# Patient Record
Sex: Male | Born: 1955 | Race: Black or African American | Hispanic: No | Marital: Single | Smoking: Former smoker
Health system: Southern US, Community
[De-identification: ages and names within clinical notes are randomized; demographics above are authoritative.]

## PROBLEM LIST (undated history)

## (undated) DIAGNOSIS — I5020 Unspecified systolic (congestive) heart failure: Secondary | ICD-10-CM

## (undated) DIAGNOSIS — J45909 Unspecified asthma, uncomplicated: Secondary | ICD-10-CM

## (undated) DIAGNOSIS — F1010 Alcohol abuse, uncomplicated: Secondary | ICD-10-CM

## (undated) DIAGNOSIS — D649 Anemia, unspecified: Secondary | ICD-10-CM

## (undated) DIAGNOSIS — M109 Gout, unspecified: Secondary | ICD-10-CM

## (undated) DIAGNOSIS — E785 Hyperlipidemia, unspecified: Secondary | ICD-10-CM

## (undated) DIAGNOSIS — Z720 Tobacco use: Secondary | ICD-10-CM

## (undated) DIAGNOSIS — K922 Gastrointestinal hemorrhage, unspecified: Secondary | ICD-10-CM

## (undated) DIAGNOSIS — I426 Alcoholic cardiomyopathy: Secondary | ICD-10-CM

## (undated) DIAGNOSIS — I428 Other cardiomyopathies: Secondary | ICD-10-CM

## (undated) DIAGNOSIS — M1990 Unspecified osteoarthritis, unspecified site: Secondary | ICD-10-CM

## (undated) DIAGNOSIS — I2510 Atherosclerotic heart disease of native coronary artery without angina pectoris: Secondary | ICD-10-CM

## (undated) DIAGNOSIS — D689 Coagulation defect, unspecified: Secondary | ICD-10-CM

## (undated) DIAGNOSIS — T7840XA Allergy, unspecified, initial encounter: Secondary | ICD-10-CM

## (undated) DIAGNOSIS — H547 Unspecified visual loss: Secondary | ICD-10-CM

## (undated) DIAGNOSIS — I10 Essential (primary) hypertension: Secondary | ICD-10-CM

## (undated) DIAGNOSIS — G459 Transient cerebral ischemic attack, unspecified: Secondary | ICD-10-CM

## (undated) DIAGNOSIS — I5022 Chronic systolic (congestive) heart failure: Secondary | ICD-10-CM

## (undated) DIAGNOSIS — H3412 Central retinal artery occlusion, left eye: Secondary | ICD-10-CM

## (undated) DIAGNOSIS — E119 Type 2 diabetes mellitus without complications: Secondary | ICD-10-CM

## (undated) DIAGNOSIS — J189 Pneumonia, unspecified organism: Secondary | ICD-10-CM

## (undated) HISTORY — PX: COLONOSCOPY: SHX174

## (undated) HISTORY — DX: Atherosclerotic heart disease of native coronary artery without angina pectoris: I25.10

## (undated) HISTORY — DX: Allergy, unspecified, initial encounter: T78.40XA

## (undated) HISTORY — DX: Coagulation defect, unspecified: D68.9

## (undated) HISTORY — DX: Transient cerebral ischemic attack, unspecified: G45.9

## (undated) HISTORY — DX: Unspecified systolic (congestive) heart failure: I50.20

## (undated) HISTORY — DX: Chronic systolic (congestive) heart failure: I50.22

## (undated) HISTORY — PX: CARDIAC CATHETERIZATION: SHX172

## (undated) HISTORY — DX: Essential (primary) hypertension: I10

---

## 1997-09-16 ENCOUNTER — Emergency Department (HOSPITAL_COMMUNITY): Admission: EM | Admit: 1997-09-16 | Discharge: 1997-09-16 | Payer: Self-pay | Admitting: Emergency Medicine

## 1997-11-01 ENCOUNTER — Emergency Department (HOSPITAL_COMMUNITY): Admission: EM | Admit: 1997-11-01 | Discharge: 1997-11-01 | Payer: Self-pay | Admitting: Emergency Medicine

## 1997-11-29 ENCOUNTER — Emergency Department (HOSPITAL_COMMUNITY): Admission: EM | Admit: 1997-11-29 | Discharge: 1997-11-29 | Payer: Self-pay | Admitting: Emergency Medicine

## 1998-01-20 ENCOUNTER — Emergency Department (HOSPITAL_COMMUNITY): Admission: EM | Admit: 1998-01-20 | Discharge: 1998-01-20 | Payer: Self-pay | Admitting: Emergency Medicine

## 1999-03-29 ENCOUNTER — Emergency Department (HOSPITAL_COMMUNITY): Admission: EM | Admit: 1999-03-29 | Discharge: 1999-03-29 | Payer: Self-pay | Admitting: Emergency Medicine

## 1999-04-20 ENCOUNTER — Emergency Department (HOSPITAL_COMMUNITY): Admission: EM | Admit: 1999-04-20 | Discharge: 1999-04-20 | Payer: Self-pay | Admitting: Emergency Medicine

## 1999-04-20 ENCOUNTER — Encounter: Payer: Self-pay | Admitting: Emergency Medicine

## 2001-09-10 ENCOUNTER — Emergency Department (HOSPITAL_COMMUNITY): Admission: EM | Admit: 2001-09-10 | Discharge: 2001-09-10 | Payer: Self-pay | Admitting: Emergency Medicine

## 2001-09-10 ENCOUNTER — Encounter: Payer: Self-pay | Admitting: Emergency Medicine

## 2001-09-18 ENCOUNTER — Emergency Department (HOSPITAL_COMMUNITY): Admission: EM | Admit: 2001-09-18 | Discharge: 2001-09-18 | Payer: Self-pay | Admitting: Emergency Medicine

## 2001-10-03 ENCOUNTER — Encounter: Admission: RE | Admit: 2001-10-03 | Discharge: 2001-11-14 | Payer: Self-pay | Admitting: Orthopedic Surgery

## 2002-02-04 ENCOUNTER — Emergency Department (HOSPITAL_COMMUNITY): Admission: EM | Admit: 2002-02-04 | Discharge: 2002-02-04 | Payer: Self-pay | Admitting: Emergency Medicine

## 2004-06-19 ENCOUNTER — Emergency Department (HOSPITAL_COMMUNITY): Admission: EM | Admit: 2004-06-19 | Discharge: 2004-06-19 | Payer: Self-pay | Admitting: Emergency Medicine

## 2004-07-12 ENCOUNTER — Emergency Department (HOSPITAL_COMMUNITY): Admission: EM | Admit: 2004-07-12 | Discharge: 2004-07-12 | Payer: Self-pay | Admitting: Emergency Medicine

## 2004-10-04 ENCOUNTER — Emergency Department (HOSPITAL_COMMUNITY): Admission: EM | Admit: 2004-10-04 | Discharge: 2004-10-04 | Payer: Self-pay | Admitting: Emergency Medicine

## 2005-03-28 ENCOUNTER — Emergency Department (HOSPITAL_COMMUNITY): Admission: EM | Admit: 2005-03-28 | Discharge: 2005-03-28 | Payer: Self-pay | Admitting: Emergency Medicine

## 2005-11-30 ENCOUNTER — Emergency Department (HOSPITAL_COMMUNITY): Admission: EM | Admit: 2005-11-30 | Discharge: 2005-11-30 | Payer: Self-pay | Admitting: Emergency Medicine

## 2007-01-30 ENCOUNTER — Emergency Department (HOSPITAL_COMMUNITY): Admission: EM | Admit: 2007-01-30 | Discharge: 2007-01-30 | Payer: Self-pay | Admitting: Emergency Medicine

## 2007-05-21 ENCOUNTER — Emergency Department (HOSPITAL_COMMUNITY): Admission: EM | Admit: 2007-05-21 | Discharge: 2007-05-21 | Payer: Self-pay | Admitting: Emergency Medicine

## 2007-08-26 ENCOUNTER — Emergency Department (HOSPITAL_COMMUNITY): Admission: EM | Admit: 2007-08-26 | Discharge: 2007-08-26 | Payer: Self-pay | Admitting: Emergency Medicine

## 2008-10-07 ENCOUNTER — Inpatient Hospital Stay (HOSPITAL_COMMUNITY): Admission: EM | Admit: 2008-10-07 | Discharge: 2008-10-10 | Payer: Self-pay | Admitting: Emergency Medicine

## 2008-10-07 ENCOUNTER — Ambulatory Visit: Payer: Self-pay | Admitting: Internal Medicine

## 2008-10-08 ENCOUNTER — Encounter: Payer: Self-pay | Admitting: Cardiology

## 2008-10-22 ENCOUNTER — Encounter (INDEPENDENT_AMBULATORY_CARE_PROVIDER_SITE_OTHER): Payer: Self-pay | Admitting: *Deleted

## 2008-11-30 DIAGNOSIS — N259 Disorder resulting from impaired renal tubular function, unspecified: Secondary | ICD-10-CM | POA: Insufficient documentation

## 2008-11-30 DIAGNOSIS — D509 Iron deficiency anemia, unspecified: Secondary | ICD-10-CM | POA: Insufficient documentation

## 2008-11-30 DIAGNOSIS — M109 Gout, unspecified: Secondary | ICD-10-CM | POA: Insufficient documentation

## 2008-11-30 DIAGNOSIS — R079 Chest pain, unspecified: Secondary | ICD-10-CM | POA: Insufficient documentation

## 2008-11-30 DIAGNOSIS — R7309 Other abnormal glucose: Secondary | ICD-10-CM | POA: Insufficient documentation

## 2008-11-30 DIAGNOSIS — E119 Type 2 diabetes mellitus without complications: Secondary | ICD-10-CM | POA: Insufficient documentation

## 2008-11-30 DIAGNOSIS — E785 Hyperlipidemia, unspecified: Secondary | ICD-10-CM | POA: Insufficient documentation

## 2008-12-02 ENCOUNTER — Telehealth (INDEPENDENT_AMBULATORY_CARE_PROVIDER_SITE_OTHER): Payer: Self-pay | Admitting: *Deleted

## 2008-12-31 ENCOUNTER — Encounter (INDEPENDENT_AMBULATORY_CARE_PROVIDER_SITE_OTHER): Payer: Self-pay | Admitting: *Deleted

## 2009-03-02 ENCOUNTER — Emergency Department (HOSPITAL_COMMUNITY): Admission: EM | Admit: 2009-03-02 | Discharge: 2009-03-02 | Payer: Self-pay | Admitting: Emergency Medicine

## 2010-06-27 LAB — DIFFERENTIAL
Basophils Relative: 1 % (ref 0–1)
Eosinophils Absolute: 0.2 10*3/uL (ref 0.0–0.7)
Eosinophils Relative: 3 % (ref 0–5)
Lymphocytes Relative: 16 % (ref 12–46)
Lymphs Abs: 1.3 10*3/uL (ref 0.7–4.0)
Monocytes Absolute: 0.5 10*3/uL (ref 0.1–1.0)
Monocytes Relative: 6 % (ref 3–12)
Neutro Abs: 6 10*3/uL (ref 1.7–7.7)
Neutrophils Relative %: 74 % (ref 43–77)

## 2010-06-27 LAB — COMPREHENSIVE METABOLIC PANEL
AST: 44 U/L — ABNORMAL HIGH (ref 0–37)
Alkaline Phosphatase: 96 U/L (ref 39–117)
Chloride: 100 mEq/L (ref 96–112)
GFR calc Af Amer: >60 mL/min (ref >60)
Potassium: 4.7 mEq/L (ref 3.5–5.1)
Sodium: 136 mEq/L (ref 135–145)

## 2010-06-27 LAB — CBC
HCT: 37.7 % — ABNORMAL LOW (ref 39.0–52.0)
MCHC: 34.2 g/dL (ref 30.0–36.0)
MCV: 99 fL (ref 78.0–100.0)
RBC: 3.81 MIL/uL — ABNORMAL LOW (ref 4.22–5.81)
RDW: 17 % — ABNORMAL HIGH (ref 11.5–15.5)
WBC: 8.1 10*3/uL (ref 4.0–10.5)

## 2010-06-27 LAB — URINALYSIS, ROUTINE W REFLEX MICROSCOPIC
Bilirubin Urine: NEGATIVE
Glucose, UA: NEGATIVE mg/dL
Protein, ur: NEGATIVE mg/dL
pH: 7.5 (ref 5.0–8.0)

## 2010-06-27 LAB — GLUCOSE, CAPILLARY: Glucose-Capillary: 138 mg/dL — ABNORMAL HIGH (ref 70–99)

## 2010-06-27 LAB — URINE CULTURE: Culture: NO GROWTH

## 2010-06-27 LAB — PROTIME-INR: Prothrombin Time: 12.9 seconds (ref 11.6–15.2)

## 2010-07-02 LAB — CBC
HCT: 33.6 % — ABNORMAL LOW (ref 39.0–52.0)
MCV: 102.2 fL — ABNORMAL HIGH (ref 78.0–100.0)
Platelets: 213 10*3/uL (ref 150–400)
RDW: 17 % — ABNORMAL HIGH (ref 11.5–15.5)

## 2010-07-02 LAB — COMPREHENSIVE METABOLIC PANEL
Albumin: 3.3 g/dL — ABNORMAL LOW (ref 3.5–5.2)
BUN: 11 mg/dL (ref 6–23)
Creatinine, Ser: 0.86 mg/dL (ref 0.4–1.5)
Potassium: 4.2 mEq/L (ref 3.5–5.1)
Total Protein: 6.3 g/dL (ref 6.0–8.3)

## 2010-07-02 LAB — GLUCOSE, CAPILLARY
Glucose-Capillary: 117 mg/dL — ABNORMAL HIGH (ref 70–99)
Glucose-Capillary: 122 mg/dL — ABNORMAL HIGH (ref 70–99)
Glucose-Capillary: 134 mg/dL — ABNORMAL HIGH (ref 70–99)
Glucose-Capillary: 145 mg/dL — ABNORMAL HIGH (ref 70–99)
Glucose-Capillary: 145 mg/dL — ABNORMAL HIGH (ref 70–99)
Glucose-Capillary: 166 mg/dL — ABNORMAL HIGH (ref 70–99)

## 2010-07-02 LAB — CARDIAC PANEL(CRET KIN+CKTOT+MB+TROPI)
CK, MB: 0.8 ng/mL (ref 0.3–4.0)
Relative Index: INVALID (ref 0.0–2.5)
Troponin I: 0.02 ng/mL (ref 0.00–0.06)
Troponin I: 0.02 ng/mL (ref 0.00–0.06)

## 2010-07-02 LAB — LIPID PANEL
HDL: 63 mg/dL (ref >39)
Triglycerides: 451 mg/dL — ABNORMAL HIGH (ref <150)

## 2010-07-03 LAB — DIFFERENTIAL
Basophils Absolute: 0 10*3/uL (ref 0.0–0.1)
Lymphocytes Relative: 20 % (ref 12–46)
Monocytes Absolute: 0.4 10*3/uL (ref 0.1–1.0)
Monocytes Relative: 6 % (ref 3–12)
Neutro Abs: 5.2 10*3/uL (ref 1.7–7.7)

## 2010-07-03 LAB — COMPREHENSIVE METABOLIC PANEL
Albumin: 4.5 g/dL (ref 3.5–5.2)
BUN: 20 mg/dL (ref 6–23)
Creatinine, Ser: 1.52 mg/dL — ABNORMAL HIGH (ref 0.4–1.5)
Total Bilirubin: 0.9 mg/dL (ref 0.3–1.2)
Total Protein: 8.1 g/dL (ref 6.0–8.3)

## 2010-07-03 LAB — CK TOTAL AND CKMB (NOT AT ARMC)
CK, MB: 1.1 ng/mL (ref 0.3–4.0)
Relative Index: 1 (ref 0.0–2.5)
Total CK: 107 U/L (ref 7–232)

## 2010-07-03 LAB — POCT CARDIAC MARKERS
CKMB, poc: 1.1 ng/mL (ref 1.0–8.0)
Myoglobin, poc: 120 ng/mL (ref 12–200)
Myoglobin, poc: 72.7 ng/mL (ref 12–200)

## 2010-07-03 LAB — IRON AND TIBC
Iron: 31 ug/dL — ABNORMAL LOW (ref 42–135)
TIBC: 282 ug/dL (ref 215–435)

## 2010-07-03 LAB — CBC
HCT: 39.1 % (ref 39.0–52.0)
MCV: 103 fL — ABNORMAL HIGH (ref 78.0–100.0)
Platelets: 224 10*3/uL (ref 150–400)
RDW: 17.2 % — ABNORMAL HIGH (ref 11.5–15.5)

## 2010-07-03 LAB — TROPONIN I: Troponin I: 0.02 ng/mL (ref 0.00–0.06)

## 2010-07-03 LAB — FERRITIN: Ferritin: 1332 ng/mL — ABNORMAL HIGH (ref 22–322)

## 2010-07-03 LAB — RETICULOCYTES
RBC.: 3.65 MIL/uL — ABNORMAL LOW (ref 4.22–5.81)
Retic Count, Absolute: 18.3 10*3/uL — ABNORMAL LOW (ref 19.0–186.0)
Retic Ct Pct: 0.5 % (ref 0.4–3.1)

## 2010-08-08 NOTE — Consult Note (Signed)
NAMELEIGHLAND, MISFELDT NO.:  [id number]   MEDICAL RECORD NO.:  [id number]          PATIENT TYPE:  INP   LOCATION:  [id number]                         FACILITY:  [facility]   PHYSICIAN:  Thompson Grayer, MD       DATE OF BIRTH:  21-Aug-1955   DATE OF CONSULTATION:  DATE OF DISCHARGE:                                 CONSULTATION   CONSULTING PHYSICIAN:  Hospitalist Service.   REASON FOR CONSULTATION:  Chest pain.   HISTORY OF PRESENT ILLNESS:  Mr. Viegas is a pleasant 55 year old  gentleman without significant prior medical history who presents with  chest and epigastric discomforts.  The patient has a long-standing  history of tobacco and significant alcohol consumption, but denies prior  hypertension, hyperlipidemia, and diabetes.  He denies any prior cardiac  history.  He notes that last evening that he developed a epigastric  chest discomfort after eating ravioli.  He took Tums and notes that his  pain resolved.  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  He reports associated blurred vision and headache.  His  symptoms persisted for approximately an hour before gradually subsiding.  He presented to [facility] for further evaluation.  Presently, he is resting comfortably and without complaint.  He denies  chest pain presently, recent shortness of breath, palpitations,  presyncope, syncope, or other symptoms.   PAST MEDICAL HISTORY:  1. Longstanding tobacco use.  2. Heavy alcohol consumption with intermittent symptoms of withdrawal.   PAST SURGICAL HISTORY:  None.   MEDICATIONS:  None.   ALLERGIES:  None.   SOCIAL HISTORY:  The patient lives in [place] with his nephew.  He is  a Horticulturist, commercial.  He smokes a pack per day and has done so for many years.  He drinks several beers per day and drinks wine and whiskey on the  weekends.  He denies drug use.   FAMILY HISTORY:  Notable for diabetes.  He is unaware of any family  history of early coronary artery disease.   REVIEW OF SYSTEMS:  All systems are reviewed and negative except as  outlined in the HPI above.   PHYSICAL EXAMINATION:  VITAL SIGNS:  Blood pressure 124/93, heart rate  100, respirations 18, sats 100% on room air, and afebrile.  Telemetry  reveals sinus rhythm.  GENERAL:  The patient is anxious-appearing male in no acute distress.  He is alert and oriented x3.  HEENT:  Normocephalic and atraumatic.  Sclerae clear.  Conjunctivae  pink.  Oropharynx is clear.  NECK:  Supple.  No thyromegaly, JVD, or bruits.  LUNGS:  Clear to auscultation bilaterally with a mildly prolonged  expiratory phase.  HEART:  Regular rate and rhythm.  Bilaterally displaced PMI.  No  murmurs, rubs, or gallops.  GI:  Soft, nontender, and nondistended.  Positive bowel sounds.  EXTREMITIES:  No clubbing, cyanosis, or edema.  NEUROLOGIC:  Strength and sensation are intact.  SKIN:  No ecchymosis or laceration.  MUSCULOSKELETAL:  No deformity  or atrophy.  PSYCH:  Anxious-appearing mood.  Full affect.   EKG today reveals sinus tachycardia at 110 beats per minute with T-wave  inversions along the inferior leads as well as V5 and V6.  These changes  are very similar to an EKG from November 30, 2005.   Chest x-ray, I have reviewed the patient's chest x-ray, which appears to  be normal at this time.   LABORATORY DATA:  Troponin 0.02.  CK-MB 1.1, CK 107, lipase 40, and  creatinine 1.5.  Hematocrit 39, MCV 103, white blood cell count 7, and  platelets 224.   IMPRESSION:  Mr. Fadely is a very pleasant 55 year old gentleman who  presents with epigastric and substernal chest discomfort earlier today,  which has now resolved.  He reports mild nausea with these episodes and  notes that his episode last evening terminated with Tums.  He is  presently resting comfortably.  The etiology of his chest discomfort and   epigastric pain is unknown.  Given his heavy history of alcohol  consumption, I am concerned that this may be gastrointestinal in  etiology.  I will therefore recommend placing the patient on a proton  pump inhibitor.  The patient has been admitted to the hospitalist  service and will be observed overnight on telemetry with serial cardiac  markers.  If he has no evidence of subendocardial ischemia, then I would  recommend a GXT Myoview.  If the patient rules in for myocardial  infarction, then I will consider left heart catheterization after his  renal function has been optimized.  The importance of cessation of  tobacco and alcohol was discussed in detail with the patient today.  We  will obtain a fasting lipid profile and hemoglobin A1c for further risk  stratification.      Thompson Grayer, MD  Electronically Signed     JA/MEDQ  D:  10/07/2008  T:  10/08/2008  Job:  [id number]

## 2010-08-08 NOTE — Discharge Summary (Signed)
Travis [name], Travis [name] NO.:  [id number]   MEDICAL RECORD NO.:  [id number]          PATIENT TYPE:  INP   LOCATION:  3709                         FACILITY:  [facility]   PHYSICIAN:  Rexene Alberts, M.D.    DATE OF BIRTH:  [birth year]   DATE OF ADMISSION:  10/07/2008  DATE OF DISCHARGE:  10/10/2008                               DISCHARGE SUMMARY   DISCHARGE DIAGNOSES:  1. Chest pain with EKG changes.  The patient ruled out for myocardial      infarction.  Lateral T-wave changes seen on the EKG noted.  2. Abnormal Myoview stress test performed on October 10, 2008.  The      results were significant for a fixed defect along the inferior wall      suggestive of an infarct.  There was hypokinesia along the inferior      wall and the apex.  There was no evidence for a reversible defect      or ischemia.  Ejection fraction measured 40%.  3. Per the 2-D echocardiogram performed on October 08, 2008, there was      diastolic dysfunction, mild to moderate LVH, normal wall motion of      the left ventricle, and an ejection fraction of 65%.  4. Hyperlipidemia.  5. Newly diagnosed type 2 diabetes mellitus.  6. Macrocytosis secondary to alcohol abuse.  Vitamin B12 and folate      levels were within normal limits.  7. Elevated SGOT, likely secondary to alcohol abuse.  8. Acute renal insufficiency, completely resolved.  The patient's      creatinine was 1.52 on admission and prior to hospital discharge,      it improved to 0.86 with IV fluids.  9. Tobacco and alcohol abuse.   DISCHARGE MEDICATIONS:  1. Glipizide 5 mg half a tablet daily.  2. Pravastatin 20 mg daily.  3. Sublingual nitroglycerin 1 tablet under the tongue as needed every      5 minutes x3 for chest pain.  4. Aspirin 81 mg daily.   DISCHARGE DISPOSITION:  The patient is being discharged to home in  improved and stable condition.  Cardiologist, Dr. Percival Spanish stated that  the [facility] will call him for a  followup appointment.  The patient was given information on the [facility] and he was  advised to call to make an appointment to be followed up in 2-4 weeks.   CONSULTATIONS:  [facility] cardiologists, Dr. Rayann Heman and Dr. Percival Spanish.   PROCEDURES PERFORMED:  1. Myoview stress test on October 10, 2008.  The results are indicated      above.  In addition, during the stress test, there was no evidence      of ST or T-wave changes indicative of ischemia.  2. A 2-D echocardiogram performed on October 08, 2008.  The results are      indicated above.  3. Chest x-ray on October 07, 2008.  The results revealed no evidence of      cardiopulmonary disease acutely.   HISTORY OF PRESENT ILLNESS:  The patient is a 55 year old man  with a  past medical history significant for tobacco and alcohol use, remote  history of an upper GI bleed, and degenerative joint disease, who  presented to the emergency department on October 07, 2008 with a chief  complaint of chest pain while at work.  The patient is employed as a  Horticulturist, commercial.  When he was evaluated in the emergency department, he was  noted to be afebrile and hemodynamically stable.  His initial cardiac  markers were negative.  His blood lipase was within normal limits at 40.  His EKG revealed sinus tachycardia with a heart rate of 107 beats per  minute, LVH, and lateral T-wave abnormalities.  His chest x-ray revealed  no acute cardiopulmonary disease.  He was admitted for further  evaluation and management.   For additional details, please see the dictated history and physical.   HOSPITAL COURSE:  1. CHEST PAIN, ABNORMAL EKG, ABNORMAL STRESS TEST, AND HYPERLIPIDEMIA.      The patient was started empirically on nitroglycerin paste,      aspirin, and prophylactic Protonix.  In addition, his pain was      treated with as needed Percocet.  The patient had no prior history      of hypertension.  His blood pressures remained controlled      throughout the  hospitalization.  For further evaluation, a number      of studies were ordered including cardiac enzymes, fasting lipid      panel, TSH, and 2-D echocardiogram.  The cardiac enzymes were      completely normal.  His TSH was within normal limits at 0.686.  His      fasting lipid profile revealed a total cholesterol of 205,      triglycerides of 451, and HDL cholesterol of 63.  The LDL      cholesterol was not calculated.  He was started on Zocor daily.      His 2-D echocardiogram revealed preserved LV function with an      ejection fraction of 65%.;  mild to moderate LVH; however, the left      ventricular wall motion was normal; there was no evidence of      regional wall motion abnormalities.  Subsequently, cardiologist,      Dr. Rayann Heman was consulted.  He provided his assessment shortly after      hospital admission.  Per his impression, the patient's discomfort      may have been gastrointestinal in origin.  He recommended obtaining      a Myoview stress test if there was no evidence of subendocardial      ischemia with negative cardiac enzymes.  However, if the patient      ruled in for a myocardial infarction, a left heart catheterization      would be considered.  He noted that the patient's renal function      was abnormal and once it improved, a catheterization would      potentially be pursued.  As above, the patient ruled out for a      myocardial infarction.  Therefore, a Myoview stress test was      ordered and it was performed today.  The results are indicated      above.  Given these findings today, I discussed the patient's      disposition with cardiologist, Dr. Percival Spanish.  He reviewed the      stress test and stated that there was no evidence of reversible  ischemia and therefore, the patient could be safely discharged to      home.  Dr. Percival Spanish also stated that the office would call the      patient for a followup assessment with Dr. Rayann Heman in the next 1-2       weeks.  The patient was given the information.  2. MACROCYTIC ANEMIA, MILD TRANSAMINITIS, TOBACCO ABUSE, AND ALCOHOL      ABUSE.  At the time of the initial hospital assessment, the      patient's SGOT was elevated at 74.  His hemoglobin was 13.6 and his      MCV was 103.  His hemoglobin fell slightly to 11.7.  His SGOT      improved slightly to 68.  The patient admitted drinking one six-      pack or more of beer daily and smoking at least one pack of      cigarettes daily.  He was strongly advised to stop both.  He was      offered tobacco cessation counseling and services to assist with      alcohol cessation.  The patient vowed to cut down on his tobacco      and alcohol use; however, he did say that it will be difficult for      him to stop both completely at the same time.  He was treated      empirically with an Ativan taper as well as vitamin therapy.  A      nicotine patch was placed as well.  There were no overt signs of      alcohol withdrawal during the hospital course.  3. NEWLY DIAGNOSED TYPE 2 DIABETES MELLITUS.  The patient's venous      glucose was consistently elevated during the hospitalization.  A      hemoglobin A1c was ordered and it was 5.7.  His capillary blood      glucose was monitored throughout the hospitalization.  Given the      persistent elevation, glipizide was started at      2.5 mg daily.  The registered nurse and registered dietician were      consulted for diabetes management.  He was advised to check and      monitor his blood glucose at least twice daily at home.  Further      management will be deferred to his new primary care physician at      the [facility].      Rexene Alberts, M.D.  Electronically Signed     DF/MEDQ  D:  10/10/2008  T:  10/11/2008  Job:  [id number]   cc:   Myriam Jacobson  Thompson Grayer, MD

## 2010-08-08 NOTE — H&P (Signed)
Travis [name], Travis [name] NO.:  [id number]   MEDICAL RECORD NO.:  [id number]          PATIENT TYPE:  EMS   LOCATION:  MAJO                         FACILITY:  [facility]   PHYSICIAN:  Rise Patience, MDDATE OF BIRTH:  Dec 04, 1955   DATE OF ADMISSION:  10/07/2008  DATE OF DISCHARGE:                              HISTORY & PHYSICAL   PRIMARY CARE PHYSICIAN:  Unassigned.   CHIEF COMPLAINT:  Chest pain.   HISTORY OF PRESENT ILLNESS:  A 55 year old male with history of ongoing  tobacco abuse presented to the ER complaining of chest pain.  The  patient started experiencing chest pain after work yesterday at around 6  p.m.  The patient is a Horticulturist, commercial.  The pain is inferior to sternum  sometimes radiating to left arm.  Initially had some sweats along with  the chest pain.  Denies any shortness of breath.  Denies any nausea or  vomiting, diarrhea, fever or chills, headache or loss of consciousness.  The patient's chest pain resolved itself yesterday after about half an  hour.  This morning when he went to work again he started experiencing  chest pain.  He went back home and took a bath to come to the ER.  The  pain was slowly easing up.  was given nitroglycerin.  Chest pain has  presently resolved.  He has been admitted for further evaluation.   PAST MEDICAL HISTORY:  Nothing significant.   PAST SURGICAL HISTORY:  Had an EGD 15 years ago for GI bleed.  He says  an artery was busted.   ALLERGIES:  No known drug allergies.   SOCIAL HISTORY:  The patient lives at home.  Smokes cigarettes.  Has  been advised to quit smoking.  Drinks alcohol alternate days.  He was  advised to quit drinking alcohol also.  Denies any drug abuse.   FAMILY HISTORY:  Significant for diabetes.   REVIEW OF SYSTEMS:  As per history of present illness.  Nothing else  significant.   PHYSICAL EXAMINATION:  GENERAL:  The patient was examined at bedside.  Not in acute distress.  VITAL SIGNS:   Blood pressure is 120/80, pulse 100 per minute,  temperature 98.3, respirations 18 per minute, O2 saturation 98%.  HEENT:  Anicteric.  No pallor.  CHEST:  Bilateral air entry present.  No rhonchi.  No crepitation.  HEART:  S1, S2, heard.  ABDOMEN:  Soft, nontender.  Bowel sounds heard.  CNS:  Alert, awake, oriented to time, place and person.  Moves upper and  lower limbs 5/5.  EXTREMITIES:  Peripheral pulses felt.  No edema.   LABS:  CBC - WBC 7.3, hemoglobin 13.6, hematocrit 39.1, MCV 103,  platelets 224.  Basic metabolic panel - sodium Q000111Q, potassium 3.5,  chloride 99, carbon dioxide 23, glucose 140, BUN 20, creatinine 1.5,  bilirubin 0.9, alkaline phosphatase 99, AST 34, ALT 32, total protein  8.1, albumin 4.5, calcium 10.1, lipase 40, troponin I less than 0.05.  EKG shows normal sinus rhythm with inferior lateral T wave changes.  Chest x-ray nothing acute.  ASSESSMENT:  1. Chest pain, rule out acute coronary syndrome.  2. Hyperglycemia.  3. Ongoing tobacco abuse.  4. Chronic alcoholism.   PLAN:  1. Will place him on telemetry.  2. Will cycle cardiac markers.  3. Will get a urine drug screen.  4. Advised the patient to stop smoking and drinking alcohol.  5. I have already consulted [facility] and will follow their      recommendations.      Rise Patience, MD  Electronically Signed     ANK/MEDQ  D:  10/07/2008  T:  10/07/2008  Job:  [id number]

## 2010-08-14 ENCOUNTER — Emergency Department: Payer: Self-pay | Admitting: Emergency Medicine

## 2010-11-10 ENCOUNTER — Emergency Department: Payer: Self-pay | Admitting: Emergency Medicine

## 2010-12-01 ENCOUNTER — Emergency Department: Payer: Self-pay | Admitting: Emergency Medicine

## 2010-12-04 ENCOUNTER — Emergency Department (HOSPITAL_COMMUNITY)
Admission: EM | Admit: 2010-12-04 | Discharge: 2010-12-04 | Disposition: A | Discharge status: Home / Self Care | Payer: Self-pay | Attending: Emergency Medicine | Admitting: Emergency Medicine

## 2010-12-04 DIAGNOSIS — M1990 Unspecified osteoarthritis, unspecified site: Secondary | ICD-10-CM | POA: Insufficient documentation

## 2010-12-04 DIAGNOSIS — M25469 Effusion, unspecified knee: Secondary | ICD-10-CM | POA: Insufficient documentation

## 2010-12-04 DIAGNOSIS — I2510 Atherosclerotic heart disease of native coronary artery without angina pectoris: Secondary | ICD-10-CM | POA: Insufficient documentation

## 2010-12-04 LAB — PROTEIN, BODY FLUID

## 2010-12-04 LAB — SYNOVIAL CELL COUNT + DIFF, W/ CRYSTALS
Lymphocytes-Synovial Fld: 12 % (ref 0–20)
Neutrophil, Synovial: 88 % — ABNORMAL HIGH (ref 0–25)
Other Cells-SYN: 0
WBC, Synovial: 6389 /mm3 — ABNORMAL HIGH (ref 0–200)

## 2010-12-04 LAB — GRAM STAIN

## 2010-12-08 LAB — BODY FLUID CULTURE: Culture: NO GROWTH

## 2010-12-18 LAB — CBC
Hemoglobin: 11.9 — ABNORMAL LOW
MCHC: 33.6
RBC: 3.42 — ABNORMAL LOW
WBC: 8

## 2010-12-18 LAB — DIFFERENTIAL
Basophils Absolute: 0
Basophils Relative: 0
Eosinophils Absolute: 0
Eosinophils Relative: 1
Lymphocytes Relative: 15
Lymphs Abs: 1.2
Monocytes Absolute: 0.3
Monocytes Relative: 4
Neutro Abs: 6.4
Neutrophils Relative %: 80 — ABNORMAL HIGH

## 2010-12-18 LAB — URIC ACID: Uric Acid, Serum: 8.8 — ABNORMAL HIGH

## 2010-12-18 LAB — SEDIMENTATION RATE: Sed Rate: 67 — ABNORMAL HIGH

## 2010-12-21 ENCOUNTER — Emergency Department (HOSPITAL_COMMUNITY)
Admission: EM | Admit: 2010-12-21 | Discharge: 2010-12-21 | Disposition: A | Discharge status: Home / Self Care | Payer: Medicaid Other | Attending: Emergency Medicine | Admitting: Emergency Medicine

## 2010-12-21 ENCOUNTER — Emergency Department (HOSPITAL_COMMUNITY): Payer: Medicaid Other

## 2010-12-21 DIAGNOSIS — E119 Type 2 diabetes mellitus without complications: Secondary | ICD-10-CM | POA: Insufficient documentation

## 2010-12-21 DIAGNOSIS — R262 Difficulty in walking, not elsewhere classified: Secondary | ICD-10-CM | POA: Insufficient documentation

## 2010-12-21 DIAGNOSIS — M546 Pain in thoracic spine: Secondary | ICD-10-CM | POA: Insufficient documentation

## 2010-12-21 DIAGNOSIS — M25569 Pain in unspecified knee: Secondary | ICD-10-CM | POA: Insufficient documentation

## 2010-12-21 DIAGNOSIS — M545 Low back pain, unspecified: Secondary | ICD-10-CM | POA: Insufficient documentation

## 2010-12-21 DIAGNOSIS — M1710 Unilateral primary osteoarthritis, unspecified knee: Secondary | ICD-10-CM | POA: Insufficient documentation

## 2010-12-21 DIAGNOSIS — E785 Hyperlipidemia, unspecified: Secondary | ICD-10-CM | POA: Insufficient documentation

## 2010-12-21 DIAGNOSIS — I2510 Atherosclerotic heart disease of native coronary artery without angina pectoris: Secondary | ICD-10-CM | POA: Insufficient documentation

## 2010-12-21 DIAGNOSIS — R609 Edema, unspecified: Secondary | ICD-10-CM | POA: Insufficient documentation

## 2010-12-21 DIAGNOSIS — M7989 Other specified soft tissue disorders: Secondary | ICD-10-CM | POA: Insufficient documentation

## 2010-12-21 DIAGNOSIS — M25469 Effusion, unspecified knee: Secondary | ICD-10-CM | POA: Insufficient documentation

## 2010-12-21 LAB — POCT I-STAT, CHEM 8
Creatinine, Ser: 0.8 mg/dL (ref 0.50–1.35)
Glucose, Bld: 89 mg/dL (ref 70–99)
HCT: 34 % — ABNORMAL LOW (ref 39.0–52.0)
Hemoglobin: 11.6 g/dL — ABNORMAL LOW (ref 13.0–17.0)
Potassium: 3.8 mEq/L (ref 3.5–5.1)
Sodium: 136 mEq/L (ref 135–145)
TCO2: 24 mmol/L (ref 0–100)

## 2010-12-21 LAB — COMPREHENSIVE METABOLIC PANEL
Alkaline Phosphatase: 143 — ABNORMAL HIGH
BUN: 10
CO2: 27
Chloride: 96
Creatinine, Ser: 0.95
GFR calc non Af Amer: >60
Glucose, Bld: 150 — ABNORMAL HIGH
Potassium: 3.6
Total Bilirubin: 2.1 — ABNORMAL HIGH

## 2010-12-21 LAB — DIFFERENTIAL
Basophils Absolute: 0.1
Eosinophils Absolute: 0.1
Lymphocytes Relative: 26
Monocytes Relative: 2 — ABNORMAL LOW
Neutro Abs: 3.6

## 2010-12-21 LAB — CBC
HCT: 37.5 — ABNORMAL LOW
Hemoglobin: 13.2
MCV: 97.6
RBC: 3.84 — ABNORMAL LOW
WBC: 5.3

## 2010-12-27 ENCOUNTER — Emergency Department (HOSPITAL_COMMUNITY): Payer: Medicaid Other

## 2010-12-27 ENCOUNTER — Inpatient Hospital Stay (HOSPITAL_COMMUNITY)
Admission: EM | Admit: 2010-12-27 | Discharge: 2010-12-31 | DRG: 287 | Disposition: A | Discharge status: Home / Self Care | Payer: Medicaid Other | Attending: Internal Medicine | Admitting: Internal Medicine

## 2010-12-27 DIAGNOSIS — I2510 Atherosclerotic heart disease of native coronary artery without angina pectoris: Secondary | ICD-10-CM | POA: Diagnosis present

## 2010-12-27 DIAGNOSIS — R0609 Other forms of dyspnea: Secondary | ICD-10-CM | POA: Diagnosis present

## 2010-12-27 DIAGNOSIS — D696 Thrombocytopenia, unspecified: Secondary | ICD-10-CM | POA: Diagnosis present

## 2010-12-27 DIAGNOSIS — F17200 Nicotine dependence, unspecified, uncomplicated: Secondary | ICD-10-CM | POA: Diagnosis present

## 2010-12-27 DIAGNOSIS — E785 Hyperlipidemia, unspecified: Secondary | ICD-10-CM | POA: Diagnosis present

## 2010-12-27 DIAGNOSIS — D649 Anemia, unspecified: Secondary | ICD-10-CM | POA: Diagnosis present

## 2010-12-27 DIAGNOSIS — E876 Hypokalemia: Secondary | ICD-10-CM | POA: Diagnosis not present

## 2010-12-27 DIAGNOSIS — F1010 Alcohol abuse, uncomplicated: Secondary | ICD-10-CM | POA: Diagnosis present

## 2010-12-27 DIAGNOSIS — Z91199 Patient's noncompliance with other medical treatment and regimen due to unspecified reason: Secondary | ICD-10-CM

## 2010-12-27 DIAGNOSIS — I509 Heart failure, unspecified: Secondary | ICD-10-CM | POA: Diagnosis present

## 2010-12-27 DIAGNOSIS — I5021 Acute systolic (congestive) heart failure: Principal | ICD-10-CM | POA: Diagnosis present

## 2010-12-27 DIAGNOSIS — R0989 Other specified symptoms and signs involving the circulatory and respiratory systems: Secondary | ICD-10-CM | POA: Diagnosis present

## 2010-12-27 DIAGNOSIS — E119 Type 2 diabetes mellitus without complications: Secondary | ICD-10-CM | POA: Diagnosis present

## 2010-12-27 DIAGNOSIS — I428 Other cardiomyopathies: Secondary | ICD-10-CM | POA: Diagnosis present

## 2010-12-27 DIAGNOSIS — F1210 Cannabis abuse, uncomplicated: Secondary | ICD-10-CM | POA: Diagnosis present

## 2010-12-27 DIAGNOSIS — M1990 Unspecified osteoarthritis, unspecified site: Secondary | ICD-10-CM | POA: Diagnosis present

## 2010-12-27 DIAGNOSIS — M25569 Pain in unspecified knee: Secondary | ICD-10-CM | POA: Diagnosis present

## 2010-12-27 DIAGNOSIS — Z9119 Patient's noncompliance with other medical treatment and regimen: Secondary | ICD-10-CM

## 2010-12-27 LAB — PROTIME-INR
INR: 1.11 (ref 0.00–1.49)
INR: 1.04 (ref 0.00–1.49)
Prothrombin Time: 14.5 seconds (ref 11.6–15.2)

## 2010-12-27 LAB — DIFFERENTIAL
Basophils Absolute: 0 10*3/uL (ref 0.0–0.1)
Basophils Relative: 0 % (ref 0–1)
Basophils Relative: 0 % (ref 0–1)
Eosinophils Absolute: 0.3 10*3/uL (ref 0.0–0.7)
Eosinophils Absolute: 0.3 10*3/uL (ref 0.0–0.7)
Lymphs Abs: 2.4 10*3/uL (ref 0.7–4.0)
Monocytes Absolute: 0.2 10*3/uL (ref 0.1–1.0)
Monocytes Absolute: 0.3 10*3/uL (ref 0.1–1.0)
Monocytes Relative: 3 % (ref 3–12)
Monocytes Relative: 4 % (ref 3–12)
Neutro Abs: 4.2 10*3/uL (ref 1.7–7.7)
Neutrophils Relative %: 64 % (ref 43–77)
Neutrophils Relative %: 63 % (ref 43–77)

## 2010-12-27 LAB — CBC
HCT: 27.5 % — ABNORMAL LOW (ref 39.0–52.0)
MCH: 28.9 pg (ref 26.0–34.0)
MCH: 30.2 pg (ref 26.0–34.0)
MCHC: 31.4 g/dL (ref 30.0–36.0)
MCHC: 32.6 g/dL (ref 30.0–36.0)
MCHC: 32.7 g/dL (ref 30.0–36.0)
MCV: 92.4 fL (ref 78.0–100.0)
MCV: 92 fL (ref 78.0–100.0)
Platelets: 490 10*3/uL — ABNORMAL HIGH (ref 150–400)
Platelets: 565 10*3/uL — ABNORMAL HIGH (ref 150–400)
Platelets: 475 10*3/uL — ABNORMAL HIGH (ref 150–400)
RBC: 3.15 MIL/uL — ABNORMAL LOW (ref 4.22–5.81)
RDW: 18.7 % — ABNORMAL HIGH (ref 11.5–15.5)
RDW: 18.6 % — ABNORMAL HIGH (ref 11.5–15.5)
WBC: 7 10*3/uL (ref 4.0–10.5)

## 2010-12-27 LAB — OCCULT BLOOD, POC DEVICE: Fecal Occult Bld: NEGATIVE

## 2010-12-27 LAB — BASIC METABOLIC PANEL
BUN: 11 mg/dL (ref 6–23)
Calcium: 9.6 mg/dL (ref 8.4–10.5)
Creatinine, Ser: 0.57 mg/dL (ref 0.50–1.35)
GFR calc Af Amer: >90 mL/min (ref >90)
GFR calc non Af Amer: >90 mL/min (ref >90)

## 2010-12-27 LAB — POCT I-STAT TROPONIN I: Troponin i, poc: 0.01 ng/mL (ref 0.00–0.08)

## 2010-12-27 LAB — TYPE AND SCREEN
ABO/RH(D): O POS
Antibody Screen: NEGATIVE

## 2010-12-27 LAB — PRO B NATRIURETIC PEPTIDE: Pro B Natriuretic peptide (BNP): 2736 pg/mL — ABNORMAL HIGH (ref 0–125)

## 2010-12-28 DIAGNOSIS — R0602 Shortness of breath: Secondary | ICD-10-CM

## 2010-12-28 DIAGNOSIS — I5021 Acute systolic (congestive) heart failure: Secondary | ICD-10-CM

## 2010-12-28 LAB — DIFFERENTIAL
Basophils Absolute: 0 10*3/uL (ref 0.0–0.1)
Basophils Absolute: 0 10*3/uL (ref 0.0–0.1)
Basophils Absolute: 0 10*3/uL (ref 0.0–0.1)
Basophils Relative: 0 % (ref 0–1)
Basophils Relative: 0 % (ref 0–1)
Basophils Relative: 0 % (ref 0–1)
Eosinophils Absolute: 0.4 10*3/uL (ref 0.0–0.7)
Eosinophils Absolute: 0.3 10*3/uL (ref 0.0–0.7)
Eosinophils Absolute: 0.2 10*3/uL (ref 0.0–0.7)
Eosinophils Relative: 5 % (ref 0–5)
Eosinophils Relative: 4 % (ref 0–5)
Eosinophils Relative: 4 % (ref 0–5)
Lymphocytes Relative: 35 % (ref 12–46)
Monocytes Absolute: 0.4 10*3/uL (ref 0.1–1.0)
Monocytes Absolute: 0.4 10*3/uL (ref 0.1–1.0)
Monocytes Relative: 6 % (ref 3–12)
Monocytes Relative: 6 % (ref 3–12)
Monocytes Relative: 8 % (ref 3–12)
Neutro Abs: 3.9 10*3/uL (ref 1.7–7.7)
Neutro Abs: 2.4 10*3/uL (ref 1.7–7.7)
Neutrophils Relative %: 44 % (ref 43–77)

## 2010-12-28 LAB — CBC
HCT: 28.6 % — ABNORMAL LOW (ref 39.0–52.0)
Hemoglobin: 9.1 g/dL — ABNORMAL LOW (ref 13.0–17.0)
Hemoglobin: 9.7 g/dL — ABNORMAL LOW (ref 13.0–17.0)
MCH: 30.2 pg (ref 26.0–34.0)
MCH: 29.4 pg (ref 26.0–34.0)
MCHC: 32.4 g/dL (ref 30.0–36.0)
MCHC: 32.2 g/dL (ref 30.0–36.0)
Platelets: 525 10*3/uL — ABNORMAL HIGH (ref 150–400)
Platelets: 575 10*3/uL — ABNORMAL HIGH (ref 150–400)
RBC: 3.23 MIL/uL — ABNORMAL LOW (ref 4.22–5.81)
RDW: 18.6 % — ABNORMAL HIGH (ref 11.5–15.5)
RDW: 18.6 % — ABNORMAL HIGH (ref 11.5–15.5)
WBC: 6 10*3/uL (ref 4.0–10.5)
WBC: 5.4 10*3/uL (ref 4.0–10.5)

## 2010-12-28 LAB — BASIC METABOLIC PANEL
BUN: 10 mg/dL (ref 6–23)
CO2: 31 mEq/L (ref 19–32)
Calcium: 9.8 mg/dL (ref 8.4–10.5)
GFR calc non Af Amer: >90 mL/min (ref >90)
Glucose, Bld: 99 mg/dL (ref 70–99)

## 2010-12-28 LAB — GLUCOSE, CAPILLARY
Glucose-Capillary: 160 mg/dL — ABNORMAL HIGH (ref 70–99)
Glucose-Capillary: 116 mg/dL — ABNORMAL HIGH (ref 70–99)
Glucose-Capillary: 125 mg/dL — ABNORMAL HIGH (ref 70–99)

## 2010-12-28 LAB — HEMOGLOBIN A1C
Hgb A1c MFr Bld: 5.8 % — ABNORMAL HIGH (ref <5.7)
Mean Plasma Glucose: 120 mg/dL — ABNORMAL HIGH (ref <117)

## 2010-12-28 LAB — URINALYSIS, ROUTINE W REFLEX MICROSCOPIC
Bilirubin Urine: NEGATIVE
Glucose, UA: NEGATIVE mg/dL
Ketones, ur: NEGATIVE mg/dL
Protein, ur: NEGATIVE mg/dL
pH: 5.5 (ref 5.0–8.0)

## 2010-12-28 LAB — COMPREHENSIVE METABOLIC PANEL
BUN: 11 mg/dL (ref 6–23)
CO2: 32 mEq/L (ref 19–32)
Chloride: 100 mEq/L (ref 96–112)
Creatinine, Ser: 0.59 mg/dL (ref 0.50–1.35)
GFR calc Af Amer: >90 mL/min (ref >90)
GFR calc non Af Amer: >90 mL/min (ref >90)
Glucose, Bld: 100 mg/dL — ABNORMAL HIGH (ref 70–99)
Total Bilirubin: 0.2 mg/dL — ABNORMAL LOW (ref 0.3–1.2)

## 2010-12-28 LAB — CARDIAC PANEL(CRET KIN+CKTOT+MB+TROPI)
CK, MB: 1.3 ng/mL (ref 0.3–4.0)
Relative Index: INVALID (ref 0.0–2.5)
Relative Index: INVALID (ref 0.0–2.5)
Total CK: 28 U/L (ref 7–232)
Troponin I: <0.3 ng/mL (ref <0.30)
Troponin I: <0.3 ng/mL (ref <0.30)

## 2010-12-28 LAB — FOLATE: Folate: 4.5 ng/mL

## 2010-12-28 LAB — MAGNESIUM: Magnesium: 1.7 mg/dL (ref 1.5–2.5)

## 2010-12-28 LAB — IRON AND TIBC
Saturation Ratios: 9 % — ABNORMAL LOW (ref 20–55)
UIBC: 188 ug/dL (ref 125–400)

## 2010-12-28 LAB — RETICULOCYTES: Retic Ct Pct: 1.2 % (ref 0.4–3.1)

## 2010-12-28 LAB — FERRITIN: Ferritin: 565 ng/mL — ABNORMAL HIGH (ref 22–322)

## 2010-12-28 LAB — LACTATE DEHYDROGENASE: LDH: 165 U/L (ref 94–250)

## 2010-12-29 LAB — DIFFERENTIAL
Basophils Absolute: 0 10*3/uL (ref 0.0–0.1)
Basophils Relative: 0 % (ref 0–1)
Eosinophils Absolute: 0.3 10*3/uL (ref 0.0–0.7)
Eosinophils Absolute: 0.3 10*3/uL (ref 0.0–0.7)
Eosinophils Absolute: 0.3 10*3/uL (ref 0.0–0.7)
Eosinophils Relative: 4 % (ref 0–5)
Eosinophils Relative: 5 % (ref 0–5)
Lymphocytes Relative: 32 % (ref 12–46)
Lymphs Abs: 1.9 10*3/uL (ref 0.7–4.0)
Lymphs Abs: 2 10*3/uL (ref 0.7–4.0)
Lymphs Abs: 1.7 10*3/uL (ref 0.7–4.0)
Monocytes Absolute: 0.4 10*3/uL (ref 0.1–1.0)
Monocytes Absolute: 0.4 10*3/uL (ref 0.1–1.0)
Monocytes Relative: 7 % (ref 3–12)
Monocytes Relative: 7 % (ref 3–12)
Neutrophils Relative %: 55 % (ref 43–77)
Neutrophils Relative %: 62 % (ref 43–77)

## 2010-12-29 LAB — CBC
HCT: 31.6 % — ABNORMAL LOW (ref 39.0–52.0)
MCH: 29.1 pg (ref 26.0–34.0)
MCH: 29.6 pg (ref 26.0–34.0)
MCHC: 31.7 g/dL (ref 30.0–36.0)
MCHC: 32.3 g/dL (ref 30.0–36.0)
MCV: 91.7 fL (ref 78.0–100.0)
MCV: 91.1 fL (ref 78.0–100.0)
MCV: 91.6 fL (ref 78.0–100.0)
Platelets: 554 10*3/uL — ABNORMAL HIGH (ref 150–400)
Platelets: 563 10*3/uL — ABNORMAL HIGH (ref 150–400)
Platelets: 573 10*3/uL — ABNORMAL HIGH (ref 150–400)
RBC: 3.27 MIL/uL — ABNORMAL LOW (ref 4.22–5.81)
RBC: 3.37 MIL/uL — ABNORMAL LOW (ref 4.22–5.81)
RDW: 18.5 % — ABNORMAL HIGH (ref 11.5–15.5)
RDW: 18.7 % — ABNORMAL HIGH (ref 11.5–15.5)
WBC: 7.1 10*3/uL (ref 4.0–10.5)
WBC: 5.3 10*3/uL (ref 4.0–10.5)

## 2010-12-29 LAB — PHOSPHORUS: Phosphorus: 4.3 mg/dL (ref 2.3–4.6)

## 2010-12-29 LAB — GLUCOSE, CAPILLARY
Glucose-Capillary: 111 mg/dL — ABNORMAL HIGH (ref 70–99)
Glucose-Capillary: 101 mg/dL — ABNORMAL HIGH (ref 70–99)
Glucose-Capillary: 97 mg/dL (ref 70–99)
Glucose-Capillary: 87 mg/dL (ref 70–99)

## 2010-12-29 LAB — BASIC METABOLIC PANEL
BUN: 13 mg/dL (ref 6–23)
Chloride: 101 mEq/L (ref 96–112)
Creatinine, Ser: 0.71 mg/dL (ref 0.50–1.35)
GFR calc Af Amer: >90 mL/min (ref >90)
GFR calc non Af Amer: >90 mL/min (ref >90)
Glucose, Bld: 121 mg/dL — ABNORMAL HIGH (ref 70–99)
Potassium: 4.2 mEq/L (ref 3.5–5.1)

## 2010-12-29 LAB — LIPID PANEL
HDL: 34 mg/dL — ABNORMAL LOW (ref >39)
LDL Cholesterol: 138 mg/dL — ABNORMAL HIGH (ref 0–99)
Total CHOL/HDL Ratio: 6.2 RATIO

## 2010-12-29 LAB — PROTIME-INR: Prothrombin Time: 14.9 seconds (ref 11.6–15.2)

## 2010-12-29 LAB — MAGNESIUM: Magnesium: 2.1 mg/dL (ref 1.5–2.5)

## 2010-12-30 ENCOUNTER — Inpatient Hospital Stay (HOSPITAL_COMMUNITY): Payer: Medicaid Other

## 2010-12-30 LAB — CBC
HCT: 31.2 % — ABNORMAL LOW (ref 39.0–52.0)
Hemoglobin: 10.5 g/dL — ABNORMAL LOW (ref 13.0–17.0)
MCH: 29.6 pg (ref 26.0–34.0)
MCH: 29.4 pg (ref 26.0–34.0)
MCHC: 32.4 g/dL (ref 30.0–36.0)
MCHC: 32.3 g/dL (ref 30.0–36.0)
MCV: 91.6 fL (ref 78.0–100.0)
Platelets: 554 10*3/uL — ABNORMAL HIGH (ref 150–400)
Platelets: 582 10*3/uL — ABNORMAL HIGH (ref 150–400)
Platelets: 581 10*3/uL — ABNORMAL HIGH (ref 150–400)
Platelets: 532 10*3/uL — ABNORMAL HIGH (ref 150–400)
RBC: 3.5 MIL/uL — ABNORMAL LOW (ref 4.22–5.81)
RBC: 3.45 MIL/uL — ABNORMAL LOW (ref 4.22–5.81)
RDW: 18.8 % — ABNORMAL HIGH (ref 11.5–15.5)
RDW: 18.7 % — ABNORMAL HIGH (ref 11.5–15.5)
WBC: 5.2 10*3/uL (ref 4.0–10.5)
WBC: 6.8 10*3/uL (ref 4.0–10.5)
WBC: 6 10*3/uL (ref 4.0–10.5)

## 2010-12-30 LAB — DIFFERENTIAL
Basophils Absolute: 0 10*3/uL (ref 0.0–0.1)
Basophils Absolute: 0 10*3/uL (ref 0.0–0.1)
Basophils Absolute: 0 10*3/uL (ref 0.0–0.1)
Basophils Relative: 0 % (ref 0–1)
Basophils Relative: 0 % (ref 0–1)
Eosinophils Absolute: 0.3 10*3/uL (ref 0.0–0.7)
Eosinophils Absolute: 0.2 10*3/uL (ref 0.0–0.7)
Eosinophils Absolute: 0.3 10*3/uL (ref 0.0–0.7)
Eosinophils Absolute: 0.4 10*3/uL (ref 0.0–0.7)
Eosinophils Relative: 6 % — ABNORMAL HIGH (ref 0–5)
Lymphocytes Relative: 33 % (ref 12–46)
Lymphocytes Relative: 37 % (ref 12–46)
Lymphs Abs: 2.2 10*3/uL (ref 0.7–4.0)
Monocytes Absolute: 0.5 10*3/uL (ref 0.1–1.0)
Monocytes Absolute: 0.5 10*3/uL (ref 0.1–1.0)
Monocytes Relative: 7 % (ref 3–12)
Neutro Abs: 4.1 10*3/uL (ref 1.7–7.7)
Neutro Abs: 3 10*3/uL (ref 1.7–7.7)
Neutrophils Relative %: 61 % (ref 43–77)
Neutrophils Relative %: 50 % (ref 43–77)

## 2010-12-30 LAB — BASIC METABOLIC PANEL
BUN: 18 mg/dL (ref 6–23)
Chloride: 100 mEq/L (ref 96–112)
Creatinine, Ser: 0.77 mg/dL (ref 0.50–1.35)
GFR calc Af Amer: >90 mL/min (ref >90)
GFR calc non Af Amer: >90 mL/min (ref >90)
Potassium: 4.5 mEq/L (ref 3.5–5.1)

## 2010-12-30 LAB — MAGNESIUM: Magnesium: 2 mg/dL (ref 1.5–2.5)

## 2010-12-31 LAB — DIFFERENTIAL
Eosinophils Relative: 7 % — ABNORMAL HIGH (ref 0–5)
Lymphocytes Relative: 37 % (ref 12–46)
Lymphs Abs: 2.1 10*3/uL (ref 0.7–4.0)

## 2010-12-31 LAB — BASIC METABOLIC PANEL
BUN: 23 mg/dL (ref 6–23)
CO2: 27 mEq/L (ref 19–32)
Chloride: 97 mEq/L (ref 96–112)
Creatinine, Ser: 0.83 mg/dL (ref 0.50–1.35)

## 2010-12-31 LAB — CBC
HCT: 32.3 % — ABNORMAL LOW (ref 39.0–52.0)
MCV: 92 fL (ref 78.0–100.0)
RBC: 3.51 MIL/uL — ABNORMAL LOW (ref 4.22–5.81)
RDW: 18.7 % — ABNORMAL HIGH (ref 11.5–15.5)
WBC: 5.8 10*3/uL (ref 4.0–10.5)

## 2011-01-01 LAB — GLUCOSE, CAPILLARY: Glucose-Capillary: 122 mg/dL — ABNORMAL HIGH (ref 70–99)

## 2011-01-04 ENCOUNTER — Telehealth (HOSPITAL_COMMUNITY): Payer: Self-pay | Admitting: *Deleted

## 2011-01-04 NOTE — Telephone Encounter (Signed)
Pt called today, was seen in the ed a few days ago, was told he would have an appt for Friday, there is not one scheduled for him in this clinic.

## 2011-01-04 NOTE — Telephone Encounter (Signed)
Will bring in at 8:15 10/12

## 2011-01-05 ENCOUNTER — Encounter (HOSPITAL_COMMUNITY): Payer: Self-pay

## 2011-01-05 ENCOUNTER — Ambulatory Visit (HOSPITAL_COMMUNITY)
Admission: RE | Admit: 2011-01-05 | Discharge: 2011-01-05 | Disposition: A | Discharge status: Home / Self Care | Payer: Medicaid Other | Source: Ambulatory Visit | Attending: Internal Medicine | Admitting: Internal Medicine

## 2011-01-05 VITALS — BP 120/64 | HR 97 | Wt 157.8 lb

## 2011-01-05 DIAGNOSIS — E785 Hyperlipidemia, unspecified: Secondary | ICD-10-CM | POA: Insufficient documentation

## 2011-01-05 DIAGNOSIS — D649 Anemia, unspecified: Secondary | ICD-10-CM | POA: Insufficient documentation

## 2011-01-05 DIAGNOSIS — Z87891 Personal history of nicotine dependence: Secondary | ICD-10-CM | POA: Insufficient documentation

## 2011-01-05 DIAGNOSIS — F1021 Alcohol dependence, in remission: Secondary | ICD-10-CM | POA: Insufficient documentation

## 2011-01-05 DIAGNOSIS — E119 Type 2 diabetes mellitus without complications: Secondary | ICD-10-CM | POA: Insufficient documentation

## 2011-01-05 DIAGNOSIS — Z720 Tobacco use: Secondary | ICD-10-CM | POA: Insufficient documentation

## 2011-01-05 DIAGNOSIS — I5022 Chronic systolic (congestive) heart failure: Secondary | ICD-10-CM

## 2011-01-05 DIAGNOSIS — I428 Other cardiomyopathies: Secondary | ICD-10-CM | POA: Insufficient documentation

## 2011-01-05 DIAGNOSIS — F17200 Nicotine dependence, unspecified, uncomplicated: Secondary | ICD-10-CM

## 2011-01-05 DIAGNOSIS — Z79899 Other long term (current) drug therapy: Secondary | ICD-10-CM | POA: Insufficient documentation

## 2011-01-05 HISTORY — DX: Other cardiomyopathies: I42.8

## 2011-01-05 HISTORY — DX: Tobacco use: Z72.0

## 2011-01-05 HISTORY — DX: Alcohol abuse, uncomplicated: F10.10

## 2011-01-05 HISTORY — DX: Hyperlipidemia, unspecified: E78.5

## 2011-01-05 HISTORY — DX: Gastrointestinal hemorrhage, unspecified: K92.2

## 2011-01-05 HISTORY — DX: Type 2 diabetes mellitus without complications: E11.9

## 2011-01-05 HISTORY — DX: Anemia, unspecified: D64.9

## 2011-01-05 HISTORY — DX: Unspecified osteoarthritis, unspecified site: M19.90

## 2011-01-05 LAB — BASIC METABOLIC PANEL
BUN: 28 mg/dL — ABNORMAL HIGH (ref 6–23)
Creatinine, Ser: 1.1 mg/dL (ref 0.50–1.35)
GFR calc Af Amer: 86 mL/min — ABNORMAL LOW (ref >90)
GFR calc non Af Amer: 74 mL/min — ABNORMAL LOW (ref >90)

## 2011-01-05 LAB — PRO B NATRIURETIC PEPTIDE: Pro B Natriuretic peptide (BNP): 156.7 pg/mL — ABNORMAL HIGH (ref 0–125)

## 2011-01-05 NOTE — Assessment & Plan Note (Addendum)
Doing well NYHA II. Volume status looks good. (weight up 1-2 pounds from d/c) Will continue current medical regimen for now. We provided him a scale and reviewed use of sliding scale lasix. Will have SW see to help with obtaining meds. Reinforced need to f/u with [facility] clinic for other medical issues and help with meds. Congratulated him on ETOH cessation. Will see back in 2 weeks to begin med titration. Check labs today.

## 2011-01-05 NOTE — Assessment & Plan Note (Signed)
Encouraged smoking cessation 

## 2011-01-05 NOTE — Patient Instructions (Signed)
Labs today  Your physician recommends that you weigh, daily, at the same time every day, and in the same amount of clothing. Please record your daily weights on the handout provided and bring it to your next appointment.  If your weight is up 3 lbs in 24 hours or 5 lbs in a week please give [place] a call.  Your physician recommends that you schedule a follow-up appointment in: 2 weeks

## 2011-01-05 NOTE — Progress Notes (Signed)
HPI:  [name] is a 55 y/o male with a h/o ETOH and tobacco abuse as well as severe osteoarthritis. He was admitted to [facility] from 10/3-10/6 with acute systolic HF.   Echo showed EF 20-25% with global HK. Cath showed minimal non-obstructive CAD. He was diuresed and started on appropriate medical therapy. Weight on admission was 77kg and he was discharged at [facility]. Returns for hospital f/u.   Doing well. Ambulating without a problem. Denies SOB, edema, orthopnea or PND. Says he could only get 15 days of his medications due to financial constraints. Not weighing as he doesn't have a scale. + arthritis pain. Occasional lightheadedness. No palpitations.   Stopped drinking ETOH completely. Still smoking 3-4 cigs/day.     ROS: All systems negative except as listed in HPI, PMH and Problem List.  Past Medical History  Diagnosis Date  . Systolic heart failure     EF 20-25% by echo 12/28/10  . Nonischemic cardiomyopathy     minimal coronary disease by cath 12/29/10  . Hemoptysis     secondary to pulmonary edema  . Hyperlipidemia   . Chronic anemia   . GI bleed     15 years ago  . Tobacco abuse   . Alcohol abuse   . Osteoarthritis   . Diabetes mellitus, type 2     Current Outpatient Prescriptions  Medication Sig Dispense Refill  . carvedilol (COREG) 6.25 MG tablet Take 6.25 mg by mouth 2 (two) times daily with a meal.        . digoxin (LANOXIN) 0.25 MG tablet Take 250 mcg by mouth daily.        . furosemide (LASIX) 40 MG tablet Take 40 mg by mouth 2 (two) times daily.        [name] lisinopril (PRINIVIL,ZESTRIL) 10 MG tablet Take 10 mg by mouth 2 (two) times daily.        [name] oxyCODONE-acetaminophen (PERCOCET) 5-325 MG per tablet Take 1 tablet by mouth every 4 (four) hours as needed.        . potassium chloride SA (K-DUR,KLOR-CON) 20 MEQ tablet Take 20 mEq by mouth daily.        . Prenat-FeCbn-FeBisg-FA-Omega (MULTIVITAMIN/MINERALS PO) Take 1 capsule by mouth daily.        [name] spironolactone  (ALDACTONE) 25 MG tablet Take 25 mg by mouth daily.           PHYSICAL EXAM: Filed Vitals:   01/05/11 0832  BP: 120/64  Pulse: 97   General:  Well appearing. No resp difficulty HEENT: normal except for poor dentition. Neck: supple. JVP flat. Carotids 2+ bilaterally; no bruits. No lymphadenopathy or thryomegaly appreciated. Cor: PMI normal. Regular rate & rhythm. No rubs, gallops or murmurs. Lungs: clear Abdomen: soft, nontender, nondistended. No hepatosplenomegaly. No bruits or masses. Good bowel sounds. Extremities: no cyanosis, clubbing, rash, edema Neuro: alert & orientedx3, cranial nerves grossly intact. Moves all 4 extremities w/o difficulty. Affect pleasant.     ASSESSMENT & PLAN:

## 2011-01-08 NOTE — Discharge Summary (Signed)
NAMEMELBURN, LEIBFRIED NO.:  [id number]  MEDICAL RECORD NO.:  [id number]  LOCATION:  [id number]                         FACILITY:  [facility]  PHYSICIAN:  Verlee Monte, MD       DATE OF BIRTH:  [birth year]  DATE OF ADMISSION:  12/27/2010 DATE OF DISCHARGE:  12/30/2010                              DISCHARGE SUMMARY   PRIMARY CARE PHYSICIAN:  [facility].  PRIMARY CARDIOLOGIST:  Shaune Pascal. Bensimhon, MD  REASON FOR ADMISSION:  Hemoptysis and shortness of breath.  DISCHARGE DIAGNOSES: 1. Acute systolic congestive heart failure. 2. Pulmonary edema. 3. Nonischemic cardiomyopathy. 4. Hemoptysis secondary to pulmonary edema. 5. Hyperlipidemia. 6. Chronic anemia. 7. History of gastrointestinal bleed about 15 years ago. 8. Tobacco abuse. 9. History of alcohol abuse. 10.Osteoarthritis and right knee pain.  DISCHARGE MEDICATIONS: 1. Coreg 6.25 mg p.o. b.i.d. 2. Digoxin 0.25 mg p.o. daily. 3. Furosemide 40 mg p.o. b.i.d. 4. K-Dur 0 mEq p.o. daily. 5. Lisinopril 10 mg p.o. b.i.d. 6. Multivitamin with minerals p.o. daily. 7. Spironolactone 25 mg p.o. daily. 8. Percocet 5/325 mg p.o. q.6 h. p.r.n. for pain.  BRIEF HISTORY AND EXAMINATION:  Mr. Beyler is a 55 year old African American male with past medical history of alcohol abuse, abnormal stress test in July 2010.  The patient also was labeled before to have type 2 diabetes mellitus.  The patient came into the hospital complaining about hemoptysis.  The patient reports that over couple of days prior to admission having worsening dyspnea as well as feeling he had some heaviness in his lungs, progressively getting worse, and then he noticed increased swelling in his legs and knees for the past 5-6 months.  The patient came in with hemoptysis.  He mentioned small streaks of blood and frothy whitish sputum.  RADIOLOGY: 1. Chest x-ray December 30, 2010, showed resolved pulmonary interstitial     edema, no acute  cardiopulmonary. 2. Chest x-ray December 27, 2010, showed acute pulmonary edema. 3. Knee x-ray December 27, 2010, showed tricompartmental degenerative     changes and moderate to large joint effusion, no acute fractures. 4. Cardiac catheterization December 29, 2010, done by Dr. Loralie Champagne     showed minimal coronary artery disease, cardiomyopathy appeared to     be nonischemic.  He continues to have elevated filling pressure     with LVN and diastolic pressure of 31 mmHg. 5. Echocardiogram showed an ejection fraction of 20% to 25%, diffuse     hypokinesis.  There is mild LVH as well.  BRIEF HOSPITAL COURSE: 1. Acute systolic congestive heart failure.  After the patient was     admitted to the hospital, the patient started aggressive diuresis     with Lasix.  Cardiology was consulted to determine the cause of     cardiomyopathy.  Cardiac catheterization was done, which showed     basically patent coronaries and likely the cause of his nonischemic     cardiomyopathy.  The patient was seen by Dr. Haroldine Laws who     recommended all of his discharge medications as above, all of his     heart failure discharge medications as above. 2. Nonischemic cardiomyopathy.  As  mentioned above, cardiac     catheterization was done showed no coronary artery disease. 3. Pulmonary edema. 4. Hemoptysis.  Hemoptysis was secondary to acute pulmonary edema from     the acute congestive heart failure.  Hemoptysis resolved at the     time the pulmonary edema resolved. 5. Anemia.  Seems like a chronic anemia.  The patient was put on     multivitamins.  The patient to follow up with his primary care     physician. 6. Osteoarthritis.  The patient has right knee pain and the right knee     x-ray showed tricompartmental osteoarthritis.  The patient was     discharged on Percocet as needed.  The patient to follow up with     his primary care physician, probably needs orthopedic followup.     Verlee Monte,  MD     ME/MEDQ  D:  12/31/2010  T:  12/31/2010  Job:  [id number]  cc:   Shaune Pascal. Bensimhon, [place], MD [facility]  Electronically Signed by Verlee Monte  on 01/08/2011 01:58:23 PM

## 2011-01-11 NOTE — H&P (Signed)
Travis [name], BOSKET NO.:  [id number]  MEDICAL RECORD NO.:  [id number]  LOCATION:  [facility]                         FACILITY:  [facility]  PHYSICIAN:  Ria Bush, MD         DATE OF BIRTH:  1956-03-02  DATE OF ADMISSION:  12/27/2010 DATE OF DISCHARGE:                             HISTORY & PHYSICAL   PRIMARY CARE PHYSICIAN:  Does not have one.  CHIEF COMPLAINT:  Hemoptysis, shortness of breath and wheeziness.  HISTORY OF PRESENT ILLNESS:  This is a 55 year old male with a history of abnormal stress test in July 2010 suggesting a prior inferior infarct, type 2 diabetes, tobacco and alcohol abuse who presented to the ED with hemoptysis and found to be in pulmonary edema with bilateral lower extremity edema.  The patient reports that over the past couple of days, he has been having worsening dyspnea, such that he feels he cannot catch his breath and "queasiness" in his lungs, it has been progressively getting worse over the last couple of days.  He has also noticed increased swelling in his legs and knees.  He has had worsening right lower extremity swelling for the past 5 or 6 months, but it has been getting worse over the past several weeks bilaterally.  He also notes that he has had 9 episodes of small volume hemoptysis of "streaks" of blood, but no large-volume hemoptysis.  He has had no chest pain or angina during all of this symptomatology though.  He came to the emergency room for evaluation of this and found to be hypertensive 151/93, pulse 109, respirations 18 and temperature 99.4. His workup in the emergency room was significant for chest x-ray showing acute pulmonary edema.  A right knee plain film showing persistent osteoarthritic changes and moderate to large joint effusion (these were also noted during an ED evaluation on September 27).  He was also significantly found to have a hematocrit drop from 34 about a week ago to 28 currently.  We do not have  coags though, otherwise the rest of his lab work is fairly unremarkable including a troponin x1 and EKG.  His BMP is elevated at 2736.  His Hemoccult was negative in the emergency room.  He is being admitted to triad for further evaluation of hemoptysis and apparent decompensated CHF.  In discussion with the patient, he endorses the above and also reports that his right knee has been getting more painful due to osteoarthritis over numerous months.  He has had an arthrocentesis at [facility] a while back and they removed 4 to 5 tubes of fluid and he was also evaluated about a week ago and told to follow up with Orthopedics and discharge with Percocet and Naprosyn.  He currently states that the knee is not more swollen than it has been during the last evaluation when his hematocrit was at baseline.  It has not been red or swollen, but it is painful.  He otherwise denies any angina or chest pain presently or in his usual state of health.  He does endorse "hot sweats," but has not measured his temperature and states that he will sometimes soak his shirt  with sweat, but he denies any lymphadenopathy, sore throat or any rashes.  He does endorse a weight loss of 10 pounds over the past month. He has had some left elbow swelling over his olecranon for the past 7 or 8 weeks, but he has full range of motion and it is not painful.  He denies any GI blood loss.  No melena.  No bright red blood per rectum. He has had no other issues with his bladder or bowels and reports no other obvious source of blood loss.  He is having no other GI issues and no abdominal pain.  Right knee osteoarthritis with effusion.  This has been a chronic problem for which he was supposed to see an orthopedist as an outpatient.  I do not think this is among his most pressing problems, but will treat pain with Tylenol and avoid NSAIDs given his heart failure and would recommend and set up an Ortho appointment as  an outpatient.  PAST MEDICAL HISTORY:  Reviewed in [place]. 1. Suspected coronary artery disease with an abnormal stress test in     July 2010, suggesting a prior inferior infarct that was not     reversible with EF of 40%.  An echo in July [id number] showed diastolic     dysfunction, mild to moderate LVH and EF of 65% and normal LV wall     function. 2. Type 2 diabetes. 3. Hyperlipidemia. 4. Macrocytosis thought due to alcohol abuse. 5. History of GI bleed, status post EGD 15 years ago. 6. Tobacco abuse. 7. Alcohol abuse with intermittent withdrawal symptoms, but no reports     of alcoholic hallucinosis or delirium tremens.  MEDICATIONS:  He denies taking any daily medications, but only Tylenol for pain as needed.  ALLERGIES:  There are no known drug allergies.  SOCIAL HISTORY:  He lives with his brother at home and is a previous Horticulturist, commercial.  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  He does smoke over a pack per day for the past 15 years.  He has also a "weekend drinker" and would drink up to a case of beer over the weekend and reports that he has previously had the shakes as part of alcohol withdrawal, but has not had any DTs or alcohol hallucinosis or ICU admissions for withdrawal.  He does endorse several of the CAGE questions including being concerned about his drinking and having others comment on his drinking and that he will very rarely drink in the morning, but does not feel guilty about drinking. He does not do any other drugs.  FAMILY HISTORY:  Significant for diabetes and heart problems.  PHYSICAL EXAMINATION:  VITAL SIGNS:  Blood pressure 150/87, pulse 103, respirations 15, temperature 98.4, 95% on room air. GENERAL:  He is a thin, not ill-appearing man, in no distress.  He is pleasant, able to relate his history well.  He is in no cardiopulmonary distress and able to speak full sentences without getting short  of breath. HEENT:  His pupils are equal, round, and reactive to light.  His extraocular muscles are intact.  His sclerae are clear.  His mouth is fairly normal appearing with no suspicious lesions. LUNGS:  Actually quite clear to auscultation bilaterally with no wheezes, crackles, rales or rhonchi appreciated. HEART:  Regular rate and rhythm, but there is a very clear S3 between the left lower sternal border and the apex.  There are no appreciable  murmurs though. ABDOMEN:  Bit protuberant, but it is soft, nontender, nondistended and completely benign. EXTREMITIES:  Warm, well perfused with no cyanosis or clubbing.  His left posterior elbow has a pocket of swelling and fluid, but it is nontender, non erythematosus and not warm.  He has full range of motion of his left elbow as well too.  His right elbow is completely normal. His bilateral lower extremities exhibit soft pitting edema up to just below the shin with chronic hyperpigmentation venous stasis changes as well.  His right knee has gross effusion compared to the left and it is most prominent on the superior aspect of the patella, it is not particularly tender, warm or red though. NEUROLOGIC:  He is intact with no gross focal neurological deficits that are noted.  He is alert and conversant and moving his extremities and eating dinner in bed.  LABORATORY DATA:  White blood cell count is 6.6, his hematocrit is 28.0 and it was previously 34.0 about a week ago, platelets 490.  His chemistry panel is normal including renal function of 11 and 0.57, calcium 9.6.  Troponin negative at 0.01.  BNP is elevated at 2736. Fecal occult blood is negative.  DIAGNOSTIC DATA:  Chest x-ray shows acute pulmonary edema.  Right knee plain film shows tricompartmental degenerative changes and moderate to large joint effusion, but no acute fracture.  This is the same as it was about a week ago.  EKG is normal sinus rhythm with a normal axis, some  possible LAE in V1, normal QRS duration, but with inverted T-waves in his inferolateral leads.  This is unchanged compared to prior from 2 years ago though.  IMPRESSION:  This is a 54 year old male with history of suspected coronary artery disease with possible inferior infarct and a prior stress test in July 2010; however, not apparent on echo, suspected type 2 diabetes who presents with dyspnea and wheezing with small volume hemoptysis and bilateral lower extremity edema and found to be in decompensated congestive heart failure. 1. Congestive heart failure, possible etiologies of this includes     recently being discharged with course of Naprosyn.  He does not     appear ischemic at this time.  He has had little contact with the     medical community, therefore medication and dietary noncompliance     are also highly likely.  Overnight, we will treat him with Lasix 40 mg IV q.6 h. for 24 hours, then continue to titrate his diuresis accordingly.  Would hold his Lasix if his creatinine bumps by greater than 0.3.  I will hold off on starting an ACE inhibitor at this time given we are concurrently diuresing him with Lasix, but we will start carvedilol for hypertension though.  We will repeat an echo.  We will get a TSH x1.  Although, I do not think that he is actively infected.  We will get a UA to complete the infectious workup.  1. Acute anemia.  His hematocrit has dropped within the last week.     Although, he is having hemoptysis this does not sound significant     enough to cause a 6 point hematocrit drop in a week.  Therefore, I     suspect either a spurious lab value which we will evaluate with a     repeat CBC right now (and get coags as well).  Hemoccult is     negative, despite his history of GI bleed making this less likely.  There are no other apparent sources of blood loss per review of     systems.  We will follow up the repeat CBC and if it is persistently low, I  will proceed with chest CT to make sure he is not having pulmonary hemorrhage that is being misinterpreted as pulmonary edema on chest x-ray.  He also has an anemia panel pending to rule out hemolysis.  We should keep an active type and screen and would also place a second peripheral IV when we are able.  We will hold off on transfusion just yet though.  1. Coronary artery disease.  This is an unclear history to me as he     had a prior stress test showing a fixed inferior defect, but this     was not really apparent by echo and there was quite a difference in     the EF as well.  I would consider Cardiology consult while     inpatient versus close outpatient followup.  For now, we will start     aspirin 81 and carvedilol as above.  He does not appear to be     acutely ischemic at present though.  1. Diabetes, this was diagnosed during previous admission and he is     previously on an oral agent, but has not been taking it.  His     glucose on his chemistry is actually fairly decent, so at this     point we will monitor his CBG q.4 and start him on low-dose sliding     scale insulin p.r.n.  However, he may be able to be discharged on     an oral agent.  1. Fluid, electrolytes and nutrition, diabetic, heart healthy diet.     No IV fluids.  1. Prophylaxis.  Subcutaneous heparin, Zofran, aspirin 81 and Tylenol.  1. IV access.  He has one small bore peripheral his left arm.  1. Code status.  He is a full code as I discussed with him.  The patient will be admitted to telemetry to [facility] Team 5.          ______________________________ Ria Bush, MD     EB/MEDQ  D:  12/27/2010  T:  12/27/2010  Job:  [id number]  Electronically Signed by Ria Bush MD on 01/11/2011 07:46:16 PM

## 2011-01-14 NOTE — Consult Note (Signed)
Travis [name], Travis [name] NO.:  [id number]  MEDICAL RECORD NO.:  [id number]  LOCATION:  [id number]                         FACILITY:  [facility]  PHYSICIAN:  Marijo Conception. Wayland Baik, MD, FACCDATE OF BIRTH:  1955-04-18  DATE OF CONSULTATION:  12/28/2010 DATE OF DISCHARGE:                                CONSULTATION   PRIMARY CARDIOLOGIST:  New to [facility], being seen by Dr. Verl Blalock.  PRIMARY CARE PROVIDER:  He does not have a primary care provider.  PATIENT PROFILE:  This is a 55 year old male with prior history of diastolic CHF who presents with acute systolic CHF.  PROBLEM LIST: 1. Acute systolic congestive heart failure.     a.     Echocardiogram performed today suggesting ejection fraction      of 20-25%, full report to follow. 2. Presumed coronary artery disease based on Myoview in 2010 showing a     fixed defect along the inferior Terrill Alperin without ischemia. 3. Hyperlipidemia. 4. Type 2 diabetes mellitus. 5. History of gastrointestinal bleed. 6. Ongoing tobacco abuse. 7. Ethyl alcohol abuse drinking approximately 3 bottles of wine on the     weekends. 8. Medical noncompliance. 9. Marijuana abuse.  ALLERGIES:  No known drug allergies.  HISTORY OF PRESENT ILLNESS:  This is a 55 year old male without prior history of diastolic heart failure, normal LV function in 2010.  At that time, he also had a nonischemic Myoview, although there was suggestion of inferior infarct.  The patient tries to stay active at home and walks regularly and has not been having any significant limitations, however, about a month ago he started to experience lower extremity swelling, right greater than left.  This began to limit his activity.  Two days ago, the patient noted dyspnea on exertion, which progressed throughout the day and became associated with cough with low-volume hemoptysis and streaking of secretions.  This worsened acutely yesterday morning, and presented to the [facility] ED where  he was found to be in pulmonary edema by chest x-ray.  The patient has been admitted and had one set of negative cardiac enzymes without additional sets being drawn.  EKG shows sinus tachycardia and he has inferolateral ST depression and T-wave inversion, however, this has not changed since 2010.  The patient has diuresed well with reduction of weight by approximately 2.2 kg and has had some symptomatic improvement, though continues to have lower extremity edema as well as elevated neck veins and crackles on exam.  We have been asked to evaluate.  He denies any history of chest pain.  During our interview, the patient was undergoing echocardiogram which preliminarily shows an EF of 20-25%.  The patient denies orthopnea, early satiety, presyncope, or syncope.  He did have an episode of PND yesterday morning.  HOME MEDICATIONS:  None.  CURRENT MEDICATIONS: 1. Aspirin 81 mg daily. 2. Carvedilol 6.25 mg b.i.d. 3. Lasix 40 mg IV q.6 h. 4. Heparin 5000 units q.8 h. 5. K-Dur 40 mEq x1.  FAMILY HISTORY:  Mother died in her 75s from MI, had history of diabetes and hypertension.  Father died in his late 75s of an MI, had hypertension and diabetes.  He has  a brother with dementia and a sister with diabetes and another sister is healthy.  SOCIAL HISTORY:  The patient lives in [place] with his brother.  He was previously a Horticulturist, commercial.  He has a 15-pack-year history of tobacco abuse, continues to smoke.  He drinks 3 bottles of wine one the weekends.  Smokes marijuana, but says he has not smoked any in 8 months.  REVIEW OF SYSTEMS:  Positive for headache.  He has had low-volume hemoptysis with blood-streaked sputum.  He did have an episode of PND yesterday and has had lower extremity edema for at least 1 month if not more.  Occasionally notes expiratory wheezing.  He has had dyspnea on exertion.  He denies chest pain.  He is a full code.  Otherwise, all systems reviewed are  negative.  PHYSICAL EXAMINATION:  VITAL SIGNS:  Temperature 97.9, heart rate 100, respirations 20, blood pressure 144/85, and pulse ox 98% on room air. Weight is 76.6 kg, down from 76.8 kg. GENERAL:  A pleasant African American male in no acute distress, awake, alert, and oriented x3. HEENT:  Normal. NEURO:  Grossly intact and nonfocal. SKIN:  Warm and dry without lesions.  He does have chronic venous stasis changes noted on both anterior and lower extremities. NECK:  Supple without bruits.  He has JVP of approximately 12-13 cm. LUNGS:  Respirations are regular and unlabored with bilateral crackles in the third the way up. CARDIAC:  Regular, S1 and S2.  Positive S3.  No murmurs. ABDOMEN:  Round, soft, nontender, and nondistended.  Bowel sounds present x4. EXTREMITIES:  Warm and dry.  No clubbing or cyanosis.  Chronic venous stasis changes and left greater than right lower extremity edema with 2+ and left 1+ on the right.  Distal pulses are 1+ and equal bilaterally.  Chest x-ray yesterday showed acute pulmonary edema.  EKG shows sinus tachycardia, rate of 106, normal axis.  He has ST depression and T-wave inversion in II, III, aVF, V5, and V6 which is unchanged from 2010 EKG. Hemoglobin 9.2, hematocrit 28.5, WBC 6.0, and platelets 525.  Sodium 140, potassium 2.8, chloride 100, CO2 of 32, BUN 11, creatinine 0.9, glucose 100, total bilirubin 0.2, alkaline phosphatase 92, AST 17, ALT 13, total protein 6.6, albumin 2.7.  BNP 2547, troponin I of 0.01. Fecal occult blood was negative.  Calcium 9.1, magnesium 1.7, phosphorus 3.9.  ASSESSMENT: 1. Acute systolic and diastolic congestive heart failure/pulmonary     edema.  The patient underwent echocardiogram this morning showing     significantly reduced left ventricular function.  He previously     normal ejection fraction in 2010.  He is diuresing well and his     renal function is stable.  He could tolerate his Lasix 80 mg IV     b.i.d.  and continue beta-blocker and add low-dose ACE inhibitor     therapy.  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  He continues to tolerate beta-blocker and ACE     inhibitor with low threshold, add spirolactone and possibly     digoxin, pending on cardiac output of right heart cath. 2. Hemoptysis.  The patient has had low-volume hemoptysis with blood     streaks in his sputum.  This is most likely related to his     pulmonary edema,  though he does have a long smoking history and     reports 10-pound weight loss over the past month.  This will need     close attention as therapy progresses. 3. Ongoing tobacco abuse, cessation advised. 4. Ongoing ethyl alcohol abuse, cessation advised. 5. Marijuana abuse, cessation advised. 6. Hypokalemia.  The patient will require additional supplementation     and we will go ahead and write for 40 mEq b.i.d. in the setting of     ongoing IV diuresis.     Murray Hodgkins, ANP   ______________________________ Marijo Conception. Verl Blalock, MD, [facility]    CB/MEDQ  D:  12/28/2010  T:  12/28/2010  Job:  [id number]  Electronically Signed by Murray Hodgkins ANP on 01/03/2011 07:14:43 PM Electronically Signed by Jenell Milliner MD [facility] on 01/14/2011 01:51:24 PM

## 2011-01-16 ENCOUNTER — Emergency Department (HOSPITAL_COMMUNITY)
Admission: EM | Admit: 2011-01-16 | Discharge: 2011-01-16 | Disposition: A | Discharge status: Home / Self Care | Payer: Self-pay | Attending: Emergency Medicine | Admitting: Emergency Medicine

## 2011-01-16 DIAGNOSIS — M25469 Effusion, unspecified knee: Secondary | ICD-10-CM | POA: Insufficient documentation

## 2011-01-16 DIAGNOSIS — I2510 Atherosclerotic heart disease of native coronary artery without angina pectoris: Secondary | ICD-10-CM | POA: Insufficient documentation

## 2011-01-16 DIAGNOSIS — E119 Type 2 diabetes mellitus without complications: Secondary | ICD-10-CM | POA: Insufficient documentation

## 2011-01-16 DIAGNOSIS — F17200 Nicotine dependence, unspecified, uncomplicated: Secondary | ICD-10-CM | POA: Insufficient documentation

## 2011-01-16 DIAGNOSIS — M25569 Pain in unspecified knee: Secondary | ICD-10-CM | POA: Insufficient documentation

## 2011-01-17 NOTE — Cardiovascular Report (Signed)
  Travis [name], Travis [name].:  [id number]  MEDICAL RECORD [name].:  [id number]  LOCATION:  [id number]                         FACILITY:  [facility]  PHYSICIAN:  Loralie Champagne, MD      DATE OF BIRTH:  [birth year]  DATE OF PROCEDURE:  12/29/2010 DATE OF DISCHARGE:                           CARDIAC CATHETERIZATION   PROCEDURES: 1. Left heart catheterization. 2. Coronary angiography.  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  However, given his demographics it was thought we needed to rule out coronary artery disease.  PROCEDURE NOTE:  After informed consent was obtained, the patient underwent Allen testing of his right wrist.  The right ulnar artery gave good collateral circulation of the radial side of the hand constituting positive Allen's test.  The right radial area was sterilely prepped and draped.  Lidocaine 1% was used to locally anesthetize right radial area. The right radial artery was entered using modified Seldinger technique and a 5-French arterial sheath was placed.  The patient received 3 mg intraarterial verapamil 4000 units IV heparin.  The right coronary artery was engaged using the JR-4 catheter.  The left coronary artery was engaged using the JL-3.5 catheter and the left ventricle was entered using angled pigtail catheter.  Left ventriculography was not done as the patient had a recent high-quality echo.  FINDINGS: 1. Hemodynamics, LV 132/31, aorta 126/83. 2. Left ventriculography was not done as the patient had a recent high-     quality echocardiogram. 3. Right coronary artery.  The right coronary was a large dominant     vessel with [name] angiographic coronary disease. 4. Left main.  Left main had [name] angiographic coronary disease. 5. Left circumflex system.  There is a small ramus and a large first     obtuse marginal.  There was [name] angiographic  coronary artery disease     in the circumflex system. 6. LAD system.  There was a 20% proximal LAD stenosis at the takeoff     of the moderate-sized first diagonal.  The remainder of the LAD had     [name] significant disease.  IMPRESSION:  The patient has minimal coronary artery disease.  His cardiomyopathy appears to be nonischemic.  He continues to have elevated filling pressures with LV end-diastolic pressure of 31 mmHg.  We will continue his IV diuresis with Lasix 80 mg IV b.i.d.      Loralie Champagne, MD     DM/MEDQ  D:  12/29/2010  T:  12/30/2010  Job:  [id number]  cc:   Thomas C. Verl Blalock, MD, [facility]  Electronically Signed by Loralie Champagne MD on 01/17/2011 08:06:44 PM

## 2011-01-18 ENCOUNTER — Ambulatory Visit (HOSPITAL_COMMUNITY)
Admission: RE | Admit: 2011-01-18 | Discharge: 2011-01-18 | Disposition: A | Discharge status: Home / Self Care | Payer: Self-pay | Source: Ambulatory Visit | Attending: Internal Medicine | Admitting: Internal Medicine

## 2011-01-18 DIAGNOSIS — F17200 Nicotine dependence, unspecified, uncomplicated: Secondary | ICD-10-CM | POA: Insufficient documentation

## 2011-01-18 DIAGNOSIS — I5022 Chronic systolic (congestive) heart failure: Secondary | ICD-10-CM | POA: Insufficient documentation

## 2011-01-18 LAB — BASIC METABOLIC PANEL
BUN: 30 mg/dL — ABNORMAL HIGH (ref 6–23)
Calcium: 10.4 mg/dL (ref 8.4–10.5)
Creatinine, Ser: 1.23 mg/dL (ref 0.50–1.35)
GFR calc Af Amer: 75 mL/min — ABNORMAL LOW (ref >90)
GFR calc non Af Amer: 64 mL/min — ABNORMAL LOW (ref >90)

## 2011-01-18 MED ORDER — CARVEDILOL 6.25 MG PO TABS: ORAL_TABLET | ORAL | Status: DC

## 2011-01-18 NOTE — Progress Notes (Signed)
  HPI:  [name] is a 55 y/o male with a h/o ETOH and tobacco abuse as well as severe osteoarthritis. He was admitted to [facility] from 10/3-10/6 with acute systolic HF.   Echo showed EF 20-25% with global HK. Cath showed minimal non-obstructive CAD. He was diuresed and started on appropriate medical therapy. Weight on admission was 77kg and he was discharged at [facility]. Returns for hospital f/u.   He is using crutches to ambulate due to RLE.  He complains of R knee pain and takes percocet. Denies SOB, edema, orthopnea or PND. He is taking all heart medications.  He is not weighing. Last weight was 158 pounds.  + arthritis pain. [name] No palpitations. Unemployed and lives with his brother. Follow low salt diet. Continues to smoke 5 cigarettes day.       ROS: All systems negative except as listed in HPI, PMH and Problem List.  Past Medical History  Diagnosis Date  . Systolic heart failure     EF 20-25% by echo 12/28/10  . Nonischemic cardiomyopathy     minimal coronary disease by cath 12/29/10  . Hemoptysis     secondary to pulmonary edema  . Hyperlipidemia   . Chronic anemia   . GI bleed     15 years ago  . Tobacco abuse   . Alcohol abuse   . Osteoarthritis   . Diabetes mellitus, type 2     Current Outpatient Prescriptions  Medication Sig Dispense Refill  . carvedilol (COREG) 6.25 MG tablet Take 6.25 mg by mouth 2 (two) times daily with a meal.        . digoxin (LANOXIN) 0.25 MG tablet Take 250 mcg by mouth daily.        . furosemide (LASIX) 40 MG tablet Take 40 mg by mouth 2 (two) times daily.        [name] lisinopril (PRINIVIL,ZESTRIL) 10 MG tablet Take 10 mg by mouth 2 (two) times daily.        [name] spironolactone (ALDACTONE) 25 MG tablet Take 25 mg by mouth daily.        [name] oxyCODONE-acetaminophen (PERCOCET) 5-325 MG per tablet Take 1 tablet by mouth every 4 (four) hours as needed.           PHYSICAL EXAM: Filed Vitals:   01/18/11 1542  BP: 96/58  Pulse: 90   General:  Well  appearing. No resp difficulty HEENT: normal except for poor dentition. Neck: supple. JVP flat. Carotids 2+ bilaterally; no bruits. No lymphadenopathy or thryomegaly appreciated. Cor: PMI normal. Regular rate & rhythm. No rubs, gallops or murmurs. Lungs: coarse through out.  Abdomen: soft, nontender, nondistended. No hepatosplenomegaly. No bruits or masses. Good bowel sounds. Extremities: no cyanosis, clubbing, rash, edema Neuro: alert & orientedx3, cranial nerves grossly intact. Moves all 4 extremities w/o difficulty. Affect pleasant.     ASSESSMENT & PLAN:

## 2011-01-18 NOTE — Assessment & Plan Note (Addendum)
NYHA II. Volume status stable. Tolerating medications. Will increase Carvedilol 9.375  mg bid. Will recheck BMET today. Follow up in 2 weeks for additional medication titration.    Patient seen and examined with Darrick Grinder, NP. We discussed all aspects of the encounter. I agree with the assessment and plan as stated above.

## 2011-01-18 NOTE — Patient Instructions (Signed)
Take Coreg 1 1/2 tabs in the morning and at night.  Please follow up in 2 weeks  Please weigh daily

## 2011-01-18 NOTE — Assessment & Plan Note (Signed)
Discussed smoking cessation however he declines. At this time.

## 2011-01-22 NOTE — Progress Notes (Signed)
Patient seen and examined with Amy Clegg, NP. We discussed all aspects of the encounter. I agree with the assessment and plan as stated above.   

## 2011-01-24 ENCOUNTER — Telehealth (HOSPITAL_COMMUNITY): Payer: Self-pay | Admitting: *Deleted

## 2011-01-24 MED ORDER — FUROSEMIDE 40 MG PO TABS: 40.0000 mg | ORAL_TABLET | Freq: Every day | ORAL | Status: DC

## 2011-01-24 NOTE — Telephone Encounter (Signed)
Pt aware.

## 2011-01-24 NOTE — Telephone Encounter (Signed)
Message copied by Scarlette Calico on Wed Jan 24, 2011 12:09 PM ------      Message from: Jolaine Artist      Created: Sun Jan 21, 2011  6:14 PM       He is starting to get a little dry. Cut lasix back to 40 daily. If weight increasing can take extra lasix. Also cut potassium in half if he is taking.

## 2011-01-29 ENCOUNTER — Emergency Department (HOSPITAL_COMMUNITY)
Admission: EM | Admit: 2011-01-29 | Discharge: 2011-01-29 | Disposition: A | Discharge status: Home / Self Care | Payer: Medicaid Other | Attending: Emergency Medicine | Admitting: Emergency Medicine

## 2011-01-29 ENCOUNTER — Emergency Department (HOSPITAL_COMMUNITY): Payer: Medicaid Other

## 2011-01-29 ENCOUNTER — Encounter (HOSPITAL_COMMUNITY): Payer: Self-pay | Admitting: *Deleted

## 2011-01-29 DIAGNOSIS — E119 Type 2 diabetes mellitus without complications: Secondary | ICD-10-CM | POA: Insufficient documentation

## 2011-01-29 DIAGNOSIS — M25461 Effusion, right knee: Secondary | ICD-10-CM

## 2011-01-29 DIAGNOSIS — Z79899 Other long term (current) drug therapy: Secondary | ICD-10-CM | POA: Insufficient documentation

## 2011-01-29 DIAGNOSIS — M1990 Unspecified osteoarthritis, unspecified site: Secondary | ICD-10-CM | POA: Insufficient documentation

## 2011-01-29 DIAGNOSIS — F17200 Nicotine dependence, unspecified, uncomplicated: Secondary | ICD-10-CM | POA: Insufficient documentation

## 2011-01-29 DIAGNOSIS — M25569 Pain in unspecified knee: Secondary | ICD-10-CM | POA: Insufficient documentation

## 2011-01-29 DIAGNOSIS — E785 Hyperlipidemia, unspecified: Secondary | ICD-10-CM | POA: Insufficient documentation

## 2011-01-29 MED ORDER — OXYCODONE-ACETAMINOPHEN 5-325 MG PO TABS
2.0000 | ORAL_TABLET | Freq: Once | ORAL | Status: AC
  Administered 2011-01-29: 2 via ORAL
  Filled 2011-01-29: qty 2

## 2011-01-29 MED ORDER — LIDOCAINE-EPINEPHRINE (PF) 1 %-1:200000 IJ SOLN
INTRAMUSCULAR | Status: AC
  Filled 2011-01-29: qty 10

## 2011-01-29 MED ORDER — OXYCODONE-ACETAMINOPHEN 5-325 MG PO TABS: 1.0000 | ORAL_TABLET | ORAL | Status: AC | PRN

## 2011-01-29 MED ORDER — LIDOCAINE-EPINEPHRINE (PF) 2 %-1:200000 IJ SOLN
10.0000 mL | Freq: Once | INTRAMUSCULAR | Status: AC
  Administered 2011-01-29: 10 mL
  Filled 2011-01-29: qty 10

## 2011-01-29 NOTE — ED Provider Notes (Signed)
History     CSN: [id number] Arrival date & time: 01/29/2011  2:44 PM   First MD Initiated Contact with Patient 01/29/11 1615      Chief Complaint  Patient presents with  . Knee Pain    pt c/o right knee pain, reports swelling that began yesterday. pt states "I'm here to make sure I don't have an infection in there."     Patient is a 54 y.o. male presenting with knee pain.  Knee Pain This is a chronic problem. The problem occurs constantly. The problem has been gradually worsening. Associated symptoms include joint swelling. Pertinent negatives include no numbness or weakness. The symptoms are aggravated by bending, standing and walking (Palpation). He has tried NSAIDs, ice and relaxation for the symptoms. The treatment provided no relief.   patient reports similar symptoms for 14 years. States he was here last month and had an arthrocentesis done. Reports no indication of an infected knee at that time. Reports he has been unable to followup with orthopedic physician due to lack of money. Denies reinjury of knee, erythema, fever.   Past Medical History  Diagnosis Date  . Systolic heart failure     EF 20-25% by echo 12/28/10  . Nonischemic cardiomyopathy     minimal coronary disease by cath 12/29/10  . Hemoptysis     secondary to pulmonary edema  . Hyperlipidemia   . Chronic anemia   . GI bleed     15 years ago  . Tobacco abuse   . Alcohol abuse   . Osteoarthritis   . Diabetes mellitus, type 2     History reviewed. No pertinent past surgical history.  History reviewed. No pertinent family history.  History  Substance Use Topics  . Smoking status: Current Everyday Smoker  . Smokeless tobacco: Not on file  . Alcohol Use: No      Review of Systems  Musculoskeletal: Positive for joint swelling. Negative for back pain.       Knee pain  Neurological: Negative for weakness and numbness.  All other systems reviewed and are negative.    Allergies  Review of patient's  allergies indicates no known allergies.  Home Medications   Current Outpatient Rx  Name Route Sig Dispense Refill  . CARVEDILOL 6.25 MG PO TABS  Take 1 1/2 tabs in the am and pm 90 tablet 6  . DIGOXIN 0.25 MG PO TABS Oral Take 250 mcg by mouth daily.      . FUROSEMIDE 40 MG PO TABS Oral Take 1 tablet (40 mg total) by mouth daily.    [name] LISINOPRIL 10 MG PO TABS Oral Take 10 mg by mouth 2 (two) times daily.      . OXYCODONE-ACETAMINOPHEN 5-325 MG PO TABS Oral Take 1 tablet by mouth every 4 (four) hours as needed.      [name] SPIRONOLACTONE 25 MG PO TABS Oral Take 25 mg by mouth daily.        BP 117/62  Pulse 81  Temp(Src) 98.3 F (36.8 C) (Oral)  Resp 18  Wt 149 lb (67.586 kg)  SpO2 100%  Physical Exam  Constitutional: He is oriented to person, place, and time. He appears well-developed and well-nourished.  HENT:  Head: Normocephalic and atraumatic.  Eyes: Pupils are equal, round, and reactive to light.  Musculoskeletal:       Right knee: He exhibits decreased range of motion, swelling and effusion. He exhibits no deformity, no laceration, no erythema, normal alignment, no LCL laxity and  normal patellar mobility.  Neurological: He is alert and oriented to person, place, and time.  Skin: Skin is warm and dry. No rash noted. No erythema. No pallor.  Psychiatric: He has a normal mood and affect. His behavior is normal.    ED Course  ARTHOCENTESIS Date/Time: 01/29/2011 6:30 PM Performed by: Sheliah Mends Authorized by: Sheliah Mends Consent: Verbal consent obtained. Risks and benefits: risks, benefits and alternatives were discussed Consent given by: patient Patient understanding: patient states understanding of the procedure being performed Patient consent: the patient's understanding of the procedure matches consent given Site marked: the operative site was marked Imaging studies: imaging studies available Patient identity confirmed: verbally with patient Time out:  Immediately prior to procedure a "time out" was called to verify the correct patient, procedure, equipment, support staff and site/side marked as required. Indications: joint swelling and pain  Body area: knee Joint: right knee Local anesthesia used: yes Local anesthetic: lidocaine 2% with epinephrine Anesthetic total: 5 ml Patient sedated: no Preparation: Patient was prepped and draped in the usual sterile fashion. Needle gauge: 18 G Approach: medial Aspirate: yellow Aspirate amount: 100 ml Lidocaine 2% amount: 5 ml Patient tolerance: Patient tolerated the procedure well with no immediate complications. Comments: Patient reports having several knee immobilizers at home. Tolerated procedure well. Patient states pain is already improved.   (including critical care time)  Labs Reviewed - No data to display Dg Knee Complete 4 Views Right  01/29/2011  *RADIOLOGY REPORT*  Clinical Data: Swelling around the knee, pain, no acute injury  RIGHT KNEE - COMPLETE 4+ VIEW  Comparison: Right knee films of 12/27/2010  Findings: There is tricompartmental degenerative joint disease with the most significant loss of joint space laterally.  There is sclerosis and spur formation present.  No fracture is seen.  There does appear to be a moderate sized right knee joint effusion present.  IMPRESSION: Tricompartmental degenerative joint disease with moderate sized right knee joint effusion.  Original Report Authenticated By: Joretta Bachelor, M.D.        MDM          Sheliah Mends, [place] 01/29/11 2000

## 2011-01-30 NOTE — ED Provider Notes (Signed)
Medical screening examination/treatment/procedure(s) were performed by non-physician practitioner and as supervising physician I was immediately available for consultation/collaboration.   Lezlie Octave, MD 01/30/11 [phone]

## 2011-02-01 ENCOUNTER — Ambulatory Visit (HOSPITAL_COMMUNITY)
Admission: RE | Admit: 2011-02-01 | Discharge: 2011-02-01 | Disposition: A | Discharge status: Home / Self Care | Payer: Medicaid Other | Source: Ambulatory Visit | Attending: Internal Medicine | Admitting: Internal Medicine

## 2011-02-01 VITALS — BP 88/44 | HR 88 | Wt 159.0 lb

## 2011-02-01 DIAGNOSIS — R0600 Dyspnea, unspecified: Secondary | ICD-10-CM

## 2011-02-01 DIAGNOSIS — R0609 Other forms of dyspnea: Secondary | ICD-10-CM | POA: Insufficient documentation

## 2011-02-01 DIAGNOSIS — R0989 Other specified symptoms and signs involving the circulatory and respiratory systems: Secondary | ICD-10-CM | POA: Insufficient documentation

## 2011-02-01 DIAGNOSIS — I5022 Chronic systolic (congestive) heart failure: Secondary | ICD-10-CM

## 2011-02-01 LAB — BASIC METABOLIC PANEL
Calcium: 9.8 mg/dL (ref 8.4–10.5)
Creatinine, Ser: 1.65 mg/dL — ABNORMAL HIGH (ref 0.50–1.35)

## 2011-02-01 MED ORDER — LISINOPRIL 10 MG PO TABS: 10.0000 mg | ORAL_TABLET | Freq: Every day | ORAL | Status: DC

## 2011-02-01 MED ORDER — FUROSEMIDE 40 MG PO TABS: 40.0000 mg | ORAL_TABLET | Freq: Two times a day (BID) | ORAL | Status: DC

## 2011-02-01 NOTE — Progress Notes (Signed)
  HPI:  Travis [name] is a 55 y/o male with a h/o ETOH and tobacco abuse as well as severe osteoarthritis. He was admitted to [facility] from 10/3-10/6 with acute systolic HF.   Echo showed EF 20-25% with global HK. Cath showed minimal non-obstructive CAD. He was diuresed and started on appropriate medical therapy. Weight on admission was 77kg and he was discharged at [place].  He returns for follow up today.  He feels ok but is complaining of R knee pain.  He had it drained by the ER 3 days ago and is taking percocet with SE of constipation.  Since last visit he was suppose to decrease his lasix to 40 mg daily but was confused and decreased lisinopril to 10 mg daily and continued lasix 40 mg BID.  To complicate the issue he had mixed his coreg pills with his lisinopril tablets.  He was taking his coreg correctly.  He has recent complaints of dizziness.  He is not weighing himself because he forgets to pick up batteries.  He denies SOB, edema, orthopnea or PND.  Continues to smoke several cigs a day.  He has noted increased fluid intake.     ROS: All systems negative except as listed in HPI, PMH and Problem List.  Past Medical History  Diagnosis Date  . Systolic heart failure     EF 20-25% by echo 12/28/10  . Nonischemic cardiomyopathy     minimal coronary disease by cath 12/29/10  . Hemoptysis     secondary to pulmonary edema  . Hyperlipidemia   . Chronic anemia   . GI bleed     15 years ago  . Tobacco abuse   . Alcohol abuse   . Osteoarthritis   . Diabetes mellitus, type 2     Current Outpatient Prescriptions  Medication Sig Dispense Refill  . Aspirin-Caffeine (BAYER BACK & BODY PAIN EX ST) 500-32.5 MG TABS Take 1 tablet by mouth daily as needed. For leg pain (when no percocet)      . carvedilol (COREG) 6.25 MG tablet Take 1 1/2 tabs in the am and pm  90 tablet  6  . digoxin (LANOXIN) 0.25 MG tablet Take 250 mcg by mouth daily.        . furosemide (LASIX) 40 MG tablet Take 1 tablet (40 mg  total) by mouth daily.      [name] lisinopril (PRINIVIL,ZESTRIL) 10 MG tablet Take 10 mg by mouth 2 (two) times daily.        [name] oxyCODONE-acetaminophen (PERCOCET) 5-325 MG per tablet Take 1 tablet by mouth every 4 (four) hours as needed for pain.  30 tablet  0  . spironolactone (ALDACTONE) 25 MG tablet Take 25 mg by mouth daily.           PHYSICAL EXAM: Filed Vitals:   02/01/11 1447  BP: 88/44  Pulse: 88  Wt: 159  General:  Well appearing. No resp difficulty HEENT: normal except for poor dentition. Neck: supple. JVP 8-9. Carotids 2+ bilaterally; no bruits. No lymphadenopathy or thryomegaly appreciated. Cor: PMI normal. Regular rate & rhythm. No rubs, gallops or murmurs. Lungs: coarse through out.  Abdomen: soft, nontender, nondistended. No hepatosplenomegaly. No bruits or masses. Good bowel sounds. Extremities: no cyanosis, clubbing, rash, trace edema, Rt knee enlarged and tender to touch Neuro: alert & orientedx3, cranial nerves grossly intact. Moves all 4 extremities w/o difficulty. Affect pleasant.     ASSESSMENT & PLAN:

## 2011-02-01 NOTE — Assessment & Plan Note (Addendum)
NYHA III-IIIb but functional status difficult to assess secondary to knee pain.  Fluid status increased now in the setting of hypotension may represent worsening heart failure.  Will continue lasix 40 mg BID, keep lisinopril at 10 mg daily with hypotension.  Watch fluid intake closely and monitor for dizziness or syncope.  Check labs today.

## 2011-02-01 NOTE — Patient Instructions (Signed)
Decrease lisinopril to 10 mg daily.  Continue other medications.  Try and weigh yourself daily and record daily weights.    Return for follow up with Dr. Haroldine Laws in 2 weeks.    Watch fluid intake.    Call if weakness or dizziness occur.    Labs today.

## 2011-02-12 ENCOUNTER — Ambulatory Visit (HOSPITAL_COMMUNITY)
Admission: RE | Admit: 2011-02-12 | Discharge: 2011-02-12 | Disposition: A | Discharge status: Home / Self Care | Payer: Medicaid Other | Source: Ambulatory Visit | Attending: Internal Medicine | Admitting: Internal Medicine

## 2011-02-12 VITALS — BP 86/58 | HR 80 | Wt 158.8 lb

## 2011-02-12 DIAGNOSIS — I5022 Chronic systolic (congestive) heart failure: Secondary | ICD-10-CM | POA: Insufficient documentation

## 2011-02-12 LAB — BASIC METABOLIC PANEL
BUN: 16 mg/dL (ref 6–23)
Chloride: 95 mEq/L — ABNORMAL LOW (ref 96–112)
GFR calc Af Amer: 74 mL/min — ABNORMAL LOW (ref >90)
Glucose, Bld: 110 mg/dL — ABNORMAL HIGH (ref 70–99)
Potassium: 3.6 mEq/L (ref 3.5–5.1)
Sodium: 140 mEq/L (ref 135–145)

## 2011-02-12 NOTE — Patient Instructions (Signed)
Follow up in one month.  Please weigh daily

## 2011-02-12 NOTE — Progress Notes (Signed)
   HPI:  Mr. Kadri is a 55 y/o male with a h/o ETOH and tobacco abuse as well as severe osteoarthritis. He was admitted to [facility] from 10/3-10/6 with acute systolic HF.   Echo showed EF 20-25% with global HK. Cath showed minimal non-obstructive CAD. He was diuresed and started on appropriate medical therapy. Weight on admission was 77kg and he was discharged at [place].  He returns for follow up today. R knee pain improved. Able to ambulate without crutches. Decreased edema.  He has had friend help with medications.  Occasional dizziness.  He has been weighing at home because he is out of batteries.   He denies SOB, edema, orthopnea or PND.  Continues to smoke 4 cigs a day.  He is not limiting fluid intake. Lives with his brother.    ROS: All systems negative except as listed in HPI, PMH and Problem List.  Past Medical History  Diagnosis Date  . Systolic heart failure     EF 20-25% by echo 12/28/10  . Nonischemic cardiomyopathy     minimal coronary disease by cath 12/29/10  . Hemoptysis     secondary to pulmonary edema  . Hyperlipidemia   . Chronic anemia   . GI bleed     15 years ago  . Tobacco abuse   . Alcohol abuse   . Osteoarthritis   . Diabetes mellitus, type 2     Current Outpatient Prescriptions  Medication Sig Dispense Refill  . Aspirin-Caffeine (BAYER BACK & BODY PAIN EX ST) 500-32.5 MG TABS Take 1 tablet by mouth daily as needed. For leg pain (when no percocet)      . carvedilol (COREG) 6.25 MG tablet Take 1 1/2 tabs in the am and pm  90 tablet  6  . digoxin (LANOXIN) 0.25 MG tablet Take 250 mcg by mouth daily.        . furosemide (LASIX) 40 MG tablet Take 1 tablet (40 mg total) by mouth 2 (two) times daily.      [name] lisinopril (PRINIVIL,ZESTRIL) 10 MG tablet Take 1 tablet (10 mg total) by mouth daily.      [name] spironolactone (ALDACTONE) 25 MG tablet Take 25 mg by mouth daily.           PHYSICAL EXAM: Filed Vitals:   02/12/11 1320  BP: 86/58  Pulse: 80  Wt: 158   (159)  General:  Well appearing. No resp difficulty HEENT: normal except for poor dentition. Neck: supple. JVP 10-11 Carotids 2+ bilaterally; no bruits. No lymphadenopathy or thryomegaly appreciated. Cor: PMI normal. Regular rate & rhythm. No rubs, gallops or murmurs. Lungs: CTA  Abdomen: soft, nontender, nondistended. No hepatosplenomegaly. No bruits or masses. Good bowel sounds. Extremities: no cyanosis, clubbing, rash, trace edema, Rt knee enlarged and tender to touch Neuro: alert & orientedx3, cranial nerves grossly intact. Moves all 4 extremities w/o difficulty. Affect pleasant.     ASSESSMENT & PLAN:

## 2011-02-12 NOTE — Assessment & Plan Note (Addendum)
NYHA III. Volume status remains elevated.Will not adjust medications due to soft BP.  Continue current medical regimen. Check BMET. [facility] to provide medications. He will follow up with [facility] for additional medications after he is approved. Follow up in 3-4 weeks.   Patient seen and examined with Darrick Grinder, NP. We discussed all aspects of the encounter. I agree with the assessment and plan as stated above.

## 2011-02-13 ENCOUNTER — Encounter (HOSPITAL_COMMUNITY): Payer: Self-pay

## 2011-02-15 NOTE — Progress Notes (Signed)
Patient seen and examined with Leone Haven PA-C. We discussed all aspects of the encounter. I agree with the assessment and plan as stated below.

## 2011-02-15 NOTE — Progress Notes (Signed)
Patient seen and examined with Darrick Grinder, NP. We discussed all aspects of the encounter. I agree with the assessment and plan as stated below.

## 2011-03-12 ENCOUNTER — Ambulatory Visit (HOSPITAL_COMMUNITY)
Admission: RE | Admit: 2011-03-12 | Discharge: 2011-03-12 | Disposition: A | Discharge status: Home / Self Care | Payer: Medicaid Other | Source: Ambulatory Visit | Attending: Internal Medicine | Admitting: Internal Medicine

## 2011-03-12 ENCOUNTER — Encounter (HOSPITAL_COMMUNITY): Payer: Self-pay

## 2011-03-12 VITALS — BP 104/48 | HR 92 | Wt 158.2 lb

## 2011-03-12 DIAGNOSIS — I5022 Chronic systolic (congestive) heart failure: Secondary | ICD-10-CM

## 2011-03-12 DIAGNOSIS — J209 Acute bronchitis, unspecified: Secondary | ICD-10-CM

## 2011-03-12 MED ORDER — DOXYCYCLINE HYCLATE 50 MG PO CAPS: 100.0000 mg | ORAL_CAPSULE | Freq: Two times a day (BID) | ORAL | Status: AC

## 2011-03-12 NOTE — Progress Notes (Signed)
Encounter addended by: Jolaine Artist, MD on: 03/12/2011  3:57 PM<BR>     Documentation filed: Notes Section

## 2011-03-12 NOTE — Assessment & Plan Note (Signed)
Still with NYHA III symptoms. Volume status minimally elevated. I am anxious to titrate meds but given active URI and borderline BP will hold off until next visit. We called pharmacy to try and get him another supply of free medications.

## 2011-03-12 NOTE — Assessment & Plan Note (Signed)
Suspect COPD flare. Start doxy 100 bid x 1 week. Encouraged to stop smoking.

## 2011-03-12 NOTE — Progress Notes (Addendum)
   HPI:  Mr. Brouillet is a 55 y/o male with a h/o ETOH and tobacco abuse as well as severe osteoarthritis. He was admitted to [facility] from 10/3-10/6 with acute systolic HF.   Echo showed EF 20-25% with global HK. Cath showed minimal non-obstructive CAD. He was diuresed and started on appropriate medical therapy. Weight on admission was 77kg and he was discharged at [place].  We saw him 3 or 4 weeks ago. Was feeling dizzy BP in the 80s so medicines not titrated.  Returns for f/u. Not feeling well due to URI.No f/c. +yellow sputum, Still feels swimmy headed.Says prior to getting his cold he was starting to feel much better.  Weight stable. No edema, orthopnea or PND. Since he has had the cold has felt swimmy headed but no syncope. Taking meds but only has 2 days left. Continues to smoke 3 cigs a day. Lives with his brother.    ROS: All systems negative except as listed in HPI, PMH and Problem List.  Past Medical History  Diagnosis Date  . Systolic heart failure     EF 20-25% by echo 12/28/10  . Nonischemic cardiomyopathy     minimal coronary disease by cath 12/29/10  . Hemoptysis     secondary to pulmonary edema  . Hyperlipidemia   . Chronic anemia   . GI bleed     15 years ago  . Tobacco abuse   . Alcohol abuse   . Osteoarthritis   . Diabetes mellitus, type 2     Current Outpatient Prescriptions  Medication Sig Dispense Refill  . Aspirin-Caffeine (BAYER BACK & BODY PAIN EX ST) 500-32.5 MG TABS Take 1 tablet by mouth daily as needed. For leg pain (when no percocet)      . carvedilol (COREG) 6.25 MG tablet Take 1 1/2 tabs in the am and pm  90 tablet  6  . digoxin (LANOXIN) 0.25 MG tablet Take 250 mcg by mouth daily.        . furosemide (LASIX) 40 MG tablet Take 1 tablet (40 mg total) by mouth 2 (two) times daily.      [name] lisinopril (PRINIVIL,ZESTRIL) 10 MG tablet Take 1 tablet (10 mg total) by mouth daily.      [name] spironolactone (ALDACTONE) 25 MG tablet Take 25 mg by mouth daily.             PHYSICAL EXAM: Filed Vitals:   03/12/11 1454  BP: 104/48  Pulse: 92  Wt: 158  (159)  General:  Congested. fatigued appearing. HEENT: normal except for poor dentition. Neck: supple. JVP 10-11 Carotids 2+ bilaterally; no bruits. No lymphadenopathy or thryomegaly appreciated. Cor: PMI normal. Regular rate & rhythm. No rubs, gallops or murmurs. Lungs: LLL rhonchi/crackles Abdomen: soft, nontender, nondistended. No hepatosplenomegaly. No bruits or masses. Good bowel sounds. Extremities: no cyanosis, clubbing, rash, trace edema, Rt knee enlarged and tender to touch Neuro: alert & orientedx3, cranial nerves grossly intact. Moves all 4 extremities w/o difficulty. Affect pleasant.     ASSESSMENT & PLAN:

## 2011-03-12 NOTE — Patient Instructions (Signed)
We have given you Doxycycline 100 mg Twice daily for 7 days  Your physician recommends that you schedule a follow-up appointment in: 3 weeks

## 2011-03-30 ENCOUNTER — Emergency Department (HOSPITAL_COMMUNITY): Payer: Medicaid Other

## 2011-03-30 ENCOUNTER — Encounter (HOSPITAL_COMMUNITY): Payer: Self-pay | Admitting: *Deleted

## 2011-03-30 ENCOUNTER — Emergency Department (HOSPITAL_COMMUNITY)
Admission: EM | Admit: 2011-03-30 | Discharge: 2011-03-30 | Disposition: A | Discharge status: Home / Self Care | Payer: Medicaid Other | Attending: Emergency Medicine | Admitting: Emergency Medicine

## 2011-03-30 DIAGNOSIS — M7030 Other bursitis of elbow, unspecified elbow: Secondary | ICD-10-CM

## 2011-03-30 DIAGNOSIS — M25529 Pain in unspecified elbow: Secondary | ICD-10-CM | POA: Insufficient documentation

## 2011-03-30 DIAGNOSIS — M7020 Olecranon bursitis, unspecified elbow: Secondary | ICD-10-CM | POA: Insufficient documentation

## 2011-03-30 DIAGNOSIS — E119 Type 2 diabetes mellitus without complications: Secondary | ICD-10-CM | POA: Insufficient documentation

## 2011-03-30 MED ORDER — OXYCODONE-ACETAMINOPHEN 5-325 MG PO TABS: ORAL_TABLET | ORAL | Status: AC

## 2011-03-30 MED ORDER — IBUPROFEN 800 MG PO TABS: 800.0000 mg | ORAL_TABLET | Freq: Three times a day (TID) | ORAL | Status: AC

## 2011-03-30 MED ORDER — OXYCODONE-ACETAMINOPHEN 5-325 MG PO TABS
2.0000 | ORAL_TABLET | Freq: Once | ORAL | Status: AC
  Administered 2011-03-30: 2 via ORAL
  Filled 2011-03-30: qty 2

## 2011-03-30 MED ORDER — IBUPROFEN 800 MG PO TABS
800.0000 mg | ORAL_TABLET | Freq: Once | ORAL | Status: AC
  Administered 2011-03-30: 800 mg via ORAL
  Filled 2011-03-30: qty 1

## 2011-03-30 NOTE — ED Provider Notes (Signed)
History     CSN: [id number]  Arrival date & time 03/30/11  1103   First MD Initiated Contact with Patient 03/30/11 1119      Chief Complaint  Patient presents with  . Arm Pain    pt c/o arm pain to left arm for 2-3 days. states "I think I have fluid on it or infection." pts left elbow swollen and painful to touch.     (Consider location/radiation/quality/duration/timing/severity/associated sxs/prior treatment) HPI  Patient presents to emergency department complaining of a few day history of gradual onset left elbow swelling with increasing swelling and pain in the last few days. Patient denies history of similar. Patient states that the posterior aspect of his elbow has begun to swell and has continued to increase over last few days with decreased range of motion due to pain. Patient states he felt feverish yesterday but no known temperature. Patient states he's been followed by the outpatient clinics at [facility], as well as [facility] the past as primary care. Patient is taking Tylenol at home without any relief of the pain. Pain is aggravated by movement and touch. Denies alleviating factors. Patient does have history of knee arthritis, pain and intermittent swelling. She denies seeing an orthopedic specialist in the past. Patient denies known injury to elbow.  Past Medical History  Diagnosis Date  . Systolic heart failure     EF 20-25% by echo 12/28/10  . Nonischemic cardiomyopathy     minimal coronary disease by cath 12/29/10  . Hemoptysis     secondary to pulmonary edema  . Hyperlipidemia   . Chronic anemia   . GI bleed     15 years ago  . Tobacco abuse   . Alcohol abuse   . Osteoarthritis   . Diabetes mellitus, type 2     History reviewed. No pertinent past surgical history.  History reviewed. No pertinent family history.  History  Substance Use Topics  . Smoking status: Current Everyday Smoker    Types: Cigarettes  . Smokeless tobacco: Not on file  . Alcohol  Use: No      Review of Systems  All other systems reviewed and are negative.    Allergies  Review of patient's allergies indicates no known allergies.  Home Medications   Current Outpatient Rx  Name Route Sig Dispense Refill  . ASPIRIN-CAFFEINE 500-32.5 MG PO TABS Oral Take 1 tablet by mouth daily as needed. For leg pain (when no percocet)    . CARVEDILOL 6.25 MG PO TABS  Take 1 1/2 tabs in the am and pm 90 tablet 6  . DIGOXIN 0.25 MG PO TABS Oral Take 250 mcg by mouth daily.      . FUROSEMIDE 40 MG PO TABS Oral Take 1 tablet (40 mg total) by mouth 2 (two) times daily.    [name] LISINOPRIL 10 MG PO TABS Oral Take 1 tablet (10 mg total) by mouth daily.    [name] SPIRONOLACTONE 25 MG PO TABS Oral Take 25 mg by mouth daily.        BP 108/82  Pulse 103  Temp(Src) 98.9 F (37.2 C) (Oral)  Resp 18  SpO2 100%  Physical Exam  Nursing note and vitals reviewed. Constitutional: He is oriented to person, place, and time. He appears well-developed.  HENT:  Head: Normocephalic and atraumatic.  Eyes: Conjunctivae are normal.  Neck: Normal range of motion. Neck supple.  Cardiovascular: Normal rate and regular rhythm.  Exam reveals no gallop and no friction rub.  No murmur heard. Pulmonary/Chest: Effort normal and breath sounds normal. No respiratory distress. He has no wheezes. He has no rales. He exhibits no tenderness.  Musculoskeletal: Normal range of motion. He exhibits edema and tenderness.       Moderate swelling around the olecranon process of left elbow with tenderness to palpation. No erythema or heat of joints. The forearm and upper arm are both nontender. Good radial pulse and grip strength. Normal sensation of entire left upper trimming.  Neurological: He is alert and oriented to person, place, and time.  Skin: Skin is warm and dry. No rash noted. No erythema. No pallor.    ED Course  Procedures (including critical care time)  PO percocet and ibuprofen  Labs Reviewed - No data  to display Dg Elbow Complete Left  03/30/2011  *RADIOLOGY REPORT*  Clinical Data: Swelling and pain about the posterior aspect of the left elbow.  LEFT ELBOW - COMPLETE 3+ VIEW  Comparison: None.  Findings: The patient was unable to extend his elbow somewhat limiting the study.  There is marked soft tissue swelling about the olecranon consistent with olecranon bursitis.  The patient also has a moderate elbow joint effusion.  No fracture or dislocation is identified.  IMPRESSION:  1.  Moderate elbow joint effusion.  No fracture is identified although the study is limited due to the patient's inability to extend his arm. 2.  Marked olecranon bursitis.  Original Report Authenticated By: Arvid Right. D'ALESSIO, M.D.     1. Bursitis of elbow       MDM  Patient is afebrile and in no acute distress. Patient has clinical bursitis with bursitis seen on x-ray. No signs or symptoms of septic joint. Patient is agreeable to following up with orthopedic specialist for further evaluation of ongoing pain        Eben Burow, PA 03/30/11 1223

## 2011-03-30 NOTE — ED Notes (Signed)
Ice pack given with instructions

## 2011-03-31 NOTE — ED Provider Notes (Signed)
Medical screening examination/treatment/procedure(s) were performed by non-physician practitioner and as supervising physician I was immediately available for consultation/collaboration.   Maudry Diego, MD 03/31/11 [phone]

## 2011-04-02 ENCOUNTER — Ambulatory Visit (HOSPITAL_COMMUNITY)
Admission: RE | Admit: 2011-04-02 | Discharge: 2011-04-02 | Disposition: A | Discharge status: Home / Self Care | Payer: Medicaid Other | Source: Ambulatory Visit | Attending: Internal Medicine | Admitting: Internal Medicine

## 2011-04-02 VITALS — BP 82/58 | HR 75 | Wt 161.0 lb

## 2011-04-02 DIAGNOSIS — I5022 Chronic systolic (congestive) heart failure: Secondary | ICD-10-CM | POA: Insufficient documentation

## 2011-04-02 DIAGNOSIS — M109 Gout, unspecified: Secondary | ICD-10-CM | POA: Insufficient documentation

## 2011-04-02 NOTE — Patient Instructions (Signed)
Your physician recommends that you schedule a follow-up appointment in: 3-4 weeks  Do the following things EVERYDAY: 1) Weigh yourself in the morning before breakfast. Write it down and keep it in a log. 2) Take your medicines as prescribed 3) Eat low salt foods-Limit salt (sodium) to 2000mg  per day.  4) Stay as active as you can everyday

## 2011-04-02 NOTE — Assessment & Plan Note (Addendum)
NYHA III. Volume status mildly elevated. Will not titrate medications due to soft BP. Will check BMET , Dig level, BNP tomorrow. He will need ECHO in the next couple of months and CPX. Will follow up in 3 weeks.   Patient seen and examined with Darrick Grinder, NP. We discussed all aspects of the encounter. I agree with the assessment and plan as stated above. I remained worried about [name], Functional status remains compromised, BP is soft and has persistent s3. Unable to tolerate further med titration at this point due to BP. Will check labs and see him back soon. If not progressing will need CPX +/- RHC. We discussed my concern.Given smoking and social situation probably not a good candidate for advanced therapies. Knows to call immediately if feeling worse.

## 2011-04-02 NOTE — Assessment & Plan Note (Signed)
Encouraged to stop smoking however he declines.

## 2011-04-02 NOTE — Progress Notes (Signed)
Patient ID: Travis [name], male   DOB: 09/20/1955, 56 y.o.   MRN: [id number]   HPI:  Travis [name] is a 56 y/o male with a h/o ETOH and tobacco abuse as well as severe osteoarthritis. He was admitted to [facility] from 10/3-10/6 with acute systolic HF.   [id number] Echo showed EF 20-25% with global HK. Cath showed minimal non-obstructive CAD. He was diuresed and started on appropriate medical therapy. Weight on admission was 77kg and he was discharged at [facility].  We saw him 3 or 4 weeks ago. Was feeling dizzy BP in the 80s so medicines not titrated.  Returns for f/u. Feels fair. Complains of fatigue.Denies SOB/PND/Orthopnea. Denies dizziness.  Productive cough in am. Complains of R arm pain due to bursitis and he was treated with antibiotics in ED 03/30/11. Weight stable. Weight at home 158-161.  No edema, orthopnea or PND. Continues to smoke 4-5 cigs a day. Lives with his brother. Applied for disability   ROS: All systems negative except as listed in HPI, PMH and Problem List.  Past Medical History  Diagnosis Date  . Systolic heart failure     EF 20-25% by echo 12/28/10  . Nonischemic cardiomyopathy     minimal coronary disease by cath 12/29/10  . Hemoptysis     secondary to pulmonary edema  . Hyperlipidemia   . Chronic anemia   . GI bleed     15 years ago  . Tobacco abuse   . Alcohol abuse   . Osteoarthritis   . Diabetes mellitus, type 2     Current Outpatient Prescriptions  Medication Sig Dispense Refill  . Aspirin-Caffeine (BAYER BACK & BODY PAIN EX ST) 500-32.5 MG TABS Take 1 tablet by mouth daily as needed. For leg pain (when no percocet)      . carvedilol (COREG) 6.25 MG tablet Take 1 1/2 tabs in the am and pm  90 tablet  6  . digoxin (LANOXIN) 0.25 MG tablet Take 250 mcg by mouth daily.        . furosemide (LASIX) 40 MG tablet Take 1 tablet (40 mg total) by mouth 2 (two) times daily.      [name] ibuprofen (ADVIL,MOTRIN) 800 MG tablet Take 1 tablet (800 mg total) by mouth 3 (three) times  daily.  21 tablet  0  . lisinopril (PRINIVIL,ZESTRIL) 10 MG tablet Take 1 tablet (10 mg total) by mouth daily.      [name] oxyCODONE-acetaminophen (PERCOCET) 5-325 MG per tablet Take 1-2 tabs every 4 hours as needed for pain.  15 tablet  0  . spironolactone (ALDACTONE) 25 MG tablet Take 25 mg by mouth daily.           PHYSICAL EXAM: Filed Vitals:   04/02/11 1631  Pulse: 75  Wt: 158  (159)  General:  No acute distress HEENT: normal except for poor dentition. Neck: supple. JVP 8-9 Carotids 2+ bilaterally; no bruits. No lymphadenopathy or thryomegaly appreciated. Cor: PMI laterally displaced. Regular rate & rhythm. No rubs, murmurs. +s3 Lungs:minimal  LLL rhonchi/crackles Abdomen: soft, nontender, nondistended. No hepatosplenomegaly. No bruits or masses. Good bowel sounds. Extremities: no cyanosis, clubbing, rash, trace edema, left elbow painful with external rotation. No erythema or calor. Neuro: alert & orientedx3, cranial nerves grossly intact. Moves all 4 extremities w/o difficulty. Affect pleasant.     ASSESSMENT & PLAN:

## 2011-04-03 ENCOUNTER — Other Ambulatory Visit: Payer: Self-pay

## 2011-04-03 ENCOUNTER — Ambulatory Visit (INDEPENDENT_AMBULATORY_CARE_PROVIDER_SITE_OTHER): Payer: Self-pay | Admitting: *Deleted

## 2011-04-03 DIAGNOSIS — Z79899 Other long term (current) drug therapy: Secondary | ICD-10-CM

## 2011-04-03 DIAGNOSIS — R0602 Shortness of breath: Secondary | ICD-10-CM

## 2011-04-03 DIAGNOSIS — I5022 Chronic systolic (congestive) heart failure: Secondary | ICD-10-CM

## 2011-04-03 DIAGNOSIS — N289 Disorder of kidney and ureter, unspecified: Secondary | ICD-10-CM

## 2011-04-04 LAB — BASIC METABOLIC PANEL
BUN: 34 mg/dL — ABNORMAL HIGH (ref 6–24)
Calcium: 9.3 mg/dL (ref 8.7–10.2)
Chloride: 98 mmol/L (ref 97–108)
GFR calc Af Amer: 62 mL/min/{1.73_m2} (ref >59)
Glucose: 105 mg/dL — ABNORMAL HIGH (ref 65–99)
Potassium: 4.6 mmol/L (ref 3.5–5.2)

## 2011-04-04 LAB — BRAIN NATRIURETIC PEPTIDE: BNP: 15.5 pg/mL (ref 0.0–100.0)

## 2011-04-04 LAB — URIC ACID: Uric Acid: 12.4 mg/dL — ABNORMAL HIGH (ref 3.7–8.6)

## 2011-04-06 ENCOUNTER — Telehealth (HOSPITAL_COMMUNITY): Payer: Self-pay | Admitting: *Deleted

## 2011-04-06 NOTE — Telephone Encounter (Signed)
Travis [name] called this afternoon, he wants you to know that he got rid of the meds that he is not suppose to be taking and is now taking the correct medication.

## 2011-04-12 ENCOUNTER — Telehealth (HOSPITAL_COMMUNITY): Payer: Self-pay | Admitting: *Deleted

## 2011-04-12 NOTE — Telephone Encounter (Signed)
[name] called today, he has 4 days worth of medication left and needs refills.

## 2011-04-12 NOTE — Telephone Encounter (Signed)
Left mess to call back and let me know what pharmacy to send meds to

## 2011-04-17 MED ORDER — CARVEDILOL 6.25 MG PO TABS: 9.3750 mg | ORAL_TABLET | Freq: Two times a day (BID) | ORAL | Status: DC

## 2011-04-17 MED ORDER — LISINOPRIL 10 MG PO TABS: 10.0000 mg | ORAL_TABLET | Freq: Every day | ORAL | Status: DC

## 2011-04-17 MED ORDER — SPIRONOLACTONE 25 MG PO TABS: 25.0000 mg | ORAL_TABLET | Freq: Every day | ORAL | Status: DC

## 2011-04-17 MED ORDER — FUROSEMIDE 40 MG PO TABS: 40.0000 mg | ORAL_TABLET | Freq: Two times a day (BID) | ORAL | Status: DC

## 2011-04-17 NOTE — Telephone Encounter (Signed)
Pt wants meds sent 2 [facility] in [place]

## 2011-04-23 ENCOUNTER — Ambulatory Visit (HOSPITAL_COMMUNITY)
Admission: RE | Admit: 2011-04-23 | Discharge: 2011-04-23 | Disposition: A | Discharge status: Home / Self Care | Payer: Medicaid Other | Source: Ambulatory Visit | Attending: Internal Medicine | Admitting: Internal Medicine

## 2011-04-23 VITALS — BP 104/64 | HR 99 | Wt 162.5 lb

## 2011-04-23 DIAGNOSIS — I5022 Chronic systolic (congestive) heart failure: Secondary | ICD-10-CM

## 2011-04-23 MED ORDER — CARVEDILOL 6.25 MG PO TABS: 12.5000 mg | ORAL_TABLET | Freq: Two times a day (BID) | ORAL | Status: DC

## 2011-04-23 NOTE — Assessment & Plan Note (Addendum)
NYHA III. Volume status mildly elevated. Will titrate carvedilol as BP allows.  No digoxin secondary to increase dig level last visit.  Will repeat echo in 4 weeks at follow up.  Will hold off on CPX at the moment as his symptoms are stable although not improving.  With continued smoking and financial issues he is not a candidate for advanced therapies.    Patient seen and examined with Leone Haven PA-C. We discussed all aspects of the encounter. I agree with the assessment and plan as stated above.  He remains quite tenuous with low BP. Has mild volume overload. Reinforced need for daily weights and reviewed use of sliding scale diuretics. Will need repeat echo soon to assess need for ICD. Unfortunately, I agree that he is not ideal candidate for advanced therapies, if needed. Counseled on need for smoking cessation.

## 2011-04-23 NOTE — Progress Notes (Signed)
HPI:  Mr. Slagter is a 56 y/o male with a h/o ETOH and tobacco abuse as well as severe osteoarthritis. He was admitted to [facility] from 10/3-10/6 with acute systolic HF.   [id number] Echo showed EF 20-25% with global HK. Cath showed minimal non-obstructive CAD.   He returns for routine follow up today.  His BP was too low to titrate medications last visit. Digoxin stopped 04/05/11 due to dig level of 2.4.   He feels ok today.  Fatigue comes and goes.  He denies SOB/PND or orthopnea.  No dizziness or syncope.  He is only weighing once a week.  No edema.  Smoking 4-5 cigs/day. Lives with his brother. Applied for disability   ROS: All systems negative except as listed in HPI, PMH and Problem List.  Past Medical History  Diagnosis Date  . Systolic heart failure     EF 20-25% by echo 12/28/10  . Nonischemic cardiomyopathy     minimal coronary disease by cath 12/29/10  . Hemoptysis     secondary to pulmonary edema  . Hyperlipidemia   . Chronic anemia   . GI bleed     15 years ago  . Tobacco abuse   . Alcohol abuse   . Osteoarthritis   . Diabetes mellitus, type 2     Current Outpatient Prescriptions  Medication Sig Dispense Refill  . Aspirin-Caffeine (BAYER BACK & BODY PAIN EX ST) 500-32.5 MG TABS Take 1 tablet by mouth daily as needed. For leg pain (when no percocet)      . carvedilol (COREG) 6.25 MG tablet Take 1.5 tablets (9.375 mg total) by mouth 2 (two) times daily with a meal.  90 tablet  6  . furosemide (LASIX) 40 MG tablet Take 1 tablet (40 mg total) by mouth 2 (two) times daily.  60 tablet  6  . lisinopril (PRINIVIL,ZESTRIL) 10 MG tablet Take 1 tablet (10 mg total) by mouth daily.  30 tablet  6  . spironolactone (ALDACTONE) 25 MG tablet Take 1 tablet (25 mg total) by mouth daily.  30 tablet  6     PHYSICAL EXAM: Filed Vitals:   04/23/11 1613  BP: 104/64  Pulse: 99  Weight: 162 lb 8 oz (73.71 kg)  SpO2: 99%   General:  No acute distress HEENT: normal except for poor  dentition. Neck: supple. JVP 8-9 Carotids 2+ bilaterally; no bruits. No lymphadenopathy or thryomegaly appreciated. Cor: PMI laterally displaced. Regular rate & rhythm. No rubs, murmurs. +s3 Lungs:minimal  LLL rhonchi/crackles Abdomen: soft, nontender, nondistended. No hepatosplenomegaly. No bruits or masses. Good bowel sounds. Extremities: no cyanosis, clubbing, rash, trace edema, left elbow painful with external rotation. No erythema or calor. Neuro: alert & orientedx3, cranial nerves grossly intact. Moves all 4 extremities w/o difficulty. Affect pleasant.     ASSESSMENT & PLAN:

## 2011-04-23 NOTE — Patient Instructions (Signed)
Increase carvedilol 2 tabs (12.5 mg) twice daily.    Watch fluid intake.  Follow up 3-4 weeks with echo.  Your physician has requested that you have an echocardiogram. Echocardiography is a painless test that uses sound waves to create images of your heart. It provides your doctor with information about the size and shape of your heart and how well your heart's chambers and valves are working. This procedure takes approximately one hour. There are no restrictions for this procedure.  Do the following things EVERYDAY: 1) Weigh yourself in the morning before breakfast. Write it down and keep it in a log. 2) Take your medicines as prescribed 3) Eat low salt foods-Limit salt (sodium) to 2000mg  per day.  4) Stay as active as you can everyday

## 2011-05-11 ENCOUNTER — Other Ambulatory Visit: Payer: Self-pay | Admitting: *Deleted

## 2011-05-14 ENCOUNTER — Telehealth (HOSPITAL_COMMUNITY): Payer: Self-pay | Admitting: *Deleted

## 2011-05-14 NOTE — Telephone Encounter (Signed)
Refills for all meds were sent in in Jan, not sure what he needs, Left message to call back

## 2011-05-14 NOTE — Telephone Encounter (Signed)
[name] called he needs refills for his medication.  He would like you to call him. Thanks.

## 2011-05-14 NOTE — Telephone Encounter (Signed)
Patient is on last day of meds and needs Heather to call in prescription at [facility] in [place].  Patient will give Nira Conn a call back later today.

## 2011-05-15 ENCOUNTER — Other Ambulatory Visit (INDEPENDENT_AMBULATORY_CARE_PROVIDER_SITE_OTHER): Payer: Self-pay

## 2011-05-15 ENCOUNTER — Other Ambulatory Visit: Payer: Self-pay

## 2011-05-15 DIAGNOSIS — I059 Rheumatic mitral valve disease, unspecified: Secondary | ICD-10-CM

## 2011-05-15 NOTE — Telephone Encounter (Signed)
Travis Haven, PA spoke w/pt yesterday he is aware that he has refills on meds to contact pharmacy

## 2011-05-21 ENCOUNTER — Ambulatory Visit (HOSPITAL_COMMUNITY)
Admission: RE | Admit: 2011-05-21 | Discharge: 2011-05-21 | Disposition: A | Discharge status: Home / Self Care | Payer: Medicaid Other | Source: Ambulatory Visit | Attending: Internal Medicine | Admitting: Internal Medicine

## 2011-05-21 ENCOUNTER — Encounter (HOSPITAL_COMMUNITY): Payer: Self-pay

## 2011-05-21 VITALS — BP 108/66 | HR 96 | Wt 169.0 lb

## 2011-05-21 DIAGNOSIS — I5022 Chronic systolic (congestive) heart failure: Secondary | ICD-10-CM

## 2011-05-21 MED ORDER — FUROSEMIDE 40 MG PO TABS: 40.0000 mg | ORAL_TABLET | Freq: Every day | ORAL | Status: DC

## 2011-05-21 NOTE — Patient Instructions (Signed)
Cut lasix back to 40 mg daily.  Return in 6-8 weeks.

## 2011-05-21 NOTE — Progress Notes (Signed)
HPI:  [name] is a 56 y/o male with a h/o ETOH and tobacco abuse as well as severe osteoarthritis. He was admitted to [facility] from 10/3-10/6 with acute systolic HF.  Digoxin stopped 04/05/11 for dig level 2.4.  01/2011 Echo showed EF 20-25% with global HK. Cath showed minimal non-obstructive CAD.  05/15/11 echo:  EF 50-55%, basal inferior, posterior and septal wall hypokinesis  He returns for routine follow up today.  Reviewed echo results.  Feels good today.  Knees feel good today, arthritis comes and goes.  Walking some when knees feel good.  Fatigue comes and goes.  He denies SOB/PND or orthopnea.  No dizziness or syncope.  Weight is a steady 165 at home.  No edema.  Smoking 4-5 cigs/day. Lives with his brother. Applied for disability   ROS: All systems negative except as listed in HPI, PMH and Problem List.  Past Medical History  Diagnosis Date  . Systolic heart failure     EF 20-25% by echo 12/28/10  . Nonischemic cardiomyopathy     minimal coronary disease by cath 12/29/10  . Hemoptysis     secondary to pulmonary edema  . Hyperlipidemia   . Chronic anemia   . GI bleed     15 years ago  . Tobacco abuse   . Alcohol abuse   . Osteoarthritis   . Diabetes mellitus, type 2     Current Outpatient Prescriptions  Medication Sig Dispense Refill  . Aspirin-Caffeine (BAYER BACK & BODY PAIN EX ST) 500-32.5 MG TABS Take 1 tablet by mouth daily as needed. For leg pain (when no percocet)      . carvedilol (COREG) 6.25 MG tablet Take 9.375 mg by mouth 2 (two) times daily with a meal.      . furosemide (LASIX) 40 MG tablet Take 1 tablet (40 mg total) by mouth 2 (two) times daily.  60 tablet  6  . lisinopril (PRINIVIL,ZESTRIL) 10 MG tablet Take 1 tablet (10 mg total) by mouth daily.  30 tablet  6  . spironolactone (ALDACTONE) 25 MG tablet Take 1 tablet (25 mg total) by mouth daily.  30 tablet  6     PHYSICAL EXAM: Filed Vitals:   05/21/11 1551  BP: 108/66  Pulse: 96  Weight: 169 lb  (76.658 kg)  SpO2: 93%   General:  No acute distress HEENT: normal except for poor dentition. Neck: supple. JVP 8-9 Carotids 2+ bilaterally; no bruits. No lymphadenopathy or thryomegaly appreciated. Cor: PMI laterally displaced. Regular rate & rhythm. No rubs, murmurs.  Lungs:minimal  LLL rhonchi Abdomen: soft, nontender, nondistended. No hepatosplenomegaly. No bruits or masses. Good bowel sounds. Extremities: no cyanosis, clubbing, rash, trace edema, left elbow painful with external rotation. No erythema or calor. Neuro: alert & orientedx3, cranial nerves grossly intact. Moves all 4 extremities w/o difficulty. Affect pleasant.     ASSESSMENT & PLAN:

## 2011-05-21 NOTE — Assessment & Plan Note (Addendum)
Echo has been reviewed and his ejection fraction has recovered!  Will continue current regimen but decrease lasix to daily.  Discussed sliding scale lasix and he voices understanding.  Will follow up in 6-8 weeks.    Patient seen and examined with Leone Haven PA-C. We discussed all aspects of the encounter. I agree with the assessment and plan as stated above. Echo reviewed with him in clinic. EF now normal. Volume status looks good on exam. Agree with med changes as above.

## 2011-07-16 ENCOUNTER — Ambulatory Visit (HOSPITAL_COMMUNITY)
Admission: RE | Admit: 2011-07-16 | Discharge: 2011-07-16 | Disposition: A | Discharge status: Home / Self Care | Payer: Medicaid Other | Source: Ambulatory Visit | Attending: Internal Medicine | Admitting: Internal Medicine

## 2011-07-16 ENCOUNTER — Encounter (HOSPITAL_COMMUNITY): Payer: Self-pay

## 2011-07-16 VITALS — BP 90/60 | HR 98 | Wt 168.8 lb

## 2011-07-16 DIAGNOSIS — I5022 Chronic systolic (congestive) heart failure: Secondary | ICD-10-CM

## 2011-07-16 NOTE — Patient Instructions (Signed)
Follow up in 3 months  Do the following things EVERYDAY: 1) Weigh yourself in the morning before breakfast. Write it down and keep it in a log. 2) Take your medicines as prescribed 3) Eat low salt foods--Limit salt (sodium) to 2000mg  per day.  4) Stay as active as you can everyday

## 2011-07-16 NOTE — Assessment & Plan Note (Addendum)
NYHA II. Volume status remains stable.EF recovered.  Re-inforced daily weights and medication compliance. Follow up in 3 months.

## 2011-07-16 NOTE — Progress Notes (Signed)
Patient ID: Masin Mucha, male   DOB: Jan 24, 1956, 56 y.o.   MRN: [id number] HPI:  Mr. Lorig is a 56 y/o male with a h/o ETOH and tobacco abuse as well as severe osteoarthritis. He was admitted to [facility] from 10/3-10/6 with acute systolic HF.  Digoxin stopped 04/05/11 for dig level 2.4.  01/2011 Echo showed EF 20-25% with global HK. Cath showed minimal non-obstructive CAD.  05/15/11 echo:  EF 50-55%, basal inferior, posterior and septal wall hypokinesis  He returns for routine follow up today. Feels great.  Denies SOB/PND/Orthopnea/CP. For the most part he is taking all medications but he has 2 days. Complaint with medications.  Weigh 164-168. Disability approved. Continues to smoke 4-5 cigarettes per day.   ROS: All systems negative except as listed in HPI, PMH and Problem List.  Past Medical History  Diagnosis Date  . Systolic heart failure     EF 20-25% by echo 12/28/10  . Nonischemic cardiomyopathy     minimal coronary disease by cath 12/29/10  . Hemoptysis     secondary to pulmonary edema  . Hyperlipidemia   . Chronic anemia   . GI bleed     15 years ago  . Tobacco abuse   . Alcohol abuse   . Osteoarthritis   . Diabetes mellitus, type 2     Current Outpatient Prescriptions  Medication Sig Dispense Refill  . Aspirin-Caffeine (BAYER BACK & BODY PAIN EX ST) 500-32.5 MG TABS Take 1 tablet by mouth daily as needed. For leg pain (when no percocet)      . carvedilol (COREG) 6.25 MG tablet Take 9.375 mg by mouth 2 (two) times daily with a meal.      . furosemide (LASIX) 40 MG tablet Take 1 tablet (40 mg total) by mouth daily.  60 tablet  6  . lisinopril (PRINIVIL,ZESTRIL) 10 MG tablet Take 1 tablet (10 mg total) by mouth daily.  30 tablet  6  . spironolactone (ALDACTONE) 25 MG tablet Take 1 tablet (25 mg total) by mouth daily.  30 tablet  6     PHYSICAL EXAM: Filed Vitals:   07/16/11 1547  BP: 90/60  Pulse: 98  SpO2: 98%  168 (169) General:  No acute distress HEENT: normal  except for poor dentition. Neck: supple. JVP 6-7 Carotids 2+ bilaterally; no bruits. No lymphadenopathy or thryomegaly appreciated. Cor: PMI laterally displaced. Regular rate & rhythm. No rubs, murmurs.  Lungs:  CTA Abdomen: soft, nontender, nondistended. No hepatosplenomegaly. No bruits or masses. Good bowel sounds. Extremities: no cyanosis, clubbing, rash, trace edema, left elbow painful with external rotation. No erythema or calor. Neuro: alert & orientedx3, cranial nerves grossly intact. Moves all 4 extremities w/o difficulty. Affect pleasant.     ASSESSMENT & PLAN:

## 2011-07-19 ENCOUNTER — Telehealth (HOSPITAL_COMMUNITY): Payer: Self-pay | Admitting: *Deleted

## 2011-07-19 NOTE — Telephone Encounter (Signed)
[name] called. He wants you to call Arbie Cookey back.  He wants to know if it is ok for him to start going to the dentist. Thanks.

## 2011-07-20 NOTE — Telephone Encounter (Signed)
Left mess on Travis [name]'s VM ok for pt to go to dentist

## 2011-08-09 ENCOUNTER — Encounter (HOSPITAL_COMMUNITY): Payer: Self-pay | Admitting: Emergency Medicine

## 2011-08-09 ENCOUNTER — Emergency Department (HOSPITAL_COMMUNITY)
Admission: EM | Admit: 2011-08-09 | Discharge: 2011-08-09 | Disposition: A | Discharge status: Home / Self Care | Payer: Medicaid Other | Attending: Emergency Medicine | Admitting: Emergency Medicine

## 2011-08-09 DIAGNOSIS — F17200 Nicotine dependence, unspecified, uncomplicated: Secondary | ICD-10-CM | POA: Insufficient documentation

## 2011-08-09 DIAGNOSIS — I428 Other cardiomyopathies: Secondary | ICD-10-CM | POA: Insufficient documentation

## 2011-08-09 DIAGNOSIS — IMO0002 Reserved for concepts with insufficient information to code with codable children: Secondary | ICD-10-CM | POA: Insufficient documentation

## 2011-08-09 DIAGNOSIS — G8929 Other chronic pain: Secondary | ICD-10-CM

## 2011-08-09 DIAGNOSIS — M25469 Effusion, unspecified knee: Secondary | ICD-10-CM | POA: Insufficient documentation

## 2011-08-09 DIAGNOSIS — M1710 Unilateral primary osteoarthritis, unspecified knee: Secondary | ICD-10-CM | POA: Insufficient documentation

## 2011-08-09 DIAGNOSIS — E119 Type 2 diabetes mellitus without complications: Secondary | ICD-10-CM | POA: Insufficient documentation

## 2011-08-09 DIAGNOSIS — M25569 Pain in unspecified knee: Secondary | ICD-10-CM | POA: Insufficient documentation

## 2011-08-09 DIAGNOSIS — D649 Anemia, unspecified: Secondary | ICD-10-CM | POA: Insufficient documentation

## 2011-08-09 DIAGNOSIS — E785 Hyperlipidemia, unspecified: Secondary | ICD-10-CM | POA: Insufficient documentation

## 2011-08-09 MED ORDER — OXYCODONE-ACETAMINOPHEN 5-325 MG PO TABS
2.0000 | ORAL_TABLET | Freq: Once | ORAL | Status: AC
  Administered 2011-08-09: 2 via ORAL
  Filled 2011-08-09: qty 2

## 2011-08-09 MED ORDER — OXYCODONE-ACETAMINOPHEN 5-325 MG PO TABS: 1.0000 | ORAL_TABLET | Freq: Four times a day (QID) | ORAL | Status: AC | PRN

## 2011-08-09 NOTE — ED Provider Notes (Signed)
History     CSN: [id number]  Arrival date & time 08/09/11  40   First MD Initiated Contact with Patient 08/09/11 1612      No chief complaint on file.   (Consider location/radiation/quality/duration/timing/severity/associated sxs/prior treatment) HPI Comments: Patient with a history of OA in the right knee comes in today with a chief complaint of right knee pain.  He reports that he has had this pain for years, but pain worse over the past 3 days.  No acute injury or trauma.  He reports that the pain is worse in the morning and also at night.  Pain associated with some stiffness.  Pain located over the medial and the lateral joint line.  He also has some swelling present.  No erythema or warmth to the touch.  Denies fever or chills.  He reports that Percocet helps with the pain.  He sees [facility], but was not able to get an appointment scheduled for 4-5 weeks.  Patient is requesting pain medication and referral to Orthopedics.  The history is provided by the patient.    Past Medical History  Diagnosis Date  . Systolic heart failure     EF 20-25% by echo 12/28/10  . Nonischemic cardiomyopathy     minimal coronary disease by cath 12/29/10  . Hemoptysis     secondary to pulmonary edema  . Hyperlipidemia   . Chronic anemia   . GI bleed     15 years ago  . Tobacco abuse   . Alcohol abuse   . Osteoarthritis   . Diabetes mellitus, type 2     History reviewed. No pertinent past surgical history.  No family history on file.  History  Substance Use Topics  . Smoking status: Current Everyday Smoker    Types: Cigarettes  . Smokeless tobacco: Not on file  . Alcohol Use: No      Review of Systems  Constitutional: Negative for fever and chills.  Gastrointestinal: Negative for nausea and vomiting.  Musculoskeletal: Positive for joint swelling. Negative for back pain and gait problem.       Right knee pain  Skin: Negative for color change.    Allergies  Review of  patient's allergies indicates no known allergies.  Home Medications   Current Outpatient Rx  Name Route Sig Dispense Refill  . ASPIRIN-CAFFEINE 500-32.5 MG PO TABS Oral Take 1 tablet by mouth daily as needed. For leg pain (when no percocet)    . CARVEDILOL 6.25 MG PO TABS Oral Take 9.375 mg by mouth 2 (two) times daily with a meal.    . FUROSEMIDE 40 MG PO TABS Oral Take 1 tablet (40 mg total) by mouth daily. 60 tablet 6  . LISINOPRIL 10 MG PO TABS Oral Take 1 tablet (10 mg total) by mouth daily. 30 tablet 6  . SPIRONOLACTONE 25 MG PO TABS Oral Take 1 tablet (25 mg total) by mouth daily. 30 tablet 6    BP 90/68  Pulse 97  Temp(Src) 98.5 F (36.9 C) (Oral)  Resp 18  SpO2 97%  Physical Exam  Nursing note and vitals reviewed. Constitutional: He appears well-developed and well-nourished. No distress.  HENT:  Head: Normocephalic and atraumatic.  Neck: Normal range of motion. Neck supple.  Cardiovascular: Normal rate, regular rhythm, normal heart sounds and intact distal pulses.   Pulmonary/Chest: Effort normal and breath sounds normal.  Musculoskeletal:       Right knee: He exhibits swelling. He exhibits normal range of motion, no deformity and  no erythema. no tenderness found.  Neurological: He is alert. He has normal strength. No sensory deficit. Gait normal.  Skin: Skin is warm and dry. He is not diaphoretic. No erythema.  Psychiatric: He has a normal mood and affect.    ED Course  Procedures (including critical care time)  Labs Reviewed - No data to display No results found.   No diagnosis found.   Looked up patient in the Narcotic Database.  No recent narcotic prescriptions filled.   MDM  Patient with history of OA of right knee comes in today with right knee.  No acute injury or trauma.  No erythema or warmth.  Afebrile.  Full ROM of knee.  Therefore, septic joint is unlikely.  Feel that pain is most likely caused by his OA.  He reports that he was not able to get an  appt with [facility] for another 4-5 weeks.  Patient given short course of pain medication and referral to Orthopedics.          Sherlyn Lees Manly, PA-C 08/10/11 [place], [place] 09/17/11 [phone]

## 2011-08-09 NOTE — ED Notes (Signed)
Pt reports chronic knee pain (9/10) and swelling x 1 year and being seen by [facility] who has given pain meds and drained his knee. Pt reports he is out of pain meds and was late for his [facility] appointment today and had to reschedule. Today he is asking for something for pain and a referral to an orthopedic Dr.

## 2011-08-09 NOTE — Discharge Instructions (Signed)
Be sure to read and understand instructions below prior to leaving the hospital. If your symptoms persist without any improvement in 1 week it is reccommended that you follow up with orthopedics listed above. Use your pain medication as prescribed and do not operate heavy machinery while on pain medication. Note that your pain medication contains acetaminophen (Tylenol) & its is not reccommended that you use additional acetaminophen (Tylenol) while taking this medication.  Knee Effusion  The medical term for having fluid in your knee is effusion.This means something is wrong inside the knee. Some of the causes of fluid in the knee may be torn cartilage, a torn ligament, or bleeding into the joint from an injury. Small tears may heal on their own with conservative treatment. Conservative means rest, limited weight bearing activity and muscle strengthening exercises. Your recovery may take up to 6 weeks. Larger tears may require surgery.   TREATMENT  Rest, ice, elevation, and compression are the basic modes of treatment.   Apply ice to the sore area for 15 to 20 minutes, 3 to 4 times per day. Do this while you are awake for the first 2 days, or as directed. This can be stopped when the swelling goes away. Put the ice in a plastic bag and place a towel between the bag of ice and your skin.  Keep your leg elevated when possible to lessen swelling.  If your caregiver recommends crutches, use them as instructed for 1 week. Then, you may walk as tolerated.  Do not drive a vehicle on pain medication. ACTIVITY:            - Weight bearing as tolerated            - Exercises should be limited to pain free range of motion              SEEK MEDICAL CARE IF:  You have an increase in bruising, swelling, or pain.  Your toes feel cold.  Pain relief is not achieved with medications.  EMERGENCY:: Your toes are numb or blue or you have severe pain.  You notice redness, swelling, warmth or increasing pain in your  knee.  An unexplained oral temperature above 102 F (38.9 C) develops.  COLD THERAPY DIRECTIONS:  Ice or gel packs can be used to reduce both pain and swelling. Ice is the most helpful within the first 24 to 48 hours after an injury or flareup from overusing a muscle or joint.  Ice is effective, has very few side effects, and is safe for most people to use.   If you expose your skin to cold temperatures for too long or without the proper protection, you can damage your skin or nerves. Watch for signs of skin damage due to cold.   HOME CARE INSTRUCTIONS  Follow these tips to use ice and cold packs safely.  Place a dry or damp towel between the ice and skin. A damp towel will cool the skin more quickly, so you may need to shorten the time that the ice is used.  For a more rapid response, add gentle compression to the ice.  Ice for no more than 10 to 20 minutes at a time. The bonier the area you are icing, the less time it will take to get the benefits of ice.  Check your skin after 5 minutes to make sure there are no signs of a poor response to cold or skin damage.  Rest 20 minutes or more in between  uses.  Once your skin is numb, you can end your treatment. You can test numbness by very lightly touching your skin. The touch should be so light that you do not see the skin dimple from the pressure of your fingertip. When using ice, most people will feel these normal sensations in this order: cold, burning, aching, and numbness.  Do not use ice on someone who cannot communicate their responses to pain, such as small children or people with dementia.   HOW TO MAKE AN ICE PACK  To make an ice pack, do one of the following:  Place crushed ice or a bag of frozen vegetables in a sealable plastic bag. Squeeze out the excess air. Place this bag inside another plastic bag. Slide the bag into a pillowcase or place a damp towel between your skin and the bag.  Mix 3 parts water with 1 part rubbing alcohol.  Freeze the mixture in a sealable plastic bag. When you remove the mixture from the freezer, it will be slushy. Squeeze out the excess air. Place this bag inside another plastic bag. Slide the bag into a pillowcase or place a damp towel between your s

## 2011-09-18 ENCOUNTER — Emergency Department (HOSPITAL_COMMUNITY)
Admission: EM | Admit: 2011-09-18 | Discharge: 2011-09-18 | Disposition: A | Discharge status: Home / Self Care | Payer: Medicaid Other | Attending: Emergency Medicine | Admitting: Emergency Medicine

## 2011-09-18 ENCOUNTER — Encounter (HOSPITAL_COMMUNITY): Payer: Self-pay

## 2011-09-18 DIAGNOSIS — M109 Gout, unspecified: Secondary | ICD-10-CM | POA: Insufficient documentation

## 2011-09-18 DIAGNOSIS — M19049 Primary osteoarthritis, unspecified hand: Secondary | ICD-10-CM | POA: Insufficient documentation

## 2011-09-18 DIAGNOSIS — F17200 Nicotine dependence, unspecified, uncomplicated: Secondary | ICD-10-CM | POA: Insufficient documentation

## 2011-09-18 DIAGNOSIS — I428 Other cardiomyopathies: Secondary | ICD-10-CM | POA: Insufficient documentation

## 2011-09-18 DIAGNOSIS — E119 Type 2 diabetes mellitus without complications: Secondary | ICD-10-CM | POA: Insufficient documentation

## 2011-09-18 DIAGNOSIS — I5022 Chronic systolic (congestive) heart failure: Secondary | ICD-10-CM | POA: Insufficient documentation

## 2011-09-18 DIAGNOSIS — E785 Hyperlipidemia, unspecified: Secondary | ICD-10-CM | POA: Insufficient documentation

## 2011-09-18 HISTORY — DX: Gout, unspecified: M10.9

## 2011-09-18 MED ORDER — OXYCODONE-ACETAMINOPHEN 5-325 MG PO TABS
1.0000 | ORAL_TABLET | Freq: Once | ORAL | Status: AC
  Administered 2011-09-18: 1 via ORAL
  Filled 2011-09-18: qty 1

## 2011-09-18 MED ORDER — OXYCODONE-ACETAMINOPHEN 5-325 MG PO TABS: 1.0000 | ORAL_TABLET | ORAL | Status: AC | PRN

## 2011-09-18 MED ORDER — PREDNISONE 20 MG PO TABS
60.0000 mg | ORAL_TABLET | Freq: Once | ORAL | Status: AC
  Administered 2011-09-18: 60 mg via ORAL
  Filled 2011-09-18: qty 3

## 2011-09-18 MED ORDER — PREDNISONE 20 MG PO TABS: 40.0000 mg | ORAL_TABLET | Freq: Every day | ORAL | Status: DC

## 2011-09-18 NOTE — Discharge Instructions (Signed)
Keep your finger elevated. Ibuprofen for pain. Percocet for severe pain as prescribed. Take prednisone to help with swelling and inflammation starting tomorrow. Follow up with Dr. Lorin Mercy as referred. Return if fever, worsening swelling, or any new concerning symptoms.   Arthritis, Nonspecific Arthritis is inflammation of a joint. This usually means pain, redness, warmth or swelling are present. One or more joints may be involved. There are a number of types of arthritis. Your caregiver may not be able to tell what type of arthritis you have right away. CAUSES  The most common cause of arthritis is the wear and tear on the joint (osteoarthritis). This causes damage to the cartilage, which can break down over time. The knees, hips, back and neck are most often affected by this type of arthritis. Other types of arthritis and common causes of joint pain include:  Sprains and other injuries near the joint. Sometimes minor sprains and injuries cause pain and swelling that develop hours later.   Rheumatoid arthritis. This affects hands, feet and knees. It usually affects both sides of your body at the same time. It is often associated with chronic ailments, fever, weight loss and general weakness.   Crystal arthritis. Gout and pseudo gout can cause occasional acute severe pain, redness and swelling in the foot, ankle, or knee.   Infectious arthritis. Bacteria can get into a joint through a break in overlying skin. This can cause infection of the joint. Bacteria and viruses can also spread through the blood and affect your joints.   Drug, infectious and allergy reactions. Sometimes joints can become mildly painful and slightly swollen with these types of illnesses.  SYMPTOMS   Pain is the main symptom.   Your joint or joints can also be red, swollen and warm or hot to the touch.   You may have a fever with certain types of arthritis, or even feel overall ill.   The joint with arthritis will hurt with  movement. Stiffness is present with some types of arthritis.  DIAGNOSIS  Your caregiver will suspect arthritis based on your description of your symptoms and on your exam. Testing may be needed to find the type of arthritis:  Blood and sometimes urine tests.   X-ray tests and sometimes CT or MRI scans.   Removal of fluid from the joint (arthrocentesis) is done to check for bacteria, crystals or other causes. Your caregiver (or a specialist) will numb the area over the joint with a local anesthetic, and use a needle to remove joint fluid for examination. This procedure is only minimally uncomfortable.   Even with these tests, your caregiver may not be able to tell what kind of arthritis you have. Consultation with a specialist (rheumatologist) may be helpful.  TREATMENT  Your caregiver will discuss with you treatment specific to your type of arthritis. If the specific type cannot be determined, then the following general recommendations may apply. Treatment of severe joint pain includes:  Rest.   Elevation.   Anti-inflammatory medication (for example, ibuprofen) may be prescribed. Avoiding activities that cause increased pain.   Only take over-the-counter or prescription medicines for pain and discomfort as recommended by your caregiver.   Cold packs over an inflamed joint may be used for 10 to 15 minutes every hour. Hot packs sometimes feel better, but do not use overnight. Do not use hot packs if you are diabetic without your caregiver's permission.   A cortisone shot into arthritic joints may help reduce pain and swelling.  Any acute arthritis that gets worse over the next 1 to 2 days needs to be looked at to be sure there is no joint infection.  Long-term arthritis treatment involves modifying activities and lifestyle to reduce joint stress jarring. This can include weight loss. Also, exercise is needed to nourish the joint cartilage and remove waste. This helps keep the muscles  around the joint strong. HOME CARE INSTRUCTIONS   Do not take aspirin to relieve pain if gout is suspected. This elevates uric acid levels.   Only take over-the-counter or prescription medicines for pain, discomfort or fever as directed by your caregiver.   Rest the joint as much as possible.   If your joint is swollen, keep it elevated.   Use crutches if the painful joint is in your leg.   Drinking plenty of fluids may help for certain types of arthritis.   Follow your caregiver's dietary instructions.   Try low-impact exercise such as:   Swimming.   Water aerobics.   Biking.   Walking.   Morning stiffness is often relieved by a warm shower.   Put your joints through regular range-of-motion.  SEEK MEDICAL CARE IF:   You do not feel better in 24 hours or are getting worse.   You have side effects to medications, or are not getting better with treatment.  SEEK IMMEDIATE MEDICAL CARE IF:   You have a fever.   You develop severe joint pain, swelling or redness.   Many joints are involved and become painful and swollen.   There is severe back pain and/or leg weakness.   You have loss of bowel or bladder control.  Document Released: 04/19/2004 Document Revised: 03/01/2011 Document Reviewed: 05/05/2008 [facility] Patient Information 2012 [place].

## 2011-09-18 NOTE — ED Notes (Signed)
Pt presents with no acute distress.  Deneis injury- swelling to rt hand pointer finger joint.  HX of Gout- increased pain with movement- tender to touch

## 2011-09-18 NOTE — ED Provider Notes (Signed)
History     CSN: [id number]  Arrival date & time 09/18/11  [id number]   First MD Initiated Contact with Patient 09/18/11 1033      Chief Complaint  Patient presents with  . Hand Pain  . Joint Swelling    (Consider location/radiation/quality/duration/timing/severity/associated sxs/prior treatment) Patient is a 56 y.o. male presenting with hand pain. The history is provided by the patient.  Hand Pain This is a new problem. The current episode started in the past 7 days. The problem occurs constantly. The problem has been gradually worsening. Associated symptoms include arthralgias and joint swelling. Pertinent negatives include no anorexia, chills, fever, numbness or weakness. The symptoms are aggravated by bending. He has tried nothing for the symptoms.  Pt states he has had swelling and pain in his right 2nd finger at the MCP joint for several days. State  No injury. Hx of gout and arthritis. Pain with movement and palpation. No fever, chills, systemic symptoms, malaise.   Past Medical History  Diagnosis Date  . Systolic heart failure     EF 20-25% by echo 12/28/10  . Nonischemic cardiomyopathy     minimal coronary disease by cath 12/29/10  . Hemoptysis     secondary to pulmonary edema  . Hyperlipidemia   . Chronic anemia   . GI bleed     15 years ago  . Tobacco abuse   . Alcohol abuse   . Osteoarthritis   . Diabetes mellitus, type 2   . Gout     History reviewed. No pertinent past surgical history.  No family history on file.  History  Substance Use Topics  . Smoking status: Current Everyday Smoker    Types: Cigarettes  . Smokeless tobacco: Not on file  . Alcohol Use: No      Review of Systems  Constitutional: Negative for fever and chills.  Respiratory: Negative.   Cardiovascular: Negative.   Gastrointestinal: Negative for anorexia.  Musculoskeletal: Positive for joint swelling and arthralgias.  Skin: Positive for color change.  Neurological: Negative for  weakness and numbness.    Allergies  Review of patient's allergies indicates no known allergies.  Home Medications   Current Outpatient Rx  Name Route Sig Dispense Refill  . ASPIRIN-CAFFEINE 500-32.5 MG PO TABS Oral Take 1 tablet by mouth daily as needed. For leg pain (when no percocet)    . CARVEDILOL 6.25 MG PO TABS Oral Take 9.375 mg by mouth 2 (two) times daily with a meal.    . FUROSEMIDE 40 MG PO TABS Oral Take 1 tablet (40 mg total) by mouth daily. 60 tablet 6  . LISINOPRIL 10 MG PO TABS Oral Take 1 tablet (10 mg total) by mouth daily. 30 tablet 6  . OXYCODONE-ACETAMINOPHEN 5-325 MG PO TABS Oral Take 1-2 tablets by mouth every 6 (six) hours as needed. For pain    . SPIRONOLACTONE 25 MG PO TABS Oral Take 1 tablet (25 mg total) by mouth daily. 30 tablet 6    BP 147/92  Pulse 103  Temp 98.5 F (36.9 C) (Oral)  Resp 16  Ht 6\' 4"  (1.93 m)  Wt 169 lb (76.658 kg)  BMI 20.57 kg/m2  SpO2 99%  Physical Exam  Nursing note and vitals reviewed. Constitutional: He is oriented to person, place, and time. He appears well-developed and well-nourished. No distress.  HENT:  Head: Normocephalic.  Eyes: Conjunctivae are normal.  Cardiovascular: Normal rate, regular rhythm and normal heart sounds.   Pulmonary/Chest: Effort normal and breath sounds  normal. No respiratory distress. He has no wheezes. He has no rales.  Musculoskeletal:       Swelling to the right 2nd MCP joint. Tender to palpation. Joint is warm to the touch. No cellulitic changes over skin. Pain with ROM of the finger at this joint. Pt with multiple joint swelling deformity in hands, that appears arthritic, chronic, but not inflamed at present  Neurological: He is alert and oriented to person, place, and time.  Skin: Skin is warm and dry.  Psychiatric: He has a normal mood and affect.    ED Course  Procedures (including critical care time)  Labs Reviewed  GLUCOSE, CAPILLARY - Abnormal; Notable for the following:     Glucose-Capillary 104 (*)     All other components within normal limits   Right 2nd MCP joint swelling, arthritis vs gout. Doubt infection, no skin wounds, pt afebrile, no cellulitic changes. Will start on prednisone and pain medication and follow up with hand specialist.   1. Degenerative arthritis of finger       MDM         Renold Genta, PA 09/18/11 1611

## 2011-09-19 NOTE — ED Provider Notes (Signed)
History/physical exam/procedure(s) were performed by non-physician practitioner and as supervising physician I was immediately available for consultation/collaboration. I have reviewed all notes and am in agreement with care and plan.   Shaune Pollack, MD 09/19/11 1032

## 2011-09-22 NOTE — ED Provider Notes (Signed)
Medical screening examination/treatment/procedure(s) were performed by non-physician practitioner and as supervising physician I was immediately available for consultation/collaboration.   Mirna Mires, MD 09/22/11 1626

## 2011-10-18 ENCOUNTER — Encounter (HOSPITAL_COMMUNITY): Payer: Self-pay

## 2011-10-18 ENCOUNTER — Ambulatory Visit (HOSPITAL_COMMUNITY)
Admission: RE | Admit: 2011-10-18 | Discharge: 2011-10-18 | Disposition: A | Discharge status: Home / Self Care | Payer: Medicaid Other | Source: Ambulatory Visit | Attending: Internal Medicine | Admitting: Internal Medicine

## 2011-10-18 VITALS — BP 115/70 | HR 91 | Wt 159.0 lb

## 2011-10-18 DIAGNOSIS — F17200 Nicotine dependence, unspecified, uncomplicated: Secondary | ICD-10-CM | POA: Insufficient documentation

## 2011-10-18 DIAGNOSIS — I5022 Chronic systolic (congestive) heart failure: Secondary | ICD-10-CM

## 2011-10-18 MED ORDER — SPIRONOLACTONE 25 MG PO TABS: 25.0000 mg | ORAL_TABLET | Freq: Every day | ORAL | Status: DC

## 2011-10-18 MED ORDER — FUROSEMIDE 40 MG PO TABS: 40.0000 mg | ORAL_TABLET | Freq: Every day | ORAL | Status: DC

## 2011-10-18 MED ORDER — CARVEDILOL 6.25 MG PO TABS: 6.2500 mg | ORAL_TABLET | Freq: Two times a day (BID) | ORAL | Status: DC

## 2011-10-18 MED ORDER — LISINOPRIL 10 MG PO TABS: 10.0000 mg | ORAL_TABLET | Freq: Every day | ORAL | Status: DC

## 2011-10-18 NOTE — Assessment & Plan Note (Signed)
He declines smoking cessation.

## 2011-10-18 NOTE — Progress Notes (Signed)
Patient ID: Travis [name], male   DOB: [birth year], 56 y.o.   MRN: [id number] HPI:  Travis [name] is a 56 y/o male with a h/o ETOH and tobacco abuse as well as severe osteoarthritis. He was admitted to [facility] from 10/3-10/6 with acute systolic HF.  Digoxin stopped 04/05/11 for dig level 2.4.  01/2011 Echo showed EF 20-25% with global HK. Cath showed minimal non-obstructive CAD.  05/15/11 echo:  EF 50-55%, basal inferior, posterior and septal wall hypokinesis  He returns for routine follow up today. Feels great.  Denies SOB/PND/Orthopnea/CP. He skipped medications for 3 days. Otherwise he is compliant with medications.  Weighs every now then. Disability approved. Continues to smoke 5-6 cigarettes per day. Drinks 2 beers at least 2 times per day.   ROS: All systems negative except as listed in HPI, PMH and Problem List.  Past Medical History  Diagnosis Date  . Systolic heart failure     EF 20-25% by echo 12/28/10  . Nonischemic cardiomyopathy     minimal coronary disease by cath 12/29/10  . Hemoptysis     secondary to pulmonary edema  . Hyperlipidemia   . Chronic anemia   . GI bleed     15 years ago  . Tobacco abuse   . Alcohol abuse   . Osteoarthritis   . Diabetes mellitus, type 2   . Gout     Current Outpatient Prescriptions  Medication Sig Dispense Refill  . Aspirin-Caffeine (BAYER BACK & BODY PAIN EX ST) 500-32.5 MG TABS Take 1 tablet by mouth daily as needed. For leg pain (when no percocet)      . carvedilol (COREG) 6.25 MG tablet Take 9.375 mg by mouth 2 (two) times daily with a meal.      . furosemide (LASIX) 40 MG tablet Take 1 tablet (40 mg total) by mouth daily.  60 tablet  6  . lisinopril (PRINIVIL,ZESTRIL) 10 MG tablet Take 1 tablet (10 mg total) by mouth daily.  30 tablet  6  . oxyCODONE-acetaminophen (PERCOCET) 5-325 MG per tablet Take 1-2 tablets by mouth every 6 (six) hours as needed. For pain      . predniSONE (DELTASONE) 20 MG tablet Take 2 tablets (40 mg total) by mouth  daily.  8 tablet  0  . spironolactone (ALDACTONE) 25 MG tablet Take 1 tablet (25 mg total) by mouth daily.  30 tablet  6     PHYSICAL EXAM: Filed Vitals:   10/18/11 1520  BP: 115/70  Pulse: 91  Weight: 159 lb (72.122 kg)  SpO2: 99%   159 (168) General:  No acute distress HEENT: normal except for poor dentition. Neck: supple. JVP 6-7 Carotids 2+ bilaterally; no bruits. No lymphadenopathy or thryomegaly appreciated. Cor: PMI laterally displaced. Regular rate & rhythm. No rubs, murmurs.  Lungs:  CTA Abdomen: soft, nontender, nondistended. No hepatosplenomegaly. No bruits or masses. Good bowel sounds. Extremities: no cyanosis, clubbing, rash, trace edema, left elbow painful with external rotation. No erythema or calor. Neuro: alert & orientedx3, cranial nerves grossly intact. Moves all 4 extremities w/o difficulty. Affect pleasant.     ASSESSMENT & PLAN:

## 2011-10-18 NOTE — Assessment & Plan Note (Addendum)
Volume status stable. Continue with current regimen. Reinforced alcohol cessation, daily weights, and medication compliance. Follow up in 4 months   Patient seen and examined with Darrick Grinder, NP. We discussed all aspects of the encounter. I agree with the assessment and plan as stated above.  Doing much better. EF has recovered. Volume status looks good. Will continue current meds. Long discussion about need to refrain from ETOH consumption (He had a few drinks recently). I reiterated the high risk of relapse and recurrent HF.

## 2011-10-18 NOTE — Patient Instructions (Addendum)
   Follow up in 4 months  Do the following things EVERYDAY: 1) Weigh yourself in the morning before breakfast. Write it down and keep it in a log. 2) Take your medicines as prescribed 3) Eat low salt foods-Limit salt (sodium) to 2000 mg per day.  4) Stay as active as you can everyday 5) Limit all fluids for the day to less than 2 liters 

## 2011-11-27 ENCOUNTER — Encounter (HOSPITAL_COMMUNITY): Payer: Self-pay

## 2011-11-27 ENCOUNTER — Emergency Department (HOSPITAL_COMMUNITY)
Admission: EM | Admit: 2011-11-27 | Discharge: 2011-11-27 | Disposition: A | Discharge status: Home / Self Care | Payer: Medicaid Other | Attending: Emergency Medicine | Admitting: Emergency Medicine

## 2011-11-27 DIAGNOSIS — F17200 Nicotine dependence, unspecified, uncomplicated: Secondary | ICD-10-CM | POA: Insufficient documentation

## 2011-11-27 DIAGNOSIS — M1990 Unspecified osteoarthritis, unspecified site: Secondary | ICD-10-CM | POA: Insufficient documentation

## 2011-11-27 DIAGNOSIS — E119 Type 2 diabetes mellitus without complications: Secondary | ICD-10-CM | POA: Insufficient documentation

## 2011-11-27 DIAGNOSIS — M109 Gout, unspecified: Secondary | ICD-10-CM | POA: Insufficient documentation

## 2011-11-27 DIAGNOSIS — M19049 Primary osteoarthritis, unspecified hand: Secondary | ICD-10-CM | POA: Insufficient documentation

## 2011-11-27 DIAGNOSIS — E785 Hyperlipidemia, unspecified: Secondary | ICD-10-CM | POA: Insufficient documentation

## 2011-11-27 MED ORDER — PREDNISONE 20 MG PO TABS: 40.0000 mg | ORAL_TABLET | Freq: Every day | ORAL | Status: AC

## 2011-11-27 MED ORDER — OXYCODONE-ACETAMINOPHEN 5-325 MG PO TABS: 2.0000 | ORAL_TABLET | ORAL | Status: AC | PRN

## 2011-11-27 MED ORDER — OXYCODONE-ACETAMINOPHEN 5-325 MG PO TABS
2.0000 | ORAL_TABLET | Freq: Once | ORAL | Status: AC
  Administered 2011-11-27: 2 via ORAL
  Filled 2011-11-27: qty 2

## 2011-11-27 NOTE — ED Notes (Signed)
Pt complains of pain and swelling in his left hand, no injury noted, pt says that he often has pain in his knuckles on both hands

## 2011-11-27 NOTE — ED Notes (Signed)
Pt presents with no acute distress  C/o of swelling and pain to left hand. Denies injury.  Good CMS- very painful to move. HX of gout

## 2011-11-28 NOTE — ED Provider Notes (Signed)
History     CSN: [id number]  Arrival date & time 11/27/11  0856   None     Chief Complaint  Patient presents with  . Hand Pain  . Groin Swelling    (Consider location/radiation/quality/duration/timing/severity/associated sxs/prior treatment) HPI Comments: Patient presents with stiffness and swelling in his left hand. The pain is localized. He denies any injury to his left hand. He reports a gradual onset of stiffness and swelling over the past 2 days. The pain is described as dull and achy. He reports difficulty with flexion and extension of his left wrist and fingers. He has taken aspirin for the pain which does not provide relief. He denies numbness/tingling, warmth of joint.   Patient is a 56 y.o. male presenting with hand pain.  Hand Pain Associated symptoms include arthralgias and joint swelling. Pertinent negatives include no fatigue, fever, headaches, neck pain, numbness, rash or weakness.    Past Medical History  Diagnosis Date  . Systolic heart failure     EF 20-25% by echo 12/28/10  . Nonischemic cardiomyopathy     minimal coronary disease by cath 12/29/10  . Hemoptysis     secondary to pulmonary edema  . Hyperlipidemia   . Chronic anemia   . GI bleed     15 years ago  . Tobacco abuse   . Alcohol abuse   . Osteoarthritis   . Diabetes mellitus, type 2   . Gout     History reviewed. No pertinent past surgical history.  No family history on file.  History  Substance Use Topics  . Smoking status: Current Everyday Smoker    Types: Cigarettes  . Smokeless tobacco: Not on file  . Alcohol Use: No      Review of Systems  Constitutional: Negative for fever and fatigue.  HENT: Negative for neck pain and neck stiffness.   Musculoskeletal: Positive for joint swelling and arthralgias. Negative for back pain.  Skin: Negative for rash and wound.  Neurological: Negative for weakness, numbness and headaches.    Allergies  Review of patient's allergies indicates  no known allergies.  Home Medications   Current Outpatient Rx  Name Route Sig Dispense Refill  . ASPIRIN-CAFFEINE 500-32.5 MG PO TABS Oral Take 1 tablet by mouth daily as needed. For leg pain (when no percocet)    . CARVEDILOL 6.25 MG PO TABS Oral Take 1 tablet (6.25 mg total) by mouth 2 (two) times daily with a meal. 60 tablet 6  . FUROSEMIDE 40 MG PO TABS Oral Take 1 tablet (40 mg total) by mouth daily. 60 tablet 6  . LISINOPRIL 10 MG PO TABS Oral Take 10 mg by mouth at bedtime.    . SPIRONOLACTONE 25 MG PO TABS Oral Take 25 mg by mouth every morning.    . OXYCODONE-ACETAMINOPHEN 5-325 MG PO TABS Oral Take 1-2 tablets by mouth every 6 (six) hours as needed. For pain    . OXYCODONE-ACETAMINOPHEN 5-325 MG PO TABS Oral Take 2 tablets by mouth every 4 (four) hours as needed for pain. 15 tablet 0  . PREDNISONE 20 MG PO TABS Oral Take 2 tablets (40 mg total) by mouth daily. 10 tablet 0    BP 135/92  Pulse 100  Temp 97.7 F (36.5 C) (Oral)  Resp 18  SpO2 96%  Physical Exam  Nursing note and vitals reviewed. Constitutional: He is oriented to person, place, and time. He appears well-developed and well-nourished. No distress.  HENT:  Head: Normocephalic and atraumatic.  Eyes:  Conjunctivae are normal. No scleral icterus.  Neck: Normal range of motion. Neck supple.  Cardiovascular: Normal rate and regular rhythm.  Exam reveals no gallop and no friction rub.   No murmur heard. Pulmonary/Chest: Effort normal and breath sounds normal. No respiratory distress. He has no wheezes. He has no rales. He exhibits no tenderness.  Musculoskeletal:       Left wrist and MCP joints limited ROM with flexion and extension due to stiffness and pain. Generalized edema of left hand joints. Right wrist and MCP full ROM and without edema. No warmth of joint upon palpation.   Neurological: He is alert and oriented to person, place, and time.  Skin: Skin is warm and dry. He is not diaphoretic.  Psychiatric: He  has a normal mood and affect. His behavior is normal.    ED Course  Procedures (including critical care time)  Labs Reviewed - No data to display No results found.   1. Arthritis       MDM  6:16 AM Patient complains of left hand pain, stiffness and swelling. He has a history of arthritis and endorses the same pain as his arthritis. The joint was not warm to the touch or erythematous so my suspicion for gout is low. He denies injury so no imaging is necessary. I will treat him for arthritis flare. No further evaluation needed at this time.         Alvina Chou, [place] 11/28/11 [phone]

## 2011-11-30 NOTE — ED Provider Notes (Signed)
Medical screening examination/treatment/procedure(s) were performed by non-physician practitioner and as supervising physician I was immediately available for consultation/collaboration.  Travis [name]. Alvino Chapel, MD 11/30/11 [phone]

## 2012-01-24 ENCOUNTER — Emergency Department (HOSPITAL_COMMUNITY)
Admission: EM | Admit: 2012-01-24 | Discharge: 2012-01-24 | Disposition: A | Discharge status: Home / Self Care | Payer: Self-pay | Attending: Emergency Medicine | Admitting: Emergency Medicine

## 2012-01-24 ENCOUNTER — Encounter (HOSPITAL_COMMUNITY): Payer: Self-pay | Admitting: *Deleted

## 2012-01-24 ENCOUNTER — Emergency Department (HOSPITAL_COMMUNITY): Payer: Self-pay

## 2012-01-24 DIAGNOSIS — E119 Type 2 diabetes mellitus without complications: Secondary | ICD-10-CM | POA: Insufficient documentation

## 2012-01-24 DIAGNOSIS — M25529 Pain in unspecified elbow: Secondary | ICD-10-CM | POA: Insufficient documentation

## 2012-01-24 DIAGNOSIS — F17200 Nicotine dependence, unspecified, uncomplicated: Secondary | ICD-10-CM | POA: Insufficient documentation

## 2012-01-24 DIAGNOSIS — Z79899 Other long term (current) drug therapy: Secondary | ICD-10-CM | POA: Insufficient documentation

## 2012-01-24 DIAGNOSIS — M1990 Unspecified osteoarthritis, unspecified site: Secondary | ICD-10-CM | POA: Insufficient documentation

## 2012-01-24 DIAGNOSIS — I5020 Unspecified systolic (congestive) heart failure: Secondary | ICD-10-CM | POA: Insufficient documentation

## 2012-01-24 DIAGNOSIS — Z862 Personal history of diseases of the blood and blood-forming organs and certain disorders involving the immune mechanism: Secondary | ICD-10-CM | POA: Insufficient documentation

## 2012-01-24 DIAGNOSIS — Z7982 Long term (current) use of aspirin: Secondary | ICD-10-CM | POA: Insufficient documentation

## 2012-01-24 DIAGNOSIS — E785 Hyperlipidemia, unspecified: Secondary | ICD-10-CM | POA: Insufficient documentation

## 2012-01-24 DIAGNOSIS — Z8719 Personal history of other diseases of the digestive system: Secondary | ICD-10-CM | POA: Insufficient documentation

## 2012-01-24 DIAGNOSIS — I428 Other cardiomyopathies: Secondary | ICD-10-CM | POA: Insufficient documentation

## 2012-01-24 MED ORDER — OXYCODONE-ACETAMINOPHEN 5-325 MG PO TABS
2.0000 | ORAL_TABLET | Freq: Once | ORAL | Status: AC
  Administered 2012-01-24: 2 via ORAL
  Filled 2012-01-24: qty 2

## 2012-01-24 MED ORDER — OXYCODONE-ACETAMINOPHEN 5-325 MG PO TABS: 1.0000 | ORAL_TABLET | Freq: Four times a day (QID) | ORAL | Status: DC | PRN

## 2012-01-24 NOTE — ED Notes (Addendum)
Pt reports woke up this morning with right elbow pain 9/10 "constant throb". bil strong radial pulses. Able to wiggle fingers. Denies injury or trauma. Reports right knuckles are sore as well.  Hx of arthritis, pt thinks this is what is causing pain

## 2012-01-24 NOTE — ED Notes (Signed)
Pt alert and oriented x4. Respirations even and unlabored, bilateral symmetrical rise and fall of chest. Skin warm and dry. In no acute distress. Denies needs.   

## 2012-01-24 NOTE — ED Notes (Signed)
Patient transported to X-ray 

## 2012-01-24 NOTE — ED Provider Notes (Signed)
History     CSN: [id number]  Arrival date & time 01/24/12  1201   First MD Initiated Contact with Patient 01/24/12 1243      Chief Complaint  Patient presents with  . Elbow Pain    (Consider location/radiation/quality/duration/timing/severity/associated sxs/prior treatment) HPI Comments: Patient comes in today with a chief complaint of right elbow pain.  He reports that he began having the pain this morning.  PMH significant for Arthritis.  He reports that he gets similar pain around this time every year.  He states that he usually takes Percocet when the arthritis pain becomes worse.  He does not have any Percocet at this time.  He has full ROM of his elbow, but has pain with ROM.  He denies swelling or erythema.  Denies fever or chills.  No injury or trauma to the elbow.  No numbness or tingling.  The history is provided by the patient.    Past Medical History  Diagnosis Date  . Systolic heart failure     EF 20-25% by echo 12/28/10  . Nonischemic cardiomyopathy     minimal coronary disease by cath 12/29/10  . Hemoptysis     secondary to pulmonary edema  . Hyperlipidemia   . Chronic anemia   . GI bleed     15 years ago  . Tobacco abuse   . Alcohol abuse   . Osteoarthritis   . Diabetes mellitus, type 2   . Gout     History reviewed. No pertinent past surgical history.  History reviewed. No pertinent family history.  History  Substance Use Topics  . Smoking status: Current Every Day Smoker    Types: Cigarettes  . Smokeless tobacco: Not on file  . Alcohol Use: No      Review of Systems  Constitutional: Negative for fever and chills.  Musculoskeletal: Negative for joint swelling.       Right elbow pain  Skin: Negative for color change.  Neurological: Negative for numbness.    Allergies  Review of patient's allergies indicates no known allergies.  Home Medications   Current Outpatient Rx  Name Route Sig Dispense Refill  . ASPIRIN-CAFFEINE 500-32.5 MG PO  TABS Oral Take 1 tablet by mouth daily as needed. For leg pain (when no percocet)    . CARVEDILOL 6.25 MG PO TABS Oral Take 1 tablet (6.25 mg total) by mouth 2 (two) times daily with a meal. 60 tablet 6  . FUROSEMIDE 40 MG PO TABS Oral Take 1 tablet (40 mg total) by mouth daily. 60 tablet 6  . LISINOPRIL 10 MG PO TABS Oral Take 10 mg by mouth at bedtime.    . SPIRONOLACTONE 25 MG PO TABS Oral Take 25 mg by mouth every morning.    . OXYCODONE-ACETAMINOPHEN 5-325 MG PO TABS Oral Take 1-2 tablets by mouth every 6 (six) hours as needed. For pain      BP 145/88  Pulse 74  Temp 99.1 F (37.3 C) (Oral)  Resp 16  SpO2 95%  Physical Exam  Nursing note and vitals reviewed. Constitutional: He appears well-developed and well-nourished. No distress.  HENT:  Head: Normocephalic and atraumatic.  Cardiovascular: Normal rate, regular rhythm and normal heart sounds.   Pulses:      Radial pulses are 2+ on the right side, and 2+ on the left side.  Pulmonary/Chest: Effort normal and breath sounds normal.  Musculoskeletal: Normal range of motion.       Right elbow: He exhibits normal range of  motion, no effusion and no deformity. tenderness found.  Neurological: He is alert. No sensory deficit.  Skin: Skin is warm and dry. He is not diaphoretic.       No erythema or warmth of the right elbow.  Psychiatric: He has a normal mood and affect.    ED Course  Procedures (including critical care time)  Labs Reviewed - No data to display Dg Elbow Complete Right  01/24/2012  *RADIOLOGY REPORT*  Clinical Data: History of pain.  No history of injury.  Decreased range of motion.  RIGHT ELBOW - COMPLETE 3+ VIEW  Comparison: None.  Findings: The anterior fat pad is abnormal suggesting element of joint effusion.  Alignment is normal.  No fracture or dislocation or bony destruction is evident.  There is minimal beginning spurring of the coronoid process of the ulna.  IMPRESSION: Question small joint effusion.   Minimal beginning spurring of coronoid process of the ulna.   Original Report Authenticated By: Shanon Brow Call      No diagnosis found.    MDM  Patient presenting with right elbow pain.  Patient with full ROM of right elbow.  No erythema, warmth, or obvious effusion.  Xray showing beginning spurring of Coronoid Process of the Ulna.  Feel that patient can be discharged home with short course of pain medication and PCP follow up.        Sherlyn Lees Diamond, PA-C 01/24/12 1816

## 2012-01-25 NOTE — ED Provider Notes (Signed)
Medical screening examination/treatment/procedure(s) were performed by non-physician practitioner and as supervising physician I was immediately available for consultation/collaboration.  Richarda Blade, MD 01/25/12 [phone]

## 2012-02-18 ENCOUNTER — Ambulatory Visit (HOSPITAL_COMMUNITY)
Admission: RE | Admit: 2012-02-18 | Discharge: 2012-02-18 | Disposition: A | Discharge status: Home / Self Care | Payer: Medicaid Other | Source: Ambulatory Visit | Attending: Internal Medicine | Admitting: Internal Medicine

## 2012-02-18 ENCOUNTER — Encounter (HOSPITAL_COMMUNITY): Payer: Self-pay

## 2012-02-18 VITALS — BP 140/90 | HR 99 | Wt 159.0 lb

## 2012-02-18 DIAGNOSIS — I5022 Chronic systolic (congestive) heart failure: Secondary | ICD-10-CM

## 2012-02-18 DIAGNOSIS — F17200 Nicotine dependence, unspecified, uncomplicated: Secondary | ICD-10-CM | POA: Insufficient documentation

## 2012-02-18 MED ORDER — LISINOPRIL 2.5 MG PO TABS: 2.5000 mg | ORAL_TABLET | Freq: Every day | ORAL | Status: DC

## 2012-02-18 MED ORDER — LISINOPRIL 10 MG PO TABS: 10.0000 mg | ORAL_TABLET | Freq: Every day | ORAL | Status: DC

## 2012-02-18 NOTE — Patient Instructions (Addendum)
Take Lisinopril 10 mg at night.   Please get BMET next Monday 02/25/12  Follow up in 3 weeks   Do the following things EVERYDAY: 1) Weigh yourself in the morning before breakfast. Write it down and keep it in a log. 2) Take your medicines as prescribed 3) Eat low salt foods-Limit salt (sodium) to 2000 mg per day.  4) Stay as active as you can everyday 5) Limit all fluids for the day to less than 2 liters

## 2012-02-18 NOTE — Assessment & Plan Note (Addendum)
NYHA I. Volume status stable. He stopped taking spironolactone and lisinopril. Restarted Lisinopril 10  mg at night as he continues to drink alcohol and his blood pressure is elevated.   Will not restart spironolactone at this time. Check BMET next week. Follow up in 3 months.

## 2012-02-18 NOTE — Assessment & Plan Note (Signed)
He declines smoking cessation.

## 2012-02-18 NOTE — Progress Notes (Signed)
Patient ID: Travis [name], male   DOB: 1955-05-27, 56 y.o.   MRN: [id number] HPI:  Travis [name] is a 56 y/o male with a h/o ETOH and tobacco abuse as well as severe osteoarthritis. He was admitted to [facility] from 10/3-10/6 with acute systolic HF.  Digoxin stopped 04/05/11 for dig level 2.4.  01/2011 Echo showed EF 20-25% with global HK. Cath showed minimal non-obstructive CAD.  05/15/11 echo:  EF 50-55%, basal inferior, posterior and septal wall hypokinesis  He returns for routine follow up today. Denies SOB/PND/Orthopnea/CP. He is only taking lasix 40 mg twice a day and carvedilol 6.25 mg twice a day. Drinks 2-3 beers over the last few months. He is not weighing.   ROS: All systems negative except as listed in HPI, PMH and Problem List.  Past Medical History  Diagnosis Date  . Systolic heart failure     EF 20-25% by echo 12/28/10  . Nonischemic cardiomyopathy     minimal coronary disease by cath 12/29/10  . Hemoptysis     secondary to pulmonary edema  . Hyperlipidemia   . Chronic anemia   . GI bleed     15 years ago  . Tobacco abuse   . Alcohol abuse   . Osteoarthritis   . Diabetes mellitus, type 2   . Gout     Current Outpatient Prescriptions  Medication Sig Dispense Refill  . Aspirin-Caffeine (BAYER BACK & BODY PAIN EX ST) 500-32.5 MG TABS Take 1 tablet by mouth daily as needed. For leg pain (when no percocet)      . carvedilol (COREG) 6.25 MG tablet Take 1 tablet (6.25 mg total) by mouth 2 (two) times daily with a meal.  60 tablet  6  . furosemide (LASIX) 40 MG tablet Take 1 tablet (40 mg total) by mouth daily.  60 tablet  6  . lisinopril (PRINIVIL,ZESTRIL) 10 MG tablet Take 10 mg by mouth at bedtime.      [name] oxyCODONE-acetaminophen (PERCOCET) 5-325 MG per tablet Take 1-2 tablets by mouth every 6 (six) hours as needed. For pain      . oxyCODONE-acetaminophen (PERCOCET/ROXICET) 5-325 MG per tablet Take 1-2 tablets by mouth every 6 (six) hours as needed for pain.  20 tablet  0  .  spironolactone (ALDACTONE) 25 MG tablet Take 25 mg by mouth every morning.         PHYSICAL EXAM: Filed Vitals:   02/18/12 1318  BP: 140/90  Pulse: 99  Weight: 159 lb (72.122 kg)  SpO2: 100%   159 (159) General:  No acute distress HEENT: normal except for poor dentition. Neck: supple. JVP 6-7 Carotids 2+ bilaterally; no bruits. No lymphadenopathy or thryomegaly appreciated. Cor: PMI laterally displaced. Regular rate & rhythm. No rubs, murmurs.  Lungs:  CTA Abdomen: soft, nontender, nondistended. No hepatosplenomegaly. No bruits or masses. Good bowel sounds. Extremities: no cyanosis, clubbing, rash, trace edema, left elbow painful with external rotation. No erythema or calor. Neuro: alert & orientedx3, cranial nerves grossly intact. Moves all 4 extremities w/o difficulty. Affect pleasant.     ASSESSMENT & PLAN:

## 2012-02-25 ENCOUNTER — Emergency Department (HOSPITAL_COMMUNITY)
Admission: EM | Admit: 2012-02-25 | Discharge: 2012-02-25 | Disposition: A | Discharge status: Home / Self Care | Payer: Self-pay | Attending: Emergency Medicine | Admitting: Emergency Medicine

## 2012-02-25 ENCOUNTER — Ambulatory Visit (INDEPENDENT_AMBULATORY_CARE_PROVIDER_SITE_OTHER): Payer: Self-pay

## 2012-02-25 ENCOUNTER — Encounter (HOSPITAL_COMMUNITY): Payer: Self-pay | Admitting: Emergency Medicine

## 2012-02-25 DIAGNOSIS — F17200 Nicotine dependence, unspecified, uncomplicated: Secondary | ICD-10-CM | POA: Insufficient documentation

## 2012-02-25 DIAGNOSIS — R062 Wheezing: Secondary | ICD-10-CM | POA: Insufficient documentation

## 2012-02-25 DIAGNOSIS — H00012 Hordeolum externum right lower eyelid: Secondary | ICD-10-CM

## 2012-02-25 DIAGNOSIS — Z8719 Personal history of other diseases of the digestive system: Secondary | ICD-10-CM | POA: Insufficient documentation

## 2012-02-25 DIAGNOSIS — Z79899 Other long term (current) drug therapy: Secondary | ICD-10-CM | POA: Insufficient documentation

## 2012-02-25 DIAGNOSIS — IMO0002 Reserved for concepts with insufficient information to code with codable children: Secondary | ICD-10-CM | POA: Insufficient documentation

## 2012-02-25 DIAGNOSIS — I5020 Unspecified systolic (congestive) heart failure: Secondary | ICD-10-CM | POA: Insufficient documentation

## 2012-02-25 DIAGNOSIS — I5022 Chronic systolic (congestive) heart failure: Secondary | ICD-10-CM

## 2012-02-25 DIAGNOSIS — Z8679 Personal history of other diseases of the circulatory system: Secondary | ICD-10-CM | POA: Insufficient documentation

## 2012-02-25 DIAGNOSIS — Z8739 Personal history of other diseases of the musculoskeletal system and connective tissue: Secondary | ICD-10-CM | POA: Insufficient documentation

## 2012-02-25 DIAGNOSIS — H00019 Hordeolum externum unspecified eye, unspecified eyelid: Secondary | ICD-10-CM | POA: Insufficient documentation

## 2012-02-25 DIAGNOSIS — E785 Hyperlipidemia, unspecified: Secondary | ICD-10-CM | POA: Insufficient documentation

## 2012-02-25 DIAGNOSIS — Z8709 Personal history of other diseases of the respiratory system: Secondary | ICD-10-CM | POA: Insufficient documentation

## 2012-02-25 DIAGNOSIS — D649 Anemia, unspecified: Secondary | ICD-10-CM | POA: Insufficient documentation

## 2012-02-25 DIAGNOSIS — M1710 Unilateral primary osteoarthritis, unspecified knee: Secondary | ICD-10-CM

## 2012-02-25 DIAGNOSIS — E119 Type 2 diabetes mellitus without complications: Secondary | ICD-10-CM | POA: Insufficient documentation

## 2012-02-25 MED ORDER — OXYCODONE-ACETAMINOPHEN 5-325 MG PO TABS: 1.0000 | ORAL_TABLET | ORAL | Status: DC | PRN

## 2012-02-25 MED ORDER — MELOXICAM 15 MG PO TABS: 15.0000 mg | ORAL_TABLET | Freq: Every day | ORAL | Status: DC

## 2012-02-25 MED ORDER — ALBUTEROL SULFATE HFA 108 (90 BASE) MCG/ACT IN AERS
2.0000 | INHALATION_SPRAY | Freq: Once | RESPIRATORY_TRACT | Status: AC
  Administered 2012-02-25: 2 via RESPIRATORY_TRACT
  Filled 2012-02-25: qty 6.7

## 2012-02-25 NOTE — ED Notes (Signed)
Discharge instructions given, verbalized understanding of follow up care.

## 2012-02-25 NOTE — ED Notes (Signed)
Pt presenting to ed with c/o possible sty to right eye x 2 days with mild amount of drainage.

## 2012-02-25 NOTE — ED Provider Notes (Signed)
History     CSN: [id number]  Arrival date & time 02/25/12  0818   First MD Initiated Contact with Patient 02/25/12 [phone]      Chief Complaint  Patient presents with  . right eye pain    (Consider location/radiation/quality/duration/timing/severity/associated sxs/prior treatment) HPI Comments: Travis [name] 56 y.o. male   The chief complaint is: Patient presents with:   right eye pain    57 year old male with a complicated past medical history presents today with chief complaint of arthritis pain and right eye pain.  Patient has an apparent corneal lumbar for right eye.  He states this began approximately 2 days ago and has increased in pain and tenderness.  He denies ever having one before and was worried about the pain.  Patient also has chronic pain and hx of OA. States that he is out of pain meds and has no PCP. He has had frequent visits for pain.  He denies any leg swelling or chest pain.   Denies fevers, chills, myalgias, arthralgias. Denies DOE, SOB, chest tightness or pressure, radiation to left arm, jaw or back, or diaphoresis. Denies dysuria, flank pain, suprapubic pain, frequency, urgency, or hematuria. Denies headaches, light headedness, weakness, visual disturbances. Denies abdominal pain, nausea, vomiting, diarrhea or constipation.     The history is provided by the patient and medical records. No language interpreter was used.    Past Medical History  Diagnosis Date  . Systolic heart failure     EF 20-25% by echo 12/28/10  . Nonischemic cardiomyopathy     minimal coronary disease by cath 12/29/10  . Hemoptysis     secondary to pulmonary edema  . Hyperlipidemia   . Chronic anemia   . GI bleed     15 years ago  . Tobacco abuse   . Alcohol abuse   . Osteoarthritis   . Diabetes mellitus, type 2   . Gout     History reviewed. No pertinent past surgical history.  No family history on file.  History  Substance Use Topics  . Smoking status: Current Every  Day Smoker -- 4.0 packs/day    Types: Cigarettes  . Smokeless tobacco: Not on file  . Alcohol Use: No     Comment: every other weekend      Review of Systems  Constitutional: Negative.   HENT: Negative.   Eyes: Positive for pain. Negative for photophobia.  Cardiovascular: Negative.   Gastrointestinal: Negative.   Genitourinary: Negative.   Musculoskeletal: Positive for arthralgias.  Neurological: Negative.   Psychiatric/Behavioral: Negative.   All other systems reviewed and are negative.    Allergies  Review of patient's allergies indicates no known allergies.  Home Medications   Current Outpatient Rx  Name  Route  Sig  Dispense  Refill  . ASPIRIN 81 MG PO CHEW   Oral   Chew 81 mg by mouth daily.         [name] CARVEDILOL 6.25 MG PO TABS   Oral   Take 1 tablet (6.25 mg total) by mouth 2 (two) times daily with a meal.   60 tablet   6   . FUROSEMIDE 40 MG PO TABS   Oral   Take 1 tablet (40 mg total) by mouth daily.   60 tablet   6   . LISINOPRIL 10 MG PO TABS   Oral   Take 1 tablet (10 mg total) by mouth at bedtime.   30 tablet   3   . OXYCODONE-ACETAMINOPHEN 5-325 MG  PO TABS   Oral   Take 1 tablet by mouth daily as needed. For arthritis pain in leg         . SPIRONOLACTONE 25 MG PO TABS   Oral   Take 25 mg by mouth every morning.           BP 142/92  Pulse 94  Temp 98.3 F (36.8 C) (Oral)  Resp 18  SpO2 98%  Physical Exam  Nursing note and vitals reviewed. Constitutional: He appears well-developed and well-nourished. No distress.       Chronically ill appearing.  HENT:  Head: Normocephalic and atraumatic.  Eyes: Conjunctivae normal and EOM are normal. Pupils are equal, round, and reactive to light. Right eye exhibits hordeolum. Right eye exhibits no chemosis, no discharge and no exudate. No foreign body present in the right eye. Left eye exhibits no chemosis, no discharge, no exudate and no hordeolum. No foreign body present in the left eye.  No scleral icterus.    Neck: Normal range of motion. Neck supple.  Cardiovascular: Normal rate, regular rhythm and normal heart sounds.   Pulmonary/Chest: Effort normal and breath sounds normal. No respiratory distress.       Mild diffuse expiratory wheezes  Abdominal: Soft. There is no tenderness.  Musculoskeletal: He exhibits no edema.       TTP BL knees along joint line.  No swelling or effusion. No MOI.  Neurological: He is alert.  Skin: Skin is warm and dry. He is not diaphoretic.  Psychiatric: His behavior is normal.    ED Course  Procedures (including critical care time)  Labs Reviewed - No data to display No results found.   1. Hordeolum externum of right lower eyelid   2. OA (osteoarthritis) of knee   3. Wheezing on auscultation       MDM    A/P 1) Hordeolum- apply warm compresses 2) OA- d/c with percocet/ mobic 3) wheezing- albuterol 4) patient needs to find PCP.        Margarita Mail, PA-C 02/26/12 1703

## 2012-02-26 LAB — BASIC METABOLIC PANEL
Chloride: 104 mmol/L (ref 97–108)
GFR calc Af Amer: 96 mL/min/{1.73_m2} (ref >59)
GFR calc non Af Amer: 83 mL/min/{1.73_m2} (ref >59)
Glucose: 87 mg/dL (ref 65–99)
Potassium: 4.2 mmol/L (ref 3.5–5.2)
Sodium: 145 mmol/L — ABNORMAL HIGH (ref 134–144)

## 2012-02-29 NOTE — ED Provider Notes (Signed)
Pt of Dr Kathrynn Humble and PA Hebert Soho, MD 02/29/12 [phone]

## 2012-04-23 ENCOUNTER — Other Ambulatory Visit (HOSPITAL_COMMUNITY): Payer: Self-pay | Admitting: *Deleted

## 2012-04-23 MED ORDER — FUROSEMIDE 40 MG PO TABS: 40.0000 mg | ORAL_TABLET | Freq: Every day | ORAL | Status: DC

## 2012-04-23 MED ORDER — CARVEDILOL 6.25 MG PO TABS: 6.2500 mg | ORAL_TABLET | Freq: Two times a day (BID) | ORAL | Status: DC

## 2012-04-23 MED ORDER — LISINOPRIL 10 MG PO TABS: 10.0000 mg | ORAL_TABLET | Freq: Every day | ORAL | Status: DC

## 2012-04-23 MED ORDER — SPIRONOLACTONE 25 MG PO TABS: 25.0000 mg | ORAL_TABLET | Freq: Every morning | ORAL | Status: DC

## 2012-05-12 ENCOUNTER — Ambulatory Visit (HOSPITAL_COMMUNITY): Payer: Self-pay | Attending: Internal Medicine

## 2012-05-29 ENCOUNTER — Emergency Department (HOSPITAL_COMMUNITY): Payer: Self-pay

## 2012-05-29 ENCOUNTER — Encounter (HOSPITAL_COMMUNITY): Payer: Self-pay | Admitting: *Deleted

## 2012-05-29 ENCOUNTER — Emergency Department (HOSPITAL_COMMUNITY)
Admission: EM | Admit: 2012-05-29 | Discharge: 2012-05-30 | Disposition: A | Discharge status: Home / Self Care | Payer: Self-pay | Attending: Emergency Medicine | Admitting: Emergency Medicine

## 2012-05-29 DIAGNOSIS — F17200 Nicotine dependence, unspecified, uncomplicated: Secondary | ICD-10-CM | POA: Insufficient documentation

## 2012-05-29 DIAGNOSIS — F1010 Alcohol abuse, uncomplicated: Secondary | ICD-10-CM | POA: Insufficient documentation

## 2012-05-29 DIAGNOSIS — Y929 Unspecified place or not applicable: Secondary | ICD-10-CM | POA: Insufficient documentation

## 2012-05-29 DIAGNOSIS — Z8639 Personal history of other endocrine, nutritional and metabolic disease: Secondary | ICD-10-CM | POA: Insufficient documentation

## 2012-05-29 DIAGNOSIS — IMO0002 Reserved for concepts with insufficient information to code with codable children: Secondary | ICD-10-CM | POA: Insufficient documentation

## 2012-05-29 DIAGNOSIS — E119 Type 2 diabetes mellitus without complications: Secondary | ICD-10-CM | POA: Insufficient documentation

## 2012-05-29 DIAGNOSIS — R509 Fever, unspecified: Secondary | ICD-10-CM | POA: Insufficient documentation

## 2012-05-29 DIAGNOSIS — Z8719 Personal history of other diseases of the digestive system: Secondary | ICD-10-CM | POA: Insufficient documentation

## 2012-05-29 DIAGNOSIS — Z862 Personal history of diseases of the blood and blood-forming organs and certain disorders involving the immune mechanism: Secondary | ICD-10-CM | POA: Insufficient documentation

## 2012-05-29 DIAGNOSIS — Z7982 Long term (current) use of aspirin: Secondary | ICD-10-CM | POA: Insufficient documentation

## 2012-05-29 DIAGNOSIS — J069 Acute upper respiratory infection, unspecified: Secondary | ICD-10-CM | POA: Insufficient documentation

## 2012-05-29 DIAGNOSIS — Y9329 Activity, other involving ice and snow: Secondary | ICD-10-CM | POA: Insufficient documentation

## 2012-05-29 DIAGNOSIS — M1710 Unilateral primary osteoarthritis, unspecified knee: Secondary | ICD-10-CM

## 2012-05-29 DIAGNOSIS — D649 Anemia, unspecified: Secondary | ICD-10-CM | POA: Insufficient documentation

## 2012-05-29 DIAGNOSIS — E785 Hyperlipidemia, unspecified: Secondary | ICD-10-CM | POA: Insufficient documentation

## 2012-05-29 DIAGNOSIS — Z79899 Other long term (current) drug therapy: Secondary | ICD-10-CM | POA: Insufficient documentation

## 2012-05-29 DIAGNOSIS — Z8679 Personal history of other diseases of the circulatory system: Secondary | ICD-10-CM | POA: Insufficient documentation

## 2012-05-29 DIAGNOSIS — W010XXA Fall on same level from slipping, tripping and stumbling without subsequent striking against object, initial encounter: Secondary | ICD-10-CM | POA: Insufficient documentation

## 2012-05-29 DIAGNOSIS — R000 Tachycardia, unspecified: Secondary | ICD-10-CM | POA: Insufficient documentation

## 2012-05-29 LAB — CBC
MCH: 35.6 pg — ABNORMAL HIGH (ref 26.0–34.0)
MCHC: 34.2 g/dL (ref 30.0–36.0)
MCV: 104.3 fL — ABNORMAL HIGH (ref 78.0–100.0)
Platelets: 143 10*3/uL — ABNORMAL LOW (ref 150–400)
RDW: 15.6 % — ABNORMAL HIGH (ref 11.5–15.5)

## 2012-05-29 LAB — POCT I-STAT TROPONIN I: Troponin i, poc: 0 ng/mL (ref 0.00–0.08)

## 2012-05-29 LAB — RAPID URINE DRUG SCREEN, HOSP PERFORMED
Barbiturates: NOT DETECTED
Benzodiazepines: NOT DETECTED

## 2012-05-29 LAB — URINALYSIS, ROUTINE W REFLEX MICROSCOPIC
Nitrite: NEGATIVE
Protein, ur: NEGATIVE mg/dL
Urobilinogen, UA: 1 mg/dL (ref 0.0–1.0)
pH: 5 (ref 5.0–8.0)

## 2012-05-29 LAB — BASIC METABOLIC PANEL
BUN: 9 mg/dL (ref 6–23)
CO2: 25 mEq/L (ref 19–32)
Calcium: 9.2 mg/dL (ref 8.4–10.5)
Creatinine, Ser: 0.85 mg/dL (ref 0.50–1.35)
GFR calc non Af Amer: >90 mL/min (ref >90)
Glucose, Bld: 131 mg/dL — ABNORMAL HIGH (ref 70–99)

## 2012-05-29 LAB — URINE MICROSCOPIC-ADD ON

## 2012-05-29 MED ORDER — PSEUDOEPHEDRINE HCL ER 120 MG PO TB12: 120.0000 mg | ORAL_TABLET | Freq: Two times a day (BID) | ORAL | Status: DC

## 2012-05-29 MED ORDER — GUAIFENESIN ER 1200 MG PO TB12: 1.0000 | ORAL_TABLET | Freq: Two times a day (BID) | ORAL | Status: DC

## 2012-05-29 MED ORDER — OXYCODONE-ACETAMINOPHEN 5-325 MG PO TABS
1.0000 | ORAL_TABLET | Freq: Once | ORAL | Status: AC
  Administered 2012-05-29: 1 via ORAL
  Filled 2012-05-29: qty 1

## 2012-05-29 MED ORDER — HYDROCODONE-ACETAMINOPHEN 5-325 MG PO TABS: 1.0000 | ORAL_TABLET | Freq: Four times a day (QID) | ORAL | Status: DC | PRN

## 2012-05-29 MED ORDER — OXYMETAZOLINE HCL 0.05 % NA SOLN
1.0000 | Freq: Once | NASAL | Status: AC
  Administered 2012-05-29: 1 via NASAL
  Filled 2012-05-29: qty 15

## 2012-05-29 NOTE — ED Notes (Signed)
Pt reports falling approx 3 days ago on ice. C/o R knee pain with swelling. Also c/o sinus pain and pressure x3 days.

## 2012-05-29 NOTE — Progress Notes (Signed)
Pt reports seeing Dr Haroldine Laws CV as pcp

## 2012-05-29 NOTE — ED Provider Notes (Signed)
History    This chart was scribed for non-physician practitioner working with [name], MD by Hampton Abbot, ED Scribe. This patient was seen in room WTR5/WTR5 and the patient's care was started at 3:28 PM.    CSN: [id number]  Arrival date & time 05/29/12  1355   First MD Initiated Contact with Patient 05/29/12 1443      Chief Complaint  Patient presents with  . Knee Pain  . Nasal Congestion     The history is provided by the patient. No language interpreter was used.  Travis [name] is a 57 y.o. male with h/o systolic heart failure, DM-II and osteoarthritis who presents to the Emergency Department complaining of constant right knee pain that is chronic but was worsened 2 days ago when he slipped on ice and fell while ambulating landing directly on the right knee.  No head trauma or LOC.  Pt reports swelling immediately after the fall that has since reduced but reports that the pain is gradually worsening since the fall.  Pain is worsened by ROM, which he states is limited.  Pt has been elevating the knee without improvements.  No ice or compression used.   Pt also reports fevers, night sweats, and chills that he believes are associated to sinus tenderness that he has been having over the past 3 days.     Past Medical History  Diagnosis Date  . Systolic heart failure     EF 20-25% by echo 12/28/10  . Nonischemic cardiomyopathy     minimal coronary disease by cath 12/29/10  . Hemoptysis     secondary to pulmonary edema  . Hyperlipidemia   . Chronic anemia   . GI bleed     15 years ago  . Tobacco abuse   . Alcohol abuse   . Osteoarthritis   . Diabetes mellitus, type 2   . Gout     Past Surgical History  Procedure Laterality Date  . Cardiac catheterization      No family history on file.  History  Substance Use Topics  . Smoking status: Current Every Day Smoker -- 4.00 packs/day    Types: Cigarettes  . Smokeless tobacco: Not on file  . Alcohol Use: No     Comment:  occasionally      Review of Systems  Constitutional: Positive for fever and chills.  HENT: Positive for sinus pressure.   Respiratory: Negative for shortness of breath.   Cardiovascular: Negative for chest pain.  Musculoskeletal: Positive for arthralgias (Right knee).  All other systems reviewed and are negative.    Allergies  Review of patient's allergies indicates no known allergies.  Home Medications   Current Outpatient Rx  Name  Route  Sig  Dispense  Refill  . albuterol (PROVENTIL HFA;VENTOLIN HFA) 108 (90 BASE) MCG/ACT inhaler   Inhalation   Inhale 2 puffs into the lungs every 6 (six) hours as needed for wheezing.         [name] aspirin 81 MG chewable tablet   Oral   Chew 81 mg by mouth daily.         . Aspirin-Caffeine (BAYER BACK & BODY PAIN EX ST) 500-32.5 MG TABS   Oral   Take 1 tablet by mouth daily as needed (for pain.).         [name] carvedilol (COREG) 6.25 MG tablet   Oral   Take 1 tablet (6.25 mg total) by mouth 2 (two) times daily with a meal.   60 tablet  6   . furosemide (LASIX) 40 MG tablet   Oral   Take 1 tablet (40 mg total) by mouth daily.   60 tablet   6   . lisinopril (PRINIVIL,ZESTRIL) 10 MG tablet   Oral   Take 1 tablet (10 mg total) by mouth at bedtime.   30 tablet   3   . oxyCODONE-acetaminophen (PERCOCET/ROXICET) 5-325 MG per tablet   Oral   Take 1 tablet by mouth every 4 (four) hours as needed. For arthritis pain in leg   12 tablet   0   . spironolactone (ALDACTONE) 25 MG tablet   Oral   Take 1 tablet (25 mg total) by mouth every morning.   30 tablet   3     BP 133/87  Pulse 123  Temp(Src) 98.3 F (36.8 C) (Oral)  Resp 16  SpO2 97%  Physical Exam  Nursing note and vitals reviewed. Constitutional: He is oriented to person, place, and time. He appears well-developed and well-nourished. No distress.  Non-febrile at 98.3  HENT:  Head: Normocephalic and atraumatic.  No tenderness over frontal or maxillary sinuses.   Eyes: Conjunctivae and EOM are normal.  Neck: Normal range of motion. Neck supple.  Cardiovascular: Regular rhythm and normal heart sounds.   Intact distal pulses, capillary refill < 3 seconds, tachycardic at 123  Pulmonary/Chest: Effort normal and breath sounds normal. No respiratory distress. He has no wheezes.  Lungs clear to auscultation bilaterally.  Musculoskeletal:  All other extremities with normal ROM.  Tenderness to palpation over lateral and medial aspects of right knee, mild superior effusion, no instability palpable, decreased flexion, full extension.  Neurological: He is alert and oriented to person, place, and time.  No sensory deficit  Skin: He is not diaphoretic.  Skin intact, no tenting  Psychiatric: He has a normal mood and affect.    ED Course  Procedures (including critical care time) DIAGNOSTIC STUDIES: Oxygen Saturation is 97% on room air, adequate by my interpretation.    COORDINATION OF CARE: 3:35 PM- Discussed Afrin use with pt and made it clear that he must only use it for 2 days due to addictive qualities.  Advised further treatment with OCM decongestants after that.  Also discussed follow-up with orthopedic doctor for knee pain and short course pain prescription. 3:39 PM- Chart reviewed, pt has no h/o tachycardia in prior visits. 3:48 PM- Pt to be further worked up due to tachycardia without etiology.  Labs and imaging ordered.  Discussed with attending who agrees.  Informed pt, he understands and agrees with plan.   Dg Knee Complete 4 Views Right  05/29/2012  *RADIOLOGY REPORT*  Clinical Data: [place] 2 days ago landing on right knee on concrete, swelling, pain  RIGHT KNEE - COMPLETE 4+ VIEW  Comparison: 01/29/2011  Findings: Osseous demineralization. Tricompartmental joint space narrowing and marginal spur formation. No acute fracture, dislocation, or bone destruction. Bulky spur formation at cranial margin of patella. Large knee joint effusion. Remaining soft  tissues unremarkable.  IMPRESSION: Osseous demineralization. Tricompartmental osteoarthritic changes with large knee joint effusion. No acute fracture or dislocation.   Original Report Authenticated By: Lavonia Dana, M.D.      No diagnosis found.    MDM  Tachycardia, knee pain, nasal congestion  Pt care to be resumed by attending in main ED as HR with out hx of tachycardia requires more in depth work up.  I personally performed the services described in this documentation, which was scribed in my presence. The  recorded information has been reviewed and is accurate.           Verl Dicker, [place] 05/29/12 [phone]

## 2012-05-29 NOTE — ED Provider Notes (Signed)
EKG Sinus tachycardia rate 108 Left atrial abnormality Borderline left axis deviation Borderline T-wave abnormalities inferior leads Compared to EKG dated 12/30/2010, no significant changes and a rate on that EKG was 95  Medical screening examination/treatment/procedure(s) were conducted as a shared visit with non-physician practitioner(s) and myself.  I personally evaluated the patient during the encounter  Patient had tachycardia earlier. He has history of systolic heart failure however the patient did take some decongestant medications.  This may have caused some issues with his tachycardia however he was asymptomatic. There doesn't appear to be evidence of acute pulmonary edema.  Doubt pulmonary wasn't, he denies shortness of breath, chest pain or increased swelling.  His x-ray does show significant osteoarthritis with knee effusion. I recommend followup with orthopedic Dr. I will prescribe medications to help him with his pain.  [name], MD 05/29/12 [phone]

## 2012-06-06 ENCOUNTER — Emergency Department: Payer: Self-pay | Admitting: Emergency Medicine

## 2012-06-06 ENCOUNTER — Ambulatory Visit (HOSPITAL_COMMUNITY): Payer: Self-pay | Attending: Internal Medicine

## 2012-06-13 ENCOUNTER — Other Ambulatory Visit: Payer: Self-pay

## 2012-06-13 LAB — BODY FLUID CELL COUNT WITH DIFFERENTIAL
Basophil: 0 %
Neutrophils: 77 %
Nucleated Cell Count: 3790 /mm3

## 2012-06-16 LAB — SYNOVIAL FLUID, CRYSTAL

## 2012-07-08 ENCOUNTER — Ambulatory Visit (HOSPITAL_COMMUNITY): Payer: Self-pay | Attending: Internal Medicine

## 2012-08-31 ENCOUNTER — Emergency Department: Payer: Self-pay | Admitting: Emergency Medicine

## 2012-09-05 ENCOUNTER — Emergency Department (HOSPITAL_COMMUNITY): Payer: Medicaid Other

## 2012-09-05 ENCOUNTER — Emergency Department (HOSPITAL_COMMUNITY)
Admission: EM | Admit: 2012-09-05 | Discharge: 2012-09-05 | Disposition: A | Discharge status: Home / Self Care | Payer: Medicaid Other | Attending: Emergency Medicine | Admitting: Emergency Medicine

## 2012-09-05 ENCOUNTER — Encounter (HOSPITAL_COMMUNITY): Payer: Self-pay | Admitting: Emergency Medicine

## 2012-09-05 DIAGNOSIS — Z79899 Other long term (current) drug therapy: Secondary | ICD-10-CM | POA: Insufficient documentation

## 2012-09-05 DIAGNOSIS — E119 Type 2 diabetes mellitus without complications: Secondary | ICD-10-CM | POA: Insufficient documentation

## 2012-09-05 DIAGNOSIS — M79609 Pain in unspecified limb: Secondary | ICD-10-CM | POA: Insufficient documentation

## 2012-09-05 DIAGNOSIS — Z8679 Personal history of other diseases of the circulatory system: Secondary | ICD-10-CM | POA: Insufficient documentation

## 2012-09-05 DIAGNOSIS — M109 Gout, unspecified: Secondary | ICD-10-CM | POA: Insufficient documentation

## 2012-09-05 DIAGNOSIS — Z7982 Long term (current) use of aspirin: Secondary | ICD-10-CM | POA: Insufficient documentation

## 2012-09-05 DIAGNOSIS — M7989 Other specified soft tissue disorders: Secondary | ICD-10-CM | POA: Insufficient documentation

## 2012-09-05 DIAGNOSIS — F17200 Nicotine dependence, unspecified, uncomplicated: Secondary | ICD-10-CM | POA: Insufficient documentation

## 2012-09-05 MED ORDER — OXYCODONE-ACETAMINOPHEN 5-325 MG PO TABS
1.0000 | ORAL_TABLET | Freq: Once | ORAL | Status: AC
  Administered 2012-09-05: 1 via ORAL
  Filled 2012-09-05: qty 1

## 2012-09-05 MED ORDER — OXYCODONE-ACETAMINOPHEN 5-325 MG PO TABS: 1.0000 | ORAL_TABLET | ORAL | Status: DC | PRN

## 2012-09-05 MED ORDER — PROBENECID 500 MG PO TABS: 500.0000 mg | ORAL_TABLET | Freq: Two times a day (BID) | ORAL | Status: DC

## 2012-09-05 NOTE — ED Notes (Signed)
Pt c/o r/hand, shoulder and elbowswelling and pain. Hand swollen Pt stated that he was seen 2 weeks ago at [facility] for same concern. Pt stated that he was treated for gout

## 2012-09-05 NOTE — ED Provider Notes (Signed)
  Medical screening examination/treatment/procedure(s) were performed by non-physician practitioner and as supervising physician I was immediately available for consultation/collaboration.   Carmin Muskrat, MD 09/05/12 [phone]

## 2012-09-05 NOTE — ED Provider Notes (Signed)
History     CSN: [id number]  Arrival date & time 09/05/12  1256   First MD Initiated Contact with Patient 09/05/12 1302      Chief Complaint  Patient presents with  . Shoulder Pain    r/shoulder pain x 2 weeks  . Hand Pain  . Arm Swelling    R/hand and arm swelling x 2 weeks    (Consider location/radiation/quality/duration/timing/severity/associated sxs/prior treatment) HPI Comments: Pt is c/o right hand pain:pt is also c/o elbow and shoulder pain:pt states that he was given 3 pills of colchicine and oxycodone at [place] 2 weeks ago:pt when he was continuing to have the symptoms and he ran out of his medication so he needed to be seen again:pt states that his hand is swollen but nothing else is:pt states that he has labs drawn at [facility] that said that he had gout:pt denies previous history:no fever  The history is provided by the patient.    Past Medical History  Diagnosis Date  . Systolic heart failure     EF 20-25% by echo 12/28/10  . Nonischemic cardiomyopathy     minimal coronary disease by cath 12/29/10  . Hemoptysis     secondary to pulmonary edema  . Hyperlipidemia   . Chronic anemia   . GI bleed     15 years ago  . Tobacco abuse   . Alcohol abuse   . Osteoarthritis   . Diabetes mellitus, type 2   . Gout     Past Surgical History  Procedure Laterality Date  . Cardiac catheterization      Family History  Problem Relation Age of Onset  . Diabetes Mother   . Hypertension Mother   . Diabetes Father   . Hypertension Father     History  Substance Use Topics  . Smoking status: Current Every Day Smoker -- 4.00 packs/day    Types: Cigarettes  . Smokeless tobacco: Not on file  . Alcohol Use: Yes     Comment: occasionally      Review of Systems  Constitutional: Negative.   Respiratory: Negative.   Cardiovascular: Negative.     Allergies  Review of patient's allergies indicates no known allergies.  Home Medications   Current Outpatient Rx   Name  Route  Sig  Dispense  Refill  . acetaminophen (TYLENOL) 500 MG tablet   Oral   Take 1,000 mg by mouth every 6 (six) hours as needed for pain.         [name] albuterol (PROVENTIL HFA;VENTOLIN HFA) 108 (90 BASE) MCG/ACT inhaler   Inhalation   Inhale 2 puffs into the lungs every 6 (six) hours as needed for wheezing.         [name] aspirin EC 81 MG tablet   Oral   Take 81 mg by mouth every morning.         [name] oxyCODONE-acetaminophen (PERCOCET/ROXICET) 5-325 MG per tablet   Oral   Take 1 tablet by mouth every 4 (four) hours as needed. For arthritis pain in leg   12 tablet   0   . oxyCODONE-acetaminophen (PERCOCET/ROXICET) 5-325 MG per tablet   Oral   Take 1 tablet by mouth every 4 (four) hours as needed for pain.   10 tablet   0   . probenecid (BENEMID) 500 MG tablet   Oral   Take 1 tablet (500 mg total) by mouth 2 (two) times daily.   20 tablet   0     BP 130/90  Pulse 102  Temp(Src) 99.2 F (37.3 C) (Oral)  Resp 20  Wt 160 lb 9 oz (72.831 kg)  BMI 19.55 kg/m2  SpO2 100%  Physical Exam  Nursing note and vitals reviewed. Constitutional: He is oriented to person, place, and time. He appears well-developed and well-nourished.  Cardiovascular: Normal rate and regular rhythm.   Pulmonary/Chest: Effort normal and breath sounds normal.  Musculoskeletal: Normal range of motion.  Neurological: He is alert and oriented to person, place, and time.  Skin:  Swelling noted to the right hand:with warmth noted:pulses intact:pt has full rom    ED Course  Procedures (including critical care time)  Labs Reviewed - No data to display Dg Hand Complete Right  09/05/2012   *RADIOLOGY REPORT*  Clinical Data: Right-sided shoulder pain and hand pain.  RIGHT HAND - COMPLETE 3+ VIEW  Comparison: No priors.  Findings: There is a small dense foreign body projecting over the soft tissues in the dorsal aspect of the third finger immediately dorsal to the distal aspect of the proximal  phalanx.  Overlying soft tissues appear markedly swollen.  No acute displaced fracture, subluxation or dislocation is noted.  Old fracture of the ulnar styloid is incidentally noted.  IMPRESSION: 1.  Negative for acute displaced fracture. 2.  Radiopaque foreign body in the soft tissues adjacent to the dorsal aspect of the distal third proximal phalanx.   Original Report Authenticated By: Vinnie Langton, M.D.     1. Gout       MDM  Pt treated for gout:fb related to old injury:pt treated with probenicid and oxycodone        Glendell Docker, NP 09/05/12 [phone]

## 2012-09-05 NOTE — Progress Notes (Signed)
[facility] has seen patient. Patient stated that he was pending medicaid. Gave him primary care resources for him to utilize while he is waiting on his medicaid.

## 2012-09-13 ENCOUNTER — Emergency Department (HOSPITAL_COMMUNITY)
Admission: EM | Admit: 2012-09-13 | Discharge: 2012-09-13 | Disposition: A | Discharge status: Home / Self Care | Payer: Medicaid Other | Attending: Emergency Medicine | Admitting: Emergency Medicine

## 2012-09-13 ENCOUNTER — Encounter (HOSPITAL_COMMUNITY): Payer: Self-pay | Admitting: Emergency Medicine

## 2012-09-13 DIAGNOSIS — Z8709 Personal history of other diseases of the respiratory system: Secondary | ICD-10-CM | POA: Insufficient documentation

## 2012-09-13 DIAGNOSIS — E119 Type 2 diabetes mellitus without complications: Secondary | ICD-10-CM | POA: Insufficient documentation

## 2012-09-13 DIAGNOSIS — Z862 Personal history of diseases of the blood and blood-forming organs and certain disorders involving the immune mechanism: Secondary | ICD-10-CM | POA: Insufficient documentation

## 2012-09-13 DIAGNOSIS — I5020 Unspecified systolic (congestive) heart failure: Secondary | ICD-10-CM | POA: Insufficient documentation

## 2012-09-13 DIAGNOSIS — Z9861 Coronary angioplasty status: Secondary | ICD-10-CM | POA: Insufficient documentation

## 2012-09-13 DIAGNOSIS — M25539 Pain in unspecified wrist: Secondary | ICD-10-CM | POA: Insufficient documentation

## 2012-09-13 DIAGNOSIS — F17200 Nicotine dependence, unspecified, uncomplicated: Secondary | ICD-10-CM | POA: Insufficient documentation

## 2012-09-13 DIAGNOSIS — M1990 Unspecified osteoarthritis, unspecified site: Secondary | ICD-10-CM | POA: Insufficient documentation

## 2012-09-13 DIAGNOSIS — Z7982 Long term (current) use of aspirin: Secondary | ICD-10-CM | POA: Insufficient documentation

## 2012-09-13 DIAGNOSIS — Z79899 Other long term (current) drug therapy: Secondary | ICD-10-CM | POA: Insufficient documentation

## 2012-09-13 DIAGNOSIS — Z8719 Personal history of other diseases of the digestive system: Secondary | ICD-10-CM | POA: Insufficient documentation

## 2012-09-13 DIAGNOSIS — Z8679 Personal history of other diseases of the circulatory system: Secondary | ICD-10-CM | POA: Insufficient documentation

## 2012-09-13 DIAGNOSIS — Z8639 Personal history of other endocrine, nutritional and metabolic disease: Secondary | ICD-10-CM | POA: Insufficient documentation

## 2012-09-13 DIAGNOSIS — M109 Gout, unspecified: Secondary | ICD-10-CM

## 2012-09-13 MED ORDER — COLCHICINE 0.6 MG PO TABS
0.6000 mg | ORAL_TABLET | Freq: Once | ORAL | Status: AC
  Administered 2012-09-13: 0.6 mg via ORAL
  Filled 2012-09-13: qty 1

## 2012-09-13 MED ORDER — PREDNISONE 50 MG PO TABS: 50.0000 mg | ORAL_TABLET | Freq: Every day | ORAL | Status: DC

## 2012-09-13 MED ORDER — DEXAMETHASONE 1 MG/ML PO CONC
10.0000 mg | Freq: Once | ORAL | Status: AC
  Administered 2012-09-13: 10 mg via ORAL
  Filled 2012-09-13: qty 10

## 2012-09-13 MED ORDER — COLCHICINE 0.6 MG PO TABS
1.2000 mg | ORAL_TABLET | Freq: Once | ORAL | Status: AC
  Administered 2012-09-13: 1.2 mg via ORAL
  Filled 2012-09-13: qty 2

## 2012-09-13 MED ORDER — IBUPROFEN 400 MG PO TABS: 400.0000 mg | ORAL_TABLET | Freq: Four times a day (QID) | ORAL | Status: DC | PRN

## 2012-09-13 MED ORDER — HYDROCODONE-ACETAMINOPHEN 5-325 MG PO TABS: 1.0000 | ORAL_TABLET | Freq: Four times a day (QID) | ORAL | Status: DC | PRN

## 2012-09-13 MED ORDER — HYDROMORPHONE HCL PF 2 MG/ML IJ SOLN
2.0000 mg | Freq: Once | INTRAMUSCULAR | Status: AC
  Administered 2012-09-13: 2 mg via INTRAMUSCULAR

## 2012-09-13 MED ORDER — HYDROMORPHONE HCL PF 2 MG/ML IJ SOLN
2.0000 mg | Freq: Once | INTRAMUSCULAR | Status: DC
  Filled 2012-09-13: qty 1

## 2012-09-13 NOTE — ED Provider Notes (Signed)
History     CSN: [id number]  Arrival date & time 09/13/12  0911   First MD Initiated Contact with Patient 09/13/12 [phone]      Chief Complaint  Patient presents with  . Hand Pain  . Wrist Pain    (Consider location/radiation/quality/duration/timing/severity/associated sxs/prior treatment) HPI Comments: PT comes in with cc of hand pain and wrist pain. Pt has hx of gout and was seen 2 weeks ago for this. States that he couldn't fill probenecid, pharmacy didn't have it. He took the norco, got a little better, but then his gout flared again. Denies alcohol abuse, or heavy red meat use. No n/v/f/c. The pain is in the right wrist, and is the same area that has been affected in the past.   Patient is a 57 y.o. male presenting with hand pain and wrist pain. The history is provided by the patient and medical records.  Hand Pain This is a recurrent problem. The current episode started more than 2 days ago. The problem occurs constantly. The problem has been gradually worsening. Pertinent negatives include no chest pain, no abdominal pain and no shortness of breath. The symptoms are relieved by narcotics. He has tried a warm compress for the symptoms. The treatment provided no relief.  Wrist Pain This is a recurrent problem. The current episode started more than 2 days ago. Pertinent negatives include no chest pain, no abdominal pain and no shortness of breath. The symptoms are relieved by narcotics. He has tried a warm compress for the symptoms. The treatment provided no relief.    Past Medical History  Diagnosis Date  . Systolic heart failure     EF 20-25% by echo 12/28/10  . Nonischemic cardiomyopathy     minimal coronary disease by cath 12/29/10  . Hemoptysis     secondary to pulmonary edema  . Hyperlipidemia   . Chronic anemia   . GI bleed     15 years ago  . Tobacco abuse   . Alcohol abuse   . Osteoarthritis   . Diabetes mellitus, type 2   . Gout     Past Surgical History   Procedure Laterality Date  . Cardiac catheterization      Family History  Problem Relation Age of Onset  . Diabetes Mother   . Hypertension Mother   . Diabetes Father   . Hypertension Father     History  Substance Use Topics  . Smoking status: Current Every Day Smoker -- 4.00 packs/day    Types: Cigarettes  . Smokeless tobacco: Not on file  . Alcohol Use: Yes     Comment: occasionally      Review of Systems  Constitutional: Negative for fever, chills, activity change and appetite change.  Respiratory: Negative for cough and shortness of breath.   Cardiovascular: Negative for chest pain.  Gastrointestinal: Negative for abdominal pain.  Genitourinary: Negative for dysuria.  Musculoskeletal: Positive for arthralgias.  Neurological: Negative for numbness.    Allergies  Review of patient's allergies indicates no known allergies.  Home Medications   Current Outpatient Rx  Name  Route  Sig  Dispense  Refill  . albuterol (PROVENTIL HFA;VENTOLIN HFA) 108 (90 BASE) MCG/ACT inhaler   Inhalation   Inhale 2 puffs into the lungs every 6 (six) hours as needed for wheezing.         [name] aspirin 325 MG tablet   Oral   Take 325 mg by mouth daily.         [name]  Aspirin-Caffeine (BAYER BACK & BODY PAIN EX ST) 500-32.5 MG TABS   Oral   Take 1 tablet by mouth every 6 (six) hours as needed (pain).           BP 149/89  Pulse 92  Temp(Src) 97.8 F (36.6 C) (Oral)  Resp 20  SpO2 100%  Physical Exam  Nursing note and vitals reviewed. Constitutional: He is oriented to person, place, and time. He appears well-developed.  HENT:  Head: Normocephalic and atraumatic.  Eyes: Conjunctivae and EOM are normal. Pupils are equal, round, and reactive to light.  Neck: Normal range of motion. Neck supple.  Cardiovascular: Normal rate and regular rhythm.   Pulmonary/Chest: Effort normal and breath sounds normal.  Abdominal: Soft. Bowel sounds are normal. He exhibits no distension. There  is no tenderness. There is no rebound and no guarding.  Musculoskeletal:       Right wrist: He exhibits decreased range of motion, tenderness, swelling and effusion. He exhibits no crepitus.  Neurological: He is alert and oriented to person, place, and time.  Skin: Skin is warm.    ED Course  Procedures (including critical care time)  Labs Reviewed - No data to display No results found.   1. Gout attack       MDM  Pt comes in w/ cc of gouty pain. Pt's exam is consistent with gout -has swelling, with tenderness to palpation and warmth to touch. Will give colchicine and steroids this time, along with norco for break through pain. Neurovascularly intact.   Varney Biles, MD 09/13/12 1054

## 2012-09-13 NOTE — ED Notes (Signed)
Pt from home reports that he has had R hand, wrist pain and swelling x4 weeks. Pt reports a hx of gout in leg but not arms. Pt A&O and in NAD

## 2012-10-16 ENCOUNTER — Emergency Department: Payer: Self-pay | Admitting: Emergency Medicine

## 2012-10-27 ENCOUNTER — Emergency Department: Payer: Self-pay | Admitting: Emergency Medicine

## 2012-11-19 ENCOUNTER — Emergency Department: Payer: Self-pay | Admitting: Internal Medicine

## 2012-11-19 LAB — URINALYSIS, COMPLETE
Bilirubin,UR: NEGATIVE
Glucose,UR: NEGATIVE mg/dL (ref 0–75)
Ketone: NEGATIVE
Leukocyte Esterase: NEGATIVE
Nitrite: NEGATIVE
Ph: 5 (ref 4.5–8.0)
Specific Gravity: 1.024 (ref 1.003–1.030)
Squamous Epithelial: NONE SEEN

## 2012-11-19 LAB — BASIC METABOLIC PANEL
Calcium, Total: 9.2 mg/dL (ref 8.5–10.1)
Creatinine: 0.8 mg/dL (ref 0.60–1.30)
EGFR (African American): >60
EGFR (Non-African Amer.): >60
Osmolality: 273 (ref 275–301)
Sodium: 137 mmol/L (ref 136–145)

## 2012-11-19 LAB — CBC
HCT: 38.2 % — ABNORMAL LOW (ref 40.0–52.0)
MCHC: 33.3 g/dL (ref 32.0–36.0)
Platelet: 247 10*3/uL (ref 150–440)
RDW: 16.4 % — ABNORMAL HIGH (ref 11.5–14.5)
WBC: 8.1 10*3/uL (ref 3.8–10.6)

## 2012-12-05 ENCOUNTER — Emergency Department (HOSPITAL_COMMUNITY)
Admission: EM | Admit: 2012-12-05 | Discharge: 2012-12-05 | Disposition: A | Discharge status: Home / Self Care | Payer: Medicaid Other | Attending: Emergency Medicine | Admitting: Emergency Medicine

## 2012-12-05 ENCOUNTER — Encounter (HOSPITAL_COMMUNITY): Payer: Self-pay | Admitting: Emergency Medicine

## 2012-12-05 DIAGNOSIS — Z79899 Other long term (current) drug therapy: Secondary | ICD-10-CM | POA: Insufficient documentation

## 2012-12-05 DIAGNOSIS — M25569 Pain in unspecified knee: Secondary | ICD-10-CM | POA: Insufficient documentation

## 2012-12-05 DIAGNOSIS — E785 Hyperlipidemia, unspecified: Secondary | ICD-10-CM | POA: Insufficient documentation

## 2012-12-05 DIAGNOSIS — R609 Edema, unspecified: Secondary | ICD-10-CM | POA: Insufficient documentation

## 2012-12-05 DIAGNOSIS — F17200 Nicotine dependence, unspecified, uncomplicated: Secondary | ICD-10-CM | POA: Insufficient documentation

## 2012-12-05 DIAGNOSIS — E119 Type 2 diabetes mellitus without complications: Secondary | ICD-10-CM | POA: Insufficient documentation

## 2012-12-05 DIAGNOSIS — J14 Pneumonia due to Hemophilus influenzae: Secondary | ICD-10-CM | POA: Insufficient documentation

## 2012-12-05 DIAGNOSIS — M109 Gout, unspecified: Secondary | ICD-10-CM | POA: Insufficient documentation

## 2012-12-05 DIAGNOSIS — M1990 Unspecified osteoarthritis, unspecified site: Secondary | ICD-10-CM | POA: Insufficient documentation

## 2012-12-05 DIAGNOSIS — Z7982 Long term (current) use of aspirin: Secondary | ICD-10-CM | POA: Insufficient documentation

## 2012-12-05 MED ORDER — COLCHICINE 0.6 MG PO TABS
1.2000 mg | ORAL_TABLET | Freq: Once | ORAL | Status: AC
  Administered 2012-12-05: 1.2 mg via ORAL
  Filled 2012-12-05 (×2): qty 2

## 2012-12-05 MED ORDER — COLCHICINE 0.6 MG PO TABS
0.6000 mg | ORAL_TABLET | Freq: Once | ORAL | Status: AC
  Administered 2012-12-05: 0.6 mg via ORAL
  Filled 2012-12-05: qty 1

## 2012-12-05 MED ORDER — OXYCODONE-ACETAMINOPHEN 5-325 MG PO TABS: ORAL_TABLET | ORAL | Status: DC

## 2012-12-05 MED ORDER — COLCHICINE 0.6 MG PO TABS: 0.6000 mg | ORAL_TABLET | Freq: Once | ORAL | Status: DC

## 2012-12-05 MED ORDER — DEXAMETHASONE SODIUM PHOSPHATE 10 MG/ML IJ SOLN
10.0000 mg | Freq: Once | INTRAMUSCULAR | Status: AC
  Administered 2012-12-05: 10 mg via INTRAVENOUS
  Filled 2012-12-05: qty 1

## 2012-12-05 MED ORDER — OXYCODONE-ACETAMINOPHEN 5-325 MG PO TABS
2.0000 | ORAL_TABLET | Freq: Once | ORAL | Status: AC
  Administered 2012-12-05: 2 via ORAL
  Filled 2012-12-05: qty 2

## 2012-12-05 MED ORDER — PREDNISONE 20 MG PO TABS: 40.0000 mg | ORAL_TABLET | Freq: Every day | ORAL | Status: DC

## 2012-12-05 MED ORDER — PROBENECID 500 MG PO TABS: 500.0000 mg | ORAL_TABLET | Freq: Two times a day (BID) | ORAL | Status: DC

## 2012-12-05 NOTE — ED Notes (Signed)
Pt states that last night that he started having pian in left elbow that radiates to left hand and slo pain in right knee.  Pt does have PMH gout.

## 2012-12-05 NOTE — ED Provider Notes (Signed)
CSN: [id number]     Arrival date & time 12/05/12  1000 History   First MD Initiated Contact with Patient 12/05/12 1010     Chief Complaint  Patient presents with  . left arm pain   . Knee Pain    right   (Consider location/radiation/quality/duration/timing/severity/associated sxs/prior Treatment) HPI Pt is a 57yo male with hx of gout c/o gouty attack in his left elbow, hand and right knee.  Pt states flare started about 3 days ago and he ran out of his gout medication 2 weeks ago.  Pain is constant, 10/10 sharp pain in left elbow.  Painful to palpation and movement.  Pain also in MCP and PIP of left hand with swelling.  Pain in right knee, 8/10, sharp, worse with movement and palpation.  Denies trauma to areas.  States he has f/u appointment with specialist Oct. 12th.  Normally takes percocet with colchicine for pain.  Denies fever, n/v/d.  Past Medical History  Diagnosis Date  . Systolic heart failure     EF 20-25% by echo 12/28/10  . Nonischemic cardiomyopathy     minimal coronary disease by cath 12/29/10  . Hemoptysis     secondary to pulmonary edema  . Hyperlipidemia   . Chronic anemia   . GI bleed     15 years ago  . Tobacco abuse   . Alcohol abuse   . Osteoarthritis   . Gout   . Diabetes mellitus, type 2     pt reports his DM is gone   Past Surgical History  Procedure Laterality Date  . Cardiac catheterization     Family History  Problem Relation Age of Onset  . Diabetes Mother   . Hypertension Mother   . Diabetes Father   . Hypertension Father    History  Substance Use Topics  . Smoking status: Current Every Day Smoker -- 4.00 packs/day    Types: Cigarettes  . Smokeless tobacco: Not on file  . Alcohol Use: Yes     Comment: occasionally    Review of Systems  Constitutional: Negative for fever and chills.  Gastrointestinal: Negative for nausea and vomiting.  Musculoskeletal: Positive for joint swelling and arthralgias.  Skin: Negative for rash and wound.    All other systems reviewed and are negative.    Allergies  Review of patient's allergies indicates no known allergies.  Home Medications   Current Outpatient Rx  Name  Route  Sig  Dispense  Refill  . albuterol (PROVENTIL HFA;VENTOLIN HFA) 108 (90 BASE) MCG/ACT inhaler   Inhalation   Inhale 2 puffs into the lungs every 6 (six) hours as needed for wheezing.         [name] aspirin 325 MG tablet   Oral   Take 325 mg by mouth daily.         . Aspirin-Caffeine (BAYER BACK & BODY PAIN EX ST) 500-32.5 MG TABS   Oral   Take 1 tablet by mouth every 6 (six) hours as needed (pain).         [name] ibuprofen (ADVIL,MOTRIN) 400 MG tablet   Oral   Take 1 tablet (400 mg total) by mouth every 6 (six) hours as needed for pain.   30 tablet   0   . oxyCODONE-acetaminophen (PERCOCET/ROXICET) 5-325 MG per tablet      Take 1-2 pills every 4-6 hours as needed for pain.   10 tablet   0   . predniSONE (DELTASONE) 20 MG tablet   Oral  Take 2 tablets (40 mg total) by mouth daily.   10 tablet   0   . probenecid (BENEMID) 500 MG tablet   Oral   Take 1 tablet (500 mg total) by mouth 2 (two) times daily.   20 tablet   0    BP 129/93  Pulse 101  Temp(Src) 97.8 F (36.6 C) (Oral)  Resp 22  SpO2 98% Physical Exam  Nursing note and vitals reviewed. Constitutional: He appears well-developed and well-nourished.  HENT:  Head: Normocephalic and atraumatic.  Eyes: Conjunctivae are normal. No scleral icterus.  Neck: Normal range of motion.  Cardiovascular: Normal rate, regular rhythm and normal heart sounds.   Pulmonary/Chest: Effort normal and breath sounds normal. No respiratory distress. He has no wheezes. He has no rales. He exhibits no tenderness.  Abdominal: Soft. Bowel sounds are normal. He exhibits no distension and no mass. There is no tenderness. There is no rebound and no guarding.  Musculoskeletal: Normal range of motion. He exhibits edema and tenderness.       Left elbow: He  exhibits swelling. Tenderness found.       Right knee: He exhibits swelling. Tenderness found.       Arms:      Legs: TTP left elbow and MCP and PIP joints of left hand, mild to moderate swelling.  Decreased ROM in left elbow. Warm to touch.  Radial pulse 2+, Cap refill <3 sec.   Right knee-moderate edema, warm to touch, TTP.  Pedial pulses 2+.  Skin in tact.  Neurological: He is alert.  Skin: Skin is warm and dry.    ED Course  Procedures (including critical care time) Labs Review Labs Reviewed - No data to display Imaging Review No results found.  MDM   1. Gout attack    Hx and physical consistent with gout, warm swollen tender joints.  No recent trauma. No fever, n/v/d. Do not believe imaging would be of benefit at this time. Pt does have f/u with specialist Oct 12 however does not recall name of provider or the office.  Will tx with colchicine, percocet and decadron in ED.  Rx: probenicid, prednisone, and percocet for breakthrough pain.  Return precautions provided. Pt verbalized understanding and agreement with tx plan. Vitals: unremarkable. Discharged in stable condition.    Discussed pt with attending during ED encounter.       Noland Fordyce, PA-C 12/05/12 1215

## 2012-12-05 NOTE — ED Notes (Signed)
Pt from home reporting L elbow pain with swelling and R knee, great toe pain with swelling. Pt has hx of gout. All pain locations are warm to touch. Pt adds that he is out of his gout medication and cannot see his PCP Oct 12th. Pt denies SOB, CP or other c/o. Pt is A&O and in NAD

## 2012-12-05 NOTE — ED Notes (Signed)
Pt escorted to discharge window. Pt verbalized understanding discharge instructions. In no acute distress.  

## 2012-12-05 NOTE — ED Provider Notes (Signed)
Medical screening examination/treatment/procedure(s) were performed by non-physician practitioner and as supervising physician I was immediately available for consultation/collaboration.   Neta Ehlers, MD 12/05/12 1620

## 2013-02-06 ENCOUNTER — Emergency Department: Payer: Self-pay | Admitting: Emergency Medicine

## 2013-02-09 ENCOUNTER — Emergency Department (HOSPITAL_COMMUNITY)
Admission: EM | Admit: 2013-02-09 | Discharge: 2013-02-09 | Disposition: A | Discharge status: Home / Self Care | Payer: Medicaid Other | Attending: Emergency Medicine | Admitting: Emergency Medicine

## 2013-02-09 ENCOUNTER — Encounter (HOSPITAL_COMMUNITY): Payer: Self-pay | Admitting: Emergency Medicine

## 2013-02-09 DIAGNOSIS — F17200 Nicotine dependence, unspecified, uncomplicated: Secondary | ICD-10-CM | POA: Insufficient documentation

## 2013-02-09 DIAGNOSIS — M109 Gout, unspecified: Secondary | ICD-10-CM

## 2013-02-09 DIAGNOSIS — Z95818 Presence of other cardiac implants and grafts: Secondary | ICD-10-CM | POA: Insufficient documentation

## 2013-02-09 DIAGNOSIS — I5020 Unspecified systolic (congestive) heart failure: Secondary | ICD-10-CM | POA: Insufficient documentation

## 2013-02-09 DIAGNOSIS — M1990 Unspecified osteoarthritis, unspecified site: Secondary | ICD-10-CM | POA: Insufficient documentation

## 2013-02-09 DIAGNOSIS — Z7982 Long term (current) use of aspirin: Secondary | ICD-10-CM | POA: Insufficient documentation

## 2013-02-09 DIAGNOSIS — Z862 Personal history of diseases of the blood and blood-forming organs and certain disorders involving the immune mechanism: Secondary | ICD-10-CM | POA: Insufficient documentation

## 2013-02-09 DIAGNOSIS — E119 Type 2 diabetes mellitus without complications: Secondary | ICD-10-CM | POA: Insufficient documentation

## 2013-02-09 MED ORDER — OXYCODONE-ACETAMINOPHEN 5-325 MG PO TABS: 1.0000 | ORAL_TABLET | Freq: Four times a day (QID) | ORAL | Status: DC | PRN

## 2013-02-09 MED ORDER — COLCHICINE 0.6 MG PO TABS: 0.6000 mg | ORAL_TABLET | Freq: Every day | ORAL | Status: DC

## 2013-02-09 MED ORDER — INDOMETHACIN 25 MG PO CAPS
50.0000 mg | ORAL_CAPSULE | Freq: Three times a day (TID) | ORAL | Status: DC | PRN
Start: 2013-02-09 — End: 2013-03-14

## 2013-02-09 NOTE — ED Provider Notes (Signed)
CSN: [id number]     Arrival date & time 02/09/13  1407 History   First MD Initiated Contact with Patient 02/09/13 1414     This chart was scribed for Hyman Bible, by Lovena Le Day, ED scribe. This patient was seen in room WTR5/WTR5 and the patient's care was started at 1414.  Chief Complaint  Patient presents with  . Foot Pain  . Toe Pain    pain in l/first toe x 2 days   Patient is a 57 y.o. male presenting with toe pain. The history is provided by the patient. No language interpreter was used.  Toe Pain Pertinent negatives include no chest pain, no abdominal pain and no shortness of breath.   HPI Comments: Travis [name] is a 57 y.o. male who presents to the Emergency Department complaining of a constant, painful/swollen left great toe, no direct trauma or injury but states it feels similar to previous episodes of gout. He states using ibuprofen w/out any relief. He states has used percocet for gout previously which seemed to help his symptoms. He denies recent alcohol intake and no red meat. He denies any fever or chills.   Past Medical History  Diagnosis Date  . Systolic heart failure     EF 20-25% by echo 12/28/10  . Nonischemic cardiomyopathy     minimal coronary disease by cath 12/29/10  . Hemoptysis     secondary to pulmonary edema  . Hyperlipidemia   . Chronic anemia   . GI bleed     15 years ago  . Tobacco abuse   . Alcohol abuse   . Osteoarthritis   . Gout   . Diabetes mellitus, type 2     pt reports his DM is gone   Past Surgical History  Procedure Laterality Date  . Cardiac catheterization     Family History  Problem Relation Age of Onset  . Diabetes Mother   . Hypertension Mother   . Diabetes Father   . Hypertension Father    History  Substance Use Topics  . Smoking status: Current Every Day Smoker -- 4.00 packs/day    Types: Cigarettes  . Smokeless tobacco: Not on file  . Alcohol Use: Yes     Comment: occasionally    Review of Systems   Constitutional: Negative for fever and chills.  HENT: Negative for congestion and rhinorrhea.   Respiratory: Negative for cough and shortness of breath.   Cardiovascular: Negative for chest pain.  Gastrointestinal: Negative for nausea, vomiting, abdominal pain and diarrhea.  Musculoskeletal: Negative for back pain.  Skin: Negative for color change and rash.  Neurological: Negative for syncope.  All other systems reviewed and are negative.   A complete 10 system review of systems was obtained and all systems are negative except as noted in the HPI and PMH.   Allergies  Review of patient's allergies indicates no known allergies.  Home Medications   Current Outpatient Rx  Name  Route  Sig  Dispense  Refill  . aspirin 325 MG tablet   Oral   Take 325 mg by mouth daily.         . Aspirin-Caffeine (BAYER BACK & BODY PAIN EX ST) 500-32.5 MG TABS   Oral   Take 1 tablet by mouth every 6 (six) hours as needed (pain).         [name] ibuprofen (ADVIL,MOTRIN) 400 MG tablet   Oral   Take 1 tablet (400 mg total) by mouth every 6 (six) hours as needed  for pain.   30 tablet   0    Triage Vitals: BP 156/98  Pulse 101  Temp(Src) 98.7 F (37.1 C) (Oral)  Resp 18  SpO2 99% Physical Exam  Nursing note and vitals reviewed. Constitutional: He is oriented to person, place, and time. He appears well-developed and well-nourished. No distress.  HENT:  Head: Normocephalic and atraumatic.  Eyes: Conjunctivae are normal. Right eye exhibits no discharge. Left eye exhibits no discharge.  Neck: Normal range of motion. No tracheal deviation present.  Cardiovascular: Normal rate, regular rhythm and normal heart sounds.   No murmur heard. DP pulse 2+ of left foot.  Pulmonary/Chest: Effort normal and breath sounds normal. No respiratory distress. He has no wheezes. He has no rales.  Musculoskeletal: Normal range of motion. He exhibits edema and tenderness.  Mild swelling and erythema over MTP of the  left great toe.  ROM limited secondary to pain.   Neurological: He is alert and oriented to person, place, and time.  Distal sensation of left great toe intact.  Skin: Skin is warm and dry.  Psychiatric: He has a normal mood and affect. Thought content normal.    ED Course  Procedures (including critical care time) DIAGNOSTIC STUDIES: Oxygen Saturation is 99% on room air, normal by my interpretation.    COORDINATION OF CARE: At 229 PM Discussed treatment plan with patient which includes pain medicine. Patient agrees.   Labs Review Labs Reviewed - No data to display Imaging Review No results found.  EKG Interpretation   None       MDM  No diagnosis found. Patient with a history of Gout presents today with erythema, edema, and pain of the MTP of the great toe.  Signs and symptoms consistent with Gout.  Patient discharged home with Indomethicin, Colchicine, and pain medications.  Patient stable for discharge.    Hyman Bible, PA-C 02/10/13 2337

## 2013-02-09 NOTE — ED Notes (Signed)
Pt reports pain and swelling  in l/foot at l/first toe x 2 days

## 2013-02-18 NOTE — ED Provider Notes (Signed)
Medical screening examination/treatment/procedure(s) were performed by non-physician practitioner and as supervising physician I was immediately available for consultation/collaboration.  EKG Interpretation   None       Rolland Porter, MD, Abram Sander   Janice Norrie, MD 02/18/13 1505

## 2013-03-14 ENCOUNTER — Inpatient Hospital Stay (HOSPITAL_COMMUNITY)
Admission: EM | Admit: 2013-03-14 | Discharge: 2013-03-25 | DRG: 470 | Disposition: A | Discharge status: Home Health | Payer: Medicaid Other | Attending: Internal Medicine | Admitting: Internal Medicine

## 2013-03-14 ENCOUNTER — Encounter (HOSPITAL_COMMUNITY): Payer: Self-pay | Admitting: Emergency Medicine

## 2013-03-14 ENCOUNTER — Emergency Department (HOSPITAL_COMMUNITY): Payer: Medicaid Other

## 2013-03-14 DIAGNOSIS — I426 Alcoholic cardiomyopathy: Secondary | ICD-10-CM | POA: Diagnosis present

## 2013-03-14 DIAGNOSIS — Z87891 Personal history of nicotine dependence: Secondary | ICD-10-CM | POA: Diagnosis present

## 2013-03-14 DIAGNOSIS — S72009A Fracture of unspecified part of neck of unspecified femur, initial encounter for closed fracture: Secondary | ICD-10-CM | POA: Insufficient documentation

## 2013-03-14 DIAGNOSIS — S72033A Displaced midcervical fracture of unspecified femur, initial encounter for closed fracture: Principal | ICD-10-CM | POA: Diagnosis present

## 2013-03-14 DIAGNOSIS — F1010 Alcohol abuse, uncomplicated: Secondary | ICD-10-CM | POA: Diagnosis present

## 2013-03-14 DIAGNOSIS — F10929 Alcohol use, unspecified with intoxication, unspecified: Secondary | ICD-10-CM

## 2013-03-14 DIAGNOSIS — I428 Other cardiomyopathies: Secondary | ICD-10-CM | POA: Diagnosis present

## 2013-03-14 DIAGNOSIS — M109 Gout, unspecified: Secondary | ICD-10-CM | POA: Diagnosis present

## 2013-03-14 DIAGNOSIS — E876 Hypokalemia: Secondary | ICD-10-CM | POA: Diagnosis present

## 2013-03-14 DIAGNOSIS — I5022 Chronic systolic (congestive) heart failure: Secondary | ICD-10-CM | POA: Diagnosis not present

## 2013-03-14 DIAGNOSIS — K219 Gastro-esophageal reflux disease without esophagitis: Secondary | ICD-10-CM | POA: Diagnosis present

## 2013-03-14 DIAGNOSIS — D509 Iron deficiency anemia, unspecified: Secondary | ICD-10-CM | POA: Diagnosis not present

## 2013-03-14 DIAGNOSIS — R9431 Abnormal electrocardiogram [ECG] [EKG]: Secondary | ICD-10-CM | POA: Diagnosis not present

## 2013-03-14 DIAGNOSIS — R7309 Other abnormal glucose: Secondary | ICD-10-CM

## 2013-03-14 DIAGNOSIS — E119 Type 2 diabetes mellitus without complications: Secondary | ICD-10-CM | POA: Diagnosis present

## 2013-03-14 DIAGNOSIS — F17200 Nicotine dependence, unspecified, uncomplicated: Secondary | ICD-10-CM | POA: Diagnosis present

## 2013-03-14 DIAGNOSIS — Z9119 Patient's noncompliance with other medical treatment and regimen: Secondary | ICD-10-CM | POA: Diagnosis not present

## 2013-03-14 DIAGNOSIS — Z91199 Patient's noncompliance with other medical treatment and regimen due to unspecified reason: Secondary | ICD-10-CM

## 2013-03-14 DIAGNOSIS — D62 Acute posthemorrhagic anemia: Secondary | ICD-10-CM | POA: Diagnosis not present

## 2013-03-14 DIAGNOSIS — Z01810 Encounter for preprocedural cardiovascular examination: Secondary | ICD-10-CM | POA: Diagnosis not present

## 2013-03-14 DIAGNOSIS — N259 Disorder resulting from impaired renal tubular function, unspecified: Secondary | ICD-10-CM

## 2013-03-14 DIAGNOSIS — R079 Chest pain, unspecified: Secondary | ICD-10-CM

## 2013-03-14 DIAGNOSIS — S72001A Fracture of unspecified part of neck of right femur, initial encounter for closed fracture: Secondary | ICD-10-CM

## 2013-03-14 DIAGNOSIS — E785 Hyperlipidemia, unspecified: Secondary | ICD-10-CM | POA: Diagnosis present

## 2013-03-14 DIAGNOSIS — M1990 Unspecified osteoarthritis, unspecified site: Secondary | ICD-10-CM | POA: Diagnosis present

## 2013-03-14 DIAGNOSIS — W19XXXA Unspecified fall, initial encounter: Secondary | ICD-10-CM | POA: Diagnosis present

## 2013-03-14 DIAGNOSIS — J209 Acute bronchitis, unspecified: Secondary | ICD-10-CM

## 2013-03-14 DIAGNOSIS — Z7982 Long term (current) use of aspirin: Secondary | ICD-10-CM | POA: Diagnosis not present

## 2013-03-14 DIAGNOSIS — Z833 Family history of diabetes mellitus: Secondary | ICD-10-CM | POA: Diagnosis not present

## 2013-03-14 DIAGNOSIS — Z720 Tobacco use: Secondary | ICD-10-CM | POA: Diagnosis present

## 2013-03-14 DIAGNOSIS — F1021 Alcohol dependence, in remission: Secondary | ICD-10-CM | POA: Diagnosis present

## 2013-03-14 HISTORY — DX: Alcoholic cardiomyopathy: I42.6

## 2013-03-14 LAB — CBC WITH DIFFERENTIAL/PLATELET
HCT: 44.6 % (ref 39.0–52.0)
Hemoglobin: 14.8 g/dL (ref 13.0–17.0)
Lymphocytes Relative: 22 % (ref 12–46)
Lymphs Abs: 1.5 10*3/uL (ref 0.7–4.0)
Monocytes Absolute: 0.2 10*3/uL (ref 0.1–1.0)
Monocytes Relative: 3 % (ref 3–12)
Neutro Abs: 5.3 10*3/uL (ref 1.7–7.7)
Neutrophils Relative %: 75 % (ref 43–77)
RBC: 4.97 MIL/uL (ref 4.22–5.81)
WBC: 7.1 10*3/uL (ref 4.0–10.5)

## 2013-03-14 LAB — COMPREHENSIVE METABOLIC PANEL
ALT: 12 U/L (ref 0–53)
BUN: 15 mg/dL (ref 6–23)
Calcium: 8.9 mg/dL (ref 8.4–10.5)
Creatinine, Ser: 0.77 mg/dL (ref 0.50–1.35)
GFR calc Af Amer: >90 mL/min (ref >90)
GFR calc non Af Amer: >90 mL/min (ref >90)
Glucose, Bld: 114 mg/dL — ABNORMAL HIGH (ref 70–99)
Sodium: 141 mEq/L (ref 135–145)
Total Protein: 7.7 g/dL (ref 6.0–8.3)

## 2013-03-14 MED ORDER — HYDROMORPHONE HCL PF 1 MG/ML IJ SOLN
1.0000 mg | Freq: Once | INTRAMUSCULAR | Status: AC
  Administered 2013-03-14: 1 mg via INTRAVENOUS
  Filled 2013-03-14: qty 1

## 2013-03-14 MED ORDER — HYDROMORPHONE HCL PF 1 MG/ML IJ SOLN
0.5000 mg | INTRAMUSCULAR | Status: DC | PRN
  Administered 2013-03-15 – 2013-03-16 (×4): 0.5 mg via INTRAVENOUS
  Filled 2013-03-14 (×4): qty 1

## 2013-03-14 MED ORDER — LORAZEPAM 1 MG PO TABS
0.0000 mg | ORAL_TABLET | Freq: Four times a day (QID) | ORAL | Status: AC
  Administered 2013-03-15 (×2): 1 mg via ORAL
  Filled 2013-03-14 (×3): qty 1

## 2013-03-14 MED ORDER — ONDANSETRON HCL 4 MG/2ML IJ SOLN
4.0000 mg | Freq: Once | INTRAMUSCULAR | Status: AC
  Administered 2013-03-14: 4 mg via INTRAVENOUS
  Filled 2013-03-14: qty 2

## 2013-03-14 MED ORDER — FOLIC ACID 1 MG PO TABS
1.0000 mg | ORAL_TABLET | Freq: Every day | ORAL | Status: DC
  Administered 2013-03-15 – 2013-03-25 (×11): 1 mg via ORAL
  Filled 2013-03-14 (×12): qty 1

## 2013-03-14 MED ORDER — ADULT MULTIVITAMIN W/MINERALS CH
1.0000 | ORAL_TABLET | Freq: Every day | ORAL | Status: DC
  Administered 2013-03-15 – 2013-03-25 (×11): 1 via ORAL
  Filled 2013-03-14 (×12): qty 1

## 2013-03-14 MED ORDER — LORAZEPAM 2 MG/ML IJ SOLN
1.0000 mg | Freq: Four times a day (QID) | INTRAMUSCULAR | Status: AC | PRN
  Administered 2013-03-17: 1 mg via INTRAVENOUS
  Filled 2013-03-14: qty 1

## 2013-03-14 MED ORDER — PANTOPRAZOLE SODIUM 40 MG IV SOLR
40.0000 mg | INTRAVENOUS | Status: DC
  Administered 2013-03-14 – 2013-03-15 (×2): 40 mg via INTRAVENOUS
  Filled 2013-03-14 (×3): qty 40

## 2013-03-14 MED ORDER — ONDANSETRON HCL 4 MG/2ML IJ SOLN: 4.0000 mg | Freq: Three times a day (TID) | INTRAMUSCULAR | Status: DC | PRN

## 2013-03-14 MED ORDER — HYDROMORPHONE HCL PF 1 MG/ML IJ SOLN
1.0000 mg | INTRAMUSCULAR | Status: DC | PRN
  Administered 2013-03-14: 1 mg via INTRAVENOUS
  Filled 2013-03-14: qty 1

## 2013-03-14 MED ORDER — LORAZEPAM 1 MG PO TABS
1.0000 mg | ORAL_TABLET | Freq: Four times a day (QID) | ORAL | Status: AC | PRN
  Administered 2013-03-15 – 2013-03-17 (×2): 1 mg via ORAL
  Filled 2013-03-14: qty 1

## 2013-03-14 MED ORDER — SENNA 8.6 MG PO TABS
1.0000 | ORAL_TABLET | Freq: Two times a day (BID) | ORAL | Status: DC
  Administered 2013-03-15 – 2013-03-25 (×18): 8.6 mg via ORAL
  Filled 2013-03-14 (×23): qty 1

## 2013-03-14 MED ORDER — LORAZEPAM 1 MG PO TABS
0.0000 mg | ORAL_TABLET | Freq: Two times a day (BID) | ORAL | Status: AC
  Administered 2013-03-17: 4 mg via ORAL
  Administered 2013-03-17: 2 mg via ORAL
  Filled 2013-03-14 (×2): qty 4

## 2013-03-14 MED ORDER — LORAZEPAM 2 MG/ML IJ SOLN
1.0000 mg | Freq: Once | INTRAMUSCULAR | Status: AC
  Administered 2013-03-14: 1 mg via INTRAVENOUS
  Filled 2013-03-14: qty 1

## 2013-03-14 MED ORDER — ENOXAPARIN SODIUM 40 MG/0.4ML ~~LOC~~ SOLN
40.0000 mg | Freq: Every day | SUBCUTANEOUS | Status: DC
  Administered 2013-03-14 – 2013-03-24 (×11): 40 mg via SUBCUTANEOUS
  Filled 2013-03-14 (×13): qty 0.4

## 2013-03-14 MED ORDER — THIAMINE HCL 100 MG/ML IJ SOLN
100.0000 mg | Freq: Every day | INTRAMUSCULAR | Status: DC
  Administered 2013-03-14: 100 mg via INTRAVENOUS
  Administered 2013-03-15: 1 mg via INTRAVENOUS
  Filled 2013-03-14 (×3): qty 1

## 2013-03-14 MED ORDER — SODIUM CHLORIDE 0.9 % IV SOLN: INTRAVENOUS | Status: DC

## 2013-03-14 MED ORDER — SODIUM CHLORIDE 0.9 % IV SOLN
INTRAVENOUS | Status: DC
  Administered 2013-03-14: 23:00:00 via INTRAVENOUS
  Administered 2013-03-14: 1000 mL via INTRAVENOUS
  Administered 2013-03-15 (×2): via INTRAVENOUS

## 2013-03-14 MED ORDER — SODIUM CHLORIDE 0.9 % IV BOLUS (SEPSIS)
1000.0000 mL | Freq: Once | INTRAVENOUS | Status: AC
  Administered 2013-03-14: 1000 mL via INTRAVENOUS

## 2013-03-14 MED ORDER — OXYCODONE-ACETAMINOPHEN 5-325 MG PO TABS
1.0000 | ORAL_TABLET | ORAL | Status: DC | PRN
  Administered 2013-03-15 – 2013-03-21 (×30): 2 via ORAL
  Filled 2013-03-14 (×30): qty 2

## 2013-03-14 NOTE — ED Provider Notes (Addendum)
And a CSN: [id number]     Arrival date & time 03/14/13  1607 History   First MD Initiated Contact with Patient 03/14/13 1631     Chief Complaint  Patient presents with  . Fall   (Consider location/radiation/quality/duration/timing/severity/associated sxs/prior Treatment) Patient is a 57 y.o. male presenting with fall. The history is provided by the patient.  Fall Associated symptoms include headaches. Pertinent negatives include no chest pain, no abdominal pain and no shortness of breath.   patient was drinking this morning fell backwards patient hitting his back on the ground. Complaining of buttocks and low back pain. Also complaining of right knee pain that is chronic in nature but is worse now. Patient's friends took him initially to [facility] was unable to get out of the car he was brought here by EMS. Patient doesn't drinking alcohol today. No loss of consciousness.  Past Medical History  Diagnosis Date  . Systolic heart failure     EF 20-25% by echo 12/28/10  . Nonischemic cardiomyopathy     minimal coronary disease by cath 12/29/10  . Hemoptysis     secondary to pulmonary edema  . Hyperlipidemia   . Chronic anemia   . GI bleed     15 years ago  . Tobacco abuse   . Alcohol abuse   . Osteoarthritis   . Gout   . Diabetes mellitus, type 2     pt reports his DM is gone   Past Surgical History  Procedure Laterality Date  . Cardiac catheterization     Family History  Problem Relation Age of Onset  . Diabetes Mother   . Hypertension Mother   . Diabetes Father   . Hypertension Father    History  Substance Use Topics  . Smoking status: Current Every Day Smoker -- 4.00 packs/day    Types: Cigarettes  . Smokeless tobacco: Not on file  . Alcohol Use: Yes     Comment: occasionally    Review of Systems  Constitutional: Negative for fever.  HENT: Negative for congestion.   Eyes: Negative for redness.  Respiratory: Negative for shortness of breath.   Cardiovascular:  Negative for chest pain.  Gastrointestinal: Negative for abdominal pain.  Genitourinary: Negative for dysuria and hematuria.  Musculoskeletal: Positive for back pain. Negative for neck pain.  Skin: Negative for rash.  Neurological: Positive for headaches. Negative for weakness and numbness.  Hematological: Does not bruise/bleed easily.  Psychiatric/Behavioral: Negative for confusion.    Allergies  Review of patient's allergies indicates no known allergies.  Home Medications   Current Outpatient Rx  Name  Route  Sig  Dispense  Refill  . aspirin 325 MG tablet   Oral   Take 325 mg by mouth daily.         . Aspirin-Caffeine (BAYER BACK & BODY PAIN EX ST) 500-32.5 MG TABS   Oral   Take 1 tablet by mouth every 6 (six) hours as needed (pain).         . colchicine 0.6 MG tablet   Oral   Take 1 tablet (0.6 mg total) by mouth daily.   7 tablet   0   . ibuprofen (ADVIL,MOTRIN) 400 MG tablet   Oral   Take 1 tablet (400 mg total) by mouth every 6 (six) hours as needed for pain.   30 tablet   0   . indomethacin (INDOCIN) 25 MG capsule   Oral   Take 2 capsules (50 mg total) by mouth 3 (three)  times daily as needed.   15 capsule   0   . oxyCODONE-acetaminophen (PERCOCET/ROXICET) 5-325 MG per tablet   Oral   Take 1-2 tablets by mouth every 6 (six) hours as needed for severe pain.   15 tablet   0    BP 161/95  Pulse 122  Temp(Src) 97.9 F (36.6 C) (Oral)  Resp 16  SpO2 96% Physical Exam  Nursing note and vitals reviewed. Constitutional: He is oriented to person, place, and time. He appears well-developed and well-nourished. No distress.  HENT:  Head: Normocephalic and atraumatic.  Mouth/Throat: Oropharynx is clear and moist.  Eyes: Conjunctivae and EOM are normal. Pupils are equal, round, and reactive to light.  Neck: Normal range of motion.  Cardiovascular: Normal rate, regular rhythm and intact distal pulses.   No murmur heard. Pulmonary/Chest: Effort normal and  breath sounds normal. No respiratory distress.  Abdominal: Soft. Bowel sounds are normal. He exhibits no distension.  Musculoskeletal: Normal range of motion. He exhibits tenderness.  Patient with tenderness to the lumbar area tenderness to the right hip buttocks area. Also is a swelling. Chronic to the right knee no erythema no increased warmth. Dorsalis pedis pulse to the right foot is 1+. Good cap refill.  Neurological: He is alert and oriented to person, place, and time. No cranial nerve deficit. He exhibits normal muscle tone. Coordination normal.  Skin: Skin is warm. No rash noted.    ED Course  Procedures (including critical care time) Labs Review Labs Reviewed  GLUCOSE, CAPILLARY - Abnormal; Notable for the following:    Glucose-Capillary 121 (*)    All other components within normal limits  COMPREHENSIVE METABOLIC PANEL - Abnormal; Notable for the following:    Glucose, Bld 114 (*)    Total Bilirubin 1.4 (*)    All other components within normal limits  CBC WITH DIFFERENTIAL - Abnormal; Notable for the following:    RDW 19.6 (*)    Platelets 126 (*)    All other components within normal limits  ETHANOL - Abnormal; Notable for the following:    Alcohol, Ethyl (B) 332 (*)    All other components within normal limits   Results for orders placed during the hospital encounter of 03/14/13  GLUCOSE, CAPILLARY      Result Value Range   Glucose-Capillary 121 (*) 70 - 99 mg/dL  COMPREHENSIVE METABOLIC PANEL      Result Value Range   Sodium 141  135 - 145 mEq/L   Potassium 3.7  3.5 - 5.1 mEq/L   Chloride 101  96 - 112 mEq/L   CO2 25  19 - 32 mEq/L   Glucose, Bld 114 (*) 70 - 99 mg/dL   BUN 15  6 - 23 mg/dL   Creatinine, Ser 0.77  0.50 - 1.35 mg/dL   Calcium 8.9  8.4 - 10.5 mg/dL   Total Protein 7.7  6.0 - 8.3 g/dL   Albumin 4.1  3.5 - 5.2 g/dL   AST 24  0 - 37 U/L   ALT 12  0 - 53 U/L   Alkaline Phosphatase 113  39 - 117 U/L   Total Bilirubin 1.4 (*) 0.3 - 1.2 mg/dL   GFR  calc non Af Amer >90  >90 mL/min   GFR calc Af Amer >90  >90 mL/min  CBC WITH DIFFERENTIAL      Result Value Range   WBC 7.1  4.0 - 10.5 K/uL   RBC 4.97  4.22 - 5.81 MIL/uL  Hemoglobin 14.8  13.0 - 17.0 g/dL   HCT 44.6  39.0 - 52.0 %   MCV 89.7  78.0 - 100.0 fL   MCH 29.8  26.0 - 34.0 pg   MCHC 33.2  30.0 - 36.0 g/dL   RDW 19.6 (*) 11.5 - 15.5 %   Platelets 126 (*) 150 - 400 K/uL   Neutrophils Relative % 75  43 - 77 %   Neutro Abs 5.3  1.7 - 7.7 K/uL   Lymphocytes Relative 22  12 - 46 %   Lymphs Abs 1.5  0.7 - 4.0 K/uL   Monocytes Relative 3  3 - 12 %   Monocytes Absolute 0.2  0.1 - 1.0 K/uL   Eosinophils Relative 1  0 - 5 %   Eosinophils Absolute 0.1  0.0 - 0.7 K/uL   Basophils Relative 0  0 - 1 %   Basophils Absolute 0.0  0.0 - 0.1 K/uL  ETHANOL      Result Value Range   Alcohol, Ethyl (B) 332 (*) 0 - 11 mg/dL    Imaging Review Dg Chest 1 View  03/14/2013   CLINICAL DATA:  Fall  EXAM: CHEST - 1 VIEW  COMPARISON:  05/29/2012  FINDINGS: Lungs are essentially clear. No focal consolidation. No pleural effusion or pneumothorax.  The heart is normal in size.  Degenerative changes of the right shoulder.  IMPRESSION: No evidence of acute cardiopulmonary disease.   Electronically Signed   By: Julian Hy M.D.   On: 03/14/2013 18:29   Dg Lumbar Spine 2-3 Views  03/14/2013   CLINICAL DATA:  Fall, low back pain  EXAM: LUMBAR SPINE - 2-3 VIEW  COMPARISON:  None.  FINDINGS: Five lumbar type vertebral bodies.  Straightening of the lumbar spine.  No evidence of fracture dislocation. Vertebral body heights are maintained.  Moderate degenerative changes of the lower lumbar spine.  Visualized bony pelvis appears intact.  IMPRESSION: No fracture or dislocation is seen.   Electronically Signed   By: Julian Hy M.D.   On: 03/14/2013 18:27   Dg Hip Bilateral W/pelvis  03/14/2013   CLINICAL DATA:  Fall, right hip pain  EXAM: BILATERAL HIP WITH PELVIS - 4+ VIEW  COMPARISON:  None.   FINDINGS: Subcapital right hip fracture with foreshortening and varus angulation.  Left hip is intact.  Visualized bony pelvis is unremarkable.  Mild degenerative changes of the lower lumbar spine.  IMPRESSION: Subcapital right hip fracture, as described above.   Electronically Signed   By: Julian Hy M.D.   On: 03/14/2013 18:27   Ct Head Wo Contrast  03/14/2013   CLINICAL DATA:  56 year old male with fall, headache and neck pain.  EXAM: CT HEAD WITHOUT CONTRAST  CT CERVICAL SPINE WITHOUT CONTRAST  TECHNIQUE: Multidetector CT imaging of the head and cervical spine was performed following the standard protocol without intravenous contrast. Multiplanar CT image reconstructions of the cervical spine were also generated.  COMPARISON:  None.  FINDINGS: CT HEAD FINDINGS  Mild generalized cerebral volume loss noted.  No acute intracranial abnormalities are identified, including mass lesion or mass effect, hydrocephalus, extra-axial fluid collection, midline shift, hemorrhage, or acute infarction. The visualized bony calvarium is unremarkable.  CT CERVICAL SPINE FINDINGS  Normal alignment is noted.  There is no evidence of acute fracture, subluxation or prevertebral soft tissue swelling.  Moderate degenerative disc disease/spondylosis is identified from C4-C7. With multilevel facet arthropathy, mild to moderate central spinal and biforaminal narrowing is identified from C4-C7.  No focal bony lesions are identified.  IMPRESSION: No evidence of acute intracranial abnormality or static evidence of acute injury to the cervical spine.  Moderate degenerative changes from C4-C7 with mild-to-moderate central spinal and foraminal narrowing.   Electronically Signed   By: Hassan Rowan M.D.   On: 03/14/2013 18:52   Ct Cervical Spine Wo Contrast  03/14/2013   CLINICAL DATA:  57 year old male with fall, headache and neck pain.  EXAM: CT HEAD WITHOUT CONTRAST  CT CERVICAL SPINE WITHOUT CONTRAST  TECHNIQUE: Multidetector CT  imaging of the head and cervical spine was performed following the standard protocol without intravenous contrast. Multiplanar CT image reconstructions of the cervical spine were also generated.  COMPARISON:  None.  FINDINGS: CT HEAD FINDINGS  Mild generalized cerebral volume loss noted.  No acute intracranial abnormalities are identified, including mass lesion or mass effect, hydrocephalus, extra-axial fluid collection, midline shift, hemorrhage, or acute infarction. The visualized bony calvarium is unremarkable.  CT CERVICAL SPINE FINDINGS  Normal alignment is noted.  There is no evidence of acute fracture, subluxation or prevertebral soft tissue swelling.  Moderate degenerative disc disease/spondylosis is identified from C4-C7. With multilevel facet arthropathy, mild to moderate central spinal and biforaminal narrowing is identified from C4-C7.  No focal bony lesions are identified.  IMPRESSION: No evidence of acute intracranial abnormality or static evidence of acute injury to the cervical spine.  Moderate degenerative changes from C4-C7 with mild-to-moderate central spinal and foraminal narrowing.   Electronically Signed   By: Hassan Rowan M.D.   On: 03/14/2013 18:52   Dg Knee Complete 4 Views Right  03/14/2013   CLINICAL DATA:  Fall, right knee pain  EXAM: RIGHT KNEE - COMPLETE 4+ VIEW  COMPARISON:  05/29/2012  FINDINGS: No fracture or dislocation is seen.  Moderate tricompartmental degenerative changes.  Small suprapatellar knee joint effusion.  IMPRESSION: No fracture or dislocation is seen.  Moderate degenerative changes. Small suprapatellar knee joint effusion.   Electronically Signed   By: Julian Hy M.D.   On: 03/14/2013 18:28    EKG Interpretation    Date/Time:  Saturday March 14 2013 20:35:55 EST Ventricular Rate:  116 PR Interval:  162 QRS Duration: 107 QT Interval:  348 QTC Calculation: 483 R Axis:   -23 Text Interpretation:  Sinus tachycardia Probable left atrial enlargement  Left ventricular hypertrophy Anterior infarct, old Nonspecific T abnormalities, inferior leads Baseline wander in lead(s) V5 No significant change since No significant change since last tracing Confirmed by Macee Venables  MD, Larue Lightner ([id number]) on 03/14/2013 8:39:52 PM            MDM   1. Hip fracture requiring operative repair, right, closed, initial encounter   2. Alcohol intoxication    Patient with a history of alcohol abuse. Was drinking today he fell over. Significant injury includes just his right hip that has is fracture. Discuss with orthopedics. Patient also intoxicated from alcohol. Rest of workup including head CT x-rays of the lumbar back and his right knee were negative he was complaining of pain in those areas. Also x-ray of cervical spine was negative. Liver function tests without significant abnormalities. Orthopedics will plan on hip repair of tomorrow will not occur tonight medicine to admit.  Patient's chest x-ray without any evidence of cardiopulmonary disease.    Mervin Kung, MD 03/14/13 2019 in  Mervin Kung, MD 03/18/13 [phone]

## 2013-03-14 NOTE — H&P (Signed)
[facility] History and Physical  Patient: Travis [name]  [id number]  DOB: Jun 04, 1955  DOS: the patient was seen and examined on 03/14/2013 PCP: Glori Bickers, MD  Chief Complaint: Fall  HPI: Travis [name] is a 57 y.o. male with Past medical history of chronic systolic heart failure due to nonischemic cardiomyopathy with EF of 20-25%, dyslipidemia, GI bleed, history of diabetes mellitus, alcohol abuse. The patient is coming from home The patient appears to be poor historian due to influence of alcohol and the family members were present does not know the history therefore the history has been obtained from the ED physician as well as EMS documentation. Abdomen the patient has been drinking since this morning unknown amount of beer, which patient mentions he was drinking "2x 40"and then he was out with his friends walking while he fell on the back, it is unclear whether it was a mechanical fall, patient mentions he slipped down. Pt denies any fever, chills, headache, cough, chest pain, palpitation, shortness of breath, orthopnea, PND, nausea, vomiting, abdominal pain, diarrhea, constipation, active bleeding, burning urination, dizziness, pedal edema,  focal neurological deficit.   Review of Systems: as mentioned in the history of present illness.  A Comprehensive review of the other systems is negative.  Past Medical History  Diagnosis Date  . Systolic heart failure     EF 20-25% by echo 12/28/10  . Nonischemic cardiomyopathy     minimal coronary disease by cath 12/29/10  . Hemoptysis     secondary to pulmonary edema  . Hyperlipidemia   . Chronic anemia   . GI bleed     15 years ago  . Tobacco abuse   . Alcohol abuse   . Osteoarthritis   . Gout   . Diabetes mellitus, type 2     pt reports his DM is gone   Past Surgical History  Procedure Laterality Date  . Cardiac catheterization     Social History:  reports that he has been smoking Cigarettes.  He has been smoking  about 4.00 packs per day. He does not have any smokeless tobacco history on file. He reports that he drinks alcohol. He reports that he does not use illicit drugs. Independent for most of his  ADL.  No Known Allergies  Family History  Problem Relation Age of Onset  . Diabetes Mother   . Hypertension Mother   . Diabetes Father   . Hypertension Father     Prior to Admission medications   Medication Sig Start Date End Date Taking? Authorizing Provider  aspirin 325 MG tablet Take 325 mg by mouth daily.    Historical Provider, MD  Aspirin-Caffeine (BAYER BACK & BODY PAIN EX ST) 500-32.5 MG TABS Take 1 tablet by mouth every 6 (six) hours as needed (pain).    Historical Provider, MD  colchicine 0.6 MG tablet Take 1 tablet (0.6 mg total) by mouth daily. 02/09/13   Heather Laisure, PA-C  ibuprofen (ADVIL,MOTRIN) 400 MG tablet Take 1 tablet (400 mg total) by mouth every 6 (six) hours as needed for pain. 09/13/12   Varney Biles, MD  indomethacin (INDOCIN) 25 MG capsule Take 2 capsules (50 mg total) by mouth 3 (three) times daily as needed. 02/09/13   Hyman Bible, PA-C  oxyCODONE-acetaminophen (PERCOCET/ROXICET) 5-325 MG per tablet Take 1-2 tablets by mouth every 6 (six) hours as needed for severe pain. 02/09/13   Hyman Bible, PA-C    Physical Exam: Filed Vitals:   03/14/13 1837 03/14/13 1915 03/14/13 1945 03/14/13  2015  BP: 161/97 153/93 168/99 161/95  Pulse:  111 118 122  Temp:      TempSrc:      Resp: 16     SpO2: 100% 96% 96% 96%    General: Lethargy, Awake and Oriented to Time, Place and Person. Appear in moderate distress Eyes: PERRL ENT: Oral Mucosa clear dry. Neck: No JVD Cardiovascular: S1 and S2 Present, no Murmur, Peripheral Pulses Present Respiratory: Bilateral Air entry equal and Decreased, Clear to Auscultation,  No Crackles, no wheezes Abdomen: Bowel Sound Present, Soft and Non tender Skin: No Rash Extremities: No Pedal edema, no calf tenderness, right leg  rotated externally Neurologic: Grossly Unremarkable.  Labs on Admission:  CBC:  Recent Labs Lab 03/14/13 1729  WBC 7.1  NEUTROABS 5.3  HGB 14.8  HCT 44.6  MCV 89.7  PLT 126*    CMP     Component Value Date/Time   NA 141 03/14/2013 1729   NA 145* 02/25/2012 1409   K 3.7 03/14/2013 1729   CL 101 03/14/2013 1729   CO2 25 03/14/2013 1729   GLUCOSE 114* 03/14/2013 1729   GLUCOSE 87 02/25/2012 1409   BUN 15 03/14/2013 1729   BUN 11 02/25/2012 1409   CREATININE 0.77 03/14/2013 1729   CALCIUM 8.9 03/14/2013 1729   PROT 7.7 03/14/2013 1729   ALBUMIN 4.1 03/14/2013 1729   AST 24 03/14/2013 1729   ALT 12 03/14/2013 1729   ALKPHOS 113 03/14/2013 1729   BILITOT 1.4* 03/14/2013 1729   GFRNONAA >90 03/14/2013 1729   GFRAA >90 03/14/2013 1729    No results found for this basename: LIPASE, AMYLASE,  in the last 168 hours No results found for this basename: AMMONIA,  in the last 168 hours  No results found for this basename: CKTOTAL, CKMB, CKMBINDEX, TROPONINI,  in the last 168 hours BNP (last 3 results) No results found for this basename: PROBNP,  in the last 8760 hours  Radiological Exams on Admission: Dg Chest 1 View  03/14/2013   CLINICAL DATA:  Fall  EXAM: CHEST - 1 VIEW  COMPARISON:  05/29/2012  FINDINGS: Lungs are essentially clear. No focal consolidation. No pleural effusion or pneumothorax.  The heart is normal in size.  Degenerative changes of the right shoulder.  IMPRESSION: No evidence of acute cardiopulmonary disease.   Electronically Signed   By: Julian Hy M.D.   On: 03/14/2013 18:29   Dg Lumbar Spine 2-3 Views  03/14/2013   CLINICAL DATA:  Fall, low back pain  EXAM: LUMBAR SPINE - 2-3 VIEW  COMPARISON:  None.  FINDINGS: Five lumbar type vertebral bodies.  Straightening of the lumbar spine.  No evidence of fracture dislocation. Vertebral body heights are maintained.  Moderate degenerative changes of the lower lumbar spine.  Visualized bony pelvis appears  intact.  IMPRESSION: No fracture or dislocation is seen.   Electronically Signed   By: Julian Hy M.D.   On: 03/14/2013 18:27   Dg Hip Bilateral W/pelvis  03/14/2013   CLINICAL DATA:  Fall, right hip pain  EXAM: BILATERAL HIP WITH PELVIS - 4+ VIEW  COMPARISON:  None.  FINDINGS: Subcapital right hip fracture with foreshortening and varus angulation.  Left hip is intact.  Visualized bony pelvis is unremarkable.  Mild degenerative changes of the lower lumbar spine.  IMPRESSION: Subcapital right hip fracture, as described above.   Electronically Signed   By: Julian Hy M.D.   On: 03/14/2013 18:27   Ct Head Wo Contrast  03/14/2013  CLINICAL DATA:  57 year old male with fall, headache and neck pain.  EXAM: CT HEAD WITHOUT CONTRAST  CT CERVICAL SPINE WITHOUT CONTRAST  TECHNIQUE: Multidetector CT imaging of the head and cervical spine was performed following the standard protocol without intravenous contrast. Multiplanar CT image reconstructions of the cervical spine were also generated.  COMPARISON:  None.  FINDINGS: CT HEAD FINDINGS  Mild generalized cerebral volume loss noted.  No acute intracranial abnormalities are identified, including mass lesion or mass effect, hydrocephalus, extra-axial fluid collection, midline shift, hemorrhage, or acute infarction. The visualized bony calvarium is unremarkable.  CT CERVICAL SPINE FINDINGS  Normal alignment is noted.  There is no evidence of acute fracture, subluxation or prevertebral soft tissue swelling.  Moderate degenerative disc disease/spondylosis is identified from C4-C7. With multilevel facet arthropathy, mild to moderate central spinal and biforaminal narrowing is identified from C4-C7.  No focal bony lesions are identified.  IMPRESSION: No evidence of acute intracranial abnormality or static evidence of acute injury to the cervical spine.  Moderate degenerative changes from C4-C7 with mild-to-moderate central spinal and foraminal narrowing.    Electronically Signed   By: Hassan Rowan M.D.   On: 03/14/2013 18:52   Ct Cervical Spine Wo Contrast  03/14/2013   CLINICAL DATA:  57 year old male with fall, headache and neck pain.  EXAM: CT HEAD WITHOUT CONTRAST  CT CERVICAL SPINE WITHOUT CONTRAST  TECHNIQUE: Multidetector CT imaging of the head and cervical spine was performed following the standard protocol without intravenous contrast. Multiplanar CT image reconstructions of the cervical spine were also generated.  COMPARISON:  None.  FINDINGS: CT HEAD FINDINGS  Mild generalized cerebral volume loss noted.  No acute intracranial abnormalities are identified, including mass lesion or mass effect, hydrocephalus, extra-axial fluid collection, midline shift, hemorrhage, or acute infarction. The visualized bony calvarium is unremarkable.  CT CERVICAL SPINE FINDINGS  Normal alignment is noted.  There is no evidence of acute fracture, subluxation or prevertebral soft tissue swelling.  Moderate degenerative disc disease/spondylosis is identified from C4-C7. With multilevel facet arthropathy, mild to moderate central spinal and biforaminal narrowing is identified from C4-C7.  No focal bony lesions are identified.  IMPRESSION: No evidence of acute intracranial abnormality or static evidence of acute injury to the cervical spine.  Moderate degenerative changes from C4-C7 with mild-to-moderate central spinal and foraminal narrowing.   Electronically Signed   By: Hassan Rowan M.D.   On: 03/14/2013 18:52   Dg Knee Complete 4 Views Right  03/14/2013   CLINICAL DATA:  Fall, right knee pain  EXAM: RIGHT KNEE - COMPLETE 4+ VIEW  COMPARISON:  05/29/2012  FINDINGS: No fracture or dislocation is seen.  Moderate tricompartmental degenerative changes.  Small suprapatellar knee joint effusion.  IMPRESSION: No fracture or dislocation is seen.  Moderate degenerative changes. Small suprapatellar knee joint effusion.   Electronically Signed   By: Julian Hy M.D.   On: 03/14/2013  18:28    EKG: Independently reviewed. nonspecific ST and T waves changes, sinus tachycardia.  Assessment/Plan Principal Problem:   Closed right hip fracture Active Problems:   Chronic systolic heart failure   Smoker   Alcohol abuse   1. Closed right hip fracture The patient is presenting with complaints of fall which he mentions as a mechanical fall. This happened after significant alcohol drinking. His current alcohol level is 300+. With this his x-ray is suggestive of closed right hip fracture. At present he would be admitted to telemetry. Orthopedics has been consulted who will perform surgery  pending medical clearance tomorrow. Pain controlled with IV narcotics, but doses will be monitored in view of his alcohol intake His rest of the imaging including CT chest does not show any acute abnormality  2. Chronic systolic heart failure Patient is low ejection fraction but at present does not appear volume overloaded I will avoid IV fluid infusion unless needed Continue to monitor  3. Alcohol abuse Patient's current EtOH level is 300+. He will be placed on protocol CIWA IV thiamine and multivitamins  4. GERD   Protonix  Consults: Orthopedics   DVT Prophylaxis: subcutaneous Heparin Nutrition: N.p.o.   Code Status: Full   Family Communication: Family was present at bedside, opportunity was given to ask question and all questions were answered satisfactorily at the time of interview. Disposition: Admitted to inpatient in telemetry unit.  Author: Berle Mull, MD [facility] Pager: [phone] 03/14/2013, 8:55 PM    If 7PM-7AM, please contact night-coverage [email or web address] Password [email or web address]

## 2013-03-14 NOTE — ED Notes (Signed)
Admitting Md at bedside

## 2013-03-14 NOTE — Progress Notes (Signed)
Patient ID: Travis [name], male   DOB: 06-19-1955, 57 y.o.   MRN: [id number] I have reviewed the patient's x-ray and spoken with the ED attending.  A full note will follow later tonight or in the am.  He will need fixation of his hip with a hemiarthroplasty.  Given the high alcohol level in his blood combined with his cardiac history, surgery will need to be delayed pending medical and possibly cardiac clearance.  I will plan on surgery tomorrow 12/21 unless otherwise noted.  From my stanpoint he can eat tonight and should be NPO after midnight.

## 2013-03-14 NOTE — ED Notes (Signed)
Per EMS, pt was out in [place] with his friends and fell backwards. He fell on his back and is complaining of his "butt bone hurting" and right knee pain. However, right knee always hurts due to fluid on his knee. Pt's friends took him home to [place], but was unable to get out of the car bc of the pain. EMS arrived and brought pt to ED. BP 150/100, HR 100. Pt states he drank "a couple 40's of beer"

## 2013-03-15 ENCOUNTER — Encounter (HOSPITAL_COMMUNITY)
Admission: EM | Disposition: A | Discharge status: Home Health | Payer: Self-pay | Source: Home / Self Care | Attending: Internal Medicine

## 2013-03-15 ENCOUNTER — Inpatient Hospital Stay (HOSPITAL_COMMUNITY): Payer: Medicaid Other

## 2013-03-15 ENCOUNTER — Encounter (HOSPITAL_COMMUNITY): Payer: Self-pay | Admitting: Critical Care Medicine

## 2013-03-15 ENCOUNTER — Inpatient Hospital Stay (HOSPITAL_COMMUNITY): Payer: Medicaid Other | Admitting: Critical Care Medicine

## 2013-03-15 ENCOUNTER — Encounter (HOSPITAL_COMMUNITY): Payer: Medicaid Other | Admitting: Critical Care Medicine

## 2013-03-15 DIAGNOSIS — I426 Alcoholic cardiomyopathy: Secondary | ICD-10-CM

## 2013-03-15 DIAGNOSIS — I428 Other cardiomyopathies: Secondary | ICD-10-CM | POA: Diagnosis present

## 2013-03-15 DIAGNOSIS — Z01810 Encounter for preprocedural cardiovascular examination: Secondary | ICD-10-CM

## 2013-03-15 HISTORY — DX: Alcoholic cardiomyopathy: I42.6

## 2013-03-15 HISTORY — PX: HIP ARTHROPLASTY: SHX981

## 2013-03-15 LAB — CBC WITH DIFFERENTIAL/PLATELET
Basophils Absolute: 0 10*3/uL (ref 0.0–0.1)
Basophils Relative: 0 % (ref 0–1)
Eosinophils Absolute: 0.2 10*3/uL (ref 0.0–0.7)
HCT: 39 % (ref 39.0–52.0)
Lymphocytes Relative: 34 % (ref 12–46)
MCH: 31.3 pg (ref 26.0–34.0)
MCHC: 33.3 g/dL (ref 30.0–36.0)
Monocytes Absolute: 0.3 10*3/uL (ref 0.1–1.0)
Monocytes Relative: 4 % (ref 3–12)
Neutro Abs: 4.6 10*3/uL (ref 1.7–7.7)
Neutrophils Relative %: 60 % (ref 43–77)
Platelets: 117 10*3/uL — ABNORMAL LOW (ref 150–400)
RBC: 4.16 MIL/uL — ABNORMAL LOW (ref 4.22–5.81)
RDW: 19.9 % — ABNORMAL HIGH (ref 11.5–15.5)
WBC: 7.7 10*3/uL (ref 4.0–10.5)

## 2013-03-15 LAB — ETHANOL: Alcohol, Ethyl (B): 142 mg/dL — ABNORMAL HIGH (ref 0–11)

## 2013-03-15 LAB — URINALYSIS, ROUTINE W REFLEX MICROSCOPIC
Bilirubin Urine: NEGATIVE
Glucose, UA: NEGATIVE mg/dL
Hgb urine dipstick: NEGATIVE
Ketones, ur: NEGATIVE mg/dL
Leukocytes, UA: NEGATIVE
Nitrite: NEGATIVE
Protein, ur: NEGATIVE mg/dL
Urobilinogen, UA: 0.2 mg/dL (ref 0.0–1.0)
pH: 5 (ref 5.0–8.0)

## 2013-03-15 LAB — COMPREHENSIVE METABOLIC PANEL
ALT: 10 U/L (ref 0–53)
AST: 20 U/L (ref 0–37)
CO2: 26 mEq/L (ref 19–32)
Chloride: 104 mEq/L (ref 96–112)
Creatinine, Ser: 0.84 mg/dL (ref 0.50–1.35)
GFR calc Af Amer: >90 mL/min (ref >90)
GFR calc non Af Amer: >90 mL/min (ref >90)
Glucose, Bld: 87 mg/dL (ref 70–99)
Sodium: 143 mEq/L (ref 135–145)
Total Bilirubin: 1.7 mg/dL — ABNORMAL HIGH (ref 0.3–1.2)

## 2013-03-15 LAB — GLUCOSE, CAPILLARY
Glucose-Capillary: 102 mg/dL — ABNORMAL HIGH (ref 70–99)
Glucose-Capillary: 114 mg/dL — ABNORMAL HIGH (ref 70–99)
Glucose-Capillary: 128 mg/dL — ABNORMAL HIGH (ref 70–99)

## 2013-03-15 LAB — TYPE AND SCREEN

## 2013-03-15 LAB — MRSA PCR SCREENING: MRSA by PCR: NEGATIVE

## 2013-03-15 LAB — PROTIME-INR: INR: 1.2 (ref 0.00–1.49)

## 2013-03-15 SURGERY — HEMIARTHROPLASTY, HIP, DIRECT ANTERIOR APPROACH, FOR FRACTURE
Anesthesia: General | Site: Hip | Laterality: Right

## 2013-03-15 MED ORDER — CEFAZOLIN SODIUM-DEXTROSE 2-3 GM-% IV SOLR
INTRAVENOUS | Status: AC
  Administered 2013-03-15: 2 g via INTRAVENOUS
  Filled 2013-03-15: qty 50

## 2013-03-15 MED ORDER — PROPOFOL 10 MG/ML IV BOLUS
INTRAVENOUS | Status: DC | PRN
  Administered 2013-03-15: 20 mg via INTRAVENOUS

## 2013-03-15 MED ORDER — METHOCARBAMOL 500 MG PO TABS
500.0000 mg | ORAL_TABLET | Freq: Four times a day (QID) | ORAL | Status: DC | PRN
  Administered 2013-03-15 – 2013-03-25 (×17): 500 mg via ORAL
  Filled 2013-03-15 (×17): qty 1

## 2013-03-15 MED ORDER — INSULIN ASPART 100 UNIT/ML ~~LOC~~ SOLN
0.0000 [IU] | Freq: Three times a day (TID) | SUBCUTANEOUS | Status: DC
  Administered 2013-03-16 – 2013-03-25 (×6): 1 [IU] via SUBCUTANEOUS

## 2013-03-15 MED ORDER — LISINOPRIL 5 MG PO TABS
5.0000 mg | ORAL_TABLET | Freq: Every day | ORAL | Status: DC
  Administered 2013-03-15 – 2013-03-25 (×11): 5 mg via ORAL
  Filled 2013-03-15 (×11): qty 1

## 2013-03-15 MED ORDER — MIDAZOLAM HCL 5 MG/5ML IJ SOLN
INTRAMUSCULAR | Status: DC | PRN
  Administered 2013-03-15: 1 mg via INTRAVENOUS
  Administered 2013-03-15: 2 mg via INTRAVENOUS

## 2013-03-15 MED ORDER — MEPERIDINE HCL 25 MG/ML IJ SOLN: 6.2500 mg | INTRAMUSCULAR | Status: DC | PRN

## 2013-03-15 MED ORDER — MENTHOL 3 MG MT LOZG: 1.0000 | LOZENGE | OROMUCOSAL | Status: DC | PRN

## 2013-03-15 MED ORDER — METOCLOPRAMIDE HCL 5 MG/ML IJ SOLN
5.0000 mg | Freq: Three times a day (TID) | INTRAMUSCULAR | Status: DC | PRN
Start: 2013-03-15 — End: 2013-03-25

## 2013-03-15 MED ORDER — INSULIN ASPART 100 UNIT/ML ~~LOC~~ SOLN: 0.0000 [IU] | Freq: Three times a day (TID) | SUBCUTANEOUS | Status: DC

## 2013-03-15 MED ORDER — SODIUM CHLORIDE 0.9 % IV SOLN: INTRAVENOUS | Status: DC

## 2013-03-15 MED ORDER — LIDOCAINE HCL (CARDIAC) 20 MG/ML IV SOLN
INTRAVENOUS | Status: DC | PRN
  Administered 2013-03-15: 20 mg via INTRAVENOUS

## 2013-03-15 MED ORDER — INSULIN ASPART 100 UNIT/ML ~~LOC~~ SOLN: 0.0000 [IU] | SUBCUTANEOUS | Status: DC

## 2013-03-15 MED ORDER — PROMETHAZINE HCL 25 MG/ML IJ SOLN: 6.2500 mg | INTRAMUSCULAR | Status: DC | PRN

## 2013-03-15 MED ORDER — CARVEDILOL 6.25 MG PO TABS
6.2500 mg | ORAL_TABLET | Freq: Two times a day (BID) | ORAL | Status: DC
  Filled 2013-03-15 (×2): qty 1

## 2013-03-15 MED ORDER — CEFAZOLIN SODIUM-DEXTROSE 2-3 GM-% IV SOLR
2.0000 g | Freq: Four times a day (QID) | INTRAVENOUS | Status: AC
Start: 2013-03-15 — End: 2013-03-16
  Administered 2013-03-15 – 2013-03-16 (×2): 2 g via INTRAVENOUS
  Filled 2013-03-15 (×2): qty 50

## 2013-03-15 MED ORDER — ONDANSETRON HCL 4 MG/2ML IJ SOLN: 4.0000 mg | Freq: Four times a day (QID) | INTRAMUSCULAR | Status: DC | PRN

## 2013-03-15 MED ORDER — PHENYLEPHRINE HCL 10 MG/ML IJ SOLN
10.0000 mg | INTRAVENOUS | Status: DC | PRN
  Administered 2013-03-15: 40 ug/min via INTRAVENOUS

## 2013-03-15 MED ORDER — ASPIRIN EC 325 MG PO TBEC
325.0000 mg | DELAYED_RELEASE_TABLET | Freq: Every day | ORAL | Status: DC
  Administered 2013-03-16 – 2013-03-25 (×10): 325 mg via ORAL
  Filled 2013-03-15 (×11): qty 1

## 2013-03-15 MED ORDER — LACTATED RINGERS IV SOLN
INTRAVENOUS | Status: DC | PRN
  Administered 2013-03-15 (×2): via INTRAVENOUS

## 2013-03-15 MED ORDER — METHOCARBAMOL 100 MG/ML IJ SOLN
500.0000 mg | Freq: Four times a day (QID) | INTRAVENOUS | Status: DC | PRN
  Filled 2013-03-15: qty 5

## 2013-03-15 MED ORDER — PHENOL 1.4 % MT LIQD: 1.0000 | OROMUCOSAL | Status: DC | PRN

## 2013-03-15 MED ORDER — DOCUSATE SODIUM 100 MG PO CAPS
100.0000 mg | ORAL_CAPSULE | Freq: Two times a day (BID) | ORAL | Status: DC
  Administered 2013-03-15 – 2013-03-25 (×19): 100 mg via ORAL
  Filled 2013-03-15 (×22): qty 1

## 2013-03-15 MED ORDER — ACETAMINOPHEN 325 MG PO TABS: 650.0000 mg | ORAL_TABLET | Freq: Four times a day (QID) | ORAL | Status: DC | PRN

## 2013-03-15 MED ORDER — FUROSEMIDE 40 MG PO TABS
40.0000 mg | ORAL_TABLET | Freq: Every day | ORAL | Status: DC
  Administered 2013-03-15 – 2013-03-16 (×2): 40 mg via ORAL
  Filled 2013-03-15 (×3): qty 1

## 2013-03-15 MED ORDER — POLYETHYLENE GLYCOL 3350 17 G PO PACK: 17.0000 g | PACK | Freq: Every day | ORAL | Status: DC | PRN

## 2013-03-15 MED ORDER — ONDANSETRON HCL 4 MG PO TABS: 4.0000 mg | ORAL_TABLET | Freq: Four times a day (QID) | ORAL | Status: DC | PRN

## 2013-03-15 MED ORDER — SPIRONOLACTONE 25 MG PO TABS
25.0000 mg | ORAL_TABLET | Freq: Every day | ORAL | Status: DC
  Administered 2013-03-15 – 2013-03-25 (×11): 25 mg via ORAL
  Filled 2013-03-15 (×12): qty 1

## 2013-03-15 MED ORDER — MIDAZOLAM HCL 2 MG/2ML IJ SOLN: 0.5000 mg | Freq: Once | INTRAMUSCULAR | Status: DC | PRN

## 2013-03-15 MED ORDER — HYDROCODONE-ACETAMINOPHEN 5-325 MG PO TABS
1.0000 | ORAL_TABLET | Freq: Four times a day (QID) | ORAL | Status: DC | PRN
  Administered 2013-03-16 – 2013-03-18 (×2): 2 via ORAL
  Filled 2013-03-15 (×2): qty 2

## 2013-03-15 MED ORDER — CARVEDILOL 6.25 MG PO TABS
6.2500 mg | ORAL_TABLET | Freq: Two times a day (BID) | ORAL | Status: DC
  Administered 2013-03-15 – 2013-03-25 (×20): 6.25 mg via ORAL
  Filled 2013-03-15 (×23): qty 1

## 2013-03-15 MED ORDER — PNEUMOCOCCAL VAC POLYVALENT 25 MCG/0.5ML IJ INJ
0.5000 mL | INJECTION | INTRAMUSCULAR | Status: AC
  Administered 2013-03-17: 0.5 mL via INTRAMUSCULAR
  Filled 2013-03-15: qty 0.5

## 2013-03-15 MED ORDER — OXYCODONE HCL 5 MG PO TABS: 5.0000 mg | ORAL_TABLET | Freq: Once | ORAL | Status: DC | PRN

## 2013-03-15 MED ORDER — INFLUENZA VAC SPLIT QUAD 0.5 ML IM SUSP
0.5000 mL | INTRAMUSCULAR | Status: AC
  Administered 2013-03-17: 0.5 mL via INTRAMUSCULAR
  Filled 2013-03-15: qty 0.5

## 2013-03-15 MED ORDER — METOCLOPRAMIDE HCL 10 MG PO TABS: 5.0000 mg | ORAL_TABLET | Freq: Three times a day (TID) | ORAL | Status: DC | PRN

## 2013-03-15 MED ORDER — MORPHINE SULFATE 2 MG/ML IJ SOLN: 0.5000 mg | INTRAMUSCULAR | Status: DC | PRN

## 2013-03-15 MED ORDER — DOCUSATE SODIUM 100 MG PO CAPS: 100.0000 mg | ORAL_CAPSULE | Freq: Two times a day (BID) | ORAL | Status: DC

## 2013-03-15 MED ORDER — SUCCINYLCHOLINE CHLORIDE 20 MG/ML IJ SOLN
INTRAMUSCULAR | Status: DC | PRN
  Administered 2013-03-15: 100 mg via INTRAVENOUS

## 2013-03-15 MED ORDER — HYDROMORPHONE HCL PF 1 MG/ML IJ SOLN
0.2500 mg | INTRAMUSCULAR | Status: DC | PRN
  Administered 2013-03-15 (×3): 0.5 mg via INTRAVENOUS

## 2013-03-15 MED ORDER — HYDROMORPHONE HCL PF 1 MG/ML IJ SOLN
INTRAMUSCULAR | Status: AC
  Administered 2013-03-15: 0.5 mg via INTRAVENOUS
  Filled 2013-03-15: qty 1

## 2013-03-15 MED ORDER — OXYCODONE HCL 5 MG/5ML PO SOLN: 5.0000 mg | Freq: Once | ORAL | Status: DC | PRN

## 2013-03-15 MED ORDER — ETOMIDATE 2 MG/ML IV SOLN
INTRAVENOUS | Status: DC | PRN
  Administered 2013-03-15: 16 mg via INTRAVENOUS

## 2013-03-15 MED ORDER — HYDROMORPHONE HCL PF 1 MG/ML IJ SOLN
INTRAMUSCULAR | Status: AC
  Filled 2013-03-15: qty 1

## 2013-03-15 MED ORDER — ACETAMINOPHEN 650 MG RE SUPP: 650.0000 mg | Freq: Four times a day (QID) | RECTAL | Status: DC | PRN

## 2013-03-15 MED ORDER — FENTANYL CITRATE 0.05 MG/ML IJ SOLN
INTRAMUSCULAR | Status: DC | PRN
  Administered 2013-03-15: 100 ug via INTRAVENOUS
  Administered 2013-03-15 (×2): 75 ug via INTRAVENOUS
  Administered 2013-03-15: 25 ug via INTRAVENOUS
  Administered 2013-03-15 (×2): 50 ug via INTRAVENOUS

## 2013-03-15 MED ORDER — SODIUM CHLORIDE 0.9 % IR SOLN
Status: DC | PRN
  Administered 2013-03-15: 1000 mL

## 2013-03-15 SURGICAL SUPPLY — 40 items
BLADE SAGITTAL (BLADE) ×4
BLADE SAW THK.89X75X18XSGTL (BLADE) ×1 IMPLANT
CAPT HIP FX BIPOLAR/UNIPOLAR ×1 IMPLANT
CLOTH BEACON ORANGE TIMEOUT ST (SAFETY) ×1 IMPLANT
COVER SURGICAL LIGHT HANDLE (MISCELLANEOUS) ×2 IMPLANT
DRAPE HIP W/POCKET STRL (DRAPE) ×2 IMPLANT
DRAPE INCISE IOBAN 85X60 (DRAPES) ×4 IMPLANT
DRAPE U-SHAPE 47X51 STRL (DRAPES) ×2 IMPLANT
DRSG MEPILEX BORDER 4X12 (GAUZE/BANDAGES/DRESSINGS) ×1 IMPLANT
DRSG MEPILEX BORDER 4X8 (GAUZE/BANDAGES/DRESSINGS) ×1 IMPLANT
DURAPREP 26ML APPLICATOR (WOUND CARE) ×2 IMPLANT
ELECT BLADE 6.5 EXT (BLADE) IMPLANT
ELECT CAUTERY BLADE 6.4 (BLADE) ×2 IMPLANT
ELECT REM PT RETURN 9FT ADLT (ELECTROSURGICAL) ×2
ELECTRODE REM PT RTRN 9FT ADLT (ELECTROSURGICAL) ×1 IMPLANT
EVACUATOR 1/8 PVC DRAIN (DRAIN) IMPLANT
GAUZE XEROFORM 1X8 LF (GAUZE/BANDAGES/DRESSINGS) ×1 IMPLANT
GLOVE BIOGEL PI IND STRL 7.5 (GLOVE) ×1 IMPLANT
GLOVE BIOGEL PI IND STRL 8 (GLOVE) ×1 IMPLANT
GLOVE BIOGEL PI INDICATOR 7.5 (GLOVE) ×2
GLOVE BIOGEL PI INDICATOR 8 (GLOVE) ×1
GLOVE ORTHO TXT STRL SZ7.5 (GLOVE) ×4 IMPLANT
GOWN PREVENTION PLUS LG XLONG (DISPOSABLE) IMPLANT
GOWN PREVENTION PLUS XLARGE (GOWN DISPOSABLE) ×4 IMPLANT
GOWN STRL NON-REIN LRG LVL3 (GOWN DISPOSABLE) ×4 IMPLANT
KIT BASIN OR (CUSTOM PROCEDURE TRAY) ×2 IMPLANT
KIT ROOM TURNOVER OR (KITS) ×2 IMPLANT
MANIFOLD NEPTUNE II (INSTRUMENTS) ×2 IMPLANT
NS IRRIG 1000ML POUR BTL (IV SOLUTION) ×2 IMPLANT
PACK TOTAL JOINT (CUSTOM PROCEDURE TRAY) ×2 IMPLANT
PAD ARMBOARD 7.5X6 YLW CONV (MISCELLANEOUS) ×5 IMPLANT
STAPLER VISISTAT 35W (STAPLE) ×2 IMPLANT
SUT ETHIBOND NAB CT1 #1 30IN (SUTURE) ×4 IMPLANT
SUT VIC AB 1 CTB1 27 (SUTURE) ×4 IMPLANT
SUT VIC AB 2-0 CT1 27 (SUTURE) ×4
SUT VIC AB 2-0 CT1 TAPERPNT 27 (SUTURE) ×2 IMPLANT
TOWEL OR 17X24 6PK STRL BLUE (TOWEL DISPOSABLE) ×2 IMPLANT
TOWEL OR 17X26 10 PK STRL BLUE (TOWEL DISPOSABLE) ×2 IMPLANT
TRAY FOLEY CATH 16FRSI W/METER (SET/KITS/TRAYS/PACK) ×1 IMPLANT
WATER STERILE IRR 1000ML POUR (IV SOLUTION) ×4 IMPLANT

## 2013-03-15 NOTE — Brief Op Note (Signed)
03/14/2013 - 03/15/2013  6:05 PM  PATIENT:  Travis [name]  57 y.o. male  PRE-OPERATIVE DIAGNOSIS:  right hip fracture  POST-OPERATIVE DIAGNOSIS:  right hip fracture  PROCEDURE:  Procedure(s): ARTHROPLASTY BIPOLAR HIP (Right)  SURGEON:  Surgeon(s) and Role:    * [name], MD - Primary  PHYSICIAN ASSISTANT:   ASSISTANTS: none   ANESTHESIA:   general  EBL:  Total I/O In: 1100 [I.V.:1100] Out: [id number] [Urine:1475; Blood:200]  BLOOD ADMINISTERED:none  DRAINS: none   LOCAL MEDICATIONS USED:  NONE  SPECIMEN:  No Specimen  DISPOSITION OF SPECIMEN:  N/A  COUNTS:  YES  TOURNIQUET:  * No tourniquets in log *  DICTATION: .Other Dictation: Dictation Number [id number]  PLAN OF CARE: Admit to inpatient   PATIENT DISPOSITION:  PACU - hemodynamically stable.   Delay start of Pharmacological VTE agent (>24hrs) due to surgical blood loss or risk of bleeding: no

## 2013-03-15 NOTE — Consult Note (Signed)
CARDIOLOGY CONSULT NOTE   Patient ID: Travis [name] MRN: [id number] DOB/AGE: Feb 15, 1956 57 y.o.  Admit Date: 03/14/2013 Referring Physician: Dr. Grandville Silos Primary Physician: Glori Bickers, MD Consulting Cardiologist: Lovena Le Primary Cardiologist - [place] Reason for Consultation:  Pre-op evaluation   Clinical Summary Travis [name] is a 57 y.o.male with a h/o a non-ischemic, ETOH Cardiomyopathy. He fell yesterday and fractured his hip. He was admitted for additional evaluation. The patient denies syncope. He is pending hip surgery. He denies chest pain. He has class 2 CHF symptoms. He drinks whiskey daily. He is non-compliant with his meds, taking lasix and potassium but not taking his other CHF meds. He has occaisional PND and orthopnea. He notes that he can climb a flight of stairs or walk on flat ground without difficulty.   No Known Allergies  Medications Scheduled Medications: . carvedilol  6.25 mg Oral BID WC  . docusate sodium  100 mg Oral BID  . enoxaparin (LOVENOX) injection  40 mg Subcutaneous QHS  . folic acid  1 mg Oral Daily  . furosemide  40 mg Oral Daily  . [START ON 03/16/2013] influenza vac split quadrivalent PF  0.5 mL Intramuscular Tomorrow-1000  . insulin aspart  0-9 Units Subcutaneous Q4H  . lisinopril  5 mg Oral Daily  . LORazepam  0-4 mg Oral Q6H   Followed by  . [START ON 03/16/2013] LORazepam  0-4 mg Oral Q12H  . multivitamin with minerals  1 tablet Oral Daily  . pantoprazole (PROTONIX) IV  40 mg Intravenous Q24H  . [START ON 03/16/2013] pneumococcal 23 valent vaccine  0.5 mL Intramuscular Tomorrow-1000  . senna  1 tablet Oral BID  . spironolactone  25 mg Oral Daily  . thiamine  100 mg Intravenous Daily     Infusions: . sodium chloride 50 mL/hr at 03/15/13 0826     PRN Medications:  HYDROmorphone (DILAUDID) injection, LORazepam, LORazepam, ondansetron (ZOFRAN) IV, oxyCODONE-acetaminophen   Past Medical History  Diagnosis Date  .  Systolic heart failure     EF 20-25% by echo 12/28/10  . Nonischemic cardiomyopathy     minimal coronary disease by cath 12/29/10  . Hemoptysis     secondary to pulmonary edema  . Hyperlipidemia   . Chronic anemia   . GI bleed     15 years ago  . Tobacco abuse   . Alcohol abuse   . Osteoarthritis   . Gout   . Diabetes mellitus, type 2     pt reports his DM is gone  . Alcoholic cardiomyopathy [id number]    Past Surgical History  Procedure Laterality Date  . Cardiac catheterization      Family History  Problem Relation Age of Onset  . Diabetes Mother   . Hypertension Mother   . Diabetes Father   . Hypertension Father     Social History Travis [name] reports that he has been smoking Cigarettes.  He has been smoking about 4.00 packs per day. He does not have any smokeless tobacco history on file. Travis [name] reports that he drinks alcohol.  Review of Systems Otherwise reviewed and negative except as outlined.  Physical Examination Blood pressure 142/92, pulse 101, temperature 98.9 F (37.2 C), temperature source Oral, resp. rate 20, height 6\' 4"  (1.93 m), weight 170 lb 3.1 oz (77.2 kg), SpO2 97.00%.  Intake/Output Summary (Last 24 hours) at 03/15/13 1332 Last data filed at 03/15/13 1200  Gross per 24 hour  Intake    340 ml  Output  1000 ml  Net   -660 ml    Telemetry:  HEENT: Conjunctiva and lids normal, oropharynx clear with dry mucosa. Neck: Supple, no elevated JVP or carotid bruits, no thyromegaly. Lungs: Clear to auscultation, nonlabored breathing at rest. Cardiac: Regular tachy rhythm, soft summation gallop; no systolic murmur, no pericardial rub. Abdomen: Soft, nontender, no hepatomegaly, bowel sounds present, no guarding or rebound. Extremities: No pitting edema, distal pulses 2+. Skin: Warm and dry. Musculoskeletal: No kyphosis. Neuropsychiatric: Alert and oriented x3, affect grossly appropriate.  Prior Cardiac Testing/Procedures  Lab Results  Basic  Metabolic Panel:  Recent Labs Lab 03/14/13 1729 03/15/13 0430  NA 141 143  K 3.7 4.6  CL 101 104  CO2 25 26  GLUCOSE 114* 87  BUN 15 13  CREATININE 0.77 0.84  CALCIUM 8.9 8.2*  MG  --  1.8    Liver Function Tests:  Recent Labs Lab 03/14/13 1729 03/15/13 0430  AST 24 20  ALT 12 10  ALKPHOS 113 97  BILITOT 1.4* 1.7*  PROT 7.7 6.4  ALBUMIN 4.1 3.3*    CBC:  Recent Labs Lab 03/14/13 1729 03/15/13 0430  WBC 7.1 7.7  NEUTROABS 5.3 4.6  HGB 14.8 13.0  HCT 44.6 39.0  MCV 89.7 93.8  PLT 126* 117*    Cardiac Enzymes: No results found for this basename: CKTOTAL, CKMB, CKMBINDEX, TROPONINI,  in the last 168 hours  BNP: No components found with this basename: POCBNP,    Radiology: Dg Chest 1 View  03/14/2013   CLINICAL DATA:  Fall  EXAM: CHEST - 1 VIEW  COMPARISON:  05/29/2012  FINDINGS: Lungs are essentially clear. No focal consolidation. No pleural effusion or pneumothorax.  The heart is normal in size.  Degenerative changes of the right shoulder.  IMPRESSION: No evidence of acute cardiopulmonary disease.   Electronically Signed   By: Julian Hy M.D.   On: 03/14/2013 18:29   Dg Lumbar Spine 2-3 Views  03/14/2013   CLINICAL DATA:  Fall, low back pain  EXAM: LUMBAR SPINE - 2-3 VIEW  COMPARISON:  None.  FINDINGS: Five lumbar type vertebral bodies.  Straightening of the lumbar spine.  No evidence of fracture dislocation. Vertebral body heights are maintained.  Moderate degenerative changes of the lower lumbar spine.  Visualized bony pelvis appears intact.  IMPRESSION: No fracture or dislocation is seen.   Electronically Signed   By: Julian Hy M.D.   On: 03/14/2013 18:27   Dg Hip Bilateral W/pelvis  03/14/2013   CLINICAL DATA:  Fall, right hip pain  EXAM: BILATERAL HIP WITH PELVIS - 4+ VIEW  COMPARISON:  None.  FINDINGS: Subcapital right hip fracture with foreshortening and varus angulation.  Left hip is intact.  Visualized bony pelvis is unremarkable.   Mild degenerative changes of the lower lumbar spine.  IMPRESSION: Subcapital right hip fracture, as described above.   Electronically Signed   By: Julian Hy M.D.   On: 03/14/2013 18:27   Ct Head Wo Contrast  03/14/2013   CLINICAL DATA:  57 year old male with fall, headache and neck pain.  EXAM: CT HEAD WITHOUT CONTRAST  CT CERVICAL SPINE WITHOUT CONTRAST  TECHNIQUE: Multidetector CT imaging of the head and cervical spine was performed following the standard protocol without intravenous contrast. Multiplanar CT image reconstructions of the cervical spine were also generated.  COMPARISON:  None.  FINDINGS: CT HEAD FINDINGS  Mild generalized cerebral volume loss noted.  No acute intracranial abnormalities are identified, including mass lesion or mass effect,  hydrocephalus, extra-axial fluid collection, midline shift, hemorrhage, or acute infarction. The visualized bony calvarium is unremarkable.  CT CERVICAL SPINE FINDINGS  Normal alignment is noted.  There is no evidence of acute fracture, subluxation or prevertebral soft tissue swelling.  Moderate degenerative disc disease/spondylosis is identified from C4-C7. With multilevel facet arthropathy, mild to moderate central spinal and biforaminal narrowing is identified from C4-C7.  No focal bony lesions are identified.  IMPRESSION: No evidence of acute intracranial abnormality or static evidence of acute injury to the cervical spine.  Moderate degenerative changes from C4-C7 with mild-to-moderate central spinal and foraminal narrowing.   Electronically Signed   By: Hassan Rowan M.D.   On: 03/14/2013 18:52   Ct Cervical Spine Wo Contrast  03/14/2013   CLINICAL DATA:  57 year old male with fall, headache and neck pain.  EXAM: CT HEAD WITHOUT CONTRAST  CT CERVICAL SPINE WITHOUT CONTRAST  TECHNIQUE: Multidetector CT imaging of the head and cervical spine was performed following the standard protocol without intravenous contrast. Multiplanar CT image  reconstructions of the cervical spine were also generated.  COMPARISON:  None.  FINDINGS: CT HEAD FINDINGS  Mild generalized cerebral volume loss noted.  No acute intracranial abnormalities are identified, including mass lesion or mass effect, hydrocephalus, extra-axial fluid collection, midline shift, hemorrhage, or acute infarction. The visualized bony calvarium is unremarkable.  CT CERVICAL SPINE FINDINGS  Normal alignment is noted.  There is no evidence of acute fracture, subluxation or prevertebral soft tissue swelling.  Moderate degenerative disc disease/spondylosis is identified from C4-C7. With multilevel facet arthropathy, mild to moderate central spinal and biforaminal narrowing is identified from C4-C7.  No focal bony lesions are identified.  IMPRESSION: No evidence of acute intracranial abnormality or static evidence of acute injury to the cervical spine.  Moderate degenerative changes from C4-C7 with mild-to-moderate central spinal and foraminal narrowing.   Electronically Signed   By: Hassan Rowan M.D.   On: 03/14/2013 18:52   Dg Knee Complete 4 Views Right  03/14/2013   CLINICAL DATA:  Fall, right knee pain  EXAM: RIGHT KNEE - COMPLETE 4+ VIEW  COMPARISON:  05/29/2012  FINDINGS: No fracture or dislocation is seen.  Moderate tricompartmental degenerative changes.  Small suprapatellar knee joint effusion.  IMPRESSION: No fracture or dislocation is seen.  Moderate degenerative changes. Small suprapatellar knee joint effusion.   Electronically Signed   By: Julian Hy M.D.   On: 03/14/2013 18:28     ECG: sinus tachycardia with qrs widening   Impression and Recommendations 1. Hip fracture, pending repair 2. Non-ischemic CM, thought due to ETOH, EF 20%  3. Class 2 CHF (systolic) 4. Ongoing ETOH abuse Rec: ok to proceed with surgery. He is moderate risk. Would avoid fluid shifts and minimize surgical duration. Post op, he will be at risk for DT's as well as worsening CHF. Once taking po's  will reinitiate CHF meds. Will follow with you. Low sodium intake.    Signed: Mikle Bosworth.D.

## 2013-03-15 NOTE — Preoperative (Signed)
Beta Blockers   Reason not to administer Beta Blockers:Not Applicable, given at [id number] 03/15/2013

## 2013-03-15 NOTE — Anesthesia Procedure Notes (Signed)
Procedure Name: Intubation Date/Time: 03/15/2013 4:41 PM Performed by: Marinda Elk A Pre-anesthesia Checklist: Patient identified, Timeout performed, Emergency Drugs available, Suction available and Patient being monitored Patient Re-evaluated:Patient Re-evaluated prior to inductionOxygen Delivery Method: Circle system utilized Preoxygenation: Pre-oxygenation with 100% oxygen Intubation Type: IV induction, Rapid sequence and Cricoid Pressure applied Grade View: Grade I Tube size: 7.5 mm Number of attempts: 1 Airway Equipment and Method: Stylet Placement Confirmation: ETT inserted through vocal cords under direct vision,  breath sounds checked- equal and bilateral and positive ETCO2 Secured at: 21 cm Tube secured with: Tape Dental Injury: Teeth and Oropharynx as per pre-operative assessment

## 2013-03-15 NOTE — Progress Notes (Signed)
[facility] PROGRESS NOTE  Travis [name] [id number] DOB: May 08, 1955 DOA: 03/14/2013 PCP: Glori Bickers, MD  Assessment/Plan: #1 right hip femoral neck fracture Secondary to mechanical fall. Patient also noted to have an elevated alcohol level on admission. Patient denied any chest pain. No shortness of breath. No syncope. Patient likely needs a right hip hemiarthroplasty per orthopedic recommendations later on today. Due to patient's poor EF of 20-25% and cardiomyopathy will consult with cardiology for cardiac clearance. EKG with a sinus tachycardia. LVH. No significant change from prior EKG. Monitor volume status closely. Orthopedics is following.  #2 nonischemic cardiomyopathy Patient with EF of 20-25%. Patient denies any acute chest pain or shortness of breath. Monitor fluid status closely. EKG with a sinus tachycardia. LVH. No significant change from prior EKG. Per med rec patient only on aspirin at home. Per notes from epic from November of 2013 patient is post to be on lisinopril 10 mg daily, Aldactone 25 mg daily, Lasix 40 mg daily, Coreg 6.25 mg twice a day. Will resume these cardiac medications with lisinopril 5 mg daily. Cardiology consultation pending.  #3 chronic systolic heart failure Patient with the EF of 20-25% per echo of 12/28/2010. Per med rec patient only on aspirin at home. Per notes from cardiology outpatient revisit of November 2013 patient is post to be on Lasix 40 mg daily, Coreg 6.25 mg twice a day, Aldactone 25 mg daily and lisinopril 10 mg daily. Will resume these with 5 mg of lisinopril. Cardiology consultation pending. Monitor volume status closely.  #4 alcohol abuse On admission patient have an elevated alcohol level of 332. Alcohol level this morning is 142. Patient on Ativan withdrawal protocol. Continue thiamine. Continue folic acid. Continue multivitamin. Alcohol cessation expressed to patient.  #5 hyperlipidemia Will check a fasting lipid panel. If  LDL is greater than 100 we'll start a statin.  #6 tobacco abuse Tobacco cessation.  #7 history of type 2 diabetes Will check a hemoglobin A1c. Check CBGs. Place on a sliding scale insulin.  #8 osteoarthritis Pain management.  #9 prophylaxis PPI for GI prophylaxis. SCDs for DVT prophylaxis preoperatively and per orthopedics postoperatively.  Code Status: Full Family Communication: Updated patient and niece at bedside. Disposition Plan: Pending postoperative progress likely will require SNF   Consultants:  Orthopedics: Dr. Ninfa Linden 03/15/2013  Cardiology pending  Procedures:  CT head CT C-spine 03/14/2013  Chest x-ray 03/14/2013  X-ray of the hips and pelvis 03/14/2013  X-ray of the right knee 03/14/2013  X-ray of the L-spine 03/14/2013  Antibiotics:  None  HPI/Subjective: Patient with no complaints. Patient denies any chest pain. No shortness of breath.  Objective: Filed Vitals:   03/15/13 0500  BP: 133/86  Pulse: 113  Temp: 98.9 F (37.2 C)  Resp: 20    Intake/Output Summary (Last 24 hours) at 03/15/13 0951 Last data filed at 03/15/13 0500  Gross per 24 hour  Intake    340 ml  Output    600 ml  Net   -260 ml   Filed Weights   03/14/13 2200  Weight: 77.2 kg (170 lb 3.1 oz)    Exam:   General:  NAD  Cardiovascular: RRR  Respiratory: CTAB anterior lung fields.  Abdomen: Soft, nontender, nondistended, positive bowel sounds.  Musculoskeletal: No clubbing cyanosis or edema. Right lower extremity externally rotated.  Data Reviewed: Basic Metabolic Panel:  Recent Labs Lab 03/14/13 1729 03/15/13 0430  NA 141 143  K 3.7 4.6  CL 101 104  CO2 25 26  GLUCOSE  114* 87  BUN 15 13  CREATININE 0.77 0.84  CALCIUM 8.9 8.2*   Liver Function Tests:  Recent Labs Lab 03/14/13 1729 03/15/13 0430  AST 24 20  ALT 12 10  ALKPHOS 113 97  BILITOT 1.4* 1.7*  PROT 7.7 6.4  ALBUMIN 4.1 3.3*   No results found for this basename: LIPASE,  AMYLASE,  in the last 168 hours No results found for this basename: AMMONIA,  in the last 168 hours CBC:  Recent Labs Lab 03/14/13 1729 03/15/13 0430  WBC 7.1 7.7  NEUTROABS 5.3 PENDING  HGB 14.8 13.0  HCT 44.6 39.0  MCV 89.7 93.8  PLT 126* 117*   Cardiac Enzymes: No results found for this basename: CKTOTAL, CKMB, CKMBINDEX, TROPONINI,  in the last 168 hours BNP (last 3 results) No results found for this basename: PROBNP,  in the last 8760 hours CBG:  Recent Labs Lab 03/14/13 1624  GLUCAP 121*    Recent Results (from the past 240 hour(s))  MRSA PCR SCREENING     Status: None   Collection Time    03/14/13 11:09 PM      Result Value Range Status   MRSA by PCR NEGATIVE  NEGATIVE Final   Comment:            The GeneXpert MRSA Assay (FDA     approved for NASAL specimens     only), is one component of a     comprehensive MRSA colonization     surveillance program. It is not     intended to diagnose MRSA     infection nor to guide or     monitor treatment for     MRSA infections.     Studies: Dg Chest 1 View  03/14/2013   CLINICAL DATA:  Fall  EXAM: CHEST - 1 VIEW  COMPARISON:  05/29/2012  FINDINGS: Lungs are essentially clear. No focal consolidation. No pleural effusion or pneumothorax.  The heart is normal in size.  Degenerative changes of the right shoulder.  IMPRESSION: No evidence of acute cardiopulmonary disease.   Electronically Signed   By: Julian Hy M.D.   On: 03/14/2013 18:29   Dg Lumbar Spine 2-3 Views  03/14/2013   CLINICAL DATA:  Fall, low back pain  EXAM: LUMBAR SPINE - 2-3 VIEW  COMPARISON:  None.  FINDINGS: Five lumbar type vertebral bodies.  Straightening of the lumbar spine.  No evidence of fracture dislocation. Vertebral body heights are maintained.  Moderate degenerative changes of the lower lumbar spine.  Visualized bony pelvis appears intact.  IMPRESSION: No fracture or dislocation is seen.   Electronically Signed   By: Julian Hy M.D.    On: 03/14/2013 18:27   Dg Hip Bilateral W/pelvis  03/14/2013   CLINICAL DATA:  Fall, right hip pain  EXAM: BILATERAL HIP WITH PELVIS - 4+ VIEW  COMPARISON:  None.  FINDINGS: Subcapital right hip fracture with foreshortening and varus angulation.  Left hip is intact.  Visualized bony pelvis is unremarkable.  Mild degenerative changes of the lower lumbar spine.  IMPRESSION: Subcapital right hip fracture, as described above.   Electronically Signed   By: Julian Hy M.D.   On: 03/14/2013 18:27   Ct Head Wo Contrast  03/14/2013   CLINICAL DATA:  58 year old male with fall, headache and neck pain.  EXAM: CT HEAD WITHOUT CONTRAST  CT CERVICAL SPINE WITHOUT CONTRAST  TECHNIQUE: Multidetector CT imaging of the head and cervical spine was performed following the standard protocol  without intravenous contrast. Multiplanar CT image reconstructions of the cervical spine were also generated.  COMPARISON:  None.  FINDINGS: CT HEAD FINDINGS  Mild generalized cerebral volume loss noted.  No acute intracranial abnormalities are identified, including mass lesion or mass effect, hydrocephalus, extra-axial fluid collection, midline shift, hemorrhage, or acute infarction. The visualized bony calvarium is unremarkable.  CT CERVICAL SPINE FINDINGS  Normal alignment is noted.  There is no evidence of acute fracture, subluxation or prevertebral soft tissue swelling.  Moderate degenerative disc disease/spondylosis is identified from C4-C7. With multilevel facet arthropathy, mild to moderate central spinal and biforaminal narrowing is identified from C4-C7.  No focal bony lesions are identified.  IMPRESSION: No evidence of acute intracranial abnormality or static evidence of acute injury to the cervical spine.  Moderate degenerative changes from C4-C7 with mild-to-moderate central spinal and foraminal narrowing.   Electronically Signed   By: Hassan Rowan M.D.   On: 03/14/2013 18:52   Ct Cervical Spine Wo Contrast  03/14/2013    CLINICAL DATA:  57 year old male with fall, headache and neck pain.  EXAM: CT HEAD WITHOUT CONTRAST  CT CERVICAL SPINE WITHOUT CONTRAST  TECHNIQUE: Multidetector CT imaging of the head and cervical spine was performed following the standard protocol without intravenous contrast. Multiplanar CT image reconstructions of the cervical spine were also generated.  COMPARISON:  None.  FINDINGS: CT HEAD FINDINGS  Mild generalized cerebral volume loss noted.  No acute intracranial abnormalities are identified, including mass lesion or mass effect, hydrocephalus, extra-axial fluid collection, midline shift, hemorrhage, or acute infarction. The visualized bony calvarium is unremarkable.  CT CERVICAL SPINE FINDINGS  Normal alignment is noted.  There is no evidence of acute fracture, subluxation or prevertebral soft tissue swelling.  Moderate degenerative disc disease/spondylosis is identified from C4-C7. With multilevel facet arthropathy, mild to moderate central spinal and biforaminal narrowing is identified from C4-C7.  No focal bony lesions are identified.  IMPRESSION: No evidence of acute intracranial abnormality or static evidence of acute injury to the cervical spine.  Moderate degenerative changes from C4-C7 with mild-to-moderate central spinal and foraminal narrowing.   Electronically Signed   By: Hassan Rowan M.D.   On: 03/14/2013 18:52   Dg Knee Complete 4 Views Right  03/14/2013   CLINICAL DATA:  Fall, right knee pain  EXAM: RIGHT KNEE - COMPLETE 4+ VIEW  COMPARISON:  05/29/2012  FINDINGS: No fracture or dislocation is seen.  Moderate tricompartmental degenerative changes.  Small suprapatellar knee joint effusion.  IMPRESSION: No fracture or dislocation is seen.  Moderate degenerative changes. Small suprapatellar knee joint effusion.   Electronically Signed   By: Julian Hy M.D.   On: 03/14/2013 18:28    Scheduled Meds: . enoxaparin (LOVENOX) injection  40 mg Subcutaneous QHS  . folic acid  1 mg Oral  Daily  . [START ON 03/16/2013] influenza vac split quadrivalent PF  0.5 mL Intramuscular Tomorrow-1000  . LORazepam  0-4 mg Oral Q6H   Followed by  . [START ON 03/16/2013] LORazepam  0-4 mg Oral Q12H  . multivitamin with minerals  1 tablet Oral Daily  . pantoprazole (PROTONIX) IV  40 mg Intravenous Q24H  . [START ON 03/16/2013] pneumococcal 23 valent vaccine  0.5 mL Intramuscular Tomorrow-1000  . senna  1 tablet Oral BID  . thiamine  100 mg Intravenous Daily   Continuous Infusions: . sodium chloride 50 mL/hr at 03/15/13 [id number]    Principal Problem:   Closed right hip fracture Active Problems:   DM  HYPERLIPIDEMIA   ANEMIA   Chronic systolic heart failure   Tobacco use disorder   Smoker   Alcohol abuse   Cardiomyopathy, nonischemic    Time spent: 5 mins    [facility] MD [facility] Pager [phone]. If 7PM-7AM, please contact night-coverage at [email or web address], password [facility] 03/15/2013, 9:51 AM  LOS: 1 day

## 2013-03-15 NOTE — Transfer of Care (Signed)
Immediate Anesthesia Transfer of Care Note  Patient: Travis [name]  Procedure(s) Performed: Procedure(s): ARTHROPLASTY BIPOLAR HIP (Right)  Patient Location: PACU  Anesthesia Type:General  Level of Consciousness: sedated  Airway & Oxygen Therapy: Patient Spontanous Breathing and Patient connected to face mask oxygen  Post-op Assessment: Report given to PACU RN and Post -op Vital signs reviewed and stable  Post vital signs: Reviewed and stable  Complications: No apparent anesthesia complications

## 2013-03-15 NOTE — Progress Notes (Signed)
Orthopedic Tech Progress Note Patient Details:  Travis [name] [birth year] [id number]  Patient ID: Travis [name], male   DOB: 1956-01-27, 57 y.o.   MRN: [id number] Trapeze bar patient helper  Hildred Priest 03/15/2013, 2:44 PM

## 2013-03-15 NOTE — Anesthesia Preprocedure Evaluation (Addendum)
Anesthesia Evaluation  Patient identified by MRN, date of birth, ID band Patient awake    Reviewed: Allergy & Precautions, H&P , NPO status , Patient's Chart, lab work & pertinent test results  History of Anesthesia Complications Negative for: history of anesthetic complications  Airway Mallampati: II TM Distance: >3 FB Neck ROM: Full    Dental  (+) Edentulous Upper and Edentulous Lower   Pulmonary COPD COPD inhaler, Current Smoker (4 ppd),  breath sounds clear to auscultation  Pulmonary exam normal       Cardiovascular hypertension (no longer takes his heart and BP meds), Pt. on medications and Pt. on home beta blockers + CAD (minimal non-obstructive disease by cath 2012) and +CHF Rhythm:Regular Rate:Normal  05/15/11 echo:  EF 50-55%, basal inferior, posterior and septal wall hypokinesis '12 ECHO: EF 20-25%   Neuro/Psych negative neurological ROS     GI/Hepatic (+)     substance abuse  alcohol use, H/o GI bleed   Endo/Other  diabetes (no longer on insulin, glu 102)  Renal/GU negative Renal ROS     Musculoskeletal   Abdominal   Peds  Hematology  (+) Blood dyscrasia (plt 112k), ,   Anesthesia Other Findings   Reproductive/Obstetrics                       Anesthesia Physical Anesthesia Plan  ASA: III  Anesthesia Plan: General   Post-op Pain Management:    Induction: Intravenous  Airway Management Planned: Oral ETT  Additional Equipment:   Intra-op Plan:   Post-operative Plan: Extubation in OR  Informed Consent: I have reviewed the patients History and Physical, chart, labs and discussed the procedure including the risks, benefits and alternatives for the proposed anesthesia with the patient or authorized representative who has indicated his/her understanding and acceptance.   Dental advisory given  Plan Discussed with: Surgeon and CRNA  Anesthesia Plan Comments: (Plan routine  monitors, GETA)       Anesthesia Quick Evaluation

## 2013-03-15 NOTE — Consult Note (Signed)
Reason for Consult:  Right hip fracture Referring Physician: ED MD  Travis [name] is an 57 y.o. male.  HPI:   57 yo male with multiple medical problems who was drinking alcohol yesterday and sustained a mechanical fall.  Was brought to the [facility] ED and found to have a right hip fracture.  Past Medical History  Diagnosis Date  . Systolic heart failure     EF 20-25% by echo 12/28/10  . Nonischemic cardiomyopathy     minimal coronary disease by cath 12/29/10  . Hemoptysis     secondary to pulmonary edema  . Hyperlipidemia   . Chronic anemia   . GI bleed     15 years ago  . Tobacco abuse   . Alcohol abuse   . Osteoarthritis   . Gout   . Diabetes mellitus, type 2     pt reports his DM is gone    Past Surgical History  Procedure Laterality Date  . Cardiac catheterization      Family History  Problem Relation Age of Onset  . Diabetes Mother   . Hypertension Mother   . Diabetes Father   . Hypertension Father     Social History:  reports that he has been smoking Cigarettes.  He has been smoking about 4.00 packs per day. He does not have any smokeless tobacco history on file. He reports that he drinks alcohol. He reports that he does not use illicit drugs.  Allergies: No Known Allergies  Medications: I have reviewed the patient's current medications.  Results for orders placed during the hospital encounter of 03/14/13 (from the past 48 hour(s))  GLUCOSE, CAPILLARY     Status: Abnormal   Collection Time    03/14/13  4:24 PM      Result Value Range   Glucose-Capillary 121 (*) 70 - 99 mg/dL  COMPREHENSIVE METABOLIC PANEL     Status: Abnormal   Collection Time    03/14/13  5:29 PM      Result Value Range   Sodium 141  135 - 145 mEq/L   Potassium 3.7  3.5 - 5.1 mEq/L   Chloride 101  96 - 112 mEq/L   CO2 25  19 - 32 mEq/L   Glucose, Bld 114 (*) 70 - 99 mg/dL   BUN 15  6 - 23 mg/dL   Creatinine, Ser 0.77  0.50 - 1.35 mg/dL   Calcium 8.9  8.4 - 10.5 mg/dL   Total Protein  7.7  6.0 - 8.3 g/dL   Albumin 4.1  3.5 - 5.2 g/dL   AST 24  0 - 37 U/L   ALT 12  0 - 53 U/L   Alkaline Phosphatase 113  39 - 117 U/L   Total Bilirubin 1.4 (*) 0.3 - 1.2 mg/dL   GFR calc non Af Amer >90  >90 mL/min   GFR calc Af Amer >90  >90 mL/min   Comment: (NOTE)     The eGFR has been calculated using the CKD EPI equation.     This calculation has not been validated in all clinical situations.     eGFR's persistently <90 mL/min signify possible Chronic Kidney     Disease.  CBC WITH DIFFERENTIAL     Status: Abnormal   Collection Time    03/14/13  5:29 PM      Result Value Range   WBC 7.1  4.0 - 10.5 K/uL   RBC 4.97  4.22 - 5.81 MIL/uL   Hemoglobin 14.8  13.0 - 17.0 g/dL   HCT 44.6  39.0 - 52.0 %   MCV 89.7  78.0 - 100.0 fL   MCH 29.8  26.0 - 34.0 pg   MCHC 33.2  30.0 - 36.0 g/dL   RDW 19.6 (*) 11.5 - 15.5 %   Platelets 126 (*) 150 - 400 K/uL   Neutrophils Relative % 75  43 - 77 %   Neutro Abs 5.3  1.7 - 7.7 K/uL   Lymphocytes Relative 22  12 - 46 %   Lymphs Abs 1.5  0.7 - 4.0 K/uL   Monocytes Relative 3  3 - 12 %   Monocytes Absolute 0.2  0.1 - 1.0 K/uL   Eosinophils Relative 1  0 - 5 %   Eosinophils Absolute 0.1  0.0 - 0.7 K/uL   Basophils Relative 0  0 - 1 %   Basophils Absolute 0.0  0.0 - 0.1 K/uL  ETHANOL     Status: Abnormal   Collection Time    03/14/13  5:29 PM      Result Value Range   Alcohol, Ethyl (B) 332 (*) 0 - 11 mg/dL   Comment:            LOWEST DETECTABLE LIMIT FOR     SERUM ALCOHOL IS 11 mg/dL     FOR MEDICAL PURPOSES ONLY  MRSA PCR SCREENING     Status: None   Collection Time    03/14/13 11:09 PM      Result Value Range   MRSA by PCR NEGATIVE  NEGATIVE   Comment:            The GeneXpert MRSA Assay (FDA     approved for NASAL specimens     only), is one component of a     comprehensive MRSA colonization     surveillance program. It is not     intended to diagnose MRSA     infection nor to guide or     monitor treatment for     MRSA  infections.  URINALYSIS, ROUTINE W REFLEX MICROSCOPIC     Status: None   Collection Time    03/15/13  1:18 AM      Result Value Range   Color, Urine YELLOW  YELLOW   APPearance CLEAR  CLEAR   Specific Gravity, Urine 1.016  1.005 - 1.030   pH 5.0  5.0 - 8.0   Glucose, UA NEGATIVE  NEGATIVE mg/dL   Hgb urine dipstick NEGATIVE  NEGATIVE   Bilirubin Urine NEGATIVE  NEGATIVE   Ketones, ur NEGATIVE  NEGATIVE mg/dL   Protein, ur NEGATIVE  NEGATIVE mg/dL   Urobilinogen, UA 0.2  0.0 - 1.0 mg/dL   Nitrite NEGATIVE  NEGATIVE   Leukocytes, UA NEGATIVE  NEGATIVE   Comment: MICROSCOPIC NOT DONE ON URINES WITH NEGATIVE PROTEIN, BLOOD, LEUKOCYTES, NITRITE, OR GLUCOSE <1000 mg/dL.  PROTIME-INR     Status: None   Collection Time    03/15/13  4:30 AM      Result Value Range   Prothrombin Time 14.9  11.6 - 15.2 seconds   INR 1.20  0.00 - 1.49  TYPE AND SCREEN     Status: None   Collection Time    03/15/13  4:30 AM      Result Value Range   ABO/RH(D) O POS     Antibody Screen NEG     Sample Expiration 03/18/2013    COMPREHENSIVE METABOLIC PANEL     Status: Abnormal  Collection Time    03/15/13  4:30 AM      Result Value Range   Sodium 143  135 - 145 mEq/L   Potassium 4.6  3.5 - 5.1 mEq/L   Comment: DELTA CHECK NOTED     NO VISIBLE HEMOLYSIS   Chloride 104  96 - 112 mEq/L   CO2 26  19 - 32 mEq/L   Glucose, Bld 87  70 - 99 mg/dL   BUN 13  6 - 23 mg/dL   Creatinine, Ser 0.84  0.50 - 1.35 mg/dL   Calcium 8.2 (*) 8.4 - 10.5 mg/dL   Total Protein 6.4  6.0 - 8.3 g/dL   Albumin 3.3 (*) 3.5 - 5.2 g/dL   AST 20  0 - 37 U/L   ALT 10  0 - 53 U/L   Alkaline Phosphatase 97  39 - 117 U/L   Total Bilirubin 1.7 (*) 0.3 - 1.2 mg/dL   GFR calc non Af Amer >90  >90 mL/min   GFR calc Af Amer >90  >90 mL/min   Comment: (NOTE)     The eGFR has been calculated using the CKD EPI equation.     This calculation has not been validated in all clinical situations.     eGFR's persistently <90 mL/min signify  possible Chronic Kidney     Disease.  CBC WITH DIFFERENTIAL     Status: Abnormal (Preliminary result)   Collection Time    03/15/13  4:30 AM      Result Value Range   WBC 7.7  4.0 - 10.5 K/uL   RBC 4.16 (*) 4.22 - 5.81 MIL/uL   Hemoglobin 13.0  13.0 - 17.0 g/dL   HCT 39.0  39.0 - 52.0 %   MCV 93.8  78.0 - 100.0 fL   MCH 31.3  26.0 - 34.0 pg   MCHC 33.3  30.0 - 36.0 g/dL   RDW 19.9 (*) 11.5 - 15.5 %   Platelets 117 (*) 150 - 400 K/uL   Comment: REPEATED TO VERIFY     PLATELET COUNT CONFIRMED BY SMEAR   Neutrophils Relative % PENDING  43 - 77 %   Neutro Abs PENDING  1.7 - 7.7 K/uL   Band Neutrophils PENDING  0 - 10 %   Lymphocytes Relative PENDING  12 - 46 %   Lymphs Abs PENDING  0.7 - 4.0 K/uL   Monocytes Relative PENDING  3 - 12 %   Monocytes Absolute PENDING  0.1 - 1.0 K/uL   Eosinophils Relative PENDING  0 - 5 %   Eosinophils Absolute PENDING  0.0 - 0.7 K/uL   Basophils Relative PENDING  0 - 1 %   Basophils Absolute PENDING  0.0 - 0.1 K/uL   WBC Morphology PENDING     RBC Morphology PENDING     Smear Review PENDING     nRBC PENDING  0 /100 WBC   Metamyelocytes Relative PENDING     Myelocytes PENDING     Promyelocytes Absolute PENDING     Blasts PENDING    ETHANOL     Status: Abnormal   Collection Time    03/15/13  4:30 AM      Result Value Range   Alcohol, Ethyl (B) 142 (*) 0 - 11 mg/dL   Comment:            LOWEST DETECTABLE LIMIT FOR     SERUM ALCOHOL IS 11 mg/dL     FOR MEDICAL PURPOSES ONLY  Dg Chest 1 View  03/14/2013   CLINICAL DATA:  Fall  EXAM: CHEST - 1 VIEW  COMPARISON:  05/29/2012  FINDINGS: Lungs are essentially clear. No focal consolidation. No pleural effusion or pneumothorax.  The heart is normal in size.  Degenerative changes of the right shoulder.  IMPRESSION: No evidence of acute cardiopulmonary disease.   Electronically Signed   By: Julian Hy M.D.   On: 03/14/2013 18:29   Dg Lumbar Spine 2-3 Views  03/14/2013   CLINICAL DATA:  Fall,  low back pain  EXAM: LUMBAR SPINE - 2-3 VIEW  COMPARISON:  None.  FINDINGS: Five lumbar type vertebral bodies.  Straightening of the lumbar spine.  No evidence of fracture dislocation. Vertebral body heights are maintained.  Moderate degenerative changes of the lower lumbar spine.  Visualized bony pelvis appears intact.  IMPRESSION: No fracture or dislocation is seen.   Electronically Signed   By: Julian Hy M.D.   On: 03/14/2013 18:27   Dg Hip Bilateral W/pelvis  03/14/2013   CLINICAL DATA:  Fall, right hip pain  EXAM: BILATERAL HIP WITH PELVIS - 4+ VIEW  COMPARISON:  None.  FINDINGS: Subcapital right hip fracture with foreshortening and varus angulation.  Left hip is intact.  Visualized bony pelvis is unremarkable.  Mild degenerative changes of the lower lumbar spine.  IMPRESSION: Subcapital right hip fracture, as described above.   Electronically Signed   By: Julian Hy M.D.   On: 03/14/2013 18:27   Ct Head Wo Contrast  03/14/2013   CLINICAL DATA:  57 year old male with fall, headache and neck pain.  EXAM: CT HEAD WITHOUT CONTRAST  CT CERVICAL SPINE WITHOUT CONTRAST  TECHNIQUE: Multidetector CT imaging of the head and cervical spine was performed following the standard protocol without intravenous contrast. Multiplanar CT image reconstructions of the cervical spine were also generated.  COMPARISON:  None.  FINDINGS: CT HEAD FINDINGS  Mild generalized cerebral volume loss noted.  No acute intracranial abnormalities are identified, including mass lesion or mass effect, hydrocephalus, extra-axial fluid collection, midline shift, hemorrhage, or acute infarction. The visualized bony calvarium is unremarkable.  CT CERVICAL SPINE FINDINGS  Normal alignment is noted.  There is no evidence of acute fracture, subluxation or prevertebral soft tissue swelling.  Moderate degenerative disc disease/spondylosis is identified from C4-C7. With multilevel facet arthropathy, mild to moderate central spinal and  biforaminal narrowing is identified from C4-C7.  No focal bony lesions are identified.  IMPRESSION: No evidence of acute intracranial abnormality or static evidence of acute injury to the cervical spine.  Moderate degenerative changes from C4-C7 with mild-to-moderate central spinal and foraminal narrowing.   Electronically Signed   By: Hassan Rowan M.D.   On: 03/14/2013 18:52   Ct Cervical Spine Wo Contrast  03/14/2013   CLINICAL DATA:  57 year old male with fall, headache and neck pain.  EXAM: CT HEAD WITHOUT CONTRAST  CT CERVICAL SPINE WITHOUT CONTRAST  TECHNIQUE: Multidetector CT imaging of the head and cervical spine was performed following the standard protocol without intravenous contrast. Multiplanar CT image reconstructions of the cervical spine were also generated.  COMPARISON:  None.  FINDINGS: CT HEAD FINDINGS  Mild generalized cerebral volume loss noted.  No acute intracranial abnormalities are identified, including mass lesion or mass effect, hydrocephalus, extra-axial fluid collection, midline shift, hemorrhage, or acute infarction. The visualized bony calvarium is unremarkable.  CT CERVICAL SPINE FINDINGS  Normal alignment is noted.  There is no evidence of acute fracture, subluxation or prevertebral soft tissue swelling.  Moderate degenerative disc disease/spondylosis is identified from C4-C7. With multilevel facet arthropathy, mild to moderate central spinal and biforaminal narrowing is identified from C4-C7.  No focal bony lesions are identified.  IMPRESSION: No evidence of acute intracranial abnormality or static evidence of acute injury to the cervical spine.  Moderate degenerative changes from C4-C7 with mild-to-moderate central spinal and foraminal narrowing.   Electronically Signed   By: Hassan Rowan M.D.   On: 03/14/2013 18:52   Dg Knee Complete 4 Views Right  03/14/2013   CLINICAL DATA:  Fall, right knee pain  EXAM: RIGHT KNEE - COMPLETE 4+ VIEW  COMPARISON:  05/29/2012  FINDINGS: No  fracture or dislocation is seen.  Moderate tricompartmental degenerative changes.  Small suprapatellar knee joint effusion.  IMPRESSION: No fracture or dislocation is seen.  Moderate degenerative changes. Small suprapatellar knee joint effusion.   Electronically Signed   By: Julian Hy M.D.   On: 03/14/2013 18:28    ROS Blood pressure 133/86, pulse 113, temperature 98.9 F (37.2 C), temperature source Oral, resp. rate 20, height 6\' 4"  (1.93 m), weight 77.2 kg (170 lb 3.1 oz), SpO2 97.00%. Physical Exam  Musculoskeletal:       Right hip: He exhibits decreased range of motion, bony tenderness and deformity.    Assessment/Plan: Right hip femoral neck fracture in a patient with chronic, but stable medical problems 1)  I spoke to him and his niece at the bedside and they understand fully the injury he has and the recommendation for surgery.  He is NPO.  We will proceed with surgery today for a right hip hemiarthroplasty.  Given his alcohol abuse, will need to closely monitor for withdrawal symptoms and given his cardio myopathy, will try not to volume overload.  Travis [name] Y 03/15/2013, 8:14 AM

## 2013-03-16 DIAGNOSIS — S72009A Fracture of unspecified part of neck of unspecified femur, initial encounter for closed fracture: Secondary | ICD-10-CM

## 2013-03-16 LAB — CBC
Hemoglobin: 11.1 g/dL — ABNORMAL LOW (ref 13.0–17.0)
MCH: 30.8 pg (ref 26.0–34.0)
MCHC: 33 g/dL (ref 30.0–36.0)
Platelets: 99 10*3/uL — ABNORMAL LOW (ref 150–400)
RDW: 19.6 % — ABNORMAL HIGH (ref 11.5–15.5)
WBC: 6.5 10*3/uL (ref 4.0–10.5)

## 2013-03-16 LAB — GLUCOSE, CAPILLARY
Glucose-Capillary: 120 mg/dL — ABNORMAL HIGH (ref 70–99)
Glucose-Capillary: 119 mg/dL — ABNORMAL HIGH (ref 70–99)

## 2013-03-16 LAB — BASIC METABOLIC PANEL
BUN: 16 mg/dL (ref 6–23)
Calcium: 7.9 mg/dL — ABNORMAL LOW (ref 8.4–10.5)
Creatinine, Ser: 1.08 mg/dL (ref 0.50–1.35)
GFR calc Af Amer: 86 mL/min — ABNORMAL LOW (ref >90)

## 2013-03-16 LAB — HEMOGLOBIN A1C: Hgb A1c MFr Bld: 6 % — ABNORMAL HIGH (ref <5.7)

## 2013-03-16 MED ORDER — VITAMIN B-1 100 MG PO TABS
100.0000 mg | ORAL_TABLET | Freq: Every day | ORAL | Status: DC
  Administered 2013-03-16 – 2013-03-25 (×10): 100 mg via ORAL
  Filled 2013-03-16 (×10): qty 1

## 2013-03-16 MED ORDER — ASPIRIN EC 325 MG PO TBEC: 325.0000 mg | DELAYED_RELEASE_TABLET | Freq: Two times a day (BID) | ORAL | Status: DC

## 2013-03-16 MED ORDER — OXYCODONE HCL 5 MG PO TABS: 5.0000 mg | ORAL_TABLET | ORAL | Status: DC | PRN

## 2013-03-16 MED ORDER — POTASSIUM CHLORIDE CRYS ER 20 MEQ PO TBCR
40.0000 meq | EXTENDED_RELEASE_TABLET | ORAL | Status: AC
  Administered 2013-03-16 (×2): 40 meq via ORAL
  Filled 2013-03-16 (×2): qty 2

## 2013-03-16 MED ORDER — PANTOPRAZOLE SODIUM 40 MG PO TBEC
40.0000 mg | DELAYED_RELEASE_TABLET | Freq: Every day | ORAL | Status: DC
  Administered 2013-03-16 – 2013-03-24 (×9): 40 mg via ORAL
  Filled 2013-03-16 (×9): qty 1

## 2013-03-16 NOTE — Progress Notes (Signed)
Utilization review completed.  

## 2013-03-16 NOTE — Progress Notes (Signed)
[facility] PROGRESS NOTE  Travis [name] [id number] DOB: January 06, 1956 DOA: 03/14/2013 PCP: Glori Bickers, MD  Assessment/Plan: #1 right hip femoral neck fracture Secondary to mechanical fall. Patient also noted to have an elevated alcohol level on admission. Patient denied any chest pain. No shortness of breath. No syncope. Patient s/p right hip hemiarthroplasty yesterday. Orthopedics is following.  #2 nonischemic cardiomyopathy Patient with EF of 20-25%. Patient denies any acute chest pain or shortness of breath. Monitor fluid status closely. EKG with a sinus tachycardia. LVH. No significant change from prior EKG. Per med rec patient only on aspirin at home. Per notes from epic from November of 2013 patient is post to be on lisinopril 10 mg daily, Aldactone 25 mg daily, Lasix 40 mg daily, Coreg 6.25 mg twice a day. Will resume these cardiac medications with lisinopril 5 mg daily. Cardiology consultation pending.  #3 chronic systolic heart failure Patient with the EF of 20-25% per echo of 12/28/2010. Per med rec patient only on aspirin at home. Per notes from cardiology outpatient revisit of November 2013 patient is post to be on Lasix 40 mg daily, Coreg 6.25 mg twice a day, Aldactone 25 mg daily and lisinopril 10 mg daily. Continue current cardiac meds. Cardiology ff. Monitor volume status closely.  #4 alcohol abuse On admission patient have an elevated alcohol level of 332. Alcohol level yesterday morning is 142. Patient on Ativan withdrawal protocol. Continue thiamine. Continue folic acid. Continue multivitamin. Alcohol cessation expressed to patient.  #5 hyperlipidemia Will check a fasting lipid panel. If LDL is greater than 100 will start a statin.  #6 tobacco abuse Tobacco cessation.  #7 history of type 2 diabetes Hemoglobin A1c = 6. CBGs 120 - 139. Continue sliding scale insulin.  #8 osteoarthritis Pain management.  #9 Hypokalemia Replete.  #10 prophylaxis PPI for  GI prophylaxis. SCDs for DVT prophylaxis preoperatively and per orthopedics postoperatively.  Code Status: Full Family Communication: Updated patient at bedside. Disposition Plan: Pending postoperative progress likely will require SNF   Consultants:  Orthopedics: Dr. Ninfa Linden 03/15/2013  Cardiology Dr Lovena Le 03/15/13    Procedures:  CT head CT C-spine 03/14/2013  Chest x-ray 03/14/2013  X-ray of the hips and pelvis 03/14/2013  X-ray of the right knee 03/14/2013  X-ray of the L-spine 03/14/2013  R hip hemiarthroplasty 03/15/13   Antibiotics:  None  HPI/Subjective: Patient with no complaints. Patient denies any chest pain. No shortness of breath. Patient c/o of pain in leg.  Objective: Filed Vitals:   03/16/13 0500  BP: 124/84  Pulse: 128  Temp: 99.6 F (37.6 C)  Resp: 20    Intake/Output Summary (Last 24 hours) at 03/16/13 1105 Last data filed at 03/16/13 0900  Gross per 24 hour  Intake   1740 ml  Output   2050 ml  Net   -310 ml   Filed Weights   03/14/13 2200  Weight: 77.2 kg (170 lb 3.1 oz)    Exam:   General:  NAD  Cardiovascular: RRR  Respiratory: CTAB anterior lung fields.  Abdomen: Soft, nontender, nondistended, positive bowel sounds.  Musculoskeletal: No clubbing cyanosis or edema. Right lower extremity externally rotated.  Data Reviewed: Basic Metabolic Panel:  Recent Labs Lab 03/14/13 1729 03/15/13 0430 03/16/13 0637  NA 141 143 133*  K 3.7 4.6 3.3*  CL 101 104 94*  CO2 25 26 28   GLUCOSE 114* 87 112*  BUN 15 13 16   CREATININE 0.77 0.84 1.08  CALCIUM 8.9 8.2* 7.9*  MG  --  1.8  --    Liver Function Tests:  Recent Labs Lab 03/14/13 1729 03/15/13 0430  AST 24 20  ALT 12 10  ALKPHOS 113 97  BILITOT 1.4* 1.7*  PROT 7.7 6.4  ALBUMIN 4.1 3.3*   No results found for this basename: LIPASE, AMYLASE,  in the last 168 hours No results found for this basename: AMMONIA,  in the last 168 hours CBC:  Recent Labs Lab  03/14/13 1729 03/15/13 0430 03/16/13 0637  WBC 7.1 7.7 6.5  NEUTROABS 5.3 4.6  --   HGB 14.8 13.0 11.1*  HCT 44.6 39.0 33.6*  MCV 89.7 93.8 93.3  PLT 126* 117* 99*   Cardiac Enzymes: No results found for this basename: CKTOTAL, CKMB, CKMBINDEX, TROPONINI,  in the last 168 hours BNP (last 3 results) No results found for this basename: PROBNP,  in the last 8760 hours CBG:  Recent Labs Lab 03/15/13 1156 03/15/13 1426 03/15/13 1918 03/15/13 2130 03/16/13 0649  GLUCAP 102* 114* 128* 139* 120*    Recent Results (from the past 240 hour(s))  MRSA PCR SCREENING     Status: None   Collection Time    03/14/13 11:09 PM      Result Value Range Status   MRSA by PCR NEGATIVE  NEGATIVE Final   Comment:            The GeneXpert MRSA Assay (FDA     approved for NASAL specimens     only), is one component of a     comprehensive MRSA colonization     surveillance program. It is not     intended to diagnose MRSA     infection nor to guide or     monitor treatment for     MRSA infections.     Studies: Dg Chest 1 View  03/14/2013   CLINICAL DATA:  Fall  EXAM: CHEST - 1 VIEW  COMPARISON:  05/29/2012  FINDINGS: Lungs are essentially clear. No focal consolidation. No pleural effusion or pneumothorax.  The heart is normal in size.  Degenerative changes of the right shoulder.  IMPRESSION: No evidence of acute cardiopulmonary disease.   Electronically Signed   By: Julian Hy M.D.   On: 03/14/2013 18:29   Dg Lumbar Spine 2-3 Views  03/14/2013   CLINICAL DATA:  Fall, low back pain  EXAM: LUMBAR SPINE - 2-3 VIEW  COMPARISON:  None.  FINDINGS: Five lumbar type vertebral bodies.  Straightening of the lumbar spine.  No evidence of fracture dislocation. Vertebral body heights are maintained.  Moderate degenerative changes of the lower lumbar spine.  Visualized bony pelvis appears intact.  IMPRESSION: No fracture or dislocation is seen.   Electronically Signed   By: Julian Hy M.D.   On:  03/14/2013 18:27   Dg Hip Bilateral W/pelvis  03/14/2013   CLINICAL DATA:  Fall, right hip pain  EXAM: BILATERAL HIP WITH PELVIS - 4+ VIEW  COMPARISON:  None.  FINDINGS: Subcapital right hip fracture with foreshortening and varus angulation.  Left hip is intact.  Visualized bony pelvis is unremarkable.  Mild degenerative changes of the lower lumbar spine.  IMPRESSION: Subcapital right hip fracture, as described above.   Electronically Signed   By: Julian Hy M.D.   On: 03/14/2013 18:27   Ct Head Wo Contrast  03/14/2013   CLINICAL DATA:  57 year old male with fall, headache and neck pain.  EXAM: CT HEAD WITHOUT CONTRAST  CT CERVICAL SPINE WITHOUT CONTRAST  TECHNIQUE: Multidetector CT imaging  of the head and cervical spine was performed following the standard protocol without intravenous contrast. Multiplanar CT image reconstructions of the cervical spine were also generated.  COMPARISON:  None.  FINDINGS: CT HEAD FINDINGS  Mild generalized cerebral volume loss noted.  No acute intracranial abnormalities are identified, including mass lesion or mass effect, hydrocephalus, extra-axial fluid collection, midline shift, hemorrhage, or acute infarction. The visualized bony calvarium is unremarkable.  CT CERVICAL SPINE FINDINGS  Normal alignment is noted.  There is no evidence of acute fracture, subluxation or prevertebral soft tissue swelling.  Moderate degenerative disc disease/spondylosis is identified from C4-C7. With multilevel facet arthropathy, mild to moderate central spinal and biforaminal narrowing is identified from C4-C7.  No focal bony lesions are identified.  IMPRESSION: No evidence of acute intracranial abnormality or static evidence of acute injury to the cervical spine.  Moderate degenerative changes from C4-C7 with mild-to-moderate central spinal and foraminal narrowing.   Electronically Signed   By: Hassan Rowan M.D.   On: 03/14/2013 18:52   Ct Cervical Spine Wo Contrast  03/14/2013    CLINICAL DATA:  57 year old male with fall, headache and neck pain.  EXAM: CT HEAD WITHOUT CONTRAST  CT CERVICAL SPINE WITHOUT CONTRAST  TECHNIQUE: Multidetector CT imaging of the head and cervical spine was performed following the standard protocol without intravenous contrast. Multiplanar CT image reconstructions of the cervical spine were also generated.  COMPARISON:  None.  FINDINGS: CT HEAD FINDINGS  Mild generalized cerebral volume loss noted.  No acute intracranial abnormalities are identified, including mass lesion or mass effect, hydrocephalus, extra-axial fluid collection, midline shift, hemorrhage, or acute infarction. The visualized bony calvarium is unremarkable.  CT CERVICAL SPINE FINDINGS  Normal alignment is noted.  There is no evidence of acute fracture, subluxation or prevertebral soft tissue swelling.  Moderate degenerative disc disease/spondylosis is identified from C4-C7. With multilevel facet arthropathy, mild to moderate central spinal and biforaminal narrowing is identified from C4-C7.  No focal bony lesions are identified.  IMPRESSION: No evidence of acute intracranial abnormality or static evidence of acute injury to the cervical spine.  Moderate degenerative changes from C4-C7 with mild-to-moderate central spinal and foraminal narrowing.   Electronically Signed   By: Hassan Rowan M.D.   On: 03/14/2013 18:52   Dg Pelvis Portable  03/15/2013   CLINICAL DATA:  Postop right total hip  EXAM: PORTABLE PELVIS 1-2 VIEWS  COMPARISON:  03/14/2013  FINDINGS: Right total hip arthroplasty in satisfactory position.  Associated subcutaneous gas.  Overlying skin staples.  No fracture or dislocation is seen.  Visualized bony pelvis appears intact.  IMPRESSION: Satisfactory appearance status post right total hip arthroplasty.   Electronically Signed   By: Julian Hy M.D.   On: 03/15/2013 18:56   Dg Knee Complete 4 Views Right  03/14/2013   CLINICAL DATA:  Fall, right knee pain  EXAM: RIGHT KNEE -  COMPLETE 4+ VIEW  COMPARISON:  05/29/2012  FINDINGS: No fracture or dislocation is seen.  Moderate tricompartmental degenerative changes.  Small suprapatellar knee joint effusion.  IMPRESSION: No fracture or dislocation is seen.  Moderate degenerative changes. Small suprapatellar knee joint effusion.   Electronically Signed   By: Julian Hy M.D.   On: 03/14/2013 18:28    Scheduled Meds: . aspirin EC  325 mg Oral Q breakfast  . carvedilol  6.25 mg Oral BID WC  . docusate sodium  100 mg Oral BID  . enoxaparin (LOVENOX) injection  40 mg Subcutaneous QHS  . folic acid  1 mg Oral Daily  . furosemide  40 mg Oral Daily  . influenza vac split quadrivalent PF  0.5 mL Intramuscular Tomorrow-1000  . insulin aspart  0-9 Units Subcutaneous TID WC  . lisinopril  5 mg Oral Daily  . LORazepam  0-4 mg Oral Q6H   Followed by  . LORazepam  0-4 mg Oral Q12H  . multivitamin with minerals  1 tablet Oral Daily  . pantoprazole  40 mg Oral QHS  . pneumococcal 23 valent vaccine  0.5 mL Intramuscular Tomorrow-1000  . potassium chloride  40 mEq Oral Q4H  . senna  1 tablet Oral BID  . spironolactone  25 mg Oral Daily  . thiamine  100 mg Oral Daily   Continuous Infusions: . sodium chloride 50 mL/hr at 03/16/13 0700  . sodium chloride      Principal Problem:   Closed right hip fracture Active Problems:   DM   HYPERLIPIDEMIA   ANEMIA   Chronic systolic heart failure   Tobacco use disorder   Smoker   Alcohol abuse   Cardiomyopathy, nonischemic    Time spent: 36 mins    [facility] MD [facility] Pager [phone]. If 7PM-7AM, please contact night-coverage at [email or web address], password [email or web address] 03/16/2013, 11:05 AM  LOS: 2 days

## 2013-03-16 NOTE — Evaluation (Signed)
Occupational Therapy Evaluation Patient Details Name: Travis [name] MRN: [id number] DOB: September 06, 1955 Today's Date: 03/16/2013 Time: [id number] OT Time Calculation (min): 17 min  OT Assessment / Plan / Recommendation History of present illness 57 y.o. s/p anterior ARTHROPLASTY BIPOLAR HIP (Right)   Clinical Impression   Pt presents with below problem list. Pt independent with ADLs, PTA. Pt will benefit from acute OT to increase independence prior to d/c.     OT Assessment  Patient needs continued OT Services    Follow Up Recommendations  Home health OT;Supervision/Assistance - 24 hour    Barriers to Discharge      Equipment Recommendations  3 in 1 bedside comode;Other (comment) (tub equipment tbd)    Recommendations for Other Services    Frequency  Min 2X/week    Precautions / Restrictions Precautions Precautions: Anterior Hip;Fall Precaution Booklet Issued: No Precaution Comments: Reviewed precautions with pt Restrictions Weight Bearing Restrictions: Yes RLE Weight Bearing: Weight bearing as tolerated   Pertinent Vitals/Pain Pain 8/10. Nurse brought pain meds.     ADL  Grooming: Set up Where Assessed - Grooming: Supported sitting Upper Body Dressing: Set up Where Assessed - Upper Body Dressing: Supported sitting Lower Body Dressing: Min guard Where Assessed - Lower Body Dressing: Supported sit to Lobbyist: Moderate assistance Toilet Transfer Method: Sit to Loss adjuster, chartered: Other (comment) (from bed) Tub/Shower Transfer Method: Not assessed Equipment Used: Gait belt;Rolling walker Transfers/Ambulation Related to ADLs: Mod A/Min guard for sit <> stand transfers and Min A for ambulation-OT educated on technique for ambulation. ADL Comments: Educated on safety with ADLs. Educated on technique for gait to avoid breaking precautions. Educated on tub transfer technique of backing to 3 in 1.     OT Diagnosis: Acute pain  OT Problem List:  Decreased strength;Decreased activity tolerance;Impaired balance (sitting and/or standing);Decreased knowledge of use of DME or AE;Decreased knowledge of precautions;Pain OT Treatment Interventions: Self-care/ADL training;DME and/or AE instruction;Therapeutic activities;Patient/family education;Balance training   OT Goals(Current goals can be found in the care plan section) Acute Rehab OT Goals Patient Stated Goal: not stated OT Goal Formulation: With patient Time For Goal Achievement: 03/23/13 Potential to Achieve Goals: Good  Visit Information  Last OT Received On: 03/16/13 Assistance Needed: +1 History of Present Illness: 57 y.o. s/p anterior ARTHROPLASTY BIPOLAR HIP (Right)       Prior [place] expects to be discharged to:: Private residence Living Arrangements: Other relatives Available Help at Discharge: Family;Available 24 hours/day Type of Home: House Home Access: Stairs to enter [employer] of Steps: 4 Entrance Stairs-Rails: Right Home Layout: One level Home Equipment:  (has walker-unsure what kind) Prior Function Level of Independence: Independent with assistive device(s) Comments: used cane/walker sometimes Communication Communication: No difficulties Dominant Hand: Right         Vision/Perception     Cognition  Cognition Arousal/Alertness: Awake/alert Behavior During Therapy: WFL for tasks assessed/performed Overall Cognitive Status: Within Functional Limits for tasks assessed    Extremity/Trunk Assessment Upper Extremity Assessment Upper Extremity Assessment: Overall WFL for tasks assessed Lower Extremity Assessment Lower Extremity Assessment: Defer to PT evaluation RLE Deficits / Details: Decreased strength and AROM consistent with R hip hemiarthroplasty Cervical / Trunk Assessment Cervical / Trunk Assessment: Normal     Mobility Bed Mobility Bed Mobility: Supine to Sit;Sitting - Scoot to Edge of  Bed Supine to Sit: 3: Mod assist;With rails Sitting - Scoot to Edge of Bed: 4: Min assist Details for Bed Mobility  Assistance: Assisted with LE's when going from supine to sit. Used pad to help scoot. Cues for technique and precautions.  Transfers Transfers: Sit to Stand;Stand to Sit Sit to Stand: 3: Mod assist;With upper extremity assist;From bed Stand to Sit: 4: Min guard;With upper extremity assist;To chair/3-in-1 Details for Transfer Assistance: Cues for technique.           End of Session OT - End of Session Equipment Utilized During Treatment: Gait belt;Rolling walker Activity Tolerance: Patient tolerated treatment well Patient left: in chair;with call bell/phone within reach;with family/visitor present Nurse Communication: Mobility status;Other (comment) (O2)  GO     Benito Mccreedy OTR/L [id number] 03/16/2013, 3:59 PM

## 2013-03-16 NOTE — Progress Notes (Signed)
Bladder scan performed, no urine found in bladder at this time. Will monitor 2 more hours for spontaneous void

## 2013-03-16 NOTE — Evaluation (Signed)
Physical Therapy Evaluation Patient Details Name: Travis [name] MRN: [id number] DOB: Jun 02, 1955 Today's Date: 03/16/2013 Time: 1330-1406 PT Time Calculation (min): 36 min  PT Assessment / Plan / Recommendation History of Present Illness  Pt is a 57 y/o male admitted s/p right hip hemiarthroplasty anterior approach  Clinical Impression  This patient presents with acute pain and decreased functional independence following the above mentioned procedure. At the time of PT eval, pt required mod assist for transfers and min A with frequent VC's for technique during ambulation. Pt unsure of family house he would go to if home was anticipated d/c disposition. PT recommending SNF at this time for STR prior to returning home.      PT Assessment  Patient needs continued PT services    Follow Up Recommendations  SNF    Does the patient have the potential to tolerate intense rehabilitation      Barriers to Discharge        Equipment Recommendations  Rolling walker with 5" wheels;3in1 (PT)    Recommendations for Other Services     Frequency Min 5X/week    Precautions / Restrictions Precautions Precautions: Anterior Hip;Fall Precaution Booklet Issued: No Precaution Comments: Reviewed precautions with pt Restrictions Weight Bearing Restrictions: Yes RLE Weight Bearing: Weight bearing as tolerated   Pertinent Vitals/Pain 8/10 at rest - received pain medication from RN. 9/10 at end of session - pt repositioned for comfort.      Mobility  Bed Mobility Bed Mobility: Sit to Supine;Scooting to HOB Supine to Sit: 3: Mod assist;With rails Sitting - Scoot to Edge of Bed: 4: Min assist Sit to Supine: 3: Mod assist;With rail;HOB flat Scooting to HOB: 4: Min assist;With trapeze Details for Bed Mobility Assistance: Assisted LE's up onto bed. VC's for sequencing and technique, especially when straightening out and moving up in bed.  Transfers Transfers: Sit to Stand;Stand to Sit Sit to Stand:  3: Mod assist;With upper extremity assist;From bed Stand to Sit: 4: Min guard;With upper extremity assist;To bed Details for Transfer Assistance: VC's for hand placement on seated surface prior to initiating transfers Ambulation/Gait Ambulation/Gait Assistance: 4: Min assist Ambulation Distance (Feet): 25 Feet Assistive device: Rolling walker Ambulation/Gait Assistance Details: VC's for sequencing and safety awareness with the RW. Pt had difficulty placing heel on floor, and was cued to increase WB through the RLE and heel strike. Gait Pattern: Step-to pattern;Decreased stride length;Right flexed knee in stance Gait velocity: Decreased Stairs: No Wheelchair Mobility Wheelchair Mobility: No    Exercises Total Joint Exercises Ankle Circles/Pumps: 10 reps Quad Sets: 10 reps Heel Slides: 10 reps   PT Diagnosis: Difficulty walking;Acute pain  PT Problem List: Decreased strength;Decreased range of motion;Decreased activity tolerance;Decreased balance;Decreased mobility;Decreased knowledge of use of DME;Decreased safety awareness;Decreased knowledge of precautions;Pain PT Treatment Interventions: DME instruction;Gait training;Stair training;Functional mobility training;Therapeutic activities;Therapeutic exercise;Neuromuscular re-education;Patient/family education     PT Goals(Current goals can be found in the care plan section) Acute Rehab PT Goals Patient Stated Goal: not stated PT Goal Formulation: With patient/family Time For Goal Achievement: 03/23/13 Potential to Achieve Goals: Good  Visit Information  Last PT Received On: 03/16/13 Assistance Needed: +1 History of Present Illness: Pt is a 57 y/o male admitted s/p right hip hemiarthroplasty anterior approach       Prior [place] expects to be discharged to:: Private residence Living Arrangements: Other relatives Available Help at Discharge: Family;Available 24 hours/day Type of Home: House Home  Access: Stairs to enter [employer] of Steps: 4-5  Entrance Stairs-Rails: Left (when ascending ) Home Layout: One level Home Equipment: None Prior Function Level of Independence: Independent Comments: Pt reports that he has a walker but pt's family reports he does not Communication Communication: No difficulties Dominant Hand: Right    Cognition  Cognition Arousal/Alertness: Awake/alert Behavior During Therapy: WFL for tasks assessed/performed Overall Cognitive Status: Within Functional Limits for tasks assessed    Extremity/Trunk Assessment Upper Extremity Assessment Upper Extremity Assessment: Defer to OT evaluation Lower Extremity Assessment Lower Extremity Assessment: RLE deficits/detail RLE Deficits / Details: Decreased strength and AROM consistent with R hip hemiarthroplasty Cervical / Trunk Assessment Cervical / Trunk Assessment: Normal   Balance Balance Balance Assessed: Yes Static Sitting Balance Static Sitting - Balance Support: Feet supported;Bilateral upper extremity supported Static Sitting - Level of Assistance: 5: Stand by assistance Dynamic Sitting Balance Dynamic Sitting - Balance Support: Bilateral upper extremity supported Dynamic Sitting - Level of Assistance: 5: Stand by assistance (min guard) Static Standing Balance Static Standing - Balance Support: Bilateral upper extremity supported Static Standing - Level of Assistance: 4: Min assist  End of Session    GP     Jolyn Lent 03/16/2013, 2:20 PM  Jolyn Lent, PT, DPT [phone]

## 2013-03-16 NOTE — Progress Notes (Signed)
Physical Therapy Treatment Patient Details Name: Travis [name] MRN: [id number] DOB: 1956-01-19 Today's Date: 03/16/2013 Time: [id number] PT Time Calculation (min): 23 min  PT Assessment / Plan / Recommendation  History of Present Illness Pt is a 56 y/o male admitted s/p right hip hemiarthroplasty anterior approach   PT Comments   Pt very sore from first session of PT, so second session focusing on therapeutic exercise. Heavy assist to achieve ROM, and pt was very limited by pain. Frequent cueing required for breathing technique during exercise, as pt strains and holds his breath.   Follow Up Recommendations  SNF     Does the patient have the potential to tolerate intense rehabilitation     Barriers to Discharge        Equipment Recommendations  Rolling walker with 5" wheels;3in1 (PT)    Recommendations for Other Services    Frequency Min 5X/week   Progress towards PT Goals Progress towards PT goals: Progressing toward goals  Plan Current plan remains appropriate    Precautions / Restrictions Precautions Precautions: Anterior Hip;Fall Precaution Booklet Issued: No Precaution Comments: Reviewed precautions with pt Restrictions Weight Bearing Restrictions: Yes RLE Weight Bearing: Weight bearing as tolerated   Pertinent Vitals/Pain 7/10 at rest    Mobility  Bed Mobility Bed Mobility: Scooting to HOB Scooting to HOB: 4: Min assist;With trapeze Details for Bed Mobility Assistance: VC's for sequencing and technique. Assisted with bend of LLE and held LE in place as he bridged and pushed himself up in the bed using the trapeze.  Transfers Transfers: Not assessed Ambulation/Gait Ambulation/Gait Assistance: Not tested (comment) Stairs: No Wheelchair Mobility Wheelchair Mobility: No    Exercises Total Joint Exercises Ankle Circles/Pumps: 10 reps Quad Sets: 10 reps Gluteal Sets: 10 reps Towel Squeeze: 10 reps Short Arc Quad: 10 reps Heel Slides: 10 reps Hip  ABduction/ADduction: 10 reps   PT Diagnosis: Difficulty walking;Acute pain  PT Problem List: Decreased strength;Decreased range of motion;Decreased activity tolerance;Decreased balance;Decreased mobility;Decreased knowledge of use of DME;Decreased safety awareness;Decreased knowledge of precautions;Pain PT Treatment Interventions: DME instruction;Gait training;Stair training;Functional mobility training;Therapeutic activities;Therapeutic exercise;Neuromuscular re-education;Patient/family education   PT Goals (current goals can now be found in the care plan section) Acute Rehab PT Goals Patient Stated Goal: not stated PT Goal Formulation: With patient/family Time For Goal Achievement: 03/23/13 Potential to Achieve Goals: Good  Visit Information  Last PT Received On: 03/16/13 Assistance Needed: +1 History of Present Illness: Pt is a 57 y/o male admitted s/p right hip hemiarthroplasty anterior approach    Subjective Data  Subjective: I am really hurting but I can't have pain medicine again for awhile.  Patient Stated Goal: not stated   Cognition  Cognition Arousal/Alertness: Awake/alert Behavior During Therapy: WFL for tasks assessed/performed Overall Cognitive Status: Within Functional Limits for tasks assessed    Balance  Balance Balance Assessed: No  End of Session PT - End of Session Activity Tolerance: Patient limited by pain Patient left: in bed;with call bell/phone within reach;with family/visitor present Nurse Communication: Mobility status   GP     Jolyn Lent 03/16/2013, 4:53 PM  Jolyn Lent, [place], DPT [phone]

## 2013-03-16 NOTE — Op Note (Signed)
NAMEJEROMY, Travis [name] NO.:  [id number]  MEDICAL RECORD NO.:  [id number]  LOCATION:  5N08C                        FACILITY:  [facility]  PHYSICIAN:  Lind Guest. Ninfa Linden, M.D.DATE OF BIRTH:  [birth year]  DATE OF PROCEDURE:  03/15/2013 DATE OF DISCHARGE:                              OPERATIVE REPORT   PREOPERATIVE DIAGNOSIS:  Displaced right hip femoral neck fracture.  POSTOPERATIVE DIAGNOSIS:  Displaced right hip femoral neck fracture.  PROCEDURE:  Right hip hemiarthroplasty.  IMPLANTS:  DePuy Summit Basic press-fit stem size 6, size 52 unipolar head with a -3 taper spacer.  SURGEON:  Lind Guest. Ninfa Linden, M.D.  ANESTHESIA:  General.  ANTIBIOTICS:  2 g IV Ancef.  BLOOD LOSS:  200 mL.  COMPLICATIONS:  None.  INDICATIONS:  Mr. Swarr is a 57 year old gentleman in poor health, due to chronic alcohol abuse.  He has alcohol-induced cardiomyopathy, he is a diabetic, and he has not been noncompliant with the medications.  He was drinking heavily yesterday, and sustained a mechanical fall, fracturing his right hip.  He was admitted last night with a high alcohol level.  So, I felt we needed a cardiac clearance and medical admission, prior to proceeding with surgery.  He was seen and cleared by the Cardiology Service today, and I talked to him in length about this surgery, and he understands why we are proceeding.  PROCEDURE DESCRIPTION:  After informed consent was obtained, appropriate right hip was marked.  He was brought to the operating room.  General anesthesia was obtained while he was on a stretcher.  He was then placed supine on the operating table and then turned into the lateral decubitus position with the right operative hip up, and an axillary roll in place, appropriate position in the head, neck, and padding in the arms, and down the leg.  His right operative hip was then prepped and draped with DuraPrep and sterile drapes.  A time-out was called  and he was then identified as correct patient, correct right hip.  I then made incision directly over the greater trochanter and carried this proximally and distally.  We found a large hematoma, just after we dissected through the iliotibial band.  I then placed __________ retractor and proceeded with an anterolateral approach to the hip.  I took down a sleeve of the gluteus, medius, and minimus tendon off the greater trochanter, and I reflected these anterior.  I opened up the hip capsule and found more of a hematoma and found a femoral neck fracture.  I made my femoral neck cut with an oscillating saw proximal to the lesser trochanter.  I then placed a corkscrew guide in the femoral head and the femoral head removed this in its entirety, and it was felt that a size 52 unipolar head with a -3 taper spacer.  We felt the 52 size head was appropriate, so attention was turned to the femur with a Mueller retractor behind the greater trochanter in the leg flexed and extended off side of the table into a leg bag.  We used a canal or a initiating reamer to open up the femoral canal and the canal finder followed by a lateralizing  reamer.  I then began broaching from a size 2 broaches in the Summit Basic press- fit broaching system up to a 6, now with the size 6, I felt like we were proximal enough to lesser trochanter.  We trailed a 52 head with a -3 taper space and reduced this in the pelvis and it was stable throughout its arc of rotation.  I felt his leg lengths were near equal.  We then dislocated the hip and removed the trial components and placed the real Summit Basic press-fit stem size 6 and the real 52 unipolar head with a -3 spacer and we reduced this back in the acetabulum stable.  We copiously irrigated the soft tissue with normal saline solution, then closed the joint capsule with interrupted #1 Ethibond suture, reapproximated the gluteus, medius, and minimus tendons back to  the greater trochanter, and oversewed this with #1 Ethibond suture, followed by a running #1 Vicryl in the iliotibial band, 0 Vicryl in the deep tissue, 2-0 Vicryl in subcutaneous tissue, and staples on the skin.  He was then turned back into supine position, awakened, extubated, and taken to recovery room in stable condition.  All final counts were correct and there were no complications noted.  Postoperatively, we will have him weightbearing as tolerated with physical therapy with anterior hip precautions.     Lind Guest. Ninfa Linden, M.D.     CYB/MEDQ  D:  03/15/2013  T:  03/16/2013  Job:  [id number]

## 2013-03-16 NOTE — Progress Notes (Signed)
Patient complaining of bladder feeling full after voiding only 100cc since removal of foley catheter 8 hours ago. Will bladder scan and implement post foley removal protocol if needed.

## 2013-03-16 NOTE — Progress Notes (Signed)
Orthopedic Tech Progress Note Patient Details:  Travis [name] 1956/03/01 [id number] Trapeze bar     Cammer, Theodoro Parma 03/16/2013, 4:20 PM

## 2013-03-16 NOTE — Progress Notes (Signed)
Subjective: 1 Day Post-Op Procedure(s) (LRB): ARTHROPLASTY BIPOLAR HIP (Right) Patient reports pain as moderate.  Labs not back yet.  Objective: Vital signs in last 24 hours: Temp:  [98.4 F (36.9 C)-99.6 F (37.6 C)] 99.6 F (37.6 C) (12/22 0500) Pulse Rate:  [98-128] 128 (12/22 0500) Resp:  [13-20] 20 (12/22 0500) BP: (105-159)/(74-94) 124/84 mmHg (12/22 0500) SpO2:  [92 %-100 %] 95 % (12/22 0500)  Intake/Output from previous day: 12/21 0701 - 12/22 0700 In: 1500 [I.V.:1500] Out: 2250 [Urine:2050; Blood:200] Intake/Output this shift:     Recent Labs  03/14/13 1729 03/15/13 0430  HGB 14.8 13.0    Recent Labs  03/14/13 1729 03/15/13 0430  WBC 7.1 7.7  RBC 4.97 4.16*  HCT 44.6 39.0  PLT 126* 117*    Recent Labs  03/14/13 1729 03/15/13 0430  NA 141 143  K 3.7 4.6  CL 101 104  CO2 25 26  BUN 15 13  CREATININE 0.77 0.84  GLUCOSE 114* 87  CALCIUM 8.9 8.2*    Recent Labs  03/15/13 0430  INR 1.20    Sensation intact distally Intact pulses distally Dorsiflexion/Plantar flexion intact Incision: scant drainage  Assessment/Plan: 1 Day Post-Op Procedure(s) (LRB): ARTHROPLASTY BIPOLAR HIP (Right) Up with therapy, WBAT right hip, anterior hip precautions, ADL's Aspirin for DVT coverage given alcohol abuse and fall risk  Travis [name] Y 03/16/2013, 7:25 AM

## 2013-03-17 DIAGNOSIS — R9431 Abnormal electrocardiogram [ECG] [EKG]: Secondary | ICD-10-CM | POA: Diagnosis not present

## 2013-03-17 DIAGNOSIS — I5022 Chronic systolic (congestive) heart failure: Secondary | ICD-10-CM

## 2013-03-17 DIAGNOSIS — D62 Acute posthemorrhagic anemia: Secondary | ICD-10-CM | POA: Diagnosis not present

## 2013-03-17 LAB — GLUCOSE, CAPILLARY
Glucose-Capillary: 108 mg/dL — ABNORMAL HIGH (ref 70–99)
Glucose-Capillary: 110 mg/dL — ABNORMAL HIGH (ref 70–99)

## 2013-03-17 LAB — BASIC METABOLIC PANEL
BUN: 20 mg/dL (ref 6–23)
Calcium: 9 mg/dL (ref 8.4–10.5)
Creatinine, Ser: 1.22 mg/dL (ref 0.50–1.35)
GFR calc Af Amer: 74 mL/min — ABNORMAL LOW (ref >90)
GFR calc non Af Amer: 64 mL/min — ABNORMAL LOW (ref >90)
Glucose, Bld: 111 mg/dL — ABNORMAL HIGH (ref 70–99)
Potassium: 4.3 mEq/L (ref 3.5–5.1)
Sodium: 137 mEq/L (ref 135–145)

## 2013-03-17 LAB — CBC
MCHC: 33.3 g/dL (ref 30.0–36.0)
Platelets: 110 10*3/uL — ABNORMAL LOW (ref 150–400)
RDW: 19.4 % — ABNORMAL HIGH (ref 11.5–15.5)

## 2013-03-17 MED ORDER — HALOPERIDOL LACTATE 5 MG/ML IJ SOLN
2.0000 mg | Freq: Once | INTRAMUSCULAR | Status: AC
  Administered 2013-03-17: 2 mg via INTRAVENOUS
  Filled 2013-03-17: qty 1

## 2013-03-17 NOTE — Progress Notes (Signed)
Occupational Therapy Treatment Patient Details Name: Travis [name] MRN: [id number] DOB: 1956/02/17 Today's Date: 03/17/2013 Time: [id number] OT Time Calculation (min): 20 min  OT Assessment / Plan / Recommendation  History of present illness Pt is a 57 y/o male admitted s/p right hip hemiarthroplasty anterior approach   OT comments  This patient is making progress; however has been somewhat confused today, trying to get up by himself (questioning [place] withdrawal). Will continue to benefit from OT.  Follow Up Recommendations  SNF       Equipment Recommendations  3 in 1 bedside comode       Frequency Min 2X/week   Progress towards OT Goals Progress towards OT goals: Progressing toward goals  Plan Discharge plan needs to be updated    Precautions / Restrictions Precautions Precautions: Anterior Hip;Fall Restrictions RLE Weight Bearing: Weight bearing as tolerated   Pertinent Vitals/Pain 3/10 RLE; repositioned    ADL  Lower Body Dressing: Min guard Where Assessed - Lower Body Dressing: Unsupported sit to stand Toilet Transfer: Min Psychiatric nurse Method: Sit to Loss adjuster, chartered:  (Bed>out and down hallway>back to room to recliner) Equipment Used: Gait belt;Rolling walker Transfers/Ambulation Related to ADLs: min guard A for all with RW ADL Comments: Can don/doff socks by bending forward while seated in recliner      OT Goals(current goals can now be found in the care plan section)    Visit Information  Last OT Received On: 03/17/13 Assistance Needed: +1 History of Present Illness: Pt is a 57 y/o male admitted s/p right hip hemiarthroplasty anterior approach          Cognition  Cognition Arousal/Alertness: Awake/alert Behavior During Therapy: Flat affect Overall Cognitive Status: Impaired/Different from baseline Memory: Decreased short-term memory (trying to get up by himself)    Mobility  Bed Mobility Bed Mobility: Sit to Supine Sit to  Supine: 4: Min assist;HOB flat (RLE) Transfers Transfers: Sit to Stand;Stand to Sit Sit to Stand: 4: Min guard;With upper extremity assist;From bed Stand to Sit: 4: Min guard;With upper extremity assist;With armrests;To chair/3-in-1 Details for Transfer Assistance: VCs for safe hand placement          End of Session OT - End of Session Equipment Utilized During Treatment: Gait belt;Rolling walker Activity Tolerance: Patient tolerated treatment well Patient left: in bed;with call bell/phone within reach (with PT starting exercises)    Almon Register [id number] 03/17/2013, 2:05 PM

## 2013-03-17 NOTE — Progress Notes (Signed)
[facility] PROGRESS NOTE  Travis [name] [id number] DOB: 02-04-1956 DOA: 03/14/2013 PCP: Glori Bickers, MD  Assessment/Plan: #1 right hip femoral neck fracture Secondary to mechanical fall. Patient also noted to have an elevated alcohol level on admission. Patient denied any chest pain. No shortness of breath. No syncope. Patient s/p right hip hemiarthroplasty yesterday. Orthopedics is following.  #2 nonischemic cardiomyopathy Patient with EF of 20-25%. Patient denies any acute chest pain or shortness of breath. Monitor fluid status closely. EKG with a sinus tachycardia. LVH. No significant change from prior EKG. Per med rec patient only on aspirin at home. Per notes from epic from November of 2013 patient is post to be on lisinopril 10 mg daily, Aldactone 25 mg daily, Lasix 40 mg daily, Coreg 6.25 mg twice a day. Lasix on hold per cardiology. Continue other current cardiac medications.  #3 chronic systolic heart failure Patient with the EF of 20-25% per echo of 12/28/2010. Per med rec patient only on aspirin at home. Per notes from cardiology outpatient revisit of November 2013 patient is post to be on Lasix 40 mg daily, Coreg 6.25 mg twice a day, Aldactone 25 mg daily and lisinopril 10 mg daily. Continue current cardiac meds. Lasix held per cardiology. Cardiology following and appreciate input and recommendations. Monitor volume status closely.  #4 alcohol abuse On admission patient have an elevated alcohol level of 332. Alcohol level yesterday morning is 142. Patient on Ativan withdrawal protocol. Continue thiamine. Continue folic acid. Continue multivitamin. Alcohol cessation expressed to patient.  #5 hyperlipidemia Will check a fasting lipid panel. If LDL is greater than 100 will start a statin.  #6 tobacco abuse Tobacco cessation.  #7 history of type 2 diabetes Hemoglobin A1c = 6. CBGs 120 - 139. Continue sliding scale insulin.  #8 osteoarthritis Pain management.  #9  Hypokalemia Repleted.   #10 abnormal EKG Patient noted to have some T-wave inversion changes on EKG. Patient denies any active chest pain. Patient will recent cardiac catheterization which was negative. Per cardiology.  #11 prophylaxis PPI for GI prophylaxis. SCDs for DVT prophylaxis preoperatively and per orthopedics postoperatively.  Code Status: Full Family Communication: Updated patient at bedside. Disposition Plan: Likely will require SNF   Consultants:  Orthopedics: Dr. Ninfa Linden 03/15/2013  Cardiology Dr Lovena Le 03/15/13    Procedures:  CT head CT C-spine 03/14/2013  Chest x-ray 03/14/2013  X-ray of the hips and pelvis 03/14/2013  X-ray of the right knee 03/14/2013  X-ray of the L-spine 03/14/2013  R hip hemiarthroplasty 03/15/13   Antibiotics:  None  HPI/Subjective: Patient with no complaints. Patient denies any chest pain. No shortness of breath.   Objective: Filed Vitals:   03/17/13 1552  BP: 105/68  Pulse: 103  Temp: 98.6 F (37 C)  Resp: 20    Intake/Output Summary (Last 24 hours) at 03/17/13 1638 Last data filed at 03/17/13 1500  Gross per 24 hour  Intake    720 ml  Output    300 ml  Net    420 ml   Filed Weights   03/14/13 2200  Weight: 77.2 kg (170 lb 3.1 oz)    Exam:   General:  NAD  Cardiovascular: RRR  Respiratory: CTAB anterior lung fields.  Abdomen: Soft, nontender, nondistended, positive bowel sounds.  Musculoskeletal: No clubbing cyanosis or edema. Right lower extremity externally rotated.  Data Reviewed: Basic Metabolic Panel:  Recent Labs Lab 03/14/13 1729 03/15/13 0430 03/16/13 0637 03/17/13 0624  NA 141 143 133* 137  K 3.7 4.6 3.3*  4.3  CL 101 104 94* 97  CO2 25 26 28 28   GLUCOSE 114* 87 112* 111*  BUN 15 13 16 20   CREATININE 0.77 0.84 1.08 1.22  CALCIUM 8.9 8.2* 7.9* 9.0  MG  --  1.8  --   --    Liver Function Tests:  Recent Labs Lab 03/14/13 1729 03/15/13 0430  AST 24 20  ALT 12 10   ALKPHOS 113 97  BILITOT 1.4* 1.7*  PROT 7.7 6.4  ALBUMIN 4.1 3.3*   No results found for this basename: LIPASE, AMYLASE,  in the last 168 hours No results found for this basename: AMMONIA,  in the last 168 hours CBC:  Recent Labs Lab 03/14/13 1729 03/15/13 0430 03/16/13 0637 03/17/13 0624  WBC 7.1 7.7 6.5 7.5  NEUTROABS 5.3 4.6  --   --   HGB 14.8 13.0 11.1* 10.2*  HCT 44.6 39.0 33.6* 30.6*  MCV 89.7 93.8 93.3 93.6  PLT 126* 117* 99* 110*   Cardiac Enzymes: No results found for this basename: CKTOTAL, CKMB, CKMBINDEX, TROPONINI,  in the last 168 hours BNP (last 3 results) No results found for this basename: PROBNP,  in the last 8760 hours CBG:  Recent Labs Lab 03/16/13 1121 03/16/13 1623 03/16/13 2149 03/17/13 0632 03/17/13 1058  GLUCAP 123* 119* 116* 108* 110*    Recent Results (from the past 240 hour(s))  MRSA PCR SCREENING     Status: None   Collection Time    03/14/13 11:09 PM      Result Value Range Status   MRSA by PCR NEGATIVE  NEGATIVE Final   Comment:            The GeneXpert MRSA Assay (FDA     approved for NASAL specimens     only), is one component of a     comprehensive MRSA colonization     surveillance program. It is not     intended to diagnose MRSA     infection nor to guide or     monitor treatment for     MRSA infections.     Studies: Dg Pelvis Portable  03/15/2013   CLINICAL DATA:  Postop right total hip  EXAM: PORTABLE PELVIS 1-2 VIEWS  COMPARISON:  03/14/2013  FINDINGS: Right total hip arthroplasty in satisfactory position.  Associated subcutaneous gas.  Overlying skin staples.  No fracture or dislocation is seen.  Visualized bony pelvis appears intact.  IMPRESSION: Satisfactory appearance status post right total hip arthroplasty.   Electronically Signed   By: Julian Hy M.D.   On: 03/15/2013 18:56    Scheduled Meds: . aspirin EC  325 mg Oral Q breakfast  . carvedilol  6.25 mg Oral BID WC  . docusate sodium  100 mg Oral  BID  . enoxaparin (LOVENOX) injection  40 mg Subcutaneous QHS  . folic acid  1 mg Oral Daily  . insulin aspart  0-9 Units Subcutaneous TID WC  . lisinopril  5 mg Oral Daily  . LORazepam  0-4 mg Oral Q12H  . multivitamin with minerals  1 tablet Oral Daily  . pantoprazole  40 mg Oral QHS  . senna  1 tablet Oral BID  . spironolactone  25 mg Oral Daily  . thiamine  100 mg Oral Daily   Continuous Infusions:    Principal Problem:   Closed right hip fracture Active Problems:   DM   HYPERLIPIDEMIA   ANEMIA   Chronic systolic heart failure   Tobacco use  disorder   Smoker   Alcohol abuse   Cardiomyopathy, nonischemic   Acute blood loss anemia   Abnormal EKG    Time spent: 35 mins    [facility] MD [facility] Pager [phone]. If 7PM-7AM, please contact night-coverage at [email or web address], password [facility] 03/17/2013, 4:38 PM  LOS: 3 days

## 2013-03-17 NOTE — Progress Notes (Signed)
Patient with increased confusion and agitation. He is un aware of date/time/place/situation. Withdrawal protocol being followed. As an increased fall risk, he was moved closer to the nurses's station and into a camera room for close monitoring; bed alarm remains on. Dr. Grandville Silos was paged and a one time dose of Haldol received. Will continue to monitor his withdrawal symptoms and fall risk.

## 2013-03-17 NOTE — Progress Notes (Signed)
Physical Therapy Treatment Patient Details Name: Travis [name] MRN: [id number] DOB: [birth year] Today's Date: 03/17/2013 Time: [id number] PT Time Calculation (min): 13 min  PT Assessment / Plan / Recommendation  History of Present Illness Pt is a 57 y/o male admitted s/p right hip hemiarthroplasty anterior approach   PT Comments   Pt had just returned to bed after ambulating with OT.  Per RN, pt is confused today & has been attempting to get up by himself.  Questioning ETOH withdrawal.  RN administered ativan.  Due to pt just returning back to bed, only performed LE there-ex.     Follow Up Recommendations  SNF     Does the patient have the potential to tolerate intense rehabilitation     Barriers to Discharge        Equipment Recommendations  Rolling walker with 5" wheels;3in1 (PT)    Recommendations for Other Services    Frequency Min 5X/week   Progress towards PT Goals Progress towards PT goals: Progressing toward goals  Plan Current plan remains appropriate    Precautions / Restrictions Precautions Precautions: Anterior Hip;Fall Precaution Comments: Reviewed precautions with pt Restrictions RLE Weight Bearing: Weight bearing as tolerated   Pertinent Vitals/Pain C/o Rt hip pain but never rated.         Exercises Total Joint Exercises Ankle Circles/Pumps: AROM;Both;15 reps Quad Sets: AROM;Strengthening;Both;15 reps Heel Slides: AAROM;Strengthening;Right;15 reps Hip ABduction/ADduction: AAROM;Strengthening;Right;15 reps Straight Leg Raises: AAROM;Strengthening;Right;15 reps Bridges: AROM;Strengthening;10 reps    PT Goals (current goals can now be found in the care plan section) Acute Rehab PT Goals PT Goal Formulation: With patient/family Time For Goal Achievement: 03/23/13 Potential to Achieve Goals: Good  Visit Information  Last PT Received On: 03/17/13 Assistance Needed: +1 History of Present Illness: Pt is a 57 y/o male admitted s/p right hip hemiarthroplasty  anterior approach    Subjective Data      Cognition  Cognition Arousal/Alertness: Awake/alert Behavior During Therapy: Flat affect Overall Cognitive Status: Impaired/Different from baseline (RN states pt is going through ETOH withdrawals) Memory: Decreased short-term memory (trying to get up by himself)    Balance     End of Session PT - End of Session Patient left: in bed;with call bell/phone within reach;with bed alarm set Nurse Communication: Mobility status   GP     [name] 03/17/2013, 2:10 PM  [name], [place] 03/17/2013

## 2013-03-17 NOTE — Progress Notes (Signed)
Advanced Heart Failure Rounding Note   Subjective:     POD #1 R hip fracture repair after mechanical fall.      Objective:   Weight Range:  Vital Signs:   Temp:  [98.2 F (36.8 C)-99.8 F (37.7 C)] 98.2 F (36.8 C) (12/23 0616) Pulse Rate:  [103-107] 103 (12/23 0616) Resp:  [19-20] 19 (12/23 0616) BP: (91-120)/(58-68) 113/67 mmHg (12/23 0616) SpO2:  [94 %-99 %] 99 % (12/23 0616) Last BM Date: 03/15/13  Weight change: Filed Weights   03/14/13 2200  Weight: 77.2 kg (170 lb 3.1 oz)    Intake/Output:   Intake/Output Summary (Last 24 hours) at 03/17/13 0917 Last data filed at 03/17/13 0700  Gross per 24 hour  Intake 1105.83 ml  Output    550 ml  Net 555.83 ml     Physical Exam: HEENT: Normal  Neck: Supple, no elevated JVP or carotid bruits, no thyromegaly.  Lungs: Clear to auscultation, nonlabored breathing at rest.  Cardiac: Regular tachy rhythm, ; no systolic murmur, no pericardial rub.  Abdomen: Soft, nontender, no hepatomegaly, bowel sounds present, no guarding or rebound.  Extremities: No pitting edema, distal pulses 2+. R hip bandage Skin: Warm and dry.  Musculoskeletal: No kyphosis.  Neuropsychiatric: Alert and oriented x3, affect grossly appropriate.   Telemetry: probable sinus tach (can't exclude AFL) 110-120  Labs: Basic Metabolic Panel:  Recent Labs Lab 03/14/13 1729 03/15/13 0430 03/16/13 0637 03/17/13 0624  NA 141 143 133* 137  K 3.7 4.6 3.3* 4.3  CL 101 104 94* 97  CO2 25 26 28 28   GLUCOSE 114* 87 112* 111*  BUN 15 13 16 20   CREATININE 0.77 0.84 1.08 1.22  CALCIUM 8.9 8.2* 7.9* 9.0  MG  --  1.8  --   --     Liver Function Tests:  Recent Labs Lab 03/14/13 1729 03/15/13 0430  AST 24 20  ALT 12 10  ALKPHOS 113 97  BILITOT 1.4* 1.7*  PROT 7.7 6.4  ALBUMIN 4.1 3.3*   No results found for this basename: LIPASE, AMYLASE,  in the last 168 hours No results found for this basename: AMMONIA,  in the last 168  hours  CBC:  Recent Labs Lab 03/14/13 1729 03/15/13 0430 03/16/13 0637 03/17/13 0624  WBC 7.1 7.7 6.5 7.5  NEUTROABS 5.3 4.6  --   --   HGB 14.8 13.0 11.1* 10.2*  HCT 44.6 39.0 33.6* 30.6*  MCV 89.7 93.8 93.3 93.6  PLT 126* 117* 99* 110*    Cardiac Enzymes: No results found for this basename: CKTOTAL, CKMB, CKMBINDEX, TROPONINI,  in the last 168 hours  BNP: BNP (last 3 results) No results found for this basename: PROBNP,  in the last 8760 hours   Other results:  EKG: Sinus tach 110-120  Imaging: Dg Pelvis Portable  03/15/2013   CLINICAL DATA:  Postop right total hip  EXAM: PORTABLE PELVIS 1-2 VIEWS  COMPARISON:  03/14/2013  FINDINGS: Right total hip arthroplasty in satisfactory position.  Associated subcutaneous gas.  Overlying skin staples.  No fracture or dislocation is seen.  Visualized bony pelvis appears intact.  IMPRESSION: Satisfactory appearance status post right total hip arthroplasty.   Electronically Signed   By: Julian Hy M.D.   On: 03/15/2013 18:56      Medications:     Scheduled Medications: . aspirin EC  325 mg Oral Q breakfast  . carvedilol  6.25 mg Oral BID WC  . docusate sodium  100 mg Oral BID  .  enoxaparin (LOVENOX) injection  40 mg Subcutaneous QHS  . folic acid  1 mg Oral Daily  . furosemide  40 mg Oral Daily  . influenza vac split quadrivalent PF  0.5 mL Intramuscular Tomorrow-1000  . insulin aspart  0-9 Units Subcutaneous TID WC  . lisinopril  5 mg Oral Daily  . LORazepam  0-4 mg Oral Q12H  . multivitamin with minerals  1 tablet Oral Daily  . pantoprazole  40 mg Oral QHS  . pneumococcal 23 valent vaccine  0.5 mL Intramuscular Tomorrow-1000  . senna  1 tablet Oral BID  . spironolactone  25 mg Oral Daily  . thiamine  100 mg Oral Daily     Infusions:     PRN Medications:  acetaminophen, acetaminophen, HYDROcodone-acetaminophen, HYDROmorphone (DILAUDID) injection, LORazepam, LORazepam, menthol-cetylpyridinium,  methocarbamol (ROBAXIN) IV, methocarbamol, metoCLOPramide (REGLAN) injection, metoCLOPramide, morphine injection, ondansetron (ZOFRAN) IV, ondansetron, oxyCODONE-acetaminophen, phenol, polyethylene glycol   Assessment:   1. Fall with hip fracture, POD #1 repair 2. Chronic systolic HG, due to non-ischemic CM, thought due to ETOH, EF 20%  3. Ongoing ETOH abuse 4. Acute blood loss anemia.  5. Sinus tachycardia  Plan/Discussion:    Doing well from cardiac perspective. Suspect rhythm is sinus tachycardia due to anemia, pain and volume depletion. Will hold lasix for now (uses sparingly at home). Agree with dosing of other HF meds. Renal function slightly worse so have encouraged him to drink extra today.   Will get ECG to confirm rhythm is sinus tach and not slow flutter   Length of Stay: [place] 03/17/2013, 9:17 AM  Advanced Heart Failure Team Pager [phone] (M-F; 7a - 4p)  Please contact [facility] for night-coverage after hours (4p -7a ) and weekends on [email or web address]

## 2013-03-17 NOTE — Progress Notes (Signed)
Subjective: 2 Days Post-Op Procedure(s) (LRB): ARTHROPLASTY BIPOLAR HIP (Right) Patient reports pain as moderate.  He been up with therapy who are recommending SNF placement.  Does have asymptomatic acute blood loss anemia.  Objective: Vital signs in last 24 hours: Temp:  [98.2 F (36.8 C)-99.8 F (37.7 C)] 98.2 F (36.8 C) (12/23 0616) Pulse Rate:  [103-107] 103 (12/23 0616) Resp:  [19-20] 19 (12/23 0616) BP: (91-120)/(58-68) 113/67 mmHg (12/23 0616) SpO2:  [94 %-99 %] 99 % (12/23 0616)  Intake/Output from previous day: 12/22 0701 - 12/23 0700 In: 1105.8 [P.O.:720; I.V.:385.8] Out: 550 [Urine:550] Intake/Output this shift:     Recent Labs  03/14/13 1729 03/15/13 0430 03/16/13 0637 03/17/13 0624  HGB 14.8 13.0 11.1* 10.2*    Recent Labs  03/16/13 0637 03/17/13 0624  WBC 6.5 7.5  RBC 3.60* 3.27*  HCT 33.6* 30.6*  PLT 99* 110*    Recent Labs  03/15/13 0430 03/16/13 0637  NA 143 133*  K 4.6 3.3*  CL 104 94*  CO2 26 28  BUN 13 16  CREATININE 0.84 1.08  GLUCOSE 87 112*  CALCIUM 8.2* 7.9*    Recent Labs  03/15/13 0430  INR 1.20    Sensation intact distally Intact pulses distally Dorsiflexion/Plantar flexion intact Incision: scant drainage  Assessment/Plan: 2 Days Post-Op Procedure(s) (LRB): ARTHROPLASTY BIPOLAR HIP (Right) Up with therapy SNF placement when avaiable  Travis [name] 03/17/2013, 7:21 AM

## 2013-03-17 NOTE — Anesthesia Postprocedure Evaluation (Signed)
  Anesthesia Post-op Note  Patient: Travis [name]  Procedure(s) Performed: Procedure(s): ARTHROPLASTY BIPOLAR HIP (Right)  Patient Location: Nursing Unit  Anesthesia Type:General  Level of Consciousness: awake, alert  and oriented  Airway and Oxygen Therapy: Patient Spontanous Breathing  Post-op Pain: mild  Post-op Assessment: Post-op Vital signs reviewed  Post-op Vital Signs: Reviewed and stable  Complications: No apparent anesthesia complications

## 2013-03-17 NOTE — Care Management Note (Signed)
CARE MANAGEMENT NOTE 03/17/2013  Patient:  Travis [name],Travis [name]   Account Number:  [id number]  Date Initiated:  03/16/2013  Documentation initiated by:  Ricki Miller  Subjective/Objective Assessment:   56 yr old male s/p right hip hemiarthroplasty.     Action/Plan:   Per PT patient will require SNF for shortterm rehab. Social worker is aware.   Anticipated DC Date:  03/17/2013   Anticipated DC Plan:  SKILLED NURSING FACILITY  In-house referral  Clinical Social Worker      DC Planning Services  CM consult      Choice offered to / List presented to:             Status of service:  Completed, signed off Discharge Disposition:  [place]

## 2013-03-18 DIAGNOSIS — F17200 Nicotine dependence, unspecified, uncomplicated: Secondary | ICD-10-CM

## 2013-03-18 DIAGNOSIS — D509 Iron deficiency anemia, unspecified: Secondary | ICD-10-CM

## 2013-03-18 DIAGNOSIS — R9431 Abnormal electrocardiogram [ECG] [EKG]: Secondary | ICD-10-CM

## 2013-03-18 DIAGNOSIS — F1010 Alcohol abuse, uncomplicated: Secondary | ICD-10-CM

## 2013-03-18 DIAGNOSIS — D62 Acute posthemorrhagic anemia: Secondary | ICD-10-CM

## 2013-03-18 DIAGNOSIS — E119 Type 2 diabetes mellitus without complications: Secondary | ICD-10-CM

## 2013-03-18 LAB — GLUCOSE, CAPILLARY
Glucose-Capillary: 111 mg/dL — ABNORMAL HIGH (ref 70–99)
Glucose-Capillary: 117 mg/dL — ABNORMAL HIGH (ref 70–99)
Glucose-Capillary: 136 mg/dL — ABNORMAL HIGH (ref 70–99)

## 2013-03-18 LAB — BASIC METABOLIC PANEL
BUN: 19 mg/dL (ref 6–23)
CO2: 26 mEq/L (ref 19–32)
Calcium: 8.6 mg/dL (ref 8.4–10.5)
Glucose, Bld: 139 mg/dL — ABNORMAL HIGH (ref 70–99)
Sodium: 133 mEq/L — ABNORMAL LOW (ref 135–145)

## 2013-03-18 LAB — CBC
Hemoglobin: 8.6 g/dL — ABNORMAL LOW (ref 13.0–17.0)
MCH: 30.8 pg (ref 26.0–34.0)
RBC: 2.79 MIL/uL — ABNORMAL LOW (ref 4.22–5.81)
WBC: 6.4 10*3/uL (ref 4.0–10.5)

## 2013-03-18 LAB — FERRITIN: Ferritin: 1401 ng/mL — ABNORMAL HIGH (ref 22–322)

## 2013-03-18 MED ORDER — POTASSIUM CHLORIDE CRYS ER 20 MEQ PO TBCR
40.0000 meq | EXTENDED_RELEASE_TABLET | Freq: Once | ORAL | Status: AC
  Administered 2013-03-18: 40 meq via ORAL
  Filled 2013-03-18: qty 2

## 2013-03-18 NOTE — Progress Notes (Signed)
  Stable from a cardiac perspective.   ECG reviewed - sinus tach with strain. Cath in 2012 with normal coronaries.  Continue routine post-op care. Continue carvedilol and lisinopril.   We will sign off. Please call with questions.   Benay Spice 3:58 PM

## 2013-03-18 NOTE — Progress Notes (Signed)
Physical Therapy Treatment Patient Details Name: Travis [name] MRN: [id number] DOB: 12/06/1955 Today's Date: 03/18/2013 Time: [id number] PT Time Calculation (min): 20 min  PT Assessment / Plan / Recommendation  History of Present Illness Pt is a 57 y/o male admitted s/p right hip hemiarthroplasty anterior approach   PT Comments   Pt progressing with mobility/PT goals.    Follow Up Recommendations  SNF     Does the patient have the potential to tolerate intense rehabilitation     Barriers to Discharge        Equipment Recommendations  Rolling walker with 5" wheels;3in1 (PT)    Recommendations for Other Services    Frequency Min 5X/week   Progress towards PT Goals Progress towards PT goals: Progressing toward goals  Plan Current plan remains appropriate    Precautions / Restrictions Precautions Precautions: Anterior Hip;Fall Restrictions RLE Weight Bearing: Weight bearing as tolerated   Pertinent Vitals/Pain Reports 9/10 Rt hip with ambulation but did not appear in distress.  Repositioned for comfort.  RN notified for pain medication per pt's request.      Mobility  Bed Mobility Bed Mobility: Not assessed Transfers Transfers: Sit to Stand;Stand to Sit Sit to Stand: 4: Min guard;With upper extremity assist;With armrests;From chair/3-in-1 Stand to Sit: 4: Min guard;With upper extremity assist;With armrests;To chair/3-in-1 Details for Transfer Assistance: cues to reinforce safest hand placement & technique.   Ambulation/Gait Ambulation/Gait Assistance: 4: Min guard Ambulation Distance (Feet): 300 Feet Assistive device: Rolling walker Ambulation/Gait Assistance Details: cues for tall posture, stay closer to RW, & relax UE's.   Gait Pattern: Step-through pattern;Decreased stride length;Trunk flexed Gait velocity: Decreased Stairs: No Wheelchair Mobility Wheelchair Mobility: No      PT Goals (current goals can now be found in the care plan section) Acute Rehab PT  Goals PT Goal Formulation: With patient/family Time For Goal Achievement: 03/23/13 Potential to Achieve Goals: Good  Visit Information  Last PT Received On: 03/18/13 Assistance Needed: +1 History of Present Illness: Pt is a 57 y/o male admitted s/p right hip hemiarthroplasty anterior approach    Subjective Data      Cognition  Cognition Arousal/Alertness: Awake/alert Behavior During Therapy: WFL for tasks assessed/performed Overall Cognitive Status: Within Functional Limits for tasks assessed    Balance     End of Session PT - End of Session Equipment Utilized During Treatment: Gait belt Activity Tolerance: Patient tolerated treatment well Patient left: in chair;with call bell/phone within reach Nurse Communication: Mobility status   GP     [name] 03/18/2013, 10:29 AM   [name], PTA [phone] 03/18/2013

## 2013-03-18 NOTE — Progress Notes (Signed)
Subjective: 3 Days Post-Op Procedure(s) (LRB): ARTHROPLASTY BIPOLAR HIP (Right) Patient reports pain as moderate.  Acute, but asymptomatic acute blood loss anemia.  Awaiting SNF placement.  Objective: Vital signs in last 24 hours: Temp:  [98.2 F (36.8 C)-98.6 F (37 C)] 98.2 F (36.8 C) (12/24 0621) Pulse Rate:  [102-104] 102 (12/24 0757) Resp:  [16-20] 18 (12/24 1147) BP: (104-106)/(64-75) 104/64 mmHg (12/24 0757) SpO2:  [98 %-99 %] 99 % (12/24 1147)  Intake/Output from previous day: 12/23 0701 - 12/24 0700 In: 720 [P.O.:720] Out: 600 [Urine:600] Intake/Output this shift:     Recent Labs  03/16/13 0637 03/17/13 0624 03/18/13 0507  HGB 11.1* 10.2* 8.6*    Recent Labs  03/17/13 0624 03/18/13 0507  WBC 7.5 6.4  RBC 3.27* 2.79*  HCT 30.6* 25.8*  PLT 110* 111*    Recent Labs  03/17/13 0624 03/18/13 0507  NA 137 133*  K 4.3 3.5  CL 97 95*  CO2 28 26  BUN 20 19  CREATININE 1.22 0.99  GLUCOSE 111* 139*  CALCIUM 9.0 8.6   No results found for this basename: LABPT, INR,  in the last 72 hours  Sensation intact distally Intact pulses distally Dorsiflexion/Plantar flexion intact Incision: moderate drainage  Assessment/Plan: 3 Days Post-Op Procedure(s) (LRB): ARTHROPLASTY BIPOLAR HIP (Right) Up with therapy D/C to SNF  Jacinta Penalver Y 03/18/2013, 12:22 PM

## 2013-03-18 NOTE — Progress Notes (Signed)
[facility] PROGRESS NOTE  Travis [name] [id number] DOB: 10/14/1955 DOA: 03/14/2013 PCP: Glori Bickers, MD  Assessment/Plan: #1 right hip femoral neck fracture Secondary to mechanical fall. Patient also noted to have an elevated alcohol level on admission. Patient denied any chest pain. No shortness of breath. No syncope. Patient s/p right hip hemiarthroplasty yesterday. Orthopedics is following.  #2 nonischemic cardiomyopathy Patient with EF of 20-25%. Patient denies any acute chest pain or shortness of breath. Monitor fluid status closely. EKG with a sinus tachycardia. LVH. No significant change from prior EKG. Per med rec patient only on aspirin at home. Per notes from epic from November of 2013 patient is post to be on lisinopril 10 mg daily, Aldactone 25 mg daily, Lasix 40 mg daily, Coreg 6.25 mg twice a day. Lasix on hold per cardiology. Continue other current cardiac medications.  #3 chronic systolic heart failure Patient with the EF of 20-25% per echo of 12/28/2010. Per med rec patient only on aspirin at home. Per notes from cardiology outpatient revisit of November 2013 patient is supposed to be on Lasix 40 mg daily, Coreg 6.25 mg twice a day, Aldactone 25 mg daily and lisinopril 10 mg daily. Continue current cardiac meds. Lasix held per cardiology. Cardiology following and appreciate input and recommendations. Monitor volume status closely.  #4 alcohol abuse On admission patient have an elevated alcohol level of 332. Patient on Ativan withdrawal protocol. Continue thiamine. Continue folic acid. Continue multivitamin. Alcohol cessation expressed to patient.  #5 hyperlipidemia Will check a fasting lipid panel. If LDL is greater than 100 will start a statin.  #6 tobacco abuse Tobacco cessation.  #7 history of type 2 diabetes Hemoglobin A1c = 6. CBGs 120 - 139. Continue sliding scale insulin.  #8 osteoarthritis Pain management.  #9 Hypokalemia Repleted.   #10  abnormal EKG Patient noted to have some T-wave inversion changes on EKG. Patient denies any active chest pain. Patient will recent cardiac catheterization which was negative in 2012. Per cardiology.  #11 prophylaxis PPI for GI prophylaxis. SCDs for DVT prophylaxis preoperatively and per orthopedics postoperatively.  Code Status: Full Family Communication: Updated patient at bedside. Disposition Plan:  SNF   Consultants:  Orthopedics: Dr. Ninfa Linden 03/15/2013  Cardiology Dr Lovena Le 03/15/13    Procedures:  CT head CT C-spine 03/14/2013  Chest x-ray 03/14/2013  X-ray of the hips and pelvis 03/14/2013  X-ray of the right knee 03/14/2013  X-ray of the L-spine 03/14/2013  R hip hemiarthroplasty 03/15/13   Antibiotics:  None  HPI/Subjective: Patient with no complaints. Patient denies any chest pain. No shortness of breath.   Objective: Filed Vitals:   03/18/13 0821  BP:   Pulse:   Temp:   Resp: 16    Intake/Output Summary (Last 24 hours) at 03/18/13 0959 Last data filed at 03/18/13 0700  Gross per 24 hour  Intake    720 ml  Output    600 ml  Net    120 ml   Filed Weights   03/14/13 2200  Weight: 77.2 kg (170 lb 3.1 oz)    Exam:   General:  NAD  Cardiovascular: RRR  Respiratory: CTAB anterior lung fields.  Abdomen: Soft, nontender, nondistended, positive bowel sounds.  Musculoskeletal: No clubbing cyanosis or edema.   Data Reviewed: Basic Metabolic Panel:  Recent Labs Lab 03/14/13 1729 03/15/13 0430 03/16/13 0637 03/17/13 0624 03/18/13 0507  NA 141 143 133* 137 133*  K 3.7 4.6 3.3* 4.3 3.5  CL 101 104 94* 97 95*  CO2 25 26 28 28 26   GLUCOSE 114* 87 112* 111* 139*  BUN 15 13 16 20 19   CREATININE 0.77 0.84 1.08 1.22 0.99  CALCIUM 8.9 8.2* 7.9* 9.0 8.6  MG  --  1.8  --   --   --    Liver Function Tests:  Recent Labs Lab 03/14/13 1729 03/15/13 0430  AST 24 20  ALT 12 10  ALKPHOS 113 97  BILITOT 1.4* 1.7*  PROT 7.7 6.4   ALBUMIN 4.1 3.3*   No results found for this basename: LIPASE, AMYLASE,  in the last 168 hours No results found for this basename: AMMONIA,  in the last 168 hours CBC:  Recent Labs Lab 03/14/13 1729 03/15/13 0430 03/16/13 0637 03/17/13 0624 03/18/13 0507  WBC 7.1 7.7 6.5 7.5 6.4  NEUTROABS 5.3 4.6  --   --   --   HGB 14.8 13.0 11.1* 10.2* 8.6*  HCT 44.6 39.0 33.6* 30.6* 25.8*  MCV 89.7 93.8 93.3 93.6 92.5  PLT 126* 117* 99* 110* 111*   Cardiac Enzymes: No results found for this basename: CKTOTAL, CKMB, CKMBINDEX, TROPONINI,  in the last 168 hours BNP (last 3 results) No results found for this basename: PROBNP,  in the last 8760 hours CBG:  Recent Labs Lab 03/17/13 0632 03/17/13 1058 03/17/13 1634 03/17/13 2132 03/18/13 0552  GLUCAP 108* 110* 104* 142* 111*    Recent Results (from the past 240 hour(s))  MRSA PCR SCREENING     Status: None   Collection Time    03/14/13 11:09 PM      Result Value Range Status   MRSA by PCR NEGATIVE  NEGATIVE Final   Comment:            The GeneXpert MRSA Assay (FDA     approved for NASAL specimens     only), is one component of a     comprehensive MRSA colonization     surveillance program. It is not     intended to diagnose MRSA     infection nor to guide or     monitor treatment for     MRSA infections.     Studies: No results found.  Scheduled Meds: . aspirin EC  325 mg Oral Q breakfast  . carvedilol  6.25 mg Oral BID WC  . docusate sodium  100 mg Oral BID  . enoxaparin (LOVENOX) injection  40 mg Subcutaneous QHS  . folic acid  1 mg Oral Daily  . insulin aspart  0-9 Units Subcutaneous TID WC  . lisinopril  5 mg Oral Daily  . LORazepam  0-4 mg Oral Q12H  . multivitamin with minerals  1 tablet Oral Daily  . pantoprazole  40 mg Oral QHS  . senna  1 tablet Oral BID  . spironolactone  25 mg Oral Daily  . thiamine  100 mg Oral Daily   Continuous Infusions:    Principal Problem:   Closed right hip  fracture Active Problems:   DM   HYPERLIPIDEMIA   ANEMIA   Chronic systolic heart failure   Tobacco use disorder   Smoker   Alcohol abuse   Cardiomyopathy, nonischemic   Acute blood loss anemia   Abnormal EKG    Time spent: 35 mins    [facility] MD [facility] Pager [phone]. If 7PM-7AM, please contact night-coverage at [email or web address], password [facility] 03/18/2013, 9:59 AM  LOS: 4 days

## 2013-03-19 LAB — GLUCOSE, CAPILLARY
Glucose-Capillary: 114 mg/dL — ABNORMAL HIGH (ref 70–99)
Glucose-Capillary: 105 mg/dL — ABNORMAL HIGH (ref 70–99)
Glucose-Capillary: 117 mg/dL — ABNORMAL HIGH (ref 70–99)

## 2013-03-19 LAB — BASIC METABOLIC PANEL
BUN: 14 mg/dL (ref 6–23)
CO2: 26 mEq/L (ref 19–32)
Calcium: 8.9 mg/dL (ref 8.4–10.5)
Chloride: 96 mEq/L (ref 96–112)
Creatinine, Ser: 0.89 mg/dL (ref 0.50–1.35)
GFR calc non Af Amer: >90 mL/min (ref >90)
Glucose, Bld: 105 mg/dL — ABNORMAL HIGH (ref 70–99)
Sodium: 133 mEq/L — ABNORMAL LOW (ref 135–145)

## 2013-03-19 LAB — IRON AND TIBC
Iron: <10 ug/dL — ABNORMAL LOW (ref 42–135)
UIBC: 138 ug/dL (ref 125–400)

## 2013-03-19 LAB — CBC
MCH: 31 pg (ref 26.0–34.0)
MCHC: 33.8 g/dL (ref 30.0–36.0)
MCV: 92 fL (ref 78.0–100.0)
Platelets: 128 10*3/uL — ABNORMAL LOW (ref 150–400)
RBC: 2.61 MIL/uL — ABNORMAL LOW (ref 4.22–5.81)
RDW: 19.1 % — ABNORMAL HIGH (ref 11.5–15.5)

## 2013-03-19 MED ORDER — SODIUM CHLORIDE 0.9 % IV SOLN
250.0000 mg | INTRAVENOUS | Status: DC
  Administered 2013-03-19 – 2013-03-21 (×2): 250 mg via INTRAVENOUS
  Filled 2013-03-19 (×4): qty 20

## 2013-03-19 MED ORDER — COLCHICINE 0.6 MG PO TABS
0.6000 mg | ORAL_TABLET | Freq: Two times a day (BID) | ORAL | Status: AC
  Administered 2013-03-19 – 2013-03-20 (×4): 0.6 mg via ORAL
  Filled 2013-03-19 (×4): qty 1

## 2013-03-19 MED ORDER — COLCHICINE 0.6 MG PO TABS
0.6000 mg | ORAL_TABLET | Freq: Two times a day (BID) | ORAL | Status: DC | PRN
  Filled 2013-03-19: qty 1

## 2013-03-19 NOTE — Progress Notes (Signed)
Subjective: 4 Days Post-Op Procedure(s) (LRB): ARTHROPLASTY BIPOLAR HIP (Right) Patient reports pain as moderate.    Objective: Vital signs in last 24 hours: Temp:  [98.4 F (36.9 C)-98.9 F (37.2 C)] 98.4 F (36.9 C) (12/25 0518) Pulse Rate:  [90-106] 90 (12/25 0518) Resp:  [15-18] 18 (12/25 0518) BP: (104-116)/(64-72) 107/70 mmHg (12/25 0518) SpO2:  [97 %-100 %] 97 % (12/25 0518)  Intake/Output from previous day: 12/24 0701 - 12/25 0700 In: 620 [P.O.:620] Out: 1000 [Urine:1000] Intake/Output this shift:     Recent Labs  03/17/13 0624 03/18/13 0507 03/19/13 0525  HGB 10.2* 8.6* 8.1*    Recent Labs  03/18/13 0507 03/19/13 0525  WBC 6.4 5.5  RBC 2.79* 2.61*  HCT 25.8* 24.0*  PLT 111* 128*    Recent Labs  03/18/13 0507 03/19/13 0525  NA 133* 133*  K 3.5 3.8  CL 95* 96  CO2 26 26  BUN 19 14  CREATININE 0.99 0.89  GLUCOSE 139* 105*  CALCIUM 8.6 8.9   No results found for this basename: LABPT, INR,  in the last 72 hours  Neurologically intact  Assessment/Plan: 4 Days Post-Op Procedure(s) (LRB): ARTHROPLASTY BIPOLAR HIP (Right) Up with therapy Discharge to SNF  colcrys ordered for acute  gout Sx right knee  Ronelle Michie C 03/19/2013, 7:24 AM

## 2013-03-19 NOTE — Progress Notes (Signed)
[facility] PROGRESS NOTE  Travis [name] [id number] DOB: September 21, 1955 DOA: 03/14/2013 PCP: Glori Bickers, MD  Assessment/Plan: #1 right hip femoral neck fracture Secondary to mechanical fall. Patient also noted to have an elevated alcohol level on admission. Patient denied any chest pain. No shortness of breath. No syncope. Patient s/p right hip hemiarthroplasty yesterday. Orthopedics is following.  #2 nonischemic cardiomyopathy Patient with EF of 20-25%. Patient denies any acute chest pain or shortness of breath. Monitor fluid status closely. EKG with a sinus tachycardia. LVH. No significant change from prior EKG. Per med rec patient only on aspirin at home. Per notes from epic from November of 2013 patient is post to be on lisinopril 10 mg daily, Aldactone 25 mg daily, Lasix 40 mg daily, Coreg 6.25 mg twice a day. Lasix on hold per cardiology. Continue other current cardiac medications.  #3 chronic systolic heart failure Patient with the EF of 20-25% per echo of 12/28/2010. Per med rec patient only on aspirin at home. Per notes from cardiology outpatient revisit of November 2013 patient is supposed to be on Lasix 40 mg daily, Coreg 6.25 mg twice a day, Aldactone 25 mg daily and lisinopril 10 mg daily. Continue current cardiac meds. Lasix held per cardiology. Cardiology following and appreciate input and recommendations. Monitor volume status closely.  #4 alcohol abuse On admission patient have an elevated alcohol level of 332. Patient on Ativan withdrawal protocol. Continue thiamine. Continue folic acid. Continue multivitamin. Alcohol cessation expressed to patient.  #5 hyperlipidemia Will check a fasting lipid panel. If LDL is greater than 100 will start a statin.  #6 tobacco abuse Tobacco cessation.  #7 history of type 2 diabetes Hemoglobin A1c = 6. CBGs 120 - 139. Continue sliding scale insulin.  #8 osteoarthritis Pain management.  #9 Hypokalemia Repleted.   #10  abnormal EKG Patient noted to have some T-wave inversion changes on EKG. Patient denies any active chest pain. Patient had cardiac catheterization which was negative in 2012. Per cardiology.  #11 Severe Iron deficiency anemia/acute blood loss anemia Patient currently asymptomatic. Iron levels was less than 10. Will place on IV iron. Will likely need oral iron supplementations on discharge. Patient will need further workup as outpatient for his iron deficiency anemia. If patient has not had a colonoscopy done will likely benefit from one.   #11 prophylaxis PPI for GI prophylaxis. SCDs for DVT prophylaxis preoperatively and per orthopedics postoperatively.  Code Status: Full Family Communication: Updated patient at bedside. Disposition Plan:  SNF   Consultants:  Orthopedics: Dr. Ninfa Linden 03/15/2013  Cardiology Dr Lovena Le 03/15/13    Procedures:  CT head CT C-spine 03/14/2013  Chest x-ray 03/14/2013  X-ray of the hips and pelvis 03/14/2013  X-ray of the right knee 03/14/2013  X-ray of the L-spine 03/14/2013  R hip hemiarthroplasty 03/15/13   Antibiotics:  None  HPI/Subjective: Patient with no complaints. Patient denies any chest pain. No shortness of breath.   Objective: Filed Vitals:   03/19/13 0518  BP: 107/70  Pulse: 90  Temp: 98.4 F (36.9 C)  Resp: 18    Intake/Output Summary (Last 24 hours) at 03/19/13 1150 Last data filed at 03/19/13 0745  Gross per 24 hour  Intake    980 ml  Output   1000 ml  Net    -20 ml   Filed Weights   03/14/13 2200  Weight: 77.2 kg (170 lb 3.1 oz)    Exam:   General:  NAD  Cardiovascular: RRR  Respiratory: CTAB anterior lung fields.  Abdomen: Soft, nontender, nondistended, positive bowel sounds.  Musculoskeletal: No clubbing cyanosis or edema.   Data Reviewed: Basic Metabolic Panel:  Recent Labs Lab 03/15/13 0430 03/16/13 0637 03/17/13 0624 03/18/13 0507 03/19/13 0525  NA 143 133* 137 133* 133*  K 4.6  3.3* 4.3 3.5 3.8  CL 104 94* 97 95* 96  CO2 26 28 28 26 26   GLUCOSE 87 112* 111* 139* 105*  BUN 13 16 20 19 14   CREATININE 0.84 1.08 1.22 0.99 0.89  CALCIUM 8.2* 7.9* 9.0 8.6 8.9  MG 1.8  --   --   --   --    Liver Function Tests:  Recent Labs Lab 03/14/13 1729 03/15/13 0430  AST 24 20  ALT 12 10  ALKPHOS 113 97  BILITOT 1.4* 1.7*  PROT 7.7 6.4  ALBUMIN 4.1 3.3*   No results found for this basename: LIPASE, AMYLASE,  in the last 168 hours No results found for this basename: AMMONIA,  in the last 168 hours CBC:  Recent Labs Lab 03/14/13 1729 03/15/13 0430 03/16/13 0637 03/17/13 0624 03/18/13 0507 03/19/13 0525  WBC 7.1 7.7 6.5 7.5 6.4 5.5  NEUTROABS 5.3 4.6  --   --   --   --   HGB 14.8 13.0 11.1* 10.2* 8.6* 8.1*  HCT 44.6 39.0 33.6* 30.6* 25.8* 24.0*  MCV 89.7 93.8 93.3 93.6 92.5 92.0  PLT 126* 117* 99* 110* 111* 128*   Cardiac Enzymes: No results found for this basename: CKTOTAL, CKMB, CKMBINDEX, TROPONINI,  in the last 168 hours BNP (last 3 results) No results found for this basename: PROBNP,  in the last 8760 hours CBG:  Recent Labs Lab 03/18/13 1131 03/18/13 1601 03/18/13 2130 03/19/13 0631 03/19/13 1108  GLUCAP 130* 117* 136* 109* 114*    Recent Results (from the past 240 hour(s))  MRSA PCR SCREENING     Status: None   Collection Time    03/14/13 11:09 PM      Result Value Range Status   MRSA by PCR NEGATIVE  NEGATIVE Final   Comment:            The GeneXpert MRSA Assay (FDA     approved for NASAL specimens     only), is one component of a     comprehensive MRSA colonization     surveillance program. It is not     intended to diagnose MRSA     infection nor to guide or     monitor treatment for     MRSA infections.     Studies: No results found.  Scheduled Meds: . aspirin EC  325 mg Oral Q breakfast  . carvedilol  6.25 mg Oral BID WC  . colchicine  0.6 mg Oral BID  . docusate sodium  100 mg Oral BID  . enoxaparin (LOVENOX)  injection  40 mg Subcutaneous QHS  . folic acid  1 mg Oral Daily  . insulin aspart  0-9 Units Subcutaneous TID WC  . lisinopril  5 mg Oral Daily  . multivitamin with minerals  1 tablet Oral Daily  . pantoprazole  40 mg Oral QHS  . senna  1 tablet Oral BID  . spironolactone  25 mg Oral Daily  . thiamine  100 mg Oral Daily   Continuous Infusions:    Principal Problem:   Closed right hip fracture Active Problems:   DM   HYPERLIPIDEMIA   Iron deficiency anemia, unspecified   Chronic systolic heart failure   Tobacco  use disorder   Smoker   Alcohol abuse   Cardiomyopathy, nonischemic   Acute blood loss anemia   Abnormal EKG    Time spent: 35 mins    [facility] MD [facility] Pager [phone]. If 7PM-7AM, please contact night-coverage at [email or web address], password [facility] 03/19/2013, 11:50 AM  LOS: 5 days

## 2013-03-20 LAB — BASIC METABOLIC PANEL
BUN: 14 mg/dL (ref 6–23)
Calcium: 8.9 mg/dL (ref 8.4–10.5)
Chloride: 96 mEq/L (ref 96–112)
GFR calc Af Amer: >90 mL/min (ref >90)
Glucose, Bld: 115 mg/dL — ABNORMAL HIGH (ref 70–99)
Potassium: 4.1 mEq/L (ref 3.5–5.1)
Sodium: 133 mEq/L — ABNORMAL LOW (ref 135–145)

## 2013-03-20 LAB — CBC
HCT: 23.7 % — ABNORMAL LOW (ref 39.0–52.0)
Hemoglobin: 7.9 g/dL — ABNORMAL LOW (ref 13.0–17.0)
MCHC: 33.3 g/dL (ref 30.0–36.0)
Platelets: 197 10*3/uL (ref 150–400)
RDW: 19.1 % — ABNORMAL HIGH (ref 11.5–15.5)
WBC: 5 10*3/uL (ref 4.0–10.5)

## 2013-03-20 LAB — LIPID PANEL
Cholesterol: 150 mg/dL (ref 0–200)
HDL: 38 mg/dL — ABNORMAL LOW (ref >39)
Total CHOL/HDL Ratio: 3.9 RATIO
Triglycerides: 216 mg/dL — ABNORMAL HIGH (ref <150)

## 2013-03-20 LAB — GLUCOSE, CAPILLARY
Glucose-Capillary: 99 mg/dL (ref 70–99)
Glucose-Capillary: 114 mg/dL — ABNORMAL HIGH (ref 70–99)

## 2013-03-20 NOTE — Progress Notes (Signed)
Advanced Heart Failure Rounding Note   Subjective:     Doing well. Hasn't ambulated much. Denies CP or SOB. Weight stable  Tachycardia resolved.  On tele NSR 80-90s   Objective:   Weight Range:  Vital Signs:   Temp:  [98.6 F (37 C)-99 F (37.2 C)] 98.6 F (37 C) (12/26 0502) Pulse Rate:  [84-90] 84 (12/26 0502) Resp:  [16-18] 18 (12/26 0800) BP: (112-128)/(62-74) 128/74 mmHg (12/26 0502) SpO2:  [94 %-100 %] 100 % (12/26 0502) Weight:  [77.701 kg (171 lb 4.8 oz)-77.973 kg (171 lb 14.4 oz)] 77.973 kg (171 lb 14.4 oz) (12/26 0725) Last BM Date: 03/18/13  Weight change: Filed Weights   03/14/13 2200 03/19/13 1730 03/20/13 0725  Weight: 77.2 kg (170 lb 3.1 oz) 77.701 kg (171 lb 4.8 oz) 77.973 kg (171 lb 14.4 oz)    Intake/Output:   Intake/Output Summary (Last 24 hours) at 03/20/13 1252 Last data filed at 03/20/13 0900  Gross per 24 hour  Intake   1650 ml  Output      0 ml  Net   1650 ml     Physical Exam: HEENT: Normal  Neck: Supple, JVP looks flat or carotid bruits, no thyromegaly.  Lungs: Clear to auscultation, nonlabored breathing at rest.  Cardiac: Regular tachy rhythm, ; no systolic murmur, no pericardial rub.  Abdomen: Soft, nontender, no hepatomegaly, bowel sounds present, no guarding or rebound.  Extremities: No pitting edema, distal pulses 2+. R hip bandage Skin: Warm and dry.  Musculoskeletal: No kyphosis.  Neuropsychiatric: Alert and oriented x3, affect grossly appropriate.   Telemetry: probable sinus tach (can't exclude AFL) 110-120  Labs: Basic Metabolic Panel:  Recent Labs Lab 03/15/13 0430 03/16/13 0637 03/17/13 0624 03/18/13 0507 03/19/13 0525 03/20/13 0825  NA 143 133* 137 133* 133* 133*  K 4.6 3.3* 4.3 3.5 3.8 4.1  CL 104 94* 97 95* 96 96  CO2 26 28 28 26 26 27   GLUCOSE 87 112* 111* 139* 105* 115*  BUN 13 16 20 19 14 14   CREATININE 0.84 1.08 1.22 0.99 0.89 0.89  CALCIUM 8.2* 7.9* 9.0 8.6 8.9 8.9  MG 1.8  --   --   --   --   --      Liver Function Tests:  Recent Labs Lab 03/14/13 1729 03/15/13 0430  AST 24 20  ALT 12 10  ALKPHOS 113 97  BILITOT 1.4* 1.7*  PROT 7.7 6.4  ALBUMIN 4.1 3.3*   No results found for this basename: LIPASE, AMYLASE,  in the last 168 hours No results found for this basename: AMMONIA,  in the last 168 hours  CBC:  Recent Labs Lab 03/14/13 1729 03/15/13 0430 03/16/13 0637 03/17/13 0624 03/18/13 0507 03/19/13 0525 03/20/13 0825  WBC 7.1 7.7 6.5 7.5 6.4 5.5 5.0  NEUTROABS 5.3 4.6  --   --   --   --   --   HGB 14.8 13.0 11.1* 10.2* 8.6* 8.1* 7.9*  HCT 44.6 39.0 33.6* 30.6* 25.8* 24.0* 23.7*  MCV 89.7 93.8 93.3 93.6 92.5 92.0 92.6  PLT 126* 117* 99* 110* 111* 128* 197    Cardiac Enzymes: No results found for this basename: CKTOTAL, CKMB, CKMBINDEX, TROPONINI,  in the last 168 hours  BNP: BNP (last 3 results) No results found for this basename: PROBNP,  in the last 8760 hours   Other results:    Imaging: No results found.   Medications:     Scheduled Medications: . aspirin EC  325 mg Oral Q breakfast  . carvedilol  6.25 mg Oral BID WC  . colchicine  0.6 mg Oral BID  . docusate sodium  100 mg Oral BID  . enoxaparin (LOVENOX) injection  40 mg Subcutaneous QHS  . ferric gluconate (FERRLECIT/NULECIT) IV  250 mg Intravenous QODAY  . folic acid  1 mg Oral Daily  . insulin aspart  0-9 Units Subcutaneous TID WC  . lisinopril  5 mg Oral Daily  . multivitamin with minerals  1 tablet Oral Daily  . pantoprazole  40 mg Oral QHS  . senna  1 tablet Oral BID  . spironolactone  25 mg Oral Daily  . thiamine  100 mg Oral Daily    Infusions:    PRN Medications: acetaminophen, acetaminophen, [START ON 03/21/2013] colchicine, HYDROmorphone (DILAUDID) injection, menthol-cetylpyridinium, methocarbamol (ROBAXIN) IV, methocarbamol, metoCLOPramide (REGLAN) injection, metoCLOPramide, morphine injection, ondansetron (ZOFRAN) IV, ondansetron, oxyCODONE-acetaminophen, phenol,  polyethylene glycol   Assessment:   1. Fall with hip fracture, POD #1 repair 2. Chronic systolic HG, due to non-ischemic CM, thought due to ETOH, EF 20%  3. Ongoing ETOH abuse 4. Acute blood loss anemia.  5. Sinus tachycardia -resolved  Plan/Discussion:    Doing well from cardiac perspective. Volume status looks good. Will increase lisinopril to 10 daily.   Ready for d/c from HF perspective on:  Carvedilol 6.25 bid Lisinopril 10 daily Spironolactone 25 daily  Lasix 20 mg daily PRN weight gain 3 or more pounds  Will need f/u in HF clinic [phone]  We will sign off. Please call with questions.   Length of Stay: 6 Glori Bickers MD 03/20/2013, 12:52 PM  Advanced Heart Failure Team Pager [phone] (M-F; 7a - 4p)  Please contact [facility] for night-coverage after hours (4p -7a ) and weekends on [email or web address]

## 2013-03-20 NOTE — Progress Notes (Addendum)
Patient ID: Travis [name], male   DOB: Dec 26, 1955, 57 y.o.   MRN: [id number] Subjective: 5 Days Post-Op Procedure(s) (LRB): ARTHROPLASTY BIPOLAR HIP (Right) Awake, alert and oriented x 4. I've had heart troubles in the past.  Patient reports pain as moderate.    Objective:   VITALS:  Temp:  [98.6 F (37 C)-99 F (37.2 C)] 98.6 F (37 C) (12/26 0502) Pulse Rate:  [84-90] 84 (12/26 0502) Resp:  [16-18] 16 (12/26 0502) BP: (112-128)/(62-74) 128/74 mmHg (12/26 0502) SpO2:  [94 %-100 %] 100 % (12/26 0502) Weight:  [77.701 kg (171 lb 4.8 oz)-77.973 kg (171 lb 14.4 oz)] 77.973 kg (171 lb 14.4 oz) (12/26 0725)  Neurologically intact ABD soft Neurovascular intact Sensation intact distally Intact pulses distally Dorsiflexion/Plantar flexion intact Incision: moderate drainage and no erythrema or induration, dressing changed.   LABS  Recent Labs  03/18/13 0507 03/19/13 0525  HGB 8.6* 8.1*  WBC 6.4 5.5  PLT 111* 128*    Recent Labs  03/18/13 0507 03/19/13 0525  NA 133* 133*  K 3.5 3.8  CL 95* 96  CO2 26 26  BUN 19 14  CREATININE 0.99 0.89  GLUCOSE 139* 105*   No results found for this basename: LABPT, INR,  in the last 72 hours   Assessment/Plan: 5 Days Post-Op Procedure(s) (LRB): ARTHROPLASTY BIPOLAR HIP (Right)  Advance diet Up with therapy Discharge to skilled nursing home when medically stable. Orthopaedically stable.  Scottie Stanish E 03/20/2013, 7:39 AM

## 2013-03-20 NOTE — Progress Notes (Signed)
Physical Therapy Treatment Patient Details Name: Travis [name] MRN: [id number] DOB: June 20, 1955 Today's Date: 03/20/2013 Time: [id number] PT Time Calculation (min): 20 min  PT Assessment / Plan / Recommendation  History of Present Illness Pt is a 57 y/o male admitted s/p right hip hemiarthroplasty anterior approach   PT Comments   Continuing to make gains in mobiltiy and activity tolerance  Follow Up Recommendations  SNF     Does the patient have the potential to tolerate intense rehabilitation     Barriers to Discharge        Equipment Recommendations  Rolling walker with 5" wheels;3in1 (PT)    Recommendations for Other Services    Frequency Min 5X/week   Progress towards PT Goals    Plan Current plan remains appropriate    Precautions / Restrictions Precautions Precautions: Anterior Hip;Fall Restrictions RLE Weight Bearing: Weight bearing as tolerated   Pertinent Vitals/Pain R hip pain, though minimal  patient repositioned for comfort back in bed He agrees to get OOB for breakfast and lunch daily     Mobility  Bed Mobility Bed Mobility: Supine to Sit;Sit to Supine Supine to Sit: 4: Min guard Sitting - Scoot to Edge of Bed: 4: Min guard Sit to Supine: 4: Min guard Details for Bed Mobility Assistance: Overall smooth transitions Transfers Transfers: Sit to Stand;Stand to Sit Sit to Stand: 4: Min guard;With upper extremity assist;With armrests;From bed Stand to Sit: 4: Min guard;With upper extremity assist;With armrests;To bed Details for Transfer Assistance: cues to reinforce safest hand placement & technique.   Ambulation/Gait Ambulation/Gait Assistance: 4: Min guard Ambulation Distance (Feet): 300 Feet (greater than) Assistive device: Rolling walker Ambulation/Gait Assistance Details: Cues to fully extend hips for upright posture; adjusted RW up for optimal fit Gait Pattern: Step-through pattern;Decreased stride length;Trunk flexed Gait velocity:  Decreased Stairs: No Wheelchair Mobility Wheelchair Mobility: No    Exercises     PT Diagnosis:    PT Problem List:   PT Treatment Interventions:     PT Goals (current goals can now be found in the care plan section) Acute Rehab PT Goals PT Goal Formulation: With patient/family Time For Goal Achievement: 03/23/13 Potential to Achieve Goals: Good  Visit Information  Last PT Received On: 03/20/13 Assistance Needed: +1 History of Present Illness: Pt is a 57 y/o male admitted s/p right hip hemiarthroplasty anterior approach    Subjective Data  Subjective: Agreeable to getting up   Cognition  Cognition Arousal/Alertness: Awake/alert Behavior During Therapy: WFL for tasks assessed/performed Overall Cognitive Status: Within Functional Limits for tasks assessed    Balance     End of Session PT - End of Session Equipment Utilized During Treatment: Gait belt Activity Tolerance: Patient tolerated treatment well Patient left: in chair;with call bell/phone within reach Nurse Communication: Mobility status   GP     Travis [name] [place], [place]  03/20/2013, 4:11 PM

## 2013-03-20 NOTE — Progress Notes (Signed)
Thank you for consult on Travis [name]. He's min guard assist for mobility and is ambulating 300 feet.  He's too high level for CIR. SNF recommended for follow up therapies and concur with current recommendations.

## 2013-03-20 NOTE — Progress Notes (Signed)
[facility] PROGRESS NOTE  Travis [name] [id number] DOB: [birth year] DOA: 03/14/2013 PCP: Glori Bickers, MD  Assessment/Plan: #1 right hip femoral neck fracture Secondary to mechanical fall. Patient also noted to have an elevated alcohol level on admission. Patient denied any chest pain. No shortness of breath. No syncope. Patient s/p right hip hemiarthroplasty yesterday. Orthopedics is following. Needs SNF.  #2 nonischemic cardiomyopathy Patient with EF of 20-25%. Patient denies any acute chest pain or shortness of breath. Monitor fluid status closely. EKG with a sinus tachycardia. LVH. No significant change from prior EKG. Per med rec patient only on aspirin at home. Per notes from epic from November of 2013 patient is post to be on lisinopril 10 mg daily, Aldactone 25 mg daily, Lasix 40 mg daily, Coreg 6.25 mg twice a day. Lasix on hold per cardiology. Continue other current cardiac medications.  #3 chronic systolic heart failure Patient with the EF of 20-25% per echo of 12/28/2010. Per med rec patient only on aspirin at home. Per notes from cardiology outpatient revisit of November 2013 patient is supposed to be on Lasix 40 mg daily, Coreg 6.25 mg twice a day, Aldactone 25 mg daily and lisinopril 10 mg daily. Continue current cardiac meds. Lasix held per cardiology. Cardiology following and appreciate input and recommendations. Monitor volume status closely.  #4 alcohol abuse On admission patient have an elevated alcohol level of 332. Patient on Ativan withdrawal protocol. Continue thiamine. Continue folic acid. Continue multivitamin. Alcohol cessation expressed to patient.  #5 hyperlipidemia LDL is 69. Outpatient f/u.   #6 tobacco abuse Tobacco cessation.  #7 history of type 2 diabetes Hemoglobin A1c = 6. CBGs 99-117.Continue sliding scale insulin.  #8 osteoarthritis Pain management.  #9 Hypokalemia Repleted.   #10 abnormal EKG Patient noted to have some T-wave  inversion changes on EKG. Patient denies any active chest pain. Patient had cardiac catheterization which was negative in 2012. Per cardiology.  #11 Severe Iron deficiency anemia/acute blood loss anemia Patient currently asymptomatic. Iron levels was less than 10. Patient started on IV iron. Will likely need oral iron supplementations on discharge. Patient will need further workup as outpatient for his iron deficiency anemia. If patient has not had a colonoscopy done will likely benefit from one.   #11 prophylaxis PPI for GI prophylaxis. SCDs for DVT prophylaxis preoperatively and per orthopedics postoperatively.  Code Status: Full Family Communication: Updated patient at bedside. Disposition Plan:  SNF   Consultants:  Orthopedics: Dr. Ninfa Linden 03/15/2013  Cardiology Dr Lovena Le 03/15/13    Procedures:  CT head CT C-spine 03/14/2013  Chest x-ray 03/14/2013  X-ray of the hips and pelvis 03/14/2013  X-ray of the right knee 03/14/2013  X-ray of the L-spine 03/14/2013  R hip hemiarthroplasty 03/15/13   Antibiotics:  None  HPI/Subjective: Patient with no complaints. Patient denies any chest pain. No shortness of breath.   Objective: Filed Vitals:   03/20/13 0800  BP:   Pulse:   Temp:   Resp: 18    Intake/Output Summary (Last 24 hours) at 03/20/13 1130 Last data filed at 03/20/13 0900  Gross per 24 hour  Intake   1650 ml  Output      0 ml  Net   1650 ml   Filed Weights   03/14/13 2200 03/19/13 1730 03/20/13 0725  Weight: 77.2 kg (170 lb 3.1 oz) 77.701 kg (171 lb 4.8 oz) 77.973 kg (171 lb 14.4 oz)    Exam:   General:  NAD  Cardiovascular: RRR  Respiratory: CTAB  anterior lung fields.  Abdomen: Soft, nontender, nondistended, positive bowel sounds.  Musculoskeletal: No clubbing cyanosis or edema.   Data Reviewed: Basic Metabolic Panel:  Recent Labs Lab 03/15/13 0430 03/16/13 0637 03/17/13 0624 03/18/13 0507 03/19/13 0525 03/20/13 0825  NA  143 133* 137 133* 133* 133*  K 4.6 3.3* 4.3 3.5 3.8 4.1  CL 104 94* 97 95* 96 96  CO2 26 28 28 26 26 27   GLUCOSE 87 112* 111* 139* 105* 115*  BUN 13 16 20 19 14 14   CREATININE 0.84 1.08 1.22 0.99 0.89 0.89  CALCIUM 8.2* 7.9* 9.0 8.6 8.9 8.9  MG 1.8  --   --   --   --   --    Liver Function Tests:  Recent Labs Lab 03/14/13 1729 03/15/13 0430  AST 24 20  ALT 12 10  ALKPHOS 113 97  BILITOT 1.4* 1.7*  PROT 7.7 6.4  ALBUMIN 4.1 3.3*   No results found for this basename: LIPASE, AMYLASE,  in the last 168 hours No results found for this basename: AMMONIA,  in the last 168 hours CBC:  Recent Labs Lab 03/14/13 1729 03/15/13 0430 03/16/13 0637 03/17/13 0624 03/18/13 0507 03/19/13 0525 03/20/13 0825  WBC 7.1 7.7 6.5 7.5 6.4 5.5 5.0  NEUTROABS 5.3 4.6  --   --   --   --   --   HGB 14.8 13.0 11.1* 10.2* 8.6* 8.1* 7.9*  HCT 44.6 39.0 33.6* 30.6* 25.8* 24.0* 23.7*  MCV 89.7 93.8 93.3 93.6 92.5 92.0 92.6  PLT 126* 117* 99* 110* 111* 128* 197   Cardiac Enzymes: No results found for this basename: CKTOTAL, CKMB, CKMBINDEX, TROPONINI,  in the last 168 hours BNP (last 3 results) No results found for this basename: PROBNP,  in the last 8760 hours CBG:  Recent Labs Lab 03/19/13 1108 03/19/13 1549 03/19/13 2049 03/20/13 0653 03/20/13 1052  GLUCAP 114* 105* 117* 99 114*    Recent Results (from the past 240 hour(s))  MRSA PCR SCREENING     Status: None   Collection Time    03/14/13 11:09 PM      Result Value Range Status   MRSA by PCR NEGATIVE  NEGATIVE Final   Comment:            The GeneXpert MRSA Assay (FDA     approved for NASAL specimens     only), is one component of a     comprehensive MRSA colonization     surveillance program. It is not     intended to diagnose MRSA     infection nor to guide or     monitor treatment for     MRSA infections.     Studies: No results found.  Scheduled Meds: . aspirin EC  325 mg Oral Q breakfast  . carvedilol  6.25 mg  Oral BID WC  . colchicine  0.6 mg Oral BID  . docusate sodium  100 mg Oral BID  . enoxaparin (LOVENOX) injection  40 mg Subcutaneous QHS  . ferric gluconate (FERRLECIT/NULECIT) IV  250 mg Intravenous QODAY  . folic acid  1 mg Oral Daily  . insulin aspart  0-9 Units Subcutaneous TID WC  . lisinopril  5 mg Oral Daily  . multivitamin with minerals  1 tablet Oral Daily  . pantoprazole  40 mg Oral QHS  . senna  1 tablet Oral BID  . spironolactone  25 mg Oral Daily  . thiamine  100 mg Oral Daily  Continuous Infusions:    Principal Problem:   Closed right hip fracture Active Problems:   DM   HYPERLIPIDEMIA   Iron deficiency anemia, unspecified   Chronic systolic heart failure   Tobacco use disorder   Smoker   Alcohol abuse   Cardiomyopathy, nonischemic   Acute blood loss anemia   Abnormal EKG    Time spent: 35 mins    [facility] MD [facility] Pager [phone]. If 7PM-7AM, please contact night-coverage at [email or web address], password [facility] 03/20/2013, 11:30 AM  LOS: 6 days

## 2013-03-21 LAB — GLUCOSE, CAPILLARY
Glucose-Capillary: 113 mg/dL — ABNORMAL HIGH (ref 70–99)
Glucose-Capillary: 121 mg/dL — ABNORMAL HIGH (ref 70–99)
Glucose-Capillary: 125 mg/dL — ABNORMAL HIGH (ref 70–99)

## 2013-03-21 LAB — BASIC METABOLIC PANEL
BUN: 13 mg/dL (ref 6–23)
Chloride: 96 mEq/L (ref 96–112)
Creatinine, Ser: 0.77 mg/dL (ref 0.50–1.35)
GFR calc Af Amer: >90 mL/min (ref >90)
GFR calc non Af Amer: >90 mL/min (ref >90)

## 2013-03-21 LAB — CBC
HCT: 23.7 % — ABNORMAL LOW (ref 39.0–52.0)
Hemoglobin: 8 g/dL — ABNORMAL LOW (ref 13.0–17.0)
MCH: 31.3 pg (ref 26.0–34.0)
MCHC: 33.8 g/dL (ref 30.0–36.0)
RDW: 19.1 % — ABNORMAL HIGH (ref 11.5–15.5)
WBC: 5.5 10*3/uL (ref 4.0–10.5)

## 2013-03-21 MED ORDER — OXYCODONE HCL 5 MG PO TABS
10.0000 mg | ORAL_TABLET | Freq: Once | ORAL | Status: AC
  Administered 2013-03-21: 10 mg via ORAL
  Filled 2013-03-21: qty 2

## 2013-03-21 MED ORDER — OXYCODONE HCL 5 MG PO TABS
5.0000 mg | ORAL_TABLET | ORAL | Status: DC | PRN
  Administered 2013-03-21 – 2013-03-25 (×22): 10 mg via ORAL
  Filled 2013-03-21 (×22): qty 2

## 2013-03-21 NOTE — Progress Notes (Signed)
[facility] PROGRESS NOTE  Winner Courtwright [id number] DOB: 1955-10-12 DOA: 03/14/2013 PCP: Glori Bickers, MD  Assessment/Plan: #1 right hip femoral neck fracture Secondary to mechanical fall. Patient also noted to have an elevated alcohol level on admission. Patient denied any chest pain. No shortness of breath. No syncope. Patient s/p right hip hemiarthroplasty yesterday. Orthopedics is following. Needs SNF.  #2 nonischemic cardiomyopathy Patient with EF of 20-25%. Patient denies any acute chest pain or shortness of breath. Monitor fluid status closely. EKG with a sinus tachycardia. LVH. No significant change from prior EKG. Per med rec patient only on aspirin at home. Per notes from epic from November of 2013 patient is post to be on lisinopril 10 mg daily, Aldactone 25 mg daily, Lasix 40 mg daily, Coreg 6.25 mg twice a day. Lasix on hold per cardiology. Continue current cardiac medications.  #3 chronic systolic heart failure Patient with the EF of 20-25% per echo of 12/28/2010. Per med rec patient only on aspirin at home. Per notes from cardiology outpatient revisit of November 2013 patient is supposed to be on Lasix 40 mg daily, Coreg 6.25 mg twice a day, Aldactone 25 mg daily and lisinopril 10 mg daily. Continue current cardiac meds. Lasix held per cardiology. Cardiology following and appreciate input and recommendations. Monitor volume status closely. Cardiac medication recommendations in cardiology note.   #4 alcohol abuse On admission patient have an elevated alcohol level of 332. Patient on Ativan withdrawal protocol. Continue thiamine. Continue folic acid. Continue multivitamin. Alcohol cessation expressed to patient.  #5 hyperlipidemia LDL is 69. Outpatient f/u.   #6 tobacco abuse Tobacco cessation.  #7 history of type 2 diabetes Hemoglobin A1c = 6. CBGs 113 - 127. Continue sliding scale insulin.  #8 osteoarthritis Pain management.  #9  Hypokalemia Repleted.   #10 abnormal EKG Patient noted to have some T-wave inversion changes on EKG. Patient denies any active chest pain. Patient had cardiac catheterization which was negative in 2012. Per cardiology.  #11 Severe Iron deficiency anemia/acute blood loss anemia Patient currently asymptomatic. Iron levels was less than 10. Continue IV iron. Will likely need oral iron supplementations on discharge. Patient will need further workup as outpatient for his iron deficiency anemia. If patient has not had a colonoscopy done will likely benefit from one.   #11 prophylaxis PPI for GI prophylaxis. SCDs for DVT prophylaxis preoperatively and per orthopedics postoperatively.  Code Status: Full Family Communication: Updated patient at bedside. Disposition Plan:  Awaiting SNF placement.    Consultants:  Orthopedics: Dr. Ninfa Linden 03/15/2013  Cardiology Dr Lovena Le 03/15/13    Procedures:  CT head CT C-spine 03/14/2013  Chest x-ray 03/14/2013  X-ray of the hips and pelvis 03/14/2013  X-ray of the right knee 03/14/2013  X-ray of the L-spine 03/14/2013  R hip hemiarthroplasty 03/15/13   Antibiotics:  None  HPI/Subjective: Patient c/o hip pain. Patient denies any chest pain. No shortness of breath.   Objective: Filed Vitals:   03/21/13 0507  BP: 117/65  Pulse: 79  Temp: 98.1 F (36.7 C)  Resp: 19    Intake/Output Summary (Last 24 hours) at 03/21/13 1029 Last data filed at 03/21/13 0730  Gross per 24 hour  Intake   1560 ml  Output    750 ml  Net    810 ml   Filed Weights   03/14/13 2200 03/19/13 1730 03/20/13 0725  Weight: 77.2 kg (170 lb 3.1 oz) 77.701 kg (171 lb 4.8 oz) 77.973 kg (171 lb 14.4 oz)  Exam:   General:  NAD  Cardiovascular: RRR  Respiratory: CTAB anterior lung fields.  Abdomen: Soft, nontender, nondistended, positive bowel sounds.  Musculoskeletal: No clubbing cyanosis or edema.   Data Reviewed: Basic Metabolic  Panel:  Recent Labs Lab 03/15/13 0430  03/17/13 0624 03/18/13 0507 03/19/13 0525 03/20/13 0825 03/21/13 0500  NA 143  < > 137 133* 133* 133* 133*  K 4.6  < > 4.3 3.5 3.8 4.1 4.1  CL 104  < > 97 95* 96 96 96  CO2 26  < > 28 26 26 27 26   GLUCOSE 87  < > 111* 139* 105* 115* 111*  BUN 13  < > 20 19 14 14 13   CREATININE 0.84  < > 1.22 0.99 0.89 0.89 0.77  CALCIUM 8.2*  < > 9.0 8.6 8.9 8.9 9.0  MG 1.8  --   --   --   --   --   --   < > = values in this interval not displayed. Liver Function Tests:  Recent Labs Lab 03/14/13 1729 03/15/13 0430  AST 24 20  ALT 12 10  ALKPHOS 113 97  BILITOT 1.4* 1.7*  PROT 7.7 6.4  ALBUMIN 4.1 3.3*   No results found for this basename: LIPASE, AMYLASE,  in the last 168 hours No results found for this basename: AMMONIA,  in the last 168 hours CBC:  Recent Labs Lab 03/14/13 1729 03/15/13 0430  03/17/13 0624 03/18/13 0507 03/19/13 0525 03/20/13 0825 03/21/13 0500  WBC 7.1 7.7  < > 7.5 6.4 5.5 5.0 5.5  NEUTROABS 5.3 4.6  --   --   --   --   --   --   HGB 14.8 13.0  < > 10.2* 8.6* 8.1* 7.9* 8.0*  HCT 44.6 39.0  < > 30.6* 25.8* 24.0* 23.7* 23.7*  MCV 89.7 93.8  < > 93.6 92.5 92.0 92.6 92.6  PLT 126* 117*  < > 110* 111* 128* 197 297  < > = values in this interval not displayed. Cardiac Enzymes: No results found for this basename: CKTOTAL, CKMB, CKMBINDEX, TROPONINI,  in the last 168 hours BNP (last 3 results) No results found for this basename: PROBNP,  in the last 8760 hours CBG:  Recent Labs Lab 03/20/13 0653 03/20/13 1052 03/20/13 1616 03/20/13 2142 03/21/13 0637  GLUCAP 99 114* 122* 127* 113*    Recent Results (from the past 240 hour(s))  MRSA PCR SCREENING     Status: None   Collection Time    03/14/13 11:09 PM      Result Value Range Status   MRSA by PCR NEGATIVE  NEGATIVE Final   Comment:            The GeneXpert MRSA Assay (FDA     approved for NASAL specimens     only), is one component of a     comprehensive  MRSA colonization     surveillance program. It is not     intended to diagnose MRSA     infection nor to guide or     monitor treatment for     MRSA infections.     Studies: No results found.  Scheduled Meds: . aspirin EC  325 mg Oral Q breakfast  . carvedilol  6.25 mg Oral BID WC  . docusate sodium  100 mg Oral BID  . enoxaparin (LOVENOX) injection  40 mg Subcutaneous QHS  . ferric gluconate (FERRLECIT/NULECIT) IV  250 mg Intravenous QODAY  .  folic acid  1 mg Oral Daily  . insulin aspart  0-9 Units Subcutaneous TID WC  . lisinopril  5 mg Oral Daily  . multivitamin with minerals  1 tablet Oral Daily  . pantoprazole  40 mg Oral QHS  . senna  1 tablet Oral BID  . spironolactone  25 mg Oral Daily  . thiamine  100 mg Oral Daily   Continuous Infusions:    Principal Problem:   Closed right hip fracture Active Problems:   DM   HYPERLIPIDEMIA   Iron deficiency anemia, unspecified   Chronic systolic heart failure   Tobacco use disorder   Smoker   Alcohol abuse   Cardiomyopathy, nonischemic   Acute blood loss anemia   Abnormal EKG    Time spent: 35 mins    [facility] MD [facility] Pager [phone]. If 7PM-7AM, please contact night-coverage at [email or web address], password [facility] 03/21/2013, 10:29 AM  LOS: 7 days

## 2013-03-21 NOTE — Progress Notes (Signed)
Clinical Social Work Department CLINICAL SOCIAL WORK PLACEMENT NOTE 03/21/2013  Patient:  Travis [name], Travis [name]  Account Number:  [id number] Admit date:  03/14/2013  Clinical Social Worker:  Blima Rich, [place]  Date/time:  03/21/2013 07:03 PM  Clinical Social Work is seeking post-discharge placement for this patient at the following level of care:   SKILLED NURSING   (*CSW will update this form in Epic as items are completed)   03/21/2013  Patient/family provided with [place] Department of Clinical Social Work's list of facilities offering this level of care within the geographic area requested by the patient (or if unable, by the patient's family).  03/21/2013  Patient/family informed of their freedom to choose among providers that offer the needed level of care, that participate in Medicare, Medicaid or managed care program needed by the patient, have an available bed and are willing to accept the patient.  03/21/2013  Patient/family informed of [facility]' ownership interest in [facility], as well as of the fact that they are under no obligation to receive care at this facility.  PASARR submitted to EDS on 03/21/2013 PASARR number received from [employer] on 03/21/2013  FL2 transmitted to all facilities in geographic area requested by pt/family on  03/21/2013 FL2 transmitted to all facilities within larger geographic area on   Patient informed that his/her managed care company has contracts with or will negotiate with  certain facilities, including the following:     Patient/family informed of bed offers received:   Patient chooses bed at  Physician recommends and patient chooses bed at    Patient to be transferred to  on   Patient to be transferred to facility by   The following physician request were entered in Epic:   Additional Comments:

## 2013-03-21 NOTE — Progress Notes (Signed)
Clinical Social Work Department BRIEF PSYCHOSOCIAL ASSESSMENT 03/21/2013  Patient:  BALEY, MCQUAIN     Account Number:  [id number]     Admit date:  03/14/2013  Clinical Social Worker:  Rolinda Roan  Date/Time:  03/21/2013 06:56 PM  Referred by:  Physician  Date Referred:  03/20/2013 Referred for  SNF Placement   Other Referral:   Interview type:  Patient Other interview type:    PSYCHOSOCIAL DATA Living Status:  FAMILY Admitted from facility:   Level of care:   Primary support name:  [name] Peeler [phone] Primary support relationship to patient:  FAMILY Degree of support available:   Renne Crigler is patient's niece. Per patient Danelle Earthly is very supportive and has all his insurance documents.    CURRENT CONCERNS  Other Concerns:    SOCIAL WORK ASSESSMENT / PLAN Clinical Social Worker (CSW) met with patient to discuss SNF placement. Per chart patient does not have insurance. Patient reported that he recently changed from Medicaid to Medicare and his niece Danelle Earthly has all his insurance information. CSW left message for [name] on Saturday 03/21/13. Patient reported that he would get CSW insurance information by Monday. Patient is agreeable to fax out to [place] and [place]. CSW reported that he lives in [place] with his brother Anmol Rhoads and his wife.   Assessment/plan status:  Psychosocial Support/Ongoing Assessment of Needs Other assessment/ plan:   Information/referral to community resources:   CSW gave patient SNF list.    PATIENT'S/FAMILY'S RESPONSE TO PLAN OF CARE: Patient very appreciate of CSW visit.

## 2013-03-22 LAB — GLUCOSE, CAPILLARY
Glucose-Capillary: 104 mg/dL — ABNORMAL HIGH (ref 70–99)
Glucose-Capillary: 117 mg/dL — ABNORMAL HIGH (ref 70–99)
Glucose-Capillary: 113 mg/dL — ABNORMAL HIGH (ref 70–99)
Glucose-Capillary: 118 mg/dL — ABNORMAL HIGH (ref 70–99)

## 2013-03-22 NOTE — Progress Notes (Signed)
[facility] PROGRESS NOTE  Travis [name] [id number] DOB: November 16, 1955 DOA: 03/14/2013 PCP: Glori Bickers, MD  Assessment/Plan: #1 right hip femoral neck fracture Secondary to mechanical fall. Patient also noted to have an elevated alcohol level on admission. Patient denied any chest pain. No shortness of breath. No syncope. Patient s/p right hip hemiarthroplasty y12/26.  -per Orthopedics  -awaiting SNF.  #2 nonischemic cardiomyopathy Patient with EF of 20-25%. Patient denies any acute chest pain or shortness of breath. Monitor fluid status closely. EKG with a sinus tachycardia. LVH. No significant change from prior EKG. Per med rec patient only on aspirin at home. Per notes from epic from November of 2013 patient is post to be on lisinopril 10 mg daily, Aldactone 25 mg daily, Coreg 6.25 mg twice a day. Lasix on hold per cardiology, and Lasix 20 mg daily when necessary weight gain of 3 pounds more recommended. Continue current cardiac medications.  #3 chronic systolic heart failure Patient with the EF of 20-25% per echo of 12/28/2010. Per med rec patient only on aspirin at home. Per notes from cardiology outpatient revisit of November 2013 patient is supposed to be on Lasix 40 mg daily, Coreg 6.25 mg twice a day, Aldactone 25 mg daily and lisinopril 10 mg daily. Continue current cardiac meds. Lasix held per cardiology. Cardiology following and appreciate input and recommendations.  -Monitor volume status closely- no weight documented so far today, daily weights again on her follow and treat with Lasix 20 when necessary weight gain of 3 pounds more as recommended per cardiology. Continue Cardiac medication recommendations in cardiology note.   #4 alcohol abuse On admission patient have an elevated alcohol level of 332. Patient on Ativan withdrawal protocol. Continue thiamine. Continue folic acid. Continue multivitamin.  -Patient was consulted on Alcohol cessation.  #5  hyperlipidemia LDL is 69. Outpatient f/u.   #6 tobacco abuse Tobacco cessation.  #7 history of type 2 diabetes Hemoglobin A1c = 6. CBGs 113 - 127. Continue sliding scale insulin.  #8 osteoarthritis Pain management.  #9 Hypokalemia Repleted.   #10 abnormal EKG Patient noted to have some T-wave inversion changes on EKG. Patient denies any active chest pain. Patient had cardiac catheterization which was negative in 2012. Per cardiology. -Patient remains chest pain-free #11 Severe Iron deficiency anemia/acute blood loss anemia Patient currently asymptomatic. Iron levels was less than 10. Continue IV iron. Will likely need oral iron supplementations on discharge. Patient will need further workup as outpatient for his iron deficiency anemia. If patient has not had a colonoscopy done will likely benefit from one. -Follow up outpatient, hemoglobin stable today.  #11 prophylaxis PPI for GI prophylaxis. SCDs for DVT prophylaxis preoperatively and per orthopedics postoperatively.  Code Status: Full Family Communication: Updated patient at bedside. Disposition Plan:  Awaiting SNF placement.    Consultants:  Orthopedics: Dr. Ninfa Linden 03/15/2013  Cardiology Dr Lovena Le 03/15/13    Procedures:  CT head CT C-spine 03/14/2013  Chest x-ray 03/14/2013  X-ray of the hips and pelvis 03/14/2013  X-ray of the right knee 03/14/2013  X-ray of the L-spine 03/14/2013  R hip hemiarthroplasty 03/15/13   Antibiotics:  None  HPI/Subjective: Patient c/o R. hip pain. denies shortness of breath, and no chest pain..   Objective: Filed Vitals:   03/22/13 0447  BP: 118/66  Pulse: 82  Temp: 98.7 F (37.1 C)  Resp: 18    Intake/Output Summary (Last 24 hours) at 03/22/13 1205 Last data filed at 03/22/13 0800  Gross per 24 hour  Intake  1440 ml  Output    600 ml  Net    840 ml   Filed Weights   03/19/13 1730 03/20/13 0725 03/21/13 2300  Weight: 77.701 kg (171 lb 4.8 oz) 77.973 kg  (171 lb 14.4 oz) 78.8 kg (173 lb 11.6 oz)    Exam:   General:  NAD  Cardiovascular: RRR  Respiratory: CTAB anterior lung fields.  Abdomen: Soft, nontender, nondistended, positive bowel sounds.  Musculoskeletal: No clubbing cyanosis or edema.   Data Reviewed: Basic Metabolic Panel:  Recent Labs Lab 03/17/13 0624 03/18/13 0507 03/19/13 0525 03/20/13 0825 03/21/13 0500  NA 137 133* 133* 133* 133*  K 4.3 3.5 3.8 4.1 4.1  CL 97 95* 96 96 96  CO2 28 26 26 27 26   GLUCOSE 111* 139* 105* 115* 111*  BUN 20 19 14 14 13   CREATININE 1.22 0.99 0.89 0.89 0.77  CALCIUM 9.0 8.6 8.9 8.9 9.0   Liver Function Tests: No results found for this basename: AST, ALT, ALKPHOS, BILITOT, PROT, ALBUMIN,  in the last 168 hours No results found for this basename: LIPASE, AMYLASE,  in the last 168 hours No results found for this basename: AMMONIA,  in the last 168 hours CBC:  Recent Labs Lab 03/17/13 0624 03/18/13 0507 03/19/13 0525 03/20/13 0825 03/21/13 0500  WBC 7.5 6.4 5.5 5.0 5.5  HGB 10.2* 8.6* 8.1* 7.9* 8.0*  HCT 30.6* 25.8* 24.0* 23.7* 23.7*  MCV 93.6 92.5 92.0 92.6 92.6  PLT 110* 111* 128* 197 297   Cardiac Enzymes: No results found for this basename: CKTOTAL, CKMB, CKMBINDEX, TROPONINI,  in the last 168 hours BNP (last 3 results) No results found for this basename: PROBNP,  in the last 8760 hours CBG:  Recent Labs Lab 03/21/13 1103 03/21/13 1608 03/21/13 2234 03/22/13 0714 03/22/13 1056  GLUCAP 121* 125* 106* 104* 117*    Recent Results (from the past 240 hour(s))  MRSA PCR SCREENING     Status: None   Collection Time    03/14/13 11:09 PM      Result Value Range Status   MRSA by PCR NEGATIVE  NEGATIVE Final   Comment:            The GeneXpert MRSA Assay (FDA     approved for NASAL specimens     only), is one component of a     comprehensive MRSA colonization     surveillance program. It is not     intended to diagnose MRSA     infection nor to guide or      monitor treatment for     MRSA infections.     Studies: No results found.  Scheduled Meds: . aspirin EC  325 mg Oral Q breakfast  . carvedilol  6.25 mg Oral BID WC  . docusate sodium  100 mg Oral BID  . enoxaparin (LOVENOX) injection  40 mg Subcutaneous QHS  . ferric gluconate (FERRLECIT/NULECIT) IV  250 mg Intravenous QODAY  . folic acid  1 mg Oral Daily  . insulin aspart  0-9 Units Subcutaneous TID WC  . lisinopril  5 mg Oral Daily  . multivitamin with minerals  1 tablet Oral Daily  . pantoprazole  40 mg Oral QHS  . senna  1 tablet Oral BID  . spironolactone  25 mg Oral Daily  . thiamine  100 mg Oral Daily   Continuous Infusions:    Principal Problem:   Closed right hip fracture Active Problems:   DM  HYPERLIPIDEMIA   Iron deficiency anemia, unspecified   Chronic systolic heart failure   Tobacco use disorder   Smoker   Alcohol abuse   Cardiomyopathy, nonischemic   Acute blood loss anemia   Abnormal EKG    Time spent: 25 mins    Sheila Oats MD [facility] Pager [phone]. If 7PM-7AM, please contact night-coverage at [email or web address], password [facility] 03/22/2013, 12:05 PM  LOS: 8 days

## 2013-03-23 ENCOUNTER — Encounter (HOSPITAL_COMMUNITY): Payer: Self-pay | Admitting: Orthopaedic Surgery

## 2013-03-23 LAB — GLUCOSE, CAPILLARY
Glucose-Capillary: 104 mg/dL — ABNORMAL HIGH (ref 70–99)
Glucose-Capillary: 131 mg/dL — ABNORMAL HIGH (ref 70–99)
Glucose-Capillary: 113 mg/dL — ABNORMAL HIGH (ref 70–99)

## 2013-03-23 NOTE — Progress Notes (Addendum)
[facility] PROGRESS NOTE  Travis [name] [id number] DOB: 23-Jul-1955 DOA: 03/14/2013 PCP: Glori Bickers, MD  Assessment/Plan: #1 right hip femoral neck fracture Secondary to mechanical fall. Patient also noted to have an elevated alcohol level on admission. Patient denied any chest pain. No shortness of breath. No syncope. Patient s/p right hip hemiarthroplasty y12/26.  -per Orthopedics  -awaiting SNF, SW assisting with placement and states possible bed availability in am.  #2 nonischemic cardiomyopathy Patient with EF of 20-25%. Patient denies any acute chest pain or shortness of breath. Monitor fluid status closely. EKG with a sinus tachycardia. LVH. No significant change from prior EKG. Per med rec patient only on aspirin at home. Per notes from epic from November of 2013 patient is post to be on lisinopril 10 mg daily, Aldactone 25 mg daily, Coreg 6.25 mg twice a day. Lasix on hold per cardiology, and Lasix 20 mg daily when necessary weight gain of 3 pounds more recommended. Continue current cardiac medications.  #3 chronic systolic heart failure Patient with the EF of 20-25% per echo of 12/28/2010. Per med rec patient only on aspirin at home. Per notes from cardiology outpatient revisit of November 2013 patient is supposed to be on Lasix 40 mg daily, Coreg 6.25 mg twice a day, Aldactone 25 mg daily and lisinopril 10 mg daily. Continue current cardiac meds. Lasix held per cardiology. Cardiology following and appreciate input and recommendations.  -Monitor volume status closely- no weight documented so far today, daily weights again on her follow and treat with Lasix 20 when necessary weight gain of 3 pounds more as recommended per cardiology. Continue Cardiac medication recommendations in cardiology note.   #4 alcohol abuse On admission patient have an elevated alcohol level of 332. Patient on Ativan withdrawal protocol. Continue thiamine. Continue folic acid. Continue  multivitamin.  -Patient was counseled on Alcohol cessation.  #5 hyperlipidemia LDL is 69. Outpatient f/u.   #6 tobacco abuse Tobacco cessation.  #7 history of type 2 diabetes Hemoglobin A1c = 6. CBGs 113 - 127. Continue sliding scale insulin.  #8 osteoarthritis Pain management.  #9 Hypokalemia Repleted.   #10 abnormal EKG Patient noted to have some T-wave inversion changes on EKG. Patient denies any active chest pain. Patient had cardiac catheterization which was negative in 2012. Per cardiology. -Patient remains chest pain-free  #11 Severe Iron deficiency anemia/acute blood loss anemia Patient currently asymptomatic. Iron levels was less than 10. Pt was given iv during this hospital stay. Will likely need oral iron supplementations on discharge. Patient will need further workup as outpatient for his iron deficiency anemia. If patient has not had a colonoscopy done will likely benefit from one outpt. -Follow up outpatient, hemoglobin stable today.  #11 prophylaxis PPI for GI prophylaxis. SCDs for DVT prophylaxis preoperatively and per orthopedics postoperatively.  Code Status: Full Family Communication: Updated patient at bedside. Disposition Plan:  Awaiting SNF placement.    Consultants:  Orthopedics: Dr. Ninfa Linden 03/15/2013  Cardiology Dr Lovena Le 03/15/13    Procedures:  CT head CT C-spine 03/14/2013  Chest x-ray 03/14/2013  X-ray of the hips and pelvis 03/14/2013  X-ray of the right knee 03/14/2013  X-ray of the L-spine 03/14/2013  R hip hemiarthroplasty 03/15/13   Antibiotics:  None  HPI/Subjective: Patient denies any new c/o, ambulated with PT today  Objective: Filed Vitals:   03/23/13 1317  BP: 104/65  Pulse: 90  Temp: 99.2 F (37.3 C)  Resp: 20    Intake/Output Summary (Last 24 hours) at 03/23/13 1700 Last  data filed at 03/23/13 1300  Gross per 24 hour  Intake    960 ml  Output    600 ml  Net    360 ml   Filed Weights   03/20/13  0725 03/21/13 2300 03/22/13 2300  Weight: 77.973 kg (171 lb 14.4 oz) 78.8 kg (173 lb 11.6 oz) 79.6 kg (175 lb 7.8 oz)    Exam:   General:  NAD  Cardiovascular: RRR  Respiratory: CTAB anterior lung fields.  Abdomen: Soft, nontender, nondistended, positive bowel sounds.  Musculoskeletal: No clubbing cyanosis or edema.   Data Reviewed: Basic Metabolic Panel:  Recent Labs Lab 03/17/13 0624 03/18/13 0507 03/19/13 0525 03/20/13 0825 03/21/13 0500  NA 137 133* 133* 133* 133*  K 4.3 3.5 3.8 4.1 4.1  CL 97 95* 96 96 96  CO2 28 26 26 27 26   GLUCOSE 111* 139* 105* 115* 111*  BUN 20 19 14 14 13   CREATININE 1.22 0.99 0.89 0.89 0.77  CALCIUM 9.0 8.6 8.9 8.9 9.0   Liver Function Tests: No results found for this basename: AST, ALT, ALKPHOS, BILITOT, PROT, ALBUMIN,  in the last 168 hours No results found for this basename: LIPASE, AMYLASE,  in the last 168 hours No results found for this basename: AMMONIA,  in the last 168 hours CBC:  Recent Labs Lab 03/17/13 0624 03/18/13 0507 03/19/13 0525 03/20/13 0825 03/21/13 0500  WBC 7.5 6.4 5.5 5.0 5.5  HGB 10.2* 8.6* 8.1* 7.9* 8.0*  HCT 30.6* 25.8* 24.0* 23.7* 23.7*  MCV 93.6 92.5 92.0 92.6 92.6  PLT 110* 111* 128* 197 297   Cardiac Enzymes: No results found for this basename: CKTOTAL, CKMB, CKMBINDEX, TROPONINI,  in the last 168 hours BNP (last 3 results) No results found for this basename: PROBNP,  in the last 8760 hours CBG:  Recent Labs Lab 03/22/13 1626 03/22/13 2158 03/23/13 0649 03/23/13 1106 03/23/13 1600  GLUCAP 113* 118* 104* 131* 113*    Recent Results (from the past 240 hour(s))  MRSA PCR SCREENING     Status: None   Collection Time    03/14/13 11:09 PM      Result Value Range Status   MRSA by PCR NEGATIVE  NEGATIVE Final   Comment:            The GeneXpert MRSA Assay (FDA     approved for NASAL specimens     only), is one component of a     comprehensive MRSA colonization     surveillance program.  It is not     intended to diagnose MRSA     infection nor to guide or     monitor treatment for     MRSA infections.     Studies: No results found.  Scheduled Meds: . aspirin EC  325 mg Oral Q breakfast  . carvedilol  6.25 mg Oral BID WC  . docusate sodium  100 mg Oral BID  . enoxaparin (LOVENOX) injection  40 mg Subcutaneous QHS  . ferric gluconate (FERRLECIT/NULECIT) IV  250 mg Intravenous QODAY  . folic acid  1 mg Oral Daily  . insulin aspart  0-9 Units Subcutaneous TID WC  . lisinopril  5 mg Oral Daily  . multivitamin with minerals  1 tablet Oral Daily  . pantoprazole  40 mg Oral QHS  . senna  1 tablet Oral BID  . spironolactone  25 mg Oral Daily  . thiamine  100 mg Oral Daily   Continuous Infusions:  Principal Problem:   Closed right hip fracture Active Problems:   DM   HYPERLIPIDEMIA   Iron deficiency anemia, unspecified   Chronic systolic heart failure   Tobacco use disorder   Smoker   Alcohol abuse   Cardiomyopathy, nonischemic   Acute blood loss anemia   Abnormal EKG    Time spent: 25 mins    Sheila Oats MD [facility] Pager [phone]. If 7PM-7AM, please contact night-coverage at [email or web address], password [facility] 03/23/2013, 5:00 PM  LOS: 9 days

## 2013-03-24 LAB — BASIC METABOLIC PANEL
BUN: 12 mg/dL (ref 6–23)
CO2: 30 mEq/L (ref 19–32)
Calcium: 9.3 mg/dL (ref 8.4–10.5)
Creatinine, Ser: 0.89 mg/dL (ref 0.50–1.35)
GFR calc Af Amer: >90 mL/min (ref >90)
GFR calc non Af Amer: >90 mL/min (ref >90)
Glucose, Bld: 114 mg/dL — ABNORMAL HIGH (ref 70–99)
Potassium: 4.3 mEq/L (ref 3.7–5.3)
Sodium: 135 mEq/L — ABNORMAL LOW (ref 137–147)

## 2013-03-24 LAB — GLUCOSE, CAPILLARY: Glucose-Capillary: 121 mg/dL — ABNORMAL HIGH (ref 70–99)

## 2013-03-24 NOTE — Progress Notes (Signed)
Physical Therapy Treatment Patient Details Name: Travis [name] MRN: [id number] DOB: 01/06/1956 Today's Date: 03/24/2013 Time: [id number] PT Time Calculation (min): 23 min  PT Assessment / Plan / Recommendation  History of Present Illness Pt is a 57 y/o male admitted s/p right hip hemiarthroplasty anterior approach   PT Comments   Pt cont's to make progress with mobility.    Follow Up Recommendations  SNF     Does the patient have the potential to tolerate intense rehabilitation     Barriers to Discharge        Equipment Recommendations  Rolling walker with 5" wheels;3in1 (PT)    Recommendations for Other Services    Frequency Min 5X/week   Progress towards PT Goals Progress towards PT goals: Progressing toward goals  Plan Current plan remains appropriate    Precautions / Restrictions Precautions Precautions: Anterior Hip;Fall Precaution Comments: Reviewed precautions with pt and provided handout Restrictions RLE Weight Bearing: Weight bearing as tolerated       Mobility  Bed Mobility Bed Mobility: Supine to Sit;Sitting - Scoot to Edge of Bed Supine to Sit: 5: Supervision;HOB flat;With rails Sitting - Scoot to Edge of Bed: 5: Supervision Transfers Transfers: Sit to Stand;Stand to Sit Sit to Stand: 5: Supervision;With upper extremity assist;From bed Stand to Sit: 5: Supervision;With upper extremity assist;With armrests;To chair/3-in-1 Ambulation/Gait Ambulation/Gait Assistance: 5: Supervision Ambulation Distance (Feet): 500 Feet Assistive device: Rolling walker Ambulation/Gait Assistance Details: Cues for tall posture & to look ahead.   Gait Pattern: Step-through pattern;Decreased stride length Stairs: No Wheelchair Mobility Wheelchair Mobility: No    Exercises Total Joint Exercises Ankle Circles/Pumps: AROM;Both;10 reps Heel Slides: AROM;Strengthening;Right;10 reps Hip ABduction/ADduction: AAROM;Strengthening;Right;10 reps Straight Leg Raises:  AAROM;Strengthening;Right;10 reps Long Arc Quad: AROM;Strengthening;Right;10 reps     PT Goals (current goals can now be found in the care plan section) Acute Rehab PT Goals Patient Stated Goal: go someplace for more rehab then go home PT Goal Formulation: With patient/family Time For Goal Achievement: 03/30/13 Potential to Achieve Goals: Good  Visit Information  Last PT Received On: 03/24/13 Assistance Needed: +1 History of Present Illness: Pt is a 58 y/o male admitted s/p right hip hemiarthroplasty anterior approach    Subjective Data  Patient Stated Goal: go someplace for more rehab then go home   Cognition  Cognition Arousal/Alertness: Awake/alert Behavior During Therapy: WFL for tasks assessed/performed Overall Cognitive Status: Within Functional Limits for tasks assessed    Balance     End of Session PT - End of Session Activity Tolerance: Patient tolerated treatment well Patient left: in chair;with call bell/phone within reach Nurse Communication: Mobility status   GP     [name] 03/24/2013, 2:43 PM  [name], PTA [phone] 03/24/2013

## 2013-03-24 NOTE — Progress Notes (Addendum)
[facility] PROGRESS NOTE  Travis [name] [id number] DOB: 1955-09-13 DOA: 03/14/2013 PCP: Glori Bickers, MD  Assessment/Plan: #1 right hip femoral neck fracture Secondary to mechanical fall. Patient also noted to have an elevated alcohol level on admission. Patient denied any chest pain. No shortness of breath. No syncope. Patient s/p right hip hemiarthroplasty y12/26.  -per Orthopedics  -Per CM today pt to be set up for DC home now instead of SNF for rehab but noted PT and OT still recommending SNF, will further discuss with d/c planning team in am #2 nonischemic cardiomyopathy Patient with EF of 20-25%. Patient denies any acute chest pain or shortness of breath. Monitor fluid status closely. EKG with a sinus tachycardia. LVH. No significant change from prior EKG. Per med rec patient only on aspirin at home. Per notes from epic from November of 2013 patient is post to be on lisinopril 10 mg daily, Aldactone 25 mg daily, Coreg 6.25 mg twice a day. Lasix on hold per cardiology, and Lasix 20 mg daily when necessary weight gain of 3 pounds more recommended. Continue current cardiac medications.  #3 chronic systolic heart failure Patient with the EF of 20-25% per echo of 12/28/2010. Per med rec patient only on aspirin at home. Per notes from cardiology outpatient revisit of November 2013 patient is supposed to be on Lasix 40 mg daily, Coreg 6.25 mg twice a day, Aldactone 25 mg daily and lisinopril 10 mg daily. Continue current cardiac meds. Lasix held per cardiology. Cardiology following and appreciate input and recommendations.  -Monitor volume status closely-Lasix 20 when necessary weight gain of 3 pounds more as recommended per cardiology.weight 78.6-down1 lb from 12/28, continue to follow.  Continue Cardiac medication recommendations in cardiology note.   #4 alcohol abuse On admission patient had an elevated alcohol level of 332. Patient on Ativan withdrawal protocol. Continue thiamine.  Continue folic acid. Continue multivitamin.  -Patient was counseled on Alcohol cessation.  #5 hyperlipidemia LDL is 69. Outpatient f/u.   #6 tobacco abuse Tobacco cessation.  #7 history of type 2 diabetes Hemoglobin A1c = 6. CBGs 113 - 127. Continue sliding scale insulin.  #8 osteoarthritis Pain management.  #9 Hypokalemia Repleted.   #10 abnormal EKG Patient noted to have some T-wave inversion changes on EKG. Patient denies any active chest pain. Patient had cardiac catheterization which was negative in 2012. Per cardiology. -Patient remains chest pain-free  #11 Severe Iron deficiency anemia/acute blood loss anemia Patient currently asymptomatic. Iron levels was less than 10. Pt was given iv during this hospital stay. Will likely need oral iron supplementations on discharge. Patient will need further workup as outpatient for his iron deficiency anemia. If patient has not had a colonoscopy done will likely benefit from one outpt. -Follow up outpatient, hemoglobin stable today.  #11 prophylaxis PPI for GI prophylaxis. SCDs for DVT prophylaxis preoperatively and per orthopedics postoperatively.  Code Status: Full Family Communication: Updated patient at bedside. Disposition Plan:  Awaiting SNF placement.    Consultants:  Orthopedics: Dr. Ninfa Linden 03/15/2013  Cardiology Dr Lovena Le 03/15/13    Procedures:  CT head CT C-spine 03/14/2013  Chest x-ray 03/14/2013  X-ray of the hips and pelvis 03/14/2013  X-ray of the right knee 03/14/2013  X-ray of the L-spine 03/14/2013  R hip hemiarthroplasty 03/15/13   Antibiotics:  None  HPI/Subjective: Patient denies CP and no SOB.  Objective: Filed Vitals:   03/24/13 1349  BP: 109/63  Pulse: 87  Temp: 99.2 F (37.3 C)  Resp: 16  Intake/Output Summary (Last 24 hours) at 03/24/13 1859 Last data filed at 03/24/13 1700  Gross per 24 hour  Intake    360 ml  Output   2000 ml  Net  -1640 ml   Filed Weights    03/21/13 2300 03/22/13 2300 03/24/13 0500  Weight: 78.8 kg (173 lb 11.6 oz) 79.6 kg (175 lb 7.8 oz) 78.6 kg (173 lb 4.5 oz)    Exam:   General:  NAD  Cardiovascular: RRR  Respiratory: CTAB anterior lung fields.  Abdomen: Soft, nontender, nondistended, positive bowel sounds.  Musculoskeletal: No clubbing cyanosis or edema.   Data Reviewed: Basic Metabolic Panel:  Recent Labs Lab 03/18/13 0507 03/19/13 0525 03/20/13 0825 03/21/13 0500 03/24/13 0735  NA 133* 133* 133* 133* 135*  K 3.5 3.8 4.1 4.1 4.3  CL 95* 96 96 96 94*  CO2 26 26 27 26 30   GLUCOSE 139* 105* 115* 111* 114*  BUN 19 14 14 13 12   CREATININE 0.99 0.89 0.89 0.77 0.89  CALCIUM 8.6 8.9 8.9 9.0 9.3   Liver Function Tests: No results found for this basename: AST, ALT, ALKPHOS, BILITOT, PROT, ALBUMIN,  in the last 168 hours No results found for this basename: LIPASE, AMYLASE,  in the last 168 hours No results found for this basename: AMMONIA,  in the last 168 hours CBC:  Recent Labs Lab 03/18/13 0507 03/19/13 0525 03/20/13 0825 03/21/13 0500  WBC 6.4 5.5 5.0 5.5  HGB 8.6* 8.1* 7.9* 8.0*  HCT 25.8* 24.0* 23.7* 23.7*  MCV 92.5 92.0 92.6 92.6  PLT 111* 128* 197 297   Cardiac Enzymes: No results found for this basename: CKTOTAL, CKMB, CKMBINDEX, TROPONINI,  in the last 168 hours BNP (last 3 results) No results found for this basename: PROBNP,  in the last 8760 hours CBG:  Recent Labs Lab 03/23/13 1600 03/23/13 2024 03/24/13 0653 03/24/13 1130 03/24/13 1658  GLUCAP 113* 158* 111* 128* 140*    Recent Results (from the past 240 hour(s))  MRSA PCR SCREENING     Status: None   Collection Time    03/14/13 11:09 PM      Result Value Range Status   MRSA by PCR NEGATIVE  NEGATIVE Final   Comment:            The GeneXpert MRSA Assay (FDA     approved for NASAL specimens     only), is one component of a     comprehensive MRSA colonization     surveillance program. It is not     intended to  diagnose MRSA     infection nor to guide or     monitor treatment for     MRSA infections.     Studies: No results found.  Scheduled Meds: . aspirin EC  325 mg Oral Q breakfast  . carvedilol  6.25 mg Oral BID WC  . docusate sodium  100 mg Oral BID  . enoxaparin (LOVENOX) injection  40 mg Subcutaneous QHS  . ferric gluconate (FERRLECIT/NULECIT) IV  250 mg Intravenous QODAY  . folic acid  1 mg Oral Daily  . insulin aspart  0-9 Units Subcutaneous TID WC  . lisinopril  5 mg Oral Daily  . multivitamin with minerals  1 tablet Oral Daily  . pantoprazole  40 mg Oral QHS  . senna  1 tablet Oral BID  . spironolactone  25 mg Oral Daily  . thiamine  100 mg Oral Daily   Continuous Infusions:    Principal  Problem:   Closed right hip fracture Active Problems:   DM   HYPERLIPIDEMIA   Iron deficiency anemia, unspecified   Chronic systolic heart failure   Tobacco use disorder   Smoker   Alcohol abuse   Cardiomyopathy, nonischemic   Acute blood loss anemia   Abnormal EKG    Time spent: 25 mins    Sheila Oats MD [facility] Pager [phone]. If 7PM-7AM, please contact night-coverage at [email or web address], password [facility] 03/24/2013, 6:59 PM  LOS: 10 days

## 2013-03-24 NOTE — Progress Notes (Signed)
Occupational Therapy Treatment Patient Details Name: Travis [name] MRN: [id number] DOB: 17-Jan-1956 Today's Date: 03/24/2013 Time: [id number] OT Time Calculation (min): 38 min  OT Assessment / Plan / Recommendation  History of present illness Pt is a 57 y/o male admitted s/p right hip hemiarthroplasty anterior approach   OT comments  Patient performed bath and dress at sink to include gather his clothes then clean up afterwards.  Focused on walker safety during BADL tasks and adhering to anterior hip precautions.  Precaution handout not found in room therefore another one was provided.    Follow Up Recommendations  SNF    Frequency Min 2X/week   Progress towards OT Goals Progress towards OT goals: Progressing toward goals  Plan Discharge plan remains appropriate    Precautions / Restrictions Precautions Precautions: Anterior Hip;Fall Precaution Comments: Reviewed precautions with pt and provided handout Restrictions RLE Weight Bearing: Weight bearing as tolerated   Pertinent Vitals/Pain 6/10, RN notified and medication provided, rest and repositioned    ADL  Grooming: Supervision/safety Where Assessed - Grooming: Unsupported standing Upper Body Bathing: Performed;Set up Where Assessed - Upper Body Bathing: Unsupported sitting Lower Body Bathing: Performed;Supervision/safety Where Assessed - Lower Body Bathing: Unsupported sitting;Supported standing;Unsupported standing;Supported sit to stand Upper Body Dressing: Performed;Set up Where Assessed - Upper Body Dressing: Unsupported sitting Lower Body Dressing: Supervision/safety Where Assessed - Lower Body Dressing: Unsupported sitting;Supported standing;Unsupported standing;Supported sit to stand Toilet Transfer: Simulated;Supervision/safety Transfers/Ambulation Related to ADLs: Supervision with RW for functional mobility and walker safety during BADL tasks ADL Comments: Can wash feet, don/doff socks, underwear and pants by  bending forward while seated in on 3 in 1 at sink without need for AE     OT Goals(current goals can now be found in the care plan section) Acute Rehab OT Goals Patient Stated Goal: go someplace for more rehab then go home OT Goal Formulation: With patient Time For Goal Achievement: 03/30/13 Potential to Achieve Goals: Good  Visit Information  Last OT Received On: 03/24/13 Assistance Needed: +1 History of Present Illness: Pt is a 57 y/o male admitted s/p right hip hemiarthroplasty anterior approach          Cognition  Cognition Arousal/Alertness: Awake/alert Behavior During Therapy: WFL for tasks assessed/performed Overall Cognitive Status: Within Functional Limits for tasks assessed    Mobility  Transfers Sit to Stand: With upper extremity assist;With armrests;5: Supervision;From chair/3-in-1 Stand to Sit: With upper extremity assist;With armrests;To chair/3-in-1;5: Supervision    End of Session OT - End of Session Equipment Utilized During Treatment: Rolling walker Activity Tolerance: Patient tolerated treatment well Patient left: in chair;with call bell/phone within reach  Travis [name], Travis [name] 03/24/2013, 12:10 PM

## 2013-03-24 NOTE — Care Management Note (Signed)
CARE MANAGEMENT NOTE 03/24/2013  Patient:  Travis [name],Travis [name]   Account Number:  [id number]  Date Initiated:  03/16/2013  Documentation initiated by:  Ricki Miller  Subjective/Objective Assessment:   57 yr old male s/p right hip hemiarthroplasty.     Action/Plan:   Per PT patient will require SNF for shortterm rehab. Social worker is aware.   Anticipated DC Date:  03/17/2013   Anticipated DC Plan:  [place]  In-house referral  Clinical Social Worker      DC Planning Services  CM consult      [facility] Choice  HOME HEALTH  DURABLE MEDICAL EQUIPMENT   Choice offered to / List presented to:     DME arranged  [place]  3-N-1      DME agency  [place] arranged  [place].   Status of service:  Completed, signed off Medicare Important Message given?   (If response is "NO", the following Medicare IM given date fields will be blank) Date Medicare IM given:   Date Additional Medicare IM given:    Discharge Disposition:  [place]  Per UR Regulation:

## 2013-03-25 LAB — BASIC METABOLIC PANEL
BUN: 11 mg/dL (ref 6–23)
CO2: 28 mEq/L (ref 19–32)
Chloride: 94 mEq/L — ABNORMAL LOW (ref 96–112)
GFR calc Af Amer: >90 mL/min (ref >90)
Potassium: 4.3 mEq/L (ref 3.7–5.3)
Sodium: 134 mEq/L — ABNORMAL LOW (ref 137–147)

## 2013-03-25 LAB — GLUCOSE, CAPILLARY: Glucose-Capillary: 114 mg/dL — ABNORMAL HIGH (ref 70–99)

## 2013-03-25 MED ORDER — IRON 325 (65 FE) MG PO TABS: 325.0000 mg | ORAL_TABLET | Freq: Two times a day (BID) | ORAL | Status: DC

## 2013-03-25 MED ORDER — FOLIC ACID 1 MG PO TABS: 1.0000 mg | ORAL_TABLET | Freq: Every day | ORAL | Status: DC

## 2013-03-25 MED ORDER — DSS 100 MG PO CAPS: 100.0000 mg | ORAL_CAPSULE | Freq: Two times a day (BID) | ORAL | Status: DC

## 2013-03-25 MED ORDER — SPIRONOLACTONE 25 MG PO TABS: 25.0000 mg | ORAL_TABLET | Freq: Every day | ORAL | Status: DC

## 2013-03-25 MED ORDER — THIAMINE HCL 100 MG PO TABS: 100.0000 mg | ORAL_TABLET | Freq: Every day | ORAL | Status: DC

## 2013-03-25 MED ORDER — ADULT MULTIVITAMIN W/MINERALS CH: 1.0000 | ORAL_TABLET | Freq: Every day | ORAL | Status: DC

## 2013-03-25 MED ORDER — SENNA 8.6 MG PO TABS: 30.0000 | ORAL_TABLET | Freq: Two times a day (BID) | ORAL | Status: DC | PRN

## 2013-03-25 MED ORDER — CARVEDILOL 6.25 MG PO TABS: 6.2500 mg | ORAL_TABLET | Freq: Two times a day (BID) | ORAL | Status: DC

## 2013-03-25 MED ORDER — COLCHICINE 0.6 MG PO TABS: 0.6000 mg | ORAL_TABLET | Freq: Two times a day (BID) | ORAL | Status: DC | PRN

## 2013-03-25 MED ORDER — LISINOPRIL 5 MG PO TABS: 5.0000 mg | ORAL_TABLET | Freq: Every day | ORAL | Status: DC

## 2013-03-25 NOTE — Progress Notes (Signed)
Reviewed amendment to [place] with change in dc plan to home with assist;  Agree with changes as outlined in [place], PTA's note;  Thanks,  [place], [place]

## 2013-03-25 NOTE — Care Management Note (Signed)
03/25/13 12:53pm  CM provided patient with [facility] program letter. Ricki Miller, RN BSN Case Manager

## 2013-03-25 NOTE — Progress Notes (Signed)
Physical Therapy Treatment Patient Details Name: Travis [name] MRN: [id number] DOB: Oct 28, 1955 Today's Date: 03/25/2013 Time: [id number] PT Time Calculation (min): 23 min  PT Assessment / Plan / Recommendation  History of Present Illness Pt is a 57 y/o male admitted s/p right hip hemiarthroplasty anterior approach   PT Comments   Overall pt moving well.  Does require ocassional cueing for walker safety with activity but presents at supervision level for mobility.  Due to progress this clinician feels pt would be appropriate to d/c home with HHPT & supervision for mobility.  Pt states he will have adequate assistance at home.  D/c plans updated.     Follow Up Recommendations  Home health PT;Supervision for mobility/OOB     Does the patient have the potential to tolerate intense rehabilitation     Barriers to Discharge        Equipment Recommendations  Rolling walker with 5" wheels;3in1 (PT)    Recommendations for Other Services    Frequency Min 5X/week   Progress towards PT Goals Progress towards PT goals: Progressing toward goals  Plan Discharge plan needs to be updated    Precautions / Restrictions Precautions Precautions: Anterior Hip;Fall Restrictions RLE Weight Bearing: Weight bearing as tolerated       Mobility  Bed Mobility Bed Mobility: Supine to Sit;Sitting - Scoot to Edge of Bed Supine to Sit: 6: Modified independent (Device/Increase time);HOB flat Sitting - Scoot to Edge of Bed: 6: Modified independent (Device/Increase time) Transfers Transfers: Sit to Stand;Stand to Sit Sit to Stand: 5: Supervision;With upper extremity assist;From bed Stand to Sit: 6: Modified independent (Device/Increase time);With upper extremity assist;With armrests;To chair/3-in-1 Details for Transfer Assistance: Cues for safety with RW Ambulation/Gait Ambulation/Gait Assistance: 5: Supervision Ambulation Distance (Feet): 500 Feet Assistive device: Rolling walker Ambulation/Gait  Assistance Details: cont's to require cueing to stay closer to RW & for tall posture.   Gait Pattern: Step-through pattern Stairs: Yes Stairs Assistance: 4: Min guard Stairs Assistance Details (indicate cue type and reason): cues for sequencing & technique.   Stair Management Technique: One rail Left;Step to pattern;Forwards Number of Stairs: 5      PT Goals (current goals can now be found in the care plan section) Acute Rehab PT Goals PT Goal Formulation: With patient/family Time For Goal Achievement: 03/30/13 Potential to Achieve Goals: Good  Visit Information  Last PT Received On: 03/25/13 Assistance Needed: +1 History of Present Illness: Pt is a 57 y/o male admitted s/p right hip hemiarthroplasty anterior approach    Subjective Data      Cognition  Cognition Arousal/Alertness: Awake/alert Behavior During Therapy: WFL for tasks assessed/performed Overall Cognitive Status: Within Functional Limits for tasks assessed    Balance     End of Session PT - End of Session Activity Tolerance: Patient tolerated treatment well Patient left: in chair;with call bell/phone within reach Nurse Communication: Mobility status   GP     [name] 03/25/2013, 9:30 AM   [name], PTA [phone] 03/25/2013

## 2013-03-25 NOTE — Progress Notes (Signed)
Occupational Therapy Treatment Patient Details Name: Travis [name] MRN: [id number] DOB: 07-07-1955 Today's Date: 03/25/2013 Time: [id number] OT Time Calculation (min): 10 min  OT Assessment / Plan / Recommendation  History of present illness Pt is a 57 y/o male admitted s/p right hip hemiarthroplasty anterior approach   OT comments  CM requested OT to supply AE (Medicaid) now that patient is going home with intermittent supervision instead of SNF. Instruction provided for use of AE. Reviewed recommendation for 24/7 supervision-unsure patient will have this.     Follow Up Recommendations  Home health OT    Barriers to Discharge  Recommend 24/7 supervision-unsure patient has this  Equipment Recommendations  3 in 1 bedside comode    Progress towards OT Goals Progress towards OT goals: Progressing toward goals  Plan Discharge plan needs to be updated    Precautions / Restrictions Precautions Precautions: Anterior Hip;Fall Restrictions RLE Weight Bearing: Weight bearing as tolerated   Pertinent Vitals/Pain Denies pain    OT Goals(current goals can now be found in the care plan section) Acute Rehab OT Goals Patient Stated Goal: want to go home, "I'll be safe, I've used a walker before"  Visit Information  Last OT Received On: 03/25/13 Assistance Needed: +1 History of Present Illness: Pt is a 57 y/o male admitted s/p right hip hemiarthroplasty anterior approach    Cognition  Cognition Arousal/Alertness: Awake/alert Behavior During Therapy: WFL for tasks assessed/performed Overall Cognitive Status: Within Functional Limits for tasks assessed    End of Session OT - End of Session Patient left: in chair;with call bell/phone within reach    [facility] [place], [place] 03/25/2013, 10:09 AM

## 2013-03-25 NOTE — Discharge Summary (Signed)
Physician Discharge Summary  Travis [name] [id number] DOB: November 10, 1955 DOA: 03/14/2013  PCP: Glori Bickers, MD  Admit date: 03/14/2013 Discharge date: 03/25/2013  Time spent:>71minutes  Recommendations for Outpatient Follow-up:  Follow-up Information   Follow up with [name], MD. Schedule an appointment as soon as possible for a visit in 2 weeks.   Specialty:  Orthopedic Surgery   Contact information:   [place] [address] [phone]       Follow up with Glori Bickers, MD. (In 1 week, call for appointment upon discharge .)    Specialty:  Cardiology   Contact information:   [place] [address] [phone]       Discharge Diagnoses:  Principal Problem:   Closed right hip fracture Active Problems:   DM   HYPERLIPIDEMIA   Iron deficiency anemia, unspecified   Chronic systolic heart failure   Tobacco use disorder   Smoker   Alcohol abuse   Cardiomyopathy, nonischemic   Acute blood loss anemia   Abnormal EKG   Discharge Condition: Improved/stable  Diet recommendation: Low sodium diet  Filed Weights   03/22/13 2300 03/24/13 0500 03/25/13 0706  Weight: 79.6 kg (175 lb 7.8 oz) 78.6 kg (173 lb 4.5 oz) 80.1 kg (176 lb 9.4 oz)    History of present illness:  Travis [name] is a 57 y.o. male with Past medical history of chronic systolic heart failure due to nonischemic cardiomyopathy with EF of 20-25%, dyslipidemia, GI bleed, history of diabetes mellitus, alcohol abuse.  The patient is coming from home  The patient appears to be poor historian due to influence of alcohol and the family members were present does not know the history therefore the history has been obtained from the ED physician as well as EMS documentation.  Abdomen the patient has been drinking since this morning unknown amount of beer, which patient mentions he was drinking "2x 40"and then he was out with his friends walking  while he fell on the back, it is unclear whether it was a mechanical fall, patient mentions he slipped down.  Pt denies any fever, chills, headache, cough, chest pain, palpitation, shortness of breath, orthopnea, PND, nausea, vomiting, abdominal pain, diarrhea, constipation, active bleeding, burning urination, dizziness, pedal edema, focal neurological deficit.   Hospital Course:  #1 right hip femoral neck fracture  Secondary to mechanical fall. Patient also noted to have an elevated alcohol level on admission. Patient denied any chest pain. No shortness of breath. No syncope. Upon admission orthopedics was consulted and patient had right hip hemiarthroplasty on12/26. PT/OT was consulted and followed patient and  recommended a skilled nursing, and continue to work with patient and during the hospital stay. Followup today patient states he will have the 24 7 assistance that he needs at home and he does not want to go to nursing facility he was to be discharged home. He has been set up for home health PT OT and will be discharged home and is to followup with orthopedics outpatient directed.  #2 nonischemic cardiomyopathy  Patient with EF of 20-25%. Patient denies any acute chest pain or shortness of breath. Monitor fluid status closely. EKG with a sinus tachycardia. LVH. No significant change from prior EKG.  -Please see as discussed #3 below, his to followup outpatient with cardiology  #3 chronic systolic heart failure  Patient with the EF of 20-25% per echo of 12/28/2010. Per med rec patient only on aspirin at home. Per notes from cardiology  outpatient revisit of November 2013 Lasix 40 mg daily , Coreg 6.25 mg twice a day, Aldactone 25 mg daily and lisinopril 10 mg daily.  Cardiology was consulted and followed patient in the hospital adjusted his medications a and recommended that he be discharged on carvedilol 6.25 twice a day, lisinopril 10 mg daily, spironolactone 25 mg daily and Lasix 20 when  necessary weight gain of 3 pounds more. He has remained compensated and will be discharged at this time to follow up outpatient with cardiology. #4 alcohol abuse  On admission patient had an elevated alcohol level of 332. Patient on Ativan withdrawal protocol. Continue thiamine. Continue folic acid. Continue multivitamin.  -Patient was counseled on Alcohol cessation.  #5 hyperlipidemia  LDL is 69. Outpatient f/u.  #6 tobacco abuse  Was counseled on Tobacco cessation.  #7 history of type 2 diabetes  Hemoglobin A1c = 6. CBGs 113 - 127. Continue sliding scale insulin.  #8 osteoarthritis  Pain management.  #9 Hypokalemia  Repleted.  #10 abnormal EKG  Patient noted to have some T-wave inversion changes on EKG. Patient denies any active chest pain. Patient had cardiac catheterization which was negative in 2012. Per cardiology.  -Patient remains chest pain-free. His to follow up outpatient with cardiology.  #11 Severe Iron deficiency anemia/acute blood loss anemia  Patient currently asymptomatic. Iron levels was less than 10. Pt was given iv during this hospital stay. His been discharged on oral iron supplementation. Patient will need further workup as outpatient for his iron deficiency anemia. She is to followup with his PCP for referral to GI for possible colonoscopy outpatient.    Consultants:  Orthopedics: Dr. Ninfa Linden 03/15/2013  Cardiology Dr Lovena Le 03/15/13  Procedures:  CT head CT C-spine 03/14/2013  Chest x-ray 03/14/2013  X-ray of the hips and pelvis 03/14/2013  X-ray of the right knee 03/14/2013  X-ray of the L-spine 03/14/2013  R hip hemiarthroplasty 03/15/13     Discharge Exam: Filed Vitals:   03/25/13 0550  BP: 109/69  Pulse: 74  Temp: 98.5 F (36.9 C)  Resp: 16    Exam:  General: NAD  Cardiovascular: RRR  Respiratory: CTAB anterior lung fields.  Abdomen: Soft, nontender, nondistended, positive bowel sounds.  Musculoskeletal: No clubbing cyanosis or edema.     Discharge Instructions  Discharge Orders   Future Orders Complete By Expires   Diet - low sodium heart healthy  As directed    Discharge instructions  As directed    Comments:     Daily weights, and take Lasix as directed for weight gain of 3 pounds or more.   Full weight bearing  As directed    Questions:     Laterality:     Extremity:     Increase activity slowly  As directed        Medication List    STOP taking these medications       BAYER BACK & BODY PAIN EX ST PO      TAKE these medications       aspirin EC 325 MG tablet  Take 1 tablet (325 mg total) by mouth 2 (two) times daily after a meal.     carvedilol 6.25 MG tablet  Commonly known as:  COREG  Take 1 tablet (6.25 mg total) by mouth 2 (two) times daily with a meal.     colchicine 0.6 MG tablet  Take 1 tablet (0.6 mg total) by mouth 2 (two) times daily as needed (gout symptoms).  DSS 100 MG Caps  Take 100 mg by mouth 2 (two) times daily.     folic acid 1 MG tablet  Commonly known as:  FOLVITE  Take 1 tablet (1 mg total) by mouth daily.     lisinopril 5 MG tablet  Commonly known as:  PRINIVIL,ZESTRIL  Take 1 tablet (5 mg total) by mouth daily.     multivitamin with minerals Tabs tablet  Take 1 tablet by mouth daily.     oxyCODONE 5 MG immediate release tablet  Commonly known as:  ROXICODONE  Take 1 tablet (5 mg total) by mouth every 4 (four) hours as needed for severe pain.     senna 8.6 MG Tabs tablet  Commonly known as:  SENOKOT  Take 30 tablets (258 mg total) by mouth 2 (two) times daily as needed for mild constipation.     spironolactone 25 MG tablet  Commonly known as:  ALDACTONE  Take 1 tablet (25 mg total) by mouth daily.     thiamine 100 MG tablet  Take 1 tablet (100 mg total) by mouth daily.       iron 325 mg one tablet by mouth twice a day   No Known Allergies     Follow-up Information   Follow up with [name], MD. Schedule an appointment as soon as  possible for a visit in 2 weeks.   Specialty:  Orthopedic Surgery   Contact information:   [place] [address] [phone]       Follow up with Glori Bickers, MD. (In 1 week, call for appointment upon discharge .)    Specialty:  Cardiology   Contact information:   [address] [place] [address] [phone]        The results of significant diagnostics from this hospitalization (including imaging, microbiology, ancillary and laboratory) are listed below for reference.    Significant Diagnostic Studies: Dg Chest 1 View  03/14/2013   CLINICAL DATA:  Fall  EXAM: CHEST - 1 VIEW  COMPARISON:  05/29/2012  FINDINGS: Lungs are essentially clear. No focal consolidation. No pleural effusion or pneumothorax.  The heart is normal in size.  Degenerative changes of the right shoulder.  IMPRESSION: No evidence of acute cardiopulmonary disease.   Electronically Signed   By: Julian Hy M.D.   On: 03/14/2013 18:29   Dg Lumbar Spine 2-3 Views  03/14/2013   CLINICAL DATA:  Fall, low back pain  EXAM: LUMBAR SPINE - 2-3 VIEW  COMPARISON:  None.  FINDINGS: Five lumbar type vertebral bodies.  Straightening of the lumbar spine.  No evidence of fracture dislocation. Vertebral body heights are maintained.  Moderate degenerative changes of the lower lumbar spine.  Visualized bony pelvis appears intact.  IMPRESSION: No fracture or dislocation is seen.   Electronically Signed   By: Julian Hy M.D.   On: 03/14/2013 18:27   Dg Hip Bilateral W/pelvis  03/14/2013   CLINICAL DATA:  Fall, right hip pain  EXAM: BILATERAL HIP WITH PELVIS - 4+ VIEW  COMPARISON:  None.  FINDINGS: Subcapital right hip fracture with foreshortening and varus angulation.  Left hip is intact.  Visualized bony pelvis is unremarkable.  Mild degenerative changes of the lower lumbar spine.  IMPRESSION: Subcapital right hip fracture, as described above.   Electronically Signed   By:  Julian Hy M.D.   On: 03/14/2013 18:27   Ct Head Wo Contrast  03/14/2013   CLINICAL DATA:  57 year old male  with fall, headache and neck pain.  EXAM: CT HEAD WITHOUT CONTRAST  CT CERVICAL SPINE WITHOUT CONTRAST  TECHNIQUE: Multidetector CT imaging of the head and cervical spine was performed following the standard protocol without intravenous contrast. Multiplanar CT image reconstructions of the cervical spine were also generated.  COMPARISON:  None.  FINDINGS: CT HEAD FINDINGS  Mild generalized cerebral volume loss noted.  No acute intracranial abnormalities are identified, including mass lesion or mass effect, hydrocephalus, extra-axial fluid collection, midline shift, hemorrhage, or acute infarction. The visualized bony calvarium is unremarkable.  CT CERVICAL SPINE FINDINGS  Normal alignment is noted.  There is no evidence of acute fracture, subluxation or prevertebral soft tissue swelling.  Moderate degenerative disc disease/spondylosis is identified from C4-C7. With multilevel facet arthropathy, mild to moderate central spinal and biforaminal narrowing is identified from C4-C7.  No focal bony lesions are identified.  IMPRESSION: No evidence of acute intracranial abnormality or static evidence of acute injury to the cervical spine.  Moderate degenerative changes from C4-C7 with mild-to-moderate central spinal and foraminal narrowing.   Electronically Signed   By: Hassan Rowan M.D.   On: 03/14/2013 18:52   Ct Cervical Spine Wo Contrast  03/14/2013   CLINICAL DATA:  57 year old male with fall, headache and neck pain.  EXAM: CT HEAD WITHOUT CONTRAST  CT CERVICAL SPINE WITHOUT CONTRAST  TECHNIQUE: Multidetector CT imaging of the head and cervical spine was performed following the standard protocol without intravenous contrast. Multiplanar CT image reconstructions of the cervical spine were also generated.  COMPARISON:  None.  FINDINGS: CT HEAD FINDINGS  Mild generalized cerebral volume loss noted.  No  acute intracranial abnormalities are identified, including mass lesion or mass effect, hydrocephalus, extra-axial fluid collection, midline shift, hemorrhage, or acute infarction. The visualized bony calvarium is unremarkable.  CT CERVICAL SPINE FINDINGS  Normal alignment is noted.  There is no evidence of acute fracture, subluxation or prevertebral soft tissue swelling.  Moderate degenerative disc disease/spondylosis is identified from C4-C7. With multilevel facet arthropathy, mild to moderate central spinal and biforaminal narrowing is identified from C4-C7.  No focal bony lesions are identified.  IMPRESSION: No evidence of acute intracranial abnormality or static evidence of acute injury to the cervical spine.  Moderate degenerative changes from C4-C7 with mild-to-moderate central spinal and foraminal narrowing.   Electronically Signed   By: Hassan Rowan M.D.   On: 03/14/2013 18:52   Dg Pelvis Portable  03/15/2013   CLINICAL DATA:  Postop right total hip  EXAM: PORTABLE PELVIS 1-2 VIEWS  COMPARISON:  03/14/2013  FINDINGS: Right total hip arthroplasty in satisfactory position.  Associated subcutaneous gas.  Overlying skin staples.  No fracture or dislocation is seen.  Visualized bony pelvis appears intact.  IMPRESSION: Satisfactory appearance status post right total hip arthroplasty.   Electronically Signed   By: Julian Hy M.D.   On: 03/15/2013 18:56   Dg Knee Complete 4 Views Right  03/14/2013   CLINICAL DATA:  Fall, right knee pain  EXAM: RIGHT KNEE - COMPLETE 4+ VIEW  COMPARISON:  05/29/2012  FINDINGS: No fracture or dislocation is seen.  Moderate tricompartmental degenerative changes.  Small suprapatellar knee joint effusion.  IMPRESSION: No fracture or dislocation is seen.  Moderate degenerative changes. Small suprapatellar knee joint effusion.   Electronically Signed   By: Julian Hy M.D.   On: 03/14/2013 18:28    Microbiology: No results found for this or any previous visit (from the  past 240 hour(s)).   Labs: Basic Metabolic  Panel:  Recent Labs Lab 03/19/13 0525 03/20/13 0825 03/21/13 0500 03/24/13 0735 03/25/13 0630  NA 133* 133* 133* 135* 134*  K 3.8 4.1 4.1 4.3 4.3  CL 96 96 96 94* 94*  CO2 26 27 26 30 28   GLUCOSE 105* 115* 111* 114* 121*  BUN 14 14 13 12 11   CREATININE 0.89 0.89 0.77 0.89 0.86  CALCIUM 8.9 8.9 9.0 9.3 9.0   Liver Function Tests: No results found for this basename: AST, ALT, ALKPHOS, BILITOT, PROT, ALBUMIN,  in the last 168 hours No results found for this basename: LIPASE, AMYLASE,  in the last 168 hours No results found for this basename: AMMONIA,  in the last 168 hours CBC:  Recent Labs Lab 03/19/13 0525 03/20/13 0825 03/21/13 0500  WBC 5.5 5.0 5.5  HGB 8.1* 7.9* 8.0*  HCT 24.0* 23.7* 23.7*  MCV 92.0 92.6 92.6  PLT 128* 197 297   Cardiac Enzymes: No results found for this basename: CKTOTAL, CKMB, CKMBINDEX, TROPONINI,  in the last 168 hours BNP: BNP (last 3 results) No results found for this basename: PROBNP,  in the last 8760 hours CBG:  Recent Labs Lab 03/24/13 1130 03/24/13 1658 03/24/13 2126 03/25/13 0706 03/25/13 1105  GLUCAP 128* 140* 121* 133* 114*       Signed:  Triana Coover C  [facility] 03/25/2013, 12:17 PM

## 2013-03-27 ENCOUNTER — Emergency Department: Payer: Self-pay | Admitting: Emergency Medicine

## 2013-03-27 DIAGNOSIS — M1710 Unilateral primary osteoarthritis, unspecified knee: Secondary | ICD-10-CM | POA: Diagnosis not present

## 2013-03-27 DIAGNOSIS — M109 Gout, unspecified: Secondary | ICD-10-CM | POA: Diagnosis not present

## 2013-03-27 DIAGNOSIS — IMO0002 Reserved for concepts with insufficient information to code with codable children: Secondary | ICD-10-CM | POA: Diagnosis not present

## 2013-03-30 DIAGNOSIS — S72033A Displaced midcervical fracture of unspecified femur, initial encounter for closed fracture: Secondary | ICD-10-CM | POA: Diagnosis not present

## 2013-04-19 ENCOUNTER — Emergency Department: Payer: Self-pay | Admitting: Internal Medicine

## 2013-04-19 DIAGNOSIS — M25569 Pain in unspecified knee: Secondary | ICD-10-CM | POA: Diagnosis not present

## 2013-04-19 DIAGNOSIS — G8929 Other chronic pain: Secondary | ICD-10-CM | POA: Diagnosis not present

## 2013-04-19 DIAGNOSIS — F17200 Nicotine dependence, unspecified, uncomplicated: Secondary | ICD-10-CM | POA: Diagnosis not present

## 2013-04-19 DIAGNOSIS — Z96649 Presence of unspecified artificial hip joint: Secondary | ICD-10-CM | POA: Diagnosis not present

## 2013-04-19 DIAGNOSIS — E119 Type 2 diabetes mellitus without complications: Secondary | ICD-10-CM | POA: Diagnosis not present

## 2013-04-19 LAB — CBC
HCT: 34.9 % — AB (ref 40.0–52.0)
HGB: 11.4 g/dL — AB (ref 13.0–18.0)
MCH: 29.7 pg (ref 26.0–34.0)
MCHC: 32.6 g/dL (ref 32.0–36.0)
MCV: 91 fL (ref 80–100)
Platelet: 375 10*3/uL (ref 150–440)
RBC: 3.83 10*6/uL — ABNORMAL LOW (ref 4.40–5.90)
RDW: 20.1 % — ABNORMAL HIGH (ref 11.5–14.5)
WBC: 6.8 10*3/uL (ref 3.8–10.6)

## 2013-04-27 DIAGNOSIS — M1710 Unilateral primary osteoarthritis, unspecified knee: Secondary | ICD-10-CM | POA: Diagnosis not present

## 2013-05-25 DIAGNOSIS — M7989 Other specified soft tissue disorders: Secondary | ICD-10-CM | POA: Diagnosis not present

## 2013-05-25 DIAGNOSIS — M109 Gout, unspecified: Secondary | ICD-10-CM | POA: Diagnosis not present

## 2013-05-25 DIAGNOSIS — M2550 Pain in unspecified joint: Secondary | ICD-10-CM | POA: Diagnosis not present

## 2013-05-25 DIAGNOSIS — M25569 Pain in unspecified knee: Secondary | ICD-10-CM | POA: Diagnosis not present

## 2013-05-25 DIAGNOSIS — M7020 Olecranon bursitis, unspecified elbow: Secondary | ICD-10-CM | POA: Diagnosis not present

## 2013-06-30 ENCOUNTER — Encounter (HOSPITAL_COMMUNITY): Payer: Self-pay | Admitting: Emergency Medicine

## 2013-06-30 ENCOUNTER — Emergency Department (HOSPITAL_COMMUNITY)
Admission: EM | Admit: 2013-06-30 | Discharge: 2013-06-30 | Disposition: A | Discharge status: Home / Self Care | Payer: Medicare Other | Attending: Emergency Medicine | Admitting: Emergency Medicine

## 2013-06-30 DIAGNOSIS — Z8719 Personal history of other diseases of the digestive system: Secondary | ICD-10-CM | POA: Diagnosis not present

## 2013-06-30 DIAGNOSIS — F17200 Nicotine dependence, unspecified, uncomplicated: Secondary | ICD-10-CM | POA: Diagnosis not present

## 2013-06-30 DIAGNOSIS — Z7982 Long term (current) use of aspirin: Secondary | ICD-10-CM | POA: Insufficient documentation

## 2013-06-30 DIAGNOSIS — M109 Gout, unspecified: Secondary | ICD-10-CM | POA: Insufficient documentation

## 2013-06-30 DIAGNOSIS — R000 Tachycardia, unspecified: Secondary | ICD-10-CM | POA: Diagnosis not present

## 2013-06-30 DIAGNOSIS — M1990 Unspecified osteoarthritis, unspecified site: Secondary | ICD-10-CM | POA: Diagnosis not present

## 2013-06-30 DIAGNOSIS — M545 Low back pain, unspecified: Secondary | ICD-10-CM | POA: Diagnosis not present

## 2013-06-30 DIAGNOSIS — G8929 Other chronic pain: Secondary | ICD-10-CM | POA: Insufficient documentation

## 2013-06-30 DIAGNOSIS — Z862 Personal history of diseases of the blood and blood-forming organs and certain disorders involving the immune mechanism: Secondary | ICD-10-CM | POA: Diagnosis not present

## 2013-06-30 DIAGNOSIS — I5020 Unspecified systolic (congestive) heart failure: Secondary | ICD-10-CM | POA: Diagnosis not present

## 2013-06-30 DIAGNOSIS — E119 Type 2 diabetes mellitus without complications: Secondary | ICD-10-CM | POA: Diagnosis not present

## 2013-06-30 DIAGNOSIS — Z9889 Other specified postprocedural states: Secondary | ICD-10-CM | POA: Insufficient documentation

## 2013-06-30 MED ORDER — OXYCODONE-ACETAMINOPHEN 5-325 MG PO TABS: 1.0000 | ORAL_TABLET | Freq: Four times a day (QID) | ORAL | Status: DC | PRN

## 2013-06-30 MED ORDER — METHOCARBAMOL 500 MG PO TABS: 1000.0000 mg | ORAL_TABLET | Freq: Four times a day (QID) | ORAL | Status: DC

## 2013-06-30 MED ORDER — OXYCODONE-ACETAMINOPHEN 5-325 MG PO TABS
1.0000 | ORAL_TABLET | Freq: Once | ORAL | Status: AC
Start: 2013-06-30 — End: 2013-06-30
  Administered 2013-06-30: 1 via ORAL
  Filled 2013-06-30: qty 1

## 2013-06-30 MED ORDER — METHOCARBAMOL 500 MG PO TABS
500.0000 mg | ORAL_TABLET | Freq: Once | ORAL | Status: AC
  Administered 2013-06-30: 500 mg via ORAL
  Filled 2013-06-30: qty 1

## 2013-06-30 NOTE — ED Notes (Signed)
Will reassess vitals after pain medication effects.

## 2013-06-30 NOTE — Discharge Instructions (Signed)
Please read and follow all provided instructions.  Your diagnoses today include:  1. Low back pain    Tests performed today include:  Vital signs - see below for your results today  Medications prescribed:   Percocet (oxycodone/acetaminophen) - narcotic pain medication  DO NOT drive or perform any activities that require you to be awake and alert because this medicine can make you drowsy. BE VERY CAREFUL not to take multiple medicines containing Tylenol (also called acetaminophen). Doing so can lead to an overdose which can damage your liver and cause liver failure and possibly death.   Robaxin (methocarbamol) - muscle relaxer medication  DO NOT drive or perform any activities that require you to be awake and alert because this medicine can make you drowsy.   Take any prescribed medications only as directed.  Home care instructions:   Follow any educational materials contained in this packet  Please rest, use ice or heat on your back for the next several days  Do not lift, push, pull anything more than 10 pounds for the next week  Follow-up instructions: Please follow-up with your primary care provider in the next 1 week for further evaluation of your symptoms. If you do not have a primary care doctor -- see below for referral information.   Return instructions:  SEEK IMMEDIATE MEDICAL ATTENTION IF YOU HAVE:  New numbness, tingling, weakness, or problem with the use of your arms or legs  Severe back pain not relieved with medications  Loss control of your bowels or bladder  Increasing pain in any areas of the body (such as chest or abdominal pain)  Shortness of breath, dizziness, or fainting.   Worsening nausea (feeling sick to your stomach), vomiting, fever, or sweats  Any other emergent concerns regarding your health   Additional Information:  Your vital signs today were: BP 147/93   Pulse 115   Temp(Src) 98.6 F (37 C) (Oral)   Resp 16   SpO2 97% If your  blood pressure (BP) was elevated above 135/85 this visit, please have this repeated by your doctor within one month. --------------

## 2013-06-30 NOTE — ED Provider Notes (Signed)
CSN: [id number]     Arrival date & time 06/30/13  1302 History  This chart was scribed for Travis Lemming, PA working with Travis Frames, MD by [name], ED Scribe. This patient was seen in room [facility] and the patient's care was started at 2:08 PM.   Chief Complaint  Patient presents with  . Back Pain    The history is provided by the patient. No language interpreter was used.    HPI Comments: Travis [name] is a 58 y.o. male who presents to the Emergency Department complaining of 2 days progressively-worsening exacerbation of his chronic lower back pain.  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  He denies any injuries or unusual activities that may have brought on his current episode.  Pain does not radiate into legs.  He is able to walk.  He denies loss of bowel or bladder control, numbness or tingling in groin, fever, or unexpected weight loss.  He has attempted to treat pain with Advil, with some relief.  Pt states he has not used alcohol since Christmas.   Past Medical History  Diagnosis Date  . Systolic heart failure     EF 20-25% by echo 12/28/10  . Nonischemic cardiomyopathy     minimal coronary disease by cath 12/29/10  . Hemoptysis     secondary to pulmonary edema  . Hyperlipidemia   . Chronic anemia   . GI bleed     15 years ago  . Tobacco abuse   . Alcohol abuse   . Osteoarthritis   . Gout   . Diabetes mellitus, type 2     pt reports his DM is gone  . Alcoholic cardiomyopathy [id number]    Past Surgical History  Procedure Laterality Date  . Cardiac catheterization    . Hip arthroplasty Right 03/15/2013    Procedure: ARTHROPLASTY BIPOLAR HIP;  Surgeon: [name], MD;  Location: [facility];  Service: Orthopedics;  Laterality: Right;    Family History  Problem Relation Age of Onset  . Diabetes Mother   . Hypertension Mother   . Diabetes Father   . Hypertension Father     History  Substance Use Topics  . Smoking status: Current  Every Day Smoker -- 4.00 packs/day    Types: Cigarettes  . Smokeless tobacco: Not on file  . Alcohol Use: Yes     Comment: occasionally     Review of Systems  Constitutional: Negative for fever and unexpected weight change.  Gastrointestinal: Negative for diarrhea and constipation.       Neg for fecal incontinence  Genitourinary: Negative for hematuria, flank pain, enuresis and difficulty urinating.       Negative for urinary incontinence or retention  Musculoskeletal: Positive for back pain.  Neurological: Negative for weakness and numbness.       Negative for saddle paresthesias     Allergies  Review of patient's allergies indicates no known allergies.  Home Medications   Current Outpatient Rx  Name  Route  Sig  Dispense  Refill  . aspirin 325 MG EC tablet   Oral   Take 325 mg by mouth daily.         . colchicine 0.6 MG tablet   Oral   Take 0.6 mg by mouth 2 (two) times daily as needed.         [name] lisinopril (PRINIVIL,ZESTRIL) 5 MG tablet   Oral   Take 5 mg by mouth daily as needed.         [name]  Multiple Vitamin (ONE-A-DAY MENS PO)   Oral   Take 1 tablet by mouth.         . oxyCODONE (OXY IR/ROXICODONE) 5 MG immediate release tablet   Oral   Take 5 mg by mouth every 4 (four) hours as needed for severe pain.           BP 147/93  Pulse 115  Temp(Src) 98.6 F (37 C) (Oral)  Resp 16  SpO2 97%  Physical Exam  Nursing note and vitals reviewed. Constitutional: He appears well-developed and well-nourished. He appears distressed.  Uncomfortable appearing  HENT:  Head: Normocephalic and atraumatic.  Eyes: Conjunctivae are normal.  Neck: Normal range of motion.  Cardiovascular:  Pulses:      Dorsalis pedis pulses are 2+ on the right side, and 2+ on the left side.       Posterior tibial pulses are 2+ on the right side, and 2+ on the left side.  Mild tachycardia.  Abdominal: Soft. He exhibits no pulsatile midline mass. There is no tenderness. There is no  CVA tenderness.  Musculoskeletal:       Cervical back: He exhibits normal range of motion, no tenderness and no bony tenderness.       Thoracic back: He exhibits normal range of motion, no tenderness and no bony tenderness.       Lumbar back: He exhibits tenderness. He exhibits normal range of motion and no bony tenderness.  No step-off noted with palpation of spine.   Neurological: He is alert. He has normal reflexes. No sensory deficit. He exhibits normal muscle tone.  5/5 strength in entire lower extremities bilaterally. No sensation deficit.   Skin: Skin is warm and dry.  Psychiatric: He has a normal mood and affect.    ED Course  Procedures (including critical care time)  DIAGNOSTIC STUDIES: Oxygen Saturation is 97% on room air, normal by my interpretation.    COORDINATION OF CARE: 2:12 PM-Discussed treatment plan which includes pain medication, anti-inflammatories, muscle relaxants, and orthopedic f/u with pt at bedside and pt agreed to plan.     Labs Review Labs Reviewed - No data to display  Imaging Review No results found.   EKG Interpretation None      2:40 PM Patient seen and examined. Work-up initiated. Medications ordered.   Vital signs reviewed and are as follows: Filed Vitals:   06/30/13 1436  BP: 161/101  Pulse: 110  Temp:   Resp:     No red flag s/s of low back pain. Patient was counseled on back pain precautions and told to do activity as tolerated but do not lift, push, or pull heavy objects more than 10 pounds for the next week.  Patient counseled to use ice or heat on back for no longer than 15 minutes every hour.   Patient prescribed muscle relaxer and counseled on proper use of muscle relaxant medication.    Patient prescribed narcotic pain medicine and counseled on proper use of narcotic pain medications. Counseled not to combine this medication with others containing tylenol.   Urged patient not to drink alcohol, drive, or perform any other  activities that requires focus while taking either of these medications.  Patient urged to follow-up with PCP if pain does not improve with treatment and rest or if pain becomes recurrent. Urged to return with worsening severe pain, loss of bowel or bladder control, trouble walking.   The patient verbalizes understanding and agrees with the plan.   MDM  Final diagnoses:  Low back pain   Patient with back pain, history of same. Mild tachycardia, suspect secondary to pain. Patient has no chest or abdominal pain today. No neurological deficits. Patient is ambulatory. No warning symptoms of back pain including: loss of bowel or bladder control, night sweats, waking from sleep with back pain, unexplained fevers or weight loss, h/o cancer, IVDU, recent trauma. No concern for cauda equina, epidural abscess, or other serious cause of back pain. Conservative measures such as rest, ice/heat and pain medicine indicated with PCP follow-up if no improvement with conservative management.   Mild tachycardia here. No bleeding today. Review of past records over past year shows typical HR approx 100. Do not suspect EtOH withdrawal.    I personally performed the services described in this documentation, which was scribed in my presence. The recorded information has been reviewed and is accurate.    Carlisle Cater, PA-C 06/30/13 1443

## 2013-06-30 NOTE — ED Notes (Signed)
Pt reports hx of "back going out" in past. Reports back pain x2 days. 9/10. Ambulatory.

## 2013-06-30 NOTE — ED Provider Notes (Signed)
Medical screening examination/treatment/procedure(s) were performed by non-physician practitioner and as supervising physician I was immediately available for consultation/collaboration.   [name], MD 06/30/13 [phone]

## 2013-07-02 DIAGNOSIS — M25569 Pain in unspecified knee: Secondary | ICD-10-CM | POA: Diagnosis not present

## 2013-07-02 DIAGNOSIS — M7020 Olecranon bursitis, unspecified elbow: Secondary | ICD-10-CM | POA: Diagnosis not present

## 2013-07-06 ENCOUNTER — Ambulatory Visit (INDEPENDENT_AMBULATORY_CARE_PROVIDER_SITE_OTHER): Payer: Medicare Other | Admitting: Internal Medicine

## 2013-07-06 ENCOUNTER — Telehealth: Payer: Self-pay | Admitting: Internal Medicine

## 2013-07-06 ENCOUNTER — Encounter: Payer: Self-pay | Admitting: Internal Medicine

## 2013-07-06 VITALS — BP 132/84 | HR 97 | Temp 98.2°F | Ht 73.5 in | Wt 178.0 lb

## 2013-07-06 DIAGNOSIS — E785 Hyperlipidemia, unspecified: Secondary | ICD-10-CM | POA: Diagnosis not present

## 2013-07-06 DIAGNOSIS — I509 Heart failure, unspecified: Secondary | ICD-10-CM | POA: Diagnosis not present

## 2013-07-06 DIAGNOSIS — E119 Type 2 diabetes mellitus without complications: Secondary | ICD-10-CM

## 2013-07-06 DIAGNOSIS — F17200 Nicotine dependence, unspecified, uncomplicated: Secondary | ICD-10-CM

## 2013-07-06 DIAGNOSIS — I428 Other cardiomyopathies: Secondary | ICD-10-CM

## 2013-07-06 DIAGNOSIS — M109 Gout, unspecified: Secondary | ICD-10-CM

## 2013-07-06 LAB — LIPID PANEL
CHOL/HDL RATIO: 3
Cholesterol: 180 mg/dL (ref 0–200)
HDL: 52.4 mg/dL (ref >39.00)
LDL Cholesterol: 65 mg/dL (ref 0–99)
Triglycerides: 315 mg/dL — ABNORMAL HIGH (ref 0.0–149.0)
VLDL: 63 mg/dL — AB (ref 0.0–40.0)

## 2013-07-06 LAB — CBC
HEMATOCRIT: 42.2 % (ref 39.0–52.0)
HEMOGLOBIN: 13.9 g/dL (ref 13.0–17.0)
MCHC: 32.9 g/dL (ref 30.0–36.0)
MCV: 88.9 fl (ref 78.0–100.0)
Platelets: 453 10*3/uL — ABNORMAL HIGH (ref 150.0–400.0)
RBC: 4.74 Mil/uL (ref 4.22–5.81)
RDW: 18 % — AB (ref 11.5–14.6)
WBC: 6.7 10*3/uL (ref 4.5–10.5)

## 2013-07-06 LAB — COMPREHENSIVE METABOLIC PANEL
ALT: 33 U/L (ref 0–53)
AST: 31 U/L (ref 0–37)
Albumin: 3.7 g/dL (ref 3.5–5.2)
Alkaline Phosphatase: 94 U/L (ref 39–117)
BILIRUBIN TOTAL: 0.2 mg/dL — AB (ref 0.3–1.2)
BUN: 15 mg/dL (ref 6–23)
CO2: 28 mEq/L (ref 19–32)
Calcium: 9.6 mg/dL (ref 8.4–10.5)
Chloride: 100 mEq/L (ref 96–112)
Creatinine, Ser: 1 mg/dL (ref 0.4–1.5)
GFR: 98.88 mL/min (ref >60.00)
Glucose, Bld: 86 mg/dL (ref 70–99)
Potassium: 4.9 mEq/L (ref 3.5–5.1)
SODIUM: 137 meq/L (ref 135–145)
TOTAL PROTEIN: 7.7 g/dL (ref 6.0–8.3)

## 2013-07-06 LAB — HEMOGLOBIN A1C: HEMOGLOBIN A1C: 6.5 % (ref 4.6–6.5)

## 2013-07-06 NOTE — Assessment & Plan Note (Signed)
Encouraged him to quit, he does not seem interested

## 2013-07-06 NOTE — Progress Notes (Signed)
Pre visit review using our clinic review tool, if applicable. No additional management support is needed unless otherwise documented below in the visit note. 

## 2013-07-06 NOTE — Assessment & Plan Note (Signed)
Will recheck lipid profile

## 2013-07-06 NOTE — Assessment & Plan Note (Signed)
No longer taking diuretic Will check CBC, CMET today

## 2013-07-06 NOTE — Patient Instructions (Addendum)

## 2013-07-06 NOTE — Assessment & Plan Note (Signed)
Colchicine refilled today. 

## 2013-07-06 NOTE — Assessment & Plan Note (Signed)
No longer taking his lisinopril BP fine today Will check labs

## 2013-07-06 NOTE — Telephone Encounter (Signed)
Relevant patient education assigned to patient using Emmi. ° °

## 2013-07-06 NOTE — Progress Notes (Signed)
HPI  Pt presents to the clinic today to establish care. He has not had a PCP in many years. He does have some concerns today about toenail fungus. He would like to know what he should do about it.  Flu: 02/2013 Pneumovax: 02/2013 PSA screen: never Colon screening: never Eye Doctor: never Dentist: as needed (no teeth)  Past Medical History  Diagnosis Date  . Systolic heart failure     EF 20-25% by echo 12/28/10  . Nonischemic cardiomyopathy     minimal coronary disease by cath 12/29/10  . Hemoptysis     secondary to pulmonary edema  . Hyperlipidemia   . Chronic anemia   . GI bleed     15 years ago  . Tobacco abuse   . Alcohol abuse   . Osteoarthritis   . Gout   . Diabetes mellitus, type 2     pt reports his DM is gone  . 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 aspirin 325 MG EC tablet Take 325 mg by mouth daily.      . colchicine 0.6 MG tablet Take 0.6 mg by mouth 2 (two) times daily as needed.      . methocarbamol (ROBAXIN) 500 MG tablet Take 2 tablets (1,000 mg total) by mouth 4 (four) times daily.  20 tablet  0  . Multiple Vitamin (ONE-A-DAY MENS PO) Take 1 tablet by mouth.      . oxyCODONE-acetaminophen (PERCOCET/ROXICET) 5-325 MG per tablet Take 1-2 tablets by mouth every 6 (six) hours as needed for severe pain.  10 tablet  0   No current facility-administered medications for this visit.    No Known Allergies  Family History  Problem Relation Age of Onset  . Diabetes Mother   . Hypertension Mother   . Diabetes Father   . Hypertension Father     History   Social History  . Marital Status: Single    Spouse Name: N/A    Number of Children: N/A  . Years of Education: N/A   Occupational History  . Not on file.   Social History Main Topics  . Smoking status: Current Every Day Smoker -- 0.75 packs/day    Types: Cigarettes  . Smokeless tobacco: Not on file  . Alcohol Use: Yes     Comment:  occasionally  . Drug Use: No  . Sexual Activity: No   Other Topics Concern  . Not on file   Social History Narrative  . No narrative on file    ROS:  Constitutional: Denies fever, malaise, fatigue, headache or abrupt weight changes.  HEENT: Denies eye pain, eye redness, ear pain, ringing in the ears, wax buildup, runny nose, nasal congestion, bloody nose, or sore throat. Respiratory: Denies difficulty breathing, shortness of breath, cough or sputum production.   Cardiovascular: Denies chest pain, chest tightness, palpitations or swelling in the hands or feet.  Gastrointestinal: Denies abdominal pain, bloating, constipation, diarrhea or blood in the stool.  GU: Denies frequency, urgency, pain with urination, blood in urine, odor or discharge. Musculoskeletal: Pt reports right hip pain. Denies decrease in range of motion, difficulty with gait, muscle pain.  Skin: Denies redness, rashes, lesions or ulcercations.  Neurological: Denies dizziness, difficulty with memory, difficulty with speech or problems with balance and coordination.   No other specific complaints in a complete review of systems (except as listed in HPI above).  PE:  BP 132/84  Pulse 97  Temp(Src) 98.2  F (36.8 C) (Oral)  Ht 6' 1.5" (1.867 m)  Wt 178 lb (80.74 kg)  BMI 23.16 kg/m2  SpO2 98% Wt Readings from Last 3 Encounters:  07/06/13 178 lb (80.74 kg)  03/25/13 176 lb 9.4 oz (80.1 kg)  03/25/13 176 lb 9.4 oz (80.1 kg)    General: Appears his stated age, well developed, well nourished in NAD. Skin: Toenail fungus noted bilaterally. HEENT: Head: normal shape and size; Eyes: sclera white, no icterus, conjunctiva pink, PERRLA and EOMs intact; Ears: Tm's gray and intact, normal light reflex; Nose: mucosa pink and moist, septum midline; Throat/Mouth: Teeth present, mucosa pink and moist, no lesions or ulcerations noted.  Neck: Normal range of motion. Neck supple, trachea midline. No massses, lumps or thyromegaly  present.  Cardiovascular: Normal rate and rhythm. S1,S2 noted. S3 noted. No murmur, rubs or gallops noted. No JVD or BLE edema. No carotid bruits noted. Pulmonary/Chest: Normal effort and fine crackles noted bilaterally. No respiratory distress. No wheezes,  or ronchi noted.  Abdomen: Soft and nontender. Normal bowel sounds, no bruits noted. No distention or masses noted. Liver, spleen and kidneys non palpable. Musculoskeletal: Normal range of motion. Bilateral olecranon bursitis noted. No difficulty with gait.  Neurological: Alert and oriented. Cranial nerves II-XII intact. Coordination normal. +DTRs bilaterally. Psychiatric: Mood and affect normal. Behavior is normal. Judgment and thought content normal.   EKG:  BMET    Component Value Date/Time   NA 134* 03/25/2013 0630   NA 145* 02/25/2012 1409   K 4.3 03/25/2013 0630   CL 94* 03/25/2013 0630   CO2 28 03/25/2013 0630   GLUCOSE 121* 03/25/2013 0630   GLUCOSE 87 02/25/2012 1409   BUN 11 03/25/2013 0630   BUN 11 02/25/2012 1409   CREATININE 0.86 03/25/2013 0630   CALCIUM 9.0 03/25/2013 0630   GFRNONAA >90 03/25/2013 0630   GFRAA >90 03/25/2013 0630    Lipid Panel     Component Value Date/Time   CHOL 150 03/20/2013 0825   TRIG 216* 03/20/2013 0825   HDL 38* 03/20/2013 0825   CHOLHDL 3.9 03/20/2013 0825   VLDL 43* 03/20/2013 0825   LDLCALC 69 03/20/2013 0825    CBC    Component Value Date/Time   WBC 5.5 03/21/2013 0500   RBC 2.56* 03/21/2013 0500   RBC 2.93* 12/28/2010 0345   HGB 8.0* 03/21/2013 0500   HCT 23.7* 03/21/2013 0500   PLT 297 03/21/2013 0500   MCV 92.6 03/21/2013 0500   MCH 31.3 03/21/2013 0500   MCHC 33.8 03/21/2013 0500   RDW 19.1* 03/21/2013 0500   LYMPHSABS 2.6 03/15/2013 0430   MONOABS 0.3 03/15/2013 0430   EOSABS 0.2 03/15/2013 0430   BASOSABS 0.0 03/15/2013 0430    Hgb A1C Lab Results  Component Value Date   HGBA1C 6.0* 03/15/2013     Assessment and Plan:  Will set him up with GI for  possible colonscopy

## 2013-07-06 NOTE — Assessment & Plan Note (Signed)
Diet controlled Will check A1C today

## 2013-07-07 ENCOUNTER — Other Ambulatory Visit: Payer: Self-pay

## 2013-07-07 MED ORDER — COLCHICINE 0.6 MG PO TABS: 0.6000 mg | ORAL_TABLET | Freq: Two times a day (BID) | ORAL | Status: DC | PRN

## 2013-07-07 NOTE — Telephone Encounter (Signed)
Pt is requesting you to refill his Percocet--please advise

## 2013-07-07 NOTE — Telephone Encounter (Signed)
No, he told me he had plenty of vicodin left and he would use that til it ran out.

## 2013-07-09 NOTE — Telephone Encounter (Signed)
Was unsuccessful reaching pt--there was not a VM for me to leave msg--will try again later

## 2013-07-15 NOTE — Telephone Encounter (Signed)
Was still unable to reach pt by phone number in chart

## 2013-07-20 ENCOUNTER — Telehealth: Payer: Self-pay

## 2013-07-20 NOTE — Telephone Encounter (Signed)
Relevant patient education mailed to patient.  

## 2013-07-28 ENCOUNTER — Other Ambulatory Visit: Payer: Self-pay

## 2013-07-28 MED ORDER — LISINOPRIL-HYDROCHLOROTHIAZIDE 10-12.5 MG PO TABS: 1.0000 | ORAL_TABLET | Freq: Every day | ORAL | Status: DC

## 2013-07-28 NOTE — Telephone Encounter (Signed)
Rx sent in via request from [name] NP-- pt was notified during phone call of results that Rx would be sent to pharmacy

## 2013-08-06 DIAGNOSIS — S72033A Displaced midcervical fracture of unspecified femur, initial encounter for closed fracture: Secondary | ICD-10-CM | POA: Diagnosis not present

## 2013-09-13 ENCOUNTER — Observation Stay (HOSPITAL_COMMUNITY)
Admission: EM | Admit: 2013-09-13 | Discharge: 2013-09-16 | Disposition: A | Discharge status: Home / Self Care | Payer: Medicare Other | Attending: Internal Medicine | Admitting: Internal Medicine

## 2013-09-13 ENCOUNTER — Encounter (HOSPITAL_COMMUNITY): Payer: Self-pay | Admitting: Emergency Medicine

## 2013-09-13 DIAGNOSIS — I10 Essential (primary) hypertension: Secondary | ICD-10-CM | POA: Insufficient documentation

## 2013-09-13 DIAGNOSIS — Z96649 Presence of unspecified artificial hip joint: Secondary | ICD-10-CM | POA: Insufficient documentation

## 2013-09-13 DIAGNOSIS — M1990 Unspecified osteoarthritis, unspecified site: Secondary | ICD-10-CM | POA: Insufficient documentation

## 2013-09-13 DIAGNOSIS — F17200 Nicotine dependence, unspecified, uncomplicated: Secondary | ICD-10-CM | POA: Diagnosis present

## 2013-09-13 DIAGNOSIS — M109 Gout, unspecified: Secondary | ICD-10-CM | POA: Insufficient documentation

## 2013-09-13 DIAGNOSIS — H3412 Central retinal artery occlusion, left eye: Secondary | ICD-10-CM

## 2013-09-13 DIAGNOSIS — N289 Disorder of kidney and ureter, unspecified: Secondary | ICD-10-CM | POA: Diagnosis present

## 2013-09-13 DIAGNOSIS — H5460 Unqualified visual loss, one eye, unspecified: Secondary | ICD-10-CM | POA: Diagnosis not present

## 2013-09-13 DIAGNOSIS — E785 Hyperlipidemia, unspecified: Secondary | ICD-10-CM | POA: Diagnosis present

## 2013-09-13 DIAGNOSIS — I426 Alcoholic cardiomyopathy: Secondary | ICD-10-CM | POA: Insufficient documentation

## 2013-09-13 DIAGNOSIS — I428 Other cardiomyopathies: Secondary | ICD-10-CM | POA: Diagnosis not present

## 2013-09-13 DIAGNOSIS — H3410 Central retinal artery occlusion, unspecified eye: Secondary | ICD-10-CM | POA: Diagnosis not present

## 2013-09-13 DIAGNOSIS — I5022 Chronic systolic (congestive) heart failure: Secondary | ICD-10-CM | POA: Diagnosis not present

## 2013-09-13 DIAGNOSIS — Z7982 Long term (current) use of aspirin: Secondary | ICD-10-CM | POA: Diagnosis not present

## 2013-09-13 DIAGNOSIS — I2510 Atherosclerotic heart disease of native coronary artery without angina pectoris: Secondary | ICD-10-CM | POA: Diagnosis not present

## 2013-09-13 DIAGNOSIS — I509 Heart failure, unspecified: Secondary | ICD-10-CM | POA: Diagnosis not present

## 2013-09-13 DIAGNOSIS — F1021 Alcohol dependence, in remission: Secondary | ICD-10-CM | POA: Diagnosis present

## 2013-09-13 DIAGNOSIS — D509 Iron deficiency anemia, unspecified: Secondary | ICD-10-CM

## 2013-09-13 DIAGNOSIS — H5462 Unqualified visual loss, left eye, normal vision right eye: Secondary | ICD-10-CM | POA: Diagnosis present

## 2013-09-13 DIAGNOSIS — F1010 Alcohol abuse, uncomplicated: Secondary | ICD-10-CM | POA: Diagnosis not present

## 2013-09-13 DIAGNOSIS — E119 Type 2 diabetes mellitus without complications: Secondary | ICD-10-CM | POA: Diagnosis present

## 2013-09-13 HISTORY — DX: Central retinal artery occlusion, left eye: H34.12

## 2013-09-13 LAB — COMPREHENSIVE METABOLIC PANEL
ALT: 25 U/L (ref 0–53)
AST: 29 U/L (ref 0–37)
Albumin: 3.5 g/dL (ref 3.5–5.2)
Alkaline Phosphatase: 91 U/L (ref 39–117)
BUN: 16 mg/dL (ref 6–23)
CALCIUM: 9.6 mg/dL (ref 8.4–10.5)
CO2: 26 meq/L (ref 19–32)
Chloride: 98 mEq/L (ref 96–112)
Creatinine, Ser: 1.4 mg/dL — ABNORMAL HIGH (ref 0.50–1.35)
GFR, EST AFRICAN AMERICAN: 63 mL/min — AB (ref >90)
GFR, EST NON AFRICAN AMERICAN: 54 mL/min — AB (ref >90)
GLUCOSE: 110 mg/dL — AB (ref 70–99)
Potassium: 3.9 mEq/L (ref 3.7–5.3)
Sodium: 138 mEq/L (ref 137–147)
Total Bilirubin: 0.5 mg/dL (ref 0.3–1.2)
Total Protein: 6.8 g/dL (ref 6.0–8.3)

## 2013-09-13 LAB — SEDIMENTATION RATE: Sed Rate: 28 mm/hr — ABNORMAL HIGH (ref 0–16)

## 2013-09-13 LAB — ANTITHROMBIN III: AntiThromb III Func: 107 % (ref 75–120)

## 2013-09-13 LAB — I-STAT CHEM 8, ED
BUN: 16 mg/dL (ref 6–23)
CALCIUM ION: 1.28 mmol/L — AB (ref 1.12–1.23)
Chloride: 100 mEq/L (ref 96–112)
Creatinine, Ser: 1.5 mg/dL — ABNORMAL HIGH (ref 0.50–1.35)
Glucose, Bld: 109 mg/dL — ABNORMAL HIGH (ref 70–99)
HEMATOCRIT: 37 % — AB (ref 39.0–52.0)
HEMOGLOBIN: 12.6 g/dL — AB (ref 13.0–17.0)
Potassium: 3.7 mEq/L (ref 3.7–5.3)
Sodium: 138 mEq/L (ref 137–147)
TCO2: 27 mmol/L (ref 0–100)

## 2013-09-13 LAB — CBC
HEMATOCRIT: 33.8 % — AB (ref 39.0–52.0)
Hemoglobin: 11.5 g/dL — ABNORMAL LOW (ref 13.0–17.0)
MCH: 31.7 pg (ref 26.0–34.0)
MCHC: 34 g/dL (ref 30.0–36.0)
MCV: 93.1 fL (ref 78.0–100.0)
Platelets: 227 10*3/uL (ref 150–400)
RBC: 3.63 MIL/uL — ABNORMAL LOW (ref 4.22–5.81)
RDW: 17.1 % — ABNORMAL HIGH (ref 11.5–15.5)
WBC: 6 10*3/uL (ref 4.0–10.5)

## 2013-09-13 LAB — DIFFERENTIAL
BASOS ABS: 0 10*3/uL (ref 0.0–0.1)
Basophils Relative: 0 % (ref 0–1)
EOS PCT: 5 % (ref 0–5)
Eosinophils Absolute: 0.3 10*3/uL (ref 0.0–0.7)
LYMPHS ABS: 1.9 10*3/uL (ref 0.7–4.0)
LYMPHS PCT: 31 % (ref 12–46)
Monocytes Absolute: 0.3 10*3/uL (ref 0.1–1.0)
Monocytes Relative: 5 % (ref 3–12)
Neutro Abs: 3.6 10*3/uL (ref 1.7–7.7)
Neutrophils Relative %: 59 % (ref 43–77)

## 2013-09-13 LAB — GLUCOSE, CAPILLARY: GLUCOSE-CAPILLARY: 182 mg/dL — AB (ref 70–99)

## 2013-09-13 LAB — PROTIME-INR
INR: 0.96 (ref 0.00–1.49)
Prothrombin Time: 12.6 seconds (ref 11.6–15.2)

## 2013-09-13 LAB — URINALYSIS, ROUTINE W REFLEX MICROSCOPIC
BILIRUBIN URINE: NEGATIVE
GLUCOSE, UA: NEGATIVE mg/dL
Hgb urine dipstick: NEGATIVE
KETONES UR: NEGATIVE mg/dL
Nitrite: NEGATIVE
PH: 5 (ref 5.0–8.0)
Protein, ur: NEGATIVE mg/dL
Specific Gravity, Urine: 1.024 (ref 1.005–1.030)
Urobilinogen, UA: 1 mg/dL (ref 0.0–1.0)

## 2013-09-13 LAB — RAPID URINE DRUG SCREEN, HOSP PERFORMED
Amphetamines: NOT DETECTED
BENZODIAZEPINES: NOT DETECTED
Barbiturates: NOT DETECTED
COCAINE: NOT DETECTED
Opiates: POSITIVE — AB
Tetrahydrocannabinol: POSITIVE — AB

## 2013-09-13 LAB — URINE MICROSCOPIC-ADD ON

## 2013-09-13 LAB — I-STAT TROPONIN, ED: TROPONIN I, POC: 0.01 ng/mL (ref 0.00–0.08)

## 2013-09-13 MED ORDER — ASPIRIN 325 MG PO TABS
325.0000 mg | ORAL_TABLET | Freq: Every day | ORAL | Status: DC
  Administered 2013-09-14 – 2013-09-16 (×3): 325 mg via ORAL
  Filled 2013-09-13 (×4): qty 1

## 2013-09-13 MED ORDER — LORAZEPAM 1 MG PO TABS: 1.0000 mg | ORAL_TABLET | Freq: Four times a day (QID) | ORAL | Status: DC | PRN

## 2013-09-13 MED ORDER — ENOXAPARIN SODIUM 30 MG/0.3ML ~~LOC~~ SOLN
30.0000 mg | SUBCUTANEOUS | Status: DC
  Administered 2013-09-13 – 2013-09-14 (×2): 30 mg via SUBCUTANEOUS
  Filled 2013-09-13 (×3): qty 0.3

## 2013-09-13 MED ORDER — NICOTINE 21 MG/24HR TD PT24
21.0000 mg | MEDICATED_PATCH | TRANSDERMAL | Status: DC
  Administered 2013-09-13 – 2013-09-15 (×3): 21 mg via TRANSDERMAL
  Filled 2013-09-13 (×4): qty 1

## 2013-09-13 MED ORDER — ASPIRIN EC 325 MG PO TBEC: 325.0000 mg | DELAYED_RELEASE_TABLET | Freq: Every day | ORAL | Status: DC

## 2013-09-13 MED ORDER — ONE-A-DAY MENS PO TABS: 1.0000 | ORAL_TABLET | Freq: Every day | ORAL | Status: DC

## 2013-09-13 MED ORDER — FOLIC ACID 1 MG PO TABS
1.0000 mg | ORAL_TABLET | Freq: Every day | ORAL | Status: DC
  Administered 2013-09-13 – 2013-09-16 (×4): 1 mg via ORAL
  Filled 2013-09-13 (×5): qty 1

## 2013-09-13 MED ORDER — ADULT MULTIVITAMIN W/MINERALS CH
1.0000 | ORAL_TABLET | Freq: Every day | ORAL | Status: DC
  Administered 2013-09-13 – 2013-09-16 (×4): 1 via ORAL
  Filled 2013-09-13 (×5): qty 1

## 2013-09-13 MED ORDER — SODIUM CHLORIDE 0.9 % IV SOLN
INTRAVENOUS | Status: DC
  Administered 2013-09-13: 21:00:00 via INTRAVENOUS

## 2013-09-13 MED ORDER — VITAMIN B-1 100 MG PO TABS
100.0000 mg | ORAL_TABLET | Freq: Every day | ORAL | Status: DC
  Administered 2013-09-13 – 2013-09-16 (×4): 100 mg via ORAL
  Filled 2013-09-13 (×5): qty 1

## 2013-09-13 MED ORDER — LORAZEPAM 2 MG/ML IJ SOLN: 1.0000 mg | Freq: Four times a day (QID) | INTRAMUSCULAR | Status: DC | PRN

## 2013-09-13 MED ORDER — THIAMINE HCL 100 MG/ML IJ SOLN
100.0000 mg | Freq: Every day | INTRAMUSCULAR | Status: DC
  Filled 2013-09-13 (×2): qty 1

## 2013-09-13 MED ORDER — ACETAMINOPHEN 325 MG PO TABS: 650.0000 mg | ORAL_TABLET | ORAL | Status: DC | PRN

## 2013-09-13 MED ORDER — TETRACAINE HCL 0.5 % OP SOLN
1.0000 [drp] | Freq: Once | OPHTHALMIC | Status: AC
  Administered 2013-09-13: 1 [drp] via OPHTHALMIC
  Filled 2013-09-13: qty 2

## 2013-09-13 NOTE — Consult Note (Signed)
Reason for Consult: Progressive VA loss OS. Referring Physician: ER  Kayo Zion is an 58 y.o. male.  HPI: 58 Y/O BM c acute VA loss os :    Past Medical History  Diagnosis Date  . Systolic heart failure     EF 20-25% by echo 12/28/10  . Nonischemic cardiomyopathy     minimal coronary disease by cath 12/29/10  . Hemoptysis     secondary to pulmonary edema  . Hyperlipidemia   . Chronic anemia   . GI bleed     15 years ago  . Tobacco abuse   . Alcohol abuse   . Osteoarthritis   . Gout   . Diabetes mellitus, type 2     pt reports his DM is gone  . Alcoholic cardiomyopathy 73/71/0626    Past Surgical History  Procedure Laterality Date  . Cardiac catheterization    . Hip arthroplasty Right 03/15/2013    Procedure: ARTHROPLASTY BIPOLAR HIP;  Surgeon: [name], MD;  Location: [facility];  Service: Orthopedics;  Laterality: Right;    Family History  Problem Relation Age of Onset  . Diabetes Mother   . Hypertension Mother   . Diabetes Father   . Hypertension Father   . Cancer Neg Hx   . Heart disease Neg Hx   . Stroke Neg Hx     Social History:  reports that he has been smoking Cigarettes.  He has a 22.5 pack-year smoking history. He does not have any smokeless tobacco history on file. He reports that he drinks about .6 ounces of alcohol per week. He reports that he does not use illicit drugs.  Allergies: No Known Allergies  Medications: I have reviewed the patient's current medications.  Results for orders placed during the hospital encounter of 09/13/13 (from the past 48 hour(s))  PROTIME-INR     Status: None   Collection Time    09/13/13  3:48 PM      Result Value Ref Range   Prothrombin Time 12.6  11.6 - 15.2 seconds   INR 0.96  0.00 - 1.49  CBC     Status: Abnormal   Collection Time    09/13/13  3:48 PM      Result Value Ref Range   WBC 6.0  4.0 - 10.5 K/uL   RBC 3.63 (*) 4.22 - 5.81 MIL/uL   Hemoglobin 11.5 (*) 13.0 - 17.0 g/dL   HCT 33.8 (*) 39.0  - 52.0 %   MCV 93.1  78.0 - 100.0 fL   MCH 31.7  26.0 - 34.0 pg   MCHC 34.0  30.0 - 36.0 g/dL   RDW 17.1 (*) 11.5 - 15.5 %   Platelets 227  150 - 400 K/uL  DIFFERENTIAL     Status: None   Collection Time    09/13/13  3:48 PM      Result Value Ref Range   Neutrophils Relative % 59  43 - 77 %   Neutro Abs 3.6  1.7 - 7.7 K/uL   Lymphocytes Relative 31  12 - 46 %   Lymphs Abs 1.9  0.7 - 4.0 K/uL   Monocytes Relative 5  3 - 12 %   Monocytes Absolute 0.3  0.1 - 1.0 K/uL   Eosinophils Relative 5  0 - 5 %   Eosinophils Absolute 0.3  0.0 - 0.7 K/uL   Basophils Relative 0  0 - 1 %   Basophils Absolute 0.0  0.0 - 0.1 K/uL  COMPREHENSIVE  METABOLIC PANEL     Status: Abnormal   Collection Time    09/13/13  3:48 PM      Result Value Ref Range   Sodium 138  137 - 147 mEq/L   Potassium 3.9  3.7 - 5.3 mEq/L   Chloride 98  96 - 112 mEq/L   CO2 26  19 - 32 mEq/L   Glucose, Bld 110 (*) 70 - 99 mg/dL   BUN 16  6 - 23 mg/dL   Creatinine, Ser 1.40 (*) 0.50 - 1.35 mg/dL   Calcium 9.6  8.4 - 10.5 mg/dL   Total Protein 6.8  6.0 - 8.3 g/dL   Albumin 3.5  3.5 - 5.2 g/dL   AST 29  0 - 37 U/L   ALT 25  0 - 53 U/L   Alkaline Phosphatase 91  39 - 117 U/L   Total Bilirubin 0.5  0.3 - 1.2 mg/dL   GFR calc non Af Amer 54 (*) >90 mL/min   GFR calc Af Amer 63 (*) >90 mL/min   Comment: (NOTE)     The eGFR has been calculated using the CKD EPI equation.     This calculation has not been validated in all clinical situations.     eGFR's persistently <90 mL/min signify possible Chronic Kidney     Disease.  Randolm Idol, ED     Status: None   Collection Time    09/13/13  4:05 PM      Result Value Ref Range   Troponin i, poc 0.01  0.00 - 0.08 ng/mL   Comment 3            Comment: Due to the release kinetics of cTnI,     a negative result within the first hours     of the onset of symptoms does not rule out     myocardial infarction with certainty.     If myocardial infarction is still suspected,      repeat the test at appropriate intervals.  I-STAT CHEM 8, ED     Status: Abnormal   Collection Time    09/13/13  4:07 PM      Result Value Ref Range   Sodium 138  137 - 147 mEq/L   Potassium 3.7  3.7 - 5.3 mEq/L   Chloride 100  96 - 112 mEq/L   BUN 16  6 - 23 mg/dL   Creatinine, Ser 1.50 (*) 0.50 - 1.35 mg/dL   Glucose, Bld 109 (*) 70 - 99 mg/dL   Calcium, Ion 1.28 (*) 1.12 - 1.23 mmol/L   TCO2 27  0 - 100 mmol/L   Hemoglobin 12.6 (*) 13.0 - 17.0 g/dL   HCT 37.0 (*) 39.0 - 52.0 %    No results found.  Review of Systems  Constitutional: Negative.   HENT: Negative.   Eyes: Positive for blurred vision.        VA s Rx : OD : 20/60 :  OS: 20/800  Cardiovascular: Negative.        Congestive systolic heart failure   Blood pressure 113/72, pulse 105, temperature 98.5 F (36.9 C), temperature source Oral, resp. rate 20, SpO2 98.00%. Physical Exam  Constitutional: He appears well-developed.  HENT:  Head: Normocephalic and atraumatic.  Eyes: Conjunctivae and EOM are normal. Left eye exhibits no discharge. No scleral icterus.    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 VA loss os: resisidual extreme temp only os Recc:  CV work/up : Carotid duplex: Echocardiogram: EKG:  Hematology consult: r/o coagulopathy :  Start 1 baby ASA PO Q/day/ F/U evaluation _0  [place].  SPENCER,MICHAEL A 09/13/2013, 5:07 PM

## 2013-09-13 NOTE — Consult Note (Signed)
NEURO HOSPITALIST CONSULT NOTE    Reason for Consult: left visual loss  HPI:                                                                                                                                          Hutton Pellicane is an 58 y.o. male with a past medical history significant for hyperlipidemia, DM type II, non ischemic cardiomyopathy, heavy smoker, alcohol abuse, comes with complains of acute onset left visual loss. He said that yesterday he had some blurriness in both eye, but woke up this morning and noticed declining vision in the left eye that progressed to complete visual loss in few hours. He denies eye pain or HA, jaw claudication, difficulty chewing or swallowing, vertigo, double vision, focal weakness or numbness, unsteadiness, slurred speech, language or vision impairment. No confusion or hearing impairment. Unenhanced CT brain showed no acute intracranial abnormality. Patient already seen by opthalmology.  Past Medical History  Diagnosis Date  . Systolic heart failure     EF 20-25% by echo 12/28/10  . Nonischemic cardiomyopathy     minimal coronary disease by cath 12/29/10  . Hemoptysis     secondary to pulmonary edema  . Hyperlipidemia   . Chronic anemia   . GI bleed     15 years ago  . Tobacco abuse   . Alcohol abuse   . Osteoarthritis   . Gout   . Diabetes mellitus, type 2     pt reports his DM is gone  . Alcoholic cardiomyopathy 50/27/7412    Past Surgical History  Procedure Laterality Date  . Cardiac catheterization    . Hip arthroplasty Right 03/15/2013    Procedure: ARTHROPLASTY BIPOLAR HIP;  Surgeon: [name], MD;  Location: [facility];  Service: Orthopedics;  Laterality: Right;    Family History  Problem Relation Age of Onset  . Diabetes Mother   . Hypertension Mother   . Diabetes Father   . Hypertension Father   . Cancer Neg Hx   . Heart disease Neg Hx   . Stroke Neg Hx     Social History:  reports that he  has been smoking Cigarettes.  He has a 22.5 pack-year smoking history. He does not have any smokeless tobacco history on file. He reports that he drinks about .6 ounces of alcohol per week. He reports that he does not use illicit drugs.  No Known Allergies  MEDICATIONS:  I have reviewed the patient's current medications.   ROS:                                                                                                                                       History obtained from the patient and chart review.  General ROS: negative for - chills, fatigue, fever, night sweats, weight gain or weight loss Psychological ROS: negative for - behavioral disorder, hallucinations, memory difficulties, mood swings or suicidal ideation Ophthalmic ROS: significant for  or loss of vision left eye but negative for blurry vision, double vision, eye pain ENT ROS: negative for - epistaxis, nasal discharge, oral lesions, sore throat, tinnitus or vertigo Allergy and Immunology ROS: negative for - hives or itchy/watery eyes Hematological and Lymphatic ROS: negative for - bleeding problems, bruising or swollen lymph nodes Endocrine ROS: negative for - galactorrhea, hair pattern changes, polydipsia/polyuria or temperature intolerance Respiratory ROS: negative for - cough, hemoptysis, shortness of breath or wheezing Cardiovascular ROS: negative for - chest pain, dyspnea on exertion, edema or irregular heartbeat Gastrointestinal ROS: negative for - abdominal pain, diarrhea, hematemesis, nausea/vomiting or stool incontinence Genito-Urinary ROS: negative for - dysuria, hematuria, incontinence or urinary frequency/urgency Musculoskeletal ROS: negative for - joint swelling or muscular weakness Neurological ROS: as noted in HPI Dermatological ROS: negative for rash and skin lesion changes  Physical exam:  pleasant male in no apparent distress. Blood pressure 124/74, pulse 95, temperature 98.5 F (36.9 C), temperature source Oral, resp. rate 19, SpO2 96.00%. Head: normocephalic. Neck: supple, no bruits, no JVD. Cardiac: no murmurs. Lungs: clear. Abdomen: soft, no tender, no mass. Extremities: no edema. Neurologic Examination:                                                                                                      Mental Status: Alert, oriented, thought content appropriate.  Speech fluent without evidence of aphasia.  Able to follow 3 step commands without difficulty. Cranial Nerves: II: Discs flat bilaterally; pupils have been previously dilated and are 5 mm bilaterally with left APD. Visual acuity:OD : 20/60 : OS: 20/800.   III,IV, VI: ptosis not present, extra-ocular motions intact bilaterally V,VII: smile symmetric, facial light touch sensation normal bilaterally VIII: hearing normal bilaterally IX,X: gag reflex present XI: bilateral shoulder shrug XII: midline tongue extension without atrophy or fasciculations Motor: Right : Upper extremity   5/5    Left:     Upper extremity   5/5  Lower extremity   5/5  Lower extremity   5/5 Tone and bulk:normal tone throughout; no atrophy noted Sensory: Pinprick and light touch intact throughout, bilaterally Deep Tendon Reflexes:  Right: Upper Extremity   Left: Upper extremity   biceps (C-5 to C-6) 2/4   biceps (C-5 to C-6) 2/4 tricep (C7) 2/4    triceps (C7) 2/4 Brachioradialis (C6) 2/4  Brachioradialis (C6) 2/4  Lower Extremity Lower Extremity  quadriceps (L-2 to L-4) 2/4   quadriceps (L-2 to L-4) 2/4 Achilles (S1) 2/4   Achilles (S1) 2/4  Plantars: Right: downgoing   Left: downgoing Cerebellar: normal finger-to-nose,  normal heel-to-shin test. Gait:  No tested CV: pulses palpable throughout    Lab Results  Component Value Date/Time   CHOL 180 07/06/2013  2:19 PM    Results for orders placed during the hospital  encounter of 09/13/13 (from the past 48 hour(s))  PROTIME-INR     Status: None   Collection Time    09/13/13  3:48 PM      Result Value Ref Range   Prothrombin Time 12.6  11.6 - 15.2 seconds   INR 0.96  0.00 - 1.49  CBC     Status: Abnormal   Collection Time    09/13/13  3:48 PM      Result Value Ref Range   WBC 6.0  4.0 - 10.5 K/uL   RBC 3.63 (*) 4.22 - 5.81 MIL/uL   Hemoglobin 11.5 (*) 13.0 - 17.0 g/dL   HCT 33.8 (*) 39.0 - 52.0 %   MCV 93.1  78.0 - 100.0 fL   MCH 31.7  26.0 - 34.0 pg   MCHC 34.0  30.0 - 36.0 g/dL   RDW 17.1 (*) 11.5 - 15.5 %   Platelets 227  150 - 400 K/uL  DIFFERENTIAL     Status: None   Collection Time    09/13/13  3:48 PM      Result Value Ref Range   Neutrophils Relative % 59  43 - 77 %   Neutro Abs 3.6  1.7 - 7.7 K/uL   Lymphocytes Relative 31  12 - 46 %   Lymphs Abs 1.9  0.7 - 4.0 K/uL   Monocytes Relative 5  3 - 12 %   Monocytes Absolute 0.3  0.1 - 1.0 K/uL   Eosinophils Relative 5  0 - 5 %   Eosinophils Absolute 0.3  0.0 - 0.7 K/uL   Basophils Relative 0  0 - 1 %   Basophils Absolute 0.0  0.0 - 0.1 K/uL  COMPREHENSIVE METABOLIC PANEL     Status: Abnormal   Collection Time    09/13/13  3:48 PM      Result Value Ref Range   Sodium 138  137 - 147 mEq/L   Potassium 3.9  3.7 - 5.3 mEq/L   Chloride 98  96 - 112 mEq/L   CO2 26  19 - 32 mEq/L   Glucose, Bld 110 (*) 70 - 99 mg/dL   BUN 16  6 - 23 mg/dL   Creatinine, Ser 1.40 (*) 0.50 - 1.35 mg/dL   Calcium 9.6  8.4 - 10.5 mg/dL   Total Protein 6.8  6.0 - 8.3 g/dL   Albumin 3.5  3.5 - 5.2 g/dL   AST 29  0 - 37 U/L   ALT 25  0 - 53 U/L   Alkaline Phosphatase 91  39 - 117 U/L   Total Bilirubin 0.5  0.3 - 1.2 mg/dL   GFR calc non Af Wyvonnia Lora  54 (*) >90 mL/min   GFR calc Af Amer 63 (*) >90 mL/min   Comment: (NOTE)     The eGFR has been calculated using the CKD EPI equation.     This calculation has not been validated in all clinical situations.     eGFR's persistently <90 mL/min signify possible  Chronic Kidney     Disease.  Randolm Idol, ED     Status: None   Collection Time    09/13/13  4:05 PM      Result Value Ref Range   Troponin i, poc 0.01  0.00 - 0.08 ng/mL   Comment 3            Comment: Due to the release kinetics of cTnI,     a negative result within the first hours     of the onset of symptoms does not rule out     myocardial infarction with certainty.     If myocardial infarction is still suspected,     repeat the test at appropriate intervals.  I-STAT CHEM 8, ED     Status: Abnormal   Collection Time    09/13/13  4:07 PM      Result Value Ref Range   Sodium 138  137 - 147 mEq/L   Potassium 3.7  3.7 - 5.3 mEq/L   Chloride 100  96 - 112 mEq/L   BUN 16  6 - 23 mg/dL   Creatinine, Ser 1.50 (*) 0.50 - 1.35 mg/dL   Glucose, Bld 109 (*) 70 - 99 mg/dL   Calcium, Ion 1.28 (*) 1.12 - 1.23 mmol/L   TCO2 27  0 - 100 mmol/L   Hemoglobin 12.6 (*) 13.0 - 17.0 g/dL   HCT 37.0 (*) 39.0 - 52.0 %  URINE RAPID DRUG SCREEN (HOSP PERFORMED)     Status: Abnormal   Collection Time    09/13/13  4:42 PM      Result Value Ref Range   Opiates POSITIVE (*) NONE DETECTED   Cocaine NONE DETECTED  NONE DETECTED   Benzodiazepines NONE DETECTED  NONE DETECTED   Amphetamines NONE DETECTED  NONE DETECTED   Tetrahydrocannabinol POSITIVE (*) NONE DETECTED   Barbiturates NONE DETECTED  NONE DETECTED   Comment:            DRUG SCREEN FOR MEDICAL PURPOSES     ONLY.  IF CONFIRMATION IS NEEDED     FOR ANY PURPOSE, NOTIFY LAB     WITHIN 5 DAYS.                LOWEST DETECTABLE LIMITS     FOR URINE DRUG SCREEN     Drug Class       Cutoff (ng/mL)     Amphetamine      1000     Barbiturate      200     Benzodiazepine   053     Tricyclics       976     Opiates          300     Cocaine          300     THC              50  URINALYSIS, ROUTINE W REFLEX MICROSCOPIC     Status: Abnormal   Collection Time    09/13/13  4:42 PM      Result Value Ref Range   Color, Urine YELLOW  YELLOW  APPearance CLOUDY (*) CLEAR   Specific Gravity, Urine 1.024  1.005 - 1.030   pH 5.0  5.0 - 8.0   Glucose, UA NEGATIVE  NEGATIVE mg/dL   Hgb urine dipstick NEGATIVE  NEGATIVE   Bilirubin Urine NEGATIVE  NEGATIVE   Ketones, ur NEGATIVE  NEGATIVE mg/dL   Protein, ur NEGATIVE  NEGATIVE mg/dL   Urobilinogen, UA 1.0  0.0 - 1.0 mg/dL   Nitrite NEGATIVE  NEGATIVE   Leukocytes, UA TRACE (*) NEGATIVE  URINE MICROSCOPIC-ADD ON     Status: Abnormal   Collection Time    09/13/13  4:42 PM      Result Value Ref Range   Squamous Epithelial / LPF FEW (*) RARE   WBC, UA 0-2  <3 WBC/hpf   Casts HYALINE CASTS (*) NEGATIVE   Urine-Other MUCOUS PRESENT      No results found.  Assessment/Plan: 58 y.o with several risk factors for stroke and left CRAO. Opthalmology input greatly appreciated and agree with recommendations (CUS, TTE, hypercoagulable profile, MRI brain). Aspirin 325 mg daily.   Dorian Pod, MD 09/13/2013, 6:33 PM [facility]

## 2013-09-13 NOTE — ED Provider Notes (Signed)
CSN: [id number]     Arrival date & time 09/13/13  1346 History   First MD Initiated Contact with Patient 09/13/13 1409     Chief Complaint  Patient presents with  . Eye Problem     (Consider location/radiation/quality/duration/timing/severity/associated sxs/prior Treatment) The history is provided by the patient and medical records. No language interpreter was used.   Travis [name] is a(n) 58 y.o. male who presents to the emergency department with chief complaint of vision loss in the left eye. He has a past medical history of alcoholic cardiomyopathy, alcohol abuse, tobacco abuse, diabetes, hypertension and coronary artery disease. Patient states that last night he began having some spots in the left eye. This morning he awoke with it he describes as "a thumb print" in his visual field. Patient states that he went to church and through out the day it became progressively worse. He used Visine without any relief of his symptoms. He states that he can no longer see anything out of the left eye. He denies any trauma or pain. He has no history of eye problems he does not wear corrective lenses.   Past Medical History  Diagnosis Date  . Systolic heart failure     EF 20-25% by echo 12/28/10  . Nonischemic cardiomyopathy     minimal coronary disease by cath 12/29/10  . Hemoptysis     secondary to pulmonary edema  . Hyperlipidemia   . Chronic anemia   . GI bleed     15 years ago  . Tobacco abuse   . Alcohol abuse   . Osteoarthritis   . Gout   . Diabetes mellitus, type 2     pt reports his DM is gone  . 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 Cardiac catheterization    . Hip arthroplasty Right 03/15/2013    Procedure: ARTHROPLASTY BIPOLAR HIP;  Surgeon: [name], MD;  Location: [facility];  Service: Orthopedics;  Laterality: Right;   Family History  Problem Relation Age of Onset  . Diabetes Mother   . Hypertension Mother   .  Diabetes Father   . Hypertension Father   . Cancer Neg Hx   . Heart disease Neg Hx   . Stroke Neg Hx    History  Substance Use Topics  . Smoking status: Current Every Day Smoker -- 0.75 packs/day for 30 years    Types: Cigarettes  . Smokeless tobacco: Not on file  . Alcohol Use: 0.6 oz/week    1 Glasses of wine per week     Comment: occasionally    Review of Systems  Ten systems reviewed and are negative for acute change, except as noted in the HPI.    Allergies  Review of patient's allergies indicates no known allergies.  Home Medications   Prior to Admission medications   Medication Sig Start Date End Date Taking? Authorizing Provider  aspirin 325 MG EC tablet Take 325 mg by mouth daily.    Historical Provider, MD  colchicine 0.6 MG tablet Take 1 tablet (0.6 mg total) by mouth 2 (two) times daily as needed. 07/07/13   [name], NP  lisinopril-hydrochlorothiazide (PRINZIDE,ZESTORETIC) 10-12.5 MG per tablet Take 1 tablet by mouth daily. 07/28/13   [name], NP  methocarbamol (ROBAXIN) 500 MG tablet Take 2 tablets (1,000 mg total) by mouth 4 (four) times daily. 06/30/13   Carlisle Cater, PA-C  Multiple Vitamin (ONE-A-DAY MENS PO) Take 1 tablet by mouth.  Historical Provider, MD  oxyCODONE-acetaminophen (PERCOCET/ROXICET) 5-325 MG per tablet Take 1-2 tablets by mouth every 6 (six) hours as needed for severe pain. 06/30/13   Carlisle Cater, PA-C   BP 133/79  Pulse 105  Temp(Src) 98.3 F (36.8 C) (Oral)  Resp 18  SpO2 100% Physical Exam  Nursing note and vitals reviewed. Constitutional: He is oriented to person, place, and time. He appears well-developed and well-nourished. No distress.  HENT:  Head: Normocephalic and atraumatic.  Eyes: Conjunctivae are normal.  Fundoscopic exam:      The right eye shows no arteriolar narrowing, no AV nicking, no exudate, no hemorrhage and no papilledema. The right eye shows red reflex.       The left eye shows no arteriolar narrowing,  no AV nicking, no exudate, no hemorrhage and no papilledema. The left eye shows red reflex.  Visual acuity  OD 20/25 OS NONE  Visual fields full to confrontation in R eye. No vision in L.  Pressures  OD 18 OS 11  Patient has no constriction of the left pupil to direct light, Pupil does constrict with consensual (indirect) light stimulation to right eye.    Neck: Normal range of motion. Neck supple. No JVD present.  No audible carotid bruits  Cardiovascular: Normal rate, regular rhythm and normal heart sounds.   Pulmonary/Chest: Effort normal and breath sounds normal. No respiratory distress.  Abdominal: Soft. There is no tenderness.  Musculoskeletal: He exhibits no edema.  Neurological: He is alert and oriented to person, place, and time. He displays normal reflexes. He exhibits normal muscle tone. Coordination normal.  Speech is clear and goal oriented, follows commands Major Cranial nerves without deficit (except as described in the eye exam) no facial droop Normal strength in upper and lower extremities bilaterally including dorsiflexion and plantar flexion, strong and equal grip strength Sensation normal to light and sharp touch Moves extremities without ataxia, coordination intact Normal finger to nose and rapid alternating movements Neg romberg, no pronator drift Normal gait Normal heel-shin and balance   Skin: Skin is warm and dry. He is not diaphoretic.  Psychiatric: His behavior is normal.    ED Course  Procedures (including critical care time) Labs Review Labs Reviewed - No data to display  Imaging Review No results found.   EKG Interpretation None      Date: 09/13/2013  Rate: 85  Rhythm: normal sinus rhythm  QRS Axis: normal  Intervals: normal  ST/T Wave abnormalities: nonspecific T wave changes  Conduction Disutrbances:left anterior fascicular block  Narrative Interpretation:   Old EKG Reviewed: unchanged    MDM   Final diagnoses:  None     Patient here with acute visual loss in the L eye. I have spoken with Dr. Frederico Hamman (ophtho) who will see the patient here in the ED.  A bedside US was performed by myself and Dr. Doy Mince. No signs of retinal detachment . I have concern for acute retinal arterial occlusion considering his risk factors, normal fundoscopic exa,  There are no hemorrhages present on the back of the eye to suggest retinal vein occlusion.  I have also spoken with Dr. Aram Beecham of neurology. If this is an occlusion patient will need stroke work up. He will not need  A head CT. Dr. Aram Beecham suggests a MRI Brain w/wo cm and a MRI orbits with fat sparing. I have given report to Dr. Pattricia Boss who will assume care of the patient. Plan to wait for ophthalmologic consult. If the patient  Has arterial occlusion he will need admissio for stroke work up. Otherwise disposition according to Dr. Frederico Hamman.    Margarita Mail, PA-C 09/13/13 [place], PA-C 09/13/13 2048

## 2013-09-13 NOTE — ED Notes (Signed)
Transporting patient to new room assignment. 

## 2013-09-13 NOTE — ED Notes (Signed)
Pt reports yesterday having "blotches" in bialteal eyes. Today he states he woke up and his vision has gotten progressively worse in left eye. States "it was like a blind going down." Pt now reports "I can only see light in my left eye but it is practically black." Pupils equal, but left eye noted to be sluggish. Pt neuro intact, grips equal, no facial droop. Pt denies any pain to eyes, drainage or redness. Denies injury.

## 2013-09-13 NOTE — H&P (Signed)
History and Physical       Hospital Admission Note Date: 09/13/2013  Patient name: Travis [name] Medical record number: [id number] Date of birth: 1955-09-29 Age: 58 y.o. Gender: male  PCP: [name], NP    Chief Complaint:  sudden visual loss in the left eye  HPI:   Patient is a 58 year old male with history of hypertension, hyperlipidemia, diabetes, (diet controlled), gout, tobacco abuse, alcohol abuse presented to the ER with vision loss in the left eye. History was obtained from the patient who reported that he had noticed some spots in his left eye yesterday evening. This morning when he woke up, he noticed that the spot  Was enlarging and described as "smudge getting bigger". He went to the church and throughout the day, it progressively became worse to the point that he could not see anything from his left eye. Patient denied any pain in the eyes, drainage or any redness or any injury. He otherwise denied any focal weakness, dysarthria, any numbness or tingling. He denies any prior history of stroke or TIA.   Review of Systems:  Constitutional: Denies fever, chills, diaphoresis, poor appetite and fatigue.  HEENT: Denies photophobia, eye pain, redness, hearing loss, ear pain, congestion, sore throat, rhinorrhea, sneezing, mouth sores, trouble swallowing, neck pain, neck stiffness and tinnitus.   please see history of present illness  Respiratory: Denies SOB, DOE, cough, chest tightness,  and wheezing.   Cardiovascular: Denies chest pain, palpitations and leg swelling.  Gastrointestinal: Denies nausea, vomiting, abdominal pain, diarrhea, constipation, blood in stool and abdominal distention.  Genitourinary: Denies dysuria, urgency, frequency, hematuria, flank pain and difficulty urinating.  Musculoskeletal: Denies myalgias, back pain, joint swelling, arthralgias and gait problem.  Skin: Denies pallor, rash and wound.   Neurological: Denies dizziness, seizures, syncope, weakness, light-headedness, numbness and headaches.  please see history of present illness  Hematological: Denies adenopathy. Easy bruising, personal or family bleeding history  Psychiatric/Behavioral: Denies suicidal ideation, mood changes, confusion, nervousness, sleep disturbance and agitation  Past Medical History: Past Medical History  Diagnosis Date  . Systolic heart failure     EF 20-25% by echo 12/28/10  . Nonischemic cardiomyopathy     minimal coronary disease by cath 12/29/10  . Hemoptysis     secondary to pulmonary edema  . Hyperlipidemia   . Chronic anemia   . GI bleed     15 years ago  . Tobacco abuse   . Alcohol abuse   . Osteoarthritis   . Gout   . Diabetes mellitus, type 2     pt reports his DM is gone  . Alcoholic cardiomyopathy 61/60/7371   Past Surgical History  Procedure Laterality Date  . Cardiac catheterization    . Hip arthroplasty Right 03/15/2013    Procedure: ARTHROPLASTY BIPOLAR HIP;  Surgeon: [name], MD;  Location: [facility];  Service: Orthopedics;  Laterality: Right;    Medications: Prior to Admission medications   Medication Sig Start Date End Date Taking? Authorizing Provider  aspirin 325 MG EC tablet Take 325 mg by mouth daily.   Yes Historical Provider, MD  colchicine 0.6 MG tablet Take 1 tablet (0.6 mg total) by mouth 2 (two) times daily as needed. 07/07/13  Yes [name], NP  diclofenac (VOLTAREN) 50 MG EC tablet Take 50 mg by mouth daily as needed for mild pain.  08/06/13  Yes Historical Provider, MD  lisinopril-hydrochlorothiazide (PRINZIDE,ZESTORETIC) 10-12.5 MG per tablet Take 1 tablet by mouth daily. 07/28/13  Yes [name], NP  Multiple Vitamin (ONE-A-DAY MENS PO) Take 1 tablet by mouth.   Yes Historical Provider, MD  tetrahydrozoline 0.05 % ophthalmic solution Place 1 drop into both eyes daily as needed (Eye irritation).   Yes Historical Provider, MD    Allergies:  No  Known Allergies  Social History:  reports that he has been smoking Cigarettes.  He has a 22.5 pack-year smoking history. He does not have any smokeless tobacco history on file. He reports that he drinks about .6 ounces of alcohol per week. He reports that he does not use illicit drugs.  Family History: Family History  Problem Relation Age of Onset  . Diabetes Mother   . Hypertension Mother   . Diabetes Father   . Hypertension Father   . Cancer Neg Hx   . Heart disease Neg Hx   . Stroke Neg Hx     Physical Exam: Blood pressure 113/72, pulse 105, temperature 98.5 F (36.9 C), temperature source Oral, resp. rate 20, SpO2 98.00%. General: Alert, awake, oriented x3, in no acute distress, no facial drooping or dysarthria . HEENT: normocephalic, atraumatic, anicteric sclera, pink conjunctiva, oropharynx clear, left pupil slightly sluggish but no discharge or drainage Neck: supple, no masses or lymphadenopathy, no goiter, no bruits  Heart: Regular rate and rhythm, without murmurs, rubs or gallops. Lungs: Clear to auscultation bilaterally, no wheezing, rales or rhonchi. Abdomen: Soft, nontender, nondistended, positive bowel sounds, no masses. Extremities: No clubbing, cyanosis or edema with positive pedal pulses. Neuro: Grossly intact, no focal neurological deficits, strength 5/5 upper and lower extremities bilaterally, no pronator drift  Psych: alert and oriented x 3, normal mood and affect Skin: no rashes or lesions, warm and dry   LABS on Admission:  Basic Metabolic Panel:  Recent Labs Lab 09/13/13 1548 09/13/13 1607  NA 138 138  K 3.9 3.7  CL 98 100  CO2 26  --   GLUCOSE 110* 109*  BUN 16 16  CREATININE 1.40* 1.50*  CALCIUM 9.6  --    Liver Function Tests:  Recent Labs Lab 09/13/13 1548  AST 29  ALT 25  ALKPHOS 91  BILITOT 0.5  PROT 6.8  ALBUMIN 3.5   No results found for this basename: LIPASE, AMYLASE,  in the last 168 hours No results found for this basename:  AMMONIA,  in the last 168 hours CBC:  Recent Labs Lab 09/13/13 1548 09/13/13 1607  WBC 6.0  --   NEUTROABS 3.6  --   HGB 11.5* 12.6*  HCT 33.8* 37.0*  MCV 93.1  --   PLT 227  --    Cardiac Enzymes: No results found for this basename: CKTOTAL, CKMB, CKMBINDEX, TROPONINI,  in the last 168 hours BNP: No components found with this basename: POCBNP,  CBG: No results found for this basename: GLUCAP,  in the last 168 hours   Radiological Exams on Admission: No results found.  Assessment/Plan Principal Problem:  Visual loss, left eye likely due to  CRA (central retinal artery occlusion) - Patient has been seen by ophthalmology, Dr. Frederico Hamman and recommended hypercoagulability workup, aspirin daily, carotid Dopplers, echocardiogram - Neurology also has been consulted, I discussed with Dr. Aram Beecham who also recommended MRI of the brain, no need of MRA brain, 2-D echo, carotid Dopplers - Aspirin 325 mg daily - Will check lipid panel, hemoglobin A1c, hypercoagulability panel, ESR, CRP - OT consult - Urine drug screen positive for opiates and tetrahydrocannabinol  Active Problems:   DM - Diet controlled, check hemoglobin A1c  HYPERLIPIDEMIA - Will check lipid panel    Chronic systolic heart failure: - Currently stable, will check 2-D echo, patient is not on any diuretics    Smoker - Placed on nicotine patch, strongly counseled for smoking cessation    Alcohol abuse - Patient reports drinking on the weekends, will place on CIWA scale, thiamine, folic acid    Acute renal insufficiency with hypercalcemia - Baseline creatinine 1.0, presenting with creatinine of 1.5, placed on IV fluid hydration and reassess - Hold lisinopril, HCTZ, voltaren  DVT prophylaxis:  Lovenox  CODE STATUS:  Full code  Family Communication: Admission, patients condition and plan of care including tests being ordered have been discussed with the patient and  niece who indicates understanding and agree  with the plan and Code Status   Further plan will depend as patient's clinical course evolves and further radiologic and laboratory data become available.   Time Spent on Admission:  1 hour  RAI,RIPUDEEP M.D. [facility] 09/13/2013, 6:24 PM Pager: [phone]  If 7PM-7AM, please contact night-coverage [email or web address] Password [email or web address]  **Disclaimer: This note was dictated with voice recognition software. Similar sounding words can inadvertently be transcribed and this note may contain transcription errors which may not have been corrected upon publication of note.**

## 2013-09-13 NOTE — ED Provider Notes (Signed)
58 year old male with onset of decreased vision in left eye last night. Patient seen and evaluated by Margarita Mail and PA and Dr. Doy Mince. Patient was signed out to me pending ophthalmology consultation. Dr. Frederico Hamman has seen and evaluated the patient and diagnosed central retinal artery occlusion. I discussed the patient's care with Dr. Aram Beecham on call for neuro hospitalist.  He agrees with not obtaining further imaging of the patient at this time. He will consult on the patient. Patient is to be admitted to hospitalist for stroke evaluation.  Shaune Pollack, MD 09/13/13 1754

## 2013-09-13 NOTE — ED Notes (Signed)
NIH score reported to [employer]

## 2013-09-14 ENCOUNTER — Observation Stay (HOSPITAL_COMMUNITY): Payer: Medicare Other

## 2013-09-14 DIAGNOSIS — I517 Cardiomegaly: Secondary | ICD-10-CM | POA: Diagnosis not present

## 2013-09-14 DIAGNOSIS — H3410 Central retinal artery occlusion, unspecified eye: Secondary | ICD-10-CM | POA: Diagnosis not present

## 2013-09-14 DIAGNOSIS — H5460 Unqualified visual loss, one eye, unspecified: Secondary | ICD-10-CM | POA: Diagnosis not present

## 2013-09-14 LAB — HOMOCYSTEINE: HOMOCYSTEINE-NORM: 20.3 umol/L — AB (ref 4.0–15.4)

## 2013-09-14 LAB — SODIUM, URINE, RANDOM: SODIUM UR: 183 meq/L

## 2013-09-14 LAB — LIPID PANEL
CHOLESTEROL: 218 mg/dL — AB (ref 0–200)
HDL: 56 mg/dL (ref >39)
LDL Cholesterol: UNDETERMINED mg/dL (ref 0–99)
Total CHOL/HDL Ratio: 3.9 RATIO
Triglycerides: 544 mg/dL — ABNORMAL HIGH (ref <150)
VLDL: UNDETERMINED mg/dL (ref 0–40)

## 2013-09-14 LAB — HEMOGLOBIN A1C
Hgb A1c MFr Bld: 6.1 % — ABNORMAL HIGH (ref <5.7)
MEAN PLASMA GLUCOSE: 128 mg/dL — AB (ref <117)

## 2013-09-14 LAB — LUPUS ANTICOAGULANT PANEL
DRVVT: 46.5 secs — ABNORMAL HIGH (ref <42.9)
Lupus Anticoagulant: NOT DETECTED
PTT Lupus Anticoagulant: 34.8 secs (ref 28.0–43.0)
dRVVT Incubated 1:1 Mix: 40.3 secs (ref <42.9)

## 2013-09-14 LAB — URINALYSIS, ROUTINE W REFLEX MICROSCOPIC
Bilirubin Urine: NEGATIVE
GLUCOSE, UA: NEGATIVE mg/dL
HGB URINE DIPSTICK: NEGATIVE
Ketones, ur: NEGATIVE mg/dL
LEUKOCYTES UA: NEGATIVE
Nitrite: NEGATIVE
Protein, ur: NEGATIVE mg/dL
SPECIFIC GRAVITY, URINE: 1.027 (ref 1.005–1.030)
Urobilinogen, UA: 0.2 mg/dL (ref 0.0–1.0)
pH: 5.5 (ref 5.0–8.0)

## 2013-09-14 LAB — RAPID URINE DRUG SCREEN, HOSP PERFORMED
AMPHETAMINES: NOT DETECTED
Barbiturates: NOT DETECTED
Benzodiazepines: NOT DETECTED
COCAINE: NOT DETECTED
OPIATES: POSITIVE — AB
TETRAHYDROCANNABINOL: POSITIVE — AB

## 2013-09-14 LAB — CARDIOLIPIN ANTIBODIES, IGG, IGM, IGA
ANTICARDIOLIPIN IGM: 0 [MPL'U]/mL — AB (ref <11)
Anticardiolipin IgA: 3 APL U/mL — ABNORMAL LOW (ref <22)
Anticardiolipin IgG: 0 GPL U/mL — ABNORMAL LOW (ref <23)

## 2013-09-14 LAB — CREATININE, URINE, RANDOM: Creatinine, Urine: 215.27 mg/dL

## 2013-09-14 LAB — GLUCOSE, CAPILLARY
GLUCOSE-CAPILLARY: 133 mg/dL — AB (ref 70–99)
Glucose-Capillary: 146 mg/dL — ABNORMAL HIGH (ref 70–99)
Glucose-Capillary: 72 mg/dL (ref 70–99)
Glucose-Capillary: 133 mg/dL — ABNORMAL HIGH (ref 70–99)

## 2013-09-14 LAB — C-REACTIVE PROTEIN: CRP: 2.2 mg/dL — ABNORMAL HIGH (ref <0.60)

## 2013-09-14 LAB — OSMOLALITY: OSMOLALITY: 287 mosm/kg (ref 275–300)

## 2013-09-14 LAB — PROTEIN S ACTIVITY: Protein S Activity: 166 % — ABNORMAL HIGH (ref 69–129)

## 2013-09-14 LAB — PROTEIN C, TOTAL: Protein C, Total: 135 % (ref 72–160)

## 2013-09-14 LAB — PROTEIN C ACTIVITY: Protein C Activity: 200 % — ABNORMAL HIGH (ref 75–133)

## 2013-09-14 LAB — BETA-2-GLYCOPROTEIN I ABS, IGG/M/A
BETA-2-GLYCOPROTEIN I IGM: 2 M Units (ref <20)
Beta-2 Glyco I IgG: 0 G Units (ref <20)
Beta-2-Glycoprotein I IgA: 6 A Units (ref <20)

## 2013-09-14 LAB — OSMOLALITY, URINE: OSMOLALITY UR: 807 mosm/kg (ref 390–1090)

## 2013-09-14 LAB — PROTEIN S, TOTAL: PROTEIN S AG TOTAL: 133 % (ref 60–150)

## 2013-09-14 MED ORDER — FENOFIBRATE 54 MG PO TABS
54.0000 mg | ORAL_TABLET | Freq: Every day | ORAL | Status: DC
  Administered 2013-09-14 – 2013-09-16 (×3): 54 mg via ORAL
  Filled 2013-09-14 (×3): qty 1

## 2013-09-14 MED ORDER — ATORVASTATIN CALCIUM 10 MG PO TABS
10.0000 mg | ORAL_TABLET | Freq: Every day | ORAL | Status: DC
  Administered 2013-09-14 – 2013-09-15 (×2): 10 mg via ORAL
  Filled 2013-09-14 (×2): qty 1

## 2013-09-14 MED ORDER — FOLIC ACID 1 MG PO TABS: 1.0000 mg | ORAL_TABLET | Freq: Every day | ORAL | Status: DC

## 2013-09-14 MED ORDER — OXYCODONE-ACETAMINOPHEN 5-325 MG PO TABS
1.0000 | ORAL_TABLET | Freq: Three times a day (TID) | ORAL | Status: DC | PRN
  Administered 2013-09-14 – 2013-09-16 (×4): 1 via ORAL
  Filled 2013-09-14 (×4): qty 1

## 2013-09-14 MED ORDER — INSULIN ASPART 100 UNIT/ML ~~LOC~~ SOLN
0.0000 [IU] | Freq: Three times a day (TID) | SUBCUTANEOUS | Status: DC
  Administered 2013-09-14 – 2013-09-15 (×3): 1 [IU] via SUBCUTANEOUS
  Administered 2013-09-15: 2 [IU] via SUBCUTANEOUS

## 2013-09-14 NOTE — Progress Notes (Signed)
VASCULAR LAB PRELIMINARY  PRELIMINARY  PRELIMINARY  PRELIMINARY  Carotid duplex completed.    Preliminary report:  Bilateral:  1-39% ICA stenosis.  Vertebral artery flow is antegrade.     Travis [name], RVS 09/14/2013, 10:55 AM

## 2013-09-14 NOTE — Progress Notes (Addendum)
Occupational Therapy Treatment Patient Details Name: Travis [name] MRN: [id number] DOB: 02/01/1956 Today's Date: 09/14/2013    History of present illness 58 y.o. s/p central retinal artery occlusion.   OT comments  Recommended no driving and explained safety issue with this. Educated on safety in community such as being careful with curbs and in movie theatres due to decreased visual depth perception. Explained benefit of activities OT gave him to do to help him relearn depth perception and help him with quick reactions. Did educate on signs/symptoms of stroke and advised to stop smoking.  Follow Up Recommendations  Outpatient OT    Equipment Recommendations  None recommended by OT    Recommendations for Other Services      Precautions / Restrictions Restrictions Weight Bearing Restrictions: No       Mobility           Balance                                            Perception     Praxis      Cognition   Behavior During Therapy: WFL for tasks assessed/performed Overall Cognitive Status: Within Functional Limits for tasks assessed                         Exercises Other Exercises Other Exercises: Performed ball toss activity and educated why this is beneficial. Also, educated that he could toss balloon.    Shoulder Instructions       General Comments      Pertinent Vitals/ Pain       No pain reported.   Home Living Family/patient expects to be discharged to:: Private residence Living Arrangements: Other relatives Available Help at Discharge: Family Type of Home: House Home Access: Stairs to enter [employer] brewer of Steps:  (3 and 4) Entrance Stairs-Rails: Right Home Layout: One level     Bathroom Shower/Tub: Teacher, early years/pre: Standard     Home Equipment: [employer] - single point          Prior Functioning/Environment Level of Independence: Independent            Frequency Min 2X/week      Progress Toward Goals  OT Goals(current goals can now be found in the care plan section)  Progress towards OT goals: Progressing toward goals  Acute Rehab OT Goals Patient Stated Goal: not stated OT Goal Formulation: With patient Time For Goal Achievement: 09/21/13 Potential to Achieve Goals: Good ADL Goals Additional ADL Goal #1: Pt will be independent with vision HEP to assist in depth perception.   Plan Discharge plan remains appropriate    Co-evaluation                 End of Session     Activity Tolerance Patient tolerated treatment well   Patient Left in bed   Nurse Communication      Functional Assessment Tool Used: clinical judgment Functional Limitation: Self care Self Care Current Status [id number]): At least 1 percent but less than 20 percent impaired, limited or restricted Self Care Goal Status [id number]): 0 percent impaired, limited or restricted   Time: 1427-1440 OT Time Calculation (min): 13 min  Charges:  OT General Charges $OT Visit: 1 Procedure OT Treatments $Therapeutic Activity: 8-22 mins  Benito Mccreedy OTR/L [id number] 09/14/2013, 3:44 PM

## 2013-09-14 NOTE — ED Provider Notes (Signed)
Medical screening examination/treatment/procedure(s) were conducted as a shared visit with non-physician practitioner(s) and myself.  I personally evaluated the patient during the encounter.   Please see my separate note.     Houston Siren III, MD 09/14/13 [phone]

## 2013-09-14 NOTE — Evaluation (Addendum)
Occupational Therapy Evaluation Patient Details Name: Travis [name] MRN: [id number] DOB: May 19, 1955 Today's Date: 09/14/2013    History of Present Illness 58 y.o. s/p central retinal artery occlusion.   Clinical Impression   Pt s/p above. Education provided during session. Recommending Outpatient OT for followup.     Follow Up Recommendations  Outpatient OT    Equipment Recommendations  None recommended by OT    Recommendations for Other Services       Precautions / Restrictions Restrictions Weight Bearing Restrictions: No      Mobility Bed Mobility Overal bed mobility: Independent                Transfers Overall transfer level: Independent                    Balance  No apparent balance deficits                                           ADL Overall ADL's : Needs assistance/impaired             Lower Body Bathing: Modified independent (standing)       Lower Body Dressing: Modified independent;Sit to/from stand   Toilet Transfer: Modified Independent       Tub/ Shower Transfer: Supervision/safety   Functional mobility during ADLs: Modified independent;Supervision/safety General ADL Comments: Pt states he feels balance is a little off. Recommended pt sit for LB dressing. Pt performed ball toss activity a few times. Recommended no driving.  Pt also practiced steps a few times in gym.     Vision       Tracking/Visual Pursuits: Other (comment) (lost it in left inferior quadrant)      Visual Fields: WFL with both eyes open  Pt with loss of central vision in left eye       Perception     Praxis      Pertinent Vitals/Pain No pain reported.      Hand Dominance     Extremity/Trunk Assessment Upper Extremity Assessment Upper Extremity Assessment: Overall WFL for tasks assessed   Lower Extremity Assessment Lower Extremity Assessment: Overall WFL for tasks assessed       Communication  Communication Communication: No difficulties   Cognition Arousal/Alertness: Awake/alert Behavior During Therapy: WFL for tasks assessed/performed Overall Cognitive Status: Within Functional Limits for tasks assessed                     General Comments       Exercises       Shoulder Instructions      Home Living Family/patient expects to be discharged to:: Private residence Living Arrangements: Other relatives Available Help at Discharge: Family Type of Home: House Home Access: Stairs to enter [employer] of Steps:  (3 and 4) Entrance Stairs-Rails: Right Home Layout: One level     Bathroom Shower/Tub: Teacher, early years/pre: Standard     Home Equipment: Cane - single point          Prior Functioning/Environment Level of Independence: Independent             OT Diagnosis: Disturbance of vision   OT Problem List: Impaired vision/perception;Impaired balance (sitting and/or standing) (pt states he feels balance is off-no LOB in session)   OT Treatment/Interventions: Therapeutic activities;Balance training;Visual/perceptual remediation/compensation;Patient/family education;Therapeutic exercise    OT Goals(Current goals can  be found in the care plan section) Acute Rehab OT Goals Patient Stated Goal: not stated OT Goal Formulation: With patient Time For Goal Achievement: 09/21/13 Potential to Achieve Goals: Good ADL Goals Additional ADL Goal #1: Pt will be independent with vision HEP to assist in depth perception.   OT Frequency: Min 2X/week   Barriers to D/C:            Co-evaluation              End of Session    Activity Tolerance: Patient tolerated treatment well Patient left: up in room    Time: 1400-1415 OT Time Calculation (min): 15 min Charges:  OT General Charges $OT Visit: 1 Procedure OT Evaluation $Initial OT Evaluation Tier I: 1 Procedure G-Codes: OT G-codes **NOT FOR INPATIENT CLASS** Functional  Assessment Tool Used: clinical judgment Functional Limitation: Self care Self Care Current Status [id number]): At least 1 percent but less than 20 percent impaired, limited or restricted Self Care Goal Status [id number]): 0 percent impaired, limited or restricted  Benito Mccreedy OTR/L [id number] 09/14/2013, 3:39 PM

## 2013-09-14 NOTE — ED Provider Notes (Signed)
Medical screening examination/treatment/procedure(s) were conducted as a shared visit with non-physician practitioner(s) and myself.  I personally evaluated the patient during the encounter.   EKG Interpretation   Date/Time:  Sunday September 13 2013 15:50:02 EDT Ventricular Rate:  85 PR Interval:  179 QRS Duration: 119 QT Interval:  401 QTC Calculation: 477 R Axis:   -48 Text Interpretation:  Sinus rhythm Probable left atrial enlargement Left  anterior fascicular block Nonspecific T abnormalities, diffuse leads  Baseline wander in lead(s) V5 V6 compared to prior, LAFB pattern more  prominent Confirmed by WOFFORD  MD, TREY ([id number]) on 09/14/2013 9:35:22 AM      57  yo male presenting with decreased vision in left eye.  Saw floaters yesterday, then decreased vision today.  On exam, well appearing, nontoxic, not distressed, normal respiratory effort, normal perfusion, EOMI, left pupil poorly reactive to direct light, but consensual response intact, normal visual acuity on right with grossly decreased visual acuity on left.  Ophthalmology consulted and diagnosed CRAO.  Admitted for further workup.    Clinical Impression: 1. CRA (central retinal artery occlusion), left      Houston Siren III, MD 09/14/13 [phone]

## 2013-09-14 NOTE — Progress Notes (Signed)
Echo Lab  2D Echocardiogram completed.  Travis [name] L Ethell Blatchford, RDCS 09/14/2013 11:12 AM

## 2013-09-14 NOTE — Progress Notes (Addendum)
NEURO HOSPITALIST PROGRESS NOTE   SUBJECTIVE:                                                                                                                        Doing better today, stated that " some of my vision is coming back". MRI brain showed no acute intracranial abnormality. TTE, CUS pending. Serum homocysteine 20.3, AntiThromb III normal.Remaining hypercoagulable profile pending. UDS significant for marihuana and opiates.   On aspirin 325 mg daily.  OBJECTIVE:                                                                                                                           Vital signs in last 24 hours: Temp:  [97.8 F (36.6 C)-98.5 F (36.9 C)] 97.8 F (36.6 C) (06/22 0537) Pulse Rate:  [87-105] 92 (06/22 0537) Resp:  [10-24] 18 (06/22 0537) BP: (110-133)/(71-82) 128/82 mmHg (06/22 0537) SpO2:  [94 %-100 %] 96 % (06/22 0537) Weight:  [78.699 kg (173 lb 8 oz)] 78.699 kg (173 lb 8 oz) (06/21 1922)  Intake/Output from previous day:   Intake/Output this shift:   Nutritional status: Cardiac  Past Medical History  Diagnosis Date  . Systolic heart failure     EF 20-25% by echo 12/28/10  . Nonischemic cardiomyopathy     minimal coronary disease by cath 12/29/10  . Hemoptysis     secondary to pulmonary edema  . Hyperlipidemia   . Chronic anemia   . GI bleed     15 years ago  . Tobacco abuse   . Alcohol abuse   . Osteoarthritis   . Gout   . Diabetes mellitus, type 2     pt reports his DM is gone  . Alcoholic cardiomyopathy [id number]    Neurologic Exam:  Mental Status:  Alert, oriented, thought content appropriate. Speech fluent without evidence of aphasia. Able to follow 3 step commands without difficulty.  Cranial Nerves:  II: Discs flat bilaterally; pupils 2-3 mm bilaterally with left APD. Visual acuity:OD : 20/60 : OS: 20/800.  III,IV, VI: ptosis not present, extra-ocular motions intact bilaterally  V,VII: smile  symmetric, facial light touch sensation normal bilaterally  VIII: hearing normal bilaterally  IX,X: gag reflex present  XI:  bilateral shoulder shrug  XII: midline tongue extension without atrophy or fasciculations  Motor:  Right : Upper extremity 5/5 Left: Upper extremity 5/5  Lower extremity 5/5 Lower extremity 5/5  Tone and bulk:normal tone throughout; no atrophy noted  Sensory: Pinprick and light touch intact throughout, bilaterally  Deep Tendon Reflexes:  Right: Upper Extremity Left: Upper extremity  biceps (C-5 to C-6) 2/4 biceps (C-5 to C-6) 2/4  tricep (C7) 2/4 triceps (C7) 2/4  Brachioradialis (C6) 2/4 Brachioradialis (C6) 2/4  Lower Extremity Lower Extremity  quadriceps (L-2 to L-4) 2/4 quadriceps (L-2 to L-4) 2/4  Achilles (S1) 2/4 Achilles (S1) 2/4  Plantars:  Right: downgoing Left: downgoing  Cerebellar:  normal finger-to-nose, normal heel-to-shin test.  Gait:  No tested   Lab Results: Lab Results  Component Value Date/Time   CHOL 218* 09/14/2013  5:10 AM   Lipid Panel  Recent Labs  09/14/13 0510  CHOL 218*  TRIG 544*  HDL 56  CHOLHDL 3.9  VLDL UNABLE TO CALCULATE IF TRIGLYCERIDE OVER 400 mg/dL  LDLCALC UNABLE TO CALCULATE IF TRIGLYCERIDE OVER 400 mg/dL    Studies/Results: Mri Brain Without Contrast  09/14/2013   CLINICAL DATA:  Left-sided visual loss.  EXAM: MRI HEAD WITHOUT CONTRAST  TECHNIQUE: Multiplanar, multiecho pulse sequences of the brain and surrounding structures were obtained without intravenous contrast.  COMPARISON:  CT 03/14/2013  FINDINGS: Diffusion imaging does not show any acute or subacute infarction. The brain in general shows mild atrophy. There is no focal insult seen affecting the brainstem or cerebellum. The cerebral hemispheres show foci of T2 and FLAIR signal within the deep white matter consistent with chronic small vessel disease. This is most pronounced in the white matter adjacent to the atrium of the right lateral ventricle, but  this should be above the optic radiations. There is no sign of mass lesion, hemorrhage, hydrocephalus or extra-axial collection. No pituitary mass. As visualized on this standard brain MRI, the orbital contents appear normal. There is mild mucosal inflammation of the paranasal sinuses, possibly subclinical.  IMPRESSION: No acute finding. No specific lesion to explain left-sided visual loss. Ordinary chronic appearing small vessel change of the cerebral hemispheric white matter.   Electronically Signed   By: Nelson Chimes M.D.   On: 09/14/2013 07:38    MEDICATIONS                                                                                                                        Scheduled: . aspirin  325 mg Oral Daily  . enoxaparin (LOVENOX) injection  30 mg Subcutaneous Q24H  . folic acid  1 mg Oral Daily  . multivitamin with minerals  1 tablet Oral Daily  . nicotine  21 mg Transdermal Q24H  . thiamine  100 mg Oral Daily   Or  . thiamine  100 mg Intravenous Daily    ASSESSMENT/PLAN:  7 with multiple risk factors for stroke admitted with left CRAO. Doing better today. Elevated homocysteine: folic acid 1 mg daily. Stressed risk factors control. Awaiting for CUS, TTE, completion of hypercoagulable profile.   Dorian Pod, MD [facility] [phone]  09/14/2013, 8:58 AM

## 2013-09-14 NOTE — Progress Notes (Signed)
PT Cancellation Note  Patient Details Name: Travis [name] MRN: [id number] DOB: 1956-03-02   Cancelled Treatment:    Reason Eval/Treat Not Completed: PT screened, no needs identified, will sign off. Per OT pt with no acute PT needs at this time. Please re-consult if needed in future.   Kingsley Callander 09/14/2013, 2:25 PM  Kittie Plater, PT, DPT Pager #: [phone] Office #: [phone]

## 2013-09-14 NOTE — Progress Notes (Signed)
Utilization review completed.  

## 2013-09-14 NOTE — Progress Notes (Signed)
Patient Demographics  Travis [name], is a 58 y.o. male, DOB - 1955-08-01, [id number]  Admit date - 09/13/2013   Admitting Physician Ripudeep Krystal Eaton, MD  Outpatient Primary MD for the patient is [name], NP  LOS - 1   Chief Complaint  Patient presents with  . Eye Problem           Subjective:   Travis [name] today has, No headache, No chest pain, No abdominal pain - No Nausea, No new weakness tingling or numbness, No Cough - SOB. Continues to have left-sided vision loss    Assessment & Plan    Visual loss, left eye likely due to CRA (central retinal artery occlusion)   - Seen by ophthalmology and neurology. Full stroke workup underway which will include an MRI MRA brain, carotid duplex and echo gram. He was on aspirin full dose prior to admission which is being continued. We'll defer antiplatelet medication to neurology. Lipid panel noted and placed on TriCor along with statin, we'll be seen by PT-OT and speech. Further recommendations per neurology.     ? Type 2DM   - Likely prediabetic, Diet controlled, noted A1c, placed on sliding scale, monitor  Lab Results  Component Value Date   HGBA1C 6.1* 09/13/2013    CBG (last 3)   Recent Labs  09/13/13 2135  GLUCAP 182*      HYPERLIPIDEMIA noted, will add try core along with low dose statin  Lab Results  Component Value Date   CHOL 218* 09/14/2013   HDL 56 09/14/2013   LDLCALC UNABLE TO CALCULATE IF TRIGLYCERIDE OVER 400 mg/dL 09/14/2013   TRIG 544* 09/14/2013   CHOLHDL 3.9 09/14/2013        Chronic systolic heart failure:  - Currently stable, will check 2-D echo, currently compensated     Smoker  - Placed on nicotine patch, strongly counseled for smoking cessation     Alcohol abuse  - Patient reports  drinking on the weekends, will place on CIWA scale, thiamine, folic acid , counseled against heavy drinking    Acute renal insufficiency with hypercalcemia  - Baseline creatinine 1.0, presenting with creatinine of 1.5, placed on IV fluid hydration continue to hold HCTZ, lisinopril and voltaren, check bladder scan      Code Status: Full  Family Communication: None present  Disposition Plan: Home   Procedures MRI MRA brain, carotid duplex, echogram   Consults  ophthalmology, neurology   Medications  Scheduled Meds: . aspirin  325 mg Oral Daily  . enoxaparin (LOVENOX) injection  30 mg Subcutaneous Q24H  . folic acid  1 mg Oral Daily  . multivitamin with minerals  1 tablet Oral Daily  . nicotine  21 mg Transdermal Q24H  . thiamine  100 mg Oral Daily   Or  . thiamine  100 mg Intravenous Daily   Continuous Infusions: . sodium chloride 75 mL/hr at 09/13/13 2053   PRN Meds:.acetaminophen, LORazepam, LORazepam, oxyCODONE-acetaminophen  DVT Prophylaxis  Lovenox    Lab Results  Component Value Date   PLT 227 09/13/2013    Antibiotics     Anti-infectives   None          Objective:   Filed Vitals:   09/13/13 1922 09/14/13 0135  09/14/13 0537 09/14/13 0942  BP: 125/75 127/77 128/82 130/85  Pulse: 87 88 92 91  Temp: 98 F (36.7 C) 98 F (36.7 C) 97.8 F (36.6 C) 98.1 F (36.7 C)  TempSrc: Oral Oral Oral Oral  Resp: 18 18 18 18   Height: 6\' 4"  (1.93 m)     Weight: 78.699 kg (173 lb 8 oz)     SpO2: 98% 96% 96% 98%    Wt Readings from Last 3 Encounters:  09/13/13 78.699 kg (173 lb 8 oz)  07/06/13 80.74 kg (178 lb)  03/25/13 80.1 kg (176 lb 9.4 oz)    No intake or output data in the 24 hours ending 09/14/13 0957   Physical Exam  Awake Alert, Oriented X 3, No new F.N deficits, Normal affect, and is to have left-sided visual loss [place].AT,PERRAL Supple Neck,No JVD, No cervical lymphadenopathy appriciated.  Symmetrical Chest wall movement, Good air  movement bilaterally, CTAB RRR,No Gallops,Rubs or new Murmurs, No Parasternal Heave +ve B.Sounds, Abd Soft, No tenderness, No organomegaly appriciated, No rebound - guarding or rigidity. No Cyanosis, Clubbing or edema, No new Rash or bruise      Data Review   Micro Results No results found for this or any previous visit (from the past 240 hour(s)).  Radiology Reports Mri Brain Without Contrast  09/14/2013   CLINICAL DATA:  Left-sided visual loss.  EXAM: MRI HEAD WITHOUT CONTRAST  TECHNIQUE: Multiplanar, multiecho pulse sequences of the brain and surrounding structures were obtained without intravenous contrast.  COMPARISON:  CT 03/14/2013  FINDINGS: Diffusion imaging does not show any acute or subacute infarction. The brain in general shows mild atrophy. There is no focal insult seen affecting the brainstem or cerebellum. The cerebral hemispheres show foci of T2 and FLAIR signal within the deep white matter consistent with chronic small vessel disease. This is most pronounced in the white matter adjacent to the atrium of the right lateral ventricle, but this should be above the optic radiations. There is no sign of mass lesion, hemorrhage, hydrocephalus or extra-axial collection. No pituitary mass. As visualized on this standard brain MRI, the orbital contents appear normal. There is mild mucosal inflammation of the paranasal sinuses, possibly subclinical.  IMPRESSION: No acute finding. No specific lesion to explain left-sided visual loss. Ordinary chronic appearing small vessel change of the cerebral hemispheric white matter.   Electronically Signed   By: Nelson Chimes M.D.   On: 09/14/2013 07:38    CBC  Recent Labs Lab 09/13/13 1548 09/13/13 1607  WBC 6.0  --   HGB 11.5* 12.6*  HCT 33.8* 37.0*  PLT 227  --   MCV 93.1  --   MCH 31.7  --   MCHC 34.0  --   RDW 17.1*  --   LYMPHSABS 1.9  --   MONOABS 0.3  --   EOSABS 0.3  --   BASOSABS 0.0  --     Chemistries   Recent Labs Lab  09/13/13 1548 09/13/13 1607  NA 138 138  K 3.9 3.7  CL 98 100  CO2 26  --   GLUCOSE 110* 109*  BUN 16 16  CREATININE 1.40* 1.50*  CALCIUM 9.6  --   AST 29  --   ALT 25  --   ALKPHOS 91  --   BILITOT 0.5  --    ------------------------------------------------------------------------------------------------------------------ estimated creatinine clearance is 60.5 ml/min (by C-G formula based on Cr of 1.5). ------------------------------------------------------------------------------------------------------------------  Recent Labs  09/13/13 2038  HGBA1C 6.1*   ------------------------------------------------------------------------------------------------------------------  Recent Labs  09/14/13 0510  CHOL 218*  HDL 56  LDLCALC UNABLE TO CALCULATE IF TRIGLYCERIDE OVER 400 mg/dL  TRIG 544*  CHOLHDL 3.9   ------------------------------------------------------------------------------------------------------------------ No results found for this basename: TSH, T4TOTAL, FREET3, T3FREE, THYROIDAB,  in the last 72 hours ------------------------------------------------------------------------------------------------------------------ No results found for this basename: VITAMINB12, FOLATE, FERRITIN, TIBC, IRON, RETICCTPCT,  in the last 72 hours  Coagulation profile  Recent Labs Lab 09/13/13 1548  INR 0.96    No results found for this basename: DDIMER,  in the last 72 hours  Cardiac Enzymes No results found for this basename: CK, CKMB, TROPONINI, MYOGLOBIN,  in the last 168 hours ------------------------------------------------------------------------------------------------------------------ No components found with this basename: POCBNP,      Time Spent in minutes  35   SINGH,PRASHANT K M.D on 09/14/2013 at 9:57 AM  Between 7am to 7pm - Pager - [phone]  After 7pm go to [email or web address] - password [email or web address]  And look for the night coverage person covering for me  after hours  [facility] Office  [phone]   **Disclaimer: This note may have been dictated with voice recognition software. Similar sounding words can inadvertently be transcribed and this note may contain transcription errors which may not have been corrected upon publication of note.**

## 2013-09-14 NOTE — Progress Notes (Signed)
Medicine and rehabilitation consult requested. Medical workup currently underway. Physical and occupational therapy evaluations are pending. Will followup with formal rehabilitation consult and await recommendations

## 2013-09-15 DIAGNOSIS — I509 Heart failure, unspecified: Secondary | ICD-10-CM | POA: Diagnosis not present

## 2013-09-15 DIAGNOSIS — H3410 Central retinal artery occlusion, unspecified eye: Secondary | ICD-10-CM | POA: Diagnosis not present

## 2013-09-15 LAB — URINE CULTURE
Colony Count: NO GROWTH
Culture: NO GROWTH

## 2013-09-15 LAB — BASIC METABOLIC PANEL
BUN: 15 mg/dL (ref 6–23)
CALCIUM: 9.1 mg/dL (ref 8.4–10.5)
CO2: 26 mEq/L (ref 19–32)
CREATININE: 0.92 mg/dL (ref 0.50–1.35)
Chloride: 98 mEq/L (ref 96–112)
GFR calc Af Amer: >90 mL/min (ref >90)
Glucose, Bld: 116 mg/dL — ABNORMAL HIGH (ref 70–99)
Potassium: 3.7 mEq/L (ref 3.7–5.3)
Sodium: 138 mEq/L (ref 137–147)

## 2013-09-15 LAB — VITAMIN B12: VITAMIN B 12: 558 pg/mL (ref 211–911)

## 2013-09-15 LAB — GLUCOSE, CAPILLARY
GLUCOSE-CAPILLARY: 155 mg/dL — AB (ref 70–99)
GLUCOSE-CAPILLARY: 128 mg/dL — AB (ref 70–99)
GLUCOSE-CAPILLARY: 106 mg/dL — AB (ref 70–99)
Glucose-Capillary: 119 mg/dL — ABNORMAL HIGH (ref 70–99)

## 2013-09-15 MED ORDER — ENOXAPARIN SODIUM 40 MG/0.4ML ~~LOC~~ SOLN
40.0000 mg | SUBCUTANEOUS | Status: DC
  Administered 2013-09-15: 40 mg via SUBCUTANEOUS

## 2013-09-15 MED ORDER — PERFLUTREN LIPID MICROSPHERE
1.0000 mL | INTRAVENOUS | Status: AC | PRN
  Administered 2013-09-15: 6 mL via INTRAVENOUS
  Filled 2013-09-15: qty 10

## 2013-09-15 NOTE — Progress Notes (Signed)
Utilization review completed.  

## 2013-09-15 NOTE — Progress Notes (Signed)
Patient Demographics  Travis [name], is a 58 y.o. male, DOB - September 27, 1955, [id number]  Admit date - 09/13/2013   Admitting Physician Ripudeep Krystal Eaton, MD  Outpatient Primary MD for the patient is [name], NP  LOS - 2   Chief Complaint  Patient presents with  . Eye Problem        Subjective:    Travis [name] today has, No headache, No chest pain, No abdominal pain - No Nausea, No new weakness tingling or numbness, No Cough - SOB. Continues to have left-sided vision loss    Assessment & Plan    Visual loss, left eye likely due to CRA (central retinal artery occlusion)   - Seen by ophthalmology and neurology. Full stroke workup underway which will include an MRI MRA brain, carotid duplex and echo gram. He was on aspirin full dose prior to admission which is being continued. We'll defer antiplatelet medication to neurology. Lipid panel noted and placed on TriCor along with statin, been seen by PT-OT and speech. Further recommendations per neurology.    Note hypercoagulable panel full testing is pending. D/W Dr Benay Spice with hematology as well including lupus anticoagulant which was negative besides DRVVT slightly high, per Dr. Benay Spice follow the pending Factor 5 and Prothrombin gene pending. Slightly high at homocystine unremarkable per hematologist. Burnis Medin check B12 levels.    Echo  Repeat pending    Carotids  Preliminary report: Bilateral: 1-39% ICA stenosis. Vertebral artery flow is antegrade.      ? Type 2DM   - Likely prediabetic, Diet controlled, noted A1c, placed on sliding scale, monitor  Lab Results  Component Value Date   HGBA1C 6.1* 09/13/2013    CBG (last 3)   Recent Labs  09/14/13 1938 09/14/13 2140 09/15/13 0650  GLUCAP 72 133* 119*       HYPERLIPIDEMIA noted, will add try core along with low dose statin  Lab Results  Component Value Date   CHOL 218* 09/14/2013   HDL 56 09/14/2013   LDLCALC UNABLE TO CALCULATE IF TRIGLYCERIDE OVER 400 mg/dL 09/14/2013   TRIG 544* 09/14/2013   CHOLHDL 3.9 09/14/2013        Chronic systolic heart failure:  - Currently stable, will check 2-D echo, currently compensated     Smoker  - Placed on nicotine patch, strongly counseled for smoking cessation     Alcohol abuse  - Patient reports drinking on the weekends, will place on CIWA scale, thiamine, folic acid , counseled against heavy drinking    Acute renal insufficiency with hypercalcemia  - Baseline creatinine 1.0, presenting with creatinine of 1.5, placed on IV fluid hydration continue to hold HCTZ, lisinopril and voltaren, check bladder scan    Elevated homocystine level.  Check B12      Code Status: Full  Family Communication: None present  Disposition Plan: Home   Procedures MRI MRA brain, carotid duplex, echogram   Consults  ophthalmology, neurology   Medications  Scheduled Meds: . aspirin  325 mg Oral Daily  . atorvastatin  10 mg Oral q1800  . enoxaparin (LOVENOX) injection  30 mg Subcutaneous Q24H  . fenofibrate  54 mg Oral Daily  . folic acid  1 mg Oral Daily  . insulin aspart  0-9 Units Subcutaneous TID WC  . multivitamin with minerals  1 tablet Oral Daily  . nicotine  21 mg Transdermal Q24H  . thiamine  100 mg Oral Daily   Or  . thiamine  100 mg Intravenous Daily   Continuous Infusions: . sodium chloride 75 mL/hr at 09/13/13 2053   PRN Meds:.acetaminophen, LORazepam, LORazepam, oxyCODONE-acetaminophen, perflutren lipid microspheres (DEFINITY) IV suspension  DVT Prophylaxis  Lovenox    Lab Results  Component Value Date   PLT 227 09/13/2013    Antibiotics     Anti-infectives   None          Objective:   Filed Vitals:   09/14/13 1748 09/14/13 2119 09/15/13 0140  09/15/13 0515  BP: 133/86 148/88 150/82 136/85  Pulse: 84 80 91 83  Temp: 98 F (36.7 C) 98.6 F (37 C) 98.5 F (36.9 C) 98.6 F (37 C)  TempSrc: Oral Oral Oral Oral  Resp: 18 18 18 18   Height:      Weight:      SpO2: 97% 98% 99% 94%    Wt Readings from Last 3 Encounters:  09/13/13 78.699 kg (173 lb 8 oz)  07/06/13 80.74 kg (178 lb)  03/25/13 80.1 kg (176 lb 9.4 oz)     Intake/Output Summary (Last 24 hours) at 09/15/13 1037 Last data filed at 09/14/13 1820  Gross per 24 hour  Intake    440 ml  Output      0 ml  Net    440 ml     Physical Exam  Awake Alert, Oriented X 3, No new F.N deficits, Normal affect, and is to have left-sided visual loss [place].AT,PERRAL Supple Neck,No JVD, No cervical lymphadenopathy appriciated.  Symmetrical Chest wall movement, Good air movement bilaterally, CTAB RRR,No Gallops,Rubs or new Murmurs, No Parasternal Heave +ve B.Sounds, Abd Soft, No tenderness, No organomegaly appriciated, No rebound - guarding or rigidity. No Cyanosis, Clubbing or edema, No new Rash or bruise      Data Review   Micro Results Recent Results (from the past 240 hour(s))  URINE CULTURE     Status: None   Collection Time    09/14/13 12:25 PM      Result Value Ref Range Status   Specimen Description URINE, CLEAN CATCH   Final   Special Requests NONE   Final   Culture  Setup Time     Final   Value: 09/14/2013 13:15     Performed at [facility]     Final   Value: NO GROWTH     Performed at [facility]   Culture     Final   Value: NO GROWTH     Performed at [facility]   Report Status 09/15/2013 FINAL   Final    Radiology Reports Mri Brain Without Contrast  09/14/2013   CLINICAL DATA:  Left-sided visual loss.  EXAM: MRI HEAD WITHOUT CONTRAST  TECHNIQUE: Multiplanar, multiecho pulse sequences of the brain and surrounding structures were obtained without intravenous contrast.  COMPARISON:  CT 03/14/2013  FINDINGS:  Diffusion imaging does not show any acute or subacute infarction. The brain in general shows mild atrophy. There is no focal insult seen affecting the brainstem or cerebellum. The cerebral hemispheres show foci of T2 and FLAIR signal within the deep white matter consistent with chronic small vessel disease. This is most pronounced in the white matter adjacent to the atrium of the right lateral ventricle, but this should be  above the optic radiations. There is no sign of mass lesion, hemorrhage, hydrocephalus or extra-axial collection. No pituitary mass. As visualized on this standard brain MRI, the orbital contents appear normal. There is mild mucosal inflammation of the paranasal sinuses, possibly subclinical.  IMPRESSION: No acute finding. No specific lesion to explain left-sided visual loss. Ordinary chronic appearing small vessel change of the cerebral hemispheric white matter.   Electronically Signed   By: Nelson Chimes M.D.   On: 09/14/2013 07:38    CBC  Recent Labs Lab 09/13/13 1548 09/13/13 1607  WBC 6.0  --   HGB 11.5* 12.6*  HCT 33.8* 37.0*  PLT 227  --   MCV 93.1  --   MCH 31.7  --   MCHC 34.0  --   RDW 17.1*  --   LYMPHSABS 1.9  --   MONOABS 0.3  --   EOSABS 0.3  --   BASOSABS 0.0  --     Chemistries   Recent Labs Lab 09/13/13 1548 09/13/13 1607 09/15/13 0421  NA 138 138 138  K 3.9 3.7 3.7  CL 98 100 98  CO2 26  --  26  GLUCOSE 110* 109* 116*  BUN 16 16 15   CREATININE 1.40* 1.50* 0.92  CALCIUM 9.6  --  9.1  AST 29  --   --   ALT 25  --   --   ALKPHOS 91  --   --   BILITOT 0.5  --   --    ------------------------------------------------------------------------------------------------------------------ estimated creatinine clearance is 98.6 ml/min (by C-G formula based on Cr of 0.92). ------------------------------------------------------------------------------------------------------------------  Recent Labs  09/13/13 2038  HGBA1C 6.1*    ------------------------------------------------------------------------------------------------------------------  Recent Labs  09/14/13 0510  CHOL 218*  HDL 56  LDLCALC UNABLE TO CALCULATE IF TRIGLYCERIDE OVER 400 mg/dL  TRIG 544*  CHOLHDL 3.9   ------------------------------------------------------------------------------------------------------------------ No results found for this basename: TSH, T4TOTAL, FREET3, T3FREE, THYROIDAB,  in the last 72 hours ------------------------------------------------------------------------------------------------------------------ No results found for this basename: VITAMINB12, FOLATE, FERRITIN, TIBC, IRON, RETICCTPCT,  in the last 72 hours  Coagulation profile  Recent Labs Lab 09/13/13 1548  INR 0.96    No results found for this basename: DDIMER,  in the last 72 hours  Cardiac Enzymes No results found for this basename: CK, CKMB, TROPONINI, MYOGLOBIN,  in the last 168 hours ------------------------------------------------------------------------------------------------------------------ No components found with this basename: POCBNP,      Time Spent in minutes  35   SINGH,PRASHANT K M.D on 09/15/2013 at 10:37 AM  Between 7am to 7pm - Pager - [phone]  After 7pm go to [email or web address] - password [email or web address]  And look for the night coverage person covering for me after hours  [facility] Office  [phone]   **Disclaimer: This note may have been dictated with voice recognition software. Similar sounding words can inadvertently be transcribed and this note may contain transcription errors which may not have been corrected upon publication of note.**

## 2013-09-15 NOTE — Progress Notes (Signed)
  Echocardiogram 2D Echocardiogram has been performed.  Travis [name] 09/15/2013, 10:30 AM

## 2013-09-15 NOTE — Progress Notes (Signed)
Physical medicine and rehabilitation consult requested with chart reviewed. Occupational therapy evaluation physical therapy evaluation completed with no needs identified at this time and recommendations are for discharge to home with outpatient therapies. Patient will not need inpatient rehabilitation services at this time. Hold on formal rehabilitation consult

## 2013-09-15 NOTE — Progress Notes (Signed)
Talked to patient about Outpatient rehab choices, pt requested [facility]; Orders and clinical information faxed to rehab; The rehab center will contact the patient for start up date and time; Aneta Mins [phone]

## 2013-09-15 NOTE — Progress Notes (Signed)
Subjective: Patient has no complaints.  Still having difficulty with the medial vision when looking with left eye only.    Objective: Current vital signs: BP 157/54  Pulse 92  Temp(Src) 98.6 F (37 C) (Oral)  Resp 18  Ht 6\' 4"  (1.93 m)  Wt 78.699 kg (173 lb 8 oz)  BMI 21.13 kg/m2  SpO2 96% Vital signs in last 24 hours: Temp:  [98 F (36.7 C)-98.7 F (37.1 C)] 98.6 F (37 C) (06/23 1407) Pulse Rate:  [80-98] 92 (06/23 1407) Resp:  [18] 18 (06/23 1407) BP: (127-157)/(54-88) 157/54 mmHg (06/23 1407) SpO2:  [94 %-99 %] 96 % (06/23 1407)  Intake/Output from previous day: 06/22 0701 - 06/23 0700 In: 440 [P.O.:440] Out: -  Intake/Output this shift: Total I/O In: 480 [P.O.:480] Out: -  Nutritional status: Carb Control  Neurologic Exam: Mental Status:  Alert, oriented, thought content appropriate. Speech fluent without evidence of aphasia. Able to follow 3 step commands without difficulty.  Cranial Nerves:  II:  pupils 2-3 mm bilaterally with left APD. Right eye has full vision. Left eye has no medial vision but preserved lateral vision.   III,IV, VI: ptosis not present, extra-ocular motions intact bilaterally  V,VII: smile symmetric, facial light touch sensation normal bilaterally  VIII: hearing normal bilaterally  IX,X: gag reflex present  XI: bilateral shoulder shrug  XII: midline tongue extension without atrophy or fasciculations  Motor:  Right : Upper extremity 5/5  Left:  Upper extremity 5/5   Lower extremity 5/5   Lower extremity 5/5  Tone and bulk:normal tone throughout; no atrophy noted  Sensory: Pinprick and light touch intact throughout, bilaterally  Deep Tendon Reflexes:  2+ throughout Plantars:  Right: downgoing Left: downgoing  Cerebellar:  normal finger-to-nose, normal heel-to-shin test.  Gait:  No tested      Lab Results: Basic Metabolic Panel:  Recent Labs Lab 09/13/13 1548 09/13/13 1607 09/15/13 0421  NA 138 138 138  K 3.9 3.7 3.7  CL  98 100 98  CO2 26  --  26  GLUCOSE 110* 109* 116*  BUN 16 16 15   CREATININE 1.40* 1.50* 0.92  CALCIUM 9.6  --  9.1    Liver Function Tests:  Recent Labs Lab 09/13/13 1548  AST 29  ALT 25  ALKPHOS 91  BILITOT 0.5  PROT 6.8  ALBUMIN 3.5   No results found for this basename: LIPASE, AMYLASE,  in the last 168 hours No results found for this basename: AMMONIA,  in the last 168 hours  CBC:  Recent Labs Lab 09/13/13 1548 09/13/13 1607  WBC 6.0  --   NEUTROABS 3.6  --   HGB 11.5* 12.6*  HCT 33.8* 37.0*  MCV 93.1  --   PLT 227  --     Cardiac Enzymes: No results found for this basename: CKTOTAL, CKMB, CKMBINDEX, TROPONINI,  in the last 168 hours  Lipid Panel:  Recent Labs Lab 09/14/13 0510  CHOL 218*  TRIG 544*  HDL 56  CHOLHDL 3.9  VLDL UNABLE TO CALCULATE IF TRIGLYCERIDE OVER 400 mg/dL  LDLCALC UNABLE TO CALCULATE IF TRIGLYCERIDE OVER 400 mg/dL    CBG:  Recent Labs Lab 09/14/13 1938 09/14/13 2140 09/15/13 0650 09/15/13 1155 09/15/13 1633  GLUCAP 72 133* 119* 155* 128*    Microbiology: Results for orders placed during the hospital encounter of 09/13/13  URINE CULTURE     Status: None   Collection Time    09/14/13 12:25 PM      Result  Value Ref Range Status   Specimen Description URINE, CLEAN CATCH   Final   Special Requests NONE   Final   Culture  Setup Time     Final   Value: 09/14/2013 13:15     Performed at [place]     Final   Value: NO GROWTH     Performed at [facility]   Culture     Final   Value: NO GROWTH     Performed at [facility]   Report Status 09/15/2013 FINAL   Final    Coagulation Studies:  Recent Labs  09/13/13 1548  LABPROT 12.6  INR 0.96    Imaging: Mri Brain Without Contrast  09/14/2013   CLINICAL DATA:  Left-sided visual loss.  EXAM: MRI HEAD WITHOUT CONTRAST  TECHNIQUE: Multiplanar, multiecho pulse sequences of the brain and surrounding structures were obtained  without intravenous contrast.  COMPARISON:  CT 03/14/2013  FINDINGS: Diffusion imaging does not show any acute or subacute infarction. The brain in general shows mild atrophy. There is no focal insult seen affecting the brainstem or cerebellum. The cerebral hemispheres show foci of T2 and FLAIR signal within the deep white matter consistent with chronic small vessel disease. This is most pronounced in the white matter adjacent to the atrium of the right lateral ventricle, but this should be above the optic radiations. There is no sign of mass lesion, hemorrhage, hydrocephalus or extra-axial collection. No pituitary mass. As visualized on this standard brain MRI, the orbital contents appear normal. There is mild mucosal inflammation of the paranasal sinuses, possibly subclinical.  IMPRESSION: No acute finding. No specific lesion to explain left-sided visual loss. Ordinary chronic appearing small vessel change of the cerebral hemispheric white matter.   Electronically Signed   By: Nelson Chimes M.D.   On: 09/14/2013 07:38    VASCULAR LAB  PRELIMINARY PRELIMINARY PRELIMINARY PRELIMINARY  Carotid duplex completed.  Preliminary report: Bilateral: 1-39% ICA stenosis. Vertebral artery flow is antegrade.  2 D echo Study Conclusions  - Impressions: Limited study No doppler or M mode Moderate LVE. No discrete RWMA;s Diffuse hypokinesis worse in septum and inferior walls EF 45-50%  Impressions:  - Limited study No doppler or M mode Moderate LVE. No discrete RWMA;s Diffuse hypokinesis worse in septum and inferior walls EF 45-50%    Medications:  Scheduled: . aspirin  325 mg Oral Daily  . atorvastatin  10 mg Oral q1800  . enoxaparin (LOVENOX) injection  40 mg Subcutaneous Q24H  . fenofibrate  54 mg Oral Daily  . folic acid  1 mg Oral Daily  . insulin aspart  0-9 Units Subcutaneous TID WC  . multivitamin with minerals  1 tablet Oral Daily  . nicotine  21 mg Transdermal Q24H  . thiamine  100 mg  Oral Daily    Assessment/Plan:  57 with multiple risk factors for stroke admitted with left CRAO. CUS normal, Echo shows diffuse hypokinesis with EF 45-50%. Awaiting few hypercoagulable panel results which can be followed out patient.   Recommend: 1) Follow up with neurology 3-4 weeks 2) Continue folic acid and ASA 3) Follow up with opthalmology as out patietn    Neurology S/O   Etta Quill PA-C [facility] [phone]  09/15/2013, 4:55 PM

## 2013-09-16 DIAGNOSIS — H3410 Central retinal artery occlusion, unspecified eye: Secondary | ICD-10-CM | POA: Diagnosis not present

## 2013-09-16 LAB — GLUCOSE, CAPILLARY: Glucose-Capillary: 119 mg/dL — ABNORMAL HIGH (ref 70–99)

## 2013-09-16 MED ORDER — THIAMINE HCL 100 MG PO TABS: 100.0000 mg | ORAL_TABLET | Freq: Every day | ORAL | Status: DC

## 2013-09-16 MED ORDER — ATORVASTATIN CALCIUM 10 MG PO TABS: 10.0000 mg | ORAL_TABLET | Freq: Every day | ORAL | Status: DC

## 2013-09-16 MED ORDER — FOLIC ACID 1 MG PO TABS: 1.0000 mg | ORAL_TABLET | Freq: Every day | ORAL | Status: DC

## 2013-09-16 MED ORDER — FENOFIBRATE 54 MG PO TABS: 54.0000 mg | ORAL_TABLET | Freq: Every day | ORAL | Status: DC

## 2013-09-16 MED ORDER — NICOTINE 21 MG/24HR TD PT24: 21.0000 mg | MEDICATED_PATCH | TRANSDERMAL | Status: DC

## 2013-09-16 MED ORDER — CARVEDILOL 3.125 MG PO TABS: 3.1250 mg | ORAL_TABLET | Freq: Two times a day (BID) | ORAL | Status: DC

## 2013-09-16 MED ORDER — LISINOPRIL 2.5 MG PO TABS: 2.5000 mg | ORAL_TABLET | Freq: Every day | ORAL | Status: DC

## 2013-09-16 NOTE — Progress Notes (Signed)
Patient is discharged from room 4N19 at this time. Alert and in stable condition. IV site as well as tele d/c'd. Instructions read to patient and understanding verbalized. Left unit via wheelchair with belongings at side.

## 2013-09-16 NOTE — Discharge Summary (Signed)
Travis [name], is a 58 y.o. male  DOB 07-07-1955  MRN [id number].  Admission date:  09/13/2013  Admitting Physician  Ripudeep Krystal Eaton, MD  Discharge Date:  09/16/2013   Primary MD  [name], NP  Recommendations for primary care physician for things to follow:    Follow BMP weight closely. Results of factor V Leiden and prothrombin gene mutation which are pending.   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 Systolic heart failure     EF 20-25% by echo 12/28/10  . Nonischemic cardiomyopathy     minimal coronary disease by cath 12/29/10  . Hemoptysis     secondary to pulmonary edema  . Hyperlipidemia   . Chronic anemia   . GI bleed     15 years ago  . Tobacco abuse   . Alcohol abuse   . Osteoarthritis   . Gout   . Diabetes mellitus, type 2     pt reports his DM is gone  . Alcoholic cardiomyopathy [id number]    Past Surgical History  Procedure Laterality Date  . Cardiac catheterization    . Hip arthroplasty Right 03/15/2013    Procedure: ARTHROPLASTY BIPOLAR HIP;  Surgeon: [name], MD;  Location: [facility];  Service: Orthopedics;  Laterality:  Right;     Discharge Condition: stable   Follow UP  Follow-up Information   Follow up with [facility] (tele # [phone]; they will contact you for start up date and time;)    Contact information:   [place] [address]       Follow up with [name], NP. Schedule an appointment as soon as possible for a visit in 1 week.   Specialty:  Internal Medicine   Contact information:   [address] [place] [address] [phone]       Follow up with [facility]. Schedule an appointment as soon as possible for a visit in 1 week.   Contact information:   [address] [place] [address] [phone]      Follow up with SPENCER,MICHAEL A, MD. Schedule an appointment as soon as possible for a visit in 1 week.   Specialty:  Ophthalmology   Contact information:   [place] [address] [phone]       Follow up with Dola Argyle, MD. Schedule an appointment  as soon as possible for a visit in 1 week.   Specialty:  Cardiology   Contact information:   [id number] [place] [address] [phone]         Discharge Instructions  and  Discharge Medications      Discharge Instructions   Diet - low sodium heart healthy    Complete by:  As directed      Discharge instructions    Complete by:  As directed   Follow with Primary MD [name], NP in 7 days, wear the patch given to you on her left eye.   Follow results of hypercoagulable panel which are pending for factor V Leiden and prothrombin gene mutation.  Get CBC, CMP  checked  by Primary MD next visit.    Activity: As tolerated with Full fall precautions use walker/cane & assistance as needed   Disposition Home     Diet: Heart Healthy - Check your Weight same time everyday, if you gain over 2 pounds, or you develop in leg swelling, experience more shortness of breath or chest pain, call your  Primary MD immediately. Follow Cardiac Low [name] Diet and 1.8 lit/day fluid restriction.   On your next visit with her primary care physician please Get Medicines reviewed and adjusted.  Please request your Prim.MD to go over all Hospital Tests and Procedure/Radiological results at the follow up, please get all Hospital records sent to your Prim MD by signing hospital release before you go home.   If you experience worsening of your admission symptoms, develop shortness of breath, life threatening emergency, suicidal or homicidal thoughts you must seek medical attention immediately by calling 911 or calling your MD immediately  if symptoms less severe.  You Must read complete instructions/literature along with all the possible adverse reactions/side effects for all the Medicines you take and that have been prescribed to you. Take any new Medicines after you have completely understood and accpet all the possible adverse reactions/side effects.   Do not drive and provide baby sitting services if your were admitted for syncope or siezures until you have seen by Primary MD or a Neurologist and advised to do so again.  Do not drive when taking Pain medications.    Do not take more than prescribed Pain, Sleep and Anxiety Medications  Special Instructions: If you have smoked or chewed Tobacco  in the last 2 yrs please stop smoking, stop any regular Alcohol  and or any Recreational drug use.  Wear Seat belts while driving.   Please note  You were cared for by a hospitalist during your hospital stay. If you have any questions about your discharge medications or the care you received while you were in the hospital after you are discharged, you can call the unit and asked to speak with the hospitalist on call if the hospitalist that took care of you is not available. Once you are discharged, your primary care physician will handle any further medical issues. Please note that NO REFILLS for any  discharge medications will be authorized once you are discharged, as it is imperative that you return to your primary care physician (or establish a relationship with a primary care physician if you do not have one) for your aftercare needs so that they can reassess your need for medications and monitor your lab values.  Follow with Primary MD [name], NP in 7 days, wear the patch given to you on her left eye.   Follow  results of hypercoagulable panel which are pending for factor V Leiden and prothrombin gene mutation.  Get CBC, CMP  checked  by Primary MD next visit.    Activity: As tolerated with Full fall precautions use walker/cane & assistance as needed   Disposition Home     Diet: Heart Healthy - Check your Weight same time everyday, if you gain over 2 pounds, or you develop in leg swelling, experience more shortness of breath or chest pain, call your Primary MD immediately. Follow Cardiac Low [name] Diet and 1.8 lit/day fluid restriction.   On your next visit with her primary care physician please Get Medicines reviewed and adjusted.  Please request your Prim.MD to go over all Hospital Tests and Procedure/Radiological results at the follow up, please get all Hospital records sent to your Prim MD by signing hospital release before you go home.   If you experience worsening of your admission symptoms, develop shortness of breath, life threatening emergency, suicidal or homicidal thoughts you must seek medical attention immediately by calling 911 or calling your MD immediately  if symptoms less severe.  You Must read complete instructions/literature along with all the possible adverse reactions/side effects for all the Medicines you take and that have been prescribed to you. Take any new Medicines after you have completely understood and accpet all the possible adverse reactions/side effects.   Do not drive and provide baby sitting services if your were admitted for syncope or  siezures until you have seen by Primary MD or a Neurologist and advised to do so again.  Do not drive when taking Pain medications.    Do not take more than prescribed Pain, Sleep and Anxiety Medications  Special Instructions: If you have smoked or chewed Tobacco  in the last 2 yrs please stop smoking, stop any regular Alcohol  and or any Recreational drug use.  Wear Seat belts while driving.   Please note  You were cared for by a hospitalist during your hospital stay. If you have any questions about your discharge medications or the care you received while you were in the hospital after you are discharged, you can call the unit and asked to speak with the hospitalist on call if the hospitalist that took care of you is not available. Once you are discharged, your primary care physician will handle any further medical issues. Please note that NO REFILLS for any discharge medications will be authorized once you are discharged, as it is imperative that you return to your primary care physician (or establish a relationship with a primary care physician if you do not have one) for your aftercare needs so that they can reassess your need for medications and monitor your lab values.     Increase activity slowly    Complete by:  As directed             Medication List    STOP taking these medications       lisinopril-hydrochlorothiazide 10-12.5 MG per tablet  Commonly known as:  PRINZIDE,ZESTORETIC      TAKE these medications       aspirin 325 MG EC tablet  Take 325 mg by mouth daily.     atorvastatin 10 MG tablet  Commonly known as:  LIPITOR  Take 1 tablet (10 mg total) by mouth daily at 6 PM.     carvedilol 3.125 MG tablet  Commonly known as:  COREG  Take 1 tablet (3.125 mg total) by mouth 2 (two) times daily with  a meal.     colchicine 0.6 MG tablet  Take 1 tablet (0.6 mg total) by mouth 2 (two) times daily as needed.     diclofenac 50 MG EC tablet  Commonly known as:   VOLTAREN  Take 50 mg by mouth daily as needed for mild pain.     fenofibrate 54 MG tablet  Take 1 tablet (54 mg total) by mouth daily.     folic acid 1 MG tablet  Commonly known as:  FOLVITE  Take 1 tablet (1 mg total) by mouth daily.     lisinopril 2.5 MG tablet  Commonly known as:  ZESTRIL  Take 1 tablet (2.5 mg total) by mouth daily.  Start taking on:  09/18/2013     nicotine 21 mg/24hr patch  Commonly known as:  NICODERM CQ - dosed in mg/24 hours  Place 1 patch (21 mg total) onto the skin daily.     ONE-A-DAY MENS PO  Take 1 tablet by mouth.     tetrahydrozoline 0.05 % ophthalmic solution  Place 1 drop into both eyes daily as needed (Eye irritation).     thiamine 100 MG tablet  Take 1 tablet (100 mg total) by mouth daily.          Diet and Activity recommendation: See Discharge Instructions above   Consults obtained - Neuro,Opthalmology   Major procedures and Radiology Reports - PLEASE review detailed and final reports for all details, in brief -      Mri Brain Without Contrast  09/14/2013   CLINICAL DATA:  Left-sided visual loss.  EXAM: MRI HEAD WITHOUT CONTRAST  TECHNIQUE: Multiplanar, multiecho pulse sequences of the brain and surrounding structures were obtained without intravenous contrast.  COMPARISON:  CT 03/14/2013  FINDINGS: Diffusion imaging does not show any acute or subacute infarction. The brain in general shows mild atrophy. There is no focal insult seen affecting the brainstem or cerebellum. The cerebral hemispheres show foci of T2 and FLAIR signal within the deep white matter consistent with chronic small vessel disease. This is most pronounced in the white matter adjacent to the atrium of the right lateral ventricle, but this should be above the optic radiations. There is no sign of mass lesion, hemorrhage, hydrocephalus or extra-axial collection. No pituitary mass. As visualized on this standard brain MRI, the orbital contents appear normal. There is  mild mucosal inflammation of the paranasal sinuses, possibly subclinical.  IMPRESSION: No acute finding. No specific lesion to explain left-sided visual loss. Ordinary chronic appearing small vessel change of the cerebral hemispheric white matter.   Electronically Signed   By: Nelson Chimes M.D.   On: 09/14/2013 07:38    Micro Results     Recent Results (from the past 240 hour(s))  URINE CULTURE     Status: None   Collection Time    09/14/13 12:25 PM      Result Value Ref Range Status   Specimen Description URINE, CLEAN CATCH   Final   Special Requests NONE   Final   Culture  Setup Time     Final   Value: 09/14/2013 13:15     Performed at [facility]     Final   Value: NO GROWTH     Performed at [facility]   Culture     Final   Value: NO GROWTH     Performed at [facility]   Report Status 09/15/2013 FINAL   Final  History of present illness and  Hospital Course:     Kindly see H&P for history of present illness and admission details, please review complete Labs, Consult reports and Test reports for all details in brief Travis [name], is a 58 y.o. male, patient with history of hypertension, hyperlipidemia, diabetes, (diet controlled), gout, tobacco abuse, alcohol abuse presented to the ER with vision loss in the left eye. History was obtained from the patient who reported that he had noticed some spots in his left eye yesterday evening. This morning when he woke up, he noticed that the spot Was enlarging and described as "smudge getting bigger". He went to the church and throughout the day, it progressively became worse to the point that he could not see anything from his left eye.  Patient denied any pain in the eyes, drainage or any redness or any injury. He otherwise denied any focal weakness, dysarthria, any numbness or tingling. He denies any prior history of stroke or TIA.       Visual loss, left eye likely due to CRA (central  retinal artery occlusion)   - Seen by ophthalmology and neurology. Full stroke workup donel include an MRI MRA brain, carotid duplex and echo gram. He was on aspirin full dose prior to admission which is being continued by Neuro. Lipid panel noted and placed on TriCor along with statin, we'll get outpatient OT for continued left vision loss, I patch given for left eye, okay for discharge per neurology on aspirin along with statin and TriCor, will follow with neuro and ophthalmology outpatient post discharge.    Note hypercoagulable panel full testing is pending. D/W Dr Benay Spice with hematology as well including lupus anticoagulant which was negative besides DRVVT slightly high 9 in significant per hematologist).   Slightly high at homocystine unremarkable per hematologist. Stable   B12 levels.      Please follow the pending Factor 5 and Prothrombin gene pending.       Echo    TTE  - Left ventricle: DIfficult apical windows. LVEF not well visualized in apical windows. LV systolic function appears to be moderately depressed.l Would recomm repeat evaluation with contrast to further define wall motion. The cavity size was mildly dilated. Wall thickness was normal.     Repeat TTE  - Limited study No doppler or M mode Moderate LVE. No discrete RWMA;s Diffuse hypokinesis worse in septum and inferior walls EF 45-50%     Carotids  Preliminary report: Bilateral: 1-39% ICA stenosis. Vertebral artery flow is antegrade.     ? Type 2DM   - Likely prediabetic, Diet controlled, to be monitored by PCP outpatient.   Lab Results   Component  Value  Date    HGBA1C  6.1*  09/13/2013       HYPERLIPIDEMIA noted, will added tricor along with low dose statin   Lab Results   Component  Value  Date    CHOL  218*  09/14/2013    HDL  56  09/14/2013    LDLCALC  UNABLE TO CALCULATE IF TRIGLYCERIDE OVER 400 mg/dL  09/14/2013    TRIG  544*  09/14/2013    CHOLHDL  3.9  09/14/2013         Chronic systolic heart failure:   - Currently stable, noted echogram, placed on Coreg, fluid [name] restriction and low-dose ACE inhibitor. Requested to follow with cardiology outpatient.      Smoker  - Placed on nicotine patch, strongly counseled for smoking cessation  Alcohol abuse  - Patient reports drinking on the weekends, no signs of DTs, thiamine, folic acid , counseled against heavy drinking     Acute renal insufficiency  - Baseline creatinine 1.0, presenting with creatinine of 1.5, resolved with IV fluids, discontinue HCTZ reduced ACE inhibitor dose      Elevated homocystine level.  stable B12     Today   Subjective:   Travis [name] today has no headache,no chest abdominal pain,no new weakness tingling or numbness, feels much better wants to go home today.    Objective:   Blood pressure 138/83, pulse 86, temperature 97.8 F (36.6 C), temperature source Oral, resp. rate 16, height 6\' 4"  (1.93 m), weight 78.699 kg (173 lb 8 oz), SpO2 96.00%.   Intake/Output Summary (Last 24 hours) at 09/16/13 0847 Last data filed at 09/15/13 1734  Gross per 24 hour  Intake    720 ml  Output      0 ml  Net    720 ml    Exam Awake Alert, Oriented x 3, No new F.N deficits, Normal affect, still has extremely poor vision in the left eye  [place].AT,PERRAL Supple Neck,No JVD, No cervical lymphadenopathy appriciated.  Symmetrical Chest wall movement, Good air movement bilaterally, CTAB RRR,No Gallops,Rubs or new Murmurs, No Parasternal Heave +ve B.Sounds, Abd Soft, Non tender, No organomegaly appriciated, No rebound -guarding or rigidity. No Cyanosis, Clubbing or edema, No new Rash or bruise  Data Review   CBC w Diff: Lab Results  Component Value Date   WBC 6.0 09/13/2013   HGB 12.6* 09/13/2013   HCT 37.0* 09/13/2013   PLT 227 09/13/2013   LYMPHOPCT 31 09/13/2013   MONOPCT 5 09/13/2013   EOSPCT 5 09/13/2013   BASOPCT 0 09/13/2013    CMP: Lab Results  Component  Value Date   NA 138 09/15/2013   NA 145* 02/25/2012   K 3.7 09/15/2013   CL 98 09/15/2013   CO2 26 09/15/2013   BUN 15 09/15/2013   BUN 11 02/25/2012   CREATININE 0.92 09/15/2013   PROT 6.8 09/13/2013   ALBUMIN 3.5 09/13/2013   BILITOT 0.5 09/13/2013   ALKPHOS 91 09/13/2013   AST 29 09/13/2013   ALT 25 09/13/2013  .   Total Time in preparing paper work, data evaluation and todays exam - 35 minutes  Thurnell Lose M.D on 09/16/2013 at 8:47 AM  [facility] Office  [phone]   **Disclaimer: This note may have been dictated with voice recognition software. Similar sounding words can inadvertently be transcribed and this note may contain transcription errors which may not have been corrected upon publication of note.**

## 2013-09-16 NOTE — Discharge Instructions (Signed)
Follow with Primary MD [name], NP in 7 days, wear the patch given to you on her left eye.   Follow results of hypercoagulable panel which are pending for factor V Leiden and prothrombin gene mutation.  Get CBC, CMP  checked  by Primary MD next visit.    Activity: As tolerated with Full fall precautions use walker/cane & assistance as needed   Disposition Home     Diet: Heart Healthy - Check your Weight same time everyday, if you gain over 2 pounds, or you develop in leg swelling, experience more shortness of breath or chest pain, call your Primary MD immediately. Follow Cardiac Low Salt Diet and 1.8 lit/day fluid restriction.   On your next visit with her primary care physician please Get Medicines reviewed and adjusted.  Please request your Prim.MD to go over all Hospital Tests and Procedure/Radiological results at the follow up, please get all Hospital records sent to your Prim MD by signing hospital release before you go home.   If you experience worsening of your admission symptoms, develop shortness of breath, life threatening emergency, suicidal or homicidal thoughts you must seek medical attention immediately by calling 911 or calling your MD immediately  if symptoms less severe.  You Must read complete instructions/literature along with all the possible adverse reactions/side effects for all the Medicines you take and that have been prescribed to you. Take any new Medicines after you have completely understood and accpet all the possible adverse reactions/side effects.   Do not drive and provide baby sitting services if your were admitted for syncope or siezures until you have seen by Primary MD or a Neurologist and advised to do so again.  Do not drive when taking Pain medications.    Do not take more than prescribed Pain, Sleep and Anxiety Medications  Special Instructions: If you have smoked or chewed Tobacco  in the last 2 yrs please stop smoking, stop any regular  Alcohol  and or any Recreational drug use.  Wear Seat belts while driving.   Please note  You were cared for by a hospitalist during your hospital stay. If you have any questions about your discharge medications or the care you received while you were in the hospital after you are discharged, you can call the unit and asked to speak with the hospitalist on call if the hospitalist that took care of you is not available. Once you are discharged, your primary care physician will handle any further medical issues. Please note that NO REFILLS for any discharge medications will be authorized once you are discharged, as it is imperative that you return to your primary care physician (or establish a relationship with a primary care physician if you do not have one) for your aftercare needs so that they can reassess your need for medications and monitor your lab values.

## 2013-09-17 LAB — PROTHROMBIN GENE MUTATION

## 2013-09-17 LAB — FACTOR 5 LEIDEN

## 2013-09-28 ENCOUNTER — Telehealth: Payer: Self-pay | Admitting: Internal Medicine

## 2013-09-28 ENCOUNTER — Encounter: Payer: Self-pay | Admitting: Internal Medicine

## 2013-09-28 ENCOUNTER — Ambulatory Visit (INDEPENDENT_AMBULATORY_CARE_PROVIDER_SITE_OTHER): Payer: Medicare Other | Admitting: Internal Medicine

## 2013-09-28 VITALS — BP 120/70 | HR 101 | Temp 97.7°F | Wt 180.0 lb

## 2013-09-28 DIAGNOSIS — N289 Disorder of kidney and ureter, unspecified: Secondary | ICD-10-CM | POA: Diagnosis not present

## 2013-09-28 DIAGNOSIS — L259 Unspecified contact dermatitis, unspecified cause: Secondary | ICD-10-CM

## 2013-09-28 DIAGNOSIS — I5022 Chronic systolic (congestive) heart failure: Secondary | ICD-10-CM | POA: Diagnosis not present

## 2013-09-28 DIAGNOSIS — H3410 Central retinal artery occlusion, unspecified eye: Secondary | ICD-10-CM

## 2013-09-28 DIAGNOSIS — H3412 Central retinal artery occlusion, left eye: Secondary | ICD-10-CM

## 2013-09-28 LAB — RENAL FUNCTION PANEL
ALBUMIN: 4.1 g/dL (ref 3.5–5.2)
BUN: 15 mg/dL (ref 6–23)
CHLORIDE: 100 meq/L (ref 96–112)
CO2: 30 mEq/L (ref 19–32)
Calcium: 10.4 mg/dL (ref 8.4–10.5)
Creatinine, Ser: 1.2 mg/dL (ref 0.4–1.5)
GFR: 78.54 mL/min (ref >60.00)
Glucose, Bld: 126 mg/dL — ABNORMAL HIGH (ref 70–99)
POTASSIUM: 4.4 meq/L (ref 3.5–5.1)
Phosphorus: 3.5 mg/dL (ref 2.3–4.6)
Sodium: 139 mEq/L (ref 135–145)

## 2013-09-28 LAB — CBC WITH DIFFERENTIAL/PLATELET
BASOS PCT: 0.8 % (ref 0.0–3.0)
Basophils Absolute: 0.1 10*3/uL (ref 0.0–0.1)
Eosinophils Absolute: 1 10*3/uL — ABNORMAL HIGH (ref 0.0–0.7)
Eosinophils Relative: 14.9 % — ABNORMAL HIGH (ref 0.0–5.0)
HCT: 38.2 % — ABNORMAL LOW (ref 39.0–52.0)
HEMOGLOBIN: 12.5 g/dL — AB (ref 13.0–17.0)
LYMPHS PCT: 41.1 % (ref 12.0–46.0)
Lymphs Abs: 2.7 10*3/uL (ref 0.7–4.0)
MCHC: 32.7 g/dL (ref 30.0–36.0)
MCV: 96.2 fl (ref 78.0–100.0)
Monocytes Absolute: 0.4 10*3/uL (ref 0.1–1.0)
Monocytes Relative: 6 % (ref 3.0–12.0)
NEUTROS ABS: 2.5 10*3/uL (ref 1.4–7.7)
Neutrophils Relative %: 37.2 % — ABNORMAL LOW (ref 43.0–77.0)
Platelets: 303 10*3/uL (ref 150.0–400.0)
RBC: 3.98 Mil/uL — AB (ref 4.22–5.81)
RDW: 18.6 % — ABNORMAL HIGH (ref 11.5–15.5)
WBC: 6.6 10*3/uL (ref 4.0–10.5)

## 2013-09-28 MED ORDER — TRIAMCINOLONE ACETONIDE 0.1 % EX CREA: 1.0000 "application " | TOPICAL_CREAM | Freq: Two times a day (BID) | CUTANEOUS | Status: DC | PRN

## 2013-09-28 MED ORDER — COLCHICINE 0.6 MG PO TABS: 0.6000 mg | ORAL_TABLET | Freq: Two times a day (BID) | ORAL | Status: DC | PRN

## 2013-09-28 NOTE — Telephone Encounter (Signed)
Patient Information:  Caller Name: Ibraheem  Phone: [phone]  Patient: Travis [name], Travis [name]  Gender: Male  DOB: [birth year]  Age: 58 Years  PCP: [name]  Office Follow Up:  Does the office need to follow up with this patient?: No  Instructions For The Office: N/A   Symptoms  Reason For Call & Symptoms: Started on several new medications recently. Now has rash to both arms that is itching.  Reviewed Health History In EMR: Yes  Reviewed Medications In EMR: Yes  Reviewed Allergies In EMR: Yes  Reviewed Surgeries / Procedures: Yes  Date of Onset of Symptoms: 09/24/2013  Treatments Tried: Benadryl  Treatments Tried Worked: Yes  Guideline(s) Used:  No Protocol Available - Sick Adult  Disposition Per Guideline:   See Today in Office  Reason For Disposition Reached:   Nursing judgment  Advice Given:  N/A  Patient Will Follow Care Advice:  YES  Appointment Scheduled:  09/28/2013 14:45:00 Appointment Scheduled Provider:  Viviana Simpler [facility])

## 2013-09-28 NOTE — Assessment & Plan Note (Signed)
Will recheck labs today .

## 2013-09-28 NOTE — Assessment & Plan Note (Signed)
Has ophtho follow up planned Now on asa, statin, etc

## 2013-09-28 NOTE — Assessment & Plan Note (Signed)
Arm rash does not look like a medication reaction Will treat with TAC Follow up next week

## 2013-09-28 NOTE — Assessment & Plan Note (Signed)
Compensated now May have effusions I asked him to monitor his weight daily

## 2013-09-28 NOTE — Progress Notes (Signed)
Pre visit review using our clinic review tool, if applicable. No additional management support is needed unless otherwise documented below in the visit note. 

## 2013-09-28 NOTE — Progress Notes (Signed)
Subjective:    Patient ID: Travis [name], male    DOB: 1955-08-14, 58 y.o.   MRN: [id number]  HPI Having fine rash on forearms Little bumps with itching Wonders if it is from pills in hospital Started 5 days ago-- was 1-2 weeks out from discharge Tried benedryl--no clear help  Still smoking--not ready to quit. Is slowing down Did stop the alcohol  No chest pain No SOB No leg swelling No dizziness or syncope  Current Outpatient Prescriptions on File Prior to Visit  Medication Sig Dispense Refill  . aspirin 325 MG EC tablet Take 325 mg by mouth daily.      [name] atorvastatin (LIPITOR) 10 MG tablet Take 1 tablet (10 mg total) by mouth daily at 6 PM.  30 tablet  0  . carvedilol (COREG) 3.125 MG tablet Take 1 tablet (3.125 mg total) by mouth 2 (two) times daily with a meal.  60 tablet  0  . colchicine 0.6 MG tablet Take 1 tablet (0.6 mg total) by mouth 2 (two) times daily as needed.  60 tablet  2  . diclofenac (VOLTAREN) 50 MG EC tablet Take 50 mg by mouth daily as needed for mild pain.       . fenofibrate 54 MG tablet Take 1 tablet (54 mg total) by mouth daily.  30 tablet  0  . folic acid (FOLVITE) 1 MG tablet Take 1 tablet (1 mg total) by mouth daily.  30 tablet  0  . lisinopril (ZESTRIL) 2.5 MG tablet Take 1 tablet (2.5 mg total) by mouth daily.  30 tablet  0  . Multiple Vitamin (ONE-A-DAY MENS PO) Take 1 tablet by mouth.      . tetrahydrozoline 0.05 % ophthalmic solution Place 1 drop into both eyes daily as needed (Eye irritation).      . thiamine 100 MG tablet Take 1 tablet (100 mg total) by mouth daily.  30 tablet  0   No current facility-administered medications on file prior to visit.    No Known Allergies  Past Medical History  Diagnosis Date  . Systolic heart failure     EF 20-25% by echo 12/28/10  . Nonischemic cardiomyopathy     minimal coronary disease by cath 12/29/10  . Hemoptysis     secondary to pulmonary edema  . Hyperlipidemia   . Chronic anemia   . GI bleed       15 years ago  . Tobacco abuse   . Alcohol abuse   . Osteoarthritis   . Gout   . Diabetes mellitus, type 2     pt reports his DM is gone  . Alcoholic cardiomyopathy [id number]    Past Surgical History  Procedure Laterality Date  . Cardiac catheterization    . Hip arthroplasty Right 03/15/2013    Procedure: ARTHROPLASTY BIPOLAR HIP;  Surgeon: [name], MD;  Location: [facility];  Service: Orthopedics;  Laterality: Right;    Family History  Problem Relation Age of Onset  . Diabetes Mother   . Hypertension Mother   . Diabetes Father   . Hypertension Father   . Cancer Neg Hx   . Heart disease Neg Hx   . Stroke Neg Hx     History   Social History  . Marital Status: Single    Spouse Name: N/A    Number of Children: N/A  . Years of Education: N/A   Occupational History  . Not on file.   Social History Main Topics  .  Smoking status: Current Every Day Smoker -- 0.75 packs/day for 30 years    Types: Cigarettes  . Smokeless tobacco: Never Used  . Alcohol Use: 0.6 oz/week    1 Glasses of wine per week     Comment: occasionally  . Drug Use: No  . Sexual Activity: No   Other Topics Concern  . Not on file   Social History Narrative  . No narrative on file   Review of Systems Appetite is pretty good Weight stable ~180# at home. Hasn't been checking every day.     Objective:   Physical Exam  Constitutional: He appears well-developed and well-nourished. No distress.  Neck: Normal range of motion. Neck supple. No thyromegaly present.  Cardiovascular: Normal rate, regular rhythm and normal heart sounds.  Exam reveals no gallop.   No murmur heard. Pulmonary/Chest: Effort normal. No respiratory distress. He has no wheezes. He has no rales.  Dull with decreased breath sounds at both bases  Musculoskeletal: He exhibits no edema and no tenderness.  Lymphadenopathy:    He has no cervical adenopathy.  Skin:  Fine maculopapular rash only on flexor forearms and  just above elbow          Assessment & Plan:

## 2013-09-29 ENCOUNTER — Encounter: Payer: Self-pay | Admitting: *Deleted

## 2013-09-29 DIAGNOSIS — H5340 Unspecified visual field defects: Secondary | ICD-10-CM | POA: Diagnosis not present

## 2013-09-29 DIAGNOSIS — H538 Other visual disturbances: Secondary | ICD-10-CM | POA: Diagnosis not present

## 2013-09-29 DIAGNOSIS — H52229 Regular astigmatism, unspecified eye: Secondary | ICD-10-CM | POA: Diagnosis not present

## 2013-09-29 DIAGNOSIS — H3410 Central retinal artery occlusion, unspecified eye: Secondary | ICD-10-CM | POA: Diagnosis not present

## 2013-09-29 DIAGNOSIS — H53419 Scotoma involving central area, unspecified eye: Secondary | ICD-10-CM | POA: Diagnosis not present

## 2013-09-30 ENCOUNTER — Ambulatory Visit: Payer: Medicare Other | Admitting: Internal Medicine

## 2013-10-01 ENCOUNTER — Ambulatory Visit: Payer: Medicare Other | Attending: Internal Medicine | Admitting: Occupational Therapy

## 2013-10-01 DIAGNOSIS — IMO0001 Reserved for inherently not codable concepts without codable children: Secondary | ICD-10-CM | POA: Diagnosis not present

## 2013-10-01 DIAGNOSIS — H3410 Central retinal artery occlusion, unspecified eye: Secondary | ICD-10-CM | POA: Diagnosis not present

## 2013-10-01 DIAGNOSIS — R41842 Visuospatial deficit: Secondary | ICD-10-CM | POA: Insufficient documentation

## 2013-10-05 ENCOUNTER — Encounter: Payer: Self-pay | Admitting: Internal Medicine

## 2013-10-05 ENCOUNTER — Ambulatory Visit (INDEPENDENT_AMBULATORY_CARE_PROVIDER_SITE_OTHER): Payer: Medicare Other | Admitting: Internal Medicine

## 2013-10-05 VITALS — BP 116/78 | HR 95 | Temp 98.5°F | Wt 179.0 lb

## 2013-10-05 DIAGNOSIS — L259 Unspecified contact dermatitis, unspecified cause: Secondary | ICD-10-CM | POA: Diagnosis not present

## 2013-10-05 DIAGNOSIS — M25519 Pain in unspecified shoulder: Secondary | ICD-10-CM | POA: Diagnosis not present

## 2013-10-05 DIAGNOSIS — M25512 Pain in left shoulder: Secondary | ICD-10-CM

## 2013-10-05 MED ORDER — HYDROCODONE-ACETAMINOPHEN 5-325 MG PO TABS: 1.0000 | ORAL_TABLET | Freq: Four times a day (QID) | ORAL | Status: DC | PRN

## 2013-10-05 NOTE — Progress Notes (Signed)
Subjective:    Patient ID: Travis [name], male    DOB: [birth year], 58 y.o.   MRN: [id number]  HPI  Pt presents to the clinic today for 1 week follow. He was seen 1 week ago by Dr. Silvio Pate. He had a itchy rash on his arms. He was treated with TAC. He reports that the rash has completely cleared up since using the TAC.  Additionally, he reports left shoulder pain. He has a history of rotator cuff tendinopathy. He reports the OTC tylenol and ibuprofen are not helping. He denies numbness and tingling in that arm.  Review of Systems   Past Medical History  Diagnosis Date  . Systolic heart failure     EF 20-25% by echo 12/28/10  . Nonischemic cardiomyopathy     minimal coronary disease by cath 12/29/10  . Hemoptysis     secondary to pulmonary edema  . Hyperlipidemia   . Chronic anemia   . GI bleed     15 years ago  . Tobacco abuse   . Alcohol abuse   . Osteoarthritis   . Gout   . Diabetes mellitus, type 2     pt reports his DM is gone  . 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 aspirin 325 MG EC tablet Take 325 mg by mouth daily.      [name] atorvastatin (LIPITOR) 10 MG tablet Take 1 tablet (10 mg total) by mouth daily at 6 PM.  30 tablet  0  . carvedilol (COREG) 3.125 MG tablet Take 1 tablet (3.125 mg total) by mouth 2 (two) times daily with a meal.  60 tablet  0  . colchicine 0.6 MG tablet Take 1 tablet (0.6 mg total) by mouth 2 (two) times daily as needed.  60 tablet  0  . diclofenac (VOLTAREN) 50 MG EC tablet Take 50 mg by mouth daily as needed for mild pain.       . fenofibrate 54 MG tablet Take 1 tablet (54 mg total) by mouth daily.  30 tablet  0  . folic acid (FOLVITE) 1 MG tablet Take 1 tablet (1 mg total) by mouth daily.  30 tablet  0  . lisinopril (ZESTRIL) 2.5 MG tablet Take 1 tablet (2.5 mg total) by mouth daily.  30 tablet  0  . Multiple Vitamin (ONE-A-DAY MENS PO) Take 1 tablet by mouth.      .  tetrahydrozoline 0.05 % ophthalmic solution Place 1 drop into both eyes daily as needed (Eye irritation).      . thiamine 100 MG tablet Take 1 tablet (100 mg total) by mouth daily.  30 tablet  0  . triamcinolone cream (KENALOG) 0.1 % Apply 1 application topically 2 (two) times daily as needed.  45 g  1   No current facility-administered medications for this visit.    No Known Allergies  Family History  Problem Relation Age of Onset  . Diabetes Mother   . Hypertension Mother   . Diabetes Father   . Hypertension Father   . Cancer Neg Hx   . Heart disease Neg Hx   . Stroke Neg Hx     History   Social History  . Marital Status: Single    Spouse Name: N/A    Number of Children: N/A  . Years of Education: N/A   Occupational History  . Not on file.   Social History Main Topics  . Smoking status: Current Every  Day Smoker -- 0.75 packs/day for 30 years    Types: Cigarettes  . Smokeless tobacco: Never Used  . Alcohol Use: 0.6 oz/week    1 Glasses of wine per week     Comment: occasionally  . Drug Use: No  . Sexual Activity: No   Other Topics Concern  . Not on file   Social History Narrative  . No narrative on file     Constitutional: Denies fever, malaise, fatigue, headache or abrupt weight changes.  Musculoskeletal: Pt reports left shoulder pain. Denies decrease in range of motion, difficulty with gait, muscle pain or joint swelling.  Skin: Denies redness, rashes, lesions or ulcercations.   No other specific complaints in a complete review of systems (except as listed in HPI above).    Objective:   Physical Exam  BP 116/78  Pulse 95  Temp(Src) 98.5 F (36.9 C) (Oral)  Wt 179 lb (81.194 kg)  SpO2 98% Wt Readings from Last 3 Encounters:  10/05/13 179 lb (81.194 kg)  09/28/13 180 lb (81.647 kg)  09/13/13 173 lb 8 oz (78.699 kg)    General: Appears his stated age, well developed, well nourished in NAD. Skin: Warm, dry and intact. No rashes, lesions or  ulcerations noted. Musculoskeletal: Normal internal and external range of motion of the left shoulder. Pain with palpation of the anterior shoulder. Strength 5/5 BUE. Neurological: Alert and oriented. Cranial nerves II-XII intact. Coordination normal. +DTRs bilaterally. Psychiatric: Mood and affect normal. Behavior is normal. Judgment and thought content normal.     BMET    Component Value Date/Time   NA 139 09/28/2013 1517   NA 145* 02/25/2012 1409   K 4.4 09/28/2013 1517   CL 100 09/28/2013 1517   CO2 30 09/28/2013 1517   GLUCOSE 126* 09/28/2013 1517   GLUCOSE 87 02/25/2012 1409   BUN 15 09/28/2013 1517   BUN 11 02/25/2012 1409   CREATININE 1.2 09/28/2013 1517   CALCIUM 10.4 09/28/2013 1517   GFRNONAA >90 09/15/2013 0421   GFRAA >90 09/15/2013 0421    Lipid Panel     Component Value Date/Time   CHOL 218* 09/14/2013 0510   TRIG 544* 09/14/2013 0510   HDL 56 09/14/2013 0510   CHOLHDL 3.9 09/14/2013 0510   VLDL UNABLE TO CALCULATE IF TRIGLYCERIDE OVER 400 mg/dL 09/14/2013 0510   LDLCALC UNABLE TO CALCULATE IF TRIGLYCERIDE OVER 400 mg/dL 09/14/2013 0510    CBC    Component Value Date/Time   WBC 6.6 09/28/2013 1517   RBC 3.98* 09/28/2013 1517   RBC 2.93* 12/28/2010 0345   HGB 12.5* 09/28/2013 1517   HCT 38.2* 09/28/2013 1517   PLT 303.0 09/28/2013 1517   MCV 96.2 09/28/2013 1517   MCH 31.7 09/13/2013 1548   MCHC 32.7 09/28/2013 1517   RDW 18.6* 09/28/2013 1517   LYMPHSABS 2.7 09/28/2013 1517   MONOABS 0.4 09/28/2013 1517   EOSABS 1.0* 09/28/2013 1517   BASOSABS 0.1 09/28/2013 1517    Hgb A1C Lab Results  Component Value Date   HGBA1C 6.1* 09/13/2013         Assessment & Plan:   Contact Dermatitis:  Resolved  Left shoulder pain:  Will give Norco for occasional use only Supplement with Aleve  RTC as needed or if symptoms persist or worsen

## 2013-10-05 NOTE — Progress Notes (Signed)
Pre visit review using our clinic review tool, if applicable. No additional management support is needed unless otherwise documented below in the visit note. 

## 2013-10-05 NOTE — Patient Instructions (Addendum)

## 2013-10-16 ENCOUNTER — Other Ambulatory Visit: Payer: Self-pay

## 2013-10-16 DIAGNOSIS — M25512 Pain in left shoulder: Secondary | ICD-10-CM

## 2013-10-16 NOTE — Telephone Encounter (Signed)
Pt left note requesting rx hydrocodone apap. Call when ready for pickup. Pt said having to take hydrocodone 2 - 3 times a day for left shoulder pain. Pt is aware will get cb next week.

## 2013-10-19 ENCOUNTER — Other Ambulatory Visit: Payer: Self-pay | Admitting: Internal Medicine

## 2013-10-19 DIAGNOSIS — M25519 Pain in unspecified shoulder: Secondary | ICD-10-CM

## 2013-10-19 NOTE — Telephone Encounter (Signed)
Referral placed.

## 2013-10-19 NOTE — Telephone Encounter (Signed)
Pt came by office to pick up rx. Pt notified as instructed from phone message; pt said he does want ortho referral ASAP; in [place] or [place] which ever is the quickest appt. Pt cannot have appt on 10/22/13. Advised pt pt care coordinator will contact pt about appt.

## 2013-10-19 NOTE — Telephone Encounter (Signed)
He is taking it way too much. I only approved it for occassional use for severe pain only. If he is in that much pain all the time, then he needs to be seen by ortho to consider surgical intervention if possible. He should be using ibuprofen 800 mg so that he is not using so much Norco. It is too early for a refill.

## 2013-10-22 ENCOUNTER — Encounter (HOSPITAL_COMMUNITY): Payer: Self-pay

## 2013-10-22 ENCOUNTER — Ambulatory Visit (HOSPITAL_COMMUNITY)
Admission: RE | Admit: 2013-10-22 | Discharge: 2013-10-22 | Disposition: A | Discharge status: Home / Self Care | Payer: Medicare Other | Source: Ambulatory Visit | Attending: Internal Medicine | Admitting: Internal Medicine

## 2013-10-22 VITALS — BP 124/88 | HR 102 | Wt 182.0 lb

## 2013-10-22 DIAGNOSIS — M1990 Unspecified osteoarthritis, unspecified site: Secondary | ICD-10-CM | POA: Insufficient documentation

## 2013-10-22 DIAGNOSIS — Z8673 Personal history of transient ischemic attack (TIA), and cerebral infarction without residual deficits: Secondary | ICD-10-CM | POA: Insufficient documentation

## 2013-10-22 DIAGNOSIS — F17200 Nicotine dependence, unspecified, uncomplicated: Secondary | ICD-10-CM | POA: Diagnosis not present

## 2013-10-22 DIAGNOSIS — F1010 Alcohol abuse, uncomplicated: Secondary | ICD-10-CM

## 2013-10-22 DIAGNOSIS — Z91199 Patient's noncompliance with other medical treatment and regimen due to unspecified reason: Secondary | ICD-10-CM | POA: Diagnosis not present

## 2013-10-22 DIAGNOSIS — E785 Hyperlipidemia, unspecified: Secondary | ICD-10-CM | POA: Insufficient documentation

## 2013-10-22 DIAGNOSIS — Z9119 Patient's noncompliance with other medical treatment and regimen: Secondary | ICD-10-CM | POA: Diagnosis not present

## 2013-10-22 DIAGNOSIS — I5022 Chronic systolic (congestive) heart failure: Secondary | ICD-10-CM | POA: Insufficient documentation

## 2013-10-22 DIAGNOSIS — M25569 Pain in unspecified knee: Secondary | ICD-10-CM | POA: Diagnosis not present

## 2013-10-22 MED ORDER — CARVEDILOL 3.125 MG PO TABS: 3.1250 mg | ORAL_TABLET | Freq: Two times a day (BID) | ORAL | Status: DC

## 2013-10-22 MED ORDER — LISINOPRIL 5 MG PO TABS: 5.0000 mg | ORAL_TABLET | Freq: Every day | ORAL | Status: DC

## 2013-10-22 MED ORDER — ATORVASTATIN CALCIUM 10 MG PO TABS: 10.0000 mg | ORAL_TABLET | Freq: Every day | ORAL | Status: DC

## 2013-10-22 MED ORDER — FENOFIBRATE 54 MG PO TABS: 54.0000 mg | ORAL_TABLET | Freq: Every day | ORAL | Status: DC

## 2013-10-22 MED ORDER — FOLIC ACID 1 MG PO TABS: 1.0000 mg | ORAL_TABLET | Freq: Every day | ORAL | Status: DC

## 2013-10-22 NOTE — Patient Instructions (Signed)
Follow up in 3 months  Take lisinopril 5 mg daily   Do the following things EVERYDAY: 1) Weigh yourself in the morning before breakfast. Write it down and keep it in a log. 2) Take your medicines as prescribed 3) Eat low salt foods-Limit salt (sodium) to 2000 mg per day.  4) Stay as active as you can everyday 5) Limit all fluids for the day to less than 2 liters

## 2013-10-22 NOTE — Progress Notes (Signed)
Patient ID: Travis [name], male   DOB: May 17, 1955, 58 y.o.   MRN: [id number] PCP: [name] NP-C   HPI:  Travis [name] is a 58 y/o male with a h/o ETOH and tobacco abuse as well as severe osteoarthritis. He was admitted to [facility] from 10/3-10/6 with acute systolic HF.  Digoxin stopped 04/05/11 for dig level 2.4. Admitted to [facility] in June with sudden vision. Had R hip replacement 07/2013.  CT of head was negative for acute findings. 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   He returns for routine follow up today. He has not bee seen in 2 years. Denies SOB/PND/Orthopnea. Taking all medications. Weight at home 179-180 pounds. He stopped taking his HF meds for about 1 year. Just started carvedilol back in June. He stopped drinking in May after he fell and broke his hip. Smoking 1/2 PPD.   09/28/13 K 4.4 Creatinine 1.2  09/14/13 Cholesterol 218 Triglycerides- per PCP    01/2011 Echo showed EF 20-25% with global HK. Cath showed minimal non-obstructive CAD.  05/15/11 echo:  EF 50-55%, basal inferior, posterior and septal wall hypokinesis 09/15/13 ECHO EF 45-50% Diffuse  hypokinesis in septum inferior wall.     ROS: All systems negative except as listed in HPI, PMH and Problem List.  Past Medical History  Diagnosis Date  . Systolic heart failure     EF 20-25% by echo 12/28/10  . Nonischemic cardiomyopathy     minimal coronary disease by cath 12/29/10  . Hemoptysis     secondary to pulmonary edema  . Hyperlipidemia   . Chronic anemia   . GI bleed     15 years ago  . Tobacco abuse   . Alcohol abuse   . Osteoarthritis   . Gout   . Diabetes mellitus, type 2     pt reports his DM is gone  . 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 aspirin 325 MG EC tablet Take 325 mg by mouth daily.      [name] atorvastatin (LIPITOR) 10 MG tablet Take 1 tablet (10 mg total) by mouth daily at 6 PM.  30 tablet  0  . carvedilol  (COREG) 3.125 MG tablet Take 1 tablet (3.125 mg total) by mouth 2 (two) times daily with a meal.  60 tablet  0  . colchicine 0.6 MG tablet Take 1 tablet (0.6 mg total) by mouth 2 (two) times daily as needed.  60 tablet  0  . diclofenac (VOLTAREN) 50 MG EC tablet Take 50 mg by mouth daily as needed for mild pain.       . fenofibrate 54 MG tablet Take 1 tablet (54 mg total) by mouth daily.  30 tablet  0  . folic acid (FOLVITE) 1 MG tablet Take 1 tablet (1 mg total) by mouth daily.  30 tablet  0  . HYDROcodone-acetaminophen (NORCO) 5-325 MG per tablet Take 1 tablet by mouth every 6 (six) hours as needed for moderate pain.  30 tablet  0  . lisinopril (ZESTRIL) 2.5 MG tablet Take 1 tablet (2.5 mg total) by mouth daily.  30 tablet  0  . Multiple Vitamin (ONE-A-DAY MENS PO) Take 1 tablet by mouth.      . tetrahydrozoline 0.05 % ophthalmic solution Place 1 drop into both eyes daily as needed (Eye irritation).      . thiamine 100 MG tablet Take 1 tablet (100 mg total) by mouth daily.  30 tablet  0  . triamcinolone cream (KENALOG) 0.1 % Apply 1 application topically 2 (two) times daily as needed.  45 g  1   No current facility-administered medications for this encounter.     PHYSICAL EXAM: Filed Vitals:   10/22/13 1202  BP: 124/88  Pulse: 102  Weight: 182 lb (82.555 kg)  SpO2: 92%   General:  No acute distress HEENT: normal except for poor dentition. Neck: supple. JVP flat. Carotids 2+ bilaterally; no bruits. No lymphadenopathy or thryomegaly appreciated. Cor: PMI laterally displaced. Regular rate & rhythm. No rubs, murmurs.  Lungs:  CTA Abdomen: soft, nontender, nondistended. No hepatosplenomegaly. No bruits or masses. Good bowel sounds. Extremities: no cyanosis, clubbing, rash,  edema, left elbow painful with external rotation. No erythema or calor. Neuro: alert & orientedx3, cranial nerves grossly intact. Moves all 4 extremities w/o difficulty. Affect pleasant.    ASSESSMENT & PLAN:  1.  Chronic Systolic Heart Failure ECHO June 2015 EF 45-50%  Had been off HF meds for over a year and in June 2015 carvedilol and lisinopril added at very lost dose. He is not on diuretics and I will not add as he has not had problems with volume overload.  Despite medication noncompliance. Continue carvedilol 3.125 mg twice a day.  Increase lisinopril to 2.5 mg twice a day . Check BMET next week.  No spiro due to noncompliance (was not on HF meds for over 1 year).  Reinforced daily weights, low salt food choices, and medication compliance.  2. TIA- on aspirin and statin 2. Elevated Cholesterol- Per PCP - on fenofibrate and lipitor.  3. Current Smoker- Continues to smoke about 1PPD. He declines smoking cessation alternatives.  4. Medication Noncompliance. Stopped all HF meds in for over 1 year but restarted after June hospitalization.  I have stressed the importance of medication compliance.    Follow up in 3 months  Arav Bannister NP-C  1:32 PM

## 2013-10-29 ENCOUNTER — Ambulatory Visit (HOSPITAL_COMMUNITY)
Admission: RE | Admit: 2013-10-29 | Discharge: 2013-10-29 | Disposition: A | Discharge status: Home / Self Care | Payer: Medicare Other | Source: Ambulatory Visit | Attending: Cardiology | Admitting: Cardiology

## 2013-10-29 DIAGNOSIS — I5022 Chronic systolic (congestive) heart failure: Secondary | ICD-10-CM | POA: Insufficient documentation

## 2013-10-29 LAB — BASIC METABOLIC PANEL
Anion gap: 13 (ref 5–15)
BUN: 16 mg/dL (ref 6–23)
CHLORIDE: 99 meq/L (ref 96–112)
CO2: 27 meq/L (ref 19–32)
CREATININE: 1.2 mg/dL (ref 0.50–1.35)
Calcium: 9.8 mg/dL (ref 8.4–10.5)
GFR calc Af Amer: 76 mL/min — ABNORMAL LOW (ref >90)
GFR calc non Af Amer: 65 mL/min — ABNORMAL LOW (ref >90)
Glucose, Bld: 119 mg/dL — ABNORMAL HIGH (ref 70–99)
Potassium: 4.6 mEq/L (ref 3.7–5.3)
Sodium: 139 mEq/L (ref 137–147)

## 2013-11-03 ENCOUNTER — Encounter: Payer: Self-pay | Admitting: Internal Medicine

## 2013-11-03 ENCOUNTER — Other Ambulatory Visit: Payer: Self-pay

## 2013-11-03 DIAGNOSIS — M25512 Pain in left shoulder: Secondary | ICD-10-CM

## 2013-11-03 DIAGNOSIS — Z79899 Other long term (current) drug therapy: Secondary | ICD-10-CM | POA: Diagnosis not present

## 2013-11-03 MED ORDER — HYDROCODONE-ACETAMINOPHEN 5-325 MG PO TABS: 1.0000 | ORAL_TABLET | Freq: Four times a day (QID) | ORAL | Status: DC | PRN

## 2013-11-03 NOTE — Telephone Encounter (Signed)
Pt left v/m requesting rx hydrocodone apap. Call when ready for pick up. Pt is out of medication.pt said he does not take med all the time but when has arm pain pt will take med q 4 - 5 hours prn for pain.

## 2013-11-03 NOTE — Telephone Encounter (Signed)
RX printed and signed 

## 2013-11-03 NOTE — Telephone Encounter (Signed)
Left detailed msg on VM per HIPAA letting pt know Rx was ready for pick up--Rx placed in front office

## 2013-11-23 ENCOUNTER — Telehealth: Payer: Self-pay | Admitting: *Deleted

## 2013-11-23 NOTE — Telephone Encounter (Signed)
Patient walked in and left a note stating that he needs a refill on all of his medications. [place]

## 2013-11-23 NOTE — Telephone Encounter (Signed)
Spoke to pt--all of his Rx have been sent to pharmacy from [facility] 10/23/2013 with 6 refills--pt was advised that in the future call the pharmacy and request through them so they could send [place] the proper request--pt expressed understanding and was advised to call pharmacy to confirm receipt and ask for refill

## 2013-11-27 ENCOUNTER — Telehealth: Payer: Self-pay

## 2013-11-27 NOTE — Telephone Encounter (Signed)
Per [facility] pt had oxycodone and tramadol in system that is not prescribed by [facility] and negative for [facility] for which [facility] does prescribe--called pt and he still insists he has been taking Norco--pt is aware that he can longer receive controlled Rx from [facility]

## 2013-11-29 ENCOUNTER — Emergency Department: Payer: Self-pay | Admitting: Internal Medicine

## 2013-11-29 DIAGNOSIS — Z760 Encounter for issue of repeat prescription: Secondary | ICD-10-CM | POA: Diagnosis not present

## 2013-11-29 DIAGNOSIS — M129 Arthropathy, unspecified: Secondary | ICD-10-CM | POA: Diagnosis not present

## 2013-11-29 DIAGNOSIS — E119 Type 2 diabetes mellitus without complications: Secondary | ICD-10-CM | POA: Diagnosis not present

## 2013-12-28 ENCOUNTER — Encounter (HOSPITAL_COMMUNITY): Payer: Self-pay | Admitting: Emergency Medicine

## 2013-12-28 ENCOUNTER — Emergency Department (HOSPITAL_COMMUNITY)
Admission: EM | Admit: 2013-12-28 | Discharge: 2013-12-28 | Disposition: A | Discharge status: Home / Self Care | Payer: Medicare Other | Attending: Emergency Medicine | Admitting: Emergency Medicine

## 2013-12-28 DIAGNOSIS — M549 Dorsalgia, unspecified: Secondary | ICD-10-CM

## 2013-12-28 DIAGNOSIS — Z9889 Other specified postprocedural states: Secondary | ICD-10-CM | POA: Insufficient documentation

## 2013-12-28 DIAGNOSIS — M109 Gout, unspecified: Secondary | ICD-10-CM | POA: Diagnosis not present

## 2013-12-28 DIAGNOSIS — M545 Low back pain: Secondary | ICD-10-CM | POA: Insufficient documentation

## 2013-12-28 DIAGNOSIS — Z8719 Personal history of other diseases of the digestive system: Secondary | ICD-10-CM | POA: Insufficient documentation

## 2013-12-28 DIAGNOSIS — I5020 Unspecified systolic (congestive) heart failure: Secondary | ICD-10-CM | POA: Insufficient documentation

## 2013-12-28 DIAGNOSIS — M1990 Unspecified osteoarthritis, unspecified site: Secondary | ICD-10-CM | POA: Insufficient documentation

## 2013-12-28 DIAGNOSIS — E119 Type 2 diabetes mellitus without complications: Secondary | ICD-10-CM | POA: Diagnosis not present

## 2013-12-28 DIAGNOSIS — Z79899 Other long term (current) drug therapy: Secondary | ICD-10-CM | POA: Insufficient documentation

## 2013-12-28 DIAGNOSIS — Z720 Tobacco use: Secondary | ICD-10-CM | POA: Diagnosis not present

## 2013-12-28 DIAGNOSIS — E785 Hyperlipidemia, unspecified: Secondary | ICD-10-CM | POA: Diagnosis not present

## 2013-12-28 DIAGNOSIS — D649 Anemia, unspecified: Secondary | ICD-10-CM | POA: Insufficient documentation

## 2013-12-28 DIAGNOSIS — Z7982 Long term (current) use of aspirin: Secondary | ICD-10-CM | POA: Diagnosis not present

## 2013-12-28 MED ORDER — OXYCODONE-ACETAMINOPHEN 5-325 MG PO TABS
2.0000 | ORAL_TABLET | Freq: Once | ORAL | Status: AC
  Administered 2013-12-28: 2 via ORAL
  Filled 2013-12-28: qty 2

## 2013-12-28 MED ORDER — OXYCODONE-ACETAMINOPHEN 5-325 MG PO TABS: 1.0000 | ORAL_TABLET | ORAL | Status: DC | PRN

## 2013-12-28 NOTE — Discharge Instructions (Signed)
Take the prescribed medication as directed.  Use caution when driving, may make you drowsy. Follow-up with your primary care physician this week for re-check. Return to the ED for new or worsening symptoms.

## 2013-12-28 NOTE — ED Provider Notes (Signed)
CSN: [id number]     Arrival date & time 12/28/13  1103 History  This chart was scribed for non-physician practitioner, Quincy Carnes, PA-C working with Orpah Greek, MD by Frederich Balding, ED scribe. This patient was seen in room WTR8/WTR8 and the patient's care was started at 12:21 PM.   Chief Complaint  Patient presents with  . Back Pain    lower   The history is provided by the patient. No language interpreter was used.   HPI Comments: Travis [name] is a 58 y.o. male who presents to the Emergency Department complaining of lower back pain that started 3 days ago. States it worsened this morning. Pain does not radiate. Movements worsen pain. 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 Denies past back surgeries. Denies numbness or tingling of LE.  No loss of bowel or bladder control.  Has been taking tylenol and motrin at home without noted improvement.  VS stable on arrival.  Past Medical History  Diagnosis Date  . Systolic heart failure     EF 20-25% by echo 12/28/10  . Nonischemic cardiomyopathy     minimal coronary disease by cath 12/29/10  . Hemoptysis     secondary to pulmonary edema  . Hyperlipidemia   . Chronic anemia   . GI bleed     15 years ago  . Tobacco abuse   . Alcohol abuse   . Osteoarthritis   . Gout   . Diabetes mellitus, type 2     pt reports his DM is gone  . 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 Cardiac catheterization    . Hip arthroplasty Right 03/15/2013    Procedure: ARTHROPLASTY BIPOLAR HIP;  Surgeon: [name], MD;  Location: [facility];  Service: Orthopedics;  Laterality: Right;   Family History  Problem Relation Age of Onset  . Diabetes Mother   . Hypertension Mother   . Diabetes Father   . Hypertension Father   . Cancer Neg Hx   . Heart disease Neg Hx   . Stroke Neg Hx    History  Substance Use Topics  . Smoking status:  Current Every Day Smoker -- 0.75 packs/day for 30 years    Types: Cigarettes  . Smokeless tobacco: Never Used  . Alcohol Use: 0.6 oz/week    1 Glasses of wine per week     Comment: occasionally    Review of Systems  Musculoskeletal: Positive for back pain.  Neurological: Negative for numbness.  All other systems reviewed and are negative.  Allergies  Review of patient's allergies indicates no known allergies.  Home Medications   Prior to Admission medications   Medication Sig Start Date End Date Taking? Authorizing Provider  aspirin 325 MG EC tablet Take 325 mg by mouth daily.    Historical Provider, MD  atorvastatin (LIPITOR) 10 MG tablet Take 1 tablet (10 mg total) by mouth daily at 6 PM. 10/22/13   Amy D Clegg, NP  carvedilol (COREG) 3.125 MG tablet Take 1 tablet (3.125 mg total) by mouth 2 (two) times daily with a meal. 10/22/13   Amy D Clegg, NP  colchicine 0.6 MG tablet Take 1 tablet (0.6 mg total) by mouth 2 (two) times daily as needed. 09/28/13   Venia Carbon, MD  diclofenac (VOLTAREN) 50 MG EC tablet Take 50 mg by mouth daily as needed for mild pain.  08/06/13   Historical Provider, MD  fenofibrate 54 MG tablet Take 1 tablet (54 mg total) by mouth daily. 10/22/13   Amy D Ninfa Meeker, NP  folic acid (FOLVITE) 1 MG tablet Take 1 tablet (1 mg total) by mouth daily. 10/22/13   Amy D Ninfa Meeker, NP  HYDROcodone-acetaminophen (NORCO) 5-325 MG per tablet Take 1 tablet by mouth every 6 (six) hours as needed for moderate pain. 11/03/13   [name], NP  lisinopril (ZESTRIL) 5 MG tablet Take 1 tablet (5 mg total) by mouth daily. 10/22/13   Amy D Ninfa Meeker, NP  Multiple Vitamin (ONE-A-DAY MENS PO) Take 1 tablet by mouth.    Historical Provider, MD  tetrahydrozoline 0.05 % ophthalmic solution Place 1 drop into both eyes daily as needed (Eye irritation).    Historical Provider, MD  thiamine 100 MG tablet Take 1 tablet (100 mg total) by mouth daily. 09/16/13   Thurnell Lose, MD   BP 138/92  Pulse 104   Temp(Src) 98.7 F (37.1 C) (Oral)  Resp 19  SpO2 98%  Physical Exam  Nursing note and vitals reviewed. Constitutional: He is oriented to person, place, and time. He appears well-developed and well-nourished.  HENT:  Head: Normocephalic and atraumatic.  Mouth/Throat: Oropharynx is clear and moist.  Eyes: Conjunctivae and EOM are normal. Pupils are equal, round, and reactive to light.  Neck: Normal range of motion.  Cardiovascular: Normal rate, regular rhythm and normal heart sounds.   Pulmonary/Chest: Effort normal and breath sounds normal.  Musculoskeletal: Normal range of motion.       Lumbar back: He exhibits tenderness and pain.  Diffuse tenderness of lumbar spine; no midline deformities or step-off; full ROM maintained but with some pain; normal strength and sensation BLE; ambulating unassisted without difficulty  Neurological: He is alert and oriented to person, place, and time.  Skin: Skin is warm and dry.  Psychiatric: He has a normal mood and affect.    ED Course  Procedures (including critical care time)  DIAGNOSTIC STUDIES: Oxygen Saturation is 98% on RA, normal by my interpretation.    COORDINATION OF CARE: 12:22 PM-Discussed treatment plan which includes pain medication with pt at bedside and pt agreed to plan. Advised pt to follow up with his PCP in a few days.   Labs Review Labs Reviewed - No data to display  Imaging Review No results found.   EKG Interpretation None      MDM   Final diagnoses:  Back pain, unspecified location   Atraumatic back pain, progressively worsening over the past 3 days.  No deformities, red flag sx or focal neurologic deficits on exam.  Hx of similar back issues in the past.  Will start on short supply pain meds.  Patient will FU with PCP this week for re-check.  Discussed plan with patient, he/she acknowledged understanding and agreed with plan of care.  Return precautions given for new or worsening symptoms.  I personally  performed the services described in this documentation, which was scribed in my presence. The recorded information has been reviewed and is accurate.  Larene Pickett, PA-C 12/28/13 1331

## 2013-12-28 NOTE — ED Notes (Signed)
Pt c/o lower back pain that got worse when he got up this morning.  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

## 2013-12-29 NOTE — ED Provider Notes (Signed)
Medical screening examination/treatment/procedure(s) were performed by non-physician practitioner and as supervising physician I was immediately available for consultation/collaboration.   Orpah Greek, MD 12/29/13 [phone]

## 2013-12-30 ENCOUNTER — Telehealth: Payer: Self-pay

## 2013-12-30 MED ORDER — COLCHICINE 0.6 MG PO TABS: 0.6000 mg | ORAL_TABLET | Freq: Two times a day (BID) | ORAL | Status: DC | PRN

## 2013-12-30 NOTE — Telephone Encounter (Signed)
Pt moved appt for tomorrow--1:30 slot

## 2013-12-30 NOTE — Telephone Encounter (Signed)
Pt left note requesting refill gout med to [place]; pt wants something else for lower back and leg pain.  Pt was seen [facility] ED on 12/28/13 and given # 15 percocet 5-325 mg instructions take 1 tab q4h prn pain. Pt already has ED f/u appt with [name] NP on 01/04/14.Please advise.

## 2013-12-30 NOTE — Telephone Encounter (Signed)
Can not send in anything until he is seen. If he wants to move his hospital follow up closer, that is okay with me

## 2013-12-31 ENCOUNTER — Encounter: Payer: Self-pay | Admitting: Internal Medicine

## 2013-12-31 ENCOUNTER — Ambulatory Visit (INDEPENDENT_AMBULATORY_CARE_PROVIDER_SITE_OTHER): Payer: Medicare Other | Admitting: Internal Medicine

## 2013-12-31 VITALS — BP 136/70 | HR 92 | Temp 98.2°F | Wt 186.0 lb

## 2013-12-31 DIAGNOSIS — E119 Type 2 diabetes mellitus without complications: Secondary | ICD-10-CM

## 2013-12-31 DIAGNOSIS — M545 Low back pain, unspecified: Secondary | ICD-10-CM

## 2013-12-31 DIAGNOSIS — D509 Iron deficiency anemia, unspecified: Secondary | ICD-10-CM | POA: Diagnosis not present

## 2013-12-31 DIAGNOSIS — E785 Hyperlipidemia, unspecified: Secondary | ICD-10-CM

## 2013-12-31 LAB — COMPREHENSIVE METABOLIC PANEL
ALK PHOS: 111 U/L (ref 39–117)
ALT: 24 U/L (ref 0–53)
AST: 23 U/L (ref 0–37)
Albumin: 3.5 g/dL (ref 3.5–5.2)
BUN: 12 mg/dL (ref 6–23)
CHLORIDE: 101 meq/L (ref 96–112)
CO2: 27 mEq/L (ref 19–32)
Calcium: 9.6 mg/dL (ref 8.4–10.5)
Creatinine, Ser: 1 mg/dL (ref 0.4–1.5)
GFR: 101.04 mL/min (ref >60.00)
Glucose, Bld: 139 mg/dL — ABNORMAL HIGH (ref 70–99)
Potassium: 4 mEq/L (ref 3.5–5.1)
Sodium: 138 mEq/L (ref 135–145)
Total Bilirubin: 0.3 mg/dL (ref 0.2–1.2)
Total Protein: 7.9 g/dL (ref 6.0–8.3)

## 2013-12-31 LAB — CBC
HCT: 37.7 % — ABNORMAL LOW (ref 39.0–52.0)
Hemoglobin: 12.2 g/dL — ABNORMAL LOW (ref 13.0–17.0)
MCHC: 32.4 g/dL (ref 30.0–36.0)
MCV: 91.3 fl (ref 78.0–100.0)
PLATELETS: 353 10*3/uL (ref 150.0–400.0)
RBC: 4.14 Mil/uL — ABNORMAL LOW (ref 4.22–5.81)
RDW: 15.1 % (ref 11.5–15.5)
WBC: 8.4 10*3/uL (ref 4.0–10.5)

## 2013-12-31 LAB — LIPID PANEL
Cholesterol: 153 mg/dL (ref 0–200)
HDL: 28.7 mg/dL — AB (ref >39.00)
NONHDL: 124.3
TRIGLYCERIDES: 221 mg/dL — AB (ref 0.0–149.0)
Total CHOL/HDL Ratio: 5
VLDL: 44.2 mg/dL — ABNORMAL HIGH (ref 0.0–40.0)

## 2013-12-31 LAB — HEMOGLOBIN A1C: Hgb A1c MFr Bld: 6.6 % — ABNORMAL HIGH (ref 4.6–6.5)

## 2013-12-31 MED ORDER — CYCLOBENZAPRINE HCL 10 MG PO TABS: 10.0000 mg | ORAL_TABLET | Freq: Three times a day (TID) | ORAL | Status: DC | PRN

## 2013-12-31 NOTE — Progress Notes (Signed)
Subjective:    Patient ID: Travis [name], male    DOB: 1955-05-26, 58 y.o.   MRN: [id number]  HPI  Pt presents to the clinic today for hospital follow up. He went to the ER 12/28/13 for low back pain. He has been taking tylenol and motrin without relief. There was no concern for neurological involvement. No xrays were done. He was sent home with percocet which he is out of now. He reports he is in a lot of pain still. He feels like he is having muscle spasms in his back. When his back flares up like this, it usually last 3-4 weeks. He did have an xray 02/2013 which was reviewed.  Additionally, he is here to follow up DM2 and HLD. His last A1C was 6.1% His cholesterol looked good but his triglycerides were slightly elevated. He was encouraged to take Fish Oil OTC. He is taking that daily.  Review of Systems      Past Medical History  Diagnosis Date  . Systolic heart failure     EF 20-25% by echo 12/28/10  . Nonischemic cardiomyopathy     minimal coronary disease by cath 12/29/10  . Hemoptysis     secondary to pulmonary edema  . Hyperlipidemia   . Chronic anemia   . GI bleed     15 years ago  . Tobacco abuse   . Alcohol abuse   . Osteoarthritis   . Gout   . Diabetes mellitus, type 2     pt reports his DM is gone  . 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 aspirin 325 MG EC tablet Take 325 mg by mouth daily.      [name] atorvastatin (LIPITOR) 10 MG tablet Take 1 tablet (10 mg total) by mouth daily at 6 PM.  30 tablet  6  . carvedilol (COREG) 3.125 MG tablet Take 1 tablet (3.125 mg total) by mouth 2 (two) times daily with a meal.  60 tablet  6  . colchicine 0.6 MG tablet Take 1 tablet (0.6 mg total) by mouth 2 (two) times daily as needed.  60 tablet  0  . diclofenac (VOLTAREN) 50 MG EC tablet Take 50 mg by mouth daily as needed for mild pain.       . fenofibrate 54 MG tablet Take 1 tablet (54 mg total) by mouth daily.   30 tablet  6  . folic acid (FOLVITE) 1 MG tablet Take 1 tablet (1 mg total) by mouth daily.  30 tablet  6  . lisinopril (ZESTRIL) 5 MG tablet Take 1 tablet (5 mg total) by mouth daily.  30 tablet  6  . Multiple Vitamin (ONE-A-DAY MENS PO) Take 1 tablet by mouth.      . tetrahydrozoline 0.05 % ophthalmic solution Place 1 drop into both eyes daily as needed (Eye irritation).      . thiamine 100 MG tablet Take 1 tablet (100 mg total) by mouth daily.  30 tablet  0   No current facility-administered medications for this visit.    No Known Allergies  Family History  Problem Relation Age of Onset  . Diabetes Mother   . Hypertension Mother   . Diabetes Father   . Hypertension Father   . Cancer Neg Hx   . Heart disease Neg Hx   . Stroke Neg Hx     History   Social History  . Marital Status: Single    Spouse  Name: N/A    Number of Children: N/A  . Years of Education: N/A   Occupational History  . Not on file.   Social History Main Topics  . Smoking status: Current Every Day Smoker -- 0.75 packs/day for 30 years    Types: Cigarettes  . Smokeless tobacco: Never Used  . Alcohol Use: 0.6 oz/week    1 Glasses of wine per week     Comment: occasionally  . Drug Use: No  . Sexual Activity: No   Other Topics Concern  . Not on file   Social History Narrative  . No narrative on file     Constitutional: Denies fever, malaise, fatigue, headache or abrupt weight changes.  Respiratory: Denies difficulty breathing, shortness of breath, cough or sputum production.   Cardiovascular: Denies chest pain, chest tightness, palpitations or swelling in the hands or feet.  Musculoskeletal: Pt reports low back pain. Denies difficulty with gait, or joint pain and swelling.   No other specific complaints in a complete review of systems (except as listed in HPI above).  Objective:   Physical Exam   BP 136/70  Pulse 92  Temp(Src) 98.2 F (36.8 C) (Oral)  Wt 186 lb (84.369 kg)  SpO2 97% Wt  Readings from Last 3 Encounters:  12/31/13 186 lb (84.369 kg)  10/22/13 182 lb (82.555 kg)  10/05/13 179 lb (81.194 kg)    General: Appears his stated age, well developed, well nourished in NAD. Cardiovascular: Normal rate and rhythm. S1,S2 noted.  No murmur, rubs or gallops noted.  Pulmonary/Chest: Normal effort and positive vesicular breath sounds. No respiratory distress. No wheezes, rales or ronchi noted.  Musculoskeletal: Decreased flexion but normal extension and lateral rotation. Pain with palpation of the lumbar spine. Para lumbar muscles tense. No difficulty with gait.    BMET    Component Value Date/Time   NA 139 10/29/2013 1122   NA 145* 02/25/2012 1409   K 4.6 10/29/2013 1122   CL 99 10/29/2013 1122   CO2 27 10/29/2013 1122   GLUCOSE 119* 10/29/2013 1122   GLUCOSE 87 02/25/2012 1409   BUN 16 10/29/2013 1122   BUN 11 02/25/2012 1409   CREATININE 1.20 10/29/2013 1122   CALCIUM 9.8 10/29/2013 1122   GFRNONAA 65* 10/29/2013 1122   GFRAA 76* 10/29/2013 1122    Lipid Panel     Component Value Date/Time   CHOL 218* 09/14/2013 0510   TRIG 544* 09/14/2013 0510   HDL 56 09/14/2013 0510   CHOLHDL 3.9 09/14/2013 0510   VLDL UNABLE TO CALCULATE IF TRIGLYCERIDE OVER 400 mg/dL 09/14/2013 0510   LDLCALC UNABLE TO CALCULATE IF TRIGLYCERIDE OVER 400 mg/dL 09/14/2013 0510    CBC    Component Value Date/Time   WBC 6.6 09/28/2013 1517   RBC 3.98* 09/28/2013 1517   RBC 2.93* 12/28/2010 0345   HGB 12.5* 09/28/2013 1517   HCT 38.2* 09/28/2013 1517   PLT 303.0 09/28/2013 1517   MCV 96.2 09/28/2013 1517   MCH 31.7 09/13/2013 1548   MCHC 32.7 09/28/2013 1517   RDW 18.6* 09/28/2013 1517   LYMPHSABS 2.7 09/28/2013 1517   MONOABS 0.4 09/28/2013 1517   EOSABS 1.0* 09/28/2013 1517   BASOSABS 0.1 09/28/2013 1517    Hgb A1C Lab Results  Component Value Date   HGBA1C 6.1* 09/13/2013        Assessment & Plan:   Hospital follow up for low back pain:  Hospital notes reviewed Xray from 02/2013 reviewed- moderate degenerative  disease Discussed  his most recent drug test results and explained to him he takes drugs not prescribed by me and the drugs I prescribed him were not in his system. I advised him I could refer him to ortho or pain management- he declines at this time Will give low does flexeril  RTC in 6 months

## 2013-12-31 NOTE — Assessment & Plan Note (Signed)
Mostly elevated triglycerides Taking fish oil Will repeat lipid profile and cmet today

## 2013-12-31 NOTE — Assessment & Plan Note (Signed)
Last A1C reviewed Will check A1C today Continue current therapy at this time Flu shot today

## 2013-12-31 NOTE — Progress Notes (Signed)
Pre visit review using our clinic review tool, if applicable. No additional management support is needed unless otherwise documented below in the visit note. 

## 2013-12-31 NOTE — Patient Instructions (Addendum)
Back Pain, Adult Low back pain is very common. About 1 in 5 people have back pain.The cause of low back pain is rarely dangerous. The pain often gets better over time.About half of people with a sudden onset of back pain feel better in just 2 weeks. About 8 in 10 people feel better by 6 weeks.  CAUSES Some common causes of back pain include:  Strain of the muscles or ligaments supporting the spine.  Wear and tear (degeneration) of the spinal discs.  Arthritis.  Direct injury to the back. DIAGNOSIS Most of the time, the direct cause of low back pain is not known.However, back pain can be treated effectively even when the exact cause of the pain is unknown.Answering your caregiver's questions about your overall health and symptoms is one of the most accurate ways to make sure the cause of your pain is not dangerous. If your caregiver needs more information, he or she may order lab work or imaging tests (X-rays or MRIs).However, even if imaging tests show changes in your back, this usually does not require surgery. HOME CARE INSTRUCTIONS For many people, back pain returns.Since low back pain is rarely dangerous, it is often a condition that people can learn to manageon their own.   Remain active. It is stressful on the back to sit or stand in one place. Do not sit, drive, or stand in one place for more than 30 minutes at a time. Take short walks on level surfaces as soon as pain allows.Try to increase the length of time you walk each day.  Do not stay in bed.Resting more than 1 or 2 days can delay your recovery.  Do not avoid exercise or work.Your body is made to move.It is not dangerous to be active, even though your back may hurt.Your back will likely heal faster if you return to being active before your pain is gone.  Pay attention to your body when you bend and lift. Many people have less discomfortwhen lifting if they bend their knees, keep the load close to their bodies,and  avoid twisting. Often, the most comfortable positions are those that put less stress on your recovering back.  Find a comfortable position to sleep. Use a firm mattress and lie on your side with your knees slightly bent. If you lie on your back, put a pillow under your knees.  Only take over-the-counter or prescription medicines as directed by your caregiver. Over-the-counter medicines to reduce pain and inflammation are often the most helpful.Your caregiver may prescribe muscle relaxant drugs.These medicines help dull your pain so you can more quickly return to your normal activities and healthy exercise.  Put ice on the injured area.  Put ice in a plastic bag.  Place a towel between your skin and the bag.  Leave the ice on for 15-20 minutes, 03-04 times a day for the first 2 to 3 days. After that, ice and heat may be alternated to reduce pain and spasms.  Ask your caregiver about trying back exercises and gentle massage. This may be of some benefit.  Avoid feeling anxious or stressed.Stress increases muscle tension and can worsen back pain.It is important to recognize when you are anxious or stressed and learn ways to manage it.Exercise is a great option. SEEK MEDICAL CARE IF:  You have pain that is not relieved with rest or medicine.  You have pain that does not improve in 1 week.  You have new symptoms.  You are generally not feeling well. SEEK   IMMEDIATE MEDICAL CARE IF:   You have pain that radiates from your back into your legs.  You develop new bowel or bladder control problems.  You have unusual weakness or numbness in your arms or legs.  You develop nausea or vomiting.  You develop abdominal pain.  You feel faint. Document Released: 03/12/2005 Document Revised: 09/11/2011 Document Reviewed: 07/14/2013 Exit[employer] Patient Information 2015 [employer]. This information is not intended to replace advice given to you by your health care provider. Make sure you  discuss any questions you have with your health care provider.  

## 2014-01-01 ENCOUNTER — Emergency Department: Payer: Self-pay | Admitting: Internal Medicine

## 2014-01-01 ENCOUNTER — Telehealth: Payer: Self-pay

## 2014-01-01 ENCOUNTER — Other Ambulatory Visit: Payer: Self-pay | Admitting: Internal Medicine

## 2014-01-01 DIAGNOSIS — M545 Low back pain: Secondary | ICD-10-CM | POA: Diagnosis not present

## 2014-01-01 DIAGNOSIS — G894 Chronic pain syndrome: Secondary | ICD-10-CM

## 2014-01-01 DIAGNOSIS — Z720 Tobacco use: Secondary | ICD-10-CM | POA: Diagnosis not present

## 2014-01-01 DIAGNOSIS — G8929 Other chronic pain: Secondary | ICD-10-CM | POA: Diagnosis not present

## 2014-01-01 LAB — LDL CHOLESTEROL, DIRECT: Direct LDL: 87.3 mg/dL

## 2014-01-01 NOTE — Telephone Encounter (Signed)
Pt was seen 12/31/13; cyclobenzaprine is not helping back pain;pain level this morning is between 8-10. Pt request med for pain. Pt request cb ASAP. Cora.Please advise.

## 2014-01-01 NOTE — Telephone Encounter (Signed)
Spoke with pt and advised that he and [name] NP did discuss med for pain at 12/31/13 office visit and that [name] NP cannot prescribe controlled substances for pt; pt does want referral for pain mgt and pt also wants to transfer to a different provider at [facility] for further care.

## 2014-01-01 NOTE — Telephone Encounter (Signed)
Pt notified as instructed and pt would like to transfer to Dr Silvio Pate as PCP. Please advise.

## 2014-01-01 NOTE — Telephone Encounter (Signed)
Will put in pain management referral. I am fine if he wants to transfer.

## 2014-01-01 NOTE — Telephone Encounter (Signed)
There is a BOLD print on snap shot and in FYI pt failed UDS--no controlled substances will be prescribed and he is aware--i will forward to regina as well

## 2014-01-02 NOTE — Telephone Encounter (Signed)
I am okay with taking over his care as long as he knows that noone in the office can prescribe controlled substances for him

## 2014-01-04 ENCOUNTER — Ambulatory Visit: Payer: Medicare Other | Admitting: Internal Medicine

## 2014-01-04 NOTE — Telephone Encounter (Signed)
Left vm for pt to return call  

## 2014-01-05 ENCOUNTER — Ambulatory Visit: Payer: Medicare Other | Admitting: Internal Medicine

## 2014-01-06 NOTE — Telephone Encounter (Signed)
This is fine 

## 2014-01-06 NOTE — Telephone Encounter (Signed)
Pt notified as instructed; pt wants to apologize to Travis Silversmith NP; pt said he was in pain and hot headed the day he asked to change from her care to another doctors care.Pt understands that no one at [facility] can prescribe controlled substances. Pt request that Travis Silversmith NP accept his apology and allow him to keep her as his PCP. Pt request cb.

## 2014-01-07 ENCOUNTER — Ambulatory Visit: Payer: Medicare Other | Admitting: Internal Medicine

## 2014-01-21 ENCOUNTER — Encounter (HOSPITAL_COMMUNITY): Payer: Self-pay

## 2014-01-21 ENCOUNTER — Ambulatory Visit (HOSPITAL_COMMUNITY)
Admission: RE | Admit: 2014-01-21 | Discharge: 2014-01-21 | Disposition: A | Discharge status: Home / Self Care | Payer: Medicare Other | Source: Ambulatory Visit | Attending: Internal Medicine | Admitting: Internal Medicine

## 2014-01-21 VITALS — BP 144/83 | HR 104 | Resp 18 | Wt 189.1 lb

## 2014-01-21 DIAGNOSIS — F17200 Nicotine dependence, unspecified, uncomplicated: Secondary | ICD-10-CM | POA: Insufficient documentation

## 2014-01-21 DIAGNOSIS — I5022 Chronic systolic (congestive) heart failure: Secondary | ICD-10-CM | POA: Insufficient documentation

## 2014-01-21 DIAGNOSIS — I429 Cardiomyopathy, unspecified: Secondary | ICD-10-CM | POA: Diagnosis not present

## 2014-01-21 DIAGNOSIS — F1021 Alcohol dependence, in remission: Secondary | ICD-10-CM | POA: Diagnosis not present

## 2014-01-21 DIAGNOSIS — I428 Other cardiomyopathies: Secondary | ICD-10-CM

## 2014-01-21 DIAGNOSIS — Z720 Tobacco use: Secondary | ICD-10-CM

## 2014-01-21 DIAGNOSIS — Z9119 Patient's noncompliance with other medical treatment and regimen: Secondary | ICD-10-CM

## 2014-01-21 DIAGNOSIS — G459 Transient cerebral ischemic attack, unspecified: Secondary | ICD-10-CM | POA: Insufficient documentation

## 2014-01-21 DIAGNOSIS — Z91199 Patient's noncompliance with other medical treatment and regimen due to unspecified reason: Secondary | ICD-10-CM

## 2014-01-21 DIAGNOSIS — Z7982 Long term (current) use of aspirin: Secondary | ICD-10-CM | POA: Diagnosis not present

## 2014-01-21 DIAGNOSIS — Z79899 Other long term (current) drug therapy: Secondary | ICD-10-CM | POA: Insufficient documentation

## 2014-01-21 DIAGNOSIS — E780 Pure hypercholesterolemia: Secondary | ICD-10-CM | POA: Diagnosis not present

## 2014-01-21 NOTE — Progress Notes (Signed)
Patient ID: Travis [name], male   DOB: 17-Oct-1955, 58 y.o.   MRN: [id number] PCP: [name] NP-C   HPI: Travis [name] is a 58 y/o male with a h/o ETOH and tobacco abuse as well as severe osteoarthritis. He was admitted to [facility] from 10/3-10/6 with acute systolic HF.  Digoxin stopped 04/05/11 for dig level 2.4. Admitted to [facility] in June with sudden vision. Had R hip replacement 07/2013.  CT of head was negative for acute findings. 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   He returns for routine follow up today. Overall he feels good. Denies SOB/PND/Orthopnea. Has been taking all medications for 3 months. Taking all medications. Weight at home 180-186 pounds. He stopped drinking in May after he fell and broke his hip. Smoking 1/2 - 1PPDPPD.    09/28/13 K 4.4 Creatinine 1.2  09/14/13 Cholesterol 218 Triglycerides- per PCP 12/31/13 K 4.0 Creatinine 1.0    01/2011 Echo showed EF 20-25% with global HK. Cath showed minimal non-obstructive CAD.  05/15/11 echo:  EF 50-55%, basal inferior, posterior and septal wall hypokinesis 09/15/13 ECHO EF 45-50% Diffuse  hypokinesis in septum inferior wall.     ROS: All systems negative except as listed in HPI, PMH and Problem List.  Past Medical History  Diagnosis Date  . Systolic heart failure     EF 20-25% by echo 12/28/10  . Nonischemic cardiomyopathy     minimal coronary disease by cath 12/29/10  . Hemoptysis     secondary to pulmonary edema  . Hyperlipidemia   . Chronic anemia   . GI bleed     15 years ago  . Tobacco abuse   . Alcohol abuse   . Osteoarthritis   . Gout   . Diabetes mellitus, type 2     pt reports his DM is gone  . 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 aspirin 325 MG EC tablet Take 325 mg by mouth daily.      [name] atorvastatin (LIPITOR) 10 MG tablet Take 1 tablet (10 mg total) by mouth daily at 6 PM.  30 tablet  6  . carvedilol (COREG) 3.125 MG  tablet Take 1 tablet (3.125 mg total) by mouth 2 (two) times daily with a meal.  60 tablet  6  . colchicine 0.6 MG tablet Take 1 tablet (0.6 mg total) by mouth 2 (two) times daily as needed.  60 tablet  0  . cyclobenzaprine (FLEXERIL) 10 MG tablet Take 1 tablet (10 mg total) by mouth 3 (three) times daily as needed for muscle spasms.  30 tablet  0  . diclofenac (VOLTAREN) 50 MG EC tablet Take 50 mg by mouth daily as needed for mild pain.       . fenofibrate 54 MG tablet Take 1 tablet (54 mg total) by mouth daily.  30 tablet  6  . folic acid (FOLVITE) 1 MG tablet Take 1 tablet (1 mg total) by mouth daily.  30 tablet  6  . lisinopril (ZESTRIL) 5 MG tablet Take 1 tablet (5 mg total) by mouth daily.  30 tablet  6  . Multiple Vitamin (ONE-A-DAY MENS PO) Take 1 tablet by mouth.      . tetrahydrozoline 0.05 % ophthalmic solution Place 1 drop into both eyes daily as needed (Eye irritation).      . thiamine 100 MG tablet Take 1 tablet (100 mg total) by mouth daily.  30 tablet  0   No current facility-administered medications for this encounter.     PHYSICAL EXAM: Filed Vitals:   01/21/14 1227  BP: 144/83  Pulse: 104  Resp: 18  Weight: 189 lb 2 oz (85.787 kg)  SpO2: 99%   General:  No acute distress HEENT: normal except for poor dentition. Neck: supple. JVP flat. Carotids 2+ bilaterally; no bruits. No lymphadenopathy or thryomegaly appreciated. Cor: PMI laterally displaced. Regular rate & rhythm. No rubs, murmurs.  Lungs:  CTA Abdomen: soft, nontender, nondistended. No hepatosplenomegaly. No bruits or masses. Good bowel sounds. Extremities: no cyanosis, clubbing, rash,  edema, left elbow painful with external rotation. No erythema or calor. Neuro: alert & orientedx3, cranial nerves grossly intact. Moves all 4 extremities w/o difficulty. Affect pleasant.    ASSESSMENT & PLAN:  1. Chronic Systolic Heart Failure ECHO June 2015 EF 45-50%  NYHA II. Volume status stable.  Continue carvedilol  3.125 mg twice a day.  Continue lisinopril 5 mg daily. Reviewed BMET from 12/31/13.  No spiro due to noncompliance (was not on HF meds for over 1 year).  Reinforced daily weights, low salt food choices, and medication compliance.  2. TIA- on aspirin and statin 3. Elevated Cholesterol- Per PCP - on fenofibrate and lipitor.  4. Current Smoker- Continues to smoke about 1PPD. He declines smoking cessation alternatives.  5. Medication Noncompliance- has been compliant for the last 3 months!  Follow up in 6 months  Anneka Studer NP-C  1:26 PM

## 2014-01-21 NOTE — Patient Instructions (Signed)
Follow up 6 months.  Do the following things EVERYDAY: 1) Weigh yourself in the morning before breakfast. Write it down and keep it in a log. 2) Take your medicines as prescribed 3) Eat low salt foods-Limit salt (sodium) to 2000 mg per day.  4) Stay as active as you can everyday 5) Limit all fluids for the day to less than 2 liters  

## 2014-01-28 ENCOUNTER — Encounter (HOSPITAL_COMMUNITY): Payer: Self-pay | Admitting: Emergency Medicine

## 2014-01-28 ENCOUNTER — Emergency Department (HOSPITAL_COMMUNITY)
Admission: EM | Admit: 2014-01-28 | Discharge: 2014-01-28 | Disposition: A | Discharge status: Home / Self Care | Payer: Medicare Other | Attending: Emergency Medicine | Admitting: Emergency Medicine

## 2014-01-28 DIAGNOSIS — I10 Essential (primary) hypertension: Secondary | ICD-10-CM | POA: Diagnosis not present

## 2014-01-28 DIAGNOSIS — E785 Hyperlipidemia, unspecified: Secondary | ICD-10-CM | POA: Insufficient documentation

## 2014-01-28 DIAGNOSIS — I5020 Unspecified systolic (congestive) heart failure: Secondary | ICD-10-CM | POA: Insufficient documentation

## 2014-01-28 DIAGNOSIS — E119 Type 2 diabetes mellitus without complications: Secondary | ICD-10-CM | POA: Insufficient documentation

## 2014-01-28 DIAGNOSIS — M1990 Unspecified osteoarthritis, unspecified site: Secondary | ICD-10-CM | POA: Diagnosis not present

## 2014-01-28 DIAGNOSIS — Z791 Long term (current) use of non-steroidal anti-inflammatories (NSAID): Secondary | ICD-10-CM | POA: Diagnosis not present

## 2014-01-28 DIAGNOSIS — Z7982 Long term (current) use of aspirin: Secondary | ICD-10-CM | POA: Insufficient documentation

## 2014-01-28 DIAGNOSIS — M79632 Pain in left forearm: Secondary | ICD-10-CM | POA: Diagnosis not present

## 2014-01-28 DIAGNOSIS — M109 Gout, unspecified: Secondary | ICD-10-CM | POA: Diagnosis not present

## 2014-01-28 DIAGNOSIS — Z9889 Other specified postprocedural states: Secondary | ICD-10-CM | POA: Insufficient documentation

## 2014-01-28 DIAGNOSIS — Z720 Tobacco use: Secondary | ICD-10-CM | POA: Insufficient documentation

## 2014-01-28 DIAGNOSIS — Z8719 Personal history of other diseases of the digestive system: Secondary | ICD-10-CM | POA: Insufficient documentation

## 2014-01-28 MED ORDER — OXYCODONE-ACETAMINOPHEN 5-325 MG PO TABS: 1.0000 | ORAL_TABLET | ORAL | Status: DC | PRN

## 2014-01-28 NOTE — Discharge Instructions (Signed)
Take the prescribed medication as directed. °Follow-up with your primary care physician on Tuesday as scheduled. °Return to the ED for new or worsening symptoms. °

## 2014-01-28 NOTE — ED Notes (Signed)
Per pt, states left forearm muscle pain-states he was doing work on car and that night be why it hurts

## 2014-01-28 NOTE — ED Provider Notes (Signed)
CSN: [id number]     Arrival date & time 01/28/14  1141 History   First MD Initiated Contact with Patient 01/28/14 1154     Chief Complaint  Patient presents with  . forearm pain      (Consider location/radiation/quality/duration/timing/severity/associated sxs/prior Treatment) The history is provided by the patient and medical records.   This is a 58 y.o. M with PMH significant for DM2, gout, osteoarthritis, anemia, CHF, presenting to the ED for left forearm pain. Patient states he was working on his car and thinks he may have strained his arm. He states his muscles are very sore, worse with making a fist and extending his fingers.  He states this feels different than his usual gout pain. He is currently still taking his colchicine.  He denies any arm swelling, redness, fever, or chills.  Patient has no prior history of DVT or PE.  Patient denies other complaints at this time.  Past Medical History  Diagnosis Date  . Systolic heart failure     EF 20-25% by echo 12/28/10  . Nonischemic cardiomyopathy     minimal coronary disease by cath 12/29/10  . Hemoptysis     secondary to pulmonary edema  . Hyperlipidemia   . Chronic anemia   . GI bleed     15 years ago  . Tobacco abuse   . Alcohol abuse   . Osteoarthritis   . Gout   . Diabetes mellitus, type 2     pt reports his DM is gone  . 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 Cardiac catheterization    . Hip arthroplasty Right 03/15/2013    Procedure: ARTHROPLASTY BIPOLAR HIP;  Surgeon: [name], MD;  Location: [facility];  Service: Orthopedics;  Laterality: Right;   Family History  Problem Relation Age of Onset  . Diabetes Mother   . Hypertension Mother   . Diabetes Father   . Hypertension Father   . Cancer Neg Hx   . Heart disease Neg Hx   . Stroke Neg Hx    History  Substance Use Topics  . Smoking status: Current Every Day Smoker -- 0.75 packs/day for 30 years   Types: Cigarettes  . Smokeless tobacco: Never Used  . Alcohol Use: 0.6 oz/week    1 Glasses of wine per week     Comment: occasionally    Review of Systems  Musculoskeletal: Positive for myalgias and arthralgias.  All other systems reviewed and are negative.     Allergies  Review of patient's allergies indicates no known allergies.  Home Medications   Prior to Admission medications   Medication Sig Start Date End Date Taking? Authorizing Provider  aspirin 325 MG EC tablet Take 325 mg by mouth daily.    Historical Provider, MD  atorvastatin (LIPITOR) 10 MG tablet Take 1 tablet (10 mg total) by mouth daily at 6 PM. 10/22/13   Amy D Clegg, NP  carvedilol (COREG) 3.125 MG tablet Take 1 tablet (3.125 mg total) by mouth 2 (two) times daily with a meal. 10/22/13   Amy D Clegg, NP  colchicine 0.6 MG tablet Take 1 tablet (0.6 mg total) by mouth 2 (two) times daily as needed. 12/30/13   [name], NP  cyclobenzaprine (FLEXERIL) 10 MG tablet Take 1 tablet (10 mg total) by mouth 3 (three) times daily as needed for muscle spasms. 12/31/13   [name], NP  diclofenac (VOLTAREN) 50 MG EC tablet Take  50 mg by mouth daily as needed for mild pain.  08/06/13   Historical Provider, MD  fenofibrate 54 MG tablet Take 1 tablet (54 mg total) by mouth daily. 10/22/13   Amy D Ninfa Meeker, NP  folic acid (FOLVITE) 1 MG tablet Take 1 tablet (1 mg total) by mouth daily. 10/22/13   Amy D Clegg, NP  lisinopril (ZESTRIL) 5 MG tablet Take 1 tablet (5 mg total) by mouth daily. 10/22/13   Amy D Ninfa Meeker, NP  Multiple Vitamin (ONE-A-DAY MENS PO) Take 1 tablet by mouth.    Historical Provider, MD  tetrahydrozoline 0.05 % ophthalmic solution Place 1 drop into both eyes daily as needed (Eye irritation).    Historical Provider, MD  thiamine 100 MG tablet Take 1 tablet (100 mg total) by mouth daily. 09/16/13   Thurnell Lose, MD   BP 151/92 mmHg  Pulse 112  Temp(Src) 97.7 F (36.5 C) (Oral)  Resp 18  SpO2 100%   Physical  Exam  Constitutional: He is oriented to person, place, and time. He appears well-developed and well-nourished. No distress.  HENT:  Head: Normocephalic and atraumatic.  Mouth/Throat: Oropharynx is clear and moist.  Eyes: Conjunctivae and EOM are normal. Pupils are equal, round, and reactive to light.  Neck: Normal range of motion. Neck supple.  Cardiovascular: Normal rate, regular rhythm and normal heart sounds.   Pulmonary/Chest: Effort normal and breath sounds normal. No respiratory distress. He has no wheezes.  Musculoskeletal: Normal range of motion.       Arms: Endorses soreness along extensor surface or left forearm; no focal tenderness; no bony deformities; no swelling, erythema, warmth to touch; no varicosities; normal grip strenth; strong radial pulse and cap refill; normal sensation throughout  Neurological: He is alert and oriented to person, place, and time.  Skin: Skin is warm and dry. He is not diaphoretic.  Psychiatric: He has a normal mood and affect.  Nursing note and vitals reviewed.   ED Course  Procedures (including critical care time) Labs Review Labs Reviewed - No data to display  Imaging Review No results found.   EKG Interpretation None      MDM   Final diagnoses:  Left forearm pain   58 year old male with left forearm pain after working on his car yesterday. No clinical signs of bony injury, infection, or DVT on exam. Pain was reproducible with extension of left fingers. Suspect extensor muscle strain.  Of note, patient mildly tachycardic-- likely due to pain.  Patient given a short supply of pain medicine until previously scheduled follow-up with his PCP on Tuesday.  Discussed plan with patient, he/she acknowledged understanding and agreed with plan of care.  Return precautions given for new or worsening symptoms.  Larene Pickett, PA-C 01/28/14 [place], MD 01/29/14 [phone]

## 2014-02-01 ENCOUNTER — Encounter: Payer: Self-pay | Admitting: Family Medicine

## 2014-02-01 ENCOUNTER — Ambulatory Visit (INDEPENDENT_AMBULATORY_CARE_PROVIDER_SITE_OTHER)
Admission: RE | Admit: 2014-02-01 | Discharge: 2014-02-01 | Disposition: A | Discharge status: Home / Self Care | Payer: Medicare Other | Source: Ambulatory Visit | Attending: Family Medicine | Admitting: Family Medicine

## 2014-02-01 ENCOUNTER — Ambulatory Visit (INDEPENDENT_AMBULATORY_CARE_PROVIDER_SITE_OTHER): Payer: Medicare Other | Admitting: Family Medicine

## 2014-02-01 VITALS — BP 118/80 | HR 103 | Temp 98.1°F | Ht 73.5 in | Wt 195.5 lb

## 2014-02-01 DIAGNOSIS — M25511 Pain in right shoulder: Secondary | ICD-10-CM

## 2014-02-01 DIAGNOSIS — M19011 Primary osteoarthritis, right shoulder: Secondary | ICD-10-CM

## 2014-02-01 MED ORDER — METHYLPREDNISOLONE ACETATE 40 MG/ML IJ SUSP
80.0000 mg | Freq: Once | INTRAMUSCULAR | Status: AC
  Administered 2014-02-01: 80 mg via INTRA_ARTICULAR

## 2014-02-01 NOTE — Progress Notes (Signed)
Pre visit review using our clinic review tool, if applicable. No additional management support is needed unless otherwise documented below in the visit note. 

## 2014-02-01 NOTE — Progress Notes (Signed)
Dr. Frederico Hamman T. Georgean Spainhower, MD, [facility] [place], [address] Phone: [phone] Fax: [phone]  02/01/2014  Patient: Travis [name], MRN: [id number], DOB: September 20, 1955, 58 y.o.  Primary Physician:  [name], NP  Chief Complaint: Shoulder Pain  Subjective:   Travis [name] is a 58 y.o. very pleasant male patient who presents with the following:  Shoulder pain.   R shoulder pain, did not really do anything to injure his right shoulder.  Started on Wednesday, not any better at all. Out of the blue, then sore and cannot raise arm at all.   The patient relates to me that this started last Wednesday.  He also notes to me that he went to the emergency room for his arm and shoulder, but on review of their notes, it seems as if they thought that he had a LEFT arm pain.  It is unclear if he had a different condition or if there is a documentation error.  The patient is not the best historian.   Nevertheless, the patient has significant shoulder pain is having difficulty moving it.  He is having remarkable pain and limitation in even basic flexion and abduction.  Pain with rotational movements as well.   Past Medical History, Surgical History, Social History, Family History, Problem List, Medications, and Allergies have been reviewed and updated if relevant.  GEN: No fevers, chills. Nontoxic. Primarily MSK c/o today. MSK: Detailed in the HPI GI: tolerating PO intake without difficulty Neuro: No numbness, parasthesias, or tingling associated. Otherwise the pertinent positives of the ROS are noted above.   Objective:   BP 118/80 mmHg  Pulse 103  Temp(Src) 98.1 F (36.7 C) (Oral)  Ht 6' 1.5" (1.867 m)  Wt 195 lb 8 oz (88.678 kg)  BMI 25.44 kg/m2   GEN: WDWN, NAD, Non-toxic, Alert & Oriented x 3 HEENT: Atraumatic, Normocephalic.  Ears and Nose: No external deformity. EXTR: No clubbing/cyanosis/edema NEURO: Normal gait.    PSYCH: Normally interactive. Conversant. Not depressed or anxious appearing.  Calm demeanor.    RIGHT shoulder: Nontender along the clavicle.  Mildly tender at the acromioclavicular joint.  Moderately tender in the bicipital groove.  Markedly limited in motion.  Abduction is limited to 85 actively.  Flexion to 85 actively.  Minimal rotational movement.  Strength testing is 5/5 in all directions.  After intra-articular injection of corticosteroid and anesthetic the patient had increased range of motion in abduction and flexion by approximately 25 or 30.  Radiology: Dg Shoulder Right  02/02/2014   CLINICAL DATA:  Right shoulder pain without trauma, initial encounter  EXAM: RIGHT SHOULDER - 2+ VIEW  COMPARISON:  None.  FINDINGS: Severe degenerative changes of the glenohumeral articulation are noted with subchondral sclerosis and cyst formation. Mild osteophytic changes are noted from the humeral head inferiorly. The underlying bony thorax is within normal limits.  IMPRESSION: Degenerative changes without acute abnormality.   Electronically Signed   By: Inez Catalina M.D.   On: 02/02/2014 07:43   The radiological images were independently reviewed by myself in the office and results were reviewed with the patient. My independent interpretation of images:  The patient appears to have end-stage glenohumeral arthritis.  Essentially no joint space remains.  There is no evidence for dislocation or occult fracture. Electronically Signed  By: Owens Loffler, MD On: 02/02/2014 11:14 AM   Assessment and Plan:   Glenohumeral arthritis, right  Right shoulder pain - Plan: DG Shoulder Right,  methylPREDNISolone acetate (DEPO-MEDROL) injection 80 mg  The patient has severe right-sided end-stage glenohumeral Arthritis.  I am doubtful that anything short of the total shoulder replacement on the RIGHT we will give this patient any long-term relief with his shoulder.  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  Challenging case.  If the patient gets to the point where he is ready to have or consider shoulder replacement, I would have the patient see Dr. Tamera Punt at [facility].   Medical comorbidities maybe his limiting factor.  Intrarticular Shoulder Injection, RIGHT Verbal consent was obtained from the patient. Risks including infection explained and contrasted with benefits and alternatives. Patient prepped with Chloraprep and Ethyl Chloride used for anesthesia. An intraarticular shoulder injection was performed using the posterior approach. The patient tolerated the procedure well and had decreased pain post injection. No complications. Injection: 8 cc of Lidocaine 1% and Depo-Medrol 80 mg. Needle: 22 gauge   Follow-up: No Follow-up on file.  New Prescriptions   No medications on file   Orders Placed This Encounter  Procedures  . DG Shoulder Right    Signed,  Frederico Hamman T. Britni Driscoll, MD   Patient's Medications  New Prescriptions   No medications on file  Previous Medications   ASPIRIN 325 MG EC TABLET    Take 325 mg by mouth daily.   ATORVASTATIN (LIPITOR) 10 MG TABLET    Take 1 tablet (10 mg total) by mouth daily at 6 PM.   CARVEDILOL (COREG) 3.125 MG TABLET    Take 1 tablet (3.125 mg total) by mouth 2 (two) times daily with a meal.   COLCHICINE 0.6 MG TABLET    Take 1 tablet (0.6 mg total) by mouth 2 (two) times daily as needed.   CYCLOBENZAPRINE (FLEXERIL) 10 MG TABLET    Take 1 tablet (10 mg total) by mouth 3 (three) times daily as needed for muscle spasms.   DICLOFENAC (VOLTAREN) 50 MG EC TABLET    Take 50 mg by mouth daily as needed for mild pain.    FENOFIBRATE 54 MG TABLET    Take 1 tablet (54 mg total) by mouth daily.   FOLIC ACID (FOLVITE) 1 MG TABLET    Take 1 tablet (1 mg total) by mouth daily.   LISINOPRIL (ZESTRIL) 5 MG TABLET    Take 1 tablet (5 mg  total) by mouth daily.   MULTIPLE VITAMIN (ONE-A-DAY MENS PO)    Take 1 tablet by mouth.   OXYCODONE-ACETAMINOPHEN (PERCOCET/ROXICET) 5-325 MG PER TABLET    Take 1 tablet by mouth every 4 (four) hours as needed.   TETRAHYDROZOLINE 0.05 % OPHTHALMIC SOLUTION    Place 1 drop into both eyes daily as needed (Eye irritation).   THIAMINE 100 MG TABLET    Take 1 tablet (100 mg total) by mouth daily.  Modified Medications   No medications on file  Discontinued Medications   No medications on file

## 2014-02-08 ENCOUNTER — Encounter: Payer: Self-pay | Admitting: Internal Medicine

## 2014-02-20 ENCOUNTER — Encounter (HOSPITAL_COMMUNITY): Payer: Self-pay | Admitting: Emergency Medicine

## 2014-02-20 ENCOUNTER — Emergency Department (HOSPITAL_COMMUNITY)
Admission: EM | Admit: 2014-02-20 | Discharge: 2014-02-20 | Disposition: A | Discharge status: Home / Self Care | Payer: Medicare Other | Attending: Emergency Medicine | Admitting: Emergency Medicine

## 2014-02-20 DIAGNOSIS — Z79899 Other long term (current) drug therapy: Secondary | ICD-10-CM | POA: Diagnosis not present

## 2014-02-20 DIAGNOSIS — Z720 Tobacco use: Secondary | ICD-10-CM | POA: Diagnosis not present

## 2014-02-20 DIAGNOSIS — D649 Anemia, unspecified: Secondary | ICD-10-CM | POA: Insufficient documentation

## 2014-02-20 DIAGNOSIS — R609 Edema, unspecified: Secondary | ICD-10-CM | POA: Insufficient documentation

## 2014-02-20 DIAGNOSIS — Z8719 Personal history of other diseases of the digestive system: Secondary | ICD-10-CM | POA: Insufficient documentation

## 2014-02-20 DIAGNOSIS — Z8673 Personal history of transient ischemic attack (TIA), and cerebral infarction without residual deficits: Secondary | ICD-10-CM | POA: Diagnosis not present

## 2014-02-20 DIAGNOSIS — M1990 Unspecified osteoarthritis, unspecified site: Secondary | ICD-10-CM | POA: Diagnosis not present

## 2014-02-20 DIAGNOSIS — I5020 Unspecified systolic (congestive) heart failure: Secondary | ICD-10-CM | POA: Diagnosis not present

## 2014-02-20 DIAGNOSIS — E785 Hyperlipidemia, unspecified: Secondary | ICD-10-CM | POA: Diagnosis not present

## 2014-02-20 DIAGNOSIS — Z9889 Other specified postprocedural states: Secondary | ICD-10-CM | POA: Insufficient documentation

## 2014-02-20 DIAGNOSIS — Z7982 Long term (current) use of aspirin: Secondary | ICD-10-CM | POA: Diagnosis not present

## 2014-02-20 DIAGNOSIS — M25561 Pain in right knee: Secondary | ICD-10-CM

## 2014-02-20 MED ORDER — OXYCODONE-ACETAMINOPHEN 5-325 MG PO TABS
1.0000 | ORAL_TABLET | Freq: Once | ORAL | Status: AC
  Administered 2014-02-20: 1 via ORAL
  Filled 2014-02-20: qty 1

## 2014-02-20 MED ORDER — COLCHICINE 0.6 MG PO TABS: 0.6000 mg | ORAL_TABLET | Freq: Two times a day (BID) | ORAL | Status: DC

## 2014-02-20 NOTE — ED Provider Notes (Signed)
CSN: [id number]     Arrival date & time 02/20/14  1518 History  This chart was scribed for non-physician practitioner, Monico Blitz, PA-C, working with [place], DO, by Delphia Grates, ED Scribe. This patient was seen in room WTR5/WTR5 and the patient's care was started at 4:55 PM.    Chief Complaint  Patient presents with  . Knee Pain  . Gout    The history is provided by the patient. No language interpreter was used.     HPI Comments: Travis [name] is a 58 y.o. male who presents to the Emergency Department complaining of constant, 9-10/10, right knee pain for that past 3 days. Patient states he had to travel to [place], [place] for a funeral and reports pain since. There is associated swelling the right knee Patient has history of gout that is typically in the knee and states this feels similar. He states he normally takes colchicine for treatment, but reports he has been out of this medication for the past 2 weeks. Patient denies any fall, injury, or trauma to the right knee. He also denies history of DM.   Past Medical History  Diagnosis Date  . Systolic heart failure     EF 20-25% by echo 12/28/10  . Nonischemic cardiomyopathy     minimal coronary disease by cath 12/29/10  . Hemoptysis     secondary to pulmonary edema  . Hyperlipidemia   . Chronic anemia   . GI bleed     15 years ago  . Tobacco abuse   . Alcohol abuse   . Osteoarthritis   . Gout   . Diabetes mellitus, type 2     pt reports his DM is gone  . 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 Cardiac catheterization    . Hip arthroplasty Right 03/15/2013    Procedure: ARTHROPLASTY BIPOLAR HIP;  Surgeon: [name], MD;  Location: [facility];  Service: Orthopedics;  Laterality: Right;   Family History  Problem Relation Age of Onset  . Diabetes Mother   . Hypertension Mother   . Diabetes Father   . Hypertension Father   . Cancer Neg Hx   . Heart  disease Neg Hx   . Stroke Neg Hx    History  Substance Use Topics  . Smoking status: Current Every Day Smoker -- 0.75 packs/day for 30 years    Types: Cigarettes  . Smokeless tobacco: Never Used  . Alcohol Use: 0.6 oz/week    1 Glasses of wine per week     Comment: occasionally    Review of Systems  A complete 10 system review of systems was obtained and all systems are negative except as noted in the HPI and PMH.    Allergies  Review of patient's allergies indicates no known allergies.  Home Medications   Prior to Admission medications   Medication Sig Start Date End Date Taking? Authorizing Provider  aspirin 325 MG EC tablet Take 325 mg by mouth daily.    Historical Provider, MD  atorvastatin (LIPITOR) 10 MG tablet Take 1 tablet (10 mg total) by mouth daily at 6 PM. 10/22/13   Amy D Clegg, NP  carvedilol (COREG) 3.125 MG tablet Take 1 tablet (3.125 mg total) by mouth 2 (two) times daily with a meal. 10/22/13   Amy D Clegg, NP  colchicine 0.6 MG tablet Take 1 tablet (0.6 mg total) by mouth 2 (two) times daily as needed. 12/30/13  [name], NP  colchicine 0.6 MG tablet Take 1 tablet (0.6 mg total) by mouth 2 (two) times daily. 02/20/14   Luvia Orzechowski, PA-C  cyclobenzaprine (FLEXERIL) 10 MG tablet Take 1 tablet (10 mg total) by mouth 3 (three) times daily as needed for muscle spasms. 12/31/13   [name], NP  diclofenac (VOLTAREN) 50 MG EC tablet Take 50 mg by mouth daily as needed for mild pain.  08/06/13   Historical Provider, MD  fenofibrate 54 MG tablet Take 1 tablet (54 mg total) by mouth daily. 10/22/13   Amy D Ninfa Meeker, NP  folic acid (FOLVITE) 1 MG tablet Take 1 tablet (1 mg total) by mouth daily. 10/22/13   Amy D Clegg, NP  lisinopril (ZESTRIL) 5 MG tablet Take 1 tablet (5 mg total) by mouth daily. 10/22/13   Amy D Ninfa Meeker, NP  Multiple Vitamin (ONE-A-DAY MENS PO) Take 1 tablet by mouth.    Historical Provider, MD  oxyCODONE-acetaminophen (PERCOCET/ROXICET) 5-325 MG per  tablet Take 1 tablet by mouth every 4 (four) hours as needed. 01/28/14   Larene Pickett, PA-C  tetrahydrozoline 0.05 % ophthalmic solution Place 1 drop into both eyes daily as needed (Eye irritation).    Historical Provider, MD  thiamine 100 MG tablet Take 1 tablet (100 mg total) by mouth daily. 09/16/13   Thurnell Lose, MD   Triage Vitals: BP 149/85 mmHg  Pulse 100  Temp(Src) 98.9 F (37.2 C) (Oral)  Resp 16  SpO2 100%  Physical Exam  Constitutional: He is oriented to person, place, and time. He appears well-developed and well-nourished. No distress.  HENT:  Head: Normocephalic and atraumatic.  Mouth/Throat: Oropharynx is clear and moist.  Eyes: Conjunctivae and EOM are normal.  Neck: Normal range of motion.  Cardiovascular: Normal rate, regular rhythm and intact distal pulses.   Pulmonary/Chest: Effort normal and breath sounds normal. No stridor. No respiratory distress. He has no wheezes. He has no rales. He exhibits no tenderness.  Abdominal: Soft. Bowel sounds are normal. He exhibits no distension and no mass. There is no tenderness. There is no rebound and no guarding.  Musculoskeletal: Normal range of motion. He exhibits edema and tenderness.  Right knee with moderate effusion, mildly reduced active range of motion, trace warmth. Distally neurovascularly intact. He is diffusely tender to palpation along the anterior side.  Neurological: He is alert and oriented to person, place, and time.  Psychiatric: He has a normal mood and affect.  Nursing note and vitals reviewed.   ED Course  Procedures (including critical care time)  DIAGNOSTIC STUDIES: Oxygen Saturation is 100% on room air, normal by my interpretation.    COORDINATION OF CARE: At 1700 Discussed treatment plan with patient which includes pain medication. Patient agrees.   Labs Review Labs Reviewed - No data to display  Imaging Review No results found.   EKG Interpretation None      MDM   Final  diagnoses:  Right knee pain    Filed Vitals:   02/20/14 1534  BP: 149/85  Pulse: 100  Temp: 98.9 F (37.2 C)  TempSrc: Oral  Resp: 16  SpO2: 100%    Medications  oxyCODONE-acetaminophen (PERCOCET/ROXICET) 5-325 MG per tablet 1 tablet (1 tablet Oral Given 02/20/14 1726)    Travis [name] is a 58 y.o. male presenting with atraumatic right knee pain and swelling consistent with prior episodes of gout. Patient has good active range of motion. I doubt septic joint. Will be given Percocet in  the ED and colchicine to go home with.  Evaluation does not show pathology that would require ongoing emergent intervention or inpatient treatment. Pt is hemodynamically stable and mentating appropriately. Discussed findings and plan with patient/guardian, who agrees with care plan. All questions answered. Return precautions discussed and outpatient follow up given.   New Prescriptions   COLCHICINE 0.6 MG TABLET    Take 1 tablet (0.6 mg total) by mouth 2 (two) times daily.     I personally performed the services described in this documentation, which was scribed in my presence. The recorded information has been reviewed and is accurate.    Monico Blitz, PA-C 02/20/14 [place], DO 02/20/14 2345

## 2014-02-20 NOTE — Discharge Instructions (Signed)
Please follow with your primary care doctor in the next 2 days for a check-up. They must obtain records for further management.  ° °Do not hesitate to return to the Emergency Department for any new, worsening or concerning symptoms.  ° °

## 2014-02-20 NOTE — ED Notes (Addendum)
Patient is out of colchicine and has been for 2 weeks. He was trying over the counter pain relievers with no relief.

## 2014-02-20 NOTE — ED Notes (Signed)
Pt c/o rt knee pain d/t gout flare x 2 days.

## 2014-02-23 ENCOUNTER — Ambulatory Visit (INDEPENDENT_AMBULATORY_CARE_PROVIDER_SITE_OTHER): Payer: Medicare Other | Admitting: Internal Medicine

## 2014-02-23 ENCOUNTER — Encounter: Payer: Self-pay | Admitting: Internal Medicine

## 2014-02-23 VITALS — BP 120/82 | HR 94 | Temp 97.6°F | Wt 189.0 lb

## 2014-02-23 DIAGNOSIS — M1711 Unilateral primary osteoarthritis, right knee: Secondary | ICD-10-CM | POA: Diagnosis not present

## 2014-02-23 MED ORDER — PREDNISONE 10 MG PO TABS: ORAL_TABLET | ORAL | Status: DC

## 2014-02-23 NOTE — Progress Notes (Signed)
Pre visit review using our clinic review tool, if applicable. No additional management support is needed unless otherwise documented below in the visit note. 

## 2014-02-23 NOTE — Progress Notes (Signed)
Subjective:    Patient ID: Travis [name], male    DOB: [birth year], 58 y.o.   MRN: [id number]  HPI  Pt presents to the clinic today with c/o swelling and pain of the right knee. He reports this started 5 days ago. He went to the ED 02/20/14 for the same. He was diagnosed with a gout flare (although no lab work or xray was done), given percocet and colcrys. He reports no relief with the treatment provided. He reports the pain is sharp at times. It is worse with movement. He denies numbness or tingling in the leg. He denies any recent injury to the area.  Review of Systems      Past Medical History  Diagnosis Date  . Systolic heart failure     EF 20-25% by echo 12/28/10  . Nonischemic cardiomyopathy     minimal coronary disease by cath 12/29/10  . Hemoptysis     secondary to pulmonary edema  . Hyperlipidemia   . Chronic anemia   . GI bleed     15 years ago  . Tobacco abuse   . Alcohol abuse   . Osteoarthritis   . Gout   . Diabetes mellitus, type 2     pt reports his DM is gone  . 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 aspirin 325 MG EC tablet Take 325 mg by mouth daily.    [name] atorvastatin (LIPITOR) 10 MG tablet Take 1 tablet (10 mg total) by mouth daily at 6 PM. 30 tablet 6  . carvedilol (COREG) 3.125 MG tablet Take 1 tablet (3.125 mg total) by mouth 2 (two) times daily with a meal. 60 tablet 6  . colchicine 0.6 MG tablet Take 1 tablet (0.6 mg total) by mouth 2 (two) times daily as needed. 60 tablet 0  . colchicine 0.6 MG tablet Take 1 tablet (0.6 mg total) by mouth 2 (two) times daily. 12 tablet 0  . cyclobenzaprine (FLEXERIL) 10 MG tablet Take 1 tablet (10 mg total) by mouth 3 (three) times daily as needed for muscle spasms. 30 tablet 0  . diclofenac (VOLTAREN) 50 MG EC tablet Take 50 mg by mouth daily as needed for mild pain.     . fenofibrate 54 MG tablet Take 1 tablet (54 mg total) by mouth daily. 30 tablet  6  . folic acid (FOLVITE) 1 MG tablet Take 1 tablet (1 mg total) by mouth daily. 30 tablet 6  . lisinopril (ZESTRIL) 5 MG tablet Take 1 tablet (5 mg total) by mouth daily. 30 tablet 6  . Multiple Vitamin (ONE-A-DAY MENS PO) Take 1 tablet by mouth.    . oxyCODONE-acetaminophen (PERCOCET/ROXICET) 5-325 MG per tablet Take 1 tablet by mouth every 4 (four) hours as needed. 10 tablet 0  . tetrahydrozoline 0.05 % ophthalmic solution Place 1 drop into both eyes daily as needed (Eye irritation).    . thiamine 100 MG tablet Take 1 tablet (100 mg total) by mouth daily. 30 tablet 0   No current facility-administered medications for this visit.    No Known Allergies  Family History  Problem Relation Age of Onset  . Diabetes Mother   . Hypertension Mother   . Diabetes Father   . Hypertension Father   . Cancer Neg Hx   . Heart disease Neg Hx   . Stroke Neg Hx     History   Social History  . Marital Status: Single  Spouse Name: N/A    Number of Children: N/A  . Years of Education: N/A   Occupational History  . Not on file.   Social History Main Topics  . Smoking status: Current Every Day Smoker -- 0.75 packs/day for 30 years    Types: Cigarettes  . Smokeless tobacco: Never Used  . Alcohol Use: 0.6 oz/week    1 Glasses of wine per week     Comment: occasionally  . Drug Use: No  . Sexual Activity: No   Other Topics Concern  . Not on file   Social History Narrative     Constitutional: Denies fever, malaise, fatigue, headache or abrupt weight changes.  Musculoskeletal: Pt reports right knee pain and swelling. Denies difficulty with gait, muscle pain.  Skin: Denies redness, rashes, lesions or ulcercations.    No other specific complaints in a complete review of systems (except as listed in HPI above).  Objective:   Physical Exam   BP 120/82 mmHg  Pulse 94  Temp(Src) 97.6 F (36.4 C) (Oral)  Wt 189 lb (85.73 kg)  SpO2 97% Wt Readings from Last 3 Encounters:  02/23/14  189 lb (85.73 kg)  02/01/14 195 lb 8 oz (88.678 kg)  01/21/14 189 lb 2 oz (85.787 kg)    General: Appears his stated age, in NAD. Skin: Right knee slightly warmer than left knee but no erythema noted. Cardiovascular: Normal rate and rhythm. S1,S2 noted. ? S3. No murmur, rubs or gallops noted. Pulmonary/Chest: Normal effort and positive vesicular breath sounds. No respiratory distress. No wheezes, rales or ronchi noted.  Musculoskeletal: Decreased flexion and extension of the knee due to pain. Crepitus noted with ROM. Medial swelling noted of the superior knee. Pain with palpation over the joint lines.  BMET    Component Value Date/Time   NA 138 12/31/2013 1403   NA 145* 02/25/2012 1409   K 4.0 12/31/2013 1403   CL 101 12/31/2013 1403   CO2 27 12/31/2013 1403   GLUCOSE 139* 12/31/2013 1403   GLUCOSE 87 02/25/2012 1409   BUN 12 12/31/2013 1403   BUN 11 02/25/2012 1409   CREATININE 1.0 12/31/2013 1403   CALCIUM 9.6 12/31/2013 1403   GFRNONAA 65* 10/29/2013 1122   GFRAA 76* 10/29/2013 1122    Lipid Panel     Component Value Date/Time   CHOL 153 12/31/2013 1403   TRIG 221.0* 12/31/2013 1403   HDL 28.70* 12/31/2013 1403   CHOLHDL 5 12/31/2013 1403   VLDL 44.2* 12/31/2013 1403   LDLCALC UNABLE TO CALCULATE IF TRIGLYCERIDE OVER 400 mg/dL 09/14/2013 0510    CBC    Component Value Date/Time   WBC 8.4 12/31/2013 1403   RBC 4.14* 12/31/2013 1403   RBC 2.93* 12/28/2010 0345   HGB 12.2* 12/31/2013 1403   HCT 37.7* 12/31/2013 1403   PLT 353.0 12/31/2013 1403   MCV 91.3 12/31/2013 1403   MCH 31.7 09/13/2013 1548   MCHC 32.4 12/31/2013 1403   RDW 15.1 12/31/2013 1403   LYMPHSABS 2.7 09/28/2013 1517   MONOABS 0.4 09/28/2013 1517   EOSABS 1.0* 09/28/2013 1517   BASOSABS 0.1 09/28/2013 1517    Hgb A1C Lab Results  Component Value Date   HGBA1C 6.6* 12/31/2013        Assessment & Plan:   OA of right knee:  Flare Xray of right knee 05/2012 reviewed- no need to repeat  at this time Stop Colcrys and start pred taper x 6 days No narcotics given due to previously failed  drug screen He has been referred to pain management He declines ortho referral  RTC as needed or if symptoms persist or worsen

## 2014-02-23 NOTE — Patient Instructions (Signed)
Arthritis, Nonspecific °Arthritis is inflammation of a joint. This usually means pain, redness, warmth or swelling are present. One or more joints may be involved. There are a number of types of arthritis. Your caregiver may not be able to tell what type of arthritis you have right away. °CAUSES  °The most common cause of arthritis is the wear and tear on the joint (osteoarthritis). This causes damage to the cartilage, which can break down over time. The knees, hips, back and neck are most often affected by this type of arthritis. °Other types of arthritis and common causes of joint pain include: °· Sprains and other injuries near the joint. Sometimes minor sprains and injuries cause pain and swelling that develop hours later. °· Rheumatoid arthritis. This affects hands, feet and knees. It usually affects both sides of your body at the same time. It is often associated with chronic ailments, fever, weight loss and general weakness. °· Crystal arthritis. Gout and pseudo gout can cause occasional acute severe pain, redness and swelling in the foot, ankle, or knee. °· Infectious arthritis. Bacteria can get into a joint through a break in overlying skin. This can cause infection of the joint. Bacteria and viruses can also spread through the blood and affect your joints. °· Drug, infectious and allergy reactions. Sometimes joints can become mildly painful and slightly swollen with these types of illnesses. °SYMPTOMS  °· Pain is the main symptom. °· Your joint or joints can also be red, swollen and warm or hot to the touch. °· You may have a fever with certain types of arthritis, or even feel overall ill. °· The joint with arthritis will hurt with movement. Stiffness is present with some types of arthritis. °DIAGNOSIS  °Your caregiver will suspect arthritis based on your description of your symptoms and on your exam. Testing may be needed to find the type of arthritis: °· Blood and sometimes urine tests. °· X-ray tests  and sometimes CT or MRI scans. °· Removal of fluid from the joint (arthrocentesis) is done to check for bacteria, crystals or other causes. Your caregiver (or a specialist) will numb the area over the joint with a local anesthetic, and use a needle to remove joint fluid for examination. This procedure is only minimally uncomfortable. °· Even with these tests, your caregiver may not be able to tell what kind of arthritis you have. Consultation with a specialist (rheumatologist) may be helpful. °TREATMENT  °Your caregiver will discuss with you treatment specific to your type of arthritis. If the specific type cannot be determined, then the following general recommendations may apply. °Treatment of severe joint pain includes: °· Rest. °· Elevation. °· Anti-inflammatory medication (for example, ibuprofen) may be prescribed. Avoiding activities that cause increased pain. °· Only take over-the-counter or prescription medicines for pain and discomfort as recommended by your caregiver. °· Cold packs over an inflamed joint may be used for 10 to 15 minutes every hour. Hot packs sometimes feel better, but do not use overnight. Do not use hot packs if you are diabetic without your caregiver's permission. °· A cortisone shot into arthritic joints may help reduce pain and swelling. °· Any acute arthritis that gets worse over the next 1 to 2 days needs to be looked at to be sure there is no joint infection. °Long-term arthritis treatment involves modifying activities and lifestyle to reduce joint stress jarring. This can include weight loss. Also, exercise is needed to nourish the joint cartilage and remove waste. This helps keep the muscles   around the joint strong. °HOME CARE INSTRUCTIONS  °· Do not take aspirin to relieve pain if gout is suspected. This elevates uric acid levels. °· Only take over-the-counter or prescription medicines for pain, discomfort or fever as directed by your caregiver. °· Rest the joint as much as  possible. °· If your joint is swollen, keep it elevated. °· Use crutches if the painful joint is in your leg. °· Drinking plenty of fluids may help for certain types of arthritis. °· Follow your caregiver's dietary instructions. °· Try low-impact exercise such as: °¨ Swimming. °¨ Water aerobics. °¨ Biking. °¨ Walking. °· Morning stiffness is often relieved by a warm shower. °· Put your joints through regular range-of-motion. °SEEK MEDICAL CARE IF:  °· You do not feel better in 24 hours or are getting worse. °· You have side effects to medications, or are not getting better with treatment. °SEEK IMMEDIATE MEDICAL CARE IF:  °· You have a fever. °· You develop severe joint pain, swelling or redness. °· Many joints are involved and become painful and swollen. °· There is severe back pain and/or leg weakness. °· You have loss of bowel or bladder control. °Document Released: 04/19/2004 Document Revised: 06/04/2011 Document Reviewed: 05/05/2008 °ExitCare® Patient Information ©2015 [employer]. This information is not intended to replace advice given to you by your health care provider. Make sure you discuss any questions you have with your health care provider. ° °

## 2014-03-12 ENCOUNTER — Emergency Department: Payer: Self-pay | Admitting: Emergency Medicine

## 2014-03-12 DIAGNOSIS — R000 Tachycardia, unspecified: Secondary | ICD-10-CM | POA: Diagnosis not present

## 2014-03-12 DIAGNOSIS — S46912A Strain of unspecified muscle, fascia and tendon at shoulder and upper arm level, left arm, initial encounter: Secondary | ICD-10-CM | POA: Diagnosis not present

## 2014-03-12 DIAGNOSIS — M25512 Pain in left shoulder: Secondary | ICD-10-CM | POA: Diagnosis not present

## 2014-03-12 DIAGNOSIS — E119 Type 2 diabetes mellitus without complications: Secondary | ICD-10-CM | POA: Diagnosis not present

## 2014-03-12 DIAGNOSIS — R0789 Other chest pain: Secondary | ICD-10-CM | POA: Diagnosis not present

## 2014-03-12 DIAGNOSIS — Z720 Tobacco use: Secondary | ICD-10-CM | POA: Diagnosis not present

## 2014-03-12 DIAGNOSIS — M19012 Primary osteoarthritis, left shoulder: Secondary | ICD-10-CM | POA: Diagnosis not present

## 2014-03-12 LAB — COMPREHENSIVE METABOLIC PANEL WITH GFR
Albumin: 3.4 g/dL (ref 3.4–5.0)
Alkaline Phosphatase: 118 U/L — ABNORMAL HIGH
Anion Gap: 7 (ref 7–16)
BUN: 10 mg/dL (ref 7–18)
Bilirubin,Total: 0.5 mg/dL (ref 0.2–1.0)
Calcium, Total: 9.1 mg/dL (ref 8.5–10.1)
Chloride: 103 mmol/L (ref 98–107)
Co2: 27 mmol/L (ref 21–32)
Creatinine: 1.16 mg/dL (ref 0.60–1.30)
EGFR (African American): >60
EGFR (Non-African Amer.): >60
Glucose: 122 mg/dL — ABNORMAL HIGH (ref 65–99)
Osmolality: 274 (ref 275–301)
Potassium: 4.1 mmol/L (ref 3.5–5.1)
SGOT(AST): 21 U/L (ref 15–37)
SGPT (ALT): 23 U/L
Sodium: 137 mmol/L (ref 136–145)
Total Protein: 7.3 g/dL (ref 6.4–8.2)

## 2014-03-12 LAB — CBC
HCT: 40.1 % (ref 40.0–52.0)
HGB: 13.2 g/dL (ref 13.0–18.0)
MCH: 29.4 pg (ref 26.0–34.0)
MCHC: 32.9 g/dL (ref 32.0–36.0)
MCV: 89 fL (ref 80–100)
PLATELETS: 370 10*3/uL (ref 150–440)
RBC: 4.49 10*6/uL (ref 4.40–5.90)
RDW: 15.9 % — ABNORMAL HIGH (ref 11.5–14.5)
WBC: 11.5 10*3/uL — ABNORMAL HIGH (ref 3.8–10.6)

## 2014-03-12 LAB — PROTIME-INR
INR: 1
Prothrombin Time: 13.5 secs (ref 11.5–14.7)

## 2014-03-12 LAB — APTT: Activated PTT: 32.6 secs (ref 23.6–35.9)

## 2014-03-12 LAB — CK TOTAL AND CKMB (NOT AT ARMC)
CK, Total: 70 U/L (ref 39–308)
CK-MB: 0.6 ng/mL (ref 0.5–3.6)

## 2014-03-12 LAB — TROPONIN I: Troponin-I: <0.02 ng/mL

## 2014-03-28 ENCOUNTER — Emergency Department: Payer: Self-pay | Admitting: Emergency Medicine

## 2014-03-28 DIAGNOSIS — M25512 Pain in left shoulder: Secondary | ICD-10-CM | POA: Diagnosis not present

## 2014-03-28 DIAGNOSIS — I10 Essential (primary) hypertension: Secondary | ICD-10-CM | POA: Diagnosis not present

## 2014-03-28 DIAGNOSIS — Z720 Tobacco use: Secondary | ICD-10-CM | POA: Diagnosis not present

## 2014-03-30 ENCOUNTER — Ambulatory Visit (INDEPENDENT_AMBULATORY_CARE_PROVIDER_SITE_OTHER): Payer: Medicare Other | Admitting: Internal Medicine

## 2014-03-30 ENCOUNTER — Encounter: Payer: Self-pay | Admitting: Internal Medicine

## 2014-03-30 VITALS — BP 126/82 | HR 83 | Temp 97.9°F | Wt 188.5 lb

## 2014-03-30 DIAGNOSIS — M129 Arthropathy, unspecified: Secondary | ICD-10-CM

## 2014-03-30 DIAGNOSIS — M25512 Pain in left shoulder: Secondary | ICD-10-CM | POA: Diagnosis not present

## 2014-03-30 DIAGNOSIS — M19012 Primary osteoarthritis, left shoulder: Secondary | ICD-10-CM

## 2014-03-30 NOTE — Progress Notes (Signed)
Subjective:    Patient ID: Travis [name], male    DOB: 07-Jan-1956, 59 y.o.   MRN: [id number]  HPI  Pt presents to the clinic today with c/o left shoulder pain. This has been a issue for him for at least the last 6 months. He was seen 09/2013 for the same. He does report he went to [facility] ED 2 weeks ago for the same. He was given prednisone, which seemed to provide relief but once he stopped the medication, the pain returned. He went back to the ED 4 days ago and was prescribed muscles relaxers and ibuprofen. He reports he had an xray done which showed arthritis in the left shoulder. He reports he has pain when he touches his shoulder or when he tries to lift his arm above his head. He denies weakness in the left arm. He denies numbness and tingling. He reports he has not been taking anything other that what he told me he was taking. Upon looking at the [facility] controlled substances registry, he has been getting oxycodone at least once per month from different prescribers.  Review of Systems      Past Medical History  Diagnosis Date  . Systolic heart failure     EF 20-25% by echo 12/28/10  . Nonischemic cardiomyopathy     minimal coronary disease by cath 12/29/10  . Hemoptysis     secondary to pulmonary edema  . Hyperlipidemia   . Chronic anemia   . GI bleed     15 years ago  . Tobacco abuse   . Alcohol abuse   . Osteoarthritis   . Gout   . Diabetes mellitus, type 2     pt reports his DM is gone  . 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 aspirin 325 MG EC tablet Take 325 mg by mouth daily.    [name] atorvastatin (LIPITOR) 10 MG tablet Take 1 tablet (10 mg total) by mouth daily at 6 PM. 30 tablet 6  . carvedilol (COREG) 3.125 MG tablet Take 1 tablet (3.125 mg total) by mouth 2 (two) times daily with a meal. 60 tablet 6  . cyclobenzaprine (FLEXERIL) 10 MG tablet Take 1 tablet (10 mg total) by mouth 3 (three) times daily as needed  for muscle spasms. 30 tablet 0  . diclofenac (VOLTAREN) 50 MG EC tablet Take 50 mg by mouth daily as needed for mild pain.     . fenofibrate 54 MG tablet Take 1 tablet (54 mg total) by mouth daily. 30 tablet 6  . folic acid (FOLVITE) 1 MG tablet Take 1 tablet (1 mg total) by mouth daily. 30 tablet 6  . lisinopril (ZESTRIL) 5 MG tablet Take 1 tablet (5 mg total) by mouth daily. 30 tablet 6  . Multiple Vitamin (ONE-A-DAY MENS PO) Take 1 tablet by mouth.    . thiamine 100 MG tablet Take 1 tablet (100 mg total) by mouth daily. 30 tablet 0  . colchicine 0.6 MG tablet Take 1 tablet (0.6 mg total) by mouth 2 (two) times daily. (Patient not taking: Reported on 03/30/2014) 12 tablet 0   No current facility-administered medications for this visit.    No Known Allergies  Family History  Problem Relation Age of Onset  . Diabetes Mother   . Hypertension Mother   . Diabetes Father   . Hypertension Father   . Cancer Neg Hx   . Heart disease Neg Hx   .  Stroke Neg Hx     History   Social History  . Marital Status: Single    Spouse Name: N/A    Number of Children: N/A  . Years of Education: N/A   Occupational History  . Not on file.   Social History Main Topics  . Smoking status: Current Every Day Smoker -- 0.75 packs/day for 30 years    Types: Cigarettes  . Smokeless tobacco: Never Used  . Alcohol Use: 0.6 oz/week    1 Glasses of wine per week     Comment: occasionally  . Drug Use: No  . Sexual Activity: No   Other Topics Concern  . Not on file   Social History Narrative     Constitutional: Denies fever, malaise, fatigue, headache or abrupt weight changes.  Musculoskeletal: Pt reports left shoulder pain. Denies difficulty with gait, muscle pain or jointswelling.  Skin: Denies redness, rashes, lesions or ulcercations.    No other specific complaints in a complete review of systems (except as listed in HPI above).  Objective:   Physical Exam  BP 126/82 mmHg  Pulse 83   Temp(Src) 97.9 F (36.6 C) (Oral)  Wt 188 lb 8 oz (85.503 kg)  SpO2 97% Wt Readings from Last 3 Encounters:  03/30/14 188 lb 8 oz (85.503 kg)  02/23/14 189 lb (85.73 kg)  02/01/14 195 lb 8 oz (88.678 kg)    General: Appears his stated age, chronically ill appearing in NAD. Skin: Warm, dry and intact.  Cardiovascular: Normal rate and rhythm. S1,S2 noted.  No murmur, rubs or gallops noted.  Pulmonary/Chest: Normal effort and positive vesicular breath sounds. No respiratory distress. No wheezes, rales or ronchi noted.  Musculoskeletal: Decrease internal and external rotation of bilateral shoulders. Severe pain with palpation of the left AC joint. No pain with palpation of the subacromial bursa. No pain with palpation of the biceps tendon. Strength 4/5 LUE, 5/5 RUE. Negative drop can test. Neurological: Alert and oriented. Sensation intact to BUE.  BMET    Component Value Date/Time   NA 138 12/31/2013 1403   NA 145* 02/25/2012 1409   K 4.0 12/31/2013 1403   CL 101 12/31/2013 1403   CO2 27 12/31/2013 1403   GLUCOSE 139* 12/31/2013 1403   GLUCOSE 87 02/25/2012 1409   BUN 12 12/31/2013 1403   BUN 11 02/25/2012 1409   CREATININE 1.0 12/31/2013 1403   CALCIUM 9.6 12/31/2013 1403   GFRNONAA 65* 10/29/2013 1122   GFRAA 76* 10/29/2013 1122    Lipid Panel     Component Value Date/Time   CHOL 153 12/31/2013 1403   TRIG 221.0* 12/31/2013 1403   HDL 28.70* 12/31/2013 1403   CHOLHDL 5 12/31/2013 1403   VLDL 44.2* 12/31/2013 1403   LDLCALC UNABLE TO CALCULATE IF TRIGLYCERIDE OVER 400 mg/dL 09/14/2013 0510    CBC    Component Value Date/Time   WBC 8.4 12/31/2013 1403   RBC 4.14* 12/31/2013 1403   RBC 2.93* 12/28/2010 0345   HGB 12.2* 12/31/2013 1403   HCT 37.7* 12/31/2013 1403   PLT 353.0 12/31/2013 1403   MCV 91.3 12/31/2013 1403   MCH 31.7 09/13/2013 1548   MCHC 32.4 12/31/2013 1403   RDW 15.1 12/31/2013 1403   LYMPHSABS 2.7 09/28/2013 1517   MONOABS 0.4 09/28/2013 1517    EOSABS 1.0* 09/28/2013 1517   BASOSABS 0.1 09/28/2013 1517    Hgb A1C Lab Results  Component Value Date   HGBA1C 6.6* 12/31/2013  Assessment & Plan:   Left shoulder pain:  Hospital notes/images were not available for review- will fax record request to [facility] He is already taking Diclofenac daily I will not give him Norco for pain- I gave it to him before and it was not in his system on his UDS I suggest referral to ortho- he declines and prefers to come back and see Dr. Lorelei Pont I made an referral for pain management but he was not seen due to having medicaid  Make an appt this week with Dr. Lorelei Pont, if no improvement, will refer to ortho

## 2014-03-30 NOTE — Patient Instructions (Signed)
Shoulder Pain The shoulder is the joint that connects your arms to your body. The bones that form the shoulder joint include the upper arm bone (humerus), the shoulder blade (scapula), and the collarbone (clavicle). The top of the humerus is shaped like a ball and fits into a rather flat socket on the scapula (glenoid cavity). A combination of muscles and strong, fibrous tissues that connect muscles to bones (tendons) support your shoulder joint and hold the ball in the socket. Small, fluid-filled sacs (bursae) are located in different areas of the joint. They act as cushions between the bones and the overlying soft tissues and help reduce friction between the gliding tendons and the bone as you move your arm. Your shoulder joint allows a wide range of motion in your arm. This range of motion allows you to do things like scratch your back or throw a ball. However, this range of motion also makes your shoulder more prone to pain from overuse and injury. Causes of shoulder pain can originate from both injury and overuse and usually can be grouped in the following four categories:  Redness, swelling, and pain (inflammation) of the tendon (tendinitis) or the bursae (bursitis).  Instability, such as a dislocation of the joint.  Inflammation of the joint (arthritis).  Broken bone (fracture). HOME CARE INSTRUCTIONS   Apply ice to the sore area.  Put ice in a plastic bag.  Place a towel between your skin and the bag.  Leave the ice on for 15-20 minutes, 3-4 times per day for the first 2 days, or as directed by your health care provider.  Stop using cold packs if they do not help with the pain.  If you have a shoulder sling or immobilizer, wear it as long as your caregiver instructs. Only remove it to shower or bathe. Move your arm as little as possible, but keep your hand moving to prevent swelling.  Squeeze a soft ball or foam pad as much as possible to help prevent swelling.  Only take  over-the-counter or prescription medicines for pain, discomfort, or fever as directed by your caregiver. SEEK MEDICAL CARE IF:   Your shoulder pain increases, or new pain develops in your arm, hand, or fingers.  Your hand or fingers become cold and numb.  Your pain is not relieved with medicines. SEEK IMMEDIATE MEDICAL CARE IF:   Your arm, hand, or fingers are numb or tingling.  Your arm, hand, or fingers are significantly swollen or turn white or blue. MAKE SURE YOU:   Understand these instructions.  Will watch your condition.  Will get help right away if you are not doing well or get worse. Document Released: 12/20/2004 Document Revised: 07/27/2013 Document Reviewed: 02/24/2011 ExitCare Patient Information 2015 [employer]. This information is not intended to replace advice given to you by your health care provider. Make sure you discuss any questions you have with your health care provider.  

## 2014-03-30 NOTE — Progress Notes (Signed)
Pre visit review using our clinic review tool, if applicable. No additional management support is needed unless otherwise documented below in the visit note. 

## 2014-03-31 ENCOUNTER — Ambulatory Visit (INDEPENDENT_AMBULATORY_CARE_PROVIDER_SITE_OTHER): Payer: Medicare Other | Admitting: Family Medicine

## 2014-03-31 ENCOUNTER — Encounter: Payer: Self-pay | Admitting: Family Medicine

## 2014-03-31 VITALS — BP 140/90 | HR 103 | Temp 98.5°F | Ht 73.5 in | Wt 191.0 lb

## 2014-03-31 DIAGNOSIS — M25512 Pain in left shoulder: Secondary | ICD-10-CM

## 2014-03-31 DIAGNOSIS — M7502 Adhesive capsulitis of left shoulder: Secondary | ICD-10-CM

## 2014-03-31 DIAGNOSIS — M542 Cervicalgia: Secondary | ICD-10-CM

## 2014-03-31 MED ORDER — METHYLPREDNISOLONE ACETATE 40 MG/ML IJ SUSP
80.0000 mg | Freq: Once | INTRAMUSCULAR | Status: AC
  Administered 2014-03-31: 80 mg via INTRA_ARTICULAR

## 2014-03-31 MED ORDER — ETODOLAC 500 MG PO TABS: 500.0000 mg | ORAL_TABLET | Freq: Two times a day (BID) | ORAL | Status: DC

## 2014-03-31 NOTE — Progress Notes (Signed)
Dr. Frederico Hamman T. Staley Budzinski, MD, [facility] [place], [address] Phone: [phone] Fax: [phone]  03/31/2014  Patient: Travis [name], MRN: [id number], DOB: Nov 10, 1955, 59 y.o.  Primary Physician:  [name], NP  Chief Complaint: Shoulder Pain  Subjective:   Travis [name] is a 59 y.o. very pleasant male patient who presents with the following: shoulder pain  The patient noted above presents with L shoulder pain that has been ongoing for about 2 months there is no history of trauma or accident. The patient Has neck pain, but denies radicular symptoms.  No numbness or tingling. Denies dislocation, subluxation, separation of the shoulder. The patient does complain of pain with flexion, abduction, and terminal motion.  Significant restriction of motion. he describes a deep ache around the shoulder, and sometimes it will wake the patient up at night. He also describes pain in the trapezius and paracervical musculature posteriorly  F/u L shoulder. Last seen 01/2014. RIGHT glenohumeral arthritis  Medications Tried: ibuprofen, narcotics Ice or Heat: minimal help Tried PT: No  Prior shoulder Injury: No Prior surgery: No Prior fracture: No  Also discussed with him that it appears like he has been getting narcotics from multiple providers.  There were 9 providers who have given him narcotic medication in the last 12 months.  He denied having a problem with narcotics, and offered help if he did.  Our office is no longer going to offer this patient controlled substances.  Past Medical History, Surgical History, Social History, Family History, Medications, and allergies reviewed and updated if relevant.   GEN: No fevers, chills. Nontoxic. Primarily MSK c/o today. MSK: Detailed in the HPI GI: tolerating PO intake without difficulty Neuro: No numbness, parasthesias, or tingling associated. Otherwise the pertinent positives of the  ROS are noted above.    Objective:   Blood pressure 140/90, pulse 103, temperature 98.5 F (36.9 C), temperature source Oral, height 6' 1.5" (1.867 m), weight 191 lb (86.637 kg).  GEN: Well-developed,well-nourished,in no acute distress; alert,appropriate and cooperative throughout examination HEENT: Normocephalic and atraumatic without obvious abnormalities. Ears, externally no deformities PULM: Breathing comfortably in no respiratory distress EXT: No clubbing, cyanosis, or edema PSYCH: Normally interactive. Cooperative during the interview. Pleasant. Friendly and conversant. Not anxious or depressed appearing. Normal, full affect.  Shoulder: L Inspection: No muscle wasting or winging Ecchymosis/edema: neg  AC joint, scapula, clavicle: NT Cervical spine: diffuse posterior muscular tightness and pain, 10-15% loss of motion Spurling's: neg Abduction: 5/5, 120 Flexion: 5/5, 125 IR, full, lift-off: 5/5, minimal ER at neutral: 5/5, 75 AC crossover and compression: unable to complete Additional special testing is equivocal given lack of motion C5-T1 intact Sensation intact Grip 5/5  Unable to independently review the patient's LEFT shoulder films.  Report notes no significant arthritis.  Assessment and Plan:   Adhesive capsulitis, left  Left shoulder pain - Plan: methylPREDNISolone acetate (DEPO-MEDROL) injection 80 mg  Cervicalgia  >25 minutes spent in face to face time with patient, >50% spent in counselling or coordination of care   Diabetes likely a factor.  Preceding neck pain could make this secondary.  Patient was given a systematic ROM protocol from [employer] to be done daily. Emphasized importance of adherence, help of PT, daily HEP.  The average length of total symptoms is 12 months going through 3 different phases in the freezing and thawing process. Reviewed all with patient.   Tylenol or NSAID of choice prn for pain relief -  change to lodine Intraarticular  shoulder injections discussed with patient, which have good evidence for accelerating the thawing phase.  Patient will be sent for formal PT for aggressive frozen shoulder ROM - patient unable to do this. Will need RTC str and scapular stabilization to fix underlying mechanics.  Intrarticular Shoulder Injection, LEFT Verbal consent was obtained from the patient. Risks including infection explained and contrasted with benefits and alternatives. Patient prepped with Chloraprep and Ethyl Chloride used for anesthesia. An intraarticular shoulder injection was performed using the posterior approach. The patient tolerated the procedure well and had decreased pain post injection. No complications. Injection: 8 cc of Lidocaine 1% and Depo-Medrol 80 mg. Needle: 22 gauge   Follow-up: Return in about 10 weeks (around 06/09/2014).  New Prescriptions   ETODOLAC (LODINE) 500 MG TABLET    Take 1 tablet (500 mg total) by mouth 2 (two) times daily.   No orders of the defined types were placed in this encounter.    Signed,  Maud Deed. Amaranta Mehl, MD   Patient's Medications  New Prescriptions   ETODOLAC (LODINE) 500 MG TABLET    Take 1 tablet (500 mg total) by mouth 2 (two) times daily.  Previous Medications   ASPIRIN 325 MG EC TABLET    Take 325 mg by mouth daily.   ATORVASTATIN (LIPITOR) 10 MG TABLET    Take 1 tablet (10 mg total) by mouth daily at 6 PM.   CARVEDILOL (COREG) 3.125 MG TABLET    Take 1 tablet (3.125 mg total) by mouth 2 (two) times daily with a meal.   COLCHICINE 0.6 MG TABLET    Take 1 tablet (0.6 mg total) by mouth 2 (two) times daily.   CYCLOBENZAPRINE (FLEXERIL) 10 MG TABLET    Take 1 tablet (10 mg total) by mouth 3 (three) times daily as needed for muscle spasms.   FENOFIBRATE 54 MG TABLET    Take 1 tablet (54 mg total) by mouth daily.   FOLIC ACID (FOLVITE) 1 MG TABLET    Take 1 tablet (1 mg total) by mouth daily.   LISINOPRIL (ZESTRIL) 5 MG TABLET    Take 1 tablet (5 mg total) by  mouth daily.   MULTIPLE VITAMIN (ONE-A-DAY MENS PO)    Take 1 tablet by mouth.   THIAMINE 100 MG TABLET    Take 1 tablet (100 mg total) by mouth daily.  Modified Medications   No medications on file  Discontinued Medications   DICLOFENAC (VOLTAREN) 50 MG EC TABLET    Take 50 mg by mouth daily as needed for mild pain.

## 2014-03-31 NOTE — Progress Notes (Signed)
Pre visit review using our clinic review tool, if applicable. No additional management support is needed unless otherwise documented below in the visit note. 

## 2014-04-06 ENCOUNTER — Ambulatory Visit (INDEPENDENT_AMBULATORY_CARE_PROVIDER_SITE_OTHER): Payer: Medicare Other | Admitting: *Deleted

## 2014-04-06 ENCOUNTER — Encounter: Payer: Self-pay | Admitting: Internal Medicine

## 2014-04-06 ENCOUNTER — Ambulatory Visit: Payer: Medicare Other

## 2014-04-06 DIAGNOSIS — Z23 Encounter for immunization: Secondary | ICD-10-CM

## 2014-04-20 ENCOUNTER — Emergency Department: Payer: Self-pay | Admitting: Emergency Medicine

## 2014-04-20 DIAGNOSIS — Z79899 Other long term (current) drug therapy: Secondary | ICD-10-CM | POA: Diagnosis not present

## 2014-04-20 DIAGNOSIS — R109 Unspecified abdominal pain: Secondary | ICD-10-CM | POA: Diagnosis not present

## 2014-04-20 DIAGNOSIS — I10 Essential (primary) hypertension: Secondary | ICD-10-CM | POA: Diagnosis not present

## 2014-04-20 DIAGNOSIS — R197 Diarrhea, unspecified: Secondary | ICD-10-CM | POA: Diagnosis not present

## 2014-04-20 DIAGNOSIS — Z720 Tobacco use: Secondary | ICD-10-CM | POA: Diagnosis not present

## 2014-04-20 LAB — COMPREHENSIVE METABOLIC PANEL
ALBUMIN: 3.4 g/dL (ref 3.4–5.0)
ANION GAP: 5 — AB (ref 7–16)
Alkaline Phosphatase: 97 U/L (ref 46–116)
BUN: 14 mg/dL (ref 7–18)
Bilirubin,Total: 0.6 mg/dL (ref 0.2–1.0)
CALCIUM: 9.3 mg/dL (ref 8.5–10.1)
CHLORIDE: 106 mmol/L (ref 98–107)
CO2: 30 mmol/L (ref 21–32)
Creatinine: 1.18 mg/dL (ref 0.60–1.30)
Glucose: 102 mg/dL — ABNORMAL HIGH (ref 65–99)
OSMOLALITY: 282 (ref 275–301)
POTASSIUM: 3.7 mmol/L (ref 3.5–5.1)
SGOT(AST): 21 U/L (ref 15–37)
SGPT (ALT): 20 U/L (ref 14–63)
SODIUM: 141 mmol/L (ref 136–145)
Total Protein: 7.4 g/dL (ref 6.4–8.2)

## 2014-04-20 LAB — CBC WITH DIFFERENTIAL/PLATELET
BASOS PCT: 0.7 %
Basophil #: 0.1 10*3/uL (ref 0.0–0.1)
Eosinophil #: 0.3 10*3/uL (ref 0.0–0.7)
Eosinophil %: 4.1 %
HCT: 41.8 % (ref 40.0–52.0)
HGB: 13.5 g/dL (ref 13.0–18.0)
LYMPHS PCT: 27 %
Lymphocyte #: 2.1 10*3/uL (ref 1.0–3.6)
MCH: 29.1 pg (ref 26.0–34.0)
MCHC: 32.4 g/dL (ref 32.0–36.0)
MCV: 90 fL (ref 80–100)
MONO ABS: 0.5 x10 3/mm (ref 0.2–1.0)
Monocyte %: 6.8 %
Neutrophil #: 4.8 10*3/uL (ref 1.4–6.5)
Neutrophil %: 61.4 %
Platelet: 348 10*3/uL (ref 150–440)
RBC: 4.65 10*6/uL (ref 4.40–5.90)
RDW: 16.2 % — ABNORMAL HIGH (ref 11.5–14.5)
WBC: 7.9 10*3/uL (ref 3.8–10.6)

## 2014-04-20 LAB — LIPASE, BLOOD: LIPASE: 112 U/L (ref 73–393)

## 2014-04-20 LAB — TROPONIN I

## 2014-04-20 LAB — CLOSTRIDIUM DIFFICILE(ARMC)

## 2014-04-26 ENCOUNTER — Encounter (HOSPITAL_COMMUNITY): Payer: Self-pay | Admitting: *Deleted

## 2014-04-26 ENCOUNTER — Emergency Department (HOSPITAL_COMMUNITY)
Admission: EM | Admit: 2014-04-26 | Discharge: 2014-04-26 | Disposition: A | Discharge status: Home / Self Care | Payer: Medicare Other | Attending: Emergency Medicine | Admitting: Emergency Medicine

## 2014-04-26 ENCOUNTER — Emergency Department (HOSPITAL_COMMUNITY): Payer: Medicare Other

## 2014-04-26 DIAGNOSIS — K529 Noninfective gastroenteritis and colitis, unspecified: Secondary | ICD-10-CM | POA: Diagnosis not present

## 2014-04-26 DIAGNOSIS — Z720 Tobacco use: Secondary | ICD-10-CM | POA: Insufficient documentation

## 2014-04-26 DIAGNOSIS — E119 Type 2 diabetes mellitus without complications: Secondary | ICD-10-CM | POA: Diagnosis not present

## 2014-04-26 DIAGNOSIS — Z791 Long term (current) use of non-steroidal anti-inflammatories (NSAID): Secondary | ICD-10-CM | POA: Diagnosis not present

## 2014-04-26 DIAGNOSIS — N200 Calculus of kidney: Secondary | ICD-10-CM | POA: Diagnosis not present

## 2014-04-26 DIAGNOSIS — Z862 Personal history of diseases of the blood and blood-forming organs and certain disorders involving the immune mechanism: Secondary | ICD-10-CM | POA: Insufficient documentation

## 2014-04-26 DIAGNOSIS — R197 Diarrhea, unspecified: Secondary | ICD-10-CM | POA: Diagnosis present

## 2014-04-26 DIAGNOSIS — I5020 Unspecified systolic (congestive) heart failure: Secondary | ICD-10-CM | POA: Insufficient documentation

## 2014-04-26 DIAGNOSIS — Z7982 Long term (current) use of aspirin: Secondary | ICD-10-CM | POA: Diagnosis not present

## 2014-04-26 DIAGNOSIS — M109 Gout, unspecified: Secondary | ICD-10-CM | POA: Insufficient documentation

## 2014-04-26 DIAGNOSIS — M1990 Unspecified osteoarthritis, unspecified site: Secondary | ICD-10-CM | POA: Diagnosis not present

## 2014-04-26 DIAGNOSIS — Z79899 Other long term (current) drug therapy: Secondary | ICD-10-CM | POA: Insufficient documentation

## 2014-04-26 DIAGNOSIS — N202 Calculus of kidney with calculus of ureter: Secondary | ICD-10-CM | POA: Diagnosis not present

## 2014-04-26 DIAGNOSIS — Z8673 Personal history of transient ischemic attack (TIA), and cerebral infarction without residual deficits: Secondary | ICD-10-CM | POA: Diagnosis not present

## 2014-04-26 DIAGNOSIS — E785 Hyperlipidemia, unspecified: Secondary | ICD-10-CM | POA: Insufficient documentation

## 2014-04-26 DIAGNOSIS — R52 Pain, unspecified: Secondary | ICD-10-CM

## 2014-04-26 LAB — CBC WITH DIFFERENTIAL/PLATELET
BASOS PCT: 1 % (ref 0–1)
Basophils Absolute: 0 10*3/uL (ref 0.0–0.1)
EOS ABS: 0.8 10*3/uL — AB (ref 0.0–0.7)
Eosinophils Relative: 14 % — ABNORMAL HIGH (ref 0–5)
HCT: 36.4 % — ABNORMAL LOW (ref 39.0–52.0)
Hemoglobin: 12.2 g/dL — ABNORMAL LOW (ref 13.0–17.0)
LYMPHS ABS: 2.2 10*3/uL (ref 0.7–4.0)
Lymphocytes Relative: 38 % (ref 12–46)
MCH: 30 pg (ref 26.0–34.0)
MCHC: 33.5 g/dL (ref 30.0–36.0)
MCV: 89.7 fL (ref 78.0–100.0)
Monocytes Absolute: 0.5 10*3/uL (ref 0.1–1.0)
Monocytes Relative: 9 % (ref 3–12)
Neutro Abs: 2.3 10*3/uL (ref 1.7–7.7)
Neutrophils Relative %: 38 % — ABNORMAL LOW (ref 43–77)
PLATELETS: 333 10*3/uL (ref 150–400)
RBC: 4.06 MIL/uL — ABNORMAL LOW (ref 4.22–5.81)
RDW: 15 % (ref 11.5–15.5)
WBC: 5.8 10*3/uL (ref 4.0–10.5)

## 2014-04-26 LAB — COMPREHENSIVE METABOLIC PANEL
ALBUMIN: 3.1 g/dL — AB (ref 3.5–5.2)
ALT: 15 U/L (ref 0–53)
AST: 21 U/L (ref 0–37)
Alkaline Phosphatase: 72 U/L (ref 39–117)
Anion gap: 7 (ref 5–15)
BUN: 15 mg/dL (ref 6–23)
CO2: 29 mmol/L (ref 19–32)
Calcium: 9.2 mg/dL (ref 8.4–10.5)
Chloride: 105 mmol/L (ref 96–112)
Creatinine, Ser: 2.14 mg/dL — ABNORMAL HIGH (ref 0.50–1.35)
GFR, EST AFRICAN AMERICAN: 37 mL/min — AB (ref >90)
GFR, EST NON AFRICAN AMERICAN: 32 mL/min — AB (ref >90)
GLUCOSE: 89 mg/dL (ref 70–99)
POTASSIUM: 3.6 mmol/L (ref 3.5–5.1)
Sodium: 141 mmol/L (ref 135–145)
TOTAL PROTEIN: 6.2 g/dL (ref 6.0–8.3)
Total Bilirubin: 0.4 mg/dL (ref 0.3–1.2)

## 2014-04-26 LAB — LIPASE, BLOOD: LIPASE: 24 U/L (ref 11–59)

## 2014-04-26 MED ORDER — IOHEXOL 300 MG/ML  SOLN: 25.0000 mL | Freq: Once | INTRAMUSCULAR | Status: DC | PRN

## 2014-04-26 MED ORDER — HYDROMORPHONE HCL 1 MG/ML IJ SOLN
1.0000 mg | Freq: Once | INTRAMUSCULAR | Status: AC
  Administered 2014-04-26: 1 mg via INTRAVENOUS
  Filled 2014-04-26: qty 1

## 2014-04-26 MED ORDER — LOPERAMIDE HCL 2 MG PO CAPS: 2.0000 mg | ORAL_CAPSULE | Freq: Four times a day (QID) | ORAL | Status: DC | PRN

## 2014-04-26 MED ORDER — ONDANSETRON 4 MG PO TBDP: 4.0000 mg | ORAL_TABLET | Freq: Three times a day (TID) | ORAL | Status: DC | PRN

## 2014-04-26 MED ORDER — OXYCODONE-ACETAMINOPHEN 5-325 MG PO TABS: 1.0000 | ORAL_TABLET | ORAL | Status: DC | PRN

## 2014-04-26 MED ORDER — SODIUM CHLORIDE 0.9 % IV BOLUS (SEPSIS)
1000.0000 mL | Freq: Once | INTRAVENOUS | Status: AC
  Administered 2014-04-26: 1000 mL via INTRAVENOUS

## 2014-04-26 NOTE — ED Notes (Signed)
NAD at this time. Pt is stable and going home.  

## 2014-04-26 NOTE — ED Notes (Signed)
Pt is in CT

## 2014-04-26 NOTE — Discharge Instructions (Signed)
Please follow up with your primary care physician in 1-2 days. If you do not have one please call the [place] number listed above. Please follow up with Dr. Jeffie Pollock to schedule a follow up appointment.  Please take pain medication and/or muscle relaxants as prescribed and as needed for pain. Please do not drive on narcotic pain medication or on muscle relaxants. Please read all discharge instructions and return precautions.   Kidney Stones Kidney stones (urolithiasis) are deposits that form inside your kidneys. The intense pain is caused by the stone moving through the urinary tract. When the stone moves, the ureter goes into spasm around the stone. The stone is usually passed in the urine.  CAUSES   A disorder that makes certain neck glands produce too much parathyroid hormone (primary hyperparathyroidism).  A buildup of uric acid crystals, similar to gout in your joints.  Narrowing (stricture) of the ureter.  A kidney obstruction present at birth (congenital obstruction).  Previous surgery on the kidney or ureters.  Numerous kidney infections. SYMPTOMS   Feeling sick to your stomach (nauseous).  Throwing up (vomiting).  Blood in the urine (hematuria).  Pain that usually spreads (radiates) to the groin.  Frequency or urgency of urination. DIAGNOSIS   Taking a history and physical exam.  Blood or urine tests.  CT scan.  Occasionally, an examination of the inside of the urinary bladder (cystoscopy) is performed. TREATMENT   Observation.  Increasing your fluid intake.  Extracorporeal shock wave lithotripsy--This is a noninvasive procedure that uses shock waves to break up kidney stones.  Surgery may be needed if you have severe pain or persistent obstruction. There are various surgical procedures. Most of the procedures are performed with the use of small instruments. Only small incisions are needed to accommodate these instruments, so recovery time  is minimized. The size, location, and chemical composition are all important variables that will determine the proper choice of action for you. Talk to your health care provider to better understand your situation so that you will minimize the risk of injury to yourself and your kidney.  HOME CARE INSTRUCTIONS   Drink enough water and fluids to keep your urine clear or pale yellow. This will help you to pass the stone or stone fragments.  Strain all urine through the provided strainer. Keep all particulate matter and stones for your health care provider to see. The stone causing the pain may be as small as a grain of salt. It is very important to use the strainer each and every time you pass your urine. The collection of your stone will allow your health care provider to analyze it and verify that a stone has actually passed. The stone analysis will often identify what you can do to reduce the incidence of recurrences.  Only take over-the-counter or prescription medicines for pain, discomfort, or fever as directed by your health care provider.  Make a follow-up appointment with your health care provider as directed.  Get follow-up X-rays if required. The absence of pain does not always mean that the stone has passed. It may have only stopped moving. If the urine remains completely obstructed, it can cause loss of kidney function or even complete destruction of the kidney. It is your responsibility to make sure X-rays and follow-ups are completed. Ultrasounds of the kidney can show blockages and the status of the kidney. Ultrasounds are not associated with any radiation and can be performed easily in a matter of minutes. SEEK  MEDICAL CARE IF:  You experience pain that is progressive and unresponsive to any pain medicine you have been prescribed. SEEK IMMEDIATE MEDICAL CARE IF:   Pain cannot be controlled with the prescribed medicine.  You have a fever or shaking chills.  The severity or  intensity of pain increases over 18 hours and is not relieved by pain medicine.  You develop a new onset of abdominal pain.  You feel faint or pass out.  You are unable to urinate. MAKE SURE YOU:   Understand these instructions.  Will watch your condition.  Will get help right away if you are not doing well or get worse. Document Released: 03/12/2005 Document Revised: 11/12/2012 Document Reviewed: 08/13/2012 [facility] Patient Information 2015 [place], [place]. This information is not intended to replace advice given to you by your health care provider. Make sure you discuss any questions you have with your health care provider. Colitis Colitis is inflammation of the colon. Colitis can be a short-term or long-standing (chronic) illness. Crohn's disease and ulcerative colitis are 2 types of colitis which are chronic. They usually require lifelong treatment. CAUSES  There are many different causes of colitis, including:  Viruses.  Germs (bacteria).  Medicine reactions. SYMPTOMS   Diarrhea.  Intestinal bleeding.  Pain.  Fever.  Throwing up (vomiting).  Tiredness (fatigue).  Weight loss.  Bowel blockage. DIAGNOSIS  The diagnosis of colitis is based on examination and stool or blood tests. X-rays, CT scan, and colonoscopy may also be needed. TREATMENT  Treatment may include:  Fluids given through the vein (intravenously).  Bowel rest (nothing to eat or drink for a period of time).  Medicine for pain and diarrhea.  Medicines (antibiotics) that kill germs.  Cortisone medicines.  Surgery. HOME CARE INSTRUCTIONS   Get plenty of rest.  Drink enough water and fluids to keep your urine clear or pale yellow.  Eat a well-balanced diet.  Call your caregiver for follow-up as recommended. SEEK IMMEDIATE MEDICAL CARE IF:   You develop chills.  You have an oral temperature above 102 F (38.9 C), not controlled by medicine.  You have extreme weakness, fainting, or  dehydration.  You have repeated vomiting.  You develop severe belly (abdominal) pain or are passing bloody or tarry stools. MAKE SURE YOU:   Understand these instructions.  Will watch your condition.  Will get help right away if you are not doing well or get worse. Document Released: 04/19/2004 Document Revised: 06/04/2011 Document Reviewed: 07/15/2009 [facility] Patient Information 2015 [place], [place]. This information is not intended to replace advice given to you by your health care provider. Make sure you discuss any questions you have with your health care provider.

## 2014-04-26 NOTE — ED Notes (Signed)
Pt.is getting undress at this time .

## 2014-04-26 NOTE — ED Notes (Signed)
Pt has been expierencing diarrhea for 2 weeks. Came to the ED to get it under control.

## 2014-04-26 NOTE — ED Provider Notes (Signed)
CSN: [id number]     Arrival date & time 04/26/14  1049 History   First MD Initiated Contact with Patient 04/26/14 1106     Chief Complaint  Patient presents with  . Diarrhea     (Consider location/radiation/quality/duration/timing/severity/associated sxs/prior Treatment) HPI Comments: Patient is a 59 year old male past medical history significant for CHF, cardiomyopathy, hyperlipidemia, anemia, tobacco and alcohol abuse, DM presenting to the emergency department for 2 weeks of nonbloody diarrhea with associated generalized abdominal cramping. He states last evening he developed some right upper quadrant/right sided abdominal pain different from previous belly pain. States he was seen at [facility] emergency department and told to drink lots of fluids, has not noticed any improvement. Denies any abdominal surgical history. No recent travel, antibiotic use or hospitalization.   Past Medical History  Diagnosis Date  . Systolic heart failure     EF 20-25% by echo 12/28/10  . Nonischemic cardiomyopathy     minimal coronary disease by cath 12/29/10  . Hemoptysis     secondary to pulmonary edema  . Hyperlipidemia   . Chronic anemia   . GI bleed     15 years ago  . Tobacco abuse   . Alcohol abuse   . Osteoarthritis   . Gout   . Diabetes mellitus, type 2     pt reports his DM is gone  . 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 Cardiac catheterization    . Hip arthroplasty Right 03/15/2013    Procedure: ARTHROPLASTY BIPOLAR HIP;  Surgeon: [name], MD;  Location: [place];  Service: Orthopedics;  Laterality: Right;   Family History  Problem Relation Age of Onset  . Diabetes Mother   . Hypertension Mother   . Diabetes Father   . Hypertension Father   . Cancer Neg Hx   . Heart disease Neg Hx   . Stroke Neg Hx    History  Substance Use Topics  . Smoking status: Current Every Day Smoker -- 0.75 packs/day for 30 years   Types: Cigarettes  . Smokeless tobacco: Never Used  . Alcohol Use: 0.6 oz/week    1 Glasses of wine per week     Comment: occasionally    Review of Systems  Gastrointestinal: Positive for nausea, abdominal pain and diarrhea. Negative for vomiting.  All other systems reviewed and are negative.     Allergies  Review of patient's allergies indicates no known allergies.  Home Medications   Prior to Admission medications   Medication Sig Start Date End Date Taking? Authorizing Provider  aspirin 325 MG EC tablet Take 325 mg by mouth daily.   Yes Historical Provider, MD  atorvastatin (LIPITOR) 10 MG tablet Take 1 tablet (10 mg total) by mouth daily at 6 PM. 10/22/13  Yes Amy D Clegg, NP  carvedilol (COREG) 3.125 MG tablet Take 1 tablet (3.125 mg total) by mouth 2 (two) times daily with a meal. 10/22/13  Yes Amy D Clegg, NP  colchicine 0.6 MG tablet Take 1 tablet (0.6 mg total) by mouth 2 (two) times daily. 02/20/14  Yes Nicole Pisciotta, PA-C  etodolac (LODINE) 500 MG tablet Take 1 tablet (500 mg total) by mouth 2 (two) times daily. 03/31/14  Yes Owens Loffler, MD  fenofibrate 54 MG tablet Take 1 tablet (54 mg total) by mouth daily. 10/22/13  Yes Amy D Clegg, NP  folic acid (FOLVITE) 1 MG tablet Take 1 tablet (1 mg total) by mouth daily. 10/22/13  Yes Amy D Clegg, NP  lisinopril (ZESTRIL) 5 MG tablet Take 1 tablet (5 mg total) by mouth daily. 10/22/13  Yes Amy D Clegg, NP  loperamide (IMODIUM) 2 MG capsule Take 2 mg by mouth as needed for diarrhea or loose stools.   Yes Historical Provider, MD  Multiple Vitamin (ONE-A-DAY MENS PO) Take 1 tablet by mouth.   Yes Historical Provider, MD  Omega-3 Fatty Acids (FISH OIL PO) Take 1 capsule by mouth daily.   Yes Historical Provider, MD  thiamine 100 MG tablet Take 1 tablet (100 mg total) by mouth daily. 09/16/13  Yes Thurnell Lose, MD  cyclobenzaprine (FLEXERIL) 10 MG tablet Take 1 tablet (10 mg total) by mouth 3 (three) times daily as needed for  muscle spasms. Patient not taking: Reported on 04/26/2014 12/31/13   [name], NP  loperamide (IMODIUM) 2 MG capsule Take 1 capsule (2 mg total) by mouth 4 (four) times daily as needed for diarrhea or loose stools. 04/26/14   Shalane Florendo L Lucie Friedlander, PA-C  ondansetron (ZOFRAN ODT) 4 MG disintegrating tablet Take 1 tablet (4 mg total) by mouth every 8 (eight) hours as needed for nausea or vomiting. 04/26/14   Tatiyanna Lashley L Faith Patricelli, PA-C  oxyCODONE-acetaminophen (PERCOCET/ROXICET) 5-325 MG per tablet Take 1 tablet by mouth every 4 (four) hours as needed for severe pain. May take 2 tablets PO q 6 hours for severe pain - Do not take with Tylenol as this tablet already contains tylenol 04/26/14   Kesi Perrow L Prynce Jacober, PA-C   BP 141/80 mmHg  Pulse 83  Temp(Src) 98.6 F (37 C) (Oral)  Resp 16  Ht 6\' 4"  (1.93 m)  Wt 190 lb (86.183 kg)  BMI 23.14 kg/m2  SpO2 98% Physical Exam  Constitutional: He is oriented to person, place, and time. He appears well-developed and well-nourished. No distress.  HENT:  Head: Normocephalic and atraumatic.  Right Ear: External ear normal.  Left Ear: External ear normal.  Nose: Nose normal.  Mouth/Throat: No oropharyngeal exudate.  Eyes: Conjunctivae are normal.  Neck: Neck supple.  Cardiovascular: Normal rate, regular rhythm and normal heart sounds.   Pulmonary/Chest: Effort normal and breath sounds normal. No respiratory distress.  Abdominal: Soft. Bowel sounds are normal. He exhibits no distension. There is tenderness. There is no rigidity, no rebound and no guarding.    Musculoskeletal: Normal range of motion. He exhibits no edema.  Neurological: He is alert and oriented to person, place, and time.  Skin: Skin is warm and dry. He is not diaphoretic.  Nursing note and vitals reviewed.   ED Course  Procedures (including critical care time) Medications  iohexol (OMNIPAQUE) 300 MG/ML solution 25 mL (not administered)  HYDROmorphone (DILAUDID) injection 1 mg  (1 mg Intravenous Given 04/26/14 1229)  sodium chloride 0.9 % bolus 1,000 mL (0 mLs Intravenous Stopped 04/26/14 1646)  HYDROmorphone (DILAUDID) injection 1 mg (1 mg Intravenous Given 04/26/14 1607)  HYDROmorphone (DILAUDID) injection 1 mg (1 mg Intravenous Given 04/26/14 1608)    Labs Review Labs Reviewed  COMPREHENSIVE METABOLIC PANEL - Abnormal; Notable for the following:    Creatinine, Ser 2.14 (*)    Albumin 3.1 (*)    GFR calc non Af Amer 32 (*)    GFR calc Af Amer 37 (*)    All other components within normal limits  CBC WITH DIFFERENTIAL/PLATELET - Abnormal; Notable for the following:    RBC 4.06 (*)    Hemoglobin 12.2 (*)    HCT 36.4 (*)  Neutrophils Relative % 38 (*)    Eosinophils Relative 14 (*)    Eosinophils Absolute 0.8 (*)    All other components within normal limits  LIPASE, BLOOD    Imaging Review Ct Abdomen Pelvis Wo Contrast  04/26/2014   CLINICAL DATA:  Diarrhea for 2 weeks.  Pain.  EXAM: CT ABDOMEN AND PELVIS WITHOUT CONTRAST  TECHNIQUE: Multidetector CT imaging of the abdomen and pelvis was performed following the standard protocol without IV contrast.  COMPARISON:  None.  FINDINGS: BODY WALL: Unremarkable.  LOWER CHEST: RCA atherosclerotic calcification.  Poor esophageal clearance versus gastroesophageal reflux.  ABDOMEN/PELVIS:  Liver: No focal abnormality.  Biliary: No evidence of biliary obstruction or stone.  Pancreas: Unremarkable.  Spleen: Unremarkable.  Adrenals: Unremarkable.  Kidneys and ureters: Mild right hydronephrosis and proximal hydroureter, with renal expansion and asymmetric perirenal edema. These changes are secondary to a 5 x 3 mm stone in the mid ureter.  Amorphous calculi present within the lower pole right kidney. Small volume high-density material layering within a 4.3 cm cyst in the right kidney could reflect milk of calcium or layering hemorrhage. The left kidney is negative.  Bladder: Unremarkable.  Reproductive: Negative.  Bowel: There is  suggestion of circumferential distal colonic wall thickening with fluid levels. This correlates patient's history of diarrhea. No bowel obstruction. Negative appendix  Retroperitoneum: No mass or adenopathy.  Peritoneum: No ascites or pneumoperitoneum.  Vascular: No acute abnormality.  OSSEOUS: Advanced mid and lower lumbar disc degeneration with disc narrowing and spurring causing bilateral foraminal stenosis.  Right hip arthroplasty with extensive heterotopic ossification.  IMPRESSION: 1. Obstructing 5 x 3 mm stone in the mid right ureter. 2. Probable distal colitis. 3. Right nephrolithiasis. 4. Coronary atherosclerosis.   Electronically Signed   By: Jorje Guild M.D.   On: 04/26/2014 15:53     EKG Interpretation None      MDM   Final diagnoses:  Kidney stone  Colitis    Filed Vitals:   04/26/14 1646  BP: 141/80  Pulse: 83  Temp:   Resp: 16   Afebrile, NAD, non-toxic appearing, AAOx4.  I have reviewed nursing notes, vital signs, and all appropriate lab and imaging results for this patient.  1) Kidney Stone: Pt has been diagnosed with a Kidney Stone via CT. There is no evidence of significant hydronephrosis, serum creatine mildly increased advised recheck in one week, vitals sign stable and the pt does not have irratractable vomiting. Pt will be dc home with pain medications & has been advised to follow up with urology.   2) Colitis: Abdomen soft, tender in right upper quadrant. Side no peritoneal signs. No CVA tenderness. Labs reviewed. CT scan confirms colitis symptomatic measures discussed.  Return precautions discussed. Patient is stable at time of discharge Patient d/w with Dr. Venora Maples, agrees with plan.    Harlow Mares, PA-C 04/26/14 [place], MD 04/26/14 [phone]

## 2014-04-27 ENCOUNTER — Telehealth: Payer: Self-pay | Admitting: Internal Medicine

## 2014-04-27 NOTE — Telephone Encounter (Signed)
Spoke with patient re: referral.  He wanted PCP [name] to know that he went to the ER yesterday 04/26/2014 passing kidney stones.  He states his "issues" are resolved.  Best number to call pt if needed is [phone] / lt

## 2014-04-27 NOTE — Telephone Encounter (Signed)
noted 

## 2014-05-03 ENCOUNTER — Ambulatory Visit (INDEPENDENT_AMBULATORY_CARE_PROVIDER_SITE_OTHER): Payer: Medicare Other | Admitting: Internal Medicine

## 2014-05-03 ENCOUNTER — Encounter: Payer: Self-pay | Admitting: Internal Medicine

## 2014-05-03 VITALS — BP 142/82 | HR 78 | Temp 98.4°F | Wt 187.0 lb

## 2014-05-03 DIAGNOSIS — R197 Diarrhea, unspecified: Secondary | ICD-10-CM

## 2014-05-03 DIAGNOSIS — R110 Nausea: Secondary | ICD-10-CM

## 2014-05-03 DIAGNOSIS — R140 Abdominal distension (gaseous): Secondary | ICD-10-CM

## 2014-05-03 LAB — COMPREHENSIVE METABOLIC PANEL
ALT: 13 U/L (ref 0–53)
AST: 15 U/L (ref 0–37)
Albumin: 3.6 g/dL (ref 3.5–5.2)
Alkaline Phosphatase: 75 U/L (ref 39–117)
BUN: 16 mg/dL (ref 6–23)
CO2: 29 mEq/L (ref 19–32)
CREATININE: 1.29 mg/dL (ref 0.40–1.50)
Calcium: 9.3 mg/dL (ref 8.4–10.5)
Chloride: 107 mEq/L (ref 96–112)
GFR: 73.49 mL/min (ref >60.00)
GLUCOSE: 91 mg/dL (ref 70–99)
Potassium: 4.1 mEq/L (ref 3.5–5.1)
Sodium: 140 mEq/L (ref 135–145)
Total Bilirubin: 0.4 mg/dL (ref 0.2–1.2)
Total Protein: 6.3 g/dL (ref 6.0–8.3)

## 2014-05-03 MED ORDER — ONDANSETRON 4 MG PO TBDP: 4.0000 mg | ORAL_TABLET | Freq: Three times a day (TID) | ORAL | Status: DC | PRN

## 2014-05-03 MED ORDER — DIPHENOXYLATE-ATROPINE 2.5-0.025 MG PO TABS: 1.0000 | ORAL_TABLET | Freq: Four times a day (QID) | ORAL | Status: DC | PRN

## 2014-05-03 NOTE — Progress Notes (Signed)
Pre visit review using our clinic review tool, if applicable. No additional management support is needed unless otherwise documented below in the visit note. 

## 2014-05-03 NOTE — Patient Instructions (Addendum)
Diarrhea Diarrhea is frequent loose and watery bowel movements. It can cause you to feel weak and dehydrated. Dehydration can cause you to become tired and thirsty, have a dry mouth, and have decreased urination that often is dark yellow. Diarrhea is a sign of another problem, most often an infection that will not last long. In most cases, diarrhea typically lasts 2-3 days. However, it can last longer if it is a sign of something more serious. It is important to treat your diarrhea as directed by your caregiver to lessen or prevent future episodes of diarrhea. CAUSES  Some common causes include:  Gastrointestinal infections caused by viruses, bacteria, or parasites.  Food poisoning or food allergies.  Certain medicines, such as antibiotics, chemotherapy, and laxatives.  Artificial sweeteners and fructose.  Digestive disorders. HOME CARE INSTRUCTIONS  Ensure adequate fluid intake (hydration): Have 1 cup (8 oz) of fluid for each diarrhea episode. Avoid fluids that contain simple sugars or sports drinks, fruit juices, whole milk products, and sodas. Your urine should be clear or pale yellow if you are drinking enough fluids. Hydrate with an oral rehydration solution that you can purchase at pharmacies, retail stores, and online. You can prepare an oral rehydration solution at home by mixing the following ingredients together:   - tsp table salt.   tsp baking soda.   tsp salt substitute containing potassium chloride.  1  tablespoons sugar.  1 L (34 oz) of water.  Certain foods and beverages may increase the speed at which food moves through the gastrointestinal (GI) tract. These foods and beverages should be avoided and include:  Caffeinated and alcoholic beverages.  High-fiber foods, such as raw fruits and vegetables, nuts, seeds, and whole grain breads and cereals.  Foods and beverages sweetened with sugar alcohols, such as xylitol, sorbitol, and mannitol.  Some foods may be well  tolerated and may help thicken stool including:  Starchy foods, such as rice, toast, pasta, low-sugar cereal, oatmeal, grits, baked potatoes, crackers, and bagels.  Bananas.  Applesauce.  Add probiotic-rich foods to help increase healthy bacteria in the GI tract, such as yogurt and fermented milk products.  Wash your hands well after each diarrhea episode.  Only take over-the-counter or prescription medicines as directed by your caregiver.  Take a warm bath to relieve any burning or pain from frequent diarrhea episodes. SEEK IMMEDIATE MEDICAL CARE IF:   You are unable to keep fluids down.  You have persistent vomiting.  You have blood in your stool, or your stools are black and tarry.  You do not urinate in 6-8 hours, or there is only a small amount of very dark urine.  You have abdominal pain that increases or localizes.  You have weakness, dizziness, confusion, or light-headedness.  You have a severe headache.  Your diarrhea gets worse or does not get better.  You have a fever or persistent symptoms for more than 2-3 days.  You have a fever and your symptoms suddenly get worse. MAKE SURE YOU:   Understand these instructions.  Will watch your condition.  Will get help right away if you are not doing well or get worse. Document Released: 03/02/2002 Document Revised: 07/27/2013 Document Reviewed: 11/18/2011 [facility] Patient Information 2015 [place], [place]. This information is not intended to replace advice given to you by your health care provider. Make sure you discuss any questions you have with your health care provider. Diarrhea Diarrhea is watery poop (stool). It can make you feel weak, tired, thirsty, or give you  a dry mouth (signs of dehydration). Watery poop is a sign of another problem, most often an infection. It often lasts 2-3 days. It can last longer if it is a sign of something serious. Take care of yourself as told by your doctor. HOME CARE   Drink 1  cup (8 ounces) of fluid each time you have watery poop.  Do not drink the following fluids:  Those that contain simple sugars (fructose, glucose, galactose, lactose, sucrose, maltose).  Sports drinks.  Fruit juices.  Whole milk products.  Sodas.  Drinks with caffeine (coffee, tea, soda) or alcohol.  Oral rehydration solution may be used if the doctor says it is okay. You may make your own solution. Follow this recipe:   - teaspoon table salt.   teaspoon baking soda.   teaspoon salt substitute containing potassium chloride.  1 tablespoons sugar.  1 liter (34 ounces) of water.  Avoid the following foods:  High fiber foods, such as raw fruits and vegetables.  Nuts, seeds, and whole grain breads and cereals.   Those that are sweetened with sugar alcohols (xylitol, sorbitol, mannitol).  Try eating the following foods:  Starchy foods, such as rice, toast, pasta, low-sugar cereal, oatmeal, baked potatoes, crackers, and bagels.  Bananas.  Applesauce.  Eat probiotic-rich foods, such as yogurt and milk products that are fermented.  Wash your hands well after each time you have watery poop.  Only take medicine as told by your doctor.  Take a warm bath to help lessen burning or pain from having watery poop. GET HELP RIGHT AWAY IF:   You cannot drink fluids without throwing up (vomiting).  You keep throwing up.  You have blood in your poop, or your poop looks black and tarry.  You do not pee (urinate) in 6-8 hours, or there is only a small amount of very dark pee.  You have belly (abdominal) pain that gets worse or stays in the same spot (localizes).  You are weak, dizzy, confused, or light-headed.  You have a very bad headache.  Your watery poop gets worse or does not get better.  You have a fever or lasting symptoms for more than 2-3 days.  You have a fever and your symptoms suddenly get worse. MAKE SURE YOU:   Understand these instructions.  Will  watch your condition.  Will get help right away if you are not doing well or get worse. Document Released: 08/29/2007 Document Revised: 07/27/2013 Document Reviewed: 11/18/2011 [facility] Patient Information 2015 [employer], [place]. This information is not intended to replace advice given to you by your health care provider. Make sure you discuss any questions you have with your health care provider.

## 2014-05-03 NOTE — Progress Notes (Signed)
Subjective:    Patient ID: Travis [name], male    DOB: [birth year], 59 y.o.   MRN: [id number]  HPI  Pt presents to the clinic today with c/o diarrhea. He reports this started 3 weeks ago. He has had a some bloating and gas. He reports his stool is loose, and dark green in color. He has at least 4 stools per day. He has has some  nausea. He has not noticed any blood in his stool, abdominal pain or rectal pain. He did have some issues with hemorrhoids but they have improved. He has been drinking lots of gatorade and took Imodium OTC without any relief. He has not hand any change in his diet or medications. He report he was put on antibiotics at [facility] ER, but hospital note says nothing about antibiotics. He reports he no longer drinks alcohol.  Of note, he does tell me that he went to [facility] ER and [place] for the same. No [facility] ER notes to review, but her reports they treated him for gastroenteritis. [facility] [place], CT showed kidney stones, which he did pass, and colitis. He was given zofran and percocet and sent home.  Review of Systems      Past Medical History  Diagnosis Date  . Systolic heart failure     EF 20-25% by echo 12/28/10  . Nonischemic cardiomyopathy     minimal coronary disease by cath 12/29/10  . Hemoptysis     secondary to pulmonary edema  . Hyperlipidemia   . Chronic anemia   . GI bleed     15 years ago  . Tobacco abuse   . Alcohol abuse   . Osteoarthritis   . Gout   . Diabetes mellitus, type 2     pt reports his DM is gone  . 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 aspirin 325 MG EC tablet Take 325 mg by mouth daily.    [name] atorvastatin (LIPITOR) 10 MG tablet Take 1 tablet (10 mg total) by mouth daily at 6 PM. 30 tablet 6  . carvedilol (COREG) 3.125 MG tablet Take 1 tablet (3.125 mg total) by mouth 2 (two) times daily with a meal. 60 tablet 6  . colchicine 0.6 MG tablet Take 1 tablet (0.6 mg total) by  mouth 2 (two) times daily. 12 tablet 0  . cyclobenzaprine (FLEXERIL) 10 MG tablet Take 1 tablet (10 mg total) by mouth 3 (three) times daily as needed for muscle spasms. 30 tablet 0  . etodolac (LODINE) 500 MG tablet Take 1 tablet (500 mg total) by mouth 2 (two) times daily. 60 tablet 3  . fenofibrate 54 MG tablet Take 1 tablet (54 mg total) by mouth daily. 30 tablet 6  . folic acid (FOLVITE) 1 MG tablet Take 1 tablet (1 mg total) by mouth daily. 30 tablet 6  . lisinopril (ZESTRIL) 5 MG tablet Take 1 tablet (5 mg total) by mouth daily. 30 tablet 6  . loperamide (IMODIUM) 2 MG capsule Take 2 mg by mouth as needed for diarrhea or loose stools.    [name] loperamide (IMODIUM) 2 MG capsule Take 1 capsule (2 mg total) by mouth 4 (four) times daily as needed for diarrhea or loose stools. 12 capsule 0  . Multiple Vitamin (ONE-A-DAY MENS PO) Take 1 tablet by mouth.    . Omega-3 Fatty Acids (FISH OIL PO) Take 1 capsule by mouth daily.    . ondansetron [facility]  ODT) 4 MG disintegrating tablet Take 1 tablet (4 mg total) by mouth every 8 (eight) hours as needed for nausea or vomiting. 20 tablet 0  . oxyCODONE-acetaminophen (PERCOCET/ROXICET) 5-325 MG per tablet Take 1 tablet by mouth every 4 (four) hours as needed for severe pain. May take 2 tablets PO q 6 hours for severe pain - Do not take with Tylenol as this tablet already contains tylenol 15 tablet 0  . thiamine 100 MG tablet Take 1 tablet (100 mg total) by mouth daily. 30 tablet 0  . ibuprofen (ADVIL,MOTRIN) 800 MG tablet   0   No current facility-administered medications for this visit.    No Known Allergies  Family History  Problem Relation Age of Onset  . Diabetes Mother   . Hypertension Mother   . Diabetes Father   . Hypertension Father   . Cancer Neg Hx   . Heart disease Neg Hx   . Stroke Neg Hx     History   Social History  . Marital Status: Single    Spouse Name: N/A    Number of Children: N/A  . Years of Education: N/A    Occupational History  . Not on file.   Social History Main Topics  . Smoking status: Current Every Day Smoker -- 0.75 packs/day for 30 years    Types: Cigarettes  . Smokeless tobacco: Never Used  . Alcohol Use: 0.6 oz/week    1 Glasses of wine per week     Comment: occasionally  . Drug Use: No  . Sexual Activity: No   Other Topics Concern  . Not on file   Social History Narrative     Constitutional: Denies fever, malaise, fatigue, headache or abrupt weight changes.  HEENT: Denies eye pain, eye redness, ear pain, ringing in the ears, wax buildup, runny nose, nasal congestion, bloody nose, or sore throat. Respiratory: Denies difficulty breathing, shortness of breath, cough or sputum production.   Cardiovascular: Denies chest pain, chest tightness, palpitations or swelling in the hands or feet.  Gastrointestinal: Pt reports diarrhea and bloating. Denies abdominal pain, constipation, or blood in the stool.  GU: Denies urgency, frequency, pain with urination, burning sensation, blood in urine, odor or discharge. Skin: Denies redness, rashes, lesions or ulcercations.  Neurological: Denies dizziness, difficulty with memory, difficulty with speech or problems with balance and coordination.   No other specific complaints in a complete review of systems (except as listed in HPI above).  Objective:   Physical Exam  BP 142/82 mmHg  Pulse 78  Temp(Src) 98.4 F (36.9 C) (Oral)  Wt 187 lb (84.823 kg)  SpO2 99% Wt Readings from Last 3 Encounters:  05/03/14 187 lb (84.823 kg)  04/26/14 190 lb (86.183 kg)  03/31/14 191 lb (86.637 kg)    General: Appears his stated age, well developed, well nourished in NAD. Skin: Warm, dry and intact. No rashes, lesions or ulcerations noted. Cardiovascular: Normal rate and rhythm. S1,S2 noted.  No murmur, rubs or gallops noted.  Pulmonary/Chest: Normal effort and positive vesicular breath sounds. No respiratory distress. No wheezes, rales or ronchi  noted.  Abdomen: Soft and nontender. Normal bowel sounds, no bruits noted. No distention or masses noted. Liver, spleen and kidneys non palpable. Neurological: Alert and oriented.  Psychiatric: Mood and affect normal. Behavior is normal. Judgment and thought content normal.    BMET    Component Value Date/Time   NA 141 04/26/2014 1159   NA 145* 02/25/2012 1409   K 3.6 04/26/2014  1159   CL 105 04/26/2014 1159   CO2 29 04/26/2014 1159   GLUCOSE 89 04/26/2014 1159   GLUCOSE 87 02/25/2012 1409   BUN 15 04/26/2014 1159   BUN 11 02/25/2012 1409   CREATININE 2.14* 04/26/2014 1159   CALCIUM 9.2 04/26/2014 1159   GFRNONAA 32* 04/26/2014 1159   GFRAA 37* 04/26/2014 1159    Lipid Panel     Component Value Date/Time   CHOL 153 12/31/2013 1403   TRIG 221.0* 12/31/2013 1403   HDL 28.70* 12/31/2013 1403   CHOLHDL 5 12/31/2013 1403   VLDL 44.2* 12/31/2013 1403   LDLCALC UNABLE TO CALCULATE IF TRIGLYCERIDE OVER 400 mg/dL 09/14/2013 0510    CBC    Component Value Date/Time   WBC 5.8 04/26/2014 1159   RBC 4.06* 04/26/2014 1159   RBC 2.93* 12/28/2010 0345   HGB 12.2* 04/26/2014 1159   HCT 36.4* 04/26/2014 1159   PLT 333 04/26/2014 1159   MCV 89.7 04/26/2014 1159   MCH 30.0 04/26/2014 1159   MCHC 33.5 04/26/2014 1159   RDW 15.0 04/26/2014 1159   LYMPHSABS 2.2 04/26/2014 1159   MONOABS 0.5 04/26/2014 1159   EOSABS 0.8* 04/26/2014 1159   BASOSABS 0.0 04/26/2014 1159    Hgb A1C Lab Results  Component Value Date   HGBA1C 6.6* 12/31/2013         Assessment & Plan:   Diarrhea, nausea, bloating:  [place] notes, labs, imaging reviewed Improving per pt Will check CMET Will stop Imodium and start Lomotil- RX provided I also refilled his zofran for nausea If no improvement by Friday, will obtain stool for Cdiff and culture  RTC as needed or if symptoms persist or worsen

## 2014-05-27 ENCOUNTER — Other Ambulatory Visit (HOSPITAL_COMMUNITY): Payer: Self-pay | Admitting: *Deleted

## 2014-05-27 MED ORDER — FOLIC ACID 1 MG PO TABS: 1.0000 mg | ORAL_TABLET | Freq: Every day | ORAL | Status: DC

## 2014-05-27 MED ORDER — FENOFIBRATE 54 MG PO TABS: 54.0000 mg | ORAL_TABLET | Freq: Every day | ORAL | Status: DC

## 2014-05-27 MED ORDER — ATORVASTATIN CALCIUM 10 MG PO TABS: 10.0000 mg | ORAL_TABLET | Freq: Every day | ORAL | Status: DC

## 2014-05-27 MED ORDER — CARVEDILOL 3.125 MG PO TABS: 3.1250 mg | ORAL_TABLET | Freq: Two times a day (BID) | ORAL | Status: DC

## 2014-06-02 ENCOUNTER — Ambulatory Visit (INDEPENDENT_AMBULATORY_CARE_PROVIDER_SITE_OTHER): Payer: Medicare Other | Admitting: Family Medicine

## 2014-06-02 ENCOUNTER — Encounter: Payer: Self-pay | Admitting: Family Medicine

## 2014-06-02 VITALS — BP 124/80 | HR 95 | Temp 98.5°F | Ht 73.5 in | Wt 184.5 lb

## 2014-06-02 DIAGNOSIS — R197 Diarrhea, unspecified: Secondary | ICD-10-CM

## 2014-06-02 DIAGNOSIS — Z550 Illiteracy and low-level literacy: Secondary | ICD-10-CM

## 2014-06-02 NOTE — Patient Instructions (Signed)
GETTING TO GOOD BOWEL HEALTH. Irregular bowel habits such as constipation and diarrhea can lead to many problems over time.  Having one soft bowel movement a day is the most important way to prevent further problems.  The anorectal canal is designed to handle stretching and feces to safely manage our ability to get rid of solid waste (feces, poop, stool) out of our body.  BUT, hard constipated stools can act like ripping concrete bricks and diarrhea can be a burning fire to this very sensitive area of our body, causing inflamed hemorrhoids, anal fissures, increasing risk is perirectal abscesses, abdominal pain/bloating, an making irritable bowel worse.     The goal: ONE SOFT BOWEL MOVEMENT A DAY!  To have soft, regular bowel movements:  . Drink at least 8 tall glasses of water a day.   . Take plenty of fiber.  Fiber is the undigested part of plant food that passes into the colon, acting s "natures broom" to encourage bowel motility and movement.  Fiber can absorb and hold large amounts of water. This results in a larger, bulkier stool, which is soft and easier to pass. Work gradually over several weeks up to 6 servings a day of fiber (25g a day even more if needed) in the form of: o Vegetables -- Root (potatoes, carrots, turnips), leafy green (lettuce, salad greens, celery, spinach), or cooked high residue (cabbage, broccoli, etc) o Fruit -- Fresh (unpeeled skin & pulp), Dried (prunes, apricots, cherries, etc ),  or stewed ( applesauce)  o Whole grain breads, pasta, etc (whole wheat)  o Bran cereals  . Bulking Agents -- This type of water-retaining fiber generally is easily obtained each day by one of the following:  o Psyllium bran -- The psyllium plant is remarkable because its ground seeds can retain so much water. This product is available as Metamucil, Konsyl, Effersyllium, Per Diem Fiber, or the less expensive generic preparation in drug and health food stores. Although labeled a laxative, it really  is not a laxative.  o Methylcellulose -- This is another fiber derived from wood which also retains water. It is available as Citrucel. o Polyethylene Glycol - and "artificial" fiber commonly called Miralax or Glycolax.  It is helpful for people with gassy or bloated feelings with regular fiber o Flax Seed - a less gassy fiber than psyllium . No reading or other relaxing activity while on the toilet. If bowel movements take longer than 5 minutes, you are too constipated . AVOID CONSTIPATION.  High fiber and water intake usually takes care of this.  Sometimes a laxative is needed to stimulate more frequent bowel movements, but  . Laxatives are not a good long-term solution as it can wear the colon out. o Osmotics (Milk of Magnesia, Fleets phosphosoda, Magnesium citrate, MiraLax, GoLytely) are safer than  o Stimulants (Senokot, Castor Oil, Dulcolax, Ex Lax)    o Do not take laxatives for more than 7days in a row. .  IF SEVERELY CONSTIPATED, try a Bowel Retraining Program: o Do not use laxatives.  o Eat a diet high in roughage, such as bran cereals and leafy vegetables.  o Drink six (6) ounces of prune or apricot juice each morning.  o Eat two (2) large servings of stewed fruit each day.  o Take one (1) heaping tablespoon of a psyllium-based bulking agent twice a day. Use sugar-free sweetener when possible to avoid excessive calories.  o Eat a normal breakfast.  o Set aside 15 minutes after breakfast to  sit on the toilet, but do not strain to have a bowel movement.  o If you do not have a bowel movement by the third day, use an enema and repeat the above steps.  . Controlling diarrhea o Switch to liquids and simpler foods for a few days to avoid stressing your intestines further. o Avoid dairy products (especially milk & ice cream) for a short time.  The intestines often can lose the ability to digest lactose when stressed. o Avoid foods that cause gassiness or bloating.  Typical foods include  beans and other legumes, cabbage, broccoli, and dairy foods.  Every person has some sensitivity to other foods, so listen to our body and avoid those foods that trigger problems for you. o Adding fiber (Citrucel, Metamucil, psyllium, Miralax) gradually can help thicken stools by absorbing excess fluid and retrain the intestines to act more normally.  Slowly increase the dose over a few weeks.  Too much fiber too soon can backfire and cause cramping & bloating. o Probiotics (such as active yogurt, Align, etc) may help repopulate the intestines and colon with normal bacteria and calm down a sensitive digestive tract.  Most studies show it to be of mild help, though, and such products can be costly. o Medicines: - Bismuth subsalicylate (ex. Kayopectate, Pepto Bismol) every 30 minutes for up to 6 doses can help control diarrhea.  Avoid if pregnant. - Loperamide (Immodium) can slow down diarrhea.  Start with two tablets (4mg  total) first and then try one tablet every 6 hours.  Avoid if you are having fevers or severe pain.  If you are not better or start feeling worse, stop all medicines and call your doctor for advice -

## 2014-06-02 NOTE — Progress Notes (Signed)
Dr. Frederico Hamman T. Asser Lucena, MD, [facility] [place], [address] Phone: [phone] Fax: [phone]  06/02/2014  Patient: Travis [name], MRN: [id number], DOB: Aug 16, 1955, 59 y.o.  Primary Physician:  [name], NP  Chief Complaint: Diarrhea and Gas  Subjective:   Travis [name] is a 59 y.o. very pleasant male patient who presents with the following:  Very nice patient who I remember well who is now had some loose stools for approximately 6 weeks.  Initially he went to the ER, and was diagnosed with gastroenteritis.  At that time he was having far more frequent stools, but at this point he is having some stools about every 2 or 3 times a day and they're a lot more loose than normal.  He is also having a lot of gas and some rumbling in his stomach.  He is quit drinking alcohol, and he is been clean and sober now for several months.  Stomach, pills cleared it up. Cleared up after a few days.  No camping or travelling.   Past Medical History, Surgical History, Social History, Family History, Problem List, Medications, and Allergies have been reviewed and updated if relevant.   GEN: No acute illnesses, no fevers, chills. GI: as above Pulm: No SOB Interactive and getting along well at home.  Otherwise, ROS is as per the HPI.  Objective:   BP 124/80 mmHg  Pulse 95  Temp(Src) 98.5 F (36.9 C) (Oral)  Ht 6' 1.5" (1.867 m)  Wt 184 lb 8 oz (83.689 kg)  BMI 24.01 kg/m2  GEN: WDWN, NAD, Non-toxic, A & O x 3 HEENT: Atraumatic, Normocephalic. Neck supple. No masses, No LAD. Ears and Nose: No external deformity. CV: RRR, No M/G/R. No JVD. No thrill. No extra heart sounds. PULM: CTA B, no wheezes, crackles, rhonchi. No retractions. No resp. distress. No accessory muscle use. ABD: S, NT, ND, hyperactive BS, No rebound, No HSM  EXTR: No c/c/e NEURO Normal gait.  PSYCH: Normally interactive. Conversant. Not  depressed or anxious appearing.  Calm demeanor.   Laboratory and Imaging Data:  Assessment and Plan:   Frequent loose stools  Any infectious gastroenteritis at this point should be completely cleared.  I suspect his intestinal tract got disrupted from a likely infection.  C. Difficile is also a possibility, but he has improved.  Treat clinically, basically, increase fluid intake.  Relatively poor diet.  Add fiber   Patient Instructions  GETTING TO GOOD BOWEL HEALTH. Irregular bowel habits such as constipation and diarrhea can lead to many problems over time.  Having one soft bowel movement a day is the most important way to prevent further problems.  The anorectal canal is designed to handle stretching and feces to safely manage our ability to get rid of solid waste (feces, poop, stool) out of our body.  BUT, hard constipated stools can act like ripping concrete bricks and diarrhea can be a burning fire to this very sensitive area of our body, causing inflamed hemorrhoids, anal fissures, increasing risk is perirectal abscesses, abdominal pain/bloating, an making irritable bowel worse.     The goal: ONE SOFT BOWEL MOVEMENT A DAY!  To have soft, regular bowel movements:  . Drink at least 8 tall glasses of water a day.   . Take plenty of fiber.  Fiber is the undigested part of plant food that passes into the colon, acting s "natures broom" to encourage bowel motility and movement.  Fiber can absorb and hold large amounts of water. This results in a larger, bulkier stool, which is soft and easier to pass. Work gradually over several weeks up to 6 servings a day of fiber (25g a day even more if needed) in the form of: o Vegetables -- Root (potatoes, carrots, turnips), leafy green (lettuce, salad greens, celery, spinach), or cooked high residue (cabbage, broccoli, etc) o Fruit -- Fresh (unpeeled skin & pulp), Dried (prunes, apricots, cherries, etc ),  or stewed ( applesauce)  o Whole grain breads,  pasta, etc (whole wheat)  o Bran cereals  . Bulking Agents -- This type of water-retaining fiber generally is easily obtained each day by one of the following:  o Psyllium bran -- The psyllium plant is remarkable because its ground seeds can retain so much water. This product is available as Metamucil, Konsyl, Effersyllium, Per Diem Fiber, or the less expensive generic preparation in drug and health food stores. Although labeled a laxative, it really is not a laxative.  o Methylcellulose -- This is another fiber derived from wood which also retains water. It is available as Citrucel. o Polyethylene Glycol - and "artificial" fiber commonly called Miralax or Glycolax.  It is helpful for people with gassy or bloated feelings with regular fiber o Flax Seed - a less gassy fiber than psyllium . No reading or other relaxing activity while on the toilet. If bowel movements take longer than 5 minutes, you are too constipated . AVOID CONSTIPATION.  High fiber and water intake usually takes care of this.  Sometimes a laxative is needed to stimulate more frequent bowel movements, but  . Laxatives are not a good long-term solution as it can wear the colon out. o Osmotics (Milk of Magnesia, Fleets phosphosoda, Magnesium citrate, MiraLax, GoLytely) are safer than  o Stimulants (Senokot, Castor Oil, Dulcolax, Ex Lax)    o Do not take laxatives for more than 7days in a row. .  IF SEVERELY CONSTIPATED, try a Bowel Retraining Program: o Do not use laxatives.  o Eat a diet high in roughage, such as bran cereals and leafy vegetables.  o Drink six (6) ounces of prune or apricot juice each morning.  o Eat two (2) large servings of stewed fruit each day.  o Take one (1) heaping tablespoon of a psyllium-based bulking agent twice a day. Use sugar-free sweetener when possible to avoid excessive calories.  o Eat a normal breakfast.  o Set aside 15 minutes after breakfast to sit on the toilet, but do not strain to have a  bowel movement.  o If you do not have a bowel movement by the third day, use an enema and repeat the above steps.  . Controlling diarrhea o Switch to liquids and simpler foods for a few days to avoid stressing your intestines further. o Avoid dairy products (especially milk & ice cream) for a short time.  The intestines often can lose the ability to digest lactose when stressed. o Avoid foods that cause gassiness or bloating.  Typical foods include beans and other legumes, cabbage, broccoli, and dairy foods.  Every person has some sensitivity to other foods, so listen to our body and avoid those foods that trigger problems for you. o Adding fiber (Citrucel, Metamucil, psyllium, Miralax) gradually can help thicken stools by absorbing excess fluid and retrain the intestines to act more normally.  Slowly increase the dose over a few weeks.  Too much fiber too soon can backfire and cause cramping &  bloating. o Probiotics (such as active yogurt, Align, etc) may help repopulate the intestines and colon with normal bacteria and calm down a sensitive digestive tract.  Most studies show it to be of mild help, though, and such products can be costly. o Medicines: - Bismuth subsalicylate (ex. Kayopectate, Pepto Bismol) every 30 minutes for up to 6 doses can help control diarrhea.  Avoid if pregnant. - Loperamide (Immodium) can slow down diarrhea.  Start with two tablets (4mg  total) first and then try one tablet every 6 hours.  Avoid if you are having fevers or severe pain.  If you are not better or start feeling worse, stop all medicines and call your doctor for advice -       Signed,  Maud Deed. Sheily Lineman, MD   Patient's Medications  New Prescriptions   No medications on file  Previous Medications   ASPIRIN 325 MG EC TABLET    Take 325 mg by mouth daily.   ATORVASTATIN (LIPITOR) 10 MG TABLET    Take 1 tablet (10 mg total) by mouth daily at 6 PM.   CARVEDILOL (COREG) 3.125 MG TABLET    Take 1 tablet  (3.125 mg total) by mouth 2 (two) times daily with a meal.   COLCHICINE 0.6 MG TABLET    Take 1 tablet (0.6 mg total) by mouth 2 (two) times daily.   CYCLOBENZAPRINE (FLEXERIL) 10 MG TABLET    Take 1 tablet (10 mg total) by mouth 3 (three) times daily as needed for muscle spasms.   DICYCLOMINE (BENTYL) 20 MG TABLET       DIPHENOXYLATE-ATROPINE (LOMOTIL) 2.5-0.025 MG PER TABLET    Take 1 tablet by mouth 4 (four) times daily as needed for diarrhea or loose stools.   ETODOLAC (LODINE) 500 MG TABLET    Take 1 tablet (500 mg total) by mouth 2 (two) times daily.   FENOFIBRATE 54 MG TABLET    Take 1 tablet (54 mg total) by mouth daily.   FOLIC ACID (FOLVITE) 1 MG TABLET    Take 1 tablet (1 mg total) by mouth daily.   IBUPROFEN (ADVIL,MOTRIN) 800 MG TABLET       LISINOPRIL (PRINIVIL,ZESTRIL) 5 MG TABLET       LOPERAMIDE (IMODIUM) 2 MG CAPSULE    Take 2 mg by mouth as needed for diarrhea or loose stools.   MULTIPLE VITAMIN (ONE-A-DAY MENS PO)    Take 1 tablet by mouth.   OMEGA-3 FATTY ACIDS (FISH OIL PO)    Take 1 capsule by mouth daily.   ONDANSETRON (ZOFRAN ODT) 4 MG DISINTEGRATING TABLET    Take 1 tablet (4 mg total) by mouth every 8 (eight) hours as needed for nausea or vomiting.   ONDANSETRON (ZOFRAN) 4 MG TABLET       OXYCODONE-ACETAMINOPHEN (PERCOCET/ROXICET) 5-325 MG PER TABLET    Take 1 tablet by mouth every 4 (four) hours as needed for severe pain. May take 2 tablets PO q 6 hours for severe pain - Do not take with Tylenol as this tablet already contains tylenol   THIAMINE 100 MG TABLET    Take 1 tablet (100 mg total) by mouth daily.  Modified Medications   No medications on file  Discontinued Medications   No medications on file

## 2014-06-02 NOTE — Progress Notes (Signed)
Pre visit review using our clinic review tool, if applicable. No additional management support is needed unless otherwise documented below in the visit note. 

## 2014-06-04 DIAGNOSIS — Z550 Illiteracy and low-level literacy: Secondary | ICD-10-CM | POA: Insufficient documentation

## 2014-06-12 ENCOUNTER — Emergency Department (HOSPITAL_COMMUNITY)
Admission: EM | Admit: 2014-06-12 | Discharge: 2014-06-12 | Disposition: A | Discharge status: Home / Self Care | Payer: Medicare Other | Attending: Emergency Medicine | Admitting: Emergency Medicine

## 2014-06-12 ENCOUNTER — Encounter (HOSPITAL_COMMUNITY): Payer: Self-pay | Admitting: *Deleted

## 2014-06-12 DIAGNOSIS — Z9889 Other specified postprocedural states: Secondary | ICD-10-CM | POA: Insufficient documentation

## 2014-06-12 DIAGNOSIS — M19042 Primary osteoarthritis, left hand: Secondary | ICD-10-CM | POA: Insufficient documentation

## 2014-06-12 DIAGNOSIS — M1990 Unspecified osteoarthritis, unspecified site: Secondary | ICD-10-CM

## 2014-06-12 DIAGNOSIS — M7989 Other specified soft tissue disorders: Secondary | ICD-10-CM | POA: Diagnosis present

## 2014-06-12 DIAGNOSIS — Z720 Tobacco use: Secondary | ICD-10-CM | POA: Diagnosis not present

## 2014-06-12 DIAGNOSIS — M109 Gout, unspecified: Secondary | ICD-10-CM

## 2014-06-12 DIAGNOSIS — Z791 Long term (current) use of non-steroidal anti-inflammatories (NSAID): Secondary | ICD-10-CM | POA: Insufficient documentation

## 2014-06-12 DIAGNOSIS — M10042 Idiopathic gout, left hand: Secondary | ICD-10-CM | POA: Diagnosis not present

## 2014-06-12 DIAGNOSIS — Z79899 Other long term (current) drug therapy: Secondary | ICD-10-CM | POA: Insufficient documentation

## 2014-06-12 DIAGNOSIS — E785 Hyperlipidemia, unspecified: Secondary | ICD-10-CM | POA: Diagnosis not present

## 2014-06-12 DIAGNOSIS — I5020 Unspecified systolic (congestive) heart failure: Secondary | ICD-10-CM | POA: Insufficient documentation

## 2014-06-12 DIAGNOSIS — D649 Anemia, unspecified: Secondary | ICD-10-CM | POA: Insufficient documentation

## 2014-06-12 DIAGNOSIS — Z7982 Long term (current) use of aspirin: Secondary | ICD-10-CM | POA: Insufficient documentation

## 2014-06-12 DIAGNOSIS — R197 Diarrhea, unspecified: Secondary | ICD-10-CM | POA: Insufficient documentation

## 2014-06-12 DIAGNOSIS — E119 Type 2 diabetes mellitus without complications: Secondary | ICD-10-CM | POA: Diagnosis not present

## 2014-06-12 MED ORDER — OXYCODONE-ACETAMINOPHEN 5-325 MG PO TABS: 1.0000 | ORAL_TABLET | ORAL | Status: DC | PRN

## 2014-06-12 MED ORDER — OXYCODONE-ACETAMINOPHEN 5-325 MG PO TABS
1.0000 | ORAL_TABLET | Freq: Once | ORAL | Status: AC
  Administered 2014-06-12: 1 via ORAL
  Filled 2014-06-12: qty 1

## 2014-06-12 MED ORDER — PREDNISONE (PAK) 10 MG PO TABS
ORAL_TABLET | Freq: Every day | ORAL | Status: DC
Start: 2014-06-12 — End: 2014-08-09

## 2014-06-12 NOTE — ED Notes (Signed)
Declined W/C at D/C and was escorted to lobby by RN. 

## 2014-06-12 NOTE — ED Provider Notes (Signed)
CSN: [id number]     Arrival date & time 06/12/14  1008 History  This chart was scribed for non-physician practitioner, Clayton Bibles, PA-C working with Blanchie Dessert, MD by Judithann Sauger, ED Scribe. The patient was seen in room TR10C/TR10C and the patient's care was started at 10:49 AM    Chief Complaint  Patient presents with  . Hand Problem   The history is provided by the patient. No language interpreter was used.   HPI Comments: Travis [name] is a 59 y.o. male with a hx of gout, osteoarthritis, and DM who presents to the Emergency Department complaining of constant sharp left hand pain and hand swelling onset 2 days ago. Indicates that his pain is in his index finger, in 2nd MCP and PIP joints.  Has a hx of gout and rheumatoid arthritis and this feels similar to his previous flare ups.  He reports that the pain was radiating towards his left elbow last night. He reports associated diarrhea. He denies any injuries to the hand. He denies any fever, chills, CP, SOB, abdominal pain, and N/V. He reports that he took Ibuprofen with no relief. He reports that he usually gets his gout in his ankle.   Past Medical History  Diagnosis Date  . Systolic heart failure     EF 20-25% by echo 12/28/10  . Nonischemic cardiomyopathy     minimal coronary disease by cath 12/29/10  . Hemoptysis     secondary to pulmonary edema  . Hyperlipidemia   . Chronic anemia   . GI bleed     15 years ago  . Tobacco abuse   . Alcohol abuse   . Osteoarthritis   . Gout   . Diabetes mellitus, type 2     pt reports his DM is gone  . 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 Cardiac catheterization    . Hip arthroplasty Right 03/15/2013    Procedure: ARTHROPLASTY BIPOLAR HIP;  Surgeon: [name], MD;  Location: [facility];  Service: Orthopedics;  Laterality: Right;   Family History  Problem Relation Age of Onset  . Diabetes Mother   . Hypertension  Mother   . Diabetes Father   . Hypertension Father   . Cancer Neg Hx   . Heart disease Neg Hx   . Stroke Neg Hx    History  Substance Use Topics  . Smoking status: Current Every Day Smoker -- 0.75 packs/day for 30 years    Types: Cigarettes  . Smokeless tobacco: Never Used  . Alcohol Use: 0.6 oz/week    1 Glasses of wine per week     Comment: occasionally    Review of Systems  Constitutional: Negative for fever and chills.  HENT: Negative for rhinorrhea.   Respiratory: Negative for shortness of breath.   Cardiovascular: Negative for chest pain.  Gastrointestinal: Positive for diarrhea. Negative for nausea, vomiting and abdominal pain.  Musculoskeletal: Positive for joint swelling and arthralgias (left hand pain).  Neurological: Negative for headaches.      Allergies  Review of patient's allergies indicates no known allergies.  Home Medications   Prior to Admission medications   Medication Sig Start Date End Date Taking? Authorizing Provider  aspirin 325 MG EC tablet Take 325 mg by mouth daily.    Historical Provider, MD  atorvastatin (LIPITOR) 10 MG tablet Take 1 tablet (10 mg total) by mouth daily at 6 PM. 05/27/14   Amy Estrella Deeds, NP  carvedilol (COREG) 3.125 MG tablet Take 1 tablet (3.125 mg total) by mouth 2 (two) times daily with a meal. 05/27/14   Amy D Clegg, NP  colchicine 0.6 MG tablet Take 1 tablet (0.6 mg total) by mouth 2 (two) times daily. Patient not taking: Reported on 06/02/2014 02/20/14   Elmyra Ricks Pisciotta, PA-C  cyclobenzaprine (FLEXERIL) 10 MG tablet Take 1 tablet (10 mg total) by mouth 3 (three) times daily as needed for muscle spasms. 12/31/13   [name], NP  dicyclomine (BENTYL) 20 MG tablet  04/21/14   Historical Provider, MD  diphenoxylate-atropine (LOMOTIL) 2.5-0.025 MG per tablet Take 1 tablet by mouth 4 (four) times daily as needed for diarrhea or loose stools. 05/03/14   [name], NP  etodolac (LODINE) 500 MG tablet Take 1 tablet (500 mg total)  by mouth 2 (two) times daily. 03/31/14   Owens Loffler, MD  fenofibrate 54 MG tablet Take 1 tablet (54 mg total) by mouth daily. 05/27/14   Amy D Ninfa Meeker, NP  folic acid (FOLVITE) 1 MG tablet Take 1 tablet (1 mg total) by mouth daily. 05/27/14   Amy D Ninfa Meeker, NP  ibuprofen (ADVIL,MOTRIN) 800 MG tablet  03/28/14   Historical Provider, MD  lisinopril (PRINIVIL,ZESTRIL) 5 MG tablet  04/28/14   Historical Provider, MD  loperamide (IMODIUM) 2 MG capsule Take 2 mg by mouth as needed for diarrhea or loose stools.    Historical Provider, MD  Multiple Vitamin (ONE-A-DAY MENS PO) Take 1 tablet by mouth.    Historical Provider, MD  Omega-3 Fatty Acids (FISH OIL PO) Take 1 capsule by mouth daily.    Historical Provider, MD  ondansetron (ZOFRAN ODT) 4 MG disintegrating tablet Take 1 tablet (4 mg total) by mouth every 8 (eight) hours as needed for nausea or vomiting. 05/03/14   [name], NP  ondansetron [facility]) 4 MG tablet  04/26/14   Historical Provider, MD  oxyCODONE-acetaminophen (PERCOCET/ROXICET) 5-325 MG per tablet Take 1 tablet by mouth every 4 (four) hours as needed for severe pain. May take 2 tablets PO q 6 hours for severe pain - Do not take with Tylenol as this tablet already contains tylenol 04/26/14   Baron Sane, PA-C  thiamine 100 MG tablet Take 1 tablet (100 mg total) by mouth daily. 09/16/13   Thurnell Lose, MD   BP 148/92 mmHg  Pulse 95  Temp(Src) 97.8 F (36.6 C) (Oral)  Resp 18  Ht 6\' 4"  (1.93 m)  Wt 186 lb (84.369 kg)  BMI 22.65 kg/m2  SpO2 98% Physical Exam  Constitutional: He appears well-developed and well-nourished. No distress.  HENT:  Head: Normocephalic and atraumatic.  Neck: Neck supple.  Pulmonary/Chest: Effort normal.  Musculoskeletal:  Left hand with edema, warmth, tenderness with light tough to 2nd MCP and PIP joints, decresed ROM secondary to localized swelling of joint.  Left 2nd DIP without edema, full AROM, nontender.  No other bony tenderness, edema, erythema.   Sensation intact, capillary refill < seconds.  Left wrist and forearm without bony tenderness.  Slight tenderness over musculature of left forearm, radial aspect.  Elbow nontender.    Neurological: He is alert.  Skin: He is not diaphoretic.  Nursing note and vitals reviewed.   ED Course  Procedures (including critical care time) DIAGNOSTIC STUDIES: Oxygen Saturation is 98% on RA, normal by my interpretation.    COORDINATION OF CARE: 10:54 AM- Pt advised of plan for treatment and pt agrees.    Labs Review Labs Reviewed -  No data to display  Imaging Review No results found.   EKG Interpretation None      MDM   Final diagnoses:  Acute gout of left hand, unspecified cause  Arthritis    Afebrile, nontoxic patient with left 2nd MTP and PIP joint swelling, pain c/w prior gout flares.  DIP has full ROM.  No diffuse edema.  No injury.  No fevers.  Doubt septic joint.  Recent labs reviewed for this patient showing decreased GFR and normal blood glucose.   D/C home with prednisone, percocet, close PCP follow up ([place], per patient).   Discussed result, findings, treatment, and follow up  with patient.  Pt given return precautions.  Pt verbalizes understanding and agrees with plan.         I personally performed the services described in this documentation, which was scribed in my presence. The recorded information has been reviewed and is accurate.   Clayton Bibles, PA-C 06/12/14 1233  Blanchie Dessert, MD 06/12/14 1419

## 2014-06-12 NOTE — ED Notes (Signed)
Pt reports swelling and pain to left thumg and hand since yesterday morning. Denies injury.

## 2014-06-12 NOTE — Discharge Instructions (Signed)
Read the information below.  Use the prescribed medication as directed.  Please discuss all new medications with your pharmacist.  Do not take additional tylenol while taking the prescribed pain medication to avoid overdose.  You may return to the Emergency Department at any time for worsening condition or any new symptoms that concern you.  If you develop uncontrolled pain, weakness or numbness of the extremity, severe discoloration of the skin, or you are unable to use your hand, return to the ER for a recheck.      Gout Gout is when your joints become red, sore, and swell (inflamed). This is caused by the buildup of uric acid crystals in the joints. Uric acid is a chemical that is normally in the blood. If the level of uric acid gets too high in the blood, these crystals form in your joints and tissues. Over time, these crystals can form into masses near the joints and tissues. These masses can destroy bone and cause the bone to look misshapen (deformed). HOME CARE   Do not take aspirin for pain.  Only take medicine as told by your doctor.  Rest the joint as much as you can. When in bed, keep sheets and blankets off painful areas.  Keep the sore joints raised (elevated).  Put warm or cold packs on painful joints. Use of warm or cold packs depends on which works best for you.  Use crutches if the painful joint is in your leg.  Drink enough fluids to keep your pee (urine) clear or pale yellow. Limit alcohol, sugary drinks, and drinks with fructose in them.  Follow your diet instructions. Pay careful attention to how much protein you eat. Include fruits, vegetables, whole grains, and fat-free or low-fat milk products in your daily diet. Talk to your doctor or dietitian about the use of coffee, vitamin C, and cherries. These may help lower uric acid levels.  Keep a healthy body weight. GET HELP RIGHT AWAY IF:   You have watery poop (diarrhea), throw up (vomit), or have any side effects from  medicines.  You do not feel better in 24 hours, or you are getting worse.  Your joint becomes suddenly more tender, and you have chills or a fever. MAKE SURE YOU:   Understand these instructions.  Will watch your condition.  Will get help right away if you are not doing well or get worse. Document Released: 12/20/2007 Document Revised: 07/27/2013 Document Reviewed: 10/24/2011 [facility] Patient Information 2015 [place], [place]. This information is not intended to replace advice given to you by your health care provider. Make sure you discuss any questions you have with your health care provider.

## 2014-06-14 ENCOUNTER — Telehealth: Payer: Self-pay | Admitting: Internal Medicine

## 2014-06-14 NOTE — Telephone Encounter (Signed)
Pt called to let you know he went to  3/19 for gout He schedule follow up  For 4/4

## 2014-06-21 ENCOUNTER — Other Ambulatory Visit (HOSPITAL_COMMUNITY): Payer: Self-pay | Admitting: *Deleted

## 2014-06-21 MED ORDER — LISINOPRIL 5 MG PO TABS: 5.0000 mg | ORAL_TABLET | Freq: Every day | ORAL | Status: DC

## 2014-06-28 ENCOUNTER — Ambulatory Visit: Payer: Medicare Other | Admitting: Internal Medicine

## 2014-07-27 ENCOUNTER — Telehealth: Payer: Self-pay | Admitting: Family Medicine

## 2014-07-27 NOTE — Telephone Encounter (Signed)
Lodine is an NSAID like ibuprofen or alleve. This certainly has nothing to do with a narcotic contract violation. The equivalent would be ibuprofen 800 mg po TID. (multiple nsaids available OTC)  I will refill in this instance, but typically for long-term prescriptions, I will get the PCP involved.

## 2014-07-27 NOTE — Telephone Encounter (Signed)
Will defer to Dr. Lorelei Pont. I no longer prescribe any pain meds for this pt because of failing previous drug screens.

## 2014-07-27 NOTE — Telephone Encounter (Signed)
Last office visit 06/02/2014 with Dr. Lorelei Pont.  Last refilled 03/31/2014 for #60 with 3 refills.  Ok to refill?

## 2014-08-06 ENCOUNTER — Encounter (HOSPITAL_COMMUNITY): Payer: Self-pay | Admitting: *Deleted

## 2014-08-06 ENCOUNTER — Emergency Department (HOSPITAL_COMMUNITY)
Admission: EM | Admit: 2014-08-06 | Discharge: 2014-08-06 | Disposition: A | Discharge status: Home / Self Care | Payer: Medicare Other | Attending: Emergency Medicine | Admitting: Emergency Medicine

## 2014-08-06 DIAGNOSIS — E119 Type 2 diabetes mellitus without complications: Secondary | ICD-10-CM | POA: Insufficient documentation

## 2014-08-06 DIAGNOSIS — I5020 Unspecified systolic (congestive) heart failure: Secondary | ICD-10-CM | POA: Diagnosis not present

## 2014-08-06 DIAGNOSIS — Z7952 Long term (current) use of systemic steroids: Secondary | ICD-10-CM | POA: Insufficient documentation

## 2014-08-06 DIAGNOSIS — M1990 Unspecified osteoarthritis, unspecified site: Secondary | ICD-10-CM | POA: Insufficient documentation

## 2014-08-06 DIAGNOSIS — M109 Gout, unspecified: Secondary | ICD-10-CM | POA: Diagnosis not present

## 2014-08-06 DIAGNOSIS — Z720 Tobacco use: Secondary | ICD-10-CM | POA: Insufficient documentation

## 2014-08-06 DIAGNOSIS — Z7982 Long term (current) use of aspirin: Secondary | ICD-10-CM | POA: Insufficient documentation

## 2014-08-06 DIAGNOSIS — E785 Hyperlipidemia, unspecified: Secondary | ICD-10-CM | POA: Diagnosis not present

## 2014-08-06 DIAGNOSIS — Z791 Long term (current) use of non-steroidal anti-inflammatories (NSAID): Secondary | ICD-10-CM | POA: Diagnosis not present

## 2014-08-06 DIAGNOSIS — M5412 Radiculopathy, cervical region: Secondary | ICD-10-CM | POA: Diagnosis not present

## 2014-08-06 DIAGNOSIS — Z8719 Personal history of other diseases of the digestive system: Secondary | ICD-10-CM | POA: Diagnosis not present

## 2014-08-06 DIAGNOSIS — D649 Anemia, unspecified: Secondary | ICD-10-CM | POA: Diagnosis not present

## 2014-08-06 DIAGNOSIS — Z9889 Other specified postprocedural states: Secondary | ICD-10-CM | POA: Diagnosis not present

## 2014-08-06 DIAGNOSIS — M25512 Pain in left shoulder: Secondary | ICD-10-CM | POA: Diagnosis present

## 2014-08-06 MED ORDER — OXYCODONE-ACETAMINOPHEN 5-325 MG PO TABS: 1.0000 | ORAL_TABLET | ORAL | Status: DC | PRN

## 2014-08-06 MED ORDER — KETOROLAC TROMETHAMINE 60 MG/2ML IM SOLN
60.0000 mg | Freq: Once | INTRAMUSCULAR | Status: AC
  Administered 2014-08-06: 60 mg via INTRAMUSCULAR
  Filled 2014-08-06: qty 2

## 2014-08-06 NOTE — ED Provider Notes (Signed)
CSN: [id number]     Arrival date & time 08/06/14  1012 History   First MD Initiated Contact with Patient 08/06/14 1016     Chief Complaint  Patient presents with  . Shoulder Pain     (Consider location/radiation/quality/duration/timing/severity/associated sxs/prior Treatment) HPI Comments: Patient presents to the ER for evaluation of left shoulder pain. Patient reports that symptoms began yesterday. It worsened overnight. Patient denies injury. He reports a history of arthritis and gout. Patient states that he is having severe and constant, sharp and stabbing pain in the left shoulder that goes up into the left side of the neck and down the arm into the hand. Symptoms worsen with movement of the shoulder.  Patient is a 59 y.o. male presenting with shoulder pain.  Shoulder Pain   Past Medical History  Diagnosis Date  . Systolic heart failure     EF 20-25% by echo 12/28/10  . Nonischemic cardiomyopathy     minimal coronary disease by cath 12/29/10  . Hemoptysis     secondary to pulmonary edema  . Hyperlipidemia   . Chronic anemia   . GI bleed     15 years ago  . Tobacco abuse   . Alcohol abuse   . Osteoarthritis   . Gout   . Diabetes mellitus, type 2     pt reports his DM is gone  . 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 Cardiac catheterization    . Hip arthroplasty Right 03/15/2013    Procedure: ARTHROPLASTY BIPOLAR HIP;  Surgeon: [name], MD;  Location: [facility];  Service: Orthopedics;  Laterality: Right;   Family History  Problem Relation Age of Onset  . Diabetes Mother   . Hypertension Mother   . Diabetes Father   . Hypertension Father   . Cancer Neg Hx   . Heart disease Neg Hx   . Stroke Neg Hx    History  Substance Use Topics  . Smoking status: Current Every Day Smoker -- 0.75 packs/day for 30 years    Types: Cigarettes  . Smokeless tobacco: Never Used  . Alcohol Use: 0.6 oz/week    1 Glasses of  wine per week     Comment: occasionally    Review of Systems  Musculoskeletal: Positive for arthralgias.  All other systems reviewed and are negative.     Allergies  Review of patient's allergies indicates no known allergies.  Home Medications   Prior to Admission medications   Medication Sig Start Date End Date Taking? Authorizing Provider  aspirin 325 MG EC tablet Take 325 mg by mouth daily.    Historical Provider, MD  atorvastatin (LIPITOR) 10 MG tablet Take 1 tablet (10 mg total) by mouth daily at 6 PM. 05/27/14   Amy D Clegg, NP  carvedilol (COREG) 3.125 MG tablet Take 1 tablet (3.125 mg total) by mouth 2 (two) times daily with a meal. 05/27/14   Amy D Clegg, NP  colchicine 0.6 MG tablet Take 1 tablet (0.6 mg total) by mouth 2 (two) times daily. Patient not taking: Reported on 06/02/2014 02/20/14   Elmyra Ricks Pisciotta, PA-C  cyclobenzaprine (FLEXERIL) 10 MG tablet Take 1 tablet (10 mg total) by mouth 3 (three) times daily as needed for muscle spasms. 12/31/13   [name], NP  dicyclomine (BENTYL) 20 MG tablet  04/21/14   Historical Provider, MD  diphenoxylate-atropine (LOMOTIL) 2.5-0.025 MG per tablet Take 1 tablet by mouth 4 (four) times daily as  needed for diarrhea or loose stools. 05/03/14   [name], NP  etodolac (LODINE) 500 MG tablet TAKE 1 TABLET BY MOUTH TWICE A DAY 07/27/14   Owens Loffler, MD  fenofibrate 54 MG tablet Take 1 tablet (54 mg total) by mouth daily. 05/27/14   Amy D Ninfa Meeker, NP  folic acid (FOLVITE) 1 MG tablet Take 1 tablet (1 mg total) by mouth daily. 05/27/14   Amy D Ninfa Meeker, NP  ibuprofen (ADVIL,MOTRIN) 800 MG tablet  03/28/14   Historical Provider, MD  lisinopril (PRINIVIL,ZESTRIL) 5 MG tablet Take 1 tablet (5 mg total) by mouth daily. 06/21/14   Amy D Ninfa Meeker, NP  loperamide (IMODIUM) 2 MG capsule Take 2 mg by mouth as needed for diarrhea or loose stools.    Historical Provider, MD  Multiple Vitamin (ONE-A-DAY MENS PO) Take 1 tablet by mouth.    Historical Provider,  MD  Omega-3 Fatty Acids (FISH OIL PO) Take 1 capsule by mouth daily.    Historical Provider, MD  ondansetron (ZOFRAN ODT) 4 MG disintegrating tablet Take 1 tablet (4 mg total) by mouth every 8 (eight) hours as needed for nausea or vomiting. 05/03/14   [name], NP  ondansetron [facility]) 4 MG tablet  04/26/14   Historical Provider, MD  oxyCODONE-acetaminophen (PERCOCET/ROXICET) 5-325 MG per tablet Take 1-2 tablets by mouth every 4 (four) hours as needed for moderate pain or severe pain. 06/12/14   Clayton Bibles, PA-C  predniSONE (STERAPRED UNI-PAK) 10 MG tablet Take by mouth daily. Day 1: take 6 tabs.  Day 2: 5 tabs  Day 3: 4 tabs  Day 4: 3 tabs  Day 5: 2 tabs  Day 6: 1 tab 06/12/14   Clayton Bibles, PA-C  thiamine 100 MG tablet Take 1 tablet (100 mg total) by mouth daily. 09/16/13   Thurnell Lose, MD   BP 153/87 mmHg  Pulse 95  Temp(Src) 98.5 F (36.9 C) (Oral)  Resp 18  Ht 6\' 4"  (1.93 m)  Wt 185 lb (83.915 kg)  BMI 22.53 kg/m2  SpO2 99% Physical Exam  Constitutional: He is oriented to person, place, and time. He appears well-developed and well-nourished. No distress.  HENT:  Head: Normocephalic and atraumatic.  Right Ear: Hearing normal.  Left Ear: Hearing normal.  Nose: Nose normal.  Mouth/Throat: Oropharynx is clear and moist and mucous membranes are normal.  Eyes: Conjunctivae and EOM are normal. Pupils are equal, round, and reactive to light.  Neck: Normal range of motion. Neck supple.  Cardiovascular: Regular rhythm, S1 normal and S2 normal.  Exam reveals no gallop and no friction rub.   No murmur heard. Pulmonary/Chest: Effort normal and breath sounds normal. No respiratory distress. He exhibits no tenderness.  Abdominal: Soft. Normal appearance and bowel sounds are normal. There is no hepatosplenomegaly. There is no tenderness. There is no rebound, no guarding, no tenderness at McBurney's point and negative Murphy's sign. No hernia.  Musculoskeletal:       Left shoulder: He exhibits  decreased range of motion and tenderness. He exhibits no swelling and no deformity.  Neurological: He is alert and oriented to person, place, and time. He has normal strength. No cranial nerve deficit or sensory deficit. Coordination normal. GCS eye subscore is 4. GCS verbal subscore is 5. GCS motor subscore is 6.  Skin: Skin is warm, dry and intact. No rash noted. No cyanosis.  Psychiatric: He has a normal mood and affect. His speech is normal and behavior is normal. Thought content normal.  Nursing note and vitals reviewed.   ED Course  Procedures (including critical care time) Labs Review Labs Reviewed - No data to display  Imaging Review No results found.   EKG Interpretation None      MDM   Final diagnoses:  None  cervical radiculopathy Shoulder pain   Patient presents to the ER for evaluation of left shoulder pain. There does appear to be radicular component, pain worsens when he turns his head from side to side. Pain goes down his left arm and worsens with movement. He has mild tenderness in the anterior portion of the shoulder, but the maximal tenderness is superior and posterior. Patient has limited range of motion secondary to pain. Examination is very consistent with musculoskeletal pain. There is nothing to indicate cardiac or pulmonary causes for this pain. Patient will be treated with analgesia.    Orpah Greek, MD 08/06/14 1026

## 2014-08-06 NOTE — ED Notes (Signed)
Pt reports joint pain to left shoulder, arm, hands and fingers x 2 days. Denies injury. Pain increases when moving his arm.

## 2014-08-06 NOTE — Discharge Instructions (Signed)
Cervical Radiculopathy Cervical radiculopathy happens when a nerve in the neck is pinched or bruised by a slipped (herniated) disk or by arthritic changes in the bones of the cervical spine. This can occur due to an injury or as part of the normal aging process. Pressure on the cervical nerves can cause pain or numbness that runs from your neck all the way down into your arm and fingers. CAUSES  There are many possible causes, including:  Injury.  Muscle tightness in the neck from overuse.  Swollen, painful joints (arthritis).  Breakdown or degeneration in the bones and joints of the spine (spondylosis) due to aging.  Bone spurs that may develop near the cervical nerves. SYMPTOMS  Symptoms include pain, weakness, or numbness in the affected arm and hand. Pain can be severe or irritating. Symptoms may be worse when extending or turning the neck. DIAGNOSIS  Your caregiver will ask about your symptoms and do a physical exam. He or she may test your strength and reflexes. X-rays, CT scans, and MRI scans may be needed in cases of injury or if the symptoms do not go away after a period of time. Electromyography (EMG) or nerve conduction testing may be done to study how your nerves and muscles are working. TREATMENT  Your caregiver may recommend certain exercises to help relieve your symptoms. Cervical radiculopathy can, and often does, get better with time and treatment. If your problems continue, treatment options may include:  Wearing a soft collar for short periods of time.  Physical therapy to strengthen the neck muscles.  Medicines, such as nonsteroidal anti-inflammatory drugs (NSAIDs), oral corticosteroids, or spinal injections.  Surgery. Different types of surgery may be done depending on the cause of your problems. HOME CARE INSTRUCTIONS   Put ice on the affected area.  Put ice in a plastic bag.  Place a towel between your skin and the bag.  Leave the ice on for 15-20 minutes,  03-04 times a day or as directed by your caregiver.  If ice does not help, you can try using heat. Take a warm shower or bath, or use a hot water bottle as directed by your caregiver.  You may try a gentle neck and shoulder massage.  Use a flat pillow when you sleep.  Only take over-the-counter or prescription medicines for pain, discomfort, or fever as directed by your caregiver.  If physical therapy was prescribed, follow your caregiver's directions.  If a soft collar was prescribed, use it as directed. SEEK IMMEDIATE MEDICAL CARE IF:   Your pain gets much worse and cannot be controlled with medicines.  You have weakness or numbness in your hand, arm, face, or leg.  You have a high fever or a stiff, rigid neck.  You lose bowel or bladder control (incontinence).  You have trouble with walking, balance, or speaking. MAKE SURE YOU:   Understand these instructions.  Will watch your condition.  Will get help right away if you are not doing well or get worse. Document Released: 12/05/2000 Document Revised: 06/04/2011 Document Reviewed: 10/24/2010 [name] Patient Information 2015 [place], [place]. This information is not intended to replace advice given to you by your health care provider. Make sure you discuss any questions you have with your health care provider. Shoulder Pain The shoulder is the joint that connects your arms to your body. The bones that form the shoulder joint include the upper arm bone (humerus), the shoulder blade (scapula), and the collarbone (clavicle). The top of the humerus is shaped  like a ball and fits into a rather flat socket on the scapula (glenoid cavity). A combination of muscles and strong, fibrous tissues that connect muscles to bones (tendons) support your shoulder joint and hold the ball in the socket. Small, fluid-filled sacs (bursae) are located in different areas of the joint. They act as cushions between the bones and the overlying soft tissues  and help reduce friction between the gliding tendons and the bone as you move your arm. Your shoulder joint allows a wide range of motion in your arm. This range of motion allows you to do things like scratch your back or throw a ball. However, this range of motion also makes your shoulder more prone to pain from overuse and injury. Causes of shoulder pain can originate from both injury and overuse and usually can be grouped in the following four categories:  Redness, swelling, and pain (inflammation) of the tendon (tendinitis) or the bursae (bursitis).  Instability, such as a dislocation of the joint.  Inflammation of the joint (arthritis).  Broken bone (fracture). HOME CARE INSTRUCTIONS   Apply ice to the sore area.  Put ice in a plastic bag.  Place a towel between your skin and the bag.  Leave the ice on for 15-20 minutes, 3-4 times per day for the first 2 days, or as directed by your health care provider.  Stop using cold packs if they do not help with the pain.  If you have a shoulder sling or immobilizer, wear it as long as your caregiver instructs. Only remove it to shower or bathe. Move your arm as little as possible, but keep your hand moving to prevent swelling.  Squeeze a soft ball or foam pad as much as possible to help prevent swelling.  Only take over-the-counter or prescription medicines for pain, discomfort, or fever as directed by your caregiver. SEEK MEDICAL CARE IF:   Your shoulder pain increases, or new pain develops in your arm, hand, or fingers.  Your hand or fingers become cold and numb.  Your pain is not relieved with medicines. SEEK IMMEDIATE MEDICAL CARE IF:   Your arm, hand, or fingers are numb or tingling.  Your arm, hand, or fingers are significantly swollen or turn white or blue. MAKE SURE YOU:   Understand these instructions.  Will watch your condition.  Will get help right away if you are not doing well or get worse. Document Released:  12/20/2004 Document Revised: 07/27/2013 Document Reviewed: 02/24/2011 [facility] Patient Information 2015 [place], [place]. This information is not intended to replace advice given to you by your health care provider. Make sure you discuss any questions you have with your health care provider.

## 2014-08-08 ENCOUNTER — Emergency Department
Admission: EM | Admit: 2014-08-08 | Discharge: 2014-08-08 | Disposition: A | Discharge status: Home / Self Care | Payer: Medicare Other | Attending: Emergency Medicine | Admitting: Emergency Medicine

## 2014-08-08 ENCOUNTER — Encounter: Payer: Self-pay | Admitting: Emergency Medicine

## 2014-08-08 DIAGNOSIS — Z7982 Long term (current) use of aspirin: Secondary | ICD-10-CM | POA: Diagnosis not present

## 2014-08-08 DIAGNOSIS — M1A0420 Idiopathic chronic gout, left hand, without tophus (tophi): Secondary | ICD-10-CM | POA: Insufficient documentation

## 2014-08-08 DIAGNOSIS — Z79899 Other long term (current) drug therapy: Secondary | ICD-10-CM | POA: Insufficient documentation

## 2014-08-08 DIAGNOSIS — M79642 Pain in left hand: Secondary | ICD-10-CM | POA: Diagnosis present

## 2014-08-08 DIAGNOSIS — M19049 Primary osteoarthritis, unspecified hand: Secondary | ICD-10-CM

## 2014-08-08 DIAGNOSIS — M19042 Primary osteoarthritis, left hand: Secondary | ICD-10-CM | POA: Diagnosis not present

## 2014-08-08 DIAGNOSIS — E119 Type 2 diabetes mellitus without complications: Secondary | ICD-10-CM | POA: Insufficient documentation

## 2014-08-08 DIAGNOSIS — M1990 Unspecified osteoarthritis, unspecified site: Secondary | ICD-10-CM | POA: Diagnosis not present

## 2014-08-08 MED ORDER — OXYCODONE-ACETAMINOPHEN 5-325 MG PO TABS
ORAL_TABLET | ORAL | Status: AC
  Administered 2014-08-08: 1 via ORAL
  Filled 2014-08-08: qty 1

## 2014-08-08 MED ORDER — OXYCODONE-ACETAMINOPHEN 5-325 MG PO TABS
1.0000 | ORAL_TABLET | Freq: Once | ORAL | Status: AC
  Administered 2014-08-08: 1 via ORAL

## 2014-08-08 NOTE — ED Notes (Signed)
Pt reports being seen at [facility] on Friday with pulled muscle to left shoulder. Pt states for past 2 days he has had swelling swelling in hand and fingers. Painful joints per pt. Lt states history of arthritis and gout.

## 2014-08-08 NOTE — ED Notes (Signed)
NAD noted at time of D/C. Pt denies questions or concerns. Pt ambulatory to the lobby at this time.  

## 2014-08-08 NOTE — ED Provider Notes (Signed)
[facility] Emergency Department Provider Note ?____________________________________________ ? Time seen: 1130 ? I have reviewed the triage vital signs and the nursing notes. ________ HISTORY ? Chief Complaint Hand Pain  HPI  Travis [name] is a 59 y.o. male who reports to the ED for evaluation of shoulder pain and hand pain on the left. He was seen at [facility] on Friday and treated for nerve impingement in the neck, with an injection he reports. He is here for evaluation and treatment of his continued shoulder pain, and right hand pain with swelling without interim injury. He has a history of gout and arthritis, and reports that he takes ibuprofen daily for his gout and arthritis pain. He does admit to being prescribed Percocet on Friday, #15, and claims he only has a couple pills left today.  Past Medical History  Diagnosis Date  . Systolic heart failure     EF 20-25% by echo 12/28/10  . Nonischemic cardiomyopathy     minimal coronary disease by cath 12/29/10  . Hemoptysis     secondary to pulmonary edema  . Hyperlipidemia   . Chronic anemia   . GI bleed     15 years ago  . Tobacco abuse   . Alcohol abuse   . Osteoarthritis   . Gout   . Diabetes mellitus, type 2     pt reports his DM is gone  . Alcoholic cardiomyopathy [id number]   Patient Active Problem List   Diagnosis Date Noted  . Literacy level of illiterate 06/04/2014  . Noncompliance 10/22/2013  . 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 Cardiomyopathy, nonischemic 03/15/2013  . Smoker 03/14/2013  . Alcoholism in remission 03/14/2013  . Chronic systolic heart failure [id number]  . DM2 (diabetes mellitus, type 2) 11/30/2008  . HLD (hyperlipidemia) 11/30/2008  . GOUT 11/30/2008  . Iron deficiency anemia, unspecified 11/30/2008  ? Past Surgical History  Procedure Laterality Date  . Cardiac catheterization    . Hip arthroplasty Right 03/15/2013    Procedure: ARTHROPLASTY  BIPOLAR HIP;  Surgeon: [name], MD;  Location: [facility];  Service: Orthopedics;  Laterality: Right;  ? Current Outpatient Rx  Name  Route  Sig  Dispense  Refill  . aspirin 325 MG EC tablet   Oral   Take 325 mg by mouth daily.         [name] atorvastatin (LIPITOR) 10 MG tablet   Oral   Take 1 tablet (10 mg total) by mouth daily at 6 PM.   30 tablet   6   . carvedilol (COREG) 3.125 MG tablet   Oral   Take 1 tablet (3.125 mg total) by mouth 2 (two) times daily with a meal.   60 tablet   6   . colchicine 0.6 MG tablet   Oral   Take 1 tablet (0.6 mg total) by mouth 2 (two) times daily. Patient not taking: Reported on 06/02/2014   12 tablet   0   . cyclobenzaprine (FLEXERIL) 10 MG tablet   Oral   Take 1 tablet (10 mg total) by mouth 3 (three) times daily as needed for muscle spasms.   30 tablet   0   . dicyclomine (BENTYL) 20 MG tablet               . diphenoxylate-atropine (LOMOTIL) 2.5-0.025 MG per tablet   Oral   Take 1 tablet by mouth 4 (four) times daily as needed for diarrhea or loose stools.  30 tablet   0   . etodolac (LODINE) 500 MG tablet      TAKE 1 TABLET BY MOUTH TWICE A DAY   60 tablet   2   . fenofibrate 54 MG tablet   Oral   Take 1 tablet (54 mg total) by mouth daily.   30 tablet   6   . folic acid (FOLVITE) 1 MG tablet   Oral   Take 1 tablet (1 mg total) by mouth daily.   30 tablet   6   . ibuprofen (ADVIL,MOTRIN) 800 MG tablet            0   . lisinopril (PRINIVIL,ZESTRIL) 5 MG tablet   Oral   Take 1 tablet (5 mg total) by mouth daily.   30 tablet   3   . loperamide (IMODIUM) 2 MG capsule   Oral   Take 2 mg by mouth as needed for diarrhea or loose stools.         . Multiple Vitamin (ONE-A-DAY MENS PO)   Oral   Take 1 tablet by mouth.         . Omega-3 Fatty Acids (FISH OIL PO)   Oral   Take 1 capsule by mouth daily.         . ondansetron (ZOFRAN ODT) 4 MG disintegrating tablet   Oral   Take 1 tablet (4  mg total) by mouth every 8 (eight) hours as needed for nausea or vomiting.   20 tablet   0   . ondansetron (ZOFRAN) 4 MG tablet               . oxyCODONE-acetaminophen (PERCOCET) 5-325 MG per tablet   Oral   Take 1-2 tablets by mouth every 4 (four) hours as needed.   15 tablet   0   . predniSONE (STERAPRED UNI-PAK) 10 MG tablet   Oral   Take by mouth daily. Day 1: take 6 tabs.  Day 2: 5 tabs  Day 3: 4 tabs  Day 4: 3 tabs  Day 5: 2 tabs  Day 6: 1 tab   21 tablet   0   . thiamine 100 MG tablet   Oral   Take 1 tablet (100 mg total) by mouth daily.   30 tablet   0    Allergies Review of patient's allergies indicates no known allergies. ? Family History  Problem Relation Age of Onset  . Diabetes Mother   . Hypertension Mother   . Diabetes Father   . Hypertension Father   . Cancer Neg Hx   . Heart disease Neg Hx   . Stroke Neg Hx   ? Social History History  Substance Use Topics  . Smoking status: Current Every Day Smoker -- 1.00 packs/day for 30 years    Types: Cigarettes  . Smokeless tobacco: Never Used  . Alcohol Use: 0.6 oz/week    1 Glasses of wine per week     Comment: occasionally   Review of Systems  Constitutional: Negative for fever. HEENT: Negative for head trauma, visual changes, sore throat. Cardiovascular: Negative for chest pain. Respiratory: Negative for shortness of breath. Musculoskeletal: Negative for back pain. Positive for left shoulder and hand pain. Skin: Negative for rash. Neurological: Negative for headaches, focal weakness or numbness.  10-point ROS otherwise negative. ____________________________________________  PHYSICAL EXAM:  VITAL SIGNS: ED Triage Vitals  Enc Vitals Group     BP 08/08/14 1027 174/100 mmHg     Pulse Rate  08/08/14 1027 99     Resp 08/08/14 1027 18     Temp 08/08/14 1027 98.1 F (36.7 C)     Temp Source 08/08/14 1027 Oral     SpO2 08/08/14 1027 98 %     Weight 08/08/14 1027 186 lb (84.369 kg)      Height 08/08/14 1027 6\' 4"  (1.93 m)     Head Cir --      Peak Flow --      Pain Score 08/08/14 1036 10     Pain Loc --      Pain Edu? --      Excl. in [place]? --     Constitutional: Alert and oriented. Well appearing and in no distress. HEENT:Normocephalic and atraumatic.  PERRL. Normal extraocular movements.  No congestion/rhinnorhea. Mucous membranes are moist. Neck: Supple. No cervical lymphadenopathy. Cardiovascular: Normal rate, regular rhythm. No murmurs, rubs, or gallops. Normal and symmetric distal pulses are present in all extremities.  Respiratory: Normal respiratory effort without tachypnea. Breath sounds are clear and equal bilaterally. No wheezes/rales/rhonchi. Gastrointestinal: Soft and nontender. No distention. No abdominal bruits. There is no CVA tenderness. Musculoskeletal: Patient with dorsal right hand swelling from the wrist to the fingers, he has normal composite fist demonstration. Skin is without erythema, ecchymosis, laceration or abrasion. Patient with normal pronation supination of the wrist and elbow joints. Chronic changes to the hand consistent with his history of gout and osteoarthritis. Normal full range of motion of the left shoulder as demonstrated on exam. No joint effusions.   Neurologic:  Normal speech and language. CN II-XII grossly intact. No gait instability. Skin:  Skin is warm, dry and intact. No rash noted. Psychiatric: Mood and affect are normal. Patient exhibits appropriate insight and judgment. _____________ PROCEDURES ? Procedure(s) performed: Percocet 5/325 mg PO x 1  Critical Care performed: none ______________________________________________________ INITIAL IMPRESSION / ASSESSMENT AND PLAN / ED COURSE ? Probable gout flare of left hand.  Chronic OA of the hand.  Musculoskeletal shoulder pain. Suggest ice, anti-inflammatories, and pain medicines as previously prescribed.  Follow-up with [facility] for continue management. No prescription meds given  after review of his current and acute meds from Friday (2 days PTA).  Pertinent labs & imaging results that were available during my care of the patient were reviewed by me and considered in my medical decision making (see chart for details). ____________________________________________ FINAL CLINICAL IMPRESSION(S) / ED DIAGNOSES?  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

## 2014-08-08 NOTE — Discharge Instructions (Signed)
Arthritis, Nonspecific Arthritis is pain, redness, warmth, or puffiness (inflammation) of a joint. The joint may be stiff or hurt when you move it. One or more joints may be affected. There are many types of arthritis. Your doctor may not know what type you have right away. The most common cause of arthritis is wear and tear on the joint (osteoarthritis). HOME CARE   Only take medicine as told by your doctor.  Rest the joint as much as possible.  Raise (elevate) your joint if it is puffy.  Use crutches if the painful joint is in your leg.  Drink enough fluids to keep your pee (urine) clear or pale yellow.  Follow your doctor's diet instructions.  Use cold packs for very bad joint pain for 10 to 15 minutes every hour. Ask your doctor if it is okay for you to use hot packs.  Exercise as told by your doctor.  Take a warm shower if you have stiffness in the morning.  Move your sore joints throughout the day. GET HELP RIGHT AWAY IF:   You have a fever.  You have very bad joint pain, puffiness, or redness.  You have many joints that are painful and puffy.  You are not getting better with treatment.  You have very bad back pain or leg weakness.  You cannot control when you poop (bowel movement) or pee (urinate).  You do not feel better in 24 hours or are getting worse.  You are having side effects from your medicine. MAKE SURE YOU:   Understand these instructions.  Will watch your condition.  Will get help right away if you are not doing well or get worse. Document Released: 06/06/2009 Document Revised: 09/11/2011 Document Reviewed: 06/06/2009 [facility] Patient Information 2015 [place], [place]. This information is not intended to replace advice given to you by your health care provider. Make sure you discuss any questions you have with your health care provider.   Gout Gout is when your joints become red, sore, and swell (inflamed). This is caused by the buildup of uric  acid crystals in the joints. Uric acid is a chemical that is normally in the blood. If the level of uric acid gets too high in the blood, these crystals form in your joints and tissues. Over time, these crystals can form into masses near the joints and tissues. These masses can destroy bone and cause the bone to look misshapen (deformed). HOME CARE   Do not take aspirin for pain.  Only take medicine as told by your doctor.  Rest the joint as much as you can. When in bed, keep sheets and blankets off painful areas.  Keep the sore joints raised (elevated).  Put warm or cold packs on painful joints. Use of warm or cold packs depends on which works best for you.  Use crutches if the painful joint is in your leg.  Drink enough fluids to keep your pee (urine) clear or pale yellow. Limit alcohol, sugary drinks, and drinks with fructose in them.  Follow your diet instructions. Pay careful attention to how much protein you eat. Include fruits, vegetables, whole grains, and fat-free or low-fat milk products in your daily diet. Talk to your doctor or dietitian about the use of coffee, vitamin C, and cherries. These may help lower uric acid levels.  Keep a healthy body weight. GET HELP RIGHT AWAY IF:   You have watery poop (diarrhea), throw up (vomit), or have any side effects from medicines.  You do  not feel better in 24 hours, or you are getting worse.  Your joint becomes suddenly more tender, and you have chills or a fever. MAKE SURE YOU:   Understand these instructions.  Will watch your condition.  Will get help right away if you are not doing well or get worse. Document Released: 12/20/2007 Document Revised: 07/27/2013 Document Reviewed: 10/24/2011 [facility] Patient Information 2015 [place], [place]. This information is not intended to replace advice given to you by your health care provider. Make sure you discuss any questions you have with your health care provider.  Take the  prescription meds as directed.  Follow-up with your provider for ongoing treatment. You appear to be having a flare of your gout and osteoarthritis pain. Continue your Percocet and daily Lodine for pain as previously prescribed.

## 2014-08-09 ENCOUNTER — Ambulatory Visit (INDEPENDENT_AMBULATORY_CARE_PROVIDER_SITE_OTHER)
Admission: RE | Admit: 2014-08-09 | Discharge: 2014-08-09 | Disposition: A | Discharge status: Home / Self Care | Payer: Medicare Other | Source: Ambulatory Visit | Attending: Internal Medicine | Admitting: Internal Medicine

## 2014-08-09 ENCOUNTER — Ambulatory Visit (INDEPENDENT_AMBULATORY_CARE_PROVIDER_SITE_OTHER): Payer: Medicare Other | Admitting: Internal Medicine

## 2014-08-09 ENCOUNTER — Encounter: Payer: Self-pay | Admitting: Internal Medicine

## 2014-08-09 VITALS — BP 144/86 | HR 104 | Temp 98.8°F | Wt 182.0 lb

## 2014-08-09 DIAGNOSIS — M79642 Pain in left hand: Secondary | ICD-10-CM

## 2014-08-09 DIAGNOSIS — M79609 Pain in unspecified limb: Secondary | ICD-10-CM | POA: Diagnosis not present

## 2014-08-09 DIAGNOSIS — M7989 Other specified soft tissue disorders: Secondary | ICD-10-CM

## 2014-08-09 DIAGNOSIS — M19042 Primary osteoarthritis, left hand: Secondary | ICD-10-CM | POA: Diagnosis not present

## 2014-08-09 DIAGNOSIS — M1812 Unilateral primary osteoarthritis of first carpometacarpal joint, left hand: Secondary | ICD-10-CM | POA: Diagnosis not present

## 2014-08-09 LAB — URIC ACID: URIC ACID, SERUM: 5.4 mg/dL (ref 4.0–7.8)

## 2014-08-09 LAB — SEDIMENTATION RATE: Sed Rate: 82 mm/hr — ABNORMAL HIGH (ref 0–22)

## 2014-08-09 LAB — RHEUMATOID FACTOR

## 2014-08-09 MED ORDER — METHYLPREDNISOLONE ACETATE 80 MG/ML IJ SUSP
80.0000 mg | Freq: Once | INTRAMUSCULAR | Status: AC
  Administered 2014-08-09: 80 mg via INTRAMUSCULAR

## 2014-08-09 MED ORDER — OXYCODONE-ACETAMINOPHEN 5-325 MG PO TABS
1.0000 | ORAL_TABLET | Freq: Four times a day (QID) | ORAL | Status: DC | PRN
Start: 2014-08-09 — End: 2014-08-16

## 2014-08-09 MED ORDER — PREDNISONE 20 MG PO TABS: ORAL_TABLET | ORAL | Status: DC

## 2014-08-09 NOTE — Progress Notes (Addendum)
Subjective:    Patient ID: Travis [name], male    DOB: [birth year], 59 y.o.   MRN: [id number]  HPI  Pt presents to the clinic today to follow up [facility] and [place]. He went to [facility] ER 08/06/14. He was diagnosed with a muscle strain of the left shoulder. He was given Percocet. He then went to [facility] ER 08/08/14 with c/o continue left shoulder pain and swelling to the left arm and left hand. He reports they advised him to continue with his pain medication given to him 2 days prioro and follow up with his PCP. He reports the pain is excruciating. The pain medication is not helping. He denies any injury to the area. He does have a history of gout but can not tell me if this feels the same. He denies tingling in the arm but reports it does feel numb. He has not tried anything OTC for this. Xray of left shoulder from 01/2014 reviewed- normal.  Review of Systems      Past Medical History  Diagnosis Date  . Systolic heart failure     EF 20-25% by echo 12/28/10  . Nonischemic cardiomyopathy     minimal coronary disease by cath 12/29/10  . Hemoptysis     secondary to pulmonary edema  . Hyperlipidemia   . Chronic anemia   . GI bleed     15 years ago  . Tobacco abuse   . Alcohol abuse   . Osteoarthritis   . Gout   . Diabetes mellitus, type 2     pt reports his DM is gone  . Alcoholic cardiomyopathy 51/88/4166    Current Outpatient Prescriptions  Medication Sig Dispense Refill  . aspirin 325 MG EC tablet Take 325 mg by mouth daily.    [name] atorvastatin (LIPITOR) 10 MG tablet Take 1 tablet (10 mg total) by mouth daily at 6 PM. 30 tablet 6  . carvedilol (COREG) 3.125 MG tablet Take 1 tablet (3.125 mg total) by mouth 2 (two) times daily with a meal. 60 tablet 6  . colchicine 0.6 MG tablet Take 1 tablet (0.6 mg total) by mouth 2 (two) times daily. 12 tablet 0  . cyclobenzaprine (FLEXERIL) 10 MG tablet Take 1 tablet (10 mg total) by mouth 3 (three) times daily as needed for muscle spasms. 30 tablet  0  . dicyclomine (BENTYL) 20 MG tablet     . diphenoxylate-atropine (LOMOTIL) 2.5-0.025 MG per tablet Take 1 tablet by mouth 4 (four) times daily as needed for diarrhea or loose stools. 30 tablet 0  . etodolac (LODINE) 500 MG tablet TAKE 1 TABLET BY MOUTH TWICE A DAY 60 tablet 2  . fenofibrate 54 MG tablet Take 1 tablet (54 mg total) by mouth daily. 30 tablet 6  . folic acid (FOLVITE) 1 MG tablet Take 1 tablet (1 mg total) by mouth daily. 30 tablet 6  . ibuprofen (ADVIL,MOTRIN) 800 MG tablet   0  . lisinopril (PRINIVIL,ZESTRIL) 5 MG tablet Take 1 tablet (5 mg total) by mouth daily. 30 tablet 3  . loperamide (IMODIUM) 2 MG capsule Take 2 mg by mouth as needed for diarrhea or loose stools.    . Multiple Vitamin (ONE-A-DAY MENS PO) Take 1 tablet by mouth.    . Omega-3 Fatty Acids (FISH OIL PO) Take 1 capsule by mouth daily.    . ondansetron (ZOFRAN ODT) 4 MG disintegrating tablet Take 1 tablet (4 mg total) by mouth every 8 (eight) hours as needed for  nausea or vomiting. 20 tablet 0  . ondansetron (ZOFRAN) 4 MG tablet     . oxyCODONE-acetaminophen (PERCOCET) 5-325 MG per tablet Take 1-2 tablets by mouth every 4 (four) hours as needed. 15 tablet 0  . thiamine 100 MG tablet Take 1 tablet (100 mg total) by mouth daily. 30 tablet 0   No current facility-administered medications for this visit.    No Known Allergies  Family History  Problem Relation Age of Onset  . Diabetes Mother   . Hypertension Mother   . Diabetes Father   . Hypertension Father   . Cancer Neg Hx   . Heart disease Neg Hx   . Stroke Neg Hx     History   Social History  . Marital Status: Single    Spouse Name: N/A  . Number of Children: N/A  . Years of Education: N/A   Occupational History  . Not on file.   Social History Main Topics  . Smoking status: Current Every Day Smoker -- 1.00 packs/day for 30 years    Types: Cigarettes  . Smokeless tobacco: Never Used  . Alcohol Use: 0.6 oz/week    1 Glasses of wine  per week     Comment: occasionally  . Drug Use: Yes    Special: Marijuana  . Sexual Activity: Yes   Other Topics Concern  . Not on file   Social History Narrative     Constitutional: Denies fever, malaise, fatigue, headache or abrupt weight changes.  Respiratory: Denies difficulty breathing, shortness of breath, cough or sputum production.   Cardiovascular: Denies chest pain, chest tightness, palpitations or swelling in the hands or feet.  Musculoskeletal: Pt reports left hand pain and swelling. Denies decrease in range of motion, difficulty with gait, muscle pain.  Skin: Denies redness, rashes, lesions or ulcercations.  Neurological: Denies dizziness, difficulty with memory, difficulty with speech or problems with balance and coordination.   No other specific complaints in a complete review of systems (except as listed in HPI above).  Objective:   Physical Exam   BP 144/86 mmHg  Pulse 104  Temp(Src) 98.8 F (37.1 C) (Oral)  Wt 182 lb (82.555 kg)  SpO2 98% Wt Readings from Last 3 Encounters:  08/09/14 182 lb (82.555 kg)  08/08/14 186 lb (84.369 kg)  08/06/14 185 lb (83.915 kg)    General: Appears his stated age, well developed, well nourished in NAD. Skin: Warm, dry and intact. No redness or warmth noted of the left hand. Cardiovascular: Tachycardic with normal rhythm. S1,S2 noted.  No murmur, rubs or gallops noted. Radial pulse 2+ bilaterally. Pulmonary/Chest: Normal effort and positive vesicular breath sounds. No respiratory distress. No wheezes, rales or ronchi noted.  Musculoskeletal: Decreased flexion, extension and rotation of the left wrist and fingers. 2 + swelling noted of the left hand. Grip strength unequal, R>L. Neurological: Alert and oriented. Sensation intact to BLE.  BMET    Component Value Date/Time   NA 140 05/03/2014 1354   NA 141 04/20/2014 1019   NA 145* 02/25/2012 1409   K 4.1 05/03/2014 1354   K 3.7 04/20/2014 1019   CL 107 05/03/2014 1354     CL 106 04/20/2014 1019   CO2 29 05/03/2014 1354   CO2 30 04/20/2014 1019   GLUCOSE 91 05/03/2014 1354   GLUCOSE 102* 04/20/2014 1019   GLUCOSE 87 02/25/2012 1409   BUN 16 05/03/2014 1354   BUN 14 04/20/2014 1019   BUN 11 02/25/2012 1409   CREATININE 1.29  05/03/2014 1354   CREATININE 1.18 04/20/2014 1019   CALCIUM 9.3 05/03/2014 1354   CALCIUM 9.3 04/20/2014 1019   GFRNONAA 32* 04/26/2014 1159   GFRNONAA >60 11/19/2012 1107   GFRAA 37* 04/26/2014 1159   GFRAA >60 11/19/2012 1107    Lipid Panel     Component Value Date/Time   CHOL 153 12/31/2013 1403   TRIG 221.0* 12/31/2013 1403   HDL 28.70* 12/31/2013 1403   CHOLHDL 5 12/31/2013 1403   VLDL 44.2* 12/31/2013 1403   LDLCALC UNABLE TO CALCULATE IF TRIGLYCERIDE OVER 400 mg/dL 09/14/2013 0510    CBC    Component Value Date/Time   WBC 5.8 04/26/2014 1159   WBC 7.9 04/20/2014 1019   RBC 4.06* 04/26/2014 1159   RBC 4.65 04/20/2014 1019   RBC 2.93* 12/28/2010 0345   HGB 12.2* 04/26/2014 1159   HGB 13.5 04/20/2014 1019   HCT 36.4* 04/26/2014 1159   HCT 41.8 04/20/2014 1019   PLT 333 04/26/2014 1159   PLT 348 04/20/2014 1019   MCV 89.7 04/26/2014 1159   MCV 90 04/20/2014 1019   MCH 30.0 04/26/2014 1159   MCH 29.1 04/20/2014 1019   MCHC 33.5 04/26/2014 1159   MCHC 32.4 04/20/2014 1019   RDW 15.0 04/26/2014 1159   RDW 16.2* 04/20/2014 1019   LYMPHSABS 2.2 04/26/2014 1159   LYMPHSABS 2.1 04/20/2014 1019   MONOABS 0.5 04/26/2014 1159   MONOABS 0.5 04/20/2014 1019   EOSABS 0.8* 04/26/2014 1159   EOSABS 0.3 04/20/2014 1019   BASOSABS 0.0 04/26/2014 1159   BASOSABS 0.1 04/20/2014 1019   BASOSABS 0 06/13/2012 1515    Hgb A1C Lab Results  Component Value Date   HGBA1C 6.6* 12/31/2013        Assessment & Plan:   Pain and swelling of left hand:  [facility] and [place] records reviewed Shoulder xray from 01/2014 reviewed 80 mg Depo IM today eRx for pred taper x 6 days Refilled Percocet this time only Will obtain  xray of left hand Will check uric acid, ESR, ANA, and RF today  Will follow up after labs are back, RTC as needed

## 2014-08-09 NOTE — Patient Instructions (Signed)
Metacarpal Fractures Fractures of metacarpals are breaks in the bones of the hand. They extend from the knuckles to the wrist. These bones can break in many ways. There are different ways of treating these fractures. HOME CARE  Only exercise as told by your doctor.  Return to activities as told by your doctor.  Go to physical therapy as told by your doctor.  Follow your doctor's advice about driving.  Keep the injured hand raised (elevated) above the level of your heart.  If a plaster, fiberglass, or pre-formed splint was applied:  Wear your splint as told and until you are examined again.  Apply ice on the injury for 15-20 minutes at a time, 03-04 times a day. Put the ice in a plastic bag. Place a towel between your skin and the bag.  Do not get your splint or cast wet. Protect it during bathing with a plastic bag.  Loosen the elastic bandage around the splint if your fingers start to get numb, tingle, get cold, or turn blue.  If the splint is plaster, do not lean it on hard surfaces or put pressure on it for 24 hours after it is put on.  Do not  try to scratch the skin under the cast.  Check the skin around the cast every day. You may put lotion on red or sore areas.  Move the fingers of your casted hand several times a day.  Only take medicine as told by your doctor.  Follow up as told by your doctor. This is very important in order to avoid permanent injury, disability, or lasting (chronic) pain. GET HELP RIGHT AWAY IF:   You develop a rash.  You have problems breathing.  You have any allergy problems.  You have more than a small spot of blood from beneath your cast or splint.  You have redness, puffiness (swelling), or more pain from beneath your cast or splint.  Yellowish-white fluid (pus) comes from beneath your cast or splint.  You develop a temperature by mouth above 102 F (38.9 C), not controlled by medicine.  You have a bad smell coming from under your  cast or splint.  You have problems moving any of your fingers. If you do not have a window in your cast for looking at the wound, a fluid or a little bleeding may show up as a stain on the outside of your cast. Tell your doctor about any stains you see. MAKE SURE YOU:   Understand these instructions.  Will watch your condition.  Will get help right away if you are not doing well or get worse. Document Released: 08/29/2007 Document Revised: 07/27/2013 Document Reviewed: 02/15/2009 [facility] Patient Information 2015 [place], [place]. This information is not intended to replace advice given to you by your health care provider. Make sure you discuss any questions you have with your health care provider.

## 2014-08-09 NOTE — Addendum Note (Signed)
Addended by: Lurlean Nanny on: 08/09/2014 01:52 PM   Modules accepted: Orders

## 2014-08-09 NOTE — Progress Notes (Signed)
Pre visit review using our clinic review tool, if applicable. No additional management support is needed unless otherwise documented below in the visit note. 

## 2014-08-10 LAB — ANA: Anti Nuclear Antibody(ANA): NEGATIVE

## 2014-08-16 ENCOUNTER — Ambulatory Visit (INDEPENDENT_AMBULATORY_CARE_PROVIDER_SITE_OTHER): Payer: Medicare Other | Admitting: Internal Medicine

## 2014-08-16 ENCOUNTER — Encounter: Payer: Self-pay | Admitting: Internal Medicine

## 2014-08-16 VITALS — BP 152/100 | HR 87 | Temp 98.0°F | Wt 187.0 lb

## 2014-08-16 DIAGNOSIS — M10042 Idiopathic gout, left hand: Secondary | ICD-10-CM

## 2014-08-16 DIAGNOSIS — M109 Gout, unspecified: Secondary | ICD-10-CM

## 2014-08-16 MED ORDER — KETOROLAC TROMETHAMINE 60 MG/2ML IM SOLN
60.0000 mg | Freq: Once | INTRAMUSCULAR | Status: AC
  Administered 2014-08-16: 60 mg via INTRAMUSCULAR

## 2014-08-16 MED ORDER — ALLOPURINOL 100 MG PO TABS: 100.0000 mg | ORAL_TABLET | Freq: Every day | ORAL | Status: DC

## 2014-08-16 MED ORDER — PREDNISONE 20 MG PO TABS: ORAL_TABLET | ORAL | Status: DC

## 2014-08-16 NOTE — Addendum Note (Signed)
Addended by: Lurlean Nanny on: 08/16/2014 10:34 AM   Modules accepted: Orders

## 2014-08-16 NOTE — Progress Notes (Signed)
Subjective:    Patient ID: Travis [name], male    DOB: 1955-10-08, 59 y.o.   MRN: [id number]  HPI  Pt presents to the clinic today to follow up left hand pain and swelling. He was seen 08/09/14 for the same. He was given 80 mg Depo IM, put on a Prednisone taper and given Percocet for pain. Uric acid level was WNL. RF, ANA were both normal. ESR was elevated. Xray of the left hand showed:  FINDINGS: Advanced degenerative change base of thumb at the first carpometacarpal joint. Mild degenerative change in the first metacarpophalangeal joint and interphalangeal joint. Mild degenerative change in the second through fourth PIP joints.  He reports he did notice some reduction in the amount of swelling and pain while he was on the Prednisone. But as soon as he finished the RX, his hand started swelling again and the pain returned.  Review of Systems      Past Medical History  Diagnosis Date  . Systolic heart failure     EF 20-25% by echo 12/28/10  . Nonischemic cardiomyopathy     minimal coronary disease by cath 12/29/10  . Hemoptysis     secondary to pulmonary edema  . Hyperlipidemia   . Chronic anemia   . GI bleed     15 years ago  . Tobacco abuse   . Alcohol abuse   . Osteoarthritis   . Gout   . Diabetes mellitus, type 2     pt reports his DM is gone  . Alcoholic cardiomyopathy 14/12/3011    Current Outpatient Prescriptions  Medication Sig Dispense Refill  . aspirin 325 MG EC tablet Take 325 mg by mouth daily.    [name] atorvastatin (LIPITOR) 10 MG tablet Take 1 tablet (10 mg total) by mouth daily at 6 PM. 30 tablet 6  . carvedilol (COREG) 3.125 MG tablet Take 1 tablet (3.125 mg total) by mouth 2 (two) times daily with a meal. 60 tablet 6  . colchicine 0.6 MG tablet Take 1 tablet (0.6 mg total) by mouth 2 (two) times daily. 12 tablet 0  . cyclobenzaprine (FLEXERIL) 10 MG tablet Take 1 tablet (10 mg total) by mouth 3 (three) times daily as needed for muscle spasms. 30 tablet  0  . dicyclomine (BENTYL) 20 MG tablet     . diphenoxylate-atropine (LOMOTIL) 2.5-0.025 MG per tablet Take 1 tablet by mouth 4 (four) times daily as needed for diarrhea or loose stools. 30 tablet 0  . etodolac (LODINE) 500 MG tablet TAKE 1 TABLET BY MOUTH TWICE A DAY 60 tablet 2  . fenofibrate 54 MG tablet Take 1 tablet (54 mg total) by mouth daily. 30 tablet 6  . folic acid (FOLVITE) 1 MG tablet Take 1 tablet (1 mg total) by mouth daily. 30 tablet 6  . ibuprofen (ADVIL,MOTRIN) 800 MG tablet   0  . lisinopril (PRINIVIL,ZESTRIL) 5 MG tablet Take 1 tablet (5 mg total) by mouth daily. 30 tablet 3  . loperamide (IMODIUM) 2 MG capsule Take 2 mg by mouth as needed for diarrhea or loose stools.    . Multiple Vitamin (ONE-A-DAY MENS PO) Take 1 tablet by mouth.    . Omega-3 Fatty Acids (FISH OIL PO) Take 1 capsule by mouth daily.    . ondansetron (ZOFRAN ODT) 4 MG disintegrating tablet Take 1 tablet (4 mg total) by mouth every 8 (eight) hours as needed for nausea or vomiting. 20 tablet 0  . ondansetron (ZOFRAN) 4 MG tablet     .  thiamine 100 MG tablet Take 1 tablet (100 mg total) by mouth daily. 30 tablet 0   No current facility-administered medications for this visit.    No Known Allergies  Family History  Problem Relation Age of Onset  . Diabetes Mother   . Hypertension Mother   . Diabetes Father   . Hypertension Father   . Cancer Neg Hx   . Heart disease Neg Hx   . Stroke Neg Hx     History   Social History  . Marital Status: Single    Spouse Name: N/A  . Number of Children: N/A  . Years of Education: N/A   Occupational History  . Not on file.   Social History Main Topics  . Smoking status: Current Every Day Smoker -- 1.00 packs/day for 30 years    Types: Cigarettes  . Smokeless tobacco: Never Used  . Alcohol Use: 0.6 oz/week    1 Glasses of wine per week     Comment: occasionally  . Drug Use: Yes    Special: Marijuana  . Sexual Activity: Yes   Other Topics Concern  .  Not on file   Social History Narrative     Constitutional: Denies fever, malaise, fatigue, headache or abrupt weight changes.  Respiratory: Denies difficulty breathing, shortness of breath, cough or sputum production.   Cardiovascular: Denies chest pain, chest tightness, palpitations or swelling in the hands or feet.  Musculoskeletal: Pt reports left hand pain and swelling. Denies difficulty with gait, muscle pain.  Skin: Denies redness, rashes, lesions or ulcercations.   No other specific complaints in a complete review of systems (except as listed in HPI above).  Objective:   Physical Exam   BP 152/100 mmHg  Pulse 87  Temp(Src) 98 F (36.7 C) (Oral)  Wt 187 lb (84.823 kg)  SpO2 98% Wt Readings from Last 3 Encounters:  08/16/14 187 lb (84.823 kg)  08/09/14 182 lb (82.555 kg)  08/08/14 186 lb (84.369 kg)    General: Appears his stated age, well developed, well nourished in NAD. Skin: Warm, dry and intact. No rednessnoted of the left hand. Warmth noted of left hand. Cardiovascular: Normal rate and rhythm. S1,S2 noted.  No murmur, rubs or gallops noted. Radial pulse 2+ bilaterally. Pulmonary/Chest: Normal effort and positive vesicular breath sounds. No respiratory distress. No wheezes, rales or ronchi noted.  Musculoskeletal: Decreased flexion, extension and rotation of the left wrist and fingers. 2 + swelling noted of the left hand. Grip strength unequal, R>L. Neurological: Alert and oriented. Sensation intact to BLE.   BMET    Component Value Date/Time   NA 140 05/03/2014 1354   NA 141 04/20/2014 1019   NA 145* 02/25/2012 1409   K 4.1 05/03/2014 1354   K 3.7 04/20/2014 1019   CL 107 05/03/2014 1354   CL 106 04/20/2014 1019   CO2 29 05/03/2014 1354   CO2 30 04/20/2014 1019   GLUCOSE 91 05/03/2014 1354   GLUCOSE 102* 04/20/2014 1019   GLUCOSE 87 02/25/2012 1409   BUN 16 05/03/2014 1354   BUN 14 04/20/2014 1019   BUN 11 02/25/2012 1409   CREATININE 1.29 05/03/2014  1354   CREATININE 1.18 04/20/2014 1019   CALCIUM 9.3 05/03/2014 1354   CALCIUM 9.3 04/20/2014 1019   GFRNONAA 32* 04/26/2014 1159   GFRNONAA >60 11/19/2012 1107   GFRAA 37* 04/26/2014 1159   GFRAA >60 11/19/2012 1107    Lipid Panel     Component Value Date/Time   CHOL  153 12/31/2013 1403   TRIG 221.0* 12/31/2013 1403   HDL 28.70* 12/31/2013 1403   CHOLHDL 5 12/31/2013 1403   VLDL 44.2* 12/31/2013 1403   LDLCALC UNABLE TO CALCULATE IF TRIGLYCERIDE OVER 400 mg/dL 09/14/2013 0510    CBC    Component Value Date/Time   WBC 5.8 04/26/2014 1159   WBC 7.9 04/20/2014 1019   RBC 4.06* 04/26/2014 1159   RBC 4.65 04/20/2014 1019   RBC 2.93* 12/28/2010 0345   HGB 12.2* 04/26/2014 1159   HGB 13.5 04/20/2014 1019   HCT 36.4* 04/26/2014 1159   HCT 41.8 04/20/2014 1019   PLT 333 04/26/2014 1159   PLT 348 04/20/2014 1019   MCV 89.7 04/26/2014 1159   MCV 90 04/20/2014 1019   MCH 30.0 04/26/2014 1159   MCH 29.1 04/20/2014 1019   MCHC 33.5 04/26/2014 1159   MCHC 32.4 04/20/2014 1019   RDW 15.0 04/26/2014 1159   RDW 16.2* 04/20/2014 1019   LYMPHSABS 2.2 04/26/2014 1159   LYMPHSABS 2.1 04/20/2014 1019   MONOABS 0.5 04/26/2014 1159   MONOABS 0.5 04/20/2014 1019   EOSABS 0.8* 04/26/2014 1159   EOSABS 0.3 04/20/2014 1019   BASOSABS 0.0 04/26/2014 1159   BASOSABS 0.1 04/20/2014 1019   BASOSABS 0 06/13/2012 1515    Hgb A1C Lab Results  Component Value Date   HGBA1C 6.6* 12/31/2013        Assessment & Plan:   Gout of left hand, recurrent:  Will repeat pred taper, for a longer course Will restart Allopurinol 100 mg daily, RX given today 60 mg Toradol IM today STAT referral to rheumatology given elevated ESR, and uncontrolled gout  Will follow up as needed 

## 2014-08-16 NOTE — Patient Instructions (Signed)

## 2014-08-16 NOTE — Progress Notes (Signed)
Pre visit review using our clinic review tool, if applicable. No additional management support is needed unless otherwise documented below in the visit note. 

## 2014-08-18 ENCOUNTER — Telehealth: Payer: Self-pay

## 2014-08-18 ENCOUNTER — Telehealth: Payer: Self-pay | Admitting: Internal Medicine

## 2014-08-18 ENCOUNTER — Ambulatory Visit: Payer: Medicare Other | Admitting: Internal Medicine

## 2014-08-18 MED ORDER — INDOMETHACIN 50 MG PO CAPS
50.0000 mg | ORAL_CAPSULE | Freq: Three times a day (TID) | ORAL | Status: DC
Start: 2014-08-18 — End: 2014-11-26

## 2014-08-18 MED ORDER — COLCHICINE 0.6 MG PO TABS: 0.6000 mg | ORAL_TABLET | Freq: Two times a day (BID) | ORAL | Status: DC

## 2014-08-18 NOTE — Telephone Encounter (Signed)
Pt left v/m requesting cb with what OTC med pt can get to help gout pain; pt seen 08/16/14.Please advise.

## 2014-08-18 NOTE — Telephone Encounter (Signed)
Pt is aware as instructed below to stop allopurinol and prednisone and start colchicine and Indomethacin---pt has appt with Dr Lorelei Pont tomorrow

## 2014-08-18 NOTE — Telephone Encounter (Signed)
Mel already has a note to call pt about this

## 2014-08-18 NOTE — Telephone Encounter (Signed)
Call pt:  Spoke with Dr. Lorelei Pont. Stop allopurinol and prednisone. Start colchicine BID and Indomethacin TUD, meds have been called into pharmacy, see if he can be put on Dr. Lorelei Pont schedule Thursday or Monday

## 2014-08-18 NOTE — Telephone Encounter (Signed)
FYI: Pt is schedule for today at 1:15pm for med refill and gout in hand. We are still working on urgent referral to Rheumatology. Pt does not want [facility] due to outstanding balance. Dr. Estanislado Pandy does not offer urgent referrals and next appt is July 8th. Pt does not want to wait that long. I am trying to get appt with Dr. Charlestine Night.

## 2014-08-18 NOTE — Telephone Encounter (Signed)
Dr. Charlestine Night is closed from 08/16/14-08/23/14. Faxed urgent referral to [facility]. They will not schedule with [place] over phone. They have to review records and will call pt directly to schedule.

## 2014-08-18 NOTE — Telephone Encounter (Signed)
This has already been taken care of--please refer to previous phone note

## 2014-08-19 ENCOUNTER — Encounter: Payer: Self-pay | Admitting: Family Medicine

## 2014-08-19 ENCOUNTER — Ambulatory Visit (INDEPENDENT_AMBULATORY_CARE_PROVIDER_SITE_OTHER): Payer: Medicare Other | Admitting: Family Medicine

## 2014-08-19 VITALS — BP 150/90 | HR 87 | Temp 97.7°F | Ht 76.0 in | Wt 184.5 lb

## 2014-08-19 DIAGNOSIS — M1000 Idiopathic gout, unspecified site: Secondary | ICD-10-CM

## 2014-08-19 DIAGNOSIS — Z550 Illiteracy and low-level literacy: Secondary | ICD-10-CM

## 2014-08-19 DIAGNOSIS — M25532 Pain in left wrist: Secondary | ICD-10-CM

## 2014-08-19 MED ORDER — METHYLPREDNISOLONE ACETATE 40 MG/ML IJ SUSP
40.0000 mg | Freq: Once | INTRAMUSCULAR | Status: AC
  Administered 2014-08-19: 40 mg via INTRA_ARTICULAR

## 2014-08-19 NOTE — Patient Instructions (Addendum)
For right now, stop the Allopurinol (Stop the new white pill)  For now, stop the Etodolac (the purple pill)   BE SURE TO TAKE NOW until the hand and wrist pain goes away: Colchicine 0.6 mg, 1 tablet a day for twice a day  Indomethacin 50 mg, 1 tablet three times a day

## 2014-08-19 NOTE — Progress Notes (Signed)
Dr. Frederico Hamman T. Cynda Soule, MD, [facility] [place], [address] Phone: [phone] Fax: [phone]  08/19/2014  Patient: Travis [name], MRN: [id number], DOB: 07-22-1955, 59 y.o.  Primary Physician:  [name], NP  Chief Complaint: Swollen Hand  Subjective:   Travis [name] is a 59 y.o. very pleasant male patient who presents with the following:  L wrist pain. I was asked to see him urgently. Very nice gentleman who I recall well.  He is actually been to the emergency room twice and also been to our office in the last 2 weeks.  For persistent left-sided wrist pain, swelling, and pain in the left wrist and in the joints of his left hand as well.  He has been on relatively high doses of oral prednisone, which is helped his symptoms, but only transiently.  He also has tried taking some Lodine intermittently.  He has not tried taking any colchicine, and he has not tried taking any indomethacin until last night. On Monday, he was started on allopurinol acutely. ESR is 80. We stopped the allopurinol last night and had him begin Indocin 50 mg TID and colchicine BID.   Elevated uric acid in the past.   Past Medical History, Surgical History, Social History, Family History, Problem List, Medications, and Allergies have been reviewed and updated if relevant.  GEN: No fevers, chills. Nontoxic. Primarily MSK c/o today. MSK: Detailed in the HPI GI: tolerating PO intake without difficulty Neuro: No numbness, parasthesias, or tingling associated. Otherwise the pertinent positives of the ROS are noted above.   Objective:   BP 150/90 mmHg  Pulse 87  Temp(Src) 97.7 F (36.5 C) (Tympanic)  Ht '6\' 4"'  (1.93 m)  Wt 184 lb 8 oz (83.689 kg)  BMI 22.47 kg/m2   GEN: WDWN, NAD, Non-toxic, Alert & Oriented x 3 HEENT: Atraumatic, Normocephalic.  Ears and Nose: No external deformity. EXTR: No clubbing/cyanosis/edema NEURO:  Normal gait.  PSYCH: Normally interactive. Conversant. Not depressed or anxious appearing.  Calm demeanor.    The left wrist and distally in the hand there is some redness.  There is some diffuse synovitis and virtually all MCPs, PIP joints.  There is also some synovitis and swelling in the true wrist joint itself.  Full range of motion is normal and appreciable in the elbow.  Radiology: Dg Hand Complete Left  08/09/2014   CLINICAL DATA:  Left hand pain and swelling  EXAM: LEFT HAND - COMPLETE 3+ VIEW  COMPARISON:  None.  FINDINGS: Advanced degenerative change base of thumb at the first carpometacarpal joint. Mild degenerative change in the first metacarpophalangeal joint and interphalangeal joint.  Mild degenerative change in the second through fourth PIP joints.  Negative for fracture.  IMPRESSION: Negative for fracture.   Electronically Signed   By: Franchot Gallo M.D.   On: 08/09/2014 13:20   Recent Results (from the past 2160 hour(s))  ANA     Status: None   Collection Time: 08/09/14 12:14 PM  Result Value Ref Range   Anit Nuclear Antibody(ANA) NEG NEGATIVE  Rheumatoid factor     Status: None   Collection Time: 08/09/14 12:14 PM  Result Value Ref Range   Rhuematoid fact SerPl-aCnc <10 <=14 IU/mL    Comment:                            Interpretive Table  Low Positive: 15 - 41 IU/mL                     High Positive:  >= 42 IU/mL    In addition to the RF result, and clinical symptoms including joint  involvement, the 2010 ACR Classification Criteria for  scoring/diagnosing Rheumatoid Arthritis include the results of the  following tests:  CRP (76546), ESR (15010), and CCP (APCA) (50354).  www.rheumatology.org/practice/clinical/classification/ra/ra_2010.asp   Sedimentation rate     Status: Abnormal   Collection Time: 08/09/14 12:14 PM  Result Value Ref Range   Sed Rate 82 (H) 0 - 22 mm/hr  Uric acid     Status: None   Collection Time: 08/09/14 12:14 PM  Result  Value Ref Range   Uric Acid, Serum 5.4 4.0 - 7.8 mg/dL    Assessment and Plan:   Acute idiopathic gout, unspecified site  Left wrist pain - Plan: methylPREDNISolone acetate (DEPO-MEDROL) injection 40 mg  Literacy level of illiterate  Severe acute gout flare.  Recommend indomethacin 50 mg 3 times a day and colchicine 0.6 mg by mouth twice a day until the patient is feeling better.  Given treatment failure, multiple medications, we will do an intra-articular wrist injection as well.  Once the patient is completely asymptomatic, I would recommend initiating treatment with either allopurinol or Uloric for maintenance prevention of his gout.  This is classic gout. Mrs. Travis [name] has consulted Rheumatology. If the patient does not keep this appointment, then it would be reasonable to titrate up his allopurinol in the classical manner in a primary care setting.  Injection, Intraarticular Wrist, LEFT Patient verbally consented for procedure; risks (including infection), benefits, and alternatives explained. Patient prepped with Chloraprep. Ethyl chloride used for anesthesia. Using sterile technique, just distal to Lister's tubercle, using 3 cc syringe with 22 gauge needle, needle inserted and aspirated, no blood. Then 1/2 cc Lidocaine 1% and Depo-medrol 20 mg injected without difficulty into wrist.  Pressue applied, minimal blood. Tolerated well, decreased pain, no complications.   Follow-up: depending on response.   Signed,  Maud Deed. Koltyn Kelsay, MD  Patient Instructions  For right now, stop the Allopurinol (Stop the new white pill)  For now, stop the Etodolac (the purple pill)   BE SURE TO TAKE NOW until the hand and wrist pain goes away: Colchicine 0.6 mg, 1 tablet a day for twice a day  Indomethacin 50 mg, 1 tablet three times a day    Patient's Medications  New Prescriptions   No medications on file  Previous Medications   ASPIRIN 325 MG EC TABLET    Take 325 mg by mouth daily.    ATORVASTATIN (LIPITOR) 10 MG TABLET    Take 1 tablet (10 mg total) by mouth daily at 6 PM.   CARVEDILOL (COREG) 3.125 MG TABLET    Take 1 tablet (3.125 mg total) by mouth 2 (two) times daily with a meal.   COLCHICINE 0.6 MG TABLET    Take 1 tablet (0.6 mg total) by mouth 2 (two) times daily.   CYCLOBENZAPRINE (FLEXERIL) 10 MG TABLET    Take 1 tablet (10 mg total) by mouth 3 (three) times daily as needed for muscle spasms.   DICYCLOMINE (BENTYL) 20 MG TABLET       DIPHENOXYLATE-ATROPINE (LOMOTIL) 2.5-0.025 MG PER TABLET    Take 1 tablet by mouth 4 (four) times daily as needed for diarrhea or loose stools.   ETODOLAC (LODINE) 500 MG TABLET    TAKE  1 TABLET BY MOUTH TWICE A DAY   FENOFIBRATE 54 MG TABLET    Take 1 tablet (54 mg total) by mouth daily.   FOLIC ACID (FOLVITE) 1 MG TABLET    Take 1 tablet (1 mg total) by mouth daily.   IBUPROFEN (ADVIL,MOTRIN) 800 MG TABLET       INDOMETHACIN (INDOCIN) 50 MG CAPSULE    Take 1 capsule (50 mg total) by mouth 3 (three) times daily with meals.   LISINOPRIL (PRINIVIL,ZESTRIL) 5 MG TABLET    Take 1 tablet (5 mg total) by mouth daily.   LOPERAMIDE (IMODIUM) 2 MG CAPSULE    Take 2 mg by mouth as needed for diarrhea or loose stools.   MULTIPLE VITAMIN (ONE-A-DAY MENS PO)    Take 1 tablet by mouth.   OMEGA-3 FATTY ACIDS (FISH OIL PO)    Take 1 capsule by mouth daily.   ONDANSETRON (ZOFRAN ODT) 4 MG DISINTEGRATING TABLET    Take 1 tablet (4 mg total) by mouth every 8 (eight) hours as needed for nausea or vomiting.   ONDANSETRON (ZOFRAN) 4 MG TABLET       OXYCODONE-ACETAMINOPHEN (PERCOCET/ROXICET) 5-325 MG PER TABLET       PREDNISONE (DELTASONE) 20 MG TABLET    Take 60 mg on days 1-5,  Take 40 mg on days 6-10, take 20 mg on days 11-15.   THIAMINE 100 MG TABLET    Take 1 tablet (100 mg total) by mouth daily.  Modified Medications   No medications on file  Discontinued Medications   ALLOPURINOL (ZYLOPRIM) 100 MG TABLET    Take 1 tablet (100 mg total) by mouth  daily.

## 2014-08-19 NOTE — Progress Notes (Signed)
Pre visit review using our clinic review tool, if applicable. No additional management support is needed unless otherwise documented below in the visit note. 

## 2014-10-18 ENCOUNTER — Ambulatory Visit (INDEPENDENT_AMBULATORY_CARE_PROVIDER_SITE_OTHER): Payer: Medicare Other | Admitting: Nurse Practitioner

## 2014-10-18 VITALS — BP 120/82 | HR 91 | Temp 98.7°F | Resp 18 | Ht 76.0 in | Wt 179.6 lb

## 2014-10-18 DIAGNOSIS — J0110 Acute frontal sinusitis, unspecified: Secondary | ICD-10-CM

## 2014-10-18 NOTE — Progress Notes (Signed)
   Subjective:    Patient ID: Travis [name], male    DOB: [birth year], 59 y.o.   MRN: [id number]  HPI  Travis [name] is a 59 yo male with a CC sinusitis x 4 days.   1) Right eye- reports of pruritus, swelling, headache behind the eye. Denies sick contacts.   Claritin- 1 tablet this morning  Eye drops- Clear eyes   Review of Systems  Constitutional: Negative for fever, chills, diaphoresis and fatigue.  HENT: Positive for facial swelling and sneezing. Negative for ear discharge and ear pain.   Eyes: Positive for pain, discharge and itching. Negative for photophobia, redness and visual disturbance.       Watery   Respiratory: Negative for cough, chest tightness, shortness of breath and wheezing.   Cardiovascular: Negative for chest pain, palpitations and leg swelling.  Neurological: Positive for headaches. Negative for dizziness, weakness and numbness.  Psychiatric/Behavioral: The patient is not nervous/anxious.       Objective:   Physical Exam  Constitutional: He is oriented to person, place, and time. He appears well-developed and well-nourished. No distress.  BP 120/82 mmHg  Pulse 91  Temp(Src) 98.7 F (37.1 C)  Resp 18  Ht 6\' 4"  (1.93 m)  Wt 179 lb 9.6 oz (81.466 kg)  BMI 21.87 kg/m2  SpO2 97%   HENT:  Head: Normocephalic and atraumatic.  Right Ear: External ear normal.  Left Ear: External ear normal.  Mouth/Throat: No oropharyngeal exudate.  Eyes: Conjunctivae and EOM are normal. Pupils are equal, round, and reactive to light. Right eye exhibits no discharge. Left eye exhibits no discharge. No scleral icterus.  Slight scleral icterus or hyperpigmentation; hx of alcoholism  Neck: Normal range of motion. Neck supple. No thyromegaly present.  Cardiovascular: Normal rate, regular rhythm, normal heart sounds and intact distal pulses.  Exam reveals no gallop and no friction rub.   No murmur heard. Pulmonary/Chest: Effort normal and breath sounds normal. No respiratory  distress. He has no wheezes. He has no rales. He exhibits no tenderness.  Lymphadenopathy:    He has no cervical adenopathy.  Neurological: He is alert and oriented to person, place, and time.  Skin: Skin is warm and dry. No rash noted. He is not diaphoretic.  Psychiatric: He has a normal mood and affect. His behavior is normal. Judgment and thought content normal.      Assessment & Plan:

## 2014-10-18 NOTE — Patient Instructions (Addendum)
Try benadryl OTC to help with symptoms. Use cold wet cloth on eye to help decrease puffiness.

## 2014-10-18 NOTE — Progress Notes (Signed)
Pre visit review using our clinic review tool, if applicable. No additional management support is needed unless otherwise documented below in the visit note. 

## 2014-10-27 ENCOUNTER — Other Ambulatory Visit: Payer: Self-pay | Admitting: Internal Medicine

## 2014-10-27 ENCOUNTER — Encounter: Payer: Self-pay | Admitting: Nurse Practitioner

## 2014-10-27 DIAGNOSIS — J329 Chronic sinusitis, unspecified: Secondary | ICD-10-CM | POA: Insufficient documentation

## 2014-10-27 MED ORDER — ETODOLAC 500 MG PO TABS: 500.0000 mg | ORAL_TABLET | Freq: Two times a day (BID) | ORAL | Status: DC

## 2014-10-27 NOTE — Assessment & Plan Note (Addendum)
Pt is a very nice man concerned about his right eye. Worsening x 4 days. No findings on exam. Will ask pt to try benadryl and a cool wet cloth to the eye several times a day to reduce puffiness. Asked him to call us back if not helpful.

## 2014-10-27 NOTE — Telephone Encounter (Signed)
Give 30 day supply, he is due for followup

## 2014-11-26 ENCOUNTER — Other Ambulatory Visit: Payer: Self-pay | Admitting: Internal Medicine

## 2014-12-28 ENCOUNTER — Other Ambulatory Visit (HOSPITAL_COMMUNITY): Payer: Self-pay | Admitting: *Deleted

## 2014-12-28 MED ORDER — FOLIC ACID 1 MG PO TABS: 1.0000 mg | ORAL_TABLET | Freq: Every day | ORAL | Status: DC

## 2014-12-28 MED ORDER — FENOFIBRATE 54 MG PO TABS: 54.0000 mg | ORAL_TABLET | Freq: Every day | ORAL | Status: DC

## 2014-12-29 ENCOUNTER — Other Ambulatory Visit (HOSPITAL_COMMUNITY): Payer: Self-pay | Admitting: *Deleted

## 2014-12-29 MED ORDER — CARVEDILOL 3.125 MG PO TABS: 3.1250 mg | ORAL_TABLET | Freq: Two times a day (BID) | ORAL | Status: DC

## 2015-01-24 ENCOUNTER — Encounter: Payer: Self-pay | Admitting: Emergency Medicine

## 2015-01-24 ENCOUNTER — Telehealth: Payer: Self-pay | Admitting: Cardiovascular Disease

## 2015-01-24 ENCOUNTER — Emergency Department
Admission: EM | Admit: 2015-01-24 | Discharge: 2015-01-24 | Disposition: A | Discharge status: Home / Self Care | Payer: Medicare Other | Source: Home / Self Care | Attending: Emergency Medicine | Admitting: Emergency Medicine

## 2015-01-24 ENCOUNTER — Emergency Department: Payer: Medicare Other

## 2015-01-24 DIAGNOSIS — I110 Hypertensive heart disease with heart failure: Secondary | ICD-10-CM | POA: Diagnosis not present

## 2015-01-24 DIAGNOSIS — I509 Heart failure, unspecified: Secondary | ICD-10-CM | POA: Diagnosis not present

## 2015-01-24 DIAGNOSIS — F17210 Nicotine dependence, cigarettes, uncomplicated: Secondary | ICD-10-CM | POA: Diagnosis not present

## 2015-01-24 DIAGNOSIS — R0602 Shortness of breath: Secondary | ICD-10-CM | POA: Diagnosis not present

## 2015-01-24 DIAGNOSIS — R0789 Other chest pain: Secondary | ICD-10-CM | POA: Diagnosis not present

## 2015-01-24 DIAGNOSIS — R079 Chest pain, unspecified: Secondary | ICD-10-CM

## 2015-01-24 LAB — BASIC METABOLIC PANEL
Anion gap: 6 (ref 5–15)
BUN: 16 mg/dL (ref 6–20)
CO2: 26 mmol/L (ref 22–32)
CREATININE: 1.31 mg/dL — AB (ref 0.61–1.24)
Calcium: 9 mg/dL (ref 8.9–10.3)
Chloride: 110 mmol/L (ref 101–111)
GFR calc Af Amer: >60 mL/min (ref >60)
GFR, EST NON AFRICAN AMERICAN: 58 mL/min — AB (ref >60)
Glucose, Bld: 123 mg/dL — ABNORMAL HIGH (ref 65–99)
Potassium: 3.7 mmol/L (ref 3.5–5.1)
SODIUM: 142 mmol/L (ref 135–145)

## 2015-01-24 LAB — TROPONIN I

## 2015-01-24 LAB — CBC
HCT: 40.6 % (ref 40.0–52.0)
Hemoglobin: 13.4 g/dL (ref 13.0–18.0)
MCH: 31.4 pg (ref 26.0–34.0)
MCHC: 33 g/dL (ref 32.0–36.0)
MCV: 95.2 fL (ref 80.0–100.0)
Platelets: 204 10*3/uL (ref 150–440)
RBC: 4.27 MIL/uL — ABNORMAL LOW (ref 4.40–5.90)
RDW: 15.2 % — AB (ref 11.5–14.5)
WBC: 7.2 10*3/uL (ref 3.8–10.6)

## 2015-01-24 MED ORDER — OXYCODONE-ACETAMINOPHEN 5-325 MG PO TABS
1.0000 | ORAL_TABLET | Freq: Once | ORAL | Status: AC
  Administered 2015-01-24: 1 via ORAL
  Filled 2015-01-24: qty 1

## 2015-01-24 MED ORDER — IPRATROPIUM-ALBUTEROL 0.5-2.5 (3) MG/3ML IN SOLN
3.0000 mL | Freq: Once | RESPIRATORY_TRACT | Status: AC
  Administered 2015-01-24: 3 mL via RESPIRATORY_TRACT
  Filled 2015-01-24: qty 3

## 2015-01-24 MED ORDER — ALBUTEROL SULFATE HFA 108 (90 BASE) MCG/ACT IN AERS: 2.0000 | INHALATION_SPRAY | Freq: Four times a day (QID) | RESPIRATORY_TRACT | Status: DC | PRN

## 2015-01-24 NOTE — Discharge Instructions (Signed)
Your cardiac enzyme looks normal. Her EKG is unchanged from before. We discussed the case with Dr. Rockey Situ, cardiology. He will be glad to see her in his office in his office will likely call you to arrange a follow-up appointment. Return the emergency department if you have worsening pain or shortness of breath or if you have other urgent concerns.  Nonspecific Chest Pain  Chest pain can be caused by many different conditions. There is always a chance that your pain could be related to something serious, such as a heart attack or a blood clot in your lungs. Chest pain can also be caused by conditions that are not life-threatening. If you have chest pain, it is very important to follow up with your health care provider. CAUSES  Chest pain can be caused by:  Heartburn.  Pneumonia or bronchitis.  Anxiety or stress.  Inflammation around your heart (pericarditis) or lung (pleuritis or pleurisy).  A blood clot in your lung.  A collapsed lung (pneumothorax). It can develop suddenly on its own (spontaneous pneumothorax) or from trauma to the chest.  Shingles infection (varicella-zoster virus).  Heart attack.  Damage to the bones, muscles, and cartilage that make up your chest wall. This can include:  Bruised bones due to injury.  Strained muscles or cartilage due to frequent or repeated coughing or overwork.  Fracture to one or more ribs.  Sore cartilage due to inflammation (costochondritis). RISK FACTORS  Risk factors for chest pain may include:  Activities that increase your risk for trauma or injury to your chest.  Respiratory infections or conditions that cause frequent coughing.  Medical conditions or overeating that can cause heartburn.  Heart disease or family history of heart disease.  Conditions or health behaviors that increase your risk of developing a blood clot.  Having had chicken pox (varicella zoster). SIGNS AND SYMPTOMS Chest pain can feel like:  Burning or  tingling on the surface of your chest or deep in your chest.  Crushing, pressure, aching, or squeezing pain.  Dull or sharp pain that is worse when you move, cough, or take a deep breath.  Pain that is also felt in your back, neck, shoulder, or arm, or pain that spreads to any of these areas. Your chest pain may come and go, or it may stay constant. DIAGNOSIS Lab tests or other studies may be needed to find the cause of your pain. Your health care provider may have you take a test called an ambulatory ECG (electrocardiogram). An ECG records your heartbeat patterns at the time the test is performed. You may also have other tests, such as:  Transthoracic echocardiogram (TTE). During echocardiography, sound waves are used to create a picture of all of the heart structures and to look at how blood flows through your heart.  Transesophageal echocardiogram (TEE).This is a more advanced imaging test that obtains images from inside your body. It allows your health care provider to see your heart in finer detail.  Cardiac monitoring. This allows your health care provider to monitor your heart rate and rhythm in real time.  Holter monitor. This is a portable device that records your heartbeat and can help to diagnose abnormal heartbeats. It allows your health care provider to track your heart activity for several days, if needed.  Stress tests. These can be done through exercise or by taking medicine that makes your heart beat more quickly.  Blood tests.  Imaging tests. TREATMENT  Your treatment depends on what is causing your chest  pain. Treatment may include:  Medicines. These may include:  Acid blockers for heartburn.  Anti-inflammatory medicine.  Pain medicine for inflammatory conditions.  Antibiotic medicine, if an infection is present.  Medicines to dissolve blood clots.  Medicines to treat coronary artery disease.  Supportive care for conditions that do not require medicines.  This may include:  Resting.  Applying heat or cold packs to injured areas.  Limiting activities until pain decreases. HOME CARE INSTRUCTIONS  If you were prescribed an antibiotic medicine, finish it all even if you start to feel better.  Avoid any activities that bring on chest pain.  Do not use any tobacco products, including cigarettes, chewing tobacco, or electronic cigarettes. If you need help quitting, ask your health care provider.  Do not drink alcohol.  Take medicines only as directed by your health care provider.  Keep all follow-up visits as directed by your health care provider. This is important. This includes any further testing if your chest pain does not go away.  If heartburn is the cause for your chest pain, you may be told to keep your head raised (elevated) while sleeping. This reduces the chance that acid will go from your stomach into your esophagus.  Make lifestyle changes as directed by your health care provider. These may include:  Getting regular exercise. Ask your health care provider to suggest some activities that are safe for you.  Eating a heart-healthy diet. A registered dietitian can help you to learn healthy eating options.  Maintaining a healthy weight.  Managing diabetes, if necessary.  Reducing stress. SEEK MEDICAL CARE IF:  Your chest pain does not go away after treatment.  You have a rash with blisters on your chest.  You have a fever. SEEK IMMEDIATE MEDICAL CARE IF:   Your chest pain is worse.  You have an increasing cough, or you cough up blood.  You have severe abdominal pain.  You have severe weakness.  You faint.  You have chills.  You have sudden, unexplained chest discomfort.  You have sudden, unexplained discomfort in your arms, back, neck, or jaw.  You have shortness of breath at any time.  You suddenly start to sweat, or your skin gets clammy.  You feel nauseous or you vomit.  You suddenly feel  light-headed or dizzy.  Your heart begins to beat quickly, or it feels like it is skipping beats. These symptoms may represent a serious problem that is an emergency. Do not wait to see if the symptoms will go away. Get medical help right away. Call your local emergency services (911 in the [place] Do not drive yourself to the hospital.   This information is not intended to replace advice given to you by your health care provider. Make sure you discuss any questions you have with your health care provider.   Document Released: 12/20/2004 Document Revised: 04/02/2014 Document Reviewed: 10/16/2013 Elsevier Interactive Patient Education [employer].

## 2015-01-24 NOTE — Telephone Encounter (Signed)
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

## 2015-01-24 NOTE — ED Provider Notes (Addendum)
[facility] Emergency Department Provider Note  ____________________________________________  Time seen: 1020  I have reviewed the triage vital signs and the nursing notes.   HISTORY  Chief Complaint Shortness of Breath and Rash     HPI Travis [name] is a 59 y.o. male presents to the emergency department complaining of some chest discomfort and shortness of breath which began last night at approximately 12:30 AM. The patient does have a history of hypertension. He denies a history of cardiac problems, but he says he had a "light stroke, once (records show history of heart to your with low EF and cardiomyopathy). He has seen a cardiologist in [place] at [facility].  Patient also reports she has a flare of his "arthritis". He has a history of similar. He is having discomfort in the left leg and right knee. This is similar to before. He denies any swelling or edema.     Past Medical History  Diagnosis Date  . Systolic heart failure     EF 20-25% by echo 12/28/10  . Nonischemic cardiomyopathy ([place])     minimal coronary disease by cath 12/29/10  . Hemoptysis     secondary to pulmonary edema  . Hyperlipidemia   . Chronic anemia   . GI bleed     15 years ago  . Tobacco abuse   . Alcohol abuse   . Osteoarthritis   . Gout   . Diabetes mellitus, type 2 ([place])     pt reports his DM is gone  . 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 Sinusitis 10/27/2014  . Literacy level of illiterate 06/04/2014  . Noncompliance 10/22/2013  . 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 Cardiomyopathy, nonischemic ([place]) 03/15/2013  . Smoker 03/14/2013  . Alcoholism in remission ([place]) 03/14/2013  . Chronic systolic heart failure ([place]) 01/05/2011  . DM2 (diabetes mellitus, type 2) ([place]) 11/30/2008  . HLD (hyperlipidemia) 11/30/2008  . Gout 11/30/2008  . Iron deficiency anemia,  unspecified 11/30/2008    Past Surgical History  Procedure Laterality Date  . Cardiac catheterization    . Hip arthroplasty Right 03/15/2013    Procedure: ARTHROPLASTY BIPOLAR HIP;  Surgeon: [name], MD;  Location: [place];  Service: Orthopedics;  Laterality: Right;    Current Outpatient Rx  Name  Route  Sig  Dispense  Refill  . aspirin 325 MG EC tablet   Oral   Take 325 mg by mouth daily.         [name] atorvastatin (LIPITOR) 10 MG tablet   Oral   Take 1 tablet (10 mg total) by mouth daily at 6 PM. Patient taking differently: Take 10 mg by mouth at bedtime.    30 tablet   6   . carvedilol (COREG) 3.125 MG tablet   Oral   Take 1 tablet (3.125 mg total) by mouth 2 (two) times daily with a meal.   60 tablet   6   . colchicine 0.6 MG tablet      TAKE 1 TABLET BY MOUTH TWICE A DAY   60 tablet   1   . cyclobenzaprine (FLEXERIL) 10 MG tablet   Oral   Take 1 tablet (10 mg total) by mouth 3 (three) times daily as needed for muscle spasms.   30 tablet   0   . diphenoxylate-atropine (LOMOTIL) 2.5-0.025 MG per tablet   Oral   Take 1 tablet  by mouth 4 (four) times daily as needed for diarrhea or loose stools.   30 tablet   0   . etodolac (LODINE) 500 MG tablet   Oral   Take 1 tablet (500 mg total) by mouth 2 (two) times daily. SCHEDULE FOLLOW UP OFFICE VISIT FOR MORE REFILLS [phone]   60 tablet   0   . fenofibrate 54 MG tablet   Oral   Take 1 tablet (54 mg total) by mouth daily.   30 tablet   6   . folic acid (FOLVITE) 1 MG tablet   Oral   Take 1 tablet (1 mg total) by mouth daily.   30 tablet   6   . ibuprofen (ADVIL,MOTRIN) 800 MG tablet   Oral   Take 800 mg by mouth every 8 (eight) hours as needed for fever, headache, mild pain, moderate pain or cramping.       0   . indomethacin (INDOCIN) 50 MG capsule      TAKE 1 CAPSULE BY MOUTH 3 TIMES A DAY WITH FOOD Patient taking differently: TAKE 1 CAPSULE BY MOUTH DAILY   90 capsule   1   .  lisinopril (PRINIVIL,ZESTRIL) 5 MG tablet   Oral   Take 1 tablet (5 mg total) by mouth daily. SCHEDULE FOLLOW UP OFFICE VISIT FOR MORE REFILLS [phone]   30 tablet   0   . Multiple Vitamin (ONE-A-DAY MENS PO)   Oral   Take 1 tablet by mouth daily.          . Omega-3 Fatty Acids (FISH OIL PO)   Oral   Take 1 capsule by mouth daily.         . ondansetron (ZOFRAN ODT) 4 MG disintegrating tablet   Oral   Take 1 tablet (4 mg total) by mouth every 8 (eight) hours as needed for nausea or vomiting.   20 tablet   0   . thiamine 100 MG tablet   Oral   Take 1 tablet (100 mg total) by mouth daily.   30 tablet   0   . albuterol (PROVENTIL HFA;VENTOLIN HFA) 108 (90 BASE) MCG/ACT inhaler   Inhalation   Inhale 2 puffs into the lungs every 6 (six) hours as needed for wheezing or shortness of breath. Take 2 puffs every 5-10 minutes up to 6 puffs total over 15 minutes when needed.  Use with a spacer.   1 Inhaler   2   . predniSONE (DELTASONE) 20 MG tablet      Take 60 mg on days 1-5,  Take 40 mg on days 6-10, take 20 mg on days 11-15. Patient not taking: Reported on 01/24/2015   30 tablet   0     Allergies Review of patient's allergies indicates no known allergies.  Family History  Problem Relation Age of Onset  . Diabetes Mother   . Hypertension Mother   . Diabetes Father   . Hypertension Father   . Cancer Neg Hx   . Heart disease Neg Hx   . Stroke Neg Hx     Social History Social History  Substance Use Topics  . Smoking status: Current Every Day Smoker -- 1.00 packs/day for 30 years    Types: Cigarettes  . Smokeless tobacco: Never Used  . Alcohol Use: 0.6 oz/week    1 Glasses of wine per week     Comment: occasionally    Review of Systems  Constitutional: Negative for fever. ENT: Negative for sore throat.  Cardiovascular: Positive for chest pain. Respiratory: Mild shortness of breath and cough.  Gastrointestinal: Negative for abdominal pain, vomiting and  diarrhea. Genitourinary: Negative for dysuria. Musculoskeletal: No myalgias or injuries. Skin: Negative for rash. Neurological: Negative for paresthesia or weakness   10-point ROS otherwise negative.  ____________________________________________   PHYSICAL EXAM:  VITAL SIGNS: ED Triage Vitals  Enc Vitals Group     BP 01/24/15 0934 129/87 mmHg     Pulse Rate 01/24/15 0934 105     Resp 01/24/15 0934 18     Temp 01/24/15 0934 98.3 F (36.8 C)     Temp Source 01/24/15 0934 Oral     SpO2 01/24/15 0934 98 %     Weight 01/24/15 0929 180 lb (81.647 kg)     Height 01/24/15 0929 6\' 4"  (1.93 m)     Head Cir --      Peak Flow --      Pain Score 01/24/15 0929 7     Pain Loc --      Pain Edu? --      Excl. in [place]? --     Constitutional: Alert and oriented. Appears slightly uncomfortable but otherwise in no acute distress. ENT   Head: Normocephalic and atraumatic.   Nose: No congestion/rhinnorhea.       Mouth: Moist mucous membranes, no erythema.   Cardiovascular: Normal rate, regular rhythm, no murmur noted Respiratory:  Normal respiratory effort, no tachypnea.    Breath sounds are clear and equal bilaterally.  Gastrointestinal: Soft and nontender. No distention.  Back: No muscle spasm, no tenderness, no CVA tenderness. Musculoskeletal: No deformity noted. Nontender with normal range of motion in all extremities.  No noted edema. Neurologic:  Normal speech and language. No gross focal neurologic deficits are appreciated.  Skin:  Skin is warm, dry. No rash noted. Psychiatric: Mood and affect are normal. Speech and behavior are normal.  ____________________________________________    LABS (pertinent positives/negatives)  Labs Reviewed  BASIC METABOLIC PANEL - Abnormal; Notable for the following:    Glucose, Bld 123 (*)    Creatinine, Ser 1.31 (*)    GFR calc non Af Amer 58 (*)    All other components within normal limits  CBC - Abnormal; Notable for the following:     RBC 4.27 (*)    RDW 15.2 (*)    All other components within normal limits  TROPONIN I     ____________________________________________   EKG  ED ECG REPORT I, Nychelle Cassata W, the attending physician, personally viewed and interpreted this ECG.   Date: 01/24/2015  EKG Time: 9:34 AM  Rate: 101  Rhythm: Sinus tachycardia  Axis: Left axis deviation  Intervals: QTC of 510  ST&T Change: Downward T-wave in lead V4-V6,  with flat T-wave in lead 3 and aVF.   ____________________________________________    RADIOLOGY  Chest x-ray:  FINDINGS: The lungs are hyperinflated likely secondary to COPD. There is no focal parenchymal opacity. There is no pleural effusion or pneumothorax. The heart and mediastinal contours are unremarkable.  The osseous structures are unremarkable.  IMPRESSION: No active cardiopulmonary disease  ____________________________________________  INITIAL IMPRESSION / ASSESSMENT AND PLAN / ED COURSE  Pertinent labs & imaging results that were available during my care of the patient were reviewed by me and considered in my medical decision making (see chart for details).  Pleasant 59 year old male who looks a little bit uncomfortable with an EKG that is similar to prior EKGs.  ----------------------------------------- 11:40 AM on 01/24/2015 -----------------------------------------  I discussed the case with Dr. Esmond Plants. He was able to pull the patient's prior records and EKG. He sees the patient has nonischemic disease and his EKG is unchanged. He reports that the patient is likely stable for outpatient follow-up and he will be glad to see him in his office. Patient agrees with this plan but feels a little bit short of breath currently. He would like a breathing treatment and I think that would be helpful. He also complains of ongoing pain. We'll give him one Percocet now.  ----------------------------------------- 12:46 PM on  01/24/2015 -----------------------------------------  Patient reports he feels better after the nebulizer treatment. Had like a prescription for albuterol as his MDI is a little bit old. ____________________________________________   FINAL CLINICAL IMPRESSION(S) / ED DIAGNOSES  Final diagnoses:  Chest pain, unspecified chest pain type  Shortness of breath      Ahmed Prima, MD 01/24/15 1143  Ahmed Prima, MD 01/24/15 1246

## 2015-01-24 NOTE — ED Notes (Signed)
Pt to ed with c/o sob since about 1 am last night, diaphoresis and rash to abd area.  Pt able to speak in full complete sentences at triage and appears in no acute respiratory distress.  Pt states last night felt like his heart was beating fast when he felt sob.

## 2015-01-26 ENCOUNTER — Inpatient Hospital Stay
Admission: EM | Admit: 2015-01-26 | Discharge: 2015-01-28 | DRG: 286 | Disposition: A | Discharge status: Home / Self Care | Payer: Medicare Other | Attending: Internal Medicine | Admitting: Internal Medicine

## 2015-01-26 ENCOUNTER — Inpatient Hospital Stay (HOSPITAL_COMMUNITY)
Admit: 2015-01-26 | Discharge: 2015-01-26 | Disposition: A | Discharge status: Home / Self Care | Payer: Medicare Other | Attending: Physician Assistant | Admitting: Physician Assistant

## 2015-01-26 ENCOUNTER — Encounter: Payer: Self-pay | Admitting: *Deleted

## 2015-01-26 ENCOUNTER — Emergency Department: Payer: Medicare Other

## 2015-01-26 DIAGNOSIS — D649 Anemia, unspecified: Secondary | ICD-10-CM | POA: Diagnosis present

## 2015-01-26 DIAGNOSIS — R0602 Shortness of breath: Secondary | ICD-10-CM | POA: Diagnosis not present

## 2015-01-26 DIAGNOSIS — I25111 Atherosclerotic heart disease of native coronary artery with angina pectoris with documented spasm: Secondary | ICD-10-CM | POA: Diagnosis not present

## 2015-01-26 DIAGNOSIS — I5023 Acute on chronic systolic (congestive) heart failure: Secondary | ICD-10-CM | POA: Diagnosis present

## 2015-01-26 DIAGNOSIS — Z720 Tobacco use: Secondary | ICD-10-CM | POA: Diagnosis not present

## 2015-01-26 DIAGNOSIS — Z79899 Other long term (current) drug therapy: Secondary | ICD-10-CM | POA: Diagnosis not present

## 2015-01-26 DIAGNOSIS — F1010 Alcohol abuse, uncomplicated: Secondary | ICD-10-CM | POA: Diagnosis present

## 2015-01-26 DIAGNOSIS — E119 Type 2 diabetes mellitus without complications: Secondary | ICD-10-CM | POA: Diagnosis present

## 2015-01-26 DIAGNOSIS — R000 Tachycardia, unspecified: Secondary | ICD-10-CM | POA: Diagnosis not present

## 2015-01-26 DIAGNOSIS — Z7952 Long term (current) use of systemic steroids: Secondary | ICD-10-CM

## 2015-01-26 DIAGNOSIS — R079 Chest pain, unspecified: Secondary | ICD-10-CM | POA: Diagnosis not present

## 2015-01-26 DIAGNOSIS — I509 Heart failure, unspecified: Secondary | ICD-10-CM | POA: Diagnosis not present

## 2015-01-26 DIAGNOSIS — I209 Angina pectoris, unspecified: Secondary | ICD-10-CM

## 2015-01-26 DIAGNOSIS — M109 Gout, unspecified: Secondary | ICD-10-CM | POA: Diagnosis present

## 2015-01-26 DIAGNOSIS — F1210 Cannabis abuse, uncomplicated: Secondary | ICD-10-CM | POA: Diagnosis present

## 2015-01-26 DIAGNOSIS — E785 Hyperlipidemia, unspecified: Secondary | ICD-10-CM | POA: Diagnosis present

## 2015-01-26 DIAGNOSIS — Z23 Encounter for immunization: Secondary | ICD-10-CM

## 2015-01-26 DIAGNOSIS — Z7982 Long term (current) use of aspirin: Secondary | ICD-10-CM

## 2015-01-26 DIAGNOSIS — J441 Chronic obstructive pulmonary disease with (acute) exacerbation: Secondary | ICD-10-CM

## 2015-01-26 DIAGNOSIS — J449 Chronic obstructive pulmonary disease, unspecified: Secondary | ICD-10-CM | POA: Diagnosis present

## 2015-01-26 DIAGNOSIS — I5043 Acute on chronic combined systolic (congestive) and diastolic (congestive) heart failure: Secondary | ICD-10-CM | POA: Diagnosis not present

## 2015-01-26 DIAGNOSIS — I428 Other cardiomyopathies: Secondary | ICD-10-CM

## 2015-01-26 DIAGNOSIS — I110 Hypertensive heart disease with heart failure: Principal | ICD-10-CM | POA: Diagnosis present

## 2015-01-26 DIAGNOSIS — F17210 Nicotine dependence, cigarettes, uncomplicated: Secondary | ICD-10-CM | POA: Diagnosis present

## 2015-01-26 DIAGNOSIS — M1990 Unspecified osteoarthritis, unspecified site: Secondary | ICD-10-CM | POA: Diagnosis present

## 2015-01-26 DIAGNOSIS — I10 Essential (primary) hypertension: Secondary | ICD-10-CM | POA: Diagnosis not present

## 2015-01-26 DIAGNOSIS — I2510 Atherosclerotic heart disease of native coronary artery without angina pectoris: Secondary | ICD-10-CM | POA: Diagnosis not present

## 2015-01-26 DIAGNOSIS — I426 Alcoholic cardiomyopathy: Secondary | ICD-10-CM | POA: Diagnosis present

## 2015-01-26 DIAGNOSIS — Z8673 Personal history of transient ischemic attack (TIA), and cerebral infarction without residual deficits: Secondary | ICD-10-CM | POA: Diagnosis not present

## 2015-01-26 DIAGNOSIS — Z96641 Presence of right artificial hip joint: Secondary | ICD-10-CM | POA: Diagnosis present

## 2015-01-26 DIAGNOSIS — J9601 Acute respiratory failure with hypoxia: Secondary | ICD-10-CM | POA: Diagnosis not present

## 2015-01-26 DIAGNOSIS — F17200 Nicotine dependence, unspecified, uncomplicated: Secondary | ICD-10-CM | POA: Diagnosis not present

## 2015-01-26 HISTORY — DX: Chronic systolic (congestive) heart failure: I50.22

## 2015-01-26 LAB — CBC WITH DIFFERENTIAL/PLATELET
BASOS PCT: 1 %
Basophils Absolute: 0 10*3/uL (ref 0–0.1)
EOS ABS: 0.9 10*3/uL — AB (ref 0–0.7)
Eosinophils Relative: 16 %
HEMATOCRIT: 38.8 % — AB (ref 40.0–52.0)
HEMOGLOBIN: 12.7 g/dL — AB (ref 13.0–18.0)
LYMPHS ABS: 1.5 10*3/uL (ref 1.0–3.6)
Lymphocytes Relative: 25 %
MCH: 31.1 pg (ref 26.0–34.0)
MCHC: 32.7 g/dL (ref 32.0–36.0)
MCV: 95 fL (ref 80.0–100.0)
MONO ABS: 0.3 10*3/uL (ref 0.2–1.0)
MONOS PCT: 5 %
NEUTROS ABS: 3.1 10*3/uL (ref 1.4–6.5)
NEUTROS PCT: 53 %
Platelets: 207 10*3/uL (ref 150–440)
RBC: 4.08 MIL/uL — ABNORMAL LOW (ref 4.40–5.90)
RDW: 15.3 % — AB (ref 11.5–14.5)
WBC: 5.9 10*3/uL (ref 3.8–10.6)

## 2015-01-26 LAB — CREATININE, SERUM: CREATININE: 1.22 mg/dL (ref 0.61–1.24)

## 2015-01-26 LAB — COMPREHENSIVE METABOLIC PANEL
ALBUMIN: 3.7 g/dL (ref 3.5–5.0)
ALK PHOS: 56 U/L (ref 38–126)
ALT: 17 U/L (ref 17–63)
AST: 24 U/L (ref 15–41)
Anion gap: 8 (ref 5–15)
BILIRUBIN TOTAL: 0.6 mg/dL (ref 0.3–1.2)
BUN: 16 mg/dL (ref 6–20)
CO2: 26 mmol/L (ref 22–32)
CREATININE: 1.25 mg/dL — AB (ref 0.61–1.24)
Calcium: 9.3 mg/dL (ref 8.9–10.3)
Chloride: 110 mmol/L (ref 101–111)
GFR calc Af Amer: >60 mL/min (ref >60)
GLUCOSE: 110 mg/dL — AB (ref 65–99)
Potassium: 4 mmol/L (ref 3.5–5.1)
Sodium: 144 mmol/L (ref 135–145)
TOTAL PROTEIN: 6.6 g/dL (ref 6.5–8.1)

## 2015-01-26 LAB — ETHANOL: Alcohol, Ethyl (B): <5 mg/dL (ref <5)

## 2015-01-26 LAB — CBC
HEMATOCRIT: 41.5 % (ref 40.0–52.0)
HEMOGLOBIN: 14.2 g/dL (ref 13.0–18.0)
MCH: 31.9 pg (ref 26.0–34.0)
MCHC: 34.2 g/dL (ref 32.0–36.0)
MCV: 93.5 fL (ref 80.0–100.0)
Platelets: 233 10*3/uL (ref 150–440)
RBC: 4.44 MIL/uL (ref 4.40–5.90)
RDW: 15.1 % — ABNORMAL HIGH (ref 11.5–14.5)
WBC: 6.3 10*3/uL (ref 3.8–10.6)

## 2015-01-26 LAB — TROPONIN I: Troponin I: <0.03 ng/mL (ref <0.031)

## 2015-01-26 LAB — BRAIN NATRIURETIC PEPTIDE: B NATRIURETIC PEPTIDE 5: 1453 pg/mL — AB (ref 0.0–100.0)

## 2015-01-26 LAB — TSH: TSH: 0.224 u[IU]/mL — AB (ref 0.350–4.500)

## 2015-01-26 LAB — GAMMA GT: GGT: 75 U/L — ABNORMAL HIGH (ref 7–50)

## 2015-01-26 LAB — MAGNESIUM: MAGNESIUM: 1.8 mg/dL (ref 1.7–2.4)

## 2015-01-26 MED ORDER — ASPIRIN EC 81 MG PO TBEC: 81.0000 mg | DELAYED_RELEASE_TABLET | Freq: Once | ORAL | Status: DC

## 2015-01-26 MED ORDER — OMEGA-3-ACID ETHYL ESTERS 1 G PO CAPS
1.0000 g | ORAL_CAPSULE | Freq: Every day | ORAL | Status: DC
  Administered 2015-01-27 – 2015-01-28 (×2): 1 g via ORAL
  Filled 2015-01-26 (×2): qty 1

## 2015-01-26 MED ORDER — COLCHICINE 0.6 MG PO TABS
0.6000 mg | ORAL_TABLET | Freq: Two times a day (BID) | ORAL | Status: DC
  Administered 2015-01-26 – 2015-01-27 (×3): 0.6 mg via ORAL
  Filled 2015-01-26 (×3): qty 1

## 2015-01-26 MED ORDER — ACETAMINOPHEN 650 MG RE SUPP: 650.0000 mg | Freq: Four times a day (QID) | RECTAL | Status: DC | PRN

## 2015-01-26 MED ORDER — ATORVASTATIN CALCIUM 10 MG PO TABS
10.0000 mg | ORAL_TABLET | Freq: Every day | ORAL | Status: DC
  Administered 2015-01-26 – 2015-01-27 (×2): 10 mg via ORAL
  Filled 2015-01-26 (×2): qty 1

## 2015-01-26 MED ORDER — ENOXAPARIN SODIUM 40 MG/0.4ML ~~LOC~~ SOLN
40.0000 mg | SUBCUTANEOUS | Status: DC
  Administered 2015-01-27 – 2015-01-28 (×2): 40 mg via SUBCUTANEOUS
  Filled 2015-01-26 (×2): qty 0.4

## 2015-01-26 MED ORDER — ONDANSETRON HCL 4 MG PO TABS: 4.0000 mg | ORAL_TABLET | Freq: Four times a day (QID) | ORAL | Status: DC | PRN

## 2015-01-26 MED ORDER — BISACODYL 5 MG PO TBEC: 5.0000 mg | DELAYED_RELEASE_TABLET | Freq: Every day | ORAL | Status: DC | PRN

## 2015-01-26 MED ORDER — ALBUTEROL SULFATE HFA 108 (90 BASE) MCG/ACT IN AERS: 2.0000 | INHALATION_SPRAY | Freq: Four times a day (QID) | RESPIRATORY_TRACT | Status: DC | PRN

## 2015-01-26 MED ORDER — VITAMIN B-1 100 MG PO TABS
100.0000 mg | ORAL_TABLET | Freq: Every day | ORAL | Status: DC
  Administered 2015-01-27 – 2015-01-28 (×2): 100 mg via ORAL
  Filled 2015-01-26 (×2): qty 1

## 2015-01-26 MED ORDER — ACETAMINOPHEN 325 MG PO TABS
650.0000 mg | ORAL_TABLET | Freq: Four times a day (QID) | ORAL | Status: DC | PRN
  Administered 2015-01-27: 650 mg via ORAL
  Filled 2015-01-26: qty 2

## 2015-01-26 MED ORDER — FENOFIBRATE 54 MG PO TABS
54.0000 mg | ORAL_TABLET | Freq: Every day | ORAL | Status: DC
  Administered 2015-01-27: 54 mg via ORAL
  Filled 2015-01-26: qty 1

## 2015-01-26 MED ORDER — IBUPROFEN 600 MG PO TABS
600.0000 mg | ORAL_TABLET | Freq: Once | ORAL | Status: AC
  Administered 2015-01-26: 600 mg via ORAL
  Filled 2015-01-26: qty 1

## 2015-01-26 MED ORDER — ASPIRIN EC 325 MG PO TBEC
325.0000 mg | DELAYED_RELEASE_TABLET | Freq: Every day | ORAL | Status: DC
  Administered 2015-01-27: 325 mg via ORAL
  Filled 2015-01-26 (×2): qty 1

## 2015-01-26 MED ORDER — ALBUTEROL SULFATE (2.5 MG/3ML) 0.083% IN NEBU: 2.5000 mg | INHALATION_SOLUTION | Freq: Four times a day (QID) | RESPIRATORY_TRACT | Status: DC | PRN

## 2015-01-26 MED ORDER — ONE-A-DAY MENS PO TABS
1.0000 | ORAL_TABLET | Freq: Every day | ORAL | Status: DC
  Administered 2015-01-27: 1 via ORAL
  Filled 2015-01-26 (×3): qty 1

## 2015-01-26 MED ORDER — FUROSEMIDE 10 MG/ML IJ SOLN
20.0000 mg | Freq: Three times a day (TID) | INTRAMUSCULAR | Status: DC
  Administered 2015-01-26 – 2015-01-27 (×3): 20 mg via INTRAVENOUS
  Filled 2015-01-26 (×3): qty 2

## 2015-01-26 MED ORDER — SODIUM CHLORIDE 0.9 % IV SOLN: 250.0000 mL | INTRAVENOUS | Status: DC | PRN

## 2015-01-26 MED ORDER — CARVEDILOL 3.125 MG PO TABS
3.1250 mg | ORAL_TABLET | Freq: Two times a day (BID) | ORAL | Status: DC
  Administered 2015-01-26 – 2015-01-28 (×4): 3.125 mg via ORAL
  Filled 2015-01-26 (×4): qty 1

## 2015-01-26 MED ORDER — METHYLPREDNISOLONE SODIUM SUCC 125 MG IJ SOLR
125.0000 mg | Freq: Once | INTRAMUSCULAR | Status: AC
  Administered 2015-01-26: 125 mg via INTRAVENOUS
  Filled 2015-01-26: qty 2

## 2015-01-26 MED ORDER — FUROSEMIDE 10 MG/ML IJ SOLN
60.0000 mg | Freq: Once | INTRAMUSCULAR | Status: AC
  Administered 2015-01-26: 60 mg via INTRAVENOUS
  Filled 2015-01-26: qty 8

## 2015-01-26 MED ORDER — SODIUM CHLORIDE 0.9 % IJ SOLN: 3.0000 mL | INTRAMUSCULAR | Status: DC | PRN

## 2015-01-26 MED ORDER — LISINOPRIL 5 MG PO TABS
5.0000 mg | ORAL_TABLET | Freq: Every day | ORAL | Status: DC
  Administered 2015-01-27: 5 mg via ORAL
  Filled 2015-01-26: qty 1

## 2015-01-26 MED ORDER — ISOSORBIDE MONONITRATE ER 30 MG PO TB24
30.0000 mg | ORAL_TABLET | Freq: Every day | ORAL | Status: DC
  Administered 2015-01-26 – 2015-01-27 (×2): 30 mg via ORAL
  Filled 2015-01-26 (×2): qty 1

## 2015-01-26 MED ORDER — CYCLOBENZAPRINE HCL 10 MG PO TABS: 10.0000 mg | ORAL_TABLET | Freq: Three times a day (TID) | ORAL | Status: DC | PRN

## 2015-01-26 MED ORDER — ALBUTEROL SULFATE (2.5 MG/3ML) 0.083% IN NEBU
5.0000 mg | INHALATION_SOLUTION | Freq: Once | RESPIRATORY_TRACT | Status: AC
  Administered 2015-01-26: 5 mg via RESPIRATORY_TRACT
  Filled 2015-01-26: qty 6

## 2015-01-26 MED ORDER — MORPHINE SULFATE (PF) 4 MG/ML IV SOLN
4.0000 mg | Freq: Once | INTRAVENOUS | Status: AC
  Administered 2015-01-26: 4 mg via INTRAVENOUS
  Filled 2015-01-26: qty 1

## 2015-01-26 MED ORDER — ONDANSETRON HCL 4 MG/2ML IJ SOLN: 4.0000 mg | Freq: Four times a day (QID) | INTRAMUSCULAR | Status: DC | PRN

## 2015-01-26 MED ORDER — SODIUM CHLORIDE 0.9 % IJ SOLN: 3.0000 mL | Freq: Two times a day (BID) | INTRAMUSCULAR | Status: DC

## 2015-01-26 MED ORDER — HYDROCODONE-ACETAMINOPHEN 5-325 MG PO TABS
1.0000 | ORAL_TABLET | ORAL | Status: DC | PRN
  Administered 2015-01-26 – 2015-01-28 (×7): 2 via ORAL
  Filled 2015-01-26 (×8): qty 2

## 2015-01-26 MED ORDER — POTASSIUM CHLORIDE CRYS ER 10 MEQ PO TBCR
10.0000 meq | EXTENDED_RELEASE_TABLET | Freq: Three times a day (TID) | ORAL | Status: DC
  Administered 2015-01-26 – 2015-01-28 (×7): 10 meq via ORAL
  Filled 2015-01-26 (×7): qty 1

## 2015-01-26 MED ORDER — FOLIC ACID 1 MG PO TABS
1.0000 mg | ORAL_TABLET | Freq: Every day | ORAL | Status: DC
  Administered 2015-01-27 – 2015-01-28 (×2): 1 mg via ORAL
  Filled 2015-01-26 (×2): qty 1

## 2015-01-26 MED ORDER — ASPIRIN EC 81 MG PO TBEC
81.0000 mg | DELAYED_RELEASE_TABLET | Freq: Once | ORAL | Status: DC
  Filled 2015-01-26: qty 1

## 2015-01-26 NOTE — Consult Note (Signed)
Cardiology Consultation Note  Patient ID: Travis [name], MRN: [id number], DOB/AGE: 06-02-1955 59 y.o. Admit date: 01/26/2015   Date of Consult: 01/26/2015 Primary Physician: [name], NP Primary Cardiologist: Dr. Rockey Situ, MD  Chief Complaint: SOB Reason for Consult: Acute on chronic systolic CHF  HPI: 59 y.o. male with h/o nonischemic, ETOH cardiomyopathy by cardiac cath in 0000000, chronic systolic CHF with EF of [id number] as of June 2015 by echo, history of TIA, history of TIA, medication noncompliance, tobacco abuse, ETOH abuse, HLD, and history of prior hip fracture in 2014 who presented to [facility] ED on 10/31 with increased SOB and chest pain and was discharged home. He returned to [facility] ED on 11/2 with cough that woke him up around 3 AM. Work up showed new development of interstitial pulmonary edema. Cardiology is consulted for further evaluation.    Patient just presented to the ED on 10/31 with chest discomfort, SOB and rash. He had negative troponin x 1, negative CXR, ECG with sinus tachycardia, 101 bpm, biatrial enlargement, left axis deviation, inferolateral st changes. This was unchanged from his prior. He was discharged from the ED with outpatient follow up.   He returned to the ED on 11/2 with worsening SOB and chest pain upon waking up. Symptoms have been nonexertional in nature. No associated nausea, vomiting, diaphoresis, presyncope, or syncope. He denies any orthopnea, PND, or early satiety. He eats a large amount of salt daily along with a fair amount of fluids. He denies eating out at restaurants. He does not take any po Lasix at home.   Upon the patient's arrival to [facility] they were found to have negative troponin x 1, BNP 1453, SCr 1.25, K+ 4.0 hgb 12.7. ECG showed sinus tachycardia, 101 bpm, biatrial enlargement, LVH, left axis deviation, inferolateral st changes , CXR showed development of extensive interstitial pulmonary edema since the study on 01/24/2015. He was noted to be  hypoxic in the 80's on room air. He received IV Lasix in the ED. He was started on IV Lasix 20 mg q 8 hours. BP has been in the 123456 to [id number] systolic.    Past Medical History  Diagnosis Date  . Systolic heart failure     EF 20-25% by echo 12/28/10  . Nonischemic cardiomyopathy ([place])     minimal coronary disease by cath 12/29/10  . Hemoptysis     secondary to pulmonary edema  . Hyperlipidemia   . Chronic anemia   . GI bleed     15 years ago  . Tobacco abuse   . Alcohol abuse   . Osteoarthritis   . Gout   . Diabetes mellitus, type 2 ([place])     pt reports his DM is gone  . 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 No discrete RWMA;s Diffuse hypokinesis worse in septum and inferior walls EF 45-50%  Impressions:  - Limited study No doppler or M mode Moderate LVE. No discrete RWMA;s Diffuse hypokinesis worse in septum and inferior walls EF 45-50%  Echo 04/2011: Study Conclusions  - Left ventricle: The cavity size was normal. There was mild focal basal andmild concentric hypertrophy. Systolic function was normal. The estimated ejection fraction was in the range of 50% to 55%. Basal inferior, posterior and septal wall hypokinesis. Most of the myocardium has good wall motion. - Mitral valve: Anterior leaflet redundancy. Trivial regurgitation. Mild prolapse  of anterior leaflet. - Right ventricle: Systolic function was normal. - Pulmonary arteries: Systolic pressure was within the normal range.  Cardiac cath 12/30/2010: FINDINGS: 1. Hemodynamics, LV 132/31, aorta 126/83. 2. Left ventriculography was not done as the patient had a recent high-  quality echocardiogram. 3. Right coronary artery. The right coronary was a large dominant  vessel with no angiographic coronary disease. 4. Left main. Left main had  no angiographic coronary disease. 5. Left circumflex system. There is a small ramus and a large first  obtuse marginal. There was no angiographic coronary artery disease  in the circumflex system. 6. LAD system. There was a 20% proximal LAD stenosis at the takeoff  of the moderate-sized first diagonal. The remainder of the LAD had  no significant disease.  IMPRESSION: The patient has minimal coronary artery disease. His cardiomyopathy appears to be nonischemic. He continues to have elevated filling pressures with LV end-diastolic pressure of 31 mmHg. We will continue his IV diuresis with Lasix 80 mg IV b.i.d.    Surgical History:  Past Surgical History  Procedure Laterality Date  . Cardiac catheterization    . Hip arthroplasty Right 03/15/2013    Procedure: ARTHROPLASTY BIPOLAR HIP;  Surgeon: [name], MD;  Location: [place];  Service: Orthopedics;  Laterality: Right;     Home Meds: Prior to Admission medications   Medication Sig Start Date End Date Taking? Authorizing Provider  albuterol (PROVENTIL HFA;VENTOLIN HFA) 108 (90 BASE) MCG/ACT inhaler Inhale 2 puffs into the lungs every 6 (six) hours as needed for wheezing or shortness of breath. Take 2 puffs every 5-10 minutes up to 6 puffs total over 15 minutes when needed.  Use with a spacer. 01/24/15  Yes Ahmed Prima, MD  aspirin 325 MG EC tablet Take 325 mg by mouth daily.   Yes Historical Provider, MD  atorvastatin (LIPITOR) 10 MG tablet Take 1 tablet (10 mg total) by mouth daily at 6 PM. Patient taking differently: Take 10 mg by mouth at bedtime.  05/27/14  Yes Amy D Clegg, NP  carvedilol (COREG) 3.125 MG tablet Take 1 tablet (3.125 mg total) by mouth 2 (two) times daily with a meal. 12/29/14  Yes Amy D Clegg, NP  colchicine 0.6 MG tablet TAKE 1 TABLET BY MOUTH TWICE A DAY 11/26/14  Yes [name], NP  cyclobenzaprine (FLEXERIL) 10 MG tablet Take 1 tablet (10 mg total) by mouth 3 (three) times  daily as needed for muscle spasms. 12/31/13  Yes [name], NP  diphenoxylate-atropine (LOMOTIL) 2.5-0.025 MG per tablet Take 1 tablet by mouth 4 (four) times daily as needed for diarrhea or loose stools. 05/03/14  Yes [name], NP  etodolac (LODINE) 500 MG tablet Take 1 tablet (500 mg total) by mouth 2 (two) times daily. SCHEDULE FOLLOW UP OFFICE VISIT FOR MORE REFILLS [phone] Patient taking differently: Take 500 mg by mouth daily. SCHEDULE FOLLOW UP OFFICE VISIT FOR MORE REFILLS [phone] 10/27/14  Yes [name], NP  fenofibrate 54 MG tablet Take 1 tablet (54 mg total) by mouth daily. 12/28/14  Yes Amy D Clegg, NP  folic acid (FOLVITE) 1 MG tablet Take 1 tablet (1 mg total) by mouth daily. 12/28/14  Yes Amy D Clegg, NP  ibuprofen (ADVIL,MOTRIN) 800 MG tablet Take 800 mg by mouth every 8 (eight) hours as needed for fever, headache, mild pain, moderate pain or cramping.    Yes Historical Provider, MD  indomethacin (INDOCIN) 50 MG capsule TAKE 1 CAPSULE  BY MOUTH 3 TIMES A DAY WITH FOOD Patient taking differently: TAKE 1 CAPSULE BY MOUTH DAILY 11/26/14  Yes [name], NP  lisinopril (PRINIVIL,ZESTRIL) 5 MG tablet Take 1 tablet (5 mg total) by mouth daily. SCHEDULE FOLLOW UP OFFICE VISIT FOR MORE REFILLS [phone] 10/27/14  Yes [name], NP  Multiple Vitamin (ONE-A-DAY MENS PO) Take 1 tablet by mouth daily.    Yes Historical Provider, MD  Omega-3 Fatty Acids (FISH OIL PO) Take 1 capsule by mouth daily.   Yes Historical Provider, MD  ondansetron (ZOFRAN ODT) 4 MG disintegrating tablet Take 1 tablet (4 mg total) by mouth every 8 (eight) hours as needed for nausea or vomiting. 05/03/14  Yes [name], NP  thiamine 100 MG tablet Take 1 tablet (100 mg total) by mouth daily. 09/16/13  Yes Thurnell Lose, MD    Inpatient Medications:  . [START ON 01/27/2015] aspirin  325 mg Oral Daily  . aspirin EC  81 mg Oral Once  . atorvastatin  10 mg Oral QHS  . carvedilol  3.125 mg Oral  BID WC  . colchicine  0.6 mg Oral BID  . enoxaparin (LOVENOX) injection  40 mg Subcutaneous Q24H  . [START ON 01/27/2015] fenofibrate  54 mg Oral Daily  . [START ON 0000000 folic acid  1 mg Oral Daily  . furosemide  20 mg Intravenous Q8H  . [START ON 01/27/2015] lisinopril  5 mg Oral Daily  . [START ON 01/27/2015] multivitamin  1 tablet Oral Daily  . [START ON 01/27/2015] omega-3 acid ethyl esters  1 g Oral Daily  . sodium chloride  3 mL Intravenous Q12H  . [START ON 01/27/2015] thiamine  100 mg Oral Daily      Allergies: No Known Allergies  Social History   Social History  . Marital Status: Single    Spouse Name: N/A  . Number of Children: N/A  . Years of Education: N/A   Occupational History  . Not on file.   Social History Main Topics  . Smoking status: Current Every Day Smoker -- 1.00 packs/day for 30 years    Types: Cigarettes  . Smokeless tobacco: Never Used  . Alcohol Use: 0.6 oz/week    1 Glasses of wine per week     Comment: occasionally  . Drug Use: No  . Sexual Activity: Yes   Other Topics Concern  . Not on file   Social History Narrative     Family History  Problem Relation Age of Onset  . Diabetes Mother   . Hypertension Mother   . Diabetes Father   . Hypertension Father   . Cancer Neg Hx   . Heart disease Neg Hx   . Stroke Neg Hx      Review of Systems: Review of Systems  Constitutional: Positive for malaise/fatigue. Negative for fever, chills, weight loss and diaphoresis.  HENT: Negative for congestion.   Eyes: Negative for discharge and redness.  Respiratory: Positive for cough and shortness of breath. Negative for hemoptysis, sputum production and wheezing.   Cardiovascular: Positive for chest pain and palpitations. Negative for orthopnea, claudication, leg swelling and PND.  Gastrointestinal: Negative for heartburn, nausea, vomiting and abdominal pain.  Musculoskeletal: Positive for myalgias. Negative for falls.  Skin: Negative for rash.    Neurological: Positive for weakness. Negative for dizziness, tingling, tremors, sensory change, speech change, focal weakness and loss of consciousness.  Endo/Heme/Allergies: Does not bruise/bleed easily.  Psychiatric/Behavioral: Positive for substance abuse. The patient is  not nervous/anxious.   All other systems reviewed and are negative.  All other systems were reviewed and negative.   Labs:  Recent Labs  01/24/15 0934 01/26/15 1020  TROPONINI <0.03 <0.03   Lab Results  Component Value Date   WBC 5.9 01/26/2015   HGB 12.7* 01/26/2015   HCT 38.8* 01/26/2015   MCV 95.0 01/26/2015   PLT 207 01/26/2015    Recent Labs Lab 01/26/15 1020  NA 144  K 4.0  CL 110  CO2 26  BUN 16  CREATININE 1.25*  CALCIUM 9.3  PROT 6.6  BILITOT 0.6  ALKPHOS 56  ALT 17  AST 24  GLUCOSE 110*   Lab Results  Component Value Date   CHOL 153 12/31/2013   HDL 28.70* 12/31/2013   LDLCALC UNABLE TO CALCULATE IF TRIGLYCERIDE OVER 400 mg/dL 09/14/2013   TRIG 221.0* 12/31/2013   No results found for: DDIMER  Radiology/Studies:  Dg Chest 2 View  01/26/2015  CLINICAL DATA:  Shortness of breath.  Chest and back pain. EXAM: CHEST  2 VIEW COMPARISON:  01/24/2015 FINDINGS: Since the prior study the patient has developed diffuse interstitial pulmonary edema with Kerley B-lines at the bases. The heart size and pulmonary vascularity are normal. No effusions. IMPRESSION: The patient has developed extensive interstitial pulmonary edema since the study performed on 01/24/2015. Electronically Signed   By: Lorriane Shire M.D.   On: 01/26/2015 10:55   Dg Chest 2 View  01/24/2015  CLINICAL DATA:  Shortness of breath since 1 o'clock this morning. EXAM: CHEST  2 VIEW COMPARISON:  03/12/2014 FINDINGS: The lungs are hyperinflated likely secondary to COPD. There is no focal parenchymal opacity. There is no pleural effusion or pneumothorax. The heart and mediastinal contours are unremarkable. The osseous structures  are unremarkable. IMPRESSION: No active cardiopulmonary disease. Electronically Signed   By: Kathreen Devoid   On: 01/24/2015 10:09    EKG: sinus tachycardia, 101 bpm, biatrial enlargement, left axis deviation, inferolateral st changes  Weights: Filed Weights   01/26/15 0923 01/26/15 1400  Weight: 170 lb (77.111 kg) 174 lb 4.8 oz (79.062 kg)     Physical Exam: Blood pressure 146/103, pulse 108, temperature 98.2 F (36.8 C), temperature source Oral, resp. rate 20, height 6\' 4"  (1.93 m), weight 174 lb 4.8 oz (79.062 kg), SpO2 97 %. Body mass index is 21.23 kg/(m^2). General: Well developed, well nourished, in no acute distress. Head: Normocephalic, atraumatic, sclera non-icteric, no xanthomas, nares are without discharge.  Neck: Negative for carotid bruits. JVD not elevated. Lungs: Bilateral crackles. Breathing is unlabored. Heart: RRR with S1 S2. No murmurs, rubs, or gallops appreciated. Abdomen: Soft, non-tender, non-distended with normoactive bowel sounds. No hepatomegaly. No rebound/guarding. No obvious abdominal masses. Msk:  Strength and tone appear normal for age. Extremities: No clubbing or cyanosis. No edema.  Distal pedal pulses are 2+ and equal bilaterally. Neuro: Alert and oriented X 3. No facial asymmetry. No focal deficit. Moves all extremities spontaneously. Psych:  Responds to questions appropriately with a normal affect.    Assessment and Plan:   1. Acute respiratory distress with hypoxia: -In the setting of acute on chronic systolic CHF -On O2 via nasal cannula via 3L -Wean as tolerated -Nebs per IM  2. Acute on chronic systolic CHF/ETOH cardiomyopathy: -Chest xray with bilateral pulmonary edema, BNP 1400 -Agree with IV Lasix 20 mg q 8 hours currently -Monitor SCr with diuresis -Replete K+ to goal of 4.0 with diuresis  -Add Imdur 30 mg daily -Check echo  to evaluate LV function, wall motion, and right-sided pressure -Continue Coreg 3.125 mg bid, look to titrate  up as he improves. Heart rate should improve as he diureses  -Continue lisinopril  -Monitor I' and O's  3. Chest pain: -Troponin negative thus far -Schedule for Lexiscan Myoview to evaluate for high risk ischemia -Continue to cycle troponin -Imdur added as above  4. HTN: -Imdur added as above -Titrate Coreg as his condition improves -Continue lisinopril   5. History of medication noncompliance:  6. ETOH abuse: -Check ETOH level and GGT  7. Tobacco abuse: -Cessation advised  8. COPD: -Nebs per IM    Signed, Christell Faith, PA-C Pager: [phone] 01/26/2015, 3:18 PM

## 2015-01-26 NOTE — ED Provider Notes (Addendum)
[facility] Emergency Department Provider Note  ____________________________________________   I have reviewed the triage vital signs and the nursing notes.   HISTORY  Chief Complaint Shortness of Breath    HPI Travis [name] is a 59 y.o. male with a history of cardiomyopathy in the past, as well as ongoing tobacco abuse 20 years, recurrent bronchitis who is required nebulizer treatments in the past presents today complaining of a cough that woke him up this morning at 3:00. Nonproductive. He states this is similar to prior events. He was seen here a few days ago with similar. He states that the breathing treatments helped but he "has used them up". He has had no leg swelling. He denies lower extremity edema. No history of blood clot or PE and self or family. No recent travel no unilateral leg swelling. No recent surgery. The patient states that he did take a nebulizer prior to coming in. He has had he states only after a particularly violent cough some discomfort in the right chest wall. Nonradiating nonexertional worsens when he moves or changes position or touches that area. He does state with an inhaler after his last visit here he was back to baseline.  Past Medical History  Diagnosis Date  . Systolic heart failure     EF 20-25% by echo 12/28/10  . Nonischemic cardiomyopathy ([place])     minimal coronary disease by cath 12/29/10  . Hemoptysis     secondary to pulmonary edema  . Hyperlipidemia   . Chronic anemia   . GI bleed     15 years ago  . Tobacco abuse   . Alcohol abuse   . Osteoarthritis   . Gout   . Diabetes mellitus, type 2 ([place])     pt reports his DM is gone  . 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 Sinusitis 10/27/2014  . Literacy level of illiterate 06/04/2014  . Noncompliance 10/22/2013  . 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 Cardiomyopathy, nonischemic ([place])  03/15/2013  . Smoker 03/14/2013  . Alcoholism in remission ([place]) 03/14/2013  . Chronic systolic heart failure ([place]) 01/05/2011  . DM2 (diabetes mellitus, type 2) ([place]) 11/30/2008  . HLD (hyperlipidemia) 11/30/2008  . Gout 11/30/2008  . Iron deficiency anemia, unspecified 11/30/2008    Past Surgical History  Procedure Laterality Date  . Cardiac catheterization    . Hip arthroplasty Right 03/15/2013    Procedure: ARTHROPLASTY BIPOLAR HIP;  Surgeon: [name], MD;  Location: [facility];  Service: Orthopedics;  Laterality: Right;    Current Outpatient Rx  Name  Route  Sig  Dispense  Refill  . albuterol (PROVENTIL HFA;VENTOLIN HFA) 108 (90 BASE) MCG/ACT inhaler   Inhalation   Inhale 2 puffs into the lungs every 6 (six) hours as needed for wheezing or shortness of breath. Take 2 puffs every 5-10 minutes up to 6 puffs total over 15 minutes when needed.  Use with a spacer.   1 Inhaler   2   . aspirin 325 MG EC tablet   Oral   Take 325 mg by mouth daily.         [name] atorvastatin (LIPITOR) 10 MG tablet   Oral   Take 1 tablet (10 mg total) by mouth daily at 6 PM. Patient taking differently: Take 10 mg by mouth at bedtime.    30 tablet   6   . carvedilol (COREG) 3.125 MG  tablet   Oral   Take 1 tablet (3.125 mg total) by mouth 2 (two) times daily with a meal.   60 tablet   6   . colchicine 0.6 MG tablet      TAKE 1 TABLET BY MOUTH TWICE A DAY   60 tablet   1   . cyclobenzaprine (FLEXERIL) 10 MG tablet   Oral   Take 1 tablet (10 mg total) by mouth 3 (three) times daily as needed for muscle spasms.   30 tablet   0   . diphenoxylate-atropine (LOMOTIL) 2.5-0.025 MG per tablet   Oral   Take 1 tablet by mouth 4 (four) times daily as needed for diarrhea or loose stools.   30 tablet   0   . etodolac (LODINE) 500 MG tablet   Oral   Take 1 tablet (500 mg total) by mouth 2 (two) times daily. SCHEDULE FOLLOW UP OFFICE VISIT FOR MORE REFILLS [phone]   60 tablet    0   . fenofibrate 54 MG tablet   Oral   Take 1 tablet (54 mg total) by mouth daily.   30 tablet   6   . folic acid (FOLVITE) 1 MG tablet   Oral   Take 1 tablet (1 mg total) by mouth daily.   30 tablet   6   . ibuprofen (ADVIL,MOTRIN) 800 MG tablet   Oral   Take 800 mg by mouth every 8 (eight) hours as needed for fever, headache, mild pain, moderate pain or cramping.       0   . indomethacin (INDOCIN) 50 MG capsule      TAKE 1 CAPSULE BY MOUTH 3 TIMES A DAY WITH FOOD Patient taking differently: TAKE 1 CAPSULE BY MOUTH DAILY   90 capsule   1   . lisinopril (PRINIVIL,ZESTRIL) 5 MG tablet   Oral   Take 1 tablet (5 mg total) by mouth daily. SCHEDULE FOLLOW UP OFFICE VISIT FOR MORE REFILLS [phone]   30 tablet   0   . Multiple Vitamin (ONE-A-DAY MENS PO)   Oral   Take 1 tablet by mouth daily.          . Omega-3 Fatty Acids (FISH OIL PO)   Oral   Take 1 capsule by mouth daily.         . ondansetron (ZOFRAN ODT) 4 MG disintegrating tablet   Oral   Take 1 tablet (4 mg total) by mouth every 8 (eight) hours as needed for nausea or vomiting.   20 tablet   0   . predniSONE (DELTASONE) 20 MG tablet      Take 60 mg on days 1-5,  Take 40 mg on days 6-10, take 20 mg on days 11-15. Patient not taking: Reported on 01/24/2015   30 tablet   0   . thiamine 100 MG tablet   Oral   Take 1 tablet (100 mg total) by mouth daily.   30 tablet   0     Allergies Review of patient's allergies indicates no known allergies.  Family History  Problem Relation Age of Onset  . Diabetes Mother   . Hypertension Mother   . Diabetes Father   . Hypertension Father   . Cancer Neg Hx   . Heart disease Neg Hx   . Stroke Neg Hx     Social History Social History  Substance Use Topics  . Smoking status: Current Every Day Smoker -- 1.00 packs/day for 30 years  Types: Cigarettes  . Smokeless tobacco: Never Used  . Alcohol Use: 0.6 oz/week    1 Glasses of wine per week      Comment: occasionally    Review of Systems Constitutional: No fever/chills Eyes: No visual changes. ENT: No sore throat. No stiff neck no neck pain Cardiovascular: See history of present illness Respiratory: See history of present illness Gastrointestinal:   no vomiting.  No diarrhea.  No constipation. Genitourinary: Negative for dysuria. Musculoskeletal: Negative lower extremity swelling Skin: Negative for rash. Neurological: Negative for headaches, focal weakness or numbness. 10-point ROS otherwise negative.  ____________________________________________   PHYSICAL EXAM:  VITAL SIGNS: ED Triage Vitals  Enc Vitals Group     BP 01/26/15 0923 144/112 mmHg     Pulse Rate 01/26/15 0923 104     Resp 01/26/15 0923 20     Temp 01/26/15 0923 98 F (36.7 C)     Temp Source 01/26/15 0923 Oral     SpO2 01/26/15 0923 92 %     Weight 01/26/15 0923 170 lb (77.111 kg)     Height 01/26/15 0923 6\' 1"  (1.854 m)     Head Cir --      Peak Flow --      Pain Score --      Pain Loc --      Pain Edu? --      Excl. in [place]? --     Constitutional: Alert and oriented. Well appearing and in no acute distress. Eyes: Conjunctivae are normal. PERRL. EOMI. Head: Atraumatic. Nose: No congestion/rhinnorhea. Mouth/Throat: Mucous membranes are moist.  Oropharynx non-erythematous. Neck: No stridor.   Nontender with no meningismus Cardiovascular: Normal rate, regular rhythm. Grossly normal heart sounds.  Good peripheral circulation. Chest: Tender to palpation the chest wall on the right as well as around the scapula on the right, no shingles lesions noted, no bruising, no crepitus no flail chest no fracture palpated. When I touch this area patient states "ouch that's the pain right there" and pulls back. Also can elicit this pain but changing his position in the bed when lifting his arm Respiratory: Normal respiratory effort.  No retractions. Somewhat coarse in the bases slight wheeze  auscultated Abdominal: Soft and nontender. No distention. No guarding no rebound Back:  There is no focal tenderness or step off there is no midline tenderness there are no lesions noted. there is no CVA tenderness Musculoskeletal: No lower extremity tenderness. No joint effusions, no DVT signs strong distal pulses no edema Neurologic:  Normal speech and language. No gross focal neurologic deficits are appreciated.  Skin:  Skin is warm, dry and intact. No rash noted. Psychiatric: Mood and affect are normal. Speech and behavior are normal.  ____________________________________________   LABS (all labs ordered are listed, but only abnormal results are displayed)  Labs Reviewed  COMPREHENSIVE METABOLIC PANEL  BRAIN NATRIURETIC PEPTIDE  TROPONIN I  CBC WITH DIFFERENTIAL/PLATELET   ____________________________________________  EKG I personally interpreted EKG. Sinus tachycardia rate 101 there are downsloping T waves in 45 and 6 consistent with possible strain pattern which is appeared on prior EKG. EKG shows no acute ST elevation or depression, ____________________________________________  RADIOLOGY  Personally reviewed x-rays ____________________________________________   PROCEDURES  Procedure(s) performed: None  Critical Care performed: None  ____________________________________________   INITIAL IMPRESSION / ASSESSMENT AND PLAN / ED COURSE  Pertinent labs & imaging results that were available during my care of the patient were reviewed by me and considered in my medical decision making (  see chart for details).  Patient presents with cough, wheeze, and reversal chest wall pain. Has a history of recurrent chronic bronchitis and likely assaulting COPD given his ongoing smoking history. Low suspicion for PE or dissection. Patient had slight tachycardia however, he did use a neb prior to arrival. We will give her albuterol, as he still feels somewhat coarse although he states  his breathing is nearly back to baseline, I will give him Ultram for his painI will give him Solu-Medrol for recurrent COPD symptoms we will obtain a chest x-ray and as a precaution cardiac workup although now he has had this pain for over 6 hours, I am not certain that he will require repeat cardiac enzymes. Pain is very consistent with muscle skeletal pain after a cough and that is the exact mechanism that he describes. ____________________________________________  ----------------------------------------- 11:35 AM on 01/26/2015 -----------------------------------------  Chest x-ray shows fluid on the lungs, patient does have a history of cardiomyopathy, we will admit after IV Lasix.  Sats are 90% on room air we will give him 2 L of oxygen pending diuresis, patient is also complaining of "arthritis" from being on the stretcher. He is not having chest pain we will give him pain medication and reassess. He has already had 325 aspirin today.   FINAL CLINICAL IMPRESSION(S) / ED DIAGNOSES  Final diagnoses:  None     Schuyler Amor, MD 01/26/15 [place], MD 01/26/15 [place], MD 01/26/15 [id number]

## 2015-01-26 NOTE — ED Notes (Signed)
Pt seen Monday for shortness of breath, pt reports shortness of breath started today

## 2015-01-26 NOTE — H&P (Signed)
[facility] [place] NAME: Travis [name]    MR#:  [id number]  DATE OF BIRTH:  08-14-1955  DATE OF ADMISSION:  01/26/2015  PRIMARY CARE PHYSICIAN: [name], NP   REQUESTING/REFERRING PHYSICIAN: Dr. Burlene Arnt  CHIEF COMPLAINT:   Shortness of breath on and off for 3-4 days HISTORY OF PRESENT ILLNESS:  Travis [name]  is a 59 y.o. male with a known history of nonischemic cardiomyopathy with EF of 20-25%, history of alcohol abuse, ongoing tobacco abuse, hypertension, COPD, comes emergency room with increasing shortness of breath and chest tightness. Patient was seen in the emergency room 2 days ago was treated for possible reactive bronchitis and sent home. He received breathing treatment during his ER visit. Workup in the ER showed patient is in congestive heart failure with bilateral pulmonary edema seen on chest x-ray. He is hypoxic with sats down in the 80s. Patient received IV Lasix in the emergency room he is being admitted for acute hypoxic respiratory failure secondary to acute on chronic congestive heart failure systolic.   PAST MEDICAL HISTORY:   Past Medical History  Diagnosis Date  . Systolic heart failure     EF 20-25% by echo 12/28/10  . Nonischemic cardiomyopathy ([place])     minimal coronary disease by cath 12/29/10  . Hemoptysis     secondary to pulmonary edema  . Hyperlipidemia   . Chronic anemia   . GI bleed     15 years ago  . Tobacco abuse   . Alcohol abuse   . Osteoarthritis   . Gout   . Diabetes mellitus, type 2 ([place])     pt reports his DM is gone  . 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 Cardiac catheterization    . Hip arthroplasty Right 03/15/2013    Procedure: ARTHROPLASTY BIPOLAR HIP;  Surgeon: [name], MD;  Location: [facility];  Service: Orthopedics;  Laterality: Right;    SOCIAL HISTORY:   Social  History  Substance Use Topics  . Smoking status: Current Every Day Smoker -- 1.00 packs/day for 30 years    Types: Cigarettes  . Smokeless tobacco: Never Used  . Alcohol Use: 0.6 oz/week    1 Glasses of wine per week     Comment: occasionally    FAMILY HISTORY:   Family History  Problem Relation Age of Onset  . Diabetes Mother   . Hypertension Mother   . Diabetes Father   . Hypertension Father   . Cancer Neg Hx   . Heart disease Neg Hx   . Stroke Neg Hx     DRUG ALLERGIES:  No Known Allergies  REVIEW OF SYSTEMS:  Review of Systems  Constitutional: Negative for fever, chills and weight loss.  HENT: Negative for ear discharge, ear pain and nosebleeds.   Eyes: Negative for blurred vision, pain and discharge.  Respiratory: Positive for shortness of breath. Negative for sputum production, wheezing and stridor.   Cardiovascular: Positive for chest pain, orthopnea and PND. Negative for palpitations.  Gastrointestinal: Negative for nausea, vomiting, abdominal pain and diarrhea.  Genitourinary: Negative for urgency and frequency.  Musculoskeletal: Negative for back pain and joint pain.  Neurological: Positive for weakness. Negative for sensory change, speech change and focal weakness.  Psychiatric/Behavioral: Negative for depression and hallucinations. The patient is not nervous/anxious.   All other systems reviewed and are negative.  MEDICATIONS AT HOME:   Prior to Admission medications   Medication Sig Start Date End Date Taking? Authorizing Provider  albuterol (PROVENTIL HFA;VENTOLIN HFA) 108 (90 BASE) MCG/ACT inhaler Inhale 2 puffs into the lungs every 6 (six) hours as needed for wheezing or shortness of breath. Take 2 puffs every 5-10 minutes up to 6 puffs total over 15 minutes when needed.  Use with a spacer. 01/24/15  Yes Ahmed Prima, MD  aspirin 325 MG EC tablet Take 325 mg by mouth daily.   Yes Historical Provider, MD  atorvastatin (LIPITOR) 10 MG tablet Take 1  tablet (10 mg total) by mouth daily at 6 PM. Patient taking differently: Take 10 mg by mouth at bedtime.  05/27/14  Yes Amy D Clegg, NP  carvedilol (COREG) 3.125 MG tablet Take 1 tablet (3.125 mg total) by mouth 2 (two) times daily with a meal. 12/29/14  Yes Amy D Clegg, NP  colchicine 0.6 MG tablet TAKE 1 TABLET BY MOUTH TWICE A DAY 11/26/14  Yes [name], NP  cyclobenzaprine (FLEXERIL) 10 MG tablet Take 1 tablet (10 mg total) by mouth 3 (three) times daily as needed for muscle spasms. 12/31/13  Yes [name], NP  diphenoxylate-atropine (LOMOTIL) 2.5-0.025 MG per tablet Take 1 tablet by mouth 4 (four) times daily as needed for diarrhea or loose stools. 05/03/14  Yes [name], NP  etodolac (LODINE) 500 MG tablet Take 1 tablet (500 mg total) by mouth 2 (two) times daily. SCHEDULE FOLLOW UP OFFICE VISIT FOR MORE REFILLS [phone] Patient taking differently: Take 500 mg by mouth daily. SCHEDULE FOLLOW UP OFFICE VISIT FOR MORE REFILLS [phone] 10/27/14  Yes [name], NP  fenofibrate 54 MG tablet Take 1 tablet (54 mg total) by mouth daily. 12/28/14  Yes Amy D Clegg, NP  folic acid (FOLVITE) 1 MG tablet Take 1 tablet (1 mg total) by mouth daily. 12/28/14  Yes Amy D Clegg, NP  ibuprofen (ADVIL,MOTRIN) 800 MG tablet Take 800 mg by mouth every 8 (eight) hours as needed for fever, headache, mild pain, moderate pain or cramping.    Yes Historical Provider, MD  indomethacin (INDOCIN) 50 MG capsule TAKE 1 CAPSULE BY MOUTH 3 TIMES A DAY WITH FOOD Patient taking differently: TAKE 1 CAPSULE BY MOUTH DAILY 11/26/14  Yes [name], NP  lisinopril (PRINIVIL,ZESTRIL) 5 MG tablet Take 1 tablet (5 mg total) by mouth daily. SCHEDULE FOLLOW UP OFFICE VISIT FOR MORE REFILLS [phone] 10/27/14  Yes [name], NP  Multiple Vitamin (ONE-A-DAY MENS PO) Take 1 tablet by mouth daily.    Yes Historical Provider, MD  Omega-3 Fatty Acids (FISH OIL PO) Take 1 capsule by mouth daily.   Yes Historical Provider,  MD  ondansetron (ZOFRAN ODT) 4 MG disintegrating tablet Take 1 tablet (4 mg total) by mouth every 8 (eight) hours as needed for nausea or vomiting. 05/03/14  Yes [name], NP  thiamine 100 MG tablet Take 1 tablet (100 mg total) by mouth daily. 09/16/13  Yes Thurnell Lose, MD      VITAL SIGNS:  Blood pressure 155/110, pulse 115, temperature 98 F (36.7 C), temperature source Oral, resp. rate 25, height 6\' 1"  (1.854 m), weight 77.111 kg (170 lb), SpO2 95 %.  PHYSICAL EXAMINATION:  GENERAL:  59 y.o.-year-old patient lying in the bed with no acute distress.  EYES: Pupils equal, round, reactive to light and accommodation. No scleral icterus. Extraocular muscles intact.  HEENT: Head atraumatic, normocephalic.  Oropharynx and nasopharynx clear.  NECK:  Supple, no jugular venous distention. No thyroid enlargement, no tenderness.JVD plus  LUNGS:Distant sounds bilaterally, no wheezing, rales,rhonchi or crepitation. No use of accessory muscles of respiration.  CARDIOVASCULAR: S1, S2 normal. No murmurs, rubs, or gallops.  ABDOMEN: Soft, nontender, nondistended. Bowel sounds present. No organomegaly or mass.  EXTREMITIES: No pedal edema, cyanosis, or clubbing.  NEUROLOGIC: Cranial nerves II through XII are intact. Muscle strength 5/5 in all extremities. Sensation intact. Gait not checked.  PSYCHIATRIC: The patient is alert and oriented x 3.  SKIN: No obvious rash, lesion, or ulcer.   LABORATORY PANEL:   CBC  Recent Labs Lab 01/26/15 1020  WBC 5.9  HGB 12.7*  HCT 38.8*  PLT 207   ------------------------------------------------------------------------------------------------------------------  Chemistries   Recent Labs Lab 01/26/15 1020  NA 144  K 4.0  CL 110  CO2 26  GLUCOSE 110*  BUN 16  CREATININE 1.25*  CALCIUM 9.3  AST 24  ALT 17  ALKPHOS 56  BILITOT 0.6    ------------------------------------------------------------------------------------------------------------------  Cardiac Enzymes  Recent Labs Lab 01/26/15 1020  TROPONINI <0.03   ------------------------------------------------------------------------------------------------------------------  RADIOLOGY:  Dg Chest 2 View  01/26/2015  CLINICAL DATA:  Shortness of breath.  Chest and back pain. EXAM: CHEST  2 VIEW COMPARISON:  01/24/2015 FINDINGS: Since the prior study the patient has developed diffuse interstitial pulmonary edema with Kerley B-lines at the bases. The heart size and pulmonary vascularity are normal. No effusions. IMPRESSION: The patient has developed extensive interstitial pulmonary edema since the study performed on 01/24/2015. Electronically Signed   By: Lorriane Shire M.D.   On: 01/26/2015 10:55    EKG:  Sinus tachycardia, LVH,  biatrial enlargement  IMPRESSION AND PLAN:  59 year old Mr. Guinan with past medical history of nonischemic cardiac myopathy with EF of 20-25% in 2012, E echo in 2015 showed EF of 45-50% with history of ongoing tobacco abuse and occasional alcohol use hypertension comes to the emergency room with increasing chest tightness and shortness of breath. He was found to be in  1. Acute hypoxic respiratory failure secondary to acute on chronic congestive heart failure systolic. Chest x-ray showed bilateral pulmonary edema/pulmonary vascular congestion Sats dropped into 85-89. On room air. Admit to telemetry floor. Low salt diet. Cycle cardiac enzymes 3. Cardiology consultation with Dr. Rockey Situ. IV Lasix 20 mg 3 times a day monitor I's and O's Wean oxygen as tolerated. Change to by mouth Lasix when appropriate. I will not repeat echo at this time. His last echo was done in 2015. We'll continue Coreg and lisinopril  2. Ongoing tobacco abuse. Patient counseled on smoking cessation about 4 minutes spent. Patient appears motivated.   3. History of  COPD with ongoing tobacco abuse continue when necessary nebulizer and inhaler inhalers.  4. Hyperlipidemia on fenofibrate  5. High blood pressure continue Coreg and lisinopril   All the records are reviewed and case discussed with ED provider. Management plans discussed with the patient, family and they are in agreement.  CODE STATUS: Full  TOTAL TIME TAKING CARE OF THIS PATIENT 50  minutes.    Sudiksha Victor M.D on 01/26/2015 at 1:29 PM  Between 7am to 6pm - Pager - [phone]  After 6pm go to [email or web address] - password [email or web address] [facility]  [facility]  Office  [phone]  CC: Primary care physician; [name], NP

## 2015-01-27 ENCOUNTER — Inpatient Hospital Stay (HOSPITAL_COMMUNITY): Payer: Medicare Other

## 2015-01-27 DIAGNOSIS — R079 Chest pain, unspecified: Secondary | ICD-10-CM

## 2015-01-27 LAB — NM MYOCAR MULTI W/SPECT W/WALL MOTION / EF
CHL CUP NUCLEAR SDS: 0
CHL CUP NUCLEAR SRS: 21
LV sys vol: 127 mL
LVDIAVOL: 199 mL
SSS: 21
TID: 0.95

## 2015-01-27 LAB — BASIC METABOLIC PANEL
ANION GAP: 9 (ref 5–15)
BUN: 25 mg/dL — ABNORMAL HIGH (ref 6–20)
CALCIUM: 9.2 mg/dL (ref 8.9–10.3)
CO2: 26 mmol/L (ref 22–32)
Chloride: 104 mmol/L (ref 101–111)
Creatinine, Ser: 1.35 mg/dL — ABNORMAL HIGH (ref 0.61–1.24)
GFR, EST NON AFRICAN AMERICAN: 56 mL/min — AB (ref >60)
Glucose, Bld: 170 mg/dL — ABNORMAL HIGH (ref 65–99)
POTASSIUM: 3.8 mmol/L (ref 3.5–5.1)
Sodium: 139 mmol/L (ref 135–145)

## 2015-01-27 LAB — T4, FREE: Free T4: 0.97 ng/dL (ref 0.61–1.12)

## 2015-01-27 LAB — TROPONIN I: Troponin I: <0.03 ng/mL (ref <0.031)

## 2015-01-27 MED ORDER — SODIUM CHLORIDE 0.9 % IJ SOLN
3.0000 mL | INTRAMUSCULAR | Status: DC | PRN
Start: 2015-01-27 — End: 2015-01-28

## 2015-01-27 MED ORDER — SODIUM CHLORIDE 0.9 % IV SOLN
INTRAVENOUS | Status: DC
  Administered 2015-01-28: 06:00:00 via INTRAVENOUS

## 2015-01-27 MED ORDER — TECHNETIUM TC 99M SESTAMIBI - CARDIOLITE
30.2300 | Freq: Once | INTRAVENOUS | Status: AC | PRN
  Administered 2015-01-27: 30.23 via INTRAVENOUS

## 2015-01-27 MED ORDER — TECHNETIUM TC 99M SESTAMIBI - CARDIOLITE
12.9300 | Freq: Once | INTRAVENOUS | Status: AC | PRN
  Administered 2015-01-27: 12.93 via INTRAVENOUS

## 2015-01-27 MED ORDER — SODIUM CHLORIDE 0.9 % IV SOLN: 250.0000 mL | INTRAVENOUS | Status: DC | PRN

## 2015-01-27 MED ORDER — FUROSEMIDE 40 MG PO TABS
40.0000 mg | ORAL_TABLET | Freq: Two times a day (BID) | ORAL | Status: DC
  Administered 2015-01-27: 40 mg via ORAL
  Filled 2015-01-27: qty 1

## 2015-01-27 MED ORDER — SODIUM CHLORIDE 0.9 % IJ SOLN
3.0000 mL | Freq: Two times a day (BID) | INTRAMUSCULAR | Status: DC
  Administered 2015-01-27: 3 mL via INTRAVENOUS

## 2015-01-27 MED ORDER — INFLUENZA VAC SPLIT QUAD 0.5 ML IM SUSY
0.5000 mL | PREFILLED_SYRINGE | INTRAMUSCULAR | Status: AC
  Administered 2015-01-28: 0.5 mL via INTRAMUSCULAR
  Filled 2015-01-27: qty 0.5

## 2015-01-27 MED ORDER — REGADENOSON 0.4 MG/5ML IV SOLN
0.4000 mg | Freq: Once | INTRAVENOUS | Status: AC
  Administered 2015-01-27: 0.4 mg via INTRAVENOUS

## 2015-01-27 NOTE — Progress Notes (Signed)
[facility] [place] NAME: Travis [name]    MR#:  [id number]  DATE OF BIRTH:  09-Nov-1955  SUBJECTIVE:  CHIEF COMPLAINT:   Chief Complaint  Patient presents with  . Shortness of Breath   feels okay, family at bedside  REVIEW OF SYSTEMS:  Review of Systems  Constitutional: Negative for fever, weight loss, malaise/fatigue and diaphoresis.  HENT: Negative for ear discharge, ear pain, hearing loss, nosebleeds, sore throat and tinnitus.   Eyes: Negative for blurred vision and pain.  Respiratory: Positive for shortness of breath. Negative for cough, hemoptysis and wheezing.   Cardiovascular: Negative for chest pain, palpitations, orthopnea and leg swelling.  Gastrointestinal: Negative for heartburn, nausea, vomiting, abdominal pain, diarrhea, constipation and blood in stool.  Genitourinary: Negative for dysuria, urgency and frequency.  Musculoskeletal: Negative for myalgias and back pain.  Skin: Negative for itching and rash.  Neurological: Negative for dizziness, tingling, tremors, focal weakness, seizures, weakness and headaches.  Psychiatric/Behavioral: Negative for depression. The patient is not nervous/anxious.     DRUG ALLERGIES:  No Known Allergies VITALS:  Blood pressure 130/82, pulse 96, temperature 98.9 F (37.2 C), temperature source Oral, resp. rate 18, height 6\' 4"  (1.93 m), weight 79.062 kg (174 lb 4.8 oz), SpO2 98 %. PHYSICAL EXAMINATION:  Physical Exam  Constitutional: He is oriented to person, place, and time and well-developed, well-nourished, and in no distress.  HENT:  Head: Normocephalic and atraumatic.  Eyes: Conjunctivae and EOM are normal. Pupils are equal, round, and reactive to light.  Neck: Normal range of motion. Neck supple. No tracheal deviation present. No thyromegaly present.  Cardiovascular: Normal rate, regular rhythm and normal heart sounds.   Pulmonary/Chest: Effort normal and breath sounds normal.  No respiratory distress. He has no wheezes. He exhibits no tenderness.  Abdominal: Soft. Bowel sounds are normal. He exhibits no distension. There is no tenderness.  Musculoskeletal: Normal range of motion.  Neurological: He is alert and oriented to person, place, and time. No cranial nerve deficit.  Skin: Skin is warm and dry. No rash noted.  Psychiatric: Mood and affect normal.   LABORATORY PANEL:   CBC  Recent Labs Lab 01/26/15 1452  WBC 6.3  HGB 14.2  HCT 41.5  PLT 233   ------------------------------------------------------------------------------------------------------------------ Chemistries   Recent Labs Lab 01/26/15 1020 01/26/15 1452 01/27/15 0417  NA 144  --  139  K 4.0  --  3.8  CL 110  --  104  CO2 26  --  26  GLUCOSE 110*  --  170*  BUN 16  --  25*  CREATININE 1.25* 1.22 1.35*  CALCIUM 9.3  --  9.2  MG  --  1.8  --   AST 24  --   --   ALT 17  --   --   ALKPHOS 56  --   --   BILITOT 0.6  --   --    RADIOLOGY:  Nm Myocar Multi W/spect W/wall Motion / Ef  01/27/2015   Defect 1: There is a medium defect of moderate severity present in the mid anterior, mid anterolateral, apical anterior and apex location. The defect is partially reversible. This could be due to an artifact. However, previous infarct with moderate peri-infarct ischemia cannot be excluded.  ST segment depression was noted during stress. This was nondiagnostic due to abnormal baseline.  This is a high risk study mainly due to severely reduced ejection fraction  Nuclear stress EF: 28%.  this study is overall very suboptimal due to intense GI activity interfering with the inferior wall.  correlate clinically.    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   1. Acute hypoxic respiratory failure secondary to acute on chronic congestive heart failure systolic. Repeat echo showing EF of 25%, which is a big drop from his previous echo, getting Myoview today and if abnormal will likely need further evaluation including cardiac catheter as per cardiology. Continue IV Lasix  2.  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 History of COPD with ongoing tobacco abuse continue when necessary nebulizer and inhaler inhalers.  4. Hyperlipidemia on fenofibrate  5. High blood pressure continue Coreg and lisinopril    All the records are reviewed and case discussed with Care Management/Social Worker. Management plans discussed with the patient, family and they are in agreement.  CODE STATUS: Full Code  TOTAL TIME TAKING CARE OF THIS PATIENT: 35 minutes.   More than 50% of the time was spent in counseling/coordination of care: YES  POSSIBLE D/C IN 1-2 DAYS, DEPENDING ON CLINICAL CONDITION and Cardiology evaluation   [facility], Terance Pomplun M.D on 01/27/2015 at 5:05 PM  Between 7am to 6pm - Pager - [phone]  After 6pm go to [email or web address] - password [email or web address] [facility]  [facility]  Office  [phone]  CC:  Primary care physician; [name], NP

## 2015-01-27 NOTE — Progress Notes (Signed)
Initial appointment was made at the [facility] on February 16, 2015 at 11:30am. Thank you.

## 2015-01-27 NOTE — Progress Notes (Signed)
Awaiting cardiology input about myoview and further recs.  Patient alert and oriented, VSS.  Some pain after myoview, namely a headache.

## 2015-01-27 NOTE — Care Management (Signed)
Patient present with shortness of breath and hypoxia.  He has cardiomyopathy with EF of approx 20%. Admitted with exacerbation of chf.  he does not have chronic home 02.  Discussed during progression to check/assess for the need of home 02.  It is verbally reported to CM that Myoview was positive and anticipate cardiac cath

## 2015-01-27 NOTE — Progress Notes (Signed)
SUBJECTIVE:  The patient had chest pain after the stress test which has resolved since then. She reports improvement in dyspnea. He denies excessive alcohol use.   Filed Vitals:   01/26/15 2112 01/27/15 0416 01/27/15 1105 01/27/15 1118  BP: 145/93 124/79 134/95 130/82  Pulse: 102 99 94 96  Temp: 98 F (36.7 C) 98 F (36.7 C) 98.9 F (37.2 C)   TempSrc: Oral Oral Oral   Resp: 18 18    Height:      Weight:      SpO2: 94% 98% 98%     Intake/Output Summary (Last 24 hours) at 01/27/15 1726 Last data filed at 01/27/15 1720  Gross per 24 hour  Intake    600 ml  Output   1950 ml  Net  -1350 ml    LABS: Basic Metabolic Panel:  Recent Labs  01/26/15 1020 01/26/15 1452 01/27/15 0417  NA 144  --  139  K 4.0  --  3.8  CL 110  --  104  CO2 26  --  26  GLUCOSE 110*  --  170*  BUN 16  --  25*  CREATININE 1.25* 1.22 1.35*  CALCIUM 9.3  --  9.2  MG  --  1.8  --    Liver Function Tests:  Recent Labs  01/26/15 1020  AST 24  ALT 17  ALKPHOS 56  BILITOT 0.6  PROT 6.6  ALBUMIN 3.7   No results for input(s): LIPASE, AMYLASE in the last 72 hours. CBC:  Recent Labs  01/26/15 1020 01/26/15 1452  WBC 5.9 6.3  NEUTROABS 3.1  --   HGB 12.7* 14.2  HCT 38.8* 41.5  MCV 95.0 93.5  PLT 207 233   Cardiac Enzymes:  Recent Labs  01/26/15 1452 01/26/15 2027 01/27/15 0207  TROPONINI <0.03 <0.03 <0.03   BNP: Invalid input(s): POCBNP D-Dimer: No results for input(s): DDIMER in the last 72 hours. Hemoglobin A1C: No results for input(s): HGBA1C in the last 72 hours. Fasting Lipid Panel: No results for input(s): CHOL, HDL, LDLCALC, TRIG, CHOLHDL, LDLDIRECT in the last 72 hours. Thyroid Function Tests:  Recent Labs  01/26/15 2027  TSH 0.224*   Anemia Panel: No results for input(s): VITAMINB12, FOLATE, FERRITIN, TIBC, IRON, RETICCTPCT in the last 72 hours.   PHYSICAL EXAM General: Well developed, well nourished, in no acute distress HEENT:  Normocephalic and  atramatic Neck:  No JVD.  Lungs: Clear bilaterally to auscultation and percussion. Heart: HRRR . Normal S1 and S2 without gallops or murmurs.  Abdomen: Bowel sounds are positive, abdomen soft and non-tender  Msk:  Back normal, normal gait. Normal strength and tone for age. Extremities: No clubbing, cyanosis or edema.   Neuro: Alert and oriented X 3. Psych:  Good affect, responds appropriately  TELEMETRY: Reviewed telemetry pt in normal sinus rhythm  ASSESSMENT AND PLAN:  1. Acute on chronic systolic CHF/ETOH cardiomyopathy: -Chest xray with bilateral pulmonary edema, BNP 1400 -Monitor SCr with diuresis -Replete K+ to goal of 4.0 with diuresis  -Add Imdur 30 mg daily -Continue Coreg 3.125 mg bid, look to titrate up as he improves. Heart rate should improve as he diureses  -Continue lisinopril  Echocardiogram showed a drop in LV systolic function with an ejection fraction of 25-30%. Pharmacologic nuclear stress test showed an EF of 28% and possible LAD infarct with peri-infarct ischemia. However, the study was very suboptimal. Given the stress test findings and then drop in ejection fraction, I think we have to exclude  progression of underlying coronary artery disease. Previous cardiac catheterization in 2012 showed mild LAD disease. I recommend proceeding with cardiac catheterization tomorrow. I discussed risks and benefits.    2. . Chest pain: -Troponin negative thus far cardiac cath as outlined above.  4. HTN: -Imdur added as above -Titrate Coreg as his condition improves -Continue lisinopril   5. History of medication noncompliance:  6. ETOH abuse: -he denies excessive use recently.  7. Tobacco abuse: -Cessation advised    Kathlyn Sacramento, MD, [facility] 01/27/2015 5:26 PM

## 2015-01-28 ENCOUNTER — Other Ambulatory Visit: Payer: Self-pay | Admitting: Internal Medicine

## 2015-01-28 ENCOUNTER — Encounter
Admission: EM | Disposition: A | Discharge status: Home / Self Care | Payer: Self-pay | Source: Home / Self Care | Attending: Internal Medicine

## 2015-01-28 ENCOUNTER — Encounter: Payer: Self-pay | Admitting: Cardiovascular Disease

## 2015-01-28 DIAGNOSIS — I2510 Atherosclerotic heart disease of native coronary artery without angina pectoris: Secondary | ICD-10-CM

## 2015-01-28 DIAGNOSIS — I110 Hypertensive heart disease with heart failure: Secondary | ICD-10-CM | POA: Diagnosis not present

## 2015-01-28 DIAGNOSIS — R079 Chest pain, unspecified: Secondary | ICD-10-CM | POA: Insufficient documentation

## 2015-01-28 DIAGNOSIS — I5023 Acute on chronic systolic (congestive) heart failure: Secondary | ICD-10-CM

## 2015-01-28 HISTORY — PX: CARDIAC CATHETERIZATION: SHX172

## 2015-01-28 LAB — CBC
HCT: 40.3 % (ref 40.0–52.0)
HEMOGLOBIN: 13 g/dL (ref 13.0–18.0)
MCH: 30.8 pg (ref 26.0–34.0)
MCHC: 32.4 g/dL (ref 32.0–36.0)
MCV: 95.2 fL (ref 80.0–100.0)
Platelets: 221 10*3/uL (ref 150–440)
RBC: 4.23 MIL/uL — AB (ref 4.40–5.90)
RDW: 15.1 % — ABNORMAL HIGH (ref 11.5–14.5)
WBC: 9.1 10*3/uL (ref 3.8–10.6)

## 2015-01-28 LAB — BASIC METABOLIC PANEL
Anion gap: 7 (ref 5–15)
BUN: 29 mg/dL — ABNORMAL HIGH (ref 6–20)
CHLORIDE: 102 mmol/L (ref 101–111)
CO2: 31 mmol/L (ref 22–32)
CREATININE: 1.36 mg/dL — AB (ref 0.61–1.24)
Calcium: 9.3 mg/dL (ref 8.9–10.3)
GFR, EST NON AFRICAN AMERICAN: 55 mL/min — AB (ref >60)
Glucose, Bld: 115 mg/dL — ABNORMAL HIGH (ref 65–99)
Potassium: 3.7 mmol/L (ref 3.5–5.1)
SODIUM: 140 mmol/L (ref 135–145)

## 2015-01-28 LAB — T3, FREE: T3 FREE: 2.6 pg/mL (ref 2.0–4.4)

## 2015-01-28 SURGERY — LEFT HEART CATH AND CORONARY ANGIOGRAPHY
Anesthesia: Moderate Sedation

## 2015-01-28 MED ORDER — LISINOPRIL 10 MG PO TABS
10.0000 mg | ORAL_TABLET | Freq: Every day | ORAL | Status: DC
  Administered 2015-01-28: 10 mg via ORAL
  Filled 2015-01-28: qty 1

## 2015-01-28 MED ORDER — FENTANYL CITRATE (PF) 100 MCG/2ML IJ SOLN
INTRAMUSCULAR | Status: DC | PRN
  Administered 2015-01-28: 50 ug via INTRAVENOUS
  Administered 2015-01-28: 25 ug via INTRAVENOUS

## 2015-01-28 MED ORDER — FENTANYL CITRATE (PF) 100 MCG/2ML IJ SOLN
INTRAMUSCULAR | Status: AC
  Filled 2015-01-28: qty 2

## 2015-01-28 MED ORDER — SODIUM CHLORIDE 0.9 % IJ SOLN: 3.0000 mL | Freq: Two times a day (BID) | INTRAMUSCULAR | Status: DC

## 2015-01-28 MED ORDER — HEPARIN SODIUM (PORCINE) 1000 UNIT/ML IJ SOLN
INTRAMUSCULAR | Status: DC | PRN
  Administered 2015-01-28: 4000 [IU] via INTRAVENOUS

## 2015-01-28 MED ORDER — SODIUM CHLORIDE 0.9 % IV SOLN: 250.0000 mL | INTRAVENOUS | Status: DC | PRN

## 2015-01-28 MED ORDER — HEPARIN (PORCINE) IN NACL 2-0.9 UNIT/ML-% IJ SOLN
INTRAMUSCULAR | Status: AC
  Filled 2015-01-28: qty 1000

## 2015-01-28 MED ORDER — VERAPAMIL HCL 2.5 MG/ML IV SOLN
INTRAVENOUS | Status: AC
  Filled 2015-01-28: qty 2

## 2015-01-28 MED ORDER — SODIUM CHLORIDE 0.9 % IJ SOLN
3.0000 mL | INTRAMUSCULAR | Status: DC | PRN
Start: 2015-01-28 — End: 2015-01-28

## 2015-01-28 MED ORDER — FUROSEMIDE 40 MG PO TABS
40.0000 mg | ORAL_TABLET | Freq: Every day | ORAL | Status: DC
  Administered 2015-01-28: 40 mg via ORAL
  Filled 2015-01-28: qty 1

## 2015-01-28 MED ORDER — VERAPAMIL HCL 2.5 MG/ML IV SOLN
INTRAVENOUS | Status: DC | PRN
  Administered 2015-01-28: 2.5 mg via INTRA_ARTERIAL

## 2015-01-28 MED ORDER — MIDAZOLAM HCL 2 MG/2ML IJ SOLN
INTRAMUSCULAR | Status: AC
  Filled 2015-01-28: qty 2

## 2015-01-28 MED ORDER — MIDAZOLAM HCL 2 MG/2ML IJ SOLN
INTRAMUSCULAR | Status: DC | PRN
  Administered 2015-01-28: 1 mg via INTRAVENOUS

## 2015-01-28 MED ORDER — HEPARIN SODIUM (PORCINE) 1000 UNIT/ML IJ SOLN
INTRAMUSCULAR | Status: AC
  Filled 2015-01-28: qty 1

## 2015-01-28 MED ORDER — ASPIRIN EC 81 MG PO TBEC: 81.0000 mg | DELAYED_RELEASE_TABLET | Freq: Every day | ORAL | Status: DC

## 2015-01-28 MED ORDER — IOHEXOL 300 MG/ML  SOLN
INTRAMUSCULAR | Status: DC | PRN
  Administered 2015-01-28: 40 mL via INTRAVENOUS

## 2015-01-28 MED ORDER — FUROSEMIDE 40 MG PO TABS: 40.0000 mg | ORAL_TABLET | Freq: Every day | ORAL | Status: DC

## 2015-01-28 MED ORDER — LISINOPRIL 10 MG PO TABS: 10.0000 mg | ORAL_TABLET | Freq: Every day | ORAL | Status: DC

## 2015-01-28 MED ORDER — FENTANYL CITRATE (PF) 100 MCG/2ML IJ SOLN
INTRAMUSCULAR | Status: DC
Start: 2015-01-28 — End: 2015-01-28
  Filled 2015-01-28: qty 2

## 2015-01-28 SURGICAL SUPPLY — 6 items
CATH OPTITORQUE JACKY 4.0 5F (CATHETERS) ×2 IMPLANT
DEVICE RAD TR BAND REGULAR (VASCULAR PRODUCTS) ×1 IMPLANT
GLIDESHEATH SLEND SS 6F .021 (SHEATH) ×2 IMPLANT
KIT MANI 3VAL PERCEP (MISCELLANEOUS) ×2 IMPLANT
PACK CARDIAC CATH (CUSTOM PROCEDURE TRAY) ×2 IMPLANT
WIRE SAFE-T 1.5MM-J .035X260CM (WIRE) ×2 IMPLANT

## 2015-01-28 NOTE — Progress Notes (Signed)
SUBJECTIVE:  The patient feels better. Cardiac cath showed no obstructive CAD.   Filed Vitals:   01/27/15 1118 01/27/15 1938 01/28/15 0350 01/28/15 0739  BP: 130/82 113/75 117/80 121/90  Pulse: 96 95 85 89  Temp:  98.6 F (37 C) 97.6 F (36.4 C) 98.2 F (36.8 C)  TempSrc:  Oral Oral   Resp:  18 17 18   Height:      Weight:      SpO2:  92% 97% 98%    Intake/Output Summary (Last 24 hours) at 01/28/15 0833 Last data filed at 01/28/15 [id number]  Gross per 24 hour  Intake    243 ml  Output   2100 ml  Net  -1857 ml    LABS: Basic Metabolic Panel:  Recent Labs  01/26/15 1452 01/27/15 0417 01/28/15 0527  NA  --  139 140  K  --  3.8 3.7  CL  --  104 102  CO2  --  26 31  GLUCOSE  --  170* 115*  BUN  --  25* 29*  CREATININE 1.22 1.35* 1.36*  CALCIUM  --  9.2 9.3  MG 1.8  --   --    Liver Function Tests:  Recent Labs  01/26/15 1020  AST 24  ALT 17  ALKPHOS 56  BILITOT 0.6  PROT 6.6  ALBUMIN 3.7   No results for input(s): LIPASE, AMYLASE in the last 72 hours. CBC:  Recent Labs  01/26/15 1020 01/26/15 1452 01/28/15 0527  WBC 5.9 6.3 9.1  NEUTROABS 3.1  --   --   HGB 12.7* 14.2 13.0  HCT 38.8* 41.5 40.3  MCV 95.0 93.5 95.2  PLT 207 233 221   Cardiac Enzymes:  Recent Labs  01/26/15 1452 01/26/15 2027 01/27/15 0207  TROPONINI <0.03 <0.03 <0.03   BNP: Invalid input(s): POCBNP D-Dimer: No results for input(s): DDIMER in the last 72 hours. Hemoglobin A1C: No results for input(s): HGBA1C in the last 72 hours. Fasting Lipid Panel: No results for input(s): CHOL, HDL, LDLCALC, TRIG, CHOLHDL, LDLDIRECT in the last 72 hours. Thyroid Function Tests:  Recent Labs  01/26/15 2027 01/27/15 1523  TSH 0.224*  --   T3FREE  --  2.6   Anemia Panel: No results for input(s): VITAMINB12, FOLATE, FERRITIN, TIBC, IRON, RETICCTPCT in the last 72 hours.   PHYSICAL EXAM General: Well developed, well nourished, in no acute distress HEENT:  Normocephalic and  atramatic Neck:  No JVD.  Lungs: Clear bilaterally to auscultation and percussion. Heart: HRRR . Normal S1 and S2 without gallops or murmurs.  Abdomen: Bowel sounds are positive, abdomen soft and non-tender  Msk:  Back normal, normal gait. Normal strength and tone for age. Extremities: No clubbing, cyanosis or edema.   Neuro: Alert and oriented X 3. Psych:  Good affect, responds appropriately  TELEMETRY: Reviewed telemetry pt in normal sinus rhythm  ASSESSMENT AND PLAN:  1. Acute on chronic systolic CHF/ETOH cardiomyopathy: -Chest xray with bilateral pulmonary edema, BNP 1400 - Echocardiogram showed a drop in LV systolic function with an ejection fraction of 25-30%. Pharmacologic nuclear stress test showed an EF of 28% and possible LAD infarct with peri-infarct ischemia. However, the study was very suboptimal. - cardiac cath showed mild 1 vessel CAD which does not seem to be different from most recent cath.  - Continue Coreg 3.125 mg bid, I increased Lisinopril to 10 mg daily and changed Lasix 40 mg po daily.  - Strongly advised him to quit ETOH completely.  -  Can discharge home from a cardiac standpoint.  - Follow up with Dr. Rockey Situ in few weeks.   2. . Chest pain: -Troponin negative thus far cardiac cath showed no obstructive disease.   4. HTN: - continue Coreg and Lisinopril.   5. History of medication noncompliance:  6. ETOH abuse: -he denies excessive use recently.  7. Tobacco abuse: -Cessation advised    Kathlyn Sacramento, MD, [facility] 01/28/2015 8:33 AM

## 2015-01-28 NOTE — Progress Notes (Signed)
Patient received discharge instructions, pt verbalized understanding. IV was removed with no signs of infection. Dressing clean, dry intact. No skin tears or wounds present. Prescription was sent to pharmacy of choice. Patient was escorted out with staff member via wheelchair via private auto. No further needs from care management team.  

## 2015-01-28 NOTE — Interval H&P Note (Signed)
History and Physical Interval Note:  01/28/2015 7:52 AM  Travis [name]  has presented today for surgery, with the diagnosis of CHF  The various methods of treatment have been discussed with the patient and family. After consideration of risks, benefits and other options for treatment, the patient has consented to  Procedure(s): Left Heart Cath and Coronary Angiography (N/A) as a surgical intervention .  The patient's history has been reviewed, patient examined, no change in status, stable for surgery.  I have reviewed the patient's chart and labs.  Questions were answered to the patient's satisfaction.     Kathlyn Sacramento

## 2015-01-28 NOTE — Progress Notes (Signed)
Patient's O2 sats remained above 94% on room air while ambulating in the hallway. Patient had no complaints of shortness of breath.

## 2015-01-28 NOTE — Discharge Instructions (Signed)
Heart Failure Clinic appointment on February 16, 2015 at 11:30am with Darylene Price, [place]. Please call [phone] to reschedule.   Heart Failure Heart failure means your heart has trouble pumping blood. This makes it hard for your body to work well. Heart failure is usually a long-term (chronic) condition. You must take good care of yourself and follow your doctor's treatment plan. HOME CARE  Take your heart medicine as told by your doctor.  Do not stop taking medicine unless your doctor tells you to.  Do not skip any dose of medicine.  Refill your medicines before they run out.  Take other medicines only as told by your doctor or pharmacist.  Stay active if told by your doctor. The elderly and people with severe heart failure should talk with a doctor about physical activity.  Eat heart-healthy foods. Choose foods that are without trans fat and are low in saturated fat, cholesterol, and salt (sodium). This includes fresh or frozen fruits and vegetables, fish, lean meats, fat-free or low-fat dairy foods, whole grains, and high-fiber foods. Lentils and dried peas and beans (legumes) are also good choices.  Limit salt if told by your doctor.  Cook in a healthy way. Roast, grill, broil, bake, poach, steam, or stir-fry foods.  Limit fluids as told by your doctor.  Weigh yourself every morning. Do this after you pee (urinate) and before you eat breakfast. Write down your weight to give to your doctor.  Take your blood pressure and write it down if your doctor tells you to.  Ask your doctor how to check your pulse. Check your pulse as told.  Lose weight if told by your doctor.  Stop smoking or chewing tobacco. Do not use gum or patches that help you quit without your doctor's approval.  Schedule and go to doctor visits as told.  Nonpregnant women should have no more than 1 drink a day. Men should have no more than 2 drinks a day. Talk to your doctor about drinking alcohol.  Stop  illegal drug use.  Stay current with shots (immunizations).  Manage your health conditions as told by your doctor.  Learn to manage your stress.  Rest when you are tired.  If it is really hot outside:  Avoid intense activities.  Use air conditioning or fans, or get in a cooler place.  Avoid caffeine and alcohol.  Wear loose-fitting, lightweight, and light-colored clothing.  If it is really cold outside:  Avoid intense activities.  Layer your clothing.  Wear mittens or gloves, a hat, and a scarf when going outside.  Avoid alcohol.  Learn about heart failure and get support as needed.  Get help to maintain or improve your quality of life and your ability to care for yourself as needed. GET HELP IF:   You gain weight quickly.  You are more short of breath than usual.  You cannot do your normal activities.  You tire easily.  You cough more than normal, especially with activity.  You have any or more puffiness (swelling) in areas such as your hands, feet, ankles, or belly (abdomen).  You cannot sleep because it is hard to breathe.  You feel like your heart is beating fast (palpitations).  You get dizzy or light-headed when you stand up. GET HELP RIGHT AWAY IF:   You have trouble breathing.  There is a change in mental status, such as becoming less alert or not being able to focus.  You have chest pain or discomfort.  You faint.  MAKE SURE YOU:   Understand these instructions.  Will watch your condition.  Will get help right away if you are not doing well or get worse.   This information is not intended to replace advice given to you by your health care provider. Make sure you discuss any questions you have with your health care provider.   Document Released: 12/20/2007 Document Revised: 04/02/2014 Document Reviewed: 04/28/2012 Elsevier Interactive Patient Education [employer].

## 2015-01-28 NOTE — Care Management Important Message (Signed)
Important Message  Patient Details  Name: Travis [name] MRN: [id number] Date of Birth: [birth year]   Medicare Important Message Given:  Yes-second notification given    Katrina Stack, RN 01/28/2015, 9:22 AM

## 2015-01-28 NOTE — Care Management (Signed)
Attending indicates there are no discharge needs.

## 2015-01-28 NOTE — H&P (View-Only) (Signed)
SUBJECTIVE:  The patient had chest pain after the stress test which has resolved since then. She reports improvement in dyspnea. He denies excessive alcohol use.   Filed Vitals:   01/26/15 2112 01/27/15 0416 01/27/15 1105 01/27/15 1118  BP: 145/93 124/79 134/95 130/82  Pulse: 102 99 94 96  Temp: 98 F (36.7 C) 98 F (36.7 C) 98.9 F (37.2 C)   TempSrc: Oral Oral Oral   Resp: 18 18    Height:      Weight:      SpO2: 94% 98% 98%     Intake/Output Summary (Last 24 hours) at 01/27/15 1726 Last data filed at 01/27/15 1720  Gross per 24 hour  Intake    600 ml  Output   1950 ml  Net  -1350 ml    LABS: Basic Metabolic Panel:  Recent Labs  01/26/15 1020 01/26/15 1452 01/27/15 0417  NA 144  --  139  K 4.0  --  3.8  CL 110  --  104  CO2 26  --  26  GLUCOSE 110*  --  170*  BUN 16  --  25*  CREATININE 1.25* 1.22 1.35*  CALCIUM 9.3  --  9.2  MG  --  1.8  --    Liver Function Tests:  Recent Labs  01/26/15 1020  AST 24  ALT 17  ALKPHOS 56  BILITOT 0.6  PROT 6.6  ALBUMIN 3.7   No results for input(s): LIPASE, AMYLASE in the last 72 hours. CBC:  Recent Labs  01/26/15 1020 01/26/15 1452  WBC 5.9 6.3  NEUTROABS 3.1  --   HGB 12.7* 14.2  HCT 38.8* 41.5  MCV 95.0 93.5  PLT 207 233   Cardiac Enzymes:  Recent Labs  01/26/15 1452 01/26/15 2027 01/27/15 0207  TROPONINI <0.03 <0.03 <0.03   BNP: Invalid input(s): POCBNP D-Dimer: No results for input(s): DDIMER in the last 72 hours. Hemoglobin A1C: No results for input(s): HGBA1C in the last 72 hours. Fasting Lipid Panel: No results for input(s): CHOL, HDL, LDLCALC, TRIG, CHOLHDL, LDLDIRECT in the last 72 hours. Thyroid Function Tests:  Recent Labs  01/26/15 2027  TSH 0.224*   Anemia Panel: No results for input(s): VITAMINB12, FOLATE, FERRITIN, TIBC, IRON, RETICCTPCT in the last 72 hours.   PHYSICAL EXAM General: Well developed, well nourished, in no acute distress HEENT:  Normocephalic and  atramatic Neck:  No JVD.  Lungs: Clear bilaterally to auscultation and percussion. Heart: HRRR . Normal S1 and S2 without gallops or murmurs.  Abdomen: Bowel sounds are positive, abdomen soft and non-tender  Msk:  Back normal, normal gait. Normal strength and tone for age. Extremities: No clubbing, cyanosis or edema.   Neuro: Alert and oriented X 3. Psych:  Good affect, responds appropriately  TELEMETRY: Reviewed telemetry pt in normal sinus rhythm  ASSESSMENT AND PLAN:  1. Acute on chronic systolic CHF/ETOH cardiomyopathy: -Chest xray with bilateral pulmonary edema, BNP 1400 -Monitor SCr with diuresis -Replete K+ to goal of 4.0 with diuresis  -Add Imdur 30 mg daily -Continue Coreg 3.125 mg bid, look to titrate up as he improves. Heart rate should improve as he diureses  -Continue lisinopril  Echocardiogram showed a drop in LV systolic function with an ejection fraction of 25-30%. Pharmacologic nuclear stress test showed an EF of 28% and possible LAD infarct with peri-infarct ischemia. However, the study was very suboptimal. Given the stress test findings and then drop in ejection fraction, I think we have to exclude  progression of underlying coronary artery disease. Previous cardiac catheterization in 2012 showed mild LAD disease. I recommend proceeding with cardiac catheterization tomorrow. I discussed risks and benefits.    2. . Chest pain: -Troponin negative thus far cardiac cath as outlined above.  4. HTN: -Imdur added as above -Titrate Coreg as his condition improves -Continue lisinopril   5. History of medication noncompliance:  6. ETOH abuse: -he denies excessive use recently.  7. Tobacco abuse: -Cessation advised    Kathlyn Sacramento, MD, [facility] 01/27/2015 5:26 PM

## 2015-01-31 NOTE — Telephone Encounter (Signed)
Should pt be taking both medications at the same time--please advise

## 2015-01-31 NOTE — Telephone Encounter (Signed)
He should be taking Colchicine BID and Indomethacin as needed for flares

## 2015-02-01 ENCOUNTER — Ambulatory Visit (INDEPENDENT_AMBULATORY_CARE_PROVIDER_SITE_OTHER): Payer: Medicare Other | Admitting: Internal Medicine

## 2015-02-01 ENCOUNTER — Encounter: Payer: Self-pay | Admitting: Internal Medicine

## 2015-02-01 VITALS — BP 126/88 | HR 96 | Temp 98.0°F | Wt 179.0 lb

## 2015-02-01 DIAGNOSIS — I509 Heart failure, unspecified: Secondary | ICD-10-CM | POA: Diagnosis not present

## 2015-02-01 DIAGNOSIS — M25511 Pain in right shoulder: Secondary | ICD-10-CM

## 2015-02-01 MED ORDER — TRAMADOL HCL 50 MG PO TABS: 50.0000 mg | ORAL_TABLET | Freq: Three times a day (TID) | ORAL | Status: DC | PRN

## 2015-02-01 NOTE — Telephone Encounter (Signed)
Pt is aware.  

## 2015-02-01 NOTE — Patient Instructions (Signed)

## 2015-02-01 NOTE — Progress Notes (Signed)
Subjective:    Patient ID: Travis [name], male    DOB: [birth year], 59 y.o.   MRN: [id number]  HPI  Pt presents to the clinic today for hospital followup for acute on chronic HF. He went to the hospital with chest pain and shortness of breath. Chest xray showed signs of pulmonary edema. His BNP was 1453.0. Troponins were negative. He was admitted. Cardiology was consulted. His Coreg, Lisinopril and Lasix were all increased. He had a heart cath that was unchanged from prior. He was advised to follow up with cardiology and PCP as outpatient. His cardiology appt is next week. He reports he has not had any chest pain or shortness of breath since his discharge. He is taking all medications as directed. He reports worsening right shoulder pain since his heart cath (he reports they inserted the catheter in his right radial). He has been taking Tylenol for pain without any relief. He is not able to take Ibuprofen (all it does is makes him sweat). He reports he was getting Vicodin in the hospital and it provided him with good relief. He has not seen an orthopedic specialist about his right shoulder pain. He had a xray 01/2014 that showed degenerative changes. He has never had a MRI of the right shoulder that he is aware of.  Review of Systems      Past Medical History  Diagnosis Date  . Chronic systolic CHF (congestive heart failure) ([place])     a. EF 20-25% by echo 12/28/10; b improved to 45-50% on echo in June 2015  . Nonischemic cardiomyopathy ([place])     minimal coronary disease by cath 12/29/10  . Hemoptysis     secondary to pulmonary edema  . Hyperlipidemia   . Chronic anemia   . GI bleed     15 years ago  . Tobacco abuse   . Alcohol abuse   . Osteoarthritis   . Gout   . Diabetes mellitus, type 2 ([place])     pt reports his DM is gone  . 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 albuterol (PROVENTIL HFA;VENTOLIN HFA)  108 (90 BASE) MCG/ACT inhaler Inhale 2 puffs into the lungs every 6 (six) hours as needed for wheezing or shortness of breath. Take 2 puffs every 5-10 minutes up to 6 puffs total over 15 minutes when needed.  Use with a spacer. 1 Inhaler 2  . aspirin 325 MG EC tablet Take 325 mg by mouth daily.    [name] atorvastatin (LIPITOR) 10 MG tablet Take 1 tablet (10 mg total) by mouth daily at 6 PM. (Patient taking differently: Take 10 mg by mouth at bedtime. ) 30 tablet 6  . carvedilol (COREG) 3.125 MG tablet Take 1 tablet (3.125 mg total) by mouth 2 (two) times daily with a meal. 60 tablet 6  . colchicine 0.6 MG tablet TAKE 1 TABLET BY MOUTH TWICE A DAY 60 tablet 2  . cyclobenzaprine (FLEXERIL) 10 MG tablet Take 1 tablet (10 mg total) by mouth 3 (three) times daily as needed for muscle spasms. 30 tablet 0  . diphenoxylate-atropine (LOMOTIL) 2.5-0.025 MG per tablet Take 1 tablet by mouth 4 (four) times daily as needed for diarrhea or loose stools. 30 tablet 0  . etodolac (LODINE) 500 MG tablet Take 1 tablet (500 mg total) by mouth 2 (two) times daily. SCHEDULE FOLLOW UP OFFICE VISIT FOR MORE REFILLS [phone] (Patient taking differently: Take 500 mg by  mouth daily. SCHEDULE FOLLOW UP OFFICE VISIT FOR MORE REFILLS [phone]) 60 tablet 0  . fenofibrate 54 MG tablet Take 1 tablet (54 mg total) by mouth daily. 30 tablet 6  . folic acid (FOLVITE) 1 MG tablet Take 1 tablet (1 mg total) by mouth daily. 30 tablet 6  . furosemide (LASIX) 40 MG tablet Take 1 tablet (40 mg total) by mouth daily. 30 tablet 0  . ibuprofen (ADVIL,MOTRIN) 800 MG tablet Take 800 mg by mouth every 8 (eight) hours as needed for fever, headache, mild pain, moderate pain or cramping.   0  . indomethacin (INDOCIN) 50 MG capsule Take 1 capsule (50 mg total) by mouth 3 (three) times daily as needed. 90 capsule 0  . lisinopril (PRINIVIL,ZESTRIL) 10 MG tablet Take 1 tablet (10 mg total) by mouth daily. 30 tablet 0  . Multiple Vitamin (ONE-A-DAY MENS  PO) Take 1 tablet by mouth daily.     . Omega-3 Fatty Acids (FISH OIL PO) Take 1 capsule by mouth daily.    . ondansetron (ZOFRAN ODT) 4 MG disintegrating tablet Take 1 tablet (4 mg total) by mouth every 8 (eight) hours as needed for nausea or vomiting. 20 tablet 0  . thiamine 100 MG tablet Take 1 tablet (100 mg total) by mouth daily. 30 tablet 0   No current facility-administered medications for this visit.    No Known Allergies  Family History  Problem Relation Age of Onset  . Diabetes Mother   . Hypertension Mother   . Diabetes Father   . Hypertension Father   . Cancer Neg Hx   . Heart disease Neg Hx   . Stroke Neg Hx     Social History   Social History  . Marital Status: Single    Spouse Name: N/A  . Number of Children: N/A  . Years of Education: N/A   Occupational History  . Not on file.   Social History Main Topics  . Smoking status: Current Every Day Smoker -- 1.00 packs/day for 30 years    Types: Cigarettes  . Smokeless tobacco: Never Used  . Alcohol Use: 0.6 oz/week    1 Glasses of wine per week     Comment: occasionally  . Drug Use: Not on file  . Sexual Activity: Yes   Other Topics Concern  . Not on file   Social History Narrative     Constitutional: Denies fever, malaise, fatigue, headache or abrupt weight changes.  Respiratory: Denies difficulty breathing, shortness of breath, cough or sputum production.   Cardiovascular: Denies chest pain, chest tightness, palpitations or swelling in the hands or feet.  Musculoskeletal: Pt reports right shoulder pain. Denies muscle pain or joint  swelling.   No other specific complaints in a complete review of systems (except as listed in HPI above).  Objective:   Physical Exam  BP 126/88 mmHg  Pulse 96  Temp(Src) 98 F (36.7 C) (Oral)  Wt 179 lb (81.194 kg)  SpO2 98% Wt Readings from Last 3 Encounters:  02/01/15 179 lb (81.194 kg)  01/26/15 174 lb 4.8 oz (79.062 kg)  01/24/15 180 lb (81.647 kg)     General: Appears his stated age,  in NAD. Cardiovascular: Normal rate and rhythm. S1,S2 noted.  Radial pulses 2+. Pulmonary/Chest: Normal effort and positive vesicular breath sounds. No respiratory distress. No wheezes, rales or ronchi noted.  Musculoskeletal: Decreased internal and external rotation of the right shoulder. Pain with palpation of the right AC joint. Positive drop can test  on the right. Strength 4/5  RUE. Hand grips equal. Neurological: Alert and oriented. Sensation intact to BUE.   BMET    Component Value Date/Time   NA 140 01/28/2015 0527   NA 141 04/20/2014 1019   NA 145* 02/25/2012 1409   K 3.7 01/28/2015 0527   K 3.7 04/20/2014 1019   CL 102 01/28/2015 0527   CL 106 04/20/2014 1019   CO2 31 01/28/2015 0527   CO2 30 04/20/2014 1019   GLUCOSE 115* 01/28/2015 0527   GLUCOSE 102* 04/20/2014 1019   GLUCOSE 87 02/25/2012 1409   BUN 29* 01/28/2015 0527   BUN 14 04/20/2014 1019   BUN 11 02/25/2012 1409   CREATININE 1.36* 01/28/2015 0527   CREATININE 1.18 04/20/2014 1019   CALCIUM 9.3 01/28/2015 0527   CALCIUM 9.3 04/20/2014 1019   GFRNONAA 55* 01/28/2015 0527   GFRNONAA >60 04/20/2014 1019   GFRNONAA >60 11/19/2012 1107   GFRAA >60 01/28/2015 0527   GFRAA >60 04/20/2014 1019   GFRAA >60 11/19/2012 1107    Lipid Panel     Component Value Date/Time   CHOL 153 12/31/2013 1403   TRIG 221.0* 12/31/2013 1403   HDL 28.70* 12/31/2013 1403   CHOLHDL 5 12/31/2013 1403   VLDL 44.2* 12/31/2013 1403   LDLCALC UNABLE TO CALCULATE IF TRIGLYCERIDE OVER 400 mg/dL 09/14/2013 0510    CBC    Component Value Date/Time   WBC 9.1 01/28/2015 0527   WBC 7.9 04/20/2014 1019   RBC 4.23* 01/28/2015 0527   RBC 4.65 04/20/2014 1019   RBC 2.93* 12/28/2010 0345   HGB 13.0 01/28/2015 0527   HGB 13.5 04/20/2014 1019   HCT 40.3 01/28/2015 0527   HCT 41.8 04/20/2014 1019   PLT 221 01/28/2015 0527   PLT 348 04/20/2014 1019   MCV 95.2 01/28/2015 0527   MCV 90 04/20/2014  1019   MCH 30.8 01/28/2015 0527   MCH 29.1 04/20/2014 1019   MCHC 32.4 01/28/2015 0527   MCHC 32.4 04/20/2014 1019   RDW 15.1* 01/28/2015 0527   RDW 16.2* 04/20/2014 1019   LYMPHSABS 1.5 01/26/2015 1020   LYMPHSABS 2.1 04/20/2014 1019   MONOABS 0.3 01/26/2015 1020   MONOABS 0.5 04/20/2014 1019   EOSABS 0.9* 01/26/2015 1020   EOSABS 0.3 04/20/2014 1019   BASOSABS 0.0 01/26/2015 1020   BASOSABS 0.1 04/20/2014 1019   BASOSABS 0 06/13/2012 1515    Hgb A1C Lab Results  Component Value Date   HGBA1C 6.6* 12/31/2013         Assessment & Plan:   Hospital follow up for acute on chronic CHF:  Hospital notes, labs and imaging reviewed He will continue Coreg, Lisinopril and Lasix He is euvolemic on exam today He will follow up with cardiology outpatient   Right shoulder pain:  Xray reviewed He needs MRI but wants to hold off due to financial reasons He wants to proceed with orthopedic referral Will give RX for Tramadol 50 mg TID, # 90 refills  RTC as needed or if symptoms persist or worsen

## 2015-02-01 NOTE — Progress Notes (Signed)
Pre visit review using our clinic review tool, if applicable. No additional management support is needed unless otherwise documented below in the visit note. 

## 2015-02-04 ENCOUNTER — Ambulatory Visit (INDEPENDENT_AMBULATORY_CARE_PROVIDER_SITE_OTHER): Payer: Medicare Other | Admitting: Cardiovascular Disease

## 2015-02-04 ENCOUNTER — Encounter: Payer: Self-pay | Admitting: Cardiovascular Disease

## 2015-02-04 VITALS — BP 106/80 | HR 95 | Ht 76.0 in | Wt 178.2 lb

## 2015-02-04 DIAGNOSIS — I428 Other cardiomyopathies: Secondary | ICD-10-CM

## 2015-02-04 DIAGNOSIS — I5022 Chronic systolic (congestive) heart failure: Secondary | ICD-10-CM | POA: Diagnosis not present

## 2015-02-04 DIAGNOSIS — I429 Cardiomyopathy, unspecified: Secondary | ICD-10-CM

## 2015-02-04 DIAGNOSIS — Z720 Tobacco use: Secondary | ICD-10-CM

## 2015-02-04 DIAGNOSIS — F17200 Nicotine dependence, unspecified, uncomplicated: Secondary | ICD-10-CM

## 2015-02-04 MED ORDER — ASPIRIN EC 81 MG PO TBEC: 81.0000 mg | DELAYED_RELEASE_TABLET | Freq: Every day | ORAL | Status: DC

## 2015-02-04 MED ORDER — CARVEDILOL 6.25 MG PO TABS: 6.2500 mg | ORAL_TABLET | Freq: Two times a day (BID) | ORAL | Status: DC

## 2015-02-04 NOTE — Discharge Summary (Signed)
[facility] NAME: Travis [name]    MR#:  [id number]  DATE OF BIRTH:  02/28/1956  DATE OF ADMISSION:  01/26/2015 ADMITTING PHYSICIAN: Fritzi Mandes, MD  DATE OF DISCHARGE: 01/28/2015  3:58 PM  PRIMARY CARE PHYSICIAN: [name], NP    ADMISSION DIAGNOSIS:  Congestive heart failure, unspecified congestive heart failure chronicity, unspecified congestive heart failure type ([place]) [I50.9] DISCHARGE DIAGNOSIS:  Active Problems:   CHF (congestive heart failure), NYHA class I (HCC)   Chest pain with moderate risk for cardiac etiology  SECONDARY DIAGNOSIS:   Past Medical History  Diagnosis Date  . Chronic systolic CHF (congestive heart failure) ([place])     a. EF 20-25% by echo 12/28/10; b improved to 45-50% on echo in June 2015  . Nonischemic cardiomyopathy ([place])     minimal coronary disease by cath 12/29/10  . Hemoptysis     secondary to pulmonary edema  . Hyperlipidemia   . Chronic anemia   . GI bleed     15 years ago  . Tobacco abuse   . Alcohol abuse   . Osteoarthritis   . Gout   . Diabetes mellitus, type 2 ([place])     pt reports his DM is gone  . 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 Fuson with past medical history of nonischemic cardiac myopathy with EF of 20-25% in 2012, E echo in 2015 showed EF of 45-50% with history of ongoing tobacco abuse and occasional alcohol use hypertension admitted for increasing chest tightness and shortness of breath with Acute hypoxic respiratory failure secondary to acute on chronic congestive heart failure systolic. Please see Dr Gus Height Patel's dictated H & P for further details. He is agressively diuresed with Lasix.  Cardio c/s was obtained with Dr Fletcher Anon who recommended echo which was done and showed a drop in LV systolic function with an ejection fraction of 25-30%. Pharmacologic nuclear stress test was done which showed an EF of 28% and possible LAD  infarct with peri-infarct ischemia. However, the study was very suboptimal.  Given the stress test findings and then drop in ejection fraction, cardio recommended to proceeding with cardiac catheterization on 11/4 which showed    Prox RCA to Mid RCA lesion, 10% stenosed.  Prox LAD to Mid LAD lesion, 30% stenosed.  1. Mild one-vessel coronary artery disease which does not seem to be different from most recent coronary angiography. 2. Moderate to severely reduced LV systolic function by noninvasive angiography. 3. Mildly elevated left ventricular end-diastolic pressure.  Recommendations: The patient has nonischemic cardiomyopathy likely alcohol induced with poor adherence to medical therapy. Continue medical therapy.  Patient was feeling much better by 4th of Nov and was D/C home in stable condition. He was agreeable with D/C plans. DISCHARGE CONDITIONS:   stable CONSULTS OBTAINED:   Cardio - Dr Fletcher Anon  DRUG ALLERGIES:  No Known Allergies DISCHARGE MEDICATIONS:   Discharge Medication List as of 01/28/2015  3:35 PM    START taking these medications   Details  furosemide (LASIX) 40 MG tablet Take 1 tablet (40 mg total) by mouth daily., Starting 01/28/2015, Until Discontinued, Normal      CONTINUE these medications which have CHANGED   Details  lisinopril (PRINIVIL,ZESTRIL) 10 MG tablet Take 1 tablet (10 mg total) by mouth daily., Starting 01/28/2015, Until Discontinued, Normal      CONTINUE these medications which have NOT CHANGED   Details  albuterol (PROVENTIL HFA;VENTOLIN HFA)  108 (90 BASE) MCG/ACT inhaler Inhale 2 puffs into the lungs every 6 (six) hours as needed for wheezing or shortness of breath. Take 2 puffs every 5-10 minutes up to 6 puffs total over 15 minutes when needed.  Use with a spacer., Starting 01/24/2015, Until Discontinued, Print    aspirin 325 MG EC tablet Take 325 mg by mouth daily., Until Discontinued, Historical Med    atorvastatin (LIPITOR) 10 MG  tablet Take 1 tablet (10 mg total) by mouth daily at 6 PM., Starting 05/27/2014, Until Discontinued, Normal    carvedilol (COREG) 3.125 MG tablet Take 1 tablet (3.125 mg total) by mouth 2 (two) times daily with a meal., Starting 12/29/2014, Until Discontinued, Normal    cyclobenzaprine (FLEXERIL) 10 MG tablet Take 1 tablet (10 mg total) by mouth 3 (three) times daily as needed for muscle spasms., Starting 12/31/2013, Until Discontinued, Normal    diphenoxylate-atropine (LOMOTIL) 2.5-0.025 MG per tablet Take 1 tablet by mouth 4 (four) times daily as needed for diarrhea or loose stools., Starting 05/03/2014, Until Discontinued, Print    etodolac (LODINE) 500 MG tablet Take 1 tablet (500 mg total) by mouth 2 (two) times daily. SCHEDULE FOLLOW UP OFFICE VISIT FOR MORE REFILLS [phone], Starting 10/27/2014, Until Discontinued, Normal    fenofibrate 54 MG tablet Take 1 tablet (54 mg total) by mouth daily., Starting 12/28/2014, Until Discontinued, Normal    folic acid (FOLVITE) 1 MG tablet Take 1 tablet (1 mg total) by mouth daily., Starting 12/28/2014, Until Discontinued, Normal    ibuprofen (ADVIL,MOTRIN) 800 MG tablet Take 800 mg by mouth every 8 (eight) hours as needed for fever, headache, mild pain, moderate pain or cramping. , Until Discontinued, Historical Med    Multiple Vitamin (ONE-A-DAY MENS PO) Take 1 tablet by mouth daily. , Until Discontinued, Historical Med    Omega-3 Fatty Acids (FISH OIL PO) Take 1 capsule by mouth daily., Until Discontinued, Historical Med    ondansetron (ZOFRAN ODT) 4 MG disintegrating tablet Take 1 tablet (4 mg total) by mouth every 8 (eight) hours as needed for nausea or vomiting., Starting 05/03/2014, Until Discontinued, Print    thiamine 100 MG tablet Take 1 tablet (100 mg total) by mouth daily., Starting 09/16/2013, Until Discontinued, Normal    colchicine 0.6 MG tablet TAKE 1 TABLET BY MOUTH TWICE A DAY, Normal    indomethacin (INDOCIN) 50 MG capsule TAKE 1 CAPSULE  BY MOUTH 3 TIMES A DAY WITH FOOD, Normal         DISCHARGE INSTRUCTIONS:   DIET:  Heart Healthy diet  DISCHARGE CONDITION:  Good  ACTIVITY:  Activity as tolerated  OXYGEN:  Home Oxygen: No.   Oxygen Delivery: room air  DISCHARGE LOCATION:  home   If you experience worsening of your admission symptoms, develop shortness of breath, life threatening emergency, suicidal or homicidal thoughts you must seek medical attention immediately by calling 911 or calling your MD immediately  if symptoms less severe.  You Must read complete instructions/literature along with all the possible adverse reactions/side effects for all the Medicines you take and that have been prescribed to you. Take any new Medicines after you have completely understood and accpet all the possible adverse reactions/side effects.   Please note  You were cared for by a hospitalist during your hospital stay. If you have any questions about your discharge medications or the care you received while you were in the hospital after you are discharged, you can call the unit and asked to speak with  the hospitalist on call if the hospitalist that took care of you is not available. Once you are discharged, your primary care physician will handle any further medical issues. Please note that NO REFILLS for any discharge medications will be authorized once you are discharged, as it is imperative that you return to your primary care physician (or establish a relationship with a primary care physician if you do not have one) for your aftercare needs so that they can reassess your need for medications and monitor your lab values.    On the day of Discharge: VITAL SIGNS:  Blood pressure 112/77, pulse 90, temperature 98.3 F (36.8 C), temperature source Oral, resp. rate 14, height 6\' 4"  (1.93 m), weight 79.062 kg (174 lb 4.8 oz), SpO2 98 %. PHYSICAL EXAMINATION:  GENERAL:  59 y.o.-year-old patient lying in the bed with no acute  distress.  EYES: Pupils equal, round, reactive to light and accommodation. No scleral icterus. Extraocular muscles intact.  HEENT: Head atraumatic, normocephalic. Oropharynx and nasopharynx clear.  NECK:  Supple, no jugular venous distention. No thyroid enlargement, no tenderness.  LUNGS: Normal breath sounds bilaterally, no wheezing, rales,rhonchi or crepitation. No use of accessory muscles of respiration.  CARDIOVASCULAR: S1, S2 normal. No murmurs, rubs, or gallops.  ABDOMEN: Soft, non-tender, non-distended. Bowel sounds present. No organomegaly or mass.  EXTREMITIES: No pedal edema, cyanosis, or clubbing.  NEUROLOGIC: Cranial nerves II through XII are intact. Muscle strength 5/5 in all extremities. Sensation intact. Gait not checked.  PSYCHIATRIC: The patient is alert and oriented x 3.  SKIN: No obvious rash, lesion, or ulcer.    Management plans discussed with the patient, family and they are in agreement.  CODE STATUS: Full Code  TOTAL TIME TAKING CARE OF THIS PATIENT: 55 minutes.    [facility], Mirah Nevins M.D on 02/04/2015 at 7:26 AM  Between 7am to 6pm - Pager - [phone]  After 6pm go to [email or web address] - password [email or web address] [facility]  Office  [phone]  CC: Primary care physician; [name], NP [place], MD

## 2015-02-04 NOTE — Progress Notes (Signed)
HPI  This is a 59 year old male who is here today for a follow-up visit after recent hospitalization at [facility] for acute on chronic systolic heart failure. 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 His previous ejection fraction in June 2015 was 45-50%. He had poor follow-up after that. He was hospitalized this month at [facility] with worsening dyspnea and chest pain. He was mildly tachycardic. Cardiac enzymes were negative. Echocardiogram showed worsening LV systolic dysfunction with an ejection fraction of 25-30%. Due to that, he underwent cardiac catheterization which showed mild nonobstructive one-vessel coronary artery disease. LVEDP was mildly elevated. He improved with diuresis. He quit alcohol completely since his hospital discharge. He continues to smoke but is trying to decrease. He denies any chest pain. He reports significant improvement in dyspnea. No dizziness or syncope.  No Known Allergies   Current Outpatient Prescriptions on File Prior to Visit  Medication Sig Dispense Refill  . albuterol (PROVENTIL HFA;VENTOLIN HFA) 108 (90 BASE) MCG/ACT inhaler Inhale 2 puffs into the lungs every 6 (six) hours as needed for wheezing or shortness of breath. Take 2 puffs every 5-10 minutes up to 6 puffs total over 15 minutes when needed.  Use with a spacer. 1 Inhaler 2  . atorvastatin (LIPITOR) 10 MG tablet Take 1 tablet (10 mg total) by mouth daily at 6 PM. (Patient taking differently: Take 10 mg by mouth at bedtime. ) 30 tablet 6  . colchicine 0.6 MG tablet TAKE 1 TABLET BY MOUTH TWICE A DAY 60 tablet 2  . cyclobenzaprine (FLEXERIL) 10 MG tablet Take 1 tablet (10 mg total) by mouth 3 (three) times daily as needed for muscle spasms. 30 tablet 0  . diphenoxylate-atropine (LOMOTIL) 2.5-0.025 MG per tablet Take 1 tablet by  mouth 4 (four) times daily as needed for diarrhea or loose stools. 30 tablet 0  . etodolac (LODINE) 500 MG tablet Take 1 tablet (500 mg total) by mouth 2 (two) times daily. SCHEDULE FOLLOW UP OFFICE VISIT FOR MORE REFILLS [phone] (Patient taking differently: Take 500 mg by mouth daily. SCHEDULE FOLLOW UP OFFICE VISIT FOR MORE REFILLS [phone]) 60 tablet 0  . fenofibrate 54 MG tablet Take 1 tablet (54 mg total) by mouth daily. 30 tablet 6  . folic acid (FOLVITE) 1 MG tablet Take 1 tablet (1 mg total) by mouth daily. 30 tablet 6  . furosemide (LASIX) 40 MG tablet Take 1 tablet (40 mg total) by mouth daily. 30 tablet 0  . indomethacin (INDOCIN) 50 MG capsule Take 1 capsule (50 mg total) by mouth 3 (three) times daily as needed. 90 capsule 0  . lisinopril (PRINIVIL,ZESTRIL) 10 MG tablet Take 1 tablet (10 mg total) by mouth daily. 30 tablet 0  . Multiple Vitamin (ONE-A-DAY MENS PO) Take 1 tablet by mouth daily.     . Omega-3 Fatty Acids (FISH OIL PO) Take 1 capsule by mouth daily.    . ondansetron (ZOFRAN ODT) 4 MG disintegrating tablet Take 1 tablet (4 mg total) by mouth every 8 (eight) hours as needed for nausea or vomiting. 20 tablet 0  . thiamine 100 MG tablet Take 1 tablet (100 mg total) by mouth daily. 30 tablet 0  . traMADol (ULTRAM) 50 MG tablet Take 1 tablet (50 mg total) by mouth every 8 (eight) hours as needed. 90 tablet 0   No current facility-administered medications on file prior to  visit.     Past Medical History  Diagnosis Date  . Chronic systolic CHF (congestive heart failure) ([place])     a. EF 20-25% by echo 12/28/10; b improved to 45-50% on echo in June 2015  . Nonischemic cardiomyopathy ([place])     minimal coronary disease by cath 12/29/10  . Hemoptysis     secondary to pulmonary edema  . Hyperlipidemia   . Chronic anemia   . GI bleed     15 years ago  . Tobacco abuse   . Alcohol abuse   . Osteoarthritis   . Gout   . Diabetes mellitus, type 2 ([place])     pt reports  his DM is gone  . 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 Coronary artery disease     Cardiac catheterization in November 2016 showed mild 30% proximal LAD stenosis with no evidence of obstructive disease.  . 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 Cardiac catheterization    . Hip arthroplasty Right 03/15/2013    Procedure: ARTHROPLASTY BIPOLAR HIP;  Surgeon: [name], MD;  Location: [place];  Service: Orthopedics;  Laterality: Right;  . Cardiac catheterization N/A 01/28/2015    Procedure: Left Heart Cath and Coronary Angiography;  Surgeon: Wellington Hampshire, MD;  Location: [place] [facility];  Service: Cardiovascular;  Laterality: N/A;     Family History  Problem Relation Age of Onset  . Diabetes Mother   . Hypertension Mother   . Diabetes Father   . Hypertension Father   . Cancer Neg Hx   . Heart disease Neg Hx   . Stroke Neg Hx      Social History   Social History  . Marital Status: Single    Spouse Name: N/A  . Number of Children: N/A  . Years of Education: N/A   Occupational History  . Not on file.   Social History Main Topics  . Smoking status: Current Every Day Smoker -- 0.25 packs/day for 30 years    Types: Cigarettes  . Smokeless tobacco: Never Used  . Alcohol Use: 0.6 oz/week    1 Glasses of wine per week     Comment: occasionally  . Drug Use: Not on file  . Sexual Activity: Yes   Other Topics Concern  . Not on file   Social History Narrative      PHYSICAL EXAM   BP 106/80 mmHg  Pulse 95  Ht 6\' 4"  (1.93 m)  Wt 178 lb 4 oz (80.854 kg)  BMI 21.71 kg/m2 Constitutional: He is oriented to person, place, and time. He appears well-developed and well-nourished. No distress.  HENT: No nasal discharge.  Head: Normocephalic and atraumatic.  Eyes: Pupils are equal and round.  No discharge. Neck: Normal range of motion. Neck supple. No  JVD present. No thyromegaly present.  Cardiovascular: Normal rate, regular rhythm, normal heart sounds. Exam reveals no gallop and no friction rub. No murmur heard.  Pulmonary/Chest: Effort normal and breath sounds normal. No stridor. No respiratory distress. He has no wheezes. He has no rales. He exhibits no tenderness.  Abdominal: Soft. Bowel sounds are normal. He exhibits no distension. There is no tenderness. There is no rebound and no guarding.  Musculoskeletal: Normal range of motion. He exhibits no edema and no tenderness.  Neurological: He is alert and oriented to person, place, and time. Coordination normal.  Skin: Skin is  warm and dry. No rash noted. He is not diaphoretic. No erythema. No pallor.  Psychiatric: He has a normal mood and affect. His behavior is normal. Judgment and thought content normal.  Right radial pulse is normal with no hematoma.     EKG: Normal sinus rhythm with left ventricular hypertrophy with QRS widening and repolarization abnormalities   ASSESSMENT AND PLAN

## 2015-02-04 NOTE — Assessment & Plan Note (Signed)
Due to nonischemic cardiomyopathy likely alcohol induced. He appears to be euvolemic today. I requested basic metabolic profile. I elected to increase the dose of carvedilol to 6.25 mg twice daily especially that his heart rate continues to be on the high side. We might not be able to advance the dose any further due to relatively low blood pressure. I might consider Corlanor.  I think his blood pressure is too low to consider Entresto. I strongly advised him not to drink any alcohol. He is following low-sodium diet. Small dose spironolactone can be considered upon follow-up if renal function is stable.

## 2015-02-04 NOTE — Patient Instructions (Signed)
Medication Instructions:  Your physician has recommended you make the following change in your medication:  DECREASE aspirin to 81mg  once per day INCREASE coreg to 6.25mg  twice per day   Labwork: BMET  Testing/Procedures: none  Follow-Up: Your physician recommends that you schedule a follow-up appointment in: one month with Dr. Fletcher Anon   Any Other Special Instructions Will Be Listed Below (If Applicable).     If you need a refill on your cardiac medications before your next appointment, please call your pharmacy.

## 2015-02-04 NOTE — Assessment & Plan Note (Signed)
I discussed with him the importance of smoking cessation. 

## 2015-02-05 LAB — BASIC METABOLIC PANEL
BUN/Creatinine Ratio: 16 (ref 9–20)
BUN: 20 mg/dL (ref 6–24)
CALCIUM: 10 mg/dL (ref 8.7–10.2)
CO2: 24 mmol/L (ref 18–29)
CREATININE: 1.26 mg/dL (ref 0.76–1.27)
Chloride: 96 mmol/L — ABNORMAL LOW (ref 97–106)
GFR, EST AFRICAN AMERICAN: 72 mL/min/{1.73_m2} (ref >59)
GFR, EST NON AFRICAN AMERICAN: 62 mL/min/{1.73_m2} (ref >59)
Glucose: 132 mg/dL — ABNORMAL HIGH (ref 65–99)
Potassium: 5.1 mmol/L (ref 3.5–5.2)
Sodium: 140 mmol/L (ref 136–144)

## 2015-02-10 DIAGNOSIS — M19011 Primary osteoarthritis, right shoulder: Secondary | ICD-10-CM | POA: Diagnosis not present

## 2015-02-16 ENCOUNTER — Encounter: Payer: Self-pay | Admitting: Family

## 2015-02-16 ENCOUNTER — Ambulatory Visit: Payer: Medicare Other | Attending: Family | Admitting: Family

## 2015-02-16 VITALS — BP 116/81 | HR 94 | Resp 18 | Ht 76.0 in | Wt 179.0 lb

## 2015-02-16 DIAGNOSIS — I5022 Chronic systolic (congestive) heart failure: Secondary | ICD-10-CM | POA: Diagnosis not present

## 2015-02-16 DIAGNOSIS — I426 Alcoholic cardiomyopathy: Secondary | ICD-10-CM | POA: Insufficient documentation

## 2015-02-16 DIAGNOSIS — D649 Anemia, unspecified: Secondary | ICD-10-CM | POA: Diagnosis not present

## 2015-02-16 DIAGNOSIS — F1010 Alcohol abuse, uncomplicated: Secondary | ICD-10-CM | POA: Diagnosis not present

## 2015-02-16 DIAGNOSIS — I950 Idiopathic hypotension: Secondary | ICD-10-CM

## 2015-02-16 DIAGNOSIS — I2510 Atherosclerotic heart disease of native coronary artery without angina pectoris: Secondary | ICD-10-CM | POA: Insufficient documentation

## 2015-02-16 DIAGNOSIS — I429 Cardiomyopathy, unspecified: Secondary | ICD-10-CM | POA: Diagnosis not present

## 2015-02-16 DIAGNOSIS — Z7982 Long term (current) use of aspirin: Secondary | ICD-10-CM | POA: Diagnosis not present

## 2015-02-16 DIAGNOSIS — E785 Hyperlipidemia, unspecified: Secondary | ICD-10-CM | POA: Insufficient documentation

## 2015-02-16 DIAGNOSIS — F17200 Nicotine dependence, unspecified, uncomplicated: Secondary | ICD-10-CM

## 2015-02-16 DIAGNOSIS — Z79899 Other long term (current) drug therapy: Secondary | ICD-10-CM | POA: Diagnosis not present

## 2015-02-16 DIAGNOSIS — E119 Type 2 diabetes mellitus without complications: Secondary | ICD-10-CM | POA: Insufficient documentation

## 2015-02-16 DIAGNOSIS — M109 Gout, unspecified: Secondary | ICD-10-CM | POA: Insufficient documentation

## 2015-02-16 DIAGNOSIS — F17210 Nicotine dependence, cigarettes, uncomplicated: Secondary | ICD-10-CM | POA: Diagnosis not present

## 2015-02-16 DIAGNOSIS — I959 Hypotension, unspecified: Secondary | ICD-10-CM | POA: Insufficient documentation

## 2015-02-16 NOTE — Progress Notes (Signed)
Subjective:    Patient ID: Travis [name], male    DOB: 1956/03/17, 59 y.o.   MRN: [id number]  Congestive Heart Failure Presents for initial visit. The disease course has been stable. Associated symptoms include fatigue. Pertinent negatives include no abdominal pain, chest pain, chest pressure, edema, palpitations, paroxysmal nocturnal dyspnea or shortness of breath. The symptoms have been stable. Past treatments include ACE inhibitors, beta blockers and salt and fluid restriction. The treatment provided moderate relief. Compliance with prior treatments has been good. His past medical history is significant for anemia and CAD. Compliance with total regimen is 76-100%.   Past Medical History  Diagnosis Date  . Chronic systolic CHF (congestive heart failure) ([place])     a. EF 20-25% by echo 12/28/10; b improved to 45-50% on echo in June 2015  . Nonischemic cardiomyopathy ([place])     minimal coronary disease by cath 12/29/10  . Hemoptysis     secondary to pulmonary edema  . Hyperlipidemia   . Chronic anemia   . GI bleed     15 years ago  . Tobacco abuse   . Alcohol abuse   . Osteoarthritis   . Gout   . Diabetes mellitus, type 2 ([place])     pt reports his DM is gone  . 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 Coronary artery disease     Cardiac catheterization in November 2016 showed mild 30% proximal LAD stenosis with no evidence of obstructive disease.  . 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 Cardiac catheterization    . Hip arthroplasty Right 03/15/2013    Procedure: ARTHROPLASTY BIPOLAR HIP;  Surgeon: [name], MD;  Location: [place];  Service: Orthopedics;  Laterality: Right;  . Cardiac catheterization N/A 01/28/2015    Procedure: Left Heart Cath and Coronary Angiography;  Surgeon: Wellington Hampshire, MD;  Location: [place] [facility];  Service: Cardiovascular;   Laterality: N/A;    Family History  Problem Relation Age of Onset  . Diabetes Mother   . Hypertension Mother   . Diabetes Father   . Hypertension Father   . Cancer Neg Hx   . Heart disease Neg Hx   . Stroke Neg Hx     Social History  Substance Use Topics  . Smoking status: Current Every Day Smoker -- 0.25 packs/day for 30 years    Types: Cigarettes  . Smokeless tobacco: Never Used  . Alcohol Use: 0.6 oz/week    1 Glasses of wine per week     Comment: occasionally    No Known Allergies  Prior to Admission medications   Medication Sig Start Date End Date Taking? Authorizing Provider  albuterol (PROVENTIL HFA;VENTOLIN HFA) 108 (90 BASE) MCG/ACT inhaler Inhale 2 puffs into the lungs every 6 (six) hours as needed for wheezing or shortness of breath. Take 2 puffs every 5-10 minutes up to 6 puffs total over 15 minutes when needed.  Use with a spacer. 01/24/15  Yes Ahmed Prima, MD  aspirin EC 81 MG tablet Take 1 tablet (81 mg total) by mouth daily. 02/04/15  Yes Wellington Hampshire, MD  atorvastatin (LIPITOR) 10 MG tablet Take 1 tablet (10 mg total) by mouth daily at 6 PM. Patient taking differently: Take 10 mg by mouth at bedtime.  05/27/14  Yes Amy D Clegg, NP  carvedilol (COREG) 6.25 MG tablet Take 1 tablet (6.25 mg total)  by mouth 2 (two) times daily with a meal. 02/04/15  Yes Wellington Hampshire, MD  colchicine 0.6 MG tablet TAKE 1 TABLET BY MOUTH TWICE A DAY 01/31/15  Yes [name], NP  cyclobenzaprine (FLEXERIL) 10 MG tablet Take 1 tablet (10 mg total) by mouth 3 (three) times daily as needed for muscle spasms. 12/31/13  Yes [name], NP  diphenoxylate-atropine (LOMOTIL) 2.5-0.025 MG per tablet Take 1 tablet by mouth 4 (four) times daily as needed for diarrhea or loose stools. 05/03/14  Yes [name], NP  etodolac (LODINE) 500 MG tablet Take 1 tablet (500 mg total) by mouth 2 (two) times daily. SCHEDULE FOLLOW UP OFFICE VISIT FOR MORE REFILLS [phone] Patient taking  differently: Take 500 mg by mouth daily. SCHEDULE FOLLOW UP OFFICE VISIT FOR MORE REFILLS [phone] 10/27/14  Yes [name], NP  fenofibrate 54 MG tablet Take 1 tablet (54 mg total) by mouth daily. 12/28/14  Yes Amy D Clegg, NP  folic acid (FOLVITE) 1 MG tablet Take 1 tablet (1 mg total) by mouth daily. 12/28/14  Yes Amy D Clegg, NP  furosemide (LASIX) 40 MG tablet Take 1 tablet (40 mg total) by mouth daily. 01/28/15  Yes Max Sane, MD  indomethacin (INDOCIN) 50 MG capsule Take 1 capsule (50 mg total) by mouth 3 (three) times daily as needed. Patient taking differently: Take 50 mg by mouth 2 (two) times daily with a meal.  01/31/15  Yes [name], NP  lisinopril (PRINIVIL,ZESTRIL) 10 MG tablet Take 1 tablet (10 mg total) by mouth daily. 01/28/15  Yes Max Sane, MD  Multiple Vitamin (ONE-A-DAY MENS PO) Take 1 tablet by mouth daily.    Yes Historical Provider, MD  Omega-3 Fatty Acids (FISH OIL PO) Take 1 capsule by mouth daily.   Yes Historical Provider, MD  thiamine 100 MG tablet Take 1 tablet (100 mg total) by mouth daily. 09/16/13  Yes Thurnell Lose, MD  traMADol (ULTRAM) 50 MG tablet Take 1 tablet (50 mg total) by mouth every 8 (eight) hours as needed. 02/01/15  Yes [name], NP      Review of Systems  Constitutional: Positive for fatigue. Negative for appetite change.  HENT: Positive for congestion. Negative for postnasal drip and sore throat.   Eyes: Positive for visual disturbance (blind spot center of left eye). Negative for pain.  Respiratory: Positive for cough (on occasion). Negative for chest tightness, shortness of breath and wheezing.   Cardiovascular: Negative for chest pain, palpitations and leg swelling.  Gastrointestinal: Negative for abdominal pain and abdominal distention.  Endocrine: Negative.   Genitourinary: Negative.   Musculoskeletal: Positive for back pain (mild center of back) and arthralgias (right shoulder).  Skin: Negative.   Allergic/Immunologic:  Negative.   Neurological: Negative for dizziness, light-headedness and headaches.  Hematological: Negative for adenopathy. Does not bruise/bleed easily.  Psychiatric/Behavioral: Negative for sleep disturbance (sleeping on 2-3 pillows) and dysphoric mood. The patient is not nervous/anxious.        Objective:   Physical Exam  Constitutional: He is oriented to person, place, and time. He appears well-developed and well-nourished.  HENT:  Head: Normocephalic and atraumatic.  Eyes: Conjunctivae are normal. Right eye exhibits no discharge. Left eye exhibits no discharge.  Neck: Normal range of motion. Neck supple.  Cardiovascular: Regular rhythm.  Tachycardia present.   Pulmonary/Chest: Effort normal. He has no wheezes. He has no rales.  Abdominal: Soft. He exhibits no distension. There is no tenderness.  Musculoskeletal: He exhibits no edema or tenderness.  Neurological: He is alert and oriented to person, place, and time.  Skin: Skin is warm and dry.  Psychiatric: He has a normal mood and affect. His behavior is normal.  Nursing note and vitals reviewed.   BP 116/81 mmHg  Pulse 94  Resp 18  Ht 6\' 4"  (1.93 m)  Wt 179 lb (81.194 kg)  BMI 21.80 kg/m2  SpO2 100%       Assessment & Plan:  1: Chronic heart failure with reduced ejection fraction- Patient presents with some fatigue upon exertion which he describes as being mild in nature. When he does get tired, he will stop what he's doing to rest until his energy level returns. He does say that he walks on a daily basis. He is already weighing himself daily and says that his home weight is 170 pounds. Instructed to call for an overnight weight gain of >2 pounds or a weekly weight gain of >5 pounds. He does add salt to his food but is trying to decrease his usage and he admits that he doesn't review food labels. Discussed the importance of following a 2000mg  sodium diet and to not add any additional salt to his food. Written information  regarding the diet given to him as well. Did receive his flu vaccine already. Has recently had his carvedilol increased to 6.25mg  twice daily.  2: Hypotension- Blood pressure on the low side although he doesn't have any dizziness. Discussed possibly decreasing his diuretic at future visits especially if it would allow more blood pressure to titrate up medications.  3: Tobacco use- Patient says that he smokes about 1 ppd of cigarettes but says that he's switched to the "light" cigarettes.  Discussed complete cessation for 3 minutes with him.   He sees his cardiologist in 1 month so will have patient return back here in 2 months.

## 2015-02-16 NOTE — Patient Instructions (Addendum)
Continue weighing daily and call for an overnight weight gain of > 2 pounds or a weekly weight gain of >5 pounds.  Do not add salt to the food.    Smoking Cessation, Tips for Success If you are ready to quit smoking, congratulations! You have chosen to help yourself be healthier. Cigarettes bring nicotine, tar, carbon monoxide, and other irritants into your body. Your lungs, heart, and blood vessels will be able to work better without these poisons. There are many different ways to quit smoking. Nicotine gum, nicotine patches, a nicotine inhaler, or nicotine nasal spray can help with physical craving. Hypnosis, support groups, and medicines help break the habit of smoking. WHAT THINGS CAN I DO TO MAKE QUITTING EASIER?  Here are some tips to help you quit for good:  Pick a date when you will quit smoking completely. Tell all of your friends and family about your plan to quit on that date.  Do not try to slowly cut down on the number of cigarettes you are smoking. Pick a quit date and quit smoking completely starting on that day.  Throw away all cigarettes.   Clean and remove all ashtrays from your home, work, and car.  On a card, write down your reasons for quitting. Carry the card with you and read it when you get the urge to smoke.  Cleanse your body of nicotine. Drink enough water and fluids to keep your urine clear or pale yellow. Do this after quitting to flush the nicotine from your body.  Learn to predict your moods. Do not let a bad situation be your excuse to have a cigarette. Some situations in your life might tempt you into wanting a cigarette.  Never have "just one" cigarette. It leads to wanting another and another. Remind yourself of your decision to quit.  Change habits associated with smoking. If you smoked while driving or when feeling stressed, try other activities to replace smoking. Stand up when drinking your coffee. Brush your teeth after eating. Sit in a different  chair when you read the paper. Avoid alcohol while trying to quit, and try to drink fewer caffeinated beverages. Alcohol and caffeine may urge you to smoke.  Avoid foods and drinks that can trigger a desire to smoke, such as sugary or spicy foods and alcohol.  Ask people who smoke not to smoke around you.  Have something planned to do right after eating or having a cup of coffee. For example, plan to take a walk or exercise.  Try a relaxation exercise to calm you down and decrease your stress. Remember, you may be tense and nervous for the first 2 weeks after you quit, but this will pass.  Find new activities to keep your hands busy. Play with a pen, coin, or rubber band. Doodle or draw things on paper.  Brush your teeth right after eating. This will help cut down on the craving for the taste of tobacco after meals. You can also try mouthwash.   Use oral substitutes in place of cigarettes. Try using lemon drops, carrots, cinnamon sticks, or chewing gum. Keep them handy so they are available when you have the urge to smoke.  When you have the urge to smoke, try deep breathing.  Designate your home as a nonsmoking area.  If you are a heavy smoker, ask your health care provider about a prescription for nicotine chewing gum. It can ease your withdrawal from nicotine.  Reward yourself. Set aside the cigarette money you  save and buy yourself something nice.  Look for support from others. Join a support group or smoking cessation program. Ask someone at home or at work to help you with your plan to quit smoking.  Always ask yourself, "Do I need this cigarette or is this just a reflex?" Tell yourself, "Today, I choose not to smoke," or "I do not want to smoke." You are reminding yourself of your decision to quit.  Do not replace cigarette smoking with electronic cigarettes (commonly called e-cigarettes). The safety of e-cigarettes is unknown, and some may contain harmful chemicals.  If you  relapse, do not give up! Plan ahead and think about what you will do the next time you get the urge to smoke. HOW WILL I FEEL WHEN I QUIT SMOKING? You may have symptoms of withdrawal because your body is used to nicotine (the addictive substance in cigarettes). You may crave cigarettes, be irritable, feel very hungry, cough often, get headaches, or have difficulty concentrating. The withdrawal symptoms are only temporary. They are strongest when you first quit but will go away within 10-14 days. When withdrawal symptoms occur, stay in control. Think about your reasons for quitting. Remind yourself that these are signs that your body is healing and getting used to being without cigarettes. Remember that withdrawal symptoms are easier to treat than the major diseases that smoking can cause.  Even after the withdrawal is over, expect periodic urges to smoke. However, these cravings are generally short lived and will go away whether you smoke or not. Do not smoke! WHAT RESOURCES ARE AVAILABLE TO HELP ME QUIT SMOKING? Your health care provider can direct you to community resources or hospitals for support, which may include:  Group support.  Education.  Hypnosis.  Therapy.   This information is not intended to replace advice given to you by your health care provider. Make sure you discuss any questions you have with your health care provider.   Document Released: 12/09/2003 Document Revised: 04/02/2014 Document Reviewed: 08/28/2012 Elsevier Interactive Patient Education [employer].

## 2015-03-18 ENCOUNTER — Encounter: Payer: Self-pay | Admitting: Cardiovascular Disease

## 2015-03-18 ENCOUNTER — Ambulatory Visit (INDEPENDENT_AMBULATORY_CARE_PROVIDER_SITE_OTHER): Payer: Medicare Other | Admitting: Cardiovascular Disease

## 2015-03-18 VITALS — BP 108/78 | HR 90 | Ht 76.0 in | Wt 180.2 lb

## 2015-03-18 DIAGNOSIS — R079 Chest pain, unspecified: Secondary | ICD-10-CM | POA: Diagnosis not present

## 2015-03-18 DIAGNOSIS — K30 Functional dyspepsia: Secondary | ICD-10-CM

## 2015-03-18 DIAGNOSIS — I5022 Chronic systolic (congestive) heart failure: Secondary | ICD-10-CM | POA: Diagnosis not present

## 2015-03-18 DIAGNOSIS — R1013 Epigastric pain: Secondary | ICD-10-CM

## 2015-03-18 DIAGNOSIS — I159 Secondary hypertension, unspecified: Secondary | ICD-10-CM

## 2015-03-18 MED ORDER — SPIRONOLACTONE 25 MG PO TABS: 12.5000 mg | ORAL_TABLET | Freq: Every day | ORAL | Status: DC

## 2015-03-18 NOTE — Progress Notes (Signed)
HPI  This is a 59 year old male who is here today for a follow-up visit after regarding  chronic systolic heart failure. 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 His previous ejection fraction in June 2015 was 45-50%.   most recent echocardiogram in November 2016 showed worsening LV systolic dysfunction with an ejection fraction of 25-30%. Due to that, he underwent cardiac catheterization which showed mild nonobstructive one-vessel coronary artery disease. LVEDP was mildly elevated. He improved with diuresis. He quit alcohol completely since his hospital discharge.   he has been doing reasonably well overall with no reported chest pain. Shortness of breath is gradually improving .  He denies any orthopnea, PND or significant leg edema.  he is trying to cut down on smoking and currently he is smoking  light cigarettes.   No Known Allergies   Current Outpatient Prescriptions on File Prior to Visit  Medication Sig Dispense Refill  . albuterol (PROVENTIL HFA;VENTOLIN HFA) 108 (90 BASE) MCG/ACT inhaler Inhale 2 puffs into the lungs every 6 (six) hours as needed for wheezing or shortness of breath. Take 2 puffs every 5-10 minutes up to 6 puffs total over 15 minutes when needed.  Use with a spacer. 1 Inhaler 2  . aspirin EC 81 MG tablet Take 1 tablet (81 mg total) by mouth daily. 90 tablet 0  . atorvastatin (LIPITOR) 10 MG tablet Take 1 tablet (10 mg total) by mouth daily at 6 PM. (Patient taking differently: Take 10 mg by mouth at bedtime. ) 30 tablet 6  . carvedilol (COREG) 6.25 MG tablet Take 1 tablet (6.25 mg total) by mouth 2 (two) times daily with a meal. 60 tablet 5  . colchicine 0.6 MG tablet TAKE 1 TABLET BY MOUTH TWICE A DAY 60 tablet 2  . cyclobenzaprine (FLEXERIL) 10 MG tablet Take 1 tablet (10 mg total) by  mouth 3 (three) times daily as needed for muscle spasms. 30 tablet 0  . diphenoxylate-atropine (LOMOTIL) 2.5-0.025 MG per tablet Take 1 tablet by mouth 4 (four) times daily as needed for diarrhea or loose stools. 30 tablet 0  . etodolac (LODINE) 500 MG tablet Take 1 tablet (500 mg total) by mouth 2 (two) times daily. SCHEDULE FOLLOW UP OFFICE VISIT FOR MORE REFILLS [phone] (Patient taking differently: Take 500 mg by mouth daily. SCHEDULE FOLLOW UP OFFICE VISIT FOR MORE REFILLS [phone]) 60 tablet 0  . fenofibrate 54 MG tablet Take 1 tablet (54 mg total) by mouth daily. 30 tablet 6  . folic acid (FOLVITE) 1 MG tablet Take 1 tablet (1 mg total) by mouth daily. 30 tablet 6  . furosemide (LASIX) 40 MG tablet Take 1 tablet (40 mg total) by mouth daily. 30 tablet 0  . lisinopril (PRINIVIL,ZESTRIL) 10 MG tablet Take 1 tablet (10 mg total) by mouth daily. 30 tablet 0  . Multiple Vitamin (ONE-A-DAY MENS PO) Take 1 tablet by mouth daily.     . Omega-3 Fatty Acids (FISH OIL PO) Take 1 capsule by mouth daily.    [name] thiamine 100 MG tablet Take 1 tablet (100 mg total) by mouth daily. 30 tablet 0  . traMADol (ULTRAM) 50 MG tablet Take 1 tablet (50 mg total) by mouth every 8 (eight) hours as needed. 90 tablet 0   No current facility-administered medications on file prior to visit.     Past Medical History  Diagnosis  Date  . Chronic systolic CHF (congestive heart failure) ([place])     a. EF 20-25% by echo 12/28/10; b improved to 45-50% on echo in June 2015  . Nonischemic cardiomyopathy ([place])     minimal coronary disease by cath 12/29/10  . Hemoptysis     secondary to pulmonary edema  . Hyperlipidemia   . Chronic anemia   . GI bleed     15 years ago  . Tobacco abuse   . Alcohol abuse   . Osteoarthritis   . Gout   . Diabetes mellitus, type 2 ([place])     pt reports his DM is gone  . 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 Coronary artery disease     Cardiac catheterization in November 2016  showed mild 30% proximal LAD stenosis with no evidence of obstructive disease.  . 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 Cardiac catheterization    . Hip arthroplasty Right 03/15/2013    Procedure: ARTHROPLASTY BIPOLAR HIP;  Surgeon: [name], MD;  Location: [facility];  Service: Orthopedics;  Laterality: Right;  . Cardiac catheterization N/A 01/28/2015    Procedure: Left Heart Cath and Coronary Angiography;  Surgeon: Wellington Hampshire, MD;  Location: [facility];  Service: Cardiovascular;  Laterality: N/A;     Family History  Problem Relation Age of Onset  . Diabetes Mother   . Hypertension Mother   . Diabetes Father   . Hypertension Father   . Cancer Neg Hx   . Heart disease Neg Hx   . Stroke Neg Hx      Social History   Social History  . Marital Status: Single    Spouse Name: N/A  . Number of Children: N/A  . Years of Education: N/A   Occupational History  . Not on file.   Social History Main Topics  . Smoking status: Current Every Day Smoker -- 0.25 packs/day for 30 years    Types: Cigarettes  . Smokeless tobacco: Never Used  . Alcohol Use: 0.6 oz/week    1 Glasses of wine per week     Comment: occasionally  . Drug Use: Yes    Special: Marijuana     Comment: 1 blunt 1-2 times a month  . Sexual Activity: Yes   Other Topics Concern  . Not on file   Social History Narrative      PHYSICAL EXAM   BP 108/78 mmHg  Pulse 90  Ht 6\' 4"  (1.93 m)  Wt 180 lb 4 oz (81.761 kg)  BMI 21.95 kg/m2 Constitutional: He is oriented to person, place, and time. He appears well-developed and well-nourished. No distress.  HENT: No nasal discharge.  Head: Normocephalic and atraumatic.  Eyes: Pupils are equal and round.  No discharge. Neck: Normal range of motion. Neck supple. No JVD present. No thyromegaly present.  Cardiovascular: Normal rate, regular  rhythm, normal heart sounds. Exam reveals no gallop and no friction rub. No murmur heard.  Pulmonary/Chest: Effort normal and breath sounds normal. No stridor. No respiratory distress. He has no wheezes. He has no rales. He exhibits no tenderness.  Abdominal: Soft. Bowel sounds are normal. He exhibits no distension. There is no tenderness. There is no rebound and no guarding.  Musculoskeletal: Normal range of motion. He exhibits no edema and no tenderness.  Neurological: He is alert and oriented to person, place, and time. Coordination normal.  Skin: Skin is warm and dry. No rash noted. He is not diaphoretic. No erythema. No pallor.  Psychiatric: He has a normal mood and affect. His behavior is normal. Judgment and thought content normal.      EKG: Normal sinus rhythm with left ventricular hypertrophy with QRS widening and repolarization abnormalities   ASSESSMENT AND PLAN

## 2015-03-18 NOTE — Patient Instructions (Signed)
Medication Instructions:  Your physician has recommended you make the following change in your medication:  STOP taking indomethacin START taking spironolactone 12.5mg  daily   Labwork: BMET in one week  Testing/Procedures: none  Follow-Up: Your physician recommends that you schedule a follow-up appointment in: two months with Dr. Fletcher Anon.    Any Other Special Instructions Will Be Listed Below (If Applicable).     If you need a refill on your cardiac medications before your next appointment, please call your pharmacy.

## 2015-03-21 NOTE — Assessment & Plan Note (Signed)
The patient is improving clinically with medical therapy and currently he is in [place] class II. He stopped drinking alcohol completely. Continue treatment with lisinopril and carvedilol.  I asked him to avoid using all NSAIDs.  I added small dose spironolactone 12.5 mg once daily. Check basic metabolic profile in one week.  Repeat echocardiogram in few months to reevaluate LV systolic function  To see  If we should consider ICD placement.

## 2015-03-25 ENCOUNTER — Other Ambulatory Visit: Payer: Medicare Other

## 2015-03-29 ENCOUNTER — Other Ambulatory Visit: Payer: Self-pay | Admitting: Internal Medicine

## 2015-04-01 ENCOUNTER — Other Ambulatory Visit (INDEPENDENT_AMBULATORY_CARE_PROVIDER_SITE_OTHER): Payer: Medicare Other | Admitting: *Deleted

## 2015-04-01 DIAGNOSIS — I159 Secondary hypertension, unspecified: Secondary | ICD-10-CM | POA: Diagnosis not present

## 2015-04-02 LAB — BASIC METABOLIC PANEL
BUN/Creatinine Ratio: 12 (ref 9–20)
BUN: 15 mg/dL (ref 6–24)
CALCIUM: 9.3 mg/dL (ref 8.7–10.2)
CO2: 24 mmol/L (ref 18–29)
CREATININE: 1.23 mg/dL (ref 0.76–1.27)
Chloride: 100 mmol/L (ref 96–106)
GFR calc Af Amer: 74 mL/min/{1.73_m2} (ref >59)
GFR, EST NON AFRICAN AMERICAN: 64 mL/min/{1.73_m2} (ref >59)
GLUCOSE: 104 mg/dL — AB (ref 65–99)
Potassium: 4.3 mmol/L (ref 3.5–5.2)
SODIUM: 140 mmol/L (ref 134–144)

## 2015-04-17 ENCOUNTER — Emergency Department
Admission: EM | Admit: 2015-04-17 | Discharge: 2015-04-17 | Disposition: A | Discharge status: Home / Self Care | Payer: Medicare Other | Attending: Emergency Medicine | Admitting: Emergency Medicine

## 2015-04-17 ENCOUNTER — Emergency Department: Payer: Medicare Other

## 2015-04-17 ENCOUNTER — Encounter: Payer: Self-pay | Admitting: Emergency Medicine

## 2015-04-17 DIAGNOSIS — M47816 Spondylosis without myelopathy or radiculopathy, lumbar region: Secondary | ICD-10-CM

## 2015-04-17 DIAGNOSIS — E119 Type 2 diabetes mellitus without complications: Secondary | ICD-10-CM | POA: Insufficient documentation

## 2015-04-17 DIAGNOSIS — M545 Low back pain, unspecified: Secondary | ICD-10-CM

## 2015-04-17 DIAGNOSIS — M47817 Spondylosis without myelopathy or radiculopathy, lumbosacral region: Secondary | ICD-10-CM | POA: Diagnosis not present

## 2015-04-17 DIAGNOSIS — Z79899 Other long term (current) drug therapy: Secondary | ICD-10-CM | POA: Diagnosis not present

## 2015-04-17 DIAGNOSIS — M546 Pain in thoracic spine: Secondary | ICD-10-CM | POA: Diagnosis not present

## 2015-04-17 DIAGNOSIS — G8929 Other chronic pain: Secondary | ICD-10-CM | POA: Insufficient documentation

## 2015-04-17 DIAGNOSIS — Z7982 Long term (current) use of aspirin: Secondary | ICD-10-CM | POA: Insufficient documentation

## 2015-04-17 DIAGNOSIS — M47894 Other spondylosis, thoracic region: Secondary | ICD-10-CM | POA: Diagnosis not present

## 2015-04-17 DIAGNOSIS — F17210 Nicotine dependence, cigarettes, uncomplicated: Secondary | ICD-10-CM | POA: Diagnosis not present

## 2015-04-17 DIAGNOSIS — M47896 Other spondylosis, lumbar region: Secondary | ICD-10-CM | POA: Diagnosis not present

## 2015-04-17 DIAGNOSIS — M47814 Spondylosis without myelopathy or radiculopathy, thoracic region: Secondary | ICD-10-CM

## 2015-04-17 MED ORDER — METHOCARBAMOL 500 MG PO TABS
750.0000 mg | ORAL_TABLET | Freq: Once | ORAL | Status: AC
  Administered 2015-04-17: 750 mg via ORAL
  Filled 2015-04-17: qty 2

## 2015-04-17 MED ORDER — HYDROCODONE-ACETAMINOPHEN 5-325 MG PO TABS: 1.0000 | ORAL_TABLET | ORAL | Status: DC | PRN

## 2015-04-17 MED ORDER — HYDROCODONE-ACETAMINOPHEN 5-325 MG PO TABS
1.0000 | ORAL_TABLET | Freq: Once | ORAL | Status: AC
  Administered 2015-04-17: 1 via ORAL
  Filled 2015-04-17: qty 1

## 2015-04-17 MED ORDER — MELOXICAM 7.5 MG PO TABS
7.5000 mg | ORAL_TABLET | Freq: Every day | ORAL | Status: DC
Start: 2015-04-17 — End: 2015-05-04

## 2015-04-17 MED ORDER — METHOCARBAMOL 500 MG PO TABS: 500.0000 mg | ORAL_TABLET | Freq: Four times a day (QID) | ORAL | Status: DC

## 2015-04-17 NOTE — ED Notes (Signed)
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  C/o back pain starting yesterday but has gotten worse over night.  Worse when bending over and with movement.

## 2015-04-17 NOTE — ED Provider Notes (Signed)
[facility] Emergency Department Provider Note ____________________________________________  Time seen: Approximately 10:11 AM  I have reviewed the triage vital signs and the nursing notes.   HISTORY  Chief Complaint Back Pain   HPI Travis [name] is a 60 y.o. male is here with complaint of back pain starting yesterday but got worse during the night. Patient states that he awoke approximately 3 AM with pain worsening has not taken any over-the-counter medication for this. He states that he has had problems with his back off and on for years as he is a Horticulturist, commercial. He states he always comes to the emergency room for his medication and then goes to his doctor for Percocet. Currently complains of muscle spasms. He denies any urinary symptoms and there is been no history of a direct injury to his back.Movement increases his pain and currently he rates it as a 9 out of 10.   Past Medical History  Diagnosis Date  . Chronic systolic CHF (congestive heart failure) ([place])     a. EF 20-25% by echo 12/28/10; b improved to 45-50% on echo in June 2015  . Nonischemic cardiomyopathy ([place])     minimal coronary disease by cath 12/29/10  . Hemoptysis     secondary to pulmonary edema  . Hyperlipidemia   . Chronic anemia   . GI bleed     15 years ago  . Tobacco abuse   . Alcohol abuse   . Osteoarthritis   . Gout   . Diabetes mellitus, type 2 ([place])     pt reports his DM is gone  . 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 Coronary artery disease     Cardiac catheterization in November 2016 showed mild 30% proximal LAD stenosis with no evidence of obstructive disease.  . 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 Hypotension 02/16/2015  . Chest pain with moderate risk for cardiac etiology   . Sinusitis 10/27/2014  . Literacy level of illiterate 06/04/2014  .  Noncompliance 10/22/2013  . 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 Cardiomyopathy, nonischemic ([place]) 03/15/2013  . Smoker 03/14/2013  . Alcoholism in remission ([place]) 03/14/2013  . Chronic systolic heart failure ([place]) 01/05/2011  . DM2 (diabetes mellitus, type 2) ([place]) 11/30/2008  . HLD (hyperlipidemia) 11/30/2008  . Gout 11/30/2008  . Iron deficiency anemia, unspecified 11/30/2008    Past Surgical History  Procedure Laterality Date  . Cardiac catheterization    . Hip arthroplasty Right 03/15/2013    Procedure: ARTHROPLASTY BIPOLAR HIP;  Surgeon: [name], MD;  Location: [facility];  Service: Orthopedics;  Laterality: Right;  . Cardiac catheterization N/A 01/28/2015    Procedure: Left Heart Cath and Coronary Angiography;  Surgeon: Wellington Hampshire, MD;  Location: [facility];  Service: Cardiovascular;  Laterality: N/A;    Current Outpatient Rx  Name  Route  Sig  Dispense  Refill  . albuterol (PROVENTIL HFA;VENTOLIN HFA) 108 (90 BASE) MCG/ACT inhaler   Inhalation   Inhale 2 puffs into the lungs every 6 (six) hours as needed for wheezing or shortness of breath. Take 2 puffs every 5-10 minutes up to 6 puffs total over 15 minutes when needed.  Use with a spacer.   1 Inhaler   2   . allopurinol (ZYLOPRIM) 100 MG tablet      TAKE 1 TABLET BY MOUTH ONCE A DAY  30 tablet   4   . aspirin EC 81 MG tablet   Oral   Take 1 tablet (81 mg total) by mouth daily.   90 tablet   0   . atorvastatin (LIPITOR) 10 MG tablet   Oral   Take 1 tablet (10 mg total) by mouth daily at 6 PM. Patient taking differently: Take 10 mg by mouth at bedtime.    30 tablet   6   . carvedilol (COREG) 6.25 MG tablet   Oral   Take 1 tablet (6.25 mg total) by mouth 2 (two) times daily with a meal.   60 tablet   5   . colchicine 0.6 MG tablet      TAKE 1 TABLET BY MOUTH TWICE A DAY   60 tablet   2   . cyclobenzaprine (FLEXERIL) 10 MG tablet   Oral   Take 1 tablet (10  mg total) by mouth 3 (three) times daily as needed for muscle spasms.   30 tablet   0   . diphenoxylate-atropine (LOMOTIL) 2.5-0.025 MG per tablet   Oral   Take 1 tablet by mouth 4 (four) times daily as needed for diarrhea or loose stools.   30 tablet   0   . fenofibrate 54 MG tablet   Oral   Take 1 tablet (54 mg total) by mouth daily.   30 tablet   6   . folic acid (FOLVITE) 1 MG tablet   Oral   Take 1 tablet (1 mg total) by mouth daily.   30 tablet   6   . furosemide (LASIX) 40 MG tablet   Oral   Take 1 tablet (40 mg total) by mouth daily.   30 tablet   0   . HYDROcodone-acetaminophen (NORCO/VICODIN) 5-325 MG tablet   Oral   Take 1 tablet by mouth every 4 (four) hours as needed for moderate pain.   15 tablet   0   . lisinopril (PRINIVIL,ZESTRIL) 10 MG tablet   Oral   Take 1 tablet (10 mg total) by mouth daily.   30 tablet   0   . meloxicam (MOBIC) 7.5 MG tablet   Oral   Take 1 tablet (7.5 mg total) by mouth daily.   30 tablet   2   . methocarbamol (ROBAXIN) 500 MG tablet   Oral   Take 1 tablet (500 mg total) by mouth 4 (four) times daily.   20 tablet   0   . Multiple Vitamin (ONE-A-DAY MENS PO)   Oral   Take 1 tablet by mouth daily.          . Omega-3 Fatty Acids (FISH OIL PO)   Oral   Take 1 capsule by mouth daily.         [name] spironolactone (ALDACTONE) 25 MG tablet   Oral   Take 0.5 tablets (12.5 mg total) by mouth daily.   30 tablet   3   . thiamine 100 MG tablet   Oral   Take 1 tablet (100 mg total) by mouth daily.   30 tablet   0     Allergies Review of patient's allergies indicates no known allergies.  Family History  Problem Relation Age of Onset  . Diabetes Mother   . Hypertension Mother   . Diabetes Father   . Hypertension Father   . Cancer Neg Hx   . Heart disease Neg Hx   . Stroke Neg Hx     Social History  Social History  Substance Use Topics  . Smoking status: Current Every Day Smoker -- 0.25 packs/day for 30  years    Types: Cigarettes  . Smokeless tobacco: Never Used  . Alcohol Use: 0.6 oz/week    1 Glasses of wine per week     Comment: occasionally    Review of Systems Constitutional: No fever/chills Cardiovascular: Denies chest pain. Respiratory: Denies shortness of breath. Gastrointestinal: No abdominal pain.  No nausea, no vomiting.  No diarrhea.  No constipation. Genitourinary: Negative for dysuria. Musculoskeletal: Positive for chronic back pain Skin: Negative for rash. Neurological: Negative for headaches, focal weakness or numbness.  10-point ROS otherwise negative.  ____________________________________________   PHYSICAL EXAM:  VITAL SIGNS: ED Triage Vitals  Enc Vitals Group     BP 04/17/15 0933 141/86 mmHg     Pulse Rate 04/17/15 0933 92     Resp 04/17/15 0933 18     Temp 04/17/15 0933 98.4 F (36.9 C)     Temp src --      SpO2 04/17/15 0933 99 %     Weight 04/17/15 0933 180 lb (81.647 kg)     Height 04/17/15 0933 6\' 4"  (1.93 m)     Head Cir --      Peak Flow --      Pain Score 04/17/15 0937 9     Pain Loc --      Pain Edu? --      Excl. in [place]? --     Constitutional: Alert and oriented. Well appearing and in no acute distress. Eyes: Conjunctivae are normal. PERRL. EOMI. Head: Atraumatic. Nose: No congestion/rhinnorhea. Neck: No stridor.  Supple Cardiovascular: Normal rate, regular rhythm. Grossly normal heart sounds.  Good peripheral circulation. Respiratory: Normal respiratory effort.  No retractions. Lungs CTAB. Gastrointestinal: Soft and nontender. No distention.  Musculoskeletal: No gross deformity was noted of the thoracic or lumbar spine. There is however restriction with range of motion secondary muscle spasms. There is moderate tenderness on palpation of both thoracic and lumbar spine and paravertebral muscles. Normal gait was noted. Neurologic:  Normal speech and language. No gross focal neurologic deficits are appreciated. No gait instability.  Reflexes 2+ bilaterally. Skin:  Skin is warm, dry and intact. No rash noted. Psychiatric: Mood and affect are normal. Speech and behavior are normal.  ____________________________________________   LABS (all labs ordered are listed, but only abnormal results are displayed)  Labs Reviewed - No data to display   RADIOLOGY  Thoracic spine per radiologist shows mild multilevel osteoarthritis changes. Lumbar spine per radiologist shows stable moderate lumbar spondylosis at L3-L4, L4-L5, and L5-S1 area ____________________________________________   PROCEDURES  Procedure(s) performed: None  Critical Care performed: No  ____________________________________________   INITIAL IMPRESSION / ASSESSMENT AND PLAN / ED COURSE  Pertinent labs & imaging results that were available during my care of the patient were reviewed by me and considered in my medical decision making (see chart for details).  Patient was placed in medics 7.5 daily with food. Associated prescription for Norco as needed for severe pain and Robaxin for muscle spasms. He is follow-up with his primary care doctor at [facility]. ____________________________________________   FINAL CLINICAL IMPRESSION(S) / ED DIAGNOSES  Final diagnoses:  Acute lumbar back pain  Acute thoracic back pain  Degenerative arthritis of thoracic spine  Spondylosis of lumbar region without myelopathy or radiculopathy      Johnn Hai, PA-C 04/17/15 1244  Lavonia Drafts, MD 04/17/15 [phone]

## 2015-04-17 NOTE — Discharge Instructions (Signed)

## 2015-04-18 ENCOUNTER — Telehealth: Payer: Self-pay | Admitting: Internal Medicine

## 2015-04-18 ENCOUNTER — Encounter: Payer: Self-pay | Admitting: Family

## 2015-04-18 ENCOUNTER — Ambulatory Visit: Payer: Medicare Other | Attending: Family | Admitting: Family

## 2015-04-18 VITALS — BP 146/85 | HR 88 | Resp 20 | Ht 76.0 in | Wt 180.0 lb

## 2015-04-18 DIAGNOSIS — D649 Anemia, unspecified: Secondary | ICD-10-CM | POA: Insufficient documentation

## 2015-04-18 DIAGNOSIS — I429 Cardiomyopathy, unspecified: Secondary | ICD-10-CM | POA: Insufficient documentation

## 2015-04-18 DIAGNOSIS — Z79899 Other long term (current) drug therapy: Secondary | ICD-10-CM | POA: Diagnosis not present

## 2015-04-18 DIAGNOSIS — E119 Type 2 diabetes mellitus without complications: Secondary | ICD-10-CM | POA: Insufficient documentation

## 2015-04-18 DIAGNOSIS — I5022 Chronic systolic (congestive) heart failure: Secondary | ICD-10-CM | POA: Diagnosis not present

## 2015-04-18 DIAGNOSIS — I2510 Atherosclerotic heart disease of native coronary artery without angina pectoris: Secondary | ICD-10-CM | POA: Diagnosis not present

## 2015-04-18 DIAGNOSIS — Z7982 Long term (current) use of aspirin: Secondary | ICD-10-CM | POA: Insufficient documentation

## 2015-04-18 DIAGNOSIS — E785 Hyperlipidemia, unspecified: Secondary | ICD-10-CM | POA: Diagnosis not present

## 2015-04-18 DIAGNOSIS — I10 Essential (primary) hypertension: Secondary | ICD-10-CM | POA: Insufficient documentation

## 2015-04-18 DIAGNOSIS — I120 Hypertensive chronic kidney disease with stage 5 chronic kidney disease or end stage renal disease: Secondary | ICD-10-CM | POA: Insufficient documentation

## 2015-04-18 DIAGNOSIS — F17200 Nicotine dependence, unspecified, uncomplicated: Secondary | ICD-10-CM

## 2015-04-18 DIAGNOSIS — I426 Alcoholic cardiomyopathy: Secondary | ICD-10-CM | POA: Insufficient documentation

## 2015-04-18 DIAGNOSIS — M47814 Spondylosis without myelopathy or radiculopathy, thoracic region: Secondary | ICD-10-CM | POA: Insufficient documentation

## 2015-04-18 DIAGNOSIS — F17210 Nicotine dependence, cigarettes, uncomplicated: Secondary | ICD-10-CM | POA: Diagnosis not present

## 2015-04-18 DIAGNOSIS — M109 Gout, unspecified: Secondary | ICD-10-CM | POA: Diagnosis not present

## 2015-04-18 DIAGNOSIS — M4694 Unspecified inflammatory spondylopathy, thoracic region: Secondary | ICD-10-CM | POA: Insufficient documentation

## 2015-04-18 MED ORDER — SACUBITRIL-VALSARTAN 24-26 MG PO TABS: 1.0000 | ORAL_TABLET | Freq: Two times a day (BID) | ORAL | Status: DC

## 2015-04-18 NOTE — Patient Instructions (Signed)
Continue weighing daily and call for an overnight weight gain of > 2 pounds or a weekly weight gain of >5 pounds. 

## 2015-04-18 NOTE — Telephone Encounter (Signed)
Pt called. Was seen in ER 04/17/15. Wants to know if he needs to follow up with you?  Please advise

## 2015-04-18 NOTE — Progress Notes (Signed)
Subjective:    Patient ID: Travis [name], male    DOB: 04-29-1955, 60 y.o.   MRN: [id number]  Congestive Heart Failure Presents for follow-up visit. The disease course has been stable. Associated symptoms include fatigue (mild ) and palpitations (with pepperoni). Pertinent negatives include no abdominal pain, chest pain, edema, orthopnea or shortness of breath. The symptoms have been stable. Past treatments include beta blockers and salt and fluid restriction. The treatment provided moderate relief. Compliance with prior treatments has been good. His past medical history is significant for anemia, CAD and DM. Compliance with total regimen is 76-100%.  Hypertension This is a recurrent problem. The current episode started more than 1 year ago. The problem has been waxing and waning since onset. Associated symptoms include headaches (yesterday) and palpitations (with pepperoni). Pertinent negatives include no chest pain, neck pain, peripheral edema or shortness of breath. There are no associated agents to hypertension. Risk factors for coronary artery disease include diabetes mellitus, dyslipidemia, family history, male gender and smoking/tobacco exposure. Past treatments include beta blockers, ACE inhibitors, diuretics and lifestyle changes. The current treatment provides moderate improvement. Compliance problems include psychosocial issues.  Hypertensive end-organ damage includes heart failure.   Past Medical History  Diagnosis Date  . Chronic systolic CHF (congestive heart failure) ([place])     a. EF 20-25% by echo 12/28/10; b improved to 45-50% on echo in June 2015  . Nonischemic cardiomyopathy ([place])     minimal coronary disease by cath 12/29/10  . Hemoptysis     secondary to pulmonary edema  . Hyperlipidemia   . Chronic anemia   . GI bleed     15 years ago  . Tobacco abuse   . Alcohol abuse   . Osteoarthritis   . Gout   . Diabetes mellitus, type 2 ([place])     pt reports his DM is gone   . 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 Coronary artery disease     Cardiac catheterization in November 2016 showed mild 30% proximal LAD stenosis with no evidence of obstructive disease.  . 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 Cardiac catheterization    . Hip arthroplasty Right 03/15/2013    Procedure: ARTHROPLASTY BIPOLAR HIP;  Surgeon: [name], MD;  Location: [facility];  Service: Orthopedics;  Laterality: Right;  . Cardiac catheterization N/A 01/28/2015    Procedure: Left Heart Cath and Coronary Angiography;  Surgeon: Wellington Hampshire, MD;  Location: [facility];  Service: Cardiovascular;  Laterality: N/A;    Family History  Problem Relation Age of Onset  . Diabetes Mother   . Hypertension Mother   . Diabetes Father   . Hypertension Father   . Cancer Neg Hx   . Heart disease Neg Hx   . Stroke Neg Hx     Social History  Substance Use Topics  . Smoking status: Current Every Day Smoker -- 0.25 packs/day for 30 years    Types: Cigarettes  . Smokeless tobacco: Never Used  . Alcohol Use: 0.6 oz/week    1 Glasses of wine per week     Comment: occasionally    No Known Allergies  Prior to Admission medications   Medication Sig Start Date End Date Taking? Authorizing Provider  albuterol (PROVENTIL HFA;VENTOLIN HFA) 108 (90 BASE) MCG/ACT inhaler Inhale 2 puffs into the lungs every 6 (six) hours as needed for wheezing or  shortness of breath. Take 2 puffs every 5-10 minutes up to 6 puffs total over 15 minutes when needed.  Use with a spacer. 01/24/15  Yes Ahmed Prima, MD  aspirin EC 81 MG tablet Take 1 tablet (81 mg total) by mouth daily. 02/04/15  Yes Wellington Hampshire, MD  carvedilol (COREG) 6.25 MG tablet Take 1 tablet (6.25 mg total) by mouth 2 (two) times daily with a meal. 02/04/15  Yes Wellington Hampshire, MD  fenofibrate 54 MG tablet Take 1 tablet  (54 mg total) by mouth daily. 12/28/14  Yes Amy D Clegg, NP  folic acid (FOLVITE) 1 MG tablet Take 1 tablet (1 mg total) by mouth daily. 12/28/14  Yes Amy D Clegg, NP  HYDROcodone-acetaminophen (NORCO/VICODIN) 5-325 MG tablet Take 1 tablet by mouth every 4 (four) hours as needed for moderate pain. 04/17/15  Yes Johnn Hai, PA-C  meloxicam (MOBIC) 7.5 MG tablet Take 1 tablet (7.5 mg total) by mouth daily. 04/17/15 04/16/16 Yes Johnn Hai, PA-C  methocarbamol (ROBAXIN) 500 MG tablet Take 1 tablet (500 mg total) by mouth 4 (four) times daily. 04/17/15  Yes Johnn Hai, PA-C  Multiple Vitamin (ONE-A-DAY MENS PO) Take 1 tablet by mouth daily.    Yes Historical Provider, MD  Omega-3 Fatty Acids (FISH OIL PO) Take 1 capsule by mouth daily.   Yes Historical Provider, MD  spironolactone (ALDACTONE) 25 MG tablet Take 0.5 tablets (12.5 mg total) by mouth daily. 03/18/15  Yes Wellington Hampshire, MD  thiamine 100 MG tablet Take 1 tablet (100 mg total) by mouth daily. 09/16/13  Yes Thurnell Lose, MD  sacubitril-valsartan (ENTRESTO) 24-26 MG Take 1 tablet by mouth 2 (two) times daily. 04/18/15   Alisa Graff, FNP      Review of Systems  Constitutional: Positive for fatigue (mild ). Negative for appetite change.  HENT: Positive for congestion and postnasal drip. Negative for sore throat.   Eyes: Negative.   Respiratory: Positive for cough (little bit). Negative for chest tightness, shortness of breath and wheezing.   Cardiovascular: Positive for palpitations (with pepperoni). Negative for chest pain and leg swelling.  Gastrointestinal: Negative for abdominal pain and abdominal distention.  Endocrine: Negative for heat intolerance.       Night sweats on occasion   Genitourinary: Negative.   Musculoskeletal: Positive for back pain. Negative for neck pain.  Skin: Negative.   Allergic/Immunologic: Negative.   Neurological: Positive for headaches (yesterday). Negative for dizziness and  light-headedness.  Hematological: Negative for adenopathy. Does not bruise/bleed easily.  Psychiatric/Behavioral: Negative for sleep disturbance (sleeping on 2 pillows; having night sweats on occasion) and dysphoric mood. The patient is not nervous/anxious.        Objective:   Physical Exam  Constitutional: He is oriented to person, place, and time. He appears well-developed and well-nourished.  HENT:  Head: Normocephalic and atraumatic.  Eyes: Conjunctivae are normal. Pupils are equal, round, and reactive to light.  Neck: Normal range of motion. Neck supple.  Cardiovascular: Normal rate and regular rhythm.   Pulmonary/Chest: Effort normal. He has no wheezes. He has no rales.  Abdominal: Soft. He exhibits no distension. There is no tenderness.  Musculoskeletal: He exhibits no edema or tenderness.  Neurological: He is alert and oriented to person, place, and time.  Skin: Skin is warm and dry.  Psychiatric: He has a normal mood and affect. His behavior is normal. Thought content normal.  Nursing note and vitals reviewed.   BP 146/85  mmHg  Pulse 88  Resp 20  Ht 6\' 4"  (1.93 m)  Wt 180 lb (81.647 kg)  BMI 21.92 kg/m2  SpO2 100%       Assessment & Plan:  1: Chronic heart failure with reduced ejection fraction- Patient presents with a mild amount of fatigue (Class II) with moderate exertion. He says that he didn't have any symptoms walking into the office. When he does get tired, he will stop what he's doing to rest until his energy level improves which he says occurs fairly quickly. Denies any shortness of breath or edema. He continues to weigh himself and says that his home weight has been stable. By our scale, his weight hasn't changed since he was here last time. Reminded to call for an overnight weight gain of >2 pounds or a weekly weight gain of >5 pounds. He is not adding salt to his food and is using Mrs. Dash seasoning on his food and is trying to follow a low sodium diet. He did  not bring his medications nor a list with him so his pharmacy was called to get a list faxed to [place]. Patient hasn't filled lisinopril since Nov 2016 and he denies going to any other pharmacy. Will begin entresto 24/26mg  twice daily and patient was given a 30 day free voucher to pick up the medication. Will plan on doing labs at his next visit. Discussed titrating up his carvedilol, entrestro and/or adding bidil at his next office visit. Sees cardiology Feb 2017. 2: HTN- Blood pressure looks good today. Continue medications at this time. 3: Degenerative arthritis of thoracic spine- He went to the ER on 04/17/15 due to worsening back pain. He was given narcotic, antiinflammatory and muscle relaxant. Discussed possible drowsiness with medications. He says that he's going to call his PCP about a referral to pain management. 4: Tobacco use- He says that he's still smoking but is smoking "light" cigarettes and smokes 1/2 ppd. Is working on decreasing his usage. Complete cessation was discussed for 3 minutes with him. Denies alcohol use.  Return here in 1 month or sooner for any questions/problems before then.

## 2015-04-18 NOTE — Telephone Encounter (Signed)
Not unless he seems worse.

## 2015-04-19 NOTE — Telephone Encounter (Signed)
Pt advised and verbalized understanding.

## 2015-05-04 ENCOUNTER — Emergency Department
Admission: EM | Admit: 2015-05-04 | Discharge: 2015-05-04 | Disposition: A | Discharge status: Home / Self Care | Payer: Medicare Other | Attending: Emergency Medicine | Admitting: Emergency Medicine

## 2015-05-04 ENCOUNTER — Emergency Department: Payer: Medicare Other

## 2015-05-04 ENCOUNTER — Encounter: Payer: Self-pay | Admitting: Emergency Medicine

## 2015-05-04 DIAGNOSIS — Z791 Long term (current) use of non-steroidal anti-inflammatories (NSAID): Secondary | ICD-10-CM | POA: Insufficient documentation

## 2015-05-04 DIAGNOSIS — F17210 Nicotine dependence, cigarettes, uncomplicated: Secondary | ICD-10-CM | POA: Insufficient documentation

## 2015-05-04 DIAGNOSIS — M7989 Other specified soft tissue disorders: Secondary | ICD-10-CM | POA: Diagnosis not present

## 2015-05-04 DIAGNOSIS — E119 Type 2 diabetes mellitus without complications: Secondary | ICD-10-CM | POA: Insufficient documentation

## 2015-05-04 DIAGNOSIS — I10 Essential (primary) hypertension: Secondary | ICD-10-CM | POA: Diagnosis not present

## 2015-05-04 DIAGNOSIS — M79641 Pain in right hand: Secondary | ICD-10-CM | POA: Insufficient documentation

## 2015-05-04 DIAGNOSIS — Z7982 Long term (current) use of aspirin: Secondary | ICD-10-CM | POA: Insufficient documentation

## 2015-05-04 DIAGNOSIS — M109 Gout, unspecified: Secondary | ICD-10-CM | POA: Insufficient documentation

## 2015-05-04 DIAGNOSIS — G8929 Other chronic pain: Secondary | ICD-10-CM | POA: Insufficient documentation

## 2015-05-04 DIAGNOSIS — Z79899 Other long term (current) drug therapy: Secondary | ICD-10-CM | POA: Diagnosis not present

## 2015-05-04 MED ORDER — TRAMADOL HCL 50 MG PO TABS: 50.0000 mg | ORAL_TABLET | Freq: Four times a day (QID) | ORAL | Status: DC | PRN

## 2015-05-04 MED ORDER — NAPROXEN 500 MG PO TABS: 500.0000 mg | ORAL_TABLET | Freq: Two times a day (BID) | ORAL | Status: DC

## 2015-05-04 MED ORDER — HYDROCODONE-ACETAMINOPHEN 5-325 MG PO TABS
2.0000 | ORAL_TABLET | Freq: Once | ORAL | Status: AC
  Administered 2015-05-04: 2 via ORAL
  Filled 2015-05-04: qty 2

## 2015-05-04 NOTE — Discharge Instructions (Signed)
Follow-up with your regular medical doctor for your hand pain or keep your appointment with Dr. Mack Guise. Tramadol as needed for pain. Naproxen twice a day with food.

## 2015-05-04 NOTE — ED Notes (Signed)
Pt with right hand swelling, history of gout.

## 2015-05-04 NOTE — ED Notes (Signed)
States he developed some pain with swelling to right hand about 3-4 days ago.pain in the  became worse last pm  Denies any injury  But has a hx of gout

## 2015-05-04 NOTE — ED Provider Notes (Signed)
[facility] Emergency Department Provider Note  ____________________________________________  Time seen: Approximately 9:59 AM  I have reviewed the triage vital signs and the nursing notes.   HISTORY  Chief Complaint Hand Pain   HPI Travis [name] is a 60 y.o. male is here complaining of right hand hurting for approximately 3-4 days. Patient states became worse last evening. Patient denies any head injury. He states he has a history of gout but has never had any in his right hand. He states he has been taking Tylenol without any relief of his pain. He denies any fever or chills, no nausea or vomiting. Patient's primary care doctor is at [facility]. Currently he rates his pain as a 4 out of 10.   Past Medical History  Diagnosis Date  . Chronic systolic CHF (congestive heart failure) ([place])     a. EF 20-25% by echo 12/28/10; b improved to 45-50% on echo in June 2015  . Nonischemic cardiomyopathy ([place])     minimal coronary disease by cath 12/29/10  . Hemoptysis     secondary to pulmonary edema  . Hyperlipidemia   . Chronic anemia   . GI bleed     15 years ago  . Tobacco abuse   . Alcohol abuse   . Osteoarthritis   . Gout   . Diabetes mellitus, type 2 ([place])     pt reports his DM is gone  . 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 Coronary artery disease     Cardiac catheterization in November 2016 showed mild 30% proximal LAD stenosis with no evidence of obstructive disease.  . 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 Essential hypertension 04/18/2015  . 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 Hypotension 02/16/2015  . Chest pain with moderate risk for cardiac etiology   . Sinusitis 10/27/2014  . Literacy level of illiterate 06/04/2014  . Noncompliance 10/22/2013  . 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  Cardiomyopathy, nonischemic ([place]) 03/15/2013  . Smoker 03/14/2013  . Alcoholism in remission ([place]) 03/14/2013  . Chronic systolic heart failure ([place]) 01/05/2011  . DM2 (diabetes mellitus, type 2) ([place]) 11/30/2008  . HLD (hyperlipidemia) 11/30/2008  . Gout 11/30/2008  . Iron deficiency anemia, unspecified 11/30/2008    Past Surgical History  Procedure Laterality Date  . Cardiac catheterization    . Hip arthroplasty Right 03/15/2013    Procedure: ARTHROPLASTY BIPOLAR HIP;  Surgeon: [name], MD;  Location: [facility];  Service: Orthopedics;  Laterality: Right;  . Cardiac catheterization N/A 01/28/2015    Procedure: Left Heart Cath and Coronary Angiography;  Surgeon: Wellington Hampshire, MD;  Location: [facility];  Service: Cardiovascular;  Laterality: N/A;    Current Outpatient Rx  Name  Route  Sig  Dispense  Refill  . albuterol (PROVENTIL HFA;VENTOLIN HFA) 108 (90 BASE) MCG/ACT inhaler   Inhalation   Inhale 2 puffs into the lungs every 6 (six) hours as needed for wheezing or shortness of breath. Take 2 puffs every 5-10 minutes up to 6 puffs total over 15 minutes when needed.  Use with a spacer.   1 Inhaler   2   . aspirin EC 81 MG tablet   Oral   Take 1 tablet (81 mg total) by mouth daily.   90 tablet   0   . carvedilol (COREG) 6.25 MG tablet  Oral   Take 1 tablet (6.25 mg total) by mouth 2 (two) times daily with a meal.   60 tablet   5   . fenofibrate 54 MG tablet   Oral   Take 1 tablet (54 mg total) by mouth daily.   30 tablet   6   . folic acid (FOLVITE) 1 MG tablet   Oral   Take 1 tablet (1 mg total) by mouth daily.   30 tablet   6   . Multiple Vitamin (ONE-A-DAY MENS PO)   Oral   Take 1 tablet by mouth daily.          . naproxen (NAPROSYN) 500 MG tablet   Oral   Take 1 tablet (500 mg total) by mouth 2 (two) times daily with a meal.   30 tablet   0   . Omega-3 Fatty Acids (FISH OIL PO)   Oral   Take 1 capsule by mouth daily.          . sacubitril-valsartan (ENTRESTO) 24-26 MG   Oral   Take 1 tablet by mouth 2 (two) times daily.   60 tablet   5   . spironolactone (ALDACTONE) 25 MG tablet   Oral   Take 0.5 tablets (12.5 mg total) by mouth daily.   30 tablet   3   . traMADol (ULTRAM) 50 MG tablet   Oral   Take 1 tablet (50 mg total) by mouth every 6 (six) hours as needed.   20 tablet   0     Allergies Review of patient's allergies indicates no known allergies.  Family History  Problem Relation Age of Onset  . Diabetes Mother   . Hypertension Mother   . Diabetes Father   . Hypertension Father   . Cancer Neg Hx   . Heart disease Neg Hx   . Stroke Neg Hx     Social History Social History  Substance Use Topics  . Smoking status: Current Every Day Smoker -- 0.25 packs/day for 30 years    Types: Cigarettes  . Smokeless tobacco: Never Used  . Alcohol Use: 0.6 oz/week    1 Glasses of wine per week     Comment: occasionally    Review of Systems Constitutional: No fever/chills Cardiovascular: Denies chest pain. Respiratory: Denies shortness of breath. Gastrointestinal:   No nausea, no vomiting.  Musculoskeletal: Negative for back pain. Skin: Negative for rash. Neurological: Negative for headaches, focal weakness or numbness.  10-point ROS otherwise negative.  ____________________________________________   PHYSICAL EXAM:  VITAL SIGNS: ED Triage Vitals  Enc Vitals Group     BP 05/04/15 0849 131/84 mmHg     Pulse Rate 05/04/15 0849 97     Resp 05/04/15 0849 18     Temp 05/04/15 0849 98 F (36.7 C)     Temp Source 05/04/15 0849 Oral     SpO2 05/04/15 0849 96 %     Weight 05/04/15 0849 180 lb (81.647 kg)     Height 05/04/15 0849 6\' 4"  (1.93 m)     Head Cir --      Peak Flow --      Pain Score 05/04/15 0848 4     Pain Loc --      Pain Edu? --      Excl. in [place]? --     Constitutional: Alert and oriented. Well appearing and in no acute distress. Eyes: Conjunctivae are normal.  PERRL. EOMI. Head: Atraumatic. Nose: No congestion/rhinnorhea. Neck: No stridor.  Cardiovascular: Normal rate, regular rhythm. Grossly normal heart sounds.  Good peripheral circulation. Respiratory: Normal respiratory effort.  No retractions. Lungs CTAB. Gastrointestinal: Soft and nontender. No distention. Musculoskeletal: No lower extremity tenderness nor edema.  No joint effusions. Neurologic:  Normal speech and language. No gross focal neurologic deficits are appreciated. No gait instability. Skin:  Skin is warm, dry and intact. No rash noted. Psychiatric: Mood and affect are normal. Speech and behavior are normal.  ____________________________________________   LABS (all labs ordered are listed, but only abnormal results are displayed)  Labs Reviewed - No data to display RADIOLOGY  X-ray of the right hand per radiologist shows multifocal arthropathy  ____________________________________________   PROCEDURES  Procedure(s) performed: None  Critical Care performed: No  ____________________________________________   INITIAL IMPRESSION / ASSESSMENT AND PLAN / ED COURSE  Pertinent labs & imaging results that were available during my care of the patient were reviewed by me and considered in my medical decision making (see chart for details).  Patient was given 2 Norco while in the emergency room. There is a note on his notes from his primary care doctor that he will not be given any continued narcotics. Patient has been seeing [facility] and has an appointment next month with him. Patient was given tramadol as needed for pain and naproxen 5 mg twice a day with food. This provider is also seen this patient on a few occasions and right hand is chronically inflamed at the MP joint. ____________________________________________   FINAL CLINICAL IMPRESSION(S) / ED DIAGNOSES  Final diagnoses:  Right hand pain  Acute on chronic hand pain    Johnn Hai, PA-C 05/04/15 1103  Johnn Hai, PA-C 05/04/15 [place], MD 05/04/15 1116

## 2015-05-12 ENCOUNTER — Emergency Department: Payer: Medicare Other

## 2015-05-12 ENCOUNTER — Emergency Department
Admission: EM | Admit: 2015-05-12 | Discharge: 2015-05-12 | Disposition: A | Discharge status: Home / Self Care | Payer: Medicare Other | Attending: Emergency Medicine | Admitting: Emergency Medicine

## 2015-05-12 ENCOUNTER — Encounter: Payer: Self-pay | Admitting: Emergency Medicine

## 2015-05-12 DIAGNOSIS — Z79899 Other long term (current) drug therapy: Secondary | ICD-10-CM | POA: Insufficient documentation

## 2015-05-12 DIAGNOSIS — F17210 Nicotine dependence, cigarettes, uncomplicated: Secondary | ICD-10-CM | POA: Diagnosis not present

## 2015-05-12 DIAGNOSIS — Z791 Long term (current) use of non-steroidal anti-inflammatories (NSAID): Secondary | ICD-10-CM | POA: Diagnosis not present

## 2015-05-12 DIAGNOSIS — J069 Acute upper respiratory infection, unspecified: Secondary | ICD-10-CM

## 2015-05-12 DIAGNOSIS — I10 Essential (primary) hypertension: Secondary | ICD-10-CM | POA: Diagnosis not present

## 2015-05-12 DIAGNOSIS — R509 Fever, unspecified: Secondary | ICD-10-CM | POA: Diagnosis not present

## 2015-05-12 DIAGNOSIS — Z7982 Long term (current) use of aspirin: Secondary | ICD-10-CM | POA: Diagnosis not present

## 2015-05-12 DIAGNOSIS — E119 Type 2 diabetes mellitus without complications: Secondary | ICD-10-CM | POA: Insufficient documentation

## 2015-05-12 DIAGNOSIS — R05 Cough: Secondary | ICD-10-CM | POA: Diagnosis present

## 2015-05-12 LAB — RAPID INFLUENZA A&B ANTIGENS (ARMC ONLY): INFLUENZA B (ARMC): NEGATIVE

## 2015-05-12 LAB — RAPID INFLUENZA A&B ANTIGENS: Influenza A (ARMC): NEGATIVE

## 2015-05-12 MED ORDER — BENZONATATE 100 MG PO CAPS: 200.0000 mg | ORAL_CAPSULE | Freq: Three times a day (TID) | ORAL | Status: DC | PRN

## 2015-05-12 NOTE — ED Provider Notes (Signed)
[facility] Emergency Department Provider Note  ____________________________________________  Time seen: Approximately 8:21 AM  I have reviewed the triage vital signs and the nursing notes.   HISTORY  Chief Complaint Cough  HPI Travis [name] is a 60 y.o. male is here with complaint of sudden onset fever and chills yesterday. Patient developed a nonproductive cough. Patient currently is not taking any over-the-counter medication for this. He states he did get a flu shot this year. He denies any nausea, vomiting, or diarrhea. Patient continues to eat and drink his normal meals. He also is continuing to smoke but states he has cut down to half pack.Patient rates his pain is an 8 out of 10.   Past Medical History  Diagnosis Date  . Chronic systolic CHF (congestive heart failure) ([place])     a. EF 20-25% by echo 12/28/10; b improved to 45-50% on echo in June 2015  . Nonischemic cardiomyopathy ([place])     minimal coronary disease by cath 12/29/10  . Hemoptysis     secondary to pulmonary edema  . Hyperlipidemia   . Chronic anemia   . GI bleed     15 years ago  . Tobacco abuse   . Alcohol abuse   . Osteoarthritis   . Gout   . Diabetes mellitus, type 2 ([place])     pt reports his DM is gone  . 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 Coronary artery disease     Cardiac catheterization in November 2016 showed mild 30% proximal LAD stenosis with no evidence of obstructive disease.  . 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 Essential hypertension 04/18/2015  . 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 Hypotension 02/16/2015  . Chest pain with moderate risk for cardiac etiology   . Sinusitis 10/27/2014  . Literacy level of illiterate 06/04/2014  . Noncompliance 10/22/2013  . 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 Cardiomyopathy,  nonischemic ([place]) 03/15/2013  . Smoker 03/14/2013  . Alcoholism in remission ([place]) 03/14/2013  . Chronic systolic heart failure ([place]) 01/05/2011  . DM2 (diabetes mellitus, type 2) ([place]) 11/30/2008  . HLD (hyperlipidemia) 11/30/2008  . Gout 11/30/2008  . Iron deficiency anemia, unspecified 11/30/2008    Past Surgical History  Procedure Laterality Date  . Cardiac catheterization    . Hip arthroplasty Right 03/15/2013    Procedure: ARTHROPLASTY BIPOLAR HIP;  Surgeon: [name], MD;  Location: [facility];  Service: Orthopedics;  Laterality: Right;  . Cardiac catheterization N/A 01/28/2015    Procedure: Left Heart Cath and Coronary Angiography;  Surgeon: Wellington Hampshire, MD;  Location: [place] [facility];  Service: Cardiovascular;  Laterality: N/A;    Current Outpatient Rx  Name  Route  Sig  Dispense  Refill  . albuterol (PROVENTIL HFA;VENTOLIN HFA) 108 (90 BASE) MCG/ACT inhaler   Inhalation   Inhale 2 puffs into the lungs every 6 (six) hours as needed for wheezing or shortness of breath. Take 2 puffs every 5-10 minutes up to 6 puffs total over 15 minutes when needed.  Use with a spacer.   1 Inhaler   2   . aspirin EC 81 MG tablet   Oral   Take 1 tablet (81 mg total) by mouth daily.   90 tablet   0   . benzonatate (TESSALON PERLES) 100 MG capsule   Oral   Take 2  capsules (200 mg total) by mouth 3 (three) times daily as needed for cough.   40 capsule   0   . carvedilol (COREG) 6.25 MG tablet   Oral   Take 1 tablet (6.25 mg total) by mouth 2 (two) times daily with a meal.   60 tablet   5   . fenofibrate 54 MG tablet   Oral   Take 1 tablet (54 mg total) by mouth daily.   30 tablet   6   . folic acid (FOLVITE) 1 MG tablet   Oral   Take 1 tablet (1 mg total) by mouth daily.   30 tablet   6   . Multiple Vitamin (ONE-A-DAY MENS PO)   Oral   Take 1 tablet by mouth daily.          . naproxen (NAPROSYN) 500 MG tablet   Oral   Take 1 tablet (500 mg total)  by mouth 2 (two) times daily with a meal.   30 tablet   0   . Omega-3 Fatty Acids (FISH OIL PO)   Oral   Take 1 capsule by mouth daily.         . sacubitril-valsartan (ENTRESTO) 24-26 MG   Oral   Take 1 tablet by mouth 2 (two) times daily.   60 tablet   5   . spironolactone (ALDACTONE) 25 MG tablet   Oral   Take 0.5 tablets (12.5 mg total) by mouth daily.   30 tablet   3   . traMADol (ULTRAM) 50 MG tablet   Oral   Take 1 tablet (50 mg total) by mouth every 6 (six) hours as needed.   20 tablet   0     Allergies Review of patient's allergies indicates no known allergies.  Family History  Problem Relation Age of Onset  . Diabetes Mother   . Hypertension Mother   . Diabetes Father   . Hypertension Father   . Cancer Neg Hx   . Heart disease Neg Hx   . Stroke Neg Hx     Social History Social History  Substance Use Topics  . Smoking status: Current Every Day Smoker -- 0.25 packs/day for 30 years    Types: Cigarettes  . Smokeless tobacco: Never Used  . Alcohol Use: 0.6 oz/week    1 Glasses of wine per week     Comment: occasionally    Review of Systems Constitutional: Positive fever/chills Eyes: No visual changes. ENT: No sore throat. Cardiovascular: Denies chest pain. Respiratory: Denies shortness of breath. Positive cough Gastrointestinal: No abdominal pain.  No nausea, no vomiting.  No diarrhea.  No constipation. Genitourinary: Negative for dysuria. Musculoskeletal: Negative for back pain. Skin: Negative for rash. Neurological: Negative for headaches, focal weakness or numbness.  10-point ROS otherwise negative.  ____________________________________________   PHYSICAL EXAM:  VITAL SIGNS: ED Triage Vitals  Enc Vitals Group     BP 05/12/15 0810 152/92 mmHg     Pulse Rate 05/12/15 0812 117     Resp 05/12/15 0810 18     Temp 05/12/15 0810 98.1 F (36.7 C)     Temp Source 05/12/15 0810 Oral     SpO2 05/12/15 0810 97 %     Weight 05/12/15 0810  180 lb (81.647 kg)     Height 05/12/15 0810 6\' 4"  (1.93 m)     Head Cir --      Peak Flow --      Pain Score 05/12/15 0811 8  Pain Loc --      Pain Edu? --      Excl. in [place]? --     Constitutional: Alert and oriented. Well appearing and in no acute distress. Eyes: Conjunctivae are normal. PERRL. EOMI. Head: Atraumatic. Nose: Moderate congestion/rhinnorhea. Mouth/Throat: Mucous membranes are moist.  Oropharynx non-erythematous. Posterior drainage present. Neck: No stridor.   Hematological/Lymphatic/Immunilogical: No cervical lymphadenopathy. Cardiovascular: Normal rate, regular rhythm. Grossly normal heart sounds.  Good peripheral circulation. Respiratory: Normal respiratory effort.  No retractions. Lungs coarse congested cough is heard throughout however patient does not have any wheezing at this time and does not appear to be any respiratory distress. Patient is able to talk in complete sentences without difficulty. Gastrointestinal: Soft and nontender. No distention.  Musculoskeletal: No lower extremity tenderness nor edema.  No joint effusions. Neurologic:  Normal speech and language. No gross focal neurologic deficits are appreciated. No gait instability. Skin:  Skin is warm, dry and intact. No rash noted. Psychiatric: Mood and affect are normal. Speech and behavior are normal.  ____________________________________________   LABS (all labs ordered are listed, but only abnormal results are displayed)  Labs Reviewed  RAPID INFLUENZA A&B ANTIGENS ([place])    RADIOLOGY  Chest x-ray per radiologist shows some mild chronic bronchitic changes. There is no evidence of pneumonia or CHF this time. ____________________________________________   PROCEDURES  Procedure(s) performed: None  Critical Care performed: No  ____________________________________________   INITIAL IMPRESSION / ASSESSMENT AND PLAN / ED COURSE  Pertinent labs & imaging results that were available  during my care of the patient were reviewed by me and considered in my medical decision making (see chart for details).  He was reassured this is not influenza. Patient was given prescription for [facility] as needed for cough. He is to continue Tylenol if needed and follow up with his primary care doctor if any continued problems. ____________________________________________   FINAL CLINICAL IMPRESSION(S) / ED DIAGNOSES  Final diagnoses:  Acute upper respiratory infection      Johnn Hai, PA-C 05/12/15 1010  Earleen Newport, MD 05/12/15 1023

## 2015-05-12 NOTE — ED Notes (Signed)
Cough and cold symptoms since yesterday am.

## 2015-05-12 NOTE — ED Notes (Signed)
States he developed cough  Fever and chills since yesterday.Marland KitchenMarland Kitchen

## 2015-05-12 NOTE — Discharge Instructions (Signed)
Upper Respiratory Infection, Adult Most upper respiratory infections (URIs) are caused by a virus. A URI affects the nose, throat, and upper air passages. The most common type of URI is often called "the common cold." HOME CARE   Take medicines only as told by your doctor.  Gargle warm saltwater or take cough drops to comfort your throat as told by your doctor.  Use a warm mist humidifier or inhale steam from a shower to increase air moisture. This may make it easier to breathe.  Drink enough fluid to keep your pee (urine) clear or pale yellow.  Eat soups and other clear broths.  Have a healthy diet.  Rest as needed.  Go back to work when your fever is gone or your doctor says it is okay.  You may need to stay home longer to avoid giving your URI to others.  You can also wear a face mask and wash your hands often to prevent spread of the virus.  Use your inhaler more if you have asthma.  Do not use any tobacco products, including cigarettes, chewing tobacco, or electronic cigarettes. If you need help quitting, ask your doctor. GET HELP IF:  You are getting worse, not better.  Your symptoms are not helped by medicine.  You have chills.  You are getting more short of breath.  You have brown or red mucus.  You have yellow or brown discharge from your nose.  You have pain in your face, especially when you bend forward.  You have a fever.  You have puffy (swollen) neck glands.  You have pain while swallowing.  You have white areas in the back of your throat. GET HELP RIGHT AWAY IF:   You have very bad or constant:  Headache.  Ear pain.  Pain in your forehead, behind your eyes, and over your cheekbones (sinus pain).  Chest pain.  You have long-lasting (chronic) lung disease and any of the following:  Wheezing.  Long-lasting cough.  Coughing up blood.  A change in your usual mucus.  You have a stiff neck.  You have changes in  your:  Vision.  Hearing.  Thinking.  Mood. MAKE SURE YOU:   Understand these instructions.  Will watch your condition.  Will get help right away if you are not doing well or get worse.   This information is not intended to replace advice given to you by your health care provider. Make sure you discuss any questions you have with your health care provider.   Document Released: 08/29/2007 Document Revised: 07/27/2014 Document Reviewed: 06/17/2013 Elsevier Interactive Patient Education 2016 [employer].   Tylenol as needed for fever. Drink fluids. Tessalon perles if needed for cough. Follow-up with your primary care doctor if any continued problems.

## 2015-05-16 ENCOUNTER — Ambulatory Visit (INDEPENDENT_AMBULATORY_CARE_PROVIDER_SITE_OTHER): Payer: Medicare Other | Admitting: Internal Medicine

## 2015-05-16 ENCOUNTER — Encounter: Payer: Self-pay | Admitting: Internal Medicine

## 2015-05-16 VITALS — BP 140/80 | HR 104 | Temp 98.6°F | Wt 178.0 lb

## 2015-05-16 DIAGNOSIS — M1990 Unspecified osteoarthritis, unspecified site: Secondary | ICD-10-CM | POA: Diagnosis not present

## 2015-05-16 DIAGNOSIS — M19049 Primary osteoarthritis, unspecified hand: Secondary | ICD-10-CM

## 2015-05-16 DIAGNOSIS — M10041 Idiopathic gout, right hand: Secondary | ICD-10-CM

## 2015-05-16 MED ORDER — TRAMADOL HCL 50 MG PO TABS: 50.0000 mg | ORAL_TABLET | Freq: Two times a day (BID) | ORAL | Status: DC

## 2015-05-16 MED ORDER — ALLOPURINOL 100 MG PO TABS: 100300.0000 mg | ORAL_TABLET | Freq: Every day | ORAL | Status: DC

## 2015-05-16 MED ORDER — PREDNISONE 10 MG PO TABS: ORAL_TABLET | ORAL | Status: DC

## 2015-05-16 NOTE — Patient Instructions (Signed)

## 2015-05-16 NOTE — Progress Notes (Signed)
Pre visit review using our clinic review tool, if applicable. No additional management support is needed unless otherwise documented below in the visit note. 

## 2015-05-16 NOTE — Progress Notes (Signed)
Subjective:    Patient ID: Travis [name], male    DOB: December 15, 1955, 60 y.o.   MRN: [id number]  HPI  Pt presents to the clinic today with c/o right hand pain. He was seen in the ER 2/8 for the same. Xray of the hand showed multiple arthritic changes. He was diagnosed with gout/arthritis and given Naproxen and Tramadol. He reports ongoing pain in his right hand, mainly in the index finger. The joint is swollen, warm and tender to touch. He was advised by his cardiologist to not take the Naproxen long term. He reports the Tramadol helps, but he is out. He ran out of [place] and never called back to have it filled. Uric Acid level from 07/2014 was normal. 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  Review of Systems  Past Medical History  Diagnosis Date  . Chronic systolic CHF (congestive heart failure) ([place])     a. EF 20-25% by echo 12/28/10; b improved to 45-50% on echo in June 2015  . Nonischemic cardiomyopathy ([place])     minimal coronary disease by cath 12/29/10  . Hemoptysis     secondary to pulmonary edema  . Hyperlipidemia   . Chronic anemia   . GI bleed     15 years ago  . Tobacco abuse   . Alcohol abuse   . Osteoarthritis   . Gout   . Diabetes mellitus, type 2 ([place])     pt reports his DM is gone  . 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 Coronary artery disease     Cardiac catheterization in November 2016 showed mild 30% proximal LAD stenosis with no evidence of obstructive disease.  . 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 albuterol (PROVENTIL HFA;VENTOLIN HFA) 108 (90 BASE) MCG/ACT inhaler Inhale 2 puffs into the lungs every 6 (six) hours as needed for wheezing or shortness of breath. Take 2 puffs every 5-10 minutes up to 6 puffs total over 15 minutes when needed.  Use with a spacer. 1  Inhaler 2  . aspirin EC 81 MG tablet Take 1 tablet (81 mg total) by mouth daily. 90 tablet 0  . benzonatate (TESSALON PERLES) 100 MG capsule Take 2 capsules (200 mg total) by mouth 3 (three) times daily as needed for cough. 40 capsule 0  . carvedilol (COREG) 6.25 MG tablet Take 1 tablet (6.25 mg total) by mouth 2 (two) times daily with a meal. 60 tablet 5  . fenofibrate 54 MG tablet Take 1 tablet (54 mg total) by mouth daily. 30 tablet 6  . folic acid (FOLVITE) 1 MG tablet Take 1 tablet (1 mg total) by mouth daily. 30 tablet 6  . Multiple Vitamin (ONE-A-DAY MENS PO) Take 1 tablet by mouth daily.     . Omega-3 Fatty Acids (FISH OIL PO) Take 1 capsule by mouth daily.    . sacubitril-valsartan (ENTRESTO) 24-26 MG Take 1 tablet by mouth 2 (two) times daily. 60 tablet 5  . spironolactone (ALDACTONE) 25 MG tablet Take 0.5 tablets (12.5 mg total) by mouth daily. 30 tablet 3  . traMADol (ULTRAM) 50 MG tablet Take 1 tablet (50 mg total) by mouth 2 (two) times daily. 60 tablet 0  . allopurinol (ZYLOPRIM) 100 MG tablet Take 1,003 tablets (100,300 mg total) by mouth daily. 90 tablet 5  .  naproxen (NAPROSYN) 500 MG tablet Take 1 tablet (500 mg total) by mouth 2 (two) times daily with a meal. (Patient not taking: Reported on 05/16/2015) 30 tablet 0  . predniSONE (DELTASONE) 10 MG tablet Take 6 tabs day 1, 5 tabs day 2, 4 tabs day 3, 3 tabs day 4, 2 tabs day 5, 1 tab day 6 21 tablet 0   No current facility-administered medications for this visit.    No Known Allergies  Family History  Problem Relation Age of Onset  . Diabetes Mother   . Hypertension Mother   . Diabetes Father   . Hypertension Father   . Cancer Neg Hx   . Heart disease Neg Hx   . Stroke Neg Hx     Social History   Social History  . Marital Status: Single    Spouse Name: N/A  . Number of Children: N/A  . Years of Education: N/A   Occupational History  . Not on file.   Social History Main Topics  . Smoking status: Current  Every Day Smoker -- 0.25 packs/day for 30 years    Types: Cigarettes  . Smokeless tobacco: Never Used  . Alcohol Use: 0.6 oz/week    1 Glasses of wine per week     Comment: occasionally  . Drug Use: Yes    Special: Marijuana     Comment: 1 blunt 1-2 times a month  . Sexual Activity: Yes   Other Topics Concern  . Not on file   Social History Narrative     Constitutional: Denies fever, malaise, fatigue, headache or abrupt weight changes.  Musculoskeletal: Pt reports joint pain and swelling. Denies  difficulty with gait, muscle pain.  Skin: Denies redness, rashes, lesions or ulcercations.   No other specific complaints in a complete review of systems (except as listed in HPI above).     Objective:   Physical Exam  BP 140/80 mmHg  Pulse 104  Temp(Src) 98.6 F (37 C) (Oral)  Wt 178 lb (80.74 kg)  SpO2 98% Wt Readings from Last 3 Encounters:  05/16/15 178 lb (80.74 kg)  05/12/15 180 lb (81.647 kg)  05/04/15 180 lb (81.647 kg)    General: Appears his stated age,  in NAD. Musculoskeletal: Decreased flexion and extension of pointer finger right hand. Joint enlargement noted at the MIP and DIP, pointer finger. Tender to palpation.  Neurological: Alert and oriented. Sensation intact to BUE.  BMET    Component Value Date/Time   NA 140 04/01/2015 0911   NA 140 01/28/2015 0527   NA 141 04/20/2014 1019   K 4.3 04/01/2015 0911   K 3.7 04/20/2014 1019   CL 100 04/01/2015 0911   CL 106 04/20/2014 1019   CO2 24 04/01/2015 0911   CO2 30 04/20/2014 1019   GLUCOSE 104* 04/01/2015 0911   GLUCOSE 115* 01/28/2015 0527   GLUCOSE 102* 04/20/2014 1019   BUN 15 04/01/2015 0911   BUN 29* 01/28/2015 0527   BUN 14 04/20/2014 1019   CREATININE 1.23 04/01/2015 0911   CREATININE 1.18 04/20/2014 1019   CALCIUM 9.3 04/01/2015 0911   CALCIUM 9.3 04/20/2014 1019   GFRNONAA 64 04/01/2015 0911   GFRNONAA >60 04/20/2014 1019   GFRNONAA >60 11/19/2012 1107   GFRAA 74 04/01/2015 0911    GFRAA >60 04/20/2014 1019   GFRAA >60 11/19/2012 1107    Lipid Panel     Component Value Date/Time   CHOL 153 12/31/2013 1403   TRIG 221.0* 12/31/2013 1403  HDL 28.70* 12/31/2013 1403   CHOLHDL 5 12/31/2013 1403   VLDL 44.2* 12/31/2013 1403   LDLCALC UNABLE TO CALCULATE IF TRIGLYCERIDE OVER 400 mg/dL 09/14/2013 0510    CBC    Component Value Date/Time   WBC 9.1 01/28/2015 0527   WBC 7.9 04/20/2014 1019   RBC 4.23* 01/28/2015 0527   RBC 4.65 04/20/2014 1019   RBC 2.93* 12/28/2010 0345   HGB 13.0 01/28/2015 0527   HGB 13.5 04/20/2014 1019   HCT 40.3 01/28/2015 0527   HCT 41.8 04/20/2014 1019   PLT 221 01/28/2015 0527   PLT 348 04/20/2014 1019   MCV 95.2 01/28/2015 0527   MCV 90 04/20/2014 1019   MCH 30.8 01/28/2015 0527   MCH 29.1 04/20/2014 1019   MCHC 32.4 01/28/2015 0527   MCHC 32.4 04/20/2014 1019   RDW 15.1* 01/28/2015 0527   RDW 16.2* 04/20/2014 1019   LYMPHSABS 1.5 01/26/2015 1020   LYMPHSABS 2.1 04/20/2014 1019   MONOABS 0.3 01/26/2015 1020   MONOABS 0.5 04/20/2014 1019   EOSABS 0.9* 01/26/2015 1020   EOSABS 0.3 04/20/2014 1019   BASOSABS 0.0 01/26/2015 1020   BASOSABS 0.1 04/20/2014 1019   BASOSABS 0 06/13/2012 1515    Hgb A1C Lab Results  Component Value Date   HGBA1C 6.6* 12/31/2013         Assessment & Plan:   Hospital follow up for gout/arthritis, right hand:  Hospital notes, labs and imaging reviewed eRx for Pred Taper x 6 days Start Allopurinol 100 mg daily after you finish the pred taper RX for Tramadol 50 mg BID, no early refills  RTC as needed or if symptoms persist or worsen

## 2015-05-23 ENCOUNTER — Ambulatory Visit (INDEPENDENT_AMBULATORY_CARE_PROVIDER_SITE_OTHER): Payer: Medicare Other | Admitting: Cardiovascular Disease

## 2015-05-23 ENCOUNTER — Ambulatory Visit: Payer: Medicare Other | Attending: Family | Admitting: Family

## 2015-05-23 ENCOUNTER — Encounter: Payer: Self-pay | Admitting: Cardiovascular Disease

## 2015-05-23 ENCOUNTER — Encounter: Payer: Self-pay | Admitting: Family

## 2015-05-23 VITALS — BP 118/70 | HR 109 | Ht 76.0 in | Wt 186.5 lb

## 2015-05-23 VITALS — BP 155/89 | HR 101 | Resp 18 | Ht 76.0 in | Wt 186.0 lb

## 2015-05-23 DIAGNOSIS — M109 Gout, unspecified: Secondary | ICD-10-CM | POA: Insufficient documentation

## 2015-05-23 DIAGNOSIS — E785 Hyperlipidemia, unspecified: Secondary | ICD-10-CM | POA: Diagnosis not present

## 2015-05-23 DIAGNOSIS — I428 Other cardiomyopathies: Secondary | ICD-10-CM

## 2015-05-23 DIAGNOSIS — Z9889 Other specified postprocedural states: Secondary | ICD-10-CM | POA: Diagnosis not present

## 2015-05-23 DIAGNOSIS — M1990 Unspecified osteoarthritis, unspecified site: Secondary | ICD-10-CM | POA: Insufficient documentation

## 2015-05-23 DIAGNOSIS — I5022 Chronic systolic (congestive) heart failure: Secondary | ICD-10-CM

## 2015-05-23 DIAGNOSIS — F17200 Nicotine dependence, unspecified, uncomplicated: Secondary | ICD-10-CM

## 2015-05-23 DIAGNOSIS — I2510 Atherosclerotic heart disease of native coronary artery without angina pectoris: Secondary | ICD-10-CM | POA: Insufficient documentation

## 2015-05-23 DIAGNOSIS — E119 Type 2 diabetes mellitus without complications: Secondary | ICD-10-CM | POA: Diagnosis not present

## 2015-05-23 DIAGNOSIS — D649 Anemia, unspecified: Secondary | ICD-10-CM | POA: Diagnosis not present

## 2015-05-23 DIAGNOSIS — I10 Essential (primary) hypertension: Secondary | ICD-10-CM | POA: Diagnosis not present

## 2015-05-23 DIAGNOSIS — I429 Cardiomyopathy, unspecified: Secondary | ICD-10-CM

## 2015-05-23 DIAGNOSIS — R000 Tachycardia, unspecified: Secondary | ICD-10-CM

## 2015-05-23 DIAGNOSIS — I110 Hypertensive heart disease with heart failure: Secondary | ICD-10-CM | POA: Diagnosis not present

## 2015-05-23 DIAGNOSIS — F17210 Nicotine dependence, cigarettes, uncomplicated: Secondary | ICD-10-CM | POA: Diagnosis not present

## 2015-05-23 DIAGNOSIS — I426 Alcoholic cardiomyopathy: Secondary | ICD-10-CM | POA: Diagnosis not present

## 2015-05-23 DIAGNOSIS — Z7982 Long term (current) use of aspirin: Secondary | ICD-10-CM | POA: Diagnosis not present

## 2015-05-23 DIAGNOSIS — F1010 Alcohol abuse, uncomplicated: Secondary | ICD-10-CM | POA: Diagnosis not present

## 2015-05-23 DIAGNOSIS — Z79899 Other long term (current) drug therapy: Secondary | ICD-10-CM | POA: Insufficient documentation

## 2015-05-23 MED ORDER — CARVEDILOL 6.25 MG PO TABS: 6.2500 mg | ORAL_TABLET | Freq: Two times a day (BID) | ORAL | Status: DC

## 2015-05-23 MED ORDER — SPIRONOLACTONE 25 MG PO TABS: 12.5000 mg | ORAL_TABLET | Freq: Every day | ORAL | Status: DC

## 2015-05-23 NOTE — Progress Notes (Signed)
Subjective:    Patient ID: Travis [name], male    DOB: 1955-04-12, 60 y.o.   MRN: [id number]  Congestive Heart Failure Presents for follow-up visit. The disease course has been stable. Associated symptoms include fatigue and shortness of breath. Pertinent negatives include no abdominal pain, chest pain, chest pressure, edema, orthopnea or palpitations. The symptoms have been stable. Past treatments include angiotensin receptor blockers, aldosterone receptor blockers, salt and fluid restriction and beta blockers. The treatment provided moderate relief. Compliance with prior treatments has been variable. Prior compliance problems include difficulty understanding directions. His past medical history is significant for anemia, CAD, DM and HTN. There is no history of DVT. Compliance with total regimen is 76-100%.  Hypertension This is a chronic problem. The current episode started more than 1 month ago. The problem has been waxing and waning since onset. Associated symptoms include shortness of breath. Pertinent negatives include no chest pain, headaches, neck pain, palpitations or peripheral edema. There are no associated agents to hypertension. Risk factors for coronary artery disease include diabetes mellitus, dyslipidemia, family history, male gender and smoking/tobacco exposure. Past treatments include angiotensin blockers, ACE inhibitors, beta blockers, diuretics and lifestyle changes. The current treatment provides moderate improvement. Hypertensive end-organ damage includes heart failure. There is no history of CVA.  Other This is a recurrent (tachycardia) problem. The current episode started 1 to 4 weeks ago. The problem occurs daily. Associated symptoms include congestion, coughing (improving) and fatigue. Pertinent negatives include no abdominal pain, chest pain, headaches, myalgias, neck pain, sore throat or weakness. Nothing aggravates the symptoms. He has tried nothing for the symptoms.     Past Medical History  Diagnosis Date  . Chronic systolic CHF (congestive heart failure) ([place])     a. EF 20-25% by echo 12/28/10; b improved to 45-50% on echo in June 2015  . Nonischemic cardiomyopathy ([place])     minimal coronary disease by cath 12/29/10  . Hemoptysis     secondary to pulmonary edema  . Hyperlipidemia   . Chronic anemia   . GI bleed     15 years ago  . Tobacco abuse   . Alcohol abuse   . Osteoarthritis   . Gout   . Diabetes mellitus, type 2 ([place])     pt reports his DM is gone  . 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 Coronary artery disease     Cardiac catheterization in November 2016 showed mild 30% proximal LAD stenosis with no evidence of obstructive disease.  . 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 Cardiac catheterization    . Hip arthroplasty Right 03/15/2013    Procedure: ARTHROPLASTY BIPOLAR HIP;  Surgeon: [name], MD;  Location: [facility];  Service: Orthopedics;  Laterality: Right;  . Cardiac catheterization N/A 01/28/2015    Procedure: Left Heart Cath and Coronary Angiography;  Surgeon: Wellington Hampshire, MD;  Location: [facility];  Service: Cardiovascular;  Laterality: N/A;    Family History  Problem Relation Age of Onset  . Diabetes Mother   . Hypertension Mother   . Diabetes Father   . Hypertension Father   . Cancer Neg Hx   . Heart disease Neg Hx   . Stroke Neg Hx     Social History  Substance Use Topics  . Smoking status: Current Every Day Smoker -- 0.25 packs/day for 30 years    Types: Cigarettes  .  Smokeless tobacco: Never Used  . Alcohol Use: 0.6 oz/week    1 Glasses of wine per week     Comment: occasionally    No Known Allergies  Prior to Admission medications   Medication Sig Start Date End Date Taking? Authorizing Provider  albuterol (PROVENTIL HFA;VENTOLIN HFA) 108 (90 BASE) MCG/ACT inhaler  Inhale 2 puffs into the lungs every 6 (six) hours as needed for wheezing or shortness of breath. Take 2 puffs every 5-10 minutes up to 6 puffs total over 15 minutes when needed.  Use with a spacer. 01/24/15  Yes Ahmed Prima, MD  allopurinol (ZYLOPRIM) 100 MG tablet Take 100 mg by mouth daily.   Yes Historical Provider, MD  aspirin EC 81 MG tablet Take 1 tablet (81 mg total) by mouth daily. 02/04/15  Yes Wellington Hampshire, MD  benzonatate (TESSALON PERLES) 100 MG capsule Take 2 capsules (200 mg total) by mouth 3 (three) times daily as needed for cough. 05/12/15 05/11/16 Yes Johnn Hai, PA-C  carvedilol (COREG) 6.25 MG tablet Take 1 tablet (6.25 mg total) by mouth 2 (two) times daily with a meal. 05/23/15  Yes Wellington Hampshire, MD  fenofibrate 54 MG tablet Take 1 tablet (54 mg total) by mouth daily. 12/28/14  Yes Amy D Clegg, NP  folic acid (FOLVITE) 1 MG tablet Take 1 tablet (1 mg total) by mouth daily. 12/28/14  Yes Amy D Clegg, NP  Multiple Vitamin (ONE-A-DAY MENS PO) Take 1 tablet by mouth daily.    Yes Historical Provider, MD  Omega-3 Fatty Acids (FISH OIL PO) Take 1 capsule by mouth daily.   Yes Historical Provider, MD  sacubitril-valsartan (ENTRESTO) 24-26 MG Take 1 tablet by mouth 2 (two) times daily. 04/18/15  Yes Alisa Graff, FNP  spironolactone (ALDACTONE) 25 MG tablet Take 0.5 tablets (12.5 mg total) by mouth daily. 05/23/15  Yes Wellington Hampshire, MD  traMADol (ULTRAM) 50 MG tablet Take 1 tablet (50 mg total) by mouth 2 (two) times daily. 05/16/15  Yes [name], NP     Review of Systems  Constitutional: Positive for fatigue. Negative for appetite change.  HENT: Positive for congestion and rhinorrhea. Negative for sore throat.   Eyes: Negative.   Respiratory: Positive for cough (improving) and shortness of breath. Negative for chest tightness and wheezing.   Cardiovascular: Negative for chest pain, palpitations and leg swelling.  Gastrointestinal: Negative for abdominal pain  and abdominal distention.  Endocrine: Negative.   Genitourinary: Negative.   Musculoskeletal: Positive for back pain (chronic). Negative for myalgias and neck pain.  Skin: Negative.   Allergic/Immunologic: Negative.   Neurological: Negative for dizziness, weakness, light-headedness and headaches.  Hematological: Negative for adenopathy. Does not bruise/bleed easily.  Psychiatric/Behavioral: Negative for sleep disturbance (sleeping on 1-2 pillows) and dysphoric mood. The patient is not nervous/anxious.        Objective:   Physical Exam  Constitutional: He is oriented to person, place, and time. He appears well-developed and well-nourished.  HENT:  Head: Normocephalic and atraumatic.  Eyes: Conjunctivae are normal. Pupils are equal, round, and reactive to light.  Neck: Normal range of motion. Neck supple.  Cardiovascular: Regular rhythm.  Tachycardia present.   Pulmonary/Chest: Effort normal. He has no wheezes. He has no rales.  Abdominal: Soft. He exhibits no distension. There is no tenderness.  Musculoskeletal: He exhibits no edema or tenderness.  Neurological: He is alert and oriented to person, place, and time.  Skin: Skin is warm and dry.  Psychiatric: He has a normal mood and affect. His behavior is normal. Thought content normal.  Nursing note and vitals reviewed.   BP 155/89 mmHg  Pulse 101  Resp 18  Ht 6\' 4"  (1.93 m)  Wt 186 lb (84.369 kg)  BMI 22.65 kg/m2  SpO2 99%       Assessment & Plan:  1: Chronic heart failure with reduced ejection fraction- Patient presents with fatigue and shortness of breath upon exertion. He says that he was a little short of breath upon walking into the office but once he sat down to rest for a few minutes, his breathing improved. He continues to weigh himself daily and says that his weight has gone up a little bit. By our scale, he's gained 6 pounds since he was last here on 04/18/15. Reminded to call for an overnight weight gain of >2  pounds or a weekly weight gain of >5 pounds. He is not adding any salt to his food and is trying to follow a low sodium diet. He saw his cardiologist earlier today and patient says that he stopped taking his carvedilol when he started taking the entresto. His cardiologist has sent in a new prescription for him to resume his carvedilol and patient does verbalize understanding of how to take his medications. Has tolerated the entresto without known side effects and he says that that he had lab work drawn today at the cardiologist's office. Discussed possibly increasing his entresto, increasing his carvedilol or adding bidil at his next office visit. 2: HTN- Blood pressure slightly elevated from his last visit but, again, he hadn't been taking his carvedilol.  3: Tachycardia- Heart rate elevated which should begin to come down after he resumes his carvedilol. Will continue to monitor. 4: Tobacco use- Patient continues to smoke on a daily basis and really doesn't want to quit at this time. Complete cessation discussed for 3 minutes with the patient.  Return here in 1 month or sooner for any questions/problems before the next office visit.

## 2015-05-23 NOTE — Patient Instructions (Addendum)
Medication Instructions:  Your physician has recommended you make the following change in your medication:  1. Restart Carvedilol 6.25 mg Twice Daily 2. Refills sent in for Carvedilol and Spirnolactone    Labwork: Your physician recommends that you return for lab work BMET today   Testing/Procedures: None ordered  Follow-Up: Your physician recommends that you schedule a follow-up appointment in: 3 months with Dr. Fletcher Anon  Date & Time: _________________________________________________   Any Other Special Instructions Will Be Listed Below (If Applicable).     If you need a refill on your cardiac medications before your next appointment, please call your pharmacy.

## 2015-05-23 NOTE — Progress Notes (Signed)
Cardiology Office Note   Date:  05/23/2015   ID:  Travis [name], DOB Sep 21, 1955, MRN [id number]  PCP:  [name], NP  Cardiologist:   Kathlyn Sacramento, MD   Chief Complaint  Patient presents with  . other    2 month follow up. Meds reviewed by the patient verbally. "doing well."       History of Present Illness: Travis [name] is a 60 y.o. male who presents for a follow up visit.   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 His previous ejection fraction in June 2015 was 45-50%.  Most recent echocardiogram in November 2016 showed worsening LV systolic dysfunction with an ejection fraction of 25-30%. Due to that, he underwent cardiac catheterization which showed mild nonobstructive one-vessel coronary artery disease. LVEDP was mildly elevated. He improved with diuresis. He quit alcohol completely since his hospital discharge.   he has been doing reasonably well overall with no reported chest pain. Shortness of breath is gradually improving .  He denies any orthopnea, PND or significant leg edema. He was seen in the heart failure clinic and was switched from lisinopril to [facility]. He got confused about the changes and thought that he was supposed to stop carvedilol. He has not been taking carvedilol since then. He is noted to be mildly tachycardic today.     Past Medical History  Diagnosis Date  . Chronic systolic CHF (congestive heart failure) ([place])     a. EF 20-25% by echo 12/28/10; b improved to 45-50% on echo in June 2015  . Nonischemic cardiomyopathy ([place])     minimal coronary disease by cath 12/29/10  . Hemoptysis     secondary to pulmonary edema  . Hyperlipidemia   . Chronic anemia   . GI bleed     15 years ago  . Tobacco abuse   . Alcohol abuse   . Osteoarthritis   . Gout   .  Diabetes mellitus, type 2 ([place])     pt reports his DM is gone  . 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 Coronary artery disease     Cardiac catheterization in November 2016 showed mild 30% proximal LAD stenosis with no evidence of obstructive disease.  . 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 Cardiac catheterization    . Hip arthroplasty Right 03/15/2013    Procedure: ARTHROPLASTY BIPOLAR HIP;  Surgeon: [name], MD;  Location: [facility];  Service: Orthopedics;  Laterality: Right;  . Cardiac catheterization N/A 01/28/2015    Procedure: Left Heart Cath and Coronary Angiography;  Surgeon: Wellington Hampshire, MD;  Location: [facility];  Service: Cardiovascular;  Laterality: N/A;     Current Outpatient Prescriptions  Medication Sig Dispense Refill  . albuterol (PROVENTIL HFA;VENTOLIN HFA) 108 (90 BASE) MCG/ACT inhaler Inhale 2 puffs into the lungs every 6 (six) hours as needed for wheezing or shortness of breath. Take 2 puffs every 5-10 minutes up to 6 puffs total over 15 minutes when needed.  Use with a spacer. 1 Inhaler 2  . allopurinol (ZYLOPRIM) 100 MG tablet Take 100 mg by mouth daily.    [name] aspirin EC 81 MG tablet Take 1 tablet (81 mg total) by mouth daily. 90 tablet 0  . benzonatate (TESSALON PERLES)  100 MG capsule Take 2 capsules (200 mg total) by mouth 3 (three) times daily as needed for cough. 40 capsule 0  . carvedilol (COREG) 6.25 MG tablet Take 1 tablet (6.25 mg total) by mouth 2 (two) times daily with a meal. 60 tablet 5  . fenofibrate 54 MG tablet Take 1 tablet (54 mg total) by mouth daily. 30 tablet 6  . folic acid (FOLVITE) 1 MG tablet Take 1 tablet (1 mg total) by mouth daily. 30 tablet 6  . Multiple Vitamin (ONE-A-DAY MENS PO) Take 1 tablet by mouth daily.     . Omega-3 Fatty Acids (FISH OIL PO) Take 1 capsule by mouth daily.    . sacubitril-valsartan  (ENTRESTO) 24-26 MG Take 1 tablet by mouth 2 (two) times daily. 60 tablet 5  . spironolactone (ALDACTONE) 25 MG tablet Take 0.5 tablets (12.5 mg total) by mouth daily. 30 tablet 3  . traMADol (ULTRAM) 50 MG tablet Take 1 tablet (50 mg total) by mouth 2 (two) times daily. 60 tablet 0   No current facility-administered medications for this visit.    Allergies:   Review of patient's allergies indicates no known allergies.    Social History:  The patient  reports that he has been smoking Cigarettes.  He has a 7.5 pack-year smoking history. He has never used smokeless tobacco. He reports that he drinks about 0.6 oz of alcohol per week. He reports that he uses illicit drugs (Marijuana).   Family History:  The patient's family history includes Diabetes in his father and mother; Hypertension in his father and mother. There is no history of Cancer, Heart disease, or Stroke.    ROS:  Please see the history of present illness.   Otherwise, review of systems are positive for none.   All other systems are reviewed and negative.    PHYSICAL EXAM: VS:  BP 118/70 mmHg  Pulse 109  Ht 6\' 4"  (1.93 m)  Wt 186 lb 8 oz (84.596 kg)  BMI 22.71 kg/m2  SpO2 98% , BMI Body mass index is 22.71 kg/(m^2). GEN: Well nourished, well developed, in no acute distress HEENT: normal Neck: no JVD, carotid bruits, or masses Cardiac: RRR; no murmurs, rubs, or gallops,no edema  Respiratory:  clear to auscultation bilaterally, normal work of breathing GI: soft, nontender, nondistended, + BS MS: no deformity or atrophy Skin: warm and dry, no rash Neuro:  Strength and sensation are intact Psych: euthymic mood, full affect   EKG:  EKG is ordered today. The ekg ordered today demonstrates  sinus tachycardia with left anterior fascicular block and left ventricular hypertrophy.   Recent Labs: 01/26/2015: ALT 17; B Natriuretic Peptide 1453.0*; Magnesium 1.8; TSH 0.224* 01/28/2015: Hemoglobin 13.0; Platelets 221 04/01/2015:  BUN 15; Creatinine, Ser 1.23; Potassium 4.3; Sodium 140    Lipid Panel    Component Value Date/Time   CHOL 153 12/31/2013 1403   TRIG 221.0* 12/31/2013 1403   HDL 28.70* 12/31/2013 1403   CHOLHDL 5 12/31/2013 1403   VLDL 44.2* 12/31/2013 1403   LDLCALC UNABLE TO CALCULATE IF TRIGLYCERIDE OVER 400 mg/dL 09/14/2013 0510   LDLDIRECT 87.3 12/31/2013 1403      Wt Readings from Last 3 Encounters:  05/23/15 186 lb 8 oz (84.596 kg)  05/23/15 186 lb (84.369 kg)  05/16/15 178 lb (80.74 kg)       ASSESSMENT AND PLAN: MN  1.   Chronic systolic heart failure: Due to suspected alcohol-induced cardiomyopathy. He appears to be euvolemic. He is tachycardic today  likely due to mistakenly stopping carvedilol. I resumed carvedilol today at the same dose of 6.25 mg twice daily. He is otherwise on optimal medical therapy. Check basic metabolic profile today.  2. Essential hypertension: Blood pressure is well controlled on current medications. Resume carvedilol as outlined above.       Disposition:   FU with me in 3 months  Signed, Kathlyn Sacramento, MD  05/23/2015 1:28 PM    [place]

## 2015-05-23 NOTE — Patient Instructions (Signed)
Continue weighing daily and call for an overnight weight gain of > 2 pounds or a weekly weight gain of >5 pounds. 

## 2015-05-24 LAB — BASIC METABOLIC PANEL
BUN/Creatinine Ratio: 20 (ref 9–20)
BUN: 18 mg/dL (ref 6–24)
CALCIUM: 9.1 mg/dL (ref 8.7–10.2)
CHLORIDE: 100 mmol/L (ref 96–106)
CO2: 22 mmol/L (ref 18–29)
Creatinine, Ser: 0.88 mg/dL (ref 0.76–1.27)
GFR calc Af Amer: 109 mL/min/{1.73_m2} (ref >59)
GFR calc non Af Amer: 94 mL/min/{1.73_m2} (ref >59)
GLUCOSE: 130 mg/dL — AB (ref 65–99)
Potassium: 4.5 mmol/L (ref 3.5–5.2)
Sodium: 140 mmol/L (ref 134–144)

## 2015-06-21 ENCOUNTER — Ambulatory Visit: Payer: Medicare Other | Admitting: Family

## 2015-06-22 ENCOUNTER — Telehealth: Payer: Self-pay | Admitting: Family

## 2015-06-22 NOTE — Telephone Encounter (Signed)
Patient did not show for his Heart Failure Clinic appointment on 06/21/15. Will attempt to reschedule.

## 2015-06-27 ENCOUNTER — Ambulatory Visit: Payer: Medicare Other | Attending: Family | Admitting: Family

## 2015-06-27 ENCOUNTER — Encounter: Payer: Self-pay | Admitting: Family

## 2015-06-27 VITALS — BP 132/88 | HR 83 | Resp 18 | Ht 76.0 in | Wt 190.0 lb

## 2015-06-27 DIAGNOSIS — E119 Type 2 diabetes mellitus without complications: Secondary | ICD-10-CM | POA: Insufficient documentation

## 2015-06-27 DIAGNOSIS — Z79899 Other long term (current) drug therapy: Secondary | ICD-10-CM | POA: Insufficient documentation

## 2015-06-27 DIAGNOSIS — I10 Essential (primary) hypertension: Secondary | ICD-10-CM | POA: Insufficient documentation

## 2015-06-27 DIAGNOSIS — Z7982 Long term (current) use of aspirin: Secondary | ICD-10-CM | POA: Insufficient documentation

## 2015-06-27 DIAGNOSIS — F17210 Nicotine dependence, cigarettes, uncomplicated: Secondary | ICD-10-CM | POA: Insufficient documentation

## 2015-06-27 DIAGNOSIS — I2510 Atherosclerotic heart disease of native coronary artery without angina pectoris: Secondary | ICD-10-CM | POA: Diagnosis not present

## 2015-06-27 DIAGNOSIS — D649 Anemia, unspecified: Secondary | ICD-10-CM | POA: Diagnosis not present

## 2015-06-27 DIAGNOSIS — I426 Alcoholic cardiomyopathy: Secondary | ICD-10-CM | POA: Diagnosis not present

## 2015-06-27 DIAGNOSIS — I5022 Chronic systolic (congestive) heart failure: Secondary | ICD-10-CM | POA: Diagnosis not present

## 2015-06-27 DIAGNOSIS — R000 Tachycardia, unspecified: Secondary | ICD-10-CM

## 2015-06-27 DIAGNOSIS — I429 Cardiomyopathy, unspecified: Secondary | ICD-10-CM | POA: Diagnosis not present

## 2015-06-27 DIAGNOSIS — M109 Gout, unspecified: Secondary | ICD-10-CM | POA: Diagnosis not present

## 2015-06-27 DIAGNOSIS — E785 Hyperlipidemia, unspecified: Secondary | ICD-10-CM | POA: Insufficient documentation

## 2015-06-27 DIAGNOSIS — Z9889 Other specified postprocedural states: Secondary | ICD-10-CM | POA: Diagnosis not present

## 2015-06-27 DIAGNOSIS — I509 Heart failure, unspecified: Secondary | ICD-10-CM | POA: Insufficient documentation

## 2015-06-27 DIAGNOSIS — M25511 Pain in right shoulder: Secondary | ICD-10-CM | POA: Insufficient documentation

## 2015-06-27 DIAGNOSIS — F17200 Nicotine dependence, unspecified, uncomplicated: Secondary | ICD-10-CM

## 2015-06-27 NOTE — Progress Notes (Signed)
Subjective:    Patient ID: Travis [name], male    DOB: Dec 26, 1955, 60 y.o.   MRN: [id number]  Congestive Heart Failure Presents for follow-up visit. The disease course has been stable. Associated symptoms include fatigue. Pertinent negatives include no abdominal pain, chest pain, chest pressure, edema, orthopnea, palpitations or shortness of breath. The symptoms have been stable. Past treatments include angiotensin receptor blockers, beta blockers, salt and fluid restriction and aldosterone receptor blockers. The treatment provided significant relief. Compliance with prior treatments has been good. His past medical history is significant for anemia, CAD and HTN. There is no history of CVA. He has multiple 1st degree relatives with heart disease.  Hypertension This is a chronic problem. The current episode started more than 1 year ago. The problem has been gradually improving since onset. The problem is controlled. Pertinent negatives include no chest pain, headaches, neck pain, palpitations, peripheral edema or shortness of breath. There are no associated agents to hypertension. Risk factors for coronary artery disease include family history, male gender and smoking/tobacco exposure. Past treatments include angiotensin blockers, ACE inhibitors, beta blockers, diuretics and lifestyle changes. The current treatment provides significant improvement. There are no compliance problems.  Hypertensive end-organ damage includes CAD/MI and heart failure.    Past Medical History  Diagnosis Date  . Chronic systolic CHF (congestive heart failure) ([place])     a. EF 20-25% by echo 12/28/10; b improved to 45-50% on echo in June 2015  . Nonischemic cardiomyopathy ([place])     minimal coronary disease by cath 12/29/10  . Hemoptysis     secondary to pulmonary edema  . Hyperlipidemia   . Chronic anemia   . GI bleed     15 years ago  . Tobacco abuse   . Alcohol abuse   . Osteoarthritis   . Gout   . Diabetes  mellitus, type 2 ([place])     pt reports his DM is gone  . 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 Coronary artery disease     Cardiac catheterization in November 2016 showed mild 30% proximal LAD stenosis with no evidence of obstructive disease.  . 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 Cardiac catheterization    . Hip arthroplasty Right 03/15/2013    Procedure: ARTHROPLASTY BIPOLAR HIP;  Surgeon: [name], MD;  Location: [facility];  Service: Orthopedics;  Laterality: Right;  . Cardiac catheterization N/A 01/28/2015    Procedure: Left Heart Cath and Coronary Angiography;  Surgeon: Wellington Hampshire, MD;  Location: [facility];  Service: Cardiovascular;  Laterality: N/A;    Family History  Problem Relation Age of Onset  . Diabetes Mother   . Hypertension Mother   . Diabetes Father   . Hypertension Father   . Cancer Neg Hx   . Heart disease Neg Hx   . Stroke Neg Hx     Social History  Substance Use Topics  . Smoking status: Current Every Day Smoker -- 0.25 packs/day for 30 years    Types: Cigarettes  . Smokeless tobacco: Never Used  . Alcohol Use: 0.6 oz/week    1 Glasses of wine per week     Comment: occasionally    No Known Allergies  Prior to Admission medications   Medication Sig Start Date End Date Taking? Authorizing Provider  albuterol (PROVENTIL HFA;VENTOLIN HFA) 108 (90 BASE) MCG/ACT inhaler Inhale 2 puffs into the  lungs every 6 (six) hours as needed for wheezing or shortness of breath. Take 2 puffs every 5-10 minutes up to 6 puffs total over 15 minutes when needed.  Use with a spacer. 01/24/15  Yes Ahmed Prima, MD  allopurinol (ZYLOPRIM) 100 MG tablet Take 100 mg by mouth daily.   Yes Historical Provider, MD  aspirin 81 MG tablet Take 81 mg by mouth daily.   Yes Historical Provider, MD  carvedilol (COREG) 6.25 MG tablet Take 1 tablet (6.25  mg total) by mouth 2 (two) times daily with a meal. 05/23/15  Yes Wellington Hampshire, MD  fenofibrate 54 MG tablet Take 1 tablet (54 mg total) by mouth daily. 12/28/14  Yes Amy D Clegg, NP  folic acid (FOLVITE) 1 MG tablet Take 1 tablet (1 mg total) by mouth daily. 12/28/14  Yes Amy D Clegg, NP  Multiple Vitamin (ONE-A-DAY MENS PO) Take 1 tablet by mouth daily.    Yes Historical Provider, MD  Omega-3 Fatty Acids (FISH OIL PO) Take 1 capsule by mouth daily.   Yes Historical Provider, MD  sacubitril-valsartan (ENTRESTO) 24-26 MG Take 1 tablet by mouth 2 (two) times daily. 04/18/15  Yes Alisa Graff, FNP  spironolactone (ALDACTONE) 25 MG tablet Take 0.5 tablets (12.5 mg total) by mouth daily. 05/23/15  Yes Wellington Hampshire, MD  traMADol (ULTRAM) 50 MG tablet Take 1 tablet (50 mg total) by mouth 2 (two) times daily. 05/16/15  Yes [name], NP     Review of Systems  Constitutional: Positive for appetite change (due to pain in shoulder) and fatigue.  HENT: Negative for congestion, postnasal drip and sore throat.   Eyes: Negative.   Respiratory: Positive for cough (loose cough). Negative for chest tightness, shortness of breath and wheezing.   Cardiovascular: Negative for chest pain, palpitations and leg swelling.  Gastrointestinal: Negative for abdominal pain and abdominal distention.  Endocrine: Negative.   Genitourinary: Negative.   Musculoskeletal: Positive for back pain and arthralgias (right shoulder). Negative for neck pain.  Skin: Negative.   Allergic/Immunologic: Negative.   Neurological: Negative for dizziness, light-headedness and headaches.  Hematological: Negative for adenopathy. Does not bruise/bleed easily.  Psychiatric/Behavioral: Negative for sleep disturbance (sleeping on 1 pillow) and dysphoric mood. The patient is not nervous/anxious.        Objective:   Physical Exam  Constitutional: He is oriented to person, place, and time. He appears well-developed and well-nourished.   HENT:  Head: Normocephalic and atraumatic.  Eyes: Conjunctivae are normal. Pupils are equal, round, and reactive to light.  Neck: Normal range of motion. Neck supple.  Cardiovascular: Normal rate and regular rhythm.   Pulmonary/Chest: Effort normal. He has no wheezes. He has no rales.  Abdominal: Soft. He exhibits no distension. There is no tenderness.  Musculoskeletal: He exhibits no edema.       Right shoulder: He exhibits pain.  Neurological: He is alert and oriented to person, place, and time.  Skin: Skin is warm and dry.  Psychiatric: He has a normal mood and affect. His behavior is normal. Thought content normal.  Nursing note and vitals reviewed.   BP 132/88 mmHg  Pulse 83  Resp 18  Ht 6\' 4"  (1.93 m)  Wt 190 lb (86.183 kg)  BMI 23.14 kg/m2  SpO2 100%       Assessment & Plan:  1: Chronic heart failure with reduced ejection fraction- Patient presents with fatigue upon exertion which does get better upon rest. Denies any shortness of  breath or swelling in his lower legs. Continues to weigh himself daily and says that his weight has been stable. By our scale, he has gained 4 pounds since he was last here. Reminded him to call for an overnight weight gain of >2 pounds or a weekly weight gain of >5 pounds. He is not adding any salt to his food and tries to follow a low sodium diet. He is tolerating his medications and is now back to taking everything. Discussed increasing his carvedilol at his next office visit. [facility] went in and reviewed medications with the patient.  2: Tachycardia- Heart rate much better since resuming his carvedilol.  3: HTN- Blood pressure looks good today. 4: Right shoulder pain- He says that he is planning on calling his PCP to have his right shoulder evaluated. He thinks it's his rotator cuff and is having constant pain in his shoulder. Has taken tramadol in the past but says that he's currently out of it.  5: Tobacco use- Patient says that he  continues to smoke about 1/2 ppd of cigarettes daily. He says that he's trying to decrease his smoking and says that he tends to smoke after he eats and when in the car. Discussed the importance of trying to change his habits to do something else at those times instead of smoke. Discussed cessation for 3 minutes with him.   Return in 1 month or sooner for any questions/problems before then.

## 2015-06-27 NOTE — Patient Instructions (Signed)
Continue weighing daily and call for an overnight weight gain of > 2 pounds or a weekly weight gain of >5 pounds. 

## 2015-06-28 ENCOUNTER — Encounter: Payer: Self-pay | Admitting: Internal Medicine

## 2015-06-28 ENCOUNTER — Ambulatory Visit (INDEPENDENT_AMBULATORY_CARE_PROVIDER_SITE_OTHER): Payer: Medicare Other | Admitting: Internal Medicine

## 2015-06-28 VITALS — BP 136/82 | HR 88 | Temp 98.8°F | Wt 188.0 lb

## 2015-06-28 DIAGNOSIS — M159 Polyosteoarthritis, unspecified: Secondary | ICD-10-CM

## 2015-06-28 DIAGNOSIS — M25511 Pain in right shoulder: Secondary | ICD-10-CM

## 2015-06-28 DIAGNOSIS — M150 Primary generalized (osteo)arthritis: Secondary | ICD-10-CM | POA: Diagnosis not present

## 2015-06-28 DIAGNOSIS — M8949 Other hypertrophic osteoarthropathy, multiple sites: Secondary | ICD-10-CM

## 2015-06-28 MED ORDER — HYDROCODONE-ACETAMINOPHEN 5-325 MG PO TABS: 1.0000 | ORAL_TABLET | Freq: Three times a day (TID) | ORAL | Status: DC

## 2015-06-28 NOTE — Patient Instructions (Signed)
Shoulder Range of Motion Exercises Shoulder range of motion (ROM) exercises are designed to keep the shoulder moving freely. They are often recommended for people who have shoulder pain. MOVEMENT EXERCISE When you are able, do this exercise 5-6 days per week, or as told by your health care provider. Work toward doing 2 sets of 10 swings. Pendulum Exercise How To Do This Exercise Lying Down 1. Lie face-down on a bed with your abdomen close to the side of the bed. 2. Let your arm hang over the side of the bed. 3. Relax your shoulder, arm, and hand. 4. Slowly and gently swing your arm forward and back. Do not use your neck muscles to swing your arm. They should be relaxed. If you are struggling to swing your arm, have someone gently swing it for you. When you do this exercise for the first time, swing your arm at a 15 degree angle for 15 seconds, or swing your arm 10 times. As pain lessens over time, increase the angle of the swing to 30-45 degrees. 5. Repeat steps 1-4 with the other arm. How To Do This Exercise While Standing 1. Stand next to a sturdy chair or table and hold on to it with your hand.  Bend forward at the waist.  Bend your knees slightly.  Relax your other arm and let it hang limp.  Relax the shoulder blade of the arm that is hanging and let it drop.  While keeping your shoulder relaxed, use body motion to swing your arm in small circles. The first time you do this exercise, swing your arm for about 30 seconds or 10 times. When you do it next time, swing your arm for a little longer.  Stand up tall and relax.  Repeat steps 1-7, this time changing the direction of the circles. 2. Repeat steps 1-8 with the other arm. STRETCHING EXERCISES Do these exercises 3-4 times per day on 5-6 days per week or as told by your health care provider. Work toward holding the stretch for 20 seconds. Stretching Exercise 1 1. Lift your arm straight out in front of you. 2. Bend your arm 90  degrees at the elbow (right angle) so your forearm goes across your body and looks like the letter "L." 3. Use your other arm to gently pull the elbow forward and across your body. 4. Repeat steps 1-3 with the other arm. Stretching Exercise 2 You will need a towel or rope for this exercise. 1. Bend one arm behind your back with the palm facing outward. 2. Hold a towel with your other hand. 3. Reach the arm that holds the towel above your head, and bend that arm at the elbow. Your wrist should be behind your neck. 4. Use your free hand to grab the free end of the towel. 5. With the higher hand, gently pull the towel up behind you. 6. With the lower hand, pull the towel down behind you. 7. Repeat steps 1-6 with the other arm. STRENGTHENING EXERCISES Do each of these exercises at four different times of day (sessions) every day or as told by your health care provider. To begin with, repeat each exercise 5 times (repetitions). Work toward doing 3 sets of 12 repetitions or as told by your health care provider. Strengthening Exercise 1 You will need a light weight for this activity. As you grow stronger, you may use a heavier weight. 1. Standing with a weight in your hand, lift your arm straight out to the side   until it is at the same height as your shoulder. 2. Bend your arm at 90 degrees so that your fingers are pointing to the ceiling. 3. Slowly raise your hand until your arm is straight up in the air. 4. Repeat steps 1-3 with the other arm. Strengthening Exercise 2 You will need a light weight for this activity. As you grow stronger, you may use a heavier weight. 1. Standing with a weight in your hand, gradually move your straight arm in an arc, starting at your side, then out in front of you, then straight up over your head. 2. Gradually move your other arm in an arc, starting at your side, then out in front of you, then straight up over your head. 3. Repeat steps 1-2 with the other  arm. Strengthening Exercise 3 You will need an elastic band for this activity. As you grow stronger, gradually increase the size of the bands or increase the number of bands that you use at one time. 1. While standing, hold an elastic band in one hand and raise that arm up in the air. 2. With your other hand, pull down the band until that hand is by your side. 3. Repeat steps 1-2 with the other arm.   This information is not intended to replace advice given to you by your health care provider. Make sure you discuss any questions you have with your health care provider.   Document Released: 12/09/2002 Document Revised: 07/27/2014 Document Reviewed: 03/08/2014 Elsevier Interactive Patient Education 2016 [employer]  

## 2015-06-28 NOTE — Progress Notes (Signed)
Subjective:    Patient ID: Travis [name], male    DOB: [birth year], 60 y.o.   MRN: [id number]  HPI  Pt presents to the clinic today with c/o right shoulder pain. This has been a chronic issue for at least 2 years, but worse in the last 5 days. He describes the pain as a  " constant ache" but it can be "stabbing" with any movement of his right arm. He does have associated weakness and numbness in his right arm.  He denies precipitating event, trauma, falls, or injury. He has a h/o arthritis in multiple joints and a h/o rotator cuff injury of his right shoulder. Last right shoulder xray was 01/2014 and has been reviewed. He has been taking Tramadol without much relief.    Review of Systems  Past Medical History  Diagnosis Date  . Chronic systolic CHF (congestive heart failure) ([place])     a. EF 20-25% by echo 12/28/10; b improved to 45-50% on echo in June 2015  . Nonischemic cardiomyopathy ([place])     minimal coronary disease by cath 12/29/10  . Hemoptysis     secondary to pulmonary edema  . Hyperlipidemia   . Chronic anemia   . GI bleed     15 years ago  . Tobacco abuse   . Alcohol abuse   . Osteoarthritis   . Gout   . Diabetes mellitus, type 2 ([place])     pt reports his DM is gone  . 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 Coronary artery disease     Cardiac catheterization in November 2016 showed mild 30% proximal LAD stenosis with no evidence of obstructive disease.  . 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 albuterol (PROVENTIL HFA;VENTOLIN HFA) 108 (90 BASE) MCG/ACT inhaler Inhale 2 puffs into the lungs every 6 (six) hours as needed for wheezing or shortness of breath. Take 2 puffs every 5-10 minutes up to 6 puffs total over 15 minutes when needed.  Use with a spacer. 1 Inhaler 2  . allopurinol (ZYLOPRIM) 100 MG tablet Take 100 mg by mouth daily.    [name]  aspirin 81 MG tablet Take 81 mg by mouth daily.    . carvedilol (COREG) 6.25 MG tablet Take 1 tablet (6.25 mg total) by mouth 2 (two) times daily with a meal. 60 tablet 5  . fenofibrate 54 MG tablet Take 1 tablet (54 mg total) by mouth daily. 30 tablet 6  . folic acid (FOLVITE) 1 MG tablet Take 1 tablet (1 mg total) by mouth daily. 30 tablet 6  . Multiple Vitamin (ONE-A-DAY MENS PO) Take 1 tablet by mouth daily.     . Omega-3 Fatty Acids (FISH OIL PO) Take 1 capsule by mouth daily.    . sacubitril-valsartan (ENTRESTO) 24-26 MG Take 1 tablet by mouth 2 (two) times daily. 60 tablet 5  . spironolactone (ALDACTONE) 25 MG tablet Take 0.5 tablets (12.5 mg total) by mouth daily. 30 tablet 3  . traMADol (ULTRAM) 50 MG tablet Take 1 tablet (50 mg total) by mouth 2 (two) times daily. 60 tablet 0  . HYDROcodone-acetaminophen (NORCO/VICODIN) 5-325 MG tablet Take 1 tablet by mouth every 8 (eight) hours. 30 tablet 0   No current facility-administered medications for this visit.    No Known Allergies  Family History  Problem Relation Age of Onset  . Diabetes Mother   . Hypertension Mother   .  Diabetes Father   . Hypertension Father   . Cancer Neg Hx   . Heart disease Neg Hx   . Stroke Neg Hx     Social History   Social History  . Marital Status: Single    Spouse Name: N/A  . Number of Children: N/A  . Years of Education: N/A   Occupational History  . Not on file.   Social History Main Topics  . Smoking status: Current Every Day Smoker -- 0.25 packs/day for 30 years    Types: Cigarettes  . Smokeless tobacco: Never Used  . Alcohol Use: 0.6 oz/week    1 Glasses of wine per week     Comment: occasionally  . Drug Use: Yes    Special: Marijuana     Comment: 1 blunt 1-2 times a month  . Sexual Activity: Yes   Other Topics Concern  . Not on file   Social History Narrative     Constitutional: Denies abrupt weight changes. Respiratory: Denies difficulty breathing, shortness of breath   Cardiovascular: Denies chest pain, chest tightness, palpitations or swelling in the hands or feet.  Musculoskeletal: Pt reports joint and muscle pain. Denies joint swelling. Neurological: Pt reports numbness and tingling in his right arm. Denies difficulty with balance, coordination.  No other specific complaints in a complete review of systems (except as listed in HPI above).     Objective:   Physical Exam BP 136/82 mmHg  Pulse 88  Temp(Src) 98.8 F (37.1 C) (Oral)  Wt 188 lb (85.276 kg)  SpO2 97% Wt Readings from Last 3 Encounters:  06/28/15 188 lb (85.276 kg)  06/27/15 190 lb (86.183 kg)  05/23/15 186 lb 8 oz (84.596 kg)    General: Appears his stated age, chronically ill appearing, in NAD. Skin: Warm, dry and intact.  Cardiovascular: Normal rate and rhythm. Radial pulses 2+ bilaterally Pulmonary/Chest: Normal effort and positive vesicular breath sounds. No respiratory distress. No wheezes, rales or ronchi noted.  Musculoskeletal: Decreased ROM of right shoulder with internal and external rotation and abduction. Full ROM of L shoulder. 4/5 grip strength on R, 5/5 on L. TTP of joint space and surround musculature of right shoulder. Positive drop can test on the right. Neuro: Sensation intact to BUE.   BMET    Component Value Date/Time   NA 140 05/23/2015 1038   NA 140 01/28/2015 0527   NA 141 04/20/2014 1019   K 4.5 05/23/2015 1038   K 3.7 04/20/2014 1019   CL 100 05/23/2015 1038   CL 106 04/20/2014 1019   CO2 22 05/23/2015 1038   CO2 30 04/20/2014 1019   GLUCOSE 130* 05/23/2015 1038   GLUCOSE 115* 01/28/2015 0527   GLUCOSE 102* 04/20/2014 1019   BUN 18 05/23/2015 1038   BUN 29* 01/28/2015 0527   BUN 14 04/20/2014 1019   CREATININE 0.88 05/23/2015 1038   CREATININE 1.18 04/20/2014 1019   CALCIUM 9.1 05/23/2015 1038   CALCIUM 9.3 04/20/2014 1019   GFRNONAA 94 05/23/2015 1038   GFRNONAA >60 04/20/2014 1019   GFRNONAA >60 11/19/2012 1107   GFRAA 109 05/23/2015  1038   GFRAA >60 04/20/2014 1019   GFRAA >60 11/19/2012 1107    Lipid Panel     Component Value Date/Time   CHOL 153 12/31/2013 1403   TRIG 221.0* 12/31/2013 1403   HDL 28.70* 12/31/2013 1403   CHOLHDL 5 12/31/2013 1403   VLDL 44.2* 12/31/2013 1403   LDLCALC UNABLE TO CALCULATE IF TRIGLYCERIDE OVER 400 mg/dL  09/14/2013 0510    CBC    Component Value Date/Time   WBC 9.1 01/28/2015 0527   WBC 7.9 04/20/2014 1019   RBC 4.23* 01/28/2015 0527   RBC 4.65 04/20/2014 1019   RBC 2.93* 12/28/2010 0345   HGB 13.0 01/28/2015 0527   HGB 13.5 04/20/2014 1019   HCT 40.3 01/28/2015 0527   HCT 41.8 04/20/2014 1019   PLT 221 01/28/2015 0527   PLT 348 04/20/2014 1019   MCV 95.2 01/28/2015 0527   MCV 90 04/20/2014 1019   MCH 30.8 01/28/2015 0527   MCH 29.1 04/20/2014 1019   MCHC 32.4 01/28/2015 0527   MCHC 32.4 04/20/2014 1019   RDW 15.1* 01/28/2015 0527   RDW 16.2* 04/20/2014 1019   LYMPHSABS 1.5 01/26/2015 1020   LYMPHSABS 2.1 04/20/2014 1019   MONOABS 0.3 01/26/2015 1020   MONOABS 0.5 04/20/2014 1019   EOSABS 0.9* 01/26/2015 1020   EOSABS 0.3 04/20/2014 1019   BASOSABS 0.0 01/26/2015 1020   BASOSABS 0.1 04/20/2014 1019   BASOSABS 0 06/13/2012 1515    Hgb A1C Lab Results  Component Value Date   HGBA1C 6.6* 12/31/2013         Assessment & Plan:   Right shoulder pain  Continue using heat for pain control Norco 5-325 mg tabsq8 hrs for pain Shoulder exercises as described in handout MRI of right shoulder Referral to ortho for further evaluation and treatment  Will follow up after MRI, RTC as needed or if symptoms persist or worsen  Leavy Heatherly, NP

## 2015-06-28 NOTE — Progress Notes (Signed)
Pre visit review using our clinic review tool, if applicable. No additional management support is needed unless otherwise documented below in the visit note. 

## 2015-06-29 DIAGNOSIS — H2511 Age-related nuclear cataract, right eye: Secondary | ICD-10-CM | POA: Diagnosis not present

## 2015-06-29 DIAGNOSIS — H538 Other visual disturbances: Secondary | ICD-10-CM | POA: Diagnosis not present

## 2015-06-29 DIAGNOSIS — H3410 Central retinal artery occlusion, unspecified eye: Secondary | ICD-10-CM | POA: Diagnosis not present

## 2015-06-29 LAB — HM DIABETES EYE EXAM

## 2015-06-30 ENCOUNTER — Ambulatory Visit
Admission: RE | Admit: 2015-06-30 | Discharge: 2015-06-30 | Disposition: A | Discharge status: Home / Self Care | Payer: Medicare Other | Source: Ambulatory Visit | Attending: Internal Medicine | Admitting: Internal Medicine

## 2015-06-30 DIAGNOSIS — M19011 Primary osteoarthritis, right shoulder: Secondary | ICD-10-CM | POA: Insufficient documentation

## 2015-06-30 DIAGNOSIS — M7581 Other shoulder lesions, right shoulder: Secondary | ICD-10-CM | POA: Insufficient documentation

## 2015-06-30 DIAGNOSIS — M25511 Pain in right shoulder: Secondary | ICD-10-CM | POA: Diagnosis not present

## 2015-07-04 DIAGNOSIS — G8929 Other chronic pain: Secondary | ICD-10-CM | POA: Diagnosis not present

## 2015-07-04 DIAGNOSIS — M25562 Pain in left knee: Secondary | ICD-10-CM | POA: Diagnosis not present

## 2015-07-04 DIAGNOSIS — M25561 Pain in right knee: Secondary | ICD-10-CM | POA: Diagnosis not present

## 2015-07-04 DIAGNOSIS — M19011 Primary osteoarthritis, right shoulder: Secondary | ICD-10-CM | POA: Diagnosis not present

## 2015-07-27 ENCOUNTER — Ambulatory Visit: Payer: Medicare Other | Attending: Family | Admitting: Family

## 2015-07-27 ENCOUNTER — Encounter: Payer: Self-pay | Admitting: Family

## 2015-07-27 VITALS — BP 129/77 | HR 84 | Resp 20 | Ht 76.0 in | Wt 191.0 lb

## 2015-07-27 DIAGNOSIS — I5022 Chronic systolic (congestive) heart failure: Secondary | ICD-10-CM | POA: Diagnosis not present

## 2015-07-27 DIAGNOSIS — M109 Gout, unspecified: Secondary | ICD-10-CM | POA: Diagnosis not present

## 2015-07-27 DIAGNOSIS — Z9889 Other specified postprocedural states: Secondary | ICD-10-CM | POA: Insufficient documentation

## 2015-07-27 DIAGNOSIS — I426 Alcoholic cardiomyopathy: Secondary | ICD-10-CM | POA: Insufficient documentation

## 2015-07-27 DIAGNOSIS — Z7982 Long term (current) use of aspirin: Secondary | ICD-10-CM | POA: Diagnosis not present

## 2015-07-27 DIAGNOSIS — R0602 Shortness of breath: Secondary | ICD-10-CM | POA: Insufficient documentation

## 2015-07-27 DIAGNOSIS — I110 Hypertensive heart disease with heart failure: Secondary | ICD-10-CM | POA: Diagnosis not present

## 2015-07-27 DIAGNOSIS — E119 Type 2 diabetes mellitus without complications: Secondary | ICD-10-CM | POA: Insufficient documentation

## 2015-07-27 DIAGNOSIS — F17210 Nicotine dependence, cigarettes, uncomplicated: Secondary | ICD-10-CM | POA: Insufficient documentation

## 2015-07-27 DIAGNOSIS — M1990 Unspecified osteoarthritis, unspecified site: Secondary | ICD-10-CM | POA: Insufficient documentation

## 2015-07-27 DIAGNOSIS — I2510 Atherosclerotic heart disease of native coronary artery without angina pectoris: Secondary | ICD-10-CM | POA: Diagnosis not present

## 2015-07-27 DIAGNOSIS — F17200 Nicotine dependence, unspecified, uncomplicated: Secondary | ICD-10-CM

## 2015-07-27 DIAGNOSIS — I10 Essential (primary) hypertension: Secondary | ICD-10-CM

## 2015-07-27 DIAGNOSIS — E785 Hyperlipidemia, unspecified: Secondary | ICD-10-CM | POA: Diagnosis not present

## 2015-07-27 DIAGNOSIS — I429 Cardiomyopathy, unspecified: Secondary | ICD-10-CM | POA: Insufficient documentation

## 2015-07-27 DIAGNOSIS — Z79899 Other long term (current) drug therapy: Secondary | ICD-10-CM | POA: Diagnosis not present

## 2015-07-27 MED ORDER — CARVEDILOL 6.25 MG PO TABS: 12.5000 mg | ORAL_TABLET | Freq: Two times a day (BID) | ORAL | Status: DC

## 2015-07-27 NOTE — Progress Notes (Signed)
Subjective:    Patient ID: Travis [name], male    DOB: [birth year], 60 y.o.   MRN: [id number]  Congestive Heart Failure Presents for follow-up visit. The disease course has been stable. Associated symptoms include fatigue and shortness of breath. Pertinent negatives include no abdominal pain, chest pain, chest pressure, edema, orthopnea or palpitations. The symptoms have been stable. Past treatments include angiotensin receptor blockers, aldosterone receptor blockers, beta blockers and salt and fluid restriction. The treatment provided moderate relief. Compliance with prior treatments has been good. His past medical history is significant for DM and HTN. There is no history of chronic lung disease.  Hypertension This is a chronic problem. The current episode started more than 1 year ago. The problem is unchanged. The problem is controlled. Associated symptoms include shortness of breath. Pertinent negatives include no chest pain, neck pain, palpitations or peripheral edema. There are no associated agents to hypertension. Risk factors for coronary artery disease include diabetes mellitus, dyslipidemia, male gender and smoking/tobacco exposure. Past treatments include angiotensin blockers, beta blockers, diuretics and lifestyle changes. The current treatment provides significant improvement. There are no compliance problems.  Hypertensive end-organ damage includes heart failure. There is no history of kidney disease or CAD/MI.   Past Medical History  Diagnosis Date  . Chronic systolic CHF (congestive heart failure) ([place])     a. EF 20-25% by echo 12/28/10; b improved to 45-50% on echo in June 2015  . Nonischemic cardiomyopathy ([place])     minimal coronary disease by cath 12/29/10  . Hemoptysis     secondary to pulmonary edema  . Hyperlipidemia   . Chronic anemia   . GI bleed     15 years ago  . Tobacco abuse   . Alcohol abuse   . Osteoarthritis   . Gout   . Diabetes mellitus, type 2 ([place])      pt reports his DM is gone  . 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 Coronary artery disease     Cardiac catheterization in November 2016 showed mild 30% proximal LAD stenosis with no evidence of obstructive disease.  . 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 Cardiac catheterization    . Hip arthroplasty Right 03/15/2013    Procedure: ARTHROPLASTY BIPOLAR HIP;  Surgeon: [name], MD;  Location: [facility];  Service: Orthopedics;  Laterality: Right;  . Cardiac catheterization N/A 01/28/2015    Procedure: Left Heart Cath and Coronary Angiography;  Surgeon: Wellington Hampshire, MD;  Location: [facility];  Service: Cardiovascular;  Laterality: N/A;    Family History  Problem Relation Age of Onset  . Diabetes Mother   . Hypertension Mother   . Diabetes Father   . Hypertension Father   . Cancer Neg Hx   . Heart disease Neg Hx   . Stroke Neg Hx     Social History  Substance Use Topics  . Smoking status: Current Every Day Smoker -- 0.25 packs/day for 30 years    Types: Cigarettes  . Smokeless tobacco: Never Used  . Alcohol Use: 0.6 oz/week    1 Glasses of wine per week     Comment: occasionally    No Known Allergies  Prior to Admission medications   Medication Sig Start Date End Date Taking? Authorizing Provider  albuterol (PROVENTIL HFA;VENTOLIN HFA) 108 (90 BASE) MCG/ACT inhaler Inhale 2 puffs into the lungs every 6 (six)  hours as needed for wheezing or shortness of breath. Take 2 puffs every 5-10 minutes up to 6 puffs total over 15 minutes when needed.  Use with a spacer. 01/24/15  Yes Ahmed Prima, MD  allopurinol (ZYLOPRIM) 100 MG tablet Take 100 mg by mouth daily.   Yes Historical Provider, MD  aspirin 81 MG tablet Take 81 mg by mouth daily.   Yes Historical Provider, MD  carvedilol (COREG) 6.25 MG tablet Take 2 tablets (12.5 mg total) by mouth 2  (two) times daily with a meal. 07/27/15  Yes Alisa Graff, FNP  fenofibrate 54 MG tablet Take 1 tablet (54 mg total) by mouth daily. 12/28/14  Yes Amy D Clegg, NP  folic acid (FOLVITE) 1 MG tablet Take 1 tablet (1 mg total) by mouth daily. 12/28/14  Yes Amy D Clegg, NP  HYDROcodone-acetaminophen (NORCO/VICODIN) 5-325 MG tablet Take 1 tablet by mouth every 8 (eight) hours. 06/28/15  Yes [name], NP  Multiple Vitamin (ONE-A-DAY MENS PO) Take 1 tablet by mouth daily.    Yes Historical Provider, MD  Omega-3 Fatty Acids (FISH OIL PO) Take 1 capsule by mouth daily.   Yes Historical Provider, MD  sacubitril-valsartan (ENTRESTO) 24-26 MG Take 1 tablet by mouth 2 (two) times daily. 04/18/15  Yes Alisa Graff, FNP  spironolactone (ALDACTONE) 25 MG tablet Take 0.5 tablets (12.5 mg total) by mouth daily. 05/23/15  Yes Wellington Hampshire, MD  traMADol (ULTRAM) 50 MG tablet Take 1 tablet (50 mg total) by mouth 2 (two) times daily. 05/16/15  Yes [name], NP      Review of Systems  Constitutional: Positive for fatigue. Negative for appetite change.  HENT: Positive for congestion. Negative for postnasal drip and sore throat.   Eyes: Negative.   Respiratory: Positive for cough and shortness of breath. Negative for chest tightness.   Cardiovascular: Negative for chest pain, palpitations and leg swelling.  Gastrointestinal: Negative for abdominal pain and abdominal distention.  Endocrine: Negative.   Genitourinary: Negative.   Musculoskeletal: Positive for arthralgias (right shoulder and both knees). Negative for neck pain.  Skin: Negative.   Allergic/Immunologic: Negative.   Neurological: Negative for dizziness, light-headedness and numbness.  Hematological: Negative for adenopathy. Does not bruise/bleed easily.  Psychiatric/Behavioral: Negative for sleep disturbance (sleeping on 2 pillows) and dysphoric mood. The patient is not nervous/anxious.        Objective:   Physical Exam  Constitutional:  He is oriented to person, place, and time. He appears well-developed and well-nourished.  HENT:  Head: Normocephalic and atraumatic.  Eyes: Conjunctivae are normal. Pupils are equal, round, and reactive to light.  Neck: Normal range of motion. Neck supple.  Cardiovascular: Normal rate and regular rhythm.   Pulmonary/Chest: Effort normal. He has no wheezes. He has no rales.  Abdominal: Soft. He exhibits no distension. There is no tenderness.  Musculoskeletal: He exhibits no edema or tenderness.  Neurological: He is alert and oriented to person, place, and time.  Skin: Skin is warm and dry.  Psychiatric: He has a normal mood and affect. His behavior is normal. Thought content normal.  Nursing note and vitals reviewed.   BP 129/77 mmHg  Pulse 84  Resp 20  Ht 6\' 4"  (1.93 m)  Wt 191 lb (86.637 kg)  BMI 23.26 kg/m2  SpO2 99%       Assessment & Plan:  1: Chronic heart failure with reduced ejection fraction- Patient presents with fatigue and shortness of breath upon exertion (Class  II) which improves with rest. He denied having symptoms upon walking into the office today. He denies any swelling in his legs or abdomen. He says that he hasn't been weighing himself daily and he was encouraged to resume weighing daily and to call for an overnight weight gain of >2 pounds or a weekly weight gain of >5 pounds. By our scale, he has gained only 1 pound since he was last here. He's not adding any additional salt to his food and tries to eat low sodium foods. Will increase his carvedilol to 12.5mg  twice daily. He was instructed that he could use up his 6.25mg  tablets by taking 2 of them twice daily until they are gone. He also mentions that he has the 3.125mg  dose at home and explained that he could also use them up by taking 4 of them twice daily. Would like him to use up what he has so that he doesn't get confused. Written instructions were also provided regarding this and he was able to verbalize  understanding. [facility] PhamD went in and reviewed medications with the patient.  2: HTN- Blood pressure looks good today. 3: Tobacco use- Patient continues to smoke about 1/2 ppd of cigarettes and says that he's working on stopping. He says that he tends to smoke first thing in the morning as well as after meals. Complete cessation was discussed for 3 minutes with him.  Patient did not bring his medications nor a list. Each medication was verbally reviewed with the patient and he was encouraged to bring the bottles to every visit to confirm accuracy of list.  Return in 1 month or sooner for any questions/problems before then.

## 2015-07-27 NOTE — Patient Instructions (Addendum)
Begin weighing daily and call for an overnight weight gain of > 2 pounds or a weekly weight gain of >5 pounds.  Increase carvedilol (coreg) to 12.5mg  twice daily. You can finish your current supply by taking 2 of the 6.25mg  tablets twice daily OR 4 of the 3.125mg  tablets twice daily until they are all gone. Before you run out, call the office so that a 12.5mg  tablet can be called in.    Smoking Cessation Quitting smoking is important to your health and has many advantages. However, it is not always easy to quit since nicotine is a very addictive drug. Oftentimes, people try 3 times or more before being able to quit. This document explains the best ways for you to prepare to quit smoking. Quitting takes hard work and a lot of effort, but you can do it. ADVANTAGES OF QUITTING SMOKING  You will live longer, feel better, and live better.  Your body will feel the impact of quitting smoking almost immediately.  Within 20 minutes, blood pressure decreases. Your pulse returns to its normal level.  After 8 hours, carbon monoxide levels in the blood return to normal. Your oxygen level increases.  After 24 hours, the chance of having a heart attack starts to decrease. Your breath, hair, and body stop smelling like smoke.  After 48 hours, damaged nerve endings begin to recover. Your sense of taste and smell improve.  After 72 hours, the body is virtually free of nicotine. Your bronchial tubes relax and breathing becomes easier.  After 2 to 12 weeks, lungs can hold more air. Exercise becomes easier and circulation improves.  The risk of having a heart attack, stroke, cancer, or lung disease is greatly reduced.  After 1 year, the risk of coronary heart disease is cut in half.  After 5 years, the risk of stroke falls to the same as a nonsmoker.  After 10 years, the risk of lung cancer is cut in half and the risk of other cancers decreases significantly.  After 15 years, the risk of coronary heart  disease drops, usually to the level of a nonsmoker.  If you are pregnant, quitting smoking will improve your chances of having a healthy baby.  The people you live with, especially any children, will be healthier.  You will have extra money to spend on things other than cigarettes. QUESTIONS TO THINK ABOUT BEFORE ATTEMPTING TO QUIT You may want to talk about your answers with your health care provider.  Why do you want to quit?  If you tried to quit in the past, what helped and what did not?  What will be the most difficult situations for you after you quit? How will you plan to handle them?  Who can help you through the tough times? Your family? Friends? A health care provider?  What pleasures do you get from smoking? What ways can you still get pleasure if you quit? Here are some questions to ask your health care provider:  How can you help me to be successful at quitting?  What medicine do you think would be best for me and how should I take it?  What should I do if I need more help?  What is smoking withdrawal like? How can I get information on withdrawal? GET READY  Set a quit date.  Change your environment by getting rid of all cigarettes, ashtrays, matches, and lighters in your home, car, or work. Do not let people smoke in your home.  Review your past  attempts to quit. Think about what worked and what did not. GET SUPPORT AND ENCOURAGEMENT You have a better chance of being successful if you have help. You can get support in many ways.  Tell your family, friends, and coworkers that you are going to quit and need their support. Ask them not to smoke around you.  Get individual, group, or telephone counseling and support. Programs are available at [employer] and health centers. Call your local health department for information about programs in your area.  Spiritual beliefs and practices may help some smokers quit.  Download a "quit meter" on your computer to  keep track of quit statistics, such as how long you have gone without smoking, cigarettes not smoked, and money saved.  Get a self-help book about quitting smoking and staying off tobacco. [place] yourself from urges to smoke. Talk to someone, go for a walk, or occupy your time with a task.  Change your normal routine. Take a different route to work. Drink tea instead of coffee. Eat breakfast in a different place.  Reduce your stress. Take a hot bath, exercise, or read a book.  Plan something enjoyable to do every day. Reward yourself for not smoking.  Explore interactive web-based programs that specialize in helping you quit. GET MEDICINE AND USE IT CORRECTLY Medicines can help you stop smoking and decrease the urge to smoke. Combining medicine with the above behavioral methods and support can greatly increase your chances of successfully quitting smoking.  Nicotine replacement therapy helps deliver nicotine to your body without the negative effects and risks of smoking. Nicotine replacement therapy includes nicotine gum, lozenges, inhalers, nasal sprays, and skin patches. Some may be available over-the-counter and others require a prescription.  Antidepressant medicine helps people abstain from smoking, but how this works is unknown. This medicine is available by prescription.  Nicotinic receptor partial agonist medicine simulates the effect of nicotine in your brain. This medicine is available by prescription. Ask your health care provider for advice about which medicines to use and how to use them based on your health history. Your health care provider will tell you what side effects to look out for if you choose to be on a medicine or therapy. Carefully read the information on the package. Do not use any other product containing nicotine while using a nicotine replacement product.  RELAPSE OR DIFFICULT SITUATIONS Most relapses occur within the first 3  months after quitting. Do not be discouraged if you start smoking again. Remember, most people try several times before finally quitting. You may have symptoms of withdrawal because your body is used to nicotine. You may crave cigarettes, be irritable, feel very hungry, cough often, get headaches, or have difficulty concentrating. The withdrawal symptoms are only temporary. They are strongest when you first quit, but they will go away within 10-14 days. To reduce the chances of relapse, try to:  Avoid drinking alcohol. Drinking lowers your chances of successfully quitting.  Reduce the amount of caffeine you consume. Once you quit smoking, the amount of caffeine in your body increases and can give you symptoms, such as a rapid heartbeat, sweating, and anxiety.  Avoid smokers because they can make you want to smoke.  Do not let weight gain distract you. Many smokers will gain weight when they quit, usually less than 10 pounds. Eat a healthy diet and stay active. You can always lose the weight gained after you quit.  Find ways to improve  your mood other than smoking. FOR MORE INFORMATION  www.smokefree.gov  Document Released: 03/06/2001 Document Revised: 07/27/2013 Document Reviewed: 06/21/2011 [facility] Patient Information 2015 [place], [place]. This information is not intended to replace advice given to you by your health care provider. Make sure you discuss any questions you have with your health care provider.

## 2015-08-02 ENCOUNTER — Other Ambulatory Visit (HOSPITAL_COMMUNITY): Payer: Self-pay | Admitting: *Deleted

## 2015-08-02 MED ORDER — FENOFIBRATE 54 MG PO TABS: 54.0000 mg | ORAL_TABLET | Freq: Every day | ORAL | Status: DC

## 2015-08-02 MED ORDER — FOLIC ACID 1 MG PO TABS: 1.0000 mg | ORAL_TABLET | Freq: Every day | ORAL | Status: DC

## 2015-08-17 DIAGNOSIS — M25561 Pain in right knee: Secondary | ICD-10-CM | POA: Diagnosis not present

## 2015-08-17 DIAGNOSIS — M25562 Pain in left knee: Secondary | ICD-10-CM | POA: Diagnosis not present

## 2015-08-17 DIAGNOSIS — M1711 Unilateral primary osteoarthritis, right knee: Secondary | ICD-10-CM | POA: Diagnosis not present

## 2015-08-18 ENCOUNTER — Ambulatory Visit: Payer: Medicare Other | Admitting: Cardiovascular Disease

## 2015-08-18 DIAGNOSIS — M19011 Primary osteoarthritis, right shoulder: Secondary | ICD-10-CM | POA: Diagnosis not present

## 2015-08-24 ENCOUNTER — Ambulatory Visit: Payer: Medicare Other | Attending: Family | Admitting: Family

## 2015-08-24 ENCOUNTER — Encounter: Payer: Self-pay | Admitting: Family

## 2015-08-24 VITALS — BP 132/78 | HR 78 | Resp 18 | Ht 76.0 in | Wt 189.0 lb

## 2015-08-24 DIAGNOSIS — M1990 Unspecified osteoarthritis, unspecified site: Secondary | ICD-10-CM | POA: Insufficient documentation

## 2015-08-24 DIAGNOSIS — F17210 Nicotine dependence, cigarettes, uncomplicated: Secondary | ICD-10-CM | POA: Insufficient documentation

## 2015-08-24 DIAGNOSIS — I110 Hypertensive heart disease with heart failure: Secondary | ICD-10-CM | POA: Insufficient documentation

## 2015-08-24 DIAGNOSIS — E785 Hyperlipidemia, unspecified: Secondary | ICD-10-CM | POA: Diagnosis not present

## 2015-08-24 DIAGNOSIS — Z7982 Long term (current) use of aspirin: Secondary | ICD-10-CM | POA: Insufficient documentation

## 2015-08-24 DIAGNOSIS — I10 Essential (primary) hypertension: Secondary | ICD-10-CM

## 2015-08-24 DIAGNOSIS — M75101 Unspecified rotator cuff tear or rupture of right shoulder, not specified as traumatic: Secondary | ICD-10-CM | POA: Insufficient documentation

## 2015-08-24 DIAGNOSIS — I429 Cardiomyopathy, unspecified: Secondary | ICD-10-CM | POA: Insufficient documentation

## 2015-08-24 DIAGNOSIS — I426 Alcoholic cardiomyopathy: Secondary | ICD-10-CM | POA: Diagnosis not present

## 2015-08-24 DIAGNOSIS — Z823 Family history of stroke: Secondary | ICD-10-CM | POA: Diagnosis not present

## 2015-08-24 DIAGNOSIS — D649 Anemia, unspecified: Secondary | ICD-10-CM | POA: Diagnosis not present

## 2015-08-24 DIAGNOSIS — M109 Gout, unspecified: Secondary | ICD-10-CM | POA: Diagnosis not present

## 2015-08-24 DIAGNOSIS — Z833 Family history of diabetes mellitus: Secondary | ICD-10-CM | POA: Insufficient documentation

## 2015-08-24 DIAGNOSIS — F1010 Alcohol abuse, uncomplicated: Secondary | ICD-10-CM | POA: Diagnosis not present

## 2015-08-24 DIAGNOSIS — Z8249 Family history of ischemic heart disease and other diseases of the circulatory system: Secondary | ICD-10-CM | POA: Insufficient documentation

## 2015-08-24 DIAGNOSIS — I5022 Chronic systolic (congestive) heart failure: Secondary | ICD-10-CM | POA: Insufficient documentation

## 2015-08-24 DIAGNOSIS — Z955 Presence of coronary angioplasty implant and graft: Secondary | ICD-10-CM | POA: Insufficient documentation

## 2015-08-24 DIAGNOSIS — Z96641 Presence of right artificial hip joint: Secondary | ICD-10-CM | POA: Diagnosis not present

## 2015-08-24 DIAGNOSIS — I2510 Atherosclerotic heart disease of native coronary artery without angina pectoris: Secondary | ICD-10-CM | POA: Insufficient documentation

## 2015-08-24 DIAGNOSIS — R5383 Other fatigue: Secondary | ICD-10-CM | POA: Diagnosis not present

## 2015-08-24 DIAGNOSIS — Z79899 Other long term (current) drug therapy: Secondary | ICD-10-CM | POA: Insufficient documentation

## 2015-08-24 DIAGNOSIS — E119 Type 2 diabetes mellitus without complications: Secondary | ICD-10-CM | POA: Diagnosis not present

## 2015-08-24 DIAGNOSIS — F17200 Nicotine dependence, unspecified, uncomplicated: Secondary | ICD-10-CM

## 2015-08-24 NOTE — Progress Notes (Signed)
Subjective:    Patient ID: Travis [name], male    DOB: [birth year], 60 y.o.   MRN: [id number]  Congestive Heart Failure Presents for follow-up visit. The disease course has been stable. Associated symptoms include fatigue. Pertinent negatives include no abdominal pain, chest pain, chest pressure, edema, orthopnea, palpitations or shortness of breath. The symptoms have been stable. Past treatments include angiotensin receptor blockers, aldosterone receptor blockers, beta blockers and salt and fluid restriction. The treatment provided significant relief. Compliance with prior treatments has been good. His past medical history is significant for anemia, CAD, DM and HTN.  Hypertension This is a chronic problem. The current episode started more than 1 year ago. The problem is unchanged. The problem is controlled. Pertinent negatives include no chest pain, headaches, neck pain, palpitations, peripheral edema or shortness of breath. Agents associated with hypertension include NSAIDs. Risk factors for coronary artery disease include dyslipidemia, smoking/tobacco exposure and male gender. Past treatments include angiotensin blockers, beta blockers, diuretics and lifestyle changes. The current treatment provides significant improvement. There are no compliance problems.  Hypertensive end-organ damage includes heart failure.   Past Medical History  Diagnosis Date  . Chronic systolic CHF (congestive heart failure) ([place])     a. EF 20-25% by echo 12/28/10; b improved to 45-50% on echo in June 2015  . Nonischemic cardiomyopathy ([place])     minimal coronary disease by cath 12/29/10  . Hemoptysis     secondary to pulmonary edema  . Hyperlipidemia   . Chronic anemia   . GI bleed     15 years ago  . Tobacco abuse   . Alcohol abuse   . Osteoarthritis   . Gout   . Diabetes mellitus, type 2 ([place])     pt reports his DM is gone  . 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 Coronary artery disease     Cardiac  catheterization in November 2016 showed mild 30% proximal LAD stenosis with no evidence of obstructive disease.  . 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 Cardiac catheterization    . Hip arthroplasty Right 03/15/2013    Procedure: ARTHROPLASTY BIPOLAR HIP;  Surgeon: [name], MD;  Location: [facility];  Service: Orthopedics;  Laterality: Right;  . Cardiac catheterization N/A 01/28/2015    Procedure: Left Heart Cath and Coronary Angiography;  Surgeon: Wellington Hampshire, MD;  Location: [facility];  Service: Cardiovascular;  Laterality: N/A;    Family History  Problem Relation Age of Onset  . Diabetes Mother   . Hypertension Mother   . Diabetes Father   . Hypertension Father   . Cancer Neg Hx   . Heart disease Neg Hx   . Stroke Neg Hx     Social History  Substance Use Topics  . Smoking status: Current Every Day Smoker -- 0.25 packs/day for 30 years    Types: Cigarettes  . Smokeless tobacco: Never Used  . Alcohol Use: 0.6 oz/week    1 Glasses of wine per week     Comment: occasionally    No Known Allergies  Prior to Admission medications   Medication Sig Start Date End Date Taking? Authorizing Provider  allopurinol (ZYLOPRIM) 100 MG tablet Take 100 mg by mouth daily.   Yes Historical Provider, MD  aspirin 325 MG tablet Take 162.5 mg by mouth daily.   Yes Historical Provider, MD  carvedilol (COREG) 6.25 MG  tablet Take 2 tablets (12.5 mg total) by mouth 2 (two) times daily with a meal. 07/27/15  Yes Alisa Graff, FNP  fenofibrate 54 MG tablet Take 1 tablet (54 mg total) by mouth daily. 08/02/15  Yes Amy D Clegg, NP  Multiple Vitamin (ONE-A-DAY MENS PO) Take 1 tablet by mouth daily.    Yes Historical Provider, MD  naproxen (NAPROSYN) 500 MG tablet Take 500 mg by mouth 2 (two) times daily as needed.   Yes Historical Provider, MD  Omega-3 Fatty Acids (FISH OIL PO) Take  1 capsule by mouth daily.   Yes Historical Provider, MD  sacubitril-valsartan (ENTRESTO) 24-26 MG Take 1 tablet by mouth 2 (two) times daily. 04/18/15  Yes Alisa Graff, FNP  spironolactone (ALDACTONE) 25 MG tablet Take 0.5 tablets (12.5 mg total) by mouth daily. 05/23/15  Yes Wellington Hampshire, MD  traMADol (ULTRAM) 50 MG tablet Take 1 tablet (50 mg total) by mouth 2 (two) times daily. 05/16/15  Yes [name], NP  albuterol (PROVENTIL HFA;VENTOLIN HFA) 108 (90 BASE) MCG/ACT inhaler Inhale 2 puffs into the lungs every 6 (six) hours as needed for wheezing or shortness of breath. Take 2 puffs every 5-10 minutes up to 6 puffs total over 15 minutes when needed.  Use with a spacer. Patient not taking: Reported on 08/24/2015 01/24/15   Ahmed Prima, MD      Review of Systems  Constitutional: Positive for fatigue. Negative for appetite change.  HENT: Negative for congestion, rhinorrhea and sore throat.   Eyes: Negative.   Respiratory: Negative for cough, chest tightness and shortness of breath.   Cardiovascular: Negative for chest pain, palpitations and leg swelling.  Gastrointestinal: Negative for abdominal pain and abdominal distention.  Endocrine: Negative.   Genitourinary: Negative.   Musculoskeletal: Positive for back pain and arthralgias (right shoulder). Negative for neck pain.  Skin: Negative.   Allergic/Immunologic: Negative.   Neurological: Negative for dizziness, light-headedness and headaches.  Hematological: Negative for adenopathy. Does not bruise/bleed easily.  Psychiatric/Behavioral: Positive for sleep disturbance (due to shoulder pain). Negative for dysphoric mood. The patient is not nervous/anxious.        Objective:   Physical Exam  Constitutional: He is oriented to person, place, and time. He appears well-developed and well-nourished.  HENT:  Head: Normocephalic and atraumatic.  Eyes: Conjunctivae are normal. Pupils are equal, round, and reactive to light.  Neck:  Normal range of motion. Neck supple.  Cardiovascular: Normal rate and regular rhythm.   Pulmonary/Chest: Effort normal. He has no wheezes. He has no rales.  Abdominal: Soft. He exhibits no distension. There is no tenderness.  Musculoskeletal: He exhibits no edema.       Right shoulder: He exhibits decreased range of motion.  Neurological: He is alert and oriented to person, place, and time.  Skin: Skin is warm and dry.  Psychiatric: He has a normal mood and affect. His behavior is normal. Thought content normal.  Nursing note and vitals reviewed.   BP 132/78 mmHg  Pulse 78  Resp 18  Ht 6\' 4"  (1.93 m)  Wt 189 lb (85.73 kg)  BMI 23.02 kg/m2  SpO2 100%       Assessment & Plan:  1: Chronic heart failure with reduced ejection fraction- Patient presents with a mild amount of fatigue upon exertion (Class II). He denies any shortness of breath or swelling in his legs/abdomen. He continues to weigh himself and reports a stable weight. By our scale, he's lost 2 pounds  since he was last here on 07/27/15. Reminded to call for an overnight weight gain of >2 pounds or a weekly weight gain of >5 pounds. He is not adding any salt to his food and is trying to eat low sodium foods. He has tolerated the increase of his carvedilol to 12.5mg  twice daily. Reminded him to call when he gets close to running out of the lower doses so the 12.5mg  dosage can be called in. Could consider titrating up more but he will be having surgery next week so will hold off. [facility] PharmD went in and reviewed medications with the patient.  2: HTN- Blood pressure looks good today. Continue medications. 3: Tobacco use- Patient continues to smoke but says that he's trying to decrease his usage. He says that he's smoking about 1/2 ppd. Complete cessation was discussed for 3 minutes with him. 4: Right rotator cuff- He says that he's currently scheduled for surgery on 09/01/15.  Reviewed medication bottles with the patient.  Return here  in 3 months or sooner for any questions/problems before then.

## 2015-08-24 NOTE — Patient Instructions (Signed)
Continue weighing daily and call for an overnight weight gain of > 2 pounds or a weekly weight gain of >5 pounds.    Smoking Cessation Quitting smoking is important to your health and has many advantages. However, it is not always easy to quit since nicotine is a very addictive drug. Oftentimes, people try 3 times or more before being able to quit. This document explains the best ways for you to prepare to quit smoking. Quitting takes hard work and a lot of effort, but you can do it. ADVANTAGES OF QUITTING SMOKING  You will live longer, feel better, and live better.  Your body will feel the impact of quitting smoking almost immediately.  Within 20 minutes, blood pressure decreases. Your pulse returns to its normal level.  After 8 hours, carbon monoxide levels in the blood return to normal. Your oxygen level increases.  After 24 hours, the chance of having a heart attack starts to decrease. Your breath, hair, and body stop smelling like smoke.  After 48 hours, damaged nerve endings begin to recover. Your sense of taste and smell improve.  After 72 hours, the body is virtually free of nicotine. Your bronchial tubes relax and breathing becomes easier.  After 2 to 12 weeks, lungs can hold more air. Exercise becomes easier and circulation improves.  The risk of having a heart attack, stroke, cancer, or lung disease is greatly reduced.  After 1 year, the risk of coronary heart disease is cut in half.  After 5 years, the risk of stroke falls to the same as a nonsmoker.  After 10 years, the risk of lung cancer is cut in half and the risk of other cancers decreases significantly.  After 15 years, the risk of coronary heart disease drops, usually to the level of a nonsmoker.  If you are pregnant, quitting smoking will improve your chances of having a healthy baby.  The people you live with, especially any children, will be healthier.  You will have extra money to spend on things other  than cigarettes. QUESTIONS TO THINK ABOUT BEFORE ATTEMPTING TO QUIT You may want to talk about your answers with your health care provider.  Why do you want to quit?  If you tried to quit in the past, what helped and what did not?  What will be the most difficult situations for you after you quit? How will you plan to handle them?  Who can help you through the tough times? Your family? Friends? A health care provider?  What pleasures do you get from smoking? What ways can you still get pleasure if you quit? Here are some questions to ask your health care provider:  How can you help me to be successful at quitting?  What medicine do you think would be best for me and how should I take it?  What should I do if I need more help?  What is smoking withdrawal like? How can I get information on withdrawal? GET READY  Set a quit date.  Change your environment by getting rid of all cigarettes, ashtrays, matches, and lighters in your home, car, or work. Do not let people smoke in your home.  Review your past attempts to quit. Think about what worked and what did not. GET SUPPORT AND ENCOURAGEMENT You have a better chance of being successful if you have help. You can get support in many ways.  Tell your family, friends, and coworkers that you are going to quit and need their support. Ask   them not to smoke around you.  Get individual, group, or telephone counseling and support. Programs are available at local hospitals and health centers. Call your local health department for information about programs in your area.  Spiritual beliefs and practices may help some smokers quit.  Download a "quit meter" on your computer to keep track of quit statistics, such as how long you have gone without smoking, cigarettes not smoked, and money saved.  Get a self-help book about quitting smoking and staying off tobacco. LEARN NEW SKILLS AND BEHAVIORS  Distract yourself from urges to smoke. Talk to  someone, go for a walk, or occupy your time with a task.  Change your normal routine. Take a different route to work. Drink tea instead of coffee. Eat breakfast in a different place.  Reduce your stress. Take a hot bath, exercise, or read a book.  Plan something enjoyable to do every day. Reward yourself for not smoking.  Explore interactive web-based programs that specialize in helping you quit. GET MEDICINE AND USE IT CORRECTLY Medicines can help you stop smoking and decrease the urge to smoke. Combining medicine with the above behavioral methods and support can greatly increase your chances of successfully quitting smoking.  Nicotine replacement therapy helps deliver nicotine to your body without the negative effects and risks of smoking. Nicotine replacement therapy includes nicotine gum, lozenges, inhalers, nasal sprays, and skin patches. Some may be available over-the-counter and others require a prescription.  Antidepressant medicine helps people abstain from smoking, but how this works is unknown. This medicine is available by prescription.  Nicotinic receptor partial agonist medicine simulates the effect of nicotine in your brain. This medicine is available by prescription. Ask your health care provider for advice about which medicines to use and how to use them based on your health history. Your health care provider will tell you what side effects to look out for if you choose to be on a medicine or therapy. Carefully read the information on the package. Do not use any other product containing nicotine while using a nicotine replacement product.  RELAPSE OR DIFFICULT SITUATIONS Most relapses occur within the first 3 months after quitting. Do not be discouraged if you start smoking again. Remember, most people try several times before finally quitting. You may have symptoms of withdrawal because your body is used to nicotine. You may crave cigarettes, be irritable, feel very hungry, cough  often, get headaches, or have difficulty concentrating. The withdrawal symptoms are only temporary. They are strongest when you first quit, but they will go away within 10-14 days. To reduce the chances of relapse, try to:  Avoid drinking alcohol. Drinking lowers your chances of successfully quitting.  Reduce the amount of caffeine you consume. Once you quit smoking, the amount of caffeine in your body increases and can give you symptoms, such as a rapid heartbeat, sweating, and anxiety.  Avoid smokers because they can make you want to smoke.  Do not let weight gain distract you. Many smokers will gain weight when they quit, usually less than 10 pounds. Eat a healthy diet and stay active. You can always lose the weight gained after you quit.  Find ways to improve your mood other than smoking. FOR MORE INFORMATION  www.smokefree.gov  Document Released: 03/06/2001 Document Revised: 07/27/2013 Document Reviewed: 06/21/2011 ExitCare Patient Information 2015 [employer]. This information is not intended to replace advice given to you by your health care provider. Make sure you discuss any questions you have with your   health care provider.  

## 2015-08-29 ENCOUNTER — Encounter (HOSPITAL_COMMUNITY): Payer: Self-pay

## 2015-08-29 ENCOUNTER — Ambulatory Visit: Payer: Medicare Other | Admitting: Cardiovascular Disease

## 2015-08-29 ENCOUNTER — Encounter (HOSPITAL_COMMUNITY)
Admission: RE | Admit: 2015-08-29 | Discharge: 2015-08-29 | Disposition: A | Discharge status: Home / Self Care | Payer: Medicare Other | Source: Ambulatory Visit | Attending: Orthopedic Surgery | Admitting: Orthopedic Surgery

## 2015-08-29 HISTORY — DX: Pneumonia, unspecified organism: J18.9

## 2015-08-29 HISTORY — DX: Unspecified asthma, uncomplicated: J45.909

## 2015-08-29 HISTORY — DX: Unspecified visual loss: H54.7

## 2015-08-29 HISTORY — DX: Central retinal artery occlusion, left eye: H34.12

## 2015-08-29 LAB — COMPREHENSIVE METABOLIC PANEL
ALBUMIN: 4.2 g/dL (ref 3.5–5.0)
ALT: 21 U/L (ref 17–63)
ANION GAP: 9 (ref 5–15)
AST: 23 U/L (ref 15–41)
Alkaline Phosphatase: 70 U/L (ref 38–126)
BILIRUBIN TOTAL: 1 mg/dL (ref 0.3–1.2)
BUN: 16 mg/dL (ref 6–20)
CHLORIDE: 102 mmol/L (ref 101–111)
CO2: 26 mmol/L (ref 22–32)
Calcium: 10.1 mg/dL (ref 8.9–10.3)
Creatinine, Ser: 1.3 mg/dL — ABNORMAL HIGH (ref 0.61–1.24)
GFR calc Af Amer: >60 mL/min (ref >60)
GFR calc non Af Amer: 59 mL/min — ABNORMAL LOW (ref >60)
GLUCOSE: 117 mg/dL — AB (ref 65–99)
POTASSIUM: 5.2 mmol/L — AB (ref 3.5–5.1)
SODIUM: 137 mmol/L (ref 135–145)
Total Protein: 7.3 g/dL (ref 6.5–8.1)

## 2015-08-29 LAB — CBC
HEMATOCRIT: 43.2 % (ref 39.0–52.0)
HEMOGLOBIN: 13.9 g/dL (ref 13.0–17.0)
MCH: 29.2 pg (ref 26.0–34.0)
MCHC: 32.2 g/dL (ref 30.0–36.0)
MCV: 90.8 fL (ref 78.0–100.0)
PLATELETS: 218 10*3/uL (ref 150–400)
RBC: 4.76 MIL/uL (ref 4.22–5.81)
RDW: 14.4 % (ref 11.5–15.5)
WBC: 6.5 10*3/uL (ref 4.0–10.5)

## 2015-08-29 LAB — GLUCOSE, CAPILLARY: Glucose-Capillary: 113 mg/dL — ABNORMAL HIGH (ref 65–99)

## 2015-08-29 LAB — SURGICAL PCR SCREEN
MRSA, PCR: NEGATIVE
Staphylococcus aureus: NEGATIVE

## 2015-08-29 NOTE — Progress Notes (Signed)
[name] denies chest pain or shortness of breath at rest.  Cardiologist is Dr Fletcher Anon, PCP is at [facility].  H/P from PCP list that patient has diabetes, patient says he doesnt, patient is not on any medication for diabetes.

## 2015-08-29 NOTE — Pre-Procedure Instructions (Signed)
    Travis [name]  08/29/2015   Your procedure is scheduled on Thursday , June 8.  Report to [facility] Admitting at 10:30 A.M.                 Your surgery or procedure is scheduled for 12:30 PM   Call this number if you have problems the morning of surgery: [phone]                   For any other questions, please call [phone], Monday - Friday 8 AM - 4 PM.   Remember:  Do not eat food or drink liquids after midnight Wednesday, June 7.  Take these medicines the morning of surgery with A SIP OF WATER :allopurinol (ZYLOPRIM),  carvedilol (COREG).                   May use eye drops and Albuterol if needed.              STOP taking Vitamins, and Fish Oil.               Stop Aspirin as directed by Dr Onnie Graham.    Do not wear jewelry, make-up or nail polish.  Do not wear lotions, powders, or perfumes.   Men may shave face and neck.  Do not bring valuables to the hospital.  [facility] is not responsible for any belongings or valuables.  Contacts, dentures or bridgework may not be worn into surgery.  Leave your suitcase in the car.  After surgery it may be brought to your room.  For patients admitted to the hospital, discharge time will be determined by your treatment team.  Patients discharged the day of surgery will not be allowed to drive home.   Name and phone number of your driver-  Special instructions: Review  [place] - Preparing For Surgery.  Please read over the following fact sheets that you were given: Review   - Preparing For Surgery.

## 2015-08-30 LAB — HEMOGLOBIN A1C
Hgb A1c MFr Bld: 6.7 % — ABNORMAL HIGH (ref 4.8–5.6)
MEAN PLASMA GLUCOSE: 146 mg/dL

## 2015-08-30 NOTE — Progress Notes (Addendum)
Anesthesia Chart Review:  Pt is a 60 year old male scheduled for R total shoulder arthroplasty on 09/01/2015 with Dr. Onnie Graham.   Cardiologist is Dr. Kathlyn [place], last office visit with Darylene Price, NP who is aware of upcoming surgery.   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  Current smoker. BMI 23. S/p R hip arthroplasty 03/15/13.   Medications include: albuterol, ASA, carvedilol, fenofibrate, sacubitril-valsartan, spironolactone  Preoperative labs reviewed.  HgbA1c 6.7, glucose 117  Chest x-ray 05/12/15 reviewed. There is no alveolar pneumonia nor evidence of CHF. There are mild chronic bronchitic changes.  EKG 03/18/15: NSR. LAFB. LVH with QRS widening and repolarization abnormality  Cardiac cath 01/28/15:   Prox RCA to Mid RCA lesion, 10% stenosed.  Prox LAD to Mid LAD lesion, 30% stenosed. 1. Mild one-vessel coronary artery disease which does not seem to be different from most recent coronary angiography. 2. Moderate to severely reduced LV systolic function by noninvasive angiography. 3. Mildly elevated left ventricular end-diastolic pressure. - Recommendations: The patient has nonischemic cardiomyopathy likely alcohol induced with poor adherence to medical therapy. Continue medical therapy.  Echo 01/26/15:  - Left ventricle: The cavity size was mildly dilated. Systolic function was severely reduced. The estimated ejection fraction was in the range of 25% to 30%. Diffuse hypokinesis. Regional wall motion abnormalities cannot be excluded. Doppler parameters are consistent with abnormal left ventricular relaxation (grade 1 diastolic dysfunction). - Left atrium: The atrium was normal in size. - Right ventricle: Systolic function was normal. - Pulmonary arteries: Systolic pressure could not be estimated  Carotid duplex 09/14/13:  - Mild  difficulty bilaterally due to tortuosity. - Bilateral - 1% to 39% ICA stenosis. Elevation of velocities in the distal left ICA probably due only to tortuosity. Plaqueformation does not support hte increase. Vertebral artery flow is antegrade.  Dr. Tyrell Antonio note from 03/18/15 states pt will need a repeat echo in a few months to recheck EF but this has not happened. Reviewed case with Dr. Tobias Alexander. Pt will need cardiac clearance for surgery from Dr. Fletcher Anon. Notified Velvet in Dr. Susie Cassette office.   Willeen Cass, FNP-BC [facility]/Anesthesiology Phone: [phone] 08/30/2015 3:36 PM  Addendum: This morning a clearance note from Dr. Fletcher Anon was signed indicating patient was "moderate risk" for surgery.  [facility] Stay [facility]/Anesthesiology Phone [phone] 09/01/2015 9:22 AM

## 2015-08-31 ENCOUNTER — Telehealth: Payer: Self-pay | Admitting: Cardiovascular Disease

## 2015-08-31 NOTE — Telephone Encounter (Signed)
Received clearance from [facility] for 6/8 right shoulder surgery. As surgery is tomorrow, I have placed in MD basket and will make him aware.

## 2015-09-01 ENCOUNTER — Encounter (HOSPITAL_COMMUNITY): Payer: Self-pay | Admitting: *Deleted

## 2015-09-01 ENCOUNTER — Inpatient Hospital Stay (HOSPITAL_COMMUNITY): Payer: Medicare Other | Admitting: Certified Registered Nurse Anesthetist

## 2015-09-01 ENCOUNTER — Inpatient Hospital Stay (HOSPITAL_COMMUNITY)
Admission: RE | Admit: 2015-09-01 | Discharge: 2015-09-02 | DRG: 483 | Disposition: A | Discharge status: Home / Self Care | Payer: Medicare Other | Source: Ambulatory Visit | Attending: Orthopedic Surgery | Admitting: Orthopedic Surgery

## 2015-09-01 ENCOUNTER — Encounter (HOSPITAL_COMMUNITY)
Admission: RE | Disposition: A | Discharge status: Home / Self Care | Payer: Self-pay | Source: Ambulatory Visit | Attending: Orthopedic Surgery

## 2015-09-01 ENCOUNTER — Inpatient Hospital Stay (HOSPITAL_COMMUNITY): Payer: Medicare Other | Admitting: Emergency Medicine

## 2015-09-01 DIAGNOSIS — M25711 Osteophyte, right shoulder: Secondary | ICD-10-CM | POA: Diagnosis present

## 2015-09-01 DIAGNOSIS — E119 Type 2 diabetes mellitus without complications: Secondary | ICD-10-CM | POA: Diagnosis present

## 2015-09-01 DIAGNOSIS — I5022 Chronic systolic (congestive) heart failure: Secondary | ICD-10-CM | POA: Diagnosis present

## 2015-09-01 DIAGNOSIS — Z96611 Presence of right artificial shoulder joint: Secondary | ICD-10-CM

## 2015-09-01 DIAGNOSIS — F1010 Alcohol abuse, uncomplicated: Secondary | ICD-10-CM | POA: Diagnosis present

## 2015-09-01 DIAGNOSIS — M19011 Primary osteoarthritis, right shoulder: Secondary | ICD-10-CM | POA: Diagnosis not present

## 2015-09-01 DIAGNOSIS — I2510 Atherosclerotic heart disease of native coronary artery without angina pectoris: Secondary | ICD-10-CM | POA: Diagnosis present

## 2015-09-01 DIAGNOSIS — H547 Unspecified visual loss: Secondary | ICD-10-CM | POA: Diagnosis present

## 2015-09-01 DIAGNOSIS — Z7982 Long term (current) use of aspirin: Secondary | ICD-10-CM

## 2015-09-01 DIAGNOSIS — J45909 Unspecified asthma, uncomplicated: Secondary | ICD-10-CM | POA: Diagnosis present

## 2015-09-01 DIAGNOSIS — I426 Alcoholic cardiomyopathy: Secondary | ICD-10-CM | POA: Diagnosis present

## 2015-09-01 DIAGNOSIS — I110 Hypertensive heart disease with heart failure: Secondary | ICD-10-CM | POA: Diagnosis present

## 2015-09-01 DIAGNOSIS — G8918 Other acute postprocedural pain: Secondary | ICD-10-CM | POA: Diagnosis not present

## 2015-09-01 DIAGNOSIS — F17210 Nicotine dependence, cigarettes, uncomplicated: Secondary | ICD-10-CM | POA: Diagnosis present

## 2015-09-01 DIAGNOSIS — Z96619 Presence of unspecified artificial shoulder joint: Secondary | ICD-10-CM

## 2015-09-01 DIAGNOSIS — M109 Gout, unspecified: Secondary | ICD-10-CM | POA: Diagnosis present

## 2015-09-01 HISTORY — PX: TOTAL SHOULDER ARTHROPLASTY: SHX126

## 2015-09-01 LAB — GLUCOSE, CAPILLARY
GLUCOSE-CAPILLARY: 105 mg/dL — AB (ref 65–99)
GLUCOSE-CAPILLARY: 127 mg/dL — AB (ref 65–99)

## 2015-09-01 SURGERY — ARTHROPLASTY, SHOULDER, TOTAL
Anesthesia: Regional | Site: Shoulder | Laterality: Right

## 2015-09-01 MED ORDER — DIAZEPAM 5 MG PO TABS
5.0000 mg | ORAL_TABLET | Freq: Four times a day (QID) | ORAL | Status: DC | PRN
  Administered 2015-09-02: 5 mg via ORAL
  Filled 2015-09-01: qty 1

## 2015-09-01 MED ORDER — FENTANYL CITRATE (PF) 250 MCG/5ML IJ SOLN
INTRAMUSCULAR | Status: AC
  Filled 2015-09-01: qty 5

## 2015-09-01 MED ORDER — ALLOPURINOL 100 MG PO TABS
100.0000 mg | ORAL_TABLET | Freq: Every day | ORAL | Status: DC
  Administered 2015-09-01 – 2015-09-02 (×2): 100 mg via ORAL
  Filled 2015-09-01 (×2): qty 1

## 2015-09-01 MED ORDER — PROPOFOL 10 MG/ML IV BOLUS
INTRAVENOUS | Status: DC | PRN
  Administered 2015-09-01: 200 mg via INTRAVENOUS

## 2015-09-01 MED ORDER — SUGAMMADEX SODIUM 200 MG/2ML IV SOLN
INTRAVENOUS | Status: AC
  Filled 2015-09-01: qty 2

## 2015-09-01 MED ORDER — CEFAZOLIN SODIUM-DEXTROSE 2-4 GM/100ML-% IV SOLN
2.0000 g | Freq: Four times a day (QID) | INTRAVENOUS | Status: AC
  Administered 2015-09-01 – 2015-09-02 (×3): 2 g via INTRAVENOUS
  Filled 2015-09-01 (×3): qty 100

## 2015-09-01 MED ORDER — MIDAZOLAM HCL 5 MG/5ML IJ SOLN
INTRAMUSCULAR | Status: DC | PRN
  Administered 2015-09-01: 2 mg via INTRAVENOUS

## 2015-09-01 MED ORDER — FENTANYL CITRATE (PF) 250 MCG/5ML IJ SOLN
INTRAMUSCULAR | Status: DC | PRN
  Administered 2015-09-01: 100 ug via INTRAVENOUS

## 2015-09-01 MED ORDER — ALBUTEROL SULFATE (2.5 MG/3ML) 0.083% IN NEBU: 2.5000 mg | INHALATION_SOLUTION | Freq: Four times a day (QID) | RESPIRATORY_TRACT | Status: DC | PRN

## 2015-09-01 MED ORDER — PROMETHAZINE HCL 25 MG/ML IJ SOLN: 6.2500 mg | INTRAMUSCULAR | Status: DC | PRN

## 2015-09-01 MED ORDER — HYDROMORPHONE HCL 1 MG/ML IJ SOLN: 0.2500 mg | INTRAMUSCULAR | Status: DC | PRN

## 2015-09-01 MED ORDER — ASPIRIN EC 81 MG PO TBEC: 162.5000 mg | DELAYED_RELEASE_TABLET | Freq: Every day | ORAL | Status: DC

## 2015-09-01 MED ORDER — HYDROMORPHONE HCL 1 MG/ML IJ SOLN
1.0000 mg | INTRAMUSCULAR | Status: DC | PRN
  Administered 2015-09-02 (×2): 1 mg via INTRAVENOUS
  Filled 2015-09-01 (×2): qty 1

## 2015-09-01 MED ORDER — KETOROLAC TROMETHAMINE 15 MG/ML IJ SOLN
7.5000 mg | Freq: Four times a day (QID) | INTRAMUSCULAR | Status: DC
  Administered 2015-09-01 – 2015-09-02 (×3): 7.5 mg via INTRAVENOUS
  Filled 2015-09-01 (×3): qty 1

## 2015-09-01 MED ORDER — ALUM & MAG HYDROXIDE-SIMETH 200-200-20 MG/5ML PO SUSP: 30.0000 mL | ORAL | Status: DC | PRN

## 2015-09-01 MED ORDER — DOCUSATE SODIUM 100 MG PO CAPS
100.0000 mg | ORAL_CAPSULE | Freq: Two times a day (BID) | ORAL | Status: DC
  Administered 2015-09-01 – 2015-09-02 (×2): 100 mg via ORAL
  Filled 2015-09-01 (×2): qty 1

## 2015-09-01 MED ORDER — 0.9 % SODIUM CHLORIDE (POUR BTL) OPTIME
TOPICAL | Status: DC | PRN
  Administered 2015-09-01: 1000 mL

## 2015-09-01 MED ORDER — TRANEXAMIC ACID 1000 MG/10ML IV SOLN
1000.0000 mg | INTRAVENOUS | Status: AC
  Administered 2015-09-01: 1000 mg via INTRAVENOUS
  Filled 2015-09-01: qty 10

## 2015-09-01 MED ORDER — PHENOL 1.4 % MT LIQD
1.0000 | OROMUCOSAL | Status: DC | PRN
Start: 2015-09-01 — End: 2015-09-02

## 2015-09-01 MED ORDER — ROCURONIUM BROMIDE 50 MG/5ML IV SOLN
INTRAVENOUS | Status: AC
  Filled 2015-09-01: qty 1

## 2015-09-01 MED ORDER — ACETAMINOPHEN 325 MG PO TABS: 650.0000 mg | ORAL_TABLET | Freq: Four times a day (QID) | ORAL | Status: DC | PRN

## 2015-09-01 MED ORDER — SPIRONOLACTONE 25 MG PO TABS: 12.5000 mg | ORAL_TABLET | Freq: Every day | ORAL | Status: DC

## 2015-09-01 MED ORDER — ACETAMINOPHEN 650 MG RE SUPP: 650.0000 mg | Freq: Four times a day (QID) | RECTAL | Status: DC | PRN

## 2015-09-01 MED ORDER — PHENYLEPHRINE HCL 10 MG/ML IJ SOLN
10.0000 mg | INTRAVENOUS | Status: DC | PRN
  Administered 2015-09-01: 50 ug/min via INTRAVENOUS

## 2015-09-01 MED ORDER — CARVEDILOL 12.5 MG PO TABS
12.5000 mg | ORAL_TABLET | Freq: Two times a day (BID) | ORAL | Status: DC
  Administered 2015-09-01 – 2015-09-02 (×2): 12.5 mg via ORAL
  Filled 2015-09-01 (×2): qty 1

## 2015-09-01 MED ORDER — POLYETHYLENE GLYCOL 3350 17 G PO PACK: 17.0000 g | PACK | Freq: Every day | ORAL | Status: DC | PRN

## 2015-09-01 MED ORDER — LACTATED RINGERS IV SOLN
INTRAVENOUS | Status: DC
  Administered 2015-09-01: 18:00:00 via INTRAVENOUS

## 2015-09-01 MED ORDER — ONDANSETRON HCL 4 MG PO TABS: 4.0000 mg | ORAL_TABLET | Freq: Four times a day (QID) | ORAL | Status: DC | PRN

## 2015-09-01 MED ORDER — ROCURONIUM BROMIDE 100 MG/10ML IV SOLN
INTRAVENOUS | Status: DC | PRN
  Administered 2015-09-01: 40 mg via INTRAVENOUS

## 2015-09-01 MED ORDER — FENTANYL CITRATE (PF) 100 MCG/2ML IJ SOLN
INTRAMUSCULAR | Status: AC
  Administered 2015-09-01: 50 ug
  Filled 2015-09-01: qty 2

## 2015-09-01 MED ORDER — BUPIVACAINE-EPINEPHRINE (PF) 0.5% -1:200000 IJ SOLN
INTRAMUSCULAR | Status: DC | PRN
  Administered 2015-09-01: 25 mL via PERINEURAL

## 2015-09-01 MED ORDER — LIDOCAINE 2% (20 MG/ML) 5 ML SYRINGE
INTRAMUSCULAR | Status: AC
  Filled 2015-09-01: qty 5

## 2015-09-01 MED ORDER — LIDOCAINE 2% (20 MG/ML) 5 ML SYRINGE
INTRAMUSCULAR | Status: DC | PRN
  Administered 2015-09-01: 40 mg via INTRAVENOUS

## 2015-09-01 MED ORDER — CHLORHEXIDINE GLUCONATE 4 % EX LIQD: 60.0000 mL | Freq: Once | CUTANEOUS | Status: DC

## 2015-09-01 MED ORDER — MENTHOL 3 MG MT LOZG: 1.0000 | LOZENGE | OROMUCOSAL | Status: DC | PRN

## 2015-09-01 MED ORDER — ONDANSETRON HCL 4 MG/2ML IJ SOLN: 4.0000 mg | Freq: Four times a day (QID) | INTRAMUSCULAR | Status: DC | PRN

## 2015-09-01 MED ORDER — SACUBITRIL-VALSARTAN 24-26 MG PO TABS
1.0000 | ORAL_TABLET | Freq: Two times a day (BID) | ORAL | Status: DC
  Administered 2015-09-01 – 2015-09-02 (×2): 1 via ORAL
  Filled 2015-09-01 (×2): qty 1

## 2015-09-01 MED ORDER — OXYCODONE HCL 5 MG PO TABS
5.0000 mg | ORAL_TABLET | ORAL | Status: DC | PRN
  Administered 2015-09-01 – 2015-09-02 (×4): 10 mg via ORAL
  Filled 2015-09-01 (×4): qty 2

## 2015-09-01 MED ORDER — PROPOFOL 10 MG/ML IV BOLUS
INTRAVENOUS | Status: AC
  Filled 2015-09-01: qty 20

## 2015-09-01 MED ORDER — SPIRONOLACTONE 25 MG PO TABS
12.5000 mg | ORAL_TABLET | Freq: Every day | ORAL | Status: DC
  Administered 2015-09-02: 12.5 mg via ORAL
  Filled 2015-09-01: qty 1

## 2015-09-01 MED ORDER — METOCLOPRAMIDE HCL 5 MG PO TABS: 5.0000 mg | ORAL_TABLET | Freq: Three times a day (TID) | ORAL | Status: DC | PRN

## 2015-09-01 MED ORDER — CEFAZOLIN SODIUM-DEXTROSE 2-4 GM/100ML-% IV SOLN
2.0000 g | INTRAVENOUS | Status: DC
Start: 2015-09-01 — End: 2015-09-01

## 2015-09-01 MED ORDER — LACTATED RINGERS IV SOLN
INTRAVENOUS | Status: DC
  Administered 2015-09-01 (×2): via INTRAVENOUS

## 2015-09-01 MED ORDER — DIPHENHYDRAMINE HCL 12.5 MG/5ML PO ELIX: 12.5000 mg | ORAL_SOLUTION | ORAL | Status: DC | PRN

## 2015-09-01 MED ORDER — FLEET ENEMA 7-19 GM/118ML RE ENEM: 1.0000 | ENEMA | Freq: Once | RECTAL | Status: DC | PRN

## 2015-09-01 MED ORDER — ONDANSETRON HCL 4 MG/2ML IJ SOLN
INTRAMUSCULAR | Status: AC
  Filled 2015-09-01: qty 6

## 2015-09-01 MED ORDER — METOCLOPRAMIDE HCL 5 MG/ML IJ SOLN: 5.0000 mg | Freq: Three times a day (TID) | INTRAMUSCULAR | Status: DC | PRN

## 2015-09-01 MED ORDER — ONDANSETRON HCL 4 MG/2ML IJ SOLN
INTRAMUSCULAR | Status: DC | PRN
  Administered 2015-09-01: 4 mg via INTRAVENOUS

## 2015-09-01 MED ORDER — CEFAZOLIN SODIUM-DEXTROSE 2-4 GM/100ML-% IV SOLN
INTRAVENOUS | Status: AC
  Administered 2015-09-01: 2 g via INTRAVENOUS
  Filled 2015-09-01: qty 100

## 2015-09-01 MED ORDER — BISACODYL 5 MG PO TBEC: 5.0000 mg | DELAYED_RELEASE_TABLET | Freq: Every day | ORAL | Status: DC | PRN

## 2015-09-01 MED ORDER — MIDAZOLAM HCL 2 MG/2ML IJ SOLN
INTRAMUSCULAR | Status: AC
  Administered 2015-09-01: 1 mg
  Filled 2015-09-01: qty 2

## 2015-09-01 SURGICAL SUPPLY — 65 items
ADH SKN CLS LQ APL DERMABOND (GAUZE/BANDAGES/DRESSINGS) ×1
AID PSTN UNV HD RSTRNT DISP (MISCELLANEOUS) ×1
BLADE SAW SGTL 83.5X18.5 (BLADE) ×2 IMPLANT
CEMENT BONE DEPUY (Cement) ×2 IMPLANT
COVER SURGICAL LIGHT HANDLE (MISCELLANEOUS) ×2 IMPLANT
DERMABOND ADHESIVE PROPEN (GAUZE/BANDAGES/DRESSINGS) ×1
DERMABOND ADVANCED .7 DNX6 (GAUZE/BANDAGES/DRESSINGS) ×1 IMPLANT
DRAPE ORTHO SPLIT 77X108 STRL (DRAPES) ×4
DRAPE SURG 17X11 SM STRL (DRAPES) ×2 IMPLANT
DRAPE SURG ORHT 6 SPLT 77X108 (DRAPES) ×2 IMPLANT
DRAPE U-SHAPE 47X51 STRL (DRAPES) ×2 IMPLANT
DRILL BIT 7/64X5 (BIT) IMPLANT
DRSG AQUACEL AG ADV 3.5X10 (GAUZE/BANDAGES/DRESSINGS) ×2 IMPLANT
DURAPREP 26ML APPLICATOR (WOUND CARE) ×2 IMPLANT
ELECT BLADE 4.0 EZ CLEAN MEGAD (MISCELLANEOUS) ×2
ELECT CAUTERY BLADE 6.4 (BLADE) ×2 IMPLANT
ELECT REM PT RETURN 9FT ADLT (ELECTROSURGICAL) ×2
ELECTRODE BLDE 4.0 EZ CLN MEGD (MISCELLANEOUS) ×1 IMPLANT
ELECTRODE REM PT RTRN 9FT ADLT (ELECTROSURGICAL) ×1 IMPLANT
FACESHIELD WRAPAROUND (MASK) ×6 IMPLANT
FACESHIELD WRAPAROUND OR TEAM (MASK) ×3 IMPLANT
GLENOID UNI VAULTLOCK LRG (Shoulder) ×1 IMPLANT
GLOVE BIO SURGEON STRL SZ7.5 (GLOVE) ×2 IMPLANT
GLOVE BIO SURGEON STRL SZ8 (GLOVE) ×2 IMPLANT
GLOVE EUDERMIC 7 POWDERFREE (GLOVE) ×2 IMPLANT
GLOVE SS BIOGEL STRL SZ 7.5 (GLOVE) ×1 IMPLANT
GLOVE SUPERSENSE BIOGEL SZ 7.5 (GLOVE) ×1
GOWN STRL REUS W/ TWL LRG LVL3 (GOWN DISPOSABLE) ×1 IMPLANT
GOWN STRL REUS W/ TWL XL LVL3 (GOWN DISPOSABLE) ×2 IMPLANT
GOWN STRL REUS W/TWL LRG LVL3 (GOWN DISPOSABLE) ×2
GOWN STRL REUS W/TWL XL LVL3 (GOWN DISPOSABLE) ×4
KIT BASIN OR (CUSTOM PROCEDURE TRAY) ×2 IMPLANT
KIT ROOM TURNOVER OR (KITS) ×2 IMPLANT
MANIFOLD NEPTUNE II (INSTRUMENTS) ×2 IMPLANT
NDL HYPO 25GX1X1/2 BEV (NEEDLE) IMPLANT
NDL SUT .5 MAYO 1.404X.05X (NEEDLE) ×1 IMPLANT
NEEDLE HYPO 25GX1X1/2 BEV (NEEDLE) IMPLANT
NEEDLE MAYO TAPER (NEEDLE) ×2
NS IRRIG 1000ML POUR BTL (IV SOLUTION) ×2 IMPLANT
PACK SHOULDER (CUSTOM PROCEDURE TRAY) ×2 IMPLANT
PAD ARMBOARD 7.5X6 YLW CONV (MISCELLANEOUS) ×4 IMPLANT
PASSER SUT SWANSON 36MM LOOP (INSTRUMENTS) IMPLANT
RESTRAINT HEAD UNIVERSAL NS (MISCELLANEOUS) ×2 IMPLANT
SLING ARM FOAM STRAP LRG (SOFTGOODS) IMPLANT
SLING ARM XL FOAM STRAP (SOFTGOODS) ×2 IMPLANT
SMARTMIX MINI TOWER (MISCELLANEOUS) ×2
SPONGE LAP 18X18 X RAY DECT (DISPOSABLE) IMPLANT
SPONGE LAP 4X18 X RAY DECT (DISPOSABLE) ×2 IMPLANT
STEM HUMERAL UNIV APEX 13MM (Stem) ×1 IMPLANT
STEM HUMERAL UNIV II 56X24 (Stem) ×1 IMPLANT
SUCTION FRAZIER HANDLE 10FR (MISCELLANEOUS) ×1
SUCTION TUBE FRAZIER 10FR DISP (MISCELLANEOUS) ×1 IMPLANT
SUT BONE WAX W31G (SUTURE) IMPLANT
SUT FIBERWIRE #2 38 T-5 BLUE (SUTURE) ×4
SUT MNCRL AB 3-0 PS2 18 (SUTURE) ×2 IMPLANT
SUT VIC AB 1 CT1 27 (SUTURE) ×4
SUT VIC AB 1 CT1 27XBRD ANBCTR (SUTURE) ×2 IMPLANT
SUT VIC AB 2-0 CT1 27 (SUTURE) ×2
SUT VIC AB 2-0 CT1 TAPERPNT 27 (SUTURE) ×1 IMPLANT
SUTURE FIBERWR #2 38 T-5 BLUE (SUTURE) ×2 IMPLANT
SYR CONTROL 10ML LL (SYRINGE) IMPLANT
TOWEL OR 17X24 6PK STRL BLUE (TOWEL DISPOSABLE) ×2 IMPLANT
TOWEL OR 17X26 10 PK STRL BLUE (TOWEL DISPOSABLE) ×2 IMPLANT
TOWER SMARTMIX MINI (MISCELLANEOUS) ×1 IMPLANT
WATER STERILE IRR 1000ML POUR (IV SOLUTION) ×1 IMPLANT

## 2015-09-01 NOTE — Telephone Encounter (Signed)
Faxed clearance to [facility] June 7, 5:25pm to [id number] Left message June 8, 7:40am for Velvet to call if clearance not received.

## 2015-09-01 NOTE — Anesthesia Preprocedure Evaluation (Signed)
Anesthesia Evaluation  Patient identified by MRN, date of birth, ID band Patient awake    Reviewed: Allergy & Precautions, NPO status , Patient's Chart, lab work & pertinent test results, reviewed documented beta blocker date and time   Airway Mallampati: II  TM Distance: >3 FB Neck ROM: Full    Dental  (+) Dental Advisory Given   Pulmonary asthma , Current Smoker,    breath sounds clear to auscultation       Cardiovascular hypertension, Pt. on medications and Pt. on home beta blockers + CAD, + Peripheral Vascular Disease and +CHF   Rhythm:Regular Rate:Normal  EF 25-30%   Neuro/Psych negative neurological ROS     GI/Hepatic negative GI ROS, Neg liver ROS,   Endo/Other  diabetes, Type 2  Renal/GU Renal disease     Musculoskeletal  (+) Arthritis ,   Abdominal   Peds  Hematology  (+) anemia ,   Anesthesia Other Findings   Reproductive/Obstetrics                             Lab Results  Component Value Date   WBC 6.5 08/29/2015   HGB 13.9 08/29/2015   HCT 43.2 08/29/2015   MCV 90.8 08/29/2015   PLT 218 08/29/2015   Lab Results  Component Value Date   CREATININE 1.30* 08/29/2015   BUN 16 08/29/2015   NA 137 08/29/2015   K 5.2* 08/29/2015   CL 102 08/29/2015   CO2 26 08/29/2015    Anesthesia Physical Anesthesia Plan  ASA: IV  Anesthesia Plan: General and Regional   Post-op Pain Management: GA combined w/ Regional for post-op pain   Induction: Intravenous  Airway Management Planned: Oral ETT  Additional Equipment:   Intra-op Plan:   Post-operative Plan: Extubation in OR  Informed Consent: I have reviewed the patients History and Physical, chart, labs and discussed the procedure including the risks, benefits and alternatives for the proposed anesthesia with the patient or authorized representative who has indicated his/her understanding and acceptance.   Dental  advisory given  Plan Discussed with: CRNA  Anesthesia Plan Comments:         Anesthesia Quick Evaluation

## 2015-09-01 NOTE — Op Note (Signed)
09/01/2015  4:04 PM  PATIENT:   Travis [name]  60 y.o. male  PRE-OPERATIVE DIAGNOSIS:  RIGHT SHOULDER OA   POST-OPERATIVE DIAGNOSIS:  same  PROCEDURE:  R TSA, #13 stem, 56x24 head, large glenoid  SURGEON:  Citlally Captain, Metta Clines M.D.  ASSISTANTS: Sh[place]ord pac   ANESTHESIA:   GET + ISB  EBL: 200  SPECIMEN:  none  Drains: none   PATIENT DISPOSITION:  PACU - hemodynamically stable.    PLAN OF CARE: Admit for overnight observation  Dictation# [id number]   Contact # [phone]

## 2015-09-01 NOTE — Transfer of Care (Signed)
Immediate Anesthesia Transfer of Care Note  Patient: Travis [name]  Procedure(s) Performed: Procedure(s): RIGHT TOTAL SHOULDER ARTHROPLASTY (Right)  Patient Location: PACU  Anesthesia Type:GA combined with regional for post-op pain  Level of Consciousness: awake, oriented and patient cooperative  Airway & Oxygen Therapy: Patient Spontanous Breathing and Patient connected to nasal cannula oxygen  Post-op Assessment: Report given to RN, Post -op Vital signs reviewed and stable and Patient moving all extremities  Post vital signs: Reviewed and stable  Last Vitals:  Filed Vitals:   09/01/15 1135 09/01/15 1613  BP: 131/81 112/75  Pulse: 77 71  Temp:    Resp: 19 22    Last Pain:  Filed Vitals:   09/01/15 1613  PainSc: 6          Complications: No apparent anesthesia complications

## 2015-09-01 NOTE — H&P (Signed)
[name]    Chief Complaint: RIGHT SHOULDER OA  HPI: The patient is a 60 y.o. male with end stage right shoulder OA  Past Medical History  Diagnosis Date  . Chronic systolic CHF (congestive heart failure) ([place])     a. EF 20-25% by echo 12/28/10; b improved to 45-50% on echo in June 2015  . Nonischemic cardiomyopathy ([place])     minimal coronary disease by cath 12/29/10  . Hemoptysis     secondary to pulmonary edema  . Hyperlipidemia   . Chronic anemia   . GI bleed     15 years ago  . Tobacco abuse   . Alcohol abuse   . Osteoarthritis   . Gout   . Diabetes mellitus, type 2 ([place])     pt reports his DM is gone  . 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 Coronary artery disease     Cardiac catheterization in November 2016 showed mild 30% proximal LAD stenosis with no evidence of obstructive disease.  . 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 Pneumonia   . Asthma   . Vision loss     peripherial vision only left eye.[facility]  artery occusion   . 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 Cardiac catheterization    . Hip arthroplasty Right 03/15/2013    Procedure: ARTHROPLASTY BIPOLAR HIP;  Surgeon: [name], MD;  Location: [place];  Service: Orthopedics;  Laterality: Right;  . Cardiac catheterization N/A 01/28/2015    Procedure: Left Heart Cath and Coronary Angiography;  Surgeon: Wellington Hampshire, MD;  Location: [place] [facility];  Service: Cardiovascular;  Laterality: N/A;  . Colonoscopy      Family History  Problem Relation Age of Onset  . Diabetes Mother   . Hypertension Mother   . Diabetes Father   . Hypertension Father   . Cancer Neg Hx   . Heart disease Neg Hx   . Stroke Neg Hx     Social History:  reports that he has been smoking Cigarettes.  He has a 30 pack-year smoking history. He has never used smokeless tobacco. He reports  that he drinks about 0.6 oz of alcohol per week. He reports that he uses illicit drugs (Marijuana).   Medications Prior to Admission  Medication Sig Dispense Refill  . albuterol (PROVENTIL HFA;VENTOLIN HFA) 108 (90 BASE) MCG/ACT inhaler Inhale 2 puffs into the lungs every 6 (six) hours as needed for wheezing or shortness of breath. Take 2 puffs every 5-10 minutes up to 6 puffs total over 15 minutes when needed.  Use with a spacer. (Patient taking differently: Inhale 2 puffs into the lungs every 6 (six) hours as needed for wheezing or shortness of breath. Take 2 puffs up to 6 puffs total over 15 minutes when needed.  Use with a spacer.) 1 Inhaler 2  . allopurinol (ZYLOPRIM) 100 MG tablet Take 100 mg by mouth daily.    . carvedilol (COREG) 6.25 MG tablet Take 2 tablets (12.5 mg total) by mouth 2 (two) times daily with a meal. 60 tablet 5  . fenofibrate 54 MG tablet Take 1 tablet (54 mg total) by mouth daily. 30 tablet 6  . Multiple Vitamin (MULTIVITAMIN WITH MINERALS) TABS tablet Take 1 tablet by mouth daily. Men's One a Day    . naproxen (NAPROSYN) 500 MG tablet Take 500 mg by mouth  2 (two) times daily as needed for moderate pain (pain).     . Omega-3 Fatty Acids (FISH OIL PO) Take 1 capsule by mouth daily.    . sacubitril-valsartan (ENTRESTO) 24-26 MG Take 1 tablet by mouth 2 (two) times daily. 60 tablet 5  . spironolactone (ALDACTONE) 25 MG tablet Take 0.5 tablets (12.5 mg total) by mouth daily. 30 tablet 3  . tetrahydrozoline-zinc (VISINE-AC) 0.05-0.25 % ophthalmic solution Place 2 drops into both eyes daily as needed (for allergy eyes).    [name] aspirin EC 325 MG tablet Take 162.5 mg by mouth daily.    . traMADol (ULTRAM) 50 MG tablet Take 1 tablet (50 mg total) by mouth 2 (two) times daily. (Patient not taking: Reported on 08/29/2015) 60 tablet 0     Physical Exam: right shoulder with painful and restricted motion as noted at recent office visits  Vitals  Temp:  [97.8 F (36.6 C)] 97.8 F  (36.6 C) (06/08 1041) Pulse Rate:  [74-81] 77 (06/08 1135) Resp:  [18-23] 19 (06/08 1135) BP: (123-142)/(75-84) 131/81 mmHg (06/08 1135) SpO2:  [100 %] 100 % (06/08 1135) Weight:  [84.369 kg (186 lb)] 84.369 kg (186 lb) (06/08 1041)  Assessment/Plan  Impression: RIGHT SHOULDER OA   Plan of Action: Procedure(s): RIGHT TOTAL SHOULDER ARTHROPLASTY  Cayce Quezada M Harith Mccadden 09/01/2015, 1:15 PM Contact # [phone]

## 2015-09-01 NOTE — Anesthesia Procedure Notes (Addendum)
Anesthesia Regional Block:  Interscalene brachial plexus block  Pre-Anesthetic Checklist: ,, timeout performed, Correct Patient, Correct Site, Correct Laterality, Correct Procedure, Correct Position, site marked, Risks and benefits discussed,  Surgical consent,  Pre-op evaluation,  At surgeon's request and post-op pain management  Laterality: Right  Prep: chloraprep       Needles:  Injection technique: Single-shot  Needle Type: Echogenic Stimulator Needle     Needle Length: 9cm 9 cm Needle Gauge: 21 and 21 G    Additional Needles:  Procedures: ultrasound guided (picture in chart) and nerve stimulator Interscalene brachial plexus block  Nerve Stimulator or Paresthesia:  Response: deltoid, 0.5 mA,   Additional Responses:   Narrative:  Start time: 09/01/2015 11:21 AM End time: 09/01/2015 11:28 AM Injection made incrementally with aspirations every 5 mL.  Performed by: Personally  Anesthesiologist: Suzette Battiest   Procedure Name: Intubation Date/Time: 09/01/2015 2:09 PM Performed by: Oletta Lamas Pre-anesthesia Checklist: Patient identified, Emergency Drugs available, Suction available and Patient being monitored Patient Re-evaluated:Patient Re-evaluated prior to inductionOxygen Delivery Method: Circle System Utilized Preoxygenation: Pre-oxygenation with 100% oxygen Intubation Type: IV induction Ventilation: Mask ventilation without difficulty Laryngoscope Size: Mac and 3 Grade View: Grade I Tube type: Oral Number of attempts: 1 Airway Equipment and Method: Stylet Placement Confirmation: ETT inserted through vocal cords under direct vision,  positive ETCO2 and breath sounds checked- equal and bilateral Secured at: 24 cm Tube secured with: Tape Dental Injury: Teeth and Oropharynx as per pre-operative assessment

## 2015-09-01 NOTE — Anesthesia Preprocedure Evaluation (Deleted)
Anesthesia Evaluation  Patient identified by MRN, date of birth, ID band Patient awake    Reviewed: Allergy & Precautions, NPO status , Patient's Chart, lab work & pertinent test results, reviewed documented beta blocker date and time   Airway        Dental   Pulmonary asthma , Current Smoker,           Cardiovascular hypertension, Pt. on medications and Pt. on home beta blockers + CAD, + Peripheral Vascular Disease and +CHF    11/16: Left ventricle: The cavity size was mildly dilated. Systolic  function was severely reduced. The estimated ejection fraction  was in the range of 25% to 30%. Diffuse hypokinesis. Regional  wall motion abnormalities cannot be excluded. Doppler parameters  are consistent with abnormal left ventricular relaxation (grade 1  diastolic dysfunction). - Left atrium: The atrium was normal in size. - Right ventricle: Systolic function was normal. - Pulmonary arteries: Systolic pressure could not be estimated   Neuro/Psych negative neurological ROS     GI/Hepatic negative GI ROS, Neg liver ROS,   Endo/Other  diabetes, Type 2  Renal/GU CRFRenal disease     Musculoskeletal  (+) Arthritis ,   Abdominal   Peds  Hematology negative hematology ROS (+)   Anesthesia Other Findings   Reproductive/Obstetrics                            Lab Results  Component Value Date   WBC 6.5 08/29/2015   HGB 13.9 08/29/2015   HCT 43.2 08/29/2015   MCV 90.8 08/29/2015   PLT 218 08/29/2015   Lab Results  Component Value Date   CREATININE 1.30* 08/29/2015   BUN 16 08/29/2015   NA 137 08/29/2015   K 5.2* 08/29/2015   CL 102 08/29/2015   CO2 26 08/29/2015    Anesthesia Physical Anesthesia Plan  ASA: IV  Anesthesia Plan: General and Regional   Post-op Pain Management: GA combined w/ Regional for post-op pain   Induction: Intravenous  Airway Management Planned: Oral  ETT  Additional Equipment: Arterial line  Intra-op Plan:   Post-operative Plan: Extubation in OR  Informed Consent: I have reviewed the patients History and Physical, chart, labs and discussed the procedure including the risks, benefits and alternatives for the proposed anesthesia with the patient or authorized representative who has indicated his/her understanding and acceptance.   Dental advisory given  Plan Discussed with: CRNA  Anesthesia Plan Comments:         Anesthesia Quick Evaluation

## 2015-09-02 ENCOUNTER — Encounter (HOSPITAL_COMMUNITY): Payer: Self-pay | Admitting: General Practice

## 2015-09-02 LAB — BASIC METABOLIC PANEL
ANION GAP: 9 (ref 5–15)
BUN: 19 mg/dL (ref 6–20)
CHLORIDE: 104 mmol/L (ref 101–111)
CO2: 23 mmol/L (ref 22–32)
CREATININE: 1.3 mg/dL — AB (ref 0.61–1.24)
Calcium: 8.3 mg/dL — ABNORMAL LOW (ref 8.9–10.3)
GFR calc non Af Amer: 59 mL/min — ABNORMAL LOW (ref >60)
Glucose, Bld: 115 mg/dL — ABNORMAL HIGH (ref 65–99)
POTASSIUM: 4.1 mmol/L (ref 3.5–5.1)
SODIUM: 136 mmol/L (ref 135–145)

## 2015-09-02 MED ORDER — OXYCODONE-ACETAMINOPHEN 5-325 MG PO TABS: 1.0000 | ORAL_TABLET | ORAL | Status: DC | PRN

## 2015-09-02 MED ORDER — DIAZEPAM 5 MG PO TABS: 2.5000 mg | ORAL_TABLET | Freq: Four times a day (QID) | ORAL | Status: DC | PRN

## 2015-09-02 MED ORDER — ONDANSETRON HCL 4 MG PO TABS: 4.0000 mg | ORAL_TABLET | Freq: Three times a day (TID) | ORAL | Status: DC | PRN

## 2015-09-02 MED ORDER — ASPIRIN EC 81 MG PO TBEC
162.0000 mg | DELAYED_RELEASE_TABLET | Freq: Every day | ORAL | Status: DC
  Administered 2015-09-02: 162 mg via ORAL
  Filled 2015-09-02: qty 2

## 2015-09-02 MED FILL — diazePAM 5 MG TABS: 5 | 13 days supply | Qty: 40 | Fill #0

## 2015-09-02 MED FILL — OXYCODONE/APAP 5-325: 5-325 | 5 days supply | Qty: 60 | Fill #0

## 2015-09-02 MED FILL — ONDANSETRON HCL 4 MG TABLET: 4 | 7 days supply | Qty: 20 | Fill #0

## 2015-09-02 NOTE — Progress Notes (Signed)
Discharge instructions given. Pt verbalized understanding and all questions were answered.  

## 2015-09-02 NOTE — Anesthesia Postprocedure Evaluation (Signed)
Anesthesia Post Note  Patient: Travis [name]  Procedure(s) Performed: Procedure(s) (LRB): RIGHT TOTAL SHOULDER ARTHROPLASTY (Right)  Patient location during evaluation: PACU Anesthesia Type: General and Regional Level of consciousness: awake and alert Pain management: pain level controlled Vital Signs Assessment: post-procedure vital signs reviewed and stable Respiratory status: spontaneous breathing, nonlabored ventilation, respiratory function stable and patient connected to nasal cannula oxygen Cardiovascular status: blood pressure returned to baseline and stable Postop Assessment: no signs of nausea or vomiting Anesthetic complications: no    Last Vitals:  Filed Vitals:   09/02/15 0011 09/02/15 0430  BP: 122/72 114/73  Pulse: 81 87  Temp: 36.6 C 37.1 C  Resp: 14 14    Last Pain:  Filed Vitals:   09/02/15 0851  PainSc: 8                  Tiajuana Amass

## 2015-09-02 NOTE — Discharge Instructions (Signed)

## 2015-09-02 NOTE — Op Note (Signed)
NAMEARIYAN, SOTELLO NO.:  [id number]  MEDICAL RECORD NO.:  [id number]  LOCATION:  5N29C                        FACILITY:  [facility]  PHYSICIAN:  Metta Clines. Kort Stettler, M.D.  DATE OF BIRTH:  February 09, 1956  DATE OF PROCEDURE: DATE OF DISCHARGE:                              OPERATIVE REPORT   PREOP DIAGNOSIS:  End-stage right shoulder osteoarthritis.  POSTOP DIAGNOSIS:  End-stage right shoulder osteoarthritis.  PROCEDURES:  A right total shoulder arthroplasty utilizing a press-fit size 13 Arthrex stem with a 56 x 24 head and a cemented pegged large glenoid.  SURGEON:  Metta Clines. Mayzie Caughlin, M.D.  ASSISTANT:  Reather Laurence Shuford, PA-C.  ANESTHESIA:  General endotracheal as well as an interscalene block.  ESTIMATED BLOOD LOSS:  200 mL.  DRAINS:  None.  HISTORY:  Mr. Parman is a 60 year old gentleman who has had chronic and progressive increasing right shoulder pain with limited motion and functional limitations related to end-stage glenohumeral arthritis.  He is brought to the operating room at this time for planned right total shoulder arthroplasty.  Preoperatively, I counseled Mr Szalkowski regarding treatment options and potential risks versus benefits thereof.  Possible surgical complications were all reviewed including bleeding, infection, neurovascular injury, persistent pain, loss of motion, anesthetic complication, failure of the implant, and possible need for additional surgery.  He understands and accepts and agrees with our planned procedure.  PROCEDURE IN DETAIL:  After undergoing routine preop evaluation, the patient received prophylactic antibiotics and an interscalene block was established in the holding area by the Anesthesia Department.  Placed supine on the operating table.  Underwent smooth induction of a general endotracheal anesthesia.  Placed in a beach chair position and appropriately padded and protected.  The right shoulder girdle region was then  sterilely prepped and draped in standard fashion.  Time-out was called.  An anterior deltopectoral approach of the right shoulder was made through an 8 cm incision.  Skin flaps were elevated. Electrocautery was used for hemostasis.  Dissection carried deeply with the cephalic vein identified.  The deltopectoral interval was then developed bluntly with the vein taken laterally.  The upper centimeter of the pec major was tenotomized to enhance exposure.  The conjoined tendon was identified, mobilized, retracted medially.  We divided adhesions beneath the deltoid and placed self-retaining retractors.  At this point, the long head biceps tendon was then unroofed and tenotomized for later tenodesis.  The subscapularis was then divided away from the lesser tuberosity leaving a 1.5 cm cuff tissue for later repair and it was then carefully mobilized.  The free margin tagged with a series of grasping #2 FiberWire sutures to be used for later repair. We then divided the capsule tissues from anteroinferior and inferior aspects of the humeral head which allowed [place] to deliver the head completely through the wound beginning circumferential exposure.  The rotator cuff was carefully protected superiorly and posteriorly.  We used the extramedullary guide to then outline our proposed humeral head resection.  This was then performed using oscillating saw, reproducing the native retroversion of approximately 30 degrees.  We then used a rongeur to remove the large osteophytes on the anterior and inferior aspects of  the humeral metaphysis.  At this point, we then prepared the humerus for hand reaming up to size 7 and we broached all the way up to size 13 which obtained excellent fit and fixation.  A size 12 trial with a metal cap was then placed into the humerus to protect the metaphyseal cut.  At this point, we then used a combination of Fukuda, pitchfork, and snake-tongue retractors to expose the glenoid.  I  performed a circumferential labral resection also removing the proximal stump of the biceps and gaining complete visualization of the periphery of the glenoid.  I placed a guide pin into the center of the glenoid and there was some mild degree of retroversion and this was corrected perhaps 10 degrees of correction.  This measured a size large and the large reamer was then introduced and we reamed the glenoid down to a smooth subchondral bony bed and used a rongeur to remove some extra bone or osteophyte on the inferior margin of the glenoid.  Once we were pleased with the overall contour and preparation, we then placed our central drill hole followed by the superior and inferior drill holes and slots respectively.  Trial reduction with the glenoid showed excellent alignment and position.  The glenoid was then copiously irrigated, cleaned and dried.  Cement was mixed and introduced into the superior and inferior holes and the slots respectively.  We then impacted our size large glenoid which obtained excellent fit and fixation.  At this point, we then returned our attention to the humeral metaphysis where we cleaned the canal.  We impacted the size 13 stem with excellent bony fit and fixation and good stability.  The proximal screws were then tightened maintaining excellent overall position alignment.  We then performed a series of trial reductions and ultimately felt that the 56 x 24 head gave [place] the best coverage and excellent soft tissue balance with 50% translation of the humeral head on the glenoid posteriorly.  The final 56 x 24 head was then impacted, final reduction was performed after we irrigated the joint.  At this point, we repaired the subscapularis back to the lesser tuberosity through the cuff of tissue using #3 FiberWire, repaired the rotator interval with a pair of figure- of-eight #2 FiberWire sutures and also performed a biceps tenodesis at the upper border of the  pectoralis major.  The wound was again copiously irrigated.  Hemostasis was obtained.  The deltopectoral interval was then closed with a series of interrupted figure-of-eight #1 Vicryl sutures, 2-0 Vicryl used for the subcu layer, intracuticular 3-0 Monocryl for the skin followed by Dermabond and Aquacel dressing.  Right arm was then placed into a sling.  The patient was awakened, extubated, and taken to recovery room in stable condition.  Reather Laurence Shuford, PA-C was used as an Environmental consultant throughout this case essential for help with positioning the patient, positioning the extremity, tissue retraction, manipulation of soft tissues, implantation of prosthesis, wound closure, and intraoperative decision making.     Metta Clines. Dayra Rapley, M.D.     KMS/MEDQ  D:  09/01/2015  T:  09/02/2015  Job:  [id number]

## 2015-09-02 NOTE — Evaluation (Signed)
Occupational Therapy Evaluation and Discharge Patient Details Name: Travis [name] MRN: [id number] DOB: 1956/03/11 Today's Date: 09/02/2015    History of Present Illness s/p R TSA   Clinical Impression   Pt educated in passive protocol, sling use, positioning R UE in bed and chair and compensatory strategies for ADL. Pt moving well. Requested OT run through exercises x 2 to make sure he understood them.  No further OT needs.    Follow Up Recommendations  No OT follow up    Equipment Recommendations  None recommended by OT    Recommendations for Other Services       Precautions / Restrictions Precautions Precautions: Shoulder Type of Shoulder Precautions: passive protocol Shoulder Interventions: Shoulder sling/immobilizer;At all times (may come out when in controlled setting) Precaution Booklet Issued: Yes (comment) Required Braces or Orthoses: Sling Restrictions Weight Bearing Restrictions: Yes RUE Weight Bearing: Non weight bearing      Mobility Bed Mobility Overal bed mobility: Modified Independent                Transfers Overall transfer level: Independent                    Balance                                            ADL Overall ADL's : Modified independent                                       General ADL Comments: instructed pt in compensatory strategies for bathing,dressing. Educated in donning and doffing sling. Instructed in positioning R UE in bed and in chair. Pt aware that he is not to weight bear on R UE.     Vision     Perception     Praxis      Pertinent Vitals/Pain Pain Assessment: 0-10 Pain Score: 6  Pain Location: R shoulder Pain Descriptors / Indicators: Operative site guarding;Guarding;Grimacing;Sore Pain Intervention(s): Monitored during session;Premedicated before session;Repositioned;Ice applied     Hand Dominance Right   Extremity/Trunk Assessment Upper Extremity  Assessment Upper Extremity Assessment: RUE deficits/detail RUE Deficits / Details: performed passive FF to 60, abd to 45 and ER to neutral (pt's tolerance), pendulums and AROM elbow to hand each 10 x 2 RUE Coordination: decreased gross motor   Lower Extremity Assessment Lower Extremity Assessment: Overall WFL for tasks assessed   Cervical / Trunk Assessment Cervical / Trunk Assessment: Normal   Communication Communication Communication: No difficulties   Cognition Arousal/Alertness: Awake/alert Behavior During Therapy: WFL for tasks assessed/performed Overall Cognitive Status: Within Functional Limits for tasks assessed                     General Comments       Exercises       Shoulder Instructions      Home Living Family/patient expects to be discharged to:: Private residence Living Arrangements: Other relatives (niece and nephew) Available Help at Discharge: Family;Personal care attendant                                    Prior Functioning/Environment Level of Independence: Independent  OT Diagnosis: Generalized weakness;Acute pain   OT Problem List:     OT Treatment/Interventions:      OT Goals(Current goals can be found in the care plan section) Acute Rehab OT Goals Patient Stated Goal: to return home  OT Frequency:     Barriers to D/C:            Co-evaluation              End of Session    Activity Tolerance: Patient tolerated treatment well Patient left: in chair;with call bell/phone within reach   Time: 0927-1014 OT Time Calculation (min): 47 min Charges:  OT General Charges $OT Visit: 1 Procedure OT Evaluation $OT Eval Low Complexity: 1 Procedure OT Treatments $Self Care/Home Management : 8-22 mins $Therapeutic Exercise: 8-22 mins G-Codes:    Malka So 09/02/2015, 10:22 AM  [phone]

## 2015-09-02 NOTE — Discharge Summary (Signed)
PATIENT ID:      Travis [name]  MRN:     [id number] DOB/AGE:    February 05, 1956 / 60 y.o.     DISCHARGE SUMMARY  ADMISSION DATE:    09/01/2015 DISCHARGE DATE:    ADMISSION DIAGNOSIS: RIGHT SHOULDER OA  Past Medical History  Diagnosis Date  . Chronic systolic CHF (congestive heart failure) ([place])     a. EF 20-25% by echo 12/28/10; b improved to 45-50% on echo in June 2015  . Nonischemic cardiomyopathy ([place])     minimal coronary disease by cath 12/29/10  . Hemoptysis     secondary to pulmonary edema  . Hyperlipidemia   . Chronic anemia   . GI bleed     15 years ago  . Tobacco abuse   . Alcohol abuse   . Osteoarthritis   . Gout   . Diabetes mellitus, type 2 ([place])     pt reports his DM is gone  . 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 Coronary artery disease     Cardiac catheterization in November 2016 showed mild 30% proximal LAD stenosis with no evidence of obstructive disease.  . 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 Pneumonia   . Asthma   . Vision loss     peripherial vision only left eye.[facility]  artery occusion   . Central retinal artery occlusion of left eye 09/13/13    DISCHARGE DIAGNOSIS:   Active Problems:   S/P shoulder replacement   PROCEDURE: Procedure(s): RIGHT TOTAL SHOULDER ARTHROPLASTY on 09/01/2015  CONSULTS:     HISTORY:  See H&P in chart.  HOSPITAL COURSE:  Travis [name] is a 60 y.o. admitted on 09/01/2015 with a diagnosis of RIGHT SHOULDER OA .  They were brought to the operating room on 09/01/2015 and underwent Procedure(s): RIGHT TOTAL SHOULDER ARTHROPLASTY.    They were given perioperative antibiotics: Anti-infectives    Start     Dose/Rate Route Frequency Ordered Stop   09/01/15 1815  ceFAZolin (ANCEF) IVPB 2g/100 mL premix     2 g 200 mL/hr over 30 Minutes Intravenous Every 6 hours 09/01/15 1743 09/02/15 0616   09/01/15 1200  ceFAZolin (ANCEF) IVPB 2g/100 mL premix  Status:  Discontinued     2 g 200 mL/hr over 30 Minutes Intravenous On call to O.R. 09/01/15 1033 09/01/15 1729   09/01/15 1041  ceFAZolin (ANCEF) 2-4 GM/100ML-% IVPB    Comments:  Sammuel Cooper   : cabinet override      09/01/15 1041 09/01/15 1445    .  Patient underwent the above named procedure and tolerated it well. The following day they were hemodynamically stable and pain was controlled on oral analgesics. They were neurovascularly intact to the operative extremity. OT was ordered and worked with patient per protocol. They were medically and orthopaedically stable for discharge on day 1.     DIAGNOSTIC STUDIES:  RECENT RADIOGRAPHIC STUDIES :  No results found.  RECENT VITAL SIGNS:  Patient Vitals for the past 24 hrs:  BP Temp Temp src Pulse Resp SpO2 Weight  09/02/15 0430 114/73 mmHg 98.7 F (37.1 C) Oral 87 14 99 % -  09/02/15 0011 122/72 mmHg 97.9 F (36.6 C) - 81 14 99 % -  09/01/15 1758 129/79 mmHg 97.9 F (36.6 C) Oral 77 14 99 % -  09/01/15 1734 - 97.6 F (36.4 C) - - - - -  09/01/15 1730 107/79 mmHg - - 76 12 100 % -  09/01/15 1715 134/84 mmHg - - 71 11 100 % -  09/01/15 1700 138/87 mmHg - - 70 18 100 % -  09/01/15 1644 (!) 148/86 mmHg - - 71 20 100 % -  09/01/15 1628 133/74 mmHg - - 67 (!) 22 100 % -  09/01/15 1613 112/75 mmHg 97.7 F (36.5 C) - 71 (!) 22 100 % -  09/01/15 1135 131/81 mmHg - - 77 19 100 % -  09/01/15 1130 123/76 mmHg - - 81 18 100 % -  09/01/15 1125 127/75 mmHg - - 76 (!) 23 100 % -  09/01/15 1120 (!) 142/75 mmHg - - 74 20 100 % -  09/01/15 1041 (!) 141/84 mmHg 97.8 F (36.6 C) Oral 75 18 100 % 84.369 kg (186 lb)  .  RECENT EKG RESULTS:    Orders placed or performed in visit on 05/23/15  . EKG 12-Lead    DISCHARGE INSTRUCTIONS:  Discharge Instructions    Discontinue IV    Complete by:  As directed            DISCHARGE MEDICATIONS:     Medication List    TAKE these medications        albuterol 108 (90 Base) MCG/ACT inhaler  Commonly known as:   PROVENTIL HFA;VENTOLIN HFA  Inhale 2 puffs into the lungs every 6 (six) hours as needed for wheezing or shortness of breath. Take 2 puffs every 5-10 minutes up to 6 puffs total over 15 minutes when needed.  Use with a spacer.     allopurinol 100 MG tablet  Commonly known as:  ZYLOPRIM  Take 100 mg by mouth daily.     aspirin EC 325 MG tablet  Take 162.5 mg by mouth daily.     carvedilol 6.25 MG tablet  Commonly known as:  COREG  Take 2 tablets (12.5 mg total) by mouth 2 (two) times daily with a meal.     diazepam 5 MG tablet  Commonly known as:  VALIUM  Take 0.5-1 tablets (2.5-5 mg total) by mouth every 6 (six) hours as needed for muscle spasms or sedation.     fenofibrate 54 MG tablet  Take 1 tablet (54 mg total) by mouth daily.     FISH OIL PO  Take 1 capsule by mouth daily.     multivitamin with minerals Tabs tablet  Take 1 tablet by mouth daily. Men's One a Day     naproxen 500 MG tablet  Commonly known as:  NAPROSYN  Take 500 mg by mouth 2 (two) times daily as needed for moderate pain (pain).     ondansetron 4 MG tablet  Commonly known as:  ZOFRAN  Take 1 tablet (4 mg total) by mouth every 8 (eight) hours as needed for nausea or vomiting.     oxyCODONE-acetaminophen 5-325 MG tablet  Commonly known as:  PERCOCET  Take 1-2 tablets by mouth every 4 (four) hours as needed.     sacubitril-valsartan 24-26 MG  Commonly known as:  ENTRESTO  Take 1 tablet by mouth 2 (two) times daily.     spironolactone 25 MG tablet  Commonly known as:  ALDACTONE  Take 0.5 tablets (12.5 mg total) by mouth daily.     tetrahydrozoline-zinc 0.05-0.25 % ophthalmic solution  Commonly known as:  VISINE-AC  Place 2 drops into both eyes daily as needed (for allergy eyes).     traMADol 50 MG tablet  Commonly known as:  ULTRAM  Take 1 tablet (50 mg  total) by mouth 2 (two) times daily.        FOLLOW UP VISIT:       Follow-up Information    Follow up with Metta Clines SUPPLE, MD.    Specialty:  Orthopedic Surgery   Why:  call to be seen in 10-14 days   Contact information:   [address] [place] [address] [id number]       DISCHARGE TO: Home   DISPOSITION: Good  DISCHARGE CONDITION:  Festus Barren for Dr. [name] 09/02/2015, 8:03 AM

## 2015-09-06 ENCOUNTER — Encounter: Payer: Self-pay | Admitting: Emergency Medicine

## 2015-09-06 ENCOUNTER — Emergency Department: Payer: Medicare Other

## 2015-09-06 ENCOUNTER — Emergency Department
Admission: EM | Admit: 2015-09-06 | Discharge: 2015-09-06 | Disposition: A | Discharge status: Home / Self Care | Payer: Medicare Other | Attending: Emergency Medicine | Admitting: Emergency Medicine

## 2015-09-06 DIAGNOSIS — R109 Unspecified abdominal pain: Secondary | ICD-10-CM | POA: Diagnosis not present

## 2015-09-06 DIAGNOSIS — Z7982 Long term (current) use of aspirin: Secondary | ICD-10-CM | POA: Diagnosis not present

## 2015-09-06 DIAGNOSIS — Z79899 Other long term (current) drug therapy: Secondary | ICD-10-CM | POA: Insufficient documentation

## 2015-09-06 DIAGNOSIS — I5022 Chronic systolic (congestive) heart failure: Secondary | ICD-10-CM | POA: Diagnosis not present

## 2015-09-06 DIAGNOSIS — I2510 Atherosclerotic heart disease of native coronary artery without angina pectoris: Secondary | ICD-10-CM | POA: Insufficient documentation

## 2015-09-06 DIAGNOSIS — J45909 Unspecified asthma, uncomplicated: Secondary | ICD-10-CM | POA: Diagnosis not present

## 2015-09-06 DIAGNOSIS — E785 Hyperlipidemia, unspecified: Secondary | ICD-10-CM | POA: Diagnosis not present

## 2015-09-06 DIAGNOSIS — I429 Cardiomyopathy, unspecified: Secondary | ICD-10-CM | POA: Insufficient documentation

## 2015-09-06 DIAGNOSIS — F17210 Nicotine dependence, cigarettes, uncomplicated: Secondary | ICD-10-CM | POA: Diagnosis not present

## 2015-09-06 DIAGNOSIS — E119 Type 2 diabetes mellitus without complications: Secondary | ICD-10-CM | POA: Insufficient documentation

## 2015-09-06 DIAGNOSIS — M1990 Unspecified osteoarthritis, unspecified site: Secondary | ICD-10-CM | POA: Diagnosis not present

## 2015-09-06 DIAGNOSIS — I110 Hypertensive heart disease with heart failure: Secondary | ICD-10-CM | POA: Diagnosis not present

## 2015-09-06 DIAGNOSIS — K5900 Constipation, unspecified: Secondary | ICD-10-CM | POA: Diagnosis not present

## 2015-09-06 DIAGNOSIS — F1290 Cannabis use, unspecified, uncomplicated: Secondary | ICD-10-CM | POA: Insufficient documentation

## 2015-09-06 LAB — CBC WITH DIFFERENTIAL/PLATELET
BASOS PCT: 0 %
Basophils Absolute: 0 10*3/uL (ref 0–0.1)
EOS ABS: 1.3 10*3/uL — AB (ref 0–0.7)
EOS PCT: 15 %
HCT: 32.3 % — ABNORMAL LOW (ref 40.0–52.0)
Hemoglobin: 11 g/dL — ABNORMAL LOW (ref 13.0–18.0)
LYMPHS ABS: 1.9 10*3/uL (ref 1.0–3.6)
Lymphocytes Relative: 22 %
MCH: 30.6 pg (ref 26.0–34.0)
MCHC: 34.1 g/dL (ref 32.0–36.0)
MCV: 89.9 fL (ref 80.0–100.0)
MONOS PCT: 5 %
Monocytes Absolute: 0.4 10*3/uL (ref 0.2–1.0)
Neutro Abs: 5.2 10*3/uL (ref 1.4–6.5)
Neutrophils Relative %: 58 %
PLATELETS: 277 10*3/uL (ref 150–440)
RBC: 3.6 MIL/uL — AB (ref 4.40–5.90)
RDW: 14.5 % (ref 11.5–14.5)
WBC: 8.8 10*3/uL (ref 3.8–10.6)

## 2015-09-06 LAB — COMPREHENSIVE METABOLIC PANEL
ALBUMIN: 3.7 g/dL (ref 3.5–5.0)
ALT: 28 U/L (ref 17–63)
ANION GAP: 9 (ref 5–15)
AST: 34 U/L (ref 15–41)
Alkaline Phosphatase: 91 U/L (ref 38–126)
BUN: 21 mg/dL — ABNORMAL HIGH (ref 6–20)
CHLORIDE: 105 mmol/L (ref 101–111)
CO2: 24 mmol/L (ref 22–32)
Calcium: 9.5 mg/dL (ref 8.9–10.3)
Creatinine, Ser: 1.21 mg/dL (ref 0.61–1.24)
GFR calc non Af Amer: >60 mL/min (ref >60)
Glucose, Bld: 112 mg/dL — ABNORMAL HIGH (ref 65–99)
POTASSIUM: 4.3 mmol/L (ref 3.5–5.1)
SODIUM: 138 mmol/L (ref 135–145)
Total Bilirubin: 0.6 mg/dL (ref 0.3–1.2)
Total Protein: 7 g/dL (ref 6.5–8.1)

## 2015-09-06 MED ORDER — LACTULOSE 10 GM/15ML PO SOLN
10.0000 g | Freq: Once | ORAL | Status: AC
  Administered 2015-09-06: 10 g via ORAL
  Filled 2015-09-06: qty 15

## 2015-09-06 MED ORDER — DOCUSATE SODIUM 50 MG/5ML PO LIQD
100.0000 mg | Freq: Every day | ORAL | Status: DC
  Administered 2015-09-06: 100 mg via ORAL
  Filled 2015-09-06: qty 10

## 2015-09-06 MED ORDER — MAGNESIUM CITRATE PO SOLN
0.5000 | Freq: Once | ORAL | Status: AC
  Administered 2015-09-06: 0.5 via ORAL
  Filled 2015-09-06 (×2): qty 296

## 2015-09-06 MED ORDER — DOCUSATE SODIUM 100 MG PO CAPS: 100.0000 mg | ORAL_CAPSULE | Freq: Two times a day (BID) | ORAL | Status: DC | PRN

## 2015-09-06 MED ORDER — DOCUSATE SODIUM 100 MG PO CAPS
100.0000 mg | ORAL_CAPSULE | Freq: Once | ORAL | Status: DC
  Filled 2015-09-06: qty 1

## 2015-09-06 MED ORDER — LACTULOSE 10 GM/15ML PO SOLN
ORAL | Status: AC
  Administered 2015-09-06: 10 g via ORAL
  Filled 2015-09-06: qty 30

## 2015-09-06 NOTE — ED Provider Notes (Signed)
[facility] Emergency Department Provider Note   ____________________________________________  Time seen: Approximately 5 PM  I have reviewed the triage vital signs and the nursing notes.   HISTORY  Chief Complaint Constipation   HPI Travis [name] is a 60 y.o. male who had a right shoulder replacement secondary to arthritis this past Thursday who has not had a bowel movement over the past 4 days. He says he usually has 1-2 bowel movements per day. Denies any blood in the stool. Says that he tried a stool softener home without any relief. Denies any bleeding per rectum. Says that he has abdominal fullness but no pain.   Past Medical History  Diagnosis Date  . Chronic systolic CHF (congestive heart failure) ([place])     a. EF 20-25% by echo 12/28/10; b improved to 45-50% on echo in June 2015  . Nonischemic cardiomyopathy ([place])     minimal coronary disease by cath 12/29/10  . Hemoptysis     secondary to pulmonary edema  . Hyperlipidemia   . Chronic anemia   . GI bleed     15 years ago  . Tobacco abuse   . Alcohol abuse   . Osteoarthritis   . Gout   . Diabetes mellitus, type 2 ([place])     pt reports his DM is gone  . 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 Coronary artery disease     Cardiac catheterization in November 2016 showed mild 30% proximal LAD stenosis with no evidence of obstructive disease.  . 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 Pneumonia   . Asthma   . Vision loss     peripherial vision only left eye.Centra Retinla  artery occusion   . Central retinal artery occlusion of left eye 09/13/13    Patient Active Problem List   Diagnosis Date Noted  . S/P shoulder replacement 09/01/2015  . Rotator cuff syndrome of right shoulder 08/24/2015  . Essential hypertension 04/18/2015  . 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 Literacy level of illiterate 06/04/2014  . CRA (central  retinal artery occlusion) 09/13/2013  . Cardiomyopathy, nonischemic ([place]) 03/15/2013  . Smoker 03/14/2013  . Alcoholism in remission ([place]) 03/14/2013  . Chronic systolic heart failure ([place]) 01/05/2011  . DM2 (diabetes mellitus, type 2) ([place]) 11/30/2008  . HLD (hyperlipidemia) 11/30/2008  . Gout 11/30/2008  . Iron deficiency anemia, unspecified 11/30/2008    Past Surgical History  Procedure Laterality Date  . Cardiac catheterization    . Hip arthroplasty Right 03/15/2013    Procedure: ARTHROPLASTY BIPOLAR HIP;  Surgeon: [name], MD;  Location: [place];  Service: Orthopedics;  Laterality: Right;  . Cardiac catheterization N/A 01/28/2015    Procedure: Left Heart Cath and Coronary Angiography;  Surgeon: Wellington Hampshire, MD;  Location: [place] [facility];  Service: Cardiovascular;  Laterality: N/A;  . Colonoscopy    . Total shoulder arthroplasty Right 09/01/2015  . Total shoulder arthroplasty Right 09/01/2015    Procedure: RIGHT TOTAL SHOULDER ARTHROPLASTY;  Surgeon: [name], MD;  Location: [place];  Service: Orthopedics;  Laterality: Right;    Current Outpatient Rx  Name  Route  Sig  Dispense  Refill  . albuterol (PROVENTIL HFA;VENTOLIN HFA) 108 (90 BASE) MCG/ACT inhaler   Inhalation   Inhale 2 puffs into the lungs every 6 (six) hours as needed for wheezing or shortness of breath. Take 2 puffs every 5-10 minutes up to 6 puffs total  over 15 minutes when needed.  Use with a spacer. Patient taking differently: Inhale 2 puffs into the lungs every 6 (six) hours as needed for wheezing or shortness of breath. Take 2 puffs up to 6 puffs total over 15 minutes when needed.  Use with a spacer.   1 Inhaler   2   . allopurinol (ZYLOPRIM) 100 MG tablet   Oral   Take 100 mg by mouth daily.         [name] aspirin EC 325 MG tablet   Oral   Take 162.5 mg by mouth daily.         . carvedilol (COREG) 6.25 MG tablet   Oral   Take 2 tablets (12.5 mg total) by mouth 2 (two) times  daily with a meal.   60 tablet   5   . diazepam (VALIUM) 5 MG tablet   Oral   Take 0.5-1 tablets (2.5-5 mg total) by mouth every 6 (six) hours as needed for muscle spasms or sedation.   40 tablet   1   . fenofibrate 54 MG tablet   Oral   Take 1 tablet (54 mg total) by mouth daily.   30 tablet   6   . Multiple Vitamin (MULTIVITAMIN WITH MINERALS) TABS tablet   Oral   Take 1 tablet by mouth daily. Men's One a Day         . naproxen (NAPROSYN) 500 MG tablet   Oral   Take 500 mg by mouth 2 (two) times daily as needed for moderate pain (pain).          . Omega-3 Fatty Acids (FISH OIL PO)   Oral   Take 1 capsule by mouth daily.         . ondansetron (ZOFRAN) 4 MG tablet   Oral   Take 1 tablet (4 mg total) by mouth every 8 (eight) hours as needed for nausea or vomiting.   20 tablet   0   . oxyCODONE-acetaminophen (PERCOCET) 5-325 MG tablet   Oral   Take 1-2 tablets by mouth every 4 (four) hours as needed.   60 tablet   0   . sacubitril-valsartan (ENTRESTO) 24-26 MG   Oral   Take 1 tablet by mouth 2 (two) times daily.   60 tablet   5   . spironolactone (ALDACTONE) 25 MG tablet   Oral   Take 0.5 tablets (12.5 mg total) by mouth daily.   30 tablet   3   . tetrahydrozoline-zinc (VISINE-AC) 0.05-0.25 % ophthalmic solution   Both Eyes   Place 2 drops into both eyes daily as needed (for allergy eyes).         . traMADol (ULTRAM) 50 MG tablet   Oral   Take 1 tablet (50 mg total) by mouth 2 (two) times daily. Patient not taking: Reported on 08/29/2015   60 tablet   0     Allergies Review of patient's allergies indicates no known allergies.  Family History  Problem Relation Age of Onset  . Diabetes Mother   . Hypertension Mother   . Diabetes Father   . Hypertension Father   . Cancer Neg Hx   . Heart disease Neg Hx   . Stroke Neg Hx     Social History Social History  Substance Use Topics  . Smoking status: Current Every Day Smoker -- 1.00  packs/day for 30 years    Types: Cigarettes  . Smokeless tobacco: Never Used  . Alcohol Use:  0.6 oz/week    1 Glasses of wine per week     Comment: occasionally    Review of Systems Constitutional: No fever/chills Eyes: No visual changes. ENT: No sore throat. Cardiovascular: Denies chest pain. Respiratory: Denies shortness of breath. Gastrointestinal:   No nausea, no vomiting.  No diarrhea.   Genitourinary: Negative for dysuria. Musculoskeletal: Negative for back pain. Skin: Negative for rash. Neurological: Negative for headaches, focal weakness or numbness.  10-point ROS otherwise negative.  ____________________________________________   PHYSICAL EXAM:  VITAL SIGNS: ED Triage Vitals  Enc Vitals Group     BP 09/06/15 1618 141/77 mmHg     Pulse Rate 09/06/15 1618 99     Resp 09/06/15 1618 15     Temp 09/06/15 1618 98.2 F (36.8 C)     Temp Source 09/06/15 1618 Oral     SpO2 09/06/15 1618 97 %     Weight 09/06/15 1618 185 lb (83.915 kg)     Height 09/06/15 1618 6\' 4"  (1.93 m)     Head Cir --      Peak Flow --      Pain Score 09/06/15 1620 8     Pain Loc --      Pain Edu? --      Excl. in [place]? --     Constitutional: Alert and oriented. Well appearing and in no acute distress. Eyes: Conjunctivae are normal. PERRL. EOMI. Head: Atraumatic. Nose: No congestion/rhinnorhea. Mouth/Throat: Mucous membranes are moist.   Neck: No stridor.   Cardiovascular: Normal rate, regular rhythm. Grossly normal heart sounds.  Respiratory: Normal respiratory effort.  No retractions. Lungs CTAB. Gastrointestinal: Soft and nontender. No distention.Rectal exam with moderate amount of hard stool high in the vault. No low fecal impaction that I'm able to disimpact. Stool on the glove is grossly brown. Musculoskeletal: No lower extremity tenderness nor edema.  No joint effusions. Wearing right-sided shoulder sling. Neurologic:  Normal speech and language. No gross focal neurologic deficits  are appreciated.  Skin:  Skin is warm, dry and intact. No rash noted. Psychiatric: Mood and affect are normal. Speech and behavior are normal.  ____________________________________________   LABS (all labs ordered are listed, but only abnormal results are displayed)  Labs Reviewed  CBC WITH DIFFERENTIAL/PLATELET - Abnormal; Notable for the following:    RBC 3.60 (*)    Hemoglobin 11.0 (*)    HCT 32.3 (*)    Eosinophils Absolute 1.3 (*)    All other components within normal limits  COMPREHENSIVE METABOLIC PANEL - Abnormal; Notable for the following:    Glucose, Bld 112 (*)    BUN 21 (*)    All other components within normal limits   ____________________________________________  EKG   ____________________________________________  RADIOLOGY   DG Abd Acute W/Chest (Final result) Result time: 09/06/15 17:02:53   Final result by Rad Results In Interface (09/06/15 17:02:53)   Narrative:   CLINICAL DATA: Pt just had shoulder surgery last Thursday and has been taking pain medication; States he has not been able to have a BM in 2.5 days, states he took metamucil with no relief CHF, smoker, diabetes, CAD, asthma  EXAM: DG ABDOMEN ACUTE W/ 1V CHEST  COMPARISON: Chest radiograph, 05/12/2015.  FINDINGS: There is no bowel dilation to suggest obstruction or significant generalized adynamic ileus. Mild colonic and rectal stool burden. There are scattered air-fluid levels on the erect view, nonspecific. There is no free air.  No evidence of renal or ureteral stones. Partly imaged right hip hemiarthroplasty  appears well aligned.  Cardiac silhouette is normal in size and configuration. Normal mediastinal hilar contours.  No lung consolidation edema. Mild lung base scarring or subsegmental atelectasis. Right shoulder hemiarthroplasty is new the prior exam.  IMPRESSION: 1. No acute findings in the abdomen pelvis. No evidence of bowel obstruction or significant adynamic  ileus. 2. Mild colonic and rectal stool burden. 3. No acute cardiopulmonary disease.   Electronically Signed By: Lajean Manes M.D. On: 09/06/2015 17:02       ____________________________________________   PROCEDURES  ____________________________________________   INITIAL IMPRESSION / ASSESSMENT AND PLAN / ED COURSE  Pertinent labs & imaging results that were available during my care of the patient were reviewed by me and considered in my medical decision making (see chart for details).  ----------------------------------------- 6:05 PM on 09/06/2015 -----------------------------------------  After the enema the patient had a large bowel movement and is feeling much improved. He'll be discharged to home. He says that he has been using a Metamucil and I'll switch him to Colace. We also discussed weaning down off of the Percocet and he understands this plan and is willing to comply. ____________________________________________   FINAL CLINICAL IMPRESSION(S) / ED DIAGNOSES  Constipation.    NEW MEDICATIONS STARTED DURING THIS VISIT:  New Prescriptions   No medications on file     Note:  This document was prepared using Dragon voice recognition software and may include unintentional dictation errors.    Orbie Pyo, MD 09/06/15 [phone]

## 2015-09-06 NOTE — ED Notes (Signed)
Pt informed to return if any life threatening symptoms occur.  

## 2015-09-06 NOTE — ED Notes (Signed)
Pt just had shoulder surgery last Thursday and has been taking pain medication.. States he has not been able to have a BM in 2.5 days, states he took metamucil with no relief.

## 2015-09-06 NOTE — ED Notes (Signed)
Large stool resulted from enema. Pt reports he feels better.

## 2015-09-06 NOTE — ED Notes (Signed)
Pharmacy called for liquid colace

## 2015-09-06 NOTE — ED Notes (Signed)
Unable to move bowels.  Last BM 6/10.  Recently had right shoulder surgery and taking percocet for pain and a sleeping pill.

## 2015-09-09 ENCOUNTER — Encounter (HOSPITAL_COMMUNITY): Payer: Self-pay | Admitting: Orthopedic Surgery

## 2015-09-15 DIAGNOSIS — Z96611 Presence of right artificial shoulder joint: Secondary | ICD-10-CM | POA: Diagnosis not present

## 2015-09-15 DIAGNOSIS — Z471 Aftercare following joint replacement surgery: Secondary | ICD-10-CM | POA: Diagnosis not present

## 2015-09-21 DIAGNOSIS — M25511 Pain in right shoulder: Secondary | ICD-10-CM | POA: Diagnosis not present

## 2015-09-23 DIAGNOSIS — M25511 Pain in right shoulder: Secondary | ICD-10-CM | POA: Diagnosis not present

## 2015-09-28 DIAGNOSIS — M25511 Pain in right shoulder: Secondary | ICD-10-CM | POA: Diagnosis not present

## 2015-09-29 ENCOUNTER — Ambulatory Visit (INDEPENDENT_AMBULATORY_CARE_PROVIDER_SITE_OTHER): Payer: Medicare Other | Admitting: Cardiovascular Disease

## 2015-09-29 ENCOUNTER — Encounter: Payer: Self-pay | Admitting: Cardiovascular Disease

## 2015-09-29 VITALS — BP 110/70 | HR 93 | Ht 76.0 in | Wt 187.0 lb

## 2015-09-29 DIAGNOSIS — I10 Essential (primary) hypertension: Secondary | ICD-10-CM | POA: Diagnosis not present

## 2015-09-29 DIAGNOSIS — I5022 Chronic systolic (congestive) heart failure: Secondary | ICD-10-CM

## 2015-09-29 NOTE — Patient Instructions (Signed)
Medication Instructions: Continue same medications.   Labwork: None.   Procedures/Testing: None.   Follow-Up: 6 months with Dr. Murray Durrell.   Any Additional Special Instructions Will Be Listed Below (If Applicable).     If you need a refill on your cardiac medications before your next appointment, please call your pharmacy.   

## 2015-09-29 NOTE — Progress Notes (Signed)
Cardiology Office Note   Date:  09/29/2015   ID:  Travis [name], DOB 07/28/1955, MRN [id number]  PCP:  [name], NP  Cardiologist:   Kathlyn Sacramento, MD   Chief Complaint  Patient presents with  . other    3 month f/u. Meds reviewed verbally with pt.      History of Present Illness: Travis [name] is a 60 y.o. male who presents for a follow up visit.   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 His previous ejection fraction in June 2015 was 45-50%.  Most recent echocardiogram in November 2016 showed worsening LV systolic dysfunction with an ejection fraction of 25-30%. Due to that, he underwent cardiac catheterization which showed mild nonobstructive one-vessel coronary artery disease. LVEDP was mildly elevated. He improved with diuresis. He quit alcohol completely since his hospital discharge.  During last visit, I resumed carvedilol which she stopped taking by mistake. He has been doing well with no chest pain or shortness of breath. He underwent shoulder surgery last month without complications. Unfortunately, he continues to smoke.     Past Medical History  Diagnosis Date  . Chronic systolic CHF (congestive heart failure) ([place])     a. EF 20-25% by echo 12/28/10; b improved to 45-50% on echo in June 2015  . Nonischemic cardiomyopathy ([place])     minimal coronary disease by cath 12/29/10  . Hemoptysis     secondary to pulmonary edema  . Hyperlipidemia   . Chronic anemia   . GI bleed     15 years ago  . Tobacco abuse   . Alcohol abuse   . Osteoarthritis   . Gout   . Diabetes mellitus, type 2 ([place])     pt reports his DM is gone  . 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 Coronary artery disease     Cardiac catheterization in November 2016 showed mild 30% proximal LAD stenosis with no  evidence of obstructive disease.  . 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 Pneumonia   . Asthma   . Vision loss     peripherial vision only left eye.Centra Retinla  artery occusion   . 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 Cardiac catheterization    . Hip arthroplasty Right 03/15/2013    Procedure: ARTHROPLASTY BIPOLAR HIP;  Surgeon: [name], MD;  Location: [place];  Service: Orthopedics;  Laterality: Right;  . Cardiac catheterization N/A 01/28/2015    Procedure: Left Heart Cath and Coronary Angiography;  Surgeon: Wellington Hampshire, MD;  Location: [place] [facility];  Service: Cardiovascular;  Laterality: N/A;  . Colonoscopy    . Total shoulder arthroplasty Right 09/01/2015  . Total shoulder arthroplasty Right 09/01/2015    Procedure: RIGHT TOTAL SHOULDER ARTHROPLASTY;  Surgeon: [name], MD;  Location: [place] [facility];  Service: Orthopedics;  Laterality: Right;     Current Outpatient Prescriptions  Medication Sig Dispense Refill  . albuterol (PROVENTIL HFA;VENTOLIN HFA) 108 (90 BASE) MCG/ACT inhaler Inhale 2 puffs into the lungs every 6 (six) hours as needed for wheezing or shortness of breath. Take 2 puffs every 5-10 minutes up to 6 puffs total over 15 minutes when needed.  Use with a spacer. (Patient taking differently: Inhale 2 puffs into  the lungs every 6 (six) hours as needed for wheezing or shortness of breath. Take 2 puffs up to 6 puffs total over 15 minutes when needed.  Use with a spacer.) 1 Inhaler 2  . allopurinol (ZYLOPRIM) 100 MG tablet Take 100 mg by mouth daily.    [name] aspirin EC 325 MG tablet Take 162.5 mg by mouth daily.    . carvedilol (COREG) 6.25 MG tablet Take 2 tablets (12.5 mg total) by mouth 2 (two) times daily with a meal. 60 tablet 5  . diazepam (VALIUM) 5 MG tablet Take 0.5-1 tablets (2.5-5 mg total) by mouth every 6 (six) hours as needed  for muscle spasms or sedation. 40 tablet 1  . docusate sodium (COLACE) 100 MG capsule Take 1 capsule (100 mg total) by mouth 2 (two) times daily as needed for mild constipation or moderate constipation. 20 capsule 0  . fenofibrate 54 MG tablet Take 1 tablet (54 mg total) by mouth daily. 30 tablet 6  . Multiple Vitamin (MULTIVITAMIN WITH MINERALS) TABS tablet Take 1 tablet by mouth daily. Men's One a Day    . naproxen (NAPROSYN) 500 MG tablet Take 500 mg by mouth 2 (two) times daily as needed for moderate pain (pain).     . Omega-3 Fatty Acids (FISH OIL PO) Take 1 capsule by mouth daily.    . ondansetron (ZOFRAN) 4 MG tablet Take 1 tablet (4 mg total) by mouth every 8 (eight) hours as needed for nausea or vomiting. 20 tablet 0  . oxyCODONE-acetaminophen (PERCOCET) 5-325 MG tablet Take 1-2 tablets by mouth every 4 (four) hours as needed. 60 tablet 0  . sacubitril-valsartan (ENTRESTO) 24-26 MG Take 1 tablet by mouth 2 (two) times daily. 60 tablet 5  . spironolactone (ALDACTONE) 25 MG tablet Take 0.5 tablets (12.5 mg total) by mouth daily. 30 tablet 3  . tetrahydrozoline-zinc (VISINE-AC) 0.05-0.25 % ophthalmic solution Place 2 drops into both eyes daily as needed (for allergy eyes).    . traMADol (ULTRAM) 50 MG tablet Take 1 tablet (50 mg total) by mouth 2 (two) times daily. 60 tablet 0   No current facility-administered medications for this visit.    Allergies:   Review of patient's allergies indicates no known allergies.    Social History:  The patient  reports that he has been smoking Cigarettes.  He has a 30 pack-year smoking history. He has never used smokeless tobacco. He reports that he drinks about 0.6 oz of alcohol per week. He reports that he uses illicit drugs (Marijuana).   Family History:  The patient's family history includes Diabetes in his father and mother; Hypertension in his father and mother. There is no history of Cancer, Heart disease, or Stroke.    ROS:  Please see the  history of present illness.   Otherwise, review of systems are positive for none.   All other systems are reviewed and negative.    PHYSICAL EXAM: VS:  BP 110/70 mmHg  Pulse 93  Ht 6\' 4"  (1.93 m)  Wt 187 lb (84.823 kg)  BMI 22.77 kg/m2 , BMI Body mass index is 22.77 kg/(m^2). GEN: Well nourished, well developed, in no acute distress HEENT: normal Neck: no JVD, carotid bruits, or masses Cardiac: RRR; no murmurs, rubs, or gallops,no edema  Respiratory:  clear to auscultation bilaterally, normal work of breathing GI: soft, nontender, nondistended, + BS MS: no deformity or atrophy Skin: warm and dry, no rash Neuro:  Strength and sensation are intact Psych: euthymic mood, full  affect   EKG:  EKG is not ordered today.    Recent Labs: 01/26/2015: B Natriuretic Peptide 1453.0*; Magnesium 1.8; TSH 0.224* 09/06/2015: ALT 28; BUN 21*; Creatinine, Ser 1.21; Hemoglobin 11.0*; Platelets 277; Potassium 4.3; Sodium 138    Lipid Panel    Component Value Date/Time   CHOL 153 12/31/2013 1403   TRIG 221.0* 12/31/2013 1403   HDL 28.70* 12/31/2013 1403   CHOLHDL 5 12/31/2013 1403   VLDL 44.2* 12/31/2013 1403   LDLCALC UNABLE TO CALCULATE IF TRIGLYCERIDE OVER 400 mg/dL 09/14/2013 0510   LDLDIRECT 87.3 12/31/2013 1403      Wt Readings from Last 3 Encounters:  09/29/15 187 lb (84.823 kg)  09/06/15 185 lb (83.915 kg)  09/01/15 186 lb (84.369 kg)       ASSESSMENT AND PLAN: Travis [name]  1.   Chronic systolic heart failure: Due to suspected alcohol-induced cardiomyopathy. He appears to be euvolemic without loop diuretics.  He is currently on optimal medical therapy including carvedilol, Entresto  and spironolactone.   2. Essential hypertension: Blood pressure is well controlled on current medications.   3. Tobacco use: He continues to smoke and has not been able to quit smoking.     Disposition:   FU with me in 6 months  Signed,  Kathlyn Sacramento, MD  09/29/2015 2:12 PM     [facility]

## 2015-09-30 DIAGNOSIS — M1711 Unilateral primary osteoarthritis, right knee: Secondary | ICD-10-CM | POA: Diagnosis not present

## 2015-10-03 DIAGNOSIS — M25511 Pain in right shoulder: Secondary | ICD-10-CM | POA: Diagnosis not present

## 2015-10-05 DIAGNOSIS — M25511 Pain in right shoulder: Secondary | ICD-10-CM | POA: Diagnosis not present

## 2015-10-10 DIAGNOSIS — M25511 Pain in right shoulder: Secondary | ICD-10-CM | POA: Diagnosis not present

## 2015-10-12 DIAGNOSIS — M25511 Pain in right shoulder: Secondary | ICD-10-CM | POA: Diagnosis not present

## 2015-10-17 DIAGNOSIS — Z471 Aftercare following joint replacement surgery: Secondary | ICD-10-CM | POA: Diagnosis not present

## 2015-10-17 DIAGNOSIS — Z96611 Presence of right artificial shoulder joint: Secondary | ICD-10-CM | POA: Diagnosis not present

## 2015-10-17 DIAGNOSIS — M25511 Pain in right shoulder: Secondary | ICD-10-CM | POA: Diagnosis not present

## 2015-10-19 DIAGNOSIS — M25511 Pain in right shoulder: Secondary | ICD-10-CM | POA: Diagnosis not present

## 2015-10-20 ENCOUNTER — Other Ambulatory Visit: Payer: Self-pay | Admitting: Family

## 2015-10-20 ENCOUNTER — Telehealth: Payer: Self-pay

## 2015-10-20 MED ORDER — CARVEDILOL 12.5 MG PO TABS: 12.5000 mg | ORAL_TABLET | Freq: Two times a day (BID) | ORAL | 5 refills | Status: DC

## 2015-10-20 NOTE — Telephone Encounter (Signed)
[place] for letter stating pt should have apartment on ground level due to chronic orthopedic condition.

## 2015-10-20 NOTE — Telephone Encounter (Signed)
Pt trying to get an apartment on the ground floor due to leg problems and pt request letter that pt needs apartment on ground floor due to leg issues. Pt request cb when letter is ready.

## 2015-10-20 NOTE — Telephone Encounter (Signed)
Pt left vm requesting letter from Avie Echevaria NP to get pt appt about pts health. Left v/m for pt to cb to clarify what is needed.

## 2015-10-24 DIAGNOSIS — M25511 Pain in right shoulder: Secondary | ICD-10-CM | POA: Diagnosis not present

## 2015-10-26 ENCOUNTER — Encounter: Payer: Self-pay | Admitting: Family

## 2015-10-26 DIAGNOSIS — M25511 Pain in right shoulder: Secondary | ICD-10-CM | POA: Diagnosis not present

## 2015-10-27 NOTE — Telephone Encounter (Signed)
Tried to call pt---VM never came on and it seemed as someone picked up phone and hung up---letter is ready for pick up and placed in the front office

## 2015-10-28 NOTE — Telephone Encounter (Signed)
Pt is aware.  

## 2015-10-31 DIAGNOSIS — M25511 Pain in right shoulder: Secondary | ICD-10-CM | POA: Diagnosis not present

## 2015-11-01 DIAGNOSIS — M1711 Unilateral primary osteoarthritis, right knee: Secondary | ICD-10-CM | POA: Diagnosis not present

## 2015-11-02 DIAGNOSIS — M25511 Pain in right shoulder: Secondary | ICD-10-CM | POA: Diagnosis not present

## 2015-11-07 DIAGNOSIS — M25511 Pain in right shoulder: Secondary | ICD-10-CM | POA: Diagnosis not present

## 2015-11-08 DIAGNOSIS — M1711 Unilateral primary osteoarthritis, right knee: Secondary | ICD-10-CM | POA: Diagnosis not present

## 2015-11-09 DIAGNOSIS — M25511 Pain in right shoulder: Secondary | ICD-10-CM | POA: Diagnosis not present

## 2015-11-14 DIAGNOSIS — M25511 Pain in right shoulder: Secondary | ICD-10-CM | POA: Diagnosis not present

## 2015-11-15 DIAGNOSIS — M1711 Unilateral primary osteoarthritis, right knee: Secondary | ICD-10-CM | POA: Diagnosis not present

## 2015-11-16 DIAGNOSIS — M25511 Pain in right shoulder: Secondary | ICD-10-CM | POA: Diagnosis not present

## 2015-11-21 DIAGNOSIS — M25511 Pain in right shoulder: Secondary | ICD-10-CM | POA: Diagnosis not present

## 2015-11-23 DIAGNOSIS — M25511 Pain in right shoulder: Secondary | ICD-10-CM | POA: Diagnosis not present

## 2015-11-29 DIAGNOSIS — M25511 Pain in right shoulder: Secondary | ICD-10-CM | POA: Diagnosis not present

## 2015-11-30 DIAGNOSIS — Z96611 Presence of right artificial shoulder joint: Secondary | ICD-10-CM | POA: Diagnosis not present

## 2015-11-30 DIAGNOSIS — Z471 Aftercare following joint replacement surgery: Secondary | ICD-10-CM | POA: Diagnosis not present

## 2015-12-01 ENCOUNTER — Ambulatory Visit: Payer: Medicare Other | Attending: Family | Admitting: Family

## 2015-12-01 ENCOUNTER — Encounter: Payer: Self-pay | Admitting: Family

## 2015-12-01 VITALS — BP 141/88 | HR 82 | Resp 18 | Ht 76.0 in | Wt 194.0 lb

## 2015-12-01 DIAGNOSIS — Z8701 Personal history of pneumonia (recurrent): Secondary | ICD-10-CM | POA: Insufficient documentation

## 2015-12-01 DIAGNOSIS — I10 Essential (primary) hypertension: Secondary | ICD-10-CM

## 2015-12-01 DIAGNOSIS — F17210 Nicotine dependence, cigarettes, uncomplicated: Secondary | ICD-10-CM | POA: Diagnosis not present

## 2015-12-01 DIAGNOSIS — Z8249 Family history of ischemic heart disease and other diseases of the circulatory system: Secondary | ICD-10-CM | POA: Diagnosis not present

## 2015-12-01 DIAGNOSIS — Z96611 Presence of right artificial shoulder joint: Secondary | ICD-10-CM | POA: Insufficient documentation

## 2015-12-01 DIAGNOSIS — I110 Hypertensive heart disease with heart failure: Secondary | ICD-10-CM | POA: Diagnosis not present

## 2015-12-01 DIAGNOSIS — Z96641 Presence of right artificial hip joint: Secondary | ICD-10-CM | POA: Diagnosis not present

## 2015-12-01 DIAGNOSIS — Z833 Family history of diabetes mellitus: Secondary | ICD-10-CM | POA: Insufficient documentation

## 2015-12-01 DIAGNOSIS — D6489 Other specified anemias: Secondary | ICD-10-CM | POA: Diagnosis not present

## 2015-12-01 DIAGNOSIS — E119 Type 2 diabetes mellitus without complications: Secondary | ICD-10-CM | POA: Insufficient documentation

## 2015-12-01 DIAGNOSIS — M75101 Unspecified rotator cuff tear or rupture of right shoulder, not specified as traumatic: Secondary | ICD-10-CM | POA: Insufficient documentation

## 2015-12-01 DIAGNOSIS — Z888 Allergy status to other drugs, medicaments and biological substances status: Secondary | ICD-10-CM | POA: Diagnosis not present

## 2015-12-01 DIAGNOSIS — Z823 Family history of stroke: Secondary | ICD-10-CM | POA: Diagnosis not present

## 2015-12-01 DIAGNOSIS — E785 Hyperlipidemia, unspecified: Secondary | ICD-10-CM | POA: Diagnosis not present

## 2015-12-01 DIAGNOSIS — M1990 Unspecified osteoarthritis, unspecified site: Secondary | ICD-10-CM | POA: Diagnosis not present

## 2015-12-01 DIAGNOSIS — M109 Gout, unspecified: Secondary | ICD-10-CM | POA: Diagnosis not present

## 2015-12-01 DIAGNOSIS — I5022 Chronic systolic (congestive) heart failure: Secondary | ICD-10-CM | POA: Insufficient documentation

## 2015-12-01 DIAGNOSIS — H5462 Unqualified visual loss, left eye, normal vision right eye: Secondary | ICD-10-CM | POA: Diagnosis not present

## 2015-12-01 DIAGNOSIS — I2510 Atherosclerotic heart disease of native coronary artery without angina pectoris: Secondary | ICD-10-CM | POA: Diagnosis not present

## 2015-12-01 DIAGNOSIS — F17200 Nicotine dependence, unspecified, uncomplicated: Secondary | ICD-10-CM

## 2015-12-01 MED ORDER — SACUBITRIL-VALSARTAN 24-26 MG PO TABS
1.0000 | ORAL_TABLET | Freq: Two times a day (BID) | ORAL | 5 refills | Status: DC
Start: 2015-12-01 — End: 2016-02-27

## 2015-12-01 MED ORDER — SPIRONOLACTONE 25 MG PO TABS: 12.5000 mg | ORAL_TABLET | Freq: Every day | ORAL | 3 refills | Status: DC

## 2015-12-01 NOTE — Progress Notes (Signed)
Subjective:    Patient ID: Travis [name], male    DOB: [birth year], 60 y.o.   MRN: [id number]  Congestive Heart Failure  Presents for follow-up visit. The disease course has been improving. Associated symptoms include fatigue. Pertinent negatives include no abdominal pain, chest pain, edema, orthopnea, palpitations or shortness of breath. The symptoms have been improving. Past treatments include beta blockers, angiotensin receptor blockers, aldosterone receptor blockers and salt and fluid restriction. The treatment provided significant relief. Compliance with prior treatments has been good. His past medical history is significant for anemia, CAD and HTN. He has multiple 1st degree relatives with heart disease.  Hypertension  This is a chronic problem. The current episode started more than 1 year ago. The problem is unchanged. The problem is controlled. Pertinent negatives include no chest pain, palpitations, peripheral edema or shortness of breath. Agents associated with hypertension include NSAIDs. Risk factors for coronary artery disease include male gender, smoking/tobacco exposure, family history and dyslipidemia. Past treatments include angiotensin blockers, beta blockers, diuretics and lifestyle changes. The current treatment provides moderate improvement. There are no compliance problems.  Hypertensive end-organ damage includes heart failure.   Past Medical History:  Diagnosis Date  . Alcohol abuse   . 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 Asthma   . 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 Chronic anemia   . Chronic systolic CHF (congestive heart failure) ([place])    a. EF 20-25% by echo 12/28/10; b improved to 45-50% on echo in June 2015  . 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 Coronary artery disease    Cardiac catheterization in November 2016 showed mild 30% proximal LAD stenosis with no evidence of obstructive disease.   . Diabetes mellitus, type 2 ([place])    pt reports his DM is gone  . GI bleed    15 years ago  . Gout   . Hemoptysis    secondary to pulmonary edema  . Hyperlipidemia   . Nonischemic cardiomyopathy ([place])    minimal coronary disease by cath 12/29/10  . Osteoarthritis   . Pneumonia   . Tobacco abuse   . Vision loss    peripherial vision only left eye.[facility] Ret[facility]  artery occusion     Past Surgical History:  Procedure Laterality Date  . CARDIAC CATHETERIZATION    . CARDIAC CATHETERIZATION N/A 01/28/2015   Procedure: Left Heart Cath and Coronary Angiography;  Surgeon: Wellington Hampshire, MD;  Location: [facility];  Service: Cardiovascular;  Laterality: N/A;  . COLONOSCOPY    . HIP ARTHROPLASTY Right 03/15/2013   Procedure: ARTHROPLASTY BIPOLAR HIP;  Surgeon: [name], MD;  Location: [facility];  Service: Orthopedics;  Laterality: Right;  . TOTAL SHOULDER ARTHROPLASTY Right 09/01/2015  . TOTAL SHOULDER ARTHROPLASTY Right 09/01/2015   Procedure: RIGHT TOTAL SHOULDER ARTHROPLASTY;  Surgeon: [name], MD;  Location: [facility];  Service: Orthopedics;  Laterality: Right;    Family History  Problem Relation Age of Onset  . Diabetes Mother   . Hypertension Mother   . Diabetes Father   . Hypertension Father   . Cancer Neg Hx   . Heart disease Neg Hx   . Stroke Neg Hx     Social History  Substance Use Topics  . Smoking status: Current Every Day Smoker    Packs/day: 1.00    Years: 30.00    Types: Cigarettes  . Smokeless tobacco: Never Used  . Alcohol use 0.6  oz/week    1 Glasses of wine per week     Comment: occasionally    Allergies  Allergen Reactions  . Tramadol Itching and Rash    Prior to Admission medications   Medication Sig Start Date End Date Taking? Authorizing Provider  albuterol (PROVENTIL HFA;VENTOLIN HFA) 108 (90 BASE) MCG/ACT inhaler Inhale 2 puffs into the lungs every 6 (six) hours as needed for wheezing or shortness of breath. Take 2 puffs  every 5-10 minutes up to 6 puffs total over 15 minutes when needed.  Use with a spacer. Patient taking differently: Inhale 2 puffs into the lungs every 6 (six) hours as needed for wheezing or shortness of breath. Take 2 puffs up to 6 puffs total over 15 minutes when needed.  Use with a spacer. 01/24/15  Yes Ahmed Prima, MD  allopurinol (ZYLOPRIM) 100 MG tablet Take 100 mg by mouth daily.   Yes Historical Provider, MD  aspirin EC 325 MG tablet Take 162.5 mg by mouth daily.   Yes Historical Provider, MD  carvedilol (COREG) 12.5 MG tablet Take 1 tablet (12.5 mg total) by mouth 2 (two) times daily with a meal. 10/20/15  Yes Alisa Graff, FNP  fenofibrate 54 MG tablet Take 1 tablet (54 mg total) by mouth daily. 08/02/15  Yes Amy D Clegg, NP  folic acid (FOLVITE) 1 MG tablet Take 1 mg by mouth daily.   Yes Historical Provider, MD  Multiple Vitamin (MULTIVITAMIN WITH MINERALS) TABS tablet Take 1 tablet by mouth daily. Men's One a Day   Yes Historical Provider, MD  naproxen (NAPROSYN) 500 MG tablet Take 500 mg by mouth 2 (two) times daily as needed for moderate pain (pain).    Yes Historical Provider, MD  Omega-3 Fatty Acids (FISH OIL PO) Take 1 capsule by mouth daily.   Yes Historical Provider, MD  sacubitril-valsartan (ENTRESTO) 24-26 MG Take 1 tablet by mouth 2 (two) times daily. 12/01/15  Yes Alisa Graff, FNP  spironolactone (ALDACTONE) 25 MG tablet Take 0.5 tablets (12.5 mg total) by mouth daily. 12/01/15  Yes Alisa Graff, FNP  tetrahydrozoline-zinc (VISINE-AC) 0.05-0.25 % ophthalmic solution Place 2 drops into both eyes daily as needed (for allergy eyes).   Yes Historical Provider, MD      Review of Systems  Constitutional: Positive for fatigue. Negative for appetite change.  HENT: Positive for congestion and hearing loss. Negative for sore throat.   Eyes: Negative.   Respiratory: Positive for cough (in the morning at times). Negative for chest tightness and shortness of breath.    Cardiovascular: Negative for chest pain, palpitations and leg swelling.  Gastrointestinal: Negative for abdominal distention and abdominal pain.  Endocrine: Negative.   Genitourinary: Negative.   Musculoskeletal: Positive for arthralgias (right shoulder). Negative for back pain.  Allergic/Immunologic: Negative.   Neurological: Negative for dizziness and light-headedness.  Hematological: Negative for adenopathy. Does not bruise/bleed easily.  Psychiatric/Behavioral: Negative for dysphoric mood and sleep disturbance (sleepingon 1 pillow). The patient is not nervous/anxious.        Objective:   Physical Exam  Constitutional: He is oriented to person, place, and time. He appears well-developed and well-nourished.  HENT:  Head: Normocephalic and atraumatic.  Eyes: Conjunctivae are normal. Pupils are equal, round, and reactive to light.  Neck: Normal range of motion. Neck supple.  Cardiovascular: Normal rate and regular rhythm.   Pulmonary/Chest: Effort normal. He has no wheezes. He has no rales.  Abdominal: Soft. He exhibits no distension. There is no  tenderness.  Musculoskeletal: He exhibits no edema or tenderness.  Neurological: He is alert and oriented to person, place, and time.  Skin: Skin is warm and dry.  Psychiatric: He has a normal mood and affect. His behavior is normal. Thought content normal.  Nursing note and vitals reviewed.   BP (!) 141/88   Pulse 82   Resp 18   Ht 6\' 4"  (1.93 m)   Wt 194 lb (88 kg)   SpO2 100%   BMI 23.61 kg/m        Assessment & Plan:  1: Chronic heart failure with reduced ejection fraction- Patient presents with fatigue with moderate exertion (Class II) that quickly improves with rest. Denies any shortness of breath or swelling in his legs. Continues to weigh himself and says that his weight has been stable. By our scale, he's gained 5 pounds since he was last here on 08/24/15. Reminded to call for an overnight weight gain of >2 pounds or a  weekly weight gain of >5 pounds. He is not adding any salt to his food and is trying to eat low sodium foods. 28 samples of Entresto 24/26mg  given to patient to take 2 tablets twice daily. Could consider increasing dosage at his next office visit. [facility] PharmD went in and reviewed medications with him. 2: HTN- Blood pressure looks good today. Continue medications at this time. 3: Right rotator cuff- He's continuing with physical therapy and says that it feels better. Did resume tramadol last night which he has taken before but this time, he developed a rash and itching so he's now stopped it. Advised him to call his PCP and let her know about this. 4: Tobacco use- Patient continues to smoke daily and doesn't have a desire to quit. Complete cessation was discussed for 3 minutes with him.  Medication bottles were reviewed.  Return in 3 months or sooner for any questions/problems before then.

## 2015-12-01 NOTE — Patient Instructions (Signed)
Continue weighing daily and call for an overnight weight gain of > 2 pounds or a weekly weight gain of >5 pounds.    Smoking Cessation Quitting smoking is important to your health and has many advantages. However, it is not always easy to quit since nicotine is a very addictive drug. Oftentimes, people try 3 times or more before being able to quit. This document explains the best ways for you to prepare to quit smoking. Quitting takes hard work and a lot of effort, but you can do it. ADVANTAGES OF QUITTING SMOKING  You will live longer, feel better, and live better.  Your body will feel the impact of quitting smoking almost immediately.  Within 20 minutes, blood pressure decreases. Your pulse returns to its normal level.  After 8 hours, carbon monoxide levels in the blood return to normal. Your oxygen level increases.  After 24 hours, the chance of having a heart attack starts to decrease. Your breath, hair, and body stop smelling like smoke.  After 48 hours, damaged nerve endings begin to recover. Your sense of taste and smell improve.  After 72 hours, the body is virtually free of nicotine. Your bronchial tubes relax and breathing becomes easier.  After 2 to 12 weeks, lungs can hold more air. Exercise becomes easier and circulation improves.  The risk of having a heart attack, stroke, cancer, or lung disease is greatly reduced.  After 1 year, the risk of coronary heart disease is cut in half.  After 5 years, the risk of stroke falls to the same as a nonsmoker.  After 10 years, the risk of lung cancer is cut in half and the risk of other cancers decreases significantly.  After 15 years, the risk of coronary heart disease drops, usually to the level of a nonsmoker.  If you are pregnant, quitting smoking will improve your chances of having a healthy baby.  The people you live with, especially any children, will be healthier.  You will have extra money to spend on things other  than cigarettes. QUESTIONS TO THINK ABOUT BEFORE ATTEMPTING TO QUIT You may want to talk about your answers with your health care provider.  Why do you want to quit?  If you tried to quit in the past, what helped and what did not?  What will be the most difficult situations for you after you quit? How will you plan to handle them?  Who can help you through the tough times? Your family? Friends? A health care provider?  What pleasures do you get from smoking? What ways can you still get pleasure if you quit? Here are some questions to ask your health care provider:  How can you help me to be successful at quitting?  What medicine do you think would be best for me and how should I take it?  What should I do if I need more help?  What is smoking withdrawal like? How can I get information on withdrawal? GET READY  Set a quit date.  Change your environment by getting rid of all cigarettes, ashtrays, matches, and lighters in your home, car, or work. Do not let people smoke in your home.  Review your past attempts to quit. Think about what worked and what did not. GET SUPPORT AND ENCOURAGEMENT You have a better chance of being successful if you have help. You can get support in many ways.  Tell your family, friends, and coworkers that you are going to quit and need their support. Ask   them not to smoke around you.  Get individual, group, or telephone counseling and support. Programs are available at local hospitals and health centers. Call your local health department for information about programs in your area.  Spiritual beliefs and practices may help some smokers quit.  Download a "quit meter" on your computer to keep track of quit statistics, such as how long you have gone without smoking, cigarettes not smoked, and money saved.  Get a self-help book about quitting smoking and staying off tobacco. LEARN NEW SKILLS AND BEHAVIORS  Distract yourself from urges to smoke. Talk to  someone, go for a walk, or occupy your time with a task.  Change your normal routine. Take a different route to work. Drink tea instead of coffee. Eat breakfast in a different place.  Reduce your stress. Take a hot bath, exercise, or read a book.  Plan something enjoyable to do every day. Reward yourself for not smoking.  Explore interactive web-based programs that specialize in helping you quit. GET MEDICINE AND USE IT CORRECTLY Medicines can help you stop smoking and decrease the urge to smoke. Combining medicine with the above behavioral methods and support can greatly increase your chances of successfully quitting smoking.  Nicotine replacement therapy helps deliver nicotine to your body without the negative effects and risks of smoking. Nicotine replacement therapy includes nicotine gum, lozenges, inhalers, nasal sprays, and skin patches. Some may be available over-the-counter and others require a prescription.  Antidepressant medicine helps people abstain from smoking, but how this works is unknown. This medicine is available by prescription.  Nicotinic receptor partial agonist medicine simulates the effect of nicotine in your brain. This medicine is available by prescription. Ask your health care provider for advice about which medicines to use and how to use them based on your health history. Your health care provider will tell you what side effects to look out for if you choose to be on a medicine or therapy. Carefully read the information on the package. Do not use any other product containing nicotine while using a nicotine replacement product.  RELAPSE OR DIFFICULT SITUATIONS Most relapses occur within the first 3 months after quitting. Do not be discouraged if you start smoking again. Remember, most people try several times before finally quitting. You may have symptoms of withdrawal because your body is used to nicotine. You may crave cigarettes, be irritable, feel very hungry, cough  often, get headaches, or have difficulty concentrating. The withdrawal symptoms are only temporary. They are strongest when you first quit, but they will go away within 10-14 days. To reduce the chances of relapse, try to:  Avoid drinking alcohol. Drinking lowers your chances of successfully quitting.  Reduce the amount of caffeine you consume. Once you quit smoking, the amount of caffeine in your body increases and can give you symptoms, such as a rapid heartbeat, sweating, and anxiety.  Avoid smokers because they can make you want to smoke.  Do not let weight gain distract you. Many smokers will gain weight when they quit, usually less than 10 pounds. Eat a healthy diet and stay active. You can always lose the weight gained after you quit.  Find ways to improve your mood other than smoking. FOR MORE INFORMATION  www.smokefree.gov  Document Released: 03/06/2001 Document Revised: 07/27/2013 Document Reviewed: 06/21/2011 ExitCare Patient Information 2015 [employer]. This information is not intended to replace advice given to you by your health care provider. Make sure you discuss any questions you have with your   health care provider.  

## 2015-12-07 DIAGNOSIS — M25511 Pain in right shoulder: Secondary | ICD-10-CM | POA: Diagnosis not present

## 2015-12-12 DIAGNOSIS — M25511 Pain in right shoulder: Secondary | ICD-10-CM | POA: Diagnosis not present

## 2015-12-14 DIAGNOSIS — M25511 Pain in right shoulder: Secondary | ICD-10-CM | POA: Diagnosis not present

## 2015-12-19 DIAGNOSIS — M25511 Pain in right shoulder: Secondary | ICD-10-CM | POA: Diagnosis not present

## 2015-12-21 DIAGNOSIS — M1711 Unilateral primary osteoarthritis, right knee: Secondary | ICD-10-CM | POA: Diagnosis not present

## 2015-12-22 DIAGNOSIS — M25511 Pain in right shoulder: Secondary | ICD-10-CM | POA: Diagnosis not present

## 2016-01-17 ENCOUNTER — Ambulatory Visit (INDEPENDENT_AMBULATORY_CARE_PROVIDER_SITE_OTHER): Payer: Medicare Other

## 2016-01-17 DIAGNOSIS — Z23 Encounter for immunization: Secondary | ICD-10-CM | POA: Diagnosis not present

## 2016-02-14 ENCOUNTER — Telehealth: Payer: Self-pay | Admitting: Internal Medicine

## 2016-02-14 NOTE — Telephone Encounter (Signed)
LVM for pt to call back and schedule AWV/OV 30 with PCP.

## 2016-02-27 ENCOUNTER — Ambulatory Visit: Payer: Medicare Other | Attending: Family | Admitting: Family

## 2016-02-27 ENCOUNTER — Encounter: Payer: Self-pay | Admitting: Family

## 2016-02-27 VITALS — BP 142/96 | HR 82 | Resp 18 | Ht 76.0 in | Wt 186.0 lb

## 2016-02-27 DIAGNOSIS — Z7982 Long term (current) use of aspirin: Secondary | ICD-10-CM | POA: Insufficient documentation

## 2016-02-27 DIAGNOSIS — I509 Heart failure, unspecified: Secondary | ICD-10-CM | POA: Diagnosis present

## 2016-02-27 DIAGNOSIS — M1990 Unspecified osteoarthritis, unspecified site: Secondary | ICD-10-CM | POA: Insufficient documentation

## 2016-02-27 DIAGNOSIS — Z79899 Other long term (current) drug therapy: Secondary | ICD-10-CM | POA: Diagnosis not present

## 2016-02-27 DIAGNOSIS — F17200 Nicotine dependence, unspecified, uncomplicated: Secondary | ICD-10-CM

## 2016-02-27 DIAGNOSIS — Z833 Family history of diabetes mellitus: Secondary | ICD-10-CM | POA: Insufficient documentation

## 2016-02-27 DIAGNOSIS — J45909 Unspecified asthma, uncomplicated: Secondary | ICD-10-CM | POA: Diagnosis not present

## 2016-02-27 DIAGNOSIS — E119 Type 2 diabetes mellitus without complications: Secondary | ICD-10-CM | POA: Insufficient documentation

## 2016-02-27 DIAGNOSIS — Z8249 Family history of ischemic heart disease and other diseases of the circulatory system: Secondary | ICD-10-CM | POA: Diagnosis not present

## 2016-02-27 DIAGNOSIS — F17210 Nicotine dependence, cigarettes, uncomplicated: Secondary | ICD-10-CM | POA: Insufficient documentation

## 2016-02-27 DIAGNOSIS — E785 Hyperlipidemia, unspecified: Secondary | ICD-10-CM | POA: Diagnosis not present

## 2016-02-27 DIAGNOSIS — I110 Hypertensive heart disease with heart failure: Secondary | ICD-10-CM | POA: Diagnosis not present

## 2016-02-27 DIAGNOSIS — Z888 Allergy status to other drugs, medicaments and biological substances status: Secondary | ICD-10-CM | POA: Diagnosis not present

## 2016-02-27 DIAGNOSIS — I428 Other cardiomyopathies: Secondary | ICD-10-CM | POA: Insufficient documentation

## 2016-02-27 DIAGNOSIS — I2510 Atherosclerotic heart disease of native coronary artery without angina pectoris: Secondary | ICD-10-CM | POA: Insufficient documentation

## 2016-02-27 DIAGNOSIS — I10 Essential (primary) hypertension: Secondary | ICD-10-CM

## 2016-02-27 DIAGNOSIS — I5022 Chronic systolic (congestive) heart failure: Secondary | ICD-10-CM

## 2016-02-27 MED ORDER — SACUBITRIL-VALSARTAN 49-51 MG PO TABS: 1.0000 | ORAL_TABLET | Freq: Two times a day (BID) | ORAL | 5 refills | Status: DC

## 2016-02-27 NOTE — Progress Notes (Signed)
Patient ID: Travis [name], male    DOB: May 31, 1955, 60 y.o.   MRN: [id number]  HPI  Travis [name] is a 60 y/o male with a history of osteoarthritis, hyperlipidemia, gout, HTN, GI bleed, DM, CAD, anemia, asthma, previous alcohol use, current tobacco use and chronic heart failure.  Last echo was done 01/26/15 and showed an EF of 25-30% along with trivial Travis/ TR. EF has dropped from 2015 when it was 45-50%. Had a cardiac catheterization done 01/28/15 which showed 10% stenosis in Prox RCA to Mid RCA and 30% stenosis in Prox LAD to Mid LAD which is not much different from previous angiography. Cardiomyopathy felt to be due to previous alcohol use.   Was last in the ED on 09/06/15 for constipation after surgery. Was given an enema with good results and he was discharged home. Was last admitted on 09/01/15 for right shoulder shoulder and was discharged the following day. Last HF admission was November 2016.  He presents today for a follow-up visit with a mild amount of fatigue and shortness of breath upon exertion. Denies any swelling in his legs/abdomen although does report feeling full easily. Has not been weighing himself daily. Continues to smoke.   Past Medical History:  Diagnosis Date  . Alcohol abuse   . 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 Asthma   . 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 Chronic anemia   . Chronic systolic CHF (congestive heart failure) ([place])    a. EF 20-25% by echo 12/28/10; b improved to 45-50% on echo in June 2015  . 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 Coronary artery disease    Cardiac catheterization in November 2016 showed mild 30% proximal LAD stenosis with no evidence of obstructive disease.  . Diabetes mellitus, type 2 ([place])    pt reports his DM is gone  . GI bleed    15 years ago  . Gout   . Hemoptysis    secondary to pulmonary edema  . Hyperlipidemia   . Nonischemic cardiomyopathy ([place])    minimal coronary disease by cath 12/29/10  . Osteoarthritis   . Pneumonia   . Tobacco abuse   . Vision loss    peripherial vision only left eye.[facility] Retin[facility]  artery occusion     Past Surgical History:  Procedure Laterality Date  . CARDIAC CATHETERIZATION    . CARDIAC CATHETERIZATION N/A 01/28/2015   Procedure: Left Heart Cath and Coronary Angiography;  Surgeon: Wellington Hampshire, MD;  Location: [facility];  Service: Cardiovascular;  Laterality: N/A;  . COLONOSCOPY    . HIP ARTHROPLASTY Right 03/15/2013   Procedure: ARTHROPLASTY BIPOLAR HIP;  Surgeon: [name], MD;  Location: [facility];  Service: Orthopedics;  Laterality: Right;  . TOTAL SHOULDER ARTHROPLASTY Right 09/01/2015  . TOTAL SHOULDER ARTHROPLASTY Right 09/01/2015   Procedure: RIGHT TOTAL SHOULDER ARTHROPLASTY;  Surgeon: [name], MD;  Location: [facility];  Service: Orthopedics;  Laterality: Right;    Family History  Problem Relation Age of Onset  . Diabetes Mother   . Hypertension Mother   . Diabetes Father   . Hypertension Father   . Cancer Neg Hx   . Heart disease Neg Hx   . Stroke Neg Hx     Social History  Substance Use Topics  . Smoking status: Current Every Day Smoker    Packs/day: 1.00    Years: 30.00    Types: Cigarettes  .  Smokeless tobacco: Never Used  . Alcohol use 0.6 oz/week    1 Glasses of wine per week     Comment: occasionally   Allergies  Allergen Reactions  . Tramadol Itching and Rash    Prior to Admission medications   Medication Sig Start Date End Date Taking? Authorizing Provider  albuterol (PROVENTIL HFA;VENTOLIN HFA) 108 (90 BASE) MCG/ACT inhaler Inhale 2 puffs into the lungs every 6 (six) hours as needed for wheezing or shortness of breath. Take 2 puffs every 5-10 minutes up to 6 puffs total over 15 minutes when needed.  Use with a spacer.  01/24/15  Yes Ahmed Prima, MD  allopurinol (ZYLOPRIM) 100 MG tablet Take 100 mg by mouth daily.   Yes Historical  Provider, MD  aspirin EC 325 MG tablet Take 162.5 mg by mouth daily.   Yes Historical Provider, MD  carvedilol (COREG) 12.5 MG tablet Take 1 tablet (12.5 mg total) by mouth 2 (two) times daily with a meal. 10/20/15  Yes Alisa Graff, FNP  fenofibrate 54 MG tablet Take 1 tablet (54 mg total) by mouth daily. 08/02/15  Yes Amy D Clegg, NP  folic acid (FOLVITE) 1 MG tablet Take 1 mg by mouth daily.   Yes Historical Provider, MD  Multiple Vitamin (MULTIVITAMIN WITH MINERALS) TABS tablet Take 1 tablet by mouth daily. Men's One a Day   Yes Historical Provider, MD  naproxen (NAPROSYN) 500 MG tablet Take 500 mg by mouth 2 (two) times daily as needed for moderate pain (pain).    Yes Historical Provider, MD  Omega-3 Fatty Acids (FISH OIL PO) Take 1 capsule by mouth daily.   Yes Historical Provider, MD  sacubitril-valsartan (ENTRESTO) 24-26 MG Take 1 tablet by mouth 2 (two) times daily. 12/01/15  Yes Alisa Graff, FNP  spironolactone (ALDACTONE) 25 MG tablet Take 0.5 tablets (12.5 mg total) by mouth daily. 12/01/15  Yes Alisa Graff, FNP    Review of Systems  Constitutional: Positive for appetite change (get full easy) and fatigue.  HENT: Positive for rhinorrhea. Negative for congestion and sore throat.   Eyes: Negative.   Respiratory: Positive for shortness of breath (minimal). Negative for cough and chest tightness.   Cardiovascular: Negative for chest pain, palpitations and leg swelling.  Gastrointestinal: Negative for abdominal distention and abdominal pain.  Endocrine: Negative.   Genitourinary: Negative.   Musculoskeletal: Positive for arthralgias (right shoulder pain at times) and back pain.  Skin: Negative.   Allergic/Immunologic: Negative.   Neurological: Negative for dizziness and light-headedness.  Hematological: Negative for adenopathy. Does not bruise/bleed easily.  Psychiatric/Behavioral: Negative for dysphoric mood and sleep disturbance (sleeping on 2 pillows). The patient is not  nervous/anxious.    Vitals:   02/27/16 1007 02/27/16 1028  BP: (!) 173/102 (!) 142/96  Pulse: 82   Resp: 18   SpO2: 99%   Weight: 186 lb (84.4 kg)   Height: 6\' 4"  (1.93 m)    Wt Readings from Last 3 Encounters:  02/27/16 186 lb (84.4 kg)  12/01/15 194 lb (88 kg)  09/29/15 187 lb (84.8 kg)   Lab Results  Component Value Date   CREATININE 1.21 09/06/2015   CREATININE 1.30 (H) 09/02/2015   CREATININE 1.30 (H) 08/29/2015    Physical Exam  Constitutional: He is oriented to person, place, and time. He appears well-developed and well-nourished.  HENT:  Head: Normocephalic and atraumatic.  Eyes: Conjunctivae are normal. Pupils are equal, round, and reactive to light.  Neck: Normal range of  motion. Neck supple. No JVD present.  Cardiovascular: Normal rate and regular rhythm.   Pulmonary/Chest: Effort normal. He has no wheezes. He has no rales.  Abdominal: Soft. He exhibits no distension. There is no tenderness.  Musculoskeletal: He exhibits no edema or tenderness.  Neurological: He is alert and oriented to person, place, and time.  Skin: Skin is warm and dry.  Psychiatric: He has a normal mood and affect. His behavior is normal. Thought content normal.  Nursing note and vitals reviewed.   Assessment & Plan:  1: Chronic heart failure with reduced ejection fraction- - NYHA class II - euvolemic - resume weighing daily and call for an overnight weight gain of >2 pounds/weekly weight gain of >5 pounds - adding "a little" salt to his food and he was encouraged to not add any salt - will increase his entresto to 49/51mg  twice daily. He's to finish out his current 24/26mg  dose (about 2 weeks left) and then begin the 49/51mg  dose. 28 samples of entresto 49/51 given, lot # [id number], exp date 10/18 - PharmD went in and reviewed medicationa - saw cardiologist Fletcher Anon) July 2017 and returns to him in 6 months. Needs to get echo repeated as last one was done November 2016. - received his flu  vaccine for the season  2: HTN- - BP mildly high even on recheck - increasing entresto per above - he says that he hasn't missed any of his medications  3: Tobacco use- - continues to smoke about 1 ppd of cigarettes and said he smoked right before coming to the office - reports never quitting before and doesn't desire to quit at this time - complete cessation discussed for 3 minutes with him  Return here in 6 weeks or sooner for any questions/problems before then.

## 2016-02-27 NOTE — Patient Instructions (Addendum)
Finish out [employer] 24/26mg  twice daily and then begin the 49/51mg  dose as twice daily.   New prescription for the 49/51mg  dosage sent to pharmacy     Resume weighing daily and call for an overnight weight gain of > 2 pounds or a weekly weight gain of >5 pounds.   Smoking Cessation Quitting smoking is important to your health and has many advantages. However, it is not always easy to quit since nicotine is a very addictive drug. Oftentimes, people try 3 times or more before being able to quit. This document explains the best ways for you to prepare to quit smoking. Quitting takes hard work and a lot of effort, but you can do it. ADVANTAGES OF QUITTING SMOKING  You will live longer, feel better, and live better.  Your body will feel the impact of quitting smoking almost immediately.  Within 20 minutes, blood pressure decreases. Your pulse returns to its normal level.  After 8 hours, carbon monoxide levels in the blood return to normal. Your oxygen level increases.  After 24 hours, the chance of having a heart attack starts to decrease. Your breath, hair, and body stop smelling like smoke.  After 48 hours, damaged nerve endings begin to recover. Your sense of taste and smell improve.  After 72 hours, the body is virtually free of nicotine. Your bronchial tubes relax and breathing becomes easier.  After 2 to 12 weeks, lungs can hold more air. Exercise becomes easier and circulation improves.  The risk of having a heart attack, stroke, cancer, or lung disease is greatly reduced.  After 1 year, the risk of coronary heart disease is cut in half.  After 5 years, the risk of stroke falls to the same as a nonsmoker.  After 10 years, the risk of lung cancer is cut in half and the risk of other cancers decreases significantly.  After 15 years, the risk of coronary heart disease drops, usually to the level of a nonsmoker.  If you are pregnant, quitting smoking will improve your chances of  having a healthy baby.  The people you live with, especially any children, will be healthier.  You will have extra money to spend on things other than cigarettes. QUESTIONS TO THINK ABOUT BEFORE ATTEMPTING TO QUIT You may want to talk about your answers with your health care provider.  Why do you want to quit?  If you tried to quit in the past, what helped and what did not?  What will be the most difficult situations for you after you quit? How will you plan to handle them?  Who can help you through the tough times? Your family? Friends? A health care provider?  What pleasures do you get from smoking? What ways can you still get pleasure if you quit? Here are some questions to ask your health care provider:  How can you help me to be successful at quitting?  What medicine do you think would be best for me and how should I take it?  What should I do if I need more help?  What is smoking withdrawal like? How can I get information on withdrawal? GET READY  Set a quit date.  Change your environment by getting rid of all cigarettes, ashtrays, matches, and lighters in your home, car, or work. Do not let people smoke in your home.  Review your past attempts to quit. Think about what worked and what did not. GET SUPPORT AND ENCOURAGEMENT You have a better chance of being successful  if you have help. You can get support in many ways.  Tell your family, friends, and coworkers that you are going to quit and need their support. Ask them not to smoke around you.  Get individual, group, or telephone counseling and support. Programs are available at [employer] and health centers. Call your local health department for information about programs in your area.  Spiritual beliefs and practices may help some smokers quit.  Download a "quit meter" on your computer to keep track of quit statistics, such as how long you have gone without smoking, cigarettes not smoked, and money saved.  Get  a self-help book about quitting smoking and staying off tobacco. Adrian yourself from urges to smoke. Talk to someone, go for a walk, or occupy your time with a task.  Change your normal routine. Take a different route to work. Drink tea instead of coffee. Eat breakfast in a different place.  Reduce your stress. Take a hot bath, exercise, or read a book.  Plan something enjoyable to do every day. Reward yourself for not smoking.  Explore interactive web-based programs that specialize in helping you quit. GET MEDICINE AND USE IT CORRECTLY Medicines can help you stop smoking and decrease the urge to smoke. Combining medicine with the above behavioral methods and support can greatly increase your chances of successfully quitting smoking.  Nicotine replacement therapy helps deliver nicotine to your body without the negative effects and risks of smoking. Nicotine replacement therapy includes nicotine gum, lozenges, inhalers, nasal sprays, and skin patches. Some may be available over-the-counter and others require a prescription.  Antidepressant medicine helps people abstain from smoking, but how this works is unknown. This medicine is available by prescription.  Nicotinic receptor partial agonist medicine simulates the effect of nicotine in your brain. This medicine is available by prescription. Ask your health care provider for advice about which medicines to use and how to use them based on your health history. Your health care provider will tell you what side effects to look out for if you choose to be on a medicine or therapy. Carefully read the information on the package. Do not use any other product containing nicotine while using a nicotine replacement product.  RELAPSE OR DIFFICULT SITUATIONS Most relapses occur within the first 3 months after quitting. Do not be discouraged if you start smoking again. Remember, most people try several times before finally  quitting. You may have symptoms of withdrawal because your body is used to nicotine. You may crave cigarettes, be irritable, feel very hungry, cough often, get headaches, or have difficulty concentrating. The withdrawal symptoms are only temporary. They are strongest when you first quit, but they will go away within 10-14 days. To reduce the chances of relapse, try to:  Avoid drinking alcohol. Drinking lowers your chances of successfully quitting.  Reduce the amount of caffeine you consume. Once you quit smoking, the amount of caffeine in your body increases and can give you symptoms, such as a rapid heartbeat, sweating, and anxiety.  Avoid smokers because they can make you want to smoke.  Do not let weight gain distract you. Many smokers will gain weight when they quit, usually less than 10 pounds. Eat a healthy diet and stay active. You can always lose the weight gained after you quit.  Find ways to improve your mood other than smoking. FOR MORE INFORMATION  www.smokefree.gov  Document Released: 03/06/2001 Document Revised: 07/27/2013 Document Reviewed: 06/21/2011 [employer] Patient Information  2015 [employer]. This information is not intended to replace advice given to you by your health care provider. Make sure you discuss any questions you have with your health care provider.  

## 2016-03-29 ENCOUNTER — Encounter: Payer: Self-pay | Admitting: Emergency Medicine

## 2016-03-29 ENCOUNTER — Emergency Department
Admission: EM | Admit: 2016-03-29 | Discharge: 2016-03-29 | Disposition: A | Discharge status: Home / Self Care | Payer: Medicare Other | Attending: Emergency Medicine | Admitting: Emergency Medicine

## 2016-03-29 DIAGNOSIS — E119 Type 2 diabetes mellitus without complications: Secondary | ICD-10-CM | POA: Insufficient documentation

## 2016-03-29 DIAGNOSIS — F17210 Nicotine dependence, cigarettes, uncomplicated: Secondary | ICD-10-CM | POA: Diagnosis not present

## 2016-03-29 DIAGNOSIS — J0110 Acute frontal sinusitis, unspecified: Secondary | ICD-10-CM | POA: Diagnosis not present

## 2016-03-29 DIAGNOSIS — J45909 Unspecified asthma, uncomplicated: Secondary | ICD-10-CM | POA: Insufficient documentation

## 2016-03-29 DIAGNOSIS — I110 Hypertensive heart disease with heart failure: Secondary | ICD-10-CM | POA: Diagnosis not present

## 2016-03-29 DIAGNOSIS — R0981 Nasal congestion: Secondary | ICD-10-CM | POA: Diagnosis present

## 2016-03-29 DIAGNOSIS — I5032 Chronic diastolic (congestive) heart failure: Secondary | ICD-10-CM | POA: Diagnosis not present

## 2016-03-29 DIAGNOSIS — I2510 Atherosclerotic heart disease of native coronary artery without angina pectoris: Secondary | ICD-10-CM | POA: Diagnosis not present

## 2016-03-29 DIAGNOSIS — Z7982 Long term (current) use of aspirin: Secondary | ICD-10-CM | POA: Insufficient documentation

## 2016-03-29 DIAGNOSIS — Z79899 Other long term (current) drug therapy: Secondary | ICD-10-CM | POA: Insufficient documentation

## 2016-03-29 MED ORDER — AMOXICILLIN-POT CLAVULANATE 875-125 MG PO TABS: 1.0000 | ORAL_TABLET | Freq: Two times a day (BID) | ORAL | 0 refills | Status: DC

## 2016-03-29 MED ORDER — PROMETHAZINE HCL 25 MG/ML IJ SOLN
25.0000 mg | Freq: Once | INTRAMUSCULAR | Status: AC
  Administered 2016-03-29: 25 mg via INTRAMUSCULAR
  Filled 2016-03-29: qty 1

## 2016-03-29 MED ORDER — ALBUTEROL SULFATE (2.5 MG/3ML) 0.083% IN NEBU: 2.5000 mg | INHALATION_SOLUTION | Freq: Once | RESPIRATORY_TRACT | Status: DC

## 2016-03-29 MED ORDER — FLUTICASONE PROPIONATE 50 MCG/ACT NA SUSP: 1.0000 | Freq: Two times a day (BID) | NASAL | 0 refills | Status: DC

## 2016-03-29 MED ORDER — KETOROLAC TROMETHAMINE 30 MG/ML IJ SOLN
30.0000 mg | Freq: Once | INTRAMUSCULAR | Status: AC
  Administered 2016-03-29: 30 mg via INTRAMUSCULAR
  Filled 2016-03-29: qty 1

## 2016-03-29 MED ORDER — LIDOCAINE HCL (PF) 1 % IJ SOLN: 10.0000 mL | Freq: Once | INTRAMUSCULAR | Status: DC

## 2016-03-29 MED ORDER — CETIRIZINE HCL 10 MG PO TABS: 10.0000 mg | ORAL_TABLET | Freq: Every day | ORAL | 0 refills | Status: DC

## 2016-03-29 MED ORDER — DIPHENHYDRAMINE HCL 50 MG/ML IJ SOLN
50.0000 mg | Freq: Once | INTRAMUSCULAR | Status: AC
  Administered 2016-03-29: 50 mg via INTRAMUSCULAR
  Filled 2016-03-29: qty 1

## 2016-03-29 NOTE — ED Triage Notes (Signed)
Pt. States congestion, throbbing posterior headache, fever and chills x3 days. Denies exposure to same. No home tx PTA for sx.

## 2016-03-29 NOTE — ED Provider Notes (Signed)
[facility] Emergency Department Provider Note  ____________________________________________  Time seen: Approximately 9:34 PM  I have reviewed the triage vital signs and the nursing notes.   HISTORY  Chief Complaint Headache; Cough; and Nasal Congestion    HPI Travis [name] is a 61 y.o. male who presents to emergency department complaining of nasal congestion, sinus pressure, frontal and posterior right-sided headache, fever and chills 3 days. Patient reports that sinus congestion has been ongoing for one in the past 3 days but intensified in the last 3 days. Patient reports that he feels "plugged up" in the right sinus cavities. Patient reports that headache began gradually and has increased in nature but is not unbearable. Patient denies any visual changes, numbness or tingling. Patient has tried to decongestants today. He reports after taking both of the same time he had some palpitations. Patient reports that he has a significant cardiac history but states that he is not having chest pain, shortness of breath or any other symptoms consistent with his heart conditions. Patient states that after approximately an hour, palpitations have completely resolved and not returned. Patient denies any cough, shortness of breath, abdominal pain, nausea or vomiting.   Past Medical History:  Diagnosis Date  . Alcohol abuse   . 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 Asthma   . 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 Chronic anemia   . Chronic systolic CHF (congestive heart failure) ([place])    a. EF 20-25% by echo 12/28/10; b improved to 45-50% on echo in June 2015  . 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 Coronary artery disease    Cardiac catheterization in November 2016 showed mild 30% proximal LAD stenosis with no evidence of obstructive disease.  . Diabetes mellitus, type 2 ([place])    pt reports his DM is  gone  . GI bleed    15 years ago  . Gout   . Hemoptysis    secondary to pulmonary edema  . Hyperlipidemia   . Hypertension   . Nonischemic cardiomyopathy ([place])    minimal coronary disease by cath 12/29/10  . Osteoarthritis   . Pneumonia   . Tobacco abuse   . Vision loss    peripherial vision only left eye.[place]  artery occusion     Patient Active Problem List   Diagnosis Date Noted  . S/P shoulder replacement 09/01/2015  . Rotator cuff syndrome of right shoulder 08/24/2015  . Essential hypertension 04/18/2015  . 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 Literacy level of illiterate 06/04/2014  . 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 Cardiomyopathy, nonischemic ([place]) 03/15/2013  . Smoker 03/14/2013  . Alcoholism in remission ([place]) 03/14/2013  . Chronic systolic heart failure ([place]) 01/05/2011  . DM2 (diabetes mellitus, type 2) ([place]) 11/30/2008  . HLD (hyperlipidemia) 11/30/2008  . Gout 11/30/2008  . Iron deficiency anemia, unspecified 11/30/2008    Past Surgical History:  Procedure Laterality Date  . CARDIAC CATHETERIZATION    . CARDIAC CATHETERIZATION N/A 01/28/2015   Procedure: Left Heart Cath and Coronary Angiography;  Surgeon: Wellington Hampshire, MD;  Location: [place] [facility];  Service: Cardiovascular;  Laterality: N/A;  . COLONOSCOPY    . HIP ARTHROPLASTY Right 03/15/2013   Procedure: ARTHROPLASTY BIPOLAR HIP;  Surgeon: [name], MD;  Location: [place];  Service: Orthopedics;  Laterality: Right;  . TOTAL SHOULDER ARTHROPLASTY Right 09/01/2015  . TOTAL SHOULDER ARTHROPLASTY Right  09/01/2015   Procedure: RIGHT TOTAL SHOULDER ARTHROPLASTY;  Surgeon: [name], MD;  Location: [place];  Service: Orthopedics;  Laterality: Right;    Prior to Admission medications   Medication Sig Start Date End Date Taking? Authorizing Provider  albuterol (PROVENTIL HFA;VENTOLIN HFA) 108 (90 BASE) MCG/ACT inhaler Inhale 2 puffs into the  lungs every 6 (six) hours as needed for wheezing or shortness of breath. Take 2 puffs every 5-10 minutes up to 6 puffs total over 15 minutes when needed.  Use with a spacer. Patient taking differently: Inhale 2 puffs into the lungs every 6 (six) hours as needed for wheezing or shortness of breath. Take 2 puffs up to 6 puffs total over 15 minutes when needed.  Use with a spacer. 01/24/15   Ahmed Prima, MD  allopurinol (ZYLOPRIM) 100 MG tablet Take 100 mg by mouth daily.    Historical Provider, MD  amoxicillin-clavulanate (AUGMENTIN) 875-125 MG tablet Take 1 tablet by mouth 2 (two) times daily. 03/29/16   Charline Bills Charita Lindenberger, PA-C  aspirin EC 325 MG tablet Take 162.5 mg by mouth daily.    Historical Provider, MD  carvedilol (COREG) 12.5 MG tablet Take 1 tablet (12.5 mg total) by mouth 2 (two) times daily with a meal. 10/20/15   Alisa Graff, FNP  cetirizine (ZYRTEC) 10 MG tablet Take 1 tablet (10 mg total) by mouth daily. 03/29/16   Charline Bills Kenni Newton, PA-C  fenofibrate 54 MG tablet Take 1 tablet (54 mg total) by mouth daily. 08/02/15   Amy D Clegg, NP  fluticasone (FLONASE) 50 MCG/ACT nasal spray Place 1 spray into both nostrils 2 (two) times daily. 03/29/16   Charline Bills Parthenia Tellefsen, PA-C  folic acid (FOLVITE) 1 MG tablet Take 1 mg by mouth daily.    Historical Provider, MD  Multiple Vitamin (MULTIVITAMIN WITH MINERALS) TABS tablet Take 1 tablet by mouth daily. Men's One a Day    Historical Provider, MD  naproxen (NAPROSYN) 500 MG tablet Take 500 mg by mouth 2 (two) times daily as needed for moderate pain (pain).     Historical Provider, MD  Omega-3 Fatty Acids (FISH OIL PO) Take 1 capsule by mouth daily.    Historical Provider, MD  sacubitril-valsartan (ENTRESTO) 49-51 MG Take 1 tablet by mouth 2 (two) times daily. 02/27/16   Alisa Graff, FNP  spironolactone (ALDACTONE) 25 MG tablet Take 0.5 tablets (12.5 mg total) by mouth daily. 12/01/15   Alisa Graff, FNP    Allergies Tramadol  Family  History  Problem Relation Age of Onset  . Diabetes Mother   . Hypertension Mother   . Diabetes Father   . Hypertension Father   . Cancer Neg Hx   . Heart disease Neg Hx   . Stroke Neg Hx     Social History Social History  Substance Use Topics  . Smoking status: Current Every Day Smoker    Packs/day: 1.00    Years: 30.00    Types: Cigarettes  . Smokeless tobacco: Never Used  . Alcohol use 0.6 oz/week    1 Glasses of wine per week     Comment: occasionally     Review of Systems  Constitutional: No fever/chills Eyes: No visual changes. No discharge ENT: Positive for nasal congestion and significant right-sided sinus congestion. Cardiovascular: no chest pain. Positive for approximately an hour palpitations after using 2 decongestants containing pseudoephedrine Respiratory: no cough. No SOB. Gastrointestinal: No abdominal pain.  No nausea, no vomiting.  No diarrhea.  No  constipation. Musculoskeletal: Negative for musculoskeletal pain. Skin: Negative for rash, abrasions, lacerations, ecchymosis. Neurological: Positive for right-sided headache denies focal weakness or numbness. 10-point ROS otherwise negative.  ____________________________________________   PHYSICAL EXAM:  VITAL SIGNS: ED Triage Vitals  Enc Vitals Group     BP 03/29/16 1941 (!) 154/103     Pulse Rate 03/29/16 1941 87     Resp 03/29/16 1941 18     Temp 03/29/16 1941 98.4 F (36.9 C)     Temp Source 03/29/16 1941 Oral     SpO2 03/29/16 1941 98 %     Weight 03/29/16 1934 185 lb (83.9 kg)     Height 03/29/16 1934 6\' 4"  (1.93 m)     Head Circumference --      Peak Flow --      Pain Score 03/29/16 1935 9     Pain Loc --      Pain Edu? --      Excl. in [place]? --      Constitutional: Alert and oriented. Well appearing and in no acute distress. Eyes: Conjunctivae are normal. PERRL. EOMI. Head: Atraumatic. ENT:      Ears: EACs and TMs unremarkable bilaterally.      Nose: Moderate to significant  congestion/rhinnorhea. Turbinates very boggy, edematous. Patient is very tender percussion over the right-sided frontal sinuses and mildly tender to percussion over left sided frontal sinuses.      Mouth/Throat: Mucous membranes are moist.  Neck: No stridor. Neck is supple for range of motion Hematological/Lymphatic/Immunilogical: No cervical lymphadenopathy. Cardiovascular: Normal rate, regular rhythm. Normal S1 and S2.  Good peripheral circulation. Respiratory: Normal respiratory effort without tachypnea or retractions. Lungs CTAB. Good air entry to the bases with no decreased or absent breath sounds. Musculoskeletal: Full range of motion to all extremities. No gross deformities appreciated. Neurologic:  Normal speech and language. No gross focal neurologic deficits are appreciated. Cranial nerves II through XII grossly intact Skin:  Skin is warm, dry and intact. No rash noted. Psychiatric: Mood and affect are normal. Speech and behavior are normal. Patient exhibits appropriate insight and judgement.   ____________________________________________   LABS (all labs ordered are listed, but only abnormal results are displayed)  Labs Reviewed - No data to display ____________________________________________  EKG  Normal sinus rhythm at a rate of 83 bpm. No significant ST elevations or depressions noted. Patient has inverted P waves in V1 consistent with left internal enlargement. Findings in V1 and V6 are consistent with LVH. Patient does have scattered T wave changes and inversion in lead 2, 3, aVF. PR and QRS intervals within normal limits. Slightly elongated QT segment. Abnormal EKG with no signs of STEMI at this time. ____________________________________________  RADIOLOGY   No results found.  ____________________________________________    PROCEDURES  Procedure(s) performed:    Procedures    Medications  ketorolac (TORADOL) 30 MG/ML injection 30 mg (30 mg Intramuscular  Given 03/29/16 2216)  diphenhydrAMINE (BENADRYL) injection 50 mg (50 mg Intramuscular Given 03/29/16 2216)  promethazine (PHENERGAN) injection 25 mg (25 mg Intramuscular Given 03/29/16 2216)     ____________________________________________   INITIAL IMPRESSION / ASSESSMENT AND PLAN / ED COURSE  Pertinent labs & imaging results that were available during my care of the patient were reviewed by me and considered in my medical decision making (see chart for details).  Review of the [place] CSRS was performed in accordance of the [place] prior to dispensing any controlled drugs.  Clinical Course     Patient's diagnosis  is consistent with Frontal sinusitis. Patient has had URI symptoms for 7-10 days. He is developed symptoms consistent with bacterial sinusitis over the last 3 days. Patient has a sinus headache, sinus pressure, nasal congestion 3 days. Patient did take 2 different medications containing pseudoephedrine and had palpitations. EKG was obtained even though patient was currently not experiencing any symptoms. Patient does have diffuse EKG changes but no concerning findings at this time for STEMI. Patient is currently symptom-free and no further workup at this time. Patient's symptoms were most likely contributed to use of 2 products containing some pseudoephedrine with known cardiac history. Patient was given migraine cocktail which did improve headache symptoms. On discharge, patient's blood pressure was elevated though close when compared with initial vital signs. Patient reports that he does have hypertension and does take blood pressure medication daily which he took this morning. Patient states that he really does not like needles and blood pressure was taken after his injections. Elevated blood pressure is likely secondary to infection and stress from injections as well as underlying history of hypertension. Patient will be discharged home with prescriptions for antibiotics, Flonase, Zyrtec.  Patient is to follow up with primary care as needed or otherwise directed. Patient is given ED precautions to return to the ED for any worsening or new symptoms.     ____________________________________________  FINAL CLINICAL IMPRESSION(S) / ED DIAGNOSES  Final diagnoses:  Acute non-recurrent frontal sinusitis      NEW MEDICATIONS STARTED DURING THIS VISIT:  Discharge Medication List as of 03/29/2016 10:04 PM    START taking these medications   Details  amoxicillin-clavulanate (AUGMENTIN) 875-125 MG tablet Take 1 tablet by mouth 2 (two) times daily., Starting Thu 03/29/2016, Print    cetirizine (ZYRTEC) 10 MG tablet Take 1 tablet (10 mg total) by mouth daily., Starting Thu 03/29/2016, Print    fluticasone (FLONASE) 50 MCG/ACT nasal spray Place 1 spray into both nostrils 2 (two) times daily., Starting Thu 03/29/2016, Print            This chart was dictated using voice recognition software/Dragon. Despite best efforts to proofread, errors can occur which can change the meaning. Any change was purely unintentional.    Darletta Moll, PA-C 03/30/16 1102    Lavonia Drafts, MD 03/31/16 [phone]

## 2016-04-05 ENCOUNTER — Other Ambulatory Visit (HOSPITAL_COMMUNITY): Payer: Self-pay | Admitting: Adult Health

## 2016-04-09 ENCOUNTER — Encounter: Payer: Self-pay | Admitting: Family

## 2016-04-09 ENCOUNTER — Ambulatory Visit: Payer: Medicare Other | Attending: Family | Admitting: Family

## 2016-04-09 VITALS — BP 158/99 | HR 87 | Resp 18 | Ht 76.0 in | Wt 187.0 lb

## 2016-04-09 DIAGNOSIS — I10 Essential (primary) hypertension: Secondary | ICD-10-CM

## 2016-04-09 DIAGNOSIS — E785 Hyperlipidemia, unspecified: Secondary | ICD-10-CM | POA: Insufficient documentation

## 2016-04-09 DIAGNOSIS — M1990 Unspecified osteoarthritis, unspecified site: Secondary | ICD-10-CM | POA: Diagnosis not present

## 2016-04-09 DIAGNOSIS — I2510 Atherosclerotic heart disease of native coronary artery without angina pectoris: Secondary | ICD-10-CM | POA: Insufficient documentation

## 2016-04-09 DIAGNOSIS — F17210 Nicotine dependence, cigarettes, uncomplicated: Secondary | ICD-10-CM | POA: Diagnosis not present

## 2016-04-09 DIAGNOSIS — M109 Gout, unspecified: Secondary | ICD-10-CM | POA: Diagnosis not present

## 2016-04-09 DIAGNOSIS — I110 Hypertensive heart disease with heart failure: Secondary | ICD-10-CM | POA: Insufficient documentation

## 2016-04-09 DIAGNOSIS — Z8249 Family history of ischemic heart disease and other diseases of the circulatory system: Secondary | ICD-10-CM | POA: Diagnosis not present

## 2016-04-09 DIAGNOSIS — Z888 Allergy status to other drugs, medicaments and biological substances status: Secondary | ICD-10-CM | POA: Insufficient documentation

## 2016-04-09 DIAGNOSIS — Z79899 Other long term (current) drug therapy: Secondary | ICD-10-CM | POA: Diagnosis not present

## 2016-04-09 DIAGNOSIS — Z7982 Long term (current) use of aspirin: Secondary | ICD-10-CM | POA: Insufficient documentation

## 2016-04-09 DIAGNOSIS — Z7951 Long term (current) use of inhaled steroids: Secondary | ICD-10-CM | POA: Insufficient documentation

## 2016-04-09 DIAGNOSIS — E119 Type 2 diabetes mellitus without complications: Secondary | ICD-10-CM | POA: Diagnosis not present

## 2016-04-09 DIAGNOSIS — J45909 Unspecified asthma, uncomplicated: Secondary | ICD-10-CM | POA: Insufficient documentation

## 2016-04-09 DIAGNOSIS — Z720 Tobacco use: Secondary | ICD-10-CM

## 2016-04-09 DIAGNOSIS — I5022 Chronic systolic (congestive) heart failure: Secondary | ICD-10-CM | POA: Diagnosis not present

## 2016-04-09 DIAGNOSIS — Z833 Family history of diabetes mellitus: Secondary | ICD-10-CM | POA: Insufficient documentation

## 2016-04-09 LAB — BASIC METABOLIC PANEL
Anion gap: 7 (ref 5–15)
BUN: 32 mg/dL — ABNORMAL HIGH (ref 6–20)
CALCIUM: 9.3 mg/dL (ref 8.9–10.3)
CO2: 27 mmol/L (ref 22–32)
CREATININE: 1.42 mg/dL — AB (ref 0.61–1.24)
Chloride: 105 mmol/L (ref 101–111)
GFR, EST NON AFRICAN AMERICAN: 52 mL/min — AB (ref >60)
Glucose, Bld: 102 mg/dL — ABNORMAL HIGH (ref 65–99)
Potassium: 4.9 mmol/L (ref 3.5–5.1)
Sodium: 139 mmol/L (ref 135–145)

## 2016-04-09 NOTE — Progress Notes (Signed)
Patient ID: Travis [name], male    DOB: January 13, 1956, 61 y.o.   MRN: [id number]  HPI Mr Preslar is a 61 y/o male with a history of osteoarthritis, hyperlipidemia, gout, HTN, GI bleed, DM, CAD, anemia, asthma, previous alcohol use, current tobacco use and chronic heart failure.  Last echo was done 01/26/15 and showed an EF of 25-30% along with trivial MR/ TR. EF has dropped from 2015 when it was 45-50%. Had a cardiac catheterization done 01/28/15 which showed 10% stenosis in Prox RCA to Mid RCA and 30% stenosis in Prox LAD to Mid LAD which is not much different from previous angiography. Cardiomyopathy felt to be due to previous alcohol use.   Was in the ED on 03/29/16 due to acute frontal sinusitis. Was treated and released. Previously was in the ED on 09/06/15 for constipation after surgery. Was given an enema with good results and he was discharged home. Was last admitted on 09/01/15 for right shoulder shoulder and was discharged the following day. Last HF admission was November 2016.  He presents today for a follow-up visit with a mild amount of fatigue and shortness of breath upon exertion. Denies any swelling in his legs/abdomen. Has not been weighing himself daily as he says that his scales are broke. Tolerated increase in [place] without known side effects. Continues to smoke.   Past Medical History:  Diagnosis Date  . Alcohol abuse   . 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 Asthma   . 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 Chronic anemia   . Chronic systolic CHF (congestive heart failure) ([place])    a. EF 20-25% by echo 12/28/10; b improved to 45-50% on echo in June 2015  . 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 Coronary artery disease    Cardiac catheterization in November 2016 showed mild 30% proximal LAD stenosis with no evidence of obstructive disease.  . Diabetes mellitus, type 2 ([place])    pt reports his DM is gone  . GI  bleed    15 years ago  . Gout   . Hemoptysis    secondary to pulmonary edema  . Hyperlipidemia   . Hypertension   . Nonischemic cardiomyopathy ([place])    minimal coronary disease by cath 12/29/10  . Osteoarthritis   . Pneumonia   . Tobacco abuse   . Vision loss    peripherial vision only left eye.[place]  artery occusion    Past Surgical History:  Procedure Laterality Date  . CARDIAC CATHETERIZATION    . CARDIAC CATHETERIZATION N/A 01/28/2015   Procedure: Left Heart Cath and Coronary Angiography;  Surgeon: Wellington Hampshire, MD;  Location: [place] [facility];  Service: Cardiovascular;  Laterality: N/A;  . COLONOSCOPY    . HIP ARTHROPLASTY Right 03/15/2013   Procedure: ARTHROPLASTY BIPOLAR HIP;  Surgeon: [name], MD;  Location: [place];  Service: Orthopedics;  Laterality: Right;  . TOTAL SHOULDER ARTHROPLASTY Right 09/01/2015  . TOTAL SHOULDER ARTHROPLASTY Right 09/01/2015   Procedure: RIGHT TOTAL SHOULDER ARTHROPLASTY;  Surgeon: [name], MD;  Location: [place];  Service: Orthopedics;  Laterality: Right;   Family History  Problem Relation Age of Onset  . Diabetes Mother   . Hypertension Mother   . Diabetes Father   . Hypertension Father   . Cancer Neg Hx   . Heart disease Neg Hx   . Stroke Neg Hx    Social History  Substance Use  Topics  . Smoking status: Current Every Day Smoker    Packs/day: 1.00    Years: 30.00    Types: Cigarettes  . Smokeless tobacco: Never Used  . Alcohol use 0.6 oz/week    1 Glasses of wine per week     Comment: occasionally   Allergies  Allergen Reactions  . Tramadol Itching and Rash   Prior to Admission medications   Medication Sig Start Date End Date Taking? Authorizing Provider  albuterol (PROVENTIL HFA;VENTOLIN HFA) 108 (90 BASE) MCG/ACT inhaler Inhale 2 puffs into the lungs every 6 (six) hours as needed for wheezing or shortness of breath. Take 2 puffs every 5-10 minutes up to 6 puffs total over 15 minutes when  needed.  Use with a spacer. Patient taking differently: Inhale 2 puffs into the lungs every 6 (six) hours as needed for wheezing or shortness of breath. Take 2 puffs up to 6 puffs total over 15 minutes when needed.  Use with a spacer. 01/24/15  Yes Ahmed Prima, MD  allopurinol (ZYLOPRIM) 100 MG tablet Take 100 mg by mouth daily.   Yes Historical Provider, MD  aspirin EC 325 MG tablet Take 162.5 mg by mouth daily.   Yes Historical Provider, MD  carvedilol (COREG) 12.5 MG tablet Take 1 tablet (12.5 mg total) by mouth 2 (two) times daily with a meal. 10/20/15  Yes Alisa Graff, FNP  cetirizine (ZYRTEC) 10 MG tablet Take 1 tablet (10 mg total) by mouth daily. 03/29/16  Yes Jonathan D Cuthriell, PA-C  fenofibrate 54 MG tablet TAKE 1 TABLET BY MOUTH ONCE A DAY 04/06/16  Yes Amy D Clegg, NP  fluticasone (FLONASE) 50 MCG/ACT nasal spray Place 1 spray into both nostrils 2 (two) times daily. 03/29/16  Yes Roderic Palau D Cuthriell, PA-C  folic acid (FOLVITE) 1 MG tablet Take 1 mg by mouth daily.   Yes Historical Provider, MD  Multiple Vitamin (MULTIVITAMIN WITH MINERALS) TABS tablet Take 1 tablet by mouth daily. Men's One a Day   Yes Historical Provider, MD  naproxen (NAPROSYN) 500 MG tablet Take 500 mg by mouth 2 (two) times daily as needed for moderate pain (pain).    Yes Historical Provider, MD  Omega-3 Fatty Acids (FISH OIL PO) Take 1 capsule by mouth daily.   Yes Historical Provider, MD  sacubitril-valsartan (ENTRESTO) 49-51 MG Take 1 tablet by mouth 2 (two) times daily. 02/27/16  Yes Alisa Graff, FNP  spironolactone (ALDACTONE) 25 MG tablet Take 0.5 tablets (12.5 mg total) by mouth daily. 12/01/15  Yes Alisa Graff, FNP      Review of Systems  Constitutional: Positive for fatigue. Negative for appetite change and fever.  HENT: Positive for rhinorrhea. Negative for congestion and sore throat.   Eyes: Negative.   Respiratory: Positive for shortness of breath. Negative for cough and chest tightness.    Cardiovascular: Negative for chest pain, palpitations and leg swelling.  Gastrointestinal: Negative for abdominal distention and abdominal pain.  Endocrine: Negative.   Genitourinary: Negative.   Musculoskeletal: Positive for back pain. Negative for neck pain.  Skin: Negative.   Allergic/Immunologic: Negative.   Neurological: Negative for dizziness and light-headedness.  Hematological: Negative for adenopathy. Does not bruise/bleed easily.  Psychiatric/Behavioral: Negative for dysphoric mood and sleep disturbance (sleeping on 1 pillow). The patient is not nervous/anxious.    Vitals:   04/09/16 1024  BP: (!) 158/99  Pulse: 87  Resp: 18  SpO2: 100%  Weight: 187 lb (84.8 kg)  Height: 6\' 4"  (1.93  m)   Wt Readings from Last 3 Encounters:  04/09/16 187 lb (84.8 kg)  03/29/16 185 lb (83.9 kg)  02/27/16 186 lb (84.4 kg)   Lab Results  Component Value Date   CREATININE 1.42 (H) 04/09/2016   CREATININE 1.21 09/06/2015   CREATININE 1.30 (H) 09/02/2015   Physical Exam  Constitutional: He is oriented to person, place, and time. He appears well-developed and well-nourished.  HENT:  Head: Normocephalic and atraumatic.  Eyes: Conjunctivae are normal. Pupils are equal, round, and reactive to light.  Neck: Normal range of motion. Neck supple. No JVD present.  Cardiovascular: Normal rate and regular rhythm.   Pulmonary/Chest: Effort normal. He has no wheezes. He has no rales.  Abdominal: Soft. He exhibits no distension. There is no tenderness.  Musculoskeletal: He exhibits no edema or tenderness.  Neurological: He is alert and oriented to person, place, and time.  Skin: Skin is warm and dry.  Psychiatric: He has a normal mood and affect. His behavior is normal. Thought content normal.  Nursing note and vitals reviewed.  Assessment & Plan:  1: Chronic heart failure with reduced ejection fraction- - NYHA class II - euvolemic - resume weighing daily and call for an overnight weight  gain of >2 pounds/weekly weight gain of >5 pounds. He says that he is able to afford to buy new scales. - adding "a little" salt to his food and he was encouraged to not add any salt - tolerated increase in entresto at last visit without known side effects. May increase it again next time - BMP drawn today and results received prior to the note being finished. K+ 4.9 and Cr 1.42 - saw cardiologist Fletcher Anon) July 2017 and returns to him in 6 months. Needs to get echo repeated as last one was done November 2016. - received his flu vaccine for the season  2: HTN- - BP mildly high even on recheck - discussed increasing entresto again at next visit - he says that he hasn't missed any of his medications - needs to call and make an appointment with his PCP. He says that he will call today.  3: Tobacco use- - continues to smoke about 1 ppd of cigarettes and said he smoked right before coming to the office - reports never quitting before and doesn't desire to quit at this time - complete cessation discussed for 3 minutes with him  Patient did not bring his medications nor a list. Each medication was verbally reviewed with the patient and he was encouraged to bring the bottles to every visit to confirm accuracy of list.  Return in 2 months or sooner for any questions/problems before then.

## 2016-04-10 NOTE — Patient Instructions (Signed)
Continue weighing daily and call for an overnight weight gain of > 2 pounds or a weekly weight gain of >5 pounds. 

## 2016-04-28 ENCOUNTER — Other Ambulatory Visit (HOSPITAL_COMMUNITY): Payer: Self-pay | Admitting: Adult Health

## 2016-05-09 ENCOUNTER — Encounter: Payer: Self-pay | Admitting: Internal Medicine

## 2016-05-09 ENCOUNTER — Ambulatory Visit (INDEPENDENT_AMBULATORY_CARE_PROVIDER_SITE_OTHER): Payer: Medicare Other | Admitting: Internal Medicine

## 2016-05-09 VITALS — BP 140/88 | HR 95 | Temp 98.0°F | Ht 73.5 in | Wt 191.0 lb

## 2016-05-09 DIAGNOSIS — Z125 Encounter for screening for malignant neoplasm of prostate: Secondary | ICD-10-CM

## 2016-05-09 DIAGNOSIS — E7800 Pure hypercholesterolemia, unspecified: Secondary | ICD-10-CM

## 2016-05-09 DIAGNOSIS — J4520 Mild intermittent asthma, uncomplicated: Secondary | ICD-10-CM

## 2016-05-09 DIAGNOSIS — Z1159 Encounter for screening for other viral diseases: Secondary | ICD-10-CM | POA: Diagnosis not present

## 2016-05-09 DIAGNOSIS — E119 Type 2 diabetes mellitus without complications: Secondary | ICD-10-CM

## 2016-05-09 DIAGNOSIS — I10 Essential (primary) hypertension: Secondary | ICD-10-CM

## 2016-05-09 DIAGNOSIS — I5022 Chronic systolic (congestive) heart failure: Secondary | ICD-10-CM

## 2016-05-09 DIAGNOSIS — J449 Chronic obstructive pulmonary disease, unspecified: Secondary | ICD-10-CM | POA: Insufficient documentation

## 2016-05-09 DIAGNOSIS — Z114 Encounter for screening for human immunodeficiency virus [HIV]: Secondary | ICD-10-CM | POA: Diagnosis not present

## 2016-05-09 DIAGNOSIS — I428 Other cardiomyopathies: Secondary | ICD-10-CM

## 2016-05-09 DIAGNOSIS — J45909 Unspecified asthma, uncomplicated: Secondary | ICD-10-CM

## 2016-05-09 DIAGNOSIS — Z0000 Encounter for general adult medical examination without abnormal findings: Secondary | ICD-10-CM | POA: Diagnosis not present

## 2016-05-09 DIAGNOSIS — M150 Primary generalized (osteo)arthritis: Secondary | ICD-10-CM

## 2016-05-09 DIAGNOSIS — M1990 Unspecified osteoarthritis, unspecified site: Secondary | ICD-10-CM | POA: Insufficient documentation

## 2016-05-09 DIAGNOSIS — M1000 Idiopathic gout, unspecified site: Secondary | ICD-10-CM

## 2016-05-09 DIAGNOSIS — Z1211 Encounter for screening for malignant neoplasm of colon: Secondary | ICD-10-CM

## 2016-05-09 DIAGNOSIS — M159 Polyosteoarthritis, unspecified: Secondary | ICD-10-CM

## 2016-05-09 DIAGNOSIS — M8949 Other hypertrophic osteoarthropathy, multiple sites: Secondary | ICD-10-CM

## 2016-05-09 LAB — COMPREHENSIVE METABOLIC PANEL
ALT: 19 U/L (ref 0–53)
AST: 26 U/L (ref 0–37)
Albumin: 4.3 g/dL (ref 3.5–5.2)
Alkaline Phosphatase: 64 U/L (ref 39–117)
BILIRUBIN TOTAL: 0.5 mg/dL (ref 0.2–1.2)
BUN: 19 mg/dL (ref 6–23)
CO2: 31 meq/L (ref 19–32)
CREATININE: 1.25 mg/dL (ref 0.40–1.50)
Calcium: 9.9 mg/dL (ref 8.4–10.5)
Chloride: 104 mEq/L (ref 96–112)
GFR: 75.69 mL/min (ref >60.00)
GLUCOSE: 118 mg/dL — AB (ref 70–99)
Potassium: 4.5 mEq/L (ref 3.5–5.1)
Sodium: 139 mEq/L (ref 135–145)
Total Protein: 6.7 g/dL (ref 6.0–8.3)

## 2016-05-09 LAB — CBC
HCT: 39.9 % (ref 39.0–52.0)
Hemoglobin: 13.2 g/dL (ref 13.0–17.0)
MCHC: 33.2 g/dL (ref 30.0–36.0)
MCV: 95.9 fl (ref 78.0–100.0)
Platelets: 225 10*3/uL (ref 150.0–400.0)
RBC: 4.16 Mil/uL — ABNORMAL LOW (ref 4.22–5.81)
RDW: 15 % (ref 11.5–15.5)
WBC: 4.7 10*3/uL (ref 4.0–10.5)

## 2016-05-09 LAB — LDL CHOLESTEROL, DIRECT: LDL DIRECT: 63 mg/dL

## 2016-05-09 LAB — LIPID PANEL
Cholesterol: 205 mg/dL — ABNORMAL HIGH (ref 0–200)
HDL: 51.5 mg/dL (ref >39.00)
Total CHOL/HDL Ratio: 4
Triglycerides: 649 mg/dL — ABNORMAL HIGH (ref 0.0–149.0)

## 2016-05-09 LAB — HEMOGLOBIN A1C: Hgb A1c MFr Bld: 6.3 % (ref 4.6–6.5)

## 2016-05-09 LAB — PSA, MEDICARE: PSA: 0.68 ng/ml (ref 0.10–4.00)

## 2016-05-09 MED ORDER — CETIRIZINE HCL 10 MG PO TABS: 10.0000 mg | ORAL_TABLET | Freq: Every day | ORAL | 11 refills | Status: DC

## 2016-05-09 MED ORDER — TRIAMCINOLONE ACETONIDE 0.1 % EX CREA: 1.0000 "application " | TOPICAL_CREAM | Freq: Two times a day (BID) | CUTANEOUS | 0 refills | Status: DC

## 2016-05-09 NOTE — Assessment & Plan Note (Signed)
Start daily zyrtec, there may be an allergy component Continue Albuterol for now

## 2016-05-09 NOTE — Assessment & Plan Note (Signed)
Elevated but given his age an comorbidites, I would not adjust his meds at this time Will monitor

## 2016-05-09 NOTE — Assessment & Plan Note (Signed)
Encouraged him to consume a low carb diet A1C today No microalbumin secondary to ARB therapy Eye exam UTD Foot exam today Flu and pneumovax UTD

## 2016-05-09 NOTE — Patient Instructions (Signed)

## 2016-05-09 NOTE — Assessment & Plan Note (Signed)
Continue Tylenol as needed for now

## 2016-05-09 NOTE — Assessment & Plan Note (Signed)
Compensated Continue current meds Continue to follow with cardiology

## 2016-05-09 NOTE — Progress Notes (Signed)
HPI:  Pt presents to the clinic today for his Medicare Wellness Exam. He is also due to follow up chronic conditions.  Cardiomyopathy: He follows with Dr. Fletcher Anon. He is taking Entresto, Carvedilol, ASA and Spironolactone as prescribed. ECG from 03/2016 and echo from 01/2015 reviewed.  Asthma, Mild Intermittent: He uses his Albuterol inhaler very rarely.  CHF: He denies lower extremity edema, and has mild shortness of breath (chronic). He is taking Entresto, Carvedilol, ASA and Spironolactone as prescribed. He follows with Dr. Fletcher Anon. ECG from 03/2016 and echo from 01/2015 reviewed.  DM 2: His last A1C was 6.7%. He is not taking any diabetic medication at this time. He does not check his sugars. He does not check his feet. His last eye exam was 06/2015. Flu 12/2015. Pneumovax 02/2013.   Gout: His uric acid from 07/2014 was normal. He is taking Allopurinol as prescribed. His last gout flare was > 6 months ago.  HTN: His BP today is 140/88. He is taking Entresto, Carvadilol and Spironolactone as prescribed.   HLD with CAD: His last LDL was 87.3, in 2015. He is taking Fish Oil but no prescribed cholesterol lowering medications. He does not eat a low fat diet.  OA: Mainly in his shoulders, back and knees. He recently had a right shoulder replacement. He takes Tylenol with good relief.  Past Medical History:  Diagnosis Date  . Alcohol abuse   . 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 Asthma   . 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 Chronic anemia   . Chronic systolic CHF (congestive heart failure) ([place])    a. EF 20-25% by echo 12/28/10; b improved to 45-50% on echo in June 2015  . 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 Coronary artery disease    Cardiac catheterization in November 2016 showed mild 30% proximal LAD stenosis with no evidence of obstructive disease.  . Diabetes mellitus, type 2 ([place])    pt reports his DM is gone   . GI bleed    15 years ago  . Gout   . Hemoptysis    secondary to pulmonary edema  . Hyperlipidemia   . Hypertension   . Nonischemic cardiomyopathy ([place])    minimal coronary disease by cath 12/29/10  . Osteoarthritis   . Pneumonia   . Tobacco abuse   . Vision loss    peripherial vision only left eye.[place]  artery occusion     Current Outpatient Prescriptions  Medication Sig Dispense Refill  . albuterol (PROVENTIL HFA;VENTOLIN HFA) 108 (90 BASE) MCG/ACT inhaler Inhale 2 puffs into the lungs every 6 (six) hours as needed for wheezing or shortness of breath. Take 2 puffs every 5-10 minutes up to 6 puffs total over 15 minutes when needed.  Use with a spacer. (Patient taking differently: Inhale 2 puffs into the lungs every 6 (six) hours as needed for wheezing or shortness of breath. Take 2 puffs up to 6 puffs total over 15 minutes when needed.  Use with a spacer.) 1 Inhaler 2  . allopurinol (ZYLOPRIM) 100 MG tablet Take 100 mg by mouth daily.    [name] aspirin EC 325 MG tablet Take 162.5 mg by mouth daily.    . carvedilol (COREG) 12.5 MG tablet Take 1 tablet (12.5 mg total) by mouth 2 (two) times daily with a meal. 60 tablet 5  . cetirizine (ZYRTEC) 10 MG tablet Take 1 tablet (10 mg total) by mouth daily. 30 tablet  0  . fenofibrate 54 MG tablet TAKE 1 TABLET BY MOUTH ONCE A DAY 30 tablet 3  . fluticasone (FLONASE) 50 MCG/ACT nasal spray Place 1 spray into both nostrils 2 (two) times daily. 16 g 0  . folic acid (FOLVITE) 1 MG tablet Take 1 mg by mouth daily.    . folic acid (FOLVITE) 1 MG tablet TAKE 1 TABLET BY MOUTH ONCE A DAY 30 tablet 3  . Multiple Vitamin (MULTIVITAMIN WITH MINERALS) TABS tablet Take 1 tablet by mouth daily. Men's One a Day    . naproxen (NAPROSYN) 500 MG tablet Take 500 mg by mouth 2 (two) times daily as needed for moderate pain (pain).     . Omega-3 Fatty Acids (FISH OIL PO) Take 1 capsule by mouth daily.    . sacubitril-valsartan (ENTRESTO) 49-51 MG Take 1  tablet by mouth 2 (two) times daily. 60 tablet 5  . spironolactone (ALDACTONE) 25 MG tablet Take 0.5 tablets (12.5 mg total) by mouth daily. 30 tablet 3   No current facility-administered medications for this visit.     Allergies  Allergen Reactions  . Tramadol Itching and Rash    Family History  Problem Relation Age of Onset  . Diabetes Mother   . Hypertension Mother   . Diabetes Father   . Hypertension Father   . Cancer Neg Hx   . Heart disease Neg Hx   . Stroke Neg Hx     Social History   Social History  . Marital status: Single    Spouse name: N/A  . Number of children: N/A  . Years of education: N/A   Occupational History  . Not on file.   Social History Main Topics  . Smoking status: Current Every Day Smoker    Packs/day: 1.00    Years: 30.00    Types: Cigarettes  . Smokeless tobacco: Never Used  . Alcohol use 0.6 oz/week    1 Glasses of wine per week     Comment: occasionally  . Drug use: Yes    Types: Marijuana     Comment: 1 blunt 1-2 times a month- 08/29/15/17- "last week"  . Sexual activity: Yes   Other Topics Concern  . Not on file   Social History Narrative  . No narrative on file    Hospitiliaztions: None  Health Maintenance:    Flu: 12/2015  Tetanus: unsure  Pneumovax: 02/2013  Zostavax: never  PSA: unsure  Colon Screening: ? 2008  Eye Doctor: 06/29/2015  Dental Exam: as needed   Providers:   PCP: [name], NP-C  Orthopedist: Dr. Onnie Graham  Cardiologist: Dr. Fletcher Anon    I have personally reviewed and have noted:  1. The patient's medical and social history 2. Their use of alcohol, tobacco or illicit drugs 3. Their current medications and supplements 4. The patient's functional ability including ADL's, fall risks, home safety risks and hearing or visual impairment. 5. Diet and physical activities 6. Evidence for depression or mood disorder  Subjective:   Review of Systems:   Constitutional: Denies fever, malaise, fatigue,  headache or abrupt weight changes.  HEENT: Pt reports blurred vision. Denies eye pain, eye redness, ear pain, ringing in the ears, wax buildup, runny nose, nasal congestion, bloody nose, or sore throat. Respiratory: Denies difficulty breathing, shortness of breath, cough or sputum production.   Cardiovascular: Denies chest pain, chest tightness, palpitations or swelling in the hands or feet.  Gastrointestinal: Denies abdominal pain, bloating, constipation, diarrhea or blood in the stool.  GU: Denies urgency, frequency, pain with urination, burning sensation, blood in urine, odor or discharge. Musculoskeletal: Pt reports intermittent joint pain. Denies decrease in range of motion, difficulty with gait, muscle pain or joint swelling.  Skin: Denies redness, rashes, lesions or ulcercations.  Neurological: Denies dizziness, difficulty with memory, difficulty with speech or problems with balance and coordination.  Psych: Denies anxiety, depression, SI/HI.  No other specific complaints in a complete review of systems (except as listed in HPI above).  Objective:  PE:   BP 140/88   Pulse 95   Temp 98 F (36.7 C) (Oral)   Ht 6' 1.5" (1.867 m)   Wt 191 lb (86.6 kg)   SpO2 98%   BMI 24.86 kg/m   Wt Readings from Last 3 Encounters:  04/09/16 187 lb (84.8 kg)  03/29/16 185 lb (83.9 kg)  02/27/16 186 lb (84.4 kg)    General: Appears his stated age, well developed, well nourished in NAD. Skin: Warm, dry and intact. Cardiovascular: Normal rate and rhythm. S1,S2 noted.  No murmur, rubs or gallops noted. No JVD or BLE edema. No carotid bruits noted. Pulmonary/Chest: Normal effort and positive vesicular breath sounds. No respiratory distress. No wheezes, rales or ronchi noted.  Musculoskeletal: Strength 5/5 BUE/BLE. No difficulty with gait. Neurological: Alert and oriented.  Psychiatric: Mood and affect normal. Behavior is normal. Judgment and thought content normal.     BMET    Component  Value Date/Time   NA 139 04/09/2016 1107   NA 140 05/23/2015 1038   NA 141 04/20/2014 1019   K 4.9 04/09/2016 1107   K 3.7 04/20/2014 1019   CL 105 04/09/2016 1107   CL 106 04/20/2014 1019   CO2 27 04/09/2016 1107   CO2 30 04/20/2014 1019   GLUCOSE 102 (H) 04/09/2016 1107   GLUCOSE 102 (H) 04/20/2014 1019   BUN 32 (H) 04/09/2016 1107   BUN 18 05/23/2015 1038   BUN 14 04/20/2014 1019   CREATININE 1.42 (H) 04/09/2016 1107   CREATININE 1.18 04/20/2014 1019   CALCIUM 9.3 04/09/2016 1107   CALCIUM 9.3 04/20/2014 1019   GFRNONAA 52 (L) 04/09/2016 1107   GFRNONAA >60 04/20/2014 1019   GFRNONAA >60 11/19/2012 1107   GFRAA >60 04/09/2016 1107   GFRAA >60 04/20/2014 1019   GFRAA >60 11/19/2012 1107    Lipid Panel     Component Value Date/Time   CHOL 153 12/31/2013 1403   TRIG 221.0 (H) 12/31/2013 1403   HDL 28.70 (L) 12/31/2013 1403   CHOLHDL 5 12/31/2013 1403   VLDL 44.2 (H) 12/31/2013 1403   LDLCALC UNABLE TO CALCULATE IF TRIGLYCERIDE OVER 400 mg/dL 09/14/2013 0510    CBC    Component Value Date/Time   WBC 8.8 09/06/2015 1632   RBC 3.60 (L) 09/06/2015 1632   HGB 11.0 (L) 09/06/2015 1632   HGB 13.5 04/20/2014 1019   HCT 32.3 (L) 09/06/2015 1632   HCT 41.8 04/20/2014 1019   PLT 277 09/06/2015 1632   PLT 348 04/20/2014 1019   MCV 89.9 09/06/2015 1632   MCV 90 04/20/2014 1019   MCH 30.6 09/06/2015 1632   MCHC 34.1 09/06/2015 1632   RDW 14.5 09/06/2015 1632   RDW 16.2 (H) 04/20/2014 1019   LYMPHSABS 1.9 09/06/2015 1632   LYMPHSABS 2.1 04/20/2014 1019   MONOABS 0.4 09/06/2015 1632   MONOABS 0.5 04/20/2014 1019   EOSABS 1.3 (H) 09/06/2015 1632   EOSABS 0.3 04/20/2014 1019   BASOSABS 0.0 09/06/2015 1632   BASOSABS  0.1 04/20/2014 1019   BASOSABS 0 06/13/2012 1515    Hgb A1C Lab Results  Component Value Date   HGBA1C 6.7 (H) 08/29/2015      Assessment and Plan:   Medicare Annual Wellness Visit:  Diet: Her does eat meat. He consumes fruits and veggies daily.  He does eat fried foods. He drinks juice, water and soda. Physical activity: None Depression/mood screen: Negative Hearing: Intact to whispered voice Visual acuity: Grossly normal, performs annual eye exam  ADLs: Capable Fall risk: None Home safety: Good Cognitive evaluation: Intact to orientation, naming, recall and repetition EOL planning: No adv directives, full code/ I agree  Preventative Medicine: Flu and pneumovax UTD. He declines tetanus for financial reasons. He declines zostovax. PSA will be done with labs. Referral to GI for colonoscopy. Encouraged him to consume a balanced diet and exercise regimen. Advised him to see an eye doctor and dentist annually. Will check CBC, CMET, Lipid, A1C , HIV and Hep C today.   Next appointment: 1 year    [name], NP

## 2016-05-09 NOTE — Assessment & Plan Note (Signed)
Uric acid level today Continue Allopurinol for now

## 2016-05-09 NOTE — Assessment & Plan Note (Signed)
Encouraged him to consume a low fat diet CMET and Lipid Profile today

## 2016-05-09 NOTE — Assessment & Plan Note (Signed)
Asymptomatic Continue current meds Continue to follow with cardiology

## 2016-05-10 LAB — HEPATITIS C ANTIBODY: HCV Ab: NEGATIVE

## 2016-05-10 LAB — HIV ANTIBODY (ROUTINE TESTING W REFLEX): HIV 1&2 Ab, 4th Generation: NONREACTIVE

## 2016-05-15 NOTE — Addendum Note (Signed)
Addended by: Lurlean Nanny on: 05/15/2016 01:57 PM   Modules accepted: Orders

## 2016-05-16 ENCOUNTER — Encounter: Payer: Self-pay | Admitting: Gastroenterology

## 2016-05-16 MED ORDER — FENOFIBRATE 145 MG PO TABS: 145.0000 mg | ORAL_TABLET | Freq: Every day | ORAL | 2 refills | Status: DC

## 2016-05-16 NOTE — Addendum Note (Signed)
Addended by: Lurlean Nanny on: 05/16/2016 01:37 PM   Modules accepted: Orders

## 2016-05-29 ENCOUNTER — Other Ambulatory Visit: Payer: Medicare Other

## 2016-05-29 ENCOUNTER — Other Ambulatory Visit: Payer: Self-pay | Admitting: Internal Medicine

## 2016-05-29 DIAGNOSIS — E119 Type 2 diabetes mellitus without complications: Secondary | ICD-10-CM

## 2016-05-29 LAB — MICROALBUMIN / CREATININE URINE RATIO
Creatinine,U: 106.8 mg/dL
MICROALB UR: 1.3 mg/dL (ref 0.0–1.9)
MICROALB/CREAT RATIO: 1.2 mg/g (ref 0.0–30.0)

## 2016-06-07 ENCOUNTER — Encounter: Payer: Self-pay | Admitting: Cardiovascular Disease

## 2016-06-07 ENCOUNTER — Ambulatory Visit: Payer: Medicare Other | Attending: Family | Admitting: Family

## 2016-06-07 ENCOUNTER — Encounter: Payer: Self-pay | Admitting: Family

## 2016-06-07 ENCOUNTER — Ambulatory Visit (INDEPENDENT_AMBULATORY_CARE_PROVIDER_SITE_OTHER): Payer: Medicare Other | Admitting: Cardiovascular Disease

## 2016-06-07 VITALS — BP 140/82 | HR 80 | Ht 76.0 in | Wt 191.0 lb

## 2016-06-07 VITALS — BP 147/92 | HR 86 | Resp 18 | Ht 76.0 in | Wt 190.1 lb

## 2016-06-07 DIAGNOSIS — I10 Essential (primary) hypertension: Secondary | ICD-10-CM

## 2016-06-07 DIAGNOSIS — Z7982 Long term (current) use of aspirin: Secondary | ICD-10-CM | POA: Diagnosis not present

## 2016-06-07 DIAGNOSIS — I5022 Chronic systolic (congestive) heart failure: Secondary | ICD-10-CM

## 2016-06-07 DIAGNOSIS — I509 Heart failure, unspecified: Secondary | ICD-10-CM | POA: Diagnosis present

## 2016-06-07 DIAGNOSIS — Z79899 Other long term (current) drug therapy: Secondary | ICD-10-CM | POA: Diagnosis not present

## 2016-06-07 DIAGNOSIS — J45909 Unspecified asthma, uncomplicated: Secondary | ICD-10-CM | POA: Diagnosis not present

## 2016-06-07 DIAGNOSIS — Z8249 Family history of ischemic heart disease and other diseases of the circulatory system: Secondary | ICD-10-CM | POA: Insufficient documentation

## 2016-06-07 DIAGNOSIS — Z720 Tobacco use: Secondary | ICD-10-CM

## 2016-06-07 DIAGNOSIS — M109 Gout, unspecified: Secondary | ICD-10-CM | POA: Insufficient documentation

## 2016-06-07 DIAGNOSIS — M1990 Unspecified osteoarthritis, unspecified site: Secondary | ICD-10-CM | POA: Diagnosis not present

## 2016-06-07 DIAGNOSIS — F17210 Nicotine dependence, cigarettes, uncomplicated: Secondary | ICD-10-CM | POA: Insufficient documentation

## 2016-06-07 DIAGNOSIS — E785 Hyperlipidemia, unspecified: Secondary | ICD-10-CM | POA: Diagnosis not present

## 2016-06-07 DIAGNOSIS — Z7951 Long term (current) use of inhaled steroids: Secondary | ICD-10-CM | POA: Diagnosis not present

## 2016-06-07 DIAGNOSIS — I110 Hypertensive heart disease with heart failure: Secondary | ICD-10-CM | POA: Insufficient documentation

## 2016-06-07 DIAGNOSIS — Z96641 Presence of right artificial hip joint: Secondary | ICD-10-CM | POA: Diagnosis not present

## 2016-06-07 DIAGNOSIS — Z96611 Presence of right artificial shoulder joint: Secondary | ICD-10-CM | POA: Diagnosis not present

## 2016-06-07 DIAGNOSIS — Z888 Allergy status to other drugs, medicaments and biological substances status: Secondary | ICD-10-CM | POA: Diagnosis not present

## 2016-06-07 DIAGNOSIS — Z833 Family history of diabetes mellitus: Secondary | ICD-10-CM | POA: Insufficient documentation

## 2016-06-07 DIAGNOSIS — D649 Anemia, unspecified: Secondary | ICD-10-CM | POA: Insufficient documentation

## 2016-06-07 DIAGNOSIS — I2510 Atherosclerotic heart disease of native coronary artery without angina pectoris: Secondary | ICD-10-CM | POA: Diagnosis not present

## 2016-06-07 MED ORDER — CETIRIZINE HCL 10 MG PO TABS: 10.0000 mg | ORAL_TABLET | Freq: Every day | ORAL | 11 refills | Status: DC

## 2016-06-07 NOTE — Patient Instructions (Signed)
Continue weighing daily and call for an overnight weight gain of > 2 pounds or a weekly weight gain of >5 pounds. 

## 2016-06-07 NOTE — Patient Instructions (Addendum)
Medication Instructions:  Your physician recommends that you continue on your current medications as directed. Please refer to the Current Medication list given to you today.   Labwork: none  Testing/Procedures: Your physician has requested that you have an echocardiogram. Echocardiography is a painless test that uses sound waves to create images of your heart. It provides your doctor with information about the size and shape of your heart and how well your heart's chambers and valves are working. This procedure takes approximately one hour. There are no restrictions for this procedure.    Follow-Up: Your physician wants you to follow-up in: six months with Dr. Fletcher Anon.  You will receive a reminder letter in the mail two months in advance. If you don't receive a letter, please call our office to schedule the follow-up appointment.   Any Other Special Instructions Will Be Listed Below (If Applicable).     If you need a refill on your cardiac medications before your next appointment, please call your pharmacy.  Echocardiogram An echocardiogram, or echocardiography, uses sound waves (ultrasound) to produce an image of your heart. The echocardiogram is simple, painless, obtained within a short period of time, and offers valuable information to your health care provider. The images from an echocardiogram can provide information such as:  Evidence of coronary artery disease (CAD).  Heart size.  Heart muscle function.  Heart valve function.  Aneurysm detection.  Evidence of a past heart attack.  Fluid buildup around the heart.  Heart muscle thickening.  Assess heart valve function. Tell a health care provider about:  Any allergies you have.  All medicines you are taking, including vitamins, herbs, eye drops, creams, and over-the-counter medicines.  Any problems you or family members have had with anesthetic medicines.  Any blood disorders you have.  Any surgeries you have  had.  Any medical conditions you have.  Whether you are pregnant or may be pregnant. What happens before the procedure? No special preparation is needed. Eat and drink normally. What happens during the procedure?  In order to produce an image of your heart, gel will be applied to your chest and a wand-like tool (transducer) will be moved over your chest. The gel will help transmit the sound waves from the transducer. The sound waves will harmlessly bounce off your heart to allow the heart images to be captured in real-time motion. These images will then be recorded.  You may need an IV to receive a medicine that improves the quality of the pictures. What happens after the procedure? You may return to your normal schedule including diet, activities, and medicines, unless your health care provider tells you otherwise. This information is not intended to replace advice given to you by your health care provider. Make sure you discuss any questions you have with your health care provider. Document Released: 03/09/2000 Document Revised: 10/29/2015 Document Reviewed: 11/17/2012 Elsevier Interactive Patient Education  2017 [employer].

## 2016-06-07 NOTE — Progress Notes (Signed)
Patient ID: Travis [name], male    DOB: 1955/12/01, 61 y.o.   MRN: [id number]  HPI  Travis [name] is a 61 y/o male with a history of osteoarthritis, hyperlipidemia, gout, HTN, GI bleed, DM, CAD, anemia, asthma, previous alcohol use, current tobacco use and chronic heart failure.  Last echo was done 01/26/15 and showed an EF of 25-30% along with trivial Travis/ TR. EF has dropped from 2015 when it was 45-50%. Had a cardiac catheterization done 01/28/15 which showed 10% stenosis in Prox RCA to Mid RCA and 30% stenosis in Prox LAD to Mid LAD which is not much different from previous angiography. Cardiomyopathy felt to be due to previous alcohol use.   Was in the ED on 03/29/16 due to acute frontal sinusitis. Was treated and released. Previously was in the ED on 09/06/15 for constipation after surgery. Was given an enema with good results and he was discharged home. Was last admitted on 09/01/15 for right shoulder shoulder and was discharged the following day. Last HF admission was November 2016.  He presents today for a follow-up visit with a mild amount of fatigue and shortness of breath upon exertion. Denies any swelling in his legs/abdomen. Has been weighing himself daily and says that his weight has been stable. Is having a colonoscopy done 2 weeks from now. Continues to smoke daily.  Past Medical History:  Diagnosis Date  . Alcohol abuse   . 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 Asthma   . 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 Chronic anemia   . Chronic systolic CHF (congestive heart failure) ([place])    a. EF 20-25% by echo 12/28/10; b improved to 45-50% on echo in June 2015  . 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 Coronary artery disease    Cardiac catheterization in November 2016 showed mild 30% proximal LAD stenosis with no evidence of obstructive disease.  . Diabetes mellitus, type 2 ([place])    pt reports his DM is gone  . GI bleed     15 years ago  . Gout   . Hemoptysis    secondary to pulmonary edema  . Hyperlipidemia   . Hypertension   . Nonischemic cardiomyopathy ([place])    minimal coronary disease by cath 12/29/10  . Osteoarthritis   . Pneumonia   . Tobacco abuse   . Vision loss    peripherial vision only left eye.[place]  artery occusion    Past Surgical History:  Procedure Laterality Date  . CARDIAC CATHETERIZATION    . CARDIAC CATHETERIZATION N/A 01/28/2015   Procedure: Left Heart Cath and Coronary Angiography;  Surgeon: Wellington Hampshire, MD;  Location: [facility];  Service: Cardiovascular;  Laterality: N/A;  . COLONOSCOPY    . HIP ARTHROPLASTY Right 03/15/2013   Procedure: ARTHROPLASTY BIPOLAR HIP;  Surgeon: [name], MD;  Location: [facility];  Service: Orthopedics;  Laterality: Right;  . TOTAL SHOULDER ARTHROPLASTY Right 09/01/2015  . TOTAL SHOULDER ARTHROPLASTY Right 09/01/2015   Procedure: RIGHT TOTAL SHOULDER ARTHROPLASTY;  Surgeon: [name], MD;  Location: [facility];  Service: Orthopedics;  Laterality: Right;   Family History  Problem Relation Age of Onset  . Diabetes Mother   . Hypertension Mother   . Diabetes Father   . Hypertension Father   . Cancer Neg Hx   . Heart disease Neg Hx   . Stroke Neg Hx    Social History  Substance  Use Topics  . Smoking status: Current Every Day Smoker    Packs/day: 0.50    Years: 30.00    Types: Cigarettes  . Smokeless tobacco: Never Used  . Alcohol use 0.6 oz/week    1 Glasses of wine per week     Comment: occasionally   Allergies  Allergen Reactions  . Tramadol Itching and Rash   Prior to Admission medications   Medication Sig Start Date End Date Taking? Authorizing Provider  albuterol (PROVENTIL HFA;VENTOLIN HFA) 108 (90 BASE) MCG/ACT inhaler Inhale 2 puffs into the lungs every 6 (six) hours as needed for wheezing or shortness of breath. Take 2 puffs every 5-10 minutes up to 6 puffs total over 15 minutes when needed.  Use  with a spacer. Patient taking differently: Inhale 2 puffs into the lungs every 6 (six) hours as needed for wheezing or shortness of breath. Take 2 puffs up to 6 puffs total over 15 minutes when needed.  Use with a spacer. 01/24/15  Yes Ahmed Prima, MD  allopurinol (ZYLOPRIM) 100 MG tablet Take 100 mg by mouth daily.   Yes Historical Provider, MD  aspirin EC 325 MG tablet Take 162.5 mg by mouth daily.   Yes Historical Provider, MD  carvedilol (COREG) 12.5 MG tablet Take 1 tablet (12.5 mg total) by mouth 2 (two) times daily with a meal. 10/20/15  Yes Alisa Graff, FNP  cetirizine (ZYRTEC) 10 MG tablet Take 1 tablet (10 mg total) by mouth daily. 06/07/16  Yes Alisa Graff, FNP  fenofibrate (TRICOR) 145 MG tablet Take 1 tablet (145 mg total) by mouth daily. 05/16/16  Yes [name], NP  fluticasone (FLONASE) 50 MCG/ACT nasal spray Place 1 spray into both nostrils 2 (two) times daily. 03/29/16  Yes Roderic Palau D Cuthriell, PA-C  folic acid (FOLVITE) 1 MG tablet TAKE 1 TABLET BY MOUTH ONCE A DAY 04/30/16  Yes Amy D Clegg, NP  Multiple Vitamin (MULTIVITAMIN WITH MINERALS) TABS tablet Take 1 tablet by mouth daily. Men's One a Day   Yes Historical Provider, MD  naproxen (NAPROSYN) 500 MG tablet Take 500 mg by mouth 2 (two) times daily as needed for moderate pain (pain).    Yes Historical Provider, MD  Omega-3 Fatty Acids (FISH OIL PO) Take 1 capsule by mouth daily.   Yes Historical Provider, MD  sacubitril-valsartan (ENTRESTO) 49-51 MG Take 1 tablet by mouth 2 (two) times daily. 02/27/16  Yes Alisa Graff, FNP  spironolactone (ALDACTONE) 25 MG tablet Take 0.5 tablets (12.5 mg total) by mouth daily. 12/01/15  Yes Alisa Graff, FNP  triamcinolone cream (KENALOG) 0.1 % Apply 1 application topically 2 (two) times daily. 05/09/16  Yes [name], NP    Review of Systems  Constitutional: Positive for fatigue. Negative for appetite change.  HENT: Positive for postnasal drip. Negative for congestion and  sore throat.   Eyes: Negative.   Respiratory: Positive for shortness of breath. Negative for cough and chest tightness.   Cardiovascular: Negative for chest pain, palpitations and leg swelling.  Gastrointestinal: Negative for abdominal distention and abdominal pain.  Endocrine: Negative.   Genitourinary: Negative.   Musculoskeletal: Negative for back pain and neck pain.  Skin: Negative.   Allergic/Immunologic: Negative.   Neurological: Positive for headaches. Negative for dizziness and light-headedness.  Hematological: Negative for adenopathy. Does not bruise/bleed easily.  Psychiatric/Behavioral: Negative for dysphoric mood, sleep disturbance (sleepingon 1 pillow) and suicidal ideas. The patient is not nervous/anxious.    Vitals:  06/07/16 1010  BP: (!) 147/92  Pulse: 86  Resp: 18  SpO2: 100%  Weight: 190 lb 2 oz (86.2 kg)  Height: 6\' 4"  (1.93 m)   Wt Readings from Last 3 Encounters:  06/07/16 190 lb 2 oz (86.2 kg)  05/09/16 191 lb (86.6 kg)  04/09/16 187 lb (84.8 kg)   Lab Results  Component Value Date   CREATININE 1.25 05/09/2016   CREATININE 1.42 (H) 04/09/2016   CREATININE 1.21 09/06/2015   Physical Exam  Constitutional: He is oriented to person, place, and time. He appears well-developed and well-nourished.  HENT:  Head: Normocephalic and atraumatic.  Eyes: Conjunctivae are normal. Pupils are equal, round, and reactive to light.  Neck: Normal range of motion. Neck supple. No JVD present.  Cardiovascular: Normal rate and regular rhythm.   Pulmonary/Chest: Effort normal. He has no wheezes. He has no rales.  Abdominal: Soft. He exhibits no distension. There is no tenderness.  Musculoskeletal: He exhibits no edema or tenderness.  Neurological: He is alert and oriented to person, place, and time.  Skin: Skin is warm and dry.  Psychiatric: He has a normal mood and affect. His behavior is normal. Thought content normal.  Nursing note and vitals reviewed.     Assessment & Plan:  1: Chronic heart failure with reduced ejection fraction- - NYHA class II - euvolemic - continue weighing daily and call for an overnight weight gain of >2 pounds/weekly weight gain of >5 pounds.  - not adding any salt to his food - discussed increasing his entresto or carvedilol at his next visit. Will see what his EF is upon repeat before adjusting medications.  - sees cardiologist Fletcher Anon) later today. Needs to get echo repeated as last one was done November 2016. - received his flu vaccine for the season  2: HTN- - BP ok today - saw his PCP a couple of weeks ago - he says that he hasn't missed any of his medications  3: Tobacco use- - continues to smoke about 1 ppd of cigarettes and said he smoked right before coming to the office - reports never quitting before and doesn't desire to quit at this time - complete cessation discussed for 3 minutes with him  Medication bottles were reviewed.  Return in 2 months or sooner for any questions/problems before then.

## 2016-06-07 NOTE — Progress Notes (Signed)
Cardiology Office Note   Date:  06/07/2016   ID:  Travis [name], DOB [birth year], MRN [id number]  PCP:  [name], NP  Cardiologist:   Kathlyn Sacramento, MD   Chief Complaint  Patient presents with  . other    6 month follow up. Patient states he is doing well. Meds reviewed verbally with patient.       History of Present Illness: Travis [name] is a 61 y.o. 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 He has known history of mild nonobstructive one-vessel coronary artery disease, history of TIA, tobacco use, previous excessive alcohol use and hyperlipidemia. Most recent echocardiogram in November 2016 showed worsening LV systolic dysfunction with an ejection fraction of 25-30%. Due to that, he underwent cardiac catheterization which showed mild nonobstructive one-vessel coronary artery disease. LVEDP was mildly elevated.  He decreased alcohol intake significantly and has been taking his medications regularly. He is doing well with no chest pain or shortness of breath. No palpitations or syncope. He is following regularly at the heart failure clinic.    Past Medical History:  Diagnosis Date  . Alcohol abuse   . 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 Asthma   . 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 Chronic anemia   . Chronic systolic CHF (congestive heart failure) ([place])    a. EF 20-25% by echo 12/28/10; b improved to 45-50% on echo in June 2015  . 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 Coronary artery disease    Cardiac catheterization in November 2016 showed mild 30% proximal LAD stenosis with no evidence of obstructive disease.  . Diabetes mellitus, type 2 ([place])    pt reports his DM is gone  . GI bleed    15 years ago  . Gout   . Hemoptysis    secondary to pulmonary edema  . Hyperlipidemia   . Hypertension     . Nonischemic cardiomyopathy ([place])    minimal coronary disease by cath 12/29/10  . Osteoarthritis   . Pneumonia   . Tobacco abuse   . Vision loss    peripherial vision only left eye.[facility]ra Retinla  artery occusion     Past Surgical History:  Procedure Laterality Date  . CARDIAC CATHETERIZATION    . CARDIAC CATHETERIZATION N/A 01/28/2015   Procedure: Left Heart Cath and Coronary Angiography;  Surgeon: Travis Hampshire, MD;  Location: [facility];  Service: Cardiovascular;  Laterality: N/A;  . COLONOSCOPY    . HIP ARTHROPLASTY Right 03/15/2013   Procedure: ARTHROPLASTY BIPOLAR HIP;  Surgeon: Travis Rossetti, MD;  Location: [facility];  Service: Orthopedics;  Laterality: Right;  . TOTAL SHOULDER ARTHROPLASTY Right 09/01/2015  . TOTAL SHOULDER ARTHROPLASTY Right 09/01/2015   Procedure: RIGHT TOTAL SHOULDER ARTHROPLASTY;  Surgeon: Travis Britain, MD;  Location: [facility];  Service: Orthopedics;  Laterality: Right;     Current Outpatient Prescriptions  Medication Sig Dispense Refill  . albuterol (PROVENTIL HFA;VENTOLIN HFA) 108 (90 BASE) MCG/ACT inhaler Inhale 2 puffs into the lungs every 6 (six) hours as needed for wheezing or shortness of breath. Take 2 puffs every 5-10 minutes up to 6 puffs total over 15 minutes when needed.  Use with a spacer. (Patient taking differently: Inhale 2 puffs into the lungs every 6 (six) hours as needed for wheezing or shortness of breath. Take 2 puffs up to  6 puffs total over 15 minutes when needed.  Use with a spacer.) 1 Inhaler 2  . allopurinol (ZYLOPRIM) 100 MG tablet Take 100 mg by mouth daily.    [name] aspirin EC 325 MG tablet Take 162.5 mg by mouth daily.    . carvedilol (COREG) 12.5 MG tablet Take 1 tablet (12.5 mg total) by mouth 2 (two) times daily with a meal. 60 tablet 5  . cetirizine (ZYRTEC) 10 MG tablet Take 1 tablet (10 mg total) by mouth daily. 30 tablet 11  . fenofibrate (TRICOR) 145 MG tablet Take 1 tablet (145 mg total) by mouth daily. 30  tablet 2  . fluticasone (FLONASE) 50 MCG/ACT nasal spray Place 1 spray into both nostrils 2 (two) times daily. 16 g 0  . folic acid (FOLVITE) 1 MG tablet TAKE 1 TABLET BY MOUTH ONCE A DAY 30 tablet 3  . Multiple Vitamin (MULTIVITAMIN WITH MINERALS) TABS tablet Take 1 tablet by mouth daily. Men's One a Day    . naproxen (NAPROSYN) 500 MG tablet Take 500 mg by mouth 2 (two) times daily as needed for moderate pain (pain).     . Omega-3 Fatty Acids (FISH OIL PO) Take 1 capsule by mouth daily.    . sacubitril-valsartan (ENTRESTO) 49-51 MG Take 1 tablet by mouth 2 (two) times daily. 60 tablet 5  . spironolactone (ALDACTONE) 25 MG tablet Take 0.5 tablets (12.5 mg total) by mouth daily. 30 tablet 3  . triamcinolone cream (KENALOG) 0.1 % Apply 1 application topically 2 (two) times daily. 30 g 0   No current facility-administered medications for this visit.     Allergies:   Tramadol    Social History:  The patient  reports that he has been smoking Cigarettes.  He has a 15.00 pack-year smoking history. He has never used smokeless tobacco. He reports that he drinks about 0.6 oz of alcohol per week . He reports that he uses drugs, including Marijuana.   Family History:  The patient's family history includes Diabetes in his father and mother; Hypertension in his father and mother.    ROS:  Please see the history of present illness.   Otherwise, review of systems are positive for none.   All other systems are reviewed and negative.    PHYSICAL EXAM: VS:  BP 140/82 (BP Location: Left Arm, Patient Position: Sitting, Cuff Size: Normal)   Pulse 80   Ht 6\' 4"  (1.93 m)   Wt 191 lb (86.6 kg)   BMI 23.25 kg/m  , BMI Body mass index is 23.25 kg/m. GEN: Well nourished, well developed, in no acute distress  HEENT: normal  Neck: no JVD, carotid bruits, or masses Cardiac: RRR; no murmurs, rubs, or gallops,no edema  Respiratory:  clear to auscultation bilaterally, normal work of breathing GI: soft,  nontender, nondistended, + BS MS: no deformity or atrophy  Skin: warm and dry, no rash Neuro:  Strength and sensation are intact Psych: euthymic mood, full affect   EKG:  EKG is  ordered today. EKG showed normal sinus rhythm with left axis deviation and inferolateral ST and T wave changes suggestive of ischemia.    Recent Labs: 05/09/2016: ALT 19; BUN 19; Creatinine, Ser 1.25; Hemoglobin 13.2; Platelets 225.0; Potassium 4.5; Sodium 139    Lipid Panel    Component Value Date/Time   CHOL 205 (H) 05/09/2016 1453   TRIG (H) 05/09/2016 1453    649.0 Triglyceride is over 400; calculations on Lipids are invalid.   HDL 51.50 05/09/2016  1453   CHOLHDL 4 05/09/2016 1453   VLDL 44.2 (H) 12/31/2013 1403   LDLCALC UNABLE TO CALCULATE IF TRIGLYCERIDE OVER 400 mg/dL 09/14/2013 0510   LDLDIRECT 63.0 05/09/2016 1453      Wt Readings from Last 3 Encounters:  06/07/16 191 lb (86.6 kg)  06/07/16 190 lb 2 oz (86.2 kg)  05/09/16 191 lb (86.6 kg)       ASSESSMENT AND PLAN: MN  1.   Chronic systolic heart failure: Due to suspected alcohol-induced cardiomyopathy.  He is on optimal medical therapy and appears to be euvolemic. Currently New York Heart Association class II. I requested an echocardiogram to reevaluate his ejection fraction. If EF is less than 35%, recommend referral to EP to consider ICD placement.  2. Essential hypertension: Blood pressure has been slightly elevated on recent visits. Consider increasing the dose of carvedilol or Entresto.   3. Tobacco use: He continues to smoke and has not been able to quit smoking.     Disposition:   FU with me in 6 months  Signed,  Kathlyn Sacramento, MD  06/07/2016 3:21 PM    [place]

## 2016-07-03 ENCOUNTER — Ambulatory Visit (AMBULATORY_SURGERY_CENTER): Payer: Self-pay

## 2016-07-03 VITALS — Ht 76.0 in | Wt 187.8 lb

## 2016-07-03 DIAGNOSIS — Z1211 Encounter for screening for malignant neoplasm of colon: Secondary | ICD-10-CM

## 2016-07-03 MED ORDER — NA SULFATE-K SULFATE-MG SULF 17.5-3.13-1.6 GM/177ML PO SOLN: 1.0000 | Freq: Once | ORAL | 0 refills | Status: AC

## 2016-07-03 NOTE — Progress Notes (Signed)
Denies allergies to eggs or soy products. Denies complication of anesthesia or sedation. Denies use of weight loss medication. Denies use of O2.   Emmi instructions not given. Patient does not have email.

## 2016-07-10 ENCOUNTER — Other Ambulatory Visit: Payer: Medicare Other

## 2016-07-11 ENCOUNTER — Telehealth: Payer: Self-pay

## 2016-07-11 NOTE — Telephone Encounter (Signed)
Patient was contacted and rescheduled on 08/22/16 @ 2:00 Pm to see Dr. Havery Moros in the clinic prior to having a colonoscopy.

## 2016-07-11 NOTE — Telephone Encounter (Signed)
  Travis [name], Travis [name] Male, 61 y.o., 23-Oct-1955 Weight:  187 lb 12.8 oz (85.2 kg) Phone:  [phone] PCP:  [name], NP [place] MRN:  [id number] MyChart:  Code Exp Next Appt:  08/07/2016   RE: ASA IV pt  Received: Today  Message Contents  Manus Gunning, MD  Osvaldo Angst, CRNA  Cc: Riki Sheer, LPN        Thanks Jenny Reichmann for letting me know.   Izora Gala can you please cancel this patient's case and book him a clinic visit with me? It looks like he has a repeat echo scheduled in May, we will need to see what that shows. Thanks   Previous Messages    ----- Message -----  From: Osvaldo Angst, CRNA  Sent: 07/10/2016  6:21 PM  To: Riki Sheer, LPN, *  Subject: ASA IV pt                     Doc,   This gentleman is scheduled with you for a screening colonoscopy on April 24th. Unfortunately he has cardiomyopathy and his last documented EF is <35%. Consequently, he does not qualify for care at [facility].   Thanks,   Osvaldo Angst

## 2016-07-11 NOTE — Telephone Encounter (Signed)
-----   Message from Manus Gunning, MD sent at 07/11/2016  7:22 AM EDT ----- Regarding: RE: ASA IV pt Thanks John for letting me know.  Izora Gala can you please cancel this patient's case and book him a clinic visit with me? It looks like he has a repeat echo scheduled in May, we will need to see what that shows. Thanks   ----- Message ----- From: Osvaldo Angst, CRNA Sent: 07/10/2016   6:21 PM To: Riki Sheer, LPN, # Subject: ASA IV pt                                      Doc,  This gentleman is scheduled with you for a screening colonoscopy on April 24th.  Unfortunately he has cardiomyopathy and his last documented EF is <35%.  Consequently, he does not qualify for care at [facility].  Thanks,  Osvaldo Angst

## 2016-07-13 ENCOUNTER — Other Ambulatory Visit: Payer: Self-pay | Admitting: Internal Medicine

## 2016-07-13 DIAGNOSIS — M10041 Idiopathic gout, right hand: Secondary | ICD-10-CM

## 2016-07-16 ENCOUNTER — Other Ambulatory Visit: Payer: Self-pay | Admitting: Family

## 2016-07-16 MED ORDER — CARVEDILOL 12.5 MG PO TABS: 12.5000 mg | ORAL_TABLET | Freq: Two times a day (BID) | ORAL | 5 refills | Status: DC

## 2016-07-17 ENCOUNTER — Encounter: Payer: Medicare Other | Admitting: Gastroenterology

## 2016-08-07 ENCOUNTER — Ambulatory Visit: Payer: Medicare Other | Attending: Family | Admitting: Family

## 2016-08-07 ENCOUNTER — Encounter: Payer: Self-pay | Admitting: Family

## 2016-08-07 VITALS — BP 113/76 | HR 82 | Resp 20 | Ht 76.0 in | Wt 178.0 lb

## 2016-08-07 DIAGNOSIS — M1990 Unspecified osteoarthritis, unspecified site: Secondary | ICD-10-CM | POA: Insufficient documentation

## 2016-08-07 DIAGNOSIS — I2510 Atherosclerotic heart disease of native coronary artery without angina pectoris: Secondary | ICD-10-CM | POA: Diagnosis not present

## 2016-08-07 DIAGNOSIS — I426 Alcoholic cardiomyopathy: Secondary | ICD-10-CM | POA: Insufficient documentation

## 2016-08-07 DIAGNOSIS — R42 Dizziness and giddiness: Secondary | ICD-10-CM | POA: Insufficient documentation

## 2016-08-07 DIAGNOSIS — J45909 Unspecified asthma, uncomplicated: Secondary | ICD-10-CM | POA: Insufficient documentation

## 2016-08-07 DIAGNOSIS — Z7982 Long term (current) use of aspirin: Secondary | ICD-10-CM | POA: Insufficient documentation

## 2016-08-07 DIAGNOSIS — R042 Hemoptysis: Secondary | ICD-10-CM | POA: Diagnosis not present

## 2016-08-07 DIAGNOSIS — D649 Anemia, unspecified: Secondary | ICD-10-CM | POA: Diagnosis not present

## 2016-08-07 DIAGNOSIS — E785 Hyperlipidemia, unspecified: Secondary | ICD-10-CM | POA: Diagnosis not present

## 2016-08-07 DIAGNOSIS — E119 Type 2 diabetes mellitus without complications: Secondary | ICD-10-CM | POA: Insufficient documentation

## 2016-08-07 DIAGNOSIS — Z7951 Long term (current) use of inhaled steroids: Secondary | ICD-10-CM | POA: Diagnosis not present

## 2016-08-07 DIAGNOSIS — I110 Hypertensive heart disease with heart failure: Secondary | ICD-10-CM | POA: Insufficient documentation

## 2016-08-07 DIAGNOSIS — Z720 Tobacco use: Secondary | ICD-10-CM

## 2016-08-07 DIAGNOSIS — F17210 Nicotine dependence, cigarettes, uncomplicated: Secondary | ICD-10-CM | POA: Diagnosis not present

## 2016-08-07 DIAGNOSIS — I5022 Chronic systolic (congestive) heart failure: Secondary | ICD-10-CM | POA: Diagnosis not present

## 2016-08-07 DIAGNOSIS — I10 Essential (primary) hypertension: Secondary | ICD-10-CM

## 2016-08-07 DIAGNOSIS — M109 Gout, unspecified: Secondary | ICD-10-CM | POA: Insufficient documentation

## 2016-08-07 NOTE — Patient Instructions (Signed)
Continue weighing daily and call for an overnight weight gain of > 2 pounds or a weekly weight gain of >5 pounds.    Smoking Cessation Quitting smoking is important to your health and has many advantages. However, it is not always easy to quit since nicotine is a very addictive drug. Oftentimes, people try 3 times or more before being able to quit. This document explains the best ways for you to prepare to quit smoking. Quitting takes hard work and a lot of effort, but you can do it. ADVANTAGES OF QUITTING SMOKING  You will live longer, feel better, and live better.  Your body will feel the impact of quitting smoking almost immediately.  Within 20 minutes, blood pressure decreases. Your pulse returns to its normal level.  After 8 hours, carbon monoxide levels in the blood return to normal. Your oxygen level increases.  After 24 hours, the chance of having a heart attack starts to decrease. Your breath, hair, and body stop smelling like smoke.  After 48 hours, damaged nerve endings begin to recover. Your sense of taste and smell improve.  After 72 hours, the body is virtually free of nicotine. Your bronchial tubes relax and breathing becomes easier.  After 2 to 12 weeks, lungs can hold more air. Exercise becomes easier and circulation improves.  The risk of having a heart attack, stroke, cancer, or lung disease is greatly reduced.  After 1 year, the risk of coronary heart disease is cut in half.  After 5 years, the risk of stroke falls to the same as a nonsmoker.  After 10 years, the risk of lung cancer is cut in half and the risk of other cancers decreases significantly.  After 15 years, the risk of coronary heart disease drops, usually to the level of a nonsmoker.  If you are pregnant, quitting smoking will improve your chances of having a healthy baby.  The people you live with, especially any children, will be healthier.  You will have extra money to spend on things other  than cigarettes. QUESTIONS TO THINK ABOUT BEFORE ATTEMPTING TO QUIT You may want to talk about your answers with your health care provider.  Why do you want to quit?  If you tried to quit in the past, what helped and what did not?  What will be the most difficult situations for you after you quit? How will you plan to handle them?  Who can help you through the tough times? Your family? Friends? A health care provider?  What pleasures do you get from smoking? What ways can you still get pleasure if you quit? Here are some questions to ask your health care provider:  How can you help me to be successful at quitting?  What medicine do you think would be best for me and how should I take it?  What should I do if I need more help?  What is smoking withdrawal like? How can I get information on withdrawal? GET READY  Set a quit date.  Change your environment by getting rid of all cigarettes, ashtrays, matches, and lighters in your home, car, or work. Do not let people smoke in your home.  Review your past attempts to quit. Think about what worked and what did not. GET SUPPORT AND ENCOURAGEMENT You have a better chance of being successful if you have help. You can get support in many ways.  Tell your family, friends, and coworkers that you are going to quit and need their support. Ask   them not to smoke around you.  Get individual, group, or telephone counseling and support. Programs are available at local hospitals and health centers. Call your local health department for information about programs in your area.  Spiritual beliefs and practices may help some smokers quit.  Download a "quit meter" on your computer to keep track of quit statistics, such as how long you have gone without smoking, cigarettes not smoked, and money saved.  Get a self-help book about quitting smoking and staying off tobacco. LEARN NEW SKILLS AND BEHAVIORS  Distract yourself from urges to smoke. Talk to  someone, go for a walk, or occupy your time with a task.  Change your normal routine. Take a different route to work. Drink tea instead of coffee. Eat breakfast in a different place.  Reduce your stress. Take a hot bath, exercise, or read a book.  Plan something enjoyable to do every day. Reward yourself for not smoking.  Explore interactive web-based programs that specialize in helping you quit. GET MEDICINE AND USE IT CORRECTLY Medicines can help you stop smoking and decrease the urge to smoke. Combining medicine with the above behavioral methods and support can greatly increase your chances of successfully quitting smoking.  Nicotine replacement therapy helps deliver nicotine to your body without the negative effects and risks of smoking. Nicotine replacement therapy includes nicotine gum, lozenges, inhalers, nasal sprays, and skin patches. Some may be available over-the-counter and others require a prescription.  Antidepressant medicine helps people abstain from smoking, but how this works is unknown. This medicine is available by prescription.  Nicotinic receptor partial agonist medicine simulates the effect of nicotine in your brain. This medicine is available by prescription. Ask your health care provider for advice about which medicines to use and how to use them based on your health history. Your health care provider will tell you what side effects to look out for if you choose to be on a medicine or therapy. Carefully read the information on the package. Do not use any other product containing nicotine while using a nicotine replacement product.  RELAPSE OR DIFFICULT SITUATIONS Most relapses occur within the first 3 months after quitting. Do not be discouraged if you start smoking again. Remember, most people try several times before finally quitting. You may have symptoms of withdrawal because your body is used to nicotine. You may crave cigarettes, be irritable, feel very hungry, cough  often, get headaches, or have difficulty concentrating. The withdrawal symptoms are only temporary. They are strongest when you first quit, but they will go away within 10-14 days. To reduce the chances of relapse, try to:  Avoid drinking alcohol. Drinking lowers your chances of successfully quitting.  Reduce the amount of caffeine you consume. Once you quit smoking, the amount of caffeine in your body increases and can give you symptoms, such as a rapid heartbeat, sweating, and anxiety.  Avoid smokers because they can make you want to smoke.  Do not let weight gain distract you. Many smokers will gain weight when they quit, usually less than 10 pounds. Eat a healthy diet and stay active. You can always lose the weight gained after you quit.  Find ways to improve your mood other than smoking. FOR MORE INFORMATION  www.smokefree.gov  Document Released: 03/06/2001 Document Revised: 07/27/2013 Document Reviewed: 06/21/2011 ExitCare Patient Information 2015 [employer]. This information is not intended to replace advice given to you by your health care provider. Make sure you discuss any questions you have with your   health care provider.  

## 2016-08-07 NOTE — Progress Notes (Signed)
Patient ID: Travis [name], male    DOB: 18-Sep-1955, 61 y.o.   MRN: [id number]  HPI  [name] is a 61 y/o male with a history of osteoarthritis, hyperlipidemia, gout, HTN, GI bleed, DM, CAD, anemia, asthma, previous alcohol use, current tobacco use and chronic heart failure.  Last echo was done 01/26/15 and showed an EF of 25-30% along with trivial MR/ TR. EF has dropped from 2015 when it was 45-50%. Had a cardiac catheterization done 01/28/15 which showed 10% stenosis in Prox RCA to Mid RCA and 30% stenosis in Prox LAD to Mid LAD which is not much different from previous angiography. Cardiomyopathy felt to be due to previous alcohol use.   Was in the ED on 03/29/16 due to acute frontal sinusitis. Was treated and released. Previously was in the ED on 09/06/15 for constipation after surgery. Was given an enema with good results and he was discharged home. Was last admitted on 09/01/15 for right shoulder shoulder and was discharged the following day. Last HF admission was November 2016.  He presents today for a follow-up visit with a chief complaint of light-headedness. He describes this as occurring intermittently with position changes and especially in the hot weather. He says that this has been occurring for a few days. Denies any associated symptoms such as blurry vision, chest pain or headaches.   Past Medical History:  Diagnosis Date  . Alcohol abuse   . 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 Allergy   . Asthma   . 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 Chronic anemia   . Chronic systolic CHF (congestive heart failure) ([place])    a. EF 20-25% by echo 12/28/10; b improved to 45-50% on echo in June 2015  . 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 Clotting disorder ([place])   . Coronary artery disease    Cardiac catheterization in November 2016 showed mild 30% proximal LAD stenosis with no evidence of obstructive disease.  . Diabetes  mellitus, type 2 ([place])    pt reports his DM is gone  . GI bleed    15 years ago  . Gout   . Hemoptysis    secondary to pulmonary edema  . Hyperlipidemia   . Hypertension   . Nonischemic cardiomyopathy ([place])    minimal coronary disease by cath 12/29/10  . Osteoarthritis   . Pneumonia   . Tobacco abuse   . Vision loss    peripherial vision only left eye.Centra Retinla  artery occusion    Past Surgical History:  Procedure Laterality Date  . CARDIAC CATHETERIZATION    . CARDIAC CATHETERIZATION N/A 01/28/2015   Procedure: Left Heart Cath and Coronary Angiography;  Surgeon: Wellington Hampshire, MD;  Location: [facility];  Service: Cardiovascular;  Laterality: N/A;  . COLONOSCOPY    . HIP ARTHROPLASTY Right 03/15/2013   Procedure: ARTHROPLASTY BIPOLAR HIP;  Surgeon: [name], MD;  Location: [facility];  Service: Orthopedics;  Laterality: Right;  . TOTAL SHOULDER ARTHROPLASTY Right 09/01/2015  . TOTAL SHOULDER ARTHROPLASTY Right 09/01/2015   Procedure: RIGHT TOTAL SHOULDER ARTHROPLASTY;  Surgeon: [name], MD;  Location: [facility];  Service: Orthopedics;  Laterality: Right;   Family History  Problem Relation Age of Onset  . Diabetes Mother   . Hypertension Mother   . Diabetes Father   . Hypertension Father   . Cancer Neg Hx   . Heart disease Neg Hx   .  Stroke Neg Hx   . Colon cancer Neg Hx   . Esophageal cancer Neg Hx   . Rectal cancer Neg Hx   . Stomach cancer Neg Hx    Social History  Substance Use Topics  . Smoking status: Current Every Day Smoker    Packs/day: 0.50    Years: 30.00    Types: Cigarettes  . Smokeless tobacco: Never Used  . Alcohol use 0.6 oz/week    1 Glasses of wine per week     Comment: occasionally   Allergies  Allergen Reactions  . Tramadol Itching and Rash   Prior to Admission medications   Medication Sig Start Date End Date Taking? Authorizing Provider  albuterol (PROVENTIL HFA;VENTOLIN HFA) 108 (90 BASE) MCG/ACT inhaler Inhale 2  puffs into the lungs every 6 (six) hours as needed for wheezing or shortness of breath. Take 2 puffs every 5-10 minutes up to 6 puffs total over 15 minutes when needed.  Use with a spacer. Patient taking differently: Inhale 2 puffs into the lungs every 6 (six) hours as needed for wheezing or shortness of breath. Take 2 puffs up to 6 puffs total over 15 minutes when needed.  Use with a spacer. 01/24/15  Yes Ahmed Prima, MD  allopurinol (ZYLOPRIM) 100 MG tablet Take 100 mg by mouth daily.   Yes Historical Provider, MD  aspirin EC 325 MG tablet Take 162.5 mg by mouth daily.   Yes Historical Provider, MD  carvedilol (COREG) 12.5 MG tablet Take 1 tablet (12.5 mg total) by mouth 2 (two) times daily with a meal. 10/20/15  Yes Alisa Graff, FNP  cetirizine (ZYRTEC) 10 MG tablet Take 1 tablet (10 mg total) by mouth daily. 06/07/16  Yes Alisa Graff, FNP  fenofibrate (TRICOR) 145 MG tablet Take 1 tablet (145 mg total) by mouth daily. 05/16/16  Yes [name], NP  fluticasone (FLONASE) 50 MCG/ACT nasal spray Place 1 spray into both nostrils 2 (two) times daily. 03/29/16  Yes Roderic Palau D Cuthriell, PA-C  folic acid (FOLVITE) 1 MG tablet TAKE 1 TABLET BY MOUTH ONCE A DAY 04/30/16  Yes Amy D Clegg, NP  Multiple Vitamin (MULTIVITAMIN WITH MINERALS) TABS tablet Take 1 tablet by mouth daily. Men's One a Day   Yes Historical Provider, MD  naproxen (NAPROSYN) 500 MG tablet Take 500 mg by mouth 2 (two) times daily as needed for moderate pain (pain).    Yes Historical Provider, MD  Omega-3 Fatty Acids (FISH OIL PO) Take 1 capsule by mouth daily.   Yes Historical Provider, MD  sacubitril-valsartan (ENTRESTO) 49-51 MG Take 1 tablet by mouth 2 (two) times daily. 02/27/16  Yes Alisa Graff, FNP  spironolactone (ALDACTONE) 25 MG tablet Take 0.5 tablets (12.5 mg total) by mouth daily. 12/01/15  Yes Alisa Graff, FNP  triamcinolone cream (KENALOG) 0.1 % Apply 1 application topically 2 (two) times daily. 05/09/16  Yes  [name], NP    Review of Systems  Constitutional: Negative for appetite change and fatigue.  HENT: Negative for congestion, postnasal drip and sore throat.   Eyes: Negative.   Respiratory: Negative for cough and chest tightness.   Cardiovascular: Negative for chest pain, palpitations and leg swelling.  Gastrointestinal: Negative for abdominal distention and abdominal pain.  Endocrine: Negative.   Genitourinary: Negative.   Musculoskeletal: Negative for back pain and neck pain.  Skin: Negative.   Allergic/Immunologic: Negative.   Neurological: Positive for light-headedness (with position changes in the hot weather). Negative  for dizziness and headaches.  Hematological: Negative for adenopathy. Does not bruise/bleed easily.  Psychiatric/Behavioral: Negative for dysphoric mood, sleep disturbance (sleepingon 1 pillow) and suicidal ideas. The patient is not nervous/anxious.    Vitals:   08/07/16 0906  BP: 113/76  Pulse: 82  Resp: 20  SpO2: 100%  Weight: 178 lb (80.7 kg)  Height: 6\' 4"  (1.93 m)   Wt Readings from Last 3 Encounters:  08/07/16 178 lb (80.7 kg)  07/03/16 187 lb 12.8 oz (85.2 kg)  06/07/16 191 lb (86.6 kg)   Lab Results  Component Value Date   CREATININE 1.25 05/09/2016   CREATININE 1.42 (H) 04/09/2016   CREATININE 1.21 09/06/2015   Physical Exam  Constitutional: He is oriented to person, place, and time. He appears well-developed and well-nourished.  HENT:  Head: Normocephalic and atraumatic.  Neck: Normal range of motion. Neck supple. No JVD present.  Cardiovascular: Normal rate and regular rhythm.   Pulmonary/Chest: Effort normal. He has no wheezes. He has no rales.  Abdominal: Soft. He exhibits no distension. There is no tenderness.  Musculoskeletal: He exhibits no edema or tenderness.  Neurological: He is alert and oriented to person, place, and time.  Skin: Skin is warm and dry.  Psychiatric: He has a normal mood and affect. His behavior is  normal. Thought content normal.  Nursing note and vitals reviewed.    Assessment & Plan:  1: Chronic heart failure with reduced ejection fraction- - NYHA class I - euvolemic - continue weighing daily and call for an overnight weight gain of >2 pounds/weekly weight gain of >5 pounds. Weight down 12 pounds since he was last here 06/07/16. Reports eating healthier and being more active. - not adding any salt to his food - had discussed increasing his entresto or carvedilol at this visit but he hasn't had his echocardiogram done yet.  - saw cardiologist Fletcher Anon) 06/07/16  - echocardiogram scheduled for 08/08/16  2: HTN- - BP looks good today - saw his PCP Garnette Gunner) 05/09/16  3: Tobacco use- - continues to smoke about 1 ppd of cigarettes  - reports never quitting before and doesn't desire to quit at this time - complete cessation discussed for 3 minutes with him  Patient did not bring his medications nor a list. Each medication was verbally reviewed with the patient and he was encouraged to bring the bottles to every visit to confirm accuracy of list.  Return in 1 month or sooner for any questions/problems before then.

## 2016-08-08 ENCOUNTER — Ambulatory Visit (INDEPENDENT_AMBULATORY_CARE_PROVIDER_SITE_OTHER): Payer: Medicare Other

## 2016-08-08 ENCOUNTER — Other Ambulatory Visit: Payer: Self-pay

## 2016-08-08 DIAGNOSIS — I5022 Chronic systolic (congestive) heart failure: Secondary | ICD-10-CM

## 2016-08-21 ENCOUNTER — Other Ambulatory Visit (INDEPENDENT_AMBULATORY_CARE_PROVIDER_SITE_OTHER): Payer: Medicare Other

## 2016-08-21 DIAGNOSIS — E7800 Pure hypercholesterolemia, unspecified: Secondary | ICD-10-CM | POA: Diagnosis not present

## 2016-08-21 LAB — LIPID PANEL
Cholesterol: 197 mg/dL (ref 0–200)
HDL: 67.4 mg/dL (ref >39.00)
NONHDL: 130.04
Total CHOL/HDL Ratio: 3
Triglycerides: 231 mg/dL — ABNORMAL HIGH (ref 0.0–149.0)
VLDL: 46.2 mg/dL — ABNORMAL HIGH (ref 0.0–40.0)

## 2016-08-21 LAB — LDL CHOLESTEROL, DIRECT: LDL DIRECT: 78 mg/dL

## 2016-08-22 ENCOUNTER — Encounter: Payer: Self-pay | Admitting: Gastroenterology

## 2016-08-22 ENCOUNTER — Ambulatory Visit (INDEPENDENT_AMBULATORY_CARE_PROVIDER_SITE_OTHER): Payer: Medicare Other | Admitting: Gastroenterology

## 2016-08-22 VITALS — BP 124/78 | HR 88 | Ht 72.54 in | Wt 181.0 lb

## 2016-08-22 DIAGNOSIS — Z1211 Encounter for screening for malignant neoplasm of colon: Secondary | ICD-10-CM | POA: Diagnosis not present

## 2016-08-22 DIAGNOSIS — Z8679 Personal history of other diseases of the circulatory system: Secondary | ICD-10-CM | POA: Diagnosis not present

## 2016-08-22 MED ORDER — NA SULFATE-K SULFATE-MG SULF 17.5-3.13-1.6 GM/177ML PO SOLN: 1.0000 | Freq: Once | ORAL | 0 refills | Status: DC

## 2016-08-22 NOTE — Patient Instructions (Addendum)
You have been scheduled for a colonoscopy. Please follow written instructions given to you at your visit today.  Please pick up your prep supplies at the pharmacy within the next 1-3 days. [facility].  If you use inhalers (even only as needed), please bring them with you on the day of your procedure. Your physician has requested that you go to www.startemmi.com and enter the access code given to you at your visit today. This web site gives a general overview about your procedure. However, you should still follow specific instructions given to you by our office regarding your preparation for the procedure. 

## 2016-08-22 NOTE — Progress Notes (Signed)
HPI :  61 y/o male with a history of cardiomyopathy, CAD, tobacco use, here to discuss CRC screening. New patient visit. He was previously booked for a colonoscopy through direct screening program but it got cancelled when it was found that his last EF was < 35%.  He thinks he a very remote colonoscopy, many years ago. He does not know the result or where it was done. No known FH of colon cancer. No blood in the stools. No trouble with bowel habits. No weight loss, stable. Eating well otherwise.   He takes a regular aspirin. He smokes tobacco, about 1/4 PPD.   He had a recent echo - 08/08/2016 EF now 35--40%.    Past Medical History:  Diagnosis Date  . Alcohol abuse   . 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 Allergy   . Asthma   . 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 Chronic anemia   . Chronic systolic CHF (congestive heart failure) ([place])    a. EF 20-25% by echo 12/28/10; b improved to 45-50% on echo in June 2015  . 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 Clotting disorder ([place])   . Coronary artery disease    Cardiac catheterization in November 2016 showed mild 30% proximal LAD stenosis with no evidence of obstructive disease.  . Diabetes mellitus, type 2 ([place])    pt reports his DM is gone  . GI bleed    15 years ago  . Gout   . Hemoptysis    secondary to pulmonary edema  . Hyperlipidemia   . Hypertension   . Nonischemic cardiomyopathy ([place])    minimal coronary disease by cath 12/29/10  . Osteoarthritis   . Pneumonia   . Tobacco abuse   . Vision loss    peripherial vision only left eye.[facility]  artery occusion      Past Surgical History:  Procedure Laterality Date  . CARDIAC CATHETERIZATION    . CARDIAC CATHETERIZATION N/A 01/28/2015   Procedure: Left Heart Cath and Coronary Angiography;  Surgeon: Wellington Hampshire, MD;  Location: [facility];  Service: Cardiovascular;  Laterality:  N/A;  . COLONOSCOPY    . HIP ARTHROPLASTY Right 03/15/2013   Procedure: ARTHROPLASTY BIPOLAR HIP;  Surgeon: [name], MD;  Location: [facility];  Service: Orthopedics;  Laterality: Right;  . TOTAL SHOULDER ARTHROPLASTY Right 09/01/2015   Procedure: RIGHT TOTAL SHOULDER ARTHROPLASTY;  Surgeon: [name], MD;  Location: [facility];  Service: Orthopedics;  Laterality: Right;   Family History  Problem Relation Age of Onset  . Diabetes Mother   . Hypertension Mother   . Diabetes Father   . Hypertension Father   . Diabetes Sister   . Dementia Brother   . Cancer Neg Hx   . Heart disease Neg Hx   . Stroke Neg Hx   . Colon cancer Neg Hx   . Esophageal cancer Neg Hx   . Rectal cancer Neg Hx   . Stomach cancer Neg Hx    Social History  Substance Use Topics  . Smoking status: Current Every Day Smoker    Packs/day: 0.50    Years: 30.00    Types: Cigarettes  . Smokeless tobacco: Never Used  . Alcohol use 0.6 oz/week    1 Glasses of wine per week     Comment: occasionally   Current Outpatient Prescriptions  Medication Sig Dispense Refill  . albuterol (PROVENTIL HFA;VENTOLIN HFA) 108 (  90 BASE) MCG/ACT inhaler Inhale 2 puffs into the lungs every 6 (six) hours as needed for wheezing or shortness of breath. Take 2 puffs every 5-10 minutes up to 6 puffs total over 15 minutes when needed.  Use with a spacer. (Patient taking differently: Inhale 2 puffs into the lungs every 6 (six) hours as needed for wheezing or shortness of breath. Take 2 puffs up to 6 puffs total over 15 minutes when needed.  Use with a spacer.) 1 Inhaler 2  . allopurinol (ZYLOPRIM) 100 MG tablet TAKE 1 TABLET BY MOUTH ONCE A DAY 90 tablet 3  . aspirin EC 325 MG tablet Take 162.5 mg by mouth daily.    . carvedilol (COREG) 12.5 MG tablet Take 1 tablet (12.5 mg total) by mouth 2 (two) times daily with a meal. 60 tablet 5  . cetirizine (ZYRTEC) 10 MG tablet Take 1 tablet (10 mg total) by mouth daily. 30 tablet 11  .  fenofibrate (TRICOR) 145 MG tablet Take 1 tablet (145 mg total) by mouth daily. 30 tablet 2  . fluticasone (FLONASE) 50 MCG/ACT nasal spray Place 1 spray into both nostrils 2 (two) times daily. 16 g 0  . folic acid (FOLVITE) 1 MG tablet TAKE 1 TABLET BY MOUTH ONCE A DAY 30 tablet 3  . Multiple Vitamin (MULTIVITAMIN WITH MINERALS) TABS tablet Take 1 tablet by mouth daily. Men's One a Day    . naproxen (NAPROSYN) 500 MG tablet Take 500 mg by mouth 2 (two) times daily as needed for moderate pain (pain).     . Omega-3 Fatty Acids (FISH OIL PO) Take 1 capsule by mouth daily.    . sacubitril-valsartan (ENTRESTO) 49-51 MG Take 1 tablet by mouth 2 (two) times daily. 60 tablet 5  . spironolactone (ALDACTONE) 25 MG tablet Take 0.5 tablets (12.5 mg total) by mouth daily. 30 tablet 3  . triamcinolone cream (KENALOG) 0.1 % Apply 1 application topically 2 (two) times daily. 30 g 0   No current facility-administered medications for this visit.    Allergies  Allergen Reactions  . Tramadol Itching and Rash     Review of Systems: All systems reviewed and negative except where noted in HPI.   Lab Results  Component Value Date   WBC 4.7 05/09/2016   HGB 13.2 05/09/2016   HCT 39.9 05/09/2016   MCV 95.9 05/09/2016   PLT 225.0 05/09/2016    Lab Results  Component Value Date   CREATININE 1.25 05/09/2016   BUN 19 05/09/2016   NA 139 05/09/2016   K 4.5 05/09/2016   CL 104 05/09/2016   CO2 31 05/09/2016    Lab Results  Component Value Date   ALT 19 05/09/2016   AST 26 05/09/2016   ALKPHOS 64 05/09/2016   BILITOT 0.5 05/09/2016     Physical Exam: BP 124/78 (BP Location: Left Arm, Patient Position: Sitting, Cuff Size: Normal)   Pulse 88   Ht 6' 0.54" (1.842 m) Comment: height measured without shoes  Wt 181 lb (82.1 kg)   BMI 24.18 kg/m  Constitutional: Pleasant,well-developed, male in no acute distress. HEENT: Normocephalic and atraumatic. Conjunctivae are normal. No scleral  icterus. Neck supple.  Cardiovascular: Normal rate, regular rhythm.  Pulmonary/chest: Effort normal and breath sounds normal. No wheezing, rales or rhonchi. Abdominal: Soft, nondistended, nontender. There are no masses palpable. No hepatomegaly. Extremities: no edema Lymphadenopathy: No cervical adenopathy noted. Neurological: Alert and oriented to person place and time. Skin: Skin is warm and dry. No rashes  noted. Psychiatric: Normal mood and affect. Behavior is normal.   ASSESSMENT AND PLAN: 61 year old male with a history of CHF / CAD, here to discuss colon cancer screening, for which he is overdue. I discussed options for screening with him to include optical colonoscopy versus stool based testing, in light of his comorbidities, in case he wished to avoid a colonoscopy, which would be reasonable in his case. Following discussion of the risks and benefits of anesthesia and colonoscopy, his preference was to proceed with this rather than to perform a stool based test. His recent echo shows that his EF is now 35-40%, he qualifies for anesthesia at the [facility], and can proceed as his preference as he denies any cardiopulmonary symptoms. Further recommendations pending the results.  Maalaea Cellar, MD [facility] Pager [phone]

## 2016-08-27 ENCOUNTER — Ambulatory Visit (AMBULATORY_SURGERY_CENTER): Payer: Medicare Other | Admitting: Gastroenterology

## 2016-08-27 ENCOUNTER — Encounter: Payer: Self-pay | Admitting: Gastroenterology

## 2016-08-27 VITALS — BP 132/78 | HR 79 | Temp 97.3°F | Resp 16 | Ht 72.0 in | Wt 181.0 lb

## 2016-08-27 DIAGNOSIS — Z1211 Encounter for screening for malignant neoplasm of colon: Secondary | ICD-10-CM

## 2016-08-27 DIAGNOSIS — Z1212 Encounter for screening for malignant neoplasm of rectum: Secondary | ICD-10-CM

## 2016-08-27 MED ORDER — SODIUM CHLORIDE 0.9 % IV SOLN: 500.0000 mL | INTRAVENOUS | Status: DC

## 2016-08-27 NOTE — Patient Instructions (Signed)
Diverticulosis, hemorrhoids seen today. (handouts given) Repeat colonoscopy in 10 years. Resume current medications. Call us with any questions or concerns. Thank you!   YOU HAD AN ENDOSCOPIC PROCEDURE TODAY AT [facility]:   Refer to the procedure report that was given to you for any specific questions about what was found during the examination.  If the procedure report does not answer your questions, please call your gastroenterologist to clarify.  If you requested that your care partner not be given the details of your procedure findings, then the procedure report has been included in a sealed envelope for you to review at your convenience later.  YOU SHOULD EXPECT: Some feelings of bloating in the abdomen. Passage of more gas than usual.  Walking can help get rid of the air that was put into your GI tract during the procedure and reduce the bloating. If you had a lower endoscopy (such as a colonoscopy or flexible sigmoidoscopy) you may notice spotting of blood in your stool or on the toilet paper. If you underwent a bowel prep for your procedure, you may not have a normal bowel movement for a few days.  Please Note:  You might notice some irritation and congestion in your nose or some drainage.  This is from the oxygen used during your procedure.  There is no need for concern and it should clear up in a day or so.  SYMPTOMS TO REPORT IMMEDIATELY:   Following lower endoscopy (colonoscopy or flexible sigmoidoscopy):  Excessive amounts of blood in the stool  Significant tenderness or worsening of abdominal pains  Swelling of the abdomen that is new, acute  Fever of 100F or higher   For urgent or emergent issues, a gastroenterologist can be reached at any hour by calling [phone].   DIET:  We do recommend a small meal at first, but then you may proceed to your regular diet.  Drink plenty of fluids but you should avoid alcoholic beverages for 24 hours.  ACTIVITY:   You should plan to take it easy for the rest of today and you should NOT DRIVE or use heavy machinery until tomorrow (because of the sedation medicines used during the test).    FOLLOW UP: Our staff will call the number listed on your records the next business day following your procedure to check on you and address any questions or concerns that you may have regarding the information given to you following your procedure. If we do not reach you, we will leave a message.  However, if you are feeling well and you are not experiencing any problems, there is no need to return our call.  We will assume that you have returned to your regular daily activities without incident.  If any biopsies were taken you will be contacted by phone or by letter within the next 1-3 weeks.  Please call us at [phone] if you have not heard about the biopsies in 3 weeks.    SIGNATURES/CONFIDENTIALITY: You and/or your care partner have signed paperwork which will be entered into your electronic medical record.  These signatures attest to the fact that that the information above on your After Visit Summary has been reviewed and is understood.  Full responsibility of the confidentiality of this discharge information lies with you and/or your care-partner.

## 2016-08-27 NOTE — Op Note (Signed)
[facility] Patient Name: Alucard Fearnow Procedure Date: 08/27/2016 3:21 PM MRN: [id number] Endoscopist: Remo Lipps P. Armbruster MD, MD Age: 61 Referring MD:  Date of Birth: 18-May-1955 Gender: Male Account #: [id number] Procedure:                Colonoscopy Indications:              Screening for colorectal malignant neoplasm Medicines:                Monitored Anesthesia Care Procedure:                Pre-Anesthesia Assessment:                           - Prior to the procedure, a History and Physical                            was performed, and patient medications and                            allergies were reviewed. The patient's tolerance of                            previous anesthesia was also reviewed. The risks                            and benefits of the procedure and the sedation                            options and risks were discussed with the patient.                            All questions were answered, and informed consent                            was obtained. Prior Anticoagulants: The patient has                            taken aspirin, last dose was 1 day prior to                            procedure. ASA Grade Assessment: III - A patient                            with severe systemic disease. After reviewing the                            risks and benefits, the patient was deemed in                            satisfactory condition to undergo the procedure.                           After obtaining informed consent, the colonoscope  was passed under direct vision. Throughout the                            procedure, the patient's blood pressure, pulse, and                            oxygen saturations were monitored continuously. The                            Colonoscope was introduced through the anus and                            advanced to the the cecum, identified by                            appendiceal orifice  and ileocecal valve. The                            colonoscopy was performed without difficulty. The                            patient tolerated the procedure well. The quality                            of the bowel preparation was good. The ileocecal                            valve, appendiceal orifice, and rectum were                            photographed. Scope In: 3:31:04 PM Scope Out: 3:43:56 PM Scope Withdrawal Time: 0 hours 10 minutes 20 seconds  Total Procedure Duration: 0 hours 12 minutes 52 seconds  Findings:                 The perianal and digital rectal examinations were                            normal.                           Multiple medium-mouthed diverticula were found in                            the left colon and right colon.                           Internal hemorrhoids were found during                            retroflexion. The hemorrhoids were small.                           The exam was otherwise without abnormality. No  polyps. Complications:            No immediate complications. Estimated blood loss:                            None. Estimated Blood Loss:     Estimated blood loss: none. Estimated blood loss:                            none. Impression:               - Diverticulosis in the left colon and in the right                            colon.                           - Internal hemorrhoids.                           - The examination was otherwise normal.                           - No polyps. Recommendation:           - Patient has a contact number available for                            emergencies. The signs and symptoms of potential                            delayed complications were discussed with the                            patient. Return to normal activities tomorrow.                            Written discharge instructions were provided to the                            patient.                            - Resume previous diet.                           - Continue present medications.                           - Repeat colonoscopy in 10 years for screening                            purposes. Remo Lipps P. Armbruster MD, MD 08/27/2016 3:47:36 PM This report has been signed electronically.

## 2016-08-28 ENCOUNTER — Other Ambulatory Visit (HOSPITAL_COMMUNITY): Payer: Self-pay | Admitting: Adult Health

## 2016-08-28 ENCOUNTER — Other Ambulatory Visit: Payer: Self-pay | Admitting: Internal Medicine

## 2016-08-28 ENCOUNTER — Telehealth: Payer: Self-pay

## 2016-08-28 NOTE — Telephone Encounter (Signed)
   Follow up Call-  Call back number 08/27/2016  Post procedure Call Back phone  # [phone]  Permission to leave phone message Yes  Some recent data might be hidden     Patient questions:  Do you have a fever, pain , or abdominal swelling? No. Pain Score  0 *  Have you tolerated food without any problems? Yes.    Have you been able to return to your normal activities? Yes.    Do you have any questions about your discharge instructions: Diet   No. Medications  No. Follow up visit  No.  Do you have questions or concerns about your Care? No.  Actions: * If pain score is 4 or above: No action needed, pain <4.

## 2016-09-20 ENCOUNTER — Encounter: Payer: Self-pay | Admitting: Family

## 2016-09-20 ENCOUNTER — Ambulatory Visit: Payer: Medicare Other | Attending: Family | Admitting: Family

## 2016-09-20 VITALS — BP 95/74 | HR 97 | Resp 20 | Ht 76.0 in | Wt 178.5 lb

## 2016-09-20 DIAGNOSIS — Z7982 Long term (current) use of aspirin: Secondary | ICD-10-CM | POA: Insufficient documentation

## 2016-09-20 DIAGNOSIS — I2510 Atherosclerotic heart disease of native coronary artery without angina pectoris: Secondary | ICD-10-CM | POA: Diagnosis not present

## 2016-09-20 DIAGNOSIS — M1990 Unspecified osteoarthritis, unspecified site: Secondary | ICD-10-CM | POA: Insufficient documentation

## 2016-09-20 DIAGNOSIS — J45909 Unspecified asthma, uncomplicated: Secondary | ICD-10-CM | POA: Diagnosis not present

## 2016-09-20 DIAGNOSIS — Z8249 Family history of ischemic heart disease and other diseases of the circulatory system: Secondary | ICD-10-CM | POA: Insufficient documentation

## 2016-09-20 DIAGNOSIS — E785 Hyperlipidemia, unspecified: Secondary | ICD-10-CM | POA: Insufficient documentation

## 2016-09-20 DIAGNOSIS — Z79899 Other long term (current) drug therapy: Secondary | ICD-10-CM | POA: Insufficient documentation

## 2016-09-20 DIAGNOSIS — Z833 Family history of diabetes mellitus: Secondary | ICD-10-CM | POA: Diagnosis not present

## 2016-09-20 DIAGNOSIS — Z888 Allergy status to other drugs, medicaments and biological substances status: Secondary | ICD-10-CM | POA: Diagnosis not present

## 2016-09-20 DIAGNOSIS — R42 Dizziness and giddiness: Secondary | ICD-10-CM | POA: Insufficient documentation

## 2016-09-20 DIAGNOSIS — E119 Type 2 diabetes mellitus without complications: Secondary | ICD-10-CM | POA: Insufficient documentation

## 2016-09-20 DIAGNOSIS — I5022 Chronic systolic (congestive) heart failure: Secondary | ICD-10-CM | POA: Diagnosis not present

## 2016-09-20 DIAGNOSIS — M109 Gout, unspecified: Secondary | ICD-10-CM | POA: Insufficient documentation

## 2016-09-20 DIAGNOSIS — I10 Essential (primary) hypertension: Secondary | ICD-10-CM

## 2016-09-20 DIAGNOSIS — I426 Alcoholic cardiomyopathy: Secondary | ICD-10-CM | POA: Diagnosis not present

## 2016-09-20 DIAGNOSIS — I110 Hypertensive heart disease with heart failure: Secondary | ICD-10-CM | POA: Diagnosis not present

## 2016-09-20 DIAGNOSIS — F17210 Nicotine dependence, cigarettes, uncomplicated: Secondary | ICD-10-CM | POA: Diagnosis not present

## 2016-09-20 DIAGNOSIS — Z9889 Other specified postprocedural states: Secondary | ICD-10-CM | POA: Diagnosis not present

## 2016-09-20 DIAGNOSIS — Z720 Tobacco use: Secondary | ICD-10-CM

## 2016-09-20 MED ORDER — FOLIC ACID 1 MG PO TABS: 1.0000 mg | ORAL_TABLET | Freq: Every day | ORAL | 3 refills | Status: DC

## 2016-09-20 NOTE — Progress Notes (Signed)
Patient ID: Travis [name], male    DOB: [birth year], 61 y.o.   MRN: [id number]  HPI  Travis [name] is a 61 y/o male with a history of osteoarthritis, hyperlipidemia, gout, HTN, GI bleed, DM, CAD, anemia, asthma, previous alcohol use, current tobacco use and chronic heart failure.  Reviewed last echo done 08/08/16 which showed an EF of 35-40%. This is an improvement from his last echo which was done 01/26/15 and showed an EF of 25-30% along with trivial Travis/ TR. EF has dropped from 2015 when it was 45-50%. Had a cardiac catheterization done 01/28/15 which showed 10% stenosis in Prox RCA to Mid RCA and 30% stenosis in Prox LAD to Mid LAD which is not much different from previous angiography. Cardiomyopathy felt to be due to previous alcohol use.   Was in the ED on 03/29/16 due to acute frontal sinusitis. Was treated and released. Previously was in the ED on 09/06/15 for constipation after surgery. Was given an enema with good results and he was discharged home. Was last admitted on 09/01/15 for right shoulder shoulder and was discharged the following day. Last HF admission was November 2016.  He presents today for a follow-up visit with a chief complaint of light-headedness. He describes this as occurring intermittently with position changes and especially in the hot weather. He says that this has been occurring for the last few weeks. Denies any associated symptoms such as blurry vision, chest pain or headaches.   Past Medical History:  Diagnosis Date  . Alcohol abuse   . 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 Allergy   . Asthma   . 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 Chronic anemia   . Chronic systolic CHF (congestive heart failure) ([place])    a. EF 20-25% by echo 12/28/10; b improved to 45-50% on echo in June 2015  . 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 Clotting disorder ([place])   . Coronary artery disease    Cardiac catheterization in  November 2016 showed mild 30% proximal LAD stenosis with no evidence of obstructive disease.  . Diabetes mellitus, type 2 ([place])    pt reports his DM is gone  . GI bleed    15 years ago  . Gout   . Hemoptysis    secondary to pulmonary edema  . Hyperlipidemia   . Hypertension   . Nonischemic cardiomyopathy ([place])    minimal coronary disease by cath 12/29/10  . Osteoarthritis   . Pneumonia   . Tobacco abuse   . Vision loss    peripherial vision only left eye.[facility] Retinla  artery occusion    Past Surgical History:  Procedure Laterality Date  . CARDIAC CATHETERIZATION    . CARDIAC CATHETERIZATION N/A 01/28/2015   Procedure: Left Heart Cath and Coronary Angiography;  Surgeon: Wellington Hampshire, MD;  Location: [facility];  Service: Cardiovascular;  Laterality: N/A;  . COLONOSCOPY    . HIP ARTHROPLASTY Right 03/15/2013   Procedure: ARTHROPLASTY BIPOLAR HIP;  Surgeon: [name], MD;  Location: [facility];  Service: Orthopedics;  Laterality: Right;  . TOTAL SHOULDER ARTHROPLASTY Right 09/01/2015   Procedure: RIGHT TOTAL SHOULDER ARTHROPLASTY;  Surgeon: [name], MD;  Location: [facility];  Service: Orthopedics;  Laterality: Right;   Family History  Problem Relation Age of Onset  . Diabetes Mother   . Hypertension Mother   . Diabetes Father   . Hypertension Father   .  Diabetes Sister   . Dementia Brother   . Cancer Neg Hx   . Heart disease Neg Hx   . Stroke Neg Hx   . Colon cancer Neg Hx   . Esophageal cancer Neg Hx   . Rectal cancer Neg Hx   . Stomach cancer Neg Hx    Social History  Substance Use Topics  . Smoking status: Current Every Day Smoker    Packs/day: 0.50    Years: 30.00    Types: Cigarettes  . Smokeless tobacco: Never Used  . Alcohol use 0.6 oz/week    1 Glasses of wine per week     Comment: occasionally   Allergies  Allergen Reactions  . Tramadol Itching and Rash   Prior to Admission medications   Medication Sig Start Date End Date Taking?  Authorizing Provider  albuterol (PROVENTIL HFA;VENTOLIN HFA) 108 (90 BASE) MCG/ACT inhaler Inhale 2 puffs into the lungs every 6 (six) hours as needed for wheezing or shortness of breath. Take 2 puffs every 5-10 minutes up to 6 puffs total over 15 minutes when needed.  Use with a spacer. Patient taking differently: Inhale 2 puffs into the lungs every 6 (six) hours as needed for wheezing or shortness of breath. Take 2 puffs up to 6 puffs total over 15 minutes when needed.  Use with a spacer. 01/24/15  Yes Ahmed Prima, MD  allopurinol (ZYLOPRIM) 100 MG tablet Take 100 mg by mouth daily.   Yes Historical Provider, MD  aspirin EC 325 MG tablet Take 162.5 mg by mouth daily.   Yes Historical Provider, MD  carvedilol (COREG) 12.5 MG tablet Take 1 tablet (12.5 mg total) by mouth 2 (two) times daily with a meal. 10/20/15  Yes Alisa Graff, FNP  cetirizine (ZYRTEC) 10 MG tablet Take 1 tablet (10 mg total) by mouth daily. 06/07/16  Yes Alisa Graff, FNP  fenofibrate (TRICOR) 145 MG tablet Take 1 tablet (145 mg total) by mouth daily. 05/16/16  Yes [name], NP  fluticasone (FLONASE) 50 MCG/ACT nasal spray Place 1 spray into both nostrils 2 (two) times daily. 03/29/16  Yes Roderic Palau D Cuthriell, PA-C  folic acid (FOLVITE) 1 MG tablet TAKE 1 TABLET BY MOUTH ONCE A DAY 04/30/16  Yes Amy D Clegg, NP  Multiple Vitamin (MULTIVITAMIN WITH MINERALS) TABS tablet Take 1 tablet by mouth daily. Men's One a Day   Yes Historical Provider, MD  naproxen (NAPROSYN) 500 MG tablet Take 500 mg by mouth 2 (two) times daily as needed for moderate pain (pain).    Yes Historical Provider, MD  Omega-3 Fatty Acids (FISH OIL PO) Take 1 capsule by mouth daily.   Yes Historical Provider, MD  sacubitril-valsartan (ENTRESTO) 49-51 MG Take 1 tablet by mouth 2 (two) times daily. 02/27/16  Yes Alisa Graff, FNP  spironolactone (ALDACTONE) 25 MG tablet Take 0.5 tablets (12.5 mg total) by mouth daily. 12/01/15  Yes Alisa Graff, FNP   triamcinolone cream (KENALOG) 0.1 % Apply 1 application topically 2 (two) times daily. 05/09/16  Yes [name], NP    Review of Systems  Constitutional: Negative for appetite change and fatigue.  HENT: Positive for postnasal drip. Negative for congestion and sore throat.   Eyes: Negative.   Respiratory: Negative for cough and chest tightness.   Cardiovascular: Negative for chest pain, palpitations and leg swelling.  Gastrointestinal: Negative for abdominal distention and abdominal pain.  Endocrine: Negative.   Genitourinary: Negative.   Musculoskeletal: Negative for back  pain and neck pain.  Skin: Negative.   Allergic/Immunologic: Negative.   Neurological: Positive for light-headedness (with position changes in the hot weather). Negative for dizziness and headaches.  Hematological: Negative for adenopathy. Does not bruise/bleed easily.  Psychiatric/Behavioral: Negative for dysphoric mood, sleep disturbance (sleepingon 1 pillow) and suicidal ideas. The patient is not nervous/anxious.    Vitals:   09/20/16 1004  BP: 95/74  Pulse: 97  Resp: 20  SpO2: 100%  Weight: 178 lb 8 oz (81 kg)  Height: 6\' 4"  (1.93 m)   Wt Readings from Last 3 Encounters:  09/20/16 178 lb 8 oz (81 kg)  08/27/16 181 lb (82.1 kg)  08/22/16 181 lb (82.1 kg)   Lab Results  Component Value Date   CREATININE 1.25 05/09/2016   CREATININE 1.42 (H) 04/09/2016   CREATININE 1.21 09/06/2015   Physical Exam  Constitutional: He is oriented to person, place, and time. He appears well-developed and well-nourished.  HENT:  Head: Normocephalic and atraumatic.  Neck: Normal range of motion. Neck supple. No JVD present.  Cardiovascular: Normal rate and regular rhythm.   Pulmonary/Chest: Effort normal. He has no wheezes. He has no rales.  Abdominal: Soft. He exhibits no distension. There is no tenderness.  Musculoskeletal: He exhibits no edema or tenderness.  Neurological: He is alert and oriented to person,  place, and time.  Skin: Skin is warm and dry.  Psychiatric: He has a normal mood and affect. His behavior is normal. Thought content normal.  Nursing note and vitals reviewed.    Assessment & Plan:  1: Chronic heart failure with reduced ejection fraction- - NYHA class I - euvolemic - continue weighing daily and call for an overnight weight gain of >2 pounds/weekly weight gain of >5 pounds. Weight stable since he was last here. Reports eating healthier and being more active. - not adding any salt to his food - echo done 08/08/16 and shows improvement in his EF - saw cardiologist Fletcher Anon) 06/07/16  - Pharm D reviewed medications with the patient  2: HTN- - BP on the low side today so will not increase entresto at this time - saw his PCP Garnette Gunner) 05/09/16  3: Tobacco use- - continues to smoke about 1 ppd of cigarettes  - reports never quitting before and doesn't desire to quit at this time - complete cessation discussed for 3 minutes with him  Medication bottles were reviewed.   Return in 4 months or sooner for any questions/problems before then.

## 2016-09-20 NOTE — Patient Instructions (Signed)
Continue weighing daily and call for an overnight weight gain of > 2 pounds or a weekly weight gain of >5 pounds.    Smoking Cessation Quitting smoking is important to your health and has many advantages. However, it is not always easy to quit since nicotine is a very addictive drug. Oftentimes, people try 3 times or more before being able to quit. This document explains the best ways for you to prepare to quit smoking. Quitting takes hard work and a lot of effort, but you can do it. ADVANTAGES OF QUITTING SMOKING  You will live longer, feel better, and live better.  Your body will feel the impact of quitting smoking almost immediately.  Within 20 minutes, blood pressure decreases. Your pulse returns to its normal level.  After 8 hours, carbon monoxide levels in the blood return to normal. Your oxygen level increases.  After 24 hours, the chance of having a heart attack starts to decrease. Your breath, hair, and body stop smelling like smoke.  After 48 hours, damaged nerve endings begin to recover. Your sense of taste and smell improve.  After 72 hours, the body is virtually free of nicotine. Your bronchial tubes relax and breathing becomes easier.  After 2 to 12 weeks, lungs can hold more air. Exercise becomes easier and circulation improves.  The risk of having a heart attack, stroke, cancer, or lung disease is greatly reduced.  After 1 year, the risk of coronary heart disease is cut in half.  After 5 years, the risk of stroke falls to the same as a nonsmoker.  After 10 years, the risk of lung cancer is cut in half and the risk of other cancers decreases significantly.  After 15 years, the risk of coronary heart disease drops, usually to the level of a nonsmoker.  If you are pregnant, quitting smoking will improve your chances of having a healthy baby.  The people you live with, especially any children, will be healthier.  You will have extra money to spend on things other  than cigarettes. QUESTIONS TO THINK ABOUT BEFORE ATTEMPTING TO QUIT You may want to talk about your answers with your health care provider.  Why do you want to quit?  If you tried to quit in the past, what helped and what did not?  What will be the most difficult situations for you after you quit? How will you plan to handle them?  Who can help you through the tough times? Your family? Friends? A health care provider?  What pleasures do you get from smoking? What ways can you still get pleasure if you quit? Here are some questions to ask your health care provider:  How can you help me to be successful at quitting?  What medicine do you think would be best for me and how should I take it?  What should I do if I need more help?  What is smoking withdrawal like? How can I get information on withdrawal? GET READY  Set a quit date.  Change your environment by getting rid of all cigarettes, ashtrays, matches, and lighters in your home, car, or work. Do not let people smoke in your home.  Review your past attempts to quit. Think about what worked and what did not. GET SUPPORT AND ENCOURAGEMENT You have a better chance of being successful if you have help. You can get support in many ways.  Tell your family, friends, and coworkers that you are going to quit and need their support. Ask   them not to smoke around you.  Get individual, group, or telephone counseling and support. Programs are available at local hospitals and health centers. Call your local health department for information about programs in your area.  Spiritual beliefs and practices may help some smokers quit.  Download a "quit meter" on your computer to keep track of quit statistics, such as how long you have gone without smoking, cigarettes not smoked, and money saved.  Get a self-help book about quitting smoking and staying off tobacco. LEARN NEW SKILLS AND BEHAVIORS  Distract yourself from urges to smoke. Talk to  someone, go for a walk, or occupy your time with a task.  Change your normal routine. Take a different route to work. Drink tea instead of coffee. Eat breakfast in a different place.  Reduce your stress. Take a hot bath, exercise, or read a book.  Plan something enjoyable to do every day. Reward yourself for not smoking.  Explore interactive web-based programs that specialize in helping you quit. GET MEDICINE AND USE IT CORRECTLY Medicines can help you stop smoking and decrease the urge to smoke. Combining medicine with the above behavioral methods and support can greatly increase your chances of successfully quitting smoking.  Nicotine replacement therapy helps deliver nicotine to your body without the negative effects and risks of smoking. Nicotine replacement therapy includes nicotine gum, lozenges, inhalers, nasal sprays, and skin patches. Some may be available over-the-counter and others require a prescription.  Antidepressant medicine helps people abstain from smoking, but how this works is unknown. This medicine is available by prescription.  Nicotinic receptor partial agonist medicine simulates the effect of nicotine in your brain. This medicine is available by prescription. Ask your health care provider for advice about which medicines to use and how to use them based on your health history. Your health care provider will tell you what side effects to look out for if you choose to be on a medicine or therapy. Carefully read the information on the package. Do not use any other product containing nicotine while using a nicotine replacement product.  RELAPSE OR DIFFICULT SITUATIONS Most relapses occur within the first 3 months after quitting. Do not be discouraged if you start smoking again. Remember, most people try several times before finally quitting. You may have symptoms of withdrawal because your body is used to nicotine. You may crave cigarettes, be irritable, feel very hungry, cough  often, get headaches, or have difficulty concentrating. The withdrawal symptoms are only temporary. They are strongest when you first quit, but they will go away within 10-14 days. To reduce the chances of relapse, try to:  Avoid drinking alcohol. Drinking lowers your chances of successfully quitting.  Reduce the amount of caffeine you consume. Once you quit smoking, the amount of caffeine in your body increases and can give you symptoms, such as a rapid heartbeat, sweating, and anxiety.  Avoid smokers because they can make you want to smoke.  Do not let weight gain distract you. Many smokers will gain weight when they quit, usually less than 10 pounds. Eat a healthy diet and stay active. You can always lose the weight gained after you quit.  Find ways to improve your mood other than smoking. FOR MORE INFORMATION  www.smokefree.gov  Document Released: 03/06/2001 Document Revised: 07/27/2013 Document Reviewed: 06/21/2011 ExitCare Patient Information 2015 [employer]. This information is not intended to replace advice given to you by your health care provider. Make sure you discuss any questions you have with your   health care provider.  

## 2016-10-02 ENCOUNTER — Telehealth: Payer: Self-pay | Admitting: Internal Medicine

## 2016-10-02 NOTE — Telephone Encounter (Signed)
Pt stopped by to request refill for blood pressure medicine. States pharmacy faxed papers but received no response. Says he has been out of rx since Thursday, 7/5.

## 2016-10-03 ENCOUNTER — Other Ambulatory Visit: Payer: Self-pay | Admitting: Family

## 2016-10-03 ENCOUNTER — Telehealth: Payer: Self-pay | Admitting: Family

## 2016-10-03 MED ORDER — SACUBITRIL-VALSARTAN 49-51 MG PO TABS: 1.0000 | ORAL_TABLET | Freq: Two times a day (BID) | ORAL | 5 refills | Status: DC

## 2016-10-03 MED ORDER — SPIRONOLACTONE 25 MG PO TABS: 12.5000 mg | ORAL_TABLET | Freq: Every day | ORAL | 5 refills | Status: DC

## 2016-10-03 NOTE — Telephone Encounter (Signed)
Refilled entresto and spironolactone prescriptions.

## 2016-11-11 ENCOUNTER — Encounter (HOSPITAL_COMMUNITY): Payer: Self-pay | Admitting: Emergency Medicine

## 2016-11-11 ENCOUNTER — Emergency Department (HOSPITAL_COMMUNITY): Payer: Medicare Other

## 2016-11-11 ENCOUNTER — Emergency Department (HOSPITAL_COMMUNITY)
Admission: EM | Admit: 2016-11-11 | Discharge: 2016-11-11 | Disposition: A | Discharge status: Home / Self Care | Payer: Medicare Other | Attending: Emergency Medicine | Admitting: Emergency Medicine

## 2016-11-11 DIAGNOSIS — I110 Hypertensive heart disease with heart failure: Secondary | ICD-10-CM | POA: Insufficient documentation

## 2016-11-11 DIAGNOSIS — S161XXA Strain of muscle, fascia and tendon at neck level, initial encounter: Secondary | ICD-10-CM

## 2016-11-11 DIAGNOSIS — Z96611 Presence of right artificial shoulder joint: Secondary | ICD-10-CM | POA: Insufficient documentation

## 2016-11-11 DIAGNOSIS — E119 Type 2 diabetes mellitus without complications: Secondary | ICD-10-CM | POA: Insufficient documentation

## 2016-11-11 DIAGNOSIS — S199XXA Unspecified injury of neck, initial encounter: Secondary | ICD-10-CM | POA: Diagnosis present

## 2016-11-11 DIAGNOSIS — I5022 Chronic systolic (congestive) heart failure: Secondary | ICD-10-CM | POA: Diagnosis not present

## 2016-11-11 DIAGNOSIS — F17210 Nicotine dependence, cigarettes, uncomplicated: Secondary | ICD-10-CM | POA: Insufficient documentation

## 2016-11-11 DIAGNOSIS — Y999 Unspecified external cause status: Secondary | ICD-10-CM | POA: Insufficient documentation

## 2016-11-11 DIAGNOSIS — Z96641 Presence of right artificial hip joint: Secondary | ICD-10-CM | POA: Diagnosis not present

## 2016-11-11 DIAGNOSIS — Z23 Encounter for immunization: Secondary | ICD-10-CM | POA: Insufficient documentation

## 2016-11-11 DIAGNOSIS — I2510 Atherosclerotic heart disease of native coronary artery without angina pectoris: Secondary | ICD-10-CM | POA: Insufficient documentation

## 2016-11-11 DIAGNOSIS — Z79899 Other long term (current) drug therapy: Secondary | ICD-10-CM | POA: Diagnosis not present

## 2016-11-11 DIAGNOSIS — Y92009 Unspecified place in unspecified non-institutional (private) residence as the place of occurrence of the external cause: Secondary | ICD-10-CM | POA: Insufficient documentation

## 2016-11-11 DIAGNOSIS — M542 Cervicalgia: Secondary | ICD-10-CM | POA: Diagnosis not present

## 2016-11-11 DIAGNOSIS — S299XXA Unspecified injury of thorax, initial encounter: Secondary | ICD-10-CM | POA: Diagnosis not present

## 2016-11-11 DIAGNOSIS — W19XXXA Unspecified fall, initial encounter: Secondary | ICD-10-CM | POA: Insufficient documentation

## 2016-11-11 DIAGNOSIS — M546 Pain in thoracic spine: Secondary | ICD-10-CM | POA: Diagnosis not present

## 2016-11-11 DIAGNOSIS — J45909 Unspecified asthma, uncomplicated: Secondary | ICD-10-CM | POA: Insufficient documentation

## 2016-11-11 DIAGNOSIS — Y9389 Activity, other specified: Secondary | ICD-10-CM | POA: Insufficient documentation

## 2016-11-11 DIAGNOSIS — M5414 Radiculopathy, thoracic region: Secondary | ICD-10-CM | POA: Diagnosis not present

## 2016-11-11 LAB — COMPREHENSIVE METABOLIC PANEL
ALBUMIN: 3.9 g/dL (ref 3.5–5.0)
ALK PHOS: 41 U/L (ref 38–126)
ALT: 16 U/L — ABNORMAL LOW (ref 17–63)
AST: 28 U/L (ref 15–41)
Anion gap: 9 (ref 5–15)
BUN: 29 mg/dL — AB (ref 6–20)
CHLORIDE: 105 mmol/L (ref 101–111)
CO2: 26 mmol/L (ref 22–32)
CREATININE: 1.47 mg/dL — AB (ref 0.61–1.24)
Calcium: 9.3 mg/dL (ref 8.9–10.3)
GFR calc Af Amer: 58 mL/min — ABNORMAL LOW (ref >60)
GFR calc non Af Amer: 50 mL/min — ABNORMAL LOW (ref >60)
GLUCOSE: 103 mg/dL — AB (ref 65–99)
Potassium: 4.1 mmol/L (ref 3.5–5.1)
Sodium: 140 mmol/L (ref 135–145)
Total Bilirubin: 0.7 mg/dL (ref 0.3–1.2)
Total Protein: 6.7 g/dL (ref 6.5–8.1)

## 2016-11-11 LAB — CBC
HCT: 35.9 % — ABNORMAL LOW (ref 39.0–52.0)
Hemoglobin: 11.9 g/dL — ABNORMAL LOW (ref 13.0–17.0)
MCH: 31.1 pg (ref 26.0–34.0)
MCHC: 33.1 g/dL (ref 30.0–36.0)
MCV: 93.7 fL (ref 78.0–100.0)
PLATELETS: 230 10*3/uL (ref 150–400)
RBC: 3.83 MIL/uL — ABNORMAL LOW (ref 4.22–5.81)
RDW: 14.5 % (ref 11.5–15.5)
WBC: 3.6 10*3/uL — ABNORMAL LOW (ref 4.0–10.5)

## 2016-11-11 LAB — TYPE AND SCREEN
ABO/RH(D): O POS
Antibody Screen: NEGATIVE

## 2016-11-11 MED ORDER — TETANUS-DIPHTH-ACELL PERTUSSIS 5-2.5-18.5 LF-MCG/0.5 IM SUSP
0.5000 mL | Freq: Once | INTRAMUSCULAR | Status: AC
  Administered 2016-11-11: 0.5 mL via INTRAMUSCULAR
  Filled 2016-11-11: qty 0.5

## 2016-11-11 MED ORDER — HYDROCODONE-ACETAMINOPHEN 5-325 MG PO TABS
1.0000 | ORAL_TABLET | Freq: Once | ORAL | Status: AC
  Administered 2016-11-11: 1 via ORAL
  Filled 2016-11-11: qty 1

## 2016-11-11 NOTE — ED Notes (Signed)
ED Provider at bedside. 

## 2016-11-11 NOTE — Discharge Instructions (Signed)
Please read attached information. If you experience any new or worsening signs or symptoms please return to the emergency room for evaluation. Please follow-up with your primary care provider or specialist as discussed.  Please use Tylenol as needed for discomfort.

## 2016-11-11 NOTE — ED Triage Notes (Signed)
The patient said he fell this past Thursday and he is complaining of both his shouder blades.  He said he fainted and hit his head on the table.  He has not been seen for the fall.  This  morning, he said he had a BM and there was blood in the stool.  He denies any blood thinners other than half an aspirin.   He rates his pain 9/10 in his shoulder blades.  No pain in his bottom.

## 2016-11-11 NOTE — ED Notes (Signed)
Pt returned from radiology.

## 2016-11-11 NOTE — ED Notes (Signed)
Patient transported to CT 

## 2016-11-11 NOTE — ED Notes (Signed)
Patient verbalized understanding of discharge instructions and denies any further needs or questions at this time. VS stable. Patient ambulatory with steady gait.  

## 2016-11-11 NOTE — ED Provider Notes (Signed)
[facility] DEPT Provider Note   CSN: [id number] Arrival date & time: 11/11/16  1109     History   Chief Complaint Chief Complaint  Patient presents with  . Melena  . Fall    HPI Travis [name] is a 61 y.o. male.  HPI   61 year old male presents status post fall.  Patient reports that 3 days ago he was outside for prolonged period of time.  He notes he was also drinking alcohol.  He came into the house and after standing up and felt slightly dizzy and lightheaded ended up passing out and striking his head on a table.  Patient notes this was brief episode of loss of consciousness.  Patient notes that after the fall he had a superficial abrasion to his left eye and nose.  He notes no significant neck or back pain at that time.  Patient reports over the last day he has developed neck and posterior shoulder pain.  He notes the symptoms are worse with palpation and movement of the shoulders and neck.  He denies any dizziness, lightheadedness, chest pain or shortness of breath today, denies any significant etiology other than dizziness prior to his syncopal episode.  Patient did not take any medication prior to arrival today.  He denies any neurological deficits.   Patient incidentally notes that he had a small amount of bright red blood per rectum with his bowel movement today.  He notes he was slightly constipated.  He notes that this happens from time to time as he has been diagnosed with internal hemorrhoids.   Past Medical History:  Diagnosis Date  . Alcohol abuse   . 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 Allergy   . Asthma   . 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 Chronic anemia   . Chronic systolic CHF (congestive heart failure) ([facility])    a. EF 20-25% by echo 12/28/10; b improved to 45-50% on echo in June 2015  . 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 Clotting disorder ([place])   . Coronary artery disease    Cardiac catheterization in November 2016 showed mild 30% proximal LAD stenosis with no evidence of obstructive disease.  . Diabetes mellitus, type 2 ([facility])    pt reports his DM is gone  . GI bleed    15 years ago  . Gout   . Hemoptysis    secondary to pulmonary edema  . Hyperlipidemia   . Hypertension   . Nonischemic cardiomyopathy ([place])    minimal coronary disease by cath 12/29/10  . Osteoarthritis   . Pneumonia   . Tobacco abuse   . 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 Osteoarthritis 05/09/2016  . Asthma 05/09/2016  . Essential hypertension 04/18/2015  . 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 Literacy level of illiterate 06/04/2014  . 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 Cardiomyopathy, nonischemic ([facility]) 03/15/2013  . Alcoholism in remission ([facility]) 03/14/2013  . Chronic systolic heart failure ([facility]) 01/05/2011  . Tobacco use 01/05/2011  . DM2 (diabetes mellitus, type 2) ([facility]) 11/30/2008  . HLD (hyperlipidemia) 11/30/2008  . Gout 11/30/2008    Past Surgical History:  Procedure Laterality Date  . CARDIAC CATHETERIZATION    . CARDIAC CATHETERIZATION N/A 01/28/2015   Procedure: Left Heart Cath and Coronary Angiography;  Surgeon: Rogue Jury  Ferne Reus, MD;  Location: [facility];  Service: Cardiovascular;  Laterality: N/A;  . COLONOSCOPY    . HIP ARTHROPLASTY Right 03/15/2013   Procedure: ARTHROPLASTY BIPOLAR HIP;  Surgeon: [name], MD;  Location: [facility];  Service: Orthopedics;  Laterality: Right;  . TOTAL SHOULDER ARTHROPLASTY Right 09/01/2015   Procedure: RIGHT TOTAL SHOULDER ARTHROPLASTY;  Surgeon: [name], MD;  Location: [facility];  Service: Orthopedics;  Laterality: Right;       Home Medications    Prior to Admission medications   Medication Sig Start Date End Date Taking? Authorizing Provider  allopurinol (ZYLOPRIM)  100 MG tablet TAKE 1 TABLET BY MOUTH ONCE A DAY Patient taking differently: TAKE 100mg  BY MOUTH ONCE A DAY 07/13/16  Yes [name], NP  aspirin EC 325 MG tablet Take 162.5 mg by mouth daily.   Yes [provider]  carvedilol (COREG) 12.5 MG tablet Take 1 tablet (12.5 mg total) by mouth 2 (two) times daily with a meal. 07/16/16  Yes Darylene Price A, FNP  cetirizine (ZYRTEC) 10 MG tablet Take 1 tablet (10 mg total) by mouth daily. 06/07/16  Yes Darylene Price A, FNP  fenofibrate (TRICOR) 145 MG tablet TAKE 1 TABLET BY MOUTH DAILY Patient taking differently: TAKE 145mg  BY MOUTH DAILY 08/28/16  Yes Baity, Coralie Keens, NP  folic acid (FOLVITE) 1 MG tablet Take 1 tablet (1 mg total) by mouth daily. 09/20/16  Yes Alisa Graff, FNP  Multiple Vitamin (MULTIVITAMIN WITH MINERALS) TABS tablet Take 1 tablet by mouth daily. Men's One a Day   Yes [provider]  naproxen (NAPROSYN) 500 MG tablet Take 500 mg by mouth 2 (two) times daily as needed for moderate pain (pain).    Yes [provider]  neomycin-bacitracin-polymyxin (NEOSPORIN) ointment Apply 1 application topically every 12 (twelve) hours. apply to eye   Yes [provider]  Omega-3 Fatty Acids (FISH OIL PO) Take 1 capsule by mouth daily.   Yes [provider]  sacubitril-valsartan (ENTRESTO) 49-51 MG Take 1 tablet by mouth 2 (two) times daily. 10/03/16  Yes Darylene Price A, FNP  spironolactone (ALDACTONE) 25 MG tablet Take 0.5 tablets (12.5 mg total) by mouth daily. 10/03/16  Yes Hackney, Otila Kluver A, FNP  albuterol (PROVENTIL HFA;VENTOLIN HFA) 108 (90 BASE) MCG/ACT inhaler Inhale 2 puffs into the lungs every 6 (six) hours as needed for wheezing or shortness of breath. Take 2 puffs every 5-10 minutes up to 6 puffs total over 15 minutes when needed.  Use with a spacer. Patient taking differently: Inhale 2 puffs into the lungs every 6 (six) hours as needed for wheezing or shortness of breath. Take 2 puffs up to 6 puffs  total over 15 minutes when needed.  Use with a spacer. 01/24/15   Ahmed Prima, MD  fluticasone (FLONASE) 50 MCG/ACT nasal spray Place 1 spray into both nostrils 2 (two) times daily. Patient taking differently: Place 1 spray into both nostrils 2 (two) times daily as needed for allergies.  03/29/16   Cuthriell, Charline Bills, PA-C    Family History Family History  Problem Relation Age of Onset  . Diabetes Mother   . Hypertension Mother   . Diabetes Father   . Hypertension Father   . Diabetes Sister   . Dementia Brother   . Cancer Neg Hx   . Heart disease Neg Hx   . Stroke Neg Hx   . Colon cancer Neg Hx   . Esophageal cancer Neg  Hx   . Rectal cancer Neg Hx   . Stomach cancer Neg Hx     Social History Social History  Substance Use Topics  . Smoking status: Current Every Day Smoker    Packs/day: 0.50    Years: 30.00    Types: Cigarettes  . Smokeless tobacco: Never Used  . Alcohol use 0.6 oz/week    1 Glasses of wine per week     Comment: occasionally     Allergies   Tramadol   Review of Systems Review of Systems  All other systems reviewed and are negative.    Physical Exam Updated Vital Signs BP (!) 159/99   Pulse 71   Temp 98.8 F (37.1 C) (Oral)   Resp 13   Ht 6\' 4"  (1.93 m)   Wt 79.4 kg (175 lb)   SpO2 100%   BMI 21.30 kg/m   Physical Exam  Constitutional: He is oriented to person, place, and time. He appears well-developed and well-nourished.  HENT:  Head: Normocephalic.  Superficial abrasion surrounding the left eye and nose, no signs of surrounding infection, no bony abnormalities or deformities, nontender to palpation  Eyes: Pupils are equal, round, and reactive to light. Conjunctivae are normal. Right eye exhibits no discharge. Left eye exhibits no discharge. No scleral icterus.  Neck: Normal range of motion. No JVD present. No tracheal deviation present.  Cardiovascular: Normal rate, regular rhythm, normal heart sounds and intact distal  pulses.  Exam reveals no gallop and no friction rub.   No murmur heard. Pulmonary/Chest: Effort normal and breath sounds normal. No stridor. No respiratory distress. He has no wheezes. He has no rales. He exhibits no tenderness.  Musculoskeletal:  Tenderness to palpation of the cervical and upper thoracic spine and soft tissue diffusely, pain with palpation of the upper trapezius, no scapular tenderness to palpation, full active range of motion of the upper extremities.  Upper and lower extremity sensation strength and motor function 5 out of 5 and intact.  No clavicular tenderness  Neurological: He is alert and oriented to person, place, and time. He has normal strength. No cranial nerve deficit or sensory deficit. Coordination normal. GCS eye subscore is 4. GCS verbal subscore is 5. GCS motor subscore is 6.  Psychiatric: He has a normal mood and affect. His behavior is normal. Judgment and thought content normal.  Nursing note and vitals reviewed.    ED Treatments / Results  Labs (all labs ordered are listed, but only abnormal results are displayed) Labs Reviewed  COMPREHENSIVE METABOLIC PANEL - Abnormal; Notable for the following:       Result Value   Glucose, Bld 103 (*)    BUN 29 (*)    Creatinine, Ser 1.47 (*)    ALT 16 (*)    GFR calc non Af Amer 50 (*)    GFR calc Af Amer 58 (*)    All other components within normal limits  CBC - Abnormal; Notable for the following:    WBC 3.6 (*)    RBC 3.83 (*)    Hemoglobin 11.9 (*)    HCT 35.9 (*)    All other components within normal limits  POC OCCULT BLOOD, ED  TYPE AND SCREEN    EKG  EKG Interpretation None       Radiology Dg Thoracic Spine 2 View  Result Date: 11/11/2016 CLINICAL DATA:  Fall this morning.  Pain in back. EXAM: THORACIC SPINE 2 VIEWS COMPARISON:  None. FINDINGS: Minimal anterior wedging of  a lower thoracic vertebral body is unchanged since February 2017. No evidence of acute fracture. Degenerative changes in  the cervical spine. No traumatic malalignment. IMPRESSION: No acute abnormality. Electronically Signed   By: Dorise Bullion III M.D   On: 11/11/2016 13:45   Ct Cervical Spine Wo Contrast  Result Date: 11/11/2016 CLINICAL DATA:  Pain following fall EXAM: CT CERVICAL SPINE WITHOUT CONTRAST TECHNIQUE: Multidetector CT imaging of the cervical spine was performed without intravenous contrast. Multiplanar CT image reconstructions were also generated. COMPARISON:  March 14, 2013 FINDINGS: Alignment: There is cervicothoracic levoscoliosis. There is 2 mm of retrolisthesis of C6 on C7 and 1 mm of anterolisthesis of C7 on T1. Skull base and vertebrae: Craniocervical junction and skull base regions appear normal. No fracture is appreciable. There are no blastic or lytic bone lesions. Soft tissues and spinal canal: Prevertebral soft tissues and predental space regions are normal. There are no paraspinous lesions. There is no cord or canal hematoma. Disc levels: There is marked disc space narrowing at C4-5 and C6-7. There is moderately severe disc space narrowing at C5-6 and C7-T1. There is also moderately severe disc space narrowing at C2-3. There has been significant increase in disc space narrowing at C2-3 compared to prior study. There is erosive change along the inferior aspect of the C2 vertebral body, a change from prior study. There is also erosive change along the inferior aspect of the C7 vertebral body, present previously. There is erosive change along the superior endplate of C3, increased from prior study. There is multilevel facet hypertrophy. There is exit foraminal narrowing due to bony hypertrophy on the left at C3-4, on the left at C4-5, at C5-6 bilaterally, and on the right at C6-7. There is impression on the exiting nerve root on the right at C6-7 due to bony hypertrophy. There is also similar impression on the exiting nerve root on the left at C4-5 due to bony hypertrophy. There is no disc extrusion  or high-grade stenosis. Upper chest: Visualized upper lung zones are clear. Other: There is calcification in each carotid artery. IMPRESSION: 1. No fracture. Areas of slight spondylolisthesis are felt to be due to underlying spondylosis. 2. Multilevel arthropathy, progressed from previous study. Progression of arthropathy is most marked at C2-3 compared to the prior study. There are areas of erosive change in several endplate levels, most notably at C2 and C3. There are multiple areas of facet hypertrophy with exit foraminal narrowing most marked at C4-5 on the left at C6-7 on the right. There is impression on exiting nerve roots at these levels due to bony hypertrophy. 3.  No frank disc extrusion or high-grade stenosis appreciable. 4.  Foci of carotid artery calcification bilaterally. Electronically Signed   By: Lowella Grip III M.D.   On: 11/11/2016 14:04    Procedures Procedures (including critical care time)  Medications Ordered in ED Medications  HYDROcodone-acetaminophen (NORCO/VICODIN) 5-325 MG per tablet 1 tablet (1 tablet Oral Given 11/11/16 1316)  Tdap (BOOSTRIX) injection 0.5 mL (0.5 mLs Intramuscular Given 11/11/16 1454)     Initial Impression / Assessment and Plan / ED Course  I have reviewed the triage vital signs and the nursing notes.  Pertinent labs & imaging results that were available during my care of the patient were reviewed by me and considered in my medical decision making (see chart for details).       Final Clinical Impressions(s) / ED Diagnoses   Final diagnoses:  Strain of neck muscle, initial encounter  Acute bilateral thoracic back pain    61 year old male presents with neck and back pain status post fall.  Patient has no acute fractures noted on imaging.  Patient was drinking, also outside prior to his episode of syncope.  This is likely orthostatic in nature.  He has had no dizziness or lightheadedness since.  He is asymptomatic at this time.  He has no  neurological deficits.  His pain was improved with medication here.  He will be discharged home with instructions to follow-up with primary care if symptoms recur, return to the emergency room immediately for any concerning signs or symptoms.  He will use Tylenol as needed for discomfort.  Patient also had blood per rectum.  This is typical of his internal hemorrhoids, he reports he is no longer having any this was one episode with a hard bowel movement this morning.  His hemoglobin appears within normal range for him, no signs of significant blood loss for concerns.  Patient had colonoscopy in June of this year with internal hemorrhoids and no other acute findings.  Strict return precautions given, patient verbalized understanding and agreement to today's plan had no further questions or concerns  New Prescriptions New Prescriptions   No medications on file     Okey Regal, Hershal Coria 11/11/16 [place], MD 11/20/16 1527

## 2016-12-05 ENCOUNTER — Other Ambulatory Visit: Payer: Self-pay | Admitting: Internal Medicine

## 2017-01-07 ENCOUNTER — Other Ambulatory Visit: Payer: Self-pay | Admitting: Internal Medicine

## 2017-01-10 ENCOUNTER — Ambulatory Visit (INDEPENDENT_AMBULATORY_CARE_PROVIDER_SITE_OTHER): Payer: Medicare Other

## 2017-01-10 DIAGNOSIS — Z23 Encounter for immunization: Secondary | ICD-10-CM

## 2017-01-15 ENCOUNTER — Encounter: Payer: Self-pay | Admitting: Family

## 2017-01-15 ENCOUNTER — Ambulatory Visit: Payer: Medicare Other | Attending: Family | Admitting: Family

## 2017-01-15 VITALS — BP 159/88 | HR 90 | Resp 20 | Ht 76.0 in | Wt 173.2 lb

## 2017-01-15 DIAGNOSIS — Z888 Allergy status to other drugs, medicaments and biological substances status: Secondary | ICD-10-CM | POA: Insufficient documentation

## 2017-01-15 DIAGNOSIS — J45909 Unspecified asthma, uncomplicated: Secondary | ICD-10-CM | POA: Diagnosis not present

## 2017-01-15 DIAGNOSIS — Z7982 Long term (current) use of aspirin: Secondary | ICD-10-CM | POA: Insufficient documentation

## 2017-01-15 DIAGNOSIS — I2510 Atherosclerotic heart disease of native coronary artery without angina pectoris: Secondary | ICD-10-CM | POA: Diagnosis not present

## 2017-01-15 DIAGNOSIS — E119 Type 2 diabetes mellitus without complications: Secondary | ICD-10-CM

## 2017-01-15 DIAGNOSIS — Z96611 Presence of right artificial shoulder joint: Secondary | ICD-10-CM | POA: Diagnosis not present

## 2017-01-15 DIAGNOSIS — H547 Unspecified visual loss: Secondary | ICD-10-CM | POA: Diagnosis not present

## 2017-01-15 DIAGNOSIS — E785 Hyperlipidemia, unspecified: Secondary | ICD-10-CM | POA: Insufficient documentation

## 2017-01-15 DIAGNOSIS — Z8249 Family history of ischemic heart disease and other diseases of the circulatory system: Secondary | ICD-10-CM | POA: Insufficient documentation

## 2017-01-15 DIAGNOSIS — I110 Hypertensive heart disease with heart failure: Secondary | ICD-10-CM | POA: Insufficient documentation

## 2017-01-15 DIAGNOSIS — Z96641 Presence of right artificial hip joint: Secondary | ICD-10-CM | POA: Insufficient documentation

## 2017-01-15 DIAGNOSIS — F17210 Nicotine dependence, cigarettes, uncomplicated: Secondary | ICD-10-CM | POA: Diagnosis not present

## 2017-01-15 DIAGNOSIS — Z79899 Other long term (current) drug therapy: Secondary | ICD-10-CM | POA: Insufficient documentation

## 2017-01-15 DIAGNOSIS — Z7951 Long term (current) use of inhaled steroids: Secondary | ICD-10-CM | POA: Insufficient documentation

## 2017-01-15 DIAGNOSIS — I5022 Chronic systolic (congestive) heart failure: Secondary | ICD-10-CM | POA: Diagnosis not present

## 2017-01-15 DIAGNOSIS — I10 Essential (primary) hypertension: Secondary | ICD-10-CM

## 2017-01-15 DIAGNOSIS — M1990 Unspecified osteoarthritis, unspecified site: Secondary | ICD-10-CM | POA: Diagnosis not present

## 2017-01-15 DIAGNOSIS — Z720 Tobacco use: Secondary | ICD-10-CM

## 2017-01-15 DIAGNOSIS — M109 Gout, unspecified: Secondary | ICD-10-CM | POA: Diagnosis not present

## 2017-01-15 DIAGNOSIS — Z833 Family history of diabetes mellitus: Secondary | ICD-10-CM | POA: Insufficient documentation

## 2017-01-15 LAB — GLUCOSE, CAPILLARY: Glucose-Capillary: 98 mg/dL (ref 65–99)

## 2017-01-15 MED ORDER — SACUBITRIL-VALSARTAN 49-51 MG PO TABS: 1.0000 | ORAL_TABLET | Freq: Two times a day (BID) | ORAL | 5 refills | Status: DC

## 2017-01-15 NOTE — Progress Notes (Signed)
Patient ID: Travis [name], male    DOB: 05-11-1955, 61 y.o.   MRN: [id number]  HPI  Mr Chura is a 61 y/o male with a history of osteoarthritis, hyperlipidemia, gout, HTN, GI bleed, DM, CAD, anemia, asthma, previous alcohol use, current tobacco use and chronic heart failure.  Reviewed last echo done 08/08/16 which showed an EF of 35-40%. This is an improvement from his last echo which was done 01/26/15 and showed an EF of 25-30% along with trivial MR/ TR. EF has dropped from 2015 when it was 45-50%. Had a cardiac catheterization done 01/28/15 which showed 10% stenosis in Prox RCA to Mid RCA and 30% stenosis in Prox LAD to Mid LAD which is not much different from previous angiography. Cardiomyopathy felt to be due to previous alcohol use.   Was in the ED 11/11/16 due to a mechanical fall after feeling dizzy. He had been drinking alcohol prior to standing and feeling dizzy. He was evaluated and released home.  He presents today for a follow-up visit with a chief complaint of mild fatigue upon moderate exertion. He describes this as chronic in nature having been present for several years. He has associated postnasal drip. He denies any chest pain, edema, palpitations, dizziness or weight gain.   Past Medical History:  Diagnosis Date  . Alcohol abuse   . Alcoholic cardiomyopathy ([place]) 03/15/2013  . Allergy   . Asthma   . 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 Chronic anemia   . Chronic systolic CHF (congestive heart failure) ([place])    a. EF 20-25% by echo 12/28/10; b improved to 45-50% on echo in June 2015  . 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 Clotting disorder ([place])   . Coronary artery disease    Cardiac catheterization in November 2016 showed mild 30% proximal LAD stenosis with no evidence of obstructive disease.  . Diabetes mellitus, type 2 ([place])    pt reports his DM is gone  . GI bleed    15 years ago  . Gout   . Hemoptysis     secondary to pulmonary edema  . Hyperlipidemia   . Hypertension   . Nonischemic cardiomyopathy ([place])    minimal coronary disease by cath 12/29/10  . Osteoarthritis   . Pneumonia   . Tobacco abuse   . Vision loss    peripherial vision only left eye.[facility]  artery occusion    Past Surgical History:  Procedure Laterality Date  . CARDIAC CATHETERIZATION    . CARDIAC CATHETERIZATION N/A 01/28/2015   Procedure: Left Heart Cath and Coronary Angiography;  Surgeon: Wellington Hampshire, MD;  Location: [facility];  Service: Cardiovascular;  Laterality: N/A;  . COLONOSCOPY    . HIP ARTHROPLASTY Right 03/15/2013   Procedure: ARTHROPLASTY BIPOLAR HIP;  Surgeon: [name], MD;  Location: [facility];  Service: Orthopedics;  Laterality: Right;  . TOTAL SHOULDER ARTHROPLASTY Right 09/01/2015   Procedure: RIGHT TOTAL SHOULDER ARTHROPLASTY;  Surgeon: [name], MD;  Location: [facility];  Service: Orthopedics;  Laterality: Right;   Family History  Problem Relation Age of Onset  . Diabetes Mother   . Hypertension Mother   . Diabetes Father   . Hypertension Father   . Diabetes Sister   . Dementia Brother   . Cancer Neg Hx   . Heart disease Neg Hx   . Stroke Neg Hx   . Colon cancer Neg Hx   .  Esophageal cancer Neg Hx   . Rectal cancer Neg Hx   . Stomach cancer Neg Hx    Social History  Substance Use Topics  . Smoking status: Current Every Day Smoker    Packs/day: 0.50    Years: 30.00    Types: Cigarettes  . Smokeless tobacco: Never Used  . Alcohol use 0.6 oz/week    1 Glasses of wine per week     Comment: occasionally   Allergies  Allergen Reactions  . Tramadol Itching and Rash   Prior to Admission medications   Medication Sig Start Date End Date Taking? Authorizing Provider  albuterol (PROVENTIL HFA;VENTOLIN HFA) 108 (90 BASE) MCG/ACT inhaler Inhale 2 puffs into the lungs every 6 (six) hours as needed for wheezing or shortness of breath. Take 2 puffs every 5-10  minutes up to 6 puffs total over 15 minutes when needed.  Use with a spacer. Patient taking differently: Inhale 2 puffs into the lungs every 6 (six) hours as needed for wheezing or shortness of breath. Take 2 puffs up to 6 puffs total over 15 minutes when needed.  Use with a spacer. 01/24/15  Yes Ahmed Prima, MD  allopurinol (ZYLOPRIM) 100 MG tablet TAKE 1 TABLET BY MOUTH ONCE A DAY Patient taking differently: TAKE 100mg  BY MOUTH ONCE A DAY 07/13/16  Yes [name], NP  aspirin EC 325 MG tablet Take 162.5 mg by mouth daily.   Yes [provider]  carvedilol (COREG) 12.5 MG tablet Take 1 tablet (12.5 mg total) by mouth 2 (two) times daily with a meal. 07/16/16  Yes Darylene Price A, FNP  cetirizine (ZYRTEC) 10 MG tablet Take 1 tablet (10 mg total) by mouth daily. 06/07/16  Yes Darylene Price A, FNP  fenofibrate (TRICOR) 145 MG tablet TAKE 1 TABLET BY MOUTH ONCE A DAY 01/08/17  Yes Baity, Coralie Keens, NP  fluticasone (FLONASE) 50 MCG/ACT nasal spray Place 1 spray into both nostrils 2 (two) times daily. Patient taking differently: Place 1 spray into both nostrils 2 (two) times daily as needed for allergies.  03/29/16  Yes Cuthriell, Charline Bills, PA-C  folic acid (FOLVITE) 1 MG tablet Take 1 tablet (1 mg total) by mouth daily. 09/20/16  Yes Alisa Graff, FNP  Multiple Vitamin (MULTIVITAMIN WITH MINERALS) TABS tablet Take 1 tablet by mouth daily. Men's One a Day   Yes [provider]  naproxen (NAPROSYN) 500 MG tablet Take 500 mg by mouth 2 (two) times daily as needed for moderate pain (pain).    Yes [provider]  Omega-3 Fatty Acids (FISH OIL PO) Take 1 capsule by mouth daily.   Yes [provider]  sacubitril-valsartan (ENTRESTO) 49-51 MG Take 1 tablet by mouth 2 (two) times daily. 01/15/17  Yes Darylene Price A, FNP  spironolactone (ALDACTONE) 25 MG tablet Take 0.5 tablets (12.5 mg total) by mouth daily. 10/03/16  Yes Alisa Graff, FNP   Review of Systems   Constitutional: Positive for fatigue. Negative for appetite change.  HENT: Positive for postnasal drip. Negative for congestion and sore throat.   Eyes: Negative.   Respiratory: Negative for cough, chest tightness and shortness of breath.   Cardiovascular: Negative for chest pain, palpitations and leg swelling.  Gastrointestinal: Negative for abdominal distention and abdominal pain.  Endocrine: Negative.   Genitourinary: Negative.   Musculoskeletal: Negative for back pain and neck pain.  Skin: Negative.   Allergic/Immunologic: Negative.   Neurological: Negative for dizziness, light-headedness and headaches.  Hematological: Negative  for adenopathy. Does not bruise/bleed easily.  Psychiatric/Behavioral: Negative for dysphoric mood, sleep disturbance (sleepingon 1 pillow) and suicidal ideas. The patient is not nervous/anxious.    Vitals:   01/15/17 1003  BP: (!) 159/88  Pulse: 90  Resp: 20  SpO2: 100%  Weight: 173 lb 4 oz (78.6 kg)  Height: 6\' 4"  (1.93 m)   Wt Readings from Last 3 Encounters:  01/15/17 173 lb 4 oz (78.6 kg)  11/11/16 175 lb (79.4 kg)  09/20/16 178 lb 8 oz (81 kg)    Lab Results  Component Value Date   CREATININE 1.47 (H) 11/11/2016   CREATININE 1.25 05/09/2016   CREATININE 1.42 (H) 04/09/2016   Physical Exam  Constitutional: He is oriented to person, place, and time. He appears well-developed and well-nourished.  HENT:  Head: Normocephalic and atraumatic.  Neck: Normal range of motion. Neck supple. No JVD present.  Cardiovascular: Normal rate and regular rhythm.   Pulmonary/Chest: Effort normal. He has no wheezes. He has no rales.  Abdominal: Soft. He exhibits no distension. There is no tenderness.  Musculoskeletal: He exhibits no edema or tenderness.  Neurological: He is alert and oriented to person, place, and time.  Skin: Skin is warm and dry.  Psychiatric: He has a normal mood and affect. His behavior is normal. Thought content normal.  Nursing note  and vitals reviewed.    Assessment & Plan:  1: Chronic heart failure with reduced ejection fraction- - NYHA class II - euvolemic today - continue weighing daily and call for an overnight weight gain of >2 pounds/weekly weight gain of >5 pounds.  - weight down 5 pounds since he was last here - not adding any salt to his food - saw cardiologist Fletcher Anon) 06/07/16  - patient reports receiving his flu vaccine for this season already  2: HTN- - BP ok today - BMP from 11/11/16 reviewed and shows sodium 140, potassium 4.1 and GFR 58 - saw his PCP Garnette Gunner) 05/09/16  3: Diabetes- - diet controlled at this time - glucose in the clinic was 98 - A1c from 05/08/16 was 6.3%  4: Tobacco use- - continues to smoke about 1 ppd of cigarettes  - reports never quitting before and doesn't desire to quit at this time - complete cessation discussed for 3 minutes with him  Medication bottles were reviewed.   Return in 3 months or sooner for any questions/problems before then.

## 2017-01-15 NOTE — Patient Instructions (Addendum)
Resume weighing daily and call for an overnight weight gain of > 2 pounds or a weekly weight gain of >5 pounds.  Smoking Cessation Quitting smoking is important to your health and has many advantages. However, it is not always easy to quit since nicotine is a very addictive drug. Oftentimes, people try 3 times or more before being able to quit. This document explains the best ways for you to prepare to quit smoking. Quitting takes hard work and a lot of effort, but you can do it. ADVANTAGES OF QUITTING SMOKING  You will live longer, feel better, and live better.  Your body will feel the impact of quitting smoking almost immediately.  Within 20 minutes, blood pressure decreases. Your pulse returns to its normal level.  After 8 hours, carbon monoxide levels in the blood return to normal. Your oxygen level increases.  After 24 hours, the chance of having a heart attack starts to decrease. Your breath, hair, and body stop smelling like smoke.  After 48 hours, damaged nerve endings begin to recover. Your sense of taste and smell improve.  After 72 hours, the body is virtually free of nicotine. Your bronchial tubes relax and breathing becomes easier.  After 2 to 12 weeks, lungs can hold more air. Exercise becomes easier and circulation improves.  The risk of having a heart attack, stroke, cancer, or lung disease is greatly reduced.  After 1 year, the risk of coronary heart disease is cut in half.  After 5 years, the risk of stroke falls to the same as a nonsmoker.  After 10 years, the risk of lung cancer is cut in half and the risk of other cancers decreases significantly.  After 15 years, the risk of coronary heart disease drops, usually to the level of a nonsmoker.  If you are pregnant, quitting smoking will improve your chances of having a healthy baby.  The people you live with, especially any children, will be healthier.  You will have extra money to spend on things other than  cigarettes. QUESTIONS TO THINK ABOUT BEFORE ATTEMPTING TO QUIT You may want to talk about your answers with your health care provider.  Why do you want to quit?  If you tried to quit in the past, what helped and what did not?  What will be the most difficult situations for you after you quit? How will you plan to handle them?  Who can help you through the tough times? Your family? Friends? A health care provider?  What pleasures do you get from smoking? What ways can you still get pleasure if you quit? Here are some questions to ask your health care provider:  How can you help me to be successful at quitting?  What medicine do you think would be best for me and how should I take it?  What should I do if I need more help?  What is smoking withdrawal like? How can I get information on withdrawal? GET READY  Set a quit date.  Change your environment by getting rid of all cigarettes, ashtrays, matches, and lighters in your home, car, or work. Do not let people smoke in your home.  Review your past attempts to quit. Think about what worked and what did not. GET SUPPORT AND ENCOURAGEMENT You have a better chance of being successful if you have help. You can get support in many ways.  Tell your family, friends, and coworkers that you are going to quit and need their support. Ask them not  them not to smoke around you.  Get individual, group, or telephone counseling and support. Programs are available at local hospitals and health centers. Call your local health department for information about programs in your area.  Spiritual beliefs and practices may help some smokers quit.  Download a "quit meter" on your computer to keep track of quit statistics, such as how long you have gone without smoking, cigarettes not smoked, and money saved.  Get a self-help book about quitting smoking and staying off tobacco. LEARN NEW SKILLS AND BEHAVIORS  Distract yourself from urges to smoke. Talk to  someone, go for a walk, or occupy your time with a task.  Change your normal routine. Take a different route to work. Drink tea instead of coffee. Eat breakfast in a different place.  Reduce your stress. Take a hot bath, exercise, or read a book.  Plan something enjoyable to do every day. Reward yourself for not smoking.  Explore interactive web-based programs that specialize in helping you quit. GET MEDICINE AND USE IT CORRECTLY Medicines can help you stop smoking and decrease the urge to smoke. Combining medicine with the above behavioral methods and support can greatly increase your chances of successfully quitting smoking.  Nicotine replacement therapy helps deliver nicotine to your body without the negative effects and risks of smoking. Nicotine replacement therapy includes nicotine gum, lozenges, inhalers, nasal sprays, and skin patches. Some may be available over-the-counter and others require a prescription.  Antidepressant medicine helps people abstain from smoking, but how this works is unknown. This medicine is available by prescription.  Nicotinic receptor partial agonist medicine simulates the effect of nicotine in your brain. This medicine is available by prescription. Ask your health care provider for advice about which medicines to use and how to use them based on your health history. Your health care provider will tell you what side effects to look out for if you choose to be on a medicine or therapy. Carefully read the information on the package. Do not use any other product containing nicotine while using a nicotine replacement product.  RELAPSE OR DIFFICULT SITUATIONS Most relapses occur within the first 3 months after quitting. Do not be discouraged if you start smoking again. Remember, most people try several times before finally quitting. You may have symptoms of withdrawal because your body is used to nicotine. You may crave cigarettes, be irritable, feel very hungry, cough  often, get headaches, or have difficulty concentrating. The withdrawal symptoms are only temporary. They are strongest when you first quit, but they will go away within 10-14 days. To reduce the chances of relapse, try to:  Avoid drinking alcohol. Drinking lowers your chances of successfully quitting.  Reduce the amount of caffeine you consume. Once you quit smoking, the amount of caffeine in your body increases and can give you symptoms, such as a rapid heartbeat, sweating, and anxiety.  Avoid smokers because they can make you want to smoke.  Do not let weight gain distract you. Many smokers will gain weight when they quit, usually less than 10 pounds. Eat a healthy diet and stay active. You can always lose the weight gained after you quit.  Find ways to improve your mood other than smoking. FOR MORE INFORMATION  www.smokefree.gov  Document Released: 03/06/2001 Document Revised: 07/27/2013 Document Reviewed: 06/21/2011 Exit[employer] Patient Information 2015 [employer]. This information is not intended to replace advice given to you by your health care provider. Make sure you discuss any questions you have with your   provider.  

## 2017-02-13 ENCOUNTER — Encounter: Payer: Self-pay | Admitting: Internal Medicine

## 2017-02-13 ENCOUNTER — Ambulatory Visit (INDEPENDENT_AMBULATORY_CARE_PROVIDER_SITE_OTHER): Payer: Medicare Other | Admitting: Internal Medicine

## 2017-02-13 VITALS — BP 138/90 | HR 93 | Temp 99.0°F | Wt 177.0 lb

## 2017-02-13 DIAGNOSIS — R21 Rash and other nonspecific skin eruption: Secondary | ICD-10-CM

## 2017-02-13 MED ORDER — PREDNISONE 10 MG PO TABS: ORAL_TABLET | ORAL | 0 refills | Status: DC

## 2017-02-13 NOTE — Progress Notes (Signed)
Subjective:    Patient ID: Travis [name], male    DOB: 08/13/1955, 61 y.o.   MRN: [id number]  HPI  Pt presents to the clinic today with c/o a rash. He reports this started 2-3 weeks ago. The rash started on his feet. He reports is started like a red bump, then progressed into a blister, then scabs over and looks like dry skin. The rash itches. It has now spread to his arms and back. He has tried lotion and vaseline with minimal relief. No one in his family has a similar rash. He denies changes in soaps, lotions or detergents.   Review of Systems      Past Medical History:  Diagnosis Date  . Alcohol abuse   . 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 Allergy   . Asthma   . 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 Chronic anemia   . Chronic systolic CHF (congestive heart failure) ([place])    a. EF 20-25% by echo 12/28/10; b improved to 45-50% on echo in June 2015  . 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 Clotting disorder ([place])   . Coronary artery disease    Cardiac catheterization in November 2016 showed mild 30% proximal LAD stenosis with no evidence of obstructive disease.  . Diabetes mellitus, type 2 ([place])    pt reports his DM is gone  . GI bleed    15 years ago  . Gout   . Hemoptysis    secondary to pulmonary edema  . Hyperlipidemia   . Hypertension   . Nonischemic cardiomyopathy ([place])    minimal coronary disease by cath 12/29/10  . Osteoarthritis   . Pneumonia   . Tobacco abuse   . Vision loss    peripherial vision only left eye.[place]  artery occusion     Current Outpatient Medications  Medication Sig Dispense Refill  . albuterol (PROVENTIL HFA;VENTOLIN HFA) 108 (90 BASE) MCG/ACT inhaler Inhale 2 puffs into the lungs every 6 (six) hours as needed for wheezing or shortness of breath. Take 2 puffs every 5-10 minutes up to 6 puffs total over 15 minutes when needed.  Use with a spacer. (Patient  taking differently: Inhale 2 puffs into the lungs every 6 (six) hours as needed for wheezing or shortness of breath. Take 2 puffs up to 6 puffs total over 15 minutes when needed.  Use with a spacer.) 1 Inhaler 2  . allopurinol (ZYLOPRIM) 100 MG tablet TAKE 1 TABLET BY MOUTH ONCE A DAY (Patient taking differently: TAKE 100mg  BY MOUTH ONCE A DAY) 90 tablet 3  . aspirin EC 325 MG tablet Take 162.5 mg by mouth daily.    . carvedilol (COREG) 12.5 MG tablet Take 1 tablet (12.5 mg total) by mouth 2 (two) times daily with a meal. 60 tablet 5  . cetirizine (ZYRTEC) 10 MG tablet Take 1 tablet (10 mg total) by mouth daily. 30 tablet 11  . fenofibrate (TRICOR) 145 MG tablet TAKE 1 TABLET BY MOUTH ONCE A DAY 30 tablet 2  . fluticasone (FLONASE) 50 MCG/ACT nasal spray Place 1 spray into both nostrils 2 (two) times daily. (Patient taking differently: Place 1 spray into both nostrils 2 (two) times daily as needed for allergies. ) 16 g 0  . folic acid (FOLVITE) 1 MG tablet Take 1 tablet (1 mg total) by mouth daily. 30 tablet 3  . Multiple Vitamin (MULTIVITAMIN WITH MINERALS) TABS tablet Take 1 tablet  by mouth daily. Men's One a Day    . naproxen (NAPROSYN) 500 MG tablet Take 500 mg by mouth 2 (two) times daily as needed for moderate pain (pain).     . Omega-3 Fatty Acids (FISH OIL PO) Take 1 capsule by mouth daily.    . sacubitril-valsartan (ENTRESTO) 49-51 MG Take 1 tablet by mouth 2 (two) times daily. 60 tablet 5  . spironolactone (ALDACTONE) 25 MG tablet Take 0.5 tablets (12.5 mg total) by mouth daily. 15 tablet 5  . predniSONE (DELTASONE) 10 MG tablet Take 3 tabs on days 1-3, take 2 tabs on days 4-6, take 1 tab on days 7-9 18 tablet 0   No current facility-administered medications for this visit.     Allergies  Allergen Reactions  . Tramadol Itching and Rash    Family History  Problem Relation Age of Onset  . Diabetes Mother   . Hypertension Mother   . Diabetes Father   . Hypertension Father   .  Diabetes Sister   . Dementia Brother   . Cancer Neg Hx   . Heart disease Neg Hx   . Stroke Neg Hx   . Colon cancer Neg Hx   . Esophageal cancer Neg Hx   . Rectal cancer Neg Hx   . Stomach cancer Neg Hx     Social History   Socioeconomic History  . Marital status: Single    Spouse name: Not on file  . Number of children: 0  . Years of education: Not on file  . Highest education level: Not on file  Social Needs  . Financial resource strain: Not on file  . Food insecurity - worry: Not on file  . Food insecurity - inability: Not on file  . Transportation needs - medical: Not on file  . Transportation needs - non-medical: Not on file  Occupational History  . Occupation: retired  Tobacco Use  . Smoking status: Current Every Day Smoker    Packs/day: 0.50    Years: 30.00    Pack years: 15.00    Types: Cigarettes  . Smokeless tobacco: Never Used  Substance and Sexual Activity  . Alcohol use: Yes    Alcohol/week: 0.6 oz    Types: 1 Glasses of wine per week    Comment: occasionally  . Drug use: Yes    Types: Marijuana    Comment: 1 blunt 1-2 times a month- 08/29/15/17- "last week" 06/08/16-2 weeks ago  . Sexual activity: Yes  Other Topics Concern  . Not on file  Social History Narrative  . Not on file     Constitutional: Denies fever, malaise, fatigue, headache or abrupt weight changes.  Skin: Pt reports rash. Denies ulcercations.    No other specific complaints in a complete review of systems (except as listed in HPI above).  Objective:   Physical Exam   BP 138/90   Pulse 93   Temp 99 F (37.2 C) (Oral)   Wt 177 lb (80.3 kg)   SpO2 99%   BMI 21.55 kg/m  Wt Readings from Last 3 Encounters:  02/13/17 177 lb (80.3 kg)  01/15/17 173 lb 4 oz (78.6 kg)  11/11/16 175 lb (79.4 kg)    General: Appears his stated age, well developed, well nourished in NAD. Skin: Scab noted on dorsal surface of left foot, surrounded by dry skin. He has 2 lesions on his arms and 2  lesions on his back that are similar to welps with central ulceration.  BMET  Component Value Date/Time   NA 140 11/11/2016 1202   NA 140 05/23/2015 1038   NA 141 04/20/2014 1019   K 4.1 11/11/2016 1202   K 3.7 04/20/2014 1019   CL 105 11/11/2016 1202   CL 106 04/20/2014 1019   CO2 26 11/11/2016 1202   CO2 30 04/20/2014 1019   GLUCOSE 103 (H) 11/11/2016 1202   GLUCOSE 102 (H) 04/20/2014 1019   BUN 29 (H) 11/11/2016 1202   BUN 18 05/23/2015 1038   BUN 14 04/20/2014 1019   CREATININE 1.47 (H) 11/11/2016 1202   CREATININE 1.18 04/20/2014 1019   CALCIUM 9.3 11/11/2016 1202   CALCIUM 9.3 04/20/2014 1019   GFRNONAA 50 (L) 11/11/2016 1202   GFRNONAA >60 04/20/2014 1019   GFRNONAA >60 11/19/2012 1107   GFRAA 58 (L) 11/11/2016 1202   GFRAA >60 04/20/2014 1019   GFRAA >60 11/19/2012 1107    Lipid Panel     Component Value Date/Time   CHOL 197 08/21/2016 1301   TRIG 231.0 (H) 08/21/2016 1301   HDL 67.40 08/21/2016 1301   CHOLHDL 3 08/21/2016 1301   VLDL 46.2 (H) 08/21/2016 1301   LDLCALC UNABLE TO CALCULATE IF TRIGLYCERIDE OVER 400 mg/dL 09/14/2013 0510    CBC    Component Value Date/Time   WBC 3.6 (L) 11/11/2016 1202   RBC 3.83 (L) 11/11/2016 1202   HGB 11.9 (L) 11/11/2016 1202   HGB 13.5 04/20/2014 1019   HCT 35.9 (L) 11/11/2016 1202   HCT 41.8 04/20/2014 1019   PLT 230 11/11/2016 1202   PLT 348 04/20/2014 1019   MCV 93.7 11/11/2016 1202   MCV 90 04/20/2014 1019   MCH 31.1 11/11/2016 1202   MCHC 33.1 11/11/2016 1202   RDW 14.5 11/11/2016 1202   RDW 16.2 (H) 04/20/2014 1019   LYMPHSABS 1.9 09/06/2015 1632   LYMPHSABS 2.1 04/20/2014 1019   MONOABS 0.4 09/06/2015 1632   MONOABS 0.5 04/20/2014 1019   EOSABS 1.3 (H) 09/06/2015 1632   EOSABS 0.3 04/20/2014 1019   BASOSABS 0.0 09/06/2015 1632   BASOSABS 0.1 04/20/2014 1019   BASOSABS 0 06/13/2012 1515    Hgb A1C Lab Results  Component Value Date   HGBA1C 6.3 05/09/2016           Assessment & Plan:    Rash:  eRx for Pred Taper x 9 days Continue Vaseline or Eucerin Will have you follow up with derm if no improvement  RTC as needed or if symptoms persist or worsen BAITY, REGINA, NP

## 2017-02-13 NOTE — Patient Instructions (Signed)
Rash A rash is a change in the color of the skin. A rash can also change the way your skin feels. There are many different conditions and factors that can cause a rash. Follow these instructions at home: Pay attention to any changes in your symptoms. Follow these instructions to help with your condition: Medicine Take or apply over-the-counter and prescription medicines only as told by your doctor. These may include:  Corticosteroid cream.  Anti-itch lotions.  Oral antihistamines.  Skin Care  Put cool compresses on the affected areas.  Try taking a bath with: ? Epsom salts. Follow the instructions on the packaging. You can get these at your local pharmacy or grocery store. ? Baking soda. Pour a small amount into the bath as told by your doctor. ? Colloidal oatmeal. Follow the instructions on the packaging. You can get this at your local pharmacy or grocery store.  Try putting baking soda paste onto your skin. Stir water into baking soda until it gets like a paste.  Do not scratch or rub your skin.  Avoid covering the rash. Make sure the rash is exposed to air as much as possible. General instructions  Avoid hot showers or baths, which can make itching worse. A cold shower may help.  Avoid scented soaps, detergents, and perfumes. Use gentle soaps, detergents, perfumes, and other cosmetic products.  Avoid anything that causes your rash. Keep a journal to help track what causes your rash. Write down: ? What you eat. ? What cosmetic products you use. ? What you drink. ? What you wear. This includes jewelry.  Keep all follow-up visits as told by your doctor. This is important. Contact a doctor if:  You sweat at night.  You lose weight.  You pee (urinate) more than normal.  You feel weak.  You throw up (vomit).  Your skin or the whites of your eyes look yellow (jaundice).  Your skin: ? Tingles. ? Is numb.  Your rash: ? Does not go away after a few days. ? Gets  worse.  You are: ? More thirsty than normal. ? More tired than normal.  You have: ? New symptoms. ? Pain in your belly (abdomen). ? A fever. ? Watery poop (diarrhea). Get help right away if:  Your rash covers all or most of your body. The rash may or may not be painful.  You have blisters that: ? Are on top of the rash. ? Grow larger. ? Grow together. ? Are painful. ? Are inside your nose or mouth.  You have a rash that: ? Looks like purple pinprick-sized spots all over your body. ? Has a "bull's eye" or looks like a target. ? Is red and painful, causes your skin to peel, and is not from being in the sun too long. This information is not intended to replace advice given to you by your health care provider. Make sure you discuss any questions you have with your health care provider. Document Released: 08/29/2007 Document Revised: 08/18/2015 Document Reviewed: 07/28/2014 Elsevier Interactive Patient Education  2018 [employer] Inc[employer] 

## 2017-04-16 NOTE — Progress Notes (Signed)
Patient ID: Travis [name], male    DOB: 1955-06-05, 62 y.o.   MRN: [id number]  HPI  Travis [name] is a 62 y/o male with a history of osteoarthritis, hyperlipidemia, gout, HTN, GI bleed, DM, CAD, anemia, asthma, previous alcohol use, current tobacco use and chronic heart failure.  Reviewed last echo done 08/08/16 which showed an EF of 35-40%. This is an improvement from his last echo which was done 01/26/15 and showed an EF of 25-30% along with trivial Travis/ TR. EF has dropped from 2015 when it was 45-50%. Had a cardiac catheterization done 01/28/15 which showed 10% stenosis in Prox RCA to Mid RCA and 30% stenosis in Prox LAD to Mid LAD which is not much different from previous angiography. Cardiomyopathy felt to be due to previous alcohol use.   Was in the ED 11/11/16 due to a mechanical fall after feeling dizzy. He had been drinking alcohol prior to standing and feeling dizzy. He was evaluated and released home.  He presents today for a follow-up visit with a chief complaint of minimal fatigue upon moderate exertion. He describes this as being present for several years. He has associated decreased appetite and occasional light-headedness along with this. He denies any chest pain, cough, shortness of breath, edema, palpitations, headaches, abdominal distention, difficulty sleeping or weight gain.   Past Medical History:  Diagnosis Date  . Alcohol abuse   . 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 Allergy   . Asthma   . 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 Chronic anemia   . Chronic systolic CHF (congestive heart failure) ([place])    a. EF 20-25% by echo 12/28/10; b improved to 45-50% on echo in June 2015  . 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 Clotting disorder ([place])   . Coronary artery disease    Cardiac catheterization in November 2016 showed mild 30% proximal LAD stenosis with no evidence of obstructive disease.  . Diabetes  mellitus, type 2 ([place])    pt reports his DM is gone  . GI bleed    15 years ago  . Gout   . Hemoptysis    secondary to pulmonary edema  . Hyperlipidemia   . Hypertension   . Nonischemic cardiomyopathy ([place])    minimal coronary disease by cath 12/29/10  . Osteoarthritis   . Pneumonia   . Tobacco abuse   . Vision loss    peripherial vision only left eye.[facility] Retin[facility]  artery occusion    Past Surgical History:  Procedure Laterality Date  . CARDIAC CATHETERIZATION    . CARDIAC CATHETERIZATION N/A 01/28/2015   Procedure: Left Heart Cath and Coronary Angiography;  Surgeon: Wellington Hampshire, MD;  Location: [facility];  Service: Cardiovascular;  Laterality: N/A;  . COLONOSCOPY    . HIP ARTHROPLASTY Right 03/15/2013   Procedure: ARTHROPLASTY BIPOLAR HIP;  Surgeon: [name], MD;  Location: [facility];  Service: Orthopedics;  Laterality: Right;  . TOTAL SHOULDER ARTHROPLASTY Right 09/01/2015   Procedure: RIGHT TOTAL SHOULDER ARTHROPLASTY;  Surgeon: [name], MD;  Location: [facility];  Service: Orthopedics;  Laterality: Right;   Family History  Problem Relation Age of Onset  . Diabetes Mother   . Hypertension Mother   . Diabetes Father   . Hypertension Father   . Diabetes Sister   . Dementia Brother   . Cancer Neg Hx   . Heart disease Neg Hx   . Stroke Neg  Hx   . Colon cancer Neg Hx   . Esophageal cancer Neg Hx   . Rectal cancer Neg Hx   . Stomach cancer Neg Hx    Social History   Tobacco Use  . Smoking status: Current Every Day Smoker    Packs/day: 0.50    Years: 30.00    Pack years: 15.00    Types: Cigarettes  . Smokeless tobacco: Never Used  Substance Use Topics  . Alcohol use: Yes    Alcohol/week: 0.6 oz    Types: 1 Glasses of wine per week    Comment: occasionally   Allergies  Allergen Reactions  . Tramadol Itching and Rash   Past Medical History:  Diagnosis Date  . Alcohol abuse   . 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 Allergy   .  Asthma   . 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 Chronic anemia   . Chronic systolic CHF (congestive heart failure) ([place])    a. EF 20-25% by echo 12/28/10; b improved to 45-50% on echo in June 2015  . 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 Clotting disorder ([place])   . Coronary artery disease    Cardiac catheterization in November 2016 showed mild 30% proximal LAD stenosis with no evidence of obstructive disease.  . Diabetes mellitus, type 2 ([place])    pt reports his DM is gone  . GI bleed    15 years ago  . Gout   . Hemoptysis    secondary to pulmonary edema  . Hyperlipidemia   . Hypertension   . Nonischemic cardiomyopathy ([place])    minimal coronary disease by cath 12/29/10  . Osteoarthritis   . Pneumonia   . Tobacco abuse   . Vision loss    peripherial vision only left eye.[facility]ra Retinla  artery occusion    Past Surgical History:  Procedure Laterality Date  . CARDIAC CATHETERIZATION    . CARDIAC CATHETERIZATION N/A 01/28/2015   Procedure: Left Heart Cath and Coronary Angiography;  Surgeon: Wellington Hampshire, MD;  Location: [facility];  Service: Cardiovascular;  Laterality: N/A;  . COLONOSCOPY    . HIP ARTHROPLASTY Right 03/15/2013   Procedure: ARTHROPLASTY BIPOLAR HIP;  Surgeon: [name], MD;  Location: [facility];  Service: Orthopedics;  Laterality: Right;  . TOTAL SHOULDER ARTHROPLASTY Right 09/01/2015   Procedure: RIGHT TOTAL SHOULDER ARTHROPLASTY;  Surgeon: [name], MD;  Location: [facility];  Service: Orthopedics;  Laterality: Right;   Family History  Problem Relation Age of Onset  . Diabetes Mother   . Hypertension Mother   . Diabetes Father   . Hypertension Father   . Diabetes Sister   . Dementia Brother   . Cancer Neg Hx   . Heart disease Neg Hx   . Stroke Neg Hx   . Colon cancer Neg Hx   . Esophageal cancer Neg Hx   . Rectal cancer Neg Hx   . Stomach cancer Neg Hx    Social History    Tobacco Use  . Smoking status: Current Every Day Smoker    Packs/day: 0.50    Years: 30.00    Pack years: 15.00    Types: Cigarettes  . Smokeless tobacco: Never Used  Substance Use Topics  . Alcohol use: Yes    Alcohol/week: 0.6 oz    Types: 1 Glasses of wine per week    Comment: occasionally   Allergies  Allergen Reactions  .  Tramadol Itching and Rash   Prior to Admission medications   Medication Sig Start Date End Date Taking? Authorizing Provider  albuterol (PROVENTIL HFA;VENTOLIN HFA) 108 (90 BASE) MCG/ACT inhaler Inhale 2 puffs into the lungs every 6 (six) hours as needed for wheezing or shortness of breath. Take 2 puffs every 5-10 minutes up to 6 puffs total over 15 minutes when needed.  Use with a spacer. Patient taking differently: Inhale 2 puffs into the lungs every 6 (six) hours as needed for wheezing or shortness of breath. Take 2 puffs up to 6 puffs total over 15 minutes when needed.  Use with a spacer. 01/24/15  Yes Ahmed Prima, MD  allopurinol (ZYLOPRIM) 100 MG tablet TAKE 1 TABLET BY MOUTH ONCE A DAY Patient taking differently: TAKE 100mg  BY MOUTH ONCE A DAY 07/13/16  Yes [name], NP  aspirin EC 325 MG tablet Take 162.5 mg by mouth daily.   Yes [provider]  carvedilol (COREG) 12.5 MG tablet Take 1 tablet (12.5 mg total) by mouth 2 (two) times daily with a meal. 07/16/16  Yes Darylene Price A, FNP  cetirizine (ZYRTEC) 10 MG tablet Take 1 tablet (10 mg total) by mouth daily. 06/07/16  Yes Darylene Price A, FNP  fenofibrate (TRICOR) 145 MG tablet TAKE 1 TABLET BY MOUTH ONCE A DAY 01/08/17  Yes Baity, Coralie Keens, NP  fluticasone (FLONASE) 50 MCG/ACT nasal spray Place 1 spray into both nostrils 2 (two) times daily. Patient taking differently: Place 1 spray into both nostrils 2 (two) times daily as needed for allergies.  03/29/16  Yes Cuthriell, Charline Bills, PA-C  Multiple Vitamin (MULTIVITAMIN WITH MINERALS) TABS tablet Take 1 tablet by mouth daily. Men's  One a Day   Yes [provider]  naproxen (NAPROSYN) 500 MG tablet Take 500 mg by mouth 2 (two) times daily as needed for moderate pain (pain).    Yes [provider]  Omega-3 Fatty Acids (FISH OIL PO) Take 1 capsule by mouth daily.   Yes [provider]  sacubitril-valsartan (ENTRESTO) 49-51 MG Take 1 tablet by mouth 2 (two) times daily. 01/15/17  Yes Darylene Price A, FNP  spironolactone (ALDACTONE) 25 MG tablet Take 0.5 tablets (12.5 mg total) by mouth daily. 10/03/16  Yes Jama Mcmiller, Otila Kluver A, FNP  folic acid (FOLVITE) 1 MG tablet Take 1 tablet (1 mg total) by mouth daily. Patient not taking: Reported on 04/17/2017 09/20/16   Alisa Graff, FNP  predniSONE (DELTASONE) 10 MG tablet Take 3 tabs on days 1-3, take 2 tabs on days 4-6, take 1 tab on days 7-9 Patient not taking: Reported on 04/17/2017 02/13/17   [name], NP    Review of Systems  Constitutional: Positive for appetite change (decreased) and fatigue.  HENT: Negative for congestion, postnasal drip and sore throat.   Eyes: Negative.   Respiratory: Negative for cough, chest tightness and shortness of breath.   Cardiovascular: Negative for chest pain, palpitations and leg swelling.  Gastrointestinal: Negative for abdominal distention and abdominal pain.  Endocrine: Negative.   Genitourinary: Negative.   Musculoskeletal: Negative for back pain and neck pain.  Skin: Negative.   Allergic/Immunologic: Negative.   Neurological: Positive for light-headedness (when getting up too quickly). Negative for dizziness and headaches.  Hematological: Negative for adenopathy. Does not bruise/bleed easily.  Psychiatric/Behavioral: Negative for dysphoric mood, sleep disturbance (sleeping on 1 pillow) and suicidal ideas. The patient is not nervous/anxious.    Vitals:   04/17/17 1016  BP: 108/74  Pulse: (!) 110  Resp: 18  SpO2: 100%  Weight: 170 lb 4 oz (77.2 kg)  Height: 6\' 4"  (1.93 m)   Wt Readings from Last 3  Encounters:  04/17/17 170 lb 4 oz (77.2 kg)  02/13/17 177 lb (80.3 kg)  01/15/17 173 lb 4 oz (78.6 kg)   Lab Results  Component Value Date   CREATININE 1.47 (H) 11/11/2016   CREATININE 1.25 05/09/2016   CREATININE 1.42 (H) 04/09/2016    Physical Exam  Constitutional: He is oriented to person, place, and time. He appears well-developed and well-nourished.  HENT:  Head: Normocephalic and atraumatic.  Neck: Normal range of motion. Neck supple. No JVD present.  Cardiovascular: Regular rhythm. Tachycardia present.  Pulmonary/Chest: Effort normal. He has no wheezes. He has no rales.  Abdominal: Soft. He exhibits no distension. There is no tenderness.  Musculoskeletal: He exhibits no edema or tenderness.  Neurological: He is alert and oriented to person, place, and time.  Skin: Skin is warm and dry.  Psychiatric: He has a normal mood and affect. His behavior is normal. Thought content normal.  Nursing note and vitals reviewed.    Assessment & Plan:  1: Chronic heart failure with reduced ejection fraction- - NYHA class II - euvolemic today - continue weighing daily and call for an overnight weight gain of >2 pounds/weekly weight gain of >5 pounds.  - weight down 7 pounds since he was last here - not adding any salt to his food - saw cardiologist Fletcher Anon) 06/07/16 and was supposed to have been seen in 6 months. Appointment scheduled for 06/18/17 - doubt entresto can be titrated up due to BP - consider titrating up carvedilol if he remains tachycardic - patient reports receiving his flu vaccine for this season already - Pharm D reconciled medications with the patient  2: HTN- - BP ok today although on the low side - BMP from 11/11/16 reviewed and shows sodium 140, potassium 4.1 and GFR 58 - saw his PCP Garnette Gunner) 02/13/17  3: Tobacco use- - continues to smoke about 1 ppd of cigarettes & says that he smoked about 20 minutes prior to coming to the clinic - reports never quitting before  and doesn't desire to quit at this time - complete cessation discussed for 3 minutes with him  4: Hyperlipidemia- - has been taking fenofibrate for years  - he will take what he has left and then begin atorvastatin 40mg  daily  Medication bottles were reviewed.   Return in 6 months or sooner for any questions/problems before then.

## 2017-04-17 ENCOUNTER — Ambulatory Visit: Payer: Medicare Other | Attending: Family | Admitting: Family

## 2017-04-17 ENCOUNTER — Encounter: Payer: Self-pay | Admitting: Family

## 2017-04-17 VITALS — BP 108/74 | HR 110 | Resp 18 | Ht 76.0 in | Wt 170.2 lb

## 2017-04-17 DIAGNOSIS — E782 Mixed hyperlipidemia: Secondary | ICD-10-CM

## 2017-04-17 DIAGNOSIS — Z79899 Other long term (current) drug therapy: Secondary | ICD-10-CM | POA: Diagnosis not present

## 2017-04-17 DIAGNOSIS — F17210 Nicotine dependence, cigarettes, uncomplicated: Secondary | ICD-10-CM | POA: Diagnosis not present

## 2017-04-17 DIAGNOSIS — I110 Hypertensive heart disease with heart failure: Secondary | ICD-10-CM | POA: Insufficient documentation

## 2017-04-17 DIAGNOSIS — E785 Hyperlipidemia, unspecified: Secondary | ICD-10-CM | POA: Diagnosis not present

## 2017-04-17 DIAGNOSIS — Z833 Family history of diabetes mellitus: Secondary | ICD-10-CM | POA: Diagnosis not present

## 2017-04-17 DIAGNOSIS — Z7982 Long term (current) use of aspirin: Secondary | ICD-10-CM | POA: Diagnosis not present

## 2017-04-17 DIAGNOSIS — Z9889 Other specified postprocedural states: Secondary | ICD-10-CM | POA: Insufficient documentation

## 2017-04-17 DIAGNOSIS — I509 Heart failure, unspecified: Secondary | ICD-10-CM | POA: Insufficient documentation

## 2017-04-17 DIAGNOSIS — Z8249 Family history of ischemic heart disease and other diseases of the circulatory system: Secondary | ICD-10-CM | POA: Insufficient documentation

## 2017-04-17 DIAGNOSIS — Z720 Tobacco use: Secondary | ICD-10-CM

## 2017-04-17 DIAGNOSIS — I5022 Chronic systolic (congestive) heart failure: Secondary | ICD-10-CM

## 2017-04-17 DIAGNOSIS — E119 Type 2 diabetes mellitus without complications: Secondary | ICD-10-CM | POA: Diagnosis not present

## 2017-04-17 DIAGNOSIS — I10 Essential (primary) hypertension: Secondary | ICD-10-CM

## 2017-04-17 MED ORDER — ATORVASTATIN CALCIUM 40 MG PO TABS: 40.0000 mg | ORAL_TABLET | Freq: Every day | ORAL | 3 refills | Status: DC

## 2017-04-17 MED ORDER — SPIRONOLACTONE 25 MG PO TABS: 12.5000 mg | ORAL_TABLET | Freq: Every day | ORAL | 6 refills | Status: DC

## 2017-04-17 NOTE — Patient Instructions (Addendum)
Continue weighing daily and call for an overnight weight gain of > 2 pounds or a weekly weight gain of >5 pounds.  Finish taking your fenofibrate and then begin taking atorvastatin 40mg  once daily

## 2017-06-13 ENCOUNTER — Encounter: Payer: Self-pay | Admitting: Internal Medicine

## 2017-06-13 ENCOUNTER — Ambulatory Visit (INDEPENDENT_AMBULATORY_CARE_PROVIDER_SITE_OTHER): Payer: Medicare Other | Admitting: Internal Medicine

## 2017-06-13 VITALS — BP 112/74 | HR 85 | Temp 98.2°F | Wt 166.0 lb

## 2017-06-13 DIAGNOSIS — J301 Allergic rhinitis due to pollen: Secondary | ICD-10-CM | POA: Diagnosis not present

## 2017-06-13 DIAGNOSIS — R21 Rash and other nonspecific skin eruption: Secondary | ICD-10-CM | POA: Diagnosis not present

## 2017-06-13 MED ORDER — PREDNISONE 10 MG PO TABS: ORAL_TABLET | ORAL | 0 refills | Status: DC

## 2017-06-15 ENCOUNTER — Encounter: Payer: Self-pay | Admitting: Internal Medicine

## 2017-06-15 NOTE — Patient Instructions (Signed)
Allergic Rhinitis, Adult  Allergic rhinitis is an allergic reaction that affects the mucous membrane inside the nose. It causes sneezing, a runny or stuffy nose, and the feeling of mucus going down the back of the throat (postnasal drip). Allergic rhinitis can be mild to severe.  There are two types of allergic rhinitis:   Seasonal. This type is also called hay fever. It happens only during certain seasons.   Perennial. This type can happen at any time of the year.    What are the causes?  This condition happens when the body's defense system (immune system) responds to certain harmless substances called allergens as though they were germs.   Seasonal allergic rhinitis is triggered by pollen, which can come from grasses, trees, and weeds. Perennial allergic rhinitis may be caused by:   House dust mites.   Pet dander.   Mold spores.    What are the signs or symptoms?  Symptoms of this condition include:   Sneezing.   Runny or stuffy nose (nasal congestion).   Postnasal drip.   Itchy nose.   Tearing of the eyes.   Trouble sleeping.   Daytime sleepiness.    How is this diagnosed?  This condition may be diagnosed based on:   Your medical history.   A physical exam.   Tests to check for related conditions, such as:  ? Asthma.  ? Pink eye.  ? Ear infection.  ? Upper respiratory infection.   Tests to find out which allergens trigger your symptoms. These may include skin or blood tests.    How is this treated?  There is no cure for this condition, but treatment can help control symptoms. Treatment may include:   Taking medicines that block allergy symptoms, such as antihistamines. Medicine may be given as a shot, nasal spray, or pill.   Avoiding the allergen.   Desensitization. This treatment involves getting ongoing shots until your body becomes less sensitive to the allergen. This treatment may be done if other treatments do not help.   If taking medicine and avoiding the allergen does not work, new,  stronger medicines may be prescribed.    Follow these instructions at home:   Find out what you are allergic to. Common allergens include smoke, dust, and pollen.   Avoid the things you are allergic to. These are some things you can do to help avoid allergens:  ? Replace carpet with wood, tile, or vinyl flooring. Carpet can trap dander and dust.  ? Do not smoke. Do not allow smoking in your home.  ? Change your heating and air conditioning filter at least once a month.  ? During allergy season:   Keep windows closed as much as possible.   Plan outdoor activities when pollen counts are lowest. This is usually during the evening hours.   When coming indoors, change clothing and shower before sitting on furniture or bedding.   Take over-the-counter and prescription medicines only as told by your health care provider.   Keep all follow-up visits as told by your health care provider. This is important.  Contact a health care provider if:   You have a fever.   You develop a persistent cough.   You make whistling sounds when you breathe (you wheeze).   Your symptoms interfere with your normal daily activities.  Get help right away if:   You have shortness of breath.  Summary   This condition can be managed by taking medicines as directed and avoiding   allergens.   Contact your health care provider if you develop a persistent cough or fever.   During allergy season, keep windows closed as much as possible.  This information is not intended to replace advice given to you by your health care provider. Make sure you discuss any questions you have with your health care provider.  Document Released: 12/05/2000 Document Revised: 04/19/2016 Document Reviewed: 04/19/2016  Elsevier Interactive Patient Education  2018 [employer]

## 2017-06-15 NOTE — Progress Notes (Signed)
Subjective:    Patient ID: Travis [name], male    DOB: 1956-01-30, 62 y.o.   MRN: [id number]  HPI  Pt presents to the clinic today with c/o runny nose and cough. This started 1 week ago. He is blowing clear mucous out of his nose. The cough is productive of clear mucous. The cough is worse at night. He denies fever, chills or body aches. She has tried Mucinex and Robitussin with some relief. He has not had sick contacts that he is aware of.   He would also like a referral to dermatology. He has had a rash for many months. The rash is all over his arms and legs. The rash is itchy. He has tried lotions and oatmeal soap without any relief.  Review of Systems      Past Medical History:  Diagnosis Date  . Alcohol abuse   . 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 Allergy   . Asthma   . 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 Chronic anemia   . Chronic systolic CHF (congestive heart failure) ([place])    a. EF 20-25% by echo 12/28/10; b improved to 45-50% on echo in June 2015  . 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 Clotting disorder ([place])   . Coronary artery disease    Cardiac catheterization in November 2016 showed mild 30% proximal LAD stenosis with no evidence of obstructive disease.  . Diabetes mellitus, type 2 ([place])    pt reports his DM is gone  . GI bleed    15 years ago  . Gout   . Hemoptysis    secondary to pulmonary edema  . Hyperlipidemia   . Hypertension   . Nonischemic cardiomyopathy ([place])    minimal coronary disease by cath 12/29/10  . Osteoarthritis   . Pneumonia   . Tobacco abuse   . Vision loss    peripherial vision only left eye.[place]  artery occusion     Current Outpatient Medications  Medication Sig Dispense Refill  . albuterol (PROVENTIL HFA;VENTOLIN HFA) 108 (90 BASE) MCG/ACT inhaler Inhale 2 puffs into the lungs every 6 (six) hours as needed for wheezing or shortness of breath.  Take 2 puffs every 5-10 minutes up to 6 puffs total over 15 minutes when needed.  Use with a spacer. (Patient taking differently: Inhale 2 puffs into the lungs every 6 (six) hours as needed for wheezing or shortness of breath. Take 2 puffs up to 6 puffs total over 15 minutes when needed.  Use with a spacer.) 1 Inhaler 2  . allopurinol (ZYLOPRIM) 100 MG tablet TAKE 1 TABLET BY MOUTH ONCE A DAY (Patient taking differently: TAKE 100mg  BY MOUTH ONCE A DAY) 90 tablet 3  . aspirin EC 325 MG tablet Take 162.5 mg by mouth daily.    [name] atorvastatin (LIPITOR) 40 MG tablet Take 1 tablet (40 mg total) by mouth daily. 90 tablet 3  . carvedilol (COREG) 12.5 MG tablet Take 1 tablet (12.5 mg total) by mouth 2 (two) times daily with a meal. 60 tablet 5  . cetirizine (ZYRTEC) 10 MG tablet Take 1 tablet (10 mg total) by mouth daily. 30 tablet 11  . fluticasone (FLONASE) 50 MCG/ACT nasal spray Place 1 spray into both nostrils 2 (two) times daily. (Patient taking differently: Place 1 spray into both nostrils 2 (two) times daily as needed for allergies. ) 16 g 0  . folic acid (FOLVITE) 1 MG  tablet Take 1 tablet (1 mg total) by mouth daily. 30 tablet 3  . Multiple Vitamin (MULTIVITAMIN WITH MINERALS) TABS tablet Take 1 tablet by mouth daily. Men's One a Day    . naproxen (NAPROSYN) 500 MG tablet Take 500 mg by mouth 2 (two) times daily as needed for moderate pain (pain).     . Omega-3 Fatty Acids (FISH OIL PO) Take 1 capsule by mouth daily.    . sacubitril-valsartan (ENTRESTO) 49-51 MG Take 1 tablet by mouth 2 (two) times daily. 60 tablet 5  . spironolactone (ALDACTONE) 25 MG tablet Take 0.5 tablets (12.5 mg total) by mouth daily. 15 tablet 6  . predniSONE (DELTASONE) 10 MG tablet Take 3 tabs on days 1-3, take 2 tabs on days 4-6, take 1 tab on days 7-9 18 tablet 0   No current facility-administered medications for this visit.     Allergies  Allergen Reactions  . Tramadol Itching and Rash    Family History  Problem  Relation Age of Onset  . Diabetes Mother   . Hypertension Mother   . Diabetes Father   . Hypertension Father   . Diabetes Sister   . Dementia Brother   . Cancer Neg Hx   . Heart disease Neg Hx   . Stroke Neg Hx   . Colon cancer Neg Hx   . Esophageal cancer Neg Hx   . Rectal cancer Neg Hx   . Stomach cancer Neg Hx     Social History   Socioeconomic History  . Marital status: Single    Spouse name: Not on file  . Number of children: 0  . Years of education: Not on file  . Highest education level: High school graduate  Occupational History  . Occupation: retired  Scientific laboratory technician  . Financial resource strain: Not hard at all  . Food insecurity:    Worry: Sometimes true    Inability: Never true  . Transportation needs:    Medical: No    Non-medical: No  Tobacco Use  . Smoking status: Current Every Day Smoker    Packs/day: 0.50    Years: 30.00    Pack years: 15.00    Types: Cigarettes  . Smokeless tobacco: Never Used  Substance and Sexual Activity  . Alcohol use: Yes    Alcohol/week: 0.6 oz    Types: 1 Glasses of wine per week    Comment: occasionally  . Drug use: Yes    Types: Marijuana    Comment: 1 blunt 1-2 times a month- 08/29/15/17- "last week" 04/17/17-2 weeks ago  . Sexual activity: Yes    Birth control/protection: Condom  Lifestyle  . Physical activity:    Days per week: 0 days    Minutes per session: 0 min  . Stress: Only a little  Relationships  . Social connections:    Talks on phone: Three times a week    Gets together: Once a week    Attends religious service: More than 4 times per year    Active member of club or organization: Yes    Attends meetings of clubs or organizations: More than 4 times per year    Relationship status: Never married  . Intimate partner violence:    Fear of current or ex partner: Patient refused    Emotionally abused: Patient refused    Physically abused: Patient refused    Forced sexual activity: Patient refused  Other  Topics Concern  . Not on file  Social History Narrative  .  Not on file     Constitutional: Denies fever, malaise, fatigue, headache or abrupt weight changes.  HEENT: Pt reports runny nose. Denies eye pain, eye redness, ear pain, ringing in the ears, wax buildup,  nasal congestion, bloody nose, or sore throat. Respiratory: Pt reports cough. Denies difficulty breathing, shortness of breath, or sputum production.   Cardiovascular: Denies chest pain, chest tightness, palpitations or swelling in the hands or feet.  Skin: Pt reports rash. Denies redness lesions or ulcercations.    No other specific complaints in a complete review of systems (except as listed in HPI above).  Objective:   Physical Exam  BP 112/74   Pulse 85   Temp 98.2 F (36.8 C) (Oral)   Wt 166 lb (75.3 kg)   SpO2 99%   BMI 20.21 kg/m  Wt Readings from Last 3 Encounters:  06/13/17 166 lb (75.3 kg)  04/17/17 170 lb 4 oz (77.2 kg)  02/13/17 177 lb (80.3 kg)    General: Appears older than his stated age, chronically ill appearing in NAD. Skin: Excoriation noted on bilateral upper arms. HEENT: Head: normal shape and size, no sinus tenderness noted; Ears: Tm's gray and intact, normal light reflex; Nose: mucosa pink and moist, septum midline; Throat/Mouth: Teeth present, mucosa erythematous and moist, + PND, no exudate, lesions or ulcerations noted.  Neck:  No adenopathy noted. Pulmonary/Chest: Normal effort and positive vesicular breath sounds. No respiratory distress. No wheezes, rales or ronchi noted.   BMET    Component Value Date/Time   NA 140 11/11/2016 1202   NA 140 05/23/2015 1038   NA 141 04/20/2014 1019   K 4.1 11/11/2016 1202   K 3.7 04/20/2014 1019   CL 105 11/11/2016 1202   CL 106 04/20/2014 1019   CO2 26 11/11/2016 1202   CO2 30 04/20/2014 1019   GLUCOSE 103 (H) 11/11/2016 1202   GLUCOSE 102 (H) 04/20/2014 1019   BUN 29 (H) 11/11/2016 1202   BUN 18 05/23/2015 1038   BUN 14 04/20/2014 1019    CREATININE 1.47 (H) 11/11/2016 1202   CREATININE 1.18 04/20/2014 1019   CALCIUM 9.3 11/11/2016 1202   CALCIUM 9.3 04/20/2014 1019   GFRNONAA 50 (L) 11/11/2016 1202   GFRNONAA >60 04/20/2014 1019   GFRNONAA >60 11/19/2012 1107   GFRAA 58 (L) 11/11/2016 1202   GFRAA >60 04/20/2014 1019   GFRAA >60 11/19/2012 1107    Lipid Panel     Component Value Date/Time   CHOL 197 08/21/2016 1301   TRIG 231.0 (H) 08/21/2016 1301   HDL 67.40 08/21/2016 1301   CHOLHDL 3 08/21/2016 1301   VLDL 46.2 (H) 08/21/2016 1301   LDLCALC UNABLE TO CALCULATE IF TRIGLYCERIDE OVER 400 mg/dL 09/14/2013 0510    CBC    Component Value Date/Time   WBC 3.6 (L) 11/11/2016 1202   RBC 3.83 (L) 11/11/2016 1202   HGB 11.9 (L) 11/11/2016 1202   HGB 13.5 04/20/2014 1019   HCT 35.9 (L) 11/11/2016 1202   HCT 41.8 04/20/2014 1019   PLT 230 11/11/2016 1202   PLT 348 04/20/2014 1019   MCV 93.7 11/11/2016 1202   MCV 90 04/20/2014 1019   MCH 31.1 11/11/2016 1202   MCHC 33.1 11/11/2016 1202   RDW 14.5 11/11/2016 1202   RDW 16.2 (H) 04/20/2014 1019   LYMPHSABS 1.9 09/06/2015 1632   LYMPHSABS 2.1 04/20/2014 1019   MONOABS 0.4 09/06/2015 1632   MONOABS 0.5 04/20/2014 1019   EOSABS 1.3 (H) 09/06/2015 1632  EOSABS 0.3 04/20/2014 1019   BASOSABS 0.0 09/06/2015 1632   BASOSABS 0.1 04/20/2014 1019   BASOSABS 0 06/13/2012 1515    Hgb A1C Lab Results  Component Value Date   HGBA1C 6.3 05/09/2016            Assessment & Plan:   Allergic Rhinitis:  Start Claritin and Flonase OTC eRx for Pred Taper x 9 days  Rash:  eRx for Pred Taper x 9 days Referral placed to dermatology per patient request  Return precautions discussed [name], NP

## 2017-06-18 ENCOUNTER — Encounter: Payer: Self-pay | Admitting: Cardiovascular Disease

## 2017-06-18 ENCOUNTER — Ambulatory Visit (INDEPENDENT_AMBULATORY_CARE_PROVIDER_SITE_OTHER): Payer: Medicare Other | Admitting: Cardiovascular Disease

## 2017-06-18 ENCOUNTER — Telehealth: Payer: Self-pay

## 2017-06-18 VITALS — BP 110/70 | HR 79 | Ht 76.0 in | Wt 174.8 lb

## 2017-06-18 DIAGNOSIS — Z720 Tobacco use: Secondary | ICD-10-CM | POA: Diagnosis not present

## 2017-06-18 DIAGNOSIS — E7800 Pure hypercholesterolemia, unspecified: Secondary | ICD-10-CM

## 2017-06-18 DIAGNOSIS — I5022 Chronic systolic (congestive) heart failure: Secondary | ICD-10-CM

## 2017-06-18 DIAGNOSIS — I10 Essential (primary) hypertension: Secondary | ICD-10-CM

## 2017-06-18 NOTE — Progress Notes (Signed)
Cardiology Office Note   Date:  06/18/2017   ID:  Travis [name], DOB [birth year], MRN [id number]  PCP:  [name], NP  Cardiologist:   Kathlyn Sacramento, MD   Chief Complaint  Patient presents with  . OTHER    OD 6 month f/u no complaints today. Meds reviewed verbally with pt.      History of Present Illness: Travis [name] is a 62 y.o. 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 He has known history of mild nonobstructive one-vessel coronary artery disease, history of TIA, tobacco use, previous excessive alcohol use and hyperlipidemia. Echocardiogram in November 2016 showed worsening LV systolic dysfunction with an ejection fraction of 25-30%. Due to that, he underwent cardiac catheterization which showed mild nonobstructive one-vessel coronary artery disease. LVEDP was mildly elevated.  Most recent echocardiogram in May 2018 showed an EF of 35-40% with grade 1 diastolic dysfunction.  He has been doing very well with no chest pain, shortness of breath or palpitations.  He takes his medications regularly.  He reports that he drinks alcohol only socially now and not on a daily basis.  He continues to smoke half a pack per day and has not been able to quit.    Past Medical History:  Diagnosis Date  . Alcohol abuse   . 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 Allergy   . Asthma   . 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 Chronic anemia   . Chronic systolic CHF (congestive heart failure) ([place])    a. EF 20-25% by echo 12/28/10; b improved to 45-50% on echo in June 2015  . 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 Clotting disorder ([place])   . Coronary artery disease    Cardiac catheterization in November 2016 showed mild 30% proximal LAD stenosis with no evidence of obstructive disease.  . Diabetes mellitus, type 2 ([place])      pt reports his DM is gone  . GI bleed    15 years ago  . Gout   . Hemoptysis    secondary to pulmonary edema  . Hyperlipidemia   . Hypertension   . Nonischemic cardiomyopathy ([place])    minimal coronary disease by cath 12/29/10  . Osteoarthritis   . Pneumonia   . Tobacco abuse   . Vision loss    peripherial vision only left eye.[place]  artery occusion     Past Surgical History:  Procedure Laterality Date  . CARDIAC CATHETERIZATION    . CARDIAC CATHETERIZATION N/A 01/28/2015   Procedure: Left Heart Cath and Coronary Angiography;  Surgeon: Wellington Hampshire, MD;  Location: [facility];  Service: Cardiovascular;  Laterality: N/A;  . COLONOSCOPY    . HIP ARTHROPLASTY Right 03/15/2013   Procedure: ARTHROPLASTY BIPOLAR HIP;  Surgeon: [name], MD;  Location: [facility];  Service: Orthopedics;  Laterality: Right;  . TOTAL SHOULDER ARTHROPLASTY Right 09/01/2015   Procedure: RIGHT TOTAL SHOULDER ARTHROPLASTY;  Surgeon: [name], MD;  Location: [facility];  Service: Orthopedics;  Laterality: Right;     Current Outpatient Medications  Medication Sig Dispense Refill  . albuterol (PROVENTIL HFA;VENTOLIN HFA) 108 (90 BASE) MCG/ACT inhaler Inhale 2 puffs into the lungs every 6 (six) hours as needed for wheezing or shortness of breath. Take 2 puffs every 5-10 minutes up to 6 puffs total over 15 minutes when  needed.  Use with a spacer. (Patient taking differently: Inhale 2 puffs into the lungs every 6 (six) hours as needed for wheezing or shortness of breath. Take 2 puffs up to 6 puffs total over 15 minutes when needed.  Use with a spacer.) 1 Inhaler 2  . allopurinol (ZYLOPRIM) 100 MG tablet TAKE 1 TABLET BY MOUTH ONCE A DAY (Patient taking differently: TAKE 100mg  BY MOUTH ONCE A DAY) 90 tablet 3  . aspirin EC 325 MG tablet Take 162.5 mg by mouth daily.    [name] atorvastatin (LIPITOR) 40 MG tablet Take 1 tablet (40 mg total) by mouth daily. 90 tablet 3  . carvedilol (COREG) 12.5  MG tablet Take 1 tablet (12.5 mg total) by mouth 2 (two) times daily with a meal. 60 tablet 5  . cetirizine (ZYRTEC) 10 MG tablet Take 1 tablet (10 mg total) by mouth daily. 30 tablet 11  . fluticasone (FLONASE) 50 MCG/ACT nasal spray Place 1 spray into both nostrils 2 (two) times daily. (Patient taking differently: Place 1 spray into both nostrils 2 (two) times daily as needed for allergies. ) 16 g 0  . folic acid (FOLVITE) 1 MG tablet Take 1 tablet (1 mg total) by mouth daily. 30 tablet 3  . Multiple Vitamin (MULTIVITAMIN WITH MINERALS) TABS tablet Take 1 tablet by mouth daily. Men's One a Day    . naproxen (NAPROSYN) 500 MG tablet Take 500 mg by mouth 2 (two) times daily as needed for moderate pain (pain).     . Omega-3 Fatty Acids (FISH OIL PO) Take 1 capsule by mouth daily.    . predniSONE (DELTASONE) 10 MG tablet Take 3 tabs on days 1-3, take 2 tabs on days 4-6, take 1 tab on days 7-9 18 tablet 0  . sacubitril-valsartan (ENTRESTO) 49-51 MG Take 1 tablet by mouth 2 (two) times daily. 60 tablet 5  . spironolactone (ALDACTONE) 25 MG tablet Take 0.5 tablets (12.5 mg total) by mouth daily. 15 tablet 6   No current facility-administered medications for this visit.     Allergies:   Tramadol    Social History:  The patient  reports that he has been smoking cigarettes.  He has a 15.00 pack-year smoking history. He has never used smokeless tobacco. He reports that he drinks about 0.6 oz of alcohol per week. He reports that he has current or past drug history. Drug: Marijuana.   Family History:  The patient's family history includes Dementia in his brother; Diabetes in his father, mother, and sister; Hypertension in his father and mother.    ROS:  Please see the history of present illness.   Otherwise, review of systems are positive for none.   All other systems are reviewed and negative.    PHYSICAL EXAM: VS:  BP 110/70 (BP Location: Left Arm, Patient Position: Sitting, Cuff Size: Normal)    Pulse 79   Ht 6\' 4"  (1.93 m)   Wt 174 lb 12 oz (79.3 kg)   BMI 21.27 kg/m  , BMI Body mass index is 21.27 kg/m. GEN: Well nourished, well developed, in no acute distress  HEENT: normal  Neck: no JVD, carotid bruits, or masses Cardiac: RRR; no murmurs, rubs, or gallops,no edema  Respiratory:  clear to auscultation bilaterally, normal work of breathing GI: soft, nontender, nondistended, + BS MS: no deformity or atrophy  Skin: warm and dry, no rash Neuro:  Strength and sensation are intact Psych: euthymic mood, full affect   EKG:  EKG is  ordered today. EKG showed normal sinus rhythm with sinus arrhythmia, left ventricular hypertrophy with repolarization abnormalities.    Recent Labs: 11/11/2016: ALT 16; BUN 29; Creatinine, Ser 1.47; Hemoglobin 11.9; Platelets 230; Potassium 4.1; Sodium 140    Lipid Panel    Component Value Date/Time   CHOL 197 08/21/2016 1301   TRIG 231.0 (H) 08/21/2016 1301   HDL 67.40 08/21/2016 1301   CHOLHDL 3 08/21/2016 1301   VLDL 46.2 (H) 08/21/2016 1301   LDLCALC UNABLE TO CALCULATE IF TRIGLYCERIDE OVER 400 mg/dL 09/14/2013 0510   LDLDIRECT 78.0 08/21/2016 1301      Wt Readings from Last 3 Encounters:  06/18/17 174 lb 12 oz (79.3 kg)  06/13/17 166 lb (75.3 kg)  04/17/17 170 lb 4 oz (77.2 kg)       ASSESSMENT AND PLAN: MN  1.   Chronic systolic heart failure: Due to suspected alcohol-induced cardiomyopathy.  He is on optimal medical therapy and appears to be euvolemic without a loop diuretic. Currently New York Heart Association class II.  Most recent ejection fraction was 35-40%. I requested basic metabolic profile given that he is on spironolactone and Entresto.  2. Essential hypertension: Blood pressure is controlled on current medications.  3. Tobacco use: He continues to smoke and has not been able to quit smoking.   4.  Hyperlipidemia: Currently on atorvastatin 40 mg daily.  I requested lipid and liver profile.    Disposition:    FU with me in 6 months  Signed,  Kathlyn Sacramento, MD  06/18/2017 2:31 PM    [place]

## 2017-06-18 NOTE — Telephone Encounter (Signed)
Left message for patient to call Aftyn Nott back in regards to a referral-Emersen Mascari V Emelda Kohlbeck, RMA   

## 2017-06-18 NOTE — Patient Instructions (Signed)
Medication Instructions:  Your physician recommends that you continue on your current medications as directed. Please refer to the Current Medication list given to you today.   Labwork: BMET, lipid, liver profile  Testing/Procedures: none  Follow-Up: Your physician wants you to follow-up in: 6 months with Dr. Fletcher Anon.  You will receive a reminder letter in the mail two months in advance. If you don't receive a letter, please call our office to schedule the follow-up appointment.   Any Other Special Instructions Will Be Listed Below (If Applicable).     If you need a refill on your cardiac medications before your next appointment, please call your pharmacy.

## 2017-06-19 ENCOUNTER — Other Ambulatory Visit: Payer: Self-pay

## 2017-06-19 LAB — BASIC METABOLIC PANEL
BUN / CREAT RATIO: 16 (ref 10–24)
BUN: 16 mg/dL (ref 8–27)
CO2: 25 mmol/L (ref 20–29)
CREATININE: 0.99 mg/dL (ref 0.76–1.27)
Calcium: 10 mg/dL (ref 8.6–10.2)
Chloride: 100 mmol/L (ref 96–106)
GFR calc non Af Amer: 82 mL/min/{1.73_m2} (ref >59)
GFR, EST AFRICAN AMERICAN: 95 mL/min/{1.73_m2} (ref >59)
Glucose: 157 mg/dL — ABNORMAL HIGH (ref 65–99)
Potassium: 4.4 mmol/L (ref 3.5–5.2)
Sodium: 141 mmol/L (ref 134–144)

## 2017-06-19 LAB — HEPATIC FUNCTION PANEL
ALT: 79 IU/L — ABNORMAL HIGH (ref 0–44)
AST: 56 IU/L — ABNORMAL HIGH (ref 0–40)
Albumin: 4 g/dL (ref 3.6–4.8)
Alkaline Phosphatase: 139 IU/L — ABNORMAL HIGH (ref 39–117)
Bilirubin Total: 0.4 mg/dL (ref 0.0–1.2)
Bilirubin, Direct: 0.21 mg/dL (ref 0.00–0.40)
TOTAL PROTEIN: 6.5 g/dL (ref 6.0–8.5)

## 2017-06-19 LAB — LIPID PANEL
CHOL/HDL RATIO: 2.1 ratio (ref 0.0–5.0)
Cholesterol, Total: 138 mg/dL (ref 100–199)
HDL: 67 mg/dL (ref >39)
LDL CALC: 51 mg/dL (ref 0–99)
Triglycerides: 101 mg/dL (ref 0–149)
VLDL Cholesterol Cal: 20 mg/dL (ref 5–40)

## 2017-06-19 MED ORDER — ATORVASTATIN CALCIUM 20 MG PO TABS: 20.0000 mg | ORAL_TABLET | Freq: Every day | ORAL | 3 refills | Status: DC

## 2017-06-20 DIAGNOSIS — L299 Pruritus, unspecified: Secondary | ICD-10-CM | POA: Diagnosis not present

## 2017-06-20 DIAGNOSIS — L209 Atopic dermatitis, unspecified: Secondary | ICD-10-CM | POA: Diagnosis not present

## 2017-07-26 ENCOUNTER — Ambulatory Visit: Payer: Medicare Other | Admitting: Internal Medicine

## 2017-07-30 ENCOUNTER — Ambulatory Visit (INDEPENDENT_AMBULATORY_CARE_PROVIDER_SITE_OTHER): Payer: Medicare Other | Admitting: Internal Medicine

## 2017-07-30 ENCOUNTER — Encounter: Payer: Self-pay | Admitting: Internal Medicine

## 2017-07-30 VITALS — BP 118/76 | HR 79 | Temp 98.4°F | Ht 73.5 in | Wt 163.0 lb

## 2017-07-30 DIAGNOSIS — E119 Type 2 diabetes mellitus without complications: Secondary | ICD-10-CM

## 2017-07-30 DIAGNOSIS — M8949 Other hypertrophic osteoarthropathy, multiple sites: Secondary | ICD-10-CM

## 2017-07-30 DIAGNOSIS — Z125 Encounter for screening for malignant neoplasm of prostate: Secondary | ICD-10-CM

## 2017-07-30 DIAGNOSIS — J4520 Mild intermittent asthma, uncomplicated: Secondary | ICD-10-CM | POA: Diagnosis not present

## 2017-07-30 DIAGNOSIS — M150 Primary generalized (osteo)arthritis: Secondary | ICD-10-CM | POA: Diagnosis not present

## 2017-07-30 DIAGNOSIS — E782 Mixed hyperlipidemia: Secondary | ICD-10-CM

## 2017-07-30 DIAGNOSIS — I5022 Chronic systolic (congestive) heart failure: Secondary | ICD-10-CM

## 2017-07-30 DIAGNOSIS — I10 Essential (primary) hypertension: Secondary | ICD-10-CM | POA: Diagnosis not present

## 2017-07-30 DIAGNOSIS — M1A00X0 Idiopathic chronic gout, unspecified site, without tophus (tophi): Secondary | ICD-10-CM

## 2017-07-30 DIAGNOSIS — I428 Other cardiomyopathies: Secondary | ICD-10-CM | POA: Diagnosis not present

## 2017-07-30 DIAGNOSIS — Z23 Encounter for immunization: Secondary | ICD-10-CM

## 2017-07-30 DIAGNOSIS — Z0000 Encounter for general adult medical examination without abnormal findings: Secondary | ICD-10-CM | POA: Diagnosis not present

## 2017-07-30 DIAGNOSIS — M159 Polyosteoarthritis, unspecified: Secondary | ICD-10-CM

## 2017-07-30 LAB — LIPID PANEL
Cholesterol: 184 mg/dL (ref 0–200)
HDL: 63.2 mg/dL (ref >39.00)
Total CHOL/HDL Ratio: 3
Triglycerides: 702 mg/dL — ABNORMAL HIGH (ref 0.0–149.0)

## 2017-07-30 LAB — CBC
HCT: 36.3 % — ABNORMAL LOW (ref 39.0–52.0)
HEMOGLOBIN: 12.2 g/dL — AB (ref 13.0–17.0)
MCHC: 33.7 g/dL (ref 30.0–36.0)
MCV: 100.9 fl — ABNORMAL HIGH (ref 78.0–100.0)
PLATELETS: 191 10*3/uL (ref 150.0–400.0)
RBC: 3.6 Mil/uL — AB (ref 4.22–5.81)
RDW: 15.6 % — AB (ref 11.5–15.5)
WBC: 3.3 10*3/uL — ABNORMAL LOW (ref 4.0–10.5)

## 2017-07-30 LAB — COMPREHENSIVE METABOLIC PANEL
ALT: 63 U/L — ABNORMAL HIGH (ref 0–53)
AST: 109 U/L — ABNORMAL HIGH (ref 0–37)
Albumin: 4.3 g/dL (ref 3.5–5.2)
Alkaline Phosphatase: 87 U/L (ref 39–117)
BUN: 28 mg/dL — AB (ref 6–23)
CHLORIDE: 100 meq/L (ref 96–112)
CO2: 27 meq/L (ref 19–32)
Calcium: 9.8 mg/dL (ref 8.4–10.5)
Creatinine, Ser: 1.39 mg/dL (ref 0.40–1.50)
GFR: 66.69 mL/min (ref >60.00)
GLUCOSE: 98 mg/dL (ref 70–99)
Potassium: 5.5 mEq/L — ABNORMAL HIGH (ref 3.5–5.1)
SODIUM: 137 meq/L (ref 135–145)
TOTAL PROTEIN: 7 g/dL (ref 6.0–8.3)
Total Bilirubin: 0.5 mg/dL (ref 0.2–1.2)

## 2017-07-30 LAB — PSA, MEDICARE: PSA: 0.91 ng/ml (ref 0.10–4.00)

## 2017-07-30 LAB — LDL CHOLESTEROL, DIRECT: LDL DIRECT: 29 mg/dL

## 2017-07-30 LAB — HEMOGLOBIN A1C: HEMOGLOBIN A1C: 5.9 % (ref 4.6–6.5)

## 2017-07-30 LAB — URIC ACID: URIC ACID, SERUM: 9.3 mg/dL — AB (ref 4.0–7.8)

## 2017-07-30 NOTE — Assessment & Plan Note (Addendum)
Compensated CBC and CMET Continue Entresto, Carvedilol, Aspirin and Spironolactone He will continue to follow with Dr. Fletcher Anon

## 2017-07-30 NOTE — Assessment & Plan Note (Signed)
Controlled on Entresto, Carvedilol, Aspirin and Spironolactone CBC and CMET today

## 2017-07-30 NOTE — Assessment & Plan Note (Signed)
Uric acid level today Continue Allopurinol, will adjust dose if necessary based on labs

## 2017-07-30 NOTE — Patient Instructions (Signed)

## 2017-07-30 NOTE — Assessment & Plan Note (Signed)
CMET and lipid profile today Encouraged him to consume a low fat diet 

## 2017-07-30 NOTE — Progress Notes (Signed)
HPI:  Patient presents to the clinic today for his Medicare Wellness Exam.  He is also due for follow-up of chronic conditions.  Cardiomyopathy: He follows with Dr. Fletcher Anon.  He is taking Entresto, Carvedilol, Aspirin and Spironolactone as prescribed.  EKG from 05/2017 reviewed an echo from 07/2016 reviewed.  Asthma, Mild Intermittent: He denies chronic cough or shortness of breath.  He uses has Albuterol inhaler rarely.  CHF: He denies lower extremity edema.  He is taking Entresto, Carvedilol, Aspirin and Spironolactone as prescribed.  He follows with Dr. Fletcher Anon.  EKG from 05/2017 reviewed an echo from 07/2016 reviewed.  DM 2: His last A1c was 6.3%, 04/2016.  He is not taking any diabetic medication at this time.  He does not check his sugars.  He does not check his feet.  His last eye exam was 06/2015.  Gout: His last flare was about 1 year ago.  He is taking Allopurinol as prescribed.  He is due to have his uric acid level repeated today.  HTN: His BP today is 118/76.  He is taking Entresto, Carvedilol and Spironolactone as prescribed.  HLD with CAD: His last LDL was 63, 04/2016.  He is taking fish oil but no cholesterol-lowering medications at this time.  He does not consume a low-fat diet.  OA: Mainly in his shoulder, back and knees.  He takes Tylenol as needed with good relief.  Past Medical History:  Diagnosis Date  . Alcohol abuse   . 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 Allergy   . Asthma   . 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 Chronic anemia   . Chronic systolic CHF (congestive heart failure) ([place])    a. EF 20-25% by echo 12/28/10; b improved to 45-50% on echo in June 2015  . 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 Clotting disorder ([place])   . Coronary artery disease    Cardiac catheterization in November 2016 showed mild 30% proximal LAD stenosis with no evidence of obstructive disease.  . Diabetes mellitus,  type 2 ([place])    pt reports his DM is gone  . GI bleed    15 years ago  . Gout   . Hemoptysis    secondary to pulmonary edema  . Hyperlipidemia   . Hypertension   . Nonischemic cardiomyopathy ([place])    minimal coronary disease by cath 12/29/10  . Osteoarthritis   . Pneumonia   . Tobacco abuse   . Vision loss    peripherial vision only left eye.[place]  artery occusion     Current Outpatient Medications  Medication Sig Dispense Refill  . albuterol (PROVENTIL HFA;VENTOLIN HFA) 108 (90 BASE) MCG/ACT inhaler Inhale 2 puffs into the lungs every 6 (six) hours as needed for wheezing or shortness of breath. Take 2 puffs every 5-10 minutes up to 6 puffs total over 15 minutes when needed.  Use with a spacer. (Patient taking differently: Inhale 2 puffs into the lungs every 6 (six) hours as needed for wheezing or shortness of breath. Take 2 puffs up to 6 puffs total over 15 minutes when needed.  Use with a spacer.) 1 Inhaler 2  . allopurinol (ZYLOPRIM) 100 MG tablet TAKE 1 TABLET BY MOUTH ONCE A DAY (Patient taking differently: TAKE 100mg  BY MOUTH ONCE A DAY) 90 tablet 3  . aspirin EC 325 MG tablet Take 162.5 mg by mouth daily.    [name] atorvastatin (LIPITOR) 20 MG tablet Take 1  tablet (20 mg total) by mouth daily. 90 tablet 3  . carvedilol (COREG) 12.5 MG tablet Take 1 tablet (12.5 mg total) by mouth 2 (two) times daily with a meal. 60 tablet 5  . cetirizine (ZYRTEC) 10 MG tablet Take 1 tablet (10 mg total) by mouth daily. 30 tablet 11  . fluticasone (FLONASE) 50 MCG/ACT nasal spray Place 1 spray into both nostrils 2 (two) times daily. (Patient taking differently: Place 1 spray into both nostrils 2 (two) times daily as needed for allergies. ) 16 g 0  . folic acid (FOLVITE) 1 MG tablet Take 1 tablet (1 mg total) by mouth daily. 30 tablet 3  . Multiple Vitamin (MULTIVITAMIN WITH MINERALS) TABS tablet Take 1 tablet by mouth daily. Men's One a Day    . naproxen (NAPROSYN) 500 MG tablet Take 500 mg by  mouth 2 (two) times daily as needed for moderate pain (pain).     . Omega-3 Fatty Acids (FISH OIL PO) Take 1 capsule by mouth daily.    . predniSONE (DELTASONE) 10 MG tablet Take 3 tabs on days 1-3, take 2 tabs on days 4-6, take 1 tab on days 7-9 18 tablet 0  . sacubitril-valsartan (ENTRESTO) 49-51 MG Take 1 tablet by mouth 2 (two) times daily. 60 tablet 5  . spironolactone (ALDACTONE) 25 MG tablet Take 0.5 tablets (12.5 mg total) by mouth daily. 15 tablet 6   No current facility-administered medications for this visit.     Allergies  Allergen Reactions  . Tramadol Itching and Rash    Family History  Problem Relation Age of Onset  . Diabetes Mother   . Hypertension Mother   . Diabetes Father   . Hypertension Father   . Diabetes Sister   . Dementia Brother   . Cancer Neg Hx   . Heart disease Neg Hx   . Stroke Neg Hx   . Colon cancer Neg Hx   . Esophageal cancer Neg Hx   . Rectal cancer Neg Hx   . Stomach cancer Neg Hx     Social History   Socioeconomic History  . Marital status: Single    Spouse name: Not on file  . Number of children: 0  . Years of education: Not on file  . Highest education level: High school graduate  Occupational History  . Occupation: retired  Scientific laboratory technician  . Financial resource strain: Not hard at all  . Food insecurity:    Worry: Sometimes true    Inability: Never true  . Transportation needs:    Medical: No    Non-medical: No  Tobacco Use  . Smoking status: Current Every Day Smoker    Packs/day: 0.50    Years: 30.00    Pack years: 15.00    Types: Cigarettes  . Smokeless tobacco: Never Used  Substance and Sexual Activity  . Alcohol use: Yes    Alcohol/week: 0.6 oz    Types: 1 Glasses of wine per week    Comment: occasionally  . Drug use: Yes    Types: Marijuana    Comment: 1 blunt 1-2 times a month- 08/29/15/17- "last week" 04/17/17-2 weeks ago  . Sexual activity: Yes    Birth control/protection: Condom  Lifestyle  . Physical  activity:    Days per week: 0 days    Minutes per session: 0 min  . Stress: Only a little  Relationships  . Social connections:    Talks on phone: Three times a week    Gets  together: Once a week    Attends religious service: More than 4 times per year    Active member of club or organization: Yes    Attends meetings of clubs or organizations: More than 4 times per year    Relationship status: Never married  . Intimate partner violence:    Fear of current or ex partner: Patient refused    Emotionally abused: Patient refused    Physically abused: Patient refused    Forced sexual activity: Patient refused  Other Topics Concern  . Not on file  Social History Narrative  . Not on file    Hospitiliaztions: None  Health Maintenance:    Flu: 12/2016  Tetanus: 10/2016  Pneumovax: 02/2013  Zostavax: never  PSA: 04/2016  Colon Screening: 08/2016  Eye Doctor: 06/2015  Dental Exam: as needed   Providers:   PCP: [name], NP-C  Cardiologist: Dr. Fletcher Anon    I have personally reviewed and have noted:  1. The patient's medical and social history 2. Their use of alcohol, tobacco or illicit drugs 3. Their current medications and supplements 4. The patient's functional ability including ADL's, fall risks, home safety risks and hearing or visual impairment. 5. Diet and physical activities 6. Evidence for depression or mood disorder  Subjective:   Review of Systems:   Constitutional: Denies fever, malaise, fatigue, headache or abrupt weight changes.  HEENT: Denies eye pain, eye redness, ear pain, ringing in the ears, wax buildup, runny nose, nasal congestion, bloody nose, or sore throat. Respiratory: Denies difficulty breathing, shortness of breath, cough or sputum production.   Cardiovascular: Denies chest pain, chest tightness, palpitations or swelling in the hands or feet.  Gastrointestinal: Denies abdominal pain, bloating, constipation, diarrhea or blood in the stool.  GU:  Denies urgency, frequency, pain with urination, burning sensation, blood in urine, odor or discharge. Musculoskeletal: Pt reports intermittent joint pain. Denies decrease in range of motion, difficulty with gait, muscle pain or joint swelling.  Skin: Denies redness, rashes, lesions or ulcercations.  Neurological: Denies dizziness, difficulty with memory, difficulty with speech or problems with balance and coordination.  Psych: Denies anxiety, depression, SI/HI.  No other specific complaints in a complete review of systems (except as listed in HPI above).  Objective:  PE:   BP 118/76   Pulse 79   Temp 98.4 F (36.9 C) (Oral)   Ht 6' 1.5" (1.867 m)   Wt 163 lb (73.9 kg)   SpO2 98%   BMI 21.21 kg/m   Wt Readings from Last 3 Encounters:  06/18/17 174 lb 12 oz (79.3 kg)  06/13/17 166 lb (75.3 kg)  04/17/17 170 lb 4 oz (77.2 kg)    General: Appears his stated age, well developed, well nourished in NAD. Skin: Warm, dry and intact. No ulcerations noted. HEENT: Head: normal shape and size; Eyes: sclera white, no icterus, conjunctiva pink, PERRLA and EOMs intact; Ears: Tm's gray and intact, normal light reflex; Throat/Mouth: Mucosa pink and moist, no exudate, lesions or ulcerations noted.  Neck: Neck supple, trachea midline. No masses, lumps or thyromegaly present.  Cardiovascular: Normal rate and rhythm. S1,S2 noted.  No murmur, rubs or gallops noted. No JVD or BLE edema. No carotid bruits noted. Pulmonary/Chest: Normal effort and positive vesicular breath sounds. No respiratory distress. No wheezes, rales or ronchi noted.  Abdomen: Soft and nontender. Normal bowel sounds. No distention or masses noted. Liver, spleen and kidneys non palpable. Musculoskeletal: Joint enlargement noted of hands, shoulders and knees. Strength 5/5 BUE/BLE. No  difficulty with gait. Neurological: Alert and oriented. Cranial nerves II-XII grossly intact. Coordination normal.  Psychiatric: Mood and affect normal.  Behavior is normal. Judgment and thought content normal.     BMET    Component Value Date/Time   NA 141 06/18/2017 1425   NA 141 04/20/2014 1019   K 4.4 06/18/2017 1425   K 3.7 04/20/2014 1019   CL 100 06/18/2017 1425   CL 106 04/20/2014 1019   CO2 25 06/18/2017 1425   CO2 30 04/20/2014 1019   GLUCOSE 157 (H) 06/18/2017 1425   GLUCOSE 103 (H) 11/11/2016 1202   GLUCOSE 102 (H) 04/20/2014 1019   BUN 16 06/18/2017 1425   BUN 14 04/20/2014 1019   CREATININE 0.99 06/18/2017 1425   CREATININE 1.18 04/20/2014 1019   CALCIUM 10.0 06/18/2017 1425   CALCIUM 9.3 04/20/2014 1019   GFRNONAA 82 06/18/2017 1425   GFRNONAA >60 04/20/2014 1019   GFRNONAA >60 11/19/2012 1107   GFRAA 95 06/18/2017 1425   GFRAA >60 04/20/2014 1019   GFRAA >60 11/19/2012 1107    Lipid Panel     Component Value Date/Time   CHOL 138 06/18/2017 1425   TRIG 101 06/18/2017 1425   HDL 67 06/18/2017 1425   CHOLHDL 2.1 06/18/2017 1425   CHOLHDL 3 08/21/2016 1301   VLDL 46.2 (H) 08/21/2016 1301   LDLCALC 51 06/18/2017 1425    CBC    Component Value Date/Time   WBC 3.6 (L) 11/11/2016 1202   RBC 3.83 (L) 11/11/2016 1202   HGB 11.9 (L) 11/11/2016 1202   HGB 13.5 04/20/2014 1019   HCT 35.9 (L) 11/11/2016 1202   HCT 41.8 04/20/2014 1019   PLT 230 11/11/2016 1202   PLT 348 04/20/2014 1019   MCV 93.7 11/11/2016 1202   MCV 90 04/20/2014 1019   MCH 31.1 11/11/2016 1202   MCHC 33.1 11/11/2016 1202   RDW 14.5 11/11/2016 1202   RDW 16.2 (H) 04/20/2014 1019   LYMPHSABS 1.9 09/06/2015 1632   LYMPHSABS 2.1 04/20/2014 1019   MONOABS 0.4 09/06/2015 1632   MONOABS 0.5 04/20/2014 1019   EOSABS 1.3 (H) 09/06/2015 1632   EOSABS 0.3 04/20/2014 1019   BASOSABS 0.0 09/06/2015 1632   BASOSABS 0.1 04/20/2014 1019   BASOSABS 0 06/13/2012 1515    Hgb A1C Lab Results  Component Value Date   HGBA1C 6.3 05/09/2016      Assessment and Plan:   Medicare Annual Wellness Visit:  Diet: He does eat meat. He consumes  fruits and veggies daily. He does eat some fried food. He drinks mostly water, juice or soda. Physical activity: None Depression/mood screen: Negative Hearing: Intact to whispered voice Visual acuity: Grossly normal ADLs: Capable Fall risk: None Home safety: Good Cognitive evaluation: Intact to orientation, naming, recall and repetition EOL planning: No adv directives, full code/ I agree  Preventative Medicine: Encouraged him to get a flu shot in the fall.  Tetanus up-to-date.  Pneumovax given today.  PSA will be done with labs today.  Colon screening up-to-date.  Encouraged him to consume a balanced diet and exercise regimen.  Advised him to see an eye doctor and dentist annually.  Will check CBC, Cmet, lipid, A1C, uric acid level today.   Next appointment: 1 year, Medicare Wellness Exam   [name], NP

## 2017-07-30 NOTE — Assessment & Plan Note (Signed)
Encouraged him to stay active Continue Tylenol as needed

## 2017-07-30 NOTE — Assessment & Plan Note (Signed)
Continue Albuterol use as needed

## 2017-07-30 NOTE — Assessment & Plan Note (Addendum)
Controlled with Entresto, Carvedilol, Aspirin and Spironolactone He will continue to follow with Dr. Fletcher Anon

## 2017-07-30 NOTE — Assessment & Plan Note (Signed)
A1c today No microalbumin due to ACEI therapy Encouraged him to consume a low carb diet Foot exam today Encouraged him to see an eye doctor yearly Pneumovax today

## 2017-07-31 NOTE — Addendum Note (Signed)
Addended by: Lurlean Nanny on: 07/31/2017 09:37 AM   Modules accepted: Orders

## 2017-08-07 NOTE — Addendum Note (Signed)
Addended by: Lurlean Nanny on: 08/07/2017 05:20 PM   Modules accepted: Orders

## 2017-08-13 ENCOUNTER — Telehealth: Payer: Self-pay | Admitting: Internal Medicine

## 2017-08-13 NOTE — Telephone Encounter (Signed)
Copied from [place] [id number]. Topic: Quick Communication - Rx Refill/Question >> Aug 13, 2017  3:15 PM Aurelio Brash B wrote: Medication: carvedilol (COREG) 12.5 MG tablet  Has the patient contacted their pharmacy? yes  (Agent: If no, request that the patient contact the pharmacy for the refill.) (Agent: If yes, when and what did the pharmacy advise?)  Preferred Pharmacy (with phone number or street name):[facility] [place], [place] - [place] [phone] (Phone) [phone] (Fax)      Agent: Please be advised that RX refills may take up to 3 business days. We ask that you follow-up with your pharmacy.

## 2017-08-13 NOTE — Telephone Encounter (Signed)
Copied from [place] [id number]. Topic: Quick Communication - Rx Refill/Question >> Aug 13, 2017  3:15 PM Aurelio Brash B wrote: Medication: carvedilol (COREG) 12.5 MG tablet  Has the patient contacted their pharmacy? yes  (Agent: If no, request that the patient contact the pharmacy for the refill.) (Agent: If yes, when and what did the pharmacy advise?)  Preferred Pharmacy (with phone number or street name):[facility] [place], [place] - [place] [phone] (Phone) [phone] (Fax)      Agent: Please be advised that RX refills may take up to 3 business days. We ask that you follow-up with your pharmacy. >> Aug 13, 2017  5:07 PM Cleaster Corin, NT wrote: Pt. Calling back to check on refill on med. Did state that it can take up to 3 days for refill

## 2017-08-14 MED ORDER — CARVEDILOL 12.5 MG PO TABS: 12.5000 mg | ORAL_TABLET | Freq: Two times a day (BID) | ORAL | 5 refills | Status: DC

## 2017-08-16 MED ORDER — FENOFIBRATE 54 MG PO TABS: 54.0000 mg | ORAL_TABLET | Freq: Every day | ORAL | 2 refills | Status: DC

## 2017-08-16 MED ORDER — ALLOPURINOL 100 MG PO TABS: 200.0000 mg | ORAL_TABLET | Freq: Every day | ORAL | 0 refills | Status: DC

## 2017-08-16 NOTE — Addendum Note (Signed)
Addended by: Lurlean Nanny on: 08/16/2017 05:40 PM   Modules accepted: Orders

## 2017-08-20 ENCOUNTER — Other Ambulatory Visit (INDEPENDENT_AMBULATORY_CARE_PROVIDER_SITE_OTHER): Payer: Medicare Other

## 2017-08-20 DIAGNOSIS — M1A00X0 Idiopathic chronic gout, unspecified site, without tophus (tophi): Secondary | ICD-10-CM

## 2017-08-20 DIAGNOSIS — I10 Essential (primary) hypertension: Secondary | ICD-10-CM

## 2017-08-20 LAB — URIC ACID: Uric Acid, Serum: 5.3 mg/dL (ref 4.0–7.8)

## 2017-08-20 LAB — POTASSIUM: POTASSIUM: 4.8 meq/L (ref 3.5–5.1)

## 2017-08-20 NOTE — Progress Notes (Signed)
lab

## 2017-09-19 DIAGNOSIS — L209 Atopic dermatitis, unspecified: Secondary | ICD-10-CM | POA: Diagnosis not present

## 2017-09-19 DIAGNOSIS — L819 Disorder of pigmentation, unspecified: Secondary | ICD-10-CM | POA: Diagnosis not present

## 2017-10-15 ENCOUNTER — Encounter: Payer: Self-pay | Admitting: Family

## 2017-10-15 ENCOUNTER — Ambulatory Visit: Payer: Medicare Other | Attending: Family | Admitting: Family

## 2017-10-15 VITALS — BP 124/86 | HR 85 | Resp 18 | Ht 76.0 in | Wt 173.2 lb

## 2017-10-15 DIAGNOSIS — Z888 Allergy status to other drugs, medicaments and biological substances status: Secondary | ICD-10-CM | POA: Diagnosis not present

## 2017-10-15 DIAGNOSIS — E119 Type 2 diabetes mellitus without complications: Secondary | ICD-10-CM | POA: Insufficient documentation

## 2017-10-15 DIAGNOSIS — I428 Other cardiomyopathies: Secondary | ICD-10-CM | POA: Insufficient documentation

## 2017-10-15 DIAGNOSIS — I10 Essential (primary) hypertension: Secondary | ICD-10-CM

## 2017-10-15 DIAGNOSIS — M109 Gout, unspecified: Secondary | ICD-10-CM | POA: Insufficient documentation

## 2017-10-15 DIAGNOSIS — Z7982 Long term (current) use of aspirin: Secondary | ICD-10-CM | POA: Diagnosis not present

## 2017-10-15 DIAGNOSIS — Z96611 Presence of right artificial shoulder joint: Secondary | ICD-10-CM | POA: Diagnosis not present

## 2017-10-15 DIAGNOSIS — M1990 Unspecified osteoarthritis, unspecified site: Secondary | ICD-10-CM | POA: Diagnosis not present

## 2017-10-15 DIAGNOSIS — Z96641 Presence of right artificial hip joint: Secondary | ICD-10-CM | POA: Insufficient documentation

## 2017-10-15 DIAGNOSIS — E785 Hyperlipidemia, unspecified: Secondary | ICD-10-CM | POA: Insufficient documentation

## 2017-10-15 DIAGNOSIS — Z833 Family history of diabetes mellitus: Secondary | ICD-10-CM | POA: Insufficient documentation

## 2017-10-15 DIAGNOSIS — Z8249 Family history of ischemic heart disease and other diseases of the circulatory system: Secondary | ICD-10-CM | POA: Diagnosis not present

## 2017-10-15 DIAGNOSIS — R5383 Other fatigue: Secondary | ICD-10-CM | POA: Diagnosis present

## 2017-10-15 DIAGNOSIS — I110 Hypertensive heart disease with heart failure: Secondary | ICD-10-CM | POA: Insufficient documentation

## 2017-10-15 DIAGNOSIS — I5022 Chronic systolic (congestive) heart failure: Secondary | ICD-10-CM | POA: Insufficient documentation

## 2017-10-15 DIAGNOSIS — Z720 Tobacco use: Secondary | ICD-10-CM

## 2017-10-15 DIAGNOSIS — Z79899 Other long term (current) drug therapy: Secondary | ICD-10-CM | POA: Diagnosis not present

## 2017-10-15 DIAGNOSIS — I2510 Atherosclerotic heart disease of native coronary artery without angina pectoris: Secondary | ICD-10-CM | POA: Insufficient documentation

## 2017-10-15 DIAGNOSIS — F17210 Nicotine dependence, cigarettes, uncomplicated: Secondary | ICD-10-CM | POA: Diagnosis not present

## 2017-10-15 DIAGNOSIS — J45909 Unspecified asthma, uncomplicated: Secondary | ICD-10-CM | POA: Insufficient documentation

## 2017-10-15 NOTE — Progress Notes (Signed)
Patient ID: Travis [name], male    DOB: [birth year], 62 y.o.   MRN: [id number]  HPI  Travis [name] is a 62 y/o male with a history of osteoarthritis, hyperlipidemia, gout, HTN, GI bleed, DM, CAD, anemia, asthma, previous alcohol use, current tobacco use and chronic heart failure.  Reviewed last echo done 08/08/16 which showed an EF of 35-40%. This is an improvement from his last echo which was done 01/26/15 and showed an EF of 25-30% along with trivial Travis/ TR. EF has dropped from 2015 when it was 45-50%. Had a cardiac catheterization done 01/28/15 which showed 10% stenosis in Prox RCA to Mid RCA and 30% stenosis in Prox LAD to Mid LAD which is not much different from previous angiography. Cardiomyopathy felt to be due to previous alcohol use.   Has not been admitted or been in the ED in the last 6 months.  He presents today for a follow-up visit with a chief complaint of minimal fatigue upon moderate exertion. He describes this as chronic in nature having been present for several years. He has associated light-headedness and slight weight gain along with this. He denies any shortness of breath, cough, chest pain, pedal edema, palpitations or difficulty sleeping.   Past Medical History:  Diagnosis Date  . Alcohol abuse   . 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 Allergy   . Asthma   . 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 Chronic anemia   . Chronic systolic CHF (congestive heart failure) ([place])    a. EF 20-25% by echo 12/28/10; b improved to 45-50% on echo in June 2015  . 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 Clotting disorder ([place])   . Coronary artery disease    Cardiac catheterization in November 2016 showed mild 30% proximal LAD stenosis with no evidence of obstructive disease.  . Diabetes mellitus, type 2 ([place])    pt reports his DM is gone  . GI bleed    15 years ago  . Gout   . Hemoptysis    secondary to pulmonary edema   . Hyperlipidemia   . Hypertension   . Nonischemic cardiomyopathy ([place])    minimal coronary disease by cath 12/29/10  . Osteoarthritis   . Pneumonia   . Tobacco abuse   . Vision loss    peripherial vision only left eye.[facility]  artery occusion    Past Surgical History:  Procedure Laterality Date  . CARDIAC CATHETERIZATION    . CARDIAC CATHETERIZATION N/A 01/28/2015   Procedure: Left Heart Cath and Coronary Angiography;  Surgeon: Wellington Hampshire, MD;  Location: [facility];  Service: Cardiovascular;  Laterality: N/A;  . COLONOSCOPY    . HIP ARTHROPLASTY Right 03/15/2013   Procedure: ARTHROPLASTY BIPOLAR HIP;  Surgeon: [name], MD;  Location: [facility];  Service: Orthopedics;  Laterality: Right;  . TOTAL SHOULDER ARTHROPLASTY Right 09/01/2015   Procedure: RIGHT TOTAL SHOULDER ARTHROPLASTY;  Surgeon: [name], MD;  Location: [facility];  Service: Orthopedics;  Laterality: Right;   Family History  Problem Relation Age of Onset  . Diabetes Mother   . Hypertension Mother   . Diabetes Father   . Hypertension Father   . Diabetes Sister   . Dementia Brother   . Cancer Neg Hx   . Heart disease Neg Hx   . Stroke Neg Hx   . Colon cancer Neg Hx   . Esophageal cancer Neg Hx   .  Rectal cancer Neg Hx   . Stomach cancer Neg Hx    Social History   Tobacco Use  . Smoking status: Current Every Day Smoker    Packs/day: 0.50    Years: 30.00    Pack years: 15.00    Types: Cigarettes  . Smokeless tobacco: Never Used  Substance Use Topics  . Alcohol use: Yes    Alcohol/week: 0.6 oz    Types: 1 Glasses of wine per week    Comment: occasionally   Allergies  Allergen Reactions  . Tramadol Itching and Rash   Past Medical History:  Diagnosis Date  . Alcohol abuse   . 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 Allergy   . Asthma   . 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 Chronic anemia   . Chronic systolic CHF (congestive heart failure)  ([place])    a. EF 20-25% by echo 12/28/10; b improved to 45-50% on echo in June 2015  . 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 Clotting disorder ([place])   . Coronary artery disease    Cardiac catheterization in November 2016 showed mild 30% proximal LAD stenosis with no evidence of obstructive disease.  . Diabetes mellitus, type 2 ([place])    pt reports his DM is gone  . GI bleed    15 years ago  . Gout   . Hemoptysis    secondary to pulmonary edema  . Hyperlipidemia   . Hypertension   . Nonischemic cardiomyopathy ([place])    minimal coronary disease by cath 12/29/10  . Osteoarthritis   . Pneumonia   . Tobacco abuse   . Vision loss    peripherial vision only left eye.[facility] Retinla  artery occusion    Past Surgical History:  Procedure Laterality Date  . CARDIAC CATHETERIZATION    . CARDIAC CATHETERIZATION N/A 01/28/2015   Procedure: Left Heart Cath and Coronary Angiography;  Surgeon: Wellington Hampshire, MD;  Location: [facility];  Service: Cardiovascular;  Laterality: N/A;  . COLONOSCOPY    . HIP ARTHROPLASTY Right 03/15/2013   Procedure: ARTHROPLASTY BIPOLAR HIP;  Surgeon: [name], MD;  Location: [facility];  Service: Orthopedics;  Laterality: Right;  . TOTAL SHOULDER ARTHROPLASTY Right 09/01/2015   Procedure: RIGHT TOTAL SHOULDER ARTHROPLASTY;  Surgeon: [name], MD;  Location: [facility];  Service: Orthopedics;  Laterality: Right;   Family History  Problem Relation Age of Onset  . Diabetes Mother   . Hypertension Mother   . Diabetes Father   . Hypertension Father   . Diabetes Sister   . Dementia Brother   . Cancer Neg Hx   . Heart disease Neg Hx   . Stroke Neg Hx   . Colon cancer Neg Hx   . Esophageal cancer Neg Hx   . Rectal cancer Neg Hx   . Stomach cancer Neg Hx    Social History   Tobacco Use  . Smoking status: Current Every Day Smoker    Packs/day: 0.50    Years: 30.00    Pack years: 15.00    Types:  Cigarettes  . Smokeless tobacco: Never Used  Substance Use Topics  . Alcohol use: Yes    Alcohol/week: 0.6 oz    Types: 1 Glasses of wine per week    Comment: occasionally   Allergies  Allergen Reactions  . Tramadol Itching and Rash   Prior to Admission medications   Medication Sig Start Date End Date  Taking? Authorizing Provider  albuterol (PROVENTIL HFA;VENTOLIN HFA) 108 (90 BASE) MCG/ACT inhaler Inhale 2 puffs into the lungs every 6 (six) hours as needed for wheezing or shortness of breath. Take 2 puffs every 5-10 minutes up to 6 puffs total over 15 minutes when needed.  Use with a spacer. Patient taking differently: Inhale 2 puffs into the lungs every 6 (six) hours as needed for wheezing or shortness of breath. Take 2 puffs up to 6 puffs total over 15 minutes when needed.  Use with a spacer. 01/24/15  Yes Ahmed Prima, MD  allopurinol (ZYLOPRIM) 100 MG tablet Take 2 tablets (200 mg total) by mouth daily. 08/16/17  Yes [name], NP  aspirin EC 325 MG tablet Take 162.5 mg by mouth daily.   Yes [provider]  atorvastatin (LIPITOR) 20 MG tablet Take 1 tablet (20 mg total) by mouth daily. 06/19/17 10/16/18 Yes Wellington Hampshire, MD  carvedilol (COREG) 12.5 MG tablet Take 1 tablet (12.5 mg total) by mouth 2 (two) times daily with a meal. 08/14/17  Yes Baity, Coralie Keens, NP  cetirizine (ZYRTEC) 10 MG tablet Take 1 tablet (10 mg total) by mouth daily. 06/07/16  Yes Darylene Price A, FNP  fenofibrate 54 MG tablet Take 1 tablet (54 mg total) by mouth daily. 08/16/17  Yes Baity, Coralie Keens, NP  fluticasone (FLONASE) 50 MCG/ACT nasal spray Place 1 spray into both nostrils 2 (two) times daily. Patient taking differently: Place 1 spray into both nostrils 2 (two) times daily as needed for allergies.  03/29/16  Yes Cuthriell, Charline Bills, PA-C  folic acid (FOLVITE) 1 MG tablet Take 1 tablet (1 mg total) by mouth daily. 09/20/16  Yes Alisa Graff, FNP  Multiple Vitamin (MULTIVITAMIN WITH  MINERALS) TABS tablet Take 1 tablet by mouth daily. Men's One a Day   Yes [provider]  naproxen (NAPROSYN) 500 MG tablet Take 500 mg by mouth 2 (two) times daily as needed for moderate pain (pain).    Yes [provider]  Omega-3 Fatty Acids (FISH OIL PO) Take 1 capsule by mouth daily.   Yes [provider]  sacubitril-valsartan (ENTRESTO) 49-51 MG Take 1 tablet by mouth 2 (two) times daily. 01/15/17  Yes Darylene Price A, FNP  spironolactone (ALDACTONE) 25 MG tablet Take 0.5 tablets (12.5 mg total) by mouth daily. 04/17/17  Yes Alisa Graff, FNP    Review of Systems  Constitutional: Positive for fatigue. Negative for appetite change.  HENT: Negative for congestion, postnasal drip and sore throat.   Eyes: Negative.   Respiratory: Negative for cough, chest tightness and shortness of breath.   Cardiovascular: Negative for chest pain, palpitations and leg swelling.  Gastrointestinal: Negative for abdominal distention and abdominal pain.  Endocrine: Negative.   Genitourinary: Negative.   Musculoskeletal: Negative for back pain and neck pain.  Skin: Negative.   Allergic/Immunologic: Negative.   Neurological: Positive for light-headedness (when getting up too quickly). Negative for dizziness and headaches.  Hematological: Negative for adenopathy. Does not bruise/bleed easily.  Psychiatric/Behavioral: Negative for dysphoric mood, sleep disturbance (sleeping on 1 pillow) and suicidal ideas. The patient is not nervous/anxious.    Vitals:   10/15/17 1002  BP: 124/86  Pulse: 85  Resp: 18  SpO2: 100%  Weight: 173 lb 4 oz (78.6 kg)  Height: 6\' 4"  (1.93 m)   Wt Readings from Last 3 Encounters:  10/15/17 173 lb 4 oz (78.6 kg)  07/30/17 163 lb (73.9 kg)  06/18/17 174  lb 12 oz (79.3 kg)   Lab Results  Component Value Date   CREATININE 1.39 07/30/2017   CREATININE 0.99 06/18/2017   CREATININE 1.47 (H) 11/11/2016    Physical Exam  Constitutional: He is  oriented to person, place, and time. He appears well-developed and well-nourished.  HENT:  Head: Normocephalic and atraumatic.  Neck: Normal range of motion. Neck supple. No JVD present.  Cardiovascular: Normal rate and regular rhythm.  Pulmonary/Chest: Effort normal. He has no wheezes. He has no rales.  Abdominal: Soft. He exhibits no distension. There is no tenderness.  Musculoskeletal: He exhibits no edema or tenderness.  Neurological: He is alert and oriented to person, place, and time.  Skin: Skin is warm and dry.  Psychiatric: He has a normal mood and affect. His behavior is normal. Thought content normal.  Nursing note and vitals reviewed.    Assessment & Plan:  1: Chronic heart failure with reduced ejection fraction- - NYHA class II - euvolemic today - continue weighing daily and call for an overnight weight gain of >2 pounds/weekly weight gain of >5 pounds.  - weight up 3 pounds since he was last here 6 months ago - not adding any salt to his food - saw cardiologist Fletcher Anon) 06/18/17 - consider titrating up entresto if the future if BP allows; BP has been low in the past - consider titrating up carvedilol if he remains tachycardic - Pharm D reconciled medications with the patient  2: HTN- - BP looks good today; has been on the low side in the past - BMP from 07/30/17 reviewed and shows sodium 137, potassium 5.5 and GFR 66.69; repeat potassium level on 08/20/17 was 4.8 - saw his PCP Garnette Gunner) 02/13/17  3: Tobacco use- - continues to smoke about 1 ppd of cigarettes & says that he smoked about 20 minutes prior to coming to the clinic - reports never quitting before and doesn't desire to quit at this time - complete cessation discussed for 3 minutes with him   Continue weighing daily and call for an overnight weight gain of > 2 pounds or a weekly weight gain of >5 pounds.  Return in 6 months or sooner for any questions/problems before then.

## 2017-10-15 NOTE — Patient Instructions (Signed)
Continue weighing daily and call for an overnight weight gain of > 2 pounds or a weekly weight gain of >5 pounds. 

## 2017-10-31 DIAGNOSIS — L2089 Other atopic dermatitis: Secondary | ICD-10-CM | POA: Diagnosis not present

## 2017-10-31 DIAGNOSIS — L308 Other specified dermatitis: Secondary | ICD-10-CM | POA: Diagnosis not present

## 2017-10-31 DIAGNOSIS — R234 Changes in skin texture: Secondary | ICD-10-CM | POA: Diagnosis not present

## 2017-10-31 DIAGNOSIS — L819 Disorder of pigmentation, unspecified: Secondary | ICD-10-CM | POA: Diagnosis not present

## 2017-11-22 ENCOUNTER — Other Ambulatory Visit: Payer: Self-pay | Admitting: Internal Medicine

## 2017-11-22 DIAGNOSIS — I5022 Chronic systolic (congestive) heart failure: Secondary | ICD-10-CM

## 2017-11-22 NOTE — Telephone Encounter (Signed)
Previously filled by [facility] CHF clinic. Darylene Price NP.

## 2017-11-22 NOTE — Telephone Encounter (Signed)
Entresto 49-51 mg refill Last Refill:01/15/17 # 60 Last OV: 06/13/17 PCP: [name] Pharmacy:[facility]

## 2017-11-22 NOTE — Telephone Encounter (Signed)
Copied from [place] [id number]. Topic: Quick Communication - Rx Refill/Question >> Nov 22, 2017  1:58 PM Wynetta Emery, [place] C wrote: Medication: sacubitril-valsartan (ENTRESTO) 49-51 MG   Has the patient contacted their pharmacy? No  Preferred Pharmacy (with phone number or street name): [facility] - [place], [place]: Please be advised that RX refills may take up to 3 business days. We ask that you follow-up with your pharmacy.

## 2017-11-22 NOTE — Telephone Encounter (Signed)
Should continued to be filled by cardiology

## 2017-11-26 MED ORDER — SACUBITRIL-VALSARTAN 49-51 MG PO TABS: 1.0000 | ORAL_TABLET | Freq: Two times a day (BID) | ORAL | 5 refills | Status: DC

## 2017-12-04 ENCOUNTER — Other Ambulatory Visit: Payer: Self-pay | Admitting: Internal Medicine

## 2018-01-08 ENCOUNTER — Other Ambulatory Visit: Payer: Self-pay

## 2018-01-08 ENCOUNTER — Telehealth: Payer: Self-pay | Admitting: Internal Medicine

## 2018-01-08 DIAGNOSIS — E782 Mixed hyperlipidemia: Secondary | ICD-10-CM

## 2018-01-08 MED ORDER — SPIRONOLACTONE 25 MG PO TABS: 12.5000 mg | ORAL_TABLET | Freq: Every day | ORAL | 6 refills | Status: DC

## 2018-01-08 NOTE — Telephone Encounter (Signed)
He needs lipid rechecked due to elevated triglycerides.

## 2018-01-08 NOTE — Telephone Encounter (Signed)
I do not see what needs to rechecked as his last uric acid and potassium was normal. Is there labs you want to recheck?

## 2018-01-08 NOTE — Telephone Encounter (Signed)
Is pt due for any labs at this time?  Copied from [place] [phone]. Topic: General - Other >> Jan 08, 2018 12:06 PM Sheran Luz wrote: Reason for CRM: Pt called requesting blood sugar test per emmi call. Pt thinks that he also needs a "kidney test" but could not remember exactly which test. Please advise.

## 2018-01-15 NOTE — Addendum Note (Signed)
Addended by: Lurlean Nanny on: 01/15/2018 01:08 PM   Modules accepted: Orders

## 2018-01-17 ENCOUNTER — Other Ambulatory Visit (INDEPENDENT_AMBULATORY_CARE_PROVIDER_SITE_OTHER): Payer: Medicare Other

## 2018-01-17 DIAGNOSIS — E782 Mixed hyperlipidemia: Secondary | ICD-10-CM | POA: Diagnosis not present

## 2018-01-17 LAB — LIPID PANEL
CHOLESTEROL: 201 mg/dL — AB (ref 0–200)
HDL: 46.6 mg/dL (ref >39.00)
Total CHOL/HDL Ratio: 4
Triglycerides: 950 mg/dL — ABNORMAL HIGH (ref 0.0–149.0)

## 2018-01-17 LAB — LDL CHOLESTEROL, DIRECT: Direct LDL: 38 mg/dL

## 2018-01-22 ENCOUNTER — Other Ambulatory Visit: Payer: Self-pay | Admitting: Internal Medicine

## 2018-01-22 MED ORDER — FENOFIBRATE 145 MG PO TABS: 145.0000 mg | ORAL_TABLET | Freq: Every day | ORAL | 2 refills | Status: DC

## 2018-01-27 ENCOUNTER — Other Ambulatory Visit: Payer: Self-pay | Admitting: Internal Medicine

## 2018-02-10 DIAGNOSIS — B351 Tinea unguium: Secondary | ICD-10-CM | POA: Diagnosis not present

## 2018-02-10 DIAGNOSIS — B359 Dermatophytosis, unspecified: Secondary | ICD-10-CM | POA: Diagnosis not present

## 2018-02-10 DIAGNOSIS — L2089 Other atopic dermatitis: Secondary | ICD-10-CM | POA: Diagnosis not present

## 2018-02-11 ENCOUNTER — Other Ambulatory Visit: Payer: Self-pay

## 2018-02-11 NOTE — Telephone Encounter (Signed)
Pt says [facility] was supposed to send a request for spironolactone. I did not see it as a refill request. After further review the rx was sent last month. I advised him to contact the pharmacy.

## 2018-02-27 ENCOUNTER — Ambulatory Visit (INDEPENDENT_AMBULATORY_CARE_PROVIDER_SITE_OTHER): Payer: Medicare Other

## 2018-02-27 DIAGNOSIS — Z23 Encounter for immunization: Secondary | ICD-10-CM

## 2018-03-03 ENCOUNTER — Telehealth: Payer: Self-pay | Admitting: Internal Medicine

## 2018-03-03 DIAGNOSIS — Z79899 Other long term (current) drug therapy: Secondary | ICD-10-CM

## 2018-03-03 DIAGNOSIS — R748 Abnormal levels of other serum enzymes: Secondary | ICD-10-CM

## 2018-03-03 DIAGNOSIS — F1010 Alcohol abuse, uncomplicated: Secondary | ICD-10-CM

## 2018-03-03 DIAGNOSIS — I10 Essential (primary) hypertension: Secondary | ICD-10-CM

## 2018-03-03 NOTE — Telephone Encounter (Signed)
Needs repeat  CMET, acute hep panel and right upper quadrant ultrasound.

## 2018-03-03 NOTE — Telephone Encounter (Signed)
Spoke to [facility] requesting they refax PPW to [phone]

## 2018-03-03 NOTE — Telephone Encounter (Signed)
[facility] called to see if the fax they sent was received. Fax was regarding information on medication they are wanting to prescribe. [facility] # [phone], ext. [place]

## 2018-03-05 ENCOUNTER — Other Ambulatory Visit (INDEPENDENT_AMBULATORY_CARE_PROVIDER_SITE_OTHER): Payer: Medicare Other

## 2018-03-05 DIAGNOSIS — R748 Abnormal levels of other serum enzymes: Secondary | ICD-10-CM | POA: Diagnosis not present

## 2018-03-05 LAB — COMPREHENSIVE METABOLIC PANEL
ALT: 14 U/L (ref 0–53)
AST: 22 U/L (ref 0–37)
Albumin: 4.4 g/dL (ref 3.5–5.2)
Alkaline Phosphatase: 43 U/L (ref 39–117)
BUN: 20 mg/dL (ref 6–23)
CO2: 32 mEq/L (ref 19–32)
Calcium: 10.3 mg/dL (ref 8.4–10.5)
Chloride: 102 mEq/L (ref 96–112)
Creatinine, Ser: 1.24 mg/dL (ref 0.40–1.50)
GFR: 75.93 mL/min (ref >60.00)
Glucose, Bld: 111 mg/dL — ABNORMAL HIGH (ref 70–99)
POTASSIUM: 4.4 meq/L (ref 3.5–5.1)
Sodium: 139 mEq/L (ref 135–145)
Total Bilirubin: 0.4 mg/dL (ref 0.2–1.2)
Total Protein: 7.3 g/dL (ref 6.0–8.3)

## 2018-03-05 NOTE — Addendum Note (Signed)
Addended by: Lurlean Nanny on: 03/05/2018 08:52 AM   Modules accepted: Orders

## 2018-03-05 NOTE — Telephone Encounter (Signed)
Ultrasound ordered, will follow up him and dermatology once labs and ultrasound results are back.

## 2018-03-05 NOTE — Addendum Note (Signed)
Addended by: Lurlean Nanny on: 03/05/2018 09:28 AM   Modules accepted: Orders

## 2018-03-05 NOTE — Addendum Note (Signed)
Addended by: [name] on: 03/05/2018 12:13 PM   Modules accepted: Orders

## 2018-03-05 NOTE — Telephone Encounter (Signed)
Pt has lab appt scheduled for today and is okay with getting [place]

## 2018-03-07 NOTE — Telephone Encounter (Signed)
Pt called back and said he could not remember the name of the medication that Rollene Fare told him to stop to be able to take the medication the dermatologist was going to prescribe. Call pt at [phone]

## 2018-03-07 NOTE — Telephone Encounter (Signed)
Travis [name] spoke to [place] with [facility] and they are aware of the normal liver enzyme results.

## 2018-03-10 NOTE — Telephone Encounter (Signed)
Pt is aware per [facility] that he should

## 2018-03-21 DIAGNOSIS — H538 Other visual disturbances: Secondary | ICD-10-CM | POA: Diagnosis not present

## 2018-03-21 DIAGNOSIS — H53419 Scotoma involving central area, unspecified eye: Secondary | ICD-10-CM | POA: Diagnosis not present

## 2018-03-21 DIAGNOSIS — H3410 Central retinal artery occlusion, unspecified eye: Secondary | ICD-10-CM | POA: Diagnosis not present

## 2018-04-03 ENCOUNTER — Other Ambulatory Visit: Payer: Self-pay | Admitting: Internal Medicine

## 2018-04-08 ENCOUNTER — Ambulatory Visit: Payer: Medicare Other | Admitting: Internal Medicine

## 2018-04-11 ENCOUNTER — Other Ambulatory Visit: Payer: Self-pay | Admitting: Internal Medicine

## 2018-04-14 DIAGNOSIS — B351 Tinea unguium: Secondary | ICD-10-CM | POA: Diagnosis not present

## 2018-04-14 DIAGNOSIS — B359 Dermatophytosis, unspecified: Secondary | ICD-10-CM | POA: Diagnosis not present

## 2018-04-14 DIAGNOSIS — L309 Dermatitis, unspecified: Secondary | ICD-10-CM | POA: Diagnosis not present

## 2018-04-16 ENCOUNTER — Encounter: Payer: Self-pay | Admitting: Family

## 2018-04-16 ENCOUNTER — Ambulatory Visit: Payer: Medicare Other | Attending: Family | Admitting: Family

## 2018-04-16 VITALS — BP 147/97 | HR 80 | Resp 18 | Ht 76.0 in | Wt 189.4 lb

## 2018-04-16 DIAGNOSIS — M109 Gout, unspecified: Secondary | ICD-10-CM | POA: Diagnosis not present

## 2018-04-16 DIAGNOSIS — J45909 Unspecified asthma, uncomplicated: Secondary | ICD-10-CM | POA: Insufficient documentation

## 2018-04-16 DIAGNOSIS — I10 Essential (primary) hypertension: Secondary | ICD-10-CM

## 2018-04-16 DIAGNOSIS — Z833 Family history of diabetes mellitus: Secondary | ICD-10-CM | POA: Insufficient documentation

## 2018-04-16 DIAGNOSIS — Z79899 Other long term (current) drug therapy: Secondary | ICD-10-CM | POA: Insufficient documentation

## 2018-04-16 DIAGNOSIS — Z96611 Presence of right artificial shoulder joint: Secondary | ICD-10-CM | POA: Diagnosis not present

## 2018-04-16 DIAGNOSIS — E785 Hyperlipidemia, unspecified: Secondary | ICD-10-CM | POA: Insufficient documentation

## 2018-04-16 DIAGNOSIS — M1990 Unspecified osteoarthritis, unspecified site: Secondary | ICD-10-CM | POA: Diagnosis not present

## 2018-04-16 DIAGNOSIS — I2510 Atherosclerotic heart disease of native coronary artery without angina pectoris: Secondary | ICD-10-CM | POA: Insufficient documentation

## 2018-04-16 DIAGNOSIS — Z8249 Family history of ischemic heart disease and other diseases of the circulatory system: Secondary | ICD-10-CM | POA: Diagnosis not present

## 2018-04-16 DIAGNOSIS — E119 Type 2 diabetes mellitus without complications: Secondary | ICD-10-CM | POA: Diagnosis not present

## 2018-04-16 DIAGNOSIS — I5022 Chronic systolic (congestive) heart failure: Secondary | ICD-10-CM | POA: Diagnosis not present

## 2018-04-16 DIAGNOSIS — D649 Anemia, unspecified: Secondary | ICD-10-CM | POA: Diagnosis not present

## 2018-04-16 DIAGNOSIS — I110 Hypertensive heart disease with heart failure: Secondary | ICD-10-CM | POA: Insufficient documentation

## 2018-04-16 DIAGNOSIS — F17210 Nicotine dependence, cigarettes, uncomplicated: Secondary | ICD-10-CM | POA: Insufficient documentation

## 2018-04-16 DIAGNOSIS — Z7982 Long term (current) use of aspirin: Secondary | ICD-10-CM | POA: Diagnosis not present

## 2018-04-16 DIAGNOSIS — I429 Cardiomyopathy, unspecified: Secondary | ICD-10-CM | POA: Diagnosis not present

## 2018-04-16 DIAGNOSIS — Z720 Tobacco use: Secondary | ICD-10-CM

## 2018-04-16 DIAGNOSIS — Z888 Allergy status to other drugs, medicaments and biological substances status: Secondary | ICD-10-CM | POA: Diagnosis not present

## 2018-04-16 MED ORDER — SACUBITRIL-VALSARTAN 97-103 MG PO TABS: 1.0000 | ORAL_TABLET | Freq: Two times a day (BID) | ORAL | 3 refills | Status: DC

## 2018-04-16 NOTE — Progress Notes (Signed)
Patient ID: Travis [name], male    DOB: May 01, 1955, 63 y.o.   MRN: [id number]  HPI  Travis [name] is a 63 y/o male with a history of osteoarthritis, hyperlipidemia, gout, HTN, GI bleed, DM, CAD, anemia, asthma, previous alcohol use, current tobacco use and chronic heart failure.  Reviewed last echo done 08/08/16 which showed an EF of 35-40%. This is an improvement from his last echo which was done 01/26/15 and showed an EF of 25-30% along with trivial Travis/ TR. EF has dropped from 2015 when it was 45-50%.   Had a cardiac catheterization done 01/28/15 which showed 10% stenosis in Prox RCA to Mid RCA and 30% stenosis in Prox LAD to Mid LAD which is not much different from previous angiography. Cardiomyopathy felt to be due to previous alcohol use.   Has not been admitted or been in the ED in the last 6 months.  He presents today for a follow-up visit with a chief complaint of minimal fatigue upon moderate exertion. He describes this as chronic in nature having been present for several years. He has associated light-headedness and weight gain along with this. He denies any difficulty sleeping, abdominal distention, palpitations, pedal edema, chest pain, shortness of breath or cough.   Past Medical History:  Diagnosis Date  . Alcohol abuse   . 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 Allergy   . Asthma   . 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 Chronic anemia   . Chronic systolic CHF (congestive heart failure) ([place])    a. EF 20-25% by echo 12/28/10; b improved to 45-50% on echo in June 2015  . 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 Clotting disorder ([place])   . Coronary artery disease    Cardiac catheterization in November 2016 showed mild 30% proximal LAD stenosis with no evidence of obstructive disease.  . Diabetes mellitus, type 2 ([place])    pt reports his DM is gone  . GI bleed    15 years ago  . Gout   . Hemoptysis    secondary  to pulmonary edema  . Hyperlipidemia   . Hypertension   . Nonischemic cardiomyopathy ([place])    minimal coronary disease by cath 12/29/10  . Osteoarthritis   . Pneumonia   . Tobacco abuse   . Vision loss    peripherial vision only left eye.[facility]  artery occusion    Past Surgical History:  Procedure Laterality Date  . CARDIAC CATHETERIZATION    . CARDIAC CATHETERIZATION N/A 01/28/2015   Procedure: Left Heart Cath and Coronary Angiography;  Surgeon: Wellington Hampshire, MD;  Location: [facility];  Service: Cardiovascular;  Laterality: N/A;  . COLONOSCOPY    . HIP ARTHROPLASTY Right 03/15/2013   Procedure: ARTHROPLASTY BIPOLAR HIP;  Surgeon: [name], MD;  Location: [facility];  Service: Orthopedics;  Laterality: Right;  . TOTAL SHOULDER ARTHROPLASTY Right 09/01/2015   Procedure: RIGHT TOTAL SHOULDER ARTHROPLASTY;  Surgeon: [name], MD;  Location: [facility];  Service: Orthopedics;  Laterality: Right;   Family History  Problem Relation Age of Onset  . Diabetes Mother   . Hypertension Mother   . Diabetes Father   . Hypertension Father   . Diabetes Sister   . Dementia Brother   . Cancer Neg Hx   . Heart disease Neg Hx   . Stroke Neg Hx   . Colon cancer Neg Hx   . Esophageal cancer  Neg Hx   . Rectal cancer Neg Hx   . Stomach cancer Neg Hx    Social History   Tobacco Use  . Smoking status: Current Every Day Smoker    Packs/day: 0.50    Years: 30.00    Pack years: 15.00    Types: Cigarettes  . Smokeless tobacco: Never Used  Substance Use Topics  . Alcohol use: Yes    Alcohol/week: 1.0 standard drinks    Types: 1 Glasses of wine per week    Comment: occasionally   Allergies  Allergen Reactions  . Tramadol Itching and Rash   Past Medical History:  Diagnosis Date  . Alcohol abuse   . 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 Allergy   . Asthma   . 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 Chronic anemia   . Chronic systolic CHF  (congestive heart failure) ([place])    a. EF 20-25% by echo 12/28/10; b improved to 45-50% on echo in June 2015  . 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 Clotting disorder ([place])   . Coronary artery disease    Cardiac catheterization in November 2016 showed mild 30% proximal LAD stenosis with no evidence of obstructive disease.  . Diabetes mellitus, type 2 ([place])    pt reports his DM is gone  . GI bleed    15 years ago  . Gout   . Hemoptysis    secondary to pulmonary edema  . Hyperlipidemia   . Hypertension   . Nonischemic cardiomyopathy ([place])    minimal coronary disease by cath 12/29/10  . Osteoarthritis   . Pneumonia   . Tobacco abuse   . Vision loss    peripherial vision only left eye.[facility]  artery occusion    Past Surgical History:  Procedure Laterality Date  . CARDIAC CATHETERIZATION    . CARDIAC CATHETERIZATION N/A 01/28/2015   Procedure: Left Heart Cath and Coronary Angiography;  Surgeon: Wellington Hampshire, MD;  Location: [facility];  Service: Cardiovascular;  Laterality: N/A;  . COLONOSCOPY    . HIP ARTHROPLASTY Right 03/15/2013   Procedure: ARTHROPLASTY BIPOLAR HIP;  Surgeon: [name], MD;  Location: [facility];  Service: Orthopedics;  Laterality: Right;  . TOTAL SHOULDER ARTHROPLASTY Right 09/01/2015   Procedure: RIGHT TOTAL SHOULDER ARTHROPLASTY;  Surgeon: [name], MD;  Location: [facility];  Service: Orthopedics;  Laterality: Right;   Family History  Problem Relation Age of Onset  . Diabetes Mother   . Hypertension Mother   . Diabetes Father   . Hypertension Father   . Diabetes Sister   . Dementia Brother   . Cancer Neg Hx   . Heart disease Neg Hx   . Stroke Neg Hx   . Colon cancer Neg Hx   . Esophageal cancer Neg Hx   . Rectal cancer Neg Hx   . Stomach cancer Neg Hx    Social History   Tobacco Use  . Smoking status: Current Every Day Smoker    Packs/day: 0.50    Years: 30.00    Pack  years: 15.00    Types: Cigarettes  . Smokeless tobacco: Never Used  Substance Use Topics  . Alcohol use: Yes    Alcohol/week: 1.0 standard drinks    Types: 1 Glasses of wine per week    Comment: occasionally   Allergies  Allergen Reactions  . Tramadol Itching and Rash   Prior to Admission medications  Medication Sig Start Date End Date Taking? Authorizing Provider  albuterol (PROVENTIL HFA;VENTOLIN HFA) 108 (90 BASE) MCG/ACT inhaler Inhale 2 puffs into the lungs every 6 (six) hours as needed for wheezing or shortness of breath. Take 2 puffs every 5-10 minutes up to 6 puffs total over 15 minutes when needed.  Use with a spacer. Patient taking differently: Inhale 2 puffs into the lungs every 6 (six) hours as needed for wheezing or shortness of breath. Take 2 puffs up to 6 puffs total over 15 minutes when needed.  Use with a spacer. 01/24/15  Yes Ahmed Prima, MD  allopurinol (ZYLOPRIM) 100 MG tablet TAKE 2 TABLETS BY MOUTH ONCE A DAY 01/27/18  Yes [name], NP  aspirin EC 325 MG tablet Take 162.5 mg by mouth daily.   Yes [provider]  atorvastatin (LIPITOR) 20 MG tablet Take 1 tablet (20 mg total) by mouth daily. 06/19/17 10/16/18 Yes Wellington Hampshire, MD  carvedilol (COREG) 12.5 MG tablet TAKE 1 TABLET BY MOUTH TWICE A DAY WITH A MEAL 04/14/18  Yes Baity, Coralie Keens, NP  cetirizine (ZYRTEC) 10 MG tablet Take 1 tablet (10 mg total) by mouth daily. 06/07/16  Yes Hackney, Tina A, FNP  fenofibrate (TRICOR) 145 MG tablet Take 1 tablet (145 mg total) by mouth daily. 01/22/18  Yes [name], NP  fluconazole (DIFLUCAN) 100 MG tablet Take 1 tablet by mouth 2 (two) times a week. 04/14/18  Yes [provider]  fluticasone (FLONASE) 50 MCG/ACT nasal spray Place 1 spray into both nostrils 2 (two) times daily. Patient taking differently: Place 1 spray into both nostrils 2 (two) times daily as needed for allergies.  03/29/16  Yes Cuthriell, Charline Bills, PA-C  folic acid  (FOLVITE) 1 MG tablet Take 1 tablet (1 mg total) by mouth daily. 09/20/16  Yes Alisa Graff, FNP  Multiple Vitamin (MULTIVITAMIN WITH MINERALS) TABS tablet Take 1 tablet by mouth daily. Men's One a Day   Yes [provider]  naproxen (NAPROSYN) 500 MG tablet Take 500 mg by mouth 2 (two) times daily as needed for moderate pain (pain).    Yes [provider]  Omega-3 Fatty Acids (FISH OIL PO) Take 1 capsule by mouth daily.   Yes [provider]  sacubitril-valsartan (ENTRESTO) 49-51 MG Take 1 tablet by mouth 2 (two) times daily. 11/26/17  Yes Darylene Price A, FNP  spironolactone (ALDACTONE) 25 MG tablet Take 0.5 tablets (12.5 mg total) by mouth daily. 01/08/18  Yes Darylene Price A, FNP  triamcinolone ointment (KENALOG) 0.1 % Apply 1 application topically daily. 04/03/18  Yes [provider]     Review of Systems  Constitutional: Positive for fatigue. Negative for appetite change.  HENT: Negative for congestion, postnasal drip and sore throat.   Eyes: Negative.   Respiratory: Negative for cough, chest tightness and shortness of breath.   Cardiovascular: Negative for chest pain, palpitations and leg swelling.  Gastrointestinal: Negative for abdominal distention and abdominal pain.  Endocrine: Negative.   Genitourinary: Negative.   Musculoskeletal: Negative for back pain and neck pain.  Skin: Negative.   Allergic/Immunologic: Negative.   Neurological: Positive for light-headedness (when getting up too quickly). Negative for dizziness and headaches.  Hematological: Negative for adenopathy. Does not bruise/bleed easily.  Psychiatric/Behavioral: Negative for dysphoric mood, sleep disturbance (sleeping on 1 pillow) and suicidal ideas. The patient is not nervous/anxious.     Vitals:   04/16/18 1000  BP: (!) 147/97  Pulse: 80  Resp: 18  SpO2: 100%  Weight: 189 lb 6 oz (85.9 kg)  Height: 6\' 4"  (1.93 m)   Wt Readings from Last 3 Encounters:  04/16/18 189 lb 6  oz (85.9 kg)  10/15/17 173 lb 4 oz (78.6 kg)  07/30/17 163 lb (73.9 kg)   Lab Results  Component Value Date   CREATININE 1.24 03/05/2018   CREATININE 1.39 07/30/2017   CREATININE 0.99 06/18/2017    Physical Exam  Constitutional: He is oriented to person, place, and time. He appears well-developed and well-nourished.  HENT:  Head: Normocephalic and atraumatic.  Neck: Normal range of motion. Neck supple. No JVD present.  Cardiovascular: Normal rate and regular rhythm.  Pulmonary/Chest: Effort normal. He has no wheezes. He has no rales.  Abdominal: Soft. He exhibits no distension. There is no abdominal tenderness.  Musculoskeletal:        General: No tenderness or edema.  Neurological: He is alert and oriented to person, place, and time.  Skin: Skin is warm and dry.  Psychiatric: He has a normal mood and affect. His behavior is normal. Thought content normal.  Nursing note and vitals reviewed.    Assessment & Plan:  1: Chronic heart failure with reduced ejection fraction- - NYHA class II - euvolemic today - continue weighing daily and call for an overnight weight gain of >2 pounds/weekly weight gain of >5 pounds.  - weight up 16 pounds from last visit 6 months ago - not adding any salt to his food - saw cardiologist Fletcher Anon) 06/18/17 - will increase his entresto to 97/103mg  BID. He's to finish out his current bottle of 49/51mg  as he says that he has about 1 week's worth left - will get BMP at his next visit - consider titrating up carvedilol at future visits - patient says that he's received his flu vaccine for this season  2: HTN- - BP mildly elevated today; increasing entresto per above - BMP from 03/05/18 reviewed and shows sodium 139, potassium 4.4,creatinine 1.24 and GFR 75.93 - saw his PCP Garnette Gunner) 02/13/17  3: Tobacco use- - continues to smoke about 1 ppd of cigarettes  - reports never quitting before and doesn't desire to quit at this time - complete cessation  discussed for 3 minutes with him  Patient did not bring his medications nor a list. Each medication was verbally reviewed with the patient and he was encouraged to bring the bottles to every visit to confirm accuracy of list.  Return in 6 weeks or sooner for any questions/problems before then.

## 2018-04-16 NOTE — Patient Instructions (Addendum)
Continue weighing daily and call for an overnight weight gain of > 2 pounds or a weekly weight gain of >5 pounds.  Finish current entresto dose and then pick up the new prescription which will be the 97/103mg  dose. You will continue to take it as 1 tablet twice daily.

## 2018-04-22 ENCOUNTER — Other Ambulatory Visit: Payer: Self-pay | Admitting: Internal Medicine

## 2018-04-22 DIAGNOSIS — E782 Mixed hyperlipidemia: Secondary | ICD-10-CM

## 2018-04-28 ENCOUNTER — Other Ambulatory Visit (INDEPENDENT_AMBULATORY_CARE_PROVIDER_SITE_OTHER): Payer: Medicare Other

## 2018-04-28 DIAGNOSIS — E782 Mixed hyperlipidemia: Secondary | ICD-10-CM | POA: Diagnosis not present

## 2018-04-28 LAB — LDL CHOLESTEROL, DIRECT: Direct LDL: 123 mg/dL

## 2018-04-28 LAB — LIPID PANEL
Cholesterol: 223 mg/dL — ABNORMAL HIGH (ref 0–200)
HDL: 27.2 mg/dL — ABNORMAL LOW (ref >39.00)
NonHDL: 195.32
Total CHOL/HDL Ratio: 8
Triglycerides: 273 mg/dL — ABNORMAL HIGH (ref 0.0–149.0)
VLDL: 54.6 mg/dL — ABNORMAL HIGH (ref 0.0–40.0)

## 2018-04-28 LAB — COMPREHENSIVE METABOLIC PANEL
ALT: 17 U/L (ref 0–53)
AST: 18 U/L (ref 0–37)
Albumin: 4 g/dL (ref 3.5–5.2)
Alkaline Phosphatase: 60 U/L (ref 39–117)
BUN: 16 mg/dL (ref 6–23)
CHLORIDE: 106 meq/L (ref 96–112)
CO2: 27 mEq/L (ref 19–32)
Calcium: 9.5 mg/dL (ref 8.4–10.5)
Creatinine, Ser: 1.1 mg/dL (ref 0.40–1.50)
GFR: 81.99 mL/min (ref >60.00)
Glucose, Bld: 144 mg/dL — ABNORMAL HIGH (ref 70–99)
Potassium: 4.1 mEq/L (ref 3.5–5.1)
Sodium: 141 mEq/L (ref 135–145)
Total Bilirubin: 0.3 mg/dL (ref 0.2–1.2)
Total Protein: 6.8 g/dL (ref 6.0–8.3)

## 2018-05-05 ENCOUNTER — Other Ambulatory Visit: Payer: Self-pay | Admitting: Internal Medicine

## 2018-05-27 NOTE — Progress Notes (Signed)
Patient ID: WILLAM MUNFORD, male    DOB: [birth year], 63 y.o.   MRN: [id number]  HPI  Mr Brix is a 63 y/o male with a history of osteoarthritis, hyperlipidemia, gout, HTN, GI bleed, DM, CAD, anemia, asthma, previous alcohol use, current tobacco use and chronic heart failure.  Reviewed last echo done 08/08/16 which showed an EF of 35-40%. This is an improvement from his last echo which was done 01/26/15 and showed an EF of 25-30% along with trivial MR/ TR. EF had dropped from 2015 when it was 45-50%.   Had a cardiac catheterization done 01/28/15 which showed 10% stenosis in Prox RCA to Mid RCA and 30% stenosis in Prox LAD to Mid LAD which is not much different from previous angiography. Cardiomyopathy felt to be due to previous alcohol use.   Has not been admitted or been in the ED in the last 6 months.  He presents today for a follow-up visit with a chief complaint of minimal fatigue upon moderate exertion. He describes this as chronic in nature having been present for several years. He has no associated symptoms and currently denies any difficulty sleeping, dizziness, abdominal distention, palpitations, pedal edema, chest pain, shortness of breath, cough or weight gain. Hasn't been weighing himself daily. Did not take his medications yet this morning as he says that he woke up late and hasn't eaten yet.   Past Medical History:  Diagnosis Date  . Alcohol abuse   . 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 Allergy   . Asthma   . 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 Chronic anemia   . Chronic systolic CHF (congestive heart failure) ([place])    a. EF 20-25% by echo 12/28/10; b improved to 45-50% on echo in June 2015  . 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 Clotting disorder ([place])   . Coronary artery disease    Cardiac catheterization in November 2016 showed mild 30% proximal LAD stenosis with no evidence of obstructive disease.   . Diabetes mellitus, type 2 ([place])    pt reports his DM is gone  . GI bleed    15 years ago  . Gout   . Hemoptysis    secondary to pulmonary edema  . Hyperlipidemia   . Hypertension   . Nonischemic cardiomyopathy ([place])    minimal coronary disease by cath 12/29/10  . Osteoarthritis   . Pneumonia   . Tobacco abuse   . Vision loss    peripherial vision only left eye.[facility] Retinla  artery occusion    Past Surgical History:  Procedure Laterality Date  . CARDIAC CATHETERIZATION    . CARDIAC CATHETERIZATION N/A 01/28/2015   Procedure: Left Heart Cath and Coronary Angiography;  Surgeon: Wellington Hampshire, MD;  Location: [facility];  Service: Cardiovascular;  Laterality: N/A;  . COLONOSCOPY    . HIP ARTHROPLASTY Right 03/15/2013   Procedure: ARTHROPLASTY BIPOLAR HIP;  Surgeon: [name], MD;  Location: [facility];  Service: Orthopedics;  Laterality: Right;  . TOTAL SHOULDER ARTHROPLASTY Right 09/01/2015   Procedure: RIGHT TOTAL SHOULDER ARTHROPLASTY;  Surgeon: [name], MD;  Location: [facility];  Service: Orthopedics;  Laterality: Right;   Family History  Problem Relation Age of Onset  . Diabetes Mother   . Hypertension Mother   . Diabetes Father   . Hypertension Father   . Diabetes Sister   . Dementia Brother   . Cancer Neg Hx   .  Heart disease Neg Hx   . Stroke Neg Hx   . Colon cancer Neg Hx   . Esophageal cancer Neg Hx   . Rectal cancer Neg Hx   . Stomach cancer Neg Hx    Social History   Tobacco Use  . Smoking status: Current Every Day Smoker    Packs/day: 0.50    Years: 30.00    Pack years: 15.00    Types: Cigarettes  . Smokeless tobacco: Never Used  Substance Use Topics  . Alcohol use: Yes    Alcohol/week: 1.0 standard drinks    Types: 1 Glasses of wine per week    Comment: occasionally   Allergies  Allergen Reactions  . Tramadol Itching and Rash   Past Medical History:  Diagnosis Date  . Alcohol abuse   . Alcoholic cardiomyopathy ([place])  03/15/2013  . Allergy   . Asthma   . 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 Chronic anemia   . Chronic systolic CHF (congestive heart failure) ([place])    a. EF 20-25% by echo 12/28/10; b improved to 45-50% on echo in June 2015  . 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 Clotting disorder ([place])   . Coronary artery disease    Cardiac catheterization in November 2016 showed mild 30% proximal LAD stenosis with no evidence of obstructive disease.  . Diabetes mellitus, type 2 ([place])    pt reports his DM is gone  . GI bleed    15 years ago  . Gout   . Hemoptysis    secondary to pulmonary edema  . Hyperlipidemia   . Hypertension   . Nonischemic cardiomyopathy ([place])    minimal coronary disease by cath 12/29/10  . Osteoarthritis   . Pneumonia   . Tobacco abuse   . Vision loss    peripherial vision only left eye.Centra Retinla  artery occusion    Past Surgical History:  Procedure Laterality Date  . CARDIAC CATHETERIZATION    . CARDIAC CATHETERIZATION N/A 01/28/2015   Procedure: Left Heart Cath and Coronary Angiography;  Surgeon: Wellington Hampshire, MD;  Location: [facility];  Service: Cardiovascular;  Laterality: N/A;  . COLONOSCOPY    . HIP ARTHROPLASTY Right 03/15/2013   Procedure: ARTHROPLASTY BIPOLAR HIP;  Surgeon: [name], MD;  Location: [facility];  Service: Orthopedics;  Laterality: Right;  . TOTAL SHOULDER ARTHROPLASTY Right 09/01/2015   Procedure: RIGHT TOTAL SHOULDER ARTHROPLASTY;  Surgeon: [name], MD;  Location: [facility];  Service: Orthopedics;  Laterality: Right;   Family History  Problem Relation Age of Onset  . Diabetes Mother   . Hypertension Mother   . Diabetes Father   . Hypertension Father   . Diabetes Sister   . Dementia Brother   . Cancer Neg Hx   . Heart disease Neg Hx   . Stroke Neg Hx   . Colon cancer Neg Hx   . Esophageal cancer Neg Hx   . Rectal cancer Neg Hx   . Stomach cancer  Neg Hx    Social History   Tobacco Use  . Smoking status: Current Every Day Smoker    Packs/day: 0.50    Years: 30.00    Pack years: 15.00    Types: Cigarettes  . Smokeless tobacco: Never Used  Substance Use Topics  . Alcohol use: Yes    Alcohol/week: 1.0 standard drinks    Types: 1 Glasses of wine per week  Comment: occasionally   Allergies  Allergen Reactions  . Tramadol Itching and Rash   Prior to Admission medications   Medication Sig Start Date End Date Taking? Authorizing Provider  albuterol (PROVENTIL HFA;VENTOLIN HFA) 108 (90 BASE) MCG/ACT inhaler Inhale 2 puffs into the lungs every 6 (six) hours as needed for wheezing or shortness of breath. Take 2 puffs every 5-10 minutes up to 6 puffs total over 15 minutes when needed.  Use with a spacer. Patient taking differently: Inhale 2 puffs into the lungs every 6 (six) hours as needed for wheezing or shortness of breath. Take 2 puffs up to 6 puffs total over 15 minutes when needed.  Use with a spacer. 01/24/15  Yes Ahmed Prima, MD  allopurinol (ZYLOPRIM) 100 MG tablet TAKE 2 TABLETS BY MOUTH ONCE A DAY 05/06/18  Yes [name], NP  aspirin EC 325 MG tablet Take 162.5 mg by mouth daily.   Yes [provider]  atorvastatin (LIPITOR) 20 MG tablet Take 1 tablet (20 mg total) by mouth daily. 06/19/17 10/16/18 Yes Wellington Hampshire, MD  carvedilol (COREG) 12.5 MG tablet TAKE 1 TABLET BY MOUTH TWICE A DAY WITH A MEAL 04/14/18  Yes Baity, Coralie Keens, NP  cetirizine (ZYRTEC) 10 MG tablet Take 1 tablet (10 mg total) by mouth daily. 06/07/16  Yes Jessieca Rhem A, FNP  fenofibrate (TRICOR) 145 MG tablet Take 1 tablet (145 mg total) by mouth daily. 01/22/18  Yes Baity, Coralie Keens, NP  fluticasone (FLONASE) 50 MCG/ACT nasal spray Place 1 spray into both nostrils 2 (two) times daily. Patient taking differently: Place 1 spray into both nostrils 2 (two) times daily as needed for allergies.  03/29/16  Yes Cuthriell, Charline Bills, PA-C   folic acid (FOLVITE) 1 MG tablet Take 1 tablet (1 mg total) by mouth daily. 09/20/16  Yes Alisa Graff, FNP  Multiple Vitamin (MULTIVITAMIN WITH MINERALS) TABS tablet Take 1 tablet by mouth daily. Men's One a Day   Yes [provider]  naproxen (NAPROSYN) 500 MG tablet Take 500 mg by mouth 2 (two) times daily as needed for moderate pain (pain).    Yes [provider]  Omega-3 Fatty Acids (FISH OIL PO) Take 1 capsule by mouth daily.   Yes [provider]  sacubitril-valsartan (ENTRESTO) 97-103 MG Take 1 tablet by mouth 2 (two) times daily. 04/16/18  Yes Darylene Price A, FNP  spironolactone (ALDACTONE) 25 MG tablet Take 0.5 tablets (12.5 mg total) by mouth daily. 01/08/18  Yes Darylene Price A, FNP  triamcinolone ointment (KENALOG) 0.1 % Apply 1 application topically daily. 04/03/18  Yes [provider]  fluconazole (DIFLUCAN) 100 MG tablet Take 1 tablet by mouth 2 (two) times a week. 04/14/18   [provider]    Review of Systems  Constitutional: Positive for fatigue. Negative for appetite change.  HENT: Negative for congestion, postnasal drip and sore throat.   Eyes: Negative.   Respiratory: Negative for cough, chest tightness and shortness of breath.   Cardiovascular: Negative for chest pain, palpitations and leg swelling.  Gastrointestinal: Negative for abdominal distention and abdominal pain.  Endocrine: Negative.   Genitourinary: Negative.   Musculoskeletal: Negative for back pain and neck pain.  Skin: Negative.   Allergic/Immunologic: Negative.   Neurological: Negative for dizziness, light-headedness and headaches.  Hematological: Negative for adenopathy. Does not bruise/bleed easily.  Psychiatric/Behavioral: Negative for dysphoric mood, sleep disturbance (sleeping on 1 pillow) and suicidal ideas. The patient is not nervous/anxious.  Vitals:   05/28/18 1000  BP: 124/85  Pulse: 96  Resp: 18  SpO2: 100%  Weight: 187 lb 4 oz (84.9 kg)   Height: 6\' 4"  (1.93 m)   Wt Readings from Last 3 Encounters:  05/28/18 187 lb 4 oz (84.9 kg)  04/16/18 189 lb 6 oz (85.9 kg)  10/15/17 173 lb 4 oz (78.6 kg)   Lab Results  Component Value Date   CREATININE 1.10 04/28/2018   CREATININE 1.24 03/05/2018   CREATININE 1.39 07/30/2017   Physical Exam  Constitutional: He is oriented to person, place, and time. He appears well-developed and well-nourished.  HENT:  Head: Normocephalic and atraumatic.  Neck: Normal range of motion. Neck supple. No JVD present.  Cardiovascular: Normal rate and regular rhythm.  Pulmonary/Chest: Effort normal. He has no wheezes. He has no rales.  Abdominal: Soft. He exhibits no distension. There is no abdominal tenderness.  Musculoskeletal:        General: No tenderness or edema.  Neurological: He is alert and oriented to person, place, and time.  Skin: Skin is warm and dry.  Psychiatric: He has a normal mood and affect. His behavior is normal. Thought content normal.  Nursing note and vitals reviewed.    Assessment & Plan:  1: Chronic heart failure with reduced ejection fraction- - NYHA class II - euvolemic today - resume weighing daily and call for an overnight weight gain of >2 pounds/weekly weight gain of >5 pounds.  - weight down 2 pounds from last visit here 6 weeks ago - not adding any salt to his food - saw cardiologist Fletcher Anon) 06/18/17 - entresto increased to 97/103mg  at his last visit - BMP recently done at PCP's office so will not repeat today - consider titrating up carvedilol at future visits; did not titrate up today even though tachycardic as he hadn't taken any of his medications yet - patient says that he's received his flu vaccine for this season - PharmD reconciled medications with the patient  2: HTN- - BP looks good today - BMP from 04/28/2018 reviewed and shows sodium 141, potassium 4.1,creatinine 1.10 and GFR 81.99 - saw his PCP Garnette Gunner) 02/13/17  3: Tobacco use- - continues  to smoke about 1 ppd of cigarettes  - reports never quitting before and doesn't desire to quit at this time - complete cessation discussed for 3 minutes with him  Patient did not bring his medications nor a list. Each medication was verbally reviewed with the patient and he was encouraged to bring the bottles to every visit to confirm accuracy of list.  Return in 4 months or sooner for any questions/problems before then.

## 2018-05-28 ENCOUNTER — Encounter: Payer: Self-pay | Admitting: Pharmacist

## 2018-05-28 ENCOUNTER — Encounter: Payer: Self-pay | Admitting: Family

## 2018-05-28 ENCOUNTER — Ambulatory Visit: Payer: Medicare Other | Attending: Family | Admitting: Family

## 2018-05-28 VITALS — BP 124/85 | HR 96 | Resp 18 | Ht 76.0 in | Wt 187.2 lb

## 2018-05-28 DIAGNOSIS — R5383 Other fatigue: Secondary | ICD-10-CM | POA: Diagnosis present

## 2018-05-28 DIAGNOSIS — M109 Gout, unspecified: Secondary | ICD-10-CM | POA: Diagnosis not present

## 2018-05-28 DIAGNOSIS — I2510 Atherosclerotic heart disease of native coronary artery without angina pectoris: Secondary | ICD-10-CM | POA: Insufficient documentation

## 2018-05-28 DIAGNOSIS — I429 Cardiomyopathy, unspecified: Secondary | ICD-10-CM | POA: Insufficient documentation

## 2018-05-28 DIAGNOSIS — Z8249 Family history of ischemic heart disease and other diseases of the circulatory system: Secondary | ICD-10-CM | POA: Insufficient documentation

## 2018-05-28 DIAGNOSIS — E119 Type 2 diabetes mellitus without complications: Secondary | ICD-10-CM | POA: Insufficient documentation

## 2018-05-28 DIAGNOSIS — F17210 Nicotine dependence, cigarettes, uncomplicated: Secondary | ICD-10-CM | POA: Diagnosis not present

## 2018-05-28 DIAGNOSIS — I110 Hypertensive heart disease with heart failure: Secondary | ICD-10-CM | POA: Insufficient documentation

## 2018-05-28 DIAGNOSIS — Z79899 Other long term (current) drug therapy: Secondary | ICD-10-CM | POA: Diagnosis not present

## 2018-05-28 DIAGNOSIS — Z720 Tobacco use: Secondary | ICD-10-CM

## 2018-05-28 DIAGNOSIS — E785 Hyperlipidemia, unspecified: Secondary | ICD-10-CM | POA: Insufficient documentation

## 2018-05-28 DIAGNOSIS — Z7982 Long term (current) use of aspirin: Secondary | ICD-10-CM | POA: Diagnosis not present

## 2018-05-28 DIAGNOSIS — I10 Essential (primary) hypertension: Secondary | ICD-10-CM

## 2018-05-28 DIAGNOSIS — D649 Anemia, unspecified: Secondary | ICD-10-CM | POA: Insufficient documentation

## 2018-05-28 DIAGNOSIS — J45909 Unspecified asthma, uncomplicated: Secondary | ICD-10-CM | POA: Diagnosis not present

## 2018-05-28 DIAGNOSIS — M1990 Unspecified osteoarthritis, unspecified site: Secondary | ICD-10-CM | POA: Diagnosis not present

## 2018-05-28 DIAGNOSIS — I5022 Chronic systolic (congestive) heart failure: Secondary | ICD-10-CM | POA: Diagnosis not present

## 2018-05-28 NOTE — Progress Notes (Signed)
[facility] - PHARMACIST COUNSELING NOTE  ADHERENCE ASSESSMENT  Adherence strategy: keeps all medications together in bag   Do you ever forget to take your medication? [x] Yes (1) [] No (0)  Do you ever skip doses due to side effects? [] Yes (1) [x] No (0)  Do you have trouble affording your medicines? [] Yes (1) [x] No (0)  Are you ever unable to pick up your medication due to transportation difficulties? [] Yes (1) [x] No (0)  Do you ever stop taking your medications because you don't believe they are helping? [] Yes (1) [x] No (0)  Total score _1______    Recommendations given to patient about increasing adherence: Patient reports occasionally missing a dose. He would like to have 90 day prescriptions so he doesn't have to go to pharmacy every month.  Guideline-Directed Medical Therapy/Evidence Based Medicine    ACE/ARB/ARNI: [name] 97/103 mg twice daily   Beta Blocker: carvedilol 12.5 mg twice daily   Aldosterone Antagonist: spironolactone 12.5 mg daily Diuretic: none    SUBJECTIVE   HPI: Here for follow up appointment. Entresto increased last visit; patient reports that his HR felt like it was higher after initially increasing dose but has since "leveled out."  Past Medical History:  Diagnosis Date  . Alcohol abuse   . 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 Allergy   . Asthma   . 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 Chronic anemia   . Chronic systolic CHF (congestive heart failure) ([place])    a. EF 20-25% by echo 12/28/10; b improved to 45-50% on echo in June 2015  . 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 Clotting disorder ([place])   . Coronary artery disease    Cardiac catheterization in November 2016 showed mild 30% proximal LAD stenosis with no evidence of obstructive disease.  . Diabetes mellitus, type 2 ([place])    pt reports his DM is gone  . GI bleed    15  years ago  . Gout   . Hemoptysis    secondary to pulmonary edema  . Hyperlipidemia   . Hypertension   . Nonischemic cardiomyopathy ([place])    minimal coronary disease by cath 12/29/10  . Osteoarthritis   . Pneumonia   . Tobacco abuse   . Vision loss    peripherial vision only left eye.Centra Retinla  artery occusion         OBJECTIVE    Vital signs: HR 96, BP 124/85, weight (pounds) 187.4  ECHO: Date 08/08/16, EF 35-40%   BMP Latest Ref Rng & Units 04/28/2018 03/05/2018 08/20/2017  Glucose 70 - 99 mg/dL 144(H) 111(H) -  BUN 6 - 23 mg/dL 16 20 -  Creatinine 0.40 - 1.50 mg/dL 1.10 1.24 -  BUN/Creat Ratio 10 - 24 - - -  Sodium 135 - 145 mEq/L 141 139 -  Potassium 3.5 - 5.1 mEq/L 4.1 4.4 4.8  Chloride 96 - 112 mEq/L 106 102 -  CO2 19 - 32 mEq/L 27 32 -  Calcium 8.4 - 10.5 mg/dL 9.5 10.3 -    ASSESSMENT 63 year old male with HFrEF. He felt like HR went up when Entresto increased but has since "leveled out." He reports good compliance with his medications but is tired of taking them all. He would like to be able to pick up 90 days of medications at a time instead of going to the pharmacy every month. He does not weigh daily, but does  weigh about once a week. Has not noticed weight gain. BP 124/85, HR 96 but patient did not take his medications this morning.   PLAN Defer medication adjustments this visit as patient reports not taking his medications this morning. Would consider increasing carvedilol at future visit if BP and HR allow. Prescriptions will be sent to pharmacy for 90 days. We discussed the importance of continuing to take his medications as they help with HF and to keep him out of the hospital.    Time spent: 10 minutes  [place], Pharm.D. 05/28/2018 10:14 AM    Current Outpatient Medications:  .  albuterol (PROVENTIL HFA;VENTOLIN HFA) 108 (90 BASE) MCG/ACT inhaler, Inhale 2 puffs into the lungs every 6 (six) hours as needed for wheezing or shortness of  breath. Take 2 puffs every 5-10 minutes up to 6 puffs total over 15 minutes when needed.  Use with a spacer. (Patient taking differently: Inhale 2 puffs into the lungs every 6 (six) hours as needed for wheezing or shortness of breath. Take 2 puffs up to 6 puffs total over 15 minutes when needed.  Use with a spacer.), Disp: 1 Inhaler, Rfl: 2 .  allopurinol (ZYLOPRIM) 100 MG tablet, TAKE 2 TABLETS BY MOUTH ONCE A DAY, Disp: 180 tablet, Rfl: 0 .  aspirin EC 325 MG tablet, Take 162.5 mg by mouth daily., Disp: , Rfl:  .  atorvastatin (LIPITOR) 20 MG tablet, Take 1 tablet (20 mg total) by mouth daily., Disp: 90 tablet, Rfl: 3 .  carvedilol (COREG) 12.5 MG tablet, TAKE 1 TABLET BY MOUTH TWICE A DAY WITH A MEAL, Disp: 60 tablet, Rfl: 1 .  cetirizine (ZYRTEC) 10 MG tablet, Take 1 tablet (10 mg total) by mouth daily., Disp: 30 tablet, Rfl: 11 .  fenofibrate (TRICOR) 145 MG tablet, Take 1 tablet (145 mg total) by mouth daily., Disp: 30 tablet, Rfl: 2 .  fluconazole (DIFLUCAN) 100 MG tablet, Take 1 tablet by mouth 2 (two) times a week., Disp: , Rfl:  .  fluticasone (FLONASE) 50 MCG/ACT nasal spray, Place 1 spray into both nostrils 2 (two) times daily. (Patient taking differently: Place 1 spray into both nostrils 2 (two) times daily as needed for allergies. ), Disp: 16 g, Rfl: 0 .  folic acid (FOLVITE) 1 MG tablet, Take 1 tablet (1 mg total) by mouth daily., Disp: 30 tablet, Rfl: 3 .  Multiple Vitamin (MULTIVITAMIN WITH MINERALS) TABS tablet, Take 1 tablet by mouth daily. Men's One a Day, Disp: , Rfl:  .  naproxen (NAPROSYN) 500 MG tablet, Take 500 mg by mouth 2 (two) times daily as needed for moderate pain (pain). , Disp: , Rfl:  .  Omega-3 Fatty Acids (FISH OIL PO), Take 1 capsule by mouth daily., Disp: , Rfl:  .  sacubitril-valsartan (ENTRESTO) 97-103 MG, Take 1 tablet by mouth 2 (two) times daily., Disp: 180 tablet, Rfl: 3 .  spironolactone (ALDACTONE) 25 MG tablet, Take 0.5 tablets (12.5 mg total) by mouth  daily., Disp: 15 tablet, Rfl: 6 .  triamcinolone ointment (KENALOG) 0.1 %, Apply 1 application topically daily., Disp: , Rfl:    COUNSELING POINTS/CLINICAL PEARLS  Carvedilol (Goal: weight less than 85 kg is 25 mg BID, weight greater than 85 kg is 50 mg BID)  Patient should avoid activities requiring coordination until drug effects are realized, as drug may cause dizziness.  This drug may cause diarrhea, nausea, vomiting, arthralgia, back pain, myalgia, headache, vision disorder, erectile dysfunction, reduced libido, or fatigue.  Instruct  patient to report signs/symptoms of adverse cardiovascular effects such as hypotension (especially in elderly patients), arrhythmias, syncope, palpitations, angina, or edema.  Drug may mask symptoms of hypoglycemia. Advise diabetic patients to carefully monitor blood sugar levels.  Patient should take drug with food.  Advise patient against sudden discontinuation of drug. Entresto (Goal: 97/103 mg twice daily)  Warn male patient to avoid pregnancy during therapy and to report a pregnancy to a physician.  Advise patient to report symptomatic hypotension.  Side effects may include hyperkalemia, cough, dizziness, or renal failure. Furosemide  Drug causes sun-sensitivity. Advise patient to use sunscreen and avoid tanning beds. Patient should avoid activities requiring coordination until drug effects are realized, as drug may cause dizziness, vertigo, or blurred vision. This drug may cause hyperglycemia, hyperuricemia, constipation, diarrhea, loss of appetite, nausea, vomiting, purpuric disorder, cramps, spasticity, asthenia, headache, paresthesia, or scaling eczema. Instruct patient to report unusual bleeding/bruising or signs/symptoms of hypotension, infection, pancreatitis, or ototoxicity (tinnitus, hearing impairment). Advise patient to report signs/symptoms of a severe skin reactions (flu-like symptoms, spreading red rash, or skin/mucous membrane blistering)  or erythema multiforme. Instruct patient to eat high-potassium foods during drug therapy, as directed by healthcare professional.  Patient should not drink alcohol while taking this drug. Spironolactone  Warn patient to report dehydration, hypotension, or symptoms of worsening renal function.  Counsel male patient to report gynecomastia.  Side effects may include diarrhea, nausea, vomiting, abdominal cramping, fever, leg cramps, lethargy, mental confusion, decreased libido, irregular menses, and rash. Suspension: Tell patient to take drug consistently with respect to food, either before or after a meal.  Advise patient to avoid potassium supplements and foods containing high levels of potassium, including salt substitutes.   DRUGS TO AVOID IN HEART FAILURE  Drug or Class Mechanism  Analgesics . NSAIDs . COX-2 inhibitors . Glucocorticoids  Sodium and water retention, increased systemic vascular resistance, decreased response to diuretics   Diabetes Medications . Metformin . Thiazolidinediones o Rosiglitazone (Avandia) o Pioglitazone (Actos) . DPP4 Inhibitors o Saxagliptin (Onglyza) o Sitagliptin (Januvia)   Lactic acidosis Possible calcium channel blockade   Unknown  Antiarrhythmics . Class I  o Flecainide o Disopyramide . Class III o Sotalol . Other o Dronedarone  Negative inotrope, proarrhythmic   Proarrhythmic, beta blockade  Negative inotrope  Antihypertensives . Alpha Blockers o Doxazosin . Calcium Channel Blockers o Diltiazem o Verapamil o Nifedipine . Central Alpha Adrenergics o Moxonidine . Peripheral Vasodilators o Minoxidil  Increases renin and aldosterone  Negative inotrope    Possible sympathetic withdrawal  Unknown  Anti-infective . Itraconazole . Amphotericin B  Negative inotrope Unknown  Hematologic . Anagrelide . Cilostazol   Possible inhibition of PD IV Inhibition of PD III causing arrhythmias   Neurologic/Psychiatric . Stimulants . Anti-Seizure Drugs o Carbamazepine o Pregabalin . Antidepressants o Tricyclics o Citalopram . Parkinsons o Bromocriptine o Pergolide o Pramipexole . Antipsychotics o Clozapine . Antimigraine o Ergotamine o Methysergide . Appetite suppressants . Bipolar o Lithium  Peripheral alpha and beta agonist activity  Negative inotrope and chronotrope Calcium channel blockade  Negative inotrope, proarrhythmic Dose-dependent QT prolongation  Excessive serotonin activity/valvular damage Excessive serotonin activity/valvular damage Unknown  IgE mediated hypersensitivy, calcium channel blockade  Excessive serotonin activity/valvular damage Excessive serotonin activity/valvular damage Valvular damage  Direct myofibrillar degeneration, adrenergic stimulation  Antimalarials . Chloroquine . Hydroxychloroquine Intracellular inhibition of lysosomal enzymes  Urologic Agents . Alpha Blockers o Doxazosin o Prazosin o Tamsulosin o Terazosin  Increased renin and aldosterone  Adapted from Page Carleene Overlie, et  al. "Drugs That May Cause or Exacerbate Heart Failure: A Scientific Statement from the [employer]." Circulation 2016; 672:S91-Z80. DOI: 10.1161/CIR.0000000000000426   MEDICATION ADHERENCES TIPS AND STRATEGIES 1. Taking medication as prescribed improves patient outcomes in heart failure (reduces hospitalizations, improves symptoms, increases survival) 2. Side effects of medications can be managed by decreasing doses, switching agents, stopping drugs, or adding additional therapy. Please let someone in the [facility] know if you have having bothersome side effects so we can modify your regimen. Do not alter your medication regimen without talking to [place].  3. Medication reminders can help patients remember to take drugs on time. If you are missing or forgetting doses you can try linking behaviors, using pill boxes, or an  electronic reminder like an alarm on your phone or an app. Some people can also get automated phone calls as medication reminders.

## 2018-05-28 NOTE — Patient Instructions (Addendum)
Resume weighing daily and call for an overnight weight gain of > 2 pounds or a weekly weight gain of >5 pounds. 

## 2018-07-07 ENCOUNTER — Other Ambulatory Visit: Payer: Self-pay | Admitting: Internal Medicine

## 2018-07-14 ENCOUNTER — Other Ambulatory Visit: Payer: Self-pay | Admitting: Family

## 2018-07-14 ENCOUNTER — Other Ambulatory Visit: Payer: Self-pay | Admitting: Internal Medicine

## 2018-07-14 MED ORDER — CARVEDILOL 12.5 MG PO TABS: 12.5000 mg | ORAL_TABLET | Freq: Two times a day (BID) | ORAL | 3 refills | Status: DC

## 2018-08-01 ENCOUNTER — Other Ambulatory Visit: Payer: Self-pay | Admitting: Internal Medicine

## 2018-08-22 ENCOUNTER — Telehealth: Payer: Self-pay

## 2018-08-22 NOTE — Telephone Encounter (Signed)
Virtual Visit Pre-Appointment Phone Call  "Login, I am calling you today to discuss your upcoming appointment. We are currently trying to limit exposure to the virus that causes COVID-19 by seeing patients at home rather than in the office."  1. "What is the BEST phone number to call the day of the visit?" - include this in appointment notes  2. Do you have or have access to (through a family member/friend) a smartphone with video capability that we can use for your visit?" a. If yes - list this number in appt notes as cell (if different from BEST phone #) and list the appointment type as a VIDEO visit in appointment notes b. If no - list the appointment type as a PHONE visit in appointment notes  3. Confirm consent - "In the setting of the current Covid19 crisis, you are scheduled for a phone visit with your provider on August 28, 2018 at 10:00AM.  Just as we do with many in-office visits, in order for you to participate in this visit, we must obtain consent.  If you'd like, I can send this to your mychart (if signed up) or email for you to review.  Otherwise, I can obtain your verbal consent now.  All virtual visits are billed to your insurance company just like a normal visit would be.  By agreeing to a virtual visit, we'd like you to understand that the technology does not allow for your provider to perform an examination, and thus may limit your provider's ability to fully assess your condition. If your provider identifies any concerns that need to be evaluated in person, we will make arrangements to do so.  Finally, though the technology is pretty good, we cannot assure that it will always work on either your or our end, and in the setting of a video visit, we may have to convert it to a phone-only visit.  In either situation, we cannot ensure that we have a secure connection.  Are you willing to proceed?" STAFF: Did the patient verbally acknowledge consent to telehealth visit? Document YES/NO  here: YES  4. Advise patient to be prepared - "Two hours prior to your appointment, go ahead and check your blood pressure, pulse, oxygen saturation, and your weight (if you have the equipment to check those) and write them all down. When your visit starts, your provider will ask you for this information. If you have an Apple Watch or Kardia device, please plan to have heart rate information ready on the day of your appointment. Please have a pen and paper handy nearby the day of the visit as well."  5. Give patient instructions for MyChart download to smartphone OR Doximity/[email or web address] as below if video visit (depending on what platform provider is using)  6. Inform patient they will receive a phone call 15 minutes prior to their appointment time (may be from unknown caller ID) so they should be prepared to answer    TELEPHONE CALL NOTE  Travis [name] has been deemed a candidate for a follow-up tele-health visit to limit community exposure during the Covid-19 pandemic. I spoke with the patient via phone to ensure availability of phone/video source, confirm preferred email & phone number, and discuss instructions and expectations.  I reminded Travis [name] to be prepared with any vital sign and/or heart rhythm information that could potentially be obtained via home monitoring, at the time of his visit. I reminded Travis [name] to expect a phone call prior to  his visit.  Travis [name] 08/22/2018 9:03 AM    FULL LENGTH CONSENT FOR TELE-HEALTH VISIT   I hereby voluntarily request, consent and authorize [facility] and its employed or contracted physicians, physician assistants, nurse practitioners or other licensed health care professionals (the Practitioner), to provide me with telemedicine health care services (the Services") as deemed necessary by the treating Practitioner. I acknowledge and consent to receive the Services by the Practitioner via telemedicine. I understand that the  telemedicine visit will involve communicating with the Practitioner through live audiovisual communication technology and the disclosure of certain medical information by electronic transmission. I acknowledge that I have been given the opportunity to request an in-person assessment or other available alternative prior to the telemedicine visit and am voluntarily participating in the telemedicine visit.  I understand that I have the right to withhold or withdraw my consent to the use of telemedicine in the course of my care at any time, without affecting my right to future care or treatment, and that the Practitioner or I may terminate the telemedicine visit at any time. I understand that I have the right to inspect all information obtained and/or recorded in the course of the telemedicine visit and may receive copies of available information for a reasonable fee.  I understand that some of the potential risks of receiving the Services via telemedicine include:   Delay or interruption in medical evaluation due to technological equipment failure or disruption;  Information transmitted may not be sufficient (e.g. poor resolution of images) to allow for appropriate medical decision making by the Practitioner; and/or   In rare instances, security protocols could fail, causing a breach of personal health information.  Furthermore, I acknowledge that it is my responsibility to provide information about my medical history, conditions and care that is complete and accurate to the best of my ability. I acknowledge that Practitioner's advice, recommendations, and/or decision may be based on factors not within their control, such as incomplete or inaccurate data provided by me or distortions of diagnostic images or specimens that may result from electronic transmissions. I understand that the practice of medicine is not an exact science and that Practitioner makes no warranties or guarantees regarding treatment  outcomes. I acknowledge that I will receive a copy of this consent concurrently upon execution via email to the email address I last provided but may also request a printed copy by calling the office of [facility].    I understand that my insurance will be billed for this visit.   I have read or had this consent read to me.  I understand the contents of this consent, which adequately explains the benefits and risks of the Services being provided via telemedicine.   I have been provided ample opportunity to ask questions regarding this consent and the Services and have had my questions answered to my satisfaction.  I give my informed consent for the services to be provided through the use of telemedicine in my medical care  By participating in this telemedicine visit I agree to the above.

## 2018-08-26 ENCOUNTER — Other Ambulatory Visit: Payer: Self-pay | Admitting: Internal Medicine

## 2018-08-28 ENCOUNTER — Other Ambulatory Visit: Payer: Self-pay

## 2018-08-28 ENCOUNTER — Telehealth (INDEPENDENT_AMBULATORY_CARE_PROVIDER_SITE_OTHER): Payer: Medicare Other | Admitting: Cardiovascular Disease

## 2018-08-28 ENCOUNTER — Encounter: Payer: Self-pay | Admitting: Cardiovascular Disease

## 2018-08-28 VITALS — Ht 76.0 in | Wt 180.0 lb

## 2018-08-28 DIAGNOSIS — I5022 Chronic systolic (congestive) heart failure: Secondary | ICD-10-CM

## 2018-08-28 NOTE — Patient Instructions (Signed)
Medication Instructions:  Continue same medications If you need a refill on your cardiac medications before your next appointment, please call your pharmacy.   Lab work: None If you have labs (blood work) drawn today and your tests are completely normal, you will receive your results only by: . MyChart Message (if you have MyChart) OR . A paper copy in the mail If you have any lab test that is abnormal or we need to change your treatment, we will call you to review the results.  Testing/Procedures: None  Follow-Up: At [facility], you and your health needs are our priority.  As part of our continuing mission to provide you with exceptional heart care, we have created designated Provider Care Teams.  These Care Teams include your primary Cardiologist (physician) and Advanced Practice Providers (APPs -  Physician Assistants and Nurse Practitioners) who all work together to provide you with the care you need, when you need it. You will need a follow up appointment in 6 months.  Please call our office 2 months in advance to schedule this appointment.  You may see Aubrynn Katona, MD or one of the following Advanced Practice Providers on your designated Care Team:   Christopher Berge, NP Ryan Dunn, PA-C . Jacquelyn Visser, PA-C   

## 2018-08-28 NOTE — Progress Notes (Signed)
Virtual Visit via Telephone Note   This visit type was conducted due to national recommendations for restrictions regarding the COVID-19 Pandemic (e.g. social distancing) in an effort to limit this patient's exposure and mitigate transmission in our community.  Due to his co-morbid illnesses, this patient is at least at moderate risk for complications without adequate follow up.  This format is felt to be most appropriate for this patient at this time.  The patient did not have access to video technology/had technical difficulties with video requiring transitioning to audio format only (telephone).  All issues noted in this document were discussed and addressed.  No physical exam could be performed with this format.  Please refer to the patient's chart for his  consent to telehealth for [facility].   Date:  08/28/2018   ID:  [name], DOB 06-Jun-1955, MRN [id number]  Patient Location: Home Provider Location: Office  PCP:  [name], NP  Cardiologist:  Kathlyn Sacramento, MD  Electrophysiologist:  None   Evaluation Performed:  Follow-Up Visit  Chief Complaint: No complaints today  History of Present Illness:    Travis [name] is a 63 y.o. 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 He has known history of mild nonobstructive one-vessel coronary artery disease, history of TIA, tobacco use, previous excessive alcohol use and hyperlipidemia. Echocardiogram in November 2016 showed worsening LV systolic dysfunction with an ejection fraction of 25-30%.  Cardiac catheterization showed mild nonobstructive one-vessel coronary artery disease. LVEDP was mildly elevated.  Most recent echocardiogram in May 2018 showed an EF of 35-40% with grade 1 diastolic dysfunction.  He cut down on drinking alcohol according to him and mainly he drinks on weekends.  He continues to smoke.  He has been  taking his medications regularly and had no hospitalizations over the last year.  No chest pain or significant dyspnea.  He follows with the heart failure clinic closely.    The patient does not have symptoms concerning for COVID-19 infection (fever, chills, cough, or new shortness of breath).    Past Medical History:  Diagnosis Date  . Alcohol abuse   . 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 Allergy   . Asthma   . 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 Chronic anemia   . Chronic systolic CHF (congestive heart failure) ([place])    a. EF 20-25% by echo 12/28/10; b improved to 45-50% on echo in June 2015  . 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 Clotting disorder ([place])   . Coronary artery disease    Cardiac catheterization in November 2016 showed mild 30% proximal LAD stenosis with no evidence of obstructive disease.  . Diabetes mellitus, type 2 ([place])    pt reports his DM is gone  . GI bleed    15 years ago  . Gout   . Hemoptysis    secondary to pulmonary edema  . Hyperlipidemia   . Hypertension   . Nonischemic cardiomyopathy ([place])    minimal coronary disease by cath 12/29/10  . Osteoarthritis   . Pneumonia   . Tobacco abuse   . Vision loss    peripherial vision only left eye.[facility]  artery occusion    Past Surgical History:  Procedure Laterality Date  . CARDIAC CATHETERIZATION    . CARDIAC CATHETERIZATION N/A 01/28/2015   Procedure: Left Heart Cath and Coronary  Angiography;  Surgeon: Wellington Hampshire, MD;  Location: [facility];  Service: Cardiovascular;  Laterality: N/A;  . COLONOSCOPY    . HIP ARTHROPLASTY Right 03/15/2013   Procedure: ARTHROPLASTY BIPOLAR HIP;  Surgeon: [name], MD;  Location: [facility];  Service: Orthopedics;  Laterality: Right;  . TOTAL SHOULDER ARTHROPLASTY Right 09/01/2015   Procedure: RIGHT TOTAL SHOULDER ARTHROPLASTY;  Surgeon: [name], MD;  Location:  [facility];  Service: Orthopedics;  Laterality: Right;     Current Meds  Medication Sig  . albuterol (PROVENTIL HFA;VENTOLIN HFA) 108 (90 BASE) MCG/ACT inhaler Inhale 2 puffs into the lungs every 6 (six) hours as needed for wheezing or shortness of breath. Take 2 puffs every 5-10 minutes up to 6 puffs total over 15 minutes when needed.  Use with a spacer. (Patient taking differently: Inhale 2 puffs into the lungs every 6 (six) hours as needed for wheezing or shortness of breath. Take 2 puffs up to 6 puffs total over 15 minutes when needed.  Use with a spacer.)  . allopurinol (ZYLOPRIM) 100 MG tablet TAKE 2 TABLETS BY MOUTH ONCE A DAY  . aspirin EC 325 MG tablet Take 162.5 mg by mouth daily.  [name] atorvastatin (LIPITOR) 20 MG tablet Take 1 tablet (20 mg total) by mouth daily.  . carvedilol (COREG) 12.5 MG tablet Take 1 tablet (12.5 mg total) by mouth 2 (two) times daily with a meal.  . cetirizine (ZYRTEC) 10 MG tablet Take 1 tablet (10 mg total) by mouth daily.  . fenofibrate (TRICOR) 145 MG tablet TAKE 1 TABLET BY MOUTH ONCE A DAY  . fluconazole (DIFLUCAN) 100 MG tablet Take 1 tablet by mouth 2 (two) times a week.  . fluticasone (FLONASE) 50 MCG/ACT nasal spray Place 1 spray into both nostrils 2 (two) times daily. (Patient taking differently: Place 1 spray into both nostrils 2 (two) times daily as needed for allergies. )  . folic acid (FOLVITE) 1 MG tablet Take 1 tablet (1 mg total) by mouth daily.  . Multiple Vitamin (MULTIVITAMIN WITH MINERALS) TABS tablet Take 1 tablet by mouth daily. Men's One a Day  . naproxen (NAPROSYN) 500 MG tablet Take 500 mg by mouth 2 (two) times daily as needed for moderate pain (pain).   . Omega-3 Fatty Acids (FISH OIL PO) Take 1 capsule by mouth daily.  . sacubitril-valsartan (ENTRESTO) 97-103 MG Take 1 tablet by mouth 2 (two) times daily.  [name] spironolactone (ALDACTONE) 25 MG tablet TAKE 1/2 TABLET BY MOUTH DAILY  . triamcinolone ointment (KENALOG) 0.1 % Apply 1  application topically daily.     Allergies:   Tramadol   Social History   Tobacco Use  . Smoking status: Current Every Day Smoker    Packs/day: 0.50    Years: 30.00    Pack years: 15.00    Types: Cigarettes  . Smokeless tobacco: Never Used  Substance Use Topics  . Alcohol use: Yes    Alcohol/week: 1.0 standard drinks    Types: 1 Glasses of wine per week    Comment: occasionally  . Drug use: Yes    Types: Marijuana    Comment: 1 blunt 1-2 times a month- 08/29/15/17- "last week" 04/17/17-2 weeks ago     Family Hx: The patient's family history includes Dementia in his brother; Diabetes in his father, mother, and sister; Hypertension in his father and mother. There is no history of Cancer, Heart disease, Stroke, Colon cancer, Esophageal cancer, Rectal cancer, or Stomach cancer.  ROS:  Please see the history of present illness.     All other systems reviewed and are negative.   Prior CV studies:   The following studies were reviewed today:    Labs/Other Tests and Data Reviewed:    EKG:  No ECG reviewed.  Recent Labs: 04/28/2018: ALT 17; BUN 16; Creatinine, Ser 1.10; Potassium 4.1; Sodium 141   Recent Lipid Panel Lab Results  Component Value Date/Time   CHOL 223 (H) 04/28/2018 10:09 AM   CHOL 138 06/18/2017 02:25 PM   TRIG 273.0 (H) 04/28/2018 10:09 AM   HDL 27.20 (L) 04/28/2018 10:09 AM   HDL 67 06/18/2017 02:25 PM   CHOLHDL 8 04/28/2018 10:09 AM   LDLCALC 51 06/18/2017 02:25 PM   LDLDIRECT 123.0 04/28/2018 10:09 AM    Wt Readings from Last 3 Encounters:  08/28/18 180 lb (81.6 kg)  05/28/18 187 lb 4 oz (84.9 kg)  04/16/18 189 lb 6 oz (85.9 kg)     Objective:    Vital Signs:  Ht 6\' 4"  (1.93 m)   Wt 180 lb (81.6 kg)   BMI 21.91 kg/m    VITAL SIGNS:  reviewed  ASSESSMENT & PLAN:    1.   Chronic systolic heart failure: Due to suspected alcohol-induced cardiomyopathy.  He is on optimal medical therapy and appears to be euvolemic without a loop diuretic.  Currently New York Heart Association class II.  Most recent ejection fraction was 35-40%. Continue treatment with carvedilol, Entresto and spironolactone.  I reviewed his labs done in February which were unremarkable  2. Essential hypertension: Blood pressure is controlled on current medications.  3. Tobacco use: He continues to smoke and has not been able to quit smoking.   4.  Hyperlipidemia: Currently on atorvastatin and fenofibrate.  COVID-19 Education: The signs and symptoms of COVID-19 were discussed with the patient and how to seek care for testing (follow up with PCP or arrange E-visit).  The importance of social distancing was discussed today.  Time:   Today, I have spent 10 minutes with the patient with telehealth technology discussing the above problems.     Medication Adjustments/Labs and Tests Ordered: Current medicines are reviewed at length with the patient today.  Concerns regarding medicines are outlined above.   Tests Ordered: No orders of the defined types were placed in this encounter.   Medication Changes: No orders of the defined types were placed in this encounter.   Disposition:  Follow up in 6 month(s)  Signed, Kathlyn Sacramento, MD  08/28/2018 10:34 AM    [facility]

## 2018-09-30 ENCOUNTER — Ambulatory Visit: Payer: Medicare Other | Admitting: Family

## 2018-10-09 NOTE — Progress Notes (Signed)
Patient ID: KENJI MAPEL, male    DOB: 09-27-1955, 63 y.o.   MRN: [id number]  HPI  Mr Cordrey is a 63 y/o male with a history of osteoarthritis, hyperlipidemia, gout, HTN, GI bleed, DM, CAD, anemia, asthma, previous alcohol use, current tobacco use and chronic heart failure.  Reviewed last echo done 08/08/16 which showed an EF of 35-40%. This is an improvement from his last echo which was done 01/26/15 and showed an EF of 25-30% along with trivial MR/ TR. EF had dropped from 2015 when it was 45-50%.   Had a cardiac catheterization done 01/28/15 which showed 10% stenosis in Prox RCA to Mid RCA and 30% stenosis in Prox LAD to Mid LAD which is not much different from previous angiography. Cardiomyopathy felt to be due to previous alcohol use.   Has not been admitted or been in the ED in the last 6 months.  He presents today for a follow-up visit with a chief complaint of minimal fatigue upon moderate exertion. He describes this as chronic in nature having been present for several years. He has no other complaints and specifically denies any difficulty sleeping, dizziness, abdominal distention, palpitations, pedal edema, chest pain, shortness of breath or weight gain. He has noticed a decreased appetite but figures it's due to the hot/humid weather. No N/V/D or melena.   Past Medical History:  Diagnosis Date  . Alcohol abuse   . 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 Allergy   . Asthma   . 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 Chronic anemia   . Chronic systolic CHF (congestive heart failure) ([place])    a. EF 20-25% by echo 12/28/10; b improved to 45-50% on echo in June 2015  . 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 Clotting disorder ([place])   . Coronary artery disease    Cardiac catheterization in November 2016 showed mild 30% proximal LAD stenosis with no evidence of obstructive disease.  . Diabetes mellitus, type 2 ([place])    pt  reports his DM is gone  . GI bleed    15 years ago  . Gout   . Hemoptysis    secondary to pulmonary edema  . Hyperlipidemia   . Hypertension   . Nonischemic cardiomyopathy ([place])    minimal coronary disease by cath 12/29/10  . Osteoarthritis   . Pneumonia   . Tobacco abuse   . Vision loss    peripherial vision only left eye.[facility]  artery occusion    Past Surgical History:  Procedure Laterality Date  . CARDIAC CATHETERIZATION    . CARDIAC CATHETERIZATION N/A 01/28/2015   Procedure: Left Heart Cath and Coronary Angiography;  Surgeon: Wellington Hampshire, MD;  Location: [facility];  Service: Cardiovascular;  Laterality: N/A;  . COLONOSCOPY    . HIP ARTHROPLASTY Right 03/15/2013   Procedure: ARTHROPLASTY BIPOLAR HIP;  Surgeon: [name], MD;  Location: [facility];  Service: Orthopedics;  Laterality: Right;  . TOTAL SHOULDER ARTHROPLASTY Right 09/01/2015   Procedure: RIGHT TOTAL SHOULDER ARTHROPLASTY;  Surgeon: [name], MD;  Location: [facility];  Service: Orthopedics;  Laterality: Right;   Family History  Problem Relation Age of Onset  . Diabetes Mother   . Hypertension Mother   . Diabetes Father   . Hypertension Father   . Diabetes Sister   . Dementia Brother   . Cancer Neg Hx   . Heart disease Neg Hx   .  Stroke Neg Hx   . Colon cancer Neg Hx   . Esophageal cancer Neg Hx   . Rectal cancer Neg Hx   . Stomach cancer Neg Hx    Social History   Tobacco Use  . Smoking status: Current Every Day Smoker    Packs/day: 0.50    Years: 30.00    Pack years: 15.00    Types: Cigarettes  . Smokeless tobacco: Never Used  Substance Use Topics  . Alcohol use: Yes    Alcohol/week: 1.0 standard drinks    Types: 1 Glasses of wine per week    Comment: occasionally   Allergies  Allergen Reactions  . Tramadol Itching and Rash   Past Medical History:  Diagnosis Date  . Alcohol abuse   . 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 Allergy   . Asthma   .  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 Chronic anemia   . Chronic systolic CHF (congestive heart failure) ([place])    a. EF 20-25% by echo 12/28/10; b improved to 45-50% on echo in June 2015  . 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 Clotting disorder ([place])   . Coronary artery disease    Cardiac catheterization in November 2016 showed mild 30% proximal LAD stenosis with no evidence of obstructive disease.  . Diabetes mellitus, type 2 ([place])    pt reports his DM is gone  . GI bleed    15 years ago  . Gout   . Hemoptysis    secondary to pulmonary edema  . Hyperlipidemia   . Hypertension   . Nonischemic cardiomyopathy ([place])    minimal coronary disease by cath 12/29/10  . Osteoarthritis   . Pneumonia   . Tobacco abuse   . Vision loss    peripherial vision only left eye.[facility]  artery occusion    Past Surgical History:  Procedure Laterality Date  . CARDIAC CATHETERIZATION    . CARDIAC CATHETERIZATION N/A 01/28/2015   Procedure: Left Heart Cath and Coronary Angiography;  Surgeon: Wellington Hampshire, MD;  Location: [facility];  Service: Cardiovascular;  Laterality: N/A;  . COLONOSCOPY    . HIP ARTHROPLASTY Right 03/15/2013   Procedure: ARTHROPLASTY BIPOLAR HIP;  Surgeon: [name], MD;  Location: [facility];  Service: Orthopedics;  Laterality: Right;  . TOTAL SHOULDER ARTHROPLASTY Right 09/01/2015   Procedure: RIGHT TOTAL SHOULDER ARTHROPLASTY;  Surgeon: [name], MD;  Location: [facility];  Service: Orthopedics;  Laterality: Right;   Family History  Problem Relation Age of Onset  . Diabetes Mother   . Hypertension Mother   . Diabetes Father   . Hypertension Father   . Diabetes Sister   . Dementia Brother   . Cancer Neg Hx   . Heart disease Neg Hx   . Stroke Neg Hx   . Colon cancer Neg Hx   . Esophageal cancer Neg Hx   . Rectal cancer Neg Hx   . Stomach cancer Neg Hx    Social History   Tobacco  Use  . Smoking status: Current Every Day Smoker    Packs/day: 0.50    Years: 30.00    Pack years: 15.00    Types: Cigarettes  . Smokeless tobacco: Never Used  Substance Use Topics  . Alcohol use: Yes    Alcohol/week: 1.0 standard drinks    Types: 1 Glasses of wine per week    Comment: occasionally   Allergies  Allergen Reactions  . Tramadol Itching and Rash   Prior to Admission medications   Medication Sig Start Date End Date Taking? Authorizing Provider  albuterol (PROVENTIL HFA;VENTOLIN HFA) 108 (90 BASE) MCG/ACT inhaler Inhale 2 puffs into the lungs every 6 (six) hours as needed for wheezing or shortness of breath. Take 2 puffs every 5-10 minutes up to 6 puffs total over 15 minutes when needed.  Use with a spacer. Patient taking differently: Inhale 2 puffs into the lungs every 6 (six) hours as needed for wheezing or shortness of breath. Take 2 puffs up to 6 puffs total over 15 minutes when needed.  Use with a spacer. 01/24/15  Yes Ahmed Prima, MD  allopurinol (ZYLOPRIM) 100 MG tablet TAKE 2 TABLETS BY MOUTH ONCE A DAY 08/01/18  Yes [name], NP  aspirin EC 325 MG tablet Take 162.5 mg by mouth daily.   Yes [provider]  atorvastatin (LIPITOR) 20 MG tablet Take 1 tablet (20 mg total) by mouth daily. 06/19/17 10/16/18 Yes Wellington Hampshire, MD  carvedilol (COREG) 12.5 MG tablet Take 1 tablet (12.5 mg total) by mouth 2 (two) times daily with a meal. 07/14/18  Yes Darylene Price A, FNP  cetirizine (ZYRTEC) 10 MG tablet Take 1 tablet (10 mg total) by mouth daily. 06/07/16  Yes Darylene Price A, FNP  fenofibrate (TRICOR) 145 MG tablet TAKE 1 TABLET BY MOUTH ONCE A DAY 08/26/18  Yes Baity, Coralie Keens, NP  fluconazole (DIFLUCAN) 100 MG tablet Take 1 tablet by mouth 2 (two) times a week. 04/14/18  Yes [provider]  fluticasone (FLONASE) 50 MCG/ACT nasal spray Place 1 spray into both nostrils 2 (two) times daily. Patient taking differently: Place 1 spray into both  nostrils 2 (two) times daily as needed for allergies.  03/29/16  Yes Cuthriell, Charline Bills, PA-C  folic acid (FOLVITE) 1 MG tablet Take 1 tablet (1 mg total) by mouth daily. 09/20/16  Yes Alisa Graff, FNP  Multiple Vitamin (MULTIVITAMIN WITH MINERALS) TABS tablet Take 1 tablet by mouth daily. Men's One a Day   Yes [provider]  naproxen (NAPROSYN) 500 MG tablet Take 500 mg by mouth 2 (two) times daily as needed for moderate pain (pain).    Yes [provider]  Omega-3 Fatty Acids (FISH OIL PO) Take 1 capsule by mouth daily.   Yes [provider]  sacubitril-valsartan (ENTRESTO) 97-103 MG Take 1 tablet by mouth 2 (two) times daily. 04/16/18  Yes Darylene Price A, FNP  spironolactone (ALDACTONE) 25 MG tablet TAKE 1/2 TABLET BY MOUTH DAILY 08/26/18  Yes Darylene Price A, FNP  triamcinolone ointment (KENALOG) 0.1 % Apply 1 application topically daily. 04/03/18  Yes [provider]    Review of Systems  Constitutional: Positive for appetite change (decreased) and fatigue.  HENT: Negative for congestion, postnasal drip and sore throat.   Eyes: Negative.   Respiratory: Negative for cough, chest tightness and shortness of breath.   Cardiovascular: Negative for chest pain, palpitations and leg swelling.  Gastrointestinal: Negative for abdominal distention and abdominal pain.  Endocrine: Negative.   Genitourinary: Negative.   Musculoskeletal: Negative for back pain and neck pain.  Skin: Negative.   Allergic/Immunologic: Negative.   Neurological: Negative for dizziness, light-headedness and headaches.  Hematological: Negative for adenopathy. Does not bruise/bleed easily.  Psychiatric/Behavioral: Negative for dysphoric mood, sleep disturbance (sleeping on 1 pillow) and suicidal ideas. The patient is not nervous/anxious.    Vitals:   10/10/18 1414  BP: 101/72  Pulse: 96  Resp: 18  SpO2: 100%  Weight: 170 lb 4 oz (77.2 kg)  Height: 6\' 4"  (1.93 m)   Wt Readings  from Last 3 Encounters:  10/10/18 170 lb 4 oz (77.2 kg)  08/28/18 180 lb (81.6 kg)  05/28/18 187 lb 4 oz (84.9 kg)   Lab Results  Component Value Date   CREATININE 1.10 04/28/2018   CREATININE 1.24 03/05/2018   CREATININE 1.39 07/30/2017    Physical Exam  Constitutional: He is oriented to person, place, and time. He appears well-developed and well-nourished.  HENT:  Head: Normocephalic and atraumatic.  Neck: Normal range of motion. Neck supple. No JVD present.  Cardiovascular: Normal rate and regular rhythm.  Pulmonary/Chest: Effort normal. He has no wheezes. He has no rales.  Abdominal: Soft. He exhibits no distension. There is no abdominal tenderness.  Musculoskeletal:        General: No tenderness or edema.  Neurological: He is alert and oriented to person, place, and time.  Skin: Skin is warm and dry.  Psychiatric: He has a normal mood and affect. His behavior is normal. Thought content normal.  Nursing note and vitals reviewed.    Assessment & Plan:  1: Chronic heart failure with reduced ejection fraction- - NYHA class II - euvolemic today - resume weighing daily and call for an overnight weight gain of >2 pounds/weekly weight gain of >5 pounds.  - weight down 17 pounds from last visit here 4 months ago - not adding any salt to his food - had telemedicine visit with cardiologist Fletcher Anon) 08/28/2018  2: HTN- - BP on the low side today - BMP from 04/28/2018 reviewed and shows sodium 141, potassium 4.1,creatinine 1.10 and GFR 81.99 - saw his PCP Garnette Gunner) 02/13/17  3: Tobacco use- - continues to smoke about 1 ppd of cigarettes  - reports never quitting before and doesn't desire to quit at this time - complete cessation discussed for 3 minutes with him  4: Weight loss- - patient has lost 17 pounds by our scale in the last 4 months - he says that his appetite is decreased but attributes that to the hot/humid weather - advised patient to weigh daily and should his weight  loss continue, to make an appointment with his PCP for further work-up  Patient did not bring his medications nor a list. Each medication was verbally reviewed with the patient and he was encouraged to bring the bottles to every visit to confirm accuracy of list.  Return in 6 months or sooner for any questions/problems before then.

## 2018-10-10 ENCOUNTER — Other Ambulatory Visit: Payer: Self-pay

## 2018-10-10 ENCOUNTER — Ambulatory Visit: Payer: Medicare Other | Attending: Family | Admitting: Family

## 2018-10-10 ENCOUNTER — Encounter: Payer: Self-pay | Admitting: Family

## 2018-10-10 VITALS — BP 101/72 | HR 96 | Resp 18 | Ht 76.0 in | Wt 170.2 lb

## 2018-10-10 DIAGNOSIS — Z7982 Long term (current) use of aspirin: Secondary | ICD-10-CM | POA: Insufficient documentation

## 2018-10-10 DIAGNOSIS — I2510 Atherosclerotic heart disease of native coronary artery without angina pectoris: Secondary | ICD-10-CM | POA: Diagnosis not present

## 2018-10-10 DIAGNOSIS — E119 Type 2 diabetes mellitus without complications: Secondary | ICD-10-CM | POA: Insufficient documentation

## 2018-10-10 DIAGNOSIS — Z96611 Presence of right artificial shoulder joint: Secondary | ICD-10-CM | POA: Insufficient documentation

## 2018-10-10 DIAGNOSIS — Z8249 Family history of ischemic heart disease and other diseases of the circulatory system: Secondary | ICD-10-CM | POA: Insufficient documentation

## 2018-10-10 DIAGNOSIS — I110 Hypertensive heart disease with heart failure: Secondary | ICD-10-CM | POA: Diagnosis not present

## 2018-10-10 DIAGNOSIS — M109 Gout, unspecified: Secondary | ICD-10-CM | POA: Insufficient documentation

## 2018-10-10 DIAGNOSIS — Z79899 Other long term (current) drug therapy: Secondary | ICD-10-CM | POA: Insufficient documentation

## 2018-10-10 DIAGNOSIS — I5022 Chronic systolic (congestive) heart failure: Secondary | ICD-10-CM | POA: Diagnosis not present

## 2018-10-10 DIAGNOSIS — F17210 Nicotine dependence, cigarettes, uncomplicated: Secondary | ICD-10-CM | POA: Insufficient documentation

## 2018-10-10 DIAGNOSIS — I10 Essential (primary) hypertension: Secondary | ICD-10-CM

## 2018-10-10 DIAGNOSIS — Z885 Allergy status to narcotic agent status: Secondary | ICD-10-CM | POA: Diagnosis not present

## 2018-10-10 DIAGNOSIS — R634 Abnormal weight loss: Secondary | ICD-10-CM

## 2018-10-10 DIAGNOSIS — Z791 Long term (current) use of non-steroidal anti-inflammatories (NSAID): Secondary | ICD-10-CM | POA: Diagnosis not present

## 2018-10-10 DIAGNOSIS — D649 Anemia, unspecified: Secondary | ICD-10-CM | POA: Diagnosis not present

## 2018-10-10 DIAGNOSIS — E785 Hyperlipidemia, unspecified: Secondary | ICD-10-CM | POA: Diagnosis not present

## 2018-10-10 DIAGNOSIS — J45909 Unspecified asthma, uncomplicated: Secondary | ICD-10-CM | POA: Diagnosis not present

## 2018-10-10 DIAGNOSIS — I429 Cardiomyopathy, unspecified: Secondary | ICD-10-CM | POA: Insufficient documentation

## 2018-10-10 DIAGNOSIS — M1990 Unspecified osteoarthritis, unspecified site: Secondary | ICD-10-CM | POA: Diagnosis not present

## 2018-10-10 DIAGNOSIS — Z720 Tobacco use: Secondary | ICD-10-CM

## 2018-10-10 NOTE — Patient Instructions (Signed)
Continue weighing daily and call for an overnight weight gain of > 2 pounds or a weekly weight gain of >5 pounds. 

## 2018-10-27 ENCOUNTER — Other Ambulatory Visit: Payer: Self-pay | Admitting: Internal Medicine

## 2018-10-30 ENCOUNTER — Telehealth: Payer: Self-pay | Admitting: Internal Medicine

## 2018-11-20 NOTE — Telephone Encounter (Signed)
Patient scheduled cpx on 12/23/18.  Patient said he has enough medication until his appointment.

## 2018-11-24 ENCOUNTER — Other Ambulatory Visit: Payer: Self-pay | Admitting: Internal Medicine

## 2018-12-23 ENCOUNTER — Ambulatory Visit: Payer: Medicare Other | Admitting: Internal Medicine

## 2018-12-30 ENCOUNTER — Other Ambulatory Visit: Payer: Self-pay

## 2018-12-30 ENCOUNTER — Encounter: Payer: Self-pay | Admitting: Internal Medicine

## 2018-12-30 ENCOUNTER — Ambulatory Visit (INDEPENDENT_AMBULATORY_CARE_PROVIDER_SITE_OTHER): Payer: Medicare Other | Admitting: Internal Medicine

## 2018-12-30 VITALS — BP 104/74 | HR 88 | Temp 98.4°F | Ht 73.5 in | Wt 175.0 lb

## 2018-12-30 DIAGNOSIS — I5022 Chronic systolic (congestive) heart failure: Secondary | ICD-10-CM

## 2018-12-30 DIAGNOSIS — Z125 Encounter for screening for malignant neoplasm of prostate: Secondary | ICD-10-CM

## 2018-12-30 DIAGNOSIS — J4520 Mild intermittent asthma, uncomplicated: Secondary | ICD-10-CM

## 2018-12-30 DIAGNOSIS — M8949 Other hypertrophic osteoarthropathy, multiple sites: Secondary | ICD-10-CM

## 2018-12-30 DIAGNOSIS — E119 Type 2 diabetes mellitus without complications: Secondary | ICD-10-CM | POA: Diagnosis not present

## 2018-12-30 DIAGNOSIS — I10 Essential (primary) hypertension: Secondary | ICD-10-CM

## 2018-12-30 DIAGNOSIS — R21 Rash and other nonspecific skin eruption: Secondary | ICD-10-CM

## 2018-12-30 DIAGNOSIS — E782 Mixed hyperlipidemia: Secondary | ICD-10-CM | POA: Diagnosis not present

## 2018-12-30 DIAGNOSIS — M159 Polyosteoarthritis, unspecified: Secondary | ICD-10-CM

## 2018-12-30 DIAGNOSIS — I428 Other cardiomyopathies: Secondary | ICD-10-CM | POA: Diagnosis not present

## 2018-12-30 DIAGNOSIS — J439 Emphysema, unspecified: Secondary | ICD-10-CM | POA: Diagnosis not present

## 2018-12-30 DIAGNOSIS — Z0000 Encounter for general adult medical examination without abnormal findings: Secondary | ICD-10-CM

## 2018-12-30 DIAGNOSIS — M1000 Idiopathic gout, unspecified site: Secondary | ICD-10-CM | POA: Diagnosis not present

## 2018-12-30 LAB — COMPREHENSIVE METABOLIC PANEL
ALT: 40 U/L (ref 0–53)
AST: 74 U/L — ABNORMAL HIGH (ref 0–37)
Albumin: 4.1 g/dL (ref 3.5–5.2)
Alkaline Phosphatase: 52 U/L (ref 39–117)
BUN: 14 mg/dL (ref 6–23)
CO2: 29 mEq/L (ref 19–32)
Calcium: 10 mg/dL (ref 8.4–10.5)
Chloride: 100 mEq/L (ref 96–112)
Creatinine, Ser: 1.72 mg/dL — ABNORMAL HIGH (ref 0.40–1.50)
GFR: 48.84 mL/min — ABNORMAL LOW (ref >60.00)
Glucose, Bld: 135 mg/dL — ABNORMAL HIGH (ref 70–99)
Potassium: 4 mEq/L (ref 3.5–5.1)
Sodium: 138 mEq/L (ref 135–145)
Total Bilirubin: 0.6 mg/dL (ref 0.2–1.2)
Total Protein: 6.6 g/dL (ref 6.0–8.3)

## 2018-12-30 LAB — CBC
HCT: 34.4 % — ABNORMAL LOW (ref 39.0–52.0)
Hemoglobin: 11.6 g/dL — ABNORMAL LOW (ref 13.0–17.0)
MCHC: 33.8 g/dL (ref 30.0–36.0)
MCV: 100.5 fl — ABNORMAL HIGH (ref 78.0–100.0)
Platelets: 107 10*3/uL — ABNORMAL LOW (ref 150.0–400.0)
RBC: 3.43 Mil/uL — ABNORMAL LOW (ref 4.22–5.81)
RDW: 15.3 % (ref 11.5–15.5)
WBC: 4.2 10*3/uL (ref 4.0–10.5)

## 2018-12-30 LAB — PSA, MEDICARE: PSA: 1.02 ng/ml (ref 0.10–4.00)

## 2018-12-30 LAB — URIC ACID: Uric Acid, Serum: 5.3 mg/dL (ref 4.0–7.8)

## 2018-12-30 LAB — LIPID PANEL
Cholesterol: 160 mg/dL (ref 0–200)
HDL: 45.3 mg/dL (ref >39.00)
NonHDL: 114.22
Total CHOL/HDL Ratio: 4
Triglycerides: 243 mg/dL — ABNORMAL HIGH (ref 0.0–149.0)
VLDL: 48.6 mg/dL — ABNORMAL HIGH (ref 0.0–40.0)

## 2018-12-30 LAB — HEMOGLOBIN A1C: Hgb A1c MFr Bld: 6.1 % (ref 4.6–6.5)

## 2018-12-30 LAB — LDL CHOLESTEROL, DIRECT: Direct LDL: 69 mg/dL

## 2018-12-30 NOTE — Assessment & Plan Note (Addendum)
BP low, intermittent lightheadedness Will reach out to Dr. Fletcher Anon to see if he agrees with medication adjustment Continue Entresto, Carvedilol, ASA and Spironolactone Reinforced DASH diet

## 2018-12-30 NOTE — Assessment & Plan Note (Signed)
Encouraged regular stretching and physical activity Continue Tylenol prn

## 2018-12-30 NOTE — Patient Instructions (Signed)

## 2018-12-30 NOTE — Assessment & Plan Note (Signed)
A1C today No microalbumin secondary to ARB therapy Encouraged him to consume a low carb diet No medications at this time Foot exam today Encouraged yearly eye exam  Flu and pneumovax UTD

## 2018-12-30 NOTE — Assessment & Plan Note (Signed)
CMET and lipid profile today Encouraged her to consume a low fat diet Continue Atorvastatin, Fenofibrate and Fish Oil

## 2018-12-30 NOTE — Assessment & Plan Note (Signed)
BP low, intermittent lightheadedness Will reach out to Dr. Fletcher Anon to see if he agrees with medication adjustment Continue Entresto, Carvedilol, ASA and Spironolactone

## 2018-12-30 NOTE — Progress Notes (Signed)
HPI:  Pt presents to the clinic today for his subsequent annual Medicare Wellness Exam. He is also duet to follow up chronic conditions.  HTN with Cardiomyopathy: Managed on Entresto, Carvedilol, ASA and Spironolactone. ECG from 05/2017 and echo from 07/2016 reviewed. He follows with Dr. Fletcher Anon.  COPD: Mild, intermittent. He denies chronic cough or shortness of breath. He uses Albuterol rarely. There are no PFT's on file.   CHF: He denies lower extremity edema or shortness of breath. Managed on Entresto, Carvedilol, ASA and Spironolactone. Echo from 07/2016 reviewed. He follows with Dr. Fletcher Anon.  DM 2: His last A1C was 5.9, 08/2017. He is not taking any oral diabetic medication at this time. He does not check his sugars. He does not routinely check his feet. His last eye exam was 3 months ago  Gout: He denies recent gout flare. He is taking Allopurinol as prescribed.  HLD with CAD: He denies chest pain. His last LDL was 38, 12/2017. He is taking Fish Oil, Atorvastatin and Fenofibrate as prescribed. He does not consume a low fat diet.  OA: Mainly in his shoulder, back and knees. He takes Tylenol as needed with some relief.   Past Medical History:  Diagnosis Date  . Alcohol abuse   . 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 Allergy   . Asthma   . 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 Chronic anemia   . Chronic systolic CHF (congestive heart failure) ([place])    a. EF 20-25% by echo 12/28/10; b improved to 45-50% on echo in June 2015  . 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 Clotting disorder ([place])   . Coronary artery disease    Cardiac catheterization in November 2016 showed mild 30% proximal LAD stenosis with no evidence of obstructive disease.  . Diabetes mellitus, type 2 ([place])    pt reports his DM is gone  . GI bleed    15 years ago  . Gout   . Hemoptysis    secondary to pulmonary edema  . Hyperlipidemia   .  Hypertension   . Nonischemic cardiomyopathy ([place])    minimal coronary disease by cath 12/29/10  . Osteoarthritis   . Pneumonia   . Tobacco abuse   . Vision loss    peripherial vision only left eye.[place]  artery occusion     Current Outpatient Medications  Medication Sig Dispense Refill  . albuterol (PROVENTIL HFA;VENTOLIN HFA) 108 (90 BASE) MCG/ACT inhaler Inhale 2 puffs into the lungs every 6 (six) hours as needed for wheezing or shortness of breath. Take 2 puffs every 5-10 minutes up to 6 puffs total over 15 minutes when needed.  Use with a spacer. (Patient taking differently: Inhale 2 puffs into the lungs every 6 (six) hours as needed for wheezing or shortness of breath. Take 2 puffs up to 6 puffs total over 15 minutes when needed.  Use with a spacer.) 1 Inhaler 2  . allopurinol (ZYLOPRIM) 100 MG tablet Take 2 tablets (200 mg total) by mouth daily. MUST SCHEDULE PHYSICAL 180 tablet 0  . aspirin EC 325 MG tablet Take 162.5 mg by mouth daily.    [name] atorvastatin (LIPITOR) 20 MG tablet Take 1 tablet (20 mg total) by mouth daily. 90 tablet 3  . carvedilol (COREG) 12.5 MG tablet Take 1 tablet (12.5 mg total) by mouth 2 (two) times daily with a meal. 180 tablet 3  . cetirizine (ZYRTEC) 10 MG tablet Take 1  tablet (10 mg total) by mouth daily. 30 tablet 11  . fenofibrate (TRICOR) 145 MG tablet TAKE 1 TABLET BY MOUTH ONCE DAILY 30 tablet 1  . fluconazole (DIFLUCAN) 100 MG tablet Take 1 tablet by mouth 2 (two) times a week.    . fluticasone (FLONASE) 50 MCG/ACT nasal spray Place 1 spray into both nostrils 2 (two) times daily. (Patient taking differently: Place 1 spray into both nostrils 2 (two) times daily as needed for allergies. ) 16 g 0  . folic acid (FOLVITE) 1 MG tablet Take 1 tablet (1 mg total) by mouth daily. 30 tablet 3  . Multiple Vitamin (MULTIVITAMIN WITH MINERALS) TABS tablet Take 1 tablet by mouth daily. Men's One a Day    . naproxen (NAPROSYN) 500 MG tablet Take 500 mg by mouth  2 (two) times daily as needed for moderate pain (pain).     . Omega-3 Fatty Acids (FISH OIL PO) Take 1 capsule by mouth daily.    . sacubitril-valsartan (ENTRESTO) 97-103 MG Take 1 tablet by mouth 2 (two) times daily. 180 tablet 3  . spironolactone (ALDACTONE) 25 MG tablet TAKE 1/2 TABLET BY MOUTH DAILY 15 tablet 6  . triamcinolone ointment (KENALOG) 0.1 % Apply 1 application topically daily.     No current facility-administered medications for this visit.     Allergies  Allergen Reactions  . Tramadol Itching and Rash    Family History  Problem Relation Age of Onset  . Diabetes Mother   . Hypertension Mother   . Diabetes Father   . Hypertension Father   . Diabetes Sister   . Dementia Brother   . Cancer Neg Hx   . Heart disease Neg Hx   . Stroke Neg Hx   . Colon cancer Neg Hx   . Esophageal cancer Neg Hx   . Rectal cancer Neg Hx   . Stomach cancer Neg Hx     Social History   Socioeconomic History  . Marital status: Single    Spouse name: Not on file  . Number of children: 0  . Years of education: Not on file  . Highest education level: High school graduate  Occupational History  . Occupation: retired  Scientific laboratory technician  . Financial resource strain: Not hard at all  . Food insecurity    Worry: Sometimes true    Inability: Never true  . Transportation needs    Medical: No    Non-medical: No  Tobacco Use  . Smoking status: Current Every Day Smoker    Packs/day: 0.50    Years: 30.00    Pack years: 15.00    Types: Cigarettes  . Smokeless tobacco: Never Used  Substance and Sexual Activity  . Alcohol use: Yes    Alcohol/week: 1.0 standard drinks    Types: 1 Glasses of wine per week    Comment: occasionally  . Drug use: Yes    Types: Marijuana    Comment: 1 blunt 1-2 times a month- 08/29/15/17- "last week" 04/17/17-2 weeks ago  . Sexual activity: Yes    Birth control/protection: Condom  Lifestyle  . Physical activity    Days per week: 0 days    Minutes per session:  0 min  . Stress: Only a little  Relationships  . Social Herbalist on phone: Three times a week    Gets together: Once a week    Attends religious service: More than 4 times per year    Active member of club or  organization: Yes    Attends meetings of clubs or organizations: More than 4 times per year    Relationship status: Never married  . Intimate partner violence    Fear of current or ex partner: Patient refused    Emotionally abused: Patient refused    Physically abused: Patient refused    Forced sexual activity: Patient refused  Other Topics Concern  . Not on file  Social History Narrative  . Not on file    Hospitiliaztions: None  Health Maintenance:    Flu: 02/2018  Tetanus: 10/2016  Pneumovax: 07/2017  Shingrix: never  PSA: 07/2017  Colon Screening: 08/2016  Eye Doctor: annually  Dental Exam: as needed   Providers:   PCP: [name], NP-C  Cardiologist: Dr. Fletcher Anon  Dermatology: Dr. Nehemiah Massed   I have personally reviewed and have noted:  1. The patient's medical and social history 2. Their use of alcohol, tobacco or illicit drugs 3. Their current medications and supplements 4. The patient's functional ability including ADL's, fall risks, home safety risks and hearing or visual impairment. 5. Diet and physical activities 6. Evidence for depression or mood disorder  Subjective:   Review of Systems:   Constitutional: Denies fever, malaise, fatigue, headache or abrupt weight changes.  HEENT: Denies eye pain, eye redness, ear pain, ringing in the ears, wax buildup, runny nose, nasal congestion, bloody nose, or sore throat. Respiratory: Denies difficulty breathing, shortness of breath, cough or sputum production.   Cardiovascular: Denies chest pain, chest tightness, palpitations or swelling in the hands or feet.  Gastrointestinal: Denies abdominal pain, bloating, constipation, diarrhea or blood in the stool.  GU: Denies urgency, frequency, pain with  urination, burning sensation, blood in urine, odor or discharge. Musculoskeletal: Pt reports chronic joint pani. Denies decrease in range of motion, difficulty with gait, muscle pain or joint swelling.  Skin: Pt reports rash all over body. Denies ulcercations.  Neurological: Denies dizziness, difficulty with memory, difficulty with speech or problems with balance and coordination.  Psych: Denies anxiety, depression, SI/HI.  No other specific complaints in a complete review of systems (except as listed in HPI above).  Objective:  PE:   BP 104/74   Pulse 88   Temp 98.4 F (36.9 C) (Temporal)   Ht 6' 1.5" (1.867 m)   Wt 175 lb (79.4 kg)   SpO2 97%   BMI 22.78 kg/m   Wt Readings from Last 3 Encounters:  10/10/18 170 lb 4 oz (77.2 kg)  08/28/18 180 lb (81.6 kg)  05/28/18 187 lb 4 oz (84.9 kg)    General: Appears his stated age, well developed, well nourished in NAD. Skin: Warm, dry and intact. Hyperpigmented grouped lesions on bilateral lower legs, bilateral upper legs and back. Excoriation noted.  HEENT: Head: normal shape and size; Eyes: sclera white, no icterus, conjunctiva pink, PERRLA and EOMs intact; Ears: Tm's gray and intact, normal light reflex;  Neck: Neck supple, trachea midline. No masses, lumps or thyromegaly present.  Cardiovascular: Normal rate and rhythm. S1,S2 noted.  No murmur, rubs or gallops noted. No JVD or BLE edema. No carotid bruits noted. Pulmonary/Chest: Normal effort and positive vesicular breath sounds. No respiratory distress. No wheezes, rales or ronchi noted.  Abdomen: Soft and nontender. Normal bowel sounds. No distention or masses noted. Liver, spleen and kidneys non palpable. Musculoskeletal: Strength 5/5 BUE/BLE. No signs of joint swelling.  Neurological: Alert and oriented. Cranial nerves II-XII grossly intact. Coordination normal.  Psychiatric: Mood and affect normal. Behavior is normal. Judgment  and thought content normal.      BMET     Component Value Date/Time   NA 141 04/28/2018 1009   NA 141 06/18/2017 1425   NA 141 04/20/2014 1019   K 4.1 04/28/2018 1009   K 3.7 04/20/2014 1019   CL 106 04/28/2018 1009   CL 106 04/20/2014 1019   CO2 27 04/28/2018 1009   CO2 30 04/20/2014 1019   GLUCOSE 144 (H) 04/28/2018 1009   GLUCOSE 102 (H) 04/20/2014 1019   BUN 16 04/28/2018 1009   BUN 16 06/18/2017 1425   BUN 14 04/20/2014 1019   CREATININE 1.10 04/28/2018 1009   CREATININE 1.18 04/20/2014 1019   CALCIUM 9.5 04/28/2018 1009   CALCIUM 9.3 04/20/2014 1019   GFRNONAA 82 06/18/2017 1425   GFRNONAA >60 04/20/2014 1019   GFRNONAA >60 11/19/2012 1107   GFRAA 95 06/18/2017 1425   GFRAA >60 04/20/2014 1019   GFRAA >60 11/19/2012 1107    Lipid Panel     Component Value Date/Time   CHOL 223 (H) 04/28/2018 1009   CHOL 138 06/18/2017 1425   TRIG 273.0 (H) 04/28/2018 1009   HDL 27.20 (L) 04/28/2018 1009   HDL 67 06/18/2017 1425   CHOLHDL 8 04/28/2018 1009   VLDL 54.6 (H) 04/28/2018 1009   LDLCALC 51 06/18/2017 1425    CBC    Component Value Date/Time   WBC 3.3 (L) 07/30/2017 1219   RBC 3.60 (L) 07/30/2017 1219   HGB 12.2 (L) 07/30/2017 1219   HGB 13.5 04/20/2014 1019   HCT 36.3 (L) 07/30/2017 1219   HCT 41.8 04/20/2014 1019   PLT 191.0 07/30/2017 1219   PLT 348 04/20/2014 1019   MCV 100.9 (H) 07/30/2017 1219   MCV 90 04/20/2014 1019   MCH 31.1 11/11/2016 1202   MCHC 33.7 07/30/2017 1219   RDW 15.6 (H) 07/30/2017 1219   RDW 16.2 (H) 04/20/2014 1019   LYMPHSABS 1.9 09/06/2015 1632   LYMPHSABS 2.1 04/20/2014 1019   MONOABS 0.4 09/06/2015 1632   MONOABS 0.5 04/20/2014 1019   EOSABS 1.3 (H) 09/06/2015 1632   EOSABS 0.3 04/20/2014 1019   BASOSABS 0.0 09/06/2015 1632   BASOSABS 0.1 04/20/2014 1019   BASOSABS 0 06/13/2012 1515    Hgb A1C Lab Results  Component Value Date   HGBA1C 5.9 07/30/2017      Assessment and Plan:   Medicare Annual Wellness Visit:  Diet: He does eat meat. He consumes fruits  and veggies daily. He does eat some fried foods. He drinks mostly water, juice and soda. Physical activity: Sedentary Depression/mood screen: Negative, PHQ 9 score of 3 Hearing: Intact to whispered voice Visual acuity: Grossly normal, performs annual eye exam  ADLs: Capable Fall risk: None Home safety: Good Cognitive evaluation: Intact to orientation, naming, recall and repetition EOL planning: No adv directives, full code/ I agree  Preventative Medicine: Flu shot today. Tetanus UTD. Pneumovax UTD. Colon screening UTD. Encouraged him to consume a balanced diet and exercise regimen. Advised her to see an eye doctor and dentist annually. Will check CBC, CMET, Lipid, A1C and PSA today. Due dates for screening exams given to patient as part of his AVS.  Rash:  Referral placed to derm for second opinion   Next appointment:  6 months, follow up chronic conditions.   [name], NP

## 2018-12-30 NOTE — Assessment & Plan Note (Signed)
Compensated Continue Entresto, Carvedilol, ASA and Spironolactone Continue to follow with cardiology.

## 2018-12-30 NOTE — Assessment & Plan Note (Signed)
Continue Albuterol prn He declines daily inhaler for management Encouraged smoking cessation

## 2018-12-30 NOTE — Addendum Note (Signed)
Addended by: [name] on: 12/30/2018 04:20 PM   Modules accepted: Orders

## 2018-12-30 NOTE — Assessment & Plan Note (Signed)
Uric acid level today Continue Allpurinol

## 2019-01-15 DIAGNOSIS — L308 Other specified dermatitis: Secondary | ICD-10-CM | POA: Diagnosis not present

## 2019-01-23 ENCOUNTER — Other Ambulatory Visit: Payer: Self-pay | Admitting: Internal Medicine

## 2019-02-21 ENCOUNTER — Other Ambulatory Visit: Payer: Self-pay | Admitting: Internal Medicine

## 2019-04-14 ENCOUNTER — Ambulatory Visit: Payer: Medicare Other | Admitting: Family

## 2019-04-15 NOTE — Progress Notes (Signed)
Patient ID: Travis [name], male    DOB: [birth year], 64 y.o.   MRN: [id number]  HPI  Travis [name] is [name] 64 y/o male with [name] history of osteoarthritis, hyperlipidemia, gout, HTN, GI bleed, DM, CAD, anemia, asthma, previous alcohol use, current tobacco use and chronic heart failure.  Reviewed last echo done 08/08/16 which showed an EF of 35-40%. This is an improvement from his last echo which was done 01/26/15 and showed an EF of 25-30% along with trivial Travis/ TR. EF had dropped from 2015 when it was 45-50%.   Had [name] cardiac catheterization done 01/28/15 which showed 10% stenosis in Prox RCA to Mid RCA and 30% stenosis in Prox LAD to Mid LAD which is not much different from previous angiography. Cardiomyopathy felt to be due to previous alcohol use.   Has not been admitted or been in the ED in the last 6 months.  He presents today for [name] follow-up visit with [name] chief complaint of minimal fatigue upon moderate exertion. He describes this as chronic in nature having been present for several years. He has associated intermittent dizziness and gradual weight gain along with this. He denies any difficulty sleeping, abdominal distention, palpitations, pedal edema, chest pain, shortness of breath or cough.   Renal function results from October 2020 showed [name] decline in GFR. He says that he hasn't taken his medications yet today because he was running late.   Past Medical History:  Diagnosis Date  . Alcohol abuse   . 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 Allergy   . Asthma   . 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 Chronic anemia   . Chronic systolic CHF (congestive heart failure) ([place])    [name]. EF 20-25% by echo 12/28/10; b improved to 45-50% on echo in June 2015  . 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 Clotting disorder ([place])   . Coronary artery disease    Cardiac catheterization in November 2016 showed mild 30% proximal LAD stenosis with no  evidence of obstructive disease.  . Diabetes mellitus, type 2 ([place])    pt reports his DM is gone  . GI bleed    15 years ago  . Gout   . Hemoptysis    secondary to pulmonary edema  . Hyperlipidemia   . Hypertension   . Nonischemic cardiomyopathy ([place])    minimal coronary disease by cath 12/29/10  . Osteoarthritis   . Pneumonia   . Tobacco abuse   . Vision loss    peripherial vision only left eye.[facility]  artery occusion    Past Surgical History:  Procedure Laterality Date  . CARDIAC CATHETERIZATION    . CARDIAC CATHETERIZATION N/[name] 01/28/2015   Procedure: Left Heart Cath and Coronary Angiography;  Surgeon: Travis Hampshire, MD;  Location: [facility];  Service: Cardiovascular;  Laterality: N/[name];  . COLONOSCOPY    . HIP ARTHROPLASTY Right 03/15/2013   Procedure: ARTHROPLASTY BIPOLAR HIP;  Surgeon: [name], MD;  Location: [facility];  Service: Orthopedics;  Laterality: Right;  . TOTAL SHOULDER ARTHROPLASTY Right 09/01/2015   Procedure: RIGHT TOTAL SHOULDER ARTHROPLASTY;  Surgeon: [name], MD;  Location: [facility];  Service: Orthopedics;  Laterality: Right;   Family History  Problem Relation Age of Onset  . Diabetes Mother   . Hypertension Mother   . Diabetes Father   . Hypertension Father   . Diabetes Sister   . Dementia Brother   .  Cancer Neg Hx   . Heart disease Neg Hx   . Stroke Neg Hx   . Colon cancer Neg Hx   . Esophageal cancer Neg Hx   . Rectal cancer Neg Hx   . Stomach cancer Neg Hx    Social History   Tobacco Use  . Smoking status: Current Every Day Smoker    Packs/day: 0.50    Years: 30.00    Pack years: 15.00    Types: Cigarettes  . Smokeless tobacco: Never Used  Substance Use Topics  . Alcohol use: Yes    Alcohol/week: 1.0 standard drinks    Types: 1 Glasses of wine per week    Comment: occasionally   Allergies  Allergen Reactions  . Tramadol Itching and Rash   Past Medical History:  Diagnosis Date  . Alcohol abuse   .  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 Allergy   . Asthma   . 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 Chronic anemia   . Chronic systolic CHF (congestive heart failure) ([place])    [name]. EF 20-25% by echo 12/28/10; b improved to 45-50% on echo in June 2015  . 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 Clotting disorder ([place])   . Coronary artery disease    Cardiac catheterization in November 2016 showed mild 30% proximal LAD stenosis with no evidence of obstructive disease.  . Diabetes mellitus, type 2 ([place])    pt reports his DM is gone  . GI bleed    15 years ago  . Gout   . Hemoptysis    secondary to pulmonary edema  . Hyperlipidemia   . Hypertension   . Nonischemic cardiomyopathy ([place])    minimal coronary disease by cath 12/29/10  . Osteoarthritis   . Pneumonia   . Tobacco abuse   . Vision loss    peripherial vision only left eye.[facility]  artery occusion    Past Surgical History:  Procedure Laterality Date  . CARDIAC CATHETERIZATION    . CARDIAC CATHETERIZATION N/[name] 01/28/2015   Procedure: Left Heart Cath and Coronary Angiography;  Surgeon: Travis Hampshire, MD;  Location: [facility];  Service: Cardiovascular;  Laterality: N/[name];  . COLONOSCOPY    . HIP ARTHROPLASTY Right 03/15/2013   Procedure: ARTHROPLASTY BIPOLAR HIP;  Surgeon: [name], MD;  Location: [facility];  Service: Orthopedics;  Laterality: Right;  . TOTAL SHOULDER ARTHROPLASTY Right 09/01/2015   Procedure: RIGHT TOTAL SHOULDER ARTHROPLASTY;  Surgeon: [name], MD;  Location: [facility];  Service: Orthopedics;  Laterality: Right;   Family History  Problem Relation Age of Onset  . Diabetes Mother   . Hypertension Mother   . Diabetes Father   . Hypertension Father   . Diabetes Sister   . Dementia Brother   . Cancer Neg Hx   . Heart disease Neg Hx   . Stroke Neg Hx   . Colon cancer Neg Hx   . Esophageal cancer Neg Hx   . Rectal  cancer Neg Hx   . Stomach cancer Neg Hx    Social History   Tobacco Use  . Smoking status: Current Every Day Smoker    Packs/day: 0.50    Years: 30.00    Pack years: 15.00    Types: Cigarettes  . Smokeless tobacco: Never Used  Substance Use Topics  . Alcohol use: Yes    Alcohol/week: 1.0 standard drinks    Types: 1 Glasses  of wine per week    Comment: occasionally   Allergies  Allergen Reactions  . Tramadol Itching and Rash   Prior to Admission medications   Medication Sig Start Date End Date Taking? Authorizing Provider  albuterol (PROVENTIL HFA;VENTOLIN HFA) 108 (90 BASE) MCG/ACT inhaler Inhale 2 puffs into the lungs every 6 (six) hours as needed for wheezing or shortness of breath. Take 2 puffs every 5-10 minutes up to 6 puffs total over 15 minutes when needed.  Use with [name] spacer. Patient taking differently: Inhale 2 puffs into the lungs every 6 (six) hours as needed for wheezing or shortness of breath. Take 2 puffs up to 6 puffs total over 15 minutes when needed.  Use with [name] spacer. 01/24/15  Yes Travis Prima, MD  allopurinol (ZYLOPRIM) 100 MG tablet Take 2 tablets (200 mg total) by mouth daily. 01/23/19  Yes Travis Fenton, Travis [name]  aspirin EC 325 MG tablet Take 162.5 mg by mouth daily.   Yes [provider]  atorvastatin (LIPITOR) 20 MG tablet Take 1 tablet (20 mg total) by mouth daily. 06/19/17 04/16/19 Yes Travis Hampshire, MD  carvedilol (COREG) 12.5 MG tablet Take 1 tablet (12.5 mg total) by mouth 2 (two) times daily with [name] meal. 07/14/18  Yes Travis Price [name], Travis [name]  cetirizine (ZYRTEC) 10 MG tablet Take 1 tablet (10 mg total) by mouth daily. 06/07/16  Yes Travis Price [name], Travis [name]  fenofibrate (TRICOR) 145 MG tablet TAKE 1 TABLET BY MOUTH ONCE [name] DAY 02/22/19  Yes Travis [name], Travis Keens, Travis [name]  fluticasone (FLONASE) 50 MCG/ACT nasal spray Place 1 spray into both nostrils 2 (two) times daily. Patient taking differently: Place 1 spray into both nostrils 2 (two) times daily as needed  for allergies.  03/29/16  Yes Travis [name], Travis Bills, Travis [name]  folic acid (FOLVITE) 1 MG tablet Take 1 tablet (1 mg total) by mouth daily. 09/20/16  Yes Travis Graff, Travis [name]  Multiple Vitamin (MULTIVITAMIN WITH MINERALS) TABS tablet Take 1 tablet by mouth daily. Men's One [name] Day   Yes [provider]  naproxen (NAPROSYN) 500 MG tablet Take 500 mg by mouth 2 (two) times daily as needed for moderate pain (pain).    Yes [provider]  Omega-3 Fatty Acids (FISH OIL PO) Take 1 capsule by mouth daily.   Yes [provider]  sacubitril-valsartan (ENTRESTO) 97-103 MG Take 1 tablet by mouth 2 (two) times daily. 04/16/18  Yes Travis Price [name], Travis [name]  spironolactone (ALDACTONE) 25 MG tablet TAKE 1/2 TABLET BY MOUTH DAILY 08/26/18  Yes Travis Price [name], Travis [name]  triamcinolone ointment (KENALOG) 0.1 % Apply 1 application topically daily. 04/03/18  Yes [provider]     Review of Systems  Constitutional: Positive for fatigue. Negative for appetite change.  HENT: Negative for congestion, postnasal drip and sore throat.   Eyes: Negative.   Respiratory: Negative for cough, chest tightness and shortness of breath.   Cardiovascular: Negative for chest pain, palpitations and leg swelling.  Gastrointestinal: Negative for abdominal distention and abdominal pain.  Endocrine: Negative.   Genitourinary: Negative.   Musculoskeletal: Negative for back pain and neck pain.  Skin: Negative.   Allergic/Immunologic: Negative.   Neurological: Positive for dizziness (when stand too quickly). Negative for light-headedness and headaches.  Hematological: Negative for adenopathy. Does not bruise/bleed easily.  Psychiatric/Behavioral: Negative for dysphoric mood, sleep disturbance (sleeping on 1 pillow) and suicidal ideas. The patient is not nervous/anxious.    Vitals:   04/16/19 1008  BP: [name])  146/93  Pulse: 94  Resp: 18  SpO2: 99%  Weight: 183 lb 6.4 oz (83.2 kg)  Height: 6\' 4"  (1.93 m)   Wt  Readings from Last 3 Encounters:  04/16/19 183 lb 6.4 oz (83.2 kg)  12/30/18 175 lb (79.4 kg)  10/10/18 170 lb 4 oz (77.2 kg)   Lab Results  Component Value Date   CREATININE 1.72 (H) 12/30/2018   CREATININE 1.10 04/28/2018   CREATININE 1.24 03/05/2018    Physical Exam  Constitutional: He is oriented to person, place, and time. He appears well-developed and well-nourished.  HENT:  Head: Normocephalic and atraumatic.  Neck: No JVD present.  Cardiovascular: Normal rate and regular rhythm.  Pulmonary/Chest: Effort normal. He has no wheezes. He has no rales.  Abdominal: Soft. He exhibits no distension. There is no abdominal tenderness.  Musculoskeletal:        General: No tenderness or edema.     Cervical back: Normal range of motion and neck supple.  Neurological: He is alert and oriented to person, place, and time.  Skin: Skin is warm and dry.  Psychiatric: He has [name] normal mood and affect. His behavior is normal. Thought content normal.  Nursing note and vitals reviewed.    Assessment & Plan:  1: Chronic heart failure with reduced ejection fraction- - NYHA class II - euvolemic today - weighing daily and call for an overnight weight gain of >2 pounds/weekly weight gain of >5 pounds.  - weight up 13 pounds from last visit here 6 months ago; he says that due to [place], he's been staying inside and just "eating more" and admits that he doesn't pick healthy foods to snack on - last echo was done May 2018 so this was scheduled for 04/28/19 - not adding any salt to his food - had telemedicine visit with cardiologist Travis [name]) 08/28/2018 & returns to him 04/30/19 - reports receiving his flu vaccine for this season  2: HTN- - BP mildly elevated but he says that he didn't take his medication this morning because he was running late - BMP from 12/30/2018 reviewed and shows sodium 138, potassium 4.0,creatinine 1.72 and GFR 48.84 - will recheck BMP today to see if his renal function has  improved - saw his PCP Travis [name]) 12/30/2018  3: Tobacco use- - continues to smoke about 1 ppd of cigarettes  - reports never quitting before and doesn't desire to quit at this time - complete cessation discussed for 3 minutes with him   Patient did not bring his medications nor [name] list. Each medication was verbally reviewed with the patient and he was encouraged to bring the bottles to every visit to confirm accuracy of list.  Return in 6 months or sooner for any questions/problems before then.

## 2019-04-16 ENCOUNTER — Encounter: Payer: Self-pay | Admitting: Family

## 2019-04-16 ENCOUNTER — Ambulatory Visit: Payer: Medicare Other | Attending: Family | Admitting: Family

## 2019-04-16 ENCOUNTER — Other Ambulatory Visit: Payer: Self-pay

## 2019-04-16 VITALS — BP 146/93 | HR 94 | Resp 18 | Ht 76.0 in | Wt 183.4 lb

## 2019-04-16 DIAGNOSIS — M1990 Unspecified osteoarthritis, unspecified site: Secondary | ICD-10-CM | POA: Diagnosis not present

## 2019-04-16 DIAGNOSIS — Z96641 Presence of right artificial hip joint: Secondary | ICD-10-CM | POA: Insufficient documentation

## 2019-04-16 DIAGNOSIS — D539 Nutritional anemia, unspecified: Secondary | ICD-10-CM | POA: Diagnosis not present

## 2019-04-16 DIAGNOSIS — I429 Cardiomyopathy, unspecified: Secondary | ICD-10-CM | POA: Diagnosis not present

## 2019-04-16 DIAGNOSIS — I5022 Chronic systolic (congestive) heart failure: Secondary | ICD-10-CM | POA: Insufficient documentation

## 2019-04-16 DIAGNOSIS — I10 Essential (primary) hypertension: Secondary | ICD-10-CM

## 2019-04-16 DIAGNOSIS — Z96611 Presence of right artificial shoulder joint: Secondary | ICD-10-CM | POA: Insufficient documentation

## 2019-04-16 DIAGNOSIS — I110 Hypertensive heart disease with heart failure: Secondary | ICD-10-CM | POA: Insufficient documentation

## 2019-04-16 DIAGNOSIS — Z79899 Other long term (current) drug therapy: Secondary | ICD-10-CM | POA: Insufficient documentation

## 2019-04-16 DIAGNOSIS — E785 Hyperlipidemia, unspecified: Secondary | ICD-10-CM | POA: Diagnosis not present

## 2019-04-16 DIAGNOSIS — E119 Type 2 diabetes mellitus without complications: Secondary | ICD-10-CM | POA: Insufficient documentation

## 2019-04-16 DIAGNOSIS — Z885 Allergy status to narcotic agent status: Secondary | ICD-10-CM | POA: Diagnosis not present

## 2019-04-16 DIAGNOSIS — Z7982 Long term (current) use of aspirin: Secondary | ICD-10-CM | POA: Diagnosis not present

## 2019-04-16 DIAGNOSIS — I2510 Atherosclerotic heart disease of native coronary artery without angina pectoris: Secondary | ICD-10-CM | POA: Insufficient documentation

## 2019-04-16 DIAGNOSIS — Z833 Family history of diabetes mellitus: Secondary | ICD-10-CM | POA: Diagnosis not present

## 2019-04-16 DIAGNOSIS — Z720 Tobacco use: Secondary | ICD-10-CM

## 2019-04-16 DIAGNOSIS — F17210 Nicotine dependence, cigarettes, uncomplicated: Secondary | ICD-10-CM | POA: Diagnosis not present

## 2019-04-16 DIAGNOSIS — F1021 Alcohol dependence, in remission: Secondary | ICD-10-CM | POA: Diagnosis not present

## 2019-04-16 DIAGNOSIS — Z8249 Family history of ischemic heart disease and other diseases of the circulatory system: Secondary | ICD-10-CM | POA: Insufficient documentation

## 2019-04-16 DIAGNOSIS — M109 Gout, unspecified: Secondary | ICD-10-CM | POA: Insufficient documentation

## 2019-04-16 DIAGNOSIS — J45909 Unspecified asthma, uncomplicated: Secondary | ICD-10-CM | POA: Insufficient documentation

## 2019-04-16 LAB — BASIC METABOLIC PANEL
Anion gap: 10 (ref 5–15)
BUN: 23 mg/dL (ref 8–23)
CO2: 28 mmol/L (ref 22–32)
Calcium: 9.1 mg/dL (ref 8.9–10.3)
Chloride: 106 mmol/L (ref 98–111)
Creatinine, Ser: 1.28 mg/dL — ABNORMAL HIGH (ref 0.61–1.24)
GFR calc Af Amer: >60 mL/min (ref >60)
GFR calc non Af Amer: 59 mL/min — ABNORMAL LOW (ref >60)
Glucose, Bld: 69 mg/dL — ABNORMAL LOW (ref 70–99)
Potassium: 4.2 mmol/L (ref 3.5–5.1)
Sodium: 144 mmol/L (ref 135–145)

## 2019-04-16 NOTE — Patient Instructions (Signed)
Continue weighing daily and call for an overnight weight gain of > 2 pounds or a weekly weight gain of >5 pounds. 

## 2019-04-28 ENCOUNTER — Ambulatory Visit: Admission: RE | Admit: 2019-04-28 | Payer: Medicare Other | Source: Ambulatory Visit

## 2019-04-30 ENCOUNTER — Ambulatory Visit: Payer: Medicare Other | Admitting: Cardiovascular Disease

## 2019-05-01 ENCOUNTER — Encounter: Payer: Self-pay | Admitting: Cardiovascular Disease

## 2019-05-01 ENCOUNTER — Telehealth: Payer: Self-pay | Admitting: Cardiovascular Disease

## 2019-05-01 NOTE — Telephone Encounter (Signed)
Error

## 2019-05-06 ENCOUNTER — Emergency Department (HOSPITAL_COMMUNITY)
Admission: EM | Admit: 2019-05-06 | Discharge: 2019-05-06 | Disposition: A | Discharge status: Home / Self Care | Payer: Medicare Other | Attending: Emergency Medicine | Admitting: Emergency Medicine

## 2019-05-06 ENCOUNTER — Encounter (HOSPITAL_COMMUNITY): Payer: Self-pay | Admitting: Emergency Medicine

## 2019-05-06 ENCOUNTER — Emergency Department (HOSPITAL_COMMUNITY): Payer: Medicare Other

## 2019-05-06 ENCOUNTER — Other Ambulatory Visit: Payer: Self-pay

## 2019-05-06 DIAGNOSIS — I5022 Chronic systolic (congestive) heart failure: Secondary | ICD-10-CM | POA: Diagnosis not present

## 2019-05-06 DIAGNOSIS — F17210 Nicotine dependence, cigarettes, uncomplicated: Secondary | ICD-10-CM | POA: Diagnosis not present

## 2019-05-06 DIAGNOSIS — Z96611 Presence of right artificial shoulder joint: Secondary | ICD-10-CM | POA: Insufficient documentation

## 2019-05-06 DIAGNOSIS — Z7982 Long term (current) use of aspirin: Secondary | ICD-10-CM | POA: Insufficient documentation

## 2019-05-06 DIAGNOSIS — I2510 Atherosclerotic heart disease of native coronary artery without angina pectoris: Secondary | ICD-10-CM | POA: Diagnosis not present

## 2019-05-06 DIAGNOSIS — R9431 Abnormal electrocardiogram [ECG] [EKG]: Secondary | ICD-10-CM | POA: Diagnosis not present

## 2019-05-06 DIAGNOSIS — E119 Type 2 diabetes mellitus without complications: Secondary | ICD-10-CM | POA: Insufficient documentation

## 2019-05-06 DIAGNOSIS — Z79899 Other long term (current) drug therapy: Secondary | ICD-10-CM | POA: Diagnosis not present

## 2019-05-06 DIAGNOSIS — R1011 Right upper quadrant pain: Secondary | ICD-10-CM | POA: Diagnosis not present

## 2019-05-06 DIAGNOSIS — J449 Chronic obstructive pulmonary disease, unspecified: Secondary | ICD-10-CM | POA: Diagnosis not present

## 2019-05-06 DIAGNOSIS — I110 Hypertensive heart disease with heart failure: Secondary | ICD-10-CM | POA: Insufficient documentation

## 2019-05-06 DIAGNOSIS — Z96641 Presence of right artificial hip joint: Secondary | ICD-10-CM | POA: Insufficient documentation

## 2019-05-06 DIAGNOSIS — K7689 Other specified diseases of liver: Secondary | ICD-10-CM | POA: Diagnosis not present

## 2019-05-06 DIAGNOSIS — K760 Fatty (change of) liver, not elsewhere classified: Secondary | ICD-10-CM | POA: Diagnosis not present

## 2019-05-06 DIAGNOSIS — R748 Abnormal levels of other serum enzymes: Secondary | ICD-10-CM | POA: Diagnosis not present

## 2019-05-06 DIAGNOSIS — R079 Chest pain, unspecified: Secondary | ICD-10-CM | POA: Diagnosis not present

## 2019-05-06 DIAGNOSIS — J45909 Unspecified asthma, uncomplicated: Secondary | ICD-10-CM | POA: Diagnosis not present

## 2019-05-06 DIAGNOSIS — R945 Abnormal results of liver function studies: Secondary | ICD-10-CM | POA: Diagnosis not present

## 2019-05-06 LAB — CBC
HCT: 35.4 % — ABNORMAL LOW (ref 39.0–52.0)
Hemoglobin: 11.5 g/dL — ABNORMAL LOW (ref 13.0–17.0)
MCH: 34 pg (ref 26.0–34.0)
MCHC: 32.5 g/dL (ref 30.0–36.0)
MCV: 104.7 fL — ABNORMAL HIGH (ref 80.0–100.0)
Platelets: 122 10*3/uL — ABNORMAL LOW (ref 150–400)
RBC: 3.38 MIL/uL — ABNORMAL LOW (ref 4.22–5.81)
RDW: 17.4 % — ABNORMAL HIGH (ref 11.5–15.5)
WBC: 2.6 10*3/uL — ABNORMAL LOW (ref 4.0–10.5)
nRBC: 0.8 % — ABNORMAL HIGH (ref 0.0–0.2)

## 2019-05-06 LAB — BASIC METABOLIC PANEL
Anion gap: 10 (ref 5–15)
BUN: 12 mg/dL (ref 8–23)
CO2: 28 mmol/L (ref 22–32)
Calcium: 9.9 mg/dL (ref 8.9–10.3)
Chloride: 104 mmol/L (ref 98–111)
Creatinine, Ser: 1.56 mg/dL — ABNORMAL HIGH (ref 0.61–1.24)
GFR calc Af Amer: 54 mL/min — ABNORMAL LOW (ref >60)
GFR calc non Af Amer: 47 mL/min — ABNORMAL LOW (ref >60)
Glucose, Bld: 141 mg/dL — ABNORMAL HIGH (ref 70–99)
Potassium: 4.1 mmol/L (ref 3.5–5.1)
Sodium: 142 mmol/L (ref 135–145)

## 2019-05-06 LAB — HEPATITIS PANEL, ACUTE
HCV Ab: NONREACTIVE
Hep A IgM: NONREACTIVE
Hep B C IgM: NONREACTIVE
Hepatitis B Surface Ag: NONREACTIVE

## 2019-05-06 LAB — HEPATIC FUNCTION PANEL
ALT: 237 U/L — ABNORMAL HIGH (ref 0–44)
AST: 499 U/L — ABNORMAL HIGH (ref 15–41)
Albumin: 3.5 g/dL (ref 3.5–5.0)
Alkaline Phosphatase: 56 U/L (ref 38–126)
Bilirubin, Direct: 0.2 mg/dL (ref 0.0–0.2)
Indirect Bilirubin: 0.7 mg/dL (ref 0.3–0.9)
Total Bilirubin: 0.9 mg/dL (ref 0.3–1.2)
Total Protein: 6.4 g/dL — ABNORMAL LOW (ref 6.5–8.1)

## 2019-05-06 LAB — LIPASE, BLOOD: Lipase: 46 U/L (ref 11–51)

## 2019-05-06 LAB — TROPONIN I (HIGH SENSITIVITY)
Troponin I (High Sensitivity): 7 ng/L (ref <18)
Troponin I (High Sensitivity): 7 ng/L (ref <18)

## 2019-05-06 MED ORDER — FAMOTIDINE IN NACL 20-0.9 MG/50ML-% IV SOLN
20.0000 mg | Freq: Once | INTRAVENOUS | Status: AC
  Administered 2019-05-06: 20 mg via INTRAVENOUS
  Filled 2019-05-06: qty 50

## 2019-05-06 MED ORDER — SODIUM CHLORIDE 0.9% FLUSH: 3.0000 mL | Freq: Once | INTRAVENOUS | Status: DC

## 2019-05-06 MED ORDER — MORPHINE SULFATE (PF) 4 MG/ML IV SOLN
4.0000 mg | Freq: Once | INTRAVENOUS | Status: AC
  Administered 2019-05-06: 20:00:00 4 mg via INTRAVENOUS
  Filled 2019-05-06: qty 1

## 2019-05-06 MED ORDER — ONDANSETRON HCL 4 MG/2ML IJ SOLN
4.0000 mg | Freq: Once | INTRAMUSCULAR | Status: AC
  Administered 2019-05-06: 17:00:00 4 mg via INTRAVENOUS
  Filled 2019-05-06: qty 2

## 2019-05-06 MED ORDER — SODIUM CHLORIDE 0.9 % IV BOLUS
1000.0000 mL | Freq: Once | INTRAVENOUS | Status: AC
  Administered 2019-05-06: 17:00:00 1000 mL via INTRAVENOUS

## 2019-05-06 NOTE — ED Notes (Signed)
Pt transported to [facility]

## 2019-05-06 NOTE — Discharge Instructions (Addendum)
You will need to call your gastroenterologist office tomorrow to schedule an appointment for follow-up.  Please return to the ER sooner if you have any new or worsening symptoms, or if you have any of the following symptoms:  Abdominal pain that does not go away.  You have a fever.  You keep throwing up (vomiting).  The pain is felt only in portions of the abdomen. Pain in the right side could possibly be appendicitis. In an adult, pain in the left lower portion of the abdomen could be colitis or diverticulitis.  You pass bloody or black tarry stools.  There is bright red blood in the stool.  The constipation stays for more than 4 days.  There is belly (abdominal) or rectal pain.  You do not seem to be getting better.  You have any questions or concerns.

## 2019-05-06 NOTE — ED Notes (Signed)
Pt verbalized discharge instructions. Follow up care reviewed. Pt had no further questions at this time.

## 2019-05-06 NOTE — ED Notes (Signed)
Pt given water for fluid challenge, denies nausea but reports 6/10 abd pain in the RUQ. Provider aware

## 2019-05-06 NOTE — ED Provider Notes (Signed)
[facility] EMERGENCY DEPARTMENT Provider Note   CSN: [id number] Arrival date & time: 05/06/19  1434     History Chief Complaint  Patient presents with  . Abdominal Pain    Travis [name] is a 64 y.o. male.  HPI   64 year old male with a history of alcohol abuse, alcoholic cardiomyopathy, CHF, clotting disorder, CAD, diabetes, GI bleed, hyperlipidemia, hypertension, nonischemic cardiomyopathy, osteoarthritis, who presents the emergency department today for evaluation of right upper quadrant pain.  He states that he has had intermittent right upper quadrant pain for the last several days.  Pain is intermittent and feels sharp in nature.  When he has the pain it is severe.  He does not have the pain currently.  He denies any specific exacerbating or alleviating factors.  It is not specifically exacerbated by eating.  He denies any associated nausea, vomiting, hematemesis, diarrhea, constipation, bloody stools, melena.  Denies any chest pain or shortness of breath.  He notes that he drank a lot of alcohol this weekend when watching the Super Bowl.  He said he had several shots of hard liquor.  Following this night was when the pain seemed to start.  Past Medical History:  Diagnosis Date  . Alcohol abuse   . Alcoholic cardiomyopathy ([place]) 03/15/2013  . Allergy   . Asthma   . 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 Chronic anemia   . Chronic systolic CHF (congestive heart failure) ([place])    a. EF 20-25% by echo 12/28/10; b improved to 45-50% on echo in June 2015  . 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 Clotting disorder ([place])   . Coronary artery disease    Cardiac catheterization in November 2016 showed mild 30% proximal LAD stenosis with no evidence of obstructive disease.  . Diabetes mellitus, type 2 ([place])    pt reports his DM is gone  . GI bleed    15 years ago  . Gout   . Hemoptysis    secondary to  pulmonary edema  . Hyperlipidemia   . Hypertension   . Nonischemic cardiomyopathy ([place])    minimal coronary disease by cath 12/29/10  . Osteoarthritis   . Pneumonia   . Tobacco abuse   . Vision loss    peripherial vision only left eye.[place]  artery occusion     Patient Active Problem List   Diagnosis Date Noted  . Osteoarthritis 05/09/2016  . COPD (chronic obstructive pulmonary disease) ([place]) 05/09/2016  . Essential hypertension 04/18/2015  . Literacy level of illiterate 06/04/2014  . 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 Cardiomyopathy, nonischemic ([place]) 03/15/2013  . Chronic systolic heart failure ([place]) 01/05/2011  . Tobacco use 01/05/2011  . Diabetes ([place]) 11/30/2008  . HLD (hyperlipidemia) 11/30/2008  . Gout 11/30/2008    Past Surgical History:  Procedure Laterality Date  . CARDIAC CATHETERIZATION    . CARDIAC CATHETERIZATION N/A 01/28/2015   Procedure: Left Heart Cath and Coronary Angiography;  Surgeon: Wellington Hampshire, MD;  Location: [facility];  Service: Cardiovascular;  Laterality: N/A;  . COLONOSCOPY    . HIP ARTHROPLASTY Right 03/15/2013   Procedure: ARTHROPLASTY BIPOLAR HIP;  Surgeon: [name], MD;  Location: [facility];  Service: Orthopedics;  Laterality: Right;  . TOTAL SHOULDER ARTHROPLASTY Right 09/01/2015   Procedure: RIGHT TOTAL SHOULDER ARTHROPLASTY;  Surgeon: [name], MD;  Location: [facility];  Service: Orthopedics;  Laterality: Right;  Family History  Problem Relation Age of Onset  . Diabetes Mother   . Hypertension Mother   . Diabetes Father   . Hypertension Father   . Diabetes Sister   . Dementia Brother   . Cancer Neg Hx   . Heart disease Neg Hx   . Stroke Neg Hx   . Colon cancer Neg Hx   . Esophageal cancer Neg Hx   . Rectal cancer Neg Hx   . Stomach cancer Neg Hx     Social History   Tobacco Use  . Smoking status: Current Every Day Smoker    Packs/day: 0.50    Years: 30.00    Pack  years: 15.00    Types: Cigarettes  . Smokeless tobacco: Never Used  Substance Use Topics  . Alcohol use: Yes    Alcohol/week: 1.0 standard drinks    Types: 1 Glasses of wine per week    Comment: occasionally  . Drug use: Yes    Types: Marijuana    Comment: 1 blunt 1-2 times a month- 08/29/15/17- "last week" 04/17/17-2 weeks ago    Home Medications Prior to Admission medications   Medication Sig Start Date End Date Taking? Authorizing Provider  albuterol (PROVENTIL HFA;VENTOLIN HFA) 108 (90 BASE) MCG/ACT inhaler Inhale 2 puffs into the lungs every 6 (six) hours as needed for wheezing or shortness of breath. Take 2 puffs every 5-10 minutes up to 6 puffs total over 15 minutes when needed.  Use with a spacer. Patient taking differently: Inhale 2 puffs into the lungs every 6 (six) hours as needed for wheezing or shortness of breath. Take 2 puffs up to 6 puffs total over 15 minutes when needed.  Use with a spacer. 01/24/15   Ahmed Prima, MD  allopurinol (ZYLOPRIM) 100 MG tablet Take 2 tablets (200 mg total) by mouth daily. 01/23/19   [name], NP  aspirin EC 325 MG tablet Take 162.5 mg by mouth daily.    [provider]  atorvastatin (LIPITOR) 20 MG tablet Take 1 tablet (20 mg total) by mouth daily. 06/19/17 04/16/19  Wellington Hampshire, MD  carvedilol (COREG) 12.5 MG tablet Take 1 tablet (12.5 mg total) by mouth 2 (two) times daily with a meal. 07/14/18   Alisa Graff, FNP  cetirizine (ZYRTEC) 10 MG tablet Take 1 tablet (10 mg total) by mouth daily. 06/07/16   Alisa Graff, FNP  fenofibrate (TRICOR) 145 MG tablet TAKE 1 TABLET BY MOUTH ONCE A DAY 02/22/19   [name], NP  fluticasone (FLONASE) 50 MCG/ACT nasal spray Place 1 spray into both nostrils 2 (two) times daily. Patient taking differently: Place 1 spray into both nostrils 2 (two) times daily as needed for allergies.  03/29/16   Cuthriell, Charline Bills, PA-C  folic acid (FOLVITE) 1 MG tablet Take 1 tablet (1 mg  total) by mouth daily. 09/20/16   Alisa Graff, FNP  Multiple Vitamin (MULTIVITAMIN WITH MINERALS) TABS tablet Take 1 tablet by mouth daily. Men's One a Day    [provider]  naproxen (NAPROSYN) 500 MG tablet Take 500 mg by mouth 2 (two) times daily as needed for moderate pain (pain).     [provider]  Omega-3 Fatty Acids (FISH OIL PO) Take 1 capsule by mouth daily.    [provider]  sacubitril-valsartan (ENTRESTO) 97-103 MG Take 1 tablet by mouth 2 (two) times daily. 04/16/18   Alisa Graff, FNP  spironolactone (ALDACTONE) 25 MG tablet TAKE  1/2 TABLET BY MOUTH DAILY 08/26/18   Darylene Price A, FNP  triamcinolone ointment (KENALOG) 0.1 % Apply 1 application topically daily. 04/03/18   [provider]    Allergies    Patient has no known allergies.  Review of Systems   Review of Systems  Constitutional: Negative for fever.  HENT: Negative for ear pain and sore throat.   Eyes: Negative for visual disturbance.  Respiratory: Negative for cough and shortness of breath.   Cardiovascular: Negative for chest pain.  Gastrointestinal: Positive for abdominal pain. Negative for blood in stool, constipation, diarrhea, nausea and vomiting.  Genitourinary: Negative for dysuria and hematuria.  Musculoskeletal: Negative for back pain.  Skin: Negative for color change and rash.  Neurological: Negative for headaches.  All other systems reviewed and are negative.   Physical Exam Updated Vital Signs BP 132/84   Pulse 78   Temp 97.8 F (36.6 C) (Oral)   Resp 10   SpO2 100%   Physical Exam Vitals and nursing note reviewed.  Constitutional:      Appearance: He is well-developed. He is not ill-appearing.  HENT:     Head: Normocephalic and atraumatic.  Eyes:     Conjunctiva/sclera: Conjunctivae normal.  Cardiovascular:     Rate and Rhythm: Normal rate and regular rhythm.     Pulses: Normal pulses.     Heart sounds: Normal heart sounds. No murmur.    Pulmonary:     Effort: Pulmonary effort is normal. No respiratory distress.     Breath sounds: Normal breath sounds. No wheezing, rhonchi or rales.  Abdominal:     General: Bowel sounds are normal.     Palpations: Abdomen is soft.     Tenderness: There is abdominal tenderness (mild RUQ TTP). There is no guarding or rebound.  Musculoskeletal:     Cervical back: Neck supple.  Skin:    General: Skin is warm and dry.  Neurological:     Mental Status: He is alert.     ED Results / Procedures / Treatments   Labs (all labs ordered are listed, but only abnormal results are displayed) Labs Reviewed  BASIC METABOLIC PANEL - Abnormal; Notable for the following components:      Result Value   Glucose, Bld 141 (*)    Creatinine, Ser 1.56 (*)    GFR calc non Af Amer 47 (*)    GFR calc Af Amer 54 (*)    All other components within normal limits  CBC - Abnormal; Notable for the following components:   WBC 2.6 (*)    RBC 3.38 (*)    Hemoglobin 11.5 (*)    HCT 35.4 (*)    MCV 104.7 (*)    RDW 17.4 (*)    Platelets 122 (*)    nRBC 0.8 (*)    All other components within normal limits  HEPATIC FUNCTION PANEL - Abnormal; Notable for the following components:   Total Protein 6.4 (*)    AST 499 (*)    ALT 237 (*)    All other components within normal limits  LIPASE, BLOOD  HEPATITIS PANEL, ACUTE  ANTINUCLEAR ANTIBODIES, IFA  TROPONIN I (HIGH SENSITIVITY)  TROPONIN I (HIGH SENSITIVITY)    EKG None  Radiology DG Chest 2 View  Result Date: 05/06/2019 CLINICAL DATA:  Intermittent chest pain since yesterday, lightheadedness EXAM: CHEST - 2 VIEW COMPARISON:  05/12/2015 FINDINGS: Frontal and lateral views of the chest demonstrate a stable cardiac silhouette. No airspace disease, effusion, or pneumothorax.  No acute bony abnormalities. IMPRESSION: No acute cardiopulmonary disease. Electronically Signed   By: Randa Ngo M.D.   On: 05/06/2019 15:10   US Abdomen Limited RUQ  Result Date:  05/06/2019 CLINICAL DATA:  Right upper quadrant pain since yesterday. EXAM: ULTRASOUND ABDOMEN LIMITED RIGHT UPPER QUADRANT COMPARISON:  CT abdomen pelvis 04/26/2014 FINDINGS: Gallbladder: The gallbladder is contracted which limits evaluation. The gallbladder wall is borderline thickened measuring 0.4 cm. There is a hyperechoic non mobile mural focus measuring 0.8 cm which does not shadow. No sonographic Murphy sign noted by sonographer. Common bile duct: Diameter: 0.6 cm, within normal limits. Liver: No focal lesion identified. The parenchymal echogenicity is diffusely increased. Portal vein is patent on color Doppler imaging with normal direction of blood flow towards the liver. Other: None. IMPRESSION: 1. The gallbladder is contracted which limits evaluation. Borderline wall thickening which may be secondary to contracted state. There is a non mobile mural focus measuring 0.8 cm, possibly a polyp. Yearly ultrasound follow-up is recommended. 2. Diffusely increased liver parenchymal echogenicity most commonly seen with hepatic steatosis. Electronically Signed   By: Audie Pinto M.D.   On: 05/06/2019 17:27    Procedures Procedures (including critical care time)  Medications Ordered in ED Medications  sodium chloride flush (NS) 0.9 % injection 3 mL (3 mLs Intravenous Not Given 05/06/19 1613)  famotidine (PEPCID) IVPB 20 mg premix (0 mg Intravenous Stopped 05/06/19 1755)  ondansetron (ZOFRAN) injection 4 mg (4 mg Intravenous Given 05/06/19 1651)  sodium chloride 0.9 % bolus 1,000 mL (0 mLs Intravenous Stopped 05/06/19 1917)  morphine 4 MG/ML injection 4 mg (4 mg Intravenous Given 05/06/19 1948)    ED Course  I have reviewed the triage vital signs and the nursing notes.  Pertinent labs & imaging results that were available during my care of the patient were reviewed by me and considered in my medical decision making (see chart for details).    MDM Rules/Calculators/A&P                       64 year old male presenting with right upper quadrant/epigastric abdominal pain that started after binge drinking a few days ago for the Super Bowl.  Vital signs reassuring.  Exam with some right upper quadrant tenderness without rebound, rigidity or guarding.  Reviewed labs CBC with leukocytosis which patient appears to have had in the past.  Also with thrombocytopenia that is improved from prior likely secondary to chronic alcohol use.  Anemia also present but stable on review of prior labs. BMP with mildly elevated creatinine at 1.56, this does appear to be not far off of his baseline.  Electrolytes are nonacute Troponin negative x2 Lipase negative Hepatic function panel with elevations in AST/ALT at 499 and 237.  Bilirubin is normal.  Right upper quadrant ultrasound which showed contracted gallbladder, borderline thickening which could be secondary to contracted state. There is a non mobile mural focus measuring 0.8 cm, possibly a polyp. Yearly ultrasound follow-up is recommended. Diffusely increased liver parenchymal echogenicity most commonly seen with hepatic steatosis  6:32 PM CONSULT with Dr. Ardis Hughs with gastroenterology who recommends adding an acute viral hepatitis panel, and ANA.  Advised that the patient stop drinking and call the office tomorrow to schedule an appointment for follow-up.  He states that patient is tolerating p.o. and is not in any significant pain that he is likely safe for discharge home with close follow-up.  Patient was given a dose of pain medicine, antacids, nausea medication.  On reassessment he has been able to tolerate p.o. and he no longer is complaining of any abdominal pain.  Discussed the results and plan for follow-up.  He voices understanding of plan and reasons to return.  All questions answered.  Patient stable for discharge.   Final Clinical Impression(s) / ED Diagnoses Final diagnoses:  RUQ pain  Elevated liver enzymes    Rx / DC Orders ED  Discharge Orders    None       Bishop Dublin 05/06/19 2034    Charlesetta Shanks, MD 05/08/19 1552

## 2019-05-06 NOTE — ED Triage Notes (Signed)
Pt here from home with c/p epigastric pain for 2 days off and on , no n/v or chest pain , pt is an every day drinker of etoh

## 2019-05-07 ENCOUNTER — Telehealth: Payer: Self-pay | Admitting: Gastroenterology

## 2019-05-07 DIAGNOSIS — R791 Abnormal coagulation profile: Secondary | ICD-10-CM

## 2019-05-07 DIAGNOSIS — R7989 Other specified abnormal findings of blood chemistry: Secondary | ICD-10-CM

## 2019-05-07 LAB — ANTINUCLEAR ANTIBODIES, IFA: ANA Ab, IFA: NEGATIVE

## 2019-05-07 NOTE — Telephone Encounter (Signed)
Linna Hoff, thanks. I agree with your plan, will await his labs and follow up. Thanks

## 2019-05-07 NOTE — Telephone Encounter (Signed)
Travis [name], I was called by ER last night. He presented with some intermittent RUQ pains.  AST 500, ALT 237, Tbili and alk phos normal.  [place] normal.    He drinks a lot and especially recently at a super bowl party, pain started the next day.  He was fine to d/c home but probably needs soon follow up.  They sent an acute viral panel and it was all negative, ANA pending.     Patty, Can you contact him today, arrange repeat labs (cmet, inr) for tomorrow, advise no etoh. Follow up with Dr. Havery Moros or APP in 2-3 weeks. Can triage sooner pending the repeat LFTs if needed.    Thanks  DJ

## 2019-05-07 NOTE — Telephone Encounter (Signed)
Patient returned your call and I advised him of previous message patient understood instructions to do labs prior to his appt on 06/04/19.

## 2019-05-07 NOTE — Telephone Encounter (Signed)
Left message on machine to call back Dr Havery Moros appt on 06/04/19 at 9 am.  Lab order in [place]

## 2019-05-07 NOTE — Telephone Encounter (Signed)
The pt has been advised and will come in for labs and follow up appt.

## 2019-05-26 ENCOUNTER — Other Ambulatory Visit: Payer: Self-pay | Admitting: Internal Medicine

## 2019-05-28 ENCOUNTER — Other Ambulatory Visit: Payer: Medicare Other

## 2019-06-04 ENCOUNTER — Ambulatory Visit (INDEPENDENT_AMBULATORY_CARE_PROVIDER_SITE_OTHER): Payer: Medicare Other

## 2019-06-04 ENCOUNTER — Ambulatory Visit: Payer: Medicare Other | Admitting: Gastroenterology

## 2019-06-04 ENCOUNTER — Other Ambulatory Visit: Payer: Self-pay

## 2019-06-04 DIAGNOSIS — I5022 Chronic systolic (congestive) heart failure: Secondary | ICD-10-CM | POA: Diagnosis not present

## 2019-06-10 ENCOUNTER — Encounter: Payer: Self-pay | Admitting: Nurse Practitioner

## 2019-06-10 ENCOUNTER — Ambulatory Visit (INDEPENDENT_AMBULATORY_CARE_PROVIDER_SITE_OTHER): Payer: Medicare Other | Admitting: Nurse Practitioner

## 2019-06-10 ENCOUNTER — Other Ambulatory Visit: Payer: Self-pay

## 2019-06-10 VITALS — BP 112/78 | HR 85 | Ht 76.0 in | Wt 185.4 lb

## 2019-06-10 DIAGNOSIS — I5022 Chronic systolic (congestive) heart failure: Secondary | ICD-10-CM | POA: Diagnosis not present

## 2019-06-10 DIAGNOSIS — Z7289 Other problems related to lifestyle: Secondary | ICD-10-CM

## 2019-06-10 DIAGNOSIS — Z789 Other specified health status: Secondary | ICD-10-CM

## 2019-06-10 DIAGNOSIS — Z720 Tobacco use: Secondary | ICD-10-CM

## 2019-06-10 DIAGNOSIS — I10 Essential (primary) hypertension: Secondary | ICD-10-CM | POA: Diagnosis not present

## 2019-06-10 DIAGNOSIS — F1090 Alcohol use, unspecified, uncomplicated: Secondary | ICD-10-CM

## 2019-06-10 DIAGNOSIS — E782 Mixed hyperlipidemia: Secondary | ICD-10-CM

## 2019-06-10 DIAGNOSIS — I428 Other cardiomyopathies: Secondary | ICD-10-CM

## 2019-06-10 NOTE — Progress Notes (Signed)
Office Visit    Patient Name: Travis [name] Date of Encounter: 06/10/2019  Primary Care Provider:  [name], NP Primary Cardiologist:  Kathlyn Sacramento, MD  Chief Complaint    64 year old male with a history of alcoholic/nonischemic cardiomyopathy, HFrEF (EF 35-40% March 2021), nonobstructive coronary artery disease, TIA, tobacco abuse, hyperlipidemia, and previous excessive alcohol abuse, who presents for follow-up related to heart failure.  Past Medical History    Past Medical History:  Diagnosis Date  . Alcohol abuse   . Alcoholic cardiomyopathy ([place]) 03/15/2013   a. 05/2019 Echo: EF 35-40%.  . Allergy   . Asthma   . 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 Chronic anemia   . Clotting disorder ([place])   . Diabetes mellitus, type 2 ([place])    pt reports his DM is gone  . GI bleed    15 years ago  . Gout   . Hemoptysis    secondary to pulmonary edema  . HFrEF (heart failure with reduced ejection fraction) ([place])    a. 12/2010 Echo: EF 20-25%; b 08/2013 Echo: EF 45-50%; c. 01/2015 Echo: EF 25-30%; d. 07/2016 Echo: EF 35-40%; e. 05/2019 Echo: EF 35-40%, glob HK. Mild LVH. Gr1 DD. Nl RV fxn.  . Hyperlipidemia   . Hypertension   . Nonischemic cardiomyopathy ([place])   . Nonobstructive Coronary artery disease    a. 01/2015 Cath: LM nl, LAD 30p/m, LCX nl, RCA 10p/m, RPDA min irregs.  . Osteoarthritis   . Pneumonia   . TIA (transient ischemic attack)   . Tobacco abuse   . Vision loss    peripherial vision only left eye.[facility]  artery occusion    Past Surgical History:  Procedure Laterality Date  . CARDIAC CATHETERIZATION    . CARDIAC CATHETERIZATION N/A 01/28/2015   Procedure: Left Heart Cath and Coronary Angiography;  Surgeon: Wellington Hampshire, MD;  Location: [facility];  Service: Cardiovascular;  Laterality: N/A;  . COLONOSCOPY    . HIP ARTHROPLASTY Right 03/15/2013   Procedure: ARTHROPLASTY BIPOLAR HIP;  Surgeon: [name],  MD;  Location: [facility];  Service: Orthopedics;  Laterality: Right;  . TOTAL SHOULDER ARTHROPLASTY Right 09/01/2015   Procedure: RIGHT TOTAL SHOULDER ARTHROPLASTY;  Surgeon: [name], MD;  Location: [facility];  Service: Orthopedics;  Laterality: Right;    Allergies  No Known Allergies  History of Present Illness    64 year old male with the above complex past medical history including alcoholic/nonischemic cardiomyopathy with EF previously as low as 20-25% in October 2012.  Other history includes HFrEF, nonobstructive LAD and RCA disease (cath 01/2015), TIA, tobacco abuse, hyperlipidemia, and previous excessive alcohol use.  Most recent echocardiogram was performed earlier this month showing an EF of 35-40% with global hypokinesis and grade 1 diastolic dysfunction.  He has been followed closely in the heart failure clinic and was most recently seen there on January 21, at which time his weight was up 13 pounds over 6 months though, he was euvolemic on examination.  Since his last visit, he has done well.  His weight is up 185 pounds, which he attributes to inactivity and a better appetite at home but he has not noticed any significant dyspnea, edema, PND, orthopnea, or early satiety.  Further, he denies chest pain or palpitations.  He remains compliant with his medications.  He continues to smoke just about a pack a day.  He says that he has limited drinking to just holidays  or bigger celebrations.  He does not think he has had a drink since the Super Bowl.  Home Medications    Prior to Admission medications   Medication Sig Start Date End Date Taking? Authorizing Provider  ENTRESTO 97-103 MG TAKE 1 TABLET BY MOUTH TWICE (2) DAILY 05/27/19   Darylene Price A, FNP  albuterol (PROVENTIL HFA;VENTOLIN HFA) 108 (90 BASE) MCG/ACT inhaler Inhale 2 puffs into the lungs every 6 (six) hours as needed for wheezing or shortness of breath. Take 2 puffs every 5-10 minutes up to 6 puffs total over 15 minutes when  needed.  Use with a spacer. Patient taking differently: Inhale 2 puffs into the lungs every 6 (six) hours as needed for wheezing or shortness of breath. Take 2 puffs up to 6 puffs total over 15 minutes when needed.  Use with a spacer. 01/24/15   Ahmed Prima, MD  allopurinol (ZYLOPRIM) 100 MG tablet Take 2 tablets (200 mg total) by mouth daily. 01/23/19   [name], NP  aspirin EC 325 MG tablet Take 162.5 mg by mouth daily.    [provider]  atorvastatin (LIPITOR) 20 MG tablet Take 1 tablet (20 mg total) by mouth daily. 06/19/17 04/16/19  Wellington Hampshire, MD  carvedilol (COREG) 12.5 MG tablet Take 1 tablet (12.5 mg total) by mouth 2 (two) times daily with a meal. 07/14/18   Alisa Graff, FNP  cetirizine (ZYRTEC) 10 MG tablet Take 1 tablet (10 mg total) by mouth daily. 06/07/16   Alisa Graff, FNP  fenofibrate (TRICOR) 145 MG tablet TAKE 1 TABLET BY MOUTH ONCE A DAY 02/22/19   [name], NP  fluticasone (FLONASE) 50 MCG/ACT nasal spray Place 1 spray into both nostrils 2 (two) times daily. Patient taking differently: Place 1 spray into both nostrils 2 (two) times daily as needed for allergies.  03/29/16   Cuthriell, Charline Bills, PA-C  folic acid (FOLVITE) 1 MG tablet Take 1 tablet (1 mg total) by mouth daily. 09/20/16   Alisa Graff, FNP  Multiple Vitamin (MULTIVITAMIN WITH MINERALS) TABS tablet Take 1 tablet by mouth daily. Men's One a Day    [provider]  naproxen (NAPROSYN) 500 MG tablet Take 500 mg by mouth 2 (two) times daily as needed for moderate pain (pain).     [provider]  Omega-3 Fatty Acids (FISH OIL PO) Take 1 capsule by mouth daily.    [provider]  spironolactone (ALDACTONE) 25 MG tablet TAKE 1/2 TABLET BY MOUTH DAILY 08/26/18   Darylene Price A, FNP  triamcinolone ointment (KENALOG) 0.1 % Apply 1 application topically daily. 04/03/18   [provider]    Review of Systems    He denies chest pain, palpitations,  dyspnea, pnd, orthopnea, n, v, dizziness, syncope, edema, weight gain, or early satiety.  All other systems reviewed and are otherwise negative except as noted above.  Physical Exam    VS:  BP 112/78 (BP Location: Left Arm, Patient Position: Sitting, Cuff Size: Normal)   Pulse 85   Ht 6\' 4"  (1.93 m)   Wt 185 lb 6 oz (84.1 kg)   SpO2 98%   BMI 22.56 kg/m  , BMI Body mass index is 22.56 kg/m. GEN: Well nourished, well developed, in no acute distress. HEENT: normal. Neck: Supple, no JVD, carotid bruits, or masses. Cardiac: RRR, no murmurs, rubs, or gallops. No clubbing, cyanosis, edema.  Radials/PT 2+ and equal bilaterally.  Respiratory:  Respirations regular and unlabored,  clear to auscultation bilaterally. GI: Soft, nontender, nondistended, BS + x 4. MS: no deformity or atrophy. Skin: warm and dry, no rash. Neuro:  Strength and sensation are intact. Psych: Normal affect.  Accessory Clinical Findings    ECG personally reviewed by me today -regular sinus rhythm, 85, left axis deviation, left anterior fascicular block - no acute changes.  Lab Results  Component Value Date   WBC 2.6 (L) 05/06/2019   HGB 11.5 (L) 05/06/2019   HCT 35.4 (L) 05/06/2019   MCV 104.7 (H) 05/06/2019   PLT 122 (L) 05/06/2019   Lab Results  Component Value Date   CREATININE 1.56 (H) 05/06/2019   BUN 12 05/06/2019   NA 142 05/06/2019   K 4.1 05/06/2019   CL 104 05/06/2019   CO2 28 05/06/2019   Lab Results  Component Value Date   ALT 237 (H) 05/06/2019   AST 499 (H) 05/06/2019   ALKPHOS 56 05/06/2019   BILITOT 0.9 05/06/2019   Lab Results  Component Value Date   CHOL 160 12/30/2018   HDL 45.30 12/30/2018   LDLCALC 51 06/18/2017   LDLDIRECT 69.0 12/30/2018   TRIG 243.0 (H) 12/30/2018   CHOLHDL 4 12/30/2018    Lab Results  Component Value Date   HGBA1C 6.1 12/30/2018    Assessment & Plan    1.  Chronic systolic congestive heart failure/alcoholic-nonischemic cardiomyopathy: Recent echo  showing persistent LV dysfunction with an EF of 35-40% and global hypokinesis.  Grade 1 diastolic dysfunction also noted.  Though his weight is up 15 pounds since July 2020, he is euvolemic on examination and well compensated.  He attributes weight gain to reduced activity and increase caloric intake.  He has been compliant with medications and remains on Entresto, carvedilol, and spironolactone therapy.  He now limits alcohol ingestion to holidays or bigger events and thinks that he has not had a drink since the Super Bowl.  I have encouraged him to quit drinking completely.  We discussed the importance of daily weights, sodium restriction, medication compliance, and symptom reporting and he verbalizes understanding.   2.  Essential hypertension: Stable.  3.  Tobacco abuse: He continues to smoke up to about 1 pack/day.  Cessation advised.  He is not currently interested in quitting.  4.  Alcohol use: As above, only drinking on holidays and bigger celebrations.  He says no drinks in about 2+ months.  Complete cessation advised.  5.  Hyperlipidemia/hypertriglyceridemia: LDL of 69 with triglycerides of 243 in October 2020.  He remains on statin and fibrate therapy.  6.  Elevated LFTs: recent ER visit in Feb w/ abd discomfort and elevated LFTs.  He is supposed to f/u w/ GI but missed recent appt.  7.  Disposition:  He has f/u w/ CHF clinic in ~ 4 mos.  F/u here in 6 mos or sooner if necessary.  Murray Hodgkins, NP 06/10/2019, 1:28 PM

## 2019-06-10 NOTE — Patient Instructions (Signed)
Medication Instructions:  - Your physician recommends that you continue on your current medications as directed. Please refer to the Current Medication list given to you today.  *If you need a refill on your cardiac medications before your next appointment, please call your pharmacy*   Lab Work: - none ordered  If you have labs (blood work) drawn today and your tests are completely normal, you will receive your results only by: [name] MyChart Message (if you have MyChart) OR . A paper copy in the mail If you have any lab test that is abnormal or we need to change your treatment, we will call you to review the results.   Testing/Procedures: - none ordered   Follow-Up: At [facility], you and your health needs are our priority.  As part of our continuing mission to provide you with exceptional heart care, we have created designated Provider Care Teams.  These Care Teams include your primary Cardiologist (physician) and Advanced Practice Providers (APPs -  Physician Assistants and Nurse Practitioners) who all work together to provide you with the care you need, when you need it.  We recommend signing up for the patient portal called "MyChart".  Sign up information is provided on this After Visit Summary.  MyChart is used to connect with patients for Virtual Visits (Telemedicine).  Patients are able to view lab/test results, encounter notes, upcoming appointments, etc.  Non-urgent messages can be sent to your provider as well.   To learn more about what you can do with MyChart, go to [email or web address].    Your next appointment:   6 month(s)  The format for your next appointment:   In Person  Provider:    You may see Kathlyn Sacramento, MD or one of the following Advanced Practice Providers on your designated Care Team:    Murray Hodgkins, NP  Christell Faith, PA-C  Marrianne Mood, PA-C    Other Instructions - n/a

## 2019-06-11 DIAGNOSIS — I498 Other specified cardiac arrhythmias: Secondary | ICD-10-CM | POA: Diagnosis not present

## 2019-06-11 DIAGNOSIS — E1340 Other specified diabetes mellitus with diabetic neuropathy, unspecified: Secondary | ICD-10-CM | POA: Diagnosis not present

## 2019-06-11 DIAGNOSIS — R0602 Shortness of breath: Secondary | ICD-10-CM | POA: Diagnosis not present

## 2019-06-11 DIAGNOSIS — Z723 Lack of physical exercise: Secondary | ICD-10-CM | POA: Diagnosis not present

## 2019-06-11 DIAGNOSIS — I429 Cardiomyopathy, unspecified: Secondary | ICD-10-CM | POA: Diagnosis not present

## 2019-06-17 ENCOUNTER — Other Ambulatory Visit: Payer: Self-pay | Admitting: Internal Medicine

## 2019-06-17 NOTE — Telephone Encounter (Signed)
This has never been filled by you... please advise

## 2019-06-18 NOTE — Telephone Encounter (Signed)
Kidney function abnormal. Refill denied. He should try Tylenol 650 3 x day instead. Avoid NSAID's OTC

## 2019-06-25 ENCOUNTER — Encounter: Payer: Self-pay | Admitting: Family Medicine

## 2019-06-25 ENCOUNTER — Ambulatory Visit (INDEPENDENT_AMBULATORY_CARE_PROVIDER_SITE_OTHER): Payer: Medicare Other | Admitting: Family Medicine

## 2019-06-25 ENCOUNTER — Other Ambulatory Visit: Payer: Self-pay

## 2019-06-25 VITALS — BP 106/60 | HR 92 | Temp 96.6°F | Ht 76.0 in | Wt 191.1 lb

## 2019-06-25 DIAGNOSIS — M545 Low back pain, unspecified: Secondary | ICD-10-CM

## 2019-06-25 MED ORDER — BACLOFEN 10 MG PO TABS: 10.0000 mg | ORAL_TABLET | Freq: Three times a day (TID) | ORAL | 0 refills | Status: DC | PRN

## 2019-06-25 MED ORDER — HYDROCODONE-ACETAMINOPHEN 5-325 MG PO TABS: 1.0000 | ORAL_TABLET | Freq: Four times a day (QID) | ORAL | 0 refills | Status: DC | PRN

## 2019-06-25 MED ORDER — PREDNISONE 10 MG PO TABS: ORAL_TABLET | ORAL | 0 refills | Status: DC

## 2019-06-25 NOTE — Patient Instructions (Signed)
Take prednisone with food.  Then use baclofen as needed for muscle spasms.  If needed, use hydrocodone.  Sedation caution on meds.   Update [place] as needed.  You should gradually get better.  Take care.  Glad to see you.

## 2019-06-25 NOTE — Progress Notes (Signed)
This visit occurred during the SARS-CoV-2 public health emergency.  Safety protocols were in place, including screening questions prior to the visit, additional usage of staff PPE, and extensive cleaning of exam room while observing appropriate contact time as indicated for disinfecting solutions.  Lower back pain.  More pain with standing.  B lower back pain.  Worse on the L side.  Started about 3 days ago, worse in the meantime.  No trigger but it did start when he was [place] the car wash.   He has known L spine DJD.  No radicular pain. No abd pain.  He has spasms in lower back. No dysuria.  No black stools.  H/o CHF.  He isn't SOB.  He has tolerated prednisone prev. Not diabetic.     Meds, vitals, and allergies reviewed.   ROS: Per HPI unless specifically indicated in ROS section   nad but uncomfortable Neck supple, no LA rrr ctab Lower back sore B w/o rash.  No bruising.  S/S wnl BLE SLR neg B. Able to bear weight.

## 2019-06-26 DIAGNOSIS — G8929 Other chronic pain: Secondary | ICD-10-CM | POA: Insufficient documentation

## 2019-06-26 DIAGNOSIS — M545 Low back pain, unspecified: Secondary | ICD-10-CM | POA: Insufficient documentation

## 2019-06-26 NOTE — Assessment & Plan Note (Signed)
Imaging unlikely to change plan at this point.  D/w pt about options.  Take prednisone with food.  Routine cautions d/w pt on all meds.  Then use baclofen as needed for muscle spasms.  If needed, use hydrocodone.  Sedation caution on meds.   Update [place] as needed.  He should gradually get better. He agrees with plan.

## 2019-07-03 ENCOUNTER — Other Ambulatory Visit: Payer: Self-pay | Admitting: Family

## 2019-07-03 MED ORDER — SPIRONOLACTONE 25 MG PO TABS: 12.5000 mg | ORAL_TABLET | Freq: Every day | ORAL | 3 refills | Status: DC

## 2019-07-22 ENCOUNTER — Other Ambulatory Visit: Payer: Self-pay | Admitting: Internal Medicine

## 2019-07-26 ENCOUNTER — Encounter (HOSPITAL_COMMUNITY): Payer: Self-pay | Admitting: Emergency Medicine

## 2019-07-26 ENCOUNTER — Emergency Department (HOSPITAL_COMMUNITY): Payer: Medicare Other

## 2019-07-26 ENCOUNTER — Emergency Department (HOSPITAL_COMMUNITY)
Admission: EM | Admit: 2019-07-26 | Discharge: 2019-07-26 | Disposition: A | Discharge status: Home / Self Care | Payer: Medicare Other | Attending: Emergency Medicine | Admitting: Emergency Medicine

## 2019-07-26 ENCOUNTER — Other Ambulatory Visit: Payer: Self-pay

## 2019-07-26 DIAGNOSIS — S0003XA Contusion of scalp, initial encounter: Secondary | ICD-10-CM | POA: Insufficient documentation

## 2019-07-26 DIAGNOSIS — Y92410 Unspecified street and highway as the place of occurrence of the external cause: Secondary | ICD-10-CM | POA: Diagnosis not present

## 2019-07-26 DIAGNOSIS — Y9389 Activity, other specified: Secondary | ICD-10-CM | POA: Insufficient documentation

## 2019-07-26 DIAGNOSIS — F17210 Nicotine dependence, cigarettes, uncomplicated: Secondary | ICD-10-CM | POA: Diagnosis not present

## 2019-07-26 DIAGNOSIS — Z8673 Personal history of transient ischemic attack (TIA), and cerebral infarction without residual deficits: Secondary | ICD-10-CM | POA: Diagnosis not present

## 2019-07-26 DIAGNOSIS — M549 Dorsalgia, unspecified: Secondary | ICD-10-CM | POA: Insufficient documentation

## 2019-07-26 DIAGNOSIS — Z96611 Presence of right artificial shoulder joint: Secondary | ICD-10-CM | POA: Diagnosis not present

## 2019-07-26 DIAGNOSIS — S199XXA Unspecified injury of neck, initial encounter: Secondary | ICD-10-CM | POA: Diagnosis present

## 2019-07-26 DIAGNOSIS — F10920 Alcohol use, unspecified with intoxication, uncomplicated: Secondary | ICD-10-CM | POA: Diagnosis not present

## 2019-07-26 DIAGNOSIS — E119 Type 2 diabetes mellitus without complications: Secondary | ICD-10-CM | POA: Diagnosis not present

## 2019-07-26 DIAGNOSIS — F1210 Cannabis abuse, uncomplicated: Secondary | ICD-10-CM | POA: Diagnosis not present

## 2019-07-26 DIAGNOSIS — Z96641 Presence of right artificial hip joint: Secondary | ICD-10-CM | POA: Insufficient documentation

## 2019-07-26 DIAGNOSIS — Y908 Blood alcohol level of 240 mg/100 ml or more: Secondary | ICD-10-CM | POA: Insufficient documentation

## 2019-07-26 DIAGNOSIS — R7989 Other specified abnormal findings of blood chemistry: Secondary | ICD-10-CM | POA: Diagnosis not present

## 2019-07-26 DIAGNOSIS — I10 Essential (primary) hypertension: Secondary | ICD-10-CM | POA: Diagnosis not present

## 2019-07-26 DIAGNOSIS — Z79899 Other long term (current) drug therapy: Secondary | ICD-10-CM | POA: Insufficient documentation

## 2019-07-26 DIAGNOSIS — Z7982 Long term (current) use of aspirin: Secondary | ICD-10-CM | POA: Insufficient documentation

## 2019-07-26 DIAGNOSIS — Y998 Other external cause status: Secondary | ICD-10-CM | POA: Insufficient documentation

## 2019-07-26 DIAGNOSIS — I5022 Chronic systolic (congestive) heart failure: Secondary | ICD-10-CM | POA: Diagnosis not present

## 2019-07-26 DIAGNOSIS — S139XXA Sprain of joints and ligaments of unspecified parts of neck, initial encounter: Secondary | ICD-10-CM | POA: Diagnosis not present

## 2019-07-26 DIAGNOSIS — N179 Acute kidney failure, unspecified: Secondary | ICD-10-CM | POA: Insufficient documentation

## 2019-07-26 DIAGNOSIS — S3991XA Unspecified injury of abdomen, initial encounter: Secondary | ICD-10-CM | POA: Insufficient documentation

## 2019-07-26 DIAGNOSIS — I110 Hypertensive heart disease with heart failure: Secondary | ICD-10-CM | POA: Insufficient documentation

## 2019-07-26 LAB — CBC
HCT: 39.9 % (ref 39.0–52.0)
Hemoglobin: 12.8 g/dL — ABNORMAL LOW (ref 13.0–17.0)
MCH: 32.9 pg (ref 26.0–34.0)
MCHC: 32.1 g/dL (ref 30.0–36.0)
MCV: 102.6 fL — ABNORMAL HIGH (ref 80.0–100.0)
Platelets: 303 10*3/uL (ref 150–400)
RBC: 3.89 MIL/uL — ABNORMAL LOW (ref 4.22–5.81)
RDW: 15.2 % (ref 11.5–15.5)
WBC: 4.6 10*3/uL (ref 4.0–10.5)
nRBC: 0 % (ref 0.0–0.2)

## 2019-07-26 LAB — COMPREHENSIVE METABOLIC PANEL
ALT: 16 U/L (ref 0–44)
AST: 30 U/L (ref 15–41)
Albumin: 4 g/dL (ref 3.5–5.0)
Alkaline Phosphatase: 41 U/L (ref 38–126)
Anion gap: 16 — ABNORMAL HIGH (ref 5–15)
BUN: 43 mg/dL — ABNORMAL HIGH (ref 8–23)
CO2: 18 mmol/L — ABNORMAL LOW (ref 22–32)
Calcium: 9.3 mg/dL (ref 8.9–10.3)
Chloride: 105 mmol/L (ref 98–111)
Creatinine, Ser: 2.19 mg/dL — ABNORMAL HIGH (ref 0.61–1.24)
GFR calc Af Amer: 36 mL/min — ABNORMAL LOW (ref >60)
GFR calc non Af Amer: 31 mL/min — ABNORMAL LOW (ref >60)
Glucose, Bld: 87 mg/dL (ref 70–99)
Potassium: 4.9 mmol/L (ref 3.5–5.1)
Sodium: 139 mmol/L (ref 135–145)
Total Bilirubin: 0.9 mg/dL (ref 0.3–1.2)
Total Protein: 7.2 g/dL (ref 6.5–8.1)

## 2019-07-26 LAB — I-STAT CHEM 8, ED
BUN: 45 mg/dL — ABNORMAL HIGH (ref 8–23)
Calcium, Ion: 1.1 mmol/L — ABNORMAL LOW (ref 1.15–1.40)
Chloride: 108 mmol/L (ref 98–111)
Creatinine, Ser: 2.4 mg/dL — ABNORMAL HIGH (ref 0.61–1.24)
Glucose, Bld: 83 mg/dL (ref 70–99)
HCT: 40 % (ref 39.0–52.0)
Hemoglobin: 13.6 g/dL (ref 13.0–17.0)
Potassium: 4.8 mmol/L (ref 3.5–5.1)
Sodium: 140 mmol/L (ref 135–145)
TCO2: 20 mmol/L — ABNORMAL LOW (ref 22–32)

## 2019-07-26 LAB — RAPID URINE DRUG SCREEN, HOSP PERFORMED
Amphetamines: NOT DETECTED
Barbiturates: NOT DETECTED
Benzodiazepines: NOT DETECTED
Cocaine: NOT DETECTED
Opiates: NOT DETECTED
Tetrahydrocannabinol: POSITIVE — AB

## 2019-07-26 LAB — URINALYSIS, ROUTINE W REFLEX MICROSCOPIC
Bilirubin Urine: NEGATIVE
Glucose, UA: NEGATIVE mg/dL
Hgb urine dipstick: NEGATIVE
Ketones, ur: NEGATIVE mg/dL
Leukocytes,Ua: NEGATIVE
Nitrite: NEGATIVE
Protein, ur: NEGATIVE mg/dL
Specific Gravity, Urine: 1.013 (ref 1.005–1.030)
pH: 5 (ref 5.0–8.0)

## 2019-07-26 LAB — ETHANOL: Alcohol, Ethyl (B): 285 mg/dL — ABNORMAL HIGH (ref <10)

## 2019-07-26 LAB — PROTIME-INR
INR: 1.1 (ref 0.8–1.2)
Prothrombin Time: 14.1 seconds (ref 11.4–15.2)

## 2019-07-26 LAB — LACTIC ACID, PLASMA: Lactic Acid, Venous: 3.1 mmol/L (ref 0.5–1.9)

## 2019-07-26 MED ORDER — MORPHINE SULFATE (PF) 4 MG/ML IV SOLN
4.0000 mg | Freq: Once | INTRAVENOUS | Status: AC
  Administered 2019-07-26: 4 mg via INTRAVENOUS
  Filled 2019-07-26: qty 1

## 2019-07-26 MED ORDER — ONDANSETRON HCL 4 MG/2ML IJ SOLN
4.0000 mg | Freq: Once | INTRAMUSCULAR | Status: AC
  Administered 2019-07-26: 4 mg via INTRAVENOUS
  Filled 2019-07-26: qty 2

## 2019-07-26 MED ORDER — LIDOCAINE 5 % EX PTCH: 1.0000 | MEDICATED_PATCH | CUTANEOUS | 0 refills | Status: DC

## 2019-07-26 MED ORDER — SODIUM CHLORIDE 0.9 % IV BOLUS
500.0000 mL | Freq: Once | INTRAVENOUS | Status: AC
  Administered 2019-07-26: 500 mL via INTRAVENOUS
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
## 2019-07-26 NOTE — ED Notes (Signed)
Pt reports a headache, 9/10.

## 2019-07-26 NOTE — Discharge Instructions (Signed)
DO not take Ibuprofen of Aleve as you have worsening kidney function. You need to follow up with your doctors for a recheck of this. You may take Tylenol at home for your pain. I have also written you for lidoderm patches.  I would recommend decreased alcohol use  Return for new or worsening symptoms.
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
## 2019-07-26 NOTE — ED Notes (Signed)
Pt to Xray.

## 2019-07-26 NOTE — ED Notes (Signed)
Informed provider of critical lactic

## 2019-07-27 LAB — SAMPLE TO BLOOD BANK

## 2019-07-30 ENCOUNTER — Encounter: Payer: Self-pay | Admitting: Internal Medicine

## 2019-07-30 ENCOUNTER — Ambulatory Visit (INDEPENDENT_AMBULATORY_CARE_PROVIDER_SITE_OTHER): Payer: Medicare Other | Admitting: Internal Medicine

## 2019-07-30 ENCOUNTER — Other Ambulatory Visit: Payer: Self-pay

## 2019-07-30 DIAGNOSIS — N1832 Chronic kidney disease, stage 3b: Secondary | ICD-10-CM | POA: Diagnosis not present

## 2019-07-30 DIAGNOSIS — M25512 Pain in left shoulder: Secondary | ICD-10-CM | POA: Diagnosis not present

## 2019-07-30 DIAGNOSIS — R0789 Other chest pain: Secondary | ICD-10-CM

## 2019-07-30 LAB — BASIC METABOLIC PANEL
BUN: 42 mg/dL — ABNORMAL HIGH (ref 6–23)
CO2: 29 mEq/L (ref 19–32)
Calcium: 10.3 mg/dL (ref 8.4–10.5)
Chloride: 104 mEq/L (ref 96–112)
Creatinine, Ser: 2 mg/dL — ABNORMAL HIGH (ref 0.40–1.50)
GFR: 40.96 mL/min — ABNORMAL LOW (ref >60.00)
Glucose, Bld: 112 mg/dL — ABNORMAL HIGH (ref 70–99)
Potassium: 4.6 mEq/L (ref 3.5–5.1)
Sodium: 137 mEq/L (ref 135–145)

## 2019-07-30 MED ORDER — LIDOCAINE 5 % EX PTCH: 1.0000 | MEDICATED_PATCH | CUTANEOUS | 0 refills | Status: DC

## 2019-07-30 MED ORDER — CYCLOBENZAPRINE HCL 10 MG PO TABS
10.0000 mg | ORAL_TABLET | Freq: Three times a day (TID) | ORAL | 0 refills | Status: DC | PRN
Start: 2019-07-30 — End: 2019-12-07

## 2019-07-30 NOTE — Patient Instructions (Signed)
Chronic Kidney Disease, Adult Chronic kidney disease (CKD) happens when the kidneys are damaged over a long period of time. The kidneys are two organs that help with:  Getting rid of waste and extra fluid from the blood.  Making hormones that maintain the amount of fluid in your tissues and blood vessels.  Making sure that the body has the right amount of fluids and chemicals. Most of the time, CKD does not go away, but it can usually be controlled. Steps must be taken to slow down the kidney damage or to stop it from getting worse. If this is not done, the kidneys may stop working. Follow these instructions at home: Medicines  Take over-the-counter and prescription medicines only as told by your doctor. You may need to change the amount of medicines you take.  Do not take any new medicines unless your doctor says it is okay. Many medicines can make your kidney damage worse.  Do not take any vitamin and supplements unless your doctor says it is okay. Many vitamins and supplements can make your kidney damage worse. General instructions  Follow a diet as told by your doctor. You may need to stay away from: ? Alcohol. ? Salty foods. ? Foods that are high in:  Potassium.  Calcium.  Protein.  Do not use any products that contain nicotine or tobacco, such as cigarettes and e-cigarettes. If you need help quitting, ask your doctor.  Keep track of your blood pressure at home. Tell your doctor about any changes.  If you have diabetes, keep track of your blood sugar as told by your doctor.  Try to stay at a healthy weight. If you need help, ask your doctor.  Exercise at least 30 minutes a day, 5 days a week.  Stay up-to-date with your shots (immunizations) as told by your doctor.  Keep all follow-up visits as told by your doctor. This is important. Contact a doctor if:  Your symptoms get worse.  You have new symptoms. Get help right away if:  You have symptoms of end-stage  kidney disease. These may include: ? Headaches. ? Numbness in your hands or feet. ? Easy bruising. ? Having hiccups often. ? Chest pain. ? Shortness of breath. ? Stopping of menstrual periods in women.  You have a fever.  You have very little pee (urine).  You have pain or bleeding when you pee. Summary  Chronic kidney disease (CKD) happens when the kidneys are damaged over a long period of time.  Most of the time, this condition does not go away, but it can usually be controlled. Steps must be taken to slow down the kidney damage or to stop it from getting worse.  Treatment may include a combination of medicines and lifestyle changes. This information is not intended to replace advice given to you by your health care provider. Make sure you discuss any questions you have with your health care provider. Document Revised: 02/22/2017 Document Reviewed: 04/16/2016 Elsevier Patient Education  2020 Elsevier Inc.  
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
## 2019-07-31 ENCOUNTER — Telehealth: Payer: Self-pay

## 2019-07-31 NOTE — Telephone Encounter (Signed)
PA has been submitted via [email or web address] for [employer]... awaiting response

## 2019-08-11 ENCOUNTER — Other Ambulatory Visit: Payer: Self-pay

## 2019-08-11 ENCOUNTER — Telehealth: Payer: Self-pay

## 2019-08-11 ENCOUNTER — Encounter (HOSPITAL_COMMUNITY): Payer: Self-pay | Admitting: Emergency Medicine

## 2019-08-11 ENCOUNTER — Emergency Department (HOSPITAL_COMMUNITY)
Admission: EM | Admit: 2019-08-11 | Discharge: 2019-08-12 | Disposition: A | Discharge status: Home / Self Care | Payer: Medicare Other | Attending: Emergency Medicine | Admitting: Emergency Medicine

## 2019-08-11 DIAGNOSIS — R55 Syncope and collapse: Secondary | ICD-10-CM | POA: Diagnosis not present

## 2019-08-11 DIAGNOSIS — Z5321 Procedure and treatment not carried out due to patient leaving prior to being seen by health care provider: Secondary | ICD-10-CM | POA: Diagnosis not present

## 2019-08-11 LAB — CBC
HCT: 38.9 % — ABNORMAL LOW (ref 39.0–52.0)
Hemoglobin: 12.9 g/dL — ABNORMAL LOW (ref 13.0–17.0)
MCH: 32.5 pg (ref 26.0–34.0)
MCHC: 33.2 g/dL (ref 30.0–36.0)
MCV: 98 fL (ref 80.0–100.0)
Platelets: DECREASED 10*3/uL (ref 150–400)
RBC: 3.97 MIL/uL — ABNORMAL LOW (ref 4.22–5.81)
RDW: 14.9 % (ref 11.5–15.5)
WBC: 4.3 10*3/uL (ref 4.0–10.5)
nRBC: 0.5 % — ABNORMAL HIGH (ref 0.0–0.2)

## 2019-08-11 LAB — BASIC METABOLIC PANEL
Anion gap: 12 (ref 5–15)
BUN: 36 mg/dL — ABNORMAL HIGH (ref 8–23)
CO2: 25 mmol/L (ref 22–32)
Calcium: 9.5 mg/dL (ref 8.9–10.3)
Chloride: 97 mmol/L — ABNORMAL LOW (ref 98–111)
Creatinine, Ser: 2.32 mg/dL — ABNORMAL HIGH (ref 0.61–1.24)
GFR calc Af Amer: 33 mL/min — ABNORMAL LOW (ref >60)
GFR calc non Af Amer: 29 mL/min — ABNORMAL LOW (ref >60)
Glucose, Bld: 146 mg/dL — ABNORMAL HIGH (ref 70–99)
Potassium: 4.4 mmol/L (ref 3.5–5.1)
Sodium: 134 mmol/L — ABNORMAL LOW (ref 135–145)

## 2019-08-11 NOTE — ED Triage Notes (Signed)
Pt reports a syncopal episode yesterday where he fell on his knees and one today. Endorses back pain and neck pain. States he feels lightheaded when standing, BP 90/61 in triage.
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
## 2019-08-12 NOTE — Telephone Encounter (Signed)
Unable to reach pt by phone. Per ED note pt left without being seen. Left v/m requesting pt to call [place].
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
## 2019-08-12 NOTE — Telephone Encounter (Signed)
Left message on voicemail.

## 2019-08-12 NOTE — Telephone Encounter (Signed)
I don't think he should wait. I would at least go to [facility] for some xrays. I can still see him tomorrow though if he refuses to go.

## 2019-08-13 NOTE — Telephone Encounter (Signed)
Left message on voicemail.

## 2019-08-28 ENCOUNTER — Other Ambulatory Visit: Payer: Self-pay | Admitting: Family

## 2019-08-28 MED ORDER — CARVEDILOL 12.5 MG PO TABS: 12.5000 mg | ORAL_TABLET | Freq: Two times a day (BID) | ORAL | 3 refills | Status: DC

## 2019-09-09 ENCOUNTER — Telehealth: Payer: Self-pay

## 2019-09-09 ENCOUNTER — Other Ambulatory Visit: Payer: Self-pay

## 2019-09-09 ENCOUNTER — Ambulatory Visit
Admission: RE | Admit: 2019-09-09 | Discharge: 2019-09-09 | Disposition: A | Discharge status: Home / Self Care | Payer: Medicare Other | Source: Ambulatory Visit | Attending: Internal Medicine | Admitting: Internal Medicine

## 2019-09-09 ENCOUNTER — Encounter: Payer: Self-pay | Admitting: Internal Medicine

## 2019-09-09 ENCOUNTER — Ambulatory Visit (INDEPENDENT_AMBULATORY_CARE_PROVIDER_SITE_OTHER): Payer: Medicare Other | Admitting: Internal Medicine

## 2019-09-09 ENCOUNTER — Ambulatory Visit (INDEPENDENT_AMBULATORY_CARE_PROVIDER_SITE_OTHER)
Admission: RE | Admit: 2019-09-09 | Discharge: 2019-09-09 | Disposition: A | Discharge status: Home / Self Care | Payer: Medicare Other | Source: Ambulatory Visit | Attending: Internal Medicine | Admitting: Internal Medicine

## 2019-09-09 VITALS — BP 108/76 | HR 74 | Temp 98.3°F | Wt 182.0 lb

## 2019-09-09 DIAGNOSIS — M25562 Pain in left knee: Secondary | ICD-10-CM

## 2019-09-09 DIAGNOSIS — R29898 Other symptoms and signs involving the musculoskeletal system: Secondary | ICD-10-CM | POA: Diagnosis not present

## 2019-09-09 DIAGNOSIS — M5442 Lumbago with sciatica, left side: Secondary | ICD-10-CM | POA: Diagnosis not present

## 2019-09-09 DIAGNOSIS — G8929 Other chronic pain: Secondary | ICD-10-CM | POA: Diagnosis not present

## 2019-09-09 DIAGNOSIS — M25561 Pain in right knee: Secondary | ICD-10-CM | POA: Diagnosis not present

## 2019-09-09 DIAGNOSIS — L2082 Flexural eczema: Secondary | ICD-10-CM

## 2019-09-09 DIAGNOSIS — M5441 Lumbago with sciatica, right side: Secondary | ICD-10-CM

## 2019-09-09 MED ORDER — TRAMADOL HCL 50 MG PO TABS: 50.0000 mg | ORAL_TABLET | Freq: Three times a day (TID) | ORAL | 0 refills | Status: AC | PRN

## 2019-09-09 MED ORDER — CELECOXIB 100 MG PO CAPS
100.0000 mg | ORAL_CAPSULE | Freq: Two times a day (BID) | ORAL | 2 refills | Status: DC
Start: 2019-09-09 — End: 2019-11-26
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
## 2019-09-09 NOTE — Patient Instructions (Signed)
Sciatica  Sciatica is pain, weakness, tingling, or loss of feeling (numbness) along the sciatic nerve. The sciatic nerve starts in the lower back and goes down the back of each leg. Sciatica usually goes away on its own or with treatment. Sometimes, sciatica may come back (recur). What are the causes? This condition happens when the sciatic nerve is pinched or has pressure put on it. This may be the result of:  A disk in between the bones of the spine bulging out too far (herniated disk).  Changes in the spinal disks that occur with aging.  A condition that affects a muscle in the butt.  Extra bone growth near the sciatic nerve.  A break (fracture) of the area between your hip bones (pelvis).  Pregnancy.  Tumor. This is rare. What increases the risk? You are more likely to develop this condition if you:  Play sports that put pressure or stress on the spine.  Have poor strength and ease of movement (flexibility).  Have had a back injury in the past.  Have had back surgery.  Sit for long periods of time.  Do activities that involve bending or lifting over and over again.  Are very overweight (obese). What are the signs or symptoms? Symptoms can vary from mild to very bad. They may include:  Any of these problems in the lower back, leg, hip, or butt: ? Mild tingling, loss of feeling, or dull aches. ? Burning sensations. ? Sharp pains.  Loss of feeling in the back of the calf or the sole of the foot.  Leg weakness.  Very bad back pain that makes it hard to move. These symptoms may get worse when you cough, sneeze, or laugh. They may also get worse when you sit or stand for long periods of time. How is this treated? This condition often gets better without any treatment. However, treatment may include:  Changing or cutting back on physical activity when you have pain.  Doing exercises and stretching.  Putting ice or heat on the affected area.  Medicines that  help: ? To relieve pain and swelling. ? To relax your muscles.  Shots (injections) of medicines that help to relieve pain, irritation, and swelling.  Surgery. Follow these instructions at home: Medicines  Take over-the-counter and prescription medicines only as told by your doctor.  Ask your doctor if the medicine prescribed to you: ? Requires you to avoid driving or using heavy machinery. ? Can cause trouble pooping (constipation). You may need to take these steps to prevent or treat trouble pooping:  Drink enough fluids to keep your pee (urine) pale yellow.  Take over-the-counter or prescription medicines.  Eat foods that are high in fiber. These include beans, whole grains, and fresh fruits and vegetables.  Limit foods that are high in fat and sugar. These include fried or sweet foods. Managing pain      If told, put ice on the affected area. ? Put ice in a plastic bag. ? Place a towel between your skin and the bag. ? Leave the ice on for 20 minutes, 2-3 times a day.  If told, put heat on the affected area. Use the heat source that your doctor tells you to use, such as a moist heat pack or a heating pad. ? Place a towel between your skin and the heat source. ? Leave the heat on for 20-30 minutes. ? Remove the heat if your skin turns bright red. This is very important if you are   unable to feel pain, heat, or cold. You may have a greater risk of getting burned. Activity   Return to your normal activities as told by your doctor. Ask your doctor what activities are safe for you.  Avoid activities that make your symptoms worse.  Take short rests during the day. ? When you rest for a long time, do some physical activity or stretching between periods of rest. ? Avoid sitting for a long time without moving. Get up and move around at least one time each hour.  Exercise and stretch regularly, as told by your doctor.  Do not lift anything that is heavier than 10 lb (4.5 kg)  while you have symptoms of sciatica. ? Avoid lifting heavy things even when you do not have symptoms. ? Avoid lifting heavy things over and over.  When you lift objects, always lift in a way that is safe for your body. To do this, you should: ? Bend your knees. ? Keep the object close to your body. ? Avoid twisting. General instructions  Stay at a healthy weight.  Wear comfortable shoes that support your feet. Avoid wearing high heels.  Avoid sleeping on a mattress that is too soft or too hard. You might have less pain if you sleep on a mattress that is firm enough to support your back.  Keep all follow-up visits as told by your doctor. This is important. Contact a doctor if:  You have pain that: ? Wakes you up when you are sleeping. ? Gets worse when you lie down. ? Is worse than the pain you have had in the past. ? Lasts longer than 4 weeks.  You lose weight without trying. Get help right away if:  You cannot control when you pee (urinate) or poop (have a bowel movement).  You have weakness in any of these areas and it gets worse: ? Lower back. ? The area between your hip bones. ? Butt. ? Legs.  You have redness or swelling of your back.  You have a burning feeling when you pee. Summary  Sciatica is pain, weakness, tingling, or loss of feeling (numbness) along the sciatic nerve.  This condition happens when the sciatic nerve is pinched or has pressure put on it.  Sciatica can cause pain, tingling, or loss of feeling (numbness) in the lower back, legs, hips, and butt.  Treatment often includes rest, exercise, medicines, and putting ice or heat on the affected area. This information is not intended to replace advice given to you by your health care provider. Make sure you discuss any questions you have with your health care provider. Document Revised: 03/31/2018 Document Reviewed: 03/31/2018 Elsevier Patient Education  2020 [employer]  

## 2019-09-09 NOTE — Telephone Encounter (Signed)
Called pt to schedule his MRI L Spine.  Pt states do not bill to Medicare.  His [email or web address]  (?Ena Dawley) is handling payment.  They are not scheduling his exam for him just the payment.  Lmom on adjuster's [phone].

## 2019-09-11 ENCOUNTER — Other Ambulatory Visit: Payer: Self-pay | Admitting: Internal Medicine

## 2019-09-16 ENCOUNTER — Ambulatory Visit (HOSPITAL_COMMUNITY)
Admission: RE | Admit: 2019-09-16 | Discharge: 2019-09-16 | Disposition: A | Discharge status: Home / Self Care | Payer: Medicare Other | Source: Ambulatory Visit | Attending: Internal Medicine | Admitting: Internal Medicine

## 2019-09-16 ENCOUNTER — Other Ambulatory Visit: Payer: Self-pay

## 2019-09-16 DIAGNOSIS — R29898 Other symptoms and signs involving the musculoskeletal system: Secondary | ICD-10-CM | POA: Diagnosis present

## 2019-09-16 DIAGNOSIS — G8929 Other chronic pain: Secondary | ICD-10-CM | POA: Diagnosis present

## 2019-09-16 DIAGNOSIS — M5442 Lumbago with sciatica, left side: Secondary | ICD-10-CM | POA: Insufficient documentation

## 2019-09-16 DIAGNOSIS — M5441 Lumbago with sciatica, right side: Secondary | ICD-10-CM | POA: Insufficient documentation

## 2019-09-16 DIAGNOSIS — M25562 Pain in left knee: Secondary | ICD-10-CM | POA: Insufficient documentation

## 2019-09-16 DIAGNOSIS — M25561 Pain in right knee: Secondary | ICD-10-CM | POA: Insufficient documentation

## 2019-09-17 NOTE — Addendum Note (Signed)
Addended by: [name] on: 09/17/2019 05:37 PM   Modules accepted: Orders

## 2019-09-22 ENCOUNTER — Other Ambulatory Visit: Payer: Self-pay | Admitting: Internal Medicine

## 2019-09-22 DIAGNOSIS — G8929 Other chronic pain: Secondary | ICD-10-CM

## 2019-09-22 DIAGNOSIS — M25562 Pain in left knee: Secondary | ICD-10-CM

## 2019-10-01 ENCOUNTER — Other Ambulatory Visit: Payer: Self-pay | Admitting: Internal Medicine

## 2019-10-13 DIAGNOSIS — M170 Bilateral primary osteoarthritis of knee: Secondary | ICD-10-CM | POA: Diagnosis not present

## 2019-10-13 DIAGNOSIS — M25562 Pain in left knee: Secondary | ICD-10-CM | POA: Diagnosis not present

## 2019-10-13 DIAGNOSIS — M25561 Pain in right knee: Secondary | ICD-10-CM | POA: Diagnosis not present

## 2019-10-20 ENCOUNTER — Other Ambulatory Visit: Payer: Self-pay | Admitting: Internal Medicine
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
## 2019-10-21 ENCOUNTER — Ambulatory Visit: Payer: Medicare Other | Admitting: Family

## 2019-11-08 ENCOUNTER — Emergency Department (HOSPITAL_COMMUNITY): Payer: Medicare Other

## 2019-11-08 ENCOUNTER — Observation Stay (HOSPITAL_COMMUNITY)
Admission: EM | Admit: 2019-11-08 | Discharge: 2019-11-09 | Disposition: A | Discharge status: Home / Self Care | Payer: Medicare Other | Attending: Internal Medicine | Admitting: Internal Medicine

## 2019-11-08 ENCOUNTER — Other Ambulatory Visit: Payer: Self-pay

## 2019-11-08 DIAGNOSIS — F17210 Nicotine dependence, cigarettes, uncomplicated: Secondary | ICD-10-CM | POA: Insufficient documentation

## 2019-11-08 DIAGNOSIS — R112 Nausea with vomiting, unspecified: Secondary | ICD-10-CM | POA: Diagnosis not present

## 2019-11-08 DIAGNOSIS — G459 Transient cerebral ischemic attack, unspecified: Principal | ICD-10-CM | POA: Diagnosis present

## 2019-11-08 DIAGNOSIS — I5020 Unspecified systolic (congestive) heart failure: Secondary | ICD-10-CM | POA: Insufficient documentation

## 2019-11-08 DIAGNOSIS — Z79899 Other long term (current) drug therapy: Secondary | ICD-10-CM | POA: Insufficient documentation

## 2019-11-08 DIAGNOSIS — G319 Degenerative disease of nervous system, unspecified: Secondary | ICD-10-CM | POA: Diagnosis not present

## 2019-11-08 DIAGNOSIS — R1110 Vomiting, unspecified: Secondary | ICD-10-CM | POA: Diagnosis not present

## 2019-11-08 DIAGNOSIS — E119 Type 2 diabetes mellitus without complications: Secondary | ICD-10-CM | POA: Diagnosis not present

## 2019-11-08 DIAGNOSIS — I110 Hypertensive heart disease with heart failure: Secondary | ICD-10-CM | POA: Diagnosis not present

## 2019-11-08 DIAGNOSIS — R42 Dizziness and giddiness: Secondary | ICD-10-CM | POA: Diagnosis not present

## 2019-11-08 DIAGNOSIS — Z7984 Long term (current) use of oral hypoglycemic drugs: Secondary | ICD-10-CM | POA: Insufficient documentation

## 2019-11-08 DIAGNOSIS — Z20822 Contact with and (suspected) exposure to covid-19: Secondary | ICD-10-CM | POA: Insufficient documentation

## 2019-11-08 DIAGNOSIS — I959 Hypotension, unspecified: Secondary | ICD-10-CM | POA: Diagnosis not present

## 2019-11-08 DIAGNOSIS — R110 Nausea: Secondary | ICD-10-CM | POA: Diagnosis not present

## 2019-11-08 DIAGNOSIS — M542 Cervicalgia: Secondary | ICD-10-CM | POA: Diagnosis not present

## 2019-11-08 DIAGNOSIS — N179 Acute kidney failure, unspecified: Secondary | ICD-10-CM | POA: Insufficient documentation

## 2019-11-08 DIAGNOSIS — J449 Chronic obstructive pulmonary disease, unspecified: Secondary | ICD-10-CM | POA: Insufficient documentation

## 2019-11-08 DIAGNOSIS — R52 Pain, unspecified: Secondary | ICD-10-CM | POA: Diagnosis not present

## 2019-11-08 DIAGNOSIS — H3412 Central retinal artery occlusion, left eye: Secondary | ICD-10-CM | POA: Insufficient documentation

## 2019-11-08 DIAGNOSIS — I6782 Cerebral ischemia: Secondary | ICD-10-CM | POA: Diagnosis not present

## 2019-11-08 LAB — DIFFERENTIAL
Abs Immature Granulocytes: 0.04 10*3/uL (ref 0.00–0.07)
Basophils Absolute: 0 10*3/uL (ref 0.0–0.1)
Basophils Relative: 0 %
Eosinophils Absolute: 0.2 10*3/uL (ref 0.0–0.5)
Eosinophils Relative: 7 %
Immature Granulocytes: 1 %
Lymphocytes Relative: 14 %
Lymphs Abs: 0.5 10*3/uL — ABNORMAL LOW (ref 0.7–4.0)
Monocytes Absolute: 0.3 10*3/uL (ref 0.1–1.0)
Monocytes Relative: 7 %
Neutro Abs: 2.4 10*3/uL (ref 1.7–7.7)
Neutrophils Relative %: 71 %

## 2019-11-08 LAB — URINALYSIS, ROUTINE W REFLEX MICROSCOPIC
Bilirubin Urine: NEGATIVE
Glucose, UA: NEGATIVE mg/dL
Hgb urine dipstick: NEGATIVE
Ketones, ur: NEGATIVE mg/dL
Leukocytes,Ua: NEGATIVE
Nitrite: NEGATIVE
Protein, ur: NEGATIVE mg/dL
Specific Gravity, Urine: 1.017 (ref 1.005–1.030)
pH: 5 (ref 5.0–8.0)

## 2019-11-08 LAB — COMPREHENSIVE METABOLIC PANEL
ALT: 16 U/L (ref 0–44)
AST: 25 U/L (ref 15–41)
Albumin: 3.2 g/dL — ABNORMAL LOW (ref 3.5–5.0)
Alkaline Phosphatase: 30 U/L — ABNORMAL LOW (ref 38–126)
Anion gap: 11 (ref 5–15)
BUN: 32 mg/dL — ABNORMAL HIGH (ref 8–23)
CO2: 22 mmol/L (ref 22–32)
Calcium: 9 mg/dL (ref 8.9–10.3)
Chloride: 106 mmol/L (ref 98–111)
Creatinine, Ser: 3.43 mg/dL — ABNORMAL HIGH (ref 0.61–1.24)
GFR calc Af Amer: 21 mL/min — ABNORMAL LOW (ref >60)
GFR calc non Af Amer: 18 mL/min — ABNORMAL LOW (ref >60)
Glucose, Bld: 111 mg/dL — ABNORMAL HIGH (ref 70–99)
Potassium: 4.4 mmol/L (ref 3.5–5.1)
Sodium: 139 mmol/L (ref 135–145)
Total Bilirubin: 0.5 mg/dL (ref 0.3–1.2)
Total Protein: 5.9 g/dL — ABNORMAL LOW (ref 6.5–8.1)

## 2019-11-08 LAB — APTT: aPTT: 23 seconds — ABNORMAL LOW (ref 24–36)

## 2019-11-08 LAB — RETICULOCYTES
Immature Retic Fract: 32 % — ABNORMAL HIGH (ref 2.3–15.9)
RBC.: 2.51 MIL/uL — ABNORMAL LOW (ref 4.22–5.81)
Retic Count, Absolute: 40.4 10*3/uL (ref 19.0–186.0)
Retic Ct Pct: 1.6 % (ref 0.4–3.1)

## 2019-11-08 LAB — SARS CORONAVIRUS 2 BY RT PCR (HOSPITAL ORDER, PERFORMED IN ~~LOC~~ HOSPITAL LAB): SARS Coronavirus 2: NEGATIVE

## 2019-11-08 LAB — PROTIME-INR
INR: 1 (ref 0.8–1.2)
Prothrombin Time: 13.1 seconds (ref 11.4–15.2)

## 2019-11-08 LAB — TROPONIN I (HIGH SENSITIVITY)
Troponin I (High Sensitivity): 8 ng/L (ref <18)
Troponin I (High Sensitivity): 8 ng/L (ref <18)

## 2019-11-08 LAB — CBC
HCT: 26.5 % — ABNORMAL LOW (ref 39.0–52.0)
Hemoglobin: 8.9 g/dL — ABNORMAL LOW (ref 13.0–17.0)
MCH: 35 pg — ABNORMAL HIGH (ref 26.0–34.0)
MCHC: 33.6 g/dL (ref 30.0–36.0)
MCV: 104.3 fL — ABNORMAL HIGH (ref 80.0–100.0)
Platelets: 134 10*3/uL — ABNORMAL LOW (ref 150–400)
RBC: 2.54 MIL/uL — ABNORMAL LOW (ref 4.22–5.81)
RDW: 15.3 % (ref 11.5–15.5)
WBC: 3.4 10*3/uL — ABNORMAL LOW (ref 4.0–10.5)
nRBC: 0.6 % — ABNORMAL HIGH (ref 0.0–0.2)

## 2019-11-08 LAB — CK: Total CK: 41 U/L — ABNORMAL LOW (ref 49–397)

## 2019-11-08 LAB — POC OCCULT BLOOD, ED: Fecal Occult Bld: NEGATIVE

## 2019-11-08 LAB — LIPASE, BLOOD: Lipase: 32 U/L (ref 11–51)

## 2019-11-08 MED ORDER — LACTATED RINGERS IV SOLN: INTRAVENOUS | Status: DC

## 2019-11-08 MED ORDER — SODIUM CHLORIDE 0.9 % IV SOLN: INTRAVENOUS | Status: DC

## 2019-11-08 MED ORDER — ONDANSETRON HCL 4 MG/2ML IJ SOLN
4.0000 mg | Freq: Once | INTRAMUSCULAR | Status: AC
  Administered 2019-11-08: 4 mg via INTRAVENOUS
  Filled 2019-11-08: qty 2

## 2019-11-08 MED ORDER — LACTATED RINGERS IV BOLUS
1000.0000 mL | Freq: Once | INTRAVENOUS | Status: AC
  Administered 2019-11-08: 1000 mL via INTRAVENOUS

## 2019-11-08 MED ORDER — MORPHINE SULFATE (PF) 4 MG/ML IV SOLN
4.0000 mg | Freq: Once | INTRAVENOUS | Status: AC
  Administered 2019-11-08: 4 mg via INTRAVENOUS
  Filled 2019-11-08: qty 1

## 2019-11-08 NOTE — Consult Note (Signed)
[facility] Tele[facility]rology Consult Services  Stat Consult  Date of Service:   11/08/2019 21:56:19  Impression:       G45.9 - Transient cerebral ischemic attack, unspecified  Comments/Sign-Out: Pt is a 17 YOM with multiple vascular risk factors and history of left central retinal artery occlusion who had a episode of binocular vision loss and went back to normal and left orbit pain in AM. NIHSS: 0. he was not a thrombolytic candidate currently. Deferred CTA. Admit for TIA/STROKE workup.  CT HEAD: Showed No Acute Hemorrhage or Acute Core Infarct.  Metrics: [facility] Notification Time: 11/08/2019 21:53:48 Stamp Time: 11/08/2019 11:17:35 Callback Response Time: 11/08/2019 21:57:44  Our recommendations are outlined below.  Recommendations:       Monitor neuro checks/VS q4h with telemetry.       Recommend fall precautions precautions.       Goal SBP b/w 100-140.       Start ASA + STATIN if no contraindications.       Get MRI BRAIN W/ and W/O, CAROTID US, and ECHO.       Get ESR/CRP, TROP, CK, TSH, B12, BNP, LACTIC ACID, LIPID PANEL, and A1C.       Get WORKUP for TOXIC/METABOLIC/INFECTIOUS causes.  Imaging Studies:       MRI Head with and Without Contrast       Carotid Dopplers       Echocardiogram - Transthoracic Echocardiogram  Therapies:       Physical Therapy, Occupational Therapy, Speech Therapy Assessment When Applicable  Other WorkUp:       Infectious/metabolic workup per primary team       Check an ammonia level       Check B12 level       Check TSH  Disposition: Neurology Follow Up Recommended  Sign Out:       Discussed with Emergency Department Provider  ----------------------------------------------------------------------------------------------------  Chief Complaint: NV, scapular pain, and had a episode of b/l vision loss.  History of Present Illness: Patient is a 64 year old Male.  Pt is a 45 YOM with PMH of  HTN, HLD, DM II, CARDIOMYOPATHY, ETOH ABUSE, TOBACCO USE, left EYE CRAO and central vision loss who presented with NV, scapular pain, and had a episode of b/l vision loss. He stated He had an episode where vision went out in both eyes for about 20-25 min and went back to normal afterwards. He had some pain in left orbit region and NV this morning. He takes ASA + STATIN currently. He smokes about 0.5pk/day. He drinks EOTH intermittently.   Past Medical History:      Hypertension      Diabetes Mellitus      Hyperlipidemia      Coronary Artery Disease      There is NO history of Atrial Fibrillation      There is NO history of Stroke  Anticoagulant use:  No  Antiplatelet use: ASA    Examination: BP(97/67), Pulse(93), Blood Glucose(111) 1A: Level of Consciousness - Alert; keenly responsive + 0 1B: Ask Month and Age - Both Questions Right + 0 1C: Blink Eyes & Squeeze Hands - Performs Both Tasks + 0 2: Test Horizontal Extraocular Movements - Normal + 0 3: Test Visual Fields - No Visual Loss + 0 4: Test Facial Palsy (Use Grimace if Obtunded) - Normal symmetry + 0 5A: Test Left Arm Motor Drift - No Drift for 10 Seconds + 0 5B: Test Right Arm Motor Drift - No Drift for  10 Seconds + 0 6A: Test Left Leg Motor Drift - No Drift for 5 Seconds + 0 6B: Test Right Leg Motor Drift - No Drift for 5 Seconds + 0 7: Test Limb Ataxia (FNF/Heel-Shin) - No Ataxia + 0 8: Test Sensation - Normal; No sensory loss + 0 9: Test Language/Aphasia - Normal; No aphasia + 0 10: Test Dysarthria - Normal + 0 11: Test Extinction/Inattention - No abnormality + 0  NIHSS Score: 0   Patient/Family was informed the Neurology Consult would occur via TeleHealth consult by way of interactive audio and video telecommunications and consented to receiving care in this manner.  Patient is being evaluated for possible acute neurologic impairment and high probability of imminent or life-threatening deterioration. I spent  total of 30 minutes providing care to this patient, including time for face to face visit via telemedicine, review of medical records, imaging studies and discussion of findings with providers, the patient and/or family.   Dr Currie Paris   [facility] [phone]  Case [id number]
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
## 2019-11-08 NOTE — H&P (Addendum)
TRH H&P    Patient Demographics:    Travis [name], is a 64 y.o. male  MRN: [id number]  DOB - 1955-11-06  Admit Date - 11/08/2019  Referring MD/NP/PA: Varney Biles  Outpatient Primary MD for the patient is [name], NP  Patient coming from: Home  Chief complaint-vision loss   HPI:    Travis [name]  is a 64 y.o. 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  Patient says that he had binocular vision loss for about 10 to 15 minutes where everything became white.  He was unable to see anything.  When he went back home his vision returned.  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  And he complained of left-sided eye pain since this morning.  He denies headache or neck pain.  Denies weakness of extremities.  Denies slurred speech.  No history of seizures.  He denies passing out during the episode.  He was alert throughout the episode.  He denies chest pain or shortness of breath.  Denies any palpitations. Patient did have 2-3 episodes of vomiting.  Denies any blood in the vomitus.  No history of melena.  At this time patient vision is back to his baseline.  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  CT head was unremarkable. Tele neurology was consulted by ED provider.    Review of systems:    In addition to the HPI above,    All other systems reviewed and are negative.    Past History of the following :    Past Medical History:  Diagnosis Date  . Alcohol abuse   . Alcoholic cardiomyopathy ([place]) 03/15/2013   a. 05/2019 Echo: EF 35-40%.  . Allergy   . Asthma   . Central  retinal artery occlusion of left eye 09/13/13  . Chronic anemia   . Clotting disorder ([place])   . Diabetes mellitus, type 2 ([place])    pt reports his DM is gone  . GI bleed    15 years ago  . Gout   . Hemoptysis    secondary to pulmonary edema  . HFrEF (heart failure with reduced ejection fraction) ([place])    a. 12/2010 Echo: EF 20-25%; b 08/2013 Echo: EF 45-50%; c. 01/2015 Echo: EF 25-30%; d. 07/2016 Echo: EF 35-40%; e. 05/2019 Echo: EF 35-40%, glob HK. Mild LVH. Gr1 DD. Nl RV fxn.  . Hyperlipidemia   . Hypertension   . Nonischemic cardiomyopathy ([place])   . Nonobstructive Coronary artery disease    a. 01/2015 Cath: LM nl, LAD 30p/m, LCX nl, RCA 10p/m, RPDA min irregs.  . Osteoarthritis   . Pneumonia   . TIA (transient ischemic attack)   . Tobacco abuse   . Vision loss    peripherial vision only  left eye.[facility] Retinla  artery occusion       Past Surgical History:  Procedure Laterality Date  . CARDIAC CATHETERIZATION    . CARDIAC CATHETERIZATION N/A 01/28/2015   Procedure: Left Heart Cath and Coronary Angiography;  Surgeon: Wellington Hampshire, MD;  Location: [facility];  Service: Cardiovascular;  Laterality: N/A;  . COLONOSCOPY    . HIP ARTHROPLASTY Right 03/15/2013   Procedure: ARTHROPLASTY BIPOLAR HIP;  Surgeon: [name], MD;  Location: [facility];  Service: Orthopedics;  Laterality: Right;  . TOTAL SHOULDER ARTHROPLASTY Right 09/01/2015   Procedure: RIGHT TOTAL SHOULDER ARTHROPLASTY;  Surgeon: [name], MD;  Location: [facility];  Service: Orthopedics;  Laterality: Right;      Social History:      Social History   Tobacco Use  . Smoking status: Current Every Day Smoker    Packs/day: 0.50    Years: 30.00    Pack years: 15.00    Types: Cigarettes  . Smokeless tobacco: Never Used  Substance Use Topics  . Alcohol use: Yes    Alcohol/week: 1.0 standard drink    Types: 1 Glasses of wine per week    Comment: occasionally       Family History :     Family  History  Problem Relation Age of Onset  . Diabetes Mother   . Hypertension Mother   . Diabetes Father   . Hypertension Father   . Diabetes Sister   . Dementia Brother   . Cancer Neg Hx   . Heart disease Neg Hx   . Stroke Neg Hx   . Colon cancer Neg Hx   . Esophageal cancer Neg Hx   . Rectal cancer Neg Hx   . Stomach cancer Neg Hx       Home Medications:   Prior to Admission medications   Medication Sig Start Date End Date Taking? Authorizing Provider  albuterol (PROVENTIL HFA;VENTOLIN HFA) 108 (90 BASE) MCG/ACT inhaler Inhale 2 puffs into the lungs every 6 (six) hours as needed for wheezing or shortness of breath. Take 2 puffs every 5-10 minutes up to 6 puffs total over 15 minutes when needed.  Use with a spacer. 01/24/15  Yes Ahmed Prima, MD  allopurinol (ZYLOPRIM) 100 MG tablet Take 2 tablets (200 mg total) by mouth daily. 01/23/19  Yes [name], NP  aspirin EC 325 MG tablet Take 162.5 mg by mouth daily.   Yes [provider]  atorvastatin (LIPITOR) 20 MG tablet Take 1 tablet (20 mg total) by mouth daily. 06/19/17 11/07/20 Yes Wellington Hampshire, MD  baclofen (LIORESAL) 10 MG tablet Take 1 tablet (10 mg total) by mouth 3 (three) times daily as needed for muscle spasms (sedation caution). Patient taking differently: Take 10 mg by mouth 3 (three) times daily as needed for muscle spasms.  06/25/19  Yes Tonia Ghent, MD  carvedilol (COREG) 12.5 MG tablet Take 1 tablet (12.5 mg total) by mouth 2 (two) times daily with a meal. 08/28/19  Yes Darylene Price A, FNP  celecoxib (CELEBREX) 100 MG capsule Take 1 capsule (100 mg total) by mouth 2 (two) times daily. 09/09/19  Yes Baity, Coralie Keens, NP  cetirizine (ZYRTEC) 10 MG tablet TAKE 1 TABLET BY MOUTH DAILY Patient taking differently: Take 10 mg by mouth daily.  07/22/19  Yes Baity, Coralie Keens, NP  cyclobenzaprine (FLEXERIL) 10 MG tablet Take 1 tablet (10 mg total) by mouth 3 (three) times daily as needed for muscle spasms.  07/30/19  Yes Baity, Coralie Keens, NP  ENTRESTO 97-103 MG TAKE 1 TABLET BY MOUTH TWICE (2) DAILY Patient taking differently: Take 1 tablet by mouth 2 (two) times daily.  05/27/19  Yes Hackney, Tina A, FNP  fenofibrate (TRICOR) 145 MG tablet TAKE 1 TABLET BY MOUTH ONCE A DAY Patient taking differently: Take 145 mg by mouth daily.  10/20/19  Yes Baity, Coralie Keens, NP  fluticasone (FLONASE) 50 MCG/ACT nasal spray Place 1 spray into both nostrils 2 (two) times daily. Patient taking differently: Place 1 spray into both nostrils daily as needed for allergies or rhinitis.  03/29/16  Yes Cuthriell, Charline Bills, PA-C  folic acid (FOLVITE) 1 MG tablet Take 1 tablet (1 mg total) by mouth daily. Patient taking differently: Take 0.5 mg by mouth daily.  09/20/16  Yes Hackney, Tina A, FNP  lidocaine (LIDODERM) 5 % Place 1 patch onto the skin daily. Remove & Discard patch within 12 hours or as directed by MD Patient taking differently: Place 1 patch onto the skin daily as needed (pain). Remove & Discard patch within 12 hours or as directed by MD 07/30/19  Yes [name], NP  Multiple Vitamin (MULTIVITAMIN WITH MINERALS) TABS tablet Take 1 tablet by mouth daily. Men's One a Day   Yes [provider]  Omega-3 Fatty Acids (FISH OIL PO) Take 1 capsule by mouth daily.   Yes [provider]  spironolactone (ALDACTONE) 25 MG tablet Take 0.5 tablets (12.5 mg total) by mouth daily. 07/03/19  Yes Hackney, Otila Kluver A, FNP  triamcinolone cream (KENALOG) 0.1 % APPLY TO AFFECTED AREAS TWICE A DAY AS NEEDED. AVOID FACE,GROIN, OR UNDERARMS Patient taking differently: Apply 1 application topically 2 (two) times daily as needed.  10/01/19  Yes [name], NP     Allergies:    No Known Allergies   Physical Exam:   Vitals  Blood pressure 98/68, pulse 95, temperature 98 F (36.7 C), temperature source Oral, resp. rate (!) 23, height '6\' 4"'  (1.93 m), weight 79.4 kg, SpO2 97 %.  1.  General: Appears in no acute  distress  2. Psychiatric: Alert, oriented x4, intact insight and judgment  3. Neurologic: Cranial nerves II through XII grossly intact, motor strength 5/5 in all extremities  4. HEENMT:  Atraumatic normocephalic, PERRLA, extraocular muscles are intact  5. Respiratory : Clear to auscultation bilaterally, no wheezing or crackles auscultated  6. Cardiovascular : S1-S2, regular, no murmur auscultated  7. Gastrointestinal:  Abdomen is soft, nontender, no organomegaly     Data Review:    CBC Recent Labs  Lab 11/08/19 1453  WBC 3.4*  HGB 8.9*  HCT 26.5*  PLT 134*  MCV 104.3*  MCH 35.0*  MCHC 33.6  RDW 15.3  LYMPHSABS 0.5*  MONOABS 0.3  EOSABS 0.2  BASOSABS 0.0   ------------------------------------------------------------------------------------------------------------------  Results for orders placed or performed during the hospital encounter of 11/08/19 (from the past 48 hour(s))  Protime-INR     Status: None   Collection Time: 11/08/19  2:42 PM  Result Value Ref Range   Prothrombin Time 13.1 11.4 - 15.2 seconds   INR 1.0 0.8 - 1.2    Comment: (NOTE) INR goal varies based on device and disease states. Performed at [facility], [place] [address] [place], [place] [address]   APTT     Status: Abnormal   Collection Time: 11/08/19  2:42 PM  Result Value Ref Range   aPTT 23 (L) 24 - 36 seconds  Comment: Performed at [facility], [place] [address] [place], [place] [address]  Reticulocytes     Status: Abnormal   Collection Time: 11/08/19  2:42 PM  Result Value Ref Range   Retic Ct Pct 1.6 0.4 - 3.1 %   RBC. 2.51 (L) 4.22 - 5.81 MIL/uL   Retic Count, Absolute 40.4 19.0 - 186.0 K/uL   Immature Retic Fract 32.0 (H) 2.3 - 15.9 %    Comment: Performed at [facility], [place] [address] [place], [place] [address]  Lipase, blood     Status: None   Collection Time: 11/08/19  2:53 PM  Result Value Ref Range    Lipase 32 11 - 51 U/L    Comment: Performed at [facility], [place] [address], [place], [place] [address]  Comprehensive metabolic panel     Status: Abnormal   Collection Time: 11/08/19  2:53 PM  Result Value Ref Range   Sodium 139 135 - 145 mmol/L   Potassium 4.4 3.5 - 5.1 mmol/L   Chloride 106 98 - 111 mmol/L   CO2 22 22 - 32 mmol/L   Glucose, Bld 111 (H) 70 - 99 mg/dL    Comment: Glucose reference range applies only to samples taken after fasting for at least 8 hours.   BUN 32 (H) 8 - 23 mg/dL   Creatinine, Ser 3.43 (H) 0.61 - 1.24 mg/dL   Calcium 9.0 8.9 - 10.3 mg/dL   Total Protein 5.9 (L) 6.5 - 8.1 g/dL   Albumin 3.2 (L) 3.5 - 5.0 g/dL   AST 25 15 - 41 U/L   ALT 16 0 - 44 U/L   Alkaline Phosphatase 30 (L) 38 - 126 U/L   Total Bilirubin 0.5 0.3 - 1.2 mg/dL   GFR calc non Af Amer 18 (L) >60 mL/min   GFR calc Af Amer 21 (L) >60 mL/min   Anion gap 11 5 - 15    Comment: Performed at [facility], [place] [address] [place], [place] [address]  CBC     Status: Abnormal   Collection Time: 11/08/19  2:53 PM  Result Value Ref Range   WBC 3.4 (L) 4.0 - 10.5 K/uL   RBC 2.54 (L) 4.22 - 5.81 MIL/uL   Hemoglobin 8.9 (L) 13.0 - 17.0 g/dL   HCT 26.5 (L) 39 - 52 %   MCV 104.3 (H) 80.0 - 100.0 fL   MCH 35.0 (H) 26.0 - 34.0 pg   MCHC 33.6 30.0 - 36.0 g/dL   RDW 15.3 11.5 - 15.5 %   Platelets 134 (L) 150 - 400 K/uL   nRBC 0.6 (H) 0.0 - 0.2 %    Comment: Performed at [facility], [place] [address] [place], [place] [address]  Differential     Status: Abnormal   Collection Time: 11/08/19  2:53 PM  Result Value Ref Range   Neutrophils Relative % 71 %   Neutro Abs 2.4 1.7 - 7.7 K/uL   Lymphocytes Relative 14 %   Lymphs Abs 0.5 (L) 0.7 - 4.0 K/uL   Monocytes Relative 7 %   Monocytes Absolute 0.3 0 - 1 K/uL   Eosinophils Relative 7 %   Eosinophils Absolute 0.2 0 - 0 K/uL   Basophils Relative 0 %   Basophils Absolute 0.0 0 - 0 K/uL    Immature Granulocytes 1 %   Abs Immature Granulocytes 0.04 0.00 - 0.07 K/uL    Comment: Performed at [facility], [place] [address], [place], [place] [address]  Troponin I (High Sensitivity)     Status: None   Collection Time: 11/08/19  3:27 PM  Result Value Ref Range   Troponin I (High Sensitivity) 8 <18 ng/L    Comment: (NOTE) Elevated high sensitivity troponin I (hsTnI) values and significant  changes across serial measurements may suggest ACS but many other  chronic and acute conditions are known to elevate hsTnI results.  Refer to the "Links" section for chest pain algorithms and additional  guidance. Performed at [facility], [place] [address], [place], [place] [address]   CK     Status: Abnormal   Collection Time: 11/08/19  3:27 PM  Result Value Ref Range   Total CK 41 (L) 49.0 - 397.0 U/L    Comment: Performed at [facility], [place] [address], [place], [place] [address]  SARS Coronavirus 2 by RT PCR (hospital order, performed in [facility] hospital lab) Nasopharyngeal Nasopharyngeal Swab     Status: None   Collection Time: 11/08/19  4:14 PM   Specimen: Nasopharyngeal Swab  Result Value Ref Range   SARS Coronavirus 2 NEGATIVE NEGATIVE    Comment: (NOTE) SARS-CoV-2 target nucleic acids are NOT DETECTED.  The SARS-CoV-2 RNA is generally detectable in upper and lower respiratory specimens during the acute phase of infection. The lowest concentration of SARS-CoV-2 viral copies this assay can detect is 250 copies / mL. A negative result does not preclude SARS-CoV-2 infection and should not be used as the sole basis for treatment or other patient management decisions.  A negative result may occur with improper specimen collection / handling, submission of specimen other than nasopharyngeal swab, presence of viral mutation(s) within the areas targeted by this assay, and inadequate number of viral copies (<250 copies / mL). A  negative result must be combined with clinical observations, patient history, and epidemiological information.  Fact Sheet for Patients:   StrictlyIdeas.no  Fact Sheet for Healthcare Providers: BankingDealers.co.za  This test is not yet approved or  cleared by the [place] FDA and has been authorized for detection and/or diagnosis of SARS-CoV-2 by FDA under an Emergency Use Authorization (EUA).  This EUA will remain in effect (meaning this test can be used) for the duration of the COVID-19 declaration under Section 564(b)(1) of the Act, 21 U.S.C. section 360bbb-3(b)(1), unless the authorization is terminated or revoked sooner.  Performed at [facility], [place] [address] [place], [place] [address]   POC occult blood, ED     Status: None   Collection Time: 11/08/19  4:58 PM  Result Value Ref Range   Fecal Occult Bld NEGATIVE NEGATIVE  Urinalysis, Routine w reflex microscopic Urine, Clean Catch     Status: Abnormal   Collection Time: 11/08/19  7:00 PM  Result Value Ref Range   Color, Urine AMBER (A) YELLOW    Comment: BIOCHEMICALS MAY BE AFFECTED BY COLOR   APPearance CLEAR CLEAR   Specific Gravity, Urine 1.017 1.005 - 1.030   pH 5.0 5.0 - 8.0   Glucose, UA NEGATIVE NEGATIVE mg/dL   Hgb urine dipstick NEGATIVE NEGATIVE   Bilirubin Urine NEGATIVE NEGATIVE   Ketones, ur NEGATIVE NEGATIVE mg/dL   Protein, ur NEGATIVE NEGATIVE mg/dL   Nitrite NEGATIVE NEGATIVE   Leukocytes,Ua NEGATIVE NEGATIVE    Comment: Performed at [place] [address] [place], [place] [address]  Troponin I (High Sensitivity)     Status: None  Collection Time: 11/08/19  7:00 PM  Result Value Ref Range   Troponin I (High Sensitivity) 8 <18 ng/L    Comment: (NOTE) Elevated high sensitivity troponin I (hsTnI) values and significant  changes across serial measurements may suggest ACS but many other  chronic and  acute conditions are known to elevate hsTnI results.  Refer to the "Links" section for chest pain algorithms and additional  guidance. Performed at [facility], [place] [address], [place], [place] [address]     Chemistries  Recent Labs  Lab 11/08/19 1453  NA 139  K 4.4  CL 106  CO2 22  GLUCOSE 111*  BUN 32*  CREATININE 3.43*  CALCIUM 9.0  AST 25  ALT 16  ALKPHOS 30*  BILITOT 0.5   ------------------------------------------------------------------------------------------------------------------  ------------------------------------------------------------------------------------------------------------------ GFR: Estimated Creatinine Clearance: 24.8 mL/min (A) (by C-G formula based on SCr of 3.43 mg/dL (H)). Liver Function Tests: Recent Labs  Lab 11/08/19 1453  AST 25  ALT 16  ALKPHOS 30*  BILITOT 0.5  PROT 5.9*  ALBUMIN 3.2*   Recent Labs  Lab 11/08/19 1453  LIPASE 32   No results for input(s): AMMONIA in the last 168 hours. Coagulation Profile: Recent Labs  Lab 11/08/19 1442  INR 1.0   Cardiac Enzymes: Recent Labs  Lab 11/08/19 1527  CKTOTAL 41*   Anemia Panel: Recent Labs    11/08/19 1442  RETICCTPCT 1.6    --------------------------------------------------------------------------------------------------------------- Urine analysis:    Component Value Date/Time   COLORURINE AMBER (A) 11/08/2019 1900   APPEARANCEUR CLEAR 11/08/2019 1900   APPEARANCEUR Cloudy 11/19/2012 1107   LABSPEC 1.017 11/08/2019 1900   LABSPEC 1.024 11/19/2012 1107   PHURINE 5.0 11/08/2019 1900   GLUCOSEU NEGATIVE 11/08/2019 1900   GLUCOSEU Negative 11/19/2012 1107   HGBUR NEGATIVE 11/08/2019 1900   BILIRUBINUR NEGATIVE 11/08/2019 1900   BILIRUBINUR Negative 11/19/2012 1107   KETONESUR NEGATIVE 11/08/2019 1900   PROTEINUR NEGATIVE 11/08/2019 1900   UROBILINOGEN 0.2 09/14/2013 1225   NITRITE NEGATIVE 11/08/2019 1900   LEUKOCYTESUR NEGATIVE  11/08/2019 1900   LEUKOCYTESUR Negative 11/19/2012 1107      Imaging Results:    CT Head Wo Contrast  Result Date: 11/08/2019 CLINICAL DATA:  Transient ischemic attack (TIA) Patient reports acute onset of nausea, vomiting and vision changes. EXAM: CT HEAD WITHOUT CONTRAST TECHNIQUE: Contiguous axial images were obtained from the base of the skull through the vertex without intravenous contrast. COMPARISON:  Head CT 07/26/2019 FINDINGS: Brain: Stable degree of atrophy and chronic small vessel ischemia. No intracranial hemorrhage, mass effect, or midline shift. No hydrocephalus. The basilar cisterns are patent. No evidence of territorial infarct or acute ischemia. No extra-axial or intracranial fluid collection. Vascular: No hyperdense vessel. Skull: No fracture or focal lesion. Sinuses/Orbits: Paranasal sinuses and mastoid air cells are clear. The visualized orbits are unremarkable. Other: None. IMPRESSION: 1. No acute intracranial abnormality. 2. Stable atrophy and chronic small vessel ischemia. Electronically Signed   By: Keith Rake M.D.   On: 11/08/2019 19:24   DG Chest Port 1 View  Result Date: 11/08/2019 CLINICAL DATA:  Vomiting. EXAM: PORTABLE CHEST 1 VIEW COMPARISON:  Jul 26, 2019 FINDINGS: There is no evidence of acute infiltrate, pleural effusion or pneumothorax. The heart size and mediastinal contours are within normal limits. An intact right shoulder replacement is seen. The visualized skeletal structures are otherwise unremarkable. IMPRESSION: No active disease. Electronically Signed   By: Virgina Norfolk M.D.   On: 11/08/2019 16:13    My personal review  of EKG: Rhythm NSR, no ST changes   Assessment & Plan:    Active Problems:   * No active hospital problems. *   1. Transient visual loss-unclear etiology, patient has history of left central retinal artery occlusion.  He is back to baseline.  Ophthalmology was consulted by ED provider, Dr. Posey Pronto recommends patient to be seen  as outpatient after discharge.  Tele neurology was consulted and recommended TIA work-up.  Will obtain MRI brain with and without contrast, carotid Doppler, check hemoglobin A1c, lipid profile.  Check ESR/CRP, TSH.  Neurochecks every 4 hours.  SBP goal between 100-1 40.  Fall precautions.  Will check carotid Dopplers, echocardiogram.  Aspirin 325 mg daily 2. Anemia-patient hemoglobin is 8.9 today.  Previous hemoglobin was 12.9 as of Aug 11, 2019.  Unclear etiology.  Will check anemia panel, stool for occult blood.  No history of blood per rectum or black stool. 3. Acute kidney injury on CKD stage III-patient baseline creatinine around 2-2.4.  Today presenting with creatinine of 3.28.  Patient takes Entresto, Aldactone for nonischemic cardiomyopathy.  Will start gentle IV hydration with normal saline at 50 mill per hour.  Hold Aldactone. 4. Nonischemic cardiomyopathy-patient has nonobstructive CAD, continue Entresto, Coreg, Aldactone.   5. Hyperlipidemia-continue statin, fibrate   DVT Prophylaxis-   Lovenox   AM Labs Ordered, also please review Full Orders  Family Communication: Admission, patients condition and plan of care including tests being ordered have been discussed with the patient  who indicate understanding and agree with the plan and Code Status.  Code Status: Full code  Admission status: Inpatient :The appropriate admission status for this patient is INPATIENT. Inpatient status is judged to be reasonable and necessary in order to provide the required intensity of service to ensure the patient's safety. The patient's presenting symptoms, physical exam findings, and initial radiographic and laboratory data in the context of their chronic comorbidities is felt to place them at high risk for further clinical deterioration. Furthermore, it is not anticipated that the patient will be medically stable for discharge from the hospital within 2 midnights of admission. The following factors support  the admission status of inpatient.     The patient's presenting symptoms include vision loss. The worrisome physical exam findings include none. The initial radiographic and laboratory data are worrisome because of TIA The chronic co-morbidities include central retinal artery occlusion left eye,.        I certify that at the point of admission it is my clinical judgment that the patient will require inpatient hospital care spanning beyond 2 midnights from the point of admission due to high intensity of service, high risk for further deterioration and high frequency of surveillance required.*  Time spent in minutes : 60 minutes   Annell Canty S Maguire Killmer M.D

## 2019-11-08 NOTE — ED Provider Notes (Addendum)
[facility] DEPT Provider Note   CSN: [id number] Arrival date & time: 11/08/19  1442     History No chief complaint on file.   ARNOLD KESTER is a 64 y.o. male.  HPI    64 year old male comes in a chief complaint of vision loss, back pain, dizziness and near fainting.  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  Patient states that he was doing well when suddenly he started having pain in his scapular region.  The pain is on both sides of the scapula and with that he started having binocular vision loss.  Patient had binocular vision loss for about 20 to 25 minutes.  Everything disappeared white at that time.  In general he has left-sided central vision loss, but even that eyesight got worse during that episode.  Currently his vision is back to normal.  Patient did indicate some left-sided eye pain at the time of the vision loss.  There is no eye pain at this time.  Patient denies any headaches, neck pain.  He does indicate that he got dizzy and felt like he might faint.  Patient had no slurred speech, unilateral/focal numbness, weakness, chest pain, shortness of breath, palpitations.  Per EMS patient was hypotensive when they first arrived. Patient reports feeling tremulous.  He denies any fevers, chills, cough, chest pain, uti like symptoms. He has received his COVID-19 vaccine.   Past Medical History:  Diagnosis Date  . Alcohol abuse   . 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 05/2019 Echo: EF 35-40%.  . Allergy   . Asthma   . 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 Chronic anemia   . Clotting disorder ([place])   . Diabetes mellitus, type 2 ([place])    pt reports his DM is gone  . GI bleed    15 years ago  . Gout   . Hemoptysis    secondary to pulmonary edema  . HFrEF (heart failure with reduced ejection fraction) ([place])    a.  12/2010 Echo: EF 20-25%; b 08/2013 Echo: EF 45-50%; c. 01/2015 Echo: EF 25-30%; d. 07/2016 Echo: EF 35-40%; e. 05/2019 Echo: EF 35-40%, glob HK. Mild LVH. Gr1 DD. Nl RV fxn.  . Hyperlipidemia   . Hypertension   . Nonischemic cardiomyopathy ([place])   . Nonobstructive Coronary artery disease    a. 01/2015 Cath: LM nl, LAD 30p/m, LCX nl, RCA 10p/m, RPDA min irregs.  . Osteoarthritis   . Pneumonia   . TIA (transient ischemic attack)   . Tobacco abuse   . Vision loss    peripherial vision only left eye.[place]  artery occusion     Patient Active Problem List   Diagnosis Date Noted  . Osteoarthritis 05/09/2016  . COPD (chronic obstructive pulmonary disease) ([place]) 05/09/2016  . Essential hypertension 04/18/2015  . Literacy level of illiterate 06/04/2014  . 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 Cardiomyopathy, nonischemic ([place]) 03/15/2013  . Chronic systolic heart failure ([place]) 01/05/2011  . Tobacco use 01/05/2011  . Diabetes ([place]) 11/30/2008  . HLD (hyperlipidemia) 11/30/2008  . Gout 11/30/2008    Past Surgical History:  Procedure Laterality Date  . CARDIAC CATHETERIZATION    . CARDIAC CATHETERIZATION N/A 01/28/2015   Procedure: Left Heart Cath and Coronary Angiography;  Surgeon: Wellington Hampshire, MD;  Location: [facility];  Service:  Cardiovascular;  Laterality: N/A;  . COLONOSCOPY    . HIP ARTHROPLASTY Right 03/15/2013   Procedure: ARTHROPLASTY BIPOLAR HIP;  Surgeon: [name], MD;  Location: [facility];  Service: Orthopedics;  Laterality: Right;  . TOTAL SHOULDER ARTHROPLASTY Right 09/01/2015   Procedure: RIGHT TOTAL SHOULDER ARTHROPLASTY;  Surgeon: [name], MD;  Location: [facility];  Service: Orthopedics;  Laterality: Right;       Family History  Problem Relation Age of Onset  . Diabetes Mother   . Hypertension Mother   . Diabetes Father   . Hypertension Father   . Diabetes Sister   . Dementia Brother   . Cancer Neg Hx   . Heart disease  Neg Hx   . Stroke Neg Hx   . Colon cancer Neg Hx   . Esophageal cancer Neg Hx   . Rectal cancer Neg Hx   . Stomach cancer Neg Hx     Social History   Tobacco Use  . Smoking status: Current Every Day Smoker    Packs/day: 0.50    Years: 30.00    Pack years: 15.00    Types: Cigarettes  . Smokeless tobacco: Never Used  Substance Use Topics  . Alcohol use: Yes    Alcohol/week: 1.0 standard drink    Types: 1 Glasses of wine per week    Comment: occasionally  . Drug use: Yes    Types: Marijuana    Comment: 1 blunt 1-2 times a month- 08/29/15/17- "last week" 04/17/17-2 weeks ago    Home Medications Prior to Admission medications   Medication Sig Start Date End Date Taking? Authorizing Provider  albuterol (PROVENTIL HFA;VENTOLIN HFA) 108 (90 BASE) MCG/ACT inhaler Inhale 2 puffs into the lungs every 6 (six) hours as needed for wheezing or shortness of breath. Take 2 puffs every 5-10 minutes up to 6 puffs total over 15 minutes when needed.  Use with a spacer. 01/24/15  Yes Ahmed Prima, MD  allopurinol (ZYLOPRIM) 100 MG tablet Take 2 tablets (200 mg total) by mouth daily. 01/23/19  Yes [name], NP  aspirin EC 325 MG tablet Take 162.5 mg by mouth daily.   Yes [provider]  atorvastatin (LIPITOR) 20 MG tablet Take 1 tablet (20 mg total) by mouth daily. 06/19/17 11/07/20 Yes Wellington Hampshire, MD  baclofen (LIORESAL) 10 MG tablet Take 1 tablet (10 mg total) by mouth 3 (three) times daily as needed for muscle spasms (sedation caution). Patient taking differently: Take 10 mg by mouth 3 (three) times daily as needed for muscle spasms.  06/25/19  Yes Tonia Ghent, MD  carvedilol (COREG) 12.5 MG tablet Take 1 tablet (12.5 mg total) by mouth 2 (two) times daily with a meal. 08/28/19  Yes Darylene Price A, FNP  celecoxib (CELEBREX) 100 MG capsule Take 1 capsule (100 mg total) by mouth 2 (two) times daily. 09/09/19  Yes Baity, Coralie Keens, NP  cetirizine (ZYRTEC) 10 MG tablet TAKE 1  TABLET BY MOUTH DAILY Patient taking differently: Take 10 mg by mouth daily.  07/22/19  Yes Baity, Coralie Keens, NP  cyclobenzaprine (FLEXERIL) 10 MG tablet Take 1 tablet (10 mg total) by mouth 3 (three) times daily as needed for muscle spasms. 07/30/19  Yes Baity, Coralie Keens, NP  ENTRESTO 97-103 MG TAKE 1 TABLET BY MOUTH TWICE (2) DAILY Patient taking differently: Take 1 tablet by mouth 2 (two) times daily.  05/27/19  Yes Hackney, Tina A, FNP  fenofibrate (TRICOR) 145 MG tablet TAKE 1  TABLET BY MOUTH ONCE A DAY Patient taking differently: Take 145 mg by mouth daily.  10/20/19  Yes Baity, Coralie Keens, NP  fluticasone (FLONASE) 50 MCG/ACT nasal spray Place 1 spray into both nostrils 2 (two) times daily. Patient taking differently: Place 1 spray into both nostrils daily as needed for allergies or rhinitis.  03/29/16  Yes Cuthriell, Charline Bills, PA-C  folic acid (FOLVITE) 1 MG tablet Take 1 tablet (1 mg total) by mouth daily. Patient taking differently: Take 0.5 mg by mouth daily.  09/20/16  Yes Hackney, Tina A, FNP  lidocaine (LIDODERM) 5 % Place 1 patch onto the skin daily. Remove & Discard patch within 12 hours or as directed by MD Patient taking differently: Place 1 patch onto the skin daily as needed (pain). Remove & Discard patch within 12 hours or as directed by MD 07/30/19  Yes [name], NP  Multiple Vitamin (MULTIVITAMIN WITH MINERALS) TABS tablet Take 1 tablet by mouth daily. Men's One a Day   Yes [provider]  Omega-3 Fatty Acids (FISH OIL PO) Take 1 capsule by mouth daily.   Yes [provider]  spironolactone (ALDACTONE) 25 MG tablet Take 0.5 tablets (12.5 mg total) by mouth daily. 07/03/19  Yes Hackney, Otila Kluver A, FNP  triamcinolone cream (KENALOG) 0.1 % APPLY TO AFFECTED AREAS TWICE A DAY AS NEEDED. AVOID FACE,GROIN, OR UNDERARMS Patient taking differently: Apply 1 application topically 2 (two) times daily as needed.  10/01/19  Yes [name], NP    Allergies    Patient has no  known allergies.  Review of Systems   Review of Systems  Constitutional: Positive for activity change.  Eyes: Positive for visual disturbance. Negative for photophobia.  Cardiovascular: Negative for chest pain.  Gastrointestinal: Negative for abdominal pain.  Musculoskeletal: Positive for arthralgias and back pain.  Neurological: Positive for dizziness.  All other systems reviewed and are negative.   Physical Exam Updated Vital Signs BP 92/74   Pulse 90   Temp 98 F (36.7 C) (Oral)   Resp 16   Ht 6\' 4"  (1.93 m)   Wt 79.4 kg   SpO2 100%   BMI 21.30 kg/m   Physical Exam Vitals and nursing note reviewed.  Constitutional:      Appearance: He is well-developed.  HENT:     Head: Atraumatic.  Eyes:     Extraocular Movements: Extraocular movements intact.     Pupils: Pupils are equal, round, and reactive to light.     Comments: No rapid afferent pupillary defect  Cardiovascular:     Rate and Rhythm: Normal rate.  Pulmonary:     Effort: Pulmonary effort is normal.  Musculoskeletal:     Cervical back: Neck supple.  Skin:    General: Skin is warm.  Neurological:     Mental Status: He is alert and oriented to person, place, and time.  Psychiatric:        Mood and Affect: Mood normal.        Behavior: Behavior normal.     ED Results / Procedures / Treatments   Labs (all labs ordered are listed, but only abnormal results are displayed) Labs Reviewed  COMPREHENSIVE METABOLIC PANEL - Abnormal; Notable for the following components:      Result Value   Glucose, Bld 111 (*)    BUN 32 (*)    Creatinine, Ser 3.43 (*)    Total Protein 5.9 (*)    Albumin 3.2 (*)    Alkaline Phosphatase  30 (*)    GFR calc non Af Amer 18 (*)    GFR calc Af Amer 21 (*)    All other components within normal limits  CBC - Abnormal; Notable for the following components:   WBC 3.4 (*)    RBC 2.54 (*)    Hemoglobin 8.9 (*)    HCT 26.5 (*)    MCV 104.3 (*)    MCH 35.0 (*)    Platelets 134  (*)    nRBC 0.6 (*)    All other components within normal limits  URINALYSIS, ROUTINE W REFLEX MICROSCOPIC - Abnormal; Notable for the following components:   Color, Urine AMBER (*)    All other components within normal limits  CK - Abnormal; Notable for the following components:   Total CK 41 (*)    All other components within normal limits  APTT - Abnormal; Notable for the following components:   aPTT 23 (*)    All other components within normal limits  RETICULOCYTES - Abnormal; Notable for the following components:   RBC. 2.51 (*)    Immature Retic Fract 32.0 (*)    All other components within normal limits  DIFFERENTIAL - Abnormal; Notable for the following components:   Lymphs Abs 0.5 (*)    All other components within normal limits  SARS CORONAVIRUS 2 BY RT PCR (HOSPITAL ORDER, [facility])  URINE CULTURE  LIPASE, BLOOD  PROTIME-INR  LACTIC ACID, PLASMA  LACTIC ACID, PLASMA  VITAMIN B12  FOLATE  IRON AND TIBC  FERRITIN  SEDIMENTATION RATE  POC OCCULT BLOOD, ED  TYPE AND SCREEN  TROPONIN I (HIGH SENSITIVITY)  TROPONIN I (HIGH SENSITIVITY)    EKG EKG Interpretation  Date/Time:  Sunday November 08 2019 15:21:45 EDT Ventricular Rate:  91 PR Interval:    QRS Duration: 107 QT Interval:  377 QTC Calculation: 464 R Axis:   -40 Text Interpretation: Sinus rhythm Left axis deviation Probable anteroseptal infarct, old No acute changes No significant change since last tracing Confirmed by Varney Biles [id number]) on 11/08/2019 3:28:23 PM   Radiology CT Head Wo Contrast  Result Date: 11/08/2019 CLINICAL DATA:  Transient ischemic attack (TIA) Patient reports acute onset of nausea, vomiting and vision changes. EXAM: CT HEAD WITHOUT CONTRAST TECHNIQUE: Contiguous axial images were obtained from the base of the skull through the vertex without intravenous contrast. COMPARISON:  Head CT 07/26/2019 FINDINGS: Brain: Stable degree of atrophy and chronic small  vessel ischemia. No intracranial hemorrhage, mass effect, or midline shift. No hydrocephalus. The basilar cisterns are patent. No evidence of territorial infarct or acute ischemia. No extra-axial or intracranial fluid collection. Vascular: No hyperdense vessel. Skull: No fracture or focal lesion. Sinuses/Orbits: Paranasal sinuses and mastoid air cells are clear. The visualized orbits are unremarkable. Other: None. IMPRESSION: 1. No acute intracranial abnormality. 2. Stable atrophy and chronic small vessel ischemia. Electronically Signed   By: Keith Rake M.D.   On: 11/08/2019 19:24   DG Chest Port 1 View  Result Date: 11/08/2019 CLINICAL DATA:  Vomiting. EXAM: PORTABLE CHEST 1 VIEW COMPARISON:  Jul 26, 2019 FINDINGS: There is no evidence of acute infiltrate, pleural effusion or pneumothorax. The heart size and mediastinal contours are within normal limits. An intact right shoulder replacement is seen. The visualized skeletal structures are otherwise unremarkable. IMPRESSION: No active disease. Electronically Signed   By: Virgina Norfolk M.D.   On: 11/08/2019 16:13    Procedures Procedures (including critical care time)  Medications Ordered  in ED Medications  lactated ringers infusion (has no administration in time range)  0.9 %  sodium chloride infusion (has no administration in time range)  ondansetron (ZOFRAN) injection 4 mg (4 mg Intravenous Given 11/08/19 1534)  morphine 4 MG/ML injection 4 mg (4 mg Intravenous Given 11/08/19 1534)  lactated ringers bolus 1,000 mL (0 mLs Intravenous Stopped 11/08/19 2238)    ED Course  I have reviewed the triage vital signs and the nursing notes.  Pertinent labs & imaging results that were available during my care of the patient were reviewed by me and considered in my medical decision making (see chart for details).    MDM Rules/Calculators/A&P                          64 year old comes in a chief complaint of dizziness, vision changes, scapular  pain.  He has history of nonischemic cardiomyopathy, diabetes, hypertension, CRAO, alcoholism.  His neurologic exam is normal.  Cardiovascular exam is benign.  Vision is back to baseline. Patient was borderline hypotensive at arrival.  He denies any insensible fluid loss, vomiting, diarrhea, until the event, at which point he did have 2 or 3 episodes of nonbloody emesis.  Based on the history and exam, differential diagnosis includes profound dehydration, electrolyte abnormality, TIA, ACS, questionable or evolving sepsis.  Appropriate labs initiated.  Fluid started.  Patient is noted to have AKI with drop in hemoglobin by almost 4 points.  Patient denies any melena.  He reports that he has cut back on his alcohol use.  On reassessment his pain has resolved.  EKG and tropes are reassuring.  I do not think patient needs a dissection work-up. Teleneurology has been consulted.  Patient will be admitted.  7:15 Dr. Darrick Meigs requested we consider consulting ophthalmology given his history of CRAO.  Spoke with Dr. Posey Pronto, ophthalmology.  He requested patient be given outpatient follow-up when he is discharged.  There is no urgent/emergent evaluation needed since patient's symptoms resolved.    11:15 PM  Spoke with neurology.  They recommend TIA work-up.  They also requested the sed rate.  Final Clinical Impression(s) / ED Diagnoses Final diagnoses:  AKI (acute kidney injury) ([place])  TIA (transient ischemic attack)    Rx / DC Orders ED Discharge Orders    None       Varney Biles, MD 11/08/19 2001    Varney Biles, MD 11/08/19 2316

## 2019-11-08 NOTE — ED Notes (Signed)
Pt resting in stretcher. Offers no complaints. No visual deficits, no neuro deficits at this time.

## 2019-11-09 ENCOUNTER — Encounter (HOSPITAL_COMMUNITY): Payer: Self-pay | Admitting: Family Medicine

## 2019-11-09 ENCOUNTER — Inpatient Hospital Stay (HOSPITAL_COMMUNITY): Payer: Medicare Other

## 2019-11-09 ENCOUNTER — Inpatient Hospital Stay (HOSPITAL_BASED_OUTPATIENT_CLINIC_OR_DEPARTMENT_OTHER): Payer: Medicare Other

## 2019-11-09 DIAGNOSIS — J3489 Other specified disorders of nose and nasal sinuses: Secondary | ICD-10-CM | POA: Diagnosis not present

## 2019-11-09 DIAGNOSIS — I361 Nonrheumatic tricuspid (valve) insufficiency: Secondary | ICD-10-CM

## 2019-11-09 DIAGNOSIS — H748X1 Other specified disorders of right middle ear and mastoid: Secondary | ICD-10-CM | POA: Diagnosis not present

## 2019-11-09 DIAGNOSIS — N179 Acute kidney failure, unspecified: Secondary | ICD-10-CM | POA: Diagnosis not present

## 2019-11-09 DIAGNOSIS — H547 Unspecified visual loss: Secondary | ICD-10-CM | POA: Diagnosis not present

## 2019-11-09 DIAGNOSIS — I6782 Cerebral ischemia: Secondary | ICD-10-CM | POA: Diagnosis not present

## 2019-11-09 DIAGNOSIS — G459 Transient cerebral ischemic attack, unspecified: Secondary | ICD-10-CM

## 2019-11-09 LAB — LIPID PANEL
Cholesterol: 150 mg/dL (ref 0–200)
HDL: 47 mg/dL (ref >40)
LDL Cholesterol: 88 mg/dL (ref 0–99)
Total CHOL/HDL Ratio: 3.2 RATIO
Triglycerides: 77 mg/dL (ref <150)
VLDL: 15 mg/dL (ref 0–40)

## 2019-11-09 LAB — BASIC METABOLIC PANEL
Anion gap: 11 (ref 5–15)
BUN: 29 mg/dL — ABNORMAL HIGH (ref 8–23)
CO2: 25 mmol/L (ref 22–32)
Calcium: 8.7 mg/dL — ABNORMAL LOW (ref 8.9–10.3)
Chloride: 105 mmol/L (ref 98–111)
Creatinine, Ser: 2.4 mg/dL — ABNORMAL HIGH (ref 0.61–1.24)
GFR calc Af Amer: 32 mL/min — ABNORMAL LOW (ref >60)
GFR calc non Af Amer: 28 mL/min — ABNORMAL LOW (ref >60)
Glucose, Bld: 99 mg/dL (ref 70–99)
Potassium: 4.7 mmol/L (ref 3.5–5.1)
Sodium: 141 mmol/L (ref 135–145)

## 2019-11-09 LAB — CREATININE, SERUM
Creatinine, Ser: 2.41 mg/dL — ABNORMAL HIGH (ref 0.61–1.24)
GFR calc Af Amer: 32 mL/min — ABNORMAL LOW (ref >60)
GFR calc non Af Amer: 28 mL/min — ABNORMAL LOW (ref >60)

## 2019-11-09 LAB — ECHOCARDIOGRAM COMPLETE
Area-P 1/2: 3.83 cm2
Height: 76 in
S' Lateral: 2.9 cm
Weight: 2800 oz

## 2019-11-09 LAB — HEMOGLOBIN A1C
Hgb A1c MFr Bld: 7.3 % — ABNORMAL HIGH (ref 4.8–5.6)
Mean Plasma Glucose: 162.81 mg/dL

## 2019-11-09 LAB — CBC
HCT: 27.1 % — ABNORMAL LOW (ref 39.0–52.0)
Hemoglobin: 9.1 g/dL — ABNORMAL LOW (ref 13.0–17.0)
MCH: 35.4 pg — ABNORMAL HIGH (ref 26.0–34.0)
MCHC: 33.6 g/dL (ref 30.0–36.0)
MCV: 105.4 fL — ABNORMAL HIGH (ref 80.0–100.0)
Platelets: 121 10*3/uL — ABNORMAL LOW (ref 150–400)
RBC: 2.57 MIL/uL — ABNORMAL LOW (ref 4.22–5.81)
RDW: 15.7 % — ABNORMAL HIGH (ref 11.5–15.5)
WBC: 2.5 10*3/uL — ABNORMAL LOW (ref 4.0–10.5)
nRBC: 0 % (ref 0.0–0.2)

## 2019-11-09 LAB — VITAMIN B12: Vitamin B-12: 193 pg/mL (ref 180–914)

## 2019-11-09 LAB — URINE CULTURE: Culture: <10000 — AB

## 2019-11-09 LAB — LACTIC ACID, PLASMA
Lactic Acid, Venous: 0.9 mmol/L (ref 0.5–1.9)
Lactic Acid, Venous: 1 mmol/L (ref 0.5–1.9)

## 2019-11-09 LAB — C-REACTIVE PROTEIN: CRP: 0.6 mg/dL (ref <1.0)

## 2019-11-09 LAB — TSH
TSH: 0.721 u[IU]/mL (ref 0.350–4.500)
TSH: 0.491 u[IU]/mL (ref 0.350–4.500)

## 2019-11-09 LAB — FERRITIN: Ferritin: 694 ng/mL — ABNORMAL HIGH (ref 24–336)

## 2019-11-09 LAB — TYPE AND SCREEN
ABO/RH(D): O POS
Antibody Screen: NEGATIVE

## 2019-11-09 LAB — IRON AND TIBC
Iron: 152 ug/dL (ref 45–182)
Saturation Ratios: 57 % — ABNORMAL HIGH (ref 17.9–39.5)
TIBC: 267 ug/dL (ref 250–450)
UIBC: 115 ug/dL

## 2019-11-09 LAB — AMMONIA: Ammonia: 20 umol/L (ref 9–35)

## 2019-11-09 LAB — HIV ANTIBODY (ROUTINE TESTING W REFLEX): HIV Screen 4th Generation wRfx: NONREACTIVE

## 2019-11-09 LAB — FOLATE: Folate: 14 ng/mL (ref >5.9)

## 2019-11-09 LAB — SEDIMENTATION RATE: Sed Rate: 12 mm/hr (ref 0–16)

## 2019-11-09 MED ORDER — ACETAMINOPHEN 160 MG/5ML PO SOLN: 650.0000 mg | ORAL | Status: DC | PRN

## 2019-11-09 MED ORDER — ATORVASTATIN CALCIUM 40 MG PO TABS
40.0000 mg | ORAL_TABLET | Freq: Every day | ORAL | Status: DC
  Administered 2019-11-09: 40 mg via ORAL
  Filled 2019-11-09: qty 1

## 2019-11-09 MED ORDER — ACETAMINOPHEN 325 MG PO TABS: 650.0000 mg | ORAL_TABLET | ORAL | Status: DC | PRN

## 2019-11-09 MED ORDER — STROKE: EARLY STAGES OF RECOVERY BOOK
Freq: Once | Status: DC
  Filled 2019-11-09: qty 1

## 2019-11-09 MED ORDER — ALBUTEROL SULFATE HFA 108 (90 BASE) MCG/ACT IN AERS: 2.0000 | INHALATION_SPRAY | Freq: Four times a day (QID) | RESPIRATORY_TRACT | Status: DC | PRN

## 2019-11-09 MED ORDER — ASPIRIN 325 MG PO TABS
325.0000 mg | ORAL_TABLET | Freq: Every day | ORAL | Status: DC
  Administered 2019-11-09: 325 mg via ORAL
  Filled 2019-11-09: qty 1

## 2019-11-09 MED ORDER — FENOFIBRATE 160 MG PO TABS
160.0000 mg | ORAL_TABLET | Freq: Every day | ORAL | Status: DC
  Administered 2019-11-09: 160 mg via ORAL
  Filled 2019-11-09: qty 1

## 2019-11-09 MED ORDER — CARVEDILOL 3.125 MG PO TABS: 3.1250 mg | ORAL_TABLET | Freq: Two times a day (BID) | ORAL | 1 refills | Status: DC

## 2019-11-09 MED ORDER — ENOXAPARIN SODIUM 30 MG/0.3ML ~~LOC~~ SOLN: 30.0000 mg | SUBCUTANEOUS | Status: DC

## 2019-11-09 MED ORDER — GADOBUTROL 1 MMOL/ML IV SOLN
8.0000 mL | Freq: Once | INTRAVENOUS | Status: AC | PRN
  Administered 2019-11-09: 8 mL via INTRAVENOUS

## 2019-11-09 MED ORDER — ATORVASTATIN CALCIUM 10 MG PO TABS: 20.0000 mg | ORAL_TABLET | Freq: Every day | ORAL | Status: DC

## 2019-11-09 MED ORDER — ASPIRIN 300 MG RE SUPP
300.0000 mg | Freq: Every day | RECTAL | Status: DC
  Filled 2019-11-09: qty 1

## 2019-11-09 MED ORDER — SACUBITRIL-VALSARTAN 97-103 MG PO TABS
1.0000 | ORAL_TABLET | Freq: Two times a day (BID) | ORAL | Status: DC
  Administered 2019-11-09: 1 via ORAL
  Filled 2019-11-09: qty 1

## 2019-11-09 MED ORDER — ENOXAPARIN SODIUM 40 MG/0.4ML ~~LOC~~ SOLN
40.0000 mg | SUBCUTANEOUS | Status: DC
  Administered 2019-11-09: 40 mg via SUBCUTANEOUS
  Filled 2019-11-09: qty 0.4

## 2019-11-09 MED ORDER — ENOXAPARIN SODIUM 40 MG/0.4ML ~~LOC~~ SOLN: 40.0000 mg | SUBCUTANEOUS | Status: DC

## 2019-11-09 MED ORDER — ACETAMINOPHEN 650 MG RE SUPP: 650.0000 mg | RECTAL | Status: DC | PRN

## 2019-11-09 NOTE — Progress Notes (Signed)
Carotid artery duplex has been completed. Preliminary results can be found in CV Proc through chart review.   11/09/19 2:04 PM Travis [name] RVT

## 2019-11-09 NOTE — Care Management Obs Status (Signed)
[facility] NOTIFICATION   Patient Details  Name: NICCOLAS LOEPER MRN: [id number] Date of Birth: 08-13-1955   Medicare Observation Status Notification Given:  Yes    Erenest Rasher, RN 11/09/2019, 3:37 PM

## 2019-11-09 NOTE — ED Notes (Signed)
ECHO at bedside.

## 2019-11-09 NOTE — Progress Notes (Signed)
OT Cancellation Note  Patient Details Name: Travis [name] MRN: [id number] DOB: 1956-02-01   Cancelled Treatment:    Reason Eval/Treat Not Completed: OT screened, no needs identified, will sign off. Chat text from PT that pt is independent and pt reports his vision is back to his normal--no OT needs were identified by PT.   [place], OTR/L Acute Rehab Services Pager [phone] Office [phone]     Almon Register 11/09/2019, 11:37 AM

## 2019-11-09 NOTE — Discharge Summary (Signed)
Physician Discharge Summary  Travis [name] MVH:[id number] DOB: 12/23/1955 DOA: 11/08/2019  PCP: [name], NP  Admit date: 11/08/2019 Discharge date: 11/09/2019  Admitted From: Home Disposition:  Home  Discharge Condition:Stable CODE STATUS:FULL Diet recommendation: Heart Healthy   Brief/Interim Summary:  Travis [name]  is a 64 y.o. 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  Patient says that he had binocular vision loss for about 10 to 15 minutes where everything became white.  He was unable to see anything.  When he went back home his vision returned.  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  And he complained of left-sided eye pain since this morning.  He denies headache or neck pain.  Denies weakness of extremities.  Denies slurred speech.  No history of seizures.  He denies passing out during the episode.  He was alert throughout the episode.  He denies chest pain or shortness of breath.  Denies any palpitations. Patient did have 2-3 episodes of vomiting.  Denies any blood in the vomitus.  No history of melena.  He was admitted for work-up of TIA/stroke.  Hospital course:  Patient's hospital course remained stable.  He did not have any focal neurplogical deficits after being admitted.  His vision completely returned to baseline.  CT head did not show any acute intracranial abnormalities. MRI did not show any stroke or any other alarming findings.  Carotid Doppler did not show any significant carotid stenosis.Echo did not show any intracardiac source of emboli.  He is hemodynamically stable for discharge to home today.  PT/OT did not order recommend any follow-up.  Following problems were addressed during his  hospitalization:   1. Transient visual loss-unclear etiology, patient has history of left central retinal artery occlusion.  He is back to baseline.  Ophthalmology was consulted by ED provider, Dr. Posey Pronto recommends patient to be seen as outpatient after discharge.  Tele neurology was consulted and recommended TIA work-up.    Stroke work-up as above and found to be largely negative.  2. Anemia-patient hemoglobin is in the range of  9 today.  Previous hemoglobin was 12.9 as of Aug 11, 2019.  Unclear etiology.  Negative anemia panel,negative  stool for occult blood.  No history of blood per rectum or black stool.  Monitor as an outpatient.  3. Acute kidney injury on CKD stage III-patient baseline creatinine around 2-2.4.   Presenting with creatinine of 3.28.  Patient takes Entresto, Aldactone for nonischemic cardiomyopathy.  Started on gentle IV hydration with improvement.  4. Nonischemic cardiomyopathy-patient has nonobstructive CAD, continue Entresto, Coreg, Aldactone.    We have reduced the dose of Coreg because his blood pressure is soft.  We recommend to follow-up with his cardiologist as an outpatient.    5.Hyperlipidemia-continue statin, fibrate   Discharge Diagnoses:  Active Problems:   TIA (transient ischemic attack)    Discharge Instructions  Discharge Instructions    Diet - low sodium heart healthy   Complete by: As directed    Discharge instructions   Complete by: As directed    1)Please follow-up with your PCP in a week.Do a BMP test to check your kidney function  and CBC during the follow-up. 2)Monitor your blood pressure is low.  Your blood pressure is on  the lower side.  We have decreased the dose of carvedilol. 3)Follow up with ophthalmology in a week.  Name and number the provider has been attached. Please call for appointment   Increase activity slowly   Complete by: As directed      Allergies as of 11/09/2019   No Known Allergies     Medication List    TAKE  these medications   albuterol 108 (90 Base) MCG/ACT inhaler Commonly known as: VENTOLIN HFA Inhale 2 puffs into the lungs every 6 (six) hours as needed for wheezing or shortness of breath. Take 2 puffs every 5-10 minutes up to 6 puffs total over 15 minutes when needed.  Use with a spacer.   allopurinol 100 MG tablet Commonly known as: ZYLOPRIM Take 2 tablets (200 mg total) by mouth daily.   aspirin EC 325 MG tablet Take 162.5 mg by mouth daily.   atorvastatin 20 MG tablet Commonly known as: LIPITOR Take 1 tablet (20 mg total) by mouth daily.   baclofen 10 MG tablet Commonly known as: LIORESAL Take 1 tablet (10 mg total) by mouth 3 (three) times daily as needed for muscle spasms (sedation caution). What changed: reasons to take this   carvedilol 3.125 MG tablet Commonly known as: COREG Take 1 tablet (3.125 mg total) by mouth 2 (two) times daily with a meal. What changed:   medication strength  how much to take   celecoxib 100 MG capsule Commonly known as: CeleBREX Take 1 capsule (100 mg total) by mouth 2 (two) times daily.   cetirizine 10 MG tablet Commonly known as: ZYRTEC TAKE 1 TABLET BY MOUTH DAILY   cyclobenzaprine 10 MG tablet Commonly known as: FLEXERIL Take 1 tablet (10 mg total) by mouth 3 (three) times daily as needed for muscle spasms.   Entresto 97-103 MG Generic drug: sacubitril-valsartan TAKE 1 TABLET BY MOUTH TWICE (2) DAILY What changed: See the new instructions.   fenofibrate 145 MG tablet Commonly known as: TRICOR TAKE 1 TABLET BY MOUTH ONCE A DAY   FISH OIL PO Take 1 capsule by mouth daily.   fluticasone 50 MCG/ACT nasal spray Commonly known as: Flonase Place 1 spray into both nostrils 2 (two) times daily. What changed:   when to take this  reasons to take this   folic acid 1 MG tablet Commonly known as: FOLVITE Take 1 tablet (1 mg total) by mouth daily. What changed: how much to take   lidocaine 5 % Commonly known as:  Lidoderm Place 1 patch onto the skin daily. Remove & Discard patch within 12 hours or as directed by MD What changed:   when to take this  reasons to take this   multivitamin with minerals Tabs tablet Take 1 tablet by mouth daily. Men's One a Day   spironolactone 25 MG tablet Commonly known as: ALDACTONE Take 0.5 tablets (12.5 mg total) by mouth daily.   triamcinolone cream 0.1 % Commonly known as: KENALOG APPLY TO AFFECTED AREAS TWICE A DAY AS NEEDED. AVOID FACE,GROIN, OR UNDERARMS What changed: See the new instructions.       Follow-up Information    [name], NP. Schedule an appointment as soon as possible for a visit in 1 week(s).   Specialties: Internal Medicine, Emergency Medicine Contact information: [place] [address] [phone]        Jalene Mullet, MD. Schedule an appointment as soon as possible for a visit in 1 week(s).   Specialty: Ophthalmology Contact  information: [address] [place] [address] [phone]              No Known Allergies  Consultations:  None   Procedures/Studies: CT Head Wo Contrast  Result Date: 11/08/2019 CLINICAL DATA:  Transient ischemic attack (TIA) Patient reports acute onset of nausea, vomiting and vision changes. EXAM: CT HEAD WITHOUT CONTRAST TECHNIQUE: Contiguous axial images were obtained from the base of the skull through the vertex without intravenous contrast. COMPARISON:  Head CT 07/26/2019 FINDINGS: Brain: Stable degree of atrophy and chronic small vessel ischemia. No intracranial hemorrhage, mass effect, or midline shift. No hydrocephalus. The basilar cisterns are patent. No evidence of territorial infarct or acute ischemia. No extra-axial or intracranial fluid collection. Vascular: No hyperdense vessel. Skull: No fracture or focal lesion. Sinuses/Orbits: Paranasal sinuses and mastoid air cells are clear. The visualized orbits are unremarkable. Other: None.  IMPRESSION: 1. No acute intracranial abnormality. 2. Stable atrophy and chronic small vessel ischemia. Electronically Signed   By: Keith Rake M.D.   On: 11/08/2019 19:24   MR BRAIN W WO CONTRAST  Result Date: 11/09/2019 CLINICAL DATA:  Episode of binocular vision loss EXAM: MRI HEAD WITHOUT AND WITH CONTRAST TECHNIQUE: Multiplanar, multiecho pulse sequences of the brain and surrounding structures were obtained without and with intravenous contrast. CONTRAST:  89mL GADAVIST GADOBUTROL 1 MMOL/ML IV SOLN COMPARISON:  2015 FINDINGS: Brain: There is no acute infarction or intracranial hemorrhage. There is no intracranial mass, mass effect, or edema. There is no hydrocephalus or extra-axial fluid collection. Prominence of the ventricles and sulci reflects mild generalized parenchymal volume loss, with some progression since 2015. Patchy T2 hyperintensity in the supratentorial white matter is nonspecific but probably reflects mild chronic microvascular ischemic changes similar to prior study. No abnormal enhancement. Vascular: Major vessel flow voids at the skull base are preserved. Skull and upper cervical spine: Normal marrow signal is preserved. Sinuses/Orbits: Minor mucosal thickening.  Orbits are unremarkable. Other: Sella is unremarkable. Trace right mastoid fluid opacification. IMPRESSION: No evidence of recent infarction, hemorrhage, mass, or abnormal enhancement. Stable chronic microvascular ischemic changes. Electronically Signed   By: Macy Mis M.D.   On: 11/09/2019 10:17   DG Chest Port 1 View  Result Date: 11/08/2019 CLINICAL DATA:  Vomiting. EXAM: PORTABLE CHEST 1 VIEW COMPARISON:  Jul 26, 2019 FINDINGS: There is no evidence of acute infiltrate, pleural effusion or pneumothorax. The heart size and mediastinal contours are within normal limits. An intact right shoulder replacement is seen. The visualized skeletal structures are otherwise unremarkable. IMPRESSION: No active disease.  Electronically Signed   By: Virgina Norfolk M.D.   On: 11/08/2019 16:13   ECHOCARDIOGRAM COMPLETE  Result Date: 11/09/2019    ECHOCARDIOGRAM REPORT   Patient Name:   Travis [name] Kirker Date of Exam: 11/09/2019 Medical Rec #:  [id number]     Height:       76.0 in Accession #:    [id number]    Weight:       175.0 lb Date of Birth:  11/03/1955    BSA:          2.093 m Patient Age:    18 years      BP:           115/75 mmHg Patient Gender: M             HR:           74 bpm. Exam Location:  Inpatient Procedure: 2D Echo Indications:  TIA 435.9  History:        Patient has prior history of Echocardiogram examinations, most                 recent 06/04/2019. Cardiomyopathy, CAD, TIA; Risk                 Factors:Diabetes, Hypertension, Dyslipidemia and Current Smoker.                 ETOH Abuse. Alcoholic Cardiomyopathy. tobacco abuse.  Sonographer:    Jannett Celestine RDCS (AE) Referring Phys: Elmo  1. Compared to echo done in March 2021 EF improved and thickening of MV and subchordal apparatus more prominent.  2. Left ventricular ejection fraction, by estimation, is 45 to 50%. The left ventricle has mildly decreased function. The left ventricle demonstrates regional wall motion abnormalities (see scoring diagram/findings for description). There is mild left ventricular hypertrophy. Left ventricular diastolic parameters are indeterminate.  3. Enlarged basal annulus . Right ventricular systolic function is mildly reduced. The right ventricular size is mildly enlarged.  4. Right atrial size was mildly dilated.  5. MV is thickened cannot r/o SBE but likely reducndant sub chordal apparatus consider TEE to further evaluate if suspicion of SBE high . The mitral valve is abnormal. Trivial mitral valve regurgitation. No evidence of mitral stenosis.  6. The aortic valve was not well visualized. Aortic valve regurgitation is not visualized. Mild to moderate aortic valve sclerosis/calcification is present,  without any evidence of aortic stenosis.  7. The inferior vena cava is normal in size with greater than 50% respiratory variability, suggesting right atrial pressure of 3 mmHg. FINDINGS  Left Ventricle: Left ventricular ejection fraction, by estimation, is 45 to 50%. The left ventricle has mildly decreased function. The left ventricle demonstrates regional wall motion abnormalities. The left ventricular internal cavity size was normal in size. There is mild left ventricular hypertrophy. Left ventricular diastolic parameters are indeterminate. Right Ventricle: Enlarged basal annulus. The right ventricular size is mildly enlarged. Right vetricular wall thickness was not assessed. Right ventricular systolic function is mildly reduced. Left Atrium: Left atrial size was normal in size. Right Atrium: Right atrial size was mildly dilated. Pericardium: There is no evidence of pericardial effusion. Mitral Valve: MV is thickened cannot r/o SBE but likely reducndant sub chordal apparatus consider TEE to further evaluate if suspicion of SBE high. The mitral valve is abnormal. There is moderate thickening of the mitral valve leaflet(s). There is mild calcification of the mitral valve leaflet(s). Normal mobility of the mitral valve leaflets. Trivial mitral valve regurgitation. No evidence of mitral valve stenosis. Tricuspid Valve: The tricuspid valve is normal in structure. Tricuspid valve regurgitation is mild . No evidence of tricuspid stenosis. Aortic Valve: The aortic valve was not well visualized. Aortic valve regurgitation is not visualized. Mild to moderate aortic valve sclerosis/calcification is present, without any evidence of aortic stenosis. Pulmonic Valve: The pulmonic valve was normal in structure. Pulmonic valve regurgitation is not visualized. No evidence of pulmonic stenosis. Aorta: The aortic root is normal in size and structure. Venous: The inferior vena cava is normal in size with greater than 50% respiratory  variability, suggesting right atrial pressure of 3 mmHg. IAS/Shunts: No atrial level shunt detected by color flow Doppler. Additional Comments: Compared to echo done in March 2021 EF improved and thickening of MV and subchordal apparatus more prominent.  LEFT VENTRICLE PLAX 2D LVIDd:         4.30 cm  Diastology LVIDs:         2.90 cm  LV e' lateral:   11.40 cm/s LV PW:         1.20 cm  LV E/e' lateral: 5.5 LV IVS:        1.20 cm  LV e' medial:    5.66 cm/s LVOT diam:     2.20 cm  LV E/e' medial:  11.1 LV SV:         57 LV SV Index:   27 LVOT Area:     3.80 cm  RIGHT VENTRICLE RV S prime:     12.50 cm/s TAPSE (M-mode): 1.9 cm LEFT ATRIUM           Index       RIGHT ATRIUM           Index LA diam:      2.70 cm 1.29 cm/m  RA Area:     17.10 cm LA Vol (A2C): 36.1 ml 17.25 ml/m RA Volume:   44.00 ml  21.02 ml/m  AORTIC VALVE LVOT Vmax:   81.60 cm/s LVOT Vmean:  63.500 cm/s LVOT VTI:    0.151 m  AORTA Ao Root diam: 3.00 cm MITRAL VALVE MV Area (PHT): 3.83 cm    SHUNTS MV Decel Time: 198 msec    Systemic VTI:  0.15 m MV E velocity: 63.00 cm/s  Systemic Diam: 2.20 cm MV A velocity: 67.70 cm/s MV E/A ratio:  0.93 Travis Rouge MD Electronically signed by Travis Rouge MD Signature Date/Time: 11/09/2019/12:18:29 PM    Final    VAS US CAROTID (at [facility] and [facility] only)  Result Date: 11/09/2019 Carotid Arterial Duplex Study Indications:       TIA. Comparison Study:  No prior studies. Performing Technologist: Oliver Hum RVT  Examination Guidelines: A complete evaluation includes B-mode imaging, spectral Doppler, color Doppler, and power Doppler as needed of all accessible portions of each vessel. Bilateral testing is considered an integral part of a complete examination. Limited examinations for reoccurring indications may be performed as noted.  Right Carotid Findings: +----------+--------+--------+--------+-----------------------+--------+           PSV cm/sEDV cm/sStenosisPlaque Description     Comments  +----------+--------+--------+--------+-----------------------+--------+ CCA Prox  78      18              smooth and heterogenoustortuous +----------+--------+--------+--------+-----------------------+--------+ CCA Distal46      18              smooth and heterogenous         +----------+--------+--------+--------+-----------------------+--------+ ICA Prox  44      10              smooth and heterogenous         +----------+--------+--------+--------+-----------------------+--------+ ICA Mid                                                  tortuous +----------+--------+--------+--------+-----------------------+--------+ ICA Distal92      40                                     tortuous +----------+--------+--------+--------+-----------------------+--------+ ECA       47      10                                              +----------+--------+--------+--------+-----------------------+--------+ +----------+--------+-------+--------+-------------------+  PSV cm/sEDV cmsDescribeArm Pressure (mmHG) +----------+--------+-------+--------+-------------------+ [id number]                                         +----------+--------+-------+--------+-------------------+ +---------+--------+--+--------+--+---------+ VertebralPSV cm/s26EDV cm/s10Antegrade +---------+--------+--+--------+--+---------+  Left Carotid Findings: +----------+--------+--------+--------+-----------------------+--------+           PSV cm/sEDV cm/sStenosisPlaque Description     Comments +----------+--------+--------+--------+-----------------------+--------+ CCA Prox  91      24              smooth and heterogenous         +----------+--------+--------+--------+-----------------------+--------+ CCA Distal64      14              smooth and heterogenous         +----------+--------+--------+--------+-----------------------+--------+ ICA Prox  61      17               smooth and heterogenoustortuous +----------+--------+--------+--------+-----------------------+--------+ ICA Distal73      31                                     tortuous +----------+--------+--------+--------+-----------------------+--------+ ECA       56      12                                              +----------+--------+--------+--------+-----------------------+--------+ +----------+--------+--------+--------+-------------------+           PSV cm/sEDV cm/sDescribeArm Pressure (mmHG) +----------+--------+--------+--------+-------------------+ [id number]                                          +----------+--------+--------+--------+-------------------+ +---------+--------+--+--------+-+---------+ VertebralPSV cm/s36EDV cm/s8Antegrade +---------+--------+--+--------+-+---------+   Summary: Right Carotid: Velocities in the right ICA are consistent with a 1-39% stenosis. Left Carotid: Velocities in the left ICA are consistent with a 1-39% stenosis. Vertebrals: Bilateral vertebral arteries demonstrate antegrade flow. *See table(s) above for measurements and observations.  Electronically signed by Antony Contras MD on 11/09/2019 at 3:09:41 PM.    Final       Subjective: Patient seen and examined the bedside this morning.  Hemodynamically stable for discharge today.  Discharge Exam: Vitals:   11/09/19 1500 11/09/19 1508  BP: 96/68 96/68  Pulse: 92 90  Resp: (!) 7 16  Temp:    SpO2: 99% 99%   Vitals:   11/09/19 1340 11/09/19 1424 11/09/19 1500 11/09/19 1508  BP: 106/67 105/78 96/68 96/68   Pulse: 90 90 92 90  Resp: 19 14 (!) 7 16  Temp:      TempSrc:      SpO2: 100% 100% 99% 99%  Weight:      Height:        General: Pt is alert, awake, not in acute distress Cardiovascular: RRR, S1/S2 +, no rubs, no gallops Respiratory: CTA bilaterally, no wheezing, no rhonchi Abdominal: Soft, NT, ND, bowel sounds + Extremities: no edema, no cyanosis    The  results of significant diagnostics from this hospitalization (including imaging, microbiology, ancillary and laboratory) are listed below for reference.     Microbiology: Recent Results (from the past 240 hour(s))  SARS Coronavirus 2 by RT PCR (hospital  order, performed in [facility] hospital lab) Nasopharyngeal Nasopharyngeal Swab     Status: None   Collection Time: 11/08/19  4:14 PM   Specimen: Nasopharyngeal Swab  Result Value Ref Range Status   SARS Coronavirus 2 NEGATIVE NEGATIVE Final    Comment: (NOTE) SARS-CoV-2 target nucleic acids are NOT DETECTED.  The SARS-CoV-2 RNA is generally detectable in upper and lower respiratory specimens during the acute phase of infection. The lowest concentration of SARS-CoV-2 viral copies this assay can detect is 250 copies / mL. A negative result does not preclude SARS-CoV-2 infection and should not be used as the sole basis for treatment or other patient management decisions.  A negative result may occur with improper specimen collection / handling, submission of specimen other than nasopharyngeal swab, presence of viral mutation(s) within the areas targeted by this assay, and inadequate number of viral copies (<250 copies / mL). A negative result must be combined with clinical observations, patient history, and epidemiological information.  Fact Sheet for Patients:   StrictlyIdeas.no  Fact Sheet for Healthcare Providers: BankingDealers.co.za  This test is not yet approved or  cleared by the [place] FDA and has been authorized for detection and/or diagnosis of SARS-CoV-2 by FDA under an Emergency Use Authorization (EUA).  This EUA will remain in effect (meaning this test can be used) for the duration of the COVID-19 declaration under Section 564(b)(1) of the Act, 21 U.S.C. section 360bbb-3(b)(1), unless the authorization is terminated or revoked sooner.  Performed at [facility], [place] [address], [place], [place] [address]      Labs: BNP (last 3 results) No results for input(s): BNP in the last 8760 hours. Basic Metabolic Panel: Recent Labs  Lab 11/08/19 1453 11/09/19 0910 11/09/19 0911  NA 139 141  --   K 4.4 4.7  --   CL 106 105  --   CO2 22 25  --   GLUCOSE 111* 99  --   BUN 32* 29*  --   CREATININE 3.43* 2.40* 2.41*  CALCIUM 9.0 8.7*  --    Liver Function Tests: Recent Labs  Lab 11/08/19 1453  AST 25  ALT 16  ALKPHOS 30*  BILITOT 0.5  PROT 5.9*  ALBUMIN 3.2*   Recent Labs  Lab 11/08/19 1453  LIPASE 32   Recent Labs  Lab 11/09/19 0956  AMMONIA 20   CBC: Recent Labs  Lab 11/08/19 1453 11/09/19 0911  WBC 3.4* 2.5*  NEUTROABS 2.4  --   HGB 8.9* 9.1*  HCT 26.5* 27.1*  MCV 104.3* 105.4*  PLT 134* 121*   Cardiac Enzymes: Recent Labs  Lab 11/08/19 1527  CKTOTAL 41*   BNP: Invalid input(s): POCBNP CBG: No results for input(s): GLUCAP in the last 168 hours. D-Dimer No results for input(s): DDIMER in the last 72 hours. Hgb A1c Recent Labs    11/09/19 0911  HGBA1C 7.3*   Lipid Profile Recent Labs    11/09/19 0911  CHOL 150  HDL 47  LDLCALC 88  TRIG 77  CHOLHDL 3.2   Thyroid function studies Recent Labs    11/09/19 0956  TSH 0.491   Anemia work up Recent Labs    11/08/19 1442 11/09/19 0035  VITAMINB12  --  193  FOLATE  --  14.0  FERRITIN  --  694*  TIBC  --  267  IRON  --  152  RETICCTPCT 1.6  --    Urinalysis    Component Value Date/Time   COLORURINE  AMBER (A) 11/08/2019 1900   APPEARANCEUR CLEAR 11/08/2019 1900   APPEARANCEUR Cloudy 11/19/2012 1107   LABSPEC 1.017 11/08/2019 1900   LABSPEC 1.024 11/19/2012 1107   PHURINE 5.0 11/08/2019 1900   GLUCOSEU NEGATIVE 11/08/2019 1900   GLUCOSEU Negative 11/19/2012 1107   HGBUR NEGATIVE 11/08/2019 1900   BILIRUBINUR NEGATIVE 11/08/2019 1900   BILIRUBINUR Negative 11/19/2012 1107   KETONESUR NEGATIVE 11/08/2019 1900    PROTEINUR NEGATIVE 11/08/2019 1900   UROBILINOGEN 0.2 09/14/2013 1225   NITRITE NEGATIVE 11/08/2019 1900   LEUKOCYTESUR NEGATIVE 11/08/2019 1900   LEUKOCYTESUR Negative 11/19/2012 1107   Sepsis Labs Invalid input(s): PROCALCITONIN,  WBC,  LACTICIDVEN Microbiology Recent Results (from the past 240 hour(s))  SARS Coronavirus 2 by RT PCR (hospital order, performed in [facility] hospital lab) Nasopharyngeal Nasopharyngeal Swab     Status: None   Collection Time: 11/08/19  4:14 PM   Specimen: Nasopharyngeal Swab  Result Value Ref Range Status   SARS Coronavirus 2 NEGATIVE NEGATIVE Final    Comment: (NOTE) SARS-CoV-2 target nucleic acids are NOT DETECTED.  The SARS-CoV-2 RNA is generally detectable in upper and lower respiratory specimens during the acute phase of infection. The lowest concentration of SARS-CoV-2 viral copies this assay can detect is 250 copies / mL. A negative result does not preclude SARS-CoV-2 infection and should not be used as the sole basis for treatment or other patient management decisions.  A negative result may occur with improper specimen collection / handling, submission of specimen other than nasopharyngeal swab, presence of viral mutation(s) within the areas targeted by this assay, and inadequate number of viral copies (<250 copies / mL). A negative result must be combined with clinical observations, patient history, and epidemiological information.  Fact Sheet for Patients:   StrictlyIdeas.no  Fact Sheet for Healthcare Providers: BankingDealers.co.za  This test is not yet approved or  cleared by the [place] FDA and has been authorized for detection and/or diagnosis of SARS-CoV-2 by FDA under an Emergency Use Authorization (EUA).  This EUA will remain in effect (meaning this test can be used) for the duration of the COVID-19 declaration under Section 564(b)(1) of the Act, 21 U.S.C. section  360bbb-3(b)(1), unless the authorization is terminated or revoked sooner.  Performed at [facility], [facility] [place] [address], [place], [place] [address]     Please note: You were cared for by a hospitalist during your hospital stay. Once you are discharged, your primary care physician will handle any further medical issues. Please note that NO REFILLS for any discharge medications will be authorized once you are discharged, as it is imperative that you return to your primary care physician (or establish a relationship with a primary care physician if you do not have one) for your post hospital discharge needs so that they can reassess your need for medications and monitor your lab values.    Time coordinating discharge: 40 minutes  SIGNED:   Shelly Coss, MD  [facility] 11/09/2019, 3:35 PM Pager [phone]  If 7PM-7AM, please contact night-coverage [email or web address] Password [email or web address]

## 2019-11-09 NOTE — ED Notes (Signed)
Discussed discharge instructions and medications with patient. IV removed. All questions addressed. Patient transported home via car by his friend.

## 2019-11-09 NOTE — Care Management CC44 (Signed)
Condition Code 44 Documentation Completed  Patient Details  Name: Travis [name] MRN: [id number] Date of Birth: 07-Apr-1955   Condition Code 44 given:  Yes Patient signature on Condition Code 44 notice:  Yes Documentation of 2 MD's agreement:  Yes Code 44 added to claim:  Yes    Erenest Rasher, RN 11/09/2019, 3:37 PM
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
## 2019-11-09 NOTE — Progress Notes (Signed)
  Echocardiogram 2D Echocardiogram has been performed.  Travis [name] 11/09/2019, 11:35 AM

## 2019-11-17 ENCOUNTER — Ambulatory Visit: Payer: Medicare Other | Attending: Family | Admitting: Family

## 2019-11-17 ENCOUNTER — Other Ambulatory Visit: Payer: Self-pay

## 2019-11-17 ENCOUNTER — Encounter: Payer: Self-pay | Admitting: Family

## 2019-11-17 VITALS — BP 85/52 | HR 90 | Resp 20 | Ht 76.0 in | Wt 172.1 lb

## 2019-11-17 DIAGNOSIS — Z789 Other specified health status: Secondary | ICD-10-CM

## 2019-11-17 DIAGNOSIS — Z96611 Presence of right artificial shoulder joint: Secondary | ICD-10-CM | POA: Diagnosis not present

## 2019-11-17 DIAGNOSIS — F17210 Nicotine dependence, cigarettes, uncomplicated: Secondary | ICD-10-CM | POA: Diagnosis not present

## 2019-11-17 DIAGNOSIS — E785 Hyperlipidemia, unspecified: Secondary | ICD-10-CM | POA: Diagnosis not present

## 2019-11-17 DIAGNOSIS — M109 Gout, unspecified: Secondary | ICD-10-CM | POA: Diagnosis not present

## 2019-11-17 DIAGNOSIS — Z833 Family history of diabetes mellitus: Secondary | ICD-10-CM | POA: Diagnosis not present

## 2019-11-17 DIAGNOSIS — Z8673 Personal history of transient ischemic attack (TIA), and cerebral infarction without residual deficits: Secondary | ICD-10-CM | POA: Insufficient documentation

## 2019-11-17 DIAGNOSIS — I110 Hypertensive heart disease with heart failure: Secondary | ICD-10-CM | POA: Insufficient documentation

## 2019-11-17 DIAGNOSIS — Z7982 Long term (current) use of aspirin: Secondary | ICD-10-CM | POA: Diagnosis not present

## 2019-11-17 DIAGNOSIS — Z79899 Other long term (current) drug therapy: Secondary | ICD-10-CM | POA: Diagnosis not present

## 2019-11-17 DIAGNOSIS — M1990 Unspecified osteoarthritis, unspecified site: Secondary | ICD-10-CM | POA: Diagnosis not present

## 2019-11-17 DIAGNOSIS — I429 Cardiomyopathy, unspecified: Secondary | ICD-10-CM | POA: Insufficient documentation

## 2019-11-17 DIAGNOSIS — I10 Essential (primary) hypertension: Secondary | ICD-10-CM

## 2019-11-17 DIAGNOSIS — F1090 Alcohol use, unspecified, uncomplicated: Secondary | ICD-10-CM

## 2019-11-17 DIAGNOSIS — Z7289 Other problems related to lifestyle: Secondary | ICD-10-CM

## 2019-11-17 DIAGNOSIS — Z955 Presence of coronary angioplasty implant and graft: Secondary | ICD-10-CM | POA: Diagnosis not present

## 2019-11-17 DIAGNOSIS — J45909 Unspecified asthma, uncomplicated: Secondary | ICD-10-CM | POA: Diagnosis not present

## 2019-11-17 DIAGNOSIS — E119 Type 2 diabetes mellitus without complications: Secondary | ICD-10-CM | POA: Diagnosis not present

## 2019-11-17 DIAGNOSIS — I2510 Atherosclerotic heart disease of native coronary artery without angina pectoris: Secondary | ICD-10-CM | POA: Diagnosis not present

## 2019-11-17 DIAGNOSIS — I5022 Chronic systolic (congestive) heart failure: Secondary | ICD-10-CM

## 2019-11-17 DIAGNOSIS — Z791 Long term (current) use of non-steroidal anti-inflammatories (NSAID): Secondary | ICD-10-CM | POA: Diagnosis not present

## 2019-11-17 DIAGNOSIS — Z720 Tobacco use: Secondary | ICD-10-CM

## 2019-11-17 DIAGNOSIS — Z8249 Family history of ischemic heart disease and other diseases of the circulatory system: Secondary | ICD-10-CM | POA: Insufficient documentation

## 2019-11-17 NOTE — Patient Instructions (Addendum)
Resume weighing daily and call for an overnight weight gain of > 2 pounds or a weekly weight gain of >5 pounds.   Stop taking spironolactone for now.   Call me if you gain weight, notice swelling in your legs or develop shortness of breath.
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
## 2019-11-18 ENCOUNTER — Encounter: Payer: Self-pay | Admitting: Family

## 2019-11-20 ENCOUNTER — Inpatient Hospital Stay (HOSPITAL_COMMUNITY)
Admission: EM | Admit: 2019-11-20 | Discharge: 2019-11-26 | DRG: 315 | Disposition: A | Discharge status: Home / Self Care | Payer: Medicare Other | Attending: Internal Medicine | Admitting: Internal Medicine

## 2019-11-20 ENCOUNTER — Encounter (HOSPITAL_COMMUNITY): Payer: Self-pay

## 2019-11-20 ENCOUNTER — Telehealth: Payer: Self-pay

## 2019-11-20 ENCOUNTER — Other Ambulatory Visit: Payer: Self-pay

## 2019-11-20 ENCOUNTER — Ambulatory Visit (HOSPITAL_COMMUNITY)
Admission: EM | Admit: 2019-11-20 | Discharge: 2019-11-20 | Disposition: A | Discharge status: Other Acute Inpatient Hospital | Payer: Medicare Other | Attending: Family Medicine | Admitting: Family Medicine

## 2019-11-20 ENCOUNTER — Emergency Department (HOSPITAL_COMMUNITY): Payer: Medicare Other

## 2019-11-20 DIAGNOSIS — Z8249 Family history of ischemic heart disease and other diseases of the circulatory system: Secondary | ICD-10-CM | POA: Diagnosis not present

## 2019-11-20 DIAGNOSIS — K269 Duodenal ulcer, unspecified as acute or chronic, without hemorrhage or perforation: Secondary | ICD-10-CM | POA: Diagnosis present

## 2019-11-20 DIAGNOSIS — Z96611 Presence of right artificial shoulder joint: Secondary | ICD-10-CM | POA: Diagnosis present

## 2019-11-20 DIAGNOSIS — D649 Anemia, unspecified: Secondary | ICD-10-CM | POA: Diagnosis not present

## 2019-11-20 DIAGNOSIS — I4891 Unspecified atrial fibrillation: Secondary | ICD-10-CM | POA: Diagnosis present

## 2019-11-20 DIAGNOSIS — Z7982 Long term (current) use of aspirin: Secondary | ICD-10-CM

## 2019-11-20 DIAGNOSIS — D7589 Other specified diseases of blood and blood-forming organs: Secondary | ICD-10-CM

## 2019-11-20 DIAGNOSIS — E861 Hypovolemia: Secondary | ICD-10-CM | POA: Diagnosis not present

## 2019-11-20 DIAGNOSIS — R509 Fever, unspecified: Secondary | ICD-10-CM

## 2019-11-20 DIAGNOSIS — K2100 Gastro-esophageal reflux disease with esophagitis, without bleeding: Secondary | ICD-10-CM | POA: Diagnosis not present

## 2019-11-20 DIAGNOSIS — E782 Mixed hyperlipidemia: Secondary | ICD-10-CM

## 2019-11-20 DIAGNOSIS — K429 Umbilical hernia without obstruction or gangrene: Secondary | ICD-10-CM | POA: Diagnosis not present

## 2019-11-20 DIAGNOSIS — Z87891 Personal history of nicotine dependence: Secondary | ICD-10-CM | POA: Diagnosis present

## 2019-11-20 DIAGNOSIS — M1990 Unspecified osteoarthritis, unspecified site: Secondary | ICD-10-CM | POA: Diagnosis present

## 2019-11-20 DIAGNOSIS — I426 Alcoholic cardiomyopathy: Secondary | ICD-10-CM | POA: Diagnosis present

## 2019-11-20 DIAGNOSIS — Z720 Tobacco use: Secondary | ICD-10-CM

## 2019-11-20 DIAGNOSIS — J449 Chronic obstructive pulmonary disease, unspecified: Secondary | ICD-10-CM | POA: Diagnosis present

## 2019-11-20 DIAGNOSIS — F1021 Alcohol dependence, in remission: Secondary | ICD-10-CM | POA: Diagnosis present

## 2019-11-20 DIAGNOSIS — E785 Hyperlipidemia, unspecified: Secondary | ICD-10-CM | POA: Diagnosis present

## 2019-11-20 DIAGNOSIS — N179 Acute kidney failure, unspecified: Secondary | ICD-10-CM | POA: Diagnosis present

## 2019-11-20 DIAGNOSIS — M109 Gout, unspecified: Secondary | ICD-10-CM | POA: Diagnosis present

## 2019-11-20 DIAGNOSIS — I959 Hypotension, unspecified: Secondary | ICD-10-CM

## 2019-11-20 DIAGNOSIS — Z833 Family history of diabetes mellitus: Secondary | ICD-10-CM | POA: Diagnosis not present

## 2019-11-20 DIAGNOSIS — K5730 Diverticulosis of large intestine without perforation or abscess without bleeding: Secondary | ICD-10-CM | POA: Diagnosis not present

## 2019-11-20 DIAGNOSIS — Z79899 Other long term (current) drug therapy: Secondary | ICD-10-CM

## 2019-11-20 DIAGNOSIS — N1832 Chronic kidney disease, stage 3b: Secondary | ICD-10-CM | POA: Diagnosis present

## 2019-11-20 DIAGNOSIS — Z20822 Contact with and (suspected) exposure to covid-19: Secondary | ICD-10-CM | POA: Diagnosis present

## 2019-11-20 DIAGNOSIS — Z7289 Other problems related to lifestyle: Secondary | ICD-10-CM | POA: Diagnosis not present

## 2019-11-20 DIAGNOSIS — F17210 Nicotine dependence, cigarettes, uncomplicated: Secondary | ICD-10-CM | POA: Diagnosis present

## 2019-11-20 DIAGNOSIS — D631 Anemia in chronic kidney disease: Secondary | ICD-10-CM | POA: Diagnosis present

## 2019-11-20 DIAGNOSIS — Z8673 Personal history of transient ischemic attack (TIA), and cerebral infarction without residual deficits: Secondary | ICD-10-CM

## 2019-11-20 DIAGNOSIS — H04123 Dry eye syndrome of bilateral lacrimal glands: Secondary | ICD-10-CM | POA: Diagnosis not present

## 2019-11-20 DIAGNOSIS — E440 Moderate protein-calorie malnutrition: Secondary | ICD-10-CM | POA: Diagnosis present

## 2019-11-20 DIAGNOSIS — D500 Iron deficiency anemia secondary to blood loss (chronic): Secondary | ICD-10-CM | POA: Diagnosis not present

## 2019-11-20 DIAGNOSIS — I2510 Atherosclerotic heart disease of native coronary artery without angina pectoris: Secondary | ICD-10-CM | POA: Diagnosis not present

## 2019-11-20 DIAGNOSIS — A419 Sepsis, unspecified organism: Secondary | ICD-10-CM | POA: Diagnosis not present

## 2019-11-20 DIAGNOSIS — I9589 Other hypotension: Secondary | ICD-10-CM | POA: Diagnosis not present

## 2019-11-20 DIAGNOSIS — K2970 Gastritis, unspecified, without bleeding: Secondary | ICD-10-CM | POA: Diagnosis not present

## 2019-11-20 DIAGNOSIS — I130 Hypertensive heart and chronic kidney disease with heart failure and stage 1 through stage 4 chronic kidney disease, or unspecified chronic kidney disease: Secondary | ICD-10-CM | POA: Diagnosis present

## 2019-11-20 DIAGNOSIS — I5022 Chronic systolic (congestive) heart failure: Secondary | ICD-10-CM | POA: Diagnosis not present

## 2019-11-20 DIAGNOSIS — E876 Hypokalemia: Secondary | ICD-10-CM | POA: Diagnosis present

## 2019-11-20 DIAGNOSIS — K319 Disease of stomach and duodenum, unspecified: Secondary | ICD-10-CM | POA: Diagnosis present

## 2019-11-20 DIAGNOSIS — K449 Diaphragmatic hernia without obstruction or gangrene: Secondary | ICD-10-CM | POA: Diagnosis present

## 2019-11-20 DIAGNOSIS — J9811 Atelectasis: Secondary | ICD-10-CM | POA: Diagnosis not present

## 2019-11-20 DIAGNOSIS — N183 Chronic kidney disease, stage 3 unspecified: Secondary | ICD-10-CM | POA: Diagnosis not present

## 2019-11-20 DIAGNOSIS — E1122 Type 2 diabetes mellitus with diabetic chronic kidney disease: Secondary | ICD-10-CM | POA: Diagnosis present

## 2019-11-20 DIAGNOSIS — R52 Pain, unspecified: Secondary | ICD-10-CM | POA: Diagnosis not present

## 2019-11-20 DIAGNOSIS — K259 Gastric ulcer, unspecified as acute or chronic, without hemorrhage or perforation: Secondary | ICD-10-CM | POA: Diagnosis not present

## 2019-11-20 DIAGNOSIS — Z6821 Body mass index (BMI) 21.0-21.9, adult: Secondary | ICD-10-CM

## 2019-11-20 DIAGNOSIS — F1090 Alcohol use, unspecified, uncomplicated: Secondary | ICD-10-CM

## 2019-11-20 DIAGNOSIS — I950 Idiopathic hypotension: Secondary | ICD-10-CM | POA: Diagnosis not present

## 2019-11-20 DIAGNOSIS — R630 Anorexia: Secondary | ICD-10-CM | POA: Diagnosis not present

## 2019-11-20 DIAGNOSIS — H409 Unspecified glaucoma: Secondary | ICD-10-CM | POA: Diagnosis present

## 2019-11-20 DIAGNOSIS — B349 Viral infection, unspecified: Secondary | ICD-10-CM | POA: Diagnosis present

## 2019-11-20 DIAGNOSIS — M5136 Other intervertebral disc degeneration, lumbar region: Secondary | ICD-10-CM | POA: Diagnosis present

## 2019-11-20 DIAGNOSIS — R739 Hyperglycemia, unspecified: Secondary | ICD-10-CM | POA: Diagnosis present

## 2019-11-20 DIAGNOSIS — E119 Type 2 diabetes mellitus without complications: Secondary | ICD-10-CM

## 2019-11-20 DIAGNOSIS — H3582 Retinal ischemia: Secondary | ICD-10-CM | POA: Diagnosis present

## 2019-11-20 DIAGNOSIS — R1013 Epigastric pain: Secondary | ICD-10-CM

## 2019-11-20 DIAGNOSIS — H401122 Primary open-angle glaucoma, left eye, moderate stage: Secondary | ICD-10-CM | POA: Diagnosis not present

## 2019-11-20 DIAGNOSIS — R519 Headache, unspecified: Secondary | ICD-10-CM

## 2019-11-20 DIAGNOSIS — K8020 Calculus of gallbladder without cholecystitis without obstruction: Secondary | ICD-10-CM | POA: Diagnosis not present

## 2019-11-20 DIAGNOSIS — Z789 Other specified health status: Secondary | ICD-10-CM

## 2019-11-20 DIAGNOSIS — D509 Iron deficiency anemia, unspecified: Secondary | ICD-10-CM | POA: Diagnosis present

## 2019-11-20 DIAGNOSIS — R42 Dizziness and giddiness: Secondary | ICD-10-CM

## 2019-11-20 DIAGNOSIS — I428 Other cardiomyopathies: Secondary | ICD-10-CM | POA: Diagnosis present

## 2019-11-20 DIAGNOSIS — H348122 Central retinal vein occlusion, left eye, stable: Secondary | ICD-10-CM | POA: Diagnosis not present

## 2019-11-20 DIAGNOSIS — I708 Atherosclerosis of other arteries: Secondary | ICD-10-CM | POA: Diagnosis not present

## 2019-11-20 DIAGNOSIS — I700 Atherosclerosis of aorta: Secondary | ICD-10-CM | POA: Diagnosis not present

## 2019-11-20 DIAGNOSIS — R634 Abnormal weight loss: Secondary | ICD-10-CM | POA: Diagnosis not present

## 2019-11-20 DIAGNOSIS — H5711 Ocular pain, right eye: Secondary | ICD-10-CM | POA: Diagnosis not present

## 2019-11-20 DIAGNOSIS — M549 Dorsalgia, unspecified: Secondary | ICD-10-CM

## 2019-11-20 LAB — COMPREHENSIVE METABOLIC PANEL
ALT: 18 U/L (ref 0–44)
AST: 22 U/L (ref 15–41)
Albumin: 2.4 g/dL — ABNORMAL LOW (ref 3.5–5.0)
Alkaline Phosphatase: 27 U/L — ABNORMAL LOW (ref 38–126)
Anion gap: 7 (ref 5–15)
BUN: 29 mg/dL — ABNORMAL HIGH (ref 8–23)
CO2: 20 mmol/L — ABNORMAL LOW (ref 22–32)
Calcium: 8.1 mg/dL — ABNORMAL LOW (ref 8.9–10.3)
Chloride: 105 mmol/L (ref 98–111)
Creatinine, Ser: 2.49 mg/dL — ABNORMAL HIGH (ref 0.61–1.24)
GFR calc Af Amer: 31 mL/min — ABNORMAL LOW (ref >60)
GFR calc non Af Amer: 26 mL/min — ABNORMAL LOW (ref >60)
Glucose, Bld: 96 mg/dL (ref 70–99)
Potassium: 4.8 mmol/L (ref 3.5–5.1)
Sodium: 132 mmol/L — ABNORMAL LOW (ref 135–145)
Total Bilirubin: 0.7 mg/dL (ref 0.3–1.2)
Total Protein: 5 g/dL — ABNORMAL LOW (ref 6.5–8.1)

## 2019-11-20 LAB — URINALYSIS, ROUTINE W REFLEX MICROSCOPIC
Bilirubin Urine: NEGATIVE
Glucose, UA: NEGATIVE mg/dL
Ketones, ur: NEGATIVE mg/dL
Nitrite: NEGATIVE
Protein, ur: NEGATIVE mg/dL
Specific Gravity, Urine: 1.036 — ABNORMAL HIGH (ref 1.005–1.030)
pH: 5 (ref 5.0–8.0)

## 2019-11-20 LAB — CBC WITH DIFFERENTIAL/PLATELET
Abs Immature Granulocytes: 0 10*3/uL (ref 0.00–0.07)
Basophils Absolute: 0 10*3/uL (ref 0.0–0.1)
Basophils Relative: 0 %
Eosinophils Absolute: 0.2 10*3/uL (ref 0.0–0.5)
Eosinophils Relative: 4 %
HCT: 21.4 % — ABNORMAL LOW (ref 39.0–52.0)
Hemoglobin: 6.6 g/dL — CL (ref 13.0–17.0)
Lymphocytes Relative: 12 %
Lymphs Abs: 0.7 10*3/uL (ref 0.7–4.0)
MCH: 33.7 pg (ref 26.0–34.0)
MCHC: 30.8 g/dL (ref 30.0–36.0)
MCV: 109.2 fL — ABNORMAL HIGH (ref 80.0–100.0)
Monocytes Absolute: 0.4 10*3/uL (ref 0.1–1.0)
Monocytes Relative: 7 %
Neutro Abs: 4.3 10*3/uL (ref 1.7–7.7)
Neutrophils Relative %: 77 %
Platelets: 186 10*3/uL (ref 150–400)
RBC: 1.96 MIL/uL — ABNORMAL LOW (ref 4.22–5.81)
RDW: 16 % — ABNORMAL HIGH (ref 11.5–15.5)
WBC: 5.6 10*3/uL (ref 4.0–10.5)
nRBC: 0 % (ref 0.0–0.2)
nRBC: 0 /100 WBC

## 2019-11-20 LAB — TYPE AND SCREEN: ABO/RH(D): O POS

## 2019-11-20 LAB — SARS CORONAVIRUS 2 BY RT PCR (HOSPITAL ORDER, PERFORMED IN ~~LOC~~ HOSPITAL LAB): SARS Coronavirus 2: NEGATIVE

## 2019-11-20 LAB — BRAIN NATRIURETIC PEPTIDE: B Natriuretic Peptide: 365 pg/mL — ABNORMAL HIGH (ref 0.0–100.0)

## 2019-11-20 LAB — I-STAT CHEM 8, ED
BUN: 34 mg/dL — ABNORMAL HIGH (ref 8–23)
Calcium, Ion: 1.17 mmol/L (ref 1.15–1.40)
Chloride: 105 mmol/L (ref 98–111)
Creatinine, Ser: 2.3 mg/dL — ABNORMAL HIGH (ref 0.61–1.24)
Glucose, Bld: 90 mg/dL (ref 70–99)
HCT: 22 % — ABNORMAL LOW (ref 39.0–52.0)
Hemoglobin: 7.5 g/dL — ABNORMAL LOW (ref 13.0–17.0)
Potassium: 4.8 mmol/L (ref 3.5–5.1)
Sodium: 134 mmol/L — ABNORMAL LOW (ref 135–145)
TCO2: 20 mmol/L — ABNORMAL LOW (ref 22–32)

## 2019-11-20 LAB — PREPARE RBC (CROSSMATCH)

## 2019-11-20 LAB — TROPONIN I (HIGH SENSITIVITY): Troponin I (High Sensitivity): 8 ng/L (ref <18)

## 2019-11-20 LAB — LACTIC ACID, PLASMA: Lactic Acid, Venous: 1.6 mmol/L (ref 0.5–1.9)

## 2019-11-20 LAB — SALICYLATE LEVEL: Salicylate Lvl: <7 mg/dL — ABNORMAL LOW (ref 7.0–30.0)

## 2019-11-20 LAB — CBG MONITORING, ED: Glucose-Capillary: 87 mg/dL (ref 70–99)

## 2019-11-20 MED ORDER — VANCOMYCIN HCL 500 MG/100ML IV SOLN
500.0000 mg | Freq: Once | INTRAVENOUS | Status: AC
  Administered 2019-11-20: 500 mg via INTRAVENOUS
  Filled 2019-11-20: qty 100

## 2019-11-20 MED ORDER — ALLOPURINOL 100 MG PO TABS
200.0000 mg | ORAL_TABLET | Freq: Every day | ORAL | Status: DC
  Administered 2019-11-21 – 2019-11-26 (×6): 200 mg via ORAL
  Filled 2019-11-20 (×6): qty 2

## 2019-11-20 MED ORDER — LACTATED RINGERS IV BOLUS
1000.0000 mL | Freq: Once | INTRAVENOUS | Status: AC
  Administered 2019-11-20: 1000 mL via INTRAVENOUS

## 2019-11-20 MED ORDER — VANCOMYCIN HCL IN DEXTROSE 1-5 GM/200ML-% IV SOLN
1000.0000 mg | Freq: Once | INTRAVENOUS | Status: AC
  Administered 2019-11-20: 1000 mg via INTRAVENOUS
  Filled 2019-11-20: qty 200

## 2019-11-20 MED ORDER — LACTATED RINGERS IV BOLUS
1500.0000 mL | Freq: Once | INTRAVENOUS | Status: AC
  Administered 2019-11-20: 1500 mL via INTRAVENOUS

## 2019-11-20 MED ORDER — MAGNESIUM SULFATE 2 GM/50ML IV SOLN
2.0000 g | Freq: Once | INTRAVENOUS | Status: AC
  Administered 2019-11-20: 2 g via INTRAVENOUS
  Filled 2019-11-20: qty 50

## 2019-11-20 MED ORDER — CELECOXIB 100 MG PO CAPS
100.0000 mg | ORAL_CAPSULE | Freq: Two times a day (BID) | ORAL | Status: DC
  Administered 2019-11-21 – 2019-11-24 (×7): 100 mg via ORAL
  Filled 2019-11-20 (×8): qty 1

## 2019-11-20 MED ORDER — MAGNESIUM SULFATE 2 GM/50ML IV SOLN
2.0000 g | Freq: Once | INTRAVENOUS | Status: DC
  Filled 2019-11-20: qty 50

## 2019-11-20 MED ORDER — LACTATED RINGERS IV SOLN: INTRAVENOUS | Status: AC

## 2019-11-20 MED ORDER — ATORVASTATIN CALCIUM 10 MG PO TABS
20.0000 mg | ORAL_TABLET | Freq: Every day | ORAL | Status: DC
  Administered 2019-11-21 – 2019-11-26 (×6): 20 mg via ORAL
  Filled 2019-11-20 (×6): qty 2

## 2019-11-20 MED ORDER — FOLIC ACID 1 MG PO TABS
0.5000 mg | ORAL_TABLET | Freq: Every day | ORAL | Status: DC
  Administered 2019-11-22 – 2019-11-26 (×5): 0.5 mg via ORAL
  Filled 2019-11-20 (×6): qty 1

## 2019-11-20 MED ORDER — SODIUM CHLORIDE 0.9% IV SOLUTION: Freq: Once | INTRAVENOUS | Status: DC

## 2019-11-20 MED ORDER — ACETAMINOPHEN 325 MG PO TABS
650.0000 mg | ORAL_TABLET | Freq: Four times a day (QID) | ORAL | Status: DC | PRN
  Administered 2019-11-20 – 2019-11-26 (×11): 650 mg via ORAL
  Filled 2019-11-20 (×12): qty 2

## 2019-11-20 MED ORDER — ALBUTEROL SULFATE (2.5 MG/3ML) 0.083% IN NEBU: 2.5000 mg | INHALATION_SOLUTION | Freq: Four times a day (QID) | RESPIRATORY_TRACT | Status: DC | PRN

## 2019-11-20 MED ORDER — DEXTROSE 10 % IV BOLUS: 5.0000 mL/kg | Freq: Once | INTRAVENOUS | Status: DC

## 2019-11-20 MED ORDER — SODIUM CHLORIDE 0.9 % IV SOLN
2.0000 g | Freq: Two times a day (BID) | INTRAVENOUS | Status: DC
  Administered 2019-11-21 – 2019-11-22 (×3): 2 g via INTRAVENOUS
  Filled 2019-11-20 (×3): qty 2

## 2019-11-20 MED ORDER — METRONIDAZOLE IN NACL 5-0.79 MG/ML-% IV SOLN: 500.0000 mg | Freq: Once | INTRAVENOUS | Status: DC

## 2019-11-20 MED ORDER — FENOFIBRATE 160 MG PO TABS
160.0000 mg | ORAL_TABLET | Freq: Every day | ORAL | Status: DC
  Administered 2019-11-21 – 2019-11-26 (×6): 160 mg via ORAL
  Filled 2019-11-20 (×6): qty 1

## 2019-11-20 MED ORDER — DEXTROSE 50 % IV SOLN
1.0000 | Freq: Once | INTRAVENOUS | Status: DC
  Filled 2019-11-20: qty 50

## 2019-11-20 MED ORDER — VANCOMYCIN HCL IN DEXTROSE 1-5 GM/200ML-% IV SOLN: 1000.0000 mg | INTRAVENOUS | Status: DC

## 2019-11-20 MED ORDER — SODIUM CHLORIDE 0.9 % IV SOLN
2.0000 g | Freq: Once | INTRAVENOUS | Status: AC
  Administered 2019-11-20: 2 g via INTRAVENOUS
  Filled 2019-11-20: qty 2

## 2019-11-20 MED ORDER — POTASSIUM CHLORIDE 10 MEQ/100ML IV SOLN
10.0000 meq | Freq: Once | INTRAVENOUS | Status: DC
  Filled 2019-11-20: qty 100

## 2019-11-20 MED ORDER — IOHEXOL 350 MG/ML SOLN
100.0000 mL | Freq: Once | INTRAVENOUS | Status: AC | PRN
  Administered 2019-11-20: 100 mL via INTRAVENOUS

## 2019-11-20 MED ORDER — ASPIRIN EC 81 MG PO TBEC
162.5000 mg | DELAYED_RELEASE_TABLET | Freq: Every day | ORAL | Status: DC
  Administered 2019-11-21: 162.5 mg via ORAL
  Filled 2019-11-20: qty 2

## 2019-11-20 NOTE — H&P (Addendum)
History and Physical    Travis [name] ELF:[id number] DOB: 1955-11-05 DOA: 11/20/2019  PCP: Travis Fenton, NP  Patient coming from: Home  I have personally briefly reviewed patient's old medical records in [place]  Chief Complaint: Bilateral eye soreness, dizziness and lower back pain  HPI: Travis [name] is a 64 y.o. male with medical history significant for nonischemic cardiomyopathy, history of left central retinal occlusion, chronic systolic heart failure, COPD, TIA, type 2 diabetes, hypertension hyperlipidemia who presents with concerns of eye soreness, dizziness and lower back pain.  For the past 2 days he has noticed some soreness to the back of his eyes bilaterally.  He denies any vision changes.  The soreness is also associated with some mild coughing and sneezing as well as frontal throbbing headache with some mild photosensitivity.  Denies any neck stiffness but feels like his left neck is sore.  Also has noted dizziness worse with sitting up.  He saw his PCP and was started on Zyrtec.  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  No new trauma.  He otherwise denies any shortness of breath or chest pain.  No nausea vomiting or diarrhea. No abdominal pain. No dark stools or bright red blood. No easy bleeding or bruising.  He reports 11 pound weight loss in the past month but has been having decreased appetite.  He endorsed smoking half a pack of tobacco a day.  He normally is a heavy drinker of about half a pint per week but has not had a drink since 2 weeks ago.  He denies any IV or illicit drug use. He is sexually active with male partner and denies any STDs or sick contact with her.   He was hospitalized overnight on 11/08/2019 to 11/09/2019 and at that time he presented with dizziness, transient bilateral vision loss and scapular pain.  Ophthalmology was consulted and recommended outpatient follow-up.  He was admitted for TIA  work-up and had negative CT and negative MRI.   He was also notably found to be anemic with hemoglobin down to 9 from a prior of 12.9 in May.  He had negative iron panel, B12, folate, and FOBT at that time. Also was found to be hypotensive initially and had his Coreg decreased.  He is also since followed up with cardiology outpatient and had his spironolactone discontinued.  Patient states that he has been following this new medication regimen.   ED Course: He was found to be febrile up to 103, tachycardic and tachypneic and hypotensive down to systolic of 58N over 27P.  This is improved to systolic above 824 with 2 L of IV LR fluid resuscitation.  He was found to have worsening anemia down to 6.6 and is being transfused 1 unit of PRBC.  CBC shows no leukocytosis however he was pancytopenic in his last admission.  His WBC is at 5.6 from a prior of 2.5.  Other electrolytes largely normal.  Creatinine stable around his baseline of 2-2.4.  LFTs are normal.  CTA chest/abdomen and pelvis was obtained to evaluate for dissection which was negative.  Degenerative changes seen in the lumbar spine.  Review of Systems:  Constitutional: No Weight Change, No Fever ENT/Mouth: No sore throat, No Rhinorrhea Eyes: +Eye Pain, No Vision Changes Cardiovascular: No Chest Pain, no SOB Respiratory: +Cough, No Sputum, No Wheezing, no Dyspnea  Gastrointestinal: No Nausea, No Vomiting, No Diarrhea, No Constipation, No Pain  Genitourinary: no Urinary Incontinence, No Urgency, No Flank Pain Musculoskeletal: No Arthralgias, No Myalgias Skin: No Skin Lesions, No Pruritus, Neuro: no Weakness, No Numbness,  No Loss of Consciousness, No Syncope Psych: No Anxiety/Panic, No Depression, no decrease appetite Heme/Lymph: No Bruising, No Bleeding  Past Medical History:  Diagnosis Date  . Alcohol abuse   . 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 05/2019 Echo: EF 35-40%.  . Allergy   . Asthma   . Central retinal artery  occlusion of left eye 09/13/13  . Chronic anemia   . Clotting disorder ([place])   . Diabetes mellitus, type 2 ([place])    pt reports his DM is gone  . GI bleed    15 years ago  . Gout   . Hemoptysis    secondary to pulmonary edema  . HFrEF (heart failure with reduced ejection fraction) ([place])    a. 12/2010 Echo: EF 20-25%; b 08/2013 Echo: EF 45-50%; c. 01/2015 Echo: EF 25-30%; d. 07/2016 Echo: EF 35-40%; e. 05/2019 Echo: EF 35-40%, glob HK. Mild LVH. Gr1 DD. Nl RV fxn.  . Hyperlipidemia   . Hypertension   . Nonischemic cardiomyopathy ([place])   . Nonobstructive Coronary artery disease    a. 01/2015 Cath: LM nl, LAD 30p/m, LCX nl, RCA 10p/m, RPDA min irregs.  . Osteoarthritis   . Pneumonia   . TIA (transient ischemic attack)   . Tobacco abuse   . Vision loss    peripherial vision only left eye.[facility] Retin[facility]  artery occusion     Past Surgical History:  Procedure Laterality Date  . CARDIAC CATHETERIZATION    . CARDIAC CATHETERIZATION N/A 01/28/2015   Procedure: Left Heart Cath and Coronary Angiography;  Surgeon: Wellington Hampshire, MD;  Location: [facility];  Service: Cardiovascular;  Laterality: N/A;  . COLONOSCOPY    . HIP ARTHROPLASTY Right 03/15/2013   Procedure: ARTHROPLASTY BIPOLAR HIP;  Surgeon: [name], MD;  Location: [facility];  Service: Orthopedics;  Laterality: Right;  . TOTAL SHOULDER ARTHROPLASTY Right 09/01/2015   Procedure: RIGHT TOTAL SHOULDER ARTHROPLASTY;  Surgeon: [name], MD;  Location: [facility];  Service: Orthopedics;  Laterality: Right;     reports that he has been smoking cigarettes. He has a 15.00 pack-year smoking history. He has never used smokeless tobacco. He reports current alcohol use of about 1.0 standard drink of alcohol per week. He reports current drug use. Drug: Marijuana. Social History  No Known Allergies  Family History  Problem Relation Age of Onset  . Diabetes Mother   . Hypertension Mother   . Diabetes Father   . Hypertension  Father   . Diabetes Sister   . Dementia Brother   . Cancer Neg Hx   . Heart disease Neg Hx   . Stroke Neg Hx   . Colon cancer Neg Hx   . Esophageal cancer Neg Hx   . Rectal cancer Neg Hx   . Stomach cancer Neg Hx    Denies family history of hematological disorder  Prior to Admission medications   Medication Sig Start Date End Date Taking? Authorizing Provider  albuterol (PROVENTIL HFA;VENTOLIN HFA) 108 (90 BASE) MCG/ACT inhaler Inhale 2 puffs into the lungs every 6 (six) hours as needed for wheezing or shortness of breath. Take 2 puffs every 5-10 minutes up to 6 puffs total over 15 minutes when needed.  Use with a spacer. 01/24/15  Yes Ahmed Prima, MD  allopurinol (ZYLOPRIM) 100 MG tablet Take 2 tablets (200  mg total) by mouth daily. 01/23/19  Yes Travis Fenton, NP  aspirin EC 325 MG tablet Take 162.5 mg by mouth daily.   Yes [provider]  atorvastatin (LIPITOR) 20 MG tablet Take 1 tablet (20 mg total) by mouth daily. 06/19/17 11/07/20 Yes Wellington Hampshire, MD  carvedilol (COREG) 3.125 MG tablet Take 1 tablet (3.125 mg total) by mouth 2 (two) times daily with a meal. 11/09/19  Yes Adhikari, Tamsen Meek, MD  celecoxib (CELEBREX) 100 MG capsule Take 1 capsule (100 mg total) by mouth 2 (two) times daily. 09/09/19  Yes Baity, Coralie Keens, NP  cetirizine (ZYRTEC) 10 MG tablet TAKE 1 TABLET BY MOUTH DAILY Patient taking differently: Take 10 mg by mouth daily.  07/22/19  Yes Baity, Coralie Keens, NP  cyclobenzaprine (FLEXERIL) 10 MG tablet Take 1 tablet (10 mg total) by mouth 3 (three) times daily as needed for muscle spasms. 07/30/19  Yes Baity, Coralie Keens, NP  ENTRESTO 97-103 MG TAKE 1 TABLET BY MOUTH TWICE (2) DAILY Patient taking differently: Take 1 tablet by mouth 2 (two) times daily.  05/27/19  Yes Hackney, Tina A, FNP  fenofibrate (TRICOR) 145 MG tablet TAKE 1 TABLET BY MOUTH ONCE A DAY Patient taking differently: Take 145 mg by mouth daily.  10/20/19  Yes Baity, Coralie Keens, NP  fluticasone  (FLONASE) 50 MCG/ACT nasal spray Place 1 spray into both nostrils 2 (two) times daily. Patient taking differently: Place 1 spray into both nostrils daily as needed for allergies or rhinitis.  03/29/16  Yes Cuthriell, Charline Bills, PA-C  folic acid (FOLVITE) 1 MG tablet Take 1 tablet (1 mg total) by mouth daily. Patient taking differently: Take 0.5 mg by mouth daily.  09/20/16  Yes Hackney, Tina A, FNP  lidocaine (LIDODERM) 5 % Place 1 patch onto the skin daily. Remove & Discard patch within 12 hours or as directed by MD Patient taking differently: Place 1 patch onto the skin daily as needed (pain). Remove & Discard patch within 12 hours or as directed by MD 07/30/19  Yes Travis Fenton, NP  Multiple Vitamin (MULTIVITAMIN WITH MINERALS) TABS tablet Take 1 tablet by mouth daily. Men's One a Day   Yes [provider]  Omega-3 Fatty Acids (FISH OIL PO) Take 1 capsule by mouth daily.   Yes [provider]  triamcinolone cream (KENALOG) 0.1 % APPLY TO AFFECTED AREAS TWICE A DAY AS NEEDED. AVOID FACE,GROIN, OR UNDERARMS Patient taking differently: Apply 1 application topically 2 (two) times daily as needed (eczema).  10/01/19  Yes Baity, Coralie Keens, NP  baclofen (LIORESAL) 10 MG tablet Take 1 tablet (10 mg total) by mouth 3 (three) times daily as needed for muscle spasms (sedation caution). Patient not taking: Reported on 11/20/2019 06/25/19   Tonia Ghent, MD    Physical Exam: Vitals:   11/20/19 1745 11/20/19 1830 11/20/19 1849 11/20/19 1906  BP: (!) 84/60 97/61 99/63 102/63  Pulse: (!) 106 (!) 107 (!) 106 (!) 106  Resp: 19 20 (!) 23 (!) 28  Temp:   (!) 101.4 F (38.6 C) (!) 103 F (39.4 C)  TempSrc:   Oral Oral  SpO2: 98% 100% 99% 100%    Constitutional: NAD, calm, comfortable, thin elderly male laying flat in bed Vitals:   11/20/19 1745 11/20/19 1830 11/20/19 1849 11/20/19 1906  BP: (!) 84/60 97/61 99/63 102/63  Pulse: (!) 106 (!) 107 (!) 106 (!) 106  Resp: 19 20 (!) 23 (!) 28    Temp:   [name])  101.4 F (38.6 C) (!) 103 F (39.4 C)  TempSrc:   Oral Oral  SpO2: 98% 100% 99% 100%   Eyes: PERRL, lids and conjunctivae normal ENMT: Mucous membranes are moist.  No oral lesions or petechiae.  Clear posterior pharynx without exudates  neck: normal, supple, no stiffness  respiratory: clear to auscultation bilaterally, no wheezing, no crackles. Normal respiratory effort. No accessory muscle use.  Cardiovascular: Regular rate and rhythm, no murmurs / rubs / gallops. No extremity edema. 2+ pedal pulses. No carotid bruits.  Abdomen: no tenderness, no masses palpated.  Bowel sounds positive.  Musculoskeletal: no clubbing / cyanosis. No joint deformity upper and lower extremities. Good ROM, no contractures. Normal muscle tone.  Skin: no rashes, lesions, ulcers.  No bruising or petechiae.  No induration Neurologic: CN 2-12 grossly intact.  No nystagmus.  Sensation intact. Strength 5/5 in all 4.  Notable weakness when attempting to sit up in bed. Psychiatric: Normal judgment and insight. Alert and oriented x 3. Normal mood.   Labs on Admission: I have personally reviewed following labs and imaging studies  CBC: Recent Labs  Lab 11/20/19 1547 11/20/19 1600  WBC  --  5.6  NEUTROABS  --  4.3  HGB 7.5* 6.6*  HCT 22.0* 21.4*  MCV  --  109.2*  PLT  --  149   Basic Metabolic Panel: Recent Labs  Lab 11/20/19 1547 11/20/19 1600  NA 134* 132*  K 4.8 4.8  CL 105 105  CO2  --  20*  GLUCOSE 90 96  BUN 34* 29*  CREATININE 2.30* 2.49*  CALCIUM  --  8.1*   GFR: Estimated Creatinine Clearance: 33.5 mL/min (A) (by C-G formula based on SCr of 2.49 mg/dL (H)). Liver Function Tests: Recent Labs  Lab 11/20/19 1600  AST 22  ALT 18  ALKPHOS 27*  BILITOT 0.7  PROT 5.0*  ALBUMIN 2.4*   No results for input(s): LIPASE, AMYLASE in the last 168 hours. No results for input(s): AMMONIA in the last 168 hours. Coagulation Profile: No results for input(s): INR, PROTIME in the last  168 hours. Cardiac Enzymes: No results for input(s): CKTOTAL, CKMB, CKMBINDEX, TROPONINI in the last 168 hours. BNP (last 3 results) No results for input(s): PROBNP in the last 8760 hours. HbA1C: No results for input(s): HGBA1C in the last 72 hours. CBG: Recent Labs  Lab 11/20/19 1533  GLUCAP 87   Lipid Profile: No results for input(s): CHOL, HDL, LDLCALC, TRIG, CHOLHDL, LDLDIRECT in the last 72 hours. Thyroid Function Tests: No results for input(s): TSH, T4TOTAL, FREET4, T3FREE, THYROIDAB in the last 72 hours. Anemia Panel: No results for input(s): VITAMINB12, FOLATE, FERRITIN, TIBC, IRON, RETICCTPCT in the last 72 hours. Urine analysis:    Component Value Date/Time   COLORURINE YELLOW 11/20/2019 1600   APPEARANCEUR HAZY (A) 11/20/2019 1600   APPEARANCEUR Cloudy 11/19/2012 1107   LABSPEC 1.036 (H) 11/20/2019 1600   LABSPEC 1.024 11/19/2012 1107   PHURINE 5.0 11/20/2019 1600   GLUCOSEU NEGATIVE 11/20/2019 1600   GLUCOSEU Negative 11/19/2012 1107   HGBUR SMALL (A) 11/20/2019 1600   BILIRUBINUR NEGATIVE 11/20/2019 1600   BILIRUBINUR Negative 11/19/2012 1107   KETONESUR NEGATIVE 11/20/2019 1600   PROTEINUR NEGATIVE 11/20/2019 1600   UROBILINOGEN 0.2 09/14/2013 1225   NITRITE NEGATIVE 11/20/2019 1600   LEUKOCYTESUR TRACE (A) 11/20/2019 1600   LEUKOCYTESUR Negative 11/19/2012 1107    Radiological Exams on Admission: DG Chest Port 1 View  Result Date: 11/20/2019 CLINICAL DATA:  Questionable sepsis. EXAM:  PORTABLE CHEST 1 VIEW COMPARISON:  11/08/2019 FINDINGS: Heart and mediastinal contours are within normal limits. No focal opacities or effusions. No acute bony abnormality. IMPRESSION: No active disease. Electronically Signed   By: Rolm Baptise M.D.   On: 11/20/2019 15:53   CT Angio Chest/Abd/Pel for Dissection W and/or Wo Contrast  Result Date: 11/20/2019 CLINICAL DATA:  Abdominal pain question aortic dissection EXAM: CT ANGIOGRAPHY CHEST, ABDOMEN AND PELVIS TECHNIQUE:  Non-contrast CT of the chest was initially obtained. Multidetector CT imaging through the chest, abdomen and pelvis was performed using the standard protocol during bolus administration of intravenous contrast. Multiplanar reconstructed images and MIPs were obtained and reviewed to evaluate the vascular anatomy. CONTRAST:  160m OMNIPAQUE IOHEXOL 350 MG/ML SOLN IV COMPARISON:  CT chest abdomen pelvis 07/26/2019 FINDINGS: CTA CHEST FINDINGS Cardiovascular: Scattered atherosclerotic calcifications aorta, proximal great vessels, and coronary arteries. Aorta normal caliber. No intramural hematoma on precontrast imaging. Normal aortic enhancement following contrast without evidence of dissection. Heart unremarkable. No pericardial effusion. Pulmonary arteries grossly patent on non targeted exam. Mediastinum/Nodes: Base of cervical region normal appearance. Esophagus unremarkable. Multiple normal and upper normal sized mediastinal lymph nodes. 11 mm RIGHT paratracheal node image 59, unchanged. 12 mm RIGHT paratracheal node image 52, previously 10 mm. No hilar or axillary adenopathy. BILATERAL gynecomastia. Lungs/Pleura: Linear subsegmental atelectasis BILATERAL lower lobes. Lungs otherwise clear. No infiltrate, pleural effusion or pneumothorax. Musculoskeletal: RIGHT shoulder prosthesis. Degenerative changes LEFT glenohumeral joint. Degenerative disc disease changes lower cervical spine. Review of the MIP images confirms the above findings. CTA ABDOMEN AND PELVIS FINDINGS VASCULAR Aorta: Scattered atherosclerotic calcifications abdominal aorta. Minimal eccentric noncalcified plaque. Normal caliber without aneurysm or dissection. Celiac: Widely patent SMA: Widely patent Renals: Patent BILATERAL renal arteries. Accessory renal artery to upper pole LEFT kidney. IMA: Patent Inflow: Scattered atherosclerotic calcifications in the iliac arteries and common femoral arteries without high-grade stenosis Veins: Predominately  unopacified at time of CTA imaging Review of the MIP images confirms the above findings. NON-VASCULAR Hepatobiliary: Contracted gallbladder with single tiny gallstone. Liver unremarkable. Pancreas: Normal appearance Spleen: Normal appearance Adrenals/Urinary Tract: Adrenal glands normal appearance. Cortical thinning in the kidneys. Tiny LEFT renal cyst. Kidneys and ureters otherwise unremarkable. Bladder decompressed. Stomach/Bowel: Scattered colonic diverticula greatest at ascending colon. No evidence of diverticulitis. Normal appendix. Stomach and bowel loops otherwise normal appearance. Lymphatic: No adenopathy Reproductive: Unremarkable prostate gland and seminal vesicles Other: No free air or free fluid. Small umbilical hernia containing fat. No inguinal hernias. No inflammatory process. Musculoskeletal: RIGHT hip prosthesis. Soft tissue calcifications adjacent to proximal RIGHT femur. Bones demineralized with degenerative disc disease changes of the lumbar spine. Review of the MIP images confirms the above findings. IMPRESSION: Atherosclerotic calcifications of aorta, proximal great vessels, and coronary arteries. No evidence of aortic aneurysm or dissection. Linear subsegmental atelectasis BILATERAL lower lobes. Scattered colonic diverticulosis without evidence of diverticulitis. Small umbilical hernia containing fat. Cholelithiasis. Nonspecific minimally enlarged mediastinal lymph nodes little changed from 07/26/2019. Aortic Atherosclerosis (ICD10-I70.0). Electronically Signed   By: MLavonia DanaM.D.   On: 11/20/2019 16:02      Assessment/Plan  Hypotension Likely due to medication and continually worsening anemia.  He has been having ongoing hypertension issues since his last admission 2 weeks ago where he had his Coreg decreased and spironolactone discontinued by cardiology. Hold all antihypertensives for now Blood pressure improving following 2 L of IV LR fluid and blood transfusion Obtain  morning cortisol level   Anemia Continues downward trending hemoglobin since 3 months ago. 12.9 (  5/21) --> 8.9 (11/08/19) --> 6.6 Transfuse 1 unit pRBC Iron panel, B12 and folate normal and FOBT negative two weeks ago Obtain reticulocyte count, LDH, haptoglobin, PT/PTT to assess for hemolytic anemia  Fever of unknown origin Unclear etiology.  Chest x-ray, UA, CTA negative.  Covid PCR negative. Patient did have pancytopenia during his last admission and now his WBC and platelets are improved but still at the low end of normal  likely reactive to this new infection.  Unclear if he has some hematological malignancy. Will also obtain EBV, respiratory viral panel, ESR/CRP Blood culture pending Continue broad-spectrum antibiotics with IV vancomycin and cefepime  Chronic systolic heart failure  Stable  CKD stage 3b stable creatinine with baseline around 2-2.4  Type 2 diabetes Last hemoglobin A1c of 7.3 on 8/16 Blood glucose controlled here  Tobacco use   Encourage cessation  Alcohol use  Reports last use about 2 weeks ago.  Normally does have a pint per week. Obtain ETOH level No signs of withdrawal.  DVT prophylaxis: SCD Code Status: Full Family Communication: Plan discussed with patient at bedside  disposition Plan: Home with at least 2 midnight stays  Consults called:  Admission status: inpatient  Status is: Inpatient  Remains inpatient appropriate because:Inpatient level of care appropriate due to severity of illness   Dispo: The patient is from: Home              Anticipated d/c is to: Home              Anticipated d/c date is: 3 days              Patient currently is not medically stable to d/c.         Orene Desanctis DO [facility]   If 7PM-7AM, please contact night-coverage [email or web address]   11/20/2019, 8:10 PM

## 2019-11-20 NOTE — ED Provider Notes (Deleted)
[place]    CSN: [id number] Arrival date & time: 11/20/19  1443      History   Chief Complaint Chief Complaint  Patient presents with  . Back Pain    HPI RYLIE KNIERIM is a 64 y.o. male.   Patient is a 64 year old male past medical history of alcohol abuse, alcoholic cardiomyopathy, allergy, asthma, chronic anemia, diabetes, GI bleed, gout, heart failure, pneumonia, TIA. He presents today with pain between shoulder blades, lower back pain, dizziness, weakness, headache. Symptoms been constant and worsening. He was seen at the heart failure clinic approximate 3 days ago and noted per chart blood pressures 85/52. Symptoms have been worsening over the past 3 days with some shortness of breath. Severe headache and pressure behind eyes. Loss of appetite and unable to eat. Was seen and admitted to the hospital on 11/08/2019 for acute kidney injury and TIA. Was also hypotensive at that time. Patient presents today with blood pressure readings different and right versus left arm. Left arm reading normalized around 98/56 and right arm reading 61/50. Patient is sitting in wheelchair, pale, in obvious discomfort. Otherwise alert and talking.      Past Medical History:  Diagnosis Date  . Alcohol abuse   . 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 05/2019 Echo: EF 35-40%.  . Allergy   . Asthma   . 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 Chronic anemia   . Clotting disorder ([place])   . Diabetes mellitus, type 2 ([place])    pt reports his DM is gone  . GI bleed    15 years ago  . Gout   . Hemoptysis    secondary to pulmonary edema  . HFrEF (heart failure with reduced ejection fraction) ([place])    a. 12/2010 Echo: EF 20-25%; b 08/2013 Echo: EF 45-50%; c. 01/2015 Echo: EF 25-30%; d. 07/2016 Echo: EF 35-40%; e. 05/2019 Echo: EF 35-40%, glob HK. Mild LVH. Gr1 DD. Nl RV fxn.  . Hyperlipidemia   . Hypertension   . Nonischemic cardiomyopathy ([place])   . Nonobstructive  Coronary artery disease    a. 01/2015 Cath: LM nl, LAD 30p/m, LCX nl, RCA 10p/m, RPDA min irregs.  . Osteoarthritis   . Pneumonia   . TIA (transient ischemic attack)   . Tobacco abuse   . Vision loss    peripherial vision only left eye.[place]  artery occusion     Patient Active Problem List   Diagnosis Date Noted  . TIA (transient ischemic attack) 11/08/2019  . Osteoarthritis 05/09/2016  . COPD (chronic obstructive pulmonary disease) ([place]) 05/09/2016  . Essential hypertension 04/18/2015  . Literacy level of illiterate 06/04/2014  . 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 Cardiomyopathy, nonischemic ([place]) 03/15/2013  . Chronic systolic heart failure ([place]) 01/05/2011  . Tobacco use 01/05/2011  . Diabetes ([place]) 11/30/2008  . HLD (hyperlipidemia) 11/30/2008  . Gout 11/30/2008    Past Surgical History:  Procedure Laterality Date  . CARDIAC CATHETERIZATION    . CARDIAC CATHETERIZATION N/A 01/28/2015   Procedure: Left Heart Cath and Coronary Angiography;  Surgeon: Wellington Hampshire, MD;  Location: [place];  Service: Cardiovascular;  Laterality: N/A;  . COLONOSCOPY    . HIP ARTHROPLASTY Right 03/15/2013   Procedure: ARTHROPLASTY BIPOLAR HIP;  Surgeon: [name], MD;  Location: [facility];  Service: Orthopedics;  Laterality: Right;  . TOTAL SHOULDER ARTHROPLASTY Right 09/01/2015   Procedure: RIGHT TOTAL SHOULDER ARTHROPLASTY;  Surgeon:  [name], MD;  Location: [place];  Service: Orthopedics;  Laterality: Right;       Home Medications    Prior to Admission medications   Medication Sig Start Date End Date Taking? Authorizing Provider  albuterol (PROVENTIL HFA;VENTOLIN HFA) 108 (90 BASE) MCG/ACT inhaler Inhale 2 puffs into the lungs every 6 (six) hours as needed for wheezing or shortness of breath. Take 2 puffs every 5-10 minutes up to 6 puffs total over 15 minutes when needed.  Use with a spacer. 01/24/15   Ahmed Prima, MD  allopurinol  (ZYLOPRIM) 100 MG tablet Take 2 tablets (200 mg total) by mouth daily. 01/23/19   [name], NP  aspirin EC 325 MG tablet Take 162.5 mg by mouth daily.    [provider]  atorvastatin (LIPITOR) 20 MG tablet Take 1 tablet (20 mg total) by mouth daily. 06/19/17 11/07/20  Wellington Hampshire, MD  baclofen (LIORESAL) 10 MG tablet Take 1 tablet (10 mg total) by mouth 3 (three) times daily as needed for muscle spasms (sedation caution). Patient taking differently: Take 10 mg by mouth 3 (three) times daily as needed for muscle spasms.  06/25/19   Tonia Ghent, MD  carvedilol (COREG) 3.125 MG tablet Take 1 tablet (3.125 mg total) by mouth 2 (two) times daily with a meal. 11/09/19   Shelly Coss, MD  celecoxib (CELEBREX) 100 MG capsule Take 1 capsule (100 mg total) by mouth 2 (two) times daily. 09/09/19   [name], NP  cetirizine (ZYRTEC) 10 MG tablet TAKE 1 TABLET BY MOUTH DAILY Patient taking differently: Take 10 mg by mouth daily.  07/22/19   [name], NP  cyclobenzaprine (FLEXERIL) 10 MG tablet Take 1 tablet (10 mg total) by mouth 3 (three) times daily as needed for muscle spasms. 07/30/19   Baity, Coralie Keens, NP  ENTRESTO 97-103 MG TAKE 1 TABLET BY MOUTH TWICE (2) DAILY Patient taking differently: Take 1 tablet by mouth 2 (two) times daily.  05/27/19   Alisa Graff, FNP  fenofibrate (TRICOR) 145 MG tablet TAKE 1 TABLET BY MOUTH ONCE A DAY Patient taking differently: Take 145 mg by mouth daily.  10/20/19   [name], NP  fluticasone (FLONASE) 50 MCG/ACT nasal spray Place 1 spray into both nostrils 2 (two) times daily. Patient taking differently: Place 1 spray into both nostrils daily as needed for allergies or rhinitis.  03/29/16   Cuthriell, Charline Bills, PA-C  folic acid (FOLVITE) 1 MG tablet Take 1 tablet (1 mg total) by mouth daily. Patient taking differently: Take 0.5 mg by mouth daily.  09/20/16   Alisa Graff, FNP  lidocaine (LIDODERM) 5 % Place 1 patch onto the skin  daily. Remove & Discard patch within 12 hours or as directed by MD Patient taking differently: Place 1 patch onto the skin daily as needed (pain). Remove & Discard patch within 12 hours or as directed by MD 07/30/19   [name], NP  Multiple Vitamin (MULTIVITAMIN WITH MINERALS) TABS tablet Take 1 tablet by mouth daily. Men's One a Day    [provider]  Omega-3 Fatty Acids (FISH OIL PO) Take 1 capsule by mouth daily.    [provider]  spironolactone (ALDACTONE) 25 MG tablet Take 0.5 tablets (12.5 mg total) by mouth daily. 07/03/19   Alisa Graff, FNP  triamcinolone cream (KENALOG) 0.1 % APPLY TO AFFECTED AREAS TWICE A DAY AS NEEDED. AVOID FACE,GROIN, OR UNDERARMS Patient taking differently:  Apply 1 application topically 2 (two) times daily as needed.  10/01/19   [name], NP    Family History Family History  Problem Relation Age of Onset  . Diabetes Mother   . Hypertension Mother   . Diabetes Father   . Hypertension Father   . Diabetes Sister   . Dementia Brother   . Cancer Neg Hx   . Heart disease Neg Hx   . Stroke Neg Hx   . Colon cancer Neg Hx   . Esophageal cancer Neg Hx   . Rectal cancer Neg Hx   . Stomach cancer Neg Hx     Social History Social History   Tobacco Use  . Smoking status: Current Every Day Smoker    Packs/day: 0.50    Years: 30.00    Pack years: 15.00    Types: Cigarettes  . Smokeless tobacco: Never Used  Substance Use Topics  . Alcohol use: Yes    Alcohol/week: 1.0 standard drink    Types: 1 Glasses of wine per week    Comment: occasionally  . Drug use: Yes    Types: Marijuana     Allergies   Patient has no known allergies.   Review of Systems Review of Systems   Physical Exam Triage Vital Signs ED Triage Vitals  Enc Vitals Group     BP      Pulse      Resp      Temp      Temp src      SpO2      Weight      Height      Head Circumference      Peak Flow      Pain Score      Pain Loc      Pain Edu?       Excl. in [place]?    No data found.  Updated Vital Signs There were no vitals taken for this visit.  Visual Acuity Right Eye Distance:   Left Eye Distance:   Bilateral Distance:    Right Eye Near:   Left Eye Near:    Bilateral Near:     Physical Exam Vitals and nursing note reviewed.  Constitutional:      General: He is in acute distress.     Appearance: Normal appearance. He is ill-appearing and toxic-appearing.  HENT:     Head: Normocephalic and atraumatic.     Nose: Nose normal.  Eyes:     Conjunctiva/sclera: Conjunctivae normal.  Cardiovascular:     Rate and Rhythm: Tachycardia present.  Pulmonary:     Effort: Pulmonary effort is normal.     Comments: Tachypnea  Musculoskeletal:        General: Normal range of motion.     Cervical back: Normal range of motion.  Skin:    General: Skin is warm and dry.  Neurological:     Mental Status: He is alert.  Psychiatric:        Mood and Affect: Mood normal.      UC Treatments / Results  Labs (all labs ordered are listed, but only abnormal results are displayed) Labs Reviewed  URINE CULTURE  CULTURE, BLOOD (ROUTINE X 2)  CULTURE, BLOOD (ROUTINE X 2)  SARS CORONAVIRUS 2 BY RT PCR (HOSPITAL ORDER, [facility])  LACTIC ACID, PLASMA  LACTIC ACID, PLASMA  COMPREHENSIVE METABOLIC PANEL  CBC WITH DIFFERENTIAL/PLATELET  PROTIME-INR  APTT  URINALYSIS, ROUTINE W REFLEX MICROSCOPIC  BRAIN NATRIURETIC PEPTIDE  MAGNESIUM  SALICYLATE LEVEL  I-STAT CHEM 8, ED  TYPE AND SCREEN  TROPONIN I (HIGH SENSITIVITY)    EKG   Radiology No results found.  Procedures Procedures (including critical care time)  Medications Ordered in UC Medications  dextrose 50 % solution 50 mL (has no administration in time range)  magnesium sulfate IVPB 2 g 50 mL (has no administration in time range)  potassium chloride 10 mEq in 100 mL IVPB (has no administration in time range)  dextrose (D10W) 10% bolus 391 mL  (has no administration in time range)  magnesium sulfate IVPB 2 g 50 mL (has no administration in time range)    Initial Impression / Assessment and Plan / UC Course  I have reviewed the triage vital signs and the nursing notes.  Pertinent labs & imaging results that were available during my care of the patient were reviewed by me and considered in my medical decision making (see chart for details).     Patient very ill-appearing today with low blood pressure readings worse on the right arm. Inconsistency in readings right vs less.  Patient appears pale and very uncomfortable sitting in wheelchair in pain. Concern for aortic dissection based on blood pressure readings, pain that patient is describing and patient appearance. Calling 911 and sending to the ER emergency traffic.  Final Clinical Impressions(s) / UC Diagnoses   Final diagnoses:  Hypotension, unspecified hypotension type  Acute midline thoracic back pain  Dizziness  Weakness     Discharge Instructions     ER     ED Prescriptions    None     PDMP not reviewed this encounter.   Loura Halt A, NP 11/20/19 1521

## 2019-11-20 NOTE — Progress Notes (Signed)
Pharmacy Antibiotic Note  Travis [name] is a 64 y.o. male admitted on 11/20/2019 with sepsis.  Pharmacy has been consulted for Cefepime and Vancomycin dosing.      Temp (24hrs), Avg:99.6 F (37.6 C), Min:99.6 F (37.6 C), Max:99.6 F (37.6 C)  Recent Labs  Lab 11/20/19 1544 11/20/19 1547 11/20/19 1600  CREATININE  --  2.30* 2.49*  LATICACIDVEN 1.6  --   --     Estimated Creatinine Clearance: 33.5 mL/min (A) (by C-G formula based on SCr of 2.49 mg/dL (H)).    No Known Allergies  Antimicrobials this admission: 8/27 Cefepime >>  8/27 Vancomycin >>   Dose adjustments this admission: N/a  Microbiology results: Pending   Plan:  - Cefepime 2g IV q12h - Vancomycin 1500mg  IV x 1 dose  - Followed by Vancomycin 1000mg  IV q24h (nomogram dosing)  - Goal trough ~ 15 - Monitor patients renal function and urine output  - De-escalate ABX when appropriate   Thank you for allowing pharmacy to be a part of this patient's care.  Duanne Limerick PharmD. BCPS 11/20/2019 4:47 PM

## 2019-11-20 NOTE — Telephone Encounter (Signed)
Patient called in stating he has tried to go to the urgent care but that he cannot be seen without an appointment. Notified him of [facility] urgent care, as per [facility] LPN, and also [facility] urgent care per patient's request. Patient stated he would like to speak with [facility] as he feels like he is just going to wait till Monday to have follow up with [facility]. Stressed to patient it is important to follow instructions from nurses. Family member with him expressed understanding. Patient still requesting to speak with Rena. Please advise.

## 2019-11-20 NOTE — ED Provider Notes (Signed)
[facility] EMERGENCY DEPARTMENT Provider Note   CSN: [id number] Arrival date & time: 11/20/19  1443     History Chief Complaint  Patient presents with  . Back Pain    Travis [name] is a 64 y.o. male with history of CAD, CKD, CHF last EF 35 to 40%, alcoholism reportedly sober x1 month presents to the ED today for evaluation of hypotension and back pain.  Patient presented to urgent care today complaining of severe 10/10 lower back pain, shaking chills and eye pain.  States that pain is gradually worsened since its interval and is reassuringly neuro intact.  There noted to be hypotensive to 60 systolic and was sent here emergently for further evaluation.  On arrival, access obtained and patient ministered fluids, examination significant for mild jaundice, lower back pain without any specific distribution generalized tremors and hypotension.  The history is provided by the patient.  Illness Quality:  Lower back pain Severity:  Severe Onset quality:  Gradual Duration:  3 days Timing:  Constant Progression:  Worsening Chronicity:  New Associated symptoms: fatigue and headaches   Associated symptoms: no abdominal pain, no chest pain, no cough, no fever, no rash, no shortness of breath and no vomiting        Past Medical History:  Diagnosis Date  . Alcohol abuse   . 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 05/2019 Echo: EF 35-40%.  . Allergy   . Asthma   . 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 Chronic anemia   . Clotting disorder ([place])   . Diabetes mellitus, type 2 ([place])    pt reports his DM is gone  . GI bleed    15 years ago  . Gout   . Hemoptysis    secondary to pulmonary edema  . HFrEF (heart failure with reduced ejection fraction) ([place])    a. 12/2010 Echo: EF 20-25%; b 08/2013 Echo: EF 45-50%; c. 01/2015 Echo: EF 25-30%; d. 07/2016 Echo: EF 35-40%; e. 05/2019 Echo: EF 35-40%, glob HK. Mild LVH. Gr1 DD. Nl RV fxn.  .  Hyperlipidemia   . Hypertension   . Nonischemic cardiomyopathy ([place])   . Nonobstructive Coronary artery disease    a. 01/2015 Cath: LM nl, LAD 30p/m, LCX nl, RCA 10p/m, RPDA min irregs.  . Osteoarthritis   . Pneumonia   . TIA (transient ischemic attack)   . Tobacco abuse   . Vision loss    peripherial vision only left eye.[place]  artery occusion     Patient Active Problem List   Diagnosis Date Noted  . Hypotension 11/20/2019  . Alcohol use 11/20/2019  . Fever of unknown origin 11/20/2019  . Anemia 11/20/2019  . CKD (chronic kidney disease), stage III 11/20/2019  . TIA (transient ischemic attack) 11/08/2019  . Osteoarthritis 05/09/2016  . COPD (chronic obstructive pulmonary disease) ([place]) 05/09/2016  . Essential hypertension 04/18/2015  . Literacy level of illiterate 06/04/2014  . 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 Cardiomyopathy, nonischemic ([place]) 03/15/2013  . Chronic systolic heart failure ([place]) 01/05/2011  . Tobacco use 01/05/2011  . Diabetes ([place]) 11/30/2008  . HLD (hyperlipidemia) 11/30/2008  . Gout 11/30/2008    Past Surgical History:  Procedure Laterality Date  . CARDIAC CATHETERIZATION    . CARDIAC CATHETERIZATION N/A 01/28/2015   Procedure: Left Heart Cath and Coronary Angiography;  Surgeon: Wellington Hampshire, MD;  Location: [facility];  Service: Cardiovascular;  Laterality: N/A;  . COLONOSCOPY    .  HIP ARTHROPLASTY Right 03/15/2013   Procedure: ARTHROPLASTY BIPOLAR HIP;  Surgeon: [name], MD;  Location: [facility];  Service: Orthopedics;  Laterality: Right;  . TOTAL SHOULDER ARTHROPLASTY Right 09/01/2015   Procedure: RIGHT TOTAL SHOULDER ARTHROPLASTY;  Surgeon: [name], MD;  Location: [facility];  Service: Orthopedics;  Laterality: Right;       Family History  Problem Relation Age of Onset  . Diabetes Mother   . Hypertension Mother   . Diabetes Father   . Hypertension Father   . Diabetes Sister   . Dementia  Brother   . Cancer Neg Hx   . Heart disease Neg Hx   . Stroke Neg Hx   . Colon cancer Neg Hx   . Esophageal cancer Neg Hx   . Rectal cancer Neg Hx   . Stomach cancer Neg Hx     Social History   Tobacco Use  . Smoking status: Current Every Day Smoker    Packs/day: 0.50    Years: 30.00    Pack years: 15.00    Types: Cigarettes  . Smokeless tobacco: Never Used  Substance Use Topics  . Alcohol use: Yes    Alcohol/week: 1.0 standard drink    Types: 1 Glasses of wine per week    Comment: occasionally  . Drug use: Yes    Types: Marijuana    Home Medications Prior to Admission medications   Medication Sig Start Date End Date Taking? Authorizing Provider  albuterol (PROVENTIL HFA;VENTOLIN HFA) 108 (90 BASE) MCG/ACT inhaler Inhale 2 puffs into the lungs every 6 (six) hours as needed for wheezing or shortness of breath. Take 2 puffs every 5-10 minutes up to 6 puffs total over 15 minutes when needed.  Use with a spacer. 01/24/15  Yes Ahmed Prima, MD  allopurinol (ZYLOPRIM) 100 MG tablet Take 2 tablets (200 mg total) by mouth daily. 01/23/19  Yes [name], NP  aspirin EC 325 MG tablet Take 162.5 mg by mouth daily.   Yes [provider]  atorvastatin (LIPITOR) 20 MG tablet Take 1 tablet (20 mg total) by mouth daily. 06/19/17 11/07/20 Yes Wellington Hampshire, MD  carvedilol (COREG) 3.125 MG tablet Take 1 tablet (3.125 mg total) by mouth 2 (two) times daily with a meal. 11/09/19  Yes Adhikari, Tamsen Meek, MD  celecoxib (CELEBREX) 100 MG capsule Take 1 capsule (100 mg total) by mouth 2 (two) times daily. 09/09/19  Yes Baity, Coralie Keens, NP  cetirizine (ZYRTEC) 10 MG tablet TAKE 1 TABLET BY MOUTH DAILY Patient taking differently: Take 10 mg by mouth daily.  07/22/19  Yes Baity, Coralie Keens, NP  cyclobenzaprine (FLEXERIL) 10 MG tablet Take 1 tablet (10 mg total) by mouth 3 (three) times daily as needed for muscle spasms. 07/30/19  Yes Baity, Coralie Keens, NP  ENTRESTO 97-103 MG TAKE 1 TABLET BY  MOUTH TWICE (2) DAILY Patient taking differently: Take 1 tablet by mouth 2 (two) times daily.  05/27/19  Yes Hackney, Tina A, FNP  fenofibrate (TRICOR) 145 MG tablet TAKE 1 TABLET BY MOUTH ONCE A DAY Patient taking differently: Take 145 mg by mouth daily.  10/20/19  Yes Baity, Coralie Keens, NP  fluticasone (FLONASE) 50 MCG/ACT nasal spray Place 1 spray into both nostrils 2 (two) times daily. Patient taking differently: Place 1 spray into both nostrils daily as needed for allergies or rhinitis.  03/29/16  Yes Cuthriell, Charline Bills, PA-C  folic acid (FOLVITE) 1 MG tablet Take 1 tablet (1 mg total) by  mouth daily. Patient taking differently: Take 0.5 mg by mouth daily.  09/20/16  Yes Hackney, Tina A, FNP  lidocaine (LIDODERM) 5 % Place 1 patch onto the skin daily. Remove & Discard patch within 12 hours or as directed by MD Patient taking differently: Place 1 patch onto the skin daily as needed (pain). Remove & Discard patch within 12 hours or as directed by MD 07/30/19  Yes [name], NP  Multiple Vitamin (MULTIVITAMIN WITH MINERALS) TABS tablet Take 1 tablet by mouth daily. Men's One a Day   Yes [provider]  Omega-3 Fatty Acids (FISH OIL PO) Take 1 capsule by mouth daily.   Yes [provider]  triamcinolone cream (KENALOG) 0.1 % APPLY TO AFFECTED AREAS TWICE A DAY AS NEEDED. AVOID FACE,GROIN, OR UNDERARMS Patient taking differently: Apply 1 application topically 2 (two) times daily as needed (eczema).  10/01/19  Yes Baity, Coralie Keens, NP  baclofen (LIORESAL) 10 MG tablet Take 1 tablet (10 mg total) by mouth 3 (three) times daily as needed for muscle spasms (sedation caution). Patient not taking: Reported on 11/20/2019 06/25/19   Tonia Ghent, MD    Allergies    Patient has no known allergies.  Review of Systems   Review of Systems  Constitutional: Positive for chills and fatigue. Negative for fever.  HENT: Negative for facial swelling and voice change.   Eyes: Positive for pain.  Negative for redness and visual disturbance.  Respiratory: Negative for cough and shortness of breath.   Cardiovascular: Negative for chest pain and palpitations.  Gastrointestinal: Negative for abdominal pain and vomiting.  Genitourinary: Negative for difficulty urinating and dysuria.  Musculoskeletal: Positive for back pain. Negative for gait problem and joint swelling.  Skin: Negative for rash and wound.  Neurological: Positive for tremors, weakness and headaches. Negative for dizziness.  Psychiatric/Behavioral: Negative for confusion and suicidal ideas.    Physical Exam Updated Vital Signs BP 102/63   Pulse (!) 106   Temp (!) 103 F (39.4 C) (Oral)   Resp (!) 28   SpO2 100%   Physical Exam Constitutional:      Appearance: He is ill-appearing and toxic-appearing.  HENT:     Head: Normocephalic and atraumatic.     Mouth/Throat:     Mouth: Mucous membranes are moist.     Pharynx: Oropharynx is clear.  Eyes:     General: Scleral icterus present.     Extraocular Movements: Extraocular movements intact.     Pupils: Pupils are equal, round, and reactive to light.  Cardiovascular:     Rate and Rhythm: Regular rhythm. Tachycardia present.     Comments: Peripheral pulses faint but equal. Pulmonary:     Effort: Pulmonary effort is normal. No respiratory distress.  Abdominal:     General: There is no distension.     Tenderness: There is no abdominal tenderness.  Musculoskeletal:        General: Tenderness present. No deformity.     Cervical back: Normal range of motion and neck supple.     Comments: General soreness throughout the lower back  Neurological:     General: No focal deficit present.     Mental Status: He is alert.     Sensory: No sensory deficit.     Motor: No weakness.  Psychiatric:        Mood and Affect: Mood normal.        Behavior: Behavior normal.     ED Results / Procedures / Treatments  Labs (all labs ordered are listed, but only abnormal results  are displayed) Labs Reviewed  URINALYSIS, ROUTINE W REFLEX MICROSCOPIC - Abnormal; Notable for the following components:      Result Value   APPearance HAZY (*)    Specific Gravity, Urine 1.036 (*)    Hgb urine dipstick SMALL (*)    Leukocytes,Ua TRACE (*)    Bacteria, UA RARE (*)    All other components within normal limits  BRAIN NATRIURETIC PEPTIDE - Abnormal; Notable for the following components:   B Natriuretic Peptide 365.0 (*)    All other components within normal limits  CBC WITH DIFFERENTIAL/PLATELET - Abnormal; Notable for the following components:   RBC 1.96 (*)    Hemoglobin 6.6 (*)    HCT 21.4 (*)    MCV 109.2 (*)    RDW 16.0 (*)    All other components within normal limits  COMPREHENSIVE METABOLIC PANEL - Abnormal; Notable for the following components:   Sodium 132 (*)    CO2 20 (*)    BUN 29 (*)    Creatinine, Ser 2.49 (*)    Calcium 8.1 (*)    Total Protein 5.0 (*)    Albumin 2.4 (*)    Alkaline Phosphatase 27 (*)    GFR calc non Af Amer 26 (*)    GFR calc Af Amer 31 (*)    All other components within normal limits  SALICYLATE LEVEL - Abnormal; Notable for the following components:   Salicylate Lvl <1.3 (*)    All other components within normal limits  I-STAT CHEM 8, ED - Abnormal; Notable for the following components:   Sodium 134 (*)    BUN 34 (*)    Creatinine, Ser 2.30 (*)    TCO2 20 (*)    Hemoglobin 7.5 (*)    HCT 22.0 (*)    All other components within normal limits  SARS CORONAVIRUS 2 BY RT PCR (HOSPITAL ORDER, [facility])  URINE CULTURE  CULTURE, BLOOD (ROUTINE X 2)  CULTURE, BLOOD (ROUTINE X 2)  RESPIRATORY PANEL BY PCR  LACTIC ACID, PLASMA  LACTIC ACID, PLASMA  CBC WITH DIFFERENTIAL/PLATELET  IRON AND TIBC  FERRITIN  COMPREHENSIVE METABOLIC PANEL  CBC  RETICULOCYTES  LACTATE DEHYDROGENASE  HAPTOGLOBIN  APTT  PROTIME-INR  SEDIMENTATION RATE  C-REACTIVE PROTEIN  EPSTEIN BARR VRS(EBV DNA BY PCR)    CORTISOL-AM, BLOOD  RAPID URINE DRUG SCREEN, HOSP PERFORMED  ETHANOL  I-STAT CHEM 8, ED  CBG MONITORING, ED  TYPE AND SCREEN  TYPE AND SCREEN  PREPARE RBC (CROSSMATCH)  TROPONIN I (HIGH SENSITIVITY)    EKG EKG Interpretation  Date/Time:  Friday November 20 2019 14:51:17 EDT Ventricular Rate:  96 PR Interval:    QRS Duration: 110 QT Interval:  329 QTC Calculation: 416 R Axis:   -35 Text Interpretation: Atrial fibrillation Left axis deviation Borderline T abnormalities, lateral leads since last tracing no significant change QT interval normal Confirmed by Noemi Chapel [phone]) on 11/20/2019 3:34:38 PM   Radiology DG Chest Port 1 View  Result Date: 11/20/2019 CLINICAL DATA:  Questionable sepsis. EXAM: PORTABLE CHEST 1 VIEW COMPARISON:  11/08/2019 FINDINGS: Heart and mediastinal contours are within normal limits. No focal opacities or effusions. No acute bony abnormality. IMPRESSION: No active disease. Electronically Signed   By: Rolm Baptise M.D.   On: 11/20/2019 15:53   CT Angio Chest/Abd/Pel for Dissection W and/or Wo Contrast  Result Date: 11/20/2019 CLINICAL DATA:  Abdominal pain question aortic  dissection EXAM: CT ANGIOGRAPHY CHEST, ABDOMEN AND PELVIS TECHNIQUE: Non-contrast CT of the chest was initially obtained. Multidetector CT imaging through the chest, abdomen and pelvis was performed using the standard protocol during bolus administration of intravenous contrast. Multiplanar reconstructed images and MIPs were obtained and reviewed to evaluate the vascular anatomy. CONTRAST:  155mL OMNIPAQUE IOHEXOL 350 MG/ML SOLN IV COMPARISON:  CT chest abdomen pelvis 07/26/2019 FINDINGS: CTA CHEST FINDINGS Cardiovascular: Scattered atherosclerotic calcifications aorta, proximal great vessels, and coronary arteries. Aorta normal caliber. No intramural hematoma on precontrast imaging. Normal aortic enhancement following contrast without evidence of dissection. Heart unremarkable. No pericardial  effusion. Pulmonary arteries grossly patent on non targeted exam. Mediastinum/Nodes: Base of cervical region normal appearance. Esophagus unremarkable. Multiple normal and upper normal sized mediastinal lymph nodes. 11 mm RIGHT paratracheal node image 59, unchanged. 12 mm RIGHT paratracheal node image 52, previously 10 mm. No hilar or axillary adenopathy. BILATERAL gynecomastia. Lungs/Pleura: Linear subsegmental atelectasis BILATERAL lower lobes. Lungs otherwise clear. No infiltrate, pleural effusion or pneumothorax. Musculoskeletal: RIGHT shoulder prosthesis. Degenerative changes LEFT glenohumeral joint. Degenerative disc disease changes lower cervical spine. Review of the MIP images confirms the above findings. CTA ABDOMEN AND PELVIS FINDINGS VASCULAR Aorta: Scattered atherosclerotic calcifications abdominal aorta. Minimal eccentric noncalcified plaque. Normal caliber without aneurysm or dissection. Celiac: Widely patent SMA: Widely patent Renals: Patent BILATERAL renal arteries. Accessory renal artery to upper pole LEFT kidney. IMA: Patent Inflow: Scattered atherosclerotic calcifications in the iliac arteries and common femoral arteries without high-grade stenosis Veins: Predominately unopacified at time of CTA imaging Review of the MIP images confirms the above findings. NON-VASCULAR Hepatobiliary: Contracted gallbladder with single tiny gallstone. Liver unremarkable. Pancreas: Normal appearance Spleen: Normal appearance Adrenals/Urinary Tract: Adrenal glands normal appearance. Cortical thinning in the kidneys. Tiny LEFT renal cyst. Kidneys and ureters otherwise unremarkable. Bladder decompressed. Stomach/Bowel: Scattered colonic diverticula greatest at ascending colon. No evidence of diverticulitis. Normal appendix. Stomach and bowel loops otherwise normal appearance. Lymphatic: No adenopathy Reproductive: Unremarkable prostate gland and seminal vesicles Other: No free air or free fluid. Small umbilical hernia  containing fat. No inguinal hernias. No inflammatory process. Musculoskeletal: RIGHT hip prosthesis. Soft tissue calcifications adjacent to proximal RIGHT femur. Bones demineralized with degenerative disc disease changes of the lumbar spine. Review of the MIP images confirms the above findings. IMPRESSION: Atherosclerotic calcifications of aorta, proximal great vessels, and coronary arteries. No evidence of aortic aneurysm or dissection. Linear subsegmental atelectasis BILATERAL lower lobes. Scattered colonic diverticulosis without evidence of diverticulitis. Small umbilical hernia containing fat. Cholelithiasis. Nonspecific minimally enlarged mediastinal lymph nodes little changed from 07/26/2019. Aortic Atherosclerosis (ICD10-I70.0). Electronically Signed   By: Lavonia Dana M.D.   On: 11/20/2019 16:02    Procedures Procedures (including critical care time)  Medications Ordered in ED Medications  dextrose 50 % solution 50 mL (0 mLs Intravenous Hold 11/20/19 1620)  magnesium sulfate IVPB 2 g 50 mL (2 g Intravenous Not Given 11/20/19 1620)  potassium chloride 10 mEq in 100 mL IVPB (0 mEq Intravenous Hold 11/20/19 1620)  dextrose (D10W) 10% bolus 391 mL (391 mLs Intravenous Not Given 11/20/19 1620)  lactated ringers infusion ( Intravenous New Bag/Given 11/20/19 1621)  vancomycin (VANCOREADY) IVPB 500 mg/100 mL (0 mg Intravenous Stopped 11/20/19 1834)    Followed by  vancomycin (VANCOCIN) IVPB 1000 mg/200 mL premix (has no administration in time range)  ceFEPIme (MAXIPIME) 2 g in sodium chloride 0.9 % 100 mL IVPB (has no administration in time range)  0.9 %  sodium chloride infusion (Manually program  via Guardrails IV Fluids) (has no administration in time range)  acetaminophen (TYLENOL) tablet 650 mg (650 mg Oral Given 11/20/19 1935)  allopurinol (ZYLOPRIM) tablet 200 mg (has no administration in time range)  aspirin EC tablet 162.5 mg (has no administration in time range)  atorvastatin (LIPITOR) tablet  20 mg (has no administration in time range)  fenofibrate tablet 160 mg (has no administration in time range)  folic acid (FOLVITE) tablet 0.5 mg (has no administration in time range)  albuterol (PROVENTIL) (2.5 MG/3ML) 0.083% nebulizer solution 2.5 mg (has no administration in time range)  celecoxib (CELEBREX) capsule 100 mg (has no administration in time range)  magnesium sulfate IVPB 2 g 50 mL (0 g Intravenous Stopped 11/20/19 1644)  iohexol (OMNIPAQUE) 350 MG/ML injection 100 mL (100 mLs Intravenous Contrast Given 11/20/19 1536)  ceFEPIme (MAXIPIME) 2 g in sodium chloride 0.9 % 100 mL IVPB (0 g Intravenous Stopped 11/20/19 1644)  vancomycin (VANCOCIN) IVPB 1000 mg/200 mL premix (0 mg Intravenous Stopped 11/20/19 1734)  lactated ringers bolus 1,000 mL (0 mLs Intravenous Stopped 11/20/19 1734)  lactated ringers bolus 1,500 mL (0 mLs Intravenous Stopped 11/20/19 1734)    ED Course  I have reviewed the triage vital signs and the nursing notes.  Pertinent labs & imaging results that were available during my care of the patient were reviewed by me and considered in my medical decision making (see chart for details).  Clinical Course as of Nov 19 2357  Fri Nov 20, 2019  1601 Change from prior, will follow up formal labs.  Hemoglobin(!): 7.5 [JR]  1602 Consistent with patient's baseline  Creatinine(!): 2.30 [JR]  1606 Reassuringly no evidence of dissection, no obvious sources of infection on imaging either.  CT Angio Chest/Abd/Pel for Dissection W and/or Wo Contrast [JR]  1608 Glucose-Capillary: 87 [JR]    Clinical Course User Index [JR] Renold Genta, MD   MDM Rules/Calculators/A&P                          EKG findings by my read: Compared to prior: 11/09/2019.  Rate: 96 rhythm: Atrial fibrillation Axis: appropriate  PR: N/A QRS: 110 QTc: 416.  Atrial fibrillation, otherwise no evidence of ischemia or arrhythmia, nor any other pathologic findings concerning considering patient  presentation. Findings discussed with attending who agrees.  Differential diagnosis considered: Sepsis, aortic dissection, AAA, dehydration, cardiogenic shock  Patient presenting with 3 days of severe back pain, shaking chills and malaise, originally presenting to urgent care was noted be hypotensive, presented 60 systolic and shocky appearing.  Broad shock work-up initiated including a CT dissection study, pancultures the patient missed her 2 L of fluid after access obtained.   CTA dissection study without any obvious evidence of dissection, sepsis work-up initiated and patient to be antibiosis with vancomycin and cefepime.  Initial Chem-8 showing hyperglycemia, hypokalemia consistent with dilution, will repeat.  Will hold glucose for now, minister magnesium given history of alcoholism.  Patient given vancomycin and cefepime due to concern for sepsis, given a total of 2.5 L of fluid for blood pressure is improved however shortly not normal.  CBC showing hemoglobin of 6.6 on review this has been gradually declining through time, patient denies any hematochezia or melena and had a recent negative FOBT, fine no need to repeat.  We will add on ferritin, iron profile, 1 unit PRBCs ordered for administration.  Covid test negative.  Hospital medicine consulted for admission, patient admitted to their service.  Final Clinical Impression(s) / ED Diagnoses Final diagnoses:  Sepsis without acute organ dysfunction, due to unspecified organism [facility])    Rx / DC Orders ED Discharge Orders    None     Labs, studies and imaging reviewed by myself and considered in medical decision making if ordered. Imaging interpreted by radiology. Pt was discussed with my attending, Dr. Sabra Heck  Electronically signed by:  Roderic Palau [place]8/27/202111:59 PM      Renold Genta, MD 11/20/19 2359    Noemi Chapel, MD 11/23/19 1131

## 2019-11-20 NOTE — ED Notes (Signed)
Patient is being discharged from the Urgent Care and sent to the Emergency Department via EMS . Per Tressia Miners, NP, patient is in need of higher level of care due to needing higher level of care. Patient is aware and verbalizes understanding of plan of care.  Vitals:   11/20/19 1415  BP: (!) 61/50  Pulse: (!) 103  Resp: (!) 21  Temp: 99.6 F (37.6 C)  SpO2: 100%

## 2019-11-20 NOTE — Telephone Encounter (Signed)
Went to call pt back and he is in hospital, FYI to PCP and Dr. Silvio Pate

## 2019-11-20 NOTE — ED Provider Notes (Signed)
Patient with severe back pain and hypotension. High concern for aortic dissection.  Hx of CKD, however, feel benefit outweighs risk for iodinated contrast.  Labs, studies and imaging reviewed by myself and considered in medical decision making if ordered. Imaging interpreted by radiology. Pt was discussed with my attending, Dr. Ron Parker  Electronically signed by:  Lerae Langham [place]8/27/20213:05 PM       Renold Genta, MD 11/20/19 [place], Eric C, MD 11/20/19 [phone]

## 2019-11-20 NOTE — Discharge Instructions (Addendum)
Peptic Ulcer  A peptic ulcer is a painful sore in the lining of your stomach or the first part of your small intestine. What are the causes? Common causes of this condition include:  An infection.  Using certain pain medicines too often or too much. What increases the risk? You are more likely to get this condition if you:  Smoke.  Have a family history of ulcer disease.  Drink alcohol.  Have been hospitalized in an intensive care unit (ICU). What are the signs or symptoms? Symptoms include:  Burning pain in the area between the chest and the belly button. The pain may: ? Not go away (be persistent). ? Be worse when your stomach is empty. ? Be worse at night.  Heartburn.  Feeling sick to your stomach (nauseous) and throwing up (vomiting).  Bloating. If the ulcer results in bleeding, it can cause you to:  Have poop (stool) that is black and looks like tar.  Throw up bright red blood.  Throw up material that looks like coffee grounds. How is this treated? Treatment for this condition may include:  Stopping things that can cause the ulcer, such as: ? Smoking. ? Using pain medicines.  Medicines to reduce stomach acid.  Antibiotic medicines if the ulcer is caused by an infection.  A procedure that is done using a small, flexible tube that has a camera at the end (upper endoscopy). This may be done if you have a bleeding ulcer.  Surgery. This may be needed if: ? You have a lot of bleeding. ? The ulcer caused a hole somewhere in the digestive system. Follow these instructions at home:  Do not drink alcohol if your doctor tells you not to drink.  Limit how much caffeine you take in.  Do not use any products that contain nicotine or tobacco, such as cigarettes, e-cigarettes, and chewing tobacco. If you need help quitting, ask your doctor.  Take over-the-counter and prescription medicines only as told by your doctor. ? Do not stop or change your medicines unless  you talk with your doctor about it first. ? Do not take aspirin, ibuprofen, or other NSAIDs unless your doctor told you to do so.  Keep all follow-up visits as told by your doctor. This is important. Contact a doctor if:  You do not get better in 7 days after you start treatment.  You keep having an upset stomach (indigestion) or heartburn. Get help right away if:  You have sudden, sharp pain in your belly (abdomen).  You have belly pain that does not go away.  You have bloody poop (stool) or black, tarry poop.  You throw up blood. It may look like coffee grounds.  You feel light-headed or feel like you may pass out (faint).  You get weak.  You get sweaty or feel sticky and cold to the touch (clammy). Summary  Symptoms of a peptic ulcer include burning pain in the area between the chest and the belly button.  Take medicines only as told by your doctor.  Limit how much alcohol and caffeine you have.  Keep all follow-up visits as told by your doctor. This information is not intended to replace advice given to you by your health care provider. Make sure you discuss any questions you have with your health care provider. Document Revised: 09/17/2017 Document Reviewed: 09/17/2017 Elsevier Patient Education  [place]. Gastritis, Adult  Gastritis is swelling (inflammation) of the stomach. Gastritis can develop quickly (acute). It can also develop  slowly over time (chronic). It is important to get help for this condition. If you do not get help, your stomach can bleed, and you can get sores (ulcers) in your stomach. What are the causes? This condition may be caused by:  Germs that get to your stomach.  Drinking too much alcohol.  Medicines you are taking.  Too much acid in the stomach.  A disease of the intestines or stomach.  Stress.  An allergic reaction.  Crohn's disease.  Some cancer treatments (radiation). Sometimes the cause of this condition is not  known. What are the signs or symptoms? Symptoms of this condition include:  Pain in your stomach.  A burning feeling in your stomach.  Feeling sick to your stomach (nauseous).  Throwing up (vomiting).  Feeling too full after you eat.  Weight loss.  Bad breath.  Throwing up blood.  Blood in your poop (stool). How is this diagnosed? This condition may be diagnosed with:  Your medical history and symptoms.  A physical exam.  Tests. These can include: ? Blood tests. ? Stool tests. ? A procedure to look inside your stomach (upper endoscopy). ? A test in which a sample of tissue is taken for testing (biopsy). How is this treated? Treatment for this condition depends on what caused it. You may be given:  Antibiotic medicine, if your condition was caused by germs.  H2 blockers and similar medicines, if your condition was caused by too much acid. Follow these instructions at home: Medicines  Take over-the-counter and prescription medicines only as told by your doctor.  If you were prescribed an antibiotic medicine, take it as told by your doctor. Do not stop taking it even if you start to feel better. Eating and drinking   Eat small meals often, instead of large meals.  Avoid foods and drinks that make your symptoms worse.  Drink enough fluid to keep your pee (urine) pale yellow. Alcohol use  Do not drink alcohol if: ? Your doctor tells you not to drink. ? You are pregnant, may be pregnant, or are planning to become pregnant.  If you drink alcohol: ? Limit your use to:  0-1 drink a day for women.  0-2 drinks a day for men. ? Be aware of how much alcohol is in your drink. In the [place] one drink equals one 12 oz bottle of beer (355 mL), one 5 oz glass of wine (148 mL), or one 1 oz glass of hard liquor (44 mL). General instructions  Talk with your doctor about ways to manage stress. You can exercise or do deep breathing, meditation, or yoga.  Do not smoke or  use products that have nicotine or tobacco. If you need help quitting, ask your doctor.  Keep all follow-up visits as told by your doctor. This is important. Contact a doctor if:  Your symptoms get worse.  Your symptoms go away and then come back. Get help right away if:  You throw up blood or something that looks like coffee grounds.  You have black or dark red poop.  You throw up any time you try to drink fluids.  Your stomach pain gets worse.  You have a fever.  You do not feel better after one week. Summary  Gastritis is swelling (inflammation) of the stomach.  You must get help for this condition. If you do not get help, your stomach can bleed, and you can get sores (ulcers).  This condition is diagnosed with medical history, physical exam,  or tests.  You can be treated with medicines for germs or medicines to block too much acid in your stomach. This information is not intended to replace advice given to you by your health care provider. Make sure you discuss any questions you have with your health care provider. Document Revised: 07/30/2017 Document Reviewed: 07/30/2017 Elsevier Patient Education  [place].

## 2019-11-20 NOTE — ED Notes (Signed)
Hospital bed ordered for pt.

## 2019-11-20 NOTE — ED Provider Notes (Signed)
[facility]    CSN: [id number] Arrival date & time: 11/20/19  1332      History   Chief Complaint Chief Complaint  Patient presents with  . Back Pain  . Eye Pain  . Dizziness    HPI Travis [name] is a 64 y.o. male.   Patient is a 64 year old male past medical history of alcohol abuse, alcoholic cardiomyopathy, allergy, asthma, chronic anemia, diabetes, GI bleed, gout, heart failure, pneumonia, TIA. He presents today with pain between shoulder blades, lower back pain, dizziness, weakness, headache. Symptoms been constant and worsening. He was seen at the heart failure clinic approximate 3 days ago and noted per chart blood pressures 85/52. Symptoms have been worsening over the past 3 days with some shortness of breath. Severe headache and pressure behind eyes. Loss of appetite and unable to eat. Was seen and admitted to the hospital on 11/08/2019 for acute kidney injury and TIA. Was also hypotensive at that time. Patient presents today with blood pressure readings different and right versus left arm. Left arm reading normalized around 98/56 and right arm reading 61/50. Patient is sitting in wheelchair, pale, in obvious discomfort. Otherwise alert and talking.       Past Medical History:  Diagnosis Date  . Alcohol abuse   . 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 05/2019 Echo: EF 35-40%.  . Allergy   . Asthma   . 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 Chronic anemia   . Clotting disorder ([place])   . Diabetes mellitus, type 2 ([place])    pt reports his DM is gone  . GI bleed    15 years ago  . Gout   . Hemoptysis    secondary to pulmonary edema  . HFrEF (heart failure with reduced ejection fraction) ([place])    a. 12/2010 Echo: EF 20-25%; b 08/2013 Echo: EF 45-50%; c. 01/2015 Echo: EF 25-30%; d. 07/2016 Echo: EF 35-40%; e. 05/2019 Echo: EF 35-40%, glob HK. Mild LVH. Gr1 DD. Nl RV fxn.  . Hyperlipidemia   . Hypertension   . Nonischemic cardiomyopathy  ([place])   . Nonobstructive Coronary artery disease    a. 01/2015 Cath: LM nl, LAD 30p/m, LCX nl, RCA 10p/m, RPDA min irregs.  . Osteoarthritis   . Pneumonia   . TIA (transient ischemic attack)   . Tobacco abuse   . Vision loss    peripherial vision only left eye.[place]  artery occusion     Patient Active Problem List   Diagnosis Date Noted  . TIA (transient ischemic attack) 11/08/2019  . Osteoarthritis 05/09/2016  . COPD (chronic obstructive pulmonary disease) ([place]) 05/09/2016  . Essential hypertension 04/18/2015  . Literacy level of illiterate 06/04/2014  . 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 Cardiomyopathy, nonischemic ([place]) 03/15/2013  . Chronic systolic heart failure ([place]) 01/05/2011  . Tobacco use 01/05/2011  . Diabetes ([place]) 11/30/2008  . HLD (hyperlipidemia) 11/30/2008  . Gout 11/30/2008    Past Surgical History:  Procedure Laterality Date  . CARDIAC CATHETERIZATION    . CARDIAC CATHETERIZATION N/A 01/28/2015   Procedure: Left Heart Cath and Coronary Angiography;  Surgeon: Wellington Hampshire, MD;  Location: [facility];  Service: Cardiovascular;  Laterality: N/A;  . COLONOSCOPY    . HIP ARTHROPLASTY Right 03/15/2013   Procedure: ARTHROPLASTY BIPOLAR HIP;  Surgeon: [name], MD;  Location: [facility];  Service: Orthopedics;  Laterality: Right;  . TOTAL SHOULDER ARTHROPLASTY Right 09/01/2015  Procedure: RIGHT TOTAL SHOULDER ARTHROPLASTY;  Surgeon: [name], MD;  Location: [facility];  Service: Orthopedics;  Laterality: Right;       Home Medications    Prior to Admission medications   Medication Sig Start Date End Date Taking? Authorizing Provider  albuterol (PROVENTIL HFA;VENTOLIN HFA) 108 (90 BASE) MCG/ACT inhaler Inhale 2 puffs into the lungs every 6 (six) hours as needed for wheezing or shortness of breath. Take 2 puffs every 5-10 minutes up to 6 puffs total over 15 minutes when needed.  Use with a spacer. 01/24/15   Ahmed Prima, MD  allopurinol (ZYLOPRIM) 100 MG tablet Take 2 tablets (200 mg total) by mouth daily. 01/23/19   [name], NP  aspirin EC 325 MG tablet Take 162.5 mg by mouth daily.    [provider]  atorvastatin (LIPITOR) 20 MG tablet Take 1 tablet (20 mg total) by mouth daily. 06/19/17 11/07/20  Wellington Hampshire, MD  baclofen (LIORESAL) 10 MG tablet Take 1 tablet (10 mg total) by mouth 3 (three) times daily as needed for muscle spasms (sedation caution). Patient taking differently: Take 10 mg by mouth 3 (three) times daily as needed for muscle spasms.  06/25/19   Tonia Ghent, MD  carvedilol (COREG) 3.125 MG tablet Take 1 tablet (3.125 mg total) by mouth 2 (two) times daily with a meal. 11/09/19   Shelly Coss, MD  celecoxib (CELEBREX) 100 MG capsule Take 1 capsule (100 mg total) by mouth 2 (two) times daily. 09/09/19   [name], NP  cetirizine (ZYRTEC) 10 MG tablet TAKE 1 TABLET BY MOUTH DAILY Patient taking differently: Take 10 mg by mouth daily.  07/22/19   [name], NP  cyclobenzaprine (FLEXERIL) 10 MG tablet Take 1 tablet (10 mg total) by mouth 3 (three) times daily as needed for muscle spasms. 07/30/19   Baity, Coralie Keens, NP  ENTRESTO 97-103 MG TAKE 1 TABLET BY MOUTH TWICE (2) DAILY Patient taking differently: Take 1 tablet by mouth 2 (two) times daily.  05/27/19   Alisa Graff, FNP  fenofibrate (TRICOR) 145 MG tablet TAKE 1 TABLET BY MOUTH ONCE A DAY Patient taking differently: Take 145 mg by mouth daily.  10/20/19   [name], NP  fluticasone (FLONASE) 50 MCG/ACT nasal spray Place 1 spray into both nostrils 2 (two) times daily. Patient taking differently: Place 1 spray into both nostrils daily as needed for allergies or rhinitis.  03/29/16   Cuthriell, Charline Bills, PA-C  folic acid (FOLVITE) 1 MG tablet Take 1 tablet (1 mg total) by mouth daily. Patient taking differently: Take 0.5 mg by mouth daily.  09/20/16   Alisa Graff, FNP  lidocaine (LIDODERM) 5 %  Place 1 patch onto the skin daily. Remove & Discard patch within 12 hours or as directed by MD Patient taking differently: Place 1 patch onto the skin daily as needed (pain). Remove & Discard patch within 12 hours or as directed by MD 07/30/19   [name], NP  Multiple Vitamin (MULTIVITAMIN WITH MINERALS) TABS tablet Take 1 tablet by mouth daily. Men's One a Day    [provider]  Omega-3 Fatty Acids (FISH OIL PO) Take 1 capsule by mouth daily.    [provider]  spironolactone (ALDACTONE) 25 MG tablet Take 0.5 tablets (12.5 mg total) by mouth daily. 07/03/19   Alisa Graff, FNP  triamcinolone cream (KENALOG) 0.1 % APPLY TO AFFECTED AREAS TWICE A DAY AS NEEDED.  AVOID FACE,GROIN, OR UNDERARMS Patient taking differently: Apply 1 application topically 2 (two) times daily as needed.  10/01/19   [name], NP    Family History Family History  Problem Relation Age of Onset  . Diabetes Mother   . Hypertension Mother   . Diabetes Father   . Hypertension Father   . Diabetes Sister   . Dementia Brother   . Cancer Neg Hx   . Heart disease Neg Hx   . Stroke Neg Hx   . Colon cancer Neg Hx   . Esophageal cancer Neg Hx   . Rectal cancer Neg Hx   . Stomach cancer Neg Hx     Social History Social History   Tobacco Use  . Smoking status: Current Every Day Smoker    Packs/day: 0.50    Years: 30.00    Pack years: 15.00    Types: Cigarettes  . Smokeless tobacco: Never Used  Substance Use Topics  . Alcohol use: Yes    Alcohol/week: 1.0 standard drink    Types: 1 Glasses of wine per week    Comment: occasionally  . Drug use: Yes    Types: Marijuana     Allergies   Patient has no known allergies.   Review of Systems Review of Systems   Physical Exam Triage Vital Signs ED Triage Vitals  Enc Vitals Group     BP 11/20/19 1415 (!) 61/50     Pulse Rate 11/20/19 1415 (!) 103     Resp 11/20/19 1415 (!) 21     Temp 11/20/19 1415 99.6 F (37.6 C)     Temp  Source 11/20/19 1415 Oral     SpO2 11/20/19 1415 100 %     Weight --      Height --      Head Circumference --      Peak Flow --      Pain Score 11/20/19 1413 9     Pain Loc --      Pain Edu? --      Excl. in [place]? --    No data found.  Updated Vital Signs BP (!) 61/50 (BP Location: Right Arm)   Pulse (!) 103   Temp 99.6 F (37.6 C) (Oral)   Resp (!) 21   SpO2 100%   Visual Acuity Right Eye Distance:   Left Eye Distance:   Bilateral Distance:    Right Eye Near:   Left Eye Near:    Bilateral Near:     Physical Exam Vitals and nursing note reviewed.  Constitutional:      General: He is in acute distress.     Appearance: Normal appearance. He is ill-appearing and toxic-appearing.     Comments: Pale   HENT:     Head: Normocephalic and atraumatic.     Nose: Nose normal.  Cardiovascular:     Rate and Rhythm: Tachycardia present.  Pulmonary:     Effort: Pulmonary effort is normal.     Comments: Tachypnea Abdominal:     Palpations: Abdomen is soft.     Tenderness: There is no abdominal tenderness.  Musculoskeletal:        General: Normal range of motion.     Cervical back: Normal range of motion.  Skin:    General: Skin is warm and dry.  Neurological:     Mental Status: He is alert.  Psychiatric:        Mood and Affect: Mood normal.  UC Treatments / Results  Labs (all labs ordered are listed, but only abnormal results are displayed) Labs Reviewed - No data to display  EKG   Radiology No results found.  Procedures Procedures (including critical care time)  Medications Ordered in UC Medications - No data to display  Initial Impression / Assessment and Plan / UC Course  I have reviewed the triage vital signs and the nursing notes.  Pertinent labs & imaging results that were available during my care of the patient were reviewed by me and considered in my medical decision making (see chart for details).     64 year old male with significant  past medical history that presents today with hypertension, tachycardia, dyspnea, dizziness, severe back pain and headache.  Inconsistent blood pressures with less on the right than left.  Blood pressure readings on the left arm running in 67E systolic versus 60 systolic on left.  Patient appears very uncomfortable and is very ill-appearing. Will send to the ER via EMS Final Clinical Impressions(s) / UC Diagnoses   Final diagnoses:  Dizziness  Hypotension, unspecified hypotension type  Severe back pain  Severe headache   Discharge Instructions   None    ED Prescriptions    None     PDMP not reviewed this encounter.   Orvan July, NP 11/20/19 [phone]

## 2019-11-20 NOTE — Telephone Encounter (Signed)
I guess just try again to reach him before the end of the day

## 2019-11-20 NOTE — ED Triage Notes (Signed)
Pt bib ems from UC with central back pain x2 days, progressively worsening. Now presents with 10/10 pain. Pt pale on arrival. 100 palp and HR 104.

## 2019-11-20 NOTE — ED Notes (Signed)
Admitting provider bedside 

## 2019-11-20 NOTE — ED Notes (Signed)
Called EMS

## 2019-11-20 NOTE — ED Triage Notes (Signed)
Pt is here with bilateral eye pain, dizziness and back pain started 11/08/2019, pt went to [facility] to be evaluated the doctor informed him that he has low BP. Pt has been having dizzy spells and a migraine constantly for 3 days now.

## 2019-11-20 NOTE — Telephone Encounter (Signed)
[facility] - Client TELEPHONE ADVICE RECORD [facility] Patient Name: Travis [name] Gender: Male DOB: 06-May-1955 Age: 64 Y 10 M 8 D Return Phone Number: [phone] (Primary) [place] [address] Client [employer] [facility] - Client Client Site [place] - Day Physician [name] - NP Contact Type Call Who Is Calling Patient / Member / Family / Caregiver Call Type Triage / Clinical Relationship To Patient Self Return Phone Number [phone] (Primary) Chief Complaint Headache Reason for Call Symptomatic / Request for [place] says that he has a headache on the back of his eyes when moves his head, and pain when he moves his eyes. He had an episode 2 weeks ago and could not see past 5 feet and he went to the hospital. He is also a bit nauseaous. Translation No Nurse Assessment Nurse: Loreen Freud, RN, [place] Date/Time (Eastern Time): 11/20/2019 10:43:41 AM Confirm and document reason for call. If symptomatic, describe symptoms. ---Caller says that he has a headache on the back of his eyes when moves his head, and pain when he moves his eyes. He had an episode 2 weeks ago and could not see past 5 feet and he went to the hospital, they diagnosed him with "low blood". He is also having nausea. Has the patient had close contact with a person known or suspected to have the novel coronavirus illness OR traveled / lives in area with major community spread (including international travel) in the last 14 days from the onset of symptoms? * If Asymptomatic, screen for exposure and travel within the last 14 days. ---No Does the patient have any new or worsening symptoms? ---Yes Will a triage be completed? ---Yes Related visit to physician within the last 2 weeks? ---Yes Does the PT have any chronic conditions? (i.e. diabetes, asthma, this includes High risk  factors for pregnancy, etc.) ---Yes List chronic conditions. ---Sinus problems, heart problems Is this a behavioral health or substance abuse call? ---No Guidelines Guideline Title Affirmed Question Affirmed Notes Nurse Date/Time Eilene Ghazi Time) Eye Pain [1] Blurred vision AND [2] new or worsening [place], RN, [place] 11/20/2019 10:47:31 AM PLEASE NOTE: All timestamps contained within this report are represented as [place] Standard Time. CONFIDENTIALTY NOTICE: This fax transmission is intended only for the addressee. It contains information that is legally privileged, confidential or otherwise protected from use or disclosure. If you are not the intended recipient, you are strictly prohibited from reviewing, disclosing, copying using or disseminating any of this information or taking any action in reliance on or regarding this information. If you have received this fax in error, please notify us immediately by telephone so that we can arrange for its return to [place]. Phone: [phone], Toll-Free: [phone], Fax: [phone] Page: 2 of 2 Call Id: [id number] [place]. Time Eilene Ghazi Time) Disposition Final User 11/20/2019 10:51:08 AM Go to ED Now (or PCP triage) Yes [place], RN, [place] Caller Disagree/Comply Comply Caller Understands Yes PreDisposition Did not know what to do Care Advice Given Per Guideline GO TO ED NOW (OR PCP TRIAGE): * IF NO PCP (PRIMARY CARE PROVIDER) SECOND-LEVEL TRIAGE: You need to be seen within the next hour. Go to the [facility] at _____________ [place] as soon as you can. ANOTHER ADULT SHOULD DRIVE: * It is better and safer if another adult drives instead of you. CALL BACK IF: * You become worse. CARE ADVICE given per Eye Pain (Adult) guideline. Referrals GO TO FACILITY UNDECIDED

## 2019-11-20 NOTE — Telephone Encounter (Signed)
Left v/m returning pts call and requesting pt to cb.

## 2019-11-20 NOTE — ED Provider Notes (Signed)
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  He is treated with multiple medications including carvedilol, spironolactone, he does endorse being a very heavy alcohol user though he states he has not had much to drink in the last month.  The patient presents with intense lower back pain, shaking chills, feeling like his eyes hurt and comes from urgent care with a hypotension.  On exam the patient has weak peripheral pulses, good central pulses, no edema, he has pale conjunctive a, he appears slightly jaundiced, his mucous membranes are normal, his heart rate is borderline tachycardic without murmurs and his lungs are clear.  His abdomen is soft without pulsating mass.  He is able to move both legs spontaneously.  The patient is severely hypotensive and appears to have rigors, I suspect he has sepsis but dissection is definitely on the differential and will need to be ruled out given the location of this patient's pain, additionally stabilizing measures with IV fluids, Covid evaluation, pressors as needed, will need to be instituted.  Initial laboratory work-up shows hypokalemia and hypoglycemia, he has no reason to have either, D50 and magnesium and potassium have been ordered.  This patient is critically ill  .Critical Care Performed by: Noemi Chapel, MD Authorized by: Noemi Chapel, MD   Critical care provider statement:    Critical care time (minutes):  35   Critical care time was exclusive of:  Separately billable procedures and treating other patients and teaching time   Critical care was time spent personally by me on the following activities:  Blood draw for specimens, development of treatment plan with patient or surrogate, discussions with consultants, evaluation of patient's response to treatment, examination of patient, obtaining history  from patient or surrogate, ordering and performing treatments and interventions, ordering and review of laboratory studies, ordering and review of radiographic studies, pulse oximetry, re-evaluation of patient's condition and review of old charts   Final diagnoses:  Sepsis without acute organ dysfunction, due to unspecified organism [facility])      Noemi Chapel, MD 11/23/19 1128

## 2019-11-21 LAB — TYPE AND SCREEN
ABO/RH(D): O POS
Antibody Screen: NEGATIVE
Unit division: 0

## 2019-11-21 LAB — FERRITIN: Ferritin: 652 ng/mL — ABNORMAL HIGH (ref 24–336)

## 2019-11-21 LAB — SEDIMENTATION RATE: Sed Rate: 62 mm/hr — ABNORMAL HIGH (ref 0–16)

## 2019-11-21 LAB — RAPID URINE DRUG SCREEN, HOSP PERFORMED
Amphetamines: NOT DETECTED
Barbiturates: NOT DETECTED
Benzodiazepines: NOT DETECTED
Cocaine: NOT DETECTED
Opiates: NOT DETECTED
Tetrahydrocannabinol: POSITIVE — AB

## 2019-11-21 LAB — URINE CULTURE

## 2019-11-21 LAB — BPAM RBC
Blood Product Expiration Date: 202109222359
ISSUE DATE / TIME: 202108271840
Unit Type and Rh: 5100

## 2019-11-21 LAB — COMPREHENSIVE METABOLIC PANEL
ALT: 16 U/L (ref 0–44)
AST: 20 U/L (ref 15–41)
Albumin: 2.2 g/dL — ABNORMAL LOW (ref 3.5–5.0)
Alkaline Phosphatase: 26 U/L — ABNORMAL LOW (ref 38–126)
Anion gap: 10 (ref 5–15)
BUN: 23 mg/dL (ref 8–23)
CO2: 18 mmol/L — ABNORMAL LOW (ref 22–32)
Calcium: 8.2 mg/dL — ABNORMAL LOW (ref 8.9–10.3)
Chloride: 107 mmol/L (ref 98–111)
Creatinine, Ser: 1.72 mg/dL — ABNORMAL HIGH (ref 0.61–1.24)
GFR calc Af Amer: 48 mL/min — ABNORMAL LOW (ref >60)
GFR calc non Af Amer: 41 mL/min — ABNORMAL LOW (ref >60)
Glucose, Bld: 101 mg/dL — ABNORMAL HIGH (ref 70–99)
Potassium: 4.6 mmol/L (ref 3.5–5.1)
Sodium: 135 mmol/L (ref 135–145)
Total Bilirubin: 0.9 mg/dL (ref 0.3–1.2)
Total Protein: 4.9 g/dL — ABNORMAL LOW (ref 6.5–8.1)

## 2019-11-21 LAB — IRON AND TIBC
Iron: 16 ug/dL — ABNORMAL LOW (ref 45–182)
Saturation Ratios: 7 % — ABNORMAL LOW (ref 17.9–39.5)
TIBC: 239 ug/dL — ABNORMAL LOW (ref 250–450)
UIBC: 223 ug/dL

## 2019-11-21 LAB — CBC
HCT: 24.6 % — ABNORMAL LOW (ref 39.0–52.0)
Hemoglobin: 7.9 g/dL — ABNORMAL LOW (ref 13.0–17.0)
MCH: 32.9 pg (ref 26.0–34.0)
MCHC: 32.1 g/dL (ref 30.0–36.0)
MCV: 102.5 fL — ABNORMAL HIGH (ref 80.0–100.0)
Platelets: 175 10*3/uL (ref 150–400)
RBC: 2.4 MIL/uL — ABNORMAL LOW (ref 4.22–5.81)
RDW: 18.2 % — ABNORMAL HIGH (ref 11.5–15.5)
WBC: 4.5 10*3/uL (ref 4.0–10.5)
nRBC: 0 % (ref 0.0–0.2)

## 2019-11-21 LAB — LACTATE DEHYDROGENASE: LDH: 155 U/L (ref 98–192)

## 2019-11-21 LAB — RESPIRATORY PANEL BY PCR

## 2019-11-21 LAB — PROTIME-INR
INR: 1.3 — ABNORMAL HIGH (ref 0.8–1.2)
Prothrombin Time: 15.8 seconds — ABNORMAL HIGH (ref 11.4–15.2)

## 2019-11-21 LAB — CORTISOL-AM, BLOOD: Cortisol - AM: 7.4 ug/dL (ref 6.7–22.6)

## 2019-11-21 LAB — RETICULOCYTES
Immature Retic Fract: 14.1 % (ref 2.3–15.9)
RBC.: 2.42 MIL/uL — ABNORMAL LOW (ref 4.22–5.81)
Retic Count, Absolute: 19.8 10*3/uL (ref 19.0–186.0)
Retic Ct Pct: 0.8 % (ref 0.4–3.1)

## 2019-11-21 LAB — C-REACTIVE PROTEIN: CRP: 10.3 mg/dL — ABNORMAL HIGH (ref <1.0)

## 2019-11-21 LAB — ETHANOL: Alcohol, Ethyl (B): <10 mg/dL (ref <10)

## 2019-11-21 LAB — APTT: aPTT: 41 seconds — ABNORMAL HIGH (ref 24–36)

## 2019-11-21 MED ORDER — SODIUM CHLORIDE 0.9 % IV SOLN: Freq: Once | INTRAVENOUS | Status: DC

## 2019-11-21 MED ORDER — SODIUM CHLORIDE 0.9 % IV SOLN: Freq: Once | INTRAVENOUS | Status: AC

## 2019-11-21 MED ORDER — SODIUM CHLORIDE 0.9 % IV SOLN: INTRAVENOUS | Status: DC

## 2019-11-21 MED ORDER — SODIUM CHLORIDE 0.9 % IV SOLN
510.0000 mg | Freq: Once | INTRAVENOUS | Status: AC
  Administered 2019-11-21: 510 mg via INTRAVENOUS
  Filled 2019-11-21: qty 17

## 2019-11-21 MED ORDER — VANCOMYCIN HCL 750 MG/150ML IV SOLN
750.0000 mg | Freq: Two times a day (BID) | INTRAVENOUS | Status: DC
  Administered 2019-11-21 – 2019-11-22 (×3): 750 mg via INTRAVENOUS
  Filled 2019-11-21 (×3): qty 150

## 2019-11-21 NOTE — Progress Notes (Signed)
   11/21/19 1526  Assess: MEWS Score  Temp 98.2 F (36.8 C)  BP 93/64  Pulse Rate 89  ECG Heart Rate 89  Resp (!) 24  SpO2 100 %  O2 Device Room Air  Assess: MEWS Score  MEWS Temp 0  MEWS Systolic 1  MEWS Pulse 0  MEWS RR 1  MEWS LOC 0  MEWS Score 2  MEWS Score Color Yellow  Assess: if the MEWS score is Yellow or Red  Were vital signs taken at a resting state? Yes  Focused Assessment No change from prior assessment  Early Detection of Sepsis Score *See Row Information* Medium  MEWS guidelines implemented *See Row Information* Yes  Take Vital Signs  Increase Vital Sign Frequency  Yellow: Q 2hr X 2 then Q 4hr X 2, if remains yellow, continue Q 4hrs  Escalate  MEWS: Escalate Yellow: discuss with charge nurse/RN and consider discussing with provider and RRT  Notify: Charge Nurse/RN  Name of Charge Nurse/RN Notified Diona Browner, RN  Date Charge Nurse/RN Notified 11/21/19  Time Charge Nurse/RN Notified 1526  Document  Patient Outcome Other (Comment) (no new orders, pt stable )  Progress note created (see row info) Yes

## 2019-11-21 NOTE — ED Notes (Signed)
Admitting provider updated on Pt's blood pressure

## 2019-11-21 NOTE — Progress Notes (Signed)
Pharmacy Antibiotic Note  Travis [name] is a 64 y.o. male admitted on 11/20/2019 with sepsis.  Pharmacy has been consulted for Cefepime and Vancomycin dosing.      Temp (24hrs), Avg:100.6 F (38.1 C), Min:99.2 F (37.3 C), Max:103 F (39.4 C)  Recent Labs  Lab 11/20/19 1544 11/20/19 1547 11/20/19 1600 11/21/19 0554  WBC  --   --  5.6 4.5  CREATININE  --  2.30* 2.49* 1.72*  LATICACIDVEN 1.6  --   --   --     Estimated Creatinine Clearance: 48.6 mL/min (A) (by C-G formula based on SCr of 1.72 mg/dL (H)).    No Known Allergies  Antimicrobials this admission: 8/27 Cefepime >>  8/27 Vancomycin >>   Dose adjustments this admission: N/a  Microbiology results: Pending   Plan:  Patient's had an improvement in Scr from 2.49 to 1.72. Likely from receiving blood/resuscitation  - Continue Cefepime 2g IV q12h (likely can change to q8h tomorrow)  - Will increase Vancomycin to 750mg  IV q12h  - Goal trough ~ 15 - Monitor patients renal function and urine output  - De-escalate ABX when appropriate   Thank you for allowing pharmacy to be a part of this patient's care.  Duanne Limerick PharmD. BCPS 11/21/2019 10:53 AM

## 2019-11-21 NOTE — Progress Notes (Deleted)
   11/21/19 1526  Assess: MEWS Score  MEWS Temp 0  MEWS Systolic 1  MEWS Pulse 0  MEWS RR 1  MEWS LOC 0  MEWS Score 2  MEWS Score Color Yellow

## 2019-11-21 NOTE — Progress Notes (Signed)
PROGRESS NOTE  YAACOV [name] QMG:[id number] DOB: 1956/03/25 DOA: 11/20/2019 PCP: [name], NP  HPI/Recap of past 24 hours: HPI: Travis [name] is a 64 y.o. male with medical history significant for nonischemic cardiomyopathy, history of left central retinal occlusion, chronic systolic heart failure, COPD, TIA, type 2 diabetes, hypertension hyperlipidemia who presents with concerns of eye soreness, dizziness and lower back pain.  For the past 2 days he has noticed some soreness to the back of his eyes bilaterally.  He denies any vision changes.  The soreness is also associated with some mild coughing and sneezing as well as frontal throbbing headache with some mild photosensitivity.  Denies any neck stiffness but feels like his left neck is sore.  Also has noted dizziness worse with sitting up.  He saw his PCP and was started on Zyrtec.  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  No new trauma.  He otherwise denies any shortness of breath or chest pain.  No nausea vomiting or diarrhea. No abdominal pain. No dark stools or bright red blood. No easy bleeding or bruising.  He reports 11 pound weight loss in the past month but has been having decreased appetite.  He endorsed smoking half a pack of tobacco a day.  He normally is a heavy drinker of about half a pint per week but has not had a drink since 2 weeks ago.  He denies any IV or illicit drug use. He is sexually active with male partner and denies any STDs or sick contact with her.   He was hospitalized overnight on 11/08/2019 to 11/09/2019 and at that time he presented with dizziness, transient bilateral vision loss and scapular pain.  Ophthalmology was consulted and recommended outpatient follow-up.  He was admitted for TIA work-up and had negative CT and negative MRI.   He was also notably found to be anemic with hemoglobin down to 9 from a prior of 12.9 in May.  He had negative iron panel,  B12, folate, and FOBT at that time. Also was found to be hypotensive initially and had his Coreg decreased.  He is also since followed up with cardiology outpatient and had his spironolactone discontinued.  Patient states that he has been following this new medication regimen.   ED Course: He was found to be febrile up to 103, tachycardic and tachypneic and hypotensive down to systolic of 89V over 69I.  This is improved to systolic above 503 with 2 L of IV LR fluid resuscitation.  He was found to have worsening anemia down to 6.6 and is being transfused 1 unit of PRBC.  CBC shows no leukocytosis however he was pancytopenic in his last admission.  His WBC is at 5.6 from a prior of 2.5.  Other electrolytes largely normal.  Creatinine stable around his baseline of 2-2.4.  LFTs are normal.  CTA chest/abdomen and pelvis was obtained to evaluate for dissection which was negative.  Degenerative changes seen in the lumbar spine.  11/21/19: Seen and examined mainly posterior soreness in his eyes bilaterally.  Discussed with ophthalmology Dr. Posey Pronto, will see in consultation.  Assessment/Plan: Principal Problem:   Hypotension Active Problems:   Diabetes (HCC)   HLD (hyperlipidemia)   Chronic systolic heart failure (HCC)   Tobacco use   Alcohol use   Fever of unknown origin   Anemia   CKD (chronic kidney disease), stage III  Hypotension, possibly iatrogenic Entresto and  Coreg on hold Maintain MAP greater than 65 Continue to closely monitor vital signs  AKI on CKD 3 a Baseline creatinine appears to be 1.7 with GFR of 48 Presented with creatinine of 2.4 Creatinine is down trending Repeat BMP  Severe iron deficiency anemia, unclear etiology Iron studies suggestive of iron deficiency We will give 1 dose of IV Feraheme 510 mg once Received 1 unit PRBC transfusion on 11/20/2019 Hemoglobin 7.9 on 11/21/2019 Continue to monitor H&H Transfuse if hemoglobin less than 7  Fever of unknown  origin Unclear etiology.  Chest x-ray, UA, CTA negative.  Covid PCR negative. Patient did have pancytopenia during his last admission and now his WBC and platelets are improved but still at the low end of normal  likely reactive to this new infection.  Unclear if he has some hematological malignancy. Will also obtain EBV, respiratory viral panel, ESR/CRP Blood culture negative to date, continue to follow cultures Continue broad-spectrum antibiotics with IV vancomycin and cefepime  Chronic systolic heart failure  Last 2D echo done on 11/09/19 showed LVEF 45-50%  Euvolemic on exam Closely monitor volume status while on IV fluid Continue strict I's and O's and daily weight  Type 2 diabetes, well controlled Last hemoglobin A1c of 7.3 on 11/09/19 CBGs and serum glucose within normal limits  Tobacco use disorder  Encourage cessation  Alcohol use disorder Reports last use about 2 weeks ago.  Normally does have a pint per week. No signs of withdrawal.  DVT prophylaxis: SCD Code Status: Full Family Communication: Plan discussed with patient at bedside   Consults called: Ophthalmology, Dr. Sammie Bench.  Antimicrobials:  Cefepime  IV vancomycin   Status is: Inpatient    Dispo: The patient is from: Home.               Anticipated d/c is to: Home.              Anticipated d/c date is: 11/23/2019.               Patient currently not stable for discharge due to ongoing work-up.       Objective: Vitals:   11/21/19 1345 11/21/19 1400 11/21/19 1415 11/21/19 1526  BP: _0 93/64  Pulse:  88 91 89  Resp: 17 (!) 26 (!) 27 (!) 24  Temp:    98.2 F (36.8 C)  TempSrc:    Oral  SpO2:  93% 94% 100%  Weight:    82.8 kg  Height:    _1  (1.93 m)    Intake/Output Summary (Last 24 hours) at 11/21/2019 1547 Last data filed at 11/21/2019 0039 Gross per 24 hour  Intake 315 ml  Output 300 ml  Net 15 ml   Filed Weights   11/21/19 1526  Weight: 82.8 kg     Exam:  . General: 64 y.o. year-old male well developed well nourished in no acute distress.  Alert and oriented x3. . Cardiovascular: Regular rate and rhythm with no rubs or gallops.  No thyromegaly or JVD noted.   [name] Respiratory: Clear to auscultation with no wheezes or rales. Good inspiratory effort. . Abdomen: Soft nontender nondistended with normal bowel sounds x4 quadrants. . Musculoskeletal: No lower extremity edema. 2/4 pulses in all 4 extremities. [name] Psychiatry: Mood is appropriate for condition and setting   Data Reviewed: CBC: Recent Labs  Lab 11/20/19 1547 11/20/19 1600 11/21/19 0554  WBC  --  5.6 4.5  NEUTROABS  --  4.3  --  HGB 7.5* 6.6* 7.9*  HCT 22.0* 21.4* 24.6*  MCV  --  109.2* 102.5*  PLT  --  186 194   Basic Metabolic Panel: Recent Labs  Lab 11/20/19 1547 11/20/19 1600 11/21/19 0554  NA 134* 132* 135  K 4.8 4.8 4.6  CL 105 105 107  CO2  --  20* 18*  GLUCOSE 90 96 101*  BUN 34* 29* 23  CREATININE 2.30* 2.49* 1.72*  CALCIUM  --  8.1* 8.2*   GFR: Estimated Creatinine Clearance: 51.5 mL/min (A) (by C-G formula based on SCr of 1.72 mg/dL (H)). Liver Function Tests: Recent Labs  Lab 11/20/19 1600 11/21/19 0554  AST 22 20  ALT 18 16  ALKPHOS 27* 26*  BILITOT 0.7 0.9  PROT 5.0* 4.9*  ALBUMIN 2.4* 2.2*   No results for input(s): LIPASE, AMYLASE in the last 168 hours. No results for input(s): AMMONIA in the last 168 hours. Coagulation Profile: Recent Labs  Lab 11/21/19 0054  INR 1.3*   Cardiac Enzymes: No results for input(s): CKTOTAL, CKMB, CKMBINDEX, TROPONINI in the last 168 hours. BNP (last 3 results) No results for input(s): PROBNP in the last 8760 hours. HbA1C: No results for input(s): HGBA1C in the last 72 hours. CBG: Recent Labs  Lab 11/20/19 1533  GLUCAP 87   Lipid Profile: No results for input(s): CHOL, HDL, LDLCALC, TRIG, CHOLHDL, LDLDIRECT in the last 72 hours. Thyroid Function Tests: No results for input(s): TSH,  T4TOTAL, FREET4, T3FREE, THYROIDAB in the last 72 hours. Anemia Panel: Recent Labs    11/21/19 0054 11/21/19 0055  FERRITIN  --  652*  TIBC  --  239*  IRON  --  16*  RETICCTPCT 0.8  --    Urine analysis:    Component Value Date/Time   COLORURINE YELLOW 11/20/2019 1600   APPEARANCEUR HAZY (A) 11/20/2019 1600   APPEARANCEUR Cloudy 11/19/2012 1107   LABSPEC 1.036 (H) 11/20/2019 1600   LABSPEC 1.024 11/19/2012 1107   PHURINE 5.0 11/20/2019 1600   GLUCOSEU NEGATIVE 11/20/2019 1600   GLUCOSEU Negative 11/19/2012 1107   HGBUR SMALL (A) 11/20/2019 1600   BILIRUBINUR NEGATIVE 11/20/2019 1600   BILIRUBINUR Negative 11/19/2012 1107   KETONESUR NEGATIVE 11/20/2019 1600   PROTEINUR NEGATIVE 11/20/2019 1600   UROBILINOGEN 0.2 09/14/2013 1225   NITRITE NEGATIVE 11/20/2019 1600   LEUKOCYTESUR TRACE (A) 11/20/2019 1600   LEUKOCYTESUR Negative 11/19/2012 1107   Sepsis Labs: _0 (procalcitonin:4,lacticidven:4)  ) Recent Results (from the past 240 hour(s))  Urine culture     Status: Abnormal   Collection Time: 11/20/19  2:58 PM   Specimen: In/Out Cath Urine  Result Value Ref Range Status   Specimen Description IN/OUT CATH URINE  Final   Special Requests   Final    NONE Performed at [facility], [place] [address], [place], [place] [address]    Culture MULTIPLE SPECIES PRESENT, SUGGEST RECOLLECTION (A)  Final   Report Status 11/21/2019 FINAL  Final  Blood Culture (routine x 2)     Status: None (Preliminary result)   Collection Time: 11/20/19  3:47 PM   Specimen: BLOOD  Result Value Ref Range Status   Specimen Description BLOOD LEFT ANTECUBITAL  Final   Special Requests   Final    BOTTLES DRAWN AEROBIC AND ANAEROBIC Blood Culture adequate volume   Culture   Final    NO GROWTH < 24 HOURS Performed at [facility], [place] [address] [place],  [address]    Report Status PENDING  Incomplete  SARS Coronavirus 2 by RT PCR (hospital order, performed in [facility]  hospital lab) Nasopharyngeal Nasopharyngeal Swab     Status: None   Collection Time: 11/20/19  4:12 PM   Specimen: Nasopharyngeal Swab  Result Value Ref Range Status   SARS Coronavirus 2 NEGATIVE NEGATIVE Final    Comment: (NOTE) SARS-CoV-2 target nucleic acids are NOT DETECTED.  The SARS-CoV-2 RNA is generally detectable in upper and lower respiratory specimens during the acute phase of infection. The lowest concentration of SARS-CoV-2 viral copies this assay can detect is 250 copies / mL. A negative result does not preclude SARS-CoV-2 infection and should not be used as the sole basis for treatment or other patient management decisions.  A negative result may occur with improper specimen collection / handling, submission of specimen other than nasopharyngeal swab, presence of viral mutation(s) within the areas targeted by this assay, and inadequate number of viral copies (<250 copies / mL). A negative result must be combined with clinical observations, patient history, and epidemiological information.  Fact Sheet for Patients:   StrictlyIdeas.no  Fact Sheet for Healthcare Providers: BankingDealers.co.za  This test is not yet approved or  cleared by the [place] FDA and has been authorized for detection and/or diagnosis of SARS-CoV-2 by FDA under an Emergency Use Authorization (EUA).  This EUA will remain in effect (meaning this test can be used) for the duration of the COVID-19 declaration under Section 564(b)(1) of the Act, 21 U.S.C. section 360bbb-3(b)(1), unless the authorization is terminated or revoked sooner.  Performed at [facility], [place] [address], [place], [place] [address]   Blood Culture (routine x 2)     Status: None (Preliminary result)   Collection Time: 11/20/19 10:35 PM   Specimen: BLOOD LEFT HAND  Result Value Ref Range Status   Specimen Description BLOOD LEFT HAND  Final   Special Requests   Final     BOTTLES DRAWN AEROBIC AND ANAEROBIC Blood Culture adequate volume   Culture   Final    NO GROWTH < 12 HOURS Performed at [facility], [place] [address], [place], [place] [address]    Report Status PENDING  Incomplete  Respiratory Panel by PCR     Status: None   Collection Time: 11/21/19  5:56 AM   Specimen: Nasopharyngeal Swab; Respiratory  Result Value Ref Range Status   Adenovirus NOT DETECTED NOT DETECTED Final   Coronavirus 229E NOT DETECTED NOT DETECTED Final    Comment: (NOTE) The Coronavirus on the Respiratory Panel, DOES NOT test for the novel  Coronavirus (2019 nCoV)    Coronavirus HKU1 NOT DETECTED NOT DETECTED Final   Coronavirus NL63 NOT DETECTED NOT DETECTED Final   Coronavirus OC43 NOT DETECTED NOT DETECTED Final   Metapneumovirus NOT DETECTED NOT DETECTED Final   Rhinovirus / Enterovirus NOT DETECTED NOT DETECTED Final   Influenza A NOT DETECTED NOT DETECTED Final   Influenza B NOT DETECTED NOT DETECTED Final   Parainfluenza Virus 1 NOT DETECTED NOT DETECTED Final   Parainfluenza Virus 2 NOT DETECTED NOT DETECTED Final   Parainfluenza Virus 3 NOT DETECTED NOT DETECTED Final   Parainfluenza Virus 4 NOT DETECTED NOT DETECTED Final   Respiratory Syncytial Virus NOT DETECTED NOT DETECTED Final   Bordetella pertussis NOT DETECTED NOT DETECTED Final   Chlamydophila pneumoniae NOT DETECTED NOT DETECTED Final   Mycoplasma pneumoniae NOT DETECTED NOT DETECTED Final    Comment: Performed at [facility], [place] [address],  [place], [place] [address]      Studies: DG Chest Port 1 View  Result Date: 11/20/2019 CLINICAL DATA:  Questionable sepsis. EXAM: PORTABLE CHEST 1 VIEW COMPARISON:  11/08/2019 FINDINGS: Heart and mediastinal contours are within normal limits. No focal opacities or effusions. No acute bony abnormality. IMPRESSION: No active disease. Electronically Signed   By: Rolm Baptise M.D.   On: 11/20/2019 15:53    Scheduled Meds: . sodium chloride    Intravenous Once  . allopurinol  200 mg Oral Daily  . aspirin EC  162.5 mg Oral Daily  . atorvastatin  20 mg Oral Daily  . celecoxib  100 mg Oral BID  . dextrose  1 ampule Intravenous Once  . fenofibrate  160 mg Oral Daily  . folic acid  0.5 mg Oral Daily    Continuous Infusions: . ceFEPime (MAXIPIME) IV Stopped (11/21/19 0452)  . dextrose    . magnesium sulfate bolus IVPB    . potassium chloride Stopped (11/20/19 1620)  . vancomycin Stopped (11/21/19 1317)     LOS: 1 day     Kayleen Memos, MD [facility] Pager [phone]  If 7PM-7AM, please contact night-coverage [email or web address] Password [facility] 11/21/2019, 3:47 PM

## 2019-11-22 LAB — COMPREHENSIVE METABOLIC PANEL
ALT: 15 U/L (ref 0–44)
AST: 22 U/L (ref 15–41)
Albumin: 2.1 g/dL — ABNORMAL LOW (ref 3.5–5.0)
Alkaline Phosphatase: 27 U/L — ABNORMAL LOW (ref 38–126)
Anion gap: 7 (ref 5–15)
BUN: 18 mg/dL (ref 8–23)
CO2: 17 mmol/L — ABNORMAL LOW (ref 22–32)
Calcium: 8 mg/dL — ABNORMAL LOW (ref 8.9–10.3)
Chloride: 108 mmol/L (ref 98–111)
Creatinine, Ser: 1.38 mg/dL — ABNORMAL HIGH (ref 0.61–1.24)
GFR calc Af Amer: >60 mL/min (ref >60)
GFR calc non Af Amer: 54 mL/min — ABNORMAL LOW (ref >60)
Glucose, Bld: 96 mg/dL (ref 70–99)
Potassium: 4.3 mmol/L (ref 3.5–5.1)
Sodium: 132 mmol/L — ABNORMAL LOW (ref 135–145)
Total Bilirubin: 0.8 mg/dL (ref 0.3–1.2)
Total Protein: 4.6 g/dL — ABNORMAL LOW (ref 6.5–8.1)

## 2019-11-22 LAB — CBC
HCT: 23.5 % — ABNORMAL LOW (ref 39.0–52.0)
Hemoglobin: 7.9 g/dL — ABNORMAL LOW (ref 13.0–17.0)
MCH: 33.8 pg (ref 26.0–34.0)
MCHC: 33.6 g/dL (ref 30.0–36.0)
MCV: 100.4 fL — ABNORMAL HIGH (ref 80.0–100.0)
Platelets: 156 10*3/uL (ref 150–400)
RBC: 2.34 MIL/uL — ABNORMAL LOW (ref 4.22–5.81)
RDW: 17 % — ABNORMAL HIGH (ref 11.5–15.5)
WBC: 4.5 10*3/uL (ref 4.0–10.5)
nRBC: 0 % (ref 0.0–0.2)

## 2019-11-22 LAB — HAPTOGLOBIN: Haptoglobin: 297 mg/dL (ref 32–363)

## 2019-11-22 MED ORDER — TROPICAMIDE 1 % OP SOLN
2.0000 [drp] | Freq: Once | OPHTHALMIC | Status: AC
  Administered 2019-11-22: 2 [drp] via OPHTHALMIC
  Filled 2019-11-22: qty 15

## 2019-11-22 MED ORDER — POLYVINYL ALCOHOL 1.4 % OP SOLN
1.0000 [drp] | Freq: Three times a day (TID) | OPHTHALMIC | Status: DC
  Administered 2019-11-22 – 2019-11-26 (×11): 1 [drp] via OPHTHALMIC
  Filled 2019-11-22: qty 15

## 2019-11-22 MED ORDER — PROPARACAINE HCL 0.5 % OP SOLN
1.0000 [drp] | Freq: Once | OPHTHALMIC | Status: AC
  Administered 2019-11-22: 1 [drp] via OPHTHALMIC
  Filled 2019-11-22: qty 15

## 2019-11-22 MED ORDER — SODIUM CHLORIDE 0.9 % IV SOLN
2.0000 g | Freq: Three times a day (TID) | INTRAVENOUS | Status: DC
  Administered 2019-11-22 – 2019-11-23 (×3): 2 g via INTRAVENOUS
  Filled 2019-11-22 (×3): qty 2

## 2019-11-22 MED ORDER — ASPIRIN EC 81 MG PO TBEC
162.0000 mg | DELAYED_RELEASE_TABLET | Freq: Every day | ORAL | Status: DC
  Administered 2019-11-23 – 2019-11-26 (×4): 162 mg via ORAL
  Filled 2019-11-22 (×5): qty 2

## 2019-11-22 MED ORDER — OXYCODONE HCL 5 MG PO TABS
5.0000 mg | ORAL_TABLET | Freq: Four times a day (QID) | ORAL | Status: DC | PRN
  Administered 2019-11-22 – 2019-11-26 (×11): 5 mg via ORAL
  Filled 2019-11-22 (×11): qty 1

## 2019-11-22 MED ORDER — SENNOSIDES-DOCUSATE SODIUM 8.6-50 MG PO TABS
2.0000 | ORAL_TABLET | Freq: Two times a day (BID) | ORAL | Status: DC
  Administered 2019-11-22 – 2019-11-26 (×5): 2 via ORAL
  Filled 2019-11-22 (×6): qty 2

## 2019-11-22 MED ORDER — HYPROMELLOSE (GONIOSCOPIC) 2.5 % OP SOLN: 1.0000 [drp] | Freq: Three times a day (TID) | OPHTHALMIC | Status: DC

## 2019-11-22 NOTE — Progress Notes (Addendum)
Pharmacy Antibiotic Note  Travis [name] is a 64 y.o. male admitted on 11/20/2019 with sepsis.  Pharmacy has been consulted for Cefepime and Vancomycin dosing.  Height: 6\' 4"  (193 cm) Weight: 82.8 kg (182 lb 8 oz) IBW/kg (Calculated) : 86.8  Temp (24hrs), Avg:99 F (37.2 C), Min:98.2 F (36.8 C), Max:100.3 F (37.9 C)  Recent Labs  Lab 11/20/19 1544 11/20/19 1547 11/20/19 1600 11/21/19 0554 11/22/19 0013 11/22/19 0741  WBC  --   --  5.6 4.5  --  4.5  CREATININE  --  2.30* 2.49* 1.72* 1.38*  --   LATICACIDVEN 1.6  --   --   --   --   --     Estimated Creatinine Clearance: 64.2 mL/min (A) (by C-G formula based on SCr of 1.38 mg/dL (H)).    No Known Allergies  Antimicrobials this admission: 8/27 Cefepime >>  8/27 Vancomycin >> 8/29  Dose adjustments this admission: - Cefepime 2g q12h changed to cefepime 2g q8h due to an improvement in the patient's renal function. - Vancomycin discontinued, all cultures with no growth to date. Although no PCR ordered this admission, all previous were negative. Per Dr. [place] Crane, can discontinue vancomycin therapy at this time.  Microbiology results: 8/27 UCx: multiple species, suggst recollection 8/28 Resp panel: neg 8/27 BCx: NGTD <24 hours   Plan:  - Increase cefepime frequency to 2g IV q8h - Discontinue vancomycin therapy - Monitor patients renal function, cultures, length of therapy and urine output  - De-escalate ABX when appropriate   Thank you for allowing pharmacy to be a part of this patient's care.  Shauna Hugh, PharmD, [place]  PGY-1 Pharmacy Resident 11/22/2019 2:52 PM  Please check [email or web address] for unit-specific pharmacy phone numbers.

## 2019-11-22 NOTE — Progress Notes (Signed)
PROGRESS NOTE  Travis [name] TKW:[id number] DOB: [birth year] DOA: 11/20/2019 PCP: [name], NP  HPI/Recap of past 24 hours: HPI: Travis [name] is a 64 y.o. male with medical history significant for nonischemic cardiomyopathy, history of left central retinal occlusion, chronic systolic heart failure, COPD, TIA, type 2 diabetes, hypertension, hyperlipidemia who presents with concerns of bilateral eye soreness, blurry vision, dizziness and lower back pain.  For the past 2 days he has noticed some soreness to the back of his eyes bilaterally. The soreness is also associated with some mild coughing and sneezing as well as frontal throbbing headache with some mild photosensitivity.  Denies any neck stiffness but feels like his left neck is sore.  Also has noted dizziness worse with sitting up.  He saw his PCP and was started on Zyrtec.  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  No new trauma. He reports 11 pound weight loss in the past month but has been having decreased appetite.  He endorsed smoking half a pack of tobacco a day.  He normally is a heavy drinker of about half a pint per week but has not had a drink since 2 weeks prior to his presentation.  He denies any IV or illicit drug use. He is sexually active with male partner and denies any STDs or sick contact with her.   He was hospitalized overnight on 11/08/2019 to 11/09/2019 and at that time he presented with dizziness, transient bilateral vision loss and scapular pain.  Ophthalmology was consulted and recommended outpatient follow-up.  He was admitted for TIA work-up and had negative CT and negative MRI.   He was also notably found to be anemic with hemoglobin down to 9 from a prior of 12.9 in May.  He had negative FOBT at that time. Also was found to be hypotensive initially and had his Coreg dose decreased.  He is followed by cardiology outpatient and had his spironolactone  discontinued.  Patient states that he has been following this new medication regimen.   ED Course: Tmax 103, tachycardic and tachypneic and hypotensive down to systolic of 92E over 26S.  He was found to have worsening anemia down to 6.6 and transfused 1 unit of PRBC.  CTA chest/abdomen and pelvis were obtained to evaluate for dissection which was negative.  Degenerative changes seen in the lumbar spine.  11/22/19: Seen and examined mainly complaints of posterior soreness in his eyes bilaterally.  Discussed with ophthalmology Dr. Bradly Bienenstock, and requested a consult.  Assessment/Plan: Principal Problem:   Hypotension Active Problems:   Diabetes (HCC)   HLD (hyperlipidemia)   Chronic systolic heart failure (HCC)   Tobacco use   Alcohol use   Fever of unknown origin   Anemia   CKD (chronic kidney disease), stage III  Hypotension, possibly iatrogenic Entresto and Coreg on hold Maintain MAP greater than 65 Continue to closely monitor vital signs  Bilateral eye soreness with blurry vision, unclear etiology Ophthalmology contacted for consult, Dr. Bradly Bienenstock Continue supportive care  Fever of unknown origin Unclear etiology.  Presented with fever with Tmax 103  Persistent, Tmax 100.3 early this AM Chest x-ray, UA, CTA negative.  Covid PCR negative. Respiratory panel negative. CRP and sed rate Hold off IV vancomycin Continue cefepime for now  AKI on CKD 2 Baseline creatinine appears to be 1.3 with GFR >60 Presented with creatinine of 2.4 Creatinine is down trending 1.3 Continue to  avoid nephrotoxins 1.2 L urine output recorded in the last 24 hours Repeat BMP  Severe iron deficiency anemia, unclear etiology Iron studies suggestive of iron deficiency Received 1 dose of IV Feraheme 510 mg once 11/21/19 Received 1 unit PRBC transfusion on 11/20/2019 Hemoglobin 7.9 on 11/22/2019 Continue to monitor H&H Transfuse if hemoglobin less than 7  Chronic systolic heart  failure  Last 2D echo done on 11/09/19 showed LVEF 45-50%  Euvolemic on exam Closely monitor volume status while on IV fluid Continue strict I's and O's and daily weight  Type 2 diabetes, well controlled Last hemoglobin A1c of 7.3 on 11/09/19 CBGs and serum glucose within normal limits  Tobacco use disorder  Encourage cessation  Alcohol use disorder Reports last use about 2 weeks prior to presentation.  Normally does have a pint per week. No evidence of alcohol withdrawal at the time of this visit.  DVT prophylaxis: SCD Code Status: Full Family Communication: Plan discussed with patient at bedside   Consults called: Ophthalmology, Dr. Sammie Bench.  Antimicrobials:  Cefepime  IV vancomycin, stopped on 11/22/2019.   Status is: Inpatient    Dispo: The patient is from: Home.               Anticipated d/c is to: Home.              Anticipated d/c date is: 11/24/2019.               Patient currently not stable for discharge due to ongoing work-up.       Objective: Vitals:   11/22/19 0828 11/22/19 1100 11/22/19 1248 11/22/19 1557  BP: 95/62 99/68  99/70  Pulse: 94     Resp: 16  18 17   Temp:  99.4 F (37.4 C) 98.5 F (36.9 C)   TempSrc:   Oral   SpO2: 100%     Weight:      Height:        Intake/Output Summary (Last 24 hours) at 11/22/2019 1630 Last data filed at 11/22/2019 1500 Gross per 24 hour  Intake 2954.61 ml  Output 1525 ml  Net 1429.61 ml   Filed Weights   11/21/19 1526  Weight: 82.8 kg    Exam:  . General: 64 y.o. year-old male pleasant well-developed well-nourished no acute distress.  Alert oriented x3.   . Cardiovascular: Regular rate and rhythm no rubs or gallops.   [name] Respiratory: Clear to auscultation no wheezes or rales.   . Abdomen: Soft nontender normal bowel sounds present.   . Musculoskeletal: No lower extremity edema bilaterally. [name] Psychiatry: Mood is appropriate for condition and setting.   Data Reviewed: CBC: Recent Labs  Lab  11/20/19 1547 11/20/19 1600 11/21/19 0554 11/22/19 0741  WBC  --  5.6 4.5 4.5  NEUTROABS  --  4.3  --   --   HGB 7.5* 6.6* 7.9* 7.9*  HCT 22.0* 21.4* 24.6* 23.5*  MCV  --  109.2* 102.5* 100.4*  PLT  --  186 175 314   Basic Metabolic Panel: Recent Labs  Lab 11/20/19 1547 11/20/19 1600 11/21/19 0554 11/22/19 0013  NA 134* 132* 135 132*  K 4.8 4.8 4.6 4.3  CL 105 105 107 108  CO2  --  20* 18* 17*  GLUCOSE 90 96 101* 96  BUN 34* 29* 23 18  CREATININE 2.30* 2.49* 1.72* 1.38*  CALCIUM  --  8.1* 8.2* 8.0*   GFR: Estimated Creatinine Clearance: 64.2 mL/min (A) (by C-G formula based on SCr  of 1.38 mg/dL (H)). Liver Function Tests: Recent Labs  Lab 11/20/19 1600 11/21/19 0554 11/22/19 0013  AST 22 20 22   ALT 18 16 15   ALKPHOS 27* 26* 27*  BILITOT 0.7 0.9 0.8  PROT 5.0* 4.9* 4.6*  ALBUMIN 2.4* 2.2* 2.1*   No results for input(s): LIPASE, AMYLASE in the last 168 hours. No results for input(s): AMMONIA in the last 168 hours. Coagulation Profile: Recent Labs  Lab 11/21/19 0054  INR 1.3*   Cardiac Enzymes: No results for input(s): CKTOTAL, CKMB, CKMBINDEX, TROPONINI in the last 168 hours. BNP (last 3 results) No results for input(s): PROBNP in the last 8760 hours. HbA1C: No results for input(s): HGBA1C in the last 72 hours. CBG: Recent Labs  Lab 11/20/19 1533  GLUCAP 87   Lipid Profile: No results for input(s): CHOL, HDL, LDLCALC, TRIG, CHOLHDL, LDLDIRECT in the last 72 hours. Thyroid Function Tests: No results for input(s): TSH, T4TOTAL, FREET4, T3FREE, THYROIDAB in the last 72 hours. Anemia Panel: Recent Labs    11/21/19 0054 11/21/19 0055  FERRITIN  --  652*  TIBC  --  239*  IRON  --  16*  RETICCTPCT 0.8  --    Urine analysis:    Component Value Date/Time   COLORURINE YELLOW 11/20/2019 1600   APPEARANCEUR HAZY (A) 11/20/2019 1600   APPEARANCEUR Cloudy 11/19/2012 1107   LABSPEC 1.036 (H) 11/20/2019 1600   LABSPEC 1.024 11/19/2012 1107   PHURINE  5.0 11/20/2019 1600   GLUCOSEU NEGATIVE 11/20/2019 1600   GLUCOSEU Negative 11/19/2012 1107   HGBUR SMALL (A) 11/20/2019 1600   BILIRUBINUR NEGATIVE 11/20/2019 1600   BILIRUBINUR Negative 11/19/2012 1107   KETONESUR NEGATIVE 11/20/2019 1600   PROTEINUR NEGATIVE 11/20/2019 1600   UROBILINOGEN 0.2 09/14/2013 1225   NITRITE NEGATIVE 11/20/2019 1600   LEUKOCYTESUR TRACE (A) 11/20/2019 1600   LEUKOCYTESUR Negative 11/19/2012 1107   Sepsis Labs: @LABRCNTIP (procalcitonin:4,lacticidven:4)  ) Recent Results (from the past 240 hour(s))  Urine culture     Status: Abnormal   Collection Time: 11/20/19  2:58 PM   Specimen: In/Out Cath Urine  Result Value Ref Range Status   Specimen Description IN/OUT CATH URINE  Final   Special Requests   Final    NONE Performed at [facility], [place] [address], [place], [place] [address]    Culture MULTIPLE SPECIES PRESENT, SUGGEST RECOLLECTION (A)  Final   Report Status 11/21/2019 FINAL  Final  Blood Culture (routine x 2)     Status: None (Preliminary result)   Collection Time: 11/20/19  3:47 PM   Specimen: BLOOD  Result Value Ref Range Status   Specimen Description BLOOD LEFT ANTECUBITAL  Final   Special Requests   Final    BOTTLES DRAWN AEROBIC AND ANAEROBIC Blood Culture adequate volume   Culture   Final    NO GROWTH < 24 HOURS Performed at [facility], [place] [address], [name], [place] [address]    Report Status PENDING  Incomplete  SARS Coronavirus 2 by RT PCR (hospital order, performed in [facility] hospital lab) Nasopharyngeal Nasopharyngeal Swab     Status: None   Collection Time: 11/20/19  4:12 PM   Specimen: Nasopharyngeal Swab  Result Value Ref Range Status   SARS Coronavirus 2 NEGATIVE NEGATIVE Final    Comment: (NOTE) SARS-CoV-2 target nucleic acids are NOT DETECTED.  The SARS-CoV-2 RNA is generally detectable in upper and lower respiratory specimens during the acute phase of infection. The lowest concentration of  SARS-CoV-2  viral copies this assay can detect is 250 copies / mL. A negative result does not preclude SARS-CoV-2 infection and should not be used as the sole basis for treatment or other patient management decisions.  A negative result may occur with improper specimen collection / handling, submission of specimen other than nasopharyngeal swab, presence of viral mutation(s) within the areas targeted by this assay, and inadequate number of viral copies (<250 copies / mL). A negative result must be combined with clinical observations, patient history, and epidemiological information.  Fact Sheet for Patients:   StrictlyIdeas.no  Fact Sheet for Healthcare Providers: BankingDealers.co.za  This test is not yet approved or  cleared by the [place] FDA and has been authorized for detection and/or diagnosis of SARS-CoV-2 by FDA under an Emergency Use Authorization (EUA).  This EUA will remain in effect (meaning this test can be used) for the duration of the COVID-19 declaration under Section 564(b)(1) of the Act, 21 U.S.C. section 360bbb-3(b)(1), unless the authorization is terminated or revoked sooner.  Performed at [facility], [place] [address], [place] [address]   Blood Culture (routine x 2)     Status: None (Preliminary result)   Collection Time: 11/20/19 10:35 PM   Specimen: BLOOD LEFT HAND  Result Value Ref Range Status   Specimen Description BLOOD LEFT HAND  Final   Special Requests   Final    BOTTLES DRAWN AEROBIC AND ANAEROBIC Blood Culture adequate volume   Culture   Final    NO GROWTH < 12 HOURS Performed at [facility], [place] [address], [place], [place] [address]    Report Status PENDING  Incomplete  Respiratory Panel by PCR     Status: None   Collection Time: 11/21/19  5:56 AM   Specimen: Nasopharyngeal Swab; Respiratory  Result Value Ref Range Status   Adenovirus NOT DETECTED NOT DETECTED Final    Coronavirus 229E NOT DETECTED NOT DETECTED Final    Comment: (NOTE) The Coronavirus on the Respiratory Panel, DOES NOT test for the novel  Coronavirus (2019 nCoV)    Coronavirus HKU1 NOT DETECTED NOT DETECTED Final   Coronavirus NL63 NOT DETECTED NOT DETECTED Final   Coronavirus OC43 NOT DETECTED NOT DETECTED Final   Metapneumovirus NOT DETECTED NOT DETECTED Final   Rhinovirus / Enterovirus NOT DETECTED NOT DETECTED Final   Influenza A NOT DETECTED NOT DETECTED Final   Influenza B NOT DETECTED NOT DETECTED Final   Parainfluenza Virus 1 NOT DETECTED NOT DETECTED Final   Parainfluenza Virus 2 NOT DETECTED NOT DETECTED Final   Parainfluenza Virus 3 NOT DETECTED NOT DETECTED Final   Parainfluenza Virus 4 NOT DETECTED NOT DETECTED Final   Respiratory Syncytial Virus NOT DETECTED NOT DETECTED Final   Bordetella pertussis NOT DETECTED NOT DETECTED Final   Chlamydophila pneumoniae NOT DETECTED NOT DETECTED Final   Mycoplasma pneumoniae NOT DETECTED NOT DETECTED Final    Comment: Performed at [facility], [place]. [address] [place], [place] [address]      Studies: No results found.  Scheduled Meds: . sodium chloride   Intravenous Once  . allopurinol  200 mg Oral Daily  . [START ON 11/23/2019] aspirin EC  162 mg Oral Daily  . atorvastatin  20 mg Oral Daily  . celecoxib  100 mg Oral BID  . dextrose  1 ampule Intravenous Once  . fenofibrate  160 mg Oral Daily  . folic acid  0.5 mg Oral Daily    Continuous Infusions: .  sodium chloride 75 mL/hr at 11/22/19 0018  . ceFEPime (MAXIPIME) IV    . dextrose    . magnesium sulfate bolus IVPB    . potassium chloride Stopped (11/20/19 1620)     LOS: 2 days     Kayleen Memos, MD [facility] Pager [phone]  If 7PM-7AM, please contact night-coverage [email or web address] Password [facility] 11/22/2019, 4:30 PM

## 2019-11-22 NOTE — Telephone Encounter (Signed)
Still in the hospital as of today

## 2019-11-22 NOTE — Progress Notes (Signed)
Eye drops administered per orders and MD notified.

## 2019-11-23 ENCOUNTER — Ambulatory Visit: Payer: Medicare Other | Admitting: Internal Medicine

## 2019-11-23 LAB — COMPREHENSIVE METABOLIC PANEL
ALT: 17 U/L (ref 0–44)
AST: 23 U/L (ref 15–41)
Albumin: 2.1 g/dL — ABNORMAL LOW (ref 3.5–5.0)
Alkaline Phosphatase: 31 U/L — ABNORMAL LOW (ref 38–126)
Anion gap: 7 (ref 5–15)
BUN: 14 mg/dL (ref 8–23)
CO2: 20 mmol/L — ABNORMAL LOW (ref 22–32)
Calcium: 8.2 mg/dL — ABNORMAL LOW (ref 8.9–10.3)
Chloride: 107 mmol/L (ref 98–111)
Creatinine, Ser: 1.29 mg/dL — ABNORMAL HIGH (ref 0.61–1.24)
GFR calc Af Amer: >60 mL/min (ref >60)
GFR calc non Af Amer: 59 mL/min — ABNORMAL LOW (ref >60)
Glucose, Bld: 113 mg/dL — ABNORMAL HIGH (ref 70–99)
Potassium: 4.4 mmol/L (ref 3.5–5.1)
Sodium: 134 mmol/L — ABNORMAL LOW (ref 135–145)
Total Bilirubin: 0.6 mg/dL (ref 0.3–1.2)
Total Protein: 4.9 g/dL — ABNORMAL LOW (ref 6.5–8.1)

## 2019-11-23 LAB — CBC
HCT: 24.4 % — ABNORMAL LOW (ref 39.0–52.0)
Hemoglobin: 7.9 g/dL — ABNORMAL LOW (ref 13.0–17.0)
MCH: 32.5 pg (ref 26.0–34.0)
MCHC: 32.4 g/dL (ref 30.0–36.0)
MCV: 100.4 fL — ABNORMAL HIGH (ref 80.0–100.0)
Platelets: 155 10*3/uL (ref 150–400)
RBC: 2.43 MIL/uL — ABNORMAL LOW (ref 4.22–5.81)
RDW: 16.7 % — ABNORMAL HIGH (ref 11.5–15.5)
WBC: 4.3 10*3/uL (ref 4.0–10.5)
nRBC: 0 % (ref 0.0–0.2)

## 2019-11-23 LAB — PROCALCITONIN: Procalcitonin: 0.17 ng/mL

## 2019-11-23 MED ORDER — FERROUS SULFATE 325 (65 FE) MG PO TABS
325.0000 mg | ORAL_TABLET | Freq: Every day | ORAL | Status: DC
  Administered 2019-11-23 – 2019-11-26 (×4): 325 mg via ORAL
  Filled 2019-11-23 (×4): qty 1

## 2019-11-23 NOTE — Evaluation (Signed)
Physical Therapy Evaluation Patient Details Name: Travis [name] MRN: [id number] DOB: 1955-10-01 Today's Date: 11/23/2019   History of Present Illness  Patient is a 64 y/o male who presents with back pain, dizziness, eye pain. Admitted with hypotension, anemia and AKI. Workup pending. Recent admission 11/08/19 for similar symptoms. PMH includes HTN, HLD, DM2, cardiomyopathy, tobacco use, ETOH abuse, central retinal artery occlusion, heart failure.  Clinical Impression  Patient presents with chronic back pain, headache, decreased activity tolerance and dyspnea on exertion s/p above. Pt lives alone and reports being independent for ADLs and ambulation PTA. Today, pt tolerated transfers and ambulation with supervision-Mod I for safety holding onto IV pole for support. VSS on RA; no dizziness and good BP response to exercise. Will need to perform higher level balance challenges and stair training next session to ensure safe d/c home. Will follow acutely to maximize independence and mobility prior to return home.    Follow Up Recommendations No PT follow up    Equipment Recommendations  None recommended by PT    Recommendations for Other Services       Precautions / Restrictions Precautions Precautions: None Restrictions Weight Bearing Restrictions: No      Mobility  Bed Mobility Overal bed mobility: Modified Independent                Transfers Overall transfer level: Modified independent Equipment used: None             General transfer comment: Stood from EOB without difficulty. Transferred to chair post ambulation.  Ambulation/Gait Ambulation/Gait assistance: Supervision;Modified independent (Device/Increase time) Gait Distance (Feet): 150 Feet Assistive device: IV Pole Gait Pattern/deviations: Step-through pattern;Decreased stride length Gait velocity: decreased Gait velocity interpretation: <1.8 ft/sec, indicate of risk for recurrent falls General Gait Details:  Slow, mostly steady gait holding onto IV pole for support; 2/4 DOE noted. VSS on RA. BP stable. No dizziness.  Stairs            Wheelchair Mobility    Modified Rankin (Stroke Patients Only)       Balance Overall balance assessment: No apparent balance deficits (not formally assessed)                                           Pertinent Vitals/Pain Pain Assessment: Faces Faces Pain Scale: Hurts little more Pain Location: back, chronic and migraine Pain Descriptors / Indicators: Aching;Headache;Sore Pain Intervention(s): Monitored during session;Repositioned;Premedicated before session    Home Living Family/patient expects to be discharged to:: Private residence Living Arrangements: Alone Available Help at Discharge: Family;Available PRN/intermittently Type of Home: Apartment Home Access: Stairs to enter Entrance Stairs-Rails: Right Entrance Stairs-Number of Steps: 2 Home Layout: One level Home Equipment: Cane - single point      Prior Function Level of Independence: Independent               Hand Dominance   Dominant Hand: Right    Extremity/Trunk Assessment   Upper Extremity Assessment Upper Extremity Assessment: Defer to OT evaluation    Lower Extremity Assessment Lower Extremity Assessment: Overall WFL for tasks assessed    Cervical / Trunk Assessment Cervical / Trunk Assessment: Normal  Communication   Communication: No difficulties  Cognition Arousal/Alertness: Awake/alert Behavior During Therapy: WFL for tasks assessed/performed Overall Cognitive Status: Within Functional Limits for tasks assessed  General Comments: for basic mobility tasks.      General Comments General comments (skin integrity, edema, etc.): Pt reaching into cabinet to organize clothes and putting things on shelf, no evidence of imbalance. Supine BP 100/59, sitting BP 99/84, Sitting BP post activity  120/60.    Exercises     Assessment/Plan    PT Assessment Patient needs continued PT services  PT Problem List Cardiopulmonary status limiting activity;Decreased mobility;Pain;Decreased activity tolerance       PT Treatment Interventions Therapeutic activities;Therapeutic exercise;Stair training;Functional mobility training;Patient/family education    PT Goals (Current goals can be found in the Care Plan section)  Acute Rehab PT Goals Patient Stated Goal: get washed up, go home PT Goal Formulation: With patient Time For Goal Achievement: 12/07/19 Potential to Achieve Goals: Good    Frequency Min 3X/week   Barriers to discharge Decreased caregiver support alone    Co-evaluation               AM-PAC PT "6 Clicks" Mobility  Outcome Measure Help needed turning from your back to your side while in a flat bed without using bedrails?: None Help needed moving from lying on your back to sitting on the side of a flat bed without using bedrails?: None Help needed moving to and from a bed to a chair (including a wheelchair)?: None Help needed standing up from a chair using your arms (e.g., wheelchair or bedside chair)?: None Help needed to walk in hospital room?: None Help needed climbing 3-5 steps with a railing? : A Little 6 Click Score: 23    End of Session   Activity Tolerance: Patient tolerated treatment well Patient left: in chair;with call bell/phone within reach Nurse Communication: Mobility status PT Visit Diagnosis: Difficulty in walking, not elsewhere classified (R26.2);Other (comment) (SOB)    Time: 1100-1115 PT Time Calculation (min) (ACUTE ONLY): 15 min   Charges:   PT Evaluation $PT Eval Low Complexity: 1 Low          Marisa Severin, PT, DPT Acute Rehabilitation Services Pager [phone] Office [place] 11/23/2019, 12:20 PM

## 2019-11-23 NOTE — Progress Notes (Signed)
PROGRESS NOTE  Travis [name] VOH:[id number] DOB: 03-Dec-1955 DOA: 11/20/2019 PCP: [name], NP  HPI/Recap of past 24 hours: HPI: Travis [name] is a 64 y.o. male with medical history significant for nonischemic cardiomyopathy, history of left central retinal occlusion, chronic systolic heart failure, COPD, TIA, type 2 diabetes, hypertension, hyperlipidemia who presents with concerns of bilateral eye soreness, blurry vision, dizziness and lower back pain.  He was hospitalized overnight on 11/08/2019 to 11/09/2019 and at that time he presented with dizziness, transient bilateral vision loss and scapular pain.  Ophthalmology was consulted and recommended outpatient follow-up.  He was admitted for TIA work-up and had negative CT and negative MRI.   He was also notably found to be anemic with hemoglobin down to 9 from a prior of 12.9 in May.  He had negative FOBT at that time. Also was found to be hypotensive initially and had his Coreg dose decreased.  He is followed by cardiology outpatient and had his spironolactone discontinued.  Patient states that he has been following this new medication regimen.   ED Course: Tmax 103, tachycardic and tachypneic and hypotensive down to systolic of 69S over 85I.  He was found to have worsening anemia down to 6.6 and transfused 1 unit of PRBC.  CTA chest/abdomen and pelvis negative.  Degenerative changes seen in the lumbar spine.  Mainly complaints of posterior soreness in his eyes bilaterally.  Seen by ophthalmology Dr. Posey Pronto, started on eyedrop for dry eyes.  11/23/19:  States has some mild improvement from his eyedrops and still has some pressures behind his eyes.  Persistent low grade fevers noted.  No clear etiology.  ID consulted to assist.  Assessment/Plan: Principal Problem:   Hypotension Active Problems:   Diabetes (HCC)   HLD (hyperlipidemia)   Chronic systolic heart failure (HCC)   Tobacco use   Alcohol use   Fever of unknown  origin   Anemia   CKD (chronic kidney disease), stage III  Hypotension, possibly iatrogenic Entresto and Coreg on hold Bps have been soft Maintain MAP greater than 65 Continue to closely monitor vital signs  Bilateral eye soreness with blurry vision/Dry eyes per ophthalmology Seen by ophthalmology, Dr. Posey Pronto Liquifilm tears TID  Fever of unknown origin Unclear etiology.  Presented with fever with Tmax 103  Persistent low grade fevers, Tmax 100.3 early this AM Chest x-ray, UA, CTA negative.  Covid PCR negative. Respiratory panel negative. CRP and sed rate elevated De-escalate IV antibiotics Hold off IV vancomycin Continue cefepime for now  AKI on CKD 2 Baseline creatinine appears to be 1.3 with GFR >60 Presented with creatinine of 2.4 His creatinine is back to his baseline 1.2 with GFR greater than 60 Continue to avoid nephrotoxins 1.3 L urine output recorded in the last 24 hours  Severe iron deficiency anemia, unclear etiology Iron studies suggestive of iron deficiency, FOBT negative on 11/08/2019. No overt bleeding Received 1 dose of IV Feraheme 510 mg once 11/21/19 Started ferrous sulfate 325 mg daily Received 1 unit PRBC transfusion on 11/20/2019 Hemoglobin 7.9 on 11/23/2019 Transfuse if hemoglobin less than 7  Chronic systolic heart failure  Last 2D echo done on 11/09/19 showed LVEF 45-50%  Euvolemic on exam Continue strict I's and O's and daily weight  Type 2 diabetes, well controlled Last hemoglobin A1c of 7.3 on 11/09/19 CBGs and serum glucose within normal limits  Tobacco use disorder  Encourage cessation  Alcohol use disorder Reports last use about 2 weeks prior to presentation.  Normally does have a pint per week. No evidence of alcohol withdrawal at the time of this visit.  DVT prophylaxis: SCD Code Status: Full Family Communication: Plan discussed with patient at bedside   Consults called: Ophthalmology, Dr. Sammie Bench.,  Infectious disease Dr.  Linus Salmons.  Antimicrobials:  Cefepime  IV vancomycin, stopped on 11/22/2019.   Status is: Inpatient    Dispo: The patient is from: Home.               Anticipated d/c is to: Home.              Anticipated d/c date is: 11/24/2019.               Patient currently not stable for discharge due to ongoing work-up.       Objective: Vitals:   11/22/19 2355 11/23/19 0438 11/23/19 0735 11/23/19 1152  BP: (!) 88/58 102/66 105/68 96/66  Pulse: 93 95 94 95  Resp: 20 19 20 20   Temp: 99.1 F (37.3 C) 99.8 F (37.7 C) 100.3 F (37.9 C) 99.1 F (37.3 C)  TempSrc: Oral Oral Oral Oral  SpO2: 96% 100% 98% 100%  Weight:  84 kg    Height:        Intake/Output Summary (Last 24 hours) at 11/23/2019 1324 Last data filed at 11/23/2019 0900 Gross per 24 hour  Intake 1447.55 ml  Output 1300 ml  Net 147.55 ml   Filed Weights   11/21/19 1526 11/23/19 0438  Weight: 82.8 kg 84 kg    Exam:  . General: 64 y.o. year-old male pleasant in no acute distress.  Alert and oriented x3.   . Cardiovascular: Regular rate and rhythm no rubs or gallops.   [name] Respiratory: Clear to auscultation no wheezes or rales. . Abdomen: Soft nontender normal bowel sounds present. . Musculoskeletal: No lower extremity edema bilaterally.   [name] Psychiatry: Mood is appropriate for condition and setting.   Data Reviewed: CBC: Recent Labs  Lab 11/20/19 1547 11/20/19 1600 11/21/19 0554 11/22/19 0741 11/23/19 0249  WBC  --  5.6 4.5 4.5 4.3  NEUTROABS  --  4.3  --   --   --   HGB 7.5* 6.6* 7.9* 7.9* 7.9*  HCT 22.0* 21.4* 24.6* 23.5* 24.4*  MCV  --  109.2* 102.5* 100.4* 100.4*  PLT  --  186 175 156 616   Basic Metabolic Panel: Recent Labs  Lab 11/20/19 1547 11/20/19 1600 11/21/19 0554 11/22/19 0013 11/23/19 0249  NA 134* 132* 135 132* 134*  K 4.8 4.8 4.6 4.3 4.4  CL 105 105 107 108 107  CO2  --  20* 18* 17* 20*  GLUCOSE 90 96 101* 96 113*  BUN 34* 29* 23 18 14   CREATININE 2.30* 2.49* 1.72* 1.38* 1.29*   CALCIUM  --  8.1* 8.2* 8.0* 8.2*   GFR: Estimated Creatinine Clearance: 69.6 mL/min (A) (by C-G formula based on SCr of 1.29 mg/dL (H)). Liver Function Tests: Recent Labs  Lab 11/20/19 1600 11/21/19 0554 11/22/19 0013 11/23/19 0249  AST 22 20 22 23   ALT 18 16 15 17   ALKPHOS 27* 26* 27* 31*  BILITOT 0.7 0.9 0.8 0.6  PROT 5.0* 4.9* 4.6* 4.9*  ALBUMIN 2.4* 2.2* 2.1* 2.1*   No results for input(s): LIPASE, AMYLASE in the last 168 hours. No results for input(s): AMMONIA in the last 168 hours. Coagulation Profile: Recent Labs  Lab 11/21/19 0054  INR 1.3*   Cardiac Enzymes: No results for input(s): CKTOTAL, CKMB,  CKMBINDEX, TROPONINI in the last 168 hours. BNP (last 3 results) No results for input(s): PROBNP in the last 8760 hours. HbA1C: No results for input(s): HGBA1C in the last 72 hours. CBG: Recent Labs  Lab 11/20/19 1533  GLUCAP 87   Lipid Profile: No results for input(s): CHOL, HDL, LDLCALC, TRIG, CHOLHDL, LDLDIRECT in the last 72 hours. Thyroid Function Tests: No results for input(s): TSH, T4TOTAL, FREET4, T3FREE, THYROIDAB in the last 72 hours. Anemia Panel: Recent Labs    11/21/19 0054 11/21/19 0055  FERRITIN  --  652*  TIBC  --  239*  IRON  --  16*  RETICCTPCT 0.8  --    Urine analysis:    Component Value Date/Time   COLORURINE YELLOW 11/20/2019 1600   APPEARANCEUR HAZY (A) 11/20/2019 1600   APPEARANCEUR Cloudy 11/19/2012 1107   LABSPEC 1.036 (H) 11/20/2019 1600   LABSPEC 1.024 11/19/2012 1107   PHURINE 5.0 11/20/2019 1600   GLUCOSEU NEGATIVE 11/20/2019 1600   GLUCOSEU Negative 11/19/2012 1107   HGBUR SMALL (A) 11/20/2019 1600   BILIRUBINUR NEGATIVE 11/20/2019 1600   BILIRUBINUR Negative 11/19/2012 1107   KETONESUR NEGATIVE 11/20/2019 1600   PROTEINUR NEGATIVE 11/20/2019 1600   UROBILINOGEN 0.2 09/14/2013 1225   NITRITE NEGATIVE 11/20/2019 1600   LEUKOCYTESUR TRACE (A) 11/20/2019 1600   LEUKOCYTESUR Negative 11/19/2012 1107   Sepsis  Labs: @LABRCNTIP (procalcitonin:4,lacticidven:4)  ) Recent Results (from the past 240 hour(s))  Urine culture     Status: Abnormal   Collection Time: 11/20/19  2:58 PM   Specimen: In/Out Cath Urine  Result Value Ref Range Status   Specimen Description IN/OUT CATH URINE  Final   Special Requests   Final    NONE Performed at [facility], [place] [address] [place], [place] [address]    Culture MULTIPLE SPECIES PRESENT, SUGGEST RECOLLECTION (A)  Final   Report Status 11/21/2019 FINAL  Final  Blood Culture (routine x 2)     Status: None (Preliminary result)   Collection Time: 11/20/19  3:47 PM   Specimen: BLOOD  Result Value Ref Range Status   Specimen Description BLOOD LEFT ANTECUBITAL  Final   Special Requests   Final    BOTTLES DRAWN AEROBIC AND ANAEROBIC Blood Culture adequate volume   Culture   Final    NO GROWTH 3 DAYS Performed at [facility], [place] [address] [place], [place] [address]    Report Status PENDING  Incomplete  SARS Coronavirus 2 by RT PCR (hospital order, performed in [facility] hospital lab) Nasopharyngeal Nasopharyngeal Swab     Status: None   Collection Time: 11/20/19  4:12 PM   Specimen: Nasopharyngeal Swab  Result Value Ref Range Status   SARS Coronavirus 2 NEGATIVE NEGATIVE Final    Comment: (NOTE) SARS-CoV-2 target nucleic acids are NOT DETECTED.  The SARS-CoV-2 RNA is generally detectable in upper and lower respiratory specimens during the acute phase of infection. The lowest concentration of SARS-CoV-2 viral copies this assay can detect is 250 copies / mL. A negative result does not preclude SARS-CoV-2 infection and should not be used as the sole basis for treatment or other patient management decisions.  A negative result may occur with improper specimen collection / handling, submission of specimen other than nasopharyngeal swab, presence of viral mutation(s) within the areas targeted by this assay, and inadequate number of viral  copies (<250 copies / mL). A negative result must be combined with clinical observations, patient history, and epidemiological information.  Fact Sheet  for Patients:   StrictlyIdeas.no  Fact Sheet for Healthcare Providers: BankingDealers.co.za  This test is not yet approved or  cleared by the [place] FDA and has been authorized for detection and/or diagnosis of SARS-CoV-2 by FDA under an Emergency Use Authorization (EUA).  This EUA will remain in effect (meaning this test can be used) for the duration of the COVID-19 declaration under Section 564(b)(1) of the Act, 21 U.S.C. section 360bbb-3(b)(1), unless the authorization is terminated or revoked sooner.  Performed at [facility], [place] [address], [place], [place] [address]   Blood Culture (routine x 2)     Status: None (Preliminary result)   Collection Time: 11/20/19 10:35 PM   Specimen: BLOOD LEFT HAND  Result Value Ref Range Status   Specimen Description BLOOD LEFT HAND  Final   Special Requests   Final    BOTTLES DRAWN AEROBIC AND ANAEROBIC Blood Culture adequate volume   Culture   Final    NO GROWTH 3 DAYS Performed at [facility], [place] [address] [place], [place] [address]    Report Status PENDING  Incomplete  Respiratory Panel by PCR     Status: None   Collection Time: 11/21/19  5:56 AM   Specimen: Nasopharyngeal Swab; Respiratory  Result Value Ref Range Status   Adenovirus NOT DETECTED NOT DETECTED Final   Coronavirus 229E NOT DETECTED NOT DETECTED Final    Comment: (NOTE) The Coronavirus on the Respiratory Panel, DOES NOT test for the novel  Coronavirus (2019 nCoV)    Coronavirus HKU1 NOT DETECTED NOT DETECTED Final   Coronavirus NL63 NOT DETECTED NOT DETECTED Final   Coronavirus OC43 NOT DETECTED NOT DETECTED Final   Metapneumovirus NOT DETECTED NOT DETECTED Final   Rhinovirus / Enterovirus NOT DETECTED NOT DETECTED Final   Influenza A NOT  DETECTED NOT DETECTED Final   Influenza B NOT DETECTED NOT DETECTED Final   Parainfluenza Virus 1 NOT DETECTED NOT DETECTED Final   Parainfluenza Virus 2 NOT DETECTED NOT DETECTED Final   Parainfluenza Virus 3 NOT DETECTED NOT DETECTED Final   Parainfluenza Virus 4 NOT DETECTED NOT DETECTED Final   Respiratory Syncytial Virus NOT DETECTED NOT DETECTED Final   Bordetella pertussis NOT DETECTED NOT DETECTED Final   Chlamydophila pneumoniae NOT DETECTED NOT DETECTED Final   Mycoplasma pneumoniae NOT DETECTED NOT DETECTED Final    Comment: Performed at [facility], [place]. [address], [place],  [address]      Studies: No results found.  Scheduled Meds: . sodium chloride   Intravenous Once  . allopurinol  200 mg Oral Daily  . aspirin EC  162 mg Oral Daily  . atorvastatin  20 mg Oral Daily  . celecoxib  100 mg Oral BID  . dextrose  1 ampule Intravenous Once  . fenofibrate  160 mg Oral Daily  . ferrous sulfate  325 mg Oral Q breakfast  . folic acid  0.5 mg Oral Daily  . polyvinyl alcohol  1 drop Both Eyes TID  . senna-docusate  2 tablet Oral BID    Continuous Infusions: . sodium chloride 50 mL/hr at 11/22/19 1737  . dextrose    . magnesium sulfate bolus IVPB    . potassium chloride Stopped (11/20/19 1620)     LOS: 3 days     Kayleen Memos, MD [facility] Pager [phone]  If 7PM-7AM, please contact night-coverage [email or web address] Password [email or web address] 11/23/2019, 1:24 PM

## 2019-11-23 NOTE — Consult Note (Signed)
[facility]ious [facility]       Reason for Consult: fever    Referring Physician: Dr. Nevada Crane  Principal Problem:   Hypotension Active Problems:   Diabetes ([place])   HLD (hyperlipidemia)   Chronic systolic heart failure (HCC)   Tobacco use   Alcohol use   Fever of unknown origin   Anemia   CKD (chronic kidney disease), stage III   . sodium chloride   Intravenous Once  . allopurinol  200 mg Oral Daily  . aspirin EC  162 mg Oral Daily  . atorvastatin  20 mg Oral Daily  . celecoxib  100 mg Oral BID  . dextrose  1 ampule Intravenous Once  . fenofibrate  160 mg Oral Daily  . ferrous sulfate  325 mg Oral Q breakfast  . folic acid  0.5 mg Oral Daily  . polyvinyl alcohol  1 drop Both Eyes TID  . senna-docusate  2 tablet Oral BID    Recommendations: Stop cefepime  continue with supportive care  Assessment: He has had about 3 weeks of fever, headache with associated diarrhea most c/w a viral illness with fever curve trending down.  No associated symptoms to consider bacterial infection including no sob, cough, sob, rash, dysuria, so will stop antibiotics.   He has anemia that appears new and most c/w iron deficiency.  He did receive a transfusion. Some intermittent leukopenia but is not new.  Likely alcohol related.    Antibiotics: Vancomycin and cefepime  HPI: Travis [name] is a 64 y.o. male with non ischemic cardiomyopathy due to alcohol with EF of 45-50% who came in with headache, visual changes and weakness.  His vision is back to normal.  He did note some diarrhea.  Headache resolved as well.  Had fever for 2-3 weeks with weakness.  No rash, no bloating, no dysuria, no myalgias or arthralgias.   No recent tick bites noted.    Review of Systems:  Constitutional: positive for anorexia and weight loss Gastrointestinal: positive for nausea and vomiting Integument/breast: negative for rash All other systems reviewed and are negative    Past Medical History:    Diagnosis Date  . Alcohol abuse   . 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 05/2019 Echo: EF 35-40%.  . Allergy   . Asthma   . 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 Chronic anemia   . Clotting disorder ([place])   . Diabetes mellitus, type 2 ([place])    pt reports his DM is gone  . GI bleed    15 years ago  . Gout   . Hemoptysis    secondary to pulmonary edema  . HFrEF (heart failure with reduced ejection fraction) ([place])    a. 12/2010 Echo: EF 20-25%; b 08/2013 Echo: EF 45-50%; c. 01/2015 Echo: EF 25-30%; d. 07/2016 Echo: EF 35-40%; e. 05/2019 Echo: EF 35-40%, glob HK. Mild LVH. Gr1 DD. Nl RV fxn.  . Hyperlipidemia   . Hypertension   . Nonischemic cardiomyopathy ([place])   . Nonobstructive Coronary artery disease    a. 01/2015 Cath: LM nl, LAD 30p/m, LCX nl, RCA 10p/m, RPDA min irregs.  . Osteoarthritis   . Pneumonia   . TIA (transient ischemic attack)   . Tobacco abuse   . Vision loss    peripherial vision only left eye.[place]  artery occusion     Social History   Tobacco Use  . Smoking status: Current Every Day Smoker  Packs/day: 0.50    Years: 30.00    Pack years: 15.00    Types: Cigarettes  . Smokeless tobacco: Never Used  Substance Use Topics  . Alcohol use: Yes    Alcohol/week: 1.0 standard drink    Types: 1 Glasses of wine per week    Comment: occasionally  . Drug use: Yes    Types: Marijuana    Family History  Problem Relation Age of Onset  . Diabetes Mother   . Hypertension Mother   . Diabetes Father   . Hypertension Father   . Diabetes Sister   . Dementia Brother   . Cancer Neg Hx   . Heart disease Neg Hx   . Stroke Neg Hx   . Colon cancer Neg Hx   . Esophageal cancer Neg Hx   . Rectal cancer Neg Hx   . Stomach cancer Neg Hx     No Known Allergies  Physical Exam: Constitutional: in no apparent distress  Vitals:   11/23/19 0735 11/23/19 1152  BP: 105/68 96/66  Pulse: 94 95  Resp: 20 20  Temp: 100.3 F (37.9 C)  99.1 F (37.3 C)  SpO2: 98% 100%   EYES: anicteric Cardiovascular: Cor RRR Respiratory: clear; GI: soft, nt, nd, normoactive bowel sounds Musculoskeletal: no pedal edema noted Skin: negatives: no rash Hematologic: no cervical lad  Lab Results  Component Value Date   WBC 4.3 11/23/2019   HGB 7.9 (L) 11/23/2019   HCT 24.4 (L) 11/23/2019   MCV 100.4 (H) 11/23/2019   PLT 155 11/23/2019    Lab Results  Component Value Date   CREATININE 1.29 (H) 11/23/2019   BUN 14 11/23/2019   NA 134 (L) 11/23/2019   K 4.4 11/23/2019   CL 107 11/23/2019   CO2 20 (L) 11/23/2019    Lab Results  Component Value Date   ALT 17 11/23/2019   AST 23 11/23/2019   ALKPHOS 31 (L) 11/23/2019     Microbiology: Recent Results (from the past 240 hour(s))  Urine culture     Status: Abnormal   Collection Time: 11/20/19  2:58 PM   Specimen: In/Out Cath Urine  Result Value Ref Range Status   Specimen Description IN/OUT CATH URINE  Final   Special Requests   Final    NONE Performed at [facility], [address], [place], [place] [address]    Culture MULTIPLE SPECIES PRESENT, SUGGEST RECOLLECTION (A)  Final   Report Status 11/21/2019 FINAL  Final  Blood Culture (routine x 2)     Status: None (Preliminary result)   Collection Time: 11/20/19  3:47 PM   Specimen: BLOOD  Result Value Ref Range Status   Specimen Description BLOOD LEFT ANTECUBITAL  Final   Special Requests   Final    BOTTLES DRAWN AEROBIC AND ANAEROBIC Blood Culture adequate volume   Culture   Final    NO GROWTH 3 DAYS Performed at [facility], [place] [address] [place], [place] [address]    Report Status PENDING  Incomplete  SARS Coronavirus 2 by RT PCR (hospital order, performed in [facility] hospital lab) Nasopharyngeal Nasopharyngeal Swab     Status: None   Collection Time: 11/20/19  4:12 PM   Specimen: Nasopharyngeal Swab  Result Value Ref Range Status   SARS Coronavirus 2 NEGATIVE NEGATIVE Final    Comment:  (NOTE) SARS-CoV-2 target nucleic acids are NOT DETECTED.  The SARS-CoV-2 RNA is generally detectable in upper and lower respiratory specimens during the  acute phase of infection. The lowest concentration of SARS-CoV-2 viral copies this assay can detect is 250 copies / mL. A negative result does not preclude SARS-CoV-2 infection and should not be used as the sole basis for treatment or other patient management decisions.  A negative result may occur with improper specimen collection / handling, submission of specimen other than nasopharyngeal swab, presence of viral mutation(s) within the areas targeted by this assay, and inadequate number of viral copies (<250 copies / mL). A negative result must be combined with clinical observations, patient history, and epidemiological information.  Fact Sheet for Patients:   StrictlyIdeas.no  Fact Sheet for Healthcare Providers: BankingDealers.co.za  This test is not yet approved or  cleared by the [place] FDA and has been authorized for detection and/or diagnosis of SARS-CoV-2 by FDA under an Emergency Use Authorization (EUA).  This EUA will remain in effect (meaning this test can be used) for the duration of the COVID-19 declaration under Section 564(b)(1) of the Act, 21 U.S.C. section 360bbb-3(b)(1), unless the authorization is terminated or revoked sooner.  Performed at [facility], [place] [address], [place], [place] [address]   Blood Culture (routine x 2)     Status: None (Preliminary result)   Collection Time: 11/20/19 10:35 PM   Specimen: BLOOD LEFT HAND  Result Value Ref Range Status   Specimen Description BLOOD LEFT HAND  Final   Special Requests   Final    BOTTLES DRAWN AEROBIC AND ANAEROBIC Blood Culture adequate volume   Culture   Final    NO GROWTH 3 DAYS Performed at [facility], [place] [address], [place], [place] [address]    Report Status PENDING  Incomplete    Respiratory Panel by PCR     Status: None   Collection Time: 11/21/19  5:56 AM   Specimen: Nasopharyngeal Swab; Respiratory  Result Value Ref Range Status   Adenovirus NOT DETECTED NOT DETECTED Final   Coronavirus 229E NOT DETECTED NOT DETECTED Final    Comment: (NOTE) The Coronavirus on the Respiratory Panel, DOES NOT test for the novel  Coronavirus (2019 nCoV)    Coronavirus HKU1 NOT DETECTED NOT DETECTED Final   Coronavirus NL63 NOT DETECTED NOT DETECTED Final   Coronavirus OC43 NOT DETECTED NOT DETECTED Final   Metapneumovirus NOT DETECTED NOT DETECTED Final   Rhinovirus / Enterovirus NOT DETECTED NOT DETECTED Final   Influenza A NOT DETECTED NOT DETECTED Final   Influenza B NOT DETECTED NOT DETECTED Final   Parainfluenza Virus 1 NOT DETECTED NOT DETECTED Final   Parainfluenza Virus 2 NOT DETECTED NOT DETECTED Final   Parainfluenza Virus 3 NOT DETECTED NOT DETECTED Final   Parainfluenza Virus 4 NOT DETECTED NOT DETECTED Final   Respiratory Syncytial Virus NOT DETECTED NOT DETECTED Final   Bordetella pertussis NOT DETECTED NOT DETECTED Final   Chlamydophila pneumoniae NOT DETECTED NOT DETECTED Final   Mycoplasma pneumoniae NOT DETECTED NOT DETECTED Final    Comment: Performed at [facility], [place]. [address], [place], [place] [address]    Vanissa Strength W Mattie Novosel, [facility] Infectious Disease [facility] [email or web address] 11/23/2019, 12:36 PM

## 2019-11-23 NOTE — Consult Note (Addendum)
Travis [name]                                                                               11/23/2019                                               Ophthalmology Consultation                                         Consult requested by: Dr. Nevada Crane  Reason for consultation:  Travis [name] vision both eyes HPI:  History of blurry vision in both eye with eye pain OD>OS.  Pain decrased after topical proparicaine.  Transient vision loss not associated with cebrovascular occlusion.  HIstory of HTN, heart failure, hyperlipidemia, eye soreness, dizziness, and lower back pain.  Pertinent Medical History:   Active Ambulatory Problems    Diagnosis Date Noted  . Diabetes ([place]) 11/30/2008  . HLD (hyperlipidemia) 11/30/2008  . Gout 11/30/2008  . Chronic systolic heart failure ([place]) 01/05/2011  . Tobacco use 01/05/2011  . Cardiomyopathy, nonischemic ([place]) 03/15/2013  . 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 Literacy level of illiterate 06/04/2014  . Essential hypertension 04/18/2015  . Osteoarthritis 05/09/2016  . COPD (chronic obstructive pulmonary disease) ([place]) 05/09/2016  . TIA (transient ischemic attack) 11/08/2019   Resolved Ambulatory Problems    Diagnosis Date Noted  . Iron deficiency anemia, unspecified 11/30/2008  . RENAL INSUFFICIENCY 11/30/2008  . CHEST PAIN 11/30/2008  . HYPERGLYCEMIA 11/30/2008  . Bronchitis, acute 03/12/2011  . Hip fracture requiring operative repair ([place]) 03/14/2013  . Hip fracture ([place]) 03/14/2013  . Smoker 03/14/2013  . Alcoholism in remission ([place]) 03/14/2013  . Closed right hip fracture ([place]) 03/14/2013  . 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 Acute blood loss anemia 03/17/2013  . Abnormal EKG 03/17/2013  . Acute renal insufficiency 09/13/2013  . Visual loss, left eye 09/13/2013  . Contact dermatitis 09/28/2013  . Noncompliance 10/22/2013  . Sinusitis 10/27/2014  . CHF (congestive heart failure), NYHA class I ([place]) 01/26/2015  . Chest  pain with moderate risk for cardiac etiology   . Hypotension 02/16/2015  . 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 Tachycardia 05/23/2015  . Right shoulder pain 06/27/2015  . Rotator cuff syndrome of right shoulder 08/24/2015  . S/P shoulder replacement 09/01/2015  . Lower back pain 06/26/2019   Past Medical History:  Diagnosis Date  . Alcohol abuse   . Allergy   . Asthma   . 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 Chronic anemia   . Clotting disorder ([place])   . Diabetes mellitus, type 2 ([place])   . GI bleed   . Hemoptysis   . HFrEF (heart failure with reduced ejection fraction) ([place])   . Hyperlipidemia   . Hypertension   . Nonischemic cardiomyopathy ([place])   . Nonobstructive Coronary artery disease   . Pneumonia   . Tobacco abuse   . Vision loss  Pertinent Ophthalmic History: Central retinal vascular occlusion left eye  Current Eye Medications: none Systemic medications on admission:   Medications Prior to Admission  Medication Sig Dispense Refill  . albuterol (PROVENTIL HFA;VENTOLIN HFA) 108 (90 BASE) MCG/ACT inhaler Inhale 2 puffs into the lungs every 6 (six) hours as needed for wheezing or shortness of breath. Take 2 puffs every 5-10 minutes up to 6 puffs total over 15 minutes when needed.  Use with a spacer. 1 Inhaler 2  . allopurinol (ZYLOPRIM) 100 MG tablet Take 2 tablets (200 mg total) by mouth daily. 180 tablet 2  . aspirin EC 325 MG tablet Take 162.5 mg by mouth daily.    [name] atorvastatin (LIPITOR) 20 MG tablet Take 1 tablet (20 mg total) by mouth daily. 90 tablet 3  . carvedilol (COREG) 3.125 MG tablet Take 1 tablet (3.125 mg total) by mouth 2 (two) times daily with a meal. 60 tablet 1  . celecoxib (CELEBREX) 100 MG capsule Take 1 capsule (100 mg total) by mouth 2 (two) times daily. 60 capsule 2  . cetirizine (ZYRTEC) 10 MG tablet TAKE 1 TABLET BY MOUTH DAILY (Patient taking differently: Take 10 mg by mouth daily. ) 30 tablet 11  .  cyclobenzaprine (FLEXERIL) 10 MG tablet Take 1 tablet (10 mg total) by mouth 3 (three) times daily as needed for muscle spasms. 20 tablet 0  . ENTRESTO 97-103 MG TAKE 1 TABLET BY MOUTH TWICE (2) DAILY (Patient taking differently: Take 1 tablet by mouth 2 (two) times daily. ) 180 tablet 3  . fenofibrate (TRICOR) 145 MG tablet TAKE 1 TABLET BY MOUTH ONCE A DAY (Patient taking differently: Take 145 mg by mouth daily. ) 90 tablet 0  . fluticasone (FLONASE) 50 MCG/ACT nasal spray Place 1 spray into both nostrils 2 (two) times daily. (Patient taking differently: Place 1 spray into both nostrils daily as needed for allergies or rhinitis. ) 16 g 0  . folic acid (FOLVITE) 1 MG tablet Take 1 tablet (1 mg total) by mouth daily. (Patient taking differently: Take 0.5 mg by mouth daily. ) 30 tablet 3  . lidocaine (LIDODERM) 5 % Place 1 patch onto the skin daily. Remove & Discard patch within 12 hours or as directed by MD (Patient taking differently: Place 1 patch onto the skin daily as needed (pain). Remove & Discard patch within 12 hours or as directed by MD) 30 patch 0  . Multiple Vitamin (MULTIVITAMIN WITH MINERALS) TABS tablet Take 1 tablet by mouth daily. Men's One a Day    . Omega-3 Fatty Acids (FISH OIL PO) Take 1 capsule by mouth daily.    [name] triamcinolone cream (KENALOG) 0.1 % APPLY TO AFFECTED AREAS TWICE A DAY AS NEEDED. AVOID FACE,GROIN, OR UNDERARMS (Patient taking differently: Apply 1 application topically 2 (two) times daily as needed (eczema). ) 454 g 0  . baclofen (LIORESAL) 10 MG tablet Take 1 tablet (10 mg total) by mouth 3 (three) times daily as needed for muscle spasms (sedation caution). (Patient not taking: Reported on 11/20/2019) 30 each 0       ROS:  Review of Systems  Constitutional: Positive for fever.  HENT: Negative.   Eyes: Positive for blurred vision.  Respiratory: Negative.   Cardiovascular: Negative.   Gastrointestinal: Negative.   Genitourinary: Negative.   Musculoskeletal:  Negative.   Skin: Negative.   Neurological: Negative.   Endo/Heme/Allergies: Negative.   Psychiatric/Behavioral: Negative.      Visual Fields: FTC OD   Pupils:  Pharmacologically  dilated at my direction before exam     Near acuity:   20/40 OD, 20/800 OS  TA:       OD: 14 mmhg    OS: 15 mmhg  by [place]  Dilation:  both eyes        Medication used  [  ] NS 2.5% [ x ]Tropicamide  [  ] Cyclogyl [  ] Cyclomydril     External:   OD:  Normal      OS:  Normal     Anterior segment exam:  By penlight     Conjunctiva:  OD:  Quiet      OS:  Quiet     Cornea:    OD: Clear, no fluorescein stain, epithelial irregularity    OS: Clear, no fluorescein stain      Anterior Chamber:   OD:  Deep/quiet      OS:  Deep/quiet     Iris:    OD:  Normal       OS:  Normal      Lens:    OD:  1+ NS         OS:  1+ NS        Optic disc:  OD:  Flat, sharp, pink, healthy   0.3  OS:  Temporal pallor, increased c/d ratio 0.8    Central retina--examined with indirect ophthalmoscope:  OD:  Macula and vessels normal; media clear      OS:  Attenuated vessels       Impression:     1) History of likely retinal vein occlusion with associated retinal ischemia 2) Glaucoma left eye  3) Dry Eye OD>OS 4) DMII without retinopathy Recommendations/Plan:  1) follow up with [facility] for longstanding vision loss left eye associated with retinal ischemia 2) IOP stable for increased c/d- likely association from vascular occlusion OS 2) artificial tears OU  I've discussed these findings with the nurse. Please contact our office with any questions or concerns at [place], MD

## 2019-11-24 DIAGNOSIS — D649 Anemia, unspecified: Secondary | ICD-10-CM

## 2019-11-24 DIAGNOSIS — D500 Iron deficiency anemia secondary to blood loss (chronic): Secondary | ICD-10-CM

## 2019-11-24 DIAGNOSIS — N183 Chronic kidney disease, stage 3 unspecified: Secondary | ICD-10-CM

## 2019-11-24 DIAGNOSIS — R634 Abnormal weight loss: Secondary | ICD-10-CM

## 2019-11-24 DIAGNOSIS — D7589 Other specified diseases of blood and blood-forming organs: Secondary | ICD-10-CM

## 2019-11-24 LAB — COMPREHENSIVE METABOLIC PANEL
ALT: 15 U/L (ref 0–44)
AST: 23 U/L (ref 15–41)
Albumin: 2.1 g/dL — ABNORMAL LOW (ref 3.5–5.0)
Alkaline Phosphatase: 35 U/L — ABNORMAL LOW (ref 38–126)
Anion gap: 8 (ref 5–15)
BUN: 12 mg/dL (ref 8–23)
CO2: 21 mmol/L — ABNORMAL LOW (ref 22–32)
Calcium: 8.6 mg/dL — ABNORMAL LOW (ref 8.9–10.3)
Chloride: 104 mmol/L (ref 98–111)
Creatinine, Ser: 1.08 mg/dL (ref 0.61–1.24)
GFR calc Af Amer: >60 mL/min (ref >60)
GFR calc non Af Amer: >60 mL/min (ref >60)
Glucose, Bld: 94 mg/dL (ref 70–99)
Potassium: 4.2 mmol/L (ref 3.5–5.1)
Sodium: 133 mmol/L — ABNORMAL LOW (ref 135–145)
Total Bilirubin: 0.5 mg/dL (ref 0.3–1.2)
Total Protein: 5 g/dL — ABNORMAL LOW (ref 6.5–8.1)

## 2019-11-24 LAB — CBC
HCT: 23 % — ABNORMAL LOW (ref 39.0–52.0)
Hemoglobin: 7.6 g/dL — ABNORMAL LOW (ref 13.0–17.0)
MCH: 32.6 pg (ref 26.0–34.0)
MCHC: 33 g/dL (ref 30.0–36.0)
MCV: 98.7 fL (ref 80.0–100.0)
Platelets: 180 10*3/uL (ref 150–400)
RBC: 2.33 MIL/uL — ABNORMAL LOW (ref 4.22–5.81)
RDW: 16.7 % — ABNORMAL HIGH (ref 11.5–15.5)
WBC: 4.2 10*3/uL (ref 4.0–10.5)
nRBC: 0 % (ref 0.0–0.2)

## 2019-11-24 LAB — EPSTEIN BARR VRS(EBV DNA BY PCR)

## 2019-11-24 LAB — PREPARE RBC (CROSSMATCH)

## 2019-11-24 MED ORDER — CYANOCOBALAMIN 1000 MCG/ML IJ SOLN
1000.0000 ug | Freq: Every day | INTRAMUSCULAR | Status: DC
  Administered 2019-11-24 – 2019-11-26 (×3): 1000 ug via INTRAMUSCULAR
  Filled 2019-11-24 (×3): qty 1

## 2019-11-24 MED ORDER — SODIUM CHLORIDE 0.9% IV SOLUTION: Freq: Once | INTRAVENOUS | Status: AC

## 2019-11-24 NOTE — Progress Notes (Signed)
[facility] for Infectious Disease   Reason for visit: Follow up on fever  Interval History: remains afebrile with a normal WBC.  No new symptoms.  No new complaints.    Physical Exam: Constitutional:  Vitals:   11/24/19 0800 11/24/19 0825  BP:  116/84  Pulse: 82 89  Resp:  18  Temp:  99.7 F (37.6 C)  SpO2: 98% 98%   patient appears in NAD Respiratory: Normal respiratory effort; CTA B Cardiovascular: RRR GI: soft, nt, nd  Review of Systems: Constitutional: negative for fevers and chills Gastrointestinal: negative for nausea and diarrhea Integument/breast: negative for rash  Lab Results  Component Value Date   WBC 4.2 11/24/2019   HGB 7.6 (L) 11/24/2019   HCT 23.0 (L) 11/24/2019   MCV 98.7 11/24/2019   PLT 180 11/24/2019    Lab Results  Component Value Date   CREATININE 1.08 11/24/2019   BUN 12 11/24/2019   NA 133 (L) 11/24/2019   K 4.2 11/24/2019   CL 104 11/24/2019   CO2 21 (L) 11/24/2019    Lab Results  Component Value Date   ALT 15 11/24/2019   AST 23 11/24/2019   ALKPHOS 35 (L) 11/24/2019     Microbiology: Recent Results (from the past 240 hour(s))  Urine culture     Status: Abnormal   Collection Time: 11/20/19  2:58 PM   Specimen: In/Out Cath Urine  Result Value Ref Range Status   Specimen Description IN/OUT CATH URINE  Final   Special Requests   Final    NONE Performed at [facility], [address], [place], [place] [address]    Culture MULTIPLE SPECIES PRESENT, SUGGEST RECOLLECTION (A)  Final   Report Status 11/21/2019 FINAL  Final  Blood Culture (routine x 2)     Status: None (Preliminary result)   Collection Time: 11/20/19  3:47 PM   Specimen: BLOOD  Result Value Ref Range Status   Specimen Description BLOOD LEFT ANTECUBITAL  Final   Special Requests   Final    BOTTLES DRAWN AEROBIC AND ANAEROBIC Blood Culture adequate volume   Culture   Final    NO GROWTH 4 DAYS Performed at [facility], [place] [address]  [place], [place] [address]    Report Status PENDING  Incomplete  SARS Coronavirus 2 by RT PCR (hospital order, performed in [facility] hospital lab) Nasopharyngeal Nasopharyngeal Swab     Status: None   Collection Time: 11/20/19  4:12 PM   Specimen: Nasopharyngeal Swab  Result Value Ref Range Status   SARS Coronavirus 2 NEGATIVE NEGATIVE Final    Comment: (NOTE) SARS-CoV-2 target nucleic acids are NOT DETECTED.  The SARS-CoV-2 RNA is generally detectable in upper and lower respiratory specimens during the acute phase of infection. The lowest concentration of SARS-CoV-2 viral copies this assay can detect is 250 copies / mL. A negative result does not preclude SARS-CoV-2 infection and should not be used as the sole basis for treatment or other patient management decisions.  A negative result may occur with improper specimen collection / handling, submission of specimen other than nasopharyngeal swab, presence of viral mutation(s) within the areas targeted by this assay, and inadequate number of viral copies (<250 copies / mL). A negative result must be combined with clinical observations, patient history, and epidemiological information.  Fact Sheet for Patients:   StrictlyIdeas.no  Fact Sheet for Healthcare Providers: BankingDealers.co.za  This test is not yet approved or  cleared by the  [place] Architectural technologist and has been authorized for detection and/or diagnosis of SARS-CoV-2 by FDA under an Print production planner (EUA).  This EUA will remain in effect (meaning this test can be used) for the duration of the COVID-19 declaration under Section 564(b)(1) of the Act, 21 U.S.C. section 360bbb-3(b)(1), unless the authorization is terminated or revoked sooner.  Performed at [facility], [place] [address] [place], [place] [address]   Blood Culture (routine x 2)     Status: None (Preliminary result)   Collection Time: 11/20/19 10:35 PM    Specimen: BLOOD LEFT HAND  Result Value Ref Range Status   Specimen Description BLOOD LEFT HAND  Final   Special Requests   Final    BOTTLES DRAWN AEROBIC AND ANAEROBIC Blood Culture adequate volume   Culture   Final    NO GROWTH 4 DAYS Performed at [facility], [place] [address], [place], [place] [address]    Report Status PENDING  Incomplete  Respiratory Panel by PCR     Status: None   Collection Time: 11/21/19  5:56 AM   Specimen: Nasopharyngeal Swab; Respiratory  Result Value Ref Range Status   Adenovirus NOT DETECTED NOT DETECTED Final   Coronavirus 229E NOT DETECTED NOT DETECTED Final    Comment: (NOTE) The Coronavirus on the Respiratory Panel, DOES NOT test for the novel  Coronavirus (2019 nCoV)    Coronavirus HKU1 NOT DETECTED NOT DETECTED Final   Coronavirus NL63 NOT DETECTED NOT DETECTED Final   Coronavirus OC43 NOT DETECTED NOT DETECTED Final   Metapneumovirus NOT DETECTED NOT DETECTED Final   Rhinovirus / Enterovirus NOT DETECTED NOT DETECTED Final   Influenza A NOT DETECTED NOT DETECTED Final   Influenza B NOT DETECTED NOT DETECTED Final   Parainfluenza Virus 1 NOT DETECTED NOT DETECTED Final   Parainfluenza Virus 2 NOT DETECTED NOT DETECTED Final   Parainfluenza Virus 3 NOT DETECTED NOT DETECTED Final   Parainfluenza Virus 4 NOT DETECTED NOT DETECTED Final   Respiratory Syncytial Virus NOT DETECTED NOT DETECTED Final   Bordetella pertussis NOT DETECTED NOT DETECTED Final   Chlamydophila pneumoniae NOT DETECTED NOT DETECTED Final   Mycoplasma pneumoniae NOT DETECTED NOT DETECTED Final    Comment: Performed at [facility], [place] [address], [place], [place] [address]    Impression/Plan:  1. Fever - resolved.  Most c/w a viral etiology. No concerning signs on exam or history.  Continue supportive care.  2. Visual loss - resolved.  Seen by ophthalmology and will need follow up at discharge.    I will sign off, call with questions

## 2019-11-24 NOTE — Progress Notes (Signed)
[facility]                                                                              Patient Demographics  Travis [name], is a 64 y.o. male, DOB - 03-Jun-1955, TWS:[id number]  Admit date - 11/20/2019   Admitting Physician Orene Desanctis, DO  Outpatient Primary MD for the patient is Garnette Gunner Coralie Keens, NP  Outpatient specialists:   LOS - 4  days   Medical records reviewed and are as summarized below:    Chief Complaint  Patient presents with  . Back Pain       Brief summary   Travis E Pettyis a 64 y.o.malewith medical history significant fornonischemic cardiomyopathy, history of left central retinal occlusion, chronic systolic heart failure, COPD, TIA, type 2 diabetes, hypertension, hyperlipidemia who presents with concerns of bilateral eye soreness, blurry vision, dizziness and lower back pain. He washospitalized overnight on 11/08/2019 to 11/09/2019 and at that time he presented with dizziness, transient bilateral vision loss and scapular pain. Ophthalmology was consulted and recommended outpatient follow-up. He was admitted for TIA work-up and had negative CT and negative MRI. He was also notably found to be anemic with hemoglobin down to 9 from a prior of 12.9 in May. He had negative FOBT at that time. Also was found to be hypotensive initially and had his Coreg dose decreased. He is followed by cardiology outpatient and had his spironolactone discontinued. Patient states that he has been following this new medication regimen. In ED, Tmax 103, acute cardiac, tachypneic, hypotensive, systolic BP 00F over 74B.  Worsening anemia, hemoglobin down to 6.6, transfused 1 unit packed RBCs CT abdomen pelvis, chest negative.  Patient was admitted for further work-up  Assessment & Plan    Principal Problem:   Hypotension, possibly iatrogenic -BP continues to be soft, 100/61 this a.m., no dizziness or lightheadedness. Continue to hold Entresto, Coreg  Bilateral eye  pain with blurry vision - dry eyes per ophthalmology -Seen by ophthalmology, Dr. Posey Pronto, recommended Liquifilm Tears 3 times daily -Outpatient follow-up with [facility] clinic ophthalmology  Fever of unknown origin, most consistent with viral etiology -Afebrile, no leukocytosis, no new symptoms.  ID following -COVID-19 negative, respiratory virus panel negative, blood cultures negative so far -UA, CTA chest negative. -Patient was placed on empiric antibiotics, ID was consulted.  Seen by Dr. Linus Salmons -Antibiotics discontinued,  supportive care  Severe iron deficiency anemia, macrocytosis -FOBT negative on 8/15, iron studies suggestive of iron deficiency -Received 1 dose of IV Feraheme on 8/28, started on ferrous sulfate, received 1 unit packed RBCs on 8/27 -Hemoglobin down to 7.6 again today, previous colonoscopy in 2018 had shown diverticulosis -Patient reported 11lbs weight loss in the last 2 months, loss of appetite, B12 borderline low 193, folate 14 -Consulted with GI, plan for EGD tomorrow, n.p.o. after midnight -Placed on B12 replacement, continue folate  Chronic systolic CHF -2D echo on 4/49 had shown EF of 45 to 50% -Currently stable, euvolemic  Diabetes mellitus type 2, controlled Hemoglobin A1c 7.3, CBGs stable  Tobacco use disorder -Counseled on smoking cessation  Alcohol use disorder -Last use 2 weeks prior to presentation, currently  no alcohol withdrawals   Moderate protein calorie malnutrition  Albumin 2.1 Estimated body mass index is 21.23 kg/m as calculated from the following:   Height as of this encounter: 6\' 4"  (1.93 m).   Weight as of this encounter: 79.1 kg.  Code Status: Full CODE STATUS DVT Prophylaxis:  SCD's Family Communication: Discussed all imaging results, lab results, explained to the patient   Disposition Plan:     Status is: Inpatient  Remains inpatient appropriate because:Inpatient level of care appropriate due to severity of  illness   Dispo:  Patient From: Home  Planned Disposition: Home  Expected discharge date: 11/25/19  Medically stable for discharge: No,  pending EGD in a.m.       Time Spent in minutes   35 minutes  Procedures:  None  Consultants:   Ophthalmology ID Gastroenterology  Antimicrobials:   Anti-infectives (From admission, onward)   Start     Dose/Rate Route Frequency Ordered Stop   11/22/19 1500  ceFEPIme (MAXIPIME) 2 g in sodium chloride 0.9 % 100 mL IVPB  Status:  Discontinued        2 g 200 mL/hr over 30 Minutes Intravenous Every 8 hours 11/22/19 1449 11/23/19 1055   11/21/19 1700  vancomycin (VANCOCIN) IVPB 1000 mg/200 mL premix  Status:  Discontinued       "Followed by" Linked Group Details   1,000 mg 200 mL/hr over 60 Minutes Intravenous Every 24 hours 11/20/19 1646 11/21/19 1052   11/21/19 1100  vancomycin (VANCOREADY) IVPB 750 mg/150 mL  Status:  Discontinued        750 mg 150 mL/hr over 60 Minutes Intravenous Every 12 hours 11/21/19 1052 11/22/19 1535   11/21/19 0400  ceFEPIme (MAXIPIME) 2 g in sodium chloride 0.9 % 100 mL IVPB  Status:  Discontinued        2 g 200 mL/hr over 30 Minutes Intravenous Every 12 hours 11/20/19 1646 11/22/19 1449   11/20/19 1645  vancomycin (VANCOREADY) IVPB 500 mg/100 mL       "Followed by" Linked Group Details   500 mg 100 mL/hr over 60 Minutes Intravenous  Once 11/20/19 1646 11/20/19 1834   11/20/19 1545  ceFEPIme (MAXIPIME) 2 g in sodium chloride 0.9 % 100 mL IVPB        2 g 200 mL/hr over 30 Minutes Intravenous  Once 11/20/19 1536 11/20/19 1644   11/20/19 1545  metroNIDAZOLE (FLAGYL) IVPB 500 mg  Status:  Discontinued        500 mg 100 mL/hr over 60 Minutes Intravenous  Once 11/20/19 1536 11/20/19 1559   11/20/19 1545  vancomycin (VANCOCIN) IVPB 1000 mg/200 mL premix        1,000 mg 200 mL/hr over 60 Minutes Intravenous  Once 11/20/19 1537 11/20/19 1734         Medications  Scheduled Meds: . sodium chloride    Intravenous Once  . allopurinol  200 mg Oral Daily  . aspirin EC  162 mg Oral Daily  . atorvastatin  20 mg Oral Daily  . cyanocobalamin  1,000 mcg Intramuscular Daily  . dextrose  1 ampule Intravenous Once  . fenofibrate  160 mg Oral Daily  . ferrous sulfate  325 mg Oral Q breakfast  . folic acid  0.5 mg Oral Daily  . polyvinyl alcohol  1 drop Both Eyes TID  . senna-docusate  2 tablet Oral BID   Continuous Infusions: . dextrose    . magnesium sulfate bolus IVPB    . potassium  chloride Stopped (11/20/19 1620)   PRN Meds:.acetaminophen, albuterol, oxyCODONE      Subjective:   Desmond Szabo was seen and examined today.  Complaining of headache typical of his migraines this morning, 7/10.  No dizziness, lightheadedness or chest pain. + Generalized weakness.  Patient denies dizziness, chest pain, shortness of breath, abdominal pain, N/V/D/C. No acute events overnight.    Objective:   Vitals:   11/24/19 0018 11/24/19 0423 11/24/19 0800 11/24/19 0825  BP:  98/69  116/84  Pulse:  87 82 89  Resp:  19  18  Temp: 98.8 F (37.1 C) 99.1 F (37.3 C)  99.7 F (37.6 C)  TempSrc:  Oral  Oral  SpO2:  98% 98% 98%  Weight:  79.1 kg    Height:        Intake/Output Summary (Last 24 hours) at 11/24/2019 1133 Last data filed at 11/24/2019 0710 Gross per 24 hour  Intake 240 ml  Output 1100 ml  Net -860 ml     Wt Readings from Last 3 Encounters:  11/24/19 79.1 kg  11/17/19 78.1 kg  11/08/19 79.4 kg     Exam  General: Alert and oriented x 3, NAD  Cardiovascular: S1 S2 auscultated, no murmurs, RRR  Respiratory: Clear to auscultation bilaterally, no wheezing, rales or rhonchi  Gastrointestinal: Soft, nontender, nondistended, + bowel sounds  Ext: no pedal edema bilaterally  Neuro: no new deficits  Musculoskeletal: No digital cyanosis, clubbing  Skin: No rashes  Psych: Normal affect and demeanor, alert and oriented x3    Data Reviewed:  I have personally reviewed  following labs and imaging studies  Micro Results Recent Results (from the past 240 hour(s))  Urine culture     Status: Abnormal   Collection Time: 11/20/19  2:58 PM   Specimen: In/Out Cath Urine  Result Value Ref Range Status   Specimen Description IN/OUT CATH URINE  Final   Special Requests   Final    NONE Performed at [facility], [address], [place], [place] [address]    Culture MULTIPLE SPECIES PRESENT, SUGGEST RECOLLECTION (A)  Final   Report Status 11/21/2019 FINAL  Final  Blood Culture (routine x 2)     Status: None (Preliminary result)   Collection Time: 11/20/19  3:47 PM   Specimen: BLOOD  Result Value Ref Range Status   Specimen Description BLOOD LEFT ANTECUBITAL  Final   Special Requests   Final    BOTTLES DRAWN AEROBIC AND ANAEROBIC Blood Culture adequate volume   Culture   Final    NO GROWTH 4 DAYS Performed at [facility], [place] [address] [place],  [address]    Report Status PENDING  Incomplete  SARS Coronavirus 2 by RT PCR (hospital order, performed in [facility] hospital lab) Nasopharyngeal Nasopharyngeal Swab     Status: None   Collection Time: 11/20/19  4:12 PM   Specimen: Nasopharyngeal Swab  Result Value Ref Range Status   SARS Coronavirus 2 NEGATIVE NEGATIVE Final    Comment: (NOTE) SARS-CoV-2 target nucleic acids are NOT DETECTED.  The SARS-CoV-2 RNA is generally detectable in upper and lower respiratory specimens during the acute phase of infection. The lowest concentration of SARS-CoV-2 viral copies this assay can detect is 250 copies / mL. A negative result does not preclude SARS-CoV-2 infection and should not be used as the sole basis for treatment or other patient management decisions.  A negative result may occur with improper specimen collection / handling, submission  of specimen other than nasopharyngeal swab, presence of viral mutation(s) within the areas targeted by this assay, and inadequate number of viral  copies (<250 copies / mL). A negative result must be combined with clinical observations, patient history, and epidemiological information.  Fact Sheet for Patients:   StrictlyIdeas.no  Fact Sheet for Healthcare Providers: BankingDealers.co.za  This test is not yet approved or  cleared by the [place] FDA and has been authorized for detection and/or diagnosis of SARS-CoV-2 by FDA under an Emergency Use Authorization (EUA).  This EUA will remain in effect (meaning this test can be used) for the duration of the COVID-19 declaration under Section 564(b)(1) of the Act, 21 U.S.C. section 360bbb-3(b)(1), unless the authorization is terminated or revoked sooner.  Performed at [facility], [place] [address], [place], [place] [address]   Blood Culture (routine x 2)     Status: None (Preliminary result)   Collection Time: 11/20/19 10:35 PM   Specimen: BLOOD LEFT HAND  Result Value Ref Range Status   Specimen Description BLOOD LEFT HAND  Final   Special Requests   Final    BOTTLES DRAWN AEROBIC AND ANAEROBIC Blood Culture adequate volume   Culture   Final    NO GROWTH 4 DAYS Performed at [facility], [place] [address], [place], [place] [address]    Report Status PENDING  Incomplete  Respiratory Panel by PCR     Status: None   Collection Time: 11/21/19  5:56 AM   Specimen: Nasopharyngeal Swab; Respiratory  Result Value Ref Range Status   Adenovirus NOT DETECTED NOT DETECTED Final   Coronavirus 229E NOT DETECTED NOT DETECTED Final    Comment: (NOTE) The Coronavirus on the Respiratory Panel, DOES NOT test for the novel  Coronavirus (2019 nCoV)    Coronavirus HKU1 NOT DETECTED NOT DETECTED Final   Coronavirus NL63 NOT DETECTED NOT DETECTED Final   Coronavirus OC43 NOT DETECTED NOT DETECTED Final   Metapneumovirus NOT DETECTED NOT DETECTED Final   Rhinovirus / Enterovirus NOT DETECTED NOT DETECTED Final   Influenza A NOT  DETECTED NOT DETECTED Final   Influenza B NOT DETECTED NOT DETECTED Final   Parainfluenza Virus 1 NOT DETECTED NOT DETECTED Final   Parainfluenza Virus 2 NOT DETECTED NOT DETECTED Final   Parainfluenza Virus 3 NOT DETECTED NOT DETECTED Final   Parainfluenza Virus 4 NOT DETECTED NOT DETECTED Final   Respiratory Syncytial Virus NOT DETECTED NOT DETECTED Final   Bordetella pertussis NOT DETECTED NOT DETECTED Final   Chlamydophila pneumoniae NOT DETECTED NOT DETECTED Final   Mycoplasma pneumoniae NOT DETECTED NOT DETECTED Final    Comment: Performed at [facility], [place], [place], [place] [address]    Radiology Reports CT Head Wo Contrast  Result Date: 11/08/2019 CLINICAL DATA:  Transient ischemic attack (TIA) Patient reports acute onset of nausea, vomiting and vision changes. EXAM: CT HEAD WITHOUT CONTRAST TECHNIQUE: Contiguous axial images were obtained from the base of the skull through the vertex without intravenous contrast. COMPARISON:  Head CT 07/26/2019 FINDINGS: Brain: Stable degree of atrophy and chronic small vessel ischemia. No intracranial hemorrhage, mass effect, or midline shift. No hydrocephalus. The basilar cisterns are patent. No evidence of territorial infarct or acute ischemia. No extra-axial or intracranial fluid collection. Vascular: No hyperdense vessel. Skull: No fracture or focal lesion. Sinuses/Orbits: Paranasal sinuses and mastoid air cells are clear. The visualized orbits are unremarkable. Other: None. IMPRESSION: 1. No acute intracranial abnormality. 2. Stable atrophy and  chronic small vessel ischemia. Electronically Signed   By: Keith Rake M.D.   On: 11/08/2019 19:24   MR BRAIN W WO CONTRAST  Result Date: 11/09/2019 CLINICAL DATA:  Episode of binocular vision loss EXAM: MRI HEAD WITHOUT AND WITH CONTRAST TECHNIQUE: Multiplanar, multiecho pulse sequences of the brain and surrounding structures were obtained without and with intravenous contrast.  CONTRAST:  54mL GADAVIST GADOBUTROL 1 MMOL/ML IV SOLN COMPARISON:  2015 FINDINGS: Brain: There is no acute infarction or intracranial hemorrhage. There is no intracranial mass, mass effect, or edema. There is no hydrocephalus or extra-axial fluid collection. Prominence of the ventricles and sulci reflects mild generalized parenchymal volume loss, with some progression since 2015. Patchy T2 hyperintensity in the supratentorial white matter is nonspecific but probably reflects mild chronic microvascular ischemic changes similar to prior study. No abnormal enhancement. Vascular: Major vessel flow voids at the skull base are preserved. Skull and upper cervical spine: Normal marrow signal is preserved. Sinuses/Orbits: Minor mucosal thickening.  Orbits are unremarkable. Other: Sella is unremarkable. Trace right mastoid fluid opacification. IMPRESSION: No evidence of recent infarction, hemorrhage, mass, or abnormal enhancement. Stable chronic microvascular ischemic changes. Electronically Signed   By: Macy Mis M.D.   On: 11/09/2019 10:17   DG Chest Port 1 View  Result Date: 11/20/2019 CLINICAL DATA:  Questionable sepsis. EXAM: PORTABLE CHEST 1 VIEW COMPARISON:  11/08/2019 FINDINGS: Heart and mediastinal contours are within normal limits. No focal opacities or effusions. No acute bony abnormality. IMPRESSION: No active disease. Electronically Signed   By: Rolm Baptise M.D.   On: 11/20/2019 15:53   DG Chest Port 1 View  Result Date: 11/08/2019 CLINICAL DATA:  Vomiting. EXAM: PORTABLE CHEST 1 VIEW COMPARISON:  Jul 26, 2019 FINDINGS: There is no evidence of acute infiltrate, pleural effusion or pneumothorax. The heart size and mediastinal contours are within normal limits. An intact right shoulder replacement is seen. The visualized skeletal structures are otherwise unremarkable. IMPRESSION: No active disease. Electronically Signed   By: Virgina Norfolk M.D.   On: 11/08/2019 16:13   ECHOCARDIOGRAM  COMPLETE  Result Date: 11/09/2019    ECHOCARDIOGRAM REPORT   Patient Name:   ARRIS MEYN Rasp Date of Exam: 11/09/2019 Medical Rec #:  [id number]     Height:       76.0 in Accession #:    [id number]    Weight:       175.0 lb Date of Birth:  09-26-1955    BSA:          2.093 m Patient Age:    20 years      BP:           115/75 mmHg Patient Gender: M             HR:           74 bpm. Exam Location:  Inpatient Procedure: 2D Echo Indications:    TIA 435.9  History:        Patient has prior history of Echocardiogram examinations, most                 recent 06/04/2019. Cardiomyopathy, CAD, TIA; Risk                 Factors:Diabetes, Hypertension, Dyslipidemia and Current Smoker.                 ETOH Abuse. Alcoholic Cardiomyopathy. tobacco abuse.  Sonographer:    Jannett Celestine RDCS (AE) Referring Phys: [place]  1. Compared to echo done in March 2021 EF improved and thickening of MV and subchordal apparatus more prominent.  2. Left ventricular ejection fraction, by estimation, is 45 to 50%. The left ventricle has mildly decreased function. The left ventricle demonstrates regional wall motion abnormalities (see scoring diagram/findings for description). There is mild left ventricular hypertrophy. Left ventricular diastolic parameters are indeterminate.  3. Enlarged basal annulus . Right ventricular systolic function is mildly reduced. The right ventricular size is mildly enlarged.  4. Right atrial size was mildly dilated.  5. MV is thickened cannot r/o SBE but likely reducndant sub chordal apparatus consider TEE to further evaluate if suspicion of SBE high . The mitral valve is abnormal. Trivial mitral valve regurgitation. No evidence of mitral stenosis.  6. The aortic valve was not well visualized. Aortic valve regurgitation is not visualized. Mild to moderate aortic valve sclerosis/calcification is present, without any evidence of aortic stenosis.  7. The inferior vena cava is normal in size with  greater than 50% respiratory variability, suggesting right atrial pressure of 3 mmHg. FINDINGS  Left Ventricle: Left ventricular ejection fraction, by estimation, is 45 to 50%. The left ventricle has mildly decreased function. The left ventricle demonstrates regional wall motion abnormalities. The left ventricular internal cavity size was normal in size. There is mild left ventricular hypertrophy. Left ventricular diastolic parameters are indeterminate. Right Ventricle: Enlarged basal annulus. The right ventricular size is mildly enlarged. Right vetricular wall thickness was not assessed. Right ventricular systolic function is mildly reduced. Left Atrium: Left atrial size was normal in size. Right Atrium: Right atrial size was mildly dilated. Pericardium: There is no evidence of pericardial effusion. Mitral Valve: MV is thickened cannot r/o SBE but likely reducndant sub chordal apparatus consider TEE to further evaluate if suspicion of SBE high. The mitral valve is abnormal. There is moderate thickening of the mitral valve leaflet(s). There is mild calcification of the mitral valve leaflet(s). Normal mobility of the mitral valve leaflets. Trivial mitral valve regurgitation. No evidence of mitral valve stenosis. Tricuspid Valve: The tricuspid valve is normal in structure. Tricuspid valve regurgitation is mild . No evidence of tricuspid stenosis. Aortic Valve: The aortic valve was not well visualized. Aortic valve regurgitation is not visualized. Mild to moderate aortic valve sclerosis/calcification is present, without any evidence of aortic stenosis. Pulmonic Valve: The pulmonic valve was normal in structure. Pulmonic valve regurgitation is not visualized. No evidence of pulmonic stenosis. Aorta: The aortic root is normal in size and structure. Venous: The inferior vena cava is normal in size with greater than 50% respiratory variability, suggesting right atrial pressure of 3 mmHg. IAS/Shunts: No atrial level shunt  detected by color flow Doppler. Additional Comments: Compared to echo done in March 2021 EF improved and thickening of MV and subchordal apparatus more prominent.  LEFT VENTRICLE PLAX 2D LVIDd:         4.30 cm  Diastology LVIDs:         2.90 cm  LV e' lateral:   11.40 cm/s LV PW:         1.20 cm  LV E/e' lateral: 5.5 LV IVS:        1.20 cm  LV e' medial:    5.66 cm/s LVOT diam:     2.20 cm  LV E/e' medial:  11.1 LV SV:         57 LV SV Index:   27 LVOT Area:     3.80 cm  RIGHT VENTRICLE RV S prime:  12.50 cm/s TAPSE (M-mode): 1.9 cm LEFT ATRIUM           Index       RIGHT ATRIUM           Index LA diam:      2.70 cm 1.29 cm/m  RA Area:     17.10 cm LA Vol (A2C): 36.1 ml 17.25 ml/m RA Volume:   44.00 ml  21.02 ml/m  AORTIC VALVE LVOT Vmax:   81.60 cm/s LVOT Vmean:  63.500 cm/s LVOT VTI:    0.151 m  AORTA Ao Root diam: 3.00 cm MITRAL VALVE MV Area (PHT): 3.83 cm    SHUNTS MV Decel Time: 198 msec    Systemic VTI:  0.15 m MV E velocity: 63.00 cm/s  Systemic Diam: 2.20 cm MV A velocity: 67.70 cm/s MV E/A ratio:  0.93 Jenkins Rouge MD Electronically signed by Jenkins Rouge MD Signature Date/Time: 11/09/2019/12:18:29 PM    Final    CT Angio Chest/Abd/Pel for Dissection W and/or Wo Contrast  Result Date: 11/20/2019 CLINICAL DATA:  Abdominal pain question aortic dissection EXAM: CT ANGIOGRAPHY CHEST, ABDOMEN AND PELVIS TECHNIQUE: Non-contrast CT of the chest was initially obtained. Multidetector CT imaging through the chest, abdomen and pelvis was performed using the standard protocol during bolus administration of intravenous contrast. Multiplanar reconstructed images and MIPs were obtained and reviewed to evaluate the vascular anatomy. CONTRAST:  142mL OMNIPAQUE IOHEXOL 350 MG/ML SOLN IV COMPARISON:  CT chest abdomen pelvis 07/26/2019 FINDINGS: CTA CHEST FINDINGS Cardiovascular: Scattered atherosclerotic calcifications aorta, proximal great vessels, and coronary arteries. Aorta normal caliber. No intramural  hematoma on precontrast imaging. Normal aortic enhancement following contrast without evidence of dissection. Heart unremarkable. No pericardial effusion. Pulmonary arteries grossly patent on non targeted exam. Mediastinum/Nodes: Base of cervical region normal appearance. Esophagus unremarkable. Multiple normal and upper normal sized mediastinal lymph nodes. 11 mm RIGHT paratracheal node image 59, unchanged. 12 mm RIGHT paratracheal node image 52, previously 10 mm. No hilar or axillary adenopathy. BILATERAL gynecomastia. Lungs/Pleura: Linear subsegmental atelectasis BILATERAL lower lobes. Lungs otherwise clear. No infiltrate, pleural effusion or pneumothorax. Musculoskeletal: RIGHT shoulder prosthesis. Degenerative changes LEFT glenohumeral joint. Degenerative disc disease changes lower cervical spine. Review of the MIP images confirms the above findings. CTA ABDOMEN AND PELVIS FINDINGS VASCULAR Aorta: Scattered atherosclerotic calcifications abdominal aorta. Minimal eccentric noncalcified plaque. Normal caliber without aneurysm or dissection. Celiac: Widely patent SMA: Widely patent Renals: Patent BILATERAL renal arteries. Accessory renal artery to upper pole LEFT kidney. IMA: Patent Inflow: Scattered atherosclerotic calcifications in the iliac arteries and common femoral arteries without high-grade stenosis Veins: Predominately unopacified at time of CTA imaging Review of the MIP images confirms the above findings. NON-VASCULAR Hepatobiliary: Contracted gallbladder with single tiny gallstone. Liver unremarkable. Pancreas: Normal appearance Spleen: Normal appearance Adrenals/Urinary Tract: Adrenal glands normal appearance. Cortical thinning in the kidneys. Tiny LEFT renal cyst. Kidneys and ureters otherwise unremarkable. Bladder decompressed. Stomach/Bowel: Scattered colonic diverticula greatest at ascending colon. No evidence of diverticulitis. Normal appendix. Stomach and bowel loops otherwise normal appearance.  Lymphatic: No adenopathy Reproductive: Unremarkable prostate gland and seminal vesicles Other: No free air or free fluid. Small umbilical hernia containing fat. No inguinal hernias. No inflammatory process. Musculoskeletal: RIGHT hip prosthesis. Soft tissue calcifications adjacent to proximal RIGHT femur. Bones demineralized with degenerative disc disease changes of the lumbar spine. Review of the MIP images confirms the above findings. IMPRESSION: Atherosclerotic calcifications of aorta, proximal great vessels, and coronary arteries. No evidence of aortic aneurysm or dissection. Linear subsegmental  atelectasis BILATERAL lower lobes. Scattered colonic diverticulosis without evidence of diverticulitis. Small umbilical hernia containing fat. Cholelithiasis. Nonspecific minimally enlarged mediastinal lymph nodes little changed from 07/26/2019. Aortic Atherosclerosis (ICD10-I70.0). Electronically Signed   By: Lavonia Dana M.D.   On: 11/20/2019 16:02   VAS US CAROTID (at [facility] and [facility] only)  Result Date: 11/09/2019 Carotid Arterial Duplex Study Indications:       TIA. Comparison Study:  No prior studies. Performing Technologist: Oliver Hum RVT  Examination Guidelines: A complete evaluation includes B-mode imaging, spectral Doppler, color Doppler, and power Doppler as needed of all accessible portions of each vessel. Bilateral testing is considered an integral part of a complete examination. Limited examinations for reoccurring indications may be performed as noted.  Right Carotid Findings: +----------+--------+--------+--------+-----------------------+--------+           PSV cm/sEDV cm/sStenosisPlaque Description     Comments +----------+--------+--------+--------+-----------------------+--------+ CCA Prox  78      18              smooth and heterogenoustortuous +----------+--------+--------+--------+-----------------------+--------+ CCA Distal46      18              smooth and heterogenous          +----------+--------+--------+--------+-----------------------+--------+ ICA Prox  44      10              smooth and heterogenous         +----------+--------+--------+--------+-----------------------+--------+ ICA Mid                                                  tortuous +----------+--------+--------+--------+-----------------------+--------+ ICA Distal92      40                                     tortuous +----------+--------+--------+--------+-----------------------+--------+ ECA       47      10                                              +----------+--------+--------+--------+-----------------------+--------+ +----------+--------+-------+--------+-------------------+           PSV cm/sEDV cmsDescribeArm Pressure (mmHG) +----------+--------+-------+--------+-------------------+ [id number]                                         +----------+--------+-------+--------+-------------------+ +---------+--------+--+--------+--+---------+ VertebralPSV cm/s26EDV cm/s10Antegrade +---------+--------+--+--------+--+---------+  Left Carotid Findings: +----------+--------+--------+--------+-----------------------+--------+           PSV cm/sEDV cm/sStenosisPlaque Description     Comments +----------+--------+--------+--------+-----------------------+--------+ CCA Prox  91      24              smooth and heterogenous         +----------+--------+--------+--------+-----------------------+--------+ CCA Distal64      14              smooth and heterogenous         +----------+--------+--------+--------+-----------------------+--------+ ICA Prox  61      17              smooth and heterogenoustortuous +----------+--------+--------+--------+-----------------------+--------+ ICA Distal73      31  tortuous +----------+--------+--------+--------+-----------------------+--------+ ECA       56      12                                               +----------+--------+--------+--------+-----------------------+--------+ +----------+--------+--------+--------+-------------------+           PSV cm/sEDV cm/sDescribeArm Pressure (mmHG) +----------+--------+--------+--------+-------------------+ [id number]                                          +----------+--------+--------+--------+-------------------+ +---------+--------+--+--------+-+---------+ VertebralPSV cm/s36EDV cm/s8Antegrade +---------+--------+--+--------+-+---------+   Summary: Right Carotid: Velocities in the right ICA are consistent with a 1-39% stenosis. Left Carotid: Velocities in the left ICA are consistent with a 1-39% stenosis. Vertebrals: Bilateral vertebral arteries demonstrate antegrade flow. *See table(s) above for measurements and observations.  Electronically signed by Antony Contras MD on 11/09/2019 at 3:09:41 PM.    Final     Lab Data:  CBC: Recent Labs  Lab 11/20/19 1600 11/21/19 0554 11/22/19 0741 11/23/19 0249 11/24/19 0433  WBC 5.6 4.5 4.5 4.3 4.2  NEUTROABS 4.3  --   --   --   --   HGB 6.6* 7.9* 7.9* 7.9* 7.6*  HCT 21.4* 24.6* 23.5* 24.4* 23.0*  MCV 109.2* 102.5* 100.4* 100.4* 98.7  PLT 186 175 156 155 115   Basic Metabolic Panel: Recent Labs  Lab 11/20/19 1600 11/21/19 0554 11/22/19 0013 11/23/19 0249 11/24/19 0433  NA 132* 135 132* 134* 133*  K 4.8 4.6 4.3 4.4 4.2  CL 105 107 108 107 104  CO2 20* 18* 17* 20* 21*  GLUCOSE 96 101* 96 113* 94  BUN 29* 23 18 14 12   CREATININE 2.49* 1.72* 1.38* 1.29* 1.08  CALCIUM 8.1* 8.2* 8.0* 8.2* 8.6*   GFR: Estimated Creatinine Clearance: 78.3 mL/min (by C-G formula based on SCr of 1.08 mg/dL). Liver Function Tests: Recent Labs  Lab 11/20/19 1600 11/21/19 0554 11/22/19 0013 11/23/19 0249 11/24/19 0433  AST 22 20 22 23 23   ALT 18 16 15 17 15   ALKPHOS 27* 26* 27* 31* 35*  BILITOT 0.7 0.9 0.8 0.6 0.5  PROT 5.0* 4.9* 4.6* 4.9* 5.0*  ALBUMIN  2.4* 2.2* 2.1* 2.1* 2.1*   No results for input(s): LIPASE, AMYLASE in the last 168 hours. No results for input(s): AMMONIA in the last 168 hours. Coagulation Profile: Recent Labs  Lab 11/21/19 0054  INR 1.3*   Cardiac Enzymes: No results for input(s): CKTOTAL, CKMB, CKMBINDEX, TROPONINI in the last 168 hours. BNP (last 3 results) No results for input(s): PROBNP in the last 8760 hours. HbA1C: No results for input(s): HGBA1C in the last 72 hours. CBG: Recent Labs  Lab 11/20/19 1533  GLUCAP 87   Lipid Profile: No results for input(s): CHOL, HDL, LDLCALC, TRIG, CHOLHDL, LDLDIRECT in the last 72 hours. Thyroid Function Tests: No results for input(s): TSH, T4TOTAL, FREET4, T3FREE, THYROIDAB in the last 72 hours. Anemia Panel: No results for input(s): VITAMINB12, FOLATE, FERRITIN, TIBC, IRON, RETICCTPCT in the last 72 hours. Urine analysis:    Component Value Date/Time   COLORURINE YELLOW 11/20/2019 1600   APPEARANCEUR HAZY (A) 11/20/2019 1600   APPEARANCEUR Cloudy 11/19/2012 1107   LABSPEC 1.036 (H) 11/20/2019 1600   LABSPEC 1.024 11/19/2012 1107   PHURINE 5.0 11/20/2019 1600   GLUCOSEU NEGATIVE  11/20/2019 1600   GLUCOSEU Negative 11/19/2012 1107   HGBUR SMALL (A) 11/20/2019 1600   BILIRUBINUR NEGATIVE 11/20/2019 1600   BILIRUBINUR Negative 11/19/2012 1107   KETONESUR NEGATIVE 11/20/2019 1600   PROTEINUR NEGATIVE 11/20/2019 1600   UROBILINOGEN 0.2 09/14/2013 1225   NITRITE NEGATIVE 11/20/2019 1600   LEUKOCYTESUR TRACE (A) 11/20/2019 1600   LEUKOCYTESUR Negative 11/19/2012 1107     Tiny Rietz M.D. [facility] 11/24/2019, 11:33 AM   Call night coverage person covering after 7pm

## 2019-11-24 NOTE — Consult Note (Signed)
Consultation  Referring Provider:     Irene Pap DO Primary Care Physician:  [name], NP Primary Gastroenterologist:        Havery Moros Reason for Consultation:     Anemia, weight loss         HPI:   Travis [name] is a 64 y.o. male with a history of CHF, CAD, central retinal vein occlusion, TIA, COPD, admitted to the hospital on 8/27 with eye soreness / visual loss, low back pain, fevers. During the course of his workup he was noted to have a severe anemia. Baseline Hgb 11-12s. Admitted in mid August for for TIA workup, noted to have Hgb of 8.9 at that time, macrocytic, with normal iron studies and low normal B12 level of 193. Stools negative for occult blood at that time. He was discharged with outpatient workup planned, but came back on 8/27 with eye soreness, fevers. He has had a CTA of chest / abdomen / pelvis on 8/29 which did not show any acute pathology. Diverticulosis noted without diverticulitis, as well as cholelithiasis. LFTs have been normal. Albumin levels low around 2.1. Ferritin level of 652 with iron level of 16, TIBC of 239, iron sat of 7. His Hgb on admission was 6.6 with an MCV of 109. He has received RBC transfusion. Hgb has been stable in the 7s since transfusion, today 7.6.  He denies any obvious blood in his stool. Passes brown stool normally, has had some loose stools recently.[name] He has had some intermittent epigastric to R sided pain at times, does not seem prandial and overall mild. Over the past 3 months he thinks he has lost about 11 lbs. He does endorse poor appetite, not eating too much, this preceeded his hospitalization. He does have 1/2 PPD tobacco use and at baseline drinks about 1/2 pint of liquor per week, has not had a drink in a few weeks however. No prior EGD. He had a colonoscopy with me in 08/27/2016 which showed diverticulosis, hemorrhoids, and no polyps, had a good prep at the time. Infectious disease has been following along in light of his fevers.  They suspect unspecific viral illness. Fever curve has been improving, he had a low grade temp this AM, resolved with tylenol. He is comfortable at this time, no shortness of breath or chest pains. Patient has also been seen by ophthalmology for his eye soreness / visual loss.   He has been taking celebrex as an outpatient and ? Aspirin, not on any PPIs.   Past Medical History:  Diagnosis Date  . Alcohol abuse   . 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 05/2019 Echo: EF 35-40%.  . Allergy   . Asthma   . 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 Chronic anemia   . Clotting disorder ([place])   . Diabetes mellitus, type 2 ([place])    pt reports his DM is gone  . GI bleed    15 years ago  . Gout   . Hemoptysis    secondary to pulmonary edema  . HFrEF (heart failure with reduced ejection fraction) ([place])    a. 12/2010 Echo: EF 20-25%; b 08/2013 Echo: EF 45-50%; c. 01/2015 Echo: EF 25-30%; d. 07/2016 Echo: EF 35-40%; e. 05/2019 Echo: EF 35-40%, glob HK. Mild LVH. Gr1 DD. Nl RV fxn.  . Hyperlipidemia   . Hypertension   . Nonischemic cardiomyopathy ([place])   . Nonobstructive Coronary artery disease    a. 01/2015  Cath: LM nl, LAD 30p/m, LCX nl, RCA 10p/m, RPDA min irregs.  . Osteoarthritis   . Pneumonia   . TIA (transient ischemic attack)   . Tobacco abuse   . Vision loss    peripherial vision only left eye.[facility]  artery occusion     Past Surgical History:  Procedure Laterality Date  . CARDIAC CATHETERIZATION    . CARDIAC CATHETERIZATION N/A 01/28/2015   Procedure: Left Heart Cath and Coronary Angiography;  Surgeon: Wellington Hampshire, MD;  Location: [facility];  Service: Cardiovascular;  Laterality: N/A;  . COLONOSCOPY    . HIP ARTHROPLASTY Right 03/15/2013   Procedure: ARTHROPLASTY BIPOLAR HIP;  Surgeon: [name], MD;  Location: [facility];  Service: Orthopedics;  Laterality: Right;  . TOTAL SHOULDER ARTHROPLASTY Right 09/01/2015   Procedure:  RIGHT TOTAL SHOULDER ARTHROPLASTY;  Surgeon: [name], MD;  Location: [facility];  Service: Orthopedics;  Laterality: Right;    Family History  Problem Relation Age of Onset  . Diabetes Mother   . Hypertension Mother   . Diabetes Father   . Hypertension Father   . Diabetes Sister   . Dementia Brother   . Cancer Neg Hx   . Heart disease Neg Hx   . Stroke Neg Hx   . Colon cancer Neg Hx   . Esophageal cancer Neg Hx   . Rectal cancer Neg Hx   . Stomach cancer Neg Hx      Social History   Tobacco Use  . Smoking status: Current Every Day Smoker    Packs/day: 0.50    Years: 30.00    Pack years: 15.00    Types: Cigarettes  . Smokeless tobacco: Never Used  Substance Use Topics  . Alcohol use: Yes    Alcohol/week: 1.0 standard drink    Types: 1 Glasses of wine per week    Comment: occasionally  . Drug use: Yes    Types: Marijuana    Prior to Admission medications   Medication Sig Start Date End Date Taking? Authorizing Provider  albuterol (PROVENTIL HFA;VENTOLIN HFA) 108 (90 BASE) MCG/ACT inhaler Inhale 2 puffs into the lungs every 6 (six) hours as needed for wheezing or shortness of breath. Take 2 puffs every 5-10 minutes up to 6 puffs total over 15 minutes when needed.  Use with a spacer. 01/24/15  Yes Ahmed Prima, MD  allopurinol (ZYLOPRIM) 100 MG tablet Take 2 tablets (200 mg total) by mouth daily. 01/23/19  Yes [name], NP  aspirin EC 325 MG tablet Take 162.5 mg by mouth daily.   Yes [provider]  atorvastatin (LIPITOR) 20 MG tablet Take 1 tablet (20 mg total) by mouth daily. 06/19/17 11/07/20 Yes Wellington Hampshire, MD  carvedilol (COREG) 3.125 MG tablet Take 1 tablet (3.125 mg total) by mouth 2 (two) times daily with a meal. 11/09/19  Yes Adhikari, Tamsen Meek, MD  celecoxib (CELEBREX) 100 MG capsule Take 1 capsule (100 mg total) by mouth 2 (two) times daily. 09/09/19  Yes Baity, Coralie Keens, NP  cetirizine (ZYRTEC) 10 MG tablet TAKE 1 TABLET BY MOUTH  DAILY Patient taking differently: Take 10 mg by mouth daily.  07/22/19  Yes Baity, Coralie Keens, NP  cyclobenzaprine (FLEXERIL) 10 MG tablet Take 1 tablet (10 mg total) by mouth 3 (three) times daily as needed for muscle spasms. 07/30/19  Yes Baity, Coralie Keens, NP  ENTRESTO 97-103 MG TAKE 1 TABLET BY MOUTH TWICE (2) DAILY Patient taking differently: Take 1  tablet by mouth 2 (two) times daily.  05/27/19  Yes Hackney, Tina A, FNP  fenofibrate (TRICOR) 145 MG tablet TAKE 1 TABLET BY MOUTH ONCE A DAY Patient taking differently: Take 145 mg by mouth daily.  10/20/19  Yes Baity, Coralie Keens, NP  fluticasone (FLONASE) 50 MCG/ACT nasal spray Place 1 spray into both nostrils 2 (two) times daily. Patient taking differently: Place 1 spray into both nostrils daily as needed for allergies or rhinitis.  03/29/16  Yes Cuthriell, Charline Bills, PA-C  folic acid (FOLVITE) 1 MG tablet Take 1 tablet (1 mg total) by mouth daily. Patient taking differently: Take 0.5 mg by mouth daily.  09/20/16  Yes Hackney, Tina A, FNP  lidocaine (LIDODERM) 5 % Place 1 patch onto the skin daily. Remove & Discard patch within 12 hours or as directed by MD Patient taking differently: Place 1 patch onto the skin daily as needed (pain). Remove & Discard patch within 12 hours or as directed by MD 07/30/19  Yes [name], NP  Multiple Vitamin (MULTIVITAMIN WITH MINERALS) TABS tablet Take 1 tablet by mouth daily. Men's One a Day   Yes [provider]  Omega-3 Fatty Acids (FISH OIL PO) Take 1 capsule by mouth daily.   Yes [provider]  triamcinolone cream (KENALOG) 0.1 % APPLY TO AFFECTED AREAS TWICE A DAY AS NEEDED. AVOID FACE,GROIN, OR UNDERARMS Patient taking differently: Apply 1 application topically 2 (two) times daily as needed (eczema).  10/01/19  Yes Baity, Coralie Keens, NP  baclofen (LIORESAL) 10 MG tablet Take 1 tablet (10 mg total) by mouth 3 (three) times daily as needed for muscle spasms (sedation caution). Patient not taking:  Reported on 11/20/2019 06/25/19   Tonia Ghent, MD    Current Facility-Administered Medications  Medication Dose Route Frequency Provider Last Rate Last Admin  . 0.9 %  sodium chloride infusion (Manually program via Guardrails IV Fluids)   Intravenous Once Renold Genta, MD      . 0.9 %  sodium chloride infusion (Manually program via Guardrails IV Fluids)   Intravenous Once Rai, Ripudeep K, MD      . acetaminophen (TYLENOL) tablet 650 mg  650 mg Oral Q6H PRN Renold Genta, MD   650 mg at 11/24/19 0834  . albuterol (PROVENTIL) (2.5 MG/3ML) 0.083% nebulizer solution 2.5 mg  2.5 mg Inhalation Q6H PRN Tu, Ching T, DO      . allopurinol (ZYLOPRIM) tablet 200 mg  200 mg Oral Daily Tu, Ching T, DO   200 mg at 11/24/19 0830  . aspirin EC tablet 162 mg  162 mg Oral Daily Irene Pap N, DO   162 mg at 11/24/19 9470  . atorvastatin (LIPITOR) tablet 20 mg  20 mg Oral Daily Tu, Ching T, DO   20 mg at 11/24/19 0829  . celecoxib (CELEBREX) capsule 100 mg  100 mg Oral BID Tu, Ching T, DO   100 mg at 11/24/19 0830  . cyanocobalamin ((VITAMIN B-12)) injection 1,000 mcg  1,000 mcg Intramuscular Daily Rai, Ripudeep K, MD   1,000 mcg at 11/24/19 1010  . dextrose (D10W) 10% bolus 391 mL  5 mL/kg Intravenous Once Renold Genta, MD      . dextrose 50 % solution 50 mL  1 ampule Intravenous Once Noemi Chapel, MD      . fenofibrate tablet 160 mg  160 mg Oral Daily Tu, Ching T, DO   160 mg at 11/24/19 0829  . ferrous sulfate tablet 325 mg  325 mg Oral Q breakfast Kayleen Memos, DO   325 mg at 11/24/19 5974  . folic acid (FOLVITE) tablet 0.5 mg  0.5 mg Oral Daily Tu, Ching T, DO   0.5 mg at 11/24/19 0830  . magnesium sulfate IVPB 2 g 50 mL  2 g Intravenous Once Noemi Chapel, MD      . oxyCODONE (Oxy IR/ROXICODONE) immediate release tablet 5 mg  5 mg Oral Q6H PRN Irene Pap N, DO   5 mg at 11/24/19 0444  . polyvinyl alcohol (LIQUIFILM TEARS) 1.4 % ophthalmic solution 1 drop  1 drop Both Eyes TID Irene Pap N, DO   1 drop at 11/24/19 0831  . potassium chloride 10 mEq in 100 mL IVPB  10 mEq Intravenous Once Noemi Chapel, MD   Held at 11/20/19 1620  . senna-docusate (Senokot-S) tablet 2 tablet  2 tablet Oral BID Kayleen Memos, DO   2 tablet at 11/23/19 1638    Allergies as of 11/20/2019  . (No Known Allergies)     Review of Systems:    As per HPI, otherwise negative      Physical Exam:  Vital signs in last 24 hours: Temp:  [98.8 F (37.1 C)-99.7 F (37.6 C)] 99.7 F (37.6 C) (08/31 0825) Pulse Rate:  [82-95] 89 (08/31 0825) Resp:  [18-20] 18 (08/31 0825) BP: (96-116)/(64-84) 116/84 (08/31 0825) SpO2:  [98 %-100 %] 98 % (08/31 0825) Weight:  [79.1 kg] 79.1 kg (08/31 0423) Last BM Date: 11/23/19 General:   Pleasant male in NAD Head:  Normocephalic and atraumatic. Eyes:   No icterus.   Conjunctiva pale Ears:  Normal auditory acuity. Neck:  Supple Lungs:  Respirations even and unlabored.  Heart:  Regular rate and rhythm; Abdomen:  Soft, nondistended, nontender.  No appreciable masses   Rectal:  Not performed.  Msk:  Symmetrical without gross deformities.  Extremities:  Without edema. Neurologic:  Alert and  oriented x4;  grossly normal neurologically. Skin:  Intact without significant lesions or rashes. Psych:  Alert and cooperative. Normal affect.  LAB RESULTS: Recent Labs    11/22/19 0741 11/23/19 0249 11/24/19 0433  WBC 4.5 4.3 4.2  HGB 7.9* 7.9* 7.6*  HCT 23.5* 24.4* 23.0*  PLT 156 155 180   BMET Recent Labs    11/22/19 0013 11/23/19 0249 11/24/19 0433  NA 132* 134* 133*  K 4.3 4.4 4.2  CL 108 107 104  CO2 17* 20* 21*  GLUCOSE 96 113* 94  BUN 18 14 12   CREATININE 1.38* 1.29* 1.08  CALCIUM 8.0* 8.2* 8.6*   LFT Recent Labs    11/24/19 0433  PROT 5.0*  ALBUMIN 2.1*  AST 23  ALT 15  ALKPHOS 35*  BILITOT 0.5   PT/INR No results for input(s): LABPROT, INR in the last 72 hours.  STUDIES: No results found.  Lab Results  Component  Value Date   IRON 16 (L) 11/21/2019   TIBC 239 (L) 11/21/2019   FERRITIN 652 (H) 11/21/2019    Lab Results  Component Value Date   ALT 15 11/24/2019   AST 23 11/24/2019   ALKPHOS 35 (L) 11/24/2019   BILITOT 0.5 11/24/2019      Impression / Plan:   64 y/o male with medical history as outlined above, admitted with fevers, eye soreness / visual loss, noted to have a severe macrocytic anemia, mild weight loss, poor appetite. ID / optho following, thought to have acute viral illness which is resolving. His iron  studies are most c/w anemia of chronic disease. Further his MCV has been elevated > 100 and stool a few weeks ago negative for occult blood. He has not had any overt GI blood loss. It would seem unlikely he is having GI tract loss / GI bleeding causing this level of anemia without any overt bleeding, passing brown stools. Would send MMA and homocysteine levels in light of low normal B12 level to further evaluate that, otherwise chronic alcohol use could be causing / contributing to chronic macrocytosis.  That being said, he has had some weight loss, has been having poor PO intake, and has some occasional abdominal pain. PUD is on the ddx for these symptoms, while he does have gallstones symptoms seem atypical for biliary colic. His LFTs have been normal. CT scan did not show anything too concerning. I offered him an EGD to evaluate his upper tract symptoms and ensure nothing there to contribute to his anemia. I discussed EGD and anesthesia with him, risks / benefits of each, and he wanted to proceed. He has eaten today, so will keep NPO after MN and tentatively plan for EGD tomorrow. I would hold NSAIDs if possible (celebrex) and place empirically on PPI in the interim in light of his symptoms.   Call with questions in the interim. Dr. Fuller Plan to cover this patient's GI care tomorrow.  Leith Cellar, MD [facility]

## 2019-11-24 NOTE — Consult Note (Signed)
   [facility] Inpatient Consult   11/24/2019  Travis [name] 1955-11-13 [id number]   [place] [facility] [[facility]] Patient:  Medicare NextGen   Patient screened for unplanned readmission less than 30 days re-hospitalizations. Medical record reviewed  to check if potential [place] Management service needs.  Review of patient's medical record reveals patient is currently with a HX of HF, DM2 and AKI. Primary Care Provider is  [name], NP this provider is listed to provide the transition of care [TOC] for post hospital follow up.  Plan:  Continue to follow progress and disposition to assess for post hospital care management needs.  Notified inpatient [facility] RNCM regarding following progress and for needs.  Please place a [facility] Care Management consult as appropriate and for questions contact:   Natividad Brood, RN BSN [facility] Liaison  [phone] business mobile phone Toll free office [phone]  Fax number: [phone] [email or web address]

## 2019-11-24 NOTE — Progress Notes (Addendum)
Physical Therapy Treatment Patient Details Name: Travis [name] MRN: [id number] DOB: [birth year] Today's Date: 11/24/2019    History of Present Illness Patient is a 64 y/o male who presents with back pain, dizziness, eye pain. Admitted with hypotension, anemia and AKI. Workup pending. Recent admission 11/08/19 for similar symptoms. PMH includes HTN, HLD, DM2, cardiomyopathy, tobacco use, ETOH abuse, central retinal artery occlusion, heart failure.    PT Comments    Pt was seen for mobility on IV pole, monitored vitals and walked on room air with sat drop to 82%.  Recovered but was variable thereafter.  Reported to nursing to follow him with ck of sats with staff mobility bouts.  Follow acutely for needs of goals of therapy.   Follow Up Recommendations  No PT follow up     Equipment Recommendations  None recommended by PT    Recommendations for Other Services       Precautions / Restrictions Precautions Precautions: Fall Precaution Comments: monitor vitals Restrictions Weight Bearing Restrictions: No    Mobility  Bed Mobility Overal bed mobility: Modified Independent                Transfers Overall transfer level: Modified independent                  Ambulation/Gait Ambulation/Gait assistance: Supervision Gait Distance (Feet): 200 Feet Assistive device: IV Pole Gait Pattern/deviations: Step-through pattern;Decreased stride length Gait velocity: decreased Gait velocity interpretation: <1.31 ft/sec, indicative of household ambulator General Gait Details: O2 sats declined to 82% on room air   Stairs             Wheelchair Mobility    Modified Rankin (Stroke Patients Only)       Balance Overall balance assessment: Modified Independent                                          Cognition Arousal/Alertness: Awake/alert Behavior During Therapy: WFL for tasks assessed/performed Overall Cognitive Status: Within Functional Limits  for tasks assessed                                        Exercises      General Comments General comments (skin integrity, edema, etc.): pt is demonstrating fatigue and struggle with mobility while walking, requires help to maneuver safely but not a high fall risk      Pertinent Vitals/Pain Pain Assessment: Faces Faces Pain Scale: No hurt    Home Living                      Prior Function            PT Goals (current goals can now be found in the care plan section) Acute Rehab PT Goals Patient Stated Goal: o get home Progress towards PT goals: Progressing toward goals    Frequency    Min 3X/week      PT Plan Current plan remains appropriate    Co-evaluation              AM-PAC PT "6 Clicks" Mobility   Outcome Measure  Help needed turning from your back to your side while in a flat bed without using bedrails?: None Help needed moving from lying on your back to sitting on the side  of a flat bed without using bedrails?: None Help needed moving to and from a bed to a chair (including a wheelchair)?: None Help needed standing up from a chair using your arms (e.g., wheelchair or bedside chair)?: None Help needed to walk in hospital room?: None Help needed climbing 3-5 steps with a railing? : A Little 6 Click Score: 23    End of Session   Activity Tolerance: Treatment limited secondary to medical complications (Comment) Patient left: in bed;with call bell/phone within reach;with bed alarm set;with family/visitor present Nurse Communication: Mobility status;Other (comment) (O2 sat drop) PT Visit Diagnosis: Difficulty in walking, not elsewhere classified (R26.2);Other (comment)     Time: 1740-1810 PT Time Calculation (min) (ACUTE ONLY): 30 min  Charges:  $Gait Training: 8-22 mins $Therapeutic Activity: 8-22 mins                  Ramond Dial 11/24/2019, 11:51 PM  Mee Hives, PT MS Acute Rehab Dept. Number: [place] [facility] [place]

## 2019-11-24 NOTE — H&P (View-Only) (Signed)
Consultation  Referring Provider:     Irene Pap DO Primary Care Physician:  [name], NP Primary Gastroenterologist:        Havery Moros Reason for Consultation:     Anemia, weight loss         HPI:   Travis [name] is a 64 y.o. male with a history of CHF, CAD, central retinal vein occlusion, TIA, COPD, admitted to the hospital on 8/27 with eye soreness / visual loss, low back pain, fevers. During the course of his workup he was noted to have a severe anemia. Baseline Hgb 11-12s. Admitted in mid August for for TIA workup, noted to have Hgb of 8.9 at that time, macrocytic, with normal iron studies and low normal B12 level of 193. Stools negative for occult blood at that time. He was discharged with outpatient workup planned, but came back on 8/27 with eye soreness, fevers. He has had a CTA of chest / abdomen / pelvis on 8/29 which did not show any acute pathology. Diverticulosis noted without diverticulitis, as well as cholelithiasis. LFTs have been normal. Albumin levels low around 2.1. Ferritin level of 652 with iron level of 16, TIBC of 239, iron sat of 7. His Hgb on admission was 6.6 with an MCV of 109. He has received RBC transfusion. Hgb has been stable in the 7s since transfusion, today 7.6.  He denies any obvious blood in his stool. Passes brown stool normally, has had some loose stools recently.[name] He has had some intermittent epigastric to R sided pain at times, does not seem prandial and overall mild. Over the past 3 months he thinks he has lost about 11 lbs. He does endorse poor appetite, not eating too much, this preceeded his hospitalization. He does have 1/2 PPD tobacco use and at baseline drinks about 1/2 pint of liquor per week, has not had a drink in a few weeks however. No prior EGD. He had a colonoscopy with me in 08/27/2016 which showed diverticulosis, hemorrhoids, and no polyps, had a good prep at the time. Infectious disease has been following along in light of his fevers.  They suspect unspecific viral illness. Fever curve has been improving, he had a low grade temp this AM, resolved with tylenol. He is comfortable at this time, no shortness of breath or chest pains. Patient has also been seen by ophthalmology for his eye soreness / visual loss.   He has been taking celebrex as an outpatient and ? Aspirin, not on any PPIs.   Past Medical History:  Diagnosis Date  . Alcohol abuse   . 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 05/2019 Echo: EF 35-40%.  . Allergy   . Asthma   . 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 Chronic anemia   . Clotting disorder ([place])   . Diabetes mellitus, type 2 ([place])    pt reports his DM is gone  . GI bleed    15 years ago  . Gout   . Hemoptysis    secondary to pulmonary edema  . HFrEF (heart failure with reduced ejection fraction) ([place])    a. 12/2010 Echo: EF 20-25%; b 08/2013 Echo: EF 45-50%; c. 01/2015 Echo: EF 25-30%; d. 07/2016 Echo: EF 35-40%; e. 05/2019 Echo: EF 35-40%, glob HK. Mild LVH. Gr1 DD. Nl RV fxn.  . Hyperlipidemia   . Hypertension   . Nonischemic cardiomyopathy ([place])   . Nonobstructive Coronary artery disease    a. 01/2015  Cath: LM nl, LAD 30p/m, LCX nl, RCA 10p/m, RPDA min irregs.  . Osteoarthritis   . Pneumonia   . TIA (transient ischemic attack)   . Tobacco abuse   . Vision loss    peripherial vision only left eye.[facility]  artery occusion     Past Surgical History:  Procedure Laterality Date  . CARDIAC CATHETERIZATION    . CARDIAC CATHETERIZATION N/A 01/28/2015   Procedure: Left Heart Cath and Coronary Angiography;  Surgeon: Wellington Hampshire, MD;  Location: [facility];  Service: Cardiovascular;  Laterality: N/A;  . COLONOSCOPY    . HIP ARTHROPLASTY Right 03/15/2013   Procedure: ARTHROPLASTY BIPOLAR HIP;  Surgeon: [name], MD;  Location: [facility];  Service: Orthopedics;  Laterality: Right;  . TOTAL SHOULDER ARTHROPLASTY Right 09/01/2015   Procedure:  RIGHT TOTAL SHOULDER ARTHROPLASTY;  Surgeon: [name], MD;  Location: [facility];  Service: Orthopedics;  Laterality: Right;    Family History  Problem Relation Age of Onset  . Diabetes Mother   . Hypertension Mother   . Diabetes Father   . Hypertension Father   . Diabetes Sister   . Dementia Brother   . Cancer Neg Hx   . Heart disease Neg Hx   . Stroke Neg Hx   . Colon cancer Neg Hx   . Esophageal cancer Neg Hx   . Rectal cancer Neg Hx   . Stomach cancer Neg Hx      Social History   Tobacco Use  . Smoking status: Current Every Day Smoker    Packs/day: 0.50    Years: 30.00    Pack years: 15.00    Types: Cigarettes  . Smokeless tobacco: Never Used  Substance Use Topics  . Alcohol use: Yes    Alcohol/week: 1.0 standard drink    Types: 1 Glasses of wine per week    Comment: occasionally  . Drug use: Yes    Types: Marijuana    Prior to Admission medications   Medication Sig Start Date End Date Taking? Authorizing Provider  albuterol (PROVENTIL HFA;VENTOLIN HFA) 108 (90 BASE) MCG/ACT inhaler Inhale 2 puffs into the lungs every 6 (six) hours as needed for wheezing or shortness of breath. Take 2 puffs every 5-10 minutes up to 6 puffs total over 15 minutes when needed.  Use with a spacer. 01/24/15  Yes Ahmed Prima, MD  allopurinol (ZYLOPRIM) 100 MG tablet Take 2 tablets (200 mg total) by mouth daily. 01/23/19  Yes [name], NP  aspirin EC 325 MG tablet Take 162.5 mg by mouth daily.   Yes [provider]  atorvastatin (LIPITOR) 20 MG tablet Take 1 tablet (20 mg total) by mouth daily. 06/19/17 11/07/20 Yes Wellington Hampshire, MD  carvedilol (COREG) 3.125 MG tablet Take 1 tablet (3.125 mg total) by mouth 2 (two) times daily with a meal. 11/09/19  Yes Adhikari, Tamsen Meek, MD  celecoxib (CELEBREX) 100 MG capsule Take 1 capsule (100 mg total) by mouth 2 (two) times daily. 09/09/19  Yes Baity, Coralie Keens, NP  cetirizine (ZYRTEC) 10 MG tablet TAKE 1 TABLET BY MOUTH  DAILY Patient taking differently: Take 10 mg by mouth daily.  07/22/19  Yes Baity, Coralie Keens, NP  cyclobenzaprine (FLEXERIL) 10 MG tablet Take 1 tablet (10 mg total) by mouth 3 (three) times daily as needed for muscle spasms. 07/30/19  Yes Baity, Coralie Keens, NP  ENTRESTO 97-103 MG TAKE 1 TABLET BY MOUTH TWICE (2) DAILY Patient taking differently: Take 1  tablet by mouth 2 (two) times daily.  05/27/19  Yes Hackney, Tina A, FNP  fenofibrate (TRICOR) 145 MG tablet TAKE 1 TABLET BY MOUTH ONCE A DAY Patient taking differently: Take 145 mg by mouth daily.  10/20/19  Yes Baity, Coralie Keens, NP  fluticasone (FLONASE) 50 MCG/ACT nasal spray Place 1 spray into both nostrils 2 (two) times daily. Patient taking differently: Place 1 spray into both nostrils daily as needed for allergies or rhinitis.  03/29/16  Yes Cuthriell, Charline Bills, PA-C  folic acid (FOLVITE) 1 MG tablet Take 1 tablet (1 mg total) by mouth daily. Patient taking differently: Take 0.5 mg by mouth daily.  09/20/16  Yes Hackney, Tina A, FNP  lidocaine (LIDODERM) 5 % Place 1 patch onto the skin daily. Remove & Discard patch within 12 hours or as directed by MD Patient taking differently: Place 1 patch onto the skin daily as needed (pain). Remove & Discard patch within 12 hours or as directed by MD 07/30/19  Yes [name], NP  Multiple Vitamin (MULTIVITAMIN WITH MINERALS) TABS tablet Take 1 tablet by mouth daily. Men's One a Day   Yes [provider]  Omega-3 Fatty Acids (FISH OIL PO) Take 1 capsule by mouth daily.   Yes [provider]  triamcinolone cream (KENALOG) 0.1 % APPLY TO AFFECTED AREAS TWICE A DAY AS NEEDED. AVOID FACE,GROIN, OR UNDERARMS Patient taking differently: Apply 1 application topically 2 (two) times daily as needed (eczema).  10/01/19  Yes Baity, Coralie Keens, NP  baclofen (LIORESAL) 10 MG tablet Take 1 tablet (10 mg total) by mouth 3 (three) times daily as needed for muscle spasms (sedation caution). Patient not taking:  Reported on 11/20/2019 06/25/19   Tonia Ghent, MD    Current Facility-Administered Medications  Medication Dose Route Frequency Provider Last Rate Last Admin  . 0.9 %  sodium chloride infusion (Manually program via Guardrails IV Fluids)   Intravenous Once Renold Genta, MD      . 0.9 %  sodium chloride infusion (Manually program via Guardrails IV Fluids)   Intravenous Once Rai, Ripudeep K, MD      . acetaminophen (TYLENOL) tablet 650 mg  650 mg Oral Q6H PRN Renold Genta, MD   650 mg at 11/24/19 0834  . albuterol (PROVENTIL) (2.5 MG/3ML) 0.083% nebulizer solution 2.5 mg  2.5 mg Inhalation Q6H PRN Tu, Ching T, DO      . allopurinol (ZYLOPRIM) tablet 200 mg  200 mg Oral Daily Tu, Ching T, DO   200 mg at 11/24/19 0830  . aspirin EC tablet 162 mg  162 mg Oral Daily Irene Pap N, DO   162 mg at 11/24/19 0277  . atorvastatin (LIPITOR) tablet 20 mg  20 mg Oral Daily Tu, Ching T, DO   20 mg at 11/24/19 0829  . celecoxib (CELEBREX) capsule 100 mg  100 mg Oral BID Tu, Ching T, DO   100 mg at 11/24/19 0830  . cyanocobalamin ((VITAMIN B-12)) injection 1,000 mcg  1,000 mcg Intramuscular Daily Rai, Ripudeep K, MD   1,000 mcg at 11/24/19 1010  . dextrose (D10W) 10% bolus 391 mL  5 mL/kg Intravenous Once Renold Genta, MD      . dextrose 50 % solution 50 mL  1 ampule Intravenous Once Noemi Chapel, MD      . fenofibrate tablet 160 mg  160 mg Oral Daily Tu, Ching T, DO   160 mg at 11/24/19 0829  . ferrous sulfate tablet 325 mg  325 mg Oral Q breakfast Kayleen Memos, DO   325 mg at 11/24/19 5170  . folic acid (FOLVITE) tablet 0.5 mg  0.5 mg Oral Daily Tu, Ching T, DO   0.5 mg at 11/24/19 0830  . magnesium sulfate IVPB 2 g 50 mL  2 g Intravenous Once Noemi Chapel, MD      . oxyCODONE (Oxy IR/ROXICODONE) immediate release tablet 5 mg  5 mg Oral Q6H PRN Irene Pap N, DO   5 mg at 11/24/19 0444  . polyvinyl alcohol (LIQUIFILM TEARS) 1.4 % ophthalmic solution 1 drop  1 drop Both Eyes TID Irene Pap N, DO   1 drop at 11/24/19 0831  . potassium chloride 10 mEq in 100 mL IVPB  10 mEq Intravenous Once Noemi Chapel, MD   Held at 11/20/19 1620  . senna-docusate (Senokot-S) tablet 2 tablet  2 tablet Oral BID Kayleen Memos, DO   2 tablet at 11/23/19 0174    Allergies as of 11/20/2019  . (No Known Allergies)     Review of Systems:    As per HPI, otherwise negative      Physical Exam:  Vital signs in last 24 hours: Temp:  [98.8 F (37.1 C)-99.7 F (37.6 C)] 99.7 F (37.6 C) (08/31 0825) Pulse Rate:  [82-95] 89 (08/31 0825) Resp:  [18-20] 18 (08/31 0825) BP: (96-116)/(64-84) 116/84 (08/31 0825) SpO2:  [98 %-100 %] 98 % (08/31 0825) Weight:  [79.1 kg] 79.1 kg (08/31 0423) Last BM Date: 11/23/19 General:   Pleasant male in NAD Head:  Normocephalic and atraumatic. Eyes:   No icterus.   Conjunctiva pale Ears:  Normal auditory acuity. Neck:  Supple Lungs:  Respirations even and unlabored.  Heart:  Regular rate and rhythm; Abdomen:  Soft, nondistended, nontender.  No appreciable masses   Rectal:  Not performed.  Msk:  Symmetrical without gross deformities.  Extremities:  Without edema. Neurologic:  Alert and  oriented x4;  grossly normal neurologically. Skin:  Intact without significant lesions or rashes. Psych:  Alert and cooperative. Normal affect.  LAB RESULTS: Recent Labs    11/22/19 0741 11/23/19 0249 11/24/19 0433  WBC 4.5 4.3 4.2  HGB 7.9* 7.9* 7.6*  HCT 23.5* 24.4* 23.0*  PLT 156 155 180   BMET Recent Labs    11/22/19 0013 11/23/19 0249 11/24/19 0433  NA 132* 134* 133*  K 4.3 4.4 4.2  CL 108 107 104  CO2 17* 20* 21*  GLUCOSE 96 113* 94  BUN 18 14 12   CREATININE 1.38* 1.29* 1.08  CALCIUM 8.0* 8.2* 8.6*   LFT Recent Labs    11/24/19 0433  PROT 5.0*  ALBUMIN 2.1*  AST 23  ALT 15  ALKPHOS 35*  BILITOT 0.5   PT/INR No results for input(s): LABPROT, INR in the last 72 hours.  STUDIES: No results found.  Lab Results  Component  Value Date   IRON 16 (L) 11/21/2019   TIBC 239 (L) 11/21/2019   FERRITIN 652 (H) 11/21/2019    Lab Results  Component Value Date   ALT 15 11/24/2019   AST 23 11/24/2019   ALKPHOS 35 (L) 11/24/2019   BILITOT 0.5 11/24/2019      Impression / Plan:   64 y/o male with medical history as outlined above, admitted with fevers, eye soreness / visual loss, noted to have a severe macrocytic anemia, mild weight loss, poor appetite. ID / optho following, thought to have acute viral illness which is resolving. His iron  studies are most c/w anemia of chronic disease. Further his MCV has been elevated > 100 and stool a few weeks ago negative for occult blood. He has not had any overt GI blood loss. It would seem unlikely he is having GI tract loss / GI bleeding causing this level of anemia without any overt bleeding, passing brown stools. Would send MMA and homocysteine levels in light of low normal B12 level to further evaluate that, otherwise chronic alcohol use could be causing / contributing to chronic macrocytosis.  That being said, he has had some weight loss, has been having poor PO intake, and has some occasional abdominal pain. PUD is on the ddx for these symptoms, while he does have gallstones symptoms seem atypical for biliary colic. His LFTs have been normal. CT scan did not show anything too concerning. I offered him an EGD to evaluate his upper tract symptoms and ensure nothing there to contribute to his anemia. I discussed EGD and anesthesia with him, risks / benefits of each, and he wanted to proceed. He has eaten today, so will keep NPO after MN and tentatively plan for EGD tomorrow. I would hold NSAIDs if possible (celebrex) and place empirically on PPI in the interim in light of his symptoms.   Call with questions in the interim. Dr. Fuller Plan to cover this patient's GI care tomorrow.  Fredonia Cellar, MD [facility]

## 2019-11-25 ENCOUNTER — Other Ambulatory Visit: Payer: Self-pay

## 2019-11-25 ENCOUNTER — Inpatient Hospital Stay (HOSPITAL_COMMUNITY): Payer: Medicare Other | Admitting: Anesthesiology

## 2019-11-25 ENCOUNTER — Encounter (HOSPITAL_COMMUNITY): Payer: Self-pay | Admitting: Family Medicine

## 2019-11-25 ENCOUNTER — Encounter (HOSPITAL_COMMUNITY)
Admission: EM | Disposition: A | Discharge status: Home / Self Care | Payer: Self-pay | Source: Home / Self Care | Attending: Internal Medicine

## 2019-11-25 DIAGNOSIS — R630 Anorexia: Secondary | ICD-10-CM

## 2019-11-25 DIAGNOSIS — K2100 Gastro-esophageal reflux disease with esophagitis, without bleeding: Secondary | ICD-10-CM

## 2019-11-25 DIAGNOSIS — K2970 Gastritis, unspecified, without bleeding: Secondary | ICD-10-CM

## 2019-11-25 DIAGNOSIS — K269 Duodenal ulcer, unspecified as acute or chronic, without hemorrhage or perforation: Secondary | ICD-10-CM

## 2019-11-25 DIAGNOSIS — K259 Gastric ulcer, unspecified as acute or chronic, without hemorrhage or perforation: Secondary | ICD-10-CM

## 2019-11-25 DIAGNOSIS — I950 Idiopathic hypotension: Secondary | ICD-10-CM

## 2019-11-25 DIAGNOSIS — R1013 Epigastric pain: Secondary | ICD-10-CM

## 2019-11-25 HISTORY — PX: BIOPSY: SHX5522

## 2019-11-25 HISTORY — PX: ESOPHAGOGASTRODUODENOSCOPY (EGD) WITH PROPOFOL: SHX5813

## 2019-11-25 LAB — CULTURE, BLOOD (ROUTINE X 2)
Culture: NO GROWTH
Culture: NO GROWTH
Special Requests: ADEQUATE
Special Requests: ADEQUATE

## 2019-11-25 LAB — COMPREHENSIVE METABOLIC PANEL WITH GFR
ALT: 16 U/L (ref 0–44)
AST: 21 U/L (ref 15–41)
Albumin: 2.3 g/dL — ABNORMAL LOW (ref 3.5–5.0)
Alkaline Phosphatase: 37 U/L — ABNORMAL LOW (ref 38–126)
Anion gap: 9 (ref 5–15)
BUN: 7 mg/dL — ABNORMAL LOW (ref 8–23)
CO2: 23 mmol/L (ref 22–32)
Calcium: 8.8 mg/dL — ABNORMAL LOW (ref 8.9–10.3)
Chloride: 101 mmol/L (ref 98–111)
Creatinine, Ser: 1.06 mg/dL (ref 0.61–1.24)
GFR calc Af Amer: >60 mL/min
GFR calc non Af Amer: >60 mL/min
Glucose, Bld: 89 mg/dL (ref 70–99)
Potassium: 4.1 mmol/L (ref 3.5–5.1)
Sodium: 133 mmol/L — ABNORMAL LOW (ref 135–145)
Total Bilirubin: 0.9 mg/dL (ref 0.3–1.2)
Total Protein: 5.4 g/dL — ABNORMAL LOW (ref 6.5–8.1)

## 2019-11-25 LAB — TYPE AND SCREEN
ABO/RH(D): O POS
Antibody Screen: NEGATIVE
Unit division: 0

## 2019-11-25 LAB — BPAM RBC
Blood Product Expiration Date: 202109292359
ISSUE DATE / TIME: 202108311132
Unit Type and Rh: 5100

## 2019-11-25 LAB — CBC
HCT: 26.4 % — ABNORMAL LOW (ref 39.0–52.0)
Hemoglobin: 9.1 g/dL — ABNORMAL LOW (ref 13.0–17.0)
MCH: 33.1 pg (ref 26.0–34.0)
MCHC: 34.5 g/dL (ref 30.0–36.0)
MCV: 96 fL (ref 80.0–100.0)
Platelets: 228 K/uL (ref 150–400)
RBC: 2.75 MIL/uL — ABNORMAL LOW (ref 4.22–5.81)
RDW: 16 % — ABNORMAL HIGH (ref 11.5–15.5)
WBC: 4.8 K/uL (ref 4.0–10.5)
nRBC: 0 % (ref 0.0–0.2)

## 2019-11-25 LAB — HOMOCYSTEINE: Homocysteine: 16.9 umol/L (ref 0.0–17.2)

## 2019-11-25 SURGERY — ESOPHAGOGASTRODUODENOSCOPY (EGD) WITH PROPOFOL
Anesthesia: Monitor Anesthesia Care

## 2019-11-25 MED ORDER — PROPOFOL 10 MG/ML IV BOLUS
INTRAVENOUS | Status: DC | PRN
  Administered 2019-11-25: 15 mg via INTRAVENOUS
  Administered 2019-11-25: 10 mg via INTRAVENOUS
  Administered 2019-11-25 (×2): 15 mg via INTRAVENOUS

## 2019-11-25 MED ORDER — LIDOCAINE HCL (CARDIAC) PF 100 MG/5ML IV SOSY
PREFILLED_SYRINGE | INTRAVENOUS | Status: DC | PRN
  Administered 2019-11-25: 60 mg via INTRAVENOUS

## 2019-11-25 MED ORDER — PROPOFOL 500 MG/50ML IV EMUL
INTRAVENOUS | Status: DC | PRN
  Administered 2019-11-25: 15 mg via INTRAVENOUS
  Administered 2019-11-25: 75 ug/kg/min via INTRAVENOUS

## 2019-11-25 MED ORDER — LACTATED RINGERS IV SOLN: INTRAVENOUS | Status: DC | PRN

## 2019-11-25 MED ORDER — PANTOPRAZOLE SODIUM 40 MG PO TBEC
40.0000 mg | DELAYED_RELEASE_TABLET | Freq: Two times a day (BID) | ORAL | Status: DC
  Administered 2019-11-25 – 2019-11-26 (×3): 40 mg via ORAL
  Filled 2019-11-25 (×2): qty 1

## 2019-11-25 MED ORDER — DEXMEDETOMIDINE HCL 200 MCG/2ML IV SOLN
INTRAVENOUS | Status: DC | PRN
  Administered 2019-11-25 (×2): 4 ug via INTRAVENOUS

## 2019-11-25 SURGICAL SUPPLY — 15 items

## 2019-11-25 NOTE — Progress Notes (Addendum)
PROGRESS NOTE  Travis [name]:[id number] DOB: [birth year] DOA: 11/20/2019 PCP: [name], NP  HPI/Recap of past 24 hours: Weston Brass Pettyis a 64 y.o.malewith medical history significant fornonischemic cardiomyopathy, history of left central retinal occlusion, chronic systolic heart failure, COPD, TIA, type 2 diabetes, hypertension, hyperlipidemia who presents with concerns of bilateral eye soreness, blurry vision, dizziness and lower back pain. He washospitalized overnight on 11/08/2019 to 11/09/2019 and at that time he presented with dizziness, transient bilateral vision loss and scapular pain. Ophthalmology was consulted and recommended outpatient follow-up. He was admitted for TIA work-up and had negative CT and negative MRI. He was also notably found to be anemic with hemoglobin down to 9 from a prior of 12.9 in May. He had negative FOBT at that time. Also was found to be hypotensive initially and had his Coreg dose decreased. He is followed by cardiology outpatient and had his spironolactone discontinued. Patient states that he has been following this new medication regimen. In ED, Tmax 103, acute cardiac, tachypneic, hypotensive, systolic BP 81X over 91Y.  Worsening anemia, hemoglobin down to 6.6, transfused 1 unit packed RBCs CT abdomen pelvis, chest negative.  Patient was admitted for further work-up.  11/25/19:  Reports poor appetite and doesn't feel like eating.  Assessment/Plan: Principal Problem:   Hypotension Active Problems:   Diabetes (HCC)   HLD (hyperlipidemia)   Chronic systolic heart failure (HCC)   Tobacco use   Alcohol use   Fever of unknown origin   Anemia   CKD (chronic kidney disease), stage III   Loss of weight   Macrocytosis   Abdominal pain, epigastric   Decreased appetite  Hypotension, possibly iatrogenic -BP continues to be soft, 100/61 this a.m., no dizziness or lightheadedness. Continue to hold Entresto, Coreg  Bilateral eye pain with  blurry vision - dry eyes per ophthalmology -Seen by ophthalmology, Dr. Posey Pronto, recommended Liquifilm Tears 3 times daily -Outpatient follow-up with [facility] ophthalmology  Fever of unknown origin, most consistent with viral etiology -Afebrile, no leukocytosis, no new symptoms.  ID following -COVID-19 negative, respiratory virus panel negative, blood cultures negative so far -UA, CTA chest negative. -Patient was placed on empiric antibiotics, ID was consulted.  Seen by Dr. Linus Salmons -Antibiotics discontinued,  supportive care  Severe iron deficiency anemia, macrocytosis -FOBT negative on 8/15, iron studies suggestive of iron deficiency -Received 1 dose of IV Feraheme on 8/28, started on ferrous sulfate, received 1 unit packed RBCs on 8/27 -Hemoglobin down to 7.6, previous colonoscopy in 2018 had shown diverticulosis -Patient reported 11lbs weight loss in the last 2 months, loss of appetite, B12 borderline low 193, folate 14 -Consulted with GI, EGD done 11/25/19 revealed LA Grade A reflux esophagitis with plaques, non bleeding prepyloric gastric ulcer with no stigmata of bleeding, gastritis and duodenal erosions, biopsies taken, follow. -Placed on B12 replacement, continue folate Per GI, protonix 40 mg BID x 2 weeks then 40 mg daily long term. No ASA and no ibuprofen, naproxen or NSAIDs. F/u with Dr. Havery Moros in 6 weeks Consider EGD in 2-3 months to assess gastric ulcer healing  Newly diagnosed LA Grade A reflux esophagitis with plaques, non bleeding prepyloric gastric ulcer, gastritis and duodenal erosion Biopsies taken Follow pathology results Rest of management per above  Chronic systolic CHF -2D echo on 7/82 had shown EF of 45 to 50% -Currently stable, euvolemic  Diabetes mellitus type 2, controlled Hemoglobin A1c 7.3, CBGs stable  Tobacco use disorder -Counseled on smoking cessation  Alcohol use disorder -  Last use 2 weeks prior to presentation, currently no alcohol  withdrawals   Moderate protein calorie malnutrition  Albumin 2.1 Estimated body mass index is 21.23 kg/m as calculated from the following:   Height as of this encounter: 6\' 4"  (1.93 m).   Weight as of this encounter: 79.1 kg.  Code Status: Full CODE STATUS DVT Prophylaxis:  SCD's Family Communication: Discussed all imaging results, lab results, explained to the patient   Disposition Plan:     Status is: Inpatient  Remains inpatient appropriate because:Inpatient level of care appropriate due to severity of illness   Dispo:             Patient From: Home             Planned Disposition: Home             Expected discharge date: 11/26/19             Medically stable for discharge: No, poor oral intake.     Objective: Vitals:   11/25/19 0948 11/25/19 0957 11/25/19 1005 11/25/19 1310  BP: 128/65 (!) 144/70 (!) 145/73 123/71  Pulse: 77 71 74 93  Resp: (!) 26 (!) 26 (!) 22 15  Temp: 98.2 F (36.8 C)   99.5 F (37.5 C)  TempSrc: Oral   Oral  SpO2: 98% 99% 96% 98%  Weight:      Height:        Intake/Output Summary (Last 24 hours) at 11/25/2019 1533 Last data filed at 11/25/2019 1311 Gross per 24 hour  Intake 300 ml  Output 1276 ml  Net -976 ml   Filed Weights   11/23/19 0438 11/24/19 0423 11/25/19 0500  Weight: 84 kg 79.1 kg 81.7 kg    Exam:  . General: 64 y.o. year-old male well developed well nourished in no acute distress.  Alert and oriented x3. . Cardiovascular: Regular rate and rhythm with no rubs or gallops.  No thyromegaly or JVD noted.   [name] Respiratory: Clear to auscultation with no wheezes or rales. Good inspiratory effort. . Abdomen: Soft nontender nondistended with normal bowel sounds x4 quadrants. . Musculoskeletal: No lower extremity edema. 2/4 pulses in all 4 extremities. [name] Psychiatry: Mood is appropriate for condition and setting   Data Reviewed: CBC: Recent Labs  Lab 11/20/19 1600 11/20/19 1600 11/21/19 0554 11/22/19 0741  11/23/19 0249 11/24/19 0433 11/25/19 0659  WBC 5.6   < > 4.5 4.5 4.3 4.2 4.8  NEUTROABS 4.3  --   --   --   --   --   --   HGB 6.6*   < > 7.9* 7.9* 7.9* 7.6* 9.1*  HCT 21.4*   < > 24.6* 23.5* 24.4* 23.0* 26.4*  MCV 109.2*   < > 102.5* 100.4* 100.4* 98.7 96.0  PLT 186   < > 175 156 155 180 228   < > = values in this interval not displayed.   Basic Metabolic Panel: Recent Labs  Lab 11/21/19 0554 11/22/19 0013 11/23/19 0249 11/24/19 0433 11/25/19 0659  NA 135 132* 134* 133* 133*  K 4.6 4.3 4.4 4.2 4.1  CL 107 108 107 104 101  CO2 18* 17* 20* 21* 23  GLUCOSE 101* 96 113* 94 89  BUN 23 18 14 12  7*  CREATININE 1.72* 1.38* 1.29* 1.08 1.06  CALCIUM 8.2* 8.0* 8.2* 8.6* 8.8*   GFR: Estimated Creatinine Clearance: 82.4 mL/min (by C-G formula based on SCr of 1.06 mg/dL). Liver Function Tests: Recent Labs  Lab  11/21/19 0554 11/22/19 0013 11/23/19 0249 11/24/19 0433 11/25/19 0659  AST 20 22 23 23 21   ALT 16 15 17 15 16   ALKPHOS 26* 27* 31* 35* 37*  BILITOT 0.9 0.8 0.6 0.5 0.9  PROT 4.9* 4.6* 4.9* 5.0* 5.4*  ALBUMIN 2.2* 2.1* 2.1* 2.1* 2.3*   No results for input(s): LIPASE, AMYLASE in the last 168 hours. No results for input(s): AMMONIA in the last 168 hours. Coagulation Profile: Recent Labs  Lab 11/21/19 0054  INR 1.3*   Cardiac Enzymes: No results for input(s): CKTOTAL, CKMB, CKMBINDEX, TROPONINI in the last 168 hours. BNP (last 3 results) No results for input(s): PROBNP in the last 8760 hours. HbA1C: No results for input(s): HGBA1C in the last 72 hours. CBG: Recent Labs  Lab 11/20/19 1533  GLUCAP 87   Lipid Profile: No results for input(s): CHOL, HDL, LDLCALC, TRIG, CHOLHDL, LDLDIRECT in the last 72 hours. Thyroid Function Tests: No results for input(s): TSH, T4TOTAL, FREET4, T3FREE, THYROIDAB in the last 72 hours. Anemia Panel: No results for input(s): VITAMINB12, FOLATE, FERRITIN, TIBC, IRON, RETICCTPCT in the last 72 hours. Urine analysis:     Component Value Date/Time   COLORURINE YELLOW 11/20/2019 1600   APPEARANCEUR HAZY (A) 11/20/2019 1600   APPEARANCEUR Cloudy 11/19/2012 1107   LABSPEC 1.036 (H) 11/20/2019 1600   LABSPEC 1.024 11/19/2012 1107   PHURINE 5.0 11/20/2019 1600   GLUCOSEU NEGATIVE 11/20/2019 1600   GLUCOSEU Negative 11/19/2012 1107   HGBUR SMALL (A) 11/20/2019 1600   BILIRUBINUR NEGATIVE 11/20/2019 1600   BILIRUBINUR Negative 11/19/2012 1107   KETONESUR NEGATIVE 11/20/2019 1600   PROTEINUR NEGATIVE 11/20/2019 1600   UROBILINOGEN 0.2 09/14/2013 1225   NITRITE NEGATIVE 11/20/2019 1600   LEUKOCYTESUR TRACE (A) 11/20/2019 1600   LEUKOCYTESUR Negative 11/19/2012 1107   Sepsis Labs: @LABRCNTIP (procalcitonin:4,lacticidven:4)  ) Recent Results (from the past 240 hour(s))  Urine culture     Status: Abnormal   Collection Time: 11/20/19  2:58 PM   Specimen: In/Out Cath Urine  Result Value Ref Range Status   Specimen Description IN/OUT CATH URINE  Final   Special Requests   Final    NONE Performed at [facility], [place] [address], [place],  [address]    Culture MULTIPLE SPECIES PRESENT, SUGGEST RECOLLECTION (A)  Final   Report Status 11/21/2019 FINAL  Final  Blood Culture (routine x 2)     Status: None   Collection Time: 11/20/19  3:47 PM   Specimen: BLOOD  Result Value Ref Range Status   Specimen Description BLOOD LEFT ANTECUBITAL  Final   Special Requests   Final    BOTTLES DRAWN AEROBIC AND ANAEROBIC Blood Culture adequate volume   Culture   Final    NO GROWTH 5 DAYS Performed at [facility], [place] [address], [place],  [address]    Report Status 11/25/2019 FINAL  Final  SARS Coronavirus 2 by RT PCR (hospital order, performed in [facility] hospital lab) Nasopharyngeal Nasopharyngeal Swab     Status: None   Collection Time: 11/20/19  4:12 PM   Specimen: Nasopharyngeal Swab  Result Value Ref Range Status   SARS Coronavirus 2 NEGATIVE NEGATIVE Final    Comment:  (NOTE) SARS-CoV-2 target nucleic acids are NOT DETECTED.  The SARS-CoV-2 RNA is generally detectable in upper and lower respiratory specimens during the acute phase of infection. The lowest concentration of SARS-CoV-2 viral copies this assay can detect is 250 copies / mL. A negative result does not preclude  SARS-CoV-2 infection and should not be used as the sole basis for treatment or other patient management decisions.  A negative result may occur with improper specimen collection / handling, submission of specimen other than nasopharyngeal swab, presence of viral mutation(s) within the areas targeted by this assay, and inadequate number of viral copies (<250 copies / mL). A negative result must be combined with clinical observations, patient history, and epidemiological information.  Fact Sheet for Patients:   StrictlyIdeas.no  Fact Sheet for Healthcare Providers: BankingDealers.co.za  This test is not yet approved or  cleared by the [place] FDA and has been authorized for detection and/or diagnosis of SARS-CoV-2 by FDA under an Emergency Use Authorization (EUA).  This EUA will remain in effect (meaning this test can be used) for the duration of the COVID-19 declaration under Section 564(b)(1) of the Act, 21 U.S.C. section 360bbb-3(b)(1), unless the authorization is terminated or revoked sooner.  Performed at [facility], [place] [address] [place], [place] [address]   Blood Culture (routine x 2)     Status: None   Collection Time: 11/20/19 10:35 PM   Specimen: BLOOD LEFT HAND  Result Value Ref Range Status   Specimen Description BLOOD LEFT HAND  Final   Special Requests   Final    BOTTLES DRAWN AEROBIC AND ANAEROBIC Blood Culture adequate volume   Culture   Final    NO GROWTH 5 DAYS Performed at [facility], [place] [address], [place], [place] [address]    Report Status 11/25/2019 FINAL  Final  Respiratory  Panel by PCR     Status: None   Collection Time: 11/21/19  5:56 AM   Specimen: Nasopharyngeal Swab; Respiratory  Result Value Ref Range Status   Adenovirus NOT DETECTED NOT DETECTED Final   Coronavirus 229E NOT DETECTED NOT DETECTED Final    Comment: (NOTE) The Coronavirus on the Respiratory Panel, DOES NOT test for the novel  Coronavirus (2019 nCoV)    Coronavirus HKU1 NOT DETECTED NOT DETECTED Final   Coronavirus NL63 NOT DETECTED NOT DETECTED Final   Coronavirus OC43 NOT DETECTED NOT DETECTED Final   Metapneumovirus NOT DETECTED NOT DETECTED Final   Rhinovirus / Enterovirus NOT DETECTED NOT DETECTED Final   Influenza A NOT DETECTED NOT DETECTED Final   Influenza B NOT DETECTED NOT DETECTED Final   Parainfluenza Virus 1 NOT DETECTED NOT DETECTED Final   Parainfluenza Virus 2 NOT DETECTED NOT DETECTED Final   Parainfluenza Virus 3 NOT DETECTED NOT DETECTED Final   Parainfluenza Virus 4 NOT DETECTED NOT DETECTED Final   Respiratory Syncytial Virus NOT DETECTED NOT DETECTED Final   Bordetella pertussis NOT DETECTED NOT DETECTED Final   Chlamydophila pneumoniae NOT DETECTED NOT DETECTED Final   Mycoplasma pneumoniae NOT DETECTED NOT DETECTED Final    Comment: Performed at [facility], [place]. [address], [place], [place] [address]      Studies: No results found.  Scheduled Meds: . sodium chloride   Intravenous Once  . allopurinol  200 mg Oral Daily  . aspirin EC  162 mg Oral Daily  . atorvastatin  20 mg Oral Daily  . cyanocobalamin  1,000 mcg Intramuscular Daily  . dextrose  1 ampule Intravenous Once  . fenofibrate  160 mg Oral Daily  . ferrous sulfate  325 mg Oral Q breakfast  . folic acid  0.5 mg Oral Daily  . pantoprazole  40 mg Oral BID  . polyvinyl alcohol  1 drop Both Eyes TID  .  senna-docusate  2 tablet Oral BID    Continuous Infusions: . dextrose    . magnesium sulfate bolus IVPB    . potassium chloride Stopped (11/20/19 1620)     LOS: 5 days      Kayleen Memos, MD [facility] Pager [phone]  If 7PM-7AM, please contact night-coverage [email or web address] Password [facility] 11/25/2019, 3:33 PM

## 2019-11-25 NOTE — Op Note (Signed)
[facility] - Inpatient Patient Name: Travis [name] Procedure Date : 11/25/2019 MRN: [id number] Attending MD: Ladene Artist , MD Date of Birth: 1955-08-14 CSN: [id number] Age: 64 Admit Type: Inpatient Procedure:                Upper GI endoscopy Indications:              Epigastric abdominal pain, Anorexia, Weight loss Providers:                Pricilla Riffle. Fuller Plan, MD, Clyde Lundborg, RN, Cherylynn Ridges, Technician, Virgilio Belling. Huel Cote, CRNA Referring MD:             [facility] Medicines:                Monitored Anesthesia Care Complications:            No immediate complications. Estimated Blood Loss:     Estimated blood loss was minimal. Procedure:                Pre-Anesthesia Assessment:                           - Prior to the procedure, a History and Physical                            was performed, and patient medications and                            allergies were reviewed. The patient's tolerance of                            previous anesthesia was also reviewed. The risks                            and benefits of the procedure and the sedation                            options and risks were discussed with the patient.                            All questions were answered, and informed consent                            was obtained. Prior Anticoagulants: The patient has                            taken no previous anticoagulant or antiplatelet                            agents. ASA Grade Assessment: III - A patient with                            severe systemic disease. After reviewing the risks  and benefits, the patient was deemed in                            satisfactory condition to undergo the procedure.                           After obtaining informed consent, the endoscope was                            passed under direct vision. Throughout the                            procedure, the patient's blood pressure,  pulse, and                            oxygen saturations were monitored continuously. The                            GIF-H190 ([id number]) Olympus gastroscope was                            introduced through the mouth, and advanced to the                            second part of duodenum. The upper GI endoscopy was                            accomplished without difficulty. The patient                            tolerated the procedure well. Scope In: Scope Out: Findings:      LA Grade A (one or more mucosal breaks less than 5 mm, not extending       between tops of 2 mucosal folds) esophagitis with no bleeding was found       in the distal esophagus. Localized white plaques in distal esophagus.       Biopsies were taken with a cold forceps for histology.      The exam of the esophagus was otherwise normal.      One non-bleeding cratered gastric ulcer with no stigmata of bleeding was       found in the prepyloric region of the stomach. The lesion was 8 mm in       largest dimension.      A few localized small erosions with no bleeding and no stigmata of       recent bleeding were found in the prepyloric region of the stomach.       Biopsies were taken with a cold forceps for histology.      Patchy mild inflammation characterized by erythema was found in the       gastric body and in the gastric antrum. Biopsies were taken with a cold       forceps for histology.      A small hiatal hernia was present.      The exam of the stomach was otherwise normal.      A few localized erosions without bleeding were found in  the duodenal       bulb.      The exam of the duodenum was otherwise normal. Impression:               - LA Grade A reflux esophagitis, white plaques                            distal esophagus with no bleeding. Biopsied.                           - Non-bleeding prepyloric gastric ulcer with no                            stigmata of bleeding.                           - Erosive  gastropathy with no bleeding and no                            stigmata of recent bleeding. Biopsied.                           - Gastritis. Biopsied.                           - Small hiatal hernia.                           - Duodenal erosions without bleeding. Recommendation:           - Return patient to hospital ward for ongoing care.                           - Resume previous diet.                           - Follow antireflux measures.                           - Protonix (pantoprazole) 40 mg PO BID for 2 weeks                            then 40 mg po qd long term.                           - No aspirin, ibuprofen, naproxen, or other                            non-steroidal anti-inflammatory drugs.                           - Await pathology results.                           - Consider EGD in 2-3 months to assess gastric  ulcer healing per Dr. Havery Moros.                           - GI signing off. Return to GI office with Dr.                            Havery Moros in 6 weeks. Procedure Code(s):        --- Professional ---                           [phone], Esophagogastroduodenoscopy, flexible,                            transoral; with biopsy, single or multiple Diagnosis Code(s):        --- Professional ---                           K21.00, Gastro-esophageal reflux disease with                            esophagitis, without bleeding                           K25.9, Gastric ulcer, unspecified as acute or                            chronic, without hemorrhage or perforation                           K31.89, Other diseases of stomach and duodenum                           K29.70, Gastritis, unspecified, without bleeding                           K44.9, Diaphragmatic hernia without obstruction or                            gangrene                           K26.9, Duodenal ulcer, unspecified as acute or                            chronic, without  hemorrhage or perforation                           R10.13, Epigastric pain                           R63.0, Anorexia                           R63.4, Abnormal weight loss CPT copyright 2019 American Medical Association. All rights reserved. The codes documented in this report are preliminary and upon coder review may  be revised to meet current compliance requirements. Ladene Artist, MD 11/25/2019 10:00:00 AM This report  has been signed electronically. Number of Addenda: 0

## 2019-11-25 NOTE — Interval H&P Note (Signed)
History and Physical Interval Note:  11/25/2019 9:05 AM  Travis [name]  has presented today for surgery, with the diagnosis of anemia, weight loss, poor appetite.  The various methods of treatment have been discussed with the patient and family. After consideration of risks, benefits and other options for treatment, the patient has consented to  Procedure(s): ESOPHAGOGASTRODUODENOSCOPY (EGD) WITH PROPOFOL (N/A) as a surgical intervention.  The patient's history has been reviewed, patient examined, no change in status, stable for surgery.  I have reviewed the patient's chart and labs.  Questions were answered to the patient's satisfaction.     Pricilla Riffle. Fuller Plan

## 2019-11-25 NOTE — Transfer of Care (Signed)
Immediate Anesthesia Transfer of Care Note  Patient: Travis [name]  Procedure(s) Performed: ESOPHAGOGASTRODUODENOSCOPY (EGD) WITH PROPOFOL (N/A ) BIOPSY  Patient Location: PACU  Anesthesia Type:MAC  Level of Consciousness: awake, alert  and oriented  Airway & Oxygen Therapy: Patient Spontanous Breathing and Patient connected to nasal cannula oxygen  Post-op Assessment: Report given to RN, Post -op Vital signs reviewed and stable and Patient moving all extremities X 4  Post vital signs: Reviewed and stable  Last Vitals:  Vitals Value Taken Time  BP 128/65 11/25/19 0948  Temp    Pulse 79 11/25/19 0948  Resp 26 11/25/19 0948  SpO2 98 % 11/25/19 0948  Vitals shown include unvalidated device data.  Last Pain:  Vitals:   11/25/19 0827  TempSrc: Oral  PainSc: 7       Patients Stated Pain Goal: 0 (93/11/21 6244)  Complications: No complications documented.

## 2019-11-26 ENCOUNTER — Other Ambulatory Visit: Payer: Self-pay | Admitting: Physician Assistant

## 2019-11-26 ENCOUNTER — Other Ambulatory Visit: Payer: Self-pay | Admitting: *Deleted

## 2019-11-26 ENCOUNTER — Other Ambulatory Visit: Payer: Self-pay

## 2019-11-26 LAB — METHYLMALONIC ACID, SERUM: Methylmalonic Acid, Quantitative: 146 nmol/L (ref 0–378)

## 2019-11-26 MED ORDER — POLYETHYLENE GLYCOL 3350 17 G PO PACK: 17.0000 g | PACK | Freq: Every day | ORAL | 0 refills | Status: DC | PRN

## 2019-11-26 MED ORDER — PANTOPRAZOLE SODIUM 40 MG PO TBEC: 40.0000 mg | DELAYED_RELEASE_TABLET | Freq: Two times a day (BID) | ORAL | 0 refills | Status: DC

## 2019-11-26 MED ORDER — FERROUS SULFATE 325 (65 FE) MG PO TABS: 325.0000 mg | ORAL_TABLET | Freq: Every day | ORAL | 0 refills | Status: DC

## 2019-11-26 MED ORDER — POLYVINYL ALCOHOL 1.4 % OP SOLN: 1.0000 [drp] | Freq: Three times a day (TID) | OPHTHALMIC | 0 refills | Status: AC

## 2019-11-26 MED ORDER — PANTOPRAZOLE SODIUM 40 MG PO TBEC: 40.0000 mg | DELAYED_RELEASE_TABLET | Freq: Every day | ORAL | 0 refills | Status: DC

## 2019-11-26 NOTE — Progress Notes (Signed)
Physical Therapy Treatment Patient Details Name: Travis [name] MRN: [id number] DOB: 05-26-1955 Today's Date: 11/26/2019    History of Present Illness Patient is a 64 y/o male who presents with back pain, dizziness, eye pain. Admitted with hypotension, anemia and AKI. Workup pending. Recent admission 11/08/19 for similar symptoms. PMH includes HTN, HLD, DM2, cardiomyopathy, tobacco use, ETOH abuse, central retinal artery occlusion, heart failure.    PT Comments    Pt lying in bed with blankets covering his head. Pt reports small headache that is feeling better since he got some Tylenol. Pt agreeable to ambulation with therapy to make sure his SaO2 remains in the 90s with ambulation.  Pt is modI for bed mobility and transfers and supervision for ambulation of 400 feet without AD. Pt SaO2 on RA remained above 90%O2 throughout session. Pt is looking forward discharge home this afternoon.    Follow Up Recommendations  No PT follow up     Equipment Recommendations  None recommended by PT    Recommendations for Other Services       Precautions / Restrictions Precautions Precautions: Fall Precaution Comments: monitor vitals    Mobility  Bed Mobility Overal bed mobility: Modified Independent                Transfers Overall transfer level: Modified independent                  Ambulation/Gait Ambulation/Gait assistance: Supervision Gait Distance (Feet): 400 Feet Assistive device: None Gait Pattern/deviations: Step-through pattern;Decreased stride length Gait velocity: decreased Gait velocity interpretation: 1.31 - 2.62 ft/sec, indicative of limited community ambulator        Balance Overall balance assessment: Modified Independent                                          Cognition Arousal/Alertness: Awake/alert Behavior During Therapy: WFL for tasks assessed/performed Overall Cognitive Status: Within Functional Limits for tasks assessed                                            General Comments General comments (skin integrity, edema, etc.): SaO2 on RA >90%O2 thorughout ambulation      Pertinent Vitals/Pain Pain Assessment: Faces Faces Pain Scale: Hurts a little bit Pain Descriptors / Indicators: Headache Pain Intervention(s): Premedicated before session;Monitored during session           PT Goals (current goals can now be found in the care plan section) Acute Rehab PT Goals Patient Stated Goal: o get home PT Goal Formulation: With patient Time For Goal Achievement: 12/07/19 Potential to Achieve Goals: Good Progress towards PT goals: Progressing toward goals    Frequency    Min 3X/week      PT Plan Current plan remains appropriate       AM-PAC PT "6 Clicks" Mobility   Outcome Measure  Help needed turning from your back to your side while in a flat bed without using bedrails?: None Help needed moving from lying on your back to sitting on the side of a flat bed without using bedrails?: None Help needed moving to and from a bed to a chair (including a wheelchair)?: None Help needed standing up from a chair using your arms (e.g., wheelchair or bedside chair)?: None Help needed to  walk in hospital room?: None Help needed climbing 3-5 steps with a railing? : A Little 6 Click Score: 23    End of Session Equipment Utilized During Treatment: Gait belt Activity Tolerance: Patient tolerated treatment well Patient left: with call bell/phone within reach;in chair Nurse Communication: Mobility status PT Visit Diagnosis: Difficulty in walking, not elsewhere classified (R26.2)     Time: 0370-4888 PT Time Calculation (min) (ACUTE ONLY): 13 min  Charges:  $Therapeutic Exercise: 8-22 mins                     Einar Nolasco B. Migdalia Dk PT, DPT Acute Rehabilitation Services Pager [phone] Office [phone]    [place] 11/26/2019, 12:18 PM

## 2019-11-26 NOTE — Discharge Summary (Signed)
Discharge Summary  Travis [name] JJH:[id number] DOB: 16-Nov-1955  PCP: [name], NP  Admit date: 11/20/2019 Discharge date: 11/26/2019  Time spent: 35 minutes  Recommendations for Outpatient Follow-up:  1. Follow-up with GI 2. Follow-up with ophthalmology 3. Follow-up with your PCP 4. Take your medications as prescribed  GI recommendations: Per GI, continue protonix 40 mg BID x 2 weeks then 40 mg daily long term.  No ASA and no ibuprofen, naproxen or NSAIDs. F/u with Dr. Havery Moros in 6 weeks Consider EGD in 2-3 months to assess gastric ulcer healing  Discharge Diagnoses:  Active Hospital Problems   Diagnosis Date Noted  . Hypotension 11/20/2019  . Abdominal pain, epigastric   . Decreased appetite   . Loss of weight   . Macrocytosis   . Alcohol use 11/20/2019  . Fever of unknown origin 11/20/2019  . Anemia 11/20/2019  . CKD (chronic kidney disease), stage III 11/20/2019  . Tobacco use 01/05/2011  . Chronic systolic heart failure ([place]) 01/05/2011  . Diabetes ([place]) 11/30/2008  . HLD (hyperlipidemia) 11/30/2008    Resolved Hospital Problems  No resolved problems to display.    Discharge Condition: Stable  Diet recommendation: Resume previous diet  Vitals:   11/25/19 2318 11/26/19 0516  BP: 130/72 131/81  Pulse: 82 83  Resp: 19   Temp: 98.9 F (37.2 C) 99.3 F (37.4 C)  SpO2: 95% 97%    History of present illness:  Travis [name] a 64 y.o.malewith medical history significant fornonischemic cardiomyopathy, history of left central retinal occlusion, chronic systolic heart failure, COPD, TIA, type 2 diabetes, hypertension, hyperlipidemia who presents with concerns of bilateral eye soreness, blurry vision, dizziness and lower back pain. He washospitalized on 11/08/2019 to 11/09/2019 and at that time he presented with dizziness, transient bilateral vision loss and scapular pain. Ophthalmology was consulted and recommended outpatient follow-up. He was  admitted for TIA work-up and had negative CT and negative MRI. He was also found to be anemic with hemoglobin down to 9 from a prior of 12.9 in May. He had negative FOBT at that time. In the ED: Tmax 103,tachypneic, hypotensive, systolic BP 81E over 56D. Worsening anemia, hemoglobin down to 6.6, transfused 1 unit packed RBCs CT abdomen pelvis, chest negative. Patient was admitted by [facility] for further work-up.  Recurrent drop in Hg and report of weight loss prompted EGD done on 11/25/19 which revealed:  LA Grade A reflux esophagitis with plaques, non bleeding prepyloric gastric ulcer with no stigmata of bleeding, gastritis and duodenal erosions, biopsies taken.  GI recommendations:  11/26/19:  Seen and examined.  Vital signs reviewed and are stable.  Patient advised to follow up with GI and ophthalmology.  Receptive and agreeable to plan.   Hospital Course:  Principal Problem:   Hypotension Active Problems:   Diabetes (HCC)   HLD (hyperlipidemia)   Chronic systolic heart failure (H[facility])   Tobacco use   Alcohol use   Fever of unknown origin   Anemia   CKD (chronic kidney disease), stage III   Loss of weight   Macrocytosis   Abdominal pain, epigastric   Decreased appetite  Resolved hypotension,suspect iatrogenic BP currently normotensive 131/81 Resume low-dose Coreg 3.125 mg twice daily Continue to hold off Entresto to avoid hypotension Follow-up with your PCP and cardiology  Bilateral eye pain with blurry vision, improved -dry eyes per ophthalmology -Seen by ophthalmology, Dr. Posey Pronto, recommended Liquifilm Tears 3 times daily -Outpatient follow-up with [facility] ophthalmology  Fever of unknown origin, most  consistent with viral etiology -Afebrile, no leukocytosis, no new symptoms.  Seen by infectious disease, signed off. -COVID-19 negative, respiratory virus panel negative, blood cultures negative so far -UA, CTA chest negative. -Patient was placed on empiric  antibiotics, ID was consulted. Seen by Dr. Linus Salmons -Antibiotics discontinued, supportive care  Severe iron deficiency anemia, macrocytosis -FOBT negative on 8/15, iron studies suggestive of iron deficiency -Received 1 dose of IV Feraheme on 8/28, started on ferrous sulfate, received 1 unit packed RBCs on 11/20/19 -Hemoglobin down to 7.6 on 8/31, previous colonoscopy in 2018 had shown diverticulosis -Patient reported 11lbsweight loss in the last 2 months, loss of appetite, B12 borderline low 193, folate 14 -Consulted with GI EGD done 11/25/19 revealed LA Grade A reflux esophagitis with plaques, non bleeding prepyloric gastric ulcer with no stigmata of bleeding, gastritis and duodenal erosions, biopsies taken, follow. Per GI, protonix 40 mg BID x 2 weeks then 40 mg daily long term. No ASA and no ibuprofen, naproxen or NSAIDs. F/u with Dr. Havery Moros in 6 weeks Consider EGD in 2-3 months to assess gastric ulcer healing  Newly diagnosed LA Grade A reflux esophagitis with plaques, non bleeding prepyloric gastric ulcer, gastritis and duodenal erosion Biopsies taken Follow-up with GI outpatient for results of your biopsies.  Chronic systolic CHF -2D echo on 4/65 had shown EF of 45 to 50% -Currently stable, euvolemic -Continue Coreg Follow-up with cardiology  Diabetes mellitus type 2, controlled Hemoglobin A1c 7.3, CBGs stable Follow-up with your PCP  Tobacco use disorder -Counseled on smoking cessation  Alcohol use disorder -Last use 2 weeks prior to presentation, currently no alcohol withdrawals Cessation counseling at bedside  Moderate protein calorie malnutrition Albumin 2.1 Estimated body mass index is 21.23 kg/m as calculated from the following: Height as of this encounter: 6\' 4"  (1.93 m). Weight as of this encounter: 79.1 kg. Encouraged to increase oral protein calorie intake  Intermittent mild headache Tylenol as needed Follow-up with your PCP   Code  Status:Full CODE STATUS    Procedures:  EGD on 11/25/2019  Consultations:  GI  Ophthalmology  Discharge Exam: BP 131/81 (BP Location: Right Arm)   Pulse 83   Temp 99.3 F (37.4 C) (Oral)   Resp 19   Ht 6\' 4"  (1.93 m)   Wt 80.6 kg   SpO2 97%   BMI 21.63 kg/m  . General: 64 y.o. year-old male well developed well nourished in no acute distress.  Alert and oriented x3. . Cardiovascular: Regular rate and rhythm with no rubs or gallops.  No thyromegaly or JVD noted.   [name] Respiratory: Clear to auscultation with no wheezes or rales. Good inspiratory effort. . Abdomen: Soft nontender nondistended with normal bowel sounds x4 quadrants. . Musculoskeletal: No lower extremity edema. 2/4 pulses in all 4 extremities. [name] Psychiatry: Mood is appropriate for condition and setting  Discharge Instructions You were cared for by a hospitalist during your hospital stay. If you have any questions about your discharge medications or the care you received while you were in the hospital after you are discharged, you can call the unit and asked to speak with the hospitalist on call if the hospitalist that took care of you is not available. Once you are discharged, your primary care physician will handle any further medical issues. Please note that NO REFILLS for any discharge medications will be authorized once you are discharged, as it is imperative that you return to your primary care physician (or establish a relationship with a primary care physician  if you do not have one) for your aftercare needs so that they can reassess your need for medications and monitor your lab values.   Allergies as of 11/26/2019   No Known Allergies     Medication List    STOP taking these medications   baclofen 10 MG tablet Commonly known as: LIORESAL   celecoxib 100 MG capsule Commonly known as: CeleBREX   Entresto 97-103 MG Generic drug: sacubitril-valsartan     TAKE these medications   albuterol 108 (90 Base)  MCG/ACT inhaler Commonly known as: VENTOLIN HFA Inhale 2 puffs into the lungs every 6 (six) hours as needed for wheezing or shortness of breath. Take 2 puffs every 5-10 minutes up to 6 puffs total over 15 minutes when needed.  Use with a spacer. Notes to patient: As needed for shortness of breath or wheezing    allopurinol 100 MG tablet Commonly known as: ZYLOPRIM Take 2 tablets (200 mg total) by mouth daily. Notes to patient: Gout    aspirin EC 325 MG tablet Take 162.5 mg by mouth daily. Notes to patient: Blood thinner Prevents clotting    atorvastatin 20 MG tablet Commonly known as: LIPITOR Take 1 tablet (20 mg total) by mouth daily. Notes to patient: Lowers cholesterol and triglycerides   carvedilol 3.125 MG tablet Commonly known as: COREG Take 1 tablet (3.125 mg total) by mouth 2 (two) times daily with a meal. Notes to patient: Decreases work of the heart  Lowers blood pressure and heart rate    cetirizine 10 MG tablet Commonly known as: ZYRTEC TAKE 1 TABLET BY MOUTH DAILY Notes to patient: Allergies    cyclobenzaprine 10 MG tablet Commonly known as: FLEXERIL Take 1 tablet (10 mg total) by mouth 3 (three) times daily as needed for muscle spasms. Notes to patient: As needed for muscle spasms    fenofibrate 145 MG tablet Commonly known as: TRICOR TAKE 1 TABLET BY MOUTH ONCE A DAY Notes to patient: Lowers cholesterol and triglycerides    ferrous sulfate 325 (65 FE) MG tablet Take 1 tablet (325 mg total) by mouth daily with breakfast. Start taking on: November 27, 2019 Notes to patient: Iron supplement    FISH OIL PO Take 1 capsule by mouth daily. Notes to patient: Supplement    fluticasone 50 MCG/ACT nasal spray Commonly known as: Flonase Place 1 spray into both nostrils 2 (two) times daily. What changed:   when to take this  reasons to take this Notes to patient: Prevents nasal congestion    folic acid 1 MG tablet Commonly known as: FOLVITE Take 1  tablet (1 mg total) by mouth daily. What changed: how much to take Notes to patient: Supplement    lidocaine 5 % Commonly known as: Lidoderm Place 1 patch onto the skin daily. Remove & Discard patch within 12 hours or as directed by MD What changed:   when to take this  reasons to take this Notes to patient: Pain control    multivitamin with minerals Tabs tablet Take 1 tablet by mouth daily. Men's One a Day Notes to patient: Supplement    pantoprazole 40 MG tablet Commonly known as: PROTONIX Take 1 tablet (40 mg total) by mouth 2 (two) times daily for 14 days. Notes to patient: Decreases acid production to stomach TAKE 2 X DAY INCLUDING 9/16   pantoprazole 40 MG tablet Commonly known as: Protonix Take 1 tablet (40 mg total) by mouth daily. Start taking on: December 11, 2019 Notes to patient:  Decreases acid production to the stomach TAKE ONCE A DAY START 9/17   polyethylene glycol 17 g packet Commonly known as: MiraLax Take 17 g by mouth daily as needed for moderate constipation.   polyvinyl alcohol 1.4 % ophthalmic solution Commonly known as: LIQUIFILM TEARS Place 1 drop into both eyes 3 (three) times daily. Notes to patient: Dry eyes as needed    triamcinolone cream 0.1 % Commonly known as: KENALOG APPLY TO AFFECTED AREAS TWICE A DAY AS NEEDED. AVOID FACE,GROIN, OR UNDERARMS What changed: See the new instructions.      No Known Allergies  Follow-up Information    [name], NP. Call in 1 day(s).   Specialties: Internal Medicine, Emergency Medicine Why: please call for a post hospital follow up appointment. Contact information: [place] [address] [phone]        Wellington Hampshire, MD .   Specialty: Cardiology Contact information: [place] [address] [phone]        Yetta Flock, MD. Call in 1 day(s).   Specialty: Gastroenterology Why: please call for a post hospital  follow up appointment. Contact information: [place] [address] [phone]        Jalene Mullet, MD. Call in 1 day(s).   Specialty: Ophthalmology Why: please call for a post hospital follow up appointment. Contact information: [address] [place] [address] [phone]                The results of significant diagnostics from this hospitalization (including imaging, microbiology, ancillary and laboratory) are listed below for reference.    Significant Diagnostic Studies: CT Head Wo Contrast  Result Date: 11/08/2019 CLINICAL DATA:  Transient ischemic attack (TIA) Patient reports acute onset of nausea, vomiting and vision changes. EXAM: CT HEAD WITHOUT CONTRAST TECHNIQUE: Contiguous axial images were obtained from the base of the skull through the vertex without intravenous contrast. COMPARISON:  Head CT 07/26/2019 FINDINGS: Brain: Stable degree of atrophy and chronic small vessel ischemia. No intracranial hemorrhage, mass effect, or midline shift. No hydrocephalus. The basilar cisterns are patent. No evidence of territorial infarct or acute ischemia. No extra-axial or intracranial fluid collection. Vascular: No hyperdense vessel. Skull: No fracture or focal lesion. Sinuses/Orbits: Paranasal sinuses and mastoid air cells are clear. The visualized orbits are unremarkable. Other: None. IMPRESSION: 1. No acute intracranial abnormality. 2. Stable atrophy and chronic small vessel ischemia. Electronically Signed   By: Keith Rake M.D.   On: 11/08/2019 19:24   MR BRAIN W WO CONTRAST  Result Date: 11/09/2019 CLINICAL DATA:  Episode of binocular vision loss EXAM: MRI HEAD WITHOUT AND WITH CONTRAST TECHNIQUE: Multiplanar, multiecho pulse sequences of the brain and surrounding structures were obtained without and with intravenous contrast. CONTRAST:  34mL GADAVIST GADOBUTROL 1 MMOL/ML IV SOLN COMPARISON:  2015 FINDINGS: Brain: There is no acute  infarction or intracranial hemorrhage. There is no intracranial mass, mass effect, or edema. There is no hydrocephalus or extra-axial fluid collection. Prominence of the ventricles and sulci reflects mild generalized parenchymal volume loss, with some progression since 2015. Patchy T2 hyperintensity in the supratentorial white matter is nonspecific but probably reflects mild chronic microvascular ischemic changes similar to prior study. No abnormal enhancement. Vascular: Major vessel flow voids at the skull base are preserved. Skull and upper cervical spine: Normal marrow signal is preserved. Sinuses/Orbits: Minor mucosal thickening.  Orbits are unremarkable. Other: Sella is unremarkable. Trace  right mastoid fluid opacification. IMPRESSION: No evidence of recent infarction, hemorrhage, mass, or abnormal enhancement. Stable chronic microvascular ischemic changes. Electronically Signed   By: Macy Mis M.D.   On: 11/09/2019 10:17   DG Chest Port 1 View  Result Date: 11/20/2019 CLINICAL DATA:  Questionable sepsis. EXAM: PORTABLE CHEST 1 VIEW COMPARISON:  11/08/2019 FINDINGS: Heart and mediastinal contours are within normal limits. No focal opacities or effusions. No acute bony abnormality. IMPRESSION: No active disease. Electronically Signed   By: Rolm Baptise M.D.   On: 11/20/2019 15:53   DG Chest Port 1 View  Result Date: 11/08/2019 CLINICAL DATA:  Vomiting. EXAM: PORTABLE CHEST 1 VIEW COMPARISON:  Jul 26, 2019 FINDINGS: There is no evidence of acute infiltrate, pleural effusion or pneumothorax. The heart size and mediastinal contours are within normal limits. An intact right shoulder replacement is seen. The visualized skeletal structures are otherwise unremarkable. IMPRESSION: No active disease. Electronically Signed   By: Virgina Norfolk M.D.   On: 11/08/2019 16:13   ECHOCARDIOGRAM COMPLETE  Result Date: 11/09/2019    ECHOCARDIOGRAM REPORT   Patient Name:   DAX MURGUIA Sonneborn Date of Exam: 11/09/2019  Medical Rec #:  [id number]     Height:       76.0 in Accession #:    [id number]    Weight:       175.0 lb Date of Birth:  03/05/1956    BSA:          2.093 m Patient Age:    86 years      BP:           115/75 mmHg Patient Gender: M             HR:           74 bpm. Exam Location:  Inpatient Procedure: 2D Echo Indications:    TIA 435.9  History:        Patient has prior history of Echocardiogram examinations, most                 recent 06/04/2019. Cardiomyopathy, CAD, TIA; Risk                 Factors:Diabetes, Hypertension, Dyslipidemia and Current Smoker.                 ETOH Abuse. Alcoholic Cardiomyopathy. tobacco abuse.  Sonographer:    Jannett Celestine RDCS (AE) Referring Phys: [place]  1. Compared to echo done in March 2021 EF improved and thickening of MV and subchordal apparatus more prominent.  2. Left ventricular ejection fraction, by estimation, is 45 to 50%. The left ventricle has mildly decreased function. The left ventricle demonstrates regional wall motion abnormalities (see scoring diagram/findings for description). There is mild left ventricular hypertrophy. Left ventricular diastolic parameters are indeterminate.  3. Enlarged basal annulus . Right ventricular systolic function is mildly reduced. The right ventricular size is mildly enlarged.  4. Right atrial size was mildly dilated.  5. MV is thickened cannot r/o SBE but likely reducndant sub chordal apparatus consider TEE to further evaluate if suspicion of SBE high . The mitral valve is abnormal. Trivial mitral valve regurgitation. No evidence of mitral stenosis.  6. The aortic valve was not well visualized. Aortic valve regurgitation is not visualized. Mild to moderate aortic valve sclerosis/calcification is present, without any evidence of aortic stenosis.  7. The inferior vena cava is normal in size with greater than 50% respiratory variability, suggesting right atrial  pressure of 3 mmHg. FINDINGS  Left Ventricle: Left  ventricular ejection fraction, by estimation, is 45 to 50%. The left ventricle has mildly decreased function. The left ventricle demonstrates regional wall motion abnormalities. The left ventricular internal cavity size was normal in size. There is mild left ventricular hypertrophy. Left ventricular diastolic parameters are indeterminate. Right Ventricle: Enlarged basal annulus. The right ventricular size is mildly enlarged. Right vetricular wall thickness was not assessed. Right ventricular systolic function is mildly reduced. Left Atrium: Left atrial size was normal in size. Right Atrium: Right atrial size was mildly dilated. Pericardium: There is no evidence of pericardial effusion. Mitral Valve: MV is thickened cannot r/o SBE but likely reducndant sub chordal apparatus consider TEE to further evaluate if suspicion of SBE high. The mitral valve is abnormal. There is moderate thickening of the mitral valve leaflet(s). There is mild calcification of the mitral valve leaflet(s). Normal mobility of the mitral valve leaflets. Trivial mitral valve regurgitation. No evidence of mitral valve stenosis. Tricuspid Valve: The tricuspid valve is normal in structure. Tricuspid valve regurgitation is mild . No evidence of tricuspid stenosis. Aortic Valve: The aortic valve was not well visualized. Aortic valve regurgitation is not visualized. Mild to moderate aortic valve sclerosis/calcification is present, without any evidence of aortic stenosis. Pulmonic Valve: The pulmonic valve was normal in structure. Pulmonic valve regurgitation is not visualized. No evidence of pulmonic stenosis. Aorta: The aortic root is normal in size and structure. Venous: The inferior vena cava is normal in size with greater than 50% respiratory variability, suggesting right atrial pressure of 3 mmHg. IAS/Shunts: No atrial level shunt detected by color flow Doppler. Additional Comments: Compared to echo done in March 2021 EF improved and thickening of  MV and subchordal apparatus more prominent.  LEFT VENTRICLE PLAX 2D LVIDd:         4.30 cm  Diastology LVIDs:         2.90 cm  LV e' lateral:   11.40 cm/s LV PW:         1.20 cm  LV E/e' lateral: 5.5 LV IVS:        1.20 cm  LV e' medial:    5.66 cm/s LVOT diam:     2.20 cm  LV E/e' medial:  11.1 LV SV:         57 LV SV Index:   27 LVOT Area:     3.80 cm  RIGHT VENTRICLE RV S prime:     12.50 cm/s TAPSE (M-mode): 1.9 cm LEFT ATRIUM           Index       RIGHT ATRIUM           Index LA diam:      2.70 cm 1.29 cm/m  RA Area:     17.10 cm LA Vol (A2C): 36.1 ml 17.25 ml/m RA Volume:   44.00 ml  21.02 ml/m  AORTIC VALVE LVOT Vmax:   81.60 cm/s LVOT Vmean:  63.500 cm/s LVOT VTI:    0.151 m  AORTA Ao Root diam: 3.00 cm MITRAL VALVE MV Area (PHT): 3.83 cm    SHUNTS MV Decel Time: 198 msec    Systemic VTI:  0.15 m MV E velocity: 63.00 cm/s  Systemic Diam: 2.20 cm MV A velocity: 67.70 cm/s MV E/A ratio:  0.93 Jenkins Rouge MD Electronically signed by Jenkins Rouge MD Signature Date/Time: 11/09/2019/12:18:29 PM    Final    CT Angio Chest/Abd/Pel for Dissection W and/or Wo  Contrast  Result Date: 11/20/2019 CLINICAL DATA:  Abdominal pain question aortic dissection EXAM: CT ANGIOGRAPHY CHEST, ABDOMEN AND PELVIS TECHNIQUE: Non-contrast CT of the chest was initially obtained. Multidetector CT imaging through the chest, abdomen and pelvis was performed using the standard protocol during bolus administration of intravenous contrast. Multiplanar reconstructed images and MIPs were obtained and reviewed to evaluate the vascular anatomy. CONTRAST:  151mL OMNIPAQUE IOHEXOL 350 MG/ML SOLN IV COMPARISON:  CT chest abdomen pelvis 07/26/2019 FINDINGS: CTA CHEST FINDINGS Cardiovascular: Scattered atherosclerotic calcifications aorta, proximal great vessels, and coronary arteries. Aorta normal caliber. No intramural hematoma on precontrast imaging. Normal aortic enhancement following contrast without evidence of dissection. Heart  unremarkable. No pericardial effusion. Pulmonary arteries grossly patent on non targeted exam. Mediastinum/Nodes: Base of cervical region normal appearance. Esophagus unremarkable. Multiple normal and upper normal sized mediastinal lymph nodes. 11 mm RIGHT paratracheal node image 59, unchanged. 12 mm RIGHT paratracheal node image 52, previously 10 mm. No hilar or axillary adenopathy. BILATERAL gynecomastia. Lungs/Pleura: Linear subsegmental atelectasis BILATERAL lower lobes. Lungs otherwise clear. No infiltrate, pleural effusion or pneumothorax. Musculoskeletal: RIGHT shoulder prosthesis. Degenerative changes LEFT glenohumeral joint. Degenerative disc disease changes lower cervical spine. Review of the MIP images confirms the above findings. CTA ABDOMEN AND PELVIS FINDINGS VASCULAR Aorta: Scattered atherosclerotic calcifications abdominal aorta. Minimal eccentric noncalcified plaque. Normal caliber without aneurysm or dissection. Celiac: Widely patent SMA: Widely patent Renals: Patent BILATERAL renal arteries. Accessory renal artery to upper pole LEFT kidney. IMA: Patent Inflow: Scattered atherosclerotic calcifications in the iliac arteries and common femoral arteries without high-grade stenosis Veins: Predominately unopacified at time of CTA imaging Review of the MIP images confirms the above findings. NON-VASCULAR Hepatobiliary: Contracted gallbladder with single tiny gallstone. Liver unremarkable. Pancreas: Normal appearance Spleen: Normal appearance Adrenals/Urinary Tract: Adrenal glands normal appearance. Cortical thinning in the kidneys. Tiny LEFT renal cyst. Kidneys and ureters otherwise unremarkable. Bladder decompressed. Stomach/Bowel: Scattered colonic diverticula greatest at ascending colon. No evidence of diverticulitis. Normal appendix. Stomach and bowel loops otherwise normal appearance. Lymphatic: No adenopathy Reproductive: Unremarkable prostate gland and seminal vesicles Other: No free air or free  fluid. Small umbilical hernia containing fat. No inguinal hernias. No inflammatory process. Musculoskeletal: RIGHT hip prosthesis. Soft tissue calcifications adjacent to proximal RIGHT femur. Bones demineralized with degenerative disc disease changes of the lumbar spine. Review of the MIP images confirms the above findings. IMPRESSION: Atherosclerotic calcifications of aorta, proximal great vessels, and coronary arteries. No evidence of aortic aneurysm or dissection. Linear subsegmental atelectasis BILATERAL lower lobes. Scattered colonic diverticulosis without evidence of diverticulitis. Small umbilical hernia containing fat. Cholelithiasis. Nonspecific minimally enlarged mediastinal lymph nodes little changed from 07/26/2019. Aortic Atherosclerosis (ICD10-I70.0). Electronically Signed   By: Lavonia Dana M.D.   On: 11/20/2019 16:02   VAS US CAROTID (at [facility] and [facility] only)  Result Date: 11/09/2019 Carotid Arterial Duplex Study Indications:       TIA. Comparison Study:  No prior studies. Performing Technologist: Oliver Hum RVT  Examination Guidelines: A complete evaluation includes B-mode imaging, spectral Doppler, color Doppler, and power Doppler as needed of all accessible portions of each vessel. Bilateral testing is considered an integral part of a complete examination. Limited examinations for reoccurring indications may be performed as noted.  Right Carotid Findings: +----------+--------+--------+--------+-----------------------+--------+           PSV cm/sEDV cm/sStenosisPlaque Description     Comments +----------+--------+--------+--------+-----------------------+--------+ CCA Prox  78      18  smooth and heterogenoustortuous +----------+--------+--------+--------+-----------------------+--------+ CCA Distal46      18              smooth and heterogenous         +----------+--------+--------+--------+-----------------------+--------+ ICA Prox  44      10               smooth and heterogenous         +----------+--------+--------+--------+-----------------------+--------+ ICA Mid                                                  tortuous +----------+--------+--------+--------+-----------------------+--------+ ICA Distal92      40                                     tortuous +----------+--------+--------+--------+-----------------------+--------+ ECA       47      10                                              +----------+--------+--------+--------+-----------------------+--------+ +----------+--------+-------+--------+-------------------+           PSV cm/sEDV cmsDescribeArm Pressure (mmHG) +----------+--------+-------+--------+-------------------+ [id number]                                         +----------+--------+-------+--------+-------------------+ +---------+--------+--+--------+--+---------+ VertebralPSV cm/s26EDV cm/s10Antegrade +---------+--------+--+--------+--+---------+  Left Carotid Findings: +----------+--------+--------+--------+-----------------------+--------+           PSV cm/sEDV cm/sStenosisPlaque Description     Comments +----------+--------+--------+--------+-----------------------+--------+ CCA Prox  91      24              smooth and heterogenous         +----------+--------+--------+--------+-----------------------+--------+ CCA Distal64      14              smooth and heterogenous         +----------+--------+--------+--------+-----------------------+--------+ ICA Prox  61      17              smooth and heterogenoustortuous +----------+--------+--------+--------+-----------------------+--------+ ICA Distal73      31                                     tortuous +----------+--------+--------+--------+-----------------------+--------+ ECA       56      12                                               +----------+--------+--------+--------+-----------------------+--------+ +----------+--------+--------+--------+-------------------+           PSV cm/sEDV cm/sDescribeArm Pressure (mmHG) +----------+--------+--------+--------+-------------------+ [id number]                                          +----------+--------+--------+--------+-------------------+ +---------+--------+--+--------+-+---------+ VertebralPSV cm/s36EDV cm/s8Antegrade +---------+--------+--+--------+-+---------+   Summary: Right Carotid: Velocities in the right ICA are consistent  with a 1-39% stenosis. Left Carotid: Velocities in the left ICA are consistent with a 1-39% stenosis. Vertebrals: Bilateral vertebral arteries demonstrate antegrade flow. *See table(s) above for measurements and observations.  Electronically signed by Antony Contras MD on 11/09/2019 at 3:09:41 PM.    Final     Microbiology: Recent Results (from the past 240 hour(s))  Urine culture     Status: Abnormal   Collection Time: 11/20/19  2:58 PM   Specimen: In/Out Cath Urine  Result Value Ref Range Status   Specimen Description IN/OUT CATH URINE  Final   Special Requests   Final    NONE Performed at [facility], [address], [place], [place] [address]    Culture MULTIPLE SPECIES PRESENT, SUGGEST RECOLLECTION (A)  Final   Report Status 11/21/2019 FINAL  Final  Blood Culture (routine x 2)     Status: None   Collection Time: 11/20/19  3:47 PM   Specimen: BLOOD  Result Value Ref Range Status   Specimen Description BLOOD LEFT ANTECUBITAL  Final   Special Requests   Final    BOTTLES DRAWN AEROBIC AND ANAEROBIC Blood Culture adequate volume   Culture   Final    NO GROWTH 5 DAYS Performed at [facility], [place] [address], [place], [place] [address]    Report Status 11/25/2019 FINAL  Final  SARS Coronavirus 2 by RT PCR (hospital order, performed in [facility] hospital lab) Nasopharyngeal Nasopharyngeal Swab     Status: None    Collection Time: 11/20/19  4:12 PM   Specimen: Nasopharyngeal Swab  Result Value Ref Range Status   SARS Coronavirus 2 NEGATIVE NEGATIVE Final    Comment: (NOTE) SARS-CoV-2 target nucleic acids are NOT DETECTED.  The SARS-CoV-2 RNA is generally detectable in upper and lower respiratory specimens during the acute phase of infection. The lowest concentration of SARS-CoV-2 viral copies this assay can detect is 250 copies / mL. A negative result does not preclude SARS-CoV-2 infection and should not be used as the sole basis for treatment or other patient management decisions.  A negative result may occur with improper specimen collection / handling, submission of specimen other than nasopharyngeal swab, presence of viral mutation(s) within the areas targeted by this assay, and inadequate number of viral copies (<250 copies / mL). A negative result must be combined with clinical observations, patient history, and epidemiological information.  Fact Sheet for Patients:   StrictlyIdeas.no  Fact Sheet for Healthcare Providers: BankingDealers.co.za  This test is not yet approved or  cleared by the [place] FDA and has been authorized for detection and/or diagnosis of SARS-CoV-2 by FDA under an Emergency Use Authorization (EUA).  This EUA will remain in effect (meaning this test can be used) for the duration of the COVID-19 declaration under Section 564(b)(1) of the Act, 21 U.S.C. section 360bbb-3(b)(1), unless the authorization is terminated or revoked sooner.  Performed at [facility], [place] [address], [place], [place] [address]   Blood Culture (routine x 2)     Status: None   Collection Time: 11/20/19 10:35 PM   Specimen: BLOOD LEFT HAND  Result Value Ref Range Status   Specimen Description BLOOD LEFT HAND  Final   Special Requests   Final    BOTTLES DRAWN AEROBIC AND ANAEROBIC Blood Culture adequate volume   Culture   Final      NO GROWTH 5 DAYS Performed at [facility], [place] [address], [place], [place] [address]  Report Status 11/25/2019 FINAL  Final  Respiratory Panel by PCR     Status: None   Collection Time: 11/21/19  5:56 AM   Specimen: Nasopharyngeal Swab; Respiratory  Result Value Ref Range Status   Adenovirus NOT DETECTED NOT DETECTED Final   Coronavirus 229E NOT DETECTED NOT DETECTED Final    Comment: (NOTE) The Coronavirus on the Respiratory Panel, DOES NOT test for the novel  Coronavirus (2019 nCoV)    Coronavirus HKU1 NOT DETECTED NOT DETECTED Final   Coronavirus NL63 NOT DETECTED NOT DETECTED Final   Coronavirus OC43 NOT DETECTED NOT DETECTED Final   Metapneumovirus NOT DETECTED NOT DETECTED Final   Rhinovirus / Enterovirus NOT DETECTED NOT DETECTED Final   Influenza A NOT DETECTED NOT DETECTED Final   Influenza B NOT DETECTED NOT DETECTED Final   Parainfluenza Virus 1 NOT DETECTED NOT DETECTED Final   Parainfluenza Virus 2 NOT DETECTED NOT DETECTED Final   Parainfluenza Virus 3 NOT DETECTED NOT DETECTED Final   Parainfluenza Virus 4 NOT DETECTED NOT DETECTED Final   Respiratory Syncytial Virus NOT DETECTED NOT DETECTED Final   Bordetella pertussis NOT DETECTED NOT DETECTED Final   Chlamydophila pneumoniae NOT DETECTED NOT DETECTED Final   Mycoplasma pneumoniae NOT DETECTED NOT DETECTED Final    Comment: Performed at [facility], [address], [place], [place] [address]     Labs: Basic Metabolic Panel: Recent Labs  Lab 11/21/19 0554 11/22/19 0013 11/23/19 0249 11/24/19 0433 11/25/19 0659  NA 135 132* 134* 133* 133*  K 4.6 4.3 4.4 4.2 4.1  CL 107 108 107 104 101  CO2 18* 17* 20* 21* 23  GLUCOSE 101* 96 113* 94 89  BUN 23 18 14 12  7*  CREATININE 1.72* 1.38* 1.29* 1.08 1.06  CALCIUM 8.2* 8.0* 8.2* 8.6* 8.8*   Liver Function Tests: Recent Labs  Lab 11/21/19 0554 11/22/19 0013 11/23/19 0249 11/24/19 0433 11/25/19 0659  AST 20 22 23 23 21   ALT 16 15 17  15 16   ALKPHOS 26* 27* 31* 35* 37*  BILITOT 0.9 0.8 0.6 0.5 0.9  PROT 4.9* 4.6* 4.9* 5.0* 5.4*  ALBUMIN 2.2* 2.1* 2.1* 2.1* 2.3*   No results for input(s): LIPASE, AMYLASE in the last 168 hours. No results for input(s): AMMONIA in the last 168 hours. CBC: Recent Labs  Lab 11/20/19 1600 11/20/19 1600 11/21/19 0554 11/22/19 0741 11/23/19 0249 11/24/19 0433 11/25/19 0659  WBC 5.6   < > 4.5 4.5 4.3 4.2 4.8  NEUTROABS 4.3  --   --   --   --   --   --   HGB 6.6*   < > 7.9* 7.9* 7.9* 7.6* 9.1*  HCT 21.4*   < > 24.6* 23.5* 24.4* 23.0* 26.4*  MCV 109.2*   < > 102.5* 100.4* 100.4* 98.7 96.0  PLT 186   < > 175 156 155 180 228   < > = values in this interval not displayed.   Cardiac Enzymes: No results for input(s): CKTOTAL, CKMB, CKMBINDEX, TROPONINI in the last 168 hours. BNP: BNP (last 3 results) Recent Labs    11/20/19 1600  BNP 365.0*    ProBNP (last 3 results) No results for input(s): PROBNP in the last 8760 hours.  CBG: Recent Labs  Lab 11/20/19 1533  GLUCAP 87       Signed:  Kayleen Memos, MD [facility] 11/26/2019, 6:41 PM

## 2019-11-26 NOTE — Anesthesia Preprocedure Evaluation (Signed)
Anesthesia Evaluation  Patient identified by MRN, date of birth, ID band Patient awake    Reviewed: Allergy & Precautions, NPO status , Patient's Chart, lab work & pertinent test results  Airway Mallampati: II  TM Distance: >3 FB     Dental   Pulmonary asthma , pneumonia, COPD, Current Smoker and Patient abstained from smoking.,    breath sounds clear to auscultation       Cardiovascular hypertension, + CAD   Rhythm:Regular Rate:Normal     Neuro/Psych PSYCHIATRIC DISORDERS TIA   GI/Hepatic negative GI ROS, Neg liver ROS,   Endo/Other  diabetes  Renal/GU Renal disease     Musculoskeletal  (+) Arthritis ,   Abdominal   Peds  Hematology  (+) anemia ,   Anesthesia Other Findings   Reproductive/Obstetrics                             Anesthesia Physical Anesthesia Plan  ASA: III  Anesthesia Plan: MAC   Post-op Pain Management:    Induction: Intravenous  PONV Risk Score and Plan: 1 and Propofol infusion  Airway Management Planned: Nasal Cannula and Simple Face Mask  Additional Equipment:   Intra-op Plan:   Post-operative Plan:   Informed Consent: I have reviewed the patients History and Physical, chart, labs and discussed the procedure including the risks, benefits and alternatives for the proposed anesthesia with the patient or authorized representative who has indicated his/her understanding and acceptance.     Dental advisory given  Plan Discussed with: CRNA and Anesthesiologist  Anesthesia Plan Comments:         Anesthesia Quick Evaluation

## 2019-11-26 NOTE — Patient Outreach (Signed)
Travis [name]) Care Management  11/26/2019  Travis [name] Dec 07, 1955 [id number]   Subjective: No patient outreach at this time patient remains inpatient.    Objective: Per KPN (Knowledge Performance Now, point of care tool) and chart review, patient hospitalized 11/20/2019, remains inpatient for hypotension.   Patient also has a history of the following: COPD, diabetes, chronic systolic congestive heart failure,  Tobacco use, alcohol use, chronic kidney disease stage 3, nonischemic cardiomyopathy, left central retinal occlusion, hypertension, hyperlipidemia, and TIA.   Patient had  A1C of  7.3 on 11/09/2019.      Assessment: Received Med Pay / [facility] Liaison referral on 11/26/2019.   Referral source: Travis [name].   Referral reason:  Please assign to [place] Coordinator for complex care and disease       management [Heart Failure, GIB, DM] follow up calls and assess for       further needs. His niece is also his contact person. PCP does the TOC       at [facility].  Screening  follow up pending patient / patient's niece/ designated party release contact.      Plan: RNCM will call patient and/ or / patient's niece/ designated party release  for  telephone outreach attempt within 2- 3 business days of notification of hospital discharge, screening follow up, and will proceed with case closure, after 4th unsuccessful outreach call.     Travis [name] H. Annia Friendly, BSN, [place] Management [facility] Telephonic CM Phone: [phone] Fax: [phone]

## 2019-11-26 NOTE — Care Management Important Message (Signed)
Important Message  Patient Details  Name: Travis [name] MRN: [id number] Date of Birth: 1955/12/08   Medicare Important Message Given:  Yes     Shelda Altes 11/26/2019, 11:44 AM

## 2019-11-26 NOTE — Anesthesia Postprocedure Evaluation (Signed)
Anesthesia Post Note  Patient: DUVID SMALLS  Procedure(s) Performed: ESOPHAGOGASTRODUODENOSCOPY (EGD) WITH PROPOFOL (N/A ) BIOPSY     Patient location during evaluation: Endoscopy Level of consciousness: awake Pain management: pain level controlled Vital Signs Assessment: post-procedure vital signs reviewed and stable Respiratory status: spontaneous breathing Cardiovascular status: stable Postop Assessment: no apparent nausea or vomiting Anesthetic complications: no   No complications documented.  Last Vitals:  Vitals:   11/25/19 2318 11/26/19 0516  BP: 130/72 131/81  Pulse: 82 83  Resp: 19   Temp: 37.2 C 37.4 C  SpO2: 95% 97%    Last Pain:  Vitals:   11/26/19 0623  TempSrc:   PainSc: Asleep                 Billee Balcerzak

## 2019-11-27 ENCOUNTER — Other Ambulatory Visit: Payer: Self-pay | Admitting: *Deleted

## 2019-11-27 ENCOUNTER — Telehealth: Payer: Self-pay

## 2019-11-27 ENCOUNTER — Encounter: Payer: Self-pay | Admitting: Gastroenterology

## 2019-11-27 ENCOUNTER — Telehealth: Payer: Self-pay | Admitting: Internal Medicine

## 2019-11-27 LAB — SURGICAL PATHOLOGY

## 2019-11-27 NOTE — Telephone Encounter (Signed)
Patient called.  Patient was discharged from [facility] yesterday. Patient had a fever and they didn't know what was wrong.  Patient said they changed several medications.  Patient was told to follow up with [facility] as soon as possible.  Rollene Fare is full this afternoon and off next week.  Please advise.

## 2019-11-27 NOTE — Telephone Encounter (Signed)
Patient said he could wait until Rollene Fare comes back.  Patient scheduled appointment on 12/07/19.

## 2019-11-27 NOTE — Patient Outreach (Addendum)
[facility]) Care Management  11/27/2019  Travis [name] Dec 11, 1955 [id number]   Subjective: Telephone call to patient's home / mobile number, spoke with patient, and HIPAA verified.  Discussed [facility] Care Management Medicare Screening follow up, patient voiced understanding, and is in agreement to brief follow up.    Patient states doing pretty good, he may be interested in the program, would like to discuss further at a later time, and requested call back .   States he is currently waiting for a visit from his sister who will be visiting from out of town.   Patient states he is appreciative of the follow up and looks forward to discussing program with RNCM.       Objective: Per KPN (Knowledge Performance Now, point of care tool) and chart review, patient hospitalized 11/20/2019, remains inpatient for hypotension.   Patient also has a history of the following: COPD, diabetes, chronic systolic congestive heart failure,  Tobacco use, alcohol use, chronic kidney disease stage 3, nonischemic cardiomyopathy, left central retinal occlusion, hypertension, hyperlipidemia, and TIA.   Patient had  A1C of  7.3 on 11/09/2019.      Assessment: Received Med Pay / [facility] Liaison referral on 11/26/2019.   Referral source: Natividad Brood.   Referral reason:  Please assign to [place] Coordinator for complex care and disease       management [Heart Failure, GIB, DM] follow up calls and assess for       further needs. His niece is also his contact person. PCP does the TOC       at [facility].  Screening  follow up pending patient / patient's niece/ designated party release contact.      Plan: RNCM will send unsuccessful outreach  letter, [facility] pamphlet,  will call patient and/ or / patient's niece/ designated party release  for  2nd telephone outreach attempt within 4 business days, screening follow up, and will proceed with case  closure, after 4th unsuccessful outreach call.     Klein Willcox H. Annia Friendly, BSN, [place] Management [facility] Telephonic CM Phone: [phone] Fax: [phone]

## 2019-11-27 NOTE — Telephone Encounter (Signed)
done

## 2019-11-27 NOTE — Telephone Encounter (Signed)
Yes, if he feels he can not wait until I come back.

## 2019-11-27 NOTE — Telephone Encounter (Signed)
Governor Specking said your schedule is full and I should put patient in with another provider.  Patient said several of his medications were changed.  Is it okay for patient to see another provider?

## 2019-11-27 NOTE — Telephone Encounter (Signed)
Transition Care Management Follow-up Telephone Call  Date of discharge and from where: [facility]  How have you been since you were released from the hospital? Patient is doing better but he still has a headache and he wants something prescribed for this.  Any questions or concerns? Yes see above  Items Reviewed:  Did the pt receive and understand the discharge instructions provided? Yes   Medications obtained and verified? Yes   Any new allergies since your discharge? No   Dietary orders reviewed? Yes  Do you have support at home? Yes   Functional Questionnaire: (I = Independent and D = Dependent) ADLs: I  Bathing/Dressing- I  Meal Prep- I  Eating- I  Maintaining continence- I  Transferring/Ambulation- I  Managing Meds- I  Follow up appointments reviewed:   PCP Hospital f/u appt confirmed? Yes  Scheduled to see [name], NP on 12/07/2019 @ 10 am.  [facility] f/u appt confirmed? Yes  Scheduled to see cardiology   Are transportation arrangements needed? No   If their condition worsens, is the pt aware to call PCP or go to the Emergency Dept.? Yes  Was the patient provided with contact information for the PCP's office or ED? Yes  Was to pt encouraged to call back with questions or concerns? Yes

## 2019-11-27 NOTE — Telephone Encounter (Signed)
1st attempt- Left message on voicemail to return my call- need to complete TCM and schedule follow up visit.

## 2019-11-29 ENCOUNTER — Encounter (HOSPITAL_COMMUNITY): Payer: Self-pay | Admitting: Gastroenterology

## 2019-12-01 ENCOUNTER — Telehealth: Payer: Self-pay | Admitting: Family

## 2019-12-01 MED ORDER — SPIRONOLACTONE 25 MG PO TABS: 12.5000 mg | ORAL_TABLET | Freq: Every day | ORAL | 3 refills | Status: DC

## 2019-12-01 NOTE — Telephone Encounter (Signed)
Patient called to say that ever since he got discharged, he's slowly picking up weight and now his ankles are getting swollen. 2 days ago, he weighed 179 pounds, yesterday he weighed 174 pounds and today he weighed 177 pounds.   Doesn't feel much shortness of breath. Has appointment with PCP on 12/07/19. Reports numerous medication changes with recent hospitalization with hypotension and anemia.   Advised patient to resume his spironolactone 25mg  as 1/2 tablet daily. Will not resume entresto at this time as he doesn't have BP cuff at home. Advised patient to call me back in 2 days to update on weight and swelling with verbal understanding from the patient.

## 2019-12-04 ENCOUNTER — Other Ambulatory Visit: Payer: Self-pay | Admitting: *Deleted

## 2019-12-04 NOTE — Patient Outreach (Signed)
[name]) Care Management  12/04/2019  Travis [name] 08/19/1955 [id number]   Subjective: Telephone call to patient's home / mobile number, spoke with patient, and HIPAA verified. Discussed [facility] Care Management Medicare Screening follow up, patient voiced understanding, and is in agreement to follow up at a later time.     Patient states doing a whole lot better, currently has company, not a good time to talk, he may be interested in the program, would like to discuss further at a later time, and requested call back .       Objective:Per KPN (Knowledge Performance Now, point of care tool) and chart review,patient hospitalized 11/20/2019, remains inpatient for hypotension. Patient also has a history of the following: COPD, diabetes, chronic systolic congestive heart failure, Tobacco use, alcohol use, chronic kidney disease stage 3, nonischemic cardiomyopathy,left central retinal occlusion, hypertension,hyperlipidemia, andTIA. Patient had A1C of 7.3 on 11/09/2019.     Assessment: Received Med Pay / [facility] Liaison referral on 11/26/2019. Referral source: Natividad Brood. Referral reason:  Please assign to [place] Coordinator for complex care and disease       management [Heart Failure, GIB, DM] follow up calls and assess for       further needs. His niece is also his contact person. PCP does the TOC       at [facility]  Screening follow up pending patient / patient's niece/ designated party releasecontact.     Plan:RNCM has sent unsuccessful outreach  letter, [facility] pamphlet,  will call patientand/ or / patient's niece/ designated party releasefor 3rd telephone outreach attempt within 4business days, screening follow up, and will proceed with case closure, after 4th unsuccessful outreach call.     Tyrianna Lightle H. Annia Friendly, BSN, [place] Management [facility] Telephonic  CM Phone: [phone] Fax: [phone]

## 2019-12-07 ENCOUNTER — Other Ambulatory Visit: Payer: Self-pay

## 2019-12-07 ENCOUNTER — Encounter: Payer: Self-pay | Admitting: Internal Medicine

## 2019-12-07 ENCOUNTER — Ambulatory Visit (INDEPENDENT_AMBULATORY_CARE_PROVIDER_SITE_OTHER): Payer: Medicare Other | Admitting: Internal Medicine

## 2019-12-07 VITALS — BP 120/70 | HR 88 | Temp 97.9°F | Wt 177.0 lb

## 2019-12-07 DIAGNOSIS — Z23 Encounter for immunization: Secondary | ICD-10-CM | POA: Diagnosis not present

## 2019-12-07 DIAGNOSIS — K253 Acute gastric ulcer without hemorrhage or perforation: Secondary | ICD-10-CM

## 2019-12-07 DIAGNOSIS — R634 Abnormal weight loss: Secondary | ICD-10-CM

## 2019-12-07 DIAGNOSIS — D500 Iron deficiency anemia secondary to blood loss (chronic): Secondary | ICD-10-CM | POA: Diagnosis not present

## 2019-12-07 DIAGNOSIS — H04123 Dry eye syndrome of bilateral lacrimal glands: Secondary | ICD-10-CM | POA: Diagnosis not present

## 2019-12-07 LAB — CBC WITH DIFFERENTIAL/PLATELET
Basophils Absolute: 0.1 10*3/uL (ref 0.0–0.1)
Basophils Relative: 1 % (ref 0.0–3.0)
Eosinophils Absolute: 0.7 10*3/uL (ref 0.0–0.7)
Eosinophils Relative: 11.9 % — ABNORMAL HIGH (ref 0.0–5.0)
HCT: 33.7 % — ABNORMAL LOW (ref 39.0–52.0)
Hemoglobin: 11.1 g/dL — ABNORMAL LOW (ref 13.0–17.0)
Lymphocytes Relative: 19.6 % (ref 12.0–46.0)
Lymphs Abs: 1.1 10*3/uL (ref 0.7–4.0)
MCHC: 32.9 g/dL (ref 30.0–36.0)
MCV: 101.4 fl — ABNORMAL HIGH (ref 78.0–100.0)
Monocytes Absolute: 0.6 10*3/uL (ref 0.1–1.0)
Monocytes Relative: 10.5 % (ref 3.0–12.0)
Neutro Abs: 3.3 10*3/uL (ref 1.4–7.7)
Neutrophils Relative %: 57 % (ref 43.0–77.0)
Platelets: 322 10*3/uL (ref 150.0–400.0)
RBC: 3.32 Mil/uL — ABNORMAL LOW (ref 4.22–5.81)
RDW: 18.1 % — ABNORMAL HIGH (ref 11.5–15.5)
WBC: 5.8 10*3/uL (ref 4.0–10.5)

## 2019-12-07 LAB — COMPREHENSIVE METABOLIC PANEL
ALT: 11 U/L (ref 0–53)
AST: 17 U/L (ref 0–37)
Albumin: 4.1 g/dL (ref 3.5–5.2)
Alkaline Phosphatase: 47 U/L (ref 39–117)
BUN: 16 mg/dL (ref 6–23)
CO2: 29 mEq/L (ref 19–32)
Calcium: 9.9 mg/dL (ref 8.4–10.5)
Chloride: 102 mEq/L (ref 96–112)
Creatinine, Ser: 1.26 mg/dL (ref 0.40–1.50)
GFR: 69.74 mL/min (ref >60.00)
Glucose, Bld: 90 mg/dL (ref 70–99)
Potassium: 4.7 mEq/L (ref 3.5–5.1)
Sodium: 139 mEq/L (ref 135–145)
Total Bilirubin: 0.4 mg/dL (ref 0.2–1.2)
Total Protein: 6.8 g/dL (ref 6.0–8.3)

## 2019-12-07 LAB — IBC PANEL
Iron: 112 ug/dL (ref 42–165)
Saturation Ratios: 33.2 % (ref 20.0–50.0)
Transferrin: 241 mg/dL (ref 212.0–360.0)

## 2019-12-07 LAB — TSH: TSH: 0.72 u[IU]/mL (ref 0.35–4.50)

## 2019-12-07 LAB — FERRITIN: Ferritin: 1044.2 ng/mL — ABNORMAL HIGH (ref 22.0–322.0)

## 2019-12-07 NOTE — Progress Notes (Signed)
Subjective:    Patient ID: Travis [name], male    DOB: 1956/01/30, 64 y.o.   MRN: [id number]  HPI  Patient presents the clinic today for TCM hospital follow-up.  He presented to the ER 11/20/2019 with complaint of bilateral eye pain, blurry vision, dizziness, poor appetite and weight loss.  Ophthalmology was consulted, he was admitted for TIA work-up.  CT head and MRI brain negative. He was diagnosed with dry eyes and placed on Liquitears. He was found to be anemic as well.  He was transfused 1 unit of PRBC, given IV Feraheme and started on oral iron.  CT abdomen pelvis and chest was negative.  EGD was performed on 9 1 which showed grade a reflux esophagitis with a nonbleeding prepyloric gastric ulcer. He was started on Pantoprazole. His Entresto was held due to hypotension, which resolved by discharge, but he was advised not to restart this medication yet. He was discharged on 11/26/2019, advised to follow up with ophthalmology, GI, cardiology and PCP. Since discharge, he reports he intermittently has headaches. The headaches is located behind his eyes. He denies visual changes or dizziness at this time. He denies chest pain or shortness of breath. His appetite is better, and he denies abdominal pains. His bowels are moving normally, he has not seen any blood in his stool. He has gained 5 lbs back. He has not made an appt with eye doctor, cardiologist or GI at this time.  Review of Systems      Past Medical History:  Diagnosis Date  . Alcohol abuse   . 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 05/2019 Echo: EF 35-40%.  . Allergy   . Asthma   . 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 Chronic anemia   . Clotting disorder ([place])   . Diabetes mellitus, type 2 ([place])    pt reports his DM is gone  . GI bleed    15 years ago  . Gout   . Hemoptysis    secondary to pulmonary edema  . HFrEF (heart failure with reduced ejection fraction) ([place])    a. 12/2010 Echo: EF 20-25%; b  08/2013 Echo: EF 45-50%; c. 01/2015 Echo: EF 25-30%; d. 07/2016 Echo: EF 35-40%; e. 05/2019 Echo: EF 35-40%, glob HK. Mild LVH. Gr1 DD. Nl RV fxn.  . Hyperlipidemia   . Hypertension   . Nonischemic cardiomyopathy ([place])   . Nonobstructive Coronary artery disease    a. 01/2015 Cath: LM nl, LAD 30p/m, LCX nl, RCA 10p/m, RPDA min irregs.  . Osteoarthritis   . Pneumonia   . TIA (transient ischemic attack)   . Tobacco abuse   . Vision loss    peripherial vision only left eye.[place]  artery occusion     Current Outpatient Medications  Medication Sig Dispense Refill  . albuterol (PROVENTIL HFA;VENTOLIN HFA) 108 (90 BASE) MCG/ACT inhaler Inhale 2 puffs into the lungs every 6 (six) hours as needed for wheezing or shortness of breath. Take 2 puffs every 5-10 minutes up to 6 puffs total over 15 minutes when needed.  Use with a spacer. 1 Inhaler 2  . allopurinol (ZYLOPRIM) 100 MG tablet Take 2 tablets (200 mg total) by mouth daily. 180 tablet 2  . aspirin EC 325 MG tablet Take 162.5 mg by mouth daily.    [name] atorvastatin (LIPITOR) 20 MG tablet Take 1 tablet (20 mg total) by mouth daily. 90 tablet 3  . carvedilol (COREG) 3.125 MG tablet Take 1  tablet (3.125 mg total) by mouth 2 (two) times daily with a meal. 60 tablet 1  . cetirizine (ZYRTEC) 10 MG tablet TAKE 1 TABLET BY MOUTH DAILY (Patient taking differently: Take 10 mg by mouth daily. ) 30 tablet 11  . fenofibrate (TRICOR) 145 MG tablet TAKE 1 TABLET BY MOUTH ONCE A DAY (Patient taking differently: Take 145 mg by mouth daily. ) 90 tablet 0  . ferrous sulfate 325 (65 FE) MG tablet Take 1 tablet (325 mg total) by mouth daily with breakfast. 90 tablet 0  . fluticasone (FLONASE) 50 MCG/ACT nasal spray Place 1 spray into both nostrils 2 (two) times daily. (Patient taking differently: Place 1 spray into both nostrils daily as needed for allergies or rhinitis. ) 16 g 0  . Multiple Vitamin (MULTIVITAMIN WITH MINERALS) TABS tablet Take 1 tablet by mouth  daily. Men's One a Day    . Omega-3 Fatty Acids (FISH OIL PO) Take 1 capsule by mouth daily.    Derrill Memo ON 12/11/2019] pantoprazole (PROTONIX) 40 MG tablet Take 1 tablet (40 mg total) by mouth daily. 90 tablet 0  . polyvinyl alcohol (LIQUIFILM TEARS) 1.4 % ophthalmic solution Place 1 drop into both eyes 3 (three) times daily. 4.5 mL 0  . spironolactone (ALDACTONE) 25 MG tablet Take 0.5 tablets (12.5 mg total) by mouth daily. 90 tablet 3  . triamcinolone cream (KENALOG) 0.1 % APPLY TO AFFECTED AREAS TWICE A DAY AS NEEDED. AVOID FACE,GROIN, OR UNDERARMS (Patient taking differently: Apply 1 application topically 2 (two) times daily as needed (eczema). ) 454 g 0   No current facility-administered medications for this visit.    No Known Allergies  Family History  Problem Relation Age of Onset  . Diabetes Mother   . Hypertension Mother   . Diabetes Father   . Hypertension Father   . Diabetes Sister   . Dementia Brother   . Cancer Neg Hx   . Heart disease Neg Hx   . Stroke Neg Hx   . Colon cancer Neg Hx   . Esophageal cancer Neg Hx   . Rectal cancer Neg Hx   . Stomach cancer Neg Hx     Social History   Socioeconomic History  . Marital status: Single    Spouse name: Not on file  . Number of children: 0  . Years of education: Not on file  . Highest education level: High school graduate  Occupational History  . Occupation: retired  Tobacco Use  . Smoking status: Current Every Day Smoker    Packs/day: 0.50    Years: 30.00    Pack years: 15.00    Types: Cigarettes  . Smokeless tobacco: Never Used  Substance and Sexual Activity  . Alcohol use: Yes    Alcohol/week: 1.0 standard drink    Types: 1 Glasses of wine per week    Comment: occasionally  . Drug use: Yes    Types: Marijuana  . Sexual activity: Yes    Birth control/protection: Condom  Other Topics Concern  . Not on file  Social History Narrative  . Not on file   Social Determinants of Health   Financial Resource  Strain:   . Difficulty of Paying Living Expenses: Not on file  Food Insecurity:   . Worried About Charity fundraiser in the Last Year: Not on file  . Ran Out of Food in the Last Year: Not on file  Transportation Needs:   . Lack of Transportation (Medical): Not on file  .  Lack of Transportation (Non-Medical): Not on file  Physical Activity:   . Days of Exercise per Week: Not on file  . Minutes of Exercise per Session: Not on file  Stress:   . Feeling of Stress : Not on file  Social Connections:   . Frequency of Communication with Friends and Family: Not on file  . Frequency of Social Gatherings with Friends and Family: Not on file  . Attends Religious Services: Not on file  . Active Member of Clubs or Organizations: Not on file  . Attends Archivist Meetings: Not on file  . Marital Status: Not on file  Intimate Partner Violence:   . Fear of Current or Ex-Partner: Not on file  . Emotionally Abused: Not on file  . Physically Abused: Not on file  . Sexually Abused: Not on file     Constitutional: Pt reports intermittent headaches. Denies fever, malaise, fatigue, or abrupt weight changes.  HEENT: Pt reports dry eyes. Denies eye pain, eye redness, ear pain, ringing in the ears, wax buildup, runny nose, nasal congestion, bloody nose, or sore throat. Respiratory: Denies difficulty breathing, shortness of breath, cough or sputum production.   Cardiovascular: Pt reports swelling in feet, resolved. Denies chest pain, chest tightness, palpitations or swelling in the hands.  Gastrointestinal: Denies abdominal pain, bloating, constipation, diarrhea or blood in the stool.  GU: Denies urgency, frequency, pain with urination, burning sensation, blood in urine, odor or discharge. Musculoskeletal: Pt reports chronic back and leg pain. Denies decrease in range of motion, difficulty with gait, or joint swelling.  Skin: Denies redness, rashes, lesions or ulcercations.  Neurological: Denies  dizziness, difficulty with memory, difficulty with speech or problems with balance and coordination.  Psych: Denies anxiety, depression, SI/HI.  No other specific complaints in a complete review of systems (except as listed in HPI above).  Objective:   Physical Exam   BP 120/70   Pulse 88   Temp 97.9 F (36.6 C) (Temporal)   Wt 177 lb (80.3 kg)   SpO2 98%   BMI 21.55 kg/m   Wt Readings from Last 3 Encounters:  12/07/19 177 lb (80.3 kg)  11/26/19 177 lb 11.1 oz (80.6 kg)  11/17/19 172 lb 2 oz (78.1 kg)    General: Appears this stated age, in NAD. Skin: Warm, dry and intact. No rashes or ulcerations noted. HEENT: Head: normal shape and size; Eyes: sclera white, no icterus, conjunctiva pink, PERRLA and EOMs intact;  Cardiovascular: Normal rate and rhythm. S1,S2 noted.  No murmur, rubs or gallops noted. No JVD or BLE edema.  Pulmonary/Chest: Normal effort and positive vesicular breath sounds. No respiratory distress. No wheezes, rales or ronchi noted.  Abdomen: Soft and nontender. Normal bowel sounds. No distention or masses noted. Musculoskeletal: Gait slow and steady without device. Neurological: Alert and oriented. Cranial nerves II-XII grossly intact. Coordination normal.  Psychiatric: Mood and affect normal. Behavior is normal. Judgment and thought content normal.     BMET    Component Value Date/Time   NA 133 (L) 11/25/2019 0659   NA 141 06/18/2017 1425   NA 141 04/20/2014 1019   K 4.1 11/25/2019 0659   K 3.7 04/20/2014 1019   CL 101 11/25/2019 0659   CL 106 04/20/2014 1019   CO2 23 11/25/2019 0659   CO2 30 04/20/2014 1019   GLUCOSE 89 11/25/2019 0659   GLUCOSE 102 (H) 04/20/2014 1019   BUN 7 (L) 11/25/2019 0659   BUN 16 06/18/2017 1425   BUN  14 04/20/2014 1019   CREATININE 1.06 11/25/2019 0659   CREATININE 1.18 04/20/2014 1019   CALCIUM 8.8 (L) 11/25/2019 0659   CALCIUM 9.3 04/20/2014 1019   GFRNONAA >60 11/25/2019 0659   GFRNONAA >60 04/20/2014 1019    GFRNONAA >60 11/19/2012 1107   GFRAA >60 11/25/2019 0659   GFRAA >60 04/20/2014 1019   GFRAA >60 11/19/2012 1107    Lipid Panel     Component Value Date/Time   CHOL 150 11/09/2019 0911   CHOL 138 06/18/2017 1425   TRIG 77 11/09/2019 0911   HDL 47 11/09/2019 0911   HDL 67 06/18/2017 1425   CHOLHDL 3.2 11/09/2019 0911   VLDL 15 11/09/2019 0911   LDLCALC 88 11/09/2019 0911   LDLCALC 51 06/18/2017 1425    CBC    Component Value Date/Time   WBC 4.8 11/25/2019 0659   RBC 2.75 (L) 11/25/2019 0659   HGB 9.1 (L) 11/25/2019 0659   HGB 13.5 04/20/2014 1019   HCT 26.4 (L) 11/25/2019 0659   HCT 41.8 04/20/2014 1019   PLT 228 11/25/2019 0659   PLT 348 04/20/2014 1019   MCV 96.0 11/25/2019 0659   MCV 90 04/20/2014 1019   MCH 33.1 11/25/2019 0659   MCHC 34.5 11/25/2019 0659   RDW 16.0 (H) 11/25/2019 0659   RDW 16.2 (H) 04/20/2014 1019   LYMPHSABS 0.7 11/20/2019 1600   LYMPHSABS 2.1 04/20/2014 1019   MONOABS 0.4 11/20/2019 1600   MONOABS 0.5 04/20/2014 1019   EOSABS 0.2 11/20/2019 1600   EOSABS 0.3 04/20/2014 1019   BASOSABS 0.0 11/20/2019 1600   BASOSABS 0.1 04/20/2014 1019   BASOSABS 0 06/13/2012 1515    Hgb A1C Lab Results  Component Value Date   HGBA1C 7.3 (H) 11/09/2019          Assessment & Plan:   [facility] Follow Up for Acute Gastric Ulcer, Unintentional Weight Loss, Iron Deficiency Anemia, Dry Eyes:  Hospital notes, labs and imaging reviewed Will check CBC with diff, CMET, TSH, IBC panel and Ferritin He will continue oral iron and Pantoprazole- avoid NSAID's, continue Tylenol prn He has an appt with cardiology scheduled 9/24, will discuss restarting Entresto at that time He will call to make appts with ophthalmology and GI, numbers and address provided  Will follow up after labs, return precautions discussed [name], NP This visit occurred during the SARS-CoV-2 public health emergency.  Safety protocols were in place, including screening questions  prior to the visit, additional usage of staff PPE, and extensive cleaning of exam room while observing appropriate contact time as indicated for disinfecting solutions.

## 2019-12-07 NOTE — Patient Instructions (Signed)
Peptic Ulcer  A peptic ulcer is a sore in the lining of the stomach (gastric ulcer) or the first part of the small intestine (duodenal ulcer). The ulcer causes a gradual wearing away (erosion) of the deeper tissue. What are the causes? Normally, the lining of the stomach and the small intestine protects them from the acid that digests food. The protective lining can be damaged by:  An infection caused by a type of bacteria called Helicobacter pylori or H. pylori.  Regular use of NSAIDs, such as ibuprofen or aspirin.  Rare tumors in the stomach, small intestine, or pancreas (Zollinger-Ellison syndrome). What increases the risk? The following factors may make you more likely to develop this condition:  Smoking.  Having a family history of ulcer disease.  Drinking alcohol.  Having been hospitalized in an intensive care unit (ICU). What are the signs or symptoms? Symptoms of this condition include:  Persistent burning pain in the area between the chest and the belly button. The pain may be worse on an empty stomach and at night.  Heartburn.  Nausea and vomiting.  Bloating. If the ulcer results in bleeding, it can cause:  Black, tarry stools.  Vomiting of bright red blood.  Vomiting of material that looks like coffee grounds. How is this diagnosed? This condition may be diagnosed based on:  Your medical history and a physical exam.  Various tests or procedures, such as: ? Blood tests, stool tests, or breath tests to check for the H. pylori bacteria. ? An X-ray exam (upper gastrointestinal series) of the esophagus, stomach, and small intestine. ? Upper endoscopy. The health care provider examines the esophagus, stomach, and small intestine using a small flexible tube that has a video camera at the end. ? Biopsy. A tissue sample is removed to be examined under a microscope. How is this treated? Treatment for this condition may include:  Eliminating the cause of the ulcer,  such as smoking or use of NSAIDs, and limiting alcohol and caffeine intake.  Medicines to reduce the amount of acid in your digestive tract.  Antibiotic medicines, if the ulcer is caused by an H. pylori infection.  An upper endoscopy may be used to treat a bleeding ulcer.  Surgery. This may be needed if the bleeding is severe or if the ulcer created a hole somewhere in the digestive system. Follow these instructions at home:  Do not drink alcohol if your health care provider tells you not to drink.  Do not use any products that contain nicotine or tobacco, such as cigarettes, e-cigarettes, and chewing tobacco. If you need help quitting, ask your health care provider.  Take over-the-counter and prescription medicines only as told by your health care provider. ? Do not use over-the-counter medicines in place of prescription medicines unless your health care provider approves. ? Do not take aspirin, ibuprofen, or other NSAIDs unless your health care provider told you to do so.  Take over-the-counter and prescription medicines only as told by your health care provider.  Keep all follow-up visits as told by your health care provider. This is important. Contact a health care provider if:  Your symptoms do not improve within 7 days of starting treatment.  You have ongoing indigestion or heartburn. Get help right away if:  You have sudden, sharp, or persistent pain in your abdomen.  You have bloody or dark black, tarry stools.  You vomit blood or material that looks like coffee grounds.  You become light-headed or you feel faint.    You become weak.  You become sweaty or clammy. Summary  A peptic ulcer is a sore in the lining of the stomach (gastric ulcer) or the first part of the small intestine (duodenal ulcer). The ulcer causes a gradual wearing away (erosion) of the deeper tissue.  Do not use any products that contain nicotine or tobacco, such as cigarettes, e-cigarettes, and  chewing tobacco. If you need help quitting, ask your health care provider.  Take over-the-counter and prescription medicines only as told by your health care provider. Do not use over-the-counter medicines in place of prescription medicines unless your health care provider approves.  Contact your health care provider if you have ongoing indigestion or heartburn.  Keep all follow-up visits as told by your health care provider. This is important. This information is not intended to replace advice given to you by your health care provider. Make sure you discuss any questions you have with your health care provider. Document Revised: 09/17/2017 Document Reviewed: 09/17/2017 Elsevier Patient Education  2020 [employer]  

## 2019-12-08 ENCOUNTER — Other Ambulatory Visit: Payer: Self-pay | Admitting: *Deleted

## 2019-12-08 ENCOUNTER — Encounter: Payer: Self-pay | Admitting: *Deleted

## 2019-12-08 NOTE — Patient Outreach (Addendum)
Travis [name] [facility]) Care Management  [place]  12/08/2019   Travis [name] September 27, 1955 [id number]  Subjective: Telephone call to patient's home / mobile number, spoke with patient, and HIPAA verified.  Discussed [facility]ia[facility] referral follow up, patient voiced understanding, and is in agreement to follow up.  Patient states he is doing okay and ready to enroll in program.  States he lost his Medicaid card a few months ago, has requested a new card, has not received new card, and  will follow up as needed. States he had a follow up appointment with primary provider on 12/07/2019 and appointment went well.   States he will have a follow up appointment with cardiologist on 12/15/2019.   States he will also have a follow up appointment with Eye MD in a couple of weeks and is unsure of the exact date.   States he is still experiencing vision issues due to history of a blood clot in his eye.  States he had a recent hospitalization for hypotension and is aware of precautions when changing positions.  Patient states he is aware of signs/ symptoms to report, how to reach provider if needed after hours, when to go to ED, and / or call 911.   Patient states he is able to manage self care and has assistance as needed.  Patient voices understanding of medical diagnosis and treatment plan.   States he takes a pill for diabetes, does not have a meter, has never checked his blood sugar, and will follow up with provider to discuss.  Patient requesting education related to congestive heart failure.   States he only checks weight when he is sick.  States he has lost approximately 10 pounds over the last 6 months when he was sick, did not feel like eating, but has gained it back, within the last 3 months.   States he will follow up with MD regarding class of heart failure and target weight. Discussed Advanced Directives, advised of [place] [facility]  Advanced Directives packet  resource, patient voices understanding, and requested to be sent packet at this time.   RNCM advised of [employer] document scanning  system for Advanced Directives documents via local providers office or hospitals, patient  voices understanding, and he will discuss accessing through patient's primary provider if needed in the future.    States he is accessing his Medicare benefits as needed via member services number on back of card.  Patient states he does not have any transportation, [employer], or pharmacy needs at this time.  States he is very appreciative of the follow up and is in agreement to continue to receive [place] [facility] information / services.      Objective: Per KPN (Knowledge Performance Now, point of care tool) and chart review,patient hospitalized 11/20/2019, remains inpatient for hypotension. Patient also has a history of the following: COPD, diabetes, chronic systolic congestive heart failure, Tobacco use, alcohol use, chronic kidney disease stage 3, nonischemic cardiomyopathy,left central retinal occlusion, hypertension,hyperlipidemia, andTIA. Patient had A1C of 7.3 on 11/09/2019.     Encounter Medications:  Outpatient Encounter Medications as of 12/08/2019  Medication Sig Note   albuterol (PROVENTIL HFA;VENTOLIN HFA) 108 (90 BASE) MCG/ACT inhaler Inhale 2 puffs into the lungs every 6 (six) hours as needed for wheezing or shortness of breath. Take 2 puffs every 5-10 minutes up to 6 puffs total over 15 minutes when needed.  Use with a spacer.    allopurinol (ZYLOPRIM)  100 MG tablet Take 2 tablets (200 mg total) by mouth daily. 11/20/2019: Patient took 1 tablet today   aspirin EC 325 MG tablet Take 162.5 mg by mouth daily. 12/08/2019: Patient states he takes regular ASA, takes half in am and in pm, daily.   States MD aware.    atorvastatin (LIPITOR) 20 MG tablet Take 1 tablet (20 mg total) by mouth daily.    carvedilol (COREG) 3.125 MG tablet Take 1 tablet  (3.125 mg total) by mouth 2 (two) times daily with a meal.    cetirizine (ZYRTEC) 10 MG tablet TAKE 1 TABLET BY MOUTH DAILY (Patient taking differently: Take 10 mg by mouth daily. )    fenofibrate (TRICOR) 145 MG tablet TAKE 1 TABLET BY MOUTH ONCE A DAY (Patient taking differently: Take 145 mg by mouth daily. )    ferrous sulfate 325 (65 FE) MG tablet Take 1 tablet (325 mg total) by mouth daily with breakfast.    fluticasone (FLONASE) 50 MCG/ACT nasal spray Place 1 spray into both nostrils 2 (two) times daily. (Patient taking differently: Place 1 spray into both nostrils daily as needed for allergies or rhinitis. )    Multiple Vitamin (MULTIVITAMIN WITH MINERALS) TABS tablet Take 1 tablet by mouth daily. Men's One a Day    Omega-3 Fatty Acids (FISH OIL PO) Take 1 capsule by mouth daily.    [START ON 12/11/2019] pantoprazole (PROTONIX) 40 MG tablet Take 1 tablet (40 mg total) by mouth daily.    polyvinyl alcohol (LIQUIFILM TEARS) 1.4 % ophthalmic solution Place 1 drop into both eyes 3 (three) times daily.    spironolactone (ALDACTONE) 25 MG tablet Take 0.5 tablets (12.5 mg total) by mouth daily.    triamcinolone cream (KENALOG) 0.1 % APPLY TO AFFECTED AREAS TWICE A DAY AS NEEDED. AVOID FACE,GROIN, OR UNDERARMS (Patient taking differently: Apply 1 application topically 2 (two) times daily as needed (eczema). )    No facility-administered encounter medications on file as of 12/08/2019.    Functional Status:  In your present state of health, do you have any difficulty performing the following activities: 11/09/2019 04/16/2019  Hearing? N N  Vision? N N  Difficulty concentrating or making decisions? N N  Walking or climbing stairs? Y N  Comment secondary to dizziness and hypotension -  Dressing or bathing? N N  Doing errands, shopping? N N  Some recent data might be hidden    Fall/Depression Screening: Fall Risk  12/08/2019 11/17/2019 04/16/2019  Falls in the past year? 0 0 0  Number  falls in past yr: 0 0 0  Injury with Fall? 0 0 0  Risk for fall due to : - - -  Follow up Falls evaluation completed;Education provided Falls evaluation completed Falls evaluation completed   PHQ 2/9 Scores 12/08/2019 12/30/2018 10/15/2017 07/30/2017 04/17/2017 01/15/2017 09/20/2016  PHQ - 2 Score 0 1 0 0 0 0 0  PHQ- 9 Score - 3 - - - - -     Assessment: Received Med Pay / Medicare [facility] Liaison referral on 11/26/2019. Referral source: Natividad Brood. Referral reason:  Please assign to [place] Coordinator for complex care and disease       management [Heart Failure, GIB, DM] follow up calls and assess for       further needs. His niece is also his contact person. PCP does the TOC       at [facility].  Screening follow up completed and will follow up  for congestive heart failure monitoring, education, and care coordination.  Goals Addressed              This Visit's Progress     Patient Stated wants his heart function/ capacity to be above 80 %. (pt-stated)        CARE PLAN ENTRY (see longitudinal plan of care for additional care plan information)   Current Barriers:   Knowledge deficit related to basic heart failure pathophysiology and self care management  Case Manager Clinical Goal(s):   Over the next 60 days, patient will verbalize understanding of Heart Failure Action Plan and when to call doctor  Patient will verbalize two symptoms of CHF exacerbation within the next 30 days.    Interventions:   Provided verbal education on low sodium diet  Discussed importance of daily weight and advised patient to weigh and record daily  Patient Self Care Activities:   Takes Heart Failure Medications as prescribed  Adheres to low sodium diet  Initial goal documentation          Plan: RNCM will send patient welcome outreach letter, welcome packet, consent,  Advanced Directives packet, [place] pamphlet, [facility] calendar, and  magnet. RNCM will send primary MD barrier/ involvement letter and route assessment.  RNCM will call patient for  telephone outreach attempt within 30 business days, congestive heart failure assessment follow up, and proceed with case closure, after 4th unsuccessful outreach call.    Nissi Doffing H. Annia Friendly, BSN, [place] [facility] Telephonic CM Phone: [phone] Fax: [phone]

## 2019-12-09 ENCOUNTER — Telehealth: Payer: Self-pay

## 2019-12-09 NOTE — Telephone Encounter (Signed)
-----   Message from Yetta Flock, MD sent at 12/07/2019  5:42 PM EDT ----- Regarding: follow up Jan can you help coordinate a follow up office visit with me or PA? Thanks

## 2019-12-09 NOTE — Telephone Encounter (Signed)
Called and spoke to pt.  Scheduled him with Dr. Havery Moros on 10-28. Pt had recent EGD at [facility] with [facility] for epi pain, weight loss, loss of appetite, anemia.  May need repeat EGD for Gastic ulcer.

## 2019-12-15 ENCOUNTER — Encounter: Payer: Self-pay | Admitting: Cardiovascular Disease

## 2019-12-15 ENCOUNTER — Ambulatory Visit (INDEPENDENT_AMBULATORY_CARE_PROVIDER_SITE_OTHER): Payer: Medicare Other | Admitting: Cardiovascular Disease

## 2019-12-15 ENCOUNTER — Other Ambulatory Visit: Payer: Self-pay

## 2019-12-15 VITALS — BP 150/84 | HR 92 | Ht 76.0 in | Wt 178.5 lb

## 2019-12-15 DIAGNOSIS — I10 Essential (primary) hypertension: Secondary | ICD-10-CM

## 2019-12-15 DIAGNOSIS — I428 Other cardiomyopathies: Secondary | ICD-10-CM

## 2019-12-15 DIAGNOSIS — I5022 Chronic systolic (congestive) heart failure: Secondary | ICD-10-CM

## 2019-12-15 DIAGNOSIS — E782 Mixed hyperlipidemia: Secondary | ICD-10-CM | POA: Diagnosis not present

## 2019-12-15 MED ORDER — ENTRESTO 24-26 MG PO TABS: 1.0000 | ORAL_TABLET | Freq: Two times a day (BID) | ORAL | 5 refills | Status: DC

## 2019-12-15 NOTE — Progress Notes (Signed)
Cardiology Office Note   Date:  12/15/2019   ID:  Travis [name], DOB [birth year], MRN [id number]  PCP:  [name], NP  Cardiologist:   Kathlyn Sacramento, MD   Chief Complaint  Patient presents with   OTHER    6 month f/u no complaints today. Meds reviewed verbally with pt.      History of Present Illness: Travis [name] is a 64 y.o. 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 He has known history of mild nonobstructive one-vessel coronary artery disease, history of TIA, tobacco use, previous excessive alcohol use and hyperlipidemia. Echocardiogram in November 2016 showed worsening LV systolic dysfunction with an ejection fraction of 25-30%.  Cardiac catheterization showed mild nonobstructive one-vessel coronary artery disease. LVEDP was mildly elevated.  He was hospitalized in August with transient vision loss and dizziness.  Stroke work-up was negative.  He was noted to be anemic with a hemoglobin of 9.  Negative occult blood.  He was also noted to have acute renal failure with a creatinine of 3.29.  Renal function improved with hydration.  However, he was still discharged home with a creatinine of 2.4 and was sent home on the same days Entresto and spironolactone. He was rehospitalized the end of August with dizziness and blurred vision and was found to be febrile and hypotensive.  His hemoglobin was down to 6.6 and he was transfused.  Anemia work-up revealed large grade a reflux esophagitis.  The patient was taken off Entresto due to hypotension.  His fever was of unknown origin.  His anemia subsequently improved and most recent CBC showed a hemoglobin of 11.1.  In addition, renal function was back to baseline with most recent creatinine of 1.26.  He feels better with no chest pain or shortness of breath.  No dizziness.  He stopped drinking alcohol after recent hospitalization. He had an  echocardiogram done in August while hospitalized which showed improvement in EF to 45 to 50%.    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 05/2019 Echo: EF 35-40%.   Allergy    Asthma    Central retinal artery occlusion of left eye 09/13/13   Chronic anemia    Clotting disorder ([place])    Diabetes mellitus, type 2 ([place])    pt reports his DM is gone   GI bleed    15 years ago   Gout    Hemoptysis    secondary to pulmonary edema   HFrEF (heart failure with reduced ejection fraction) ([place])    a. 12/2010 Echo: EF 20-25%; b 08/2013 Echo: EF 45-50%; c. 01/2015 Echo: EF 25-30%; d. 07/2016 Echo: EF 35-40%; e. 05/2019 Echo: EF 35-40%, glob HK. Mild LVH. Gr1 DD. Nl RV fxn.   Hyperlipidemia    Hypertension    Nonischemic cardiomyopathy (HCC)    Nonobstructive Coronary artery disease    a. 01/2015 Cath: LM nl, LAD 30p/m, LCX nl, RCA 10p/m, RPDA min irregs.   Osteoarthritis    Pneumonia    TIA (transient ischemic attack)    Tobacco abuse    Vision loss    peripherial vision only left eye.Centra Retinla  artery occusion     Past Surgical History:  Procedure Laterality Date   BIOPSY  11/25/2019   Procedure: BIOPSY;  Surgeon: Ladene Artist, MD;  Location: [place];  Service:  Endoscopy;;   CARDIAC CATHETERIZATION     CARDIAC CATHETERIZATION N/A 01/28/2015   Procedure: Left Heart Cath and Coronary Angiography;  Surgeon: Wellington Hampshire, MD;  Location: [place] CV [facility];  Service: Cardiovascular;  Laterality: N/A;   COLONOSCOPY     ESOPHAGOGASTRODUODENOSCOPY (EGD) WITH PROPOFOL N/A 11/25/2019   Procedure: ESOPHAGOGASTRODUODENOSCOPY (EGD) WITH PROPOFOL;  Surgeon: Ladene Artist, MD;  Location: [facility] END[facility]COPY;  Service: Endoscopy;  Laterality: N/A;   HIP ARTHROPLASTY Right 03/15/2013   Procedure: ARTHROPLASTY BIPOLAR HIP;  Surgeon: [name], MD;  Location: [place];  Service: Orthopedics;   Laterality: Right;   TOTAL SHOULDER ARTHROPLASTY Right 09/01/2015   Procedure: RIGHT TOTAL SHOULDER ARTHROPLASTY;  Surgeon: [name], MD;  Location: [place];  Service: Orthopedics;  Laterality: Right;     Current Outpatient Medications  Medication Sig Dispense Refill   albuterol (PROVENTIL HFA;VENTOLIN HFA) 108 (90 BASE) MCG/ACT inhaler Inhale 2 puffs into the lungs every 6 (six) hours as needed for wheezing or shortness of breath. Take 2 puffs every 5-10 minutes up to 6 puffs total over 15 minutes when needed.  Use with a spacer. 1 Inhaler 2   allopurinol (ZYLOPRIM) 100 MG tablet Take 2 tablets (200 mg total) by mouth daily. 180 tablet 2   aspirin EC 325 MG tablet Take 162.5 mg by mouth daily.     atorvastatin (LIPITOR) 20 MG tablet Take 1 tablet (20 mg total) by mouth daily. 90 tablet 3   carvedilol (COREG) 3.125 MG tablet Take 1 tablet (3.125 mg total) by mouth 2 (two) times daily with a meal. 60 tablet 1   cetirizine (ZYRTEC) 10 MG tablet TAKE 1 TABLET BY MOUTH DAILY (Patient taking differently: Take 10 mg by mouth daily. ) 30 tablet 11   fenofibrate (TRICOR) 145 MG tablet TAKE 1 TABLET BY MOUTH ONCE A DAY (Patient taking differently: Take 145 mg by mouth daily. ) 90 tablet 0   ferrous sulfate 325 (65 FE) MG tablet Take 1 tablet (325 mg total) by mouth daily with breakfast. 90 tablet 0   fluticasone (FLONASE) 50 MCG/ACT nasal spray Place 1 spray into both nostrils 2 (two) times daily. (Patient taking differently: Place 1 spray into both nostrils daily as needed for allergies or rhinitis. ) 16 g 0   Multiple Vitamin (MULTIVITAMIN WITH MINERALS) TABS tablet Take 1 tablet by mouth daily. Men's One a Day     Omega-3 Fatty Acids (FISH OIL PO) Take 1 capsule by mouth daily.     pantoprazole (PROTONIX) 40 MG tablet Take 1 tablet (40 mg total) by mouth daily. 90 tablet 0   polyvinyl alcohol (LIQUIFILM TEARS) 1.4 % ophthalmic solution Place 1 drop into both eyes 3 (three) times daily.  4.5 mL 0   spironolactone (ALDACTONE) 25 MG tablet Take 0.5 tablets (12.5 mg total) by mouth daily. 90 tablet 3   triamcinolone cream (KENALOG) 0.1 % APPLY TO AFFECTED AREAS TWICE A DAY AS NEEDED. AVOID FACE,GROIN, OR UNDERARMS (Patient taking differently: Apply 1 application topically 2 (two) times daily as needed (eczema). ) 454 g 0   No current facility-administered medications for this visit.    Allergies:   Patient has no known allergies.    Social History:  The patient  reports that he has been smoking cigarettes. He has a 15.00 pack-year smoking history. He has never used smokeless tobacco. He reports current alcohol use of about 1.0 standard drink of alcohol per week. He reports current drug use. Drug: Marijuana.  Family History:  The patient's family history includes Dementia in his brother; Diabetes in his father, mother, and sister; Hypertension in his father and mother.    ROS:  Please see the history of present illness.   Otherwise, review of systems are positive for none.   All other systems are reviewed and negative.    PHYSICAL EXAM: VS:  BP (!) 150/84 (BP Location: Left Arm, Patient Position: Sitting, Cuff Size: Normal)    Pulse 92    Ht 6\' 4"  (1.93 m)    Wt 178 lb 8 oz (81 kg)    SpO2 98%    BMI 21.73 kg/m  , BMI Body mass index is 21.73 kg/m. GEN: Well nourished, well developed, in no acute distress  HEENT: normal  Neck: no JVD, carotid bruits, or masses Cardiac: RRR; no murmurs, rubs, or gallops,no edema  Respiratory:  clear to auscultation bilaterally, normal work of breathing GI: soft, nontender, nondistended, + BS MS: no deformity or atrophy  Skin: warm and dry, no rash Neuro:  Strength and sensation are intact Psych: euthymic mood, full affect   EKG:  EKG is  ordered today. EKG showed normal sinus rhythm with left intrafascicular block and inferolateral T wave changes suggestive of ischemia.    Recent Labs: 11/20/2019: B Natriuretic Peptide  365.0 12/07/2019: ALT 11; BUN 16; Creatinine, Ser 1.26; Hemoglobin 11.1; Platelets 322.0; Potassium 4.7; Sodium 139; TSH 0.72    Lipid Panel    Component Value Date/Time   CHOL 150 11/09/2019 0911   CHOL 138 06/18/2017 1425   TRIG 77 11/09/2019 0911   HDL 47 11/09/2019 0911   HDL 67 06/18/2017 1425   CHOLHDL 3.2 11/09/2019 0911   VLDL 15 11/09/2019 0911   LDLCALC 88 11/09/2019 0911   LDLCALC 51 06/18/2017 1425   LDLDIRECT 69.0 12/30/2018 1232      Wt Readings from Last 3 Encounters:  12/15/19 178 lb 8 oz (81 kg)  12/07/19 177 lb (80.3 kg)  11/26/19 177 lb 11.1 oz (80.6 kg)       ASSESSMENT AND PLAN: MN  1.   Chronic systolic heart failure: Due to suspected alcohol-induced cardiomyopathy.  Most recent ejection fraction was 45 to 50%.  [name] was discontinued recently in the setting of hypotension, renal failure and severe anemia.  All of these issues have been corrected since then.  I am going to resume Entresto but at a lower dose of 24/26 mg twice daily.  Check basic metabolic profile in 1 week.  Return in 1 month for up titration of medications.  Continue carvedilol and spironolactone.    2. Essential hypertension: Blood pressure is elevated today.  [name] was resumed at a lower dose.  3. Tobacco use: He continues to smoke and has not been able to quit smoking.   4.  Hyperlipidemia: Currently on atorvastatin 20 mg daily.  Most recent lipid profile showed an LDL of 88.  5.  Alcohol use: The patient reports that he quit since his recent hospitalization.    Disposition:   FU with APP in 1 month.  Signed,  Kathlyn Sacramento, MD  12/15/2019 3:25 PM    [place]

## 2019-12-15 NOTE — Patient Instructions (Signed)
Medication Instructions:  Your physician has recommended you make the following change in your medication:   RESUME Entresto at a lower dose of 24/26 mg twice daily. An Rx has been sent to your pharmacy.   *If you need a refill on your cardiac medications before your next appointment, please call your pharmacy*   Lab Work: Your physician recommends that you return for lab work in: (bmet) 1 week   Please have your lab drawn at the [facility]. You doe not need an appointment. Lab hours are Mon-Fri 7:30am-6pm  If you have labs (blood work) drawn today and your tests are completely normal, you will receive your results only by: [name] MyChart Message (if you have MyChart) OR . A paper copy in the mail If you have any lab test that is abnormal or we need to change your treatment, we will call you to review the results.   Testing/Procedures: None ordered   Follow-Up: At [facility], you and your health needs are our priority.  As part of our continuing mission to provide you with exceptional heart care, we have created designated Provider Care Teams.  These Care Teams include your primary Cardiologist (physician) and Advanced Practice Providers (APPs -  Physician Assistants and Nurse Practitioners) who all work together to provide you with the care you need, when you need it.  We recommend signing up for the patient portal called "MyChart".  Sign up information is provided on this After Visit Summary.  MyChart is used to connect with patients for Virtual Visits (Telemedicine).  Patients are able to view lab/test results, encounter notes, upcoming appointments, etc.  Non-urgent messages can be sent to your provider as well.   To learn more about what you can do with MyChart, go to [email or web address].    Your next appointment:   4 week(s)  The format for your next appointment:   In Person  Provider:   You may see Kathlyn Sacramento, MD or one of the following Advanced Practice  Providers on your designated Care Team:    Murray Hodgkins, NP  Christell Faith, PA-C  Marrianne Mood, PA-C  Cadence Kathlen Mody, [place]    Other Instructions N/A

## 2019-12-21 ENCOUNTER — Other Ambulatory Visit: Payer: Self-pay | Admitting: Internal Medicine

## 2019-12-22 ENCOUNTER — Other Ambulatory Visit: Payer: Self-pay | Admitting: Internal Medicine

## 2019-12-22 ENCOUNTER — Other Ambulatory Visit
Admission: RE | Admit: 2019-12-22 | Discharge: 2019-12-22 | Disposition: A | Discharge status: Home / Self Care | Payer: Medicare Other | Attending: Cardiovascular Disease | Admitting: Cardiovascular Disease

## 2019-12-22 DIAGNOSIS — I5022 Chronic systolic (congestive) heart failure: Secondary | ICD-10-CM | POA: Diagnosis not present

## 2019-12-22 DIAGNOSIS — I10 Essential (primary) hypertension: Secondary | ICD-10-CM | POA: Insufficient documentation

## 2019-12-22 DIAGNOSIS — I428 Other cardiomyopathies: Secondary | ICD-10-CM | POA: Diagnosis not present

## 2019-12-22 LAB — BASIC METABOLIC PANEL
Anion gap: 9 (ref 5–15)
BUN: 25 mg/dL — ABNORMAL HIGH (ref 8–23)
CO2: 25 mmol/L (ref 22–32)
Calcium: 10 mg/dL (ref 8.9–10.3)
Chloride: 101 mmol/L (ref 98–111)
Creatinine, Ser: 1.68 mg/dL — ABNORMAL HIGH (ref 0.61–1.24)
GFR calc Af Amer: 49 mL/min — ABNORMAL LOW (ref >60)
GFR calc non Af Amer: 43 mL/min — ABNORMAL LOW (ref >60)
Glucose, Bld: 140 mg/dL — ABNORMAL HIGH (ref 70–99)
Potassium: 4.5 mmol/L (ref 3.5–5.1)
Sodium: 135 mmol/L (ref 135–145)

## 2019-12-22 NOTE — Addendum Note (Signed)
Addended by: Santiago Bur on: 12/22/2019 11:47 AM   Modules accepted: Orders

## 2019-12-25 ENCOUNTER — Telehealth: Payer: Self-pay

## 2019-12-25 DIAGNOSIS — I5022 Chronic systolic (congestive) heart failure: Secondary | ICD-10-CM

## 2019-12-25 NOTE — Telephone Encounter (Signed)
-----   Message from Wellington Hampshire, MD sent at 12/25/2019 11:48 AM EDT ----- Mildly abnormal kidney function since Entresto was resumed.  Continue same medications for now but recommend repeat basic metabolic profile in 1 week to ensure stability.

## 2019-12-25 NOTE — Telephone Encounter (Signed)
Patient made aware of lab results and Dr. Tyrell Antonio recommendation with verbalized understanding. Lab order placed for 1 wk repeat bmet [facility].

## 2019-12-30 ENCOUNTER — Other Ambulatory Visit: Payer: Self-pay | Admitting: *Deleted

## 2019-12-30 ENCOUNTER — Encounter: Payer: Self-pay | Admitting: Family Medicine

## 2019-12-30 ENCOUNTER — Ambulatory Visit (INDEPENDENT_AMBULATORY_CARE_PROVIDER_SITE_OTHER)
Admission: RE | Admit: 2019-12-30 | Discharge: 2019-12-30 | Disposition: A | Discharge status: Home / Self Care | Payer: Medicare Other | Source: Ambulatory Visit | Attending: Family Medicine | Admitting: Family Medicine

## 2019-12-30 ENCOUNTER — Ambulatory Visit (INDEPENDENT_AMBULATORY_CARE_PROVIDER_SITE_OTHER): Payer: Medicare Other | Admitting: Family Medicine

## 2019-12-30 ENCOUNTER — Other Ambulatory Visit: Payer: Self-pay

## 2019-12-30 VITALS — BP 116/68 | HR 103 | Temp 98.0°F | Ht 76.0 in | Wt 176.2 lb

## 2019-12-30 DIAGNOSIS — M7989 Other specified soft tissue disorders: Secondary | ICD-10-CM

## 2019-12-30 DIAGNOSIS — M79642 Pain in left hand: Secondary | ICD-10-CM | POA: Diagnosis not present

## 2019-12-30 LAB — CBC WITH DIFFERENTIAL/PLATELET
Basophils Absolute: 0.1 10*3/uL (ref 0.0–0.1)
Basophils Relative: 1.3 % (ref 0.0–3.0)
Eosinophils Absolute: 1.2 10*3/uL — ABNORMAL HIGH (ref 0.0–0.7)
Eosinophils Relative: 12.8 % — ABNORMAL HIGH (ref 0.0–5.0)
HCT: 35.9 % — ABNORMAL LOW (ref 39.0–52.0)
Hemoglobin: 12.1 g/dL — ABNORMAL LOW (ref 13.0–17.0)
Lymphocytes Relative: 14.2 % (ref 12.0–46.0)
Lymphs Abs: 1.3 10*3/uL (ref 0.7–4.0)
MCHC: 33.7 g/dL (ref 30.0–36.0)
MCV: 99.9 fl (ref 78.0–100.0)
Monocytes Absolute: 0.5 10*3/uL (ref 0.1–1.0)
Monocytes Relative: 5.5 % (ref 3.0–12.0)
Neutro Abs: 6 10*3/uL (ref 1.4–7.7)
Neutrophils Relative %: 66.2 % (ref 43.0–77.0)
Platelets: 349 10*3/uL (ref 150.0–400.0)
RBC: 3.59 Mil/uL — ABNORMAL LOW (ref 4.22–5.81)
RDW: 17.2 % — ABNORMAL HIGH (ref 11.5–15.5)
WBC: 9.1 10*3/uL (ref 4.0–10.5)

## 2019-12-30 LAB — URIC ACID: Uric Acid, Serum: 4.5 mg/dL (ref 4.0–7.8)

## 2019-12-30 MED ORDER — PREDNISONE 10 MG PO TABS: ORAL_TABLET | ORAL | 0 refills | Status: DC

## 2019-12-30 MED ORDER — TRAMADOL HCL 50 MG PO TABS: 50.0000 mg | ORAL_TABLET | Freq: Three times a day (TID) | ORAL | 0 refills | Status: AC | PRN

## 2019-12-30 NOTE — Patient Instructions (Signed)
Good to see you today  I think you are having a gout flare  I have sent prednisone and Tramadol to your pharmacy.   If worse, please call office or go to ER/ urgent care, if not better in 2 days, please call the office

## 2019-12-30 NOTE — Patient Outreach (Signed)
[facility]) Care Management  [place]  12/30/2019   Travis [name] 29-Feb-1956 [id number]  Subjective: Telephone call to patient's home / mobile number, spoke with patient, and HIPAA verified.  Patient states he is doing okay, had an appointment with his primary provider this morning to address left hand, index finger swelling related to gout, and today will start prednisone.    States his cardiologist follow up appointment on 12/15/2019, went well, will have another follow up on 01/06/2020, and 01/13/2020.   States he will also have a follow up appointment with gastroenterologist on 01/21/2020, regarding ulcer.   Patient states he received information sent by this RNCM and does not have any questions.  States he is planning to send in his signed consent in the near future.  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  RNCM advised patient, moving to another position after 01/07/2020, patient will be transitioned to another [facility] in the future.  Patient in agreement to transition.   States he is very appreciative of the follow up and is in agreement to continue to receive [place] [facility] information / services.  States he has this RNCM's and [place] [facility] contact information, will call if assistance needed prior to next patent outreach.       Objective: Per KPN (Knowledge Performance Now, point of care tool) and chart review,patient hospitalized 11/20/2019, remains inpatient for hypotension. Patient also has a history of the following: COPD, diabetes, chronic systolic congestive heart failure, Tobacco use, alcohol use, chronic kidney disease stage 3, nonischemic cardiomyopathy,left central retinal occlusion, hypertension,hyperlipidemia, andTIA. Patient had A1C of 7.3 on 11/09/2019.     Encounter Medications:  Outpatient Encounter Medications as of 12/30/2019  Medication Sig  Note  . albuterol (PROVENTIL HFA;VENTOLIN HFA) 108 (90 BASE) MCG/ACT inhaler Inhale 2 puffs into the lungs every 6 (six) hours as needed for wheezing or shortness of breath. Take 2 puffs every 5-10 minutes up to 6 puffs total over 15 minutes when needed.  Use with a spacer. (Patient not taking: Reported on 12/30/2019)   . allopurinol (ZYLOPRIM) 100 MG tablet Take 2 tablets (200 mg total) by mouth daily. 11/20/2019: Patient took 1 tablet today  . aspirin EC 325 MG tablet Take 162.5 mg by mouth daily. 12/08/2019: Patient states he takes regular ASA, takes half in am and in pm, daily.   States MD aware.   [name] atorvastatin (LIPITOR) 20 MG tablet Take 1 tablet (20 mg total) by mouth daily.   . carvedilol (COREG) 3.125 MG tablet Take 1 tablet (3.125 mg total) by mouth 2 (two) times daily with a meal.   . cetirizine (ZYRTEC) 10 MG tablet TAKE 1 TABLET BY MOUTH DAILY (Patient taking differently: Take 10 mg by mouth daily. )   . fenofibrate (TRICOR) 145 MG tablet TAKE 1 TABLET BY MOUTH ONCE A DAY   . ferrous sulfate 325 (65 FE) MG tablet Take 1 tablet (325 mg total) by mouth daily with breakfast.   . fluticasone (FLONASE) 50 MCG/ACT nasal spray Place 1 spray into both nostrils 2 (two) times daily. (Patient taking differently: Place 1 spray into both nostrils daily as needed for allergies or rhinitis. )   . Multiple Vitamin (MULTIVITAMIN WITH MINERALS) TABS tablet Take 1 tablet by mouth daily. Men's One a Day   . Omega-3 Fatty Acids (FISH OIL PO) Take 1 capsule by mouth daily.   . pantoprazole (PROTONIX) 40  MG tablet Take 1 tablet (40 mg total) by mouth daily.   . predniSONE (DELTASONE) 10 MG tablet Take 3 tablets x 3 days, 2 tablets x 3 days, 1 tablet x 3 days   . sacubitril-valsartan (ENTRESTO) 24-26 MG Take 1 tablet by mouth 2 (two) times daily.   [name] spironolactone (ALDACTONE) 25 MG tablet Take 0.5 tablets (12.5 mg total) by mouth daily.   . traMADol (ULTRAM) 50 MG tablet Take 1 tablet (50 mg total) by mouth  every 8 (eight) hours as needed for up to 5 days.   [name] triamcinolone cream (KENALOG) 0.1 % APPLY TO AFFECTED AREAS TWICE A DAY AS NEEDED. AVOID FACE,GROIN, OR UNDERARMS    No facility-administered encounter medications on file as of 12/30/2019.    Functional Status:  In your present state of health, do you have any difficulty performing the following activities: 11/09/2019 04/16/2019  Hearing? N N  Vision? N N  Difficulty concentrating or making decisions? N N  Walking or climbing stairs? Y N  Comment secondary to dizziness and hypotension -  Dressing or bathing? N N  Doing errands, shopping? N N  Some recent data might be hidden    Fall/Depression Screening: Fall Risk  12/08/2019 11/17/2019 04/16/2019  Falls in the past year? 0 0 0  Number falls in past yr: 0 0 0  Injury with Fall? 0 0 0  Risk for fall due to : - - -  Follow up Falls evaluation completed;Education provided Falls evaluation completed Falls evaluation completed   PHQ 2/9 Scores 12/08/2019 12/30/2018 10/15/2017 07/30/2017 04/17/2017 01/15/2017 09/20/2016  PHQ - 2 Score 0 1 0 0 0 0 0  PHQ- 9 Score - 3 - - - - -    Assessment: Received Med Pay / Medicare [facility] Liaison referral on 11/26/2019. Referral source: Natividad Brood. Referral reason:  Please assign to [place] Coordinator for complex care and disease       management [Heart Failure, GIB, DM] follow up calls and assess for       further needs. His niece is also his contact person. PCP does the TOC       at [facility].  Screening follow up completed and will follow up for congestive heart failure monitoring, education, and care coordination.   Goals Addressed              This Visit's Progress   .  Patient Stated wants his heart function/ capacity to be above 80 %. (pt-stated)        CARE PLAN ENTRY (see longitudinal plan of care for additional care plan information)   Current Barriers:  [name] Knowledge  deficit related to basic heart failure pathophysiology and self care management  Case Manager Clinical Goal(s):  [name] Over the next 60 days, patient will verbalize understanding of Heart Failure Action Plan and when to call doctor . Patient will verbalize two symptoms of CHF exacerbation within the next 30 days.    Interventions:  . Provided verbal education on low sodium diet . Discussed importance of daily weight and advised patient to weigh and record daily . Discussed patients questions regarding diet   Patient Self Care Activities:  . Takes Heart Failure Medications as prescribed . Adheres to low sodium diet . Patient aware of side effects of prednisone and how it may affect his weight   Updated 12/30/2019         Plan: Newly assigned RNCM will call patient for  telephone outreach attempt within 30 business days, congestive heart failure assessment follow up, and proceed with case closure, after 4th unsuccessful outreach call.    Sitara Cashwell H. Annia Friendly, BSN, [place] [facility] Telephonic CM Phone: [phone] Fax: [phone]

## 2019-12-30 NOTE — Progress Notes (Signed)
Subjective:    Patient ID: Travis [name], male    DOB: Apr 09, 1955, 64 y.o.   MRN: [id number]  HPI Chief Complaint  Patient presents with   Hand Pain    x 3days left index finger   This is a 64 yo male who presents today with above cc.  He noticed increased pain and swelling of top of left hand about 6 days ago.  It has gotten significantly worse over the last 3 days.  He has a history of gout which usually affects his feet.  He is currently allopurinol 200 mg daily.  He has not had gout recently and does not recall any particular triggers.  He denies any injury to his left hand.  Has taken some Tylenol without improvement.  Occult he sleeping last night due to pain.   Review of Systems Per HPI    Objective:   Physical Exam Vitals reviewed.  Constitutional:      General: He is not in acute distress.    Appearance: Normal appearance. He is normal weight. He is not ill-appearing, toxic-appearing or diaphoretic.  HENT:     Head: Normocephalic and atraumatic.  Cardiovascular:     Rate and Rhythm: Normal rate.  Pulmonary:     Effort: Pulmonary effort is normal.  Musculoskeletal:     Left hand: Swelling and tenderness present. Decreased range of motion.     Right lower leg: No edema.     Left lower leg: No edema.     Comments: Left second MCP joint with obvious swelling, tenderness to palpation, no warmth or redness.  Generalized decreased range of motion of second digit due to swelling.  Skin:    General: Skin is warm and dry.  Neurological:     Mental Status: He is alert and oriented to person, place, and time.  Psychiatric:        Mood and Affect: Mood normal.        Behavior: Behavior normal.        Thought Content: Thought content normal.        Judgment: Judgment normal.       BP 116/68    Pulse (!) 103    Temp 98 F (36.7 C) (Temporal)    Ht 6\' 4"  (1.93 m)    Wt 176 lb 4 oz (79.9 kg)    SpO2 99%    BMI 21.45 kg/m  Wt Readings from Last 3 Encounters:  12/30/19  176 lb 4 oz (79.9 kg)  12/15/19 178 lb 8 oz (81 kg)  12/07/19 177 lb (80.3 kg)       Assessment & Plan:  1. Left hand pain -Possible gout flare, will treat with prednisone taper.  Will get x-ray to rule out infected joint.  Will check blood work. -Follow-up precautions reviewed with patient, call or go to [facility] if worsening pain, swelling, redness or streaking occur.  Follow-up in 48 hours if no improvement. - DG Hand Complete Left; Future - predniSONE (DELTASONE) 10 MG tablet; Take 3 tablets x 3 days, 2 tablets x 3 days, 1 tablet x 3 days  Dispense: 18 tablet; Refill: 0 - traMADol (ULTRAM) 50 MG tablet; Take 1 tablet (50 mg total) by mouth every 8 (eight) hours as needed for up to 5 days.  Dispense: 15 tablet; Refill: 0 - Uric Acid - CBC with Differential - Basic metabolic panel; Future  2. Swelling of left hand - DG Hand Complete Left; Future - predniSONE (DELTASONE) 10 MG  tablet; Take 3 tablets x 3 days, 2 tablets x 3 days, 1 tablet x 3 days  Dispense: 18 tablet; Refill: 0 - Uric Acid - CBC with Differential - Basic metabolic panel; Future  This visit occurred during the SARS-CoV-2 public health emergency.  Safety protocols were in place, including screening questions prior to the visit, additional usage of staff PPE, and extensive cleaning of exam room while observing appropriate contact time as indicated for disinfecting solutions.    Clarene Reamer, FNP-BC  [facility], [place] [place] [facility]  12/30/2019 6:38 PM

## 2019-12-31 ENCOUNTER — Other Ambulatory Visit
Admission: RE | Admit: 2019-12-31 | Discharge: 2019-12-31 | Disposition: A | Discharge status: Home / Self Care | Payer: PRIVATE HEALTH INSURANCE | Attending: Cardiovascular Disease | Admitting: Cardiovascular Disease

## 2019-12-31 ENCOUNTER — Other Ambulatory Visit (INDEPENDENT_AMBULATORY_CARE_PROVIDER_SITE_OTHER): Payer: Medicare Other

## 2019-12-31 ENCOUNTER — Other Ambulatory Visit: Payer: Self-pay

## 2019-12-31 DIAGNOSIS — M7989 Other specified soft tissue disorders: Secondary | ICD-10-CM | POA: Diagnosis not present

## 2019-12-31 DIAGNOSIS — M79642 Pain in left hand: Secondary | ICD-10-CM | POA: Diagnosis not present

## 2019-12-31 DIAGNOSIS — I5022 Chronic systolic (congestive) heart failure: Secondary | ICD-10-CM | POA: Diagnosis not present

## 2019-12-31 LAB — BASIC METABOLIC PANEL
Anion gap: 11 (ref 5–15)
BUN: 18 mg/dL (ref 6–23)
BUN: 24 mg/dL — ABNORMAL HIGH (ref 8–23)
CO2: 26 mmol/L (ref 22–32)
CO2: 27 mEq/L (ref 19–32)
Calcium: 10.2 mg/dL (ref 8.4–10.5)
Calcium: 9.9 mg/dL (ref 8.9–10.3)
Chloride: 100 mmol/L (ref 98–111)
Chloride: 99 mEq/L (ref 96–112)
Creatinine, Ser: 1.48 mg/dL — ABNORMAL HIGH (ref 0.61–1.24)
Creatinine, Ser: 1.53 mg/dL — ABNORMAL HIGH (ref 0.40–1.50)
GFR calc non Af Amer: 50 mL/min — ABNORMAL LOW (ref >60)
GFR: 47.37 mL/min — ABNORMAL LOW (ref >60.00)
Glucose, Bld: 130 mg/dL — ABNORMAL HIGH (ref 70–99)
Glucose, Bld: 190 mg/dL — ABNORMAL HIGH (ref 70–99)
Potassium: 4.5 mmol/L (ref 3.5–5.1)
Potassium: 4.8 mEq/L (ref 3.5–5.1)
Sodium: 136 mEq/L (ref 135–145)
Sodium: 137 mmol/L (ref 135–145)

## 2020-01-01 ENCOUNTER — Other Ambulatory Visit: Payer: Self-pay | Admitting: Family Medicine

## 2020-01-01 DIAGNOSIS — M25442 Effusion, left hand: Secondary | ICD-10-CM

## 2020-01-01 DIAGNOSIS — R936 Abnormal findings on diagnostic imaging of limbs: Secondary | ICD-10-CM

## 2020-01-06 ENCOUNTER — Ambulatory Visit: Payer: Medicare Other | Attending: Family | Admitting: Family

## 2020-01-06 ENCOUNTER — Other Ambulatory Visit: Payer: Self-pay

## 2020-01-06 ENCOUNTER — Other Ambulatory Visit: Payer: Self-pay | Admitting: Internal Medicine

## 2020-01-06 ENCOUNTER — Encounter: Payer: Self-pay | Admitting: Family

## 2020-01-06 ENCOUNTER — Telehealth: Payer: Self-pay

## 2020-01-06 VITALS — BP 120/79 | HR 106 | Resp 18 | Ht 76.0 in | Wt 183.0 lb

## 2020-01-06 DIAGNOSIS — I429 Cardiomyopathy, unspecified: Secondary | ICD-10-CM | POA: Diagnosis not present

## 2020-01-06 DIAGNOSIS — Z79899 Other long term (current) drug therapy: Secondary | ICD-10-CM | POA: Insufficient documentation

## 2020-01-06 DIAGNOSIS — M109 Gout, unspecified: Secondary | ICD-10-CM | POA: Diagnosis not present

## 2020-01-06 DIAGNOSIS — I10 Essential (primary) hypertension: Secondary | ICD-10-CM

## 2020-01-06 DIAGNOSIS — Z8673 Personal history of transient ischemic attack (TIA), and cerebral infarction without residual deficits: Secondary | ICD-10-CM | POA: Insufficient documentation

## 2020-01-06 DIAGNOSIS — I2510 Atherosclerotic heart disease of native coronary artery without angina pectoris: Secondary | ICD-10-CM | POA: Insufficient documentation

## 2020-01-06 DIAGNOSIS — I110 Hypertensive heart disease with heart failure: Secondary | ICD-10-CM | POA: Diagnosis present

## 2020-01-06 DIAGNOSIS — Z791 Long term (current) use of non-steroidal anti-inflammatories (NSAID): Secondary | ICD-10-CM | POA: Insufficient documentation

## 2020-01-06 DIAGNOSIS — Z789 Other specified health status: Secondary | ICD-10-CM

## 2020-01-06 DIAGNOSIS — F1020 Alcohol dependence, uncomplicated: Secondary | ICD-10-CM | POA: Diagnosis not present

## 2020-01-06 DIAGNOSIS — I5022 Chronic systolic (congestive) heart failure: Secondary | ICD-10-CM | POA: Diagnosis not present

## 2020-01-06 DIAGNOSIS — R5381 Other malaise: Secondary | ICD-10-CM | POA: Diagnosis not present

## 2020-01-06 DIAGNOSIS — J45909 Unspecified asthma, uncomplicated: Secondary | ICD-10-CM | POA: Diagnosis not present

## 2020-01-06 DIAGNOSIS — Z720 Tobacco use: Secondary | ICD-10-CM

## 2020-01-06 DIAGNOSIS — E119 Type 2 diabetes mellitus without complications: Secondary | ICD-10-CM | POA: Diagnosis not present

## 2020-01-06 DIAGNOSIS — E785 Hyperlipidemia, unspecified: Secondary | ICD-10-CM | POA: Insufficient documentation

## 2020-01-06 DIAGNOSIS — F17210 Nicotine dependence, cigarettes, uncomplicated: Secondary | ICD-10-CM | POA: Insufficient documentation

## 2020-01-06 DIAGNOSIS — Z8249 Family history of ischemic heart disease and other diseases of the circulatory system: Secondary | ICD-10-CM | POA: Insufficient documentation

## 2020-01-06 DIAGNOSIS — F1090 Alcohol use, unspecified, uncomplicated: Secondary | ICD-10-CM

## 2020-01-06 DIAGNOSIS — Z7982 Long term (current) use of aspirin: Secondary | ICD-10-CM | POA: Diagnosis not present

## 2020-01-06 DIAGNOSIS — M1990 Unspecified osteoarthritis, unspecified site: Secondary | ICD-10-CM | POA: Insufficient documentation

## 2020-01-06 DIAGNOSIS — Z7289 Other problems related to lifestyle: Secondary | ICD-10-CM

## 2020-01-06 DIAGNOSIS — Z96611 Presence of right artificial shoulder joint: Secondary | ICD-10-CM | POA: Insufficient documentation

## 2020-01-06 NOTE — Telephone Encounter (Signed)
Patient made aware of lab results with verbalized understanding. 
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
## 2020-01-06 NOTE — Patient Instructions (Addendum)
Smoking and Musculoskeletal Health Smoking is bad for your health. Most people know that smoking causes lung disease, heart disease, and cancer. But people may not realize that it also affects their bones, muscles, and joints (musculoskeletal system). When you smoke, the effects on your lungs and heart result in less oxygen for your musculoskeletal system. This can lead to poor bone and joint health. How can smoking affect my musculoskeletal health? Smoking can:  Increase your risk of having weak, thin bones (osteoporosis). Elderly smokers are at higher risk for bone fractures related to osteoporosis.  Decrease the ability of bone-forming cells to make and replace bone (in addition to reducing oxygen and blood flow).  Reduce your body's ability to absorb calcium from your diet. Less calcium means weaker bones.  Interfere with the breakdown of the male hormone estrogen. Smoking lowers estrogen, which is a hormone that helps keep bones strong. Women who smoke may have earlier menopause. Menopause is a risk factor for osteoporosis.  Weaken the tissues that attach bones to muscles (tendons). This can lead to shoulder, back, and other joint injuries.  Increase your risk of rheumatoid arthritis or make the condition worse if you already have it.  Slow down healing and increase your risk of infection and other complications if you have a bone fracture or surgery that involves your musculoskeletal system.  Make you get out of breath easily. This can keep you from getting the exercise you need to keep your bones and joints healthy.  Decrease your appetite and body mass. You may lose weight and muscle strength. This can put you at higher risk for muscle injury, joint injury, and broken bones. What actions can I take to prevent musculoskeletal problems? Quit smoking      Do not start smoking. Quit if you already do. Even stopping later in life can improve musculoskeletal health.  Do not use any  products that contain nicotine or tobacco. Do not replace cigarette smoking with e-cigarettes. The safety of e-cigarettes is not known, and some may contain harmful chemicals.  Make a plan to quit smoking and commit to it. Look for programs to help you, and ask your health care provider for recommendations and ideas.  Talk with your health care provider about using nicotine replacement medicines to help you quit, such as gum, lozenges, patches, sprays, or pills. Make other lifestyle changes   Eat a healthy diet that includes calcium and vitamin D. These nutrients are important for bone health. ? Calcium is found in dairy foods and green leafy vegetables. ? Vitamin D is found in eggs, fish, and liver. ? Many foods also have vitamin D and calcium added to them (are fortified). ? Ask your health care provider if you would benefit from taking a supplement.  Get out in the sunshine for a short time every day. This increases production of vitamin D.  Get 30 minutes of exercise at least 5 days a week. Weight-bearing and strength exercises are best for musculoskeletal health. Ask your health care provider what type of exercise is safe for you.  Do not drink alcohol if: ? Your health care provider tells you not to drink. ? You are pregnant, may be pregnant, or are planning to become pregnant.  If you drink alcohol, limit how much you have: ? 0-1 drink a day for women. ? 0-2 drinks a day for men.  Be aware of how much alcohol is in your drink. In the [place] one drink equals one 12 oz bottle   of beer (355 mL), one 5 oz glass of wine (148 mL), or one 1 oz glass of hard liquor (44 mL). Where to find more information You may find more information about smoking, musculoskeletal health, and quitting smoking from:  [place] [employer]: orthoinfo.aaos.[place], Osteoporosis and Related [place]: bones.SouthExposed.es  HelpGuide.org:  helpguide.org  https://hall.com/: smokefree.gov  American Lung Association: lung.org Contact a health care provider if:  You need help to quit smoking. Summary  When you smoke, the effects on your lungs and heart result in less oxygen for your musculoskeletal system.  Even stopping smoking later in life can improve musculoskeletal health.  Do not use any products that contain nicotine or tobacco, such as cigarettes and e-cigarettes.  If you need help quitting, ask your health care provider. This information is not intended to replace advice given to you by your health care provider. Make sure you discuss any questions you have with your health care provider. Document Revised: 12/05/2018 Document Reviewed: 07/08/2017 Elsevier Patient Education  [place] 2 of your carvedilol by mouth twice daily with meal. Will send in a new script when you need it with correct dose

## 2020-01-06 NOTE — Telephone Encounter (Signed)
-----   Message from Wellington Hampshire, MD sent at 01/01/2020 11:00 AM EDT ----- Stable renal function on Entresto.

## 2020-01-13 ENCOUNTER — Encounter: Payer: Self-pay | Admitting: Nurse Practitioner

## 2020-01-13 ENCOUNTER — Other Ambulatory Visit: Payer: Self-pay

## 2020-01-13 ENCOUNTER — Ambulatory Visit (INDEPENDENT_AMBULATORY_CARE_PROVIDER_SITE_OTHER): Payer: Medicare Other | Admitting: Nurse Practitioner

## 2020-01-13 VITALS — BP 104/66 | HR 93 | Ht 76.0 in | Wt 183.0 lb

## 2020-01-13 DIAGNOSIS — I428 Other cardiomyopathies: Secondary | ICD-10-CM | POA: Diagnosis not present

## 2020-01-13 DIAGNOSIS — D649 Anemia, unspecified: Secondary | ICD-10-CM | POA: Diagnosis not present

## 2020-01-13 DIAGNOSIS — I10 Essential (primary) hypertension: Secondary | ICD-10-CM

## 2020-01-13 DIAGNOSIS — I5022 Chronic systolic (congestive) heart failure: Secondary | ICD-10-CM | POA: Diagnosis not present

## 2020-01-13 DIAGNOSIS — R55 Syncope and collapse: Secondary | ICD-10-CM | POA: Diagnosis not present

## 2020-01-13 DIAGNOSIS — E782 Mixed hyperlipidemia: Secondary | ICD-10-CM | POA: Diagnosis not present

## 2020-01-13 DIAGNOSIS — Z720 Tobacco use: Secondary | ICD-10-CM | POA: Diagnosis not present

## 2020-01-13 NOTE — Patient Instructions (Signed)
Medication Instructions:  - Your physician recommends that you continue on your current medications as directed. Please refer to the Current Medication list given to you today.  *If you need a refill on your cardiac medications before your next appointment, please call your pharmacy*   Lab Work: - Your physician recommends that you have lab work today: BMP/ CBC  If you have labs (blood work) drawn today and your tests are completely normal, you will receive your results only by: [name] MyChart Message (if you have MyChart) OR . A paper copy in the mail If you have any lab test that is abnormal or we need to change your treatment, we will call you to review the results.   Testing/Procedures: - none ordered   Follow-Up: At [facility], you and your health needs are our priority.  As part of our continuing mission to provide you with exceptional heart care, we have created designated Provider Care Teams.  These Care Teams include your primary Cardiologist (physician) and Advanced Practice Providers (APPs -  Physician Assistants and Nurse Practitioners) who all work together to provide you with the care you need, when you need it.  We recommend signing up for the patient portal called "MyChart".  Sign up information is provided on this After Visit Summary.  MyChart is used to connect with patients for Virtual Visits (Telemedicine).  Patients are able to view lab/test results, encounter notes, upcoming appointments, etc.  Non-urgent messages can be sent to your provider as well.   To learn more about what you can do with MyChart, go to [email or web address].    Your next appointment:   4-6 week(s)  The format for your next appointment:   In Person  Provider:   You may see Kathlyn Sacramento, MD or one of the following Advanced Practice Providers on your designated Care Team:    Murray Hodgkins, NP     Other Instructions - n/a

## 2020-01-13 NOTE — Progress Notes (Signed)
Office Visit    Patient Name: Travis [name] Date of Encounter: 01/13/2020  Primary Care Provider:  [name], NP Primary Cardiologist:  Kathlyn Sacramento, MD  Chief Complaint    64 year old male with a history of alcoholic/nonischemic cardiomyopathy, HFrEF, nonobstructive CAD, TIA, tobacco abuse, hyperlipidemia, and previous excessive alcohol abuse, who presents for follow-up related to heart failure.  Past Medical History    Past Medical History:  Diagnosis Date   Alcohol abuse    Alcoholic cardiomyopathy ([place]) 03/15/2013   a. 05/2019 Echo: EF 35-40%; b. 10/2019 Ehco: EF 45-50%.   Allergy    Asthma    Central retinal artery occlusion of left eye 09/13/13   Chronic anemia    Clotting disorder ([place])    Diabetes mellitus, type 2 ([place])    pt reports his DM is gone   GI bleed    15 years ago   Gout    Hemoptysis    secondary to pulmonary edema   HFrEF (heart failure with reduced ejection fraction) ([place])    a. 12/2010 Echo: EF 20-25%; b 08/2013 Echo: EF 45-50%; c. 01/2015 Echo: EF 25-30%; d. 07/2016 Echo: EF 35-40%; e. 05/2019 Echo: EF 35-40%; d. 10/2019 Echo: EF 45-50%, mild LVH, mild red RV fxn, mildl dil RA, Triv MR. Mild to mod Ao Sclerosis w/o stenosis.   Hyperlipidemia    Hypertension    Nonischemic cardiomyopathy ([facility])    Nonobstructive Coronary artery disease    a. 01/2015 Cath: LM nl, LAD 30p/m, LCX nl, RCA 10p/m, RPDA min irregs.   Osteoarthritis    Pneumonia    TIA (transient ischemic attack)    Tobacco abuse    Vision loss    peripherial vision only left eye.[facility]  artery occusion    Past Surgical History:  Procedure Laterality Date   BIOPSY  11/25/2019   Procedure: BIOPSY;  Surgeon: Ladene Artist, MD;  Location: [facility];  Service: Endoscopy;;   CARDIAC CATHETERIZATION     CARDIAC CATHETERIZATION N/A 01/28/2015   Procedure: Left Heart Cath and Coronary Angiography;  Surgeon: Wellington Hampshire, MD;  Location: [facility];  Service: Cardiovascular;  Laterality: N/A;   COLONOSCOPY     ESOPHAGOGASTRODUODENOSCOPY (EGD) WITH PROPOFOL N/A 11/25/2019   Procedure: ESOPHAGOGASTRODUODENOSCOPY (EGD) WITH PROPOFOL;  Surgeon: Ladene Artist, MD;  Location: [facility];  Service: Endoscopy;  Laterality: N/A;   HIP ARTHROPLASTY Right 03/15/2013   Procedure: ARTHROPLASTY BIPOLAR HIP;  Surgeon: [name], MD;  Location: [facility];  Service: Orthopedics;  Laterality: Right;   TOTAL SHOULDER ARTHROPLASTY Right 09/01/2015   Procedure: RIGHT TOTAL SHOULDER ARTHROPLASTY;  Surgeon: [name], MD;  Location: [facility];  Service: Orthopedics;  Laterality: Right;    Allergies  No Known Allergies  History of Present Illness    64 year old male with above complex past medical history including alcoholic/nonischemic cardiomyopathy with EF previously as low as 20-25% in October 2012.  Other history includes HFrEF, nonobstructive LAD and RCA disease (cath 01/2015), TIA, tobacco abuse, hyperlipidemia, and previous excessive alcohol use.  Echocardiogram in March of this year showed an EF of 35-40% with global hypokinesis and grade 1 diastolic dysfunction.  He was hospitalized in August of this year with transient vision loss and dizziness.  Stroke work-up was negative.  He was noted to be anemic with a hemoglobin of 9.  Fecal occult blood was negative.  He also had a rising creatinine to 3.29.  Renal function improved with hydration  though creatinine was 2.4 at the time of discharge.  Unfortunate, he was rehospitalized at the end of August with dizziness and blurred vision and was found to be febrile and hypotensive.  Hemoglobin was down to 6.6 and he required transfusion.  Anemia work-up revealed reflux esophagitis.  [name] was discontinued in the setting of hypotension.  Fever was of unknown origin.  Renal function returned to baseline with a creatinine of 1.26.  Echocardiogram performed during hospitalization showed  improvement in EF to 45-50%.  He was last seen in clinic on September 21, at which time he was feeling well and blood pressure was stable.  Entresto 24/26 mg twice daily was resumed.  Follow-up lab work the following week showed stable creatinine of 1.48.  He was doing well following his last cardiology visit and tolerating Entresto well.  He does not check his blood pressure at home.  Yesterday, he was at a friend's house.  They had just completed wiring a dryer and while he was sitting he had sudden onset of lightheadedness with mild diaphoresis and fatigue.  This lasted about 15 minutes and resolve spontaneously.  He did not have any chest pain, dyspnea, or palpitations during this episode.  He believes that he was well hydrated and had good oral intake yesterday in general.  He has not had any recurrence of those symptoms.  He further denies any recent PND, orthopnea, syncope, edema, or early satiety.  Home Medications    Prior to Admission medications   Medication Sig Start Date End Date Taking? Authorizing Provider  albuterol (PROVENTIL HFA;VENTOLIN HFA) 108 (90 BASE) MCG/ACT inhaler Inhale 2 puffs into the lungs every 6 (six) hours as needed for wheezing or shortness of breath. Take 2 puffs every 5-10 minutes up to 6 puffs total over 15 minutes when needed.  Use with a spacer. 01/24/15   Ahmed Prima, MD  allopurinol (ZYLOPRIM) 100 MG tablet Take 2 tablets (200 mg total) by mouth daily. 01/23/19   [name], NP  aspirin EC 325 MG tablet Take 162.5 mg by mouth daily.    [provider]  atorvastatin (LIPITOR) 20 MG tablet Take 1 tablet (20 mg total) by mouth daily. 06/19/17 11/07/20  Wellington Hampshire, MD  carvedilol (COREG) 3.125 MG tablet Take 1 tablet (3.125 mg total) by mouth 2 (two) times daily with a meal. Patient taking differently: Take 6.25 mg by mouth 2 (two) times daily with a meal.  11/09/19   Shelly Coss, MD  cetirizine (ZYRTEC) 10 MG tablet TAKE 1 TABLET BY  MOUTH DAILY Patient taking differently: Take 10 mg by mouth daily.  07/22/19   [name], NP  fenofibrate (TRICOR) 145 MG tablet TAKE 1 TABLET BY MOUTH ONCE A DAY 12/23/19   [name], NP  ferrous sulfate 325 (65 FE) MG tablet Take 1 tablet (325 mg total) by mouth daily with breakfast. 11/27/19 02/25/20  Kayleen Memos, DO  fluticasone (FLONASE) 50 MCG/ACT nasal spray Place 1 spray into both nostrils 2 (two) times daily. Patient taking differently: Place 1 spray into both nostrils daily as needed for allergies or rhinitis.  03/29/16   Cuthriell, Charline Bills, PA-C  Multiple Vitamin (MULTIVITAMIN WITH MINERALS) TABS tablet Take 1 tablet by mouth daily. Men's One a Day    [provider]  Omega-3 Fatty Acids (FISH OIL PO) Take 1 capsule by mouth daily.    [provider]  pantoprazole (PROTONIX) 40 MG tablet Take 1 tablet (40 mg  total) by mouth daily. 12/11/19 03/10/20  Kayleen Memos, DO  sacubitril-valsartan (ENTRESTO) 24-26 MG Take 1 tablet by mouth 2 (two) times daily. 12/15/19   Wellington Hampshire, MD  spironolactone (ALDACTONE) 25 MG tablet Take 0.5 tablets (12.5 mg total) by mouth daily. 12/01/19 02/29/20  Darylene Price A, FNP  triamcinolone cream (KENALOG) 0.1 % APPLY TO AFFECTED AREAS TWICE A DAY AS NEEDED. [place], OR UNDERARMS 12/22/19   [name], NP    Review of Systems    Episode of presyncope with diaphoresis yesterday.  He denies chest pain, palpitations, PND, orthopnea, dyspnea, syncope, edema, or early satiety.  All other systems reviewed and are otherwise negative except as noted above.  Physical Exam    VS:  BP 104/66 (BP Location: Left Arm, Patient Position: Sitting, Cuff Size: Normal)    Pulse 93    Ht 6\' 4"  (1.93 m)    Wt 183 lb (83 kg)    BMI 22.28 kg/m  , BMI Body mass index is 22.28 kg/m. GEN: Well nourished, well developed, in no acute distress. HEENT: normal. Neck: Supple, no JVD, carotid bruits, or masses. Cardiac: RRR, no murmurs,  rubs, or gallops. No clubbing, cyanosis, edema.  Radials/PT 2+ and equal bilaterally.  Respiratory:  Respirations regular and unlabored, clear to auscultation bilaterally. GI: Soft, nontender, nondistended, BS + x 4. MS: no deformity or atrophy. Skin: warm and dry, no rash. Neuro:  Strength and sensation are intact. Psych: Normal affect.  Accessory Clinical Findings    ECG personally reviewed by me today -regular sinus rhythm, 93, left axis deviation, prolonged QT- no acute changes.  Lab Results  Component Value Date   WBC 9.1 12/30/2019   HGB 12.1 (L) 12/30/2019   HCT 35.9 (L) 12/30/2019   MCV 99.9 12/30/2019   PLT 349.0 12/30/2019   Lab Results  Component Value Date   CREATININE 1.48 (H) 12/31/2019   BUN 24 (H) 12/31/2019   NA 137 12/31/2019   K 4.5 12/31/2019   CL 100 12/31/2019   CO2 26 12/31/2019   Lab Results  Component Value Date   ALT 11 12/07/2019   AST 17 12/07/2019   ALKPHOS 47 12/07/2019   BILITOT 0.4 12/07/2019   Lab Results  Component Value Date   CHOL 150 11/09/2019   HDL 47 11/09/2019   LDLCALC 88 11/09/2019   LDLDIRECT 69.0 12/30/2018   TRIG 77 11/09/2019   CHOLHDL 3.2 11/09/2019    Lab Results  Component Value Date   HGBA1C 7.3 (H) 11/09/2019    Assessment & Plan    1.  Presyncope: Patient has been doing well since going back on Entresto last month but yesterday had an episode of presyncope that occurred while sitting.  This was associated with diaphoresis and weakness.  He did not lose consciousness and symptoms resolved within 10 to 15 minutes.  He believes that he was well-hydrated and had normal oral intake yesterday.  He does not know what his blood pressure typically runs at home and I did recommend he obtain a home blood pressure cuff.  Pressure today is 104/66.  Given history of chronic kidney disease and recent bout with anemia, I will follow-up a CBC and basic metabolic panel today.  I did recommend event monitoring to evaluate for  arrhythmias as a potential cause of presyncope however, he wishes to defer.  He has agreed to obtain a blood pressure cuff and monitor his pressure at home and contact us if he is  consistently less than 161 systolic.  2.  Chronic heart failure with improved EF/nonischemic cardiomyopathy: EF previously as low as 20 to 25% in October 2012 with improvement of 45-50% by echo in August 2021.  He is euvolemic on examination today and with the exception of what sounds like an episode of hypotension yesterday, he has been feeling well.  I am going to continue current doses of carvedilol, Entresto, and spironolactone.    3.  Essential hypertension: Blood pressure actually soft today and as above, he likely had a hypotensive episode yesterday with development of presyncope and diaphoresis times about 15 minutes.  No changes to regimen today.  I did ask patient to obtain a blood pressure cuff to be used at home.  4.  Nonobstructive CAD: No chest pain.  He remains on aspirin and statin therapy.  5.  Hyperlipidemia: LDL of 88 in August with normal LFTs in September.  Continue statin therapy.  6.  Tobacco abuse: Continues to smoke.  Cessation advised.  7.  Alcohol use: Quit after August hospitalization.  8.  Anemia: Status post transfusion during hospitalization in August.  Follow-up CBC today in the setting of weakness and presyncope yesterday.  9.  Disposition: Follow-up CBC and basic metabolic panel today.  Follow-up in clinic in 1 month or sooner if necessary.   Murray Hodgkins, NP 01/13/2020, 1:52 PM

## 2020-01-14 ENCOUNTER — Other Ambulatory Visit: Payer: Self-pay | Admitting: Internal Medicine

## 2020-01-14 LAB — CBC WITH DIFFERENTIAL/PLATELET
Basophils Absolute: 0 10*3/uL (ref 0.0–0.2)
Basos: 0 %
EOS (ABSOLUTE): 1.2 10*3/uL — ABNORMAL HIGH (ref 0.0–0.4)
Eos: 20 %
Hematocrit: 33.7 % — ABNORMAL LOW (ref 37.5–51.0)
Hemoglobin: 11.3 g/dL — ABNORMAL LOW (ref 13.0–17.7)
Immature Grans (Abs): 0 10*3/uL (ref 0.0–0.1)
Immature Granulocytes: 0 %
Lymphocytes Absolute: 1 10*3/uL (ref 0.7–3.1)
Lymphs: 16 %
MCH: 32.8 pg (ref 26.6–33.0)
MCHC: 33.5 g/dL (ref 31.5–35.7)
MCV: 98 fL — ABNORMAL HIGH (ref 79–97)
Monocytes Absolute: 0.5 10*3/uL (ref 0.1–0.9)
Monocytes: 8 %
Neutrophils Absolute: 3.5 10*3/uL (ref 1.4–7.0)
Neutrophils: 56 %
Platelets: 250 10*3/uL (ref 150–450)
RBC: 3.45 x10E6/uL — ABNORMAL LOW (ref 4.14–5.80)
RDW: 15.1 % (ref 11.6–15.4)
WBC: 6.3 10*3/uL (ref 3.4–10.8)

## 2020-01-14 LAB — BASIC METABOLIC PANEL
BUN/Creatinine Ratio: 11 (ref 10–24)
BUN: 18 mg/dL (ref 8–27)
CO2: 25 mmol/L (ref 20–29)
Calcium: 9.9 mg/dL (ref 8.6–10.2)
Chloride: 106 mmol/L (ref 96–106)
Creatinine, Ser: 1.58 mg/dL — ABNORMAL HIGH (ref 0.76–1.27)
GFR calc Af Amer: 53 mL/min/{1.73_m2} — ABNORMAL LOW (ref >59)
GFR calc non Af Amer: 46 mL/min/{1.73_m2} — ABNORMAL LOW (ref >59)
Glucose: 134 mg/dL — ABNORMAL HIGH (ref 65–99)
Potassium: 4.7 mmol/L (ref 3.5–5.2)
Sodium: 142 mmol/L (ref 134–144)

## 2020-01-21 ENCOUNTER — Ambulatory Visit: Payer: Medicare Other | Admitting: Gastroenterology

## 2020-01-21 NOTE — Telephone Encounter (Signed)
Hi Jan, pt had to cancel his appt with Dr. Havery Moros today due to lack of transportation. He is calling to r/s but Dr. Havery Moros is fully booked for the remaining of the year. He is asking if he can be squeezed in somewhere in his schedule. Pls call him at [phone].

## 2020-01-21 NOTE — Telephone Encounter (Signed)
Called and spoke to pt.  Rescheduled him for appt on 02-23-20 at 4:00pm. He understands to arrive at 3:45pm.

## 2020-01-21 NOTE — Telephone Encounter (Signed)
Can use a banding spot. Thanks

## 2020-01-27 ENCOUNTER — Ambulatory Visit: Payer: Self-pay | Admitting: *Deleted

## 2020-02-02 ENCOUNTER — Other Ambulatory Visit: Payer: Self-pay | Admitting: *Deleted

## 2020-02-02 ENCOUNTER — Encounter: Payer: Self-pay | Admitting: *Deleted

## 2020-02-02 NOTE — Patient Instructions (Signed)
Goals Addressed              This Visit's Progress   .  COMPLETED: Patient Stated wants his heart function/ capacity to be above 80 %. (pt-stated)        CARE PLAN ENTRY (see longitudinal plan of care for additional care plan information)   Current Barriers:  [name] Knowledge deficit related to basic heart failure pathophysiology and self care management  Case Manager Clinical Goal(s):  [name] Over the next 60 days, patient will verbalize understanding of Heart Failure Action Plan and when to call doctor . Patient will verbalize two symptoms of CHF exacerbation within the next 30 days.    Interventions:  . Provided verbal education on low sodium diet . Discussed importance of daily weight and advised patient to weigh and record daily . Discussed patients questions regarding diet   Patient Self Care Activities:  . Takes Heart Failure Medications as prescribed . Adheres to low sodium diet . Patient aware of side effects of prednisone and how it may affect his weight   Updated 12/30/2019    02/02/20: resolved due to duplication of goals     .  THN CM: Find Help in My Community        Follow Up Date 03/03/20   - begin a notebook of services in my neighborhood or community - call 211 when I need some help - follow-up on any referrals for help I am given - think ahead to make sure my need does not become an emergency - have a back-up plan - make a list of family or friends that I can call    Why is this important?   Knowing how and where to find help for yourself or family in your neighborhood and community is an important skill.  You will want to take some steps to learn how.    Notes:   02/02/20: [place] work referral placed to discuss potential transportation needs due to reported car issues    .  THN CM: Make and Keep All Appointments        Follow Up Date 03/03/20   - ask family or friend for a ride - keep a calendar with appointment dates - learn the bus  route - use public transportation    Why is this important?   Part of staying healthy is seeing the doctor for follow-up care.  If you forget your appointments, there are some things you can do to stay on track.    Notes:   02/02/20: Recent and upcoming provider appointments reviewed with patient; confirmed patient aware of all and has plans to attend all as scheduled    .  THN CM: [place]ain My Quality of Life        Follow Up Date 03/03/20   - discuss my treatment options with the doctor or nurse - make shared treatment decisions with doctor    Why is this important?   Having a long-term illness can be scary.  It can also be stressful for you and your caregiver.  These steps may help.    Notes:     .  THN CM: Protect My Health        Follow Up Date 03/03/20    - schedule appointment for vaccines needed due to my age or health - schedule and keep appointment for annual check-up    Why is this important?   Screening tests can find diseases early when they are  easier to treat.  Your doctor or nurse will talk with you about which tests are important for you.  Getting shots for common diseases like the flu and shingles will help prevent them.     Notes:   02/02/20: Please continue efforts to quit smoking    .  THN CM: Track and Manage Fluids and Swelling        Follow Up Date 03/03/20   - call office if I gain more than 2 pounds in one day or 5 pounds in one week - do ankle pumps when sitting - keep legs up while sitting - track weight in diary - use salt in moderation - watch for swelling in feet, ankles and legs every day - weigh myself daily    Why is this important?   It is important to check your weight daily and watch how much salt and liquids you have.  It will help you to manage your heart failure.    Notes:   02/02/20: Please start monitoring/ recording daily weights at home to stay on top of any fluid accumulation: daily weights at home are the best way  to monitor if you are retaining fluid; weight gain is the earliest indicator that you have started retaining fluid    .  THN CM: Track and Manage Symptoms        Follow Up Date 03/03/20    - develop a rescue plan - eat more whole grains, fruits and vegetables, lean meats and healthy fats - follow rescue plan if symptoms flare-up - know when to call the doctor - track symptoms and what helps feel better or worse - dress right for the weather, hot or cold    Why is this important?   You will be able to handle your symptoms better if you keep track of them.  Making some simple changes to your lifestyle will help.  Eating healthy is one thing you can do to take good care of yourself.    Notes:   02/02/20: Contact your care providers/ doctors promptly if you believe your condition is worsening        Smoking Tobacco Information, Adult Smoking tobacco can be harmful to your health. Tobacco contains a poisonous (toxic), colorless chemical called nicotine. Nicotine is addictive. It changes the brain and can make it hard to stop smoking. Tobacco also has other toxic chemicals that can hurt your body and raise your risk of many cancers. How can smoking tobacco affect me? Smoking tobacco puts you at risk for:  Cancer. Smoking is most commonly associated with lung cancer, but can also lead to cancer in other parts of the body.  Chronic obstructive pulmonary disease (COPD). This is a long-term lung condition that makes it hard to breathe. It also gets worse over time.  High blood pressure (hypertension), heart disease, stroke, or heart attack.  Lung infections, such as pneumonia.  Cataracts. This is when the lenses in the eyes become clouded.  Digestive problems. This may include peptic ulcers, heartburn, and gastroesophageal reflux disease (GERD).  Oral health problems, such as gum disease and tooth loss.  Loss of taste and smell. Smoking can affect your appearance by  causing:  Wrinkles.  Yellow or stained teeth, fingers, and fingernails. Smoking tobacco can also affect your social life, because:  It may be challenging to find places to smoke when away from home. Many workplaces, [employer], hotels, and public places are tobacco-free.  Smoking is expensive. This is due to  the cost of tobacco and the long-term costs of treating health problems from smoking.  Secondhand smoke may affect those around you. Secondhand smoke can cause lung cancer, breathing problems, and heart disease. Children of smokers have a higher risk for: ? Sudden infant death syndrome (SIDS). ? Ear infections. ? Lung infections. If you currently smoke tobacco, quitting now can help you:  Lead a longer and healthier life.  Look, smell, breathe, and feel better over time.  Save money.  Protect others from the harms of secondhand smoke. What actions can I take to prevent health problems? Quit smoking   Do not start smoking. Quit if you already do.  Make a plan to quit smoking and commit to it. Look for programs to help you and ask your health care provider for recommendations and ideas.  Set a date and write down all the reasons you want to quit.  Let your friends and family know you are quitting so they can help and support you. Consider finding friends who also want to quit. It can be easier to quit with someone else, so that you can support each other.  Talk with your health care provider about using nicotine replacement medicines to help you quit, such as gum, lozenges, patches, sprays, or pills.  Do not replace cigarette smoking with electronic cigarettes, which are commonly called e-cigarettes. The safety of e-cigarettes is not known, and some may contain harmful chemicals.  If you try to quit but return to smoking, stay positive. It is common to slip up when you first quit, so take it one day at a time.  Be prepared for cravings. When you feel the urge to smoke,  chew gum or suck on hard candy. Lifestyle  Stay busy and take care of your body.  Drink enough fluid to keep your urine pale yellow.  Get plenty of exercise and eat a healthy diet. This can help prevent weight gain after quitting.  Monitor your eating habits. Quitting smoking can cause you to have a larger appetite than when you smoke.  Find ways to relax. Go out with friends or family to a movie or a restaurant where people do not smoke.  Ask your health care provider about having regular tests (screenings) to check for cancer. This may include blood tests, imaging tests, and other tests.  Find ways to manage your stress, such as meditation, yoga, or exercise. Where to find support To get support to quit smoking, consider:  Asking your health care provider for more information and resources.  Taking classes to learn more about quitting smoking.  Looking for local organizations that offer resources about quitting smoking.  Joining a support group for people who want to quit smoking in your local community.  Calling the smokefree.gov counselor helpline: 1-800-Quit-Now 905-157-8831) Where to find more information You may find more information about quitting smoking from:  HelpGuide.org: www.helpguide.org  https://hall.com/: smokefree.gov  American Lung Association: www.lung.org Contact a health care provider if you:  Have problems breathing.  Notice that your lips, nose, or fingers turn blue.  Have chest pain.  Are coughing up blood.  Feel faint or you pass out.  Have other health changes that cause you to worry. Summary  Smoking tobacco can negatively affect your health, the health of those around you, your finances, and your social life.  Do not start smoking. Quit if you already do. If you need help quitting, ask your health care provider.  Think about joining a support group for  people who want to quit smoking in your local community. There are many effective  programs that will help you to quit this behavior. This information is not intended to replace advice given to you by your health care provider. Make sure you discuss any questions you have with your health care provider. Document Revised: 12/05/2018 Document Reviewed: 03/27/2016 Elsevier Patient Education  2020 [place] With Heart Failure  Heart failure is a long-term (chronic) condition in which the heart cannot pump enough blood through the body. When this happens, parts of the body do not get the blood and oxygen they need. There is no cure for heart failure at this time, so it is important for you to take good care of yourself and follow the treatment plan set by your health care provider. If you are living with heart failure, there are ways to help you manage the disease. Follow these instructions at home: Living with heart failure requires you to make changes in your life. Your health care team will teach you about the changes you need to make in order to relieve your symptoms and lower your risk of going to the hospital. Follow the treatment plan as set by your health care provider. Medicines Medicines are important in reducing your heart's workload, slowing the progression of heart failure, and improving your symptoms.  Take over-the-counter and prescription medicines only as told by your health care provider.  Do not stop taking your medicine unless your health care provider tells you to do that.  Do not skip any dose of your medicine.  Refill prescriptions before you run out of medicine. You need your medicines every day. Eating and drinking   Eat heart-healthy foods. Talk with a dietitian to make an eating plan that is right for you. ? If directed by your health care provider:  Limit salt (sodium). Lowering your sodium intake may reduce symptoms of heart failure. Ask a dietitian to recommend heart-healthy seasonings.  Limit your fluid intake. Fluid restriction  may reduce symptoms of heart failure. ? Use low-fat cooking methods instead of frying. Low-fat methods include roasting, grilling, broiling, baking, poaching, steaming, and stir-frying. ? Choose foods that contain no trans fat and are low in saturated fat and cholesterol. Healthy choices include fresh or frozen fruits and vegetables, fish, lean meats, legumes, fat-free or low-fat dairy products, and whole-grain or high-fiber foods.  Limit alcohol intake to no more than 1 drink a day for nonpregnant women and 2 drinks a day for men. One drink equals 12 oz of beer, 5 oz of wine, or 1 oz of hard liquor. ? Drinking more than that is harmful to your heart. Tell your health care provider if you drink alcohol several times a week. ? Talk with your health care provider about whether any level of alcohol use is safe for you. Activity   Ask your health care provider about attending cardiac rehabilitation. These programs include aerobic physical activity, which provides many benefits for your heart.  If no cardiac rehabilitation program is available, ask your health care provider what aerobic exercises are safe for you to do. Lifestyle Make the lifestyle changes recommended by your health care provider. In general:  Lose weight if your health care provider tells you to do that. Weight loss may reduce symptoms of heart failure.  Do not use any products that contain nicotine or tobacco, such as cigarettes or e-cigarettes. If you need help quitting, ask your health care provider.  Do not  use street (illegal) drugs.  Return to your normal activities as told by your health care provider. Ask your health care provider what activities are safe for you. General instructions   Make sure you weigh yourself every day to track your weight. Rapid weight gain may indicate an increase in fluid in your body and may increase the workload of your heart. ? Weigh yourself every morning. Do this after you urinate but  before you eat breakfast. ? Wear the same type of clothing, without shoes, each time you weigh yourself. ? Weigh yourself on the same scale and in the same spot each time.  Living with chronic heart failure often leads to emotions such as fear, stress, anxiety, and depression. If you feel any of these emotions and need help coping, contact your health care provider. Other ways to get help include: ? Talking to friends and family members about your condition. They can give you support and guidance. Explain your symptoms to them and, if comfortable, invite them to attend appointments or rehabilitation with you. ? Joining a support group for people with chronic heart failure. Talking with other people who have the same symptoms may give you new ways of coping with your disease and your emotions.  Stay up to date with your shots (vaccines). Staying current on pneumococcal and influenza vaccines is especially important in preventing germs from attacking your airways (respiratory infections).  Keep all follow-up visits as told by your health care provider. This is important. How to recognize changes in your condition You and your family members need to know what changes to watch for in your condition. Watch for the following changes and report them to your health care provider:  Sudden weight gain. Ask your health care provider what amount of weight gain to report.  Shortness of breath: ? Feeling short of breath while at rest, with no exercise or activity that required great effort. ? Feeling breathless with activity.  Swelling of your lower legs or ankles.  Difficulty sleeping: ? You wake up feeling short of breath. ? You have to use more pillows to raise your head in order to sleep.  Frequent, dry, hacking cough.  Loss of appetite.  Feeling more tired all the time.  Depression or feelings of sadness or hopelessness.  Bloating in the stomach. Where to find more information  Local  support groups. Ask your health care provider about groups near you.  The American Heart Association: www.heart.org Contact a health care provider if:  You have a rapid weight gain.  You have increasing shortness of breath that is unusual for you.  You are unable to participate in your usual physical activities.  You tire easily.  You cough more than normal, especially with physical activity.  You have any swelling or more swelling in areas such as your hands, feet, ankles, or abdomen.  You feel like your heart is beating quickly (palpitations).  You become dizzy or light-headed when you stand up. Get help right away if:  You have difficulty breathing.  You notice or your family notices a change in your awareness, such as having trouble staying awake or having difficulty with concentration.  You have pain or discomfort in your chest.  You have an episode of fainting (syncope). Summary  There is no cure for heart failure, so it is important for you to take good care of yourself and follow the treatment plan set by your health care provider.  Medicines are important in reducing your heart's  workload, slowing the progression of heart failure, and improving your symptoms.  Living with chronic heart failure often leads to emotions such as fear, stress, anxiety, and depression. If you are feeling any of these emotions and need help coping, contact your health care provider. This information is not intended to replace advice given to you by your health care provider. Make sure you discuss any questions you have with your health care provider. Document Revised: 02/22/2017 Document Reviewed: 07/25/2016 Elsevier Patient Education  2020 [place], RN, BSN, [employer] Coordinator [facility] Management  [phone]

## 2020-02-02 NOTE — Patient Outreach (Signed)
[facility]) Care Management THN CM Telephone Outreach, new referral- transfer from previous [place]  02/02/2020  LEXX MONTE 05/07/1955 [id number]  Successful outreach attempt to Travis [name], referred to this [facility] RN CM 01/26/20 on re-assignment from previous [place]; original referral received 11/26/19 by [facility] Liaison after patient experienced hospital admission August 27- November 26, 2019 for sepsis/ hypotension/ anemia.  Patient was discharged home to self-care without home health services in place.  Patient has history including, but not limited to, non-ischemic cardiomyopathy; CHF; CKD-III; HTN/ HLD; DM-II (A1-C 7.3 on 11/09/19); previous TIA; tobacco and ETOH use.  HIPAA/ identity verified, [facility] CM services, introduction of new THN RN CM completed.  Patient reports "doing fine," and denies clinical concerns.  Self-manages medications, reports has not needed rescue inhaler recently.  Reports he continues to weigh himself at home "once or twice a week."  Discussed/ reiterated previously provided education around rationale for daily weight monitoring/ recording as a guide to corresponding action plan; patient reports he will "try to" start being more consistent with daily weight monitoring at home; today he reports last week's weight at "186 lbs;" and while we were on the phone, he weighed today and reports "178.6 lbs."  Has not yet obtained automatic blood pressure cuff as recently recommended by cardiology provider, verbalizes plans to obtain soon-- he was advised to also record BP measurements at home with each time he monitors.  Reports he has not been drinking alcohol since his last hospitalization; continues to smoke "about 1 ppd;" cessation strategies encouraged.  Recent/ upcoming provider appointments reviewed with patient who verbalizes plans to attend all as scheduled; patient describes recent issue with car, states he is no longer driving self due to these  issues; reports plans to have friend transport while his car is being fixed.  Discussed option of [facility]N CSW to contact him for resources available in his area- he is agreeable to this; will place [facility] CSW referral today.  Patient denies further issues, concerns, or problems today.  I provided/ confirmed that patient has my direct phone number, the main THN CM office phone number, and the [facility] CM 24-hour nurse advice phone number should issues arise prior to next scheduled THN CM outreach next month, post-upcoming scheduled provider appointments.  Encouraged patient to contact me directly if needs, questions, issues, or concerns arise prior to next scheduled outreach; patient agreed to do so.  Plan:  Patient will take medications as prescribed and will attend all scheduled provider appointments  Patient will promptly notify care providers for any new concerns/ issues/ problems that arise  Patient will begin monitoring/ recording daily weights and blood pressures  THN CM outreach to continue with scheduled phone call next month, unless indicated sooner  Oneta Rack, RN, BSN, [place] Care Management  [phone]

## 2020-02-02 NOTE — Patient Outreach (Signed)
[facility]) Care Management  02/02/2020  Travis [name] [birth year] [id number]   CSW was able to make initial contact with patient today to perform phone assessment, as well as assess and assist with social work needs and services.  CSW introduced self, explained role and types of services provided through [place] [facility] ([place] [facility]).  CSW further explained to patient that CSW works with patient's RNCM, also with [place] [facility], Reginia Naas.  CSW then explained the reason for the call, indicating that Mrs. Tousey thought that patient would benefit from social work services and resources to assist with arranging transportation to and from physician appointments.  CSW obtained two HIPAA compliant identifiers from patient, which included patient's name and date of birth.  Patient denied needing transportation assistance at present, admitting that he really only wanted a list of resources for future reference.  CSW reminded patient of his transportation benefit through [place], providing him with the contact information for when he is ready to place a referral.  CSW will perform a case closure on patient, as all goals of treatment have been met from social work standpoint and no additional social work needs have been identified at this time.  CSW will notify Mrs. Tousey of CSW's plans to close patient's case.  CSW will fax an update to patient's Primary Care Physician, Dr. [name] to ensure that she is aware of CSW's involvement with patient's plan of care and send a Physician Case Closure Letter.    Nat Christen, BSW, MSW, LCSW  Licensed Education officer, [facility]  Mailing [place] [address], [place], [place] [address] Physical Address-[address] [place], [place] [address] Toll Free Main # [phone] Fax # [phone] Cell #  [phone]  [email or web address]

## 2020-02-09 ENCOUNTER — Other Ambulatory Visit: Payer: Self-pay

## 2020-02-09 ENCOUNTER — Encounter: Payer: Self-pay | Admitting: Family

## 2020-02-09 ENCOUNTER — Ambulatory Visit: Payer: Medicare Other | Attending: Family | Admitting: Family

## 2020-02-09 VITALS — BP 126/78 | HR 85 | Resp 20 | Ht 76.0 in | Wt 184.1 lb

## 2020-02-09 DIAGNOSIS — Z789 Other specified health status: Secondary | ICD-10-CM

## 2020-02-09 DIAGNOSIS — J45909 Unspecified asthma, uncomplicated: Secondary | ICD-10-CM | POA: Insufficient documentation

## 2020-02-09 DIAGNOSIS — I110 Hypertensive heart disease with heart failure: Secondary | ICD-10-CM | POA: Insufficient documentation

## 2020-02-09 DIAGNOSIS — F17210 Nicotine dependence, cigarettes, uncomplicated: Secondary | ICD-10-CM | POA: Insufficient documentation

## 2020-02-09 DIAGNOSIS — F1020 Alcohol dependence, uncomplicated: Secondary | ICD-10-CM | POA: Insufficient documentation

## 2020-02-09 DIAGNOSIS — I5022 Chronic systolic (congestive) heart failure: Secondary | ICD-10-CM | POA: Insufficient documentation

## 2020-02-09 DIAGNOSIS — Z8249 Family history of ischemic heart disease and other diseases of the circulatory system: Secondary | ICD-10-CM | POA: Diagnosis not present

## 2020-02-09 DIAGNOSIS — I10 Essential (primary) hypertension: Secondary | ICD-10-CM

## 2020-02-09 DIAGNOSIS — E119 Type 2 diabetes mellitus without complications: Secondary | ICD-10-CM | POA: Insufficient documentation

## 2020-02-09 DIAGNOSIS — M1990 Unspecified osteoarthritis, unspecified site: Secondary | ICD-10-CM | POA: Insufficient documentation

## 2020-02-09 DIAGNOSIS — I2510 Atherosclerotic heart disease of native coronary artery without angina pectoris: Secondary | ICD-10-CM | POA: Diagnosis not present

## 2020-02-09 DIAGNOSIS — Z79899 Other long term (current) drug therapy: Secondary | ICD-10-CM | POA: Insufficient documentation

## 2020-02-09 DIAGNOSIS — Z8673 Personal history of transient ischemic attack (TIA), and cerebral infarction without residual deficits: Secondary | ICD-10-CM | POA: Insufficient documentation

## 2020-02-09 DIAGNOSIS — Z7982 Long term (current) use of aspirin: Secondary | ICD-10-CM | POA: Diagnosis not present

## 2020-02-09 DIAGNOSIS — M109 Gout, unspecified: Secondary | ICD-10-CM | POA: Insufficient documentation

## 2020-02-09 DIAGNOSIS — Z7289 Other problems related to lifestyle: Secondary | ICD-10-CM

## 2020-02-09 DIAGNOSIS — Z833 Family history of diabetes mellitus: Secondary | ICD-10-CM | POA: Diagnosis not present

## 2020-02-09 DIAGNOSIS — E785 Hyperlipidemia, unspecified: Secondary | ICD-10-CM | POA: Diagnosis not present

## 2020-02-09 DIAGNOSIS — Z720 Tobacco use: Secondary | ICD-10-CM

## 2020-02-09 MED ORDER — ATORVASTATIN CALCIUM 20 MG PO TABS: 20.0000 mg | ORAL_TABLET | Freq: Every day | ORAL | 3 refills | Status: DC

## 2020-02-09 NOTE — Patient Instructions (Signed)
Continue weighing daily and call for an overnight weight gain of > 2 pounds or a weekly weight gain of >5 pounds.   Finish taking entresto 24/26mg . Once you finish that bottle, you can break the 97/103mg  tablets in half and take 1/2 tablet twice a day

## 2020-02-09 NOTE — Progress Notes (Signed)
Patient ID: Travis [name], male    DOB: [birth year], 64 y.o.   MRN: [id number]  HPI  [name] is a 64 y/o male with a history of osteoarthritis, hyperlipidemia, gout, HTN, GI bleed, DM, CAD, anemia, asthma, previous alcohol use, current tobacco use and chronic heart failure.  Echo report from 11/09/19 reviewed and showed an EF of 45-50% along with mild LVH. Echo report from 06/04/19 reviewed and showed an EF of 35-40% along with mild LVH. Reviewed last echo done 08/08/16 which showed an EF of 35-40%. This is an improvement from his last echo which was done 01/26/15 and showed an EF of 25-30% along with trivial MR/ TR. EF had dropped from 2015 when it was 45-50%.   Had a cardiac catheterization done 01/28/15 which showed 10% stenosis in Prox RCA to Mid RCA and 30% stenosis in Prox LAD to Mid LAD which is not much different from previous angiography. Cardiomyopathy felt to be due to previous alcohol use.   Patient was admitted to the hospital on 11/20/19 and was discharged on 11/26/19 after receiving IV antibiotics and sepsis workup.  for Sepsis Was in the ED 11/08/19 due to scapula pain along with short-term vision loss. Evaluated and released the next day.   He presents today for a follow-up visit with a chief complaint of back pain. Patient reports this is chronic in nature and has been present for several years. 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 Patient denies N/V/D, denies chest pain, shortness of breath or swelling.   Past Medical History:  Diagnosis Date  . Alcohol abuse   . 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 05/2019 Echo: EF 35-40%; b. 10/2019 Ehco: EF 45-50%.  . Allergy   . Asthma   . 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 Chronic anemia   . Clotting disorder (Travis [name])   . Diabetes mellitus, type 2 (Travis [name])    pt reports his DM is gone  . GI bleed    15 years ago  . Gout    . Hemoptysis    secondary to pulmonary edema  . HFrEF (heart failure with reduced ejection fraction) (Travis [name])    a. 12/2010 Echo: EF 20-25%; b 08/2013 Echo: EF 45-50%; c. 01/2015 Echo: EF 25-30%; d. 07/2016 Echo: EF 35-40%; e. 05/2019 Echo: EF 35-40%; d. 10/2019 Echo: EF 45-50%, mild LVH, mild red RV fxn, mildl dil RA, Triv MR. Mild to mod Ao Sclerosis w/o stenosis.  . Hyperlipidemia   . Hypertension   . Nonischemic cardiomyopathy (Travis [name])   . Nonobstructive Coronary artery disease    a. 01/2015 Cath: LM nl, LAD 30p/m, LCX nl, RCA 10p/m, RPDA min irregs.  . Osteoarthritis   . Pneumonia   . TIA (transient ischemic attack)   . Tobacco abuse   . Vision loss    peripherial vision only left eye.Centra Retinla  artery occusion    Past Surgical History:  Procedure Laterality Date  . BIOPSY  11/25/2019   Procedure: BIOPSY;  Surgeon: Ladene Artist, MD;  Location: [place];  Service: Endoscopy;;  . CARDIAC CATHETERIZATION    . CARDIAC CATHETERIZATION N/A 01/28/2015   Procedure: Left Heart Cath and Coronary Angiography;  Surgeon: Wellington Hampshire, MD;  Location: [place] [facility];  Service: Cardiovascular;  Laterality: N/A;  . COLONOSCOPY    . ESOPHAGOGASTRODUODENOSCOPY (EGD) WITH PROPOFOL N/A 11/25/2019  Procedure: ESOPHAGOGASTRODUODENOSCOPY (EGD) WITH PROPOFOL;  Surgeon: Ladene Artist, MD;  Location: [facility] ENDOSCOPY;  Service: Endoscopy;  Laterality: N/A;  . HIP ARTHROPLASTY Right 03/15/2013   Procedure: ARTHROPLASTY BIPOLAR HIP;  Surgeon: [name], MD;  Location: [place];  Service: Orthopedics;  Laterality: Right;  . TOTAL SHOULDER ARTHROPLASTY Right 09/01/2015   Procedure: RIGHT TOTAL SHOULDER ARTHROPLASTY;  Surgeon: [name], MD;  Location: [place];  Service: Orthopedics;  Laterality: Right;   Family History  Problem Relation Age of Onset  . Diabetes Mother   . Hypertension Mother   . Diabetes Father   . Hypertension Father   . Diabetes Sister   . Dementia Brother   .  Cancer Neg Hx   . Heart disease Neg Hx   . Stroke Neg Hx   . Colon cancer Neg Hx   . Esophageal cancer Neg Hx   . Rectal cancer Neg Hx   . Stomach cancer Neg Hx    Social History   Tobacco Use  . Smoking status: Current Every Day Smoker    Packs/day: 0.50    Years: 30.00    Pack years: 15.00    Types: Cigarettes  . Smokeless tobacco: Never Used  Substance Use Topics  . Alcohol use: Yes    Alcohol/week: 1.0 standard drink    Types: 1 Glasses of wine per week    Comment: occasionally   No Known Allergies Past Medical History:  Diagnosis Date  . Alcohol abuse   . Alcoholic cardiomyopathy ([place]) 03/15/2013   a. 05/2019 Echo: EF 35-40%; b. 10/2019 Ehco: EF 45-50%.  . Allergy   . Asthma   . 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 Chronic anemia   . Clotting disorder (Travis [name])   . Diabetes mellitus, type 2 ([place])    pt reports his DM is gone  . GI bleed    15 years ago  . Gout   . Hemoptysis    secondary to pulmonary edema  . HFrEF (heart failure with reduced ejection fraction) (Travis [name])    a. 12/2010 Echo: EF 20-25%; b 08/2013 Echo: EF 45-50%; c. 01/2015 Echo: EF 25-30%; d. 07/2016 Echo: EF 35-40%; e. 05/2019 Echo: EF 35-40%; d. 10/2019 Echo: EF 45-50%, mild LVH, mild red RV fxn, mildl dil RA, Triv MR. Mild to mod Ao Sclerosis w/o stenosis.  . Hyperlipidemia   . Hypertension   . Nonischemic cardiomyopathy (Travis [name])   . Nonobstructive Coronary artery disease    a. 01/2015 Cath: LM nl, LAD 30p/m, LCX nl, RCA 10p/m, RPDA min irregs.  . Osteoarthritis   . Pneumonia   . TIA (transient ischemic attack)   . Tobacco abuse   . Vision loss    peripherial vision only left eye.Centra Retinla  artery occusion    Past Surgical History:  Procedure Laterality Date  . BIOPSY  11/25/2019   Procedure: BIOPSY;  Surgeon: Ladene Artist, MD;  Location: [place];  Service: Endoscopy;;  . CARDIAC CATHETERIZATION    . CARDIAC CATHETERIZATION N/A 01/28/2015   Procedure: Left Heart Cath  and Coronary Angiography;  Surgeon: Wellington Hampshire, MD;  Location: [place] [facility];  Service: Cardiovascular;  Laterality: N/A;  . COLONOSCOPY    . ESOPHAGOGASTRODUODENOSCOPY (EGD) WITH PROPOFOL N/A 11/25/2019   Procedure: ESOPHAGOGASTRODUODENOSCOPY (EGD) WITH PROPOFOL;  Surgeon: Ladene Artist, MD;  Location: [facility] ENDOSCOPY;  Service: Endoscopy;  Laterality: N/A;  . HIP ARTHROPLASTY Right 03/15/2013   Procedure: ARTHROPLASTY BIPOLAR HIP;  Surgeon: Harrell Gave  Kerry Fort, MD;  Location: [place];  Service: Orthopedics;  Laterality: Right;  . TOTAL SHOULDER ARTHROPLASTY Right 09/01/2015   Procedure: RIGHT TOTAL SHOULDER ARTHROPLASTY;  Surgeon: [name], MD;  Location: [place];  Service: Orthopedics;  Laterality: Right;   Family History  Problem Relation Age of Onset  . Diabetes Mother   . Hypertension Mother   . Diabetes Father   . Hypertension Father   . Diabetes Sister   . Dementia Brother   . Cancer Neg Hx   . Heart disease Neg Hx   . Stroke Neg Hx   . Colon cancer Neg Hx   . Esophageal cancer Neg Hx   . Rectal cancer Neg Hx   . Stomach cancer Neg Hx    Social History   Tobacco Use  . Smoking status: Current Every Day Smoker    Packs/day: 0.50    Years: 30.00    Pack years: 15.00    Types: Cigarettes  . Smokeless tobacco: Never Used  Substance Use Topics  . Alcohol use: Yes    Alcohol/week: 1.0 standard drink    Types: 1 Glasses of wine per week    Comment: occasionally   No Known Allergies  Prior to Admission medications   Medication Sig Start Date End Date Taking? Authorizing Provider  albuterol (PROVENTIL HFA;VENTOLIN HFA) 108 (90 BASE) MCG/ACT inhaler Inhale 2 puffs into the lungs every 6 (six) hours as needed for wheezing or shortness of breath. Take 2 puffs every 5-10 minutes up to 6 puffs total over 15 minutes when needed.  Use with a spacer. 01/24/15  Yes Ahmed Prima, MD  allopurinol (ZYLOPRIM) 100 MG tablet Take 2 tablets (200 mg total) by mouth daily.  01/23/19  Yes [name], NP  aspirin EC 325 MG tablet Take 162.5 mg by mouth daily.   Yes [provider]  carvedilol (COREG) 3.125 MG tablet Take 1 tablet (3.125 mg total) by mouth 2 (two) times daily with a meal. Patient taking differently: Take 6.25 mg by mouth 2 (two) times daily with a meal.  11/09/19  Yes Adhikari, Amrit, MD  cetirizine (ZYRTEC) 10 MG tablet TAKE 1 TABLET BY MOUTH DAILY Patient taking differently: Take 10 mg by mouth daily.  07/22/19  Yes Baity, Coralie Keens, NP  fenofibrate (TRICOR) 145 MG tablet TAKE 1 TABLET BY MOUTH ONCE A DAY 12/23/19  Yes [name], NP  ferrous sulfate 325 (65 FE) MG tablet Take 1 tablet (325 mg total) by mouth daily with breakfast. 11/27/19 02/25/20 Yes Hall, Carole N, DO  fluticasone (FLONASE) 50 MCG/ACT nasal spray [name] 1 spray into both nostrils 2 (two) times daily. Patient taking differently: [name] 1 spray into both nostrils daily as needed for allergies or rhinitis.  03/29/16  Yes Cuthriell, Charline Bills, PA-C  Multiple Vitamin (MULTIVITAMIN WITH MINERALS) TABS tablet Take 1 tablet by mouth daily. Men's One a Day   Yes [provider]  Omega-3 Fatty Acids (FISH OIL PO) Take 1 capsule by mouth daily.   Yes [provider]  pantoprazole (PROTONIX) 40 MG tablet Take 1 tablet (40 mg total) by mouth daily. 12/11/19 03/10/20 Yes Hall, Carole N, DO  sacubitril-valsartan (ENTRESTO) 24-26 MG Take 1 tablet by mouth 2 (two) times daily. 12/15/19  Yes Wellington Hampshire, MD  spironolactone (ALDACTONE) 25 MG tablet Take 0.5 tablets (12.5 mg total) by mouth daily. 12/01/19 02/29/20 Yes Hackney, Otila Kluver A, FNP  triamcinolone cream (KENALOG) 0.1 % APPLY TO AFFECTED AREAS TWICE  A DAY AS NEEDED. AVOID FACE,GROIN, OR UNDERARMS 01/14/20  Yes Baity, Coralie Keens, NP  atorvastatin (LIPITOR) 20 MG tablet Take 1 tablet (20 mg total) by mouth daily. Patient not taking: Reported on 02/09/2020 06/19/17 11/07/20  Wellington Hampshire, MD     Review of Systems   Constitutional: Negative for appetite change and fatigue.  HENT: Negative for congestion, postnasal drip and sore throat.   Eyes: Negative.   Respiratory: Negative for cough, chest tightness and shortness of breath.   Cardiovascular: Negative for chest pain, palpitations and leg swelling.  Gastrointestinal: Negative for abdominal distention and abdominal pain.  Endocrine: Negative.   Genitourinary: Negative.   Musculoskeletal: Positive for back pain (Using lidocaine patches for pain). Negative for neck pain.  Skin: Negative.   Allergic/Immunologic: Negative.   Neurological: Negative for dizziness, light-headedness and headaches.  Hematological: Negative for adenopathy. Does not bruise/bleed easily.  Psychiatric/Behavioral: Positive for sleep disturbance (sleeping on 3 pillows). Negative for dysphoric mood and suicidal ideas. The patient is not nervous/anxious.    Vitals with BMI 02/09/2020 01/13/2020 01/06/2020  Height 6\' 4"  6\' 4"  6\' 4"   Weight 184 lbs 2 oz 183 lbs 183 lbs  BMI 22.42 35.57 32.20  Systolic 254 270 623  Diastolic 78 66 79  Pulse 85 93 106    Lab Results  Component Value Date   CREATININE 1.58 (H) 01/13/2020   CREATININE 1.48 (H) 12/31/2019   CREATININE 1.53 (H) 12/31/2019     Physical Exam Vitals and nursing note reviewed.  Constitutional:      Appearance: He is well-developed.  HENT:     Head: Normocephalic and atraumatic.  Neck:     Vascular: No JVD.  Cardiovascular:     Rate and Rhythm: Normal rate and regular rhythm.  Pulmonary:     Effort: Pulmonary effort is normal.     Breath sounds: No wheezing or rales.  Abdominal:     General: There is no distension.     Palpations: Abdomen is soft.     Tenderness: There is no abdominal tenderness.  Musculoskeletal:        General: No tenderness.     Cervical back: Normal range of motion and neck supple.  Skin:    General: Skin is warm and dry.  Neurological:     Mental Status: He is alert and oriented  to person, [name], and time.  Psychiatric:        Behavior: Behavior normal.        Thought Content: Thought content normal.      Assessment & Plan:  1: Chronic heart failure with reduced ejection fraction- - NYHA class I - euvolemic today - weighing daily and call for an overnight weight gain of >2 pounds/weekly weight gain of >5 pounds.  - weight up 2lbs since last visit - Reports he is adding salt to food since some of it is too bland, discussion had regarding using a salt substitute  - Drinking approximately 64 oz of fluid daily - says that he's received his covid vaccines - Patient's heart rate WNL today - Patient agreeable with plan of care - Will increase Entresto to 49/51. - Saw cardiology Sharolyn Douglas) 01/13/20  2: HTN- - BP stable today - BMP from 01/13/20 reviewed and shows sodium 134, potassium 4.7, creatinine 1.58 and GFR 53 -Will draw labs at next visit - saw his PCP Garnette Gunner) 12/07/19  - Patient is planning to get a home BP monitor to monitor for hypotension with Entresto dose increase. -  Re-started Patient's atorvastatin 20mg  today that he reports he had stopped taking  3: Tobacco use- - continues to smoke about 1 ppd of cigarettes  - Therapeutic discussion with patient regarding stopping smoking. Patient reports he is not ready to quit. Discussed with patient decreasing at least 1-2 cigarettes daily by next visit. Patient in agreeable with this plan of care.  - Explained how smoking relates to his kidney function and discussion about trying to avoid further kidney dysfunction that can lead to dialysis - complete cessation discussed for 3 minutes with him  4: Alcohol use- - Reports he has stopped drinking entirely - Reports he is doing well without alcohol - Last drink x6 months ago   Patient brought his medications. Each medication was verbally reviewed with the patient.   Return here in 6 weeks or sooner for any questions/problems before then.

## 2020-02-15 ENCOUNTER — Other Ambulatory Visit: Payer: Self-pay | Admitting: Internal Medicine

## 2020-02-23 ENCOUNTER — Ambulatory Visit: Payer: Medicare Other | Admitting: Gastroenterology

## 2020-02-24 ENCOUNTER — Encounter: Payer: Self-pay | Admitting: Nurse Practitioner

## 2020-02-24 ENCOUNTER — Other Ambulatory Visit: Payer: Self-pay

## 2020-02-24 ENCOUNTER — Telehealth: Payer: Self-pay | Admitting: *Deleted

## 2020-02-24 ENCOUNTER — Telehealth (INDEPENDENT_AMBULATORY_CARE_PROVIDER_SITE_OTHER): Payer: Medicare Other | Admitting: Nurse Practitioner

## 2020-02-24 VITALS — Ht 76.0 in | Wt 184.0 lb

## 2020-02-24 DIAGNOSIS — E782 Mixed hyperlipidemia: Secondary | ICD-10-CM | POA: Diagnosis not present

## 2020-02-24 DIAGNOSIS — I5020 Unspecified systolic (congestive) heart failure: Secondary | ICD-10-CM | POA: Diagnosis not present

## 2020-02-24 DIAGNOSIS — I428 Other cardiomyopathies: Secondary | ICD-10-CM

## 2020-02-24 DIAGNOSIS — D649 Anemia, unspecified: Secondary | ICD-10-CM

## 2020-02-24 DIAGNOSIS — I2510 Atherosclerotic heart disease of native coronary artery without angina pectoris: Secondary | ICD-10-CM | POA: Diagnosis not present

## 2020-02-24 DIAGNOSIS — I10 Essential (primary) hypertension: Secondary | ICD-10-CM

## 2020-02-24 NOTE — Patient Instructions (Signed)
Medication Instructions:  Your physician recommends that you continue on your current medications as directed. Please refer to the Current Medication list given to you today.  *If you need a refill on your cardiac medications before your next appointment, please call your pharmacy*   Lab Work: None ordered.    Testing/Procedures: None ordered   Follow-Up: At [facility], you and your health needs are our priority.  As part of our continuing mission to provide you with exceptional heart care, we have created designated Provider Care Teams.  These Care Teams include your primary Cardiologist (physician) and Advanced Practice Providers (APPs -  Physician Assistants and Nurse Practitioners) who all work together to provide you with the care you need, when you need it.  We recommend signing up for the patient portal called "MyChart".  Sign up information is provided on this After Visit Summary.  MyChart is used to connect with patients for Virtual Visits (Telemedicine).  Patients are able to view lab/test results, encounter notes, upcoming appointments, etc.  Non-urgent messages can be sent to your provider as well.   To learn more about what you can do with MyChart, go to [email or web address].    Your next appointment:   3 month(s)  The format for your next appointment:   In Person  Provider:   You may see Kathlyn Sacramento, MD or one of the following Advanced Practice Providers on your designated Care Team:    Murray Hodgkins, NP

## 2020-02-24 NOTE — Progress Notes (Signed)
Virtual Visit via Telephone Note   This visit type was conducted due to national recommendations for restrictions regarding the COVID-19 Pandemic (e.g. social distancing) in an effort to limit this patient's exposure and mitigate transmission in our community.  Due to his co-morbid illnesses, this patient is at least at moderate risk for complications without adequate follow up.  This format is felt to be most appropriate for this patient at this time.  The patient did not have access to video technology/had technical difficulties with video requiring transitioning to audio format only (telephone).  All issues noted in this document were discussed and addressed.  No physical exam could be performed with this format.  Please refer to the patient's chart for his  consent to telehealth for [facility] Evaluation Performed:  Follow-up visit  This visit type was conducted due to national recommendations for restrictions regarding the COVID-19 Pandemic (e.g. social distancing).  This format is felt to be most appropriate for this patient at this time.  All issues noted in this document were discussed and addressed.  No physical exam was performed (except for noted visual exam findings with Video Visits).  Please refer to the patient's chart (MyChart message for video visits and phone note for telephone visits) for the patient's consent to telehealth for [facility]. _____________   Date:  02/24/2020   Patient ID:  Travis [name], DOB 29-Feb-1956, MRN [id number] Patient Location:  home Provider location:   office  Primary Care Provider:  [name], NP Primary Cardiologist:  Kathlyn Sacramento, MD  Chief Complaint    64 year old male with a history of alcoholic/nonischemic cardiomyopathy, HFrEF, nonobstructive CAD, TIA, tobacco abuse, hyperlipidemia, and previous excessive alcohol abuse, who presents for telephonic follow-up related to heart failure.  Past Medical History    Past Medical  History:  Diagnosis Date  . Alcohol abuse   . Alcoholic cardiomyopathy ([place]) 03/15/2013   a. 05/2019 Echo: EF 35-40%; b. 10/2019 Ehco: EF 45-50%.  . Allergy   . Asthma   . 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 Chronic anemia   . Clotting disorder ([place])   . Diabetes mellitus, type 2 ([place])    pt reports his DM is gone  . GI bleed    15 years ago  . Gout   . Hemoptysis    secondary to pulmonary edema  . HFrEF (heart failure with reduced ejection fraction) ([place])    a. 12/2010 Echo: EF 20-25%; b 08/2013 Echo: EF 45-50%; c. 01/2015 Echo: EF 25-30%; d. 07/2016 Echo: EF 35-40%; e. 05/2019 Echo: EF 35-40%; d. 10/2019 Echo: EF 45-50%, mild LVH, mild red RV fxn, mildl dil RA, Triv MR. Mild to mod Ao Sclerosis w/o stenosis.  . Hyperlipidemia   . Hypertension   . Nonischemic cardiomyopathy ([place])   . Nonobstructive Coronary artery disease    a. 01/2015 Cath: LM nl, LAD 30p/m, LCX nl, RCA 10p/m, RPDA min irregs.  . Osteoarthritis   . Pneumonia   . TIA (transient ischemic attack)   . Tobacco abuse   . Vision loss    peripherial vision only left eye.Central Retinal  artery occusion    Past Surgical History:  Procedure Laterality Date  . BIOPSY  11/25/2019   Procedure: BIOPSY;  Surgeon: Ladene Artist, MD;  Location: [facility];  Service: Endoscopy;;  . CARDIAC CATHETERIZATION    . CARDIAC CATHETERIZATION N/A 01/28/2015   Procedure: Left Heart Cath and Coronary Angiography;  Surgeon: Wellington Hampshire,  MD;  Location: [facility] CV [facility];  Service: Cardiovascular;  Laterality: N/A;  . COLONOSCOPY    . ESOPHAGOGASTRODUODENOSCOPY (EGD) WITH PROPOFOL N/A 11/25/2019   Procedure: ESOPHAGOGASTRODUODENOSCOPY (EGD) WITH PROPOFOL;  Surgeon: Ladene Artist, MD;  Location: [facility] ENDOSCOPY;  Service: Endoscopy;  Laterality: N/A;  . HIP ARTHROPLASTY Right 03/15/2013   Procedure: ARTHROPLASTY BIPOLAR HIP;  Surgeon: [name], MD;  Location: [place];  Service: Orthopedics;  Laterality:  Right;  . TOTAL SHOULDER ARTHROPLASTY Right 09/01/2015   Procedure: RIGHT TOTAL SHOULDER ARTHROPLASTY;  Surgeon: [name], MD;  Location: [place];  Service: Orthopedics;  Laterality: Right;    Allergies  No Known Allergies  History of Present Illness    Travis [name] is a 64 y.o. male who presents via audio/video conferencing for a telehealth visit today. As above he has a history of alcoholic/nonischemic cardiomyopathy with EF previously low was 20-25% in October thousand 12.  Other history includes HFimpEF/HFmrEF, nonobstructive LAD and RCA disease by catheterization November 2016, TIA, tobacco abuse, hyperlipidemia, and previous excessive alcohol use.  Echocardiogram in March 2021 showed an EF of 35-40% with global hypokinesis and grade 1 diastolic dysfunction.  He was hospitalized in August 2021 with transient vision loss and dizziness.  Stroke work-up was negative.  He was noted to be anemic with a hemoglobin of 9.  Fecal occult blood was negative.  Acute kidney injury was noted with a creatinine up to 3.29 that improved with hydration, but only to 2.4.  Required rehospitalization in late August with dizziness and blurred vision was found to be febrile and hypotensive.  Hemoglobin was down to 6.6 and he required transfusion.  Anemia work-up revealed reflux esophagitis.  [name] was discontinued in the setting of hypotension.  Fever was of unknown origin.  Renal function returned to baseline with a creatinine of 1.26.  Echocardiogram during hospitalization showed an EF of 45-50%.  Entresto therapy was resumed as an outpatient in late September 2021.  At his last visit on October 20, he reported an episode of sudden lightheadedness and diaphoresis associated with fatigue which lasted about 15 minutes.  He was offered an event monitor but deferred.  Lab work that day showed stable renal function (BUN/creatinine 18/1.58), and H&H (11.3/33.7).  Carvedilol, Entresto, and spironolactone were continued at  previous doses.  He subsequently followed up in heart failure clinic on November 16, at which time he was doing well from a heart failure standpoint.  Entresto was increased to 49/51 mg twice daily.  He has not yet increased his Entresto - waiting until he runs out of his current bottle.  He has been doing well without any recurrence of presyncope/fatigue.  He denies chest pain, dyspnea, palpitations, PND, orthopnea, edema, or early satiety.  He weighs himself every day and his weights have been stable.  He does not have a blood pressure cuff at home.  The patient does not have symptoms concerning for COVID-19 infection (fever, chills, cough, or new shortness of breath).   Home Medications    Prior to Admission medications   Medication Sig Start Date End Date Taking? Authorizing Provider  albuterol (PROVENTIL HFA;VENTOLIN HFA) 108 (90 BASE) MCG/ACT inhaler Inhale 2 puffs into the lungs every 6 (six) hours as needed for wheezing or shortness of breath. Take 2 puffs every 5-10 minutes up to 6 puffs total over 15 minutes when needed.  Use with a spacer. 01/24/15  Yes Ahmed Prima, MD  allopurinol (ZYLOPRIM) 100 MG tablet Take 2  tablets (200 mg total) by mouth daily. 01/23/19  Yes [name], NP  aspirin EC 325 MG tablet Take 162.5 mg by mouth daily.   Yes [provider]  atorvastatin (LIPITOR) 20 MG tablet Take 1 tablet (20 mg total) by mouth daily. 02/09/20 05/09/20 Yes Hackney, Otila Kluver A, FNP  carvedilol (COREG) 12.5 MG tablet Take 6.25 mg by mouth 2 (two) times daily with a meal.   Yes [provider]  cetirizine (ZYRTEC) 10 MG tablet TAKE 1 TABLET BY MOUTH DAILY 07/22/19  Yes [name], NP  fenofibrate (TRICOR) 145 MG tablet TAKE 1 TABLET BY MOUTH ONCE A DAY 12/23/19  Yes [name], NP  ferrous sulfate 325 (65 FE) MG tablet Take 1 tablet (325 mg total) by mouth daily with breakfast. 11/27/19 02/25/20 Yes Hall, Carole N, DO  fluticasone (FLONASE) 50 MCG/ACT nasal  spray Place 1 spray into both nostrils 2 (two) times daily. 03/29/16  Yes Cuthriell, Charline Bills, PA-C  Multiple Vitamin (MULTIVITAMIN WITH MINERALS) TABS tablet Take 1 tablet by mouth daily. Men's One a Day   Yes [provider]  Omega-3 Fatty Acids (FISH OIL PO) Take 1 capsule by mouth daily.   Yes [provider]  pantoprazole (PROTONIX) 40 MG tablet Take 1 tablet (40 mg total) by mouth daily. 12/11/19 03/10/20 Yes Hall, Carole N, DO  sacubitril-valsartan (ENTRESTO) 97-103 MG Take 0.5 tablets by mouth 2 (two) times daily.   Yes [provider]  spironolactone (ALDACTONE) 25 MG tablet Take 0.5 tablets (12.5 mg total) by mouth daily. 12/01/19 02/29/20 Yes Hackney, Tina A, FNP  triamcinolone (KENALOG) 0.1 % APPLY TO AFFECTED AREAS TWICE A DAY AS NEEDED. [place], OR UNDERARMS 02/15/20  Yes [name], NP    Review of Systems    He denies chest pain, palpitations, dyspnea, pnd, orthopnea, n, v, dizziness, syncope, edema, weight gain, or early satiety.  All other systems reviewed and are otherwise negative except as noted above.  Physical Exam    Vital Signs:  Ht 6\' 4"  (1.93 m)   Wt 184 lb (83.5 kg)   BMI 22.40 kg/m    Exam limited by telephonic nature of visit.  He is awake alert and oriented x3.  Pleasant.  No acute distress.  Speech unlabored.  Accessory Clinical Findings    Lab Results  Component Value Date   CREATININE 1.58 (H) 01/13/2020   BUN 18 01/13/2020   NA 142 01/13/2020   K 4.7 01/13/2020   CL 106 01/13/2020   CO2 25 01/13/2020    Lab Results  Component Value Date   WBC 6.3 01/13/2020   HGB 11.3 (L) 01/13/2020   HCT 33.7 (L) 01/13/2020   MCV 98 (H) 01/13/2020   PLT 250 01/13/2020   Lab Results  Component Value Date   CHOL 150 11/09/2019   HDL 47 11/09/2019   LDLCALC 88 11/09/2019   LDLDIRECT 69.0 12/30/2018   TRIG 77 11/09/2019   CHOLHDL 3.2 11/09/2019    Lab Results  Component Value Date   ALT 11 12/07/2019   AST 17  12/07/2019   ALKPHOS 47 12/07/2019   BILITOT 0.4 12/07/2019    Assessment & Plan    1.  Chronic heart failure with improved ejection fraction/nonischemic cardiomyopathy: EF previously as low as 20-25% in October 2012 with improvement to 45-50% by echo in August 2021.  He reports stable weights and volumes since his last visit.  His Entresto dose is due to be increased  to 49-51 mg twice daily once he runs out of current bottle.  He has not had any recurrence of presyncope.  He otherwise remains on carvedilol and spironolactone.  We did discuss possibly adding a SGLT2 inhibitor but will hold off until we see how he does with a higher dose of Entresto.  With a GFR of approximately 53, this would have to be used cautiously.  2.  Essential hypertension: Patient does not check his blood pressure at home.  He is due to increase Entresto.  His blood pressure was normal at his most recent heart failure clinic visit.  I did recommend he obtain a cuff to use at home and also advised that if after increasing Entresto, he begins to experience orthostatic symptoms, he should notify us immediately.  3.  Nonobstructive CAD: No chest pain.  He remains on aspirin and statin therapy.  4.  Hyperlipidemia: LDL of 88 in August with normal LFTs in September.  He remains on statin therapy.  5.  Tobacco abuse: Continues to smoke.  Cessation advised.  6.  Alcohol use: Quit after August hospitalization.  Continued avoidance advised.  7.  Anemia: Status post transfusion during August hospitalization.  CBC was stable at the time of October visit.  8.  Presyncope: No recurrence since October visit.  Follow-up with adjustment to [facility] that is planned.  9.  Disposition: He has follow-up in heart failure clinic in January.  Follow-up here in approximately 3 months.  COVID-19 Education: The signs and symptoms of COVID-19 were discussed with the patient and how to seek care for testing (follow up with PCP or arrange  E-visit).  The importance of social distancing was discussed today.  Patient Risk:   After full review of this patient's history and clinical status, I feel that he is at least moderate risk for cardiac complications at this time, thus necessitating a telehealth visit sooner than our first available in office visit.  Time:   Today, I have spent 15 minutes with the patient with telehealth technology discussing medical history, symptoms, and management plan.     Murray Hodgkins, NP 02/24/2020, 2:08 PM

## 2020-02-24 NOTE — Telephone Encounter (Signed)
  Patient Consent for Virtual Visit         [name] has provided verbal consent on 02/24/2020 for a virtual visit (video or telephone).   CONSENT FOR VIRTUAL VISIT FOR:  Travis [name]  By participating in this virtual visit I agree to the following:  I hereby voluntarily request, consent and authorize [name] and its employed or contracted physicians, physician assistants, nurse practitioners or other licensed health care professionals (the Practitioner), to provide me with telemedicine health care services (the "Services") as deemed necessary by the treating Practitioner. I acknowledge and consent to receive the Services by the Practitioner via telemedicine. I understand that the telemedicine visit will involve communicating with the Practitioner through live audiovisual communication technology and the disclosure of certain medical information by electronic transmission. I acknowledge that I have been given the opportunity to request an in-person assessment or other available alternative prior to the telemedicine visit and am voluntarily participating in the telemedicine visit.  I understand that I have the right to withhold or withdraw my consent to the use of telemedicine in the course of my care at any time, without affecting my right to future care or treatment, and that the Practitioner or I may terminate the telemedicine visit at any time. I understand that I have the right to inspect all information obtained and/or recorded in the course of the telemedicine visit and may receive copies of available information for a reasonable fee.  I understand that some of the potential risks of receiving the Services via telemedicine include:  [name] Delay or interruption in medical evaluation due to technological equipment failure or disruption; . Information transmitted may not be sufficient (e.g. poor resolution of images) to allow for appropriate medical decision making by the Practitioner;  and/or  . In rare instances, security protocols could fail, causing a breach of personal health information.  Furthermore, I acknowledge that it is my responsibility to provide information about my medical history, conditions and care that is complete and accurate to the best of my ability. I acknowledge that Practitioner's advice, recommendations, and/or decision may be based on factors not within their control, such as incomplete or inaccurate data provided by me or distortions of diagnostic images or specimens that may result from electronic transmissions. I understand that the practice of medicine is not an exact science and that Practitioner makes no warranties or guarantees regarding treatment outcomes. I acknowledge that a copy of this consent can be made available to me via my patient portal ([place]), or I can request a printed copy by calling the office of Travis [name].    I understand that my insurance will be billed for this visit.   I have read or had this consent read to me. . I understand the contents of this consent, which adequately explains the benefits and risks of the Services being provided via telemedicine.  . I have been provided ample opportunity to ask questions regarding this consent and the Services and have had my questions answered to my satisfaction. . I give my informed consent for the services to be provided through the use of telemedicine in my medical care

## 2020-02-26 ENCOUNTER — Encounter: Payer: Self-pay | Admitting: *Deleted

## 2020-02-26 ENCOUNTER — Other Ambulatory Visit: Payer: Self-pay | Admitting: *Deleted

## 2020-02-26 NOTE — Patient Outreach (Signed)
[facility]) Care Management [place] Telephone Outreach  02/26/2020  Travis [name] Jan 31, 1956 [id number]  Successful outreach attempt to Travis [name], referred to this [facility] RN CM 01/26/20 on re-assignment from previous [place]; original referral received 11/26/19 by [facility] Liaison after patient experienced hospital admission August 27- November 26, 2019 for sepsis/ hypotension/ anemia.  Patient was discharged home to self-care without home health services in place.  Patient has history including, but not limited to, non-ischemic cardiomyopathy; CHF; CKD-III; HTN/ HLD; DM-II (A1-C 7.3 on 11/09/19); previous TIA; tobacco and ETOH use.  HIPAA/ identity verified, patient reports doing "pretty good," and states he is out paying bills and is unable to complete medication review; confirms that he attended recent cardiology and CHF clinic visits- able to verbalize new dosing of Entrestro; reports taking as prescribed.  Encouraged him to complete medication review with me at time of next outreach, states he "will try;" denies concerns around medications.  Confirms he spoke with [facility] CSW around his previously stated transportation issues: reports that he does not have the phone number for [facility] provided to him by [place]; adds that he does not have a [employer] card, but "has a lot of paperwork" at home- encouraged him to review his papers at home; I will reach out to [place] team and to [facility] CSW to obtain previously provided phone number to patient.  Patient is vague in reports of how he is obtaining transportation to provider appointments and errands today; states "doing the best I can."  Inconsistently monitoring daily weights at home; previously provided education was reiterated/ reviewed with patient; reports general weight ranges consistently between 184-186 lbs.  Reports not monitoring blood pressures at home- states "too much going on"  Continues to smoke- cessation  again encouraged, along with use of previously provided resources for same  Patient denies further issues, concerns, or problems today. I confirmed that patient hasmy direct phone number, the main [place] office phone number, and the [facility] CM 24-hour nurse advice phone number should issues arise prior to next scheduled [facility] CM outreach to hopefully complete updated medication review/ follow up on insurance transportation benefit.  Encouraged patient to contact me directly if needs, questions, issues, or concerns arise prior to next scheduled outreach; patient agreed to do so.  Plan:  Patient will take medications as prescribed and will attend all scheduled provider appointments  Patient will promptly notify care providers for any new concerns/ issues/ problems that arise  Patient will begin consistently monitoring/ recording daily weights and blood pressures  [facility] CM outreach to continue with scheduled phone call in 2 weeks, unless indicated sooner  Oneta Rack, RN, BSN, [place] Care Management  [phone]

## 2020-03-07 ENCOUNTER — Encounter: Payer: Self-pay | Admitting: *Deleted

## 2020-03-07 ENCOUNTER — Other Ambulatory Visit: Payer: Self-pay | Admitting: *Deleted

## 2020-03-07 NOTE — Patient Instructions (Signed)
Goals Addressed            This Visit's Progress   . COMPLETED: THN CM: Find Help in My Community   On track    Follow Up Date 03/07/20   - begin a notebook of services in my neighborhood or community - call 211 when I need some help - follow-up on any referrals for help I am given - think ahead to make sure my need does not become an emergency - have a back-up plan - make a list of family or friends that I can call    Why is this important?   Knowing how and where to find help for yourself or family in your neighborhood and community is an important skill.  You will want to take some steps to learn how.    Notes:   02/02/20: [place] work referral placed to discuss potential transportation needs due to reported car issues  02/26/20: -- confirmed that [place] contacted patient; placed care coordination outreach to CSW/ [place] team to follow up on patient's questions around insurance transportation benefit  03/07/20: -- confirmed with patient that he has phone number for new insurance starting in January 2022: provided phone number to confirm ([employer]: [phone]) -- goal completed    . THN CM: Maintain My Quality of Life   On track    Follow Up Date 04/07/20   - discuss my treatment options with the doctor or nurse - make shared treatment decisions with doctor    Why is this important?   Having a long-term illness can be scary.  It can also be stressful for you and your caregiver.  These steps may help.    Notes:   03/07/20: -- please take steps to stop smoking -- talk to your doctor about the amount of alcohol (if any) it is okay for you to have: please practice moderation if you consume alcohol and do not drive if you have been drinking alcohol    . THN CM: Make and Keep All Appointments   On track    Follow Up Date 04/07/20   - ask family or friend for a ride - keep a calendar with appointment dates - learn the bus route - use public  transportation    Why is this important?   Part of staying healthy is seeing the doctor for follow-up care.  If you forget your appointments, there are some things you can do to stay on track.    Notes:   02/02/20: Recent and upcoming provider appointments reviewed with patient; confirmed patient aware of all and has plans to attend all as scheduled  02/26/20: -- confirmed that [place] contacted patient; placed care coordination outreach to CSW/ [place] team to follow up on patient's questions around insurance transportation benefit -- Recent and upcoming provider appointments reviewed with patient; confirmed patient aware of all and has plans to attend all as scheduled  03/07/20: -- recent and upcoming provider appointments reviewed with patient; confirmed he has phone number for previously provided transportation services through his health insurance provider    . THN CM: Protect My Health   On track    Follow Up Date 04/07/20    - schedule appointment for vaccines needed due to my age or health - schedule and keep appointment for annual check-up    Why is this important?   Screening tests can find diseases early when they are easier to treat.  Your doctor or nurse  will talk with you about which tests are important for you.  Getting shots for common diseases like the flu and shingles will help prevent them.     Notes:   02/02/20: Please continue efforts to quit smoking  02/26/20: -- Please continue efforts to quit smoking  03/07/20: -- please take steps to stop smoking -- talk to your doctor about the amount of alcohol (if any) it is okay for you to have: please practice moderation if you consume alcohol and do not drive if you have been drinking alcohol    . THN CM: Track and Manage Fluids and Swelling   On track    Follow Up Date 04/07/20   - call office if I gain more than 2 pounds in one day or 5 pounds in one week - do ankle pumps when sitting - keep legs up while  sitting - track weight in diary - use salt in moderation - watch for swelling in feet, ankles and legs every day - weigh myself daily    Why is this important?   It is important to check your weight daily and watch how much salt and liquids you have.  It will help you to manage your heart failure.    Notes:   02/02/20: Please start monitoring/ recording daily weights at home to stay on top of any fluid accumulation: daily weights at home are the best way to monitor if you are retaining fluid; weight gain is the earliest indicator that you have started retaining fluid  02/26/20: -- reinforced previously provided education around importance of/ rationale for consistent daily weight monitoring and recording: patient continues to require reinforcement of same -- reviewed signs/ symptoms CHF yellow zone along with corresponding action plan for symptoms development: continues to require reinforcement  03/07/20: -- reviewed recent patient reported daily weights at home: patient reports stable weights consistently around "180" lbs -- reinforced previously provided education around importance of self- management of CHF at home/ daily weight and symptom monitoring along with corresponding action plan for development of signs/ symptoms: encouraged patient to review previously and newly provided educational material- patient requires ongoing reinforcement  -- medication review completed with patient; no concerns identified; patient denies medication concerns -- reiterated importance of smoking cessation: patient not ready to stop smoking -- discussed importance of moderation with alcohol intake over holidays and encouraged patient to discuss alcohol intake with care providers -- sent [facility] CM note/ care plan to patient's PCP as quarterly update/ summary    . THN CM: Track and Manage Symptoms   On track    Follow Up Date 04/07/20   - develop a rescue plan - eat more whole grains, fruits and vegetables,  lean meats and healthy fats - follow rescue plan if symptoms flare-up - know when to call the doctor - track symptoms and what helps feel better or worse - dress right for the weather, hot or cold    Why is this important?   You will be able to handle your symptoms better if you keep track of them.  Making some simple changes to your lifestyle will help.  Eating healthy is one thing you can do to take good care of yourself.    Notes:   02/02/20: Contact your care providers/ doctors promptly if you believe your condition is worsening  02/26/20: -- discussed current clinical condition with patient; confirms no current clinical concerns -- confirmed no concerns around medications: encouraged completion of medication review at time of  next outreach  03/07/20: -- reviewed recent patient reported daily weights at home: patient reports stable weights consistently around "180" lbs -- reinforced previously provided education around importance of self- management of CHF at home/ daily weight and symptom monitoring along with corresponding action plan for development of signs/ symptoms: encouraged patient to review previously and newly provided educational material- patient requires ongoing reinforcement  -- medication review completed with patient; no concerns identified; patient denies medication concerns -- reiterated importance of smoking cessation: patient not ready to stop smoking -- discussed importance of moderation with alcohol intake over holidays and encouraged patient to discuss alcohol intake with care providers -- sent [facility] CM note/ care plan to patient's PCP as quarterly update/ summary       Heart Failure Action Plan A heart failure action plan helps you understand what to do when you have symptoms of heart failure. Follow the plan that was created by you and your health care provider. Review your plan each time you visit your health care provider. Red zone These signs and  symptoms mean you should get medical help right away:  You have trouble breathing when resting.  You have a dry cough that is getting worse.  You have swelling or pain in your legs or abdomen that is getting worse.  You suddenly gain more than 2-3 lb (0.9-1.4 kg) in a day, or more than 5 lb (2.3 kg) in one week. This amount may be more or less depending on your condition.  You have trouble staying awake or you feel confused.  You have chest pain.  You do not have an appetite.  You pass out. If you experience any of these symptoms:  Call your local emergency services (911 in the [place] right away or seek help at the emergency department of the nearest hospital. Yellow zone These signs and symptoms mean your condition may be getting worse and you should make some changes:  You have trouble breathing when you are active or you need to sleep with extra pillows.  You have swelling in your legs or abdomen.  You gain 2-3 lb (0.9-1.4 kg) in one day, or 5 lb (2.3 kg) in one week. This amount may be more or less depending on your condition.  You get tired easily.  You have trouble sleeping.  You have a dry cough. If you experience any of these symptoms:  Contact your health care provider within the next day.  Your health care provider may adjust your medicines. Green zone These signs mean you are doing well and can continue what you are doing:  You do not have shortness of breath.  You have very little swelling or no new swelling.  Your weight is stable (no gain or loss).  You have a normal activity level.  You do not have chest pain or any other new symptoms. Follow these instructions at home:  Take over-the-counter and prescription medicines only as told by your health care provider.  Weigh yourself daily. Your target weight is __________ lb (__________ kg). ? Call your health care provider if you gain more than __________ lb (__________ kg) in a day, or more than  __________ lb (__________ kg) in one week.  Eat a heart-healthy diet. Work with a diet and nutrition specialist (dietitian) to create an eating plan that is best for you.  Keep all follow-up visits as told by your health care provider. This is important. Where to find more information  American Heart Association: www.heart.org Summary  Follow the action plan that was created by you and your health care provider.  Get help right away if you have any symptoms in the Red zone. This information is not intended to replace advice given to you by your health care provider. Make sure you discuss any questions you have with your health care provider. Document Revised: 02/22/2017 Document Reviewed: 04/21/2016 Elsevier Patient Education  [place].   Heart Failure, Self Care Heart failure is a serious condition. This sheet explains things you need to do to take care of yourself at home. To help you stay as healthy as possible, you may be asked to change your diet, take certain medicines, and make other changes in your life. Your doctor may also give you more specific instructions. If you have problems or questions, call your doctor. What are the risks? Having heart failure makes it more likely for you to have some problems. These problems can get worse if you do not take good care of yourself. Problems may include:  Blood clotting problems. This may cause a stroke.  Damage to the kidneys, liver, or lungs.  Abnormal heart rhythms. Supplies needed:  Scale for weighing yourself.  Blood pressure monitor.  Notebook.  Medicines. How to care for yourself when you have heart failure Medicines Take over-the-counter and prescription medicines only as told by your doctor. Take your medicines every day.  Do not stop taking your medicine unless your doctor tells you to do so.  Do not skip any medicines.  Get your prescriptions refilled before you run out of medicine. This is  important. Eating and drinking   Eat heart-healthy foods. Talk with a diet specialist (dietitian) to create an eating plan.  Choose foods that: ? Have no trans fat. ? Are low in saturated fat and cholesterol.  Choose healthy foods, such as: ? Fresh or frozen fruits and vegetables. ? Fish. ? Low-fat (lean) meats. ? Legumes, such as beans, peas, and lentils. ? Fat-free or low-fat dairy products. ? Whole-grain foods. ? High-fiber foods.  Limit salt (sodium) if told by your doctor. Ask your diet specialist to tell you which seasonings are healthy for your heart.  Cook in healthy ways instead of frying. Healthy ways of cooking include roasting, grilling, broiling, baking, poaching, steaming, and stir-frying.  Limit how much fluid you drink, if told by your doctor. Alcohol use  Do not drink alcohol if: ? Your doctor tells you not to drink. ? Your heart was damaged by alcohol, or you have very bad heart failure. ? You are pregnant, may be pregnant, or are planning to become pregnant.  If you drink alcohol: ? Limit how much you use to:  0-1 drink a day for women.  0-2 drinks a day for men. ? Be aware of how much alcohol is in your drink. In the [place]., one drink equals one 12 oz bottle of beer (355 mL), one 5 oz glass of wine (148 mL), or one 1 oz glass of hard liquor (44 mL). Lifestyle   Do not use any products that contain nicotine or tobacco, such as cigarettes, e-cigarettes, and chewing tobacco. If you need help quitting, ask your doctor. ? Do not use nicotine gum or patches before talking to your doctor.  Do not use illegal drugs.  Lose weight if told by your doctor.  Do physical activity if told by your doctor. Talk to your doctor before you begin an exercise if: ? You are an older adult. ? You have  very bad heart failure.  Learn to manage stress. If you need help, ask your doctor.  Get rehab (rehabilitation) to help you stay independent and to help with your  quality of life.  Plan time to rest when you get tired. Check weight and blood pressure   Weigh yourself every day. This will help you to know if fluid is building up in your body. ? Weigh yourself every morning after you pee (urinate) and before you eat breakfast. ? Wear the same amount of clothing each time. ? Write down your daily weight. Give your record to your doctor.  Check and write down your blood pressure as told by your doctor.  Check your pulse as told by your doctor. Dealing with very hot and very cold weather  If it is very hot: ? Avoid activities that take a lot of energy. ? Use air conditioning or fans, or find a cooler place. ? Avoid caffeine and alcohol. ? Wear clothing that is loose-fitting, lightweight, and light-colored.  If it is very cold: ? Avoid activities that take a lot of energy. ? Layer your clothes. ? Wear mittens or gloves, a hat, and a scarf when you go outside. ? Avoid alcohol. Follow these instructions at home:  Stay up to date with shots (vaccines). Get pneumococcal and flu (influenza) shots.  Keep all follow-up visits as told by your doctor. This is important. Contact a doctor if:  You gain weight quickly.  You have increasing shortness of breath.  You cannot do your normal activities.  You get tired easily.  You cough a lot.  You don't feel like eating or feel like you may vomit (nauseous).  You become puffy (swell) in your hands, feet, ankles, or belly (abdomen).  You cannot sleep well because it is hard to breathe.  You feel like your heart is beating fast (palpitations).  You get dizzy when you stand up. Get help right away if:  You have trouble breathing.  You or someone else notices a change in your behavior, such as having trouble staying awake.  You have chest pain or discomfort.  You pass out (faint). These symptoms may be an emergency. Do not wait to see if the symptoms will go away. Get medical help right  away. Call your local emergency services (911 in the [place] Do not drive yourself to the hospital. Summary  Heart failure is a serious condition. To care for yourself, you may have to change your diet, take medicines, and make other lifestyle changes.  Take your medicines every day. Do not stop taking them unless your doctor tells you to do so.  Eat heart-healthy foods, such as fresh or frozen fruits and vegetables, fish, lean meats, legumes, fat-free or low-fat dairy products, and whole-grain or high-fiber foods.  Ask your doctor if you can drink alcohol. You may have to stop alcohol use if you have very bad heart failure.  Contact your doctor if you gain weight quickly or feel that your heart is beating too fast. Get help right away if you pass out, or have chest pain or trouble breathing. This information is not intended to replace advice given to you by your health care provider. Make sure you discuss any questions you have with your health care provider. Document Revised: 06/24/2018 Document Reviewed: 06/25/2018 Elsevier Patient Education  [place].

## 2020-03-07 NOTE — Patient Outreach (Signed)
[facility]) Care Management [place] Telephone Outreach  03/07/2020  RICHARDS PHERIGO November 18, 1955 [id number]  Successful outreach attempt to Travis [name], referred to this [facility] RN CM 01/26/20 on re-assignment from previous [place]; original referral received 11/26/19 by [facility] Liaison after patient experienced hospital admission August 27- September 2, 2090for sepsis/ hypotension/ anemia. Patient was discharged home to self-care without home health services in place. Patient has history including, but not limited to, non-ischemic cardiomyopathy; CHF; CKD-III; HTN/ HLD; DM-II (A1-C 7.3 on 11/09/19); previous TIA; tobacco and ETOH use.  HIPAA/ identity verified, patient reports doing "real good," and he denies clinical concerns/ issues/ problems today; sounds to be in no distress throughout phone call today.  Remains independent in ADL's and iADL's; reports he now has the phone number for [facility] transportation services, but adds that these benefits do not start until January 2022; declines ongoing need for transportation today. Number was re-provided to patient, as he requested same.  Reports continues to self-manage medications and medications were reviewed with patient today; no concerns/ discrepancies noted; medication list in EHR updated according to patient report.  Confirms he continues to monitor/ record daily weights at home; reports weights staying "right at 180 lbs;" reviewed/ reinforced previously provided education around signs/ symptoms CHF yellow zone with corresponding action plan.  Reports no upcoming scheduled provider appointments; confirms that he continues to smoke, "about a pack a day;" tells me today, that he "may" resume having occasional alcoholic drinks, given that it is the holiday season- patient stated that he knows "to go light" if he decides to consume alcoholic beverages; encouraged to avoid alcohol.  Does not consistently monitor blood pressures  at home.  Patient denies further issues, concerns, or problems today. Explained to patient that he may have a different RN CM contact him for next month's call, now that he will have [employer] in place; he is agreeable.  Iconfirmed that patient hasmy direct phone number, the main [facility] CM office phone number, and the [facility] CM 24-hour nurse advice phone number should issues arise prior to next [place] outreach. Encouraged patient to contact me directly if needs, questions, issues, or concerns arise prior to next scheduled outreach; patient agreed to do so.  Plan:  Patient will take medications as prescribed and will attend all scheduled provider appointments  Patient will promptly notify care providers for any new concerns/ issues/ problems that arise  Patient willbegin consistentlymonitoring/ recording daily weights   THN CM outreach to continue with outreach phone call next monnth, unless indicated sooner  Oneta Rack, RN, BSN, [place] Care Management  [phone]

## 2020-03-31 ENCOUNTER — Ambulatory Visit: Payer: PRIVATE HEALTH INSURANCE | Admitting: Family

## 2020-04-01 ENCOUNTER — Encounter: Payer: Self-pay | Admitting: *Deleted

## 2020-04-01 ENCOUNTER — Other Ambulatory Visit: Payer: Self-pay | Admitting: *Deleted

## 2020-04-01 NOTE — Patient Outreach (Signed)
[facility]) Care Management [place] Telephone Outreach  04/01/2020  Travis [name] 1955/04/07 [id number]  Successful outreach attempt to Travis [name], referred to this [facility] RN CM 01/26/20 on re-assignment from previous [place]; original referral received 11/26/19 by [facility] Liaison after patient experienced hospital admission August 27- September 2, 2070for sepsis/ hypotension/ anemia. Patient was discharged home to self-care without home health services in place. Patient has history including, but not limited to, non-ischemic cardiomyopathy; CHF; CKD-III; HTN/ HLD; DM-II (A1-C 7.3 on 11/09/19); previous TIA; tobacco and ETOH use.  HIPAA/ identity verified,patient reports doing "pretty good," and he denies clinical concerns/ issues/ problems today; sounds to be in no distress throughout phone call today.    -- discussed current  clinical condition and confirmed no current clinical or medication concerns; confirmed patient continues to manage medications independently at home and reports adherence to medication regimen -- reinforced previously provided education and importance/ rationale for daily weight monitoring/ recording at home -- reinforced previously provided education around signs/ symptoms yellow CHF zone/ weight gain guidelines in setting of CHF along with corresponding action plan:  patient continues to require ongoing reinforcement around same -- reviewed recent weights at home with patient: patient reports general weight ranges at home between 172-175 lbs, with weight today of 173 lbs, taken during time of our phone call (afternoon) -- confirmed patient is not consistently monitoring/ recording daily weights: reinforced importance/ rationale for doing so and encouraged patient to begin daily weight monitoring at the same time each day upon waking -- confirmed patient continues to endorse following low [name], heart healthy diet -- reviewed upcoming provider  appointments with patient- confirmed patient currently arranging rides independently, remains aware of insurance transportation benefit, but has not yet used -- reinforced previously provided education/ support for smoking cessation; patient continues to smoke ~ 1 ppd: encouraged ongoing smoking cessation/ decrease  Patient denies further issues, concerns, or problems today. Explained once again to patient that he may have a different RN CM contact him for next month's call; he is again agreeable.  Iconfirmed that patient hasmy direct phone number, the main [facility] CM office phone number, and the [facility] CM 24-hour nurse advice phone number should issues arise prior to next [place] outreach. Encouraged patient to contact me directly if needs, questions, issues, or concerns arise prior to next scheduled outreach; patient agreed to do so.  Plan:  Patient will take medications as prescribed and will attend all scheduled provider appointments  Patient will promptly notify care providers for any new concerns/ issues/ problems that arise  Patient willbeginconsistentlymonitoring/ recording daily weights at the same time every day  Patient will continue efforts to stop smoking  I will send today's note/ care plan to patient's PCP as [facility] CM quarterly update summary   THN CM outreach to continue with outreach phone callwithin next 60 days, unless indicated sooner  Oneta Rack, RN, BSN, [place] Care Management  [phone]

## 2020-04-01 NOTE — Patient Instructions (Signed)
Goals Addressed            This Visit's Progress   . [facility] CM: Maintain My Quality of Life   On track    Follow Up Date 06/03/20   - discuss my treatment options with the doctor or nurse - make shared treatment decisions with doctor    Why is this important?   Having a long-term illness can be scary.  It can also be stressful for you and your caregiver.  These steps may help.    Notes:   03/07/20: -- please take steps to stop smoking -- talk to your doctor about the amount of alcohol (if any) it is okay for you to have: please practice moderation if you consume alcohol and do not drive if you have been drinking alcohol  04/01/20: -- keep up your efforts to stop smoking -- I am glad that you are managing your medications and appointments independently: if questions or problems arise, please let your care providers know -- continue to consider using your transportation benefit for doctor appointments that we have been discussing    . [facility] CM: Make and Keep All Appointments   On track    Follow Up Date 06/03/20   - ask family or friend for a ride - keep a calendar with appointment dates - learn the bus route - use public transportation    Why is this important?   Part of staying healthy is seeing the doctor for follow-up care.  If you forget your appointments, there are some things you can do to stay on track.    Notes:   02/02/20: Recent and upcoming provider appointments reviewed with patient; confirmed patient aware of all and has plans to attend all as scheduled  02/26/20: -- confirmed that [place] contacted patient; placed care coordination outreach to CSW/ [place] team to follow up on patient's questions around insurance transportation benefit -- Recent and upcoming provider appointments reviewed with patient; confirmed patient aware of all and has plans to attend all as scheduled  03/07/20: -- recent and upcoming provider appointments reviewed with patient;  confirmed he has phone number for previously provided transportation services through his health insurance provider  04/01/20: -- good job re-scheduling appointments that you are unable to keep -- please consider using the phone number for your insurance transportation services/ benefit as we have discussed -- keep a calendar of your doctor appointments so you don't miss any of them: I have placed a [facility] CM 2022 calendar book in the mail to you to keep up with your appointments; this calendar also has a section where you can keep track of your daily weights at home: it is very important that you write your weights down on paper every day    . [facility] CM: Protect My Health   On track    Follow Up Date 06/03/20    - schedule appointment for vaccines needed due to my age or health - schedule recommended health tests (blood work, mammogram, colonoscopy, pap test) - schedule and keep appointment for annual check-up    Why is this important?   Screening tests can find diseases early when they are easier to treat.  Your doctor or nurse will talk with you about which tests are important for you.  Getting shots for common diseases like the flu and shingles will help prevent them.     Notes:   02/02/20: Please continue efforts to quit smoking  02/26/20: -- Please continue efforts  to quit smoking  03/07/20: -- please take steps to stop smoking -- talk to your doctor about the amount of alcohol (if any) it is okay for you to have: please practice moderation if you consume alcohol and do not drive if you have been drinking alcohol  04/01/20: -- please continue trying to stop smoking -- keep a calendar of your routine health screening needs so you don't miss any of them: I have placed a [facility] CM 2022 calendar book in the mail to you to keep up with your appointments/ goals/ needed routine screening dates; this calendar also has a section where you can keep track of your daily weights at home: it is very  important that you write your weights down on paper every day    . [facility] CM: Track and Manage Fluids and Swelling   On track    Follow Up Date 06/03/20   - call office if I gain more than 2 pounds in one day or 5 pounds in one week - do ankle pumps when sitting - keep legs up while sitting - track weight in diary - use salt in moderation - watch for swelling in feet, ankles and legs every day - weigh myself daily    Why is this important?   It is important to check your weight daily and watch how much salt and liquids you have.  It will help you to manage your heart failure.    Notes:   02/02/20: Please start monitoring/ recording daily weights at home to stay on top of any fluid accumulation: daily weights at home are the best way to monitor if you are retaining fluid; weight gain is the earliest indicator that you have started retaining fluid  02/26/20: -- reinforced previously provided education around importance of/ rationale for consistent daily weight monitoring and recording: patient continues to require reinforcement of same -- reviewed signs/ symptoms CHF yellow zone along with corresponding action plan for symptoms development: continues to require reinforcement  03/07/20: -- reviewed recent patient reported daily weights at home: patient reports stable weights consistently around "180" lbs -- reinforced previously provided education around importance of self- management of CHF at home/ daily weight and symptom monitoring along with corresponding action plan for development of signs/ symptoms: encouraged patient to review previously and newly provided educational material- patient requires ongoing reinforcement  -- medication review completed with patient; no concerns identified; patient denies medication concerns -- reiterated importance of smoking cessation: patient not ready to stop smoking -- discussed importance of moderation with alcohol intake over holidays and encouraged  patient to discuss alcohol intake with care providers -- sent [facility] CM note/ care plan to patient's PCP as quarterly update/ summary  04/01/20: -- I am glad that the weights you have reported are stable: today, you reported weights consistently around "172-273" lbs with weight today of 173 lbs -- please make every effort to weigh yourself daily at home upon waking: it is important that you weigh yourself at the same general time each day, upon waking and getting out of bed -- continue reviewing the information we have discussed about signs/ symptoms that you are retaining fluid: follow the action plan we have discussed and notify your care providers right away if you develop these signs/ symptoms -- great job managing your medications- keep up the great work- if you have questions or concerns about your medications, please promptly contact your care providers -- please continue to try and decrease or stop smoking  --  please continue to follow a heart healthy, low salt diet: this is very important to help prevent you from retaining fluid     . [facility] CM: Track and Manage Symptoms   On track    Follow Up Date 06/03/20   - begin a heart failure diary - bring diary to all appointments - develop a rescue plan - eat more whole grains, fruits and vegetables, lean meats and healthy fats - follow rescue plan if symptoms flare-up - know when to call the doctor - track symptoms and what helps feel better or worse - dress right for the weather, hot or cold    Why is this important?   You will be able to handle your symptoms better if you keep track of them.  Making some simple changes to your lifestyle will help.  Eating healthy is one thing you can do to take good care of yourself.    Notes:   02/02/20: Contact your care providers/ doctors promptly if you believe your condition is worsening  02/26/20: -- discussed current clinical condition with patient; confirms no current clinical concerns --  confirmed no concerns around medications: encouraged completion of medication review at time of next outreach  03/07/20: -- reviewed recent patient reported daily weights at home: patient reports stable weights consistently around "180" lbs -- reinforced previously provided education around importance of self- management of CHF at home/ daily weight and symptom monitoring along with corresponding action plan for development of signs/ symptoms: encouraged patient to review previously and newly provided educational material- patient requires ongoing reinforcement  -- medication review completed with patient; no concerns identified; patient denies medication concerns -- reiterated importance of smoking cessation: patient not ready to stop smoking -- discussed importance of moderation with alcohol intake over holidays and encouraged patient to discuss alcohol intake with care providers -- sent [facility] CM note/ care plan to patient's PCP as quarterly update/ summary  04/01/20: -- I am glad that the weights you have reported are stable: today, you reported weights consistently around "172-273" lbs with weight today of 173 lbs -- please make every effort to weigh yourself daily at home upon waking: it is important that you weigh yourself at the same general time each day, upon waking and getting out of bed -- continue reviewing the information we have discussed about signs/ symptoms that you are retaining fluid: follow the action plan we have discussed and notify your care providers right away if you develop these signs/ symptoms -- great job managing your medications- keep up the great work- if you have questions or concerns about your medications, please promptly contact your care providers -- please continue to try and decrease or stop smoking  -- please continue to follow a heart healthy, low salt diet: this is very important to help prevent you from retaining fluid -- keep a calendar of your doctor  appointments so you don't miss any of them: I have placed a [facility] CM 2022 calendar book in the mail to you to keep up with your appointments; this calendar also has a section where you can keep track of your daily weights and how you are feeling every day at home: it is very important that you write your weights down on paper every day       DASH Eating Plan DASH stands for "Dietary Approaches to Stop Hypertension." The DASH eating plan is a healthy eating plan that has been shown to reduce high blood pressure (hypertension). It may also  reduce your risk for type 2 diabetes, heart disease, and stroke. The DASH eating plan may also help with weight loss. What are tips for following this plan?  General guidelines  Avoid eating more than 2,300 mg (milligrams) of salt (sodium) a day. If you have hypertension, you may need to reduce your sodium intake to 1,500 mg a day.  Limit alcohol intake to no more than 1 drink a day for nonpregnant women and 2 drinks a day for men. One drink equals 12 oz of beer, 5 oz of wine, or 1 oz of hard liquor.  Work with your health care provider to maintain a healthy body weight or to lose weight. Ask what an ideal weight is for you.  Get at least 30 minutes of exercise that causes your heart to beat faster (aerobic exercise) most days of the week. Activities may include walking, swimming, or biking.  Work with your health care provider or diet and nutrition specialist (dietitian) to adjust your eating plan to your individual calorie needs. Reading food labels   Check food labels for the amount of sodium per serving. Choose foods with less than 5 percent of the Daily Value of sodium. Generally, foods with less than 300 mg of sodium per serving fit into this eating plan.  To find whole grains, look for the word "whole" as the first word in the ingredient list. Shopping  Buy products labeled as "low-sodium" or "no salt added."  Buy fresh foods. Avoid canned foods  and premade or frozen meals. Cooking  Avoid adding salt when cooking. Use salt-free seasonings or herbs instead of table salt or sea salt. Check with your health care provider or pharmacist before using salt substitutes.  Do not fry foods. Cook foods using healthy methods such as baking, boiling, grilling, and broiling instead.  Cook with heart-healthy oils, such as olive, canola, soybean, or sunflower oil. Meal planning  Eat a balanced diet that includes: ? 5 or more servings of fruits and vegetables each day. At each meal, try to fill half of your plate with fruits and vegetables. ? Up to 6-8 servings of whole grains each day. ? Less than 6 oz of lean meat, poultry, or fish each day. A 3-oz serving of meat is about the same size as a deck of cards. One egg equals 1 oz. ? 2 servings of low-fat dairy each day. ? A serving of nuts, seeds, or beans 5 times each week. ? Heart-healthy fats. Healthy fats called Omega-3 fatty acids are found in foods such as flaxseeds and coldwater fish, like sardines, salmon, and mackerel.  Limit how much you eat of the following: ? Canned or prepackaged foods. ? Food that is high in trans fat, such as fried foods. ? Food that is high in saturated fat, such as fatty meat. ? Sweets, desserts, sugary drinks, and other foods with added sugar. ? Full-fat dairy products.  Do not salt foods before eating.  Try to eat at least 2 vegetarian meals each week.  Eat more home-cooked food and less restaurant, buffet, and fast food.  When eating at a restaurant, ask that your food be prepared with less salt or no salt, if possible. What foods are recommended? The items listed may not be a complete list. Talk with your dietitian about what dietary choices are best for you. Grains Whole-grain or whole-wheat bread. Whole-grain or whole-wheat pasta. Brown rice. [place]. Bulgur. Whole-grain and low-sodium cereals. Pita bread. Low-fat, low-sodium crackers.  Whole-wheat flour  tortillas. Vegetables Fresh or frozen vegetables (raw, steamed, roasted, or grilled). Low-sodium or reduced-sodium tomato and vegetable juice. Low-sodium or reduced-sodium tomato sauce and tomato paste. Low-sodium or reduced-sodium canned vegetables. Fruits All fresh, dried, or frozen fruit. Canned fruit in natural juice (without added sugar). Meat and other protein foods Skinless chicken or [place]. Ground chicken or [place]. Pork with fat trimmed off. Fish and seafood. Egg whites. Dried beans, peas, or lentils. Unsalted nuts, nut butters, and seeds. Unsalted canned beans. Lean cuts of beef with fat trimmed off. Low-sodium, lean deli meat. Dairy Low-fat (1%) or fat-free (skim) milk. Fat-free, low-fat, or reduced-fat cheeses. Nonfat, low-sodium ricotta or cottage cheese. Low-fat or nonfat yogurt. Low-fat, low-sodium cheese. Fats and oils Soft margarine without trans fats. Vegetable oil. Low-fat, reduced-fat, or light mayonnaise and salad dressings (reduced-sodium). Canola, safflower, olive, soybean, and sunflower oils. Avocado. Seasoning and other foods Herbs. Spices. Seasoning mixes without salt. Unsalted popcorn and pretzels. Fat-free sweets. What foods are not recommended? The items listed may not be a complete list. Talk with your dietitian about what dietary choices are best for you. Grains Baked goods made with fat, such as croissants, muffins, or some breads. Dry pasta or rice meal packs. Vegetables Creamed or fried vegetables. Vegetables in a cheese sauce. Regular canned vegetables (not low-sodium or reduced-sodium). Regular canned tomato sauce and paste (not low-sodium or reduced-sodium). Regular tomato and vegetable juice (not low-sodium or reduced-sodium). Angie Fava. Olives. Fruits Canned fruit in a light or heavy syrup. Fried fruit. Fruit in cream or butter sauce. Meat and other protein foods Fatty cuts of meat. Ribs. Fried meat. Berniece Salines. Sausage. Bologna and other  processed lunch meats. Salami. Fatback. Hotdogs. Bratwurst. Salted nuts and seeds. Canned beans with added salt. Canned or smoked fish. Whole eggs or egg yolks. Chicken or [place] with skin. Dairy Whole or 2% milk, cream, and half-and-half. Whole or full-fat cream cheese. Whole-fat or sweetened yogurt. Full-fat cheese. Nondairy creamers. Whipped toppings. Processed cheese and cheese spreads. Fats and oils Butter. Stick margarine. Lard. Shortening. Ghee. Bacon fat. Tropical oils, such as coconut, palm kernel, or palm oil. Seasoning and other foods Salted popcorn and pretzels. Onion salt, garlic salt, seasoned salt, table salt, and sea salt. Worcestershire sauce. Tartar sauce. Barbecue sauce. Teriyaki sauce. Soy sauce, including reduced-sodium. Steak sauce. Canned and packaged gravies. Fish sauce. Oyster sauce. Cocktail sauce. Horseradish that you find on the shelf. Ketchup. Mustard. Meat flavorings and tenderizers. Bouillon cubes. Hot sauce and Tabasco sauce. Premade or packaged marinades. Premade or packaged taco seasonings. Relishes. Regular salad dressings. Where to find more information:  National Heart, Lung, and [place]: https://wilson-eaton.com/  American Heart Association: www.heart.org Summary  The DASH eating plan is a healthy eating plan that has been shown to reduce high blood pressure (hypertension). It may also reduce your risk for type 2 diabetes, heart disease, and stroke.  With the DASH eating plan, you should limit salt (sodium) intake to 2,300 mg a day. If you have hypertension, you may need to reduce your sodium intake to 1,500 mg a day.  When on the DASH eating plan, aim to eat more fresh fruits and vegetables, whole grains, lean proteins, low-fat dairy, and heart-healthy fats.  Work with your health care provider or diet and nutrition specialist (dietitian) to adjust your eating plan to your individual calorie needs. This information is not intended to replace advice given to  you by your health care provider. Make sure you discuss any questions you have with your health care provider.  Document Revised: 02/22/2017 Document Reviewed: 03/05/2016 Elsevier Patient Education  [place].  Low-Sodium Eating Plan Sodium, which is an element that makes up salt, helps you maintain a healthy balance of fluids in your body. Too much sodium can increase your blood pressure and cause fluid and waste to be held in your body. Your health care provider or dietitian may recommend following this plan if you have high blood pressure (hypertension), kidney disease, liver disease, or heart failure. Eating less sodium can help lower your blood pressure, reduce swelling, and protect your heart, liver, and kidneys. What are tips for following this plan? General guidelines  Most people on this plan should limit their sodium intake to 1,500-2,000 mg (milligrams) of sodium each day. Reading food labels   The Nutrition Facts label lists the amount of sodium in one serving of the food. If you eat more than one serving, you must multiply the listed amount of sodium by the number of servings.  Choose foods with less than 140 mg of sodium per serving.  Avoid foods with 300 mg of sodium or more per serving. Shopping  Look for lower-sodium products, often labeled as "low-sodium" or "no salt added."  Always check the sodium content even if foods are labeled as "unsalted" or "no salt added".  Buy fresh foods. ? Avoid canned foods and premade or frozen meals. ? Avoid canned, cured, or processed meats  Buy breads that have less than 80 mg of sodium per slice. Cooking  Eat more home-cooked food and less restaurant, buffet, and fast food.  Avoid adding salt when cooking. Use salt-free seasonings or herbs instead of table salt or sea salt. Check with your health care provider or pharmacist before using salt substitutes.  Cook with plant-based oils, such as canola, sunflower, or olive  oil. Meal planning  When eating at a restaurant, ask that your food be prepared with less salt or no salt, if possible.  Avoid foods that contain MSG (monosodium glutamate). MSG is sometimes added to [place] food, bouillon, and some canned foods. What foods are recommended? The items listed may not be a complete list. Talk with your dietitian about what dietary choices are best for you. Grains Low-sodium cereals, including oats, puffed wheat and rice, and shredded wheat. Low-sodium crackers. Unsalted rice. Unsalted pasta. Low-sodium bread. Whole-grain breads and whole-grain pasta. Vegetables Fresh or frozen vegetables. "No salt added" canned vegetables. "No salt added" tomato sauce and paste. Low-sodium or reduced-sodium tomato and vegetable juice. Fruits Fresh, frozen, or canned fruit. Fruit juice. Meats and other protein foods Fresh or frozen (no salt added) meat, poultry, seafood, and fish. Low-sodium canned tuna and salmon. Unsalted nuts. Dried peas, beans, and lentils without added salt. Unsalted canned beans. Eggs. Unsalted nut butters. Dairy Milk. Soy milk. Cheese that is naturally low in sodium, such as ricotta cheese, fresh mozzarella, or Swiss cheese Low-sodium or reduced-sodium cheese. Cream cheese. Yogurt. Fats and oils Unsalted butter. Unsalted margarine with no trans fat. Vegetable oils such as canola or olive oils. Seasonings and other foods Fresh and dried herbs and spices. Salt-free seasonings. Low-sodium mustard and ketchup. Sodium-free salad dressing. Sodium-free light mayonnaise. Fresh or refrigerated horseradish. Lemon juice. Vinegar. Homemade, reduced-sodium, or low-sodium soups. Unsalted popcorn and pretzels. Low-salt or salt-free chips. What foods are not recommended? The items listed may not be a complete list. Talk with your dietitian about what dietary choices are best for you. Grains Instant hot cereals. Bread stuffing, pancake, and biscuit mixes. Croutons.  Seasoned  rice or pasta mixes. Noodle soup cups. Boxed or frozen macaroni and cheese. Regular salted crackers. Self-rising flour. Vegetables Sauerkraut, pickled vegetables, and relishes. Olives. [place] fries. Onion rings. Regular canned vegetables (not low-sodium or reduced-sodium). Regular canned tomato sauce and paste (not low-sodium or reduced-sodium). Regular tomato and vegetable juice (not low-sodium or reduced-sodium). Frozen vegetables in sauces. Meats and other protein foods Meat or fish that is salted, canned, smoked, spiced, or pickled. Bacon, ham, sausage, hotdogs, corned beef, chipped beef, packaged lunch meats, salt pork, jerky, pickled herring, anchovies, regular canned tuna, sardines, salted nuts. Dairy Processed cheese and cheese spreads. Cheese curds. Blue cheese. Feta cheese. String cheese. Regular cottage cheese. Buttermilk. Canned milk. Fats and oils Salted butter. Regular margarine. Ghee. Bacon fat. Seasonings and other foods Onion salt, garlic salt, seasoned salt, table salt, and sea salt. Canned and packaged gravies. Worcestershire sauce. Tartar sauce. Barbecue sauce. Teriyaki sauce. Soy sauce, including reduced-sodium. Steak sauce. Fish sauce. Oyster sauce. Cocktail sauce. Horseradish that you find on the shelf. Regular ketchup and mustard. Meat flavorings and tenderizers. Bouillon cubes. Hot sauce and Tabasco sauce. Premade or packaged marinades. Premade or packaged taco seasonings. Relishes. Regular salad dressings. Salsa. Potato and tortilla chips. Corn chips and puffs. Salted popcorn and pretzels. Canned or dried soups. Pizza. Frozen entrees and pot pies. Summary  Eating less sodium can help lower your blood pressure, reduce swelling, and protect your heart, liver, and kidneys.  Most people on this plan should limit their sodium intake to 1,500-2,000 mg (milligrams) of sodium each day.  Canned, boxed, and frozen foods are high in sodium. Restaurant foods, fast foods, and  pizza are also very high in sodium. You also get sodium by adding salt to food.  Try to cook at home, eat more fresh fruits and vegetables, and eat less fast food, canned, processed, or prepared foods. This information is not intended to replace advice given to you by your health care provider. Make sure you discuss any questions you have with your health care provider. Document Revised: 02/22/2017 Document Reviewed: 03/05/2016 Elsevier Patient Education  2020 [employer].

## 2020-04-13 NOTE — Progress Notes (Signed)
Patient ID: Travis [name], male    DOB: [birth year], 65 y.o.   MRN: [id number]  HPI  Travis [name] is a 65 y/o male with a history of osteoarthritis, hyperlipidemia, gout, HTN, GI bleed, DM, CAD, anemia, asthma, previous alcohol use, current tobacco use and chronic heart failure.  Echo report from 11/09/19 reviewed and showed an EF of 45-50% along with mild LVH. Echo report from 06/04/19 reviewed and showed an EF of 35-40% along with mild LVH. Reviewed last echo done 08/08/16 which showed an EF of 35-40%. This is an improvement from his last echo which was done 01/26/15 and showed an EF of 25-30% along with trivial Travis/ TR. EF had dropped from 2015 when it was 45-50%.   Had a cardiac catheterization done 01/28/15 which showed 10% stenosis in Prox RCA to Mid RCA and 30% stenosis in Prox LAD to Mid LAD which is not much different from previous angiography. Cardiomyopathy felt to be due to previous alcohol use.   Patient was admitted to the hospital on 11/20/19 and was discharged on 11/26/19 after receiving IV antibiotics and sepsis workup.  for Sepsis Was in the ED 11/08/19 due to scapula pain along with short-term vision loss. Evaluated and released the next day.   He presents today for a follow-up visit with a chief complaint of intermittent difficulty sleeping. He describes this as having been present for several years. He has occasional back pain and endorses weight loss. He denies any dizziness, abdominal distention, palpitations, pedal edema, chest pain, shortness of breath, cough or fatigue.   Says that he recently came down "with something" and went to [facility] ED and [facility] but left after finding out that both places had wait times of >8 hours. Says that he bought OTC Nyquil and now feels better and back to normal.   Past Medical History:  Diagnosis Date  . Alcohol abuse   . 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 05/2019 Echo: EF 35-40%; b. 10/2019 Ehco: EF 45-50%.  . Allergy   . Asthma   .  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 Chronic anemia   . Clotting disorder ([place])   . Diabetes mellitus, type 2 ([place])    pt reports his DM is gone  . GI bleed    15 years ago  . Gout   . Hemoptysis    secondary to pulmonary edema  . HFrEF (heart failure with reduced ejection fraction) ([place])    a. 12/2010 Echo: EF 20-25%; b 08/2013 Echo: EF 45-50%; c. 01/2015 Echo: EF 25-30%; d. 07/2016 Echo: EF 35-40%; e. 05/2019 Echo: EF 35-40%; d. 10/2019 Echo: EF 45-50%, mild LVH, mild red RV fxn, mildl dil RA, Triv Travis. Mild to mod Ao Sclerosis w/o stenosis.  . Hyperlipidemia   . Hypertension   . Nonischemic cardiomyopathy ([place])   . Nonobstructive Coronary artery disease    a. 01/2015 Cath: LM nl, LAD 30p/m, LCX nl, RCA 10p/m, RPDA min irregs.  . Osteoarthritis   . Pneumonia   . TIA (transient ischemic attack)   . Tobacco abuse   . Vision loss    peripherial vision only left eye.Central Retinal  artery occusion    Past Surgical History:  Procedure Laterality Date  . BIOPSY  11/25/2019   Procedure: BIOPSY;  Surgeon: Ladene Artist, MD;  Location: [facility];  Service: Endoscopy;;  . CARDIAC CATHETERIZATION    . CARDIAC CATHETERIZATION N/A 01/28/2015   Procedure: Left Heart Cath  and Coronary Angiography;  Surgeon: Wellington Hampshire, MD;  Location: [place] [facility];  Service: Cardiovascular;  Laterality: N/A;  . COLONOSCOPY    . ESOPHAGOGASTRODUODENOSCOPY (EGD) WITH PROPOFOL N/A 11/25/2019   Procedure: ESOPHAGOGASTRODUODENOSCOPY (EGD) WITH PROPOFOL;  Surgeon: Ladene Artist, MD;  Location[facility] ENDOSCOPY;  Service: Endoscopy;  Laterality: N/A;  . HIP ARTHROPLASTY Right 03/15/2013   Procedure: ARTHROPLASTY BIPOLAR HIP;  Surgeon: [name], MD;  Location: [place];  Service: Orthopedics;  Laterality: Right;  . TOTAL SHOULDER ARTHROPLASTY Right 09/01/2015   Procedure: RIGHT TOTAL SHOULDER ARTHROPLASTY;  Surgeon: [name], MD;  Location: [place];  Service: Orthopedics;   Laterality: Right;   Family History  Problem Relation Age of Onset  . Diabetes Mother   . Hypertension Mother   . Diabetes Father   . Hypertension Father   . Diabetes Sister   . Dementia Brother   . Cancer Neg Hx   . Heart disease Neg Hx   . Stroke Neg Hx   . Colon cancer Neg Hx   . Esophageal cancer Neg Hx   . Rectal cancer Neg Hx   . Stomach cancer Neg Hx    Social History   Tobacco Use  . Smoking status: Current Every Day Smoker    Packs/day: 0.50    Years: 30.00    Pack years: 15.00    Types: Cigarettes  . Smokeless tobacco: Never Used  Substance Use Topics  . Alcohol use: Yes    Alcohol/week: 1.0 standard drink    Types: 1 Glasses of wine per week    Comment: occasionally   No Known Allergies Past Medical History:  Diagnosis Date  . Alcohol abuse   . 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 05/2019 Echo: EF 35-40%; b. 10/2019 Ehco: EF 45-50%.  . Allergy   . Asthma   . 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 Chronic anemia   . Clotting disorder ([place])   . Diabetes mellitus, type 2 ([place])    pt reports his DM is gone  . GI bleed    15 years ago  . Gout   . Hemoptysis    secondary to pulmonary edema  . HFrEF (heart failure with reduced ejection fraction) ([place])    a. 12/2010 Echo: EF 20-25%; b 08/2013 Echo: EF 45-50%; c. 01/2015 Echo: EF 25-30%; d. 07/2016 Echo: EF 35-40%; e. 05/2019 Echo: EF 35-40%; d. 10/2019 Echo: EF 45-50%, mild LVH, mild red RV fxn, mildl dil RA, Triv Travis. Mild to mod Ao Sclerosis w/o stenosis.  . Hyperlipidemia   . Hypertension   . Nonischemic cardiomyopathy ([place])   . Nonobstructive Coronary artery disease    a. 01/2015 Cath: LM nl, LAD 30p/m, LCX nl, RCA 10p/m, RPDA min irregs.  . Osteoarthritis   . Pneumonia   . TIA (transient ischemic attack)   . Tobacco abuse   . Vision loss    peripherial vision only left eye.Central Retinal  artery occusion    Past Surgical History:  Procedure Laterality Date  . BIOPSY   11/25/2019   Procedure: BIOPSY;  Surgeon: Ladene Artist, MD;  Location: [facility];  Service: Endoscopy;;  . CARDIAC CATHETERIZATION    . CARDIAC CATHETERIZATION N/A 01/28/2015   Procedure: Left Heart Cath and Coronary Angiography;  Surgeon: Wellington Hampshire, MD;  Location: [facility];  Service: Cardiovascular;  Laterality: N/A;  . COLONOSCOPY    . ESOPHAGOGASTRODUODENOSCOPY (EGD) WITH PROPOFOL N/A 11/25/2019  Procedure: ESOPHAGOGASTRODUODENOSCOPY (EGD) WITH PROPOFOL;  Surgeon: Ladene Artist, MD;  Location[facility]OSCOPY;  Service: Endoscopy;  Laterality: N/A;  . HIP ARTHROPLASTY Right 03/15/2013   Procedure: ARTHROPLASTY BIPOLAR HIP;  Surgeon: [name], MD;  Location: [facility];  Service: Orthopedics;  Laterality: Right;  . TOTAL SHOULDER ARTHROPLASTY Right 09/01/2015   Procedure: RIGHT TOTAL SHOULDER ARTHROPLASTY;  Surgeon: [name], MD;  Location: [place];  Service: Orthopedics;  Laterality: Right;   Family History  Problem Relation Age of Onset  . Diabetes Mother   . Hypertension Mother   . Diabetes Father   . Hypertension Father   . Diabetes Sister   . Dementia Brother   . Cancer Neg Hx   . Heart disease Neg Hx   . Stroke Neg Hx   . Colon cancer Neg Hx   . Esophageal cancer Neg Hx   . Rectal cancer Neg Hx   . Stomach cancer Neg Hx    Social History   Tobacco Use  . Smoking status: Current Every Day Smoker    Packs/day: 0.50    Years: 30.00    Pack years: 15.00    Types: Cigarettes  . Smokeless tobacco: Never Used  Substance Use Topics  . Alcohol use: Yes    Alcohol/week: 1.0 standard drink    Types: 1 Glasses of wine per week    Comment: occasionally   No Known Allergies  Prior to Admission medications   Medication Sig Start Date End Date Taking? Authorizing Provider  albuterol (PROVENTIL HFA;VENTOLIN HFA) 108 (90 BASE) MCG/ACT inhaler Inhale 2 puffs into the lungs every 6 (six) hours as needed for wheezing or shortness of breath. Take 2  puffs every 5-10 minutes up to 6 puffs total over 15 minutes when needed.  Use with a spacer. 01/24/15  Yes Ahmed Prima, MD  allopurinol (ZYLOPRIM) 100 MG tablet Take 2 tablets (200 mg total) by mouth daily. 01/23/19  Yes [name], NP  aspirin EC 325 MG tablet Take 162.5 mg by mouth daily.   Yes [provider]  atorvastatin (LIPITOR) 20 MG tablet Take 1 tablet (20 mg total) by mouth daily. 02/09/20 05/09/20 Yes Sahalie Beth, Otila Kluver A, FNP  carvedilol (COREG) 12.5 MG tablet Take 6.25 mg by mouth 2 (two) times daily with a meal.   Yes [provider]  cetirizine (ZYRTEC) 10 MG tablet TAKE 1 TABLET BY MOUTH DAILY 07/22/19  Yes [name], NP  fenofibrate (TRICOR) 145 MG tablet TAKE 1 TABLET BY MOUTH ONCE A DAY 12/23/19  Yes [name], NP  ferrous sulfate 325 (65 FE) MG tablet Take 1 tablet (325 mg total) by mouth daily with breakfast. 11/27/19 02/25/20 Yes Hall, Carole N, DO  fluticasone (FLONASE) 50 MCG/ACT nasal spray Place 1 spray into both nostrils 2 (two) times daily. 03/29/16  Yes Cuthriell, Charline Bills, PA-C  Multiple Vitamin (MULTIVITAMIN WITH MINERALS) TABS tablet Take 1 tablet by mouth daily. Men's One a Day   Yes [provider]  Omega-3 Fatty Acids (FISH OIL PO) Take 1 capsule by mouth daily.   Yes [provider]  pantoprazole (PROTONIX) 40 MG tablet Take 1 tablet (40 mg total) by mouth daily. 12/11/19 03/10/20 Yes Hall, Carole N, DO  sacubitril-valsartan (ENTRESTO) 97-103 MG Take 0.5 tablets by mouth 2 (two) times daily.   Yes [provider]  spironolactone (ALDACTONE) 25 MG tablet Take 0.5 tablets (12.5 mg total) by mouth daily. 12/01/19 02/29/20 Yes Alisa Graff, FNP  triamcinolone (KENALOG) 0.1 % APPLY TO AFFECTED AREAS TWICE A DAY AS NEEDED. [place], OR UNDERARMS 02/15/20  Yes [name], NP    Review of Systems  Constitutional: Negative for appetite change and fatigue.  HENT: Negative for congestion, postnasal  drip and sore throat.   Eyes: Negative.   Respiratory: Negative for cough, chest tightness and shortness of breath.   Cardiovascular: Negative for chest pain, palpitations and leg swelling.  Gastrointestinal: Negative for abdominal distention and abdominal pain.  Endocrine: Negative.   Genitourinary: Negative.   Musculoskeletal: Positive for back pain (Using lidocaine patches for pain). Negative for neck pain.  Skin: Negative.   Allergic/Immunologic: Negative.   Neurological: Negative for dizziness, light-headedness and headaches.  Hematological: Negative for adenopathy. Does not bruise/bleed easily.  Psychiatric/Behavioral: Positive for sleep disturbance (sleeping on 3 pillows). Negative for dysphoric mood and suicidal ideas. The patient is not nervous/anxious.    Vitals:   04/14/20 1149  BP: 133/84  Pulse: (!) 106  Resp: 18  SpO2: 100%  Weight: 174 lb (78.9 kg)  Height: '6\' 4"'$  (1.93 m)   Wt Readings from Last 3 Encounters:  04/14/20 174 lb (78.9 kg)  02/24/20 184 lb (83.5 kg)  02/09/20 184 lb 2 oz (83.5 kg)   Lab Results  Component Value Date   CREATININE 1.58 (H) 01/13/2020   CREATININE 1.48 (H) 12/31/2019   CREATININE 1.53 (H) 12/31/2019    Physical Exam Vitals and nursing note reviewed.  Constitutional:      Appearance: He is well-developed.  HENT:     Head: Normocephalic and atraumatic.  Neck:     Vascular: No JVD.  Cardiovascular:     Rate and Rhythm: Regular rhythm. Tachycardia present.  Pulmonary:     Effort: Pulmonary effort is normal.     Breath sounds: No wheezing or rales.  Abdominal:     General: There is no distension.     Palpations: Abdomen is soft.     Tenderness: There is no abdominal tenderness.  Musculoskeletal:        General: No tenderness.     Cervical back: Normal range of motion and neck supple.  Skin:    General: Skin is warm and dry.  Neurological:     Mental Status: He is alert and oriented to person, place, and time.   Psychiatric:        Behavior: Behavior normal.        Thought Content: Thought content normal.      Assessment & Plan:  1: Chronic heart failure with reduced ejection fraction- - NYHA class I - euvolemic today - weighing daily and call for an overnight weight gain of >2 pounds/weekly weight gain of >5 pounds.  - weight down 10 lbs since last visit here 2 months ago - trying to not add salt to his food  - Drinking approximately 64 oz of fluid daily - had telemedicine visit with cardiology Sharolyn Douglas) 02/24/20 - had increased entresto at last visit but patient says that cardiology told him not to; he's completely unsure of what dose he's taking of entresto or carvedilol and did not bring bottles with him - tachycardic today; says that he took his medications already today but that transportation to his appointments is stressful; says that he needs to look into the bus transit system - says that he's received his covid vaccines but still has to get his booster   2: HTN- - BP looks good today - BMP from 01/13/20 reviewed and  shows sodium 134, potassium 4.7, creatinine 1.58 and GFR 53 - will not recheck BMP today since patient has no idea of what dose of anything he's taking - saw his PCP Garnette Gunner) 12/07/19   3: Tobacco use- - continues to smoke about 1 ppd of cigarettes  - complete cessation discussed for 3 minutes with him  4: Alcohol use- - Reports that he drinks "a little bit" every now and then - unable to quantify what "a little bit" is but says that he drank some a few days ago when it snowed   Patient did not bring his medications nor a list. Each medication was verbally reviewed with the patient and he was encouraged to bring the bottles to every visit to confirm accuracy of list. Advised patient to call back after he gets home to go over medications.   Return in 6 months or sooner for any questions/problems before then.

## 2020-04-14 ENCOUNTER — Encounter: Payer: Self-pay | Admitting: Family

## 2020-04-14 ENCOUNTER — Ambulatory Visit: Payer: PRIVATE HEALTH INSURANCE | Attending: Family | Admitting: Family

## 2020-04-14 ENCOUNTER — Other Ambulatory Visit: Payer: Self-pay

## 2020-04-14 VITALS — BP 133/84 | HR 106 | Resp 18 | Ht 76.0 in | Wt 174.0 lb

## 2020-04-14 DIAGNOSIS — Z7289 Other problems related to lifestyle: Secondary | ICD-10-CM | POA: Diagnosis not present

## 2020-04-14 DIAGNOSIS — I2510 Atherosclerotic heart disease of native coronary artery without angina pectoris: Secondary | ICD-10-CM | POA: Insufficient documentation

## 2020-04-14 DIAGNOSIS — I5020 Unspecified systolic (congestive) heart failure: Secondary | ICD-10-CM | POA: Diagnosis not present

## 2020-04-14 DIAGNOSIS — Z7982 Long term (current) use of aspirin: Secondary | ICD-10-CM | POA: Insufficient documentation

## 2020-04-14 DIAGNOSIS — Z720 Tobacco use: Secondary | ICD-10-CM

## 2020-04-14 DIAGNOSIS — I110 Hypertensive heart disease with heart failure: Secondary | ICD-10-CM | POA: Insufficient documentation

## 2020-04-14 DIAGNOSIS — I10 Essential (primary) hypertension: Secondary | ICD-10-CM

## 2020-04-14 DIAGNOSIS — J45909 Unspecified asthma, uncomplicated: Secondary | ICD-10-CM | POA: Insufficient documentation

## 2020-04-14 DIAGNOSIS — Z789 Other specified health status: Secondary | ICD-10-CM

## 2020-04-14 DIAGNOSIS — F17210 Nicotine dependence, cigarettes, uncomplicated: Secondary | ICD-10-CM | POA: Diagnosis not present

## 2020-04-14 DIAGNOSIS — I426 Alcoholic cardiomyopathy: Secondary | ICD-10-CM | POA: Diagnosis not present

## 2020-04-14 DIAGNOSIS — E785 Hyperlipidemia, unspecified: Secondary | ICD-10-CM | POA: Diagnosis not present

## 2020-04-14 DIAGNOSIS — E119 Type 2 diabetes mellitus without complications: Secondary | ICD-10-CM | POA: Diagnosis not present

## 2020-04-14 NOTE — Patient Instructions (Signed)
Continue weighing daily and call for an overnight weight gain of > 2 pounds or a weekly weight gain of >5 pounds. 

## 2020-04-15 ENCOUNTER — Other Ambulatory Visit: Payer: Self-pay | Admitting: Family

## 2020-04-15 ENCOUNTER — Ambulatory Visit: Payer: PRIVATE HEALTH INSURANCE | Admitting: Family

## 2020-04-15 ENCOUNTER — Other Ambulatory Visit: Payer: Self-pay | Admitting: *Deleted

## 2020-04-15 MED ORDER — SACUBITRIL-VALSARTAN 24-26 MG PO TABS: 1.0000 | ORAL_TABLET | Freq: Two times a day (BID) | ORAL | 3 refills | Status: DC

## 2020-04-15 NOTE — Patient Outreach (Signed)
[facility]) Care Management [place] Coordination  04/15/2020  Travis [name] [birth year] [id number]  [place] Coordination re:  [name], referred to this [facility] RN CM 01/26/20 on re-assignment from previous [place]; original referral received 11/26/19 by [facility] Liaison after patient experienced hospital admission August 27- September 2, 208fr sepsis/ hypotension/ anemia. Patient was discharged home to self-care without home health services in place. Patient has history including, but not limited to, non-ischemic cardiomyopathy; CHF; CKD-III; HTN/ HLD; DM-II (A1-C 7.3 on 11/09/19); previous TIA; tobacco and ETOH use.  [facility] from [facility] 04/13/20 to begin transition of pending patient care coordination team; sent note via secure messaging in EHR to assigned practice RN CM for future/ ongoing outreach.  LOneta Rack RN, BSN, CIntel CorporationTMinimally Invasive Surgery Center Of New EnglandCare Management  [phone]

## 2020-04-15 NOTE — Progress Notes (Signed)
Patient called back to update entresto dose.

## 2020-04-27 ENCOUNTER — Ambulatory Visit: Payer: Self-pay

## 2020-04-28 NOTE — Chronic Care Management (AMB) (Signed)
Care Management    RN Visit Note  04/28/2020 Name: Travis [name] MRN: [id number] DOB: [birth year]  Subjective: Travis [name] is a 65 y.o. year old male who is a primary care patient of Travis Fenton, NP. The care management team was consulted for assistance with disease management and care coordination needs.    Engaged with patient by telephone for initial visit in response to provider referral for case management and/or care coordination services.   Consent to Services:   [name] was given information about Care Management services today including:  1. Care Management services includes personalized support from designated clinical staff supervised by his physician, including individualized plan of care and coordination with other care providers 2. 24/7 contact phone numbers for assistance for urgent and routine care needs. 3. The patient may stop case management services at any time by phone call to the office staff.  Patient agreed to services and consent obtained.   Assessment: Review of patient past medical history, allergies, medications, health status, including review of consultants reports, laboratory and other test data, was performed as part of comprehensive evaluation and provision of chronic care management services.   SDOH (Social Determinants of Health) assessments and interventions performed:    Care Plan  No Known Allergies  Outpatient Encounter Medications as of 04/27/2020  Medication Sig Note  . albuterol (PROVENTIL HFA;VENTOLIN HFA) 108 (90 BASE) MCG/ACT inhaler Inhale 2 puffs into the lungs every 6 (six) hours as needed for wheezing or shortness of breath. Take 2 puffs every 5-10 minutes up to 6 puffs total over 15 minutes when needed.  Use with a spacer. 03/07/2020: 03/07/20: Patient reports has not needed recently   . allopurinol (ZYLOPRIM) 100 MG tablet Take 2 tablets (200 mg total) by mouth daily. 03/07/2020: Patient reports taking one pill BID   . aspirin  EC 325 MG tablet Take 162.5 mg by mouth daily.   Travis [name] atorvastatin (LIPITOR) 20 MG tablet Take 1 tablet (20 mg total) by mouth daily.   . carvedilol (COREG) 12.5 MG tablet Take 6.25 mg by mouth 2 (two) times daily with a meal.   . cetirizine (ZYRTEC) 10 MG tablet TAKE 1 TABLET BY MOUTH DAILY   . fenofibrate (TRICOR) 145 MG tablet TAKE 1 TABLET BY MOUTH ONCE A DAY   . fluticasone (FLONASE) 50 MCG/ACT nasal spray Place 1 spray into both nostrils 2 (two) times daily.   . Multiple Vitamin (MULTIVITAMIN WITH MINERALS) TABS tablet Take 1 tablet by mouth daily. Men's One a Day   . Omega-3 Fatty Acids (FISH OIL PO) Take 1 capsule by mouth daily.   . sacubitril-valsartan (ENTRESTO) 24-26 MG Take 1 tablet by mouth 2 (two) times daily.   Travis [name] triamcinolone (KENALOG) 0.1 % APPLY TO AFFECTED AREAS TWICE A DAY AS NEEDED. AVOID FACE,GROIN, OR UNDERARMS   . ferrous sulfate 325 (65 FE) MG tablet Take 1 tablet (325 mg total) by mouth daily with breakfast. 03/07/2020: Reports taking QD- to be refilled soon per patient report  . pantoprazole (PROTONIX) 40 MG tablet Take 1 tablet (40 mg total) by mouth daily.   Travis [name] spironolactone (ALDACTONE) 25 MG tablet Take 0.5 tablets (12.5 mg total) by mouth daily. 03/07/2020: 03/07/20: Patient reports has and is taking as prescribed   No facility-administered encounter medications on file as of 04/27/2020.    Patient Active Problem List   Diagnosis Date Noted  . Alcohol use 11/20/2019  . CKD (chronic kidney disease), stage III ([place]) 11/20/2019  .  TIA (transient ischemic attack) 11/08/2019  . Osteoarthritis 05/09/2016  . COPD (chronic obstructive pulmonary disease) ([place]) 05/09/2016  . Essential hypertension 04/18/2015  . Literacy level of illiterate 06/04/2014  . 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 Cardiomyopathy, nonischemic ([place]) 03/15/2013  . Chronic systolic heart failure ([place]) 01/05/2011  . Tobacco use 01/05/2011  . Diabetes ([place]) 11/30/2008  . HLD  (hyperlipidemia) 11/30/2008  . Gout 11/30/2008    Conditions to be addressed/monitored: CHF  Care Plan : RNCM Heart Failure ( Adult)  Updates made by Dannielle Karvonen, RN since 04/28/2020 12:00 AM  Problem: Symptom Exacerbation (Heart Failure)   Priority: High  Long-Range Goal: Patient will minimize heart failure exacerbation   Start Date: 04/27/2020  Expected End Date: 07/22/2020  This Visit's Progress: On track  Priority: High  Current Barriers:  Travis [name] Knowledge deficit related to basic heart failure pathophysiology and self care management: Patient reports he is not weighing and recording weights daily.  . Knowledge Deficits related to heart failure medications . Literacy Barriers: patient reports his niece assists him with reading / understanding medical documentation.  Case Manager Clinical Goal(s):  Travis [name] Patient will verbalize understanding of Heart Failure Action Plan and when to call doctor . Patient will weigh daily and record (notifying MD of 3 lb weight gain over night or 5 lb in a week) Interventions:  . Collaboration with Travis Fenton, NP regarding development and update of comprehensive plan of care as evidenced by provider attestation and co-signature . Inter-disciplinary care team collaboration (see longitudinal plan of care) . Basic overview and discussion of pathophysiology of Heart Failure reviewed  . Provided verbal education on low sodium diet . Reviewed Heart Failure Action Plan in depth. . Discussed importance of daily weight and advised patient to weigh and record daily . Reviewed role of diuretics in prevention of fluid overload and management of heart failure . - health literacy screening completed or reviewed . - medication-adherence assessment completed . - rescue (action) plan reviewed . - self-awareness of signs/symptoms of worsening disease encouraged . Patient advised to scheduled COVID 19 booster . Smoking cessation discussed.  Patient Goals/Self-Care  Activities . Over the next 30 days, patient will: - Takes Heart Failure Medications as prescribed and pick up your refilled prescriptions from the pharmacy Weighs daily and record (notifying MD of 3 lb weight gain over night or 5 lb in a week) Verbalizes understanding of and follow CHF Action Plan Adheres to low sodium diet - eat more whole grains, fruits and vegetables, lean meats and healthy fats - follow rescue plan for symptoms flare-up - keep your follow up appointments with your doctors.  - watch for swelling in feet, ankles and legs every day - Will schedule appointment for COVID 19 booster.  - Continue to work on decreasing the number of cigarettes per day.  Follow Up Plan: The patient has been provided with contact information for the care management team and has been advised to call with any health related questions or concerns.  The care management team will reach out to the patient again over the next 30 days.       Plan: The patient has been provided with contact information for the care management team and has been advised to call with any health related questions or concerns.  and The care management team will reach out to the patient again over the next 30  days.  [name] RN,BSN,CCM Chronic Care Management Coordinator [phone]

## 2020-04-28 NOTE — Patient Instructions (Signed)
Visit Information  PATIENT GOALS:  Goals Addressed            This Visit's Progress   . RNCM: Protect my health       Timeframe:  Short-Term Goal Priority:  Medium Start Date:   04/27/2020                          Expected End Date: 06/22/2020                   Follow Up Date 05/25/20   - schedule appointment for vaccines needed due to my age or health Will schedule appointment for COVID 19 booster.    Why is this important?   Screening tests can find diseases early when they are easier to treat.  Your doctor or nurse will talk with you about which tests are important for you.  Getting shots for common diseases like the flu and shingles will help prevent them.     Notes:       . RNCM: Track and Manage Symptoms       Timeframe:  Long-Range Goal Priority:  High Start Date: 04/27/2020                         Expected End Date:  07/22/2020                      Follow Up Date 05/25/2020   - eat more whole grains, fruits and vegetables, lean meats and healthy fats - follow rescue plan ( heart failure action plan) if symptoms flare-up - know when to call the doctor for symptoms. Follow up with your doctor at scheduled visits.  - track symptoms and what helps feel better or worse  - Pick up your refilled prescriptions from the pharmacy. - Weigh daily and record values.  - call office if I gain more than 2 pounds in one day or 5 pounds in one week - Continue to follow a low salt diet.  - watch for swelling in feet, ankles and legs every day - Work on decreasing the number of cigarettes per day.    Why is this important?   You will be able to handle your symptoms better if you keep track of them.  Making some simple changes to your lifestyle will help.  Eating healthy is one thing you can do to take good care of yourself.    Notes:        Travis [name] was given information about Care Management services today including:  1. Care Management services include personalized support  from designated clinical staff supervised by his physician, including individualized plan of care and coordination with other care providers 2. 24/7 contact phone numbers for assistance for urgent and routine care needs. 3. The patient may stop CCM services at any time (effective at the end of the month) by phone call to the office staff.  Patient agreed to services and verbal consent obtained.   Patient verbalizes understanding of instructions provided today and agrees to view in [place].   The patient has been provided with contact information for the care management team and has been advised to call with any health related questions or concerns.  The care management team will reach out to the patient again over the next 30 days.   [name] RN,BSN,CCM Chronic Care Management Coordinator [phone]

## 2020-05-13 ENCOUNTER — Other Ambulatory Visit: Payer: Self-pay | Admitting: Internal Medicine

## 2020-05-26 ENCOUNTER — Other Ambulatory Visit: Payer: Self-pay

## 2020-05-26 ENCOUNTER — Encounter: Payer: Self-pay | Admitting: Cardiovascular Disease

## 2020-05-26 ENCOUNTER — Inpatient Hospital Stay
Admission: EM | Admit: 2020-05-26 | Discharge: 2020-05-28 | DRG: 314 | Disposition: A | Discharge status: Home / Self Care | Payer: Medicare HMO | Attending: Obstetrics and Gynecology | Admitting: Obstetrics and Gynecology

## 2020-05-26 ENCOUNTER — Ambulatory Visit (INDEPENDENT_AMBULATORY_CARE_PROVIDER_SITE_OTHER): Payer: Medicare HMO | Admitting: Cardiovascular Disease

## 2020-05-26 ENCOUNTER — Encounter: Payer: Self-pay | Admitting: Emergency Medicine

## 2020-05-26 ENCOUNTER — Emergency Department: Payer: Medicare HMO

## 2020-05-26 VITALS — BP 84/62 | HR 100 | Ht 76.0 in | Wt 169.0 lb

## 2020-05-26 DIAGNOSIS — I426 Alcoholic cardiomyopathy: Secondary | ICD-10-CM | POA: Diagnosis present

## 2020-05-26 DIAGNOSIS — G8929 Other chronic pain: Secondary | ICD-10-CM | POA: Diagnosis present

## 2020-05-26 DIAGNOSIS — I130 Hypertensive heart and chronic kidney disease with heart failure and stage 1 through stage 4 chronic kidney disease, or unspecified chronic kidney disease: Secondary | ICD-10-CM | POA: Diagnosis present

## 2020-05-26 DIAGNOSIS — T502X5A Adverse effect of carbonic-anhydrase inhibitors, benzothiadiazides and other diuretics, initial encounter: Secondary | ICD-10-CM | POA: Diagnosis present

## 2020-05-26 DIAGNOSIS — Z7289 Other problems related to lifestyle: Secondary | ICD-10-CM

## 2020-05-26 DIAGNOSIS — I5022 Chronic systolic (congestive) heart failure: Secondary | ICD-10-CM | POA: Diagnosis present

## 2020-05-26 DIAGNOSIS — I959 Hypotension, unspecified: Secondary | ICD-10-CM | POA: Diagnosis not present

## 2020-05-26 DIAGNOSIS — Z789 Other specified health status: Secondary | ICD-10-CM

## 2020-05-26 DIAGNOSIS — D631 Anemia in chronic kidney disease: Secondary | ICD-10-CM | POA: Diagnosis present

## 2020-05-26 DIAGNOSIS — I428 Other cardiomyopathies: Secondary | ICD-10-CM | POA: Diagnosis not present

## 2020-05-26 DIAGNOSIS — Z8249 Family history of ischemic heart disease and other diseases of the circulatory system: Secondary | ICD-10-CM

## 2020-05-26 DIAGNOSIS — K2100 Gastro-esophageal reflux disease with esophagitis, without bleeding: Secondary | ICD-10-CM | POA: Diagnosis present

## 2020-05-26 DIAGNOSIS — Z79899 Other long term (current) drug therapy: Secondary | ICD-10-CM

## 2020-05-26 DIAGNOSIS — F1010 Alcohol abuse, uncomplicated: Secondary | ICD-10-CM | POA: Diagnosis present

## 2020-05-26 DIAGNOSIS — F17200 Nicotine dependence, unspecified, uncomplicated: Secondary | ICD-10-CM | POA: Diagnosis present

## 2020-05-26 DIAGNOSIS — M109 Gout, unspecified: Secondary | ICD-10-CM | POA: Diagnosis present

## 2020-05-26 DIAGNOSIS — Z8711 Personal history of peptic ulcer disease: Secondary | ICD-10-CM

## 2020-05-26 DIAGNOSIS — D61818 Other pancytopenia: Secondary | ICD-10-CM

## 2020-05-26 DIAGNOSIS — K259 Gastric ulcer, unspecified as acute or chronic, without hemorrhage or perforation: Secondary | ICD-10-CM | POA: Diagnosis present

## 2020-05-26 DIAGNOSIS — E876 Hypokalemia: Secondary | ICD-10-CM | POA: Diagnosis present

## 2020-05-26 DIAGNOSIS — Z87891 Personal history of nicotine dependence: Secondary | ICD-10-CM | POA: Diagnosis present

## 2020-05-26 DIAGNOSIS — N183 Chronic kidney disease, stage 3 unspecified: Secondary | ICD-10-CM | POA: Diagnosis present

## 2020-05-26 DIAGNOSIS — N179 Acute kidney failure, unspecified: Secondary | ICD-10-CM | POA: Diagnosis not present

## 2020-05-26 DIAGNOSIS — Z96611 Presence of right artificial shoulder joint: Secondary | ICD-10-CM | POA: Diagnosis present

## 2020-05-26 DIAGNOSIS — Z6820 Body mass index (BMI) 20.0-20.9, adult: Secondary | ICD-10-CM

## 2020-05-26 DIAGNOSIS — Z20822 Contact with and (suspected) exposure to covid-19: Secondary | ICD-10-CM | POA: Diagnosis present

## 2020-05-26 DIAGNOSIS — E43 Unspecified severe protein-calorie malnutrition: Secondary | ICD-10-CM | POA: Insufficient documentation

## 2020-05-26 DIAGNOSIS — Z8673 Personal history of transient ischemic attack (TIA), and cerebral infarction without residual deficits: Secondary | ICD-10-CM | POA: Diagnosis not present

## 2020-05-26 DIAGNOSIS — L209 Atopic dermatitis, unspecified: Secondary | ICD-10-CM | POA: Diagnosis present

## 2020-05-26 DIAGNOSIS — Z720 Tobacco use: Secondary | ICD-10-CM

## 2020-05-26 DIAGNOSIS — Z96641 Presence of right artificial hip joint: Secondary | ICD-10-CM | POA: Diagnosis present

## 2020-05-26 DIAGNOSIS — I10 Essential (primary) hypertension: Secondary | ICD-10-CM | POA: Diagnosis not present

## 2020-05-26 DIAGNOSIS — I472 Ventricular tachycardia: Secondary | ICD-10-CM | POA: Diagnosis not present

## 2020-05-26 DIAGNOSIS — F1090 Alcohol use, unspecified, uncomplicated: Secondary | ICD-10-CM

## 2020-05-26 DIAGNOSIS — Z7982 Long term (current) use of aspirin: Secondary | ICD-10-CM

## 2020-05-26 DIAGNOSIS — R079 Chest pain, unspecified: Secondary | ICD-10-CM | POA: Diagnosis not present

## 2020-05-26 DIAGNOSIS — E785 Hyperlipidemia, unspecified: Secondary | ICD-10-CM | POA: Diagnosis present

## 2020-05-26 DIAGNOSIS — I952 Hypotension due to drugs: Secondary | ICD-10-CM

## 2020-05-26 DIAGNOSIS — M549 Dorsalgia, unspecified: Secondary | ICD-10-CM | POA: Diagnosis present

## 2020-05-26 DIAGNOSIS — R0789 Other chest pain: Secondary | ICD-10-CM | POA: Diagnosis not present

## 2020-05-26 DIAGNOSIS — I2510 Atherosclerotic heart disease of native coronary artery without angina pectoris: Secondary | ICD-10-CM | POA: Diagnosis present

## 2020-05-26 LAB — CBC WITH DIFFERENTIAL/PLATELET
Abs Immature Granulocytes: 0.02 10*3/uL (ref 0.00–0.07)
Basophils Absolute: 0 10*3/uL (ref 0.0–0.1)
Basophils Relative: 1 %
Eosinophils Absolute: 0.6 10*3/uL — ABNORMAL HIGH (ref 0.0–0.5)
Eosinophils Relative: 15 %
HCT: 28.9 % — ABNORMAL LOW (ref 39.0–52.0)
Hemoglobin: 9.8 g/dL — ABNORMAL LOW (ref 13.0–17.0)
Immature Granulocytes: 1 %
Lymphocytes Relative: 20 %
Lymphs Abs: 0.8 10*3/uL (ref 0.7–4.0)
MCH: 33.8 pg (ref 26.0–34.0)
MCHC: 33.9 g/dL (ref 30.0–36.0)
MCV: 99.7 fL (ref 80.0–100.0)
Monocytes Absolute: 0.4 10*3/uL (ref 0.1–1.0)
Monocytes Relative: 9 %
Neutro Abs: 2.3 10*3/uL (ref 1.7–7.7)
Neutrophils Relative %: 54 %
Platelets: 162 10*3/uL (ref 150–400)
RBC: 2.9 MIL/uL — ABNORMAL LOW (ref 4.22–5.81)
RDW: 18.1 % — ABNORMAL HIGH (ref 11.5–15.5)
WBC: 4.1 10*3/uL (ref 4.0–10.5)
nRBC: 1 % — ABNORMAL HIGH (ref 0.0–0.2)

## 2020-05-26 LAB — COMPREHENSIVE METABOLIC PANEL
ALT: 40 U/L (ref 0–44)
ALT: 39 U/L (ref 0–44)
AST: 82 U/L — ABNORMAL HIGH (ref 15–41)
AST: 82 U/L — ABNORMAL HIGH (ref 15–41)
Albumin: 3.2 g/dL — ABNORMAL LOW (ref 3.5–5.0)
Albumin: 3 g/dL — ABNORMAL LOW (ref 3.5–5.0)
Alkaline Phosphatase: 63 U/L (ref 38–126)
Alkaline Phosphatase: 61 U/L (ref 38–126)
Anion gap: 15 (ref 5–15)
Anion gap: 11 (ref 5–15)
BUN: 19 mg/dL (ref 8–23)
BUN: 18 mg/dL (ref 8–23)
CO2: 24 mmol/L (ref 22–32)
CO2: 25 mmol/L (ref 22–32)
Calcium: 8.8 mg/dL — ABNORMAL LOW (ref 8.9–10.3)
Calcium: 8.3 mg/dL — ABNORMAL LOW (ref 8.9–10.3)
Chloride: 101 mmol/L (ref 98–111)
Chloride: 105 mmol/L (ref 98–111)
Creatinine, Ser: 2.37 mg/dL — ABNORMAL HIGH (ref 0.61–1.24)
Creatinine, Ser: 2.01 mg/dL — ABNORMAL HIGH (ref 0.61–1.24)
GFR, Estimated: 30 mL/min — ABNORMAL LOW (ref >60)
GFR, Estimated: 36 mL/min — ABNORMAL LOW (ref >60)
Glucose, Bld: 158 mg/dL — ABNORMAL HIGH (ref 70–99)
Glucose, Bld: 122 mg/dL — ABNORMAL HIGH (ref 70–99)
Potassium: 3.8 mmol/L (ref 3.5–5.1)
Potassium: 3.7 mmol/L (ref 3.5–5.1)
Sodium: 140 mmol/L (ref 135–145)
Sodium: 141 mmol/L (ref 135–145)
Total Bilirubin: 1.4 mg/dL — ABNORMAL HIGH (ref 0.3–1.2)
Total Bilirubin: 1.3 mg/dL — ABNORMAL HIGH (ref 0.3–1.2)
Total Protein: 6.4 g/dL — ABNORMAL LOW (ref 6.5–8.1)
Total Protein: 6 g/dL — ABNORMAL LOW (ref 6.5–8.1)

## 2020-05-26 LAB — TROPONIN I (HIGH SENSITIVITY)
Troponin I (High Sensitivity): 11 ng/L (ref <18)
Troponin I (High Sensitivity): 12 ng/L (ref <18)

## 2020-05-26 LAB — CBC
HCT: 27.3 % — ABNORMAL LOW (ref 39.0–52.0)
Hemoglobin: 9 g/dL — ABNORMAL LOW (ref 13.0–17.0)
MCH: 33.6 pg (ref 26.0–34.0)
MCHC: 33 g/dL (ref 30.0–36.0)
MCV: 101.9 fL — ABNORMAL HIGH (ref 80.0–100.0)
Platelets: 142 10*3/uL — ABNORMAL LOW (ref 150–400)
RBC: 2.68 MIL/uL — ABNORMAL LOW (ref 4.22–5.81)
RDW: 18.2 % — ABNORMAL HIGH (ref 11.5–15.5)
WBC: 3.1 10*3/uL — ABNORMAL LOW (ref 4.0–10.5)
nRBC: 0 % (ref 0.0–0.2)

## 2020-05-26 LAB — PHOSPHORUS: Phosphorus: 3.2 mg/dL (ref 2.5–4.6)

## 2020-05-26 LAB — MAGNESIUM: Magnesium: 1.2 mg/dL — ABNORMAL LOW (ref 1.7–2.4)

## 2020-05-26 MED ORDER — ATORVASTATIN CALCIUM 20 MG PO TABS
20.0000 mg | ORAL_TABLET | Freq: Every day | ORAL | Status: DC
  Administered 2020-05-27 – 2020-05-28 (×2): 20 mg via ORAL
  Filled 2020-05-26 (×2): qty 1

## 2020-05-26 MED ORDER — ALBUTEROL SULFATE HFA 108 (90 BASE) MCG/ACT IN AERS
2.0000 | INHALATION_SPRAY | Freq: Four times a day (QID) | RESPIRATORY_TRACT | Status: DC | PRN
  Filled 2020-05-26: qty 6.7

## 2020-05-26 MED ORDER — FENOFIBRATE 54 MG PO TABS
54.0000 mg | ORAL_TABLET | Freq: Every day | ORAL | Status: DC
  Administered 2020-05-27 – 2020-05-28 (×2): 54 mg via ORAL
  Filled 2020-05-26 (×2): qty 1

## 2020-05-26 MED ORDER — THIAMINE HCL 100 MG/ML IJ SOLN
100.0000 mg | Freq: Every day | INTRAMUSCULAR | Status: DC
  Filled 2020-05-26: qty 2

## 2020-05-26 MED ORDER — ONDANSETRON HCL 4 MG/2ML IJ SOLN: 4.0000 mg | Freq: Four times a day (QID) | INTRAMUSCULAR | Status: DC | PRN

## 2020-05-26 MED ORDER — ALLOPURINOL 100 MG PO TABS
100.0000 mg | ORAL_TABLET | Freq: Two times a day (BID) | ORAL | Status: DC
  Administered 2020-05-26 – 2020-05-27 (×3): 100 mg via ORAL
  Filled 2020-05-26 (×6): qty 1

## 2020-05-26 MED ORDER — LORATADINE 10 MG PO TABS
10.0000 mg | ORAL_TABLET | Freq: Every day | ORAL | Status: DC
  Administered 2020-05-27: 10 mg via ORAL
  Filled 2020-05-26: qty 1

## 2020-05-26 MED ORDER — LORAZEPAM 2 MG/ML IJ SOLN: 1.0000 mg | INTRAMUSCULAR | Status: DC | PRN

## 2020-05-26 MED ORDER — PANTOPRAZOLE SODIUM 40 MG PO TBEC
40.0000 mg | DELAYED_RELEASE_TABLET | Freq: Every day | ORAL | Status: DC
  Administered 2020-05-27 – 2020-05-28 (×2): 40 mg via ORAL
  Filled 2020-05-26 (×2): qty 1

## 2020-05-26 MED ORDER — SODIUM CHLORIDE 0.9 % IV SOLN: Freq: Once | INTRAVENOUS | Status: AC

## 2020-05-26 MED ORDER — FERROUS SULFATE 325 (65 FE) MG PO TABS
325.0000 mg | ORAL_TABLET | Freq: Every day | ORAL | Status: DC
  Administered 2020-05-27 – 2020-05-28 (×2): 325 mg via ORAL
  Filled 2020-05-26 (×3): qty 1

## 2020-05-26 MED ORDER — OMEGA-3-ACID ETHYL ESTERS 1 G PO CAPS
1.0000 g | ORAL_CAPSULE | Freq: Every day | ORAL | Status: DC
  Administered 2020-05-27 – 2020-05-28 (×2): 1 g via ORAL
  Filled 2020-05-26 (×2): qty 1

## 2020-05-26 MED ORDER — FENTANYL CITRATE (PF) 100 MCG/2ML IJ SOLN
50.0000 ug | INTRAMUSCULAR | Status: DC | PRN
  Administered 2020-05-26 – 2020-05-28 (×4): 50 ug via INTRAVENOUS
  Filled 2020-05-26 (×4): qty 2

## 2020-05-26 MED ORDER — ENOXAPARIN SODIUM 40 MG/0.4ML ~~LOC~~ SOLN
40.0000 mg | SUBCUTANEOUS | Status: DC
  Administered 2020-05-26 – 2020-05-27 (×2): 40 mg via SUBCUTANEOUS
  Filled 2020-05-26 (×2): qty 0.4

## 2020-05-26 MED ORDER — ASPIRIN EC 81 MG PO TBEC
162.0000 mg | DELAYED_RELEASE_TABLET | Freq: Every day | ORAL | Status: DC
  Administered 2020-05-27 – 2020-05-28 (×2): 162 mg via ORAL
  Filled 2020-05-26 (×2): qty 2

## 2020-05-26 MED ORDER — FOLIC ACID 1 MG PO TABS
1.0000 mg | ORAL_TABLET | Freq: Every day | ORAL | Status: DC
  Administered 2020-05-27 – 2020-05-28 (×2): 1 mg via ORAL
  Filled 2020-05-26 (×2): qty 1

## 2020-05-26 MED ORDER — ONDANSETRON HCL 4 MG PO TABS: 4.0000 mg | ORAL_TABLET | Freq: Four times a day (QID) | ORAL | Status: DC | PRN

## 2020-05-26 MED ORDER — ADULT MULTIVITAMIN W/MINERALS CH
1.0000 | ORAL_TABLET | Freq: Every day | ORAL | Status: DC
  Administered 2020-05-27: 1 via ORAL
  Filled 2020-05-26: qty 1

## 2020-05-26 MED ORDER — LORAZEPAM 1 MG PO TABS: 1.0000 mg | ORAL_TABLET | ORAL | Status: DC | PRN

## 2020-05-26 MED ORDER — THIAMINE HCL 100 MG PO TABS
100.0000 mg | ORAL_TABLET | Freq: Every day | ORAL | Status: DC
  Administered 2020-05-27 – 2020-05-28 (×2): 100 mg via ORAL
  Filled 2020-05-26 (×3): qty 1

## 2020-05-26 MED ORDER — ACETAMINOPHEN 650 MG RE SUPP: 650.0000 mg | Freq: Four times a day (QID) | RECTAL | Status: DC | PRN

## 2020-05-26 MED ORDER — ACETAMINOPHEN 325 MG PO TABS
650.0000 mg | ORAL_TABLET | Freq: Four times a day (QID) | ORAL | Status: DC | PRN
  Administered 2020-05-27 (×2): 650 mg via ORAL
  Filled 2020-05-26 (×2): qty 2

## 2020-05-26 MED ORDER — SODIUM CHLORIDE 0.9 % IV BOLUS
1000.0000 mL | Freq: Once | INTRAVENOUS | Status: AC
  Administered 2020-05-26: 1000 mL via INTRAVENOUS

## 2020-05-26 MED ORDER — FLUTICASONE PROPIONATE 50 MCG/ACT NA SUSP
1.0000 | Freq: Two times a day (BID) | NASAL | Status: DC
  Administered 2020-05-26 – 2020-05-27 (×3): 1 via NASAL
  Filled 2020-05-26 (×2): qty 16

## 2020-05-26 MED ORDER — ADULT MULTIVITAMIN W/MINERALS CH: 1.0000 | ORAL_TABLET | Freq: Every day | ORAL | Status: DC

## 2020-05-26 NOTE — Progress Notes (Signed)
Cardiology Office Note   Date:  05/26/2020   ID:  Travis [name], DOB 1955/05/26, MRN [id number]  PCP:  [name], NP  Cardiologist:   Kathlyn Sacramento, MD   Chief Complaint  Patient presents with  . Follow-up    3 months      History of Present Illness: Travis [name] is a 65 y.o. 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 He has known history of mild nonobstructive one-vessel coronary artery disease, history of TIA, tobacco use, previous excessive alcohol use and hyperlipidemia. Echocardiogram in November 2016 showed worsening LV systolic dysfunction with an ejection fraction of 25-30%.  Cardiac catheterization showed mild nonobstructive one-vessel coronary artery disease. LVEDP was mildly elevated.   He was hospitalized in August, 2021 with transient vision loss and dizziness.  Stroke work-up was negative.  He was noted to be anemic with a hemoglobin of 9.  Negative occult blood.  He was also noted to have acute renal failure with a creatinine of 3.29.  Renal function improved with hydration.  However, he was still discharged home with a creatinine of 2.4 and was sent home on the same dose of Entresto and spironolactone. He was rehospitalized the end of August with dizziness and blurred vision and was found to be febrile and hypotensive.  His hemoglobin was down to 6.6 and he was transfused.  Anemia work-up revealed large grade a reflux esophagitis.  The patient was taken off Entresto due to hypotension.  His fever was of unknown origin.  His anemia subsequently improved and most recent CBC showed a hemoglobin of 11.1.  In addition, renal function was back to baseline . Echocardiogram during hospitalization showed an EF of 45 to 50%.  He comes today complaining of dizziness, discomfort in his back between shoulder blades as well as weight loss.  He lost 15 pounds since December and he  reports having no appetite.  He denies chest pain but he seems to be very uncomfortable.  He continues to smoke.  He started drinking again.  Past Medical History:  Diagnosis Date  . Alcohol abuse   . 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 05/2019 Echo: EF 35-40%; b. 10/2019 Ehco: EF 45-50%.  . Allergy   . Asthma   . 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 Chronic anemia   . Clotting disorder ([place])   . Diabetes mellitus, type 2 ([place])    pt reports his DM is gone  . GI bleed    15 years ago  . Gout   . Hemoptysis    secondary to pulmonary edema  . HFrEF (heart failure with reduced ejection fraction) ([place])    a. 12/2010 Echo: EF 20-25%; b 08/2013 Echo: EF 45-50%; c. 01/2015 Echo: EF 25-30%; d. 07/2016 Echo: EF 35-40%; e. 05/2019 Echo: EF 35-40%; d. 10/2019 Echo: EF 45-50%, mild LVH, mild red RV fxn, mildl dil RA, Triv MR. Mild to mod Ao Sclerosis w/o stenosis.  . Hyperlipidemia   . Hypertension   . Nonischemic cardiomyopathy ([place])   . Nonobstructive Coronary artery disease    a. 01/2015 Cath: LM nl, LAD 30p/m, LCX nl, RCA 10p/m, RPDA min irregs.  . Osteoarthritis   . Pneumonia   . TIA (transient ischemic attack)   . Tobacco abuse   . Vision loss    peripherial vision only left eye.Central Retinal  artery occusion  Past Surgical History:  Procedure Laterality Date  . BIOPSY  11/25/2019   Procedure: BIOPSY;  Surgeon: Ladene Artist, MD;  Location: [facility];  Service: Endoscopy;;  . CARDIAC CATHETERIZATION    . CARDIAC CATHETERIZATION N/A 01/28/2015   Procedure: Left Heart Cath and Coronary Angiography;  Surgeon: Wellington Hampshire, MD;  Location: [facility];  Service: Cardiovascular;  Laterality: N/A;  . COLONOSCOPY    . ESOPHAGOGASTRODUODENOSCOPY (EGD) WITH PROPOFOL N/A 11/25/2019   Procedure: ESOPHAGOGASTRODUODENOSCOPY (EGD) WITH PROPOFOL;  Surgeon: Ladene Artist, MD;  Location[facility] ENDOSCOPY;  Service: Endoscopy;  Laterality: N/A;  . HIP  ARTHROPLASTY Right 03/15/2013   Procedure: ARTHROPLASTY BIPOLAR HIP;  Surgeon: [name], MD;  Location: [facility];  Service: Orthopedics;  Laterality: Right;  . TOTAL SHOULDER ARTHROPLASTY Right 09/01/2015   Procedure: RIGHT TOTAL SHOULDER ARTHROPLASTY;  Surgeon: [name], MD;  Location: [facility];  Service: Orthopedics;  Laterality: Right;     Current Outpatient Medications  Medication Sig Dispense Refill  . albuterol (PROVENTIL HFA) 108 (90 Base) MCG/ACT inhaler Inhale 2 puffs into the lungs every 6 (six) hours as needed for wheezing or shortness of breath.    . allopurinol (ZYLOPRIM) 100 MG tablet Take 100 mg by mouth 2 (two) times daily.    [name] aspirin EC 325 MG tablet Take 162.5 mg by mouth daily.    . carvedilol (COREG) 12.5 MG tablet Take 6.25 mg by mouth 2 (two) times daily with a meal.    . cetirizine (ZYRTEC) 10 MG tablet TAKE 1 TABLET BY MOUTH DAILY 30 tablet 11  . fenofibrate (TRICOR) 145 MG tablet TAKE 1 TABLET BY MOUTH ONCE A DAY 90 tablet 0  . ferrous sulfate 325 (65 FE) MG tablet Take 325 mg by mouth daily with breakfast.    . fluticasone (FLONASE) 50 MCG/ACT nasal spray Place 1 spray into both nostrils 2 (two) times daily. 16 g 0  . Multiple Vitamin (MULTIVITAMIN WITH MINERALS) TABS tablet Take 1 tablet by mouth daily. Men's One a Day    . Omega-3 Fatty Acids (FISH OIL PO) Take 1 capsule by mouth daily.    . sacubitril-valsartan (ENTRESTO) 24-26 MG Take 1 tablet by mouth 2 (two) times daily. 60 tablet 3  . spironolactone (ALDACTONE) 25 MG tablet Take 12.5 mg by mouth daily.    [name] triamcinolone (KENALOG) 0.1 % APPLY TO AFFECTED AREAS TWICE A DAY AS NEEDED. AVOID FACE,GROIN, OR UNDERARMS 454 g 0  . atorvastatin (LIPITOR) 20 MG tablet Take 1 tablet (20 mg total) by mouth daily. 90 tablet 3  . pantoprazole (PROTONIX) 40 MG tablet Take 1 tablet (40 mg total) by mouth daily. 90 tablet 0   No current facility-administered medications for this visit.    Allergies:   Patient  has no known allergies.    Social History:  The patient  reports that he has been smoking cigarettes. He has a 15.00 pack-year smoking history. He has never used smokeless tobacco. He reports current alcohol use of about 1.0 standard drink of alcohol per week. He reports current drug use. Drug: Marijuana.   Family History:  The patient's family history includes Dementia in his brother; Diabetes in his father, mother, and sister; Hypertension in his father and mother.    ROS:  Please see the history of present illness.   Otherwise, review of systems are positive for none.   All other systems are reviewed and negative.    PHYSICAL EXAM: VS:  BP [name])  84/62   Pulse 100   Ht '6\' 4"'$  (1.93 m)   Wt 169 lb (76.7 kg)   BMI 20.57 kg/m  , BMI Body mass index is 20.57 kg/m. GEN: Well nourished, well developed, he seems in mild distress due to discomfort. HEENT: normal  Neck: no JVD, carotid bruits, or masses Cardiac: RRR; no murmurs, rubs, or gallops,no edema  Respiratory:  clear to auscultation bilaterally, normal work of breathing GI: soft, nontender, nondistended, + BS MS: no deformity or atrophy  Skin: warm and dry, no rash Neuro:  Strength and sensation are intact Psych: euthymic mood, full affect   EKG:  EKG is not  ordered today.     Recent Labs: 11/20/2019: B Natriuretic Peptide 365.0 12/07/2019: ALT 11; TSH 0.72 01/13/2020: BUN 18; Creatinine, Ser 1.58; Hemoglobin 11.3; Platelets 250; Potassium 4.7; Sodium 142    Lipid Panel    Component Value Date/Time   CHOL 150 11/09/2019 0911   CHOL 138 06/18/2017 1425   TRIG 77 11/09/2019 0911   HDL 47 11/09/2019 0911   HDL 67 06/18/2017 1425   CHOLHDL 3.2 11/09/2019 0911   VLDL 15 11/09/2019 0911   LDLCALC 88 11/09/2019 0911   LDLCALC 51 06/18/2017 1425   LDLDIRECT 69.0 12/30/2018 1232      Wt Readings from Last 3 Encounters:  05/26/20 169 lb (76.7 kg)  04/14/20 174 lb (78.9 kg)  02/24/20 184 lb (83.5 kg)        ASSESSMENT AND PLAN: MN  1.   Chronic systolic heart failure: The patient is hypotensive today and he complains of dizziness.  He lost 15 pounds since December and he looks volume depleted.  I rechecked his blood pressure manually and it was 74/50. Due to this and his symptoms, I am transferring him to the ED for evaluation and IV hydration.  2. Essential hypertension: Is low today and will have to hold his antihypertensive medications until he gets further work-up in the ED.  3. Tobacco use: He continues to smoke and has not been able to quit smoking.   4.  Hyperlipidemia: Currently on atorvastatin 20 mg daily.  Most recent lipid profile showed an LDL of 88.  5.  Alcohol use: Unfortunately, he started drinking again recently.  6.  Weight loss: With poor appetite.  This is concerning and the patient will need to be evaluated for underlying malignancy.  7.  Back pain between shoulder blades, he will need labs checked including troponin and chest x-ray.    Disposition:   The patient will be transferred to the ED for evaluation given symptomatic hypertension  Signed,  Kathlyn Sacramento, MD  05/26/2020 10:25 AM    [place]

## 2020-05-26 NOTE — H&P (Signed)
History and Physical    Travis [name] [id number] DOB: Feb 24, 1956 DOA: 05/26/2020  PCP: [name], NP   Patient coming from: Home  I have personally briefly reviewed patient's old medical records in [place]  Chief Complaint: Dizziness  HPI: Travis [name] is a 65 y.o. male with medical history significant for nonischemic cardiomyopathy secondary to alcohol abuse with last known LVEF of 45 to 50%, history of TIA, hypertension, anemia of chronic disease and nonobstructive coronary artery disease who was sent to the emergency room from his cardiologist office for evaluation.  Patient had gone to see his cardiologist for routine follow-up appointment and complained of feeling dizzy and lightheaded. He also complained of discomfort in his mid back back between his shoulder blades as well as a 15 pound weight loss since December, 2021.  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  He states that his appetite is poor.  He denied having any chest pain. Patient was noted to have a blood pressure of 74/50 in his cardiologist office and so he was sent to the ER for further evaluation. He denied having any nausea, no vomiting, no palpitations, no diaphoresis, no headache, no blurred vision, no cough, no fever, no chills, no diarrhea, no constipation, no abdominal pain, no urinary frequency, no nocturia, no dysuria, no falls. Labs show sodium 140, potassium 3.8, chloride 101, bicarb 24, glucose 158, BUN 19, creatinine 2.37 compared to baseline of 1.58, alkaline phosphatase 63, albumin 3.2, AST 82, ALT 40, total protein 6.4, WBC 9.8, hematocrit 28.9, MCV 99.7, RDW 18.1, platelet count 162 Chest x-ray reviewed by me showed no active cardiopulmonary disease Twelve-lead EKG reviewed by me shows sinus tachycardia with new T wave inversions in the inferior lateral leads    ED Course: Patient is a 65 year old male who was sent to the emergency room by  his cardiologist for evaluation of symptomatic hypotension as well as pain in between both shoulder blades.  Patient had a blood pressure of 74/50 while in the office.  He received IV fluid hydration upon arrival to the ER with improvement in his blood pressure to 98 systolic.  Patient noted to have worsening of his renal function from baseline 1.58 >> 2.37 and has new EKG changes, he has T wave inversions in the inferolateral leads.  He will be referred to observation status for further evaluation.    Review of Systems: As per HPI otherwise all other systems reviewed and negative.    Past Medical History:  Diagnosis Date  . Alcohol abuse   . 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 05/2019 Echo: EF 35-40%; b. 10/2019 Ehco: EF 45-50%.  . Allergy   . Asthma   . 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 Chronic anemia   . Clotting disorder ([place])   . Diabetes mellitus, type 2 ([place])    pt reports his DM is gone  . GI bleed    15 years ago  . Gout   . Hemoptysis    secondary to pulmonary edema  . HFrEF (heart failure with reduced ejection fraction) ([place])    a. 12/2010 Echo: EF 20-25%; b 08/2013 Echo: EF 45-50%; c. 01/2015 Echo: EF 25-30%; d. 07/2016 Echo: EF 35-40%; e. 05/2019 Echo: EF 35-40%; d. 10/2019 Echo: EF 45-50%, mild LVH, mild red RV fxn, mildl dil RA, Triv MR. Mild to mod Ao Sclerosis w/o stenosis.  . Hyperlipidemia   .  Hypertension   . Nonischemic cardiomyopathy ([place])   . Nonobstructive Coronary artery disease    a. 01/2015 Cath: LM nl, LAD 30p/m, LCX nl, RCA 10p/m, RPDA min irregs.  . Osteoarthritis   . Pneumonia   . TIA (transient ischemic attack)   . Tobacco abuse   . Vision loss    peripherial vision only left eye.Central Retinal  artery occusion     Past Surgical History:  Procedure Laterality Date  . BIOPSY  11/25/2019   Procedure: BIOPSY;  Surgeon: Ladene Artist, MD;  Location: [facility];  Service: Endoscopy;;  . CARDIAC CATHETERIZATION    .  CARDIAC CATHETERIZATION N/A 01/28/2015   Procedure: Left Heart Cath and Coronary Angiography;  Surgeon: Wellington Hampshire, MD;  Location: [facility];  Service: Cardiovascular;  Laterality: N/A;  . COLONOSCOPY    . ESOPHAGOGASTRODUODENOSCOPY (EGD) WITH PROPOFOL N/A 11/25/2019   Procedure: ESOPHAGOGASTRODUODENOSCOPY (EGD) WITH PROPOFOL;  Surgeon: Ladene Artist, MD;  Location: [facility];  Service: Endoscopy;  Laterality: N/A;  . HIP ARTHROPLASTY Right 03/15/2013   Procedure: ARTHROPLASTY BIPOLAR HIP;  Surgeon: [name], MD;  Location: [facility];  Service: Orthopedics;  Laterality: Right;  . TOTAL SHOULDER ARTHROPLASTY Right 09/01/2015   Procedure: RIGHT TOTAL SHOULDER ARTHROPLASTY;  Surgeon: [name], MD;  Location: [facility];  Service: Orthopedics;  Laterality: Right;     reports that he has been smoking cigarettes. He has a 15.00 pack-year smoking history. He has never used smokeless tobacco. He reports current alcohol use of about 1.0 standard drink of alcohol per week. He reports current drug use. Drug: Marijuana.  No Known Allergies  Family History  Problem Relation Age of Onset  . Diabetes Mother   . Hypertension Mother   . Diabetes Father   . Hypertension Father   . Diabetes Sister   . Dementia Brother   . Cancer Neg Hx   . Heart disease Neg Hx   . Stroke Neg Hx   . Colon cancer Neg Hx   . Esophageal cancer Neg Hx   . Rectal cancer Neg Hx   . Stomach cancer Neg Hx       Prior to Admission medications   Medication Sig Start Date End Date Taking? Authorizing Provider  albuterol (PROVENTIL HFA) 108 (90 Base) MCG/ACT inhaler Inhale 2 puffs into the lungs every 6 (six) hours as needed for wheezing or shortness of breath.    [provider]  allopurinol (ZYLOPRIM) 100 MG tablet Take 100 mg by mouth 2 (two) times daily.    [provider]  aspirin EC 325 MG tablet Take 162.5 mg by mouth daily.    [provider]  atorvastatin (LIPITOR)  20 MG tablet Take 1 tablet (20 mg total) by mouth daily. 02/09/20 05/09/20  Alisa Graff, FNP  carvedilol (COREG) 12.5 MG tablet Take 6.25 mg by mouth 2 (two) times daily with a meal.    [provider]  cetirizine (ZYRTEC) 10 MG tablet TAKE 1 TABLET BY MOUTH DAILY 07/22/19   [name], NP  fenofibrate (TRICOR) 145 MG tablet TAKE 1 TABLET BY MOUTH ONCE A DAY 05/13/20   [name], NP  ferrous sulfate 325 (65 FE) MG tablet Take 325 mg by mouth daily with breakfast.    [provider]  fluticasone (FLONASE) 50 MCG/ACT nasal spray Place 1 spray into both nostrils 2 (two) times daily. 03/29/16   Cuthriell, Charline Bills, PA-C  Multiple Vitamin (MULTIVITAMIN WITH MINERALS) TABS  tablet Take 1 tablet by mouth daily. Men's One a Day    [provider]  Omega-3 Fatty Acids (FISH OIL PO) Take 1 capsule by mouth daily.    [provider]  pantoprazole (PROTONIX) 40 MG tablet Take 1 tablet (40 mg total) by mouth daily. 12/11/19 03/10/20  Kayleen Memos, DO  sacubitril-valsartan (ENTRESTO) 24-26 MG Take 1 tablet by mouth 2 (two) times daily. 04/15/20   Alisa Graff, FNP  spironolactone (ALDACTONE) 25 MG tablet Take 12.5 mg by mouth daily.    [provider]  triamcinolone (KENALOG) 0.1 % APPLY TO AFFECTED AREAS TWICE A DAY AS NEEDED. [place], OR UNDERARMS 05/13/20   [name], NP    Physical Exam: Vitals:   05/26/20 1120 05/26/20 1130 05/26/20 1200 05/26/20 1230  BP: '96/61 98/69 95/69 '$ 96/66  Pulse:   87 97  Resp: 12 15 (!) 6 18  Temp: 98.7 F (37.1 C)     TempSrc: Oral     SpO2:   99% 100%     Vitals:   05/26/20 1120 05/26/20 1130 05/26/20 1200 05/26/20 1230  BP: '96/61 98/69 95/69 '$ 96/66  Pulse:   87 97  Resp: 12 15 (!) 6 18  Temp: 98.7 F (37.1 C)     TempSrc: Oral     SpO2:   99% 100%      Constitutional: Alert and oriented x 3 .  Appears comfortable and not in any apparent distress HEENT:      Head: Normocephalic and  atraumatic.         Eyes: PERLA, EOMI, Conjunctivae pallor. Sclera is non-icteric.       Mouth/Throat: Mucous membranes are moist.       Neck: Supple with no signs of meningismus. Cardiovascular: Regular rate and rhythm. No murmurs, gallops, or rubs. 2+ symmetrical distal pulses are present . No JVD. No LE edema Respiratory: Respiratory effort normal .Lungs sounds clear bilaterally. No wheezes, crackles, or rhonchi.  Gastrointestinal: Soft, non tender, and non distended with positive bowel sounds.  Genitourinary: No CVA tenderness. Musculoskeletal: Nontender with normal range of motion in all extremities. No cyanosis, or erythema of extremities. Neurologic:  Face is symmetric. Moving all extremities. No gross focal neurologic deficits  Skin: Skin is warm, dry.  No rash or ulcers Psychiatric: Mood and affect are normal   Labs on Admission: I have personally reviewed following labs and imaging studies  CBC: Recent Labs  Lab 05/26/20 1119  WBC 4.1  NEUTROABS 2.3  HGB 9.8*  HCT 28.9*  MCV 99.7  PLT 0000000   Basic Metabolic Panel: Recent Labs  Lab 05/26/20 1119  NA 140  K 3.8  CL 101  CO2 24  GLUCOSE 158*  BUN 19  CREATININE 2.37*  CALCIUM 8.8*   GFR: Estimated Creatinine Clearance: 34.2 mL/min (A) (by C-G formula based on SCr of 2.37 mg/dL (H)). Liver Function Tests: Recent Labs  Lab 05/26/20 1119  AST 82*  ALT 40  ALKPHOS 63  BILITOT 1.4*  PROT 6.4*  ALBUMIN 3.2*   No results for input(s): LIPASE, AMYLASE in the last 168 hours. No results for input(s): AMMONIA in the last 168 hours. Coagulation Profile: No results for input(s): INR, PROTIME in the last 168 hours. Cardiac Enzymes: No results for input(s): CKTOTAL, CKMB, CKMBINDEX, TROPONINI in the last 168 hours. BNP (last 3 results) No results for input(s): PROBNP in the last 8760 hours. HbA1C: No results for input(s): HGBA1C in the last 72 hours.  CBG: No results for input(s): GLUCAP in the last 168  hours. Lipid Profile: No results for input(s): CHOL, HDL, LDLCALC, TRIG, CHOLHDL, LDLDIRECT in the last 72 hours. Thyroid Function Tests: No results for input(s): TSH, T4TOTAL, FREET4, T3FREE, THYROIDAB in the last 72 hours. Anemia Panel: No results for input(s): VITAMINB12, FOLATE, FERRITIN, TIBC, IRON, RETICCTPCT in the last 72 hours. Urine analysis:    Component Value Date/Time   COLORURINE YELLOW 11/20/2019 1600   APPEARANCEUR HAZY (A) 11/20/2019 1600   APPEARANCEUR Cloudy 11/19/2012 1107   LABSPEC 1.036 (H) 11/20/2019 1600   LABSPEC 1.024 11/19/2012 1107   PHURINE 5.0 11/20/2019 1600   GLUCOSEU NEGATIVE 11/20/2019 1600   GLUCOSEU Negative 11/19/2012 1107   HGBUR SMALL (A) 11/20/2019 1600   BILIRUBINUR NEGATIVE 11/20/2019 1600   BILIRUBINUR Negative 11/19/2012 1107   KETONESUR NEGATIVE 11/20/2019 1600   PROTEINUR NEGATIVE 11/20/2019 1600   UROBILINOGEN 0.2 09/14/2013 1225   NITRITE NEGATIVE 11/20/2019 1600   LEUKOCYTESUR TRACE (A) 11/20/2019 1600   LEUKOCYTESUR Negative 11/19/2012 1107    Radiological Exams on Admission: DG Chest Portable 1 View  Result Date: 05/26/2020 CLINICAL DATA:  Chest pain EXAM: PORTABLE CHEST 1 VIEW COMPARISON:  11/20/2019 FINDINGS: Cardiac shadow is within normal limits. The lungs are well aerated bilaterally. No focal infiltrate or effusion is seen. No acute bony abnormality is noted. Right shoulder replacement is seen. IMPRESSION: No active disease. Electronically Signed   By: Inez Catalina M.D.   On: 05/26/2020 11:50     Assessment/Plan Principal Problem:   Hypotension Active Problems:   Chronic systolic heart failure (HCC)   Tobacco use   Cardiomyopathy, nonischemic (HCC)   Essential hypertension   Alcohol use   CKD (chronic kidney disease), stage III (HCC)   AKI (acute kidney injury) (HCC)     Symptomatic hypotension Most likely medication induced Patient with complaints of dizziness and noted to have blood pressure of 74/50 We  will hold carvedilol, Entresto and spironolactone Continue IV fluid hydration    Chronic systolic heart failure Secondary to nonischemic cardiomyopathy most likely related to alcohol abuse Last known LVEF of 45 to 50% Hold Entresto, spironolactone and carvedilol for now    Alcohol abuse Patient has been advised to abstain from further alcohol use Patient noted to have elevated AST levels compared to ALT We will place patient on lorazepam and administer for CIWA score of 8 or greater Patient on thiamine, folic acid and MVI   Acute on chronic kidney injury Most likely secondary to overdiuresis Patient noted to have a bump in his creatinine from 1.58 >> 2.37  Hold spironolactone and Entresto for now Repeat renal parameters in a.m.    Nicotine dependence  Smoking cessation has been discussed with patient in detail Declines a nicotine transdermal patch at this time    Coronary artery disease Continue aspirin and statins    EKG changes Patient noted to have new EKG changes involving the inferolateral leads We will cycle cardiac enzymes Continue aspirin and statins    DVT prophylaxis: Lovenox Code Status: full code Family Communication: Greater than 50% of time was spent discussing plan of care with patient at the bedside.  All questions and concerns have been addressed.  He verbalizes understanding and agrees to the plan. Disposition Plan: Back to previous home environment Consults called: Cardiology Status: Observation    Kaulin Chaves MD [facility]     05/26/2020, 1:33 PM

## 2020-05-26 NOTE — Consult Note (Signed)
Cardiology Consult    Patient ID: Travis [name] MRN: [id number], DOB/AGE: [birth year]   Admit date: 05/26/2020 Date of Consult: 05/27/2020  Primary Physician: [name], NP Primary Cardiologist: Kathlyn Sacramento, MD Requesting Provider: Imagene Riches, MD  Patient Profile    Travis [name] is a 65 y.o. male with a history of nonischemic cardiomyopathy thought to be r/t alcohol abuse,HFrEF w/LVEF 45-50% on last echo Aug 2021, nonobstructive CAD s/p cath 2016, HTN, hyperlipidemia, chronic anemia, gastric ulcer disease, TIA, tobacco abuse, who is being seen today for the evaluation of hypotension, ekg changes at the request of Dr. Si Raider.  Past Medical History   Past Medical History:  Diagnosis Date  . Alcohol abuse   . 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 05/2019 Echo: EF 35-40%; b. 10/2019 Echo: EF 45-50%.  . Allergy   . Asthma   . 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 Chronic anemia   . Clotting disorder ([place])   . Diabetes mellitus, type 2 ([place])    pt reports his DM is gone  . GI bleed    15 years ago  . Gout   . Hemoptysis    secondary to pulmonary edema  . HFrEF (heart failure with reduced ejection fraction) ([place])    a. 12/2010 Echo: EF 20-25%; b 08/2013 Echo: EF 45-50%; c. 01/2015 Echo: EF 25-30%; d. 07/2016 Echo: EF 35-40%; e. 05/2019 Echo: EF 35-40%; d. 10/2019 Echo: EF 45-50%, mild LVH, mild red RV fxn, mildl dil RA, Triv MR. Mild to mod Ao Sclerosis w/o stenosis.  . Hyperlipidemia   . Hypertension   . Nonischemic cardiomyopathy ([place])   . Nonobstructive Coronary artery disease    a. 01/2015 Cath: LM nl, LAD 30p/m, LCX nl, RCA 10p/m, RPDA min irregs.  . Osteoarthritis   . Pneumonia   . TIA (transient ischemic attack)   . Tobacco abuse   . Vision loss    peripherial vision only left eye.Central Retinal  artery occusion     Past Surgical History:  Procedure Laterality Date  . BIOPSY  11/25/2019   Procedure: BIOPSY;  Surgeon: Ladene Artist, MD;   Location: [facility];  Service: Endoscopy;;  . CARDIAC CATHETERIZATION    . CARDIAC CATHETERIZATION N/A 01/28/2015   Procedure: Left Heart Cath and Coronary Angiography;  Surgeon: Wellington Hampshire, MD;  Location: [facility];  Service: Cardiovascular;  Laterality: N/A;  . COLONOSCOPY    . ESOPHAGOGASTRODUODENOSCOPY (EGD) WITH PROPOFOL N/A 11/25/2019   Procedure: ESOPHAGOGASTRODUODENOSCOPY (EGD) WITH PROPOFOL;  Surgeon: Ladene Artist, MD;  Location: [facility] ENDOSCOPY;  Service: Endoscopy;  Laterality: N/A;  . HIP ARTHROPLASTY Right 03/15/2013   Procedure: ARTHROPLASTY BIPOLAR HIP;  Surgeon: [name], MD;  Location: [facility];  Service: Orthopedics;  Laterality: Right;  . TOTAL SHOULDER ARTHROPLASTY Right 09/01/2015   Procedure: RIGHT TOTAL SHOULDER ARTHROPLASTY;  Surgeon: [name], MD;  Location: [facility];  Service: Orthopedics;  Laterality: Right;     Allergies  No Known Allergies  History of Present Illness    ANTHOY [name] is a 65 y.o. male with a history of nonischemic cardiomyopathy thought to be r/t alcohol abuse,HFrEF w/LVEF 45-50% on last echo Aug 2021, nonobstructive CAD s/p cath 2016, HTN, hyperlipidemia, GI bleed, gastric ulcer disease, chronic anemia, TIA, tobacco abuse, alcohol abuse, who is being seen today for the evaluation of hypotension, ekg changes at the request of Dr. Si Raider.  His cardiac history dates back to at  least 2013 when he was admitted with acute systolic HF. More recently, echo 01/2015 showed EF 25-30%, a decrease from previous echo in 2015 w/EF 45-50%. Most recent echo 10/2019 shows EF 45-50%. He had cardiac cath Nov 2016 which showed 10% stenosis in Prox RCA to Mid RCA and 30% stenosis in Prox LAD to Mid LAD which is not much different from previous angiography. Cardiomyopathy felt to be due to previous alcohol use. He has had multiple admissions which included titration of medications r/t hypotension, pre-syncope. Prior to yesterday's office visit  with [facility], he was most recently seen in HF clinic in Jan. 2022. He was found to be euvolemic and no medication changes were made. He was seen by Dr. Fletcher Anon in the office earlier today and found to be hypotensive at 74/50 mmHg with c/o dizziness. He transferred him to the ED for evaluation of symptomatic hypotension. He reports weight loss of 15 lbs since December, has poor appetite, and back pain between his shoulder blades. He had previously stopped drinking alcohol but has started back drinking.   In the ED, he received IV hydration with improvement in BP. His hsTroponin showed no elevation. His SCr was increased from baseline at 2.37 2.01 1.75. His Entresto and spironolactone are being held in setting of AKI. EKG was reviewed by Dr. Francine Graven, the admitting MD, and revealed new T wave inversions in the inferolateral leads. This is a change from previous EKG on 01/13/20. He was admitted for further evaluation.  Today, he reports that he slept well and is no longer dizzy. He is sitting up in bed eating breakfast. His "dizziness" is described as a funny feeling when he stands up too fast. He does not feel the room spinning and he has not passed out or fallen. He denies lightheadedness this morning. He c/o itching all over and is ready to go home. Back pain is present in the lumbar region. The pain that he was having yesterday in his upper back between his shoulder blades has resolved. Has been having anorexia for at least two months - states he will take a few bites of something and then lose his appetite. No n/v/d. He denies chest discomfort, dyspnea, palpitations, orthopnea, syncope, edema, early satiety.   Inpatient Medications    . allopurinol  100 mg Oral BID  . aspirin EC  162 mg Oral Daily  . atorvastatin  20 mg Oral Daily  . enoxaparin (LOVENOX) injection  40 mg Subcutaneous Q24H  . feeding supplement  237 mL Oral TID BM  . fenofibrate  54 mg Oral Daily  . ferrous sulfate  325 mg Oral Q  breakfast  . fluticasone  1 spray Each Nare BID  . folic acid  1 mg Oral Daily  . loratadine  10 mg Oral Daily  . multivitamin with minerals  1 tablet Oral Daily  . omega-3 acid ethyl esters  1 g Oral Daily  . pantoprazole  40 mg Oral Daily  . thiamine  100 mg Oral Daily   Or  . thiamine  100 mg Intravenous Daily  . triamcinolone   Topical BID    Family History    Family History  Problem Relation Age of Onset  . Diabetes Mother   . Hypertension Mother   . Diabetes Father   . Hypertension Father   . Diabetes Sister   . Dementia Brother   . Cancer Neg Hx   . Heart disease Neg Hx   . Stroke Neg Hx   .  Colon cancer Neg Hx   . Esophageal cancer Neg Hx   . Rectal cancer Neg Hx   . Stomach cancer Neg Hx    He indicated that his mother is deceased. He indicated that his father is deceased. He indicated that the status of his sister is unknown. He indicated that the status of his brother is unknown. He indicated that his maternal grandmother is deceased. He indicated that his maternal grandfather is deceased. He indicated that his paternal grandmother is deceased. He indicated that his paternal grandfather is deceased. He indicated that the status of his neg hx is unknown.   Social History    Social History   Socioeconomic History  . Marital status: Single    Spouse name: Not on file  . Number of children: 0  . Years of education: 35  . Highest education level: High school graduate  Occupational History  . Occupation: Retired  Tobacco Use  . Smoking status: Current Every Day Smoker    Packs/day: 0.50    Years: 30.00    Pack years: 15.00    Types: Cigarettes  . Smokeless tobacco: Never Used  Vaping Use  . Vaping Use: Never used  Substance and Sexual Activity  . Alcohol use: Yes    Alcohol/week: 1.0 standard drink    Types: 1 Glasses of wine per week    Comment: occasionally  . Drug use: Yes    Types: Marijuana  . Sexual activity: Yes    Birth control/protection:  Condom  Other Topics Concern  . Not on file  Social History Narrative  . Not on file   Social Determinants of Health   Financial Resource Strain: Low Risk   . Difficulty of Paying Living Expenses: Not hard at all  Food Insecurity: No Food Insecurity  . Worried About Charity fundraiser in the Last Year: Never true  . Ran Out of Food in the Last Year: Never true  Transportation Needs: No Transportation Needs  . Lack of Transportation (Medical): No  . Lack of Transportation (Non-Medical): No  Physical Activity: Inactive  . Days of Exercise per Week: 0 days  . Minutes of Exercise per Session: 0 min  Stress: No Stress Concern Present  . Feeling of Stress : Only a little  Social Connections: Moderately Integrated  . Frequency of Communication with Friends and Family: More than three times a week  . Frequency of Social Gatherings with Friends and Family: Three times a week  . Attends Religious Services: More than 4 times per year  . Active Member of Clubs or Organizations: Yes  . Attends Archivist Meetings: More than 4 times per year  . Marital Status: Never married  Intimate Partner Violence: Not At Risk  . Fear of Current or Ex-Partner: No  . Emotionally Abused: No  . Physically Abused: No  . Sexually Abused: No     Review of Systems    General:  No chills, fever, night sweats. Reports at least 15 lb weight loss over last several months. Cardiovascular:  No chest pain, dyspnea on exertion, edema, orthopnea, palpitations, paroxysmal nocturnal dyspnea. Dermatological: Itching all over. No rash, lesions/masses Respiratory: No cough, dyspnea Urologic: No hematuria, dysuria Abdominal:   No nausea, vomiting, diarrhea, bright red blood per rectum, melena, or hematemesis Neurologic:  No visual changes, wkns, changes in mental status. All other systems reviewed and are otherwise negative except as noted above.  Physical Exam    Vitals:   05/27/20 0746 05/27/20  1212   BP: 107/73 105/79  Pulse: 93 88  Resp: 18 18  Temp: 98 F (36.7 C) 98.1 F (36.7 C)  SpO2: 99% 99%   General: Thin gentleman who appears malnourished, pleasant, NAD Psych: Normal affect. Neuro: Alert and oriented X 3. Moves all extremities spontaneously. HEENT: Normal  Neck: Supple without bruits or JVD. Lungs:  Resp regular and unlabored, diminished breath sounds and expiratory wheeze noted bilaterally upon auscultation.  Heart: RRR no s3, s4, or murmurs. Abdomen: Soft, non-tender, non-distended, BS + x 4.  Extremities: No clubbing, cyanosis or edema. DP/PT2+, Radials 2+ and equal bilaterally.  Labs    Cardiac Enzymes Recent Labs  Lab 05/26/20 1119 05/26/20 1715  TROPONINIHS 11 12      Lab Results  Component Value Date   WBC 2.6 (L) 05/27/2020   HGB 7.9 (L) 05/27/2020   HCT 24.2 (L) 05/27/2020   MCV 102.5 (H) 05/27/2020   PLT 126 (L) 05/27/2020    Recent Labs  Lab 05/26/20 1715 05/27/20 0632  NA 141 141  K 3.7 3.4*  CL 105 106  CO2 25 25  BUN 18 17  CREATININE 2.01* 1.75*  CALCIUM 8.3* 7.8*  PROT 6.0*  --   BILITOT 1.3*  --   ALKPHOS 61  --   ALT 39  --   AST 82*  --   GLUCOSE 122* 120*   Lab Results  Component Value Date   CHOL 150 11/09/2019   HDL 47 11/09/2019   LDLCALC 88 11/09/2019   TRIG 77 11/09/2019     Radiology Studies    DG Chest Portable 1 View  Result Date: 05/26/2020 CLINICAL DATA:  Chest pain EXAM: PORTABLE CHEST 1 VIEW COMPARISON:  11/20/2019 FINDINGS: Cardiac shadow is within normal limits. The lungs are well aerated bilaterally. No focal infiltrate or effusion is seen. No acute bony abnormality is noted. Right shoulder replacement is seen. IMPRESSION: No active disease. Electronically Signed   By: Inez Catalina M.D.   On: 05/26/2020 11:50    Telemetry     Telemetry RSR @ 98, LAD, inferolateral ST dep/TWI - similar to 12/17/2019 ECG - personally reviewed.  Assessment & Plan    1. Pre-syncope/Hypotension: BP improved this  morning but remains soft off of Entresto, carvedilol, spironolactone. He has had significant weight loss and anorexia over the past few weeks/months. Would like to reinitiate medications for NICM/HFrEF with careful observation of lightheadedness/orthostasis, but hold off for today. Perhaps can resume low dose carvedilol in AM if pressures stable today. Continue to offer po hydration and nutrition to add weight and improve volume status.   2. Nonischemic cardiomyopathy/HFrEF w/LVEF 45-50% on last echo Aug 2021: He appears dry on exam. Not very active at home, walks a few blocks each day without dyspnea. It is unclear if he is fully compliant with his medications. As above, will hold off on resuming GDMT at this time in the setting of ongoing soft BPs.  Perhaps we can start by adding back a lower dose of carvedilol tomorrow - 3.125 BID, if pressure remains stable today.  Plan for close outpatient follow-up and addition of ARNI (or perhaps just ARB) and/or MRA at that time.    3. AKI: SCr has decreased to 1.75, down from 2.37 yesterday. He appears dry on exam. He is eating and drinking now and we need to continue to encourage po intake. Reports good urine output but measurements not recorded. He cannot tolerate Ensure due to dairy intolerance, need to  offer substitute. Follow - avoiding ARNI/MRA @ this time.  4. Abnl ECG: Asked to eval inferolateral ST dep and TWI.  This was also seen on 12/17/2019 ECG. HsT nl.  No further w/u required.  5. Chronic anemia/h/o GI Bleed: Hgb 7.9 today. H/o EGD in Sept w/o active bleed but gastric ulcer/inflammation/erosions, and esophagitis noted.  He reports compliance w/ PPI.  Denies melena.  Some drop in H/H may be related to dilution/IV hydration.  Ok to hold ASA if necessary.  Cont PPI.  Per IM.  6. Substance abuse disorder: He reports drinking beer and taking shots "while watching the game" last weekend. Denies daily alcohol consumption. Says, "that is probably what put me  back in the hospital." Continues to smoke cigarettes. Complete cessation advised.        Signed, Murray Hodgkins, NP 05/27/2020, 2:33 PM  For questions or updates, please contact   Please consult [email or web address] for contact info under Cardiology/STEMI.

## 2020-05-26 NOTE — ED Provider Notes (Signed)
[facility] Emergency Department Provider Note    Event Date/Time   First MD Initiated Contact with Patient 05/26/20 1117     (approximate)  I have reviewed the triage vital signs and the nursing notes.   HISTORY  Chief Complaint Chest Pain (shouloders)    HPI Travis [name] is a 65 y.o. male below listed past medical history presents to the ER for evaluation of chest pain going through to his shoulder blades.  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  Was being seen at cardiology clinic today started having active chest pain while being seen and was sent over to the ER for further evaluation.  No diaphoresis.  States the pain is mild to moderate.  Does not feel like it was no ripping or tearing through to his back.   Past Medical History:  Diagnosis Date  . Alcohol abuse   . 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 05/2019 Echo: EF 35-40%; b. 10/2019 Ehco: EF 45-50%.  . Allergy   . Asthma   . 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 Chronic anemia   . Clotting disorder ([place])   . Diabetes mellitus, type 2 ([place])    pt reports his DM is gone  . GI bleed    15 years ago  . Gout   . Hemoptysis    secondary to pulmonary edema  . HFrEF (heart failure with reduced ejection fraction) ([place])    a. 12/2010 Echo: EF 20-25%; b 08/2013 Echo: EF 45-50%; c. 01/2015 Echo: EF 25-30%; d. 07/2016 Echo: EF 35-40%; e. 05/2019 Echo: EF 35-40%; d. 10/2019 Echo: EF 45-50%, mild LVH, mild red RV fxn, mildl dil RA, Triv MR. Mild to mod Ao Sclerosis w/o stenosis.  . Hyperlipidemia   . Hypertension   . Nonischemic cardiomyopathy ([place])   . Nonobstructive Coronary artery disease    a. 01/2015 Cath: LM nl, LAD 30p/m, LCX nl, RCA 10p/m, RPDA min irregs.  . Osteoarthritis   . Pneumonia   . TIA (transient ischemic attack)   . Tobacco abuse   . Vision loss    peripherial vision only left eye.Central Retinal  artery occusion    Family  History  Problem Relation Age of Onset  . Diabetes Mother   . Hypertension Mother   . Diabetes Father   . Hypertension Father   . Diabetes Sister   . Dementia Brother   . Cancer Neg Hx   . Heart disease Neg Hx   . Stroke Neg Hx   . Colon cancer Neg Hx   . Esophageal cancer Neg Hx   . Rectal cancer Neg Hx   . Stomach cancer Neg Hx    Past Surgical History:  Procedure Laterality Date  . BIOPSY  11/25/2019   Procedure: BIOPSY;  Surgeon: Ladene Artist, MD;  Location: [place];  Service: Endoscopy;;  . CARDIAC CATHETERIZATION    . CARDIAC CATHETERIZATION N/A 01/28/2015   Procedure: Left Heart Cath and Coronary Angiography;  Surgeon: Wellington Hampshire, MD;  Location: [place] [facility];  Service: Cardiovascular;  Laterality: N/A;  . COLONOSCOPY    . ESOPHAGOGASTRODUODENOSCOPY (EGD) WITH PROPOFOL N/A 11/25/2019   Procedure: ESOPHAGOGASTRODUODENOSCOPY (EGD) WITH PROPOFOL;  Surgeon: Ladene Artist, MD;  Location: [facility] ENDOSCOPY;  Service: Endoscopy;  Laterality: N/A;  . HIP ARTHROPLASTY Right 03/15/2013   Procedure: ARTHROPLASTY BIPOLAR HIP;  Surgeon: [name], MD;  Location: [place];  Service: Orthopedics;  Laterality: Right;  . TOTAL SHOULDER ARTHROPLASTY Right 09/01/2015   Procedure: RIGHT TOTAL SHOULDER ARTHROPLASTY;  Surgeon: [name], MD;  Location: [place];  Service: Orthopedics;  Laterality: Right;   Patient Active Problem List   Diagnosis Date Noted  . Hypotension 05/26/2020  . AKI (acute kidney injury) ([place]) 05/26/2020  . Alcohol use 11/20/2019  . CKD (chronic kidney disease), stage III ([place]) 11/20/2019  . TIA (transient ischemic attack) 11/08/2019  . Osteoarthritis 05/09/2016  . COPD (chronic obstructive pulmonary disease) ([place]) 05/09/2016  . Essential hypertension 04/18/2015  . Literacy level of illiterate 06/04/2014  . 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 Cardiomyopathy, nonischemic ([place]) 03/15/2013  . Chronic systolic heart failure ([place])  01/05/2011  . Tobacco use 01/05/2011  . Diabetes ([place]) 11/30/2008  . HLD (hyperlipidemia) 11/30/2008  . Gout 11/30/2008      Prior to Admission medications   Medication Sig Start Date End Date Taking? Authorizing Provider  albuterol (VENTOLIN HFA) 108 (90 Base) MCG/ACT inhaler Inhale 2 puffs into the lungs every 6 (six) hours as needed for wheezing or shortness of breath.   Yes [provider]  allopurinol (ZYLOPRIM) 100 MG tablet Take 100 mg by mouth 2 (two) times daily.   Yes [provider]  aspirin EC 81 MG tablet Take 81 mg by mouth daily.   Yes [provider]  atorvastatin (LIPITOR) 20 MG tablet Take 1 tablet (20 mg total) by mouth daily. 02/09/20 05/09/20 Yes Hackney, Otila Kluver A, FNP  carvedilol (COREG) 12.5 MG tablet Take 6.25 mg by mouth 2 (two) times daily with a meal.   Yes [provider]  cetirizine (ZYRTEC) 10 MG tablet TAKE 1 TABLET BY MOUTH DAILY 07/22/19  Yes [name], NP  fenofibrate (TRICOR) 145 MG tablet TAKE 1 TABLET BY MOUTH ONCE A DAY 05/13/20  Yes [name], NP  ferrous sulfate 325 (65 FE) MG tablet Take 325 mg by mouth daily with breakfast.   Yes [provider]  fluticasone (FLONASE) 50 MCG/ACT nasal spray Place 1 spray into both nostrils 2 (two) times daily. 03/29/16  Yes Cuthriell, Charline Bills, PA-C  Multiple Vitamin (MULTIVITAMIN WITH MINERALS) TABS tablet Take 1 tablet by mouth daily. Men's One a Day   Yes [provider]  Omega-3 Fatty Acids (FISH OIL PO) Take 1 capsule by mouth daily.   Yes [provider]  pantoprazole (PROTONIX) 40 MG tablet Take 1 tablet (40 mg total) by mouth daily. 12/11/19 03/10/20 Yes Hall, Carole N, DO  sacubitril-valsartan (ENTRESTO) 24-26 MG Take 1 tablet by mouth 2 (two) times daily. 04/15/20  Yes Hackney, Otila Kluver A, FNP  spironolactone (ALDACTONE) 25 MG tablet Take 12.5 mg by mouth daily.   Yes [provider]  triamcinolone (KENALOG) 0.1 % APPLY TO AFFECTED  AREAS TWICE A DAY AS NEEDED. Osborn Coho, OR UNDERARMS 05/13/20  Yes [name], NP    Allergies Patient has no known allergies.    Social History Social History   Tobacco Use  . Smoking status: Current Every Day Smoker    Packs/day: 0.50    Years: 30.00    Pack years: 15.00    Types: Cigarettes  . Smokeless tobacco: Never Used  Vaping Use  . Vaping Use: Never used  Substance Use Topics  . Alcohol use: Yes    Alcohol/week: 1.0 standard drink    Types: 1 Glasses of wine per week    Comment: occasionally  . Drug use: Yes  Types: Marijuana    Review of Systems Patient denies headaches, rhinorrhea, blurry vision, numbness, shortness of breath, chest pain, edema, cough, abdominal pain, nausea, vomiting, diarrhea, dysuria, fevers, rashes or hallucinations unless otherwise stated above in HPI. ____________________________________________   PHYSICAL EXAM:  VITAL SIGNS: Vitals:   05/26/20 1230 05/26/20 1500  BP: 96/66 104/68  Pulse: 97 94  Resp: 18 (!) 21  Temp:    SpO2: 100% 96%    Constitutional: Alert and oriented.  Eyes: Conjunctivae are normal.  Head: Atraumatic. Nose: No congestion/rhinnorhea. Mouth/Throat: Mucous membranes are moist.   Neck: No stridor. Painless ROM.  Cardiovascular: Normal rate, regular rhythm. Grossly normal heart sounds.  Good peripheral circulation. Respiratory: Normal respiratory effort.  No retractions. Lungs CTAB. Gastrointestinal: Soft and nontender. No distention. No abdominal bruits. No CVA tenderness. Genitourinary:  Musculoskeletal: No lower extremity tenderness nor edema.  No joint effusions. Neurologic:  Normal speech and language. No gross focal neurologic deficits are appreciated. No facial droop Skin:  Skin is warm, dry and intact. No rash noted. Psychiatric: Mood and affect are normal. Speech and behavior are normal.  ____________________________________________   LABS (all labs ordered are listed, but only  abnormal results are displayed)  Results for orders placed or performed during the hospital encounter of 05/26/20 (from the past 24 hour(s))  CBC with Differential     Status: Abnormal   Collection Time: 05/26/20 11:19 AM  Result Value Ref Range   WBC 4.1 4.0 - 10.5 K/uL   RBC 2.90 (L) 4.22 - 5.81 MIL/uL   Hemoglobin 9.8 (L) 13.0 - 17.0 g/dL   HCT 28.9 (L) 39.0 - 52.0 %   MCV 99.7 80.0 - 100.0 fL   MCH 33.8 26.0 - 34.0 pg   MCHC 33.9 30.0 - 36.0 g/dL   RDW 18.1 (H) 11.5 - 15.5 %   Platelets 162 150 - 400 K/uL   nRBC 1.0 (H) 0.0 - 0.2 %   Neutrophils Relative % 54 %   Neutro Abs 2.3 1.7 - 7.7 K/uL   Lymphocytes Relative 20 %   Lymphs Abs 0.8 0.7 - 4.0 K/uL   Monocytes Relative 9 %   Monocytes Absolute 0.4 0.1 - 1.0 K/uL   Eosinophils Relative 15 %   Eosinophils Absolute 0.6 (H) 0.0 - 0.5 K/uL   Basophils Relative 1 %   Basophils Absolute 0.0 0.0 - 0.1 K/uL   Immature Granulocytes 1 %   Abs Immature Granulocytes 0.02 0.00 - 0.07 K/uL  Comprehensive metabolic panel     Status: Abnormal   Collection Time: 05/26/20 11:19 AM  Result Value Ref Range   Sodium 140 135 - 145 mmol/L   Potassium 3.8 3.5 - 5.1 mmol/L   Chloride 101 98 - 111 mmol/L   CO2 24 22 - 32 mmol/L   Glucose, Bld 158 (H) 70 - 99 mg/dL   BUN 19 8 - 23 mg/dL   Creatinine, Ser 2.37 (H) 0.61 - 1.24 mg/dL   Calcium 8.8 (L) 8.9 - 10.3 mg/dL   Total Protein 6.4 (L) 6.5 - 8.1 g/dL   Albumin 3.2 (L) 3.5 - 5.0 g/dL   AST 82 (H) 15 - 41 U/L   ALT 40 0 - 44 U/L   Alkaline Phosphatase 63 38 - 126 U/L   Total Bilirubin 1.4 (H) 0.3 - 1.2 mg/dL   GFR, Estimated 30 (L) >60 mL/min   Anion gap 15 5 - 15  Troponin I (High Sensitivity)     Status: None  Collection Time: 05/26/20 11:19 AM  Result Value Ref Range   Troponin I (High Sensitivity) 11 <18 ng/L   ____________________________________________  EKG  My review and personal interpretation at Time: 11:10   Indication: chest pain  Rate: 110  Rhythm: sinus Axis:  normal Other: inferolateral t-wave inversions, poor r wave progression ____________________________________________  RADIOLOGY  I personally reviewed all radiographic images ordered to evaluate for the above acute complaints and reviewed radiology reports and findings.  These findings were personally discussed with the patient.  Please see medical record for radiology report.  ____________________________________________   PROCEDURES  Procedure(s) performed:  Procedures    Critical Care performed: no ____________________________________________   INITIAL IMPRESSION / ASSESSMENT AND PLAN / ED COURSE  Pertinent labs & imaging results that were available during my care of the patient were reviewed by me and considered in my medical decision making (see chart for details).   DDX: ACS, pericarditis, esophagitis, boerhaaves, pe, dissection, pna, bronchitis, costochondritis   Travis [name] is a 65 y.o. who presents to the ED with symptoms as described above.  Mildly hypertensive EKG with some T wave inversions as compared to previous.  Is having some pain.  Have a lower suspicion for dissection.  Will check troponin.  May be having some demand ischemia in the setting of hypotension.  We will continue to monitor.  Will consult cardiology.  Clinical Course as of 05/26/20 1526  Thu May 26, 2020  1215 Chest x-ray reassuring.  Blood pressure improving with fluids.  Patient may have simply become over diuresed or dehydrated reporting 15 pound weight loss.  Given his history of CHF and alcoholic cardiomyopathy with new EKG changes will discuss with hospitalist for observation for serial enzymes.  His initial troponin is normal we will hold off on heparinization unless troponin is elevated on subsequent testing. [PR]    Clinical Course User Index [PR] Merlyn Lot, MD    The patient was evaluated in Emergency Department today for the symptoms described in the history of present illness.  He/she was evaluated in the context of the global COVID-19 pandemic, which necessitated consideration that the patient might be at risk for infection with the SARS-CoV-2 virus that causes COVID-19. Institutional protocols and algorithms that pertain to the evaluation of patients at risk for COVID-19 are in a state of rapid change based on information released by regulatory bodies including the CDC and federal and state organizations. These policies and algorithms were followed during the patient's care in the ED.  As part of my medical decision making, I reviewed the following data within the [place] notes reviewed and incorporated, Labs reviewed, notes from prior ED visits and [place] Controlled Substance Database   ____________________________________________   FINAL CLINICAL IMPRESSION(S) / ED DIAGNOSES  Final diagnoses:  Nonspecific chest pain  Hypotension, unspecified hypotension type      NEW MEDICATIONS STARTED DURING THIS VISIT:  New Prescriptions   No medications on file     Note:  This document was prepared using Dragon voice recognition software and may include unintentional dictation errors.    Merlyn Lot, MD 05/26/20 [phone]

## 2020-05-26 NOTE — ED Triage Notes (Signed)
Was goinf to regular heart appt and started having chest pain into shoulders.  They sent him here for hypotension.  lightheadeded

## 2020-05-26 NOTE — ED Provider Notes (Incomplete)
[facility] Emergency Department Provider Note    Event Date/Time   First MD Initiated Contact with Patient 05/26/20 1117     (approximate)  I have reviewed the triage vital signs and the nursing notes.   HISTORY  Chief Complaint Chest Pain (shouloders)    HPI Travis [name] is a 65 y.o. male below listed past medical history presents to the ER for evaluation of chest pain going through to his shoulder blades.  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  Was being seen at cardiology clinic today started having active chest pain while being seen and was sent over to the ER for further evaluation.  No diaphoresis.  States the pain is mild to moderate.  Does not feel like it was no ripping or tearing through to his back.    Past Medical History:  Diagnosis Date  . Alcohol abuse   . 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 05/2019 Echo: EF 35-40%; b. 10/2019 Ehco: EF 45-50%.  . Allergy   . Asthma   . 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 Chronic anemia   . Clotting disorder ([place])   . Diabetes mellitus, type 2 ([place])    pt reports his DM is gone  . GI bleed    15 years ago  . Gout   . Hemoptysis    secondary to pulmonary edema  . HFrEF (heart failure with reduced ejection fraction) ([place])    a. 12/2010 Echo: EF 20-25%; b 08/2013 Echo: EF 45-50%; c. 01/2015 Echo: EF 25-30%; d. 07/2016 Echo: EF 35-40%; e. 05/2019 Echo: EF 35-40%; d. 10/2019 Echo: EF 45-50%, mild LVH, mild red RV fxn, mildl dil RA, Triv MR. Mild to mod Ao Sclerosis w/o stenosis.  . Hyperlipidemia   . Hypertension   . Nonischemic cardiomyopathy ([place])   . Nonobstructive Coronary artery disease    a. 01/2015 Cath: LM nl, LAD 30p/m, LCX nl, RCA 10p/m, RPDA min irregs.  . Osteoarthritis   . Pneumonia   . TIA (transient ischemic attack)   . Tobacco abuse   . Vision loss    peripherial vision only left eye.Central Retinal  artery occusion    Family  History  Problem Relation Age of Onset  . Diabetes Mother   . Hypertension Mother   . Diabetes Father   . Hypertension Father   . Diabetes Sister   . Dementia Brother   . Cancer Neg Hx   . Heart disease Neg Hx   . Stroke Neg Hx   . Colon cancer Neg Hx   . Esophageal cancer Neg Hx   . Rectal cancer Neg Hx   . Stomach cancer Neg Hx    Past Surgical History:  Procedure Laterality Date  . BIOPSY  11/25/2019   Procedure: BIOPSY;  Surgeon: Ladene Artist, MD;  Location: [place];  Service: Endoscopy;;  . CARDIAC CATHETERIZATION    . CARDIAC CATHETERIZATION N/A 01/28/2015   Procedure: Left Heart Cath and Coronary Angiography;  Surgeon: Wellington Hampshire, MD;  Location: [place] [facility];  Service: Cardiovascular;  Laterality: N/A;  . COLONOSCOPY    . ESOPHAGOGASTRODUODENOSCOPY (EGD) WITH PROPOFOL N/A 11/25/2019   Procedure: ESOPHAGOGASTRODUODENOSCOPY (EGD) WITH PROPOFOL;  Surgeon: Ladene Artist, MD;  Location: [facility] ENDOSCOPY;  Service: Endoscopy;  Laterality: N/A;  . HIP ARTHROPLASTY Right 03/15/2013   Procedure: ARTHROPLASTY BIPOLAR HIP;  Surgeon: [name], MD;  Location: [place];  Service:  Orthopedics;  Laterality: Right;  . TOTAL SHOULDER ARTHROPLASTY Right 09/01/2015   Procedure: RIGHT TOTAL SHOULDER ARTHROPLASTY;  Surgeon: [name], MD;  Location: [place];  Service: Orthopedics;  Laterality: Right;   Patient Active Problem List   Diagnosis Date Noted  . Alcohol use 11/20/2019  . CKD (chronic kidney disease), stage III ([place]) 11/20/2019  . TIA (transient ischemic attack) 11/08/2019  . Osteoarthritis 05/09/2016  . COPD (chronic obstructive pulmonary disease) ([place]) 05/09/2016  . Essential hypertension 04/18/2015  . Literacy level of illiterate 06/04/2014  . 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 Cardiomyopathy, nonischemic ([place]) 03/15/2013  . Chronic systolic heart failure ([place]) 01/05/2011  . Tobacco use 01/05/2011  . Diabetes ([place]) 11/30/2008  .  HLD (hyperlipidemia) 11/30/2008  . Gout 11/30/2008      Prior to Admission medications   Medication Sig Start Date End Date Taking? Authorizing Provider  albuterol (PROVENTIL HFA) 108 (90 Base) MCG/ACT inhaler Inhale 2 puffs into the lungs every 6 (six) hours as needed for wheezing or shortness of breath.    [provider]  allopurinol (ZYLOPRIM) 100 MG tablet Take 100 mg by mouth 2 (two) times daily.    [provider]  aspirin EC 325 MG tablet Take 162.5 mg by mouth daily.    [provider]  atorvastatin (LIPITOR) 20 MG tablet Take 1 tablet (20 mg total) by mouth daily. 02/09/20 05/09/20  Alisa Graff, FNP  carvedilol (COREG) 12.5 MG tablet Take 6.25 mg by mouth 2 (two) times daily with a meal.    [provider]  cetirizine (ZYRTEC) 10 MG tablet TAKE 1 TABLET BY MOUTH DAILY 07/22/19   [name], NP  fenofibrate (TRICOR) 145 MG tablet TAKE 1 TABLET BY MOUTH ONCE A DAY 05/13/20   [name], NP  ferrous sulfate 325 (65 FE) MG tablet Take 325 mg by mouth daily with breakfast.    [provider]  fluticasone (FLONASE) 50 MCG/ACT nasal spray Place 1 spray into both nostrils 2 (two) times daily. 03/29/16   Cuthriell, Charline Bills, PA-C  Multiple Vitamin (MULTIVITAMIN WITH MINERALS) TABS tablet Take 1 tablet by mouth daily. Men's One a Day    [provider]  Omega-3 Fatty Acids (FISH OIL PO) Take 1 capsule by mouth daily.    [provider]  pantoprazole (PROTONIX) 40 MG tablet Take 1 tablet (40 mg total) by mouth daily. 12/11/19 03/10/20  Kayleen Memos, DO  sacubitril-valsartan (ENTRESTO) 24-26 MG Take 1 tablet by mouth 2 (two) times daily. 04/15/20   Alisa Graff, FNP  spironolactone (ALDACTONE) 25 MG tablet Take 12.5 mg by mouth daily.    [provider]  triamcinolone (KENALOG) 0.1 % APPLY TO AFFECTED AREAS TWICE A DAY AS NEEDED. [place] Coho, OR UNDERARMS 05/13/20   [name], NP     Allergies Patient has no known allergies.    Social History Social History   Tobacco Use  . Smoking status: Current Every Day Smoker    Packs/day: 0.50    Years: 30.00    Pack years: 15.00    Types: Cigarettes  . Smokeless tobacco: Never Used  Vaping Use  . Vaping Use: Never used  Substance Use Topics  . Alcohol use: Yes    Alcohol/week: 1.0 standard drink    Types: 1 Glasses of wine per week    Comment: occasionally  . Drug use: Yes    Types: Marijuana    Review of Systems Patient denies  headaches, rhinorrhea, blurry vision, numbness, shortness of breath, chest pain, edema, cough, abdominal pain, nausea, vomiting, diarrhea, dysuria, fevers, rashes or hallucinations unless otherwise stated above in HPI. ____________________________________________   PHYSICAL EXAM:  VITAL SIGNS: Vitals:   05/26/20 1120 05/26/20 1130  BP: 96/61 98/69  Pulse:    Resp: 12 15  Temp: 98.7 F (37.1 C)     Constitutional: Alert and oriented.  Eyes: Conjunctivae are normal.  Head: Atraumatic. Nose: No congestion/rhinnorhea. Mouth/Throat: Mucous membranes are moist.   Neck: No stridor. Painless ROM.  Cardiovascular: Normal rate, regular rhythm. Grossly normal heart sounds.  Good peripheral circulation. Respiratory: Normal respiratory effort.  No retractions. Lungs CTAB. Gastrointestinal: Soft and nontender. No distention. No abdominal bruits. No CVA tenderness. Genitourinary:  Musculoskeletal: No lower extremity tenderness nor edema.  No joint effusions. Neurologic:  Normal speech and language. No gross focal neurologic deficits are appreciated. No facial droop Skin:  Skin is warm, dry and intact. No rash noted. Psychiatric: Mood and affect are normal. Speech and behavior are normal.  ____________________________________________   LABS (all labs ordered are listed, but only abnormal results are displayed)  Results for orders placed or performed during the hospital encounter  of 05/26/20 (from the past 24 hour(s))  CBC with Differential     Status: Abnormal   Collection Time: 05/26/20 11:19 AM  Result Value Ref Range   WBC 4.1 4.0 - 10.5 K/uL   RBC 2.90 (L) 4.22 - 5.81 MIL/uL   Hemoglobin 9.8 (L) 13.0 - 17.0 g/dL   HCT 28.9 (L) 39.0 - 52.0 %   MCV 99.7 80.0 - 100.0 fL   MCH 33.8 26.0 - 34.0 pg   MCHC 33.9 30.0 - 36.0 g/dL   RDW 18.1 (H) 11.5 - 15.5 %   Platelets 162 150 - 400 K/uL   nRBC 1.0 (H) 0.0 - 0.2 %   Neutrophils Relative % 54 %   Neutro Abs 2.3 1.7 - 7.7 K/uL   Lymphocytes Relative 20 %   Lymphs Abs 0.8 0.7 - 4.0 K/uL   Monocytes Relative 9 %   Monocytes Absolute 0.4 0.1 - 1.0 K/uL   Eosinophils Relative 15 %   Eosinophils Absolute 0.6 (H) 0.0 - 0.5 K/uL   Basophils Relative 1 %   Basophils Absolute 0.0 0.0 - 0.1 K/uL   Immature Granulocytes 1 %   Abs Immature Granulocytes 0.02 0.00 - 0.07 K/uL   ____________________________________________  EKG My review and personal interpretation at Time: 11:10   Indication: chest pain  Rate: 110  Rhythm: sinus Axis: normal Other: inferolateral t-wave inversions, poor r wave progression ____________________________________________  RADIOLOGY  *** ____________________________________________   PROCEDURES  Procedure(s) performed:  Procedures    Critical Care performed: ***no ____________________________________________   INITIAL IMPRESSION / ASSESSMENT AND PLAN / ED COURSE  Pertinent labs & imaging results that were available during my care of the patient were reviewed by me and considered in my medical decision making (see chart for details).   DDX: ***  Travis [name] is a 65 y.o. who presents to the ED with ***     The patient was evaluated in Emergency Department today for the symptoms described in the history of present illness. He/she was evaluated in the context of the global COVID-19 pandemic, which necessitated consideration that the patient might be at risk for infection  with the SARS-CoV-2 virus that causes COVID-19. Institutional protocols and algorithms that pertain to the evaluation of patients at risk for COVID-19 are in a  state of rapid change based on information released by regulatory bodies including the CDC and federal and state organizations. These policies and algorithms were followed during the patient's care in the ED.  As part of my medical decision making, I reviewed the following data within the [place] notes reviewed and incorporated, Labs reviewed, notes from prior ED visits and  Controlled Substance Database   ____________________________________________   FINAL CLINICAL IMPRESSION(S) / ED DIAGNOSES  Final diagnoses:  None      NEW MEDICATIONS STARTED DURING THIS VISIT:  New Prescriptions   No medications on file     Note:  This document was prepared using Dragon voice recognition software and may include unintentional dictation errors.

## 2020-05-26 NOTE — Patient Instructions (Signed)
The patient is being transferred to the ED due to symptomatic hypotension.

## 2020-05-27 ENCOUNTER — Encounter: Payer: Self-pay | Admitting: Internal Medicine

## 2020-05-27 DIAGNOSIS — D61818 Other pancytopenia: Secondary | ICD-10-CM | POA: Diagnosis present

## 2020-05-27 DIAGNOSIS — N183 Chronic kidney disease, stage 3 unspecified: Secondary | ICD-10-CM | POA: Diagnosis present

## 2020-05-27 DIAGNOSIS — E43 Unspecified severe protein-calorie malnutrition: Secondary | ICD-10-CM | POA: Diagnosis present

## 2020-05-27 DIAGNOSIS — I952 Hypotension due to drugs: Secondary | ICD-10-CM | POA: Diagnosis not present

## 2020-05-27 DIAGNOSIS — I2510 Atherosclerotic heart disease of native coronary artery without angina pectoris: Secondary | ICD-10-CM | POA: Diagnosis present

## 2020-05-27 DIAGNOSIS — F17200 Nicotine dependence, unspecified, uncomplicated: Secondary | ICD-10-CM | POA: Diagnosis present

## 2020-05-27 DIAGNOSIS — G8929 Other chronic pain: Secondary | ICD-10-CM | POA: Diagnosis present

## 2020-05-27 DIAGNOSIS — E876 Hypokalemia: Secondary | ICD-10-CM | POA: Diagnosis present

## 2020-05-27 DIAGNOSIS — T502X5A Adverse effect of carbonic-anhydrase inhibitors, benzothiadiazides and other diuretics, initial encounter: Secondary | ICD-10-CM | POA: Diagnosis present

## 2020-05-27 DIAGNOSIS — I472 Ventricular tachycardia: Secondary | ICD-10-CM | POA: Diagnosis not present

## 2020-05-27 DIAGNOSIS — I130 Hypertensive heart and chronic kidney disease with heart failure and stage 1 through stage 4 chronic kidney disease, or unspecified chronic kidney disease: Secondary | ICD-10-CM | POA: Diagnosis present

## 2020-05-27 DIAGNOSIS — F1010 Alcohol abuse, uncomplicated: Secondary | ICD-10-CM | POA: Diagnosis present

## 2020-05-27 DIAGNOSIS — M109 Gout, unspecified: Secondary | ICD-10-CM | POA: Diagnosis present

## 2020-05-27 DIAGNOSIS — Z20822 Contact with and (suspected) exposure to covid-19: Secondary | ICD-10-CM | POA: Diagnosis present

## 2020-05-27 DIAGNOSIS — I428 Other cardiomyopathies: Secondary | ICD-10-CM | POA: Diagnosis present

## 2020-05-27 DIAGNOSIS — M549 Dorsalgia, unspecified: Secondary | ICD-10-CM | POA: Diagnosis present

## 2020-05-27 DIAGNOSIS — N179 Acute kidney failure, unspecified: Secondary | ICD-10-CM | POA: Diagnosis present

## 2020-05-27 DIAGNOSIS — Z8673 Personal history of transient ischemic attack (TIA), and cerebral infarction without residual deficits: Secondary | ICD-10-CM | POA: Diagnosis not present

## 2020-05-27 DIAGNOSIS — I959 Hypotension, unspecified: Secondary | ICD-10-CM | POA: Diagnosis present

## 2020-05-27 DIAGNOSIS — I5022 Chronic systolic (congestive) heart failure: Secondary | ICD-10-CM | POA: Diagnosis present

## 2020-05-27 DIAGNOSIS — Z6820 Body mass index (BMI) 20.0-20.9, adult: Secondary | ICD-10-CM | POA: Diagnosis not present

## 2020-05-27 DIAGNOSIS — K259 Gastric ulcer, unspecified as acute or chronic, without hemorrhage or perforation: Secondary | ICD-10-CM | POA: Diagnosis present

## 2020-05-27 DIAGNOSIS — D631 Anemia in chronic kidney disease: Secondary | ICD-10-CM | POA: Diagnosis present

## 2020-05-27 DIAGNOSIS — K2100 Gastro-esophageal reflux disease with esophagitis, without bleeding: Secondary | ICD-10-CM | POA: Diagnosis present

## 2020-05-27 LAB — RETICULOCYTES
Immature Retic Fract: 29.4 % — ABNORMAL HIGH (ref 2.3–15.9)
RBC.: 2.31 MIL/uL — ABNORMAL LOW (ref 4.22–5.81)
Retic Count, Absolute: 22.4 10*3/uL (ref 19.0–186.0)
Retic Ct Pct: 1 % (ref 0.4–3.1)

## 2020-05-27 LAB — BASIC METABOLIC PANEL
Anion gap: 10 (ref 5–15)
BUN: 17 mg/dL (ref 8–23)
CO2: 25 mmol/L (ref 22–32)
Calcium: 7.8 mg/dL — ABNORMAL LOW (ref 8.9–10.3)
Chloride: 106 mmol/L (ref 98–111)
Creatinine, Ser: 1.75 mg/dL — ABNORMAL HIGH (ref 0.61–1.24)
GFR, Estimated: 43 mL/min — ABNORMAL LOW (ref >60)
Glucose, Bld: 120 mg/dL — ABNORMAL HIGH (ref 70–99)
Potassium: 3.4 mmol/L — ABNORMAL LOW (ref 3.5–5.1)
Sodium: 141 mmol/L (ref 135–145)

## 2020-05-27 LAB — CBC
HCT: 24.2 % — ABNORMAL LOW (ref 39.0–52.0)
Hemoglobin: 7.9 g/dL — ABNORMAL LOW (ref 13.0–17.0)
MCH: 33.5 pg (ref 26.0–34.0)
MCHC: 32.6 g/dL (ref 30.0–36.0)
MCV: 102.5 fL — ABNORMAL HIGH (ref 80.0–100.0)
Platelets: 126 10*3/uL — ABNORMAL LOW (ref 150–400)
RBC: 2.36 MIL/uL — ABNORMAL LOW (ref 4.22–5.81)
RDW: 18 % — ABNORMAL HIGH (ref 11.5–15.5)
WBC: 2.6 10*3/uL — ABNORMAL LOW (ref 4.0–10.5)
nRBC: 0 % (ref 0.0–0.2)

## 2020-05-27 LAB — PSA: Prostatic Specific Antigen: 2.03 ng/mL (ref 0.00–4.00)

## 2020-05-27 LAB — TSH: TSH: 0.392 u[IU]/mL (ref 0.350–4.500)

## 2020-05-27 LAB — FOLATE: Folate: 15.5 ng/mL (ref >5.9)

## 2020-05-27 LAB — OCCULT BLOOD X 1 CARD TO LAB, STOOL: Fecal Occult Bld: NEGATIVE

## 2020-05-27 LAB — VITAMIN B12: Vitamin B-12: 394 pg/mL (ref 180–914)

## 2020-05-27 LAB — SARS CORONAVIRUS 2 (TAT 6-24 HRS): SARS Coronavirus 2: NEGATIVE

## 2020-05-27 LAB — SAVE SMEAR(SSMR), FOR PROVIDER SLIDE REVIEW

## 2020-05-27 LAB — PATHOLOGIST SMEAR REVIEW

## 2020-05-27 MED ORDER — LORATADINE 10 MG PO TABS
10.0000 mg | ORAL_TABLET | Freq: Every day | ORAL | Status: DC
  Administered 2020-05-28: 10 mg via ORAL
  Filled 2020-05-27: qty 1

## 2020-05-27 MED ORDER — MAGNESIUM SULFATE 2 GM/50ML IV SOLN
2.0000 g | Freq: Once | INTRAVENOUS | Status: AC
  Administered 2020-05-27: 2 g via INTRAVENOUS
  Filled 2020-05-27: qty 50

## 2020-05-27 MED ORDER — POTASSIUM CHLORIDE CRYS ER 20 MEQ PO TBCR
40.0000 meq | EXTENDED_RELEASE_TABLET | Freq: Once | ORAL | Status: AC
  Administered 2020-05-27: 40 meq via ORAL
  Filled 2020-05-27: qty 2

## 2020-05-27 MED ORDER — ENSURE ENLIVE PO LIQD
237.0000 mL | Freq: Three times a day (TID) | ORAL | Status: DC
  Administered 2020-05-27: 237 mL via ORAL

## 2020-05-27 MED ORDER — ADULT MULTIVITAMIN W/MINERALS CH
1.0000 | ORAL_TABLET | Freq: Every day | ORAL | Status: DC
  Administered 2020-05-28: 1 via ORAL
  Filled 2020-05-27: qty 1

## 2020-05-27 MED ORDER — TRIAMCINOLONE ACETONIDE 0.1 % EX CREA
TOPICAL_CREAM | Freq: Two times a day (BID) | CUTANEOUS | Status: DC
  Filled 2020-05-27: qty 15

## 2020-05-27 NOTE — Progress Notes (Signed)
PROGRESS NOTE    CRUE TRAUGH  [id number] DOB: [birth year] DOA: 05/26/2020 PCP: [name], NP  Outpatient Specialists: [facility]    Brief Narrative:   Travis [name] is a 65 y.o. male with medical history significant for nonischemic cardiomyopathy secondary to alcohol abuse with last known LVEF of 45 to 50%, history of TIA, hypertension, anemia of chronic disease and nonobstructive coronary artery disease who was sent to the emergency room from his cardiologist office for evaluation.  Patient had gone to see his cardiologist for routine follow-up appointment and complained of feeling dizzy and lightheaded. He also complained of discomfort in his mid back back between his shoulder blades as well as a 15 pound weight loss since December, 2021.  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  He states that his appetite is poor.  He denied having any chest pain. Patient was noted to have a blood pressure of 74/50 in his cardiologist office and so he was sent to the ER for further evaluation. He denied having any nausea, no vomiting, no palpitations, no diaphoresis, no headache, no blurred vision, no cough, no fever, no chills, no diarrhea, no constipation, no abdominal pain, no urinary frequency, no nocturia, no dysuria, no falls.   Assessment & Plan:   Principal Problem:   Hypotension Active Problems:   Chronic systolic heart failure (HCC)   Tobacco use   Cardiomyopathy, nonischemic (HCC)   Essential hypertension   Alcohol use   CKD (chronic kidney disease), stage III (HCC)   AKI (acute kidney injury) ([place])   # Symptomatic hypotension Most likely medication induced. BP and symptoms improved w/ holding home meds - home carvedilol, spironolactone, and entresto held - cardiology consulted, recs pending  # Etoh abuse With history withdrawal, denies hx complicated withdrawal. Today mildly tremulous. Last drink 3/2. Denies drug  use - continue ciwa with lorazepam - thiamine supplementation, MVI  # Chronic systolic heart failure Secondary to nonischemic cardiomyopathy most likely related to alcohol abuse. Last known LVEF of 45 to 50% - holding Entresto, spironolactone and carvedilol for now - cardiology recs pending  # Anemia # Esophagitis, gastric ulcer # Weight loss Recent hospitalization for iron deficiency anemia requiring transfusion, EGD showed esophagitis, gastric ulcer. Denies hematochezia or melena. H down to 9s from 11s few months ago. 2018 colonoscopy relatively unremarkable. Etoh likely contributory. Reports several months of 15 pound wt loss - trend hgb; cytopenia w/u as below - f/u psa - likely outpt heme f/u - f/u fecal occult blood  # Hypokalemia # Hypomagnesemia Likely 2/2 diuretics, decreased intake from etoh. k 3.4, mg 1.2 - mg 2 g - k 40 - trend  # Pancytopenia Wbc 2.6, hgb 7.9, plts 126, likely 2/2 anemia. Is on allopurinol. Hcv/hiv neg last year - f/u retics, smear, b12/folate, hiv, hep c - consider holding allopurinol  # Atopic dermatitis - home triamcinolone - add loratadine for itch  # Acute on chronic kidney injury Most likely secondary to overdiuresis. Patient noted to have a bump in his creatinine from 1.58 >> 2.37. improved to 2 this AM - holding spiro and entresto - monitor  # Nicotine dependence  Smoking cessation has been discussed with patient in detail. Declines a nicotine transdermal patch at this time  # Coronary artery disease - Continue aspirin and statins  # Gout Says frequent flares w/o allopurinol - cont allop  DVT prophylaxis: lovenox Code Status: full Family  Communication: none @ bedside  Level of care: Progressive Cardiac Status is: inpt  The patient will require care spanning > 2 midnights and should be moved to inpatient because: Inpatient level of care appropriate due to severity of illness  Dispo: The patient is from: Home               Anticipated d/c is to: Home              Patient currently is not medically stable to d/c.   Difficult to place patient No        Consultants:  cardiology  Procedures: none  Antimicrobials:  none    Subjective: This morning says feeling much better. No dizziness. Has appetite. No chest pain. No n/v/d. Is mildly tremulous. Last drink 2 days ago  Objective: Vitals:   05/26/20 2102 05/27/20 0225 05/27/20 0540 05/27/20 0746  BP:  112/69 104/70 107/73  Pulse: 92 100 92 93  Resp: '16 19 16 18  '$ Temp: 99.5 F (37.5 C) 99 F (37.2 C) 98.8 F (37.1 C) 98 F (36.7 C)  TempSrc: Oral Oral Oral Oral  SpO2: 96% 99% 98% 99%  Weight:   76.2 kg     Intake/Output Summary (Last 24 hours) at 05/27/2020 0924 Last data filed at 05/27/2020 0454 Gross per 24 hour  Intake 720 ml  Output --  Net 720 ml   Filed Weights   05/27/20 0540  Weight: 76.2 kg    Examination:  General exam: Appears calm and comfortable  Respiratory system: Clear to auscultation. Respiratory effort normal. Cardiovascular system: S1 & S2 heard, RRR. Soft systolic murmur. No pedal edema. Gastrointestinal system: Abdomen is nondistended, soft and nontender. No organomegaly or masses felt. Normal bowel sounds heard. Central nervous system: Alert and oriented. No focal neurological deficits. Extremities: Symmetric 5 x 5 power. Skin: No rashes, lesions or ulcers Psychiatry: Judgement and insight appear normal. Mood & affect appropriate.     Data Reviewed: I have personally reviewed following labs and imaging studies  CBC: Recent Labs  Lab 05/26/20 1119 05/26/20 1715 05/27/20 0632  WBC 4.1 3.1* 2.6*  NEUTROABS 2.3  --   --   HGB 9.8* 9.0* 7.9*  HCT 28.9* 27.3* 24.2*  MCV 99.7 101.9* 102.5*  PLT 162 142* 123XX123*   Basic Metabolic Panel: Recent Labs  Lab 05/26/20 1119 05/26/20 1715 05/27/20 0632  NA 140 141 141  K 3.8 3.7 3.4*  CL 101 105 106  CO2 '24 25 25  '$ GLUCOSE 158* 122* 120*  BUN '19 18 17   '$ CREATININE 2.37* 2.01* 1.75*  CALCIUM 8.8* 8.3* 7.8*  MG  --  1.2*  --   PHOS  --  3.2  --    GFR: Estimated Creatinine Clearance: 46 mL/min (A) (by C-G formula based on SCr of 1.75 mg/dL (H)). Liver Function Tests: Recent Labs  Lab 05/26/20 1119 05/26/20 1715  AST 82* 82*  ALT 40 39  ALKPHOS 63 61  BILITOT 1.4* 1.3*  PROT 6.4* 6.0*  ALBUMIN 3.2* 3.0*   No results for input(s): LIPASE, AMYLASE in the last 168 hours. No results for input(s): AMMONIA in the last 168 hours. Coagulation Profile: No results for input(s): INR, PROTIME in the last 168 hours. Cardiac Enzymes: No results for input(s): CKTOTAL, CKMB, CKMBINDEX, TROPONINI in the last 168 hours. BNP (last 3 results) No results for input(s): PROBNP in the last 8760 hours. HbA1C: No results for input(s): HGBA1C in the last 72 hours. CBG: No results  for input(s): GLUCAP in the last 168 hours. Lipid Profile: No results for input(s): CHOL, HDL, LDLCALC, TRIG, CHOLHDL, LDLDIRECT in the last 72 hours. Thyroid Function Tests: No results for input(s): TSH, T4TOTAL, FREET4, T3FREE, THYROIDAB in the last 72 hours. Anemia Panel: No results for input(s): VITAMINB12, FOLATE, FERRITIN, TIBC, IRON, RETICCTPCT in the last 72 hours. Urine analysis:    Component Value Date/Time   COLORURINE YELLOW 11/20/2019 1600   APPEARANCEUR HAZY (A) 11/20/2019 1600   APPEARANCEUR Cloudy 11/19/2012 1107   LABSPEC 1.036 (H) 11/20/2019 1600   LABSPEC 1.024 11/19/2012 1107   PHURINE 5.0 11/20/2019 1600   GLUCOSEU NEGATIVE 11/20/2019 1600   GLUCOSEU Negative 11/19/2012 1107   HGBUR SMALL (A) 11/20/2019 1600   BILIRUBINUR NEGATIVE 11/20/2019 1600   BILIRUBINUR Negative 11/19/2012 1107   KETONESUR NEGATIVE 11/20/2019 1600   PROTEINUR NEGATIVE 11/20/2019 1600   UROBILINOGEN 0.2 09/14/2013 1225   NITRITE NEGATIVE 11/20/2019 1600   LEUKOCYTESUR TRACE (A) 11/20/2019 1600   LEUKOCYTESUR Negative 11/19/2012 1107   Sepsis  Labs: '@LABRCNTIP'$ (procalcitonin:4,lacticidven:4)  ) Recent Results (from the past 240 hour(s))  SARS CORONAVIRUS 2 (TAT 6-24 HRS) Nasopharyngeal Nasopharyngeal Swab     Status: None   Collection Time: 05/26/20 11:21 AM   Specimen: Nasopharyngeal Swab  Result Value Ref Range Status   SARS Coronavirus 2 NEGATIVE NEGATIVE Final    Comment: (NOTE) SARS-CoV-2 target nucleic acids are NOT DETECTED.  The SARS-CoV-2 RNA is generally detectable in upper and lower respiratory specimens during the acute phase of infection. Negative results do not preclude SARS-CoV-2 infection, do not rule out co-infections with other pathogens, and should not be used as the sole basis for treatment or other patient management decisions. Negative results must be combined with clinical observations, patient history, and epidemiological information. The expected result is Negative.  Fact Sheet for Patients: SugarRoll.be  Fact Sheet for Healthcare Providers: https://www.woods-mathews.com/  This test is not yet approved or cleared by the [place] FDA and  has been authorized for detection and/or diagnosis of SARS-CoV-2 by FDA under an Emergency Use Authorization (EUA). This EUA will remain  in effect (meaning this test can be used) for the duration of the COVID-19 declaration under Se ction 564(b)(1) of the Act, 21 U.S.C. section 360bbb-3(b)(1), unless the authorization is terminated or revoked sooner.  Performed at [facility], [place] [address] [place], [place] [address]          Radiology Studies: DG Chest Portable 1 View  Result Date: 05/26/2020 CLINICAL DATA:  Chest pain EXAM: PORTABLE CHEST 1 VIEW COMPARISON:  11/20/2019 FINDINGS: Cardiac shadow is within normal limits. The lungs are well aerated bilaterally. No focal infiltrate or effusion is seen. No acute bony abnormality is noted. Right shoulder replacement is seen. IMPRESSION: No active  disease. Electronically Signed   By: Inez Catalina M.D.   On: 05/26/2020 11:50        Scheduled Meds: . allopurinol  100 mg Oral BID  . aspirin EC  162 mg Oral Daily  . atorvastatin  20 mg Oral Daily  . enoxaparin (LOVENOX) injection  40 mg Subcutaneous Q24H  . fenofibrate  54 mg Oral Daily  . ferrous sulfate  325 mg Oral Q breakfast  . fluticasone  1 spray Each Nare BID  . folic acid  1 mg Oral Daily  . loratadine  10 mg Oral Daily  . multivitamin with minerals  1 tablet Oral Daily  . multivitamin with minerals  1 tablet Oral Daily  .  omega-3 acid ethyl esters  1 g Oral Daily  . pantoprazole  40 mg Oral Daily  . thiamine  100 mg Oral Daily   Or  . thiamine  100 mg Intravenous Daily  . triamcinolone   Topical BID   Continuous Infusions:   LOS: 0 days    Time spent: 3 min    Desma Maxim, MD [facility]   If 7PM-7AM, please contact night-coverage [email or web address] Password [facility] 05/27/2020, 9:24 AM

## 2020-05-27 NOTE — Progress Notes (Signed)
Initial Nutrition Assessment  DOCUMENTATION CODES:   Severe malnutrition in context of social or environmental circumstances  INTERVENTION:   Ensure Enlive po TID, each supplement provides 350 kcal and 20 grams of protein  Magic cup TID with meals, each supplement provides 290 kcal and 9 grams of protein  MVI, folic acid and thiamine po daily   Pt at high refeed risk; recommend monitor potassium, magnesium and phosphorus labs daily until stable  NUTRITION DIAGNOSIS:   Severe Malnutrition related to social / environmental circumstances (etoh abuse) as evidenced by moderate fat depletion,severe fat depletion,severe muscle depletion.  GOAL:   Patient will meet greater than or equal to 90% of their needs  MONITOR:   PO intake,Supplement acceptance,Labs,Weight trends,Skin,I & O's  REASON FOR ASSESSMENT:   Malnutrition Screening Tool    ASSESSMENT:   64 y.o. male with medical history significant for nonischemic cardiomyopathy secondary to alcohol abuse with last known LVEF of 45 to 50%, history of TIA, hypertension, anemia of chronic disease, gastric ulcer and nonobstructive coronary artery disease who is admitted with hypotension   Met with pt in room today. Pt reports poor appetite and oral intake for several weeks pta; pt reports that he just does not have a taste for food. RD suspects pt with poor appetite and oral intake at baseline r/t etoh abuse as pt with malnutrition. Pt reports that his appetite is improving in hospital; pt ate 100% of his breakfast and has ordered chicken noodle soup for lunch. Per chart, pt is down 16lbs(9%) in 3 months; this is significant weight loss. RD will add supplements and MVI to help pt meet his estimated needs. Pt is at high refeed risk.    Medications reviewed and include: allopurinol, aspirin, lovenox, ferrous sulfate, folic acid, MVI, lovaza, protonix, thiamine, Mg sulfate   Labs reviewed: K 3.4(L), creat 1.75(H) P 3.2 wnl, Mg 1.2(L)-  3/3 Wbc- 2.6(L), Hgb 7.9(L), Hct 24.2(L)  NUTRITION - FOCUSED PHYSICAL EXAM:  Flowsheet Row Most Recent Value  Orbital Region Moderate depletion  Upper Arm Region Severe depletion  Thoracic and Lumbar Region Severe depletion  Buccal Region Moderate depletion  Temple Region Severe depletion  Clavicle Bone Region Severe depletion  Clavicle and Acromion Bone Region Severe depletion  Scapular Bone Region Severe depletion  Dorsal Hand Severe depletion  Patellar Region Severe depletion  Anterior Thigh Region Severe depletion  Posterior Calf Region Severe depletion  Edema (RD Assessment) None  Hair Reviewed  Eyes Reviewed  Mouth Reviewed  Skin Reviewed  Nails Reviewed     Diet Order:   Diet Order            Diet 2 gram sodium Room service appropriate? Yes; Fluid consistency: Thin  Diet effective now                EDUCATION NEEDS:   Education needs have been addressed  Skin:  Skin Assessment: Reviewed RN Assessment  Last BM:  3/3  Height:   Ht Readings from Last 1 Encounters:  05/26/20 6' 4" (1.93 m)    Weight:   Wt Readings from Last 1 Encounters:  05/27/20 76.2 kg    Ideal Body Weight:  91.8 kg  BMI:  Body mass index is 20.44 kg/m.  Estimated Nutritional Needs:   Kcal:  2200-2500kcal/day  Protein:  110-125g/day  Fluid:  2.2-2.5L/day  Casey Campbell MS, RD, LDN Please refer to AMION for RD and/or RD on-call/weekend/after hours pager  

## 2020-05-28 DIAGNOSIS — I952 Hypotension due to drugs: Secondary | ICD-10-CM | POA: Diagnosis not present

## 2020-05-28 LAB — COMPREHENSIVE METABOLIC PANEL
ALT: 29 U/L (ref 0–44)
AST: 55 U/L — ABNORMAL HIGH (ref 15–41)
Albumin: 2.6 g/dL — ABNORMAL LOW (ref 3.5–5.0)
Alkaline Phosphatase: 46 U/L (ref 38–126)
Anion gap: 8 (ref 5–15)
BUN: 16 mg/dL (ref 8–23)
CO2: 26 mmol/L (ref 22–32)
Calcium: 8.3 mg/dL — ABNORMAL LOW (ref 8.9–10.3)
Chloride: 108 mmol/L (ref 98–111)
Creatinine, Ser: 1.57 mg/dL — ABNORMAL HIGH (ref 0.61–1.24)
GFR, Estimated: 49 mL/min — ABNORMAL LOW (ref >60)
Glucose, Bld: 105 mg/dL — ABNORMAL HIGH (ref 70–99)
Potassium: 3.9 mmol/L (ref 3.5–5.1)
Sodium: 142 mmol/L (ref 135–145)
Total Bilirubin: 1.1 mg/dL (ref 0.3–1.2)
Total Protein: 5.2 g/dL — ABNORMAL LOW (ref 6.5–8.1)

## 2020-05-28 LAB — CBC
HCT: 24.7 % — ABNORMAL LOW (ref 39.0–52.0)
Hemoglobin: 8.2 g/dL — ABNORMAL LOW (ref 13.0–17.0)
MCH: 34.5 pg — ABNORMAL HIGH (ref 26.0–34.0)
MCHC: 33.2 g/dL (ref 30.0–36.0)
MCV: 103.8 fL — ABNORMAL HIGH (ref 80.0–100.0)
Platelets: 129 10*3/uL — ABNORMAL LOW (ref 150–400)
RBC: 2.38 MIL/uL — ABNORMAL LOW (ref 4.22–5.81)
RDW: 18.1 % — ABNORMAL HIGH (ref 11.5–15.5)
WBC: 2.7 10*3/uL — ABNORMAL LOW (ref 4.0–10.5)
nRBC: 0 % (ref 0.0–0.2)

## 2020-05-28 LAB — HEPATITIS C ANTIBODY: HCV Ab: NONREACTIVE

## 2020-05-28 LAB — MAGNESIUM: Magnesium: 1.5 mg/dL — ABNORMAL LOW (ref 1.7–2.4)

## 2020-05-28 LAB — HIV ANTIBODY (ROUTINE TESTING W REFLEX): HIV Screen 4th Generation wRfx: NONREACTIVE

## 2020-05-28 MED ORDER — CARVEDILOL 3.125 MG PO TABS: 6.2500 mg | ORAL_TABLET | Freq: Two times a day (BID) | ORAL | 1 refills | Status: DC

## 2020-05-28 MED ORDER — MAGNESIUM SULFATE 2 GM/50ML IV SOLN
2.0000 g | Freq: Once | INTRAVENOUS | Status: AC
  Administered 2020-05-28: 2 g via INTRAVENOUS
  Filled 2020-05-28: qty 50

## 2020-05-28 MED ORDER — CARVEDILOL 3.125 MG PO TABS
3.1250 mg | ORAL_TABLET | Freq: Two times a day (BID) | ORAL | Status: DC
  Administered 2020-05-28: 3.125 mg via ORAL
  Filled 2020-05-28: qty 1

## 2020-05-28 MED ORDER — MAGNESIUM OXIDE 400 MG PO TABS: 400.0000 mg | ORAL_TABLET | Freq: Every day | ORAL | 1 refills | Status: DC

## 2020-05-28 NOTE — Progress Notes (Signed)
Discharge instructions explained/ Pt verbalized understanding. IV and tele removed. Will transport off unit via wheelchair when ride arrives.

## 2020-05-28 NOTE — Discharge Instructions (Signed)
Hypotension As your heart beats, it forces blood through your body. Hypotension, commonly called low blood pressure, is when the force of blood pumping through your arteries is too weak. Arteries are blood vessels that carry blood from the heart throughout the body. Depending on the cause and severity, hypotension may be harmless (benign) or may cause serious problems (be critical). When blood pressure is too low, you may not get enough blood to your brain or to the rest of your organs. This can cause weakness, light-headedness, rapid heartbeat, and fainting. What are the causes? This condition may be caused by:  Blood loss.  Loss of body fluids (dehydration).  Heart problems.  Hormone (endocrine) problems.  Pregnancy.  Severe infection.  Lack of certain nutrients.  Severe allergic reactions (anaphylaxis).  Certain medicines, such as blood pressure medicine or medicines that make the body lose excess fluids (diuretics). Sometimes, hypotension may be caused by not taking medicine as directed, such as taking too much of a certain medicine. What increases the risk? The following factors may make you more likely to develop this condition:  Age. Risk increases as you get older.  Conditions that affect the heart or the central nervous system.  Taking certain medicines, such as blood pressure medicine or diuretics.  Being pregnant. What are the signs or symptoms? Common symptoms of this condition include:  Weakness.  Light-headedness.  Dizziness.  Blurred vision.  Fatigue.  Rapid heartbeat.  Fainting, in severe cases. How is this diagnosed? This condition is diagnosed based on:  Your medical history.  Your symptoms.  Your blood pressure measurement. Your health care provider will check your blood pressure when you are: ? Lying down. ? Sitting. ? Standing. A blood pressure reading is recorded as two numbers, such as "120 over 80" (or 120/80). The first ("top")  number is called the systolic pressure. It is a measure of the pressure in your arteries as your heart beats. The second ("bottom") number is called the diastolic pressure. It is a measure of the pressure in your arteries when your heart relaxes between beats. Blood pressure is measured in a unit called mm Hg. Healthy blood pressure for most adults is 120/80. If your blood pressure is below 90/60, you may be diagnosed with hypotension. Other information or tests that may be used to diagnose hypotension include:  Your other vital signs, such as your heart rate and temperature.  Blood tests.  Tilt table test. For this test, you will be safely secured to a table that moves you from a lying position to an upright position. Your heart rhythm and blood pressure will be monitored during the test. How is this treated? Treatment for this condition may include:  Changing your diet. This may involve eating more salt (sodium) or drinking more water.  Taking medicines to raise your blood pressure.  Changing the dosage of certain medicines you are taking that might be lowering your blood pressure.  Wearing compression stockings. These stockings help to prevent blood clots and reduce swelling in your legs. In some cases, you may need to go to the hospital for:  Fluid replacement. This means you will receive fluids through an IV.  Blood replacement. This means you will receive donated blood through an IV (transfusion).  Treating an infection or heart problems, if this applies.  Monitoring. You may need to be monitored while medicines that you are taking wear off. Follow these instructions at home: Eating and drinking  Drink enough fluid to keep your urine  pale yellow.  Eat a healthy diet, and follow instructions from your health care provider about eating or drinking restrictions. A healthy diet includes: ? Fresh fruits and vegetables. ? Whole grains. ? Lean meats. ? Low-fat dairy  products.  Eat extra salt only as directed. Do not add extra salt to your diet unless your health care provider told you to do that.  Eat frequent, small meals.  Avoid standing up suddenly after eating.   Medicines  Take over-the-counter and prescription medicines only as told by your health care provider. ? Follow instructions from your health care provider about changing the dosage of your current medicines, if this applies. ? Do not stop or adjust any of your medicines on your own. General instructions  Wear compression stockings as told by your health care provider.  Get up slowly from lying down or sitting positions. This gives your blood pressure a chance to adjust.  Avoid hot showers and excessive heat as directed by your health care provider.  Return to your normal activities as told by your health care provider. Ask your health care provider what activities are safe for you.  Do not use any products that contain nicotine or tobacco, such as cigarettes, e-cigarettes, and chewing tobacco. If you need help quitting, ask your health care provider.  Keep all follow-up visits as told by your health care provider. This is important.   Contact a health care provider if you:  Vomit.  Have diarrhea.  Have a fever for more than 2-3 days.  Feel more thirsty than usual.  Feel weak and tired. Get help right away if you:  Have chest pain.  Have a fast or irregular heartbeat.  Develop numbness in any part of your body.  Cannot move your arms or your legs.  Have trouble speaking.  Become sweaty or feel light-headed.  Faint.  Feel short of breath.  Have trouble staying awake.  Feel confused. Summary  Hypotension is when the force of blood pumping through your arteries is too weak.  Hypotension may be harmless (benign) or may cause serious problems (be critical).  Treatment for this condition may include changing your diet, changing your medicines, and wearing  compression stockings.  In some cases, you may need to go to the hospital for fluid or blood replacement. This information is not intended to replace advice given to you by your health care provider. Make sure you discuss any questions you have with your health care provider. Document Revised: 09/05/2017 Document Reviewed: 09/05/2017 Elsevier Patient Education  [place].

## 2020-05-28 NOTE — Discharge Summary (Signed)
Travis [name] [id number] DOB: [birth year] DOA: 05/26/2020  PCP: [name], NP  Admit date: 05/26/2020 Discharge date: 05/28/2020  Time spent: 35 minutes  Recommendations for Outpatient Follow-up:  1. Close f/u in cardiology and heart failure clinics  2. Check of potassium and magnesium in 1-2 weeks 3. pcp f/u for chronic anemia 4. Referred to hematology for pancytopenia    Discharge Diagnoses:  Principal Problem:   Hypotension Active Problems:   Chronic systolic heart failure (HCC)   Tobacco use   Cardiomyopathy, nonischemic (HCC)   Essential hypertension   Alcohol use   CKD (chronic kidney disease), stage III (HCC)   AKI (acute kidney injury) ([place])   Protein-calorie malnutrition, severe   Discharge Condition: stable  Diet recommendation: heart healthy  Filed Weights   05/27/20 0540  Weight: 76.2 kg    History of present illness:  Travis [name] is a 65 y.o. male with medical history significant for nonischemic cardiomyopathy secondary to alcohol abuse with last known LVEF of 45 to 50%, history of TIA, hypertension, anemia of chronic disease and nonobstructive coronary artery disease who was sent to the emergency room from his cardiologist office for evaluation.  Patient had gone to see his cardiologist for routine follow-up appointment and complained of feeling dizzy and lightheaded. He also complained of discomfort in his mid back back between his shoulder blades as well as a 15 pound weight loss since December, 2021.  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  He states that his appetite is poor.  He denied having any chest pain. Patient was noted to have a blood pressure of 74/50 in his cardiologist office and so he was sent to the ER for further evaluation. He denied having any nausea, no vomiting, no palpitations, no diaphoresis, no headache, no blurred vision, no cough, no fever, no chills, no diarrhea, no  constipation, no abdominal pain, no urinary frequency, no nocturia, no dysuria, no falls. Labs show sodium 140, potassium 3.8, chloride 101, bicarb 24, glucose 158, BUN 19, creatinine 2.37 compared to baseline of 1.58, alkaline phosphatase 63, albumin 3.2, AST 82, ALT 40, total protein 6.4, WBC 9.8, hematocrit 28.9, MCV 99.7, RDW 18.1, platelet count 162 Chest x-ray reviewed by me showed no active cardiopulmonary disease Twelve-lead EKG reviewed by me shows sinus tachycardia with new T wave inversions in the inferior lateral leads    ED Course: Patient is a 65 year old male who was sent to the emergency room by his cardiologist for evaluation of symptomatic hypotension as well as pain in between both shoulder blades.  Patient had a blood pressure of 74/50 while in the office.  He received IV fluid hydration upon arrival to the ER with improvement in his blood pressure to 98 systolic.  Patient noted to have worsening of his renal function from baseline 1.58 >> 2.37 and has new EKG changes, he has T wave inversions in the inferolateral leads.  He will be referred to observation status for further evaluation.  Hospital Course:  # Symptomatic hypotension Most likely medication induced. BP and symptoms improved w/ holding home meds - resumed carvedilol at reduced dose and patient tolerated - holding home spiro and entresto pending cardiology f/u  # Etoh abuse With history withdrawal, denies hx complicated withdrawal. Today mildly tremulous. Last drink 3/2. Denies drug use. Monitored on ciwa. - importance of abstinence discussed  # Chronic systolic heart failure Secondary to nonischemic cardiomyopathy most likely  related to alcohol abuse. Last known LVEF of 45 to 50%. Euvolemic - med changes as above  # Anemia # Esophagitis, gastric ulcer # Weight loss Recent hospitalization for iron deficiency anemia requiring transfusion, EGD showed esophagitis, gastric ulcer. Denies hematochezia or  melena. H down to 9s from 11s few months ago. 2018 colonoscopy relatively unremarkable. Etoh likely contributory. Reports several months of 15 pound wt loss. b12 and folate wnl. Fecal occult blood neg - close pcp f/u  # Hypokalemia # Hypomagnesemia Likely 2/2 diuretics, decreased intake from etoh. k 3.4, mg 1.2 - start magnesium oral  # Pancytopenia Wbc 2.6, hgb 7.9, plts 126, likely 2/2 anemia. Is on allopurinol. Hcv/hiv neg last year - f/u retics, smear, b12/folate, hiv, hep c - neg - consider holding allopurinol - hematology referral  # Acute on chronic kidney injury Resolved with holding home entresto, spiro  # Nicotine dependence Smoking cessation has been discussed with patient in detail. Declines a nicotine transdermal patch at this time  # Coronary artery disease - Continue aspirin and statins  # Gout Says frequent flares w/o allopurinol - cont allop  Procedures:  none   Consultations:  cardiology  Discharge Exam: Vitals:   05/28/20 0820 05/28/20 1229  BP: 129/81 105/81  Pulse: 89 89  Resp: 16 16  Temp: 99.2 F (37.3 C) 98.9 F (37.2 C)  SpO2: 100% 100%    General exam: Appears calm and comfortable  Respiratory system: Clear to auscultation. Respiratory effort normal. Cardiovascular system: S1 & S2 heard, RRR. Soft systolic murmur. No pedal edema. Gastrointestinal system: Abdomen is nondistended, soft and nontender. No organomegaly or masses felt. Normal bowel sounds heard. Central nervous system: Alert and oriented. No focal neurological deficits. Extremities: Symmetric 5 x 5 power. Skin: No rashes, lesions or ulcers Psychiatry: Judgement and insight appear normal. Mood & affect appropriate.  Discharge Instructions   Discharge Instructions    Diet - low sodium heart healthy   Complete by: As directed    Increase activity slowly   Complete by: As directed      Allergies as of 05/28/2020   No Known Allergies     Medication List     STOP taking these medications   sacubitril-valsartan 24-26 MG Commonly known as: ENTRESTO   spironolactone 25 MG tablet Commonly known as: ALDACTONE     TAKE these medications   albuterol 108 (90 Base) MCG/ACT inhaler Commonly known as: VENTOLIN HFA Inhale 2 puffs into the lungs every 6 (six) hours as needed for wheezing or shortness of breath.   allopurinol 100 MG tablet Commonly known as: ZYLOPRIM Take 100 mg by mouth 2 (two) times daily.   aspirin EC 81 MG tablet Take 81 mg by mouth daily.   atorvastatin 20 MG tablet Commonly known as: LIPITOR Take 1 tablet (20 mg total) by mouth daily.   carvedilol 3.125 MG tablet Commonly known as: COREG Take 2 tablets (6.25 mg total) by mouth 2 (two) times daily with a meal. What changed: medication strength   cetirizine 10 MG tablet Commonly known as: ZYRTEC TAKE 1 TABLET BY MOUTH DAILY   fenofibrate 145 MG tablet Commonly known as: TRICOR TAKE 1 TABLET BY MOUTH ONCE A DAY   ferrous sulfate 325 (65 FE) MG tablet Take 325 mg by mouth daily with breakfast.   FISH OIL PO Take 1 capsule by mouth daily.   fluticasone 50 MCG/ACT nasal spray Commonly known as: Flonase Place 1 spray into both nostrils 2 (two) times daily.  multivitamin with minerals Tabs tablet Take 1 tablet by mouth daily. Men's One a Day   pantoprazole 40 MG tablet Commonly known as: Protonix Take 1 tablet (40 mg total) by mouth daily.   triamcinolone 0.1 % Commonly known as: KENALOG APPLY TO AFFECTED AREAS TWICE A DAY AS NEEDED. AVOID FACE,GROIN, OR UNDERARMS      No Known Allergies    The results of significant diagnostics from this hospitalization (including imaging, microbiology, ancillary and laboratory) are listed below for reference.    Significant Diagnostic Studies: DG Chest Portable 1 View  Result Date: 05/26/2020 CLINICAL DATA:  Chest pain EXAM: PORTABLE CHEST 1 VIEW COMPARISON:  11/20/2019 FINDINGS: Cardiac shadow is within normal  limits. The lungs are well aerated bilaterally. No focal infiltrate or effusion is seen. No acute bony abnormality is noted. Right shoulder replacement is seen. IMPRESSION: No active disease. Electronically Signed   By: Inez Catalina M.D.   On: 05/26/2020 11:50    Microbiology: Recent Results (from the past 240 hour(s))  SARS CORONAVIRUS 2 (TAT 6-24 HRS) Nasopharyngeal Nasopharyngeal Swab     Status: None   Collection Time: 05/26/20 11:21 AM   Specimen: Nasopharyngeal Swab  Result Value Ref Range Status   SARS Coronavirus 2 NEGATIVE NEGATIVE Final    Comment: (NOTE) SARS-CoV-2 target nucleic acids are NOT DETECTED.  The SARS-CoV-2 RNA is generally detectable in upper and lower respiratory specimens during the acute phase of infection. Negative results do not preclude SARS-CoV-2 infection, do not rule out co-infections with other pathogens, and should not be used as the sole basis for treatment or other patient management decisions. Negative results must be combined with clinical observations, patient history, and epidemiological information. The expected result is Negative.  Fact Sheet for Patients: SugarRoll.be  Fact Sheet for Healthcare Providers: https://www.woods-mathews.com/  This test is not yet approved or cleared by the [place] FDA and  has been authorized for detection and/or diagnosis of SARS-CoV-2 by FDA under an Emergency Use Authorization (EUA). This EUA will remain  in effect (meaning this test can be used) for the duration of the COVID-19 declaration under Se ction 564(b)(1) of the Act, 21 U.S.C. section 360bbb-3(b)(1), unless the authorization is terminated or revoked sooner.  Performed at [facility], [place] [address] [place], [place] [address]      Labs: Basic Metabolic Panel: Recent Labs  Lab 05/26/20 1119 05/26/20 1715 05/27/20 0632 05/28/20 0536  NA 140 141 141 142  K 3.8 3.7 3.4* 3.9  CL 101  105 106 108  CO2 '24 25 25 26  '$ GLUCOSE 158* 122* 120* 105*  BUN '19 18 17 16  '$ CREATININE 2.37* 2.01* 1.75* 1.57*  CALCIUM 8.8* 8.3* 7.8* 8.3*  MG  --  1.2*  --  1.5*  PHOS  --  3.2  --   --    Liver Function Tests: Recent Labs  Lab 05/26/20 1119 05/26/20 1715 05/28/20 0536  AST 82* 82* 55*  ALT 40 39 29  ALKPHOS 63 61 46  BILITOT 1.4* 1.3* 1.1  PROT 6.4* 6.0* 5.2*  ALBUMIN 3.2* 3.0* 2.6*   No results for input(s): LIPASE, AMYLASE in the last 168 hours. No results for input(s): AMMONIA in the last 168 hours. CBC: Recent Labs  Lab 05/26/20 1119 05/26/20 1715 05/27/20 0632 05/28/20 0536  WBC 4.1 3.1* 2.6* 2.7*  NEUTROABS 2.3  --   --   --   HGB 9.8* 9.0* 7.9* 8.2*  HCT 28.9* 27.3* 24.2* 24.7*  MCV 99.7 101.9* 102.5* 103.8*  PLT 162 142* 126* 129*   Cardiac Enzymes: No results for input(s): CKTOTAL, CKMB, CKMBINDEX, TROPONINI in the last 168 hours. BNP: BNP (last 3 results) Recent Labs    11/20/19 1600  BNP 365.0*    ProBNP (last 3 results) No results for input(s): PROBNP in the last 8760 hours.  CBG: No results for input(s): GLUCAP in the last 168 hours.     Signed:  Desma Maxim MD.  [facility] 05/28/2020, 12:53 PM

## 2020-05-28 NOTE — Progress Notes (Addendum)
CCMD reported 8 beat run of [facility], MD made aware. Awaiting any new orders. Will continue to monitor.    [id number]: No new orders at this time.

## 2020-05-28 NOTE — Progress Notes (Addendum)
Progress Note  Patient Name: Travis [name] Date of Encounter: 05/28/2020  Primary Cardiologist: Kathlyn Sacramento, MD  Subjective   Has ambulated some in room w/o presyncope/wkns.  No c/p, dyspnea. Eager to go home.  Inpatient Medications    Scheduled Meds: . allopurinol  100 mg Oral BID  . aspirin EC  162 mg Oral Daily  . atorvastatin  20 mg Oral Daily  . enoxaparin (LOVENOX) injection  40 mg Subcutaneous Q24H  . feeding supplement  237 mL Oral TID BM  . fenofibrate  54 mg Oral Daily  . ferrous sulfate  325 mg Oral Q breakfast  . fluticasone  1 spray Each Nare BID  . folic acid  1 mg Oral Daily  . loratadine  10 mg Oral Daily  . multivitamin with minerals  1 tablet Oral Daily  . omega-3 acid ethyl esters  1 g Oral Daily  . pantoprazole  40 mg Oral Daily  . thiamine  100 mg Oral Daily   Or  . thiamine  100 mg Intravenous Daily  . triamcinolone   Topical BID   Continuous Infusions:  PRN Meds: acetaminophen **OR** acetaminophen, albuterol, fentaNYL (SUBLIMAZE) injection, LORazepam **OR** LORazepam, ondansetron **OR** ondansetron (ZOFRAN) IV   Vital Signs    Vitals:   05/27/20 1548 05/27/20 2124 05/28/20 0456 05/28/20 0820  BP: 101/72 104/73 104/72 129/81  Pulse: 95 87 87 89  Resp: '18 18 17 16  '$ Temp: 97.8 F (36.6 C) 98.6 F (37 C) 98.6 F (37 C) 99.2 F (37.3 C)  TempSrc:  Oral Oral Oral  SpO2: 100% 99% 98% 100%  Weight:        Intake/Output Summary (Last 24 hours) at 05/28/2020 0919 Last data filed at 05/27/2020 1700 Gross per 24 hour  Intake 1490 ml  Output 0 ml  Net 1490 ml   Filed Weights   05/27/20 0540  Weight: 76.2 kg    Physical Exam   GEN: Thin, in no acute distress.  HEENT: Grossly normal.  Neck: Supple, no JVD, carotid bruits, or masses. Cardiac: RRR, no murmurs, rubs, or gallops. No clubbing, cyanosis, edema.  Radials 2+, DP/PT 2+ and equal bilaterally.  Respiratory:  Respirations regular and unlabored, clear to auscultation  bilaterally. GI: Soft, nontender, nondistended, BS + x 4. MS: no deformity or atrophy. Skin: warm and dry, no rash. Neuro:  Strength and sensation are intact. Psych: AAOx3.  Normal affect.  Labs    Chemistry Recent Labs  Lab 05/26/20 1119 05/26/20 1715 05/27/20 0632 05/28/20 0536  NA 140 141 141 142  K 3.8 3.7 3.4* 3.9  CL 101 105 106 108  CO2 '24 25 25 26  '$ GLUCOSE 158* 122* 120* 105*  BUN '19 18 17 16  '$ CREATININE 2.37* 2.01* 1.75* 1.57*  CALCIUM 8.8* 8.3* 7.8* 8.3*  PROT 6.4* 6.0*  --  5.2*  ALBUMIN 3.2* 3.0*  --  2.6*  AST 82* 82*  --  55*  ALT 40 39  --  29  ALKPHOS 63 61  --  46  BILITOT 1.4* 1.3*  --  1.1  GFRNONAA 30* 36* 43* 49*  ANIONGAP '15 11 10 8     '$ Hematology Recent Labs  Lab 05/26/20 1715 05/27/20 0632 05/28/20 0536  WBC 3.1* 2.6* 2.7*  RBC 2.68* 2.36*  2.31* 2.38*  HGB 9.0* 7.9* 8.2*  HCT 27.3* 24.2* 24.7*  MCV 101.9* 102.5* 103.8*  MCH 33.6 33.5 34.5*  MCHC 33.0 32.6 33.2  RDW 18.2* 18.0* 18.1*  PLT 142* 126* 129*    Cardiac Enzymes  Recent Labs  Lab 05/26/20 1119 05/26/20 1715  TROPONINIHS 11 12      Lipids  Lab Results  Component Value Date   CHOL 150 11/09/2019   HDL 47 11/09/2019   LDLCALC 88 11/09/2019   LDLDIRECT 69.0 12/30/2018   TRIG 77 11/09/2019   CHOLHDL 3.2 11/09/2019    HbA1c  Lab Results  Component Value Date   HGBA1C 7.3 (H) 11/09/2019    Radiology    DG Chest Portable 1 View  Result Date: 05/26/2020 CLINICAL DATA:  Chest pain EXAM: PORTABLE CHEST 1 VIEW COMPARISON:  11/20/2019 FINDINGS: Cardiac shadow is within normal limits. The lungs are well aerated bilaterally. No focal infiltrate or effusion is seen. No acute bony abnormality is noted. Right shoulder replacement is seen. IMPRESSION: No active disease. Electronically Signed   By: Inez Catalina M.D.   On: 05/26/2020 11:50    Telemetry    RSR, 8 beats NSVT - Personally Reviewed  Cardiac Studies   a. 12/2010 Echo: EF 20-25% b 08/2013 Echo: EF  45-50% c. 01/2015 Echo: EF 25-30% d. 07/2016 Echo: EF 35-40% e. 05/2019 Echo: EF 35-40%; d. 10/2019 Echo: EF 45-50%, mild LVH, mild red RV fxn, mildl dil RA, Triv MR. Mild to mod Ao Sclerosis w/o stenosis.  01/2015 Cath: LM nl, LAD 30p/m, LCX nl, RCA 10p/m, RPDA min irregs.  Patient Profile     Travis [name] is a 65 y.o. male with a history of nonischemic cardiomyopathy thought to be r/t alcohol abuse,HFrEF w/LVEF 45-50% on last echo Aug 2021, nonobstructive CAD s/p cath 2016, HTN, hyperlipidemia, chronic anemia, gastric ulcer disease, TIA, and tobacco abuse who was admitted 3/3 from our office in the setting of hypotension/AKI.  Assessment & Plan    1.  Presyncope/Hypotension:  Admitted from office 3/3 in setting of hypotension, wkns/fatigue, and AKI.  Bp soft throughout the day 3/4 off of all meds, though he felt a little stronger. BP 129 this AM.  Feeling much better. Given h/o NICM/HFrEF Travis [name] add back lower dose of carvedilol - 3.'125mg'$  BID (was prev 6.25 bid).  Travis [name] cont to avoid ARNI/MRA for now. Can look to resume as outpt.  2.  NICM/HFmrEF: EF 45-50% by echo 10/2019.  Feels well.  Euvolemic on exam.  As above, Travis [name] resume lower dose of carvedilol.  Cont to hold off on ARNI/MRA and can consider resumption in outpt setting.  Stressed importance of avoiding etoh.  3.  AKI:  Creat stable @ 1.57 this AM.  Cont to avoid ARNI/MRA.  Encourage PO intake.  4.  Chronic anemia w/ h/o GIB:  Hgb down to 7.9 on 3/4 in setting of IVF resuscitation.  Now 8.2/24.7.   H/o EGD in Sept w/o active bleed but gastric ulcer/inflammation/erosions, and esophagitis noted.  He reports compliance w/ PPI.  No recent melena.  5.  ETOH/Tob abuse:  Cessation advised.  6. Hypomagnesemia:  Mg 1.2 on 3/3.  Up to 1.5 after 2g MGSO4 yesterday.  Travis [name] give another 2g, esp in light of 8 beats of NSVT overnight.  Likely needs MagOx @ home.  7.  NSVT:  8 beats.  No symptoms.  Supp Mg.  Adding back low dose  blocker.  8.  Dispo:   If bp ok after  blocker, likely ok for d/c from our standpoint.  Travis [name] arrange for early outpt cards f/u to reassess ability to resume GDMT.  Signed, Travis Hodgkins, NP  05/28/2020, 9:19  AM    For questions or updates, please contact   Please consult [email or web address] for contact info under Cardiology/STEMI.   I have seen and examined this patient with Travis [name].  Agree with above, note added to reflect my findings.  On exam, RRR, no murmurs. Patient feeling well. Med changes as above. No further near syncope. OK for discharge and on coreg and Excell [place] restart other cardiac meds in clinic.    Travis  M. Riann Oman MD 05/28/2020 10:47 AM

## 2020-05-30 ENCOUNTER — Telehealth: Payer: Self-pay

## 2020-05-30 NOTE — Telephone Encounter (Signed)
Noted, will discuss at upcoming appt 

## 2020-05-30 NOTE — Telephone Encounter (Signed)
Transition Care Management Follow-up Telephone Call  Date of discharge and from where: 05/28/2020, [facility]   How have you been since you were released from the hospital? Patient states that he is doing fair.   Any questions or concerns? No  Items Reviewed:  Did the pt receive and understand the discharge instructions provided? Yes   Medications obtained and verified? Yes   Other? No   Any new allergies since your discharge? No   Dietary orders reviewed? Yes  Do you have support at home? Yes   Home Care and Equipment/Supplies: Were home health services ordered? not applicable If so, what is the name of the agency? N/A  Has the agency set up a time to come to the patient's home? not applicable Were any new equipment or medical supplies ordered?  No What is the name of the medical supply agency? N/A Were you able to get the supplies/equipment? not applicable Do you have any questions related to the use of the equipment or supplies? No  Functional Questionnaire: (I = Independent and D = Dependent) ADLs: I  Bathing/Dressing- I  Meal Prep- I  Eating- I  Maintaining continence- I  Transferring/Ambulation- I  Managing Meds- I  Follow up appointments reviewed:   PCP Hospital f/u appt confirmed? Yes  Scheduled to see [name], NP on 06/02/20 @ 2:15 pm.  [facility] f/u appt confirmed? Yes  Scheduled to see cardiology on 06/03/2020 @ 2:45 pm.  Are transportation arrangements needed? No   If their condition worsens, is the pt aware to call PCP or go to the Emergency Dept.? Yes  Was the patient provided with contact information for the PCP's office or ED? Yes  Was to pt encouraged to call back with questions or concerns? Yes

## 2020-06-02 ENCOUNTER — Encounter: Payer: Self-pay | Admitting: Internal Medicine

## 2020-06-02 ENCOUNTER — Other Ambulatory Visit: Payer: Self-pay

## 2020-06-02 ENCOUNTER — Ambulatory Visit (INDEPENDENT_AMBULATORY_CARE_PROVIDER_SITE_OTHER): Payer: Medicare HMO | Admitting: Internal Medicine

## 2020-06-02 VITALS — BP 106/68 | HR 105 | Temp 97.7°F | Wt 170.0 lb

## 2020-06-02 DIAGNOSIS — D509 Iron deficiency anemia, unspecified: Secondary | ICD-10-CM

## 2020-06-02 DIAGNOSIS — I952 Hypotension due to drugs: Secondary | ICD-10-CM

## 2020-06-02 DIAGNOSIS — R634 Abnormal weight loss: Secondary | ICD-10-CM | POA: Diagnosis not present

## 2020-06-02 DIAGNOSIS — N183 Chronic kidney disease, stage 3 unspecified: Secondary | ICD-10-CM

## 2020-06-02 DIAGNOSIS — N1832 Chronic kidney disease, stage 3b: Secondary | ICD-10-CM

## 2020-06-02 DIAGNOSIS — I426 Alcoholic cardiomyopathy: Secondary | ICD-10-CM | POA: Diagnosis not present

## 2020-06-02 DIAGNOSIS — N179 Acute kidney failure, unspecified: Secondary | ICD-10-CM | POA: Diagnosis not present

## 2020-06-02 DIAGNOSIS — E1122 Type 2 diabetes mellitus with diabetic chronic kidney disease: Secondary | ICD-10-CM

## 2020-06-02 LAB — CBC
HCT: 29.4 % — ABNORMAL LOW (ref 39.0–52.0)
Hemoglobin: 9.7 g/dL — ABNORMAL LOW (ref 13.0–17.0)
MCHC: 33.1 g/dL (ref 30.0–36.0)
MCV: 104.4 fl — ABNORMAL HIGH (ref 78.0–100.0)
Platelets: 222 10*3/uL (ref 150.0–400.0)
RBC: 2.82 Mil/uL — ABNORMAL LOW (ref 4.22–5.81)
RDW: 19.2 % — ABNORMAL HIGH (ref 11.5–15.5)
WBC: 4.3 10*3/uL (ref 4.0–10.5)

## 2020-06-02 LAB — COMPREHENSIVE METABOLIC PANEL
ALT: 46 U/L (ref 0–53)
AST: 72 U/L — ABNORMAL HIGH (ref 0–37)
Albumin: 3.9 g/dL (ref 3.5–5.2)
Alkaline Phosphatase: 52 U/L (ref 39–117)
BUN: 18 mg/dL (ref 6–23)
CO2: 28 mEq/L (ref 19–32)
Calcium: 10.6 mg/dL — ABNORMAL HIGH (ref 8.4–10.5)
Chloride: 104 mEq/L (ref 96–112)
Creatinine, Ser: 1.28 mg/dL (ref 0.40–1.50)
GFR: 59.2 mL/min — ABNORMAL LOW (ref >60.00)
Glucose, Bld: 110 mg/dL — ABNORMAL HIGH (ref 70–99)
Potassium: 4.7 mEq/L (ref 3.5–5.1)
Sodium: 139 mEq/L (ref 135–145)
Total Bilirubin: 0.5 mg/dL (ref 0.2–1.2)
Total Protein: 7.2 g/dL (ref 6.0–8.3)

## 2020-06-02 LAB — IBC PANEL
Iron: 82 ug/dL (ref 42–165)
Saturation Ratios: 27.8 % (ref 20.0–50.0)
Transferrin: 211 mg/dL — ABNORMAL LOW (ref 212.0–360.0)

## 2020-06-02 LAB — HEMOGLOBIN A1C: Hgb A1c MFr Bld: 6.4 % (ref 4.6–6.5)

## 2020-06-02 LAB — FERRITIN: Ferritin: >1500 ng/mL — ABNORMAL HIGH (ref 22.0–322.0)

## 2020-06-02 LAB — BRAIN NATRIURETIC PEPTIDE: Pro B Natriuretic peptide (BNP): 61 pg/mL (ref 0.0–100.0)

## 2020-06-02 MED ORDER — TRAMADOL HCL 50 MG PO TABS: 100.0000 mg | ORAL_TABLET | Freq: Three times a day (TID) | ORAL | 0 refills | Status: DC | PRN

## 2020-06-02 MED ORDER — FERROUS SULFATE 325 (65 FE) MG PO TABS
325.0000 mg | ORAL_TABLET | Freq: Every day | ORAL | 2 refills | Status: AC
Start: 2020-06-02 — End: ?

## 2020-06-02 MED ORDER — PANTOPRAZOLE SODIUM 40 MG PO TBEC: 40.0000 mg | DELAYED_RELEASE_TABLET | Freq: Every day | ORAL | 2 refills | Status: DC

## 2020-06-02 NOTE — Addendum Note (Signed)
Addended by: Lurlean Nanny on: 06/02/2020 02:41 PM   Modules accepted: Orders

## 2020-06-02 NOTE — Progress Notes (Signed)
Subjective:    Patient ID: Travis [name], male    DOB: 05/13/1955, 65 y.o.   MRN: [id number]  HPI  Pt presents to the clinic today for [facility] Follow Up. He went sent to the ER 3/5 from his cardiology office with c/o dizziness, lightheadedness, mid back pain and 15 lb weight loss in the last 3 months. His BP at cardiology was 74/50. Labs were remarkable for creatinine of 2.37. Chest xray was negative for acute findings. ECG showed sinus tach with T wave inversions in the lateral inferior leads. He received IV fluids. He was admitted for observation. He was anemic during this visit and did require blood transfusion. He is on an iron supplement and Pantoprazole. They felt his hypotension was secondary to weight loss and over medication. They held his Spironolactone, Entresto and reduced his Carvedilol upon discharge. Since discharge, he reports he is not taking any medications other than Magnesium and Zyrtec. He reports he is still feeling fatigued but overall somewhat better. He is not sleeping well due to chronic back pain. He is using Lidocaine patches but these are ineffective. His appetite has been a little better, he has been drinking boost and has gained about 3 lbs. He has noticed that his heart seems to be racing but he denies chest pain, chest tightness or shortness of breath. He has not noticed any increased swelling in his lower extremities. His bowels are moving normally. He has not noticed any blood in his stool and had a heme neg stool while inpatient. He reports he follows up with cardiology tomorrow and he sees hematology/oncology on Monday for evaluation of his anemia and weight loss.  Review of Systems      Past Medical History:  Diagnosis Date  . Alcohol abuse   . 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 05/2019 Echo: EF 35-40%; b. 10/2019 Echo: EF 45-50%.  . Allergy   . Asthma   . 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 Chronic anemia   . Clotting  disorder ([place])   . Diabetes mellitus, type 2 ([place])    pt reports his DM is gone  . GI bleed    15 years ago  . Gout   . Hemoptysis    secondary to pulmonary edema  . HFrEF (heart failure with reduced ejection fraction) ([place])    a. 12/2010 Echo: EF 20-25%; b 08/2013 Echo: EF 45-50%; c. 01/2015 Echo: EF 25-30%; d. 07/2016 Echo: EF 35-40%; e. 05/2019 Echo: EF 35-40%; d. 10/2019 Echo: EF 45-50%, mild LVH, mild red RV fxn, mildl dil RA, Triv MR. Mild to mod Ao Sclerosis w/o stenosis.  . Hyperlipidemia   . Hypertension   . Nonischemic cardiomyopathy ([place])   . Nonobstructive Coronary artery disease    a. 01/2015 Cath: LM nl, LAD 30p/m, LCX nl, RCA 10p/m, RPDA min irregs.  . Osteoarthritis   . Pneumonia   . TIA (transient ischemic attack)   . Tobacco abuse   . Vision loss    peripherial vision only left eye.Central Retinal  artery occusion     Current Outpatient Medications  Medication Sig Dispense Refill  . albuterol (VENTOLIN HFA) 108 (90 Base) MCG/ACT inhaler Inhale 2 puffs into the lungs every 6 (six) hours as needed for wheezing or shortness of breath.    . allopurinol (ZYLOPRIM) 100 MG tablet Take 100 mg by mouth 2 (two) times daily.    [name] aspirin EC 81 MG tablet Take 81 mg by  mouth daily.    [name] atorvastatin (LIPITOR) 20 MG tablet Take 1 tablet (20 mg total) by mouth daily. 90 tablet 3  . carvedilol (COREG) 3.125 MG tablet Take 2 tablets (6.25 mg total) by mouth 2 (two) times daily with a meal. 60 tablet 1  . cetirizine (ZYRTEC) 10 MG tablet TAKE 1 TABLET BY MOUTH DAILY 30 tablet 11  . fenofibrate (TRICOR) 145 MG tablet TAKE 1 TABLET BY MOUTH ONCE A DAY 90 tablet 0  . ferrous sulfate 325 (65 FE) MG tablet Take 325 mg by mouth daily with breakfast.    . fluticasone (FLONASE) 50 MCG/ACT nasal spray Place 1 spray into both nostrils 2 (two) times daily. 16 g 0  . magnesium oxide (MAG-OX) 400 MG tablet Take 1 tablet (400 mg total) by mouth daily. 30 tablet 1  . Multiple Vitamin (MULTIVITAMIN  WITH MINERALS) TABS tablet Take 1 tablet by mouth daily. Men's One a Day    . Omega-3 Fatty Acids (FISH OIL PO) Take 1 capsule by mouth daily.    . pantoprazole (PROTONIX) 40 MG tablet Take 1 tablet (40 mg total) by mouth daily. 90 tablet 0  . triamcinolone (KENALOG) 0.1 % APPLY TO AFFECTED AREAS TWICE A DAY AS NEEDED. AVOID FACE,GROIN, OR UNDERARMS 454 g 0   No current facility-administered medications for this visit.    No Known Allergies  Family History  Problem Relation Age of Onset  . Diabetes Mother   . Hypertension Mother   . Diabetes Father   . Hypertension Father   . Diabetes Sister   . Dementia Brother   . Cancer Neg Hx   . Heart disease Neg Hx   . Stroke Neg Hx   . Colon cancer Neg Hx   . Esophageal cancer Neg Hx   . Rectal cancer Neg Hx   . Stomach cancer Neg Hx     Social History   Socioeconomic History  . Marital status: Single    Spouse name: Not on file  . Number of children: 0  . Years of education: 53  . Highest education level: High school graduate  Occupational History  . Occupation: Retired  Tobacco Use  . Smoking status: Current Every Day Smoker    Packs/day: 0.50    Years: 30.00    Pack years: 15.00    Types: Cigarettes  . Smokeless tobacco: Never Used  Vaping Use  . Vaping Use: Never used  Substance and Sexual Activity  . Alcohol use: Yes    Alcohol/week: 1.0 standard drink    Types: 1 Glasses of wine per week    Comment: occasionally  . Drug use: Yes    Types: Marijuana  . Sexual activity: Yes    Birth control/protection: Condom  Other Topics Concern  . Not on file  Social History Narrative  . Not on file   Social Determinants of Health   Financial Resource Strain: Low Risk   . Difficulty of Paying Living Expenses: Not hard at all  Food Insecurity: No Food Insecurity  . Worried About Charity fundraiser in the Last Year: Never true  . Ran Out of Food in the Last Year: Never true  Transportation Needs: No Transportation Needs   . Lack of Transportation (Medical): No  . Lack of Transportation (Non-Medical): No  Physical Activity: Inactive  . Days of Exercise per Week: 0 days  . Minutes of Exercise per Session: 0 min  Stress: No Stress Concern Present  . Feeling of Stress :  Only a little  Social Connections: Moderately Integrated  . Frequency of Communication with Friends and Family: More than three times a week  . Frequency of Social Gatherings with Friends and Family: Three times a week  . Attends Religious Services: More than 4 times per year  . Active Member of Clubs or Organizations: Yes  . Attends Archivist Meetings: More than 4 times per year  . Marital Status: Never married  Intimate Partner Violence: Not At Risk  . Fear of Current or Ex-Partner: No  . Emotionally Abused: No  . Physically Abused: No  . Sexually Abused: No     Constitutional: Pt reports fatigue. Denies fever, malaise, headache or abrupt weight changes.  Respiratory: Denies difficulty breathing, shortness of breath, cough or sputum production.   Cardiovascular: Denies chest pain, chest tightness, palpitations or swelling in the hands or feet.  Gastrointestinal: Denies abdominal pain, bloating, constipation, diarrhea or blood in the stool.  GU: Denies urgency, frequency, pain with urination, burning sensation, blood in urine, odor or discharge. Musculoskeletal: Pt reports chronic back pain. Denies decrease in range of motion, difficulty with gait,  or joint swelling.  Skin: Denies redness, rashes, lesions or ulcercations.  Neurological: Denies dizziness, difficulty with memory, difficulty with speech or problems with balance and coordination.    No other specific complaints in a complete review of systems (except as listed in HPI above).  Objective:   Physical Exam  BP 106/68   Pulse (!) 105   Temp 97.7 F (36.5 C) (Temporal)   Wt 170 lb (77.1 kg)   SpO2 97%   BMI 20.69 kg/m   Wt Readings from Last 3 Encounters:   05/27/20 167 lb 14.4 oz (76.2 kg)  05/26/20 169 lb (76.7 kg)  04/14/20 174 lb (78.9 kg)    General: Appears his stated age, in NAD. Neck:  Neck supple, trachea midline. No masses, lumps or thyromegaly present.  Cardiovascular: Normal rate and rhythm. S1,S2 noted.  No murmur, rubs or gallops noted. No JVD or BLE edema.  Pulmonary/Chest: Normal effort and positive vesicular breath sounds. No respiratory distress. No wheezes, rales or ronchi noted.  Abdomen: Soft and nontender. Normal bowel sounds. No distention or masses noted.  Musculoskeletal: Tenderness to palpation over the lumbar spine. Gait slow and steady without device. Neurological: Alert and oriented.    BMET    Component Value Date/Time   NA 142 05/28/2020 0536   NA 142 01/13/2020 1410   NA 141 04/20/2014 1019   K 3.9 05/28/2020 0536   K 3.7 04/20/2014 1019   CL 108 05/28/2020 0536   CL 106 04/20/2014 1019   CO2 26 05/28/2020 0536   CO2 30 04/20/2014 1019   GLUCOSE 105 (H) 05/28/2020 0536   GLUCOSE 102 (H) 04/20/2014 1019   BUN 16 05/28/2020 0536   BUN 18 01/13/2020 1410   BUN 14 04/20/2014 1019   CREATININE 1.57 (H) 05/28/2020 0536   CREATININE 1.18 04/20/2014 1019   CALCIUM 8.3 (L) 05/28/2020 0536   CALCIUM 9.3 04/20/2014 1019   GFRNONAA 49 (L) 05/28/2020 0536   GFRNONAA >60 04/20/2014 1019   GFRNONAA >60 11/19/2012 1107   GFRAA 53 (L) 01/13/2020 1410   GFRAA >60 04/20/2014 1019   GFRAA >60 11/19/2012 1107    Lipid Panel     Component Value Date/Time   CHOL 150 11/09/2019 0911   CHOL 138 06/18/2017 1425   TRIG 77 11/09/2019 0911   HDL 47 11/09/2019 0911   HDL 67  06/18/2017 1425   CHOLHDL 3.2 11/09/2019 0911   VLDL 15 11/09/2019 0911   LDLCALC 88 11/09/2019 0911   LDLCALC 51 06/18/2017 1425    CBC    Component Value Date/Time   WBC 2.7 (L) 05/28/2020 0536   RBC 2.38 (L) 05/28/2020 0536   HGB 8.2 (L) 05/28/2020 0536   HGB 11.3 (L) 01/13/2020 1410   HCT 24.7 (L) 05/28/2020 0536   HCT 33.7 (L)  01/13/2020 1410   PLT 129 (L) 05/28/2020 0536   PLT 250 01/13/2020 1410   MCV 103.8 (H) 05/28/2020 0536   MCV 98 (H) 01/13/2020 1410   MCV 90 04/20/2014 1019   MCH 34.5 (H) 05/28/2020 0536   MCHC 33.2 05/28/2020 0536   RDW 18.1 (H) 05/28/2020 0536   RDW 15.1 01/13/2020 1410   RDW 16.2 (H) 04/20/2014 1019   LYMPHSABS 0.8 05/26/2020 1119   LYMPHSABS 1.0 01/13/2020 1410   LYMPHSABS 2.1 04/20/2014 1019   MONOABS 0.4 05/26/2020 1119   MONOABS 0.5 04/20/2014 1019   EOSABS 0.6 (H) 05/26/2020 1119   EOSABS 1.2 (H) 01/13/2020 1410   EOSABS 0.3 04/20/2014 1019   BASOSABS 0.0 05/26/2020 1119   BASOSABS 0.0 01/13/2020 1410   BASOSABS 0.1 04/20/2014 1019   BASOSABS 0 06/13/2012 1515    Hgb A1C Lab Results  Component Value Date   HGBA1C 7.3 (H) 11/09/2019           Assessment & Plan:  [facility] Follow Up for Hypotension, ,Cardiomyopathy, CHF, Iron Deficiency Anemia, Unitentional Weight Loss, Acute on Chronic Kidney Disease:  Hospital notes, labs and imaging reviewed Will restart all meds EXCEPT Entresto and Spironolactone (he sees cardiology tomorrow and understands he needs to ask about these) He will keep his appt with hematology/oncology for further evaluation of anemia and weight loss CBC, CMET, Ferritin, IBC panel and BNP today Will add A1C, last 7.3%-has not had follow up on this  Will follow up after labs, return precautions discussed [name], NP This visit occurred during the SARS-CoV-2 public health emergency.  Safety protocols were in place, including screening questions prior to the visit, additional usage of staff PPE, and extensive cleaning of exam room while observing appropriate contact time as indicated for disinfecting solutions.

## 2020-06-02 NOTE — Patient Instructions (Signed)
Hypotension As your heart beats, it forces blood through your body. This force is called blood pressure. If you have hypotension, you have low blood pressure. When your blood pressure is too low, you may not get enough blood to your brain or other parts of your body. This may cause you to feel weak, light-headed, have a fast heartbeat, or even pass out (faint). Low blood pressure may be harmless, or it may cause serious problems. What are the causes?  Blood loss.  Not enough water in the body (dehydration).  Heart problems.  Hormone problems.  Pregnancy.  A very bad infection.  Not having enough of certain nutrients.  Very bad allergic reactions.  Certain medicines. What increases the risk?  Age. The risk increases as you get older.  Conditions that affect the heart or the brain and spinal cord (central nervous system).  Taking certain medicines.  Being pregnant. What are the signs or symptoms?  Feeling: ? Weak. ? Light-headed. ? Dizzy. ? Tired (fatigued).  Blurred vision.  Fast heartbeat.  Passing out, in very bad cases. How is this treated?  Changing your diet. This may involve eating more salt (sodium) or drinking more water.  Taking medicines to raise your blood pressure.  Changing how much you take (the dosage) of some of your medicines.  Wearing compression stockings. These stockings help to prevent blood clots and reduce swelling in your legs. In some cases, you may need to go to the hospital for:  Fluid replacement. This means you will receive fluids through an IV tube.  Blood replacement. This means you will receive donated blood through an IV tube (transfusion).  Treating an infection or heart problems, if this applies.  Monitoring. You may need to be monitored while medicines that you are taking wear off. Follow these instructions at home: Eating and drinking  Drink enough fluids to keep your pee (urine) pale yellow.  Eat a healthy diet.  Follow instructions from your doctor about what you can eat or drink. A healthy diet includes: ? Fresh fruits and vegetables. ? Whole grains. ? Low-fat (lean) meats. ? Low-fat dairy products.  Eat extra salt only as told. Do not add extra salt to your diet unless your doctor tells you to.  Eat small meals often.  Avoid standing up quickly after you eat.   Medicines  Take over-the-counter and prescription medicines only as told by your doctor. ? Follow instructions from your doctor about changing how much you take of your medicines, if this applies. ? Do not stop or change any of your medicines on your own. General instructions  Wear compression stockings as told by your doctor.  Get up slowly from lying down or sitting.  Avoid hot showers and a lot of heat as told by your doctor.  Return to your normal activities as told by your doctor. Ask what activities are safe for you.  Do not use any products that contain nicotine or tobacco, such as cigarettes, e-cigarettes, and chewing tobacco. If you need help quitting, ask your doctor.  Keep all follow-up visits as told by your doctor. This is important.   Contact a doctor if:  You throw up (vomit).  You have watery poop (diarrhea).  You have a fever for more than 2-3 days.  You feel more thirsty than normal.  You feel weak and tired. Get help right away if:  You have chest pain.  You have a fast or uneven heartbeat.  You lose feeling (have numbness)   in any part of your body.  You cannot move your arms or your legs.  You have trouble talking.  You get sweaty or feel light-headed.  You pass out.  You have trouble breathing.  You have trouble staying awake.  You feel mixed up (confused). Summary  Hypotension is also called low blood pressure. It is when the force of blood pumping through your arteries is too weak.  Hypotension may be harmless, or it may cause serious problems.  Treatment may include changing  your diet and medicines, and wearing compression stockings.  In very bad cases, you may need to go to the hospital. This information is not intended to replace advice given to you by your health care provider. Make sure you discuss any questions you have with your health care provider. Document Revised: 09/05/2017 Document Reviewed: 09/05/2017 Elsevier Patient Education  2021 [employer]  

## 2020-06-03 ENCOUNTER — Telehealth: Payer: Medicare HMO

## 2020-06-03 ENCOUNTER — Telehealth: Payer: Self-pay

## 2020-06-03 ENCOUNTER — Ambulatory Visit: Payer: Medicare HMO | Admitting: Nurse Practitioner

## 2020-06-03 ENCOUNTER — Encounter: Payer: Self-pay | Admitting: Nurse Practitioner

## 2020-06-03 VITALS — BP 110/80 | HR 85 | Ht 76.0 in | Wt 175.0 lb

## 2020-06-03 DIAGNOSIS — N179 Acute kidney failure, unspecified: Secondary | ICD-10-CM | POA: Diagnosis not present

## 2020-06-03 DIAGNOSIS — F1010 Alcohol abuse, uncomplicated: Secondary | ICD-10-CM | POA: Diagnosis not present

## 2020-06-03 DIAGNOSIS — I10 Essential (primary) hypertension: Secondary | ICD-10-CM

## 2020-06-03 DIAGNOSIS — I5020 Unspecified systolic (congestive) heart failure: Secondary | ICD-10-CM

## 2020-06-03 DIAGNOSIS — Z720 Tobacco use: Secondary | ICD-10-CM

## 2020-06-03 DIAGNOSIS — I428 Other cardiomyopathies: Secondary | ICD-10-CM | POA: Diagnosis not present

## 2020-06-03 MED ORDER — CARVEDILOL 6.25 MG PO TABS: 6.2500 mg | ORAL_TABLET | Freq: Two times a day (BID) | ORAL | 3 refills | Status: DC

## 2020-06-03 NOTE — Telephone Encounter (Signed)
PA has been submitted via [email or web address] to [facility]... awaiting response

## 2020-06-03 NOTE — Progress Notes (Signed)
Office Visit    Patient Name: Travis [name] Date of Encounter: 06/03/2020  Primary Care Provider:  [name], NP Primary Cardiologist:  Kathlyn Sacramento, MD  Chief Complaint    Travis [name] is a 65 y/o male with history of HFrEF w/LVEF 45-50%, mild LVH, trivial MR on echo Aug 123XX123, alcoholic cardiomyopathy, chronic anemia, HTN, hyperlipidemia, nonobstructive CAD, T2DM, TIA, tobacco and alcohol abuse who presents today for hospital follow-up of his pre-syncope/hypotension, NICM/HFrEF, AKI.   Past Medical History    Past Medical History:  Diagnosis Date  . Alcohol abuse   . Alcoholic cardiomyopathy ([place]) 03/15/2013   a. 05/2019 Echo: EF 35-40%; b. 10/2019 Echo: EF 45-50%.  . Allergy   . Asthma   . 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 Chronic anemia   . Clotting disorder ([place])   . Diabetes mellitus, type 2 ([place])    pt reports his DM is gone  . GI bleed    15 years ago  . Gout   . Hemoptysis    secondary to pulmonary edema  . HFrEF (heart failure with reduced ejection fraction) ([place])    a. 12/2010 Echo: EF 20-25%; b 08/2013 Echo: EF 45-50%; c. 01/2015 Echo: EF 25-30%; d. 07/2016 Echo: EF 35-40%; e. 05/2019 Echo: EF 35-40%; d. 10/2019 Echo: EF 45-50%, mild LVH, mild red RV fxn, mildl dil RA, Triv MR. Mild to mod Ao Sclerosis w/o stenosis.  . Hyperlipidemia   . Hypertension   . Nonischemic cardiomyopathy ([place])   . Nonobstructive Coronary artery disease    a. 01/2015 Cath: LM nl, LAD 30p/m, LCX nl, RCA 10p/m, RPDA min irregs.  . Osteoarthritis   . Pneumonia   . TIA (transient ischemic attack)   . Tobacco abuse   . Vision loss    peripherial vision only left eye.Central Retinal  artery occusion    Past Surgical History:  Procedure Laterality Date  . BIOPSY  11/25/2019   Procedure: BIOPSY;  Surgeon: Ladene Artist, MD;  Location: [facility];  Service: Endoscopy;;  . CARDIAC CATHETERIZATION    . CARDIAC CATHETERIZATION N/A 01/28/2015   Procedure: Left  Heart Cath and Coronary Angiography;  Surgeon: Wellington Hampshire, MD;  Location: [facility];  Service: Cardiovascular;  Laterality: N/A;  . COLONOSCOPY    . ESOPHAGOGASTRODUODENOSCOPY (EGD) WITH PROPOFOL N/A 11/25/2019   Procedure: ESOPHAGOGASTRODUODENOSCOPY (EGD) WITH PROPOFOL;  Surgeon: Ladene Artist, MD;  Location: [facility] ENDOSCOPY;  Service: Endoscopy;  Laterality: N/A;  . HIP ARTHROPLASTY Right 03/15/2013   Procedure: ARTHROPLASTY BIPOLAR HIP;  Surgeon: [name], MD;  Location: [place];  Service: Orthopedics;  Laterality: Right;  . TOTAL SHOULDER ARTHROPLASTY Right 09/01/2015   Procedure: RIGHT TOTAL SHOULDER ARTHROPLASTY;  Surgeon: [name], MD;  Location: [place];  Service: Orthopedics;  Laterality: Right;    Allergies  No Known Allergies  History of Present Illness    Pleasant 65 y/o male with history of HFrEF w/LVEF 45-50%, mild LVH, trivial MR on echo Aug 123XX123, alcoholic cardiomyopathy, chronic anemia, HTN, hyperlipidemia, nonobstructive CAD, T2DM, TIA, gastric ulcer disease, GI bleed, tobacco and alcohol abuse who presents today for hospital follow-up of his pre-syncope/hypotension, NICM/HFrEF, AKI.  His cardiac history dates back to at least 2013 when he was admitted with acute systolic HF. More recently, echo 01/2015 showed EF 25-30%, a decrease from previous echo in 2015 w/EF 45-50%. Most recent echo 10/2019 shows EF 45-50%. He had cardiac cath Nov 2016 which  showed 10% stenosis in Prox RCA to Mid RCA and 30% stenosis in Prox LAD to Mid LAD which is not much different from previous angiography. Cardiomyopathy felt to be due to previous alcohol use. He has had multiple admissions which included titration of medications r/t hypotension, pre-syncope. He was seen in HF clinic in Jan. 2022. He was found to be euvolemic and no medication changes were made. On March 3, he was seen by Dr. Fletcher Anon in the office and found to be hypotensive at 74/50 mmHg with c/o dizziness. He  transferred him to the ED for evaluation of symptomatic hypotension. He reports weight loss of 15 lbs since December, has poor appetite, and back pain between his shoulder blades. He had previously stopped drinking alcohol but has started back drinking. In the ED, he received IV hydration with improvement in BP. His hsTroponin showed no elevation. His creatinine was increased from baseline at 2.37 2.01 1.75. His Entresto and spironolactone were held in setting of AKI.  During his hospitalization, his BP remained soft and he remained off ARNI/MRA/ blocker, also in the setting of  AKI which did improve prior to discharge. Cardiology advised that carvedilol be restarted at 3.125 mg bid and if patient tolerated, he could be discharged with close follow-up.   He returns today and reports that he has been feeling well since d/c. He brought his home medications with him and it was realized that he has been taking carvedilol 6.25 bid. His BP remains soft today. He saw PCP yesterday and was advised to discuss restarting spironolactone and entresto with [place] today. He denies chest discomfort, dyspnea, orthopnea, pre-syncope, dizziness and reports his appetite has improved. He is tolerating medications without difficulty. He has been referred to hematology for pancytopenia.   Home Medications    Prior to Admission medications   Medication Sig Start Date End Date Taking? Authorizing Provider  albuterol (VENTOLIN HFA) 108 (90 Base) MCG/ACT inhaler Inhale 2 puffs into the lungs every 6 (six) hours as needed for wheezing or shortness of breath.    [provider]  allopurinol (ZYLOPRIM) 100 MG tablet Take 100 mg by mouth 2 (two) times daily.    [provider]  aspirin EC 81 MG tablet Take 81 mg by mouth daily.    [provider]  atorvastatin (LIPITOR) 20 MG tablet Take 20 mg by mouth daily.    [provider]  carvedilol (COREG) 3.125 MG tablet Take 2 tablets (6.25 mg total) by  mouth 2 (two) times daily with a meal. 05/28/20   Wouk, Ailene Rud, MD  cetirizine (ZYRTEC) 10 MG tablet TAKE 1 TABLET BY MOUTH DAILY 07/22/19   [name], NP  fenofibrate (TRICOR) 145 MG tablet TAKE 1 TABLET BY MOUTH ONCE A DAY 05/13/20   [name], NP  ferrous sulfate 325 (65 FE) MG tablet Take 1 tablet (325 mg total) by mouth daily with breakfast. 06/02/20   [name], NP  fluticasone (FLONASE) 50 MCG/ACT nasal spray Place 1 spray into both nostrils 2 (two) times daily. 03/29/16   Cuthriell, Charline Bills, PA-C  magnesium oxide (MAG-OX) 400 MG tablet Take 1 tablet (400 mg total) by mouth daily. 05/28/20   Wouk, Ailene Rud, MD  Multiple Vitamin (MULTIVITAMIN WITH MINERALS) TABS tablet Take 1 tablet by mouth daily. Men's One a Day Patient not taking: Reported on 06/02/2020    [provider]  Omega-3 Fatty Acids (FISH OIL PO) Take 1 capsule by mouth daily. Patient not  taking: Reported on 06/02/2020    [provider]  pantoprazole (PROTONIX) 40 MG tablet Take 1 tablet (40 mg total) by mouth daily. 06/02/20   [name], NP  sacubitril-valsartan (ENTRESTO) 97-103 MG Take 1 tablet by mouth 2 (two) times daily.    [provider]  traMADol (ULTRAM) 50 MG tablet Take 2 tablets (100 mg total) by mouth every 8 (eight) hours as needed for up to 5 days. 06/02/20 06/07/20  [name], NP  triamcinolone (KENALOG) 0.1 % APPLY TO AFFECTED AREAS TWICE A DAY AS NEEDED. [place], OR UNDERARMS 05/13/20   [name], NP    Review of Systems    Denies chest discomfort, dyspnea, dizziness, pre-syncope, orthopnea, PND, palpitations, edema, n/v/d.  All other systems reviewed and are otherwise negative except as noted above.  Physical Exam    VS:  BP 110/80 (BP Location: Left Arm, Patient Position: Sitting, Cuff Size: Large)   Pulse 85   Ht '6\' 4"'$  (1.93 m)   Wt 175 lb (79.4 kg)   BMI 21.30 kg/m  GEN: Well nourished, well developed, in no acute  distress. HEENT: normal. Neck: Supple, no JVD, carotid bruits, or masses. Cardiac: RRR, no murmurs, rubs, or gallops. No clubbing, cyanosis, edema.  Radials/PT 2+ and equal bilaterally.  Respiratory:  Respirations regular and unlabored, clear to auscultation bilaterally. GI: Soft, nontender, nondistended, BS + x 4. MS: no deformity or atrophy. Skin: warm and dry, no rash. Neuro:  Strength and sensation are intact. Psych: Normal affect.  Accessory Clinical Findings    ECG personally reviewed by me today - RSR @ 86 bpm, left axis deviation, inferolateral TWI - no acute changes from previous  Lab Results  Component Value Date   WBC 4.3 06/02/2020   HGB 9.7 (L) 06/02/2020   HCT 29.4 (L) 06/02/2020   MCV 104.4 (H) 06/02/2020   PLT 222.0 06/02/2020   Lab Results  Component Value Date   CREATININE 1.28 06/02/2020   BUN 18 06/02/2020   NA 139 06/02/2020   K 4.7 06/02/2020   CL 104 06/02/2020   CO2 28 06/02/2020   Lab Results  Component Value Date   ALT 46 06/02/2020   AST 72 (H) 06/02/2020   ALKPHOS 52 06/02/2020   BILITOT 0.5 06/02/2020   Lab Results  Component Value Date   CHOL 150 11/09/2019   HDL 47 11/09/2019   LDLCALC 88 11/09/2019   LDLDIRECT 69.0 12/30/2018   TRIG 77 11/09/2019   CHOLHDL 3.2 11/09/2019    Lab Results  Component Value Date   HGBA1C 6.4 06/02/2020    Assessment & Plan    1.  Hypotension: Recent admission in the setting of volume depletion/hypotension.  BP is 110/80 today. He does not monitor at home. He remains off spironolactone, Entresto. Was advised to restart carvedilol, but instead of restarting at 3.125 mg bid as suggested by cards, he is taking 6.25 mg bid. He reports that he has stopped drinking alcohol, last drink 3/2. Reports improved appetite and intake. Would like to reinitiate GDMT for NICM/HFrEF but will hold off on adding additional medication today given borderline-soft BP. Would like to see him back in 2 weeks when he has had  additional time to recover and at that point consider adding one of these agents, preferably lowest dose Entresto.   2. Nonischemic cardiomyopathy/HFrEF w/LVEF 45-50% on last echo Aug. 2021. He resumed carvedilol 6.25 mg bid. He appears euvolemic today. Denies edema, dyspnea, chest discomfort, syncope.  He is resuming his activities at home. He is off alcohol, last drink 3/2. He continues to smoke 1 ppd. Advised that complete cessation is advised. Will see him back in 2 weeks to hopefully have improvement in BP and add back ARNI or just ARB and/or MRA.   3. AKI: Creatinine 1.28 at PCP yesterday. He remains off spironolactone and Entresto. Reports good oral intake of water. Will continue to closely monitor kidney function with consideration of adding additional medications.   4. Chronic anemia/h/o GI bleed: Hgb 9.7 yesterday (up from 8.2 @ d/c). H/o EGD Sept 2021 w/o active bleed but gastric ulcer/inflammation/erosions, and esophagitis noted. He reports compliance with Protonix, takes ferrous sulfate for iron deficiency anemia. Labs by PCP yesterday. Denies melena, n/v/d. He is scheduled to see hematologist next week for pancytopenia.   6. Substance abuse disorder: He reports that his last drink was 3/2 and that he does not want to drink anymore because it really scared him to be so hypotensive. He continues to smoke 1 ppd. Complete cessation advised. He is not interested in pharmacologic aids at this time.   7. Disposition: Will continue current medical therapy due to recent discharge and continued soft BP. Return in 2 weeks to re-eval BP and consider adding additional GDMT for HFrEF/nonischemic cardiomyopathy.   Murray Hodgkins, NP 06/03/2020, 9:47 AM

## 2020-06-03 NOTE — Patient Instructions (Addendum)
Medication Instructions:  Your physician has recommended you make the following change in your medication:   1. DECREASE Carvedilol 6.25 mg twice a day   *If you need a refill on your cardiac medications before your next appointment, please call your pharmacy*   Lab Work: None  If you have labs (blood work) drawn today and your tests are completely normal, you will receive your results only by: [name] MyChart Message (if you have MyChart) OR . A paper copy in the mail If you have any lab test that is abnormal or we need to change your treatment, we will call you to review the results.   Testing/Procedures: None   Follow-Up: At [facility], you and your health needs are our priority.  As part of our continuing mission to provide you with exceptional heart care, we have created designated Provider Care Teams.  These Care Teams include your primary Cardiologist (physician) and Advanced Practice Providers (APPs -  Physician Assistants and Nurse Practitioners) who all work together to provide you with the care you need, when you need it.   Your next appointment:   March 23rd at 2:00 PM  The format for your next appointment:   In Person  Provider:   Murray Hodgkins, NP

## 2020-06-03 NOTE — Telephone Encounter (Signed)
  Chronic Care Management   Outreach Note  06/03/2020 Name: Travis [name] MRN: [id number] DOB: 22-Aug-1955  Referred by: [name], NP Reason for referral : Chronic Care Management (Telephone follow up)   An unsuccessful telephone outreach was attempted today. The patient was referred to the case management team for assistance with care management and care coordination.   Follow Up Plan: The patient has been provided with contact information for the care management team and has been advised to call with any health related questions or concerns.  The care management team will reach out to the patient again over the next 1 week.   [name] RN,BSN,CCM RN Case Manager [place]  [phone]

## 2020-06-06 ENCOUNTER — Ambulatory Visit (INDEPENDENT_AMBULATORY_CARE_PROVIDER_SITE_OTHER): Payer: Medicare HMO

## 2020-06-06 ENCOUNTER — Inpatient Hospital Stay: Payer: Medicare HMO | Attending: Oncology | Admitting: Oncology

## 2020-06-06 ENCOUNTER — Other Ambulatory Visit: Payer: Self-pay

## 2020-06-06 ENCOUNTER — Inpatient Hospital Stay: Payer: Medicare HMO

## 2020-06-06 ENCOUNTER — Encounter: Payer: Self-pay | Admitting: Oncology

## 2020-06-06 VITALS — BP 116/73 | HR 90 | Temp 96.7°F | Wt 175.0 lb

## 2020-06-06 DIAGNOSIS — N1832 Chronic kidney disease, stage 3b: Secondary | ICD-10-CM

## 2020-06-06 DIAGNOSIS — D539 Nutritional anemia, unspecified: Secondary | ICD-10-CM

## 2020-06-06 DIAGNOSIS — Z79899 Other long term (current) drug therapy: Secondary | ICD-10-CM | POA: Diagnosis not present

## 2020-06-06 DIAGNOSIS — Z7982 Long term (current) use of aspirin: Secondary | ICD-10-CM | POA: Diagnosis not present

## 2020-06-06 DIAGNOSIS — N183 Chronic kidney disease, stage 3 unspecified: Secondary | ICD-10-CM | POA: Diagnosis not present

## 2020-06-06 DIAGNOSIS — I429 Cardiomyopathy, unspecified: Secondary | ICD-10-CM | POA: Diagnosis not present

## 2020-06-06 DIAGNOSIS — Z8673 Personal history of transient ischemic attack (TIA), and cerebral infarction without residual deficits: Secondary | ICD-10-CM | POA: Diagnosis not present

## 2020-06-06 DIAGNOSIS — D61818 Other pancytopenia: Secondary | ICD-10-CM | POA: Insufficient documentation

## 2020-06-06 DIAGNOSIS — J449 Chronic obstructive pulmonary disease, unspecified: Secondary | ICD-10-CM | POA: Diagnosis not present

## 2020-06-06 DIAGNOSIS — R531 Weakness: Secondary | ICD-10-CM | POA: Diagnosis not present

## 2020-06-06 DIAGNOSIS — I5022 Chronic systolic (congestive) heart failure: Secondary | ICD-10-CM

## 2020-06-06 DIAGNOSIS — F17210 Nicotine dependence, cigarettes, uncomplicated: Secondary | ICD-10-CM | POA: Insufficient documentation

## 2020-06-06 DIAGNOSIS — R5383 Other fatigue: Secondary | ICD-10-CM | POA: Diagnosis not present

## 2020-06-06 DIAGNOSIS — E1122 Type 2 diabetes mellitus with diabetic chronic kidney disease: Secondary | ICD-10-CM | POA: Insufficient documentation

## 2020-06-06 DIAGNOSIS — I426 Alcoholic cardiomyopathy: Secondary | ICD-10-CM

## 2020-06-06 DIAGNOSIS — I130 Hypertensive heart and chronic kidney disease with heart failure and stage 1 through stage 4 chronic kidney disease, or unspecified chronic kidney disease: Secondary | ICD-10-CM | POA: Insufficient documentation

## 2020-06-06 DIAGNOSIS — E43 Unspecified severe protein-calorie malnutrition: Secondary | ICD-10-CM | POA: Insufficient documentation

## 2020-06-06 DIAGNOSIS — E785 Hyperlipidemia, unspecified: Secondary | ICD-10-CM | POA: Insufficient documentation

## 2020-06-06 DIAGNOSIS — Z8711 Personal history of peptic ulcer disease: Secondary | ICD-10-CM | POA: Diagnosis not present

## 2020-06-06 DIAGNOSIS — M1990 Unspecified osteoarthritis, unspecified site: Secondary | ICD-10-CM | POA: Insufficient documentation

## 2020-06-06 DIAGNOSIS — D509 Iron deficiency anemia, unspecified: Secondary | ICD-10-CM

## 2020-06-06 LAB — CBC WITH DIFFERENTIAL/PLATELET
Abs Immature Granulocytes: 0.02 10*3/uL (ref 0.00–0.07)
Basophils Absolute: 0 10*3/uL (ref 0.0–0.1)
Basophils Relative: 1 %
Eosinophils Absolute: 0.4 10*3/uL (ref 0.0–0.5)
Eosinophils Relative: 9 %
HCT: 27.1 % — ABNORMAL LOW (ref 39.0–52.0)
Hemoglobin: 8.4 g/dL — ABNORMAL LOW (ref 13.0–17.0)
Immature Granulocytes: 1 %
Lymphocytes Relative: 21 %
Lymphs Abs: 0.9 10*3/uL (ref 0.7–4.0)
MCH: 33.9 pg (ref 26.0–34.0)
MCHC: 31 g/dL (ref 30.0–36.0)
MCV: 109.3 fL — ABNORMAL HIGH (ref 80.0–100.0)
Monocytes Absolute: 0.5 10*3/uL (ref 0.1–1.0)
Monocytes Relative: 11 %
Neutro Abs: 2.6 10*3/uL (ref 1.7–7.7)
Neutrophils Relative %: 57 %
Platelets: 285 10*3/uL (ref 150–400)
RBC: 2.48 MIL/uL — ABNORMAL LOW (ref 4.22–5.81)
RDW: 18.5 % — ABNORMAL HIGH (ref 11.5–15.5)
WBC: 4.4 10*3/uL (ref 4.0–10.5)
nRBC: 0 % (ref 0.0–0.2)

## 2020-06-06 LAB — COMPREHENSIVE METABOLIC PANEL
ALT: 40 U/L (ref 0–44)
AST: 39 U/L (ref 15–41)
Albumin: 3.6 g/dL (ref 3.5–5.0)
Alkaline Phosphatase: 46 U/L (ref 38–126)
Anion gap: 9 (ref 5–15)
BUN: 19 mg/dL (ref 8–23)
CO2: 26 mmol/L (ref 22–32)
Calcium: 9.6 mg/dL (ref 8.9–10.3)
Chloride: 103 mmol/L (ref 98–111)
Creatinine, Ser: 1.28 mg/dL — ABNORMAL HIGH (ref 0.61–1.24)
GFR, Estimated: >60 mL/min (ref >60)
Glucose, Bld: 120 mg/dL — ABNORMAL HIGH (ref 70–99)
Potassium: 4.7 mmol/L (ref 3.5–5.1)
Sodium: 138 mmol/L (ref 135–145)
Total Bilirubin: 0.6 mg/dL (ref 0.3–1.2)
Total Protein: 7.2 g/dL (ref 6.5–8.1)

## 2020-06-06 NOTE — Chronic Care Management (AMB) (Signed)
Care Management    RN Visit Note  06/06/2020 Name: Travis [name] MRN: [id number] DOB: 18-Mar-1956  Subjective: Travis [name] is a 65 y.o. year old male who is a primary care patient of Travis Fenton, NP. The care management team was consulted for assistance with disease management and care coordination needs.    Engaged with patient by telephone for follow up visit in response to provider referral for case management and/or care coordination services.   Consent to Services:   Travis [name] was given information about Care Management services today including:  1. Care Management services includes personalized support from designated clinical staff supervised by his physician, including individualized plan of care and coordination with other care providers 2. 24/7 contact phone numbers for assistance for urgent and routine care needs. 3. The patient may stop case management services at any time by phone call to the office staff.  Patient agreed to services and consent obtained.   Assessment: Review of patient past medical history, allergies, medications, health status, including review of consultants reports, laboratory and other test data, was performed as part of comprehensive evaluation and provision of chronic care management services.   SDOH (Social Determinants of Health) assessments and interventions performed:  SDOH Interventions   Flowsheet Row Most Recent Value  SDOH Interventions   Food Insecurity Interventions Intervention Not Indicated  Housing Interventions Intervention Not Indicated  Social Connections Interventions Patient Refused  Alcohol Brief Interventions/Follow-up Alcohol Education  [RN discussed need to continue abstaining from alcohol consumption.]       Care Plan  No Known Allergies  Outpatient Encounter Medications as of 06/06/2020  Medication Sig Note  . albuterol (VENTOLIN HFA) 108 (90 Base) MCG/ACT inhaler Inhale 2 puffs into the lungs every 6 (six) hours  as needed for wheezing or shortness of breath. (Patient not taking: Reported on 06/06/2020)   . allopurinol (ZYLOPRIM) 100 MG tablet Take 200 mg by mouth daily.   [name] aspirin 325 MG tablet Take 162.5 mg by mouth daily.   [name] atorvastatin (LIPITOR) 20 MG tablet Take 20 mg by mouth daily.   . carvedilol (COREG) 6.25 MG tablet Take 1 tablet (6.25 mg total) by mouth 2 (two) times daily.   . cetirizine (ZYRTEC) 10 MG tablet TAKE 1 TABLET BY MOUTH DAILY   . fenofibrate (TRICOR) 145 MG tablet TAKE 1 TABLET BY MOUTH ONCE A DAY   . ferrous sulfate 325 (65 FE) MG tablet Take 1 tablet (325 mg total) by mouth daily with breakfast.   . fluticasone (FLONASE) 50 MCG/ACT nasal spray Place 1 spray into both nostrils 2 (two) times daily. (Patient not taking: Reported on 06/06/2020) 06/06/2020: Patient reports he uses as needed.   . hydroxypropyl methylcellulose / hypromellose (ISOPTO TEARS / GONIOVISC) 2.5 % ophthalmic solution as needed for dry eyes.   . magnesium oxide (MAG-OX) 400 MG tablet Take 1 tablet (400 mg total) by mouth daily.   . Multiple Vitamin (MULTIVITAMIN WITH MINERALS) TABS tablet Take 1 tablet by mouth daily. Men's One a Day   . Omega-3 Fatty Acids (FISH OIL PO) Take 1 capsule by mouth daily.   . pantoprazole (PROTONIX) 40 MG tablet Take 1 tablet (40 mg total) by mouth daily.   [name] triamcinolone (KENALOG) 0.1 % APPLY TO AFFECTED AREAS TWICE A DAY AS NEEDED. AVOID FACE,GROIN, OR UNDERARMS    No facility-administered encounter medications on file as of 06/06/2020.    Patient Active Problem List   Diagnosis Date Noted  .  Protein-calorie malnutrition, severe 05/27/2020  . Hypotension 05/26/2020  . AKI (acute kidney injury) ([place]) 05/26/2020  . Alcohol use 11/20/2019  . CKD (chronic kidney disease), stage III ([place]) 11/20/2019  . TIA (transient ischemic attack) 11/08/2019  . Osteoarthritis 05/09/2016  . COPD (chronic obstructive pulmonary disease) ([place]) 05/09/2016  . Essential hypertension 04/18/2015   . Literacy level of illiterate 06/04/2014  . 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 Cardiomyopathy, nonischemic ([place]) 03/15/2013  . Chronic systolic heart failure ([place]) 01/05/2011  . Tobacco use 01/05/2011  . Diabetes ([place]) 11/30/2008  . HLD (hyperlipidemia) 11/30/2008  . Gout 11/30/2008    Conditions to be addressed/monitored: CHF and anemia  Care Plan : RNCM Heart Failure ( Adult)  Updates made by Travis Karvonen, RN since 06/06/2020 12:00 AM  Problem: Symptom Exacerbation (Heart Failure)   Priority: High  Long-Range Goal: Patient will minimize heart failure exacerbation   Start Date: 04/27/2020  Expected End Date: 07/22/2020  This Visit's Progress: On track  Recent Progress: On track  Priority: High  Current Barriers:  [name] Knowledge deficit related to basic heart failure pathophysiology and self care management: Patient reports he is not weighing and recording weights daily.  . Knowledge Deficits related to heart failure medications . Literacy Barriers: patient reports his niece assists him with reading / understanding medical documentation.  Case Manager Clinical Goal(s):  [name] Patient will verbalize understanding of Heart Failure Action Plan and when to call doctor . Patient will weigh daily and record (notifying MD of 3 lb weight gain over night or 5 lb in a week) Interventions:  . Collaboration with Travis Fenton, NP regarding development and update of comprehensive plan of care as evidenced by provider attestation and co-signature . Inter-disciplinary care team collaboration (see longitudinal plan of care) . Basic overview and discussion of pathophysiology of Heart Failure reviewed  . Provided verbal education on low sodium diet . Reviewed Heart Failure Action Plan in depth. Discussed heart failure zones. Patient reports he is in the Travis [name] zone today.  . Discussed importance of daily weight and advised patient to weigh and record daily . Reviewed role of diuretics  in prevention of fluid overload and management of heart failure . - medication-adherence assessment completed . - rescue (action) plan reviewed . - self-awareness of signs/symptoms of worsening disease encouraged:  Discussed importance of taking medications as prescribed and monitoring for heart failure symptoms.  . Patient advised to scheduled COVID 19 booster:  Informed patient he can receive COVID booster at local drug stores such as [facility] or [facility].  . Smoking cessation discussed: Patient reports he is interested in pharmacologic assistance with smoking cessation. RNCM will follow up with patients primary care provider regarding plan.  Patient Goals/Self-Care Activities . Patient will: - Takes Heart Failure Medications as prescribed and pick up your refilled prescriptions from the pharmacy  Try to weigh daily and record (notifying MD of 3 lb weight gain over night or 5 lb in a week)  Review and familiarize yourself with the Heart failure Action Plan  Follow a low salt diet - eat more whole grains, fruits and vegetables, lean meats and healthy fats - follow rescue plan for symptoms flare-up - Continue to keep your follow up appointments with your doctors.  - Continue to watch for swelling in feet, ankles and legs every day - Schedule appointment for COVID 19 booster.  - Continue to work on decreasing the number of cigarettes per day.  Follow Up Plan: The patient  has been provided with contact information for the care management team and has been advised to call with any health related questions or concerns.  The care management team will reach out to the patient again over the next 30 days.     Care Plan : Anemia  Updates made by Travis Karvonen, RN since 06/06/2020 12:00 AM  Problem: Knowledge deficit related to long term health management of anemia   Priority: High  Long-Range Goal: Anemia Complications Prevented or Managed   Start Date: 06/06/2020  Expected End Date: 09/22/2020  This  Visit's Progress: On track  Priority: High  Current Barriers:  [name] Knowledge deficits related to self health management of anemia or bleeding: Per chart review, Hgb on 3/102022 was 9.7.  Patient reports he has lost 15 lbs since December 2021.  [name] Literacy barriers:  Patient reports his niece assists him with reading/ understanding medical documentation. Nurse Case Manager Clinical Goal(s):   patient will take all medications  as prescribed and will call provider for medication related questions  patient will verbalize basic understanding of anemia disease process and self health management plan. Interventions:  . Collaboration with Travis Fenton, NP regarding development and update of comprehensive plan of care as evidenced by provider attestation and co-signature . Inter-disciplinary care team collaboration (see longitudinal plan of care) . Basic overview and discussion of anemia/bleeding disorder or acute disease state . Medications reviewed . Encouraged to maintain sobriety:  Patient reports his last alcoholic drink was on 123XX123. . encouraged optimal oral intake to support fluid balance and nutrition:  Patient reports he is drinking 1-2 boost per day.  He states he feels better and his appetite has improved.  . encouraged dietary changes to increase dietary intake of iron, Vitamin 123456 and folic acid as advised/prescribed . provided education about signs and symptoms of active bleeding and advised when to call provider or 911 . Advised to keep provider appointments as advised:  Patient reports he has his initial appointment on today 06/06/2020 with hematologist.  Patient Goals/Self-Care Activities: - Eat iron rich foods such as: lean meats and fish; Jia Mohamed leafy vegetables, such as kale and spinach; Shemar Plemmons beans, eggs; dried fruits, such as dates and figs; broccoli.  - Take your iron medication as advised by your doctor. - Follow up with Hematology specialist as advised. - Avoid alcohol -  Have lab work done as directed by your doctor - Monitor for bleeding from nose, gums, in urine or stool to provider and report these symptoms to your doctor as soon as possible.   Follow Up Plan: The patient has been provided with contact information for the care management team and has been advised to call with any health related questions or concerns.  The care management team will reach out to the patient again over the next 45 days.      Plan: The patient has been provided with contact information for the care management team and has been advised to call with any health related questions or concerns.  and The care management team will reach out to the patient again over the next 45 days days.  [name] RN,BSN,CCM RN Case Manager [place]  [phone]

## 2020-06-06 NOTE — Patient Instructions (Addendum)
Visit Information:  Thank you for taking the time to speak to me today.   Goals Addressed            This Visit's Progress   . Manage anemia       Timeframe:  Long-Range Goal Priority:  High Start Date:  06/06/2020                           Expected End Date:   09/22/2020  Follow up:  07/06/2020  Patient goals:                - Eat iron rich foods such as: lean meats and fish; Jalayna Josten leafy vegetables, such as kale and spinach; Kahliya Fraleigh beans, eggs; dried fruits, such as dates and figs; broccoli.  - Take your iron medication as advised by your doctor. - Follow up with Hematology specialist as advised. - Avoid alcohol - Have lab work done as directed by your doctor - Monitor for bleeding from nose, gums, in urine or stool to provider and report these symptoms to your doctor as soon as possible.          . Track and Manage  heart failure Symptoms   On track    Timeframe:  Long-Range Goal Priority:  High Start Date: 04/27/2020                         Expected End Date:  09/22/2020                     Follow Up Date 07/06/2020   - Takes Heart Failure Medications as prescribed and pick up your refilled prescriptions from the pharmacy  Try to weigh daily and record (notifying MD of 3 lb weight gain over night or 5 lb in a week)  Review and familiarize yourself with the Heart failure Action Plan  Follow a low salt diet - eat more whole grains, fruits and vegetables, lean meats and healthy fats - follow rescue plan for symptoms flare-up - Continue to keep your follow up appointments with your doctors.  - Continue to watch for swelling in feet, ankles and legs every day - Schedule appointment for COVID 19 booster.  - Continue to work on decreasing the number of cigarettes per day.    Why is this important?   You will be able to handle your symptoms better if you keep track of them.  Making some simple changes to your lifestyle will help.  Eating healthy is one thing you can do to take  good care of yourself.    Notes:      Patient verbalizes understanding of instructions provided today and agrees to view in [place].   The patient has been provided with contact information for the care management team and has been advised to call with any health related questions or concerns.  The care management team will reach out to the patient again over the next 45 days.   [name] RN,BSN,CCM RN Case Manager [place]  [phone]

## 2020-06-06 NOTE — Addendum Note (Signed)
Addended by: Raelene Bott, Norita Meigs L on: 06/06/2020 04:11 PM   Modules accepted: Orders

## 2020-06-07 LAB — KAPPA/LAMBDA LIGHT CHAINS
Kappa free light chain: 51.5 mg/L — ABNORMAL HIGH (ref 3.3–19.4)
Kappa, lambda light chain ratio: 1.67 — ABNORMAL HIGH (ref 0.26–1.65)
Lambda free light chains: 30.8 mg/L — ABNORMAL HIGH (ref 5.7–26.3)

## 2020-06-09 LAB — MULTIPLE MYELOMA PANEL, SERUM
Albumin SerPl Elph-Mcnc: 3.4 g/dL (ref 2.9–4.4)
Albumin/Glob SerPl: 1.1 (ref 0.7–1.7)
Alpha 1: 0.3 g/dL (ref 0.0–0.4)
Alpha2 Glob SerPl Elph-Mcnc: 0.9 g/dL (ref 0.4–1.0)
B-Globulin SerPl Elph-Mcnc: 1 g/dL (ref 0.7–1.3)
Gamma Glob SerPl Elph-Mcnc: 0.9 g/dL (ref 0.4–1.8)
Globulin, Total: 3.2 g/dL (ref 2.2–3.9)
IgA: 293 mg/dL (ref 61–437)
IgG (Immunoglobin G), Serum: 899 mg/dL (ref 603–1613)
IgM (Immunoglobulin M), Srm: 62 mg/dL (ref 20–172)
Total Protein ELP: 6.6 g/dL (ref 6.0–8.5)

## 2020-06-09 NOTE — Progress Notes (Signed)
Hematology/Oncology Consult note [facility] [phone] [phone]  Patient Care Team: [name], NP as PCP - General (Internal Medicine) Wellington Hampshire, MD as PCP - Cardiology (Cardiology) Alisa Graff, FNP as Nurse Practitioner (Cardiology) Wellington Hampshire, MD as Consulting Physician (Cardiology) Dannielle Karvonen, RN as Case Manager   Name of the patient: Travis [name]  [id number]  Nov 14, 1955    Reason for referral-pancytopenia   Referring physician-Regina Baity, NP  Date of visit: 06/09/20   History of presenting illness-patient is a 65 year old male with a past medical history significant for hypertension hyperlipidemia CKD referred for pancytopenia.  He also has a history of nonischemic cardiomyopathy and central retinal artery occlusion.  Most recent CBC from 06/02/2020 showed white count of 4.3, H&H of 9.7/29.4 with an MCV of 104 and a platelet count of 22.  Looking back at his prior CBCs patient's white count has been mostly around 4 with an [place] that fluctuates between 1.3-6.  He has had intermittent thrombocytopenia in the past since 2020 when his platelet counts have fluctuated between 100s to 120s but have also been normal sometimes.  Hemoglobin likewise has varied between 7-9 since August 2021 but prior to that he was normal at 12.  Patient was hospitalized for blood loss anemia secondary to nonbleeding prepyloric gastric ulcer as well as gastritis and duodenal erosions.  Patient presently reports mild fatigue but denies other complaints at this time.  Patient was drinking alcohol up until August 2021But quit drinking after his hospitalization in August.   ECOG PS- 1  Pain scale- 0   Review of systems- Review of Systems  Constitutional: Positive for malaise/fatigue. Negative for chills, fever and weight loss.  HENT: Negative for congestion, ear discharge and nosebleeds.   Eyes: Negative for blurred vision.   Respiratory: Negative for cough, hemoptysis, sputum production, shortness of breath and wheezing.   Cardiovascular: Negative for chest pain, palpitations, orthopnea and claudication.  Gastrointestinal: Negative for abdominal pain, blood in stool, constipation, diarrhea, heartburn, melena, nausea and vomiting.  Genitourinary: Negative for dysuria, flank pain, frequency, hematuria and urgency.  Musculoskeletal: Negative for back pain, joint pain and myalgias.  Skin: Negative for rash.  Neurological: Negative for dizziness, tingling, focal weakness, seizures, weakness and headaches.  Endo/Heme/Allergies: Does not bruise/bleed easily.  Psychiatric/Behavioral: Negative for depression and suicidal ideas. The patient does not have insomnia.     No Known Allergies  Patient Active Problem List   Diagnosis Date Noted  . Protein-calorie malnutrition, severe 05/27/2020  . Hypotension 05/26/2020  . AKI (acute kidney injury) ([place]) 05/26/2020  . Alcohol use 11/20/2019  . CKD (chronic kidney disease), stage III ([place]) 11/20/2019  . TIA (transient ischemic attack) 11/08/2019  . Osteoarthritis 05/09/2016  . COPD (chronic obstructive pulmonary disease) ([place]) 05/09/2016  . Essential hypertension 04/18/2015  . Literacy level of illiterate 06/04/2014  . 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 Cardiomyopathy, nonischemic ([place]) 03/15/2013  . Chronic systolic heart failure ([place]) 01/05/2011  . Tobacco use 01/05/2011  . Diabetes ([place]) 11/30/2008  . HLD (hyperlipidemia) 11/30/2008  . Gout 11/30/2008     Past Medical History:  Diagnosis Date  . Alcohol abuse   . 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 05/2019 Echo: EF 35-40%; b. 10/2019 Echo: EF 45-50%.  . Allergy   . Asthma   . 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 Chronic anemia   . Clotting disorder ([place])   . Diabetes mellitus,  type 2 ([place])    pt reports his DM is gone  . GI bleed    15 years ago  . Gout    . Hemoptysis    secondary to pulmonary edema  . HFimpEF (heart failure with improved ejection fraction) ([place])    a. 12/2010 Echo: EF 20-25%; b 08/2013 Echo: EF 45-50%; c. 01/2015 Echo: EF 25-30%; d. 07/2016 Echo: EF 35-40%; e. 05/2019 Echo: EF 35-40%; d. 10/2019 Echo: EF 45-50%, mild LVH, mild red RV fxn, mildl dil RA, Triv MR. Mild to mod Ao Sclerosis w/o stenosis.  . Hyperlipidemia   . Hypertension   . Nonischemic cardiomyopathy ([place])   . Nonobstructive Coronary artery disease    a. 01/2015 Cath: LM nl, LAD 30p/m, LCX nl, RCA 10p/m, RPDA min irregs.  . Osteoarthritis   . Pneumonia   . TIA (transient ischemic attack)   . Tobacco abuse   . Vision loss    peripherial vision only left eye.Central Retinal  artery occusion      Past Surgical History:  Procedure Laterality Date  . BIOPSY  11/25/2019   Procedure: BIOPSY;  Surgeon: Ladene Artist, MD;  Location: [place];  Service: Endoscopy;;  . CARDIAC CATHETERIZATION    . CARDIAC CATHETERIZATION N/A 01/28/2015   Procedure: Left Heart Cath and Coronary Angiography;  Surgeon: Wellington Hampshire, MD;  Location: [place] [facility];  Service: Cardiovascular;  Laterality: N/A;  . COLONOSCOPY    . ESOPHAGOGASTRODUODENOSCOPY (EGD) WITH PROPOFOL N/A 11/25/2019   Procedure: ESOPHAGOGASTRODUODENOSCOPY (EGD) WITH PROPOFOL;  Surgeon: Ladene Artist, MD;  [facility] ENDOSCOPY;  Service: Endoscopy;  Laterality: N/A;  . HIP ARTHROPLASTY Right 03/15/2013   Procedure: ARTHROPLASTY BIPOLAR HIP;  Surgeon: [name], MD;  Location: [place];  Service: Orthopedics;  Laterality: Right;  . TOTAL SHOULDER ARTHROPLASTY Right 09/01/2015   Procedure: RIGHT TOTAL SHOULDER ARTHROPLASTY;  Surgeon: [name], MD;  Location: [place];  Service: Orthopedics;  Laterality: Right;    Social History   Socioeconomic History  . Marital status: Single    Spouse name: Not on file  . Number of children: 0  . Years of education: 75  . Highest education level:  High school graduate  Occupational History  . Occupation: Retired  Tobacco Use  . Smoking status: Current Every Day Smoker    Packs/day: 0.50    Years: 30.00    Pack years: 15.00    Types: Cigarettes  . Smokeless tobacco: Never Used  Vaping Use  . Vaping Use: Never used  Substance and Sexual Activity  . Alcohol use: Not Currently    Alcohol/week: 1.0 standard drink    Types: 1 Glasses of wine per week    Comment: occasionally  . Drug use: Yes    Types: Marijuana  . Sexual activity: Yes    Birth control/protection: Condom  Other Topics Concern  . Not on file  Social History Narrative  . Not on file   Social Determinants of Health   Financial Resource Strain: Low Risk   . Difficulty of Paying Living Expenses: Not hard at all  Food Insecurity: No Food Insecurity  . Worried About Charity fundraiser in the Last Year: Never true  . Ran Out of Food in the Last Year: Never true  Transportation Needs: No Transportation Needs  . Lack of Transportation (Medical): No  . Lack of Transportation (Non-Medical): No  Physical Activity: Inactive  . Days of Exercise per Week: 0 days  . Minutes of  Exercise per Session: 0 min  Stress: No Stress Concern Present  . Feeling of Stress : Only a little  Social Connections: Socially Isolated  . Frequency of Communication with Friends and Family: More than three times a week  . Frequency of Social Gatherings with Friends and Family: More than three times a week  . Attends Religious Services: Never  . Active Member of Clubs or Organizations: No  . Attends Archivist Meetings: Never  . Marital Status: Never married  Intimate Partner Violence: Not At Risk  . Fear of Current or Ex-Partner: No  . Emotionally Abused: No  . Physically Abused: No  . Sexually Abused: No     Family History  Problem Relation Age of Onset  . Diabetes Mother   . Hypertension Mother   . Diabetes Father   . Hypertension Father   . Diabetes Sister   .  Dementia Brother   . Cancer Neg Hx   . Heart disease Neg Hx   . Stroke Neg Hx   . Colon cancer Neg Hx   . Esophageal cancer Neg Hx   . Rectal cancer Neg Hx   . Stomach cancer Neg Hx      Current Outpatient Medications:  .  allopurinol (ZYLOPRIM) 100 MG tablet, Take 200 mg by mouth daily., Disp: , Rfl:  .  aspirin 325 MG tablet, Take 162.5 mg by mouth daily., Disp: , Rfl:  .  atorvastatin (LIPITOR) 20 MG tablet, Take 20 mg by mouth daily., Disp: , Rfl:  .  carvedilol (COREG) 6.25 MG tablet, Take 1 tablet (6.25 mg total) by mouth 2 (two) times daily., Disp: 180 tablet, Rfl: 3 .  cetirizine (ZYRTEC) 10 MG tablet, TAKE 1 TABLET BY MOUTH DAILY, Disp: 30 tablet, Rfl: 11 .  fenofibrate (TRICOR) 145 MG tablet, TAKE 1 TABLET BY MOUTH ONCE A DAY, Disp: 90 tablet, Rfl: 0 .  ferrous sulfate 325 (65 FE) MG tablet, Take 1 tablet (325 mg total) by mouth daily with breakfast., Disp: 90 tablet, Rfl: 2 .  hydroxypropyl methylcellulose / hypromellose (ISOPTO TEARS / GONIOVISC) 2.5 % ophthalmic solution, as needed for dry eyes., Disp: , Rfl:  .  magnesium oxide (MAG-OX) 400 MG tablet, Take 1 tablet (400 mg total) by mouth daily., Disp: 30 tablet, Rfl: 1 .  Multiple Vitamin (MULTIVITAMIN WITH MINERALS) TABS tablet, Take 1 tablet by mouth daily. Men's One a Day, Disp: , Rfl:  .  Omega-3 Fatty Acids (FISH OIL PO), Take 1 capsule by mouth daily., Disp: , Rfl:  .  pantoprazole (PROTONIX) 40 MG tablet, Take 1 tablet (40 mg total) by mouth daily., Disp: 90 tablet, Rfl: 2 .  triamcinolone (KENALOG) 0.1 %, APPLY TO AFFECTED AREAS TWICE A DAY AS NEEDED. AVOID FACE,GROIN, OR UNDERARMS, Disp: 454 g, Rfl: 0 .  albuterol (VENTOLIN HFA) 108 (90 Base) MCG/ACT inhaler, Inhale 2 puffs into the lungs every 6 (six) hours as needed for wheezing or shortness of breath. (Patient not taking: Reported on 06/06/2020), Disp: , Rfl:  .  fluticasone (FLONASE) 50 MCG/ACT nasal spray, Place 1 spray into both nostrils 2 (two) times daily.  (Patient not taking: Reported on 06/06/2020), Disp: 16 g, Rfl: 0   Physical exam:  Vitals:   06/06/20 1505  BP: 116/73  Pulse: 90  Temp: (!) 96.7 F (35.9 C)  TempSrc: Tympanic  SpO2: 100%  Weight: 175 lb (79.4 kg)   Physical Exam Constitutional:      General: He is not in acute  distress. Cardiovascular:     Rate and Rhythm: Normal rate and regular rhythm.     Heart sounds: Normal heart sounds.  Pulmonary:     Effort: Pulmonary effort is normal.     Breath sounds: Normal breath sounds.  Abdominal:     General: Bowel sounds are normal.     Palpations: Abdomen is soft.  Skin:    General: Skin is warm and dry.  Neurological:     Mental Status: He is alert and oriented to person, place, and time.        CMP Latest Ref Rng & Units 06/06/2020  Glucose 70 - 99 mg/dL 120(H)  BUN 8 - 23 mg/dL 19  Creatinine 0.61 - 1.24 mg/dL 1.28(H)  Sodium 135 - 145 mmol/L 138  Potassium 3.5 - 5.1 mmol/L 4.7  Chloride 98 - 111 mmol/L 103  CO2 22 - 32 mmol/L 26  Calcium 8.9 - 10.3 mg/dL 9.6  Total Protein 6.5 - 8.1 g/dL 7.2  Total Bilirubin 0.3 - 1.2 mg/dL 0.6  Alkaline Phos 38 - 126 U/L 46  AST 15 - 41 U/L 39  ALT 0 - 44 U/L 40   CBC Latest Ref Rng & Units 06/06/2020  WBC 4.0 - 10.5 K/uL 4.4  Hemoglobin 13.0 - 17.0 g/dL 8.4(L)  Hematocrit 39.0 - 52.0 % 27.1(L)  Platelets 150 - 400 K/uL 285    No images are attached to the encounter.  DG Chest Portable 1 View  Result Date: 05/26/2020 CLINICAL DATA:  Chest pain EXAM: PORTABLE CHEST 1 VIEW COMPARISON:  11/20/2019 FINDINGS: Cardiac shadow is within normal limits. The lungs are well aerated bilaterally. No focal infiltrate or effusion is seen. No acute bony abnormality is noted. Right shoulder replacement is seen. IMPRESSION: No active disease. Electronically Signed   By: Inez Catalina M.D.   On: 05/26/2020 11:50    Assessment and plan- Patient is a 65 y.o. male referred for pancytopenia  Patient has had mild leukopenia in the past but  more recently his white count has been around 4.  He has had thrombocytopenia intermittently in the past After patient was admitted in the hospit fluctuating mostly between 7-9.  Most recent hemoglobin was 9.7 with an MCV of 104.  Today I will do a further anemia work-up including checking B1 levels B6, myeloma and serum free light chains he had a recent iron studies done on 06/02/2020 which showed a ferritin of greater than 1500 and TIBC that was low at 211.  He has had some CKD in the past as well which may be contributing to his anemia as well.  TSH B12 and folate this month were normal.  A past l for GI bleeding in August 2021 his hemoglobinbut his platelet counts over the last 3 months have been normal.  I will see him back in 2 weeks to discuss the results of his blood work and further management   Thank you for this kind referral and the opportunity to participate in the care of this patient   Visit Diagnosis 1. Macrocytic anemia     Dr. Randa Evens, MD, MPH [facility] [id number] 06/09/2020  6:04 PM

## 2020-06-14 LAB — VITAMIN B1: Vitamin B1 (Thiamine): 127 nmol/L (ref 66.5–200.0)

## 2020-06-15 ENCOUNTER — Ambulatory Visit: Payer: Medicare HMO | Admitting: Nurse Practitioner

## 2020-06-15 NOTE — Progress Notes (Deleted)
Office Visit    Patient Name: Travis [name] Date of Encounter: 06/15/2020  Primary Care Provider:  [name], NP Primary Cardiologist:  Kathlyn Sacramento, MD  Chief Complaint    ***  Past Medical History    Past Medical History:  Diagnosis Date  . Alcohol abuse   . 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 05/2019 Echo: EF 35-40%; b. 10/2019 Echo: EF 45-50%.  . Allergy   . Asthma   . 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 Chronic anemia   . Clotting disorder ([place])   . Diabetes mellitus, type 2 ([place])    pt reports his DM is gone  . GI bleed    15 years ago  . Gout   . Hemoptysis    secondary to pulmonary edema  . HFimpEF (heart failure with improved ejection fraction) ([place])    a. 12/2010 Echo: EF 20-25%; b 08/2013 Echo: EF 45-50%; c. 01/2015 Echo: EF 25-30%; d. 07/2016 Echo: EF 35-40%; e. 05/2019 Echo: EF 35-40%; d. 10/2019 Echo: EF 45-50%, mild LVH, mild red RV fxn, mildl dil RA, Triv MR. Mild to mod Ao Sclerosis w/o stenosis.  . Hyperlipidemia   . Hypertension   . Nonischemic cardiomyopathy ([place])   . Nonobstructive Coronary artery disease    a. 01/2015 Cath: LM nl, LAD 30p/m, LCX nl, RCA 10p/m, RPDA min irregs.  . Osteoarthritis   . Pneumonia   . TIA (transient ischemic attack)   . Tobacco abuse   . Vision loss    peripherial vision only left eye.Central Retinal  artery occusion    Past Surgical History:  Procedure Laterality Date  . BIOPSY  11/25/2019   Procedure: BIOPSY;  Surgeon: Ladene Artist, MD;  Location: [place];  Service: Endoscopy;;  . CARDIAC CATHETERIZATION    . CARDIAC CATHETERIZATION N/A 01/28/2015   Procedure: Left Heart Cath and Coronary Angiography;  Surgeon: Wellington Hampshire, MD;  Location: [place] [facility];  Service: Cardiovascular;  Laterality: N/A;  . COLONOSCOPY    . ESOPHAGOGASTRODUODENOSCOPY (EGD) WITH PROPOFOL N/A 11/25/2019   Procedure: ESOPHAGOGASTRODUODENOSCOPY (EGD) WITH PROPOFOL;  Surgeon: Ladene Artist, MD;  Location: [facility] ENDOSCOPY;  Service: Endoscopy;  Laterality: N/A;  . HIP ARTHROPLASTY Right 03/15/2013   Procedure: ARTHROPLASTY BIPOLAR HIP;  Surgeon: [name], MD;  Location: [place];  Service: Orthopedics;  Laterality: Right;  . TOTAL SHOULDER ARTHROPLASTY Right 09/01/2015   Procedure: RIGHT TOTAL SHOULDER ARTHROPLASTY;  Surgeon: [name], MD;  Location: [place];  Service: Orthopedics;  Laterality: Right;    Allergies  No Known Allergies  History of Present Illness    ***  Home Medications    Prior to Admission medications   Medication Sig Start Date End Date Taking? Authorizing Provider  albuterol (VENTOLIN HFA) 108 (90 Base) MCG/ACT inhaler Inhale 2 puffs into the lungs every 6 (six) hours as needed for wheezing or shortness of breath. Patient not taking: Reported on 06/06/2020    [provider]  allopurinol (ZYLOPRIM) 100 MG tablet Take 200 mg by mouth daily.    [provider]  aspirin 325 MG tablet Take 162.5 mg by mouth daily.    [provider]  atorvastatin (LIPITOR) 20 MG tablet Take 20 mg by mouth daily.    [provider]  carvedilol (COREG) 6.25 MG tablet Take 1 tablet (6.25 mg total) by mouth 2 (two) times daily. 06/03/20   Theora Gianotti, NP  cetirizine Alethia Berthold)  10 MG tablet TAKE 1 TABLET BY MOUTH DAILY 07/22/19   [name], NP  fenofibrate (TRICOR) 145 MG tablet TAKE 1 TABLET BY MOUTH ONCE A DAY 05/13/20   [name], NP  ferrous sulfate 325 (65 FE) MG tablet Take 1 tablet (325 mg total) by mouth daily with breakfast. 06/02/20   [name], NP  fluticasone (FLONASE) 50 MCG/ACT nasal spray Place 1 spray into both nostrils 2 (two) times daily. Patient not taking: Reported on 06/06/2020 03/29/16   Cuthriell, Charline Bills, PA-C  hydroxypropyl methylcellulose / hypromellose (ISOPTO TEARS / GONIOVISC) 2.5 % ophthalmic solution as needed for dry eyes.    [provider]  magnesium oxide  (MAG-OX) 400 MG tablet Take 1 tablet (400 mg total) by mouth daily. 05/28/20   Wouk, Ailene Rud, MD  Multiple Vitamin (MULTIVITAMIN WITH MINERALS) TABS tablet Take 1 tablet by mouth daily. Men's One a Day    [provider]  Omega-3 Fatty Acids (FISH OIL PO) Take 1 capsule by mouth daily.    [provider]  pantoprazole (PROTONIX) 40 MG tablet Take 1 tablet (40 mg total) by mouth daily. 06/02/20   [name], NP  triamcinolone (KENALOG) 0.1 % APPLY TO AFFECTED AREAS TWICE A DAY AS NEEDED. [place] Coho, OR UNDERARMS 05/13/20   [name], NP    Review of Systems    ***.  All other systems reviewed and are otherwise negative except as noted above.  Physical Exam    VS:  There were no vitals taken for this visit. , BMI There is no height or weight on file to calculate BMI. GEN: Well nourished, well developed, in no acute distress. HEENT: normal. Neck: Supple, no JVD, carotid bruits, or masses. Cardiac: RRR, no murmurs, rubs, or gallops. No clubbing, cyanosis, edema.  Radials/DP/PT 2+ and equal bilaterally.  Respiratory:  Respirations regular and unlabored, clear to auscultation bilaterally. GI: Soft, nontender, nondistended, BS + x 4. MS: no deformity or atrophy. Skin: warm and dry, no rash. Neuro:  Strength and sensation are intact. Psych: Normal affect.  Accessory Clinical Findings    ECG personally reviewed by me today - *** - no acute changes.  Lab Results  Component Value Date   WBC 4.4 06/06/2020   HGB 8.4 (L) 06/06/2020   HCT 27.1 (L) 06/06/2020   MCV 109.3 (H) 06/06/2020   PLT 285 06/06/2020   Lab Results  Component Value Date   CREATININE 1.28 (H) 06/06/2020   BUN 19 06/06/2020   NA 138 06/06/2020   K 4.7 06/06/2020   CL 103 06/06/2020   CO2 26 06/06/2020   Lab Results  Component Value Date   ALT 40 06/06/2020   AST 39 06/06/2020   ALKPHOS 46 06/06/2020   BILITOT 0.6 06/06/2020   Lab Results  Component Value Date   CHOL 150  11/09/2019   HDL 47 11/09/2019   LDLCALC 88 11/09/2019   LDLDIRECT 69.0 12/30/2018   TRIG 77 11/09/2019   CHOLHDL 3.2 11/09/2019    Lab Results  Component Value Date   HGBA1C 6.4 06/02/2020    Assessment & Plan    1.  ***   Murray Hodgkins, NP 06/15/2020, 7:24 AM

## 2020-06-19 NOTE — Progress Notes (Signed)
Cardiology Office Note    Date:  06/20/2020   ID:  Travis [name], DOB 08/31/1955, MRN [id number]  PCP:  [name], NP  Cardiologist:  Kathlyn Sacramento, MD  Electrophysiologist:  None   Chief Complaint: Follow up  History of Present Illness:   Travis [name] is a 65 y.o. male with history of CAD, alcoholic cardiomyopathy with HFrEF with an LVEF of 45 to 50%, mild LVH, trivial mitral regurgitation by echo in 10/2019, DM2, TIA, chronic anemia with pancytopenia, HTN, HLD, gastric ulcer disease with prior GI bleed, and alcohol/tobacco use who presents for follow-up of his cardiomyopathy.  His cardiac history dates back to at least 2012 with chart indicating an echo at that time showed an EF of 20 to 25% with global hypokinesis.  Repeat echo in 2013 showed an EF of 50 to 55%.  Echo in 01/2015 showed an EF of 25 to 30% which was a decrease from prior echo in 2015 with an EF of 45 to 50%.  In 01/2015, he underwent diagnostic LHC which showed an occluded proximal RCA to mid RCA along with 30% stenosis of the proximal to mid LAD which was largely unchanged from prior angiography.  His cardiomyopathy was felt to be due to prior alcohol use.  Over the years he has had multiple hospital admissions which have included titration of medications related to hypotension and presyncope.  He was seen in the [facility] CHF clinic in 03/2020 and felt to be euvolemic with no medication changes being made.  He was seen in our office in early 05/2020 and was found to be hypotensive with BP 74/50 with complaints of dizziness.  He was sent to the ED for evaluation of symptomatic hypotension.  He reported a 15 pound weight loss since 02/2020 and had a poor appetite.  It was noted he had previously stopped drinking though had restarted more recently.  In the ED he was treated with IV fluids.  High-sensitivity troponin negative x2.  He was noted to have AKI with an initial serum creatinine 2.37 subsequently improving with  hydration.  Entresto and spironolactone were held in the setting of hypotension with AKI.  During his admission, his BP remained soft leading to the continued holding of Entresto, spironolactone, and beta-blocker.  At discharge, it was recommended carvedilol 3.125 mg twice daily be resumed.  He was seen in hospital follow-up on 06/03/2020 and reported he had been feeling well since his hospital discharge.  Upon reviewing his home medications it was noted he had been taking carvedilol 6.25 mg twice daily with continued soft BP at 110/80.  He had discontinued alcohol again and reported improved appetite.  Given borderline soft BP escalation of GDMT was deferred with recommendation to follow-up today.  He was felt to be euvolemic.  He comes in doing very well from a cardiac perspective.  No chest pain, dyspnea, palpitations, dizziness, presyncope, syncope, lower extremity swelling, abdominal distention, worsening orthopnea (baseline 2 pillow orthopnea), PND, or early satiety.  He is tolerating carvedilol 6.25 mg twice daily without issues.  He is watching his p.o. fluid and salt intake.  His weight remains stable.  No falls since we last saw him.   Labs independently reviewed: 05/2020 - potassium 4.7, BUN 19, serum creatinine 1.28, albumin 3.6, AST/ALT normal, Hgb 8.4, PLT 285, A1c 6.4, magnesium 1.525 11/2019 - TSH normal 10/2019 - TC 150, TG 77, HDL 47, LDL 88  Past Medical History:  Diagnosis Date  . Alcohol abuse   .  Alcoholic cardiomyopathy ([place]) 03/15/2013   a. 05/2019 Echo: EF 35-40%; b. 10/2019 Echo: EF 45-50%.  . Allergy   . Asthma   . 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 Chronic anemia   . Clotting disorder ([place])   . Diabetes mellitus, type 2 ([place])    pt reports his DM is gone  . GI bleed    15 years ago  . Gout   . Hemoptysis    secondary to pulmonary edema  . HFimpEF (heart failure with improved ejection fraction) ([place])    a. 12/2010 Echo: EF 20-25%; b 08/2013 Echo:  EF 45-50%; c. 01/2015 Echo: EF 25-30%; d. 07/2016 Echo: EF 35-40%; e. 05/2019 Echo: EF 35-40%; d. 10/2019 Echo: EF 45-50%, mild LVH, mild red RV fxn, mildl dil RA, Triv MR. Mild to mod Ao Sclerosis w/o stenosis.  . Hyperlipidemia   . Hypertension   . Nonischemic cardiomyopathy ([place])   . Nonobstructive Coronary artery disease    a. 01/2015 Cath: LM nl, LAD 30p/m, LCX nl, RCA 10p/m, RPDA min irregs.  . Osteoarthritis   . Pneumonia   . TIA (transient ischemic attack)   . Tobacco abuse   . Vision loss    peripherial vision only left eye.Central Retinal  artery occusion     Past Surgical History:  Procedure Laterality Date  . BIOPSY  11/25/2019   Procedure: BIOPSY;  Surgeon: Ladene Artist, MD;  Location: [place];  Service: Endoscopy;;  . CARDIAC CATHETERIZATION    . CARDIAC CATHETERIZATION N/A 01/28/2015   Procedure: Left Heart Cath and Coronary Angiography;  Surgeon: Wellington Hampshire, MD;  Location: [place] CV [facility];  Service: Cardiovascular;  Laterality: N/A;  . COLONOSCOPY    . ESOPHAGOGASTRODUODENOSCOPY (EGD) WITH PROPOFOL N/A 11/25/2019   Procedure: ESOPHAGOGASTRODUODENOSCOPY (EGD) WITH PROPOFOL;  Surgeon: Ladene Artist, MD;  Location: [facility] ENDOSCOPY;  Service: Endoscopy;  Laterality: N/A;  . HIP ARTHROPLASTY Right 03/15/2013   Procedure: ARTHROPLASTY BIPOLAR HIP;  Surgeon: [name], MD;  Location: [place];  Service: Orthopedics;  Laterality: Right;  . TOTAL SHOULDER ARTHROPLASTY Right 09/01/2015   Procedure: RIGHT TOTAL SHOULDER ARTHROPLASTY;  Surgeon: [name], MD;  Location: [place];  Service: Orthopedics;  Laterality: Right;    Current Medications: Current Meds  Medication Sig  . albuterol (VENTOLIN HFA) 108 (90 Base) MCG/ACT inhaler Inhale 2 puffs into the lungs every 6 (six) hours as needed for wheezing or shortness of breath.  . allopurinol (ZYLOPRIM) 100 MG tablet Take 200 mg by mouth daily.  [name] aspirin 325 MG tablet Take 162.5 mg by mouth daily.  [name]  atorvastatin (LIPITOR) 20 MG tablet Take 20 mg by mouth daily.  . carvedilol (COREG) 6.25 MG tablet Take 1 tablet (6.25 mg total) by mouth 2 (two) times daily.  . cetirizine (ZYRTEC) 10 MG tablet TAKE 1 TABLET BY MOUTH DAILY  . fenofibrate (TRICOR) 145 MG tablet TAKE 1 TABLET BY MOUTH ONCE A DAY  . ferrous sulfate 325 (65 FE) MG tablet Take 1 tablet (325 mg total) by mouth daily with breakfast.  . fluticasone (FLONASE) 50 MCG/ACT nasal spray Place 1 spray into both nostrils 2 (two) times daily.  . hydroxypropyl methylcellulose / hypromellose (ISOPTO TEARS / GONIOVISC) 2.5 % ophthalmic solution as needed for dry eyes.  . magnesium oxide (MAG-OX) 400 MG tablet Take 1 tablet (400 mg total) by mouth daily.  . Multiple Vitamin (MULTIVITAMIN WITH MINERALS) TABS tablet Take 1 tablet by mouth daily. Men's One  a Day  . Omega-3 Fatty Acids (FISH OIL PO) Take 1 capsule by mouth daily.  . pantoprazole (PROTONIX) 40 MG tablet Take 1 tablet (40 mg total) by mouth daily.  . sacubitril-valsartan (ENTRESTO) 24-26 MG Take 1 tablet by mouth 2 (two) times daily.  [name] triamcinolone (KENALOG) 0.1 % APPLY TO AFFECTED AREAS TWICE A DAY AS NEEDED. AVOID FACE,GROIN, OR UNDERARMS    Allergies:   Patient has no known allergies.   Social History   Socioeconomic History  . Marital status: Single    Spouse name: Not on file  . Number of children: 0  . Years of education: 65  . Highest education level: High school graduate  Occupational History  . Occupation: Retired  Tobacco Use  . Smoking status: Current Every Day Smoker    Packs/day: 0.50    Years: 30.00    Pack years: 15.00    Types: Cigarettes  . Smokeless tobacco: Never Used  Vaping Use  . Vaping Use: Never used  Substance and Sexual Activity  . Alcohol use: Not Currently    Alcohol/week: 1.0 standard drink    Types: 1 Glasses of wine per week    Comment: occasionally  . Drug use: Yes    Types: Marijuana  . Sexual activity: Yes    Birth  control/protection: Condom  Other Topics Concern  . Not on file  Social History Narrative  . Not on file   Social Determinants of Health   Financial Resource Strain: Low Risk   . Difficulty of Paying Living Expenses: Not hard at all  Food Insecurity: No Food Insecurity  . Worried About Charity fundraiser in the Last Year: Never true  . Ran Out of Food in the Last Year: Never true  Transportation Needs: No Transportation Needs  . Lack of Transportation (Medical): No  . Lack of Transportation (Non-Medical): No  Physical Activity: Inactive  . Days of Exercise per Week: 0 days  . Minutes of Exercise per Session: 0 min  Stress: No Stress Concern Present  . Feeling of Stress : Only a little  Social Connections: Socially Isolated  . Frequency of Communication with Friends and Family: More than three times a week  . Frequency of Social Gatherings with Friends and Family: More than three times a week  . Attends Religious Services: Never  . Active Member of Clubs or Organizations: No  . Attends Archivist Meetings: Never  . Marital Status: Never married     Family History:  The patient's family history includes Dementia in his brother; Diabetes in his father, mother, and sister; Hypertension in his father and mother. There is no history of Cancer, Heart disease, Stroke, Colon cancer, Esophageal cancer, Rectal cancer, or Stomach cancer.  ROS:   Review of Systems  Constitutional: Negative for chills, diaphoresis, fever, malaise/fatigue and weight loss.  HENT: Negative for congestion.   Eyes: Negative for discharge and redness.  Respiratory: Negative for cough, sputum production, shortness of breath and wheezing.   Cardiovascular: Negative for chest pain, palpitations, orthopnea, claudication, leg swelling and PND.  Gastrointestinal: Negative for abdominal pain, blood in stool, heartburn, melena, nausea and vomiting.  Musculoskeletal: Negative for falls and myalgias.  Skin:  Negative for rash.  Neurological: Negative for dizziness, tingling, tremors, sensory change, speech change, focal weakness, loss of consciousness and weakness.  Endo/Heme/Allergies: Does not bruise/bleed easily.  Psychiatric/Behavioral: Negative for substance abuse. The patient is not nervous/anxious.   All other systems reviewed and are negative.  EKGs/Labs/Other Studies Reviewed:    Studies reviewed were summarized above. The additional studies were reviewed today:  2D echo 10/2019: 1. Compared to echo done in March 2021 EF improved and thickening of MV  and subchordal apparatus more prominent.  2. Left ventricular ejection fraction, by estimation, is 45 to 50%. The  left ventricle has mildly decreased function. The left ventricle  demonstrates regional wall motion abnormalities (see scoring  diagram/findings for description). There is mild left  ventricular hypertrophy. Left ventricular diastolic parameters are  indeterminate.  3. Enlarged basal annulus . Right ventricular systolic function is mildly  reduced. The right ventricular size is mildly enlarged.  4. Right atrial size was mildly dilated.  5. MV is thickened cannot r/o SBE but likely reducndant sub chordal  apparatus consider TEE to further evaluate if suspicion of SBE high . The  mitral valve is abnormal. Trivial mitral valve regurgitation. No evidence  of mitral stenosis.  6. The aortic valve was not well visualized. Aortic valve regurgitation  is not visualized. Mild to moderate aortic valve sclerosis/calcification  is present, without any evidence of aortic stenosis.  7. The inferior vena cava is normal in size with greater than 50%  respiratory variability, suggesting right atrial pressure of 3 mmHg. __________  2D echo 05/2019: 1. Left ventricular ejection fraction, by estimation, is 35 to 40%. The  left ventricle has moderately decreased function. The left ventricle  demonstrates global hypokinesis. The  left ventricular internal cavity size  was mildly dilated. There is mild  left ventricular hypertrophy. Left ventricular diastolic parameters are  consistent with Grade I diastolic dysfunction (impaired relaxation).  2. Right ventricular systolic function is normal. The right ventricular  size is normal. Tricuspid regurgitation signal is inadequate for assessing  PA pressure. __________  2D echo 07/2016: - Left ventricle: The cavity size was normal. There was mild  concentric hypertrophy. Systolic function was moderately reduced.  The estimated ejection fraction was in the range of 35% to 40%.  Regional wall motion abnormalities cannot be excluded. Doppler  parameters are consistent with abnormal left ventricular  relaxation (grade 1 diastolic dysfunction).  - Mitral valve: Mild prolapse. There was trivial regurgitation.  - Right ventricle: The cavity size was normal. Systolic function  was normal. __________  LHC 01/2015:  Prox RCA to Mid RCA lesion, 10% stenosed.  Prox LAD to Mid LAD lesion, 30% stenosed.   1. Mild one-vessel coronary artery disease which does not seem to be different from most recent coronary angiography. 2. Moderate to severely reduced LV systolic function by noninvasive angiography. 3. Mildly elevated left ventricular end-diastolic pressure.  Recommendations: The patient has nonischemic cardiomyopathy likely alcohol induced with poor adherence to medical therapy. Continue medical therapy. __________  Carlton Adam MPI 01/2015:  Defect 1: There is a medium defect of moderate severity present in the mid anterior, mid anterolateral, apical anterior and apex location. The defect is partially reversible. This could be due to an artifact. However, previous infarct with moderate peri-infarct ischemia cannot be excluded.  ST segment depression was noted during stress. This was nondiagnostic due to abnormal baseline.  This is a high risk study mainly due  to severely reduced ejection fraction  Nuclear stress EF: 28%.  this study is overall very suboptimal due to intense GI activity interfering with the inferior wall.  correlate clinically. __________  2D echo 01/2015: - Left ventricle: The cavity size was mildly dilated. Systolic  function was severely reduced. The estimated ejection fraction  was in the  range of 25% to 30%. Diffuse hypokinesis. Regional  wall motion abnormalities cannot be excluded. Doppler parameters  are consistent with abnormal left ventricular relaxation (grade 1  diastolic dysfunction).  - Left atrium: The atrium was normal in size.  - Right ventricle: Systolic function was normal.  - Pulmonary arteries: Systolic pressure could not be estimated. __________  2D echo 08/2013: - Impressions: Limited study No doppler or M mode  Moderate LVE. No discrete RWMA;s  Diffuse hypokinesis worse in septum and inferior walls  EF 45-50%   Impressions:   - Limited study No doppler or M mode  Moderate LVE. No discrete RWMA;s  Diffuse hypokinesis worse in septum and inferior walls  EF 45-50%  __________  2d echo 08/2013: - Left ventricle: DIfficult apical windows. LVEF not well  visualized in apical windows. LV systolic function appears to be  moderately depressed.l Would recomm repeat evaluation with  contrast to further define wall motion. The cavity size was  mildly dilated. Wall thickness was normal.  __________  2D echo 04/2011: - Left ventricle: The cavity size was normal. There was mild  focal basal andmild concentric hypertrophy. Systolic  function was normal. The estimated ejection fraction was  in the range of 50% to 55%. Basal inferior, posterior and  septal wall hypokinesis. Most of the myocardium has good  wall motion.  - Mitral valve: Anterior leaflet redundancy. Trivial  regurgitation. Mild prolapse of anterior leaflet.  - Right ventricle: Systolic function  was normal.  - Pulmonary arteries: Systolic pressure was within the  normal range.    EKG:  EKG is ordered today.  The EKG ordered today demonstrates NSR, 93 bpm, left anterior fascicular block, no acute ST-T changes, largely unchanged when compared to prior tracing  Recent Labs: 11/20/2019: B Natriuretic Peptide 365.0 05/27/2020: TSH 0.392 05/28/2020: Magnesium 1.5 06/02/2020: Pro B Natriuretic peptide (BNP) 61.0 06/06/2020: ALT 40; BUN 19; Creatinine, Ser 1.28; Hemoglobin 8.4; Platelets 285; Potassium 4.7; Sodium 138  Recent Lipid Panel    Component Value Date/Time   CHOL 150 11/09/2019 0911   CHOL 138 06/18/2017 1425   TRIG 77 11/09/2019 0911   HDL 47 11/09/2019 0911   HDL 67 06/18/2017 1425   CHOLHDL 3.2 11/09/2019 0911   VLDL 15 11/09/2019 0911   LDLCALC 88 11/09/2019 0911   LDLCALC 51 06/18/2017 1425   LDLDIRECT 69.0 12/30/2018 1232    PHYSICAL EXAM:    VS:  BP 120/70 (BP Location: Left Arm, Patient Position: Sitting, Cuff Size: Normal)   Pulse 93   Ht '6\' 4"'$  (1.93 m)   Wt 177 lb 2 oz (80.3 kg)   BMI 21.56 kg/m   BMI: Body mass index is 21.56 kg/m.  Physical Exam Vitals reviewed.  Constitutional:      Appearance: He is well-developed.  HENT:     Head: Normocephalic and atraumatic.  Eyes:     General:        Right eye: No discharge.        Left eye: No discharge.  Neck:     Vascular: No JVD.  Cardiovascular:     Rate and Rhythm: Normal rate and regular rhythm.     Pulses: No midsystolic click and no opening snap.          Posterior tibial pulses are 2+ on the right side and 2+ on the left side.     Heart sounds: Normal heart sounds, S1 normal and S2 normal. Heart sounds not distant. No murmur heard. No friction  rub.  Pulmonary:     Effort: Pulmonary effort is normal. No respiratory distress.     Breath sounds: Normal breath sounds. No decreased breath sounds, wheezing or rales.  Chest:     Chest wall: No tenderness.  Abdominal:     General: There is no  distension.     Palpations: Abdomen is soft.     Tenderness: There is no abdominal tenderness.  Musculoskeletal:     Cervical back: Normal range of motion.  Skin:    General: Skin is warm and dry.     Nails: There is no clubbing.  Neurological:     Mental Status: He is alert and oriented to person, place, and time.  Psychiatric:        Speech: Speech normal.        Behavior: Behavior normal.        Thought Content: Thought content normal.        Judgment: Judgment normal.     Wt Readings from Last 3 Encounters:  06/20/20 177 lb 2 oz (80.3 kg)  06/06/20 175 lb (79.4 kg)  06/03/20 175 lb (79.4 kg)     ASSESSMENT & PLAN:   1. HFrEF secondary to NICM/alcoholic cardiomyopathy: He appears euvolemic and well compensated.  Hypotension has resolved.  Given this, we will initiate Entresto 24/26 mg twice daily.  Otherwise, he will continue carvedilol 6.25 mg twice daily.  Check BMP.  When he is seen in follow-up consider escalation of Entresto versus addition of MRA/SGLT2i.  CHF education discussed.  2. Hypotension: Resolved.  Precautions were discussed with addition of Entresto as outlined above.  3. AKI: Stable on last check.  Update BMP.  4. Chronic anemia with history of GI bleed: Stable on most recent check.  He remains on Protonix and ferrous sulfate.  Follow-up with hematology and GI as directed.  5. Substance abuse: He has not had a drink of alcohol since 05/25/2020.  Congratulations were given.  He does continue to smoke cigarettes at 1 pack/day and is not yet ready to quit.  Complete cessation of tobacco is recommended.  Not interested in pharmacologic agents at this time.  6. TIA: He remains on aspirin 162 mg daily.  Follow-up with PCP as directed.  Disposition: F/u with Dr. Fletcher Anon or an APP in 4 weeks.   Medication Adjustments/Labs and Tests Ordered: Current medicines are reviewed at length with the patient today.  Concerns regarding medicines are outlined above. Medication  changes, Labs and Tests ordered today are summarized above and listed in the Patient Instructions accessible in Encounters.   SignedChristell Faith, PA-C 06/20/2020 11:00 AM     [address], [place] [address] [phone]

## 2020-06-20 ENCOUNTER — Other Ambulatory Visit: Payer: Self-pay

## 2020-06-20 ENCOUNTER — Ambulatory Visit: Payer: Medicare HMO | Admitting: Physician Assistant

## 2020-06-20 ENCOUNTER — Encounter: Payer: Self-pay | Admitting: Physician Assistant

## 2020-06-20 ENCOUNTER — Telehealth: Payer: Self-pay | Admitting: Physician Assistant

## 2020-06-20 VITALS — BP 120/70 | HR 93 | Ht 76.0 in | Wt 177.1 lb

## 2020-06-20 DIAGNOSIS — I428 Other cardiomyopathies: Secondary | ICD-10-CM

## 2020-06-20 DIAGNOSIS — I5020 Unspecified systolic (congestive) heart failure: Secondary | ICD-10-CM | POA: Diagnosis not present

## 2020-06-20 DIAGNOSIS — N179 Acute kidney failure, unspecified: Secondary | ICD-10-CM

## 2020-06-20 DIAGNOSIS — I5022 Chronic systolic (congestive) heart failure: Secondary | ICD-10-CM | POA: Diagnosis not present

## 2020-06-20 DIAGNOSIS — I959 Hypotension, unspecified: Secondary | ICD-10-CM | POA: Diagnosis not present

## 2020-06-20 DIAGNOSIS — Z8719 Personal history of other diseases of the digestive system: Secondary | ICD-10-CM

## 2020-06-20 DIAGNOSIS — Z720 Tobacco use: Secondary | ICD-10-CM | POA: Diagnosis not present

## 2020-06-20 DIAGNOSIS — Z8673 Personal history of transient ischemic attack (TIA), and cerebral infarction without residual deficits: Secondary | ICD-10-CM

## 2020-06-20 DIAGNOSIS — D649 Anemia, unspecified: Secondary | ICD-10-CM | POA: Diagnosis not present

## 2020-06-20 LAB — VITAMIN B6: Vitamin B6: 13.3 ug/L (ref 5.3–46.7)

## 2020-06-20 MED ORDER — SACUBITRIL-VALSARTAN 24-26 MG PO TABS: 1.0000 | ORAL_TABLET | Freq: Two times a day (BID) | ORAL | 6 refills | Status: DC

## 2020-06-20 NOTE — Telephone Encounter (Signed)
Patient assistance application completed and sent to [facility] via fax. Will place in file in medication closet.

## 2020-06-20 NOTE — Patient Instructions (Signed)
Medication Instructions:  Your physician has recommended you make the following change in your medication:   1. START Entresto 24/26 mg one tablet twice a day  *If you need a refill on your cardiac medications before your next appointment, please call your pharmacy*   Lab Work: BMET today here in the office.  If you have labs (blood work) drawn today and your tests are completely normal, you will receive your results only by: [name] MyChart Message (if you have MyChart) OR . A paper copy in the mail If you have any lab test that is abnormal or we need to change your treatment, we will call you to review the results.   Testing/Procedures: None   Follow-Up: At [facility], you and your health needs are our priority.  As part of our continuing mission to provide you with exceptional heart care, we have created designated Provider Care Teams.  These Care Teams include your primary Cardiologist (physician) and Advanced Practice Providers (APPs -  Physician Assistants and Nurse Practitioners) who all work together to provide you with the care you need, when you need it.   Your next appointment:   4 week(s)  The format for your next appointment:   In Person  Provider:   Kathlyn Sacramento, MD or Christell Faith, PA-C

## 2020-06-21 ENCOUNTER — Inpatient Hospital Stay (HOSPITAL_BASED_OUTPATIENT_CLINIC_OR_DEPARTMENT_OTHER): Payer: Medicare HMO | Admitting: Oncology

## 2020-06-21 ENCOUNTER — Ambulatory Visit: Payer: Medicare HMO

## 2020-06-21 ENCOUNTER — Other Ambulatory Visit
Admission: RE | Admit: 2020-06-21 | Discharge: 2020-06-21 | Disposition: A | Discharge status: Home / Self Care | Payer: Medicare HMO | Source: Ambulatory Visit | Attending: Physician Assistant | Admitting: Physician Assistant

## 2020-06-21 ENCOUNTER — Encounter: Payer: Self-pay | Admitting: Oncology

## 2020-06-21 ENCOUNTER — Other Ambulatory Visit: Payer: Self-pay

## 2020-06-21 ENCOUNTER — Telehealth: Payer: Self-pay

## 2020-06-21 VITALS — BP 134/77 | HR 87 | Temp 97.0°F | Wt 178.1 lb

## 2020-06-21 DIAGNOSIS — D509 Iron deficiency anemia, unspecified: Secondary | ICD-10-CM | POA: Diagnosis not present

## 2020-06-21 DIAGNOSIS — R5383 Other fatigue: Secondary | ICD-10-CM | POA: Diagnosis not present

## 2020-06-21 DIAGNOSIS — E43 Unspecified severe protein-calorie malnutrition: Secondary | ICD-10-CM | POA: Diagnosis not present

## 2020-06-21 DIAGNOSIS — D539 Nutritional anemia, unspecified: Secondary | ICD-10-CM | POA: Diagnosis not present

## 2020-06-21 DIAGNOSIS — I5022 Chronic systolic (congestive) heart failure: Secondary | ICD-10-CM

## 2020-06-21 DIAGNOSIS — D61818 Other pancytopenia: Secondary | ICD-10-CM | POA: Diagnosis not present

## 2020-06-21 DIAGNOSIS — E1122 Type 2 diabetes mellitus with diabetic chronic kidney disease: Secondary | ICD-10-CM | POA: Diagnosis not present

## 2020-06-21 DIAGNOSIS — D500 Iron deficiency anemia secondary to blood loss (chronic): Secondary | ICD-10-CM | POA: Diagnosis not present

## 2020-06-21 DIAGNOSIS — J449 Chronic obstructive pulmonary disease, unspecified: Secondary | ICD-10-CM | POA: Diagnosis not present

## 2020-06-21 DIAGNOSIS — M1990 Unspecified osteoarthritis, unspecified site: Secondary | ICD-10-CM | POA: Diagnosis not present

## 2020-06-21 DIAGNOSIS — N183 Chronic kidney disease, stage 3 unspecified: Secondary | ICD-10-CM | POA: Diagnosis not present

## 2020-06-21 DIAGNOSIS — I130 Hypertensive heart and chronic kidney disease with heart failure and stage 1 through stage 4 chronic kidney disease, or unspecified chronic kidney disease: Secondary | ICD-10-CM | POA: Diagnosis not present

## 2020-06-21 DIAGNOSIS — R531 Weakness: Secondary | ICD-10-CM | POA: Diagnosis not present

## 2020-06-21 LAB — BASIC METABOLIC PANEL
Anion gap: 8 (ref 5–15)
BUN/Creatinine Ratio: 13 (ref 10–24)
BUN: 18 mg/dL (ref 8–27)
BUN: 21 mg/dL (ref 8–23)
CO2: 22 mmol/L (ref 20–29)
CO2: 28 mmol/L (ref 22–32)
Calcium: 9.8 mg/dL (ref 8.6–10.2)
Calcium: 10.1 mg/dL (ref 8.9–10.3)
Chloride: 100 mmol/L (ref 96–106)
Chloride: 102 mmol/L (ref 98–111)
Creatinine, Ser: 1.42 mg/dL — ABNORMAL HIGH (ref 0.76–1.27)
Creatinine, Ser: 1.26 mg/dL — ABNORMAL HIGH (ref 0.61–1.24)
GFR, Estimated: >60 mL/min (ref >60)
Glucose, Bld: 90 mg/dL (ref 70–99)
Glucose: 143 mg/dL — ABNORMAL HIGH (ref 65–99)
Potassium: 5.4 mmol/L — ABNORMAL HIGH (ref 3.5–5.2)
Potassium: 4.4 mmol/L (ref 3.5–5.1)
Sodium: 138 mmol/L (ref 134–144)
Sodium: 138 mmol/L (ref 135–145)
eGFR: 55 mL/min/{1.73_m2} — ABNORMAL LOW (ref >59)

## 2020-06-21 NOTE — Patient Instructions (Signed)
Visit Information  PATIENT GOALS: Goals Addressed            This Visit's Progress   . Manage anemia   On track    Timeframe:  Long-Range Goal Priority:  High Start Date:  06/06/2020                           Expected End Date:   46/30/2022  Follow up:  07/06/2020  Patient goals:                - Eat iron rich foods such as: lean meats and fish; Garrett Bowring leafy vegetables, such as kale and spinach; Brynlyn Dade beans, eggs; dried fruits, such as dates and figs; broccoli.  - Take your iron medication as advised by your doctor. - Follow up with Hematology specialist as advised. - Avoid alcohol - Have lab work done as directed by your doctor - Monitor for bleeding from nose, gums, in urine or stool to provider and report these symptoms to your doctor as soon as possible.      . Track and Manage  heart failure Symptoms   On track    Timeframe:  Long-Range Goal Priority:  High Start Date: 04/27/2020                         Expected End Date:  09/22/2020                     Follow Up Date 07/06/2020   . Patient will: - Takes Heart Failure Medications as prescribed and pick up your refilled prescriptions from the pharmacy  Try to weigh daily and record (notifying MD of 3 lb weight gain over night or 5 lb in a week)  Review and familiarize yourself with the Heart failure Action Plan  Follow a low salt diet - eat more whole grains, fruits and vegetables, lean meats and healthy fats - follow rescue plan for symptoms flare-up - Continue to keep your follow up appointments with your doctors.  - Continue to watch for swelling in feet, ankles and legs every day - Schedule appointment for COVID 19 booster.  - Continue to work on decreasing the number of cigarettes per day. (Obtain  / use over the counter smoking patch as advised by your doctor)  -[place] [phone] to set up new primary care provider appointment.    Why is this important?   You will be able to handle  your symptoms better if you keep track of them.  Making some simple changes to your lifestyle will help.  Eating healthy is one thing you can do to take good care of yourself.    Notes:          Patient verbalizes understanding of instructions provided today and agrees to view in [place].   The patient has been provided with contact information for the care management team and has been advised to call with any health related questions or concerns.  The care management team will reach out to the patient again over the next 30 days.   [name] RN,BSN,CCM RN Case Manager [place]  [phone]

## 2020-06-21 NOTE — Chronic Care Management (AMB) (Signed)
Chronic Care Management   CCM RN Visit Note  06/21/2020 Name: Travis [name] MRN: [id number] DOB: May 04, 1955  Subjective: Travis [name] is a 65 y.o. year old male who is a primary care patient of [name], NP. The care management team was consulted for assistance with disease management and care coordination needs.    Engaged with patient by telephone for follow up visit in response to provider referral for case management and/or care coordination services.   Consent to Services:  The patient was given information about Chronic Care Management services, agreed to services, and gave verbal consent prior to initiation of services.  Please see initial visit note for detailed documentation.   Patient agreed to services and verbal consent obtained.   Assessment: Review of patient past medical history, allergies, medications, health status, including review of consultants reports, laboratory and other test data, was performed as part of comprehensive evaluation and provision of chronic care management services.   SDOH (Social Determinants of Health) assessments and interventions performed:    CCM Care Plan  No Known Allergies  Outpatient Encounter Medications as of 06/21/2020  Medication Sig Note  . albuterol (VENTOLIN HFA) 108 (90 Base) MCG/ACT inhaler Inhale 2 puffs into the lungs every 6 (six) hours as needed for wheezing or shortness of breath. (Patient not taking: Reported on 06/21/2020)   . allopurinol (ZYLOPRIM) 100 MG tablet Take 200 mg by mouth daily.   [name] aspirin 325 MG tablet Take 162.5 mg by mouth daily.   [name] atorvastatin (LIPITOR) 20 MG tablet Take 20 mg by mouth daily.   . carvedilol (COREG) 6.25 MG tablet Take 1 tablet (6.25 mg total) by mouth 2 (two) times daily.   . cetirizine (ZYRTEC) 10 MG tablet TAKE 1 TABLET BY MOUTH DAILY   . fenofibrate (TRICOR) 145 MG tablet TAKE 1 TABLET BY MOUTH ONCE A DAY   . ferrous sulfate 325 (65 FE) MG tablet Take 1 tablet (325 mg total)  by mouth daily with breakfast.   . fluticasone (FLONASE) 50 MCG/ACT nasal spray Place 1 spray into both nostrils 2 (two) times daily. (Patient not taking: Reported on 06/21/2020) 06/06/2020: Patient reports he uses as needed.   . hydroxypropyl methylcellulose / hypromellose (ISOPTO TEARS / GONIOVISC) 2.5 % ophthalmic solution as needed for dry eyes.   . magnesium oxide (MAG-OX) 400 MG tablet Take 1 tablet (400 mg total) by mouth daily.   . Multiple Vitamin (MULTIVITAMIN WITH MINERALS) TABS tablet Take 1 tablet by mouth daily. Men's One a Day   . Omega-3 Fatty Acids (FISH OIL PO) Take 1 capsule by mouth daily.   . pantoprazole (PROTONIX) 40 MG tablet Take 1 tablet (40 mg total) by mouth daily.   . sacubitril-valsartan (ENTRESTO) 24-26 MG Take 1 tablet by mouth 2 (two) times daily. (Patient not taking: Reported on 06/21/2020)   . triamcinolone (KENALOG) 0.1 % APPLY TO AFFECTED AREAS TWICE A DAY AS NEEDED. AVOID FACE,GROIN, OR UNDERARMS    No facility-administered encounter medications on file as of 06/21/2020.    Patient Active Problem List   Diagnosis Date Noted  . Protein-calorie malnutrition, severe 05/27/2020  . Hypotension 05/26/2020  . AKI (acute kidney injury) ([place]) 05/26/2020  . Alcohol use 11/20/2019  . CKD (chronic kidney disease), stage III ([place]) 11/20/2019  . TIA (transient ischemic attack) 11/08/2019  . Osteoarthritis 05/09/2016  . COPD (chronic obstructive pulmonary disease) ([place]) 05/09/2016  . Essential hypertension 04/18/2015  . Literacy level of illiterate 06/04/2014  . CRA (  central retinal artery occlusion) 09/13/2013  . Cardiomyopathy, nonischemic ([place]) 03/15/2013  . Chronic systolic heart failure ([place]) 01/05/2011  . Tobacco use 01/05/2011  . Diabetes ([place]) 11/30/2008  . HLD (hyperlipidemia) 11/30/2008  . Gout 11/30/2008    Conditions to be addressed/monitored:CHF and anemia  Care Plan : RNCM Heart Failure ( Adult)  Updates made by Dannielle Karvonen, RN since 06/21/2020  12:00 AM  Problem: Symptom Exacerbation (Heart Failure)   Priority: High  Long-Range Goal: Patient will minimize heart failure exacerbation   Start Date: 04/27/2020  Expected End Date: 07/22/2020  This Visit's Progress: On track  Recent Progress: On track  Priority: High  Current Barriers:  [name] Knowledge deficit related to basic heart failure pathophysiology and self care management: Patient reports he is not weighing and recording weights daily.  . Knowledge Deficits related to heart failure medications . Literacy Barriers: patient reports his niece assists him with reading / understanding medical documentation. Patient will need to secure new primary care provider.  Case Manager Clinical Goal(s):  [name] Patient will verbalize understanding of Heart Failure Action Plan and when to call doctor . Patient will weigh daily and record (notifying MD of 3 lb weight gain over night or 5 lb in a week) Interventions:  . Collaboration with [name], NP regarding development and update of comprehensive plan of care as evidenced by provider attestation and co-signature:  RNCM confirmed with Virgel Manifold that patient's primary care provider, [name], NP would be leaving the practice on 07/07/2020. Office staff states the [facility] will not be accepting new patients at this time. Office states the [facility] is accepting new patient and provided contact phone number for patient to call.   RNCM notified patient of primary care providers plan to leave practice.  Provided patient with contact phone number to [employer] to obtain a new primary care provider.  Bertram Savin care team collaboration (see longitudinal plan of care) . Basic overview and discussion of pathophysiology of Heart Failure reviewed  . Provided verbal education on low sodium diet . Reviewed Heart Failure Action Plan in depth.  . Discussed importance of daily  weight and advised patient to weigh and record daily . Reviewed role of diuretics in prevention of fluid overload and management of heart failure . - medication-adherence assessment completed . - rescue (action) plan reviewed . - self-awareness of signs/symptoms of worsening disease encouraged:   . Patient advised to scheduled COVID 19 booster:  Informed patient he can receive COVID booster at local drug stores such as [facility] or [facility].  . Smoking cessation discussed:  Primary care provider request patient use over the counter patches for smoking cessation. Patient called and notified by [facility] to use over the counter smoking cessation patches. Patient verbalized understanding.  Patient Goals/Self-Care Activities . Patient will: - Takes Heart Failure Medications as prescribed and pick up your refilled prescriptions from the pharmacy  Try to weigh daily and record (notifying MD of 3 lb weight gain over night or 5 lb in a week)  Review and familiarize yourself with the Heart failure Action Plan  Follow a low salt diet - eat more whole grains, fruits and vegetables, lean meats and healthy fats - follow rescue plan for symptoms flare-up - Continue to keep your follow up appointments with your doctors.  - Continue to watch for swelling in feet, ankles and legs every day - Schedule appointment for COVID 19 booster.  -  Continue to work on decreasing the number of cigarettes per day. (Obtain  / use over the counter smoking patch as advised by your doctor)  -[place] [phone] to set up new primary care provider appointment.  Follow Up Plan: The patient has been provided with contact information for the care management team and has been advised to call with any health related questions or concerns.  The care management team will reach out to the patient again over the next 30 days.       Plan:The patient has been provided with contact information for the care management  team and has been advised to call with any health related questions or concerns.  and The care management team will reach out to the patient again over the next 30 days. [name] RN,BSN,CCM RN Case Manager [place]  [phone]

## 2020-06-21 NOTE — Telephone Encounter (Signed)
Called patient and reviewed the recommendations given by Christell Faith. Patient stated that he would prefer to get the lab draw today while he is here for another appointment. I informed him that would be okay, but if labs are still compromised, then we will have to have him get another draw soon after. Patient was okay with this. Patient verbalized understanding and agreed with plan.

## 2020-06-21 NOTE — Telephone Encounter (Signed)
-----   Message from Rise Mu, PA-C sent at 06/21/2020 10:34 AM EDT ----- Random glucose is mildly elevated with known diabetes Potassium is minimally elevated Renal function is when compared to last check, though within range of how this has been running   Recommendations: -I would like for him to hold off on restarting Entresto given slight bump in renal function an potassium as this medication can further increase potassium at times -Please have him come to the medical mall for a BMET on 3/30 to trend his potassium and make sure this is stable to improved -Please ensure he is not using salt substitutes or seasonings that are high in potassium  -He should increase his water intake by a little, maybe an additional 16-20 oz per day -Follow up as directed for ongoing diabetic care -Result note was routed to triage pool given Pam is off today

## 2020-06-22 ENCOUNTER — Telehealth: Payer: Self-pay | Admitting: *Deleted

## 2020-06-22 NOTE — Telephone Encounter (Signed)
Please see result note 

## 2020-06-22 NOTE — Telephone Encounter (Signed)
Error

## 2020-06-22 NOTE — Addendum Note (Signed)
Addended by: Valora Corporal on: 06/22/2020 01:45 PM   Modules accepted: Orders

## 2020-06-22 NOTE — Telephone Encounter (Signed)
Results and recommendations reviewed with patient and he verbalized understanding. Instructed him to have repeat labs done at the [facility] in one week once he starts new medication. He was agreeable with plan and had no further questions at this time.        Repeat potassium is improved and at goal. Renal function is also improved and consistent with prior reading.   Recommendations:  -Ok to start previously recommended Entresto 24/26 mg bid  -Follow up BMET 1 week after starting Entresto  -Continue to avoid salt substitutes/seasonings that are high in potassium

## 2020-06-22 NOTE — Progress Notes (Signed)
Hematology/Oncology Consult note [facility]  Telephone:([phone] [phone]  Patient Care Team: [name], NP as PCP - General (Internal Medicine) Wellington Hampshire, MD as PCP - Cardiology (Cardiology) Alisa Graff, FNP as Nurse Practitioner (Cardiology) Wellington Hampshire, MD as Consulting Physician (Cardiology) Dannielle Karvonen, RN as Case Manager   Name of the patient: Travis [name]  [id number]  [birth year]   Date of visit: 06/22/20  Diagnosis-macrocytic anemia  Chief complaint/ Reason for visit-discuss results of blood work  Heme/Onc history: patient is a 65 year old male with a past medical history significant for hypertension hyperlipidemia CKD referred for pancytopenia.  He also has a history of nonischemic cardiomyopathy and central retinal artery occlusion.  Most recent CBC from 06/02/2020 showed white count of 4.3, H&H of 9.7/29.4 with an MCV of 104 and a platelet count of 22.  Looking back at his prior CBCs patient's white count has been mostly around 4 with an [place] that fluctuates between 1.3-6.  He has had intermittent thrombocytopenia in the past since 2020 when his platelet counts have fluctuated between 100s to 120s but have also been normal sometimes.  Hemoglobin likewise has varied between 7-9 since August 2021 but prior to that he was normal at 12.  Patient was hospitalized for blood loss anemia secondary to nonbleeding prepyloric gastric ulcer as well as gastritis and duodenal erosions.  Patient presently reports mild fatigue but denies other complaints at this time.  Patient was drinking alcohol up until August 2021But quit drinking after his hospitalization in August.  Interval history-other than ongoing fatigue patient denies other complaints at this time.  Appetite and weight have remained stable.  ECOG PS- 1 Pain scale- 0   Review of systems- Review of Systems  Constitutional: Positive for malaise/fatigue. Negative for  chills, fever and weight loss.  HENT: Negative for congestion, ear discharge and nosebleeds.   Eyes: Negative for blurred vision.  Respiratory: Negative for cough, hemoptysis, sputum production, shortness of breath and wheezing.   Cardiovascular: Negative for chest pain, palpitations, orthopnea and claudication.  Gastrointestinal: Negative for abdominal pain, blood in stool, constipation, diarrhea, heartburn, melena, nausea and vomiting.  Genitourinary: Negative for dysuria, flank pain, frequency, hematuria and urgency.  Musculoskeletal: Negative for back pain, joint pain and myalgias.  Skin: Negative for rash.  Neurological: Negative for dizziness, tingling, focal weakness, seizures, weakness and headaches.  Endo/Heme/Allergies: Does not bruise/bleed easily.  Psychiatric/Behavioral: Negative for depression and suicidal ideas. The patient does not have insomnia.      No Known Allergies   Past Medical History:  Diagnosis Date  . Alcohol abuse   . 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 05/2019 Echo: EF 35-40%; b. 10/2019 Echo: EF 45-50%.  . Allergy   . Asthma   . 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 Chronic anemia   . Clotting disorder ([place])   . Diabetes mellitus, type 2 ([place])    pt reports his DM is gone  . GI bleed    15 years ago  . Gout   . Hemoptysis    secondary to pulmonary edema  . HFimpEF (heart failure with improved ejection fraction) ([place])    a. 12/2010 Echo: EF 20-25%; b 08/2013 Echo: EF 45-50%; c. 01/2015 Echo: EF 25-30%; d. 07/2016 Echo: EF 35-40%; e. 05/2019 Echo: EF 35-40%; d. 10/2019 Echo: EF 45-50%, mild LVH, mild red RV fxn, mildl dil RA, Triv MR. Mild to mod Ao Sclerosis w/o  stenosis.  . Hyperlipidemia   . Hypertension   . Nonischemic cardiomyopathy ([place])   . Nonobstructive Coronary artery disease    a. 01/2015 Cath: LM nl, LAD 30p/m, LCX nl, RCA 10p/m, RPDA min irregs.  . Osteoarthritis   . Pneumonia   . TIA (transient ischemic  attack)   . Tobacco abuse   . Vision loss    peripherial vision only left eye.Central Retinal  artery occusion      Past Surgical History:  Procedure Laterality Date  . BIOPSY  11/25/2019   Procedure: BIOPSY;  Surgeon: Ladene Artist, MD;  Location: [place];  Service: Endoscopy;;  . CARDIAC CATHETERIZATION    . CARDIAC CATHETERIZATION N/A 01/28/2015   Procedure: Left Heart Cath and Coronary Angiography;  Surgeon: Wellington Hampshire, MD;  Location: [place] CV LAB;  Service: Cardiovascular;  Laterality: N/A;  . COLONOSCOPY    . ESOPHAGOGASTRODUODENOSCOPY (EGD) WITH PROPOFOL N/A 11/25/2019   Procedure: ESOPHAGOGASTRODUODENOSCOPY (EGD) WITH PROPOFOL;  Surgeon: Ladene Artist, MD;  Location: [facility] ENDOSCOPY;  Service: Endoscopy;  Laterality: N/A;  . HIP ARTHROPLASTY Right 03/15/2013   Procedure: ARTHROPLASTY BIPOLAR HIP;  Surgeon: [name], MD;  Location: [place];  Service: Orthopedics;  Laterality: Right;  . TOTAL SHOULDER ARTHROPLASTY Right 09/01/2015   Procedure: RIGHT TOTAL SHOULDER ARTHROPLASTY;  Surgeon: [name], MD;  Location: [place];  Service: Orthopedics;  Laterality: Right;    Social History   Socioeconomic History  . Marital status: Single    Spouse name: Not on file  . Number of children: 0  . Years of education: 21  . Highest education level: High school graduate  Occupational History  . Occupation: Retired  Tobacco Use  . Smoking status: Current Every Day Smoker    Packs/day: 0.50    Years: 30.00    Pack years: 15.00    Types: Cigarettes  . Smokeless tobacco: Never Used  Vaping Use  . Vaping Use: Never used  Substance and Sexual Activity  . Alcohol use: Not Currently    Alcohol/week: 1.0 standard drink    Types: 1 Glasses of wine per week    Comment: occasionally  . Drug use: Yes    Types: Marijuana  . Sexual activity: Yes    Birth control/protection: Condom  Other Topics Concern  . Not on file  Social History Narrative  . Not on file    Social Determinants of Health   Financial Resource Strain: Low Risk   . Difficulty of Paying Living Expenses: Not hard at all  Food Insecurity: No Food Insecurity  . Worried About Charity fundraiser in the Last Year: Never true  . Ran Out of Food in the Last Year: Never true  Transportation Needs: No Transportation Needs  . Lack of Transportation (Medical): No  . Lack of Transportation (Non-Medical): No  Physical Activity: Inactive  . Days of Exercise per Week: 0 days  . Minutes of Exercise per Session: 0 min  Stress: No Stress Concern Present  . Feeling of Stress : Only a little  Social Connections: Socially Isolated  . Frequency of Communication with Friends and Family: More than three times a week  . Frequency of Social Gatherings with Friends and Family: More than three times a week  . Attends Religious Services: Never  . Active Member of Clubs or Organizations: No  . Attends Archivist Meetings: Never  . Marital Status: Never married  Intimate Partner Violence: Not At Risk  . Fear of  Current or Ex-Partner: No  . Emotionally Abused: No  . Physically Abused: No  . Sexually Abused: No    Family History  Problem Relation Age of Onset  . Diabetes Mother   . Hypertension Mother   . Diabetes Father   . Hypertension Father   . Diabetes Sister   . Dementia Brother   . Cancer Neg Hx   . Heart disease Neg Hx   . Stroke Neg Hx   . Colon cancer Neg Hx   . Esophageal cancer Neg Hx   . Rectal cancer Neg Hx   . Stomach cancer Neg Hx      Current Outpatient Medications:  .  allopurinol (ZYLOPRIM) 100 MG tablet, Take 200 mg by mouth daily., Disp: , Rfl:  .  aspirin 325 MG tablet, Take 162.5 mg by mouth daily., Disp: , Rfl:  .  atorvastatin (LIPITOR) 20 MG tablet, Take 20 mg by mouth daily., Disp: , Rfl:  .  carvedilol (COREG) 6.25 MG tablet, Take 1 tablet (6.25 mg total) by mouth 2 (two) times daily., Disp: 180 tablet, Rfl: 3 .  cetirizine (ZYRTEC) 10 MG tablet,  TAKE 1 TABLET BY MOUTH DAILY, Disp: 30 tablet, Rfl: 11 .  fenofibrate (TRICOR) 145 MG tablet, TAKE 1 TABLET BY MOUTH ONCE A DAY, Disp: 90 tablet, Rfl: 0 .  ferrous sulfate 325 (65 FE) MG tablet, Take 1 tablet (325 mg total) by mouth daily with breakfast., Disp: 90 tablet, Rfl: 2 .  hydroxypropyl methylcellulose / hypromellose (ISOPTO TEARS / GONIOVISC) 2.5 % ophthalmic solution, as needed for dry eyes., Disp: , Rfl:  .  magnesium oxide (MAG-OX) 400 MG tablet, Take 1 tablet (400 mg total) by mouth daily., Disp: 30 tablet, Rfl: 1 .  Multiple Vitamin (MULTIVITAMIN WITH MINERALS) TABS tablet, Take 1 tablet by mouth daily. Men's One a Day, Disp: , Rfl:  .  Omega-3 Fatty Acids (FISH OIL PO), Take 1 capsule by mouth daily., Disp: , Rfl:  .  pantoprazole (PROTONIX) 40 MG tablet, Take 1 tablet (40 mg total) by mouth daily., Disp: 90 tablet, Rfl: 2 .  triamcinolone (KENALOG) 0.1 %, APPLY TO AFFECTED AREAS TWICE A DAY AS NEEDED. AVOID FACE,GROIN, OR UNDERARMS, Disp: 454 g, Rfl: 0 .  albuterol (VENTOLIN HFA) 108 (90 Base) MCG/ACT inhaler, Inhale 2 puffs into the lungs every 6 (six) hours as needed for wheezing or shortness of breath. (Patient not taking: Reported on 06/21/2020), Disp: , Rfl:  .  fluticasone (FLONASE) 50 MCG/ACT nasal spray, Place 1 spray into both nostrils 2 (two) times daily. (Patient not taking: Reported on 06/21/2020), Disp: 16 g, Rfl: 0 .  sacubitril-valsartan (ENTRESTO) 24-26 MG, Take 1 tablet by mouth 2 (two) times daily. (Patient not taking: Reported on 06/21/2020), Disp: 60 tablet, Rfl: 6  Physical exam:  Vitals:   06/21/20 1437  BP: 134/77  Pulse: 87  Temp: (!) 97 F (36.1 C)  TempSrc: Tympanic  SpO2: 100%  Weight: 178 lb 1.6 oz (80.8 kg)   Physical Exam Eyes:     Pupils: Pupils are equal, round, and reactive to light.  Cardiovascular:     Rate and Rhythm: Normal rate and regular rhythm.     Heart sounds: Normal heart sounds.  Pulmonary:     Effort: Pulmonary effort is  normal.     Breath sounds: Normal breath sounds.  Skin:    General: Skin is warm and dry.  Neurological:     Mental Status: He is alert and oriented to  person, place, and time.      CMP Latest Ref Rng & Units 06/21/2020  Glucose 70 - 99 mg/dL 90  BUN 8 - 23 mg/dL 21  Creatinine 0.61 - 1.24 mg/dL 1.26(H)  Sodium 135 - 145 mmol/L 138  Potassium 3.5 - 5.1 mmol/L 4.4  Chloride 98 - 111 mmol/L 102  CO2 22 - 32 mmol/L 28  Calcium 8.9 - 10.3 mg/dL 10.1  Total Protein 6.5 - 8.1 g/dL -  Total Bilirubin 0.3 - 1.2 mg/dL -  Alkaline Phos 38 - 126 U/L -  AST 15 - 41 U/L -  ALT 0 - 44 U/L -   CBC Latest Ref Rng & Units 06/06/2020  WBC 4.0 - 10.5 K/uL 4.4  Hemoglobin 13.0 - 17.0 g/dL 8.4(L)  Hematocrit 39.0 - 52.0 % 27.1(L)  Platelets 150 - 400 K/uL 285    No images are attached to the encounter.  DG Chest Portable 1 View  Result Date: 05/26/2020 CLINICAL DATA:  Chest pain EXAM: PORTABLE CHEST 1 VIEW COMPARISON:  11/20/2019 FINDINGS: Cardiac shadow is within normal limits. The lungs are well aerated bilaterally. No focal infiltrate or effusion is seen. No acute bony abnormality is noted. Right shoulder replacement is seen. IMPRESSION: No active disease. Electronically Signed   By: Inez Catalina M.D.   On: 05/26/2020 11:50     Assessment and plan- Patient is a 65 y.o. male referred for pancytopenia mainly macrocytic anemia  Looking back at patient's CBCs patient has had intermittent leukopenia Which has now resolved and his white count is between 4-4.5 which is his baseline.  He had mild thrombocytopenia intermittently as well which has resolved.  However what is most striking his anemia.  Patient's hemoglobin was between 11-12 until May 2021.  He was hospitalized in August 2021 for GI bleeding from ulcer which was subsequently treated.  He stopped drinking alcohol since August 2021.  However his hemoglobin has not improved.  It went down to 6.6 in August and came up to 12 in October 2021.  But  again it has dropped down and over the last 1 month it is between 8-9.  He also has gradual onset macrocytosis.  Results of anemia work-up did not reveal an obvious etiology.  Myeloma panel was unremarkable, iron studies B12 folate and B1 B6 have all been normal.  We discussed proceeding with a bone marrow biopsy at this time to a certain the etiology of macrocytic anemia.  Patient would like to wait a little longer.     I will repeat his CBC with differential in 1 month and 6 2 months and see him back in 2 months.  If his anemia remains significant with worsening macrocytosis I would favor getting a bone marrow biopsy to rule out a primary hematological process involving the bone marrow   Visit Diagnosis 1. Macrocytic anemia      Dr. Randa Evens, MD, MPH [facility] [phone] 06/22/2020 1:19 PM

## 2020-07-04 ENCOUNTER — Telehealth: Payer: Medicare HMO

## 2020-07-07 ENCOUNTER — Ambulatory Visit (INDEPENDENT_AMBULATORY_CARE_PROVIDER_SITE_OTHER): Payer: Medicare HMO

## 2020-07-07 ENCOUNTER — Other Ambulatory Visit: Payer: Self-pay | Admitting: *Deleted

## 2020-07-07 DIAGNOSIS — D509 Iron deficiency anemia, unspecified: Secondary | ICD-10-CM

## 2020-07-07 DIAGNOSIS — I5022 Chronic systolic (congestive) heart failure: Secondary | ICD-10-CM

## 2020-07-07 MED ORDER — CETIRIZINE HCL 10 MG PO TABS: 10.0000 mg | ORAL_TABLET | Freq: Every day | ORAL | 1 refills | Status: DC

## 2020-07-07 MED ORDER — TRIAMCINOLONE ACETONIDE 0.1 % EX CREA: TOPICAL_CREAM | CUTANEOUS | 1 refills | Status: DC

## 2020-07-07 NOTE — Telephone Encounter (Signed)
Pt had a hospital f/u on 06/02/20, pt called into triage requesting refill of meds. Will route to assistant for review

## 2020-07-07 NOTE — Chronic Care Management (AMB) (Signed)
Chronic Care Management   CCM RN Visit Note  07/07/2020 Name: Travis [name] MRN: [id number] DOB: 05-31-1955  Subjective: Travis [name] is a 65 y.o. year old male who is a primary care patient of [name], NP. The care management team was consulted for assistance with disease management and care coordination needs.    Engaged with patient by telephone for follow up visit in response to provider referral for case management and/or care coordination services.   Consent to Services:  The patient was given information about Chronic Care Management services, agreed to services, and gave verbal consent prior to initiation of services.  Please see initial visit note for detailed documentation.   Patient agreed to services and verbal consent obtained.   Assessment: Review of patient past medical history, allergies, medications, health status, including review of consultants reports, laboratory and other test data, was performed as part of comprehensive evaluation and provision of chronic care management services.   SDOH (Social Determinants of Health) assessments and interventions performed:    CCM Care Plan  No Known Allergies  Outpatient Encounter Medications as of 07/07/2020  Medication Sig Note  . albuterol (VENTOLIN HFA) 108 (90 Base) MCG/ACT inhaler Inhale 2 puffs into the lungs every 6 (six) hours as needed for wheezing or shortness of breath.   . allopurinol (ZYLOPRIM) 100 MG tablet Take 200 mg by mouth daily.   [name] aspirin 325 MG tablet Take 162.5 mg by mouth daily.   [name] atorvastatin (LIPITOR) 20 MG tablet Take 20 mg by mouth daily.   . carvedilol (COREG) 6.25 MG tablet Take 1 tablet (6.25 mg total) by mouth 2 (two) times daily.   . cetirizine (ZYRTEC) 10 MG tablet TAKE 1 TABLET BY MOUTH DAILY   . fenofibrate (TRICOR) 145 MG tablet TAKE 1 TABLET BY MOUTH ONCE A DAY   . ferrous sulfate 325 (65 FE) MG tablet Take 1 tablet (325 mg total) by mouth daily with breakfast.   .  fluticasone (FLONASE) 50 MCG/ACT nasal spray Place 1 spray into both nostrils 2 (two) times daily. 06/06/2020: Patient reports he uses as needed.   . hydroxypropyl methylcellulose / hypromellose (ISOPTO TEARS / GONIOVISC) 2.5 % ophthalmic solution as needed for dry eyes.   . magnesium oxide (MAG-OX) 400 MG tablet Take 1 tablet (400 mg total) by mouth daily.   . Multiple Vitamin (MULTIVITAMIN WITH MINERALS) TABS tablet Take 1 tablet by mouth daily. Men's One a Day   . Omega-3 Fatty Acids (FISH OIL PO) Take 1 capsule by mouth daily.   . pantoprazole (PROTONIX) 40 MG tablet Take 1 tablet (40 mg total) by mouth daily.   . sacubitril-valsartan (ENTRESTO) 24-26 MG Take 1 tablet by mouth 2 (two) times daily.   [name] triamcinolone (KENALOG) 0.1 % APPLY TO AFFECTED AREAS TWICE A DAY AS NEEDED. AVOID FACE,GROIN, OR UNDERARMS    No facility-administered encounter medications on file as of 07/07/2020.    Patient Active Problem List   Diagnosis Date Noted  . Protein-calorie malnutrition, severe 05/27/2020  . Hypotension 05/26/2020  . AKI (acute kidney injury) ([place]) 05/26/2020  . Alcohol use 11/20/2019  . CKD (chronic kidney disease), stage III ([place]) 11/20/2019  . TIA (transient ischemic attack) 11/08/2019  . Osteoarthritis 05/09/2016  . COPD (chronic obstructive pulmonary disease) ([place]) 05/09/2016  . Essential hypertension 04/18/2015  . Literacy level of illiterate 06/04/2014  . 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 Cardiomyopathy, nonischemic ([place]) 03/15/2013  . Chronic systolic heart failure ([place])  01/05/2011  . Tobacco use 01/05/2011  . Diabetes ([place]) 11/30/2008  . HLD (hyperlipidemia) 11/30/2008  . Gout 11/30/2008    Conditions to be addressed/monitored: CHF, Anemia  Care Plan : RNCM Heart Failure ( Adult)  Updates made by Dannielle Karvonen, RN since 07/07/2020 12:00 AM  Problem: Symptom Exacerbation (Heart Failure)   Priority: High  Long-Range Goal: Patient will minimize heart  failure exacerbation   Start Date: 04/27/2020  Expected End Date: 09/22/2020  This Visit's Progress: On track  Recent Progress: On track  Priority: High  Current Barriers:  [name] Knowledge deficit related to basic heart failure pathophysiology and self care management: Patient reports he has not weighed himself for a week.   . Knowledge Deficits related to heart failure medications . Literacy Barriers: patient reports his niece assists him with reading / understanding medical documentation. Patient's primary care provider is leaving the practice this month (April 2022)  Case Manager Clinical Goal(s):  [name] Patient will verbalize understanding of Heart Failure Action Plan and when to call doctor . Patient will weigh daily and record (notifying MD of 3 lb weight gain over night or 5 lb in a week) Interventions:  . Collaboration with [name], NP regarding development and update of comprehensive plan of care as evidenced by provider attestation and co-signature: Patient states his new primary care provider would start on August 24, 2020.  Patient unsure of the name of new provider. He reports the new doctor will be with the same provider office.  Bertram Savin care team collaboration (see longitudinal plan of care) . Basic overview and discussion of pathophysiology of Heart Failure reviewed  . Provided verbal education on low sodium diet . Reviewed Heart Failure Action Plan in depth. Heart failure action plan discussed with patient. Patient reports being in the [name] zone today and denying any swelling or shortness of breath. Patient states he has seasonal allergies and takes medication for this.  . Discussed importance of daily weight and advised patient to weigh and record daily:  Reinforced need for daily weights with patient. Patient verbalized understanding.  . - medication-adherence assessment completed:  Patient reports he is out of his eczema cream and zyrtec.  Patient states he was  prescribed magnesium when he was in the hospital but is unsure if he should continue to take it.   RNCM advised patient to contact his pharmacy to have eczema cream/ zyrtect refilled and advised patient to call his primary care provider office to ask if he should continue the magnesium. Patient verbalized understanding and agreement.  . - self-awareness of signs/symptoms of worsening disease encouraged:  Patients states, " I know to watch for swelling and if that starts I need to call my doctor." . Patient advised to scheduled COVID 19 booster:  Patient states he has not obtained his COVID booster yet.  Encouraged him to get it. Informed patient he can receive COVID booster at local drug stores such as [facility] or [facility].  . Smoking cessation discussed:  Encouraged patient to get over the counter smoking cessation patches as advised by his doctor.  Patient Goals/Self-Care Activities -  continue to take your medications as prescribed. Call you doctor regarding need for Magnesium and contact your pharmacy to refill your eczema medication and zyrtec.   Try to weigh daily and record (notifying MD of 3 lb weight gain over night or 5 lb in a week)  Continue to Review and familiarize yourself with the Heart failure Action Plan  Continue  to follow a low salt diet - eat more whole grains, fruits and vegetables, lean meats and healthy fats - follow rescue plan for symptoms flare-up - Continue to keep your follow up appointments with your doctors.  - Continue to watch for swelling in feet, ankles and legs every day - Schedule appointment for COVID 19 booster.  - Continue to work on decreasing the number of cigarettes per day. (Obtain  / use over the counter smoking patch as advised by your doctor)   Follow Up Plan: The patient has been provided with contact information for the care management team and has been advised to call with any health related questions or concerns.  The care management team will reach out  to the patient again over the next 45 days.     Care Plan : Anemia  Updates made by Dannielle Karvonen, RN since 07/07/2020 12:00 AM  Problem: Knowledge deficit related to long term health management of anemia   Priority: High  Long-Range Goal: Anemia Complications Prevented or Managed   Start Date: 06/06/2020  Expected End Date: 09/22/2020  This Visit's Progress: On track  Recent Progress: On track  Priority: High  Current Barriers:  [name] Knowledge deficits related to self health management of anemia or bleeding: Per chart review and patient oncologist wants patient to have a bone marrow biopsy due to ongoing macrocytic anemia. Patient wishes to hold off for now and have another follow up visit with the oncologist.  . Literacy barriers:  Patient reports his niece assists him with reading/ understanding medical documentation. Nurse Case Manager Clinical Goal(s):   patient will take all medications  as prescribed and will call provider for medication related questions  patient will verbalize basic understanding of anemia disease process and self health management plan. Interventions:  . Collaboration with [name], NP regarding development and update of comprehensive plan of care as evidenced by provider attestation and co-signature . Inter-disciplinary care team collaboration (see longitudinal plan of care) . Basic overview and discussion of anemia/bleeding disorder or acute disease state . Medications reviewed:  Patient reports he continues to take his iron pills as advised by his doctor.  . Encouraged to maintain sobriety:  Patient states his last alcoholic drink was on 0/08/2374. . encouraged optimal oral intake to support fluid balance and nutrition:  Patient states he continues to drink boost or ensure daily. He reports his appetite is better and states he has felt some improvement regarding the fatigue he has been experiencing.  Patient states, " I have picked up a little weight."  He  reports his weight approximately 1 week ago was 176 lbs.  . encouraged dietary changes to increase dietary intake of iron, Vitamin E83 and folic acid as advised/prescribed:  Patient reports having constipation due to iron medication. Advised client to increase his fruits, vegetables and water intake. Patient confirms he is not on a fluid restriction.  Also advised patient to try dried prunes if tolerable. Patient verbalized understanding.  . Increase your activity as tolerated and as advised by your doctor:  Patient states he feels the fatigue has gotten a little better. Patient reports he is walking at least 5 days per week approximately 1 1/2 mile.  . provided education about signs and symptoms of active bleeding and advised when to call provider or 911 . Advised to keep provider appointments as advised:   Patient states his last visit with the oncologist was 06/21/2020. Patient is scheduled to follow up on 07/19/2020  with the hematologist/ oncologist.  . Provided written education information on constipation. Patient Goals/Self-Care Activities: - Continue to eat iron rich foods such as: lean meats and fish; [place] leafy vegetables, such as kale and spinach; Leane Loring beans, eggs; dried fruits, such as dates and figs; broccoli. Increase your fruits, vegetables, and water to help with constipation.  Inquire with your doctor or pharmacist about over the counter stool softener or mild laxative.   - Continue to take your medications as prescribed.  - Follow up with Hematology specialist as advised. - Avoid alcohol - Have lab work done as directed by your doctor - Monitor for bleeding from nose, gums, in urine or stool to provider and report these symptoms to your doctor as soon as possible.   Follow Up Plan: The patient has been provided with contact information for the care management team and has been advised to call with any health related questions or concerns.  The care management team will reach out to  the patient again over the next 45 days.       Plan:The patient has been provided with contact information for the care management team and has been advised to call with any health related questions or concerns.  and The care management team will reach out to the patient again over the next 45 days. [name] RN,BSN,CCM RN Case Manager [place]  [phone]

## 2020-07-07 NOTE — Patient Instructions (Addendum)
Visit Information:  Thank you for taking the time to speak with me today.   PATIENT GOALS: Goals Addressed            This Visit's Progress   . Manage anemia   On track    Timeframe:  Long-Range Goal Priority:  High Start Date:  06/06/2020                           Expected End Date:   09/22/2020  Follow up:  08/12/2020  Patient goals:  - Continue to eat iron rich foods such as: lean meats and fish; Heberto Sturdevant leafy vegetables, such as kale and spinach; Derold Dorsch beans, eggs; dried fruits, such as dates and figs; broccoli. Increase your fruits, vegetables, and water to help with constipation.  Inquire with your doctor or pharmacist about over the counter stool softener or mild laxative.  Try prunes if tolerated.  - Continue to take your medications as prescribed.  - Follow up with Hematology specialist as advised. - Avoid alcohol - Have lab work done as directed by your doctor - Monitor for bleeding from nose, gums, in urine or stool to provider and report these symptoms to your doctor as soon as possible.  -Review information on your after visit summary for constipation.     . Track and Manage  heart failure Symptoms   On track    Timeframe:  Long-Range Goal Priority:  High Start Date: 04/27/2020                         Expected End Date:  09/22/2020                     Follow Up Date 08/12/2020   -  continue to take your medications as prescribed. Call you doctor regarding need for Magnesium and contact your pharmacy to refill your eczema medication and zyrtec.  - Try to weigh daily and record (notifying MD of 3 lb weight gain over night or 5 lb in a week) - Continue to Review and familiarize yourself with the Heart failure Action Plan - Continue to follow a low salt diet - eat more whole grains, fruits and vegetables, lean meats and healthy fats - follow rescue plan for symptoms flare-up - Continue to keep your follow up appointments with your doctors.  - Continue to watch for swelling  in feet, ankles and legs every day - Schedule appointment for COVID 19 booster.  - Continue to work on decreasing the number of cigarettes per day. (Obtain  / use over the counter smoking patch as advised by your doctor)    Why is this important?   You will be able to handle your symptoms better if you keep track of them.  Making some simple changes to your lifestyle will help.  Eating healthy is one thing you can do to take good care of yourself.             Heart Failure Action Plan A heart failure action plan helps you understand what to do when you have symptoms of heart failure. Your action plan is a color-coded plan that lists the symptoms to watch for and indicates what actions to take.  If you have symptoms in the red zone, you need medical care right away.  If you have symptoms in the yellow zone, you are having problems.  If you have symptoms in the Cindy Brindisi  zone, you are doing well. Follow the plan that was created by you and your health care provider. Review your plan each time you visit your health care provider. Red zone These signs and symptoms mean you should get medical help right away:  You have trouble breathing when resting.  You have a dry cough that is getting worse.  You have swelling or pain in your legs or abdomen that is getting worse.  You suddenly gain more than 2-3 lb (0.9-1.4 kg) in 24 hours, or more than 5 lb (2.3 kg) in a week. This amount may be more or less depending on your condition.  You have trouble staying awake or you feel confused.  You have chest pain.  You do not have an appetite.  You pass out.  You have worsening sadness or depression. If you have any of these symptoms, call your local emergency services (911 in the [place] right away. Do not drive yourself to the hospital.   Yellow zone These signs and symptoms mean your condition may be getting worse and you should make some changes:  You have trouble breathing when you are  active, or you need to sleep with your head raised on extra pillows to help you breathe.  You have swelling in your legs or abdomen.  You gain 2-3 lb (0.9-1.4 kg) in 24 hours, or 5 lb (2.3 kg) in a week. This amount may be more or less depending on your condition.  You get tired easily.  You have trouble sleeping.  You have a dry cough. If you have any of these symptoms:  Contact your health care provider within the next day.  Your health care provider may adjust your medicines.   Mariany Mackintosh zone These signs mean you are doing well and can continue what you are doing:  You do not have shortness of breath.  You have very little swelling or no new swelling.  Your weight is stable (no gain or loss).  You have a normal activity level.  You do not have chest pain or any other new symptoms.   Follow these instructions at home:  Take over-the-counter and prescription medicines only as told by your health care provider.  Weigh yourself daily. Your target weight is __________ lb (__________ kg). ? Call your health care provider if you gain more than __________ lb (__________ kg) in 24 hours, or more than __________ lb (__________ kg) in a week. ? Health care provider name: _____________________________________________________ ? Health care provider phone number: _____________________________________________________  Eat a heart-healthy diet. Work with a diet and nutrition specialist (dietitian) to create an eating plan that is best for you.  Keep all follow-up visits. This is important. Where to find more information  American Heart Association: www.heart.org Summary  A heart failure action plan helps you understand what to do when you have symptoms of heart failure.  Follow the action plan that was created by you and your health care provider.  Get help right away if you have any symptoms in the red zone. This information is not intended to replace advice given to you by your  health care provider. Make sure you discuss any questions you have with your health care provider. Document Revised: 10/26/2019 Document Reviewed: 10/26/2019 Elsevier Patient Education  2021 [place].  Constipation, Adult Constipation is when a person has trouble pooping (having a bowel movement). When you have this condition, you may poop fewer than 3 times a week. Your poop (stool) may also be  dry, hard, or bigger than normal. Follow these instructions at home: Eating and drinking  Eat foods that have a lot of fiber, such as: ? Fresh fruits and vegetables. ? Whole grains. ? Beans.  Eat less of foods that are low in fiber and high in fat and sugar, such as: ? [place] fries. ? Hamburgers. ? Cookies. ? Candy. ? Soda.  Drink enough fluid to keep your pee (urine) pale yellow.   General instructions  Exercise regularly or as told by your doctor. Try to do 150 minutes of exercise each week.  Go to the restroom when you feel like you need to poop. Do not hold it in.  Take over-the-counter and prescription medicines only as told by your doctor. These include any fiber supplements.  When you poop: ? Do deep breathing while relaxing your lower belly (abdomen). ? Relax your pelvic floor. The pelvic floor is a group of muscles that support the rectum, bladder, and intestines (as well as the uterus in women).  Watch your condition for any changes. Tell your doctor if you notice any.  Keep all follow-up visits as told by your doctor. This is important. Contact a doctor if:  You have pain that gets worse.  You have a fever.  You have not pooped for 4 days.  You vomit.  You are not hungry.  You lose weight.  You are bleeding from the opening of the butt (anus).  You have thin, pencil-like poop. Get help right away if:  You have a fever, and your symptoms suddenly get worse.  You leak poop or have blood in your poop.  Your belly feels hard or bigger than normal  (bloated).  You have very bad belly pain.  You feel dizzy or you faint. Summary  Constipation is when a person poops fewer than 3 times a week, has trouble pooping, or has poop that is dry, hard, or bigger than normal.  Eat foods that have a lot of fiber.  Drink enough fluid to keep your pee (urine) pale yellow.  Take over-the-counter and prescription medicines only as told by your doctor. These include any fiber supplements. This information is not intended to replace advice given to you by your health care provider. Make sure you discuss any questions you have with your health care provider. Document Revised: 01/28/2019 Document Reviewed: 01/28/2019 Elsevier Patient Education  2021 [employer].   The patient verbalized understanding of instructions, educational materials, and care plan provided today and agreed to receive a mailed copy of patient instructions, educational materials, and care plan.   The patient has been provided with contact information for the care management team and has been advised to call with any health related questions or concerns.  The care management team will reach out to the patient again over the next 45 days.   [name] RN,BSN,CCM RN Case Manager [place]  [phone]     .

## 2020-07-19 ENCOUNTER — Ambulatory Visit: Payer: Medicare HMO | Admitting: Cardiovascular Disease

## 2020-07-19 ENCOUNTER — Encounter: Payer: Self-pay | Admitting: Cardiovascular Disease

## 2020-07-19 ENCOUNTER — Other Ambulatory Visit: Payer: Self-pay

## 2020-07-19 VITALS — BP 128/70 | HR 90 | Ht 76.0 in | Wt 185.0 lb

## 2020-07-19 DIAGNOSIS — Z720 Tobacco use: Secondary | ICD-10-CM | POA: Diagnosis not present

## 2020-07-19 DIAGNOSIS — I5022 Chronic systolic (congestive) heart failure: Secondary | ICD-10-CM | POA: Diagnosis not present

## 2020-07-19 DIAGNOSIS — I10 Essential (primary) hypertension: Secondary | ICD-10-CM | POA: Diagnosis not present

## 2020-07-19 DIAGNOSIS — E785 Hyperlipidemia, unspecified: Secondary | ICD-10-CM

## 2020-07-19 NOTE — Progress Notes (Signed)
Cardiology Office Note   Date:  07/19/2020   ID:  Travis [name], DOB 1955/04/14, MRN [id number]  PCP:  [name], NP  Cardiologist:   Kathlyn Sacramento, MD   Chief Complaint  Patient presents with  . Other    4 wk f/u no complaints today. Meds reviewed verbally with pt.      History of Present Illness: Travis [name] is a 65 y.o. 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 He has known history of mild nonobstructive one-vessel coronary artery disease, history of TIA, tobacco use, previous excessive alcohol use and hyperlipidemia. Echocardiogram in November 2016 showed worsening LV systolic dysfunction with an ejection fraction of 25-30%.  Cardiac catheterization showed mild nonobstructive one-vessel coronary artery disease. LVEDP was mildly elevated.   He was hospitalized in August, 2021 with transient vision loss and dizziness.  Stroke work-up was negative.  He was noted to be anemic with a hemoglobin of 9.  Negative occult blood.  He was also noted to have acute renal failure with a creatinine of 3.29.  Renal function improved with hydration.  However, he was still discharged home with a creatinine of 2.4 and was sent home on the same dose of Entresto and spironolactone. He was rehospitalized the end of August with dizziness and blurred vision and was found to be febrile and hypotensive.  His hemoglobin was down to 6.6 and he was transfused.  Anemia work-up revealed large grade a reflux esophagitis.  The patient was taken off Entresto due to hypotension.  His fever was of unknown origin.  His anemia subsequently improved and most recent CBC showed a hemoglobin of 11.1.  In addition, renal function was back to baseline . Echocardiogram during hospitalization showed an EF of 45 to 50%.  He was seen in March with dizziness and hypotension.  His blood pressure was 74/50.  He was sent to the  ED.  He was noted to have acute on chronic kidney injury with creatinine of 2.37.  He improved with hydration.  Entresto and spironolactone were held in the setting of hypotension.  Subsequently, [name] was resumed.  He stopped drinking alcohol again and has been doing well with no recent chest pain, shortness of breath or palpitations.  Past Medical History:  Diagnosis Date  . Alcohol abuse   . 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 05/2019 Echo: EF 35-40%; b. 10/2019 Echo: EF 45-50%.  . Allergy   . Asthma   . 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 Chronic anemia   . Clotting disorder ([place])   . Diabetes mellitus, type 2 ([place])    pt reports his DM is gone  . GI bleed    15 years ago  . Gout   . Hemoptysis    secondary to pulmonary edema  . HFimpEF (heart failure with improved ejection fraction) ([place])    a. 12/2010 Echo: EF 20-25%; b 08/2013 Echo: EF 45-50%; c. 01/2015 Echo: EF 25-30%; d. 07/2016 Echo: EF 35-40%; e. 05/2019 Echo: EF 35-40%; d. 10/2019 Echo: EF 45-50%, mild LVH, mild red RV fxn, mildl dil RA, Triv MR. Mild to mod Ao Sclerosis w/o stenosis.  . Hyperlipidemia   . Hypertension   . Nonischemic cardiomyopathy ([place])   . Nonobstructive Coronary artery disease    a. 01/2015 Cath: LM nl, LAD 30p/m, LCX nl, RCA 10p/m, RPDA min irregs.  . Osteoarthritis   .  Pneumonia   . TIA (transient ischemic attack)   . Tobacco abuse   . Vision loss    peripherial vision only left eye.Central Retinal  artery occusion     Past Surgical History:  Procedure Laterality Date  . BIOPSY  11/25/2019   Procedure: BIOPSY;  Surgeon: Ladene Artist, MD;  Location: [facility];  Service: Endoscopy;;  . CARDIAC CATHETERIZATION    . CARDIAC CATHETERIZATION N/A 01/28/2015   Procedure: Left Heart Cath and Coronary Angiography;  Surgeon: Wellington Hampshire, MD;  Location: [facility];  Service: Cardiovascular;  Laterality: N/A;  . COLONOSCOPY    . ESOPHAGOGASTRODUODENOSCOPY  (EGD) WITH PROPOFOL N/A 11/25/2019   Procedure: ESOPHAGOGASTRODUODENOSCOPY (EGD) WITH PROPOFOL;  Surgeon: Ladene Artist, MD;  [facility];  Service: Endoscopy;  Laterality: N/A;  . HIP ARTHROPLASTY Right 03/15/2013   Procedure: ARTHROPLASTY BIPOLAR HIP;  Surgeon: [name], MD;  Location: [facility];  Service: Orthopedics;  Laterality: Right;  . TOTAL SHOULDER ARTHROPLASTY Right 09/01/2015   Procedure: RIGHT TOTAL SHOULDER ARTHROPLASTY;  Surgeon: [name], MD;  Location: [facility];  Service: Orthopedics;  Laterality: Right;     Current Outpatient Medications  Medication Sig Dispense Refill  . albuterol (VENTOLIN HFA) 108 (90 Base) MCG/ACT inhaler Inhale 2 puffs into the lungs every 6 (six) hours as needed for wheezing or shortness of breath.    . allopurinol (ZYLOPRIM) 100 MG tablet Take 200 mg by mouth daily.    [name] aspirin 325 MG tablet Take 162.5 mg by mouth daily.    [name] atorvastatin (LIPITOR) 20 MG tablet Take 20 mg by mouth daily.    . carvedilol (COREG) 6.25 MG tablet Take 1 tablet (6.25 mg total) by mouth 2 (two) times daily. 180 tablet 3  . cetirizine (ZYRTEC) 10 MG tablet Take 1 tablet (10 mg total) by mouth daily. 90 tablet 1  . fenofibrate (TRICOR) 145 MG tablet TAKE 1 TABLET BY MOUTH ONCE A DAY 90 tablet 0  . ferrous sulfate 325 (65 FE) MG tablet Take 1 tablet (325 mg total) by mouth daily with breakfast. 90 tablet 2  . fluticasone (FLONASE) 50 MCG/ACT nasal spray Place into both nostrils as needed for allergies or rhinitis.    . hydroxypropyl methylcellulose / hypromellose (ISOPTO TEARS / GONIOVISC) 2.5 % ophthalmic solution as needed for dry eyes.    . magnesium oxide (MAG-OX) 400 MG tablet Take 1 tablet (400 mg total) by mouth daily. 30 tablet 1  . Multiple Vitamin (MULTIVITAMIN WITH MINERALS) TABS tablet Take 1 tablet by mouth daily. Men's One a Day    . Omega-3 Fatty Acids (FISH OIL PO) Take 1 capsule by mouth daily.    . pantoprazole (PROTONIX) 40 MG tablet  Take 1 tablet (40 mg total) by mouth daily. 90 tablet 2  . sacubitril-valsartan (ENTRESTO) 24-26 MG Take 1 tablet by mouth 2 (two) times daily. 60 tablet 6  . triamcinolone cream (KENALOG) 0.1 % APPLY TO AFFECTED AREAS TWICE A DAY AS NEEDED. AVOID FACE,GROIN, OR UNDERARMS 454 g 1   No current facility-administered medications for this visit.    Allergies:   Patient has no known allergies.    Social History:  The patient  reports that he has been smoking cigarettes. He has a 15.00 pack-year smoking history. He has never used smokeless tobacco. He reports previous alcohol use of about 1.0 standard drink of alcohol per week. He reports current drug use. Drug: Marijuana.   Family History:  The patient's  family history includes Dementia in his brother; Diabetes in his father, mother, and sister; Hypertension in his father and mother.    ROS:  Please see the history of present illness.   Otherwise, review of systems are positive for none.   All other systems are reviewed and negative.    PHYSICAL EXAM: VS:  BP 128/70 (BP Location: Left Arm, Patient Position: Sitting, Cuff Size: Normal)   Pulse 90   Ht '6\' 4"'$  (1.93 m)   Wt 185 lb (83.9 kg)   SpO2 98%   BMI 22.52 kg/m  , BMI Body mass index is 22.52 kg/m. GEN: Well nourished, well developed, he seems in mild distress due to discomfort. HEENT: normal  Neck: no JVD, carotid bruits, or masses Cardiac: RRR; no murmurs, rubs, or gallops,no edema  Respiratory:  clear to auscultation bilaterally, normal work of breathing GI: soft, nontender, nondistended, + BS MS: no deformity or atrophy  Skin: warm and dry, no rash Neuro:  Strength and sensation are intact Psych: euthymic mood, full affect   EKG:  EKG is  ordered today. EKG showed normal sinus rhythm with left axis deviation.    Recent Labs: 11/20/2019: B Natriuretic Peptide 365.0 05/27/2020: TSH 0.392 05/28/2020: Magnesium 1.5 06/02/2020: Pro B Natriuretic peptide (BNP) 61.0 06/06/2020:  ALT 40; Hemoglobin 8.4; Platelets 285 06/21/2020: BUN 21; Creatinine, Ser 1.26; Potassium 4.4; Sodium 138    Lipid Panel    Component Value Date/Time   CHOL 150 11/09/2019 0911   CHOL 138 06/18/2017 1425   TRIG 77 11/09/2019 0911   HDL 47 11/09/2019 0911   HDL 67 06/18/2017 1425   CHOLHDL 3.2 11/09/2019 0911   VLDL 15 11/09/2019 0911   LDLCALC 88 11/09/2019 0911   LDLCALC 51 06/18/2017 1425   LDLDIRECT 69.0 12/30/2018 1232      Wt Readings from Last 3 Encounters:  07/19/20 185 lb (83.9 kg)  06/21/20 178 lb 1.6 oz (80.8 kg)  06/20/20 177 lb 2 oz (80.3 kg)       ASSESSMENT AND PLAN: MN  1.   Chronic systolic heart failure: The patient seems to be doing much better since he stopped drinking alcohol.  He appears to be euvolemic and his blood pressure is normal.  Recommend continuing treatment with carvedilol and Entresto.  Most recent ejection fraction was 45 to 50%.  I am not planning to resume spironolactone given his tendency to develop volume depletion with multiple presentations with acute on chronic kidney disease.   We can consider adding an SGLT2 inhibitor in the near future.  2. Essential hypertension: Blood pressure is well controlled.  3. Tobacco use: He continues to smoke and has not been able to quit smoking.   4.  Hyperlipidemia: Currently on atorvastatin 20 mg daily.  Most recent lipid profile showed an LDL of 88.  5.  Alcohol use: He quit drinking recently.     Disposition: Follow-up in 4 months  Signed,  Kathlyn Sacramento, MD  07/19/2020 3:31 PM    [facility]

## 2020-07-19 NOTE — Patient Instructions (Signed)
Medication Instructions:  Your physician recommends that you continue on your current medications as directed. Please refer to the Current Medication list given to you today.  *If you need a refill on your cardiac medications before your next appointment, please call your pharmacy*   Lab Work: None ordered If you have labs (blood work) drawn today and your tests are completely normal, you will receive your results only by: [name] MyChart Message (if you have MyChart) OR . A paper copy in the mail If you have any lab test that is abnormal or we need to change your treatment, we will call you to review the results.   Testing/Procedures: None ordered   Follow-Up: At [facility], you and your health needs are our priority.  As part of our continuing mission to provide you with exceptional heart care, we have created designated Provider Care Teams.  These Care Teams include your primary Cardiologist (physician) and Advanced Practice Providers (APPs -  Physician Assistants and Nurse Practitioners) who all work together to provide you with the care you need, when you need it.  We recommend signing up for the patient portal called "MyChart".  Sign up information is provided on this After Visit Summary.  MyChart is used to connect with patients for Virtual Visits (Telemedicine).  Patients are able to view lab/test results, encounter notes, upcoming appointments, etc.  Non-urgent messages can be sent to your provider as well.   To learn more about what you can do with MyChart, go to [email or web address].    Your next appointment:   4 month(s)  The format for your next appointment:   In Person  Provider:   You may see Kathlyn Sacramento, MD or one of the following Advanced Practice Providers on your designated Care Team:    Murray Hodgkins, NP  Christell Faith, PA-C  Marrianne Mood, PA-C  Cadence Staves, [place]  Laurann Montana, NP    Other Instructions N/A

## 2020-07-22 ENCOUNTER — Inpatient Hospital Stay: Payer: Medicare HMO | Attending: Oncology

## 2020-08-12 ENCOUNTER — Ambulatory Visit (INDEPENDENT_AMBULATORY_CARE_PROVIDER_SITE_OTHER): Payer: Medicare HMO

## 2020-08-12 DIAGNOSIS — I5022 Chronic systolic (congestive) heart failure: Secondary | ICD-10-CM | POA: Diagnosis not present

## 2020-08-12 DIAGNOSIS — D509 Iron deficiency anemia, unspecified: Secondary | ICD-10-CM | POA: Diagnosis not present

## 2020-08-12 NOTE — Patient Instructions (Signed)
Visit Information:  Thank you for taking the time to speak with me today.   PATIENT GOALS: Goals Addressed            This Visit's Progress   . Manage anemia   On track    Timeframe:  Long-Range Goal Priority:  High Start Date:  06/06/2020                           Expected End Date:   11/23/2020  Follow up:  09/15/2020  Patient goals:  - Continue to eat iron rich foods such as: lean meats and fish; Vergia Chea leafy vegetables, such as kale and spinach; Kali [place] beans, eggs; dried fruits, such as dates and figs; broccoli. Increase your fruits, vegetables, and water to help with constipation.  Inquire with your doctor or pharmacist about over the counter stool softener or mild laxative.   - Continue to take your medications as prescribed. Refill your medications timely.  - Contact your doctor for new or concerning symptoms.  - Follow up with Hematology specialist as advised.  - Keep follow up appointment with new primary care provider. Appointment is scheduled for 09/13/20 at 10:30am.  - Continue to Avoid alcohol - Monitor for bleeding from nose, gums, in urine or stool to provider and report these symptoms to your doctor as soon as possible.      . Track and Manage  heart failure Symptoms   On track    Timeframe:  Long-Range Goal Priority:  High Start Date: 04/27/2020                         Expected End Date:  11/23/2020                   Follow Up Date 09/15/2020   -  continue to take your medications as prescribed. Refill your prescriptions timely. - Continue to weigh daily and record (notifying MD of 3 lb weight gain over night or 5 lb in a week) - Continue to follow a low salt diet - Continue to eat more whole grains, fruits and vegetables, lean meats and healthy fats - follow rescue plan for symptoms flare-up - Continue to keep your follow up appointments with your doctors. New primary care provider appointment with Rulon Abide, NP on 09/13/2020 at 10:30am - Continue to watch for  swelling in feet, ankles and legs every day. (Report these symptoms to your doctor) - Schedule appointment for COVID 19 booster.  - Continue to work on decreasing the number of cigarettes per day. (Obtain  / use over the counter smoking patch as advised by your doctor)    Why is this important?   You will be able to handle your symptoms better if you keep track of them.  Making some simple changes to your lifestyle will help.  Eating healthy is one thing you can do to take good care of yourself.            Heart Failure Action Plan A heart failure action plan helps you understand what to do when you have symptoms of heart failure. Your action plan is a color-coded plan that lists the symptoms to watch for and indicates what actions to take.  If you have symptoms in the red zone, you need medical care right away.  If you have symptoms in the yellow zone, you are having problems.  If you have symptoms in the  Kanishk Stroebel zone, you are doing well. Follow the plan that was created by you and your health care provider. Review your plan each time you visit your health care provider. Red zone These signs and symptoms mean you should get medical help right away:  You have trouble breathing when resting.  You have a dry cough that is getting worse.  You have swelling or pain in your legs or abdomen that is getting worse.  You suddenly gain more than 2-3 lb (0.9-1.4 kg) in 24 hours, or more than 5 lb (2.3 kg) in a week. This amount may be more or less depending on your condition.  You have trouble staying awake or you feel confused.  You have chest pain.  You do not have an appetite.  You pass out.  You have worsening sadness or depression. If you have any of these symptoms, call your local emergency services (911 in the [place] right away. Do not drive yourself to the hospital.   Yellow zone These signs and symptoms mean your condition may be getting worse and you should make some  changes:  You have trouble breathing when you are active, or you need to sleep with your head raised on extra pillows to help you breathe.  You have swelling in your legs or abdomen.  You gain 2-3 lb (0.9-1.4 kg) in 24 hours, or 5 lb (2.3 kg) in a week. This amount may be more or less depending on your condition.  You get tired easily.  You have trouble sleeping.  You have a dry cough. If you have any of these symptoms:  Contact your health care provider within the next day.  Your health care provider may adjust your medicines.   Lateisha Thurlow zone These signs mean you are doing well and can continue what you are doing:  You do not have shortness of breath.  You have very little swelling or no new swelling.  Your weight is stable (no gain or loss).  You have a normal activity level.  You do not have chest pain or any other new symptoms.   Follow these instructions at home:  Take over-the-counter and prescription medicines only as told by your health care provider.  Weigh yourself daily. Your target weight is __________ lb (__________ kg). ? Call your health care provider if you gain more than __________ lb (__________ kg) in 24 hours, or more than __________ lb (__________ kg) in a week. ? Health care provider name: _____________________________________________________ ? Health care provider phone number: _____________________________________________________  Eat a heart-healthy diet. Work with a diet and nutrition specialist (dietitian) to create an eating plan that is best for you.  Keep all follow-up visits. This is important. Where to find more information  American Heart Association: www.heart.org Summary  A heart failure action plan helps you understand what to do when you have symptoms of heart failure.  Follow the action plan that was created by you and your health care provider.  Get help right away if you have any symptoms in the red zone. This information is not  intended to replace advice given to you by your health care provider. Make sure you discuss any questions you have with your health care provider. Document Revised: 10/26/2019 Document Reviewed: 10/26/2019 Elsevier Patient Education  2021 [place] of Quitting Smoking Quitting smoking is a physical and mental challenge. You will face cravings, withdrawal symptoms, and temptation. Before quitting, work with your health care provider to make a  plan that can help you manage quitting. Preparation can help you quit and keep you from giving in. How to manage lifestyle changes Managing stress Stress can make you want to smoke, and wanting to smoke may cause stress. It is important to find ways to manage your stress. You might try some of the following:  Practice relaxation techniques. ? Breathe slowly and deeply, in through your nose and out through your mouth. ? Listen to music. ? Soak in a bath or take a shower. ? Imagine a peaceful place or vacation.  Get some support. ? Talk with family or friends about your stress. ? Join a support group. ? Talk with a counselor or therapist.  Get some physical activity. ? Go for a walk, run, or bike ride. ? Play a favorite sport. ? Practice yoga.   Medicines Talk with your health care provider about medicines that might help you deal with cravings and make quitting easier for you. Relationships Social situations can be difficult when you are quitting smoking. To manage this, you can:  Avoid parties and other social situations where people might be smoking.  Avoid alcohol.  Leave right away if you have the urge to smoke.  Explain to your family and friends that you are quitting smoking. Ask for support and let them know you might be a bit grumpy.  Plan activities where smoking is not an option. General instructions Be aware that many people gain weight after they quit smoking. However, not everyone does. To keep from  gaining weight, have a plan in place before you quit and stick to the plan after you quit. Your plan should include:  Having healthy snacks. When you have a craving, it may help to: ? Eat popcorn, carrots, celery, or other cut vegetables. ? Chew sugar-free gum.  Changing how you eat. ? Eat small portion sizes at meals. ? Eat 4-6 small meals throughout the day instead of 1-2 large meals a day. ? Be mindful when you eat. Do not watch television or do other things that might distract you as you eat.  Exercising regularly. ? Make time to exercise each day. If you do not have time for a long workout, do short bouts of exercise for 5-10 minutes several times a day. ? Do some form of strengthening exercise, such as weight lifting. ? Do some exercise that gets your heart beating and causes you to breathe deeply, such as walking fast, running, swimming, or biking. This is very important.  Drinking plenty of water or other low-calorie or no-calorie drinks. Drink 6-8 glasses of water daily.   How to recognize withdrawal symptoms Your body and mind may experience discomfort as you try to get used to not having nicotine in your system. These effects are called withdrawal symptoms. They may include:  Feeling hungrier than normal.  Having trouble concentrating.  Feeling irritable or restless.  Having trouble sleeping.  Feeling depressed.  Craving a cigarette. To manage withdrawal symptoms:  Avoid places, people, and activities that trigger your cravings.  Remember why you want to quit.  Get plenty of sleep.  Avoid coffee and other caffeinated drinks. These may worsen some of your symptoms. These symptoms may surprise you. But be assured that they are normal to have when quitting smoking. How to manage cravings Come up with a plan for how to deal with your cravings. The plan should include the following:  A definition of the specific situation you want to deal with.  An alternative  action you will take.  A clear idea for how this action will help.  The name of someone who might help you with this. Cravings usually last for 5-10 minutes. Consider taking the following actions to help you with your plan to deal with cravings:  Keep your mouth busy. ? Chew sugar-free gum. ? Suck on hard candies or a straw. ? Brush your teeth.  Keep your hands and body busy. ? Change to a different activity right away. ? Squeeze or play with a ball. ? Do an activity or a hobby, such as making bead jewelry, practicing needlepoint, or working with wood. ? Mix up your normal routine. ? Take a short exercise break. Go for a quick walk or run up and down stairs.  Focus on doing something kind or helpful for someone else.  Call a friend or family member to talk during a craving.  Join a support group.  Contact a quitline. Where to find support To get help or find a support group:  Call the [facility]'s Smoking Quitline: 1-800-QUIT NOW 952-405-3560)  Visit the website of the Substance Abuse and [place]: ktimeonline.com  Text QUIT to SmokefreeTXTAZ:4618977 Where to find more information Visit these websites to find more information on quitting smoking:  [place]: www.smokefree.gov  American Lung Association: www.lung.org  American Cancer Society: www.cancer.org  Centers for Disease Control and Prevention: http://www.wolf.info/  American Heart Association: www.heart.org Contact a health care provider if:  You want to change your plan for quitting.  The medicines you are taking are not helping.  Your eating feels out of control or you cannot sleep. Get help right away if:  You feel depressed or become very anxious. Summary  Quitting smoking is a physical and mental challenge. You will face cravings, withdrawal symptoms, and temptation to smoke again. Preparation can help you as you go through these challenges.  Try  different techniques to manage stress, handle social situations, and prevent weight gain.  You can deal with cravings by keeping your mouth busy (such as by chewing gum), keeping your hands and body busy, calling family or friends, or contacting a quitline for people who want to quit smoking.  You can deal with withdrawal symptoms by avoiding places where people smoke, getting plenty of rest, and avoiding drinks with caffeine. This information is not intended to replace advice given to you by your health care provider. Make sure you discuss any questions you have with your health care provider. Document Revised: 12/30/2018 Document Reviewed: 12/30/2018 Elsevier Patient Education  [place].   Patient verbalizes understanding of instructions provided today and agrees to view in [place].   The patient has been provided with contact information for the care management team and has been advised to call with any health related questions or concerns.  The care management team will reach out to the patient again over the next 45 days.  [name] RN,BSN,CCM RN Case Manager [place]  [phone]

## 2020-08-12 NOTE — Chronic Care Management (AMB) (Signed)
Chronic Care Management   CCM RN Visit Note  08/12/2020 Name: Travis [name] MRN: [id number] DOB: 1955-12-15  Subjective: Travis [name] is a 65 y.o. year old male who is a primary care patient of [name], NP. The care management team was consulted for assistance with disease management and care coordination needs.    Engaged with patient by telephone for follow up visit in response to provider referral for case management and/or care coordination services.   Consent to Services:  The patient was given information about Chronic Care Management services, agreed to services, and gave verbal consent prior to initiation of services.  Please see initial visit note for detailed documentation.   Patient agreed to services and verbal consent obtained.   Assessment: Review of patient past medical history, allergies, medications, health status, including review of consultants reports, laboratory and other test data, was performed as part of comprehensive evaluation and provision of chronic care management services.   SDOH (Social Determinants of Health) assessments and interventions performed:    CCM Care Plan  No Known Allergies  Outpatient Encounter Medications as of 08/12/2020  Medication Sig  . albuterol (VENTOLIN HFA) 108 (90 Base) MCG/ACT inhaler Inhale 2 puffs into the lungs every 6 (six) hours as needed for wheezing or shortness of breath.  . allopurinol (ZYLOPRIM) 100 MG tablet Take 200 mg by mouth daily.  [name] aspirin 325 MG tablet Take 162.5 mg by mouth daily.  [name] atorvastatin (LIPITOR) 20 MG tablet Take 20 mg by mouth daily.  . carvedilol (COREG) 6.25 MG tablet Take 1 tablet (6.25 mg total) by mouth 2 (two) times daily.  . cetirizine (ZYRTEC) 10 MG tablet Take 1 tablet (10 mg total) by mouth daily.  . fenofibrate (TRICOR) 145 MG tablet TAKE 1 TABLET BY MOUTH ONCE A DAY  . ferrous sulfate 325 (65 FE) MG tablet Take 1 tablet (325 mg total) by mouth daily with breakfast.  .  fluticasone (FLONASE) 50 MCG/ACT nasal spray Place into both nostrils as needed for allergies or rhinitis.  . hydroxypropyl methylcellulose / hypromellose (ISOPTO TEARS / GONIOVISC) 2.5 % ophthalmic solution as needed for dry eyes.  . magnesium oxide (MAG-OX) 400 MG tablet Take 1 tablet (400 mg total) by mouth daily.  . Multiple Vitamin (MULTIVITAMIN WITH MINERALS) TABS tablet Take 1 tablet by mouth daily. Men's One a Day  . Omega-3 Fatty Acids (FISH OIL PO) Take 1 capsule by mouth daily.  . pantoprazole (PROTONIX) 40 MG tablet Take 1 tablet (40 mg total) by mouth daily.  . sacubitril-valsartan (ENTRESTO) 24-26 MG Take 1 tablet by mouth 2 (two) times daily.  [name] triamcinolone cream (KENALOG) 0.1 % APPLY TO AFFECTED AREAS TWICE A DAY AS NEEDED. AVOID FACE,GROIN, OR UNDERARMS   No facility-administered encounter medications on file as of 08/12/2020.    Patient Active Problem List   Diagnosis Date Noted  . Protein-calorie malnutrition, severe 05/27/2020  . Hypotension 05/26/2020  . AKI (acute kidney injury) ([place]) 05/26/2020  . Alcohol use 11/20/2019  . CKD (chronic kidney disease), stage III ([place]) 11/20/2019  . TIA (transient ischemic attack) 11/08/2019  . Osteoarthritis 05/09/2016  . COPD (chronic obstructive pulmonary disease) ([place]) 05/09/2016  . Essential hypertension 04/18/2015  . Literacy level of illiterate 06/04/2014  . 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 Cardiomyopathy, nonischemic ([place]) 03/15/2013  . Chronic systolic heart failure ([place]) 01/05/2011  . Tobacco use 01/05/2011  . Diabetes ([place]) 11/30/2008  . HLD (hyperlipidemia) 11/30/2008  . Gout 11/30/2008  Conditions to be addressed/monitored:CHF and Anemia  Care Plan : RNCM Heart Failure ( Adult)  Updates made by Dannielle Karvonen, RN since 08/12/2020 12:00 AM  Problem: Symptom Exacerbation (Heart Failure)   Priority: High  Long-Range Goal: Patient will minimize heart failure exacerbation   Start Date:  04/27/2020  Expected End Date: 11/23/2020  This Visit's Progress: On track  Recent Progress: On track  Priority: High  Current Barriers: Patient denies any symptoms of shortness of breath, swelling or weight gain. He reports today's weight is 186.2.  Patient states he is consistently weighing daily. Patient states he continues to maintain his appetite and supplements with Ensure or Boost.  Patient states he does not have his new primary care provider since his previous provider left the practice. He states his understanding is that someone from his previous provider office will call him on August 24, 2020.   [name] Knowledge deficit related to basic heart failure pathophysiology and self care management:   . Literacy Barriers: patient reports his niece assists him with reading / understanding medical documentation.  . Case Manager Clinical Goal(s):  [name] Patient will verbalize understanding of Heart Failure Action Plan and when to call doctor . Patient will weigh daily and record (notifying MD of 3 lb weight gain over night or 5 lb in a week) Interventions:  . Collaboration with [name], NP regarding development and update of comprehensive plan of care as evidenced by provider attestation and co-signature: [name], NP, no longer patients primary care provider. RNCM called previous provider office and spoke with [facility] who stated patient has a new provider visit with Rulon Abide, NP on 09/13/20.  Patient notified of time and date of new primary provider visit by [facility].   [name] Inter-disciplinary care team collaboration (see longitudinal plan of care) . Basic overview and discussion of pathophysiology of Heart Failure reviewed  . Provided verbal education on low sodium diet . Reviewed Heart Failure Action Plan in depth. Heart failure action plan reviewed with patient. Patient reports being in the Jas[place]mine Navejas zone today and denying any symptoms.  . Discussed importance of daily weight and advised patient to  weigh and record daily:  . medication-adherence assessment completed:   Medications reviewed with patient. Patient reports having all of his medications and reports taking them as prescribed.  . self-awareness of signs/symptoms of worsening disease encouraged:   . Reviewed provider appointments with patient. Next follow up visits with cardiology:  10/03/2020 and 11/18/2020.  [name] Patient advised to scheduled COVID 19 booster:  Re discussed  and encouraged patient to get COVID booster. Patient verbalized understanding.  . Smoking cessation discussed:  Patient reports he is smoking 1 pk per day.  Discussed with patient smoking cessation strategies. Re-discussed previous primary care provider recommendation to obtain over the counter smoking cessation patches. Patient verbalized understanding.   Patient Goals/Self-Care Activities -  continue to take your medications as prescribed. Refill your prescriptions timely. - Continue to weigh daily and record (notifying MD of 3 lb weight gain over night or 5 lb in a week) - Continue to follow a low salt diet - Continue to eat more whole grains, fruits and vegetables, lean meats and healthy fats - follow rescue plan for symptoms flare-up - Continue to keep your follow up appointments with your doctors. New primary care provider appointment with Rulon Abide, NP on 09/13/2020 at 10:30am - Continue to watch for swelling in feet, ankles and legs every day. (Report these symptoms to your doctor) -  Schedule appointment for COVID 19 booster.  - Continue to work on decreasing the number of cigarettes per day. (Obtain  / use over the counter smoking patch as advised by your doctor)   Follow Up Plan: The patient has been provided with contact information for the care management team and has been advised to call with any health related questions or concerns.  The care management team will reach out to the patient again over the next 45 days.     Care Plan : Anemia   Updates made by Dannielle Karvonen, RN since 08/12/2020 12:00 AM  Problem: Knowledge deficit related to long term health management of anemia   Priority: High  Long-Range Goal: Anemia Complications Prevented or Managed   Start Date: 06/06/2020  Expected End Date: 11/23/2020  Recent Progress: On track  Priority: High  Current Barriers: Patient states he is doing "ok."  He states he continues to have some fatigue near the middle to end of the day.  He states he continues to walk a few times per week. He states he continues to take his iron medication as prescribed. Patient denies drinking any alcohol. He states he continue to maintain his appetite and supplements his diet with ensure or boost.  . Knowledge deficits related to self health management of anemia or bleeding:   . Literacy barriers:  Patient reports his niece assists him with reading/ understanding medical documentation. Nurse Case Manager Clinical Goal(s):   patient will take all medications  as prescribed and will call provider for medication related questions  patient will verbalize basic understanding of anemia disease process and self health management plan. Interventions:  . Collaboration with [name], NP regarding development and update of comprehensive plan of care as evidenced by provider attestation and co-signature: Patient will have a new primary care provider on 09/13/2020, Rulon Abide, NP.  Discussed with patient his upcoming appointment with new primary care provider. Patient verbalized understanding.  Bertram Savin care team collaboration (see longitudinal plan of care) . Basic overview and discussion of anemia/bleeding disorder or acute disease state . Medications reviewed  . Encouraged to maintain sobriety:  Patient states his last alcoholic drink was on [id number]. . encouraged optimal oral intake to support fluid balance and nutrition:   . encouraged dietary changes to increase dietary intake of iron,  Vitamin 123456 and folic acid as advised/prescribed:  Re-discussed constipation due to iron medication. Advised client to continue to eat fruits, vegetables and drink water.  . Increase your activity as tolerated and as advised by your doctor  . Advised to keep provider appointments as advised:   Patient reports seeing his oncologist on 07/19/2020. He denies any treatment change.  Discussed with patient next follow up with oncology is 09/08/2020. Patient verbalized understanding.  . Patient Goals/Self-Care Activities: - Continue to eat iron rich foods such as: lean meats and fish; Emmalena Canny leafy vegetables, such as kale and spinach; Tam Savoia beans, eggs; dried fruits, such as dates and figs; broccoli. Increase your fruits, vegetables, and water to help with constipation.  Inquire with your doctor or pharmacist about over the counter stool softener or mild laxative.   - Continue to take your medications as prescribed. Refill your medications timely.  - Contact your doctor for new or concerning symptoms.  - Follow up with Hematology specialist as advised.  - Keep follow up appointment with new primary care provider. Appointment is scheduled for 09/13/20 at 10:30am.  - Continue to Avoid alcohol - Monitor for bleeding  from nose, gums, in urine or stool to provider and report these symptoms to your doctor as soon as possible.   Follow Up Plan: The patient has been provided with contact information for the care management team and has been advised to call with any health related questions or concerns.  The care management team will reach out to the patient again over the next 45 days.       Plan:The patient has been provided with contact information for the care management team and has been advised to call with any health related questions or concerns.  and The care management team will reach out to the patient again over the next 45 days. [name] RN,BSN,CCM RN Case Manager [place]   [phone]

## 2020-08-19 ENCOUNTER — Other Ambulatory Visit: Payer: Self-pay | Admitting: Internal Medicine

## 2020-08-19 NOTE — Telephone Encounter (Signed)
Pharmacy requests refill on: Triamcinolone Cream   LAST REFILL: 07/07/20 LAST OV:  06/02/20 for HTN  NEXT OV:  Sees Laverna Peace, NP on 6/21 for transfer of care  PHARMACY:  [facility]

## 2020-08-27 ENCOUNTER — Other Ambulatory Visit: Payer: Self-pay | Admitting: Internal Medicine

## 2020-08-30 ENCOUNTER — Other Ambulatory Visit: Payer: Self-pay | Admitting: Family

## 2020-08-30 ENCOUNTER — Telehealth: Payer: Self-pay | Admitting: Internal Medicine

## 2020-08-30 MED ORDER — ALLOPURINOL 100 MG PO TABS: 200.0000 mg | ORAL_TABLET | Freq: Every day | ORAL | 0 refills | Status: DC

## 2020-08-30 NOTE — Telephone Encounter (Signed)
  LAST APPOINTMENT DATE: 08/27/2020   NEXT APPOINTMENT DATE:'@6'$ /23/2022  MEDICATION:allopurinol   PHARMACY: [facility]  Let patient know to contact pharmacy at the end of the day to make sure medication is ready.  Please notify patient to allow 48-72 hours to process  Encourage patient to contact the pharmacy for refills or they can request refills through [place]:   LAST REFILL:  QTY:  REFILL DATE:    OTHER COMMENTS:    Okay for refill?  Please advise

## 2020-08-30 NOTE — Telephone Encounter (Signed)
Last fill 01/23/2019 Last apt 06/02/20 Next apt 09/13/20, TOC w/ [facility]  Allopurinol [place]

## 2020-09-07 ENCOUNTER — Other Ambulatory Visit: Payer: Self-pay

## 2020-09-07 MED ORDER — MAGNESIUM OXIDE 400 MG PO TABS: 400.0000 mg | ORAL_TABLET | Freq: Every day | ORAL | 1 refills | Status: DC

## 2020-09-07 NOTE — Telephone Encounter (Signed)
LAST APPOINTMENT DATE: 06/02/2020 [facility]    NEXT APPOINTMENT DATE: 09/13/2020 Toc with [facility]     LAST REFILL: 05/28/2020  QTY: 30 with 1 refill

## 2020-09-08 ENCOUNTER — Other Ambulatory Visit: Payer: Medicare HMO

## 2020-09-08 ENCOUNTER — Ambulatory Visit: Payer: Medicare HMO | Admitting: Oncology

## 2020-09-13 ENCOUNTER — Telehealth: Payer: Medicare HMO | Admitting: Adult Health

## 2020-09-15 ENCOUNTER — Ambulatory Visit (INDEPENDENT_AMBULATORY_CARE_PROVIDER_SITE_OTHER): Payer: Medicare HMO

## 2020-09-15 ENCOUNTER — Telehealth: Payer: Medicare HMO | Admitting: Adult Health

## 2020-09-15 ENCOUNTER — Telehealth: Payer: Self-pay | Admitting: Internal Medicine

## 2020-09-15 DIAGNOSIS — D509 Iron deficiency anemia, unspecified: Secondary | ICD-10-CM | POA: Diagnosis not present

## 2020-09-15 DIAGNOSIS — I5022 Chronic systolic (congestive) heart failure: Secondary | ICD-10-CM

## 2020-09-15 NOTE — Patient Instructions (Signed)
Visit Information: Thank you for taking the time to speak with me today.  PATIENT GOALS:  Goals Addressed             This Visit's Progress    Manage anemia   On track    Timeframe:  Long-Range Goal Priority:  Medium Start Date:  06/06/2020                           Expected End Date:   11/23/2020  Follow up:  10/20/2020  Patient goals:  - Continue to eat iron rich foods such as: lean meats and fish; Lovelle Deitrick leafy vegetables, such as kale and spinach; Adah Stoneberg beans, eggs; dried fruits, such as dates and figs; broccoli. Increase your fruits, vegetables, and water to help with constipation.  Inquire with your doctor or pharmacist about over the counter stool softener or mild laxative.   - Continue to take your medications as prescribed. Refill your medications timely.  - Contact your doctor for new or concerning symptoms.  - Follow up with Hematology specialist as advised.  - Continue to Avoid alcohol - Monitor for bleeding from nose, gums, in urine or stool to provider and report these symptoms to your doctor as soon as possible.       Track and Manage  heart failure Symptoms   On track    Timeframe:  Long-Range Goal Priority:  Medium Start Date: 04/27/2020                         Expected End Date:  11/23/2020                   Follow Up Date 10/20/2020    -  continue to take your medications as prescribed. Refill your prescriptions timely. - Continue to weigh daily and record (notifying MD of 3 lb weight gain over night or 5 lb in a week) - Continue to follow a low salt diet - Continue to eat more whole grains, fruits and vegetables, lean meats and healthy fats - follow rescue plan for symptoms flare-up - Continue to keep your follow up appointments with your doctors. New primary care provider appointment with Rulon Abide, NP on 09/13/2020 at 10:30am - Continue to watch for swelling in feet, ankles and legs every day. (Report these symptoms to your doctor) - Remember to schedule  appointment for COVID 19 booster.  - Continue to work on decreasing the number of cigarettes per day.   Why is this important?   You will be able to handle your symptoms better if you keep track of them.  Making some simple changes to your lifestyle will help.  Eating healthy is one thing you can do to take good care of yourself.               Patient verbalizes understanding of instructions provided today and agrees to view in [place].   The patient has been provided with contact information for the care management team and has been advised to call with any health related questions or concerns.  The care management team will reach out to the patient again over the next 45 days.   [name] RN,BSN,CCM RN Case Manager [place]  [phone]

## 2020-09-15 NOTE — Telephone Encounter (Signed)
  LAST APPOINTMENT DATE: 09/07/2020   NEXT APPOINTMENT DATE:'@Visit'$  date not found  MEDICATION:   PHARMACY: [facility]  Let patient know to contact pharmacy at the end of the day to make sure medication is ready.  Please notify patient to allow 48-72 hours to process  Encourage patient to contact the pharmacy for refills or they can request refills through [place]:   LAST REFILL:  QTY:  REFILL DATE:    OTHER COMMENTS:    Okay for refill?  Please advise

## 2020-09-15 NOTE — Chronic Care Management (AMB) (Cosign Needed)
Chronic Care Management   CCM RN Visit Note  09/15/2020 Name: Travis [name] MRN: [id number] DOB: 26-Jul-1955  Subjective: Travis [name] is a 65 y.o. year old male who is a primary care patient of [name], NP. The care management team was consulted for assistance with disease management and care coordination needs.    Engaged with patient by telephone for follow up visit in response to provider referral for case management and/or care coordination services.   Consent to Services:  The patient was given information about Chronic Care Management services, agreed to services, and gave verbal consent prior to initiation of services.  Please see initial visit note for detailed documentation.   Patient agreed to services and verbal consent obtained.   Assessment: Review of patient past medical history, allergies, medications, health status, including review of consultants reports, laboratory and other test data, was performed as part of comprehensive evaluation and provision of chronic care management services.   SDOH (Social Determinants of Health) assessments and interventions performed:    CCM Care Plan  No Known Allergies  Outpatient Encounter Medications as of 09/15/2020  Medication Sig   albuterol (VENTOLIN HFA) 108 (90 Base) MCG/ACT inhaler Inhale 2 puffs into the lungs every 6 (six) hours as needed for wheezing or shortness of breath.   allopurinol (ZYLOPRIM) 100 MG tablet Take 2 tablets (200 mg total) by mouth daily.   aspirin 325 MG tablet Take 162.5 mg by mouth daily.   atorvastatin (LIPITOR) 20 MG tablet Take 20 mg by mouth daily.   carvedilol (COREG) 6.25 MG tablet Take 1 tablet (6.25 mg total) by mouth 2 (two) times daily.   cetirizine (ZYRTEC) 10 MG tablet Take 1 tablet (10 mg total) by mouth daily.   fenofibrate (TRICOR) 145 MG tablet TAKE 1 TABLET BY MOUTH ONCE A DAY   ferrous sulfate 325 (65 FE) MG tablet Take 1 tablet (325 mg total) by mouth daily with breakfast.    fluticasone (FLONASE) 50 MCG/ACT nasal spray Place into both nostrils as needed for allergies or rhinitis.   hydroxypropyl methylcellulose / hypromellose (ISOPTO TEARS / GONIOVISC) 2.5 % ophthalmic solution as needed for dry eyes.   magnesium oxide (MAG-OX) 400 MG tablet Take 1 tablet (400 mg total) by mouth daily.   Multiple Vitamin (MULTIVITAMIN WITH MINERALS) TABS tablet Take 1 tablet by mouth daily. Men's One a Day   Omega-3 Fatty Acids (FISH OIL PO) Take 1 capsule by mouth daily.   pantoprazole (PROTONIX) 40 MG tablet Take 1 tablet (40 mg total) by mouth daily.   sacubitril-valsartan (ENTRESTO) 24-26 MG Take 1 tablet by mouth 2 (two) times daily.   triamcinolone cream (KENALOG) 0.1 % APPLY TO AFFECTED AREAS TWICE A DAY AS NEEDED. AVOID FACE, GROIN, OR UNDERARMS.   No facility-administered encounter medications on file as of 09/15/2020.    Patient Active Problem List   Diagnosis Date Noted   Protein-calorie malnutrition, severe 05/27/2020   Hypotension 05/26/2020   AKI (acute kidney injury) ([place]) 05/26/2020   Alcohol use 11/20/2019   CKD (chronic kidney disease), stage III ([place]) 11/20/2019   TIA (transient ischemic attack) 11/08/2019   Osteoarthritis 05/09/2016   COPD (chronic obstructive pulmonary disease) ([place]) 05/09/2016   Essential hypertension 04/18/2015   Literacy level of illiterate 06/04/2014   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   Cardiomyopathy, nonischemic ([place]) [id number]   Chronic systolic heart failure ([place]) 01/05/2011   Tobacco use 01/05/2011   Diabetes ([place]) 11/30/2008   HLD (hyperlipidemia) 11/30/2008  Gout 11/30/2008    Conditions to be addressed/monitored:CHF and Anemia  Care Plan : Heart Failure ( Adult)  Updates made by Dannielle Karvonen, RN since 09/15/2020 12:00 AM   Problem: Symptom Exacerbation (Heart Failure)   Priority: High   Long-Range Goal: Patient will minimize heart failure exacerbation   Start Date: 04/27/2020  Expected End  Date: 11/23/2020  This Visit's Progress: On track  Recent Progress: On track  Priority: Medium  Current Barriers: Patient denies any symptoms of shortness of breath, swelling or weight gain.  Patient reports he did not weight today.  He reports most recent weight was 186.  Patient reports being in the Kassia Demarinis zone of his heart failure action plan.   Patient states he did not see his primary care provider at his previously scheduled appointment because the office changed the date. He states he is scheduled to see his new provider by video visit on 09/27/2020.   Knowledge deficit related to basic heart failure pathophysiology and self care management:   Literacy Barriers: patient reports his niece assists him with reading / understanding medical documentation.  Case Manager Clinical Goal(s):  Patient will verbalize understanding of Heart Failure Action Plan and when to call doctor Patient will weigh daily and record (notifying MD of 3 lb weight gain over night or 5 lb in a week) Interventions:  Collaboration with [name], NP regarding development and update of comprehensive plan of care as evidenced by provider attestation and co-signature Inter-disciplinary care team collaboration (see longitudinal plan of care) Basic overview and discussion of pathophysiology of Heart Failure reviewed  Provided verbal education on low sodium diet Reviewed Heart Failure Action Plan in depth. Reviewed Heart failure action plan with patient. Patient reports being in the [place] Balsam zone today and denies any symptoms.  Discussed importance of daily weight and advised patient to weigh and record daily:  medication-adherence assessment completed:   Medications reviewed with patient. Patient states he is out of his magnesium.  RNCM called patients primary care provider office and spoke with Randell Patient who states patient's prescription for magnesium was called in on 09/07/2020 to [facility].  RNCM called [facility] and confirmed with Caryl Pina that patients prescription for Magnesium is ready for pick up.  Patient called and notified that prescription is ready. Patient verbalized appreciation to [facility] for follow up.  self-awareness of signs/symptoms of worsening disease encouraged:   Reviewed provider appointments with patient. Next follow up visits with cardiology:  10/03/2020 and 11/18/2020.  Patient advised to scheduled COVID 19 booster:  Re discussed  and encouraged patient to get COVID booster. Patient verbalized understanding.  Smoking cessation discussed:  Patient reports he continues to smoke approximately 1 pack per day.  Patient states he is not ready for assistance with smoking cessation at this time.  RNCM re-discussed smoking cessation strategies that patient can do on his own.  Advised patient that RNCM will provide ongoing encouragement and support.   Patient Goals/Self-Care Activities -  continue to take your medications as prescribed. Refill your prescriptions timely. - Continue to weigh daily and record (notifying MD of 3 lb weight gain over night or 5 lb in a week) - Continue to follow a low salt diet - Continue to eat more whole grains, fruits and vegetables, lean meats and healthy fats - follow rescue plan for symptoms flare-up - Continue to keep your follow up appointments with your doctors. New primary care provider appointment with Rulon Abide, NP on 09/13/2020 at 10:30am -  Continue to watch for swelling in feet, ankles and legs every day. (Report these symptoms to your doctor) - Remember to schedule appointment for COVID 19 booster.  - Continue to work on decreasing the number of cigarettes per day.   Follow Up Plan: The patient has been provided with contact information for the care management team and has been advised to call with any health related questions or concerns.  The care management team will reach out to the patient again over the next 45 days.      Care Plan :  Anemia  Updates made by Dannielle Karvonen, RN since 09/15/2020 12:00 AM   Problem: Knowledge deficit related to long term health management of anemia   Priority: Medium   Long-Range Goal: Anemia Complications Prevented or Managed   Start Date: 06/06/2020  Expected End Date: 11/23/2020  This Visit's Progress: On track  Recent Progress: On track  Priority: Medium  Current Barriers: Patient states he is doing much better. He states his energy level has increased. Patient states he continue to take his medications as prescribed. He states he continue to maintain his appetite and supplements his diet with ensure or boost.  Knowledge deficits related to self health management of anemia or bleeding:   Literacy barriers:  Patient reports his niece assists him with reading/ understanding medical documentation. Nurse Case Manager Clinical Goal(s):  patient will take all medications  as prescribed and will call provider for medication related questions patient will verbalize basic understanding of anemia disease process and self health management plan. Interventions:  Collaboration with [name], NP regarding development and update of comprehensive plan of care as evidenced by provider attestation and co-signature Inter-disciplinary care team collaboration (see longitudinal plan of care) Basic overview and discussion of anemia/bleeding disorder or acute disease state Medications reviewed  Encouraged to maintain sobriety:   encouraged optimal oral intake to support fluid balance and nutrition:   encouraged dietary changes to increase dietary intake of iron, Vitamin 123456 and folic acid as advised/prescribed:  Re-discussed constipation due to iron medication. Advised client to continue to eat fruits, vegetables and drink water.  Increase your activity as tolerated and as advised by your doctor  Advised to keep provider appointments as advised:   Patient reports his next oncology visit is 11/18/20.     Patient Goals/Self-Care Activities: - Continue to eat iron rich foods such as: lean meats and fish; Terrell Shimko leafy vegetables, such as kale and spinach; Vanilla [place] beans, eggs; dried fruits, such as dates and figs; broccoli. Increase your fruits, vegetables, and water to help with constipation.  Inquire with your doctor or pharmacist about over the counter stool softener or mild laxative.   - Continue to take your medications as prescribed. Refill your medications timely.  - Contact your doctor for new or concerning symptoms.  - Follow up with Hematology specialist as advised.  - Continue to Avoid alcohol - Monitor for bleeding from nose, gums, in urine or stool to provider and report these symptoms to your doctor as soon as possible.    Follow Up Plan: The patient has been provided with contact information for the care management team and has been advised to call with any health related questions or concerns.  The care management team will reach out to the patient again over the next 45 days.      Plan:The patient has been provided with contact information for the care management team and has been advised to call with any health  related questions or concerns.  and The care management team will reach out to the patient again over the next 45 days. [name] RN,BSN,CCM RN Case Manager [place]  [phone]

## 2020-09-23 ENCOUNTER — Telehealth: Payer: Self-pay | Admitting: Physician Assistant

## 2020-09-23 ENCOUNTER — Inpatient Hospital Stay: Payer: Medicare HMO | Attending: Oncology

## 2020-09-23 ENCOUNTER — Inpatient Hospital Stay (HOSPITAL_BASED_OUTPATIENT_CLINIC_OR_DEPARTMENT_OTHER): Payer: Medicare HMO | Admitting: Nurse Practitioner

## 2020-09-23 ENCOUNTER — Encounter: Payer: Self-pay | Admitting: Nurse Practitioner

## 2020-09-23 VITALS — BP 90/52 | HR 83 | Temp 97.8°F | Resp 17 | Wt 187.4 lb

## 2020-09-23 DIAGNOSIS — E785 Hyperlipidemia, unspecified: Secondary | ICD-10-CM | POA: Insufficient documentation

## 2020-09-23 DIAGNOSIS — Z8673 Personal history of transient ischemic attack (TIA), and cerebral infarction without residual deficits: Secondary | ICD-10-CM | POA: Diagnosis not present

## 2020-09-23 DIAGNOSIS — I130 Hypertensive heart and chronic kidney disease with heart failure and stage 1 through stage 4 chronic kidney disease, or unspecified chronic kidney disease: Secondary | ICD-10-CM | POA: Diagnosis not present

## 2020-09-23 DIAGNOSIS — J45909 Unspecified asthma, uncomplicated: Secondary | ICD-10-CM | POA: Insufficient documentation

## 2020-09-23 DIAGNOSIS — D539 Nutritional anemia, unspecified: Secondary | ICD-10-CM | POA: Diagnosis not present

## 2020-09-23 DIAGNOSIS — I429 Cardiomyopathy, unspecified: Secondary | ICD-10-CM | POA: Diagnosis not present

## 2020-09-23 DIAGNOSIS — I509 Heart failure, unspecified: Secondary | ICD-10-CM | POA: Insufficient documentation

## 2020-09-23 DIAGNOSIS — N189 Chronic kidney disease, unspecified: Secondary | ICD-10-CM

## 2020-09-23 DIAGNOSIS — Z7982 Long term (current) use of aspirin: Secondary | ICD-10-CM | POA: Diagnosis not present

## 2020-09-23 DIAGNOSIS — R42 Dizziness and giddiness: Secondary | ICD-10-CM | POA: Diagnosis not present

## 2020-09-23 DIAGNOSIS — I2510 Atherosclerotic heart disease of native coronary artery without angina pectoris: Secondary | ICD-10-CM | POA: Diagnosis not present

## 2020-09-23 DIAGNOSIS — M1990 Unspecified osteoarthritis, unspecified site: Secondary | ICD-10-CM | POA: Insufficient documentation

## 2020-09-23 DIAGNOSIS — D631 Anemia in chronic kidney disease: Secondary | ICD-10-CM | POA: Diagnosis not present

## 2020-09-23 DIAGNOSIS — F1010 Alcohol abuse, uncomplicated: Secondary | ICD-10-CM | POA: Diagnosis not present

## 2020-09-23 DIAGNOSIS — N289 Disorder of kidney and ureter, unspecified: Secondary | ICD-10-CM

## 2020-09-23 DIAGNOSIS — I5022 Chronic systolic (congestive) heart failure: Secondary | ICD-10-CM

## 2020-09-23 DIAGNOSIS — Z79899 Other long term (current) drug therapy: Secondary | ICD-10-CM | POA: Diagnosis not present

## 2020-09-23 LAB — CBC WITH DIFFERENTIAL/PLATELET
Abs Immature Granulocytes: 0.01 10*3/uL (ref 0.00–0.07)
Basophils Absolute: 0 10*3/uL (ref 0.0–0.1)
Basophils Relative: 0 %
Eosinophils Absolute: 0.7 10*3/uL — ABNORMAL HIGH (ref 0.0–0.5)
Eosinophils Relative: 15 %
HCT: 33.7 % — ABNORMAL LOW (ref 39.0–52.0)
Hemoglobin: 11.2 g/dL — ABNORMAL LOW (ref 13.0–17.0)
Immature Granulocytes: 0 %
Lymphocytes Relative: 25 %
Lymphs Abs: 1.2 10*3/uL (ref 0.7–4.0)
MCH: 32.5 pg (ref 26.0–34.0)
MCHC: 33.2 g/dL (ref 30.0–36.0)
MCV: 97.7 fL (ref 80.0–100.0)
Monocytes Absolute: 0.5 10*3/uL (ref 0.1–1.0)
Monocytes Relative: 10 %
Neutro Abs: 2.4 10*3/uL (ref 1.7–7.7)
Neutrophils Relative %: 50 %
Platelets: 258 10*3/uL (ref 150–400)
RBC: 3.45 MIL/uL — ABNORMAL LOW (ref 4.22–5.81)
RDW: 15.4 % (ref 11.5–15.5)
WBC: 4.8 10*3/uL (ref 4.0–10.5)
nRBC: 0 % (ref 0.0–0.2)

## 2020-09-23 LAB — BASIC METABOLIC PANEL
Anion gap: 8 (ref 5–15)
BUN: 35 mg/dL — ABNORMAL HIGH (ref 8–23)
CO2: 25 mmol/L (ref 22–32)
Calcium: 9.9 mg/dL (ref 8.9–10.3)
Chloride: 102 mmol/L (ref 98–111)
Creatinine, Ser: 2.06 mg/dL — ABNORMAL HIGH (ref 0.61–1.24)
GFR, Estimated: 35 mL/min — ABNORMAL LOW (ref >60)
Glucose, Bld: 164 mg/dL — ABNORMAL HIGH (ref 70–99)
Potassium: 4.8 mmol/L (ref 3.5–5.1)
Sodium: 135 mmol/L (ref 135–145)

## 2020-09-23 NOTE — Progress Notes (Signed)
Hematology/Oncology Consult note [facility]  Telephone:([phone] [phone]  Patient Care Team: [name], NP as PCP - General (Internal Medicine) Wellington Hampshire, MD as PCP - Cardiology (Cardiology) Alisa Graff, FNP as Nurse Practitioner (Cardiology) Wellington Hampshire, MD as Consulting Physician (Cardiology) Dannielle Karvonen, RN as Case Manager   Name of the patient: Travis [name]  [id number]  10/01/1955   Date of visit: 09/23/20  Diagnosis-macrocytic anemia  Chief complaint/ Reason for visit-discuss results of blood work  Heme/Onc history: patient is a 65 year old male with a past medical history significant for hypertension hyperlipidemia CKD referred for pancytopenia.  He also has a history of nonischemic cardiomyopathy and central retinal artery occlusion.  Most recent CBC from 06/02/2020 showed white count of 4.3, H&H of 9.7/29.4 with an MCV of 104 and a platelet count of 22.  Looking back at his prior CBCs patient's white count has been mostly around 4 with an [place] that fluctuates between 1.3-6.  He has had intermittent thrombocytopenia in the past since 2020 when his platelet counts have fluctuated between 100s to 120s but have also been normal sometimes.  Hemoglobin likewise has varied between 7-9 since August 2021 but prior to that he was normal at 12.  Patient was hospitalized for blood loss anemia secondary to nonbleeding prepyloric gastric ulcer as well as gastritis and duodenal erosions.  Patient presently reports mild fatigue but denies other complaints at this time.  Patient was drinking alcohol up until August 2021But quit drinking after his hospitalization in August.  Interval history- Patient returns to clinic for labs and re-evaluation. He reports ongoing fatigue which is chronic but improved. Reports dizziness when changing positions. Believe this is since he has started his bp medications. He has stopped drinking alcohol and  feels well. Denies any neurologic complaints. Denies recent fevers or illnesses. Denies any easy bleeding or bruising. No melena or hematochezia. No pica or restless leg. Reports good appetite and denies weight loss. Denies chest pain. Denies any nausea, vomiting, constipation, or diarrhea. Denies urinary complaints. Patient offers no further specific complaints today.   ECOG PS- 1 Pain scale- 0   Review of systems- Review of Systems  Constitutional:  Positive for malaise/fatigue. Negative for chills, fever and weight loss.  HENT:  Negative for congestion, ear discharge and nosebleeds.   Eyes:  Negative for blurred vision.  Respiratory:  Negative for cough, hemoptysis, sputum production, shortness of breath and wheezing.   Cardiovascular:  Negative for chest pain, palpitations, orthopnea and claudication.  Gastrointestinal:  Negative for abdominal pain, blood in stool, constipation, diarrhea, heartburn, melena, nausea and vomiting.  Genitourinary:  Negative for dysuria, flank pain, frequency, hematuria and urgency.  Musculoskeletal:  Negative for back pain, joint pain and myalgias.  Skin:  Negative for rash.  Neurological:  Positive for dizziness. Negative for tingling, focal weakness, seizures, weakness and headaches.  Endo/Heme/Allergies:  Does not bruise/bleed easily.  Psychiatric/Behavioral:  Negative for depression and suicidal ideas. The patient does not have insomnia.     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 05/2019 Echo: EF 35-40%; b. 10/2019 Echo: EF 45-50%.   Allergy    Asthma    Central retinal artery occlusion of left eye 09/13/13   Chronic anemia    Clotting disorder ([place])    Diabetes mellitus, type 2 ([place])    pt reports  his DM is gone   GI bleed    15 years ago   Gout    Hemoptysis    secondary to pulmonary edema   HFimpEF (heart failure with improved ejection fraction) ([place])     a. 12/2010 Echo: EF 20-25%; b 08/2013 Echo: EF 45-50%; c. 01/2015 Echo: EF 25-30%; d. 07/2016 Echo: EF 35-40%; e. 05/2019 Echo: EF 35-40%; d. 10/2019 Echo: EF 45-50%, mild LVH, mild red RV fxn, mildl dil RA, Triv MR. Mild to mod Ao Sclerosis w/o stenosis.   Hyperlipidemia    Hypertension    Nonischemic cardiomyopathy (HCC)    Nonobstructive Coronary artery disease    a. 01/2015 Cath: LM nl, LAD 30p/m, LCX nl, RCA 10p/m, RPDA min irregs.   Osteoarthritis    Pneumonia    TIA (transient ischemic attack)    Tobacco abuse    Vision loss    peripherial vision only left eye.Central Retinal  artery occusion      Past Surgical History:  Procedure Laterality Date   BIOPSY  11/25/2019   Procedure: BIOPSY;  Surgeon: Ladene Artist, MD;  Location: [facility];  Service: Endoscopy;;   CARDIAC CATHETERIZATION     CARDIAC CATHETERIZATION N/A 01/28/2015   Procedure: Left Heart Cath and Coronary Angiography;  Surgeon: Wellington Hampshire, MD;  Location: [facility];  Service: Cardiovascular;  Laterality: N/A;   COLONOSCOPY     ESOPHAGOGASTRODUODENOSCOPY (EGD) WITH PROPOFOL N/A 11/25/2019   Procedure: ESOPHAGOGASTRODUODENOSCOPY (EGD) WITH PROPOFOL;  Surgeon: Ladene Artist, MD;  Location[facility] ENDOSCOPY;  Service: Endoscopy;  Laterality: N/A;   HIP ARTHROPLASTY Right 03/15/2013   Procedure: ARTHROPLASTY BIPOLAR HIP;  Surgeon: [name], MD;  Location: [facility];  Service: Orthopedics;  Laterality: Right;   TOTAL SHOULDER ARTHROPLASTY Right 09/01/2015   Procedure: RIGHT TOTAL SHOULDER ARTHROPLASTY;  Surgeon: [name], MD;  Location: [facility];  Service: Orthopedics;  Laterality: Right;    Social History   Socioeconomic History   Marital status: Single    Spouse name: Not on file   Number of children: 0   Years of education: 12   Highest education level: High school graduate  Occupational History   Occupation: Retired  Tobacco Use   Smoking status: Every Day    Packs/day: 0.50     Years: 30.00    Pack years: 15.00    Types: Cigarettes   Smokeless tobacco: Never  Vaping Use   Vaping Use: Never used  Substance and Sexual Activity   Alcohol use: Not Currently    Alcohol/week: 1.0 standard drink    Types: 1 Glasses of wine per week    Comment: occasionally   Drug use: Yes    Types: Marijuana   Sexual activity: Yes    Birth control/protection: Condom  Other Topics Concern   Not on file  Social History Narrative   Not on file   Social Determinants of Health   Financial Resource Strain: Low Risk    Difficulty of Paying Living Expenses: Not hard at all  Food Insecurity: No Food Insecurity   Worried About Charity fundraiser in the Last Year: Never true   Ran Out of Food in the Last Year: Never true  Transportation Needs: No Transportation Needs   Lack of Transportation (Medical): No   Lack of Transportation (Non-Medical): No  Physical Activity: Inactive   Days of Exercise per Week: 0 days   Minutes of Exercise per Session: 0 min  Stress: No Stress Concern Present  Feeling of Stress : Only a little  Social Connections: Socially Isolated   Frequency of Communication with Friends and Family: More than three times a week   Frequency of Social Gatherings with Friends and Family: More than three times a week   Attends Religious Services: Never   Marine scientist or Organizations: No   Attends Music therapist: Never   Marital Status: Never married  Human resources officer Violence: Not At Risk   Fear of Current or Ex-Partner: No   Emotionally Abused: No   Physically Abused: No   Sexually Abused: No    Family History  Problem Relation Age of Onset   Diabetes Mother    Hypertension Mother    Diabetes Father    Hypertension Father    Diabetes Sister    Dementia Brother    Cancer Neg Hx    Heart disease Neg Hx    Stroke Neg Hx    Colon cancer Neg Hx    Esophageal cancer Neg Hx    Rectal cancer Neg Hx    Stomach cancer Neg Hx       Current Outpatient Medications:    albuterol (VENTOLIN HFA) 108 (90 Base) MCG/ACT inhaler, Inhale 2 puffs into the lungs every 6 (six) hours as needed for wheezing or shortness of breath., Disp: , Rfl:    allopurinol (ZYLOPRIM) 100 MG tablet, Take 2 tablets (200 mg total) by mouth daily., Disp: 180 tablet, Rfl: 0   aspirin 325 MG tablet, Take 162.5 mg by mouth daily., Disp: , Rfl:    atorvastatin (LIPITOR) 20 MG tablet, Take 20 mg by mouth daily., Disp: , Rfl:    carvedilol (COREG) 6.25 MG tablet, Take 1 tablet (6.25 mg total) by mouth 2 (two) times daily., Disp: 180 tablet, Rfl: 3   cetirizine (ZYRTEC) 10 MG tablet, Take 1 tablet (10 mg total) by mouth daily., Disp: 90 tablet, Rfl: 1   fenofibrate (TRICOR) 145 MG tablet, TAKE 1 TABLET BY MOUTH ONCE A DAY, Disp: 90 tablet, Rfl: 0   ferrous sulfate 325 (65 FE) MG tablet, Take 1 tablet (325 mg total) by mouth daily with breakfast., Disp: 90 tablet, Rfl: 2   fluticasone (FLONASE) 50 MCG/ACT nasal spray, Place into both nostrils as needed for allergies or rhinitis., Disp: , Rfl:    hydroxypropyl methylcellulose / hypromellose (ISOPTO TEARS / GONIOVISC) 2.5 % ophthalmic solution, as needed for dry eyes., Disp: , Rfl:    magnesium oxide (MAG-OX) 400 MG tablet, Take 1 tablet (400 mg total) by mouth daily., Disp: 30 tablet, Rfl: 1   Multiple Vitamin (MULTIVITAMIN WITH MINERALS) TABS tablet, Take 1 tablet by mouth daily. Men's One a Day, Disp: , Rfl:    Omega-3 Fatty Acids (FISH OIL PO), Take 1 capsule by mouth daily., Disp: , Rfl:    pantoprazole (PROTONIX) 40 MG tablet, Take 1 tablet (40 mg total) by mouth daily., Disp: 90 tablet, Rfl: 2   sacubitril-valsartan (ENTRESTO) 24-26 MG, Take 1 tablet by mouth 2 (two) times daily., Disp: 60 tablet, Rfl: 6   triamcinolone cream (KENALOG) 0.1 %, APPLY TO AFFECTED AREAS TWICE A DAY AS NEEDED. AVOID FACE, GROIN, OR UNDERARMS., Disp: 454 g, Rfl: 1  Physical exam:  Vitals:   09/23/20 1334  BP: (!) 90/52   Pulse: 83  Resp: 17  Temp: 97.8 F (36.6 C)  TempSrc: Tympanic  SpO2: 99%  Weight: 187 lb 6.4 oz (85 kg)   General: Well-developed, well-nourished, no acute distress. Eyes: Pink  conjunctiva, anicteric sclera. Lungs: Clear to auscultation bilaterally.  No audible wheezing or coughing Heart: Regular rate and rhythm.  Abdomen: Soft, nontender, nondistended.  Musculoskeletal: No edema, cyanosis, or clubbing. Neuro: Alert, answering all questions appropriately. Cranial nerves grossly intact. Skin: No rashes or petechiae noted. Psych: Normal affect.    CMP Latest Ref Rng & Units 09/23/2020  Glucose 70 - 99 mg/dL 164(H)  BUN 8 - 23 mg/dL 35(H)  Creatinine 0.61 - 1.24 mg/dL 2.06(H)  Sodium 135 - 145 mmol/L 135  Potassium 3.5 - 5.1 mmol/L 4.8  Chloride 98 - 111 mmol/L 102  CO2 22 - 32 mmol/L 25  Calcium 8.9 - 10.3 mg/dL 9.9  Total Protein 6.5 - 8.1 g/dL -  Total Bilirubin 0.3 - 1.2 mg/dL -  Alkaline Phos 38 - 126 U/L -  AST 15 - 41 U/L -  ALT 0 - 44 U/L -   CBC Latest Ref Rng & Units 09/23/2020  WBC 4.0 - 10.5 K/uL 4.8  Hemoglobin 13.0 - 17.0 g/dL 11.2(L)  Hematocrit 39.0 - 52.0 % 33.7(L)  Platelets 150 - 400 K/uL 258    No images are attached to the encounter.  No results found.    Assessment and plan- Patient is a 65 y.o. male referred for pancytopenia mainly macrocytic anemia, intermittent leukopenia, now resolved. He has stopped drinking alcohol and hemoglobin has gradually improved. Macrocytosis has resolved. Previously anemia work up did not reveal an obvious etiology. MM was unremarkable, iron studies, b12, folate, b1 and b6 were normal. Discussed bone marrow biopsy but patient wished to hold today. Today counts have improved. Persistent anemia but significantly improved. Agree with recommendation to monitor.  If his anemia remains significant with worsening macrocytosis I would favor getting a bone marrow biopsy to rule out a primary hematological process involving the  bone marrow  CKD- worse. Will refer to nephrology for evaluation and management.   Plan:  Ref to nephrology; patient request to stay nearby 6 mo - lab, [place]   Visit Diagnosis 1. Chronic kidney disease, unspecified CKD stage   2. Macrocytic anemia    Beckey Rutter, DNP, AGNP-C [place] [facility] [phone] (clinic) 09/23/2020 2:15 PM

## 2020-09-23 NOTE — Progress Notes (Signed)
Follow up for anemia. Feels dizzy if he jumps up to fast. Denies dyspnea. No visible blood loss.

## 2020-09-23 NOTE — Telephone Encounter (Signed)
Rise Mu, PA-C  09/23/2020  1:35 PM EDT      09/23/2020  -  received recommended 1 week follow-up BMP (ordered on 06/21/2020, to be drawn in 06/2020) after initiating Entresto in the spring.  Renal function is worse.  Patient has a history of recurrent volume depletion with multiplepresentations with acute on CKD   Recommendations: -From a cardiac perspective, stop Entresto -Avoid NSAIDs -Increase water intake -He should discuss continuation of allopurinol and pantoprazole with his PCPgiven worsening renal function -BMET to be drawn in the medical mall on 7/5 -If renal function remains significantly elevated at that time he will requirereferral to nephrology

## 2020-09-23 NOTE — Telephone Encounter (Signed)
I called and spoke with the patient regarding his BMP results and Christell Faith, PA's recommendations as stated below.  The patient patient voices understanding of his results and of Ryan's recommendations to: 1) Stop entresto 2) Avoid NSAIDS (he states he uses tylenol) 3) Increase water intake 4) Discuss whether he should continue allopurinol/ pantoprazole with his PCP (he advised he is pending an appointment at the end of August with a new PCP as [name], NP left [place] at [facility]) - I advised I will forward a message to Laverna Peace, NP whom he has an appointment with at the end of August to review his currently labs and be thinking about whether he should be been sooner 5) BMP on 7/5 at the [facility] (he is aware of the location and that can go on a walk in basis anytime on Tuesday 7/5 7:30 am- 5:30 pm) - orders placed 6) he is aware if renal function is still elevated on 7/5 we may need to think about a referral to nephrology.  The patient again confirms understanding of the above and is agreeable.

## 2020-09-27 ENCOUNTER — Telehealth: Payer: Medicare HMO | Admitting: Adult Health

## 2020-09-27 ENCOUNTER — Other Ambulatory Visit
Admission: RE | Admit: 2020-09-27 | Discharge: 2020-09-27 | Disposition: A | Discharge status: Home / Self Care | Payer: Medicare HMO | Attending: Nurse Practitioner | Admitting: Nurse Practitioner

## 2020-09-27 DIAGNOSIS — N289 Disorder of kidney and ureter, unspecified: Secondary | ICD-10-CM | POA: Diagnosis not present

## 2020-09-27 DIAGNOSIS — D539 Nutritional anemia, unspecified: Secondary | ICD-10-CM | POA: Diagnosis not present

## 2020-09-27 DIAGNOSIS — N189 Chronic kidney disease, unspecified: Secondary | ICD-10-CM | POA: Diagnosis not present

## 2020-09-27 LAB — BASIC METABOLIC PANEL
Anion gap: 8 (ref 5–15)
BUN: 29 mg/dL — ABNORMAL HIGH (ref 8–23)
CO2: 25 mmol/L (ref 22–32)
Calcium: 10.2 mg/dL (ref 8.9–10.3)
Chloride: 105 mmol/L (ref 98–111)
Creatinine, Ser: 1.56 mg/dL — ABNORMAL HIGH (ref 0.61–1.24)
GFR, Estimated: 49 mL/min — ABNORMAL LOW (ref >60)
Glucose, Bld: 149 mg/dL — ABNORMAL HIGH (ref 70–99)
Potassium: 4.5 mmol/L (ref 3.5–5.1)
Sodium: 138 mmol/L (ref 135–145)

## 2020-09-27 LAB — CBC WITH DIFFERENTIAL/PLATELET
Abs Immature Granulocytes: 0.01 10*3/uL (ref 0.00–0.07)
Basophils Absolute: 0 10*3/uL (ref 0.0–0.1)
Basophils Relative: 0 %
Eosinophils Absolute: 0.6 10*3/uL — ABNORMAL HIGH (ref 0.0–0.5)
Eosinophils Relative: 13 %
HCT: 34.4 % — ABNORMAL LOW (ref 39.0–52.0)
Hemoglobin: 11.6 g/dL — ABNORMAL LOW (ref 13.0–17.0)
Immature Granulocytes: 0 %
Lymphocytes Relative: 27 %
Lymphs Abs: 1.3 10*3/uL (ref 0.7–4.0)
MCH: 32.8 pg (ref 26.0–34.0)
MCHC: 33.7 g/dL (ref 30.0–36.0)
MCV: 97.2 fL (ref 80.0–100.0)
Monocytes Absolute: 0.4 10*3/uL (ref 0.1–1.0)
Monocytes Relative: 9 %
Neutro Abs: 2.4 10*3/uL (ref 1.7–7.7)
Neutrophils Relative %: 51 %
Platelets: 242 10*3/uL (ref 150–400)
RBC: 3.54 MIL/uL — ABNORMAL LOW (ref 4.22–5.81)
RDW: 15.5 % (ref 11.5–15.5)
WBC: 4.7 10*3/uL (ref 4.0–10.5)
nRBC: 0 % (ref 0.0–0.2)

## 2020-09-27 LAB — IRON AND TIBC
Iron: 94 ug/dL (ref 45–182)
Saturation Ratios: 25 % (ref 17.9–39.5)
TIBC: 375 ug/dL (ref 250–450)
UIBC: 281 ug/dL

## 2020-09-27 LAB — FERRITIN: Ferritin: 494 ng/mL — ABNORMAL HIGH (ref 24–336)

## 2020-09-27 NOTE — Telephone Encounter (Signed)
Reviewed results and recommendations with patient. Referral placed for nephrology. Confirmed appointment for our office in August and he had no further questions at this time.

## 2020-09-27 NOTE — Telephone Encounter (Signed)
-----   Message from Rise Mu, PA-C sent at 09/27/2020  3:01 PM EDT ----- Random glucose mildly elevated with known diabetes. Renal function remains elevated though is improved from prior reading and consistent with prior readings. Potassium at goal.  Recommendations: -For now, he should continue to hold Entresto -Please refer to nephrology for evaluation of CKD -When he is seen in follow-up we can revisit resuming medical therapy for his cardiomyopathy

## 2020-09-28 NOTE — Telephone Encounter (Signed)
Faxed referral

## 2020-10-01 NOTE — Progress Notes (Deleted)
Patient ID: Travis [name], male    DOB: [birth year], 65 y.o.   MRN: [id number]  HPI  Mr Morning is a 65 y/o male with a history of osteoarthritis, hyperlipidemia, gout, HTN, GI bleed, DM, CAD, anemia, asthma, previous alcohol use, current tobacco use and chronic heart failure.  Echo report from 11/09/19 reviewed and showed an EF of 45-50% along with mild LVH. Echo report from 06/04/19 reviewed and showed an EF of 35-40% along with mild LVH. Reviewed last echo done 08/08/16 which showed an EF of 35-40%. This is an improvement from his last echo which was done 01/26/15 and showed an EF of 25-30% along with trivial MR/ TR. EF had dropped from 2015 when it was 45-50%.   Had a cardiac catheterization done 01/28/15 which showed 10% stenosis in Prox RCA to Mid RCA and 30% stenosis in Prox LAD to Mid LAD which is not much different from previous angiography. Cardiomyopathy felt to be due to previous alcohol use.   Patient was admitted to the hospital on 05/26/20 due to hypotension and atypical chest pain. Cardiology consult obtained. IVF given. Medications reduced.      Discharged after 2 days.   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 05/2019 Echo: EF 35-40%; b. 10/2019 Echo: EF 45-50%.   Allergy    Asthma    Central retinal artery occlusion of left eye 09/13/13   Chronic anemia    Clotting disorder ([place])    Diabetes mellitus, type 2 ([place])    pt reports his DM is gone   GI bleed    15 years ago   Gout    Hemoptysis    secondary to pulmonary edema   HFimpEF (heart failure with improved ejection fraction) ([place])    a. 12/2010 Echo: EF 20-25%; b 08/2013 Echo: EF 45-50%; c. 01/2015 Echo: EF 25-30%; d. 07/2016 Echo: EF 35-40%; e. 05/2019 Echo: EF 35-40%; d. 10/2019 Echo: EF 45-50%, mild LVH, mild red RV fxn, mildl dil RA, Triv MR. Mild to mod Ao Sclerosis w/o stenosis.    Hyperlipidemia    Hypertension    Nonischemic cardiomyopathy (HCC)    Nonobstructive Coronary artery disease    a. 01/2015 Cath: LM nl, LAD 30p/m, LCX nl, RCA 10p/m, RPDA min irregs.   Osteoarthritis    Pneumonia    TIA (transient ischemic attack)    Tobacco abuse    Vision loss    peripherial vision only left eye.Central Retinal  artery occusion    Past Surgical History:  Procedure Laterality Date   BIOPSY  11/25/2019   Procedure: BIOPSY;  Surgeon: Ladene Artist, MD;  Location: [place];  Service: Endoscopy;;   CARDIAC CATHETERIZATION     CARDIAC CATHETERIZATION N/A 01/28/2015   Procedure: Left Heart Cath and Coronary Angiography;  Surgeon: Wellington Hampshire, MD;  Location: [place] [facility];  Service: Cardiovascular;  Laterality: N/A;   COLONOSCOPY     ESOPHAGOGASTRODUODENOSCOPY (EGD) WITH PROPOFOL N/A 11/25/2019   Procedure: ESOPHAGOGASTRODUODENOSCOPY (EGD) WITH PROPOFOL;  Surgeon: Ladene Artist, MD;  Location: [facility] ENDOSCOPY;  Service: Endoscopy;  Laterality: N/A;   HIP ARTHROPLASTY Right 03/15/2013   Procedure: ARTHROPLASTY BIPOLAR HIP;  Surgeon: [name], MD;  Location: [place];  Service: Orthopedics;  Laterality: Right;   TOTAL SHOULDER ARTHROPLASTY Right 09/01/2015   Procedure: RIGHT TOTAL SHOULDER  ARTHROPLASTY;  Surgeon: [name], MD;  Location: [place];  Service: Orthopedics;  Laterality: Right;   Family History  Problem Relation Age of Onset   Diabetes Mother    Hypertension Mother    Diabetes Father    Hypertension Father    Diabetes Sister    Dementia Brother    Cancer Neg Hx    Heart disease Neg Hx    Stroke Neg Hx    Colon cancer Neg Hx    Esophageal cancer Neg Hx    Rectal cancer Neg Hx    Stomach cancer Neg Hx    Social History   Tobacco Use   Smoking status: Every Day    Packs/day: 0.50    Years: 30.00    Pack years: 15.00    Types: Cigarettes   Smokeless tobacco: Never  Substance Use Topics   Alcohol use: Not Currently     Alcohol/week: 1.0 standard drink    Types: 1 Glasses of wine per week    Comment: occasionally   No Known Allergies Past Medical History:  Diagnosis Date   Alcohol abuse    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 05/2019 Echo: EF 35-40%; b. 10/2019 Echo: EF 45-50%.   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 12/2010 Echo: EF 20-25%; b 08/2013 Echo: EF 45-50%; c. 01/2015 Echo: EF 25-30%; d. 07/2016 Echo: EF 35-40%; e. 05/2019 Echo: EF 35-40%; d. 10/2019 Echo: EF 45-50%, mild LVH, mild red RV fxn, mildl dil RA, Triv MR. Mild to mod Ao Sclerosis w/o stenosis.   Hyperlipidemia    Hypertension    Nonischemic cardiomyopathy (HCC)    Nonobstructive Coronary artery disease    a. 01/2015 Cath: LM nl, LAD 30p/m, LCX nl, RCA 10p/m, RPDA min irregs.   Osteoarthritis    Pneumonia    TIA (transient ischemic attack)    Tobacco abuse    Vision loss    peripherial vision only left eye.Central Retinal  artery occusion    Past Surgical History:  Procedure Laterality Date   BIOPSY  11/25/2019   Procedure: BIOPSY;  Surgeon: Ladene Artist, MD;  Location: [place];  Service: Endoscopy;;   CARDIAC CATHETERIZATION     CARDIAC CATHETERIZATION N/A 01/28/2015   Procedure: Left Heart Cath and Coronary Angiography;  Surgeon: Wellington Hampshire, MD;  Location: [place] [facility];  Service: Cardiovascular;  Laterality: N/A;   COLONOSCOPY     ESOPHAGOGASTRODUODENOSCOPY (EGD) WITH PROPOFOL N/A 11/25/2019   Procedure: ESOPHAGOGASTRODUODENOSCOPY (EGD) WITH PROPOFOL;  Surgeon: Ladene Artist, MD;  Location: [facility] ENDOSCOPY;  Service: Endoscopy;  Laterality: N/A;   HIP ARTHROPLASTY Right 03/15/2013   Procedure:  ARTHROPLASTY BIPOLAR HIP;  Surgeon: [name], MD;  Location: [place];  Service: Orthopedics;  Laterality: Right;   TOTAL SHOULDER ARTHROPLASTY Right 09/01/2015   Procedure: RIGHT TOTAL SHOULDER ARTHROPLASTY;  Surgeon: [name], MD;  Location: [place];  Service: Orthopedics;  Laterality: Right;   Family History  Problem Relation Age of Onset   Diabetes Mother    Hypertension Mother  Diabetes Father    Hypertension Father    Diabetes Sister    Dementia Brother    Cancer Neg Hx    Heart disease Neg Hx    Stroke Neg Hx    Colon cancer Neg Hx    Esophageal cancer Neg Hx    Rectal cancer Neg Hx    Stomach cancer Neg Hx    Social History   Tobacco Use   Smoking status: Every Day    Packs/day: 0.50    Years: 30.00    Pack years: 15.00    Types: Cigarettes   Smokeless tobacco: Never  Substance Use Topics   Alcohol use: Not Currently    Alcohol/week: 1.0 standard drink    Types: 1 Glasses of wine per week    Comment: occasionally   No Known Allergies    Review of Systems    Physical Exam Cardiovascular:     Rate and Rhythm: Tachycardia present.     Assessment & Plan:  1: Chronic heart failure with reduced ejection fraction- - NYHA class I - euvolemic today - weighing daily and call for an overnight weight gain of >2 pounds/weekly weight gain of >5 pounds.  - weight down 10 lbs since last visit here 2 months ago - trying to not add salt to his food  - Drinking approximately 64 oz of fluid daily - saw cardiology Fletcher Anon) 07/19/20 - BNP 11/20/19 was 365.0  2: HTN- - BP  - BMP from 09/27/20 reviewed and shows sodium 138, potassium 4.5, creatinine 1.56 and GFR 49 - saw his PCP Garnette Gunner) 06/02/20  3: Tobacco use- - continues to smoke about 1 ppd of cigarettes  - complete cessation discussed for 3 minutes with him  4: Alcohol use- - Reports that he drinks "a little bit" every now and then - unable to quantify what "a little bit" is but says that he drank  some a few days ago when it snowed   Patient did not bring his medications nor a list. Each medication was verbally reviewed with the patient and he was encouraged to bring the bottles to every visit to confirm accuracy of list. Advised patient to call back after he gets home to go over medications.

## 2020-10-02 ENCOUNTER — Emergency Department (HOSPITAL_COMMUNITY): Payer: Medicare HMO

## 2020-10-02 ENCOUNTER — Emergency Department (HOSPITAL_COMMUNITY)
Admission: EM | Admit: 2020-10-02 | Discharge: 2020-10-02 | Disposition: A | Discharge status: Home / Self Care | Payer: Medicare HMO | Attending: Emergency Medicine | Admitting: Emergency Medicine

## 2020-10-02 ENCOUNTER — Encounter (HOSPITAL_COMMUNITY): Payer: Self-pay

## 2020-10-02 ENCOUNTER — Other Ambulatory Visit: Payer: Self-pay

## 2020-10-02 DIAGNOSIS — Z96611 Presence of right artificial shoulder joint: Secondary | ICD-10-CM | POA: Insufficient documentation

## 2020-10-02 DIAGNOSIS — Y9301 Activity, walking, marching and hiking: Secondary | ICD-10-CM | POA: Diagnosis not present

## 2020-10-02 DIAGNOSIS — J449 Chronic obstructive pulmonary disease, unspecified: Secondary | ICD-10-CM | POA: Diagnosis not present

## 2020-10-02 DIAGNOSIS — E1122 Type 2 diabetes mellitus with diabetic chronic kidney disease: Secondary | ICD-10-CM | POA: Diagnosis not present

## 2020-10-02 DIAGNOSIS — I2510 Atherosclerotic heart disease of native coronary artery without angina pectoris: Secondary | ICD-10-CM | POA: Insufficient documentation

## 2020-10-02 DIAGNOSIS — I5022 Chronic systolic (congestive) heart failure: Secondary | ICD-10-CM | POA: Diagnosis not present

## 2020-10-02 DIAGNOSIS — J45909 Unspecified asthma, uncomplicated: Secondary | ICD-10-CM | POA: Diagnosis not present

## 2020-10-02 DIAGNOSIS — N183 Chronic kidney disease, stage 3 unspecified: Secondary | ICD-10-CM | POA: Diagnosis not present

## 2020-10-02 DIAGNOSIS — I130 Hypertensive heart and chronic kidney disease with heart failure and stage 1 through stage 4 chronic kidney disease, or unspecified chronic kidney disease: Secondary | ICD-10-CM | POA: Insufficient documentation

## 2020-10-02 DIAGNOSIS — Z79899 Other long term (current) drug therapy: Secondary | ICD-10-CM | POA: Insufficient documentation

## 2020-10-02 DIAGNOSIS — M25552 Pain in left hip: Secondary | ICD-10-CM | POA: Insufficient documentation

## 2020-10-02 DIAGNOSIS — X501XXA Overexertion from prolonged static or awkward postures, initial encounter: Secondary | ICD-10-CM | POA: Insufficient documentation

## 2020-10-02 DIAGNOSIS — M1612 Unilateral primary osteoarthritis, left hip: Secondary | ICD-10-CM | POA: Diagnosis not present

## 2020-10-02 DIAGNOSIS — Z7982 Long term (current) use of aspirin: Secondary | ICD-10-CM | POA: Diagnosis not present

## 2020-10-02 DIAGNOSIS — F17210 Nicotine dependence, cigarettes, uncomplicated: Secondary | ICD-10-CM | POA: Insufficient documentation

## 2020-10-02 DIAGNOSIS — Z96641 Presence of right artificial hip joint: Secondary | ICD-10-CM | POA: Diagnosis not present

## 2020-10-02 MED ORDER — KETOROLAC TROMETHAMINE 30 MG/ML IJ SOLN
15.0000 mg | Freq: Once | INTRAMUSCULAR | Status: AC
  Administered 2020-10-02: 15 mg via INTRAMUSCULAR
  Filled 2020-10-02: qty 1

## 2020-10-02 MED ORDER — PREDNISONE 20 MG PO TABS: 40.0000 mg | ORAL_TABLET | Freq: Every day | ORAL | 0 refills | Status: DC

## 2020-10-02 MED ORDER — METHYL SALICYLATE-LIDO-MENTHOL 4-4-5 % EX PTCH: 1.0000 | MEDICATED_PATCH | Freq: Two times a day (BID) | CUTANEOUS | 0 refills | Status: DC

## 2020-10-02 MED ORDER — MORPHINE SULFATE (PF) 4 MG/ML IV SOLN
4.0000 mg | Freq: Once | INTRAVENOUS | Status: AC
  Administered 2020-10-02: 4 mg via INTRAMUSCULAR
  Filled 2020-10-02: qty 1

## 2020-10-02 NOTE — ED Provider Notes (Signed)
[facility] EMERGENCY DEPARTMENT Provider Note   CSN: [id number] Arrival date & time: 10/02/20  1159     History No chief complaint on file.   Travis [name] is a 65 y.o. male.  HPI Patient with multiple medical issues presents with acute onset left-sided hip pain.  Yesterday he was walking on stairs when he felt sudden onset of pain in the left posterior superior lateral hip.  Since that time he has had pain with ambulation, weightbearing.  No medication taken for relief.  Pain is entirely left-sided, without abdominal, or thoracic complaints. No distal loss of sensation or weakness. Patient has a history of prior right hip replacement.    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 05/2019 Echo: EF 35-40%; b. 10/2019 Echo: EF 45-50%.   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 12/2010 Echo: EF 20-25%; b 08/2013 Echo: EF 45-50%; c. 01/2015 Echo: EF 25-30%; d. 07/2016 Echo: EF 35-40%; e. 05/2019 Echo: EF 35-40%; d. 10/2019 Echo: EF 45-50%, mild LVH, mild red RV fxn, mildl dil RA, Triv MR. Mild to mod Ao Sclerosis w/o stenosis.   Hyperlipidemia    Hypertension    Nonischemic cardiomyopathy (HCC)    Nonobstructive Coronary artery disease    a. 01/2015 Cath: LM nl, LAD 30p/m, LCX nl, RCA 10p/m, RPDA min irregs.   Osteoarthritis    Pneumonia    TIA (transient ischemic attack)    Tobacco abuse    Vision loss    peripherial vision only left eye.Central Retinal  artery occusion     Patient Active Problem List   Diagnosis Date Noted   Protein-calorie malnutrition, severe 05/27/2020   Hypotension 05/26/2020   AKI  (acute kidney injury) ([place]) 05/26/2020   Alcohol use 11/20/2019   CKD (chronic kidney disease), stage III ([place]) 11/20/2019   TIA (transient ischemic attack) 11/08/2019   Osteoarthritis 05/09/2016   COPD (chronic obstructive pulmonary disease) ([place]) 05/09/2016   Essential hypertension 04/18/2015   Literacy level of illiterate 06/04/2014   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   Cardiomyopathy, nonischemic ([place]) [id number]   Chronic systolic heart failure ([place]) 01/05/2011   Tobacco use 01/05/2011   Diabetes ([place]) 11/30/2008   HLD (hyperlipidemia) 11/30/2008   Gout 11/30/2008    Past Surgical History:  Procedure Laterality Date   BIOPSY  11/25/2019   Procedure: BIOPSY;  Surgeon: Ladene Artist, MD;  Location: [place];  Service: Endoscopy;;   CARDIAC CATHETERIZATION     CARDIAC CATHETERIZATION N/A 01/28/2015   Procedure: Left Heart Cath and Coronary Angiography;  Surgeon: Wellington Hampshire, MD;  Location: [place] [facility];  Service: Cardiovascular;  Laterality: N/A;   COLONOSCOPY     ESOPHAGOGASTRODUODENOSCOPY (EGD) WITH PROPOFOL N/A 11/25/2019   Procedure: ESOPHAGOGASTRODUODENOSCOPY (EGD) WITH PROPOFOL;  Surgeon: Ladene Artist, MD;  [facility] ENDOSCOPY;  Service: Endoscopy;  Laterality: N/A;   HIP ARTHROPLASTY Right 03/15/2013   Procedure: ARTHROPLASTY BIPOLAR HIP;  Surgeon: [name], MD;  Location: [facility]  OR;  Service: Orthopedics;  Laterality: Right;   TOTAL SHOULDER ARTHROPLASTY Right 09/01/2015   Procedure: RIGHT TOTAL SHOULDER ARTHROPLASTY;  Surgeon: [name], MD;  Location: [place];  Service: Orthopedics;  Laterality: Right;       Family History  Problem Relation Age of Onset   Diabetes Mother    Hypertension Mother    Diabetes Father    Hypertension Father    Diabetes Sister    Dementia Brother    Cancer Neg Hx    Heart disease Neg Hx    Stroke Neg Hx    Colon cancer Neg Hx    Esophageal cancer Neg Hx    Rectal cancer Neg Hx     Stomach cancer Neg Hx     Social History   Tobacco Use   Smoking status: Every Day    Packs/day: 0.50    Years: 30.00    Pack years: 15.00    Types: Cigarettes   Smokeless tobacco: Never  Vaping Use   Vaping Use: Never used  Substance Use Topics   Alcohol use: Not Currently    Alcohol/week: 1.0 standard drink    Types: 1 Glasses of wine per week    Comment: occasionally   Drug use: Yes    Types: Marijuana    Home Medications Prior to Admission medications   Medication Sig Start Date End Date Taking? Authorizing Provider  albuterol (VENTOLIN HFA) 108 (90 Base) MCG/ACT inhaler Inhale 2 puffs into the lungs every 6 (six) hours as needed for wheezing or shortness of breath.   Yes [provider]  allopurinol (ZYLOPRIM) 100 MG tablet Take 2 tablets (200 mg total) by mouth daily. Patient taking differently: Take 100 mg by mouth 2 (two) times daily. 08/30/20  Yes Dutch Quint B, FNP  aspirin 325 MG tablet Take 162.5 mg by mouth in the morning.   Yes [provider]  atorvastatin (LIPITOR) 20 MG tablet Take 20 mg by mouth daily.   Yes [provider]  carvedilol (COREG) 6.25 MG tablet Take 1 tablet (6.25 mg total) by mouth 2 (two) times daily. 06/03/20  Yes Theora Gianotti, NP  cetirizine (ZYRTEC) 10 MG tablet Take 1 tablet (10 mg total) by mouth daily. 07/07/20  Yes Baity, Coralie Keens, NP  fenofibrate (TRICOR) 145 MG tablet TAKE 1 TABLET BY MOUTH ONCE A DAY Patient taking differently: Take 145 mg by mouth daily. 05/13/20  Yes [name], NP  ferrous sulfate 325 (65 FE) MG tablet Take 1 tablet (325 mg total) by mouth daily with breakfast. 06/02/20  Yes Baity, Coralie Keens, NP  hydroxypropyl methylcellulose / hypromellose (ISOPTO TEARS / GONIOVISC) 2.5 % ophthalmic solution Place 1 drop into both eyes 3 (three) times daily as needed for dry eyes.   Yes [provider]  magnesium oxide (MAG-OX) 400 MG tablet Take 1 tablet (400 mg total) by mouth  daily. 09/07/20  Yes Dutch Quint B, FNP  Methyl Salicylate-Lido-Menthol 4-4-5 % PTCH Apply 1 patch topically in the morning and at bedtime. 10/02/20  Yes Carmin Muskrat, MD  multivitamin (ONE-A-DAY MEN'S) TABS tablet Take 1 tablet by mouth daily with breakfast.   Yes [provider]  Omega-3 Fatty Acids (FISH OIL PO) Take 1 capsule by mouth daily.   Yes [provider]  oxymetazoline (AFRIN) 0.05 % nasal spray Place 1 spray into both nostrils 2 (two) times daily as needed for congestion (IF 3 DAYS "ON," 3 DAYS "OFF").   Yes [provider]  pantoprazole (PROTONIX) 40 MG tablet Take 1 tablet (40 mg total) by mouth daily. Patient taking differently: Take 40 mg by mouth daily before breakfast. 06/02/20  Yes Baity, Coralie Keens, NP  predniSONE (DELTASONE) 20 MG tablet Take 2 tablets (40 mg total) by mouth daily with breakfast. For the next four days 10/02/20  Yes Carmin Muskrat, MD  spironolactone (ALDACTONE) 25 MG tablet Take 12.5 mg by mouth daily. 08/19/20  Yes [provider]  triamcinolone cream (KENALOG) 0.1 % APPLY TO AFFECTED AREAS TWICE A DAY AS NEEDED. AVOID FACE, GROIN, OR UNDERARMS. Patient taking differently: Apply 1 application topically 2 (two) times daily as needed (to affected areas, for itching/irritation- AVOID FACE, GROIN, AND UNDERARMS). 08/20/20  Yes Dutch Quint B, FNP  fluticasone (FLONASE) 50 MCG/ACT nasal spray Place 1-2 sprays into both nostrils daily as needed for allergies or rhinitis.    [provider]    Allergies    Patient has no known allergies.  Review of Systems   Review of Systems  Constitutional:        Per HPI, otherwise negative  HENT:         Per HPI, otherwise negative  Respiratory:         Per HPI, otherwise negative  Cardiovascular:        Per HPI, otherwise negative  Gastrointestinal:  Negative for vomiting.  Endocrine:       Negative aside from HPI  Genitourinary:        Neg aside from HPI    Musculoskeletal:        Per HPI, otherwise negative  Skin: Negative.   Neurological:  Negative for syncope.   Physical Exam Updated Vital Signs BP 133/83 (BP Location: Right Arm)   Pulse 73   Temp 97.6 F (36.4 C) (Oral)   Resp 12   SpO2 100%   Physical Exam Vitals and nursing note reviewed.  Constitutional:      General: He is not in acute distress.    Appearance: He is well-developed.  HENT:     Head: Normocephalic and atraumatic.  Eyes:     Conjunctiva/sclera: Conjunctivae normal.  Cardiovascular:     Rate and Rhythm: Normal rate and regular rhythm.  Pulmonary:     Effort: Pulmonary effort is normal. No respiratory distress.     Breath sounds: No stridor.  Abdominal:     General: There is no distension.  Musculoskeletal:     Comments: Right hip unremarkable, patient flexes the left hip to command, but does have some pain in the left lateral superior hip.  No deformity.  Knee unremarkable, ankle unremarkable.  Skin:    General: Skin is warm and dry.  Neurological:     Mental Status: He is alert and oriented to person, place, and time.    ED Results / Procedures / Treatments   Labs (all labs ordered are listed, but only abnormal results are displayed) Labs Reviewed - No data to display  EKG None  Radiology DG Hip Unilat W or Wo Pelvis 2-3 Views Left  Result Date: 10/02/2020 CLINICAL DATA:  Left hip pain 1 day as felt pop while walking. EXAM: DG HIP (WITH OR WITHOUT PELVIS) 2-3V LEFT COMPARISON:  03/14/2013 FINDINGS: Mild degenerative change over the left hip. No acute fracture or dislocation. Right hip arthroplasty intact. Heterotopic bone over the lateral soft tissues of the right hip. Degenerative change of the spine. IMPRESSION: 1. No acute findings. 2. Mild degenerative change of the left hip. Right  hip arthroplasty intact. Electronically Signed   By: Marin Olp M.D.   On: 10/02/2020 13:02    Procedures Procedures   Medications Ordered in  ED Medications  morphine 4 MG/ML injection 4 mg (4 mg Intramuscular Given 10/02/20 1332)  ketorolac (TORADOL) 30 MG/ML injection 15 mg (15 mg Intramuscular Given 10/02/20 1330)    ED Course  I have reviewed the triage vital signs and the nursing notes.  Pertinent labs & imaging results that were available during my care of the patient were reviewed by me and considered in my medical decision making (see chart for details).   Update:, Patient feeling better   4:43 PM Patient ambulatory, no new complaints. MDM Rules/Calculators/A&P L male presents with acute onset left hip pain.  Patient does have a history of right hemiarthroplasty, has been generally well.  Today, he presents after acute onset left hip pain.  Initially patient has hesitancy with motion, but is distally neurovascularly intact.  Patient had x-rays performed which were generally reassuring. After analgesia was provided the patient had substantial improvement in his condition. With no superficial changes, no fever low suspicion for septic arthritis, low suspicion for gout or other intra-articular pathology.  Some suspicion for musculoskeletal strain contributing to his episode.  Patient discharged to follow-up with his orthopedist. Final Clinical Impression(s) / ED Diagnoses Final diagnoses:  Left hip pain    Rx / DC Orders ED Discharge Orders          Ordered    predniSONE (DELTASONE) 20 MG tablet  Daily with breakfast        10/02/20 [id number]    Methyl Salicylate-Lido-Menthol 4-4-5 % PTCH  2 times daily        10/02/20 1643             Carmin Muskrat, MD 10/02/20 1644

## 2020-10-02 NOTE — Discharge Instructions (Addendum)
As discussed, your evaluation today has been largely reassuring.  But, it is important that you monitor your condition carefully, and do not hesitate to return to the ED if you develop new, or concerning changes in your condition. ? ?Otherwise, please follow-up with your physician for appropriate ongoing care. ? ?

## 2020-10-02 NOTE — ED Triage Notes (Signed)
Patient complains of left hip pain x 1 day. Denies trauma but reports felt pop while walking and now increased pain and soreness

## 2020-10-02 NOTE — ED Notes (Signed)
RN ambulated pt. Pt ambulated well. MD notified.

## 2020-10-02 NOTE — ED Notes (Signed)
Pt d/c home per MD order. Discharge summary reviewed with pt, pt verbalizes understanding. Ambulatory off unit. No s/s of acute distress noted. Discharge ride home here.

## 2020-10-03 ENCOUNTER — Ambulatory Visit: Payer: PRIVATE HEALTH INSURANCE | Admitting: Family

## 2020-10-20 ENCOUNTER — Telehealth: Payer: Medicare HMO

## 2020-10-20 ENCOUNTER — Telehealth: Payer: Self-pay

## 2020-10-20 NOTE — Progress Notes (Signed)
Patient ID: Travis [name], male    DOB: Aug 23, 1955, 65 y.o.   MRN: [id number]  HPI  [name] is a 65 y/o male with a history of osteoarthritis, hyperlipidemia, gout, HTN, GI bleed, DM, CAD, anemia, asthma, previous alcohol use, current tobacco use and chronic heart failure.  Echo report from 11/09/19 reviewed and showed an EF of 45-50% along with mild LVH. Echo report from 06/04/19 reviewed and showed an EF of 35-40% along with mild LVH. Reviewed last echo done 08/08/16 which showed an EF of 35-40%. This is an improvement from his last echo which was done 01/26/15 and showed an EF of 25-30% along with trivial MR/ TR. EF had dropped from 2015 when it was 45-50%.   Had a cardiac catheterization done 01/28/15 which showed 10% stenosis in Prox RCA to Mid RCA and 30% stenosis in Prox LAD to Mid LAD which is not much different from previous angiography. Cardiomyopathy felt to be due to previous alcohol use.   Was in the ED 10/02/20 due to left hip pain where he was evaluated and released. Patient was admitted to the hospital on 05/26/20 due to hypotension and atypical chest pain. Cardiology consult obtained. IVF given. Medications reduced. Discharged after 2 days.   He presents today for a follow-up visit with a chief complaint of minimal shortness of breath upon moderate exertion. He describes this as chronic in nature having been present for several years. He has associated gradual weight gain and chronic pain along with this. He denies any difficulty sleeping, dizziness, abdominal distention, palpitations, pedal edema, chest pain, cough or fatigue.   He says that he hasn't been as active because he had some hip pain a few weeks ago and has been "eating more" than he should. Entresto currently on hold due to worsening renal disease.   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 05/2019 Echo: EF 35-40%; b. 10/2019 Echo: EF 45-50%.   Allergy     Asthma    Central retinal artery occlusion of left eye 09/13/13   Chronic anemia    Clotting disorder ([name])    Diabetes mellitus, type 2 ([name])    pt reports his DM is gone   GI bleed    15 years ago   Gout    Hemoptysis    secondary to pulmonary edema   HFimpEF (heart failure with improved ejection fraction) ([name])    a. 12/2010 Echo: EF 20-25%; b 08/2013 Echo: EF 45-50%; c. 01/2015 Echo: EF 25-30%; d. 07/2016 Echo: EF 35-40%; e. 05/2019 Echo: EF 35-40%; d. 10/2019 Echo: EF 45-50%, mild LVH, mild red RV fxn, mildl dil RA, Triv MR. Mild to mod Ao Sclerosis w/o stenosis.   Hyperlipidemia    Hypertension    Nonischemic cardiomyopathy (HCC)    Nonobstructive Coronary artery disease    a. 01/2015 Cath: LM nl, LAD 30p/m, LCX nl, RCA 10p/m, RPDA min irregs.   Osteoarthritis    Pneumonia    TIA (transient ischemic attack)    Tobacco abuse    Vision loss    peripherial vision only left eye.Central Retinal  artery occusion    Past Surgical History:  Procedure Laterality Date   BIOPSY  11/25/2019   Procedure: BIOPSY;  Surgeon: Ladene Artist, MD;  Location: [facility] ENDOSCOPY;  Service: Endoscopy;;   CARDIAC CATHETERIZATION     CARDIAC CATHETERIZATION N/A 01/28/2015   Procedure: Left Heart Cath and Coronary  Angiography;  Surgeon: Wellington Hampshire, MD;  Location: [place] CV LAB;  Service: Cardiovascular;  Laterality: N/A;   COLONOSCOPY     ESOPHAGOGASTRODUODENOSCOPY (EGD) WITH PROPOFOL N/A 11/25/2019   Procedure: ESOPHAGOGASTRODUODENOSCOPY (EGD) WITH PROPOFOL;  Surgeon: Ladene Artist, MD;  Location: [facility] ENDOSCOPY;  Service: Endoscopy;  Laterality: N/A;   HIP ARTHROPLASTY Right 03/15/2013   Procedure: ARTHROPLASTY BIPOLAR HIP;  Surgeon: [name], MD;  Location: [place];  Service: Orthopedics;  Laterality: Right;   TOTAL SHOULDER ARTHROPLASTY Right 09/01/2015   Procedure: RIGHT TOTAL SHOULDER ARTHROPLASTY;  Surgeon: [name], MD;  Location: [name];  Service: Orthopedics;  Laterality:  Right;   Family History  Problem Relation Age of Onset   Diabetes Mother    Hypertension Mother    Diabetes Father    Hypertension Father    Diabetes Sister    Dementia Brother    Cancer Neg Hx    Heart disease Neg Hx    Stroke Neg Hx    Colon cancer Neg Hx    Esophageal cancer Neg Hx    Rectal cancer Neg Hx    Stomach cancer Neg Hx    Social History   Tobacco Use   Smoking status: Every Day    Packs/day: 0.50    Years: 30.00    Pack years: 15.00    Types: Cigarettes   Smokeless tobacco: Never  Substance Use Topics   Alcohol use: Not Currently    Alcohol/week: 1.0 standard drink    Types: 1 Glasses of wine per week    Comment: occasionally   No Known Allergies Past Medical History:  Diagnosis Date   Alcohol abuse    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 05/2019 Echo: EF 35-40%; b. 10/2019 Echo: EF 45-50%.   Allergy    Asthma    Central retinal artery occlusion of left eye 09/13/13   Chronic anemia    Clotting disorder ([name])    Diabetes mellitus, type 2 ([name])    pt reports his DM is gone   GI bleed    15 years ago   Gout    Hemoptysis    secondary to pulmonary edema   HFimpEF (heart failure with improved ejection fraction) ([name])    a. 12/2010 Echo: EF 20-25%; b 08/2013 Echo: EF 45-50%; c. 01/2015 Echo: EF 25-30%; d. 07/2016 Echo: EF 35-40%; e. 05/2019 Echo: EF 35-40%; d. 10/2019 Echo: EF 45-50%, mild LVH, mild red RV fxn, mildl dil RA, Triv MR. Mild to mod Ao Sclerosis w/o stenosis.   Hyperlipidemia    Hypertension    Nonischemic cardiomyopathy (HCC)    Nonobstructive Coronary artery disease    a. 01/2015 Cath: LM nl, LAD 30p/m, LCX nl, RCA 10p/m, RPDA min irregs.   Osteoarthritis    Pneumonia    TIA (transient ischemic attack)    Tobacco abuse    Vision loss    peripherial vision only left eye.Central Retinal  artery occusion    Past Surgical History:  Procedure Laterality Date   BIOPSY  11/25/2019   Procedure: BIOPSY;  Surgeon: Ladene Artist,  MD;  Location: [place];  Service: Endoscopy;;   CARDIAC CATHETERIZATION     CARDIAC CATHETERIZATION N/A 01/28/2015   Procedure: Left Heart Cath and Coronary Angiography;  Surgeon: Wellington Hampshire, MD;  Location: [place] CV LAB;  Service: Cardiovascular;  Laterality: N/A;   COLONOSCOPY     ESOPHAGOGASTRODUODENOSCOPY (EGD) WITH PROPOFOL N/A 11/25/2019   Procedure: ESOPHAGOGASTRODUODENOSCOPY (EGD) WITH  PROPOFOL;  Surgeon: Ladene Artist, MD;  Location: [facility] ENDOSCOPY;  Service: Endoscopy;  Laterality: N/A;   HIP ARTHROPLASTY Right 03/15/2013   Procedure: ARTHROPLASTY BIPOLAR HIP;  Surgeon: [name], MD;  Location: [place];  Service: Orthopedics;  Laterality: Right;   TOTAL SHOULDER ARTHROPLASTY Right 09/01/2015   Procedure: RIGHT TOTAL SHOULDER ARTHROPLASTY;  Surgeon: [name], MD;  Location: [place];  Service: Orthopedics;  Laterality: Right;   Family History  Problem Relation Age of Onset   Diabetes Mother    Hypertension Mother    Diabetes Father    Hypertension Father    Diabetes Sister    Dementia Brother    Cancer Neg Hx    Heart disease Neg Hx    Stroke Neg Hx    Colon cancer Neg Hx    Esophageal cancer Neg Hx    Rectal cancer Neg Hx    Stomach cancer Neg Hx    Social History   Tobacco Use   Smoking status: Every Day    Packs/day: 0.50    Years: 30.00    Pack years: 15.00    Types: Cigarettes   Smokeless tobacco: Never  Substance Use Topics   Alcohol use: Not Currently    Alcohol/week: 1.0 standard drink    Types: 1 Glasses of wine per week    Comment: occasionally   No Known Allergies  Prior to Admission medications   Medication Sig Start Date End Date Taking? Authorizing Provider  albuterol (VENTOLIN HFA) 108 (90 Base) MCG/ACT inhaler Inhale 2 puffs into the lungs every 6 (six) hours as needed for wheezing or shortness of breath.   Yes [provider]  allopurinol (ZYLOPRIM) 100 MG tablet Take 2 tablets (200 mg total) by mouth  daily. Patient taking differently: Take 100 mg by mouth 2 (two) times daily. 08/30/20  Yes Dutch Quint B, FNP  aspirin 325 MG tablet Take 162.5 mg by mouth in the morning.   Yes [provider]  atorvastatin (LIPITOR) 20 MG tablet Take 20 mg by mouth daily.   Yes [provider]  carvedilol (COREG) 6.25 MG tablet Take 1 tablet (6.25 mg total) by mouth 2 (two) times daily. 06/03/20  Yes Theora Gianotti, NP  cetirizine (ZYRTEC) 10 MG tablet Take 1 tablet (10 mg total) by mouth daily. 07/07/20  Yes Baity, Coralie Keens, NP  fenofibrate (TRICOR) 145 MG tablet TAKE 1 TABLET BY MOUTH ONCE A DAY Patient taking differently: Take 145 mg by mouth daily. 05/13/20  Yes [name], NP  ferrous sulfate 325 (65 FE) MG tablet Take 1 tablet (325 mg total) by mouth daily with breakfast. 06/02/20  Yes Baity, Coralie Keens, NP  fluticasone (FLONASE) 50 MCG/ACT nasal spray Place 1-2 sprays into both nostrils daily as needed for allergies or rhinitis.   Yes [provider]  hydroxypropyl methylcellulose / hypromellose (ISOPTO TEARS / GONIOVISC) 2.5 % ophthalmic solution Place 1 drop into both eyes 3 (three) times daily as needed for dry eyes.   Yes [provider]  magnesium oxide (MAG-OX) 400 MG tablet Take 1 tablet (400 mg total) by mouth daily. 09/07/20  Yes Dutch Quint B, FNP  multivitamin (ONE-A-DAY MEN'S) TABS tablet Take 1 tablet by mouth daily with breakfast.   Yes [provider]  Omega-3 Fatty Acids (FISH OIL PO) Take 1 capsule by mouth daily.   Yes [provider]  pantoprazole (PROTONIX) 40 MG tablet Take 1 tablet (40 mg total) by mouth daily. Patient  taking differently: Take 40 mg by mouth daily before breakfast. 06/02/20  Yes Baity, Coralie Keens, NP  spironolactone (ALDACTONE) 25 MG tablet Take 12.5 mg by mouth daily. 08/19/20  Yes [provider]  Methyl Salicylate-Lido-Menthol 4-4-5 % PTCH Apply 1 patch topically in the morning and at bedtime.  10/02/20   Carmin Muskrat, MD  oxymetazoline (AFRIN) 0.05 % nasal spray Place 1 spray into both nostrils 2 (two) times daily as needed for congestion (IF 3 DAYS "ON," 3 DAYS "OFF").    [provider]  triamcinolone cream (KENALOG) 0.1 % APPLY TO AFFECTED AREAS TWICE A DAY AS NEEDED. AVOID FACE, GROIN, OR UNDERARMS. Patient taking differently: Apply 1 application topically 2 (two) times daily as needed (to affected areas, for itching/irritation- AVOID FACE, GROIN, AND UNDERARMS). 08/20/20   Kennyth Arnold, FNP   Review of Systems  Constitutional:  Negative for appetite change and fatigue.  HENT:  Negative for congestion, postnasal drip and sore throat.   Eyes: Negative.   Respiratory:  Positive for shortness of breath (minimal). Negative for cough, chest tightness and wheezing.   Cardiovascular:  Negative for chest pain, palpitations and leg swelling.  Gastrointestinal:  Negative for abdominal distention and abdominal pain.  Endocrine: Negative.   Genitourinary: Negative.   Musculoskeletal:  Positive for arthralgias (knee pain) and back pain.  Skin: Negative.   Allergic/Immunologic: Negative.   Neurological:  Negative for dizziness and light-headedness.  Hematological:  Negative for adenopathy. Does not bruise/bleed easily.  Psychiatric/Behavioral:  Negative for dysphoric mood and sleep disturbance (sleeping on 2 pillows). The patient is not nervous/anxious.    Vitals:   10/21/20 1256  BP: 129/77  Pulse: 96  Resp: 18  SpO2: 97%  Weight: 191 lb (86.6 kg)  Height: '6\' 4"'$  (1.93 m)   Wt Readings from Last 3 Encounters:  10/21/20 191 lb (86.6 kg)  09/23/20 187 lb 6.4 oz (85 kg)  07/19/20 185 lb (83.9 kg)   Lab Results  Component Value Date   CREATININE 1.56 (H) 09/27/2020   CREATININE 2.06 (H) 09/23/2020   CREATININE 1.26 (H) 06/21/2020    Physical Exam Vitals reviewed.  Constitutional:      Appearance: Normal appearance.  HENT:     Head: Normocephalic and  atraumatic.  Cardiovascular:     Rate and Rhythm: Normal rate and regular rhythm.  Pulmonary:     Effort: Pulmonary effort is normal. No respiratory distress.     Breath sounds: No wheezing or rales.  Abdominal:     General: There is no distension.     Palpations: Abdomen is soft.     Tenderness: There is no abdominal tenderness.  Musculoskeletal:        General: No tenderness.     Cervical back: Normal range of motion and neck supple.     Right lower leg: No edema.     Left lower leg: No edema.  Skin:    General: Skin is warm and dry.  Neurological:     General: No focal deficit present.     Mental Status: He is alert and oriented to person, place, and time.  Psychiatric:        Mood and Affect: Mood normal.        Behavior: Behavior normal.        Thought Content: Thought content normal.     Assessment & Plan:  1: Chronic heart failure with reduced ejection fraction- - NYHA class II - euvolemic today - weighing daily & notes  gradual weight gain; reminded to call for an overnight weight gain of >2 pounds/weekly weight gain of >5 pounds.  - weight up 17 pounds since last visit here 6 months ago - says that he's not as active as he was due to previous hip pain along with chronic knee/ back pain; encouraged to resume walking if that doesn't bother his joints - trying to not add salt to his food but says that he's been eating "too much" recently - Drinking approximately 64 oz of fluid daily - saw cardiology Fletcher Anon) 07/19/20 - would like to resume entresto but will hold off until nephrology sees him - currently on GDMT of carvedilol & spionolactone - BNP 11/20/19 was 365.0  2: HTN- - BP looks good today - BMP from 09/27/20 reviewed and shows sodium 138, potassium 4.5, creatinine 1.56 and GFR 49 - saw his PCP 09/23/20 - has upcoming nephrology appointment in August  3: Tobacco use- - continues to smoke about 1 ppd of cigarettes  - complete cessation discussed for 3 minutes with  him  4: Alcohol use- - Reports that he drinks "a little bit" every now and then - unable to quantify what "a little bit" is but says that he drank some a few days ago when it snowed   Medication bottles reviewed.   Return in 3 months or sooner for any questions/problems before then.

## 2020-10-20 NOTE — Telephone Encounter (Signed)
  Care Management   Follow Up Note   10/20/2020 Name: KHRIS FRISCHMAN MRN: [id number] DOB: 05-21-1955   Referred by: [name], NP Reason for referral : Chronic Care Management (Anemia, HF)   An unsuccessful telephone outreach was attempted today. The patient was referred to the case management team for assistance with care management and care coordination.   Follow Up Plan: A HIPPA compliant phone message was left for the patient providing contact information and requesting a return call.   [name] RN,BSN,CCM RN Case Manager [place]  [phone]

## 2020-10-21 ENCOUNTER — Ambulatory Visit: Payer: Medicare HMO | Attending: Family | Admitting: Family

## 2020-10-21 ENCOUNTER — Other Ambulatory Visit: Payer: Self-pay

## 2020-10-21 ENCOUNTER — Encounter: Payer: Self-pay | Admitting: Family

## 2020-10-21 ENCOUNTER — Telehealth: Payer: Self-pay | Admitting: *Deleted

## 2020-10-21 VITALS — BP 129/77 | HR 96 | Resp 18 | Ht 76.0 in | Wt 191.0 lb

## 2020-10-21 DIAGNOSIS — I429 Cardiomyopathy, unspecified: Secondary | ICD-10-CM | POA: Insufficient documentation

## 2020-10-21 DIAGNOSIS — I5022 Chronic systolic (congestive) heart failure: Secondary | ICD-10-CM

## 2020-10-21 DIAGNOSIS — Z8249 Family history of ischemic heart disease and other diseases of the circulatory system: Secondary | ICD-10-CM | POA: Diagnosis not present

## 2020-10-21 DIAGNOSIS — E785 Hyperlipidemia, unspecified: Secondary | ICD-10-CM | POA: Diagnosis not present

## 2020-10-21 DIAGNOSIS — F17210 Nicotine dependence, cigarettes, uncomplicated: Secondary | ICD-10-CM | POA: Diagnosis not present

## 2020-10-21 DIAGNOSIS — I2510 Atherosclerotic heart disease of native coronary artery without angina pectoris: Secondary | ICD-10-CM | POA: Insufficient documentation

## 2020-10-21 DIAGNOSIS — J45909 Unspecified asthma, uncomplicated: Secondary | ICD-10-CM | POA: Diagnosis not present

## 2020-10-21 DIAGNOSIS — Z7289 Other problems related to lifestyle: Secondary | ICD-10-CM | POA: Diagnosis not present

## 2020-10-21 DIAGNOSIS — I10 Essential (primary) hypertension: Secondary | ICD-10-CM

## 2020-10-21 DIAGNOSIS — Z79899 Other long term (current) drug therapy: Secondary | ICD-10-CM | POA: Diagnosis not present

## 2020-10-21 DIAGNOSIS — E119 Type 2 diabetes mellitus without complications: Secondary | ICD-10-CM | POA: Insufficient documentation

## 2020-10-21 DIAGNOSIS — I110 Hypertensive heart disease with heart failure: Secondary | ICD-10-CM | POA: Diagnosis not present

## 2020-10-21 DIAGNOSIS — F1010 Alcohol abuse, uncomplicated: Secondary | ICD-10-CM

## 2020-10-21 DIAGNOSIS — Z720 Tobacco use: Secondary | ICD-10-CM

## 2020-10-21 NOTE — Patient Instructions (Signed)
Continue weighing daily and call for an overnight weight gain of > 2 pounds or a weekly weight gain of >5 pounds. 

## 2020-10-21 NOTE — Chronic Care Management (AMB) (Signed)
  Care Management   Note  10/21/2020 Name: Travis [name] MRN: [id number] DOB: 1955/06/10  Travis [name] is a 65 y.o. year old male who is a primary care patient of [name], NP and is actively engaged with the care management team. I reached out to [name] by phone today to assist with re-scheduling a follow up visit with the RN Case Manager  Follow up plan: Telephone appointment with care management team member scheduled for: 10/25/2020  Julian Hy, [place], [place] Management  Direct Dial: [phone]

## 2020-10-21 NOTE — Chronic Care Management (AMB) (Signed)
  Care Management   Note  10/21/2020 Name: Travis [name] MRN: [id number] DOB: [birth year]  [name] is a 65 y.o. year old male who is a primary care patient of Garnette Gunner, Coralie Keens, NP and is actively engaged with the care management team. I reached out to [name] by phone today to assist with re-scheduling a follow up visit with the RN Case Manager  Follow up plan: Unsuccessful telephone outreach attempt made. A HIPAA compliant phone message was left for the patient providing contact information and requesting a return call.   Julian Hy, [place] Management  Direct Dial: [phone]

## 2020-10-25 ENCOUNTER — Ambulatory Visit (INDEPENDENT_AMBULATORY_CARE_PROVIDER_SITE_OTHER): Payer: Medicare HMO

## 2020-10-25 DIAGNOSIS — D509 Iron deficiency anemia, unspecified: Secondary | ICD-10-CM | POA: Diagnosis not present

## 2020-10-25 DIAGNOSIS — I5022 Chronic systolic (congestive) heart failure: Secondary | ICD-10-CM

## 2020-10-25 NOTE — Patient Instructions (Signed)
Visit Information: Thank you for taking the time to speak with me today.   PATIENT GOALS:  Goals Addressed             This Visit's Progress    Manage anemia   On track    Timeframe:  Long-Range Goal Priority:  Medium Start Date:  06/06/2020                           Expected End Date:   01/23/2021  Follow up:  11/29/2020  Patient goals: - Continue to eat iron rich foods such as: lean meats and fish; Skylan Gift leafy vegetables, such as kale and spinach; Jamesha Ellsworth beans, eggs; dried fruits, such as dates and figs; broccoli. Increase your fruits, vegetables, and water to help with constipation.  Inquire with your doctor or pharmacist about over the counter stool softener or mild laxative.   - Continue to take your medications as prescribed. Refill your medications timely.  - Contact your doctor for new or concerning symptoms.  - Follow up with Hematology specialist as advised.  - Continue to Avoid alcohol -  Continue to monitor for bleeding from nose, gums, in urine or stool to provider and report these symptoms to your doctor as soon as possible.   - Keep new appointment with nephrologist scheduled for 11/24/2020 at 11:40am with Dr. Lanora Manis.  Arrive at least 15 minutes before scheduled appointment.      Track and Manage  heart failure Symptoms   On track    Timeframe:  Long-Range Goal Priority:  Medium Start Date: 04/27/2020                         Expected End Date:  01/23/2021                 Follow Up Date 11/29/2020   -  continue to take your medications as prescribed. Refill your prescriptions timely. - Continue to weigh daily and record (notifying MD of 3 lb weight gain over night or 5 lb in a week) - Continue to follow a low salt diet - Continue to eat more whole grains, fruits and vegetables, lean meats and healthy fats - follow rescue plan for symptoms flare-up - Continue to keep your follow up appointments with your doctors. New primary care provider appointment with Rulon Abide, NP on 09/13/2020 at 10:30am - Continue to watch for swelling in feet, ankles and legs every day. (Report these symptoms to your doctor) - Remember to schedule appointment for COVID 19 booster.  - Continue to work on decreasing the number of cigarettes per day. Your RN case manager will continue to support you regarding smoking cessation. Contact your RN case manager if you needs assistance.   Why is this important?   You will be able to handle your symptoms better if you keep track of them.  Making some simple changes to your lifestyle will help.  Eating healthy is one thing you can do to take good care of yourself.              Patient verbalizes understanding of instructions provided today and agrees to view in [place].   The patient has been provided with contact information for the care management team and has been advised to call with any health related questions or concerns.  The care management team will reach out to the patient again over the next 45 days.  [name] RN,BSN,CCM RN Case Manager [place]  [phone]

## 2020-10-25 NOTE — Chronic Care Management (AMB) (Signed)
Chronic Care Management   CCM RN Visit Note  10/25/2020 Name: Travis [name] MRN: [id number] DOB: Nov 03, 1955  Subjective: Travis [name] is a 65 y.o. year old male who is a primary care patient of Travis Fenton, NP. The care management team was consulted for assistance with disease management and care coordination needs.    Engaged with patient by telephone for follow up visit in response to provider referral for case management and/or care coordination services.   Consent to Services:  The patient was given information about Chronic Care Management services, agreed to services, and gave verbal consent prior to initiation of services.  Please see initial visit note for detailed documentation.   Patient agreed to services and verbal consent obtained.   Assessment: Review of patient past medical history, allergies, medications, health status, including review of consultants reports, laboratory and other test data, was performed as part of comprehensive evaluation and provision of chronic care management services.   SDOH (Social Determinants of Health) assessments and interventions performed:    CCM Care Plan  No Known Allergies  Outpatient Encounter Medications as of 10/25/2020  Medication Sig Note   albuterol (VENTOLIN HFA) 108 (90 Base) MCG/ACT inhaler Inhale 2 puffs into the lungs every 6 (six) hours as needed for wheezing or shortness of breath.    allopurinol (ZYLOPRIM) 100 MG tablet Take 2 tablets (200 mg total) by mouth daily. (Patient taking differently: Take 100 mg by mouth 2 (two) times daily.)    aspirin 325 MG tablet Take 162.5 mg by mouth in the morning.    atorvastatin (LIPITOR) 20 MG tablet Take 20 mg by mouth daily.    carvedilol (COREG) 6.25 MG tablet Take 1 tablet (6.25 mg total) by mouth 2 (two) times daily.    cetirizine (ZYRTEC) 10 MG tablet Take 1 tablet (10 mg total) by mouth daily.    fenofibrate (TRICOR) 145 MG tablet TAKE 1 TABLET BY MOUTH ONCE A DAY (Patient  taking differently: Take 145 mg by mouth daily.)    ferrous sulfate 325 (65 FE) MG tablet Take 1 tablet (325 mg total) by mouth daily with breakfast.    fluticasone (FLONASE) 50 MCG/ACT nasal spray Place 1-2 sprays into both nostrils daily as needed for allergies or rhinitis. 10/02/2020: Patient was unsure if he still uses this    hydroxypropyl methylcellulose / hypromellose (ISOPTO TEARS / GONIOVISC) 2.5 % ophthalmic solution Place 1 drop into both eyes 3 (three) times daily as needed for dry eyes.    magnesium oxide (MAG-OX) 400 MG tablet Take 1 tablet (400 mg total) by mouth daily.    Methyl Salicylate-Lido-Menthol 4-4-5 % PTCH Apply 1 patch topically in the morning and at bedtime.    multivitamin (ONE-A-DAY MEN'S) TABS tablet Take 1 tablet by mouth daily with breakfast.    Omega-3 Fatty Acids (FISH OIL PO) Take 1 capsule by mouth daily.    oxymetazoline (AFRIN) 0.05 % nasal spray Place 1 spray into both nostrils 2 (two) times daily as needed for congestion (IF 3 DAYS "ON," 3 DAYS "OFF").    pantoprazole (PROTONIX) 40 MG tablet Take 1 tablet (40 mg total) by mouth daily. (Patient taking differently: Take 40 mg by mouth daily before breakfast.)    spironolactone (ALDACTONE) 25 MG tablet Take 12.5 mg by mouth daily.    triamcinolone cream (KENALOG) 0.1 % APPLY TO AFFECTED AREAS TWICE A DAY AS NEEDED. AVOID FACE, GROIN, OR UNDERARMS. (Patient taking differently: Apply 1 application topically 2 (two) times daily  as needed (to affected areas, for itching/irritation- AVOID FACE, GROIN, AND UNDERARMS).)    No facility-administered encounter medications on file as of 10/25/2020.    Patient Active Problem List   Diagnosis Date Noted   Protein-calorie malnutrition, severe 05/27/2020   Hypotension 05/26/2020   AKI (acute kidney injury) ([place]) 05/26/2020   Alcohol use 11/20/2019   CKD (chronic kidney disease), stage III ([place]) 11/20/2019   TIA (transient ischemic attack) 11/08/2019   Osteoarthritis  05/09/2016   COPD (chronic obstructive pulmonary disease) ([place]) 05/09/2016   Essential hypertension 04/18/2015   Literacy level of illiterate 06/04/2014   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   Cardiomyopathy, nonischemic ([place]) [id number]   Chronic systolic heart failure ([place]) 01/05/2011   Tobacco use 01/05/2011   Diabetes ([place]) 11/30/2008   HLD (hyperlipidemia) 11/30/2008   Gout 11/30/2008    Conditions to be addressed/monitored:CHF and anemia  Care Plan : Heart Failure ( Adult)  Updates made by Dannielle Karvonen, RN since 10/25/2020 12:00 AM     Problem: Symptom Exacerbation (Heart Failure)   Priority: Medium     Long-Range Goal: Patient will minimize heart failure exacerbation   Start Date: 04/27/2020  Expected End Date: 01/23/2021  Recent Progress: On track  Priority: Medium  Note:   Current Barriers: Patient denies any symptoms of shortness of breath, swelling or weight gain.   Patient reports being in the Travis [name] zone of his heart failure action plan.   Patient states he is scheduled to see his new primary care provider on 11/21/2020 by video visit.   Knowledge deficit related to basic heart failure pathophysiology and self care management:   Literacy Barriers: patient reports his niece assists him with reading / understanding medical documentation.  Case Manager Clinical Goal(s):  Patient will verbalize understanding of Heart Failure Action Plan and when to call doctor Patient will weigh daily and record (notifying MD of 3 lb weight gain over night or 5 lb in a week) Interventions:  Collaboration with Travis Fenton, NP regarding development and update of comprehensive plan of care as evidenced by provider attestation and co-signature Inter-disciplinary care team collaboration (see longitudinal plan of care) Basic overview and discussion of pathophysiology of Heart Failure reviewed  Provided verbal education on low sodium diet Reviewed Heart Failure Action Plan in  depth. Reviewed Heart failure action plan with patient.  Discussed importance of daily weight and advised patient to weigh and record daily medication-adherence assessment completed:  Patient states he has all of his medications and is taking them as prescribed.   self-awareness of signs/symptoms of worsening disease encouraged:   Reviewed provider appointments with patient. 11/18/2020.  Patient advised to scheduled COVID 19 booster:   Smoking cessation discussed:  Patient reports he continues to smoke approximately 1 pack per day.  Patient states he is not ready for assistance with smoking cessation at this time. RNCM continues provide ongoing encouragement and support.   Patient Goals/Self-Care Activities -  continue to take your medications as prescribed. Refill your prescriptions timely. - Continue to weigh daily and record (notifying MD of 3 lb weight gain over night or 5 lb in a week) - Continue to follow a low salt diet - Continue to eat more whole grains, fruits and vegetables, lean meats and healthy fats - follow rescue plan for symptoms flare-up - Continue to keep your follow up appointments with your doctors. New primary care provider appointment with Rulon Abide, NP on 09/13/2020 at 10:30am - Continue to watch for swelling  in feet, ankles and legs every day. (Report these symptoms to your doctor) - Remember to schedule appointment for COVID 19 booster.  - Continue to work on decreasing the number of cigarettes per day. Your RN case manager will continue to support you regarding smoking cessation. Contact your RN case manager if you needs assistance.   Follow Up Plan: The patient has been provided with contact information for the care management team and has been advised to call with any health related questions or concerns.  The care management team will reach out to the patient again over the next 45 days.      Care Plan : Anemia  Updates made by Dannielle Karvonen, RN since  10/25/2020 12:00 AM     Problem: Knowledge deficit related to long term health management of anemia   Priority: Medium     Long-Range Goal: Anemia Complications Prevented or Managed   Start Date: 06/06/2020  Expected End Date: 01/23/2021  Recent Progress: On track  Priority: Medium  Note:   Current Barriers: Patient states he is having fatigue.  He reports he has not been as active lately.   Patient states he continues to take his medications as prescribed and continues to maintain a good appetite and supplements his diet with ensure or boost. Patient states his oncology office scheduled him to see a nephrologist. He states he has missed place the appointment time / date.  Knowledge deficits related to self health management of anemia or bleeding:   Literacy barriers:  Patient reports his niece assists him with reading/ understanding medical documentation. Nurse Case Manager Clinical Goal(s):  patient will take all medications  as prescribed and will call provider for medication related questions patient will verbalize basic understanding of anemia disease process and self health management plan. Interventions:  Collaboration with Travis Fenton, NP regarding development and update of comprehensive plan of care as evidenced by provider attestation and co-signature Inter-disciplinary care team collaboration (see longitudinal plan of care):  Patient scheduled with new primary care provider, Laverna Peace on 11/21/2020.  Informed patient he will transition the new RNCM covering the practice once he is established with new provider.  Basic overview and discussion of anemia/bleeding disorder or acute disease state Medications reviewed  Encouraged to maintain sobriety:   encouraged optimal oral intake to support fluid balance and nutrition:   encouraged dietary changes to increase dietary intake of iron, Vitamin 123456 and folic acid as advised/prescribed Increase your activity as tolerated and as  advised by your doctor  Advised to keep provider appointments as advised:   Patient reports his next oncology visit is 11/18/20.  RNCM contacted patient's oncology office and spoke with Calesha.  She states patient has an appointment with Dr. Lanora Manis for 11/24/2020 at 11:40am at the [facility]. RNCM contacted patient to provide provider name, date and time for nephology appointment. Patient expressed appreciation. Patient states he has transportation to his appointment.  Patient Goals/Self-Care Activities: - Continue to eat iron rich foods such as: lean meats and fish; Keina Mutch leafy vegetables, such as kale and spinach; Kaniya Trueheart beans, eggs; dried fruits, such as dates and figs; broccoli. Increase your fruits, vegetables, and water to help with constipation.  Inquire with your doctor or pharmacist about over the counter stool softener or mild laxative.   - Continue to take your medications as prescribed. Refill your medications timely.  - Contact your doctor for new or concerning symptoms.  - Follow up with Hematology specialist as  advised.  - Continue to Avoid alcohol -  Continue to monitor for bleeding from nose, gums, in urine or stool to provider and report these symptoms to your doctor as soon as possible.   - Keep new appointment with nephrologist scheduled for 11/24/2020 at 11:40am with Dr. Lanora Manis.  Arrive at least 15 minutes before scheduled appointment.  Follow Up Plan: The patient has been provided with contact information for the care management team and has been advised to call with any health related questions or concerns.  The care management team will reach out to the patient again over the next 45 days.              Plan:The patient has been provided with contact information for the care management team and has been advised to call with any health related questions or concerns.  and The care management team will reach out to the patient again over the next 45  days. [name] RN,BSN,CCM RN Case Manager [place]  [phone]

## 2020-10-27 ENCOUNTER — Other Ambulatory Visit: Payer: Self-pay | Admitting: Family

## 2020-11-01 NOTE — Telephone Encounter (Signed)
  Encourage patient to contact the pharmacy for refills or they can request refills through [place]:  Please schedule appointment if longer than 1 year  NEXT APPOINTMENT DATE: 11/21/2020  MEDICATION: triamcinolone cream (KENALOG) 0.1 %  Is the patient out of medication? yes  PHARMACY: [facility]  Let patient know to contact pharmacy at the end of the day to make sure medication is ready.  Please notify patient to allow 48-72 hours to process  CLINICAL FILLS OUT ALL BELOW:   LAST REFILL:  QTY:  REFILL DATE:    OTHER COMMENTS:    Okay for refill?  Please advise

## 2020-11-01 NOTE — Telephone Encounter (Signed)
Last office visit 06/02/2020 for hospital follow up with R. Baity.   Last refilled 08/20/2020 for 454 g with 1 refill.  No TOC appointments.

## 2020-11-16 NOTE — Progress Notes (Signed)
Cardiology Office Note    Date:  11/18/2020   ID:  [name], DOB Nov 16, 1955, MRN [id number]  PCP:  Travis Fenton, NP  Cardiologist:  Travis Sacramento, MD  Electrophysiologist:  None   Chief Complaint: Follow-up  History of Present Illness:   Travis [name] is a 65 y.o. male with history of CAD, alcoholic cardiomyopathy with HFrEF with an LVEF of 45 to 50%, mild LVH, trivial mitral regurgitation by echo in 10/2019, DM2, TIA, CKD stage III, chronic anemia with pancytopenia, HTN, HLD, gastric ulcer disease with prior GI bleed, and alcohol/tobacco use who presents for follow-up of his cardiomyopathy.   His cardiac history dates back to at least 2012 with chart indicating an echo at that time showed an EF of 20 to 25% with global hypokinesis.  Repeat echo in 2013 showed an EF of 50 to 55%.  Echo in 01/2015 showed an EF of 25 to 30% which was a decrease from prior echo in 2015 with an EF of 45 to 50%.  In 01/2015, he underwent diagnostic LHC which showed an occluded proximal RCA to mid RCA along with 30% stenosis of the proximal to mid LAD which was largely unchanged from prior angiography.  His cardiomyopathy was felt to be due to prior alcohol use.  Over the years he has had multiple hospital admissions which have included titration of medications related to hypotension and presyncope.  He was seen in the [facility] CHF clinic in 03/2020 and felt to be euvolemic with no medication changes being made.  He was seen in our office in early 05/2020 and was found to be hypotensive with BP 74/50 with complaints of dizziness.  He was sent to the ED for evaluation of symptomatic hypotension.  He reported a 15 pound weight loss since 02/2020 and had a poor appetite.  It was noted he had previously stopped drinking though had restarted more recently.  In the ED he was treated with IV fluids.  High-sensitivity troponin negative x2.  He was noted to have AKI with an initial serum creatinine 2.37 subsequently  improving with hydration.  Entresto and spironolactone were held in the setting of hypotension with AKI.  During his admission, his BP remained soft leading to the continued holding of Entresto, spironolactone, and beta-blocker.  At discharge, it was recommended carvedilol 3.125 mg twice daily be resumed.  He was seen in hospital follow-up on 06/03/2020 and reported he had been feeling well since his hospital discharge.  Upon reviewing his home medications it was noted he had been taking carvedilol 6.25 mg twice daily with continued soft BP at 110/80.  He had discontinued alcohol again and reported improved appetite.  Given borderline soft BP escalation of GDMT was deferred.  He was felt to be euvolemic.  [name] was subsequently resumed in follow-up.  He was last seen in 06/2020 and was doing well from a cardiac perspective.  Primary cardiologist did not recommend resumption of spironolactone due to concerns for volume depletion.  Subsequent labs obtained in 09/2020 (which were recommended in the spring 2022) showed a decline in his renal function with a serum creatinine of 2.06 with baseline running around 1.2-1.5.  It was recommended Entresto be held at that time.  He was referred to nephrology.  He comes in doing well from a cardiac perspective.  No angina, dyspnea, palpitations, dizziness, presyncope, or syncope.  He notes his dizziness improved following the discontinuation of Entresto.  Somehow, he got restarted on spironolactone.  No lower extremity swelling, abdominal distention, or orthopnea.  His weight remains stable.  He continues to abstain from alcohol.  He does continue to smoke 1 pack/day and is not yet ready to quit.  No falls, hematochezia, or melena.  He follows up with nephrology next week.  Sadly, he did lose his brother earlier this week, though he is in good spirits.  His appetite remains good.  He does not have any issues or concerns at this time.   Labs independently reviewed: 09/2020 -  Hgb 11.6, PLT 242, potassium 4.5, BUN 29, serum creatinine 1.56 05/2020 - albumin 3.6, AST/ALT normal, A1c 6.4 11/2019 - TSH normal 10/2019 - TC 150, TG 77, HDL 47, LDL 88  Past Medical History:  Diagnosis Date   Alcohol abuse    Alcoholic cardiomyopathy ([place]) 03/15/2013   a. 05/2019 Echo: EF 35-40%; b. 10/2019 Echo: EF 45-50%.   Allergy    Asthma    Central retinal artery occlusion of left eye 09/13/13   Chronic anemia    Clotting disorder ([place])    Diabetes mellitus, type 2 ([place])    pt reports his DM is gone   GI bleed    15 years ago   Gout    Hemoptysis    secondary to pulmonary edema   HFimpEF (heart failure with improved ejection fraction) ([place])    a. 12/2010 Echo: EF 20-25%; b 08/2013 Echo: EF 45-50%; c. 01/2015 Echo: EF 25-30%; d. 07/2016 Echo: EF 35-40%; e. 05/2019 Echo: EF 35-40%; d. 10/2019 Echo: EF 45-50%, mild LVH, mild red RV fxn, mildl dil RA, Triv MR. Mild to mod Ao Sclerosis w/o stenosis.   Hyperlipidemia    Hypertension    Nonischemic cardiomyopathy (HCC)    Nonobstructive Coronary artery disease    a. 01/2015 Cath: LM nl, LAD 30p/m, LCX nl, RCA 10p/m, RPDA min irregs.   Osteoarthritis    Pneumonia    TIA (transient ischemic attack)    Tobacco abuse    Vision loss    peripherial vision only left eye.Central Retinal  artery occusion     Past Surgical History:  Procedure Laterality Date   BIOPSY  11/25/2019   Procedure: BIOPSY;  Surgeon: Travis Artist, MD;  Location: [facility];  Service: Endoscopy;;   CARDIAC CATHETERIZATION     CARDIAC CATHETERIZATION N/A 01/28/2015   Procedure: Left Heart Cath and Coronary Angiography;  Surgeon: [place] Hampshire, MD;  Location: [facility];  Service: Cardiovascular;  Laterality: N/A;   COLONOSCOPY     ESOPHAGOGASTRODUODENOSCOPY (EGD) WITH PROPOFOL N/A 11/25/2019   Procedure: ESOPHAGOGASTRODUODENOSCOPY (EGD) WITH PROPOFOL;  Surgeon: [place] Artist, MD;  Location: [facility] ENDOSCOPY;  Service: Endoscopy;  Laterality: N/A;    HIP ARTHROPLASTY Right 03/15/2013   Procedure: ARTHROPLASTY BIPOLAR HIP;  Surgeon: Travis Rossetti, MD;  Location: [facility];  Service: Orthopedics;  Laterality: Right;   TOTAL SHOULDER ARTHROPLASTY Right 09/01/2015   Procedure: RIGHT TOTAL SHOULDER ARTHROPLASTY;  Surgeon: [name], MD;  Location: [place];  Service: Orthopedics;  Laterality: Right;    Current Medications: Current Meds  Medication Sig   albuterol (VENTOLIN HFA) 108 (90 Base) MCG/ACT inhaler Inhale 2 puffs into the lungs every 6 (six) hours as needed for wheezing or shortness of breath.   allopurinol (ZYLOPRIM) 100 MG tablet Take 2 tablets (200 mg total) by mouth daily. (Patient taking differently: Take 100 mg by mouth 2 (two) times daily.)   aspirin 325 MG tablet Take 162.5 mg by mouth in  the morning.   atorvastatin (LIPITOR) 20 MG tablet Take 20 mg by mouth daily.   carvedilol (COREG) 6.25 MG tablet Take 1 tablet (6.25 mg total) by mouth 2 (two) times daily.   cetirizine (ZYRTEC) 10 MG tablet Take 1 tablet (10 mg total) by mouth daily.   fenofibrate (TRICOR) 145 MG tablet TAKE 1 TABLET BY MOUTH ONCE A DAY   ferrous sulfate 325 (65 FE) MG tablet Take 1 tablet (325 mg total) by mouth daily with breakfast.   fluticasone (FLONASE) 50 MCG/ACT nasal spray Place 1-2 sprays into both nostrils daily as needed for allergies or rhinitis.   hydroxypropyl methylcellulose / hypromellose (ISOPTO TEARS / GONIOVISC) 2.5 % ophthalmic solution Place 1 drop into both eyes 3 (three) times daily as needed for dry eyes.   losartan (COZAAR) 25 MG tablet Take 0.5 tablets (12.5 mg total) by mouth daily.   magnesium oxide (MAG-OX) 400 MG tablet Take 1 tablet (400 mg total) by mouth daily.   Methyl Salicylate-Lido-Menthol 4-4-5 % PTCH Apply 1 patch topically in the morning and at bedtime.   multivitamin (ONE-A-DAY MEN'S) TABS tablet Take 1 tablet by mouth daily with breakfast.   Omega-3 Fatty Acids (FISH OIL PO) Take 1 capsule by mouth daily.    oxymetazoline (AFRIN) 0.05 % nasal spray Place 1 spray into both nostrils 2 (two) times daily as needed for congestion (IF 3 DAYS "ON," 3 DAYS "OFF").   pantoprazole (PROTONIX) 40 MG tablet Take 1 tablet (40 mg total) by mouth daily.   triamcinolone cream (KENALOG) 0.1 % APPLY TO AFFECTED AREAS TWICE A DAY AS NEEDED. AVOID FACE, GROIN, OR UNDERARMS.   [DISCONTINUED] spironolactone (ALDACTONE) 25 MG tablet Take 12.5 mg by mouth daily.    Allergies:   Patient has no known allergies.   Social History   Socioeconomic History   Marital status: Single    Spouse name: Not on file   Number of children: 0   Years of education: 12   Highest education level: High school graduate  Occupational History   Occupation: Retired  Tobacco Use   Smoking status: Every Day    Packs/day: 0.50    Years: 30.00    Pack years: 15.00    Types: Cigarettes   Smokeless tobacco: Never  Vaping Use   Vaping Use: Never used  Substance and Sexual Activity   Alcohol use: Not Currently    Alcohol/week: 1.0 standard drink    Types: 1 Glasses of wine per week    Comment: occasionally   Drug use: Yes    Types: Marijuana   Sexual activity: Yes    Birth control/protection: Condom  Other Topics Concern   Not on file  Social History Narrative   Not on file   Social Determinants of Health   Financial Resource Strain: Low Risk    Difficulty of Paying Living Expenses: Not hard at all  Food Insecurity: No Food Insecurity   Worried About Charity fundraiser in the Last Year: Never true   Ran Out of Food in the Last Year: Never true  Transportation Needs: No Transportation Needs   Lack of Transportation (Medical): No   Lack of Transportation (Non-Medical): No  Physical Activity: Inactive   Days of Exercise per Week: 0 days   Minutes of Exercise per Session: 0 min  Stress: No Stress Concern Present   Feeling of Stress : Only a little  Social Connections: Socially Isolated   Frequency of Communication with  Friends and Family: More  than three times a week   Frequency of Social Gatherings with Friends and Family: More than three times a week   Attends Religious Services: Never   Marine scientist or Organizations: No   Attends Music therapist: Never   Marital Status: Never married     Family History:  The patient's family history includes Dementia in his brother; Diabetes in his father, mother, and sister; Hypertension in his father and mother. There is no history of Cancer, Heart disease, Stroke, Colon cancer, Esophageal cancer, Rectal cancer, or Stomach cancer.  ROS:   Review of Systems  Constitutional:  Positive for malaise/fatigue. Negative for chills, diaphoresis, fever and weight loss.  HENT:  Negative for congestion.   Eyes:  Negative for discharge and redness.  Respiratory:  Negative for cough, hemoptysis, sputum production, shortness of breath and wheezing.   Cardiovascular:  Negative for chest pain, palpitations, orthopnea, claudication, leg swelling and PND.  Gastrointestinal:  Negative for abdominal pain, blood in stool, heartburn, melena, nausea and vomiting.  Genitourinary:  Negative for hematuria.  Musculoskeletal:  Negative for falls and myalgias.  Skin:  Negative for rash.  Neurological:  Negative for dizziness, tingling, tremors, sensory change, speech change, focal weakness, loss of consciousness and weakness.  Endo/Heme/Allergies:  Does not bruise/bleed easily.  Psychiatric/Behavioral:  Negative for substance abuse. The patient is not nervous/anxious.   All other systems reviewed and are negative.   EKGs/Labs/Other Studies Reviewed:    Studies reviewed were summarized above. The additional studies were reviewed today:  2D echo 10/2019:  1. Compared to echo done in March 2021 EF improved and thickening of MV  and subchordal apparatus more prominent.   2. Left ventricular ejection fraction, by estimation, is 45 to 50%. The  left ventricle has mildly  decreased function. The left ventricle  demonstrates regional wall motion abnormalities (see scoring  diagram/findings for description). There is mild left  ventricular hypertrophy. Left ventricular diastolic parameters are  indeterminate.   3. Enlarged basal annulus . Right ventricular systolic function is mildly  reduced. The right ventricular size is mildly enlarged.   4. Right atrial size was mildly dilated.   5. MV is thickened cannot r/o SBE but likely reducndant sub chordal  apparatus consider TEE to further evaluate if suspicion of SBE high . The  mitral valve is abnormal. Trivial mitral valve regurgitation. No evidence  of mitral stenosis.   6. The aortic valve was not well visualized. Aortic valve regurgitation  is not visualized. Mild to moderate aortic valve sclerosis/calcification  is present, without any evidence of aortic stenosis.   7. The inferior vena cava is normal in size with greater than 50%  respiratory variability, suggesting right atrial pressure of 3 mmHg.  __________  2D echo 05/2019: 1. Left ventricular ejection fraction, by estimation, is 35 to 40%. The  left ventricle has moderately decreased function. The left ventricle  demonstrates global hypokinesis. The left ventricular internal cavity size  was mildly dilated. There is mild  left ventricular hypertrophy. Left ventricular diastolic parameters are  consistent with Grade I diastolic dysfunction (impaired relaxation).   2. Right ventricular systolic function is normal. The right ventricular  size is normal. Tricuspid regurgitation signal is inadequate for assessing  PA pressure. __________   2D echo 07/2016: - Left ventricle: The cavity size was normal. There was mild    concentric hypertrophy. Systolic function was moderately reduced.    The estimated ejection fraction was in the range of 35% to  40%.    Regional wall motion abnormalities cannot be excluded. Doppler    parameters are consistent with  abnormal left ventricular    relaxation (grade 1 diastolic dysfunction).  - Mitral valve: Mild prolapse. There was trivial regurgitation.  - Right ventricle: The cavity size was normal. Systolic function    was normal. __________   LHC 01/2015: Prox RCA to Mid RCA lesion, 10% stenosed. Prox LAD to Mid LAD lesion, 30% stenosed.   1. Mild one-vessel coronary artery disease which does not seem to be different from most recent coronary angiography. 2. Moderate to severely reduced LV systolic function by noninvasive angiography. 3. Mildly elevated left ventricular end-diastolic pressure.   Recommendations: The patient has nonischemic cardiomyopathy likely alcohol induced with poor adherence to medical therapy. Continue medical therapy. __________   [facility] Adam MPI 01/2015: Defect 1: There is a medium defect of moderate severity present in the mid anterior, mid anterolateral, apical anterior and apex location. The defect is partially reversible. This could be due to an artifact. However, previous infarct with moderate peri-infarct ischemia cannot be excluded. ST segment depression was noted during stress. This was nondiagnostic due to abnormal baseline. This is a high risk study mainly due to severely reduced ejection fraction Nuclear stress EF: 28%. this study is overall very suboptimal due to intense GI activity interfering with the inferior wall. correlate clinically. __________   2D echo 01/2015: - Left ventricle: The cavity size was mildly dilated. Systolic    function was severely reduced. The estimated ejection fraction    was in the range of 25% to 30%. Diffuse hypokinesis. Regional    wall motion abnormalities cannot be excluded. Doppler parameters    are consistent with abnormal left ventricular relaxation (grade 1    diastolic dysfunction).  - Left atrium: The atrium was normal in size.  - Right ventricle: Systolic function was normal.  - Pulmonary arteries: Systolic pressure  could not be estimated. __________   2D echo 08/2013: - Impressions: Limited study No doppler or M mode    Moderate LVE. No discrete RWMA;s    Diffuse hypokinesis worse in septum and inferior walls    EF 45-50%   Impressions:   - Limited study No doppler or M mode    Moderate LVE. No discrete RWMA;s    Diffuse hypokinesis worse in septum and inferior walls    EF 45-50%  __________   2d echo 08/2013: - Left ventricle: DIfficult apical windows. LVEF not well    visualized in apical windows. LV systolic function appears to be    moderately depressed.l Would recomm repeat evaluation with    contrast to further define wall motion. The cavity size was    mildly dilated. Wall thickness was normal.  __________   2D echo 04/2011: - Left ventricle: The cavity size was normal. There was mild    focal basal andmild concentric hypertrophy. Systolic    function was normal. The estimated ejection fraction was    in the range of 50% to 55%. Basal inferior, posterior and    septal wall hypokinesis. Most of the myocardium has good    wall motion.  - Mitral valve: Anterior leaflet redundancy. Trivial    regurgitation. Mild prolapse of anterior leaflet.  - Right ventricle: Systolic function was normal.  - Pulmonary arteries: Systolic pressure was within the    normal range.   EKG:  EKG is ordered today.  The EKG ordered today demonstrates NSR, 87 bpm, left axis deviation, LVH, inferior  T wave inversion which was noted on a previous tracing  Recent Labs: 11/20/2019: B Natriuretic Peptide 365.0 05/27/2020: TSH 0.392 05/28/2020: Magnesium 1.5 06/02/2020: Pro B Natriuretic peptide (BNP) 61.0 06/06/2020: ALT 40 09/27/2020: BUN 29; Creatinine, Ser 1.56; Hemoglobin 11.6; Platelets 242; Potassium 4.5; Sodium 138  Recent Lipid Panel    Component Value Date/Time   CHOL 150 11/09/2019 0911   CHOL 138 06/18/2017 1425   TRIG 77 11/09/2019 0911   HDL 47 11/09/2019 0911   HDL 67 06/18/2017 1425   CHOLHDL  3.2 11/09/2019 0911   VLDL 15 11/09/2019 0911   LDLCALC 88 11/09/2019 0911   LDLCALC 51 06/18/2017 1425   LDLDIRECT 69.0 12/30/2018 1232    PHYSICAL EXAM:    VS:  BP 128/80 (BP Location: Left Arm, Patient Position: Sitting, Cuff Size: Normal)   Pulse 87   Ht '6\' 4"'$  (1.93 m)   Wt 189 lb (85.7 kg)   SpO2 94%   BMI 23.01 kg/m   BMI: Body mass index is 23.01 kg/m.  Physical Exam Vitals reviewed.  Constitutional:      Appearance: He is well-developed.  HENT:     Head: Normocephalic and atraumatic.  Eyes:     General:        Right eye: No discharge.        Left eye: No discharge.  Neck:     Vascular: No JVD.  Cardiovascular:     Rate and Rhythm: Normal rate and regular rhythm.     Pulses:          Posterior tibial pulses are 2+ on the right side and 2+ on the left side.     Heart sounds: Normal heart sounds, S1 normal and S2 normal. Heart sounds not distant. No midsystolic click and no opening snap. No murmur heard.   No friction rub.  Pulmonary:     Effort: Pulmonary effort is normal. No respiratory distress.     Breath sounds: Normal breath sounds. No decreased breath sounds, wheezing or rales.  Chest:     Chest wall: No tenderness.  Abdominal:     General: There is no distension.     Palpations: Abdomen is soft.     Tenderness: There is no abdominal tenderness.  Musculoskeletal:     Cervical back: Normal range of motion.  Skin:    General: Skin is warm and dry.     Nails: There is no clubbing.  Neurological:     Mental Status: He is alert and oriented to person, place, and time.  Psychiatric:        Speech: Speech normal.        Behavior: Behavior normal.        Thought Content: Thought content normal.        Judgment: Judgment normal.    Wt Readings from Last 3 Encounters:  11/18/20 189 lb (85.7 kg)  10/21/20 191 lb (86.6 kg)  09/23/20 187 lb 6.4 oz (85 kg)     ASSESSMENT & PLAN:   HFrEF secondary to NICM/alcoholic cardiomyopathy: He appears euvolemic  and well compensated with NYHA class II symptoms.  Given frequent acute on chronic kidney injury, we will discontinue spironolactone.  We will place him on losartan 12.5 mg instead of Entresto secondary to significant dizziness associated with Entresto.  He will have labs drawn with nephrology next week.  He will continue carvedilol 6.25 mg twice daily.  He is not requiring a standing diuretic.  In follow-up, consider adding SGLT2i.  Continue to avoid spironolactone given tendency to develop volume depletion with multiple presentations with acute on CKD.  CHF education.  HTN: Blood pressure is well controlled in the office today.  Continue medical therapy as outlined above.  CKD stage III: Renal function improved and back to prior baseline.  He has been referred to nephrology to establish care, particularly in the setting of multiple presentations with acute on CKD which has limited escalation of GDMT.  HLD: Most recent lipid profile showed an LDL of 88.  He remains on atorvastatin 20 mg.  Chronic anemia with history of GI bleed: Recent Hgb stable.  No symptoms concerning for bleeding.  He remains on Protonix and ferrous sulfate.  Follow-up with hematology and GI as directed.  Substance use: He continues to abstain from alcohol use though does continue to smoke 1 pack/day.  He is not yet ready to quit smoking.  Complete cessation is recommended.  TIA: He remains on aspirin 162 mg daily along with atorvastatin.  Follow-up with PCP as directed.  Disposition: F/u with Dr. Fletcher Anon or an APP in 3 months.   Medication Adjustments/Labs and Tests Ordered: Current medicines are reviewed at length with the patient today.  Concerns regarding medicines are outlined above. Medication changes, Labs and Tests ordered today are summarized above and listed in the Patient Instructions accessible in Encounters.   Signed, Christell Faith, PA-C 11/18/2020 1:04 PM     [address] [place], [place] [address] [phone]

## 2020-11-17 ENCOUNTER — Other Ambulatory Visit: Payer: Self-pay | Admitting: Internal Medicine

## 2020-11-18 ENCOUNTER — Encounter: Payer: Self-pay | Admitting: Physician Assistant

## 2020-11-18 ENCOUNTER — Ambulatory Visit: Payer: Medicare HMO | Admitting: Physician Assistant

## 2020-11-18 ENCOUNTER — Other Ambulatory Visit: Payer: Self-pay

## 2020-11-18 VITALS — BP 128/80 | HR 87 | Ht 76.0 in | Wt 189.0 lb

## 2020-11-18 DIAGNOSIS — I10 Essential (primary) hypertension: Secondary | ICD-10-CM | POA: Diagnosis not present

## 2020-11-18 DIAGNOSIS — I428 Other cardiomyopathies: Secondary | ICD-10-CM | POA: Diagnosis not present

## 2020-11-18 DIAGNOSIS — D649 Anemia, unspecified: Secondary | ICD-10-CM | POA: Diagnosis not present

## 2020-11-18 DIAGNOSIS — Z8673 Personal history of transient ischemic attack (TIA), and cerebral infarction without residual deficits: Secondary | ICD-10-CM

## 2020-11-18 DIAGNOSIS — E785 Hyperlipidemia, unspecified: Secondary | ICD-10-CM

## 2020-11-18 DIAGNOSIS — I5022 Chronic systolic (congestive) heart failure: Secondary | ICD-10-CM | POA: Diagnosis not present

## 2020-11-18 DIAGNOSIS — I426 Alcoholic cardiomyopathy: Secondary | ICD-10-CM

## 2020-11-18 DIAGNOSIS — Z720 Tobacco use: Secondary | ICD-10-CM | POA: Diagnosis not present

## 2020-11-18 DIAGNOSIS — N183 Chronic kidney disease, stage 3 unspecified: Secondary | ICD-10-CM | POA: Diagnosis not present

## 2020-11-18 DIAGNOSIS — Z8719 Personal history of other diseases of the digestive system: Secondary | ICD-10-CM

## 2020-11-18 MED ORDER — LOSARTAN POTASSIUM 25 MG PO TABS: 12.5000 mg | ORAL_TABLET | Freq: Every day | ORAL | 5 refills | Status: DC

## 2020-11-18 NOTE — Patient Instructions (Signed)
Medication Instructions:  Your physician has recommended you make the following change in your medication:   1) STOP Spironolactone  2) START Losartan 12.5 mg (1/2 tablet) daily.   *If you need a refill on your cardiac medications before your next appointment, please call your pharmacy*   Lab Work: None ordered  If you have labs (blood work) drawn today and your tests are completely normal, you will receive your results only by: [place] (if you have MyChart) OR A paper copy in the mail If you have any lab test that is abnormal or we need to change your treatment, we will call you to review the results.   Testing/Procedures: None ordered   Follow-Up: At [facility], you and your health needs are our priority.  As part of our continuing mission to provide you with exceptional heart care, we have created designated Provider Care Teams.  These Care Teams include your primary Cardiologist (physician) and Advanced Practice Providers (APPs -  Physician Assistants and Nurse Practitioners) who all work together to provide you with the care you need, when you need it.  We recommend signing up for the patient portal called "MyChart".  Sign up information is provided on this After Visit Summary.  MyChart is used to connect with patients for Virtual Visits (Telemedicine).  Patients are able to view lab/test results, encounter notes, upcoming appointments, etc.  Non-urgent messages can be sent to your provider as well.   To learn more about what you can do with MyChart, go to [email or web address].    Your next appointment:   3 month(s)  The format for your next appointment:   In Person  Provider:   You may see Kathlyn Sacramento, MD or one of the following Advanced Practice Providers on your designated Care Team:   Murray Hodgkins, NP Christell Faith, PA-C Marrianne Mood, PA-C Cadence Kathlen Mody, [place]   Other Instructions N/A

## 2020-11-21 ENCOUNTER — Telehealth: Payer: Medicare HMO | Admitting: Adult Health

## 2020-11-23 NOTE — Telephone Encounter (Signed)
Refill request fenofibrate Last office visit 06/02/20 Last refill 05/13/20 #90 Upcoming appointment Travis [name] FNP10/22/22

## 2020-11-24 ENCOUNTER — Other Ambulatory Visit: Payer: Self-pay | Admitting: Nephrology

## 2020-11-24 DIAGNOSIS — R7309 Other abnormal glucose: Secondary | ICD-10-CM | POA: Diagnosis not present

## 2020-11-24 DIAGNOSIS — I509 Heart failure, unspecified: Secondary | ICD-10-CM | POA: Diagnosis not present

## 2020-11-24 DIAGNOSIS — N1831 Chronic kidney disease, stage 3a: Secondary | ICD-10-CM

## 2020-11-24 DIAGNOSIS — Z720 Tobacco use: Secondary | ICD-10-CM | POA: Diagnosis not present

## 2020-11-24 DIAGNOSIS — D631 Anemia in chronic kidney disease: Secondary | ICD-10-CM | POA: Diagnosis not present

## 2020-11-24 DIAGNOSIS — I10 Essential (primary) hypertension: Secondary | ICD-10-CM | POA: Diagnosis not present

## 2020-11-24 DIAGNOSIS — J449 Chronic obstructive pulmonary disease, unspecified: Secondary | ICD-10-CM | POA: Diagnosis not present

## 2020-11-24 DIAGNOSIS — R809 Proteinuria, unspecified: Secondary | ICD-10-CM | POA: Diagnosis not present

## 2020-11-24 DIAGNOSIS — R609 Edema, unspecified: Secondary | ICD-10-CM | POA: Diagnosis not present

## 2020-11-24 DIAGNOSIS — M1000 Idiopathic gout, unspecified site: Secondary | ICD-10-CM | POA: Diagnosis not present

## 2020-11-24 DIAGNOSIS — E785 Hyperlipidemia, unspecified: Secondary | ICD-10-CM | POA: Diagnosis not present

## 2020-11-29 ENCOUNTER — Ambulatory Visit (INDEPENDENT_AMBULATORY_CARE_PROVIDER_SITE_OTHER): Payer: Medicare HMO

## 2020-11-29 DIAGNOSIS — N183 Chronic kidney disease, stage 3 unspecified: Secondary | ICD-10-CM

## 2020-11-29 DIAGNOSIS — D509 Iron deficiency anemia, unspecified: Secondary | ICD-10-CM

## 2020-11-29 DIAGNOSIS — I5022 Chronic systolic (congestive) heart failure: Secondary | ICD-10-CM

## 2020-11-29 NOTE — Chronic Care Management (AMB) (Signed)
Chronic Care Management   CCM RN Visit Note  11/29/2020 Name: Travis [name] MRN: [id number] DOB: 01-15-1956  Subjective: Travis [name] is a 65 y.o. year old male who is a primary care patient of [name], NP. The care management team was consulted for assistance with disease management and care coordination needs.    Engaged with patient by telephone for follow up visit in response to provider referral for case management and/or care coordination services.   Consent to Services:  The patient was given information about Chronic Care Management services, agreed to services, and gave verbal consent prior to initiation of services.  Please see initial visit note for detailed documentation.   Patient agreed to services and verbal consent obtained.   Assessment: Review of patient past medical history, allergies, medications, health status, including review of consultants reports, laboratory and other test data, was performed as part of comprehensive evaluation and provision of chronic care management services.   SDOH (Social Determinants of Health) assessments and interventions performed:    CCM Care Plan  No Known Allergies  Outpatient Encounter Medications as of 11/29/2020  Medication Sig   albuterol (VENTOLIN HFA) 108 (90 Base) MCG/ACT inhaler Inhale 2 puffs into the lungs every 6 (six) hours as needed for wheezing or shortness of breath.   allopurinol (ZYLOPRIM) 100 MG tablet Take 2 tablets (200 mg total) by mouth daily. (Patient taking differently: Take 100 mg by mouth 2 (two) times daily.)   aspirin 325 MG tablet Take 162.5 mg by mouth in the morning.   atorvastatin (LIPITOR) 20 MG tablet Take 20 mg by mouth daily.   carvedilol (COREG) 6.25 MG tablet Take 1 tablet (6.25 mg total) by mouth 2 (two) times daily.   cetirizine (ZYRTEC) 10 MG tablet Take 1 tablet (10 mg total) by mouth daily.   fenofibrate (TRICOR) 145 MG tablet TAKE 1 TABLET BY MOUTH ONCE A DAY   ferrous sulfate 325  (65 FE) MG tablet Take 1 tablet (325 mg total) by mouth daily with breakfast.   fluticasone (FLONASE) 50 MCG/ACT nasal spray Place 1-2 sprays into both nostrils daily as needed for allergies or rhinitis.   hydroxypropyl methylcellulose / hypromellose (ISOPTO TEARS / GONIOVISC) 2.5 % ophthalmic solution Place 1 drop into both eyes 3 (three) times daily as needed for dry eyes.   losartan (COZAAR) 25 MG tablet Take 0.5 tablets (12.5 mg total) by mouth daily.   magnesium oxide (MAG-OX) 400 MG tablet Take 1 tablet (400 mg total) by mouth daily.   Methyl Salicylate-Lido-Menthol 4-4-5 % PTCH Apply 1 patch topically in the morning and at bedtime.   multivitamin (ONE-A-DAY MEN'S) TABS tablet Take 1 tablet by mouth daily with breakfast.   Omega-3 Fatty Acids (FISH OIL PO) Take 1 capsule by mouth daily.   oxymetazoline (AFRIN) 0.05 % nasal spray Place 1 spray into both nostrils 2 (two) times daily as needed for congestion (IF 3 DAYS "ON," 3 DAYS "OFF").   pantoprazole (PROTONIX) 40 MG tablet Take 1 tablet (40 mg total) by mouth daily.   triamcinolone cream (KENALOG) 0.1 % APPLY TO AFFECTED AREAS TWICE A DAY AS NEEDED. AVOID FACE, GROIN, OR UNDERARMS.   No facility-administered encounter medications on file as of 11/29/2020.    Patient Active Problem List   Diagnosis Date Noted   Protein-calorie malnutrition, severe 05/27/2020   Hypotension 05/26/2020   AKI (acute kidney injury) ([place]) 05/26/2020   Alcohol use 11/20/2019   CKD (chronic kidney disease), stage III ([place])  11/20/2019   TIA (transient ischemic attack) 11/08/2019   Osteoarthritis 05/09/2016   COPD (chronic obstructive pulmonary disease) ([place]) 05/09/2016   Essential hypertension 04/18/2015   Literacy level of illiterate 06/04/2014   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   Cardiomyopathy, nonischemic ([place]) [id number]   Chronic systolic heart failure ([place]) 01/05/2011   Tobacco use 01/05/2011   Diabetes ([place]) 11/30/2008   HLD  (hyperlipidemia) 11/30/2008   Gout 11/30/2008    Conditions to be addressed/monitored:CHF and CKD Stage III, Anemia  Care Plan : Heart Failure ( Adult)  Updates made by Dannielle Karvonen, RN since 11/29/2020 12:00 AM     Problem: Symptom Exacerbation (Heart Failure)   Priority: Medium     Long-Range Goal: Patient will minimize heart failure exacerbation and maintain kidney function   Start Date: 04/27/2020  Expected End Date: 02/22/2021  This Visit's Progress: On track  Recent Progress: On track  Priority: Medium  Note:   Current Barriers: Patient denies any symptoms of shortness of breath, swelling or weight gain.   Patient reports being in the [place] zone of his heart failure action plan.   Patient states he is scheduled to see his  primary care provider on 01/12/2021.   Patient reports having initial visit with nephrologist, Dr. Jerilynn Mages. Lanora Manis.  Patient reports he is not taking enestro or spironolactone any longer. He states he has been changed to losartan and furosemide. Patient states he was not aware of salt/ fluid restriction recommendations by Dr. Lanora Manis.  RN case manager reviewed recommendation with patient from Dr. Lillia Dallas 11/24/2020 visit note.  RN case manager also advised patient to re-discuss salt/ fluid restriction recommendations at next nephrologist office visit. Patient states he was unsure when his next visit was scheduled. RNCM contacted nephrologist office, spoke with Glenard Haring and confirmed patient's next follow up visit with nephrologist is 12/29/2020 at 2:00pm.  RNCM contacted patient and notified him of nephrology visit date/ time.  Knowledge deficit related to long term care plan for self management of heart failure and CKDII   Literacy Barriers: patient reports his niece assists him with reading / understanding medical documentation.  Case Manager Clinical Goal(s):  Patient will verbalize understanding of Heart Failure Action Plan and when to call doctor Patient will weigh  daily and record (notifying MD of 3 lb weight gain over night or 5 lb in a week) Interventions:  Collaboration with [name], NP regarding development and update of comprehensive plan of care as evidenced by provider attestation and co-signature Inter-disciplinary care team collaboration (see longitudinal plan of care) Basic overview and discussion of pathophysiology of Heart Failure reviewed  Provided verbal education on low sodium diet Reviewed Heart Failure Action Plan in depth. Reviewed Heart failure action plan with patient.  Discussed importance of daily weight and advised patient to weigh and record daily medication-adherence assessment completed:  Patient states he has all of his medications and continues to take as prescribed.    self-awareness of signs/symptoms of worsening disease encouraged:   Reviewed provider appointments with patient. Next follow up with cardiologist 02/23/2021 Patient advised to scheduled COVID 19 booster:   Smoking cessation discussed:  Patient reports he continues to smoke approximately 1 pack per day.  Patient states he is not ready for assistance with smoking cessation at this time. RNCM continues provide ongoing encouragement and support.   Patient Goals/Self-Care Activities -  continue to take your medications as prescribed. Refill your prescriptions timely. - Continue to weigh daily and record (notifying MD  of 3 lb weight gain over night or 5 lb in a week) - Continue to follow a low salt diet ( re-discuss this with Dr. Lanora Manis at your next office visit for clarification)  - Continue to eat more whole grains, fruits and vegetables, lean meats and healthy fats - follow rescue plan for symptoms flare-up - Continue to keep your follow up appointments with your doctors.  - Continue to watch for swelling in feet, ankles and legs every day. (Report these symptoms to your doctor) - Remember to schedule appointment for COVID 19 booster.  - Continue to work  on decreasing the number of cigarettes per day. Your RN case manager will continue to support you regarding smoking cessation. Contact your RN case manager if you needs assistance.   Follow Up Plan: The patient has been provided with contact information for the care management team and has been advised to call with any health related questions or concerns.  The care management team will reach out to the patient again over the next 45 days.      Care Plan : Anemia  Updates made by Dannielle Karvonen, RN since 11/29/2020 12:00 AM     Problem: Knowledge deficit related to long term health management of anemia   Priority: Medium     Long-Range Goal: Anemia Complications Prevented or Managed   Start Date: 06/06/2020  Expected End Date: 02/22/2021  This Visit's Progress: On track  Recent Progress: On track  Priority: Medium  Note:   Current Barriers: Patient states he is doing pretty good. Fatigue symptoms have improved some.    Patient states he continues to take his medications as prescribed and continues to maintain a good appetite and supplements his diet with ensure or boost. Patient states he saw the nephrologist as recommended by his oncology office on 11/24/2020.  Knowledge deficits related to self health management of anemia or bleeding:   Literacy barriers:  Patient reports his niece assists him with reading/ understanding medical documentation. Nurse Case Manager Clinical Goal(s):  patient will take all medications  as prescribed and will call provider for medication related questions patient will verbalize basic understanding of anemia disease process and self health management plan. Interventions:  Collaboration with [name], NP regarding development and update of comprehensive plan of care as evidenced by provider attestation and co-signature Inter-disciplinary care team collaboration (see longitudinal plan of care):  Patient scheduled with new primary care provider, Laverna Peace on 11/21/2020.  Informed patient he will transition the new RNCM covering the practice once he is established with new provider.  Basic overview and discussion of anemia/bleeding disorder or acute disease state Medications reviewed  Encouraged to maintain sobriety:   encouraged optimal oral intake to support fluid balance and nutrition:   encouraged dietary changes to increase dietary intake of iron, Vitamin 123456 and folic acid as advised/prescribed Increase your activity as tolerated and as advised by your doctor  Advised to keep provider appointments as advised:  Confirmed for patient next follow up visit with nephrology office is 12/29/2020 at 2:00 pm.  Patient notified of this.  Next cardiology follow up 02/23/2021 and follow up with new primary care provider is 01/12/2021.  - Smoking cessation discussed:  Patient reports he continues to smoke approximately 1 pack per day.  Patient states he is not ready for assistance with smoking cessation at this time. RNCM continues provide ongoing encouragement and support.   Patient Goals/Self-Care Activities: - Continue to eat iron rich foods  such as: lean meats and fish; Haedyn Breau leafy vegetables, such as kale and spinach; Alisa Stjames beans, eggs; dried fruits, such as dates and figs; broccoli. Increase your fruits, vegetables, and water to help with constipation.  Inquire with your doctor or pharmacist about over the counter stool softener or mild laxative.   - Continue to take your medications as prescribed. Refill your medications timely.  - Contact your doctor for new or concerning symptoms.  - Continue to Avoid alcohol -  Continue to monitor for bleeding from nose, gums, in urine or stool to provider and report these symptoms to your doctor as soon as possible.   - Notify your RN case manager if she can assist you with smoking cessation.   Follow Up Plan: The patient has been provided with contact information for the care management team and has been  advised to call with any health related questions or concerns.  The care management team will reach out to the patient again over the next 45 days.              Plan:The patient has been provided with contact information for the care management team and has been advised to call with any health related questions or concerns.  and The care management team will reach out to the patient again over the next 45 days. [name] RN,BSN,CCM RN Case Manager [place]  [phone]

## 2020-11-29 NOTE — Patient Instructions (Signed)
Visit Information:  Thank your for taking the time to speak with me today.   PATIENT GOALS:  Goals Addressed             This Visit's Progress    Manage anemia   On track    Timeframe:  Long-Range Goal Priority:  Medium Start Date:  06/06/2020                           Expected End Date:   02/22/2021  Follow up: 01/13/2021  Patient goals: - Continue to eat iron rich foods such as: lean meats and fish; Fadil Macmaster leafy vegetables, such as kale and spinach; Zenda Herskowitz beans, eggs; dried fruits, such as dates and figs; broccoli. Increase your fruits, vegetables, and water to help with constipation.  Inquire with your doctor or pharmacist about over the counter stool softener or mild laxative.   - Continue to take your medications as prescribed. Refill your medications timely.  - Contact your doctor for new or concerning symptoms.  - Continue to Avoid alcohol -  Continue to monitor for bleeding from nose, gums, in urine or stool to provider and report these symptoms to your doctor as soon as possible.   - Notify your RN case manager if she can assist you with smoking cessation.      Track and Manage heart failure symptoms/  Chronic kidney disease   On track    Timeframe:  Long-Range Goal Priority:  Medium Start Date: 04/27/2020                         Expected End Date:  02/22/2021              Follow Up Date 01/13/2021   -  continue to take your medications as prescribed. Refill your prescriptions timely. - Continue to weigh daily and record (notifying MD of 3 lb weight gain over night or 5 lb in a week) - Continue to follow a low salt diet ( re-discuss this with Dr. Lanora Manis at your next office visit for clarification)  - Continue to eat more whole grains, fruits and vegetables, lean meats and healthy fats - follow rescue plan for symptoms flare-up - Continue to keep your follow up appointments with your doctors.  - Continue to watch for swelling in feet, ankles and legs every day. (Report  these symptoms to your doctor) - Remember to schedule appointment for COVID 19 booster.  - Continue to work on decreasing the number of cigarettes per day. Your RN case manager will continue to support you regarding smoking cessation. Contact your RN case manager if you needs assistance.   Why is this important?   You will be able to handle your symptoms better if you keep track of them.  Making some simple changes to your lifestyle will help.  Eating healthy is one thing you can do to take good care of yourself.               Patient verbalizes understanding of instructions provided today and agrees to view in [place].  The patient has been provided with contact information for the care management team and has been advised to call with any health related questions or concerns.  The care management team will reach out to the patient again over the next 45 days.   [name] RN,BSN,CCM RN Case Manager [place]  [phone]

## 2020-12-08 ENCOUNTER — Other Ambulatory Visit: Payer: Self-pay

## 2020-12-08 ENCOUNTER — Ambulatory Visit
Admission: RE | Admit: 2020-12-08 | Discharge: 2020-12-08 | Disposition: A | Discharge status: Home / Self Care | Payer: Medicare HMO | Source: Ambulatory Visit | Attending: Nephrology | Admitting: Nephrology

## 2020-12-08 DIAGNOSIS — R809 Proteinuria, unspecified: Secondary | ICD-10-CM | POA: Diagnosis not present

## 2020-12-08 DIAGNOSIS — N1831 Chronic kidney disease, stage 3a: Secondary | ICD-10-CM | POA: Insufficient documentation

## 2020-12-08 DIAGNOSIS — N189 Chronic kidney disease, unspecified: Secondary | ICD-10-CM | POA: Diagnosis not present

## 2020-12-14 ENCOUNTER — Other Ambulatory Visit: Payer: Self-pay | Admitting: Family

## 2020-12-16 ENCOUNTER — Other Ambulatory Visit: Payer: Self-pay | Admitting: Family

## 2020-12-19 ENCOUNTER — Encounter: Payer: Self-pay | Admitting: Nurse Practitioner

## 2020-12-19 ENCOUNTER — Other Ambulatory Visit: Payer: Self-pay

## 2020-12-19 ENCOUNTER — Telehealth (INDEPENDENT_AMBULATORY_CARE_PROVIDER_SITE_OTHER): Payer: Medicare HMO | Admitting: Nurse Practitioner

## 2020-12-19 DIAGNOSIS — Z720 Tobacco use: Secondary | ICD-10-CM

## 2020-12-19 DIAGNOSIS — E782 Mixed hyperlipidemia: Secondary | ICD-10-CM | POA: Diagnosis not present

## 2020-12-19 DIAGNOSIS — I10 Essential (primary) hypertension: Secondary | ICD-10-CM

## 2020-12-19 DIAGNOSIS — J439 Emphysema, unspecified: Secondary | ICD-10-CM | POA: Diagnosis not present

## 2020-12-19 DIAGNOSIS — E119 Type 2 diabetes mellitus without complications: Secondary | ICD-10-CM

## 2020-12-19 DIAGNOSIS — M1000 Idiopathic gout, unspecified site: Secondary | ICD-10-CM | POA: Diagnosis not present

## 2020-12-19 MED ORDER — LOSARTAN POTASSIUM 25 MG PO TABS: 12.5000 mg | ORAL_TABLET | Freq: Every day | ORAL | 0 refills | Status: DC

## 2020-12-19 MED ORDER — MAGNESIUM OXIDE 400 MG PO TABS: 400.0000 mg | ORAL_TABLET | Freq: Every day | ORAL | 0 refills | Status: DC

## 2020-12-19 MED ORDER — ALLOPURINOL 100 MG PO TABS
100.0000 mg | ORAL_TABLET | Freq: Two times a day (BID) | ORAL | 0 refills | Status: DC
Start: 2020-12-19 — End: 2021-03-03

## 2020-12-19 NOTE — Assessment & Plan Note (Signed)
Patient currently maintained on atorvastatin, fenofibrate, fish oil.  Taking medication as prescribed encouraged lifestyle modifications.  Patient no longer drinking alcohol.  Continue medications as listed above.

## 2020-12-19 NOTE — Assessment & Plan Note (Signed)
Currently maintained on diet.  Continue with the patient office rechecking monthly.

## 2020-12-19 NOTE — Assessment & Plan Note (Signed)
Currently maintained on allopurinol, continued.

## 2020-12-19 NOTE — Assessment & Plan Note (Signed)
Patient currently maintained on losartan and carvedilol.  Patient does not check his blood pressure at home.  Patient has been exercising just not as of late due to the extreme temperatures.  Continue taking losartan and carvedilol as prescribed.

## 2020-12-19 NOTE — Progress Notes (Signed)
Patient ID: Travis [name], male    DOB: June 26, 1955, 65 y.o.   MRN: [id number]  Virtual visit completed through cargility, a video enabled telemedicine application. Due to national recommendations of social distancing due to COVID-19, a virtual visit is felt to be most appropriate for this patient at this time. Reviewed limitations, risks, security and privacy concerns of performing a virtual visit and the availability of in person appointments. I also reviewed that there may be a patient responsible charge related to this service. The patient agreed to proceed.    After attempting to uses video enabled device, it was unsuccessful and we revert to telephone encounter.  Patient location: home Provider location: [place] [facility], office Persons participating in this virtual visit: patient, provider   If any vitals were documented, they were collected by patient at home unless specified below.    There were no vitals taken for this visit.   CC: Transfer of Care Subjective:   HPI: Travis [name] is a 65 y.o. male presenting on 12/19/2020 for Transfer of care (From [facility]) and Medication Refill  [id number] not check blood pressure at home  COPD: Use the inhaler. States that he has not used the ihaler in couple months  DM: currently maintained on diet alone.  HLD: Eating meats and greens. Walks up to a friends house that is 23m[place] there and back. Does it up to 3 times a week. Has stopped due to the heat. But wants to start back   Relevant past medical, surgical, family and social history reviewed and updated as indicated. Interim medical history since our last visit reviewed. Allergies and medications reviewed and updated. Outpatient Medications Prior to Visit  Medication Sig Dispense Refill   albuterol (VENTOLIN HFA) 108 (90 Base) MCG/ACT inhaler Inhale 2 puffs into the lungs every 6 (six) hours as needed for wheezing or shortness of breath.     aspirin 325 MG tablet Take  162.5 mg by mouth in the morning.     atorvastatin (LIPITOR) 20 MG tablet Take 20 mg by mouth daily.     carvedilol (COREG) 6.25 MG tablet Take 1 tablet (6.25 mg total) by mouth 2 (two) times daily. 180 tablet 3   cetirizine (ZYRTEC) 10 MG tablet Take 1 tablet (10 mg total) by mouth daily. 90 tablet 1   fenofibrate (TRICOR) 145 MG tablet TAKE 1 TABLET BY MOUTH ONCE A DAY 90 tablet 0   ferrous sulfate 325 (65 FE) MG tablet Take 1 tablet (325 mg total) by mouth daily with breakfast. 90 tablet 2   fluticasone (FLONASE) 50 MCG/ACT nasal spray Place 1-2 sprays into both nostrils daily as needed for allergies or rhinitis.     hydroxypropyl methylcellulose / hypromellose (ISOPTO TEARS / GONIOVISC) 2.5 % ophthalmic solution Place 1 drop into both eyes 3 (three) times daily as needed for dry eyes.     multivitamin (ONE-A-DAY MEN'S) TABS tablet Take 1 tablet by mouth daily with breakfast.     Omega-3 Fatty Acids (FISH OIL PO) Take 1 capsule by mouth daily.     oxymetazoline (AFRIN) 0.05 % nasal spray Place 1 spray into both nostrils 2 (two) times daily as needed for congestion (IF 3 DAYS "ON," 3 DAYS "OFF").     pantoprazole (PROTONIX) 40 MG tablet Take 1 tablet (40 mg total) by mouth daily. 90 tablet 2   triamcinolone cream (KENALOG) 0.1 % APPLY TO AFFECTED AREAS TWICE A DAY AS NEEDED. AVOID FACE, GROIN, OR UNDERARMS.  454 g 1   allopurinol (ZYLOPRIM) 100 MG tablet Take 2 tablets (200 mg total) by mouth daily. (Patient taking differently: Take 100 mg by mouth 2 (two) times daily.) 180 tablet 0   losartan (COZAAR) 25 MG tablet Take 0.5 tablets (12.5 mg total) by mouth daily. 15 tablet 5   magnesium oxide (MAG-OX) 400 MG tablet Take 1 tablet (400 mg total) by mouth daily. 30 tablet 1   furosemide (LASIX) 40 MG tablet Take 1 tablet by mouth daily.     ENTRESTO 24-26 MG Take 1 tablet by mouth 2 (two) times daily.     Methyl Salicylate-Lido-Menthol 4-4-5 % PTCH Apply 1 patch topically in the morning and at  bedtime. 30 patch 0   No facility-administered medications prior to visit.  Patient currently maintained on losartan  Per HPI unless specifically indicated in ROS section below Review of Systems  Constitutional:  Negative for chills and fever.  Respiratory:  Positive for cough (clear). Negative for shortness of breath.   Cardiovascular:  Negative for chest pain and leg swelling.  Gastrointestinal:  Negative for blood in stool, constipation, diarrhea, nausea and vomiting.  Genitourinary:  Negative for dysuria and hematuria.       Nocturia early mornings  Neurological:  Negative for dizziness, light-headedness and headaches.  Psychiatric/Behavioral:  Negative for hallucinations and suicidal ideas.   Objective:  There were no vitals taken for this visit.  Wt Readings from Last 3 Encounters:  11/18/20 189 lb (85.7 kg)  10/21/20 191 lb (86.6 kg)  09/23/20 187 lb 6.4 oz (85 kg)       Physical exam: Gen: alert Pulm: speaks in complete sentences without increased work of breathing Psych: normal mood, normal thought content      Results for orders placed or performed during the hospital encounter of [id number]  Basic metabolic panel  Result Value Ref Range   Sodium 138 135 - 145 mmol/L   Potassium 4.5 3.5 - 5.1 mmol/L   Chloride 105 98 - 111 mmol/L   CO2 25 22 - 32 mmol/L   Glucose, Bld 149 (H) 70 - 99 mg/dL   BUN 29 (H) 8 - 23 mg/dL   Creatinine, Ser 1.56 (H) 0.61 - 1.24 mg/dL   Calcium 10.2 8.9 - 10.3 mg/dL   GFR, Estimated 49 (L) >60 mL/min   Anion gap 8 5 - 15  Ferritin  Result Value Ref Range   Ferritin 494 (H) 24 - 336 ng/mL  Iron and TIBC  Result Value Ref Range   Iron 94 45 - 182 ug/dL   TIBC 375 250 - 450 ug/dL   Saturation Ratios 25 17.9 - 39.5 %   UIBC 281 ug/dL  CBC with Differential/Platelet  Result Value Ref Range   WBC 4.7 4.0 - 10.5 K/uL   RBC 3.54 (L) 4.22 - 5.81 MIL/uL   Hemoglobin 11.6 (L) 13.0 - 17.0 g/dL   HCT 34.4 (L) 39.0 - 52.0 %   MCV 97.2 80.0  - 100.0 fL   MCH 32.8 26.0 - 34.0 pg   MCHC 33.7 30.0 - 36.0 g/dL   RDW 15.5 11.5 - 15.5 %   Platelets 242 150 - 400 K/uL   nRBC 0.0 0.0 - 0.2 %   Neutrophils Relative % 51 %   Neutro Abs 2.4 1.7 - 7.7 K/uL   Lymphocytes Relative 27 %   Lymphs Abs 1.3 0.7 - 4.0 K/uL   Monocytes Relative 9 %   Monocytes Absolute 0.4 0.1 - 1.0  K/uL   Eosinophils Relative 13 %   Eosinophils Absolute 0.6 (H) 0.0 - 0.5 K/uL   Basophils Relative 0 %   Basophils Absolute 0.0 0.0 - 0.1 K/uL   Immature Granulocytes 0 %   Abs Immature Granulocytes 0.01 0.00 - 0.07 K/uL   Assessment & Plan:   Problem List Items Addressed This Visit       Cardiovascular and Mediastinum   Essential hypertension - Primary    Patient currently maintained on losartan and carvedilol.  Patient does not check his blood pressure at home.  Patient has been exercising just not as of late due to the extreme temperatures.  Continue taking losartan and carvedilol as prescribed.      Relevant Medications   furosemide (LASIX) 40 MG tablet   losartan (COZAAR) 25 MG tablet     Respiratory   COPD (chronic obstructive pulmonary disease) (HCC)    Currently maintained on albuterol as needed.  Patient states he does not even use it once a month.  He has continued smoking encouraged quitting.  Continue albuterol as needed use        Endocrine   Diabetes ([place])    Currently maintained on diet.  Continue with the patient office rechecking monthly.      Relevant Medications   losartan (COZAAR) 25 MG tablet     Other   HLD (hyperlipidemia)    Patient currently maintained on atorvastatin, fenofibrate, fish oil.  Taking medication as prescribed encouraged lifestyle modifications.  Patient no longer drinking alcohol.  Continue medications as listed above.      Relevant Medications   furosemide (LASIX) 40 MG tablet   losartan (COZAAR) 25 MG tablet   Gout    Currently maintained on allopurinol, continued.      Tobacco use    Patient  current everyday smoker.        No orders of the defined types were placed in this encounter.  No orders of the defined types were placed in this encounter.  Phone call lasted 12 minutes and 50 seconds  I discussed the assessment and treatment plan with the patient. The patient was provided an opportunity to ask questions and all were answered. The patient agreed with the plan and demonstrated an understanding of the instructions. The patient was advised to call back or seek an in-person evaluation if the symptoms worsen or if the condition fails to improve as anticipated.  Follow up plan: No follow-ups on file.  Romilda Garret, NP

## 2020-12-19 NOTE — Assessment & Plan Note (Signed)
Currently maintained on albuterol as needed.  Patient states he does not even use it once a month.  He has continued smoking encouraged quitting.  Continue albuterol as needed use

## 2020-12-19 NOTE — Assessment & Plan Note (Signed)
Patient current everyday smoker.

## 2020-12-23 ENCOUNTER — Ambulatory Visit: Payer: Medicare HMO

## 2020-12-23 DIAGNOSIS — D509 Iron deficiency anemia, unspecified: Secondary | ICD-10-CM | POA: Diagnosis not present

## 2020-12-23 DIAGNOSIS — I5022 Chronic systolic (congestive) heart failure: Secondary | ICD-10-CM

## 2020-12-23 DIAGNOSIS — N183 Chronic kidney disease, stage 3 unspecified: Secondary | ICD-10-CM | POA: Diagnosis not present

## 2020-12-30 ENCOUNTER — Ambulatory Visit (INDEPENDENT_AMBULATORY_CARE_PROVIDER_SITE_OTHER): Payer: Medicare HMO

## 2020-12-30 ENCOUNTER — Other Ambulatory Visit: Payer: Self-pay

## 2020-12-30 DIAGNOSIS — Z23 Encounter for immunization: Secondary | ICD-10-CM

## 2021-01-01 ENCOUNTER — Encounter (HOSPITAL_COMMUNITY)
Admission: EM | Disposition: A | Discharge status: Skilled Nursing Facility | Payer: Self-pay | Source: Home / Self Care | Attending: Internal Medicine

## 2021-01-01 ENCOUNTER — Emergency Department (HOSPITAL_COMMUNITY): Payer: Medicare HMO

## 2021-01-01 ENCOUNTER — Inpatient Hospital Stay (HOSPITAL_COMMUNITY): Payer: Medicare HMO | Admitting: Anesthesiology

## 2021-01-01 ENCOUNTER — Encounter (HOSPITAL_COMMUNITY): Payer: Self-pay | Admitting: Emergency Medicine

## 2021-01-01 ENCOUNTER — Inpatient Hospital Stay (HOSPITAL_COMMUNITY)
Admission: EM | Admit: 2021-01-01 | Discharge: 2021-02-09 | DRG: 853 | Disposition: A | Discharge status: Skilled Nursing Facility | Payer: Medicare HMO | Attending: Internal Medicine | Admitting: Internal Medicine

## 2021-01-01 ENCOUNTER — Other Ambulatory Visit: Payer: Self-pay

## 2021-01-01 ENCOUNTER — Inpatient Hospital Stay (HOSPITAL_COMMUNITY): Payer: Medicare HMO

## 2021-01-01 DIAGNOSIS — R34 Anuria and oliguria: Secondary | ICD-10-CM | POA: Diagnosis not present

## 2021-01-01 DIAGNOSIS — R0902 Hypoxemia: Secondary | ICD-10-CM | POA: Diagnosis not present

## 2021-01-01 DIAGNOSIS — N179 Acute kidney failure, unspecified: Secondary | ICD-10-CM

## 2021-01-01 DIAGNOSIS — D689 Coagulation defect, unspecified: Secondary | ICD-10-CM | POA: Diagnosis present

## 2021-01-01 DIAGNOSIS — M47816 Spondylosis without myelopathy or radiculopathy, lumbar region: Secondary | ICD-10-CM | POA: Diagnosis not present

## 2021-01-01 DIAGNOSIS — Z978 Presence of other specified devices: Secondary | ICD-10-CM

## 2021-01-01 DIAGNOSIS — K3532 Acute appendicitis with perforation and localized peritonitis, without abscess: Secondary | ICD-10-CM | POA: Diagnosis not present

## 2021-01-01 DIAGNOSIS — E1122 Type 2 diabetes mellitus with diabetic chronic kidney disease: Secondary | ICD-10-CM | POA: Diagnosis present

## 2021-01-01 DIAGNOSIS — R109 Unspecified abdominal pain: Secondary | ICD-10-CM

## 2021-01-01 DIAGNOSIS — F1290 Cannabis use, unspecified, uncomplicated: Secondary | ICD-10-CM | POA: Diagnosis present

## 2021-01-01 DIAGNOSIS — N170 Acute kidney failure with tubular necrosis: Secondary | ICD-10-CM | POA: Diagnosis not present

## 2021-01-01 DIAGNOSIS — D735 Infarction of spleen: Secondary | ICD-10-CM | POA: Diagnosis not present

## 2021-01-01 DIAGNOSIS — R739 Hyperglycemia, unspecified: Secondary | ICD-10-CM | POA: Diagnosis not present

## 2021-01-01 DIAGNOSIS — D62 Acute posthemorrhagic anemia: Secondary | ICD-10-CM | POA: Diagnosis not present

## 2021-01-01 DIAGNOSIS — R0602 Shortness of breath: Secondary | ICD-10-CM

## 2021-01-01 DIAGNOSIS — R578 Other shock: Secondary | ICD-10-CM | POA: Diagnosis not present

## 2021-01-01 DIAGNOSIS — I5022 Chronic systolic (congestive) heart failure: Secondary | ICD-10-CM | POA: Diagnosis not present

## 2021-01-01 DIAGNOSIS — I2510 Atherosclerotic heart disease of native coronary artery without angina pectoris: Secondary | ICD-10-CM | POA: Diagnosis not present

## 2021-01-01 DIAGNOSIS — Z96611 Presence of right artificial shoulder joint: Secondary | ICD-10-CM | POA: Diagnosis present

## 2021-01-01 DIAGNOSIS — Y838 Other surgical procedures as the cause of abnormal reaction of the patient, or of later complication, without mention of misadventure at the time of the procedure: Secondary | ICD-10-CM | POA: Diagnosis not present

## 2021-01-01 DIAGNOSIS — B958 Unspecified staphylococcus as the cause of diseases classified elsewhere: Secondary | ICD-10-CM | POA: Diagnosis not present

## 2021-01-01 DIAGNOSIS — I444 Left anterior fascicular block: Secondary | ICD-10-CM | POA: Diagnosis present

## 2021-01-01 DIAGNOSIS — J9690 Respiratory failure, unspecified, unspecified whether with hypoxia or hypercapnia: Secondary | ICD-10-CM | POA: Diagnosis not present

## 2021-01-01 DIAGNOSIS — E782 Mixed hyperlipidemia: Secondary | ICD-10-CM

## 2021-01-01 DIAGNOSIS — R6521 Severe sepsis with septic shock: Secondary | ICD-10-CM | POA: Diagnosis not present

## 2021-01-01 DIAGNOSIS — H3410 Central retinal artery occlusion, unspecified eye: Secondary | ICD-10-CM | POA: Diagnosis present

## 2021-01-01 DIAGNOSIS — Z96641 Presence of right artificial hip joint: Secondary | ICD-10-CM | POA: Diagnosis present

## 2021-01-01 DIAGNOSIS — R509 Fever, unspecified: Secondary | ICD-10-CM | POA: Diagnosis not present

## 2021-01-01 DIAGNOSIS — Z515 Encounter for palliative care: Secondary | ICD-10-CM | POA: Diagnosis not present

## 2021-01-01 DIAGNOSIS — Z743 Need for continuous supervision: Secondary | ICD-10-CM | POA: Diagnosis not present

## 2021-01-01 DIAGNOSIS — K3533 Acute appendicitis with perforation and localized peritonitis, with abscess: Secondary | ICD-10-CM | POA: Diagnosis present

## 2021-01-01 DIAGNOSIS — S36039A Unspecified laceration of spleen, initial encounter: Secondary | ICD-10-CM | POA: Diagnosis not present

## 2021-01-01 DIAGNOSIS — N1831 Chronic kidney disease, stage 3a: Secondary | ICD-10-CM | POA: Diagnosis not present

## 2021-01-01 DIAGNOSIS — E875 Hyperkalemia: Secondary | ICD-10-CM | POA: Diagnosis present

## 2021-01-01 DIAGNOSIS — Z4659 Encounter for fitting and adjustment of other gastrointestinal appliance and device: Secondary | ICD-10-CM

## 2021-01-01 DIAGNOSIS — K651 Peritoneal abscess: Secondary | ICD-10-CM

## 2021-01-01 DIAGNOSIS — Z833 Family history of diabetes mellitus: Secondary | ICD-10-CM

## 2021-01-01 DIAGNOSIS — E119 Type 2 diabetes mellitus without complications: Secondary | ICD-10-CM

## 2021-01-01 DIAGNOSIS — J9611 Chronic respiratory failure with hypoxia: Secondary | ICD-10-CM | POA: Diagnosis not present

## 2021-01-01 DIAGNOSIS — J9811 Atelectasis: Secondary | ICD-10-CM | POA: Diagnosis not present

## 2021-01-01 DIAGNOSIS — A4151 Sepsis due to Escherichia coli [E. coli]: Secondary | ICD-10-CM | POA: Diagnosis not present

## 2021-01-01 DIAGNOSIS — Z7189 Other specified counseling: Secondary | ICD-10-CM | POA: Diagnosis not present

## 2021-01-01 DIAGNOSIS — R571 Hypovolemic shock: Secondary | ICD-10-CM | POA: Diagnosis not present

## 2021-01-01 DIAGNOSIS — Z6823 Body mass index (BMI) 23.0-23.9, adult: Secondary | ICD-10-CM

## 2021-01-01 DIAGNOSIS — D7389 Other diseases of spleen: Secondary | ICD-10-CM | POA: Diagnosis not present

## 2021-01-01 DIAGNOSIS — I426 Alcoholic cardiomyopathy: Secondary | ICD-10-CM | POA: Diagnosis present

## 2021-01-01 DIAGNOSIS — K6389 Other specified diseases of intestine: Secondary | ICD-10-CM | POA: Diagnosis not present

## 2021-01-01 DIAGNOSIS — Z9081 Acquired absence of spleen: Secondary | ICD-10-CM

## 2021-01-01 DIAGNOSIS — Z66 Do not resuscitate: Secondary | ICD-10-CM | POA: Diagnosis not present

## 2021-01-01 DIAGNOSIS — I82622 Acute embolism and thrombosis of deep veins of left upper extremity: Secondary | ICD-10-CM | POA: Diagnosis not present

## 2021-01-01 DIAGNOSIS — M25562 Pain in left knee: Secondary | ICD-10-CM | POA: Diagnosis not present

## 2021-01-01 DIAGNOSIS — Z8673 Personal history of transient ischemic attack (TIA), and cerebral infarction without residual deficits: Secondary | ICD-10-CM

## 2021-01-01 DIAGNOSIS — Z8711 Personal history of peptic ulcer disease: Secondary | ICD-10-CM

## 2021-01-01 DIAGNOSIS — R279 Unspecified lack of coordination: Secondary | ICD-10-CM | POA: Diagnosis not present

## 2021-01-01 DIAGNOSIS — K6811 Postprocedural retroperitoneal abscess: Secondary | ICD-10-CM | POA: Diagnosis not present

## 2021-01-01 DIAGNOSIS — J811 Chronic pulmonary edema: Secondary | ICD-10-CM | POA: Diagnosis not present

## 2021-01-01 DIAGNOSIS — R188 Other ascites: Secondary | ICD-10-CM | POA: Diagnosis not present

## 2021-01-01 DIAGNOSIS — J948 Other specified pleural conditions: Secondary | ICD-10-CM | POA: Diagnosis not present

## 2021-01-01 DIAGNOSIS — G9341 Metabolic encephalopathy: Secondary | ICD-10-CM | POA: Diagnosis not present

## 2021-01-01 DIAGNOSIS — J9601 Acute respiratory failure with hypoxia: Secondary | ICD-10-CM | POA: Diagnosis not present

## 2021-01-01 DIAGNOSIS — I428 Other cardiomyopathies: Secondary | ICD-10-CM | POA: Diagnosis not present

## 2021-01-01 DIAGNOSIS — K9189 Other postprocedural complications and disorders of digestive system: Secondary | ICD-10-CM | POA: Diagnosis not present

## 2021-01-01 DIAGNOSIS — K560 Paralytic ileus: Secondary | ICD-10-CM | POA: Diagnosis not present

## 2021-01-01 DIAGNOSIS — Z4682 Encounter for fitting and adjustment of non-vascular catheter: Secondary | ICD-10-CM | POA: Diagnosis not present

## 2021-01-01 DIAGNOSIS — R532 Functional quadriplegia: Secondary | ICD-10-CM | POA: Diagnosis not present

## 2021-01-01 DIAGNOSIS — K567 Ileus, unspecified: Secondary | ICD-10-CM | POA: Diagnosis not present

## 2021-01-01 DIAGNOSIS — Z79899 Other long term (current) drug therapy: Secondary | ICD-10-CM

## 2021-01-01 DIAGNOSIS — F1011 Alcohol abuse, in remission: Secondary | ICD-10-CM | POA: Diagnosis present

## 2021-01-01 DIAGNOSIS — K8020 Calculus of gallbladder without cholecystitis without obstruction: Secondary | ICD-10-CM | POA: Diagnosis not present

## 2021-01-01 DIAGNOSIS — M1990 Unspecified osteoarthritis, unspecified site: Secondary | ICD-10-CM | POA: Diagnosis present

## 2021-01-01 DIAGNOSIS — E46 Unspecified protein-calorie malnutrition: Secondary | ICD-10-CM | POA: Diagnosis not present

## 2021-01-01 DIAGNOSIS — K3521 Acute appendicitis with generalized peritonitis, with abscess: Secondary | ICD-10-CM | POA: Diagnosis not present

## 2021-01-01 DIAGNOSIS — I130 Hypertensive heart and chronic kidney disease with heart failure and stage 1 through stage 4 chronic kidney disease, or unspecified chronic kidney disease: Secondary | ICD-10-CM | POA: Diagnosis present

## 2021-01-01 DIAGNOSIS — Z48813 Encounter for surgical aftercare following surgery on the respiratory system: Secondary | ICD-10-CM | POA: Diagnosis not present

## 2021-01-01 DIAGNOSIS — R0603 Acute respiratory distress: Secondary | ICD-10-CM

## 2021-01-01 DIAGNOSIS — R059 Cough, unspecified: Secondary | ICD-10-CM | POA: Diagnosis not present

## 2021-01-01 DIAGNOSIS — R143 Flatulence: Secondary | ICD-10-CM | POA: Diagnosis not present

## 2021-01-01 DIAGNOSIS — Z452 Encounter for adjustment and management of vascular access device: Secondary | ICD-10-CM | POA: Diagnosis not present

## 2021-01-01 DIAGNOSIS — K91870 Postprocedural hematoma of a digestive system organ or structure following a digestive system procedure: Secondary | ICD-10-CM | POA: Diagnosis not present

## 2021-01-01 DIAGNOSIS — K859 Acute pancreatitis without necrosis or infection, unspecified: Secondary | ICD-10-CM | POA: Diagnosis not present

## 2021-01-01 DIAGNOSIS — S301XXA Contusion of abdominal wall, initial encounter: Secondary | ICD-10-CM | POA: Diagnosis not present

## 2021-01-01 DIAGNOSIS — J45909 Unspecified asthma, uncomplicated: Secondary | ICD-10-CM | POA: Diagnosis not present

## 2021-01-01 DIAGNOSIS — H3412 Central retinal artery occlusion, left eye: Secondary | ICD-10-CM

## 2021-01-01 DIAGNOSIS — Z1639 Resistance to other specified antimicrobial drug: Secondary | ICD-10-CM | POA: Diagnosis present

## 2021-01-01 DIAGNOSIS — Z9889 Other specified postprocedural states: Secondary | ICD-10-CM

## 2021-01-01 DIAGNOSIS — E872 Acidosis, unspecified: Secondary | ICD-10-CM | POA: Diagnosis present

## 2021-01-01 DIAGNOSIS — J90 Pleural effusion, not elsewhere classified: Secondary | ICD-10-CM | POA: Diagnosis not present

## 2021-01-01 DIAGNOSIS — E1165 Type 2 diabetes mellitus with hyperglycemia: Secondary | ICD-10-CM | POA: Diagnosis present

## 2021-01-01 DIAGNOSIS — A419 Sepsis, unspecified organism: Secondary | ICD-10-CM

## 2021-01-01 DIAGNOSIS — J439 Emphysema, unspecified: Secondary | ICD-10-CM | POA: Diagnosis not present

## 2021-01-01 DIAGNOSIS — Z0189 Encounter for other specified special examinations: Secondary | ICD-10-CM

## 2021-01-01 DIAGNOSIS — K5700 Diverticulitis of small intestine with perforation and abscess without bleeding: Secondary | ICD-10-CM | POA: Diagnosis present

## 2021-01-01 DIAGNOSIS — M109 Gout, unspecified: Secondary | ICD-10-CM | POA: Diagnosis present

## 2021-01-01 DIAGNOSIS — N183 Chronic kidney disease, stage 3 unspecified: Secondary | ICD-10-CM | POA: Diagnosis not present

## 2021-01-01 DIAGNOSIS — E440 Moderate protein-calorie malnutrition: Secondary | ICD-10-CM | POA: Diagnosis not present

## 2021-01-01 DIAGNOSIS — M1000 Idiopathic gout, unspecified site: Secondary | ICD-10-CM

## 2021-01-01 DIAGNOSIS — R1084 Generalized abdominal pain: Secondary | ICD-10-CM | POA: Diagnosis not present

## 2021-01-01 DIAGNOSIS — I10 Essential (primary) hypertension: Secondary | ICD-10-CM | POA: Diagnosis not present

## 2021-01-01 DIAGNOSIS — Z20822 Contact with and (suspected) exposure to covid-19: Secondary | ICD-10-CM | POA: Diagnosis present

## 2021-01-01 DIAGNOSIS — R079 Chest pain, unspecified: Secondary | ICD-10-CM

## 2021-01-01 DIAGNOSIS — H547 Unspecified visual loss: Secondary | ICD-10-CM | POA: Diagnosis present

## 2021-01-01 DIAGNOSIS — I132 Hypertensive heart and chronic kidney disease with heart failure and with stage 5 chronic kidney disease, or end stage renal disease: Secondary | ICD-10-CM | POA: Diagnosis not present

## 2021-01-01 DIAGNOSIS — J939 Pneumothorax, unspecified: Secondary | ICD-10-CM | POA: Diagnosis not present

## 2021-01-01 DIAGNOSIS — E785 Hyperlipidemia, unspecified: Secondary | ICD-10-CM | POA: Diagnosis not present

## 2021-01-01 DIAGNOSIS — I472 Ventricular tachycardia, unspecified: Secondary | ICD-10-CM | POA: Diagnosis not present

## 2021-01-01 DIAGNOSIS — I700 Atherosclerosis of aorta: Secondary | ICD-10-CM | POA: Diagnosis not present

## 2021-01-01 DIAGNOSIS — Z431 Encounter for attention to gastrostomy: Secondary | ICD-10-CM | POA: Diagnosis not present

## 2021-01-01 DIAGNOSIS — M25561 Pain in right knee: Secondary | ICD-10-CM | POA: Diagnosis not present

## 2021-01-01 DIAGNOSIS — R58 Hemorrhage, not elsewhere classified: Secondary | ICD-10-CM | POA: Diagnosis not present

## 2021-01-01 DIAGNOSIS — T8143XA Infection following a procedure, organ and space surgical site, initial encounter: Secondary | ICD-10-CM | POA: Diagnosis not present

## 2021-01-01 DIAGNOSIS — Z8249 Family history of ischemic heart disease and other diseases of the circulatory system: Secondary | ICD-10-CM

## 2021-01-01 DIAGNOSIS — R52 Pain, unspecified: Secondary | ICD-10-CM

## 2021-01-01 DIAGNOSIS — K429 Umbilical hernia without obstruction or gangrene: Secondary | ICD-10-CM | POA: Diagnosis not present

## 2021-01-01 DIAGNOSIS — D649 Anemia, unspecified: Secondary | ICD-10-CM | POA: Diagnosis not present

## 2021-01-01 DIAGNOSIS — Z23 Encounter for immunization: Secondary | ICD-10-CM | POA: Diagnosis not present

## 2021-01-01 DIAGNOSIS — F17210 Nicotine dependence, cigarettes, uncomplicated: Secondary | ICD-10-CM | POA: Diagnosis present

## 2021-01-01 DIAGNOSIS — L02211 Cutaneous abscess of abdominal wall: Secondary | ICD-10-CM | POA: Diagnosis not present

## 2021-01-01 DIAGNOSIS — D631 Anemia in chronic kidney disease: Secondary | ICD-10-CM | POA: Diagnosis present

## 2021-01-01 DIAGNOSIS — Z7982 Long term (current) use of aspirin: Secondary | ICD-10-CM

## 2021-01-01 DIAGNOSIS — D6489 Other specified anemias: Secondary | ICD-10-CM | POA: Diagnosis present

## 2021-01-01 DIAGNOSIS — S36029A Unspecified contusion of spleen, initial encounter: Secondary | ICD-10-CM | POA: Diagnosis not present

## 2021-01-01 DIAGNOSIS — R627 Adult failure to thrive: Secondary | ICD-10-CM | POA: Diagnosis not present

## 2021-01-01 DIAGNOSIS — F4321 Adjustment disorder with depressed mood: Secondary | ICD-10-CM | POA: Diagnosis not present

## 2021-01-01 DIAGNOSIS — G8918 Other acute postprocedural pain: Secondary | ICD-10-CM | POA: Diagnosis not present

## 2021-01-01 DIAGNOSIS — R000 Tachycardia, unspecified: Secondary | ICD-10-CM | POA: Diagnosis not present

## 2021-01-01 DIAGNOSIS — J449 Chronic obstructive pulmonary disease, unspecified: Secondary | ICD-10-CM | POA: Diagnosis not present

## 2021-01-01 DIAGNOSIS — K529 Noninfective gastroenteritis and colitis, unspecified: Secondary | ICD-10-CM | POA: Diagnosis not present

## 2021-01-01 HISTORY — PX: LAPAROSCOPIC APPENDECTOMY: SHX408

## 2021-01-01 LAB — LACTIC ACID, PLASMA
Lactic Acid, Venous: 2.3 mmol/L (ref 0.5–1.9)
Lactic Acid, Venous: 3 mmol/L (ref 0.5–1.9)
Lactic Acid, Venous: 1.7 mmol/L (ref 0.5–1.9)
Lactic Acid, Venous: 1.6 mmol/L (ref 0.5–1.9)

## 2021-01-01 LAB — URINALYSIS, ROUTINE W REFLEX MICROSCOPIC
Bacteria, UA: NONE SEEN
Bilirubin Urine: NEGATIVE
Glucose, UA: 50 mg/dL — AB
Ketones, ur: NEGATIVE mg/dL
Leukocytes,Ua: NEGATIVE
Nitrite: NEGATIVE
Protein, ur: 30 mg/dL — AB
Specific Gravity, Urine: >1.046 — ABNORMAL HIGH (ref 1.005–1.030)
pH: 5 (ref 5.0–8.0)

## 2021-01-01 LAB — CBC WITH DIFFERENTIAL/PLATELET
Abs Immature Granulocytes: 0.02 10*3/uL (ref 0.00–0.07)
Basophils Absolute: 0 10*3/uL (ref 0.0–0.1)
Basophils Relative: 0 %
Eosinophils Absolute: 0.2 10*3/uL (ref 0.0–0.5)
Eosinophils Relative: 2 %
HCT: 52.8 % — ABNORMAL HIGH (ref 39.0–52.0)
Hemoglobin: 16.7 g/dL (ref 13.0–17.0)
Immature Granulocytes: 0 %
Lymphocytes Relative: 15 %
Lymphs Abs: 1.3 10*3/uL (ref 0.7–4.0)
MCH: 31.7 pg (ref 26.0–34.0)
MCHC: 31.6 g/dL (ref 30.0–36.0)
MCV: 100.4 fL — ABNORMAL HIGH (ref 80.0–100.0)
Monocytes Absolute: 0.3 10*3/uL (ref 0.1–1.0)
Monocytes Relative: 3 %
Neutro Abs: 7.2 10*3/uL (ref 1.7–7.7)
Neutrophils Relative %: 80 %
Platelets: 209 10*3/uL (ref 150–400)
RBC: 5.26 MIL/uL (ref 4.22–5.81)
RDW: 14.9 % (ref 11.5–15.5)
WBC: 9.1 10*3/uL (ref 4.0–10.5)
nRBC: 0 % (ref 0.0–0.2)

## 2021-01-01 LAB — CBG MONITORING, ED: Glucose-Capillary: 263 mg/dL — ABNORMAL HIGH (ref 70–99)

## 2021-01-01 LAB — COMPREHENSIVE METABOLIC PANEL
ALT: 19 U/L (ref 0–44)
AST: 24 U/L (ref 15–41)
Albumin: 4.5 g/dL (ref 3.5–5.0)
Alkaline Phosphatase: 103 U/L (ref 38–126)
Anion gap: 17 — ABNORMAL HIGH (ref 5–15)
BUN: 20 mg/dL (ref 8–23)
CO2: 19 mmol/L — ABNORMAL LOW (ref 22–32)
Calcium: 10.4 mg/dL — ABNORMAL HIGH (ref 8.9–10.3)
Chloride: 98 mmol/L (ref 98–111)
Creatinine, Ser: 1.51 mg/dL — ABNORMAL HIGH (ref 0.61–1.24)
GFR, Estimated: 51 mL/min — ABNORMAL LOW (ref >60)
Glucose, Bld: 279 mg/dL — ABNORMAL HIGH (ref 70–99)
Potassium: 5.2 mmol/L — ABNORMAL HIGH (ref 3.5–5.1)
Sodium: 134 mmol/L — ABNORMAL LOW (ref 135–145)
Total Bilirubin: 1.2 mg/dL (ref 0.3–1.2)
Total Protein: 8.5 g/dL — ABNORMAL HIGH (ref 6.5–8.1)

## 2021-01-01 LAB — PROTIME-INR
INR: 1.1 (ref 0.8–1.2)
Prothrombin Time: 14.4 seconds (ref 11.4–15.2)

## 2021-01-01 LAB — RESP PANEL BY RT-PCR (FLU A&B, COVID) ARPGX2
Influenza A by PCR: NEGATIVE
Influenza B by PCR: NEGATIVE
SARS Coronavirus 2 by RT PCR: NEGATIVE

## 2021-01-01 LAB — LIPASE, BLOOD: Lipase: 31 U/L (ref 11–51)

## 2021-01-01 LAB — GLUCOSE, CAPILLARY: Glucose-Capillary: 220 mg/dL — ABNORMAL HIGH (ref 70–99)

## 2021-01-01 SURGERY — APPENDECTOMY, LAPAROSCOPIC
Anesthesia: General

## 2021-01-01 MED ORDER — MIDAZOLAM HCL 5 MG/5ML IJ SOLN
INTRAMUSCULAR | Status: DC | PRN
  Administered 2021-01-01: 2 mg via INTRAVENOUS

## 2021-01-01 MED ORDER — FENTANYL CITRATE (PF) 250 MCG/5ML IJ SOLN
INTRAMUSCULAR | Status: AC
  Filled 2021-01-01: qty 5

## 2021-01-01 MED ORDER — PIPERACILLIN-TAZOBACTAM 3.375 G IVPB 30 MIN: 3.3750 g | Freq: Three times a day (TID) | INTRAVENOUS | Status: DC

## 2021-01-01 MED ORDER — MORPHINE SULFATE (PF) 4 MG/ML IV SOLN
4.0000 mg | Freq: Once | INTRAVENOUS | Status: AC
  Administered 2021-01-01: 4 mg via INTRAVENOUS
  Filled 2021-01-01: qty 1

## 2021-01-01 MED ORDER — ROCURONIUM BROMIDE 100 MG/10ML IV SOLN
INTRAVENOUS | Status: DC | PRN
  Administered 2021-01-01: 20 mg via INTRAVENOUS
  Administered 2021-01-01: 40 mg via INTRAVENOUS
  Administered 2021-01-01: 70 mg via INTRAVENOUS

## 2021-01-01 MED ORDER — LACTATED RINGERS IV SOLN: INTRAVENOUS | Status: DC | PRN

## 2021-01-01 MED ORDER — LIDOCAINE HCL (CARDIAC) PF 100 MG/5ML IV SOSY
PREFILLED_SYRINGE | INTRAVENOUS | Status: DC | PRN
  Administered 2021-01-01: 40 mg via INTRAVENOUS

## 2021-01-01 MED ORDER — PANTOPRAZOLE SODIUM 40 MG PO TBEC: 40.0000 mg | DELAYED_RELEASE_TABLET | Freq: Every day | ORAL | Status: DC

## 2021-01-01 MED ORDER — PROPOFOL 1000 MG/100ML IV EMUL: 0.0000 ug/kg/min | INTRAVENOUS | Status: DC

## 2021-01-01 MED ORDER — FENOFIBRATE 160 MG PO TABS
160.0000 mg | ORAL_TABLET | Freq: Every day | ORAL | Status: DC
  Filled 2021-01-01: qty 1

## 2021-01-01 MED ORDER — 0.9 % SODIUM CHLORIDE (POUR BTL) OPTIME
TOPICAL | Status: DC | PRN
  Administered 2021-01-01 (×5): 1000 mL

## 2021-01-01 MED ORDER — ACETAMINOPHEN 325 MG PO TABS: 650.0000 mg | ORAL_TABLET | Freq: Four times a day (QID) | ORAL | Status: DC | PRN

## 2021-01-01 MED ORDER — ATORVASTATIN CALCIUM 10 MG PO TABS: 20.0000 mg | ORAL_TABLET | Freq: Every day | ORAL | Status: DC

## 2021-01-01 MED ORDER — ALBUMIN HUMAN 5 % IV SOLN: INTRAVENOUS | Status: DC | PRN

## 2021-01-01 MED ORDER — PIPERACILLIN-TAZOBACTAM 3.375 G IVPB 30 MIN
3.3750 g | Freq: Once | INTRAVENOUS | Status: AC
  Administered 2021-01-01: 3.375 g via INTRAVENOUS
  Filled 2021-01-01: qty 50

## 2021-01-01 MED ORDER — CHLORHEXIDINE GLUCONATE 0.12% ORAL RINSE (MEDLINE KIT)
15.0000 mL | Freq: Two times a day (BID) | OROMUCOSAL | Status: DC
  Administered 2021-01-02 (×3): 15 mL via OROMUCOSAL

## 2021-01-01 MED ORDER — HYDROMORPHONE HCL 1 MG/ML IJ SOLN
1.0000 mg | Freq: Once | INTRAMUSCULAR | Status: AC
  Administered 2021-01-01: 1 mg via INTRAVENOUS
  Filled 2021-01-01: qty 1

## 2021-01-01 MED ORDER — DOCUSATE SODIUM 50 MG/5ML PO LIQD: 100.0000 mg | Freq: Two times a day (BID) | ORAL | Status: DC

## 2021-01-01 MED ORDER — PROPOFOL 10 MG/ML IV BOLUS
INTRAVENOUS | Status: AC
  Filled 2021-01-01: qty 20

## 2021-01-01 MED ORDER — SODIUM CHLORIDE 0.9% FLUSH
3.0000 mL | Freq: Two times a day (BID) | INTRAVENOUS | Status: DC
  Administered 2021-01-02 – 2021-01-18 (×26): 3 mL via INTRAVENOUS

## 2021-01-01 MED ORDER — BUPIVACAINE-EPINEPHRINE (PF) 0.25% -1:200000 IJ SOLN
INTRAMUSCULAR | Status: AC
  Filled 2021-01-01: qty 30

## 2021-01-01 MED ORDER — ONDANSETRON HCL 4 MG/2ML IJ SOLN
INTRAMUSCULAR | Status: DC | PRN
  Administered 2021-01-01: 4 mg via INTRAVENOUS

## 2021-01-01 MED ORDER — LACTATED RINGERS IV BOLUS
1000.0000 mL | Freq: Once | INTRAVENOUS | Status: AC
  Administered 2021-01-01: 1000 mL via INTRAVENOUS

## 2021-01-01 MED ORDER — PIPERACILLIN-TAZOBACTAM 3.375 G IVPB
3.3750 g | Freq: Three times a day (TID) | INTRAVENOUS | Status: AC
  Administered 2021-01-02 – 2021-01-04 (×9): 3.375 g via INTRAVENOUS
  Filled 2021-01-01 (×10): qty 50

## 2021-01-01 MED ORDER — HYDROMORPHONE HCL 1 MG/ML IJ SOLN: 0.5000 mg | INTRAMUSCULAR | Status: DC | PRN

## 2021-01-01 MED ORDER — ORAL CARE MOUTH RINSE
15.0000 mL | OROMUCOSAL | Status: DC
  Administered 2021-01-02 (×9): 15 mL via OROMUCOSAL

## 2021-01-01 MED ORDER — LOSARTAN POTASSIUM 25 MG PO TABS
12.5000 mg | ORAL_TABLET | Freq: Every day | ORAL | Status: DC
  Filled 2021-01-01: qty 0.5

## 2021-01-01 MED ORDER — ONDANSETRON HCL 4 MG/2ML IJ SOLN
4.0000 mg | Freq: Once | INTRAMUSCULAR | Status: AC
  Administered 2021-01-01: 4 mg via INTRAVENOUS
  Filled 2021-01-01: qty 2

## 2021-01-01 MED ORDER — PHENYLEPHRINE HCL-NACL 20-0.9 MG/250ML-% IV SOLN
INTRAVENOUS | Status: DC | PRN
Start: 2021-01-01 — End: 2021-01-01
  Administered 2021-01-01: 40 ug/min via INTRAVENOUS

## 2021-01-01 MED ORDER — CARVEDILOL 12.5 MG PO TABS: 6.2500 mg | ORAL_TABLET | Freq: Two times a day (BID) | ORAL | Status: DC

## 2021-01-01 MED ORDER — FENTANYL BOLUS VIA INFUSION
50.0000 ug | INTRAVENOUS | Status: DC | PRN
  Administered 2021-01-02: 50 ug via INTRAVENOUS
  Filled 2021-01-01: qty 100

## 2021-01-01 MED ORDER — FAMOTIDINE IN NACL 20-0.9 MG/50ML-% IV SOLN
20.0000 mg | Freq: Two times a day (BID) | INTRAVENOUS | Status: DC
  Administered 2021-01-02: 20 mg via INTRAVENOUS
  Filled 2021-01-01 (×2): qty 50

## 2021-01-01 MED ORDER — FENTANYL CITRATE (PF) 100 MCG/2ML IJ SOLN
INTRAMUSCULAR | Status: DC | PRN
  Administered 2021-01-01: 100 ug via INTRAVENOUS
  Administered 2021-01-01 (×5): 50 ug via INTRAVENOUS
  Administered 2021-01-01: 100 ug via INTRAVENOUS

## 2021-01-01 MED ORDER — ALLOPURINOL 100 MG PO TABS: 100.0000 mg | ORAL_TABLET | Freq: Two times a day (BID) | ORAL | Status: DC

## 2021-01-01 MED ORDER — CHLORHEXIDINE GLUCONATE CLOTH 2 % EX PADS
6.0000 | MEDICATED_PAD | Freq: Every day | CUTANEOUS | Status: DC
  Administered 2021-01-02 – 2021-02-09 (×41): 6 via TOPICAL

## 2021-01-01 MED ORDER — PROPOFOL 10 MG/ML IV BOLUS
INTRAVENOUS | Status: DC | PRN
  Administered 2021-01-01: 90 mg via INTRAVENOUS

## 2021-01-01 MED ORDER — FENTANYL CITRATE (PF) 100 MCG/2ML IJ SOLN: 50.0000 ug | Freq: Once | INTRAMUSCULAR | Status: DC

## 2021-01-01 MED ORDER — IOHEXOL 300 MG/ML  SOLN
100.0000 mL | Freq: Once | INTRAMUSCULAR | Status: AC | PRN
  Administered 2021-01-01: 100 mL via INTRAVENOUS

## 2021-01-01 MED ORDER — LIDOCAINE 2% (20 MG/ML) 5 ML SYRINGE
INTRAMUSCULAR | Status: AC
  Filled 2021-01-01: qty 5

## 2021-01-01 MED ORDER — FENTANYL 2500MCG IN NS 250ML (10MCG/ML) PREMIX INFUSION
50.0000 ug/h | INTRAVENOUS | Status: DC
  Administered 2021-01-02: 50 ug/h via INTRAVENOUS
  Filled 2021-01-01: qty 250

## 2021-01-01 MED ORDER — FUROSEMIDE 40 MG PO TABS: 40.0000 mg | ORAL_TABLET | Freq: Every day | ORAL | Status: DC

## 2021-01-01 MED ORDER — LACTATED RINGERS IV SOLN: INTRAVENOUS | Status: AC

## 2021-01-01 MED ORDER — PHENYLEPHRINE HCL (PRESSORS) 10 MG/ML IV SOLN
INTRAVENOUS | Status: DC | PRN
  Administered 2021-01-01 (×2): 80 ug via INTRAVENOUS

## 2021-01-01 MED ORDER — PROPOFOL 1000 MG/100ML IV EMUL
INTRAVENOUS | Status: AC
  Administered 2021-01-01: 15 ug/kg/min via INTRAVENOUS
  Filled 2021-01-01: qty 100

## 2021-01-01 MED ORDER — MIDAZOLAM HCL 2 MG/2ML IJ SOLN
INTRAMUSCULAR | Status: AC
  Filled 2021-01-01: qty 2

## 2021-01-01 MED ORDER — INSULIN ASPART 100 UNIT/ML IJ SOLN
0.0000 [IU] | INTRAMUSCULAR | Status: DC
Start: 2021-01-01 — End: 2021-01-02
  Administered 2021-01-01: 5 [IU] via SUBCUTANEOUS
  Administered 2021-01-02 (×3): 3 [IU] via SUBCUTANEOUS

## 2021-01-01 MED ORDER — HYPROMELLOSE (GONIOSCOPIC) 2.5 % OP SOLN: 1.0000 [drp] | Freq: Three times a day (TID) | OPHTHALMIC | Status: DC | PRN

## 2021-01-01 MED ORDER — SODIUM CHLORIDE 0.9 % IV SOLN
INTRAVENOUS | Status: DC | PRN
  Administered 2021-01-02: 500 mL via INTRAVENOUS

## 2021-01-01 MED ORDER — ALBUTEROL SULFATE (2.5 MG/3ML) 0.083% IN NEBU
3.0000 mL | INHALATION_SOLUTION | Freq: Four times a day (QID) | RESPIRATORY_TRACT | Status: DC | PRN
  Administered 2021-01-04 – 2021-01-13 (×3): 3 mL via RESPIRATORY_TRACT
  Filled 2021-01-01 (×3): qty 3

## 2021-01-01 MED ORDER — LACTATED RINGERS IV SOLN: INTRAVENOUS | Status: DC

## 2021-01-01 MED ORDER — BUPIVACAINE-EPINEPHRINE 0.25% -1:200000 IJ SOLN
INTRAMUSCULAR | Status: DC | PRN
  Administered 2021-01-01: 8 mL

## 2021-01-01 MED ORDER — SUCCINYLCHOLINE CHLORIDE 200 MG/10ML IV SOSY
PREFILLED_SYRINGE | INTRAVENOUS | Status: DC | PRN
  Administered 2021-01-01: 120 mg via INTRAVENOUS

## 2021-01-01 MED ORDER — SODIUM CHLORIDE 0.9 % IV BOLUS
1000.0000 mL | Freq: Once | INTRAVENOUS | Status: AC
  Administered 2021-01-01: 1000 mL via INTRAVENOUS

## 2021-01-01 MED ORDER — POLYETHYLENE GLYCOL 3350 17 G PO PACK: 17.0000 g | PACK | Freq: Every day | ORAL | Status: DC

## 2021-01-01 MED ORDER — ACETAMINOPHEN 650 MG RE SUPP
650.0000 mg | Freq: Once | RECTAL | Status: AC
  Administered 2021-01-01: 650 mg via RECTAL
  Filled 2021-01-01: qty 1

## 2021-01-01 MED ORDER — ACETAMINOPHEN 650 MG RE SUPP
650.0000 mg | Freq: Four times a day (QID) | RECTAL | Status: DC | PRN
Start: 2021-01-01 — End: 2021-01-05
  Administered 2021-01-02: 650 mg via RECTAL
  Filled 2021-01-01: qty 1

## 2021-01-01 SURGICAL SUPPLY — 89 items
ADH SKN CLS APL DERMABOND .7 (GAUZE/BANDAGES/DRESSINGS) ×1
APL PRP STRL LF DISP 70% ISPRP (MISCELLANEOUS) ×1
APPLIER CLIP ROT 10 11.4 M/L (STAPLE)
APR CLP MED LRG 11.4X10 (STAPLE)
BAG COUNTER SPONGE SURGICOUNT (BAG) ×2 IMPLANT
BAG SPEC RTRVL 10 TROC 200 (ENDOMECHANICALS) ×1
BAG SPNG CNTER NS LX DISP (BAG) ×1
BAG SURGICOUNT SPONGE COUNTING (BAG) ×1
BLADE CLIPPER SURG (BLADE) IMPLANT
BLADE SURG 10 STRL SS (BLADE) ×2 IMPLANT
BNDG GAUZE ELAST 4 BULKY (GAUZE/BANDAGES/DRESSINGS) ×2 IMPLANT
CANISTER SUCT 3000ML PPV (MISCELLANEOUS) ×3 IMPLANT
CHLORAPREP W/TINT 26 (MISCELLANEOUS) ×3 IMPLANT
CLIP APPLIE ROT 10 11.4 M/L (STAPLE) IMPLANT
COVER MAYO STAND STRL (DRAPES) ×2 IMPLANT
COVER SURGICAL LIGHT HANDLE (MISCELLANEOUS) ×3 IMPLANT
CUTTER FLEX LINEAR 45M (STAPLE) ×3 IMPLANT
DERMABOND ADVANCED (GAUZE/BANDAGES/DRESSINGS) ×2
DERMABOND ADVANCED .7 DNX12 (GAUZE/BANDAGES/DRESSINGS) ×1 IMPLANT
DRAIN CHANNEL 19F RND (DRAIN) ×2 IMPLANT
DRAPE LAPAROSCOPIC ABDOMINAL (DRAPES) ×3 IMPLANT
DRAPE WARM FLUID 44X44 (DRAPES) ×1 IMPLANT
DRSG OPSITE POSTOP 4X10 (GAUZE/BANDAGES/DRESSINGS) IMPLANT
DRSG OPSITE POSTOP 4X8 (GAUZE/BANDAGES/DRESSINGS) IMPLANT
DRSG PAD ABDOMINAL 8X10 ST (GAUZE/BANDAGES/DRESSINGS) ×2 IMPLANT
ELECT BLADE 4.0 EZ CLEAN MEGAD (MISCELLANEOUS) ×3
ELECT BLADE 6.5 EXT (BLADE) IMPLANT
ELECT CAUTERY BLADE 6.4 (BLADE) ×3 IMPLANT
ELECT REM PT RETURN 9FT ADLT (ELECTROSURGICAL) ×3
ELECTRODE BLDE 4.0 EZ CLN MEGD (MISCELLANEOUS) IMPLANT
ELECTRODE REM PT RTRN 9FT ADLT (ELECTROSURGICAL) ×1 IMPLANT
EVACUATOR SILICONE 100CC (DRAIN) ×2 IMPLANT
GAUZE SPONGE 4X4 12PLY STRL (GAUZE/BANDAGES/DRESSINGS) ×2 IMPLANT
GLOVE SRG 8 PF TXTR STRL LF DI (GLOVE) ×1 IMPLANT
GLOVE SURG ENC MOIS LTX SZ8 (GLOVE) ×3 IMPLANT
GLOVE SURG POLYISO LF SZ7 (GLOVE) ×4 IMPLANT
GLOVE SURG POLYISO LF SZ8 (GLOVE) ×4 IMPLANT
GLOVE SURG UNDER POLY LF SZ7 (GLOVE) ×4 IMPLANT
GLOVE SURG UNDER POLY LF SZ8 (GLOVE) ×9
GOWN STRL REUS W/ TWL LRG LVL3 (GOWN DISPOSABLE) ×2 IMPLANT
GOWN STRL REUS W/ TWL XL LVL3 (GOWN DISPOSABLE) ×1 IMPLANT
GOWN STRL REUS W/TWL LRG LVL3 (GOWN DISPOSABLE) ×6
GOWN STRL REUS W/TWL XL LVL3 (GOWN DISPOSABLE) ×6
HANDLE SUCTION POOLE (INSTRUMENTS) ×1 IMPLANT
KIT BASIN OR (CUSTOM PROCEDURE TRAY) ×3 IMPLANT
KIT TURNOVER KIT B (KITS) ×3 IMPLANT
LIGASURE IMPACT 36 18CM CVD LR (INSTRUMENTS) ×2 IMPLANT
NEEDLE 22X1 1/2 (OR ONLY) (NEEDLE) ×3 IMPLANT
NS IRRIG 1000ML POUR BTL (IV SOLUTION) ×6 IMPLANT
PACK GENERAL/GYN (CUSTOM PROCEDURE TRAY) ×3 IMPLANT
PAD ARMBOARD 7.5X6 YLW CONV (MISCELLANEOUS) ×6 IMPLANT
PENCIL BUTTON HOLSTER BLD 10FT (ELECTRODE) ×2 IMPLANT
PENCIL SMOKE EVACUATOR (MISCELLANEOUS) ×3 IMPLANT
POUCH RETRIEVAL ECOSAC 10 (ENDOMECHANICALS) ×1 IMPLANT
POUCH RETRIEVAL ECOSAC 10MM (ENDOMECHANICALS) ×3
RELOAD 45 VASCULAR/THIN (ENDOMECHANICALS) IMPLANT
RELOAD PROXIMATE 75MM BLUE (ENDOMECHANICALS) ×6 IMPLANT
RELOAD STAPLE 45 2.5 WHT GRN (ENDOMECHANICALS) IMPLANT
RELOAD STAPLE 45 3.5 BLU ETS (ENDOMECHANICALS) IMPLANT
RELOAD STAPLE 75 3.8 BLU REG (ENDOMECHANICALS) IMPLANT
RELOAD STAPLE TA45 3.5 REG BLU (ENDOMECHANICALS) IMPLANT
SCISSORS LAP 5X35 DISP (ENDOMECHANICALS) IMPLANT
SET IRRIG TUBING LAPAROSCOPIC (IRRIGATION / IRRIGATOR) ×3 IMPLANT
SET TUBE SMOKE EVAC HIGH FLOW (TUBING) ×3 IMPLANT
SHEARS HARMONIC ACE PLUS 36CM (ENDOMECHANICALS) ×3 IMPLANT
SPECIMEN JAR LARGE (MISCELLANEOUS) ×2 IMPLANT
SPECIMEN JAR SMALL (MISCELLANEOUS) ×3 IMPLANT
SPONGE T-LAP 18X18 ~~LOC~~+RFID (SPONGE) ×8 IMPLANT
STAPLER PROXIMATE 75MM BLUE (STAPLE) ×2 IMPLANT
STAPLER VISISTAT 35W (STAPLE) ×5 IMPLANT
SUCTION POOLE HANDLE (INSTRUMENTS) ×6
SUT ETHILON 2 0 FS 18 (SUTURE) ×2 IMPLANT
SUT PDS AB 1 TP1 96 (SUTURE) ×10 IMPLANT
SUT SILK 2 0 SH CR/8 (SUTURE) ×9 IMPLANT
SUT SILK 2 0 TIES 10X30 (SUTURE) ×3 IMPLANT
SUT SILK 3 0 SH CR/8 (SUTURE) ×5 IMPLANT
SUT SILK 3 0 TIES 10X30 (SUTURE) ×3 IMPLANT
SUT VIC AB 4-0 PS2 27 (SUTURE) ×3 IMPLANT
TOWEL GREEN STERILE (TOWEL DISPOSABLE) ×3 IMPLANT
TOWEL GREEN STERILE FF (TOWEL DISPOSABLE) ×3 IMPLANT
TRAY FOLEY MTR SLVR 16FR STAT (SET/KITS/TRAYS/PACK) ×2 IMPLANT
TRAY FOLEY W/BAG SLVR 16FR (SET/KITS/TRAYS/PACK) ×3
TRAY FOLEY W/BAG SLVR 16FR ST (SET/KITS/TRAYS/PACK) ×1 IMPLANT
TRAY LAPAROSCOPIC MC (CUSTOM PROCEDURE TRAY) ×3 IMPLANT
TROCAR XCEL 12X100 BLDLESS (ENDOMECHANICALS) ×3 IMPLANT
TROCAR XCEL BLUNT TIP 100MML (ENDOMECHANICALS) ×5 IMPLANT
TROCAR XCEL NON-BLD 5MMX100MML (ENDOMECHANICALS) ×3 IMPLANT
WATER STERILE IRR 1000ML POUR (IV SOLUTION) ×3 IMPLANT
YANKAUER SUCT BULB TIP NO VENT (SUCTIONS) ×2 IMPLANT

## 2021-01-01 NOTE — Progress Notes (Signed)
Notified provider and bedside nurse of need to order repeat lactic acid.  

## 2021-01-01 NOTE — Op Note (Addendum)
01/01/2021  11:01 PM  PATIENT:  Travis [name]  65 y.o. male  PRE-OPERATIVE DIAGNOSIS:  perforated appendix  POST-OPERATIVE DIAGNOSIS:  PERFORATED APPENDIX, ABDOMINAL ABSCESS  PROCEDURE:  Procedure(s): DIAGNOSTIC LAPAROSCOPY; EXPLORATORY LAPAROTOMY, ILECECECTOMY, DRAINAGE OF INTRA-ABDOMINAL ABSCESS  SURGEON:  Surgeon(s): Georganna Skeans, MD  ASSISTANTS: none   ANESTHESIA:   local and general  EBL:  Total I/O In: 2250.8 [I.V.:1000; IV Piggyback:1250.8] Out: 100 [Blood:100]  BLOOD ADMINISTERED:none  DRAINS: (1) Jackson-Pratt drain(s) with closed bulb suction in the R abdomen    SPECIMEN:  Excision  DISPOSITION OF SPECIMEN:  PATHOLOGY  COUNTS:  YES  DICTATION: .Dragon Dictation Findings: purulent and feculent peritonitis, necrotic base of appendix with frank perforation  Procedure in detail: Informed consent was obtained.  He received intravenous antibiotics.  He was brought to the operating room and general endotracheal anesthesia was administered by the anesthesia staff.  A Foley catheter was placed by nursing.  His abdomen was prepped and draped in a sterile fashion.  We did a timeout procedure.The supraumbilical region was infiltrated with local. Supraumbilical incision was made. Subcutaneous tissues were dissected down revealing the anterior fascia. This was divided sharply along the midline. Peritoneal cavity was entered under direct vision without complication. A 0 Vicryl pursestring was placed around the fascial opening. Hassan trocar was inserted into the abdomen. The abdomen was insufflated with carbon dioxide in standard fashion. Under direct vision a 5 mm right abdominal port was placed.  Local was used at the port site.  Laparoscopic exploration revealed extensive purulent peritonitis.  There was dense inflammation in the right lower quadrant area.  I was not able to safely identify and mobilize the cecum at all so decision was made to convert to an open procedure.  And  the insufflation was evacuated.  A midline incision was made from above the umbilicus inferiorly.  This included the [place] port site.  The fascia was opened to the length of the incision and the abdomen was explored.  There was severe inflammation with purulent and feculent peritonitis in the right lower quadrant.  The inflammation tract involving most of the abdomen.  I was able to identify the cecum.  The appendix was long and stuck to the terminal ileum but the base was necrotic and frankly perforated with leakage of stool.  I did not feel I could safely close this area if I were to do an appendectomy.  I decided to do an ileocecectomy.  First, I mobilized the cecum from the lateral peritoneal attachments.  Identified the ureter and stayed well above that.  There was considerable abscess down in the pelvis and in this area.  I sent that for cultures and washed that area out.  I mobilized the terminal ileum as well.  I divided the distal ileum with a GIA 75 stapler.  I took down the mesentery of the terminal ileum and the cecum using the LigaSure.  I also used interrupted 2-0 silk sutures for the ileocolic vessels achieving excellent hemostasis.  The appendix was removed en bloc with the ileocecectomy.  Specimen was sent to pathology.  I irrigated the area.  I reinforced the R colon staple line with multiple 2-0 silk sutures.  I then performed a side to side anastomosis between the distal ileum and the proximal right colon using a GIA 75 stapler.  I closed the common defect with multiple interrupted 2-0 silk sutures.  I placed an apical stitch of 2-0 silk as well.  The anastomosis was pink and  viable.  It was widely patent.  I closed the mesentery with multiple interrupted 2-0 silk sutures.  I copiously irrigated the abdomen with multiple liters of warm saline.  I placed a 80 [place] Blake drain through the previous right abdominal port site.  This went down into the pelvis.  It was secured with nylon.  The  anastomosis was inspected again.  There was good hemostasis and it was viable.  I brought the omentum down into appropriate position and placed it over and around the anastomosis as well.  We then changed our gowns, gloves, instruments, and drapes in accordance with the colon protocol.  I closed his abdomen with running #1 looped PDS.  Subcutaneous tissues were irrigated and I placed a sterile wet-to-dry dressing.  All counts were correct.  He tolerated the procedure well.  He remained in critical condition on the ventilator and was taken directly to the intensive care unit.  I did consult the critical care medicine service since he was previously admitted by the Hospitalist Service.  There were no apparent complications.   Upon entering the abdomen (organ space), I encountered feculent peritonitis.  CASE DATA:  Type of patient?: DOW CASE (Surgical Hospitalist [facility])  Status of Case? EMERGENT Add On  Infection Present At Time Of Surgery (PATOS)?  FECULENT PERITONITIS     PATIENT DISPOSITION:  ICU - intubated and critically ill.   Delay start of Pharmacological VTE agent (>24hrs) due to surgical blood loss or risk of bleeding:  no  Georganna Skeans, MD, MPH, FACS Pager: [phone]  10/9/202211:01 PM

## 2021-01-01 NOTE — ED Notes (Signed)
XR at bedside

## 2021-01-01 NOTE — Transfer of Care (Signed)
Immediate Anesthesia Transfer of Care Note  Patient: Travis [name]  Procedure(s) Performed: DIAGNOSTIC LAPAROSCOPY; EXPLORATORY LAPAROTOMY, ILEUCEECTOMY, DRAINAGE OF ABDOMINAL ABSCESS0  Patient Location: ICU  Anesthesia Type:General  Level of Consciousness: Patient remains intubated per anesthesia plan  Airway & Oxygen Therapy: Patient remains intubated per anesthesia plan and Patient placed on Ventilator (see vital sign flow sheet for setting)  Post-op Assessment: Report given to RN and Post -op Vital signs reviewed and stable  Post vital signs: Reviewed and stable  Last Vitals:  Vitals Value Taken Time  BP    Temp    Pulse 110 01/01/21 2344  Resp 15 01/01/21 2344  SpO2 100 % 01/01/21 2344  Vitals shown include unvalidated device data.  Last Pain:  Vitals:   01/01/21 1929  TempSrc:   PainSc: 8          Complications: No notable events documented.

## 2021-01-01 NOTE — ED Triage Notes (Signed)
Pt to triage via [facility] from home.  REports RLQ pain since 8am.  Nausea yesterday.  Pt reports constipation with small BM yesterday.  Prior to that normal bowel movements.  Pt moaning in pain.

## 2021-01-01 NOTE — Anesthesia Preprocedure Evaluation (Addendum)
Anesthesia Evaluation  Patient identified by MRN, date of birth, ID band Patient awake    Reviewed: Allergy & Precautions, NPO status , Patient's Chart, lab work & pertinent test results  Airway Mallampati: I  TM Distance: >3 FB Neck ROM: Full    Dental  (+) Edentulous Upper, Edentulous Lower   Pulmonary asthma , COPD, Current Smoker and Patient abstained from smoking.,    breath sounds clear to auscultation       Cardiovascular hypertension, + CAD   Rhythm:Regular Rate:Tachycardia  Echo:  1. Compared to echo done in March 2021 EF improved and thickening of MV  and subchordal apparatus more prominent.  2. Left ventricular ejection fraction, by estimation, is 45 to 50%. The  left ventricle has mildly decreased function. The left ventricle  demonstrates regional wall motion abnormalities (see scoring  diagram/findings for description). There is mild left  ventricular hypertrophy. Left ventricular diastolic parameters are  indeterminate.  3. Enlarged basal annulus . Right ventricular systolic function is mildly  reduced. The right ventricular size is mildly enlarged.  4. Right atrial size was mildly dilated.  5. MV is thickened cannot r/o SBE but likely reducndant sub chordal  apparatus consider TEE to further evaluate if suspicion of SBE high . The  mitral valve is abnormal. Trivial mitral valve regurgitation. No evidence  of mitral stenosis.  6. The aortic valve was not well visualized. Aortic valve regurgitation  is not visualized. Mild to moderate aortic valve sclerosis/calcification  is present, without any evidence of aortic stenosis.  7. The inferior vena cava is normal in size with greater than 50%  respiratory variability, suggesting right atrial pressure of 3 mmHg.    Neuro/Psych TIA   GI/Hepatic   Endo/Other  diabetes, Type 2  Renal/GU Renal disease     Musculoskeletal  (+) Arthritis ,    Abdominal Normal abdominal exam  (+)   Peds  Hematology   Anesthesia Other Findings   Reproductive/Obstetrics                           Anesthesia Physical Anesthesia Plan  ASA: 3  Anesthesia Plan: General   Post-op Pain Management:    Induction: Rapid sequence, Cricoid pressure planned and Intravenous  PONV Risk Score and Plan: 2 and Ondansetron and Midazolam  Airway Management Planned: Oral ETT  Additional Equipment: Arterial line  Intra-op Plan:   Post-operative Plan: Possible Post-op intubation/ventilation  Informed Consent: I have reviewed the patients History and Physical, chart, labs and discussed the procedure including the risks, benefits and alternatives for the proposed anesthesia with the patient or authorized representative who has indicated his/her understanding and acceptance.     Dental advisory given  Plan Discussed with: CRNA  Anesthesia Plan Comments:        Anesthesia Quick Evaluation

## 2021-01-01 NOTE — Anesthesia Procedure Notes (Signed)
Procedure Name: Intubation Date/Time: 01/01/2021 8:49 PM Performed by: Krisalyn Yankowski T, CRNA Pre-anesthesia Checklist: Patient identified, Emergency Drugs available, Suction available and Patient being monitored Patient Re-evaluated:Patient Re-evaluated prior to induction Oxygen Delivery Method: Circle system utilized Preoxygenation: Pre-oxygenation with 100% oxygen Induction Type: IV induction Ventilation: Mask ventilation without difficulty Laryngoscope Size: Mac and 4 Grade View: Grade I Tube type: Oral Tube size: 7.5 mm Number of attempts: 1 Airway Equipment and Method: Stylet and Oral airway Placement Confirmation: ETT inserted through vocal cords under direct vision, positive ETCO2 and breath sounds checked- equal and bilateral Secured at: 23 cm Tube secured with: Tape Dental Injury: Teeth and Oropharynx as per pre-operative assessment

## 2021-01-01 NOTE — ED Provider Notes (Signed)
Emergency Medicine Provider Triage Evaluation Note  ALEKSANDR SWETT , a 65 y.o. male  was evaluated in triage.  Pt complains of abdominal pain, onset last night, progressively worsening, sharp  in nature. Constipated. Denies vomiting, nausea.  No prior abdominal surgeries.   Review of Systems  Positive: Abdominal pain, constipation  Negative: vomiting  Physical Exam  There were no vitals taken for this visit. Gen:   Awake, no distress   Resp:  Normal effort  MSK:   Moves extremities without difficulty  Other:  Abdominal pain without rebound tenderness  Medical Decision Making  Medically screening exam initiated at 10:21 AM.  Appropriate orders placed.  [name] was informed that the remainder of the evaluation will be completed by another provider, this initial triage assessment does not replace that evaluation, and the importance of remaining in the ED until their evaluation is complete.     Tacy Learn, PA-C 01/01/21 1022    Regan Lemming, MD 01/01/21 [phone]

## 2021-01-01 NOTE — Progress Notes (Addendum)
Elink following sepsis 

## 2021-01-01 NOTE — ED Provider Notes (Signed)
  Physical Exam  BP 128/83   Pulse (!) 135   Temp 99.7 F (37.6 C) (Oral)   Resp (!) 32   SpO2 93%   Physical Exam  ED Course/Procedures     Procedures  MDM  Care assumed at 3 pm. Patient here with abdominal pain.  Acute onset since yesterday.  Signout pending CT abdomen pelvis.  5:06 PM Lactate is increasing to 3 from 2.3.  CT showed perforated appendicitis with free air.  Consulted Dr. Redmond Pulling from surgery to see patient.  Patient already received Zosyn  5:58 PM Dr. Redmond Pulling saw patient. Plans to take patient to OR tonight. Request medicine admission given multiple medical problems. Will likely need stepdown bed.   CRITICAL CARE Performed by: Wandra Arthurs   Total critical care time: 30 minutes  Critical care time was exclusive of separately billable procedures and treating other patients.  Critical care was necessary to treat or prevent imminent or life-threatening deterioration.  Critical care was time spent personally by me on the following activities: development of treatment plan with patient and/or surrogate as well as nursing, discussions with consultants, evaluation of patient's response to treatment, examination of patient, obtaining history from patient or surrogate, ordering and performing treatments and interventions, ordering and review of laboratory studies, ordering and review of radiographic studies, pulse oximetry and re-evaluation of patient's condition.      Drenda Freeze, MD 01/01/21 [phone]

## 2021-01-01 NOTE — Consult Note (Addendum)
CC: abdominal pain   Requesting provider: Dr Darl Householder  HPI: Travis [name] is an 65 y.o. male who is here for worsening right lower quadrant abdominal pain that started yesterday evening.  Patient has a past medical history of hypertension, alcoholic cardiomyopathy, coronary artery disease, chronic kidney disease stage IIa, COPD who states he was in his usual state of health until last night when he started to develop right lower quadrant abdominal pain.  It was persistent and not getting better so he came to the emergency room this morning.  He states that he ate a hamburger Friday night and really could not get it out or passed that.  No nausea or vomiting.  Subjective fever and chills.  He smokes 1 pack/day.  No alcohol use.  Remote history of TIA.  Denies any current TIA symptoms.  Has a remote history of gastric ulcer. no dysuria or hematuria.  No blood thinner.  Denies prior abdominal surgery.  Reports he has had a colonoscopy within the past few years.  Colonoscopy from 2018 showed diverticuli in the left and right colon.  Patient was found to have tachycardia in the ER and elevated lactate.  Code sepsis was initiated.  Patient has received vancomycin and a fluid bolus.  Normal white blood cell  Denies any chest pain or chest pressure  Past Medical History:  Diagnosis Date   Alcohol abuse    Alcoholic cardiomyopathy ([place]) 03/15/2013   a. 05/2019 Echo: EF 35-40%; b. 10/2019 Echo: EF 45-50%.   Allergy    Asthma    Central retinal artery occlusion of left eye 09/13/13   Chronic anemia    Clotting disorder ([place])    Diabetes mellitus, type 2 ([place])    pt reports his DM is gone   GI bleed    15 years ago   Gout    Hemoptysis    secondary to pulmonary edema   HFimpEF (heart failure with improved ejection fraction) ([place])    a. 12/2010 Echo: EF 20-25%; b 08/2013 Echo: EF 45-50%; c. 01/2015 Echo: EF 25-30%; d. 07/2016 Echo: EF 35-40%; e. 05/2019 Echo: EF 35-40%; d. 10/2019 Echo: EF 45-50%, mild LVH,  mild red RV fxn, mildl dil RA, Triv MR. Mild to mod Ao Sclerosis w/o stenosis.   Hyperlipidemia    Hypertension    Nonischemic cardiomyopathy ([facility])    Nonobstructive Coronary artery disease    a. 01/2015 Cath: LM nl, LAD 30p/m, LCX nl, RCA 10p/m, RPDA min irregs.   Osteoarthritis    Pneumonia    TIA (transient ischemic attack)    Tobacco abuse    Vision loss    peripherial vision only left eye.Central Retinal  artery occusion     Past Surgical History:  Procedure Laterality Date   BIOPSY  11/25/2019   Procedure: BIOPSY;  Surgeon: Ladene Artist, MD;  Location: [facility];  Service: Endoscopy;;   CARDIAC CATHETERIZATION     CARDIAC CATHETERIZATION N/A 01/28/2015   Procedure: Left Heart Cath and Coronary Angiography;  Surgeon: Wellington Hampshire, MD;  Location: [facility];  Service: Cardiovascular;  Laterality: N/A;   COLONOSCOPY     ESOPHAGOGASTRODUODENOSCOPY (EGD) WITH PROPOFOL N/A 11/25/2019   Procedure: ESOPHAGOGASTRODUODENOSCOPY (EGD) WITH PROPOFOL;  Surgeon: Ladene Artist, MD;  Location[facility];  Service: Endoscopy;  Laterality: N/A;   HIP ARTHROPLASTY Right 03/15/2013   Procedure: ARTHROPLASTY BIPOLAR HIP;  Surgeon: [name], MD;  Location: [facility];  Service: Orthopedics;  Laterality: Right;  TOTAL SHOULDER ARTHROPLASTY Right 09/01/2015   Procedure: RIGHT TOTAL SHOULDER ARTHROPLASTY;  Surgeon: [name], MD;  Location: [facility];  Service: Orthopedics;  Laterality: Right;    Family History  Problem Relation Age of Onset   Diabetes Mother    Hypertension Mother    Diabetes Father    Hypertension Father    Diabetes Sister    Dementia Brother    Cancer Neg Hx    Heart disease Neg Hx    Stroke Neg Hx    Colon cancer Neg Hx    Esophageal cancer Neg Hx    Rectal cancer Neg Hx    Stomach cancer Neg Hx     Social:  reports that he has been smoking cigarettes. He has a 22.50 pack-year smoking history. He has never used smokeless tobacco. He reports  that he does not currently use alcohol after a past usage of about 1.0 standard drink per week. He reports current drug use. Drug: Marijuana.  Allergies: No Known Allergies  Medications: I have reviewed the patient's current medications.   ROS - all of the below systems have been reviewed with the patient and positives are indicated with bold text General: chills, fever or night sweats Eyes: blurry vision or double vision ENT: epistaxis or sore throat Allergy/Immunology: itchy/watery eyes or nasal congestion Hematologic/Lymphatic: bleeding problems, blood clots or swollen lymph nodes Endocrine: temperature intolerance or unexpected weight changes Breast: new or changing breast lumps or nipple discharge Resp: cough, shortness of breath, or wheezing CV: chest pain or dyspnea on exertion GI: as per HPI GU: dysuria, trouble voiding, or hematuria MSK: joint pain or joint stiffness Neuro: TIA or stroke symptoms Derm: pruritus and skin lesion changes Psych: anxiety and depression  PE Blood pressure 122/84, pulse (!) 133, temperature 99.7 F (37.6 C), temperature source Oral, resp. rate (!) 26, SpO2 93 %. Constitutional: Appears uncomfortable; conversant; no deformities Eyes: Moist conjunctiva; no lid lag; anicteric; PERRL Neck: Trachea midline; no thyromegaly Lungs: Normal respiratory effort; no tactile fremitus CV: Tachycardia; no palpable thrills; no pitting edema GI: Abd soft, significant tenderness to palpation in the right lower quadrant.  Positive peritonitis, positive guarding; no palpable hepatosplenomegaly MSK: Normal gait; no clubbing/cyanosis Psychiatric: Appropriate affect; alert and oriented x3 Lymphatic: No palpable cervical or axillary lymphadenopathy Skin: No rash or jaundice  Results for orders placed or performed during the hospital encounter of 01/01/21 (from the past 48 hour(s))  CBC with Differential     Status: Abnormal   Collection Time: 01/01/21 10:23 AM   Result Value Ref Range   WBC 9.1 4.0 - 10.5 K/uL   RBC 5.26 4.22 - 5.81 MIL/uL   Hemoglobin 16.7 13.0 - 17.0 g/dL   HCT 52.8 (H) 39.0 - 52.0 %   MCV 100.4 (H) 80.0 - 100.0 fL   MCH 31.7 26.0 - 34.0 pg   MCHC 31.6 30.0 - 36.0 g/dL   RDW 14.9 11.5 - 15.5 %   Platelets 209 150 - 400 K/uL   nRBC 0.0 0.0 - 0.2 %   Neutrophils Relative % 80 %   Neutro Abs 7.2 1.7 - 7.7 K/uL   Lymphocytes Relative 15 %   Lymphs Abs 1.3 0.7 - 4.0 K/uL   Monocytes Relative 3 %   Monocytes Absolute 0.3 0.1 - 1.0 K/uL   Eosinophils Relative 2 %   Eosinophils Absolute 0.2 0.0 - 0.5 K/uL   Basophils Relative 0 %   Basophils Absolute 0.0 0.0 - 0.1 K/uL   Immature  Granulocytes 0 %   Abs Immature Granulocytes 0.02 0.00 - 0.07 K/uL    Comment: Performed at [facility], [place] [address], [place], [place] [address]  Comprehensive metabolic panel     Status: Abnormal   Collection Time: 01/01/21 10:23 AM  Result Value Ref Range   Sodium 134 (L) 135 - 145 mmol/L   Potassium 5.2 (H) 3.5 - 5.1 mmol/L   Chloride 98 98 - 111 mmol/L   CO2 19 (L) 22 - 32 mmol/L   Glucose, Bld 279 (H) 70 - 99 mg/dL    Comment: Glucose reference range applies only to samples taken after fasting for at least 8 hours.   BUN 20 8 - 23 mg/dL   Creatinine, Ser 1.51 (H) 0.61 - 1.24 mg/dL   Calcium 10.4 (H) 8.9 - 10.3 mg/dL   Total Protein 8.5 (H) 6.5 - 8.1 g/dL   Albumin 4.5 3.5 - 5.0 g/dL   AST 24 15 - 41 U/L   ALT 19 0 - 44 U/L   Alkaline Phosphatase 103 38 - 126 U/L   Total Bilirubin 1.2 0.3 - 1.2 mg/dL   GFR, Estimated 51 (L) >60 mL/min    Comment: (NOTE) Calculated using the CKD-EPI Creatinine Equation (2021)    Anion gap 17 (H) 5 - 15    Comment: Performed at [facility], [place] [address] [place], [place] [address]  Lipase, blood     Status: None   Collection Time: 01/01/21 10:23 AM  Result Value Ref Range   Lipase 31 11 - 51 U/L    Comment: Performed at [facility] [address] [place], [place]  [address]  Lactic acid, plasma     Status: Abnormal   Collection Time: 01/01/21 10:28 AM  Result Value Ref Range   Lactic Acid, Venous 2.3 (HH) 0.5 - 1.9 mmol/L    Comment: CRITICAL RESULT CALLED TO, READ BACK BY AND VERIFIED WITH: Benjamin Stain RN BY SSTEPHENS [id number] Performed at [facility], [place] [address] [place], [place] [address]   Lactic acid, plasma     Status: Abnormal   Collection Time: 01/01/21 12:28 PM  Result Value Ref Range   Lactic Acid, Venous 3.0 (HH) 0.5 - 1.9 mmol/L    Comment: CRITICAL VALUE NOTED.  VALUE IS CONSISTENT WITH PREVIOUSLY REPORTED AND CALLED VALUE. Performed at [facility], [place] [address], [place], [place] [address]   Protime-INR     Status: None   Collection Time: 01/01/21  1:40 PM  Result Value Ref Range   Prothrombin Time 14.4 11.4 - 15.2 seconds   INR 1.1 0.8 - 1.2    Comment: (NOTE) INR goal varies based on device and disease states. Performed at [facility], [place] [address] [place], [place] [address]     CT Abdomen Pelvis W Contrast  Addendum Date: 01/01/2021   ADDENDUM REPORT: 01/01/2021 16:38 ADDENDUM: These results were called by telephone at the time of interpretation on 01/01/2021 at 4:35 pm to provider Dr. Darl Householder , Who verbally acknowledged these results. Electronically Signed   By: Fidela Salisbury M.D.   On: 01/01/2021 16:38   Result Date: 01/01/2021 CLINICAL DATA:  Abdominal pain. EXAM: CT ABDOMEN AND PELVIS WITH CONTRAST TECHNIQUE: Multidetector CT imaging of the abdomen and pelvis was performed using the standard protocol following bolus administration of intravenous contrast. CONTRAST:  160m OMNIPAQUE IOHEXOL 300 MG/ML  SOLN COMPARISON:  None. FINDINGS: Lower chest: No acute  abnormality. Hepatobiliary: No focal liver abnormality is seen. No gallstones, gallbladder wall thickening, or biliary dilatation. Pancreas: Unremarkable. No pancreatic ductal dilatation or surrounding inflammatory changes. Spleen: Normal in size  without focal abnormality. Adrenals/Urinary Tract: Adrenal glands are unremarkable. Kidneys are normal, without renal calculi, focal lesion, or hydronephrosis. Bladder is unremarkable. Stomach/Bowel: The stomach and small bowel are normal. There is circumferential mucosal thickening at the base of the appendix and the cecum at the ileocecal junction. There is also indistinctness of the bowel wall of the proximal appendix. The distal appendix is decompressed, contains gas and fecal matter and overall has normal appearance. There is small amount of free gas and free fluid at the base of appendix and surrounding the cecum. The free gas appears to originate from the base of the appendix. Inflammatory fat stranding surrounds the base of the appendix and distal cecum. Vascular/Lymphatic: Aortic atherosclerosis. No enlarged abdominal or pelvic lymph nodes. Reproductive: Prostate is unremarkable. Other: No abdominal wall hernia or abnormality. Small amount of free fluid in the right lower quadrant of the abdomen along the pericolic gutter. Additional small foci of gas scattered in the mesentery of the right lower and upper quadrants, and in perihepatic location. Musculoskeletal: Advanced spondylosis of lower lumbosacral spine. IMPRESSION: 1. Circumferential mucosal thickening and indistinctness of the bowel wall at the base of the appendix and the distal cecum at the ileocecal junction. There is an associated milder mucosal thickening and indistinctness of the terminal ileum. The distal appendix is decompressed, contains gas and fecal matter and overall has normal appearance. Small amount of free fluid and free gas in the right lower quadrant of the abdomen. These findings are likely due to acute appendicitis with perforation. It is unclear however whether the mucosal thickening of the cecum is reactive or is the source of appendiceal obstruction. Underlying malignancy such as mucinous tumor is in the differential  diagnosis. 2. Small foci of free intraperitoneal gas and small amount of fluid scattered in the mesentery of the right lower and upper quadrants of the abdomen, and in perihepatic location. Aortic Atherosclerosis (ICD10-I70.0). Electronically Signed: By: Fidela Salisbury M.D. On: 01/01/2021 16:28   DG Abd Portable 1V  Result Date: 01/01/2021 CLINICAL DATA:  Abdominal pain. EXAM: PORTABLE ABDOMEN - 1 VIEW COMPARISON:  September 06, 2015 FINDINGS: The bowel gas pattern is normal. No radio-opaque calculi or other significant radiographic abnormality are seen. Osteoarthritic changes of the lumbosacral spine. Partially visualized right hip prosthesis. IMPRESSION: Nonobstructive bowel gas pattern. Electronically Signed   By: Fidela Salisbury M.D.   On: 01/01/2021 15:02    Imaging: Personally reviewed  A/P: NELSON FREDERIQUE is an 65 y.o. male with  Perforated acute appendicitis, possible underlying malignancy Peritonitis History of alcoholic cardiomyopathy Hypertension Coronary artery disease Chronic kidney disease COPD History of TIA Tobacco abuse Diet-controlled diabetes mellitus History of gastric ulcer  The patient appears to have perforated his appendix at its base at its confluence with the cecum.  There is a fair amount of cecal wall thickening as well as some terminal ileum thickening.  There is no drainable abscess.  The patient has an elevated lactate.  Given his abdominal exam I do not believe nonoperative management is a great option.  I believe that he will need to go to the operating room this evening.  I think there is a high likelihood patient may require ileocecectomy given the amount of inflammation around the base of the appendix and the cecum.  Unclear if we could get  a staple line across the base of the appendix since it appears to be perforated in that location.   Would attempt lap appy with possible exp lap, bowel resection.   Patient's COVID test is pending  Continue IV  antibiotic Admit to medicine given his plethora of chronic medical conditions Believe patient will need progressive care unit after surgery IV fluids Avoid NSAIDs given history of gastric ulcer NPO scds  Will discuss with my partner Dr. Grandville Silos who is working this evening and will make definitive decision  I discussed the procedure in detail.  .  We discussed the risks and benefits of surgery including, but not limited to bleeding, infection (such as wound infection, abdominal abscess), injury to surrounding structures, blood clot formation, urinary retention, incisional hernia, need to convert to open, (potential need for bowel with risks of anastomotic stricture, anastomotic leak), anesthesia risks, pulmonary & cardiac complications such as pneumonia &/or heart attack, need for additional procedures, ileus, & prolonged hospitalization, worsening organ system function (lungs, kidney, heart).  We discussed the typical postoperative recovery course, including limitations & restrictions postoperatively. I explained that the likelihood of improvement in their symptoms is good.   Leighton Ruff. Redmond Pulling, MD, FACS General, Bariatric, & Minimally Invasive Surgery [facility], [place]

## 2021-01-01 NOTE — ED Provider Notes (Addendum)
[facility] EMERGENCY DEPARTMENT Provider Note   CSN: [id number] Arrival date & time: 01/01/21  1019     History Chief Complaint  Patient presents with   Abdominal Pain    Travis [name] is a 65 y.o. male.   Abdominal Pain Associated symptoms: constipation   Associated symptoms: no chest pain, no chills, no cough, no diarrhea, no dysuria, no fever, no hematuria, no nausea, no shortness of breath, no sore throat and no vomiting    65 year old male presenting to the Emergency Department with complaint of 24 hours of sharp, shooting abdominal pain.  The patient denies any nausea or vomiting.  Denies any history of abdominal surgeries.  The pain is located diffusely throughout his abdomen.  He states that he has not had a bowel movement in 3 days and has not been passing gas for at least the past 24 hours.  There is no radiation of the pain.  Pain is worse with movement, severe, and better with rest. No fevers or chills.  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 05/2019 Echo: EF 35-40%; b. 10/2019 Echo: EF 45-50%.   Allergy    Asthma    Central retinal artery occlusion of left eye 09/13/13   Chronic anemia    Clotting disorder (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 12/2010 Echo: EF 20-25%; b 08/2013 Echo: EF 45-50%; c. 01/2015 Echo: EF 25-30%; d. 07/2016 Echo: EF 35-40%; e. 05/2019 Echo: EF 35-40%; d. 10/2019 Echo: EF 45-50%, mild LVH, mild red RV fxn, mildl dil RA, Triv MR. Mild to mod Ao Sclerosis w/o stenosis.   Hyperlipidemia    Hypertension    Nonischemic cardiomyopathy (H[facility])    Nonobstructive Coronary artery disease    a. 01/2015 Cath: LM nl, LAD 30p/m, LCX nl, RCA 10p/m, RPDA min irregs.   Osteoarthritis    Pneumonia     TIA (transient ischemic attack)    Tobacco abuse    Vision loss    peripherial vision only left eye.Central Retinal  artery occusion     Patient Active Problem List   Diagnosis Date Noted   Protein-calorie malnutrition, severe 05/27/2020   Hypotension 05/26/2020   AKI (acute kidney injury) ([place]) 05/26/2020   Alcohol use 11/20/2019   CKD (chronic kidney disease), stage III ([place]) 11/20/2019   TIA (transient ischemic attack) 11/08/2019   Osteoarthritis 05/09/2016   COPD (chronic obstructive pulmonary disease) ([place]) 05/09/2016   Essential hypertension 04/18/2015   Literacy level of illiterate 06/04/2014   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   Cardiomyopathy, nonischemic ([place]) [id number]   Chronic systolic heart failure ([place]) 01/05/2011   Tobacco use 01/05/2011   Diabetes ([place]) 11/30/2008   HLD (hyperlipidemia) 11/30/2008   Gout 11/30/2008    Past Surgical History:  Procedure Laterality Date   BIOPSY  11/25/2019   Procedure: BIOPSY;  Surgeon: Ladene Artist, MD;  Location: [facility];  Service: Endoscopy;;   CARDIAC CATHETERIZATION     CARDIAC CATHETERIZATION N/A 01/28/2015   Procedure: Left Heart Cath and Coronary Angiography;  Surgeon: [place] Hampshire, MD;  Location: [facility];  Service: Cardiovascular;  Laterality:  N/A;   COLONOSCOPY     ESOPHAGOGASTRODUODENOSCOPY (EGD) WITH PROPOFOL N/A 11/25/2019   Procedure: ESOPHAGOGASTRODUODENOSCOPY (EGD) WITH PROPOFOL;  Surgeon: Ladene Artist, MD;  Location: [facility] ENDOSCOPY;  Service: Endoscopy;  Laterality: N/A;   HIP ARTHROPLASTY Right 03/15/2013   Procedure: ARTHROPLASTY BIPOLAR HIP;  Surgeon: [name], MD;  Location: [facility];  Service: Orthopedics;  Laterality: Right;   TOTAL SHOULDER ARTHROPLASTY Right 09/01/2015   Procedure: RIGHT TOTAL SHOULDER ARTHROPLASTY;  Surgeon: [name], MD;  Location: [facility];  Service: Orthopedics;  Laterality: Right;       Family History  Problem Relation Age  of Onset   Diabetes Mother    Hypertension Mother    Diabetes Father    Hypertension Father    Diabetes Sister    Dementia Brother    Cancer Neg Hx    Heart disease Neg Hx    Stroke Neg Hx    Colon cancer Neg Hx    Esophageal cancer Neg Hx    Rectal cancer Neg Hx    Stomach cancer Neg Hx     Social History   Tobacco Use   Smoking status: Every Day    Packs/day: 0.75    Years: 30.00    Pack years: 22.50    Types: Cigarettes   Smokeless tobacco: Never  Vaping Use   Vaping Use: Never used  Substance Use Topics   Alcohol use: Not Currently    Alcohol/week: 1.0 standard drink    Types: 1 Glasses of wine per week    Comment: occasionally   Drug use: Yes    Types: Marijuana    Home Medications Prior to Admission medications   Medication Sig Start Date End Date Taking? Authorizing Provider  albuterol (VENTOLIN HFA) 108 (90 Base) MCG/ACT inhaler Inhale 2 puffs into the lungs every 6 (six) hours as needed for wheezing or shortness of breath.    [provider]  allopurinol (ZYLOPRIM) 100 MG tablet Take 1 tablet (100 mg total) by mouth 2 (two) times daily. 12/19/20   [name], NP  aspirin 325 MG tablet Take 162.5 mg by mouth in the morning.    [provider]  atorvastatin (LIPITOR) 20 MG tablet Take 20 mg by mouth daily.    [provider]  carvedilol (COREG) 6.25 MG tablet Take 1 tablet (6.25 mg total) by mouth 2 (two) times daily. 06/03/20   Theora Gianotti, NP  cetirizine (ZYRTEC) 10 MG tablet Take 1 tablet (10 mg total) by mouth daily. 07/07/20   [name], NP  fenofibrate (TRICOR) 145 MG tablet TAKE 1 TABLET BY MOUTH ONCE A DAY 11/23/20   Dutch Quint B, FNP  ferrous sulfate 325 (65 FE) MG tablet Take 1 tablet (325 mg total) by mouth daily with breakfast. 06/02/20   [name], NP  fluticasone (FLONASE) 50 MCG/ACT nasal spray Place 1-2 sprays into both nostrils daily as needed for allergies or rhinitis.    [provider]  furosemide (LASIX) 40 MG tablet Take 1 tablet by mouth daily.    [provider]  hydroxypropyl methylcellulose / hypromellose (ISOPTO TEARS / GONIOVISC) 2.5 % ophthalmic solution Place 1 drop into both eyes 3 (three) times daily as needed for dry eyes.    [provider]  losartan (COZAAR) 25 MG tablet Take 0.5 tablets (12.5 mg total) by mouth daily. 12/19/20 03/19/21  [name], NP  magnesium oxide (MAG-OX) 400 MG tablet Take 1 tablet (400 mg  total) by mouth daily. 12/19/20   [name], NP  multivitamin (ONE-A-DAY MEN'S) TABS tablet Take 1 tablet by mouth daily with breakfast.    [provider]  Omega-3 Fatty Acids (FISH OIL PO) Take 1 capsule by mouth daily.    [provider]  oxymetazoline (AFRIN) 0.05 % nasal spray Place 1 spray into both nostrils 2 (two) times daily as needed for congestion (IF 3 DAYS "ON," 3 DAYS "OFF").    [provider]  pantoprazole (PROTONIX) 40 MG tablet Take 1 tablet (40 mg total) by mouth daily. 06/02/20   [name], NP  triamcinolone cream (KENALOG) 0.1 % APPLY TO AFFECTED AREAS TWICE A DAY AS NEEDED. AVOID FACE, GROIN, OR UNDERARMS. 11/01/20   Kennyth Arnold, FNP    Allergies    Patient has no known allergies.  Review of Systems   Review of Systems  Constitutional:  Negative for chills and fever.  HENT:  Negative for ear pain and sore throat.   Eyes:  Negative for pain and visual disturbance.  Respiratory:  Negative for cough and shortness of breath.   Cardiovascular:  Negative for chest pain and palpitations.  Gastrointestinal:  Positive for abdominal pain and constipation. Negative for diarrhea, nausea and vomiting.  Genitourinary:  Negative for dysuria and hematuria.  Musculoskeletal:  Negative for arthralgias and back pain.  Skin:  Negative for color change and rash.  Neurological:  Negative for seizures and syncope.  All other systems reviewed and are negative.  Physical  Exam Updated Vital Signs BP 136/83 (BP Location: Left Arm)   Pulse (!) 168   Temp 99.7 F (37.6 C) (Oral)   Resp 16   SpO2 97%   Physical Exam Vitals and nursing note reviewed.  Constitutional:      General: He is not in acute distress.    Appearance: He is well-developed.     Comments: Uncomfortable appearing  HENT:     Head: Normocephalic and atraumatic.  Eyes:     Conjunctiva/sclera: Conjunctivae normal.     Pupils: Pupils are equal, round, and reactive to light.  Cardiovascular:     Rate and Rhythm: Normal rate and regular rhythm.     Heart sounds: No murmur heard. Pulmonary:     Effort: Pulmonary effort is normal. No respiratory distress.     Breath sounds: Normal breath sounds.  Abdominal:     General: There is no distension.     Palpations: Abdomen is soft.     Tenderness: There is generalized abdominal tenderness. There is guarding. There is no rebound.  Musculoskeletal:        General: No deformity or signs of injury.     Cervical back: Normal range of motion and neck supple.  Skin:    General: Skin is warm and dry.     Findings: No lesion or rash.  Neurological:     General: No focal deficit present.     Mental Status: He is alert. Mental status is at baseline.    ED Results / Procedures / Treatments   Labs (all labs ordered are listed, but only abnormal results are displayed) Labs Reviewed  CBC WITH DIFFERENTIAL/PLATELET - Abnormal; Notable for the following components:      Result Value   HCT 52.8 (*)    MCV 100.4 (*)    All other components within normal limits  COMPREHENSIVE METABOLIC PANEL - Abnormal; Notable for the following components:   Sodium 134 (*)    Potassium  5.2 (*)    CO2 19 (*)    Glucose, Bld 279 (*)    Creatinine, Ser 1.51 (*)    Calcium 10.4 (*)    Total Protein 8.5 (*)    GFR, Estimated 51 (*)    Anion gap 17 (*)    All other components within normal limits  LACTIC ACID, PLASMA - Abnormal; Notable for the following  components:   Lactic Acid, Venous 2.3 (*)    All other components within normal limits  LIPASE, BLOOD  URINALYSIS, ROUTINE W REFLEX MICROSCOPIC  LACTIC ACID, PLASMA    EKG None  Radiology No results found.  Procedures Procedures   Medications Ordered in ED Medications - No data to display  ED Course  I have reviewed the triage vital signs and the nursing notes.  Pertinent labs & imaging results that were available during my care of the patient were reviewed by me and considered in my medical decision making (see chart for details).    MDM Rules/Calculators/A&P                            65 year old male presenting to the Emergency Department with complaint of 24 hours of sharp, shooting abdominal pain.  The patient denies any nausea or vomiting.  Denies any history of abdominal surgeries.  The pain is located diffusely throughout his abdomen.  He states that he has not had a bowel movement in 3 days and has not been passing gas for at least the past 24 hours.  There is no radiation of the pain.  No aggravating or alleviating factors.  No fevers or chills.  On arrival, the patient was afebrile T99.5, tachycardic P118 subsequently increased to sinus tachycardia with a pulse of 168, thought to be due to increasing pain after being bedded from triage, hemodynamically stable BP 134/88, saturating 96% on room air.  The patient had diffuse abdominal tenderness to palpation on physical exam with guarding present. No rebound tenderness  He has not passed gas in the last 24 hours and has not had a bowel movement in the past 3 days.  His normal bowel movements are regular daily.  Concern for possible small bowel obstruction based on patient's initial presenting symptoms versus constipation.  Additionally considered diverticulosis/diverticulitis, pyelonephritis, nephrolithiasis, appendicitis.  Will obtain screening labs and CT abdomen pelvis.  IV access was obtained and the patient was  administered IV fluids for volume resuscitation, IV Dilaudid for pain control, IV Zofran.  Given the patient's symptoms of tachycardia and tachynpnea, considered possible infection etiology such as abdominal abscess. IV Zosyn ordered.  Plan at time of signout follow-up labs and CT imaging and reassess.  Initial KUB revealed a nonobstructive bowel gas pattern, CT abdomen pending at time of signout.  Signout given to Dr. Darl Householder at [phone].   Final Clinical Impression(s) / ED Diagnoses Final diagnoses:  None    Rx / DC Orders ED Discharge Orders     None            Regan Lemming, MD 01/01/21 1940

## 2021-01-01 NOTE — [place] Notes (Signed)
Patient ID: Travis [name], male   DOB: [birth year], 65 y.o.   MRN: [id number] I discussed at length with Dr. Redmond Pulling. I have reviewed the radiology findings and examined him as well. I agree with his assessment and plan. Will proceed with laparoscopic appendectomy, possible open partial colectomy for perforated appendicitis. He received Zosyn IV. I discussed the procedure, risks, and benefits with him and his niece. They agree and his niece signed his consent as he has received narcotics. Appreciate Dr. Tally Joe care as well.  Georganna Skeans, MD, MPH, FACS Please use [email or web address] to contact on call provider

## 2021-01-01 NOTE — [place] Notes (Signed)
Received patient from OR. Placed on mechanical ventilation.

## 2021-01-01 NOTE — H&P (Signed)
History and Physical   Travis [name] [id number] DOB: [birth year] DOA: 01/01/2021  PCP: [name], NP   Patient coming from: Home  Chief Complaint: Abdominal pain  HPI: Travis [name] is a 65 y.o. male with medical history significant of alcohol use, CHF, CKD 3A, COPD, CRA O, TIA, diabetes, hypertension, gout, hyperlipidemia, CAD (nonobstructive), C who presents with ongoing right upper quadrant abdominal pain.  Patient states that since yesterday he has had pain in his right lower quadrant.  He states he had some nausea yesterday as well.  He also reports some constipation with small bowel movement yesterday.  His pain is rated as a 10 out of 10.  The pain has been gradually worsening since it began.  He also reports associated subjective fever and chills.  He denies chest pain, shortness of breath.  ED Course: Vital signs in the ED significant for temperature of T-max of 102.8, respiratory rate in the 20s to 30s, heart rate in the 120s to 140s, blood pressure stable.  Lab work-up showed CMP with potassium of 5.2, bicarb 19, gap 17, creatinine stable 1.51, glucose 279, sodium 134, calcium stable at 10.4, protein 8.5.  CBC within normal limits.  PT and INR within normal limits.  Lipase normal.  Lactic acid trend 2.3 then 3 then normal.  Respiratory panel for flu and COVID pending urinalysis showed only small hemoglobin and protein.  Blood cultures and urine cultures are pending.  Abdominal film showed nonobstructive bowel gas pattern.  CT of the abdomen pelvis showed evidence of signs that are consistent with acute appendicitis with perforation with ileal mucosal thickening unclear if the thickening is secondary to appendicitis or related to the cause of appendicitis with malignancy such as mucosal tumor not ruled out.  Small foci of free intraperitoneal air.  Patient started on Zosyn, Dilaudid, morphine as well as received a dose of Zofran in the ED.  Also received 2 L of IV fluids and started  on a rate.  Surgery was consulted and plan to take patient to the OR tonight with request for medicine to admit.  Review of Systems: As per HPI otherwise all other systems reviewed and are negative.  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 05/2019 Echo: EF 35-40%; b. 10/2019 Echo: EF 45-50%.   Allergy    Asthma    Central retinal artery occlusion of left eye 09/13/13   Chronic anemia    Clotting disorder ([place])    Diabetes mellitus, type 2 ([place])    pt reports his DM is gone   GI bleed    15 years ago   Gout    Hemoptysis    secondary to pulmonary edema   HFimpEF (heart failure with improved ejection fraction) ([place])    a. 12/2010 Echo: EF 20-25%; b 08/2013 Echo: EF 45-50%; c. 01/2015 Echo: EF 25-30%; d. 07/2016 Echo: EF 35-40%; e. 05/2019 Echo: EF 35-40%; d. 10/2019 Echo: EF 45-50%, mild LVH, mild red RV fxn, mildl dil RA, Triv MR. Mild to mod Ao Sclerosis w/o stenosis.   Hyperlipidemia    Hypertension    Nonischemic cardiomyopathy (HCC)    Nonobstructive Coronary artery disease    a. 01/2015 Cath: LM nl, LAD 30p/m, LCX nl, RCA 10p/m, RPDA min irregs.   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.Central Retinal  artery occusion     Past Surgical History:  Procedure Laterality Date   BIOPSY  11/25/2019   Procedure: BIOPSY;  Surgeon: Ladene Artist, MD;  Location: [facility];  Service: Endoscopy;;   CARDIAC CATHETERIZATION     CARDIAC CATHETERIZATION N/A 01/28/2015   Procedure: Left Heart Cath and Coronary Angiography;  Surgeon: Wellington Hampshire, MD;  Location: [facility];  Service: Cardiovascular;  Laterality: N/A;   COLONOSCOPY     ESOPHAGOGASTRODUODENOSCOPY (EGD) WITH PROPOFOL N/A 11/25/2019   Procedure: ESOPHAGOGASTRODUODENOSCOPY (EGD) WITH PROPOFOL;  Surgeon: Ladene Artist, MD;  Location: [facility] ENDOSCOPY;  Service:  Endoscopy;  Laterality: N/A;   HIP ARTHROPLASTY Right 03/15/2013   Procedure: ARTHROPLASTY BIPOLAR HIP;  Surgeon: [name], MD;  Location: [facility];  Service: Orthopedics;  Laterality: Right;   TOTAL SHOULDER ARTHROPLASTY Right 09/01/2015   Procedure: RIGHT TOTAL SHOULDER ARTHROPLASTY;  Surgeon: [name], MD;  Location: [facility];  Service: Orthopedics;  Laterality: Right;    Social History  reports that he has been smoking cigarettes. He has a 22.50 pack-year smoking history. He has never used smokeless tobacco. He reports that he does not currently use alcohol after a past usage of about 1.0 standard drink per week. He reports current drug use. Drug: Marijuana.  No Known Allergies  Family History  Problem Relation Age of Onset   Diabetes Mother    Hypertension Mother    Diabetes Father    Hypertension Father    Diabetes Sister    Dementia Brother    Cancer Neg Hx    Heart disease Neg Hx    Stroke Neg Hx    Colon cancer Neg Hx    Esophageal cancer Neg Hx    Rectal cancer Neg Hx    Stomach cancer Neg Hx   Reviewed on admission  Prior to Admission medications   Medication Sig Start Date End Date Taking? Authorizing Provider  albuterol (VENTOLIN HFA) 108 (90 Base) MCG/ACT inhaler Inhale 2 puffs into the lungs every 6 (six) hours as needed for wheezing or shortness of breath.   Yes [provider]  allopurinol (ZYLOPRIM) 100 MG tablet Take 1 tablet (100 mg total) by mouth 2 (two) times daily. 12/19/20  Yes [name], NP  aspirin 325 MG tablet Take 162.5 mg by mouth in the morning.   Yes [provider]  atorvastatin (LIPITOR) 20 MG tablet Take 20 mg by mouth daily.   Yes [provider]  carvedilol (COREG) 6.25 MG tablet Take 1 tablet (6.25 mg total) by mouth 2 (two) times daily. 06/03/20  Yes Theora Gianotti, NP  cetirizine (ZYRTEC) 10 MG tablet Take 1 tablet (10 mg total) by mouth daily. 07/07/20  Yes [name], NP  fenofibrate  (TRICOR) 145 MG tablet TAKE 1 TABLET BY MOUTH ONCE A DAY 11/23/20  Yes Dutch Quint B, FNP  ferrous sulfate 325 (65 FE) MG tablet Take 1 tablet (325 mg total) by mouth daily with breakfast. 06/02/20  Yes Baity, Coralie Keens, NP  fluticasone (FLONASE) 50 MCG/ACT nasal spray Place 1-2 sprays into both nostrils daily as needed for allergies or rhinitis.   Yes [provider]  furosemide (LASIX) 40 MG tablet Take 1 tablet by mouth daily.   Yes [provider]  hydroxypropyl methylcellulose / hypromellose (ISOPTO TEARS / GONIOVISC) 2.5 % ophthalmic solution Place 1 drop into both eyes 3 (three) times daily as needed for dry eyes.  Yes [provider]  losartan (COZAAR) 25 MG tablet Take 0.5 tablets (12.5 mg total) by mouth daily. 12/19/20 03/19/21 Yes [name], NP  magnesium oxide (MAG-OX) 400 MG tablet Take 1 tablet (400 mg total) by mouth daily. 12/19/20  Yes [name], NP  multivitamin (ONE-A-DAY MEN'S) TABS tablet Take 1 tablet by mouth daily with breakfast.   Yes [provider]  Omega-3 Fatty Acids (FISH OIL PO) Take 1 capsule by mouth daily.   Yes [provider]  pantoprazole (PROTONIX) 40 MG tablet Take 1 tablet (40 mg total) by mouth daily. 06/02/20  Yes Baity, Coralie Keens, NP  triamcinolone cream (KENALOG) 0.1 % APPLY TO AFFECTED AREAS TWICE A DAY AS NEEDED. AVOID FACE, GROIN, OR UNDERARMS. Patient taking differently: Apply 1 application topically 2 (two) times daily as needed (dry skin, rash). 11/01/20  Yes Kennyth Arnold, FNP    Physical Exam: Vitals:   01/01/21 1730 01/01/21 1750 01/01/21 1800 01/01/21 1830  BP: 134/81  121/75 119/74  Pulse: (!) 135 (!) 140 (!) 131 (!) 126  Resp: (!) 24 (!) 30 (!) 36 (!) 32  Temp:  (!) 102.8 F (39.3 C)    TempSrc:  Oral    SpO2: 94% 92% 93% 91%   Physical Exam Constitutional:      General: He is not in acute distress.    Appearance: Normal appearance.  HENT:     Head: Normocephalic and atraumatic.      Mouth/Throat:     Mouth: Mucous membranes are moist.     Pharynx: Oropharynx is clear.  Eyes:     Extraocular Movements: Extraocular movements intact.     Pupils: Pupils are equal, round, and reactive to light.  Cardiovascular:     Rate and Rhythm: Regular rhythm. Tachycardia present.     Pulses: Normal pulses.     Heart sounds: Normal heart sounds.  Pulmonary:     Effort: Pulmonary effort is normal. No respiratory distress.     Breath sounds: Normal breath sounds.     Comments: Intermittent tachypnea Abdominal:     General: Bowel sounds are normal. There is no distension.     Palpations: Abdomen is soft.     Tenderness: There is abdominal tenderness in the right lower quadrant. There is guarding.  Musculoskeletal:        General: No swelling or deformity.  Skin:    General: Skin is warm and dry.  Neurological:     General: No focal deficit present.     Mental Status: Mental status is at baseline.   Labs on Admission: I have personally reviewed following labs and imaging studies  CBC: Recent Labs  Lab 01/01/21 1023  WBC 9.1  NEUTROABS 7.2  HGB 16.7  HCT 52.8*  MCV 100.4*  PLT [id number]    Basic Metabolic Panel: Recent Labs  Lab 01/01/21 1023  NA 134*  K 5.2*  CL 98  CO2 19*  GLUCOSE 279*  BUN 20  CREATININE 1.51*  CALCIUM 10.4*    GFR: CrCl cannot be calculated (Unknown ideal weight.).  Liver Function Tests: Recent Labs  Lab 01/01/21 1023  AST 24  ALT 19  ALKPHOS 103  BILITOT 1.2  PROT 8.5*  ALBUMIN 4.5    Urine analysis:    Component Value Date/Time   COLORURINE AMBER (A) 01/01/2021 1338   APPEARANCEUR HAZY (A) 01/01/2021 1338   APPEARANCEUR Cloudy 11/19/2012 1107   LABSPEC >1.046 (H) 01/01/2021 1338   LABSPEC 1.024 11/19/2012  1107   PHURINE 5.0 01/01/2021 1338   GLUCOSEU 50 (A) 01/01/2021 1338   GLUCOSEU Negative 11/19/2012 1107   HGBUR SMALL (A) 01/01/2021 1338   BILIRUBINUR NEGATIVE 01/01/2021 1338   BILIRUBINUR Negative 11/19/2012  1107   [place] 01/01/2021 1338   PROTEINUR 30 (A) 01/01/2021 1338   UROBILINOGEN 0.2 09/14/2013 1225   NITRITE NEGATIVE 01/01/2021 1338   LEUKOCYTESUR NEGATIVE 01/01/2021 1338   LEUKOCYTESUR Negative 11/19/2012 1107    Radiological Exams on Admission: CT Abdomen Pelvis W Contrast  Addendum Date: 01/01/2021   ADDENDUM REPORT: 01/01/2021 16:38 ADDENDUM: These results were called by telephone at the time of interpretation on 01/01/2021 at 4:35 pm to provider Dr. Darl Householder , Who verbally acknowledged these results. Electronically Signed   By: Fidela Salisbury M.D.   On: 01/01/2021 16:38   Result Date: 01/01/2021 CLINICAL DATA:  Abdominal pain. EXAM: CT ABDOMEN AND PELVIS WITH CONTRAST TECHNIQUE: Multidetector CT imaging of the abdomen and pelvis was performed using the standard protocol following bolus administration of intravenous contrast. CONTRAST:  169m OMNIPAQUE IOHEXOL 300 MG/ML  SOLN COMPARISON:  None. FINDINGS: Lower chest: No acute abnormality. Hepatobiliary: No focal liver abnormality is seen. No gallstones, gallbladder wall thickening, or biliary dilatation. Pancreas: Unremarkable. No pancreatic ductal dilatation or surrounding inflammatory changes. Spleen: Normal in size without focal abnormality. Adrenals/Urinary Tract: Adrenal glands are unremarkable. Kidneys are normal, without renal calculi, focal lesion, or hydronephrosis. Bladder is unremarkable. Stomach/Bowel: The stomach and small bowel are normal. There is circumferential mucosal thickening at the base of the appendix and the cecum at the ileocecal junction. There is also indistinctness of the bowel wall of the proximal appendix. The distal appendix is decompressed, contains gas and fecal matter and overall has normal appearance. There is small amount of free gas and free fluid at the base of appendix and surrounding the cecum. The free gas appears to originate from the base of the appendix. Inflammatory fat stranding surrounds  the base of the appendix and distal cecum. Vascular/Lymphatic: Aortic atherosclerosis. No enlarged abdominal or pelvic lymph nodes. Reproductive: Prostate is unremarkable. Other: No abdominal wall hernia or abnormality. Small amount of free fluid in the right lower quadrant of the abdomen along the pericolic gutter. Additional small foci of gas scattered in the mesentery of the right lower and upper quadrants, and in perihepatic location. Musculoskeletal: Advanced spondylosis of lower lumbosacral spine. IMPRESSION: 1. Circumferential mucosal thickening and indistinctness of the bowel wall at the base of the appendix and the distal cecum at the ileocecal junction. There is an associated milder mucosal thickening and indistinctness of the terminal ileum. The distal appendix is decompressed, contains gas and fecal matter and overall has normal appearance. Small amount of free fluid and free gas in the right lower quadrant of the abdomen. These findings are likely due to acute appendicitis with perforation. It is unclear however whether the mucosal thickening of the cecum is reactive or is the source of appendiceal obstruction. Underlying malignancy such as mucinous tumor is in the differential diagnosis. 2. Small foci of free intraperitoneal gas and small amount of fluid scattered in the mesentery of the right lower and upper quadrants of the abdomen, and in perihepatic location. Aortic Atherosclerosis (ICD10-I70.0). Electronically Signed: By: DFidela SalisburyM.D. On: 01/01/2021 16:28   DG Abd Portable 1V  Result Date: 01/01/2021 CLINICAL DATA:  Abdominal pain. EXAM: PORTABLE ABDOMEN - 1 VIEW COMPARISON:  September 06, 2015 FINDINGS: The bowel gas pattern is normal. No radio-opaque calculi or other  significant radiographic abnormality are seen. Osteoarthritic changes of the lumbosacral spine. Partially visualized right hip prosthesis. IMPRESSION: Nonobstructive bowel gas pattern. Electronically Signed   By: Fidela Salisbury M.D.   On: 01/01/2021 15:02    EKG: Independently reviewed.  Sinus tachycardia 118 bpm.  Evidence of LVH.  Some nonspecific T wave changes.  Assessment/Plan Principal Problem:   Ruptured appendicitis Active Problems:   Diabetes ([place])   HLD (hyperlipidemia)   Gout   Chronic systolic heart failure (HCC)   CRA (central retinal artery occlusion)   Essential hypertension   COPD (chronic obstructive pulmonary disease) (HCC)   CKD (chronic kidney disease), stage III (HCC)   Sepsis (HCC)  Ruptured appendicitis  Sepsis > Patient meets sepsis criteria with tachycardia, fever one 2.8, respiratory rate in the 20s to 30s with source of his appendicitis.  Currently white count is normal however. > CT as per ED course demonstrated evidence of ruptured appendicitis with mucosal thickening in the ileum.  Also foci of free peritoneal air. > Per EDP, surgery consulted in the ED and plan to take patient to the OR tonight. > Patient did receive pain medication and Zosyn in the ED he also received 2 L of fluid and started on a rate. - Admit patient to progressive unit following surgery - Appreciate surgery recommendations and assistance with this patient - Continue Zosyn - Continue pain control as needed - Slow rate of IV fluids given patient's history of CHF and monitor for volume overload - Trend fever curve and white count  CHF Hypertension > Last echo August 2021 with EF 45-50% with indeterminate diastolic function and mildly reduced RV function. > Did receive 2 L of IV fluid for his sepsis and tachycardia in the ED.  We will monitor for evidence of volume overload.  On exam currently no lower extremity edema nor rales to indicate volume overload. - Plan to continue home medications including Lasix, carvedilol, losartan after surgery when tolerating p.o.  CAD Hyperlipidemia > Nonobstructive CAD noted on cath in 2016 - Plan to continue home medications of carvedilol, losartan when  able to tolerate p.o. - Continue home statin when able to tolerate p.o. - Holding aspirin for now  CKD 3a Lactic acidosis > Creatinine stable at 1.51 > Potassium mildly elevated 5.2 > Bicarb 19 anion gap 17 with lactic acid initially in the 2 to 3 range but now cleared. - Avoid nephrotoxic agents - Trend renal function and electrolytes - Check magnesium  Diabetes - SSI-sensitive  COPD - Continue as needed albuterol  Gout - Continue home allopurinol and tolerating p.o.  History of CRAO and TIA - Holding home aspirin for now - Plan to continue statin when tolerating p.o.  DVT prophylaxis: SCDs  Code Status:   Full  Family Communication:  Niece updated at bedside, states she is in contact with his other family Disposition Plan:   Patient is from:  Home  Anticipated DC to:  Home  Anticipated DC date:  3 to 7 days  Anticipated DC barriers: None  Consults called:  General surgery consulted in ED, plan takes patient for surgery tonight. Admission status:  Inpatient, progressive  Severity of Illness: The appropriate patient status for this patient is INPATIENT. Inpatient status is judged to be reasonable and necessary in order to provide the required intensity of service to ensure the patient's safety. The patient's presenting symptoms, physical exam findings, and initial radiographic and laboratory data in the context of their chronic comorbidities is felt to  place them at high risk for further clinical deterioration. Furthermore, it is not anticipated that the patient will be medically stable for discharge from the hospital within 2 midnights of admission. The following factors support the patient status of inpatient.   " The patient's presenting symptoms include abdominal pain. " The worrisome physical exam findings include abdominal pain. " The initial radiographic and laboratory data are worrisome because of  Lab work-up showed CMP with potassium of 5.2, bicarb 19, gap 17,  creatinine stable 1.51, glucose 279, sodium 134, calcium stable at 10.4, protein 8.5.  CBC within normal limits.  PT and INR within normal limits.  Lipase normal.  Lactic acid trend 2.3 then 3 then normal.  Respiratory panel for flu and COVID pending urinalysis showed only small hemoglobin and protein.  Blood cultures and urine cultures are pending.  Abdominal film showed nonobstructive bowel gas pattern.  CT of the abdomen pelvis showed evidence of signs that are consistent with acute appendicitis with perforation with ileal mucosal thickening unclear if the thickening is secondary to appendicitis or related to the cause of appendicitis with malignancy such as mucosal tumor not ruled out.  Small foci of free intraperitoneal air. " The chronic co-morbidities include CHF, COPD, diabetes, CKD 3A, hypertension, hyperlipidemia, history of TIA.   * I certify that at the point of admission it is my clinical judgment that the patient will require inpatient hospital care spanning beyond 2 midnights from the point of admission due to high intensity of service, high risk for further deterioration and high frequency of surveillance required.Marcelyn Bruins MD [facility]  How to contact the [facility] Attending or Consulting provider [place] or covering provider during after hours [place], for this patient?   Check the care team in [facility] and look for a) attending/consulting [facility] provider listed and b) the [facility] team listed Log into [email or web address] and use [place]'s universal password to access. If you do not have the password, please contact the hospital operator. Locate the [facility] provider you are looking for under [facility] and page to a number that you can be directly reached. If you still have difficulty reaching the provider, please page the [facility] (Director on Call) for the Hospitalists listed on amion for assistance.  01/01/2021, 7:05 PM

## 2021-01-01 NOTE — Anesthesia Procedure Notes (Signed)
Arterial Line Insertion Start/End10/11/2020 8:53 PM, 01/01/2021 8:56 PM Performed by: Effie Berkshire, MD, anesthesiologist  Patient location: Pre-op. Preanesthetic checklist: patient identified, IV checked, site marked, risks and benefits discussed, surgical consent, monitors and equipment checked, pre-op evaluation, timeout performed and anesthesia consent Lidocaine 1% used for infiltration Left, radial was placed Catheter size: 20 G Hand hygiene performed  and maximum sterile barriers used   Attempts: 1 Procedure performed without using ultrasound guided technique. Following insertion, dressing applied and Biopatch. Post procedure assessment: normal and unchanged  Patient tolerated the procedure well with no immediate complications.

## 2021-01-01 NOTE — ED Notes (Signed)
Lactic Acid 2.3.  Travis [name], [place] at triage notified.

## 2021-01-02 ENCOUNTER — Encounter (HOSPITAL_COMMUNITY): Payer: Self-pay | Admitting: General Surgery

## 2021-01-02 DIAGNOSIS — R52 Pain, unspecified: Secondary | ICD-10-CM | POA: Diagnosis not present

## 2021-01-02 DIAGNOSIS — J90 Pleural effusion, not elsewhere classified: Secondary | ICD-10-CM | POA: Diagnosis not present

## 2021-01-02 DIAGNOSIS — J9690 Respiratory failure, unspecified, unspecified whether with hypoxia or hypercapnia: Secondary | ICD-10-CM | POA: Diagnosis not present

## 2021-01-02 LAB — CBC
HCT: 36.2 % — ABNORMAL LOW (ref 39.0–52.0)
Hemoglobin: 11.8 g/dL — ABNORMAL LOW (ref 13.0–17.0)
MCH: 31.4 pg (ref 26.0–34.0)
MCHC: 32.6 g/dL (ref 30.0–36.0)
MCV: 96.3 fL (ref 80.0–100.0)
Platelets: 200 10*3/uL (ref 150–400)
RBC: 3.76 MIL/uL — ABNORMAL LOW (ref 4.22–5.81)
RDW: 15 % (ref 11.5–15.5)
WBC: 7 10*3/uL (ref 4.0–10.5)
nRBC: 0 % (ref 0.0–0.2)

## 2021-01-02 LAB — POCT I-STAT 7, (LYTES, BLD GAS, ICA,H+H)
Acid-base deficit: 1 mmol/L (ref 0.0–2.0)
Acid-base deficit: 2 mmol/L (ref 0.0–2.0)
Acid-base deficit: 3 mmol/L — ABNORMAL HIGH (ref 0.0–2.0)
Bicarbonate: 21.6 mmol/L (ref 20.0–28.0)
Bicarbonate: 23.2 mmol/L (ref 20.0–28.0)
Bicarbonate: 23.9 mmol/L (ref 20.0–28.0)
Calcium, Ion: 1.15 mmol/L (ref 1.15–1.40)
Calcium, Ion: 1.18 mmol/L (ref 1.15–1.40)
Calcium, Ion: 1.21 mmol/L (ref 1.15–1.40)
HCT: 34 % — ABNORMAL LOW (ref 39.0–52.0)
HCT: 35 % — ABNORMAL LOW (ref 39.0–52.0)
HCT: 36 % — ABNORMAL LOW (ref 39.0–52.0)
Hemoglobin: 11.6 g/dL — ABNORMAL LOW (ref 13.0–17.0)
Hemoglobin: 11.9 g/dL — ABNORMAL LOW (ref 13.0–17.0)
Hemoglobin: 12.2 g/dL — ABNORMAL LOW (ref 13.0–17.0)
O2 Saturation: 100 %
O2 Saturation: 95 %
O2 Saturation: 96 %
Patient temperature: 100.9
Patient temperature: 39.1
Patient temperature: 98.6
Potassium: 4.6 mmol/L (ref 3.5–5.1)
Potassium: 5 mmol/L (ref 3.5–5.1)
Potassium: 5.3 mmol/L — ABNORMAL HIGH (ref 3.5–5.1)
Sodium: 134 mmol/L — ABNORMAL LOW (ref 135–145)
Sodium: 135 mmol/L (ref 135–145)
Sodium: 135 mmol/L (ref 135–145)
TCO2: 23 mmol/L (ref 22–32)
TCO2: 25 mmol/L (ref 22–32)
TCO2: 25 mmol/L (ref 22–32)
pCO2 arterial: 34.2 mmHg (ref 32.0–48.0)
pCO2 arterial: 42.4 mmHg (ref 32.0–48.0)
pCO2 arterial: 47.3 mmHg (ref 32.0–48.0)
pH, Arterial: 7.309 — ABNORMAL LOW (ref 7.350–7.450)
pH, Arterial: 7.364 (ref 7.350–7.450)
pH, Arterial: 7.409 (ref 7.350–7.450)
pO2, Arterial: 205 mmHg — ABNORMAL HIGH (ref 83.0–108.0)
pO2, Arterial: 73 mmHg — ABNORMAL LOW (ref 83.0–108.0)
pO2, Arterial: 97 mmHg (ref 83.0–108.0)

## 2021-01-02 LAB — COMPREHENSIVE METABOLIC PANEL
ALT: 15 U/L (ref 0–44)
AST: 19 U/L (ref 15–41)
Albumin: 2.7 g/dL — ABNORMAL LOW (ref 3.5–5.0)
Alkaline Phosphatase: 52 U/L (ref 38–126)
Anion gap: 11 (ref 5–15)
BUN: 23 mg/dL (ref 8–23)
CO2: 19 mmol/L — ABNORMAL LOW (ref 22–32)
Calcium: 8.3 mg/dL — ABNORMAL LOW (ref 8.9–10.3)
Chloride: 101 mmol/L (ref 98–111)
Creatinine, Ser: 2.01 mg/dL — ABNORMAL HIGH (ref 0.61–1.24)
GFR, Estimated: 36 mL/min — ABNORMAL LOW (ref >60)
Glucose, Bld: 266 mg/dL — ABNORMAL HIGH (ref 70–99)
Potassium: 4.7 mmol/L (ref 3.5–5.1)
Sodium: 131 mmol/L — ABNORMAL LOW (ref 135–145)
Total Bilirubin: 2.5 mg/dL — ABNORMAL HIGH (ref 0.3–1.2)
Total Protein: 5.7 g/dL — ABNORMAL LOW (ref 6.5–8.1)

## 2021-01-02 LAB — TRIGLYCERIDES: Triglycerides: 138 mg/dL (ref <150)

## 2021-01-02 LAB — GLUCOSE, CAPILLARY
Glucose-Capillary: 133 mg/dL — ABNORMAL HIGH (ref 70–99)
Glucose-Capillary: 137 mg/dL — ABNORMAL HIGH (ref 70–99)
Glucose-Capillary: 169 mg/dL — ABNORMAL HIGH (ref 70–99)
Glucose-Capillary: 210 mg/dL — ABNORMAL HIGH (ref 70–99)
Glucose-Capillary: 226 mg/dL — ABNORMAL HIGH (ref 70–99)
Glucose-Capillary: 229 mg/dL — ABNORMAL HIGH (ref 70–99)

## 2021-01-02 LAB — PHOSPHORUS: Phosphorus: 3.7 mg/dL (ref 2.5–4.6)

## 2021-01-02 LAB — MAGNESIUM: Magnesium: 1.6 mg/dL — ABNORMAL LOW (ref 1.7–2.4)

## 2021-01-02 LAB — MRSA NEXT GEN BY PCR, NASAL: MRSA by PCR Next Gen: NOT DETECTED

## 2021-01-02 MED ORDER — CHLORHEXIDINE GLUCONATE 0.12 % MT SOLN
15.0000 mL | Freq: Two times a day (BID) | OROMUCOSAL | Status: DC
  Administered 2021-01-03 – 2021-01-05 (×6): 15 mL via OROMUCOSAL
  Filled 2021-01-02 (×2): qty 15

## 2021-01-02 MED ORDER — PANTOPRAZOLE SODIUM 40 MG IV SOLR
40.0000 mg | Freq: Every day | INTRAVENOUS | Status: DC
  Administered 2021-01-02 – 2021-01-10 (×9): 40 mg via INTRAVENOUS
  Filled 2021-01-02 (×9): qty 40

## 2021-01-02 MED ORDER — MAGNESIUM SULFATE 2 GM/50ML IV SOLN
2.0000 g | Freq: Once | INTRAVENOUS | Status: AC
  Administered 2021-01-02: 2 g via INTRAVENOUS
  Filled 2021-01-02: qty 50

## 2021-01-02 MED ORDER — PHENYLEPHRINE HCL-NACL 20-0.9 MG/250ML-% IV SOLN
0.0000 ug/min | INTRAVENOUS | Status: DC
  Administered 2021-01-02: 20 ug/min via INTRAVENOUS

## 2021-01-02 MED ORDER — PROPOFOL 1000 MG/100ML IV EMUL
0.0000 ug/kg/min | INTRAVENOUS | Status: DC
  Administered 2021-01-02: 35 ug/kg/min via INTRAVENOUS
  Filled 2021-01-02: qty 100

## 2021-01-02 MED ORDER — MORPHINE SULFATE (PF) 2 MG/ML IV SOLN
2.0000 mg | INTRAVENOUS | Status: DC | PRN
  Administered 2021-01-02 – 2021-01-03 (×7): 2 mg via INTRAVENOUS
  Filled 2021-01-02 (×7): qty 1

## 2021-01-02 MED ORDER — INSULIN ASPART 100 UNIT/ML IJ SOLN
0.0000 [IU] | INTRAMUSCULAR | Status: DC
  Administered 2021-01-02: 3 [IU] via SUBCUTANEOUS
  Administered 2021-01-02: 5 [IU] via SUBCUTANEOUS
  Administered 2021-01-02 – 2021-01-05 (×9): 2 [IU] via SUBCUTANEOUS
  Administered 2021-01-06 (×2): 3 [IU] via SUBCUTANEOUS
  Administered 2021-01-06 (×2): 2 [IU] via SUBCUTANEOUS
  Administered 2021-01-06 (×2): 3 [IU] via SUBCUTANEOUS
  Administered 2021-01-06 – 2021-01-08 (×8): 2 [IU] via SUBCUTANEOUS
  Administered 2021-01-08: 3 [IU] via SUBCUTANEOUS
  Administered 2021-01-08 – 2021-01-09 (×5): 2 [IU] via SUBCUTANEOUS
  Administered 2021-01-09: 3 [IU] via SUBCUTANEOUS

## 2021-01-02 MED ORDER — ORAL CARE MOUTH RINSE
15.0000 mL | Freq: Two times a day (BID) | OROMUCOSAL | Status: DC
  Administered 2021-01-03 – 2021-01-04 (×4): 15 mL via OROMUCOSAL

## 2021-01-02 MED ORDER — THIAMINE HCL 100 MG/ML IJ SOLN
100.0000 mg | Freq: Every day | INTRAMUSCULAR | Status: DC
  Administered 2021-01-02 – 2021-01-07 (×6): 100 mg via INTRAVENOUS
  Filled 2021-01-02 (×6): qty 2

## 2021-01-02 MED ORDER — IPRATROPIUM-ALBUTEROL 0.5-2.5 (3) MG/3ML IN SOLN
3.0000 mL | Freq: Four times a day (QID) | RESPIRATORY_TRACT | Status: DC
  Administered 2021-01-02 – 2021-01-04 (×12): 3 mL via RESPIRATORY_TRACT
  Filled 2021-01-02 (×11): qty 3

## 2021-01-02 NOTE — Progress Notes (Signed)
Progress Note  1 Day Post-Op  Subjective: On the vent but alert. Abdomen seems tender.   Objective: Vital signs in last 24 hours: Temp:  [98.6 F (37 C)-102.8 F (39.3 C)] 99.2 F (37.3 C) (10/10 0733) Pulse Rate:  [86-168] 103 (10/10 0900) Resp:  [0-50] 0 (10/10 0900) BP: (96-148)/(54-90) 96/70 (10/10 0900) SpO2:  [91 %-100 %] 94 % (10/10 0900) Arterial Line BP: (95-167)/(51-84) 110/54 (10/10 0900) FiO2 (%):  [40 %-100 %] 50 % (10/10 0900) Weight:  [86.8 kg] 86.8 kg (10/10 0500) Last BM Date:  (PTA)  Intake/Output from previous day: 10/09 0701 - 10/10 0700 In: 5144.6 [I.V.:2727.3; IV Piggyback:2417.3] Out: 795 [Urine:460; Drains:135; Blood:200] Intake/Output this shift: Total I/O In: 222.6 [I.V.:197.7; IV Piggyback:24.9] Out: 145 [Urine:130; Drains:15]  PE: General: WD, elderly appearing male who is laying in bed on the vent Heart: sinus tachycardia in the low 100s.  Normal s1,s2. No obvious murmurs, gallops, or rubs noted.  Palpable radial and pedal pulses bilaterally Lungs: vent sounds bilaterally  Abd: soft, appropriately ttp, moderately distended, BS hypoactive, drain with SS fluid, midline wound clean, NGT with bilious drainage MS: all 4 extremities are symmetrical with no cyanosis, clubbing, or edema. Skin: warm and dry with no masses, lesions, or rashes    Lab Results:  Recent Labs    01/01/21 1023 01/01/21 2218 01/02/21 0321 01/02/21 0428  WBC 9.1  --  7.0  --   HGB 16.7   < > 11.8* 12.2*  HCT 52.8*   < > 36.2* 36.0*  PLT 209  --  200  --    < > = values in this interval not displayed.   BMET Recent Labs    01/01/21 1023 01/01/21 2218 01/02/21 0321 01/02/21 0428  NA 134*   < > 131* 134*  K 5.2*   < > 4.7 4.6  CL 98  --  101  --   CO2 19*  --  19*  --   GLUCOSE 279*  --  266*  --   BUN 20  --  23  --   CREATININE 1.51*  --  2.01*  --   CALCIUM 10.4*  --  8.3*  --    < > = values in this interval not displayed.   PT/INR Recent Labs     01/01/21 1340  LABPROT 14.4  INR 1.1   CMP     Component Value Date/Time   NA 134 (L) 01/02/2021 0428   NA 138 06/20/2020 1034   NA 141 04/20/2014 1019   K 4.6 01/02/2021 0428   K 3.7 04/20/2014 1019   CL 101 01/02/2021 0321   CL 106 04/20/2014 1019   CO2 19 (L) 01/02/2021 0321   CO2 30 04/20/2014 1019   GLUCOSE 266 (H) 01/02/2021 0321   GLUCOSE 102 (H) 04/20/2014 1019   BUN 23 01/02/2021 0321   BUN 18 06/20/2020 1034   BUN 14 04/20/2014 1019   CREATININE 2.01 (H) 01/02/2021 0321   CREATININE 1.18 04/20/2014 1019   CALCIUM 8.3 (L) 01/02/2021 0321   CALCIUM 9.3 04/20/2014 1019   PROT 5.7 (L) 01/02/2021 0321   PROT 6.5 06/18/2017 1425   PROT 7.4 04/20/2014 1019   ALBUMIN 2.7 (L) 01/02/2021 0321   ALBUMIN 4.0 06/18/2017 1425   ALBUMIN 3.4 04/20/2014 1019   AST 19 01/02/2021 0321   AST 21 04/20/2014 1019   ALT 15 01/02/2021 0321   ALT 20 04/20/2014 1019   ALKPHOS 52 01/02/2021 0321  ALKPHOS 97 04/20/2014 1019   BILITOT 2.5 (H) 01/02/2021 0321   BILITOT 0.4 06/18/2017 1425   BILITOT 0.6 04/20/2014 1019   GFRNONAA 36 (L) 01/02/2021 0321   GFRNONAA >60 04/20/2014 1019   GFRNONAA >60 11/19/2012 1107   GFRAA 53 (L) 01/13/2020 1410   GFRAA >60 04/20/2014 1019   GFRAA >60 11/19/2012 1107   Lipase     Component Value Date/Time   LIPASE 31 01/01/2021 1023   LIPASE 112 04/20/2014 1019       Studies/Results: CT Abdomen Pelvis W Contrast  Addendum Date: 01/01/2021   ADDENDUM REPORT: 01/01/2021 16:38 ADDENDUM: These results were called by telephone at the time of interpretation on 01/01/2021 at 4:35 pm to provider Dr. Darl Householder , Who verbally acknowledged these results. Electronically Signed   By: Fidela Salisbury M.D.   On: 01/01/2021 16:38   Result Date: 01/01/2021 CLINICAL DATA:  Abdominal pain. EXAM: CT ABDOMEN AND PELVIS WITH CONTRAST TECHNIQUE: Multidetector CT imaging of the abdomen and pelvis was performed using the standard protocol following bolus administration  of intravenous contrast. CONTRAST:  131m OMNIPAQUE IOHEXOL 300 MG/ML  SOLN COMPARISON:  None. FINDINGS: Lower chest: No acute abnormality. Hepatobiliary: No focal liver abnormality is seen. No gallstones, gallbladder wall thickening, or biliary dilatation. Pancreas: Unremarkable. No pancreatic ductal dilatation or surrounding inflammatory changes. Spleen: Normal in size without focal abnormality. Adrenals/Urinary Tract: Adrenal glands are unremarkable. Kidneys are normal, without renal calculi, focal lesion, or hydronephrosis. Bladder is unremarkable. Stomach/Bowel: The stomach and small bowel are normal. There is circumferential mucosal thickening at the base of the appendix and the cecum at the ileocecal junction. There is also indistinctness of the bowel wall of the proximal appendix. The distal appendix is decompressed, contains gas and fecal matter and overall has normal appearance. There is small amount of free gas and free fluid at the base of appendix and surrounding the cecum. The free gas appears to originate from the base of the appendix. Inflammatory fat stranding surrounds the base of the appendix and distal cecum. Vascular/Lymphatic: Aortic atherosclerosis. No enlarged abdominal or pelvic lymph nodes. Reproductive: Prostate is unremarkable. Other: No abdominal wall hernia or abnormality. Small amount of free fluid in the right lower quadrant of the abdomen along the pericolic gutter. Additional small foci of gas scattered in the mesentery of the right lower and upper quadrants, and in perihepatic location. Musculoskeletal: Advanced spondylosis of lower lumbosacral spine. IMPRESSION: 1. Circumferential mucosal thickening and indistinctness of the bowel wall at the base of the appendix and the distal cecum at the ileocecal junction. There is an associated milder mucosal thickening and indistinctness of the terminal ileum. The distal appendix is decompressed, contains gas and fecal matter and overall has  normal appearance. Small amount of free fluid and free gas in the right lower quadrant of the abdomen. These findings are likely due to acute appendicitis with perforation. It is unclear however whether the mucosal thickening of the cecum is reactive or is the source of appendiceal obstruction. Underlying malignancy such as mucinous tumor is in the differential diagnosis. 2. Small foci of free intraperitoneal gas and small amount of fluid scattered in the mesentery of the right lower and upper quadrants of the abdomen, and in perihepatic location. Aortic Atherosclerosis (ICD10-I70.0). Electronically Signed: By: DFidela SalisburyM.D. On: 01/01/2021 16:28   Portable Chest x-ray  Result Date: 01/02/2021 CLINICAL DATA:  Respiratory failure EXAM: PORTABLE CHEST 1 VIEW COMPARISON:  05/26/2020 FINDINGS: Endotracheal tube seen 5.8 cm above the carina.  Nasogastric tube extends into the upper abdomen just beyond the expected gastroesophageal junction. Lung volumes are small and there is mild elevation of the right hemidiaphragm. Small left pleural effusion is present. No superimposed focal pulmonary infiltrate. No pneumothorax. Cardiac size within normal limits. Pulmonary vascularity is normal. No acute bone abnormality. IMPRESSION: Endotracheal tube in appropriate position. Nasogastric tube may be advanced 5-10 cm to ensure appropriate position within the gastric lumen. Pulmonary hypoinflation. Small left pleural effusion. Electronically Signed   By: Fidela Salisbury M.D.   On: 01/02/2021 00:15   DG Abd Portable 1V  Result Date: 01/01/2021 CLINICAL DATA:  Abdominal pain. EXAM: PORTABLE ABDOMEN - 1 VIEW COMPARISON:  September 06, 2015 FINDINGS: The bowel gas pattern is normal. No radio-opaque calculi or other significant radiographic abnormality are seen. Osteoarthritic changes of the lumbosacral spine. Partially visualized right hip prosthesis. IMPRESSION: Nonobstructive bowel gas pattern. Electronically Signed   By:  Fidela Salisbury M.D.   On: 01/01/2021 15:02    Anti-infectives: Anti-infectives (From admission, onward)    Start     Dose/Rate Route Frequency Ordered Stop   01/01/21 2200  piperacillin-tazobactam (ZOSYN) IVPB 3.375 g        3.375 g 12.5 mL/hr over 240 Minutes Intravenous Every 8 hours 01/01/21 1841 01/04/21 2359   01/01/21 2100  piperacillin-tazobactam (ZOSYN) IVPB 3.375 g  Status:  Discontinued        3.375 g 100 mL/hr over 30 Minutes Intravenous Every 8 hours 01/01/21 1836 01/01/21 1841   01/01/21 1345  piperacillin-tazobactam (ZOSYN) IVPB 3.375 g        3.375 g 100 mL/hr over 30 Minutes Intravenous  Once 01/01/21 1337 01/01/21 1430        Assessment/Plan Perforated appendicitis with abscess POD1 s/p exploratory laparotomy, ileocecectomy, drainage of intraabdominal abscess 01/01/21 Dr. Grandville Silos - NGT with bilious drainage, expect post-op ileus  - WBC 7.0 this AM, continue to monitor, cxs pending, continue IV Zosyn  - drain SS this AM, continue drain - BID wet to dry dressing to midline - mobilize as able once extubated - if not able to start PO intake in the next 1-2 days will need to consider PICC and TPN  FEN: NPO, IVF, NGT to LIWS VTE: ok to start LMWH or SQH today  ID: Zosyn 10/9>>  EtOH abuse Alcohol related cardiomyopathy T2DM Chronic anemia Hx of TIA HTN HLD Gout Hx of GIB CAD Tobacco abuse   LOS: 1 day    Norm Parcel, [facility] Surgery 01/02/2021, 10:12 AM Please see Amion for pager number during day hours 7:00am-4:30pm

## 2021-01-02 NOTE — Consult Note (Signed)
NAME:  Travis [name], MRN:  [id number], DOB:  [birth year], LOS: 1 ADMISSION DATE:  01/01/2021, CONSULTATION DATE:  01/01/2021 REFERRING MD:  Dr Georganna Skeans, CHIEF COMPLAINT:  abdominal pain   History of Present Illness:  Travis [name]. Travis [name] is a 65 year old gentleman with an extended PMH that includes but not limited to CHF, CKD 3A, COPD, CRA O, TIA, diabetes, hypertension, gout, hyperlipidemia, CAD (nonobstructive). She presented to the ED on 01/01/21 with complaints of right lower quadrant abdominal pain. He was found to be pyrexial and tachycardiac. Work up included a CT scan of the abdomen and pelvis which revealed:  "Circumferential mucosal thickening and indistinctness of the bowel wall at the base of the appendix and the distal cecum at the ileocecal junction. There is an associated milder mucosal thickening and indistinctness of the terminal ileum. The distal appendix is decompressed, contains gas and fecal matter and overall has normal appearance. Small amount of free fluid and free gas in the right lower quadrant of the abdomen. These findings are likely due to acute appendicitis with perforation. It is unclear however whether the mucosal thickening of the cecum is reactive or is the source of appendiceal obstruction. Underlying malignancy such as mucinous tumor is in the differential diagnosis. 2. Small foci of free intraperitoneal gas and small amount of fluid scattered in the mesentery of the right lower and upper quadrants of the abdomen, and in perihepatic location."  Patient was therefore taken to the operating room where he was found to have a perforated appendix and abdominal abscess. He is S/P DIAGNOSTIC LAPAROSCOPY; EXPLORATORY LAPAROTOMY, ILECECECTOMY, DRAINAGE OF INTRA-ABDOMINAL ABSCESS  The patient is now in the ICU and on mechanical ventilation.  He arrived on Neosynephrine drip for vasopressor support and on IVF.  I have seen and examined the patient.  Pertinent   Medical History  CKD 3A COPD TIA DM  Significant Hospital Events: Including procedures, antibiotic start and stop dates in addition to other pertinent events     Interim History / Subjective:  Patient sedated on vent support  Objective   Blood pressure 124/84, pulse (!) 101, temperature (!) 101.9 F (38.8 C), temperature source Oral, resp. rate 18, height 6' (1.829 m), weight 86.8 kg, SpO2 100 %.    Vent Mode: PRVC FiO2 (%):  [40 %-100 %] 40 % Set Rate:  [18 bmp] 18 bmp Vt Set:  [id number] mL] 620 mL PEEP:  [5 cmH20] 5 cmH20 Plateau Pressure:  [22 cmH20] 22 cmH20   Intake/Output Summary (Last 24 hours) at 01/02/2021 0123 Last data filed at 01/02/2021 0000 Gross per 24 hour  Intake 4173.94 ml  Output 600 ml  Net 3573.94 ml   Filed Weights   01/01/21 2357  Weight: 86.8 kg    Examination: General: comfortable on ventilator HENT: atraumatic normocephalic Lungs: clear, no wheezes, no rhonchi Cardiovascular: S1, S2, sinus tachycardia Abdomen: heavy surgical dressing, no bleeding Extremities: no oedema Neuro: pupils symmetrical, still under anesthesia effect, sedated GU: normal    Assessment & Plan:  Intra-abdominal sepsis with shock - peritonitis secondary to perforated appendix and abdominal abscess POD#0 of diagnostic laparoscopy, exploratory laparotomy, ileocecectomy, drainage of intra-abdominal abscess Plan: continue antibiotic- Zosyn, IVF, pain control  2. Acute hypoxic respiratory failure secondary to intra-abdominal sepsis Plan: lung protective vent protocol, Duo nebs, vent wean and extubate when appropriate.  3. Acute kidney injury secondary to ATN superimposed on CKD 3A  - Mild hyperkalemia Plan: hydrate, avoid nephrotoxins, F/U K+ levels  4.  Hx of Type 2 DM with hyperglycemia Plan: target glucose management for adequate healing and infection control.  5. Hx of nonischemic cardiomyopathy    - non obstructive CAD Plan: restart beta blocker when  hemodynamically/ off vasopressors safe and ok to use GI tract.  6. Hx Hypertension Plan: hold antihypertensive meds until off vasopressors.  7. Hx COPD Plan: duo nebs  8. Hx Gout Plan: restart home meds when ok to use GI tract.   Best Practice (right click and "Reselect all SmartList Selections" daily)   Diet/type: NPO DVT prophylaxis: SCD GI prophylaxis: H2B Lines: N/A Foley:  Yes, and it is still needed Code Status:  full code Last date of multidisciplinary goals of care discussion   Labs   CBC: Recent Labs  Lab 01/01/21 1023 01/02/21 0025  WBC 9.1  --   NEUTROABS 7.2  --   HGB 16.7 11.9*  HCT 52.8* 35.0*  MCV 100.4*  --   PLT 209  --     Basic Metabolic Panel: Recent Labs  Lab 01/01/21 1023 01/02/21 0025  NA 134* 135  K 5.2* 5.0  CL 98  --   CO2 19*  --   GLUCOSE 279*  --   BUN 20  --   CREATININE 1.51*  --   CALCIUM 10.4*  --    GFR: Estimated Creatinine Clearance: 54.2 mL/min (A) (by C-G formula based on SCr of 1.51 mg/dL (H)). Recent Labs  Lab 01/01/21 1023 01/01/21 1028 01/01/21 1228 01/01/21 1658 01/01/21 1927  WBC 9.1  --   --   --   --   LATICACIDVEN  --  2.3* 3.0* 1.7 1.6    Liver Function Tests: Recent Labs  Lab 01/01/21 1023  AST 24  ALT 19  ALKPHOS 103  BILITOT 1.2  PROT 8.5*  ALBUMIN 4.5   Recent Labs  Lab 01/01/21 1023  LIPASE 31   No results for input(s): AMMONIA in the last 168 hours.  ABG    Component Value Date/Time   PHART 7.364 01/02/2021 0025   PCO2ART 42.4 01/02/2021 0025   PO2ART 205 (H) 01/02/2021 0025   HCO3 23.9 01/02/2021 0025   TCO2 25 01/02/2021 0025   ACIDBASEDEF 1.0 01/02/2021 0025   O2SAT 100.0 01/02/2021 0025     Coagulation Profile: Recent Labs  Lab 01/01/21 1340  INR 1.1    Cardiac Enzymes: No results for input(s): CKTOTAL, CKMB, CKMBINDEX, TROPONINI in the last 168 hours.  HbA1C: Hgb A1c MFr Bld  Date/Time Value Ref Range Status  06/02/2020 02:44 PM 6.4 4.6 - 6.5 % Final     Comment:    Glycemic Control Guidelines for People with Diabetes:Non Diabetic:  <6%Goal of Therapy: <7%Additional Action Suggested:  >8%   11/09/2019 09:11 AM 7.3 (H) 4.8 - 5.6 % Final    Comment:    (NOTE) Pre diabetes:          5.7%-6.4%  Diabetes:              >6.4%  Glycemic control for   <7.0% adults with diabetes     CBG: Recent Labs  Lab 01/01/21 1928 01/01/21 2350  GLUCAP 263* 220*    Review of Systems:   Unable to obtain- patient sedated on vent  Past Medical History:  He,  has a past medical history of Alcohol abuse, Alcoholic cardiomyopathy ([place]) (03/15/2013), Allergy, Asthma, 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   Surgical History:   Past Surgical History:  Procedure Laterality Date   BIOPSY  11/25/2019   Procedure: BIOPSY;  Surgeon: Ladene Artist, MD;  Location: [place];  Service: Endoscopy;;   CARDIAC CATHETERIZATION     CARDIAC CATHETERIZATION N/A 01/28/2015   Procedure: Left Heart Cath and Coronary Angiography;  Surgeon: Wellington Hampshire, MD;  Location: [place] [facility];  Service: Cardiovascular;  Laterality: N/A;   COLONOSCOPY     ESOPHAGOGASTRODUODENOSCOPY (EGD) WITH PROPOFOL N/A 11/25/2019   Procedure: ESOPHAGOGASTRODUODENOSCOPY (EGD) WITH PROPOFOL;  Surgeon: Ladene Artist, MD;  Location: [facility] ENDOSCOPY;  Service: Endoscopy;  Laterality: N/A;   HIP ARTHROPLASTY Right 03/15/2013   Procedure: ARTHROPLASTY BIPOLAR HIP;  Surgeon: [name], MD;  Location: [place];  Service: Orthopedics;  Laterality: Right;   TOTAL SHOULDER ARTHROPLASTY Right 09/01/2015   Procedure:  RIGHT TOTAL SHOULDER ARTHROPLASTY;  Surgeon: [name], MD;  Location: [place];  Service: Orthopedics;  Laterality: Right;     Social History:   reports that he has been smoking cigarettes. He has a 22.50 pack-year smoking history. He has never used smokeless tobacco. He reports that he does not currently use alcohol after a past usage of about 1.0 standard drink per week. He reports current drug use. Drug: Marijuana.   Family History:  His family history includes Dementia in his brother; Diabetes in his father, mother, and sister; Hypertension in his father and mother. There is no history of Cancer, Heart disease, Stroke, Colon cancer, Esophageal cancer, Rectal cancer, or Stomach cancer.   Allergies No Known Allergies   Home Medications  Prior to Admission medications   Medication Sig Start Date End Date Taking? Authorizing Provider  albuterol (VENTOLIN HFA) 108 (90 Base) MCG/ACT inhaler Inhale 2 puffs into the lungs every 6 (six) hours as needed for wheezing or shortness of breath.   Yes [provider]  allopurinol (ZYLOPRIM) 100 MG tablet Take 1 tablet (100 mg total) by mouth 2 (two) times daily. 12/19/20  Yes [name], NP  aspirin 325 MG tablet Take 162.5 mg by mouth in the morning.   Yes [provider]  atorvastatin (LIPITOR) 20 MG tablet Take 20 mg by mouth daily.   Yes [provider]  carvedilol (COREG) 6.25 MG tablet Take 1 tablet (6.25 mg total) by mouth 2 (two) times daily. 06/03/20  Yes Theora Gianotti, NP  cetirizine (ZYRTEC) 10 MG tablet Take 1 tablet (10 mg total) by mouth daily. 07/07/20  Yes [name], NP  fenofibrate (TRICOR) 145 MG tablet TAKE 1 TABLET BY MOUTH ONCE A DAY 11/23/20  Yes Dutch Quint B, FNP  ferrous sulfate 325 (65 FE) MG tablet Take 1 tablet (325 mg total) by mouth daily with breakfast. 06/02/20  Yes Baity, Coralie Keens, NP  fluticasone (FLONASE) 50 MCG/ACT nasal spray Place 1-2 sprays into both nostrils daily as  needed for allergies or rhinitis.   Yes [provider]  furosemide (LASIX) 40 MG tablet Take 1 tablet by mouth daily.   Yes [provider]  hydroxypropyl methylcellulose / hypromellose (ISOPTO TEARS / GONIOVISC) 2.5 % ophthalmic solution Place 1 drop into both eyes 3 (three) times daily as needed for dry eyes.   Yes [provider]  losartan (COZAAR) 25 MG tablet Take 0.5 tablets (12.5  mg total) by mouth daily. 12/19/20 03/19/21 Yes [name], NP  magnesium oxide (MAG-OX) 400 MG tablet Take 1 tablet (400 mg total) by mouth daily. 12/19/20  Yes [name], NP  multivitamin (ONE-A-DAY MEN'S) TABS tablet Take 1 tablet by mouth daily with breakfast.   Yes [provider]  Omega-3 Fatty Acids (FISH OIL PO) Take 1 capsule by mouth daily.   Yes [provider]  pantoprazole (PROTONIX) 40 MG tablet Take 1 tablet (40 mg total) by mouth daily. 06/02/20  Yes Baity, Coralie Keens, NP  triamcinolone cream (KENALOG) 0.1 % APPLY TO AFFECTED AREAS TWICE A DAY AS NEEDED. AVOID FACE, GROIN, OR UNDERARMS. Patient taking differently: Apply 1 application topically 2 (two) times daily as needed (dry skin, rash). 11/01/20  Yes Kennyth Arnold, FNP     Critical care time: 75 mins

## 2021-01-02 NOTE — Procedures (Signed)
Extubation Procedure Note  Patient Details:   Name: Travis [name] DOB: 1956-01-13 MRN: [id number]   Airway Documentation:    Vent end date: 01/02/21 Vent end time: 1645   Evaluation  O2 sats: currently acceptable Complications: No apparent complications Patient did tolerate procedure well. Bilateral Breath Sounds: Clear   Pt extubated per MD & placed on 6L [place]. Pt able to speak & cough, but in a lot of pain currently. Will continue to monitor.  Kathie Dike 01/02/2021, 4:52 PM

## 2021-01-02 NOTE — Progress Notes (Signed)
[place] Progress Note Patient Name: Travis [name] DOB: 1956-03-06 MRN: [id number]   Date of Service  01/02/2021  HPI/Events of Note  Magnesium 1.6  eICU Interventions  Replaced      Intervention Category Intermediate Interventions: Electrolyte abnormality - evaluation and management  Mauri [place], P 01/02/2021, 5:30 AM

## 2021-01-02 NOTE — Progress Notes (Signed)
NAME:  Travis [name], MRN:  [id number], DOB:  08-16-1955, LOS: 1 ADMISSION DATE:  01/01/2021, CONSULTATION DATE:  01/01/2021 REFERRING MD:  Dr Georganna Skeans, CHIEF COMPLAINT:  abdominal pain   History of Present Illness:  [name]. Travis [name] is a 65 year old gentleman with an extended PMH that includes but not limited to CHF, CKD 3A, COPD, CRA O, TIA, diabetes, hypertension, gout, hyperlipidemia, CAD (nonobstructive). She presented to the ED on 01/01/21 with complaints of right lower quadrant abdominal pain. He was found to be pyrexial and tachycardiac. Work up included a CT scan of the abdomen and pelvis which revealed:  "Circumferential mucosal thickening and indistinctness of the bowel wall at the base of the appendix and the distal cecum at the ileocecal junction. There is an associated milder mucosal thickening and indistinctness of the terminal ileum. The distal appendix is decompressed, contains gas and fecal matter and overall has normal appearance. Small amount of free fluid and free gas in the right lower quadrant of the abdomen. These findings are likely due to acute appendicitis with perforation. It is unclear however whether the mucosal thickening of the cecum is reactive or is the source of appendiceal obstruction. Underlying malignancy such as mucinous tumor is in the differential diagnosis. 2. Small foci of free intraperitoneal gas and small amount of fluid scattered in the mesentery of the right lower and upper quadrants of the abdomen, and in perihepatic location."  Patient was therefore taken to the operating room where he was found to have a perforated appendix and abdominal abscess. He is S/P DIAGNOSTIC LAPAROSCOPY; EXPLORATORY LAPAROTOMY, ILECECECTOMY, DRAINAGE OF INTRA-ABDOMINAL ABSCESS  The patient is now in the ICU and on mechanical ventilation.  He arrived on Neosynephrine drip for vasopressor support and on IVF.  I have seen and examined the patient.  Pertinent   Medical History  CKD 3A COPD TIA DM  Significant Hospital Events: Including procedures, antibiotic start and stop dates in addition to other pertinent events   10/9: ex lap, remained intubated  Interim History / Subjective:  10/10: remained intubated overnight. Sedation being held for sbt  Objective   Blood pressure 104/68, pulse (!) 103, temperature 99.2 F (37.3 C), temperature source Axillary, resp. rate (!) 0, height 6' (1.829 m), weight 86.8 kg, SpO2 94 %.    Vent Mode: PRVC FiO2 (%):  [40 %-100 %] 50 % Set Rate:  [8 bmp-18 bmp] 18 bmp Vt Set:  [id number] mL] 620 mL PEEP:  [5 cmH20] 5 cmH20 Plateau Pressure:  [17 cmH20-22 cmH20] 17 cmH20   Intake/Output Summary (Last 24 hours) at 01/02/2021 0843 Last data filed at 01/02/2021 0800 Gross per 24 hour  Intake 5265.11 ml  Output 940 ml  Net 4325.11 ml   Filed Weights   01/01/21 2357 01/02/21 0500  Weight: 86.8 kg 86.8 kg    Examination: General: comfortable on ventilator, still drowsy HENT: atraumatic normocephalic, eomi, perrla, mmmp Lungs: clear, no wheezes, no rhonchi Cardiovascular: S1, S2, sinus tachycardia Abdomen: heavy surgical dressing, no bleeding, ttp Extremities: no edema, no clubbing, no cyanosis Neuro: non focal, drowsy GU: normal    Assessment & Plan:  Intra-abdominal sepsis with shock - peritonitis secondary to perforated appendix and abdominal abscess POD#1 of diagnostic laparoscopy, exploratory laparotomy, ileocecectomy, drainage of intra-abdominal abscess Plan: continue antibiotic- Zosyn 3 day post source control -neo titrating to map >65, suspect will be able to stop once off sedation  2. Acute hypoxic respiratory failure secondary to intra-abdominal sepsis Plan:  -  lung protective vent protocol -Duo nebs, vent wean and extubate when appropriate. -holding sedation, apnea with trial thus far today.   3. Acute kidney injury secondary to ATN superimposed on CKD 3A  - Mild hyperkalemia:  resolved Plan: hydrate, avoid nephrotoxins, F/U K+ levels -cr rising to 2 -oliguric  4. Hx of Type 2 DM with hyperglycemia Plan: target glucose management for adequate healing and infection control.  5. Hx of nonischemic cardiomyopathy    - non obstructive CAD Plan: restart beta blocker when hemodynamically/ off vasopressors safe and ok to use GI tract.  6. Hx Hypertension Plan: hold antihypertensive meds until off vasopressors. -titrate vasopressor to map >65  7. Hx COPD Plan: duo nebs  8. Hx Gout Plan: restart home meds when ok to use GI tract.   Best Practice (right click and "Reselect all SmartList Selections" daily)   Diet/type: NPO DVT prophylaxis: SCD GI prophylaxis: H2B Lines: N/A Foley:  Yes, and it is still needed Code Status:  full code Last date of multidisciplinary goals of care discussion: pending.   Labs   CBC: Recent Labs  Lab 01/01/21 1023 01/01/21 2218 01/02/21 0025 01/02/21 0321 01/02/21 0428  WBC 9.1  --   --  7.0  --   NEUTROABS 7.2  --   --   --   --   HGB 16.7 11.6* 11.9* 11.8* 12.2*  HCT 52.8* 34.0* 35.0* 36.2* 36.0*  MCV 100.4*  --   --  96.3  --   PLT 209  --   --  200  --     Basic Metabolic Panel: Recent Labs  Lab 01/01/21 1023 01/01/21 2218 01/02/21 0025 01/02/21 0321 01/02/21 0428  NA 134* 135 135 131* 134*  K 5.2* 5.3* 5.0 4.7 4.6  CL 98  --   --  101  --   CO2 19*  --   --  19*  --   GLUCOSE 279*  --   --  266*  --   BUN 20  --   --  23  --   CREATININE 1.51*  --   --  2.01*  --   CALCIUM 10.4*  --   --  8.3*  --   MG  --   --   --  1.6*  --   PHOS  --   --   --  3.7  --    GFR: Estimated Creatinine Clearance: 40.8 mL/min (A) (by C-G formula based on SCr of 2.01 mg/dL (H)). Recent Labs  Lab 01/01/21 1023 01/01/21 1028 01/01/21 1228 01/01/21 1658 01/01/21 1927 01/02/21 0321  WBC 9.1  --   --   --   --  7.0  LATICACIDVEN  --  2.3* 3.0* 1.7 1.6  --     Liver Function Tests: Recent Labs  Lab  01/01/21 1023 01/02/21 0321  AST 24 19  ALT 19 15  ALKPHOS 103 52  BILITOT 1.2 2.5*  PROT 8.5* 5.7*  ALBUMIN 4.5 2.7*   Recent Labs  Lab 01/01/21 1023  LIPASE 31   No results for input(s): AMMONIA in the last 168 hours.  ABG    Component Value Date/Time   PHART 7.409 01/02/2021 0428   PCO2ART 34.2 01/02/2021 0428   PO2ART 73 (L) 01/02/2021 0428   HCO3 21.6 01/02/2021 0428   TCO2 23 01/02/2021 0428   ACIDBASEDEF 2.0 01/02/2021 0428   O2SAT 95.0 01/02/2021 0428     Coagulation Profile: Recent Labs  Lab 01/01/21 1340  INR 1.1    Cardiac Enzymes: No results for input(s): CKTOTAL, CKMB, CKMBINDEX, TROPONINI in the last 168 hours.  HbA1C: Hgb A1c MFr Bld  Date/Time Value Ref Range Status  06/02/2020 02:44 PM 6.4 4.6 - 6.5 % Final    Comment:    Glycemic Control Guidelines for People with Diabetes:Non Diabetic:  <6%Goal of Therapy: <7%Additional Action Suggested:  >8%   11/09/2019 09:11 AM 7.3 (H) 4.8 - 5.6 % Final    Comment:    (NOTE) Pre diabetes:          5.7%-6.4%  Diabetes:              >6.4%  Glycemic control for   <7.0% adults with diabetes     CBG: Recent Labs  Lab 01/01/21 1928 01/01/21 2350 01/02/21 0318 01/02/21 0731  GLUCAP 263* 220* 229* 226*    Critical care time: The patient is critically ill with multiple organ systems failure and requires high complexity decision making for assessment and support, frequent evaluation and titration of therapies, application of advanced monitoring technologies and extensive interpretation of multiple databases.  Critical care time 35 mins. This represents my time independent of the NPs time taking care of the pt. This is excluding procedures.    Audria Nine DO [facility] 01/02/2021, 8:43 AM See Amion for pager If no response to pager, please call [phone] until 1900 After 1900 please call [facility] [phone]

## 2021-01-02 NOTE — Progress Notes (Signed)
Inpatient Diabetes Program Recommendations  AACE/ADA: New Consensus Statement on Inpatient Glycemic Control   Target Ranges:  Prepandial:   less than 140 mg/dL      Peak postprandial:   less than 180 mg/dL (1-2 hours)      Critically ill patients:  140 - 180 mg/dL   Results for Travis [name], Travis [name] (MRN [id number]) as of 01/02/2021 12:53  Ref. Range 01/01/2021 19:28 01/01/2021 23:50 01/02/2021 03:18 01/02/2021 07:31 01/02/2021 11:18  Glucose-Capillary Latest Ref Range: 70 - 99 mg/dL 263 (H) 220 (H) 229 (H) 226 (H) 210 (H)    Review of Glycemic Control  Diabetes history: DM2 Outpatient Diabetes medications: None listed Current orders for Inpatient glycemic control: Novolog 0-15 units Q4H  Inpatient Diabetes Program Recommendations:    Insulin: Noted Novolog correction scale increased today. IF glucose remains consistently over 180 mg/dl, may want to consider ordering Levemir 9 units Q24H (based on 86.8 kg x 0.1 units).  Thanks, Barnie Alderman, RN, MSN, CDE Diabetes Coordinator Inpatient Diabetes Program [phone] (Team Pager from 8am to 5pm)

## 2021-01-02 NOTE — Anesthesia Postprocedure Evaluation (Signed)
Anesthesia Post Note  Patient: Travis [name]  Procedure(s) Performed: DIAGNOSTIC LAPAROSCOPY; EXPLORATORY LAPAROTOMY, ILEUCECECTOMY, DRAINAGE OF ABDOMINAL ABSCESS     Patient location during evaluation: SICU Anesthesia Type: General Level of consciousness: sedated Pain management: pain level controlled Vital Signs Assessment: post-procedure vital signs reviewed and stable Respiratory status: patient remains intubated per anesthesia plan Cardiovascular status: blood pressure returned to baseline and stable Anesthetic complications: no   No notable events documented.              Effie Berkshire

## 2021-01-02 NOTE — Progress Notes (Signed)
eLink Physician-Brief Progress Note Patient Name: Travis [name] DOB: 1956-02-29 MRN: [id number]   Date of Service  01/02/2021  HPI/Events of Note  65 yr old male with hx of alcohol abuse, DM, COPD found to have acute appendicitis.  S/p ex lap.  Now in ICU on vent with minimal vent needs and low dose vasopressor requirement.  eICU Interventions  Chart reviewed.   Hopefully can work towards extubation within next 24 hrs        Mauri Brooklyn, P 01/02/2021, 1:10 AM

## 2021-01-02 NOTE — Progress Notes (Signed)
Pt tolerating cpap will move to extubate.

## 2021-01-03 ENCOUNTER — Inpatient Hospital Stay: Payer: Self-pay

## 2021-01-03 ENCOUNTER — Inpatient Hospital Stay (HOSPITAL_COMMUNITY): Payer: Medicare HMO

## 2021-01-03 DIAGNOSIS — Z4682 Encounter for fitting and adjustment of non-vascular catheter: Secondary | ICD-10-CM | POA: Diagnosis not present

## 2021-01-03 DIAGNOSIS — R52 Pain, unspecified: Secondary | ICD-10-CM | POA: Diagnosis not present

## 2021-01-03 LAB — BASIC METABOLIC PANEL
Anion gap: 11 (ref 5–15)
BUN: 25 mg/dL — ABNORMAL HIGH (ref 8–23)
CO2: 25 mmol/L (ref 22–32)
Calcium: 8.6 mg/dL — ABNORMAL LOW (ref 8.9–10.3)
Chloride: 99 mmol/L (ref 98–111)
Creatinine, Ser: 2.07 mg/dL — ABNORMAL HIGH (ref 0.61–1.24)
GFR, Estimated: 35 mL/min — ABNORMAL LOW (ref >60)
Glucose, Bld: 136 mg/dL — ABNORMAL HIGH (ref 70–99)
Potassium: 3.7 mmol/L (ref 3.5–5.1)
Sodium: 135 mmol/L (ref 135–145)

## 2021-01-03 LAB — URINE CULTURE: Culture: NO GROWTH

## 2021-01-03 LAB — GLUCOSE, CAPILLARY
Glucose-Capillary: 126 mg/dL — ABNORMAL HIGH (ref 70–99)
Glucose-Capillary: 137 mg/dL — ABNORMAL HIGH (ref 70–99)
Glucose-Capillary: 123 mg/dL — ABNORMAL HIGH (ref 70–99)
Glucose-Capillary: 102 mg/dL — ABNORMAL HIGH (ref 70–99)
Glucose-Capillary: 126 mg/dL — ABNORMAL HIGH (ref 70–99)
Glucose-Capillary: 117 mg/dL — ABNORMAL HIGH (ref 70–99)

## 2021-01-03 LAB — SURGICAL PATHOLOGY

## 2021-01-03 MED ORDER — MORPHINE SULFATE (PF) 2 MG/ML IV SOLN
2.0000 mg | INTRAVENOUS | Status: DC | PRN
Start: 2021-01-03 — End: 2021-01-05
  Administered 2021-01-04 – 2021-01-05 (×13): 2 mg via INTRAVENOUS
  Filled 2021-01-03 (×14): qty 1

## 2021-01-03 MED ORDER — DEXTROMETHORPHAN POLISTIREX ER 30 MG/5ML PO SUER
15.0000 mg | Freq: Every evening | ORAL | Status: DC | PRN
  Filled 2021-01-03: qty 5

## 2021-01-03 NOTE — Progress Notes (Signed)
[facility] Progress Note Patient Name: KEAVEN JAYSON DOB: [birth year] MRN: [id number]   Date of Service  01/03/2021  HPI/Events of Note  Pt is getting morphine q4 prn for pain.  It works, but lasts only a couple of hours  eICU Interventions  - changed frequency to q2hr prn     Intervention Category Intermediate Interventions: Pain - evaluation and management  Tilden Dome 01/03/2021, 11:44 PM

## 2021-01-03 NOTE — Progress Notes (Signed)
2 Days Post-Op   Subjective/Chief Complaint: Patient is now extubated Complaining of soreness No flatus or BM Hemodynamically stable Foley out - has voided NG with minimal output   Objective: Vital signs in last 24 hours: Temp:  [98.8 F (37.1 C)-101.6 F (38.7 C)] 99.1 F (37.3 C) (10/11 0816) Pulse Rate:  [97-122] 107 (10/11 0800) Resp:  [9-26] 20 (10/11 0800) BP: (97-140)/(50-82) 111/68 (10/11 0800) SpO2:  [84 %-97 %] 95 % (10/11 0800) Arterial Line BP: (97-174)/(49-66) 124/54 (10/11 0500) FiO2 (%):  [40 %-50 %] 40 % (10/11 0728) Weight:  [86.5 kg] 86.5 kg (10/11 0321) Last BM Date:  (PTA)  Intake/Output from previous day: 10/10 0701 - 10/11 0700 In: 573.5 [I.V.:431.8; IV Piggyback:141.7] Out: 1055 [Urine:850; Emesis/NG output:100; Drains:105] Intake/Output this shift: Total I/O In: 12.4 [IV Piggyback:12.4] Out: -   General appearance: alert, cooperative Abd - mildly distended, quiet Midline wound - clean, minimal drainage Drain - serosanguinous output, no purulence   Lab Results:  Recent Labs    01/01/21 1023 01/01/21 2218 01/02/21 0321 01/02/21 0428  WBC 9.1  --  7.0  --   HGB 16.7   < > 11.8* 12.2*  HCT 52.8*   < > 36.2* 36.0*  PLT 209  --  200  --    < > = values in this interval not displayed.   BMET Recent Labs    01/02/21 0321 01/02/21 0428 01/03/21 0304  NA 131* 134* 135  K 4.7 4.6 3.7  CL 101  --  99  CO2 19*  --  25  GLUCOSE 266*  --  136*  BUN 23  --  25*  CREATININE 2.01*  --  2.07*  CALCIUM 8.3*  --  8.6*   PT/INR Recent Labs    01/01/21 1340  LABPROT 14.4  INR 1.1   ABG Recent Labs    01/02/21 0025 01/02/21 0428  PHART 7.364 7.409  HCO3 23.9 21.6    Studies/Results: CT Abdomen Pelvis W Contrast  Addendum Date: 01/01/2021   ADDENDUM REPORT: 01/01/2021 16:38 ADDENDUM: These results were called by telephone at the time of interpretation on 01/01/2021 at 4:35 pm to provider Dr. Darl Householder , Who verbally acknowledged these  results. Electronically Signed   By: Fidela Salisbury M.D.   On: 01/01/2021 16:38   Result Date: 01/01/2021 CLINICAL DATA:  Abdominal pain. EXAM: CT ABDOMEN AND PELVIS WITH CONTRAST TECHNIQUE: Multidetector CT imaging of the abdomen and pelvis was performed using the standard protocol following bolus administration of intravenous contrast. CONTRAST:  139m OMNIPAQUE IOHEXOL 300 MG/ML  SOLN COMPARISON:  None. FINDINGS: Lower chest: No acute abnormality. Hepatobiliary: No focal liver abnormality is seen. No gallstones, gallbladder wall thickening, or biliary dilatation. Pancreas: Unremarkable. No pancreatic ductal dilatation or surrounding inflammatory changes. Spleen: Normal in size without focal abnormality. Adrenals/Urinary Tract: Adrenal glands are unremarkable. Kidneys are normal, without renal calculi, focal lesion, or hydronephrosis. Bladder is unremarkable. Stomach/Bowel: The stomach and small bowel are normal. There is circumferential mucosal thickening at the base of the appendix and the cecum at the ileocecal junction. There is also indistinctness of the bowel wall of the proximal appendix. The distal appendix is decompressed, contains gas and fecal matter and overall has normal appearance. There is small amount of free gas and free fluid at the base of appendix and surrounding the cecum. The free gas appears to originate from the base of the appendix. Inflammatory fat stranding surrounds the base of the appendix and distal cecum. Vascular/Lymphatic: Aortic  atherosclerosis. No enlarged abdominal or pelvic lymph nodes. Reproductive: Prostate is unremarkable. Other: No abdominal wall hernia or abnormality. Small amount of free fluid in the right lower quadrant of the abdomen along the pericolic gutter. Additional small foci of gas scattered in the mesentery of the right lower and upper quadrants, and in perihepatic location. Musculoskeletal: Advanced spondylosis of lower lumbosacral spine. IMPRESSION: 1.  Circumferential mucosal thickening and indistinctness of the bowel wall at the base of the appendix and the distal cecum at the ileocecal junction. There is an associated milder mucosal thickening and indistinctness of the terminal ileum. The distal appendix is decompressed, contains gas and fecal matter and overall has normal appearance. Small amount of free fluid and free gas in the right lower quadrant of the abdomen. These findings are likely due to acute appendicitis with perforation. It is unclear however whether the mucosal thickening of the cecum is reactive or is the source of appendiceal obstruction. Underlying malignancy such as mucinous tumor is in the differential diagnosis. 2. Small foci of free intraperitoneal gas and small amount of fluid scattered in the mesentery of the right lower and upper quadrants of the abdomen, and in perihepatic location. Aortic Atherosclerosis (ICD10-I70.0). Electronically Signed: By: Fidela Salisbury M.D. On: 01/01/2021 16:28   Portable Chest x-ray  Result Date: 01/02/2021 CLINICAL DATA:  Respiratory failure EXAM: PORTABLE CHEST 1 VIEW COMPARISON:  05/26/2020 FINDINGS: Endotracheal tube seen 5.8 cm above the carina. Nasogastric tube extends into the upper abdomen just beyond the expected gastroesophageal junction. Lung volumes are small and there is mild elevation of the right hemidiaphragm. Small left pleural effusion is present. No superimposed focal pulmonary infiltrate. No pneumothorax. Cardiac size within normal limits. Pulmonary vascularity is normal. No acute bone abnormality. IMPRESSION: Endotracheal tube in appropriate position. Nasogastric tube may be advanced 5-10 cm to ensure appropriate position within the gastric lumen. Pulmonary hypoinflation. Small left pleural effusion. Electronically Signed   By: Fidela Salisbury M.D.   On: 01/02/2021 00:15   DG Abd Portable 1V  Result Date: 01/01/2021 CLINICAL DATA:  Abdominal pain. EXAM: PORTABLE ABDOMEN - 1  VIEW COMPARISON:  September 06, 2015 FINDINGS: The bowel gas pattern is normal. No radio-opaque calculi or other significant radiographic abnormality are seen. Osteoarthritic changes of the lumbosacral spine. Partially visualized right hip prosthesis. IMPRESSION: Nonobstructive bowel gas pattern. Electronically Signed   By: Fidela Salisbury M.D.   On: 01/01/2021 15:02    Anti-infectives: Anti-infectives (From admission, onward)    Start     Dose/Rate Route Frequency Ordered Stop   01/01/21 2200  piperacillin-tazobactam (ZOSYN) IVPB 3.375 g        3.375 g 12.5 mL/hr over 240 Minutes Intravenous Every 8 hours 01/01/21 1841 01/04/21 2359   01/01/21 2100  piperacillin-tazobactam (ZOSYN) IVPB 3.375 g  Status:  Discontinued        3.375 g 100 mL/hr over 30 Minutes Intravenous Every 8 hours 01/01/21 1836 01/01/21 1841   01/01/21 1345  piperacillin-tazobactam (ZOSYN) IVPB 3.375 g        3.375 g 100 mL/hr over 30 Minutes Intravenous  Once 01/01/21 1337 01/01/21 1430       Assessment/Plan: Perforated appendicitis with abscess POD2 s/p exploratory laparotomy, ileocecectomy, drainage of intraabdominal abscess 01/01/21 Dr. Grandville Silos - NGT with bilious drainage, expect post-op ileus  - cxs pending, continue IV Zosyn  - drain SS this AM, continue drain - BID wet to dry dressing to midline - mobilize with PT/ OT - if not able to start  PO intake in the next 1-2 days will need to consider PICC and TPN   FEN: NPO, IVF, NGT to LIWS, ice chips VTE: ok to start LMWH or SQH today  ID: Zosyn 10/9>>   EtOH abuse Alcohol related cardiomyopathy T2DM Chronic anemia Hx of TIA HTN HLD Gout Hx of GIB CAD Tobacco abuse  Patient to be transferred out to [facility] - we will continue to follow for surgical issues Consider PICC/ TPN since patient will likely have prolonged ileus from intra-abdominal sepsis    LOS: 2 days    Travis [name] 01/03/2021

## 2021-01-03 NOTE — Progress Notes (Addendum)
Initial Nutrition Assessment  DOCUMENTATION CODES:   Non-severe (moderate) malnutrition in context of chronic illness  INTERVENTION:   Recommend hold off on PICC placement and TPN for today. Re-evaluate abdominal exam tomorrow and re-assess need for TPN vs ability to start enteral nutrition (PO vs Cortrak).  NUTRITION DIAGNOSIS:   Moderate Malnutrition related to acute illness, chronic illness (perforated appendix, CAD, cardiomyopathy) as evidenced by mild muscle depletion, mild fat depletion.  GOAL:   Patient will meet greater than or equal to 90% of their needs  MONITOR:   Diet advancement, PO intake, Labs, I & O's  REASON FOR ASSESSMENT:   Rounds    ASSESSMENT:   65 yo male admitted with perforated appendix and abdominal abscess. S/P ex lap, ileocecectomy, drainage of intra-abdominal abscess. PMH includes CHF, CKD 3A, COPD, TIA, DM, HTN, gout, HLD, CAD, alcohol abuse, tobacco abuse.  Discussed patient in ICU rounds and with RN today. Patient was extubated 10/10.  BS are positive today.  Concerns for ileus noted.   NG tube has bilious output, only 100 ml out x 24 hours.  Abdominal drain with 105 ml output x 24 hours.  Plans for PICC placement on hold, may be able to start enteral feedings (PO vs Cortrak) tomorrow since abdomen is soft and he has positive BS today.  Labs reviewed.  CBG: [phone]  Medications reviewed and include Novolog SSI, Protonix, thiamine, IV antibiotics.  Per discussion with patient, he usually eats 2-3 meals and multiple snacks throughout the day at home. Usual weight ~180 lbs. He thinks he may have lost a few pounds recently, but unsure. 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   Weight encounters reviewed. For the past seven months, weight has been trending up. 77.1 kg 06/02/20, currently 86.5 kg (190  lbs).   NFPE revealed mild depletion of muscle and subcutaneous fat mass. Patient meets criteria for moderate malnutrition. This could be related to acute illness (perforated appendix) vs a more chronic malnutrition r/t hx of CAD, cardiomyopathy, and alcohol abuse.   NUTRITION - FOCUSED PHYSICAL EXAM:  Flowsheet Row Most Recent Value  Orbital Region Mild depletion  Upper Arm Region Mild depletion  Thoracic and Lumbar Region Mild depletion  Buccal Region Mild depletion  Temple Region Mild depletion  Clavicle Bone Region Mild depletion  Clavicle and Acromion Bone Region Mild depletion  Scapular Bone Region Mild depletion  Dorsal Hand Mild depletion  Patellar Region No depletion  Anterior Thigh Region No depletion  Posterior Calf Region Mild depletion  Edema (RD Assessment) None  Hair Reviewed  Eyes Reviewed  Mouth Reviewed  Skin Reviewed  Nails Reviewed       Diet Order:   Diet Order             Diet NPO time specified Except for: Ice Chips  Diet effective now                   EDUCATION NEEDS:   No education needs have been identified at this time  Skin:  Skin Assessment: Reviewed RN Assessment (surgical incision to abdomen)  Last BM:  no BM documented  Height:   Ht Readings from Last 1 Encounters:  01/01/21 6' (1.829 m)    Weight:   Wt Readings from Last 1 Encounters:  01/03/21 86.5 kg  BMI:  Body mass index is 25.86 kg/m.  Estimated Nutritional Needs:   Kcal:  2200-2400  Protein:  120-140 gm  Fluid:  2.2-2.4 L    Lucas Mallow, RD, LDN, CNSC Please refer to Amion for contact information.

## 2021-01-03 NOTE — Progress Notes (Signed)
NAME:  Travis [name], MRN:  [id number], DOB:  1955-08-14, LOS: 2 ADMISSION DATE:  01/01/2021, CONSULTATION DATE:  01/01/2021 REFERRING MD:  Dr Georganna Skeans, CHIEF COMPLAINT:  abdominal pain   History of Present Illness:  [name] is a 65 year old gentleman with an extended PMH that includes but not limited to CHF, CKD 3A, COPD, CRA O, TIA, diabetes, hypertension, gout, hyperlipidemia, CAD (nonobstructive). She presented to the ED on 01/01/21 with complaints of right lower quadrant abdominal pain. He was found to be pyrexial and tachycardiac. Work up included a CT scan of the abdomen and pelvis which revealed:  "Circumferential mucosal thickening and indistinctness of the bowel wall at the base of the appendix and the distal cecum at the ileocecal junction. There is an associated milder mucosal thickening and indistinctness of the terminal ileum. The distal appendix is decompressed, contains gas and fecal matter and overall has normal appearance. Small amount of free fluid and free gas in the right lower quadrant of the abdomen. These findings are likely due to acute appendicitis with perforation. It is unclear however whether the mucosal thickening of the cecum is reactive or is the source of appendiceal obstruction. Underlying malignancy such as mucinous tumor is in the differential diagnosis. 2. Small foci of free intraperitoneal gas and small amount of fluid scattered in the mesentery of the right lower and upper quadrants of the abdomen, and in perihepatic location."  Patient was therefore taken to the operating room where he was found to have a perforated appendix and abdominal abscess. He is S/P DIAGNOSTIC LAPAROSCOPY; EXPLORATORY LAPAROTOMY, ILECECECTOMY, DRAINAGE OF INTRA-ABDOMINAL ABSCESS  The patient is now in the ICU and on mechanical ventilation.  He arrived on Neosynephrine drip for vasopressor support and on IVF.  I have seen and examined the patient.  Pertinent   Medical History  CKD 3A COPD TIA DM  Significant Hospital Events: Including procedures, antibiotic start and stop dates in addition to other pertinent events   10/9: ex lap, remained intubated 10/10: extubated  Interim History / Subjective:  10/11: extubated without issue yesterday on supplemental oxygen and doing well. Cont to titrate for sat >92%. No bm or flatus yet but BS actually positive today.  10/10: remained intubated overnight. Sedation being held for sbt  Objective   Blood pressure 111/68, pulse (!) 107, temperature 99.1 F (37.3 C), temperature source Oral, resp. rate 20, height 6' (1.829 m), weight 86.5 kg, SpO2 95 %.    Vent Mode: CPAP;PSV FiO2 (%):  [40 %-50 %] 40 % PEEP:  [5 cmH20] 5 cmH20 Pressure Support:  [8 cmH20] 8 cmH20   Intake/Output Summary (Last 24 hours) at 01/03/2021 1110 Last data filed at 01/03/2021 0800 Gross per 24 hour  Intake 248.31 ml  Output 910 ml  Net -661.69 ml   Filed Weights   01/01/21 2357 01/02/21 0500 01/03/21 0321  Weight: 86.8 kg 86.8 kg 86.5 kg    Examination: General: awake conversant, following commands. No acute distress HENT: atraumatic normocephalic, eomi, perrla, mm dry but pink, ng in place Lungs: clear, no wheezes, no rhonchi Cardiovascular: S1, S2, sinus tachycardia Abdomen: heavy surgical dressing, no bleeding, mildly ttp Extremities: no edema, no clubbing, no cyanosis Neuro: non focal GU: normal    Assessment & Plan:  Intra-abdominal sepsis with shock - peritonitis secondary to perforated appendix and abdominal abscess POD#1 of diagnostic laparoscopy, exploratory laparotomy, ileocecectomy, drainage of intra-abdominal abscess Plan: continue antibiotic- Zosyn 3 day post source control -off  vasopressors  2. Acute hypoxic respiratory failure secondary to intra-abdominal sepsis Plan:  -titrate oxygen to sat >92% -encourage pulm hygiene -PT/OT  3. Acute kidney injury secondary to ATN superimposed on CKD  3A  - Mild hyperkalemia: resolved Plan: hydrate, avoid nephrotoxins, F/U K+ levels -cr stable at 2 -oliguric but improving output to 800/24hr  4. Hx of Type 2 DM with hyperglycemia Plan: target glucose management for adequate healing and infection control.  5. Hx of nonischemic cardiomyopathy    - non obstructive CAD Plan: restart beta blocker when hemodynamically/ off vasopressors safe and ok to use GI tract.  6. Hx Hypertension Plan: holding oral home meds 2/2 inability to utilize GI tract  7. Hx COPD Plan: duo nebs  8. Hx Gout Plan: restart home meds when ok to use GI tract.   Best Practice (right click and "Reselect all SmartList Selections" daily)   Diet/type: NPO DVT prophylaxis: SCD GI prophylaxis: H2B Lines: N/A Foley:  Yes, and it is no longer needed Code Status:  full code Last date of multidisciplinary goals of care discussion: with pt 10/11. Stable for transfer from ICU, we will ask TRH to assume care and CCM will sign off.   Labs   CBC: Recent Labs  Lab 01/01/21 1023 01/01/21 2218 01/02/21 0025 01/02/21 0321 01/02/21 0428  WBC 9.1  --   --  7.0  --   NEUTROABS 7.2  --   --   --   --   HGB 16.7 11.6* 11.9* 11.8* 12.2*  HCT 52.8* 34.0* 35.0* 36.2* 36.0*  MCV 100.4*  --   --  96.3  --   PLT 209  --   --  200  --     Basic Metabolic Panel: Recent Labs  Lab 01/01/21 1023 01/01/21 2218 01/02/21 0025 01/02/21 0321 01/02/21 0428 01/03/21 0304  NA 134* 135 135 131* 134* 135  K 5.2* 5.3* 5.0 4.7 4.6 3.7  CL 98  --   --  101  --  99  CO2 19*  --   --  19*  --  25  GLUCOSE 279*  --   --  266*  --  136*  BUN 20  --   --  23  --  25*  CREATININE 1.51*  --   --  2.01*  --  2.07*  CALCIUM 10.4*  --   --  8.3*  --  8.6*  MG  --   --   --  1.6*  --   --   PHOS  --   --   --  3.7  --   --    GFR: Estimated Creatinine Clearance: 39.6 mL/min (A) (by C-G formula based on SCr of 2.07 mg/dL (H)). Recent Labs  Lab 01/01/21 1023 01/01/21 1028  01/01/21 1228 01/01/21 1658 01/01/21 1927 01/02/21 0321  WBC 9.1  --   --   --   --  7.0  LATICACIDVEN  --  2.3* 3.0* 1.7 1.6  --     Liver Function Tests: Recent Labs  Lab 01/01/21 1023 01/02/21 0321  AST 24 19  ALT 19 15  ALKPHOS 103 52  BILITOT 1.2 2.5*  PROT 8.5* 5.7*  ALBUMIN 4.5 2.7*   Recent Labs  Lab 01/01/21 1023  LIPASE 31   No results for input(s): AMMONIA in the last 168 hours.  ABG    Component Value Date/Time   PHART 7.409 01/02/2021 0428   PCO2ART 34.2 01/02/2021 0428   PO2ART  73 (L) 01/02/2021 0428   HCO3 21.6 01/02/2021 0428   TCO2 23 01/02/2021 0428   ACIDBASEDEF 2.0 01/02/2021 0428   O2SAT 95.0 01/02/2021 0428     Coagulation Profile: Recent Labs  Lab 01/01/21 1340  INR 1.1    Cardiac Enzymes: No results for input(s): CKTOTAL, CKMB, CKMBINDEX, TROPONINI in the last 168 hours.  HbA1C: Hgb A1c MFr Bld  Date/Time Value Ref Range Status  06/02/2020 02:44 PM 6.4 4.6 - 6.5 % Final    Comment:    Glycemic Control Guidelines for People with Diabetes:Non Diabetic:  <6%Goal of Therapy: <7%Additional Action Suggested:  >8%   11/09/2019 09:11 AM 7.3 (H) 4.8 - 5.6 % Final    Comment:    (NOTE) Pre diabetes:          5.7%-6.4%  Diabetes:              >6.4%  Glycemic control for   <7.0% adults with diabetes     CBG: Recent Labs  Lab 01/02/21 1529 01/02/21 1938 01/02/21 2354 01/03/21 0317 01/03/21 0814  GLUCAP 169* 133* 137* 126* 137*    care time 35 mins. This represents my time independent of the NPs time taking care of the pt. This is excluding procedures.    Audria Nine DO [facility] 01/03/2021, 11:10 AM See Amion for pager If no response to pager, please call [phone] until 1900 After 1900 please call [facility] [phone]

## 2021-01-03 NOTE — Progress Notes (Signed)
NG tube advanced 10 cm per general surgery (Tseui) recommendation. KUB ordered at this time to confirm placement.

## 2021-01-04 ENCOUNTER — Inpatient Hospital Stay (HOSPITAL_COMMUNITY): Payer: Medicare HMO

## 2021-01-04 DIAGNOSIS — R059 Cough, unspecified: Secondary | ICD-10-CM | POA: Diagnosis not present

## 2021-01-04 DIAGNOSIS — J439 Emphysema, unspecified: Secondary | ICD-10-CM | POA: Diagnosis not present

## 2021-01-04 DIAGNOSIS — I5022 Chronic systolic (congestive) heart failure: Secondary | ICD-10-CM | POA: Diagnosis not present

## 2021-01-04 DIAGNOSIS — K3532 Acute appendicitis with perforation and localized peritonitis, without abscess: Secondary | ICD-10-CM | POA: Diagnosis not present

## 2021-01-04 DIAGNOSIS — R109 Unspecified abdominal pain: Secondary | ICD-10-CM | POA: Diagnosis not present

## 2021-01-04 DIAGNOSIS — J9811 Atelectasis: Secondary | ICD-10-CM | POA: Diagnosis not present

## 2021-01-04 DIAGNOSIS — N1831 Chronic kidney disease, stage 3a: Secondary | ICD-10-CM | POA: Diagnosis not present

## 2021-01-04 LAB — CBC
HCT: 31.6 % — ABNORMAL LOW (ref 39.0–52.0)
Hemoglobin: 10.4 g/dL — ABNORMAL LOW (ref 13.0–17.0)
MCH: 31.6 pg (ref 26.0–34.0)
MCHC: 32.9 g/dL (ref 30.0–36.0)
MCV: 96 fL (ref 80.0–100.0)
Platelets: 209 10*3/uL (ref 150–400)
RBC: 3.29 MIL/uL — ABNORMAL LOW (ref 4.22–5.81)
RDW: 15.3 % (ref 11.5–15.5)
WBC: 9 10*3/uL (ref 4.0–10.5)
nRBC: 0 % (ref 0.0–0.2)

## 2021-01-04 LAB — BASIC METABOLIC PANEL
Anion gap: 10 (ref 5–15)
BUN: 24 mg/dL — ABNORMAL HIGH (ref 8–23)
CO2: 24 mmol/L (ref 22–32)
Calcium: 9 mg/dL (ref 8.9–10.3)
Chloride: 103 mmol/L (ref 98–111)
Creatinine, Ser: 1.9 mg/dL — ABNORMAL HIGH (ref 0.61–1.24)
GFR, Estimated: 39 mL/min — ABNORMAL LOW (ref >60)
Glucose, Bld: 130 mg/dL — ABNORMAL HIGH (ref 70–99)
Potassium: 3.8 mmol/L (ref 3.5–5.1)
Sodium: 137 mmol/L (ref 135–145)

## 2021-01-04 LAB — TROPONIN I (HIGH SENSITIVITY)
Troponin I (High Sensitivity): 34 ng/L — ABNORMAL HIGH (ref <18)
Troponin I (High Sensitivity): 26 ng/L — ABNORMAL HIGH (ref <18)

## 2021-01-04 LAB — MAGNESIUM: Magnesium: 2.2 mg/dL (ref 1.7–2.4)

## 2021-01-04 LAB — GLUCOSE, CAPILLARY
Glucose-Capillary: 111 mg/dL — ABNORMAL HIGH (ref 70–99)
Glucose-Capillary: 114 mg/dL — ABNORMAL HIGH (ref 70–99)
Glucose-Capillary: 114 mg/dL — ABNORMAL HIGH (ref 70–99)
Glucose-Capillary: 118 mg/dL — ABNORMAL HIGH (ref 70–99)
Glucose-Capillary: 118 mg/dL — ABNORMAL HIGH (ref 70–99)
Glucose-Capillary: 129 mg/dL — ABNORMAL HIGH (ref 70–99)

## 2021-01-04 LAB — PHOSPHORUS: Phosphorus: 3 mg/dL (ref 2.5–4.6)

## 2021-01-04 LAB — LACTIC ACID, PLASMA: Lactic Acid, Venous: 0.8 mmol/L (ref 0.5–1.9)

## 2021-01-04 MED ORDER — ACETAMINOPHEN 10 MG/ML IV SOLN
1000.0000 mg | Freq: Four times a day (QID) | INTRAVENOUS | Status: AC
  Administered 2021-01-04 – 2021-01-05 (×4): 1000 mg via INTRAVENOUS
  Filled 2021-01-04 (×4): qty 100

## 2021-01-04 MED ORDER — HEPARIN SODIUM (PORCINE) 5000 UNIT/ML IJ SOLN
5000.0000 [IU] | Freq: Three times a day (TID) | INTRAMUSCULAR | Status: DC
  Administered 2021-01-04 – 2021-01-17 (×38): 5000 [IU] via SUBCUTANEOUS
  Filled 2021-01-04 (×38): qty 1

## 2021-01-04 MED ORDER — POTASSIUM CHLORIDE 10 MEQ/100ML IV SOLN
10.0000 meq | INTRAVENOUS | Status: AC
  Administered 2021-01-04 (×3): 10 meq via INTRAVENOUS
  Filled 2021-01-04 (×3): qty 100

## 2021-01-04 MED ORDER — METOPROLOL TARTRATE 5 MG/5ML IV SOLN
2.5000 mg | Freq: Three times a day (TID) | INTRAVENOUS | Status: DC
  Administered 2021-01-04 – 2021-01-05 (×5): 2.5 mg via INTRAVENOUS
  Filled 2021-01-04 (×5): qty 5

## 2021-01-04 MED ORDER — METHOCARBAMOL 1000 MG/10ML IJ SOLN
500.0000 mg | Freq: Three times a day (TID) | INTRAVENOUS | Status: DC
  Administered 2021-01-04 – 2021-01-05 (×4): 500 mg via INTRAVENOUS
  Filled 2021-01-04: qty 500
  Filled 2021-01-04 (×2): qty 5
  Filled 2021-01-04: qty 500
  Filled 2021-01-04: qty 5
  Filled 2021-01-04: qty 500
  Filled 2021-01-04: qty 5

## 2021-01-04 NOTE — Progress Notes (Signed)
Patient given to me by Janett Billow RN at approximately 1615. Patient stable and reassessed by myself with no changes from previous RN assessment that was done at 1600.

## 2021-01-04 NOTE — Progress Notes (Signed)
While RN was in med room getting patient pain meds patient removed NG "because it hurt" after education from RN that unfortunately it cannot be removed at this time. On call with General Surgery allowed NG tube to stay out until further notice.

## 2021-01-04 NOTE — Progress Notes (Signed)
eLink Physician-Brief Progress Note Patient Name: Travis [name] DOB: September 02, 1955 MRN: [id number]   Date of Service  01/04/2021  HPI/Events of Note  - pt developed chest heaviness and dyspnea - soon after, abruptly went from a NSR to a narrow complex regular tachyarrhythmia in the 140's - abruptly changed back to a NSR without intervention after about a minute - now symptom free  eICU Interventions  - EKG results pending - ordered at troponin, lactate, mag level     Intervention Category Major Interventions: Arrhythmia - evaluation and management  Tilden Dome 01/04/2021, 4:14 AM

## 2021-01-04 NOTE — Evaluation (Addendum)
Occupational Therapy Evaluation Patient Details Name: Travis [name] MRN: [id number] DOB: 01-Feb-1956 Today's Date: 01/04/2021   History of Present Illness Pt is a 65 y/o male admitted 10/9 with complaints of R lower quadrant abdominal pain. Found with intra-abdominal sepsis. CT reveals perforated appendix and abdominal abscess; s/p diagnositc laparoscopy, ileocecectomy, drainage of interaabdominal abscess 10/9.  Post op ileus. PMH includes: CHF, CKD, COPD, TIA, diabetes, HTN, gout, CAD.   Clinical Impression   PTA patient independent and driving. Admitted for above and presenting with problem list below, including pain, generalized weakness, nausea with mobilization attempts, and decreased activity tolerance.  Patient requires min assist for mobility to/from EOB with increased time, but limited tolerance OOB due to nausea and requesting to return supine.  Patient requires up to mod assist for UB ADLs and total assist for LB ADLs bed level.  Believe he will progress well once pain and nausea is better controlled, pt appears anxious as well throughout session.  Believe he will benefit from further OT services while admitted and anticipate he will progress to [facility] services (pending progress).  Will follow and update recommendations as appropriate.     Recommendations for follow up therapy are one component of a multi-disciplinary discharge planning process, led by the attending physician.  Recommendations may be updated based on patient status, additional functional criteria and insurance authorization.   Follow Up Recommendations  Home health OT;Supervision/Assistance - 24 hour (pending progress)    Equipment Recommendations  3 in 1 bedside commode    Recommendations for Other Services PT consult     Precautions / Restrictions Precautions Precautions: Fall Precaution Comments: abdominal incision, JP drain R, NG tube Restrictions Weight Bearing Restrictions: No      Mobility Bed  Mobility Overal bed mobility: Needs Assistance Bed Mobility: Supine to Sit;Sit to Supine     Supine to sit: Min assist;HOB elevated Sit to supine: Min assist;HOB elevated   General bed mobility comments: HOB elevated to pivot towards EOB with min assist, use of rails and increased time. Nausated upon sitting EOB and requesting to return supine.  (Educated on rolling due to abdominal incision but pt declined)    Transfers                 General transfer comment: deferred, pt nauseated    Balance Overall balance assessment: Needs assistance Sitting-balance support: Feet supported;Bilateral upper extremity supported Sitting balance-Leahy Scale: Fair Sitting balance - Comments: statically                                   ADL either performed or assessed with clinical judgement   ADL Overall ADL's : Needs assistance/impaired     Grooming: Set up;Bed level   Upper Body Bathing: Minimal assistance;Bed level   Lower Body Bathing: Total assistance;Bed level   Upper Body Dressing : Moderate assistance;Bed level   Lower Body Dressing: Total assistance;Bed level     Toilet Transfer Details (indicate cue type and reason): deferred         Functional mobility during ADLs: Minimal assistance;Cueing for safety General ADL Comments: pt limited by pain, nausea     Vision   Vision Assessment?: No apparent visual deficits     Perception     Praxis      Pertinent Vitals/Pain Pain Assessment: 0-10 Pain Score: 6  Pain Location: throat and stomach Pain Descriptors / Indicators: Discomfort;Grimacing;Operative site guarding;Sore Pain Intervention(s): Limited  activity within patient's tolerance;Monitored during session;Repositioned     Hand Dominance Right   Extremity/Trunk Assessment Upper Extremity Assessment Upper Extremity Assessment: Overall WFL for tasks assessed   Lower Extremity Assessment Lower Extremity Assessment: Defer to PT evaluation        Communication Communication Communication: No difficulties   Cognition Arousal/Alertness: Awake/alert Behavior During Therapy: Anxious Overall Cognitive Status: Within Functional Limits for tasks assessed                                 General Comments: appears WFL, limited by anxiety and reports "I just want to make sure i'm put back together"   General Comments  VSS on 5L [place]    Exercises     Shoulder Instructions      Home Living Family/patient expects to be discharged to:: Private residence Living Arrangements: Alone Available Help at Discharge: Family;Available PRN/intermittently Type of Home: Apartment Home Access: Stairs to enter Entrance Stairs-Number of Steps: 2 Entrance Stairs-Rails: Right Home Layout: One level     Bathroom Shower/Tub: Teacher, early years/pre: Standard     Home Equipment: Cane - single point;Grab bars - toilet          Prior Functioning/Environment Level of Independence: Independent        Comments: used cane intermittently as needed, independent ADLs, IADLs, and driving  but not working        OT Problem List: Decreased strength;Decreased activity tolerance;Impaired balance (sitting and/or standing);Decreased safety awareness;Decreased knowledge of use of DME or AE;Decreased knowledge of precautions;Pain      OT Treatment/Interventions: Self-care/ADL training;Therapeutic exercise;DME and/or AE instruction;Therapeutic activities;Patient/family education;Balance training;Energy conservation    OT Goals(Current goals can be found in the care plan section) Acute Rehab OT Goals Patient Stated Goal: to do rehab at home OT Goal Formulation: With patient Time For Goal Achievement: 01/18/21 Potential to Achieve Goals: Good  OT Frequency: Min 2X/week   Barriers to D/C:            Co-evaluation              AM-PAC OT "6 Clicks" Daily Activity     Outcome Measure Help from another person eating  meals?: Total Help from another person taking care of personal grooming?: A Little Help from another person toileting, which includes using toliet, bedpan, or urinal?: Total Help from another person bathing (including washing, rinsing, drying)?: A Lot Help from another person to put on and taking off regular upper body clothing?: A Lot Help from another person to put on and taking off regular lower body clothing?: Total 6 Click Score: 10   End of Session Equipment Utilized During Treatment: Oxygen (5L) Nurse Communication: Mobility status;Other (comment) (nausea at EOB)  Activity Tolerance: Other (comment);No increased pain (limited by nausea) Patient left: in chair;with call bell/phone within reach;with bed alarm set;with nursing/sitter in room;with SCD's reapplied  OT Visit Diagnosis: Other abnormalities of gait and mobility (R26.89);Muscle weakness (generalized) (M62.81);Pain Pain - part of body:  (abdomen, throat)                Time: [id number] OT Time Calculation (min): 18 min Charges:  OT General Charges $OT Visit: 1 Visit OT Evaluation $OT Eval Moderate Complexity: 1 Mod  Travis [name], OT Acute Rehabilitation Services Pager [phone] Office [phone]   Delight Stare 01/04/2021, 1:22 PM

## 2021-01-04 NOTE — Evaluation (Signed)
Physical Therapy Evaluation Patient Details Name: Travis [name] MRN: [id number] DOB: May 31, 1955 Today's Date: 01/04/2021  History of Present Illness  Pt is a 65 y/o male admitted 10/9 with complaints of R lower quadrant abdominal pain. Found with intra-abdominal sepsis. CT reveals perforated appendix and abdominal abscess; s/p diagnositc laparoscopy, ileocecectomy, drainage of interaabdominal abscess 10/9.  Post op ileus. PMH includes: CHF, CKD, COPD, TIA, diabetes, HTN, gout, CAD.  Clinical Impression  Pt admitted with above. Reports improved nausea and fair pain control; premedicated prior to session. Pt requiring min guard assist to transfer from bed to chair, where he was then able to participate in seated exercises. SpO2 89-95% on 5L O2. Pt performed x 8 reps of flutter valve. Suspect good progress given pain control/management. Will progress mobility as tolerated.       Recommendations for follow up therapy are one component of a multi-disciplinary discharge planning process, led by the attending physician.  Recommendations may be updated based on patient status, additional functional criteria and insurance authorization.  Follow Up Recommendations Home health PT     Equipment Recommendations  3in1 (PT)    Recommendations for Other Services       Precautions / Restrictions Precautions Precautions: Fall Precaution Comments: abdominal incision, JP drain R, NG tube Restrictions Weight Bearing Restrictions: No      Mobility  Bed Mobility Overal bed mobility: Needs Assistance Bed Mobility: Supine to Sit     Supine to sit: Min assist;HOB elevated     General bed mobility comments: MinA for trunk support to upright    Transfers Overall transfer level: Needs assistance Equipment used: None Transfers: Sit to/from [facility] Sit to Stand: Min guard Stand pivot transfers: Min guard       General transfer comment: Min guard for  safety  Ambulation/Gait                Stairs            Wheelchair Mobility    Modified Rankin (Stroke Patients Only)       Balance Overall balance assessment: Needs assistance Sitting-balance support: Feet supported;Bilateral upper extremity supported Sitting balance-Leahy Scale: Good     Standing balance support: No upper extremity supported;During functional activity Standing balance-Leahy Scale: Good                               Pertinent Vitals/Pain Pain Assessment: Faces Faces Pain Scale: Hurts little more Pain Location: stomach Pain Descriptors / Indicators: Discomfort;Grimacing;Operative site guarding;Sore Pain Intervention(s): Limited activity within patient's tolerance;Monitored during session;Premedicated before session    Travis [name] expects to be discharged to:: Private residence Living Arrangements: Alone Available Help at Discharge: Family;Available PRN/intermittently Type of Home: Apartment Home Access: Stairs to enter Entrance Stairs-Rails: Right Entrance Stairs-Number of Steps: 2 Home Layout: One level Home Equipment: Cane - single point;Grab bars - toilet      Prior Function Level of Independence: Independent         Comments: used cane intermittently as needed, independent ADLs, IADLs, and driving  but not working     Hand Dominance   Dominant Hand: Right    Extremity/Trunk Assessment   Upper Extremity Assessment Upper Extremity Assessment: Overall WFL for tasks assessed    Lower Extremity Assessment Lower Extremity Assessment: Overall WFL for tasks assessed       Communication   Communication: No difficulties  Cognition Arousal/Alertness: Awake/alert Behavior During Therapy: [facility]  for tasks assessed/performed Overall Cognitive Status: Within Functional Limits for tasks assessed                                        General Comments      Exercises General  Exercises - Lower Extremity Long Arc Quad: Both;10 reps;Seated Hip Flexion/Marching: Both;10 reps;Seated Toe Raises: Both;10 reps;Seated Heel Raises: Both;10 reps;Seated   Assessment/Plan    PT Assessment Patient needs continued PT services  PT Problem List Decreased activity tolerance;Decreased mobility;Pain       PT Treatment Interventions Gait training;Stair training;Functional mobility training;Therapeutic activities;Therapeutic exercise;Balance training;Patient/family education    PT Goals (Current goals can be found in the Care Plan section)  Acute Rehab PT Goals Patient Stated Goal: to do rehab at home PT Goal Formulation: With patient Time For Goal Achievement: 01/18/21 Potential to Achieve Goals: Good    Frequency Min 3X/week   Barriers to discharge        Co-evaluation               AM-PAC PT "6 Clicks" Mobility  Outcome Measure Help needed turning from your back to your side while in a flat bed without using bedrails?: None Help needed moving from lying on your back to sitting on the side of a flat bed without using bedrails?: A Little Help needed moving to and from a bed to a chair (including a wheelchair)?: A Little Help needed standing up from a chair using your arms (e.g., wheelchair or bedside chair)?: A Little Help needed to walk in hospital room?: A Little Help needed climbing 3-5 steps with a railing? : A Lot 6 Click Score: 18    End of Session Equipment Utilized During Treatment: Oxygen Activity Tolerance: Patient tolerated treatment well Patient left: in chair;with call bell/phone within reach Nurse Communication: Mobility status PT Visit Diagnosis: Difficulty in walking, not elsewhere classified (R26.2);Pain Pain - part of body:  (abdomen)    Time: [id number] PT Time Calculation (min) (ACUTE ONLY): 18 min   Charges:   PT Evaluation $PT Eval Moderate Complexity: 1 Mod          Travis [name], PT, DPT Acute Rehabilitation  Services Pager [phone] Office [phone]   Travis [name] 01/04/2021, 5:02 PM

## 2021-01-04 NOTE — Progress Notes (Signed)
Progress Note  3 Days Post-Op  Subjective: Patient reports generalized abdominal pain. No flatus or BM yet. Has not been out of bed. Requests that I update his niece.   Objective: Vital signs in last 24 hours: Temp:  [98.8 F (37.1 C)-99.7 F (37.6 C)] 98.9 F (37.2 C) (10/12 0700) Pulse Rate:  [99-113] 102 (10/12 0900) Resp:  [15-27] 15 (10/12 0900) BP: (104-142)/(68-103) 126/79 (10/12 0900) SpO2:  [87 %-96 %] 94 % (10/12 0900) FiO2 (%):  [36 %-92 %] 92 % (10/12 0500) Weight:  [84.4 kg] 84.4 kg (10/12 0352) Last BM Date:  (PTA)  Intake/Output from previous day: 10/11 0701 - 10/12 0700 In: 270.6 [I.V.:117.9; IV Piggyback:152.7] Out: 920 [Urine:850; Drains:70] Intake/Output this shift: Total I/O In: 29.4 [IV Piggyback:29.4] Out: 80 [Urine:75]  PE: General: WD, elderly appearing male who is laying in bed and in NAD Heart: sinus tachycardia in the low 100s Lungs: wheezing bilaterally, normal effort Abd: soft, appropriately ttp, moderately distended, +BS, drain with SS fluid, midline wound clean, NGT with bilious drainage MS: all 4 extremities are symmetrical with no cyanosis, clubbing, or edema. Skin: warm and dry with no masses, lesions, or rashes   Lab Results:  Recent Labs    01/02/21 0321 01/02/21 0428 01/04/21 0632  WBC 7.0  --  9.0  HGB 11.8* 12.2* 10.4*  HCT 36.2* 36.0* 31.6*  PLT 200  --  209   BMET Recent Labs    01/03/21 0304 01/04/21 0140  NA 135 137  K 3.7 3.8  CL 99 103  CO2 25 24  GLUCOSE 136* 130*  BUN 25* 24*  CREATININE 2.07* 1.90*  CALCIUM 8.6* 9.0   PT/INR Recent Labs    01/01/21 1340  LABPROT 14.4  INR 1.1   CMP     Component Value Date/Time   NA 137 01/04/2021 0140   NA 138 06/20/2020 1034   NA 141 04/20/2014 1019   K 3.8 01/04/2021 0140   K 3.7 04/20/2014 1019   CL 103 01/04/2021 0140   CL 106 04/20/2014 1019   CO2 24 01/04/2021 0140   CO2 30 04/20/2014 1019   GLUCOSE 130 (H) 01/04/2021 0140   GLUCOSE 102 (H)  04/20/2014 1019   BUN 24 (H) 01/04/2021 0140   BUN 18 06/20/2020 1034   BUN 14 04/20/2014 1019   CREATININE 1.90 (H) 01/04/2021 0140   CREATININE 1.18 04/20/2014 1019   CALCIUM 9.0 01/04/2021 0140   CALCIUM 9.3 04/20/2014 1019   PROT 5.7 (L) 01/02/2021 0321   PROT 6.5 06/18/2017 1425   PROT 7.4 04/20/2014 1019   ALBUMIN 2.7 (L) 01/02/2021 0321   ALBUMIN 4.0 06/18/2017 1425   ALBUMIN 3.4 04/20/2014 1019   AST 19 01/02/2021 0321   AST 21 04/20/2014 1019   ALT 15 01/02/2021 0321   ALT 20 04/20/2014 1019   ALKPHOS 52 01/02/2021 0321   ALKPHOS 97 04/20/2014 1019   BILITOT 2.5 (H) 01/02/2021 0321   BILITOT 0.4 06/18/2017 1425   BILITOT 0.6 04/20/2014 1019   GFRNONAA 39 (L) 01/04/2021 0140   GFRNONAA >60 04/20/2014 1019   GFRNONAA >60 11/19/2012 1107   GFRAA 53 (L) 01/13/2020 1410   GFRAA >60 04/20/2014 1019   GFRAA >60 11/19/2012 1107   Lipase     Component Value Date/Time   LIPASE 31 01/01/2021 1023   LIPASE 112 04/20/2014 1019       Studies/Results: DG Abd 1 View  Result Date: 01/04/2021 CLINICAL DATA:  Ileus and abdominal  pain EXAM: ABDOMEN - 1 VIEW COMPARISON:  01/03/2021 FINDINGS: Enteric tube is present with tip in the right upper quadrant consistent with location in the distal stomach or possibly proximal duodenum. Nondilated gas-filled small bowel with scattered gas in the colon. Changes likely representing ileus. No radiopaque stones. Catheter in the lower abdomen may represent a drainage catheter or possibly jejunal catheter. No change in position. Degenerative changes in the spine. Previous right hip arthroplasty. Atelectasis in the lung bases. IMPRESSION: Appliances appear in satisfactory position. Gas-filled nondilated small bowel, likely ileus. No definite evidence of obstruction. Similar appearance to previous study. Electronically Signed   By: Lucienne Capers M.D.   On: 01/04/2021 00:22   DG Abd 1 View  Result Date: 01/03/2021 CLINICAL DATA:  Nasogastric  tube placement. EXAM: ABDOMEN - 1 VIEW COMPARISON:  January 01, 2021 FINDINGS: A nasogastric tube is seen with its distal tip overlying the expected region of the duodenal bulb. A few air-filled loops small bowel within the mid left abdomen appear to approach the upper limit of normal caliber. No radio-opaque calculi or other significant radiographic abnormality are seen. IMPRESSION: Nasogastric tube positioning, as described above. Electronically Signed   By: Virgina Norfolk M.D.   On: 01/03/2021 15:45   DG CHEST PORT 1 VIEW  Result Date: 01/04/2021 CLINICAL DATA:  Cough, ileus, and abdominal pain EXAM: PORTABLE CHEST 1 VIEW COMPARISON:  01/01/2021 FINDINGS: Enteric tube is present with tip in the right upper quadrant consistent with location in the distal stomach or proximal duodenum. Shallow inspiration with linear atelectasis in the lung bases. No airspace disease or consolidation. Normal heart size. No pneumothorax. Postoperative changes in the right shoulder. IMPRESSION: Shallow inspiration with atelectasis in the lung bases. Electronically Signed   By: Lucienne Capers M.D.   On: 01/04/2021 00:23   [place] EKG SITE RITE  Result Date: 01/03/2021 If Site Rite image not attached, placement could not be confirmed due to current cardiac rhythm.   Anti-infectives: Anti-infectives (From admission, onward)    Start     Dose/Rate Route Frequency Ordered Stop   01/01/21 2200  piperacillin-tazobactam (ZOSYN) IVPB 3.375 g        3.375 g 12.5 mL/hr over 240 Minutes Intravenous Every 8 hours 01/01/21 1841 01/04/21 2359   01/01/21 2100  piperacillin-tazobactam (ZOSYN) IVPB 3.375 g  Status:  Discontinued        3.375 g 100 mL/hr over 30 Minutes Intravenous Every 8 hours 01/01/21 1836 01/01/21 1841   01/01/21 1345  piperacillin-tazobactam (ZOSYN) IVPB 3.375 g        3.375 g 100 mL/hr over 30 Minutes Intravenous  Once 01/01/21 1337 01/01/21 1430        Assessment/Plan Perforated appendicitis with  abscess POD3 s/p exploratory laparotomy, ileocecectomy, drainage of intraabdominal abscess 01/01/21 Dr. Grandville Silos - NGT with bilious drainage, expect post-op ileus  - cxs with GNR, continue IV Zosyn  - drain SS this AM, continue drain - BID wet to dry dressing to midline - mobilize with PT/ OT - starting clamping trials today - clamp for 4 hrs and allow sips/ice chips, reconnect to LIWS for 2 hrs and record residuals - if not having return in bowel function by tomorrow, will need PICC and TPN - I updated his niece over the phone   FEN: NPO, IVF, NGT clamping trials VTE: start SQH today  ID: Zosyn 10/9>>   EtOH abuse Alcohol related cardiomyopathy T2DM Chronic anemia Hx of TIA HTN HLD Gout Hx of GIB CAD Tobacco  abuse  LOS: 3 days    Norm Parcel, [facility] 01/04/2021, 9:14 AM Please see Amion for pager number during day hours 7:00am-4:30pm

## 2021-01-04 NOTE — Progress Notes (Signed)
[facility]  PROGRESS NOTE  RONAL KALAL [id number] DOB: [birth year] DOA: 01/01/2021 PCP: [name], NP   Brief HPI:   65 year old male with past medical history of CHF, CKD stage IIIa, COPD, TIA, diabetes mellitus type 2, hypertension, gout, hyperlipidemia, CAD, nonobstructive presented to ED on 10/9/ -and abdominal abscess and abdominal abscess.  He is s/p diagnostic laparoscopy, exploratory mucosectomy, dileocecectomy, drainage of intra-abdominal abscess. He was under the care of intensivist, on mechanical ventilation in ICU/ Currently he is extubated [facility] resume service on 01/04/2021.   Subjective   Patient seen and examined, has NG tube in place, denies abdominal pain.   Assessment/Plan:     Peritonitis/sepsis with shock -Patient presented with peritonitis secondary to perforated appendix and abdominal abscess -S/p diagnostic laparoscopy, laparotomy, ileocecectomy, drainage of intra-abdominal abscess  -He was on IV Zosyn which has been discontinued -Patient is off vasopressors  Acute hypoxemic respiratory failure -Secondary to intra-abdominal sepsis -Resolved  Acute kidney injury on CKD stage III -Secondary to ATN -Renal function is slowly improving -Avoid nephrotoxins  Diabetes mellitus type 2 with hyperglycemia -Continue sliding scale insulin with NovoLog -CBG well controlled  History of nonischemic cardiomyopathy -Nonobstructive CAD  History of hypertension -Patient had 12 beat run of NSVT today -Home Coreg was on hold due to surgery as above -We will start metoprolol 2.5 mg IV every 8 hours  NSVT -Likely from stopping Coreg -Patient magnesium level is 2.2, potassium 3.8 -Goal to keep K greater than 4, mag greater than 2.0 -Follow electrolytes in a.m.  History of COPD -Continue DuoNebs as needed    Scheduled medications:    chlorhexidine  15 mL Mouth Rinse BID   Chlorhexidine Gluconate Cloth  6 each Topical Daily   heparin  injection (subcutaneous)  5,000 Units Subcutaneous Q8H   insulin aspart  0-15 Units Subcutaneous Q4H   ipratropium-albuterol  3 mL Nebulization Q6H   mouth rinse  15 mL Mouth Rinse q12n4p   metoprolol tartrate  2.5 mg Intravenous Q8H   pantoprazole (PROTONIX) IV  40 mg Intravenous Q0600   sodium chloride flush  3 mL Intravenous Q12H   thiamine injection  100 mg Intravenous Daily     Data Reviewed:   CBG:  Recent Labs  Lab 01/03/21 2321 01/04/21 0313 01/04/21 0752 01/04/21 1157 01/04/21 1524  GLUCAP 117* 129* 114* 118* 114*    SpO2: 94 % O2 Flow Rate (L/min): 5 L/min FiO2 (%): 92 %    Vitals:   01/04/21 1526 01/04/21 1600 01/04/21 1615 01/04/21 1700  BP:  111/73  120/77  Pulse: 91 86 91 88  Resp: '20 15 17 20  '$ Temp: 98.5 F (36.9 C)     TempSrc: Oral     SpO2: 96% 95% 95% 94%  Weight:      Height:         Intake/Output Summary (Last 24 hours) at 01/04/2021 1821 Last data filed at 01/04/2021 1600 Gross per 24 hour  Intake 741.82 ml  Output 820 ml  Net -78.18 ml    10/10 1901 - 10/12 0700 In: 403.5 [I.V.:184.8] Out: [id number] [Urine:1300; Drains:120]  Filed Weights   01/02/21 0500 01/03/21 0321 01/04/21 0352  Weight: 86.8 kg 86.5 kg 84.4 kg    Data Reviewed: Basic Metabolic Panel: Recent Labs  Lab 01/01/21 1023 01/01/21 2218 01/02/21 0025 01/02/21 0321 01/02/21 0428 01/03/21 0304 01/04/21 0140 01/04/21 0420 01/04/21 0632  NA 134*   < > 135 131* 134* 135 137  --   --  K 5.2*   < > 5.0 4.7 4.6 3.7 3.8  --   --   CL 98  --   --  101  --  99 103  --   --   CO2 19*  --   --  19*  --  25 24  --   --   GLUCOSE 279*  --   --  266*  --  136* 130*  --   --   BUN 20  --   --  23  --  25* 24*  --   --   CREATININE 1.51*  --   --  2.01*  --  2.07* 1.90*  --   --   CALCIUM 10.4*  --   --  8.3*  --  8.6* 9.0  --   --   MG  --   --   --  1.6*  --   --   --  2.2  --   PHOS  --   --   --  3.7  --   --   --   --  3.0   < > = values in this interval not  displayed.   Liver Function Tests: Recent Labs  Lab 01/01/21 1023 01/02/21 0321  AST 24 19  ALT 19 15  ALKPHOS 103 52  BILITOT 1.2 2.5*  PROT 8.5* 5.7*  ALBUMIN 4.5 2.7*   Recent Labs  Lab 01/01/21 1023  LIPASE 31   No results for input(s): AMMONIA in the last 168 hours. CBC: Recent Labs  Lab 01/01/21 1023 01/01/21 2218 01/02/21 0025 01/02/21 0321 01/02/21 0428 01/04/21 0632  WBC 9.1  --   --  7.0  --  9.0  NEUTROABS 7.2  --   --   --   --   --   HGB 16.7 11.6* 11.9* 11.8* 12.2* 10.4*  HCT 52.8* 34.0* 35.0* 36.2* 36.0* 31.6*  MCV 100.4*  --   --  96.3  --  96.0  PLT 209  --   --  200  --  209   Cardiac Enzymes: No results for input(s): CKTOTAL, CKMB, CKMBINDEX, TROPONINI in the last 168 hours. BNP (last 3 results) No results for input(s): BNP in the last 8760 hours.  ProBNP (last 3 results) Recent Labs    06/02/20 1431  PROBNP 61.0    CBG: Recent Labs  Lab 01/03/21 2321 01/04/21 0313 01/04/21 0752 01/04/21 1157 01/04/21 1524  GLUCAP 117* 129* 114* 118* 114*       Radiology Reports  DG Abd 1 View  Result Date: 01/04/2021 CLINICAL DATA:  Ileus and abdominal pain EXAM: ABDOMEN - 1 VIEW COMPARISON:  01/03/2021 FINDINGS: Enteric tube is present with tip in the right upper quadrant consistent with location in the distal stomach or possibly proximal duodenum. Nondilated gas-filled small bowel with scattered gas in the colon. Changes likely representing ileus. No radiopaque stones. Catheter in the lower abdomen may represent a drainage catheter or possibly jejunal catheter. No change in position. Degenerative changes in the spine. Previous right hip arthroplasty. Atelectasis in the lung bases. IMPRESSION: Appliances appear in satisfactory position. Gas-filled nondilated small bowel, likely ileus. No definite evidence of obstruction. Similar appearance to previous study. Electronically Signed   By: Lucienne Capers M.D.   On: 01/04/2021 00:22   DG Abd 1  View  Result Date: 01/03/2021 CLINICAL DATA:  Nasogastric tube placement. EXAM: ABDOMEN - 1 VIEW COMPARISON:  January 01, 2021 FINDINGS: A nasogastric tube  is seen with its distal tip overlying the expected region of the duodenal bulb. A few air-filled loops small bowel within the mid left abdomen appear to approach the upper limit of normal caliber. No radio-opaque calculi or other significant radiographic abnormality are seen. IMPRESSION: Nasogastric tube positioning, as described above. Electronically Signed   By: Virgina Norfolk M.D.   On: 01/03/2021 15:45   DG CHEST PORT 1 VIEW  Result Date: 01/04/2021 CLINICAL DATA:  Cough, ileus, and abdominal pain EXAM: PORTABLE CHEST 1 VIEW COMPARISON:  01/01/2021 FINDINGS: Enteric tube is present with tip in the right upper quadrant consistent with location in the distal stomach or proximal duodenum. Shallow inspiration with linear atelectasis in the lung bases. No airspace disease or consolidation. Normal heart size. No pneumothorax. Postoperative changes in the right shoulder. IMPRESSION: Shallow inspiration with atelectasis in the lung bases. Electronically Signed   By: Lucienne Capers M.D.   On: 01/04/2021 00:23   [place] EKG SITE RITE  Result Date: 01/03/2021 If Site Rite image not attached, placement could not be confirmed due to current cardiac rhythm.      Antibiotics: Anti-infectives (From admission, onward)    Start     Dose/Rate Route Frequency Ordered Stop   01/01/21 2200  piperacillin-tazobactam (ZOSYN) IVPB 3.375 g        3.375 g 12.5 mL/hr over 240 Minutes Intravenous Every 8 hours 01/01/21 1841 01/04/21 2359   01/01/21 2100  piperacillin-tazobactam (ZOSYN) IVPB 3.375 g  Status:  Discontinued        3.375 g 100 mL/hr over 30 Minutes Intravenous Every 8 hours 01/01/21 1836 01/01/21 1841   01/01/21 1345  piperacillin-tazobactam (ZOSYN) IVPB 3.375 g        3.375 g 100 mL/hr over 30 Minutes Intravenous  Once 01/01/21 1337 01/01/21 1430          DVT prophylaxis: Heparin  Code Status: Full code  Family Communication: No family at bedside   Consultants: PCCM General surgery  Procedures:     Objective    Physical Examination:  General-appears in no acute distress Heart-S1-S2, regular, no murmur auscultated Lungs-clear to auscultation bilaterally, no wheezing or crackles auscultated Abdomen-soft, nontender, no organomegaly Extremities-no edema in the lower extremities Neuro-alert, oriented x3, no focal deficit noted  Status is: Inpatient  Dispo: The patient is from: Home              Anticipated d/c is to: Home              Anticipated d/c date is: 01/07/2021              Patient currently not stable for discharge  Barrier to discharge-ongoing postop care  COVID-19 Labs  No results for input(s): DDIMER, FERRITIN, LDH, CRP in the last 72 hours.  Lab Results  Component Value Date   [place] NEGATIVE 01/01/2021   [place] NEGATIVE 05/26/2020   [place] NEGATIVE 11/20/2019   [place] NEGATIVE 11/08/2019            Recent Results (from the past 240 hour(s))  Blood Culture (routine x 2)     Status: None (Preliminary result)   Collection Time: 01/01/21  1:40 PM   Specimen: BLOOD  Result Value Ref Range Status   Specimen Description BLOOD RIGHT ANTECUBITAL  Final   Special Requests   Final    BOTTLES DRAWN AEROBIC AND ANAEROBIC Blood Culture adequate volume   Culture   Final    NO GROWTH 3 DAYS Performed at [facility], [place] [address], [place], [place] [address]    Report Status PENDING  Incomplete  Blood Culture (routine x 2)     Status: None (Preliminary result)   Collection Time: 01/01/21  2:00 PM   Specimen: BLOOD LEFT FOREARM  Result Value Ref Range Status   Specimen Description BLOOD LEFT FOREARM  Final   Special Requests   Final    BOTTLES DRAWN AEROBIC AND ANAEROBIC Blood Culture adequate volume   Culture   Final    NO GROWTH 3 DAYS Performed at [facility], [place] [address] [place], [place] [address]    Report Status PENDING  Incomplete  Resp Panel by RT-PCR (Flu A&B, Covid)     Status: None   Collection Time: 01/01/21  5:53 PM   Specimen: Nasopharyngeal(NP) swabs in vial transport medium  Result Value Ref Range Status   SARS Coronavirus 2 by RT PCR NEGATIVE NEGATIVE Final    Comment: (NOTE) SARS-CoV-2 target nucleic acids are NOT DETECTED.  The SARS-CoV-2 RNA is generally detectable in upper respiratory specimens during the acute phase of infection. The lowest concentration of SARS-CoV-2 viral copies this assay can detect is 138 copies/mL. A negative result does not preclude SARS-Cov-2 infection and should not be used as the sole basis for treatment or other patient management decisions. A negative result may occur with  improper specimen collection/handling, submission of specimen other than nasopharyngeal swab, presence of viral mutation(s) within the areas targeted by this assay, and inadequate number of viral copies(<138 copies/mL). A negative result must be combined with clinical observations, patient history, and epidemiological information. The expected result is Negative.  Fact Sheet for Patients:  EntrepreneurPulse.com.au  Fact Sheet for Healthcare Providers:  IncredibleEmployment.be  This test is no t yet approved or cleared by the [place] FDA and  has been authorized for detection and/or diagnosis of SARS-CoV-2 by FDA under an Emergency Use Authorization (EUA). This EUA will remain  in effect (meaning this test can be used) for the duration of the COVID-19 declaration under Section 564(b)(1) of the Act, 21 U.S.C.section 360bbb-3(b)(1), unless the authorization is terminated  or revoked sooner.       Influenza A by PCR NEGATIVE NEGATIVE Final   Influenza B by PCR NEGATIVE NEGATIVE Final    Comment: (NOTE) The Xpert Xpress SARS-CoV-2/FLU/RSV plus assay is intended  as an aid in the diagnosis of influenza from Nasopharyngeal swab specimens and should not be used as a sole basis for treatment. Nasal washings and aspirates are unacceptable for Xpert Xpress SARS-CoV-2/FLU/RSV testing.  Fact Sheet for Patients: EntrepreneurPulse.com.au  Fact Sheet for Healthcare Providers: IncredibleEmployment.be  This test is not yet approved or cleared by the [place] FDA and has been authorized for detection and/or diagnosis of SARS-CoV-2 by FDA under an Emergency Use Authorization (EUA). This EUA will remain in effect (meaning this test can be used) for the duration of the COVID-19 declaration under Section 564(b)(1) of the Act, 21 U.S.C. section 360bbb-3(b)(1), unless the authorization is terminated or revoked.  Performed at [facility], [place] [address], [place], [place] [address]   Aerobic Culture w Gram Stain (superficial specimen)     Status: None (Preliminary result)   Collection Time: 01/01/21  9:20 PM   Specimen: Abdomen; Abscess  Result Value Ref Range Status   Specimen Description FLUID ABDOMEN  Final   Special Requests NONE  Final   Gram Stain   Final    NO SQUAMOUS EPITHELIAL CELLS  SEEN FEW WBC SEEN FEW GRAM POSITIVE RODS FEW GRAM POSITIVE COCCI MODERATE GRAM NEGATIVE RODS    Culture   Final    ABUNDANT ESCHERICHIA COLI FEW ENTEROCOCCUS DURANS SUSCEPTIBILITIES TO FOLLOW Performed at [facility], [place] [address], [place], [place] [address]    Report Status PENDING  Incomplete   Organism ID, Bacteria ESCHERICHIA COLI  Final      Susceptibility   Escherichia coli - MIC*    AMPICILLIN 4 SENSITIVE Sensitive     CEFAZOLIN <=4 SENSITIVE Sensitive     CEFEPIME <=0.12 SENSITIVE Sensitive     CEFTAZIDIME <=1 SENSITIVE Sensitive     CEFTRIAXONE <=0.25 SENSITIVE Sensitive     CIPROFLOXACIN <=0.25 SENSITIVE Sensitive     GENTAMICIN <=1 SENSITIVE Sensitive     IMIPENEM <=0.25 SENSITIVE  Sensitive     TRIMETH/SULFA <=20 SENSITIVE Sensitive     AMPICILLIN/SULBACTAM <=2 SENSITIVE Sensitive     PIP/TAZO <=4 SENSITIVE Sensitive     * ABUNDANT ESCHERICHIA COLI  MRSA Next Gen by PCR, Nasal     Status: None   Collection Time: 01/01/21 11:39 PM   Specimen: Nasal Mucosa; Nasal Swab  Result Value Ref Range Status   MRSA by PCR Next Gen NOT DETECTED NOT DETECTED Final    Comment: (NOTE) The GeneXpert MRSA Assay (FDA approved for NASAL specimens only), is one component of a comprehensive MRSA colonization surveillance program. It is not intended to diagnose MRSA infection nor to guide or monitor treatment for MRSA infections. Test performance is not FDA approved in patients less than 68 years old. Performed at [facility], [place] [address] [place], [place] [address]   Urine Culture     Status: None   Collection Time: 01/02/21  3:21 AM   Specimen: In/Out Cath Urine  Result Value Ref Range Status   Specimen Description IN/OUT CATH URINE  Final   Special Requests NONE  Final   Culture   Final    NO GROWTH Performed at [facility], [place] [address], [place], [place] [address]    Report Status 01/03/2021 FINAL  Final    Oswald Hillock   [facility] If 7PM-7AM, please contact night-coverage at [email or web address], Office  [phone]   01/04/2021, 6:21 PM  LOS: 3 days

## 2021-01-05 ENCOUNTER — Inpatient Hospital Stay (HOSPITAL_COMMUNITY): Payer: Medicare HMO

## 2021-01-05 DIAGNOSIS — I5022 Chronic systolic (congestive) heart failure: Secondary | ICD-10-CM | POA: Diagnosis not present

## 2021-01-05 DIAGNOSIS — K3532 Acute appendicitis with perforation and localized peritonitis, without abscess: Secondary | ICD-10-CM | POA: Diagnosis not present

## 2021-01-05 DIAGNOSIS — N1831 Chronic kidney disease, stage 3a: Secondary | ICD-10-CM | POA: Diagnosis not present

## 2021-01-05 DIAGNOSIS — I10 Essential (primary) hypertension: Secondary | ICD-10-CM | POA: Diagnosis not present

## 2021-01-05 DIAGNOSIS — E440 Moderate protein-calorie malnutrition: Secondary | ICD-10-CM | POA: Insufficient documentation

## 2021-01-05 LAB — GLUCOSE, CAPILLARY
Glucose-Capillary: 106 mg/dL — ABNORMAL HIGH (ref 70–99)
Glucose-Capillary: 121 mg/dL — ABNORMAL HIGH (ref 70–99)
Glucose-Capillary: 123 mg/dL — ABNORMAL HIGH (ref 70–99)
Glucose-Capillary: 165 mg/dL — ABNORMAL HIGH (ref 70–99)
Glucose-Capillary: 94 mg/dL (ref 70–99)
Glucose-Capillary: 97 mg/dL (ref 70–99)

## 2021-01-05 LAB — CBC
HCT: 30.5 % — ABNORMAL LOW (ref 39.0–52.0)
Hemoglobin: 9.9 g/dL — ABNORMAL LOW (ref 13.0–17.0)
MCH: 31.1 pg (ref 26.0–34.0)
MCHC: 32.5 g/dL (ref 30.0–36.0)
MCV: 95.9 fL (ref 80.0–100.0)
Platelets: 221 10*3/uL (ref 150–400)
RBC: 3.18 MIL/uL — ABNORMAL LOW (ref 4.22–5.81)
RDW: 15.5 % (ref 11.5–15.5)
WBC: 8.9 10*3/uL (ref 4.0–10.5)
nRBC: 0 % (ref 0.0–0.2)

## 2021-01-05 LAB — AEROBIC CULTURE W GRAM STAIN (SUPERFICIAL SPECIMEN): Gram Stain: NONE SEEN

## 2021-01-05 LAB — BASIC METABOLIC PANEL
Anion gap: 10 (ref 5–15)
BUN: 23 mg/dL (ref 8–23)
CO2: 25 mmol/L (ref 22–32)
Calcium: 9.1 mg/dL (ref 8.9–10.3)
Chloride: 104 mmol/L (ref 98–111)
Creatinine, Ser: 1.43 mg/dL — ABNORMAL HIGH (ref 0.61–1.24)
GFR, Estimated: 55 mL/min — ABNORMAL LOW (ref >60)
Glucose, Bld: 119 mg/dL — ABNORMAL HIGH (ref 70–99)
Potassium: 3.8 mmol/L (ref 3.5–5.1)
Sodium: 139 mmol/L (ref 135–145)

## 2021-01-05 MED ORDER — BOOST / RESOURCE BREEZE PO LIQD CUSTOM
1.0000 | Freq: Three times a day (TID) | ORAL | Status: DC
  Administered 2021-01-05: 1 via ORAL

## 2021-01-05 MED ORDER — SODIUM CHLORIDE 0.9 % IV BOLUS
500.0000 mL | Freq: Once | INTRAVENOUS | Status: AC
  Administered 2021-01-05: 500 mL via INTRAVENOUS

## 2021-01-05 MED ORDER — HYDROMORPHONE HCL 1 MG/ML IJ SOLN
1.0000 mg | INTRAMUSCULAR | Status: DC | PRN
  Filled 2021-01-05: qty 1

## 2021-01-05 MED ORDER — ACETAMINOPHEN 650 MG RE SUPP: 650.0000 mg | Freq: Four times a day (QID) | RECTAL | Status: DC

## 2021-01-05 MED ORDER — HYDROMORPHONE HCL 1 MG/ML IJ SOLN: 1.0000 mg | INTRAMUSCULAR | Status: DC | PRN

## 2021-01-05 MED ORDER — ACETAMINOPHEN 325 MG PO TABS: 650.0000 mg | ORAL_TABLET | Freq: Four times a day (QID) | ORAL | Status: DC

## 2021-01-05 MED ORDER — ACETAMINOPHEN 325 MG PO TABS
650.0000 mg | ORAL_TABLET | Freq: Four times a day (QID) | ORAL | Status: DC
  Filled 2021-01-05: qty 2

## 2021-01-05 MED ORDER — IPRATROPIUM-ALBUTEROL 0.5-2.5 (3) MG/3ML IN SOLN
3.0000 mL | Freq: Three times a day (TID) | RESPIRATORY_TRACT | Status: DC
  Administered 2021-01-05 – 2021-01-12 (×24): 3 mL via RESPIRATORY_TRACT
  Filled 2021-01-05 (×22): qty 3

## 2021-01-05 MED ORDER — SODIUM CHLORIDE 0.9 % IV SOLN
3.0000 g | Freq: Four times a day (QID) | INTRAVENOUS | Status: AC
  Administered 2021-01-05 – 2021-01-06 (×6): 3 g via INTRAVENOUS
  Filled 2021-01-05 (×6): qty 8

## 2021-01-05 MED ORDER — METHOCARBAMOL 1000 MG/10ML IJ SOLN
1000.0000 mg | Freq: Three times a day (TID) | INTRAVENOUS | Status: DC
  Administered 2021-01-05 – 2021-01-06 (×4): 1000 mg via INTRAVENOUS
  Filled 2021-01-05 (×6): qty 10
  Filled 2021-01-05 (×3): qty 1000
  Filled 2021-01-05 (×2): qty 10

## 2021-01-05 MED ORDER — ONDANSETRON HCL 4 MG/2ML IJ SOLN
4.0000 mg | Freq: Four times a day (QID) | INTRAMUSCULAR | Status: DC | PRN
  Administered 2021-01-05 – 2021-01-28 (×6): 4 mg via INTRAVENOUS
  Filled 2021-01-05 (×7): qty 2

## 2021-01-05 MED ORDER — IOHEXOL 9 MG/ML PO SOLN
ORAL | Status: AC
  Filled 2021-01-05: qty 1000

## 2021-01-05 MED ORDER — OXYCODONE HCL 5 MG PO TABS
5.0000 mg | ORAL_TABLET | ORAL | Status: DC | PRN
  Administered 2021-01-05: 10 mg via ORAL
  Filled 2021-01-05: qty 2

## 2021-01-05 MED ORDER — HYDROMORPHONE HCL 1 MG/ML IJ SOLN
1.0000 mg | INTRAMUSCULAR | Status: DC | PRN
  Administered 2021-01-05: 1 mg via INTRAVENOUS
  Filled 2021-01-05: qty 1

## 2021-01-05 NOTE — Progress Notes (Signed)
[facility]  PROGRESS NOTE  RIOTT FULLER [id number] DOB: 01/07/1956 DOA: 01/01/2021 PCP: [name], NP   Brief HPI:   65 year old male with past medical history of CHF, CKD stage IIIa, COPD, TIA, diabetes mellitus type 2, hypertension, gout, hyperlipidemia, CAD, nonobstructive presented to ED on 10/9/ -and abdominal abscess and abdominal abscess.  He is s/p diagnostic laparoscopy, exploratory mucosectomy, dileocecectomy, drainage of intra-abdominal abscess. He was under the care of intensivist, on mechanical ventilation in ICU/ Currently he is extubated TR[facility] resume service on 01/04/2021.   Subjective   Patient seen and examined, he pulled out NG tube last night.  Started on clear liquid diet by general surgery.   Assessment/Plan:     Peritonitis/sepsis with shock -Patient presented with peritonitis secondary to perforated appendix and abdominal abscess -S/p diagnostic laparoscopy, laparotomy, ileocecectomy, drainage of intra-abdominal abscess  -He was on IV Zosyn which has been discontinued -Patient is off vasopressors -He pulled out NG tube last night -Started on clear liquid diet  Acute hypoxemic respiratory failure -Secondary to intra-abdominal sepsis -Resolved  Acute kidney injury on CKD stage III -Secondary to ATN -Today creatinine is 1.43 -Renal function is slowly improving -Avoid nephrotoxins  Diabetes mellitus type 2 with hyperglycemia -Continue sliding scale insulin with NovoLog -CBG well controlled  History of nonischemic cardiomyopathy -Nonobstructive CAD  History of hypertension -Patient had 12 beat run of NSVT today -Home Coreg was on hold due to surgery as above -Started on metoprolol 2.5 mg IV every 8 hours  NSVT -Likely from stopping Coreg -Patient magnesium level is 2.2, potassium 3.8 -Goal to keep K greater than 4, mag greater than 2.0 -Follow electrolytes in a.m.  History of COPD -Continue DuoNebs as needed    Scheduled  medications:    chlorhexidine  15 mL Mouth Rinse BID   Chlorhexidine Gluconate Cloth  6 each Topical Daily   feeding supplement  1 Container Oral TID BM   heparin injection (subcutaneous)  5,000 Units Subcutaneous Q8H   insulin aspart  0-15 Units Subcutaneous Q4H   ipratropium-albuterol  3 mL Nebulization TID   mouth rinse  15 mL Mouth Rinse q12n4p   metoprolol tartrate  2.5 mg Intravenous Q8H   pantoprazole (PROTONIX) IV  40 mg Intravenous Q0600   sodium chloride flush  3 mL Intravenous Q12H   thiamine injection  100 mg Intravenous Daily     Data Reviewed:   CBG:  Recent Labs  Lab 01/04/21 1925 01/04/21 2333 01/05/21 0324 01/05/21 0749 01/05/21 1103  GLUCAP 118* 111* 106* 94 97    SpO2: 95 % O2 Flow Rate (L/min): 5 L/min FiO2 (%): 92 %    Vitals:   01/05/21 0800 01/05/21 1000 01/05/21 1100 01/05/21 1105  BP: 132/76 128/77 127/82   Pulse: 93 89 88   Resp: '16 18 19   '$ Temp:    98.6 F (37 C)  TempSrc:    Oral  SpO2: (!) 87% 95% 95%   Weight:      Height:         Intake/Output Summary (Last 24 hours) at 01/05/2021 1317 Last data filed at 01/05/2021 1100 Gross per 24 hour  Intake 857.96 ml  Output 925 ml  Net -67.04 ml    10/11 1901 - 10/13 0700 In: 1162.1 [I.V.:189.5] Out: 1560 [Urine:1400; Drains:135]  Filed Weights   01/03/21 0321 01/04/21 0352 01/05/21 0328  Weight: 86.5 kg 84.4 kg 85.9 kg    Data Reviewed: Basic Metabolic Panel: Recent Labs  Lab 01/01/21  1023 01/01/21 2218 01/02/21 0321 01/02/21 0428 01/03/21 0304 01/04/21 0140 01/04/21 0420 01/04/21 0632 01/05/21 0211  NA 134*   < > 131* 134* 135 137  --   --  139  K 5.2*   < > 4.7 4.6 3.7 3.8  --   --  3.8  CL 98  --  101  --  99 103  --   --  104  CO2 19*  --  19*  --  25 24  --   --  25  GLUCOSE 279*  --  266*  --  136* 130*  --   --  119*  BUN 20  --  23  --  25* 24*  --   --  23  CREATININE 1.51*  --  2.01*  --  2.07* 1.90*  --   --  1.43*  CALCIUM 10.4*  --  8.3*  --  8.6*  9.0  --   --  9.1  MG  --   --  1.6*  --   --   --  2.2  --   --   PHOS  --   --  3.7  --   --   --   --  3.0  --    < > = values in this interval not displayed.   Liver Function Tests: Recent Labs  Lab 01/01/21 1023 01/02/21 0321  AST 24 19  ALT 19 15  ALKPHOS 103 52  BILITOT 1.2 2.5*  PROT 8.5* 5.7*  ALBUMIN 4.5 2.7*   Recent Labs  Lab 01/01/21 1023  LIPASE 31   No results for input(s): AMMONIA in the last 168 hours. CBC: Recent Labs  Lab 01/01/21 1023 01/01/21 2218 01/02/21 0025 01/02/21 0321 01/02/21 0428 01/04/21 0632 01/05/21 0211  WBC 9.1  --   --  7.0  --  9.0 8.9  NEUTROABS 7.2  --   --   --   --   --   --   HGB 16.7   < > 11.9* 11.8* 12.2* 10.4* 9.9*  HCT 52.8*   < > 35.0* 36.2* 36.0* 31.6* 30.5*  MCV 100.4*  --   --  96.3  --  96.0 95.9  PLT 209  --   --  200  --  209 221   < > = values in this interval not displayed.   Cardiac Enzymes: No results for input(s): CKTOTAL, CKMB, CKMBINDEX, TROPONINI in the last 168 hours. BNP (last 3 results) No results for input(s): BNP in the last 8760 hours.  ProBNP (last 3 results) Recent Labs    06/02/20 1431  PROBNP 61.0    CBG: Recent Labs  Lab 01/04/21 1925 01/04/21 2333 01/05/21 0324 01/05/21 0749 01/05/21 1103  GLUCAP 118* 111* 106* 94 97       Radiology Reports  DG Abd 1 View  Result Date: 01/04/2021 CLINICAL DATA:  Ileus and abdominal pain EXAM: ABDOMEN - 1 VIEW COMPARISON:  01/03/2021 FINDINGS: Enteric tube is present with tip in the right upper quadrant consistent with location in the distal stomach or possibly proximal duodenum. Nondilated gas-filled small bowel with scattered gas in the colon. Changes likely representing ileus. No radiopaque stones. Catheter in the lower abdomen may represent a drainage catheter or possibly jejunal catheter. No change in position. Degenerative changes in the spine. Previous right hip arthroplasty. Atelectasis in the lung bases. IMPRESSION: Appliances  appear in satisfactory position. Gas-filled nondilated small bowel, likely ileus. No definite evidence  of obstruction. Similar appearance to previous study. Electronically Signed   By: Lucienne Capers M.D.   On: 01/04/2021 00:22   DG Abd 1 View  Result Date: 01/03/2021 CLINICAL DATA:  Nasogastric tube placement. EXAM: ABDOMEN - 1 VIEW COMPARISON:  January 01, 2021 FINDINGS: A nasogastric tube is seen with its distal tip overlying the expected region of the duodenal bulb. A few air-filled loops small bowel within the mid left abdomen appear to approach the upper limit of normal caliber. No radio-opaque calculi or other significant radiographic abnormality are seen. IMPRESSION: Nasogastric tube positioning, as described above. Electronically Signed   By: Virgina Norfolk M.D.   On: 01/03/2021 15:45   DG CHEST PORT 1 VIEW  Result Date: 01/04/2021 CLINICAL DATA:  Cough, ileus, and abdominal pain EXAM: PORTABLE CHEST 1 VIEW COMPARISON:  01/01/2021 FINDINGS: Enteric tube is present with tip in the right upper quadrant consistent with location in the distal stomach or proximal duodenum. Shallow inspiration with linear atelectasis in the lung bases. No airspace disease or consolidation. Normal heart size. No pneumothorax. Postoperative changes in the right shoulder. IMPRESSION: Shallow inspiration with atelectasis in the lung bases. Electronically Signed   By: Lucienne Capers M.D.   On: 01/04/2021 00:23       Antibiotics: Anti-infectives (From admission, onward)    Start     Dose/Rate Route Frequency Ordered Stop   01/01/21 2200  piperacillin-tazobactam (ZOSYN) IVPB 3.375 g        3.375 g 12.5 mL/hr over 240 Minutes Intravenous Every 8 hours 01/01/21 1841 01/04/21 2359   01/01/21 2100  piperacillin-tazobactam (ZOSYN) IVPB 3.375 g  Status:  Discontinued        3.375 g 100 mL/hr over 30 Minutes Intravenous Every 8 hours 01/01/21 1836 01/01/21 1841   01/01/21 1345  piperacillin-tazobactam (ZOSYN)  IVPB 3.375 g        3.375 g 100 mL/hr over 30 Minutes Intravenous  Once 01/01/21 1337 01/01/21 1430         DVT prophylaxis: Heparin  Code Status: Full code  Family Communication: No family at bedside   Consultants: PCCM General surgery  Procedures:     Objective    Physical Examination:  General-appears in no acute distress Heart-S1-S2, regular, no murmur auscultated Lungs-clear to auscultation bilaterally, no wheezing or crackles auscultated Abdomen-soft, nontender, no organomegaly Extremities-no edema in the lower extremities Neuro-alert, oriented x3, no focal deficit noted  Status is: Inpatient  Dispo: The patient is from: Home              Anticipated d/c is to: Home              Anticipated d/c date is: 01/07/2021              Patient currently not stable for discharge  Barrier to discharge-ongoing postop care  COVID-19 Labs  No results for input(s): DDIMER, FERRITIN, LDH, CRP in the last 72 hours.  Lab Results  Component Value Date   [place] NEGATIVE 01/01/2021   [place] NEGATIVE 05/26/2020   [place] NEGATIVE 11/20/2019   [place] NEGATIVE 11/08/2019            Recent Results (from the past 240 hour(s))  Blood Culture (routine x 2)     Status: None (Preliminary result)   Collection Time: 01/01/21  1:40 PM   Specimen: BLOOD  Result Value Ref Range Status   Specimen Description BLOOD RIGHT ANTECUBITAL  Final   Special Requests   Final  BOTTLES DRAWN AEROBIC AND ANAEROBIC Blood Culture adequate volume   Culture   Final    NO GROWTH 4 DAYS Performed at [facility], [place] [address], [place], [place] [address]    Report Status PENDING  Incomplete  Blood Culture (routine x 2)     Status: None (Preliminary result)   Collection Time: 01/01/21  2:00 PM   Specimen: BLOOD LEFT FOREARM  Result Value Ref Range Status   Specimen Description BLOOD LEFT FOREARM  Final   Special Requests   Final    BOTTLES DRAWN AEROBIC  AND ANAEROBIC Blood Culture adequate volume   Culture   Final    NO GROWTH 4 DAYS Performed at [facility], [place] [address], [place], [place] [address]    Report Status PENDING  Incomplete  Resp Panel by RT-PCR (Flu A&B, Covid)     Status: None   Collection Time: 01/01/21  5:53 PM   Specimen: Nasopharyngeal(NP) swabs in vial transport medium  Result Value Ref Range Status   SARS Coronavirus 2 by RT PCR NEGATIVE NEGATIVE Final    Comment: (NOTE) SARS-CoV-2 target nucleic acids are NOT DETECTED.  The SARS-CoV-2 RNA is generally detectable in upper respiratory specimens during the acute phase of infection. The lowest concentration of SARS-CoV-2 viral copies this assay can detect is 138 copies/mL. A negative result does not preclude SARS-Cov-2 infection and should not be used as the sole basis for treatment or other patient management decisions. A negative result may occur with  improper specimen collection/handling, submission of specimen other than nasopharyngeal swab, presence of viral mutation(s) within the areas targeted by this assay, and inadequate number of viral copies(<138 copies/mL). A negative result must be combined with clinical observations, patient history, and epidemiological information. The expected result is Negative.  Fact Sheet for Patients:  EntrepreneurPulse.com.au  Fact Sheet for Healthcare Providers:  IncredibleEmployment.be  This test is no t yet approved or cleared by the [place] FDA and  has been authorized for detection and/or diagnosis of SARS-CoV-2 by FDA under an Emergency Use Authorization (EUA). This EUA will remain  in effect (meaning this test can be used) for the duration of the COVID-19 declaration under Section 564(b)(1) of the Act, 21 U.S.C.section 360bbb-3(b)(1), unless the authorization is terminated  or revoked sooner.       Influenza A by PCR NEGATIVE NEGATIVE Final   Influenza B by PCR  NEGATIVE NEGATIVE Final    Comment: (NOTE) The Xpert Xpress SARS-CoV-2/FLU/RSV plus assay is intended as an aid in the diagnosis of influenza from Nasopharyngeal swab specimens and should not be used as a sole basis for treatment. Nasal washings and aspirates are unacceptable for Xpert Xpress SARS-CoV-2/FLU/RSV testing.  Fact Sheet for Patients: EntrepreneurPulse.com.au  Fact Sheet for Healthcare Providers: IncredibleEmployment.be  This test is not yet approved or cleared by the [place] FDA and has been authorized for detection and/or diagnosis of SARS-CoV-2 by FDA under an Emergency Use Authorization (EUA). This EUA will remain in effect (meaning this test can be used) for the duration of the COVID-19 declaration under Section 564(b)(1) of the Act, 21 U.S.C. section 360bbb-3(b)(1), unless the authorization is terminated or revoked.  Performed at [facility], [place] [address] [place], [place] [address]   Aerobic Culture w Gram Stain (superficial specimen)     Status: None   Collection Time: 01/01/21  9:20 PM   Specimen: Abdomen; Abscess  Result Value Ref Range Status   Specimen Description FLUID  ABDOMEN  Final   Special Requests NONE  Final   Gram Stain   Final    NO SQUAMOUS EPITHELIAL CELLS SEEN FEW WBC SEEN FEW GRAM POSITIVE RODS FEW GRAM POSITIVE COCCI MODERATE GRAM NEGATIVE RODS Performed at [facility], [place] [address], [place], [place] [address]    Culture   Final    ABUNDANT ESCHERICHIA COLI FEW ENTEROCOCCUS DURANS    Report Status 01/05/2021 FINAL  Final   Organism ID, Bacteria ESCHERICHIA COLI  Final   Organism ID, Bacteria ENTEROCOCCUS DURANS  Final      Susceptibility   Escherichia coli - MIC*    AMPICILLIN 4 SENSITIVE Sensitive     CEFAZOLIN <=4 SENSITIVE Sensitive     CEFEPIME <=0.12 SENSITIVE Sensitive     CEFTAZIDIME <=1 SENSITIVE Sensitive     CEFTRIAXONE <=0.25 SENSITIVE Sensitive      CIPROFLOXACIN <=0.25 SENSITIVE Sensitive     GENTAMICIN <=1 SENSITIVE Sensitive     IMIPENEM <=0.25 SENSITIVE Sensitive     TRIMETH/SULFA <=20 SENSITIVE Sensitive     AMPICILLIN/SULBACTAM <=2 SENSITIVE Sensitive     PIP/TAZO <=4 SENSITIVE Sensitive     * ABUNDANT ESCHERICHIA COLI   Enterococcus durans - MIC*    AMPICILLIN <=2 SENSITIVE Sensitive     VANCOMYCIN <=0.5 SENSITIVE Sensitive     GENTAMICIN SYNERGY SENSITIVE Sensitive     * FEW ENTEROCOCCUS DURANS  MRSA Next Gen by PCR, Nasal     Status: None   Collection Time: 01/01/21 11:39 PM   Specimen: Nasal Mucosa; Nasal Swab  Result Value Ref Range Status   MRSA by PCR Next Gen NOT DETECTED NOT DETECTED Final    Comment: (NOTE) The GeneXpert MRSA Assay (FDA approved for NASAL specimens only), is one component of a comprehensive MRSA colonization surveillance program. It is not intended to diagnose MRSA infection nor to guide or monitor treatment for MRSA infections. Test performance is not FDA approved in patients less than 73 years old. Performed at [facility], [place] [address] [place], [place] [address]   Urine Culture     Status: None   Collection Time: 01/02/21  3:21 AM   Specimen: In/Out Cath Urine  Result Value Ref Range Status   Specimen Description IN/OUT CATH URINE  Final   Special Requests NONE  Final   Culture   Final    NO GROWTH Performed at [facility], [place] [address] [place], [place] [address]    Report Status 01/03/2021 FINAL  Final    Oswald Hillock   [facility] If 7PM-7AM, please contact night-coverage at [email or web address], Office  [phone]   01/05/2021, 1:17 PM  LOS: 4 days

## 2021-01-05 NOTE — Progress Notes (Signed)
Overnight event  Patient complaining of severe abdominal pain not relieved with morphine.  Medication changed to Dilaudid PRN.  I discussed the case with Dr. Nikki Dom with general surgery -stat CT abdomen pelvis with oral contrast ordered to rule out abscess.  If patient is not able to tolerate p.o. contrast, will need NG tube.

## 2021-01-05 NOTE — Consult Note (Signed)
   [facility] CM Inpatient Consult   01/05/2021  Travis [name] 02-26-1956 [id number]  [place] [facility] [ACO] Patient: [name]  Primary Care Provider:  [name], NP at [place] with [facility]  Patient was screened for hospitalization as an active member in the [place] Management program with RN Embedded Care Coordinator.  Plan: Notification is to be sent to the [place] Management and made aware of hospitalization and ongoing needs.  Please contact for further questions,  Natividad Brood, RN BSN [facility] Liaison  [phone] business mobile phone Toll free office [phone]  Fax number: [phone] [email or web address]

## 2021-01-05 NOTE — Progress Notes (Signed)
Progress Note  4 Days Post-Op  Subjective: Patient is A&Ox4 this AM but seems a little confused at times. He reports he is having abdominal pain and asking for morphine. He denies nausea or vomiting. He reports he is passing some flatus. He seems very mistrustful of staff and repeatedly requests to speak with MD specifically. He reports he wants to be able to go home and we discussed the milestones he needs to meet from a surgical standpoint to be ready to go home. He is asking why we can't close incision and we discussed why skin is left open and allowed to heal by secondary intention.   Objective: Vital signs in last 24 hours: Temp:  [98.2 F (36.8 C)-98.6 F (37 C)] 98.2 F (36.8 C) (10/13 0751) Pulse Rate:  [86-102] 91 (10/13 0738) Resp:  [15-20] 19 (10/13 0738) BP: (70-127)/(53-93) 119/75 (10/13 0738) SpO2:  [92 %-96 %] 93 % (10/13 0738) Weight:  [85.9 kg] 85.9 kg (10/13 0328) Last BM Date:  (PTA)  Intake/Output from previous day: 10/12 0701 - 10/13 0700 In: 953.6 [I.V.:100; IV Piggyback:853.7] Out: 995 [Urine:875; Emesis/NG output:25; Drains:95] Intake/Output this shift: No intake/output data recorded.  PE: General: WD, elderly appearing male who is laying in bed and in NAD Heart: RRR Lungs: wheezing bilaterally, normal effort Abd: soft, appropriately ttp, moderately distended, +BS, drain with SS fluid, midline dressing in place MS: all 4 extremities are symmetrical with no cyanosis, clubbing, or edema. Skin: warm and dry with no masses, lesions, or rashes     Lab Results:  Recent Labs    01/04/21 0632 01/05/21 0211  WBC 9.0 8.9  HGB 10.4* 9.9*  HCT 31.6* 30.5*  PLT 209 221   BMET Recent Labs    01/04/21 0140 01/05/21 0211  NA 137 139  K 3.8 3.8  CL 103 104  CO2 24 25  GLUCOSE 130* 119*  BUN 24* 23  CREATININE 1.90* 1.43*  CALCIUM 9.0 9.1   PT/INR No results for input(s): LABPROT, INR in the last 72 hours. CMP     Component Value Date/Time    NA 139 01/05/2021 0211   NA 138 06/20/2020 1034   NA 141 04/20/2014 1019   K 3.8 01/05/2021 0211   K 3.7 04/20/2014 1019   CL 104 01/05/2021 0211   CL 106 04/20/2014 1019   CO2 25 01/05/2021 0211   CO2 30 04/20/2014 1019   GLUCOSE 119 (H) 01/05/2021 0211   GLUCOSE 102 (H) 04/20/2014 1019   BUN 23 01/05/2021 0211   BUN 18 06/20/2020 1034   BUN 14 04/20/2014 1019   CREATININE 1.43 (H) 01/05/2021 0211   CREATININE 1.18 04/20/2014 1019   CALCIUM 9.1 01/05/2021 0211   CALCIUM 9.3 04/20/2014 1019   PROT 5.7 (L) 01/02/2021 0321   PROT 6.5 06/18/2017 1425   PROT 7.4 04/20/2014 1019   ALBUMIN 2.7 (L) 01/02/2021 0321   ALBUMIN 4.0 06/18/2017 1425   ALBUMIN 3.4 04/20/2014 1019   AST 19 01/02/2021 0321   AST 21 04/20/2014 1019   ALT 15 01/02/2021 0321   ALT 20 04/20/2014 1019   ALKPHOS 52 01/02/2021 0321   ALKPHOS 97 04/20/2014 1019   BILITOT 2.5 (H) 01/02/2021 0321   BILITOT 0.4 06/18/2017 1425   BILITOT 0.6 04/20/2014 1019   GFRNONAA 55 (L) 01/05/2021 0211   GFRNONAA >60 04/20/2014 1019   GFRNONAA >60 11/19/2012 1107   GFRAA 53 (L) 01/13/2020 1410   GFRAA >60 04/20/2014 1019   GFRAA >60  11/19/2012 1107   Lipase     Component Value Date/Time   LIPASE 31 01/01/2021 1023   LIPASE 112 04/20/2014 1019       Studies/Results: DG Abd 1 View  Result Date: 01/04/2021 CLINICAL DATA:  Ileus and abdominal pain EXAM: ABDOMEN - 1 VIEW COMPARISON:  01/03/2021 FINDINGS: Enteric tube is present with tip in the right upper quadrant consistent with location in the distal stomach or possibly proximal duodenum. Nondilated gas-filled small bowel with scattered gas in the colon. Changes likely representing ileus. No radiopaque stones. Catheter in the lower abdomen may represent a drainage catheter or possibly jejunal catheter. No change in position. Degenerative changes in the spine. Previous right hip arthroplasty. Atelectasis in the lung bases. IMPRESSION: Appliances appear in satisfactory  position. Gas-filled nondilated small bowel, likely ileus. No definite evidence of obstruction. Similar appearance to previous study. Electronically Signed   By: Lucienne Capers M.D.   On: 01/04/2021 00:22   DG Abd 1 View  Result Date: 01/03/2021 CLINICAL DATA:  Nasogastric tube placement. EXAM: ABDOMEN - 1 VIEW COMPARISON:  January 01, 2021 FINDINGS: A nasogastric tube is seen with its distal tip overlying the expected region of the duodenal bulb. A few air-filled loops small bowel within the mid left abdomen appear to approach the upper limit of normal caliber. No radio-opaque calculi or other significant radiographic abnormality are seen. IMPRESSION: Nasogastric tube positioning, as described above. Electronically Signed   By: Virgina Norfolk M.D.   On: 01/03/2021 15:45   DG CHEST PORT 1 VIEW  Result Date: 01/04/2021 CLINICAL DATA:  Cough, ileus, and abdominal pain EXAM: PORTABLE CHEST 1 VIEW COMPARISON:  01/01/2021 FINDINGS: Enteric tube is present with tip in the right upper quadrant consistent with location in the distal stomach or proximal duodenum. Shallow inspiration with linear atelectasis in the lung bases. No airspace disease or consolidation. Normal heart size. No pneumothorax. Postoperative changes in the right shoulder. IMPRESSION: Shallow inspiration with atelectasis in the lung bases. Electronically Signed   By: Lucienne Capers M.D.   On: 01/04/2021 00:23   [place] EKG SITE RITE  Result Date: 01/03/2021 If Site Rite image not attached, placement could not be confirmed due to current cardiac rhythm.   Anti-infectives: Anti-infectives (From admission, onward)    Start     Dose/Rate Route Frequency Ordered Stop   01/01/21 2200  piperacillin-tazobactam (ZOSYN) IVPB 3.375 g        3.375 g 12.5 mL/hr over 240 Minutes Intravenous Every 8 hours 01/01/21 1841 01/04/21 2359   01/01/21 2100  piperacillin-tazobactam (ZOSYN) IVPB 3.375 g  Status:  Discontinued        3.375 g 100 mL/hr  over 30 Minutes Intravenous Every 8 hours 01/01/21 1836 01/01/21 1841   01/01/21 1345  piperacillin-tazobactam (ZOSYN) IVPB 3.375 g        3.375 g 100 mL/hr over 30 Minutes Intravenous  Once 01/01/21 1337 01/01/21 1430        Assessment/Plan Perforated appendicitis with abscess POD4 s/p exploratory laparotomy, ileocecectomy, drainage of intraabdominal abscess 01/01/21 Dr. [place] Silos - NGT came out overnight, pt having some flatus - leave NGT out and start CLD today  - cxs with pan sensitive E.coli, WBC normalized, continue IV Zosyn through POD5 then can likely stop - drain SS this AM with 95 cc out in last 24h, continue drain - BID wet to dry dressing to midline - continue to mobilize as tolerated - start working on transition to PO pain control  FEN: CLD, IVF per primary team  VTE: SQH  ID: Zosyn 10/9>>   EtOH abuse Alcohol related cardiomyopathy T2DM Chronic anemia Hx of TIA HTN HLD Gout Hx of GIB CAD Tobacco abuse  LOS: 4 days    Norm Parcel, [facility] Surgery 01/05/2021, 8:41 AM Please see Amion for pager number during day hours 7:00am-4:30pm

## 2021-01-06 ENCOUNTER — Inpatient Hospital Stay (HOSPITAL_COMMUNITY): Payer: Medicare HMO

## 2021-01-06 ENCOUNTER — Inpatient Hospital Stay (HOSPITAL_COMMUNITY): Payer: Medicare HMO | Admitting: Anesthesiology

## 2021-01-06 ENCOUNTER — Encounter (HOSPITAL_COMMUNITY)
Admission: EM | Disposition: A | Discharge status: Skilled Nursing Facility | Payer: Self-pay | Source: Home / Self Care | Attending: Internal Medicine

## 2021-01-06 DIAGNOSIS — K3532 Acute appendicitis with perforation and localized peritonitis, without abscess: Secondary | ICD-10-CM

## 2021-01-06 DIAGNOSIS — N1831 Chronic kidney disease, stage 3a: Secondary | ICD-10-CM | POA: Diagnosis not present

## 2021-01-06 DIAGNOSIS — J439 Emphysema, unspecified: Secondary | ICD-10-CM | POA: Diagnosis not present

## 2021-01-06 HISTORY — PX: SPLENECTOMY, TOTAL: SHX788

## 2021-01-06 LAB — PROTIME-INR
INR: 1.3 — ABNORMAL HIGH (ref 0.8–1.2)
Prothrombin Time: 16 seconds — ABNORMAL HIGH (ref 11.4–15.2)

## 2021-01-06 LAB — CBC
HCT: 26 % — ABNORMAL LOW (ref 39.0–52.0)
HCT: 39.5 % (ref 39.0–52.0)
Hemoglobin: 8.2 g/dL — ABNORMAL LOW (ref 13.0–17.0)
Hemoglobin: 13.5 g/dL (ref 13.0–17.0)
MCH: 31.7 pg (ref 26.0–34.0)
MCH: 29.8 pg (ref 26.0–34.0)
MCHC: 31.5 g/dL (ref 30.0–36.0)
MCHC: 34.2 g/dL (ref 30.0–36.0)
MCV: 100.4 fL — ABNORMAL HIGH (ref 80.0–100.0)
MCV: 87.2 fL (ref 80.0–100.0)
Platelets: 289 10*3/uL (ref 150–400)
Platelets: 171 10*3/uL (ref 150–400)
RBC: 2.59 MIL/uL — ABNORMAL LOW (ref 4.22–5.81)
RBC: 4.53 MIL/uL (ref 4.22–5.81)
RDW: 15.9 % — ABNORMAL HIGH (ref 11.5–15.5)
RDW: 15.8 % — ABNORMAL HIGH (ref 11.5–15.5)
WBC: 13.3 10*3/uL — ABNORMAL HIGH (ref 4.0–10.5)
WBC: 10.9 10*3/uL — ABNORMAL HIGH (ref 4.0–10.5)
nRBC: 0 % (ref 0.0–0.2)
nRBC: 0 % (ref 0.0–0.2)

## 2021-01-06 LAB — POCT I-STAT 7, (LYTES, BLD GAS, ICA,H+H)
Acid-Base Excess: 0 mmol/L (ref 0.0–2.0)
Acid-base deficit: 2 mmol/L (ref 0.0–2.0)
Acid-base deficit: 3 mmol/L — ABNORMAL HIGH (ref 0.0–2.0)
Acid-base deficit: 3 mmol/L — ABNORMAL HIGH (ref 0.0–2.0)
Bicarbonate: 21.4 mmol/L (ref 20.0–28.0)
Bicarbonate: 24 mmol/L (ref 20.0–28.0)
Bicarbonate: 23.4 mmol/L (ref 20.0–28.0)
Bicarbonate: 23.1 mmol/L (ref 20.0–28.0)
Calcium, Ion: 1.11 mmol/L — ABNORMAL LOW (ref 1.15–1.40)
Calcium, Ion: 1.08 mmol/L — ABNORMAL LOW (ref 1.15–1.40)
Calcium, Ion: 0.99 mmol/L — ABNORMAL LOW (ref 1.15–1.40)
Calcium, Ion: 1.09 mmol/L — ABNORMAL LOW (ref 1.15–1.40)
HCT: 27 % — ABNORMAL LOW (ref 39.0–52.0)
HCT: 36 % — ABNORMAL LOW (ref 39.0–52.0)
HCT: 37 % — ABNORMAL LOW (ref 39.0–52.0)
HCT: 36 % — ABNORMAL LOW (ref 39.0–52.0)
Hemoglobin: 9.2 g/dL — ABNORMAL LOW (ref 13.0–17.0)
Hemoglobin: 12.2 g/dL — ABNORMAL LOW (ref 13.0–17.0)
Hemoglobin: 12.6 g/dL — ABNORMAL LOW (ref 13.0–17.0)
Hemoglobin: 12.2 g/dL — ABNORMAL LOW (ref 13.0–17.0)
O2 Saturation: 100 %
O2 Saturation: 100 %
O2 Saturation: 97 %
O2 Saturation: 100 %
Patient temperature: 97.2
Patient temperature: 102.7
Potassium: 4.4 mmol/L (ref 3.5–5.1)
Potassium: 5.1 mmol/L (ref 3.5–5.1)
Potassium: 4.7 mmol/L (ref 3.5–5.1)
Potassium: 4.3 mmol/L (ref 3.5–5.1)
Sodium: 139 mmol/L (ref 135–145)
Sodium: 137 mmol/L (ref 135–145)
Sodium: 137 mmol/L (ref 135–145)
Sodium: 137 mmol/L (ref 135–145)
TCO2: 22 mmol/L (ref 22–32)
TCO2: 25 mmol/L (ref 22–32)
TCO2: 25 mmol/L (ref 22–32)
TCO2: 24 mmol/L (ref 22–32)
pCO2 arterial: 30.3 mmHg — ABNORMAL LOW (ref 32.0–48.0)
pCO2 arterial: 47.8 mmHg (ref 32.0–48.0)
pCO2 arterial: 44.4 mmHg (ref 32.0–48.0)
pCO2 arterial: 35.5 mmHg (ref 32.0–48.0)
pH, Arterial: 7.453 — ABNORMAL HIGH (ref 7.350–7.450)
pH, Arterial: 7.309 — ABNORMAL LOW (ref 7.350–7.450)
pH, Arterial: 7.33 — ABNORMAL LOW (ref 7.350–7.450)
pH, Arterial: 7.43 (ref 7.350–7.450)
pO2, Arterial: 337 mmHg — ABNORMAL HIGH (ref 83.0–108.0)
pO2, Arterial: 416 mmHg — ABNORMAL HIGH (ref 83.0–108.0)
pO2, Arterial: 93 mmHg (ref 83.0–108.0)
pO2, Arterial: 267 mmHg — ABNORMAL HIGH (ref 83.0–108.0)

## 2021-01-06 LAB — COMPREHENSIVE METABOLIC PANEL
ALT: 15 U/L (ref 0–44)
ALT: 14 U/L (ref 0–44)
AST: 31 U/L (ref 15–41)
AST: 26 U/L (ref 15–41)
Albumin: 2 g/dL — ABNORMAL LOW (ref 3.5–5.0)
Albumin: 1.8 g/dL — ABNORMAL LOW (ref 3.5–5.0)
Alkaline Phosphatase: 71 U/L (ref 38–126)
Alkaline Phosphatase: 66 U/L (ref 38–126)
Anion gap: 14 (ref 5–15)
Anion gap: 10 (ref 5–15)
BUN: 24 mg/dL — ABNORMAL HIGH (ref 8–23)
BUN: 24 mg/dL — ABNORMAL HIGH (ref 8–23)
CO2: 22 mmol/L (ref 22–32)
CO2: 20 mmol/L — ABNORMAL LOW (ref 22–32)
Calcium: 8.8 mg/dL — ABNORMAL LOW (ref 8.9–10.3)
Calcium: 7.5 mg/dL — ABNORMAL LOW (ref 8.9–10.3)
Chloride: 102 mmol/L (ref 98–111)
Chloride: 106 mmol/L (ref 98–111)
Creatinine, Ser: 1.83 mg/dL — ABNORMAL HIGH (ref 0.61–1.24)
Creatinine, Ser: 1.54 mg/dL — ABNORMAL HIGH (ref 0.61–1.24)
GFR, Estimated: 41 mL/min — ABNORMAL LOW (ref >60)
GFR, Estimated: 50 mL/min — ABNORMAL LOW (ref >60)
Glucose, Bld: 188 mg/dL — ABNORMAL HIGH (ref 70–99)
Glucose, Bld: 223 mg/dL — ABNORMAL HIGH (ref 70–99)
Potassium: 4.7 mmol/L (ref 3.5–5.1)
Potassium: 4.1 mmol/L (ref 3.5–5.1)
Sodium: 138 mmol/L (ref 135–145)
Sodium: 136 mmol/L (ref 135–145)
Total Bilirubin: 2.7 mg/dL — ABNORMAL HIGH (ref 0.3–1.2)
Total Bilirubin: 4.2 mg/dL — ABNORMAL HIGH (ref 0.3–1.2)
Total Protein: 5.7 g/dL — ABNORMAL LOW (ref 6.5–8.1)
Total Protein: 4.3 g/dL — ABNORMAL LOW (ref 6.5–8.1)

## 2021-01-06 LAB — GLUCOSE, CAPILLARY
Glucose-Capillary: 131 mg/dL — ABNORMAL HIGH (ref 70–99)
Glucose-Capillary: 133 mg/dL — ABNORMAL HIGH (ref 70–99)
Glucose-Capillary: 135 mg/dL — ABNORMAL HIGH (ref 70–99)
Glucose-Capillary: 155 mg/dL — ABNORMAL HIGH (ref 70–99)
Glucose-Capillary: 174 mg/dL — ABNORMAL HIGH (ref 70–99)
Glucose-Capillary: 195 mg/dL — ABNORMAL HIGH (ref 70–99)

## 2021-01-06 LAB — TRAUMA TEG PANEL
CFF Max Amplitude: 48.7 mm — ABNORMAL HIGH (ref 15–32)
CFF Max Amplitude: 35 mm — ABNORMAL HIGH (ref 15–32)
Citrated Kaolin (R): 5.6 min (ref 4.6–9.1)
Citrated Kaolin (R): 6.7 min (ref 4.6–9.1)
Citrated Rapid TEG (MA): 71.9 mm — ABNORMAL HIGH (ref 52–70)
Citrated Rapid TEG (MA): 67.6 mm (ref 52–70)
Lysis at 30 Minutes: 0 % (ref 0.0–2.6)
Lysis at 30 Minutes: 0 % (ref 0.0–2.6)

## 2021-01-06 LAB — COOXEMETRY PANEL
Carboxyhemoglobin: 1.2 % (ref 0.5–1.5)
Carboxyhemoglobin: 1.2 % (ref 0.5–1.5)
Methemoglobin: 0.7 % (ref 0.0–1.5)
Methemoglobin: 0.9 % (ref 0.0–1.5)
O2 Saturation: 57.6 %
O2 Saturation: 52.3 %
Total hemoglobin: 9.6 g/dL — ABNORMAL LOW (ref 12.0–16.0)
Total hemoglobin: 14 g/dL (ref 12.0–16.0)

## 2021-01-06 LAB — BASIC METABOLIC PANEL
Anion gap: 12 (ref 5–15)
BUN: 22 mg/dL (ref 8–23)
CO2: 22 mmol/L (ref 22–32)
Calcium: 8.1 mg/dL — ABNORMAL LOW (ref 8.9–10.3)
Chloride: 104 mmol/L (ref 98–111)
Creatinine, Ser: 1.77 mg/dL — ABNORMAL HIGH (ref 0.61–1.24)
GFR, Estimated: 42 mL/min — ABNORMAL LOW (ref >60)
Glucose, Bld: 199 mg/dL — ABNORMAL HIGH (ref 70–99)
Potassium: 4.5 mmol/L (ref 3.5–5.1)
Sodium: 138 mmol/L (ref 135–145)

## 2021-01-06 LAB — BLOOD GAS, ARTERIAL
Acid-base deficit: 3.5 mmol/L — ABNORMAL HIGH (ref 0.0–2.0)
Bicarbonate: 20.5 mmol/L (ref 20.0–28.0)
FIO2: 60
O2 Saturation: 98.5 %
Patient temperature: 38.8
pCO2 arterial: 36.7 mmHg (ref 32.0–48.0)
pH, Arterial: 7.376 (ref 7.350–7.450)
pO2, Arterial: 130 mmHg — ABNORMAL HIGH (ref 83.0–108.0)

## 2021-01-06 LAB — DIC (DISSEMINATED INTRAVASCULAR COAGULATION)PANEL
D-Dimer, Quant: 10 ug/mL-FEU — ABNORMAL HIGH (ref 0.00–0.50)
Fibrinogen: 671 mg/dL — ABNORMAL HIGH (ref 210–475)
INR: 1.2 (ref 0.8–1.2)
Platelets: 185 10*3/uL (ref 150–400)
Prothrombin Time: 15.5 seconds — ABNORMAL HIGH (ref 11.4–15.2)
Smear Review: NONE SEEN
aPTT: 26 seconds (ref 24–36)

## 2021-01-06 LAB — PREPARE RBC (CROSSMATCH)

## 2021-01-06 LAB — LACTIC ACID, PLASMA
Lactic Acid, Venous: 2.7 mmol/L (ref 0.5–1.9)
Lactic Acid, Venous: 2.1 mmol/L (ref 0.5–1.9)
Lactic Acid, Venous: 1.9 mmol/L (ref 0.5–1.9)

## 2021-01-06 LAB — CULTURE, BLOOD (ROUTINE X 2)
Culture: NO GROWTH
Special Requests: ADEQUATE

## 2021-01-06 LAB — BLOOD PRODUCT ORDER (VERBAL) VERIFICATION

## 2021-01-06 LAB — TROPONIN I (HIGH SENSITIVITY)
Troponin I (High Sensitivity): 15 ng/L (ref <18)
Troponin I (High Sensitivity): 13 ng/L (ref <18)
Troponin I (High Sensitivity): 14 ng/L (ref <18)

## 2021-01-06 SURGERY — SPLENECTOMY
Anesthesia: General | Site: Abdomen

## 2021-01-06 MED ORDER — ETOMIDATE 2 MG/ML IV SOLN
20.0000 mg | Freq: Once | INTRAVENOUS | Status: AC
  Administered 2021-01-06: 20 mg via INTRAVENOUS

## 2021-01-06 MED ORDER — SODIUM CHLORIDE 0.9% IV SOLUTION: Freq: Once | INTRAVENOUS | Status: DC

## 2021-01-06 MED ORDER — NOREPINEPHRINE 16 MG/250ML-% IV SOLN
0.0000 ug/min | INTRAVENOUS | Status: DC
Start: 2021-01-06 — End: 2021-01-09
  Administered 2021-01-06: 20 ug/min via INTRAVENOUS
  Administered 2021-01-06: 38 ug/min via INTRAVENOUS
  Administered 2021-01-07: 2 ug/min via INTRAVENOUS
  Filled 2021-01-06 (×3): qty 250

## 2021-01-06 MED ORDER — FENTANYL CITRATE (PF) 100 MCG/2ML IJ SOLN
50.0000 ug | Freq: Once | INTRAMUSCULAR | Status: AC
Start: 2021-01-06 — End: 2021-01-06
  Administered 2021-01-06: 50 ug via INTRAVENOUS

## 2021-01-06 MED ORDER — CHLORHEXIDINE GLUCONATE 0.12% ORAL RINSE (MEDLINE KIT)
15.0000 mL | Freq: Two times a day (BID) | OROMUCOSAL | Status: DC
  Administered 2021-01-06 – 2021-01-08 (×5): 15 mL via OROMUCOSAL

## 2021-01-06 MED ORDER — MIDAZOLAM HCL 2 MG/2ML IJ SOLN
INTRAMUSCULAR | Status: AC
  Filled 2021-01-06: qty 2

## 2021-01-06 MED ORDER — DEXMEDETOMIDINE HCL IN NACL 400 MCG/100ML IV SOLN
0.0000 ug/kg/h | INTRAVENOUS | Status: DC
  Administered 2021-01-06 – 2021-01-08 (×5): 0.4 ug/kg/h via INTRAVENOUS
  Filled 2021-01-06 (×6): qty 100

## 2021-01-06 MED ORDER — MIDAZOLAM HCL 2 MG/2ML IJ SOLN
2.0000 mg | Freq: Once | INTRAMUSCULAR | Status: AC
  Administered 2021-01-06: 2 mg via INTRAVENOUS

## 2021-01-06 MED ORDER — ACETAMINOPHEN 160 MG/5ML PO SOLN: 650.0000 mg | Freq: Four times a day (QID) | ORAL | Status: DC

## 2021-01-06 MED ORDER — ROCURONIUM BROMIDE 100 MG/10ML IV SOLN
INTRAVENOUS | Status: DC | PRN
  Administered 2021-01-06: 50 mg via INTRAVENOUS
  Administered 2021-01-06: 20 mg via INTRAVENOUS
  Administered 2021-01-06: 30 mg via INTRAVENOUS

## 2021-01-06 MED ORDER — FENTANYL CITRATE (PF) 250 MCG/5ML IJ SOLN
INTRAMUSCULAR | Status: AC
  Filled 2021-01-06: qty 5

## 2021-01-06 MED ORDER — NOREPINEPHRINE 4 MG/250ML-% IV SOLN: 0.0000 ug/min | INTRAVENOUS | Status: DC

## 2021-01-06 MED ORDER — POLYETHYLENE GLYCOL 3350 17 G PO PACK: 17.0000 g | PACK | Freq: Every day | ORAL | Status: DC

## 2021-01-06 MED ORDER — SODIUM CHLORIDE 0.9% FLUSH
10.0000 mL | INTRAVENOUS | Status: DC | PRN
  Administered 2021-01-06 – 2021-02-06 (×4): 10 mL

## 2021-01-06 MED ORDER — FENTANYL CITRATE (PF) 100 MCG/2ML IJ SOLN
50.0000 ug | INTRAMUSCULAR | Status: DC | PRN
  Administered 2021-01-06: 50 ug via INTRAVENOUS
  Filled 2021-01-06: qty 2

## 2021-01-06 MED ORDER — FENTANYL CITRATE (PF) 100 MCG/2ML IJ SOLN
INTRAMUSCULAR | Status: DC | PRN
  Administered 2021-01-06: 100 ug via INTRAVENOUS
  Administered 2021-01-06 (×3): 50 ug via INTRAVENOUS

## 2021-01-06 MED ORDER — LACTATED RINGERS IV SOLN: INTRAVENOUS | Status: DC | PRN

## 2021-01-06 MED ORDER — NOREPINEPHRINE 4 MG/250ML-% IV SOLN
0.0000 ug/min | INTRAVENOUS | Status: DC
Start: 2021-01-06 — End: 2021-01-06
  Administered 2021-01-06: 36 ug/min via INTRAVENOUS
  Administered 2021-01-06: 8 ug/min via INTRAVENOUS
  Filled 2021-01-06: qty 500

## 2021-01-06 MED ORDER — PROPOFOL 10 MG/ML IV BOLUS
INTRAVENOUS | Status: AC
  Filled 2021-01-06: qty 20

## 2021-01-06 MED ORDER — ETOMIDATE 2 MG/ML IV SOLN
INTRAVENOUS | Status: AC
  Filled 2021-01-06: qty 20

## 2021-01-06 MED ORDER — FENTANYL CITRATE (PF) 100 MCG/2ML IJ SOLN
INTRAMUSCULAR | Status: AC
  Filled 2021-01-06: qty 2

## 2021-01-06 MED ORDER — SODIUM CHLORIDE 0.9 % IV SOLN: INTRAVENOUS | Status: DC | PRN

## 2021-01-06 MED ORDER — VASOPRESSIN 20 UNITS/100 ML INFUSION FOR SHOCK
0.0000 [IU]/min | INTRAVENOUS | Status: DC
  Administered 2021-01-06: 0.04 [IU]/min via INTRAVENOUS
  Administered 2021-01-06: 0.03 [IU]/min via INTRAVENOUS
  Administered 2021-01-07 – 2021-01-08 (×4): 0.04 [IU]/min via INTRAVENOUS
  Filled 2021-01-06 (×5): qty 100

## 2021-01-06 MED ORDER — ROCURONIUM BROMIDE 10 MG/ML (PF) SYRINGE
PREFILLED_SYRINGE | INTRAVENOUS | Status: AC
  Filled 2021-01-06: qty 10

## 2021-01-06 MED ORDER — LACTATED RINGERS IV BOLUS
1000.0000 mL | Freq: Once | INTRAVENOUS | Status: AC
  Administered 2021-01-06: 1000 mL via INTRAVENOUS

## 2021-01-06 MED ORDER — NOREPINEPHRINE 4 MG/250ML-% IV SOLN
INTRAVENOUS | Status: DC | PRN
Start: 2021-01-06 — End: 2021-01-06
  Administered 2021-01-06: 25 ug/kg/min via INTRAVENOUS

## 2021-01-06 MED ORDER — SODIUM CHLORIDE 0.9 % IV BOLUS
1000.0000 mL | Freq: Once | INTRAVENOUS | Status: AC
  Administered 2021-01-06: 1000 mL via INTRAVENOUS

## 2021-01-06 MED ORDER — ACETAMINOPHEN 325 MG RE SUPP
650.0000 mg | Freq: Four times a day (QID) | RECTAL | Status: DC
  Administered 2021-01-06 – 2021-01-08 (×6): 650 mg via RECTAL
  Filled 2021-01-06 (×3): qty 2
  Filled 2021-01-06 (×2): qty 1
  Filled 2021-01-06 (×7): qty 2
  Filled 2021-01-06: qty 1
  Filled 2021-01-06 (×2): qty 2

## 2021-01-06 MED ORDER — SODIUM CHLORIDE 0.9% FLUSH
10.0000 mL | Freq: Two times a day (BID) | INTRAVENOUS | Status: DC
  Administered 2021-01-06 – 2021-01-14 (×15): 10 mL

## 2021-01-06 MED ORDER — 0.9 % SODIUM CHLORIDE (POUR BTL) OPTIME
TOPICAL | Status: DC | PRN
  Administered 2021-01-06: 4000 mL
  Administered 2021-01-06: 1000 mL

## 2021-01-06 MED ORDER — OXYCODONE HCL 5 MG PO TABS
5.0000 mg | ORAL_TABLET | ORAL | Status: DC | PRN
Start: 2021-01-06 — End: 2021-01-06

## 2021-01-06 MED ORDER — SODIUM BICARBONATE 8.4 % IV SOLN
INTRAVENOUS | Status: AC
  Filled 2021-01-06: qty 50

## 2021-01-06 MED ORDER — EPINEPHRINE HCL 5 MG/250ML IV SOLN IN NS
0.5000 ug/min | INTRAVENOUS | Status: DC
  Administered 2021-01-06: 0.5 ug/min via INTRAVENOUS
  Filled 2021-01-06: qty 250

## 2021-01-06 MED ORDER — FENTANYL CITRATE (PF) 100 MCG/2ML IJ SOLN
50.0000 ug | Freq: Once | INTRAMUSCULAR | Status: DC
Start: 2021-01-06 — End: 2021-01-09

## 2021-01-06 MED ORDER — FENTANYL BOLUS VIA INFUSION
50.0000 ug | INTRAVENOUS | Status: DC | PRN
  Administered 2021-01-06 – 2021-01-07 (×2): 50 ug via INTRAVENOUS
  Filled 2021-01-06: qty 100

## 2021-01-06 MED ORDER — NOREPINEPHRINE 4 MG/250ML-% IV SOLN
2.0000 ug/min | INTRAVENOUS | Status: DC
  Administered 2021-01-06: 2 ug/min via INTRAVENOUS
  Filled 2021-01-06: qty 250

## 2021-01-06 MED ORDER — SODIUM CHLORIDE 0.9 % IV SOLN: 250.0000 mL | INTRAVENOUS | Status: DC

## 2021-01-06 MED ORDER — DEXTROMETHORPHAN POLISTIREX ER 30 MG/5ML PO SUER
15.0000 mg | Freq: Every evening | ORAL | Status: DC | PRN
  Administered 2021-01-09: 15 mg
  Filled 2021-01-06 (×3): qty 5

## 2021-01-06 MED ORDER — DOCUSATE SODIUM 50 MG/5ML PO LIQD
100.0000 mg | Freq: Two times a day (BID) | ORAL | Status: DC
Start: 2021-01-06 — End: 2021-01-08

## 2021-01-06 MED ORDER — ROCURONIUM BROMIDE 10 MG/ML (PF) SYRINGE
80.0000 mg | PREFILLED_SYRINGE | Freq: Once | INTRAVENOUS | Status: AC
  Administered 2021-01-06: 80 mg via INTRAVENOUS

## 2021-01-06 MED ORDER — SODIUM BICARBONATE 8.4 % IV SOLN
50.0000 meq | Freq: Once | INTRAVENOUS | Status: AC
  Administered 2021-01-06: 50 meq via INTRAVENOUS

## 2021-01-06 MED ORDER — ORAL CARE MOUTH RINSE
15.0000 mL | OROMUCOSAL | Status: DC
  Administered 2021-01-06 – 2021-01-08 (×23): 15 mL via OROMUCOSAL

## 2021-01-06 MED ORDER — SODIUM CHLORIDE 0.9% IV SOLUTION: Freq: Once | INTRAVENOUS | Status: AC

## 2021-01-06 MED ORDER — FENTANYL 2500MCG IN NS 250ML (10MCG/ML) PREMIX INFUSION
50.0000 ug/h | INTRAVENOUS | Status: DC
Start: 2021-01-06 — End: 2021-01-08
  Administered 2021-01-06: 50 ug/h via INTRAVENOUS
  Administered 2021-01-07: 150 ug/h via INTRAVENOUS
  Administered 2021-01-07: 100 ug/h via INTRAVENOUS
  Administered 2021-01-08: 150 ug/h via INTRAVENOUS
  Filled 2021-01-06 (×4): qty 250

## 2021-01-06 MED ORDER — CALCIUM GLUCONATE-NACL 1-0.675 GM/50ML-% IV SOLN
1.0000 g | Freq: Once | INTRAVENOUS | Status: AC
  Administered 2021-01-06: 1000 mg via INTRAVENOUS
  Filled 2021-01-06: qty 50

## 2021-01-06 MED ORDER — FENTANYL CITRATE (PF) 100 MCG/2ML IJ SOLN
50.0000 ug | INTRAMUSCULAR | Status: DC | PRN
  Administered 2021-01-06: 100 ug via INTRAVENOUS

## 2021-01-06 SURGICAL SUPPLY — 42 items
APL PRP STRL LF DISP 70% ISPRP (MISCELLANEOUS)
BAG COUNTER SPONGE SURGICOUNT (BAG) ×2 IMPLANT
BAG SPNG CNTER NS LX DISP (BAG) ×1
BAG SURGICOUNT SPONGE COUNTING (BAG) ×1
BLADE CLIPPER SURG (BLADE) IMPLANT
CANISTER SUCT 3000ML PPV (MISCELLANEOUS) ×3 IMPLANT
CANISTER WOUND CARE 500ML ATS (WOUND CARE) ×2 IMPLANT
CHLORAPREP W/TINT 26 (MISCELLANEOUS) ×1 IMPLANT
COVER SURGICAL LIGHT HANDLE (MISCELLANEOUS) ×3 IMPLANT
DRAPE LAPAROSCOPIC ABDOMINAL (DRAPES) ×3 IMPLANT
DRAPE UTILITY XL STRL (DRAPES) ×6 IMPLANT
DRAPE WARM FLUID 44X44 (DRAPES) ×1 IMPLANT
DRSG VAC ATS LRG SENSATRAC (GAUZE/BANDAGES/DRESSINGS) ×2 IMPLANT
ELECT BLADE 6.5 EXT (BLADE) ×3 IMPLANT
ELECT CAUTERY BLADE 6.4 (BLADE) ×3 IMPLANT
ELECT REM PT RETURN 9FT ADLT (ELECTROSURGICAL) ×3
ELECTRODE REM PT RTRN 9FT ADLT (ELECTROSURGICAL) ×1 IMPLANT
GLOVE SURG SIGNA 7.5 PF LTX (GLOVE) ×3 IMPLANT
GOWN STRL REUS W/ TWL LRG LVL3 (GOWN DISPOSABLE) ×2 IMPLANT
GOWN STRL REUS W/ TWL XL LVL3 (GOWN DISPOSABLE) ×1 IMPLANT
GOWN STRL REUS W/TWL LRG LVL3 (GOWN DISPOSABLE) ×6
GOWN STRL REUS W/TWL XL LVL3 (GOWN DISPOSABLE) ×3
HANDLE SUCTION POOLE (INSTRUMENTS) ×1 IMPLANT
KIT BASIN OR (CUSTOM PROCEDURE TRAY) ×3 IMPLANT
KIT TURNOVER KIT B (KITS) ×3 IMPLANT
NS IRRIG 1000ML POUR BTL (IV SOLUTION) ×6 IMPLANT
PACK GENERAL/GYN (CUSTOM PROCEDURE TRAY) ×3 IMPLANT
PAD ARMBOARD 7.5X6 YLW CONV (MISCELLANEOUS) ×3 IMPLANT
PENCIL SMOKE EVACUATOR (MISCELLANEOUS) ×3 IMPLANT
SPECIMEN JAR X LARGE (MISCELLANEOUS) ×3 IMPLANT
SPONGE T-LAP 18X18 ~~LOC~~+RFID (SPONGE) ×12 IMPLANT
STAPLER VISISTAT 35W (STAPLE) ×3 IMPLANT
SUCTION POOLE HANDLE (INSTRUMENTS) ×3
SUT PDS AB 1 TP1 96 (SUTURE) ×6 IMPLANT
SUT SILK 0 TIES 10X30 (SUTURE) ×2 IMPLANT
SUT SILK 2 0 SH CR/8 (SUTURE) ×3 IMPLANT
SUT SILK 2 0 TIES 10X30 (SUTURE) ×3 IMPLANT
SUT SILK 2 0SH CR/8 30 (SUTURE) ×1 IMPLANT
SUT SILK 3 0 SH 30 (SUTURE) ×2 IMPLANT
TOWEL GREEN STERILE (TOWEL DISPOSABLE) ×3 IMPLANT
TOWEL GREEN STERILE FF (TOWEL DISPOSABLE) ×3 IMPLANT
TRAY FOLEY MTR SLVR 16FR STAT (SET/KITS/TRAYS/PACK) IMPLANT

## 2021-01-06 NOTE — Procedures (Signed)
Central Venous Catheter Insertion Procedure Note  Travis [name]  [id number]  [birth year]  Date:01/06/21  Time:3:15 AM   Provider Performing:Sherill Wegener Shearon Stalls   Procedure: Insertion of Non-tunneled Central Venous Catheter([id number]) without US guidance  Indication(s) Medication administration and Difficult access  Consent Unable to obtain consent due to emergent nature of procedure.  Anesthesia Topical only with 1% lidocaine   Timeout Verified patient identification, verified procedure, site/side was marked, verified correct patient position, special equipment/implants available, medications/allergies/relevant history reviewed, required imaging and test results available.  Sterile Technique Maximal sterile technique including full sterile barrier drape, hand hygiene, sterile gown, sterile gloves, mask, hair covering, sterile ultrasound probe cover (if used).  Procedure Description Area of catheter insertion was cleaned with chlorhexidine and draped in sterile fashion.  Without real-time ultrasound guidance a central venous catheter was placed into the left subclavian vein. Nonpulsatile blood flow and easy flushing noted in all ports.  The catheter was sutured in place and sterile dressing applied.  Complications/Tolerance None; patient tolerated the procedure well. Chest X-ray is ordered to verify placement for internal jugular or subclavian cannulation.   Chest x-ray is not ordered for femoral cannulation.  EBL Minimal  Specimen(s) None   Montey Hora, PA - [facility] For pager details, please see AMION or use Epic chat  After 1900, please call [place] for cross coverage needs 01/06/2021, 3:15 AM

## 2021-01-06 NOTE — Progress Notes (Signed)
Overnight event  Notified by RN that patient is now hypotensive.  Stat CT abdomen pelvis not done yet as he is still receiving contrast via NG tube.  Patient seen and examined at bedside.  Hypotensive with systolic in the [id number], tachycardic with heart rate in 120s.  Awake and alert.  Complained of severe abdominal pain and appeared uncomfortable.  On exam, abdomen distended with generalized tenderness to palpation and hypoactive bowel sounds. Lungs clear, no rales appreciated.  He was given a fluid bolus but continued to be hypotensive, started on Levophed.  Stat labs ordered including CBC, CMP, lactate, and troponin.  Spoke to Dr. Bobbye Morton who came to bedside to assess the patient, appreciate help.  Patient is being transported for stat CT.  Critical care team will also see the patient, appreciate help.

## 2021-01-06 NOTE — Anesthesia Postprocedure Evaluation (Signed)
Anesthesia Post Note  Patient: Travis [name]  Procedure(s) Performed: SPLENECTOMY (Abdomen)     Patient location during evaluation: SICU Anesthesia Type: General Level of consciousness: sedated Pain management: pain level controlled Vital Signs Assessment: post-procedure vital signs reviewed and stable Respiratory status: patient remains intubated per anesthesia plan Cardiovascular status: stable Postop Assessment: no apparent nausea or vomiting Anesthetic complications: no   No notable events documented.  Last Vitals:  Vitals:   01/06/21 0733 01/06/21 0735  BP:    Pulse: 92   Resp: 20   Temp:  37 C  SpO2:      Last Pain:  Vitals:   01/06/21 0735  TempSrc: Bladder  PainSc:                  [place]

## 2021-01-06 NOTE — Progress Notes (Signed)
Multiple units of blood transfused, patient maxed on levo and vaso. To OR for splenectomy. Unable to reach family listed in the chart. Proceed due to emergent indications.   Jesusita Oka, MD General and [facility]

## 2021-01-06 NOTE — Op Note (Signed)
   Operative Note   Date: 01/06/2021  Procedure: exploratory laparotomy, splenectomy, incisional wound vac application ([id number])  Pre-op diagnosis: spleen laceration  Post-op diagnosis: grade 5 splenic laceration (shattered spleen)  Indication and clinical history: The patient is a 65 y.o. year old male with a splenic laceration of unclear etiology, refractory for blood transfusion.     Surgeon: Jesusita Oka, MD  Anesthesiologist: Marcie Bal, MD Anesthesia: General  Findings:  Specimen: spleen EBL: 100cc operative, ~2L of blood and clot evacuated Drains/Implants: none additional; pre-existing RLQ JP drain left in place  Disposition: ICU - intubated and critically ill.  Description of procedure: The patient was positioned supine on the operating room table. General anesthetic induction was uneventful. Time-out was performed verifying correct patient, procedure, and administration of pre-operative antibiotics. This procedure was performed emergently and therefore informed consent was not obtained. The abdomen was prepped and draped in the usual sterile fashion.  The fascial sutures were cut and the abdominal cavity entered. The spleen was completely shattered.  A large amount of blood, clot, and fractured spleen were manually evacuated. The splenic hilum was controlled with a Kelly clamp and ligated with two zero silk sutures. The spleen was transected and the specimen send to pathology. The abdomen was copiously irrigated and the remainder of the abdomen inspected. The ileocolic anastomosis appeared intact. The fascia was closed with #1 looped PDS suture and an incisional wound vac applied as sterile dressing.   All sponge and instrument counts were correct at the conclusion of the procedure. The patient was awakened from anesthesia, extubated uneventfully, and transported to the PACU in good condition. There were no complications.    CASE DATA:  Type of patient?: DOW CASE  (Surgical Hospitalist [facility])  Status of Case? EMERGENT Add On  Infection Present At Time Of Surgery (PATOS)?  NO    Jesusita Oka, MD General and [facility]

## 2021-01-06 NOTE — Progress Notes (Signed)
NAME:  Travis [name], MRN:  [id number], DOB:  07-27-1955, LOS: 5 ADMISSION DATE:  01/01/2021, CONSULTATION DATE:  01/01/2021 REFERRING MD:  Dr Georganna Skeans, CHIEF COMPLAINT:  abdominal pain   History of Present Illness:  [name]. Travis [name] is a 65 year old gentleman with an extended PMH that includes but not limited to CHF, CKD 3A, COPD, CRA O, TIA, diabetes, hypertension, gout, hyperlipidemia, CAD (nonobstructive). She presented to the ED on 01/01/21 with complaints of right lower quadrant abdominal pain. He was found to be pyrexial and tachycardiac. Work up included a CT scan of the abdomen and pelvis which revealed:  "Circumferential mucosal thickening and indistinctness of the bowel wall at the base of the appendix and the distal cecum at the ileocecal junction. There is an associated milder mucosal thickening and indistinctness of the terminal ileum. The distal appendix is decompressed, contains gas and fecal matter and overall has normal appearance. Small amount of free fluid and free gas in the right lower quadrant of the abdomen. These findings are likely due to acute appendicitis with perforation. It is unclear however whether the mucosal thickening of the cecum is reactive or is the source of appendiceal obstruction. Underlying malignancy such as mucinous tumor is in the differential diagnosis. 2. Small foci of free intraperitoneal gas and small amount of fluid scattered in the mesentery of the right lower and upper quadrants of the abdomen, and in perihepatic location."  Patient was therefore taken to the operating room where he was found to have a perforated appendix and abdominal abscess. He is S/P DIAGNOSTIC LAPAROSCOPY; EXPLORATORY LAPAROTOMY, ILECECECTOMY, DRAINAGE OF INTRA-ABDOMINAL ABSCESS  The patient is now in the ICU and on mechanical ventilation.  He arrived on Neosynephrine drip for vasopressor support and on IVF.  I have seen and examined the patient.  Pertinent   Medical History  CKD 3A COPD TIA DM  Significant Hospital Events: Including procedures, antibiotic start and stop dates in addition to other pertinent events   10/9: ex lap, remained intubated 10/10: extubated 10/12 transferred to [facility] 10/14 0130 hypotensive with worsening abd pain, surgery called and asked for repeat CT scan.  Started on Levophed.  Back to PCCM service. 6U pRBC total, 4U FFP total. To OR for splenectomy.  Interim History / Subjective:  Just back from OR. Giving calcium. Sedated. Pressor requirement down substantially.  Objective   Blood pressure 97/76, pulse 92, temperature 98.6 F (37 C), temperature source Bladder, resp. rate 20, height 6' (1.829 m), weight 85.1 kg, SpO2 100 %. CVP:  [0 mmHg-8 mmHg] 7 mmHg  Vent Mode: PRVC FiO2 (%):  [60 %-100 %] 60 % Set Rate:  [20 bmp] 20 bmp Vt Set:  [620 mL] 620 mL PEEP:  [5 cmH20] 5 cmH20 Plateau Pressure:  [16 cmH20-18 cmH20] 16 cmH20   Intake/Output Summary (Last 24 hours) at 01/06/2021 0747 Last data filed at 01/06/2021 [id number] Gross per 24 hour  Intake 4812.32 ml  Output 2020 ml  Net 2792.32 ml   Filed Weights   01/04/21 0352 01/05/21 0328 01/06/21 0438  Weight: 84.4 kg 85.9 kg 85.1 kg    Examination: General appearance: 65 y.o., male, chronically ill appearing Eyes: pupils equal, not tracking HENT: NCAT; dry MM Neck: Trachea midline; no lymphadenopathy, no JVD Lungs: coarse mech breath sounds, no crackles, no wheeze, equal chest rise CV: RRR, no MRGs  Abdomen: Soft, non-tender; non-distended, hypoactive BS, mid-line incision with dressing in place Extremities: No peripheral edema, lukewarm Neuro: deeply sedated  CXR stable, ETT in good position  Assessment & Plan:   Shock: Hemorrhagic shock overnight from ruptured spleen - odd that it happened in absence of trauma. S/p 6U pRBC, 4U FFP. - PLTs if remains on pressors later this AM - given 1g Ca gluconate now - trend CBC - wean levo for MAP  65  Acute respiratory failure Acute encephalopathy Intubated in setting of TME, shock. - full vent support - SAT/SBT later this morning  Ruptured appendicitis s/p ilecectomy  - zosyn - watch drain output - hold TF for now  AKI on CKD - f/u response to resuscitative measures   Hx of Type 2 DM. - SSI  Hx of nonischemic cardiomyopathy, non obstructive CAD, HTN. - Continue to hold home meds.    Best Practice (right click and "Reselect all SmartList Selections" daily)   Diet/type: NPO DVT prophylaxis: SCD GI prophylaxis: H2B Lines: yes and it is still needed Foley:  Yes, and it is still needed Code Status:  full code Last date of multidisciplinary goals of care discussion: with pt 10/11  CC time: 35 min.   Fredirick [facility]lin [facility]  01/06/2021, 7:47 AM

## 2021-01-06 NOTE — Procedures (Signed)
Arterial Catheter Insertion Procedure Note  Travis [name]  [id number]  26-Jan-1956  Date:01/06/21  [id number]   Provider Performing: Kelle Darting    Procedure: Insertion of Arterial Line [phone]) without US guidance  Indication(s) Blood pressure monitoring and/or need for frequent ABGs  Consent Unable to obtain consent due to emergent nature of procedure.  Anesthesia None   Time Out Verified patient identification, verified procedure, site/side was marked, verified correct patient position, special equipment/implants available, medications/allergies/relevant history reviewed, required imaging and test results available.   Sterile Technique Maximal sterile technique including full sterile barrier drape, hand hygiene, sterile gown, sterile gloves, mask, hair covering, sterile ultrasound probe cover (if used).   Procedure Description Area of catheter insertion was cleaned with chlorhexidine and draped in sterile fashion. With real-time ultrasound guidance an arterial catheter was placed into the right radial artery.  Appropriate arterial tracings confirmed on monitor.     Complications/Tolerance None; patient tolerated the procedure well.   EBL Minimal   Specimen(s) None

## 2021-01-06 NOTE — Procedures (Signed)
Intubation Procedure Note  Travis [name]  [id number]  [birth year]  Date:01/06/21  Time:2:57 AM   Provider Performing:Brent Hansford Hirt    Procedure: Intubation ([id number])  Indication(s) Respiratory Failure  Consent Risks of the procedure as well as the alternatives and risks of each were explained to the patient and/or caregiver.  Consent for the procedure was obtained and is signed in the bedside chart   Anesthesia Etomidate, Versed, Fentanyl, and Rocuronium   Time Out Verified patient identification, verified procedure, site/side was marked, verified correct patient position, special equipment/implants available, medications/allergies/relevant history reviewed, required imaging and test results available.   Sterile Technique Usual hand hygeine, masks, and gloves were used   Procedure Description Patient positioned in bed supine.  Sedation given as noted above.  Patient was intubated with endotracheal tube using Glidescope.  View was Grade 1 full glottis .  Number of attempts was 1.  Colorimetric CO2 detector was consistent with tracheal placement.   Complications/Tolerance None; patient tolerated the procedure well. Chest X-ray is ordered to verify placement.   EBL Minimal   Specimen(s) None  Roselie Awkward, MD [place] [employer] [facility] Pager: [phone] Cell: [phone] After 7:00 pm call [place]  [phone]

## 2021-01-06 NOTE — Anesthesia Preprocedure Evaluation (Signed)
Anesthesia Evaluation  Patient identified by MRN, date of birth, ID band Patient awake  General Assessment Comment:Intubated  Reviewed: Allergy & Precautions, NPO status , Patient's Chart, lab work & pertinent test resultsPreop documentation limited or incomplete due to emergent nature of procedure.  Airway Mallampati: Intubated       Dental   Pulmonary asthma , COPD, Current Smoker and Patient abstained from smoking.,    breath sounds clear to auscultation       Cardiovascular hypertension, + CAD   Rhythm:regular Rate:Normal  Non-obstructive CAD. EF 45%  Currently hypotensive, shock, bleeding from spleen.   On levo and vaso gtts   Neuro/Psych    GI/Hepatic Recent ruptured appendectomy and ileocecetomy.  Now has a bleeding splenic laceration.   Endo/Other  diabetes, Type 2  Renal/GU Renal InsufficiencyRenal disease     Musculoskeletal  (+) Arthritis ,   Abdominal   Peds  Hematology  (+) Blood dyscrasia, anemia ,   Anesthesia Other Findings   Reproductive/Obstetrics                             Anesthesia Physical Anesthesia Plan  ASA: 3 and emergent  Anesthesia Plan: General   Post-op Pain Management:    Induction: Intravenous  PONV Risk Score and Plan: 1 and Ondansetron, Midazolam, Treatment may vary due to age or medical condition and Dexamethasone  Airway Management Planned: Oral ETT  Additional Equipment: Arterial line  Intra-op Plan:   Post-operative Plan: Post-operative intubation/ventilation  Informed Consent:   Plan Discussed with: CRNA, Anesthesiologist and Surgeon  Anesthesia Plan Comments:         Anesthesia Quick Evaluation

## 2021-01-06 NOTE — Progress Notes (Signed)
Attending:    Subjective: 65 y/o male admitted 10/9 with right lower abdominal pain, noted to have perforated appendicitis. Went to the OR on admission for ileocecectomy, drainage of abscess.  Extubated, sent to [facility].  PCCM consulted tonight because of a change in mental status, new tachycardia, hypotension.  Noted more abdominal pain.  He has received 1.5L normal saline this evening, went for a CT abdomen/pelvis.   On my exam he is very lethargic.    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 05/2019 Echo: EF 35-40%; b. 10/2019 Echo: EF 45-50%.   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 12/2010 Echo: EF 20-25%; b 08/2013 Echo: EF 45-50%; c. 01/2015 Echo: EF 25-30%; d. 07/2016 Echo: EF 35-40%; e. 05/2019 Echo: EF 35-40%; d. 10/2019 Echo: EF 45-50%, mild LVH, mild red RV fxn, mildl dil RA, Triv MR. Mild to mod Ao Sclerosis w/o stenosis.   Hyperlipidemia    Hypertension    Nonischemic cardiomyopathy (HCC)    Nonobstructive Coronary artery disease    a. 01/2015 Cath: LM nl, LAD 30p/m, LCX nl, RCA 10p/m, RPDA min irregs.   Osteoarthritis    Pneumonia    TIA (transient ischemic attack)    Tobacco abuse    Vision loss    peripherial vision only left eye.Central Retinal  artery occusion      Objective: Vitals:   01/05/21 2300 01/05/21 2330 01/06/21 0000 01/06/21 0030  BP: (!) '87/71 91/77 94/73 '$ 99/75  Pulse: (!) 129 (!) 125 (!) 128   Resp: (!) 28 (!) 26 (!) 32 (!) 26  Temp:      TempSrc:      SpO2: 99% 98% 95%   Weight:      Height:          Intake/Output Summary (Last 24 hours) at  01/06/2021 0222 Last data filed at 01/06/2021 0111 Gross per 24 hour  Intake 1198.68 ml  Output 1265 ml  Net -66.32 ml    General:  Tachycardic, tachypnea in bed HENT: NCAT OP clear PULM: CTA B, normal effort CV: tachy, no mgr, extremities cool to touch GI: no bowel sounds, drain RLQ, mildly distended MSK: normal bulk and tone Neuro: Awake, speaks only in 1-2 word sentences, falls asleep easily    CBC    Component Value Date/Time   WBC 13.3 (H) 01/06/2021 0124   RBC 2.59 (L) 01/06/2021 0124   HGB 8.2 (L) 01/06/2021 0124   HGB 11.3 (L) 01/13/2020 1410   HCT 26.0 (L) 01/06/2021 0124   HCT 33.7 (L) 01/13/2020 1410   PLT 289 01/06/2021 0124   PLT 250 01/13/2020 1410   MCV 100.4 (H) 01/06/2021 0124   MCV 98 (H) 01/13/2020 1410   MCV 90 04/20/2014 1019   MCH 31.7 01/06/2021 0124   MCHC 31.5 01/06/2021 0124   RDW 15.9 (H) 01/06/2021 0124   RDW 15.1 01/13/2020 1410   RDW 16.2 (H) 04/20/2014 1019   LYMPHSABS 1.3 01/01/2021  1023   LYMPHSABS 1.0 01/13/2020 1410   LYMPHSABS 2.1 04/20/2014 1019   MONOABS 0.3 01/01/2021 1023   MONOABS 0.5 04/20/2014 1019   EOSABS 0.2 01/01/2021 1023   EOSABS 1.2 (H) 01/13/2020 1410   EOSABS 0.3 04/20/2014 1019   BASOSABS 0.0 01/01/2021 1023   BASOSABS 0.0 01/13/2020 1410   BASOSABS 0.1 04/20/2014 1019   BASOSABS 0 06/13/2012 1515    BMET    Component Value Date/Time   NA 139 01/05/2021 0211   NA 138 06/20/2020 1034   NA 141 04/20/2014 1019   K 3.8 01/05/2021 0211   K 3.7 04/20/2014 1019   CL 104 01/05/2021 0211   CL 106 04/20/2014 1019   CO2 25 01/05/2021 0211   CO2 30 04/20/2014 1019   GLUCOSE 119 (H) 01/05/2021 0211   GLUCOSE 102 (H) 04/20/2014 1019   BUN 23 01/05/2021 0211   BUN 18 06/20/2020 1034   BUN 14 04/20/2014 1019   CREATININE 1.43 (H) 01/05/2021 0211   CREATININE 1.18 04/20/2014 1019   CALCIUM 9.1 01/05/2021 0211   CALCIUM 9.3 04/20/2014 1019   GFRNONAA 55 (L) 01/05/2021 0211   GFRNONAA >60 04/20/2014 1019    GFRNONAA >60 11/19/2012 1107   GFRAA 53 (L) 01/13/2020 1410   GFRAA >60 04/20/2014 1019   GFRAA >60 11/19/2012 1107    CXR images none  Impression/Plan: Post operative shock on 10/14: per surgery, splenic laceration seen on CT abdomen, so suspect hemorrhagic shock, complicated by underlying systolic heart failure.   > place CVL > transfuse 2 U PRBC now, 2 U PRBC ahead > check coag > tele > to OR tonight > check troponin, 12 lead > levophed for MAP > 65 > check CVP, coox > arterial line  Ruptured appendicitis, s/p ilecectomy > continue zosyn > monitor drain output > hold tube feeding, going to OR  CKD 3b, at risk for AK > monitor UOP > Monitor BMET and UOP > Replace electrolytes as needed  Hyperglycemia > monitor glucose > SSI  Respiratory distress in setting of worsening shock > intubate now for airway protection > full mechanical vent support > WUA/SBT after OR  Acute metabolic encephalopathy in setting of shock Need for sedation for mechanical ventilation > PAD protocol > fentanyl/precedex per PAD > RASS target -1    My cc time 45 minutes  Roselie Awkward, MD [place] P[facility]M Pager: [phone] Cell: [phone] After 7pm: [phone]

## 2021-01-06 NOTE — Progress Notes (Signed)
Called by primary team ~2200 for increased abdominal pain and distension. Patient noted to be tachycardic at this time. Patient is POD5 s/p exlap and ileocecectomy with primary anastomosis and drain placement for perf appy. Recommended CT A/P with IV and PO contrast. WBC normal this AM. Creatinine noted to be 1.46 and patient with CKD3. Patient is a candidate for contrast admin based on current renal function, however primary team declines to proceed with IV contrast. CT A/P with PO contrast only recommended as second line. Primary attending notes concerns re: mentation, so recommended NGT placement for contrast admin. Orders placed for NG and imaging by me. Noted patient started on CLD earlier today and recommended NG be placed to sxn after placement for gastric decompression prior to contrast admin.   Called again ~0100 for continued/worsened AMS, worsening tachycardia, and new hypotension. I evaluated the patient in person. Patient is clearly altered with HR 130s and SBP 70s, receiving IVF bolus. PO contrast incomplete. Abd full and diffusely tender, midline incision with healthy granulation tissue at the base and without drainage, JP with S>S o/p, report of ~150 so far o/n. Recommended placing NGT to sxn, no further contrast admin, initiation of vasopressors, and expediting imaging when patient is stable. I discussed with CT and next available table will be reserved for this patient. Primary attending reports plan to consult CCM and I am in agreement. Await CT imaging.   Jesusita Oka, MD General and [place] [facility]

## 2021-01-06 NOTE — Progress Notes (Signed)
[facility] Progress Note Patient Name: Travis [name] DOB: 07-Dec-1955 MRN: [id number]   Date of Service  01/06/2021  HPI/Events of Note  Pt maxed on levo; MAP still low  eICU Interventions  - ordered another 2 units of prbc - added vaso - also added a fent gtt - central venous catheter and OGT safe to use     Intervention Category Major Interventions: Shock - evaluation and management  Tilden Dome 01/06/2021, 4:23 AM

## 2021-01-06 NOTE — Transfer of Care (Signed)
Immediate Anesthesia Transfer of Care Note  Patient: HORACE ROBARDS  Procedure(s) Performed: SPLENECTOMY (Abdomen)  Patient Location: ICU  Anesthesia Type:General  Level of Consciousness: sedated and unresponsive  Airway & Oxygen Therapy: Patient remains intubated per anesthesia plan and Patient placed on Ventilator (see vital sign flow sheet for setting)  Post-op Assessment: Report given to RN and Post -op Vital signs reviewed and stable  Post vital signs: Reviewed and stable  Last Vitals:  Vitals Value Taken Time  BP    Temp    Pulse    Resp    SpO2      Last Pain:  Vitals:   01/06/21 0525  TempSrc: Bladder  PainSc:       Patients Stated Pain Goal: 10 ([id number])  Complications: No notable events documented.

## 2021-01-06 NOTE — Progress Notes (Addendum)
PCCM Brief Progress Note  Interdisciplinary Goals of Care Family Meeting   Date carried out:: 01/06/2021  Location of the meeting: Bedside  Member's involved: Physician, Bedside Registered Nurse, and Family Member or next of kin  Durable Power of Attorney or acting medical decision maker: Web designer    Discussion: We discussed goals of care for Travis [name] and his current condition now s/p splenectomy for hemorrhagic shock in setting of spontaneous splenic rupture. Now with rapidly worsening shock. In best case scenario he would face a likely prolonged recovery away from home if he survives this. Knowing this and his baseline comorbidities, Travis [name] feels that we are approaching the point where it's time to prioritize his comfort. He was made DNR and we will continue current supportive measures, infusions to attempt to allow family members nearby a chance to see him before transitioning to comfort measures only.   Code status: DNR  Disposition: Continue current acute care with transition to comfort measures only once family has arrived.  Time spent for the meeting: 20 minutes  Maryjane Hurter 01/06/2021, 1:19 PM   There is some family en route about 4 hours away. We will continue vasopressors in effort give time for them to see him before transition to comfort measures. Agreed to not give blood products however.  [place]

## 2021-01-06 NOTE — Progress Notes (Signed)
NAME:  Travis [name], MRN:  [id number], DOB:  [birth year], LOS: 5 ADMISSION DATE:  01/01/2021, CONSULTATION DATE:  01/01/2021 REFERRING MD:  Dr Georganna Skeans, CHIEF COMPLAINT:  abdominal pain   History of Present Illness:  [name]. Travis [name] is a 65 year old gentleman with an extended PMH that includes but not limited to CHF, CKD 3A, COPD, CRA O, TIA, diabetes, hypertension, gout, hyperlipidemia, CAD (nonobstructive). She presented to the ED on 01/01/21 with complaints of right lower quadrant abdominal pain. He was found to be pyrexial and tachycardiac. Work up included a CT scan of the abdomen and pelvis which revealed:  "Circumferential mucosal thickening and indistinctness of the bowel wall at the base of the appendix and the distal cecum at the ileocecal junction. There is an associated milder mucosal thickening and indistinctness of the terminal ileum. The distal appendix is decompressed, contains gas and fecal matter and overall has normal appearance. Small amount of free fluid and free gas in the right lower quadrant of the abdomen. These findings are likely due to acute appendicitis with perforation. It is unclear however whether the mucosal thickening of the cecum is reactive or is the source of appendiceal obstruction. Underlying malignancy such as mucinous tumor is in the differential diagnosis. 2. Small foci of free intraperitoneal gas and small amount of fluid scattered in the mesentery of the right lower and upper quadrants of the abdomen, and in perihepatic location."  Patient was therefore taken to the operating room where he was found to have a perforated appendix and abdominal abscess. He is S/P DIAGNOSTIC LAPAROSCOPY; EXPLORATORY LAPAROTOMY, ILECECECTOMY, DRAINAGE OF INTRA-ABDOMINAL ABSCESS  The patient is now in the ICU and on mechanical ventilation.  He arrived on Neosynephrine drip for vasopressor support and on IVF.  I have seen and examined the patient.  Pertinent   Medical History  CKD 3A COPD TIA DM  Significant Hospital Events: Including procedures, antibiotic start and stop dates in addition to other pertinent events   10/9: ex lap, remained intubated 10/10: extubated 10/12 transferred to [facility] 10/14 0130 hypotensive with worsening abd pain, surgery called and asked for repeat CT scan.  Started on Levophed.  Back to PCCM service  Interim History / Subjective:  Worsening abd pain and distention.  Surgery has asked for STAT CT. On 2 Levophed for HoTN despite fluids  Objective   Blood pressure 99/75, pulse (!) 128, temperature 98.7 F (37.1 C), temperature source Oral, resp. rate (!) 26, height 6' (1.829 m), weight 85.9 kg, SpO2 95 %.        Intake/Output Summary (Last 24 hours) at 01/06/2021 0147 Last data filed at 01/06/2021 0111 Gross per 24 hour  Intake 1198.68 ml  Output 1265 ml  Net -66.32 ml    Filed Weights   01/03/21 0321 01/04/21 0352 01/05/21 0328  Weight: 86.5 kg 84.4 kg 85.9 kg    Examination: General: Adult male, chronically ill appearing, in pain. HENT: Start/AT. MM moist.  EOMI. Lungs: CTAB. Cardiovascular: tachy, regular. Abdomen: Surgical dressings C/D/I.  Abd pain throughout, slightly distended. Extremities: no edema, no clubbing, no cyanosis. Neuro: Confused but does open eyes to voice, follows basic commands intermittently only, MAEs.    Assessment & Plan:   Septic shock - 2/2 peritonitis in setting perforated appendix and abdominal abscess.  S/p diagnostic laparoscopy, exploratory laparotomy, ileocecectomy, drainage of intra-abdominal abscess on 10/9.  10/14 early AM, worsening abd pain and HoTN, taken for repeat STAT CT. - Surgery following, appreciate assistance. -  STAT CT abd / pelv per surgery. - Continue Zosyn.  - Continue pain control but be cautious to avoid oversedation. - Continue Levophed as needed to maintain MAP > 65.  Acute hypoxic respiratory failure secondary to intra-abdominal sepsis. Hx  COPD. - Titrate oxygen to sat >92%. - Encourage pulm hygiene. - DuoNebs. - PT/OT.  Acute kidney injury secondary to ATN superimposed on CKD 3A - Continue fluids. - Monitor I/O's. - Follow BMP.  Hx of Type 2 DM. - SSI.   Hx of nonischemic cardiomyopathy, non obstructive CAD, HTN. - Continue to hold home meds.  Hx Gout - Hold home Allopurinol for now.   Back to PCCM service given new Levophed requirements. Surgery following up on STAT CT scan and decision on next steps based off of that.   Best Practice (right click and "Reselect all SmartList Selections" daily)   Diet/type: NPO DVT prophylaxis: SCD GI prophylaxis: H2B Lines: N/A Foley:  Yes, and it is no longer needed Code Status:  full code Last date of multidisciplinary goals of care discussion: with pt 10/11.   CC time: 30 min.   Montey Hora, [facility] For pager details, please see AMION or use Epic chat  After 1900, please call [facility] for cross coverage needs 01/06/2021, 1:59 AM

## 2021-01-06 NOTE — Progress Notes (Signed)
Nutrition Follow-up  DOCUMENTATION CODES:   Non-severe (moderate) malnutrition in context of chronic illness  INTERVENTION:   Given moderate malnutrition and increased nutrition needs, recommend begin nutrition support as soon as possible. Enteral nutrition via OG or Cortrak tube is preferable if gut is safe to use. (Cortrak service is available M-W-F.) Recommend Vital 1.5 at 20 ml/h, increase by 10 ml every 8 hours to goal rate of 60 ml/h with Prosource TF 45 ml TID to provide 2280 kcal, 130 gm protein, 1100 ml free water daily. If unable to begin enteral nutrition within the next 24 hours, recommend TPN.   NUTRITION DIAGNOSIS:   Moderate Malnutrition related to acute illness, chronic illness (perforated appendix, CAD, cardiomyopathy) as evidenced by mild muscle depletion, mild fat depletion.  Ongoing   GOAL:   Patient will meet greater than or equal to 90% of their needs  Unmet  MONITOR:   Vent status, Skin, I & O's, Labs  REASON FOR ASSESSMENT:   Rounds    ASSESSMENT:   65 yo male admitted with perforated appendix and abdominal abscess. S/P ex lap, ileocecectomy, drainage of intra-abdominal abscess. PMH includes CHF, CKD 3A, COPD, TIA, DM, HTN, gout, HLD, CAD, alcohol abuse, tobacco abuse.  Clear liquid diet initiated on the morning of 10/13. Patient had pulled out his NG tube the night before. He developed severe abdominal pain on the evening of 10/13. He required intubation. Stat CT abdomen found ruptured spleen. 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 VAC was also placed.   Discussed patient in ICU rounds and with RN today. Patient with increasing temp, HR, and pressor requirements this morning. No output from [facility] yet. RLQ drain with increased output, 370 ml x 24 hours. OG tube is currently to suction, ~100 ml in canister this morning.  Per discussion in rounds, would like to hold off on PICC placement today d/t increasing temp and pressor requirements. Hopeful to  begin EN soon pending input from Surgery team.   Patient is currently intubated on ventilator support MV: 12.2 L/min Temp (24hrs), Avg:99 F (37.2 C), Min:97.2 F (36.2 C), Max:100.4 F (38 C)   Labs reviewed.  CBG: 135-174  Medications reviewed and include Novolog SSI, Protonix, thiamine, IV antibiotics, fentanyl, levophed. Vasopressin and Precedex currently off.  Admission weight 86.8 kg (10/9)  Current weight 85.1 kg  I/O +6 L since admission  Diet Order:   Diet Order             Diet NPO time specified  Diet effective now                   EDUCATION NEEDS:   No education needs have been identified at this time  Skin:  Skin Assessment: Skin Integrity Issues: Skin Integrity Issues:: Wound VAC Wound Vac: abdomen  Last BM:  no BM documented  Height:   Ht Readings from Last 1 Encounters:  01/01/21 6' (1.829 m)    Weight:   Wt Readings from Last 1 Encounters:  01/06/21 85.1 kg    BMI:  Body mass index is 25.44 kg/m.  Estimated Nutritional Needs:   Kcal:  2200-2400  Protein:  120-140 gm  Fluid:  2.2-2.4 L    Lucas Mallow, RD, LDN, CNSC Please refer to Amion for contact information.

## 2021-01-07 ENCOUNTER — Inpatient Hospital Stay (HOSPITAL_COMMUNITY): Payer: Medicare HMO

## 2021-01-07 DIAGNOSIS — Z7189 Other specified counseling: Secondary | ICD-10-CM

## 2021-01-07 DIAGNOSIS — R571 Hypovolemic shock: Secondary | ICD-10-CM

## 2021-01-07 DIAGNOSIS — K3532 Acute appendicitis with perforation and localized peritonitis, without abscess: Secondary | ICD-10-CM | POA: Diagnosis not present

## 2021-01-07 DIAGNOSIS — Z515 Encounter for palliative care: Secondary | ICD-10-CM

## 2021-01-07 LAB — GLUCOSE, CAPILLARY
Glucose-Capillary: 149 mg/dL — ABNORMAL HIGH (ref 70–99)
Glucose-Capillary: 149 mg/dL — ABNORMAL HIGH (ref 70–99)
Glucose-Capillary: 136 mg/dL — ABNORMAL HIGH (ref 70–99)
Glucose-Capillary: 124 mg/dL — ABNORMAL HIGH (ref 70–99)
Glucose-Capillary: 138 mg/dL — ABNORMAL HIGH (ref 70–99)
Glucose-Capillary: 130 mg/dL — ABNORMAL HIGH (ref 70–99)

## 2021-01-07 LAB — PHOSPHORUS: Phosphorus: 5.3 mg/dL — ABNORMAL HIGH (ref 2.5–4.6)

## 2021-01-07 LAB — BPAM FFP
Blood Product Expiration Date: 202210192359
Blood Product Expiration Date: 202210192359
ISSUE DATE / TIME: 202210140541
ISSUE DATE / TIME: 202210140541
Unit Type and Rh: 5100
Unit Type and Rh: 5100

## 2021-01-07 LAB — MRSA NEXT GEN BY PCR, NASAL: MRSA by PCR Next Gen: NOT DETECTED

## 2021-01-07 LAB — BASIC METABOLIC PANEL
Anion gap: 14 (ref 5–15)
BUN: 36 mg/dL — ABNORMAL HIGH (ref 8–23)
CO2: 19 mmol/L — ABNORMAL LOW (ref 22–32)
Calcium: 7.9 mg/dL — ABNORMAL LOW (ref 8.9–10.3)
Chloride: 104 mmol/L (ref 98–111)
Creatinine, Ser: 3.01 mg/dL — ABNORMAL HIGH (ref 0.61–1.24)
GFR, Estimated: 22 mL/min — ABNORMAL LOW (ref >60)
Glucose, Bld: 178 mg/dL — ABNORMAL HIGH (ref 70–99)
Potassium: 4 mmol/L (ref 3.5–5.1)
Sodium: 137 mmol/L (ref 135–145)

## 2021-01-07 LAB — PREPARE FRESH FROZEN PLASMA

## 2021-01-07 LAB — ECHOCARDIOGRAM COMPLETE
AR max vel: 2.88 cm2
AV Area VTI: 2.61 cm2
AV Area mean vel: 2.63 cm2
AV Mean grad: 2 mmHg
AV Peak grad: 4.6 mmHg
Ao pk vel: 1.07 m/s
Calc EF: 52 %
Height: 72 in
S' Lateral: 3.5 cm
Single Plane A2C EF: 50.9 %
Single Plane A4C EF: 51 %
Weight: 3001.78 oz

## 2021-01-07 LAB — CULTURE, BLOOD (ROUTINE X 2): Special Requests: ADEQUATE

## 2021-01-07 LAB — CBC
HCT: 29.8 % — ABNORMAL LOW (ref 39.0–52.0)
Hemoglobin: 10.6 g/dL — ABNORMAL LOW (ref 13.0–17.0)
MCH: 30.6 pg (ref 26.0–34.0)
MCHC: 35.6 g/dL (ref 30.0–36.0)
MCV: 86.1 fL (ref 80.0–100.0)
Platelets: 248 10*3/uL (ref 150–400)
RBC: 3.46 MIL/uL — ABNORMAL LOW (ref 4.22–5.81)
RDW: 16.4 % — ABNORMAL HIGH (ref 11.5–15.5)
WBC: 14.9 10*3/uL — ABNORMAL HIGH (ref 4.0–10.5)
nRBC: 0.2 % (ref 0.0–0.2)

## 2021-01-07 LAB — COOXEMETRY PANEL
Carboxyhemoglobin: 1.1 % (ref 0.5–1.5)
Methemoglobin: 1 % (ref 0.0–1.5)
O2 Saturation: 51.7 %
Total hemoglobin: 9.4 g/dL — ABNORMAL LOW (ref 12.0–16.0)

## 2021-01-07 LAB — HEPATIC FUNCTION PANEL
ALT: 18 U/L (ref 0–44)
AST: 28 U/L (ref 15–41)
Albumin: 1.6 g/dL — ABNORMAL LOW (ref 3.5–5.0)
Alkaline Phosphatase: 64 U/L (ref 38–126)
Bilirubin, Direct: 1.5 mg/dL — ABNORMAL HIGH (ref 0.0–0.2)
Indirect Bilirubin: 1.1 mg/dL — ABNORMAL HIGH (ref 0.3–0.9)
Total Bilirubin: 2.6 mg/dL — ABNORMAL HIGH (ref 0.3–1.2)
Total Protein: 4.9 g/dL — ABNORMAL LOW (ref 6.5–8.1)

## 2021-01-07 LAB — MAGNESIUM: Magnesium: 2 mg/dL (ref 1.7–2.4)

## 2021-01-07 MED ORDER — VANCOMYCIN HCL 1500 MG/300ML IV SOLN: 1500.0000 mg | INTRAVENOUS | Status: DC

## 2021-01-07 MED ORDER — PIPERACILLIN-TAZOBACTAM 3.375 G IVPB
3.3750 g | Freq: Three times a day (TID) | INTRAVENOUS | Status: DC
  Administered 2021-01-07 – 2021-01-08 (×3): 3.375 g via INTRAVENOUS
  Filled 2021-01-07 (×4): qty 50

## 2021-01-07 MED ORDER — ACETAMINOPHEN 10 MG/ML IV SOLN
1000.0000 mg | Freq: Four times a day (QID) | INTRAVENOUS | Status: AC | PRN
Start: 2021-01-07 — End: 2021-01-08
  Administered 2021-01-07: 1000 mg via INTRAVENOUS
  Filled 2021-01-07: qty 100

## 2021-01-07 MED ORDER — LACTATED RINGERS IV BOLUS
500.0000 mL | Freq: Once | INTRAVENOUS | Status: AC
  Administered 2021-01-07: 500 mL via INTRAVENOUS

## 2021-01-07 MED ORDER — VANCOMYCIN HCL 1750 MG/350ML IV SOLN
1750.0000 mg | Freq: Once | INTRAVENOUS | Status: AC
  Administered 2021-01-07: 1750 mg via INTRAVENOUS
  Filled 2021-01-07: qty 350

## 2021-01-07 MED ORDER — VANCOMYCIN HCL 750 MG/150ML IV SOLN
750.0000 mg | INTRAVENOUS | Status: DC
  Administered 2021-01-08 – 2021-01-09 (×2): 750 mg via INTRAVENOUS
  Filled 2021-01-07 (×2): qty 150

## 2021-01-07 MED ORDER — PIPERACILLIN-TAZOBACTAM 3.375 G IVPB 30 MIN
3.3750 g | INTRAVENOUS | Status: AC
  Administered 2021-01-07: 3.375 g via INTRAVENOUS
  Filled 2021-01-07: qty 50

## 2021-01-07 MED ORDER — VANCOMYCIN HCL 750 MG/150ML IV SOLN
750.0000 mg | INTRAVENOUS | Status: DC
  Filled 2021-01-07: qty 150

## 2021-01-07 NOTE — Progress Notes (Signed)
NAME:  Travis [name], MRN:  [id number], DOB:  [birth year], LOS: 6 ADMISSION DATE:  01/01/2021, CONSULTATION DATE:  01/01/2021 REFERRING MD:  Dr Georganna Skeans, CHIEF COMPLAINT:  abdominal pain   History of Present Illness:  Travis [name] is a 65 year old gentleman with an extended PMH that includes but not limited to CHF, CKD 3A, COPD, CRA O, TIA, diabetes, hypertension, gout, hyperlipidemia, CAD (nonobstructive). She presented to the ED on 01/01/21 with complaints of right lower quadrant abdominal pain. He was found to be pyrexial and tachycardiac. Work up included a CT scan of the abdomen and pelvis which revealed:  "Circumferential mucosal thickening and indistinctness of the bowel wall at the base of the appendix and the distal cecum at the ileocecal junction. There is an associated milder mucosal thickening and indistinctness of the terminal ileum. The distal appendix is decompressed, contains gas and fecal matter and overall has normal appearance. Small amount of free fluid and free gas in the right lower quadrant of the abdomen. These findings are likely due to acute appendicitis with perforation. It is unclear however whether the mucosal thickening of the cecum is reactive or is the source of appendiceal obstruction. Underlying malignancy such as mucinous tumor is in the differential diagnosis. 2. Small foci of free intraperitoneal gas and small amount of fluid scattered in the mesentery of the right lower and upper quadrants of the abdomen, and in perihepatic location."  Patient was therefore taken to the operating room where he was found to have a perforated appendix and abdominal abscess. He is S/P DIAGNOSTIC LAPAROSCOPY; EXPLORATORY LAPAROTOMY, ILECECECTOMY, DRAINAGE OF INTRA-ABDOMINAL ABSCESS  The patient is now in the ICU and on mechanical ventilation.  He arrived on Neosynephrine drip for vasopressor support and on IVF.  I have seen and examined the patient.  Pertinent   Medical History  CKD 3A COPD TIA DM  Significant Hospital Events: Including procedures, antibiotic start and stop dates in addition to other pertinent events   10/9: ex lap, remained intubated 10/10: extubated 10/12 transferred to [facility] 10/14 0130 hypotensive with worsening abd pain, surgery called and asked for repeat CT scan.  Started on Levophed.  Back to PCCM service. 6U pRBC total, 4U FFP total. To OR for splenectomy.  Interim History / Subjective:  Sharp deterioration yesterday with increasing pressor requirement - concern for vasoplegia related to hemorrhagic shock vs septic shock with possible intraabdominal source. GoC discussion with family - see documentation from 10/14 - had anticipated transition to comfort measures today. Improvement overnight however with decreasing pressor requirement, with broadened ABX, crystalloid administration.   Objective   Blood pressure 139/87, pulse 77, temperature (!) 100.8 F (38.2 C), resp. rate 20, height 6' (1.829 m), weight 85.1 kg, SpO2 97 %. CVP:  [5 mmHg-11 mmHg] 9 mmHg  Vent Mode: PRVC FiO2 (%):  [60 %-100 %] 60 % Set Rate:  [20 bmp] 20 bmp Vt Set:  [620 mL] 620 mL PEEP:  [5 cmH20] 5 cmH20 Plateau Pressure:  [16 cmH20-18 cmH20] 16 cmH20   Intake/Output Summary (Last 24 hours) at 01/07/2021 0840 Last data filed at 01/07/2021 0656 Gross per 24 hour  Intake 3085.14 ml  Output 821.1 ml  Net 2264.04 ml   Filed Weights   01/04/21 0352 01/05/21 0328 01/06/21 0438  Weight: 84.4 kg 85.9 kg 85.1 kg    Examination: General appearance: 65 y.o., male, chronically ill-appearing Eyes: pupils equal, not tracking HENT: NCAT; dry MM Lungs: mech breath sounds, equal chest  rise CV: RRR no murmur Abdomen: Soft, non-tender; non-distended, hypoactive BS, mid-line incision with dressing in place Extremities: No peripheral edema, lukewarm-cool Neuro: opens eyes to voice and able to raise thumb to command  CBC stable  Assessment & Plan:    Shock: Presented with septic shock in setting ruptured appendix course c/b hemorrhagic shock from spontaneous splenic rupture 10/14 with deterioration post splenectomy either from vasoplegia related to hemorrhagic shock or possible intraabdominal infection. - vanc/zosyn 10/14- - fluid challenge today with LR - wean levo for MAP 65 - continue vaso - check coox - check TTE - trend cbc  Acute respiratory failure Acute encephalopathy Intubated in setting of TME, shock. 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 - SSI  Hx of nonischemic cardiomyopathy, non obstructive CAD, HTN. - Continue to hold home meds    Best Practice (right click and "Reselect all SmartList Selections" daily)   Diet/type: NPO DVT prophylaxis: SCD GI prophylaxis: H2B Lines: yes and it is still needed Foley:  Yes, and it is still needed Code Status:  DNR Last date of multidisciplinary goals of care discussion: with niece [name] 10/14, 10/15. In light of sharp deterioration on 10/14 we discussed plan for transition to comfort measures only while continuing vasopressors, crystalloids, ABX to allow family a chance to arrive to bedside prior to transition. Vasopressor requirement and mental status improved overnight, possibly with broadening ABX/crystalloid administration. Given good functional status by report from family prior to admission and improvement overnight, we'll continue all supportive measures but he remains DNR.   CC time: 35 min.   [facility]  01/07/2021, 8:40 AM

## 2021-01-07 NOTE — Progress Notes (Addendum)
Progress Note  1 Day Post-Op  Subjective: Intubated sedated. Sister in law at bedside.  Following commands and nodding yes/no to questions while on the vent.  Objective: Vital signs in last 24 hours: Temp:  [98.6 F (37 C)-102.9 F (39.4 C)] 100.8 F (38.2 C) (10/15 0700) Pulse Rate:  [77-128] 77 (10/15 0730) Resp:  [20-25] 20 (10/15 0730) BP: (75-139)/(56-94) 139/87 (10/15 0700) SpO2:  [83 %-100 %] 97 % (10/15 0730) Arterial Line BP: (81-150)/(58-83) 140/70 (10/15 0700) FiO2 (%):  [60 %-100 %] 60 % (10/15 0730) Last BM Date:  (pta)  Intake/Output from previous day: 10/14 0701 - 10/15 0700 In: 3085.1 [I.V.:1442.9; IV Piggyback:1642.2] Out: 852.3 [Urine:432.3; Emesis/NG output:350; Drains:70] Intake/Output this shift: No intake/output data recorded.  PE: General: WD, elderly appearing male, appears ill Heart: HR 70's  Lungs: ventilated respirations, CTAB Abd: soft, appropriately ttp, moderately distended, hypoactive BS, vac holding suction, drain with SS fluid,NGT with bilious drainage MS: all 4 extremities are symmetrical with no cyanosis, clubbing, or edema. Skin: warm and dry with no masses, lesions, or rashes   Lab Results:  Recent Labs    01/06/21 0815 01/06/21 0835 01/06/21 1533 01/07/21 0639  WBC 10.9*  --   --  14.9*  HGB 13.5  --  12.2* 10.6*  HCT 39.5  --  36.0* 29.8*  PLT 171 185  --  248   BMET Recent Labs    01/06/21 0308 01/06/21 0408 01/06/21 0815 01/06/21 1533  NA 138   < > 136 137  K 4.5   < > 4.1 4.3  CL 104  --  106  --   CO2 22  --  20*  --   GLUCOSE 199*  --  223*  --   BUN 22  --  24*  --   CREATININE 1.77*  --  1.54*  --   CALCIUM 8.1*  --  7.5*  --    < > = values in this interval not displayed.   PT/INR Recent Labs    01/06/21 0308 01/06/21 0835  LABPROT 16.0* 15.5*  INR 1.3* 1.2   CMP     Component Value Date/Time   NA 137 01/06/2021 1533   NA 138 06/20/2020 1034   NA 141 04/20/2014 1019   K 4.3 01/06/2021  1533   K 3.7 04/20/2014 1019   CL 106 01/06/2021 0815   CL 106 04/20/2014 1019   CO2 20 (L) 01/06/2021 0815   CO2 30 04/20/2014 1019   GLUCOSE 223 (H) 01/06/2021 0815   GLUCOSE 102 (H) 04/20/2014 1019   BUN 24 (H) 01/06/2021 0815   BUN 18 06/20/2020 1034   BUN 14 04/20/2014 1019   CREATININE 1.54 (H) 01/06/2021 0815   CREATININE 1.18 04/20/2014 1019   CALCIUM 7.5 (L) 01/06/2021 0815   CALCIUM 9.3 04/20/2014 1019   PROT 4.3 (L) 01/06/2021 0815   PROT 6.5 06/18/2017 1425   PROT 7.4 04/20/2014 1019   ALBUMIN 1.8 (L) 01/06/2021 0815   ALBUMIN 4.0 06/18/2017 1425   ALBUMIN 3.4 04/20/2014 1019   AST 26 01/06/2021 0815   AST 21 04/20/2014 1019   ALT 14 01/06/2021 0815   ALT 20 04/20/2014 1019   ALKPHOS 66 01/06/2021 0815   ALKPHOS 97 04/20/2014 1019   BILITOT 4.2 (H) 01/06/2021 0815   BILITOT 0.4 06/18/2017 1425   BILITOT 0.6 04/20/2014 1019   GFRNONAA 50 (L) 01/06/2021 0815   GFRNONAA >60 04/20/2014 1019   GFRNONAA >60 11/19/2012 1107  GFRAA 53 (L) 01/13/2020 1410   GFRAA >60 04/20/2014 1019   GFRAA >60 11/19/2012 1107   Lipase     Component Value Date/Time   LIPASE 31 01/01/2021 1023   LIPASE 112 04/20/2014 1019       Studies/Results: CT ABDOMEN PELVIS WO CONTRAST  Result Date: 01/06/2021 CLINICAL DATA:  Intra-abdominal abscess. EXAM: CT ABDOMEN AND PELVIS WITHOUT CONTRAST TECHNIQUE: Multidetector CT imaging of the abdomen and pelvis was performed following the standard protocol without IV contrast. COMPARISON:  01/01/2021 FINDINGS: Lower chest: Small bilateral pleural effusions. Atelectasis or consolidation in both lung bases. Mild cardiac enlargement. Small pericardial effusions. Scattered mediastinal lymph nodes are not pathologically enlarged. Enteric tube in the esophagus. Hepatobiliary: No focal liver lesions. Stone in the gallbladder. No gallbladder wall thickening or edema. No bile duct dilatation. Pancreas: Unremarkable. No pancreatic ductal dilatation or  surrounding inflammatory changes. Spleen: Heterogeneous appearance of the spleen with mixed hypo and hyperechoic appearance consistent with splenic hematoma or rupture. Transverse diameter the hematoma is about 14.3 x 11.8 cm. Moderate amount of free fluid throughout the abdomen and pelvis with increased density suggesting hemorrhage, likely related to the splenic injury. Adrenals/Urinary Tract: No adrenal gland nodules. Kidneys are symmetrical. No hydronephrosis or hydroureter. Bladder is partially obscured by streak artifact but appears grossly unremarkable. Stomach/Bowel: Stomach, small bowel, and colon are not abnormally distended. An enteric tube is present with tip in the distal stomach. Since the prior study, there has been interval postoperative change with apparent partial colectomy and ileocolonic anastomosis. Stranding around the area of the anastomosis is likely postoperative or inflammatory. No loculated collections. There is wall thickening in the pelvic loops of the terminal ileum, likely representing reactive inflammation. Enteritis of nonspecific cause would be another possibility. There is a duodenal diverticulum. Vascular/Lymphatic: Calcification of the aorta. No aneurysm. Scattered lymph nodes are present without pathologic enlargement, likely reactive. Reproductive: Prostate gland is not enlarged. Other: Free fluid in the upper abdomen and extending into the pelvis. Fluid has increased density with heterogeneous layering consistent with hemorrhagic fluid. No loculated collections. Small amount of free air in the abdomen is likely postoperative. A right lower quadrant surgical drain remains present. Open incision along the midline of the abdomen. Musculoskeletal: Degenerative changes in the spine. No displaced rib fractures are identified. Postoperative right hip arthroplasty with heterotopic ossification. IMPRESSION: 1. Apparent splenic rupture with large splenic hematoma and free fluid  consistent with hemorrhage in the abdomen and pelvis. 2. Interval postoperative changes with resection of the cecum and ileal colonic anastomosis. No loculated collections. 3. Lower abdominal bowel wall thickening likely representing reactive inflammation. Enteritis would be a secondary consideration. 4. Free air in the abdomen consistent with recent surgery. Surgical drain remains present. 5. Cholelithiasis without evidence of acute cholecystitis. 6. Small bilateral pleural effusions with consolidation in both lung bases. Critical Value/emergent results were called by telephone at the time of interpretation on 01/06/2021 at 2:22 am to provider [facility] , who verbally acknowledged these results. Electronically Signed   By: Lucienne Capers M.D.   On: 01/06/2021 02:27   Portable Chest x-ray  Result Date: 01/06/2021 CLINICAL DATA:  Postoperative ileus, respiratory failure EXAM: PORTABLE CHEST 1 VIEW COMPARISON:  01/01/2021 FINDINGS: Endotracheal tube is seen 3.2 cm above the carina. Nasogastric tube extends into the upper abdomen beyond the margin of the examination. Left subclavian central venous catheter tip noted within the superior vena cava. Lung volumes are small. Lungs are clear. No pneumothorax or pleural effusion. Cardiac size  within normal limits. Pulmonary vascularity is normal. IMPRESSION: Support tubes in appropriate position. Pulmonary hypoinflation. Electronically Signed   By: Fidela Salisbury M.D.   On: 01/06/2021 03:54   DG Abd Portable 1V  Result Date: 01/06/2021 CLINICAL DATA:  Postoperative ileus. EXAM: PORTABLE ABDOMEN - 1 VIEW COMPARISON:  None. FINDINGS: Nasogastric tube tip noted within the expected mid body of the stomach. Multiple gas-filled mildly dilated loops of small and large bowel are again identified most in keeping with a adynamic ileus. Surgical drain overlies the mid pelvis. No gross free intraperitoneal gas. Right hip bipolar hemiarthroplasty has been performed.  IMPRESSION: Nasogastric tube within the mid body of the stomach. Moderate adynamic ileus. Electronically Signed   By: Fidela Salisbury M.D.   On: 01/06/2021 03:56   DG Abd Portable 1V  Result Date: 01/06/2021 CLINICAL DATA:  Nasogastric tube placement EXAM: PORTABLE ABDOMEN - 1 VIEW COMPARISON:  01/05/2021 FINDINGS: Enteric tube is present with tip coiled in the upper mid abdomen consistent with location in the body of the stomach. Persistent finding of gas-filled mildly dilated small bowel likely indicating small bowel obstruction. Atelectasis or infiltration in the lung bases. IMPRESSION: Enteric tube tip is in the upper mid abdomen consistent with location in the body of the stomach. Gaseous distention of small bowel, likely obstruction. Electronically Signed   By: Lucienne Capers M.D.   On: 01/06/2021 00:32   DG Abd Portable 1V  Result Date: 01/05/2021 CLINICAL DATA:  Enteric catheter adjustment EXAM: PORTABLE ABDOMEN - 1 VIEW COMPARISON:  01/04/2021 FINDINGS: Frontal view of the lower chest and upper abdomen demonstrates enteric catheter passing below diaphragm catheter coiled over the gastric body. Persistent gaseous distention of the small bowel left mid abdomen. Chronic elevation of the right hemidiaphragm. IMPRESSION: 1. Enteric catheter coiled over the gastric body. 2. Persistent gaseous distention of the small bowel. Electronically Signed   By: Randa Ngo M.D.   On: 01/05/2021 22:57    Anti-infectives: Anti-infectives (From admission, onward)    Start     Dose/Rate Route Frequency Ordered Stop   01/08/21 0600  vancomycin (VANCOREADY) IVPB 1500 mg/300 mL        1,500 mg 150 mL/hr over 120 Minutes Intravenous Every 24 hours 01/07/21 0430     01/07/21 1400  piperacillin-tazobactam (ZOSYN) IVPB 3.375 g        3.375 g 12.5 mL/hr over 240 Minutes Intravenous Every 8 hours 01/07/21 0430     01/07/21 0530  piperacillin-tazobactam (ZOSYN) IVPB 3.375 g        3.375 g 100 mL/hr over 30  Minutes Intravenous STAT 01/07/21 0430 01/07/21 0622   01/07/21 0530  vancomycin (VANCOREADY) IVPB 1750 mg/350 mL        1,750 mg 175 mL/hr over 120 Minutes Intravenous  Once 01/07/21 0430     01/05/21 1430  Ampicillin-Sulbactam (UNASYN) 3 g in sodium chloride 0.9 % 100 mL IVPB        3 g 200 mL/hr over 30 Minutes Intravenous Every 6 hours 01/05/21 1334 01/06/21 1552   01/01/21 2200  piperacillin-tazobactam (ZOSYN) IVPB 3.375 g        3.375 g 12.5 mL/hr over 240 Minutes Intravenous Every 8 hours 01/01/21 1841 01/04/21 2359   01/01/21 2100  piperacillin-tazobactam (ZOSYN) IVPB 3.375 g  Status:  Discontinued        3.375 g 100 mL/hr over 30 Minutes Intravenous Every 8 hours 01/01/21 1836 01/01/21 1841   01/01/21 1345  piperacillin-tazobactam (ZOSYN) IVPB 3.375 g  3.375 g 100 mL/hr over 30 Minutes Intravenous  Once 01/01/21 1337 01/01/21 1430        Assessment/Plan Perforated appendicitis with abscess POD6 s/p exploratory laparotomy, ileocecectomy, drainage of intraabdominal abscess 01/01/21 Dr. Grandville Silos - NGT with bilious drainage, post-op ileus  - Cxs w/ pansensitive E.Coli, continue IV Zosyn  - drain SS this AM, continue drain - VAC change M/W/F to midline - mobilize with PT/ OT   Splenic laceration  POD1 s/p ex lap, splenectomy, application of incisional wound vac 10/14. Dr. Bobbye Morton  - follow H&H, 10.6/29.8 from 12/36 yesterday  - post-op ileus as above  FEN: NPO, IVF, NGT LIWS VTE: start SQH today  ID: Zosyn 10/9>> Dispo: family meeting with palliative care today to discuss GOC, CCS will follow outcome of this discussion, patient is improving, pressor requirements decreasing. On supportive care. Await ROBF.  Below per primary team: hemorrhagic shock 2/2 above s/p 6 u PRBC, 2 u FFP 10/14 Septic shock 2/2 perforated appendicitis  EtOH abuse Alcohol related cardiomyopathy T2DM Chronic anemia Hx of TIA HTN HLD Gout Hx of GIB CAD Tobacco abuse   LOS: 6 days     Jill Alexanders, [facility] 01/07/2021, 7:57 AM Please see Amion for pager number during day hours 7:00am-4:30pm

## 2021-01-07 NOTE — Progress Notes (Signed)
  Echocardiogram 2D Echocardiogram has been performed.  Travis [name] F 01/07/2021, 10:34 AM

## 2021-01-07 NOTE — Progress Notes (Signed)
OT Cancellation Note  Patient Details Name: BOWEN MIJARES MRN: [id number] DOB: [birth year]   Cancelled Treatment:    Reason Eval/Treat Not Completed: Medical issues which prohibited therapy (pt s/p splenic rupture with splenectomy currently on vent 60% and per chart no longer medically appropriate for therapy. will sign off)  Jolaine Artist, OT Acute Rehabilitation Services Pager [phone] Office [phone]   Delight Stare 01/07/2021, 7:51 AM

## 2021-01-07 NOTE — Consult Note (Signed)
Palliative Medicine Inpatient Consult Note  Consulting Provider: Delilah Shan, RN  Reason for consult:   [facility] Palliative Medicine Consult  Reason for Consult? Pt is needing increasing pressor/vent support, 2 major abdominal surgeries. RN and MD have spoken with family multiple times about comfort care but family is still having some trouble processing everything. Thank you!    HPI:  Per intake H&P --> [name]. Travis [name] is a 65 year old gentleman with an extended PMH that includes but not limited to CHF, CKD 3A, COPD, CRA O, TIA, diabetes, hypertension, gout, hyperlipidemia, CAD (nonobstructive). He presented to the ED on 01/01/21 with complaints of right lower quadrant abdominal pain --> taken to the operating room where he was found to have a perforated appendix and abdominal abscess. 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 Now with rapidly worsening shock. Palliative care was asked to get involved to further address goals of care in the setting of overall critical illness and ongoing declining state.   Clinical Assessment/Goals of Care:  *Please note that this is a verbal dictation therefore any spelling or grammatical errors are due to the "[place]" system interpretation.  I have reviewed medical records including EPIC notes, labs and imaging, received report from bedside RN, assessed the patient who is intubated though able to participate by squeezing my hand.    I met with patient's niece Travis [name] and sister-in-law Travis [name] to further discuss diagnosis prognosis, GOC, EOL wishes, disposition and options.  A review of bruises acute hospitalization was had.  We discussed that he is endured a tremendous amount since hospitalization.  We recounted that he has suffered a perforated appendix requiring an abdominal surgery and also a ruptured spleen causing him to be in shock.  We reviewed that at this time it does  appear that he is improving as his pressors have been reduced.  This is a pretty notable change as compared to yesterday.  I introduced Palliative Medicine as specialized medical care for people living with serious illness. It focuses on providing relief from the symptoms and stress of a serious illness. The goal is to improve quality of life for both the patient and the family.  Travis [name] is from [place], [place].  He has never been married.  He has 1 son who is incarcerated.  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  He is identified to be a very social man and has "a lots of friends".  He is a beloved member of his community and used to offer rides to locals in an effort to help them.  He is a very faithful man and a member of Travis [name] denomination.  He also taught missionary Bible study.  Prior to hospitalization Travis [name] was fiercely independent.  He was living on his own in the [place] area and able to perform all bADLs and IADLs without assistance.  He was still driving.  He did not require any functional aids for mobility.  Patient's family endorses how difficult it is to see him in this clinical state.  They shared that yesterday they were less than optimistic about improvements though they do if file glimmer of hope after speaking to the surgical team.  I asked what the goals of the family are at this time and Travis [name] shares they want to see if Travis [name] can improve.  I was able to asked Travis [name] to participate in the conversation through squeezing my hand.  I  asked Travis [name] if he wanted [place] to continue with the current care to see if improvements could be made.  He was able to appropriately squeeze my hand to yes and no inconsistently squeeze my hand yes when asked if he wanted [place] to continue with the current level of care.  Travis [name] expresses some generalized grief over Travis [name]'s present condition.  She shares that it might be selfish though she is not ready to let him go and she would like to  continue with Sunday family dinners in the future.  A review of bruises tenuous clinical state was had.  I shared that my role as a palliative care provider is to be open and honest with patients and their families when they are in positions such as Travis [name] is in.  I shared that our goal is to ultimately respect what patients and families want to further loved ones and if presently Travis [name] wants to continue to see if improvements can be made that is what we will support.  A detailed discussion was had today regarding advanced directives, patient does not have any on file though his niece Travis [name] is his primary Media planner.    Concepts specific to code status, artifical feeding and hydration, continued IV antibiotics and rehospitalization was had.  Travis [name] is a DO NOT RESUSCITATE CODE STATUS.   The goals for the time being are to see if clinical improvements can to continue to be made.  Discussed the importance of continued conversation with family and their  medical providers regarding overall plan of care and treatment options, ensuring decisions are within the context of the patients values and GOCs.  Decision Maker: Travis [name] (niece) [phone]  SUMMARY OF RECOMMENDATIONS   DNAR  Continue current level of care to identify if improvements can be made  Ongoing open and honest dialogue regarding Travis [name]'s clinical condition  Patient is a member of [employer], will request chaplain support  Palliative care will continue to support Travis [name] and his family during this difficult time  Code Status/Advance Care Planning: DNAR    Palliative Prophylaxis:  Oral care, turn every 2 hours, delirium precautions, pain management  Additional Recommendations (Limitations, Scope, Preferences): Continue current level of care   Psycho-social/Spiritual:  Desire for further Chaplaincy support: Yes Additional Recommendations: Education on clinical disease trajectory   Prognosis: Unclear presently    Discharge Planning: Discharge plan is to be determined depending upon clinical progression  Vitals:   01/07/21 0545 01/07/21 0600  BP:  121/85  Pulse: 94 93  Resp: 20 20  Temp: (!) 101.5 F (38.6 C) (!) 101.5 F (38.6 C)  SpO2: 97% 98%    Intake/Output Summary (Last 24 hours) at 01/07/2021 3524 Last data filed at 01/07/2021 0600 Gross per 24 hour  Intake 3785.14 ml  Output 462.3 ml  Net 3322.84 ml   Last Weight  Most recent update: 01/06/2021  4:39 AM    Weight  85.1 kg (187 lb 9.8 oz)            Gen: Very ill appearing AA M HEENT: ETT, core track CV: Regular rate and rhythm  PULM: Mechanical vent ABD: soft/nontender  EXT: No edema  Neuro: Oriented, able to squeeze hand to yes/no questions  PPS: 10%   This conversation/these recommendations were discussed with patient primary care team, Dr. Verlee Monte  Time In: 0817 Time Out: 0927 Total Time: 70 Greater than 50%  of this time was spent counseling and coordinating care related to the above assessment and plan.  [place] Team Team Cell Phone: [phone] Please utilize secure chat with additional questions, if there is no response within 30 minutes please call the above phone number  Palliative Medicine Team providers are available by phone from 7am to 7pm daily and can be reached through the team cell phone.  Should this patient require assistance outside of these hours, please call the patient's attending physician.

## 2021-01-07 NOTE — Progress Notes (Signed)
PHARMACY NOTE:  ANTIMICROBIAL RENAL DOSAGE ADJUSTMENT  Current antimicrobial regimen includes a mismatch between antimicrobial dosage and estimated renal function.  As per policy approved by the Pharmacy & Therapeutics and Medical Executive Committees, the antimicrobial dosage will be adjusted accordingly.  Current antimicrobial dosage:  Vancomycin '1500mg'$  q 24h  Indication: sepsis  Renal Function:  Estimated Creatinine Clearance: 27.2 mL/min (A) (by C-G formula based on SCr of 3.01 mg/dL (H)).  Antimicrobial dosage has been changed to:   Vancomycin '750mg'$  q 24h (eAUC 453, Scr 3.01)  Will continue to follow and adjust as needed.  Thank you for allowing pharmacy to be a part of this patient's care.  Levonne Spiller, [facility] 01/07/2021 12:08 PM

## 2021-01-07 NOTE — Progress Notes (Signed)
PT Cancellation Note  Patient Details Name: Travis [name] MRN: [id number] DOB: November 08, 1955   Cancelled Treatment:    Reason Eval/Treat Not Completed: Medical issues which prohibited therapy (pt s/p splenic rupture with splenectomy currently on vent 60% and per chart no longer medically appropriate for therapy. will sign off)   [name] B [name] 01/07/2021, 7:30 AM [name], PT Acute Rehabilitation Services Pager: [phone] Office: [phone]

## 2021-01-07 NOTE — Progress Notes (Signed)
Pharmacy Antibiotic Note  Travis [name] is a 65 y.o. male admitted on 01/01/2021 with sepsis.  Pharmacy has been consulted for Vancomycin and Zosyn dosing. Tm 102.7.   Plan: Zosyn 3.'375mg'$  IV q8h Vancomycin '1750mg'$  IV now then 1500 mg IV Q 24 hrs. Goal AUC 400-550. Expected AUC: 504 SCr used: 1.54 Will f/u renal function, micro data, and pt's clinical condition Vanc levels prn   Height: 6' (182.9 cm) Weight: 85.1 kg (187 lb 9.8 oz) IBW/kg (Calculated) : 77.6  Temp (24hrs), Avg:101.3 F (38.5 C), Min:98.6 F (37 C), Max:102.9 F (39.4 C)  Recent Labs  Lab 01/01/21 1927 01/02/21 0321 01/03/21 0304 01/04/21 0140 01/04/21 0420 01/04/21 [id number] 01/05/21 0211 01/06/21 0124 01/06/21 0308 01/06/21 0815 01/06/21 1128  WBC  --  7.0  --   --   --  9.0 8.9 13.3*  --  10.9*  --   CREATININE  --  2.01*   < > 1.90*  --   --  1.43* 1.83* 1.77* 1.54*  --   LATICACIDVEN 1.6  --   --   --  0.8  --   --  2.7* 2.1*  --  1.9   < > = values in this interval not displayed.    Estimated Creatinine Clearance: 53.2 mL/min (A) (by C-G formula based on SCr of 1.54 mg/dL (H)).    No Known Allergies  Antimicrobials this admission: 10/15 Vanc >>  10/9>> Zosyn >>10/12; restarted 10/14>> 10/13>10/14 Unasyn  Microbiology results: 10/9 BCx: 1/2 GNR 10/9 abd fluid: Ecoli, enterococcus durans 10/10 UCx:  10/15 BCx:  Thank you for allowing pharmacy to be a part of this patient's care.  Sherlon Handing, PharmD, BCPS Please see amion for complete clinical pharmacist phone list 01/07/2021 4:22 AM

## 2021-01-08 ENCOUNTER — Inpatient Hospital Stay (HOSPITAL_COMMUNITY): Payer: Medicare HMO

## 2021-01-08 DIAGNOSIS — K3532 Acute appendicitis with perforation and localized peritonitis, without abscess: Secondary | ICD-10-CM | POA: Diagnosis not present

## 2021-01-08 LAB — COMPREHENSIVE METABOLIC PANEL
ALT: 17 U/L (ref 0–44)
AST: 22 U/L (ref 15–41)
Albumin: <1.5 g/dL — ABNORMAL LOW (ref 3.5–5.0)
Alkaline Phosphatase: 60 U/L (ref 38–126)
Anion gap: 13 (ref 5–15)
BUN: 48 mg/dL — ABNORMAL HIGH (ref 8–23)
CO2: 19 mmol/L — ABNORMAL LOW (ref 22–32)
Calcium: 8 mg/dL — ABNORMAL LOW (ref 8.9–10.3)
Chloride: 106 mmol/L (ref 98–111)
Creatinine, Ser: 2.74 mg/dL — ABNORMAL HIGH (ref 0.61–1.24)
GFR, Estimated: 25 mL/min — ABNORMAL LOW (ref >60)
Glucose, Bld: 158 mg/dL — ABNORMAL HIGH (ref 70–99)
Potassium: 3.7 mmol/L (ref 3.5–5.1)
Sodium: 138 mmol/L (ref 135–145)
Total Bilirubin: 2.5 mg/dL — ABNORMAL HIGH (ref 0.3–1.2)
Total Protein: 4.9 g/dL — ABNORMAL LOW (ref 6.5–8.1)

## 2021-01-08 LAB — CBC WITH DIFFERENTIAL/PLATELET
Abs Immature Granulocytes: 0 10*3/uL (ref 0.00–0.07)
Basophils Absolute: 0 10*3/uL (ref 0.0–0.1)
Basophils Relative: 0 %
Eosinophils Absolute: 0 10*3/uL (ref 0.0–0.5)
Eosinophils Relative: 0 %
HCT: 24.4 % — ABNORMAL LOW (ref 39.0–52.0)
Hemoglobin: 8.3 g/dL — ABNORMAL LOW (ref 13.0–17.0)
Lymphocytes Relative: 6 %
Lymphs Abs: 1 10*3/uL (ref 0.7–4.0)
MCH: 30.5 pg (ref 26.0–34.0)
MCHC: 34 g/dL (ref 30.0–36.0)
MCV: 89.7 fL (ref 80.0–100.0)
Monocytes Absolute: 0.9 10*3/uL (ref 0.1–1.0)
Monocytes Relative: 5 %
Neutro Abs: 15.5 10*3/uL — ABNORMAL HIGH (ref 1.7–7.7)
Neutrophils Relative %: 89 %
Platelets: 248 10*3/uL (ref 150–400)
RBC: 2.72 MIL/uL — ABNORMAL LOW (ref 4.22–5.81)
RDW: 16.6 % — ABNORMAL HIGH (ref 11.5–15.5)
WBC: 17.4 10*3/uL — ABNORMAL HIGH (ref 4.0–10.5)
nRBC: 0 /100 WBC
nRBC: 0.2 % (ref 0.0–0.2)

## 2021-01-08 LAB — GLUCOSE, CAPILLARY
Glucose-Capillary: 130 mg/dL — ABNORMAL HIGH (ref 70–99)
Glucose-Capillary: 142 mg/dL — ABNORMAL HIGH (ref 70–99)
Glucose-Capillary: 130 mg/dL — ABNORMAL HIGH (ref 70–99)
Glucose-Capillary: 110 mg/dL — ABNORMAL HIGH (ref 70–99)
Glucose-Capillary: 128 mg/dL — ABNORMAL HIGH (ref 70–99)
Glucose-Capillary: 151 mg/dL — ABNORMAL HIGH (ref 70–99)

## 2021-01-08 MED ORDER — ACETAMINOPHEN 10 MG/ML IV SOLN
1000.0000 mg | Freq: Four times a day (QID) | INTRAVENOUS | Status: DC | PRN
Start: 2021-01-08 — End: 2021-01-09
  Administered 2021-01-08: 1000 mg via INTRAVENOUS
  Filled 2021-01-08 (×2): qty 100

## 2021-01-08 MED ORDER — ORAL CARE MOUTH RINSE
15.0000 mL | Freq: Two times a day (BID) | OROMUCOSAL | Status: DC
  Administered 2021-01-08 – 2021-02-09 (×59): 15 mL via OROMUCOSAL

## 2021-01-08 MED ORDER — TRAVASOL 10 % IV SOLN
INTRAVENOUS | Status: AC
  Filled 2021-01-08: qty 547.2

## 2021-01-08 MED ORDER — METRONIDAZOLE 500 MG/100ML IV SOLN
500.0000 mg | Freq: Two times a day (BID) | INTRAVENOUS | Status: DC
  Administered 2021-01-08 – 2021-01-09 (×2): 500 mg via INTRAVENOUS
  Filled 2021-01-08 (×2): qty 100

## 2021-01-08 MED ORDER — SODIUM CHLORIDE 0.9 % IV SOLN
2.0000 g | INTRAVENOUS | Status: DC
  Administered 2021-01-08: 2 g via INTRAVENOUS
  Filled 2021-01-08: qty 2

## 2021-01-08 MED ORDER — FENTANYL CITRATE (PF) 100 MCG/2ML IJ SOLN
50.0000 ug | INTRAMUSCULAR | Status: AC | PRN
  Administered 2021-01-08 – 2021-01-09 (×6): 50 ug via INTRAVENOUS
  Filled 2021-01-08 (×6): qty 2

## 2021-01-08 NOTE — Progress Notes (Signed)
Pt self extubated at 1616 despite sedation and mitts. He stated he pulled the tube because he was scared. His VS remained stable throughout the event, breath sounds are clear and diminished. On [place] now with sats 95-98%. Dr. Verlee Monte assessed at bedside. Family updated about patient status.

## 2021-01-08 NOTE — Progress Notes (Addendum)
Progress Note  2 Days Post-Op  Subjective: Intubated, sedated. No family at bedside.  Following commands and nodding yes/no to questions while on the vent.  Pressor requirements decreasing No bowel function overnight per RN Objective: Vital signs in last 24 hours: Temp:  [99.3 F (37.4 C)-102.9 F (39.4 C)] 99.3 F (37.4 C) (10/16 0800) Pulse Rate:  [72-270] 76 (10/16 0800) Resp:  [18-27] 18 (10/16 0800) BP: (92-127)/(64-86) 112/72 (10/16 0800) SpO2:  [60 %-97 %] 95 % (10/16 0800) Arterial Line BP: (88-132)/(47-64) 117/56 (10/16 0800) FiO2 (%):  [50 %-60 %] 60 % (10/16 0800) Weight:  [91.2 kg] 91.2 kg (10/16 0356) Last BM Date: 01/08/21  Intake/Output from previous day: 10/15 0701 - 10/16 0700 In: 1286.4 [P.O.:150; I.V.:1027.8; IV Piggyback:108.6] Out: 965 [Urine:745; Emesis/NG output:200; Drains:20] Intake/Output this shift: Total I/O In: 49.4 [I.V.:36.9; IV Piggyback:12.5] Out: -   PE: General: WD, elderly appearing male, appears ill Heart: HR 70's, regular rhythm Lungs: ventilated respirations, Rhonchi bilaterally, no crackles or wheezes Vent Mode: PRVC FiO2 (%):  [50 %-60 %] 60 % Set Rate:  [20 bmp] 20 bmp Vt Set:  [id number] mL] 620 mL PEEP:  [5 cmH20] 5 cmH20 Plateau Pressure:  [14 cmH20-17 cmH20] 16 cmH20  Abd: soft, appropriately ttp, moderately distended, hypoactive BS, vac holding suction, drain with SS fluid,NGT with bilious drainage - 200-300 cc/24h MS: all 4 extremities are symmetrical with no cyanosis, clubbing, or edema. Skin: warm and dry with no masses, lesions, or rashes  Lab Results:  Recent Labs    01/07/21 0639 01/08/21 0446  WBC 14.9* 17.4*  HGB 10.6* 8.3*  HCT 29.8* 24.4*  PLT 248 248   BMET Recent Labs    01/07/21 0639 01/08/21 0446  NA 137 138  K 4.0 3.7  CL 104 106  CO2 19* 19*  GLUCOSE 178* 158*  BUN 36* 48*  CREATININE 3.01* 2.74*  CALCIUM 7.9* 8.0*   PT/INR Recent Labs    01/06/21 0308 01/06/21 0835  LABPROT 16.0*  15.5*  INR 1.3* 1.2   CMP     Component Value Date/Time   NA 138 01/08/2021 0446   NA 138 06/20/2020 1034   NA 141 04/20/2014 1019   K 3.7 01/08/2021 0446   K 3.7 04/20/2014 1019   CL 106 01/08/2021 0446   CL 106 04/20/2014 1019   CO2 19 (L) 01/08/2021 0446   CO2 30 04/20/2014 1019   GLUCOSE 158 (H) 01/08/2021 0446   GLUCOSE 102 (H) 04/20/2014 1019   BUN 48 (H) 01/08/2021 0446   BUN 18 06/20/2020 1034   BUN 14 04/20/2014 1019   CREATININE 2.74 (H) 01/08/2021 0446   CREATININE 1.18 04/20/2014 1019   CALCIUM 8.0 (L) 01/08/2021 0446   CALCIUM 9.3 04/20/2014 1019   PROT 4.9 (L) 01/08/2021 0446   PROT 6.5 06/18/2017 1425   PROT 7.4 04/20/2014 1019   ALBUMIN <1.5 (L) 01/08/2021 0446   ALBUMIN 4.0 06/18/2017 1425   ALBUMIN 3.4 04/20/2014 1019   AST 22 01/08/2021 0446   AST 21 04/20/2014 1019   ALT 17 01/08/2021 0446   ALT 20 04/20/2014 1019   ALKPHOS 60 01/08/2021 0446   ALKPHOS 97 04/20/2014 1019   BILITOT 2.5 (H) 01/08/2021 0446   BILITOT 0.4 06/18/2017 1425   BILITOT 0.6 04/20/2014 1019   GFRNONAA 25 (L) 01/08/2021 0446   GFRNONAA >60 04/20/2014 1019   GFRNONAA >60 11/19/2012 1107   GFRAA 53 (L) 01/13/2020 1410   GFRAA >60 04/20/2014 1019  GFRAA >60 11/19/2012 1107   Lipase     Component Value Date/Time   LIPASE 31 01/01/2021 1023   LIPASE 112 04/20/2014 1019       Studies/Results: DG CHEST PORT 1 VIEW  Result Date: 01/07/2021 CLINICAL DATA:  Shortness of breath EXAM: PORTABLE CHEST 1 VIEW COMPARISON:  Yesterday FINDINGS: Endotracheal tube terminates 4.4 cm above carina. Nasogastric tube terminates at the proximal stomach. Left-sided subclavian line tip at mid SVC. Right shoulder arthroplasty. Normal heart size. Probable tiny left pleural effusion, similar. No pneumothorax. Suspect mild pulmonary venous congestion. Extremely low lung volumes with right hemidiaphragm elevation and bibasilar atelectasis. IMPRESSION: No significant change since one day prior.  Similar appearance of right hemidiaphragm elevation and bibasilar atelectasis. Trace left pleural fluid. Electronically Signed   By: Abigail Miyamoto M.D.   On: 01/07/2021 11:07   ECHOCARDIOGRAM COMPLETE  Result Date: 01/07/2021    ECHOCARDIOGRAM REPORT   Patient Name:   Travis [name] Date of Exam: 01/07/2021 Medical Rec #:  [id number]     Height:       72.0 in Accession #:    [id number]    Weight:       187.6 lb Date of Birth:  07-Mar-1956    BSA:          2.073 m Patient Age:    65 years      BP:           104/55 mmHg Patient Gender: M             HR:           87 bpm. Exam Location:  Inpatient Procedure: 2D Echo, Cardiac Doppler and Color Doppler Indications:    Shock  History:        Patient has prior history of Echocardiogram examinations, most                 recent 11/09/2019. ETOH Abuse.  Sonographer:    Merrie Roof RDCS Referring Phys: [id number] [place]  1. No change in EF compared to TTE done 11/09/19. Left ventricular ejection fraction, by estimation, is 45 to 50%. The left ventricle has mildly decreased function. The left ventricle demonstrates global hypokinesis. Left ventricular diastolic parameters  were normal.  2. Right ventricular systolic function is normal. The right ventricular size is normal.  3. The mitral valve is normal in structure. Mild mitral valve regurgitation. No evidence of mitral stenosis.  4. The aortic valve was not well visualized. Aortic valve regurgitation is not visualized. Mild aortic valve sclerosis is present, with no evidence of aortic valve stenosis.  5. The inferior vena cava is normal in size with greater than 50% respiratory variability, suggesting right atrial pressure of 3 mmHg. FINDINGS  Left Ventricle: No change in EF compared to TTE done 11/09/19. Left ventricular ejection fraction, by estimation, is 45 to 50%. The left ventricle has mildly decreased function. The left ventricle demonstrates global hypokinesis. The left ventricular internal cavity  size was normal in size. There is no left ventricular hypertrophy. Left ventricular diastolic parameters were normal. Right Ventricle: The right ventricular size is normal. No increase in right ventricular wall thickness. Right ventricular systolic function is normal. Left Atrium: Left atrial size was normal in size. Right Atrium: Right atrial size was normal in size. Pericardium: There is no evidence of pericardial effusion. Mitral Valve: The mitral valve is normal in structure. Mild mitral valve regurgitation. No evidence of mitral valve stenosis. Tricuspid Valve: The  tricuspid valve is normal in structure. Tricuspid valve regurgitation is not demonstrated. No evidence of tricuspid stenosis. Aortic Valve: The aortic valve was not well visualized. Aortic valve regurgitation is not visualized. Mild aortic valve sclerosis is present, with no evidence of aortic valve stenosis. Aortic valve mean gradient measures 2.0 mmHg. Aortic valve peak gradient measures 4.6 mmHg. Aortic valve area, by VTI measures 2.61 cm. Pulmonic Valve: The pulmonic valve was normal in structure. Pulmonic valve regurgitation is not visualized. No evidence of pulmonic stenosis. Aorta: The aortic root is normal in size and structure. Venous: The inferior vena cava is normal in size with greater than 50% respiratory variability, suggesting right atrial pressure of 3 mmHg. IAS/Shunts: No atrial level shunt detected by color flow Doppler.  LEFT VENTRICLE PLAX 2D LVIDd:         4.60 cm      Diastology LVIDs:         3.50 cm      LV e' medial:  5.22 cm/s LV PW:         1.00 cm      LV e' lateral: 9.14 cm/s LV IVS:        1.00 cm LVOT diam:     2.30 cm LV SV:         53 LV SV Index:   25 LVOT Area:     4.15 cm  LV Volumes (MOD) LV vol d, MOD A2C: 142.0 ml LV vol d, MOD A4C: 186.0 ml LV vol s, MOD A2C: 69.7 ml LV vol s, MOD A4C: 91.1 ml LV SV MOD A2C:     72.3 ml LV SV MOD A4C:     186.0 ml LV SV MOD BP:      88.1 ml RIGHT VENTRICLE RV Basal diam:   3.60 cm LEFT ATRIUM           Index        RIGHT ATRIUM           Index LA diam:      3.00 cm 1.45 cm/m   RA Area:     15.30 cm LA Vol (A4C): 46.5 ml 22.43 ml/m  RA Volume:   33.70 ml  16.25 ml/m  AORTIC VALVE AV Area (Vmax):    2.88 cm AV Area (Vmean):   2.63 cm AV Area (VTI):     2.61 cm AV Vmax:           107.00 cm/s AV Vmean:          72.400 cm/s AV VTI:            0.202 m AV Peak Grad:      4.6 mmHg AV Mean Grad:      2.0 mmHg LVOT Vmax:         74.10 cm/s LVOT Vmean:        45.800 cm/s LVOT VTI:          0.127 m LVOT/AV VTI ratio: 0.63  AORTA Ao Root diam: 3.20 cm  SHUNTS Systemic VTI:  0.13 m Systemic Diam: 2.30 cm Jenkins Rouge MD Electronically signed by Jenkins Rouge MD Signature Date/Time: 01/07/2021/12:08:21 PM    Final     Anti-infectives: Anti-infectives (From admission, onward)    Start     Dose/Rate Route Frequency Ordered Stop   01/08/21 1000  vancomycin (VANCOREADY) IVPB 750 mg/150 mL        750 mg 150 mL/hr over 60 Minutes Intravenous Every 24 hours 01/07/21 2052  01/08/21 0600  vancomycin (VANCOREADY) IVPB 1500 mg/300 mL  Status:  Discontinued        1,500 mg 150 mL/hr over 120 Minutes Intravenous Every 24 hours 01/07/21 0430 01/07/21 1210   01/08/21 0600  vancomycin (VANCOREADY) IVPB 750 mg/150 mL  Status:  Discontinued        750 mg 150 mL/hr over 60 Minutes Intravenous Every 24 hours 01/07/21 1210 01/07/21 2052   01/07/21 1400  piperacillin-tazobactam (ZOSYN) IVPB 3.375 g        3.375 g 12.5 mL/hr over 240 Minutes Intravenous Every 8 hours 01/07/21 0430     01/07/21 0530  piperacillin-tazobactam (ZOSYN) IVPB 3.375 g        3.375 g 100 mL/hr over 30 Minutes Intravenous STAT 01/07/21 0430 01/07/21 0622   01/07/21 0530  vancomycin (VANCOREADY) IVPB 1750 mg/350 mL        1,750 mg 175 mL/hr over 120 Minutes Intravenous  Once 01/07/21 0430 01/07/21 0841   01/05/21 1430  Ampicillin-Sulbactam (UNASYN) 3 g in sodium chloride 0.9 % 100 mL IVPB        3 g 200 mL/hr over  30 Minutes Intravenous Every 6 hours 01/05/21 1334 01/06/21 1552   01/01/21 2200  piperacillin-tazobactam (ZOSYN) IVPB 3.375 g        3.375 g 12.5 mL/hr over 240 Minutes Intravenous Every 8 hours 01/01/21 1841 01/04/21 2359   01/01/21 2100  piperacillin-tazobactam (ZOSYN) IVPB 3.375 g  Status:  Discontinued        3.375 g 100 mL/hr over 30 Minutes Intravenous Every 8 hours 01/01/21 1836 01/01/21 1841   01/01/21 1345  piperacillin-tazobactam (ZOSYN) IVPB 3.375 g        3.375 g 100 mL/hr over 30 Minutes Intravenous  Once 01/01/21 1337 01/01/21 1430        Assessment/Plan Perforated appendicitis with abscess POD7 s/p exploratory laparotomy, ileocecectomy, drainage of intraabdominal abscess 01/01/21 Dr. Grandville Silos - NGT with bilious drainage, post-op ileus, await return of bowel function - Cxs w/ pansensitive E.Coli, continue IV Zosyn  - Drain SS this AM, continue drain - VAC change M/W/F to midline - Mobilize with PT/OT  Splenic laceration  POD2 s/p ex lap, splenectomy, application of incisional wound vac 10/14. Dr. Bobbye Morton  - follow H&H, hgb 12.2 >> 10.6 >> 8.3, repeat CBC in AM, transfuse PRN for hgb < 7.0 - post-op ileus as above  FEN: NPO, IVF, NGT LIWS, no meds per tube for now; TPN ordered to start today VTE: SQH started 10/15 ID: Zosyn 10/9>> Dispo: ICU, supportive care, DNR   Below per primary team: hemorrhagic shock 2/2 above s/p 6 u PRBC, 2 u FFP 10/14 Septic shock 2/2 perforated appendicitis  EtOH abuse Alcohol related cardiomyopathy T2DM Chronic anemia Hx of TIA HTN HLD Gout Hx of GIB CAD Tobacco abuse   LOS: 7 days    Jill Alexanders, [facility] 01/08/2021, 8:49 AM Please see Amion for pager number during day hours 7:00am-4:30pm

## 2021-01-08 NOTE — Progress Notes (Signed)
eLink Physician-Brief Progress Note Patient Name: Travis [name] DOB: [birth year] MRN: [id number]   Date of Service  01/08/2021  HPI/Events of Note  had an order for IV tylenol X4 doses for feverpain. Only received 1 dose. can you renew the order please??  had been getting IV rectal, but now that self-extubated adamantly does not want it.   Not using feeding tube yet, just started trickle feeds today   eICU Interventions  Ordered IV tylenol prn.      Intervention Category Intermediate Interventions: Pain - evaluation and management;Other:  Elmer Sow 01/08/2021, 8:27 PM

## 2021-01-08 NOTE — Progress Notes (Signed)
Pharmacy Antibiotic Note  Travis [name] is a 65 y.o. male admitted on 01/01/2021 with abdominal pain found to have ruptured appendix. S/p appendectomy 10/9 and splenectomy 10/14. Found to be septic and started on Vancomycin and Zosyn on 10/15. Pharmacy now consulted to start cefepime.  WBC 17.4, febrile overnight to 102 Vancomycin Goal AUC 400-550.  Plan: Stop Zosyn  Start cefepime 2g q24h Flagyl '500mg'$  q12h added for anaerobic coverage F/u need for vancomycin and obtain VL as needed Will f/u renal function, micro data, and pt's clinical condition  Height: 6' (182.9 cm) Weight: 91.2 kg (201 lb 1 oz) IBW/kg (Calculated) : 77.6  Temp (24hrs), Avg:101 F (38.3 C), Min:98.4 F (36.9 C), Max:102.9 F (39.4 C)  Recent Labs  Lab 01/01/21 1927 01/02/21 0321 01/04/21 0420 01/04/21 [id number] 01/05/21 0211 01/06/21 0124 01/06/21 0308 01/06/21 0815 01/06/21 1128 01/07/21 0639 01/08/21 0446  WBC  --    < >  --    < > 8.9 13.3*  --  10.9*  --  14.9* 17.4*  CREATININE  --    < >  --   --  1.43* 1.83* 1.77* 1.54*  --  3.01* 2.74*  LATICACIDVEN 1.6  --  0.8  --   --  2.7* 2.1*  --  1.9  --   --    < > = values in this interval not displayed.     Estimated Creatinine Clearance: 29.9 mL/min (A) (by C-G formula based on SCr of 2.74 mg/dL (H)).    No Known Allergies  Antimicrobials this admission: 10/15 Vanc >>  10/16 cefepime >> 10/16 flagyl >> 10/9>> Zosyn >>10/12; restarted 10/14>>10/16 10/13>10/14 Unasyn  Microbiology results: 10/9 abd fluid: Ecoli, enterococcus durans (pan sens) 10/10 UCx: neg 10/9 BCx: 1/4 bacteroides species not fragilis, B-lactamase +  Thank you for allowing pharmacy to participate in this patient's care.  Levonne Spiller, PharmD PGY1 Acute Care Resident  01/08/2021,11:22 AM

## 2021-01-08 NOTE — Progress Notes (Signed)
[place] Progress Note Patient Name: Travis [name] DOB: 1955/06/18 MRN: [id number]   Date of Service  01/08/2021  HPI/Events of Note  Waiting for family to go for comfort care. Multiple abd surgery. Extubated today, was on 150 fenta gtt. Asking for fentanyl for pain.  - DC fentanyl gtt. - Fentanyl 50 mcg q2 hr intravenously prn for severe pain ordered. DNR.   eICU Interventions       Intervention Category Intermediate Interventions: Pain - evaluation and management  Elmer Sow 01/08/2021, 7:26 PM

## 2021-01-08 NOTE — Progress Notes (Signed)
PHARMACY - TOTAL PARENTERAL NUTRITION CONSULT NOTE   Indication: Prolonged ileus  Patient Measurements: Height: 6' (182.9 cm) Weight: 91.2 kg (201 lb 1 oz) IBW/kg (Calculated) : 77.6   Body mass index is 27.27 kg/m.  Assessment: 37 yom presenting 10/9 with acute perforated appendix, and abdominal abscess s/p OR for ex-lap, ilececectomy, and drainage of abscess on 10/9. Also had splenic laceration, s/p ex-lap, splenectomy, and wound vac application [id number]. Transferred to ICU on 10/12 with septic vs. hemorrhagic shock, intubated and on vasopressor support. Plan was initially to transition to comfort measures 10/14; however improved overnight with decreasing vasopressor requirement. Post-op ileus. Pharmacy consulted to start TPN.  Noted - BM documented since TPN consult placed. Per discussion with Surgery, will be at least 48 hrs until tube feeds started and likely longer until at goal. TPN will still be started today with patient already at least 8 days without nutrition.  Glucose / Insulin: hx DM2 (no meds documented PTA). CBGs controlled 130-150s prior to TPN start. Utilized 14 units moderate SSI in last 24hrs Electrolytes: K 3.7 (goal >/=4 with ileus), CO2 low stable 19, corrected Ca >10, Phos 5.3 (last 10/15), Mag 2 (last 10/15) Renal: AKI on CKD - SCr back down to 2.74 today (baseline ~1.2-1.8) Hepatic: LFTs / TG WNL. Tbili down to 2.5, albumin <1.5 Intake / Output; MIVF: UOP 0.3 ml/kg/hr, NGT output 350 ml, drain output 40 ml; net +8.6L this admit; LBM 10/16 GI Imaging: none since TPN start GI Surgeries / Procedures: none since TPN start  Central access: CVC triple lumen 10/14 TPN start date: 01/08/21  Nutritional Goals: Goal TPN rate is 90 mL/hr (provides 123 g of protein and 2205 kcals per day)  RD Assessment: Estimated Needs Total Energy Estimated Needs: 2200-2400 Total Protein Estimated Needs: 120-140 gm Total Fluid Estimated Needs: 2.2-2.4 L  Current Nutrition:   NPO  Plan:  Start TPN at 37m/hr at 1800. Titrate to goal as appropriate. Monitor closely for refeeding. TPN will provide ~45% of patient protein and kCal needs. Electrolytes in TPN: standard except reduce Ca/Phos slightly - Na 520m/L, K 5070mL, Ca 3mE45m, Mg 5mEq35m and Phos 12mmo67m Cl:Ac 1:2 Add standard MVI and trace elements to TPN + folic acid and thiamine with hx ETOH abuse Continue Moderate q4h SSI and adjust as needed. F/u need to add insulin to TPN bag 10/17. Monitor TPN labs on Mon/Thurs F/u return of bowel function, ability to initiate tube feeds and wean off TPN   Ashlen Kiger Arturo MortonmD, BCPS Please check AMION for all [facility] numbers Clinical Pharmacist 01/08/2021 9:38 AM

## 2021-01-08 NOTE — Progress Notes (Signed)
NAME:  Travis [name], MRN:  [id number], DOB:  Dec 15, 1955, LOS: 7 ADMISSION DATE:  01/01/2021, CONSULTATION DATE:  01/01/2021 REFERRING MD:  Dr Georganna Skeans, CHIEF COMPLAINT:  abdominal pain   History of Present Illness:  [name]. Travis [name] is a 65 year old gentleman with an extended PMH that includes but not limited to CHF, CKD 3A, COPD, CRA O, TIA, diabetes, hypertension, gout, hyperlipidemia, CAD (nonobstructive). She presented to the ED on 01/01/21 with complaints of right lower quadrant abdominal pain. He was found to be pyrexial and tachycardiac. Work up included a CT scan of the abdomen and pelvis which revealed:  "Circumferential mucosal thickening and indistinctness of the bowel wall at the base of the appendix and the distal cecum at the ileocecal junction. There is an associated milder mucosal thickening and indistinctness of the terminal ileum. The distal appendix is decompressed, contains gas and fecal matter and overall has normal appearance. Small amount of free fluid and free gas in the right lower quadrant of the abdomen. These findings are likely due to acute appendicitis with perforation. It is unclear however whether the mucosal thickening of the cecum is reactive or is the source of appendiceal obstruction. Underlying malignancy such as mucinous tumor is in the differential diagnosis. 2. Small foci of free intraperitoneal gas and small amount of fluid scattered in the mesentery of the right lower and upper quadrants of the abdomen, and in perihepatic location."  Patient was therefore taken to the operating room where he was found to have a perforated appendix and abdominal abscess. He is S/P DIAGNOSTIC LAPAROSCOPY; EXPLORATORY LAPAROTOMY, ILECECECTOMY, DRAINAGE OF INTRA-ABDOMINAL ABSCESS  The patient is now in the ICU and on mechanical ventilation.  He arrived on Neosynephrine drip for vasopressor support and on IVF.  I have seen and examined the patient.  Pertinent   Medical History  CKD 3A COPD TIA DM  Significant Hospital Events: Including procedures, antibiotic start and stop dates in addition to other pertinent events   10/9: ex lap, remained intubated 10/10: extubated 10/12 transferred to [facility] 10/14 0130 hypotensive with worsening abd pain, surgery called and asked for repeat CT scan.  Started on Levophed.  Back to PCCM service. 6U pRBC total, 4U FFP total. To OR for splenectomy.  Interim History / Subjective:  Stable overnight, no acute issues. Following commands.   Objective   Blood pressure 105/65, pulse 74, temperature 98.4 F (36.9 C), resp. rate 20, height 6' (1.829 m), weight 91.2 kg, SpO2 95 %. CVP:  [7 mmHg-11 mmHg] 7 mmHg  Vent Mode: PRVC FiO2 (%):  [50 %-60 %] 60 % Set Rate:  [20 bmp] 20 bmp Vt Set:  [620 mL] 620 mL PEEP:  [5 cmH20] 5 cmH20 Plateau Pressure:  [14 cmH20-17 cmH20] 17 cmH20   Intake/Output Summary (Last 24 hours) at 01/08/2021 1055 Last data filed at 01/08/2021 0800 Gross per 24 hour  Intake 1185.84 ml  Output 845 ml  Net 340.84 ml   Filed Weights   01/05/21 0328 01/06/21 0438 01/08/21 0356  Weight: 85.9 kg 85.1 kg 91.2 kg    Examination: General appearance: 65 y.o., male male, chronically ill appearing Eyes: pupils equal, tracks HENT: NCAT;  dry MM Lungs:  mech breath sounds, equal chest rise CV: RRR no murmur Abdomen: Soft, non-tender; non-distended, hypoactive BS, mid line incision with wound vac dressing in place Extremities: No peripheral edema, lukewarm Neuro: opens eyes to voice, follows commands  CBC stable  TTE with EF 45-50%, global hypokinesis  Assessment & Plan:   Shock: Presented with septic shock in setting ruptured appendix course c/b hemorrhagic shock from spontaneous splenic rupture 10/14 with deterioration post splenectomy either from vasoplegia related to hemorrhagic shock or possible intraabdominal infection. Improving. - vanc/zosyn 10/14-, given anticipated empiric 7 day course  of vanc, stop zosyn and switch to cefepime/flagyl - wean levo for map 65 - stop vaso - trend CBC  Acute respiratory failure Acute encephalopathy Intubated in setting of TME, shock. 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 - SSI  Hx of nonischemic cardiomyopathy, non obstructive CAD, HTN. - Continue to hold home meds    Best Practice (right click and "Reselect all SmartList Selections" daily)   Diet/type: NPO and TPN DVT prophylaxis: prophylactic heparin  GI prophylaxis: H2B Lines: yes and it is still needed Foley:  Yes, and it is still needed Code Status:  DNR Last date of multidisciplinary goals of care discussion: with niece [name] 10/14, 10/15. In light of sharp deterioration on 10/14 we discussed plan for transition to comfort measures only while continuing vasopressors, crystalloids, ABX to allow family a chance to arrive to bedside prior to transition. Vasopressor requirement and mental status improved overnight, possibly with broadening ABX/crystalloid administration. Given good functional status by report from family prior to admission and improvement overnight, we'll continue all supportive measures but he remains DNR.   CC time: 35 min.   Travis [name] Pulmonary/Critical Care  01/08/2021, 10:55 AM

## 2021-01-08 NOTE — Procedures (Signed)
Extubation Procedure Note  Patient Details:   Name: Travis [name] DOB: 1955/05/07 MRN: [id number]   Airway Documentation:    Vent end date: 01/08/21 Vent end time: 1612   Evaluation  O2 sats: stable throughout Complications: No apparent complications Patient did tolerate procedure well. Bilateral Breath Sounds: Clear, Diminished   Yes, pt could speak post extubation.  Pt self-extubated.  RN placed patient on 4 l/m [place].  Earney Navy 01/08/2021, 4:13 PM

## 2021-01-09 ENCOUNTER — Encounter (HOSPITAL_COMMUNITY): Payer: Self-pay | Admitting: Surgery

## 2021-01-09 DIAGNOSIS — Z515 Encounter for palliative care: Secondary | ICD-10-CM | POA: Diagnosis not present

## 2021-01-09 LAB — CBC WITH DIFFERENTIAL/PLATELET
Abs Immature Granulocytes: 0 10*3/uL (ref 0.00–0.07)
Basophils Absolute: 0 10*3/uL (ref 0.0–0.1)
Basophils Relative: 0 %
Eosinophils Absolute: 0.2 10*3/uL (ref 0.0–0.5)
Eosinophils Relative: 1 %
HCT: 25.2 % — ABNORMAL LOW (ref 39.0–52.0)
Hemoglobin: 8.4 g/dL — ABNORMAL LOW (ref 13.0–17.0)
Lymphocytes Relative: 4 %
Lymphs Abs: 0.9 10*3/uL (ref 0.7–4.0)
MCH: 30.3 pg (ref 26.0–34.0)
MCHC: 33.3 g/dL (ref 30.0–36.0)
MCV: 91 fL (ref 80.0–100.0)
Monocytes Absolute: 0.9 10*3/uL (ref 0.1–1.0)
Monocytes Relative: 4 %
Neutro Abs: 20.7 10*3/uL — ABNORMAL HIGH (ref 1.7–7.7)
Neutrophils Relative %: 91 %
Platelets: 297 10*3/uL (ref 150–400)
RBC: 2.77 MIL/uL — ABNORMAL LOW (ref 4.22–5.81)
RDW: 16.4 % — ABNORMAL HIGH (ref 11.5–15.5)
WBC: 22.7 10*3/uL — ABNORMAL HIGH (ref 4.0–10.5)
nRBC: 0 /100 WBC
nRBC: 0.2 % (ref 0.0–0.2)

## 2021-01-09 LAB — PHOSPHORUS: Phosphorus: 3.1 mg/dL (ref 2.5–4.6)

## 2021-01-09 LAB — GLUCOSE, CAPILLARY
Glucose-Capillary: 146 mg/dL — ABNORMAL HIGH (ref 70–99)
Glucose-Capillary: 149 mg/dL — ABNORMAL HIGH (ref 70–99)
Glucose-Capillary: 161 mg/dL — ABNORMAL HIGH (ref 70–99)
Glucose-Capillary: 153 mg/dL — ABNORMAL HIGH (ref 70–99)
Glucose-Capillary: 135 mg/dL — ABNORMAL HIGH (ref 70–99)
Glucose-Capillary: 214 mg/dL — ABNORMAL HIGH (ref 70–99)

## 2021-01-09 LAB — COMPREHENSIVE METABOLIC PANEL
ALT: 17 U/L (ref 0–44)
AST: 27 U/L (ref 15–41)
Albumin: <1.5 g/dL — ABNORMAL LOW (ref 3.5–5.0)
Alkaline Phosphatase: 98 U/L (ref 38–126)
Anion gap: 9 (ref 5–15)
BUN: 40 mg/dL — ABNORMAL HIGH (ref 8–23)
CO2: 22 mmol/L (ref 22–32)
Calcium: 8.3 mg/dL — ABNORMAL LOW (ref 8.9–10.3)
Chloride: 111 mmol/L (ref 98–111)
Creatinine, Ser: 1.84 mg/dL — ABNORMAL HIGH (ref 0.61–1.24)
GFR, Estimated: 40 mL/min — ABNORMAL LOW (ref >60)
Glucose, Bld: 152 mg/dL — ABNORMAL HIGH (ref 70–99)
Potassium: 3.6 mmol/L (ref 3.5–5.1)
Sodium: 142 mmol/L (ref 135–145)
Total Bilirubin: 2 mg/dL — ABNORMAL HIGH (ref 0.3–1.2)
Total Protein: 5.2 g/dL — ABNORMAL LOW (ref 6.5–8.1)

## 2021-01-09 LAB — MAGNESIUM: Magnesium: 2.9 mg/dL — ABNORMAL HIGH (ref 1.7–2.4)

## 2021-01-09 LAB — VANCOMYCIN, TROUGH: Vancomycin Tr: 10 ug/mL — ABNORMAL LOW (ref 15–20)

## 2021-01-09 LAB — TRIGLYCERIDES: Triglycerides: 271 mg/dL — ABNORMAL HIGH (ref <150)

## 2021-01-09 MED ORDER — HYDROMORPHONE HCL 1 MG/ML IJ SOLN
0.2000 mg | Freq: Once | INTRAMUSCULAR | Status: AC
  Administered 2021-01-09: 0.2 mg via INTRAVENOUS
  Filled 2021-01-09: qty 0.5

## 2021-01-09 MED ORDER — POTASSIUM CHLORIDE 10 MEQ/50ML IV SOLN
10.0000 meq | INTRAVENOUS | Status: AC
  Administered 2021-01-09 (×3): 10 meq via INTRAVENOUS
  Filled 2021-01-09 (×3): qty 50

## 2021-01-09 MED ORDER — ACETAMINOPHEN 325 MG PO TABS
650.0000 mg | ORAL_TABLET | Freq: Four times a day (QID) | ORAL | Status: DC | PRN
  Administered 2021-01-09 – 2021-01-10 (×3): 650 mg
  Filled 2021-01-09 (×3): qty 2

## 2021-01-09 MED ORDER — CARVEDILOL 12.5 MG PO TABS
6.2500 mg | ORAL_TABLET | Freq: Two times a day (BID) | ORAL | Status: DC
  Administered 2021-01-09 – 2021-01-10 (×3): 6.25 mg
  Filled 2021-01-09 (×3): qty 1

## 2021-01-09 MED ORDER — INSULIN ASPART 100 UNIT/ML IJ SOLN
0.0000 [IU] | INTRAMUSCULAR | Status: DC
  Administered 2021-01-09: 7 [IU] via SUBCUTANEOUS
  Administered 2021-01-09: 3 [IU] via SUBCUTANEOUS
  Administered 2021-01-09: 4 [IU] via SUBCUTANEOUS
  Administered 2021-01-10 (×2): 7 [IU] via SUBCUTANEOUS
  Administered 2021-01-10 (×2): 3 [IU] via SUBCUTANEOUS
  Administered 2021-01-10 (×2): 4 [IU] via SUBCUTANEOUS
  Administered 2021-01-11: 7 [IU] via SUBCUTANEOUS
  Administered 2021-01-11 (×3): 4 [IU] via SUBCUTANEOUS
  Administered 2021-01-11 – 2021-01-12 (×2): 7 [IU] via SUBCUTANEOUS
  Administered 2021-01-12: 4 [IU] via SUBCUTANEOUS
  Administered 2021-01-12: 11 [IU] via SUBCUTANEOUS
  Administered 2021-01-12: 7 [IU] via SUBCUTANEOUS
  Administered 2021-01-12: 4 [IU] via SUBCUTANEOUS
  Administered 2021-01-12: 7 [IU] via SUBCUTANEOUS
  Administered 2021-01-13: 11 [IU] via SUBCUTANEOUS
  Administered 2021-01-13: 4 [IU] via SUBCUTANEOUS
  Administered 2021-01-13: 11 [IU] via SUBCUTANEOUS
  Administered 2021-01-13: 7 [IU] via SUBCUTANEOUS
  Administered 2021-01-13: 11 [IU] via SUBCUTANEOUS
  Administered 2021-01-13: 7 [IU] via SUBCUTANEOUS
  Administered 2021-01-14: 4 [IU] via SUBCUTANEOUS
  Administered 2021-01-14: 7 [IU] via SUBCUTANEOUS
  Administered 2021-01-14: 11 [IU] via SUBCUTANEOUS
  Administered 2021-01-14 (×2): 7 [IU] via SUBCUTANEOUS
  Administered 2021-01-14: 11 [IU] via SUBCUTANEOUS
  Administered 2021-01-14: 7 [IU] via SUBCUTANEOUS
  Administered 2021-01-15: 11 [IU] via SUBCUTANEOUS
  Administered 2021-01-15 (×2): 7 [IU] via SUBCUTANEOUS
  Administered 2021-01-15: 4 [IU] via SUBCUTANEOUS
  Administered 2021-01-15: 7 [IU] via SUBCUTANEOUS
  Administered 2021-01-15 – 2021-01-16 (×4): 4 [IU] via SUBCUTANEOUS
  Administered 2021-01-16: 3 [IU] via SUBCUTANEOUS
  Administered 2021-01-16 – 2021-01-17 (×4): 4 [IU] via SUBCUTANEOUS
  Administered 2021-01-17: 7 [IU] via SUBCUTANEOUS
  Administered 2021-01-17 (×3): 4 [IU] via SUBCUTANEOUS
  Administered 2021-01-18 (×3): 3 [IU] via SUBCUTANEOUS
  Administered 2021-01-18 – 2021-01-19 (×3): 4 [IU] via SUBCUTANEOUS
  Administered 2021-01-19: 3 [IU] via SUBCUTANEOUS
  Administered 2021-01-19: 4 [IU] via SUBCUTANEOUS
  Administered 2021-01-19: 3 [IU] via SUBCUTANEOUS
  Administered 2021-01-19 (×2): 4 [IU] via SUBCUTANEOUS
  Administered 2021-01-20 (×2): 3 [IU] via SUBCUTANEOUS

## 2021-01-09 MED ORDER — SODIUM CHLORIDE 0.9 % IV SOLN
2.0000 g | Freq: Two times a day (BID) | INTRAVENOUS | Status: DC
  Administered 2021-01-09: 2 g via INTRAVENOUS
  Filled 2021-01-09: qty 2

## 2021-01-09 MED ORDER — PHENOL 1.4 % MT LIQD
1.0000 | OROMUCOSAL | Status: DC | PRN
  Administered 2021-01-09 – 2021-02-05 (×3): 1 via OROMUCOSAL
  Filled 2021-01-09 (×2): qty 177

## 2021-01-09 MED ORDER — FENTANYL CITRATE (PF) 100 MCG/2ML IJ SOLN
50.0000 ug | INTRAMUSCULAR | Status: DC | PRN
  Administered 2021-01-09 (×2): 50 ug via INTRAVENOUS
  Filled 2021-01-09 (×2): qty 2

## 2021-01-09 MED ORDER — TRAVASOL 10 % IV SOLN
INTRAVENOUS | Status: AC
  Filled 2021-01-09: qty 889.2

## 2021-01-09 MED ORDER — PIPERACILLIN-TAZOBACTAM 3.375 G IVPB
3.3750 g | Freq: Three times a day (TID) | INTRAVENOUS | Status: DC
  Administered 2021-01-09 – 2021-01-12 (×9): 3.375 g via INTRAVENOUS
  Filled 2021-01-09 (×10): qty 50

## 2021-01-09 NOTE — Consult Note (Signed)
[place] Nurse Consult Note: Patient receiving care in [facility] 2M09 Reason for Consult: vac not working Wound type: Midline abdominal surgical incision Pressure Injury POA: NA Wound bed: pink granulation tissue. Friable. Bleeding when dressing removed.  Drainage (amount, consistency, odor) Bloody Periwound: Intact Dressing procedure/placement/frequency: W/D dressing removed. 2 pieces of black foam used to fill the wound bed which extends down to the pubic hair line. Medium size Kellie Simmering # [phone]) Drape applied and immediate suction obtained at 125 mmHg. Canister Kellie Simmering # [phone]). Two people appreciated during this dressing change. M/W/F vac change. WOC will follow.  Monitor the wound area(s) for worsening of condition such as: Signs/symptoms of infection, increase in size, development of or worsening of odor, development of pain, or increased pain at the affected locations.   Notify the medical team if any of these develop.  Thank you for the consult. [place] nurse will not follow at this time.   Please re-consult the [place] team if needed.  Cathlean Marseilles [name], MSN, RN, [place], Lysle Pearl, [facility] Wound Treatment Associate Pager [phone]

## 2021-01-09 NOTE — Progress Notes (Addendum)
Initial Nutrition Assessment  DOCUMENTATION CODES:   Non-severe (moderate) malnutrition in context of chronic illness  INTERVENTION:   -TPN management per pharmacy -RD to follow-up regarding ability to transition to enteral feeds  NUTRITION DIAGNOSIS:   Moderate Malnutrition related to acute illness, chronic illness (perforated appendix, CAD, cardiomyopathy) as evidenced by mild muscle depletion, mild fat depletion.  Ongoing  GOAL:   Patient will meet greater than or equal to 90% of their needs  Progressing; TPN initiated 01/08/21  MONITOR:   Diet advancement, Labs, Weight trends, Skin, I & O's  REASON FOR ASSESSMENT:   Rounds    ASSESSMENT:   65 yo male admitted with perforated appendix and abdominal abscess. S/P ex lap, ileocecectomy, drainage of intra-abdominal abscess. PMH includes CHF, CKD 3A, COPD, TIA, DM, HTN, gout, HLD, CAD, alcohol abuse, tobacco abuse.  10/9- s/p ex lap, ileocectomy, drainage of intra-abdominal abscess 10/11- s/p ex lap, splenectomy, and incisional wound vac placement 10/16- extubated, TPN initiated  Reviewed I/O's: -99 ml x 24 hours and +8.5 L since admission  UOP: 1.5 L x 24 hours  NGT output: 40 ml x 24 hours  Drain output: 10 ml x 24 hours  Per general surgery notes, NGT with bilious drainage.   Pt currently receiving TPN at 40 ml/hr, which provides 980 kcals and 55 grams protein, which meets 41% of estimated needs and 44% of estimated protein needs. Per pharmacy note, plan to increase TPN to 65 ml/hr at 1800, which provides 1593 kcals and 89 grams protein, which meets 66% of estimated needs and 71% of estimated protein needs.   Palliative care following; family remains hopeful for improvement at this time.  Labs reviewed: CBGS: 149-161 (inpatient orders for glycemic control are 0-20 units insulin aspart every 4 hours).    Diet Order:   Diet Order     None       EDUCATION NEEDS:   No education needs have been identified  at this time  Skin:  Skin Assessment: Skin Integrity Issues: Skin Integrity Issues:: Wound VAC Wound Vac: abdomen  Last BM:  01/08/21  Height:   Ht Readings from Last 1 Encounters:  01/01/21 6' (1.829 m)    Weight:   Wt Readings from Last 1 Encounters:  01/08/21 91.2 kg    BMI:  Body mass index is 27.27 kg/m.  Estimated Nutritional Needs:   Kcal:  2400-2600  Protein:  125-150 grams  Fluid:  > 2 L    Loistine Chance, RD, LDN, [place] Registered Dietitian II Certified Diabetes Care and Education Specialist Please refer to [facility] for RD and/or RD on-call/weekend/after hours pager

## 2021-01-09 NOTE — Progress Notes (Addendum)
PHARMACY - TOTAL PARENTERAL NUTRITION CONSULT NOTE   Indication: Prolonged ileus  Patient Measurements: Height: 6' (182.9 cm) Weight: 91.2 kg (201 lb 1 oz) IBW/kg (Calculated) : 77.6   Body mass index is 27.27 kg/m.  Assessment: 9 yom presenting 10/9 with acute perforated appendix, and abdominal abscess s/p OR for ex-lap, ilececectomy, and drainage of abscess on 10/9. Also had splenic laceration, s/p ex-lap, splenectomy, and wound vac application [id number]. Transferred to ICU on 10/12 with septic vs. hemorrhagic shock, intubated and on vasopressor support. Plan was initially to transition to comfort measures 10/14; however improved overnight with decreasing vasopressor requirement. Post-op ileus. Pharmacy consulted to start TPN.  Noted - BM documented since TPN consult placed. Per discussion with Surgery, will be at least 48 hrs until tube feeds started and likely longer until at goal. TPN will still be started today with patient already at least 8 days without nutrition.  Glucose / Insulin: hx DM2 (no meds documented PTA). CBGs controlled 130-150s prior to TPN start. CBGs 140-150s since TPN start. Utilized 11 units moderate SSI in last 24hrs Electrolytes: K 3.6 (goal >/=4 with ileus), Cl 111, CoCa >10, Mg 2.9 Renal: AKI on CKD - SCr back down to 1.84. BUN 40.  Hepatic: LFTs wnl. TG 271 - increased from 138 on 10/10. Tbili down to 2.0 (no jaundice noted), albumin <1.5 Intake / Output; MIVF: UOP 0.7 ml/kg/hr, NGT output 0 ml. JP drain output 0 ml; LBM 10/16 GI Imaging: none since TPN start GI Surgeries / Procedures: none since TPN start  Central access: CVC triple lumen 10/14 TPN start date: 01/08/21  Nutritional Goals: Goal TPN rate is 90 mL/hr (provides 123 g of protein and 2205 kcals per day)  RD Assessment: Estimated Needs Total Energy Estimated Needs: 2200-2400 Total Protein Estimated Needs: 120-140 gm Total Fluid Estimated Needs: 2.2-2.4 L  Current Nutrition:  NPO  Plan:   Increase TPN to 65 ml/hr at 1800. Titrate to goal as appropriate. Monitor closely for refeeding. Electrolytes in TPN: remove Ca and Mg. Continue Na 50 mEq/L, K 50 mEqL, Phos 12 mmol/L. Continue Cl:Ac 1:2 - might need to adjust tomorrow. KCl 10 mEq IV x3 Add standard MVI and trace elements to TPN + folic acid and thiamine with hx ETOH abuse Increase SSI to resistant q4h and adjust as needed. F/u need to add insulin to TPN bag Monitor TPN labs on Mon/Thurs, repeat electrolytes tomorrow Repeat TG tomorrow morning  F/u return of bowel function, ability to initiate tube feeds and wean off TPN  Cristela Felt, PharmD, BCPS Clinical Pharmacist 01/09/2021 7:31 AM

## 2021-01-09 NOTE — Progress Notes (Signed)
   Palliative Medicine Inpatient Follow Up Note  Consulting Provider: Delilah Shan, RN   Reason for consult:   [place] Palliative Medicine Consult  Reason for Consult? Pt is needing increasing pressor/vent support, 2 major abdominal surgeries. RN and MD have spoken with family multiple times about comfort care but family is still having some trouble processing everything. Thank you!    HPI:  Per intake H&P --> Mr. [name] Dimond. Shaff is a 66 year old gentleman with an extended PMH that includes but not limited to CHF, CKD 3A, COPD, CRA O, TIA, diabetes, hypertension, gout, hyperlipidemia, CAD (nonobstructive). He presented to the ED on 01/01/21 with complaints of right lower quadrant abdominal pain --> taken to the operating room where he was found to have a perforated appendix and abdominal abscess. 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 Now with rapidly worsening shock. Palliative care was asked to get involved to further address goals of care in the setting of overall critical illness and ongoing declining state.   Today's Discussion (01/09/2021):  *Please note that this is a verbal dictation therefore any spelling or grammatical errors are due to the "[place] [employer]" system interpretation.  Chart reviewed.  I met with Cheyenne at bedside. He has been extubated and it alert and oriented this morning. He shares with me the continued hopes for improvements. We discussed the complex nature of Kayven's hospital stay which he seemed to have fair insight in regards to.   At this time, landrum carbonell wanting to continue with present measures to optimize his situation.   There is no family present at bedside.   I shared that the palliative medicine team will continue to follow along peripherally unless any needs arise sooner.   Objective Assessment: Vital Signs Vitals:   01/09/21 0800 01/09/21 0830  BP: (!) 144/81   Pulse:  (!) 116 (!) 116  Resp: (!) 29 (!) 26  Temp: 100.2 F (37.9 C) 100.2 F (37.9 C)  SpO2: 93%     Intake/Output Summary (Last 24 hours) at 01/09/2021 8299 Last data filed at 01/09/2021 0600 Gross per 24 hour  Intake 1301.45 ml  Output 1350 ml  Net -48.55 ml   Last Weight  Most recent update: 01/08/2021  3:57 AM    Weight  91.2 kg (201 lb 1 oz)            Gen: Very ill appearing AA M HEENT: Core track CV: Irregular rate and rhythm  PULM: On 8LPM [place] ABD: soft/nontender  EXT: No edema  Neuro: Alert and oriented, able to converse  SUMMARY OF RECOMMENDATIONS   DNAR   Continue current level of care - Patient goals are for continued optimization    Patient is a member of [employer], will request chaplain support   Palliative care will continue to peripherally follow as goals remain for improvement  Time Spent: 25 Greater than 50% of the time was spent in counseling and coordination of care ______________________________________________________________________________________ [place] Team Team Cell Phone: [phone] Please utilize secure chat with additional questions, if there is no response within 30 minutes please call the above phone number  Palliative Medicine Team providers are available by phone from 7am to 7pm daily and can be reached through the team cell phone.  Should this patient require assistance outside of these hours, please call the patient's attending physician.

## 2021-01-09 NOTE — Progress Notes (Addendum)
[place] Progress Note Patient Name: Travis [name] DOB: 01-29-1956 MRN: [id number]   Date of Service  01/09/2021  HPI/Events of Note  Fentanyl working, but still not cutting as per bed side RN discussion about ongoing pain from his bowel surgeries.  Able to protect airways, alert and oriented x 2. Cr > 2. Dilaudid might work better.  eICU Interventions  Low dose dilaudid ordered intravenously. Asp precautions. Watch for lethargy.      Intervention Category Intermediate Interventions: Pain - evaluation and management Minor Interventions: Communication with other healthcare providers and/or family  Elmer Sow 01/09/2021, 1:15 AM  4:34 AM Re ordered low dose dilaudid, did help her pain.

## 2021-01-09 NOTE — Progress Notes (Addendum)
Progress Note   Subjective: Extubated Off pressors Asking for sponge to wet his mouth  Objective: Vital signs in last 24 hours: Temp:  [98.1 F (36.7 C)-100.2 F (37.9 C)] 100.2 F (37.9 C) (10/17 0830) Pulse Rate:  [71-120] 116 (10/17 0830) Resp:  [18-29] 26 (10/17 0830) BP: (98-144)/(61-81) 144/81 (10/17 0800) SpO2:  [87 %-97 %] 93 % (10/17 0800) Arterial Line BP: (92-181)/(49-78) 172/71 (10/17 0730) FiO2 (%):  [36 %-60 %] 36 % (10/16 1614) Last BM Date: 01/08/21  Intake/Output from previous day: 10/16 0701 - 10/17 0700 In: 1350.9 [I.V.:759; IV Piggyback:591.9] Out: 1450 [Urine:1450] Intake/Output this shift: No intake/output data recorded.  PE:  Abd: soft, appropriately ttp, moderately distended, vac not holding suction - removed by PA and packed wet to dry, will replace vac, NG to LIWS  Lab Results:  Recent Labs    01/08/21 0446 01/09/21 0332  WBC 17.4* 22.7*  HGB 8.3* 8.4*  HCT 24.4* 25.2*  PLT 248 297    BMET Recent Labs    01/08/21 0446 01/09/21 0332  NA 138 142  K 3.7 3.6  CL 106 111  CO2 19* 22  GLUCOSE 158* 152*  BUN 48* 40*  CREATININE 2.74* 1.84*  CALCIUM 8.0* 8.3*    PT/INR No results for input(s): LABPROT, INR in the last 72 hours.  CMP     Component Value Date/Time   NA 142 01/09/2021 0332   NA 138 06/20/2020 1034   NA 141 04/20/2014 1019   K 3.6 01/09/2021 0332   K 3.7 04/20/2014 1019   CL 111 01/09/2021 0332   CL 106 04/20/2014 1019   CO2 22 01/09/2021 0332   CO2 30 04/20/2014 1019   GLUCOSE 152 (H) 01/09/2021 0332   GLUCOSE 102 (H) 04/20/2014 1019   BUN 40 (H) 01/09/2021 0332   BUN 18 06/20/2020 1034   BUN 14 04/20/2014 1019   CREATININE 1.84 (H) 01/09/2021 0332   CREATININE 1.18 04/20/2014 1019   CALCIUM 8.3 (L) 01/09/2021 0332   CALCIUM 9.3 04/20/2014 1019   PROT 5.2 (L) 01/09/2021 0332   PROT 6.5 06/18/2017 1425   PROT 7.4 04/20/2014 1019   ALBUMIN <1.5 (L) 01/09/2021 0332   ALBUMIN 4.0 06/18/2017 1425    ALBUMIN 3.4 04/20/2014 1019   AST 27 01/09/2021 0332   AST 21 04/20/2014 1019   ALT 17 01/09/2021 0332   ALT 20 04/20/2014 1019   ALKPHOS 98 01/09/2021 0332   ALKPHOS 97 04/20/2014 1019   BILITOT 2.0 (H) 01/09/2021 0332   BILITOT 0.4 06/18/2017 1425   BILITOT 0.6 04/20/2014 1019   GFRNONAA 40 (L) 01/09/2021 0332   GFRNONAA >60 04/20/2014 1019   GFRNONAA >60 11/19/2012 1107   GFRAA 53 (L) 01/13/2020 1410   GFRAA >60 04/20/2014 1019   GFRAA >60 11/19/2012 1107   Lipase     Component Value Date/Time   LIPASE 31 01/01/2021 1023   LIPASE 112 04/20/2014 1019       Studies/Results: DG CHEST PORT 1 VIEW  Result Date: 01/08/2021 CLINICAL DATA:  Endotracheal tube EXAM: PORTABLE CHEST 1 VIEW COMPARISON:  01/07/2021 FINDINGS: No significant change in AP portable chest radiograph, support apparatus including endotracheal tube, left subclavian vascular catheter, esophagogastric tube. Small, layering bilateral pleural effusions. Heart and mediastinum unremarkable. IMPRESSION: 1. No significant change in AP portable chest radiograph, support apparatus including endotracheal tube, left subclavian vascular catheter, esophagogastric tube. 2. Small, layering bilateral pleural effusions. Electronically Signed   By: Jamse Mead.D.  On: 01/08/2021 13:09   ECHOCARDIOGRAM COMPLETE  Result Date: 01/07/2021    ECHOCARDIOGRAM REPORT   Patient Name:   Travis [name] Date of Exam: 01/07/2021 Medical Rec #:  [id number]     Height:       72.0 in Accession #:    [id number]    Weight:       187.6 lb Date of Birth:  06-Apr-1955    BSA:          2.073 m Patient Age:    65 years      BP:           104/55 mmHg Patient Gender: M             HR:           87 bpm. Exam Location:  Inpatient Procedure: 2D Echo, Cardiac Doppler and Color Doppler Indications:    Shock  History:        Patient has prior history of Echocardiogram examinations, most                 recent 11/09/2019. ETOH Abuse.  Sonographer:    Merrie Roof  RDCS Referring Phys: [id number] [place]  1. No change in EF compared to TTE done 11/09/19. Left ventricular ejection fraction, by estimation, is 45 to 50%. The left ventricle has mildly decreased function. The left ventricle demonstrates global hypokinesis. Left ventricular diastolic parameters  were normal.  2. Right ventricular systolic function is normal. The right ventricular size is normal.  3. The mitral valve is normal in structure. Mild mitral valve regurgitation. No evidence of mitral stenosis.  4. The aortic valve was not well visualized. Aortic valve regurgitation is not visualized. Mild aortic valve sclerosis is present, with no evidence of aortic valve stenosis.  5. The inferior vena cava is normal in size with greater than 50% respiratory variability, suggesting right atrial pressure of 3 mmHg. FINDINGS  Left Ventricle: No change in EF compared to TTE done 11/09/19. Left ventricular ejection fraction, by estimation, is 45 to 50%. The left ventricle has mildly decreased function. The left ventricle demonstrates global hypokinesis. The left ventricular internal cavity size was normal in size. There is no left ventricular hypertrophy. Left ventricular diastolic parameters were normal. Right Ventricle: The right ventricular size is normal. No increase in right ventricular wall thickness. Right ventricular systolic function is normal. Left Atrium: Left atrial size was normal in size. Right Atrium: Right atrial size was normal in size. Pericardium: There is no evidence of pericardial effusion. Mitral Valve: The mitral valve is normal in structure. Mild mitral valve regurgitation. No evidence of mitral valve stenosis. Tricuspid Valve: The tricuspid valve is normal in structure. Tricuspid valve regurgitation is not demonstrated. No evidence of tricuspid stenosis. Aortic Valve: The aortic valve was not well visualized. Aortic valve regurgitation is not visualized. Mild aortic valve sclerosis is  present, with no evidence of aortic valve stenosis. Aortic valve mean gradient measures 2.0 mmHg. Aortic valve peak gradient measures 4.6 mmHg. Aortic valve area, by VTI measures 2.61 cm. Pulmonic Valve: The pulmonic valve was normal in structure. Pulmonic valve regurgitation is not visualized. No evidence of pulmonic stenosis. Aorta: The aortic root is normal in size and structure. Venous: The inferior vena cava is normal in size with greater than 50% respiratory variability, suggesting right atrial pressure of 3 mmHg. IAS/Shunts: No atrial level shunt detected by color flow Doppler.  LEFT VENTRICLE PLAX 2D LVIDd:  4.60 cm      Diastology LVIDs:         3.50 cm      LV e' medial:  5.22 cm/s LV PW:         1.00 cm      LV e' lateral: 9.14 cm/s LV IVS:        1.00 cm LVOT diam:     2.30 cm LV SV:         53 LV SV Index:   25 LVOT Area:     4.15 cm  LV Volumes (MOD) LV vol d, MOD A2C: 142.0 ml LV vol d, MOD A4C: 186.0 ml LV vol s, MOD A2C: 69.7 ml LV vol s, MOD A4C: 91.1 ml LV SV MOD A2C:     72.3 ml LV SV MOD A4C:     186.0 ml LV SV MOD BP:      88.1 ml RIGHT VENTRICLE RV Basal diam:  3.60 cm LEFT ATRIUM           Index        RIGHT ATRIUM           Index LA diam:      3.00 cm 1.45 cm/m   RA Area:     15.30 cm LA Vol (A4C): 46.5 ml 22.43 ml/m  RA Volume:   33.70 ml  16.25 ml/m  AORTIC VALVE AV Area (Vmax):    2.88 cm AV Area (Vmean):   2.63 cm AV Area (VTI):     2.61 cm AV Vmax:           107.00 cm/s AV Vmean:          72.400 cm/s AV VTI:            0.202 m AV Peak Grad:      4.6 mmHg AV Mean Grad:      2.0 mmHg LVOT Vmax:         74.10 cm/s LVOT Vmean:        45.800 cm/s LVOT VTI:          0.127 m LVOT/AV VTI ratio: 0.63  AORTA Ao Root diam: 3.20 cm  SHUNTS Systemic VTI:  0.13 m Systemic Diam: 2.30 cm Jenkins Rouge MD Electronically signed by Jenkins Rouge MD Signature Date/Time: 01/07/2021/12:08:21 PM    Final     Anti-infectives: Anti-infectives (From admission, onward)    Start     Dose/Rate  Route Frequency Ordered Stop   01/09/21 0830  ceFEPIme (MAXIPIME) 2 g in sodium chloride 0.9 % 100 mL IVPB        2 g 200 mL/hr over 30 Minutes Intravenous Every 12 hours 01/09/21 0737     01/08/21 1400  metroNIDAZOLE (FLAGYL) IVPB 500 mg        500 mg 100 mL/hr over 60 Minutes Intravenous Every 12 hours 01/08/21 1059     01/08/21 1400  ceFEPIme (MAXIPIME) 2 g in sodium chloride 0.9 % 100 mL IVPB  Status:  Discontinued        2 g 200 mL/hr over 30 Minutes Intravenous Every 24 hours 01/08/21 1114 01/09/21 0737   01/08/21 1000  vancomycin (VANCOREADY) IVPB 750 mg/150 mL        750 mg 150 mL/hr over 60 Minutes Intravenous Every 24 hours 01/07/21 2052     01/08/21 0600  vancomycin (VANCOREADY) IVPB 1500 mg/300 mL  Status:  Discontinued        1,500 mg 150 mL/hr over 120  Minutes Intravenous Every 24 hours 01/07/21 0430 01/07/21 1210   01/08/21 0600  vancomycin (VANCOREADY) IVPB 750 mg/150 mL  Status:  Discontinued        750 mg 150 mL/hr over 60 Minutes Intravenous Every 24 hours 01/07/21 1210 01/07/21 2052   01/07/21 1400  piperacillin-tazobactam (ZOSYN) IVPB 3.375 g  Status:  Discontinued        3.375 g 12.5 mL/hr over 240 Minutes Intravenous Every 8 hours 01/07/21 0430 01/08/21 1114   01/07/21 0530  piperacillin-tazobactam (ZOSYN) IVPB 3.375 g        3.375 g 100 mL/hr over 30 Minutes Intravenous STAT 01/07/21 0430 01/07/21 0622   01/07/21 0530  vancomycin (VANCOREADY) IVPB 1750 mg/350 mL        1,750 mg 175 mL/hr over 120 Minutes Intravenous  Once 01/07/21 0430 01/07/21 0841   01/05/21 1430  Ampicillin-Sulbactam (UNASYN) 3 g in sodium chloride 0.9 % 100 mL IVPB        3 g 200 mL/hr over 30 Minutes Intravenous Every 6 hours 01/05/21 1334 01/06/21 1552   01/01/21 2200  piperacillin-tazobactam (ZOSYN) IVPB 3.375 g        3.375 g 12.5 mL/hr over 240 Minutes Intravenous Every 8 hours 01/01/21 1841 01/04/21 2359   01/01/21 2100  piperacillin-tazobactam (ZOSYN) IVPB 3.375 g  Status:   Discontinued        3.375 g 100 mL/hr over 30 Minutes Intravenous Every 8 hours 01/01/21 1836 01/01/21 1841   01/01/21 1345  piperacillin-tazobactam (ZOSYN) IVPB 3.375 g        3.375 g 100 mL/hr over 30 Minutes Intravenous  Once 01/01/21 1337 01/01/21 1430        Assessment/Plan Mr. Rege is a 65 year old male who presented with perforated appendicitis with abscess.  On 01/01/21 he underwent exploratory laparotomy, ileocecectomy, drainage of intraabdominal abscess with Dr. Grandville Silos.  On 01/06/21, he returned to the OR for exploratory laparotomy, splenectomy and incisional wound vac placement with Dr. Bobbye Morton after a spontaneous splenic rupture.  - EBV panel insufficient sample - NGT with bilious drainage, post-op ileus, await return of bowel function - Cxs w/ pansensitive E.Coli, continue IV Zosyn  - Drain SS this AM, continue drain - VAC change M/W/F to midline - will return to change today - Mobilize with PT/OT - Acute blood loss anemia - monitor and transfuse if needed   FEN: NPO, IVF, NGT LIWS, okay for meds per tube; TPN VTE: SQH started 10/15 ID: Zosyn 10/9>> Dispo: ICU, supportive care, DNR   Remainder of care per primary team: hemorrhagic shock 2/2 above s/p 6 u PRBC, 2 u FFP 10/14 Septic shock 2/2 perforated appendicitis  EtOH abuse Alcohol related cardiomyopathy T2DM Chronic anemia Hx of TIA HTN HLD Gout Hx of GIB CAD Tobacco abuse   LOS: 8 days    Felicie Morn, MD [facility] Surgery 01/09/2021, 9:43 AM Please see Amion for pager number during day hours 7:00am-4:30pm

## 2021-01-09 NOTE — Progress Notes (Addendum)
NAME:  Travis [name], MRN:  [id number], DOB:  Apr 21, 1955, LOS: 17 ADMISSION DATE:  01/01/2021, CONSULTATION DATE:  01/01/2021 REFERRING MD:  Dr Georganna Skeans, CHIEF COMPLAINT:  abdominal pain   History of Present Illness:  Travis [name] is a 65 year old gentleman with an extended PMH that includes but not limited to CHF, CKD 3A, COPD, CRA O, TIA, diabetes, hypertension, gout, hyperlipidemia, CAD (nonobstructive). She presented to the ED on 01/01/21 with complaints of right lower quadrant abdominal pain. He was found to be pyrexial and tachycardiac. Work up included a CT scan of the abdomen and pelvis which revealed:  "Circumferential mucosal thickening and indistinctness of the bowel wall at the base of the appendix and the distal cecum at the ileocecal junction. There is an associated milder mucosal thickening and indistinctness of the terminal ileum. The distal appendix is decompressed, contains gas and fecal matter and overall has normal appearance. Small amount of free fluid and free gas in the right lower quadrant of the abdomen. These findings are likely due to acute appendicitis with perforation. It is unclear however whether the mucosal thickening of the cecum is reactive or is the source of appendiceal obstruction. Underlying malignancy such as mucinous tumor is in the differential diagnosis. 2. Small foci of free intraperitoneal gas and small amount of fluid scattered in the mesentery of the right lower and upper quadrants of the abdomen, and in perihepatic location."  Patient was therefore taken to the operating room where he was found to have a perforated appendix and abdominal abscess. He is S/P DIAGNOSTIC LAPAROSCOPY; EXPLORATORY LAPAROTOMY, ILECECECTOMY, DRAINAGE OF INTRA-ABDOMINAL ABSCESS  The patient is now in the ICU and on mechanical ventilation.  He arrived on Neosynephrine drip for vasopressor support and on IVF.  I have seen and examined the patient.  Pertinent   Medical History  CKD 3A COPD TIA DM  Significant Hospital Events: Including procedures, antibiotic start and stop dates in addition to other pertinent events   10/9: ex lap, remained intubated 10/10: extubated 10/12 transferred to [facility] 10/14 0130 hypotensive with worsening abd pain, surgery called and asked for repeat CT scan.  Started on Levophed.  Back to PCCM service. 6U pRBC total, 4U FFP total. To OR for splenectomy. 10/16 Weaned off pressors. Self-extubated   Interim History / Subjective:  Weaned off pressors yesterday PM.  Self-extubated yesterday and rmained on 7-8L via [place]. Sister and niece at bedside. Patient recognizes family members and reports year is 2020  Objective   Blood pressure (!) 144/81, pulse (!) 116, temperature 100.2 F (37.9 C), resp. rate (!) 26, height 6' (1.829 m), weight 91.2 kg, SpO2 93 %.    Vent Mode: PRVC FiO2 (%):  [36 %-60 %] 36 % Set Rate:  [20 bmp] 20 bmp Vt Set:  [620 mL] 620 mL PEEP:  [5 cmH20] 5 cmH20 Plateau Pressure:  [17 cmH20] 17 cmH20   Intake/Output Summary (Last 24 hours) at 01/09/2021 0944 Last data filed at 01/09/2021 0600 Gross per 24 hour  Intake 1301.45 ml  Output 1350 ml  Net -48.55 ml   Filed Weights   01/05/21 0328 01/06/21 0438 01/08/21 0356  Weight: 85.9 kg 85.1 kg 91.2 kg   Physical Exam: General: Chronically ill-appearing, thin, no acute distress HENT: [place], AT, OP clear, MMM Eyes: EOMI, no scleral icterus Respiratory: Anterior rhonchi bilaterally Cardiovascular: RRR, -M/R/G, no JVD GI: BS+, soft, midline incision with dry dressing in place Extremities:-Edema,-tenderness Neuro: AAO x2, CNII-XII grossly intact  GU: Foley in place  CXR 01/08/21 - Small bilateral pleural effusions. Left subclavian  WBC increasing 22 BUN/Cr improving  Bcx 10/9 +Bacteroides Abdominal Cx 10/9 Pan-sensitive E.coli and enterococcus Final  TTE with EF 45-50%, global hypokinesis  Assessment & Plan:   Severe sepsis/septic shock  secondary to intra-abdominal infection (E.colic and enteroccus) in setting of ruptured appendix and bacteroides species bacteremia Presented with septic shock in setting ruptured appendix course c/b hemorrhagic shock from spontaneous splenic rupture 10/14 with deterioration post splenectomy either from vasoplegia related to hemorrhagic shock or possible intraabdominal infection. Worsening leukocytosis however shock has resolved - Off pressors 10/16 - Return to Zosyn to include enterococcus coverage - Obtain blood cultures for clearance - Tylenol for fever  Acute respiratory failure pleural effusions, atelectasis Self extubated 10/16. 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 TPN until bowel function returns. Will change wound vac today - Zosyn  Hypertension - Start home coreg  AKI on CKD IIIa - improving - Follow-up UOP/Cr - Avoid nephrotoxic agents  Hx of Type 2 DM. - SSI  Hx of nonischemic cardiomyopathy, non obstructive CAD, HTN. - Continue to hold home meds  Acute metabolic encephalopathy - improving - Re-orient - Management as noted above  Best Practice (right click and "Reselect all SmartList Selections" daily)   Diet/type: NPO and TPN DVT prophylaxis: prophylactic heparin  GI prophylaxis: H2B Lines: yes and it is still needed Foley:  Yes, and it is still needed Code Status:  DNR Last date of multidisciplinary goals of care discussion: with niece [name] 10/14, 10/15. 10/16 patient self extubated and confirmed DNR however wishes to continue current care  CC time: 45 min.   The patient is critically ill with high risk for deterioration for bacteremia, intra-abdominal abscess, respiratory failure and renal  failure and requires high complexity decision making for assessment and support, frequent evaluation and titration of therapies, application of advanced monitoring technologies and extensive interpretation of multiple databases.  Independent Critical Care Time: 74 Minutes.   [name], M.D. [facility] Pulmonary/Critical Care Medicine 01/09/2021 10:09 AM   Please see Amion for pager number to reach on-call Pulmonary and Critical Care Team.

## 2021-01-10 ENCOUNTER — Inpatient Hospital Stay (HOSPITAL_COMMUNITY): Payer: Medicare HMO

## 2021-01-10 DIAGNOSIS — K3532 Acute appendicitis with perforation and localized peritonitis, without abscess: Secondary | ICD-10-CM | POA: Diagnosis not present

## 2021-01-10 LAB — TYPE AND SCREEN
ABO/RH(D): O POS
Antibody Screen: NEGATIVE
Unit division: 0
Unit division: 0
Unit division: 0
Unit division: 0
Unit division: 0
Unit division: 0
Unit division: 0
Unit division: 0
Unit division: 0
Unit division: 0

## 2021-01-10 LAB — CBC WITH DIFFERENTIAL/PLATELET
Abs Immature Granulocytes: 0.19 10*3/uL — ABNORMAL HIGH (ref 0.00–0.07)
Basophils Absolute: 0 10*3/uL (ref 0.0–0.1)
Basophils Relative: 0 %
Eosinophils Absolute: 0.2 10*3/uL (ref 0.0–0.5)
Eosinophils Relative: 1 %
HCT: 25.2 % — ABNORMAL LOW (ref 39.0–52.0)
Hemoglobin: 8.1 g/dL — ABNORMAL LOW (ref 13.0–17.0)
Immature Granulocytes: 1 %
Lymphocytes Relative: 5 %
Lymphs Abs: 1.1 10*3/uL (ref 0.7–4.0)
MCH: 30.3 pg (ref 26.0–34.0)
MCHC: 32.1 g/dL (ref 30.0–36.0)
MCV: 94.4 fL (ref 80.0–100.0)
Monocytes Absolute: 0.9 10*3/uL (ref 0.1–1.0)
Monocytes Relative: 4 %
Neutro Abs: 18.1 10*3/uL — ABNORMAL HIGH (ref 1.7–7.7)
Neutrophils Relative %: 89 %
Platelets: 344 10*3/uL (ref 150–400)
RBC: 2.67 MIL/uL — ABNORMAL LOW (ref 4.22–5.81)
RDW: 16.9 % — ABNORMAL HIGH (ref 11.5–15.5)
WBC: 20.5 10*3/uL — ABNORMAL HIGH (ref 4.0–10.5)
nRBC: 0.3 % — ABNORMAL HIGH (ref 0.0–0.2)

## 2021-01-10 LAB — BPAM RBC
Blood Product Expiration Date: 202211052359
Blood Product Expiration Date: 202211072359
Blood Product Expiration Date: 202211072359
Blood Product Expiration Date: 202211072359
Blood Product Expiration Date: 202211072359
Blood Product Expiration Date: 202211072359
Blood Product Expiration Date: 202211072359
Blood Product Expiration Date: 202211102359
Blood Product Expiration Date: 202211102359
Blood Product Expiration Date: 202211102359
ISSUE DATE / TIME: 202210140228
ISSUE DATE / TIME: 202210140228
ISSUE DATE / TIME: 202210140428
ISSUE DATE / TIME: 202210140428
ISSUE DATE / TIME: 202210140549
ISSUE DATE / TIME: 202210140549
ISSUE DATE / TIME: 202210140549
ISSUE DATE / TIME: 202210140549
Unit Type and Rh: 5100
Unit Type and Rh: 5100
Unit Type and Rh: 5100
Unit Type and Rh: 5100
Unit Type and Rh: 5100
Unit Type and Rh: 5100
Unit Type and Rh: 5100
Unit Type and Rh: 5100
Unit Type and Rh: 5100
Unit Type and Rh: 5100

## 2021-01-10 LAB — GLUCOSE, CAPILLARY
Glucose-Capillary: 171 mg/dL — ABNORMAL HIGH (ref 70–99)
Glucose-Capillary: 160 mg/dL — ABNORMAL HIGH (ref 70–99)
Glucose-Capillary: 214 mg/dL — ABNORMAL HIGH (ref 70–99)
Glucose-Capillary: 143 mg/dL — ABNORMAL HIGH (ref 70–99)
Glucose-Capillary: 142 mg/dL — ABNORMAL HIGH (ref 70–99)
Glucose-Capillary: 203 mg/dL — ABNORMAL HIGH (ref 70–99)

## 2021-01-10 LAB — COMPREHENSIVE METABOLIC PANEL
ALT: 17 U/L (ref 0–44)
AST: 21 U/L (ref 15–41)
Albumin: 1.5 g/dL — ABNORMAL LOW (ref 3.5–5.0)
Alkaline Phosphatase: 116 U/L (ref 38–126)
Anion gap: 10 (ref 5–15)
BUN: 26 mg/dL — ABNORMAL HIGH (ref 8–23)
CO2: 22 mmol/L (ref 22–32)
Calcium: 8.7 mg/dL — ABNORMAL LOW (ref 8.9–10.3)
Chloride: 114 mmol/L — ABNORMAL HIGH (ref 98–111)
Creatinine, Ser: 1.29 mg/dL — ABNORMAL HIGH (ref 0.61–1.24)
GFR, Estimated: >60 mL/min (ref >60)
Glucose, Bld: 182 mg/dL — ABNORMAL HIGH (ref 70–99)
Potassium: 3.8 mmol/L (ref 3.5–5.1)
Sodium: 146 mmol/L — ABNORMAL HIGH (ref 135–145)
Total Bilirubin: 1.4 mg/dL — ABNORMAL HIGH (ref 0.3–1.2)
Total Protein: 5.4 g/dL — ABNORMAL LOW (ref 6.5–8.1)

## 2021-01-10 LAB — MAGNESIUM: Magnesium: 2.3 mg/dL (ref 1.7–2.4)

## 2021-01-10 LAB — TRIGLYCERIDES: Triglycerides: 223 mg/dL — ABNORMAL HIGH (ref <150)

## 2021-01-10 LAB — PHOSPHORUS: Phosphorus: 2.3 mg/dL — ABNORMAL LOW (ref 2.5–4.6)

## 2021-01-10 MED ORDER — HYDROMORPHONE HCL 1 MG/ML IJ SOLN
0.5000 mg | INTRAMUSCULAR | Status: AC | PRN
  Administered 2021-01-10 (×2): 0.5 mg via INTRAVENOUS
  Filled 2021-01-10 (×2): qty 0.5

## 2021-01-10 MED ORDER — POTASSIUM CHLORIDE 10 MEQ/50ML IV SOLN
10.0000 meq | INTRAVENOUS | Status: AC
  Administered 2021-01-10 (×2): 10 meq via INTRAVENOUS
  Filled 2021-01-10 (×2): qty 50

## 2021-01-10 MED ORDER — TRAVASOL 10 % IV SOLN
INTRAVENOUS | Status: AC
  Filled 2021-01-10: qty 1036.8

## 2021-01-10 MED ORDER — POTASSIUM PHOSPHATES 15 MMOLE/5ML IV SOLN
15.0000 mmol | Freq: Once | INTRAVENOUS | Status: AC
  Administered 2021-01-10: 15 mmol via INTRAVENOUS
  Filled 2021-01-10: qty 5

## 2021-01-10 MED ORDER — HYDROMORPHONE HCL 1 MG/ML IJ SOLN
0.2000 mg | INTRAMUSCULAR | Status: AC | PRN
  Administered 2021-01-10 (×2): 0.2 mg via INTRAVENOUS
  Filled 2021-01-10 (×2): qty 0.5

## 2021-01-10 MED ORDER — METHOCARBAMOL 1000 MG/10ML IJ SOLN
500.0000 mg | Freq: Four times a day (QID) | INTRAVENOUS | Status: DC
  Administered 2021-01-10 – 2021-01-12 (×8): 500 mg via INTRAVENOUS
  Filled 2021-01-10: qty 500
  Filled 2021-01-10: qty 5
  Filled 2021-01-10 (×4): qty 500
  Filled 2021-01-10 (×2): qty 5
  Filled 2021-01-10 (×2): qty 500
  Filled 2021-01-10 (×2): qty 5

## 2021-01-10 MED ORDER — ACETAMINOPHEN 500 MG PO TABS
1000.0000 mg | ORAL_TABLET | Freq: Four times a day (QID) | ORAL | Status: DC
  Administered 2021-01-10 – 2021-01-12 (×8): 1000 mg
  Filled 2021-01-10 (×8): qty 2

## 2021-01-10 MED ORDER — OXYCODONE HCL 5 MG PO TABS
5.0000 mg | ORAL_TABLET | Freq: Four times a day (QID) | ORAL | Status: DC | PRN
  Administered 2021-01-10 – 2021-01-11 (×3): 5 mg
  Filled 2021-01-10 (×3): qty 1

## 2021-01-10 MED ORDER — CARVEDILOL 12.5 MG PO TABS: 6.2500 mg | ORAL_TABLET | Freq: Once | ORAL | Status: AC

## 2021-01-10 MED ORDER — CARVEDILOL 12.5 MG PO TABS
12.5000 mg | ORAL_TABLET | Freq: Two times a day (BID) | ORAL | Status: DC
  Administered 2021-01-10 – 2021-01-15 (×11): 12.5 mg
  Filled 2021-01-10 (×11): qty 1

## 2021-01-10 MED ORDER — PANTOPRAZOLE 2 MG/ML SUSPENSION
40.0000 mg | Freq: Every day | ORAL | Status: DC
  Administered 2021-01-11 – 2021-01-12 (×2): 40 mg
  Filled 2021-01-10 (×2): qty 20

## 2021-01-10 NOTE — Evaluation (Addendum)
Physical Therapy Evaluation Patient Details Name: Travis [name] MRN: [id number] DOB: 07-28-1955 Today's Date: 01/10/2021  History of Present Illness  Pt is a 65 y/o male admitted 10/9 with complaints of R lower quadrant abdominal pain. Found with intra-abdominal sepsis with perforated appendix and abdominal abscess; s/p diagnostic laparoscopy, ileocecectomy, drainage of interaabdominal abscess 10/9.  Post op ileus. 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 Pt self extubated 10/16. PMHx: CHF, CKD, COPD, TIA, diabetes, HTN, gout, CAD.  Clinical Impression  Pt pleasant but confused with generalized weakness, deconditioning, decreased ability with transfers, function and cognition who will benefit from acute therapy to maximize mobility, safety and function. Pt currently stating year as 2027 and demonstrates decreased awareness of deficits and safety. Encouraged HEP at bed level and assisting mobility acutely.   SpO2 90-96% on 6L HFNC HR 103       Recommendations for follow up therapy are one component of a multi-disciplinary discharge planning process, led by the attending physician.  Recommendations may be updated based on patient status, additional functional criteria and insurance authorization.  Follow Up Recommendations SNF;Supervision/Assistance - 24 hour    Equipment Recommendations  Other (comment) (TBD with progression)    Recommendations for Other Services OT consult     Precautions / Restrictions Precautions Precautions: Fall Precaution Comments: abdominal incision, JP drain R, NG tube, wound VAC      Mobility  Bed Mobility Overal bed mobility: Needs Assistance Bed Mobility: Rolling;Supine to Sit Rolling: Max assist   Supine to sit: Mod assist;HOB elevated Sit to supine: Mod assist   General bed mobility comments: attempted rolling to left x 2 with max multimodal cues and pad with pt resistant to trunk movement due to pain. Pivot supine to sit with HOB 25  degrees with mod assist and increased time. Return to supine with assist to elevate legs to surface. Pt was able to scoot EOB toward [facility] with min assist    Transfers                 General transfer comment: pt unable to attempt  Ambulation/Gait             General Gait Details: unable  Stairs            Wheelchair Mobility    Modified Rankin (Stroke Patients Only)       Balance Overall balance assessment: Needs assistance Sitting-balance support: Feet supported;Bilateral upper extremity supported Sitting balance-Leahy Scale: Poor Sitting balance - Comments: pt sitting EOB with bil UE support                                     Pertinent Vitals/Pain Pain Score: 6  Pain Location: stomach Pain Descriptors / Indicators: Discomfort;Moaning;Guarding Pain Intervention(s): Limited activity within patient's tolerance;Monitored during session;Premedicated before session;Repositioned    Home Living Family/patient expects to be discharged to:: Private residence Living Arrangements: Alone Available Help at Discharge: Family;Available PRN/intermittently Type of Home: Apartment Home Access: Stairs to enter Entrance Stairs-Rails: Right Entrance Stairs-Number of Steps: 2 Home Layout: One level Home Equipment: Cane - single point;Grab bars - toilet      Prior Function Level of Independence: Independent         Comments: used cane intermittently as needed, independent ADLs, IADLs, and driving  but not working     Hand Dominance        Extremity/Trunk Assessment   Upper Extremity Assessment  Upper Extremity Assessment: Generalized weakness    Lower Extremity Assessment Lower Extremity Assessment: Generalized weakness    Cervical / Trunk Assessment Cervical / Trunk Assessment: Normal  Communication   Communication: No difficulties  Cognition Arousal/Alertness: Awake/alert Behavior During Therapy: Flat affect Overall Cognitive  Status: Impaired/Different from baseline Area of Impairment: Orientation;Safety/judgement;Attention;Memory;Following commands;Problem solving                 Orientation Level: Disoriented to;Time;Situation;Place Current Attention Level: Sustained Memory: Decreased recall of precautions;Decreased short-term memory Following Commands: Follows one step commands inconsistently;Follows one step commands with increased time Safety/Judgement: Decreased awareness of safety;Decreased awareness of deficits   Problem Solving: Slow processing;Decreased initiation;Difficulty sequencing;Requires verbal cues;Requires tactile cues General Comments: pt oriented to self and that he is in the hospital but inaccurate facility and stating year as 2027      General Comments      Exercises General Exercises - Lower Extremity Heel Slides: AAROM;5 reps;Supine;Both   Assessment/Plan    PT Assessment Patient needs continued PT services  PT Problem List Decreased activity tolerance;Decreased mobility;Pain;Decreased strength;Decreased balance;Decreased cognition;Cardiopulmonary status limiting activity;Decreased coordination;Decreased safety awareness;Decreased skin integrity;Decreased knowledge of use of DME       PT Treatment Interventions Gait training;Functional mobility training;Therapeutic activities;Therapeutic exercise;Balance training;Patient/family education;DME instruction;Neuromuscular re-education;Cognitive remediation    PT Goals (Current goals can be found in the Care Plan section)  Acute Rehab PT Goals Patient Stated Goal: to get better PT Goal Formulation: With patient Time For Goal Achievement: 01/24/21 Potential to Achieve Goals: Fair    Frequency Min 3X/week   Barriers to discharge Decreased caregiver support      Co-evaluation               AM-PAC PT "6 Clicks" Mobility  Outcome Measure Help needed turning from your back to your side while in a flat bed without  using bedrails?: A Lot Help needed moving from lying on your back to sitting on the side of a flat bed without using bedrails?: A Lot Help needed moving to and from a bed to a chair (including a wheelchair)?: Total Help needed standing up from a chair using your arms (e.g., wheelchair or bedside chair)?: Total Help needed to walk in hospital room?: Total Help needed climbing 3-5 steps with a railing? : Total 6 Click Score: 8    End of Session Equipment Utilized During Treatment: Oxygen Activity Tolerance: Patient limited by fatigue;Patient limited by pain Patient left: in bed;with call bell/phone within reach;with bed alarm set Nurse Communication: Mobility status;Need for lift equipment PT Visit Diagnosis: Difficulty in walking, not elsewhere classified (R26.2);Other abnormalities of gait and mobility (R26.89);Muscle weakness (generalized) (M62.81)    Time: [id number] PT Time Calculation (min) (ACUTE ONLY): 24 min   Charges:   PT Evaluation $PT Eval Moderate Complexity: 1 Mod PT Treatments $Therapeutic Activity: 8-22 mins        Norina Cowper P, PT Acute Rehabilitation Services Pager: [phone] Office: [phone]   Sandy Salaam [place] 01/10/2021, 3:23 PM

## 2021-01-10 NOTE — Progress Notes (Addendum)
Progress Note   Subjective: C/o abdominal pain.  Some tachypnea, on 8L HF O2.  Some flatus.  NGT with 40cc documented yesterday.  Some confusion overnight.    Objective: Vital signs in last 24 hours: Temp:  [99.1 F (37.3 C)-100.8 F (38.2 C)] 99.3 F (37.4 C) (10/18 0600) Pulse Rate:  [99-133] 104 (10/18 0742) Resp:  [22-33] 23 (10/18 0600) BP: (125-155)/(68-83) 143/74 (10/18 0742) SpO2:  [91 %-97 %] 95 % (10/18 0600) Weight:  [91 kg] 91 kg (10/18 0500) Last BM Date: 01/08/21  Intake/Output from previous day: 10/17 0701 - 10/18 0700 In: 1645.1 [I.V.:1268; IV Piggyback:377.2] Out: [id number] [Urine:1740; Emesis/NG output:40; Drains:10] Intake/Output this shift: No intake/output data recorded.  PE: Abd: soft, appropriately ttp, some BS, mild distention, minimal NGT output, mostly bile tinged spit.  VAC in place.  JP with SS output Psych: oriented to year and situation sort of.  Didn't know president or where he is  Lab Results:  Recent Labs    01/09/21 0332 01/10/21 0219  WBC 22.7* 20.5*  HGB 8.4* 8.1*  HCT 25.2* 25.2*  PLT 297 344   BMET Recent Labs    01/09/21 0332 01/10/21 0219  NA 142 146*  K 3.6 3.8  CL 111 114*  CO2 22 22  GLUCOSE 152* 182*  BUN 40* 26*  CREATININE 1.84* 1.29*  CALCIUM 8.3* 8.7*   PT/INR No results for input(s): LABPROT, INR in the last 72 hours.  CMP     Component Value Date/Time   NA 146 (H) 01/10/2021 0219   NA 138 06/20/2020 1034   NA 141 04/20/2014 1019   K 3.8 01/10/2021 0219   K 3.7 04/20/2014 1019   CL 114 (H) 01/10/2021 0219   CL 106 04/20/2014 1019   CO2 22 01/10/2021 0219   CO2 30 04/20/2014 1019   GLUCOSE 182 (H) 01/10/2021 0219   GLUCOSE 102 (H) 04/20/2014 1019   BUN 26 (H) 01/10/2021 0219   BUN 18 06/20/2020 1034   BUN 14 04/20/2014 1019   CREATININE 1.29 (H) 01/10/2021 0219   CREATININE 1.18 04/20/2014 1019   CALCIUM 8.7 (L) 01/10/2021 0219   CALCIUM 9.3 04/20/2014 1019   PROT 5.4 (L) 01/10/2021 0219    PROT 6.5 06/18/2017 1425   PROT 7.4 04/20/2014 1019   ALBUMIN 1.5 (L) 01/10/2021 0219   ALBUMIN 4.0 06/18/2017 1425   ALBUMIN 3.4 04/20/2014 1019   AST 21 01/10/2021 0219   AST 21 04/20/2014 1019   ALT 17 01/10/2021 0219   ALT 20 04/20/2014 1019   ALKPHOS 116 01/10/2021 0219   ALKPHOS 97 04/20/2014 1019   BILITOT 1.4 (H) 01/10/2021 0219   BILITOT 0.4 06/18/2017 1425   BILITOT 0.6 04/20/2014 1019   GFRNONAA >60 01/10/2021 0219   GFRNONAA >60 04/20/2014 1019   GFRNONAA >60 11/19/2012 1107   GFRAA 53 (L) 01/13/2020 1410   GFRAA >60 04/20/2014 1019   GFRAA >60 11/19/2012 1107   Lipase     Component Value Date/Time   LIPASE 31 01/01/2021 1023   LIPASE 112 04/20/2014 1019       Studies/Results: DG CHEST PORT 1 VIEW  Result Date: 01/08/2021 CLINICAL DATA:  Endotracheal tube EXAM: PORTABLE CHEST 1 VIEW COMPARISON:  01/07/2021 FINDINGS: No significant change in AP portable chest radiograph, support apparatus including endotracheal tube, left subclavian vascular catheter, esophagogastric tube. Small, layering bilateral pleural effusions. Heart and mediastinum unremarkable. IMPRESSION: 1. No significant change in AP portable chest radiograph, support apparatus including endotracheal tube,  left subclavian vascular catheter, esophagogastric tube. 2. Small, layering bilateral pleural effusions. Electronically Signed   By: Delanna Ahmadi M.D.   On: 01/08/2021 13:09    Anti-infectives: Anti-infectives (From admission, onward)    Start     Dose/Rate Route Frequency Ordered Stop   01/09/21 1100  piperacillin-tazobactam (ZOSYN) IVPB 3.375 g        3.375 g 12.5 mL/hr over 240 Minutes Intravenous Every 8 hours 01/09/21 1000     01/09/21 0830  ceFEPIme (MAXIPIME) 2 g in sodium chloride 0.9 % 100 mL IVPB  Status:  Discontinued        2 g 200 mL/hr over 30 Minutes Intravenous Every 12 hours 01/09/21 0737 01/09/21 1000   01/08/21 1400  metroNIDAZOLE (FLAGYL) IVPB 500 mg  Status:  Discontinued         500 mg 100 mL/hr over 60 Minutes Intravenous Every 12 hours 01/08/21 1059 01/09/21 1000   01/08/21 1400  ceFEPIme (MAXIPIME) 2 g in sodium chloride 0.9 % 100 mL IVPB  Status:  Discontinued        2 g 200 mL/hr over 30 Minutes Intravenous Every 24 hours 01/08/21 1114 01/09/21 0737   01/08/21 1000  vancomycin (VANCOREADY) IVPB 750 mg/150 mL  Status:  Discontinued        750 mg 150 mL/hr over 60 Minutes Intravenous Every 24 hours 01/07/21 2052 01/09/21 1000   01/08/21 0600  vancomycin (VANCOREADY) IVPB 1500 mg/300 mL  Status:  Discontinued        1,500 mg 150 mL/hr over 120 Minutes Intravenous Every 24 hours 01/07/21 0430 01/07/21 1210   01/08/21 0600  vancomycin (VANCOREADY) IVPB 750 mg/150 mL  Status:  Discontinued        750 mg 150 mL/hr over 60 Minutes Intravenous Every 24 hours 01/07/21 1210 01/07/21 2052   01/07/21 1400  piperacillin-tazobactam (ZOSYN) IVPB 3.375 g  Status:  Discontinued        3.375 g 12.5 mL/hr over 240 Minutes Intravenous Every 8 hours 01/07/21 0430 01/08/21 1114   01/07/21 0530  piperacillin-tazobactam (ZOSYN) IVPB 3.375 g        3.375 g 100 mL/hr over 30 Minutes Intravenous STAT 01/07/21 0430 01/07/21 0622   01/07/21 0530  vancomycin (VANCOREADY) IVPB 1750 mg/350 mL        1,750 mg 175 mL/hr over 120 Minutes Intravenous  Once 01/07/21 0430 01/07/21 0841   01/05/21 1430  Ampicillin-Sulbactam (UNASYN) 3 g in sodium chloride 0.9 % 100 mL IVPB        3 g 200 mL/hr over 30 Minutes Intravenous Every 6 hours 01/05/21 1334 01/06/21 1552   01/01/21 2200  piperacillin-tazobactam (ZOSYN) IVPB 3.375 g        3.375 g 12.5 mL/hr over 240 Minutes Intravenous Every 8 hours 01/01/21 1841 01/04/21 2359   01/01/21 2100  piperacillin-tazobactam (ZOSYN) IVPB 3.375 g  Status:  Discontinued        3.375 g 100 mL/hr over 30 Minutes Intravenous Every 8 hours 01/01/21 1836 01/01/21 1841   01/01/21 1345  piperacillin-tazobactam (ZOSYN) IVPB 3.375 g        3.375 g 100 mL/hr over  30 Minutes Intravenous  Once 01/01/21 1337 01/01/21 1430        Assessment/Plan Perforated appendicitis with abscess POD 9, s/p exploratory laparotomy, ileocecectomy, drainage of intraabdominal abscess 01/01/21 Dr. Grandville Silos - NGT with bilious drainage, post-op ileus, some bowel function returning.  Will try to clamp NGT today and see how he does - Cxs  w/ pansensitive E.Coli, continue IV Zosyn  - Drain SS this AM, continue drain, but can likely DC soon after starting diet. - VAC change M/W/F to midline - Recommend initiation of PT/OT for mobilization -adjust pain meds for better pain control   Splenic laceration  POD 4 s/p ex lap, splenectomy, application of incisional wound vac 10/14. Dr. Bobbye Morton  - hgb stable around 8 - post-op ileus course as above -will need vaccines prior to discharge   FEN: TNA, clamp NGT, may have sips and chips VTE: SQH  ID: Zosyn 10/9>> ? Duration, WBC up but may likely be secondary to splenectomy Dispo: ICU, DNR    Below per primary team: hemorrhagic shock 2/2 above s/p 6 u PRBC, 2 u FFP 10/14 Septic shock 2/2 perforated appendicitis  EtOH abuse Alcohol related cardiomyopathy T2DM Chronic anemia Hx of TIA HTN HLD Gout Hx of GIB CAD Tobacco abuse   LOS: 9 days    Travis [name], [facility] Surgery 01/10/2021, 7:47 AM Please see Amion for pager number during day hours 7:00am-4:30pm

## 2021-01-10 NOTE — Progress Notes (Addendum)
PHARMACY - TOTAL PARENTERAL NUTRITION CONSULT NOTE   Indication: Prolonged ileus  Patient Measurements: Height: 6' (182.9 cm) Weight: 91 kg (200 lb 9.9 oz) IBW/kg (Calculated) : 77.6   Body mass index is 27.21 kg/m.  Assessment: 67 yom presenting 10/9 with acute perforated appendix, and abdominal abscess s/p OR for ex-lap, ilececectomy, and drainage of abscess on 10/9. Also had splenic laceration, s/p ex-lap, splenectomy, and wound vac application [id number]. Transferred to ICU on 10/12 with septic vs. hemorrhagic shock, intubated and on vasopressor support. Plan was initially to transition to comfort measures 10/14; however improved overnight with decreasing vasopressor requirement. Post-op ileus. Pharmacy consulted to start TPN.  Noted - BM documented since TPN consult placed. Per discussion with Surgery, will be at least 48 hrs until tube feeds started and likely longer until at goal. TPN will still be started today with patient already at least 8 days without nutrition.  Glucose / Insulin: hx DM2 (no meds documented PTA). CBGs controlled 130-150s prior to TPN start. CBGs 160-214 since TPN rate increase with two CBGs of 214. Utilized 23 units SSI in last 24hrs Electrolytes: Na 146, K 3.8 (goal >/=4 with ileus; s/p Kcl 10 meq IV x3), Cl 114, CoCa >10 (Ca removed on 10/17), Phos 2.3 - decrease from 3.1, Mg 2.3 - decrease from 2.9 Renal: AKI on CKD improving and now close to baseline. Scr 1.29. BUN 26 - decrease.  Hepatic: LFTs wnl. TG 223 - decreased from 10/17 but increased from 138 on 10/10. Tbili down to 1.4, albumin <1.5 Intake / Output; MIVF: UOP 0.8 ml/kg/hr, NGT output 40 ml - NGT clamped today. JP drain output 10 ml; LBM 10/16 GI Imaging: none since TPN start GI Surgeries / Procedures: none since TPN start  Central access: CVC triple lumen 10/14 TPN start date: 01/08/21  Nutritional Goals: Goal TPN rate is 100 mL/hr (provides 130 g of protein and 2421 kcals per day)  RD  Assessment: Estimated Needs Total Energy Estimated Needs: 2400-2600 Total Protein Estimated Needs: 125-150 grams Total Fluid Estimated Needs: > 2 L  Current Nutrition:  NPO  Plan:  Increase TPN to new goal rate of 33m/hr at 1800 based on updated RD recommendations on 10/17 and expect hopefully can increase to goal rate tomorrow pending repeat electrolytes and CBGs Electrolytes in TPN: remove Na, add back Mg at 2 mEq/L. Continue K 50 mEq/L (increased with rate increase), continue Ca 0 mEq/L. Adjust Cl:Ac to max acetate.  K Phos 15 mmol x1 (contains ~22 mEq of K) Kcl 10 mEq IV x2 Add standard MVI and trace elements to TPN + folic acid and thiamine with hx ETOH abuse Continue resistant SSI q4h and add 15 units of insulin to TPN Monitor TPN labs on Mon/Thurs, repeat electrolytes tomorrow Repeat TG tomorrow morning and continue to monitor closely for need to give lipids on MWF F/u return of bowel function, ability to initiate tube feeds and wean off TPN  GCristela Felt PharmD, BCPS Clinical Pharmacist 01/10/2021 7:49 AM

## 2021-01-10 NOTE — Progress Notes (Addendum)
[place] Progress Note Patient Name: Travis [name] DOB: Sep 07, 1955 MRN: [id number]   Date of Service  01/10/2021  HPI/Events of Note  Notified of uncontrolled pain. Pt with multiple abdominal surgeries.  Pt received dilaudid last night which helped more than fentanyl.   eICU Interventions  Dilaudid IV prn ordered.  Fentanyl discontinued,      Intervention Category Intermediate Interventions: Pain - evaluation and management  Elsie Lincoln 01/10/2021, 12:35 AM  5:11 AM PT still complaining of pain.  Plan> Trial of higher dose of dilaudid at 0.5 IV PRN.

## 2021-01-10 NOTE — Progress Notes (Addendum)
NAME:  Travis [name], MRN:  [id number], DOB:  December 11, 1955, LOS: 9 ADMISSION DATE:  01/01/2021, CONSULTATION DATE:  01/01/2021 REFERRING MD:  Dr Georganna Skeans, CHIEF COMPLAINT:  abdominal pain   History of Present Illness:  Travis [name]. Hise is a 65 year old gentleman with an extended PMH that includes but not limited to CHF, CKD 3A, COPD, CRA O, TIA, diabetes, hypertension, gout, hyperlipidemia, CAD (nonobstructive). She presented to the ED on 01/01/21 with complaints of right lower quadrant abdominal pain. He was found to be pyrexial and tachycardiac. Work up included a CT scan of the abdomen and pelvis which revealed:  "Circumferential mucosal thickening and indistinctness of the bowel wall at the base of the appendix and the distal cecum at the ileocecal junction. There is an associated milder mucosal thickening and indistinctness of the terminal ileum. The distal appendix is decompressed, contains gas and fecal matter and overall has normal appearance. Small amount of free fluid and free gas in the right lower quadrant of the abdomen. These findings are likely due to acute appendicitis with perforation. It is unclear however whether the mucosal thickening of the cecum is reactive or is the source of appendiceal obstruction. Underlying malignancy such as mucinous tumor is in the differential diagnosis. 2. Small foci of free intraperitoneal gas and small amount of fluid scattered in the mesentery of the right lower and upper quadrants of the abdomen, and in perihepatic location."  Patient was therefore taken to the operating room where he was found to have a perforated appendix and abdominal abscess. He is S/P DIAGNOSTIC LAPAROSCOPY; EXPLORATORY LAPAROTOMY, ILECECECTOMY, DRAINAGE OF INTRA-ABDOMINAL ABSCESS  The patient is now in the ICU and on mechanical ventilation.  He arrived on Neosynephrine drip for vasopressor support and on IVF.  I have seen and examined the patient.  Pertinent   Medical History  CKD 3A COPD TIA DM  Significant Hospital Events: Including procedures, antibiotic start and stop dates in addition to other pertinent events   10/9: ex lap, remained intubated 10/10: extubated 10/12 transferred to [facility] 10/14 0130 hypotensive with worsening abd pain, surgery called and asked for repeat CT scan.  Started on Levophed.  Back to PCCM service. 6U pRBC total, 4U FFP total. To OR for splenectomy. 10/16 Weaned off pressors. Self-extubated  10/18 Increased O2 requirement to 8L  Interim History / Subjective:  Increased O2 requirement to 8L Continues to have back and abdominal pain. Changed pain meds to Dilaudid PRN overnight  Objective   Blood pressure 136/79, pulse (!) 101, temperature 99.7 F (37.6 C), resp. rate (!) 22, height 6' (1.829 m), weight 91 kg, SpO2 95 %.        Intake/Output Summary (Last 24 hours) at 01/10/2021 1022 Last data filed at 01/10/2021 0900 Gross per 24 hour  Intake 1623.74 ml  Output 1595 ml  Net 28.74 ml   Filed Weights   01/06/21 0438 01/08/21 0356 01/10/21 0500  Weight: 85.1 kg 91.2 kg 91 kg    Physical Exam: General: Chronically-ill appearing, no acute distress HENT: [place], AT, OP clear, MMM Eyes: EOMI, no scleral icterus Respiratory: Anterior rhonchi bilaterally Cardiovascular: RRR, -M/R/G, no JVD GI: General abdominal tenderness, midline wound vac in place, JP drain with minimal blood-tinged fluid Extremities:-Edema,-tenderness Neuro: AAO x2, CNII-XII grossly intact GU: Foley in place with dark urine  CXR 01/10/21 - Left basilar atelectasis. Left subclavian line. Interval removal of ETT  WBC improving 20 BUN/Cr improving  Bcx 10/9 +Bacteroides Abdominal Cx 10/9 Pan-sensitive  E.coli and enterococcus Final  TTE with EF 45-50%, global hypokinesis  Assessment & Plan:   Severe sepsis/septic shock secondary to intra-abdominal infection (E.colic and enteroccus) in setting of ruptured appendix and bacteroides  species bacteremia Presented with septic shock in setting ruptured appendix course c/b hemorrhagic shock from spontaneous splenic rupture 10/14 with deterioration post splenectomy either from vasoplegia related to hemorrhagic shock or possible intraabdominal infection. Worsening leukocytosis however shock has resolved - Off pressors 10/16 - Continue Zosyn to include enterococcus coverage - F/u blood cultures for clearance - Tylenol for fever  Acute respiratory failure pleural effusions, atelectasis Self extubated 10/16. 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 TPN until bowel function returns.  - Zosyn  Hypertension - Increase coreg 12.5 - Hold on losartan  AKI on CKD IIIa - improving - Follow-up UOP/Cr - Avoid nephrotoxic agents  Hx of Type 2 DM. - SSI  Hx of nonischemic cardiomyopathy, non obstructive CAD, HTN. - Continue to hold home meds  Acute metabolic encephalopathy - improving - Re-orient - Management as noted above  Acute on chronic anemia Acute blood loss anemia 2/2 splenic rupture Hg 12>8 on 10/14 - Trend CBC - Transfuse for Hg >7  Moderate malnutrition - TPN   Best Practice (right click and "Reselect all SmartList Selections" daily)   Diet/type: NPO and TPN DVT prophylaxis: prophylactic heparin  GI prophylaxis: H2B Lines: yes and it is still needed Foley:  Yes, and it is still needed Code Status:  DNR Last date of multidisciplinary goals of care discussion: with niece [name] 10/14, 10/15. 10/16 patient self extubated and confirmed DNR however wishes to continue current care  CC time: 40 min.   The patient is critically ill with multiple organ systems  failure and requires high complexity decision making for assessment and support, frequent evaluation and titration of therapies, application of advanced monitoring technologies and extensive interpretation of multiple databases.    Consider transfer to progressive  [name], M.D. [facility] Pulmonary/Critical Care Medicine 01/10/2021 10:23 AM   Please see Amion for pager number to reach on-call Pulmonary and Critical Care Team.

## 2021-01-11 DIAGNOSIS — K3532 Acute appendicitis with perforation and localized peritonitis, without abscess: Secondary | ICD-10-CM | POA: Diagnosis not present

## 2021-01-11 LAB — CBC
HCT: 24.3 % — ABNORMAL LOW (ref 39.0–52.0)
Hemoglobin: 7.6 g/dL — ABNORMAL LOW (ref 13.0–17.0)
MCH: 30.3 pg (ref 26.0–34.0)
MCHC: 31.3 g/dL (ref 30.0–36.0)
MCV: 96.8 fL (ref 80.0–100.0)
Platelets: 391 10*3/uL (ref 150–400)
RBC: 2.51 MIL/uL — ABNORMAL LOW (ref 4.22–5.81)
RDW: 16.8 % — ABNORMAL HIGH (ref 11.5–15.5)
WBC: 21.1 10*3/uL — ABNORMAL HIGH (ref 4.0–10.5)
nRBC: 0.2 % (ref 0.0–0.2)

## 2021-01-11 LAB — GLUCOSE, CAPILLARY
Glucose-Capillary: 162 mg/dL — ABNORMAL HIGH (ref 70–99)
Glucose-Capillary: 177 mg/dL — ABNORMAL HIGH (ref 70–99)
Glucose-Capillary: 188 mg/dL — ABNORMAL HIGH (ref 70–99)
Glucose-Capillary: 206 mg/dL — ABNORMAL HIGH (ref 70–99)
Glucose-Capillary: 206 mg/dL — ABNORMAL HIGH (ref 70–99)

## 2021-01-11 LAB — BASIC METABOLIC PANEL
Anion gap: 6 (ref 5–15)
BUN: 20 mg/dL (ref 8–23)
CO2: 23 mmol/L (ref 22–32)
Calcium: 8.6 mg/dL — ABNORMAL LOW (ref 8.9–10.3)
Chloride: 107 mmol/L (ref 98–111)
Creatinine, Ser: 0.98 mg/dL (ref 0.61–1.24)
GFR, Estimated: >60 mL/min (ref >60)
Glucose, Bld: 188 mg/dL — ABNORMAL HIGH (ref 70–99)
Potassium: 3.7 mmol/L (ref 3.5–5.1)
Sodium: 136 mmol/L (ref 135–145)

## 2021-01-11 LAB — PHOSPHORUS: Phosphorus: 2.9 mg/dL (ref 2.5–4.6)

## 2021-01-11 LAB — MAGNESIUM: Magnesium: 1.9 mg/dL (ref 1.7–2.4)

## 2021-01-11 LAB — TRIGLYCERIDES: Triglycerides: 203 mg/dL — ABNORMAL HIGH (ref <150)

## 2021-01-11 MED ORDER — TRAVASOL 10 % IV SOLN
INTRAVENOUS | Status: AC
  Filled 2021-01-11: qty 1296

## 2021-01-11 MED ORDER — HYDROMORPHONE HCL 1 MG/ML IJ SOLN
1.0000 mg | INTRAMUSCULAR | Status: DC | PRN
  Administered 2021-01-11 (×2): 1 mg via INTRAVENOUS
  Filled 2021-01-11 (×2): qty 1

## 2021-01-11 MED ORDER — FUROSEMIDE 10 MG/ML IJ SOLN
40.0000 mg | Freq: Once | INTRAMUSCULAR | Status: AC
  Administered 2021-01-11: 40 mg via INTRAVENOUS
  Filled 2021-01-11: qty 4

## 2021-01-11 MED ORDER — HYDROMORPHONE HCL 1 MG/ML IJ SOLN
1.0000 mg | INTRAMUSCULAR | Status: DC | PRN
  Administered 2021-01-11 – 2021-01-12 (×5): 1 mg via INTRAVENOUS
  Filled 2021-01-11 (×5): qty 1

## 2021-01-11 MED ORDER — OXYCODONE HCL 5 MG PO TABS
10.0000 mg | ORAL_TABLET | Freq: Four times a day (QID) | ORAL | Status: DC | PRN
  Administered 2021-01-11 (×2): 10 mg
  Filled 2021-01-11 (×2): qty 2

## 2021-01-11 NOTE — Progress Notes (Signed)
PHARMACY - TOTAL PARENTERAL NUTRITION CONSULT NOTE   Indication: Prolonged ileus  Patient Measurements: Height: 6' (182.9 cm) Weight: 92.7 kg (204 lb 5.9 oz) IBW/kg (Calculated) : 77.6   Body mass index is 27.72 kg/m.  Assessment: 22 yom presenting 10/9 with acute perforated appendix, and abdominal abscess s/p OR for ex-lap, ilececectomy, and drainage of abscess on 10/9. Also had splenic laceration, s/p ex-lap, splenectomy, and wound vac application [id number]. Transferred to ICU on 10/12 with septic vs. hemorrhagic shock, intubated and on vasopressor support. Plan was initially to transition to comfort measures 10/14; however improved overnight with decreasing vasopressor requirement. Post-op ileus. Pharmacy consulted to start TPN.  Noted - BM documented since TPN consult placed (01/08/21). Per discussion with Surgery, will be at least 48 hrs until tube feeds started and likely longer until at goal. TPN will still be started today with patient already at least 8 days without nutrition. Current diet order is NPO   Glucose / Insulin: hx DM2 (no meds documented PTA). CBGs controlled 130-150s prior to TPN start. CBGs 142-214 since TPN rate increase with two CBGs of 214. Utilized 28 units SSI in last 24hrs Electrolytes: Na 136, K 3.7 (goal >/=4 with ileus), Cl 107, CoCa 10.6 (Ca removed on 10/17), Phos 2.9 - decrease from 3.1, Mg 1.9 - decrease from 2.9 Renal: AKI on CKD improving and now close to baseline. Scr 0.98. BUN 20- decrease.  Hepatic: LFTs wnl. TG 203 - decreased from 10/17 but increased from 138 on 10/10. Tbili down to 1.4, albumin 1.5 Intake / Output; MIVF: UOP 0.82 ml/kg/hr, NGT output 0 ml - NGT clamped today. JP drain output 30 ml; LBM 10/16 GI Imaging: none since TPN start GI Surgeries / Procedures: none since TPN start  Central access: CVC triple lumen 10/14 TPN start date: 01/08/21  Nutritional Goals: Goal TPN rate is 100 mL/hr (provides 130 g of protein and 2421 kcals per  day)  RD Assessment: Estimated Needs Total Energy Estimated Needs: 2400-2600 Total Protein Estimated Needs: 125-150 grams Total Fluid Estimated Needs: > 2 L  Current Nutrition:  NPO  Plan:  TPN increased to goal rate of 100 ml/hr at 1800 based on updated RD recommendations on 10/17 Electrolytes in TPN: Na 15 meq, add back Mg at 3 mEq/L. K 50 mEq/L, continue Ca 0 mEq/L, Phos 15 mmol/L. Adjust Cl:Ac to max acetate.  Add standard MVI and trace elements to TPN + folic acid and thiamine with hx ETOH abuse Continue resistant SSI q4h and add 25 units of insulin to TPN Monitor TPN labs on Mon/Thurs, repeat electrolytes tomorrow Repeat TG tomorrow morning and continue to monitor closely for need to give lipids on MWF F/u return of bowel function, ability to initiate tube feeds and wean off TPN  Jenavie Stanczak BS, PharmD, BCPS Clinical Pharmacist 01/11/2021 9:09 AM

## 2021-01-11 NOTE — TOC Progression Note (Signed)
Transition of Care (TOC) - Progression Note  Patient recently transferred to 5W. Patients niece Danelle Earthly Peeler clarified that she has already spoken with staff member about [facility] referral. TOC will continue to follow.  Patient Details  Name: Travis [name] MRN: [id number] Date of Birth: July 16, 1955  Transition of Care [facility]) CM/SW Contact  Josey Forcier D Gilford Rile, [place] Work Phone Number: 01/11/2021, 4:35 PM  Clinical Narrative:            Expected Discharge Plan and Services                                                 Social Determinants of Health (SDOH) Interventions    Readmission Risk Interventions No flowsheet data found.

## 2021-01-11 NOTE — Evaluation (Signed)
Occupational Therapy Evaluation Patient Details Name: Travis [name] MRN: [id number] DOB: 10/24/1955 Today's Date: 01/11/2021   History of Present Illness Pt is a 65 y/o male admitted 10/9 with complaints of R lower quadrant abdominal pain. Found with intra-abdominal sepsis with perforated appendix and abdominal abscess; s/p diagnostic laparoscopy, ileocecectomy, drainage of interaabdominal abscess 10/9.  Post op ileus. 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 Pt self extubated 10/16. PMHx: CHF, CKD, COPD, TIA, diabetes, HTN, gout, CAD.   Clinical Impression   PTA patient independnet. Admitted for above and limited by problem list below, including pain, decreased activity tolerance, generalized weakness, and impaired cognition.  Patient oriented to self, unable to state specific date (which is his birthday) but voiced Oct 2022, reports at [facility] long hospital. He requires cueing to attend and problem solve throughout session.  Engaged in chair position ADLs due to pain and requiring +2 for mobility, requires mod assist for grooming and up to total assist for LB Adls.  He will benefit from continued OT services while admitted and after dc at SNF level to optimize return to PLOF with ADLs and mobility. Will follow acutely.      Recommendations for follow up therapy are one component of a multi-disciplinary discharge planning process, led by the attending physician.  Recommendations may be updated based on patient status, additional functional criteria and insurance authorization.   Follow Up Recommendations  SNF;Supervision/Assistance - 24 hour    Equipment Recommendations  Other (comment) (TBD)    Recommendations for Other Services PT consult     Precautions / Restrictions Precautions Precautions: Fall Precaution Comments: abdominal incision, JP drain R, NG tube, wound VAC Restrictions Weight Bearing Restrictions: No      Mobility Bed Mobility Overal bed mobility: Needs  Assistance             General bed mobility comments: repositioned into chair position, pt tolerating approx 50* before asking to lay back.    Transfers                 General transfer comment: deferred    Balance                                           ADL either performed or assessed with clinical judgement   ADL Overall ADL's : Needs assistance/impaired     Grooming: Moderate assistance;Bed level Grooming Details (indicate cue type and reason): chair position in bed, requires mod assist to wash hands; support under R elbow to bring hand to wipe eyes Upper Body Bathing: Maximal assistance;Bed level   Lower Body Bathing: Total assistance;Bed level   Upper Body Dressing : Maximal assistance;Bed level   Lower Body Dressing: Total assistance;Bed level     Toilet Transfer Details (indicate cue type and reason): deferred           General ADL Comments: pt limited by pain, weakness, decreased activity tolerance     Vision   Vision Assessment?: No apparent visual deficits     Perception     Praxis      Pertinent Vitals/Pain Pain Assessment: Faces Faces Pain Scale: Hurts even more Pain Location: stomach Pain Descriptors / Indicators: Discomfort;Moaning;Guarding;Operative site guarding Pain Intervention(s): Limited activity within patient's tolerance;Monitored during session;Premedicated before session;Repositioned     Hand Dominance Right   Extremity/Trunk Assessment Upper Extremity Assessment Upper Extremity Assessment: Generalized weakness  Lower Extremity Assessment Lower Extremity Assessment: Defer to PT evaluation       Communication Communication Communication: No difficulties   Cognition Arousal/Alertness: Lethargic Behavior During Therapy: Flat affect Overall Cognitive Status: Impaired/Different from baseline Area of Impairment: Orientation;Safety/judgement;Attention;Memory;Following commands;Problem  solving;Awareness                 Orientation Level: Disoriented to;Place Current Attention Level: Focused Memory: Decreased recall of precautions;Decreased short-term memory Following Commands: Follows one step commands inconsistently;Follows one step commands with increased time Safety/Judgement: Decreased awareness of safety;Decreased awareness of deficits Awareness: Emergent Problem Solving: Slow processing;Decreased initiation;Difficulty sequencing;Requires verbal cues;Requires tactile cues General Comments: pt oriented to self but not exact date (his birthday), able to voice hospital but voices [place]. able to recall [facility] at end of session.  Pt requires constant cueing for attention to task, poor awareness and problem sovling.   General Comments  VSS on 4L [place]    Exercises Exercises: General Upper Extremity General Exercises - Upper Extremity Shoulder Flexion: AAROM;5 reps;Both;Supine   Shoulder Instructions      Home Living Family/patient expects to be discharged to:: Private residence Living Arrangements: Alone Available Help at Discharge: Family;Available PRN/intermittently Type of Home: Apartment Home Access: Stairs to enter Entrance Stairs-Number of Steps: 2 Entrance Stairs-Rails: Right Home Layout: One level     Bathroom Shower/Tub: Teacher, early years/pre: Standard     Home Equipment: Cane - single point;Grab bars - toilet          Prior Functioning/Environment Level of Independence: Independent        Comments: used cane intermittently as needed, independent ADLs, IADLs, and driving  but not working        OT Problem List: Decreased strength;Decreased activity tolerance;Impaired balance (sitting and/or standing);Decreased safety awareness;Decreased knowledge of use of DME or AE;Decreased knowledge of precautions;Pain;Decreased cognition;Cardiopulmonary status limiting activity      OT Treatment/Interventions: Self-care/ADL  training;Therapeutic exercise;DME and/or AE instruction;Therapeutic activities;Patient/family education;Balance training;Energy conservation;Cognitive remediation/compensation    OT Goals(Current goals can be found in the care plan section) Acute Rehab OT Goals Patient Stated Goal: to get better OT Goal Formulation: With patient Time For Goal Achievement: 01/25/21 Potential to Achieve Goals: Good  OT Frequency: Min 2X/week   Barriers to D/C:            Co-evaluation              AM-PAC OT "6 Clicks" Daily Activity     Outcome Measure Help from another person eating meals?: Total Help from another person taking care of personal grooming?: A Lot Help from another person toileting, which includes using toliet, bedpan, or urinal?: Total Help from another person bathing (including washing, rinsing, drying)?: A Lot Help from another person to put on and taking off regular upper body clothing?: A Lot Help from another person to put on and taking off regular lower body clothing?: Total 6 Click Score: 9   End of Session Equipment Utilized During Treatment: Oxygen (4L) Nurse Communication: Mobility status  Activity Tolerance: Patient limited by pain;Patient limited by lethargy Patient left: in bed;with call bell/phone within reach;with bed alarm set  OT Visit Diagnosis: Other abnormalities of gait and mobility (R26.89);Muscle weakness (generalized) (M62.81);Pain;Other symptoms and signs involving cognitive function Pain - part of body:  (abdomen)                Time: [id number] OT Time Calculation (min): 23 min Charges:  OT General Charges $OT Visit: 1 Visit OT  Evaluation $OT Eval Moderate Complexity: 1 Mod OT Treatments $Self Care/Home Management : 8-22 mins  Jolaine Artist, OT Acute Rehabilitation Services Pager [phone] Office [phone]   Delight Stare 01/11/2021, 12:17 PM

## 2021-01-11 NOTE — Consult Note (Signed)
[facility] Nurse wound follow up Patient receiving care in [place], PA-C present for vac change.  Wound type: Midline abdominal surgical incision Pressure Injury POA: NA Measurements: 21 cm x 3.5 cm x 3 cm with false bottom.  Wound bed: pink granulation tissue. Friable. Bleeding when dressing removed.  Drainage (amount, consistency, odor) Bloody Periwound: Intact Dressing procedure/placement/frequency: 2 pieces of black foam removed. 2 pieces of black foam used to fill the wound bed which extends down to the pubic hair line. Medium size Kellie Simmering # [phone]) Drape applied and immediate suction obtained at 125 mmHg. Canister Kellie Simmering # [phone]). M/W/F vac change. WOC will follow.  PHOTO TAKEN BY MARTHA KABRICH, PA, NOT BY WOC TEAM.     Thank you for the consult. [place] [facility] nurse will not follow at this time.   Please re-consult the [place] team if needed.   Cathlean Marseilles [name], MSN, RN, [place], Lysle Pearl, [facility] Wound Treatment Associate Pager [phone]

## 2021-01-11 NOTE — Progress Notes (Signed)
NAME:  Travis [name], MRN:  [id number], DOB:  Mar 22, 1956, LOS: 39 ADMISSION DATE:  01/01/2021, CONSULTATION DATE:  01/01/2021 REFERRING MD:  Dr Travis [name], CHIEF COMPLAINT:  abdominal pain   History of Present Illness:  [name]. Travis [name] is a 65 year old gentleman with an extended PMH that includes but not limited to CHF, CKD 3A, COPD, CRA O, TIA, diabetes, hypertension, gout, hyperlipidemia, CAD (nonobstructive). She presented to the ED on 01/01/21 with complaints of right lower quadrant abdominal pain. He was found to be pyrexial and tachycardiac. Work up included a CT scan of the abdomen and pelvis which revealed:  "Circumferential mucosal thickening and indistinctness of the bowel wall at the base of the appendix and the distal cecum at the ileocecal junction. There is an associated milder mucosal thickening and indistinctness of the terminal ileum. The distal appendix is decompressed, contains gas and fecal matter and overall has normal appearance. Small amount of free fluid and free gas in the right lower quadrant of the abdomen. These findings are likely due to acute appendicitis with perforation. It is unclear however whether the mucosal thickening of the cecum is reactive or is the source of appendiceal obstruction. Underlying malignancy such as mucinous tumor is in the differential diagnosis. 2. Small foci of free intraperitoneal gas and small amount of fluid scattered in the mesentery of the right lower and upper quadrants of the abdomen, and in perihepatic location."  Patient was therefore taken to the operating room where he was found to have a perforated appendix and abdominal abscess. He is S/P DIAGNOSTIC LAPAROSCOPY; EXPLORATORY LAPAROTOMY, ILECECECTOMY, DRAINAGE OF INTRA-ABDOMINAL ABSCESS  The patient is now in the ICU and on mechanical ventilation.  He arrived on Neosynephrine drip for vasopressor support and on IVF.  I have seen and examined the patient.  Pertinent   Medical History  CKD 3A COPD TIA DM  Significant Hospital Events: Including procedures, antibiotic start and stop dates in addition to other pertinent events   10/9: ex lap, remained intubated 10/10: extubated 10/12 transferred to [facility] 10/14 0130 hypotensive with worsening abd pain, surgery called and asked for repeat CT scan.  Started on Levophed.  Back to PCCM service. 6U pRBC total, 4U FFP total. To OR for splenectomy. 10/16 Weaned off pressors. Self-extubated  10/18 Increased O2 requirement to 8L 10/19 O2 weaned to 4L  Interim History / Subjective:  Low-grade fevers on scheduled tylenol Weaned to 4L O2 Continues to complain of abdominal pain. Unable to sleep  Objective   Blood pressure 131/77, pulse 92, temperature 99.5 F (37.5 C), resp. rate 17, height 6' (1.829 m), weight 92.7 kg, SpO2 100 %.        Intake/Output Summary (Last 24 hours) at 01/11/2021 0957 Last data filed at 01/11/2021 0800 Gross per 24 hour  Intake 2360.99 ml  Output 1740 ml  Net 620.99 ml   Filed Weights   01/08/21 0356 01/10/21 0500 01/11/21 0500  Weight: 91.2 kg 91 kg 92.7 kg   Physical Exam: General: Chronically ill-appearing, no acute distress HENT: [place], AT, OP clear, MMM Eyes: EOMI, no scleral icterus Respiratory: Clear to auscultation bilaterally.  No crackles, wheezing or rales Cardiovascular: RRR, -M/R/G, no JVD GI: Midline wound vac in place, JP drain in place Extremities:-Edema,-tenderness Neuro: AAO x3, CNII-XII grossly intact GU: Foley in place  CXR 01/10/21 - Left basilar atelectasis. Left subclavian line. Interval removal of ETT  WBC stable 20s AKI resolved  Bcx 10/9 +Bacteroides Bcx 10/17 NGTD x  2 Abdominal Cx 10/9 Pan-sensitive E.coli and enterococcus Final  TTE with EF 45-50%, global hypokinesis  Assessment & Plan:   Severe sepsis/septic shock secondary to intra-abdominal infection (E.colic and enteroccus) in setting of ruptured appendix and bacteroides species  bacteremia Presented with septic shock in setting ruptured appendix course c/b hemorrhagic shock from spontaneous splenic rupture 10/14 with deterioration post splenectomy either from vasoplegia related to hemorrhagic shock or possible intraabdominal infection. Worsening leukocytosis however shock has resolved - Off pressors 10/16 - Continue Zosyn to include enterococcus coverage - F/u blood cultures for clearance. NGTD - Tylenol for fever  Acute respiratory failure pleural effusions, atelectasis - weaning Self extubated 10/16. 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 TPN until bowel function returns.  - Zosyn  Hypertension - Increase coreg 12.5 - Hold on losartan - Diurese. Consider starting PO lasix  AKI on CKD IIIa - resolved - Follow-up UOP/Cr - Avoid nephrotoxic agents - Diuresis as above  Hx of Type 2 DM. - SSI  Hx of nonischemic cardiomyopathy, non obstructive CAD, HTN. - Continue to hold home meds  Acute metabolic encephalopathy - improved - Re-orient - Management as noted above  Acute on chronic anemia Acute blood loss anemia 2/2 splenic rupture Hg 12>8 on 10/14 - Trend CBC - Transfuse for Hg >7  Moderate malnutrition - TPN   Best Practice (right click and "Reselect all SmartList Selections" daily)   Diet/type: NPO and TPN DVT prophylaxis: prophylactic heparin  GI prophylaxis: H2B Lines: yes and it is still needed Foley:  Yes, and it is still needed Code Status:  DNR Last date of multidisciplinary goals of care discussion: with niece [name] 10/14, 10/15. 10/16 patient self extubated and confirmed DNR however wishes to continue  current care  Care Time: 15 min  [name], M.D. [facility] Pulmonary/Critical Care Medicine 01/11/2021 9:58 AM   See Amion for personal pager For hours between 7 PM to 7 AM, please call Elink for urgent questions

## 2021-01-11 NOTE — Plan of Care (Signed)
  Problem: Activity: Goal: Ability to tolerate increased activity will improve Outcome: Progressing   Problem: Respiratory: Goal: Ability to maintain a clear airway and adequate ventilation will improve Outcome: Progressing   

## 2021-01-11 NOTE — Progress Notes (Signed)
Progress Note   Subjective: Per patient and overnight RN patient has had ongoing abdominal pain overnight subjectively worse than yesterday. No nausea or emesis. Passing flatus and last BM several days ago. Getting vac changed this am and significant discomfort with this  Objective: Vital signs in last 24 hours: Temp:  [99.1 F (37.3 C)-100.8 F (38.2 C)] 99.5 F (37.5 C) (10/19 0133) Pulse Rate:  [94-152] 103 (10/19 0133) Resp:  [20-34] 23 (10/19 0133) BP: (123-158)/(66-88) 145/73 (10/18 2300) SpO2:  [89 %-100 %] 99 % (10/19 0133) Weight:  [92.7 kg] 92.7 kg (10/19 0500) Last BM Date: 01/08/21  Intake/Output from previous day: 10/18 0701 - 10/19 0700 In: 2356.2 [I.V.:1654.2; IV Piggyback:702] Out: 1855 [Urine:1825; Drains:30] Intake/Output this shift: No intake/output data recorded.  PE: Abd: soft, mildly distended. appropriately TTP diffusely, normal BS, NGT clamped - reconnected to suction during exam with minimal output (bile tinged spit) and NGT reclamped Wound as below. JP with SS output Psych: oriented to year and situation      Lab Results:  Recent Labs    01/09/21 0332 01/10/21 0219  WBC 22.7* 20.5*  HGB 8.4* 8.1*  HCT 25.2* 25.2*  PLT 297 344    BMET Recent Labs    01/10/21 0219 01/11/21 0419  NA 146* 136  K 3.8 3.7  CL 114* 107  CO2 22 23  GLUCOSE 182* 188*  BUN 26* 20  CREATININE 1.29* 0.98  CALCIUM 8.7* 8.6*    PT/INR No results for input(s): LABPROT, INR in the last 72 hours.  CMP     Component Value Date/Time   NA 136 01/11/2021 0419   NA 138 06/20/2020 1034   NA 141 04/20/2014 1019   K 3.7 01/11/2021 0419   K 3.7 04/20/2014 1019   CL 107 01/11/2021 0419   CL 106 04/20/2014 1019   CO2 23 01/11/2021 0419   CO2 30 04/20/2014 1019   GLUCOSE 188 (H) 01/11/2021 0419   GLUCOSE 102 (H) 04/20/2014 1019   BUN 20 01/11/2021 0419   BUN 18 06/20/2020 1034   BUN 14 04/20/2014 1019   CREATININE 0.98 01/11/2021 0419   CREATININE  1.18 04/20/2014 1019   CALCIUM 8.6 (L) 01/11/2021 0419   CALCIUM 9.3 04/20/2014 1019   PROT 5.4 (L) 01/10/2021 0219   PROT 6.5 06/18/2017 1425   PROT 7.4 04/20/2014 1019   ALBUMIN 1.5 (L) 01/10/2021 0219   ALBUMIN 4.0 06/18/2017 1425   ALBUMIN 3.4 04/20/2014 1019   AST 21 01/10/2021 0219   AST 21 04/20/2014 1019   ALT 17 01/10/2021 0219   ALT 20 04/20/2014 1019   ALKPHOS 116 01/10/2021 0219   ALKPHOS 97 04/20/2014 1019   BILITOT 1.4 (H) 01/10/2021 0219   BILITOT 0.4 06/18/2017 1425   BILITOT 0.6 04/20/2014 1019   GFRNONAA >60 01/11/2021 0419   GFRNONAA >60 04/20/2014 1019   GFRNONAA >60 11/19/2012 1107   GFRAA 53 (L) 01/13/2020 1410   GFRAA >60 04/20/2014 1019   GFRAA >60 11/19/2012 1107   Lipase     Component Value Date/Time   LIPASE 31 01/01/2021 1023   LIPASE 112 04/20/2014 1019       Studies/Results: DG CHEST PORT 1 VIEW  Result Date: 01/10/2021 CLINICAL DATA:  Hypoxemia EXAM: PORTABLE CHEST 1 VIEW COMPARISON:  October sixteenth, 2022. FINDINGS: Evaluation is limited secondary to rotation. The cardiomediastinal silhouette is unchanged in contour.Interval extubation. The enteric tube courses through the chest to the abdomen beyond the field-of-view. Revisualization of the  LEFT subclavian approach central catheter with tip terminating over the region of the confluence of the LEFT brachiocephalic vein and SVC. Low lung volumes. Decreased conspicuity of bilateral small pleural effusions. No pneumothorax. LEFT retrocardiac homogeneous opacity most consistent with atelectasis. Visualized abdomen is unremarkable. Status post RIGHT shoulder arthroplasty. IMPRESSION: Interval extubation. LEFT basilar homogeneous opacity, likely atelectasis. Electronically Signed   By: Valentino Saxon M.D.   On: 01/10/2021 08:20   DG Abd Portable 1V  Result Date: 01/10/2021 CLINICAL DATA:  NG tube placement EXAM: PORTABLE ABDOMEN - 1 VIEW COMPARISON:  KUB 01/06/2021 FINDINGS: The enteric  catheter tip and side hole project over the stomach. There is a nonobstructive bowel gas pattern. Enteric contrast is seen in the right colon, partially imaged. The lungs are better assessed on the same day chest radiograph. IMPRESSION: NG tube tip and side hole in the stomach. Electronically Signed   By: Valetta Mole M.D.   On: 01/10/2021 16:39    Anti-infectives: Anti-infectives (From admission, onward)    Start     Dose/Rate Route Frequency Ordered Stop   01/09/21 1100  piperacillin-tazobactam (ZOSYN) IVPB 3.375 g        3.375 g 12.5 mL/hr over 240 Minutes Intravenous Every 8 hours 01/09/21 1000     01/09/21 0830  ceFEPIme (MAXIPIME) 2 g in sodium chloride 0.9 % 100 mL IVPB  Status:  Discontinued        2 g 200 mL/hr over 30 Minutes Intravenous Every 12 hours 01/09/21 0737 01/09/21 1000   01/08/21 1400  metroNIDAZOLE (FLAGYL) IVPB 500 mg  Status:  Discontinued        500 mg 100 mL/hr over 60 Minutes Intravenous Every 12 hours 01/08/21 1059 01/09/21 1000   01/08/21 1400  ceFEPIme (MAXIPIME) 2 g in sodium chloride 0.9 % 100 mL IVPB  Status:  Discontinued        2 g 200 mL/hr over 30 Minutes Intravenous Every 24 hours 01/08/21 1114 01/09/21 0737   01/08/21 1000  vancomycin (VANCOREADY) IVPB 750 mg/150 mL  Status:  Discontinued        750 mg 150 mL/hr over 60 Minutes Intravenous Every 24 hours 01/07/21 2052 01/09/21 1000   01/08/21 0600  vancomycin (VANCOREADY) IVPB 1500 mg/300 mL  Status:  Discontinued        1,500 mg 150 mL/hr over 120 Minutes Intravenous Every 24 hours 01/07/21 0430 01/07/21 1210   01/08/21 0600  vancomycin (VANCOREADY) IVPB 750 mg/150 mL  Status:  Discontinued        750 mg 150 mL/hr over 60 Minutes Intravenous Every 24 hours 01/07/21 1210 01/07/21 2052   01/07/21 1400  piperacillin-tazobactam (ZOSYN) IVPB 3.375 g  Status:  Discontinued        3.375 g 12.5 mL/hr over 240 Minutes Intravenous Every 8 hours 01/07/21 0430 01/08/21 1114   01/07/21 0530   piperacillin-tazobactam (ZOSYN) IVPB 3.375 g        3.375 g 100 mL/hr over 30 Minutes Intravenous STAT 01/07/21 0430 01/07/21 0622   01/07/21 0530  vancomycin (VANCOREADY) IVPB 1750 mg/350 mL        1,750 mg 175 mL/hr over 120 Minutes Intravenous  Once 01/07/21 0430 01/07/21 0841   01/05/21 1430  Ampicillin-Sulbactam (UNASYN) 3 g in sodium chloride 0.9 % 100 mL IVPB        3 g 200 mL/hr over 30 Minutes Intravenous Every 6 hours 01/05/21 1334 01/06/21 1552   01/01/21 2200  piperacillin-tazobactam (ZOSYN) IVPB 3.375 g  3.375 g 12.5 mL/hr over 240 Minutes Intravenous Every 8 hours 01/01/21 1841 01/04/21 2359   01/01/21 2100  piperacillin-tazobactam (ZOSYN) IVPB 3.375 g  Status:  Discontinued        3.375 g 100 mL/hr over 30 Minutes Intravenous Every 8 hours 01/01/21 1836 01/01/21 1841   01/01/21 1345  piperacillin-tazobactam (ZOSYN) IVPB 3.375 g        3.375 g 100 mL/hr over 30 Minutes Intravenous  Once 01/01/21 1337 01/01/21 1430        Assessment/Plan Perforated appendicitis with abscess POD 10, s/p exploratory laparotomy, ileocecectomy, drainage of intraabdominal abscess 01/01/21 Dr. Grandville Silos - NGT with bilious drainage, post-op ileus, some bowel function returning.  NG clamped yesterday - minimal output when connected to suction this am. Continued abdominal pain. Keep clamped today with sips and chips - Cxs w/ pansensitive E.Coli, continue IV Zosyn  - Drain SS this AM, continue drain, but can likely DC soon after starting diet - VAC change M/W/F to midline - Recommend initiation of PT/OT for mobilization -adjusted pain meds for better pain control - Tmax 100.31F - monitor temp and WBC and consider repeat scan pending trend   Splenic laceration  POD 5 s/p ex lap, splenectomy, application of incisional wound vac 10/14. Dr. Bobbye Morton  - hgb stable around 8 yesterday - post-op ileus course as above -will need vaccines prior to discharge   FEN: TNA, clamp NGT, may have sips and  chips VTE: SQH  ID: Zosyn 10/9>> ? Duration, WBC up and pending today but may likely be secondary to splenectomy. Dispo: ICU, DNR    Below per primary team: hemorrhagic shock 2/2 above s/p 6 u PRBC, 2 u FFP 10/14 Septic shock 2/2 perforated appendicitis  EtOH abuse Alcohol related cardiomyopathy T2DM Chronic anemia Hx of TIA HTN HLD Gout Hx of GIB CAD Tobacco abuse   LOS: 10 days    Winferd Humphrey, [facility] Surgery 01/11/2021, 7:18 AM Please see Amion for pager number during day hours 7:00am-4:30pm

## 2021-01-12 ENCOUNTER — Ambulatory Visit: Payer: Medicare HMO | Admitting: Family

## 2021-01-12 ENCOUNTER — Telehealth: Payer: Medicare HMO | Admitting: Adult Health

## 2021-01-12 ENCOUNTER — Inpatient Hospital Stay (HOSPITAL_COMMUNITY): Payer: Medicare HMO

## 2021-01-12 DIAGNOSIS — K3532 Acute appendicitis with perforation and localized peritonitis, without abscess: Secondary | ICD-10-CM | POA: Diagnosis not present

## 2021-01-12 DIAGNOSIS — I5022 Chronic systolic (congestive) heart failure: Secondary | ICD-10-CM | POA: Diagnosis not present

## 2021-01-12 DIAGNOSIS — N1831 Chronic kidney disease, stage 3a: Secondary | ICD-10-CM | POA: Diagnosis not present

## 2021-01-12 DIAGNOSIS — J439 Emphysema, unspecified: Secondary | ICD-10-CM | POA: Diagnosis not present

## 2021-01-12 LAB — CBC
HCT: 25.1 % — ABNORMAL LOW (ref 39.0–52.0)
Hemoglobin: 7.9 g/dL — ABNORMAL LOW (ref 13.0–17.0)
MCH: 30.6 pg (ref 26.0–34.0)
MCHC: 31.5 g/dL (ref 30.0–36.0)
MCV: 97.3 fL (ref 80.0–100.0)
Platelets: 468 10*3/uL — ABNORMAL HIGH (ref 150–400)
RBC: 2.58 MIL/uL — ABNORMAL LOW (ref 4.22–5.81)
RDW: 16.4 % — ABNORMAL HIGH (ref 11.5–15.5)
WBC: 21.8 10*3/uL — ABNORMAL HIGH (ref 4.0–10.5)
nRBC: 0.2 % (ref 0.0–0.2)

## 2021-01-12 LAB — COMPREHENSIVE METABOLIC PANEL
ALT: 16 U/L (ref 0–44)
AST: 20 U/L (ref 15–41)
Albumin: 1.5 g/dL — ABNORMAL LOW (ref 3.5–5.0)
Alkaline Phosphatase: 140 U/L — ABNORMAL HIGH (ref 38–126)
Anion gap: 10 (ref 5–15)
BUN: 23 mg/dL (ref 8–23)
CO2: 26 mmol/L (ref 22–32)
Calcium: 8.9 mg/dL (ref 8.9–10.3)
Chloride: 105 mmol/L (ref 98–111)
Creatinine, Ser: 1.09 mg/dL (ref 0.61–1.24)
GFR, Estimated: >60 mL/min (ref >60)
Glucose, Bld: 191 mg/dL — ABNORMAL HIGH (ref 70–99)
Potassium: 4.2 mmol/L (ref 3.5–5.1)
Sodium: 141 mmol/L (ref 135–145)
Total Bilirubin: 0.8 mg/dL (ref 0.3–1.2)
Total Protein: 5.5 g/dL — ABNORMAL LOW (ref 6.5–8.1)

## 2021-01-12 LAB — GLUCOSE, CAPILLARY
Glucose-Capillary: 200 mg/dL — ABNORMAL HIGH (ref 70–99)
Glucose-Capillary: 172 mg/dL — ABNORMAL HIGH (ref 70–99)
Glucose-Capillary: 248 mg/dL — ABNORMAL HIGH (ref 70–99)
Glucose-Capillary: 235 mg/dL — ABNORMAL HIGH (ref 70–99)
Glucose-Capillary: 222 mg/dL — ABNORMAL HIGH (ref 70–99)
Glucose-Capillary: 271 mg/dL — ABNORMAL HIGH (ref 70–99)

## 2021-01-12 LAB — PHOSPHORUS: Phosphorus: 3.6 mg/dL (ref 2.5–4.6)

## 2021-01-12 LAB — MAGNESIUM: Magnesium: 1.7 mg/dL (ref 1.7–2.4)

## 2021-01-12 MED ORDER — MAGNESIUM SULFATE 2 GM/50ML IV SOLN
2.0000 g | Freq: Once | INTRAVENOUS | Status: AC
  Administered 2021-01-12: 2 g via INTRAVENOUS
  Filled 2021-01-12: qty 50

## 2021-01-12 MED ORDER — BOOST / RESOURCE BREEZE PO LIQD CUSTOM
1.0000 | Freq: Two times a day (BID) | ORAL | Status: DC
  Administered 2021-01-12 – 2021-01-16 (×7): 1 via ORAL

## 2021-01-12 MED ORDER — OXYCODONE HCL 5 MG PO TABS
5.0000 mg | ORAL_TABLET | ORAL | Status: DC | PRN
  Administered 2021-01-12 – 2021-01-14 (×10): 10 mg via ORAL
  Administered 2021-01-15 – 2021-01-17 (×2): 5 mg via ORAL
  Administered 2021-01-18 – 2021-01-19 (×3): 10 mg via ORAL
  Filled 2021-01-12 (×3): qty 2
  Filled 2021-01-12: qty 1
  Filled 2021-01-12 (×2): qty 2
  Filled 2021-01-12: qty 1
  Filled 2021-01-12 (×9): qty 2

## 2021-01-12 MED ORDER — PANTOPRAZOLE 2 MG/ML SUSPENSION
40.0000 mg | Freq: Every day | ORAL | Status: DC
  Administered 2021-01-13 – 2021-01-22 (×10): 40 mg via ORAL
  Filled 2021-01-12 (×10): qty 20

## 2021-01-12 MED ORDER — HYDROMORPHONE HCL 1 MG/ML IJ SOLN
0.5000 mg | INTRAMUSCULAR | Status: DC | PRN
  Administered 2021-01-12 – 2021-01-16 (×10): 1 mg via INTRAVENOUS
  Administered 2021-01-17: 0.5 mg via INTRAVENOUS
  Administered 2021-01-20: 1 mg via INTRAVENOUS
  Filled 2021-01-12 (×8): qty 1
  Filled 2021-01-12: qty 0.5
  Filled 2021-01-12 (×3): qty 1

## 2021-01-12 MED ORDER — METHOCARBAMOL 500 MG PO TABS: 500.0000 mg | ORAL_TABLET | Freq: Four times a day (QID) | ORAL | Status: DC | PRN

## 2021-01-12 MED ORDER — IOHEXOL 9 MG/ML PO SOLN
ORAL | Status: AC
  Administered 2021-01-12: 500 mL
  Filled 2021-01-12: qty 1000

## 2021-01-12 MED ORDER — FUROSEMIDE 40 MG PO TABS
40.0000 mg | ORAL_TABLET | Freq: Every day | ORAL | Status: DC
  Administered 2021-01-12: 40 mg via ORAL
  Filled 2021-01-12: qty 1

## 2021-01-12 MED ORDER — IOHEXOL 300 MG/ML  SOLN
100.0000 mL | Freq: Once | INTRAMUSCULAR | Status: AC | PRN
  Administered 2021-01-12: 100 mL via INTRAVENOUS

## 2021-01-12 MED ORDER — ACETAMINOPHEN 500 MG PO TABS
1000.0000 mg | ORAL_TABLET | Freq: Four times a day (QID) | ORAL | Status: DC
  Administered 2021-01-12 – 2021-02-09 (×103): 1000 mg via ORAL
  Filled 2021-01-12 (×110): qty 2

## 2021-01-12 MED ORDER — STERILE WATER FOR INJECTION IV SOLN
INTRAVENOUS | Status: AC
  Filled 2021-01-12: qty 1296

## 2021-01-12 NOTE — Progress Notes (Signed)
Progress Note   Subjective: No new complaints today.  Still with some flatus.  No BM.  No nausea with NGT clamped.  Moved to progressive floor today.  Objective: Vital signs in last 24 hours: Temp:  [98.3 F (36.8 C)-100.2 F (37.9 C)] 98.3 F (36.8 C) (10/20 0800) Pulse Rate:  [81-283] 112 (10/20 0800) Resp:  [17-26] 21 (10/20 0800) BP: (112-169)/(69-95) 122/77 (10/20 0800) SpO2:  [85 %-100 %] 99 % (10/20 0800) Weight:  [93.2 kg] 93.2 kg (10/20 0351) Last BM Date: 01/08/21  Intake/Output from previous day: 10/19 0701 - 10/20 0700 In: 710 [P.O.:50; I.V.:560; IV Piggyback:99.9] Out: 2750 [Urine:2750] Intake/Output this shift: No intake/output data recorded.  PE: Abd: soft, mildly distended. appropriately TTP, normal BS, NGT with no residual on return to suction, clamped back off. Wound as below yesterday with VAC change JP with SS output, but minimal     Lab Results:  Recent Labs    01/11/21 0744 01/12/21 0750  WBC 21.1* 21.8*  HGB 7.6* 7.9*  HCT 24.3* 25.1*  PLT 391 468*   BMET Recent Labs    01/11/21 0419 01/12/21 0416  NA 136 141  K 3.7 4.2  CL 107 105  CO2 23 26  GLUCOSE 188* 191*  BUN 20 23  CREATININE 0.98 1.09  CALCIUM 8.6* 8.9   PT/INR No results for input(s): LABPROT, INR in the last 72 hours.  CMP     Component Value Date/Time   NA 141 01/12/2021 0416   NA 138 06/20/2020 1034   NA 141 04/20/2014 1019   K 4.2 01/12/2021 0416   K 3.7 04/20/2014 1019   CL 105 01/12/2021 0416   CL 106 04/20/2014 1019   CO2 26 01/12/2021 0416   CO2 30 04/20/2014 1019   GLUCOSE 191 (H) 01/12/2021 0416   GLUCOSE 102 (H) 04/20/2014 1019   BUN 23 01/12/2021 0416   BUN 18 06/20/2020 1034   BUN 14 04/20/2014 1019   CREATININE 1.09 01/12/2021 0416   CREATININE 1.18 04/20/2014 1019   CALCIUM 8.9 01/12/2021 0416   CALCIUM 9.3 04/20/2014 1019   PROT 5.5 (L) 01/12/2021 0416   PROT 6.5 06/18/2017 1425   PROT 7.4 04/20/2014 1019   ALBUMIN 1.5 (L)  01/12/2021 0416   ALBUMIN 4.0 06/18/2017 1425   ALBUMIN 3.4 04/20/2014 1019   AST 20 01/12/2021 0416   AST 21 04/20/2014 1019   ALT 16 01/12/2021 0416   ALT 20 04/20/2014 1019   ALKPHOS 140 (H) 01/12/2021 0416   ALKPHOS 97 04/20/2014 1019   BILITOT 0.8 01/12/2021 0416   BILITOT 0.4 06/18/2017 1425   BILITOT 0.6 04/20/2014 1019   GFRNONAA >60 01/12/2021 0416   GFRNONAA >60 04/20/2014 1019   GFRNONAA >60 11/19/2012 1107   GFRAA 53 (L) 01/13/2020 1410   GFRAA >60 04/20/2014 1019   GFRAA >60 11/19/2012 1107   Lipase     Component Value Date/Time   LIPASE 31 01/01/2021 1023   LIPASE 112 04/20/2014 1019       Studies/Results: DG Abd Portable 1V  Result Date: 01/10/2021 CLINICAL DATA:  NG tube placement EXAM: PORTABLE ABDOMEN - 1 VIEW COMPARISON:  KUB 01/06/2021 FINDINGS: The enteric catheter tip and side hole project over the stomach. There is a nonobstructive bowel gas pattern. Enteric contrast is seen in the right colon, partially imaged. The lungs are better assessed on the same day chest radiograph. IMPRESSION: NG tube tip and side hole in the stomach. Electronically Signed   By:  Valetta Mole M.D.   On: 01/10/2021 16:39    Anti-infectives: Anti-infectives (From admission, onward)    Start     Dose/Rate Route Frequency Ordered Stop   01/09/21 1100  piperacillin-tazobactam (ZOSYN) IVPB 3.375 g        3.375 g 12.5 mL/hr over 240 Minutes Intravenous Every 8 hours 01/09/21 1000     01/09/21 0830  ceFEPIme (MAXIPIME) 2 g in sodium chloride 0.9 % 100 mL IVPB  Status:  Discontinued        2 g 200 mL/hr over 30 Minutes Intravenous Every 12 hours 01/09/21 0737 01/09/21 1000   01/08/21 1400  metroNIDAZOLE (FLAGYL) IVPB 500 mg  Status:  Discontinued        500 mg 100 mL/hr over 60 Minutes Intravenous Every 12 hours 01/08/21 1059 01/09/21 1000   01/08/21 1400  ceFEPIme (MAXIPIME) 2 g in sodium chloride 0.9 % 100 mL IVPB  Status:  Discontinued        2 g 200 mL/hr over 30 Minutes  Intravenous Every 24 hours 01/08/21 1114 01/09/21 0737   01/08/21 1000  vancomycin (VANCOREADY) IVPB 750 mg/150 mL  Status:  Discontinued        750 mg 150 mL/hr over 60 Minutes Intravenous Every 24 hours 01/07/21 2052 01/09/21 1000   01/08/21 0600  vancomycin (VANCOREADY) IVPB 1500 mg/300 mL  Status:  Discontinued        1,500 mg 150 mL/hr over 120 Minutes Intravenous Every 24 hours 01/07/21 0430 01/07/21 1210   01/08/21 0600  vancomycin (VANCOREADY) IVPB 750 mg/150 mL  Status:  Discontinued        750 mg 150 mL/hr over 60 Minutes Intravenous Every 24 hours 01/07/21 1210 01/07/21 2052   01/07/21 1400  piperacillin-tazobactam (ZOSYN) IVPB 3.375 g  Status:  Discontinued        3.375 g 12.5 mL/hr over 240 Minutes Intravenous Every 8 hours 01/07/21 0430 01/08/21 1114   01/07/21 0530  piperacillin-tazobactam (ZOSYN) IVPB 3.375 g        3.375 g 100 mL/hr over 30 Minutes Intravenous STAT 01/07/21 0430 01/07/21 0622   01/07/21 0530  vancomycin (VANCOREADY) IVPB 1750 mg/350 mL        1,750 mg 175 mL/hr over 120 Minutes Intravenous  Once 01/07/21 0430 01/07/21 0841   01/05/21 1430  Ampicillin-Sulbactam (UNASYN) 3 g in sodium chloride 0.9 % 100 mL IVPB        3 g 200 mL/hr over 30 Minutes Intravenous Every 6 hours 01/05/21 1334 01/06/21 1552   01/01/21 2200  piperacillin-tazobactam (ZOSYN) IVPB 3.375 g        3.375 g 12.5 mL/hr over 240 Minutes Intravenous Every 8 hours 01/01/21 1841 01/04/21 2359   01/01/21 2100  piperacillin-tazobactam (ZOSYN) IVPB 3.375 g  Status:  Discontinued        3.375 g 100 mL/hr over 30 Minutes Intravenous Every 8 hours 01/01/21 1836 01/01/21 1841   01/01/21 1345  piperacillin-tazobactam (ZOSYN) IVPB 3.375 g        3.375 g 100 mL/hr over 30 Minutes Intravenous  Once 01/01/21 1337 01/01/21 1430        Assessment/Plan POD 11, s/p exploratory laparotomy, ileocecectomy, drainage of intraabdominal abscess 01/01/21 Dr. Grandville Silos for perforated appendicitis with  abscess - NGT with no residual.  Will DC today and given CLD - Cxs w/ pansensitive E.Coli, continue IV Zosyn  - Drain SS this AM, continue drain, but can likely DC soon after starting diet - VAC change M/W/F to  midline - PT/OT rec SNF at discharge -adjusted pain meds for better pain control - Tmax 100.2.  WBC stable, likely elevated from splenectomy.  No overt signs of concerning process.  Will hold on CT scan at this time. -stop abx therapy today.    POD 6 s/p ex lap, splenectomy, application of incisional wound vac 10/14. Dr. Bobbye Morton for splenic laceration - hgb stable around 8-ish -will need vaccines prior to discharge   FEN: TNA, DC NGT, CLD VTE: SQH  ID: Zosyn 10/9>> 10/20 Dispo: progressive   Below per primary team: hemorrhagic shock 2/2 above s/p 6 u PRBC, 2 u FFP 10/14 Septic shock 2/2 perforated appendicitis  EtOH abuse Alcohol related cardiomyopathy T2DM Chronic anemia Hx of TIA HTN HLD Gout Hx of GIB CAD Tobacco abuse   LOS: 11 days    Henreitta Cea, [facility] Surgery 01/12/2021, 8:29 AM Please see Amion for pager number during day hours 7:00am-4:30pm

## 2021-01-12 NOTE — Consult Note (Signed)
WOC Nurse Consult Note: Patient transferred to 5W30 from 53M M/W/F vac change. This patient is being followed by the [facility] team. Plan to change vac dressing on Friday AM.  Cathlean Marseilles. [name], MSN, RN, [place], Lysle Pearl, [facility] Wound Treatment Associate Pager [phone]

## 2021-01-12 NOTE — Progress Notes (Signed)
Nutrition Follow-up  DOCUMENTATION CODES:   Non-severe (moderate) malnutrition in context of chronic illness  INTERVENTION:   -Boost Breeze po BID, each supplement provides 250 kcal and 9 grams of protein  -TPN management per pharmacy -RD will follow for diet advancement and adjust supplement regimen as appropriate  NUTRITION DIAGNOSIS:   Moderate Malnutrition related to acute illness, chronic illness (perforated appendix, CAD, cardiomyopathy) as evidenced by mild muscle depletion, mild fat depletion.  Ongoing  GOAL:   Patient will meet greater than or equal to 90% of their needs  Progressing   MONITOR:   Diet advancement, Labs, Weight trends, Skin, I & O's  REASON FOR ASSESSMENT:   Rounds    ASSESSMENT:   65 yo male admitted with perforated appendix and abdominal abscess. S/P ex lap, ileocecectomy, drainage of intra-abdominal abscess. PMH includes CHF, CKD 3A, COPD, TIA, DM, HTN, gout, HLD, CAD, alcohol abuse, tobacco abuse.  10/9- s/p ex lap, ileocectomy, drainage of intra-abdominal abscess 10/11- s/p ex lap, splenectomy, and incisional wound vac placement 10/16- extubated, TPN initiated 10/20- advanced to clear liquid diet  Reviewed I/O's: -2 L x 24 hours and +6.8 L since admission  UOP: 2.8 L x 24 hours  Per general surgery notes, plan to d/c NGT today, as pt had no nausea with clamping trials.  Pla n for CT to evaluate for drain able fluid collections or pleural effusions.   No meal completions currently available.   Pt remains on TPN for nutritional support- currently receiving @ 100 ml/hr, which provides 2421 kcals and 130 grams protein, which meets 100% of estimated kcals and protein needs.   Medications reviewed and include lasix  Labs reviewed: CBGS: [id number] (inpatient orders for glycemic control are 0-20 units insulin aspart every 4 hours).    Diet Order:   Diet Order             Diet clear liquid Room service appropriate? Yes; Fluid consistency:  Thin  Diet effective now                   EDUCATION NEEDS:   No education needs have been identified at this time  Skin:  Skin Assessment: Skin Integrity Issues: Skin Integrity Issues:: Wound VAC Wound Vac: abdomen  Last BM:  01/08/21  Height:   Ht Readings from Last 1 Encounters:  01/01/21 6' (1.829 m)    Weight:   Wt Readings from Last 1 Encounters:  01/12/21 93.2 kg   BMI:  Body mass index is 27.87 kg/m.  Estimated Nutritional Needs:   Kcal:  2400-2600  Protein:  125-150 grams  Fluid:  > 2 L    Loistine Chance, RD, LDN, [place] Registered Dietitian II Certified Diabetes Care and Education Specialist Please refer to [facility] for RD and/or RD on-call/weekend/after hours pager

## 2021-01-12 NOTE — Progress Notes (Signed)
PHARMACY - TOTAL PARENTERAL NUTRITION CONSULT NOTE   Indication: Prolonged ileus  Patient Measurements: Height: 6' (182.9 cm) Weight: 93.2 kg (205 lb 7.5 oz) IBW/kg (Calculated) : 77.6   Body mass index is 27.87 kg/m.  Assessment: 89 yom presenting 10/9 with acute perforated appendix, and abdominal abscess s/p OR for ex-lap, ilececectomy, and drainage of abscess on 10/9. Also had splenic laceration, s/p ex-lap, splenectomy, and wound vac application [id number]. Transferred to ICU on 10/12 with septic vs. hemorrhagic shock, intubated and on vasopressor support. Plan was initially to transition to comfort measures 10/14; however improved overnight with decreasing vasopressor requirement. Post-op ileus. Pharmacy consulted to start TPN.  Noted - BM documented since TPN consult placed (01/08/21). Per discussion with Surgery, will be at least 48 hrs until tube feeds started and likely longer until at goal. TPN will still be started today with patient already at least 8 days without nutrition. Current diet order is NPO   Glucose / Insulin: hx DM2 (no meds documented PTA). CBGs 120 to 130 prior to TPN start. CBGs 142-214 since TPN rate increase with two CBGs of 214. Utilized 30 units SSI in last 24hrs Electrolytes: Na 141, K 4.2 (goal >/=4 with ileus), Cl 105, CoCa 10.9 (Ca removed on 10/17), Phos 3.6 - increased from 2.9, Mg 1.7 - decrease from 2.9 Renal: AKI on CKD improving and now close to baseline. Scr 1.09. BUN 23- decrease.  Hepatic: LFTs wnl. TG 203 - decreased from 10/17 but increased from 138 on 10/10. Tbili down to 0.8, albumin 1.5 Intake / Output; MIVF: UOP 1.05 ml/kg/hr, NGT output not documented since 10/17  - NGT discontinued today. JP drain output not documented 10/19; LBM 10/16 GI Imaging: none since TPN start GI Surgeries / Procedures: none since TPN start  Central access: CVC triple lumen 10/14 TPN start date: 01/08/21  Nutritional Goals: Goal TPN rate is 100 mL/hr (provides 130 g  of protein and 2421 kcals per day)  RD Assessment: Estimated Needs Total Energy Estimated Needs: 2400-2600 Total Protein Estimated Needs: 125-150 grams Total Fluid Estimated Needs: > 2 L  Current Nutrition:  DC NGT, CLD  Plan:  TPN increased to goal rate of 100 ml/hr at 1800 based on updated RD recommendations on 10/17 Magnesium 2 grams iv x1 today Electrolytes in TPN: Na 15 meq, increased Mg to 5 mEq/L. K 50 mEq/L, continue Ca 0 mEq/L, Phos 15 mmol/L. Adjust Cl:Ac to max acetate.  Add standard MVI and trace elements to TPN + folic acid and thiamine with hx ETOH abuse Continue resistant iSS q4h and add 40 units of insulin to TPN Monitor TPN labs on Mon/Thurs, repeat electrolytes tomorrow Repeat TG tomorrow morning and continue to monitor closely for need to give lipids on MWF F/u return of bowel function, ability to initiate tube feeds and wean off TPN  Devell Parkerson BS, PharmD, BCPS Clinical Pharmacist 01/12/2021 7:02 AM

## 2021-01-12 NOTE — Progress Notes (Signed)
Pt having extreme pain, with tachypnea and slight fever.  Dr. Myna Hidalgo notified as well as Elmyra Ricks, RN.  Pt medicated per MAR.  Pt vitals are now improving, as well as pain improving and MEWS now at [facility]  Will continue to monitor closely.      01/12/21 1952  Assess: MEWS Score  Temp 99.4 F (37.4 C)  BP (!) 167/88  Pulse Rate (!) 114  ECG Heart Rate (!) 114  Resp (!) 27  Level of Consciousness Alert  SpO2 98 %  O2 Device Nasal Cannula  O2 Flow Rate (L/min) 4 L/min  Assess: MEWS Score  MEWS Temp 0  MEWS Systolic 0  MEWS Pulse 2  MEWS RR 2  MEWS LOC 0  MEWS Score 4  MEWS Score Color Red  Assess: if the MEWS score is Yellow or Red  Were vital signs taken at a resting state? Yes  Focused Assessment No change from prior assessment  Early Detection of Sepsis Score *See Row Information* Low  MEWS guidelines implemented *See Row Information* Yes  Treat  Pain Scale 0-10  Pain Score 9  Pain Type Acute pain  Pain Location Abdomen  Pain Orientation Mid  Pain Descriptors / Indicators Aching;Constant;Cramping;Discomfort;Nagging  Pain Frequency Other (Comment)  Pain Onset On-going  Patients Stated Pain Goal 5  Pain Intervention(s) Medication (See eMAR)

## 2021-01-12 NOTE — Progress Notes (Signed)
PROGRESS NOTE        PATIENT DETAILS Name: Travis [name] Age: 65 y.o. Sex: male Date of Birth: 07/20/1955 Admit Date: 01/01/2021 Admitting Physician Georganna Skeans, MD [id number], Alyson Locket, NP  Brief Narrative: Patient is a 65 y.o. male with history of HFrEF, CKD stage III, COPD, HTN, DM-2, HLD, nonobstructive CAD-who presented with abdominal pain-found to have sepsis from ruptured appendicitis-underwent exploratory laparotomy/ileocecectomy and drainage of intra-abdominal abscess.Patient was briefly managed in the ICU-unfortunately-on 10/14 he developed severe abdominal pain -he was then found to have hemorrhagic shock due to a spontaneous splenic rupture-he was reevaluated by general surgery and underwent a splenectomy.  He was readmitted to the ICU-remained on the ventilator-he self extubated on 10/16-he was monitored closely-upon further stability-transfer to the [facility] on 10/20.  Significant events: 10/09>>admit- ex lap, remained intubated post op-to ICU 10/10>>extubated 10/12>> transferred to [facility] 10/14>>hypotensive/abd pain-CT showed spleenc rupture-to OR for splenectomy. 10/16>> Self-extubated  10/18>> Increased O2 requirement to 8L 10/19>> O2 weaned to 4L 10/20>> transferred to [facility]    Subjective: Lying comfortably in bed-denies any chest pain or shortness of breath.  Objective: Vitals: Blood pressure (!) 141/70, pulse (!) 104, temperature 98.4 F (36.9 C), temperature source Oral, resp. rate (!) 25, height 6' (1.829 m), weight 93.2 kg, SpO2 97 %.   Exam: Gen Exam:Alert awake-not in any distress-looks very frail/deconditioned. HEENT:atraumatic, normocephalic Chest: B/L clear to auscultation anteriorly CVS:S1S2 regular Abdomen:soft-appropriately tender. Extremities:no edema Neurology: Non focal-with generalized weakness. Skin: no rash  Pertinent Labs/Radiology: CBC Latest Ref Rng & Units 01/12/2021 01/11/2021 01/10/2021  WBC  4.0 - 10.5 K/uL 21.8(H) 21.1(H) 20.5(H)  Hemoglobin 13.0 - 17.0 g/dL 7.9(L) 7.6(L) 8.1(L)  Hematocrit 39.0 - 52.0 % 25.1(L) 24.3(L) 25.2(L)  Platelets 150 - 400 K/uL 468(H) 391 344    BMP Latest Ref Rng & Units 01/12/2021 01/11/2021 01/10/2021  Glucose 70 - 99 mg/dL 191(H) 188(H) 182(H)  BUN 8 - 23 mg/dL 23 20 26(H)  Creatinine 0.61 - 1.24 mg/dL 1.09 0.98 1.29(H)  BUN/Creat Ratio 10 - 24 - - -  Sodium 135 - 145 mmol/L 141 136 146(H)  Potassium 3.5 - 5.1 mmol/L 4.2 3.7 3.8  Chloride 98 - 111 mmol/L 105 107 114(H)  CO2 22 - 32 mmol/L '26 23 22  '$ Calcium 8.9 - 10.3 mg/dL 8.9 8.6(L) 8.7(L)    Microbiology: 10/09>> blood culture: 1/2 positive for bacteroides species. 10/09>> pelvic fluid/abscess: E. coli/Enterococcus Durans (pansensitive) 10/17>>Blood culture: No growth 10/10>>Urine Culture: No growth  Summary of Radiology/Echo 10/09>> CT abdomen: Acute appendicitis with perforation 10/14>> CT abdomen: Splenic rupture with large splenic hematoma 10/15>> TTE: EF 45-50%  Assessment/Plan:  Sepsis due to acute appendicitis with perforation/intra-abdominal abscesses and Bacteroides bacteremia (s/p laparotomy/ileocecectomy and drainage of intra-abdominal abscess): Sepsis physiology has resolved following laparotomy and drainage of abscess-continues to have persistent leukocytosis-on IV Zosyn-tolerating clear liquids-but remains on TNA-NG tube to be discontinued today-General surgery following-we will defer further postop care to general surgery   Hemorrhagic shock due to spontaneous splenic rupture s/p splenectomy on 10/14: Continue to follow hemoglobin-we will need to plan on vaccine-2 weeks from laparotomy   Acute hypoxic respiratory failure due to atelectasis/pleural effusion: Self extubated on 10/16-doing well-continue to slowly titrate down FiO2.  Encourage pulmonary hygiene/IS/mobilization.  Acute metabolic encephalopathy: Rapidly improving-continue supportive care.  AKI on CKD stage  IIIa: AKI hemodynamically mediated-improving with  supportive care.  HTN: BP stable-continue Coreg-losartan remains on hold  Nonischemic cardiomyopathy/HFrEF: Not in exacerbation-we will start oral Lasix and monitor volume status closely.  Nonobstructive CAD: No anginal symptoms-already on beta-blocker-we will resume aspirin/statin over the next few days when more stable.  DM-2 (A1c 6.4 on 06/02/2020): CBGs stable-continue SSI  Recent Labs    01/12/21 0348 01/12/21 0759 01/12/21 1227  GLUCAP 172* 248* 235*     HLD: Resume statin/fenofibrate when oral intake was stable.  COPD: Stable-continue bronchodilators  Functional quadriplegia/debility/deconditioning: Due to acute illness-continue PT/OT eval-either SNF or LTAC on discharge.  Other issues: Has subclavian TLC since 10/14-on TNA-May need to be removed and switch to a PICC line if he continues to require TNA.  Nutrition Status: Nutrition Problem: Moderate Malnutrition Etiology: acute illness, chronic illness (perforated appendix, CAD, cardiomyopathy) Signs/Symptoms: mild muscle depletion, mild fat depletion Interventions: TPN   Obesity: Estimated body mass index is 27.87 kg/m as calculated from the following:   Height as of this encounter: 6' (1.829 m).   Weight as of this encounter: 93.2 kg.    Procedures: 10/9>>exploratory laparotomy, ileocecectomy, drainage of intraabdominal abscess  10/14>> exploratory laparotomy and splenectomy 10/14>> left subclavian triple-lumen catheter  Consults: General surgery, PCCM DVT Prophylaxis: Heparin Code Status:DNR Family Communication: Sister-in-law at bedside  Time spent: 18 minutes-Greater than 50% of this time was spent in counseling, explanation of diagnosis, planning of further management, and coordination of care.  Diet: Diet Order             Diet clear liquid Room service appropriate? Yes; Fluid consistency: Thin  Diet effective now                       Disposition Plan: Status is: Inpatient  Remains inpatient appropriate because: Requires inpatient level of care.   Barriers to Discharge: Perforated appendix with pelvic abscess/splenic rupture-s/p laparotomy/splenectomy-not yet stable for discharge.  Antimicrobial agents: Anti-infectives (From admission, onward)    Start     Dose/Rate Route Frequency Ordered Stop   01/09/21 1100  piperacillin-tazobactam (ZOSYN) IVPB 3.375 g  Status:  Discontinued        3.375 g 12.5 mL/hr over 240 Minutes Intravenous Every 8 hours 01/09/21 1000 01/12/21 0835   01/09/21 0830  ceFEPIme (MAXIPIME) 2 g in sodium chloride 0.9 % 100 mL IVPB  Status:  Discontinued        2 g 200 mL/hr over 30 Minutes Intravenous Every 12 hours 01/09/21 0737 01/09/21 1000   01/08/21 1400  metroNIDAZOLE (FLAGYL) IVPB 500 mg  Status:  Discontinued        500 mg 100 mL/hr over 60 Minutes Intravenous Every 12 hours 01/08/21 1059 01/09/21 1000   01/08/21 1400  ceFEPIme (MAXIPIME) 2 g in sodium chloride 0.9 % 100 mL IVPB  Status:  Discontinued        2 g 200 mL/hr over 30 Minutes Intravenous Every 24 hours 01/08/21 1114 01/09/21 0737   01/08/21 1000  vancomycin (VANCOREADY) IVPB 750 mg/150 mL  Status:  Discontinued        750 mg 150 mL/hr over 60 Minutes Intravenous Every 24 hours 01/07/21 2052 01/09/21 1000   01/08/21 0600  vancomycin (VANCOREADY) IVPB 1500 mg/300 mL  Status:  Discontinued        1,500 mg 150 mL/hr over 120 Minutes Intravenous Every 24 hours 01/07/21 0430 01/07/21 1210   01/08/21 0600  vancomycin (VANCOREADY) IVPB 750 mg/150 mL  Status:  Discontinued  750 mg 150 mL/hr over 60 Minutes Intravenous Every 24 hours 01/07/21 1210 01/07/21 2052   01/07/21 1400  piperacillin-tazobactam (ZOSYN) IVPB 3.375 g  Status:  Discontinued        3.375 g 12.5 mL/hr over 240 Minutes Intravenous Every 8 hours 01/07/21 0430 01/08/21 1114   01/07/21 0530  piperacillin-tazobactam (ZOSYN) IVPB 3.375 g        3.375  g 100 mL/hr over 30 Minutes Intravenous STAT 01/07/21 0430 01/07/21 0622   01/07/21 0530  vancomycin (VANCOREADY) IVPB 1750 mg/350 mL        1,750 mg 175 mL/hr over 120 Minutes Intravenous  Once 01/07/21 0430 01/07/21 0841   01/05/21 1430  Ampicillin-Sulbactam (UNASYN) 3 g in sodium chloride 0.9 % 100 mL IVPB        3 g 200 mL/hr over 30 Minutes Intravenous Every 6 hours 01/05/21 1334 01/06/21 1552   01/01/21 2200  piperacillin-tazobactam (ZOSYN) IVPB 3.375 g        3.375 g 12.5 mL/hr over 240 Minutes Intravenous Every 8 hours 01/01/21 1841 01/04/21 2359   01/01/21 2100  piperacillin-tazobactam (ZOSYN) IVPB 3.375 g  Status:  Discontinued        3.375 g 100 mL/hr over 30 Minutes Intravenous Every 8 hours 01/01/21 1836 01/01/21 1841   01/01/21 1345  piperacillin-tazobactam (ZOSYN) IVPB 3.375 g        3.375 g 100 mL/hr over 30 Minutes Intravenous  Once 01/01/21 1337 01/01/21 1430        MEDICATIONS: Scheduled Meds:  sodium chloride   Intravenous Once   sodium chloride   Intravenous Once   acetaminophen  1,000 mg Oral Q6H   carvedilol  12.5 mg Per Tube BID WC   Chlorhexidine Gluconate Cloth  6 each Topical Daily   heparin injection (subcutaneous)  5,000 Units Subcutaneous Q8H   insulin aspart  0-20 Units Subcutaneous Q4H   ipratropium-albuterol  3 mL Nebulization TID   mouth rinse  15 mL Mouth Rinse BID   [START ON 01/13/2021] pantoprazole sodium  40 mg Oral Daily   sodium chloride flush  10-40 mL Intracatheter Q12H   sodium chloride flush  3 mL Intravenous Q12H   Continuous Infusions:  sodium chloride 10 mL/hr at 01/05/21 2306   sodium chloride Stopped (01/06/21 0324)   sodium chloride     TPN ADULT (ION) 100 mL/hr at 01/11/21 1728   TPN ADULT (ION)     PRN Meds:.sodium chloride, Place/Maintain arterial line **AND** sodium chloride, albuterol, dextromethorphan, HYDROmorphone (DILAUDID) injection, hydroxypropyl methylcellulose / hypromellose, methocarbamol, ondansetron  (ZOFRAN) IV, oxyCODONE, phenol, sodium chloride flush   I have personally reviewed following labs and imaging studies  LABORATORY DATA: CBC: Recent Labs  Lab 01/08/21 0446 01/09/21 0332 01/10/21 0219 01/11/21 0744 01/12/21 0750  WBC 17.4* 22.7* 20.5* 21.1* 21.8*  NEUTROABS 15.5* 20.7* 18.1*  --   --   HGB 8.3* 8.4* 8.1* 7.6* 7.9*  HCT 24.4* 25.2* 25.2* 24.3* 25.1*  MCV 89.7 91.0 94.4 96.8 97.3  PLT 248 297 344 391 468*    Basic Metabolic Panel: Recent Labs  Lab 01/07/21 0639 01/08/21 0446 01/09/21 0332 01/10/21 0219 01/11/21 0419 01/12/21 0416  NA 137 138 142 146* 136 141  K 4.0 3.7 3.6 3.8 3.7 4.2  CL 104 106 111 114* 107 105  CO2 19* 19* '22 22 23 26  '$ GLUCOSE 178* 158* 152* 182* 188* 191*  BUN 36* 48* 40* 26* 20 23  CREATININE 3.01* 2.74* 1.84* 1.29* 0.98 1.09  CALCIUM 7.9* 8.0* 8.3* 8.7* 8.6* 8.9  MG 2.0  --  2.9* 2.3 1.9 1.7  PHOS 5.3*  --  3.1 2.3* 2.9 3.6    GFR: Estimated Creatinine Clearance: 80.1 mL/min (by C-G formula based on SCr of 1.09 mg/dL).  Liver Function Tests: Recent Labs  Lab 01/07/21 0849 01/08/21 0446 01/09/21 0332 01/10/21 0219 01/12/21 0416  AST '28 22 27 21 20  '$ ALT '18 17 17 17 16  '$ ALKPHOS 64 60 98 116 140*  BILITOT 2.6* 2.5* 2.0* 1.4* 0.8  PROT 4.9* 4.9* 5.2* 5.4* 5.5*  ALBUMIN 1.6* <1.5* <1.5* 1.5* 1.5*   No results for input(s): LIPASE, AMYLASE in the last 168 hours. No results for input(s): AMMONIA in the last 168 hours.  Coagulation Profile: Recent Labs  Lab 01/06/21 0308 01/06/21 0835  INR 1.3* 1.2    Cardiac Enzymes: No results for input(s): CKTOTAL, CKMB, CKMBINDEX, TROPONINI in the last 168 hours.  BNP (last 3 results) Recent Labs    06/02/20 1431  PROBNP 61.0    Lipid Profile: Recent Labs    01/10/21 0219 01/11/21 0419  TRIG 223* 203*    Thyroid Function Tests: No results for input(s): TSH, T4TOTAL, FREET4, T3FREE, THYROIDAB in the last 72 hours.  Anemia Panel: No results for input(s):  VITAMINB12, FOLATE, FERRITIN, TIBC, IRON, RETICCTPCT in the last 72 hours.  Urine analysis:    Component Value Date/Time   COLORURINE AMBER (A) 01/01/2021 1338   APPEARANCEUR HAZY (A) 01/01/2021 1338   APPEARANCEUR Cloudy 11/19/2012 1107   LABSPEC >1.046 (H) 01/01/2021 1338   LABSPEC 1.024 11/19/2012 1107   PHURINE 5.0 01/01/2021 1338   GLUCOSEU 50 (A) 01/01/2021 1338   GLUCOSEU Negative 11/19/2012 1107   HGBUR SMALL (A) 01/01/2021 1338   BILIRUBINUR NEGATIVE 01/01/2021 1338   BILIRUBINUR Negative 11/19/2012 1107   KETONESUR NEGATIVE 01/01/2021 1338   PROTEINUR 30 (A) 01/01/2021 1338   UROBILINOGEN 0.2 09/14/2013 1225   NITRITE NEGATIVE 01/01/2021 1338   LEUKOCYTESUR NEGATIVE 01/01/2021 1338   LEUKOCYTESUR Negative 11/19/2012 1107    Sepsis Labs: Lactic Acid, Venous    Component Value Date/Time   LATICACIDVEN 1.9 01/06/2021 1128    MICROBIOLOGY: Recent Results (from the past 240 hour(s))  MRSA Next Gen by PCR, Nasal     Status: None   Collection Time: 01/07/21  8:38 AM   Specimen: Nasal Mucosa; Nasal Swab  Result Value Ref Range Status   MRSA by PCR Next Gen NOT DETECTED NOT DETECTED Final    Comment: (NOTE) The GeneXpert MRSA Assay (FDA approved for NASAL specimens only), is one component of a comprehensive MRSA colonization surveillance program. It is not intended to diagnose MRSA infection nor to guide or monitor treatment for MRSA infections. Test performance is not FDA approved in patients less than 37 years old. Performed at [facility], [place] [address], [place], [place] [address]   Culture, blood (routine x 2)     Status: None (Preliminary result)   Collection Time: 01/09/21 10:19 AM   Specimen: BLOOD LEFT HAND  Result Value Ref Range Status   Specimen Description BLOOD LEFT HAND  Final   Special Requests   Final    BOTTLES DRAWN AEROBIC AND ANAEROBIC Blood Culture adequate volume   Culture   Final    NO GROWTH 3 DAYS Performed at [facility], [place] [address] [place],  [address]    Report Status PENDING  Incomplete  Culture, blood (routine x 2)  Status: None (Preliminary result)   Collection Time: 01/09/21 10:30 AM   Specimen: BLOOD LEFT HAND  Result Value Ref Range Status   Specimen Description BLOOD LEFT HAND  Final   Special Requests   Final    BOTTLES DRAWN AEROBIC ONLY Blood Culture results may not be optimal due to an inadequate volume of blood received in culture bottles   Culture   Final    NO GROWTH 3 DAYS Performed at [facility], [place] [address], [place], [place] [address]    Report Status PENDING  Incomplete    RADIOLOGY STUDIES/RESULTS: DG Abd Portable 1V  Result Date: 01/10/2021 CLINICAL DATA:  NG tube placement EXAM: PORTABLE ABDOMEN - 1 VIEW COMPARISON:  KUB 01/06/2021 FINDINGS: The enteric catheter tip and side hole project over the stomach. There is a nonobstructive bowel gas pattern. Enteric contrast is seen in the right colon, partially imaged. The lungs are better assessed on the same day chest radiograph. IMPRESSION: NG tube tip and side hole in the stomach. Electronically Signed   By: Valetta Mole M.D.   On: 01/10/2021 16:39     LOS: 11 days   Oren Binet, MD  [facility]    To contact the attending provider between 7A-7P or the covering provider during after hours 7P-7A, please log into the web site [email or web address] and access using universal [place] password for that web site. If you do not have the password, please call the hospital operator.  01/12/2021, 1:00 PM

## 2021-01-12 NOTE — Care Management Important Message (Signed)
Important Message  Patient Details  Name: Travis [name] MRN: [id number] Date of Birth: 03-Nov-1955   Medicare Important Message Given:  Yes     Orbie Pyo 01/12/2021, 2:39 PM

## 2021-01-12 NOTE — Progress Notes (Signed)
Physical Therapy Treatment Patient Details Name: Travis [name] MRN: [id number] DOB: [birth year] Today's Date: 01/12/2021   History of Present Illness Pt is a 65 y/o male admitted 10/9 with complaints of R lower quadrant abdominal pain. Found with intra-abdominal sepsis with perforated appendix and abdominal abscess; s/p diagnostic laparoscopy, ileocecectomy, drainage of interaabdominal abscess 10/9.  Post op ileus. 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 Pt self extubated 10/16. PMHx: CHF, CKD, COPD, TIA, diabetes, HTN, gout, CAD.    PT Comments    Patient able to progress finally to standing and some side steps to head of bed with walker, though refused up in chair.  Patient needing pushing and motivation helpful family in the room as well.  Feel he needs continued skilled PT in the acute setting and follow up SNF level rehab at d/c.    Recommendations for follow up therapy are one component of a multi-disciplinary discharge planning process, led by the attending physician.  Recommendations may be updated based on patient status, additional functional criteria and insurance authorization.  Follow Up Recommendations  SNF;Supervision/Assistance - 24 hour     Equipment Recommendations  Rolling walker with 5" wheels    Recommendations for Other Services       Precautions / Restrictions Precautions Precautions: Fall Precaution Comments: abdominal incision, JP drain R, wound VAC     Mobility  Bed Mobility Overal bed mobility: Needs Assistance Bed Mobility: Rolling;Sidelying to Sit;Sit to Sidelying Rolling: Mod assist Sidelying to sit: Mod assist;+2 for physical assistance;HOB elevated     Sit to sidelying: Mod assist;+2 for safety/equipment General bed mobility comments: cues and guiding for pt to use rail and assist for legs off then on the bed, with +2 for lines and repositioning    Transfers Overall transfer level: Needs assistance Equipment used: Rolling  walker (2 wheeled) Transfers: Sit to/from Stand Sit to Stand: Mod assist;+2 physical assistance;From elevated surface         General transfer comment: elevated bed and increased time to scoot to EOB And manage lines, then stood wtih +2 lifting help to RW, side steps to [facility] with mod A as well and pt declining up in chair despite encouragement  Ambulation/Gait                 Stairs             Wheelchair Mobility    Modified Rankin (Stroke Patients Only)       Balance Overall balance assessment: Needs assistance Sitting-balance support: Feet supported Sitting balance-Leahy Scale: Poor Sitting balance - Comments: leaning back on EOB   Standing balance support: Bilateral upper extremity supported Standing balance-Leahy Scale: Poor Standing balance comment: UE support wtih A for balance                            Cognition Arousal/Alertness: Awake/alert Behavior During Therapy: Flat affect Overall Cognitive Status: Impaired/Different from baseline Area of Impairment: Attention;Following commands;Safety/judgement;Memory                   Current Attention Level: Sustained Memory: Decreased short-term memory;Decreased recall of precautions Following Commands: Follows one step commands consistently;Follows one step commands with increased time Safety/Judgement: Decreased awareness of deficits;Decreased awareness of safety   Problem Solving: Decreased initiation;Requires verbal cues;Requires tactile cues General Comments: sister and brother in law in the room, encouraging pt      Exercises      General Comments General comments (  skin integrity, edema, etc.): on 4L O2 was 95% at rest on RA as pt removed cannula, but pt SOB so reapplied, HR up to 127 with standing and after return to supine slow return to low 100's and 90's      Pertinent Vitals/Pain Pain Assessment: Faces Faces Pain Scale: Hurts whole lot Pain Location: stomach Pain  Descriptors / Indicators: Discomfort;Moaning;Guarding;Operative site guarding Pain Intervention(s): Monitored during session;Repositioned    Home Living                      Prior Function            PT Goals (current goals can now be found in the care plan section) Progress towards PT goals: Progressing toward goals    Frequency    Min 3X/week      PT Plan Current plan remains appropriate    Co-evaluation              AM-PAC PT "6 Clicks" Mobility   Outcome Measure  Help needed turning from your back to your side while in a flat bed without using bedrails?: A Little Help needed moving from lying on your back to sitting on the side of a flat bed without using bedrails?: A Lot Help needed moving to and from a bed to a chair (including a wheelchair)?: A Lot Help needed standing up from a chair using your arms (e.g., wheelchair or bedside chair)?: Total Help needed to walk in hospital room?: A Lot Help needed climbing 3-5 steps with a railing? : Total 6 Click Score: 11    End of Session Equipment Utilized During Treatment: Oxygen Activity Tolerance: Patient limited by pain Patient left: in bed;with call bell/phone within reach;with bed alarm set;with family/visitor present   PT Visit Diagnosis: Difficulty in walking, not elsewhere classified (R26.2);Other abnormalities of gait and mobility (R26.89);Muscle weakness (generalized) (M62.81);Pain Pain - part of body:  (abdomen)     Time: 1413-1440 PT Time Calculation (min) (ACUTE ONLY): 27 min  Charges:  $Therapeutic Activity: 23-37 mins                     Magda Kiel, PT Acute Rehabilitation Services [id number] Office:[phone] 01/12/2021    Travis [name] 01/12/2021, 5:33 PM

## 2021-01-13 ENCOUNTER — Inpatient Hospital Stay (HOSPITAL_COMMUNITY): Payer: Medicare HMO

## 2021-01-13 ENCOUNTER — Telehealth: Payer: Medicare HMO

## 2021-01-13 ENCOUNTER — Inpatient Hospital Stay: Payer: Self-pay

## 2021-01-13 ENCOUNTER — Telehealth: Payer: Self-pay

## 2021-01-13 DIAGNOSIS — Z7189 Other specified counseling: Secondary | ICD-10-CM | POA: Diagnosis not present

## 2021-01-13 DIAGNOSIS — N1831 Chronic kidney disease, stage 3a: Secondary | ICD-10-CM | POA: Diagnosis not present

## 2021-01-13 DIAGNOSIS — I5022 Chronic systolic (congestive) heart failure: Secondary | ICD-10-CM | POA: Diagnosis not present

## 2021-01-13 DIAGNOSIS — K3532 Acute appendicitis with perforation and localized peritonitis, without abscess: Secondary | ICD-10-CM | POA: Diagnosis not present

## 2021-01-13 DIAGNOSIS — J439 Emphysema, unspecified: Secondary | ICD-10-CM | POA: Diagnosis not present

## 2021-01-13 DIAGNOSIS — Z515 Encounter for palliative care: Secondary | ICD-10-CM | POA: Diagnosis not present

## 2021-01-13 LAB — GLUCOSE, CAPILLARY
Glucose-Capillary: 184 mg/dL — ABNORMAL HIGH (ref 70–99)
Glucose-Capillary: 232 mg/dL — ABNORMAL HIGH (ref 70–99)
Glucose-Capillary: 239 mg/dL — ABNORMAL HIGH (ref 70–99)
Glucose-Capillary: 252 mg/dL — ABNORMAL HIGH (ref 70–99)
Glucose-Capillary: 262 mg/dL — ABNORMAL HIGH (ref 70–99)
Glucose-Capillary: 264 mg/dL — ABNORMAL HIGH (ref 70–99)

## 2021-01-13 LAB — CBC
HCT: 25.5 % — ABNORMAL LOW (ref 39.0–52.0)
Hemoglobin: 8.1 g/dL — ABNORMAL LOW (ref 13.0–17.0)
MCH: 30.7 pg (ref 26.0–34.0)
MCHC: 31.8 g/dL (ref 30.0–36.0)
MCV: 96.6 fL (ref 80.0–100.0)
Platelets: 525 10*3/uL — ABNORMAL HIGH (ref 150–400)
RBC: 2.64 MIL/uL — ABNORMAL LOW (ref 4.22–5.81)
RDW: 16.2 % — ABNORMAL HIGH (ref 11.5–15.5)
WBC: 19.4 10*3/uL — ABNORMAL HIGH (ref 4.0–10.5)
nRBC: 0.2 % (ref 0.0–0.2)

## 2021-01-13 LAB — BASIC METABOLIC PANEL
Anion gap: 5 (ref 5–15)
BUN: 24 mg/dL — ABNORMAL HIGH (ref 8–23)
CO2: 26 mmol/L (ref 22–32)
Calcium: 8.9 mg/dL (ref 8.9–10.3)
Chloride: 104 mmol/L (ref 98–111)
Creatinine, Ser: 0.93 mg/dL (ref 0.61–1.24)
GFR, Estimated: >60 mL/min (ref >60)
Glucose, Bld: 238 mg/dL — ABNORMAL HIGH (ref 70–99)
Potassium: 4.3 mmol/L (ref 3.5–5.1)
Sodium: 135 mmol/L (ref 135–145)

## 2021-01-13 LAB — MAGNESIUM: Magnesium: 2.1 mg/dL (ref 1.7–2.4)

## 2021-01-13 MED ORDER — IPRATROPIUM-ALBUTEROL 0.5-2.5 (3) MG/3ML IN SOLN
3.0000 mL | Freq: Four times a day (QID) | RESPIRATORY_TRACT | Status: DC
  Administered 2021-01-13: 3 mL via RESPIRATORY_TRACT
  Filled 2021-01-13: qty 3

## 2021-01-13 MED ORDER — FUROSEMIDE 10 MG/ML IJ SOLN
40.0000 mg | Freq: Two times a day (BID) | INTRAMUSCULAR | Status: DC
  Administered 2021-01-13 – 2021-01-15 (×5): 40 mg via INTRAVENOUS
  Filled 2021-01-13 (×5): qty 4

## 2021-01-13 MED ORDER — FUROSEMIDE 10 MG/ML IJ SOLN
40.0000 mg | Freq: Once | INTRAMUSCULAR | Status: AC
  Administered 2021-01-13: 40 mg via INTRAVENOUS
  Filled 2021-01-13: qty 4

## 2021-01-13 MED ORDER — FUROSEMIDE 40 MG PO TABS
40.0000 mg | ORAL_TABLET | Freq: Every day | ORAL | Status: DC
Start: 2021-01-14 — End: 2021-01-13

## 2021-01-13 MED ORDER — IPRATROPIUM-ALBUTEROL 0.5-2.5 (3) MG/3ML IN SOLN
3.0000 mL | Freq: Two times a day (BID) | RESPIRATORY_TRACT | Status: DC
  Administered 2021-01-13 – 2021-01-15 (×3): 3 mL via RESPIRATORY_TRACT
  Filled 2021-01-13 (×4): qty 3

## 2021-01-13 MED ORDER — METHOCARBAMOL 500 MG PO TABS
500.0000 mg | ORAL_TABLET | Freq: Four times a day (QID) | ORAL | Status: DC
  Administered 2021-01-13 – 2021-02-09 (×108): 500 mg via ORAL
  Filled 2021-01-13 (×107): qty 1

## 2021-01-13 MED ORDER — TRAVASOL 10 % IV SOLN
INTRAVENOUS | Status: AC
  Filled 2021-01-13: qty 1296

## 2021-01-13 MED ORDER — INSULIN ASPART 100 UNIT/ML IJ SOLN
4.0000 [IU] | Freq: Two times a day (BID) | INTRAMUSCULAR | Status: AC
  Administered 2021-01-13 (×2): 4 [IU] via SUBCUTANEOUS

## 2021-01-13 MED ORDER — ALBUTEROL SULFATE (2.5 MG/3ML) 0.083% IN NEBU: 3.0000 mL | INHALATION_SOLUTION | RESPIRATORY_TRACT | Status: DC | PRN

## 2021-01-13 NOTE — Progress Notes (Signed)
PHARMACY - TOTAL PARENTERAL NUTRITION CONSULT NOTE   Indication: Prolonged ileus  Patient Measurements: Height: 6' (182.9 cm) Weight: 92 kg (202 lb 13.2 oz) IBW/kg (Calculated) : 77.6   Body mass index is 27.51 kg/m.  Assessment: 5 yom presenting 10/9 with acute perforated appendix, and abdominal abscess s/p OR for ex-lap, ilececectomy, and drainage of abscess on 10/9. Also had splenic laceration, s/p ex-lap, splenectomy, and wound vac application 45/62. Transferred to ICU on 10/12 with septic vs. hemorrhagic shock, intubated and on vasopressor support. Plan was initially to transition to comfort measures 10/14; however improved overnight with decreasing vasopressor requirement. Post-op ileus. Pharmacy consulted to start TPN.  Noted - BM documented since TPN consult placed (01/12/21). Per discussion with Surgery, will be at least 48 hrs until tube feeds started and likely longer until at goal. TPN will still be started today with patient already at least 8 days without nutrition. Current diet order is NPO   Glucose / Insulin: hx DM2 (no meds documented PTA). CBGs 120 to 130 prior to TPN start. CBGs 142-214 since TPN rate increase with two CBGs of 214. Utilized 26 units SSI since new bag was initiated (12 hours).  Electrolytes: Na 135, K 4.3 (goal >/=4 with ileus), Cl 104, CoCa 10.9 (Ca removed on 10/17), Phos 3.6 - increased from 2.9, Mg 2.1 - increased from 1.7 Renal: AKI on CKD improving and now close to baseline. [place] 0.93 BUN 24- decrease.  Hepatic: LFTs wnl. TG 203 - decreased from 10/17 but increased from 138 on 10/10. Tbili down to 0.8, albumin 1.5 Intake / Output; MIVF: UOP 1.25 ml/kg/hr, NGT removed 10/20 JP drain output not documented 10/19; LBM 10/16 GI Imaging: none since TPN start GI Surgeries / Procedures: none since TPN start  Central access: CVC triple lumen 10/14 TPN start date: 01/08/21  Nutritional Goals: Goal TPN rate is 100 mL/hr (provides 130 g of protein and 2421  kcals per day)  RD Assessment: Estimated Needs Total Energy Estimated Needs: 2400-2600 Total Protein Estimated Needs: 125-150 grams Total Fluid Estimated Needs: > 2 L  Current Nutrition:  DC NGT, CLD  Plan:  TPN increased to goal rate of 100 ml/hr at 1800 based on updated RD recommendations on 10/17 Boost breeze po bid (250 Kcal and 9 grams protein) No zofran given in past 24 hours. Patient reports nausea 10/21 Magnesium 2 grams iv x1 today Electrolytes in TPN: Na 30 meq, increased Mg to 5 mEq/L. K 50 mEq/L, continue Ca 0 mEq/L, Phos 15 mmol/L. Adjust Cl:Ac to max acetate.  Add standard MVI and trace elements to TPN + folic acid and thiamine with hx ETOH abuse Continue resistant iSS q4h and add 60 units of insulin to TPN Monitor TPN labs on Mon/Thurs, repeat phos/Bmet tomorrow Repeat TG tomorrow morning and continue to monitor closely for need to give lipids on MWF F/u return of bowel function, ability to initiate tube feeds and wean off TPN  Travis [name] BS, PharmD, BCPS Clinical Pharmacist 01/13/2021 7:04 AM

## 2021-01-13 NOTE — Progress Notes (Signed)
Palliative Medicine Inpatient Follow Up Note  Consulting Provider: Mollie Germany, RN   Reason for consult:   Palliative Care Consult Services Palliative Medicine Consult  Reason for Consult? Pt is needing increasing pressor/vent support, 2 major abdominal surgeries. RN and MD have spoken with family multiple times about comfort care but family is still having some trouble processing everything. Thank you!    HPI:  Per intake H&P --> [name] is a 65 year old gentleman with an extended PMH that includes but not limited to CHF, CKD 3A, COPD, CRA O, TIA, diabetes, hypertension, gout, hyperlipidemia, CAD (nonobstructive). He presented to the ED on 01/01/21 with complaints of right lower quadrant abdominal pain --> taken to the operating room where he was found to have a perforated appendix and abdominal abscess. 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 Now with rapidly worsening shock. Palliative care was asked to get involved to further address goals of care in the setting of overall critical illness and ongoing declining state.   Today's Discussion (01/13/2021):  *Please note that this is a verbal dictation therefore any spelling or grammatical errors are due to the "Dragon [employer] One" system interpretation.  Chart reviewed. Patient has advanced to thin liquids.   I met with Travis [name] at bedside. He shares that he is feeling "alright". We reviewed that he needs to be cleaned up. Travis [name] shares that he is contending with bouts of intermittent pain that are controlled for a short time with pain medicines (dilaudid and oxycodone). I requested that he ask for these medicines as they are needed.   I spoke to patients niece Travis [name] who plans on coming tomorrow. She shares that this week has been full of ups and downs. We reviewed the idea of meeting tomorrow for a formal touch point conversation of where we are at presently.   Objective  Assessment: Vital Signs Vitals:   01/13/21 0924 01/13/21 1148  BP:  112/71  Pulse:  (!) 113  Resp:  19  Temp:  99.1 F (37.3 C)  SpO2: 97% 98%    Intake/Output Summary (Last 24 hours) at 01/13/2021 1500 Last data filed at 01/13/2021 0605 Gross per 24 hour  Intake --  Output 1125 ml  Net -1125 ml    Last Weight  Most recent update: 01/13/2021  6:50 AM    Weight  92 kg (202 lb 13.2 oz)            Gen: Very ill appearing AA M HEENT: Dry mucous membranes CV: Irregular rate and rhythm  PULM: On 2LPM [place] ABD: soft/nontender  EXT: No edema  Neuro: Alert and oriented, able to converse  SUMMARY OF RECOMMENDATIONS   DNAR   Continue current level of care - Patient goals are for continued optimization  Plan to meet tomorrow at 15:30    Patient is a member of [employer], will request chaplain support   Palliative care will continue to peripherally follow as goals remain for improvement  Time Spent: 25 Greater than 50% of the time was spent in counseling and coordination of care ______________________________________________________________________________________ Travis [name] [place] Palliative Medicine Team Team Cell Phone: [phone] Please utilize secure chat with additional questions, if there is no response within 30 minutes please call the above phone number  Palliative Medicine Team providers are available by phone from 7am to 7pm daily and can be reached through the team cell phone.  Should this patient require assistance outside of these hours,  please call the patient's attending physician.

## 2021-01-13 NOTE — Telephone Encounter (Signed)
  Care Management   Follow Up Note   01/13/2021 Name: Travis [name] MRN: [id number] DOB: [birth year]   Referred by: [name], NP Reason for referral : Chronic Care Management (Anemia, HF)   An unsuccessful telephone outreach was attempted today. The patient was referred to the case management team for assistance with care management and care coordination.   Follow Up Plan: A HIPPA compliant phone message was left for the patient providing contact information and requesting a return call.   [name] RN,BSN,CCM RN Case Manager [place]  [phone]

## 2021-01-13 NOTE — Progress Notes (Signed)
PT Cancellation Note  Patient Details Name: Travis [name] MRN: [id number] DOB: Jul 24, 1955   Cancelled Treatment:    Reason Eval/Treat Not Completed: Medical issues which prohibited therapy.  Pt had RR this AM for labored breathing and elevation of HR.  Hold PT and retry at another time.   Ramond Dial 01/13/2021, 10:37 AM  Mee Hives, PT PhD Acute Rehab Dept. Number: [place] [facility] [place]

## 2021-01-13 NOTE — Progress Notes (Signed)
Cross-coverage note:   Patient seen for acute respiratory distress. He is tachypneic with labored respirations, able to speak several words at a time. States that he woke with SOB, is willing to try BiPAP if needed. Lung sounds diminished L>R.   He appears to be improving with albuterol treatment. Plan to check CXR, use BiPAP if needed.

## 2021-01-13 NOTE — Significant Event (Signed)
Rapid Response Event Note   Reason for Call :  SOB  Initial Focused Assessment:  Pt lying in bed with eyes open. He is alert and oriented, c/o SOB, denies chest pain. His breathing is tachypneic and labored with accessory muscle use. Lungs diminished with wheezes. Skin cool to touch.  Pt was seen last night during PM rounds. At that time he was tachypneic(upper 20s) but breathing wasn't labored and he denied SOB.   HR-118, BP-159/79, RR-29, SpO2-99% on 4L [place].  Interventions:  Albuterol tx PCXR Bipap prn SOB Plan of Care:  Pt looks and feels a lot better after breathing tx. Await PCXR results and relay to MD. May place pt on bipap needed-RT aware. Continue to monitor closely. Call RRT if further assistance needed.    Event Summary:   MD Notified: Dr. Myna Hidalgo notified and came to bedside  Call McPherson, Estee Yohe Anderson, RN

## 2021-01-13 NOTE — Progress Notes (Signed)
PROGRESS NOTE        PATIENT DETAILS Name: Travis [name] Age: 65 y.o. Sex: male Date of Birth: 03-Feb-1956 Admit Date: 01/01/2021 Admitting Physician Georganna Skeans, MD ZJ:[id number], Alyson Locket, NP  Brief Narrative: Patient is a 65 y.o. male with history of HFrEF, CKD stage III, COPD, HTN, DM-2, HLD, nonobstructive CAD-who presented with abdominal pain-found to have sepsis from ruptured appendicitis-underwent exploratory laparotomy/ileocecectomy and drainage of intra-abdominal abscess.Patient was briefly managed in the ICU-unfortunately-on 10/14 he developed severe abdominal pain -he was then found to have hemorrhagic shock due to a spontaneous splenic rupture-he was reevaluated by general surgery and underwent a splenectomy.  He was readmitted to the ICU-remained on the ventilator-he self extubated on 10/16-he was monitored closely-upon further stability-transfer to the [facility] on 10/20.  Significant events: 10/09>>admit- ex lap, remained intubated post op-to ICU 10/10>>extubated 10/12>> transferred to [facility] 10/14>>hypotensive/abd pain-CT showed spleenc rupture-to OR for splenectomy. 10/16>> Self-extubated  10/18>> Increased O2 requirement to 8L 10/19>> O2 weaned to 4L 10/20>> transferred to [facility]    Subjective: Developed some shortness of breath last night-responded to bronchodilators.  Looks comfortable-on 2-3 L this morning.  Objective: Vitals: Blood pressure 112/71, pulse (!) 113, temperature 99.1 F (37.3 C), temperature source Oral, resp. rate 19, height 6' (1.829 m), weight 92 kg, SpO2 98 %.   Exam: Gen Exam:Alert awake-not in any distress HEENT:atraumatic, normocephalic Chest: Some scattered rhonchi CVS:S1S2 regular Abdomen:soft non tender, non distended Extremities:no edema Neurology: Non focal Skin: no rash   Pertinent Labs/Radiology: CBC Latest Ref Rng & Units 01/13/2021 01/12/2021 01/11/2021  WBC 4.0 - 10.5 K/uL 19.4(H)  21.8(H) 21.1(H)  Hemoglobin 13.0 - 17.0 g/dL 8.1(L) 7.9(L) 7.6(L)  Hematocrit 39.0 - 52.0 % 25.5(L) 25.1(L) 24.3(L)  Platelets 150 - 400 K/uL 525(H) 468(H) 391    BMP Latest Ref Rng & Units 01/13/2021 01/12/2021 01/11/2021  Glucose 70 - 99 mg/dL 238(H) 191(H) 188(H)  BUN 8 - 23 mg/dL 24(H) 23 20  Creatinine 0.61 - 1.24 mg/dL 0.93 1.09 0.98  BUN/Creat Ratio 10 - 24 - - -  Sodium 135 - 145 mmol/L 135 141 136  Potassium 3.5 - 5.1 mmol/L 4.3 4.2 3.7  Chloride 98 - 111 mmol/L 104 105 107  CO2 22 - 32 mmol/L '26 26 23  '$ Calcium 8.9 - 10.3 mg/dL 8.9 8.9 8.6(L)    Microbiology: 10/09>> blood culture: 1/2 positive for bacteroides species. 10/09>> pelvic fluid/abscess: E. coli/Enterococcus Durans (pansensitive) 10/17>>Blood culture: No growth 10/10>>Urine Culture: No growth  Summary of Radiology/Echo 10/09>> CT abdomen: Acute appendicitis with perforation 10/14>> CT abdomen: Splenic rupture with large splenic hematoma 10/15>> TTE: EF 45-50% 10/20>> CT abdomen/pelvis: Indeterminate 2.4 cm fluid collection in the right lower quadrant adjacent to the cecal resection site.  Moderate left pleural effusion with compressive atelectasis.  Assessment/Plan:  Sepsis due to acute appendicitis with perforation/intra-abdominal abscesses and Bacteroides bacteremia (s/p laparotomy/ileocecectomy and drainage of intra-abdominal abscess on 10/9): Sepsis physiology has resolved-Zosyn discontinued on 10/20.  On clear liquid diet.  CT abdomen on 10/20 with a small indeterminate fluid collection but otherwise stable.  General surgery following and directing care.    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    Acute hypoxic respiratory failure due to atelectasis/pleural effusion: Self extubated  on 10/16-stable on anywhere from 2-4 L of oxygen-developed some transient shortness  of breath last night-which improved with bronchodilators.  Patient appears to have a moderate-sized left-sided pleural effusion which is likely transudative from-HFrEF/effusion reactive to splenectomy.  Hence will increase Lasix to 40 mg twice daily, continue bronchodilators, encourage incentive spirometry/flutter valve-and see how he does.  If these measures do not work-we can consider thoracocentesis.  Acute metabolic encephalopathy: Rapidly improving-continue supportive care.  AKI on CKD stage IIIa: AKI hemodynamically mediated-improving with supportive care.  HTN: BP stable-continue Coreg-losartan remains on hold  Nonischemic cardiomyopathy/HFrEF: Not in exacerbation-we will start oral Lasix and monitor volume status closely.  Nonobstructive CAD: No anginal symptoms-already on beta-blocker-we will resume aspirin/statin over the next few days when more stable.  DM-2 (A1c 6.4 on 06/02/2020): CBGs stable-continue SSI  Recent Labs    01/13/21 0433 01/13/21 0751 01/13/21 1148  GLUCAP 184* 252* 264*      HLD: Resume statin/fenofibrate when oral intake was stable.  COPD: Stable-continue bronchodilators  Functional quadriplegia/debility/deconditioning: Due to acute illness-continue PT/OT eval-either SNF or LTAC on discharge.  Other issues: Has subclavian TLC since 10/14-on TNA-probably needs to be removed-and a PICC line needs to be placed to continue TNA.    Nutrition Status: Nutrition Problem: Moderate Malnutrition Etiology: acute illness, chronic illness (perforated appendix, CAD, cardiomyopathy) Signs/Symptoms: mild muscle depletion, mild fat depletion Interventions: TPN   Obesity: Estimated body mass index is 27.51 kg/m as calculated from the following:   Height as of this encounter: 6' (1.829 m).   Weight as of this encounter: 92 kg.    Procedures: 10/9>>exploratory laparotomy, ileocecectomy, drainage of intraabdominal abscess  10/14>> exploratory laparotomy and  splenectomy 10/14>> left subclavian triple-lumen catheter  Consults: General surgery, PCCM DVT Prophylaxis: Heparin Code Status:DNR Family Communication: Sister-in-law at bedside on 10/20.  Not at bedside today.  Time spent: 35 minutes-Greater than 50% of this time was spent in counseling, explanation of diagnosis, planning of further management, and coordination of care.  Diet: Diet Order             Diet clear liquid Room service appropriate? Yes; Fluid consistency: Thin  Diet effective now                      Disposition Plan: Status is: Inpatient  Remains inpatient appropriate because: Requires inpatient level of care.   Barriers to Discharge: Perforated appendix with pelvic abscess/splenic rupture-s/p laparotomy/splenectomy-not yet stable for discharge.  Antimicrobial agents: Anti-infectives (From admission, onward)    Start     Dose/Rate Route Frequency Ordered Stop   01/09/21 1100  piperacillin-tazobactam (ZOSYN) IVPB 3.375 g  Status:  Discontinued        3.375 g 12.5 mL/hr over 240 Minutes Intravenous Every 8 hours 01/09/21 1000 01/12/21 0835   01/09/21 0830  ceFEPIme (MAXIPIME) 2 g in sodium chloride 0.9 % 100 mL IVPB  Status:  Discontinued        2 g 200 mL/hr over 30 Minutes Intravenous Every 12 hours 01/09/21 0737 01/09/21 1000   01/08/21 1400  metroNIDAZOLE (FLAGYL) IVPB 500 mg  Status:  Discontinued        500 mg 100 mL/hr over 60 Minutes Intravenous Every 12 hours 01/08/21 1059 01/09/21 1000   01/08/21 1400  ceFEPIme (MAXIPIME) 2 g in sodium chloride 0.9 % 100 mL IVPB  Status:  Discontinued        2 g 200 mL/hr over 30 Minutes Intravenous Every 24 hours 01/08/21 1114 01/09/21  [id number]   01/08/21 1000  vancomycin (VANCOREADY) IVPB 750 mg/150 mL  Status:  Discontinued        750 mg 150 mL/hr over 60 Minutes Intravenous Every 24 hours 01/07/21 2052 01/09/21 1000   01/08/21 0600  vancomycin (VANCOREADY) IVPB 1500 mg/300 mL  Status:  Discontinued         1,500 mg 150 mL/hr over 120 Minutes Intravenous Every 24 hours 01/07/21 0430 01/07/21 1210   01/08/21 0600  vancomycin (VANCOREADY) IVPB 750 mg/150 mL  Status:  Discontinued        750 mg 150 mL/hr over 60 Minutes Intravenous Every 24 hours 01/07/21 1210 01/07/21 2052   01/07/21 1400  piperacillin-tazobactam (ZOSYN) IVPB 3.375 g  Status:  Discontinued        3.375 g 12.5 mL/hr over 240 Minutes Intravenous Every 8 hours 01/07/21 0430 01/08/21 1114   01/07/21 0530  piperacillin-tazobactam (ZOSYN) IVPB 3.375 g        3.375 g 100 mL/hr over 30 Minutes Intravenous STAT 01/07/21 0430 01/07/21 0622   01/07/21 0530  vancomycin (VANCOREADY) IVPB 1750 mg/350 mL        1,750 mg 175 mL/hr over 120 Minutes Intravenous  Once 01/07/21 0430 01/07/21 0841   01/05/21 1430  Ampicillin-Sulbactam (UNASYN) 3 g in sodium chloride 0.9 % 100 mL IVPB        3 g 200 mL/hr over 30 Minutes Intravenous Every 6 hours 01/05/21 1334 01/06/21 1552   01/01/21 2200  piperacillin-tazobactam (ZOSYN) IVPB 3.375 g        3.375 g 12.5 mL/hr over 240 Minutes Intravenous Every 8 hours 01/01/21 1841 01/04/21 2359   01/01/21 2100  piperacillin-tazobactam (ZOSYN) IVPB 3.375 g  Status:  Discontinued        3.375 g 100 mL/hr over 30 Minutes Intravenous Every 8 hours 01/01/21 1836 01/01/21 1841   01/01/21 1345  piperacillin-tazobactam (ZOSYN) IVPB 3.375 g        3.375 g 100 mL/hr over 30 Minutes Intravenous  Once 01/01/21 1337 01/01/21 1430        MEDICATIONS: Scheduled Meds:  sodium chloride   Intravenous Once   sodium chloride   Intravenous Once   acetaminophen  1,000 mg Oral Q6H   carvedilol  12.5 mg Per Tube BID WC   Chlorhexidine Gluconate Cloth  6 each Topical Daily   feeding supplement  1 Container Oral BID BM   [START ON 01/14/2021] furosemide  40 mg Oral Daily   heparin injection (subcutaneous)  5,000 Units Subcutaneous Q8H   insulin aspart  0-20 Units Subcutaneous Q4H   insulin aspart  4 Units Subcutaneous BID    ipratropium-albuterol  3 mL Nebulization BID   mouth rinse  15 mL Mouth Rinse BID   methocarbamol  500 mg Oral QID   pantoprazole sodium  40 mg Oral Daily   sodium chloride flush  10-40 mL Intracatheter Q12H   sodium chloride flush  3 mL Intravenous Q12H   Continuous Infusions:  sodium chloride 10 mL/hr at 01/05/21 2306   sodium chloride Stopped (01/06/21 0324)   sodium chloride     TPN ADULT (ION) 100 mL/hr at 01/12/21 1722   TPN ADULT (ION)     PRN Meds:.sodium chloride, Place/Maintain arterial line **AND** sodium chloride, albuterol, dextromethorphan, HYDROmorphone (DILAUDID) injection, hydroxypropyl methylcellulose / hypromellose, ondansetron (ZOFRAN) IV, oxyCODONE, phenol, sodium chloride flush   I have personally reviewed following labs and imaging studies  LABORATORY DATA: CBC: Recent Labs  Lab 01/08/21 0446 01/09/21 0332  01/10/21 0219 01/11/21 0744 01/12/21 0750 01/13/21 0502  WBC 17.4* 22.7* 20.5* 21.1* 21.8* 19.4*  NEUTROABS 15.5* 20.7* 18.1*  --   --   --   HGB 8.3* 8.4* 8.1* 7.6* 7.9* 8.1*  HCT 24.4* 25.2* 25.2* 24.3* 25.1* 25.5*  MCV 89.7 91.0 94.4 96.8 97.3 96.6  PLT 248 297 344 391 468* 525*     Basic Metabolic Panel: Recent Labs  Lab 01/07/21 0639 01/08/21 0446 01/09/21 0332 01/10/21 0219 01/11/21 0419 01/12/21 0416 01/13/21 0502  NA 137   < > 142 146* 136 141 135  K 4.0   < > 3.6 3.8 3.7 4.2 4.3  CL 104   < > 111 114* 107 105 104  CO2 19*   < > '22 22 23 26 26  '$ GLUCOSE 178*   < > 152* 182* 188* 191* 238*  BUN 36*   < > 40* 26* 20 23 24*  CREATININE 3.01*   < > 1.84* 1.29* 0.98 1.09 0.93  CALCIUM 7.9*   < > 8.3* 8.7* 8.6* 8.9 8.9  MG 2.0  --  2.9* 2.3 1.9 1.7 2.1  PHOS 5.3*  --  3.1 2.3* 2.9 3.6  --    < > = values in this interval not displayed.     GFR: Estimated Creatinine Clearance: 86.9 mL/min (by C-G formula based on SCr of 0.93 mg/dL).  Liver Function Tests: Recent Labs  Lab 01/07/21 0849 01/08/21 0446 01/09/21 0332  01/10/21 0219 01/12/21 0416  AST '28 22 27 21 20  '$ ALT '18 17 17 17 16  '$ ALKPHOS 64 60 98 116 140*  BILITOT 2.6* 2.5* 2.0* 1.4* 0.8  PROT 4.9* 4.9* 5.2* 5.4* 5.5*  ALBUMIN 1.6* <1.5* <1.5* 1.5* 1.5*    No results for input(s): LIPASE, AMYLASE in the last 168 hours. No results for input(s): AMMONIA in the last 168 hours.  Coagulation Profile: No results for input(s): INR, PROTIME in the last 168 hours.   Cardiac Enzymes: No results for input(s): CKTOTAL, CKMB, CKMBINDEX, TROPONINI in the last 168 hours.  BNP (last 3 results) Recent Labs    06/02/20 1431  PROBNP 61.0     Lipid Profile: Recent Labs    01/11/21 0419  TRIG 203*     Thyroid Function Tests: No results for input(s): TSH, T4TOTAL, FREET4, T3FREE, THYROIDAB in the last 72 hours.  Anemia Panel: No results for input(s): VITAMINB12, FOLATE, FERRITIN, TIBC, IRON, RETICCTPCT in the last 72 hours.  Urine analysis:    Component Value Date/Time   COLORURINE AMBER (A) 01/01/2021 1338   APPEARANCEUR HAZY (A) 01/01/2021 1338   APPEARANCEUR Cloudy 11/19/2012 1107   LABSPEC >1.046 (H) 01/01/2021 1338   LABSPEC 1.024 11/19/2012 1107   PHURINE 5.0 01/01/2021 1338   GLUCOSEU 50 (A) 01/01/2021 1338   GLUCOSEU Negative 11/19/2012 1107   HGBUR SMALL (A) 01/01/2021 1338   BILIRUBINUR NEGATIVE 01/01/2021 1338   BILIRUBINUR Negative 11/19/2012 1107   KETONESUR NEGATIVE 01/01/2021 1338   PROTEINUR 30 (A) 01/01/2021 1338   UROBILINOGEN 0.2 09/14/2013 1225   NITRITE NEGATIVE 01/01/2021 1338   LEUKOCYTESUR NEGATIVE 01/01/2021 1338   LEUKOCYTESUR Negative 11/19/2012 1107    Sepsis Labs: Lactic Acid, Venous    Component Value Date/Time   LATICACIDVEN 1.9 01/06/2021 1128    MICROBIOLOGY: Recent Results (from the past 240 hour(s))  MRSA Next Gen by PCR, Nasal     Status: None   Collection Time: 01/07/21  8:38 AM   Specimen: Nasal Mucosa; Nasal Swab  Result Value Ref Range Status   MRSA by PCR Next Gen NOT DETECTED  NOT DETECTED Final    Comment: (NOTE) The GeneXpert MRSA Assay (FDA approved for NASAL specimens only), is one component of a comprehensive MRSA colonization surveillance program. It is not intended to diagnose MRSA infection nor to guide or monitor treatment for MRSA infections. Test performance is not FDA approved in patients less than 43 years old. Performed at [facility], [place] [address], [place], [place] [address]   Culture, blood (routine x 2)     Status: None (Preliminary result)   Collection Time: 01/09/21 10:19 AM   Specimen: BLOOD LEFT HAND  Result Value Ref Range Status   Specimen Description BLOOD LEFT HAND  Final   Special Requests   Final    BOTTLES DRAWN AEROBIC AND ANAEROBIC Blood Culture adequate volume   Culture   Final    NO GROWTH 4 DAYS Performed at [facility], [place] [address], [place], [place] [address]    Report Status PENDING  Incomplete  Culture, blood (routine x 2)     Status: None (Preliminary result)   Collection Time: 01/09/21 10:30 AM   Specimen: BLOOD LEFT HAND  Result Value Ref Range Status   Specimen Description BLOOD LEFT HAND  Final   Special Requests   Final    BOTTLES DRAWN AEROBIC ONLY Blood Culture results may not be optimal due to an inadequate volume of blood received in culture bottles   Culture   Final    NO GROWTH 4 DAYS Performed at [facility], [place] [address] [place], [place] [address]    Report Status PENDING  Incomplete    RADIOLOGY STUDIES/RESULTS: CT CHEST ABDOMEN PELVIS W CONTRAST  Result Date: 01/12/2021 CLINICAL DATA:  History of partial colon resection and splenectomy, chest pain, abdominal pain EXAM: CT CHEST, ABDOMEN, AND PELVIS WITH CONTRAST TECHNIQUE: Multidetector CT imaging of the chest, abdomen and pelvis was performed following the standard protocol during bolus administration of intravenous contrast. CONTRAST:  152m OMNIPAQUE IOHEXOL 300 MG/ML  SOLN COMPARISON:  01/06/2021, 01/01/2021  FINDINGS: CT CHEST FINDINGS Cardiovascular: The heart is unremarkable without pericardial effusion. No evidence of thoracic aortic aneurysm or dissection. Minimal atherosclerosis. There is a left subclavian central venous catheter tip within the superior vena cava. Mediastinum/Nodes: Borderline enlarged mediastinal lymph nodes measure up to 12 mm in the precarinal region, likely reactive. Thyroid, trachea, and esophagus are unremarkable. Lungs/Pleura: There is aim moderate left pleural effusion, volume estimated less than 1 L. Dense left lower lobe atelectasis is also noted. Dependent right lower lobe atelectasis is also seen. No pneumothorax. Central airways are patent. Musculoskeletal: There are no acute or destructive bony lesions. Reconstructed images demonstrate no additional findings. CT ABDOMEN PELVIS FINDINGS Hepatobiliary: Small calcified gallstones are again identified without evidence of cholecystitis. The liver is unremarkable. No intrahepatic biliary duct dilation. Pancreas: Unremarkable. No pancreatic ductal dilatation or surrounding inflammatory changes. 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 Residual hematoma within the splenectomy bed measures 4.9 x 7.8 cm. Adrenals/Urinary Tract: The kidneys enhance normally and symmetrically. No urinary tract calculi or obstructive uropathy. The adrenals and bladder are unremarkable. Stomach/Bowel: Postsurgical changes are seen from cecal resection with ileocolic anastomosis. No evidence of bowel obstruction or ileus. No bowel wall thickening or inflammatory change. Vascular/Lymphatic: Aortic atherosclerosis. No enlarged abdominal or pelvic lymph nodes. Reproductive: Prostate is unremarkable. Other: Postsurgical changes are seen from prior midline laparotomy. A surgical drain enters the abdomen in  the right lower quadrant, tip within the left hemipelvis. A small fluid collection in the left upper quadrant adjacent to the post splenectomy hematoma  measures 5.9 x 4.0 cm image 55/3, with a punctate focus of internal gas, likely representing postoperative seroma. This does not demonstrate any rim enhancement to suggest abscess. There is trace free fluid within the lower abdomen and pelvis. A small localized fluid collection in the right lower quadrant image 100/3 measures 2.4 x 1.9 cm, indeterminate at this time. This could reflect organizing phlegmon or developing abscess, and continued follow-up is recommended. Musculoskeletal: Stable right hip arthroplasty. There are no acute or destructive bony lesions. Reconstructed images demonstrate no additional findings. IMPRESSION: 1. 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 2. Indeterminate 2.4 cm fluid collection right lower quadrant adjacent to the cecal resection site. This could reflect organizing phlegmon, and continued follow-up is recommended. 3. Likely postoperative seroma and residual hematoma at the splenectomy surgical site. No evidence of abscess at this location. 4. Moderate left pleural effusion with underlying compressive left lower lobe atelectasis. 5. Cholelithiasis without cholecystitis. 6.  Aortic Atherosclerosis (ICD10-I70.0). Electronically Signed   By: Randa Ngo M.D.   On: 01/12/2021 15:49   DG CHEST PORT 1 VIEW  Result Date: 01/13/2021 CLINICAL DATA:  Respiratory distress EXAM: PORTABLE CHEST 1 VIEW COMPARISON:  01/10/2021 FINDINGS: Low lung volumes. Moderate left and small right pleural effusions. No frank interstitial edema. No pneumothorax. The heart is normal in size. Left subclavian venous catheter terminates in the mid SVC. Right shoulder arthroplasty. IMPRESSION: Moderate left and small right pleural effusions. No frank interstitial edema. Electronically Signed   By: Julian Hy M.D.   On: 01/13/2021 02:34     LOS: 12 days   Oren Binet, MD  [facility]    To contact the attending provider between 7A-7P or the covering provider during  after hours 7P-7A, please log into the web site [email or web address] and access using universal [place] password for that web site. If you do not have the password, please call the hospital operator.  01/13/2021, 1:16 PM

## 2021-01-13 NOTE — Progress Notes (Addendum)
Occupational Therapy Treatment Patient Details Name: Travis [name] MRN: [id number] DOB: 07-22-1955 Today's Date: 01/13/2021   History of present illness Pt is a 64 y/o male admitted 10/9 with complaints of R lower quadrant abdominal pain. Found with intra-abdominal sepsis with perforated appendix and abdominal abscess; s/p diagnostic laparoscopy, ileocecectomy, drainage of interaabdominal abscess 10/9.  Post op ileus. 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 Pt self extubated 10/16. PMHx: CHF, CKD, COPD, TIA, diabetes, HTN, gout, CAD.   OT comments  Patient with incremental progress toward patient focused goals.  Lethargic this date, able to sit at the edge of the bed for up to 5 min with varying assist from supervision to [place].  Able to participate with light grooming with setup and min A for balance.  Patient HR was sustained 126-128 with edge of bed sitting.  Sit to stand trials to increase independence with toileting deferred.  Returned to supine, and positioned for comfort, HR 106 at completion of treatment.  OT to conitnue efforts in the acute setting, but given the level of assist needed, SNF for post acute rehab is recommended.     Recommendations for follow up therapy are one component of a multi-disciplinary discharge planning process, led by the attending physician.  Recommendations may be updated based on patient status, additional functional criteria and insurance authorization.    Follow Up Recommendations  SNF;Supervision/Assistance - 24 hour    Equipment Recommendations       Recommendations for Other Services      Precautions / Restrictions Precautions Precautions: Fall Precaution Comments: abdominal incision, JP drain R, wound VAC Restrictions Weight Bearing Restrictions: No       Mobility Bed Mobility Overal bed mobility: Needs Assistance Bed Mobility: Rolling;Sidelying to Sit;Sit to Supine Rolling: Mod assist Sidelying to sit: Mod assist   Sit  to supine: Max assist     Patient Response: Flat affect  Transfers                 General transfer comment: unable to trial sit to stands this am - HR sustained in 126 - 128    Balance Overall balance assessment: Needs assistance Sitting-balance support: Feet supported;Bilateral upper extremity supported Sitting balance-Leahy Scale: Fair Sitting balance - Comments: leaning back on EOB                                   ADL either performed or assessed with clinical judgement   ADL Overall ADL's : Needs assistance/impaired     Grooming: Minimal assistance;Sitting Grooming Details (indicate cue type and reason): sitting EOB with Min A for balance.     Lower Body Bathing: Total assistance;Bed level                                                 Cognition Arousal/Alertness: Lethargic Behavior During Therapy: Flat affect Overall Cognitive Status: No family/caregiver present to determine baseline cognitive functioning                                 General Comments: one word answeres, eyes closing and patient falling asleep        Exercises     Shoulder Instructions  General Comments      Pertinent Vitals/ Pain       Pain Assessment: Faces Faces Pain Scale: Hurts even more Pain Location: stomach Pain Descriptors / Indicators: Discomfort;Guarding;Operative site guarding;Moaning Pain Intervention(s): Monitored during session                                                          Frequency  Min 2X/week        Progress Toward Goals  OT Goals(current goals can now be found in the care plan section)     Acute Rehab OT Goals Patient Stated Goal: none stated OT Goal Formulation: Patient unable to participate in goal setting Time For Goal Achievement: 01/25/21 Potential to Achieve Goals: Good  Plan      Co-evaluation                 AM-PAC OT "6 Clicks" Daily  Activity     Outcome Measure   Help from another person eating meals?: Total Help from another person taking care of personal grooming?: A Little Help from another person toileting, which includes using toliet, bedpan, or urinal?: Total Help from another person bathing (including washing, rinsing, drying)?: A Lot Help from another person to put on and taking off regular upper body clothing?: A Lot Help from another person to put on and taking off regular lower body clothing?: Total 6 Click Score: 10    End of Session Equipment Utilized During Treatment: Oxygen  OT Visit Diagnosis: Other abnormalities of gait and mobility (R26.89);Muscle weakness (generalized) (M62.81);Pain;Other symptoms and signs involving cognitive function   Activity Tolerance Patient limited by lethargy   Patient Left in bed;with call bell/phone within reach;with bed alarm set   Nurse Communication          Time: [id number] OT Time Calculation (min): 15 min  Charges: OT General Charges $OT Visit: 1 Visit OT Treatments $Self Care/Home Management : 8-22 mins  01/13/2021  RP, OTR/L  Acute Rehabilitation Services  Office:  [phone]   Metta Clines 01/13/2021, 10:44 AM

## 2021-01-13 NOTE — Consult Note (Signed)
[place] Nurse wound follow up Patient receiving care in [place], PA-C present for vac change.  Wound type: Midline abdominal surgical incision Pressure Injury POA: NA  Wound bed: pink granulation tissue. Friable. Bleeding when dressing removed.  Drainage (amount, consistency, odor) Serosanguinous in canister Periwound: Intact Dressing procedure/placement/frequency: 2 pieces of black foam removed. 2 pieces of black foam used to fill the wound bed which extends down to the pubic hair line. Medium size Kellie Simmering # [phone]) Drape applied and immediate suction obtained at 125 mmHg. Canister Kellie Simmering # [phone]). M/W/F vac change. WOC will follow.  Cathlean Marseilles [name], MSN, RN, [place], Lysle Pearl, [facility] Wound Treatment Associate Pager [phone]

## 2021-01-13 NOTE — Progress Notes (Signed)
Progress Note   Subjective: Took O2 off last night and apparently has some acute hypoxia.  Stabilized after replaced.  Had a large watery BM overnight per RN.  Hasn't been out of bed yet, except standing with PT yesterday.  Patient states he has some nausea today, but no emesis and drank some CLD yesterday with no issues.  States pain medicines do help his pain.  Objective: Vital signs in last 24 hours: Temp:  [97.7 F (36.5 C)-99.5 F (37.5 C)] 97.8 F (36.6 C) (10/21 0748) Pulse Rate:  [106-122] 111 (10/21 0748) Resp:  [19-32] 20 (10/21 0748) BP: (128-167)/(73-103) 142/78 (10/21 0748) SpO2:  [94 %-100 %] 97 % (10/21 0748) Weight:  [92 kg] 92 kg (10/21 0650) Last BM Date:  (Patient could not recall last BM date)  Intake/Output from previous day: 10/20 0701 - 10/21 0700 In: -  Out: 1125 [Urine:1125] Intake/Output this shift: No intake/output data recorded.  PE: Abd: soft, nondistended, appropriately TTP, normal BS Wound VAC changed today, picture below JP with SS output, but minimal      Lab Results:  Recent Labs    01/12/21 0750 01/13/21 0502  WBC 21.8* 19.4*  HGB 7.9* 8.1*  HCT 25.1* 25.5*  PLT 468* 525*   BMET Recent Labs    01/12/21 0416 01/13/21 0502  NA 141 135  K 4.2 4.3  CL 105 104  CO2 26 26  GLUCOSE 191* 238*  BUN 23 24*  CREATININE 1.09 0.93  CALCIUM 8.9 8.9   PT/INR No results for input(s): LABPROT, INR in the last 72 hours.  CMP     Component Value Date/Time   NA 135 01/13/2021 0502   NA 138 06/20/2020 1034   NA 141 04/20/2014 1019   K 4.3 01/13/2021 0502   K 3.7 04/20/2014 1019   CL 104 01/13/2021 0502   CL 106 04/20/2014 1019   CO2 26 01/13/2021 0502   CO2 30 04/20/2014 1019   GLUCOSE 238 (H) 01/13/2021 0502   GLUCOSE 102 (H) 04/20/2014 1019   BUN 24 (H) 01/13/2021 0502   BUN 18 06/20/2020 1034   BUN 14 04/20/2014 1019   CREATININE 0.93 01/13/2021 0502   CREATININE 1.18 04/20/2014 1019   CALCIUM 8.9 01/13/2021 0502    CALCIUM 9.3 04/20/2014 1019   PROT 5.5 (L) 01/12/2021 0416   PROT 6.5 06/18/2017 1425   PROT 7.4 04/20/2014 1019   ALBUMIN 1.5 (L) 01/12/2021 0416   ALBUMIN 4.0 06/18/2017 1425   ALBUMIN 3.4 04/20/2014 1019   AST 20 01/12/2021 0416   AST 21 04/20/2014 1019   ALT 16 01/12/2021 0416   ALT 20 04/20/2014 1019   ALKPHOS 140 (H) 01/12/2021 0416   ALKPHOS 97 04/20/2014 1019   BILITOT 0.8 01/12/2021 0416   BILITOT 0.4 06/18/2017 1425   BILITOT 0.6 04/20/2014 1019   GFRNONAA >60 01/13/2021 0502   GFRNONAA >60 04/20/2014 1019   GFRNONAA >60 11/19/2012 1107   GFRAA 53 (L) 01/13/2020 1410   GFRAA >60 04/20/2014 1019   GFRAA >60 11/19/2012 1107   Lipase     Component Value Date/Time   LIPASE 31 01/01/2021 1023   LIPASE 112 04/20/2014 1019       Studies/Results: CT CHEST ABDOMEN PELVIS W CONTRAST  Result Date: 01/12/2021 CLINICAL DATA:  History of partial colon resection and splenectomy, chest pain, abdominal pain EXAM: CT CHEST, ABDOMEN, AND PELVIS WITH CONTRAST TECHNIQUE: Multidetector CT imaging of the chest, abdomen and pelvis was performed following the standard  protocol during bolus administration of intravenous contrast. CONTRAST:  112m OMNIPAQUE IOHEXOL 300 MG/ML  SOLN COMPARISON:  01/06/2021, 01/01/2021 FINDINGS: CT CHEST FINDINGS Cardiovascular: The heart is unremarkable without pericardial effusion. No evidence of thoracic aortic aneurysm or dissection. Minimal atherosclerosis. There is a left subclavian central venous catheter tip within the superior vena cava. Mediastinum/Nodes: Borderline enlarged mediastinal lymph nodes measure up to 12 mm in the precarinal region, likely reactive. Thyroid, trachea, and esophagus are unremarkable. Lungs/Pleura: There is aim moderate left pleural effusion, volume estimated less than 1 L. Dense left lower lobe atelectasis is also noted. Dependent right lower lobe atelectasis is also seen. No pneumothorax. Central airways are patent.  Musculoskeletal: There are no acute or destructive bony lesions. Reconstructed images demonstrate no additional findings. CT ABDOMEN PELVIS FINDINGS Hepatobiliary: Small calcified gallstones are again identified without evidence of cholecystitis. The liver is unremarkable. No intrahepatic biliary duct dilation. Pancreas: Unremarkable. No pancreatic ductal dilatation or surrounding inflammatory changes. 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 Residual hematoma within the splenectomy bed measures 4.9 x 7.8 cm. Adrenals/Urinary Tract: The kidneys enhance normally and symmetrically. No urinary tract calculi or obstructive uropathy. The adrenals and bladder are unremarkable. Stomach/Bowel: Postsurgical changes are seen from cecal resection with ileocolic anastomosis. No evidence of bowel obstruction or ileus. No bowel wall thickening or inflammatory change. Vascular/Lymphatic: Aortic atherosclerosis. No enlarged abdominal or pelvic lymph nodes. Reproductive: Prostate is unremarkable. Other: Postsurgical changes are seen from prior midline laparotomy. A surgical drain enters the abdomen in the right lower quadrant, tip within the left hemipelvis. A small fluid collection in the left upper quadrant adjacent to the post splenectomy hematoma measures 5.9 x 4.0 cm image 55/3, with a punctate focus of internal gas, likely representing postoperative seroma. This does not demonstrate any rim enhancement to suggest abscess. There is trace free fluid within the lower abdomen and pelvis. A small localized fluid collection in the right lower quadrant image 100/3 measures 2.4 x 1.9 cm, indeterminate at this time. This could reflect organizing phlegmon or developing abscess, and continued follow-up is recommended. Musculoskeletal: Stable right hip arthroplasty. There are no acute or destructive bony lesions. Reconstructed images demonstrate no additional findings. IMPRESSION: 1. Postsurgical changes from cecal  resection and splenectomy. 2. Indeterminate 2.4 cm fluid collection right lower quadrant adjacent to the cecal resection site. This could reflect organizing phlegmon, and continued follow-up is recommended. 3. Likely postoperative seroma and residual hematoma at the splenectomy surgical site. No evidence of abscess at this location. 4. Moderate left pleural effusion with underlying compressive left lower lobe atelectasis. 5. Cholelithiasis without cholecystitis. 6.  Aortic Atherosclerosis (ICD10-I70.0). Electronically Signed   By: MRanda NgoM.D.   On: 01/12/2021 15:49   DG CHEST PORT 1 VIEW  Result Date: 01/13/2021 CLINICAL DATA:  Respiratory distress EXAM: PORTABLE CHEST 1 VIEW COMPARISON:  01/10/2021 FINDINGS: Low lung volumes. Moderate left and small right pleural effusions. No frank interstitial edema. No pneumothorax. The heart is normal in size. Left subclavian venous catheter terminates in the mid SVC. Right shoulder arthroplasty. IMPRESSION: Moderate left and small right pleural effusions. No frank interstitial edema. Electronically Signed   By: SJulian HyM.D.   On: 01/13/2021 02:34    Anti-infectives: Anti-infectives (From admission, onward)    Start     Dose/Rate Route Frequency Ordered Stop   01/09/21 1100  piperacillin-tazobactam (ZOSYN) IVPB 3.375 g  Status:  Discontinued        3.375 g 12.5 mL/hr over 240 Minutes Intravenous Every 8  hours 01/09/21 1000 01/12/21 0835   01/09/21 0830  ceFEPIme (MAXIPIME) 2 g in sodium chloride 0.9 % 100 mL IVPB  Status:  Discontinued        2 g 200 mL/hr over 30 Minutes Intravenous Every 12 hours 01/09/21 0737 01/09/21 1000   01/08/21 1400  metroNIDAZOLE (FLAGYL) IVPB 500 mg  Status:  Discontinued        500 mg 100 mL/hr over 60 Minutes Intravenous Every 12 hours 01/08/21 1059 01/09/21 1000   01/08/21 1400  ceFEPIme (MAXIPIME) 2 g in sodium chloride 0.9 % 100 mL IVPB  Status:  Discontinued        2 g 200 mL/hr over 30 Minutes  Intravenous Every 24 hours 01/08/21 1114 01/09/21 0737   01/08/21 1000  vancomycin (VANCOREADY) IVPB 750 mg/150 mL  Status:  Discontinued        750 mg 150 mL/hr over 60 Minutes Intravenous Every 24 hours 01/07/21 2052 01/09/21 1000   01/08/21 0600  vancomycin (VANCOREADY) IVPB 1500 mg/300 mL  Status:  Discontinued        1,500 mg 150 mL/hr over 120 Minutes Intravenous Every 24 hours 01/07/21 0430 01/07/21 1210   01/08/21 0600  vancomycin (VANCOREADY) IVPB 750 mg/150 mL  Status:  Discontinued        750 mg 150 mL/hr over 60 Minutes Intravenous Every 24 hours 01/07/21 1210 01/07/21 2052   01/07/21 1400  piperacillin-tazobactam (ZOSYN) IVPB 3.375 g  Status:  Discontinued        3.375 g 12.5 mL/hr over 240 Minutes Intravenous Every 8 hours 01/07/21 0430 01/08/21 1114   01/07/21 0530  piperacillin-tazobactam (ZOSYN) IVPB 3.375 g        3.375 g 100 mL/hr over 30 Minutes Intravenous STAT 01/07/21 0430 01/07/21 0622   01/07/21 0530  vancomycin (VANCOREADY) IVPB 1750 mg/350 mL        1,750 mg 175 mL/hr over 120 Minutes Intravenous  Once 01/07/21 0430 01/07/21 0841   01/05/21 1430  Ampicillin-Sulbactam (UNASYN) 3 g in sodium chloride 0.9 % 100 mL IVPB        3 g 200 mL/hr over 30 Minutes Intravenous Every 6 hours 01/05/21 1334 01/06/21 1552   01/01/21 2200  piperacillin-tazobactam (ZOSYN) IVPB 3.375 g        3.375 g 12.5 mL/hr over 240 Minutes Intravenous Every 8 hours 01/01/21 1841 01/04/21 2359   01/01/21 2100  piperacillin-tazobactam (ZOSYN) IVPB 3.375 g  Status:  Discontinued        3.375 g 100 mL/hr over 30 Minutes Intravenous Every 8 hours 01/01/21 1836 01/01/21 1841   01/01/21 1345  piperacillin-tazobactam (ZOSYN) IVPB 3.375 g        3.375 g 100 mL/hr over 30 Minutes Intravenous  Once 01/01/21 1337 01/01/21 1430        Assessment/Plan POD 12, s/p exploratory laparotomy, ileocecectomy, drainage of intraabdominal abscess 01/01/21 Dr. Grandville Silos for perforated appendicitis with  abscess - cont CLD today due to patient stated nausea, but bowels appear to be working, hopeful to [employer] - Cxs w/ pansensitive E.Coli, zosyn stopped 10/20 - Drain SS this AM, continue drain, but can likely DC soon after starting diet - VAC change M/W/F to midline - PT/OT rec SNF at discharge -adjusted pain meds for better pain control - CT scan with some hematoma in splenic bed and likely a seroma present.  No acute infectious processes identified.  L pleural effusion noted.  Defer to primary service.    POD 7 s/p ex  lap, splenectomy, application of incisional wound vac 10/14. Dr. Bobbye Morton for splenic laceration - hgb stable around 8-ish -will need vaccines prior to discharge   FEN: TNA, CLD VTE: SQH  ID: Zosyn 10/9>> 10/20 Dispo: progressive   Below per primary team: hemorrhagic shock 2/2 above s/p 6 u PRBC, 2 u FFP 10/14 Septic shock 2/2 perforated appendicitis  EtOH abuse Alcohol related cardiomyopathy T2DM Chronic anemia Hx of TIA HTN HLD Gout Hx of GIB CAD Tobacco abuse   LOS: 12 days    Henreitta Cea, [facility] Surgery 01/13/2021, 9:01 AM Please see Amion for pager number during day hours 7:00am-4:30pm

## 2021-01-13 NOTE — Progress Notes (Signed)
Peripherally Inserted Central Catheter Placement  The IV Nurse has discussed with the patient and/or persons authorized to consent for the patient, the purpose of this procedure and the potential benefits and risks involved with this procedure.  The benefits include less needle sticks, lab draws from the catheter, and the patient may be discharged home with the catheter. Risks include, but not limited to, infection, bleeding, blood clot (thrombus formation), and puncture of an artery; nerve damage and irregular heartbeat and possibility to perform a PICC exchange if needed/ordered by physician.  Alternatives to this procedure were also discussed.  Bard Power PICC patient education guide, fact sheet on infection prevention and patient information card has been provided to patient /or left at bedside.    PICC Placement Documentation  PICC Double Lumen 29/56/21 PICC Left Basilic 50 cm 1 cm (Active)  Indication for Insertion or Continuance of Line Prolonged intravenous therapies 01/13/21 1600  Exposed Catheter (cm) 1 cm 01/13/21 1600  Site Assessment Clean;Dry;Intact 01/13/21 1600  Lumen #1 Status Flushed;Blood return noted 01/13/21 1600  Lumen #2 Status Flushed;Blood return noted 01/13/21 1600  Dressing Type Transparent 01/13/21 1600  Dressing Status Clean;Dry;Intact 01/13/21 1600  Antimicrobial disc in place? Yes 01/13/21 1600  Dressing Change Due 01/20/21 01/13/21 1600       Jule Economy Horton 01/13/2021, 4:47 PM

## 2021-01-13 NOTE — Progress Notes (Signed)
This RN responded to patient's bed alarm.  Upon arriving into patient room, pt in respiratory distress, and had pulled off his oxygen.  This RN placed O2 back on patient.  Pt tachypneic at 35-45 breaths per minute.  Upon auscultation of lungs, crackles/course lung sounds to bilateral upper lungs and diminished and expiratory wheezes noted to bilateral lower lobes.  This RN notified charge RN, Elmyra Ricks as well as Mindy, RN with rapid response and Vonna Kotyk, RT as well as Dr. Myna Hidalgo.  Patient using accessory muscles as well.  Medicated per [facility] for breathing treatment as well as pain.  Now patient appearing more calm, relaxed and breathing less labored.  Chest XRay done, and results sent to MD.  Will continue to monitor patient closely at this time for any further changes.  Per Vonna Kotyk, RT, Bipap available should patient need it in the near future.

## 2021-01-14 DIAGNOSIS — Z515 Encounter for palliative care: Secondary | ICD-10-CM | POA: Diagnosis not present

## 2021-01-14 DIAGNOSIS — J439 Emphysema, unspecified: Secondary | ICD-10-CM | POA: Diagnosis not present

## 2021-01-14 DIAGNOSIS — I5022 Chronic systolic (congestive) heart failure: Secondary | ICD-10-CM | POA: Diagnosis not present

## 2021-01-14 DIAGNOSIS — N1831 Chronic kidney disease, stage 3a: Secondary | ICD-10-CM | POA: Diagnosis not present

## 2021-01-14 DIAGNOSIS — Z7189 Other specified counseling: Secondary | ICD-10-CM | POA: Diagnosis not present

## 2021-01-14 DIAGNOSIS — K3532 Acute appendicitis with perforation and localized peritonitis, without abscess: Secondary | ICD-10-CM | POA: Diagnosis not present

## 2021-01-14 LAB — BASIC METABOLIC PANEL
Anion gap: 8 (ref 5–15)
BUN: 30 mg/dL — ABNORMAL HIGH (ref 8–23)
CO2: 27 mmol/L (ref 22–32)
Calcium: 9 mg/dL (ref 8.9–10.3)
Chloride: 100 mmol/L (ref 98–111)
Creatinine, Ser: 1.09 mg/dL (ref 0.61–1.24)
GFR, Estimated: >60 mL/min (ref >60)
Glucose, Bld: 296 mg/dL — ABNORMAL HIGH (ref 70–99)
Potassium: 4.4 mmol/L (ref 3.5–5.1)
Sodium: 135 mmol/L (ref 135–145)

## 2021-01-14 LAB — CBC
HCT: 24.8 % — ABNORMAL LOW (ref 39.0–52.0)
Hemoglobin: 8 g/dL — ABNORMAL LOW (ref 13.0–17.0)
MCH: 30.9 pg (ref 26.0–34.0)
MCHC: 32.3 g/dL (ref 30.0–36.0)
MCV: 95.8 fL (ref 80.0–100.0)
Platelets: 542 10*3/uL — ABNORMAL HIGH (ref 150–400)
RBC: 2.59 MIL/uL — ABNORMAL LOW (ref 4.22–5.81)
RDW: 16 % — ABNORMAL HIGH (ref 11.5–15.5)
WBC: 15.7 10*3/uL — ABNORMAL HIGH (ref 4.0–10.5)
nRBC: 0.3 % — ABNORMAL HIGH (ref 0.0–0.2)

## 2021-01-14 LAB — CULTURE, BLOOD (ROUTINE X 2)
Culture: NO GROWTH
Culture: NO GROWTH
Special Requests: ADEQUATE

## 2021-01-14 LAB — GLUCOSE, CAPILLARY
Glucose-Capillary: 193 mg/dL — ABNORMAL HIGH (ref 70–99)
Glucose-Capillary: 208 mg/dL — ABNORMAL HIGH (ref 70–99)
Glucose-Capillary: 231 mg/dL — ABNORMAL HIGH (ref 70–99)
Glucose-Capillary: 231 mg/dL — ABNORMAL HIGH (ref 70–99)
Glucose-Capillary: 243 mg/dL — ABNORMAL HIGH (ref 70–99)
Glucose-Capillary: 265 mg/dL — ABNORMAL HIGH (ref 70–99)
Glucose-Capillary: 288 mg/dL — ABNORMAL HIGH (ref 70–99)

## 2021-01-14 LAB — PHOSPHORUS: Phosphorus: 3.8 mg/dL (ref 2.5–4.6)

## 2021-01-14 MED ORDER — INSULIN ASPART 100 UNIT/ML IJ SOLN: 5.0000 [IU] | INTRAMUSCULAR | Status: DC

## 2021-01-14 MED ORDER — OXYCODONE HCL ER 15 MG PO T12A
15.0000 mg | EXTENDED_RELEASE_TABLET | Freq: Two times a day (BID) | ORAL | Status: DC
Start: 2021-01-14 — End: 2021-01-15
  Administered 2021-01-14 – 2021-01-15 (×2): 15 mg via ORAL
  Filled 2021-01-14 (×2): qty 1

## 2021-01-14 MED ORDER — MIRTAZAPINE 15 MG PO TABS
7.5000 mg | ORAL_TABLET | Freq: Every day | ORAL | Status: DC
  Administered 2021-01-14 – 2021-01-27 (×13): 7.5 mg via ORAL
  Filled 2021-01-14 (×14): qty 1

## 2021-01-14 MED ORDER — INSULIN ASPART 100 UNIT/ML IJ SOLN
8.0000 [IU] | INTRAMUSCULAR | Status: AC
Start: 2021-01-14 — End: 2021-01-14
  Administered 2021-01-14 (×3): 8 [IU] via SUBCUTANEOUS

## 2021-01-14 MED ORDER — TRAVASOL 10 % IV SOLN
INTRAVENOUS | Status: AC
  Filled 2021-01-14: qty 1296

## 2021-01-14 NOTE — Progress Notes (Signed)
PROGRESS NOTE        PATIENT DETAILS Name: Travis [name] Age: 65 y.o. Sex: male Date of Birth: 1955/07/30 Admit Date: 01/01/2021 Admitting Physician Georganna Skeans, MD OIZ:TIWPY, Alyson Locket, NP  Brief Narrative: Patient is a 66 y.o. male with history of HFrEF, CKD stage III, COPD, HTN, DM-2, HLD, nonobstructive CAD-who presented with abdominal pain-found to have sepsis from ruptured appendicitis-underwent exploratory laparotomy/ileocecectomy and drainage of intra-abdominal abscess.Patient was briefly managed in the ICU-unfortunately-on 10/14 he developed severe abdominal pain -he was then found to have hemorrhagic shock due to a spontaneous splenic rupture-he was reevaluated by general surgery and underwent a splenectomy.  He was readmitted to the ICU-remained on the ventilator-he self extubated on 10/16-he was monitored closely-upon further stability-transfer to the [facility] on 10/20.  Significant events: 10/09>>admit- ex lap, remained intubated post op-to ICU 10/10>>extubated 10/12>> transferred to [facility] 10/14>>hypotensive/abd pain-CT showed spleenc rupture-to OR for splenectomy. 10/16>> Self-extubated  10/18>> Increased O2 requirement to 8L 10/19>> O2 weaned to 4L 10/20>> transferred to [facility]    Subjective: Breathing is much better.  He did not have any major events overnight.  Lying comfortably in bed.  Objective: Vitals: Blood pressure 129/78, pulse (!) 113, temperature 98.9 F (37.2 C), temperature source Oral, resp. rate 18, height 6' (1.829 m), weight 92 kg, SpO2 100 %.   Exam: Gen Exam:Alert awake-not in any distress HEENT:atraumatic, normocephalic Chest: B/L clear to auscultation anteriorly CVS:S1S2 regular Abdomen:soft non tender, non distended Extremities:no edema Neurology: Non focal Skin: no rash   Pertinent Labs/Radiology: CBC Latest Ref Rng & Units 01/14/2021 01/13/2021 01/12/2021  WBC 4.0 - 10.5 K/uL 15.7(H) 19.4(H)  21.8(H)  Hemoglobin 13.0 - 17.0 g/dL 8.0(L) 8.1(L) 7.9(L)  Hematocrit 39.0 - 52.0 % 24.8(L) 25.5(L) 25.1(L)  Platelets 150 - 400 K/uL 542(H) 525(H) 468(H)    BMP Latest Ref Rng & Units 01/14/2021 01/13/2021 01/12/2021  Glucose 70 - 99 mg/dL 296(H) 238(H) 191(H)  BUN 8 - 23 mg/dL 30(H) 24(H) 23  Creatinine 0.61 - 1.24 mg/dL 1.09 0.93 1.09  BUN/Creat Ratio 10 - 24 - - -  Sodium 135 - 145 mmol/L 135 135 141  Potassium 3.5 - 5.1 mmol/L 4.4 4.3 4.2  Chloride 98 - 111 mmol/L 100 104 105  CO2 22 - 32 mmol/L 27 26 26   Calcium 8.9 - 10.3 mg/dL 9.0 8.9 8.9    Microbiology: 10/09>> blood culture: 1/2 positive for bacteroides species. 10/09>> pelvic fluid/abscess: E. coli/Enterococcus Durans (pansensitive) 10/17>>Blood culture: No growth 10/10>>Urine Culture: No growth  Radiology/Echo 10/09>> CT abdomen: Acute appendicitis with perforation 10/14>> CT abdomen: Splenic rupture with large splenic hematoma 10/15>> TTE: EF 45-50% 10/20>> CT abdomen/pelvis: Indeterminate 2.4 cm fluid collection in the right lower quadrant adjacent to the cecal resection site.  Moderate left pleural effusion with compressive atelectasis.  Assessment/Plan:  Sepsis due to acute appendicitis with perforation/intra-abdominal abscesses and Bacteroides bacteremia (s/p laparotomy/ileocecectomy and drainage of intra-abdominal abscess on 10/9): Sepsis physiology has resolved-Zosyn discontinued on 10/20.  On clear liquid diet.  CT abdomen on 10/20 with a small indeterminate fluid collection but otherwise stable.  General surgery following and directing care.    Hemorrhagic shock due to spontaneous splenic rupture s/p splenectomy on 10/14: Continue to follow hemoglobin- plan on appropriate vaccinations 2 weeks from splenectomy.    Acute hypoxic respiratory failure due to atelectasis/pleural effusion: Self extubated on  10/16-stable on anywhere from 2-4 L of oxygen-did develop some transient shortness of breath on 10/20-which  responded to bronchodilators/diuretics.    Patient appears to have a moderate-sized left-sided pleural effusion which is likely transudative from-HFrEF/reactive to splenectomy.  His shortness of breath has improved-he is lying flat-plans are to continue with Lasix 40 mg IV twice daily, bronchodilators, mobilization, incentive spirometry etc.  We will monitor closely for further improvement, however if he redevelops issues with shortness of breath-we can then consider thoracocentesis.   Acute metabolic encephalopathy: Rapidly improving-continue supportive care.  AKI on CKD stage IIIa: AKI hemodynamically mediated-improving with supportive care.  HTN: BP stable-continue Coreg-losartan remains on hold  Nonischemic cardiomyopathy/HFrEF: Not in exacerbation-continue Lasix and monitor volume status closely.  Follow electrolytes.   Nonobstructive CAD: No anginal symptoms-already on beta-blocker-we will resume aspirin/statin over the next few days when more stable.  DM-2 (A1c 6.4 on 06/02/2020): CBGs stable-continue SSI  Recent Labs    01/14/21 0402 01/14/21 0723 01/14/21 1146  GLUCAP 265* 243* 231*      HLD: Resume statin/fenofibrate when oral intake was stable.  COPD: Stable-continue bronchodilators  Functional quadriplegia/debility/deconditioning: Due to acute illness-continue PT/OT eval-either SNF or LTAC on discharge.  Other issues: Has subclavian TLC since 10/14-this was removed on 10/21-he now has a PICC line through which TNA is infusing.  Nutrition Status: Nutrition Problem: Moderate Malnutrition Etiology: acute illness, chronic illness (perforated appendix, CAD, cardiomyopathy) Signs/Symptoms: mild muscle depletion, mild fat depletion Interventions: TPN   Obesity: Estimated body mass index is 27.51 kg/m as calculated from the following:   Height as of this encounter: 6' (1.829 m).   Weight as of this encounter: 92 kg.    Procedures: 10/9>>exploratory laparotomy,  ileocecectomy, drainage of intraabdominal abscess  10/14>> exploratory laparotomy and splenectomy 10/14>> left subclavian triple-lumen catheter  Consults: General surgery, PCCM DVT Prophylaxis: Heparin Code Status:DNR Family Communication: Sister-in-law at bedside on 10/20.  Not at bedside today.  Time spent: 35 minutes-Greater than 50% of this time was spent in counseling, explanation of diagnosis, planning of further management, and coordination of care.  Diet: Diet Order             Diet clear liquid Room service appropriate? Yes; Fluid consistency: Thin  Diet effective now                      Disposition Plan: Status is: Inpatient  Remains inpatient appropriate because: Requires inpatient level of care.   Barriers to Discharge: Perforated appendix with pelvic abscess/splenic rupture-s/p laparotomy/splenectomy-not yet stable for discharge.  Antimicrobial agents: Anti-infectives (From admission, onward)    Start     Dose/Rate Route Frequency Ordered Stop   01/09/21 1100  piperacillin-tazobactam (ZOSYN) IVPB 3.375 g  Status:  Discontinued        3.375 g 12.5 mL/hr over 240 Minutes Intravenous Every 8 hours 01/09/21 1000 01/12/21 0835   01/09/21 0830  ceFEPIme (MAXIPIME) 2 g in sodium chloride 0.9 % 100 mL IVPB  Status:  Discontinued        2 g 200 mL/hr over 30 Minutes Intravenous Every 12 hours 01/09/21 0737 01/09/21 1000   01/08/21 1400  metroNIDAZOLE (FLAGYL) IVPB 500 mg  Status:  Discontinued        500 mg 100 mL/hr over 60 Minutes Intravenous Every 12 hours 01/08/21 1059 01/09/21 1000   01/08/21 1400  ceFEPIme (MAXIPIME) 2 g in sodium chloride 0.9 % 100 mL IVPB  Status:  Discontinued  2 g 200 mL/hr over 30 Minutes Intravenous Every 24 hours 01/08/21 1114 01/09/21 0737   01/08/21 1000  vancomycin (VANCOREADY) IVPB 750 mg/150 mL  Status:  Discontinued        750 mg 150 mL/hr over 60 Minutes Intravenous Every 24 hours 01/07/21 2052 01/09/21 1000    01/08/21 0600  vancomycin (VANCOREADY) IVPB 1500 mg/300 mL  Status:  Discontinued        1,500 mg 150 mL/hr over 120 Minutes Intravenous Every 24 hours 01/07/21 0430 01/07/21 1210   01/08/21 0600  vancomycin (VANCOREADY) IVPB 750 mg/150 mL  Status:  Discontinued        750 mg 150 mL/hr over 60 Minutes Intravenous Every 24 hours 01/07/21 1210 01/07/21 2052   01/07/21 1400  piperacillin-tazobactam (ZOSYN) IVPB 3.375 g  Status:  Discontinued        3.375 g 12.5 mL/hr over 240 Minutes Intravenous Every 8 hours 01/07/21 0430 01/08/21 1114   01/07/21 0530  piperacillin-tazobactam (ZOSYN) IVPB 3.375 g        3.375 g 100 mL/hr over 30 Minutes Intravenous STAT 01/07/21 0430 01/07/21 0622   01/07/21 0530  vancomycin (VANCOREADY) IVPB 1750 mg/350 mL        1,750 mg 175 mL/hr over 120 Minutes Intravenous  Once 01/07/21 0430 01/07/21 0841   01/05/21 1430  Ampicillin-Sulbactam (UNASYN) 3 g in sodium chloride 0.9 % 100 mL IVPB        3 g 200 mL/hr over 30 Minutes Intravenous Every 6 hours 01/05/21 1334 01/06/21 1552   01/01/21 2200  piperacillin-tazobactam (ZOSYN) IVPB 3.375 g        3.375 g 12.5 mL/hr over 240 Minutes Intravenous Every 8 hours 01/01/21 1841 01/04/21 2359   01/01/21 2100  piperacillin-tazobactam (ZOSYN) IVPB 3.375 g  Status:  Discontinued        3.375 g 100 mL/hr over 30 Minutes Intravenous Every 8 hours 01/01/21 1836 01/01/21 1841   01/01/21 1345  piperacillin-tazobactam (ZOSYN) IVPB 3.375 g        3.375 g 100 mL/hr over 30 Minutes Intravenous  Once 01/01/21 1337 01/01/21 1430        MEDICATIONS: Scheduled Meds:  sodium chloride   Intravenous Once   sodium chloride   Intravenous Once   acetaminophen  1,000 mg Oral Q6H   carvedilol  12.5 mg Per Tube BID WC   Chlorhexidine Gluconate Cloth  6 each Topical Daily   feeding supplement  1 Container Oral BID BM   furosemide  40 mg Intravenous BID   heparin injection (subcutaneous)  5,000 Units Subcutaneous Q8H   insulin aspart   0-20 Units Subcutaneous Q4H   insulin aspart  8 Units Subcutaneous Q4H   ipratropium-albuterol  3 mL Nebulization BID   mouth rinse  15 mL Mouth Rinse BID   methocarbamol  500 mg Oral QID   pantoprazole sodium  40 mg Oral Daily   sodium chloride flush  10-40 mL Intracatheter Q12H   sodium chloride flush  3 mL Intravenous Q12H   Continuous Infusions:  sodium chloride 10 mL/hr at 01/05/21 2306   sodium chloride Stopped (01/06/21 0324)   sodium chloride     TPN ADULT (ION) 100 mL/hr at 01/13/21 1721   TPN ADULT (ION)     PRN Meds:.sodium chloride, Place/Maintain arterial line **AND** sodium chloride, albuterol, dextromethorphan, HYDROmorphone (DILAUDID) injection, hydroxypropyl methylcellulose / hypromellose, ondansetron (ZOFRAN) IV, oxyCODONE, phenol, sodium chloride flush   I have personally reviewed following labs and imaging studies  LABORATORY DATA: CBC: Recent Labs  Lab 01/08/21 0446 01/09/21 0332 01/10/21 0219 01/11/21 0744 01/12/21 0750 01/13/21 0502 01/14/21 0341  WBC 17.4* 22.7* 20.5* 21.1* 21.8* 19.4* 15.7*  NEUTROABS 15.5* 20.7* 18.1*  --   --   --   --   HGB 8.3* 8.4* 8.1* 7.6* 7.9* 8.1* 8.0*  HCT 24.4* 25.2* 25.2* 24.3* 25.1* 25.5* 24.8*  MCV 89.7 91.0 94.4 96.8 97.3 96.6 95.8  PLT 248 297 344 391 468* 525* 542*     Basic Metabolic Panel: Recent Labs  Lab 01/09/21 0332 01/10/21 0219 01/11/21 0419 01/12/21 0416 01/13/21 0502 01/14/21 0341  NA 142 146* 136 141 135 135  K 3.6 3.8 3.7 4.2 4.3 4.4  CL 111 114* 107 105 104 100  CO2 22 22 23 26 26 27   GLUCOSE 152* 182* 188* 191* 238* 296*  BUN 40* 26* 20 23 24* 30*  CREATININE 1.84* 1.29* 0.98 1.09 0.93 1.09  CALCIUM 8.3* 8.7* 8.6* 8.9 8.9 9.0  MG 2.9* 2.3 1.9 1.7 2.1  --   PHOS 3.1 2.3* 2.9 3.6  --  3.8     GFR: Estimated Creatinine Clearance: 74.2 mL/min (by C-G formula based on SCr of 1.09 mg/dL).  Liver Function Tests: Recent Labs  Lab 01/08/21 0446 01/09/21 0332 01/10/21 0219  01/12/21 0416  AST 22 27 21 20   ALT 17 17 17 16   ALKPHOS 60 98 116 140*  BILITOT 2.5* 2.0* 1.4* 0.8  PROT 4.9* 5.2* 5.4* 5.5*  ALBUMIN <1.5* <1.5* 1.5* 1.5*    No results for input(s): LIPASE, AMYLASE in the last 168 hours. No results for input(s): AMMONIA in the last 168 hours.  Coagulation Profile: No results for input(s): INR, PROTIME in the last 168 hours.   Cardiac Enzymes: No results for input(s): CKTOTAL, CKMB, CKMBINDEX, TROPONINI in the last 168 hours.  BNP (last 3 results) Recent Labs    06/02/20 1431  PROBNP 61.0     Lipid Profile: No results for input(s): CHOL, HDL, LDLCALC, TRIG, CHOLHDL, LDLDIRECT in the last 72 hours.   Thyroid Function Tests: No results for input(s): TSH, T4TOTAL, FREET4, T3FREE, THYROIDAB in the last 72 hours.  Anemia Panel: No results for input(s): VITAMINB12, FOLATE, FERRITIN, TIBC, IRON, RETICCTPCT in the last 72 hours.  Urine analysis:    Component Value Date/Time   COLORURINE AMBER (A) 01/01/2021 1338   APPEARANCEUR HAZY (A) 01/01/2021 1338   APPEARANCEUR Cloudy 11/19/2012 1107   LABSPEC >1.046 (H) 01/01/2021 1338   LABSPEC 1.024 11/19/2012 1107   PHURINE 5.0 01/01/2021 1338   GLUCOSEU 50 (A) 01/01/2021 1338   GLUCOSEU Negative 11/19/2012 1107   HGBUR SMALL (A) 01/01/2021 1338   BILIRUBINUR NEGATIVE 01/01/2021 1338   BILIRUBINUR Negative 11/19/2012 1107   KETONESUR NEGATIVE 01/01/2021 1338   PROTEINUR 30 (A) 01/01/2021 1338   UROBILINOGEN 0.2 09/14/2013 1225   NITRITE NEGATIVE 01/01/2021 1338   LEUKOCYTESUR NEGATIVE 01/01/2021 1338   LEUKOCYTESUR Negative 11/19/2012 1107    Sepsis Labs: Lactic Acid, Venous    Component Value Date/Time   LATICACIDVEN 1.9 01/06/2021 1128    MICROBIOLOGY: Recent Results (from the past 240 hour(s))  MRSA Next Gen by PCR, Nasal     Status: None   Collection Time: 01/07/21  8:38 AM   Specimen: Nasal Mucosa; Nasal Swab  Result Value Ref Range Status   MRSA by PCR Next Gen NOT  DETECTED NOT DETECTED Final    Comment: (NOTE) The GeneXpert MRSA Assay (FDA approved for NASAL specimens only),  is one component of a comprehensive MRSA colonization surveillance program. It is not intended to diagnose MRSA infection nor to guide or monitor treatment for MRSA infections. Test performance is not FDA approved in patients less than 18 years old. Performed at [facility], [place] [address], [place], [place] [address]   Culture, blood (routine x 2)     Status: None   Collection Time: 01/09/21 10:19 AM   Specimen: BLOOD LEFT HAND  Result Value Ref Range Status   Specimen Description BLOOD LEFT HAND  Final   Special Requests   Final    BOTTLES DRAWN AEROBIC AND ANAEROBIC Blood Culture adequate volume   Culture   Final    NO GROWTH 5 DAYS Performed at [facility], [place] [address] [place], [place] [address]    Report Status 01/14/2021 FINAL  Final  Culture, blood (routine x 2)     Status: None   Collection Time: 01/09/21 10:30 AM   Specimen: BLOOD LEFT HAND  Result Value Ref Range Status   Specimen Description BLOOD LEFT HAND  Final   Special Requests   Final    BOTTLES DRAWN AEROBIC ONLY Blood Culture results may not be optimal due to an inadequate volume of blood received in culture bottles   Culture   Final    NO GROWTH 5 DAYS Performed at [facility], [place] [address], [place],  [address]    Report Status 01/14/2021 FINAL  Final    RADIOLOGY STUDIES/RESULTS: DG CHEST PORT 1 VIEW  Result Date: 01/13/2021 CLINICAL DATA:  Respiratory distress EXAM: PORTABLE CHEST 1 VIEW COMPARISON:  01/10/2021 FINDINGS: Low lung volumes. Moderate left and small right pleural effusions. No frank interstitial edema. No pneumothorax. The heart is normal in size. Left subclavian venous catheter terminates in the mid SVC. Right shoulder arthroplasty. IMPRESSION: Moderate left and small right pleural effusions. No frank interstitial edema. Electronically Signed   By:  Julian Hy M.D.   On: 01/13/2021 02:34   [place] EKG SITE RITE  Result Date: 01/13/2021 If Site Rite image not attached, placement could not be confirmed due to current cardiac rhythm.    LOS: 13 days   Oren Binet, MD  [facility]    To contact the attending provider between 7A-7P or the covering provider during after hours 7P-7A, please log into the web site [email or web address] and access using universal [place] password for that web site. If you do not have the password, please call the hospital operator.  01/14/2021, 3:01 PM

## 2021-01-14 NOTE — Progress Notes (Signed)
Palliative Medicine Inpatient Follow Up Note  Consulting Provider: Delilah Shan, RN   Reason for consult:   [place] Palliative Medicine Consult  Reason for Consult? Pt is needing increasing pressor/vent support, 2 major abdominal surgeries. RN and MD have spoken with family multiple times about comfort care but family is still having some trouble processing everything. Thank you!    HPI:  Per intake H&P --> [name]. Travis [name] is a 65 year old gentleman with an extended PMH that includes but not limited to CHF, CKD 3A, COPD, CRA O, TIA, diabetes, hypertension, gout, hyperlipidemia, CAD (nonobstructive). He presented to the ED on 01/01/21 with complaints of right lower quadrant abdominal pain --> taken to the operating room where he was found to have a perforated appendix and abdominal abscess. 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 Now with rapidly worsening shock. Palliative care was asked to get involved to further address goals of care in the setting of overall critical illness and ongoing declining state.   Today's Discussion (01/14/2021):  *Please note that this is a verbal dictation therefore any spelling or grammatical errors are due to the "[place]" system interpretation.  Chart reviewed.   I met with Travis [name] and his niece, Travis [name] at bedside.   I introduced Palliative Medicine as specialized medical care for people living with serious illness. It focuses on providing relief from the symptoms and stress of a serious illness. The goal is to improve quality of life for both the patient and the family.  We reviewed that since last week Travis [name] has had some improvements as he has been extubated and transitioned to the medical floor. We reviewed that his lab values are slowing improving.  From a symptom perspective, Travis [name] remains to have incremental pain. He overall feels down about his situation in the setting  of loss of functional independence. From an appetite perspective, Travis [name] shares that he really has no desire at this time to eat much of anything. We reviewed the difficulty he has endured during his hospital stay in regards   We discuss the importance now that Travis [name] is alert and oriented for advance care planning. I introduced advance directives and the MOST form and reviewed these at bedside. We discussed that he should take some time to review these with his family prior to making any decisions.   Travis [name] shares that he would like to speak to some of his other nieces and nephews in regards to what his future will be. He expresses that he does feel at this point that "if it's over" to "let me be comfortable." We discussed that it's his life and the importance of knowing his wishes are invaluable to determine future care.   We plan to reconvene tomorrow to discuss his family conversations. In the meanwhile will continue to optimize symptom control.  Objective Assessment: Vital Signs Vitals:   01/14/21 0719 01/14/21 1144  BP: 135/71 129/78  Pulse: (!) 116 (!) 113  Resp: 20 18  Temp: 98.9 F (37.2 C) 98.9 F (37.2 C)  SpO2: 99% 100%    Intake/Output Summary (Last 24 hours) at 01/14/2021 1641 Last data filed at 01/14/2021 1100 Gross per 24 hour  Intake 1305.1 ml  Output 2930 ml  Net -1624.9 ml    Last Weight  Most recent update: 01/13/2021  6:50 AM    Weight  92 kg (202 lb 13.2 oz)  Gen: Very ill appearing AA M HEENT: Dry mucous membranes CV: Irregular rate and rhythm  PULM: On 4LPM [place] ABD: (+) abdominal dressings EXT: No edema  Neuro: Alert and oriented, able to converse  SUMMARY OF RECOMMENDATIONS   DNAR   Continue current level of care - Patient goals are for continued optimization    Patient is a member of [employer], will request chaplain support   Palliative care will  remain involved to aid in symptoms support  Symptoms: Abdominal Pain: - Add  Oxycontin 32m PO BID - Continue oxycodone 5-119mPO Q4H PRN - Continue Dilaudid  Situation Depression: - Remeron 7.71m38mO QHS  Lack of Appetite: - Remeron as above - Diet consult  Generalized Weakness: - PT/OT  Time Spent: 60 Greater than 50% of the time was spent in counseling and coordination of care _________________________________________________________________________________ [facility] Team Cell Phone: [phone] utilize secure chat with additional questions, if there is no response within 30 minutes please call the above phone number  Palliative Medicine Team providers are available by phone from 7am to 7pm daily and can be reached through the team cell phone.  Should this patient require assistance outside of these hours, please call the patient's attending physician.

## 2021-01-14 NOTE — Progress Notes (Signed)
PHARMACY - TOTAL PARENTERAL NUTRITION CONSULT NOTE   Indication: Prolonged ileus  Patient Measurements: Height: 6' (182.9 cm) Weight: 92 kg (202 lb 13.2 oz) IBW/kg (Calculated) : 77.6   Body mass index is 27.51 kg/m.  Assessment: 66 yom presenting 10/9 with acute perforated appendix, and abdominal abscess s/p OR for ex-lap, ilececectomy, and drainage of abscess on 10/9. Also had splenic laceration, s/p ex-lap, splenectomy, and wound vac application 38/93. Transferred to ICU on 10/12 with septic vs. hemorrhagic shock, intubated and on vasopressor support. Plan was initially to transition to comfort measures 10/14; however improved overnight with decreasing vasopressor requirement. Post-op ileus. Pharmacy consulted to start TPN.  Noted - BM documented since TPN consult placed (01/12/21). Per discussion with Surgery, will be at least 48 hrs until tube feeds started and likely longer until at goal. TPN will still be started today with patient already at least 8 days without nutrition. Current diet order is NPO   Glucose / Insulin: hx DM2 (no meds documented PTA). CBGs/12h 240-280 since new TPN bag hung with incr to 60 units of insulin R. SSI/12h: 29 units - will increase insulinr R in TPN bag again today and add additional coverage until new TPN bag hung  Electrolytes: Na 135, K 4.4 (goal >/=4 with ileus), Cl/HCO3 wnl,, CoCa 10.9 (Ca removed on 10/17), Phos 3.8, Mg 2.1 (10/21) Renal: AKI on CKD improving and now close to baseline. SCr up 1.09 << 0.93, BUN up 30 Hepatic: LFTs wnl. TG 203 (10/19) - will watch. Tbili down to 0.8, albumin 1.5 Intake / Output; MIVF: UOP okay, NGT removed 10/20 JP drain output 30 cc,  LBM 10/21 GI Imaging:  10/21 CXR: 2.4 cc fluid collection in RLQ near cecal resection site, post-op seroma/hematoma at splenectomy site  GI Surgeries / Procedures: none since TPN start  Central access: CVC triple lumen 10/14 >> 10/21; PICC 10/21 >> TPN start date:  01/08/21  Nutritional Goals: Goal TPN rate is 100 mL/hr (provides 130 g of protein and 2421 kcals per day)  RD Assessment: Estimated Needs Total Energy Estimated Needs: 2400-2600 Total Protein Estimated Needs: 125-150 grams Total Fluid Estimated Needs: > 2 L  Current Nutrition:  DC NGT, CLD - no advancement 10/21 d/t nausea. Per discussion w/ RN 10/22 still with poor intake of CLD d/t abd pain Boost breeze po bid (250 Kcal and 9 grams protein) - 2 charted as given 10/21  Plan:  Contiue TPN at goal rate of 100 ml/hr Electrolytes in TPN: incr Na to 40 meq, Mg 5 mEq/L. K 50 mEq/L, continue Ca 0 mEq/L, Phos 15 mmol/L. Adjust Cl:Ac to 1:2  Add standard MVI and trace elements to TPN + folic acid and thiamine with hx ETOH abuse Continue resistant SSI q4h, add novolog 8 units q4h x 3 until new TPN bag hung this evening Increase insulin R in TPN bag to 90 units with new bag hung this evening Monitor TPN labs on Mon/Thurs F/u return of bowel function, ability to initiate tube feeds and wean off TPN  Thank you for allowing pharmacy to be a part of this patient's care.  Alycia Rossetti, PharmD, BCPS Clinical Pharmacist Clinical phone for 01/14/2021: [phone] 01/14/2021 7:20 AM   **Pharmacist phone directory can now be found on [email or web address] ([email or web address]).  Listed under [place].

## 2021-01-14 NOTE — Progress Notes (Signed)
8 Days Post-Op   Subjective/Chief Complaint: No complaints. Seems a little sob   Objective: Vital signs in last 24 hours: Temp:  [98 F (36.7 C)-99.1 F (37.3 C)] 98.9 F (37.2 C) (10/22 0719) Pulse Rate:  [113-116] 116 (10/22 0719) Resp:  [19-24] 20 (10/22 0719) BP: (112-150)/(67-93) 135/71 (10/22 0719) SpO2:  [97 %-99 %] 99 % (10/22 0719) Last BM Date: 01/13/21  Intake/Output from previous day: 10/21 0701 - 10/22 0700 In: 1305.1 [I.V.:1305.1] Out: 1380 [Urine:1350; Drains:30] Intake/Output this shift: Total I/O In: -  Out: 1250 [Urine:1250]  General appearance: alert and cooperative Resp: clear to auscultation bilaterally and increased wob Cardio: regular rate and rhythm GI: soft, moderate tenderness. Vac in place. Good bs  Lab Results:  Recent Labs    01/13/21 0502 01/14/21 0341  WBC 19.4* 15.7*  HGB 8.1* 8.0*  HCT 25.5* 24.8*  PLT 525* 542*   BMET Recent Labs    01/13/21 0502 01/14/21 0341  NA 135 135  K 4.3 4.4  CL 104 100  CO2 26 27  GLUCOSE 238* 296*  BUN 24* 30*  CREATININE 0.93 1.09  CALCIUM 8.9 9.0   PT/INR No results for input(s): LABPROT, INR in the last 72 hours. ABG No results for input(s): PHART, HCO3 in the last 72 hours.  Invalid input(s): PCO2, PO2  Studies/Results: CT CHEST ABDOMEN PELVIS W CONTRAST  Result Date: 01/12/2021 CLINICAL DATA:  History of partial colon resection and splenectomy, chest pain, abdominal pain EXAM: CT CHEST, ABDOMEN, AND PELVIS WITH CONTRAST TECHNIQUE: Multidetector CT imaging of the chest, abdomen and pelvis was performed following the standard protocol during bolus administration of intravenous contrast. CONTRAST:  194mL OMNIPAQUE IOHEXOL 300 MG/ML  SOLN COMPARISON:  01/06/2021, 01/01/2021 FINDINGS: CT CHEST FINDINGS Cardiovascular: The heart is unremarkable without pericardial effusion. No evidence of thoracic aortic aneurysm or dissection. Minimal atherosclerosis. There is a left subclavian central  venous catheter tip within the superior vena cava. Mediastinum/Nodes: Borderline enlarged mediastinal lymph nodes measure up to 12 mm in the precarinal region, likely reactive. Thyroid, trachea, and esophagus are unremarkable. Lungs/Pleura: There is aim moderate left pleural effusion, volume estimated less than 1 L. Dense left lower lobe atelectasis is also noted. Dependent right lower lobe atelectasis is also seen. No pneumothorax. Central airways are patent. Musculoskeletal: There are no acute or destructive bony lesions. Reconstructed images demonstrate no additional findings. CT ABDOMEN PELVIS FINDINGS Hepatobiliary: Small calcified gallstones are again identified without evidence of cholecystitis. The liver is unremarkable. No intrahepatic biliary duct dilation. Pancreas: Unremarkable. No pancreatic ductal dilatation or surrounding inflammatory changes. 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 Residual hematoma within the splenectomy bed measures 4.9 x 7.8 cm. Adrenals/Urinary Tract: The kidneys enhance normally and symmetrically. No urinary tract calculi or obstructive uropathy. The adrenals and bladder are unremarkable. Stomach/Bowel: Postsurgical changes are seen from cecal resection with ileocolic anastomosis. No evidence of bowel obstruction or ileus. No bowel wall thickening or inflammatory change. Vascular/Lymphatic: Aortic atherosclerosis. No enlarged abdominal or pelvic lymph nodes. Reproductive: Prostate is unremarkable. Other: Postsurgical changes are seen from prior midline laparotomy. A surgical drain enters the abdomen in the right lower quadrant, tip within the left hemipelvis. A small fluid collection in the left upper quadrant adjacent to the post splenectomy hematoma measures 5.9 x 4.0 cm image 55/3, with a punctate focus of internal gas, likely representing postoperative seroma. This does not demonstrate any rim enhancement to suggest abscess. There is trace free fluid within  the lower abdomen and pelvis. A  small localized fluid collection in the right lower quadrant image 100/3 measures 2.4 x 1.9 cm, indeterminate at this time. This could reflect organizing phlegmon or developing abscess, and continued follow-up is recommended. Musculoskeletal: Stable right hip arthroplasty. There are no acute or destructive bony lesions. Reconstructed images demonstrate no additional findings. IMPRESSION: 1. 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 2. Indeterminate 2.4 cm fluid collection right lower quadrant adjacent to the cecal resection site. This could reflect organizing phlegmon, and continued follow-up is recommended. 3. Likely postoperative seroma and residual hematoma at the splenectomy surgical site. No evidence of abscess at this location. 4. Moderate left pleural effusion with underlying compressive left lower lobe atelectasis. 5. Cholelithiasis without cholecystitis. 6.  Aortic Atherosclerosis (ICD10-I70.0). Electronically Signed   By: Randa Ngo M.D.   On: 01/12/2021 15:49   DG CHEST PORT 1 VIEW  Result Date: 01/13/2021 CLINICAL DATA:  Respiratory distress EXAM: PORTABLE CHEST 1 VIEW COMPARISON:  01/10/2021 FINDINGS: Low lung volumes. Moderate left and small right pleural effusions. No frank interstitial edema. No pneumothorax. The heart is normal in size. Left subclavian venous catheter terminates in the mid SVC. Right shoulder arthroplasty. IMPRESSION: Moderate left and small right pleural effusions. No frank interstitial edema. Electronically Signed   By: Julian Hy M.D.   On: 01/13/2021 02:34   [place] EKG SITE RITE  Result Date: 01/13/2021 If Site Rite image not attached, placement could not be confirmed due to current cardiac rhythm.   Anti-infectives: Anti-infectives (From admission, onward)    Start     Dose/Rate Route Frequency Ordered Stop   01/09/21 1100  piperacillin-tazobactam (ZOSYN) IVPB 3.375 g  Status:  Discontinued        3.375  g 12.5 mL/hr over 240 Minutes Intravenous Every 8 hours 01/09/21 1000 01/12/21 0835   01/09/21 0830  ceFEPIme (MAXIPIME) 2 g in sodium chloride 0.9 % 100 mL IVPB  Status:  Discontinued        2 g 200 mL/hr over 30 Minutes Intravenous Every 12 hours 01/09/21 0737 01/09/21 1000   01/08/21 1400  metroNIDAZOLE (FLAGYL) IVPB 500 mg  Status:  Discontinued        500 mg 100 mL/hr over 60 Minutes Intravenous Every 12 hours 01/08/21 1059 01/09/21 1000   01/08/21 1400  ceFEPIme (MAXIPIME) 2 g in sodium chloride 0.9 % 100 mL IVPB  Status:  Discontinued        2 g 200 mL/hr over 30 Minutes Intravenous Every 24 hours 01/08/21 1114 01/09/21 0737   01/08/21 1000  vancomycin (VANCOREADY) IVPB 750 mg/150 mL  Status:  Discontinued        750 mg 150 mL/hr over 60 Minutes Intravenous Every 24 hours 01/07/21 2052 01/09/21 1000   01/08/21 0600  vancomycin (VANCOREADY) IVPB 1500 mg/300 mL  Status:  Discontinued        1,500 mg 150 mL/hr over 120 Minutes Intravenous Every 24 hours 01/07/21 0430 01/07/21 1210   01/08/21 0600  vancomycin (VANCOREADY) IVPB 750 mg/150 mL  Status:  Discontinued        750 mg 150 mL/hr over 60 Minutes Intravenous Every 24 hours 01/07/21 1210 01/07/21 2052   01/07/21 1400  piperacillin-tazobactam (ZOSYN) IVPB 3.375 g  Status:  Discontinued        3.375 g 12.5 mL/hr over 240 Minutes Intravenous Every 8 hours 01/07/21 0430 01/08/21 1114   01/07/21 0530  piperacillin-tazobactam (ZOSYN) IVPB 3.375 g        3.375 g 100 mL/hr over 30  Minutes Intravenous STAT 01/07/21 0430 01/07/21 0622   01/07/21 0530  vancomycin (VANCOREADY) IVPB 1750 mg/350 mL        1,750 mg 175 mL/hr over 120 Minutes Intravenous  Once 01/07/21 0430 01/07/21 0841   01/05/21 1430  Ampicillin-Sulbactam (UNASYN) 3 g in sodium chloride 0.9 % 100 mL IVPB        3 g 200 mL/hr over 30 Minutes Intravenous Every 6 hours 01/05/21 1334 01/06/21 1552   01/01/21 2200  piperacillin-tazobactam (ZOSYN) IVPB 3.375 g        3.375  g 12.5 mL/hr over 240 Minutes Intravenous Every 8 hours 01/01/21 1841 01/04/21 2359   01/01/21 2100  piperacillin-tazobactam (ZOSYN) IVPB 3.375 g  Status:  Discontinued        3.375 g 100 mL/hr over 30 Minutes Intravenous Every 8 hours 01/01/21 1836 01/01/21 1841   01/01/21 1345  piperacillin-tazobactam (ZOSYN) IVPB 3.375 g        3.375 g 100 mL/hr over 30 Minutes Intravenous  Once 01/01/21 1337 01/01/21 1430       Assessment/Plan: s/p Procedure(s): SPLENECTOMY (N/A) Advance diet. Pt tolerating clears but does not want to advance yet. Will his breathing that is probably wise POD 13, s/p exploratory laparotomy, ileocecectomy, drainage of intraabdominal abscess 01/01/21 Dr. Grandville Silos for perforated appendicitis with abscess - cont CLD today due to patient stated nausea, but bowels appear to be working, hopeful to [employer] - Cxs w/ pansensitive E.Coli, zosyn stopped 10/20 - Drain SS this AM, continue drain, but can likely DC soon after starting diet - VAC change M/W/F to midline - PT/OT rec SNF at discharge -adjusted pain meds for better pain control - CT scan with some hematoma in splenic bed and likely a seroma present.  No acute infectious processes identified.  L pleural effusion noted.  Defer to primary service.     POD 8 s/p ex lap, splenectomy, application of incisional wound vac 10/14. Dr. Bobbye Morton for splenic laceration - hgb stable around 8-ish -will need vaccines prior to discharge   FEN: TNA, CLD VTE: SQ[facility]  ID: Zosyn 10/9>> 10/20 Dispo: progressive   Below per primary team: hemorrhagic shock 2/2 above s/p 6 u PRBC, 2 u FFP 10/14 Septic shock 2/2 perforated appendicitis  EtOH abuse Alcohol related cardiomyopathy T2DM Chronic anemia Hx of TIA HTN HLD Gout Hx of GIB CAD Tobacco abuse  LOS: 13 days    Travis [name] 01/14/2021

## 2021-01-15 ENCOUNTER — Inpatient Hospital Stay (HOSPITAL_COMMUNITY): Payer: Medicare HMO

## 2021-01-15 DIAGNOSIS — Z515 Encounter for palliative care: Secondary | ICD-10-CM | POA: Diagnosis not present

## 2021-01-15 LAB — GLUCOSE, CAPILLARY
Glucose-Capillary: 195 mg/dL — ABNORMAL HIGH (ref 70–99)
Glucose-Capillary: 220 mg/dL — ABNORMAL HIGH (ref 70–99)
Glucose-Capillary: 248 mg/dL — ABNORMAL HIGH (ref 70–99)
Glucose-Capillary: 256 mg/dL — ABNORMAL HIGH (ref 70–99)
Glucose-Capillary: 228 mg/dL — ABNORMAL HIGH (ref 70–99)
Glucose-Capillary: 196 mg/dL — ABNORMAL HIGH (ref 70–99)
Glucose-Capillary: 198 mg/dL — ABNORMAL HIGH (ref 70–99)

## 2021-01-15 LAB — CBC
HCT: 23.5 % — ABNORMAL LOW (ref 39.0–52.0)
Hemoglobin: 7.6 g/dL — ABNORMAL LOW (ref 13.0–17.0)
MCH: 30.8 pg (ref 26.0–34.0)
MCHC: 32.3 g/dL (ref 30.0–36.0)
MCV: 95.1 fL (ref 80.0–100.0)
Platelets: 552 10*3/uL — ABNORMAL HIGH (ref 150–400)
RBC: 2.47 MIL/uL — ABNORMAL LOW (ref 4.22–5.81)
RDW: 15.9 % — ABNORMAL HIGH (ref 11.5–15.5)
WBC: 16 10*3/uL — ABNORMAL HIGH (ref 4.0–10.5)
nRBC: 0.2 % (ref 0.0–0.2)

## 2021-01-15 MED ORDER — IPRATROPIUM-ALBUTEROL 0.5-2.5 (3) MG/3ML IN SOLN
3.0000 mL | Freq: Four times a day (QID) | RESPIRATORY_TRACT | Status: DC | PRN
  Administered 2021-01-17: 3 mL via RESPIRATORY_TRACT
  Filled 2021-01-15: qty 3

## 2021-01-15 MED ORDER — OXYCODONE HCL ER 10 MG PO T12A
20.0000 mg | EXTENDED_RELEASE_TABLET | Freq: Two times a day (BID) | ORAL | Status: DC
Start: 2021-01-15 — End: 2021-02-09
  Administered 2021-01-15 – 2021-02-09 (×49): 20 mg via ORAL
  Filled 2021-01-15 (×50): qty 2

## 2021-01-15 MED ORDER — TRAVASOL 10 % IV SOLN
INTRAVENOUS | Status: AC
  Filled 2021-01-15: qty 1036.8

## 2021-01-15 NOTE — Progress Notes (Signed)
Progress Note   Subjective: Continues to have bowel function and tolerating clears. He has shortness of breath and abdominal pain. He complains of ongoing nausea somewhat improved from yesterday but no emesis. He has decreased appetite  Objective: Vital signs in last 24 hours: Temp:  [97.4 F (36.3 C)-98.9 F (37.2 C)] 97.8 F (36.6 C) (10/23 0751) Pulse Rate:  [97-114] 114 (10/23 0751) Resp:  [17-21] 20 (10/23 0751) BP: (92-130)/(61-78) 130/76 (10/23 0751) SpO2:  [95 %-100 %] 95 % (10/23 0751) Weight:  [85.8 kg] 85.8 kg (10/23 0500) Last BM Date: 01/13/21  Intake/Output from previous day: 10/22 0701 - 10/23 0700 In: 1350.1 [I.V.:1350.1] Out: 2675 [Urine:2650; Drains:25] Intake/Output this shift: Total I/O In: -  Out: 25 [Drains:25]  PE: Gen: alert, NAD. Sitting up in bed comfortably Resp: apparent increased work up breathing on supplemental O2 via [place]. CTAB Abd: soft, nondistended, appropriately TTP, normal BS Wound VAC with good suction JP with SS output, but minimal Skin: warm and dry   Lab Results:  Recent Labs    01/14/21 0341 01/15/21 0425  WBC 15.7* 16.0*  HGB 8.0* 7.6*  HCT 24.8* 23.5*  PLT 542* 552*    BMET Recent Labs    01/13/21 0502 01/14/21 0341  NA 135 135  K 4.3 4.4  CL 104 100  CO2 26 27  GLUCOSE 238* 296*  BUN 24* 30*  CREATININE 0.93 1.09  CALCIUM 8.9 9.0    PT/INR No results for input(s): LABPROT, INR in the last 72 hours.  CMP     Component Value Date/Time   NA 135 01/14/2021 0341   NA 138 06/20/2020 1034   NA 141 04/20/2014 1019   K 4.4 01/14/2021 0341   K 3.7 04/20/2014 1019   CL 100 01/14/2021 0341   CL 106 04/20/2014 1019   CO2 27 01/14/2021 0341   CO2 30 04/20/2014 1019   GLUCOSE 296 (H) 01/14/2021 0341   GLUCOSE 102 (H) 04/20/2014 1019   BUN 30 (H) 01/14/2021 0341   BUN 18 06/20/2020 1034   BUN 14 04/20/2014 1019   CREATININE 1.09 01/14/2021 0341   CREATININE 1.18 04/20/2014 1019   CALCIUM 9.0 01/14/2021  0341   CALCIUM 9.3 04/20/2014 1019   PROT 5.5 (L) 01/12/2021 0416   PROT 6.5 06/18/2017 1425   PROT 7.4 04/20/2014 1019   ALBUMIN 1.5 (L) 01/12/2021 0416   ALBUMIN 4.0 06/18/2017 1425   ALBUMIN 3.4 04/20/2014 1019   AST 20 01/12/2021 0416   AST 21 04/20/2014 1019   ALT 16 01/12/2021 0416   ALT 20 04/20/2014 1019   ALKPHOS 140 (H) 01/12/2021 0416   ALKPHOS 97 04/20/2014 1019   BILITOT 0.8 01/12/2021 0416   BILITOT 0.4 06/18/2017 1425   BILITOT 0.6 04/20/2014 1019   GFRNONAA >60 01/14/2021 0341   GFRNONAA >60 04/20/2014 1019   GFRNONAA >60 11/19/2012 1107   GFRAA 53 (L) 01/13/2020 1410   GFRAA >60 04/20/2014 1019   GFRAA >60 11/19/2012 1107   Lipase     Component Value Date/Time   LIPASE 31 01/01/2021 1023   LIPASE 112 04/20/2014 1019       Studies/Results: [place] EKG SITE RITE  Result Date: 01/13/2021 If Site Rite image not attached, placement could not be confirmed due to current cardiac rhythm.   Anti-infectives: Anti-infectives (From admission, onward)    Start     Dose/Rate Route Frequency Ordered Stop   01/09/21 1100  piperacillin-tazobactam (ZOSYN) IVPB 3.375 g  Status:  Discontinued  3.375 g 12.5 mL/hr over 240 Minutes Intravenous Every 8 hours 01/09/21 1000 01/12/21 0835   01/09/21 0830  ceFEPIme (MAXIPIME) 2 g in sodium chloride 0.9 % 100 mL IVPB  Status:  Discontinued        2 g 200 mL/hr over 30 Minutes Intravenous Every 12 hours 01/09/21 0737 01/09/21 1000   01/08/21 1400  metroNIDAZOLE (FLAGYL) IVPB 500 mg  Status:  Discontinued        500 mg 100 mL/hr over 60 Minutes Intravenous Every 12 hours 01/08/21 1059 01/09/21 1000   01/08/21 1400  ceFEPIme (MAXIPIME) 2 g in sodium chloride 0.9 % 100 mL IVPB  Status:  Discontinued        2 g 200 mL/hr over 30 Minutes Intravenous Every 24 hours 01/08/21 1114 01/09/21 0737   01/08/21 1000  vancomycin (VANCOREADY) IVPB 750 mg/150 mL  Status:  Discontinued        750 mg 150 mL/hr over 60 Minutes Intravenous  Every 24 hours 01/07/21 2052 01/09/21 1000   01/08/21 0600  vancomycin (VANCOREADY) IVPB 1500 mg/300 mL  Status:  Discontinued        1,500 mg 150 mL/hr over 120 Minutes Intravenous Every 24 hours 01/07/21 0430 01/07/21 1210   01/08/21 0600  vancomycin (VANCOREADY) IVPB 750 mg/150 mL  Status:  Discontinued        750 mg 150 mL/hr over 60 Minutes Intravenous Every 24 hours 01/07/21 1210 01/07/21 2052   01/07/21 1400  piperacillin-tazobactam (ZOSYN) IVPB 3.375 g  Status:  Discontinued        3.375 g 12.5 mL/hr over 240 Minutes Intravenous Every 8 hours 01/07/21 0430 01/08/21 1114   01/07/21 0530  piperacillin-tazobactam (ZOSYN) IVPB 3.375 g        3.375 g 100 mL/hr over 30 Minutes Intravenous STAT 01/07/21 0430 01/07/21 0622   01/07/21 0530  vancomycin (VANCOREADY) IVPB 1750 mg/350 mL        1,750 mg 175 mL/hr over 120 Minutes Intravenous  Once 01/07/21 0430 01/07/21 0841   01/05/21 1430  Ampicillin-Sulbactam (UNASYN) 3 g in sodium chloride 0.9 % 100 mL IVPB        3 g 200 mL/hr over 30 Minutes Intravenous Every 6 hours 01/05/21 1334 01/06/21 1552   01/01/21 2200  piperacillin-tazobactam (ZOSYN) IVPB 3.375 g        3.375 g 12.5 mL/hr over 240 Minutes Intravenous Every 8 hours 01/01/21 1841 01/04/21 2359   01/01/21 2100  piperacillin-tazobactam (ZOSYN) IVPB 3.375 g  Status:  Discontinued        3.375 g 100 mL/hr over 30 Minutes Intravenous Every 8 hours 01/01/21 1836 01/01/21 1841   01/01/21 1345  piperacillin-tazobactam (ZOSYN) IVPB 3.375 g        3.375 g 100 mL/hr over 30 Minutes Intravenous  Once 01/01/21 1337 01/01/21 1430        Assessment/Plan POD 14, s/p exploratory laparotomy, ileocecectomy, drainage of intraabdominal abscess 01/01/21 Dr. Grandville Silos for perforated appendicitis with abscess - advance to [place], discussed with pharmacy and will start weaning TPN since he is taking in breeze to hopefully improve appetite - Cxs w/ pansensitive E.Coli, zosyn stopped 10/20 - Drain SS  this AM, continue drain, but can likely DC soon after starting diet - VAC change M/W/F to midline - PT/OT rec SNF at discharge -adjusted pain meds for better pain control - CT scan with some hematoma in splenic bed and likely a seroma present.  No acute infectious processes identified.  L pleural effusion  noted.  Defer to primary service.    POD 8 s/p ex lap, splenectomy, application of incisional wound vac 10/14. Dr. Bobbye Morton for splenic laceration - hgb 7.6 this am. Continue to monitor -will need vaccines prior to discharge   FEN: TNA, FLD VTE: [facility]  ID: Zosyn 10/9>> 10/20 Dispo: progressive   Below per primary team: hemorrhagic shock 2/2 above s/p 6 u PRBC, 2 u FFP 10/14 Septic shock 2/2 perforated appendicitis  EtOH abuse Alcohol related cardiomyopathy T2DM Chronic anemia Hx of TIA HTN HLD Gout Hx of GIB CAD Tobacco abuse   LOS: 14 days    Winferd Humphrey, [facility] 01/15/2021, 8:42 AM Please see Amion for pager number during day hours 7:00am-4:30pm

## 2021-01-15 NOTE — Progress Notes (Signed)
PROGRESS NOTE        PATIENT DETAILS Name: Travis [name] Age: 65 y.o. Sex: male Date of Birth: 1955-04-28 Admit Date: 01/01/2021 Admitting Physician Georganna Skeans, MD WRU:EAVWU, Alyson Locket, NP  Brief Narrative: Patient is a 65 y.o. male with history of HFrEF, CKD stage III, COPD, HTN, DM-2, HLD, nonobstructive CAD-who presented with abdominal pain-found to have sepsis from ruptured appendicitis-underwent exploratory laparotomy/ileocecectomy and drainage of intra-abdominal abscess.Patient was briefly managed in the ICU-unfortunately-on 10/14 he developed severe abdominal pain -he was then found to have hemorrhagic shock due to a spontaneous splenic rupture-he was reevaluated by general surgery and underwent a splenectomy.  He was readmitted to the ICU-remained on the ventilator-he self extubated on 10/16-he was monitored closely-upon further stability-transfer to the [facility] hospital[facility] service on 10/20.  Significant events: 10/09>>admit- ex lap, remained intubated post op-to ICU 10/10>>extubated 10/12>> transferred to [facility] 10/14>>hypotensive/abd pain-CT showed spleenc rupture-to OR for splenectomy. 10/16>> Self-extubated  10/18>> Increased O2 requirement to 8L 10/19>> O2 weaned to 4L 10/20>> transferred to [facility]    Radiology/Echo 10/09>> CT abdomen: Acute appendicitis with perforation 10/14>> CT abdomen: Splenic rupture with large splenic hematoma 10/15>> TTE: EF 45-50% 10/20>> CT abdomen/pelvis: Indeterminate 2.4 cm fluid collection in the right lower quadrant adjacent to the cecal resection site.  Moderate left pleural effusion with compressive atelectasis.   Subjective:  Patient in bed, appears comfortable, denies any headache, no fever, no chest pain or pressure, no shortness of breath , improving abdominal pain. No new focal weakness.   Objective: Vitals: Blood pressure 124/71, pulse (!) 112, temperature 97.8 F (36.6 C), temperature source Oral, resp.  rate 20, height 6' (1.829 m), weight 85.8 kg, SpO2 97 %.   Exam:  Awake Alert, No new F.N deficits, Normal affect [place].AT,PERRAL Supple Neck, No JVD,   Symmetrical Chest wall movement, Good air movement bilaterally, CTAB RRR,No Gallops, Rubs or new Murmurs,  +ve B.Sounds, Abd Soft, No tenderness,  2 drains in place No Cyanosis, Clubbing or edema,     Assessment/Plan:  Sepsis due to acute appendicitis with perforation/intra-abdominal abscesses and Bacteroides bacteremia (s/p laparotomy/ileocecectomy and drainage of intra-abdominal abscess on 10/9): Sepsis physiology has resolved-Zosyn discontinued on 10/20.  On clear liquid diet.  CT abdomen on 10/20 with a small indeterminate fluid collection but otherwise stable.  General surgery following and directing care.    Hemorrhagic shock due to spontaneous splenic rupture s/p splenectomy on 10/14: Continue to follow hemoglobin- plan on appropriate vaccinations 2 weeks from splenectomy.    Acute hypoxic respiratory failure due to atelectasis/pleural effusion: Self extubated on 10/16-stable on anywhere from 2-4 L of oxygen-did develop some transient shortness of breath on 10/20-which responded to bronchodilators/diuretics.    Patient appears to have a moderate-sized left-sided pleural effusion which is likely transudative from-HFrEF/reactive to splenectomy.  His shortness of breath has improved-he is lying flat-plans are to continue with Lasix 40 mg IV twice daily, bronchodilators, mobilization, incentive spirometry etc.  We will monitor closely for further improvement, however if he redevelops issues with shortness of breath-we can then consider thoracocentesis.   Acute metabolic encephalopathy: Rapidly improving-continue supportive care.  AKI on CKD stage IIIa: AKI hemodynamically mediated-improving with supportive care.  HTN: BP stable-continue Coreg-losartan remains on hold  Nonischemic cardiomyopathy/HFrEF: Not in exacerbation-continue Lasix and  monitor volume status closely.  Follow electrolytes.   Nonobstructive CAD: No anginal  symptoms-already on beta-blocker-we will resume aspirin/statin over the next few days when more stable.  DM-2 (A1c 6.4 on 06/02/2020): CBGs stable-continue SSI  Recent Labs    01/15/21 0508 01/15/21 0834 01/15/21 1158  GLUCAP 220* 248* 256*     HLD: Resume statin/fenofibrate when oral intake was stable.  COPD: Stable-continue bronchodilators  Functional quadriplegia/debility/deconditioning: Due to acute illness-continue PT/OT eval-either SNF or LTAC on discharge.  Other issues: Has subclavian TLC since 10/14-this was removed on 10/21-he now has a PICC line through which TNA is infusing.  Nutrition Status: Nutrition Problem: Moderate Malnutrition Etiology: acute illness, chronic illness (perforated appendix, CAD, cardiomyopathy) Signs/Symptoms: mild muscle depletion, mild fat depletion Interventions: TPN  Obesity: Estimated body mass index is 25.65 kg/m as calculated from the following:   Height as of this encounter: 6' (1.829 m).   Weight as of this encounter: 85.8 kg.    Procedures: 10/9>>exploratory laparotomy, ileocecectomy, drainage of intraabdominal abscess  10/14>> exploratory laparotomy and splenectomy 10/14>> left subclavian triple-lumen catheter  Consults: General surgery, PCCM DVT Prophylaxis: Heparin Code Status:DNR Family Communication: Sister-in-law at bedside on 01/15/21.  Not at bedside today.  Time spent: 35 minutes-Greater than 50% of this time was spent in counseling, explanation of diagnosis, planning of further management, and coordination of care.  Diet: Diet Order             Diet full liquid Room service appropriate? Yes; Fluid consistency: Thin  Diet effective now                    Disposition Plan: Status is: Inpatient  Remains inpatient appropriate because: Requires inpatient level of care.   Barriers to Discharge: Perforated appendix with  pelvic abscess/splenic rupture-s/p laparotomy/splenectomy-not yet stable for discharge.     MEDICATIONS: Scheduled Meds:  sodium chloride   Intravenous Once   acetaminophen  1,000 mg Oral Q6H   carvedilol  12.5 mg Per Tube BID WC   Chlorhexidine Gluconate Cloth  6 each Topical Daily   feeding supplement  1 Container Oral BID BM   furosemide  40 mg Intravenous BID   heparin injection (subcutaneous)  5,000 Units Subcutaneous Q8H   insulin aspart  0-20 Units Subcutaneous Q4H   ipratropium-albuterol  3 mL Nebulization BID   mouth rinse  15 mL Mouth Rinse BID   methocarbamol  500 mg Oral QID   mirtazapine  7.5 mg Oral QHS   oxyCODONE  20 mg Oral Q12H   pantoprazole sodium  40 mg Oral Daily   sodium chloride flush  3 mL Intravenous Q12H   Continuous Infusions:  sodium chloride 10 mL/hr at 01/05/21 2306   sodium chloride     TPN ADULT (ION) 100 mL/hr at 01/14/21 1723   TPN ADULT (ION)     PRN Meds:.sodium chloride, Place/Maintain arterial line **AND** sodium chloride, albuterol, dextromethorphan, HYDROmorphone (DILAUDID) injection, hydroxypropyl methylcellulose / hypromellose, ondansetron (ZOFRAN) IV, oxyCODONE, phenol, sodium chloride flush   I have personally reviewed following labs and imaging studies  LABORATORY DATA: CBC: Recent Labs  Lab 01/09/21 0332 01/10/21 0219 01/11/21 0744 01/12/21 0750 01/13/21 0502 01/14/21 0341 01/15/21 0425  WBC 22.7* 20.5* 21.1* 21.8* 19.4* 15.7* 16.0*  NEUTROABS 20.7* 18.1*  --   --   --   --   --   HGB 8.4* 8.1* 7.6* 7.9* 8.1* 8.0* 7.6*  HCT 25.2* 25.2* 24.3* 25.1* 25.5* 24.8* 23.5*  MCV 91.0 94.4 96.8 97.3 96.6 95.8 95.1  PLT 297 344 391 468* 525* 542* 552*  Basic Metabolic Panel: Recent Labs  Lab 01/09/21 0332 01/10/21 0219 01/11/21 0419 01/12/21 0416 01/13/21 0502 01/14/21 0341  NA 142 146* 136 141 135 135  K 3.6 3.8 3.7 4.2 4.3 4.4  CL 111 114* 107 105 104 100  CO2 22 22 23 26 26 27   GLUCOSE 152* 182* 188* 191* 238*  296*  BUN 40* 26* 20 23 24* 30*  CREATININE 1.84* 1.29* 0.98 1.09 0.93 1.09  CALCIUM 8.3* 8.7* 8.6* 8.9 8.9 9.0  MG 2.9* 2.3 1.9 1.7 2.1  --   PHOS 3.1 2.3* 2.9 3.6  --  3.8    GFR: Estimated Creatinine Clearance: 74.2 mL/min (by C-G formula based on SCr of 1.09 mg/dL).  Liver Function Tests: Recent Labs  Lab 01/09/21 0332 01/10/21 0219 01/12/21 0416  AST 27 21 20   ALT 17 17 16   ALKPHOS 98 116 140*  BILITOT 2.0* 1.4* 0.8  PROT 5.2* 5.4* 5.5*  ALBUMIN <1.5* 1.5* 1.5*   No results for input(s): LIPASE, AMYLASE in the last 168 hours. No results for input(s): AMMONIA in the last 168 hours.  Coagulation Profile: No results for input(s): INR, PROTIME in the last 168 hours.   Cardiac Enzymes: No results for input(s): CKTOTAL, CKMB, CKMBINDEX, TROPONINI in the last 168 hours.  BNP (last 3 results) Recent Labs    06/02/20 1431  PROBNP 61.0    Lipid Profile: No results for input(s): CHOL, HDL, LDLCALC, TRIG, CHOLHDL, LDLDIRECT in the last 72 hours.   Thyroid Function Tests: No results for input(s): TSH, T4TOTAL, FREET4, T3FREE, THYROIDAB in the last 72 hours.  Anemia Panel: No results for input(s): VITAMINB12, FOLATE, FERRITIN, TIBC, IRON, RETICCTPCT in the last 72 hours.  Urine analysis:    Component Value Date/Time   COLORURINE AMBER (A) 01/01/2021 1338   APPEARANCEUR HAZY (A) 01/01/2021 1338   APPEARANCEUR Cloudy 11/19/2012 1107   LABSPEC >1.046 (H) 01/01/2021 1338   LABSPEC 1.024 11/19/2012 1107   PHURINE 5.0 01/01/2021 1338   GLUCOSEU 50 (A) 01/01/2021 1338   GLUCOSEU Negative 11/19/2012 1107   HGBUR SMALL (A) 01/01/2021 1338   BILIRUBINUR NEGATIVE 01/01/2021 1338   BILIRUBINUR Negative 11/19/2012 1107   KETONESUR NEGATIVE 01/01/2021 1338   PROTEINUR 30 (A) 01/01/2021 1338   UROBILINOGEN 0.2 09/14/2013 1225   NITRITE NEGATIVE 01/01/2021 1338   LEUKOCYTESUR NEGATIVE 01/01/2021 1338   LEUKOCYTESUR Negative 11/19/2012 1107    Sepsis Labs: Lactic Acid,  Venous    Component Value Date/Time   LATICACIDVEN 1.9 01/06/2021 1128    MICROBIOLOGY: Recent Results (from the past 240 hour(s))  MRSA Next Gen by PCR, Nasal     Status: None   Collection Time: 01/07/21  8:38 AM   Specimen: Nasal Mucosa; Nasal Swab  Result Value Ref Range Status   MRSA by PCR Next Gen NOT DETECTED NOT DETECTED Final    Comment: (NOTE) The GeneXpert MRSA Assay (FDA approved for NASAL specimens only), is one component of a comprehensive MRSA colonization surveillance program. It is not intended to diagnose MRSA infection nor to guide or monitor treatment for MRSA infections. Test performance is not FDA approved in patients less than 25 years old. Performed at [facility], [place] [address], [place], [place] [address]   Culture, blood (routine x 2)     Status: None   Collection Time: 01/09/21 10:19 AM   Specimen: BLOOD LEFT HAND  Result Value Ref Range Status   Specimen Description BLOOD LEFT HAND  Final   Special Requests  Final    BOTTLES DRAWN AEROBIC AND ANAEROBIC Blood Culture adequate volume   Culture   Final    NO GROWTH 5 DAYS Performed at [facility], [place] [address] [place], [place] [address]    Report Status 01/14/2021 FINAL  Final  Culture, blood (routine x 2)     Status: None   Collection Time: 01/09/21 10:30 AM   Specimen: BLOOD LEFT HAND  Result Value Ref Range Status   Specimen Description BLOOD LEFT HAND  Final   Special Requests   Final    BOTTLES DRAWN AEROBIC ONLY Blood Culture results may not be optimal due to an inadequate volume of blood received in culture bottles   Culture   Final    NO GROWTH 5 DAYS Performed at [facility], [place] [address] [place], [place] [address]    Report Status 01/14/2021 FINAL  Final    RADIOLOGY STUDIES/RESULTS: [place] EKG SITE RITE  Result Date: 01/13/2021 If Site Rite image not attached, placement could not be confirmed due to current cardiac rhythm.    LOS: 14 days    Signature  Lala Lund M.D on 01/15/2021 at 1:34 PM   -  To page go to [email or web address]

## 2021-01-15 NOTE — Progress Notes (Signed)
Palliative Medicine Inpatient Follow Up Note  Consulting Provider: Delilah Shan, RN   Reason for consult:   [facility] Palliative [facility] Consult  Reason for Consult? Pt is needing increasing pressor/vent support, 2 major abdominal surgeries. RN and MD have spoken with family multiple times about comfort care but family is still having some trouble processing everything. Thank you!    HPI:  Per intake H&P --> [name]. Travis [name] is a 65 year old gentleman with an extended PMH that includes but not limited to CHF, CKD 3A, COPD, CRA O, TIA, diabetes, hypertension, gout, hyperlipidemia, CAD (nonobstructive). He presented to the ED on 01/01/21 with complaints of right lower quadrant abdominal pain --> taken to the operating room where he was found to have a perforated appendix and abdominal abscess. 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 Now with rapidly worsening shock. Palliative care was asked to get involved to further address goals of care in the setting of overall critical illness and ongoing declining state.   Today's Discussion (01/15/2021):  *Please note that this is a verbal dictation therefore any spelling or grammatical errors are due to the "[facility]" system interpretation.  Chart reviewed.   I met with Travis [name] and his niece, Travis [name] at bedside.   We reviewed the concern that Travis [name] is having worsening abdominal pain today. He has had a decrease in his JP drain output and has generalized abdominal tenderness. Otherwise he endorses that his mood is still low in the setting of everything he has endured over the past two weeks.  I shared with Travis [name] that we are hoping to treat his underlying situational depression and better optimize his pain management. I endorsed that the hope with that would be that the would feel improved. I encouraged him to try to start mobilizing and getting out of bed though he states that  he does not wish to do this.   Travis [name] and I reviewed that if the burdens of the present level of care outweigh the benefits and that he feels the quality in his life is completely diminished that he can opt to cessate from aggressive measures. We reviewed having an open and honest dialogue moving forward.  A MOST was completed at bedside.  Palliative support was provided.  Objective Assessment: Vital Signs Vitals:   01/15/21 1012 01/15/21 1155  BP: 118/73 124/71  Pulse: (!) 112 (!) 112  Resp: 20 20  Temp: 97.8 F (36.6 C) 97.8 F (36.6 C)  SpO2: 100% 97%    Intake/Output Summary (Last 24 hours) at 01/15/2021 1207 Last data filed at 01/15/2021 0701 Gross per 24 hour  Intake 1350.1 ml  Output 1150 ml  Net 200.1 ml    Last Weight  Most recent update: 01/15/2021  5:20 AM    Weight  85.8 kg (189 lb 2.5 oz)            Gen: Very ill appearing AA M HEENT: Dry mucous membranes CV: Irregular rate and rhythm  PULM: On 4LPM [place] ABD: (+) abdominal dressings EXT: No edema  Neuro: Alert and oriented, able to converse  SUMMARY OF RECOMMENDATIONS   DNAR   Continue current level of care - Patient goals are for continued optimization  Primary team informed via secure chat of increase in pain (abdominal)    Patient is a member of [employer], will request chaplain support   Palliative care will  remain involved to aid in symptoms support  Symptoms: Abdominal Pain: - Oxycontin 4m PO BID - Continue oxycodone 5-154mPO Q4H PRN - Continue Dilaudid  Situation Depression: - Remeron 7.76m11mO QHS  Lack of Appetite: - Remeron as above - Diet consult  Generalized Weakness: - PT/OT  Time Spent: 48 Greater than 50% of the time was spent in counseling and coordination of care _________________________________________________________________________________ Travis [name] Team Cell Phone: [phone] utilize secure chat with  additional questions, if there is no response within 30 minutes please call the above phone number  Palliative Medicine Team providers are available by phone from 7am to 7pm daily and can be reached through the team cell phone.  Should this patient require assistance outside of these hours, please call the patient's attending physician.

## 2021-01-15 NOTE — Progress Notes (Signed)
Pt found on room air upon RT arrival for scheduled nebulizer treatment. Pt with increased work of breathing and shortness of breath. SpO2 93%. Pt placed back on previous 2L, SpO2 rose to 96% and work of breathing improved. Neb tx given at this time also. RT will continue to monitor and be available as needed.

## 2021-01-15 NOTE — Progress Notes (Signed)
PHARMACY - TOTAL PARENTERAL NUTRITION CONSULT NOTE   Indication: Prolonged ileus  Patient Measurements: Height: 6' (182.9 cm) Weight: 85.8 kg (189 lb 2.5 oz) IBW/kg (Calculated) : 77.6   Body mass index is 25.65 kg/m.  Assessment: 44 yom presenting 10/9 with acute perforated appendix, and abdominal abscess s/p OR for ex-lap, ilececectomy, and drainage of abscess on 10/9. Also had splenic laceration, s/p ex-lap, splenectomy, and wound vac application 63/84. Transferred to ICU on 10/12 with septic vs. hemorrhagic shock, intubated and on vasopressor support. Plan was initially to transition to comfort measures 10/14; however improved overnight with decreasing vasopressor requirement. Post-op ileus. Pharmacy consulted to start TPN.  Noted - BM documented since TPN consult placed (01/12/21). Per discussion with Surgery, will be at least 48 hrs until tube feeds started and likely longer until at goal. TPN will still be started today with patient already at least 8 days without nutrition. Current diet order is NPO   Glucose / Insulin: hx DM2 (no meds documented PTA). CBGs/12h 190-230 since new TPN bag hung with incr to 90 units of insulin R. SSI/12h: 18 units - will increase insulin R again today in TPN bag Electrolytes: From 10/22: Na 135, K 4.4 (goal >/=4 with ileus), Cl/HCO3 wnl,, CoCa 10.9 (Ca removed on 10/17), Phos 3.8, Mg 2.1 (10/21) Renal: AKI on CKD improving and now close to baseline. SCr up 1.09 << 0.93, BUN up 30 Hepatic: LFTs wnl. TG 203 (10/19) - will watch. Tbili down to 0.8, albumin 1.5 Intake / Output; MIVF: UOP okay, NGT removed 10/20 JP drain output 30 cc,  LBM 10/21 GI Imaging:  10/21 CXR: 2.4 cc fluid collection in RLQ near cecal resection site, post-op seroma/hematoma at splenectomy site  GI Surgeries / Procedures: none since TPN start  Central access: CVC triple lumen 10/14 >> 10/21; PICC 10/21 >> TPN start date: 01/08/21  Nutritional Goals: Goal TPN rate is 100 mL/hr  (provides 130 g of protein and 2421 kcals per day)  RD Assessment: Estimated Needs Total Energy Estimated Needs: 2400-2600 Total Protein Estimated Needs: 125-150 grams Total Fluid Estimated Needs: > 2 L  Current Nutrition:  DC NGT, CLD - no advancement 10/21 d/t nausea. Still with poor intake 10/22 due to abd pain and lack of appetite Boost breeze po bid (250 Kcal and 9 grams protein) - 1 charted as given 10/22  Plan:  Reduce TPN slightly to 80 ml/hr to provide 1937 kcal and 104g protein meeting ~80% of estimated needs (discussed reduction with surgery) and help with appetite stimulation Electrolytes in TPN - no changes 10/22: Na 40 meq, Mg 5 mEq/L. K 50 mEq/L, Ca 0 mEq/L, Phos 15 mmol/L, Cl:Ac 1:2  Add standard MVI and trace elements to TPN + folic acid and thiamine with hx ETOH abuse Continue resistant SSI q4h Adjust insulin R in TPN bag to 85 units with new bag hung this evening (ratio drop with change in CHO + increase due to SSI requirements) Monitor TPN labs on Mon/Thurs F/u return of bowel function, ability to initiate tube feeds and wean off TPN  Thank you for allowing pharmacy to be a part of this patient's care.  Alycia Rossetti, PharmD, BCPS Clinical Pharmacist Clinical phone for 01/15/2021: [phone] 01/15/2021 7:21 AM   **Pharmacist phone directory can now be found on [email or web address] ([email or web address]).  Listed under [place].

## 2021-01-16 ENCOUNTER — Inpatient Hospital Stay (HOSPITAL_COMMUNITY): Payer: Medicare HMO

## 2021-01-16 DIAGNOSIS — Z515 Encounter for palliative care: Secondary | ICD-10-CM | POA: Diagnosis not present

## 2021-01-16 LAB — COMPREHENSIVE METABOLIC PANEL
ALT: 25 U/L (ref 0–44)
AST: 27 U/L (ref 15–41)
Albumin: 1.7 g/dL — ABNORMAL LOW (ref 3.5–5.0)
Alkaline Phosphatase: 171 U/L — ABNORMAL HIGH (ref 38–126)
Anion gap: 10 (ref 5–15)
BUN: 43 mg/dL — ABNORMAL HIGH (ref 8–23)
CO2: 29 mmol/L (ref 22–32)
Calcium: 8.9 mg/dL (ref 8.9–10.3)
Chloride: 97 mmol/L — ABNORMAL LOW (ref 98–111)
Creatinine, Ser: 1.43 mg/dL — ABNORMAL HIGH (ref 0.61–1.24)
GFR, Estimated: 54 mL/min — ABNORMAL LOW (ref >60)
Glucose, Bld: 201 mg/dL — ABNORMAL HIGH (ref 70–99)
Potassium: 4 mmol/L (ref 3.5–5.1)
Sodium: 136 mmol/L (ref 135–145)
Total Bilirubin: 1.1 mg/dL (ref 0.3–1.2)
Total Protein: 5.9 g/dL — ABNORMAL LOW (ref 6.5–8.1)

## 2021-01-16 LAB — BRAIN NATRIURETIC PEPTIDE: B Natriuretic Peptide: 61.3 pg/mL (ref 0.0–100.0)

## 2021-01-16 LAB — CBC
HCT: 24.2 % — ABNORMAL LOW (ref 39.0–52.0)
Hemoglobin: 7.7 g/dL — ABNORMAL LOW (ref 13.0–17.0)
MCH: 30.2 pg (ref 26.0–34.0)
MCHC: 31.8 g/dL (ref 30.0–36.0)
MCV: 94.9 fL (ref 80.0–100.0)
Platelets: 565 10*3/uL — ABNORMAL HIGH (ref 150–400)
RBC: 2.55 MIL/uL — ABNORMAL LOW (ref 4.22–5.81)
RDW: 15.8 % — ABNORMAL HIGH (ref 11.5–15.5)
WBC: 19.5 10*3/uL — ABNORMAL HIGH (ref 4.0–10.5)
nRBC: 0.2 % (ref 0.0–0.2)

## 2021-01-16 LAB — PROCALCITONIN: Procalcitonin: 0.65 ng/mL

## 2021-01-16 LAB — GLUCOSE, CAPILLARY
Glucose-Capillary: 142 mg/dL — ABNORMAL HIGH (ref 70–99)
Glucose-Capillary: 153 mg/dL — ABNORMAL HIGH (ref 70–99)
Glucose-Capillary: 172 mg/dL — ABNORMAL HIGH (ref 70–99)
Glucose-Capillary: 178 mg/dL — ABNORMAL HIGH (ref 70–99)
Glucose-Capillary: 179 mg/dL — ABNORMAL HIGH (ref 70–99)
Glucose-Capillary: 181 mg/dL — ABNORMAL HIGH (ref 70–99)

## 2021-01-16 LAB — LACTIC ACID, PLASMA: Lactic Acid, Venous: 0.9 mmol/L (ref 0.5–1.9)

## 2021-01-16 LAB — TRIGLYCERIDES: Triglycerides: 139 mg/dL (ref <150)

## 2021-01-16 LAB — MAGNESIUM: Magnesium: 2.1 mg/dL (ref 1.7–2.4)

## 2021-01-16 LAB — PHOSPHORUS: Phosphorus: 4.7 mg/dL — ABNORMAL HIGH (ref 2.5–4.6)

## 2021-01-16 MED ORDER — LACTATED RINGERS IV SOLN: INTRAVENOUS | Status: DC

## 2021-01-16 MED ORDER — CHLORHEXIDINE GLUCONATE 0.12 % MT SOLN
OROMUCOSAL | Status: AC
  Administered 2021-01-16: 15 mL
  Filled 2021-01-16: qty 15

## 2021-01-16 MED ORDER — IOHEXOL 350 MG/ML SOLN
75.0000 mL | Freq: Once | INTRAVENOUS | Status: AC | PRN
  Administered 2021-01-16: 75 mL via INTRAVENOUS

## 2021-01-16 MED ORDER — METOPROLOL TARTRATE 50 MG PO TABS
50.0000 mg | ORAL_TABLET | Freq: Two times a day (BID) | ORAL | Status: DC
  Administered 2021-01-16 – 2021-01-18 (×6): 50 mg via ORAL
  Filled 2021-01-16 (×6): qty 1

## 2021-01-16 MED ORDER — TRAVASOL 10 % IV SOLN
INTRAVENOUS | Status: AC
  Filled 2021-01-16: qty 1036.8

## 2021-01-16 MED ORDER — LACTATED RINGERS IV BOLUS
500.0000 mL | Freq: Once | INTRAVENOUS | Status: AC
  Administered 2021-01-16: 500 mL via INTRAVENOUS

## 2021-01-16 MED ORDER — ENSURE ENLIVE PO LIQD
237.0000 mL | Freq: Three times a day (TID) | ORAL | Status: DC
  Administered 2021-01-16 – 2021-01-31 (×32): 237 mL via ORAL

## 2021-01-16 NOTE — Progress Notes (Signed)
Nutrition Follow-up  DOCUMENTATION CODES:   Non-severe (moderate) malnutrition in context of chronic illness  INTERVENTION:   -D/c Boost Breeze -TPN management per pharmacy -RD will follow for diet advancement and adjust supplement regimen as appropriate -Ensure Enlive po TID, each supplement provides 350 kcal and 20 grams of protein  -Magic cup TID with meals, each supplement provides 290 kcal and 9 grams of protein   NUTRITION DIAGNOSIS:   Moderate Malnutrition related to acute illness, chronic illness (perforated appendix, CAD, cardiomyopathy) as evidenced by mild muscle depletion, mild fat depletion.  Ongoing  GOAL:   Patient will meet greater than or equal to 90% of their needs  Progressing   MONITOR:   Diet advancement, Labs, Weight trends, Skin, I & O's  REASON FOR ASSESSMENT:   Consult Assessment of nutrition requirement/status  ASSESSMENT:   65 yo male admitted with perforated appendix and abdominal abscess. S/P ex lap, ileocecectomy, drainage of intra-abdominal abscess. PMH includes CHF, CKD 3A, COPD, TIA, DM, HTN, gout, HLD, CAD, alcohol abuse, tobacco abuse.  10/9- s/p ex lap, ileocectomy, drainage of intra-abdominal abscess 10/11- s/p ex lap, splenectomy, and incisional wound vac placement 10/16- extubated, TPN initiated 10/20- advanced to clear liquid diet, NGT d/c 10/23- advanced to full liquid diet  Reviewed I/O's: +425 ml x 24 hours and +366 ml since 01/02/21  UOP: 550 ml x 24 hours  Drain output: 31 ml x 24 hours   Reviewed I/O's: -424 ml x 24 hours and -866 ml since admission  UOP: 500 ml x 24 hours  Spoke with pt, who was sitting in recliner chair at time of visit. Pt did not respond to RD questions and did not make eye contact with this RD. Noted cup of Boost Breeze at bedside.   Pt has been advanced to full liquid diet, however, with minimal intake per chart review. Pt also with abdominal distention. No meal completion records available  to assess at this time.   Pt remains on TPN for nutritional support. Pt currently receiving TPN at 80 ml/hr, which provides 1937 kcals and 104 grams protein, meeting 81% of estimated kcal needs and 83% of estimated protein needs.  Medications reviewed and include remeron.  Labs reviewed:Phos: 4.7, CBGS: 179 (inpatient orders for glycemic control are none).    Diet Order:   Diet Order             Diet full liquid Room service appropriate? Yes; Fluid consistency: Thin  Diet effective now                   EDUCATION NEEDS:   No education needs have been identified at this time  Skin:  Skin Assessment: Skin Integrity Issues: Skin Integrity Issues:: Wound VAC Wound Vac: abdomen  Last BM:  01/13/21  Height:   Ht Readings from Last 1 Encounters:  01/01/21 6' (1.829 m)    Weight:   Wt Readings from Last 1 Encounters:  01/16/21 85.7 kg   BMI:  Body mass index is 25.62 kg/m.  Estimated Nutritional Needs:   Kcal:  2400-2600  Protein:  125-150 grams  Fluid:  > 2 L    Loistine Chance, RD, LDN, [place] Registered Dietitian II Certified Diabetes Care and Education Specialist Please refer to [facility] for RD and/or RD on-call/weekend/after hours pager

## 2021-01-16 NOTE — Progress Notes (Addendum)
Progress Note   Subjective: No acute change. Feels maybe slightly better.   Objective: Vital signs in last 24 hours: Temp:  [97.8 F (36.6 C)-100 F (37.8 C)] 99.1 F (37.3 C) (10/24 0738) Pulse Rate:  [105-118] 111 (10/24 0738) Resp:  [19-24] 20 (10/24 0738) BP: (100-124)/(64-73) 121/69 (10/24 0738) SpO2:  [95 %-100 %] 98 % (10/24 0318) Weight:  [85.7 kg] 85.7 kg (10/24 0500) Last BM Date: 01/13/21  Intake/Output from previous day: 10/23 0701 - 10/24 0700 In: 1005.3 [I.V.:1005.3] Out: 580.5 [Urine:550; Drains:30.5] Intake/Output this shift: No intake/output data recorded.  PE: Gen: alert, NAD.  Resp: apparent increased work up breathing on supplemental O2 via [place]. CTAB Abd: soft, nondistended, appropriately TTP, normal BS. JP w low volume serous output, black dot is about 5cm from the skin but still holding suction Wound VAC with good suction Skin: warm and dry   Lab Results:  Recent Labs    01/15/21 0425 01/16/21 0522  WBC 16.0* 19.5*  HGB 7.6* 7.7*  HCT 23.5* 24.2*  PLT 552* 565*    BMET Recent Labs    01/14/21 0341 01/16/21 0522  NA 135 136  K 4.4 4.0  CL 100 97*  CO2 27 29  GLUCOSE 296* 201*  BUN 30* 43*  CREATININE 1.09 1.43*  CALCIUM 9.0 8.9    PT/INR No results for input(s): LABPROT, INR in the last 72 hours.  CMP     Component Value Date/Time   NA 136 01/16/2021 0522   NA 138 06/20/2020 1034   NA 141 04/20/2014 1019   K 4.0 01/16/2021 0522   K 3.7 04/20/2014 1019   CL 97 (L) 01/16/2021 0522   CL 106 04/20/2014 1019   CO2 29 01/16/2021 0522   CO2 30 04/20/2014 1019   GLUCOSE 201 (H) 01/16/2021 0522   GLUCOSE 102 (H) 04/20/2014 1019   BUN 43 (H) 01/16/2021 0522   BUN 18 06/20/2020 1034   BUN 14 04/20/2014 1019   CREATININE 1.43 (H) 01/16/2021 0522   CREATININE 1.18 04/20/2014 1019   CALCIUM 8.9 01/16/2021 0522   CALCIUM 9.3 04/20/2014 1019   PROT 5.9 (L) 01/16/2021 0522   PROT 6.5 06/18/2017 1425   PROT 7.4 04/20/2014  1019   ALBUMIN 1.7 (L) 01/16/2021 0522   ALBUMIN 4.0 06/18/2017 1425   ALBUMIN 3.4 04/20/2014 1019   AST 27 01/16/2021 0522   AST 21 04/20/2014 1019   ALT 25 01/16/2021 0522   ALT 20 04/20/2014 1019   ALKPHOS 171 (H) 01/16/2021 0522   ALKPHOS 97 04/20/2014 1019   BILITOT 1.1 01/16/2021 0522   BILITOT 0.4 06/18/2017 1425   BILITOT 0.6 04/20/2014 1019   GFRNONAA 54 (L) 01/16/2021 0522   GFRNONAA >60 04/20/2014 1019   GFRNONAA >60 11/19/2012 1107   GFRAA 53 (L) 01/13/2020 1410   GFRAA >60 04/20/2014 1019   GFRAA >60 11/19/2012 1107   Lipase     Component Value Date/Time   LIPASE 31 01/01/2021 1023   LIPASE 112 04/20/2014 1019       Studies/Results: DG Abd 1 View  Result Date: 01/15/2021 CLINICAL DATA:  Abdominal pain. EXAM: ABDOMEN - 1 VIEW COMPARISON:  01/10/2021 FINDINGS: Nasogastric 2 has been withdrawn or removed. Bowel gas pattern is nonobstructive. Residual contrast is identified in nondilated loops of colon. Moderate degenerative changes in the lumbar spine. Remote RIGHT hip arthroplasty. IMPRESSION: Nonobstructive bowel gas pattern. Residual contrast in nondilated colon. Electronically Signed   By: Nolon Nations M.D.  On: 01/15/2021 13:44   CT ABDOMEN PELVIS W CONTRAST  Result Date: 01/16/2021 CLINICAL DATA:  65 year old male status post partial colon resection and splenectomy splenectomy site seroma/hematoma. Abdominal distension. EXAM: CT ABDOMEN AND PELVIS WITH CONTRAST TECHNIQUE: Multidetector CT imaging of the abdomen and pelvis was performed using the standard protocol following bolus administration of intravenous contrast. CONTRAST:  11mL OMNIPAQUE IOHEXOL 350 MG/ML SOLN COMPARISON:  CT Chest, Abdomen and Pelvis 01/12/2021 FINDINGS: Lower chest: Moderate left pleural effusion with simple fluid density but substantial compressive atelectasis of the left lower lobe has not regressed. No pericardial effusion. Right lower lobe consolidation persists, indeterminate  for severe atelectasis (the parenchyma is enhancing) versus pneumonia. Middle lobes remain aerated. Hepatobiliary: Stable, negative. Pancreas: The pancreas remains within normal limits, although there is inflammation at the tail of the pancreas from the splenic region. Spleen: Splenectomy with complex mixed density and the larger component partially rim enhancing fluid collection in the left upper quadrant at the splenectomy bed. The larger component of organized collection there is 62 x 83 by 63 mm for an estimated volume of 162 mL. A smaller rim enhancing component is anterior and lateral to the dominant collection as seen on series 3, image 23. Associated mass effect on the gastric cardia and proximal greater curve, and some of the fluid is inseparable from the gastric serosa (series 3, image 19). No gas within the collections. Adrenals/Urinary Tract: Urinary bladder partially obscured by streak artifact from the hip arthroplasty. Stable adrenal glands and kidneys. Symmetric renal enhancement and early contrast excretion on the delayed images. Stomach/Bowel: Oral contrast throughout the residual large bowel, which appears negative from the hepatic flexure distally. Right lower quadrant percutaneous drain with regional mesenteric inflammation, mild architectural distortion at the cecum. The small discontiguous rim enhancing fluid collections in the lower small bowel mesentery at the pelvic inlet persist and are rim enhancing today on series 3, image 68, but have not increased in size. Collectively these are less than 10 mL. Only mild inflammation of the distal small bowel now. No dilated small bowel. Duodenal diverticulum redemonstrated. Stable ventral midline abdominal postoperative changes. Mass effect and architectural distortion on the stomach is described with the spleen. No free air identified. Vascular/Lymphatic: Aortoiliac calcified atherosclerosis. Major arterial structures in the abdomen and pelvis  remain patent. Portal venous system is patent. No lymphadenopathy identified. Reproductive: Negative. Other: No pelvic free fluid. Musculoskeletal: Advanced lumbar spine degeneration. Right hip arthroplasty. No acute osseous abnormality identified. IMPRESSION: 1. Splenectomy with organizing complex fluid collections in the left upper quadrant. The largest collection there is 162 mL. Smaller adjacent collections, some inseparable from the gastric serosa and with associated mass effect and architectural distortion on the stomach. Again favor hematoma/seroma. Abscess is not excluded, but there is no gas within the collections. 2. Stable small discontiguous rim enhancing fluid collections in the lower small bowel mesentery at the pelvic inlet a short distance from the percutaneous drain. These have not significantly changed and collectively are less than 10 mL. 3. Moderate left pleural effusion with substantial left lower lobe atelectasis persists. Right lower lobe consolidation persists, indeterminate for atelectasis versus pneumonia. 4. No bowel obstruction or new abnormality identified in the abdomen or pelvis. 5. Aortic Atherosclerosis (ICD10-I70.0). Electronically Signed   By: Genevie Ann M.D.   On: 01/16/2021 04:34   DG Chest Port 1 View  Result Date: 01/16/2021 CLINICAL DATA:  Shortness of breath.  Asthma. EXAM: PORTABLE CHEST 1 VIEW COMPARISON:  01/14/2020 FINDINGS: Left arm PICC  is been placed with the tip in the mid right atrium. Withdraw 4 cm to be above the right atrium if desired. Artifact overlies the chest otherwise. Hazy opacity overlying the left chest probably relates to a layering effusion with associated dependent atelectasis. Mild atelectasis present in the right lower lobe. IMPRESSION: Left arm PICC tip in the right atrium. Withdraw 4 cm be above the right atrium if desired. Layering pleural effusion on the left with atelectatic changes of the left lung. Lesser patchy atelectasis at the right lung  base. Electronically Signed   By: Nelson Chimes M.D.   On: 01/16/2021 08:38    Anti-infectives: Anti-infectives (From admission, onward)    Start     Dose/Rate Route Frequency Ordered Stop   01/09/21 1100  piperacillin-tazobactam (ZOSYN) IVPB 3.375 g  Status:  Discontinued        3.375 g 12.5 mL/hr over 240 Minutes Intravenous Every 8 hours 01/09/21 1000 01/12/21 0835   01/09/21 0830  ceFEPIme (MAXIPIME) 2 g in sodium chloride 0.9 % 100 mL IVPB  Status:  Discontinued        2 g 200 mL/hr over 30 Minutes Intravenous Every 12 hours 01/09/21 0737 01/09/21 1000   01/08/21 1400  metroNIDAZOLE (FLAGYL) IVPB 500 mg  Status:  Discontinued        500 mg 100 mL/hr over 60 Minutes Intravenous Every 12 hours 01/08/21 1059 01/09/21 1000   01/08/21 1400  ceFEPIme (MAXIPIME) 2 g in sodium chloride 0.9 % 100 mL IVPB  Status:  Discontinued        2 g 200 mL/hr over 30 Minutes Intravenous Every 24 hours 01/08/21 1114 01/09/21 0737   01/08/21 1000  vancomycin (VANCOREADY) IVPB 750 mg/150 mL  Status:  Discontinued        750 mg 150 mL/hr over 60 Minutes Intravenous Every 24 hours 01/07/21 2052 01/09/21 1000   01/08/21 0600  vancomycin (VANCOREADY) IVPB 1500 mg/300 mL  Status:  Discontinued        1,500 mg 150 mL/hr over 120 Minutes Intravenous Every 24 hours 01/07/21 0430 01/07/21 1210   01/08/21 0600  vancomycin (VANCOREADY) IVPB 750 mg/150 mL  Status:  Discontinued        750 mg 150 mL/hr over 60 Minutes Intravenous Every 24 hours 01/07/21 1210 01/07/21 2052   01/07/21 1400  piperacillin-tazobactam (ZOSYN) IVPB 3.375 g  Status:  Discontinued        3.375 g 12.5 mL/hr over 240 Minutes Intravenous Every 8 hours 01/07/21 0430 01/08/21 1114   01/07/21 0530  piperacillin-tazobactam (ZOSYN) IVPB 3.375 g        3.375 g 100 mL/hr over 30 Minutes Intravenous STAT 01/07/21 0430 01/07/21 0622   01/07/21 0530  vancomycin (VANCOREADY) IVPB 1750 mg/350 mL        1,750 mg 175 mL/hr over 120 Minutes Intravenous   Once 01/07/21 0430 01/07/21 0841   01/05/21 1430  Ampicillin-Sulbactam (UNASYN) 3 g in sodium chloride 0.9 % 100 mL IVPB        3 g 200 mL/hr over 30 Minutes Intravenous Every 6 hours 01/05/21 1334 01/06/21 1552   01/01/21 2200  piperacillin-tazobactam (ZOSYN) IVPB 3.375 g        3.375 g 12.5 mL/hr over 240 Minutes Intravenous Every 8 hours 01/01/21 1841 01/04/21 2359   01/01/21 2100  piperacillin-tazobactam (ZOSYN) IVPB 3.375 g  Status:  Discontinued        3.375 g 100 mL/hr over 30 Minutes Intravenous Every 8 hours 01/01/21 1836  01/01/21 1841   01/01/21 1345  piperacillin-tazobactam (ZOSYN) IVPB 3.375 g        3.375 g 100 mL/hr over 30 Minutes Intravenous  Once 01/01/21 1337 01/01/21 1430        Assessment/Plan POD 15, s/p exploratory laparotomy, ileocecectomy, drainage of intraabdominal abscess 01/01/21 Dr. [place] Silos for perforated appendicitis with abscess - zosyn stopped 10/20 - Drain SS this AM, minimal output- remove today - VAC change M/W/F to midline - PT/OT rec SNF at discharge -adjusted pain meds for better pain control    POD 9 s/p ex lap, splenectomy, application of incisional wound vac 10/14. Dr. Bobbye Morton for splenic laceration - CT this morning with complex LUQ fluid collection, no clear evidence of infection. Rising WBC may be secondary to splenectomy, continue to monitor   FEN: TNA, FLD VTE: SQH  ID: Zosyn 10/9>> 10/20 Dispo: progressive   Below per primary team: hemorrhagic shock 2/2 above s/p 6 u PRBC, 2 u FFP 10/14 Septic shock 2/2 perforated appendicitis  EtOH abuse Alcohol related cardiomyopathy T2DM Chronic anemia Hx of TIA HTN HLD Gout Hx of GIB CAD Tobacco abuse L pleural effusion   LOS: 15 days    Clovis Riley, MD [facility] 01/16/2021, 9:50 AM Please see Amion for pager number during day hours 7:00am-4:30pm

## 2021-01-16 NOTE — Progress Notes (Signed)
PROGRESS NOTE        PATIENT DETAILS Name: Travis [name] Age: 65 y.o. Sex: male Date of Birth: [birth year] Admit Date: 01/01/2021 Admitting Physician Georganna Skeans, MD CMK:[id number], Alyson Locket, NP  Brief Narrative: Patient is a 65 y.o. male with history of HFrEF, CKD stage III, COPD, HTN, DM-2, HLD, nonobstructive CAD-who presented with abdominal pain-found to have sepsis from ruptured appendicitis-underwent exploratory laparotomy/ileocecectomy and drainage of intra-abdominal abscess.Patient was briefly managed in the ICU-unfortunately-on 10/14 he developed severe abdominal pain -he was then found to have hemorrhagic shock due to a spontaneous splenic rupture-he was reevaluated by general surgery and underwent a splenectomy.  He was readmitted to the ICU-remained on the ventilator-he self extubated on 10/16-he was monitored closely-upon further stability-transfer to the [facility] hospitalist service on 10/20.  Significant events: 10/09>>admit- ex lap, remained intubated post op-to ICU 10/10>>extubated 10/12>> transferred to [facility] 10/14>>hypotensive/abd pain-CT showed spleenc rupture-to OR for splenectomy. 10/16>> Self-extubated  10/18>> Increased O2 requirement to 8L 10/19>> O2 weaned to 4L 10/20>> transferred to [facility]    Radiology/Echo 10/09>> CT abdomen: Acute appendicitis with perforation 10/14>> CT abdomen: Splenic rupture with large splenic hematoma 10/15>> TTE: EF 45-50% 10/20>> CT abdomen/pelvis: Indeterminate 2.4 cm fluid collection in the right lower quadrant adjacent to the cecal resection site.  Moderate left pleural effusion with compressive atelectasis. 10/24 >> CT - 1. Splenectomy with organizing complex fluid collections in the left upper quadrant. The largest collection there is 162 mL. Smaller adjacent collections, some inseparable from the gastric serosa and with associated mass effect and architectural distortion on the stomach. Again favor  hematoma/seroma. Abscess is not excluded, but there is no gas within the collections. 2. Stable small discontiguous rim enhancing fluid collections in the lower small bowel mesentery at the pelvic inlet a short distance from the percutaneous drain. These have not significantly changed and collectively are less than 10 mL    Subjective:  Patient in bed, appears comfortable, denies any headache, no fever, no chest pain or pressure, no shortness of breath , improved abdominal pain. No new focal weakness.    Objective: Vitals: Blood pressure 121/69, pulse (!) 111, temperature 99.1 F (37.3 C), temperature source Oral, resp. rate 20, height 6' (1.829 m), weight 85.7 kg, SpO2 98 %.   Exam:  Awake Alert, No new F.N deficits, Normal affect [place].AT,PERRAL Supple Neck, No JVD,   Symmetrical Chest wall movement, Good air movement bilaterally, CTAB RRR,No Gallops, Rubs or new Murmurs,  +ve B.Sounds, Abd Soft, mild tenderness,  W-Vac in place, drain in place No Cyanosis, Clubbing or edema,       Assessment/Plan:   Sepsis due to acute appendicitis with perforation/intra-abdominal abscesses and Bacteroides bacteremia (s/p laparotomy/ileocecectomy and drainage of intra-abdominal abscess on 10/9): Sepsis physiology has resolved-Zosyn discontinued on 10/20.  On clear liquid diet + TNA.  CT abdomen on 10/4  noted - defer management to [facility].    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    Acute hypoxic respiratory failure due to atelectasis/pleural effusion: Self extubated on 10/16-stable on anywhere from 2-4 L of oxygen-did develop some transient shortness of breath on 10/20-which responded to bronchodilators/diuretics.    Patient appears to have a moderate-sized left-sided pleural effusion which is likely transudative from-HFrEF/reactive to splenectomy.  PRN Lasix,  however if he redevelops issues with shortness of breath-we can then consider thoracocentesis.   Acute metabolic encephalopathy: Rapidly improving-continue supportive care.  AKI on CKD stage IIIa: AKI hemodynamically mediated-hold Lasix, IVF 01/16/21 - monitor.  HTN: BP stable-continue Coreg-losartan remains on hold  Nonischemic cardiomyopathy/HFrEF: Not in exacerbation-continue Lasix and monitor volume status closely.  Follow electrolytes.   Nonobstructive CAD: No anginal symptoms-already on beta-blocker-we will resume aspirin/statin over the next few days when more stable.  DM-2 (A1c 6.4 on 06/02/2020): CBGs stable-continue SSI  Recent Labs    01/15/21 2314 01/16/21 0333 01/16/21 0754  GLUCAP 198* 181* 179*     HLD: Resume statin/fenofibrate when oral intake was stable.  COPD: Stable-continue bronchodilators  Functional quadriplegia/debility/deconditioning: Due to acute illness-continue PT/OT eval-either SNF or LTAC on discharge.  Other issues: Has subclavian TLC since 10/14-this was removed on 10/21-he now has a PICC line through which TNA is infusing.  Nutrition Status: Nutrition Problem: Moderate Malnutrition Etiology: acute illness, chronic illness (perforated appendix, CAD, cardiomyopathy) Signs/Symptoms: mild muscle depletion, mild fat depletion Interventions: TPN  Obesity: Estimated body mass index is 25.62 kg/m as calculated from the following:   Height as of this encounter: 6' (1.829 m).   Weight as of this encounter: 85.7 kg.    Procedures: 10/9>>exploratory laparotomy, ileocecectomy, drainage of intraabdominal abscess  10/14>> exploratory laparotomy and splenectomy 10/14>> left subclavian triple-lumen catheter  Consults: General surgery, PCCM DVT Prophylaxis: Heparin Code Status:DNR Family Communication: Sister-in-law at bedside on 01/15/21.  Not at bedside today.  Time spent: 35 minutes-Greater than 50% of this time was spent in counseling,  explanation of diagnosis, planning of further management, and coordination of care.  Diet: Diet Order             Diet full liquid Room service appropriate? Yes; Fluid consistency: Thin  Diet effective now                    Disposition Plan: Status is: Inpatient  Remains inpatient appropriate because: Requires inpatient level of care.   Barriers to Discharge: Perforated appendix with pelvic abscess/splenic rupture-s/p laparotomy/splenectomy-not yet stable for discharge.     MEDICATIONS: Scheduled Meds:  acetaminophen  1,000 mg Oral Q6H   Chlorhexidine Gluconate Cloth  6 each Topical Daily   feeding supplement  1 Container Oral BID BM   heparin injection (subcutaneous)  5,000 Units Subcutaneous Q8H   insulin aspart  0-20 Units Subcutaneous Q4H   mouth rinse  15 mL Mouth Rinse BID   methocarbamol  500 mg Oral QID   metoprolol tartrate  50 mg Oral BID   mirtazapine  7.5 mg Oral QHS   oxyCODONE  20 mg Oral Q12H   pantoprazole sodium  40 mg Oral Daily   sodium chloride flush  3 mL Intravenous Q12H   Continuous Infusions:  sodium chloride     TPN ADULT (ION) 80 mL/hr at 01/15/21 1721   PRN Meds:.Place/Maintain arterial line **AND** sodium chloride, dextromethorphan, HYDROmorphone (DILAUDID) injection, hydroxypropyl methylcellulose / hypromellose, ipratropium-albuterol, ondansetron (ZOFRAN) IV, oxyCODONE, phenol, sodium chloride flush   I have personally reviewed following labs and imaging studies  LABORATORY DATA: CBC: Recent Labs  Lab 01/10/21 0219 01/11/21 0744 01/12/21 0750 01/13/21 0502 01/14/21 0341 01/15/21 0425 01/16/21 0522  WBC 20.5*   < > 21.8* 19.4* 15.7* 16.0* 19.5*  NEUTROABS 18.1*  --   --   --   --   --   --   HGB 8.1*   < >  7.9* 8.1* 8.0* 7.6* 7.7*  HCT 25.2*   < > 25.1* 25.5* 24.8* 23.5* 24.2*  MCV 94.4   < > 97.3 96.6 95.8 95.1 94.9  PLT 344   < > 468* 525* 542* 552* 565*   < > = values in this interval not displayed.    Basic  Metabolic Panel: Recent Labs  Lab 01/10/21 0219 01/11/21 0419 01/12/21 0416 01/13/21 0502 01/14/21 0341 01/16/21 0522  NA 146* 136 141 135 135 136  K 3.8 3.7 4.2 4.3 4.4 4.0  CL 114* 107 105 104 100 97*  CO2 22 23 26 26 27 29   GLUCOSE 182* 188* 191* 238* 296* 201*  BUN 26* 20 23 24* 30* 43*  CREATININE 1.29* 0.98 1.09 0.93 1.09 1.43*  CALCIUM 8.7* 8.6* 8.9 8.9 9.0 8.9  MG 2.3 1.9 1.7 2.1  --  2.1  PHOS 2.3* 2.9 3.6  --  3.8 4.7*    GFR: Estimated Creatinine Clearance: 56.5 mL/min (A) (by C-G formula based on SCr of 1.43 mg/dL (H)).  Liver Function Tests: Recent Labs  Lab 01/10/21 0219 01/12/21 0416 01/16/21 0522  AST 21 20 27   ALT 17 16 25   ALKPHOS 116 140* 171*  BILITOT 1.4* 0.8 1.1  PROT 5.4* 5.5* 5.9*  ALBUMIN 1.5* 1.5* 1.7*   No results for input(s): LIPASE, AMYLASE in the last 168 hours. No results for input(s): AMMONIA in the last 168 hours.  Coagulation Profile: No results for input(s): INR, PROTIME in the last 168 hours.   Cardiac Enzymes: No results for input(s): CKTOTAL, CKMB, CKMBINDEX, TROPONINI in the last 168 hours.  BNP (last 3 results) Recent Labs    06/02/20 1431  PROBNP 61.0    Lipid Profile: Recent Labs    01/16/21 0522  TRIG 139     Thyroid Function Tests: No results for input(s): TSH, T4TOTAL, FREET4, T3FREE, THYROIDAB in the last 72 hours.  Anemia Panel: No results for input(s): VITAMINB12, FOLATE, FERRITIN, TIBC, IRON, RETICCTPCT in the last 72 hours.  Urine analysis:    Component Value Date/Time   COLORURINE AMBER (A) 01/01/2021 1338   APPEARANCEUR HAZY (A) 01/01/2021 1338   APPEARANCEUR Cloudy 11/19/2012 1107   LABSPEC >1.046 (H) 01/01/2021 1338   LABSPEC 1.024 11/19/2012 1107   PHURINE 5.0 01/01/2021 1338   GLUCOSEU 50 (A) 01/01/2021 1338   GLUCOSEU Negative 11/19/2012 1107   HGBUR SMALL (A) 01/01/2021 1338   BILIRUBINUR NEGATIVE 01/01/2021 1338   BILIRUBINUR Negative 11/19/2012 1107   KETONESUR NEGATIVE  01/01/2021 1338   PROTEINUR 30 (A) 01/01/2021 1338   UROBILINOGEN 0.2 09/14/2013 1225   NITRITE NEGATIVE 01/01/2021 1338   LEUKOCYTESUR NEGATIVE 01/01/2021 1338   LEUKOCYTESUR Negative 11/19/2012 1107    Sepsis Labs: Lactic Acid, Venous    Component Value Date/Time   LATICACIDVEN 0.9 01/16/2021 0536     RADIOLOGY STUDIES/RESULTS: DG Abd 1 View  Result Date: 01/15/2021 CLINICAL DATA:  Abdominal pain. EXAM: ABDOMEN - 1 VIEW COMPARISON:  01/10/2021 FINDINGS: Nasogastric 2 has been withdrawn or removed. Bowel gas pattern is nonobstructive. Residual contrast is identified in nondilated loops of colon. Moderate degenerative changes in the lumbar spine. Remote RIGHT hip arthroplasty. IMPRESSION: Nonobstructive bowel gas pattern. Residual contrast in nondilated colon. Electronically Signed   By: Nolon Nations M.D.   On: 01/15/2021 13:44   CT ABDOMEN PELVIS W CONTRAST  Result Date: 01/16/2021 CLINICAL DATA:  65 year old male status post partial colon resection and splenectomy splenectomy site seroma/hematoma. Abdominal distension. EXAM: CT ABDOMEN AND  PELVIS WITH CONTRAST TECHNIQUE: Multidetector CT imaging of the abdomen and pelvis was performed using the standard protocol following bolus administration of intravenous contrast. CONTRAST:  33mL OMNIPAQUE IOHEXOL 350 MG/ML SOLN COMPARISON:  CT Chest, Abdomen and Pelvis 01/12/2021 FINDINGS: Lower chest: Moderate left pleural effusion with simple fluid density but substantial compressive atelectasis of the left lower lobe has not regressed. No pericardial effusion. Right lower lobe consolidation persists, indeterminate for severe atelectasis (the parenchyma is enhancing) versus pneumonia. Middle lobes remain aerated. Hepatobiliary: Stable, negative. Pancreas: The pancreas remains within normal limits, although there is inflammation at the tail of the pancreas from the splenic region. Spleen: Splenectomy with complex mixed density and the larger  component partially rim enhancing fluid collection in the left upper quadrant at the splenectomy bed. The larger component of organized collection there is 62 x 83 by 63 mm for an estimated volume of 162 mL. A smaller rim enhancing component is anterior and lateral to the dominant collection as seen on series 3, image 23. Associated mass effect on the gastric cardia and proximal greater curve, and some of the fluid is inseparable from the gastric serosa (series 3, image 19). No gas within the collections. Adrenals/Urinary Tract: Urinary bladder partially obscured by streak artifact from the hip arthroplasty. Stable adrenal glands and kidneys. Symmetric renal enhancement and early contrast excretion on the delayed images. Stomach/Bowel: Oral contrast throughout the residual large bowel, which appears negative from the hepatic flexure distally. Right lower quadrant percutaneous drain with regional mesenteric inflammation, mild architectural distortion at the cecum. The small discontiguous rim enhancing fluid collections in the lower small bowel mesentery at the pelvic inlet persist and are rim enhancing today on series 3, image 68, but have not increased in size. Collectively these are less than 10 mL. Only mild inflammation of the distal small bowel now. No dilated small bowel. Duodenal diverticulum redemonstrated. Stable ventral midline abdominal postoperative changes. Mass effect and architectural distortion on the stomach is described with the spleen. No free air identified. Vascular/Lymphatic: Aortoiliac calcified atherosclerosis. Major arterial structures in the abdomen and pelvis remain patent. Portal venous system is patent. No lymphadenopathy identified. Reproductive: Negative. Other: No pelvic free fluid. Musculoskeletal: Advanced lumbar spine degeneration. Right hip arthroplasty. No acute osseous abnormality identified. IMPRESSION: 1. Splenectomy with organizing complex fluid collections in the left upper  quadrant. The largest collection there is 162 mL. Smaller adjacent collections, some inseparable from the gastric serosa and with associated mass effect and architectural distortion on the stomach. Again favor hematoma/seroma. Abscess is not excluded, but there is no gas within the collections. 2. Stable small discontiguous rim enhancing fluid collections in the lower small bowel mesentery at the pelvic inlet a short distance from the percutaneous drain. These have not significantly changed and collectively are less than 10 mL. 3. Moderate left pleural effusion with substantial left lower lobe atelectasis persists. Right lower lobe consolidation persists, indeterminate for atelectasis versus pneumonia. 4. No bowel obstruction or new abnormality identified in the abdomen or pelvis. 5. Aortic Atherosclerosis (ICD10-I70.0). Electronically Signed   By: Genevie Ann M.D.   On: 01/16/2021 04:34   DG Chest Port 1 View  Result Date: 01/16/2021 CLINICAL DATA:  Shortness of breath.  Asthma. EXAM: PORTABLE CHEST 1 VIEW COMPARISON:  01/14/2020 FINDINGS: Left arm PICC is been placed with the tip in the mid right atrium. Withdraw 4 cm to be above the right atrium if desired. Artifact overlies the chest otherwise. Hazy opacity overlying the left chest probably relates  to a layering effusion with associated dependent atelectasis. Mild atelectasis present in the right lower lobe. IMPRESSION: Left arm PICC tip in the right atrium. Withdraw 4 cm be above the right atrium if desired. Layering pleural effusion on the left with atelectatic changes of the left lung. Lesser patchy atelectasis at the right lung base. Electronically Signed   By: Nelson Chimes M.D.   On: 01/16/2021 08:38     LOS: 15 days   Signature  Lala Lund M.D on 01/16/2021 at 10:58 AM   -  To page go to [email or web address]

## 2021-01-16 NOTE — Progress Notes (Signed)
Patient becoming more confused and unable to reorient patient. Patient also became combative while trying to stop patient from climbing out of bed.  Travis [name] also has a low grade fever but is also receiving scheduled tylenol PO around the clock.  Dr Tonie Griffith made aware of patient's mentation decline.  Orders to be entered by provider.

## 2021-01-16 NOTE — Progress Notes (Signed)
Per chest xray PICC tip could be withdrawn 4cm . PICC was placed using ECG with a tip confirmation strip in images . Per our policy we will leave PICC as is .

## 2021-01-16 NOTE — Consult Note (Signed)
[place] Nurse wound follow up Patient receiving care in [facility] [id number] Wound type: Midline abdominal surgical incision Pressure Injury POA: NA  Wound bed: pink granulation tissue. Friable. Bleeding when dressing removed.  Drainage (amount, consistency, odor) Serosanguinous in canister Periwound: Intact Dressing procedure/placement/frequency: 2 pieces of black foam removed. 2 pieces of black foam used to fill the wound bed which extends down to the pubic hair line. Medium size Kellie Simmering # [phone]) Drape applied and immediate suction obtained at 125 mmHg. Canister Kellie Simmering # [phone]). Use NS to loosen foam and premedicate prior to dressing change. Very painful for the patient.  M/W/F vac change. WOC will follow.   Cathlean Marseilles [name], MSN, RN, [place], Lysle Pearl, [facility] Wound Treatment Associate Pager [phone]

## 2021-01-16 NOTE — Progress Notes (Signed)
Physical Therapy Treatment Patient Details Name: Travis [name] MRN: [id number] DOB: September 13, 1955 Today's Date: 01/16/2021   History of Present Illness Pt is a 65 y/o male admitted 10/9 with complaints of R lower quadrant abdominal pain. Found with intra-abdominal sepsis with perforated appendix and abdominal abscess; s/p diagnostic laparoscopy, ileocecectomy, drainage of interaabdominal abscess 10/9.  Post op ileus. 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 Pt self extubated 10/16. PMHx: CHF, CKD, COPD, TIA, diabetes, HTN, gout, CAD.    PT Comments    Pt progressing towards all goals. Pt easily fatigued with low activity tolerance. Pt's RR increases to 40s during activity with noted SOB and DOE of 4/4 requiring freq rest breaks. Pt gave great effort despite lethargy and pain. Pt to continue to benefit from SNF upon d/c to allow for increased time to achieve supervision level of function for safe transition home. Acute PT to cont to follow.    Recommendations for follow up therapy are one component of a multi-disciplinary discharge planning process, led by the attending physician.  Recommendations may be updated based on patient status, additional functional criteria and insurance authorization.  Follow Up Recommendations  Skilled nursing-short term rehab (<3 hours/day)     Assistance Recommended at Discharge Frequent or constant Supervision/Assistance  Equipment Recommendations  Rolling walker (2 wheels)    Recommendations for Other Services       Precautions / Restrictions Precautions Precautions: Fall Precaution Comments: wound vac and drain in abdomen Restrictions Weight Bearing Restrictions: No     Mobility  Bed Mobility Overal bed mobility: Needs Assistance Bed Mobility: Rolling;Sidelying to Sit Rolling: Mod assist Sidelying to sit: Mod assist       General bed mobility comments: max directional veral and tactile cues to complete rolling to the L, modA for  trunk elevation and to bring LEs off EOB    Transfers Overall transfer level: Needs assistance Equipment used: 2 person hand held assist Transfers: Sit to/from [facility] Sit to Stand: Mod assist;+2 physical assistance Stand pivot transfers: Mod assist;+2 physical assistance         General transfer comment: pt with noted increased RR to 40s, very fatigued, required bed to be elevated and modAx2 to power up into standing    Ambulation/Gait             General Gait Details: unable this date due to fatigue and inability to tolerate, RR into 40s during activity with noted SOB, pt did complete 3 sit to stands   Chief Strategy Officer    Modified Rankin (Stroke Patients Only)       Balance Overall balance assessment: Needs assistance Sitting-balance support: Feet supported;Bilateral upper extremity supported Sitting balance-Leahy Scale: Fair Sitting balance - Comments: leaning back on EOB   Standing balance support: Bilateral upper extremity supported Standing balance-Leahy Scale: Poor Standing balance comment: UE support wtih A for balance                            Cognition Arousal/Alertness: Lethargic Behavior During Therapy: Flat affect Overall Cognitive Status: No family/caregiver present to determine baseline cognitive functioning                     Current Attention Level: Sustained   Following Commands: Follows one step commands with increased time   Awareness: Emergent (asked to go to the bathroom, had a hard  time understanding the condom cath and tried to get up and go to bathroom but ultimately trusted the condom cath) Problem Solving: Slow processing;Decreased initiation;Requires verbal cues;Requires tactile cues General Comments: pt flat, soft spoken and lethargic however did provide excellent effort during PT session and was able to follow simple commands        Exercises       General Comments General comments (skin integrity, edema, etc.): SpO2 >88% on RA, RR at 40s during activity, HR in 120s with activity, noted SOB, DOE 4/4      Pertinent Vitals/Pain Pain Assessment: Faces Faces Pain Scale: Hurts little more Pain Location: stomach Pain Descriptors / Indicators: Discomfort;Guarding;Operative site guarding;Moaning Pain Intervention(s): Monitored during session    Home Living                          Prior Function            PT Goals (current goals can now be found in the care plan section) Acute Rehab PT Goals Patient Stated Goal: none stated Progress towards PT goals: Progressing toward goals    Frequency    Min 3X/week      PT Plan Current plan remains appropriate    Co-evaluation              AM-PAC PT "6 Clicks" Mobility   Outcome Measure  Help needed turning from your back to your side while in a flat bed without using bedrails?: A Lot Help needed moving from lying on your back to sitting on the side of a flat bed without using bedrails?: A Lot Help needed moving to and from a bed to a chair (including a wheelchair)?: A Lot Help needed standing up from a chair using your arms (e.g., wheelchair or bedside chair)?: A Lot Help needed to walk in hospital room?: Total Help needed climbing 3-5 steps with a railing? : Total 6 Click Score: 10    End of Session   Activity Tolerance: Patient limited by fatigue Patient left: in chair;with call bell/phone within reach;with chair alarm set Nurse Communication: Mobility status;Need for lift equipment PT Visit Diagnosis: Difficulty in walking, not elsewhere classified (R26.2);Other abnormalities of gait and mobility (R26.89);Muscle weakness (generalized) (M62.81);Pain     Time: 0940-1009 PT Time Calculation (min) (ACUTE ONLY): 29 min  Charges:  $Therapeutic Activity: 23-37 mins                     Kittie Plater, PT, DPT Acute Rehabilitation Services Pager #:  [phone] Office #: [phone]    Travis [name] 01/16/2021, 11:24 AM

## 2021-01-16 NOTE — Progress Notes (Signed)
Pt has PRN Bipap orders. Pt resting comfortably on 2L [place]. No distress noted at this time.

## 2021-01-16 NOTE — Progress Notes (Signed)
Notified by RN that pt is more agitated than he was last night. Has distended abdomen. Has had tachycardia since yesterday.  Had perforated appendix with abscess. Completed zosyn on 01/12/21. WBC was elevated on 01/14/21.   Check Lactic Acid, CBC, CMP Check CT abdomen and pelvis to ensure abscess has not recurred.

## 2021-01-16 NOTE — Progress Notes (Signed)
PHARMACY - TOTAL PARENTERAL NUTRITION CONSULT NOTE   Indication: Prolonged ileus  Patient Measurements: Height: 6' (182.9 cm) Weight: 85.7 kg (188 lb 15 oz) IBW/kg (Calculated) : 77.6   Body mass index is 25.62 kg/m.  Assessment: 83 yom presenting 10/9 with acute perforated appendix, and abdominal abscess s/p OR for ex-lap, ilececectomy, and drainage of abscess on 10/9. Also had splenic laceration, s/p ex-lap, splenectomy, and wound vac application 76/19. Transferred to ICU on 10/12 with septic vs. hemorrhagic shock, intubated and on vasopressor support. Plan was initially to transition to comfort measures 10/14; however improved overnight with decreasing vasopressor requirement. Post-op ileus. Pharmacy consulted to start TPN.  Of note, patient is more confused and combative today with distended abdomen. CT abd was ordered.   Glucose / Insulin: hx DM2 (no meds documented PTA). CBGs/12h 181-256 in past 24 hours with 63 units of rSSI + 85 units of regular insulin in TPN bag.  Electrolytes: Na 136, K 4. (goal >/=4 with ileus), Cl/HCO3 wnl,, CoCa 10.3 (Ca removed on 10/17), Phos 4.7, Mg 2.1 (10/21) Renal: AKI on CKD worse with SCr up to 1.43, BUN up 43 Hepatic: LFTs wnl. TG 203 (10/19) - will watch. Tbili down to 0.8, albumin 1.7, TG 139 Intake / Output; MIVF: UOP okay, NGT removed 10/20 JP drain output 30 cc,  LBM 10/21 GI Imaging:  10/21 CXR: 2.4 cc fluid collection in RLQ near cecal resection site, post-op seroma/hematoma at splenectomy site 10/24 CT abd: Fluid collections in LUQ. Largest collection is 162 mL. Hematoma vs. Abscess. No bowel obstruction   GI Surgeries / Procedures: none since TPN start  Central access: CVC triple lumen 10/14 >> 10/21; PICC 10/21 >> TPN start date: 01/08/21  Nutritional Goals: Goal TPN rate is 100 mL/hr (provides 130 g of protein and 2421 kcals per day)  RD Assessment: Estimated Needs Total Energy Estimated Needs: 2400-2600 Total Protein Estimated  Needs: 125-150 grams Total Fluid Estimated Needs: > 2 L  Current Nutrition:  FLD  Boost breeze po bid (250 Kcal and 9 grams protein) - 1 charted as given 10/23 TPN   Plan:  Continue TPN at 80 ml/hr to provide 1937 kcal and 104g protein meeting ~80% of estimated needs (discussed reduction with surgery) and help with appetite stimulation Electrolytes in TPN - no changes 10/22: Na 40 meq, Mg 5 mEq/L. K 50 mEq/L, Ca 0 mEq/L, Phos to 5 mmol/L, Cl:Ac 1:2  Add standard MVI and trace elements to TPN + folic acid and thiamine with hx ETOH abuse Continue resistant SSI q4h Continue insulin R in TPN bag at 85 units with new bag hung this evening (ratio drop with change in CHO + increase due to SSI requirements) Monitor TPN labs on Mon/Thurs F/u return of bowel function, ability to initiate tube feeds and wean off TPN  Thank you for allowing pharmacy to be a part of this patient's care.  Albertina Parr, PharmD., BCPS, BCCCP Clinical Pharmacist Please refer to [facility] for unit-specific pharmacist    **Pharmacist phone directory can now be found on [email or web address] ([email or web address]).  Listed under [place].

## 2021-01-17 ENCOUNTER — Inpatient Hospital Stay (HOSPITAL_COMMUNITY): Payer: Medicare HMO

## 2021-01-17 DIAGNOSIS — Z7189 Other specified counseling: Secondary | ICD-10-CM | POA: Diagnosis not present

## 2021-01-17 DIAGNOSIS — Z515 Encounter for palliative care: Secondary | ICD-10-CM | POA: Diagnosis not present

## 2021-01-17 LAB — CBC
HCT: 23.7 % — ABNORMAL LOW (ref 39.0–52.0)
Hemoglobin: 7.4 g/dL — ABNORMAL LOW (ref 13.0–17.0)
MCH: 30 pg (ref 26.0–34.0)
MCHC: 31.2 g/dL (ref 30.0–36.0)
MCV: 96 fL (ref 80.0–100.0)
Platelets: 584 10*3/uL — ABNORMAL HIGH (ref 150–400)
RBC: 2.47 MIL/uL — ABNORMAL LOW (ref 4.22–5.81)
RDW: 16.1 % — ABNORMAL HIGH (ref 11.5–15.5)
WBC: 18.6 10*3/uL — ABNORMAL HIGH (ref 4.0–10.5)
nRBC: 0.2 % (ref 0.0–0.2)

## 2021-01-17 LAB — COMPREHENSIVE METABOLIC PANEL
ALT: 36 U/L (ref 0–44)
AST: 44 U/L — ABNORMAL HIGH (ref 15–41)
Albumin: 1.8 g/dL — ABNORMAL LOW (ref 3.5–5.0)
Alkaline Phosphatase: 230 U/L — ABNORMAL HIGH (ref 38–126)
Anion gap: 12 (ref 5–15)
BUN: 52 mg/dL — ABNORMAL HIGH (ref 8–23)
CO2: 26 mmol/L (ref 22–32)
Calcium: 9.2 mg/dL (ref 8.9–10.3)
Chloride: 99 mmol/L (ref 98–111)
Creatinine, Ser: 1.8 mg/dL — ABNORMAL HIGH (ref 0.61–1.24)
GFR, Estimated: 41 mL/min — ABNORMAL LOW (ref >60)
Glucose, Bld: 205 mg/dL — ABNORMAL HIGH (ref 70–99)
Potassium: 4.8 mmol/L (ref 3.5–5.1)
Sodium: 137 mmol/L (ref 135–145)
Total Bilirubin: 1.1 mg/dL (ref 0.3–1.2)
Total Protein: 6.4 g/dL — ABNORMAL LOW (ref 6.5–8.1)

## 2021-01-17 LAB — PROTEIN, PLEURAL OR PERITONEAL FLUID: Total protein, fluid: 3.6 g/dL

## 2021-01-17 LAB — BODY FLUID CELL COUNT WITH DIFFERENTIAL
Eos, Fluid: 2 %
Lymphs, Fluid: 37 %
Monocyte-Macrophage-Serous Fluid: 14 % — ABNORMAL LOW (ref 50–90)
Neutrophil Count, Fluid: 47 % — ABNORMAL HIGH (ref 0–25)
Total Nucleated Cell Count, Fluid: 370 cu mm (ref 0–1000)

## 2021-01-17 LAB — GLUCOSE, CAPILLARY
Glucose-Capillary: 152 mg/dL — ABNORMAL HIGH (ref 70–99)
Glucose-Capillary: 160 mg/dL — ABNORMAL HIGH (ref 70–99)
Glucose-Capillary: 172 mg/dL — ABNORMAL HIGH (ref 70–99)
Glucose-Capillary: 187 mg/dL — ABNORMAL HIGH (ref 70–99)
Glucose-Capillary: 191 mg/dL — ABNORMAL HIGH (ref 70–99)
Glucose-Capillary: 201 mg/dL — ABNORMAL HIGH (ref 70–99)

## 2021-01-17 LAB — LACTATE DEHYDROGENASE, PLEURAL OR PERITONEAL FLUID: LD, Fluid: 298 U/L — ABNORMAL HIGH (ref 3–23)

## 2021-01-17 LAB — PROCALCITONIN: Procalcitonin: 0.8 ng/mL

## 2021-01-17 LAB — LACTIC ACID, PLASMA: Lactic Acid, Venous: 1.5 mmol/L (ref 0.5–1.9)

## 2021-01-17 LAB — LACTATE DEHYDROGENASE: LDH: 239 U/L — ABNORMAL HIGH (ref 98–192)

## 2021-01-17 LAB — BRAIN NATRIURETIC PEPTIDE: B Natriuretic Peptide: 74.1 pg/mL (ref 0.0–100.0)

## 2021-01-17 MED ORDER — FENTANYL CITRATE (PF) 100 MCG/2ML IJ SOLN
INTRAMUSCULAR | Status: AC
  Filled 2021-01-17: qty 4

## 2021-01-17 MED ORDER — LACTATED RINGERS IV BOLUS
500.0000 mL | Freq: Once | INTRAVENOUS | Status: AC
  Administered 2021-01-17: 500 mL via INTRAVENOUS

## 2021-01-17 MED ORDER — TRAVASOL 10 % IV SOLN
INTRAVENOUS | Status: AC
  Filled 2021-01-17 (×2): qty 1036.8

## 2021-01-17 MED ORDER — SODIUM CHLORIDE 0.9 % IV SOLN
3.0000 g | Freq: Three times a day (TID) | INTRAVENOUS | Status: DC
  Administered 2021-01-17 – 2021-01-19 (×6): 3 g via INTRAVENOUS
  Filled 2021-01-17 (×6): qty 8

## 2021-01-17 MED ORDER — IBUPROFEN 400 MG PO TABS
400.0000 mg | ORAL_TABLET | Freq: Once | ORAL | Status: AC | PRN
  Administered 2021-01-17: 400 mg via ORAL
  Filled 2021-01-17: qty 1

## 2021-01-17 MED ORDER — HEPARIN SODIUM (PORCINE) 5000 UNIT/ML IJ SOLN
5000.0000 [IU] | Freq: Three times a day (TID) | INTRAMUSCULAR | Status: DC
  Administered 2021-01-18 – 2021-02-02 (×46): 5000 [IU] via SUBCUTANEOUS
  Filled 2021-01-17 (×46): qty 1

## 2021-01-17 MED ORDER — MIDAZOLAM HCL 2 MG/2ML IJ SOLN
INTRAMUSCULAR | Status: AC
  Filled 2021-01-17: qty 4

## 2021-01-17 MED ORDER — LACTATED RINGERS IV SOLN: INTRAVENOUS | Status: DC

## 2021-01-17 MED ORDER — SODIUM CHLORIDE 0.9% FLUSH
5.0000 mL | Freq: Three times a day (TID) | INTRAVENOUS | Status: DC
  Administered 2021-01-17 – 2021-01-19 (×5): 5 mL

## 2021-01-17 MED ORDER — LIDOCAINE HCL (PF) 1 % IJ SOLN
INTRAMUSCULAR | Status: AC
  Filled 2021-01-17: qty 30

## 2021-01-17 MED ORDER — FENTANYL CITRATE (PF) 100 MCG/2ML IJ SOLN
INTRAMUSCULAR | Status: AC | PRN
  Administered 2021-01-17: 50 ug via INTRAVENOUS
  Administered 2021-01-17: 25 ug via INTRAVENOUS

## 2021-01-17 MED ORDER — MIDAZOLAM HCL 2 MG/2ML IJ SOLN
INTRAMUSCULAR | Status: AC | PRN
  Administered 2021-01-17: .5 mg via INTRAVENOUS
  Administered 2021-01-17: 1 mg via INTRAVENOUS

## 2021-01-17 NOTE — Progress Notes (Signed)
OT Cancellation Note  Patient Details Name: Travis [name] MRN: [id number] DOB: 1955/10/03   Cancelled Treatment:    Reason Eval/Treat Not Completed: Patient at procedure or test/ unavailable Lodema Hong, [place]  Pager [phone] Office [place] 01/17/2021, 3:09 PM

## 2021-01-17 NOTE — Procedures (Signed)
Interventional Radiology Procedure Note  Procedure:  1.) Left thoracentesis yielding 600 mL serosanguinous fluid 2.) LUQ 17F drain placed yielding thick dark bloody fluid, looks like hematoma.   Complications: None  Estimated Blood Loss: None  Recommendations: - Drain to JP - Flush daily   Signed,  [name], MD

## 2021-01-17 NOTE — Progress Notes (Signed)
PROGRESS NOTE        PATIENT DETAILS Name: Travis [name] Age: 65 y.o. Sex: male Date of Birth: August 31, 1955 Admit Date: 01/01/2021 Admitting Physician Georganna Skeans, MD TKZ:[id number], Alyson Locket, NP  Brief Narrative: Patient is a 65 y.o. male with history of HFrEF, CKD stage III, COPD, HTN, DM-2, HLD, nonobstructive CAD-who presented with abdominal pain-found to have sepsis from ruptured appendicitis-underwent exploratory laparotomy/ileocecectomy and drainage of intra-abdominal abscess.Patient was briefly managed in the ICU-unfortunately-on 10/14 he developed severe abdominal pain -he was then found to have hemorrhagic shock due to a spontaneous splenic rupture-he was reevaluated by general surgery and underwent a splenectomy.  He was readmitted to the ICU-remained on the ventilator-he self extubated on 10/16-he was monitored closely-upon further stability-transfer to the [facility] hospitalist service on 10/20.  Significant events: 10/09>>admit- ex lap, remained intubated post op-to ICU 10/10>>extubated 10/12>> transferred to [facility] 10/14>>hypotensive/abd pain-CT showed spleenc rupture-to OR for splenectomy. 10/16>> Self-extubated  10/18>> Increased O2 requirement to 8L 10/19>> O2 weaned to 4L 10/20>> transferred to [facility]    Radiology/Echo 10/09>> CT abdomen: Acute appendicitis with perforation 10/14>> CT abdomen: Splenic rupture with large splenic hematoma 10/15>> TTE: EF 45-50% 10/20>> CT abdomen/pelvis: Indeterminate 2.4 cm fluid collection in the right lower quadrant adjacent to the cecal resection site.  Moderate left pleural effusion with compressive atelectasis. 10/24 >> CT - 1. Splenectomy with organizing complex fluid collections in the left upper quadrant. The largest collection there is 162 mL. Smaller adjacent collections, some inseparable from the gastric serosa and with associated mass effect and architectural distortion on the stomach. Again favor  hematoma/seroma. Abscess is not excluded, but there is no gas within the collections. 2. Stable small discontiguous rim enhancing fluid collections in the lower small bowel mesentery at the pelvic inlet a short distance from the percutaneous drain. These have not significantly changed and collectively are less than 10 mL    Subjective: Patient in bed denies any headache, mild abdominal pain, no shortness of breath he says he feels good but looks quite drained and frail today.    Objective: Vitals: Blood pressure 111/65, pulse 94, temperature 98.2 F (36.8 C), temperature source Oral, resp. rate 17, height 6' (1.829 m), weight 85.7 kg, SpO2 97 %.   Exam:  Awake Alert, No new F.N deficits, appears more frail and weak today [place].AT,PERRAL Supple Neck, No JVD,   Symmetrical Chest wall movement, Good air movement bilaterally, CTAB RRR,No Gallops, Rubs or new Murmurs,  +ve B.Sounds, Abd Soft, No tenderness, wound VAC and abdominal drain in place No Cyanosis, Clubbing or edema,         Assessment/Plan:   Sepsis due to acute appendicitis with perforation/intra-abdominal abscesses and Bacteroides bacteremia (s/p laparotomy/ileocecectomy and drainage of intra-abdominal abscess on 10/9): Sepsis physiology has resolved-Zosyn was discontinued on 10/20.  On clear liquid diet + TNA.  CT abdomen on 10/24  noted -this discussed with general surgery in detail on 01/17/2021 since he is still spiking intermittent fevers General surgery will talk to IR to see if placing another drain for residual fluid collection in the abdomen is appropriate on 01/17/2021.  Resume Unasyn on 01/17/2021 covering both for intra-abdominal infection and possible pneumonia on x-ray.  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  Acute hypoxic respiratory failure due to atelectasis/pleural  effusion: Self extubated on 10/16-stable on anywhere from 2-4 L of oxygen-did develop some transient shortness of breath on 10/20-which responded to bronchodilators/diuretics.    Patient appears to have a moderate-sized left-sided pleural effusion which is likely transudative from-HFrEF/reactive to splenectomy.  PRN Lasix, also IR to do ultrasound-guided thoracentesis on 01/17/2021 I do not expect this to be parapneumonic or infectious .   Acute metabolic encephalopathy: Rapidly improving-continue supportive care.  AKI on CKD stage IIIa: AKI hemodynamically mediated-hold Lasix, IVF repeat on 01/17/2021 and monitor.  HTN: BP stable-continue Coreg-losartan remains on hold  Nonischemic cardiomyopathy/HFrEF: Not in exacerbation-continue Lasix and monitor volume status closely.  Follow electrolytes.   Nonobstructive CAD: No anginal symptoms-already on beta-blocker-we will resume aspirin/statin over the next few days when more stable.  DM-2 (A1c 6.4 on 06/02/2020): CBGs stable-continue SSI  Recent Labs    01/17/21 0422 01/17/21 0758 01/17/21 1202  GLUCAP 191* 187* 201*     HLD: Resume statin/fenofibrate when oral intake was stable.  COPD: Stable-continue bronchodilators  Functional quadriplegia/debility/deconditioning: Due to acute illness-continue PT/OT eval-either SNF or LTAC on discharge.  Other issues: Has subclavian TLC since 10/14-this was removed on 10/21-he now has a PICC line through which TNA is infusing.  Nutrition Status: Nutrition Problem: Moderate Malnutrition Etiology: acute illness, chronic illness (perforated appendix, CAD, cardiomyopathy) Signs/Symptoms: mild muscle depletion, mild fat depletion Interventions: TPN  Obesity: Estimated body mass index is 25.62 kg/m as calculated from the following:   Height as of this encounter: 6' (1.829 m).   Weight as of this encounter: 85.7 kg.    Procedures: 10/9>>exploratory laparotomy, ileocecectomy, drainage of  intraabdominal abscess  10/14>> exploratory laparotomy and splenectomy 10/14>> left subclavian triple-lumen catheter 01/17/2021  >>scheduled for left ultrasound-guided thoracentesis and abdominal cavity drain placement by IR  Consults: General surgery, PCCM DVT Prophylaxis: Heparin Code Status:DNR Family Communication: Sister-in-law at bedside on 01/15/21.  Niece [name] [phone] over the phone on 01/17/2021  Time spent: 35 minutes-Greater than 50% of this time was spent in counseling, explanation of diagnosis, planning of further management, and coordination of care.  Diet: Diet Order             Diet NPO time specified  Diet effective now                    Disposition Plan: Status is: Inpatient  Remains inpatient appropriate because: Requires inpatient level of care.   Barriers to Discharge: Perforated appendix with pelvic abscess/splenic rupture-s/p laparotomy/splenectomy-not yet stable for discharge.     MEDICATIONS: Scheduled Meds:  acetaminophen  1,000 mg Oral Q6H   Chlorhexidine Gluconate Cloth  6 each Topical Daily   feeding supplement  237 mL Oral TID BM   [START ON 01/18/2021] heparin injection (subcutaneous)  5,000 Units Subcutaneous Q8H   insulin aspart  0-20 Units Subcutaneous Q4H   mouth rinse  15 mL Mouth Rinse BID   methocarbamol  500 mg Oral QID   metoprolol tartrate  50 mg Oral BID   mirtazapine  7.5 mg Oral QHS   oxyCODONE  20 mg Oral Q12H   pantoprazole sodium  40 mg Oral Daily   sodium chloride flush  3 mL Intravenous Q12H   Continuous Infusions:  sodium chloride     lactated ringers     lactated ringers     TPN ADULT (ION) 80 mL/hr at 01/17/21 0131   TPN ADULT (ION)     PRN Meds:.Place/Maintain arterial line **  AND** sodium chloride, dextromethorphan, HYDROmorphone (DILAUDID) injection, hydroxypropyl methylcellulose / hypromellose, ipratropium-albuterol, ondansetron (ZOFRAN) IV, oxyCODONE, phenol, sodium chloride flush   I have  personally reviewed following labs and imaging studies  LABORATORY DATA:  Recent Labs  Lab 01/13/21 0502 01/14/21 0341 01/15/21 0425 01/16/21 0522 01/17/21 0424  WBC 19.4* 15.7* 16.0* 19.5* 18.6*  HGB 8.1* 8.0* 7.6* 7.7* 7.4*  HCT 25.5* 24.8* 23.5* 24.2* 23.7*  PLT 525* 542* 552* 565* 584*  MCV 96.6 95.8 95.1 94.9 96.0  MCH 30.7 30.9 30.8 30.2 30.0  MCHC 31.8 32.3 32.3 31.8 31.2  RDW 16.2* 16.0* 15.9* 15.8* 16.1*    Recent Labs  Lab 01/11/21 0419 01/12/21 0416 01/13/21 0502 01/14/21 0341 01/16/21 0522 01/16/21 0536 01/17/21 0424  NA 136 141 135 135 136  --  137  K 3.7 4.2 4.3 4.4 4.0  --  4.8  CL 107 105 104 100 97*  --  99  CO2 23 26 26 27 29   --  26  GLUCOSE 188* 191* 238* 296* 201*  --  205*  BUN 20 23 24* 30* 43*  --  52*  CREATININE 0.98 1.09 0.93 1.09 1.43*  --  1.80*  CALCIUM 8.6* 8.9 8.9 9.0 8.9  --  9.2  AST  --  20  --   --  27  --  44*  ALT  --  16  --   --  25  --  36  ALKPHOS  --  140*  --   --  171*  --  230*  BILITOT  --  0.8  --   --  1.1  --  1.1  ALBUMIN  --  1.5*  --   --  1.7*  --  1.8*  MG 1.9 1.7 2.1  --  2.1  --   --   PROCALCITON  --   --   --   --  0.65  --  0.80  LATICACIDVEN  --   --   --   --   --  0.9 1.5  BNP  --   --   --   --  61.3  --  74.1          RADIOLOGY STUDIES/RESULTS: DG Chest 1 View  Result Date: 01/17/2021 CLINICAL DATA:  Shortness of breath. EXAM: CHEST  1 VIEW COMPARISON:  January 16, 2021. FINDINGS: Stable cardiomediastinal silhouette. Left-sided PICC line is unchanged in position. Large left lung opacity is noted concerning for pneumonia or atelectasis with associated pleural effusion. Right basilar subsegmental atelectasis is noted. Status post right shoulder arthroplasty. IMPRESSION: Large left lung opacity is again noted concerning for pneumonia or atelectasis with associated pleural effusion. Electronically Signed   By: Marijo Conception M.D.   On: 01/17/2021 09:30   DG Abd 1 View  Result Date:  01/17/2021 CLINICAL DATA:  Abdominal pain. EXAM: ABDOMEN - 1 VIEW COMPARISON:  January 15, 2021. FINDINGS: The bowel gas pattern is normal. No radio-opaque calculi or other significant radiographic abnormality are seen. IMPRESSION: Negative. Electronically Signed   By: Marijo Conception M.D.   On: 01/17/2021 09:31   DG Abd 1 View  Result Date: 01/15/2021 CLINICAL DATA:  Abdominal pain. EXAM: ABDOMEN - 1 VIEW COMPARISON:  01/10/2021 FINDINGS: Nasogastric 2 has been withdrawn or removed. Bowel gas pattern is nonobstructive. Residual contrast is identified in nondilated loops of colon. Moderate degenerative changes in the lumbar spine. Remote RIGHT hip arthroplasty. IMPRESSION: Nonobstructive bowel gas pattern. Residual contrast in  nondilated colon. Electronically Signed   By: Nolon Nations M.D.   On: 01/15/2021 13:44   CT ABDOMEN PELVIS W CONTRAST  Result Date: 01/16/2021 CLINICAL DATA:  65 year old male status post partial colon resection and splenectomy splenectomy site seroma/hematoma. Abdominal distension. EXAM: CT ABDOMEN AND PELVIS WITH CONTRAST TECHNIQUE: Multidetector CT imaging of the abdomen and pelvis was performed using the standard protocol following bolus administration of intravenous contrast. CONTRAST:  28mL OMNIPAQUE IOHEXOL 350 MG/ML SOLN COMPARISON:  CT Chest, Abdomen and Pelvis 01/12/2021 FINDINGS: Lower chest: Moderate left pleural effusion with simple fluid density but substantial compressive atelectasis of the left lower lobe has not regressed. No pericardial effusion. Right lower lobe consolidation persists, indeterminate for severe atelectasis (the parenchyma is enhancing) versus pneumonia. Middle lobes remain aerated. Hepatobiliary: Stable, negative. Pancreas: The pancreas remains within normal limits, although there is inflammation at the tail of the pancreas from the splenic region. Spleen: Splenectomy with complex mixed density and the larger component partially rim enhancing  fluid collection in the left upper quadrant at the splenectomy bed. The larger component of organized collection there is 62 x 83 by 63 mm for an estimated volume of 162 mL. A smaller rim enhancing component is anterior and lateral to the dominant collection as seen on series 3, image 23. Associated mass effect on the gastric cardia and proximal greater curve, and some of the fluid is inseparable from the gastric serosa (series 3, image 19). No gas within the collections. Adrenals/Urinary Tract: Urinary bladder partially obscured by streak artifact from the hip arthroplasty. Stable adrenal glands and kidneys. Symmetric renal enhancement and early contrast excretion on the delayed images. Stomach/Bowel: Oral contrast throughout the residual large bowel, which appears negative from the hepatic flexure distally. Right lower quadrant percutaneous drain with regional mesenteric inflammation, mild architectural distortion at the cecum. The small discontiguous rim enhancing fluid collections in the lower small bowel mesentery at the pelvic inlet persist and are rim enhancing today on series 3, image 68, but have not increased in size. Collectively these are less than 10 mL. Only mild inflammation of the distal small bowel now. No dilated small bowel. Duodenal diverticulum redemonstrated. Stable ventral midline abdominal postoperative changes. Mass effect and architectural distortion on the stomach is described with the spleen. No free air identified. Vascular/Lymphatic: Aortoiliac calcified atherosclerosis. Major arterial structures in the abdomen and pelvis remain patent. Portal venous system is patent. No lymphadenopathy identified. Reproductive: Negative. Other: No pelvic free fluid. Musculoskeletal: Advanced lumbar spine degeneration. Right hip arthroplasty. No acute osseous abnormality identified. IMPRESSION: 1. Splenectomy with organizing complex fluid collections in the left upper quadrant. The largest collection  there is 162 mL. Smaller adjacent collections, some inseparable from the gastric serosa and with associated mass effect and architectural distortion on the stomach. Again favor hematoma/seroma. Abscess is not excluded, but there is no gas within the collections. 2. Stable small discontiguous rim enhancing fluid collections in the lower small bowel mesentery at the pelvic inlet a short distance from the percutaneous drain. These have not significantly changed and collectively are less than 10 mL. 3. Moderate left pleural effusion with substantial left lower lobe atelectasis persists. Right lower lobe consolidation persists, indeterminate for atelectasis versus pneumonia. 4. No bowel obstruction or new abnormality identified in the abdomen or pelvis. 5. Aortic Atherosclerosis (ICD10-I70.0). Electronically Signed   By: Genevie Ann M.D.   On: 01/16/2021 04:34   DG Chest Port 1 View  Result Date: 01/16/2021 CLINICAL DATA:  Shortness of breath.  Asthma. EXAM: PORTABLE CHEST 1 VIEW COMPARISON:  01/14/2020 FINDINGS: Left arm PICC is been placed with the tip in the mid right atrium. Withdraw 4 cm to be above the right atrium if desired. Artifact overlies the chest otherwise. Hazy opacity overlying the left chest probably relates to a layering effusion with associated dependent atelectasis. Mild atelectasis present in the right lower lobe. IMPRESSION: Left arm PICC tip in the right atrium. Withdraw 4 cm be above the right atrium if desired. Layering pleural effusion on the left with atelectatic changes of the left lung. Lesser patchy atelectasis at the right lung base. Electronically Signed   By: Nelson Chimes M.D.   On: 01/16/2021 08:38     LOS: 16 days   Signature  Lala Lund M.D on 01/17/2021 at 12:18 PM   -  To page go to [email or web address]

## 2021-01-17 NOTE — Progress Notes (Signed)
Chaplain attempted to follow up with patient regarding living will and health care power of attorney. Pt was not in his room. Unit secretary reports that pt is down in radiology and she is unsure how long he will be off the unit.   Please page as further needs arise.  Donald Prose. Elyn Peers, M.Div. [facility] Chaplain Pager [phone] Office [phone]

## 2021-01-17 NOTE — Progress Notes (Signed)
Patient ID: Travis [name], male   DOB: 08-May-1955, 65 y.o.   MRN: [id number] [facility] Surgery Progress Note  11 Days Post-Op  Subjective: CC-  Does not feel great today. Abdominal pain is a little worse. Nausea at times, no emesis. Tolerating liquids but did not take in much yesterday. No appetite. Passing flatus, last BM 4-5 days ago. He also reports some intermittent SOB. Denies cough or chest pain. WBC 18.6 from 19.5, TMAX 101.8  Objective: Vital signs in last 24 hours: Temp:  [97.6 F (36.4 C)-101.8 F (38.8 C)] 98.2 F (36.8 C) (10/25 0756) Pulse Rate:  [94-121] 94 (10/25 0756) Resp:  [17-30] 17 (10/25 0756) BP: (92-118)/(61-72) 111/65 (10/25 0756) SpO2:  [94 %-97 %] 97 % (10/25 0756) Last BM Date: 01/13/21  Intake/Output from previous day: 10/24 0701 - 10/25 0700 In: 380 [P.O.:60; I.V.:320] Out: 500 [Urine:500] Intake/Output this shift: No intake/output data recorded.  PE: Gen:  Alert, NAD, pleasant Card:  tachy Pulm:  CTAB, no wheezing, mild increased work of breathing Abd: Soft, mild distension, mild diffuse TTP without rebound or guarding, +BS, vac to midline incision with good seal Ext:  no BLE edema, calves soft and with mild subjective tenderness Skin: no rashes noted, warm and dry  Lab Results:  Recent Labs    01/16/21 0522 01/17/21 0424  WBC 19.5* 18.6*  HGB 7.7* 7.4*  HCT 24.2* 23.7*  PLT 565* 584*   BMET Recent Labs    01/16/21 0522 01/17/21 0424  NA 136 137  K 4.0 4.8  CL 97* 99  CO2 29 26  GLUCOSE 201* 205*  BUN 43* 52*  CREATININE 1.43* 1.80*  CALCIUM 8.9 9.2   PT/INR No results for input(s): LABPROT, INR in the last 72 hours. CMP     Component Value Date/Time   NA 137 01/17/2021 0424   NA 138 06/20/2020 1034   NA 141 04/20/2014 1019   K 4.8 01/17/2021 0424   K 3.7 04/20/2014 1019   CL 99 01/17/2021 0424   CL 106 04/20/2014 1019   CO2 26 01/17/2021 0424   CO2 30 04/20/2014 1019   GLUCOSE 205 (H) 01/17/2021 0424    GLUCOSE 102 (H) 04/20/2014 1019   BUN 52 (H) 01/17/2021 0424   BUN 18 06/20/2020 1034   BUN 14 04/20/2014 1019   CREATININE 1.80 (H) 01/17/2021 0424   CREATININE 1.18 04/20/2014 1019   CALCIUM 9.2 01/17/2021 0424   CALCIUM 9.3 04/20/2014 1019   PROT 6.4 (L) 01/17/2021 0424   PROT 6.5 06/18/2017 1425   PROT 7.4 04/20/2014 1019   ALBUMIN 1.8 (L) 01/17/2021 0424   ALBUMIN 4.0 06/18/2017 1425   ALBUMIN 3.4 04/20/2014 1019   AST 44 (H) 01/17/2021 0424   AST 21 04/20/2014 1019   ALT 36 01/17/2021 0424   ALT 20 04/20/2014 1019   ALKPHOS 230 (H) 01/17/2021 0424   ALKPHOS 97 04/20/2014 1019   BILITOT 1.1 01/17/2021 0424   BILITOT 0.4 06/18/2017 1425   BILITOT 0.6 04/20/2014 1019   GFRNONAA 41 (L) 01/17/2021 0424   GFRNONAA >60 04/20/2014 1019   GFRNONAA >60 11/19/2012 1107   GFRAA 53 (L) 01/13/2020 1410   GFRAA >60 04/20/2014 1019   GFRAA >60 11/19/2012 1107   Lipase     Component Value Date/Time   LIPASE 31 01/01/2021 1023   LIPASE 112 04/20/2014 1019       Studies/Results: DG Abd 1 View  Result Date: 01/15/2021 CLINICAL DATA:  Abdominal pain. EXAM: ABDOMEN -  1 VIEW COMPARISON:  01/10/2021 FINDINGS: Nasogastric 2 has been withdrawn or removed. Bowel gas pattern is nonobstructive. Residual contrast is identified in nondilated loops of colon. Moderate degenerative changes in the lumbar spine. Remote RIGHT hip arthroplasty. IMPRESSION: Nonobstructive bowel gas pattern. Residual contrast in nondilated colon. Electronically Signed   By: Nolon Nations M.D.   On: 01/15/2021 13:44   CT ABDOMEN PELVIS W CONTRAST  Result Date: 01/16/2021 CLINICAL DATA:  65 year old male status post partial colon resection and splenectomy splenectomy site seroma/hematoma. Abdominal distension. EXAM: CT ABDOMEN AND PELVIS WITH CONTRAST TECHNIQUE: Multidetector CT imaging of the abdomen and pelvis was performed using the standard protocol following bolus administration of intravenous contrast.  CONTRAST:  28mL OMNIPAQUE IOHEXOL 350 MG/ML SOLN COMPARISON:  CT Chest, Abdomen and Pelvis 01/12/2021 FINDINGS: Lower chest: Moderate left pleural effusion with simple fluid density but substantial compressive atelectasis of the left lower lobe has not regressed. No pericardial effusion. Right lower lobe consolidation persists, indeterminate for severe atelectasis (the parenchyma is enhancing) versus pneumonia. Middle lobes remain aerated. Hepatobiliary: Stable, negative. Pancreas: The pancreas remains within normal limits, although there is inflammation at the tail of the pancreas from the splenic region. Spleen: Splenectomy with complex mixed density and the larger component partially rim enhancing fluid collection in the left upper quadrant at the splenectomy bed. The larger component of organized collection there is 62 x 83 by 63 mm for an estimated volume of 162 mL. A smaller rim enhancing component is anterior and lateral to the dominant collection as seen on series 3, image 23. Associated mass effect on the gastric cardia and proximal greater curve, and some of the fluid is inseparable from the gastric serosa (series 3, image 19). No gas within the collections. Adrenals/Urinary Tract: Urinary bladder partially obscured by streak artifact from the hip arthroplasty. Stable adrenal glands and kidneys. Symmetric renal enhancement and early contrast excretion on the delayed images. Stomach/Bowel: Oral contrast throughout the residual large bowel, which appears negative from the hepatic flexure distally. Right lower quadrant percutaneous drain with regional mesenteric inflammation, mild architectural distortion at the cecum. The small discontiguous rim enhancing fluid collections in the lower small bowel mesentery at the pelvic inlet persist and are rim enhancing today on series 3, image 68, but have not increased in size. Collectively these are less than 10 mL. Only mild inflammation of the distal small bowel now.  No dilated small bowel. Duodenal diverticulum redemonstrated. Stable ventral midline abdominal postoperative changes. Mass effect and architectural distortion on the stomach is described with the spleen. No free air identified. Vascular/Lymphatic: Aortoiliac calcified atherosclerosis. Major arterial structures in the abdomen and pelvis remain patent. Portal venous system is patent. No lymphadenopathy identified. Reproductive: Negative. Other: No pelvic free fluid. Musculoskeletal: Advanced lumbar spine degeneration. Right hip arthroplasty. No acute osseous abnormality identified. IMPRESSION: 1. Splenectomy with organizing complex fluid collections in the left upper quadrant. The largest collection there is 162 mL. Smaller adjacent collections, some inseparable from the gastric serosa and with associated mass effect and architectural distortion on the stomach. Again favor hematoma/seroma. Abscess is not excluded, but there is no gas within the collections. 2. Stable small discontiguous rim enhancing fluid collections in the lower small bowel mesentery at the pelvic inlet a short distance from the percutaneous drain. These have not significantly changed and collectively are less than 10 mL. 3. Moderate left pleural effusion with substantial left lower lobe atelectasis persists. Right lower lobe consolidation persists, indeterminate for atelectasis versus pneumonia. 4. No bowel  obstruction or new abnormality identified in the abdomen or pelvis. 5. Aortic Atherosclerosis (ICD10-I70.0). Electronically Signed   By: Genevie Ann M.D.   On: 01/16/2021 04:34   DG Chest Port 1 View  Result Date: 01/16/2021 CLINICAL DATA:  Shortness of breath.  Asthma. EXAM: PORTABLE CHEST 1 VIEW COMPARISON:  01/14/2020 FINDINGS: Left arm PICC is been placed with the tip in the mid right atrium. Withdraw 4 cm to be above the right atrium if desired. Artifact overlies the chest otherwise. Hazy opacity overlying the left chest probably relates  to a layering effusion with associated dependent atelectasis. Mild atelectasis present in the right lower lobe. IMPRESSION: Left arm PICC tip in the right atrium. Withdraw 4 cm be above the right atrium if desired. Layering pleural effusion on the left with atelectatic changes of the left lung. Lesser patchy atelectasis at the right lung base. Electronically Signed   By: Nelson Chimes M.D.   On: 01/16/2021 08:38    Anti-infectives: Anti-infectives (From admission, onward)    Start     Dose/Rate Route Frequency Ordered Stop   01/09/21 1100  piperacillin-tazobactam (ZOSYN) IVPB 3.375 g  Status:  Discontinued        3.375 g 12.5 mL/hr over 240 Minutes Intravenous Every 8 hours 01/09/21 1000 01/12/21 0835   01/09/21 0830  ceFEPIme (MAXIPIME) 2 g in sodium chloride 0.9 % 100 mL IVPB  Status:  Discontinued        2 g 200 mL/hr over 30 Minutes Intravenous Every 12 hours 01/09/21 0737 01/09/21 1000   01/08/21 1400  metroNIDAZOLE (FLAGYL) IVPB 500 mg  Status:  Discontinued        500 mg 100 mL/hr over 60 Minutes Intravenous Every 12 hours 01/08/21 1059 01/09/21 1000   01/08/21 1400  ceFEPIme (MAXIPIME) 2 g in sodium chloride 0.9 % 100 mL IVPB  Status:  Discontinued        2 g 200 mL/hr over 30 Minutes Intravenous Every 24 hours 01/08/21 1114 01/09/21 0737   01/08/21 1000  vancomycin (VANCOREADY) IVPB 750 mg/150 mL  Status:  Discontinued        750 mg 150 mL/hr over 60 Minutes Intravenous Every 24 hours 01/07/21 2052 01/09/21 1000   01/08/21 0600  vancomycin (VANCOREADY) IVPB 1500 mg/300 mL  Status:  Discontinued        1,500 mg 150 mL/hr over 120 Minutes Intravenous Every 24 hours 01/07/21 0430 01/07/21 1210   01/08/21 0600  vancomycin (VANCOREADY) IVPB 750 mg/150 mL  Status:  Discontinued        750 mg 150 mL/hr over 60 Minutes Intravenous Every 24 hours 01/07/21 1210 01/07/21 2052   01/07/21 1400  piperacillin-tazobactam (ZOSYN) IVPB 3.375 g  Status:  Discontinued        3.375 g 12.5 mL/hr over  240 Minutes Intravenous Every 8 hours 01/07/21 0430 01/08/21 1114   01/07/21 0530  piperacillin-tazobactam (ZOSYN) IVPB 3.375 g        3.375 g 100 mL/hr over 30 Minutes Intravenous STAT 01/07/21 0430 01/07/21 0622   01/07/21 0530  vancomycin (VANCOREADY) IVPB 1750 mg/350 mL        1,750 mg 175 mL/hr over 120 Minutes Intravenous  Once 01/07/21 0430 01/07/21 0841   01/05/21 1430  Ampicillin-Sulbactam (UNASYN) 3 g in sodium chloride 0.9 % 100 mL IVPB        3 g 200 mL/hr over 30 Minutes Intravenous Every 6 hours 01/05/21 1334 01/06/21 1552   01/01/21 2200  piperacillin-tazobactam (ZOSYN)  IVPB 3.375 g        3.375 g 12.5 mL/hr over 240 Minutes Intravenous Every 8 hours 01/01/21 1841 01/04/21 2359   01/01/21 2100  piperacillin-tazobactam (ZOSYN) IVPB 3.375 g  Status:  Discontinued        3.375 g 100 mL/hr over 30 Minutes Intravenous Every 8 hours 01/01/21 1836 01/01/21 1841   01/01/21 1345  piperacillin-tazobactam (ZOSYN) IVPB 3.375 g        3.375 g 100 mL/hr over 30 Minutes Intravenous  Once 01/01/21 1337 01/01/21 1430        Assessment/Plan POD 16, s/p exploratory laparotomy, ileocecectomy, drainage of intraabdominal abscess 01/01/21 Dr. Grandville Silos for perforated appendicitis with abscess - zosyn stopped 10/20 - Drain removed 10/24 - VAC change M/W/F to midline - PT/OT rec SNF at discharge   POD 11 s/p ex lap, splenectomy, application of incisional wound vac 10/14. Dr. Bobbye Morton for splenic laceration - CT 10/24 with complex LUQ fluid collection, no clear evidence of infection. Patient became febrile over night. Will ask IR to evaluate and see if fluid collection(s) would be drainable. CT also showed a worsening left pleural effusion - will see if IR could also drain this. Blood cultures have been sent, recommend starting empiric antibiotics.   FEN: TNA, NPO VTE: SQH  ID: Zosyn 10/9>> 10/20   Below per primary team: hemorrhagic shock 2/2 above s/p 6 u PRBC, 2 u FFP 10/14 Septic shock  2/2 perforated appendicitis  EtOH abuse Alcohol related cardiomyopathy T2DM Chronic anemia Hx of TIA HTN HLD Gout Hx of GIB CAD Tobacco abuse L pleural effusion   LOS: 16 days    Wellington Hampshire, [facility] Surgery 01/17/2021, 8:35 AM Please see Amion for pager number during day hours 7:00am-4:30pm

## 2021-01-17 NOTE — Progress Notes (Signed)
Pt trggered for [place] due to elevated temp 101.8 and Hr 122. Pt denies any pain, new cough, states just feel tired. On call MD notified, chart was reviewed during call and MD arrived to beside. MD entered new orders. Advil 400mg  administered.

## 2021-01-17 NOTE — Progress Notes (Signed)
   01/16/21 2241  Assess: MEWS Score  BP 115/65  ECG Heart Rate (!) 118  Resp (!) 23  SpO2 95 %  O2 Device Room Air  Patient Activity (if Appropriate) In bed  Assess: MEWS Score  MEWS Temp 0  MEWS Systolic 0  MEWS Pulse 2  MEWS RR 1  MEWS LOC 0  MEWS Score 3  MEWS Score Color Yellow  Assess: if the MEWS score is Yellow or Red  Were vital signs taken at a resting state? Yes  Focused Assessment No change from prior assessment  Early Detection of Sepsis Score *See Row Information* Low  MEWS guidelines implemented *See Row Information* Yes  Treat  MEWS Interventions Administered scheduled meds/treatments  Pain Scale 0-10  Pain Score 0  Take Vital Signs  Increase Vital Sign Frequency  Yellow: Q 2hr X 2 then Q 4hr X 2, if remains yellow, continue Q 4hrs  Escalate  MEWS: Escalate Yellow: discuss with charge nurse/RN and consider discussing with provider and RRT  Notify: Charge Nurse/RN  Name of Charge Nurse/RN Notified Wenda Overland, RN  Date Charge Nurse/RN Notified 01/16/21  Time Charge Nurse/RN Notified 2241   Metoprolol PO given as scheduled. Patient repositioned. Respirations became labored and tachy with repositioning. O2 2L [place] applied to patient to help with this. O2 removed when respirations back to unlebored and in the 20 range, like they were previously. Patient resting in bed at this time. No distress noted, denies needs. Shift report given to Romeo Apple, RN with all of this information. Will continue to monitor.

## 2021-01-17 NOTE — Consult Note (Signed)
Chief Complaint: Patient was seen in consultation today for LUQ abscess drain placement and left thoracentesis Chief Complaint  Patient presents with   Abdominal Pain   at the request of Dr Bobbye Morton   Supervising Physician: Jacqulynn Cadet  Patient Status: [facility] - In-pt  History of Present Illness: Travis [name] is a 65 y.o. male   Pt admitted 10/9 with ruptured appendix  POD 16, s/p exploratory laparotomy, ileocecectomy, drainage of intraabdominal abscess 01/01/21 Dr. Grandville Silos for perforated appendicitis with abscess POD 11 s/p ex lap, splenectomy, application of incisional wound vac 10/14. Dr. Bobbye Morton for splenic laceration  Abd pain; fever wbc increasing  CT yesterday:  IMPRESSION: 1. Splenectomy with organizing complex fluid collections in the left upper quadrant. The largest collection there is 162 mL. Smaller adjacent collections, some inseparable from the gastric serosa and with associated mass effect and architectural distortion on the stomach. Again favor hematoma/seroma. Abscess is not excluded, but there is no gas within the collections. 2. Stable small discontiguous rim enhancing fluid collections in the lower small bowel mesentery at the pelvic inlet a short distance from the percutaneous drain. These have not significantly changed and collectively are less than 10 mL. 3. Moderate left pleural effusion with substantial left lower lobe atelectasis persists. Right lower lobe consolidation persists, indeterminate for atelectasis versus pneumonia. 4. No bowel obstruction or new abnormality identified in the abdomen or pelvis.  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 05/2019 Echo: EF 35-40%; b. 10/2019 Echo: EF 45-50%.   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 12/2010 Echo: EF 20-25%; b 08/2013 Echo: EF 45-50%; c. 01/2015 Echo: EF 25-30%; d. 07/2016 Echo: EF 35-40%; e. 05/2019 Echo: EF 35-40%; d. 10/2019 Echo: EF 45-50%, mild LVH, mild red RV fxn, mildl dil RA, Triv MR. Mild to mod Ao Sclerosis w/o stenosis.   Hyperlipidemia    Hypertension    Nonischemic cardiomyopathy (HCC)    Nonobstructive Coronary artery disease    a. 01/2015 Cath: LM nl, LAD 30p/m, LCX nl, RCA 10p/m, RPDA min irregs.   Osteoarthritis    Pneumonia    TIA (transient ischemic attack)    Tobacco abuse    Vision loss    peripherial vision only left eye.Central Retinal  artery occusion     Past Surgical History:  Procedure Laterality Date   BIOPSY  11/25/2019   Procedure: BIOPSY;  Surgeon: Ladene Artist, MD;  Location: [place];  Service: Endoscopy;;   CARDIAC CATHETERIZATION     CARDIAC CATHETERIZATION N/A 01/28/2015   Procedure: Left Heart Cath and Coronary Angiography;  Surgeon: Wellington Hampshire, MD;  Location: [place] [facility];  Service: Cardiovascular;  Laterality: N/A;   COLONOSCOPY     ESOPHAGOGASTRODUODENOSCOPY (EGD) WITH PROPOFOL N/A 11/25/2019   Procedure: ESOPHAGOGASTRODUODENOSCOPY (EGD) WITH PROPOFOL;  Surgeon: Ladene Artist, MD;  Location: [facility];  Service: Endoscopy;  Laterality: N/A;  HIP ARTHROPLASTY Right 03/15/2013   Procedure: ARTHROPLASTY BIPOLAR HIP;  Surgeon: [name], MD;  Location: [place];  Service: Orthopedics;  Laterality: Right;   LAPAROSCOPIC APPENDECTOMY N/A 01/01/2021   Procedure: DIAGNOSTIC LAPAROSCOPY; EXPLORATORY LAPAROTOMY, ILEUCECECTOMY, DRAINAGE OF ABDOMINAL ABSCESS;  Surgeon: Georganna Skeans, MD;  Location: [place];  Service: General;   Laterality: N/A;   SPLENECTOMY, TOTAL N/A 01/06/2021   Procedure: SPLENECTOMY;  Surgeon: Jesusita Oka, MD;  Location: [place];  Service: General;  Laterality: N/A;   TOTAL SHOULDER ARTHROPLASTY Right 09/01/2015   Procedure: RIGHT TOTAL SHOULDER ARTHROPLASTY;  Surgeon: [name], MD;  Location: [place];  Service: Orthopedics;  Laterality: Right;    Allergies: Patient has no known allergies.  Medications: Prior to Admission medications   Medication Sig Start Date End Date Taking? Authorizing Provider  albuterol (VENTOLIN HFA) 108 (90 Base) MCG/ACT inhaler Inhale 2 puffs into the lungs every 6 (six) hours as needed for wheezing or shortness of breath.   Yes [provider]  allopurinol (ZYLOPRIM) 100 MG tablet Take 1 tablet (100 mg total) by mouth 2 (two) times daily. 12/19/20  Yes [name], NP  aspirin 325 MG tablet Take 162.5 mg by mouth in the morning.   Yes [provider]  atorvastatin (LIPITOR) 20 MG tablet Take 20 mg by mouth daily.   Yes [provider]  carvedilol (COREG) 6.25 MG tablet Take 1 tablet (6.25 mg total) by mouth 2 (two) times daily. 06/03/20  Yes Theora Gianotti, NP  cetirizine (ZYRTEC) 10 MG tablet Take 1 tablet (10 mg total) by mouth daily. 07/07/20  Yes [name], NP  fenofibrate (TRICOR) 145 MG tablet TAKE 1 TABLET BY MOUTH ONCE A DAY 11/23/20  Yes Dutch Quint B, FNP  ferrous sulfate 325 (65 FE) MG tablet Take 1 tablet (325 mg total) by mouth daily with breakfast. 06/02/20  Yes Baity, Coralie Keens, NP  fluticasone (FLONASE) 50 MCG/ACT nasal spray Place 1-2 sprays into both nostrils daily as needed for allergies or rhinitis.   Yes [provider]  furosemide (LASIX) 40 MG tablet Take 1 tablet by mouth daily.   Yes [provider]  hydroxypropyl methylcellulose / hypromellose (ISOPTO TEARS / GONIOVISC) 2.5 % ophthalmic solution Place 1 drop into both eyes 3 (three) times daily as needed for dry eyes.   Yes  [provider]  losartan (COZAAR) 25 MG tablet Take 0.5 tablets (12.5 mg total) by mouth daily. 12/19/20 03/19/21 Yes [name], NP  magnesium oxide (MAG-OX) 400 MG tablet Take 1 tablet (400 mg total) by mouth daily. 12/19/20  Yes [name], NP  multivitamin (ONE-A-DAY MEN'S) TABS tablet Take 1 tablet by mouth daily with breakfast.   Yes [provider]  Omega-3 Fatty Acids (FISH OIL PO) Take 1 capsule by mouth daily.   Yes [provider]  pantoprazole (PROTONIX) 40 MG tablet Take 1 tablet (40 mg total) by mouth daily. 06/02/20  Yes Baity, Coralie Keens, NP  triamcinolone cream (KENALOG) 0.1 % APPLY TO AFFECTED AREAS TWICE A DAY AS NEEDED. AVOID FACE, GROIN, OR UNDERARMS. Patient taking differently: Apply 1 application topically 2 (two) times daily as needed (dry skin, rash). 11/01/20  Yes Kennyth Arnold, FNP     Family History  Problem Relation Age of Onset   Diabetes Mother    Hypertension Mother    Diabetes Father    Hypertension Father    Diabetes Sister    Dementia Brother  Cancer Neg Hx    Heart disease Neg Hx    Stroke Neg Hx    Colon cancer Neg Hx    Esophageal cancer Neg Hx    Rectal cancer Neg Hx    Stomach cancer Neg Hx     Social History   Socioeconomic History   Marital status: Single    Spouse name: Not on file   Number of children: 0   Years of education: 12   Highest education level: High school graduate  Occupational History   Occupation: Retired  Tobacco Use   Smoking status: Every Day    Packs/day: 0.75    Years: 30.00    Pack years: 22.50    Types: Cigarettes   Smokeless tobacco: Never  Vaping Use   Vaping Use: Never used  Substance and Sexual Activity   Alcohol use: Not Currently    Alcohol/week: 1.0 standard drink    Types: 1 Glasses of wine per week    Comment: occasionally   Drug use: Yes    Types: Marijuana   Sexual activity: Yes    Birth control/protection: Condom  Other Topics Concern   Not on file   Social History Narrative   Not on file   Social Determinants of Health   Financial Resource Strain: Low Risk    Difficulty of Paying Living Expenses: Not hard at all  Food Insecurity: No Food Insecurity   Worried About Charity fundraiser in the Last Year: Never true   Ran Out of Food in the Last Year: Never true  Transportation Needs: No Transportation Needs   Lack of Transportation (Medical): No   Lack of Transportation (Non-Medical): No  Physical Activity: Inactive   Days of Exercise per Week: 0 days   Minutes of Exercise per Session: 0 min  Stress: No Stress Concern Present   Feeling of Stress : Only a little  Social Connections: Socially Isolated   Frequency of Communication with Friends and Family: More than three times a week   Frequency of Social Gatherings with Friends and Family: More than three times a week   Attends Religious Services: Never   Marine scientist or Organizations: No   Attends Archivist Meetings: Never   Marital Status: Never married     Review of Systems: A 12 point ROS discussed and pertinent positives are indicated in the HPI above.  All other systems are negative.  Review of Systems  Constitutional:  Positive for activity change, fatigue and fever.  Respiratory:  Negative for cough and shortness of breath.   Cardiovascular:  Negative for chest pain.  Gastrointestinal:  Positive for abdominal pain and nausea.  Neurological:  Positive for weakness.  Psychiatric/Behavioral:  Negative for behavioral problems and confusion.    Vital Signs: BP 111/65 (BP Location: Right Arm)   Pulse 94   Temp 98.2 F (36.8 C) (Oral)   Resp 17   Ht 6' (1.829 m)   Wt 188 lb 15 oz (85.7 kg)   SpO2 97%   BMI 25.62 kg/m   Physical Exam Vitals reviewed.  HENT:     Mouth/Throat:     Mouth: Mucous membranes are moist.  Pulmonary:     Effort: Pulmonary effort is normal.     Comments: Left with dull sound Abdominal:     Tenderness: There is  abdominal tenderness in the left upper quadrant and left lower quadrant.  Skin:    General: Skin is warm.  Neurological:  Mental Status: He is alert and oriented to person, place, and time.  Psychiatric:        Behavior: Behavior normal.    Imaging: CT ABDOMEN PELVIS WO CONTRAST  Result Date: 01/06/2021 CLINICAL DATA:  Intra-abdominal abscess. EXAM: CT ABDOMEN AND PELVIS WITHOUT CONTRAST TECHNIQUE: Multidetector CT imaging of the abdomen and pelvis was performed following the standard protocol without IV contrast. COMPARISON:  01/01/2021 FINDINGS: Lower chest: Small bilateral pleural effusions. Atelectasis or consolidation in both lung bases. Mild cardiac enlargement. Small pericardial effusions. Scattered mediastinal lymph nodes are not pathologically enlarged. Enteric tube in the esophagus. Hepatobiliary: No focal liver lesions. Stone in the gallbladder. No gallbladder wall thickening or edema. No bile duct dilatation. Pancreas: Unremarkable. No pancreatic ductal dilatation or surrounding inflammatory changes. Spleen: Heterogeneous appearance of the spleen with mixed hypo and hyperechoic appearance consistent with splenic hematoma or rupture. Transverse diameter the hematoma is about 14.3 x 11.8 cm. Moderate amount of free fluid throughout the abdomen and pelvis with increased density suggesting hemorrhage, likely related to the splenic injury. Adrenals/Urinary Tract: No adrenal gland nodules. Kidneys are symmetrical. No hydronephrosis or hydroureter. Bladder is partially obscured by streak artifact but appears grossly unremarkable. Stomach/Bowel: Stomach, small bowel, and colon are not abnormally distended. An enteric tube is present with tip in the distal stomach. Since the prior study, there has been interval postoperative change with apparent partial colectomy and ileocolonic anastomosis. Stranding around the area of the anastomosis is likely postoperative or inflammatory. No loculated  collections. There is wall thickening in the pelvic loops of the terminal ileum, likely representing reactive inflammation. Enteritis of nonspecific cause would be another possibility. There is a duodenal diverticulum. Vascular/Lymphatic: Calcification of the aorta. No aneurysm. Scattered lymph nodes are present without pathologic enlargement, likely reactive. Reproductive: Prostate gland is not enlarged. Other: Free fluid in the upper abdomen and extending into the pelvis. Fluid has increased density with heterogeneous layering consistent with hemorrhagic fluid. No loculated collections. Small amount of free air in the abdomen is likely postoperative. A right lower quadrant surgical drain remains present. Open incision along the midline of the abdomen. Musculoskeletal: Degenerative changes in the spine. No displaced rib fractures are identified. Postoperative right hip arthroplasty with heterotopic ossification. IMPRESSION: 1. Apparent splenic rupture with large splenic hematoma and free fluid consistent with hemorrhage in the abdomen and pelvis. 2. Interval postoperative changes with resection of the cecum and ileal colonic anastomosis. No loculated collections. 3. Lower abdominal bowel wall thickening likely representing reactive inflammation. Enteritis would be a secondary consideration. 4. Free air in the abdomen consistent with recent surgery. Surgical drain remains present. 5. Cholelithiasis without evidence of acute cholecystitis. 6. Small bilateral pleural effusions with consolidation in both lung bases. Critical Value/emergent results were called by telephone at the time of interpretation on 01/06/2021 at 2:22 am to provider [facility] , who verbally acknowledged these results. Electronically Signed   By: Lucienne Capers M.D.   On: 01/06/2021 02:27   DG Chest 1 View  Result Date: 01/17/2021 CLINICAL DATA:  Shortness of breath. EXAM: CHEST  1 VIEW COMPARISON:  January 16, 2021. FINDINGS: Stable  cardiomediastinal silhouette. Left-sided PICC line is unchanged in position. Large left lung opacity is noted concerning for pneumonia or atelectasis with associated pleural effusion. Right basilar subsegmental atelectasis is noted. Status post right shoulder arthroplasty. IMPRESSION: Large left lung opacity is again noted concerning for pneumonia or atelectasis with associated pleural effusion. Electronically Signed   By: Bobbe Medico.D.  On: 01/17/2021 09:30   DG Abd 1 View  Result Date: 01/17/2021 CLINICAL DATA:  Abdominal pain. EXAM: ABDOMEN - 1 VIEW COMPARISON:  January 15, 2021. FINDINGS: The bowel gas pattern is normal. No radio-opaque calculi or other significant radiographic abnormality are seen. IMPRESSION: Negative. Electronically Signed   By: Marijo Conception M.D.   On: 01/17/2021 09:31   DG Abd 1 View  Result Date: 01/15/2021 CLINICAL DATA:  Abdominal pain. EXAM: ABDOMEN - 1 VIEW COMPARISON:  01/10/2021 FINDINGS: Nasogastric 2 has been withdrawn or removed. Bowel gas pattern is nonobstructive. Residual contrast is identified in nondilated loops of colon. Moderate degenerative changes in the lumbar spine. Remote RIGHT hip arthroplasty. IMPRESSION: Nonobstructive bowel gas pattern. Residual contrast in nondilated colon. Electronically Signed   By: Nolon Nations M.D.   On: 01/15/2021 13:44   DG Abd 1 View  Result Date: 01/04/2021 CLINICAL DATA:  Ileus and abdominal pain EXAM: ABDOMEN - 1 VIEW COMPARISON:  01/03/2021 FINDINGS: Enteric tube is present with tip in the right upper quadrant consistent with location in the distal stomach or possibly proximal duodenum. Nondilated gas-filled small bowel with scattered gas in the colon. Changes likely representing ileus. No radiopaque stones. Catheter in the lower abdomen may represent a drainage catheter or possibly jejunal catheter. No change in position. Degenerative changes in the spine. Previous right hip arthroplasty. Atelectasis in the  lung bases. IMPRESSION: Appliances appear in satisfactory position. Gas-filled nondilated small bowel, likely ileus. No definite evidence of obstruction. Similar appearance to previous study. Electronically Signed   By: Lucienne Capers M.D.   On: 01/04/2021 00:22   DG Abd 1 View  Result Date: 01/03/2021 CLINICAL DATA:  Nasogastric tube placement. EXAM: ABDOMEN - 1 VIEW COMPARISON:  January 01, 2021 FINDINGS: A nasogastric tube is seen with its distal tip overlying the expected region of the duodenal bulb. A few air-filled loops small bowel within the mid left abdomen appear to approach the upper limit of normal caliber. No radio-opaque calculi or other significant radiographic abnormality are seen. IMPRESSION: Nasogastric tube positioning, as described above. Electronically Signed   By: Virgina Norfolk M.D.   On: 01/03/2021 15:45   CT ABDOMEN PELVIS W CONTRAST  Result Date: 01/16/2021 CLINICAL DATA:  65 year old male status post partial colon resection and splenectomy splenectomy site seroma/hematoma. Abdominal distension. EXAM: CT ABDOMEN AND PELVIS WITH CONTRAST TECHNIQUE: Multidetector CT imaging of the abdomen and pelvis was performed using the standard protocol following bolus administration of intravenous contrast. CONTRAST:  64mL OMNIPAQUE IOHEXOL 350 MG/ML SOLN COMPARISON:  CT Chest, Abdomen and Pelvis 01/12/2021 FINDINGS: Lower chest: Moderate left pleural effusion with simple fluid density but substantial compressive atelectasis of the left lower lobe has not regressed. No pericardial effusion. Right lower lobe consolidation persists, indeterminate for severe atelectasis (the parenchyma is enhancing) versus pneumonia. Middle lobes remain aerated. Hepatobiliary: Stable, negative. Pancreas: The pancreas remains within normal limits, although there is inflammation at the tail of the pancreas from the splenic region. Spleen: Splenectomy with complex mixed density and the larger component partially  rim enhancing fluid collection in the left upper quadrant at the splenectomy bed. The larger component of organized collection there is 62 x 83 by 63 mm for an estimated volume of 162 mL. A smaller rim enhancing component is anterior and lateral to the dominant collection as seen on series 3, image 23. Associated mass effect on the gastric cardia and proximal greater curve, and some of the fluid is inseparable from the gastric serosa (  series 3, image 19). No gas within the collections. Adrenals/Urinary Tract: Urinary bladder partially obscured by streak artifact from the hip arthroplasty. Stable adrenal glands and kidneys. Symmetric renal enhancement and early contrast excretion on the delayed images. Stomach/Bowel: Oral contrast throughout the residual large bowel, which appears negative from the hepatic flexure distally. Right lower quadrant percutaneous drain with regional mesenteric inflammation, mild architectural distortion at the cecum. The small discontiguous rim enhancing fluid collections in the lower small bowel mesentery at the pelvic inlet persist and are rim enhancing today on series 3, image 68, but have not increased in size. Collectively these are less than 10 mL. Only mild inflammation of the distal small bowel now. No dilated small bowel. Duodenal diverticulum redemonstrated. Stable ventral midline abdominal postoperative changes. Mass effect and architectural distortion on the stomach is described with the spleen. No free air identified. Vascular/Lymphatic: Aortoiliac calcified atherosclerosis. Major arterial structures in the abdomen and pelvis remain patent. Portal venous system is patent. No lymphadenopathy identified. Reproductive: Negative. Other: No pelvic free fluid. Musculoskeletal: Advanced lumbar spine degeneration. Right hip arthroplasty. No acute osseous abnormality identified. IMPRESSION: 1. Splenectomy with organizing complex fluid collections in the left upper quadrant. The largest  collection there is 162 mL. Smaller adjacent collections, some inseparable from the gastric serosa and with associated mass effect and architectural distortion on the stomach. Again favor hematoma/seroma. Abscess is not excluded, but there is no gas within the collections. 2. Stable small discontiguous rim enhancing fluid collections in the lower small bowel mesentery at the pelvic inlet a short distance from the percutaneous drain. These have not significantly changed and collectively are less than 10 mL. 3. Moderate left pleural effusion with substantial left lower lobe atelectasis persists. Right lower lobe consolidation persists, indeterminate for atelectasis versus pneumonia. 4. No bowel obstruction or new abnormality identified in the abdomen or pelvis. 5. Aortic Atherosclerosis (ICD10-I70.0). Electronically Signed   By: Genevie Ann M.D.   On: 01/16/2021 04:34   CT Abdomen Pelvis W Contrast  Addendum Date: 01/01/2021   ADDENDUM REPORT: 01/01/2021 16:38 ADDENDUM: These results were called by telephone at the time of interpretation on 01/01/2021 at 4:35 pm to provider Dr. Darl Householder , Who verbally acknowledged these results. Electronically Signed   By: Fidela Salisbury M.D.   On: 01/01/2021 16:38   Result Date: 01/01/2021 CLINICAL DATA:  Abdominal pain. EXAM: CT ABDOMEN AND PELVIS WITH CONTRAST TECHNIQUE: Multidetector CT imaging of the abdomen and pelvis was performed using the standard protocol following bolus administration of intravenous contrast. CONTRAST:  186mL OMNIPAQUE IOHEXOL 300 MG/ML  SOLN COMPARISON:  None. FINDINGS: Lower chest: No acute abnormality. Hepatobiliary: No focal liver abnormality is seen. No gallstones, gallbladder wall thickening, or biliary dilatation. Pancreas: Unremarkable. No pancreatic ductal dilatation or surrounding inflammatory changes. Spleen: Normal in size without focal abnormality. Adrenals/Urinary Tract: Adrenal glands are unremarkable. Kidneys are normal, without renal  calculi, focal lesion, or hydronephrosis. Bladder is unremarkable. Stomach/Bowel: The stomach and small bowel are normal. There is circumferential mucosal thickening at the base of the appendix and the cecum at the ileocecal junction. There is also indistinctness of the bowel wall of the proximal appendix. The distal appendix is decompressed, contains gas and fecal matter and overall has normal appearance. There is small amount of free gas and free fluid at the base of appendix and surrounding the cecum. The free gas appears to originate from the base of the appendix. Inflammatory fat stranding surrounds the base of the appendix and distal cecum. Vascular/Lymphatic:  Aortic atherosclerosis. No enlarged abdominal or pelvic lymph nodes. Reproductive: Prostate is unremarkable. Other: No abdominal wall hernia or abnormality. Small amount of free fluid in the right lower quadrant of the abdomen along the pericolic gutter. Additional small foci of gas scattered in the mesentery of the right lower and upper quadrants, and in perihepatic location. Musculoskeletal: Advanced spondylosis of lower lumbosacral spine. IMPRESSION: 1. Circumferential mucosal thickening and indistinctness of the bowel wall at the base of the appendix and the distal cecum at the ileocecal junction. There is an associated milder mucosal thickening and indistinctness of the terminal ileum. The distal appendix is decompressed, contains gas and fecal matter and overall has normal appearance. Small amount of free fluid and free gas in the right lower quadrant of the abdomen. These findings are likely due to acute appendicitis with perforation. It is unclear however whether the mucosal thickening of the cecum is reactive or is the source of appendiceal obstruction. Underlying malignancy such as mucinous tumor is in the differential diagnosis. 2. Small foci of free intraperitoneal gas and small amount of fluid scattered in the mesentery of the right lower  and upper quadrants of the abdomen, and in perihepatic location. Aortic Atherosclerosis (ICD10-I70.0). Electronically Signed: By: Fidela Salisbury M.D. On: 01/01/2021 16:28   CT CHEST ABDOMEN PELVIS W CONTRAST  Result Date: 01/12/2021 CLINICAL DATA:  History of partial colon resection and splenectomy, chest pain, abdominal pain EXAM: CT CHEST, ABDOMEN, AND PELVIS WITH CONTRAST TECHNIQUE: Multidetector CT imaging of the chest, abdomen and pelvis was performed following the standard protocol during bolus administration of intravenous contrast. CONTRAST:  161mL OMNIPAQUE IOHEXOL 300 MG/ML  SOLN COMPARISON:  01/06/2021, 01/01/2021 FINDINGS: CT CHEST FINDINGS Cardiovascular: The heart is unremarkable without pericardial effusion. No evidence of thoracic aortic aneurysm or dissection. Minimal atherosclerosis. There is a left subclavian central venous catheter tip within the superior vena cava. Mediastinum/Nodes: Borderline enlarged mediastinal lymph nodes measure up to 12 mm in the precarinal region, likely reactive. Thyroid, trachea, and esophagus are unremarkable. Lungs/Pleura: There is aim moderate left pleural effusion, volume estimated less than 1 L. Dense left lower lobe atelectasis is also noted. Dependent right lower lobe atelectasis is also seen. No pneumothorax. Central airways are patent. Musculoskeletal: There are no acute or destructive bony lesions. Reconstructed images demonstrate no additional findings. CT ABDOMEN PELVIS FINDINGS Hepatobiliary: Small calcified gallstones are again identified without evidence of cholecystitis. The liver is unremarkable. No intrahepatic biliary duct dilation. Pancreas: Unremarkable. No pancreatic ductal dilatation or surrounding inflammatory changes. 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 Residual hematoma within the splenectomy bed measures 4.9 x 7.8 cm. Adrenals/Urinary Tract: The kidneys enhance normally and symmetrically. No urinary tract calculi  or obstructive uropathy. The adrenals and bladder are unremarkable. Stomach/Bowel: Postsurgical changes are seen from cecal resection with ileocolic anastomosis. No evidence of bowel obstruction or ileus. No bowel wall thickening or inflammatory change. Vascular/Lymphatic: Aortic atherosclerosis. No enlarged abdominal or pelvic lymph nodes. Reproductive: Prostate is unremarkable. Other: Postsurgical changes are seen from prior midline laparotomy. A surgical drain enters the abdomen in the right lower quadrant, tip within the left hemipelvis. A small fluid collection in the left upper quadrant adjacent to the post splenectomy hematoma measures 5.9 x 4.0 cm image 55/3, with a punctate focus of internal gas, likely representing postoperative seroma. This does not demonstrate any rim enhancement to suggest abscess. There is trace free fluid within the lower abdomen and pelvis. A small localized fluid collection in the right lower quadrant image 100/3  measures 2.4 x 1.9 cm, indeterminate at this time. This could reflect organizing phlegmon or developing abscess, and continued follow-up is recommended. Musculoskeletal: Stable right hip arthroplasty. There are no acute or destructive bony lesions. Reconstructed images demonstrate no additional findings. IMPRESSION: 1. 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 2. Indeterminate 2.4 cm fluid collection right lower quadrant adjacent to the cecal resection site. This could reflect organizing phlegmon, and continued follow-up is recommended. 3. Likely postoperative seroma and residual hematoma at the splenectomy surgical site. No evidence of abscess at this location. 4. Moderate left pleural effusion with underlying compressive left lower lobe atelectasis. 5. Cholelithiasis without cholecystitis. 6.  Aortic Atherosclerosis (ICD10-I70.0). Electronically Signed   By: Randa Ngo M.D.   On: 01/12/2021 15:49   DG Chest Port 1 View  Result Date:  01/16/2021 CLINICAL DATA:  Shortness of breath.  Asthma. EXAM: PORTABLE CHEST 1 VIEW COMPARISON:  01/14/2020 FINDINGS: Left arm PICC is been placed with the tip in the mid right atrium. Withdraw 4 cm to be above the right atrium if desired. Artifact overlies the chest otherwise. Hazy opacity overlying the left chest probably relates to a layering effusion with associated dependent atelectasis. Mild atelectasis present in the right lower lobe. IMPRESSION: Left arm PICC tip in the right atrium. Withdraw 4 cm be above the right atrium if desired. Layering pleural effusion on the left with atelectatic changes of the left lung. Lesser patchy atelectasis at the right lung base. Electronically Signed   By: Nelson Chimes M.D.   On: 01/16/2021 08:38   DG CHEST PORT 1 VIEW  Result Date: 01/13/2021 CLINICAL DATA:  Respiratory distress EXAM: PORTABLE CHEST 1 VIEW COMPARISON:  01/10/2021 FINDINGS: Low lung volumes. Moderate left and small right pleural effusions. No frank interstitial edema. No pneumothorax. The heart is normal in size. Left subclavian venous catheter terminates in the mid SVC. Right shoulder arthroplasty. IMPRESSION: Moderate left and small right pleural effusions. No frank interstitial edema. Electronically Signed   By: Julian Hy M.D.   On: 01/13/2021 02:34   DG CHEST PORT 1 VIEW  Result Date: 01/10/2021 CLINICAL DATA:  Hypoxemia EXAM: PORTABLE CHEST 1 VIEW COMPARISON:  October sixteenth, 2022. FINDINGS: Evaluation is limited secondary to rotation. The cardiomediastinal silhouette is unchanged in contour.Interval extubation. The enteric tube courses through the chest to the abdomen beyond the field-of-view. Revisualization of the LEFT subclavian approach central catheter with tip terminating over the region of the confluence of the LEFT brachiocephalic vein and SVC. Low lung volumes. Decreased conspicuity of bilateral small pleural effusions. No pneumothorax. LEFT retrocardiac homogeneous  opacity most consistent with atelectasis. Visualized abdomen is unremarkable. Status post RIGHT shoulder arthroplasty. IMPRESSION: Interval extubation. LEFT basilar homogeneous opacity, likely atelectasis. Electronically Signed   By: Valentino Saxon M.D.   On: 01/10/2021 08:20   DG CHEST PORT 1 VIEW  Result Date: 01/08/2021 CLINICAL DATA:  Endotracheal tube EXAM: PORTABLE CHEST 1 VIEW COMPARISON:  01/07/2021 FINDINGS: No significant change in AP portable chest radiograph, support apparatus including endotracheal tube, left subclavian vascular catheter, esophagogastric tube. Small, layering bilateral pleural effusions. Heart and mediastinum unremarkable. IMPRESSION: 1. No significant change in AP portable chest radiograph, support apparatus including endotracheal tube, left subclavian vascular catheter, esophagogastric tube. 2. Small, layering bilateral pleural effusions. Electronically Signed   By: Delanna Ahmadi M.D.   On: 01/08/2021 13:09   DG CHEST PORT 1 VIEW  Result Date: 01/07/2021 CLINICAL DATA:  Shortness of breath EXAM: PORTABLE CHEST 1 VIEW COMPARISON:  [employer]  FINDINGS: Endotracheal tube terminates 4.4 cm above carina. Nasogastric tube terminates at the proximal stomach. Left-sided subclavian line tip at mid SVC. Right shoulder arthroplasty. Normal heart size. Probable tiny left pleural effusion, similar. No pneumothorax. Suspect mild pulmonary venous congestion. Extremely low lung volumes with right hemidiaphragm elevation and bibasilar atelectasis. IMPRESSION: No significant change since one day prior. Similar appearance of right hemidiaphragm elevation and bibasilar atelectasis. Trace left pleural fluid. Electronically Signed   By: Abigail Miyamoto M.D.   On: 01/07/2021 11:07   Portable Chest x-ray  Result Date: 01/06/2021 CLINICAL DATA:  Postoperative ileus, respiratory failure EXAM: PORTABLE CHEST 1 VIEW COMPARISON:  01/01/2021 FINDINGS: Endotracheal tube is seen 3.2 cm above the  carina. Nasogastric tube extends into the upper abdomen beyond the margin of the examination. Left subclavian central venous catheter tip noted within the superior vena cava. Lung volumes are small. Lungs are clear. No pneumothorax or pleural effusion. Cardiac size within normal limits. Pulmonary vascularity is normal. IMPRESSION: Support tubes in appropriate position. Pulmonary hypoinflation. Electronically Signed   By: Fidela Salisbury M.D.   On: 01/06/2021 03:54   DG CHEST PORT 1 VIEW  Result Date: 01/04/2021 CLINICAL DATA:  Cough, ileus, and abdominal pain EXAM: PORTABLE CHEST 1 VIEW COMPARISON:  01/01/2021 FINDINGS: Enteric tube is present with tip in the right upper quadrant consistent with location in the distal stomach or proximal duodenum. Shallow inspiration with linear atelectasis in the lung bases. No airspace disease or consolidation. Normal heart size. No pneumothorax. Postoperative changes in the right shoulder. IMPRESSION: Shallow inspiration with atelectasis in the lung bases. Electronically Signed   By: Lucienne Capers M.D.   On: 01/04/2021 00:23   Portable Chest x-ray  Result Date: 01/02/2021 CLINICAL DATA:  Respiratory failure EXAM: PORTABLE CHEST 1 VIEW COMPARISON:  05/26/2020 FINDINGS: Endotracheal tube seen 5.8 cm above the carina. Nasogastric tube extends into the upper abdomen just beyond the expected gastroesophageal junction. Lung volumes are small and there is mild elevation of the right hemidiaphragm. Small left pleural effusion is present. No superimposed focal pulmonary infiltrate. No pneumothorax. Cardiac size within normal limits. Pulmonary vascularity is normal. No acute bone abnormality. IMPRESSION: Endotracheal tube in appropriate position. Nasogastric tube may be advanced 5-10 cm to ensure appropriate position within the gastric lumen. Pulmonary hypoinflation. Small left pleural effusion. Electronically Signed   By: Fidela Salisbury M.D.   On: 01/02/2021 00:15   DG Abd  Portable 1V  Result Date: 01/10/2021 CLINICAL DATA:  NG tube placement EXAM: PORTABLE ABDOMEN - 1 VIEW COMPARISON:  KUB 01/06/2021 FINDINGS: The enteric catheter tip and side hole project over the stomach. There is a nonobstructive bowel gas pattern. Enteric contrast is seen in the right colon, partially imaged. The lungs are better assessed on the same day chest radiograph. IMPRESSION: NG tube tip and side hole in the stomach. Electronically Signed   By: Valetta Mole M.D.   On: 01/10/2021 16:39   DG Abd Portable 1V  Result Date: 01/06/2021 CLINICAL DATA:  Postoperative ileus. EXAM: PORTABLE ABDOMEN - 1 VIEW COMPARISON:  None. FINDINGS: Nasogastric tube tip noted within the expected mid body of the stomach. Multiple gas-filled mildly dilated loops of small and large bowel are again identified most in keeping with a adynamic ileus. Surgical drain overlies the mid pelvis. No gross free intraperitoneal gas. Right hip bipolar hemiarthroplasty has been performed. IMPRESSION: Nasogastric tube within the mid body of the stomach. Moderate adynamic ileus. Electronically Signed   By: Linwood Dibbles.D.  On: 01/06/2021 03:56   DG Abd Portable 1V  Result Date: 01/06/2021 CLINICAL DATA:  Nasogastric tube placement EXAM: PORTABLE ABDOMEN - 1 VIEW COMPARISON:  01/05/2021 FINDINGS: Enteric tube is present with tip coiled in the upper mid abdomen consistent with location in the body of the stomach. Persistent finding of gas-filled mildly dilated small bowel likely indicating small bowel obstruction. Atelectasis or infiltration in the lung bases. IMPRESSION: Enteric tube tip is in the upper mid abdomen consistent with location in the body of the stomach. Gaseous distention of small bowel, likely obstruction. Electronically Signed   By: Lucienne Capers M.D.   On: 01/06/2021 00:32   DG Abd Portable 1V  Result Date: 01/05/2021 CLINICAL DATA:  Enteric catheter adjustment EXAM: PORTABLE ABDOMEN - 1 VIEW COMPARISON:   01/04/2021 FINDINGS: Frontal view of the lower chest and upper abdomen demonstrates enteric catheter passing below diaphragm catheter coiled over the gastric body. Persistent gaseous distention of the small bowel left mid abdomen. Chronic elevation of the right hemidiaphragm. IMPRESSION: 1. Enteric catheter coiled over the gastric body. 2. Persistent gaseous distention of the small bowel. Electronically Signed   By: Randa Ngo M.D.   On: 01/05/2021 22:57   DG Abd Portable 1V  Result Date: 01/01/2021 CLINICAL DATA:  Abdominal pain. EXAM: PORTABLE ABDOMEN - 1 VIEW COMPARISON:  September 06, 2015 FINDINGS: The bowel gas pattern is normal. No radio-opaque calculi or other significant radiographic abnormality are seen. Osteoarthritic changes of the lumbosacral spine. Partially visualized right hip prosthesis. IMPRESSION: Nonobstructive bowel gas pattern. Electronically Signed   By: Fidela Salisbury M.D.   On: 01/01/2021 15:02   ECHOCARDIOGRAM COMPLETE  Result Date: 01/07/2021    ECHOCARDIOGRAM REPORT   Patient Name:   Travis [name] Brummet Date of Exam: 01/07/2021 Medical Rec #:  [id number]     Height:       72.0 in Accession #:    [id number]    Weight:       187.6 lb Date of Birth:  February 21, 1956    BSA:          2.073 m Patient Age:    80 years      BP:           104/55 mmHg Patient Gender: M             HR:           87 bpm. Exam Location:  Inpatient Procedure: 2D Echo, Cardiac Doppler and Color Doppler Indications:    Shock  History:        Patient has prior history of Echocardiogram examinations, most                 recent 11/09/2019. ETOH Abuse.  Sonographer:    Merrie Roof RDCS Referring Phys: [id number] [place]  1. No change in EF compared to TTE done 11/09/19. Left ventricular ejection fraction, by estimation, is 45 to 50%. The left ventricle has mildly decreased function. The left ventricle demonstrates global hypokinesis. Left ventricular diastolic parameters  were normal.  2. Right  ventricular systolic function is normal. The right ventricular size is normal.  3. The mitral valve is normal in structure. Mild mitral valve regurgitation. No evidence of mitral stenosis.  4. The aortic valve was not well visualized. Aortic valve regurgitation is not visualized. Mild aortic valve sclerosis is present, with no evidence of aortic valve stenosis.  5. The inferior vena cava is normal in size with greater than 50% respiratory variability,  suggesting right atrial pressure of 3 mmHg. FINDINGS  Left Ventricle: No change in EF compared to TTE done 11/09/19. Left ventricular ejection fraction, by estimation, is 45 to 50%. The left ventricle has mildly decreased function. The left ventricle demonstrates global hypokinesis. The left ventricular internal cavity size was normal in size. There is no left ventricular hypertrophy. Left ventricular diastolic parameters were normal. Right Ventricle: The right ventricular size is normal. No increase in right ventricular wall thickness. Right ventricular systolic function is normal. Left Atrium: Left atrial size was normal in size. Right Atrium: Right atrial size was normal in size. Pericardium: There is no evidence of pericardial effusion. Mitral Valve: The mitral valve is normal in structure. Mild mitral valve regurgitation. No evidence of mitral valve stenosis. Tricuspid Valve: The tricuspid valve is normal in structure. Tricuspid valve regurgitation is not demonstrated. No evidence of tricuspid stenosis. Aortic Valve: The aortic valve was not well visualized. Aortic valve regurgitation is not visualized. Mild aortic valve sclerosis is present, with no evidence of aortic valve stenosis. Aortic valve mean gradient measures 2.0 mmHg. Aortic valve peak gradient measures 4.6 mmHg. Aortic valve area, by VTI measures 2.61 cm. Pulmonic Valve: The pulmonic valve was normal in structure. Pulmonic valve regurgitation is not visualized. No evidence of pulmonic stenosis. Aorta:  The aortic root is normal in size and structure. Venous: The inferior vena cava is normal in size with greater than 50% respiratory variability, suggesting right atrial pressure of 3 mmHg. IAS/Shunts: No atrial level shunt detected by color flow Doppler.  LEFT VENTRICLE PLAX 2D LVIDd:         4.60 cm      Diastology LVIDs:         3.50 cm      LV e' medial:  5.22 cm/s LV PW:         1.00 cm      LV e' lateral: 9.14 cm/s LV IVS:        1.00 cm LVOT diam:     2.30 cm LV SV:         53 LV SV Index:   25 LVOT Area:     4.15 cm  LV Volumes (MOD) LV vol d, MOD A2C: 142.0 ml LV vol d, MOD A4C: 186.0 ml LV vol s, MOD A2C: 69.7 ml LV vol s, MOD A4C: 91.1 ml LV SV MOD A2C:     72.3 ml LV SV MOD A4C:     186.0 ml LV SV MOD BP:      88.1 ml RIGHT VENTRICLE RV Basal diam:  3.60 cm LEFT ATRIUM           Index        RIGHT ATRIUM           Index LA diam:      3.00 cm 1.45 cm/m   RA Area:     15.30 cm LA Vol (A4C): 46.5 ml 22.43 ml/m  RA Volume:   33.70 ml  16.25 ml/m  AORTIC VALVE AV Area (Vmax):    2.88 cm AV Area (Vmean):   2.63 cm AV Area (VTI):     2.61 cm AV Vmax:           107.00 cm/s AV Vmean:          72.400 cm/s AV VTI:            0.202 m AV Peak Grad:      4.6 mmHg AV Mean Grad:  2.0 mmHg LVOT Vmax:         74.10 cm/s LVOT Vmean:        45.800 cm/s LVOT VTI:          0.127 m LVOT/AV VTI ratio: 0.63  AORTA Ao Root diam: 3.20 cm  SHUNTS Systemic VTI:  0.13 m Systemic Diam: 2.30 cm Jenkins Rouge MD Electronically signed by Jenkins Rouge MD Signature Date/Time: 01/07/2021/12:08:21 PM    Final    [place] EKG SITE RITE  Result Date: 01/13/2021 If Site Rite image not attached, placement could not be confirmed due to current cardiac rhythm.  [place] EKG SITE RITE  Result Date: 01/03/2021 If Site Rite image not attached, placement could not be confirmed due to current cardiac rhythm.   Labs:  CBC: Recent Labs    01/14/21 0341 01/15/21 0425 01/16/21 0522 01/17/21 0424  WBC 15.7* 16.0* 19.5* 18.6*  HGB 8.0*  7.6* 7.7* 7.4*  HCT 24.8* 23.5* 24.2* 23.7*  PLT 542* 552* 565* 584*    COAGS: Recent Labs    01/01/21 1340 01/06/21 0308 01/06/21 0835  INR 1.1 1.3* 1.2  APTT  --   --  26    BMP: Recent Labs    01/13/21 0502 01/14/21 0341 01/16/21 0522 01/17/21 0424  NA 135 135 136 137  K 4.3 4.4 4.0 4.8  CL 104 100 97* 99  CO2 26 27 29 26   GLUCOSE 238* 296* 201* 205*  BUN 24* 30* 43* 52*  CALCIUM 8.9 9.0 8.9 9.2  CREATININE 0.93 1.09 1.43* 1.80*  GFRNONAA >60 >60 54* 41*    LIVER FUNCTION TESTS: Recent Labs    01/10/21 0219 01/12/21 0416 01/16/21 0522 01/17/21 0424  BILITOT 1.4* 0.8 1.1 1.1  AST 21 20 27  44*  ALT 17 16 25  36  ALKPHOS 116 140* 171* 230*  PROT 5.4* 5.5* 5.9* 6.4*  ALBUMIN 1.5* 1.5* 1.7* 1.8*    TUMOR MARKERS: No results for input(s): AFPTM, CEA, CA199, CHROMGRNA in the last 8760 hours.  Assessment and Plan:  LUQ abscess/collection  For drain placement in IR today Risks and benefits discussed with the patient including bleeding, infection, damage to adjacent structures, bowel perforation/fistula connection, and sepsis. All of the patient's questions were answered, patient is agreeable to proceed. Consent signed and in chart.   Left pleural effusion-- for thoracentesis in IR today Pt is aware of risks and benefits including but not limited to Infection; bleeding; pneumothorax--- need for chest tube placement He is agreeable to proceed Consent signed and in chart   Thank you for this interesting consult.  I greatly enjoyed meeting ROLLYN SCIALDONE and look forward to participating in their care.  A copy of this report was sent to the requesting provider on this date.  Electronically Signed: Lavonia Drafts, PA-C 01/17/2021, 9:44 AM   I spent a total of 40 Minutes    in face to face in clinical consultation, greater than 50% of which was counseling/coordinating care for LUQ abscess drain and Left thoracentesis

## 2021-01-17 NOTE — Progress Notes (Signed)
Cross-coverage note:   Patient seen for red MEWS and case reviewed with RN at bedside. Sinus tachycardia and mild tachypnea appear stable but he has a new fever despite scheduled Tylenol, appears to be first fever since 10/18. Has been off of Zosyn since 10/20.   Patient reports increased fatigue but denies new respiratory sxs or new abdominal pain.   Plan to check blood cultures and lactate. Procalcitonin, CBC, and CMP already ordered for this am.

## 2021-01-17 NOTE — Progress Notes (Signed)
Palliative Medicine Inpatient Follow Up Note  Consulting Provider: Delilah Shan, RN   Reason for consult:   Travis [name] Palliative Medicine Consult  Reason for Consult? Pt is needing increasing pressor/vent support, 2 major abdominal surgeries. RN and MD have spoken with family multiple times about comfort care but family is still having some trouble processing everything. Thank you!    HPI:  Per intake H&P --> [name]. Travis [name] is a 65 year old gentleman with an extended PMH that includes but not limited to CHF, CKD 3A, COPD, CRA O, TIA, diabetes, hypertension, gout, hyperlipidemia, CAD (nonobstructive). He presented to the ED on 01/01/21 with complaints of right lower quadrant abdominal pain --> taken to the operating room where he was found to have a perforated appendix and abdominal abscess. 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 Now with rapidly worsening shock. Palliative care was asked to get involved to further address goals of care in the setting of overall critical illness and ongoing declining state.   Today's Discussion (01/17/2021):  *Please note that this is a verbal dictation therefore any spelling or grammatical errors are due to the "[facility]" system interpretation.  Chart reviewed.   I met with Travis [name] this morning. He shares that he feels "alright". He emphasizes that he remains to have abdominal pain that is constant. We reviewed that he has been through a lot in terms of his perforated appendix and splenic laceration. We discussed the major surgeries that he has endured and how poorly he was doing for a period of time.  Travis [name] shares that he would like to continue the present level of care. He does not wish for extraneous measures if he declines further. We reviewed that he is being treated for sepsis at this time with antibiotics and the medical team may opt to pursue additional studies. He is in  agreement with this.   I spoke to patients niece, Travis [name] over the phone. We reviewed the patients present clinical state. Plan for ongoing support.   Questions and concerns answered  Objective Assessment: Vital Signs Vitals:   01/17/21 0424 01/17/21 0756  BP: 109/69 111/65  Pulse: 96 94  Resp: 18 17  Temp: 98.7 F (37.1 C) 98.2 F (36.8 C)  SpO2: 96% 97%    Intake/Output Summary (Last 24 hours) at 01/17/2021 1210 Last data filed at 01/17/2021 0131 Gross per 24 hour  Intake 380 ml  Output 500 ml  Net -120 ml    Last Weight  Most recent update: 01/16/2021  5:39 AM    Weight  85.7 kg (188 lb 15 oz)            Gen: Very ill appearing AA M HEENT: Dry mucous membranes CV: Irregular rate and rhythm  PULM: On 4LPM [place] ABD: (+) generalized abdomina tenderness, (+) abdominal dressings, (+) wound vac, (+) drain EXT: No edema  Neuro: Alert and oriented, able to converse  SUMMARY OF RECOMMENDATIONS   DNAR   Continue current level of care  Primary team informed via secure chat of increase in pain (abdominal)    Patient is a member of [employer], will request chaplain support   Palliative care will  remain involved to aid in symptoms support  Symptoms: Abdominal Pain: - Oxycontin 58m PO BID - Continue oxycodone 5-141mPO Q4H PRN - Continue Dilaudid  Situation Depression: - Remeron 7.17m29mO QHS  Lack of Appetite: - Remeron as above - Diet consult  Generalized Weakness: - PT/OT  Time Spent: 35 Greater than 50% of the time was spent in counseling and coordination of care _________________________________________________________________________________ [place] Team Team Cell Phone: [phone] Please utilize secure chat with additional questions, if there is no response within 30 minutes please call the above phone number  Palliative Medicine Team providers are available by phone from 7am to 7pm daily and can be  reached through the team cell phone.  Should this patient require assistance outside of these hours, please call the patient's attending physician.

## 2021-01-17 NOTE — Progress Notes (Signed)
PHARMACY - TOTAL PARENTERAL NUTRITION CONSULT NOTE   Indication: Prolonged ileus  Patient Measurements: Height: 6' (182.9 cm) Weight: 85.7 kg (188 lb 15 oz) IBW/kg (Calculated) : 77.6   Body mass index is 25.62 kg/m.  Assessment: 65 YO male who presented 10/09 with acute perforated appendix and abdominal abscess s/p OR for ex-lap, ilececectomy, and drainage of abscess on 10/09. Also had splenic laceration, s/p ex-lap, splenectomy, and wound vac application 09/47. Transferred to ICU on 10/12 with septic vs. hemorrhagic shock, intubated and on vasopressor support. Plan was initially to transition to comfort measures 10/14; however improved overnight with decreasing vasopressor requirement. Post-op ileus. Pharmacy consulted to start TPN.  Per chart review, patient has become more confused and combative with distended abdomen since 10/24. New fevers overnight Tmax 101.8 (on APAP Q6H). Empiric antibiotics to be started today while sepsis work-up in progress.  Glucose / Insulin: hx DM2 (no meds documented PTA). CBGs/12h 142-205 in past 24 hours with 23 units of SSI + 85 units of regular insulin in TPN bag.  Electrolytes: Na 137, K 4.8. (goal >/=4 with ileus), Cl/HCO2 wnl, CoCa 10.3 (Ca removed on 10/17), Phos 4.7 (10/24), Mg 2.1 (10/24) Renal: AKI on CKD worse with SCr up to 1.80, BUN up 52 Hepatic: AST incr to 44, ALT 36, Alk phos incr to 230. TG 139 (10/24). Tbili 1.1, alb 1.8,  Intake / Output; MIVF: UOP decreased from > 2L/day >> 966m/day 10/24, NGT removed 10/20 JP drain output 52.540m  LBM 10/21  GI Imaging:  10/09>> CT abdomen: Acute appendicitis with perforation 10/14>> CT abdomen: Splenic rupture with large splenic hematoma 10/20>> CT abdomen: Indeterminate 2.4 cm fluid collection in the right lower quadrant adjacent to the cecal resection site. 10/24 >> CT abdomen: Splenectomy with organizing complex fluid collections in the left upper quadrant. The largest collection there is 162 mL.  Stable small discontiguous rim enhancing fluid collections in the lower small bowel mesentery at the pelvic inlet a short distance from the percutaneous drain. These have not significantly changed and collectively are less than 10 mL  GI Surgeries / Procedures: none since TPN start Central access: CVC triple lumen 10/14 >> 10/21; PICC 10/21 >> TPN start date: 01/08/21  Nutritional Goals: Goal TPN rate is 100 mL/hr (provides 130 g of protein and 2421 kcals per day)  RD Assessment: Estimated Needs Total Energy Estimated Needs: 2400-2600 Total Protein Estimated Needs: 125-150 grams Total Fluid Estimated Needs: > 2 L  Current Nutrition:  - Ensure Enlive po TID (350 kcal and 20 grams protein) 1 charted as given 10/24 - Magic cup TID (290 kcal and 9 grams of protein ) 0 charted as given 10/24 - TPN  Plan:  Continue TPN at 80 ml/hr to provide 1937 kcal and 104g protein meeting ~80% of estimated needs (discussed reduction with surgery) and help with appetite stimulation Electrolytes in TPN: Na 40 meq/L, Mg 5 mEq/L. Decrease K to 35 mEq/L (AKI), Ca 0 mEq/L, Phos 15 mmol/L, Cl:Ac 1:2 Continue MVI and trace elements to TPN + folic acid and thiamine with hx ETOH abuse Continue resistant SSI q4h Continue insulin R in TPN bag at 85 units (ratio drop with change in CHO + increase due to SSI requirements) Monitor TPN labs on Mon/Thurs F/u return of bowel function, ability to initiate tube feeds and wean off TPN   Thank you for allowing pharmacy to be a part of this patient's care.  AdArdyth HarpsPharmD Clinical Pharmacist  **Pharmacist phone directory can now  be found on [email or web address] ([email or web address]).  Listed under [facility].    

## 2021-01-18 DIAGNOSIS — Z7189 Other specified counseling: Secondary | ICD-10-CM | POA: Diagnosis not present

## 2021-01-18 DIAGNOSIS — Z515 Encounter for palliative care: Secondary | ICD-10-CM | POA: Diagnosis not present

## 2021-01-18 LAB — CBC
HCT: 22.4 % — ABNORMAL LOW (ref 39.0–52.0)
Hemoglobin: 7 g/dL — ABNORMAL LOW (ref 13.0–17.0)
MCH: 30.2 pg (ref 26.0–34.0)
MCHC: 31.3 g/dL (ref 30.0–36.0)
MCV: 96.6 fL (ref 80.0–100.0)
Platelets: 562 10*3/uL — ABNORMAL HIGH (ref 150–400)
RBC: 2.32 MIL/uL — ABNORMAL LOW (ref 4.22–5.81)
RDW: 16.3 % — ABNORMAL HIGH (ref 11.5–15.5)
WBC: 14.9 10*3/uL — ABNORMAL HIGH (ref 4.0–10.5)
nRBC: 0.3 % — ABNORMAL HIGH (ref 0.0–0.2)

## 2021-01-18 LAB — BLOOD CULTURE ID PANEL (REFLEXED) - BCID2

## 2021-01-18 LAB — GRAM STAIN: Gram Stain: NONE SEEN

## 2021-01-18 LAB — GLUCOSE, CAPILLARY
Glucose-Capillary: 145 mg/dL — ABNORMAL HIGH (ref 70–99)
Glucose-Capillary: 150 mg/dL — ABNORMAL HIGH (ref 70–99)
Glucose-Capillary: 168 mg/dL — ABNORMAL HIGH (ref 70–99)
Glucose-Capillary: 154 mg/dL — ABNORMAL HIGH (ref 70–99)
Glucose-Capillary: 150 mg/dL — ABNORMAL HIGH (ref 70–99)

## 2021-01-18 LAB — COMPREHENSIVE METABOLIC PANEL
ALT: 37 U/L (ref 0–44)
AST: 40 U/L (ref 15–41)
Albumin: <1.5 g/dL — ABNORMAL LOW (ref 3.5–5.0)
Alkaline Phosphatase: 230 U/L — ABNORMAL HIGH (ref 38–126)
Anion gap: 8 (ref 5–15)
BUN: 42 mg/dL — ABNORMAL HIGH (ref 8–23)
CO2: 27 mmol/L (ref 22–32)
Calcium: 9 mg/dL (ref 8.9–10.3)
Chloride: 104 mmol/L (ref 98–111)
Creatinine, Ser: 1.58 mg/dL — ABNORMAL HIGH (ref 0.61–1.24)
GFR, Estimated: 48 mL/min — ABNORMAL LOW (ref >60)
Glucose, Bld: 166 mg/dL — ABNORMAL HIGH (ref 70–99)
Potassium: 4.6 mmol/L (ref 3.5–5.1)
Sodium: 139 mmol/L (ref 135–145)
Total Bilirubin: 1.2 mg/dL (ref 0.3–1.2)
Total Protein: 6 g/dL — ABNORMAL LOW (ref 6.5–8.1)

## 2021-01-18 LAB — PREPARE RBC (CROSSMATCH)

## 2021-01-18 LAB — PROCALCITONIN: Procalcitonin: 0.57 ng/mL

## 2021-01-18 LAB — HEMOGLOBIN AND HEMATOCRIT, BLOOD
HCT: 25.4 % — ABNORMAL LOW (ref 39.0–52.0)
Hemoglobin: 8.1 g/dL — ABNORMAL LOW (ref 13.0–17.0)

## 2021-01-18 LAB — BRAIN NATRIURETIC PEPTIDE: B Natriuretic Peptide: 212.5 pg/mL — ABNORMAL HIGH (ref 0.0–100.0)

## 2021-01-18 MED ORDER — LACTATED RINGERS IV SOLN: INTRAVENOUS | Status: AC

## 2021-01-18 MED ORDER — POLYETHYLENE GLYCOL 3350 17 G PO PACK
17.0000 g | PACK | Freq: Every day | ORAL | Status: DC
  Administered 2021-01-18 – 2021-02-07 (×14): 17 g via ORAL
  Filled 2021-01-18 (×21): qty 1

## 2021-01-18 MED ORDER — DOCUSATE SODIUM 100 MG PO CAPS
100.0000 mg | ORAL_CAPSULE | Freq: Two times a day (BID) | ORAL | Status: DC
  Administered 2021-01-18 – 2021-02-09 (×39): 100 mg via ORAL
  Filled 2021-01-18 (×45): qty 1

## 2021-01-18 MED ORDER — SODIUM CHLORIDE 0.9% IV SOLUTION: Freq: Once | INTRAVENOUS | Status: AC

## 2021-01-18 MED ORDER — VANCOMYCIN HCL 1250 MG/250ML IV SOLN
1250.0000 mg | INTRAVENOUS | Status: DC
  Administered 2021-01-18 – 2021-01-19 (×2): 1250 mg via INTRAVENOUS
  Filled 2021-01-18 (×2): qty 250

## 2021-01-18 MED ORDER — BISACODYL 10 MG RE SUPP: 10.0000 mg | Freq: Every day | RECTAL | Status: DC | PRN

## 2021-01-18 MED ORDER — LACTATED RINGERS IV SOLN: INTRAVENOUS | Status: DC

## 2021-01-18 MED ORDER — TRAVASOL 10 % IV SOLN
INTRAVENOUS | Status: AC
  Filled 2021-01-18: qty 1036.8

## 2021-01-18 NOTE — Progress Notes (Signed)
Referring Physician(s): Dr Bobbye Morton  Supervising Physician: Arne Cleveland  Patient Status:  [facility] - In-pt  Chief Complaint:  LUQ abscess - post op Left pleural effusion  Subjective:  Procedure:  1.) Left thoracentesis yielding 600 mL serosanguinous fluid 2.) LUQ 60F drain placed yielding thick dark bloody fluid, looks like hematoma.  Pt is up in bed some; breathing better after thoracentesis OP drain is dark blood 20 cc in JP Flushes easily  Allergies: Patient has no known allergies.  Medications: Prior to Admission medications   Medication Sig Start Date End Date Taking? Authorizing Provider  albuterol (VENTOLIN HFA) 108 (90 Base) MCG/ACT inhaler Inhale 2 puffs into the lungs every 6 (six) hours as needed for wheezing or shortness of breath.   Yes [provider]  allopurinol (ZYLOPRIM) 100 MG tablet Take 1 tablet (100 mg total) by mouth 2 (two) times daily. 12/19/20  Yes [name], NP  aspirin 325 MG tablet Take 162.5 mg by mouth in the morning.   Yes [provider]  atorvastatin (LIPITOR) 20 MG tablet Take 20 mg by mouth daily.   Yes [provider]  carvedilol (COREG) 6.25 MG tablet Take 1 tablet (6.25 mg total) by mouth 2 (two) times daily. 06/03/20  Yes Theora Gianotti, NP  cetirizine (ZYRTEC) 10 MG tablet Take 1 tablet (10 mg total) by mouth daily. 07/07/20  Yes [name], NP  fenofibrate (TRICOR) 145 MG tablet TAKE 1 TABLET BY MOUTH ONCE A DAY 11/23/20  Yes Dutch Quint B, FNP  ferrous sulfate 325 (65 FE) MG tablet Take 1 tablet (325 mg total) by mouth daily with breakfast. 06/02/20  Yes Baity, Coralie Keens, NP  fluticasone (FLONASE) 50 MCG/ACT nasal spray Place 1-2 sprays into both nostrils daily as needed for allergies or rhinitis.   Yes [provider]  furosemide (LASIX) 40 MG tablet Take 1 tablet by mouth daily.   Yes [provider]  hydroxypropyl methylcellulose / hypromellose (ISOPTO TEARS /  GONIOVISC) 2.5 % ophthalmic solution Place 1 drop into both eyes 3 (three) times daily as needed for dry eyes.   Yes [provider]  losartan (COZAAR) 25 MG tablet Take 0.5 tablets (12.5 mg total) by mouth daily. 12/19/20 03/19/21 Yes [name], NP  magnesium oxide (MAG-OX) 400 MG tablet Take 1 tablet (400 mg total) by mouth daily. 12/19/20  Yes [name], NP  multivitamin (ONE-A-DAY MEN'S) TABS tablet Take 1 tablet by mouth daily with breakfast.   Yes [provider]  Omega-3 Fatty Acids (FISH OIL PO) Take 1 capsule by mouth daily.   Yes [provider]  pantoprazole (PROTONIX) 40 MG tablet Take 1 tablet (40 mg total) by mouth daily. 06/02/20  Yes Baity, Coralie Keens, NP  triamcinolone cream (KENALOG) 0.1 % APPLY TO AFFECTED AREAS TWICE A DAY AS NEEDED. AVOID FACE, GROIN, OR UNDERARMS. Patient taking differently: Apply 1 application topically 2 (two) times daily as needed (dry skin, rash). 11/01/20  Yes Dutch Quint B, FNP     Vital Signs: BP 127/68 (BP Location: Right Arm)   Pulse (!) 107   Temp 98.9 F (37.2 C) (Oral)   Resp (!) 22   Ht 6' (1.829 m)   Wt 208 lb 8.9 oz (94.6 kg)   SpO2 94%   BMI 28.29 kg/m   Physical Exam Vitals reviewed.  Skin:    General: Skin is warm.     Comments: Site is c/d/I NT no bleeding OP  Is bloody 20 cc in JP; 125 cc yesterday Cx pending     Imaging: DG Chest 1 View  Result Date: 01/17/2021 CLINICAL DATA:  Status post left thoracentesis. EXAM: CHEST  1 VIEW COMPARISON:  Chest x-ray obtained earlier today FINDINGS: No evidence of pneumothorax. Reduced left pleural effusion. New percutaneous drainage catheter projects over the left upper quadrant. Left upper extremity PICC. Catheter tip terminates over the right atrium. Very low lung volumes. Pulmonary vascular congestion. IMPRESSION: No evidence of pneumothorax or other complication following left-sided thoracentesis. Electronically Signed   By: Jacqulynn Cadet M.D.    On: 01/17/2021 16:33   DG Chest 1 View  Result Date: 01/17/2021 CLINICAL DATA:  Shortness of breath. EXAM: CHEST  1 VIEW COMPARISON:  January 16, 2021. FINDINGS: Stable cardiomediastinal silhouette. Left-sided PICC line is unchanged in position. Large left lung opacity is noted concerning for pneumonia or atelectasis with associated pleural effusion. Right basilar subsegmental atelectasis is noted. Status post right shoulder arthroplasty. IMPRESSION: Large left lung opacity is again noted concerning for pneumonia or atelectasis with associated pleural effusion. Electronically Signed   By: Marijo Conception M.D.   On: 01/17/2021 09:30   DG Abd 1 View  Result Date: 01/17/2021 CLINICAL DATA:  Abdominal pain. EXAM: ABDOMEN - 1 VIEW COMPARISON:  January 15, 2021. FINDINGS: The bowel gas pattern is normal. No radio-opaque calculi or other significant radiographic abnormality are seen. IMPRESSION: Negative. Electronically Signed   By: Marijo Conception M.D.   On: 01/17/2021 09:31   DG Abd 1 View  Result Date: 01/15/2021 CLINICAL DATA:  Abdominal pain. EXAM: ABDOMEN - 1 VIEW COMPARISON:  01/10/2021 FINDINGS: Nasogastric 2 has been withdrawn or removed. Bowel gas pattern is nonobstructive. Residual contrast is identified in nondilated loops of colon. Moderate degenerative changes in the lumbar spine. Remote RIGHT hip arthroplasty. IMPRESSION: Nonobstructive bowel gas pattern. Residual contrast in nondilated colon. Electronically Signed   By: Nolon Nations M.D.   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 Patient now has systemic evidence of infection with a complex fluid collection versus hematoma in the left subdiaphragmatic space with an associated left-sided pleural effusion. He presents for thoracentesis and  percutaneous abscess drain placement. EXAM: ULTRASOUND GUIDED ABSCESS DRAINAGE; US THORACENTESIS ASP PLEURAL SPACE W/IMG GUIDE MEDICATIONS: The patient is currently admitted to the hospital and receiving intravenous antibiotics. The antibiotics were administered within an appropriate time frame prior to the initiation of the procedure. ANESTHESIA/SEDATION: Fentanyl 75 mcg IV; Versed 1.5 mg IV Moderate Sedation Time: 27 minutes The patient was continuously monitored during the procedure by the interventional radiology nurse under my direct supervision. COMPLICATIONS: None immediate. PROCEDURE: Informed written consent was obtained from the patient after a thorough discussion of the procedural risks, benefits and alternatives. All questions were addressed. Maximal Sterile Barrier Technique was utilized including caps, mask, sterile gowns, sterile gloves, sterile drape, hand hygiene and skin antiseptic. A timeout was performed prior to the initiation of the procedure. Attention was first turned to the right-sided pleural effusion. The right thorax was interrogated with ultrasound. A minimally complex pleural effusion is identified. Local anesthesia was attained by infiltration with 1% lidocaine. A small dermatotomy was made. Under real-time ultrasound guidance, a 5 [place] Yueh centesis catheter was advanced in the pleural space. The catheter was then attached to a evacuated bottle and thoracentesis was performed yielding  approximately 600 mL serosanguineous fluid. A sample was reserved for labs. The Yueh centesis catheter was removed and a Band-Aid applied. Next, the left upper quadrant subdiaphragmatic collection was assessed sonographically. A highly complex fluid collection is present in the left upper quadrant. A suitable skin entry site was again selected and marked. Local anesthesia was attained by infiltration with 1% lidocaine. A small dermatotomy was made. Under real-time ultrasound guidance, a 17 gauge  introducer needle was advanced into the fluid collection. Through this, a 0.035 wire was advanced into the fluid collection. The introducer needle was removed. The tract was dilated to 34 [place]. A Cook 12 [place] all-purpose drainage catheter was advanced over the wire and into the fluid collection. Aspiration yields thick turbid bloody fluid. Approximately 40 mL was successfully aspirated. The catheter was then connected to JP bulb suction and secured to the skin with 0 Prolene suture and an adhesive fixation device. The patient tolerated the procedure well. IMPRESSION: 1. Successful left thoracentesis yielding 600 mL serosanguineous pleural fluid. 2. Successful placement of a 12 French drainage catheter into the complex fluid collection in the left subdiaphragmatic space with aspiration of thick, turbid bloody fluid consistent with hematoma. A sample was sent for Gram stain and culture. Electronically Signed   By: Jacqulynn Cadet M.D.   On: 01/17/2021 16:32   CT ABDOMEN PELVIS W CONTRAST  Result Date: 01/16/2021 CLINICAL DATA:  65 year old male status post partial colon resection and splenectomy splenectomy site seroma/hematoma. Abdominal distension. EXAM: CT ABDOMEN AND PELVIS WITH CONTRAST TECHNIQUE: Multidetector CT imaging of the abdomen and pelvis was performed using the standard protocol following bolus administration of intravenous contrast. CONTRAST:  54mL OMNIPAQUE IOHEXOL 350 MG/ML SOLN COMPARISON:  CT Chest, Abdomen and Pelvis 01/12/2021 FINDINGS: Lower chest: Moderate left pleural effusion with simple fluid density but substantial compressive atelectasis of the left lower lobe has not regressed. No pericardial effusion. Right lower lobe consolidation persists, indeterminate for severe atelectasis (the parenchyma is enhancing) versus pneumonia. Middle lobes remain aerated. Hepatobiliary: Stable, negative. Pancreas: The pancreas remains within normal limits, although there is inflammation at the  tail of the pancreas from the splenic region. Spleen: Splenectomy with complex mixed density and the larger component partially rim enhancing fluid collection in the left upper quadrant at the splenectomy bed. The larger component of organized collection there is 62 x 83 by 63 mm for an estimated volume of 162 mL. A smaller rim enhancing component is anterior and lateral to the dominant collection as seen on series 3, image 23. Associated mass effect on the gastric cardia and proximal greater curve, and some of the fluid is inseparable from the gastric serosa (series 3, image 19). No gas within the collections. Adrenals/Urinary Tract: Urinary bladder partially obscured by streak artifact from the hip arthroplasty. Stable adrenal glands and kidneys. Symmetric renal enhancement and early contrast excretion on the delayed images. Stomach/Bowel: Oral contrast throughout the residual large bowel, which appears negative from the hepatic flexure distally. Right lower quadrant percutaneous drain with regional mesenteric inflammation, mild architectural distortion at the cecum. The small discontiguous rim enhancing fluid collections in the lower small bowel mesentery at the pelvic inlet persist and are rim enhancing today on series 3, image 68, but have not increased in size. Collectively these are less than 10 mL. Only mild inflammation of the distal small bowel now. No dilated small bowel. Duodenal diverticulum redemonstrated. Stable ventral midline abdominal postoperative changes. Mass effect and architectural distortion on the stomach is described with the  spleen. No free air identified. Vascular/Lymphatic: Aortoiliac calcified atherosclerosis. Major arterial structures in the abdomen and pelvis remain patent. Portal venous system is patent. No lymphadenopathy identified. Reproductive: Negative. Other: No pelvic free fluid. Musculoskeletal: Advanced lumbar spine degeneration. Right hip arthroplasty. No acute osseous  abnormality identified. IMPRESSION: 1. Splenectomy with organizing complex fluid collections in the left upper quadrant. The largest collection there is 162 mL. Smaller adjacent collections, some inseparable from the gastric serosa and with associated mass effect and architectural distortion on the stomach. Again favor hematoma/seroma. Abscess is not excluded, but there is no gas within the collections. 2. Stable small discontiguous rim enhancing fluid collections in the lower small bowel mesentery at the pelvic inlet a short distance from the percutaneous drain. These have not significantly changed and collectively are less than 10 mL. 3. Moderate left pleural effusion with substantial left lower lobe atelectasis persists. Right lower lobe consolidation persists, indeterminate for atelectasis versus pneumonia. 4. No bowel obstruction or new abnormality identified in the abdomen or pelvis. 5. Aortic Atherosclerosis (ICD10-I70.0). Electronically Signed   By: Genevie Ann M.D.   On: 01/16/2021 04:34   DG Chest Port 1 View  Result Date: 01/16/2021 CLINICAL DATA:  Shortness of breath.  Asthma. EXAM: PORTABLE CHEST 1 VIEW COMPARISON:  01/14/2020 FINDINGS: Left arm PICC is been placed with the tip in the mid right atrium. Withdraw 4 cm to be above the right atrium if desired. Artifact overlies the chest otherwise. Hazy opacity overlying the left chest probably relates to a layering effusion with associated dependent atelectasis. Mild atelectasis present in the right lower lobe. IMPRESSION: Left arm PICC tip in the right atrium. Withdraw 4 cm be above the right atrium if desired. Layering pleural effusion on the left with atelectatic changes of the left lung. Lesser patchy atelectasis at the right lung base. Electronically Signed   By: Nelson Chimes M.D.   On: 01/16/2021 08:38   US THORACENTESIS ASP PLEURAL SPACE W/IMG GUIDE  Result Date: 01/17/2021 INDICATION: 65 year old male status post appendectomy and partial  cecectomy complicated by spontaneous splenic rupture necessitating splenectomy several days later. Patient now has systemic evidence of infection with a complex fluid collection versus hematoma in the left subdiaphragmatic space with an associated left-sided pleural effusion. He presents for thoracentesis and percutaneous abscess drain placement. EXAM: ULTRASOUND GUIDED ABSCESS DRAINAGE; US THORACENTESIS ASP PLEURAL SPACE W/IMG GUIDE MEDICATIONS: The patient is currently admitted to the hospital and receiving intravenous antibiotics. The antibiotics were administered within an appropriate time frame prior to the initiation of the procedure. ANESTHESIA/SEDATION: Fentanyl 75 mcg IV; Versed 1.5 mg IV Moderate Sedation Time: 27 minutes The patient was continuously monitored during the procedure by the interventional radiology nurse under my direct supervision. COMPLICATIONS: None immediate. PROCEDURE: Informed written consent was obtained from the patient after a thorough discussion of the procedural risks, benefits and alternatives. All questions were addressed. Maximal Sterile Barrier Technique was utilized including caps, mask, sterile gowns, sterile gloves, sterile drape, hand hygiene and skin antiseptic. A timeout was performed prior to the initiation of the procedure. Attention was first turned to the right-sided pleural effusion. The right thorax was interrogated with ultrasound. A minimally complex pleural effusion is identified. Local anesthesia was attained by infiltration with 1% lidocaine. A small dermatotomy was made. Under real-time ultrasound guidance, a 5 [place] Yueh centesis catheter was advanced in the pleural space. The catheter was then attached to a evacuated bottle and thoracentesis was performed yielding approximately 600 mL serosanguineous fluid. A sample  was reserved for labs. The Yueh centesis catheter was removed and a Band-Aid applied. Next, the left upper quadrant subdiaphragmatic collection  was assessed sonographically. A highly complex fluid collection is present in the left upper quadrant. A suitable skin entry site was again selected and marked. Local anesthesia was attained by infiltration with 1% lidocaine. A small dermatotomy was made. Under real-time ultrasound guidance, a 17 gauge introducer needle was advanced into the fluid collection. Through this, a 0.035 wire was advanced into the fluid collection. The introducer needle was removed. The tract was dilated to 20 [place]. A Cook 12 [place] all-purpose drainage catheter was advanced over the wire and into the fluid collection. Aspiration yields thick turbid bloody fluid. Approximately 40 mL was successfully aspirated. The catheter was then connected to JP bulb suction and secured to the skin with 0 Prolene suture and an adhesive fixation device. The patient tolerated the procedure well. IMPRESSION: 1. Successful left thoracentesis yielding 600 mL serosanguineous pleural fluid. 2. Successful placement of a 12 French drainage catheter into the complex fluid collection in the left subdiaphragmatic space with aspiration of thick, turbid bloody fluid consistent with hematoma. A sample was sent for Gram stain and culture. Electronically Signed   By: Jacqulynn Cadet M.D.   On: 01/17/2021 16:32    Labs:  CBC: Recent Labs    01/15/21 0425 01/16/21 0522 01/17/21 0424 01/18/21 0359  WBC 16.0* 19.5* 18.6* 14.9*  HGB 7.6* 7.7* 7.4* 7.0*  HCT 23.5* 24.2* 23.7* 22.4*  PLT 552* 565* 584* 562*    COAGS: Recent Labs    01/01/21 1340 01/06/21 0308 01/06/21 0835  INR 1.1 1.3* 1.2  APTT  --   --  26    BMP: Recent Labs    01/14/21 0341 01/16/21 0522 01/17/21 0424 01/18/21 0359  NA 135 136 137 139  K 4.4 4.0 4.8 4.6  CL 100 97* 99 104  CO2 27 29 26 27   GLUCOSE 296* 201* 205* 166*  BUN 30* 43* 52* 42*  CALCIUM 9.0 8.9 9.2 9.0  CREATININE 1.09 1.43* 1.80* 1.58*  GFRNONAA >60 54* 41* 48*    LIVER FUNCTION TESTS: Recent  Labs    01/12/21 0416 01/16/21 0522 01/17/21 0424 01/18/21 0359  BILITOT 0.8 1.1 1.1 1.2  AST 20 27 44* 40  ALT 16 25 36 37  ALKPHOS 140* 171* 230* 230*  PROT 5.5* 5.9* 6.4* 6.0*  ALBUMIN 1.5* 1.7* 1.8* <1.5*    Assessment and Plan:  LUQ abscess/collection OP bloody-- possible hematoma Will follow Plan per [facility]  Electronically Signed: Lavonia Drafts, PA-C 01/18/2021, 11:05 AM   I spent a total of 15 Minutes at the the patient's bedside AND on the patient's hospital floor or unit, greater than 50% of which was counseling/coordinating care for LUQ abscess

## 2021-01-18 NOTE — Progress Notes (Signed)
Occupational Therapy Treatment Patient Details Name: Travis [name] MRN: [id number] DOB: 1956-01-08 Today's Date: 01/18/2021   History of present illness Pt is a 65 y/o male admitted 10/9 with complaints of R lower quadrant abdominal pain. Found with intra-abdominal sepsis with perforated appendix and abdominal abscess; s/p diagnostic laparoscopy, ileocecectomy, drainage of interaabdominal abscess 10/9. Post op ileus. 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 Pt self extubated 10/16. L thoracentesis 10/25 with 600cc drained. PMHx: CHF, CKD, COPD, TIA, diabetes, HTN, gout, CAD.   OT comments  Patient received in supine and confused.  Address grooming with brushing teeth and patient was unable to open toothpaste and required cues to use toothbrush.  Verbal cues to wash face and hands. Attempted to address bed mobility goal with rolling in bed with limited patient participation due to lethargic. Patient led and AROM exercises while at bed level. Acute OT to continue to follow.    Recommendations for follow up therapy are one component of a multi-disciplinary discharge planning process, led by the attending physician.  Recommendations may be updated based on patient status, additional functional criteria and insurance authorization.    Follow Up Recommendations  Skilled nursing-short term rehab (<3 hours/day)    Assistance Recommended at Discharge Frequent or constant Supervision/Assistance  Equipment Recommendations  Other (comment)    Recommendations for Other Services      Precautions / Restrictions Precautions Precautions: Fall Precaution Comments: wound vac and L drain in abdomen Restrictions Weight Bearing Restrictions: No       Mobility Bed Mobility Overal bed mobility: Needs Assistance Bed Mobility: Rolling Rolling: Mod assist         General bed mobility comments: addressed rolling side to side with limited patient participation    Transfers                    General transfer comment: patient seen at bedlevel     Balance                                           ADL either performed or assessed with clinical judgement   ADL Overall ADL's : Needs assistance/impaired     Grooming: Minimal assistance;Bed level;Oral care;Wash/dry hands;Wash/dry face Grooming Details (indicate cue type and reason): required assistance to open toothpaste and cues to use to toothbrush                               General ADL Comments: required cues to perform grooming tasks     Vision       Perception     Praxis      Cognition Arousal/Alertness: Awake/alert Behavior During Therapy: Flat affect Overall Cognitive Status: No family/caregiver present to determine baseline cognitive functioning Area of Impairment: Attention;Following commands;Safety/judgement;Memory                 Orientation Level: Disoriented to;Time;Situation Current Attention Level: Sustained Memory: Decreased recall of precautions Following Commands: Follows one step commands with increased time;Follows one step commands inconsistently Safety/Judgement: Decreased awareness of deficits;Decreased awareness of safety Awareness: Emergent Problem Solving: Slow processing;Decreased initiation;Requires verbal cues;Requires tactile cues General Comments: low motivation          Exercises Exercises: General Upper Extremity General Exercises - Upper Extremity Shoulder Flexion: AROM;Both;10 reps;Supine Shoulder ABduction: AROM;Both;10 reps;Supine Elbow Flexion: AROM;Both;10 reps;Supine Elbow  Extension: PROM;Both;10 reps;Supine Other Exercises Other Exercises: supine BLE AAROM: heel slides (x2-5 reps ea), ankle pumps x5 Other Exercises: verbal review for supine HEP including glute sets and quad sets but pt having difficulty understanding will need reinforcement Other Exercises: IS x5 reps, achieves 100-200 mL with max cues for  technique   Shoulder Instructions       General Comments RR into 30 rpm with limited supine activity but SpO2 WFL (>92% on RA) and BP WFL taken during session    Pertinent Vitals/ Pain       Pain Assessment: 0-10 Faces Pain Scale: Hurts whole lot Breathing: occasional labored breathing, short period of hyperventilation Negative Vocalization: occasional moan/groan, low speech, negative/disapproving quality Facial Expression: sad, frightened, frown Body Language: relaxed Consolability: distracted or reassured by voice/touch PAINAD Score: 4 Facial Expression: Tense Pain Location: stomach Pain Descriptors / Indicators: Discomfort;Guarding;Operative site guarding;Moaning Pain Intervention(s): Limited activity within patient's tolerance;Monitored during session  Home Living                                          Prior Functioning/Environment              Frequency  Min 2X/week        Progress Toward Goals  OT Goals(current goals can now be found in the care plan section)  Progress towards OT goals: Not progressing toward goals - comment  Acute Rehab OT Goals OT Goal Formulation: Patient unable to participate in goal setting Time For Goal Achievement: 01/25/21 Potential to Achieve Goals: Good ADL Goals Pt Will Perform Grooming: with set-up;sitting Pt Will Perform Lower Body Dressing: with adaptive equipment;sitting/lateral leans;with mod assist Pt Will Transfer to Toilet: with mod assist;with +2 assist;bedside commode;stand pivot transfer Pt Will Perform Toileting - Clothing Manipulation and hygiene: with mod assist;sitting/lateral leans Additional ADL Goal #1: Pt will complete bed mobiltiy with min assist as precursor to ADLS.  Plan Discharge plan remains appropriate    Co-evaluation                 AM-PAC OT "6 Clicks" Daily Activity     Outcome Measure   Help from another person eating meals?: Total Help from another person taking  care of personal grooming?: A Little Help from another person toileting, which includes using toliet, bedpan, or urinal?: Total Help from another person bathing (including washing, rinsing, drying)?: A Lot Help from another person to put on and taking off regular upper body clothing?: A Lot Help from another person to put on and taking off regular lower body clothing?: Total 6 Click Score: 10    End of Session    OT Visit Diagnosis: Other abnormalities of gait and mobility (R26.89);Muscle weakness (generalized) (M62.81);Pain;Other symptoms and signs involving cognitive function Pain - part of body:  (stomach)   Activity Tolerance Patient limited by lethargy   Patient Left in bed;with call bell/phone within reach;with bed alarm set;with family/visitor present   Nurse Communication Other (comment) (patient conitive status)        Time: 8563-1497 OT Time Calculation (min): 17 min  Charges: OT General Charges $OT Visit: 1 Visit OT Treatments $Self Care/Home Management : 8-22 mins  Lodema Hong, [place]  Pager [phone] Office [place] 01/18/2021, 12:15 PM

## 2021-01-18 NOTE — Progress Notes (Signed)
Palliative Medicine Inpatient Follow Up Note  Consulting Provider: Delilah Shan, RN   Reason for consult:   [place] Palliative Medicine Consult  Reason for Consult? Pt is needing increasing pressor/vent support, 2 major abdominal surgeries. RN and MD have spoken with family multiple times about comfort care but family is still having some trouble processing everything. Thank you!    HPI:  Per intake H&P --> [name]. [name] is a 65 year old gentleman with an extended PMH that includes but not limited to CHF, CKD 3A, COPD, CRA O, TIA, diabetes, hypertension, gout, hyperlipidemia, CAD (nonobstructive). He presented to the ED on 01/01/21 with complaints of right lower quadrant abdominal pain --> taken to the operating room where he was found to have a perforated appendix and abdominal abscess. 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 Now with rapidly worsening shock. Palliative care was asked to get involved to further address goals of care in the setting of overall critical illness and ongoing declining state.   Today's Discussion (01/18/2021):  *Please note that this is a verbal dictation therefore any spelling or grammatical errors are due to the "[place] [employer]" system interpretation.  Chart reviewed.   I met with Travis [name] at bedside. He was getting a dressing change by the [facility] RN. He was noted to have tremendous pain during this procedure. The surgical PA was present who shares the plan for additional antibiotics given patients abscess and hopeful improvements thereafter.  I shared with Travis [name] my overall concerns that his condition although at one point stable has worsened in the setting of sepsis. We discussed if he neglects to improve in the next day or two than we will have to make some decisions whether or not we continue with present measures or change our focus to comfort care. He is very much in favor of this. I  verbalized that I would call his niece, Travis [name] for additional conversation.  I called patients niece this afternoon. We reviewed Travis [name]'s condition and how he has declined in the past two days Travis [name] expressed that she has spoke to the rest of the family and they are all of the mindset that if Travis [name] does not improve to let him be comfortable and "go on" when his time comes. I provided therapeutic support for Travis [name] as her role as Travis [name]'s advocate has been a daunting one and has carried with it personal distress over the past two and a half weeks.   I spoke to patients RN and secure chatted MD Candiss Norse to make them aware of the above conversations  Questions concerns answered & Palliative support provided  Objective Assessment: Vital Signs Vitals:   01/18/21 0932 01/18/21 1200  BP: 127/68 (!) 145/74  Pulse: (!) 107 (!) 109  Resp: (!) 22 (!) 22  Temp: 98.9 F (37.2 C) 99.2 F (37.3 C)  SpO2: 94% 94%    Intake/Output Summary (Last 24 hours) at 01/18/2021 1355 Last data filed at 01/18/2021 1204 Gross per 24 hour  Intake 1596.26 ml  Output 2925 ml  Net -1328.74 ml    Last Weight  Most recent update: 01/18/2021  5:26 AM    Weight  94.6 kg (208 lb 8.9 oz)            Gen: Very ill appearing AA M HEENT: Dry mucous membranes CV: Irregular rate and rhythm  PULM: On RS ABD: (+) generalized abdomina tenderness, (+) abdominal dressings, (+) wound vac, (+) drain  EXT: No edema  Neuro: Alert and oriented, able to converse  SUMMARY OF RECOMMENDATIONS   DNAR/DNI  MOST on chart   Plan for another day of treatment, if patient continues to decline to pursue comfort focused care. Patient and family are all in agreement   Ongoing support  Time Spent: 35 Greater than 50% of the time was spent in counseling and coordination of care _________________________________________________________________________________ [place] Team Team Cell Phone:  [phone] Please utilize secure chat with additional questions, if there is no response within 30 minutes please call the above phone number  Palliative Medicine Team providers are available by phone from 7am to 7pm daily and can be reached through the team cell phone.  Should this patient require assistance outside of these hours, please call the patient's attending physician.

## 2021-01-18 NOTE — Progress Notes (Signed)
PROGRESS NOTE        PATIENT DETAILS Name: Travis [name] Age: 65 y.o. Sex: male Date of Birth: 08/21/1955 Admit Date: 01/01/2021 Admitting Physician Georganna Skeans, MD ERD:EYCXK, Alyson Locket, NP  Brief Narrative: Patient is a 65 y.o. male with history of HFrEF, CKD stage III, COPD, HTN, DM-2, HLD, nonobstructive CAD-who presented with abdominal pain-found to have sepsis from ruptured appendicitis-underwent exploratory laparotomy/ileocecectomy and drainage of intra-abdominal abscess.Patient was briefly managed in the ICU-unfortunately-on 10/14 he developed severe abdominal pain -he was then found to have hemorrhagic shock due to a spontaneous splenic rupture-he was reevaluated by general surgery and underwent a splenectomy.  He was readmitted to the ICU-remained on the ventilator-he self extubated on 10/16-he was monitored closely-upon further stability-transfer to the [facility] hospitalist service on 10/20.  Significant events: 10/09>>admit- ex lap, remained intubated post op-to ICU 10/10>>extubated 10/12>> transferred to [facility] 10/14>>hypotensive/abd pain-CT showed spleenc rupture-to OR for splenectomy. 10/16>> Self-extubated  10/18>> Increased O2 requirement to 8L 10/19>> O2 weaned to 4L 10/20>> transferred to [facility]    Radiology/Echo 10/09>> CT abdomen: Acute appendicitis with perforation 10/14>> CT abdomen: Splenic rupture with large splenic hematoma 10/15>> TTE: EF 45-50% 10/20>> CT abdomen/pelvis: Indeterminate 2.4 cm fluid collection in the right lower quadrant adjacent to the cecal resection site.  Moderate left pleural effusion with compressive atelectasis. 10/24 >> CT - 1. Splenectomy with organizing complex fluid collections in the left upper quadrant. The largest collection there is 162 mL. Smaller adjacent collections, some inseparable from the gastric serosa and with associated mass effect and architectural distortion on the stomach. Again favor  hematoma/seroma. Abscess is not excluded, but there is no gas within the collections. 2. Stable small discontiguous rim enhancing fluid collections in the lower small bowel mesentery at the pelvic inlet a short distance from the percutaneous drain. These have not significantly changed and collectively are less than 10 mL    Subjective: Patient in bed denies any headache, mild abdominal pain, no shortness of breath he says he feels good but looks quite drained and frail today.    Objective: Vitals: Blood pressure 127/68, pulse (!) 107, temperature 98.9 F (37.2 C), temperature source Oral, resp. rate (!) 22, height 6' (1.829 m), weight 94.6 kg, SpO2 94 %.   Exam:  Awake Alert, No new F.N deficits, Normal affect [place].AT,PERRAL Supple Neck, No JVD,   Symmetrical Chest wall movement, Good air movement bilaterally, CTAB RRR,No Gallops, Rubs or new Murmurs,  +ve B.Sounds, Abd Soft, W Vac and drain in place No Cyanosis, Clubbing or edema,          Assessment/Plan:   Sepsis due to acute appendicitis with perforation/intra-abdominal abscesses and Bacteroides bacteremia (s/p laparotomy/ileocecectomy and drainage of intra-abdominal abscess on 10/9): Sepsis physiology has resolved-Zosyn was discontinued on 10/20.  On clear liquid diet + TNA.  CT abdomen on 10/24  noted -fortunately he started spiking fevers again on 01/16/2021, case was discussed with general surgery IR placed another drain in the left upper quadrant to drain residual fluid on 01/17/2021, also underwent left-sided thoracentesis on 01/17/2021 draining 600 cc of exudative fluid likely sympathetic effusion with negative gram stain.  Resumed Unasyn on 01/17/2021 covering both for intra-abdominal infection and possible pneumonia on x-ray.  Follow cultures.  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     Acute hypoxic respiratory failure due to atelectasis/pleural effusion: Self extubated on 10/16-stable on anywhere from 2-4 L of oxygen-did develop some transient shortness of breath on 10/20-which responded to bronchodilators/diuretics.    Patient appears to have a moderate-sized left-sided pleural effusion which is likely transudative from-HFrEF/reactive to splenectomy.  PRN Lasix, also IR to do ultrasound-guided thoracentesis on 01/17/2021 I do not expect this to be parapneumonic or infectious .   Acute metabolic encephalopathy: Rapidly improving-continue supportive care.  AKI on CKD stage IIIa: AKI hemodynamically mediated-hold Lasix, IVF repeat on 01/17/2021 and monitor.  HTN: BP stable-continue Coreg-losartan remains on hold  Nonischemic cardiomyopathy/HFrEF: Not in exacerbation-continue Lasix and monitor volume status closely.  Follow electrolytes.   Nonobstructive CAD: No anginal symptoms-already on beta-blocker-we will resume aspirin/statin over the next few days when more stable.  DM-2 (A1c 6.4 on 06/02/2020): CBGs stable-continue SSI  Recent Labs    01/17/21 2347 01/18/21 0329 01/18/21 0755  GLUCAP 152* 145* 150*     Anemia of chronic disease and acute disease.  1 unit of packed RBC transfusion on 01/18/2021, no signs of active bleeding   HLD: Resume statin/fenofibrate when oral intake was stable.  COPD: Stable-continue bronchodilators  Functional quadriplegia/debility/deconditioning: Due to acute illness-continue PT/OT eval-either SNF or LTAC on discharge.  Other issues: Has subclavian TLC since 10/14-this was removed on 10/21-he now has a PICC line through which TNA is infusing.  Nutrition Status: Nutrition Problem: Moderate Malnutrition Etiology: acute illness, chronic illness (perforated appendix, CAD, cardiomyopathy) Signs/Symptoms: mild muscle depletion, mild fat depletion Interventions: TPN  Obesity: Estimated body mass index is 28.29 kg/m as calculated  from the following:   Height as of this encounter: 6' (1.829 m).   Weight as of this encounter: 94.6 kg.    Procedures: 10/9>>exploratory laparotomy, ileocecectomy, drainage of intraabdominal abscess  10/14>> exploratory laparotomy and splenectomy 10/14>> left subclavian triple-lumen catheter 01/17/2021  >> left ultrasound-guided thoracentesis 600 cc exudative fluid, gram stain -ve 01/17/2021  >> LUQ JP drain by IR   Consults: General surgery, PCCM DVT Prophylaxis: Heparin Code Status:DNR Family Communication: Sister-in-law at bedside on 01/15/21.  Niece [name] [phone] over the phone on 01/17/2021  Time spent: 35 minutes-Greater than 50% of this time was spent in counseling, explanation of diagnosis, planning of further management, and coordination of care.  Diet: Diet Order             Diet heart healthy/carb modified Room service appropriate? Yes; Fluid consistency: Thin  Diet effective now                    Disposition Plan: Status is: Inpatient  Remains inpatient appropriate because: Requires inpatient level of care.   Barriers to Discharge: Perforated appendix with pelvic abscess/splenic rupture-s/p laparotomy/splenectomy-not yet stable for discharge.     MEDICATIONS: Scheduled Meds:  sodium chloride   Intravenous Once   acetaminophen  1,000 mg Oral Q6H   Chlorhexidine Gluconate Cloth  6 each Topical Daily   docusate sodium  100 mg Oral BID   feeding supplement  237 mL Oral TID BM   heparin injection (subcutaneous)  5,000 Units Subcutaneous Q8H   insulin aspart  0-20 Units Subcutaneous Q4H   mouth rinse  15 mL Mouth Rinse BID   methocarbamol  500 mg Oral QID   metoprolol tartrate  50 mg Oral BID   mirtazapine  7.5 mg Oral QHS   oxyCODONE  20 mg Oral Q12H  pantoprazole sodium  40 mg Oral Daily   polyethylene glycol  17 g Oral Daily   sodium chloride flush  3 mL Intravenous Q12H   sodium chloride flush  5 mL Intracatheter Q8H   Continuous  Infusions:  sodium chloride     ampicillin-sulbactam (UNASYN) IV 3 g (01/18/21 0412)   lactated ringers 75 mL/hr at 01/18/21 1017   TPN ADULT (ION) 80 mL/hr at 01/17/21 1810   TPN ADULT (ION)     vancomycin 1,250 mg (01/18/21 0621)   PRN Meds:.Place/Maintain arterial line **AND** sodium chloride, bisacodyl, dextromethorphan, HYDROmorphone (DILAUDID) injection, hydroxypropyl methylcellulose / hypromellose, ipratropium-albuterol, ondansetron (ZOFRAN) IV, oxyCODONE, phenol, sodium chloride flush   I have personally reviewed following labs and imaging studies  LABORATORY DATA:  Recent Labs  Lab 01/14/21 0341 01/15/21 0425 01/16/21 0522 01/17/21 0424 01/18/21 0359  WBC 15.7* 16.0* 19.5* 18.6* 14.9*  HGB 8.0* 7.6* 7.7* 7.4* 7.0*  HCT 24.8* 23.5* 24.2* 23.7* 22.4*  PLT 542* 552* 565* 584* 562*  MCV 95.8 95.1 94.9 96.0 96.6  MCH 30.9 30.8 30.2 30.0 30.2  MCHC 32.3 32.3 31.8 31.2 31.3  RDW 16.0* 15.9* 15.8* 16.1* 16.3*    Recent Labs  Lab 01/12/21 0416 01/13/21 0502 01/14/21 0341 01/16/21 0522 01/16/21 0536 01/17/21 0424 01/18/21 0359  NA 141 135 135 136  --  137 139  K 4.2 4.3 4.4 4.0  --  4.8 4.6  CL 105 104 100 97*  --  99 104  CO2 26 26 27 29   --  26 27  GLUCOSE 191* 238* 296* 201*  --  205* 166*  BUN 23 24* 30* 43*  --  52* 42*  CREATININE 1.09 0.93 1.09 1.43*  --  1.80* 1.58*  CALCIUM 8.9 8.9 9.0 8.9  --  9.2 9.0  AST 20  --   --  27  --  44* 40  ALT 16  --   --  25  --  36 37  ALKPHOS 140*  --   --  171*  --  230* 230*  BILITOT 0.8  --   --  1.1  --  1.1 1.2  ALBUMIN 1.5*  --   --  1.7*  --  1.8* <1.5*  MG 1.7 2.1  --  2.1  --   --   --   PROCALCITON  --   --   --  0.65  --  0.80 0.57  LATICACIDVEN  --   --   --   --  0.9 1.5  --   BNP  --   --   --  61.3  --  74.1 212.5*          RADIOLOGY STUDIES/RESULTS: DG Chest 1 View  Result Date: 01/17/2021 CLINICAL DATA:  Status post left thoracentesis. EXAM: CHEST  1 VIEW COMPARISON:  Chest x-ray obtained  earlier today FINDINGS: No evidence of pneumothorax. Reduced left pleural effusion. New percutaneous drainage catheter projects over the left upper quadrant. Left upper extremity PICC. Catheter tip terminates over the right atrium. Very low lung volumes. Pulmonary vascular congestion. IMPRESSION: No evidence of pneumothorax or other complication following left-sided thoracentesis. Electronically Signed   By: Jacqulynn Cadet M.D.   On: 01/17/2021 16:33   DG Chest 1 View  Result Date: 01/17/2021 CLINICAL DATA:  Shortness of breath. EXAM: CHEST  1 VIEW COMPARISON:  January 16, 2021. FINDINGS: Stable cardiomediastinal silhouette. Left-sided PICC line is unchanged in position. Large left lung opacity is noted concerning for  pneumonia or atelectasis with associated pleural effusion. Right basilar subsegmental atelectasis is noted. Status post right shoulder arthroplasty. IMPRESSION: Large left lung opacity is again noted concerning for pneumonia or atelectasis with associated pleural effusion. Electronically Signed   By: Marijo Conception M.D.   On: 01/17/2021 09:30   DG Abd 1 View  Result Date: 01/17/2021 CLINICAL DATA:  Abdominal pain. EXAM: ABDOMEN - 1 VIEW COMPARISON:  January 15, 2021. FINDINGS: The bowel gas pattern is normal. No radio-opaque calculi or other significant radiographic abnormality are seen. IMPRESSION: Negative. Electronically Signed   By: Marijo Conception M.D.   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 Patient now has systemic evidence of infection with a complex fluid collection versus hematoma in the left subdiaphragmatic space with an associated left-sided pleural effusion. He presents for thoracentesis and percutaneous abscess drain placement. EXAM: ULTRASOUND GUIDED ABSCESS DRAINAGE; US  THORACENTESIS ASP PLEURAL SPACE W/IMG GUIDE MEDICATIONS: The patient is currently admitted to the hospital and receiving intravenous antibiotics. The antibiotics were administered within an appropriate time frame prior to the initiation of the procedure. ANESTHESIA/SEDATION: Fentanyl 75 mcg IV; Versed 1.5 mg IV Moderate Sedation Time: 27 minutes The patient was continuously monitored during the procedure by the interventional radiology nurse under my direct supervision. COMPLICATIONS: None immediate. PROCEDURE: Informed written consent was obtained from the patient after a thorough discussion of the procedural risks, benefits and alternatives. All questions were addressed. Maximal Sterile Barrier Technique was utilized including caps, mask, sterile gowns, sterile gloves, sterile drape, hand hygiene and skin antiseptic. A timeout was performed prior to the initiation of the procedure. Attention was first turned to the right-sided pleural effusion. The right thorax was interrogated with ultrasound. A minimally complex pleural effusion is identified. Local anesthesia was attained by infiltration with 1% lidocaine. A small dermatotomy was made. Under real-time ultrasound guidance, a 5 [place] Yueh centesis catheter was advanced in the pleural space. The catheter was then attached to a evacuated bottle and thoracentesis was performed yielding approximately 600 mL serosanguineous fluid. A sample was reserved for labs. The Yueh centesis catheter was removed and a Band-Aid applied. Next, the left upper quadrant subdiaphragmatic collection was assessed sonographically. A highly complex fluid collection is present in the left upper quadrant. A suitable skin entry site was again selected and marked. Local anesthesia was attained by infiltration with 1% lidocaine. A small dermatotomy was made. Under real-time ultrasound guidance, a 17 gauge introducer needle was advanced into the fluid collection. Through this, a 0.035 wire was  advanced into the fluid collection. The introducer needle was removed. The tract was dilated to 43 [place]. A Cook 12 [place] all-purpose drainage catheter was advanced over the wire and into the fluid collection. Aspiration yields thick turbid bloody fluid. Approximately 40 mL was successfully aspirated. The catheter was then connected to JP bulb suction and secured to the skin with 0 Prolene suture and an adhesive fixation device. The patient tolerated the procedure well. IMPRESSION: 1. Successful left thoracentesis yielding 600 mL serosanguineous pleural fluid. 2. Successful placement of a 12 French drainage catheter into the complex fluid collection in the left subdiaphragmatic space with aspiration of thick, turbid bloody fluid consistent with hematoma. A sample was sent for Gram stain and culture. Electronically Signed   By: Jacqulynn Cadet M.D.   On: 01/17/2021 16:32   US THORACENTESIS  ASP PLEURAL SPACE W/IMG GUIDE  Result Date: 01/17/2021 INDICATION: 65 year old male status post appendectomy and partial cecectomy complicated by spontaneous splenic rupture necessitating splenectomy several days later. Patient now has systemic evidence of infection with a complex fluid collection versus hematoma in the left subdiaphragmatic space with an associated left-sided pleural effusion. He presents for thoracentesis and percutaneous abscess drain placement. EXAM: ULTRASOUND GUIDED ABSCESS DRAINAGE; US THORACENTESIS ASP PLEURAL SPACE W/IMG GUIDE MEDICATIONS: The patient is currently admitted to the hospital and receiving intravenous antibiotics. The antibiotics were administered within an appropriate time frame prior to the initiation of the procedure. ANESTHESIA/SEDATION: Fentanyl 75 mcg IV; Versed 1.5 mg IV Moderate Sedation Time: 27 minutes The patient was continuously monitored during the procedure by the interventional radiology nurse under my direct supervision. COMPLICATIONS: None immediate. PROCEDURE:  Informed written consent was obtained from the patient after a thorough discussion of the procedural risks, benefits and alternatives. All questions were addressed. Maximal Sterile Barrier Technique was utilized including caps, mask, sterile gowns, sterile gloves, sterile drape, hand hygiene and skin antiseptic. A timeout was performed prior to the initiation of the procedure. Attention was first turned to the right-sided pleural effusion. The right thorax was interrogated with ultrasound. A minimally complex pleural effusion is identified. Local anesthesia was attained by infiltration with 1% lidocaine. A small dermatotomy was made. Under real-time ultrasound guidance, a 5 [place] Yueh centesis catheter was advanced in the pleural space. The catheter was then attached to a evacuated bottle and thoracentesis was performed yielding approximately 600 mL serosanguineous fluid. A sample was reserved for labs. The Yueh centesis catheter was removed and a Band-Aid applied. Next, the left upper quadrant subdiaphragmatic collection was assessed sonographically. A highly complex fluid collection is present in the left upper quadrant. A suitable skin entry site was again selected and marked. Local anesthesia was attained by infiltration with 1% lidocaine. A small dermatotomy was made. Under real-time ultrasound guidance, a 17 gauge introducer needle was advanced into the fluid collection. Through this, a 0.035 wire was advanced into the fluid collection. The introducer needle was removed. The tract was dilated to 77 [place]. A Cook 12 [place] all-purpose drainage catheter was advanced over the wire and into the fluid collection. Aspiration yields thick turbid bloody fluid. Approximately 40 mL was successfully aspirated. The catheter was then connected to JP bulb suction and secured to the skin with 0 Prolene suture and an adhesive fixation device. The patient tolerated the procedure well. IMPRESSION: 1. Successful left  thoracentesis yielding 600 mL serosanguineous pleural fluid. 2. Successful placement of a 12 French drainage catheter into the complex fluid collection in the left subdiaphragmatic space with aspiration of thick, turbid bloody fluid consistent with hematoma. A sample was sent for Gram stain and culture. Electronically Signed   By: Jacqulynn Cadet M.D.   On: 01/17/2021 16:32     LOS: 17 days   Signature  Lala Lund M.D on 01/18/2021 at 11:59 AM   -  To page go to [email or web address]

## 2021-01-18 NOTE — Progress Notes (Signed)
Physical Therapy Treatment Patient Details Name: Travis [name] MRN: [id number] DOB: [birth year] Today's Date: 01/18/2021   History of Present Illness Pt is a 65 y/o male admitted 10/9 with complaints of R lower quadrant abdominal pain. Found with intra-abdominal sepsis with perforated appendix and abdominal abscess; s/p diagnostic laparoscopy, ileocecectomy, drainage of interaabdominal abscess 10/9. Post op ileus. 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 Pt self extubated 10/16. L thoracentesis 10/25 with 600cc drained. PMHx: CHF, CKD, COPD, TIA, diabetes, HTN, gout, CAD.    PT Comments    Pt received in supine, lethargic and confused but minimally cooperative initially. Emphasis on benefits of mobility/importance of repositioning for pressure relief and donned prevalon boots for pressure relief but pt refusing to attempt partial sidelying for pressure relief on sacrum, RN notified to attempt again later. RN gave pain meds during session but pt continues to defer EOB/OOB mobility, notably HR elevated to ~110 bpm at rest and RR elevated to 30 rpm with minimal supine LE exercises. Pt having difficulty with IS, able to achieve ~228mL at most. Pt continues to benefit from PT services to progress toward functional mobility goals.    Recommendations for follow up therapy are one component of a multi-disciplinary discharge planning process, led by the attending physician.  Recommendations may be updated based on patient status, additional functional criteria and insurance authorization.  Follow Up Recommendations  Skilled nursing-short term rehab (<3 hours/day)     Assistance Recommended at Discharge Frequent or constant Supervision/Assistance  Equipment Recommendations  Rolling walker (2 wheels)    Recommendations for Other Services       Precautions / Restrictions Precautions Precautions: Fall Precaution Comments: wound vac and L drain in abdomen Restrictions Weight Bearing  Restrictions: No     Mobility  Bed Mobility Overal bed mobility: Needs Assistance       General bed mobility comments: cues for using BLE to assist with posterior supine scooting toward HOB (arms crossed at chest for abdominal protection) but pt reports pain too high to attempt; totalA +2 to scoot toward HOB, pt c/o pain with flat bed and increased WOB noted.    Transfers     General transfer comment: pt refusing EOB or OOB mobility    Ambulation/Gait     General Gait Details: NT         Cognition Arousal/Alertness: Lethargic Behavior During Therapy: Flat affect Overall Cognitive Status: No family/caregiver present to determine baseline cognitive functioning Area of Impairment: Attention;Following commands;Safety/judgement;Memory   Current Attention Level: Sustained Memory: Decreased recall of precautions Following Commands: Follows one step commands with increased time;Follows one step commands inconsistently Safety/Judgement: Decreased awareness of deficits;Decreased awareness of safety Awareness: Emergent (asked to go to the bathroom, had a hard time understanding the condom cath needed multiple cues for reassurance) Problem Solving: Slow processing;Decreased initiation;Requires verbal cues;Requires tactile cues General Comments: pt flat, soft spoken and lethargic, c/o pain and discomfort not willing to log roll to L or R for repositioning/pressure relief and refusing EOB. Of note, multiple interruptions during session including RN and PA in room and had just spoken to Chaplain about his HCPOA so pt seemed overwhelmed.        Exercises Other Exercises Other Exercises: supine BLE AAROM: heel slides (x2-5 reps ea), ankle pumps x5 Other Exercises: verbal review for supine HEP including glute sets and quad sets but pt having difficulty understanding will need reinforcement Other Exercises: IS x5 reps, achieves 100-200 mL with max cues for technique    General  Comments  General comments (skin integrity, edema, etc.): RR into 30 rpm with limited supine activity but SpO2 WFL (>92% on RA) and BP WFL taken during session      Pertinent Vitals/Pain Pain Assessment: 0-10 Faces Pain Scale: Hurts whole lot Breathing: occasional labored breathing, short period of hyperventilation Negative Vocalization: occasional moan/groan, low speech, negative/disapproving quality Facial Expression: sad, frightened, frown Body Language: tense, distressed pacing, fidgeting Consolability: distracted or reassured by voice/touch PAINAD Score: 5 Facial Expression: Grimacing Pain Location: stomach Pain Descriptors / Indicators: Discomfort;Guarding;Operative site guarding;Moaning Pain Intervention(s): Limited activity within patient's tolerance;Monitored during session;Repositioned;RN gave pain meds during session     PT Goals (current goals can now be found in the care plan section) Acute Rehab PT Goals Patient Stated Goal: none stated PT Goal Formulation: With patient Time For Goal Achievement: 01/24/21 Progress towards PT goals: Not progressing toward goals - comment (pain and unstable vitals/low Hgb likely limiting progress this date)    Frequency    Min 3X/week      PT Plan Current plan remains appropriate       AM-PAC PT "6 Clicks" Mobility   Outcome Measure  Help needed turning from your back to your side while in a flat bed without using bedrails?: A Lot Help needed moving from lying on your back to sitting on the side of a flat bed without using bedrails?: Total Help needed moving to and from a bed to a chair (including a wheelchair)?: Total Help needed standing up from a chair using your arms (e.g., wheelchair or bedside chair)?: Total Help needed to walk in hospital room?: Total Help needed climbing 3-5 steps with a railing? : Total 6 Click Score: 7    End of Session   Activity Tolerance: Patient limited by pain;Patient limited by lethargy Patient  left: with call bell/phone within reach;in bed;with bed alarm set;with SCD's reapplied;with family/visitor present (pt visitor entering room) Nurse Communication: Mobility status;Need for lift equipment PT Visit Diagnosis: Difficulty in walking, not elsewhere classified (R26.2);Other abnormalities of gait and mobility (R26.89);Muscle weakness (generalized) (M62.81);Pain Pain - part of body:  (abdomen)     Time: 3220-2542 PT Time Calculation (min) (ACUTE ONLY): 23 min  Charges:  $Therapeutic Activity: 8-22 mins                     Chelan Heringer P., PTA Acute Rehabilitation Services Pager: [phone] Office: [place] 01/18/2021, 11:06 AM

## 2021-01-18 NOTE — Progress Notes (Signed)
Pharmacy Antibiotic Note  Travis [name] is a 65 y.o. male admitted on 01/01/2021 with  intra-abdominal infection .  Pharmacy has been consulted for Vancomycin dosing. Pt is currently spiking fevers on Unasyn monotherapy and had blood culture with methicillin resistant staph epi (possible contaminant), but will treat for now with ongoing fever. WBC elevated. Renal function is worsening.   Plan: Add vancomycin 1250 mg IV q24h >>Estimated AUC: 484 Cont Unasyn as ordered Watch renal function-adjust vancomycin dose as needed  Height: 6' (182.9 cm) Weight: 85.7 kg (188 lb 15 oz) IBW/kg (Calculated) : 77.6  Temp (24hrs), Avg:100.6 F (38.1 C), Min:98.2 F (36.8 C), Max:102.7 F (39.3 C)  Recent Labs  Lab 01/12/21 0416 01/12/21 0750 01/13/21 0502 01/14/21 0341 01/15/21 0425 01/16/21 0522 01/16/21 0536 01/17/21 0424 01/18/21 0359  WBC  --    < > 19.4* 15.7* 16.0* 19.5*  --  18.6* 14.9*  CREATININE 1.09  --  0.93 1.09  --  1.43*  --  1.80*  --   LATICACIDVEN  --   --   --   --   --   --  0.9 1.5  --    < > = values in this interval not displayed.    Estimated Creatinine Clearance: 44.9 mL/min (A) (by C-G formula based on SCr of 1.8 mg/dL (H)).    No Known Allergies  Narda Bonds, PharmD, BCPS Clinical Pharmacist Phone: [phone]

## 2021-01-18 NOTE — Progress Notes (Signed)
Pt medicated per MAR and fever decreased.  Pt also relaxed and resting comfortably.  Will continue to monitor.   01/17/21 2100  Assess: MEWS Score  Temp (!) 102.7 F (39.3 C)  BP 135/63  Pulse Rate (!) 122  ECG Heart Rate (!) 122  Resp (!) 28  SpO2 94 %  O2 Device Room Air  Assess: MEWS Score  MEWS Temp 2  MEWS Systolic 0  MEWS Pulse 2  MEWS RR 2  MEWS LOC 0  MEWS Score 6  MEWS Score Color Red  Assess: if the MEWS score is Yellow or Red  Were vital signs taken at a resting state? Yes  Focused Assessment No change from prior assessment  Early Detection of Sepsis Score *See Row Information* Medium  MEWS guidelines implemented *See Row Information* Yes  Take Vital Signs  Increase Vital Sign Frequency  Red: Q 1hr X 4 then Q 4hr X 4, if remains red, continue Q 4hrs  Escalate  MEWS: Escalate Red: discuss with charge nurse/RN and provider, consider discussing with RRT  Notify: Charge Nurse/RN  Name of Charge Nurse/RN Notified April, RN  Date Charge Nurse/RN Notified 01/17/21  Time Charge Nurse/RN Notified 2100  Notify: Provider  Provider Name/Title T. Opyd  Date Provider Notified 01/17/21  Time Provider Notified 2100  Notification Type Page  Notification Reason Other (Comment) (Red MEWS)  Provider response No new orders  Date of Provider Response 01/17/21  Time of Provider Response 2100  Document  Patient Outcome Stabilized after interventions

## 2021-01-18 NOTE — Consult Note (Addendum)
[place] Nurse wound follow up Patient receiving care in [facility] 5W30 Margie Billet, PA-C present for dressing change today.  Wound type: Midline abdominal surgical incision Measurements: 20 x 4 x 3.2 at the distal end of the wound Pressure Injury POA: NA  Wound bed: pink granulation tissue with some scattered yellow fibrinous tissue . Friable. Bleeding when dressing removed.  Drainage (amount, consistency, odor) Serosanguinous in canister Periwound: Intact Dressing procedure/placement/frequency: 2 pieces of black foam removed. 2 pieces of black foam used to fill the wound bed which extends down to the pubic hair line. Medium size Kellie Simmering # [phone]) Drape applied and immediate suction obtained at 125 mmHg. Canister Kellie Simmering # [phone]). Use NS to loosen foam and premedicate prior to dressing change. Very painful for the patient.  M/W/F vac change. WOC will follow.  2 dressing kits at bedside. Canister ordered to be placed at bedside.    Cathlean Marseilles [name], MSN, RN, [place], Lysle Pearl, [facility] Wound Treatment Associate Pager [phone]

## 2021-01-18 NOTE — Progress Notes (Signed)
PHARMACY - TOTAL PARENTERAL NUTRITION CONSULT NOTE   Indication: Prolonged ileus  Patient Measurements: Height: 6' (182.9 cm) Weight: 94.6 kg (208 lb 8.9 oz) IBW/kg (Calculated) : 77.6   Body mass index is 28.29 kg/m.  Assessment: 65 YO male who presented 10/09 with acute perforated appendix and abdominal abscess s/p OR for ex-lap, ilececectomy, and drainage of abscess on 10/09. Also had splenic laceration, s/p ex-lap, splenectomy, and wound vac application 38/88. Transferred to ICU on 10/12 with septic vs. hemorrhagic shock, intubated and on vasopressor support. Plan was initially to transition to comfort measures 10/14; however improved overnight with decreasing vasopressor requirement. Post-op ileus. Pharmacy consulted to start TPN.  Per chart review, patient has become more confused and combative with distended abdomen since 10/24. New fevers overnight Tmax 101.8 (on APAP Q6H). Empiric antibiotics to be started today while sepsis work-up in progress. BCID positive for MRSE. 10/25 fluid cultures pending.   Glucose / Insulin: hx DM2 (no meds documented PTA). CBGs/12h 145-201 in past 24 hours with 26 units of SSI + 85 units of regular insulin in TPN bag Electrolytes: Na 139, K 4.6. (goal >/=4 with ileus), Cl/HCO2 wnl, CoCa 11 (Ca removed on 10/17), Phos 4.7 (10/24), Mg 2.1 (10/24) Renal: AKI on CKD improving with SCr up to 1.80 (10/25) now 1.58, BUN elevated at 42 Hepatic: AST incr to 40, ALT 37, Alk phos incr to 230. TG 139 (10/24). Tbili 1.2, alb down to <1.5 Intake / Output; MIVF: UOP decreased from > 2L/day >> 965m/day 10/24, NGT removed 10/20 JP drain output 52.511m  LBM 10/21  GI Imaging:  10/09>> CT abdomen: Acute appendicitis with perforation 10/14>> CT abdomen: Splenic rupture with large splenic hematoma 10/20>> CT abdomen: Indeterminate 2.4 cm fluid collection in the right lower quadrant adjacent to the cecal resection site. 10/24 >> CT abdomen: Splenectomy with organizing  complex fluid collections in the left upper quadrant. The largest collection there is 162 mL. Stable small discontiguous rim enhancing fluid collections in the lower small bowel mesentery at the pelvic inlet a short distance from the percutaneous drain. These have not significantly changed and collectively are less than 10 mL  GI Surgeries / Procedures: none since TPN start Central access: CVC triple lumen 10/14 >> 10/21; PICC 10/21 >> TPN start date: 01/08/21  Nutritional Goals: Goal TPN rate is 100 mL/hr (provides 130 g of protein and 2421 kcals per day)  RD Assessment: Estimated Needs Total Energy Estimated Needs: 2400-2600 Total Protein Estimated Needs: 125-150 grams Total Fluid Estimated Needs: > 2 L  Current Nutrition:  - Ensure Enlive po TID (350 kcal and 20 grams protein) 1 charted as given 10/24 - Magic cup TID (290 kcal and 9 grams of protein ) 0 charted as given 10/24 - TPN  Plan:  Continue TPN at 80 ml/hr to provide 1937 kcal and 104g protein meeting ~80% of estimated needs (discussed reduction with surgery) and help with appetite stimulation Electrolytes in TPN: Na 40 meq/L, Mg 5 mEq/L. K 35 mEq/L, Ca 0 mEq/L, Phos 15 mmol/L, Cl:Ac 1:2 Continue MVI and trace elements to TPN + folic acid and thiamine with hx ETOH abuse Continue resistant SSI q4h Increase insulin R in TPN bag to 90 units (ratio drop with change in CHO + increase due to SSI requirements) Monitor TPN labs on Mon/Thurs F/u return of bowel function, ability to initiate tube feeds and wean off TPN   Thank you for allowing pharmacy to be a part of this patient's care.  AdVincente Liberty  Edwina Barth, PharmD Clinical Pharmacist  **Pharmacist phone directory can now be found on [email or web address] ([email or web address]).  Listed under [place].

## 2021-01-18 NOTE — Progress Notes (Signed)
Patient ID: Travis [name], male   DOB: 01/06/1956, 65 y.o.   MRN: [id number] [facility] Surgery Progress Note  12 Days Post-Op  Subjective: CC-  Seen with [place] during vac change. Patient continues to complain of abdominal pain, mostly on the right. Some nausea, no emesis. States that he is drinking liquids but not eating much. Says he has had a little flatus and a little BM, but this is not documented. Left thoracentesis yesterday with 600cc serosanguinous fluid. LUQ drain placed with dark bloody fluid - gram stain with gram negative rods. He is on unasyn. WBC is down to 14.9, but he remains febrile to 101.1 this morning.  Objective: Vital signs in last 24 hours: Temp:  [98.8 F (37.1 C)-102.7 F (39.3 C)] 98.8 F (37.1 C) (10/26 0757) Pulse Rate:  [86-122] 106 (10/26 0757) Resp:  [16-30] 27 (10/26 0757) BP: (96-142)/(62-90) 112/62 (10/26 0757) SpO2:  [94 %-100 %] 96 % (10/26 0757) Weight:  [94.6 kg] 94.6 kg (10/26 0500) Last BM Date: 01/13/21  Intake/Output from previous day: 10/25 0701 - 10/26 0700 In: -  Out: 2125 [Urine:1400; Drains:125] Intake/Output this shift: No intake/output data recorded.  PE: Gen:  Alert, NAD, pleasant Card:  tachy Pulm:  mild increased work of breathing Abd: Soft, mild distension, moderate right sided abdominal TTP with mild mild diffuse TTP, no peritonitis, midline incision clean    Lab Results:  Recent Labs    01/17/21 0424 01/18/21 0359  WBC 18.6* 14.9*  HGB 7.4* 7.0*  HCT 23.7* 22.4*  PLT 584* 562*   BMET Recent Labs    01/17/21 0424 01/18/21 0359  NA 137 139  K 4.8 4.6  CL 99 104  CO2 26 27  GLUCOSE 205* 166*  BUN 52* 42*  CREATININE 1.80* 1.58*  CALCIUM 9.2 9.0   PT/INR No results for input(s): LABPROT, INR in the last 72 hours. CMP     Component Value Date/Time   NA 139 01/18/2021 0359   NA 138 06/20/2020 1034   NA 141 04/20/2014 1019   K 4.6 01/18/2021 0359   K 3.7 04/20/2014 1019   CL 104  01/18/2021 0359   CL 106 04/20/2014 1019   CO2 27 01/18/2021 0359   CO2 30 04/20/2014 1019   GLUCOSE 166 (H) 01/18/2021 0359   GLUCOSE 102 (H) 04/20/2014 1019   BUN 42 (H) 01/18/2021 0359   BUN 18 06/20/2020 1034   BUN 14 04/20/2014 1019   CREATININE 1.58 (H) 01/18/2021 0359   CREATININE 1.18 04/20/2014 1019   CALCIUM 9.0 01/18/2021 0359   CALCIUM 9.3 04/20/2014 1019   PROT 6.0 (L) 01/18/2021 0359   PROT 6.5 06/18/2017 1425   PROT 7.4 04/20/2014 1019   ALBUMIN <1.5 (L) 01/18/2021 0359   ALBUMIN 4.0 06/18/2017 1425   ALBUMIN 3.4 04/20/2014 1019   AST 40 01/18/2021 0359   AST 21 04/20/2014 1019   ALT 37 01/18/2021 0359   ALT 20 04/20/2014 1019   ALKPHOS 230 (H) 01/18/2021 0359   ALKPHOS 97 04/20/2014 1019   BILITOT 1.2 01/18/2021 0359   BILITOT 0.4 06/18/2017 1425   BILITOT 0.6 04/20/2014 1019   GFRNONAA 48 (L) 01/18/2021 0359   GFRNONAA >60 04/20/2014 1019   GFRNONAA >60 11/19/2012 1107   GFRAA 53 (L) 01/13/2020 1410   GFRAA >60 04/20/2014 1019   GFRAA >60 11/19/2012 1107   Lipase     Component Value Date/Time   LIPASE 31 01/01/2021 1023   LIPASE 112 04/20/2014 1019  Studies/Results: DG Chest 1 View  Result Date: 01/17/2021 CLINICAL DATA:  Status post left thoracentesis. EXAM: CHEST  1 VIEW COMPARISON:  Chest x-ray obtained earlier today FINDINGS: No evidence of pneumothorax. Reduced left pleural effusion. New percutaneous drainage catheter projects over the left upper quadrant. Left upper extremity PICC. Catheter tip terminates over the right atrium. Very low lung volumes. Pulmonary vascular congestion. IMPRESSION: No evidence of pneumothorax or other complication following left-sided thoracentesis. Electronically Signed   By: Jacqulynn Cadet M.D.   On: 01/17/2021 16:33   DG Chest 1 View  Result Date: 01/17/2021 CLINICAL DATA:  Shortness of breath. EXAM: CHEST  1 VIEW COMPARISON:  January 16, 2021. FINDINGS: Stable cardiomediastinal silhouette. Left-sided  PICC line is unchanged in position. Large left lung opacity is noted concerning for pneumonia or atelectasis with associated pleural effusion. Right basilar subsegmental atelectasis is noted. Status post right shoulder arthroplasty. IMPRESSION: Large left lung opacity is again noted concerning for pneumonia or atelectasis with associated pleural effusion. Electronically Signed   By: Marijo Conception M.D.   On: 01/17/2021 09:30   DG Abd 1 View  Result Date: 01/17/2021 CLINICAL DATA:  Abdominal pain. EXAM: ABDOMEN - 1 VIEW COMPARISON:  January 15, 2021. FINDINGS: The bowel gas pattern is normal. No radio-opaque calculi or other significant radiographic abnormality are seen. IMPRESSION: Negative. Electronically Signed   By: Marijo Conception M.D.   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 Patient now has systemic evidence of infection with a complex fluid collection versus hematoma in the left subdiaphragmatic space with an associated left-sided pleural effusion. He presents for thoracentesis and percutaneous abscess drain placement. EXAM: ULTRASOUND GUIDED ABSCESS DRAINAGE; US THORACENTESIS ASP PLEURAL SPACE W/IMG GUIDE MEDICATIONS: The patient is currently admitted to the hospital and receiving intravenous antibiotics. The antibiotics were administered within an appropriate time frame prior to the initiation of the procedure. ANESTHESIA/SEDATION: Fentanyl 75 mcg IV; Versed 1.5 mg IV Moderate Sedation Time: 27 minutes The patient was continuously monitored during the procedure by the interventional radiology nurse under my direct supervision. COMPLICATIONS: None immediate. PROCEDURE: Informed written consent was obtained from the patient after a thorough discussion of the procedural risks, benefits and alternatives. All  questions were addressed. Maximal Sterile Barrier Technique was utilized including caps, mask, sterile gowns, sterile gloves, sterile drape, hand hygiene and skin antiseptic. A timeout was performed prior to the initiation of the procedure. Attention was first turned to the right-sided pleural effusion. The right thorax was interrogated with ultrasound. A minimally complex pleural effusion is identified. Local anesthesia was attained by infiltration with 1% lidocaine. A small dermatotomy was made. Under real-time ultrasound guidance, a 5 [place] Yueh centesis catheter was advanced in the pleural space. The catheter was then attached to a evacuated bottle and thoracentesis was performed yielding approximately 600 mL serosanguineous fluid. A sample was reserved for labs. The Yueh centesis catheter was removed and a Band-Aid applied. Next, the left upper quadrant subdiaphragmatic collection was assessed sonographically. A highly complex fluid collection is present in the left upper quadrant. A suitable skin entry site was again selected and marked. Local anesthesia was attained by infiltration with 1% lidocaine. A small dermatotomy was made. Under real-time ultrasound guidance, a 17 gauge introducer needle was advanced into the fluid collection. Through this, a 0.035 wire was advanced into the fluid collection. The introducer  needle was removed. The tract was dilated to 3 [place]. A Cook 12 [place] all-purpose drainage catheter was advanced over the wire and into the fluid collection. Aspiration yields thick turbid bloody fluid. Approximately 40 mL was successfully aspirated. The catheter was then connected to JP bulb suction and secured to the skin with 0 Prolene suture and an adhesive fixation device. The patient tolerated the procedure well. IMPRESSION: 1. Successful left thoracentesis yielding 600 mL serosanguineous pleural fluid. 2. Successful placement of a 12 French drainage catheter into the complex fluid  collection in the left subdiaphragmatic space with aspiration of thick, turbid bloody fluid consistent with hematoma. A sample was sent for Gram stain and culture. Electronically Signed   By: Jacqulynn Cadet M.D.   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 Patient now has systemic evidence of infection with a complex fluid collection versus hematoma in the left subdiaphragmatic space with an associated left-sided pleural effusion. He presents for thoracentesis and percutaneous abscess drain placement. EXAM: ULTRASOUND GUIDED ABSCESS DRAINAGE; US THORACENTESIS ASP PLEURAL SPACE W/IMG GUIDE MEDICATIONS: The patient is currently admitted to the hospital and receiving intravenous antibiotics. The antibiotics were administered within an appropriate time frame prior to the initiation of the procedure. ANESTHESIA/SEDATION: Fentanyl 75 mcg IV; Versed 1.5 mg IV Moderate Sedation Time: 27 minutes The patient was continuously monitored during the procedure by the interventional radiology nurse under my direct supervision. COMPLICATIONS: None immediate. PROCEDURE: Informed written consent was obtained from the patient after a thorough discussion of the procedural risks, benefits and alternatives. All questions were addressed. Maximal Sterile Barrier Technique was utilized including caps, mask, sterile gowns, sterile gloves, sterile drape, hand hygiene and skin antiseptic. A timeout was performed prior to the initiation of the procedure. Attention was first turned to the right-sided pleural effusion. The right thorax was interrogated with ultrasound. A minimally complex pleural effusion is identified. Local anesthesia was attained by infiltration with 1% lidocaine. A small dermatotomy was made. Under  real-time ultrasound guidance, a 5 [place] Yueh centesis catheter was advanced in the pleural space. The catheter was then attached to a evacuated bottle and thoracentesis was performed yielding approximately 600 mL serosanguineous fluid. A sample was reserved for labs. The Yueh centesis catheter was removed and a Band-Aid applied. Next, the left upper quadrant subdiaphragmatic collection was assessed sonographically. A highly complex fluid collection is present in the left upper quadrant. A suitable skin entry site was again selected and marked. Local anesthesia was attained by infiltration with 1% lidocaine. A small dermatotomy was made. Under real-time ultrasound guidance, a 17 gauge introducer needle was advanced into the fluid collection. Through this, a 0.035 wire was advanced into the fluid collection. The introducer needle was removed. The tract was dilated to 60 [place]. A Cook 12 [place] all-purpose drainage catheter was advanced over the wire and into the fluid collection. Aspiration yields thick turbid bloody fluid. Approximately 40 mL was successfully aspirated. The catheter was then connected to JP bulb suction and secured to the skin with 0 Prolene suture and an adhesive fixation device. The patient tolerated the procedure well. IMPRESSION: 1. Successful left thoracentesis yielding 600 mL serosanguineous pleural fluid. 2. Successful placement of a 12 French drainage catheter into the complex fluid collection in the left subdiaphragmatic space with aspiration of thick, turbid bloody fluid consistent with hematoma. A  sample was sent for Gram stain and culture. Electronically Signed   By: Jacqulynn Cadet M.D.   On: 01/17/2021 16:32    Anti-infectives: Anti-infectives (From admission, onward)    Start     Dose/Rate Route Frequency Ordered Stop   01/18/21 0615  vancomycin (VANCOREADY) IVPB 1250 mg/250 mL        1,250 mg 166.7 mL/hr over 90 Minutes Intravenous Every 24 hours 01/18/21 0517      01/17/21 1330  Ampicillin-Sulbactam (UNASYN) 3 g in sodium chloride 0.9 % 100 mL IVPB        3 g 200 mL/hr over 30 Minutes Intravenous Every 8 hours 01/17/21 1239     01/09/21 1100  piperacillin-tazobactam (ZOSYN) IVPB 3.375 g  Status:  Discontinued        3.375 g 12.5 mL/hr over 240 Minutes Intravenous Every 8 hours 01/09/21 1000 01/12/21 0835   01/09/21 0830  ceFEPIme (MAXIPIME) 2 g in sodium chloride 0.9 % 100 mL IVPB  Status:  Discontinued        2 g 200 mL/hr over 30 Minutes Intravenous Every 12 hours 01/09/21 0737 01/09/21 1000   01/08/21 1400  metroNIDAZOLE (FLAGYL) IVPB 500 mg  Status:  Discontinued        500 mg 100 mL/hr over 60 Minutes Intravenous Every 12 hours 01/08/21 1059 01/09/21 1000   01/08/21 1400  ceFEPIme (MAXIPIME) 2 g in sodium chloride 0.9 % 100 mL IVPB  Status:  Discontinued        2 g 200 mL/hr over 30 Minutes Intravenous Every 24 hours 01/08/21 1114 01/09/21 0737   01/08/21 1000  vancomycin (VANCOREADY) IVPB 750 mg/150 mL  Status:  Discontinued        750 mg 150 mL/hr over 60 Minutes Intravenous Every 24 hours 01/07/21 2052 01/09/21 1000   01/08/21 0600  vancomycin (VANCOREADY) IVPB 1500 mg/300 mL  Status:  Discontinued        1,500 mg 150 mL/hr over 120 Minutes Intravenous Every 24 hours 01/07/21 0430 01/07/21 1210   01/08/21 0600  vancomycin (VANCOREADY) IVPB 750 mg/150 mL  Status:  Discontinued        750 mg 150 mL/hr over 60 Minutes Intravenous Every 24 hours 01/07/21 1210 01/07/21 2052   01/07/21 1400  piperacillin-tazobactam (ZOSYN) IVPB 3.375 g  Status:  Discontinued        3.375 g 12.5 mL/hr over 240 Minutes Intravenous Every 8 hours 01/07/21 0430 01/08/21 1114   01/07/21 0530  piperacillin-tazobactam (ZOSYN) IVPB 3.375 g        3.375 g 100 mL/hr over 30 Minutes Intravenous STAT 01/07/21 0430 01/07/21 0622   01/07/21 0530  vancomycin (VANCOREADY) IVPB 1750 mg/350 mL        1,750 mg 175 mL/hr over 120 Minutes Intravenous  Once 01/07/21 0430 01/07/21  0841   01/05/21 1430  Ampicillin-Sulbactam (UNASYN) 3 g in sodium chloride 0.9 % 100 mL IVPB        3 g 200 mL/hr over 30 Minutes Intravenous Every 6 hours 01/05/21 1334 01/06/21 1552   01/01/21 2200  piperacillin-tazobactam (ZOSYN) IVPB 3.375 g        3.375 g 12.5 mL/hr over 240 Minutes Intravenous Every 8 hours 01/01/21 1841 01/04/21 2359   01/01/21 2100  piperacillin-tazobactam (ZOSYN) IVPB 3.375 g  Status:  Discontinued        3.375 g 100 mL/hr over 30 Minutes Intravenous Every 8 hours 01/01/21 1836 01/01/21 1841   01/01/21 1345  piperacillin-tazobactam (ZOSYN) IVPB 3.375  g        3.375 g 100 mL/hr over 30 Minutes Intravenous  Once 01/01/21 1337 01/01/21 1430        Assessment/Plan POD 17, s/p exploratory laparotomy, ileocecectomy, drainage of intraabdominal abscess 01/01/21 Dr. Grandville Silos for perforated appendicitis with abscess - Drain removed 10/24 - VAC change M/W/F to midline - PT/OT rec SNF at discharge   POD 11, s/p ex lap, splenectomy, application of incisional wound vac 10/14. Dr. Bobbye Morton for splenic laceration - CT 10/24 with complex LUQ fluid collection - s/p IR drain 10/25 yielding dark bloody fluid, gram stain with gram negative rods. On unasyn - WBC is trending down but patient remains febrile. Continue drain and IV antibiotics, follow culture. Diet as tolerated. Add bowel regimen. Discussed with primary team, if patient does not start to make improvements over the next couple of days he may transition to comfort care. Palliative team is following.    FEN: TNA, HH/CM diet VTE: SQH  ID: Zosyn 10/9>> 10/20, unasyn 10/25>>   Below per primary team: hemorrhagic shock 2/2 above s/p 6 u PRBC, 2 u FFP 10/14 Septic shock 2/2 perforated appendicitis  EtOH abuse Alcohol related cardiomyopathy T2DM Chronic anemia Hx of TIA HTN HLD Gout Hx of GIB CAD Tobacco abuse L pleural effusion s/p thoracentesis 10/25   LOS: 17 days    Wellington Hampshire, [facility]  Surgery 01/18/2021, 8:39 AM Please see Amion for pager number during day hours 7:00am-4:30pm

## 2021-01-18 NOTE — Progress Notes (Signed)
This chaplain responded to PMT NP-Michelle's referral for documenting the Pt. HCPOA.  The chaplain understands the Pt. trusts the Pt.daughter-Crystal and family to make medical decisions for him, if he can not make them himself. The Pt. expressed he has no interest in completing HCPOA documents.  The chaplain updated the Pt. RN-Kyra and the PMT.  The chaplain participated in reflective listening with the Pt. 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  The chaplain learned "artist" applies to the Pt. in his talent and profession.  This chaplain is available for F/U spiritual care as needed.  Chaplain Sallyanne Kuster [phone]

## 2021-01-19 ENCOUNTER — Inpatient Hospital Stay (HOSPITAL_COMMUNITY): Payer: Medicare HMO

## 2021-01-19 DIAGNOSIS — Z515 Encounter for palliative care: Secondary | ICD-10-CM | POA: Diagnosis not present

## 2021-01-19 LAB — COMPREHENSIVE METABOLIC PANEL
ALT: 42 U/L (ref 0–44)
AST: 45 U/L — ABNORMAL HIGH (ref 15–41)
Albumin: 1.5 g/dL — ABNORMAL LOW (ref 3.5–5.0)
Alkaline Phosphatase: 226 U/L — ABNORMAL HIGH (ref 38–126)
Anion gap: 7 (ref 5–15)
BUN: 34 mg/dL — ABNORMAL HIGH (ref 8–23)
CO2: 27 mmol/L (ref 22–32)
Calcium: 9.1 mg/dL (ref 8.9–10.3)
Chloride: 105 mmol/L (ref 98–111)
Creatinine, Ser: 1.46 mg/dL — ABNORMAL HIGH (ref 0.61–1.24)
GFR, Estimated: 53 mL/min — ABNORMAL LOW (ref >60)
Glucose, Bld: 191 mg/dL — ABNORMAL HIGH (ref 70–99)
Potassium: 4.2 mmol/L (ref 3.5–5.1)
Sodium: 139 mmol/L (ref 135–145)
Total Bilirubin: 1.3 mg/dL — ABNORMAL HIGH (ref 0.3–1.2)
Total Protein: 6.1 g/dL — ABNORMAL LOW (ref 6.5–8.1)

## 2021-01-19 LAB — CULTURE, BLOOD (ROUTINE X 2): Special Requests: ADEQUATE

## 2021-01-19 LAB — BPAM RBC
Blood Product Expiration Date: 202211142359
ISSUE DATE / TIME: 202210261449
Unit Type and Rh: 5100

## 2021-01-19 LAB — GLUCOSE, CAPILLARY
Glucose-Capillary: 134 mg/dL — ABNORMAL HIGH (ref 70–99)
Glucose-Capillary: 142 mg/dL — ABNORMAL HIGH (ref 70–99)
Glucose-Capillary: 157 mg/dL — ABNORMAL HIGH (ref 70–99)
Glucose-Capillary: 161 mg/dL — ABNORMAL HIGH (ref 70–99)
Glucose-Capillary: 182 mg/dL — ABNORMAL HIGH (ref 70–99)
Glucose-Capillary: 193 mg/dL — ABNORMAL HIGH (ref 70–99)

## 2021-01-19 LAB — TYPE AND SCREEN
ABO/RH(D): O POS
Antibody Screen: NEGATIVE
Unit division: 0

## 2021-01-19 LAB — CBC
HCT: 24.3 % — ABNORMAL LOW (ref 39.0–52.0)
Hemoglobin: 7.7 g/dL — ABNORMAL LOW (ref 13.0–17.0)
MCH: 29.3 pg (ref 26.0–34.0)
MCHC: 31.7 g/dL (ref 30.0–36.0)
MCV: 92.4 fL (ref 80.0–100.0)
Platelets: 523 10*3/uL — ABNORMAL HIGH (ref 150–400)
RBC: 2.63 MIL/uL — ABNORMAL LOW (ref 4.22–5.81)
RDW: 18.4 % — ABNORMAL HIGH (ref 11.5–15.5)
WBC: 13 10*3/uL — ABNORMAL HIGH (ref 4.0–10.5)
nRBC: 0.3 % — ABNORMAL HIGH (ref 0.0–0.2)

## 2021-01-19 LAB — CYTOLOGY - NON PAP

## 2021-01-19 LAB — PHOSPHORUS: Phosphorus: 4.7 mg/dL — ABNORMAL HIGH (ref 2.5–4.6)

## 2021-01-19 LAB — BRAIN NATRIURETIC PEPTIDE: B Natriuretic Peptide: 177.6 pg/mL — ABNORMAL HIGH (ref 0.0–100.0)

## 2021-01-19 LAB — MAGNESIUM: Magnesium: 2.2 mg/dL (ref 1.7–2.4)

## 2021-01-19 LAB — PROCALCITONIN: Procalcitonin: 0.41 ng/mL

## 2021-01-19 MED ORDER — TRAVASOL 10 % IV SOLN
INTRAVENOUS | Status: AC
  Filled 2021-01-19: qty 1036.8

## 2021-01-19 MED ORDER — SODIUM CHLORIDE 0.9 % IV SOLN
2.0000 g | INTRAVENOUS | Status: DC
  Administered 2021-01-19 – 2021-02-06 (×19): 2 g via INTRAVENOUS
  Filled 2021-01-19 (×19): qty 20

## 2021-01-19 MED ORDER — MORPHINE SULFATE (PF) 2 MG/ML IV SOLN
1.0000 mg | Freq: Once | INTRAVENOUS | Status: AC
Start: 2021-01-19 — End: 2021-01-19
  Administered 2021-01-19: 1 mg via INTRAVENOUS
  Filled 2021-01-19: qty 1

## 2021-01-19 MED ORDER — METOPROLOL TARTRATE 25 MG PO TABS
25.0000 mg | ORAL_TABLET | Freq: Two times a day (BID) | ORAL | Status: DC
  Administered 2021-01-19 – 2021-02-01 (×27): 25 mg via ORAL
  Filled 2021-01-19 (×28): qty 1

## 2021-01-19 MED ORDER — METRONIDAZOLE 500 MG PO TABS
500.0000 mg | ORAL_TABLET | Freq: Two times a day (BID) | ORAL | Status: DC
  Administered 2021-01-19 – 2021-02-06 (×37): 500 mg via ORAL
  Filled 2021-01-19 (×37): qty 1

## 2021-01-19 MED ORDER — FUROSEMIDE 10 MG/ML IJ SOLN
20.0000 mg | Freq: Once | INTRAMUSCULAR | Status: AC
  Administered 2021-01-19: 20 mg via INTRAVENOUS
  Filled 2021-01-19: qty 2

## 2021-01-19 NOTE — Progress Notes (Signed)
PROGRESS NOTE        PATIENT DETAILS Name: Travis [name] Age: 65 y.o. Sex: male Date of Birth: 03/11/1956 Admit Date: 01/01/2021 Admitting Physician Georganna Skeans, MD LHT:DSKAJ, Alyson Locket, NP  Brief Narrative: Patient is a 65 y.o. male with history of HFrEF, CKD stage III, COPD, HTN, DM-2, HLD, nonobstructive CAD-who presented with abdominal pain-found to have sepsis from ruptured appendicitis-underwent exploratory laparotomy/ileocecectomy and drainage of intra-abdominal abscess.Patient was briefly managed in the ICU-unfortunately-on 10/14 he developed severe abdominal pain -he was then found to have hemorrhagic shock due to a spontaneous splenic rupture-he was reevaluated by general surgery and underwent a splenectomy.  He was readmitted to the ICU-remained on the ventilator-he self extubated on 10/16-he was monitored closely-upon further stability-transfer to the [facility] hospitalist service on 10/20.  Significant events: 10/09>>admit- ex lap, remained intubated post op-to ICU 10/10>>extubated 10/12>> transferred to [facility] 10/14>>hypotensive/abd pain-CT showed spleenc rupture-to OR for splenectomy. 10/16>> Self-extubated  10/18>> Increased O2 requirement to 8L 10/19>> O2 weaned to 4L 10/20>> transferred to [facility]    Radiology/Echo 10/09>> CT abdomen: Acute appendicitis with perforation 10/14>> CT abdomen: Splenic rupture with large splenic hematoma 10/15>> TTE: EF 45-50% 10/20>> CT abdomen/pelvis: Indeterminate 2.4 cm fluid collection in the right lower quadrant adjacent to the cecal resection site.  Moderate left pleural effusion with compressive atelectasis. 10/24 >> CT - 1. Splenectomy with organizing complex fluid collections in the left upper quadrant. The largest collection there is 162 mL. Smaller adjacent collections, some inseparable from the gastric serosa and with associated mass effect and architectural distortion on the stomach. Again favor  hematoma/seroma. Abscess is not excluded, but there is no gas within the collections. 2. Stable small discontiguous rim enhancing fluid collections in the lower small bowel mesentery at the pelvic inlet a short distance from the percutaneous drain. These have not significantly changed and collectively are less than 10 mL  10/27 >> CT - 1. Interval placement of percutaneous pigtail drain catheter in the left upper quadrant within an air and fluid collection, the dominant component in which the catheter is placed measuring 7.9 x 6.6 cm, previously 8.5 x 6.2 cm. 2. Previously seen small, rim enhancing fluid collection in the right lower quadrant is not significantly changed, measuring 1.5 cm. 3. Redemonstrated postoperative findings status post appendectomy and splenectomy. There has been interval removal of a surgical drain in the right lower quadrant. 4. Cholelithiasis   Subjective: Patient in bed, appears little confused, denies any headache, no fever, no chest pain or pressure, no shortness of breath , some R.sided abdominal pain. No new focal weakness.  Objective: Vitals: Blood pressure 122/74, pulse 92, temperature 98.9 F (37.2 C), temperature source Oral, resp. rate 20, height 6' (1.829 m), weight 94.7 kg, SpO2 98 %.   Exam:  Awake mildly confused, No new F.N deficits,   [place].AT,PERRAL Supple Neck, No JVD,   Symmetrical Chest wall movement, Good air movement bilaterally, CTAB RRR,No Gallops, Rubs or new Murmurs,  +ve B.Sounds, W Vac and drain in place No Cyanosis, Clubbing or edema,        Assessment/Plan:   Sepsis due to acute appendicitis with perforation/intra-abdominal abscesses and Bacteroides bacteremia (s/p laparotomy/ileocecectomy and drainage of intra-abdominal abscess on 10/9) is finished Zosyn course for bacteroides, now growing E. coli from 01/17/2021 in the abscess: Sepsis physiology has resolved-Zosyn was discontinued on 01/12/21 .  On clear liquid diet + TNA.  CT abdomen  on 01/16/21 and 01/19/2021 noted -  he had started spiking fevers again on 01/16/2021, case was discussed with general surgery IR placed another drain in the left upper quadrant to drain residual fluid on 01/17/2021, also underwent left-sided thoracentesis on 01/17/2021 draining 600 cc of exudative fluid likely sympathetic effusion with negative gram stain, 1 out of 2 positive blood cultures likely contamination from staph epidermis stop vancomycin, antibiotics currently Rocephin and Flagyl combination in the light of present abscess cultures positive for E. coli.  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    Acute hypoxic respiratory failure due to atelectasis/pleural effusion: Self extubated on 10/16-stable on anywhere from 2-4 L of oxygen-did develop some transient shortness of breath on 10/20-which responded to bronchodilators/diuretics.    Patient appears to have a moderate-sized left-sided pleural effusion which is likely transudative from-HFrEF/reactive to splenectomy.  PRN Lasix, also IR to do ultrasound-guided thoracentesis on 01/17/2021 I do not expect this to be parapneumonic or infectious .   Acute metabolic encephalopathy: Rapidly improving-continue supportive care.  AKI on CKD stage IIIa: AKI hemodynamically mediated-hold Lasix, post hydration and packed RBC transfusion, improved.  HTN: BP stable-continue Coreg-losartan remains on hold  Nonischemic cardiomyopathy/HFrEF: Not in exacerbation-continue Lasix and monitor volume status closely.  Follow electrolytes.   Nonobstructive CAD: No anginal symptoms-already on beta-blocker-we will resume aspirin/statin over the next few days when more stable.  DM-2 (A1c 6.4 on 06/02/2020): CBGs stable-continue SSI  Recent Labs    01/19/21 0401 01/19/21 0812 01/19/21 1240  GLUCAP 182* 142* 161*     Anemia  of chronic disease and acute disease.  1 unit of packed RBC transfusion on 01/18/2021, no signs of active bleeding, stable posttransfusion H&H.  HLD: Resume statin/fenofibrate when oral intake was stable.  COPD: Stable-continue bronchodilators  Functional quadriplegia/debility/deconditioning: Due to acute illness-continue PT/OT eval-either SNF or LTAC on discharge.  Other issues: Has subclavian TLC since 10/14-this was removed on 10/21-he now has a PICC line through which TNA is infusing.  Nutrition Status: Nutrition Problem: Moderate Malnutrition Etiology: acute illness, chronic illness (perforated appendix, CAD, cardiomyopathy) Signs/Symptoms: mild muscle depletion, mild fat depletion Interventions: TPN  Obesity: Estimated body mass index is 28.32 kg/m as calculated from the following:   Height as of this encounter: 6' (1.829 m).   Weight as of this encounter: 94.7 kg.    Procedures: 10/9>>exploratory laparotomy, ileocecectomy, drainage of intraabdominal abscess  10/14>> exploratory laparotomy and splenectomy 10/14>> left subclavian triple-lumen catheter 01/17/2021  >> left ultrasound-guided thoracentesis 600 cc exudative fluid, gram stain -ve 01/17/2021  >> LUQ JP drain by IR   Consults: General surgery, PCCM DVT Prophylaxis: Heparin Code Status:DNR Family Communication: Sister-in-law at bedside on 01/15/21.  Niece [name] [phone] over the phone on 01/17/2021  Time spent: 35 minutes-Greater than 50% of this time was spent in counseling, explanation of diagnosis, planning of further management, and coordination of care.  Diet: Diet Order             Diet heart healthy/carb modified Room service appropriate? Yes; Fluid consistency: Thin  Diet effective now                    Disposition Plan: Status is: Inpatient  Remains inpatient appropriate because: Requires inpatient level of care.   Barriers to Discharge: Perforated appendix with pelvic  abscess/splenic rupture-s/p laparotomy/splenectomy-not  yet stable for discharge.     MEDICATIONS: Scheduled Meds:  acetaminophen  1,000 mg Oral Q6H   Chlorhexidine Gluconate Cloth  6 each Topical Daily   docusate sodium  100 mg Oral BID   feeding supplement  237 mL Oral TID BM   heparin injection (subcutaneous)  5,000 Units Subcutaneous Q8H   insulin aspart  0-20 Units Subcutaneous Q4H   mouth rinse  15 mL Mouth Rinse BID   methocarbamol  500 mg Oral QID   metoprolol tartrate  25 mg Oral BID   metroNIDAZOLE  500 mg Oral Q12H   mirtazapine  7.5 mg Oral QHS   oxyCODONE  20 mg Oral Q12H   pantoprazole sodium  40 mg Oral Daily   polyethylene glycol  17 g Oral Daily   Continuous Infusions:  sodium chloride     cefTRIAXone (ROCEPHIN)  IV     TPN ADULT (ION) 80 mL/hr at 01/18/21 1747   TPN ADULT (ION)     PRN Meds:.Place/Maintain arterial line **AND** sodium chloride, bisacodyl, dextromethorphan, HYDROmorphone (DILAUDID) injection, hydroxypropyl methylcellulose / hypromellose, ipratropium-albuterol, ondansetron (ZOFRAN) IV, oxyCODONE, phenol, sodium chloride flush   I have personally reviewed following labs and imaging studies  LABORATORY DATA:  Recent Labs  Lab 01/15/21 0425 01/16/21 0522 01/17/21 0424 01/18/21 0359 01/18/21 2000 01/19/21 0226  WBC 16.0* 19.5* 18.6* 14.9*  --  13.0*  HGB 7.6* 7.7* 7.4* 7.0* 8.1* 7.7*  HCT 23.5* 24.2* 23.7* 22.4* 25.4* 24.3*  PLT 552* 565* 584* 562*  --  523*  MCV 95.1 94.9 96.0 96.6  --  92.4  MCH 30.8 30.2 30.0 30.2  --  29.3  MCHC 32.3 31.8 31.2 31.3  --  31.7  RDW 15.9* 15.8* 16.1* 16.3*  --  18.4*    Recent Labs  Lab 01/13/21 0502 01/14/21 0341 01/16/21 0522 01/16/21 0536 01/17/21 0424 01/18/21 0359 01/19/21 0226  NA 135 135 136  --  137 139 139  K 4.3 4.4 4.0  --  4.8 4.6 4.2  CL 104 100 97*  --  99 104 105  CO2 26 27 29   --  26 27 27   GLUCOSE 238* 296* 201*  --  205* 166* 191*  BUN 24* 30* 43*  --  52* 42* 34*   CREATININE 0.93 1.09 1.43*  --  1.80* 1.58* 1.46*  CALCIUM 8.9 9.0 8.9  --  9.2 9.0 9.1  AST  --   --  27  --  44* 40 45*  ALT  --   --  25  --  36 37 42  ALKPHOS  --   --  171*  --  230* 230* 226*  BILITOT  --   --  1.1  --  1.1 1.2 1.3*  ALBUMIN  --   --  1.7*  --  1.8* <1.5* 1.5*  MG 2.1  --  2.1  --   --   --  2.2  PROCALCITON  --   --  0.65  --  0.80 0.57 0.41  LATICACIDVEN  --   --   --  0.9 1.5  --   --   BNP  --   --  61.3  --  74.1 212.5* 177.6*          RADIOLOGY STUDIES/RESULTS: CT ABDOMEN PELVIS WO CONTRAST  Result Date: 01/19/2021 CLINICAL DATA:  Abdominal pain, history of ruptured appendicitis EXAM: CT ABDOMEN AND PELVIS WITHOUT CONTRAST TECHNIQUE: Multidetector CT imaging of the abdomen and pelvis was  performed following the standard protocol without IV contrast. COMPARISON:  01/16/2021 FINDINGS: Lower chest: No acute abnormality. Hepatobiliary: No solid liver abnormality is seen. Small gallstone near the gallbladder neck (series 3, image 39). Gallbladder wall thickening, or biliary dilatation. Pancreas: Unremarkable. No pancreatic ductal dilatation or surrounding inflammatory changes. Spleen: Status post splenectomy. Interval placement of percutaneous pigtail drain catheters in the left upper quadrant within an air and fluid collection, the dominant component in which the catheter is placed measuring 7.9 x 6.6 cm, previously 8.5 x 6.2 cm (series 3, image 21). Adrenals/Urinary Tract: Adrenal glands are unremarkable. Kidneys are normal, without renal calculi, solid lesion, or hydronephrosis. Bladder is unremarkable. Stomach/Bowel: Stomach is within normal limits. Enteric contrast present in the colon. Redemonstrated postoperative findings status post appendectomy. There has been interval removal of a surgical drain in the right lower quadrant. Previously seen small, rim enhancing fluid collection in the right lower quadrant is not significantly changed, measuring 1.5 cm (series  3, image 65). Vascular/Lymphatic: Aortic atherosclerosis. No enlarged abdominal or pelvic lymph nodes. Reproductive: No mass or other significant abnormality. Other: No abdominal wall hernia or abnormality. No abdominopelvic ascites. Musculoskeletal: No acute or significant osseous findings. Status post right hip total arthroplasty. IMPRESSION: 1. Interval placement of percutaneous pigtail drain catheter in the left upper quadrant within an air and fluid collection, the dominant component in which the catheter is placed measuring 7.9 x 6.6 cm, previously 8.5 x 6.2 cm. 2. Previously seen small, rim enhancing fluid collection in the right lower quadrant is not significantly changed, measuring 1.5 cm. 3. Redemonstrated postoperative findings status post appendectomy and splenectomy. There has been interval removal of a surgical drain in the right lower quadrant. 4. Cholelithiasis. Aortic Atherosclerosis (ICD10-I70.0). Electronically Signed   By: Delanna Ahmadi M.D.   On: 01/19/2021 12:32   DG Chest 1 View  Result Date: 01/17/2021 CLINICAL DATA:  Status post left thoracentesis. EXAM: CHEST  1 VIEW COMPARISON:  Chest x-ray obtained earlier today FINDINGS: No evidence of pneumothorax. Reduced left pleural effusion. New percutaneous drainage catheter projects over the left upper quadrant. Left upper extremity PICC. Catheter tip terminates over the right atrium. Very low lung volumes. Pulmonary vascular congestion. IMPRESSION: No evidence of pneumothorax or other complication following left-sided thoracentesis. Electronically Signed   By: Jacqulynn Cadet M.D.   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 Patient now has systemic evidence of infection with a complex fluid collection versus hematoma in the left subdiaphragmatic  space with an associated left-sided pleural effusion. He presents for thoracentesis and percutaneous abscess drain placement. EXAM: ULTRASOUND GUIDED ABSCESS DRAINAGE; US THORACENTESIS ASP PLEURAL SPACE W/IMG GUIDE MEDICATIONS: The patient is currently admitted to the hospital and receiving intravenous antibiotics. The antibiotics were administered within an appropriate time frame prior to the initiation of the procedure. ANESTHESIA/SEDATION: Fentanyl 75 mcg IV; Versed 1.5 mg IV Moderate Sedation Time: 27 minutes The patient was continuously monitored during the procedure by the interventional radiology nurse under my direct supervision. COMPLICATIONS: None immediate. PROCEDURE: Informed written consent was obtained from the patient after a thorough discussion of the procedural risks, benefits and alternatives. All questions were addressed. Maximal Sterile Barrier Technique was utilized including caps, mask, sterile gowns, sterile gloves, sterile drape, hand hygiene and skin antiseptic. A timeout was performed prior to the initiation of the procedure. Attention was first turned to  the right-sided pleural effusion. The right thorax was interrogated with ultrasound. A minimally complex pleural effusion is identified. Local anesthesia was attained by infiltration with 1% lidocaine. A small dermatotomy was made. Under real-time ultrasound guidance, a 5 [place] Yueh centesis catheter was advanced in the pleural space. The catheter was then attached to a evacuated bottle and thoracentesis was performed yielding approximately 600 mL serosanguineous fluid. A sample was reserved for labs. The Yueh centesis catheter was removed and a Band-Aid applied. Next, the left upper quadrant subdiaphragmatic collection was assessed sonographically. A highly complex fluid collection is present in the left upper quadrant. A suitable skin entry site was again selected and marked. Local anesthesia was attained by infiltration with 1%  lidocaine. A small dermatotomy was made. Under real-time ultrasound guidance, a 17 gauge introducer needle was advanced into the fluid collection. Through this, a 0.035 wire was advanced into the fluid collection. The introducer needle was removed. The tract was dilated to 57 [place]. A Cook 12 [place] all-purpose drainage catheter was advanced over the wire and into the fluid collection. Aspiration yields thick turbid bloody fluid. Approximately 40 mL was successfully aspirated. The catheter was then connected to JP bulb suction and secured to the skin with 0 Prolene suture and an adhesive fixation device. The patient tolerated the procedure well. IMPRESSION: 1. Successful left thoracentesis yielding 600 mL serosanguineous pleural fluid. 2. Successful placement of a 12 French drainage catheter into the complex fluid collection in the left subdiaphragmatic space with aspiration of thick, turbid bloody fluid consistent with hematoma. A sample was sent for Gram stain and culture. Electronically Signed   By: Jacqulynn Cadet M.D.   On: 01/17/2021 16:32   DG Chest Port 1 View  Result Date: 01/19/2021 CLINICAL DATA:  Shortness of breath. EXAM: PORTABLE CHEST 1 VIEW COMPARISON:  January 17, 2021. FINDINGS: Stable cardiomediastinal silhouette. Left-sided pleural drainage catheter is unchanged in position. Stable small left pleural effusion is noted with associated left basilar atelectasis. Mild right basilar atelectasis is noted as well. Bony thorax is unremarkable. PICC line is unchanged. No pneumothorax is noted. IMPRESSION: Stable left-sided pleural drainage catheter is noted with associated left pleural effusion and adjacent atelectasis. Stable mild right basilar atelectasis is noted. Electronically Signed   By: Marijo Conception M.D.   On: 01/19/2021 08:01   US THORACENTESIS ASP PLEURAL SPACE W/IMG GUIDE  Result Date: 01/17/2021 INDICATION: 65 year old male status post appendectomy and partial cecectomy  complicated by spontaneous splenic rupture necessitating splenectomy several days later. Patient now has systemic evidence of infection with a complex fluid collection versus hematoma in the left subdiaphragmatic space with an associated left-sided pleural effusion. He presents for thoracentesis and percutaneous abscess drain placement. EXAM: ULTRASOUND GUIDED ABSCESS DRAINAGE; US THORACENTESIS ASP PLEURAL SPACE W/IMG GUIDE MEDICATIONS: The patient is currently admitted to the hospital and receiving intravenous antibiotics. The antibiotics were administered within an appropriate time frame prior to the initiation of the procedure. ANESTHESIA/SEDATION: Fentanyl 75 mcg IV; Versed 1.5 mg IV Moderate Sedation Time: 27 minutes The patient was continuously monitored during the procedure by the interventional radiology nurse under my direct supervision. COMPLICATIONS: None immediate. PROCEDURE: Informed written consent was obtained from the patient after a thorough discussion of the procedural risks, benefits and alternatives. All questions were addressed. Maximal Sterile Barrier Technique was utilized including caps, mask, sterile gowns, sterile gloves, sterile drape, hand hygiene and skin antiseptic. A timeout was performed prior to the initiation of the procedure. Attention was first turned to  the right-sided pleural effusion. The right thorax was interrogated with ultrasound. A minimally complex pleural effusion is identified. Local anesthesia was attained by infiltration with 1% lidocaine. A small dermatotomy was made. Under real-time ultrasound guidance, a 5 [place] Yueh centesis catheter was advanced in the pleural space. The catheter was then attached to a evacuated bottle and thoracentesis was performed yielding approximately 600 mL serosanguineous fluid. A sample was reserved for labs. The Yueh centesis catheter was removed and a Band-Aid applied. Next, the left upper quadrant subdiaphragmatic collection was  assessed sonographically. A highly complex fluid collection is present in the left upper quadrant. A suitable skin entry site was again selected and marked. Local anesthesia was attained by infiltration with 1% lidocaine. A small dermatotomy was made. Under real-time ultrasound guidance, a 17 gauge introducer needle was advanced into the fluid collection. Through this, a 0.035 wire was advanced into the fluid collection. The introducer needle was removed. The tract was dilated to 65 [place]. A Cook 12 [place] all-purpose drainage catheter was advanced over the wire and into the fluid collection. Aspiration yields thick turbid bloody fluid. Approximately 40 mL was successfully aspirated. The catheter was then connected to JP bulb suction and secured to the skin with 0 Prolene suture and an adhesive fixation device. The patient tolerated the procedure well. IMPRESSION: 1. Successful left thoracentesis yielding 600 mL serosanguineous pleural fluid. 2. Successful placement of a 12 French drainage catheter into the complex fluid collection in the left subdiaphragmatic space with aspiration of thick, turbid bloody fluid consistent with hematoma. A sample was sent for Gram stain and culture. Electronically Signed   By: Jacqulynn Cadet M.D.   On: 01/17/2021 16:32     LOS: 18 days   Signature  Lala Lund M.D on 01/19/2021 at 2:01 PM   -  To page go to [email or web address]

## 2021-01-19 NOTE — Progress Notes (Signed)
PHARMACY - TOTAL PARENTERAL NUTRITION CONSULT NOTE   Indication: Prolonged ileus  Patient Measurements: Height: 6' (182.9 cm) Weight: 94.7 kg (208 lb 12.4 oz) IBW/kg (Calculated) : 77.6   Body mass index is 28.32 kg/m.  Assessment: 65 YO male who presented 10/09 with acute perforated appendix and abdominal abscess s/p OR for ex-lap, ilececectomy, and drainage of abscess on 10/09. Also had splenic laceration, s/p ex-lap, splenectomy, and wound vac application 81/85. Transferred to ICU on 10/12 with septic vs. hemorrhagic shock, intubated and on vasopressor support. Plan was initially to transition to comfort measures 10/14; however improved overnight with decreasing vasopressor requirement. Post-op ileus. Pharmacy consulted to start TPN.  Per chart review, patient has become more confused and combative with distended abdomen since 10/24. New fevers overnight Tmax 101.8 (on APAP Q6H). Empiric antibiotics to be started today while sepsis work-up in progress. BCID positive for MRSE. 10/25 fluid cultures pending.   Palliative care was consulted 10/26 due to ongoing declining state. Per chart review, current plan of care is for another day of treatment, if patient continues to decline to pursue comfort focused care.     Glucose / Insulin: hx DM2 (no meds documented PTA). CBGs/12h 142-193 in past 24 hours with 22 units of SSI + 90 units of regular insulin in TPN bag Electrolytes: Na 139, K 4.2. (goal >/=4 with ileus), Cl/HCO2 wnl, CoCa 11.1 (Ca removed on 10/17), Phos 4.7 (10/27), Mg 2.2 (10/27) Renal: AKI on CKD improving with SCr up to 1.80 (10/25) now 1.46, BUN decreased to 34 Hepatic: AST incr to 45, ALT 42, Alk phos incr to 226. TG 139 (10/24). Tbili 1.3, alb down to 1.5 Intake / Output; MIVF: UOP improved (2613m 10/26) NGT removed 10/20 JP drain output 1525m  LBM 10/21  GI Imaging:  10/09>> CT abdomen: Acute appendicitis with perforation 10/14>> CT abdomen: Splenic rupture with large  splenic hematoma 10/20>> CT abdomen: Indeterminate 2.4 cm fluid collection in the right lower quadrant adjacent to the cecal resection site. 10/24 >> CT abdomen: Splenectomy with organizing complex fluid collections in the left upper quadrant. The largest collection there is 162 mL. Stable small discontiguous rim enhancing fluid collections in the lower small bowel mesentery at the pelvic inlet a short distance from the percutaneous drain. These have not significantly changed and collectively are less than 10 mL  GI Surgeries / Procedures: none since TPN start Central access: CVC triple lumen 10/14 >> 10/21; PICC 10/21 >> TPN start date: 01/08/21  Nutritional Goals: Goal TPN rate is 100 mL/hr (provides 130 g of protein and 2421 kcals per day)  RD Assessment: Estimated Needs Total Energy Estimated Needs: 2400-2600 Total Protein Estimated Needs: 125-150 grams Total Fluid Estimated Needs: > 2 L  Current Nutrition:  - Ensure Enlive po TID (350 kcal and 20 grams protein) 3 charted as given 10/26 - Magic cup TID (290 kcal and 9 grams of protein ) 0 charted as given  - TPN  Plan:  Continue TPN at 80 ml/hr to provide 1937 kcal and 104g protein meeting ~80% of estimated needs (discussed reduction with surgery) and help with appetite stimulation Electrolytes in TPN: Na 40 meq/L, Mg 5 mEq/L. K 35 mEq/L, Ca 0 mEq/L, Phos 15 mmol/L, Cl:Ac 1:2 Continue MVI and trace elements to TPN + folic acid and thiamine with hx ETOH abuse Continue resistant SSI q4h Increase insulin R in TPN bag to 95 units (ratio drop with change in CHO + increase due to SSI requirements) Monitor TPN labs  on Mon/Thurs F/u return of bowel function, ability to initiate tube feeds and wean off TPN   Thank you for allowing pharmacy to be a part of this patient's care.  Ardyth Harps, PharmD Clinical Pharmacist  **Pharmacist phone directory can now be found on [email or web address] ([email or web address]).  Listed under [place].

## 2021-01-19 NOTE — Progress Notes (Signed)
Referring Physician(s): Dr. Bobbye Morton  Supervising Physician: Ruthann Cancer  Patient Status:  [facility] - In-pt  Chief Complaint:  F/u Drain  Brief History:  Travis [name] is a 65 y.o. male who was admitted 01/01/21 with a ruptured appendix.   He is s/p exploratory laparotomy, ileocecectomy, drainage of intraabdominal abscess on 01/01/21 by Dr. Grandville Silos for perforated appendicitis with abscess.  He then underwent s/p ex lap, splenectomy, application of incisional wound vac on 01/06/21 by Dr. Bobbye Morton for splenic laceration   He developed worsening abdominal pain, fever, and elevated WBC.   CT done 01/16/21 showed= 1. Splenectomy with organizing complex fluid collections in the left upper quadrant. The largest collection there is 162 mL. Smaller adjacent collections, some inseparable from the gastric serosa and with associated mass effect and architectural distortion on the stomach. Again favor hematoma/seroma. Abscess is not excluded, but there is no gas within the collections. 2. Stable small discontiguous rim enhancing fluid collections in the lower small bowel mesentery at the pelvic inlet a short distance from the percutaneous drain. These have not significantly changed and collectively are less than 10 mL. 3. Moderate left pleural effusion with substantial left lower lobe atelectasis persists. Right lower lobe consolidation persists, indeterminate for atelectasis versus pneumonia. 4. No bowel obstruction or new abnormality identified in the abdomen or pelvis.  He underwent placement of a 12 fr drain by Dr. Laurence Ferrari on 01/16/21.  Subjective:  No complaints.   Allergies: Patient has no known allergies.  Medications: Prior to Admission medications   Medication Sig Start Date End Date Taking? Authorizing Provider  albuterol (VENTOLIN HFA) 108 (90 Base) MCG/ACT inhaler Inhale 2 puffs into the lungs every 6 (six) hours as needed for wheezing or shortness of breath.   Yes  [provider]  allopurinol (ZYLOPRIM) 100 MG tablet Take 1 tablet (100 mg total) by mouth 2 (two) times daily. 12/19/20  Yes [name], NP  aspirin 325 MG tablet Take 162.5 mg by mouth in the morning.   Yes [provider]  atorvastatin (LIPITOR) 20 MG tablet Take 20 mg by mouth daily.   Yes [provider]  carvedilol (COREG) 6.25 MG tablet Take 1 tablet (6.25 mg total) by mouth 2 (two) times daily. 06/03/20  Yes Theora Gianotti, NP  cetirizine (ZYRTEC) 10 MG tablet Take 1 tablet (10 mg total) by mouth daily. 07/07/20  Yes [name], NP  fenofibrate (TRICOR) 145 MG tablet TAKE 1 TABLET BY MOUTH ONCE A DAY 11/23/20  Yes Dutch Quint B, FNP  ferrous sulfate 325 (65 FE) MG tablet Take 1 tablet (325 mg total) by mouth daily with breakfast. 06/02/20  Yes Baity, Coralie Keens, NP  fluticasone (FLONASE) 50 MCG/ACT nasal spray Place 1-2 sprays into both nostrils daily as needed for allergies or rhinitis.   Yes [provider]  furosemide (LASIX) 40 MG tablet Take 1 tablet by mouth daily.   Yes [provider]  hydroxypropyl methylcellulose / hypromellose (ISOPTO TEARS / GONIOVISC) 2.5 % ophthalmic solution Place 1 drop into both eyes 3 (three) times daily as needed for dry eyes.   Yes [provider]  losartan (COZAAR) 25 MG tablet Take 0.5 tablets (12.5 mg total) by mouth daily. 12/19/20 03/19/21 Yes [name], NP  magnesium oxide (MAG-OX) 400 MG tablet Take 1 tablet (400 mg total) by mouth daily. 12/19/20  Yes [name], NP  multivitamin (ONE-A-DAY MEN'S) TABS tablet Take 1 tablet by mouth daily with  breakfast.   Yes [provider]  Omega-3 Fatty Acids (FISH OIL PO) Take 1 capsule by mouth daily.   Yes [provider]  pantoprazole (PROTONIX) 40 MG tablet Take 1 tablet (40 mg total) by mouth daily. 06/02/20  Yes Baity, Coralie Keens, NP  triamcinolone cream (KENALOG) 0.1 % APPLY TO AFFECTED AREAS TWICE A DAY AS  NEEDED. AVOID FACE, GROIN, OR UNDERARMS. Patient taking differently: Apply 1 application topically 2 (two) times daily as needed (dry skin, rash). 11/01/20  Yes Dutch Quint B, FNP     Vital Signs: BP 122/74 (BP Location: Right Arm)   Pulse 92   Temp 98.9 F (37.2 C) (Oral)   Resp 20   Ht 6' (1.829 m)   Wt 94.7 kg   SpO2 98%   BMI 28.32 kg/m   Physical Exam Vitals reviewed.  Constitutional:      Appearance: Normal appearance.  Cardiovascular:     Rate and Rhythm: Normal rate.  Pulmonary:     Effort: Pulmonary effort is normal.  Neurological:     General: No focal deficit present.     Mental Status: He is alert.  Drain Location: LUQ Size: Fr size: 12 Fr Date of placement: 01/17/21  Currently to: Drain collection device: suction bulb 24 hour output:  Output by Drain (mL) 01/17/21 0701 - 01/17/21 1900 01/17/21 1901 - 01/18/21 0700 01/18/21 0701 - 01/18/21 1900 01/18/21 1901 - 01/19/21 0700 01/19/21 0701 - 01/19/21 1336  Closed System Drain Left Abdomen Bulb (JP) 12 Fr. 50 75 30 20   Negative Pressure Wound Therapy Abdomen   50 0     Current examination: Flushes/aspirates easily.  Dark bloody fluid in tubing and bulb. Insertion site unremarkable. Suture and stat lock in place. Dressed appropriately.   Imaging: CT ABDOMEN PELVIS WO CONTRAST  Result Date: 01/19/2021 CLINICAL DATA:  Abdominal pain, history of ruptured appendicitis EXAM: CT ABDOMEN AND PELVIS WITHOUT CONTRAST TECHNIQUE: Multidetector CT imaging of the abdomen and pelvis was performed following the standard protocol without IV contrast. COMPARISON:  01/16/2021 FINDINGS: Lower chest: No acute abnormality. Hepatobiliary: No solid liver abnormality is seen. Small gallstone near the gallbladder neck (series 3, image 39). Gallbladder wall thickening, or biliary dilatation. Pancreas: Unremarkable. No pancreatic ductal dilatation or surrounding inflammatory changes. Spleen: Status post splenectomy. Interval placement  of percutaneous pigtail drain catheters in the left upper quadrant within an air and fluid collection, the dominant component in which the catheter is placed measuring 7.9 x 6.6 cm, previously 8.5 x 6.2 cm (series 3, image 21). Adrenals/Urinary Tract: Adrenal glands are unremarkable. Kidneys are normal, without renal calculi, solid lesion, or hydronephrosis. Bladder is unremarkable. Stomach/Bowel: Stomach is within normal limits. Enteric contrast present in the colon. Redemonstrated postoperative findings status post appendectomy. There has been interval removal of a surgical drain in the right lower quadrant. Previously seen small, rim enhancing fluid collection in the right lower quadrant is not significantly changed, measuring 1.5 cm (series 3, image 65). Vascular/Lymphatic: Aortic atherosclerosis. No enlarged abdominal or pelvic lymph nodes. Reproductive: No mass or other significant abnormality. Other: No abdominal wall hernia or abnormality. No abdominopelvic ascites. Musculoskeletal: No acute or significant osseous findings. Status post right hip total arthroplasty. IMPRESSION: 1. Interval placement of percutaneous pigtail drain catheter in the left upper quadrant within an air and fluid collection, the dominant component in which the catheter is placed measuring 7.9 x 6.6 cm, previously 8.5 x 6.2 cm. 2. Previously seen small, rim enhancing fluid collection in  the right lower quadrant is not significantly changed, measuring 1.5 cm. 3. Redemonstrated postoperative findings status post appendectomy and splenectomy. There has been interval removal of a surgical drain in the right lower quadrant. 4. Cholelithiasis. Aortic Atherosclerosis (ICD10-I70.0). Electronically Signed   By: Delanna Ahmadi M.D.   On: 01/19/2021 12:32   DG Chest 1 View  Result Date: 01/17/2021 CLINICAL DATA:  Status post left thoracentesis. EXAM: CHEST  1 VIEW COMPARISON:  Chest x-ray obtained earlier today FINDINGS: No evidence of  pneumothorax. Reduced left pleural effusion. New percutaneous drainage catheter projects over the left upper quadrant. Left upper extremity PICC. Catheter tip terminates over the right atrium. Very low lung volumes. Pulmonary vascular congestion. IMPRESSION: No evidence of pneumothorax or other complication following left-sided thoracentesis. Electronically Signed   By: Jacqulynn Cadet M.D.   On: 01/17/2021 16:33   DG Chest 1 View  Result Date: 01/17/2021 CLINICAL DATA:  Shortness of breath. EXAM: CHEST  1 VIEW COMPARISON:  January 16, 2021. FINDINGS: Stable cardiomediastinal silhouette. Left-sided PICC line is unchanged in position. Large left lung opacity is noted concerning for pneumonia or atelectasis with associated pleural effusion. Right basilar subsegmental atelectasis is noted. Status post right shoulder arthroplasty. IMPRESSION: Large left lung opacity is again noted concerning for pneumonia or atelectasis with associated pleural effusion. Electronically Signed   By: Marijo Conception M.D.   On: 01/17/2021 09:30   DG Abd 1 View  Result Date: 01/17/2021 CLINICAL DATA:  Abdominal pain. EXAM: ABDOMEN - 1 VIEW COMPARISON:  January 15, 2021. FINDINGS: The bowel gas pattern is normal. No radio-opaque calculi or other significant radiographic abnormality are seen. IMPRESSION: Negative. Electronically Signed   By: Marijo Conception M.D.   On: 01/17/2021 09:31   DG Abd 1 View  Result Date: 01/15/2021 CLINICAL DATA:  Abdominal pain. EXAM: ABDOMEN - 1 VIEW COMPARISON:  01/10/2021 FINDINGS: Nasogastric 2 has been withdrawn or removed. Bowel gas pattern is nonobstructive. Residual contrast is identified in nondilated loops of colon. Moderate degenerative changes in the lumbar spine. Remote RIGHT hip arthroplasty. IMPRESSION: Nonobstructive bowel gas pattern. Residual contrast in nondilated colon. Electronically Signed   By: Nolon Nations M.D.   On: 01/15/2021 13:44   [place] Abscess Drain  Result Date:  01/17/2021 INDICATION: 65 year old male status post appendectomy and partial cecectomy complicated by spontaneous splenic rupture necessitating splenectomy several days later. Patient now has systemic evidence of infection with a complex fluid collection versus hematoma in the left subdiaphragmatic space with an associated left-sided pleural effusion. He presents for thoracentesis and percutaneous abscess drain placement. EXAM: ULTRASOUND GUIDED ABSCESS DRAINAGE; US THORACENTESIS ASP PLEURAL SPACE W/IMG GUIDE MEDICATIONS: The patient is currently admitted to the hospital and receiving intravenous antibiotics. The antibiotics were administered within an appropriate time frame prior to the initiation of the procedure. ANESTHESIA/SEDATION: Fentanyl 75 mcg IV; Versed 1.5 mg IV Moderate Sedation Time: 27 minutes The patient was continuously monitored during the procedure by the interventional radiology nurse under my direct supervision. COMPLICATIONS: None immediate. PROCEDURE: Informed written consent was obtained from the patient after a thorough discussion of the procedural risks, benefits and alternatives. All questions were addressed. Maximal Sterile Barrier Technique was utilized including caps, mask, sterile gowns, sterile gloves, sterile drape, hand hygiene and skin antiseptic. A timeout was performed prior to the initiation of the procedure. Attention was first turned to the right-sided pleural effusion. The right thorax was interrogated with ultrasound. A minimally complex pleural effusion is identified. Local anesthesia was attained by  infiltration with 1% lidocaine. A small dermatotomy was made. Under real-time ultrasound guidance, a 5 [place] Yueh centesis catheter was advanced in the pleural space. The catheter was then attached to a evacuated bottle and thoracentesis was performed yielding approximately 600 mL serosanguineous fluid. A sample was reserved for labs. The Yueh centesis catheter was removed and  a Band-Aid applied. Next, the left upper quadrant subdiaphragmatic collection was assessed sonographically. A highly complex fluid collection is present in the left upper quadrant. A suitable skin entry site was again selected and marked. Local anesthesia was attained by infiltration with 1% lidocaine. A small dermatotomy was made. Under real-time ultrasound guidance, a 17 gauge introducer needle was advanced into the fluid collection. Through this, a 0.035 wire was advanced into the fluid collection. The introducer needle was removed. The tract was dilated to 32 [place]. A Cook 12 [place] all-purpose drainage catheter was advanced over the wire and into the fluid collection. Aspiration yields thick turbid bloody fluid. Approximately 40 mL was successfully aspirated. The catheter was then connected to JP bulb suction and secured to the skin with 0 Prolene suture and an adhesive fixation device. The patient tolerated the procedure well. IMPRESSION: 1. Successful left thoracentesis yielding 600 mL serosanguineous pleural fluid. 2. Successful placement of a 12 French drainage catheter into the complex fluid collection in the left subdiaphragmatic space with aspiration of thick, turbid bloody fluid consistent with hematoma. A sample was sent for Gram stain and culture. Electronically Signed   By: Jacqulynn Cadet M.D.   On: 01/17/2021 16:32   CT ABDOMEN PELVIS W CONTRAST  Result Date: 01/16/2021 CLINICAL DATA:  65 year old male status post partial colon resection and splenectomy splenectomy site seroma/hematoma. Abdominal distension. EXAM: CT ABDOMEN AND PELVIS WITH CONTRAST TECHNIQUE: Multidetector CT imaging of the abdomen and pelvis was performed using the standard protocol following bolus administration of intravenous contrast. CONTRAST:  61mL OMNIPAQUE IOHEXOL 350 MG/ML SOLN COMPARISON:  CT Chest, Abdomen and Pelvis 01/12/2021 FINDINGS: Lower chest: Moderate left pleural effusion with simple fluid density but  substantial compressive atelectasis of the left lower lobe has not regressed. No pericardial effusion. Right lower lobe consolidation persists, indeterminate for severe atelectasis (the parenchyma is enhancing) versus pneumonia. Middle lobes remain aerated. Hepatobiliary: Stable, negative. Pancreas: The pancreas remains within normal limits, although there is inflammation at the tail of the pancreas from the splenic region. Spleen: Splenectomy with complex mixed density and the larger component partially rim enhancing fluid collection in the left upper quadrant at the splenectomy bed. The larger component of organized collection there is 62 x 83 by 63 mm for an estimated volume of 162 mL. A smaller rim enhancing component is anterior and lateral to the dominant collection as seen on series 3, image 23. Associated mass effect on the gastric cardia and proximal greater curve, and some of the fluid is inseparable from the gastric serosa (series 3, image 19). No gas within the collections. Adrenals/Urinary Tract: Urinary bladder partially obscured by streak artifact from the hip arthroplasty. Stable adrenal glands and kidneys. Symmetric renal enhancement and early contrast excretion on the delayed images. Stomach/Bowel: Oral contrast throughout the residual large bowel, which appears negative from the hepatic flexure distally. Right lower quadrant percutaneous drain with regional mesenteric inflammation, mild architectural distortion at the cecum. The small discontiguous rim enhancing fluid collections in the lower small bowel mesentery at the pelvic inlet persist and are rim enhancing today on series 3, image 68, but have not increased in size. Collectively these  are less than 10 mL. Only mild inflammation of the distal small bowel now. No dilated small bowel. Duodenal diverticulum redemonstrated. Stable ventral midline abdominal postoperative changes. Mass effect and architectural distortion on the stomach is  described with the spleen. No free air identified. Vascular/Lymphatic: Aortoiliac calcified atherosclerosis. Major arterial structures in the abdomen and pelvis remain patent. Portal venous system is patent. No lymphadenopathy identified. Reproductive: Negative. Other: No pelvic free fluid. Musculoskeletal: Advanced lumbar spine degeneration. Right hip arthroplasty. No acute osseous abnormality identified. IMPRESSION: 1. Splenectomy with organizing complex fluid collections in the left upper quadrant. The largest collection there is 162 mL. Smaller adjacent collections, some inseparable from the gastric serosa and with associated mass effect and architectural distortion on the stomach. Again favor hematoma/seroma. Abscess is not excluded, but there is no gas within the collections. 2. Stable small discontiguous rim enhancing fluid collections in the lower small bowel mesentery at the pelvic inlet a short distance from the percutaneous drain. These have not significantly changed and collectively are less than 10 mL. 3. Moderate left pleural effusion with substantial left lower lobe atelectasis persists. Right lower lobe consolidation persists, indeterminate for atelectasis versus pneumonia. 4. No bowel obstruction or new abnormality identified in the abdomen or pelvis. 5. Aortic Atherosclerosis (ICD10-I70.0). Electronically Signed   By: Genevie Ann M.D.   On: 01/16/2021 04:34   DG Chest Port 1 View  Result Date: 01/19/2021 CLINICAL DATA:  Shortness of breath. EXAM: PORTABLE CHEST 1 VIEW COMPARISON:  January 17, 2021. FINDINGS: Stable cardiomediastinal silhouette. Left-sided pleural drainage catheter is unchanged in position. Stable small left pleural effusion is noted with associated left basilar atelectasis. Mild right basilar atelectasis is noted as well. Bony thorax is unremarkable. PICC line is unchanged. No pneumothorax is noted. IMPRESSION: Stable left-sided pleural drainage catheter is noted with associated  left pleural effusion and adjacent atelectasis. Stable mild right basilar atelectasis is noted. Electronically Signed   By: Marijo Conception M.D.   On: 01/19/2021 08:01   DG Chest Port 1 View  Result Date: 01/16/2021 CLINICAL DATA:  Shortness of breath.  Asthma. EXAM: PORTABLE CHEST 1 VIEW COMPARISON:  01/14/2020 FINDINGS: Left arm PICC is been placed with the tip in the mid right atrium. Withdraw 4 cm to be above the right atrium if desired. Artifact overlies the chest otherwise. Hazy opacity overlying the left chest probably relates to a layering effusion with associated dependent atelectasis. Mild atelectasis present in the right lower lobe. IMPRESSION: Left arm PICC tip in the right atrium. Withdraw 4 cm be above the right atrium if desired. Layering pleural effusion on the left with atelectatic changes of the left lung. Lesser patchy atelectasis at the right lung base. Electronically Signed   By: Nelson Chimes M.D.   On: 01/16/2021 08:38   US THORACENTESIS ASP PLEURAL SPACE W/IMG GUIDE  Result Date: 01/17/2021 INDICATION: 65 year old male status post appendectomy and partial cecectomy complicated by spontaneous splenic rupture necessitating splenectomy several days later. Patient now has systemic evidence of infection with a complex fluid collection versus hematoma in the left subdiaphragmatic space with an associated left-sided pleural effusion. He presents for thoracentesis and percutaneous abscess drain placement. EXAM: ULTRASOUND GUIDED ABSCESS DRAINAGE; US THORACENTESIS ASP PLEURAL SPACE W/IMG GUIDE MEDICATIONS: The patient is currently admitted to the hospital and receiving intravenous antibiotics. The antibiotics were administered within an appropriate time frame prior to the initiation of the procedure. ANESTHESIA/SEDATION: Fentanyl 75 mcg IV; Versed 1.5 mg IV Moderate Sedation Time: 27 minutes The  patient was continuously monitored during the procedure by the interventional radiology nurse  under my direct supervision. COMPLICATIONS: None immediate. PROCEDURE: Informed written consent was obtained from the patient after a thorough discussion of the procedural risks, benefits and alternatives. All questions were addressed. Maximal Sterile Barrier Technique was utilized including caps, mask, sterile gowns, sterile gloves, sterile drape, hand hygiene and skin antiseptic. A timeout was performed prior to the initiation of the procedure. Attention was first turned to the right-sided pleural effusion. The right thorax was interrogated with ultrasound. A minimally complex pleural effusion is identified. Local anesthesia was attained by infiltration with 1% lidocaine. A small dermatotomy was made. Under real-time ultrasound guidance, a 5 [place] Yueh centesis catheter was advanced in the pleural space. The catheter was then attached to a evacuated bottle and thoracentesis was performed yielding approximately 600 mL serosanguineous fluid. A sample was reserved for labs. The Yueh centesis catheter was removed and a Band-Aid applied. Next, the left upper quadrant subdiaphragmatic collection was assessed sonographically. A highly complex fluid collection is present in the left upper quadrant. A suitable skin entry site was again selected and marked. Local anesthesia was attained by infiltration with 1% lidocaine. A small dermatotomy was made. Under real-time ultrasound guidance, a 17 gauge introducer needle was advanced into the fluid collection. Through this, a 0.035 wire was advanced into the fluid collection. The introducer needle was removed. The tract was dilated to 63 [place]. A Cook 12 [place] all-purpose drainage catheter was advanced over the wire and into the fluid collection. Aspiration yields thick turbid bloody fluid. Approximately 40 mL was successfully aspirated. The catheter was then connected to JP bulb suction and secured to the skin with 0 Prolene suture and an adhesive fixation device. The patient  tolerated the procedure well. IMPRESSION: 1. Successful left thoracentesis yielding 600 mL serosanguineous pleural fluid. 2. Successful placement of a 12 French drainage catheter into the complex fluid collection in the left subdiaphragmatic space with aspiration of thick, turbid bloody fluid consistent with hematoma. A sample was sent for Gram stain and culture. Electronically Signed   By: Jacqulynn Cadet M.D.   On: 01/17/2021 16:32    Labs:  CBC: Recent Labs    01/16/21 0522 01/17/21 0424 01/18/21 0359 01/18/21 2000 01/19/21 0226  WBC 19.5* 18.6* 14.9*  --  13.0*  HGB 7.7* 7.4* 7.0* 8.1* 7.7*  HCT 24.2* 23.7* 22.4* 25.4* 24.3*  PLT 565* 584* 562*  --  523*    COAGS: Recent Labs    01/01/21 1340 01/06/21 0308 01/06/21 0835  INR 1.1 1.3* 1.2  APTT  --   --  26    BMP: Recent Labs    01/16/21 0522 01/17/21 0424 01/18/21 0359 01/19/21 0226  NA 136 137 139 139  K 4.0 4.8 4.6 4.2  CL 97* 99 104 105  CO2 29 26 27 27   GLUCOSE 201* 205* 166* 191*  BUN 43* 52* 42* 34*  CALCIUM 8.9 9.2 9.0 9.1  CREATININE 1.43* 1.80* 1.58* 1.46*  GFRNONAA 54* 41* 48* 53*    LIVER FUNCTION TESTS: Recent Labs    01/16/21 0522 01/17/21 0424 01/18/21 0359 01/19/21 0226  BILITOT 1.1 1.1 1.2 1.3*  AST 27 44* 40 45*  ALT 25 36 37 42  ALKPHOS 171* 230* 230* 226*  PROT 5.9* 6.4* 6.0* 6.1*  ALBUMIN 1.7* 1.8* <1.5* 1.5*    Assessment and Plan:  Splenectomy with organizing complex fluid collections in the left upper quadrant. (Dark bloody, appears to be  hematoma)  S/P Drain placement by Dr. Laurence Ferrari on 01/17/21  CT done today showed= 1. Interval placement of percutaneous pigtail drain catheter in the left upper quadrant within an air and fluid collection, the dominant component in which the catheter is placed measuring 7.9 x 6.6 cm, previously 8.5 x 6.2 cm. 2. Previously seen small, rim enhancing fluid collection in the right lower quadrant is not significantly changed,  measuring 1.5 cm.  Plan: Continue TID flushes with 5 cc NS. Record output Q shift. Dressing changes QD or PRN if soiled.  Call IR APP or on call IR MD if difficulty flushing or sudden change in drain output.  Repeat imaging/possible drain injection once output < 10 mL/QD (excluding flush material.)  Discharge planning: Please contact IR APP or on call IR MD prior to patient d/c to ensure appropriate follow up plans are in place. Typically patient will follow up with IR clinic 10-14 days post d/c for repeat imaging/possible drain injection. IR scheduler will contact patient with date/time of appointment. Patient will need to flush drain QD with 5 cc NS, record output QD, dressing changes every 2-3 days or earlier if soiled.   IR will continue to follow - please call with questions or concerns.  Electronically Signed: [place] [place], PA-C 01/19/2021, 1:27 PM    I spent a total of 15 Minutes at the the patient's bedside AND on the patient's hospital floor or unit, greater than 50% of which was counseling/coordinating care for f/u drain.

## 2021-01-19 NOTE — Progress Notes (Signed)
   01/18/21 2357  Assess: MEWS Score  Temp (!) 100.7 F (38.2 C)  BP 136/77  Pulse Rate (!) 119  ECG Heart Rate (!) 120  Resp (!) 28  Level of Consciousness Alert  SpO2 91 %  O2 Device Room Air  Assess: MEWS Score  MEWS Temp 1  MEWS Systolic 0  MEWS Pulse 2  MEWS RR 2  MEWS LOC 0  MEWS Score 5  MEWS Score Color Red  Assess: if the MEWS score is Yellow or Red  Were vital signs taken at a resting state? Yes  Focused Assessment No change from prior assessment  Early Detection of Sepsis Score *See Row Information* Medium  MEWS guidelines implemented *See Row Information* Yes  Treat  Pain Scale 0-10  Pain Score 0  Take Vital Signs  Increase Vital Sign Frequency  Red: Q 1hr X 4 then Q 4hr X 4, if remains red, continue Q 4hrs  Escalate  MEWS: Escalate Red: discuss with charge nurse/RN and provider, consider discussing with RRT  Notify: Charge Nurse/RN  Name of Charge Nurse/RN Notified Nicole,RN  Date Charge Nurse/RN Notified 01/19/21  Time Charge Nurse/RN Notified 0010  Notify: Provider  Provider Name/Title Jennette Kettle  Date Provider Notified 01/19/21  Time Provider Notified 0020  Notification Type Page  Notification Reason Other (Comment) (red mews)  Provider response No new orders  Date of Provider Response 01/19/21  Time of Provider Response 0040  Document  Patient Outcome Stabilized after interventions (receiving scheduled tylenol and IV antibiotics)  Progress note created (see row info) Yes

## 2021-01-19 NOTE — Progress Notes (Signed)
Patient ID: Travis [name], male   DOB: 06-25-1955, 65 y.o.   MRN: [id number] [facility] Surgery Progress Note  13 Days Post-Op  Subjective: CC-    Objective: Vital signs in last 24 hours: Temp:  [97.8 F (36.6 C)-100.7 F (38.2 C)] 97.8 F (36.6 C) (10/27 0801) Pulse Rate:  [96-119] 99 (10/27 0801) Resp:  [16-28] 24 (10/27 0801) BP: (112-145)/(61-78) 127/78 (10/27 0801) SpO2:  [91 %-99 %] 96 % (10/27 0801) Weight:  [94.7 kg] 94.7 kg (10/27 0500) Last BM Date: 01/13/21  Intake/Output from previous day: 10/26 0701 - 10/27 0700 In: 4973.2 [P.O.:560; I.V.:3203.8; Blood:555.8; IV Piggyback:653.6] Out: 2450 [Urine:2350; Drains:100] Intake/Output this shift: Total I/O In: -  Out: 1200 [Urine:1200]  PE: Gen:  Alert, NAD, pleasant Card:  tachy Pulm:  increased work of breathing on supplemental O2 Abd: Soft, mild distension, moderate right sided abdominal TTP, no left sided tenderness and no diffuse peritonitis, vac to midline incision with good seal  Lab Results:  Recent Labs    01/18/21 0359 01/18/21 2000 01/19/21 0226  WBC 14.9*  --  13.0*  HGB 7.0* 8.1* 7.7*  HCT 22.4* 25.4* 24.3*  PLT 562*  --  523*   BMET Recent Labs    01/18/21 0359 01/19/21 0226  NA 139 139  K 4.6 4.2  CL 104 105  CO2 27 27  GLUCOSE 166* 191*  BUN 42* 34*  CREATININE 1.58* 1.46*  CALCIUM 9.0 9.1   PT/INR No results for input(s): LABPROT, INR in the last 72 hours. CMP     Component Value Date/Time   NA 139 01/19/2021 0226   NA 138 06/20/2020 1034   NA 141 04/20/2014 1019   K 4.2 01/19/2021 0226   K 3.7 04/20/2014 1019   CL 105 01/19/2021 0226   CL 106 04/20/2014 1019   CO2 27 01/19/2021 0226   CO2 30 04/20/2014 1019   GLUCOSE 191 (H) 01/19/2021 0226   GLUCOSE 102 (H) 04/20/2014 1019   BUN 34 (H) 01/19/2021 0226   BUN 18 06/20/2020 1034   BUN 14 04/20/2014 1019   CREATININE 1.46 (H) 01/19/2021 0226   CREATININE 1.18 04/20/2014 1019   CALCIUM 9.1 01/19/2021 0226    CALCIUM 9.3 04/20/2014 1019   PROT 6.1 (L) 01/19/2021 0226   PROT 6.5 06/18/2017 1425   PROT 7.4 04/20/2014 1019   ALBUMIN 1.5 (L) 01/19/2021 0226   ALBUMIN 4.0 06/18/2017 1425   ALBUMIN 3.4 04/20/2014 1019   AST 45 (H) 01/19/2021 0226   AST 21 04/20/2014 1019   ALT 42 01/19/2021 0226   ALT 20 04/20/2014 1019   ALKPHOS 226 (H) 01/19/2021 0226   ALKPHOS 97 04/20/2014 1019   BILITOT 1.3 (H) 01/19/2021 0226   BILITOT 0.4 06/18/2017 1425   BILITOT 0.6 04/20/2014 1019   GFRNONAA 53 (L) 01/19/2021 0226   GFRNONAA >60 04/20/2014 1019   GFRNONAA >60 11/19/2012 1107   GFRAA 53 (L) 01/13/2020 1410   GFRAA >60 04/20/2014 1019   GFRAA >60 11/19/2012 1107   Lipase     Component Value Date/Time   LIPASE 31 01/01/2021 1023   LIPASE 112 04/20/2014 1019       Studies/Results: DG Chest 1 View  Result Date: 01/17/2021 CLINICAL DATA:  Status post left thoracentesis. EXAM: CHEST  1 VIEW COMPARISON:  Chest x-ray obtained earlier today FINDINGS: No evidence of pneumothorax. Reduced left pleural effusion. New percutaneous drainage catheter projects over the left upper quadrant. Left upper extremity PICC. Catheter tip terminates  over the right atrium. Very low lung volumes. Pulmonary vascular congestion. IMPRESSION: No evidence of pneumothorax or other complication following left-sided thoracentesis. Electronically Signed   By: Jacqulynn Cadet M.D.   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 Patient now has systemic evidence of infection with a complex fluid collection versus hematoma in the left subdiaphragmatic space with an associated left-sided pleural effusion. He presents for thoracentesis and percutaneous abscess drain placement. EXAM: ULTRASOUND GUIDED ABSCESS DRAINAGE; US THORACENTESIS ASP PLEURAL SPACE  W/IMG GUIDE MEDICATIONS: The patient is currently admitted to the hospital and receiving intravenous antibiotics. The antibiotics were administered within an appropriate time frame prior to the initiation of the procedure. ANESTHESIA/SEDATION: Fentanyl 75 mcg IV; Versed 1.5 mg IV Moderate Sedation Time: 27 minutes The patient was continuously monitored during the procedure by the interventional radiology nurse under my direct supervision. COMPLICATIONS: None immediate. PROCEDURE: Informed written consent was obtained from the patient after a thorough discussion of the procedural risks, benefits and alternatives. All questions were addressed. Maximal Sterile Barrier Technique was utilized including caps, mask, sterile gowns, sterile gloves, sterile drape, hand hygiene and skin antiseptic. A timeout was performed prior to the initiation of the procedure. Attention was first turned to the right-sided pleural effusion. The right thorax was interrogated with ultrasound. A minimally complex pleural effusion is identified. Local anesthesia was attained by infiltration with 1% lidocaine. A small dermatotomy was made. Under real-time ultrasound guidance, a 5 [place] Yueh centesis catheter was advanced in the pleural space. The catheter was then attached to a evacuated bottle and thoracentesis was performed yielding approximately 600 mL serosanguineous fluid. A sample was reserved for labs. The Yueh centesis catheter was removed and a Band-Aid applied. Next, the left upper quadrant subdiaphragmatic collection was assessed sonographically. A highly complex fluid collection is present in the left upper quadrant. A suitable skin entry site was again selected and marked. Local anesthesia was attained by infiltration with 1% lidocaine. A small dermatotomy was made. Under real-time ultrasound guidance, a 17 gauge introducer needle was advanced into the fluid collection. Through this, a 0.035 wire was advanced into the fluid  collection. The introducer needle was removed. The tract was dilated to 34 [place]. A Cook 12 [place] all-purpose drainage catheter was advanced over the wire and into the fluid collection. Aspiration yields thick turbid bloody fluid. Approximately 40 mL was successfully aspirated. The catheter was then connected to JP bulb suction and secured to the skin with 0 Prolene suture and an adhesive fixation device. The patient tolerated the procedure well. IMPRESSION: 1. Successful left thoracentesis yielding 600 mL serosanguineous pleural fluid. 2. Successful placement of a 12 French drainage catheter into the complex fluid collection in the left subdiaphragmatic space with aspiration of thick, turbid bloody fluid consistent with hematoma. A sample was sent for Gram stain and culture. Electronically Signed   By: Jacqulynn Cadet M.D.   On: 01/17/2021 16:32   DG Chest Port 1 View  Result Date: 01/19/2021 CLINICAL DATA:  Shortness of breath. EXAM: PORTABLE CHEST 1 VIEW COMPARISON:  January 17, 2021. FINDINGS: Stable cardiomediastinal silhouette. Left-sided pleural drainage catheter is unchanged in position. Stable small left pleural effusion is noted with associated left basilar atelectasis. Mild right basilar atelectasis is noted as well. Bony thorax is unremarkable. PICC line is unchanged. No pneumothorax is noted. IMPRESSION: Stable left-sided pleural drainage catheter  is noted with associated left pleural effusion and adjacent atelectasis. Stable mild right basilar atelectasis is noted. Electronically Signed   By: Marijo Conception M.D.   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 Patient now has systemic evidence of infection with a complex fluid collection versus hematoma in the left  subdiaphragmatic space with an associated left-sided pleural effusion. He presents for thoracentesis and percutaneous abscess drain placement. EXAM: ULTRASOUND GUIDED ABSCESS DRAINAGE; US THORACENTESIS ASP PLEURAL SPACE W/IMG GUIDE MEDICATIONS: The patient is currently admitted to the hospital and receiving intravenous antibiotics. The antibiotics were administered within an appropriate time frame prior to the initiation of the procedure. ANESTHESIA/SEDATION: Fentanyl 75 mcg IV; Versed 1.5 mg IV Moderate Sedation Time: 27 minutes The patient was continuously monitored during the procedure by the interventional radiology nurse under my direct supervision. COMPLICATIONS: None immediate. PROCEDURE: Informed written consent was obtained from the patient after a thorough discussion of the procedural risks, benefits and alternatives. All questions were addressed. Maximal Sterile Barrier Technique was utilized including caps, mask, sterile gowns, sterile gloves, sterile drape, hand hygiene and skin antiseptic. A timeout was performed prior to the initiation of the procedure. Attention was first turned to the right-sided pleural effusion. The right thorax was interrogated with ultrasound. A minimally complex pleural effusion is identified. Local anesthesia was attained by infiltration with 1% lidocaine. A small dermatotomy was made. Under real-time ultrasound guidance, a 5 [place] Yueh centesis catheter was advanced in the pleural space. The catheter was then attached to a evacuated bottle and thoracentesis was performed yielding approximately 600 mL serosanguineous fluid. A sample was reserved for labs. The Yueh centesis catheter was removed and a Band-Aid applied. Next, the left upper quadrant subdiaphragmatic collection was assessed sonographically. A highly complex fluid collection is present in the left upper quadrant. A suitable skin entry site was again selected and marked. Local anesthesia was attained by  infiltration with 1% lidocaine. A small dermatotomy was made. Under real-time ultrasound guidance, a 17 gauge introducer needle was advanced into the fluid collection. Through this, a 0.035 wire was advanced into the fluid collection. The introducer needle was removed. The tract was dilated to 50 [place]. A Cook 12 [place] all-purpose drainage catheter was advanced over the wire and into the fluid collection. Aspiration yields thick turbid bloody fluid. Approximately 40 mL was successfully aspirated. The catheter was then connected to JP bulb suction and secured to the skin with 0 Prolene suture and an adhesive fixation device. The patient tolerated the procedure well. IMPRESSION: 1. Successful left thoracentesis yielding 600 mL serosanguineous pleural fluid. 2. Successful placement of a 12 French drainage catheter into the complex fluid collection in the left subdiaphragmatic space with aspiration of thick, turbid bloody fluid consistent with hematoma. A sample was sent for Gram stain and culture. Electronically Signed   By: Jacqulynn Cadet M.D.   On: 01/17/2021 16:32    Anti-infectives: Anti-infectives (From admission, onward)    Start     Dose/Rate Route Frequency Ordered Stop   01/19/21 1200  cefTRIAXone (ROCEPHIN) 2 g in sodium chloride 0.9 % 100 mL IVPB        2 g 200 mL/hr over 30 Minutes Intravenous Every 24 hours 01/19/21 1056     01/19/21 1145  metroNIDAZOLE (FLAGYL) tablet 500 mg  500 mg Oral Every 12 hours 01/19/21 1056     01/18/21 0615  vancomycin (VANCOREADY) IVPB 1250 mg/250 mL        1,250 mg 166.7 mL/hr over 90 Minutes Intravenous Every 24 hours 01/18/21 0517     01/17/21 1330  Ampicillin-Sulbactam (UNASYN) 3 g in sodium chloride 0.9 % 100 mL IVPB  Status:  Discontinued        3 g 200 mL/hr over 30 Minutes Intravenous Every 8 hours 01/17/21 1239 01/19/21 1056   01/09/21 1100  piperacillin-tazobactam (ZOSYN) IVPB 3.375 g  Status:  Discontinued        3.375 g 12.5 mL/hr over  240 Minutes Intravenous Every 8 hours 01/09/21 1000 01/12/21 0835   01/09/21 0830  ceFEPIme (MAXIPIME) 2 g in sodium chloride 0.9 % 100 mL IVPB  Status:  Discontinued        2 g 200 mL/hr over 30 Minutes Intravenous Every 12 hours 01/09/21 0737 01/09/21 1000   01/08/21 1400  metroNIDAZOLE (FLAGYL) IVPB 500 mg  Status:  Discontinued        500 mg 100 mL/hr over 60 Minutes Intravenous Every 12 hours 01/08/21 1059 01/09/21 1000   01/08/21 1400  ceFEPIme (MAXIPIME) 2 g in sodium chloride 0.9 % 100 mL IVPB  Status:  Discontinued        2 g 200 mL/hr over 30 Minutes Intravenous Every 24 hours 01/08/21 1114 01/09/21 0737   01/08/21 1000  vancomycin (VANCOREADY) IVPB 750 mg/150 mL  Status:  Discontinued        750 mg 150 mL/hr over 60 Minutes Intravenous Every 24 hours 01/07/21 2052 01/09/21 1000   01/08/21 0600  vancomycin (VANCOREADY) IVPB 1500 mg/300 mL  Status:  Discontinued        1,500 mg 150 mL/hr over 120 Minutes Intravenous Every 24 hours 01/07/21 0430 01/07/21 1210   01/08/21 0600  vancomycin (VANCOREADY) IVPB 750 mg/150 mL  Status:  Discontinued        750 mg 150 mL/hr over 60 Minutes Intravenous Every 24 hours 01/07/21 1210 01/07/21 2052   01/07/21 1400  piperacillin-tazobactam (ZOSYN) IVPB 3.375 g  Status:  Discontinued        3.375 g 12.5 mL/hr over 240 Minutes Intravenous Every 8 hours 01/07/21 0430 01/08/21 1114   01/07/21 0530  piperacillin-tazobactam (ZOSYN) IVPB 3.375 g        3.375 g 100 mL/hr over 30 Minutes Intravenous STAT 01/07/21 0430 01/07/21 0622   01/07/21 0530  vancomycin (VANCOREADY) IVPB 1750 mg/350 mL        1,750 mg 175 mL/hr over 120 Minutes Intravenous  Once 01/07/21 0430 01/07/21 0841   01/05/21 1430  Ampicillin-Sulbactam (UNASYN) 3 g in sodium chloride 0.9 % 100 mL IVPB        3 g 200 mL/hr over 30 Minutes Intravenous Every 6 hours 01/05/21 1334 01/06/21 1552   01/01/21 2200  piperacillin-tazobactam (ZOSYN) IVPB 3.375 g        3.375 g 12.5 mL/hr over 240  Minutes Intravenous Every 8 hours 01/01/21 1841 01/04/21 2359   01/01/21 2100  piperacillin-tazobactam (ZOSYN) IVPB 3.375 g  Status:  Discontinued        3.375 g 100 mL/hr over 30 Minutes Intravenous Every 8 hours 01/01/21 1836 01/01/21 1841   01/01/21 1345  piperacillin-tazobactam (ZOSYN) IVPB 3.375 g        3.375 g 100 mL/hr over 30 Minutes Intravenous  Once 01/01/21 1337 01/01/21 1430  Assessment/Plan POD 18, s/p exploratory laparotomy, ileocecectomy, drainage of intraabdominal abscess 01/01/21 Dr. Grandville Silos for perforated appendicitis with abscess - Drain removed 10/24 - VAC change M/W/F to midline - PT/OT rec SNF at discharge - on a diet and having bowel function but not eating. Continue TPN for now   POD 12, s/p ex lap, splenectomy, application of incisional wound vac 10/14. Dr. Bobbye Morton for splenic laceration - CT 10/24 with complex LUQ fluid collection - s/p IR drain 10/25 yielding dark bloody fluid, culture with E coli resistant to unasyn - switched to Rocephin/ flagyl today 10/27.  Also with staph bacteremia. Vancomycin added 10/26. - WBC continues to trend down and patient afebrile last 24 hours, but he does not look well. He is confused. Continue drain and antibiotics as above for now. Palliative team is following. Discussed with primary team yesterday that if the patient does not start to make improvements over the next couple of days he may transition to comfort care.    FEN: TNA, HH/CM diet VTE: SQH  ID: Zosyn 10/9>> 10/20, unasyn 10/25>>10/27, rocephin/flagyl 10/27>>, vancomycin 10/26>>   Below per primary team: hemorrhagic shock 2/2 above s/p 6 u PRBC, 2 u FFP 10/14 Septic shock 2/2 perforated appendicitis  EtOH abuse Alcohol related cardiomyopathy T2DM Chronic anemia Hx of TIA HTN HLD Gout Hx of GIB CAD Tobacco abuse L pleural effusion s/p thoracentesis 10/25   LOS: 18 days    Wellington [place], [facility] Surgery 01/19/2021, 11:10  AM Please see Amion for pager number during day hours 7:00am-4:30pm

## 2021-01-20 DIAGNOSIS — Z515 Encounter for palliative care: Secondary | ICD-10-CM | POA: Diagnosis not present

## 2021-01-20 LAB — BRAIN NATRIURETIC PEPTIDE: B Natriuretic Peptide: 185.7 pg/mL — ABNORMAL HIGH (ref 0.0–100.0)

## 2021-01-20 LAB — COMPREHENSIVE METABOLIC PANEL
ALT: 42 U/L (ref 0–44)
AST: 41 U/L (ref 15–41)
Albumin: 1.6 g/dL — ABNORMAL LOW (ref 3.5–5.0)
Alkaline Phosphatase: 241 U/L — ABNORMAL HIGH (ref 38–126)
Anion gap: 8 (ref 5–15)
BUN: 28 mg/dL — ABNORMAL HIGH (ref 8–23)
CO2: 27 mmol/L (ref 22–32)
Calcium: 9.2 mg/dL (ref 8.9–10.3)
Chloride: 104 mmol/L (ref 98–111)
Creatinine, Ser: 1.2 mg/dL (ref 0.61–1.24)
GFR, Estimated: >60 mL/min (ref >60)
Glucose, Bld: 124 mg/dL — ABNORMAL HIGH (ref 70–99)
Potassium: 3.9 mmol/L (ref 3.5–5.1)
Sodium: 139 mmol/L (ref 135–145)
Total Bilirubin: 1 mg/dL (ref 0.3–1.2)
Total Protein: 6.3 g/dL — ABNORMAL LOW (ref 6.5–8.1)

## 2021-01-20 LAB — CBC
HCT: 25 % — ABNORMAL LOW (ref 39.0–52.0)
Hemoglobin: 8 g/dL — ABNORMAL LOW (ref 13.0–17.0)
MCH: 29.4 pg (ref 26.0–34.0)
MCHC: 32 g/dL (ref 30.0–36.0)
MCV: 91.9 fL (ref 80.0–100.0)
Platelets: 530 10*3/uL — ABNORMAL HIGH (ref 150–400)
RBC: 2.72 MIL/uL — ABNORMAL LOW (ref 4.22–5.81)
RDW: 17.5 % — ABNORMAL HIGH (ref 11.5–15.5)
WBC: 12.8 10*3/uL — ABNORMAL HIGH (ref 4.0–10.5)
nRBC: 0.5 % — ABNORMAL HIGH (ref 0.0–0.2)

## 2021-01-20 LAB — PROCALCITONIN: Procalcitonin: 0.34 ng/mL

## 2021-01-20 LAB — GLUCOSE, CAPILLARY
Glucose-Capillary: 115 mg/dL — ABNORMAL HIGH (ref 70–99)
Glucose-Capillary: 120 mg/dL — ABNORMAL HIGH (ref 70–99)
Glucose-Capillary: 137 mg/dL — ABNORMAL HIGH (ref 70–99)
Glucose-Capillary: 147 mg/dL — ABNORMAL HIGH (ref 70–99)
Glucose-Capillary: 154 mg/dL — ABNORMAL HIGH (ref 70–99)

## 2021-01-20 MED ORDER — HYDROMORPHONE HCL 1 MG/ML IJ SOLN
0.5000 mg | Freq: Four times a day (QID) | INTRAMUSCULAR | Status: DC | PRN
  Administered 2021-01-22 – 2021-01-29 (×8): 0.5 mg via INTRAVENOUS
  Filled 2021-01-20 (×8): qty 0.5

## 2021-01-20 MED ORDER — SALINE SPRAY 0.65 % NA SOLN
1.0000 | NASAL | Status: DC | PRN
  Administered 2021-01-20: 1 via NASAL
  Filled 2021-01-20: qty 44

## 2021-01-20 MED ORDER — MENINGOCOCCAL A C Y&W-135 OLIG IM SOLR
0.5000 mL | Freq: Once | INTRAMUSCULAR | Status: AC
  Administered 2021-01-20: 0.5 mL via INTRAMUSCULAR
  Filled 2021-01-20: qty 0.5

## 2021-01-20 MED ORDER — INSULIN ASPART 100 UNIT/ML IJ SOLN
0.0000 [IU] | Freq: Four times a day (QID) | INTRAMUSCULAR | Status: DC
  Administered 2021-01-20 – 2021-01-22 (×9): 4 [IU] via SUBCUTANEOUS
  Administered 2021-01-23 (×2): 3 [IU] via SUBCUTANEOUS
  Administered 2021-01-24: 1 [IU] via SUBCUTANEOUS
  Administered 2021-01-24: 3 [IU] via SUBCUTANEOUS
  Administered 2021-01-25: 4 [IU] via SUBCUTANEOUS
  Administered 2021-01-25 – 2021-01-26 (×2): 3 [IU] via SUBCUTANEOUS
  Administered 2021-01-27 – 2021-01-28 (×2): 4 [IU] via SUBCUTANEOUS
  Administered 2021-01-29: 2 [IU] via SUBCUTANEOUS
  Administered 2021-01-30 (×2): 3 [IU] via SUBCUTANEOUS
  Administered 2021-01-30: 4 [IU] via SUBCUTANEOUS

## 2021-01-20 MED ORDER — PNEUMOCOCCAL 13-VAL CONJ VACC IM SUSP
0.5000 mL | Freq: Once | INTRAMUSCULAR | Status: AC
  Administered 2021-01-20: 0.5 mL via INTRAMUSCULAR
  Filled 2021-01-20: qty 0.5

## 2021-01-20 MED ORDER — OXYCODONE HCL 5 MG PO TABS
5.0000 mg | ORAL_TABLET | Freq: Four times a day (QID) | ORAL | Status: DC | PRN
  Administered 2021-01-21 – 2021-02-06 (×13): 5 mg via ORAL
  Filled 2021-01-20 (×13): qty 1

## 2021-01-20 MED ORDER — TRACE MINERALS CU-MN-SE-ZN 300-55-60-3000 MCG/ML IV SOLN
INTRAVENOUS | Status: DC
Start: 2021-01-20 — End: 2021-01-21
  Filled 2021-01-20: qty 1296

## 2021-01-20 MED ORDER — HAEMOPHILUS B POLYSAC CONJ VAC IM SOLR
0.5000 mL | Freq: Once | INTRAMUSCULAR | Status: AC
  Administered 2021-01-20: 0.5 mL via INTRAMUSCULAR
  Filled 2021-01-20: qty 0.5

## 2021-01-20 NOTE — Progress Notes (Addendum)
Physical Therapy Treatment Patient Details Name: Travis [name] MRN: [id number] DOB: 1956-02-06 Today's Date: 01/20/2021   History of Present Illness Pt is a 65 y/o male admitted 10/9 with complaints of R lower quadrant abdominal pain. Found with intra-abdominal sepsis with perforated appendix and abdominal abscess; s/p diagnostic laparoscopy, ileocecectomy, drainage of interaabdominal abscess 10/9. Post op ileus. 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 Pt self extubated 10/16. L thoracentesis 10/25 with 600cc drained. PMHx: CHF, CKD, COPD, TIA, diabetes, HTN, gout, CAD.    PT Comments    Focused on LE strengthening with pt able to participate with all exercises. Deferred further mobility due to one person assist.    Recommendations for follow up therapy are one component of a multi-disciplinary discharge planning process, led by the attending physician.  Recommendations may be updated based on patient status, additional functional criteria and insurance authorization.  Follow Up Recommendations  PT at Long-term acute care hospital     Assistance Recommended at Discharge Frequent or constant Supervision/Assistance  Equipment Recommendations  Rolling walker (2 wheels);Wheelchair cushion (measurements PT);Wheelchair (measurements PT)    Recommendations for Other Services       Precautions / Restrictions Precautions Precautions: Fall Precaution Comments: wound vac and L drain in abdomen     Mobility  Bed Mobility                    Transfers                        Ambulation/Gait                 Stairs             Wheelchair Mobility    Modified Rankin (Stroke Patients Only)       Balance                                            Cognition Arousal/Alertness: Awake/alert Behavior During Therapy: Flat affect Overall Cognitive Status: No family/caregiver present to determine baseline cognitive  functioning Area of Impairment: Orientation;Attention;Following commands;Problem solving                 Orientation Level: Disoriented to;Place;Time;Situation Current Attention Level: Sustained   Following Commands: Follows one step commands consistently;Follows one step commands with increased time     Problem Solving: Slow processing;Decreased initiation;Requires verbal cues;Requires tactile cues          Exercises General Exercises - Lower Extremity Ankle Circles/Pumps: AAROM;Both;10 reps;Supine Short Arc Quad: AROM;Both;10 reps;Supine Heel Slides: AAROM;Both;10 reps;Supine Hip ABduction/ADduction: AAROM;Both;10 reps;Supine Straight Leg Raises: AAROM;Both;10 reps;Supine    General Comments        Pertinent Vitals/Pain Pain Assessment: Faces Faces Pain Scale: Hurts a little bit Pain Location: stomach Pain Descriptors / Indicators: Grimacing Pain Intervention(s): Limited activity within patient's tolerance;Repositioned    Home Living                          Prior Function            PT Goals (current goals can now be found in the care plan section) Progress towards PT goals: Progressing toward goals    Frequency    Min 2X/week      PT Plan Discharge plan needs to be updated;Frequency needs to be updated  Co-evaluation              AM-PAC PT "6 Clicks" Mobility   Outcome Measure  Help needed turning from your back to your side while in a flat bed without using bedrails?: A Lot Help needed moving from lying on your back to sitting on the side of a flat bed without using bedrails?: Total Help needed moving to and from a bed to a chair (including a wheelchair)?: Total Help needed standing up from a chair using your arms (e.g., wheelchair or bedside chair)?: Total Help needed to walk in hospital room?: Total Help needed climbing 3-5 steps with a railing? : Total 6 Click Score: 7    End of Session Equipment Utilized During  Treatment: Oxygen Activity Tolerance: Patient tolerated treatment well Patient left: in bed;with call bell/phone within reach;with bed alarm set   PT Visit Diagnosis: Difficulty in walking, not elsewhere classified (R26.2);Other abnormalities of gait and mobility (R26.89);Muscle weakness (generalized) (M62.81);Pain Pain - part of body:  (abdomen)     Time: 1749-4496 PT Time Calculation (min) (ACUTE ONLY): 11 min  Charges:  $Therapeutic Exercise: 8-22 mins                     [place] Pager [phone] Office [place] 01/20/2021, 4:25 PM

## 2021-01-20 NOTE — Progress Notes (Signed)
Occupational Therapy Treatment Patient Details Name: Travis [name] MRN: [id number] DOB: 04/20/1955 Today's Date: 01/20/2021   History of present illness Pt is a 65 y/o male admitted 10/9 with complaints of R lower quadrant abdominal pain. Found with intra-abdominal sepsis with perforated appendix and abdominal abscess; s/p diagnostic laparoscopy, ileocecectomy, drainage of interaabdominal abscess 10/9. Post op ileus. 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 Pt self extubated 10/16. L thoracentesis 10/25 with 600cc drained. PMHx: CHF, CKD, COPD, TIA, diabetes, HTN, gout, CAD.   OT comments  Patient received in bed and OT treatment performed at bed level.  Patient had difficulty answering orientation question and required demonstration to load toothbrush but was able to perform brushing teeth and wash hands and face.  Patient led in AROM exercises at bed level.  Patient asked often how long he has been in hospital during visit. Acute OT to continue to follow.     Recommendations for follow up therapy are one component of a multi-disciplinary discharge planning process, led by the attending physician.  Recommendations may be updated based on patient status, additional functional criteria and insurance authorization.    Follow Up Recommendations  Skilled nursing-short term rehab (<3 hours/day)    Assistance Recommended at Discharge Frequent or constant Supervision/Assistance  Equipment Recommendations  Other (comment)    Recommendations for Other Services      Precautions / Restrictions Precautions Precautions: Fall Precaution Comments: wound vac and L drain in abdomen       Mobility Bed Mobility                    Transfers                         Balance                                           ADL either performed or assessed with clinical judgement   ADL Overall ADL's : Needs assistance/impaired     Grooming: Wash/dry  hands;Wash/dry face;Oral care;Minimal assistance;Cueing for sequencing;Bed level Grooming Details (indicate cue type and reason): difficutly following directions for loading toothbrush                               General ADL Comments: rquired frequent cues to stay on task and sequencing     Vision       Perception     Praxis      Cognition Arousal/Alertness: Awake/alert Behavior During Therapy: Flat affect Overall Cognitive Status: No family/caregiver present to determine baseline cognitive functioning Area of Impairment: Attention;Following commands;Safety/judgement;Memory                 Orientation Level: Disoriented to;Place;Time;Situation Current Attention Level: Sustained Memory: Decreased recall of precautions Following Commands: Follows one step commands with increased time;Follows one step commands inconsistently Safety/Judgement: Decreased awareness of deficits;Decreased awareness of safety Awareness: Emergent Problem Solving: Slow processing;Decreased initiation;Requires verbal cues;Requires tactile cues General Comments: asked frequently how often he has been in hospital and if his family and friends were aware          Exercises Exercises: General Upper Extremity General Exercises - Upper Extremity Shoulder Flexion: AROM;Both;10 reps;Supine Shoulder ABduction: AROM;Both;10 reps;Supine Elbow Flexion: AROM;Both;10 reps;Supine Elbow Extension: AROM;Both;10 reps;Supine   Shoulder Instructions  General Comments      Pertinent Vitals/ Pain       Pain Assessment: Faces Faces Pain Scale: Hurts little more Breathing: normal Negative Vocalization: none Facial Expression: smiling or inexpressive Body Language: relaxed Consolability: no need to console PAINAD Score: 0 Facial Expression: Relaxed, neutral Body Movements: Absence of movements Muscle Tension: Relaxed Pain Location: stomach Pain Descriptors / Indicators:  Grimacing Pain Intervention(s): Monitored during session  Home Living                                          Prior Functioning/Environment              Frequency  Min 2X/week        Progress Toward Goals  OT Goals(current goals can now be found in the care plan section)  Progress towards OT goals: Not progressing toward goals - comment  Acute Rehab OT Goals OT Goal Formulation: Patient unable to participate in goal setting Time For Goal Achievement: 01/25/21 Potential to Achieve Goals: Good ADL Goals Pt Will Perform Grooming: with set-up;sitting Pt Will Perform Lower Body Dressing: with adaptive equipment;sitting/lateral leans;with mod assist Pt Will Transfer to Toilet: with mod assist;with +2 assist;bedside commode;stand pivot transfer Pt Will Perform Toileting - Clothing Manipulation and hygiene: with mod assist;sitting/lateral leans Additional ADL Goal #1: Pt will complete bed mobiltiy with min assist as precursor to ADLS.  Plan Discharge plan remains appropriate    Co-evaluation                 AM-PAC OT "6 Clicks" Daily Activity     Outcome Measure   Help from another person eating meals?: Total Help from another person taking care of personal grooming?: A Little Help from another person toileting, which includes using toliet, bedpan, or urinal?: Total Help from another person bathing (including washing, rinsing, drying)?: A Lot Help from another person to put on and taking off regular upper body clothing?: A Lot Help from another person to put on and taking off regular lower body clothing?: Total 6 Click Score: 10    End of Session Equipment Utilized During Treatment: Oxygen  OT Visit Diagnosis: Other abnormalities of gait and mobility (R26.89);Muscle weakness (generalized) (M62.81);Pain;Other symptoms and signs involving cognitive function   Activity Tolerance Patient limited by fatigue   Patient Left in bed;with call  bell/phone within reach;with bed alarm set   Nurse Communication Other (comment) (discussed increased confusion)        Time: 6010-9323 OT Time Calculation (min): 18 min  Charges: OT General Charges $OT Visit: 1 Visit OT Treatments $Self Care/Home Management : 8-22 mins  Lodema Hong, [place]  Pager [phone] Office [place] 01/20/2021, 12:47 PM

## 2021-01-20 NOTE — Progress Notes (Signed)
   01/19/21 1956  Assess: MEWS Score  Temp 98.2 F (36.8 C)  BP (!) 152/84  Pulse Rate (!) 115  ECG Heart Rate (!) 111  Resp 20  Level of Consciousness Alert  SpO2 100 %  O2 Device Nasal Cannula  O2 Flow Rate (L/min) 2 L/min  Assess: MEWS Score  MEWS Temp 0  MEWS Systolic 0  MEWS Pulse 2  MEWS RR 0  MEWS LOC 0  MEWS Score 2  MEWS Score Color Yellow  Assess: if the MEWS score is Yellow or Red  Were vital signs taken at a resting state? Yes  Focused Assessment No change from prior assessment  Early Detection of Sepsis Score *See Row Information* Medium  MEWS guidelines implemented *See Row Information* Yes  Treat  Pain Scale 0-10  Pain Score Asleep  Take Vital Signs  Increase Vital Sign Frequency  Yellow: Q 2hr X 2 then Q 4hr X 2, if remains yellow, continue Q 4hrs  Escalate  MEWS: Escalate Yellow: discuss with charge nurse/RN and consider discussing with provider and RRT  Notify: Charge Nurse/RN  Name of Charge Nurse/RN Notified [place]  Date Charge Nurse/RN Notified 01/19/21  Time Charge Nurse/RN Notified 1956  Document  Patient Outcome Other (Comment) (BASELINE AND STABLE)  Progress note created (see row info) Yes

## 2021-01-20 NOTE — Consult Note (Signed)
[place] Nurse wound follow up Patient receiving care in [facility] [address] Wound type: Midline abdominal surgical incision Pressure Injury POA: NA  Wound bed: pink granulation tissue. Friable. Bleeding when dressing removed. Still very painful  Drainage (amount, consistency, odor) Serosanguinous in canister Periwound: Intact Dressing procedure/placement/frequency: 2 pieces of black foam removed. 2 pieces of black foam used to fill the wound bed which extends down to the pubic hair line. Medium size Kellie Simmering # [phone]) Drape applied and immediate suction obtained at 125 mmHg. Canister Kellie Simmering # [phone]). Use NS to loosen foam and premedicate prior to dressing change. Very painful for the patient.  M/W/F vac change. WOC will follow.   1 dressing kit at bedside. No more ordered until POC is decided, possible comfort care.   Cathlean Marseilles [name], MSN, RN, [place], Lysle Pearl, [facility] Wound Treatment Associate Pager [phone]

## 2021-01-20 NOTE — Progress Notes (Signed)
Progress Note  14 Days Post-Op  Subjective: Patient reports abdominal pain but unable to really characterize. He reports he is not eating much. Unsure of when last BM was.   Objective: Vital signs in last 24 hours: Temp:  [97.8 F (36.6 C)-99.5 F (37.5 C)] 98.6 F (37 C) (10/28 0744) Pulse Rate:  [88-115] 104 (10/28 0744) Resp:  [16-22] 20 (10/28 0744) BP: (110-152)/(68-84) 127/77 (10/28 0744) SpO2:  [90 %-100 %] 94 % (10/28 0744) Weight:  [90.4 kg] 90.4 kg (10/28 0500) Last BM Date: 01/13/21  Intake/Output from previous day: 10/27 0701 - 10/28 0700 In: 1209.7 [P.O.:240; I.V.:864.7; IV Piggyback:100] Out: 2250 [Urine:2200; Drains:50] Intake/Output this shift: Total I/O In: -  Out: 250 [Urine:250]  PE: Gen:  Alert, NAD, pleasant Card:  tachy Pulm:  increased work of breathing on supplemental O2 Abd: Soft, mild distension, moderate right sided abdominal TTP, no left sided tenderness and no diffuse peritonitis, vac to midline incision with good seal, LUQ drain with dark bloody fluid   Lab Results:  Recent Labs    01/19/21 0226 01/20/21 0453  WBC 13.0* 12.8*  HGB 7.7* 8.0*  HCT 24.3* 25.0*  PLT 523* 530*   BMET Recent Labs    01/19/21 0226 01/20/21 0453  NA 139 139  K 4.2 3.9  CL 105 104  CO2 27 27  GLUCOSE 191* 124*  BUN 34* 28*  CREATININE 1.46* 1.20  CALCIUM 9.1 9.2   PT/INR No results for input(s): LABPROT, INR in the last 72 hours. CMP     Component Value Date/Time   NA 139 01/20/2021 0453   NA 138 06/20/2020 1034   NA 141 04/20/2014 1019   K 3.9 01/20/2021 0453   K 3.7 04/20/2014 1019   CL 104 01/20/2021 0453   CL 106 04/20/2014 1019   CO2 27 01/20/2021 0453   CO2 30 04/20/2014 1019   GLUCOSE 124 (H) 01/20/2021 0453   GLUCOSE 102 (H) 04/20/2014 1019   BUN 28 (H) 01/20/2021 0453   BUN 18 06/20/2020 1034   BUN 14 04/20/2014 1019   CREATININE 1.20 01/20/2021 0453   CREATININE 1.18 04/20/2014 1019   CALCIUM 9.2 01/20/2021 0453    CALCIUM 9.3 04/20/2014 1019   PROT 6.3 (L) 01/20/2021 0453   PROT 6.5 06/18/2017 1425   PROT 7.4 04/20/2014 1019   ALBUMIN 1.6 (L) 01/20/2021 0453   ALBUMIN 4.0 06/18/2017 1425   ALBUMIN 3.4 04/20/2014 1019   AST 41 01/20/2021 0453   AST 21 04/20/2014 1019   ALT 42 01/20/2021 0453   ALT 20 04/20/2014 1019   ALKPHOS 241 (H) 01/20/2021 0453   ALKPHOS 97 04/20/2014 1019   BILITOT 1.0 01/20/2021 0453   BILITOT 0.4 06/18/2017 1425   BILITOT 0.6 04/20/2014 1019   GFRNONAA >60 01/20/2021 0453   GFRNONAA >60 04/20/2014 1019   GFRNONAA >60 11/19/2012 1107   GFRAA 53 (L) 01/13/2020 1410   GFRAA >60 04/20/2014 1019   GFRAA >60 11/19/2012 1107   Lipase     Component Value Date/Time   LIPASE 31 01/01/2021 1023   LIPASE 112 04/20/2014 1019       Studies/Results: CT ABDOMEN PELVIS WO CONTRAST  Result Date: 01/19/2021 CLINICAL DATA:  Abdominal pain, history of ruptured appendicitis EXAM: CT ABDOMEN AND PELVIS WITHOUT CONTRAST TECHNIQUE: Multidetector CT imaging of the abdomen and pelvis was performed following the standard protocol without IV contrast. COMPARISON:  01/16/2021 FINDINGS: Lower chest: No acute abnormality. Hepatobiliary: No solid liver abnormality is seen.  Small gallstone near the gallbladder neck (series 3, image 39). Gallbladder wall thickening, or biliary dilatation. Pancreas: Unremarkable. No pancreatic ductal dilatation or surrounding inflammatory changes. Spleen: Status post splenectomy. Interval placement of percutaneous pigtail drain catheters in the left upper quadrant within an air and fluid collection, the dominant component in which the catheter is placed measuring 7.9 x 6.6 cm, previously 8.5 x 6.2 cm (series 3, image 21). Adrenals/Urinary Tract: Adrenal glands are unremarkable. Kidneys are normal, without renal calculi, solid lesion, or hydronephrosis. Bladder is unremarkable. Stomach/Bowel: Stomach is within normal limits. Enteric contrast present in the colon.  Redemonstrated postoperative findings status post appendectomy. There has been interval removal of a surgical drain in the right lower quadrant. Previously seen small, rim enhancing fluid collection in the right lower quadrant is not significantly changed, measuring 1.5 cm (series 3, image 65). Vascular/Lymphatic: Aortic atherosclerosis. No enlarged abdominal or pelvic lymph nodes. Reproductive: No mass or other significant abnormality. Other: No abdominal wall hernia or abnormality. No abdominopelvic ascites. Musculoskeletal: No acute or significant osseous findings. Status post right hip total arthroplasty. IMPRESSION: 1. Interval placement of percutaneous pigtail drain catheter in the left upper quadrant within an air and fluid collection, the dominant component in which the catheter is placed measuring 7.9 x 6.6 cm, previously 8.5 x 6.2 cm. 2. Previously seen small, rim enhancing fluid collection in the right lower quadrant is not significantly changed, measuring 1.5 cm. 3. Redemonstrated postoperative findings status post appendectomy and splenectomy. There has been interval removal of a surgical drain in the right lower quadrant. 4. Cholelithiasis. Aortic Atherosclerosis (ICD10-I70.0). Electronically Signed   By: Delanna Ahmadi M.D.   On: 01/19/2021 12:32   DG Chest Port 1 View  Result Date: 01/19/2021 CLINICAL DATA:  Shortness of breath. EXAM: PORTABLE CHEST 1 VIEW COMPARISON:  January 17, 2021. FINDINGS: Stable cardiomediastinal silhouette. Left-sided pleural drainage catheter is unchanged in position. Stable small left pleural effusion is noted with associated left basilar atelectasis. Mild right basilar atelectasis is noted as well. Bony thorax is unremarkable. PICC line is unchanged. No pneumothorax is noted. IMPRESSION: Stable left-sided pleural drainage catheter is noted with associated left pleural effusion and adjacent atelectasis. Stable mild right basilar atelectasis is noted. Electronically  Signed   By: Marijo Conception M.D.   On: 01/19/2021 08:01    Anti-infectives: Anti-infectives (From admission, onward)    Start     Dose/Rate Route Frequency Ordered Stop   01/19/21 1200  cefTRIAXone (ROCEPHIN) 2 g in sodium chloride 0.9 % 100 mL IVPB        2 g 200 mL/hr over 30 Minutes Intravenous Every 24 hours 01/19/21 1056     01/19/21 1145  metroNIDAZOLE (FLAGYL) tablet 500 mg        500 mg Oral Every 12 hours 01/19/21 1056     01/18/21 0615  vancomycin (VANCOREADY) IVPB 1250 mg/250 mL  Status:  Discontinued        1,250 mg 166.7 mL/hr over 90 Minutes Intravenous Every 24 hours 01/18/21 0517 01/19/21 1400   01/17/21 1330  Ampicillin-Sulbactam (UNASYN) 3 g in sodium chloride 0.9 % 100 mL IVPB  Status:  Discontinued        3 g 200 mL/hr over 30 Minutes Intravenous Every 8 hours 01/17/21 1239 01/19/21 1056   01/09/21 1100  piperacillin-tazobactam (ZOSYN) IVPB 3.375 g  Status:  Discontinued        3.375 g 12.5 mL/hr over 240 Minutes Intravenous Every 8 hours 01/09/21 1000 01/12/21 0835  01/09/21 0830  ceFEPIme (MAXIPIME) 2 g in sodium chloride 0.9 % 100 mL IVPB  Status:  Discontinued        2 g 200 mL/hr over 30 Minutes Intravenous Every 12 hours 01/09/21 0737 01/09/21 1000   01/08/21 1400  metroNIDAZOLE (FLAGYL) IVPB 500 mg  Status:  Discontinued        500 mg 100 mL/hr over 60 Minutes Intravenous Every 12 hours 01/08/21 1059 01/09/21 1000   01/08/21 1400  ceFEPIme (MAXIPIME) 2 g in sodium chloride 0.9 % 100 mL IVPB  Status:  Discontinued        2 g 200 mL/hr over 30 Minutes Intravenous Every 24 hours 01/08/21 1114 01/09/21 0737   01/08/21 1000  vancomycin (VANCOREADY) IVPB 750 mg/150 mL  Status:  Discontinued        750 mg 150 mL/hr over 60 Minutes Intravenous Every 24 hours 01/07/21 2052 01/09/21 1000   01/08/21 0600  vancomycin (VANCOREADY) IVPB 1500 mg/300 mL  Status:  Discontinued        1,500 mg 150 mL/hr over 120 Minutes Intravenous Every 24 hours 01/07/21 0430 01/07/21  1210   01/08/21 0600  vancomycin (VANCOREADY) IVPB 750 mg/150 mL  Status:  Discontinued        750 mg 150 mL/hr over 60 Minutes Intravenous Every 24 hours 01/07/21 1210 01/07/21 2052   01/07/21 1400  piperacillin-tazobactam (ZOSYN) IVPB 3.375 g  Status:  Discontinued        3.375 g 12.5 mL/hr over 240 Minutes Intravenous Every 8 hours 01/07/21 0430 01/08/21 1114   01/07/21 0530  piperacillin-tazobactam (ZOSYN) IVPB 3.375 g        3.375 g 100 mL/hr over 30 Minutes Intravenous STAT 01/07/21 0430 01/07/21 0622   01/07/21 0530  vancomycin (VANCOREADY) IVPB 1750 mg/350 mL        1,750 mg 175 mL/hr over 120 Minutes Intravenous  Once 01/07/21 0430 01/07/21 0841   01/05/21 1430  Ampicillin-Sulbactam (UNASYN) 3 g in sodium chloride 0.9 % 100 mL IVPB        3 g 200 mL/hr over 30 Minutes Intravenous Every 6 hours 01/05/21 1334 01/06/21 1552   01/01/21 2200  piperacillin-tazobactam (ZOSYN) IVPB 3.375 g        3.375 g 12.5 mL/hr over 240 Minutes Intravenous Every 8 hours 01/01/21 1841 01/04/21 2359   01/01/21 2100  piperacillin-tazobactam (ZOSYN) IVPB 3.375 g  Status:  Discontinued        3.375 g 100 mL/hr over 30 Minutes Intravenous Every 8 hours 01/01/21 1836 01/01/21 1841   01/01/21 1345  piperacillin-tazobactam (ZOSYN) IVPB 3.375 g        3.375 g 100 mL/hr over 30 Minutes Intravenous  Once 01/01/21 1337 01/01/21 1430        Assessment/Plan POD 19, s/p exploratory laparotomy, ileocecectomy, drainage of intraabdominal abscess 01/01/21 Dr. Grandville Silos for perforated appendicitis with abscess - Drain removed 10/24 - VAC change M/W/F to midline - PT/OT rec SNF at discharge - on a diet and having bowel function but not eating. Continue TPN for now   POD 13, s/p ex lap, splenectomy, application of incisional wound vac 10/14. Dr. Bobbye Morton for splenic laceration - CT 10/24 with complex LUQ fluid collection - s/p IR drain 10/25 yielding dark bloody fluid, culture with E coli resistant to unasyn -  switched to Rocephin/ flagyl today 10/27.  Also with staph bacteremia. Vancomycin added 10/26. - post-splenectomy vaccines to be given today - WBC continues to trend down and patient afebrile  last 24 hours, but he does not look well. He is confused. Continue drain and antibiotics as above for now. Palliative team is following. Discussed with primary team yesterday that if the patient does not start to make improvements over the next couple of days he may transition to comfort care.    FEN: TNA, HH/CM diet VTE: SQH  ID: Zosyn 10/9>> 10/20, unasyn 10/25>>10/27, rocephin/flagyl 10/27>>, vancomycin 10/26>>   Below per primary team: hemorrhagic shock 2/2 above s/p 6 u PRBC, 2 u FFP 10/14 Septic shock 2/2 perforated appendicitis  EtOH abuse Alcohol related cardiomyopathy T2DM Chronic anemia Hx of TIA HTN HLD Gout Hx of GIB CAD Tobacco abuse L pleural effusion s/p thoracentesis 10/25  LOS: 19 days    Norm Parcel, [facility] Surgery 01/20/2021, 10:12 AM Please see Amion for pager number during day hours 7:00am-4:30pm

## 2021-01-20 NOTE — Progress Notes (Signed)
Nutrition Follow-up  DOCUMENTATION CODES:   Non-severe (moderate) malnutrition in context of chronic illness  INTERVENTION:   Continue Ensure Enlive po TID, each supplement provides 350 kcal and 20 grams of protein  Continue Magic cup TID with meals, each supplement provides 290 kcal and 9 grams of protein  Recommend liberalizing pt's diet to regular due to poor PO intake and prolonged need of TPN. Received ok from MD to change Encourage good PO intake  TPN management per Pharmacy  NUTRITION DIAGNOSIS:   Moderate Malnutrition related to acute illness, chronic illness (perforated appendix, CAD, cardiomyopathy) as evidenced by mild muscle depletion, mild fat depletion. - Ongoing   GOAL:   Patient will meet greater than or equal to 90% of their needs - Progressing   MONITOR:   PO intake, Supplement acceptance, I & O's, Skin  REASON FOR ASSESSMENT:   Consult Assessment of nutrition requirement/status  ASSESSMENT:   65 yo male admitted with perforated appendix and abdominal abscess. S/P ex lap, ileocecectomy, drainage of intra-abdominal abscess. PMH includes CHF, CKD 3A, COPD, TIA, DM, HTN, gout, HLD, CAD, alcohol abuse, tobacco abuse.  10/9- s/p ex lap, ileocectomy, drainage of intra-abdominal abscess 10/11- s/p ex lap, splenectomy, and incisional wound vac placement 10/16- extubated, TPN initiated 10/20- advanced to clear liquid diet, NGT d/c 10/23- advanced to full liquid diet  10/25- advanced to Heart Health/Carb Modified  Per EMR, pt has poor intake. Pt states that he is drinking his Ensure's. PLDN observed pt lunch tray in room, 0% of his tray was consumed. Pt stated that he doesn't like it.   Per Pharmacy's note, TPN was decreased to 80% between 10/23 and 10/27. Pt receiving TPN, meeting 100% of his estimated needs.   PLDN showed pt menu and the number to call and place meal orders. Pt kept asking for numbers to call family members. Pt asked if he could call the  number on the menu to get his families number. Informed Nurse Tech of pt's questions as she was going in when I came out.  Palliative care was consulted on 10/26. Possible transition to comfort care. Family on board with care.  Medications reviewed and include: Colace, SSI 0-20 units q6h, Metronidazole, Oxycodone, Protonix, Miralax, IV antibiotics Labs reviewed: BUN 28, 24 hr BG trends 115-147  Admission Weight: 86.8 kg Current Weight: 90.4 kg   Diet Order:   Diet Order             Diet heart healthy/carb modified Room service appropriate? Yes; Fluid consistency: Thin  Diet effective now                   EDUCATION NEEDS:   No education needs have been identified at this time  Skin:  Skin Assessment: Skin Integrity Issues: Skin Integrity Issues:: Wound VAC Wound Vac: abdomen  Last BM:  01/13/2021  Height:   Ht Readings from Last 1 Encounters:  01/01/21 6' (1.829 m)    Weight:   Wt Readings from Last 1 Encounters:  01/20/21 90.4 kg    Ideal Body Weight:  80.9 kg  BMI:  Body mass index is 27.03 kg/m.  Estimated Nutritional Needs:   Kcal:  2400-2600  Protein:  125-150 grams  Fluid:  > 2 L    Saryn Cherry BS, PLDN Clinical Dietitian See AMiON for contact information.

## 2021-01-20 NOTE — Progress Notes (Signed)
PHARMACY - TOTAL PARENTERAL NUTRITION CONSULT NOTE   Indication: Prolonged ileus  Patient Measurements: Height: 6' (182.9 cm) Weight: 90.4 kg (199 lb 4.7 oz) IBW/kg (Calculated) : 77.6   Body mass index is 27.03 kg/m.  Assessment: 65 YO male who presented 10/09 with acute perforated appendix and abdominal abscess s/p OR for ex-lap, ilececectomy, and drainage of abscess on 10/09. Also had splenic laceration, s/p ex-lap, splenectomy, and wound vac application 73/22. Transferred to ICU on 10/12 with septic vs. hemorrhagic shock, intubated and on vasopressor support. Plan was initially to transition to comfort measures 10/14; however improved overnight with decreasing vasopressor requirement. Post-op ileus. Pharmacy consulted to start TPN.   Palliative care was consulted 10/26 due to ongoing declining state. TPN decreased to 80% 10/23-10/27 to help with appetite stimulation but patient still not eating much. Possible discharge to [facility].    Glucose / Insulin: hx DM2 (no meds PTA). BG <180, used 17 units of SSI + 90 units of regular insulin in TPN bag Electrolytes: phos 4.7, CoCa 11.1, other wnl Renal: Scr 1.2, BUN 28 both down  Hepatic: LFTs wnl, albumin 1.6, TG 139  Intake / Output; MIVF: UOP 1 ml/kg/hr, drain 50 ml. NGT removed 10/20, last charted BM 10/21  GI Imaging:  10/09>> CT abdomen: Acute appendicitis with perforation 10/14>> CT abdomen: Splenic rupture with large splenic hematoma 10/20>> CT abdomen: Indeterminate 2.4 cm fluid collection in the right lower quadrant adjacent to the cecal resection site. 10/24 >> CT abdomen: Splenectomy with organizing complex fluid collections in the left upper quadrant. The largest collection there is 162 mL. Stable small discontiguous rim enhancing fluid collections in the lower small bowel mesentery at the pelvic inlet a short distance from the percutaneous drain. These have not significantly changed and collectively are less than 10 mL 10/27 CT Abd:  drain in, same fluid collection GI Surgeries / Procedures:  10/25 thoracentesis 657ml and drain placement  Central access: CVC triple lumen 10/14 >> 10/21; PICC 10/21 >> TPN start date: 01/08/21  Nutritional Goals: Goal TPN rate is 100 mL/hr (provides 130 g of protein and 2422 kcals per day)  RD Assessment: Estimated Needs Total Energy Estimated Needs: 2400-2600 Total Protein Estimated Needs: 125-150 grams Total Fluid Estimated Needs: > 2 L  Current Nutrition:  Full diet (not eating much) and TPN  Plan:  Increase TPN to goal 100 ml/hr to provide 2422 kcal and 130 g protein meeting 100 % of estimated needs   Electrolytes in TPN: increase Na 50 meq/L, K 30 mEq/L, Mg 3 mEq/L, Ca 0 mEq/L, decrease Phos 5 mmol/L, Cl:Ac 1:2 Continue MVI and trace elements to TPN + folic acid and thiamine with hx ETOH abuse Increase insulin R in TPN bag to 115 units, decrease SSI To Q6hr  Monitor TPN labs on Mon/Thurs F/u return of bowel function, PO intake vs ability to initiate tube feeds and wean off TPN   Thank you for allowing pharmacy to be a part of this patient's care.  Benetta Spar, PharmD, BCPS, BCCP Clinical Pharmacist  Please check AMION for all [place] phone numbers After 10:00 PM, call [place] [phone]

## 2021-01-20 NOTE — Progress Notes (Signed)
PROGRESS NOTE        PATIENT DETAILS Name: Travis [name] Age: 65 y.o. Sex: male Date of Birth: [birth year] Admit Date: 01/01/2021 Admitting Physician Georganna Skeans, MD EVO:[id number], Alyson Locket, NP  Brief Narrative: Patient is a 64 y.o. male with history of HFrEF, CKD stage III, COPD, HTN, DM-2, HLD, nonobstructive CAD-who presented with abdominal pain-found to have sepsis from ruptured appendicitis-underwent exploratory laparotomy/ileocecectomy and drainage of intra-abdominal abscess.Patient was briefly managed in the ICU-unfortunately-on 10/14 he developed severe abdominal pain -he was then found to have hemorrhagic shock due to a spontaneous splenic rupture-he was reevaluated by general surgery and underwent a splenectomy.  He was readmitted to the ICU-remained on the ventilator-he self extubated on 10/16-he was monitored closely-upon further stability-transfer to the [facility] hospitalist service on 10/20.  Significant events: 10/09>>admit- ex lap, remained intubated post op-to ICU 10/10>>extubated 10/12>> transferred to [facility] 10/14>>hypotensive/abd pain-CT showed spleenc rupture-to OR for splenectomy. 10/16>> Self-extubated  10/18>> Increased O2 requirement to 8L 10/19>> O2 weaned to 4L 10/20>> transferred to [facility]    Radiology/Echo 10/09>> CT abdomen: Acute appendicitis with perforation 10/14>> CT abdomen: Splenic rupture with large splenic hematoma 10/15>> TTE: EF 45-50% 10/20>> CT abdomen/pelvis: Indeterminate 2.4 cm fluid collection in the right lower quadrant adjacent to the cecal resection site.  Moderate left pleural effusion with compressive atelectasis. 10/24 >> CT - 1. Splenectomy with organizing complex fluid collections in the left upper quadrant. The largest collection there is 162 mL. Smaller adjacent collections, some inseparable from the gastric serosa and with associated mass effect and architectural distortion on the stomach. Again favor  hematoma/seroma. Abscess is not excluded, but there is no gas within the collections. 2. Stable small discontiguous rim enhancing fluid collections in the lower small bowel mesentery at the pelvic inlet a short distance from the percutaneous drain. These have not significantly changed and collectively are less than 10 mL  10/27 >> CT - 1. Interval placement of percutaneous pigtail drain catheter in the left upper quadrant within an air and fluid collection, the dominant component in which the catheter is placed measuring 7.9 x 6.6 cm, previously 8.5 x 6.2 cm. 2. Previously seen small, rim enhancing fluid collection in the right lower quadrant is not significantly changed, measuring 1.5 cm. 3. Redemonstrated postoperative findings status post appendectomy and splenectomy. There has been interval removal of a surgical drain in the right lower quadrant. 4. Cholelithiasis   Subjective: Patient in bed, peers to be in no distress denies any headache or chest pain, much better abdominal pain and discomfort, overall feels better today, no shortness of breath.   Objective: Vitals: Blood pressure 131/76, pulse 94, temperature 98.1 F (36.7 C), temperature source Oral, resp. rate 18, height 6' (1.829 m), weight 90.4 kg, SpO2 97 %.   Exam:  Awake Alert, No new F.N deficits, Normal affect [place].AT,PERRAL Supple Neck, No JVD,   Symmetrical Chest wall movement, Good air movement bilaterally, CTAB RRR,No Gallops, Rubs or new Murmurs,  +ve B.Sounds, Abd Soft,  W Vac and drain in place No Cyanosis, Clubbing or edema,       Assessment/Plan:   Sepsis due to acute appendicitis with perforation/intra-abdominal abscesses and Bacteroides bacteremia (s/p laparotomy/ileocecectomy and drainage of intra-abdominal abscess on 10/9) is finished Zosyn course for bacteroides, now growing E. coli from 01/17/2021 in the abscess: Sepsis physiology has resolved-Zosyn was discontinued on  01/12/21 .  On clear liquid diet + TNA.   CT abdomen on 01/16/21 and 01/19/2021 noted -  he had started spiking fevers again on 01/16/2021, case was discussed with general surgery IR placed another drain in the left upper quadrant to drain residual fluid on 01/17/2021 with abscess fluid now growing E. coli sensitive to Rocephin, also underwent left-sided thoracentesis on 01/17/2021 draining 600 cc of exudative fluid likely sympathetic effusion with negative gram strain, 1 out of 2 positive blood cultures likely contamination from staph epidermis stop vancomycin, antibiotics currently Rocephin and Flagyl combination in the light of present abscess cultures positive for E. Coli, clinically better on 01/20/21  Hemorrhagic shock due to spontaneous splenic rupture s/p splenectomy on 10/14: Continue to follow hemoglobin- plan on appropriate vaccinations 2 weeks from splenectomy on 01/20/21.    Acute hypoxic respiratory failure due to atelectasis/pleural effusion: Self extubated on 10/16-stable on anywhere from 2-4 L of oxygen-did develop some transient shortness of breath on 10/20-which responded to bronchodilators/diuretics.    Patient appears to have a moderate-sized left-sided pleural effusion which is likely transudative from-HFrEF/reactive to splenectomy.  PRN Lasix, also IR to do ultrasound-guided thoracentesis on 01/17/2021 I do not expect this to be parapneumonic or infectious .   Acute metabolic encephalopathy: Rapidly improving-continue supportive care.  AKI on CKD stage IIIa: AKI hemodynamically mediated-hold Lasix, post hydration and packed RBC transfusion, improved.  HTN: BP stable-continue Coreg-losartan remains on hold  Nonischemic cardiomyopathy/HFrEF: Not in exacerbation-continue Lasix and monitor volume status closely.  Follow electrolytes.   Nonobstructive CAD: No anginal symptoms-already on beta-blocker-we will resume aspirin/statin over the next few days when more stable.  DM-2 (A1c 6.4 on 06/02/2020): CBGs stable-continue  SSI  Recent Labs    01/20/21 0358 01/20/21 0742 01/20/21 1132  GLUCAP 115* 120* 147*     Anemia of chronic disease and acute disease.  1 unit of packed RBC transfusion on 01/18/2021, no signs of active bleeding, stable posttransfusion H&H.  HLD: Resume statin/fenofibrate when oral intake was stable.  COPD: Stable-continue bronchodilators  Functional quadriplegia/debility/deconditioning: Due to acute illness-continue PT/OT eval-either SNF or LTAC on discharge.  Other issues: Has subclavian TLC since 10/14-this was removed on 10/21-he now has a PICC line through which TNA is infusing.  Nutrition Status: Nutrition Problem: Moderate Malnutrition Etiology: acute illness, chronic illness (perforated appendix, CAD, cardiomyopathy) Signs/Symptoms: mild muscle depletion, mild fat depletion Interventions: TPN  Obesity: Estimated body mass index is 27.03 kg/m as calculated from the following:   Height as of this encounter: 6' (1.829 m).   Weight as of this encounter: 90.4 kg.    Procedures: 10/9>>exploratory laparotomy, ileocecectomy, drainage of intraabdominal abscess  10/14>> exploratory laparotomy and splenectomy 10/14>> left subclavian triple-lumen catheter 01/17/2021  >> left ultrasound-guided thoracentesis 600 cc exudative fluid, gram stain -ve 01/17/2021  >> LUQ JP drain by IR   Consults: General surgery, PCCM DVT Prophylaxis: Heparin Code Status:DNR Family Communication: Sister-in-law at bedside on 01/15/21.  Niece [name] [phone] over the phone on 01/17/2021, 01/20/21 message left at 12:25 PM  Time spent: 35 minutes-Greater than 50% of this time was spent in counseling, explanation of diagnosis, planning of further management, and coordination of care.  Diet: Diet Order             Diet heart healthy/carb modified Room service appropriate? Yes; Fluid consistency: Thin  Diet effective now                    Disposition Plan: Status is:  Inpatient  Remains inpatient appropriate because: Requires inpatient level of care.   Barriers to Discharge: Perforated appendix with pelvic abscess/splenic rupture-s/p laparotomy/splenectomy-not yet stable for discharge.     MEDICATIONS: Scheduled Meds:  acetaminophen  1,000 mg Oral Q6H   Chlorhexidine Gluconate Cloth  6 each Topical Daily   docusate sodium  100 mg Oral BID   feeding supplement  237 mL Oral TID BM   heparin injection (subcutaneous)  5,000 Units Subcutaneous Q8H   insulin aspart  0-20 Units Subcutaneous Q6H   mouth rinse  15 mL Mouth Rinse BID   methocarbamol  500 mg Oral QID   metoprolol tartrate  25 mg Oral BID   metroNIDAZOLE  500 mg Oral Q12H   mirtazapine  7.5 mg Oral QHS   oxyCODONE  20 mg Oral Q12H   pantoprazole sodium  40 mg Oral Daily   polyethylene glycol  17 g Oral Daily   Continuous Infusions:  sodium chloride     cefTRIAXone (ROCEPHIN)  IV 2 g (01/20/21 1144)   TPN ADULT (ION) 80 mL/hr at 01/19/21 1748   TPN ADULT (ION)     PRN Meds:.Place/Maintain arterial line **AND** sodium chloride, bisacodyl, dextromethorphan, HYDROmorphone (DILAUDID) injection, hydroxypropyl methylcellulose / hypromellose, ipratropium-albuterol, ondansetron (ZOFRAN) IV, oxyCODONE, phenol, sodium chloride, sodium chloride flush   I have personally reviewed following labs and imaging studies  LABORATORY DATA:  Recent Labs  Lab 01/16/21 0522 01/17/21 0424 01/18/21 0359 01/18/21 2000 01/19/21 0226 01/20/21 0453  WBC 19.5* 18.6* 14.9*  --  13.0* 12.8*  HGB 7.7* 7.4* 7.0* 8.1* 7.7* 8.0*  HCT 24.2* 23.7* 22.4* 25.4* 24.3* 25.0*  PLT 565* 584* 562*  --  523* 530*  MCV 94.9 96.0 96.6  --  92.4 91.9  MCH 30.2 30.0 30.2  --  29.3 29.4  MCHC 31.8 31.2 31.3  --  31.7 32.0  RDW 15.8* 16.1* 16.3*  --  18.4* 17.5*    Recent Labs  Lab 01/16/21 0522 01/16/21 0536 01/17/21 0424 01/18/21 0359 01/19/21 0226 01/20/21 0453  NA 136  --  137 139 139 139  K 4.0  --  4.8  4.6 4.2 3.9  CL 97*  --  99 104 105 104  CO2 29  --  26 27 27 27   GLUCOSE 201*  --  205* 166* 191* 124*  BUN 43*  --  52* 42* 34* 28*  CREATININE 1.43*  --  1.80* 1.58* 1.46* 1.20  CALCIUM 8.9  --  9.2 9.0 9.1 9.2  AST 27  --  44* 40 45* 41  ALT 25  --  36 37 42 42  ALKPHOS 171*  --  230* 230* 226* 241*  BILITOT 1.1  --  1.1 1.2 1.3* 1.0  ALBUMIN 1.7*  --  1.8* <1.5* 1.5* 1.6*  MG 2.1  --   --   --  2.2  --   PROCALCITON 0.65  --  0.80 0.57 0.41 0.34  LATICACIDVEN  --  0.9 1.5  --   --   --   BNP 61.3  --  74.1 212.5* 177.6* 185.7*          RADIOLOGY STUDIES/RESULTS: CT ABDOMEN PELVIS WO CONTRAST  Result Date: 01/19/2021 CLINICAL DATA:  Abdominal pain, history of ruptured appendicitis EXAM: CT ABDOMEN AND PELVIS WITHOUT CONTRAST TECHNIQUE: Multidetector CT imaging of the abdomen and pelvis was performed following the standard protocol without IV contrast. COMPARISON:  01/16/2021 FINDINGS: Lower chest: No acute abnormality. Hepatobiliary: No solid liver abnormality  is seen. Small gallstone near the gallbladder neck (series 3, image 39). Gallbladder wall thickening, or biliary dilatation. Pancreas: Unremarkable. No pancreatic ductal dilatation or surrounding inflammatory changes. Spleen: Status post splenectomy. Interval placement of percutaneous pigtail drain catheters in the left upper quadrant within an air and fluid collection, the dominant component in which the catheter is placed measuring 7.9 x 6.6 cm, previously 8.5 x 6.2 cm (series 3, image 21). Adrenals/Urinary Tract: Adrenal glands are unremarkable. Kidneys are normal, without renal calculi, solid lesion, or hydronephrosis. Bladder is unremarkable. Stomach/Bowel: Stomach is within normal limits. Enteric contrast present in the colon. Redemonstrated postoperative findings status post appendectomy. There has been interval removal of a surgical drain in the right lower quadrant. Previously seen small, rim enhancing fluid collection in  the right lower quadrant is not significantly changed, measuring 1.5 cm (series 3, image 65). Vascular/Lymphatic: Aortic atherosclerosis. No enlarged abdominal or pelvic lymph nodes. Reproductive: No mass or other significant abnormality. Other: No abdominal wall hernia or abnormality. No abdominopelvic ascites. Musculoskeletal: No acute or significant osseous findings. Status post right hip total arthroplasty. IMPRESSION: 1. Interval placement of percutaneous pigtail drain catheter in the left upper quadrant within an air and fluid collection, the dominant component in which the catheter is placed measuring 7.9 x 6.6 cm, previously 8.5 x 6.2 cm. 2. Previously seen small, rim enhancing fluid collection in the right lower quadrant is not significantly changed, measuring 1.5 cm. 3. Redemonstrated postoperative findings status post appendectomy and splenectomy. There has been interval removal of a surgical drain in the right lower quadrant. 4. Cholelithiasis. Aortic Atherosclerosis (ICD10-I70.0). Electronically Signed   By: Delanna Ahmadi M.D.   On: 01/19/2021 12:32   DG Chest Port 1 View  Result Date: 01/19/2021 CLINICAL DATA:  Shortness of breath. EXAM: PORTABLE CHEST 1 VIEW COMPARISON:  January 17, 2021. FINDINGS: Stable cardiomediastinal silhouette. Left-sided pleural drainage catheter is unchanged in position. Stable small left pleural effusion is noted with associated left basilar atelectasis. Mild right basilar atelectasis is noted as well. Bony thorax is unremarkable. PICC line is unchanged. No pneumothorax is noted. IMPRESSION: Stable left-sided pleural drainage catheter is noted with associated left pleural effusion and adjacent atelectasis. Stable mild right basilar atelectasis is noted. Electronically Signed   By: Marijo Conception M.D.   On: 01/19/2021 08:01     LOS: 19 days   Signature  Lala Lund M.D on 01/20/2021 at 12:25 PM   -  To page go to [email or web address]

## 2021-01-20 NOTE — Progress Notes (Signed)
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 Patient now has systemic evidence of infection with a complex fluid collection versus hematoma in the left subdiaphragmatic space with an associated left-sided pleural effusion. He presents for thoracentesis and percutaneous abscess drain placement, performed 01/17/21.  Subjective:  Lying in bed, no apparent distress, mentions tenderness at LUQ at site of drain.   Denies other complaints   Allergies: Patient has no known allergies.  Medications: Prior to Admission medications   Medication Sig Start Date End Date Taking? Authorizing Provider  albuterol (VENTOLIN HFA) 108 (90 Base) MCG/ACT inhaler Inhale 2 puffs into the lungs every 6 (six) hours as needed for wheezing or shortness of breath.   Yes [provider]  allopurinol (ZYLOPRIM) 100 MG tablet Take 1 tablet (100 mg total) by mouth 2 (two) times daily. 12/19/20  Yes [name], NP  aspirin 325 MG tablet Take 162.5 mg by mouth in the morning.   Yes [provider]  atorvastatin (LIPITOR) 20 MG tablet Take 20 mg by mouth daily.   Yes [provider]  carvedilol (COREG) 6.25 MG tablet Take 1 tablet (6.25 mg total) by mouth 2 (two) times daily. 06/03/20  Yes Theora Gianotti, NP  cetirizine (ZYRTEC) 10 MG tablet Take 1 tablet (10 mg total) by mouth daily. 07/07/20  Yes [name], NP  fenofibrate (TRICOR) 145 MG tablet TAKE 1 TABLET BY MOUTH ONCE A DAY 11/23/20  Yes Dutch Quint B, FNP  ferrous sulfate 325 (65 FE) MG tablet Take 1 tablet (325 mg total) by mouth daily with breakfast. 06/02/20  Yes Baity, Coralie Keens, NP  fluticasone (FLONASE) 50 MCG/ACT nasal spray Place 1-2 sprays into both nostrils daily as  needed for allergies or rhinitis.   Yes [provider]  furosemide (LASIX) 40 MG tablet Take 1 tablet by mouth daily.   Yes [provider]  hydroxypropyl methylcellulose / hypromellose (ISOPTO TEARS / GONIOVISC) 2.5 % ophthalmic solution Place 1 drop into both eyes 3 (three) times daily as needed for dry eyes.   Yes [provider]  losartan (COZAAR) 25 MG tablet Take 0.5 tablets (12.5 mg total) by mouth daily. 12/19/20 03/19/21 Yes [name], NP  magnesium oxide (MAG-OX) 400 MG tablet Take 1 tablet (400 mg total) by mouth daily. 12/19/20  Yes [name], NP  multivitamin (ONE-A-DAY MEN'S) TABS tablet Take 1 tablet by mouth daily with breakfast.   Yes [provider]  Omega-3 Fatty Acids (FISH OIL PO) Take 1 capsule by mouth daily.   Yes [provider]  pantoprazole (PROTONIX) 40 MG tablet Take 1 tablet (40 mg total) by mouth daily. 06/02/20  Yes Baity, Coralie Keens, NP  triamcinolone cream (KENALOG) 0.1 % APPLY TO AFFECTED AREAS TWICE A DAY AS NEEDED. AVOID FACE, GROIN, OR UNDERARMS. Patient taking differently: Apply 1 application topically 2 (two) times daily as needed (dry skin, rash). 11/01/20  Yes Dutch Quint B, FNP     Vital Signs: BP 131/76 (BP Location: Right Arm)   Pulse 94   Temp 98.1 F (36.7 C) (Oral)   Resp 18   Ht 6' (1.829 m)   Wt 199 lb 4.7 oz (90.4 kg)   SpO2 97%   BMI 27.03 kg/m   Physical Exam  Vitals reviewed.  Constitutional:      Appearance: He is ill-appearing.  HENT:     Head: Normocephalic.  Eyes:     Extraocular Movements: Extraocular movements intact.  Cardiovascular:     Rate and Rhythm: Normal rate.  Pulmonary:     Effort: Pulmonary effort is normal.  Abdominal:     General: Abdomen is flat.     Palpations: Abdomen is soft.     Tenderness: There is abdominal tenderness in the left upper quadrant.  Skin:    General: Skin is warm and dry.  Neurological:     Mental Status: He is alert.   Drain  Location: LUQ Size: Fr size: 12 Fr Date of placement: 01/16/21  Currently to: Drain collection device: suction bulb 24 hour output:  Output by Drain (mL) 01/18/21 0700 - 01/18/21 1459 01/18/21 1500 - 01/18/21 2259 01/18/21 2300 - 01/19/21 0659 01/19/21 0700 - 01/19/21 1459 01/19/21 1500 - 01/19/21 2259 01/19/21 2300 - 01/20/21 0659 01/20/21 0700 - 01/20/21 1459 01/20/21 1500 - 01/20/21 1540  Closed System Drain Left Abdomen Bulb (JP) 12 Fr.  30 20  30 20     Negative Pressure Wound Therapy Abdomen  50 0  0       Current examination: Flushes/aspirates easily.  Insertion site unremarkable. Suture and stat lock in place. Dressed appropriately.  Output: dark, sanguinous   Imaging: CT ABDOMEN PELVIS WO CONTRAST  Result Date: 01/19/2021 CLINICAL DATA:  Abdominal pain, history of ruptured appendicitis EXAM: CT ABDOMEN AND PELVIS WITHOUT CONTRAST TECHNIQUE: Multidetector CT imaging of the abdomen and pelvis was performed following the standard protocol without IV contrast. COMPARISON:  01/16/2021 FINDINGS: Lower chest: No acute abnormality. Hepatobiliary: No solid liver abnormality is seen. Small gallstone near the gallbladder neck (series 3, image 39). Gallbladder wall thickening, or biliary dilatation. Pancreas: Unremarkable. No pancreatic ductal dilatation or surrounding inflammatory changes. Spleen: Status post splenectomy. Interval placement of percutaneous pigtail drain catheters in the left upper quadrant within an air and fluid collection, the dominant component in which the catheter is placed measuring 7.9 x 6.6 cm, previously 8.5 x 6.2 cm (series 3, image 21). Adrenals/Urinary Tract: Adrenal glands are unremarkable. Kidneys are normal, without renal calculi, solid lesion, or hydronephrosis. Bladder is unremarkable. Stomach/Bowel: Stomach is within normal limits. Enteric contrast present in the colon. Redemonstrated postoperative findings status post appendectomy. There has been interval  removal of a surgical drain in the right lower quadrant. Previously seen small, rim enhancing fluid collection in the right lower quadrant is not significantly changed, measuring 1.5 cm (series 3, image 65). Vascular/Lymphatic: Aortic atherosclerosis. No enlarged abdominal or pelvic lymph nodes. Reproductive: No mass or other significant abnormality. Other: No abdominal wall hernia or abnormality. No abdominopelvic ascites. Musculoskeletal: No acute or significant osseous findings. Status post right hip total arthroplasty. IMPRESSION: 1. Interval placement of percutaneous pigtail drain catheter in the left upper quadrant within an air and fluid collection, the dominant component in which the catheter is placed measuring 7.9 x 6.6 cm, previously 8.5 x 6.2 cm. 2. Previously seen small, rim enhancing fluid collection in the right lower quadrant is not significantly changed, measuring 1.5 cm. 3. Redemonstrated postoperative findings status post appendectomy and splenectomy. There has been interval removal of a surgical drain in the right lower quadrant. 4. Cholelithiasis. Aortic Atherosclerosis (ICD10-I70.0). Electronically Signed   By: Delanna Ahmadi M.D.   On: 01/19/2021 12:32   DG Chest 1 View  Result Date: 01/17/2021 CLINICAL DATA:  Status post left thoracentesis.  EXAM: CHEST  1 VIEW COMPARISON:  Chest x-ray obtained earlier today FINDINGS: No evidence of pneumothorax. Reduced left pleural effusion. New percutaneous drainage catheter projects over the left upper quadrant. Left upper extremity PICC. Catheter tip terminates over the right atrium. Very low lung volumes. Pulmonary vascular congestion. IMPRESSION: No evidence of pneumothorax or other complication following left-sided thoracentesis. Electronically Signed   By: Jacqulynn Cadet M.D.   On: 01/17/2021 16:33   DG Chest 1 View  Result Date: 01/17/2021 CLINICAL DATA:  Shortness of breath. EXAM: CHEST  1 VIEW COMPARISON:  January 16, 2021. FINDINGS:  Stable cardiomediastinal silhouette. Left-sided PICC line is unchanged in position. Large left lung opacity is noted concerning for pneumonia or atelectasis with associated pleural effusion. Right basilar subsegmental atelectasis is noted. Status post right shoulder arthroplasty. IMPRESSION: Large left lung opacity is again noted concerning for pneumonia or atelectasis with associated pleural effusion. Electronically Signed   By: Marijo Conception M.D.   On: 01/17/2021 09:30   DG Abd 1 View  Result Date: 01/17/2021 CLINICAL DATA:  Abdominal pain. EXAM: ABDOMEN - 1 VIEW COMPARISON:  January 15, 2021. FINDINGS: The bowel gas pattern is normal. No radio-opaque calculi or other significant radiographic abnormality are seen. IMPRESSION: Negative. Electronically Signed   By: Marijo Conception M.D.   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 Patient now has systemic evidence of infection with a complex fluid collection versus hematoma in the left subdiaphragmatic space with an associated left-sided pleural effusion. He presents for thoracentesis and percutaneous abscess drain placement. EXAM: ULTRASOUND GUIDED ABSCESS DRAINAGE; US THORACENTESIS ASP PLEURAL SPACE W/IMG GUIDE MEDICATIONS: The patient is currently admitted to the hospital and receiving intravenous antibiotics. The antibiotics were administered within an appropriate time frame prior to the initiation of the procedure. ANESTHESIA/SEDATION: Fentanyl 75 mcg IV; Versed 1.5 mg IV Moderate Sedation Time: 27 minutes The patient was continuously monitored during the procedure by the interventional radiology nurse under my direct supervision. COMPLICATIONS: None immediate. PROCEDURE: Informed written consent was obtained from the patient after a thorough discussion of the  procedural risks, benefits and alternatives. All questions were addressed. Maximal Sterile Barrier Technique was utilized including caps, mask, sterile gowns, sterile gloves, sterile drape, hand hygiene and skin antiseptic. A timeout was performed prior to the initiation of the procedure. Attention was first turned to the right-sided pleural effusion. The right thorax was interrogated with ultrasound. A minimally complex pleural effusion is identified. Local anesthesia was attained by infiltration with 1% lidocaine. A small dermatotomy was made. Under real-time ultrasound guidance, a 5 [place] Yueh centesis catheter was advanced in the pleural space. The catheter was then attached to a evacuated bottle and thoracentesis was performed yielding approximately 600 mL serosanguineous fluid. A sample was reserved for labs. The Yueh centesis catheter was removed and a Band-Aid applied. Next, the left upper quadrant subdiaphragmatic collection was assessed sonographically. A highly complex fluid collection is present in the left upper quadrant. A suitable skin entry site was again selected and marked. Local anesthesia was attained by infiltration with 1% lidocaine. A small dermatotomy was made. Under real-time ultrasound guidance, a 17 gauge introducer needle was advanced into the fluid collection. Through this, a 0.035 wire was advanced into the fluid collection. The introducer needle was removed. The tract was dilated to 38 [place]. A Cook 12 [place] all-purpose drainage  catheter was advanced over the wire and into the fluid collection. Aspiration yields thick turbid bloody fluid. Approximately 40 mL was successfully aspirated. The catheter was then connected to JP bulb suction and secured to the skin with 0 Prolene suture and an adhesive fixation device. The patient tolerated the procedure well. IMPRESSION: 1. Successful left thoracentesis yielding 600 mL serosanguineous pleural fluid. 2. Successful placement of a 12 French  drainage catheter into the complex fluid collection in the left subdiaphragmatic space with aspiration of thick, turbid bloody fluid consistent with hematoma. A sample was sent for Gram stain and culture. Electronically Signed   By: Jacqulynn Cadet M.D.   On: 01/17/2021 16:32   DG Chest Port 1 View  Result Date: 01/19/2021 CLINICAL DATA:  Shortness of breath. EXAM: PORTABLE CHEST 1 VIEW COMPARISON:  January 17, 2021. FINDINGS: Stable cardiomediastinal silhouette. Left-sided pleural drainage catheter is unchanged in position. Stable small left pleural effusion is noted with associated left basilar atelectasis. Mild right basilar atelectasis is noted as well. Bony thorax is unremarkable. PICC line is unchanged. No pneumothorax is noted. IMPRESSION: Stable left-sided pleural drainage catheter is noted with associated left pleural effusion and adjacent atelectasis. Stable mild right basilar atelectasis is noted. Electronically Signed   By: Marijo Conception M.D.   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 Patient now has systemic evidence of infection with a complex fluid collection versus hematoma in the left subdiaphragmatic space with an associated left-sided pleural effusion. He presents for thoracentesis and percutaneous abscess drain placement. EXAM: ULTRASOUND GUIDED ABSCESS DRAINAGE; US THORACENTESIS ASP PLEURAL SPACE W/IMG GUIDE MEDICATIONS: The patient is currently admitted to the hospital and receiving intravenous antibiotics. The antibiotics were administered within an appropriate time frame prior to the initiation of the procedure. ANESTHESIA/SEDATION: Fentanyl 75 mcg IV; Versed 1.5 mg IV Moderate Sedation Time: 27 minutes The patient was continuously monitored during the procedure  by the interventional radiology nurse under my direct supervision. COMPLICATIONS: None immediate. PROCEDURE: Informed written consent was obtained from the patient after a thorough discussion of the procedural risks, benefits and alternatives. All questions were addressed. Maximal Sterile Barrier Technique was utilized including caps, mask, sterile gowns, sterile gloves, sterile drape, hand hygiene and skin antiseptic. A timeout was performed prior to the initiation of the procedure. Attention was first turned to the right-sided pleural effusion. The right thorax was interrogated with ultrasound. A minimally complex pleural effusion is identified. Local anesthesia was attained by infiltration with 1% lidocaine. A small dermatotomy was made. Under real-time ultrasound guidance, a 5 [place] Yueh centesis catheter was advanced in the pleural space. The catheter was then attached to a evacuated bottle and thoracentesis was performed yielding approximately 600 mL serosanguineous fluid. A sample was reserved for labs. The Yueh centesis catheter was removed and a Band-Aid applied. Next, the left upper quadrant subdiaphragmatic collection was assessed sonographically. A highly complex fluid collection is present in the left upper quadrant. A suitable skin entry site was again selected and marked. Local anesthesia was attained by infiltration with 1% lidocaine. A small dermatotomy was made. Under real-time ultrasound guidance, a 17 gauge introducer needle was advanced into the fluid collection. Through this, a 0.035 wire was advanced into the fluid collection. The introducer needle was removed. The tract was dilated to 36 [place]. A Cook 12 [place] all-purpose drainage  catheter was advanced over the wire and into the fluid collection. Aspiration yields thick turbid bloody fluid. Approximately 40 mL was successfully aspirated. The catheter was then connected to JP bulb suction and secured to the skin with 0 Prolene suture and an  adhesive fixation device. The patient tolerated the procedure well. IMPRESSION: 1. Successful left thoracentesis yielding 600 mL serosanguineous pleural fluid. 2. Successful placement of a 12 French drainage catheter into the complex fluid collection in the left subdiaphragmatic space with aspiration of thick, turbid bloody fluid consistent with hematoma. A sample was sent for Gram stain and culture. Electronically Signed   By: Jacqulynn Cadet M.D.   On: 01/17/2021 16:32    Labs:  CBC: Recent Labs    01/17/21 0424 01/18/21 0359 01/18/21 2000 01/19/21 0226 01/20/21 0453  WBC 18.6* 14.9*  --  13.0* 12.8*  HGB 7.4* 7.0* 8.1* 7.7* 8.0*  HCT 23.7* 22.4* 25.4* 24.3* 25.0*  PLT 584* 562*  --  523* 530*    COAGS: Recent Labs    01/01/21 1340 01/06/21 0308 01/06/21 0835  INR 1.1 1.3* 1.2  APTT  --   --  26    BMP: Recent Labs    01/17/21 0424 01/18/21 0359 01/19/21 0226 01/20/21 0453  NA 137 139 139 139  K 4.8 4.6 4.2 3.9  CL 99 104 105 104  CO2 26 27 27 27   GLUCOSE 205* 166* 191* 124*  BUN 52* 42* 34* 28*  CALCIUM 9.2 9.0 9.1 9.2  CREATININE 1.80* 1.58* 1.46* 1.20  GFRNONAA 41* 48* 53* >60    LIVER FUNCTION TESTS: Recent Labs    01/17/21 0424 01/18/21 0359 01/19/21 0226 01/20/21 0453  BILITOT 1.1 1.2 1.3* 1.0  AST 44* 40 45* 41  ALT 36 37 42 42  ALKPHOS 230* 230* 226* 241*  PROT 6.4* 6.0* 6.1* 6.3*  ALBUMIN 1.8* <1.5* 1.5* 1.6*    Assessment and Plan:  LUQ drain: Continue TID flushes with 5 cc NS. Record output Q shift. Dressing changes QD or PRN if soiled.  Call IR APP or on call IR MD if difficulty flushing or sudden change in drain output.  Repeat imaging/possible drain injection once output < 10 mL/QD (excluding flush material.)  Discharge planning: Please contact IR APP or on call IR MD prior to patient d/c to ensure appropriate follow up plans are in place. Typically patient will follow up with IR clinic 10-14 days post d/c for repeat  imaging/possible drain injection. IR scheduler will contact patient with date/time of appointment. Patient will need to flush drain QD with 5 cc NS, record output QD, dressing changes every 2-3 days or earlier if soiled.   IR will continue to follow - please call with questions or concerns.  Electronically Signed: Pasty Spillers, PA 01/20/2021, 3:33 PM   I spent a total of 25 Minutes at the the patient's bedside AND on the patient's hospital floor or unit, greater than 50% of which was counseling/coordinating care for LUQ drain

## 2021-01-20 NOTE — TOC Initial Note (Signed)
Transition of Care [facility]) - Initial/Assessment Note    Patient Details  Name: Travis [name] MRN: [id number] Date of Birth: 1956-02-29  Transition of Care [facility]) CM/SW Contact:    Benard Halsted, LCSW Phone Number: 01/20/2021, 11:45 AM  Clinical Narrative:                 Per MD, patient may be stable for discharge to [facility] next week. Niece had preference for [facility] rather than SNF; Liaison starting insurance process to see if patient would be approved. Otherwise, patient would require SNF placement. Will continue to follow.   Expected Discharge Plan: Long Term Acute Care (LTAC) Barriers to Discharge: Continued Medical Work up, Ship broker   Patient Goals and CMS Choice Patient states their goals for this hospitalization and ongoing recovery are:: Feel better CMS Medicare.gov Compare Post Acute Care list provided to:: Patient Represenative (must comment) Choice offered to / list presented to :  (Niece)  Expected Discharge Plan and Services Expected Discharge Plan: Long Term Acute Care (LTAC) In-house Referral: Clinical Social Work   Post Acute Care Choice: Long Term Acute Care (LTAC) Living arrangements for the past 2 months: Single Family Home                                      Prior Living Arrangements/Services Living arrangements for the past 2 months: Single Family Home Lives with:: Self Patient language and need for interpreter reviewed:: Yes Do you feel safe going back to the place where you live?: Yes      Need for Family Participation in Patient Care: Yes (Comment) Care giver support system in place?: Yes (comment)   Criminal Activity/Legal Involvement Pertinent to Current Situation/Hospitalization: No - Comment as needed  Activities of Daily Living      Permission Sought/Granted Permission sought to share information with : Facility Sport and exercise psychologist, Family Supports Permission granted to share information with : No  Share  Information with NAME: [name]  Permission granted to share info w AGENCY: LTACH/SNF  Permission granted to share info w Relationship: Niece  Permission granted to share info w Contact Information: [phone]  Emotional Assessment Appearance:: Appears stated age Attitude/Demeanor/Rapport: Unable to Assess Affect (typically observed): Unable to Assess Orientation: : Oriented to Self, Oriented to Place Alcohol / Substance Use: Not Applicable Psych Involvement: No (comment)  Admission diagnosis:  Pain [R52] Ruptured appendicitis [K35.32] Status post surgery [Z98.890] Patient Active Problem List   Diagnosis Date Noted   Malnutrition of moderate degree 01/05/2021   Ruptured appendicitis 01/01/2021   Sepsis ([place]) 01/01/2021   Status post surgery 01/01/2021   Protein-calorie malnutrition, severe 05/27/2020   Hypotension 05/26/2020   AKI (acute kidney injury) ([place]) 05/26/2020   Alcohol use 11/20/2019   CKD (chronic kidney disease), stage III ([place]) 11/20/2019   TIA (transient ischemic attack) 11/08/2019   Osteoarthritis 05/09/2016   COPD (chronic obstructive pulmonary disease) ([place]) 05/09/2016   Essential hypertension 04/18/2015   Literacy level of illiterate 06/04/2014   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   

## 2021-01-21 ENCOUNTER — Inpatient Hospital Stay (HOSPITAL_COMMUNITY): Payer: Medicare HMO

## 2021-01-21 DIAGNOSIS — Z515 Encounter for palliative care: Secondary | ICD-10-CM | POA: Diagnosis not present

## 2021-01-21 LAB — COMPREHENSIVE METABOLIC PANEL
ALT: 34 U/L (ref 0–44)
AST: 28 U/L (ref 15–41)
Albumin: 1.6 g/dL — ABNORMAL LOW (ref 3.5–5.0)
Alkaline Phosphatase: 221 U/L — ABNORMAL HIGH (ref 38–126)
Anion gap: 7 (ref 5–15)
BUN: 25 mg/dL — ABNORMAL HIGH (ref 8–23)
CO2: 26 mmol/L (ref 22–32)
Calcium: 9.3 mg/dL (ref 8.9–10.3)
Chloride: 104 mmol/L (ref 98–111)
Creatinine, Ser: 1.11 mg/dL (ref 0.61–1.24)
GFR, Estimated: >60 mL/min (ref >60)
Glucose, Bld: 175 mg/dL — ABNORMAL HIGH (ref 70–99)
Potassium: 4.1 mmol/L (ref 3.5–5.1)
Sodium: 137 mmol/L (ref 135–145)
Total Bilirubin: 0.8 mg/dL (ref 0.3–1.2)
Total Protein: 6.4 g/dL — ABNORMAL LOW (ref 6.5–8.1)

## 2021-01-21 LAB — CBC
HCT: 28 % — ABNORMAL LOW (ref 39.0–52.0)
Hemoglobin: 8.6 g/dL — ABNORMAL LOW (ref 13.0–17.0)
MCH: 28.9 pg (ref 26.0–34.0)
MCHC: 30.7 g/dL (ref 30.0–36.0)
MCV: 94 fL (ref 80.0–100.0)
Platelets: 552 10*3/uL — ABNORMAL HIGH (ref 150–400)
RBC: 2.98 MIL/uL — ABNORMAL LOW (ref 4.22–5.81)
RDW: 17.2 % — ABNORMAL HIGH (ref 11.5–15.5)
WBC: 10.4 10*3/uL (ref 4.0–10.5)
nRBC: 1.2 % — ABNORMAL HIGH (ref 0.0–0.2)

## 2021-01-21 LAB — GLUCOSE, CAPILLARY
Glucose-Capillary: 157 mg/dL — ABNORMAL HIGH (ref 70–99)
Glucose-Capillary: 168 mg/dL — ABNORMAL HIGH (ref 70–99)
Glucose-Capillary: 171 mg/dL — ABNORMAL HIGH (ref 70–99)
Glucose-Capillary: 175 mg/dL — ABNORMAL HIGH (ref 70–99)
Glucose-Capillary: 191 mg/dL — ABNORMAL HIGH (ref 70–99)

## 2021-01-21 LAB — BRAIN NATRIURETIC PEPTIDE: B Natriuretic Peptide: 124.9 pg/mL — ABNORMAL HIGH (ref 0.0–100.0)

## 2021-01-21 LAB — PROCALCITONIN: Procalcitonin: 0.26 ng/mL

## 2021-01-21 MED ORDER — INSULIN REGULAR HUMAN 100 UNIT/ML IJ SOLN
INTRAMUSCULAR | Status: DC
Start: 2021-01-21 — End: 2021-01-22
  Filled 2021-01-21: qty 1296

## 2021-01-21 NOTE — Progress Notes (Signed)
Referring Physician(s): Dr Candiss Norse  Supervising Physician: Juliet Rude  Patient Status:  [facility] - In-pt  Chief Complaint:  LUQ abscess drain- 10/25 in IR  Subjective:  Feeling better OP still dark blood Flushes easily Denies pain or fever   Allergies: Patient has no known allergies.  Medications: Prior to Admission medications   Medication Sig Start Date End Date Taking? Authorizing Provider  albuterol (VENTOLIN HFA) 108 (90 Base) MCG/ACT inhaler Inhale 2 puffs into the lungs every 6 (six) hours as needed for wheezing or shortness of breath.   Yes [provider]  allopurinol (ZYLOPRIM) 100 MG tablet Take 1 tablet (100 mg total) by mouth 2 (two) times daily. 12/19/20  Yes [name], NP  aspirin 325 MG tablet Take 162.5 mg by mouth in the morning.   Yes [provider]  atorvastatin (LIPITOR) 20 MG tablet Take 20 mg by mouth daily.   Yes [provider]  carvedilol (COREG) 6.25 MG tablet Take 1 tablet (6.25 mg total) by mouth 2 (two) times daily. 06/03/20  Yes Theora Gianotti, NP  cetirizine (ZYRTEC) 10 MG tablet Take 1 tablet (10 mg total) by mouth daily. 07/07/20  Yes [name], NP  fenofibrate (TRICOR) 145 MG tablet TAKE 1 TABLET BY MOUTH ONCE A DAY 11/23/20  Yes Dutch Quint B, FNP  ferrous sulfate 325 (65 FE) MG tablet Take 1 tablet (325 mg total) by mouth daily with breakfast. 06/02/20  Yes Baity, Coralie Keens, NP  fluticasone (FLONASE) 50 MCG/ACT nasal spray Place 1-2 sprays into both nostrils daily as needed for allergies or rhinitis.   Yes [provider]  furosemide (LASIX) 40 MG tablet Take 1 tablet by mouth daily.   Yes [provider]  hydroxypropyl methylcellulose / hypromellose (ISOPTO TEARS / GONIOVISC) 2.5 % ophthalmic solution Place 1 drop into both eyes 3 (three) times daily as needed for dry eyes.   Yes [provider]  losartan (COZAAR) 25 MG tablet Take 0.5 tablets (12.5 mg total) by  mouth daily. 12/19/20 03/19/21 Yes [name], NP  magnesium oxide (MAG-OX) 400 MG tablet Take 1 tablet (400 mg total) by mouth daily. 12/19/20  Yes [name], NP  multivitamin (ONE-A-DAY MEN'S) TABS tablet Take 1 tablet by mouth daily with breakfast.   Yes [provider]  Omega-3 Fatty Acids (FISH OIL PO) Take 1 capsule by mouth daily.   Yes [provider]  pantoprazole (PROTONIX) 40 MG tablet Take 1 tablet (40 mg total) by mouth daily. 06/02/20  Yes Baity, Coralie Keens, NP  triamcinolone cream (KENALOG) 0.1 % APPLY TO AFFECTED AREAS TWICE A DAY AS NEEDED. AVOID FACE, GROIN, OR UNDERARMS. Patient taking differently: Apply 1 application topically 2 (two) times daily as needed (dry skin, rash). 11/01/20  Yes Dutch Quint B, FNP     Vital Signs: BP 121/75 (BP Location: Right Arm)   Pulse 84   Temp 98.4 F (36.9 C) (Oral)   Resp 16   Ht 6' (1.829 m)   Wt 199 lb 4.7 oz (90.4 kg)   SpO2 95%   BMI 27.03 kg/m   Physical Exam Skin:    General: Skin is warm.     Comments: Site is clean and dry NT No bleeding OP is dark blood Flushes easily 25 cc OP yesterday Organism ID, Bacteria ESCHERICHIA COLI      Imaging: CT ABDOMEN PELVIS WO CONTRAST  Result Date: 01/19/2021 CLINICAL DATA:  Abdominal pain, history of  ruptured appendicitis EXAM: CT ABDOMEN AND PELVIS WITHOUT CONTRAST TECHNIQUE: Multidetector CT imaging of the abdomen and pelvis was performed following the standard protocol without IV contrast. COMPARISON:  01/16/2021 FINDINGS: Lower chest: No acute abnormality. Hepatobiliary: No solid liver abnormality is seen. Small gallstone near the gallbladder neck (series 3, image 39). Gallbladder wall thickening, or biliary dilatation. Pancreas: Unremarkable. No pancreatic ductal dilatation or surrounding inflammatory changes. Spleen: Status post splenectomy. Interval placement of percutaneous pigtail drain catheters in the left upper quadrant within an air and fluid  collection, the dominant component in which the catheter is placed measuring 7.9 x 6.6 cm, previously 8.5 x 6.2 cm (series 3, image 21). Adrenals/Urinary Tract: Adrenal glands are unremarkable. Kidneys are normal, without renal calculi, solid lesion, or hydronephrosis. Bladder is unremarkable. Stomach/Bowel: Stomach is within normal limits. Enteric contrast present in the colon. Redemonstrated postoperative findings status post appendectomy. There has been interval removal of a surgical drain in the right lower quadrant. Previously seen small, rim enhancing fluid collection in the right lower quadrant is not significantly changed, measuring 1.5 cm (series 3, image 65). Vascular/Lymphatic: Aortic atherosclerosis. No enlarged abdominal or pelvic lymph nodes. Reproductive: No mass or other significant abnormality. Other: No abdominal wall hernia or abnormality. No abdominopelvic ascites. Musculoskeletal: No acute or significant osseous findings. Status post right hip total arthroplasty. IMPRESSION: 1. Interval placement of percutaneous pigtail drain catheter in the left upper quadrant within an air and fluid collection, the dominant component in which the catheter is placed measuring 7.9 x 6.6 cm, previously 8.5 x 6.2 cm. 2. Previously seen small, rim enhancing fluid collection in the right lower quadrant is not significantly changed, measuring 1.5 cm. 3. Redemonstrated postoperative findings status post appendectomy and splenectomy. There has been interval removal of a surgical drain in the right lower quadrant. 4. Cholelithiasis. Aortic Atherosclerosis (ICD10-I70.0). Electronically Signed   By: Delanna Ahmadi M.D.   On: 01/19/2021 12:32   DG Chest 1 View  Result Date: 01/17/2021 CLINICAL DATA:  Status post left thoracentesis. EXAM: CHEST  1 VIEW COMPARISON:  Chest x-ray obtained earlier today FINDINGS: No evidence of pneumothorax. Reduced left pleural effusion. New percutaneous drainage catheter projects over the  left upper quadrant. Left upper extremity PICC. Catheter tip terminates over the right atrium. Very low lung volumes. Pulmonary vascular congestion. IMPRESSION: No evidence of pneumothorax or other complication following left-sided thoracentesis. Electronically Signed   By: Jacqulynn Cadet M.D.   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 Patient now has systemic evidence of infection with a complex fluid collection versus hematoma in the left subdiaphragmatic space with an associated left-sided pleural effusion. He presents for thoracentesis and percutaneous abscess drain placement. EXAM: ULTRASOUND GUIDED ABSCESS DRAINAGE; US THORACENTESIS ASP PLEURAL SPACE W/IMG GUIDE MEDICATIONS: The patient is currently admitted to the hospital and receiving intravenous antibiotics. The antibiotics were administered within an appropriate time frame prior to the initiation of the procedure. ANESTHESIA/SEDATION: Fentanyl 75 mcg IV; Versed 1.5 mg IV Moderate Sedation Time: 27 minutes The patient was continuously monitored during the procedure by the interventional radiology nurse under my direct supervision. COMPLICATIONS: None immediate. PROCEDURE: Informed written consent was obtained from the patient after a thorough discussion of the procedural risks, benefits and alternatives. All questions were addressed. Maximal Sterile Barrier Technique was utilized including caps, mask, sterile gowns, sterile gloves, sterile drape, hand hygiene  and skin antiseptic. A timeout was performed prior to the initiation of the procedure. Attention was first turned to the right-sided pleural effusion. The right thorax was interrogated with ultrasound. A minimally complex pleural effusion is identified. Local anesthesia was attained by infiltration with 1%  lidocaine. A small dermatotomy was made. Under real-time ultrasound guidance, a 5 [place] Yueh centesis catheter was advanced in the pleural space. The catheter was then attached to a evacuated bottle and thoracentesis was performed yielding approximately 600 mL serosanguineous fluid. A sample was reserved for labs. The Yueh centesis catheter was removed and a Band-Aid applied. Next, the left upper quadrant subdiaphragmatic collection was assessed sonographically. A highly complex fluid collection is present in the left upper quadrant. A suitable skin entry site was again selected and marked. Local anesthesia was attained by infiltration with 1% lidocaine. A small dermatotomy was made. Under real-time ultrasound guidance, a 17 gauge introducer needle was advanced into the fluid collection. Through this, a 0.035 wire was advanced into the fluid collection. The introducer needle was removed. The tract was dilated to 64 [place]. A Cook 12 [place] all-purpose drainage catheter was advanced over the wire and into the fluid collection. Aspiration yields thick turbid bloody fluid. Approximately 40 mL was successfully aspirated. The catheter was then connected to JP bulb suction and secured to the skin with 0 Prolene suture and an adhesive fixation device. The patient tolerated the procedure well. IMPRESSION: 1. Successful left thoracentesis yielding 600 mL serosanguineous pleural fluid. 2. Successful placement of a 12 French drainage catheter into the complex fluid collection in the left subdiaphragmatic space with aspiration of thick, turbid bloody fluid consistent with hematoma. A sample was sent for Gram stain and culture. Electronically Signed   By: Jacqulynn Cadet M.D.   On: 01/17/2021 16:32   DG Chest Port 1 View  Result Date: 01/21/2021 CLINICAL DATA:  Shortness of breath EXAM: PORTABLE CHEST 1 VIEW COMPARISON:  Two days ago FINDINGS: Low volume chest with artifact from EKG leads. Left PICC with tip at the upper  right atrium. Left upper quadrant drain. Normal heart size and stable mediastinal contours. Unchanged haziness of the left more than right chest with atelectasis and pleural fluid. IMPRESSION: Stable atelectasis and pleural fluid asymmetric to the left. Electronically Signed   By: Jorje Guild M.D.   On: 01/21/2021 08:04   DG Chest Port 1 View  Result Date: 01/19/2021 CLINICAL DATA:  Shortness of breath. EXAM: PORTABLE CHEST 1 VIEW COMPARISON:  January 17, 2021. FINDINGS: Stable cardiomediastinal silhouette. Left-sided pleural drainage catheter is unchanged in position. Stable small left pleural effusion is noted with associated left basilar atelectasis. Mild right basilar atelectasis is noted as well. Bony thorax is unremarkable. PICC line is unchanged. No pneumothorax is noted. IMPRESSION: Stable left-sided pleural drainage catheter is noted with associated left pleural effusion and adjacent atelectasis. Stable mild right basilar atelectasis is noted. Electronically Signed   By: Marijo Conception M.D.   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 Patient now has systemic evidence of infection with a complex fluid collection versus hematoma in the left subdiaphragmatic space with an associated left-sided pleural effusion. He presents for thoracentesis and percutaneous abscess drain placement. EXAM: ULTRASOUND GUIDED ABSCESS DRAINAGE; US THORACENTESIS ASP PLEURAL SPACE W/IMG GUIDE MEDICATIONS: The patient is currently admitted to the hospital and  receiving intravenous antibiotics. The antibiotics were administered within an appropriate time frame prior to the initiation of the procedure. ANESTHESIA/SEDATION: Fentanyl 75 mcg IV; Versed 1.5 mg IV Moderate Sedation Time: 27 minutes The patient  was continuously monitored during the procedure by the interventional radiology nurse under my direct supervision. COMPLICATIONS: None immediate. PROCEDURE: Informed written consent was obtained from the patient after a thorough discussion of the procedural risks, benefits and alternatives. All questions were addressed. Maximal Sterile Barrier Technique was utilized including caps, mask, sterile gowns, sterile gloves, sterile drape, hand hygiene and skin antiseptic. A timeout was performed prior to the initiation of the procedure. Attention was first turned to the right-sided pleural effusion. The right thorax was interrogated with ultrasound. A minimally complex pleural effusion is identified. Local anesthesia was attained by infiltration with 1% lidocaine. A small dermatotomy was made. Under real-time ultrasound guidance, a 5 [place] Yueh centesis catheter was advanced in the pleural space. The catheter was then attached to a evacuated bottle and thoracentesis was performed yielding approximately 600 mL serosanguineous fluid. A sample was reserved for labs. The Yueh centesis catheter was removed and a Band-Aid applied. Next, the left upper quadrant subdiaphragmatic collection was assessed sonographically. A highly complex fluid collection is present in the left upper quadrant. A suitable skin entry site was again selected and marked. Local anesthesia was attained by infiltration with 1% lidocaine. A small dermatotomy was made. Under real-time ultrasound guidance, a 17 gauge introducer needle was advanced into the fluid collection. Through this, a 0.035 wire was advanced into the fluid collection. The introducer needle was removed. The tract was dilated to 61 [place]. A Cook 12 [place] all-purpose drainage catheter was advanced over the wire and into the fluid collection. Aspiration yields thick turbid bloody fluid. Approximately 40 mL was successfully aspirated. The catheter was then connected to JP bulb suction and  secured to the skin with 0 Prolene suture and an adhesive fixation device. The patient tolerated the procedure well. IMPRESSION: 1. Successful left thoracentesis yielding 600 mL serosanguineous pleural fluid. 2. Successful placement of a 12 French drainage catheter into the complex fluid collection in the left subdiaphragmatic space with aspiration of thick, turbid bloody fluid consistent with hematoma. A sample was sent for Gram stain and culture. Electronically Signed   By: Jacqulynn Cadet M.D.   On: 01/17/2021 16:32    Labs:  CBC: Recent Labs    01/18/21 0359 01/18/21 2000 01/19/21 0226 01/20/21 0453 01/21/21 0131  WBC 14.9*  --  13.0* 12.8* 10.4  HGB 7.0* 8.1* 7.7* 8.0* 8.6*  HCT 22.4* 25.4* 24.3* 25.0* 28.0*  PLT 562*  --  523* 530* 552*    COAGS: Recent Labs    01/01/21 1340 01/06/21 0308 01/06/21 0835  INR 1.1 1.3* 1.2  APTT  --   --  26    BMP: Recent Labs    01/18/21 0359 01/19/21 0226 01/20/21 0453 01/21/21 0131  NA 139 139 139 137  K 4.6 4.2 3.9 4.1  CL 104 105 104 104  CO2 27 27 27 26   GLUCOSE 166* 191* 124* 175*  BUN 42* 34* 28* 25*  CALCIUM 9.0 9.1 9.2 9.3  CREATININE 1.58* 1.46* 1.20 1.11  GFRNONAA 48* 53* >60 >60    LIVER FUNCTION TESTS: Recent Labs    01/18/21 0359 01/19/21 0226 01/20/21 0453 01/21/21 0131  BILITOT 1.2 1.3* 1.0 0.8  AST 40 45* 41 28  ALT 37 42 42 34  ALKPHOS 230* 226* 241* 221*  PROT 6.0* 6.1* 6.3* 6.4*  ALBUMIN <1.5* 1.5* 1.6* 1.6*    Assessment and Plan:  LUQ abscess drain placed 10/25 in IR Still significant OP (25 cc yesterday) Consider Re CT when OP less than 10 cc /24 hr Will follow  Electronically Signed: Lavonia Drafts, PA-C 01/21/2021, 2:07 PM   I spent a total of 15 Minutes at the the patient's bedside AND on the patient's hospital floor or unit, greater than 50% of which was counseling/coordinating care for LUQ abscess

## 2021-01-21 NOTE — Progress Notes (Signed)
PHARMACY - TOTAL PARENTERAL NUTRITION CONSULT NOTE   Indication: Prolonged ileus  Patient Measurements: Height: 6' (182.9 cm) Weight: 90.4 kg (199 lb 4.7 oz) IBW/kg (Calculated) : 77.6   Body mass index is 27.03 kg/m.  Assessment: 65 YO male who presented 10/09 with acute perforated appendix and abdominal abscess s/p OR for ex-lap, ilececectomy, and drainage of abscess on 10/09. Also had splenic laceration, s/p ex-lap, splenectomy, and wound vac application 26/37. Transferred to ICU on 10/12 with septic vs. hemorrhagic shock, intubated and on vasopressor support. Plan was initially to transition to comfort measures 10/14; however improved overnight with decreasing vasopressor requirement. Post-op ileus. Pharmacy consulted to start TPN.   Palliative care was consulted 10/26 due to ongoing declining state. TPN decreased to 80% 10/23-10/27 to help with appetite stimulation but patient still not eating much. Possible discharge to [facility].    Glucose / Insulin: hx DM2 (no meds PTA). BG <180, used 15 units of SSI + 90 units of regular insulin in TPN bag Electrolytes: phos 4.7, CoCa 11.2, other wnl Renal: Scr 1.11, BUN 25 both down  Hepatic: LFTs wnl, albumin 1.6, TG 139  Intake / Output; MIVF: UOP 0.8 ml/kg/hr, drain 25 ml. NGT removed 10/20, last charted BM 10/21  GI Imaging:  10/09>> CT abdomen: Acute appendicitis with perforation 10/14>> CT abdomen: Splenic rupture with large splenic hematoma 10/20>> CT abdomen: Indeterminate 2.4 cm fluid collection in the right lower quadrant adjacent to the cecal resection site. 10/24 >> CT abdomen: Splenectomy with organizing complex fluid collections in the left upper quadrant. The largest collection there is 162 mL. Stable small discontiguous rim enhancing fluid collections in the lower small bowel mesentery at the pelvic inlet a short distance from the percutaneous drain. These have not significantly changed and collectively are less than 10 mL 10/27 CT  Abd: drain in, same fluid collection GI Surgeries / Procedures:  10/25 thoracentesis 672ml and drain placement  Central access: CVC triple lumen 10/14 >> 10/21; PICC 10/21 >> TPN start date: 01/08/21  Nutritional Goals: Goal TPN rate is 100 mL/hr (provides 130 g of protein and 2422 kcals per day)  RD Assessment: Estimated Needs Total Energy Estimated Needs: 2400-2600 Total Protein Estimated Needs: 125-150 grams Total Fluid Estimated Needs: > 2 L  Current Nutrition:  Full diet (not eating much) and TPN  Plan:  Continue TPN at goal rate of 100 ml/hr to provide 2422 kcal and 130 g protein meeting 100 % of estimated needs   Electrolytes in TPN: increase Na 60 meq/L, K 30 mEq/L, Mg 3 mEq/L, Ca 0 mEq/L, Phos 5 mmol/L, Cl:Ac 1:2 Continue MVI and trace elements to TPN + folic acid and thiamine with hx ETOH abuse Continue insulin R in TPN bag at 115 units + SSI Q6hr  Monitor TPN labs on Mon/Thurs F/u return of bowel function, PO intake vs ability to initiate tube feeds and wean off TPN   Thank you for allowing pharmacy to be a part of this patient's care.  Albertina Parr, PharmD., BCPS, BCCCP Clinical Pharmacist Please refer to [facility] for unit-specific pharmacist

## 2021-01-21 NOTE — Progress Notes (Signed)
Progress Note  15 Days Post-Op  Subjective: Patient states he is feeling well. Denies nausea or vomiting. Hasn't taken a lot by mouth yet though. Unsure of when last BM was.   Objective: Vital signs in last 24 hours: Temp:  [97.9 F (36.6 C)-101.6 F (38.7 C)] 97.9 F (36.6 C) (10/29 0811) Pulse Rate:  [90-108] 94 (10/29 0811) Resp:  [16-21] 19 (10/29 0811) BP: (110-146)/(69-92) 118/71 (10/29 0811) SpO2:  [92 %-97 %] 94 % (10/29 0811) Last BM Date: 01/13/21 (gave docusate and miralax, TPN)  Intake/Output from previous day: 10/28 0701 - 10/29 0700 In: 2647.1 [P.O.:420; I.V.:2227.1] Out: 1725 [Urine:1700; Drains:25] Intake/Output this shift: Total I/O In: 74 [P.O.:60] Out: -   PE: Gen:  Alert, NAD, pleasant Card:  rrr Pulm:  comfortable unlabored breathing Abd: Soft, mild distension, moderate right sided abdominal TTP, no left sided tenderness and no diffuse peritonitis, vac to midline incision with good seal, LUQ drain with dark bloody fluid   Lab Results:  Recent Labs    01/20/21 0453 01/21/21 0131  WBC 12.8* 10.4  HGB 8.0* 8.6*  HCT 25.0* 28.0*  PLT 530* 552*   BMET Recent Labs    01/20/21 0453 01/21/21 0131  NA 139 137  K 3.9 4.1  CL 104 104  CO2 27 26  GLUCOSE 124* 175*  BUN 28* 25*  CREATININE 1.20 1.11  CALCIUM 9.2 9.3   PT/INR No results for input(s): LABPROT, INR in the last 72 hours. CMP     Component Value Date/Time   NA 137 01/21/2021 0131   NA 138 06/20/2020 1034   NA 141 04/20/2014 1019   K 4.1 01/21/2021 0131   K 3.7 04/20/2014 1019   CL 104 01/21/2021 0131   CL 106 04/20/2014 1019   CO2 26 01/21/2021 0131   CO2 30 04/20/2014 1019   GLUCOSE 175 (H) 01/21/2021 0131   GLUCOSE 102 (H) 04/20/2014 1019   BUN 25 (H) 01/21/2021 0131   BUN 18 06/20/2020 1034   BUN 14 04/20/2014 1019   CREATININE 1.11 01/21/2021 0131   CREATININE 1.18 04/20/2014 1019   CALCIUM 9.3 01/21/2021 0131   CALCIUM 9.3 04/20/2014 1019   PROT 6.4 (L)  01/21/2021 0131   PROT 6.5 06/18/2017 1425   PROT 7.4 04/20/2014 1019   ALBUMIN 1.6 (L) 01/21/2021 0131   ALBUMIN 4.0 06/18/2017 1425   ALBUMIN 3.4 04/20/2014 1019   AST 28 01/21/2021 0131   AST 21 04/20/2014 1019   ALT 34 01/21/2021 0131   ALT 20 04/20/2014 1019   ALKPHOS 221 (H) 01/21/2021 0131   ALKPHOS 97 04/20/2014 1019   BILITOT 0.8 01/21/2021 0131   BILITOT 0.4 06/18/2017 1425   BILITOT 0.6 04/20/2014 1019   GFRNONAA >60 01/21/2021 0131   GFRNONAA >60 04/20/2014 1019   GFRNONAA >60 11/19/2012 1107   GFRAA 53 (L) 01/13/2020 1410   GFRAA >60 04/20/2014 1019   GFRAA >60 11/19/2012 1107   Lipase     Component Value Date/Time   LIPASE 31 01/01/2021 1023   LIPASE 112 04/20/2014 1019       Studies/Results: CT ABDOMEN PELVIS WO CONTRAST  Result Date: 01/19/2021 CLINICAL DATA:  Abdominal pain, history of ruptured appendicitis EXAM: CT ABDOMEN AND PELVIS WITHOUT CONTRAST TECHNIQUE: Multidetector CT imaging of the abdomen and pelvis was performed following the standard protocol without IV contrast. COMPARISON:  01/16/2021 FINDINGS: Lower chest: No acute abnormality. Hepatobiliary: No solid liver abnormality is seen. Small gallstone near the gallbladder neck (series  3, image 39). Gallbladder wall thickening, or biliary dilatation. Pancreas: Unremarkable. No pancreatic ductal dilatation or surrounding inflammatory changes. Spleen: Status post splenectomy. Interval placement of percutaneous pigtail drain catheters in the left upper quadrant within an air and fluid collection, the dominant component in which the catheter is placed measuring 7.9 x 6.6 cm, previously 8.5 x 6.2 cm (series 3, image 21). Adrenals/Urinary Tract: Adrenal glands are unremarkable. Kidneys are normal, without renal calculi, solid lesion, or hydronephrosis. Bladder is unremarkable. Stomach/Bowel: Stomach is within normal limits. Enteric contrast present in the colon. Redemonstrated postoperative findings status post  appendectomy. There has been interval removal of a surgical drain in the right lower quadrant. Previously seen small, rim enhancing fluid collection in the right lower quadrant is not significantly changed, measuring 1.5 cm (series 3, image 65). Vascular/Lymphatic: Aortic atherosclerosis. No enlarged abdominal or pelvic lymph nodes. Reproductive: No mass or other significant abnormality. Other: No abdominal wall hernia or abnormality. No abdominopelvic ascites. Musculoskeletal: No acute or significant osseous findings. Status post right hip total arthroplasty. IMPRESSION: 1. Interval placement of percutaneous pigtail drain catheter in the left upper quadrant within an air and fluid collection, the dominant component in which the catheter is placed measuring 7.9 x 6.6 cm, previously 8.5 x 6.2 cm. 2. Previously seen small, rim enhancing fluid collection in the right lower quadrant is not significantly changed, measuring 1.5 cm. 3. Redemonstrated postoperative findings status post appendectomy and splenectomy. There has been interval removal of a surgical drain in the right lower quadrant. 4. Cholelithiasis. Aortic Atherosclerosis (ICD10-I70.0). Electronically Signed   By: Delanna Ahmadi M.D.   On: 01/19/2021 12:32   DG Chest Port 1 View  Result Date: 01/21/2021 CLINICAL DATA:  Shortness of breath EXAM: PORTABLE CHEST 1 VIEW COMPARISON:  Two days ago FINDINGS: Low volume chest with artifact from EKG leads. Left PICC with tip at the upper right atrium. Left upper quadrant drain. Normal heart size and stable mediastinal contours. Unchanged haziness of the left more than right chest with atelectasis and pleural fluid. IMPRESSION: Stable atelectasis and pleural fluid asymmetric to the left. Electronically Signed   By: Jorje Guild M.D.   On: 01/21/2021 08:04    Anti-infectives: Anti-infectives (From admission, onward)    Start     Dose/Rate Route Frequency Ordered Stop   01/19/21 1200  cefTRIAXone (ROCEPHIN) 2  g in sodium chloride 0.9 % 100 mL IVPB        2 g 200 mL/hr over 30 Minutes Intravenous Every 24 hours 01/19/21 1056     01/19/21 1145  metroNIDAZOLE (FLAGYL) tablet 500 mg        500 mg Oral Every 12 hours 01/19/21 1056     01/18/21 0615  vancomycin (VANCOREADY) IVPB 1250 mg/250 mL  Status:  Discontinued        1,250 mg 166.7 mL/hr over 90 Minutes Intravenous Every 24 hours 01/18/21 0517 01/19/21 1400   01/17/21 1330  Ampicillin-Sulbactam (UNASYN) 3 g in sodium chloride 0.9 % 100 mL IVPB  Status:  Discontinued        3 g 200 mL/hr over 30 Minutes Intravenous Every 8 hours 01/17/21 1239 01/19/21 1056   01/09/21 1100  piperacillin-tazobactam (ZOSYN) IVPB 3.375 g  Status:  Discontinued        3.375 g 12.5 mL/hr over 240 Minutes Intravenous Every 8 hours 01/09/21 1000 01/12/21 0835   01/09/21 0830  ceFEPIme (MAXIPIME) 2 g in sodium chloride 0.9 % 100 mL IVPB  Status:  Discontinued  2 g 200 mL/hr over 30 Minutes Intravenous Every 12 hours 01/09/21 0737 01/09/21 1000   01/08/21 1400  metroNIDAZOLE (FLAGYL) IVPB 500 mg  Status:  Discontinued        500 mg 100 mL/hr over 60 Minutes Intravenous Every 12 hours 01/08/21 1059 01/09/21 1000   01/08/21 1400  ceFEPIme (MAXIPIME) 2 g in sodium chloride 0.9 % 100 mL IVPB  Status:  Discontinued        2 g 200 mL/hr over 30 Minutes Intravenous Every 24 hours 01/08/21 1114 01/09/21 0737   01/08/21 1000  vancomycin (VANCOREADY) IVPB 750 mg/150 mL  Status:  Discontinued        750 mg 150 mL/hr over 60 Minutes Intravenous Every 24 hours 01/07/21 2052 01/09/21 1000   01/08/21 0600  vancomycin (VANCOREADY) IVPB 1500 mg/300 mL  Status:  Discontinued        1,500 mg 150 mL/hr over 120 Minutes Intravenous Every 24 hours 01/07/21 0430 01/07/21 1210   01/08/21 0600  vancomycin (VANCOREADY) IVPB 750 mg/150 mL  Status:  Discontinued        750 mg 150 mL/hr over 60 Minutes Intravenous Every 24 hours 01/07/21 1210 01/07/21 2052   01/07/21 1400   piperacillin-tazobactam (ZOSYN) IVPB 3.375 g  Status:  Discontinued        3.375 g 12.5 mL/hr over 240 Minutes Intravenous Every 8 hours 01/07/21 0430 01/08/21 1114   01/07/21 0530  piperacillin-tazobactam (ZOSYN) IVPB 3.375 g        3.375 g 100 mL/hr over 30 Minutes Intravenous STAT 01/07/21 0430 01/07/21 0622   01/07/21 0530  vancomycin (VANCOREADY) IVPB 1750 mg/350 mL        1,750 mg 175 mL/hr over 120 Minutes Intravenous  Once 01/07/21 0430 01/07/21 0841   01/05/21 1430  Ampicillin-Sulbactam (UNASYN) 3 g in sodium chloride 0.9 % 100 mL IVPB        3 g 200 mL/hr over 30 Minutes Intravenous Every 6 hours 01/05/21 1334 01/06/21 1552   01/01/21 2200  piperacillin-tazobactam (ZOSYN) IVPB 3.375 g        3.375 g 12.5 mL/hr over 240 Minutes Intravenous Every 8 hours 01/01/21 1841 01/04/21 2359   01/01/21 2100  piperacillin-tazobactam (ZOSYN) IVPB 3.375 g  Status:  Discontinued        3.375 g 100 mL/hr over 30 Minutes Intravenous Every 8 hours 01/01/21 1836 01/01/21 1841   01/01/21 1345  piperacillin-tazobactam (ZOSYN) IVPB 3.375 g        3.375 g 100 mL/hr over 30 Minutes Intravenous  Once 01/01/21 1337 01/01/21 1430        Assessment/Plan POD 19, s/p exploratory laparotomy, ileocecectomy, drainage of intraabdominal abscess 01/01/21 Dr. Grandville Silos for perforated appendicitis with abscess - Drain removed 10/24 - VAC change M/W/F to midline - PT/OT rec SNF at discharge - on a diet and having bowel function but not eating. Continue TPN for now   POD 13, s/p ex lap, splenectomy, application of incisional wound vac 10/14. Dr. Bobbye Morton for splenic laceration - CT 10/24 with complex LUQ fluid collection - s/p IR drain 10/25 yielding dark bloody fluid, culture with E coli resistant to unasyn - switched to Rocephin/ flagyl today 10/27.  Also with staph bacteremia. Vancomycin added 10/26. - post-splenectomy vaccines given - WBC now normal. Temp overnight. Continue drain and antibiotics as above for  now. Palliative team is following. "Discussed with primary team 10/27 that if the patient does not start to make improvements over the next couple of  days he may transition to comfort care. "   FEN: TNA, HH/CM diet VTE: SQH  ID: Zosyn 10/9>> 10/20, unasyn 10/25>>10/27, rocephin/flagyl 10/27>>, vancomycin 10/26>>   Below per primary team: hemorrhagic shock 2/2 above s/p 6 u PRBC, 2 u FFP 10/14 Septic shock 2/2 perforated appendicitis  EtOH abuse Alcohol related cardiomyopathy T2DM Chronic anemia Hx of TIA HTN HLD Gout Hx of GIB CAD Tobacco abuse L pleural effusion s/p thoracentesis 10/25  LOS: 20 days   Nadeen Landau, MD [facility] Use [email or web address] to contact on call provider

## 2021-01-21 NOTE — Progress Notes (Signed)
   Palliative Medicine Inpatient Follow Up Note  Consulting Provider: Delilah Shan, RN   Reason for consult:   [place] Palliative Medicine Consult  Reason for Consult? Pt is needing increasing pressor/vent support, 2 major abdominal surgeries. RN and MD have spoken with family multiple times about comfort care but family is still having some trouble processing everything. Thank you!    HPI:  Per intake H&P --> [name]. [name] is a 65 year old gentleman with an extended PMH that includes but not limited to CHF, CKD 3A, COPD, CRA O, TIA, diabetes, hypertension, gout, hyperlipidemia, CAD (nonobstructive). He presented to the ED on 01/01/21 with complaints of right lower quadrant abdominal pain --> taken to the operating room where he was found to have a perforated appendix and abdominal abscess. 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 Now with rapidly worsening shock. Palliative care was asked to get involved to further address goals of care in the setting of overall critical illness and ongoing declining state.   Today's Discussion (01/21/2021):  *Please note that this is a verbal dictation therefore any spelling or grammatical errors are due to the "[employer]" system interpretation.  Chart reviewed.   I met with Travis [name] at bedside.  We reviewed that he appeared a lot more comfortable than he had on Wednesday.  He expresses that he thinks he has improved.  I asked him if he would like to get out of bed which he declined at this time.  Travis [name] and I discussed his plan after he leaves the acute care setting which will be for him to be in the long-term acute care hospital.  We discussed that they will provide a similar level of care as we do here.  He seemed to be in agreement with this and denied additional questions or concerns.  Objective Assessment: Vital Signs Vitals:   01/21/21 0811 01/21/21 1139  BP: 118/71 121/75   Pulse: 94 84  Resp: 19 16  Temp: 97.9 F (36.6 C) 98.4 F (36.9 C)  SpO2: 94% 95%    Intake/Output Summary (Last 24 hours) at 01/21/2021 1504 Last data filed at 01/21/2021 1143 Gross per 24 hour  Intake 1807.07 ml  Output 925 ml  Net 882.07 ml    Last Weight  Most recent update: 01/20/2021  6:40 AM    Weight  90.4 kg (199 lb 4.7 oz)            Gen: Older AA M in NAD HEENT: Dry mucous membranes CV: Regular rate and rhythm  PULM: On 2LPM Peculiar ABD: (+) abdominal dressings, (+) wound vac, (+) drain EXT: No edema  Neuro: Alert and oriented, able to converse  SUMMARY OF RECOMMENDATIONS   DNAR/DNI  MOST on chart  Plan for patient to transition ideally to [facility] as early as next week  Ongoing incremental support  Time Spent: 15 Greater than 50% of the time was spent in counseling and coordination of care _________________________________________________________________________________ [facility] Team Team Cell Phone: [phone] Please utilize secure chat with additional questions, if there is no response within 30 minutes please call the above phone number  Palliative Medicine Team providers are available by phone from 7am to 7pm daily and can be reached through the team cell phone.  Should this patient require assistance outside of these hours, please call the patient's attending physician.

## 2021-01-21 NOTE — Progress Notes (Signed)
PROGRESS NOTE        PATIENT DETAILS Name: Travis [name] Age: 65 y.o. Sex: male Date of Birth: [birth year] Admit Date: 01/01/2021 Admitting Physician Georganna Skeans, MD AGT:[id number], Alyson Locket, NP  Brief Narrative: Patient is a 65 y.o. male with history of HFrEF, CKD stage III, COPD, HTN, DM-2, HLD, nonobstructive CAD-who presented with abdominal pain-found to have sepsis from ruptured appendicitis-underwent exploratory laparotomy/ileocecectomy and drainage of intra-abdominal abscess.Patient was briefly managed in the ICU-unfortunately-on 10/14 he developed severe abdominal pain -he was then found to have hemorrhagic shock due to a spontaneous splenic rupture-he was reevaluated by general surgery and underwent a splenectomy.  He was readmitted to the ICU-remained on the ventilator-he self extubated on 10/16-he was monitored closely-upon further stability-transfer to the [facility] on 10/20.  Significant events: 10/09>>admit- ex lap, remained intubated post op-to ICU 10/10>>extubated 10/12>> transferred to [facility] 10/14>>hypotensive/abd pain-CT showed spleenc rupture-to OR for splenectomy. 10/16>> Self-extubated  10/18>> Increased O2 requirement to 8L 10/19>> O2 weaned to 4L 10/20>> transferred to [facility]    Radiology/Echo 10/09>> CT abdomen: Acute appendicitis with perforation 10/14>> CT abdomen: Splenic rupture with large splenic hematoma 10/15>> TTE: EF 45-50% 10/20>> CT abdomen/pelvis: Indeterminate 2.4 cm fluid collection in the right lower quadrant adjacent to the cecal resection site.  Moderate left pleural effusion with compressive atelectasis. 10/24 >> CT - 1. Splenectomy with organizing complex fluid collections in the left upper quadrant. The largest collection there is 162 mL. Smaller adjacent collections, some inseparable from the gastric serosa and with associated mass effect and architectural distortion on the stomach. Again favor  hematoma/seroma. Abscess is not excluded, but there is no gas within the collections. 2. Stable small discontiguous rim enhancing fluid collections in the lower small bowel mesentery at the pelvic inlet a short distance from the percutaneous drain. These have not significantly changed and collectively are less than 10 mL  10/27 >> CT - 1. Interval placement of percutaneous pigtail drain catheter in the left upper quadrant within an air and fluid collection, the dominant component in which the catheter is placed measuring 7.9 x 6.6 cm, previously 8.5 x 6.2 cm. 2. Previously seen small, rim enhancing fluid collection in the right lower quadrant is not significantly changed, measuring 1.5 cm. 3. Redemonstrated postoperative findings status post appendectomy and splenectomy. There has been interval removal of a surgical drain in the right lower quadrant. 4. Cholelithiasis   Subjective: Patient in bed, appears comfortable, denies any headache, no fever, no chest pain or pressure, no shortness of breath , no abdominal pain. No new focal weakness.    Objective: Vitals: Blood pressure 121/75, pulse 84, temperature 98.4 F (36.9 C), temperature source Oral, resp. rate 16, height 6' (1.829 m), weight 90.4 kg, SpO2 95 %.   Exam:  Awake Alert, No new F.N deficits,  [place].AT,PERRAL Supple Neck, No JVD,   Symmetrical Chest wall movement, Good air movement bilaterally, CTAB RRR,No Gallops, Rubs or new Murmurs,  +ve B.Sounds, W Vac and drain in place No Cyanosis, Clubbing or edema,        Assessment/Plan:   Sepsis due to acute appendicitis with perforation/intra-abdominal abscesses and Bacteroides bacteremia (s/p laparotomy/ileocecectomy and drainage of intra-abdominal abscess on 10/9) is finished Zosyn course for bacteroides, now growing E. coli from 01/17/2021 in the abscess: Sepsis physiology has resolved-Zosyn was discontinued on 01/12/21 .  On  PO liquid diet (poor intake) + TNA.  CT abdomen on  01/16/21 and 01/19/2021 noted -  he had started spiking fevers again on 01/16/2021, case was discussed with general surgery IR placed another drain in the left upper quadrant to drain residual fluid on 01/17/2021 with abscess fluid now growing E. coli sensitive to Rocephin, also underwent left-sided thoracentesis on 01/17/2021 draining 600 cc of exudative fluid likely sympathetic effusion with negative gram strain, 1 out of 2 positive blood cultures likely contamination from staph epidermis stop vancomycin, antibiotics currently Rocephin and Flagyl combination in the light of present abscess cultures positive for E. Coli, clinically better on 01/20/21 - monitor, if any further decline then we will transition to full comfort.  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    Acute hypoxic respiratory failure due to atelectasis/pleural effusion: Self extubated on 10/16-stable on anywhere from 2-4 L of oxygen-did develop some transient shortness of breath on 10/20-which responded to bronchodilators/diuretics.    Patient appears to have a moderate-sized left-sided pleural effusion which is likely transudative from-HFrEF/reactive to splenectomy.  PRN Lasix, also IR to do ultrasound-guided thoracentesis on 01/17/2021 I do not expect this to be parapneumonic or infectious .   Acute metabolic encephalopathy: Rapidly improving-continue supportive care.  AKI on CKD stage IIIa: AKI hemodynamically mediated-hold Lasix, post hydration and packed RBC transfusion, improved.  HTN: BP stable-continue Coreg-losartan remains on hold  Nonischemic cardiomyopathy/HFrEF: Not in exacerbation-continue Lasix and monitor volume status closely.  Follow electrolytes.   Nonobstructive CAD: No anginal symptoms-already on beta-blocker-we will resume aspirin/statin over the next few days when more  stable.  DM-2 (A1c 6.4 on 06/02/2020): CBGs stable-continue SSI  Recent Labs    01/21/21 0405 01/21/21 0600 01/21/21 1146  GLUCAP 168* 157* 175*     Anemia of chronic disease and acute disease.  1 unit of packed RBC transfusion on 01/18/2021, no signs of active bleeding, stable posttransfusion H&H.  HLD: Resume statin/fenofibrate when oral intake was stable.  COPD: Stable-continue bronchodilators  Functional quadriplegia/debility/deconditioning: Due to acute illness-continue PT/OT eval-either SNF or LTAC on discharge.  Other issues: Has subclavian TLC since 10/14-this was removed on 10/21-he now has a PICC line through which TNA is infusing.  Nutrition Status: Nutrition Problem: Moderate Malnutrition Etiology: acute illness, chronic illness (perforated appendix, CAD, cardiomyopathy) Signs/Symptoms: mild muscle depletion, mild fat depletion Interventions: Ensure Enlive (each supplement provides 350kcal and 20 grams of protein), TPN  Obesity: Estimated body mass index is 27.03 kg/m as calculated from the following:   Height as of this encounter: 6' (1.829 m).   Weight as of this encounter: 90.4 kg.    Procedures: 10/9>>exploratory laparotomy, ileocecectomy, drainage of intraabdominal abscess  10/14>> exploratory laparotomy and splenectomy 10/14>> left subclavian triple-lumen catheter 01/17/2021  >> left ultrasound-guided thoracentesis 600 cc exudative fluid, gram stain -ve 01/17/2021  >> LUQ JP drain by IR   Consults: General surgery, PCCM DVT Prophylaxis: Heparin Code Status:DNR Family Communication: Sister-in-law at bedside on 01/15/21.  Niece [name] [phone] over the phone on 01/17/2021, 01/20/21     Time spent: 35 minutes-Greater than 50% of this time was spent in counseling, explanation of diagnosis, planning of further management, and coordination of care.  Diet: Diet Order             Diet regular Room service appropriate? Yes; Fluid consistency: Thin   Diet effective now  Disposition Plan: Status is: Inpatient  Remains inpatient appropriate because: Requires inpatient level of care.   Barriers to Discharge: Perforated appendix with pelvic abscess/splenic rupture-s/p laparotomy/splenectomy-not yet stable for discharge.     MEDICATIONS: Scheduled Meds:  acetaminophen  1,000 mg Oral Q6H   Chlorhexidine Gluconate Cloth  6 each Topical Daily   docusate sodium  100 mg Oral BID   feeding supplement  237 mL Oral TID BM   heparin injection (subcutaneous)  5,000 Units Subcutaneous Q8H   insulin aspart  0-20 Units Subcutaneous Q6H   mouth rinse  15 mL Mouth Rinse BID   methocarbamol  500 mg Oral QID   metoprolol tartrate  25 mg Oral BID   metroNIDAZOLE  500 mg Oral Q12H   mirtazapine  7.5 mg Oral QHS   oxyCODONE  20 mg Oral Q12H   pantoprazole sodium  40 mg Oral Daily   polyethylene glycol  17 g Oral Daily   Continuous Infusions:  cefTRIAXone (ROCEPHIN)  IV 2 g (01/21/21 1235)   TPN ADULT (ION)     PRN Meds:.bisacodyl, dextromethorphan, HYDROmorphone (DILAUDID) injection, hydroxypropyl methylcellulose / hypromellose, ipratropium-albuterol, ondansetron (ZOFRAN) IV, oxyCODONE, phenol, sodium chloride, sodium chloride flush   I have personally reviewed following labs and imaging studies  LABORATORY DATA:  Recent Labs  Lab 01/17/21 0424 01/18/21 0359 01/18/21 2000 01/19/21 0226 01/20/21 0453 01/21/21 0131  WBC 18.6* 14.9*  --  13.0* 12.8* 10.4  HGB 7.4* 7.0* 8.1* 7.7* 8.0* 8.6*  HCT 23.7* 22.4* 25.4* 24.3* 25.0* 28.0*  PLT 584* 562*  --  523* 530* 552*  MCV 96.0 96.6  --  92.4 91.9 94.0  MCH 30.0 30.2  --  29.3 29.4 28.9  MCHC 31.2 31.3  --  31.7 32.0 30.7  RDW 16.1* 16.3*  --  18.4* 17.5* 17.2*    Recent Labs  Lab 01/16/21 0522 01/16/21 0536 01/17/21 0424 01/18/21 0359 01/19/21 0226 01/20/21 0453 01/21/21 0131  NA 136  --  137 139 139 139 137  K 4.0  --  4.8 4.6 4.2 3.9 4.1  CL 97*   --  99 104 105 104 104  CO2 29  --  26 27 27 27 26   GLUCOSE 201*  --  205* 166* 191* 124* 175*  BUN 43*  --  52* 42* 34* 28* 25*  CREATININE 1.43*  --  1.80* 1.58* 1.46* 1.20 1.11  CALCIUM 8.9  --  9.2 9.0 9.1 9.2 9.3  AST 27  --  44* 40 45* 41 28  ALT 25  --  36 37 42 42 34  ALKPHOS 171*  --  230* 230* 226* 241* 221*  BILITOT 1.1  --  1.1 1.2 1.3* 1.0 0.8  ALBUMIN 1.7*  --  1.8* <1.5* 1.5* 1.6* 1.6*  MG 2.1  --   --   --  2.2  --   --   PROCALCITON 0.65  --  0.80 0.57 0.41 0.34 0.26  LATICACIDVEN  --  0.9 1.5  --   --   --   --   BNP 61.3  --  74.1 212.5* 177.6* 185.7* 124.9*     RADIOLOGY STUDIES/RESULTS: DG Chest Port 1 View  Result Date: 01/21/2021 CLINICAL DATA:  Shortness of breath EXAM: PORTABLE CHEST 1 VIEW COMPARISON:  Two days ago FINDINGS: Low volume chest with artifact from EKG leads. Left PICC with tip at the upper right atrium. Left upper quadrant drain. Normal heart size and stable mediastinal contours. Unchanged haziness of  the left more than right chest with atelectasis and pleural fluid. IMPRESSION: Stable atelectasis and pleural fluid asymmetric to the left. Electronically Signed   By: Jorje Guild M.D.   On: 01/21/2021 08:04     LOS: 20 days   Signature  Lala Lund M.D on 01/21/2021 at 1:31 PM   -  To page go to [email or web address]

## 2021-01-22 DIAGNOSIS — Z515 Encounter for palliative care: Secondary | ICD-10-CM | POA: Diagnosis not present

## 2021-01-22 LAB — COMPREHENSIVE METABOLIC PANEL
ALT: 26 U/L (ref 0–44)
AST: 22 U/L (ref 15–41)
Albumin: 1.5 g/dL — ABNORMAL LOW (ref 3.5–5.0)
Alkaline Phosphatase: 181 U/L — ABNORMAL HIGH (ref 38–126)
Anion gap: 7 (ref 5–15)
BUN: 24 mg/dL — ABNORMAL HIGH (ref 8–23)
CO2: 26 mmol/L (ref 22–32)
Calcium: 9.4 mg/dL (ref 8.9–10.3)
Chloride: 106 mmol/L (ref 98–111)
Creatinine, Ser: 0.99 mg/dL (ref 0.61–1.24)
GFR, Estimated: >60 mL/min (ref >60)
Glucose, Bld: 180 mg/dL — ABNORMAL HIGH (ref 70–99)
Potassium: 3.8 mmol/L (ref 3.5–5.1)
Sodium: 139 mmol/L (ref 135–145)
Total Bilirubin: 0.8 mg/dL (ref 0.3–1.2)
Total Protein: 6.4 g/dL — ABNORMAL LOW (ref 6.5–8.1)

## 2021-01-22 LAB — CULTURE, BODY FLUID W GRAM STAIN -BOTTLE: Culture: NO GROWTH

## 2021-01-22 LAB — GLUCOSE, CAPILLARY
Glucose-Capillary: 183 mg/dL — ABNORMAL HIGH (ref 70–99)
Glucose-Capillary: 189 mg/dL — ABNORMAL HIGH (ref 70–99)
Glucose-Capillary: 192 mg/dL — ABNORMAL HIGH (ref 70–99)
Glucose-Capillary: 192 mg/dL — ABNORMAL HIGH (ref 70–99)
Glucose-Capillary: 198 mg/dL — ABNORMAL HIGH (ref 70–99)

## 2021-01-22 LAB — CULTURE, BLOOD (ROUTINE X 2)
Culture: NO GROWTH
Special Requests: ADEQUATE

## 2021-01-22 LAB — PROCALCITONIN: Procalcitonin: 0.21 ng/mL

## 2021-01-22 LAB — CBC
HCT: 25.5 % — ABNORMAL LOW (ref 39.0–52.0)
Hemoglobin: 8 g/dL — ABNORMAL LOW (ref 13.0–17.0)
MCH: 29.7 pg (ref 26.0–34.0)
MCHC: 31.4 g/dL (ref 30.0–36.0)
MCV: 94.8 fL (ref 80.0–100.0)
Platelets: 522 10*3/uL — ABNORMAL HIGH (ref 150–400)
RBC: 2.69 MIL/uL — ABNORMAL LOW (ref 4.22–5.81)
RDW: 17.1 % — ABNORMAL HIGH (ref 11.5–15.5)
WBC: 10.9 10*3/uL — ABNORMAL HIGH (ref 4.0–10.5)
nRBC: 1 % — ABNORMAL HIGH (ref 0.0–0.2)

## 2021-01-22 LAB — BRAIN NATRIURETIC PEPTIDE: B Natriuretic Peptide: 77.8 pg/mL (ref 0.0–100.0)

## 2021-01-22 MED ORDER — SIMETHICONE 80 MG PO CHEW
80.0000 mg | CHEWABLE_TABLET | Freq: Four times a day (QID) | ORAL | Status: DC | PRN
  Administered 2021-01-22 – 2021-02-06 (×2): 80 mg via ORAL
  Filled 2021-01-22 (×3): qty 1

## 2021-01-22 MED ORDER — HYDROMORPHONE HCL 1 MG/ML IJ SOLN
0.5000 mg | Freq: Once | INTRAMUSCULAR | Status: DC
Start: 2021-01-23 — End: 2021-02-09

## 2021-01-22 MED ORDER — PANTOPRAZOLE SODIUM 40 MG PO TBEC
40.0000 mg | DELAYED_RELEASE_TABLET | Freq: Every day | ORAL | Status: DC
  Administered 2021-01-23 – 2021-02-09 (×18): 40 mg via ORAL
  Filled 2021-01-22 (×18): qty 1

## 2021-01-22 MED ORDER — PROSOURCE PLUS PO LIQD
30.0000 mL | Freq: Three times a day (TID) | ORAL | Status: DC
  Administered 2021-01-22 – 2021-01-31 (×18): 30 mL via ORAL
  Filled 2021-01-22 (×25): qty 30

## 2021-01-22 NOTE — Progress Notes (Signed)
PROGRESS NOTE        PATIENT DETAILS Name: Travis [name] Age: 65 y.o. Sex: male Date of Birth: [birth year] Admit Date: 01/01/2021 Admitting Physician Georganna Skeans, MD GBT:DVVOH, Alyson Locket, NP  Brief Narrative: Patient is a 65 y.o. male with history of HFrEF, CKD stage III, COPD, HTN, DM-2, HLD, nonobstructive CAD-who presented with abdominal pain-found to have sepsis from ruptured appendicitis-underwent exploratory laparotomy/ileocecectomy and drainage of intra-abdominal abscess.Patient was briefly managed in the ICU-unfortunately-on 10/14 he developed severe abdominal pain -he was then found to have hemorrhagic shock due to a spontaneous splenic rupture-he was reevaluated by general surgery and underwent a splenectomy.  He was readmitted to the ICU-remained on the ventilator-he self extubated on 10/16-he was monitored closely-upon further stability-transfer to the [facility] on 10/20.  Significant events: 10/09>>admit- ex lap, remained intubated post op-to ICU 10/10>>extubated 10/12>> transferred to [facility] 10/14>>hypotensive/abd pain-CT showed spleenc rupture-to OR for splenectomy. 10/16>> Self-extubated  10/18>> Increased O2 requirement to 8L 10/19>> O2 weaned to 4L 10/20>> transferred to [facility]    Radiology/Echo 10/09>> CT abdomen: Acute appendicitis with perforation 10/14>> CT abdomen: Splenic rupture with large splenic hematoma 10/15>> TTE: EF 45-50% 10/20>> CT abdomen/pelvis: Indeterminate 2.4 cm fluid collection in the right lower quadrant adjacent to the cecal resection site.  Moderate left pleural effusion with compressive atelectasis. 10/24 >> CT - 1. Splenectomy with organizing complex fluid collections in the left upper quadrant. The largest collection there is 162 mL. Smaller adjacent collections, some inseparable from the gastric serosa and with associated mass effect and architectural distortion on the stomach. Again favor  hematoma/seroma. Abscess is not excluded, but there is no gas within the collections. 2. Stable small discontiguous rim enhancing fluid collections in the lower small bowel mesentery at the pelvic inlet a short distance from the percutaneous drain. These have not significantly changed and collectively are less than 10 mL  10/27 >> CT - 1. Interval placement of percutaneous pigtail drain catheter in the left upper quadrant within an air and fluid collection, the dominant component in which the catheter is placed measuring 7.9 x 6.6 cm, previously 8.5 x 6.2 cm. 2. Previously seen small, rim enhancing fluid collection in the right lower quadrant is not significantly changed, measuring 1.5 cm. 3. Redemonstrated postoperative findings status post appendectomy and splenectomy. There has been interval removal of a surgical drain in the right lower quadrant. 4. Cholelithiasis   Subjective: Patient in bed, appears comfortable, denies any headache, no fever, no chest pain or pressure, no shortness of breath , no abdominal pain. No new focal weakness.     Objective: Vitals: Blood pressure 134/80, pulse (!) 103, temperature 98 F (36.7 C), temperature source Oral, resp. rate 19, height 6' (1.829 m), weight 90.4 kg, SpO2 93 %.   Exam:  Awake Alert, No new F.N deficits, Normal affect [place].AT,PERRAL Supple Neck, No JVD,   Symmetrical Chest wall movement, Good air movement bilaterally, CTAB RRR,No Gallops, Rubs or new Murmurs,  +ve B.Sounds, W Vac and LUQ drain in place No Cyanosis, Clubbing or edema     Assessment/Plan:   Sepsis due to acute appendicitis with perforation/intra-abdominal abscesses and Bacteroides bacteremia (s/p laparotomy/ileocecectomy and drainage of intra-abdominal abscess on 10/9) is finished Zosyn course for bacteroides, now growing E. coli from 01/17/2021 in the abscess: Sepsis physiology has resolved -   postop he did  well and antibiotics were stopped however on 01/16/2021 he  started spiking fevers again and IR was consulted, CT scan was repeated, per general surgery he had another left upper quadrant drain placed by IR to drain questionable fluid collection.  Fluid growing E. coli.  1 out of 2 blood cultures contaminant with staph epidermis.  He is currently on Rocephin and Flagyl combination clinically improved, TNA will be stopped on 01/22/2021.  Continue oral diet.  General surgery following.  If any further decline per family and patient transition to full comfort care.   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  Acute hypoxic respiratory failure due to atelectasis/pleural effusion: Self extubated on 10/16-stable on anywhere from 2-4 L of oxygen-did develop some transient shortness of breath on 10/20-which responded to bronchodilators/diuretics.    Patient appears to have a moderate-sized left-sided pleural effusion which is likely transudative from-HFrEF/reactive to splenectomy.  PRN Lasix, also IR did ultrasound-guided thoracentesis on 01/17/2021 which was consistent with sympathetic exudative effusion gram stain negative cultures negative  Acute metabolic encephalopathy: Rapidly improving-continue supportive care.  AKI on CKD stage IIIa: AKI hemodynamically mediated-hold Lasix, post hydration and packed RBC transfusion, improved.  HTN: BP stable-continue Coreg-losartan remains on hold  Nonischemic cardiomyopathy/HFrEF: Not in exacerbation-continue Lasix and monitor volume status closely.  Follow electrolytes.   Nonobstructive CAD: No anginal symptoms-already on beta-blocker-we will resume aspirin/statin over the next few days when more stable.  DM-2 (A1c 6.4 on 06/02/2020): CBGs stable-continue SSI  Recent Labs    01/21/21 1743 01/22/21 0022 01/22/21 0548  GLUCAP 171* 192* 192*     Anemia of chronic disease and acute disease.  1 unit of  packed RBC transfusion on 01/18/2021, no signs of active bleeding, stable posttransfusion H&H.  HLD: Resume statin/fenofibrate when oral intake was stable.  COPD: Stable-continue bronchodilators  Functional quadriplegia/debility/deconditioning: Due to acute illness-continue PT/OT eval-either SNF or LTAC on discharge.  Other issues: Has subclavian TLC since 10/14-this was removed on 10/21-he now has a PICC line through which TNA is infusing.  Nutrition Status: Nutrition Problem: Moderate Malnutrition Etiology: acute illness, chronic illness (perforated appendix, CAD, cardiomyopathy) Signs/Symptoms: mild muscle depletion, mild fat depletion Interventions: Ensure Enlive (each supplement provides 350kcal and 20 grams of protein), TPN  Obesity: Estimated body mass index is 27.03 kg/m as calculated from the following:   Height as of this encounter: 6' (1.829 m).   Weight as of this encounter: 90.4 kg.    Procedures: 10/9>>exploratory laparotomy, ileocecectomy, drainage of intraabdominal abscess  10/14>> exploratory laparotomy and splenectomy 10/14>> left subclavian triple-lumen catheter 01/17/2021  >> left ultrasound-guided thoracentesis 600 cc exudative fluid, gram stain -ve 01/17/2021  >> LUQ JP drain by IR   Consults: General surgery, PCCM DVT Prophylaxis: Heparin Code Status:DNR Family Communication: Sister-in-law at bedside on 01/15/21.  Niece [name] [phone] over the phone on 01/17/2021, 01/20/21     Time spent: 35 minutes-Greater than 50% of this time was spent in counseling, explanation of diagnosis, planning of further management, and coordination of care.  Diet: Diet Order             Diet regular Room service appropriate? Yes; Fluid consistency: Thin  Diet effective now                    Disposition Plan: Status is: Inpatient  Remains inpatient appropriate because: Requires inpatient level of care.   Barriers to Discharge: Perforated appendix  with  pelvic abscess/splenic rupture-s/p laparotomy/splenectomy-not yet stable for discharge.     MEDICATIONS: Scheduled Meds:  (feeding supplement) PROSource Plus  30 mL Oral TID BM   acetaminophen  1,000 mg Oral Q6H   Chlorhexidine Gluconate Cloth  6 each Topical Daily   docusate sodium  100 mg Oral BID   feeding supplement  237 mL Oral TID BM   heparin injection (subcutaneous)  5,000 Units Subcutaneous Q8H   insulin aspart  0-20 Units Subcutaneous Q6H   mouth rinse  15 mL Mouth Rinse BID   methocarbamol  500 mg Oral QID   metoprolol tartrate  25 mg Oral BID   metroNIDAZOLE  500 mg Oral Q12H   mirtazapine  7.5 mg Oral QHS   oxyCODONE  20 mg Oral Q12H   [START ON 01/23/2021] pantoprazole  40 mg Oral Daily   polyethylene glycol  17 g Oral Daily   Continuous Infusions:  cefTRIAXone (ROCEPHIN)  IV 2 g (01/21/21 1235)   PRN Meds:.bisacodyl, dextromethorphan, HYDROmorphone (DILAUDID) injection, hydroxypropyl methylcellulose / hypromellose, ipratropium-albuterol, ondansetron (ZOFRAN) IV, oxyCODONE, phenol, sodium chloride, sodium chloride flush   I have personally reviewed following labs and imaging studies  LABORATORY DATA:  Recent Labs  Lab 01/18/21 0359 01/18/21 2000 01/19/21 0226 01/20/21 0453 01/21/21 0131 01/22/21 0325  WBC 14.9*  --  13.0* 12.8* 10.4 10.9*  HGB 7.0* 8.1* 7.7* 8.0* 8.6* 8.0*  HCT 22.4* 25.4* 24.3* 25.0* 28.0* 25.5*  PLT 562*  --  523* 530* 552* 522*  MCV 96.6  --  92.4 91.9 94.0 94.8  MCH 30.2  --  29.3 29.4 28.9 29.7  MCHC 31.3  --  31.7 32.0 30.7 31.4  RDW 16.3*  --  18.4* 17.5* 17.2* 17.1*    Recent Labs  Lab 01/16/21 0522 01/16/21 0536 01/17/21 0424 01/18/21 0359 01/19/21 0226 01/20/21 0453 01/21/21 0131 01/22/21 0325  NA 136  --  137 139 139 139 137 139  K 4.0  --  4.8 4.6 4.2 3.9 4.1 3.8  CL 97*  --  99 104 105 104 104 106  CO2 29  --  26 27 27 27 26 26   GLUCOSE 201*  --  205* 166* 191* 124* 175* 180*  BUN 43*  --  52* 42* 34* 28*  25* 24*  CREATININE 1.43*  --  1.80* 1.58* 1.46* 1.20 1.11 0.99  CALCIUM 8.9  --  9.2 9.0 9.1 9.2 9.3 9.4  AST 27  --  44* 40 45* 41 28 22  ALT 25  --  36 37 42 42 34 26  ALKPHOS 171*  --  230* 230* 226* 241* 221* 181*  BILITOT 1.1  --  1.1 1.2 1.3* 1.0 0.8 0.8  ALBUMIN 1.7*  --  1.8* <1.5* 1.5* 1.6* 1.6* 1.5*  MG 2.1  --   --   --  2.2  --   --   --   PROCALCITON 0.65  --  0.80 0.57 0.41 0.34 0.26 0.21  LATICACIDVEN  --  0.9 1.5  --   --   --   --   --   BNP 61.3  --  74.1 212.5* 177.6* 185.7* 124.9* 77.8     RADIOLOGY STUDIES/RESULTS: DG Chest Port 1 View  Result Date: 01/21/2021 CLINICAL DATA:  Shortness of breath EXAM: PORTABLE CHEST 1 VIEW COMPARISON:  Two days ago FINDINGS: Low volume chest with artifact from EKG leads. Left PICC with tip at the upper right atrium. Left upper quadrant drain. Normal  heart size and stable mediastinal contours. Unchanged haziness of the left more than right chest with atelectasis and pleural fluid. IMPRESSION: Stable atelectasis and pleural fluid asymmetric to the left. Electronically Signed   By: Jorje Guild M.D.   On: 01/21/2021 08:04     LOS: 21 days   Signature  Lala Lund M.D on 01/22/2021 at 10:16 AM   -  To page go to [email or web address]

## 2021-01-22 NOTE — Progress Notes (Signed)
Progress Note  16 Days Post-Op  Subjective: Patient states he is feeling well. Denies nausea or vomiting. Hasn't taken a lot by mouth yet though. Unsure of when last BM was. He reports he is eating about 1/4 of his trays of food  Objective: Vital signs in last 24 hours: Temp:  [98 F (36.7 C)-99.4 F (37.4 C)] 98 F (36.7 C) (10/30 0811) Pulse Rate:  [84-103] 103 (10/30 0811) Resp:  [16-20] 19 (10/30 0811) BP: (116-140)/(71-90) 134/80 (10/30 0811) SpO2:  [93 %-97 %] 93 % (10/30 0811) Last BM Date: 01/20/21  Intake/Output from previous day: 10/29 0701 - 10/30 0700 In: 2059.8 [P.O.:760; I.V.:1099.8; IV Piggyback:200] Out: 1532 [Urine:1475; Drains:57] Intake/Output this shift: Total I/O In: -  Out: 450 [Urine:450]  PE: Gen:  Alert, NAD, pleasant Card:  rrr Pulm:  comfortable unlabored breathing Abd: Soft, mild distension, moderate right sided abdominal TTP, no left sided tenderness and no diffuse peritonitis, vac to midline incision with good seal, LUQ drain with dark bloody fluid   Lab Results:  Recent Labs    01/21/21 0131 01/22/21 0325  WBC 10.4 10.9*  HGB 8.6* 8.0*  HCT 28.0* 25.5*  PLT 552* 522*   BMET Recent Labs    01/21/21 0131 01/22/21 0325  NA 137 139  K 4.1 3.8  CL 104 106  CO2 26 26  GLUCOSE 175* 180*  BUN 25* 24*  CREATININE 1.11 0.99  CALCIUM 9.3 9.4   PT/INR No results for input(s): LABPROT, INR in the last 72 hours. CMP     Component Value Date/Time   NA 139 01/22/2021 0325   NA 138 06/20/2020 1034   NA 141 04/20/2014 1019   K 3.8 01/22/2021 0325   K 3.7 04/20/2014 1019   CL 106 01/22/2021 0325   CL 106 04/20/2014 1019   CO2 26 01/22/2021 0325   CO2 30 04/20/2014 1019   GLUCOSE 180 (H) 01/22/2021 0325   GLUCOSE 102 (H) 04/20/2014 1019   BUN 24 (H) 01/22/2021 0325   BUN 18 06/20/2020 1034   BUN 14 04/20/2014 1019   CREATININE 0.99 01/22/2021 0325   CREATININE 1.18 04/20/2014 1019   CALCIUM 9.4 01/22/2021 0325   CALCIUM  9.3 04/20/2014 1019   PROT 6.4 (L) 01/22/2021 0325   PROT 6.5 06/18/2017 1425   PROT 7.4 04/20/2014 1019   ALBUMIN 1.5 (L) 01/22/2021 0325   ALBUMIN 4.0 06/18/2017 1425   ALBUMIN 3.4 04/20/2014 1019   AST 22 01/22/2021 0325   AST 21 04/20/2014 1019   ALT 26 01/22/2021 0325   ALT 20 04/20/2014 1019   ALKPHOS 181 (H) 01/22/2021 0325   ALKPHOS 97 04/20/2014 1019   BILITOT 0.8 01/22/2021 0325   BILITOT 0.4 06/18/2017 1425   BILITOT 0.6 04/20/2014 1019   GFRNONAA >60 01/22/2021 0325   GFRNONAA >60 04/20/2014 1019   GFRNONAA >60 11/19/2012 1107   GFRAA 53 (L) 01/13/2020 1410   GFRAA >60 04/20/2014 1019   GFRAA >60 11/19/2012 1107   Lipase     Component Value Date/Time   LIPASE 31 01/01/2021 1023   LIPASE 112 04/20/2014 1019       Studies/Results: DG Chest Port 1 View  Result Date: 01/21/2021 CLINICAL DATA:  Shortness of breath EXAM: PORTABLE CHEST 1 VIEW COMPARISON:  Two days ago FINDINGS: Low volume chest with artifact from EKG leads. Left PICC with tip at the upper right atrium. Left upper quadrant drain. Normal heart size and stable mediastinal contours. Unchanged haziness of the  left more than right chest with atelectasis and pleural fluid. IMPRESSION: Stable atelectasis and pleural fluid asymmetric to the left. Electronically Signed   By: Jorje Guild M.D.   On: 01/21/2021 08:04    Anti-infectives: Anti-infectives (From admission, onward)    Start     Dose/Rate Route Frequency Ordered Stop   01/19/21 1200  cefTRIAXone (ROCEPHIN) 2 g in sodium chloride 0.9 % 100 mL IVPB        2 g 200 mL/hr over 30 Minutes Intravenous Every 24 hours 01/19/21 1056     01/19/21 1145  metroNIDAZOLE (FLAGYL) tablet 500 mg        500 mg Oral Every 12 hours 01/19/21 1056     01/18/21 0615  vancomycin (VANCOREADY) IVPB 1250 mg/250 mL  Status:  Discontinued        1,250 mg 166.7 mL/hr over 90 Minutes Intravenous Every 24 hours 01/18/21 0517 01/19/21 1400   01/17/21 1330   Ampicillin-Sulbactam (UNASYN) 3 g in sodium chloride 0.9 % 100 mL IVPB  Status:  Discontinued        3 g 200 mL/hr over 30 Minutes Intravenous Every 8 hours 01/17/21 1239 01/19/21 1056   01/09/21 1100  piperacillin-tazobactam (ZOSYN) IVPB 3.375 g  Status:  Discontinued        3.375 g 12.5 mL/hr over 240 Minutes Intravenous Every 8 hours 01/09/21 1000 01/12/21 0835   01/09/21 0830  ceFEPIme (MAXIPIME) 2 g in sodium chloride 0.9 % 100 mL IVPB  Status:  Discontinued        2 g 200 mL/hr over 30 Minutes Intravenous Every 12 hours 01/09/21 0737 01/09/21 1000   01/08/21 1400  metroNIDAZOLE (FLAGYL) IVPB 500 mg  Status:  Discontinued        500 mg 100 mL/hr over 60 Minutes Intravenous Every 12 hours 01/08/21 1059 01/09/21 1000   01/08/21 1400  ceFEPIme (MAXIPIME) 2 g in sodium chloride 0.9 % 100 mL IVPB  Status:  Discontinued        2 g 200 mL/hr over 30 Minutes Intravenous Every 24 hours 01/08/21 1114 01/09/21 0737   01/08/21 1000  vancomycin (VANCOREADY) IVPB 750 mg/150 mL  Status:  Discontinued        750 mg 150 mL/hr over 60 Minutes Intravenous Every 24 hours 01/07/21 2052 01/09/21 1000   01/08/21 0600  vancomycin (VANCOREADY) IVPB 1500 mg/300 mL  Status:  Discontinued        1,500 mg 150 mL/hr over 120 Minutes Intravenous Every 24 hours 01/07/21 0430 01/07/21 1210   01/08/21 0600  vancomycin (VANCOREADY) IVPB 750 mg/150 mL  Status:  Discontinued        750 mg 150 mL/hr over 60 Minutes Intravenous Every 24 hours 01/07/21 1210 01/07/21 2052   01/07/21 1400  piperacillin-tazobactam (ZOSYN) IVPB 3.375 g  Status:  Discontinued        3.375 g 12.5 mL/hr over 240 Minutes Intravenous Every 8 hours 01/07/21 0430 01/08/21 1114   01/07/21 0530  piperacillin-tazobactam (ZOSYN) IVPB 3.375 g        3.375 g 100 mL/hr over 30 Minutes Intravenous STAT 01/07/21 0430 01/07/21 0622   01/07/21 0530  vancomycin (VANCOREADY) IVPB 1750 mg/350 mL        1,750 mg 175 mL/hr over 120 Minutes Intravenous  Once  01/07/21 0430 01/07/21 0841   01/05/21 1430  Ampicillin-Sulbactam (UNASYN) 3 g in sodium chloride 0.9 % 100 mL IVPB        3 g 200 mL/hr over 30  Minutes Intravenous Every 6 hours 01/05/21 1334 01/06/21 1552   01/01/21 2200  piperacillin-tazobactam (ZOSYN) IVPB 3.375 g        3.375 g 12.5 mL/hr over 240 Minutes Intravenous Every 8 hours 01/01/21 1841 01/04/21 2359   01/01/21 2100  piperacillin-tazobactam (ZOSYN) IVPB 3.375 g  Status:  Discontinued        3.375 g 100 mL/hr over 30 Minutes Intravenous Every 8 hours 01/01/21 1836 01/01/21 1841   01/01/21 1345  piperacillin-tazobactam (ZOSYN) IVPB 3.375 g        3.375 g 100 mL/hr over 30 Minutes Intravenous  Once 01/01/21 1337 01/01/21 1430        Assessment/Plan POD 19, s/p exploratory laparotomy, ileocecectomy, drainage of intraabdominal abscess 01/01/21 Dr. Grandville Silos for perforated appendicitis with abscess - Drain removed 10/24 - VAC change M/W/F to midline - PT/OT rec SNF at discharge - noted plans for possible [facility] this week - on a diet and having bowel function but not eating much - 1/4 of his tray. Continue TPN for now   POD 14, s/p ex lap, splenectomy, application of incisional wound vac 10/14. Dr. Bobbye Morton for splenic laceration - CT 10/24 with complex LUQ fluid collection - s/p IR drain 10/25 yielding dark bloody fluid, culture with E coli resistant to unasyn - switched to Rocephin/ flagyl today 10/27.  Also with staph bacteremia. Vancomycin added 10/26. - post-splenectomy vaccines given - WBC normal. Continue drain and antibiotics as above for now. Palliative team is following. "Discussed with primary team 10/27 that if the patient does not start to make improvements over the next couple of days he may transition to comfort care. "   FEN: TNA, HH/CM diet VTE: [facility]  ID: Zosyn 10/9>> 10/20, unasyn 10/25>>10/27, rocephin/flagyl 10/27>>, vancomycin 10/26>>   Below per primary team: hemorrhagic shock 2/2 above s/p 6 u PRBC, 2  u FFP 10/14 Septic shock 2/2 perforated appendicitis  EtOH abuse Alcohol related cardiomyopathy T2DM Chronic anemia Hx of TIA HTN HLD Gout Hx of GIB CAD Tobacco abuse L pleural effusion s/p thoracentesis 10/25   LOS: 21 days   Nadeen Landau, MD [facility] Surgery Use [email or web address] to contact on call provider

## 2021-01-23 ENCOUNTER — Inpatient Hospital Stay (HOSPITAL_COMMUNITY): Payer: Medicare HMO

## 2021-01-23 ENCOUNTER — Ambulatory Visit: Payer: Medicare HMO | Admitting: Family

## 2021-01-23 DIAGNOSIS — Z515 Encounter for palliative care: Secondary | ICD-10-CM | POA: Diagnosis not present

## 2021-01-23 LAB — COMPREHENSIVE METABOLIC PANEL
ALT: 27 U/L (ref 0–44)
AST: 23 U/L (ref 15–41)
Albumin: 1.6 g/dL — ABNORMAL LOW (ref 3.5–5.0)
Alkaline Phosphatase: 185 U/L — ABNORMAL HIGH (ref 38–126)
Anion gap: 6 (ref 5–15)
BUN: 24 mg/dL — ABNORMAL HIGH (ref 8–23)
CO2: 26 mmol/L (ref 22–32)
Calcium: 9.5 mg/dL (ref 8.9–10.3)
Chloride: 105 mmol/L (ref 98–111)
Creatinine, Ser: 0.99 mg/dL (ref 0.61–1.24)
GFR, Estimated: >60 mL/min (ref >60)
Glucose, Bld: 158 mg/dL — ABNORMAL HIGH (ref 70–99)
Potassium: 4.2 mmol/L (ref 3.5–5.1)
Sodium: 137 mmol/L (ref 135–145)
Total Bilirubin: 0.7 mg/dL (ref 0.3–1.2)
Total Protein: 6.4 g/dL — ABNORMAL LOW (ref 6.5–8.1)

## 2021-01-23 LAB — GLUCOSE, CAPILLARY
Glucose-Capillary: 138 mg/dL — ABNORMAL HIGH (ref 70–99)
Glucose-Capillary: 147 mg/dL — ABNORMAL HIGH (ref 70–99)
Glucose-Capillary: 97 mg/dL (ref 70–99)

## 2021-01-23 LAB — CBC
HCT: 26.7 % — ABNORMAL LOW (ref 39.0–52.0)
Hemoglobin: 8.2 g/dL — ABNORMAL LOW (ref 13.0–17.0)
MCH: 28.8 pg (ref 26.0–34.0)
MCHC: 30.7 g/dL (ref 30.0–36.0)
MCV: 93.7 fL (ref 80.0–100.0)
Platelets: 542 10*3/uL — ABNORMAL HIGH (ref 150–400)
RBC: 2.85 MIL/uL — ABNORMAL LOW (ref 4.22–5.81)
RDW: 16.8 % — ABNORMAL HIGH (ref 11.5–15.5)
WBC: 11.9 10*3/uL — ABNORMAL HIGH (ref 4.0–10.5)
nRBC: 0.9 % — ABNORMAL HIGH (ref 0.0–0.2)

## 2021-01-23 LAB — MAGNESIUM: Magnesium: 1.5 mg/dL — ABNORMAL LOW (ref 1.7–2.4)

## 2021-01-23 LAB — BRAIN NATRIURETIC PEPTIDE: B Natriuretic Peptide: 73.6 pg/mL (ref 0.0–100.0)

## 2021-01-23 LAB — PHOSPHORUS: Phosphorus: 4.8 mg/dL — ABNORMAL HIGH (ref 2.5–4.6)

## 2021-01-23 LAB — PROCALCITONIN: Procalcitonin: 0.23 ng/mL

## 2021-01-23 MED ORDER — MAGNESIUM SULFATE 4 GM/100ML IV SOLN
4.0000 g | Freq: Once | INTRAVENOUS | Status: AC
  Administered 2021-01-23: 4 g via INTRAVENOUS
  Filled 2021-01-23: qty 100

## 2021-01-23 NOTE — Progress Notes (Signed)
Patient ID: Travis [name], male   DOB: 1955-09-20, 65 y.o.   MRN: [id number] [facility] Surgery Progress Note  17 Days Post-Op  Subjective: CC-  Awake and alert this morning. Complained of abdominal pain over night and primary team ordered a CT scan. This showed improving LUQ fluid collection with drain in place, no new acute findings.  He is having loose stool. Reports some nausea, no emesis. Not eating much. WBC up 11.9 from 10.9, TMAX 99.7  Objective: Vital signs in last 24 hours: Temp:  [97.7 F (36.5 C)-99.7 F (37.6 C)] 97.7 F (36.5 C) (10/31 0758) Pulse Rate:  [95-110] 108 (10/31 0758) Resp:  [18-24] 19 (10/31 0758) BP: (114-142)/(76-90) 137/80 (10/31 0758) SpO2:  [92 %-94 %] 94 % (10/31 0758) Last BM Date: 01/23/21  Intake/Output from previous day: 10/30 0701 - 10/31 0700 In: 340 [P.O.:240; IV Piggyback:100] Out: 1045 [Urine:975; Drains:70] Intake/Output this shift: No intake/output data recorded.  PE: Gen:  Alert, NAD, pleasant Pulm:  rate and effort normal Abd: Soft, mild distension, nontender, vac to midline incision with good seal, LUQ drain with dark bloody fluid  Lab Results:  Recent Labs    01/22/21 0325 01/23/21 0337  WBC 10.9* 11.9*  HGB 8.0* 8.2*  HCT 25.5* 26.7*  PLT 522* 542*   BMET Recent Labs    01/22/21 0325 01/23/21 0337  NA 139 137  K 3.8 4.2  CL 106 105  CO2 26 26  GLUCOSE 180* 158*  BUN 24* 24*  CREATININE 0.99 0.99  CALCIUM 9.4 9.5   PT/INR No results for input(s): LABPROT, INR in the last 72 hours. CMP     Component Value Date/Time   NA 137 01/23/2021 0337   NA 138 06/20/2020 1034   NA 141 04/20/2014 1019   K 4.2 01/23/2021 0337   K 3.7 04/20/2014 1019   CL 105 01/23/2021 0337   CL 106 04/20/2014 1019   CO2 26 01/23/2021 0337   CO2 30 04/20/2014 1019   GLUCOSE 158 (H) 01/23/2021 0337   GLUCOSE 102 (H) 04/20/2014 1019   BUN 24 (H) 01/23/2021 0337   BUN 18 06/20/2020 1034   BUN 14 04/20/2014 1019    CREATININE 0.99 01/23/2021 0337   CREATININE 1.18 04/20/2014 1019   CALCIUM 9.5 01/23/2021 0337   CALCIUM 9.3 04/20/2014 1019   PROT 6.4 (L) 01/23/2021 0337   PROT 6.5 06/18/2017 1425   PROT 7.4 04/20/2014 1019   ALBUMIN 1.6 (L) 01/23/2021 0337   ALBUMIN 4.0 06/18/2017 1425   ALBUMIN 3.4 04/20/2014 1019   AST 23 01/23/2021 0337   AST 21 04/20/2014 1019   ALT 27 01/23/2021 0337   ALT 20 04/20/2014 1019   ALKPHOS 185 (H) 01/23/2021 0337   ALKPHOS 97 04/20/2014 1019   BILITOT 0.7 01/23/2021 0337   BILITOT 0.4 06/18/2017 1425   BILITOT 0.6 04/20/2014 1019   GFRNONAA >60 01/23/2021 0337   GFRNONAA >60 04/20/2014 1019   GFRNONAA >60 11/19/2012 1107   GFRAA 53 (L) 01/13/2020 1410   GFRAA >60 04/20/2014 1019   GFRAA >60 11/19/2012 1107   Lipase     Component Value Date/Time   LIPASE 31 01/01/2021 1023   LIPASE 112 04/20/2014 1019       Studies/Results: CT ABDOMEN PELVIS WO CONTRAST  Result Date: 01/23/2021 CLINICAL DATA:  Abdominal abscess, severe abdominal pain, EXAM: CT ABDOMEN AND PELVIS WITHOUT CONTRAST TECHNIQUE: Multidetector CT imaging of the abdomen and pelvis was performed following the standard protocol without IV  contrast. COMPARISON:  None. FINDINGS: Lower chest: Small left pleural effusion. Bibasilar atelectasis, left greater than right. Moderate multi-vessel coronary artery calcification. Central venous catheter tip noted within the superior cavoatrial junction. Hepatobiliary: No focal liver abnormality is seen. No gallstones, gallbladder wall thickening, or biliary dilatation. Pancreas: Unremarkable Spleen: Status post splenectomy. Percutaneous drainage catheter is again seen within the splenectomy resection bed with mixed attenuation material surrounding the catheter likely representing hemorrhagic components or residual contrast from drainage catheter placement or evaluation. Punctate foci of gas within this region are nonspecific and may relate to flushing of the  catheter or evaluation. When measured in similar fashion, the dominant collection measures 5.5 x 7.4 cm, slightly decreased in size since prior examination. Adrenals/Urinary Tract: Adrenal glands are unremarkable. Kidneys are normal, without renal calculi, focal lesion, or hydronephrosis. Bladder is unremarkable. Stomach/Bowel: Appendectomy has been performed. Stomach, small bowel, and large bowel are otherwise unremarkable. No gross free intraperitoneal gas. Mild infiltration of the greater omentum is unchanged may reflect postsurgical or residual inflammatory change. No loculated intra-abdominal fluid collections. No free intrapelvic fluid. Vascular/Lymphatic: Aortic atherosclerosis. No enlarged abdominal or pelvic lymph nodes. Reproductive: Streak artifact from right total hip arthroplasty. Partially obscures pelvic organs. Visualized prostate gland is unremarkable. Other: Laparotomy incision with wound VAC device placed within the a superficial aspect of the incision again noted. Punctate foci of gas noted within the right lower quadrant anterior abdominal wall. Mild subcutaneous edema noted within the flanks bilaterally in keeping with mild anasarca. Musculoskeletal: Right total hip arthroplasty has been performed. No acute bone abnormality. Degenerative changes are seen within the lumbar spine. IMPRESSION: Changes of splenectomy and left upper quadrant percutaneous drainage again identified. Slight interval decrease in size of dominant collection within the left upper quadrant. High attenuation material and punctate foci of gas are nonspecific and may relate to recent catheter evaluation and/or flushing. Postsurgical changes of appendectomy with residual postoperative edema or inflammatory change within the greater omentum. No discrete drainable fluid collection identified. Small left pleural effusion, likely reactive. Associated bibasilar compressive atelectasis, left greater than right. Aortic  Atherosclerosis (ICD10-I70.0). Electronically Signed   By: Fidela Salisbury M.D.   On: 01/23/2021 01:48    Anti-infectives: Anti-infectives (From admission, onward)    Start     Dose/Rate Route Frequency Ordered Stop   01/19/21 1200  cefTRIAXone (ROCEPHIN) 2 g in sodium chloride 0.9 % 100 mL IVPB        2 g 200 mL/hr over 30 Minutes Intravenous Every 24 hours 01/19/21 1056     01/19/21 1145  metroNIDAZOLE (FLAGYL) tablet 500 mg        500 mg Oral Every 12 hours 01/19/21 1056     01/18/21 0615  vancomycin (VANCOREADY) IVPB 1250 mg/250 mL  Status:  Discontinued        1,250 mg 166.7 mL/hr over 90 Minutes Intravenous Every 24 hours 01/18/21 0517 01/19/21 1400   01/17/21 1330  Ampicillin-Sulbactam (UNASYN) 3 g in sodium chloride 0.9 % 100 mL IVPB  Status:  Discontinued        3 g 200 mL/hr over 30 Minutes Intravenous Every 8 hours 01/17/21 1239 01/19/21 1056   01/09/21 1100  piperacillin-tazobactam (ZOSYN) IVPB 3.375 g  Status:  Discontinued        3.375 g 12.5 mL/hr over 240 Minutes Intravenous Every 8 hours 01/09/21 1000 01/12/21 0835   01/09/21 0830  ceFEPIme (MAXIPIME) 2 g in sodium chloride 0.9 % 100 mL IVPB  Status:  Discontinued  2 g 200 mL/hr over 30 Minutes Intravenous Every 12 hours 01/09/21 0737 01/09/21 1000   01/08/21 1400  metroNIDAZOLE (FLAGYL) IVPB 500 mg  Status:  Discontinued        500 mg 100 mL/hr over 60 Minutes Intravenous Every 12 hours 01/08/21 1059 01/09/21 1000   01/08/21 1400  ceFEPIme (MAXIPIME) 2 g in sodium chloride 0.9 % 100 mL IVPB  Status:  Discontinued        2 g 200 mL/hr over 30 Minutes Intravenous Every 24 hours 01/08/21 1114 01/09/21 0737   01/08/21 1000  vancomycin (VANCOREADY) IVPB 750 mg/150 mL  Status:  Discontinued        750 mg 150 mL/hr over 60 Minutes Intravenous Every 24 hours 01/07/21 2052 01/09/21 1000   01/08/21 0600  vancomycin (VANCOREADY) IVPB 1500 mg/300 mL  Status:  Discontinued        1,500 mg 150 mL/hr over 120 Minutes  Intravenous Every 24 hours 01/07/21 0430 01/07/21 1210   01/08/21 0600  vancomycin (VANCOREADY) IVPB 750 mg/150 mL  Status:  Discontinued        750 mg 150 mL/hr over 60 Minutes Intravenous Every 24 hours 01/07/21 1210 01/07/21 2052   01/07/21 1400  piperacillin-tazobactam (ZOSYN) IVPB 3.375 g  Status:  Discontinued        3.375 g 12.5 mL/hr over 240 Minutes Intravenous Every 8 hours 01/07/21 0430 01/08/21 1114   01/07/21 0530  piperacillin-tazobactam (ZOSYN) IVPB 3.375 g        3.375 g 100 mL/hr over 30 Minutes Intravenous STAT 01/07/21 0430 01/07/21 0622   01/07/21 0530  vancomycin (VANCOREADY) IVPB 1750 mg/350 mL        1,750 mg 175 mL/hr over 120 Minutes Intravenous  Once 01/07/21 0430 01/07/21 0841   01/05/21 1430  Ampicillin-Sulbactam (UNASYN) 3 g in sodium chloride 0.9 % 100 mL IVPB        3 g 200 mL/hr over 30 Minutes Intravenous Every 6 hours 01/05/21 1334 01/06/21 1552   01/01/21 2200  piperacillin-tazobactam (ZOSYN) IVPB 3.375 g        3.375 g 12.5 mL/hr over 240 Minutes Intravenous Every 8 hours 01/01/21 1841 01/04/21 2359   01/01/21 2100  piperacillin-tazobactam (ZOSYN) IVPB 3.375 g  Status:  Discontinued        3.375 g 100 mL/hr over 30 Minutes Intravenous Every 8 hours 01/01/21 1836 01/01/21 1841   01/01/21 1345  piperacillin-tazobactam (ZOSYN) IVPB 3.375 g        3.375 g 100 mL/hr over 30 Minutes Intravenous  Once 01/01/21 1337 01/01/21 1430        Assessment/Plan POD 22, s/p exploratory laparotomy, ileocecectomy, drainage of intraabdominal abscess 01/01/21 Dr. Grandville Silos for perforated appendicitis with abscess - Drain removed 10/24 - VAC change M/W/F to midline - Continue PT/OT, SNF vs LTACH  - on a diet and having bowel function but not eating much. Continue bowel regimen   POD 17, s/p ex lap, splenectomy, application of incisional wound vac 10/14. Dr. Bobbye Morton for splenic laceration - CT 10/24 with complex LUQ fluid collection - s/p IR drain 10/25 yielding dark  bloody fluid, culture with E coli resistant to unasyn - switched to Rocephin/ flagyl 10/27.  - post-splenectomy vaccines given 10/28 - WBC up slightly today, afebrile. CT scan early this morning showed improving LUQ fluid collection with drain in place, no new acute findings  - Continue drain and antibiotics as above for now. Palliative team is following. "If further decline per family and  patient transition to full comfort care."    FEN: TNA stopped 10/30, reg diet VTE: [facility]  ID: Zosyn 10/9>> 10/20, unasyn 10/25>>10/27, rocephin/flagyl 10/27>>, vancomycin 10/26>>10/27   Below per primary team: hemorrhagic shock 2/2 above s/p 6 u PRBC, 2 u FFP 10/14 Septic shock 2/2 perforated appendicitis  EtOH abuse Alcohol related cardiomyopathy T2DM Chronic anemia Hx of TIA HTN HLD Gout Hx of GIB CAD Tobacco abuse L pleural effusion s/p thoracentesis 10/25   LOS: 22 days    Wellington Hampshire, [facility] Surgery 01/23/2021, 8:46 AM Please see Amion for pager number during day hours 7:00am-4:30pm

## 2021-01-23 NOTE — Progress Notes (Addendum)
PROGRESS NOTE        PATIENT DETAILS Name: Travis [name] Age: 65 y.o. Sex: male Date of Birth: 1955-10-14 Admit Date: 01/01/2021 Admitting Physician Georganna Skeans, MD SVX:[id number], Alyson Locket, NP  Brief Narrative: Patient is a 65 y.o. male with history of HFrEF, CKD stage III, COPD, HTN, DM-2, HLD, nonobstructive CAD-who presented with abdominal pain-found to have sepsis from ruptured appendicitis-underwent exploratory laparotomy/ileocecectomy and drainage of intra-abdominal abscess.Patient was briefly managed in the ICU-unfortunately-on 10/14 he developed severe abdominal pain -he was then found to have hemorrhagic shock due to a spontaneous splenic rupture-he was reevaluated by general surgery and underwent a splenectomy.  He was readmitted to the ICU-remained on the ventilator-he self extubated on 10/16-he was monitored closely-upon further stability-transfer to the [facility] hospitalist service on 10/20.  Significant events: 10/09>>admit- ex lap, remained intubated post op-to ICU 10/10>>extubated 10/12>> transferred to [facility] 10/14>>hypotensive/abd pain-CT showed spleenc rupture-to OR for splenectomy. 10/16>> Self-extubated  10/18>> Increased O2 requirement to 8L 10/19>> O2 weaned to 4L 10/20>> transferred to [facility]    Radiology/Echo 10/09>> CT abdomen: Acute appendicitis with perforation 10/14>> CT abdomen: Splenic rupture with large splenic hematoma 10/15>> TTE: EF 45-50% 10/20>> CT abdomen/pelvis: Indeterminate 2.4 cm fluid collection in the right lower quadrant adjacent to the cecal resection site.  Moderate left pleural effusion with compressive atelectasis. 10/24 >> CT - 1. Splenectomy with organizing complex fluid collections in the left upper quadrant. The largest collection there is 162 mL. Smaller adjacent collections, some inseparable from the gastric serosa and with associated mass effect and architectural distortion on the stomach. Again favor  hematoma/seroma. Abscess is not excluded, but there is no gas within the collections. 2. Stable small discontiguous rim enhancing fluid collections in the lower small bowel mesentery at the pelvic inlet a short distance from the percutaneous drain. These have not significantly changed and collectively are less than 10 mL  10/27 >> CT - 1. Interval placement of percutaneous pigtail drain catheter in the left upper quadrant within an air and fluid collection, the dominant component in which the catheter is placed measuring 7.9 x 6.6 cm, previously 8.5 x 6.2 cm. 2. Previously seen small, rim enhancing fluid collection in the right lower quadrant is not significantly changed, measuring 1.5 cm. 3. Redemonstrated postoperative findings status post appendectomy and splenectomy. There has been interval removal of a surgical drain in the right lower quadrant. 4. Cholelithiasis  10/31 CT >> Changes of splenectomy and left upper quadrant percutaneous drainage again identified. Slight interval decrease in size of dominant collection within the left upper quadrant. High attenuation material and punctate foci of gas are nonspecific and may relate to recent catheter evaluation and/or flushing. Postsurgical changes of appendectomy with residual postoperative edema or inflammatory change within the greater omentum. No discrete drainable fluid collection identified. Small left pleural effusion, likely reactive. Associated bibasilar compressive atelectasis, left greater than right. Aortic Atherosclerosis.      Subjective: Patient in bed, appears comfortable, denies any headache, no fever, no chest pain or pressure, no shortness of breath, improved abdominal pain. No new focal weakness.   Objective: Vitals: Blood pressure 137/80, pulse (!) 108, temperature 97.7 F (36.5 C), temperature source Oral, resp. rate 19, height 6' (1.829 m), weight 90.4 kg, SpO2 94 %.   Exam:  Awake Alert, No new F.N deficits, Normal  affect [place].AT,PERRAL Supple Neck, No  JVD,   Symmetrical Chest wall movement, Good air movement bilaterally, CTAB RRR,No Gallops, Rubs or new Murmurs,  +ve B.Sounds, Abd Soft, W Vac and LUQ drain in place No Cyanosis, Clubbing or edema    Assessment/Plan:   Sepsis due to acute appendicitis with perforation/intra-abdominal abscesses and Bacteroides bacteremia (s/p laparotomy/ileocecectomy and drainage of intra-abdominal abscess on 10/9) is finished Zosyn course for bacteroides, now growing E. coli from 01/17/2021 in the abscess: Sepsis physiology has resolved -   postop he did well and antibiotics were stopped however on 01/16/2021 he started spiking fevers again and IR was consulted, CT scan was repeated, per general surgery he had another left upper quadrant drain placed by IR to drain questionable fluid collection.  Fluid growing E. coli.  1 out of 2 blood cultures contaminant with staph epidermis.  He is currently on Rocephin and Flagyl combination clinically improved, TNA will be stopped on 01/22/2021.  Continue oral diet.  General surgery following.  If any further decline per family and patient transition to full comfort care.   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  Acute hypoxic respiratory failure due to atelectasis/pleural effusion: Self extubated on 10/16-stable on anywhere from 2-4 L of oxygen-did develop some transient shortness of breath on 10/20-which responded to bronchodilators/diuretics.    Patient appears to have a moderate-sized left-sided pleural effusion which is likely transudative from-HFrEF/reactive to splenectomy.  PRN Lasix, also IR did ultrasound-guided thoracentesis on 01/17/2021 which was consistent with sympathetic exudative effusion gram stain negative cultures negative  Acute metabolic encephalopathy: Rapidly improving-continue supportive care.  AKI  on CKD stage IIIa: AKI hemodynamically mediated-hold Lasix, post hydration and packed RBC transfusion, improved.  HTN: BP stable-continue Coreg-losartan remains on hold  Nonischemic cardiomyopathy/HFrEF: Not in exacerbation-continue Lasix and monitor volume status closely.  Follow electrolytes.   Nonobstructive CAD: No anginal symptoms-already on beta-blocker-we will resume aspirin/statin over the next few days when more stable.  Hypomagnesemia - replaced  Anemia of chronic disease and acute disease.  1 unit of packed RBC transfusion on 01/18/2021, no signs of active bleeding, stable posttransfusion H&H.  HLD: Resume statin/fenofibrate when oral intake was stable.  COPD: Stable-continue bronchodilators  Functional quadriplegia/debility/deconditioning: Due to acute illness-continue PT/OT eval-either SNF or LTAC on discharge.  DM-2 (A1c 6.4 on 06/02/2020): CBGs stable-continue SSI  CBG (last 3)  Recent Labs    01/22/21 1810 01/22/21 2322 01/23/21 0543  GLUCAP 198* 183* 138*     Nutrition Status: Nutrition Problem: Moderate Malnutrition Etiology: acute illness, chronic illness (perforated appendix, CAD, cardiomyopathy) Signs/Symptoms: mild muscle depletion, mild fat depletion Interventions: Ensure Enlive (each supplement provides 350kcal and 20 grams of protein), TPN  Obesity: Estimated body mass index is 27.03 kg/m as calculated from the following:   Height as of this encounter: 6' (1.829 m).   Weight as of this encounter: 90.4 kg.    Procedures: 10/9>>exploratory laparotomy, ileocecectomy, drainage of intraabdominal abscess  10/14>> exploratory laparotomy and splenectomy 10/14>> left subclavian triple-lumen catheter 01/17/2021  >> left ultrasound-guided thoracentesis 600 cc exudative fluid, gram stain -ve 01/17/2021  >> LUQ JP drain by IR   Consults: General surgery, PCCM DVT Prophylaxis: Heparin Code Status:DNR Family Communication: Sister-in-law at bedside on  01/15/21.  Niece [name] [phone] over the phone on 01/17/2021, 01/20/21     Time spent: 35 minutes-Greater than 50% of this time was spent in counseling, explanation of diagnosis, planning of further management, and coordination  of care.  Diet: Diet Order             Diet regular Room service appropriate? Yes; Fluid consistency: Thin  Diet effective now                    Disposition Plan: Status is: Inpatient  Remains inpatient appropriate because: Requires inpatient level of care.   Barriers to Discharge: Perforated appendix with pelvic abscess/splenic rupture-s/p laparotomy/splenectomy-not yet stable for discharge.     MEDICATIONS: Scheduled Meds:  (feeding supplement) PROSource Plus  30 mL Oral TID BM   acetaminophen  1,000 mg Oral Q6H   Chlorhexidine Gluconate Cloth  6 each Topical Daily   docusate sodium  100 mg Oral BID   feeding supplement  237 mL Oral TID BM   heparin injection (subcutaneous)  5,000 Units Subcutaneous Q8H    HYDROmorphone (DILAUDID) injection  0.5 mg Intravenous Once   insulin aspart  0-20 Units Subcutaneous Q6H   mouth rinse  15 mL Mouth Rinse BID   methocarbamol  500 mg Oral QID   metoprolol tartrate  25 mg Oral BID   metroNIDAZOLE  500 mg Oral Q12H   mirtazapine  7.5 mg Oral QHS   oxyCODONE  20 mg Oral Q12H   pantoprazole  40 mg Oral Daily   polyethylene glycol  17 g Oral Daily   Continuous Infusions:  cefTRIAXone (ROCEPHIN)  IV 2 g (01/22/21 1201)   magnesium sulfate bolus IVPB 4 g (01/23/21 0912)   PRN Meds:.bisacodyl, dextromethorphan, HYDROmorphone (DILAUDID) injection, hydroxypropyl methylcellulose / hypromellose, ipratropium-albuterol, ondansetron (ZOFRAN) IV, oxyCODONE, phenol, simethicone, sodium chloride, sodium chloride flush   I have personally reviewed following labs and imaging studies  LABORATORY DATA:  Recent Labs  Lab 01/19/21 0226 01/20/21 0453 01/21/21 0131 01/22/21 0325 01/23/21 0337  WBC 13.0*  12.8* 10.4 10.9* 11.9*  HGB 7.7* 8.0* 8.6* 8.0* 8.2*  HCT 24.3* 25.0* 28.0* 25.5* 26.7*  PLT 523* 530* 552* 522* 542*  MCV 92.4 91.9 94.0 94.8 93.7  MCH 29.3 29.4 28.9 29.7 28.8  MCHC 31.7 32.0 30.7 31.4 30.7  RDW 18.4* 17.5* 17.2* 17.1* 16.8*    Recent Labs  Lab 01/17/21 0424 01/18/21 0359 01/19/21 0226 01/20/21 0453 01/21/21 0131 01/22/21 0325 01/23/21 0337  NA 137   < > 139 139 137 139 137  K 4.8   < > 4.2 3.9 4.1 3.8 4.2  CL 99   < > 105 104 104 106 105  CO2 26   < > 27 27 26 26 26   GLUCOSE 205*   < > 191* 124* 175* 180* 158*  BUN 52*   < > 34* 28* 25* 24* 24*  CREATININE 1.80*   < > 1.46* 1.20 1.11 0.99 0.99  CALCIUM 9.2   < > 9.1 9.2 9.3 9.4 9.5  AST 44*   < > 45* 41 28 22 23   ALT 36   < > 42 42 34 26 27  ALKPHOS 230*   < > 226* 241* 221* 181* 185*  BILITOT 1.1   < > 1.3* 1.0 0.8 0.8 0.7  ALBUMIN 1.8*   < > 1.5* 1.6* 1.6* 1.5* 1.6*  MG  --   --  2.2  --   --   --  1.5*  PROCALCITON 0.80   < > 0.41 0.34 0.26 0.21 0.23  LATICACIDVEN 1.5  --   --   --   --   --   --   BNP 74.1   < >  177.6* 185.7* 124.9* 77.8 73.6   < > = values in this interval not displayed.     RADIOLOGY STUDIES/RESULTS: CT ABDOMEN PELVIS WO CONTRAST  Result Date: 01/23/2021 CLINICAL DATA:  Abdominal abscess, severe abdominal pain, EXAM: CT ABDOMEN AND PELVIS WITHOUT CONTRAST TECHNIQUE: Multidetector CT imaging of the abdomen and pelvis was performed following the standard protocol without IV contrast. COMPARISON:  None. FINDINGS: Lower chest: Small left pleural effusion. Bibasilar atelectasis, left greater than right. Moderate multi-vessel coronary artery calcification. Central venous catheter tip noted within the superior cavoatrial junction. Hepatobiliary: No focal liver abnormality is seen. No gallstones, gallbladder wall thickening, or biliary dilatation. Pancreas: Unremarkable Spleen: Status post splenectomy. Percutaneous drainage catheter is again seen within the splenectomy resection bed with  mixed attenuation material surrounding the catheter likely representing hemorrhagic components or residual contrast from drainage catheter placement or evaluation. Punctate foci of gas within this region are nonspecific and may relate to flushing of the catheter or evaluation. When measured in similar fashion, the dominant collection measures 5.5 x 7.4 cm, slightly decreased in size since prior examination. Adrenals/Urinary Tract: Adrenal glands are unremarkable. Kidneys are normal, without renal calculi, focal lesion, or hydronephrosis. Bladder is unremarkable. Stomach/Bowel: Appendectomy has been performed. Stomach, small bowel, and large bowel are otherwise unremarkable. No gross free intraperitoneal gas. Mild infiltration of the greater omentum is unchanged may reflect postsurgical or residual inflammatory change. No loculated intra-abdominal fluid collections. No free intrapelvic fluid. Vascular/Lymphatic: Aortic atherosclerosis. No enlarged abdominal or pelvic lymph nodes. Reproductive: Streak artifact from right total hip arthroplasty. Partially obscures pelvic organs. Visualized prostate gland is unremarkable. Other: Laparotomy incision with wound VAC device placed within the a superficial aspect of the incision again noted. Punctate foci of gas noted within the right lower quadrant anterior abdominal wall. Mild subcutaneous edema noted within the flanks bilaterally in keeping with mild anasarca. Musculoskeletal: Right total hip arthroplasty has been performed. No acute bone abnormality. Degenerative changes are seen within the lumbar spine. IMPRESSION: Changes of splenectomy and left upper quadrant percutaneous drainage again identified. Slight interval decrease in size of dominant collection within the left upper quadrant. High attenuation material and punctate foci of gas are nonspecific and may relate to recent catheter evaluation and/or flushing. Postsurgical changes of appendectomy with residual  postoperative edema or inflammatory change within the greater omentum. No discrete drainable fluid collection identified. Small left pleural effusion, likely reactive. Associated bibasilar compressive atelectasis, left greater than right. Aortic Atherosclerosis (ICD10-I70.0). Electronically Signed   By: Fidela Salisbury M.D.   On: 01/23/2021 01:48     LOS: 22 days   Signature  Lala Lund M.D on 01/23/2021 at 9:45 AM   -  To page go to [email or web address]

## 2021-01-23 NOTE — Significant Event (Addendum)
Pt with increased abd pain, required additional dose of 0.5mg  IV dilaudid.  Pain now controlled.  Abd more distended than earlier in shift per RN.  Repeat CT scan considered, but pain is controlled, so will hold off for now, since even if we do have new major findings in abdomen, per PN from earlier today: "If any further decline per family and patient transition to full comfort care."  Addendum: now told by RN pt still TTP and pain 7/10.  Will go ahead and get repeat CT AP.

## 2021-01-23 NOTE — Plan of Care (Signed)
  Problem: Education: Goal: Knowledge of General Education information will improve Description: Including pain rating scale, medication(s)/side effects and non-pharmacologic comfort measures Outcome: Progressing   Problem: Health Behavior/Discharge Planning: Goal: Ability to manage health-related needs will improve Outcome: Progressing   Problem: Clinical Measurements: Goal: Ability to maintain clinical measurements within normal limits will improve Outcome: Progressing Goal: Will remain free from infection Outcome: Progressing Goal: Diagnostic test results will improve Outcome: Progressing Goal: Respiratory complications will improve Outcome: Progressing Goal: Cardiovascular complication will be avoided Outcome: Progressing   Problem: Activity: Goal: Risk for activity intolerance will decrease Outcome: Progressing   Problem: Nutrition: Goal: Adequate nutrition will be maintained Outcome: Progressing   Problem: Coping: Goal: Level of anxiety will decrease Outcome: Progressing   Problem: Elimination: Goal: Will not experience complications related to bowel motility Outcome: Progressing Goal: Will not experience complications related to urinary retention Outcome: Progressing   Problem: Pain Managment: Goal: General experience of comfort will improve Outcome: Progressing   Problem: Safety: Goal: Ability to remain free from injury will improve Outcome: Progressing   Problem: Skin Integrity: Goal: Risk for impaired skin integrity will decrease Outcome: Progressing   Problem: Education: Goal: Ability to demonstrate management of disease process will improve Outcome: Progressing Goal: Ability to verbalize understanding of medication therapies will improve Outcome: Progressing Goal: Individualized Educational Video(s) Outcome: Progressing   Problem: Activity: Goal: Capacity to carry out activities will improve Outcome: Progressing   Problem: Cardiac: Goal:  Ability to achieve and maintain adequate cardiopulmonary perfusion will improve Outcome: Progressing   Problem: Education: Goal: Knowledge of disease or condition will improve Outcome: Progressing Goal: Knowledge of the prescribed therapeutic regimen will improve Outcome: Progressing Goal: Individualized Educational Video(s) Outcome: Progressing   Problem: Activity: Goal: Ability to tolerate increased activity will improve Outcome: Progressing Goal: Will verbalize the importance of balancing activity with adequate rest periods Outcome: Progressing   Problem: Respiratory: Goal: Ability to maintain a clear airway will improve Outcome: Progressing Goal: Levels of oxygenation will improve Outcome: Progressing Goal: Ability to maintain adequate ventilation will improve Outcome: Progressing   

## 2021-01-23 NOTE — Consult Note (Signed)
[place] Nurse wound follow up Patient receiving care in [facility] 5W30 Wound type: Midline abdominal surgical incision Pressure Injury POA: NA  Wound bed: pink granulation tissue. Bleeding decreased when dressing removed.  Drainage (amount, consistency, odor) sanguinous in canister Periwound: Intact Dressing procedure/placement/frequency: 2 pieces of black foam removed. 2 pieces of black foam used to fill the wound bed which extends down to the pubic hair line. Medium size Travis [name] # [phone]) Drape applied and immediate suction obtained at 125 mmHg. Canister Travis [name] # [phone]). Use NS to loosen foam and premedicate prior to dressing change. Very painful for the patient.  M/W/F vac change. WOC will follow. Ordered 3 dressing kit to be placed at bedside. No more ordered until POC is decided, possible comfort care.    Travis Marseilles [name], MSN, RN, [place], Lysle Pearl, [facility] Wound Treatment Associate Pager [phone]

## 2021-01-24 ENCOUNTER — Other Ambulatory Visit: Payer: Self-pay | Admitting: Radiology

## 2021-01-24 DIAGNOSIS — Z515 Encounter for palliative care: Secondary | ICD-10-CM | POA: Diagnosis not present

## 2021-01-24 DIAGNOSIS — Z9889 Other specified postprocedural states: Secondary | ICD-10-CM

## 2021-01-24 LAB — GLUCOSE, CAPILLARY
Glucose-Capillary: 110 mg/dL — ABNORMAL HIGH (ref 70–99)
Glucose-Capillary: 119 mg/dL — ABNORMAL HIGH (ref 70–99)
Glucose-Capillary: 127 mg/dL — ABNORMAL HIGH (ref 70–99)
Glucose-Capillary: 130 mg/dL — ABNORMAL HIGH (ref 70–99)
Glucose-Capillary: 162 mg/dL — ABNORMAL HIGH (ref 70–99)

## 2021-01-24 LAB — COMPREHENSIVE METABOLIC PANEL
ALT: 26 U/L (ref 0–44)
AST: 23 U/L (ref 15–41)
Albumin: 1.7 g/dL — ABNORMAL LOW (ref 3.5–5.0)
Alkaline Phosphatase: 182 U/L — ABNORMAL HIGH (ref 38–126)
Anion gap: 10 (ref 5–15)
BUN: 24 mg/dL — ABNORMAL HIGH (ref 8–23)
CO2: 25 mmol/L (ref 22–32)
Calcium: 9.4 mg/dL (ref 8.9–10.3)
Chloride: 103 mmol/L (ref 98–111)
Creatinine, Ser: 1.11 mg/dL (ref 0.61–1.24)
GFR, Estimated: >60 mL/min (ref >60)
Glucose, Bld: 126 mg/dL — ABNORMAL HIGH (ref 70–99)
Potassium: 4.3 mmol/L (ref 3.5–5.1)
Sodium: 138 mmol/L (ref 135–145)
Total Bilirubin: 0.7 mg/dL (ref 0.3–1.2)
Total Protein: 6.4 g/dL — ABNORMAL LOW (ref 6.5–8.1)

## 2021-01-24 LAB — CBC
HCT: 26.9 % — ABNORMAL LOW (ref 39.0–52.0)
Hemoglobin: 8.5 g/dL — ABNORMAL LOW (ref 13.0–17.0)
MCH: 29.3 pg (ref 26.0–34.0)
MCHC: 31.6 g/dL (ref 30.0–36.0)
MCV: 92.8 fL (ref 80.0–100.0)
Platelets: 552 10*3/uL — ABNORMAL HIGH (ref 150–400)
RBC: 2.9 MIL/uL — ABNORMAL LOW (ref 4.22–5.81)
RDW: 16.6 % — ABNORMAL HIGH (ref 11.5–15.5)
WBC: 11.8 10*3/uL — ABNORMAL HIGH (ref 4.0–10.5)
nRBC: 0.5 % — ABNORMAL HIGH (ref 0.0–0.2)

## 2021-01-24 LAB — MAGNESIUM: Magnesium: 1.8 mg/dL (ref 1.7–2.4)

## 2021-01-24 LAB — PROCALCITONIN: Procalcitonin: 0.25 ng/mL

## 2021-01-24 NOTE — Progress Notes (Signed)
Patient ID: Travis [name], male   DOB: 1955/10/31, 65 y.o.   MRN: [id number] [facility] Progress Note  18 Days Post-Op  Subjective: CC-  No new complaints. Abdomen still sore. Denies n/v. No appetite. Tolerating liquids but not taking in much else. Passing flatus, last BM 2 days ago per chart.  Objective: Vital signs in last 24 hours: Temp:  [98 F (36.7 C)-99.2 F (37.3 C)] 98.4 F (36.9 C) (11/01 0738) Pulse Rate:  [90-101] 98 (11/01 0825) Resp:  [14-19] 18 (11/01 0825) BP: (116-129)/(71-83) 120/71 (11/01 0738) SpO2:  [90 %-96 %] 90 % (11/01 0825) Weight:  [90.7 kg] 90.7 kg (11/01 0447) Last BM Date: 01/23/21  Intake/Output from previous day: 10/31 0701 - 11/01 0700 In: 340 [P.O.:240; IV Piggyback:100] Out: 1445 [Urine:1400; Drains:45] Intake/Output this shift: Total I/O In: -  Out: 10 [Drains:10]  PE: Gen:  Alert, NAD, pleasant Pulm:  rate and effort normal Abd: Soft, mild distension, mild right sided and epigastric TTP without rebound or guarding, vac to midline incision with good seal, LUQ drain with dark bloody fluid  Lab Results:  Recent Labs    01/23/21 0337 01/24/21 0050  WBC 11.9* 11.8*  HGB 8.2* 8.5*  HCT 26.7* 26.9*  PLT 542* 552*   BMET Recent Labs    01/23/21 0337 01/24/21 0050  NA 137 138  K 4.2 4.3  CL 105 103  CO2 26 25  GLUCOSE 158* 126*  BUN 24* 24*  CREATININE 0.99 1.11  CALCIUM 9.5 9.4   PT/INR No results for input(s): LABPROT, INR in the last 72 hours. CMP     Component Value Date/Time   NA 138 01/24/2021 0050   NA 138 06/20/2020 1034   NA 141 04/20/2014 1019   K 4.3 01/24/2021 0050   K 3.7 04/20/2014 1019   CL 103 01/24/2021 0050   CL 106 04/20/2014 1019   CO2 25 01/24/2021 0050   CO2 30 04/20/2014 1019   GLUCOSE 126 (H) 01/24/2021 0050   GLUCOSE 102 (H) 04/20/2014 1019   BUN 24 (H) 01/24/2021 0050   BUN 18 06/20/2020 1034   BUN 14 04/20/2014 1019   CREATININE 1.11 01/24/2021 0050   CREATININE 1.18  04/20/2014 1019   CALCIUM 9.4 01/24/2021 0050   CALCIUM 9.3 04/20/2014 1019   PROT 6.4 (L) 01/24/2021 0050   PROT 6.5 06/18/2017 1425   PROT 7.4 04/20/2014 1019   ALBUMIN 1.7 (L) 01/24/2021 0050   ALBUMIN 4.0 06/18/2017 1425   ALBUMIN 3.4 04/20/2014 1019   AST 23 01/24/2021 0050   AST 21 04/20/2014 1019   ALT 26 01/24/2021 0050   ALT 20 04/20/2014 1019   ALKPHOS 182 (H) 01/24/2021 0050   ALKPHOS 97 04/20/2014 1019   BILITOT 0.7 01/24/2021 0050   BILITOT 0.4 06/18/2017 1425   BILITOT 0.6 04/20/2014 1019   GFRNONAA >60 01/24/2021 0050   GFRNONAA >60 04/20/2014 1019   GFRNONAA >60 11/19/2012 1107   GFRAA 53 (L) 01/13/2020 1410   GFRAA >60 04/20/2014 1019   GFRAA >60 11/19/2012 1107   Lipase     Component Value Date/Time   LIPASE 31 01/01/2021 1023   LIPASE 112 04/20/2014 1019       Studies/Results: CT ABDOMEN PELVIS WO CONTRAST  Result Date: 01/23/2021 CLINICAL DATA:  Abdominal abscess, severe abdominal pain, EXAM: CT ABDOMEN AND PELVIS WITHOUT CONTRAST TECHNIQUE: Multidetector CT imaging of the abdomen and pelvis was performed following the standard protocol without IV contrast. COMPARISON:  None. FINDINGS:  Lower chest: Small left pleural effusion. Bibasilar atelectasis, left greater than right. Moderate multi-vessel coronary artery calcification. Central venous catheter tip noted within the superior cavoatrial junction. Hepatobiliary: No focal liver abnormality is seen. No gallstones, gallbladder wall thickening, or biliary dilatation. Pancreas: Unremarkable Spleen: Status post splenectomy. Percutaneous drainage catheter is again seen within the splenectomy resection bed with mixed attenuation material surrounding the catheter likely representing hemorrhagic components or residual contrast from drainage catheter placement or evaluation. Punctate foci of gas within this region are nonspecific and may relate to flushing of the catheter or evaluation. When measured in similar  fashion, the dominant collection measures 5.5 x 7.4 cm, slightly decreased in size since prior examination. Adrenals/Urinary Tract: Adrenal glands are unremarkable. Kidneys are normal, without renal calculi, focal lesion, or hydronephrosis. Bladder is unremarkable. Stomach/Bowel: Appendectomy has been performed. Stomach, small bowel, and large bowel are otherwise unremarkable. No gross free intraperitoneal gas. Mild infiltration of the greater omentum is unchanged may reflect postsurgical or residual inflammatory change. No loculated intra-abdominal fluid collections. No free intrapelvic fluid. Vascular/Lymphatic: Aortic atherosclerosis. No enlarged abdominal or pelvic lymph nodes. Reproductive: Streak artifact from right total hip arthroplasty. Partially obscures pelvic organs. Visualized prostate gland is unremarkable. Other: Laparotomy incision with wound VAC device placed within the a superficial aspect of the incision again noted. Punctate foci of gas noted within the right lower quadrant anterior abdominal wall. Mild subcutaneous edema noted within the flanks bilaterally in keeping with mild anasarca. Musculoskeletal: Right total hip arthroplasty has been performed. No acute bone abnormality. Degenerative changes are seen within the lumbar spine. IMPRESSION: Changes of splenectomy and left upper quadrant percutaneous drainage again identified. Slight interval decrease in size of dominant collection within the left upper quadrant. High attenuation material and punctate foci of gas are nonspecific and may relate to recent catheter evaluation and/or flushing. Postsurgical changes of appendectomy with residual postoperative edema or inflammatory change within the greater omentum. No discrete drainable fluid collection identified. Small left pleural effusion, likely reactive. Associated bibasilar compressive atelectasis, left greater than right. Aortic Atherosclerosis (ICD10-I70.0). Electronically Signed   By:  Fidela Salisbury M.D.   On: 01/23/2021 01:48    Anti-infectives: Anti-infectives (From admission, onward)    Start     Dose/Rate Route Frequency Ordered Stop   01/19/21 1200  cefTRIAXone (ROCEPHIN) 2 g in sodium chloride 0.9 % 100 mL IVPB        2 g 200 mL/hr over 30 Minutes Intravenous Every 24 hours 01/19/21 1056     01/19/21 1145  metroNIDAZOLE (FLAGYL) tablet 500 mg        500 mg Oral Every 12 hours 01/19/21 1056     01/18/21 0615  vancomycin (VANCOREADY) IVPB 1250 mg/250 mL  Status:  Discontinued        1,250 mg 166.7 mL/hr over 90 Minutes Intravenous Every 24 hours 01/18/21 0517 01/19/21 1400   01/17/21 1330  Ampicillin-Sulbactam (UNASYN) 3 g in sodium chloride 0.9 % 100 mL IVPB  Status:  Discontinued        3 g 200 mL/hr over 30 Minutes Intravenous Every 8 hours 01/17/21 1239 01/19/21 1056   01/09/21 1100  piperacillin-tazobactam (ZOSYN) IVPB 3.375 g  Status:  Discontinued        3.375 g 12.5 mL/hr over 240 Minutes Intravenous Every 8 hours 01/09/21 1000 01/12/21 0835   01/09/21 0830  ceFEPIme (MAXIPIME) 2 g in sodium chloride 0.9 % 100 mL IVPB  Status:  Discontinued  2 g 200 mL/hr over 30 Minutes Intravenous Every 12 hours 01/09/21 0737 01/09/21 1000   01/08/21 1400  metroNIDAZOLE (FLAGYL) IVPB 500 mg  Status:  Discontinued        500 mg 100 mL/hr over 60 Minutes Intravenous Every 12 hours 01/08/21 1059 01/09/21 1000   01/08/21 1400  ceFEPIme (MAXIPIME) 2 g in sodium chloride 0.9 % 100 mL IVPB  Status:  Discontinued        2 g 200 mL/hr over 30 Minutes Intravenous Every 24 hours 01/08/21 1114 01/09/21 0737   01/08/21 1000  vancomycin (VANCOREADY) IVPB 750 mg/150 mL  Status:  Discontinued        750 mg 150 mL/hr over 60 Minutes Intravenous Every 24 hours 01/07/21 2052 01/09/21 1000   01/08/21 0600  vancomycin (VANCOREADY) IVPB 1500 mg/300 mL  Status:  Discontinued        1,500 mg 150 mL/hr over 120 Minutes Intravenous Every 24 hours 01/07/21 0430 01/07/21 1210   01/08/21  0600  vancomycin (VANCOREADY) IVPB 750 mg/150 mL  Status:  Discontinued        750 mg 150 mL/hr over 60 Minutes Intravenous Every 24 hours 01/07/21 1210 01/07/21 2052   01/07/21 1400  piperacillin-tazobactam (ZOSYN) IVPB 3.375 g  Status:  Discontinued        3.375 g 12.5 mL/hr over 240 Minutes Intravenous Every 8 hours 01/07/21 0430 01/08/21 1114   01/07/21 0530  piperacillin-tazobactam (ZOSYN) IVPB 3.375 g        3.375 g 100 mL/hr over 30 Minutes Intravenous STAT 01/07/21 0430 01/07/21 0622   01/07/21 0530  vancomycin (VANCOREADY) IVPB 1750 mg/350 mL        1,750 mg 175 mL/hr over 120 Minutes Intravenous  Once 01/07/21 0430 01/07/21 0841   01/05/21 1430  Ampicillin-Sulbactam (UNASYN) 3 g in sodium chloride 0.9 % 100 mL IVPB        3 g 200 mL/hr over 30 Minutes Intravenous Every 6 hours 01/05/21 1334 01/06/21 1552   01/01/21 2200  piperacillin-tazobactam (ZOSYN) IVPB 3.375 g        3.375 g 12.5 mL/hr over 240 Minutes Intravenous Every 8 hours 01/01/21 1841 01/04/21 2359   01/01/21 2100  piperacillin-tazobactam (ZOSYN) IVPB 3.375 g  Status:  Discontinued        3.375 g 100 mL/hr over 30 Minutes Intravenous Every 8 hours 01/01/21 1836 01/01/21 1841   01/01/21 1345  piperacillin-tazobactam (ZOSYN) IVPB 3.375 g        3.375 g 100 mL/hr over 30 Minutes Intravenous  Once 01/01/21 1337 01/01/21 1430        Assessment/Plan POD 23, s/p exploratory laparotomy, ileocecectomy, drainage of intraabdominal abscess 01/01/21 Dr. Grandville Silos for perforated appendicitis with abscess - Drain removed 10/24 - VAC change M/W/F to midline - Continue PT/OT, SNF vs LTACH  - on a diet and having bowel function but not eating much. Continue bowel regimen. Encourage PO intake   POD 18, s/p ex lap, splenectomy, application of incisional wound vac 10/14. Dr. Bobbye Morton for splenic laceration - CT 10/24 with complex LUQ fluid collection - s/p IR drain 10/25 yielding dark bloody fluid, culture with E coli resistant to  unasyn - switched to Rocephin/ flagyl 10/27.  - post-splenectomy vaccines given 10/28 - CT scan 10/31 showed improving LUQ fluid collection with drain in place, no new acute findings  - WBC stable elevated at 11.8, monitor - Continue drain and antibiotics as above for now. Palliative team is following. "If further decline  per family and patient transition to full comfort care."    FEN: TNA stopped 10/30, reg diet VTE: SQH  ID: Zosyn 10/9>> 10/20, unasyn 10/25>>10/27, rocephin/flagyl 10/27>>, vancomycin 10/26>>10/27   Below per primary team: hemorrhagic shock 2/2 above s/p 6 u PRBC, 2 u FFP 10/14 Septic shock 2/2 perforated appendicitis  EtOH abuse Alcohol related cardiomyopathy T2DM Chronic anemia Hx of TIA HTN HLD Gout Hx of GIB CAD Tobacco abuse L pleural effusion s/p thoracentesis 10/25   LOS: 23 days    Wellington Hampshire, [facility] 01/24/2021, 9:05 AM Please see Amion for pager number during day hours 7:00am-4:30pm

## 2021-01-24 NOTE — Progress Notes (Signed)
Physical Therapy Treatment Patient Details Name: Travis [name] MRN: [id number] DOB: 19-Nov-1955 Today's Date: 01/24/2021   History of Present Illness Pt is a 65 y/o male admitted 10/9 with complaints of R lower quadrant abdominal pain. Found with intra-abdominal sepsis with perforated appendix and abdominal abscess; s/p diagnostic laparoscopy, ileocecectomy, drainage of interaabdominal abscess 10/9. Post op ileus. 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 Pt self extubated 10/16. L thoracentesis 10/25 with 600cc drained. PMHx: CHF, CKD, COPD, TIA, diabetes, HTN, gout, CAD.    PT Comments    Patient reluctant to move due to fear of increasing his pain (especially back), despite pre-medication. Does not like to feel rushed and limits how much activity he will participate in. Able to move to EOB with supervision and HOB elevated and sat EOB ~15 minutes with no external support and without UE support. See OT note regarding ADLs. Patient with decreased understanding of importance of activity and self-limits his participation in anticipation of pain. Will try to see later in the day on next visit to see if this is more with his timing. Will continue to pre-medicate.      Recommendations for follow up therapy are one component of a multi-disciplinary discharge planning process, led by the attending physician.  Recommendations may be updated based on patient status, additional functional criteria and insurance authorization.  Follow Up Recommendations  Skilled nursing-short term rehab (<3 hours/day)     Assistance Recommended at Discharge Frequent or constant Supervision/Assistance  Equipment Recommendations  Wheelchair cushion (measurements PT);Wheelchair (measurements PT)    Recommendations for Other Services       Precautions / Restrictions Precautions Precautions: Fall Precaution Comments: wound vac and L drain in abdomen     Mobility  Bed Mobility Overal bed mobility:  Needs Assistance Bed Mobility: Supine to Sit;Sit to Supine Rolling:  (pt refused rolling)   Supine to sit: HOB elevated;Supervision   Sit to sidelying: Supervision General bed mobility comments: educated on moving to/from sit via sidelying with pt prefering to transition to/from supine    Transfers Overall transfer level: Needs assistance Equipment used: None Transfers: Lateral/Scoot Transfers            Lateral/Scoot Transfers: Supervision General transfer comment: pt initiated sit to stand x 1 (did not even clear buttocks) and reported increased pain and refused to attempt further; prior to return to supine, pt laterally scooted to his left toward [facility] with supervision (+cleared his buttocks easily)    Ambulation/Gait             General Gait Details: NT   Stairs             Wheelchair Mobility    Modified Rankin (Stroke Patients Only)       Balance Overall balance assessment: Needs assistance Sitting-balance support: Feet supported;Bilateral upper extremity supported Sitting balance-Leahy Scale: Fair Sitting balance - Comments: leaning back on EOB                                    Cognition Arousal/Alertness: Awake/alert Behavior During Therapy: Flat affect Overall Cognitive Status: No family/caregiver present to determine baseline cognitive functioning Area of Impairment: Orientation;Attention;Following commands;Problem solving;Awareness                 Orientation Level: Disoriented to;Time (situation NT) Current Attention Level: Sustained Memory: Decreased recall of precautions (cues for how to move to protect abd incision) Following Commands: Follows  one step commands consistently;Follows one step commands with increased time   Awareness: Intellectual Problem Solving: Slow processing;Decreased initiation;Requires verbal cues;Requires tactile cues General Comments: very focused on causing increased pain (especially back);  does not like to be rushed and does not seem to understand importance of activity towards improving his health; very self-limiting and with decr cognition        Exercises General Exercises - Lower Extremity Ankle Circles/Pumps: Both;10 reps;Supine;AROM    General Comments General comments (skin integrity, edema, etc.): see OT note re: ADLs completed      Pertinent Vitals/Pain Pain Assessment: Faces Faces Pain Scale: Hurts even more Pain Location: stomach, back Pain Descriptors / Indicators: Grimacing;Guarding Pain Intervention(s): Monitored during session;Premedicated before session;Repositioned    Home Living                          Prior Function            PT Goals (current goals can now be found in the care plan section) Acute Rehab PT Goals Patient Stated Goal: none stated PT Goal Formulation: With patient Time For Goal Achievement: 02/07/21 Potential to Achieve Goals: Fair Progress towards PT goals: Progressing toward goals (in areas he will address)    Frequency    Min 2X/week      PT Plan Discharge plan needs to be updated    Co-evaluation              AM-PAC PT "6 Clicks" Mobility   Outcome Measure  Help needed turning from your back to your side while in a flat bed without using bedrails?: A Lot Help needed moving from lying on your back to sitting on the side of a flat bed without using bedrails?: A Little Help needed moving to and from a bed to a chair (including a wheelchair)?: Total Help needed standing up from a chair using your arms (e.g., wheelchair or bedside chair)?: Total Help needed to walk in hospital room?: Total Help needed climbing 3-5 steps with a railing? : Total 6 Click Score: 9    End of Session   Activity Tolerance: Patient limited by pain Patient left: in bed;with call bell/phone within reach;with bed alarm set Nurse Communication: Mobility status PT Visit Diagnosis: Difficulty in walking, not elsewhere  classified (R26.2);Other abnormalities of gait and mobility (R26.89);Muscle weakness (generalized) (M62.81);Pain Pain - part of body:  (abdomen; back)     Time: 8338-2505 PT Time Calculation (min) (ACUTE ONLY): 37 min  Charges:  $Therapeutic Activity: 8-22 mins                      Arby Barrette, PT Acute Rehabilitation Services  Pager [phone] Office [phone]    Rexanne Mano 01/24/2021, 9:36 AM

## 2021-01-24 NOTE — Progress Notes (Signed)
Referring Physician(s): Dr. Bobbye Morton  Supervising Physician: Corrie Mckusick  Patient Status:  Travis [name] - In-pt  Chief Complaint:  S/p appendectomy and partial cecectomy with spontaneous splenic rupture requiring splenectomy. Found to a=have a LUQ abscess. IR placed a LUQ abscess drain on 10.25.25 by Dr. Laurence Ferrari  Subjective:  Patient alert laying in bed. States he in pain but does not specify where. Say "Keep the medicine going:  Allergies: Patient has no known allergies.  Medications: Prior to Admission medications   Medication Sig Start Date End Date Taking? Authorizing Provider  albuterol (VENTOLIN HFA) 108 (90 Base) MCG/ACT inhaler Inhale 2 puffs into the lungs every 6 (six) hours as needed for wheezing or shortness of breath.   Yes [provider]  allopurinol (ZYLOPRIM) 100 MG tablet Take 1 tablet (100 mg total) by mouth 2 (two) times daily. 12/19/20  Yes [name], NP  aspirin 325 MG tablet Take 162.5 mg by mouth in the morning.   Yes [provider]  atorvastatin (LIPITOR) 20 MG tablet Take 20 mg by mouth daily.   Yes [provider]  carvedilol (COREG) 6.25 MG tablet Take 1 tablet (6.25 mg total) by mouth 2 (two) times daily. 06/03/20  Yes Theora Gianotti, NP  cetirizine (ZYRTEC) 10 MG tablet Take 1 tablet (10 mg total) by mouth daily. 07/07/20  Yes [name], NP  fenofibrate (TRICOR) 145 MG tablet TAKE 1 TABLET BY MOUTH ONCE A DAY 11/23/20  Yes Dutch Quint B, FNP  ferrous sulfate 325 (65 FE) MG tablet Take 1 tablet (325 mg total) by mouth daily with breakfast. 06/02/20  Yes Baity, Coralie Keens, NP  fluticasone (FLONASE) 50 MCG/ACT nasal spray Place 1-2 sprays into both nostrils daily as needed for allergies or rhinitis.   Yes [provider]  furosemide (LASIX) 40 MG tablet Take 1 tablet by mouth daily.   Yes [provider]  hydroxypropyl methylcellulose / hypromellose (ISOPTO TEARS / GONIOVISC) 2.5 % ophthalmic  solution Place 1 drop into both eyes 3 (three) times daily as needed for dry eyes.   Yes [provider]  losartan (COZAAR) 25 MG tablet Take 0.5 tablets (12.5 mg total) by mouth daily. 12/19/20 03/19/21 Yes [name], NP  magnesium oxide (MAG-OX) 400 MG tablet Take 1 tablet (400 mg total) by mouth daily. 12/19/20  Yes [name], NP  multivitamin (ONE-A-DAY MEN'S) TABS tablet Take 1 tablet by mouth daily with breakfast.   Yes [provider]  Omega-3 Fatty Acids (FISH OIL PO) Take 1 capsule by mouth daily.   Yes [provider]  pantoprazole (PROTONIX) 40 MG tablet Take 1 tablet (40 mg total) by mouth daily. 06/02/20  Yes Baity, Coralie Keens, NP  triamcinolone cream (KENALOG) 0.1 % APPLY TO AFFECTED AREAS TWICE A DAY AS NEEDED. AVOID FACE, GROIN, OR UNDERARMS. Patient taking differently: Apply 1 application topically 2 (two) times daily as needed (dry skin, rash). 11/01/20  Yes Dutch Quint B, FNP     Vital Signs: BP 119/81 (BP Location: Right Arm)   Pulse 96   Temp 98.8 F (37.1 C) (Oral)   Resp 19   Ht 6' (1.829 m)   Wt 199 lb 15.3 oz (90.7 kg)   SpO2 92%   BMI 27.12 kg/m   Physical Exam Vitals and nursing note reviewed.  Constitutional:      Appearance: He is well-developed.  HENT:     Head: Normocephalic.  Pulmonary:     Effort:  Pulmonary effort is normal.  Abdominal:     Comments: LUQ abscess drain with 25 ml of dark brown output. Site is unremarkable with no erythema, edema, tenderness, bleeding or drainage noted at exit site. Suture and stat lock in place. Dressing is clean dry and intact. Drain is able to be flushed easily.    Musculoskeletal:        General: Normal range of motion.     Cervical back: Normal range of motion.  Skin:    General: Skin is dry.  Neurological:     Mental Status: He is alert and oriented to person, place, and time.    Imaging: CT ABDOMEN PELVIS WO CONTRAST  Result Date: 01/23/2021 CLINICAL DATA:  Abdominal  abscess, severe abdominal pain, EXAM: CT ABDOMEN AND PELVIS WITHOUT CONTRAST TECHNIQUE: Multidetector CT imaging of the abdomen and pelvis was performed following the standard protocol without IV contrast. COMPARISON:  None. FINDINGS: Lower chest: Small left pleural effusion. Bibasilar atelectasis, left greater than right. Moderate multi-vessel coronary artery calcification. Central venous catheter tip noted within the superior cavoatrial junction. Hepatobiliary: No focal liver abnormality is seen. No gallstones, gallbladder wall thickening, or biliary dilatation. Pancreas: Unremarkable Spleen: Status post splenectomy. Percutaneous drainage catheter is again seen within the splenectomy resection bed with mixed attenuation material surrounding the catheter likely representing hemorrhagic components or residual contrast from drainage catheter placement or evaluation. Punctate foci of gas within this region are nonspecific and may relate to flushing of the catheter or evaluation. When measured in similar fashion, the dominant collection measures 5.5 x 7.4 cm, slightly decreased in size since prior examination. Adrenals/Urinary Tract: Adrenal glands are unremarkable. Kidneys are normal, without renal calculi, focal lesion, or hydronephrosis. Bladder is unremarkable. Stomach/Bowel: Appendectomy has been performed. Stomach, small bowel, and large bowel are otherwise unremarkable. No gross free intraperitoneal gas. Mild infiltration of the greater omentum is unchanged may reflect postsurgical or residual inflammatory change. No loculated intra-abdominal fluid collections. No free intrapelvic fluid. Vascular/Lymphatic: Aortic atherosclerosis. No enlarged abdominal or pelvic lymph nodes. Reproductive: Streak artifact from right total hip arthroplasty. Partially obscures pelvic organs. Visualized prostate gland is unremarkable. Other: Laparotomy incision with wound VAC device placed within the a superficial aspect of the  incision again noted. Punctate foci of gas noted within the right lower quadrant anterior abdominal wall. Mild subcutaneous edema noted within the flanks bilaterally in keeping with mild anasarca. Musculoskeletal: Right total hip arthroplasty has been performed. No acute bone abnormality. Degenerative changes are seen within the lumbar spine. IMPRESSION: Changes of splenectomy and left upper quadrant percutaneous drainage again identified. Slight interval decrease in size of dominant collection within the left upper quadrant. High attenuation material and punctate foci of gas are nonspecific and may relate to recent catheter evaluation and/or flushing. Postsurgical changes of appendectomy with residual postoperative edema or inflammatory change within the greater omentum. No discrete drainable fluid collection identified. Small left pleural effusion, likely reactive. Associated bibasilar compressive atelectasis, left greater than right. Aortic Atherosclerosis (ICD10-I70.0). Electronically Signed   By: Fidela Salisbury M.D.   On: 01/23/2021 01:48   DG Chest Port 1 View  Result Date: 01/21/2021 CLINICAL DATA:  Shortness of breath EXAM: PORTABLE CHEST 1 VIEW COMPARISON:  Two days ago FINDINGS: Low volume chest with artifact from EKG leads. Left PICC with tip at the upper right atrium. Left upper quadrant drain. Normal heart size and stable mediastinal contours. Unchanged haziness of the left more than right chest with atelectasis and pleural fluid. IMPRESSION: Stable atelectasis  and pleural fluid asymmetric to the left. Electronically Signed   By: Jorje Guild M.D.   On: 01/21/2021 08:04    Labs:  CBC: Recent Labs    01/21/21 0131 01/22/21 0325 01/23/21 0337 01/24/21 0050  WBC 10.4 10.9* 11.9* 11.8*  HGB 8.6* 8.0* 8.2* 8.5*  HCT 28.0* 25.5* 26.7* 26.9*  PLT 552* 522* 542* 552*    COAGS: Recent Labs    01/01/21 1340 01/06/21 0308 01/06/21 0835  INR 1.1 1.3* 1.2  APTT  --   --  26     BMP: Recent Labs    01/21/21 0131 01/22/21 0325 01/23/21 0337 01/24/21 0050  NA 137 139 137 138  K 4.1 3.8 4.2 4.3  CL 104 106 105 103  CO2 26 26 26 25   GLUCOSE 175* 180* 158* 126*  BUN 25* 24* 24* 24*  CALCIUM 9.3 9.4 9.5 9.4  CREATININE 1.11 0.99 0.99 1.11  GFRNONAA >60 >60 >60 >60    LIVER FUNCTION TESTS: Recent Labs    01/21/21 0131 01/22/21 0325 01/23/21 0337 01/24/21 0050  BILITOT 0.8 0.8 0.7 0.7  AST 28 22 23 23   ALT 34 26 27 26   ALKPHOS 221* 181* 185* 182*  PROT 6.4* 6.4* 6.4* 6.4*  ALBUMIN 1.6* 1.5* 1.6* 1.7*    Assessment and Plan:  Drain Location: LUQ Size: Fr size: 12 Fr Date of placement: 10.25.22  Currently to: Drain collection device: suction bulb 24 hour output:  Output by Drain (mL) 01/22/21 0701 - 01/22/21 1900 01/22/21 1901 - 01/23/21 0700 01/23/21 0701 - 01/23/21 1900 01/23/21 1901 - 01/24/21 0700 01/24/21 0701 - 01/24/21 1644  Closed System Drain Left Abdomen Bulb (JP) 12 Fr. 5 10 10 10 10   Negative Pressure Wound Therapy Abdomen 25 30 25   0  Output dark brown in nature. 25 ml's of fluid noted to be in JP drain WBC is 11.8   Interval imaging/drain manipulation:  CT abd pelvis reads Slight interval decrease in size of dominant collection within the left upper quadrant  Current examination: Flushes/aspirates easily.  Insertion site unremarkable. Suture and stat lock in place. Dressed appropriately.   Plan: Continue TID flushes with 5 cc NS. Record output Q shift. Dressing changes QD or PRN if soiled.  Call IR APP or on call IR MD if difficulty flushing or sudden change in drain output.  Repeat imaging/possible drain injection once output < 10 mL/QD (excluding flush material.)  Discharge planning: Please contact IR APP or on call IR MD prior to patient d/c to ensure appropriate follow up plans are in place. Typically patient will follow up with IR clinic 10-14 days post d/c for repeat imaging/possible drain injection. IR  scheduler will contact patient with date/time of appointment. Patient will need to flush drain QD with 5 cc NS, record output QD, dressing changes every 2-3 days or earlier if soiled.   IR will continue to follow - please call with questions or concerns.   Electronically Signed: Jacqualine Mau, NP 01/24/2021, 4:44 PM   I spent a total of 15 Minutes at the patient's bedside AND on the patient's hospital floor or unit, greater than 50% of which was counseling/coordinating care for LUQ abscess drain

## 2021-01-24 NOTE — Progress Notes (Signed)
Occupational Therapy Treatment Patient Details Name: Travis [name] MRN: [id number] DOB: October 04, 1955 Today's Date: 01/24/2021   History of present illness Pt is a 65 y/o male admitted 10/9 with complaints of R lower quadrant abdominal pain. Found with intra-abdominal sepsis with perforated appendix and abdominal abscess; s/p diagnostic laparoscopy, ileocecectomy, drainage of interaabdominal abscess 10/9. Post op ileus. 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 Pt self extubated 10/16. L thoracentesis 10/25 with 600cc drained. PMHx: CHF, CKD, COPD, TIA, diabetes, HTN, gout, CAD.   OT comments  Patient supine in bed and requires max encouragement and increased time to engage in OT/PT session.  Completing grooming bed level with setup assist, bed mobility with supervision, sitting EOB to drink OJ with setup assist, total assist for LB ADLs, and then attempted standing with limited tolerance to back pain and self limiting/decreased cognition refusing further mobility.  Pt lateral scoots to [facility] with supervision.  He reports April is after October and is disoriented to time, presenting with poor awareness, problem solving and recall.  Will follow acutely. SNF remains appropriate.     Recommendations for follow up therapy are one component of a multi-disciplinary discharge planning process, led by the attending physician.  Recommendations may be updated based on patient status, additional functional criteria and insurance authorization.    Follow Up Recommendations  Skilled nursing-short term rehab (<3 hours/day)    Assistance Recommended at Discharge Frequent or constant Supervision/Assistance  Equipment Recommendations  Other (comment) (defer to next venue)    Recommendations for Other Services      Precautions / Restrictions Precautions Precautions: Fall Precaution Comments: wound vac and L drain in abdomen Restrictions Weight Bearing Restrictions: No       Mobility Bed  Mobility Overal bed mobility: Needs Assistance Bed Mobility: Supine to Sit;Sit to Supine Rolling:  (pt refused rolling)   Supine to sit: HOB elevated;Supervision   Sit to sidelying: Supervision General bed mobility comments: educated on moving to/from sit via sidelying with pt prefering to transition to/from supine    Transfers Overall transfer level: Needs assistance Equipment used: None Transfers: Lateral/Scoot Transfers            Lateral/Scoot Transfers: Supervision General transfer comment: pt initiated sit to stand x 1 (did not even clear buttocks) and reported increased pain and refused to attempt further; prior to return to supine, pt laterally scooted to his left toward [facility] with supervision (+cleared his buttocks easily)     Balance Overall balance assessment: Needs assistance Sitting-balance support: Feet supported;Bilateral upper extremity supported Sitting balance-Leahy Scale: Fair Sitting balance - Comments: leaning back on EOB                                   ADL either performed or assessed with clinical judgement   ADL Overall ADL's : Needs assistance/impaired Eating/Feeding: Set up;Sitting Eating/Feeding Details (indicate cue type and reason): to drink only Grooming: Wash/dry face;Set up;Bed level               Lower Body Dressing: Total assistance;Sitting/lateral leans     Toilet Transfer Details (indicate cue type and reason): pt declined         Functional mobility during ADLs: Supervision/safety General ADL Comments: limited to EOB only, pt declined further mobility     Vision       Perception     Praxis      Cognition Arousal/Alertness: Awake/alert Behavior During  Therapy: Flat affect Overall Cognitive Status: No family/caregiver present to determine baseline cognitive functioning Area of Impairment: Orientation;Attention;Following commands;Problem solving;Awareness;Memory;Safety/judgement                  Orientation Level: Disoriented to;Time (situation not tested) Current Attention Level: Sustained Memory: Decreased recall of precautions;Decreased short-term memory Following Commands: Follows one step commands consistently;Follows one step commands with increased time Safety/Judgement: Decreased awareness of deficits;Decreased awareness of safety Awareness: Intellectual Problem Solving: Decreased initiation;Slow processing;Requires verbal cues;Difficulty sequencing;Requires tactile cues General Comments: pt hyperfocused on pain, requires increased time for processing and intaiton of tasks, very self limiting with decreased cognition.  Voicing "april" is after october, when trying to orient pt to day.          Exercises General Exercises - Lower Extremity Ankle Circles/Pumps: Both;10 reps;Supine;AROM   Shoulder Instructions       General Comments see OT note re: ADLs completed    Pertinent Vitals/ Pain       Pain Assessment: Faces Faces Pain Scale: Hurts even more Pain Location: stomach, back Pain Descriptors / Indicators: Grimacing;Guarding Pain Intervention(s): Limited activity within patient's tolerance;Monitored during session;Repositioned;Premedicated before session  Home Living                                          Prior Functioning/Environment              Frequency  Min 2X/week        Progress Toward Goals  OT Goals(current goals can now be found in the care plan section)  Progress towards OT goals: Progressing toward goals  Acute Rehab OT Goals Time For Goal Achievement: 02/07/21 Potential to Achieve Goals: Good  Plan Discharge plan remains appropriate;Frequency remains appropriate    Co-evaluation    PT/OT/SLP Co-Evaluation/Treatment: Yes Reason for Co-Treatment: For patient/therapist safety;To address functional/ADL transfers   OT goals addressed during session: ADL's and self-care      AM-PAC OT "6 Clicks" Daily  Activity     Outcome Measure   Help from another person eating meals?: A Little Help from another person taking care of personal grooming?: A Little Help from another person toileting, which includes using toliet, bedpan, or urinal?: Total Help from another person bathing (including washing, rinsing, drying)?: A Lot Help from another person to put on and taking off regular upper body clothing?: A Little Help from another person to put on and taking off regular lower body clothing?: Total 6 Click Score: 13    End of Session    OT Visit Diagnosis: Other abnormalities of gait and mobility (R26.89);Muscle weakness (generalized) (M62.81);Pain;Other symptoms and signs involving cognitive function Pain - part of body:  (abodmen, back)   Activity Tolerance Patient limited by pain;Other (comment) (cognition- self limiting)   Patient Left in bed;with call bell/phone within reach;with bed alarm set;with SCD's reapplied   Nurse Communication Mobility status;Other (comment) (condom cath)        Time: [phone] OT Time Calculation (min): 36 min  Charges: OT General Charges $OT Visit: 1 Visit OT Treatments $Self Care/Home Management : 8-22 mins  Jolaine Artist, OT Acute Rehabilitation Services Pager [phone] Office [phone]   Delight Stare 01/24/2021, 9:59 AM

## 2021-01-24 NOTE — Progress Notes (Signed)
PROGRESS NOTE        PATIENT DETAILS Name: Travis [name] Age: 65 y.o. Sex: male Date of Birth: 10/29/1955 Admit Date: 01/01/2021 Admitting Physician Georganna Skeans, MD KJZ:PHXTA, Alyson Locket, NP  Brief Narrative: Patient is a 65 y.o. male with history of HFrEF, CKD stage III, COPD, HTN, DM-2, HLD, nonobstructive CAD-who presented with abdominal pain-found to have sepsis from ruptured appendicitis-underwent exploratory laparotomy/ileocecectomy and drainage of intra-abdominal abscess.Patient was briefly managed in the ICU-unfortunately-on 10/14 he developed severe abdominal pain -he was then found to have hemorrhagic shock due to a spontaneous splenic rupture-he was reevaluated by general surgery and underwent a splenectomy.  He was readmitted to the ICU-remained on the ventilator-he self extubated on 10/16-he was monitored closely-upon further stability-transfer to the [facility] hospitalist service on 10/20.  Significant events: 10/09>>admit- ex lap, remained intubated post op-to ICU 10/10>>extubated 10/12>> transferred to [facility] 10/14>>hypotensive/abd pain-CT showed spleenc rupture-to OR for splenectomy. 10/16>> Self-extubated  10/18>> Increased O2 requirement to 8L 10/19>> O2 weaned to 4L 10/20>> transferred to [facility]    Radiology/Echo 10/09>> CT abdomen: Acute appendicitis with perforation 10/14>> CT abdomen: Splenic rupture with large splenic hematoma 10/15>> TTE: EF 45-50% 10/20>> CT abdomen/pelvis: Indeterminate 2.4 cm fluid collection in the right lower quadrant adjacent to the cecal resection site.  Moderate left pleural effusion with compressive atelectasis. 10/24 >> CT - 1. Splenectomy with organizing complex fluid collections in the left upper quadrant. The largest collection there is 162 mL. Smaller adjacent collections, some inseparable from the gastric serosa and with associated mass effect and architectural distortion on the stomach. Again favor  hematoma/seroma. Abscess is not excluded, but there is no gas within the collections. 2. Stable small discontiguous rim enhancing fluid collections in the lower small bowel mesentery at the pelvic inlet a short distance from the percutaneous drain. These have not significantly changed and collectively are less than 10 mL  10/27 >> CT - 1. Interval placement of percutaneous pigtail drain catheter in the left upper quadrant within an air and fluid collection, the dominant component in which the catheter is placed measuring 7.9 x 6.6 cm, previously 8.5 x 6.2 cm. 2. Previously seen small, rim enhancing fluid collection in the right lower quadrant is not significantly changed, measuring 1.5 cm. 3. Redemonstrated postoperative findings status post appendectomy and splenectomy. There has been interval removal of a surgical drain in the right lower quadrant. 4. Cholelithiasis  10/31 CT >> Changes of splenectomy and left upper quadrant percutaneous drainage again identified. Slight interval decrease in size of dominant collection within the left upper quadrant. High attenuation material and punctate foci of gas are nonspecific and may relate to recent catheter evaluation and/or flushing. Postsurgical changes of appendectomy with residual postoperative edema or inflammatory change within the greater omentum. No discrete drainable fluid collection identified. Small left pleural effusion, likely reactive. Associated bibasilar compressive atelectasis, left greater than right. Aortic Atherosclerosis.      Subjective: Patient in bed, appears comfortable, denies any headache, no fever, no chest pain or pressure, no shortness of breath , mild abdominal pain. No new focal weakness.    Objective: Vitals: Blood pressure 120/71, pulse 98, temperature 98.4 F (36.9 C), temperature source Oral, resp. rate 18, height 6' (1.829 m), weight 90.7 kg, SpO2 90 %.   Exam:  Awake Alert, No new F.N deficits, Normal  affect [place].AT,PERRAL Supple Neck,  No JVD,   Symmetrical Chest wall movement, Good air movement bilaterally, CTAB RRR,No Gallops, Rubs or new Murmurs,  +ve B.Sounds, Abd Soft, W Vac and LUQ drain in place No Cyanosis, Clubbing or edema    Assessment/Plan:   Sepsis due to acute appendicitis with perforation/intra-abdominal abscesses and Bacteroides bacteremia (s/p laparotomy/ileocecectomy and drainage of intra-abdominal abscess on 10/9) is finished Zosyn course for bacteroides, now growing E. coli from 01/17/2021 in the abscess: Sepsis physiology has resolved -   postop he did well and antibiotics were stopped however on 01/16/2021 he started spiking fevers again and IR was consulted, CT scan was repeated, per general surgery he had another left upper quadrant drain placed by IR to drain questionable fluid collection.  Fluid growing E. coli.  1 out of 2 blood cultures contaminant with staph epidermis.  He is currently on IV Rocephin and Flagyl combination clinically improved, TNA was be stopped on 01/22/2021.  Continue oral diet.  General surgery following.  If any further decline per family and patient transition to full comfort care.   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  Acute hypoxic respiratory failure due to atelectasis/pleural effusion: Self extubated on 10/16-stable on anywhere from 2-4 L of oxygen-did develop some transient shortness of breath on 10/20-which responded to bronchodilators/diuretics.    Patient appears to have a moderate-sized left-sided pleural effusion which is likely transudative from-HFrEF/reactive to splenectomy.  PRN Lasix, also IR did ultrasound-guided thoracentesis on 01/17/2021 which was consistent with sympathetic exudative effusion gram stain negative cultures negative  Acute metabolic encephalopathy: Rapidly improving-continue supportive  care.  AKI on CKD stage IIIa: AKI hemodynamically mediated-hold Lasix, post hydration and packed RBC transfusion, improved.  HTN: BP stable-continue Coreg-losartan remains on hold  Nonischemic cardiomyopathy/HFrEF: Not in exacerbation-continue Lasix and monitor volume status closely.  Follow electrolytes.   Nonobstructive CAD: No anginal symptoms-already on beta-blocker-we will resume aspirin/statin over the next few days when more stable.  Hypomagnesemia - replaced  Anemia of chronic disease and acute disease.  1 unit of packed RBC transfusion on 01/18/2021, no signs of active bleeding, stable posttransfusion H&H.  HLD: Resume statin/fenofibrate when oral intake was stable.  COPD: Stable-continue bronchodilators  Functional quadriplegia/debility/deconditioning: Due to acute illness-continue PT/OT eval-either SNF or LTAC on discharge.  DM-2 (A1c 6.4 on 06/02/2020): CBGs stable-continue SSI  CBG (last 3)  Recent Labs    01/24/21 0035 01/24/21 0610 01/24/21 0741  GLUCAP 110* 130* 119*     Nutrition Status: Nutrition Problem: Moderate Malnutrition Etiology: acute illness, chronic illness (perforated appendix, CAD, cardiomyopathy) Signs/Symptoms: mild muscle depletion, mild fat depletion Interventions: Ensure Enlive (each supplement provides 350kcal and 20 grams of protein), TPN  Obesity: Estimated body mass index is 27.12 kg/m as calculated from the following:   Height as of this encounter: 6' (1.829 m).   Weight as of this encounter: 90.7 kg.    Procedures: 10/9>>exploratory laparotomy, ileocecectomy, drainage of intraabdominal abscess  10/14>> exploratory laparotomy and splenectomy 10/14>> left subclavian triple-lumen catheter 01/17/2021  >> left ultrasound-guided thoracentesis 600 cc exudative fluid, gram stain -ve 01/17/2021  >> LUQ JP drain by IR   Consults: General surgery, PCCM DVT Prophylaxis: Heparin Code Status:DNR Family Communication: Sister-in-law at  bedside on 01/15/21.  Niece [name] [phone] over the phone on 01/17/2021, 01/20/21     Time spent: 35 minutes-Greater than 50% of this time was spent in counseling, explanation of diagnosis, planning of further management,  and coordination of care.  Diet: Diet Order             Diet regular Room service appropriate? Yes; Fluid consistency: Thin  Diet effective now                    Disposition Plan: Status is: Inpatient  Remains inpatient appropriate because: Requires inpatient level of care.   Barriers to Discharge: Perforated appendix with pelvic abscess/splenic rupture-s/p laparotomy/splenectomy-not yet stable for discharge.     MEDICATIONS: Scheduled Meds:  (feeding supplement) PROSource Plus  30 mL Oral TID BM   acetaminophen  1,000 mg Oral Q6H   Chlorhexidine Gluconate Cloth  6 each Topical Daily   docusate sodium  100 mg Oral BID   feeding supplement  237 mL Oral TID BM   heparin injection (subcutaneous)  5,000 Units Subcutaneous Q8H    HYDROmorphone (DILAUDID) injection  0.5 mg Intravenous Once   insulin aspart  0-20 Units Subcutaneous Q6H   mouth rinse  15 mL Mouth Rinse BID   methocarbamol  500 mg Oral QID   metoprolol tartrate  25 mg Oral BID   metroNIDAZOLE  500 mg Oral Q12H   mirtazapine  7.5 mg Oral QHS   oxyCODONE  20 mg Oral Q12H   pantoprazole  40 mg Oral Daily   polyethylene glycol  17 g Oral Daily   Continuous Infusions:  cefTRIAXone (ROCEPHIN)  IV Stopped (01/23/21 1202)   PRN Meds:.bisacodyl, dextromethorphan, HYDROmorphone (DILAUDID) injection, hydroxypropyl methylcellulose / hypromellose, ipratropium-albuterol, ondansetron (ZOFRAN) IV, oxyCODONE, phenol, simethicone, sodium chloride, sodium chloride flush   I have personally reviewed following labs and imaging studies  LABORATORY DATA:  Recent Labs  Lab 01/20/21 0453 01/21/21 0131 01/22/21 0325 01/23/21 0337 01/24/21 0050  WBC 12.8* 10.4 10.9* 11.9* 11.8*  HGB 8.0* 8.6*  8.0* 8.2* 8.5*  HCT 25.0* 28.0* 25.5* 26.7* 26.9*  PLT 530* 552* 522* 542* 552*  MCV 91.9 94.0 94.8 93.7 92.8  MCH 29.4 28.9 29.7 28.8 29.3  MCHC 32.0 30.7 31.4 30.7 31.6  RDW 17.5* 17.2* 17.1* 16.8* 16.6*    Recent Labs  Lab 01/19/21 0226 01/20/21 0453 01/21/21 0131 01/22/21 0325 01/23/21 0337 01/24/21 0050  NA 139 139 137 139 137 138  K 4.2 3.9 4.1 3.8 4.2 4.3  CL 105 104 104 106 105 103  CO2 27 27 26 26 26 25   GLUCOSE 191* 124* 175* 180* 158* 126*  BUN 34* 28* 25* 24* 24* 24*  CREATININE 1.46* 1.20 1.11 0.99 0.99 1.11  CALCIUM 9.1 9.2 9.3 9.4 9.5 9.4  AST 45* 41 28 22 23 23   ALT 42 42 34 26 27 26   ALKPHOS 226* 241* 221* 181* 185* 182*  BILITOT 1.3* 1.0 0.8 0.8 0.7 0.7  ALBUMIN 1.5* 1.6* 1.6* 1.5* 1.6* 1.7*  MG 2.2  --   --   --  1.5* 1.8  PROCALCITON 0.41 0.34 0.26 0.21 0.23 0.25  BNP 177.6* 185.7* 124.9* 77.8 73.6  --      RADIOLOGY STUDIES/RESULTS: CT ABDOMEN PELVIS WO CONTRAST  Result Date: 01/23/2021 CLINICAL DATA:  Abdominal abscess, severe abdominal pain, EXAM: CT ABDOMEN AND PELVIS WITHOUT CONTRAST TECHNIQUE: Multidetector CT imaging of the abdomen and pelvis was performed following the standard protocol without IV contrast. COMPARISON:  None. FINDINGS: Lower chest: Small left pleural effusion. Bibasilar atelectasis, left greater than right. Moderate multi-vessel coronary artery calcification. Central venous catheter tip noted within the superior cavoatrial junction. Hepatobiliary: No focal liver abnormality is seen. No  gallstones, gallbladder wall thickening, or biliary dilatation. Pancreas: Unremarkable Spleen: Status post splenectomy. Percutaneous drainage catheter is again seen within the splenectomy resection bed with mixed attenuation material surrounding the catheter likely representing hemorrhagic components or residual contrast from drainage catheter placement or evaluation. Punctate foci of gas within this region are nonspecific and may relate to flushing  of the catheter or evaluation. When measured in similar fashion, the dominant collection measures 5.5 x 7.4 cm, slightly decreased in size since prior examination. Adrenals/Urinary Tract: Adrenal glands are unremarkable. Kidneys are normal, without renal calculi, focal lesion, or hydronephrosis. Bladder is unremarkable. Stomach/Bowel: Appendectomy has been performed. Stomach, small bowel, and large bowel are otherwise unremarkable. No gross free intraperitoneal gas. Mild infiltration of the greater omentum is unchanged may reflect postsurgical or residual inflammatory change. No loculated intra-abdominal fluid collections. No free intrapelvic fluid. Vascular/Lymphatic: Aortic atherosclerosis. No enlarged abdominal or pelvic lymph nodes. Reproductive: Streak artifact from right total hip arthroplasty. Partially obscures pelvic organs. Visualized prostate gland is unremarkable. Other: Laparotomy incision with wound VAC device placed within the a superficial aspect of the incision again noted. Punctate foci of gas noted within the right lower quadrant anterior abdominal wall. Mild subcutaneous edema noted within the flanks bilaterally in keeping with mild anasarca. Musculoskeletal: Right total hip arthroplasty has been performed. No acute bone abnormality. Degenerative changes are seen within the lumbar spine. IMPRESSION: Changes of splenectomy and left upper quadrant percutaneous drainage again identified. Slight interval decrease in size of dominant collection within the left upper quadrant. High attenuation material and punctate foci of gas are nonspecific and may relate to recent catheter evaluation and/or flushing. Postsurgical changes of appendectomy with residual postoperative edema or inflammatory change within the greater omentum. No discrete drainable fluid collection identified. Small left pleural effusion, likely reactive. Associated bibasilar compressive atelectasis, left greater than right. Aortic  Atherosclerosis (ICD10-I70.0). Electronically Signed   By: Fidela Salisbury M.D.   On: 01/23/2021 01:48     LOS: 23 days   Signature  Lala Lund M.D on 01/24/2021 at 11:04 AM   -  To page go to [email or web address]

## 2021-01-24 NOTE — TOC Progression Note (Signed)
Transition of Care [facility]) - Progression Note    Patient Details  Name: KAIZER DISSINGER MRN: [id number] Date of Birth: 1956-03-05  Transition of Care [facility]) CM/SW [place], LCSW Phone Number: 01/24/2021, 12:12 PM  Clinical Narrative:    [facility] has received peer to peer request. CSW provided info to MD and he contacted them to schedule it ([phone], x [phone]).    Expected Discharge Plan: Long Term Acute Care (LTAC) Barriers to Discharge: Continued Medical Work up, Orthoptist and Services Expected Discharge Plan: Long Term Acute Care (LTAC) In-house Referral: Clinical Social Work   Post Acute Care Choice: Long Term Acute Care (LTAC) Living arrangements for the past 2 months: Single Family Home                                       Social Determinants of Health (SDOH) Interventions    Readmission Risk Interventions No flowsheet data found.

## 2021-01-24 NOTE — [place] FL2 (Signed)
[place] MEDICAID FL2 LEVEL OF CARE SCREENING TOOL     IDENTIFICATION  Patient Name: Travis [name] Birthdate: 1955-07-03 Sex: male Admission Date (Current Location): 01/01/2021  [facility] and [place] Number:  Herbalist and Address:  [facility] [place], [place] [address], [place], [place] [address]      Provider Number: [id number]  Attending Physician Name and Address:  Thurnell Lose, MD  Relative Name and Phone Number:       Current Level of Care: Hospital Recommended Level of Care: [place] Prior Approval Number:    Date Approved/Denied:   PASRR Number: [id number]  Discharge Plan: SNF    Current Diagnoses: Patient Active Problem List   Diagnosis Date Noted   Malnutrition of moderate degree 01/05/2021   Ruptured appendicitis 01/01/2021   Sepsis ([place]) 01/01/2021   Status post surgery 01/01/2021   Protein-calorie malnutrition, severe 05/27/2020   Hypotension 05/26/2020   AKI (acute kidney injury) ([place]) 05/26/2020   Alcohol use 11/20/2019   CKD (chronic kidney disease), stage III ([place]) 11/20/2019   TIA (transient ischemic attack) 11/08/2019   Osteoarthritis 05/09/2016   COPD (chronic obstructive pulmonary disease) ([place]) 05/09/2016   Essential hypertension 04/18/2015   Literacy level of illiterate 06/04/2014   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   Cardiomyopathy, nonischemic ([place]) 61/95/0932   Chronic systolic heart failure ([place]) 01/05/2011   Tobacco use 01/05/2011   Diabetes ([place]) 11/30/2008   HLD (hyperlipidemia) 11/30/2008   Gout 11/30/2008    Orientation RESPIRATION BLADDER Height & Weight     Self, Place  Normal Incontinent, External catheter Weight: 199 lb 15.3 oz (90.7 kg) Height:  6' (182.9 cm)  BEHAVIORAL SYMPTOMS/MOOD NEUROLOGICAL BOWEL NUTRITION STATUS      Continent Diet (see dc summary)  AMBULATORY STATUS COMMUNICATION OF NEEDS Skin   Extensive Assist Verbally Surgical wounds, Wound  Vac (Closed incision on abdomen; Will require wound vac to be ordered at [facility])                       [place] Level of Assistance  Bathing, Feeding, Dressing Bathing Assistance: Maximum assistance Feeding assistance: Maximum assistance Dressing Assistance: Maximum assistance     Functional Limitations Info             [place]  PT (By licensed PT), OT (By licensed OT)     PT Frequency: 5x/week OT Frequency: 5x/week            Contractures Contractures Info: Not present    Additional Factors Info  Code Status, Allergies, Insulin Sliding Scale Code Status Info: DNR Allergies Info: NKA   Insulin Sliding Scale Info: See dc summary       Current Medications (01/24/2021):  This is the current hospital active medication list Current Facility-Administered Medications  Medication Dose Route Frequency Provider Last Rate Last Admin   (feeding supplement) PROSource Plus liquid 30 mL  30 mL Oral TID BM Thurnell Lose, MD   30 mL at 01/24/21 0827   acetaminophen (TYLENOL) tablet 1,000 mg  1,000 mg Oral Q6H Saverio Danker, PA-C   1,000 mg at 01/24/21 1044   bisacodyl (DULCOLAX) suppository 10 mg  10 mg Rectal Daily PRN Meuth, Brooke A, PA-C       cefTRIAXone (ROCEPHIN) 2 g in sodium chloride 0.9 % 100 mL IVPB  2 g Intravenous Q24H Thurnell Lose, MD   Stopped at 01/23/21 1202   Chlorhexidine  Gluconate Cloth 2 % PADS 6 each  6 each Topical Daily Georganna Skeans, MD   6 each at 01/24/21 1045   dextromethorphan (DELSYM) 30 MG/5ML liquid 15 mg  15 mg Per Tube QHS PRN Simonne Maffucci B, MD   15 mg at 01/09/21 2053   docusate sodium (COLACE) capsule 100 mg  100 mg Oral BID Meuth, Brooke A, PA-C   100 mg at 01/24/21 0826   feeding supplement (ENSURE ENLIVE / ENSURE PLUS) liquid 237 mL  237 mL Oral TID BM Thurnell Lose, MD   237 mL at 01/24/21 1045   heparin injection 5,000 Units  5,000 Units Subcutaneous Q8H Monia Sabal, PA-C   5,000  Units at 01/24/21 5400   HYDROmorphone (DILAUDID) injection 0.5 mg  0.5 mg Intravenous Q6H PRN Romana Juniper A, MD   0.5 mg at 01/24/21 0438   HYDROmorphone (DILAUDID) injection 0.5 mg  0.5 mg Intravenous Once Alcario Drought, Jared M, DO       hydroxypropyl methylcellulose / hypromellose (ISOPTO TEARS / GONIOVISC) 2.5 % ophthalmic solution 1 drop  1 drop Both Eyes TID PRN Marcelyn Bruins, MD       insulin aspart (novoLOG) injection 0-20 Units  0-20 Units Subcutaneous Q6H [place]e Jude, [facility]   3 Units at 01/24/21 8676   ipratropium-albuterol (DUONEB) 0.5-2.5 (3) MG/3ML nebulizer solution 3 mL  3 mL Nebulization Q6H PRN Thurnell Lose, MD   3 mL at 01/17/21 0147   MEDLINE mouth rinse  15 mL Mouth Rinse BID Maryjane Hurter, MD   15 mL at 01/24/21 1044   methocarbamol (ROBAXIN) tablet 500 mg  500 mg Oral QID Saverio Danker, PA-C   500 mg at 01/24/21 1950   metoprolol tartrate (LOPRESSOR) tablet 25 mg  25 mg Oral BID Thurnell Lose, MD   25 mg at 01/24/21 9326   metroNIDAZOLE (FLAGYL) tablet 500 mg  500 mg Oral Q12H Thurnell Lose, MD   500 mg at 01/24/21 7124   mirtazapine (REMERON) tablet 7.5 mg  7.5 mg Oral QHS Rosezella Rumpf, NP   7.5 mg at 01/23/21 2052   ondansetron (ZOFRAN) injection 4 mg  4 mg Intravenous Q6H PRN Norm Parcel, PA-C   4 mg at 01/24/21 5809   oxyCODONE (Oxy IR/ROXICODONE) immediate release tablet 5 mg  5 mg Oral Q6H PRN Romana Juniper A, MD   5 mg at 01/24/21 9833   oxyCODONE (OXYCONTIN) 12 hr tablet 20 mg  20 mg Oral Q12H Rosezella Rumpf, NP   20 mg at 01/24/21 0825   pantoprazole (PROTONIX) EC tablet 40 mg  40 mg Oral Daily Thurnell Lose, MD   40 mg at 01/24/21 0826   phenol (CHLORASEPTIC) mouth spray 1 spray  1 spray Mouth/Throat PRN Elsie Lincoln, MD   1 spray at 01/09/21 2305   polyethylene glycol (MIRALAX / GLYCOLAX) packet 17 g  17 g Oral Daily Meuth, Brooke A, PA-C   17 g at 01/24/21 0827   simethicone (MYLICON) chewable tablet 80 mg  80 mg Oral  QID PRN Etta Quill, DO   80 mg at 01/22/21 2058   sodium chloride (OCEAN) 0.65 % nasal spray 1 spray  1 spray Each Nare PRN Thurnell Lose, MD   1 spray at 01/20/21 0829   sodium chloride flush (NS) 0.9 % injection 10-40 mL  10-40 mL Intracatheter PRN Juanito Doom, MD   10 mL at 01/20/21 0455     Discharge  Medications: Please see discharge summary for a list of discharge medications.  Relevant Imaging Results:  Relevant Lab Results:   Additional Information SSN: [id number]. [place] COVID-19 Vaccine 07/04/2019 , 06/13/2019  Benard [place], LCSW

## 2021-01-25 DIAGNOSIS — Z515 Encounter for palliative care: Secondary | ICD-10-CM | POA: Diagnosis not present

## 2021-01-25 LAB — PROCALCITONIN: Procalcitonin: 0.22 ng/mL

## 2021-01-25 LAB — GLUCOSE, CAPILLARY
Glucose-Capillary: 115 mg/dL — ABNORMAL HIGH (ref 70–99)
Glucose-Capillary: 120 mg/dL — ABNORMAL HIGH (ref 70–99)
Glucose-Capillary: 145 mg/dL — ABNORMAL HIGH (ref 70–99)
Glucose-Capillary: 156 mg/dL — ABNORMAL HIGH (ref 70–99)

## 2021-01-25 LAB — COMPREHENSIVE METABOLIC PANEL
ALT: 23 U/L (ref 0–44)
AST: 22 U/L (ref 15–41)
Albumin: 1.8 g/dL — ABNORMAL LOW (ref 3.5–5.0)
Alkaline Phosphatase: 146 U/L — ABNORMAL HIGH (ref 38–126)
Anion gap: 8 (ref 5–15)
BUN: 22 mg/dL (ref 8–23)
CO2: 25 mmol/L (ref 22–32)
Calcium: 9.5 mg/dL (ref 8.9–10.3)
Chloride: 102 mmol/L (ref 98–111)
Creatinine, Ser: 1.18 mg/dL (ref 0.61–1.24)
GFR, Estimated: >60 mL/min (ref >60)
Glucose, Bld: 120 mg/dL — ABNORMAL HIGH (ref 70–99)
Potassium: 3.7 mmol/L (ref 3.5–5.1)
Sodium: 135 mmol/L (ref 135–145)
Total Bilirubin: 0.5 mg/dL (ref 0.3–1.2)
Total Protein: 6.5 g/dL (ref 6.5–8.1)

## 2021-01-25 LAB — CBC
HCT: 26.8 % — ABNORMAL LOW (ref 39.0–52.0)
Hemoglobin: 8.4 g/dL — ABNORMAL LOW (ref 13.0–17.0)
MCH: 28.9 pg (ref 26.0–34.0)
MCHC: 31.3 g/dL (ref 30.0–36.0)
MCV: 92.1 fL (ref 80.0–100.0)
Platelets: 552 10*3/uL — ABNORMAL HIGH (ref 150–400)
RBC: 2.91 MIL/uL — ABNORMAL LOW (ref 4.22–5.81)
RDW: 16.3 % — ABNORMAL HIGH (ref 11.5–15.5)
WBC: 11.3 10*3/uL — ABNORMAL HIGH (ref 4.0–10.5)
nRBC: 0.4 % — ABNORMAL HIGH (ref 0.0–0.2)

## 2021-01-25 LAB — MAGNESIUM: Magnesium: 1.5 mg/dL — ABNORMAL LOW (ref 1.7–2.4)

## 2021-01-25 MED ORDER — BISACODYL 10 MG RE SUPP
10.0000 mg | Freq: Once | RECTAL | Status: DC
  Filled 2021-01-25: qty 1

## 2021-01-25 MED ORDER — MAGNESIUM SULFATE 2 GM/50ML IV SOLN
2.0000 g | Freq: Once | INTRAVENOUS | Status: AC
  Administered 2021-01-25: 2 g via INTRAVENOUS
  Filled 2021-01-25: qty 50

## 2021-01-25 NOTE — Progress Notes (Addendum)
PROGRESS NOTE        PATIENT DETAILS Name: Travis [name] Age: 65 y.o. Sex: male Date of Birth: 12-17-1955 Admit Date: 01/01/2021 Admitting Physician Georganna Skeans, MD MWU:[id number], Alyson Locket, NP  Brief Narrative: Patient is a 65 y.o. male with history of HFrEF, CKD stage III, COPD, HTN, DM-2, HLD, nonobstructive CAD-who presented with abdominal pain-found to have sepsis from ruptured appendicitis-underwent exploratory laparotomy/ileocecectomy and drainage of intra-abdominal abscess.Patient was briefly managed in the ICU-unfortunately-on 10/14 he developed severe abdominal pain -he was then found to have hemorrhagic shock due to a spontaneous splenic rupture-he was reevaluated by general surgery and underwent a splenectomy.  He was readmitted to the ICU-remained on the ventilator-he self extubated on 10/16-he was monitored closely-upon further stability-transfer to the [facility] hospitalist service on 10/20.  Significant events: 10/09>>admit- ex lap, remained intubated post op-to ICU 10/10>>extubated 10/12>> transferred to [facility] 10/14>>hypotensive/abd pain-CT showed spleenc rupture-to OR for splenectomy. 10/16>> Self-extubated  10/18>> Increased O2 requirement to 8L 10/19>> O2 weaned to 4L 10/20>> transferred to [facility]    Radiology/Echo 10/09>> CT abdomen: Acute appendicitis with perforation 10/14>> CT abdomen: Splenic rupture with large splenic hematoma 10/15>> TTE: EF 45-50% 10/20>> CT abdomen/pelvis: Indeterminate 2.4 cm fluid collection in the right lower quadrant adjacent to the cecal resection site.  Moderate left pleural effusion with compressive atelectasis. 10/24 >> CT - 1. Splenectomy with organizing complex fluid collections in the left upper quadrant. The largest collection there is 162 mL. Smaller adjacent collections, some inseparable from the gastric serosa and with associated mass effect and architectural distortion on the stomach. Again favor  hematoma/seroma. Abscess is not excluded, but there is no gas within the collections. 2. Stable small discontiguous rim enhancing fluid collections in the lower small bowel mesentery at the pelvic inlet a short distance from the percutaneous drain. These have not significantly changed and collectively are less than 10 mL  10/27 >> CT - 1. Interval placement of percutaneous pigtail drain catheter in the left upper quadrant within an air and fluid collection, the dominant component in which the catheter is placed measuring 7.9 x 6.6 cm, previously 8.5 x 6.2 cm. 2. Previously seen small, rim enhancing fluid collection in the right lower quadrant is not significantly changed, measuring 1.5 cm. 3. Redemonstrated postoperative findings status post appendectomy and splenectomy. There has been interval removal of a surgical drain in the right lower quadrant. 4. Cholelithiasis  10/31 CT >> Changes of splenectomy and left upper quadrant percutaneous drainage again identified. Slight interval decrease in size of dominant collection within the left upper quadrant. High attenuation material and punctate foci of gas are nonspecific and may relate to recent catheter evaluation and/or flushing. Postsurgical changes of appendectomy with residual postoperative edema or inflammatory change within the greater omentum. No discrete drainable fluid collection identified. Small left pleural effusion, likely reactive. Associated bibasilar compressive atelectasis, left greater than right. Aortic Atherosclerosis.      Subjective: Patient in bed, appears comfortable, denies any headache, no fever, no chest pain or pressure, no shortness of breath ,  mild abdominal pain. No new focal weakness.   Objective: Vitals: Blood pressure 118/79, pulse (!) 101, temperature 98.2 F (36.8 C), temperature source Axillary, resp. rate 19, height 6' (1.829 m), weight 86.8 kg, SpO2 100 %.   Exam:  Awake Alert, No new F.N deficits, Normal  affect Tensas.AT,PERRAL Supple  Neck, No JVD,   Symmetrical Chest wall movement, Good air movement bilaterally, CTAB RRR,No Gallops, Rubs or new Murmurs,  +ve B.Sounds, Abd Soft,   W Vac and LUQ drain in place  No Cyanosis, Clubbing or edema     Assessment/Plan:   Sepsis due to acute appendicitis with perforation/intra-abdominal abscesses and Bacteroides bacteremia (s/p laparotomy/ileocecectomy and drainage of intra-abdominal abscess on 10/9) is finished Zosyn course for bacteroides, now growing E. coli from 01/17/2021 in the abscess: Sepsis physiology has resolved -   postop he did well and antibiotics were stopped however on 01/16/2021 he started spiking fevers again and IR was consulted, CT scan was repeated, per general surgery he had another left upper quadrant drain placed by IR to drain questionable fluid collection.  Fluid growing E. coli.  1 out of 2 blood cultures contaminant with staph epidermis.  He is currently on IV Rocephin and Flagyl combination clinically improved, TNA was be stopped on 01/22/2021 and he is currently tolerating oral diet.  General surgery following.    Plan will be to discharge him once bed is available on oral/IV antibiotics for another 3 weeks with outpatient general surgery IR follow-up, has PICC, if any further decline per family and patient transition to full comfort care, plan discussed with family multiple times.   Addendum - Had a peer to peer conversation with Dr Sondra Come from the [facility] on 01/25/21, I explained to Dr. Nicoletta Dress my concerns that patient is extremely frail, has had multiple abdominal surgeries complicated by residual abscess and fluid collection, still has abdominal wound VAC and a left upper quadrant drain and requires another 3 to 4 weeks of antibiotics most likely IV due to lack of oral options.  I also expressed my concerns that patient might not do well at an SNF due to intensity of care and to kindly approve him for an LTAC facility for  higher level of care and supervision with an experienced wound care team, onsite surgeon or a visiting surgeon and more frequent physician visits.  I also relayed my feelings that if patient goes to SNF and declines as per previous plan he will be transition to palliative care and he will pass away.  I think the only slim chance of getting better is that he goes to an LTAC for higher level of care as compared to SNF.  Unfortunately I was unable to persuade Dr. Nicoletta Dress to approving an LTAC.    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  Acute hypoxic respiratory failure due to atelectasis/pleural effusion: Self extubated on 10/16-stable on anywhere from 2-4 L of oxygen-did develop some transient shortness of breath on 10/20-which responded to bronchodilators/diuretics.    Patient appears to have a moderate-sized left-sided pleural effusion which is likely transudative from-HFrEF/reactive to splenectomy.  PRN Lasix, also IR did ultrasound-guided thoracentesis on 01/17/2021 which was consistent with sympathetic exudative effusion gram stain negative cultures negative  Acute metabolic encephalopathy: Rapidly improving-continue supportive care.  AKI on CKD stage IIIa: AKI hemodynamically mediated-hold Lasix, post hydration and packed RBC transfusion, improved.  HTN: BP stable-continue Coreg-losartan remains on hold  Nonischemic cardiomyopathy/HFrEF: Not in exacerbation-continue Lasix and monitor volume status closely.  Follow electrolytes.   Nonobstructive CAD: No anginal symptoms-already on beta-blocker-we will resume aspirin/statin over the next few days when more stable.  Hypomagnesemia - replaced  Anemia of chronic disease and acute disease.  1 unit of packed  RBC transfusion on 01/18/2021, no signs of active bleeding, stable posttransfusion H&H.  HLD: Resume statin/fenofibrate when oral  intake was stable.  COPD: Stable-continue bronchodilators  Functional quadriplegia/debility/deconditioning: Due to acute illness-continue PT/OT eval-either SNF or LTAC on discharge.  DM-2 (A1c 6.4 on 06/02/2020): CBGs stable-continue SSI  CBG (last 3)  Recent Labs    01/24/21 2015 01/25/21 0039 01/25/21 0618  GLUCAP 162* 115* 120*     Nutrition Status: Nutrition Problem: Moderate Malnutrition Etiology: acute illness, chronic illness (perforated appendix, CAD, cardiomyopathy) Signs/Symptoms: mild muscle depletion, mild fat depletion Interventions: Ensure Enlive (each supplement provides 350kcal and 20 grams of protein), TPN  Obesity: Estimated body mass index is 25.95 kg/m as calculated from the following:   Height as of this encounter: 6' (1.829 m).   Weight as of this encounter: 86.8 kg.    Procedures: 10/9>>exploratory laparotomy, ileocecectomy, drainage of intraabdominal abscess  10/14>> exploratory laparotomy and splenectomy 10/14>> left subclavian triple-lumen catheter 01/17/2021  >> left ultrasound-guided thoracentesis 600 cc exudative fluid, gram stain -ve 01/17/2021  >> LUQ JP drain by IR   Consults: General surgery, PCCM DVT Prophylaxis: Heparin Code Status:DNR Family Communication: Sister-in-law at bedside on 01/15/21.  Niece [name] [phone] over the phone on 01/17/2021, 01/20/21, 01/25/21  Time spent: 35 minutes-Greater than 50% of this time was spent in counseling, explanation of diagnosis, planning of further management, and coordination of care.  Diet: Diet Order             Diet regular Room service appropriate? Yes; Fluid consistency: Thin  Diet effective now                    Disposition Plan: Status is: Inpatient  Remains inpatient appropriate because: Requires inpatient level of care.   Barriers to Discharge: Perforated appendix with pelvic abscess/splenic rupture-s/p laparotomy/splenectomy-not yet stable for  discharge.  MEDICATIONS: Scheduled Meds:  (feeding supplement) PROSource Plus  30 mL Oral TID BM   acetaminophen  1,000 mg Oral Q6H   Chlorhexidine Gluconate Cloth  6 each Topical Daily   docusate sodium  100 mg Oral BID   feeding supplement  237 mL Oral TID BM   heparin injection (subcutaneous)  5,000 Units Subcutaneous Q8H    HYDROmorphone (DILAUDID) injection  0.5 mg Intravenous Once   insulin aspart  0-20 Units Subcutaneous Q6H   mouth rinse  15 mL Mouth Rinse BID   methocarbamol  500 mg Oral QID   metoprolol tartrate  25 mg Oral BID   metroNIDAZOLE  500 mg Oral Q12H   mirtazapine  7.5 mg Oral QHS   oxyCODONE  20 mg Oral Q12H   pantoprazole  40 mg Oral Daily   polyethylene glycol  17 g Oral Daily   Continuous Infusions:  cefTRIAXone (ROCEPHIN)  IV 2 g (01/24/21 1159)   PRN Meds:.bisacodyl, dextromethorphan, HYDROmorphone (DILAUDID) injection, hydroxypropyl methylcellulose / hypromellose, ipratropium-albuterol, ondansetron (ZOFRAN) IV, oxyCODONE, phenol, simethicone, sodium chloride, sodium chloride flush   I have personally reviewed following labs and imaging studies  LABORATORY DATA:  Recent Labs  Lab 01/21/21 0131 01/22/21 0325 01/23/21 0337 01/24/21 0050 01/25/21 0520  WBC 10.4 10.9* 11.9* 11.8* 11.3*  HGB 8.6* 8.0* 8.2* 8.5* 8.4*  HCT 28.0* 25.5* 26.7* 26.9* 26.8*  PLT 552* 522* 542* 552* 552*  MCV 94.0 94.8 93.7 92.8 92.1  MCH 28.9 29.7 28.8 29.3 28.9  MCHC 30.7 31.4 30.7 31.6 31.3  RDW 17.2* 17.1* 16.8* 16.6* 16.3*    Recent Labs  Lab 01/19/21 0226  01/20/21 0453 01/21/21 0131 01/22/21 0325 01/23/21 0337 01/24/21 0050 01/25/21 0520  NA 139 139 137 139 137 138 135  K 4.2 3.9 4.1 3.8 4.2 4.3 3.7  CL 105 104 104 106 105 103 102  CO2 27 27 26 26 26 25 25   GLUCOSE 191* 124* 175* 180* 158* 126* 120*  BUN 34* 28* 25* 24* 24* 24* 22  CREATININE 1.46* 1.20 1.11 0.99 0.99 1.11 1.18  CALCIUM 9.1 9.2 9.3 9.4 9.5 9.4 9.5  AST 45* 41 28 22 23 23 22   ALT 42  42 34 26 27 26 23   ALKPHOS 226* 241* 221* 181* 185* 182* 146*  BILITOT 1.3* 1.0 0.8 0.8 0.7 0.7 0.5  ALBUMIN 1.5* 1.6* 1.6* 1.5* 1.6* 1.7* 1.8*  MG 2.2  --   --   --  1.5* 1.8 1.5*  PROCALCITON 0.41 0.34 0.26 0.21 0.23 0.25 0.22  BNP 177.6* 185.7* 124.9* 77.8 73.6  --   --      RADIOLOGY STUDIES/RESULTS: No results found.   LOS: 24 days   Signature -  Lala Lund M.D on 01/25/2021 at 9:26 AM   -  To page go to [email or web address]

## 2021-01-25 NOTE — Progress Notes (Signed)
Patient ID: Travis [name], male   DOB: 02-26-1956, 65 y.o.   MRN: [id number] [facility] Progress Note  19 Days Post-Op  Subjective: CC-  No new issues. Abdominal pain about the same. Denies n/v. Last BM 3 days ago. He is passing flatus. Ate about 1/3 of his breakfast.  Objective: Vital signs in last 24 hours: Temp:  [98.2 F (36.8 C)-99.2 F (37.3 C)] 98.2 F (36.8 C) (11/02 0727) Pulse Rate:  [93-107] 101 (11/02 0727) Resp:  [15-22] 19 (11/02 0727) BP: (109-122)/(68-81) 118/79 (11/02 0727) SpO2:  [87 %-100 %] 100 % (11/02 0727) Weight:  [86.8 kg] 86.8 kg (11/02 0400) Last BM Date: 01/23/21  Intake/Output from previous day: 11/01 0701 - 11/02 0700 In: 0  Out: 1163 [Urine:1150; Drains:13] Intake/Output this shift: No intake/output data recorded.  PE: Gen:  Alert, NAD, pleasant Pulm:  rate and effort normal Abd: Soft, mild distension, mild diffuse TTP without rebound or guarding, +BS, vac to midline incision with good seal, LUQ drain with dark bloody fluid  Lab Results:  Recent Labs    01/24/21 0050 01/25/21 0520  WBC 11.8* 11.3*  HGB 8.5* 8.4*  HCT 26.9* 26.8*  PLT 552* 552*   BMET Recent Labs    01/24/21 0050 01/25/21 0520  NA 138 135  K 4.3 3.7  CL 103 102  CO2 25 25  GLUCOSE 126* 120*  BUN 24* 22  CREATININE 1.11 1.18  CALCIUM 9.4 9.5   PT/INR No results for input(s): LABPROT, INR in the last 72 hours. CMP     Component Value Date/Time   NA 135 01/25/2021 0520   NA 138 06/20/2020 1034   NA 141 04/20/2014 1019   K 3.7 01/25/2021 0520   K 3.7 04/20/2014 1019   CL 102 01/25/2021 0520   CL 106 04/20/2014 1019   CO2 25 01/25/2021 0520   CO2 30 04/20/2014 1019   GLUCOSE 120 (H) 01/25/2021 0520   GLUCOSE 102 (H) 04/20/2014 1019   BUN 22 01/25/2021 0520   BUN 18 06/20/2020 1034   BUN 14 04/20/2014 1019   CREATININE 1.18 01/25/2021 0520   CREATININE 1.18 04/20/2014 1019   CALCIUM 9.5 01/25/2021 0520   CALCIUM 9.3 04/20/2014 1019    PROT 6.5 01/25/2021 0520   PROT 6.5 06/18/2017 1425   PROT 7.4 04/20/2014 1019   ALBUMIN 1.8 (L) 01/25/2021 0520   ALBUMIN 4.0 06/18/2017 1425   ALBUMIN 3.4 04/20/2014 1019   AST 22 01/25/2021 0520   AST 21 04/20/2014 1019   ALT 23 01/25/2021 0520   ALT 20 04/20/2014 1019   ALKPHOS 146 (H) 01/25/2021 0520   ALKPHOS 97 04/20/2014 1019   BILITOT 0.5 01/25/2021 0520   BILITOT 0.4 06/18/2017 1425   BILITOT 0.6 04/20/2014 1019   GFRNONAA >60 01/25/2021 0520   GFRNONAA >60 04/20/2014 1019   GFRNONAA >60 11/19/2012 1107   GFRAA 53 (L) 01/13/2020 1410   GFRAA >60 04/20/2014 1019   GFRAA >60 11/19/2012 1107   Lipase     Component Value Date/Time   LIPASE 31 01/01/2021 1023   LIPASE 112 04/20/2014 1019       Studies/Results: No results found.  Anti-infectives: Anti-infectives (From admission, onward)    Start     Dose/Rate Route Frequency Ordered Stop   01/19/21 1200  cefTRIAXone (ROCEPHIN) 2 g in sodium chloride 0.9 % 100 mL IVPB        2 g 200 mL/hr over 30 Minutes Intravenous Every 24 hours 01/19/21  1056     01/19/21 1145  metroNIDAZOLE (FLAGYL) tablet 500 mg        500 mg Oral Every 12 hours 01/19/21 1056     01/18/21 0615  vancomycin (VANCOREADY) IVPB 1250 mg/250 mL  Status:  Discontinued        1,250 mg 166.7 mL/hr over 90 Minutes Intravenous Every 24 hours 01/18/21 0517 01/19/21 1400   01/17/21 1330  Ampicillin-Sulbactam (UNASYN) 3 g in sodium chloride 0.9 % 100 mL IVPB  Status:  Discontinued        3 g 200 mL/hr over 30 Minutes Intravenous Every 8 hours 01/17/21 1239 01/19/21 1056   01/09/21 1100  piperacillin-tazobactam (ZOSYN) IVPB 3.375 g  Status:  Discontinued        3.375 g 12.5 mL/hr over 240 Minutes Intravenous Every 8 hours 01/09/21 1000 01/12/21 0835   01/09/21 0830  ceFEPIme (MAXIPIME) 2 g in sodium chloride 0.9 % 100 mL IVPB  Status:  Discontinued        2 g 200 mL/hr over 30 Minutes Intravenous Every 12 hours 01/09/21 0737 01/09/21 1000   01/08/21  1400  metroNIDAZOLE (FLAGYL) IVPB 500 mg  Status:  Discontinued        500 mg 100 mL/hr over 60 Minutes Intravenous Every 12 hours 01/08/21 1059 01/09/21 1000   01/08/21 1400  ceFEPIme (MAXIPIME) 2 g in sodium chloride 0.9 % 100 mL IVPB  Status:  Discontinued        2 g 200 mL/hr over 30 Minutes Intravenous Every 24 hours 01/08/21 1114 01/09/21 0737   01/08/21 1000  vancomycin (VANCOREADY) IVPB 750 mg/150 mL  Status:  Discontinued        750 mg 150 mL/hr over 60 Minutes Intravenous Every 24 hours 01/07/21 2052 01/09/21 1000   01/08/21 0600  vancomycin (VANCOREADY) IVPB 1500 mg/300 mL  Status:  Discontinued        1,500 mg 150 mL/hr over 120 Minutes Intravenous Every 24 hours 01/07/21 0430 01/07/21 1210   01/08/21 0600  vancomycin (VANCOREADY) IVPB 750 mg/150 mL  Status:  Discontinued        750 mg 150 mL/hr over 60 Minutes Intravenous Every 24 hours 01/07/21 1210 01/07/21 2052   01/07/21 1400  piperacillin-tazobactam (ZOSYN) IVPB 3.375 g  Status:  Discontinued        3.375 g 12.5 mL/hr over 240 Minutes Intravenous Every 8 hours 01/07/21 0430 01/08/21 1114   01/07/21 0530  piperacillin-tazobactam (ZOSYN) IVPB 3.375 g        3.375 g 100 mL/hr over 30 Minutes Intravenous STAT 01/07/21 0430 01/07/21 0622   01/07/21 0530  vancomycin (VANCOREADY) IVPB 1750 mg/350 mL        1,750 mg 175 mL/hr over 120 Minutes Intravenous  Once 01/07/21 0430 01/07/21 0841   01/05/21 1430  Ampicillin-Sulbactam (UNASYN) 3 g in sodium chloride 0.9 % 100 mL IVPB        3 g 200 mL/hr over 30 Minutes Intravenous Every 6 hours 01/05/21 1334 01/06/21 1552   01/01/21 2200  piperacillin-tazobactam (ZOSYN) IVPB 3.375 g        3.375 g 12.5 mL/hr over 240 Minutes Intravenous Every 8 hours 01/01/21 1841 01/04/21 2359   01/01/21 2100  piperacillin-tazobactam (ZOSYN) IVPB 3.375 g  Status:  Discontinued        3.375 g 100 mL/hr over 30 Minutes Intravenous Every 8 hours 01/01/21 1836 01/01/21 1841   01/01/21 1345   piperacillin-tazobactam (ZOSYN) IVPB 3.375 g  3.375 g 100 mL/hr over 30 Minutes Intravenous  Once 01/01/21 1337 01/01/21 1430        Assessment/Plan POD 24, s/p exploratory laparotomy, ileocecectomy, drainage of intraabdominal abscess 01/01/21 Dr. Grandville Silos for perforated appendicitis with abscess - Drain removed 10/24 - VAC change M/W/F to midline - Continue PT/OT, SNF vs LTACH  - on a diet and having bowel function but not eating much. Continue bowel regimen. Dulcolax suppository today 11/2. Encourage PO intake   POD 19, s/p ex lap, splenectomy, application of incisional wound vac 10/14. Dr. Bobbye Morton for splenic laceration - CT 10/24 with complex LUQ fluid collection - s/p IR drain 10/25 yielding dark bloody fluid, culture with E coli resistant to unasyn - switched to Rocephin/ flagyl 10/27.  - post-splenectomy vaccines given 10/28 - CT scan 10/31 showed improving LUQ fluid collection with drain in place, no new acute findings  - WBC down 11.3 from 11.8, afebrile, monitor - Continue drain and antibiotics as above for now, planning for 3 weeks oral antibiotics at discharge. Per IR repeat imaging/possible drain injection once output < 10 mL/QD (excluding flush material.) Awaiting bed at SNF vs [facility] Palliative team is following. "If further decline per family and patient transition to full comfort care."    FEN: TNA stopped 10/30, reg diet VTE: SQH  ID: Zosyn 10/9>> 10/20, unasyn 10/25>>10/27, rocephin/flagyl 10/27>>, vancomycin 10/26>>10/27   Below per primary team: hemorrhagic shock 2/2 above s/p 6 u PRBC, 2 u FFP 10/14 Septic shock 2/2 perforated appendicitis  EtOH abuse Alcohol related cardiomyopathy T2DM Chronic anemia Hx of TIA HTN HLD Gout Hx of GIB CAD Tobacco abuse L pleural effusion s/p thoracentesis 10/25   LOS: 24 days    Wellington Hampshire, [facility] Surgery 01/25/2021, 9:40 AM Please see Amion for pager number during day hours  7:00am-4:30pm

## 2021-01-25 NOTE — Consult Note (Signed)
[place] Nurse wound follow up Patient receiving care in [facility] 5W30 Wound type: Midline abdominal surgical incision Measurement: 19.5 x 3.2 x 1.5 Pressure Injury POA: NA  Wound bed: healthy pink granulation tissue. Healing at the point of removing the NPWT Drainage (amount, consistency, odor) sanguinous in canister Periwound: Intact Dressing procedure/placement/frequency: 2 pieces of black foam removed. 2 pieces of black foam used to fill the wound bed which extends down to the pubic hair line. Medium size Kellie Simmering # [phone]) Drape applied and immediate suction obtained at 125 mmHg. Canister Kellie Simmering # [phone]). Use NS to loosen foam and premedicate prior to dressing change. Patient tolerated the procedure well today. M/W/F vac change. WOC will follow. 2 dressing kits at bedside.    Cathlean Marseilles [name], MSN, RN, [place], Lysle Pearl, [facility] Wound Treatment Associate Pager [phone]

## 2021-01-25 NOTE — TOC Progression Note (Signed)
Transition of Care [facility]) - Progression Note    Patient Details  Name: GREOGORY CORNETTE MRN: [id number] Date of Birth: 1956-03-13  Transition of Care [facility]) CM/SW [place], LCSW Phone Number: 01/25/2021, 3:10 PM  Clinical Narrative:    CSW received denial from [facility] peer to peer per MD. Per [facility], they may can do an appeal while CSW arranges SNF just in case.   CSW spoke with patient's niece, Danelle Earthly, and made her aware of denial and that patient's only current bed offer is [facility]. She reported understanding. CSW will ascertain if patient will require IV antibiotics at discharge and SNF would need to order wound vac and obtain insurance approval.   Expected Discharge Plan: Long Term Acute Care (LTAC) Barriers to Discharge: Continued Medical Work up, Orthoptist and Services Expected Discharge Plan: [place] (LTAC) In-house Referral: Clinical Social Work   Post Acute Care Choice: Long Term Acute Care (LTAC) Living arrangements for the past 2 months: Single Family Home                                       Social Determinants of Health (SDOH) Interventions    Readmission Risk Interventions No flowsheet data found.

## 2021-01-25 NOTE — Progress Notes (Signed)
Referring Physician(s): Dr. Bobbye Morton  Supervising Physician: Sandi Mariscal  Patient Status:  [facility] - In-pt  Chief Complaint: S/p appendectomy and partial cecectomy with spontaneous splenic rupture requiring splenectomy. Found to a=have a LUQ abscess. IR placed a LUQ abscess drain on 10.25.25 by Dr. Laurence Ferrari  Subjective:  Patient laying in bed calm denies any overt complaints. He expressed concern regarding the keys his niece had and  is requesting a glass of water.   Allergies: Patient has no known allergies.  Medications: Prior to Admission medications   Medication Sig Start Date End Date Taking? Authorizing Provider  albuterol (VENTOLIN HFA) 108 (90 Base) MCG/ACT inhaler Inhale 2 puffs into the lungs every 6 (six) hours as needed for wheezing or shortness of breath.   Yes [provider]  allopurinol (ZYLOPRIM) 100 MG tablet Take 1 tablet (100 mg total) by mouth 2 (two) times daily. 12/19/20  Yes [name], NP  aspirin 325 MG tablet Take 162.5 mg by mouth in the morning.   Yes [provider]  atorvastatin (LIPITOR) 20 MG tablet Take 20 mg by mouth daily.   Yes [provider]  carvedilol (COREG) 6.25 MG tablet Take 1 tablet (6.25 mg total) by mouth 2 (two) times daily. 06/03/20  Yes Theora Gianotti, NP  cetirizine (ZYRTEC) 10 MG tablet Take 1 tablet (10 mg total) by mouth daily. 07/07/20  Yes [name], NP  fenofibrate (TRICOR) 145 MG tablet TAKE 1 TABLET BY MOUTH ONCE A DAY 11/23/20  Yes Dutch Quint B, FNP  ferrous sulfate 325 (65 FE) MG tablet Take 1 tablet (325 mg total) by mouth daily with breakfast. 06/02/20  Yes Baity, Coralie Keens, NP  fluticasone (FLONASE) 50 MCG/ACT nasal spray Place 1-2 sprays into both nostrils daily as needed for allergies or rhinitis.   Yes [provider]  furosemide (LASIX) 40 MG tablet Take 1 tablet by mouth daily.   Yes [provider]  hydroxypropyl methylcellulose / hypromellose (ISOPTO  TEARS / GONIOVISC) 2.5 % ophthalmic solution Place 1 drop into both eyes 3 (three) times daily as needed for dry eyes.   Yes [provider]  losartan (COZAAR) 25 MG tablet Take 0.5 tablets (12.5 mg total) by mouth daily. 12/19/20 03/19/21 Yes [name], NP  magnesium oxide (MAG-OX) 400 MG tablet Take 1 tablet (400 mg total) by mouth daily. 12/19/20  Yes [name], NP  multivitamin (ONE-A-DAY MEN'S) TABS tablet Take 1 tablet by mouth daily with breakfast.   Yes [provider]  Omega-3 Fatty Acids (FISH OIL PO) Take 1 capsule by mouth daily.   Yes [provider]  pantoprazole (PROTONIX) 40 MG tablet Take 1 tablet (40 mg total) by mouth daily. 06/02/20  Yes Baity, Coralie Keens, NP  triamcinolone cream (KENALOG) 0.1 % APPLY TO AFFECTED AREAS TWICE A DAY AS NEEDED. AVOID FACE, GROIN, OR UNDERARMS. Patient taking differently: Apply 1 application topically 2 (two) times daily as needed (dry skin, rash). 11/01/20  Yes Dutch Quint B, FNP     Vital Signs: BP 134/71 (BP Location: Right Arm)   Pulse 96   Temp 98.4 F (36.9 C) (Oral)   Resp 17   Ht 6' (1.829 m)   Wt 191 lb 5.8 oz (86.8 kg)   SpO2 90%   BMI 25.95 kg/m   Physical Exam Vitals and nursing note reviewed.  Constitutional:      Appearance: He is well-developed. He is ill-appearing.  HENT:  Head: Normocephalic.  Pulmonary:     Effort: Pulmonary effort is normal.  Abdominal:     Comments: Positive LUQ drain to suction. Site is unremarkable with no erythema, edema, tenderness, bleeding or drainage noted at exit site. Suture and stat lock in place. Dressing is clean dry and intact. 10 ml of  serosanguinous colored fluid noted in bulb suction device. Drain is able to be flushed easily.    Musculoskeletal:        General: Normal range of motion.     Cervical back: Normal range of motion.  Skin:    General: Skin is dry.  Neurological:     Mental Status: He is alert and oriented to person, place, and  time.    Imaging: CT ABDOMEN PELVIS WO CONTRAST  Result Date: 01/23/2021 CLINICAL DATA:  Abdominal abscess, severe abdominal pain, EXAM: CT ABDOMEN AND PELVIS WITHOUT CONTRAST TECHNIQUE: Multidetector CT imaging of the abdomen and pelvis was performed following the standard protocol without IV contrast. COMPARISON:  None. FINDINGS: Lower chest: Small left pleural effusion. Bibasilar atelectasis, left greater than right. Moderate multi-vessel coronary artery calcification. Central venous catheter tip noted within the superior cavoatrial junction. Hepatobiliary: No focal liver abnormality is seen. No gallstones, gallbladder wall thickening, or biliary dilatation. Pancreas: Unremarkable Spleen: Status post splenectomy. Percutaneous drainage catheter is again seen within the splenectomy resection bed with mixed attenuation material surrounding the catheter likely representing hemorrhagic components or residual contrast from drainage catheter placement or evaluation. Punctate foci of gas within this region are nonspecific and may relate to flushing of the catheter or evaluation. When measured in similar fashion, the dominant collection measures 5.5 x 7.4 cm, slightly decreased in size since prior examination. Adrenals/Urinary Tract: Adrenal glands are unremarkable. Kidneys are normal, without renal calculi, focal lesion, or hydronephrosis. Bladder is unremarkable. Stomach/Bowel: Appendectomy has been performed. Stomach, small bowel, and large bowel are otherwise unremarkable. No gross free intraperitoneal gas. Mild infiltration of the greater omentum is unchanged may reflect postsurgical or residual inflammatory change. No loculated intra-abdominal fluid collections. No free intrapelvic fluid. Vascular/Lymphatic: Aortic atherosclerosis. No enlarged abdominal or pelvic lymph nodes. Reproductive: Streak artifact from right total hip arthroplasty. Partially obscures pelvic organs. Visualized prostate gland is  unremarkable. Other: Laparotomy incision with wound VAC device placed within the a superficial aspect of the incision again noted. Punctate foci of gas noted within the right lower quadrant anterior abdominal wall. Mild subcutaneous edema noted within the flanks bilaterally in keeping with mild anasarca. Musculoskeletal: Right total hip arthroplasty has been performed. No acute bone abnormality. Degenerative changes are seen within the lumbar spine. IMPRESSION: Changes of splenectomy and left upper quadrant percutaneous drainage again identified. Slight interval decrease in size of dominant collection within the left upper quadrant. High attenuation material and punctate foci of gas are nonspecific and may relate to recent catheter evaluation and/or flushing. Postsurgical changes of appendectomy with residual postoperative edema or inflammatory change within the greater omentum. No discrete drainable fluid collection identified. Small left pleural effusion, likely reactive. Associated bibasilar compressive atelectasis, left greater than right. Aortic Atherosclerosis (ICD10-I70.0). Electronically Signed   By: Fidela Salisbury M.D.   On: 01/23/2021 01:48    Labs:  CBC: Recent Labs    01/22/21 0325 01/23/21 0337 01/24/21 0050 01/25/21 0520  WBC 10.9* 11.9* 11.8* 11.3*  HGB 8.0* 8.2* 8.5* 8.4*  HCT 25.5* 26.7* 26.9* 26.8*  PLT 522* 542* 552* 552*    COAGS: Recent Labs    01/01/21 1340 01/06/21 0308 01/06/21  2952  INR 1.1 1.3* 1.2  APTT  --   --  26    BMP: Recent Labs    01/22/21 0325 01/23/21 0337 01/24/21 0050 01/25/21 0520  NA 139 137 138 135  K 3.8 4.2 4.3 3.7  CL 106 105 103 102  CO2 26 26 25 25   GLUCOSE 180* 158* 126* 120*  BUN 24* 24* 24* 22  CALCIUM 9.4 9.5 9.4 9.5  CREATININE 0.99 0.99 1.11 1.18  GFRNONAA >60 >60 >60 >60    LIVER FUNCTION TESTS: Recent Labs    01/22/21 0325 01/23/21 0337 01/24/21 0050 01/25/21 0520  BILITOT 0.8 0.7 0.7 0.5  AST 22 23 23 22    ALT 26 27 26 23   ALKPHOS 181* 185* 182* 146*  PROT 6.4* 6.4* 6.4* 6.5  ALBUMIN 1.5* 1.6* 1.7* 1.8*    Assessment and Plan:  Drain Location: LUQ Size: Fr size: 12 Fr Date of placement: 10.25.22  Currently to: Drain collection device: suction bulb 24 hour output:  Output by Drain (mL) 01/23/21 0701 - 01/23/21 1900 01/23/21 1901 - 01/24/21 0700 01/24/21 0701 - 01/24/21 1900 01/24/21 1901 - 01/25/21 0700 01/25/21 0701 - 01/25/21 1706  Closed System Drain Left Abdomen Bulb (JP) 12 Fr. 10 10 10 3 15   Negative Pressure Wound Therapy Abdomen 25  0    Cultures show E. Colu  Interval imaging/drain manipulation:  None since CT abd pelvis from 10.31.22 reads Slight interval decrease in size of dominant collection within the left upper quadrant  Current examination: Flushes/aspirates easily.  Insertion site unremarkable. Suture and stat lock in place. Dressed appropriately.   Plan: Continue TID flushes with 5 cc NS. Record output Q shift. Dressing changes QD or PRN if soiled.  Call IR APP or on call IR MD if difficulty flushing or sudden change in drain output.  Repeat imaging/possible drain injection once output < 10 mL/QD (excluding flush material.)  Discharge planning: Please contact IR APP or on call IR MD prior to patient d/c to ensure appropriate follow up plans are in place. Typically patient will follow up with IR clinic 10-14 days post d/c for repeat imaging/possible drain injection. IR scheduler will contact patient with date/time of appointment. Patient will need to flush drain QD with 5 cc NS, record output QD, dressing changes every 2-3 days or earlier if soiled.   IR will continue to follow - please call with questions or concerns.    Electronically Signed: Jacqualine Mau, NP 01/25/2021, 5:03 PM   I spent a total of 15 Minutes at the patient's bedside AND on the patient's hospital floor or unit, greater than 50% of which was counseling/coordinating care for LUQ  abdominal abscess drain.

## 2021-01-26 DIAGNOSIS — K3532 Acute appendicitis with perforation and localized peritonitis, without abscess: Secondary | ICD-10-CM | POA: Diagnosis not present

## 2021-01-26 DIAGNOSIS — Z9889 Other specified postprocedural states: Secondary | ICD-10-CM

## 2021-01-26 LAB — GLUCOSE, CAPILLARY
Glucose-Capillary: 120 mg/dL — ABNORMAL HIGH (ref 70–99)
Glucose-Capillary: 131 mg/dL — ABNORMAL HIGH (ref 70–99)
Glucose-Capillary: 99 mg/dL (ref 70–99)

## 2021-01-26 LAB — CBC
HCT: 28 % — ABNORMAL LOW (ref 39.0–52.0)
Hemoglobin: 8.6 g/dL — ABNORMAL LOW (ref 13.0–17.0)
MCH: 28.5 pg (ref 26.0–34.0)
MCHC: 30.7 g/dL (ref 30.0–36.0)
MCV: 92.7 fL (ref 80.0–100.0)
Platelets: 582 10*3/uL — ABNORMAL HIGH (ref 150–400)
RBC: 3.02 MIL/uL — ABNORMAL LOW (ref 4.22–5.81)
RDW: 16.5 % — ABNORMAL HIGH (ref 11.5–15.5)
WBC: 10.5 10*3/uL (ref 4.0–10.5)
nRBC: 0.5 % — ABNORMAL HIGH (ref 0.0–0.2)

## 2021-01-26 LAB — COMPREHENSIVE METABOLIC PANEL
ALT: 23 U/L (ref 0–44)
AST: 25 U/L (ref 15–41)
Albumin: 1.9 g/dL — ABNORMAL LOW (ref 3.5–5.0)
Alkaline Phosphatase: 151 U/L — ABNORMAL HIGH (ref 38–126)
Anion gap: 7 (ref 5–15)
BUN: 22 mg/dL (ref 8–23)
CO2: 26 mmol/L (ref 22–32)
Calcium: 9.6 mg/dL (ref 8.9–10.3)
Chloride: 104 mmol/L (ref 98–111)
Creatinine, Ser: 1.14 mg/dL (ref 0.61–1.24)
GFR, Estimated: >60 mL/min (ref >60)
Glucose, Bld: 93 mg/dL (ref 70–99)
Potassium: 3.8 mmol/L (ref 3.5–5.1)
Sodium: 137 mmol/L (ref 135–145)
Total Bilirubin: 0.7 mg/dL (ref 0.3–1.2)
Total Protein: 6.8 g/dL (ref 6.5–8.1)

## 2021-01-26 LAB — PROCALCITONIN: Procalcitonin: 0.23 ng/mL

## 2021-01-26 LAB — MAGNESIUM: Magnesium: 1.8 mg/dL (ref 1.7–2.4)

## 2021-01-26 MED ORDER — ALTEPLASE 2 MG IJ SOLR
2.0000 mg | Freq: Once | INTRAMUSCULAR | Status: AC
  Administered 2021-01-26: 2 mg
  Filled 2021-01-26: qty 2

## 2021-01-26 MED ORDER — ADULT MULTIVITAMIN W/MINERALS CH
1.0000 | ORAL_TABLET | Freq: Every day | ORAL | Status: DC
  Administered 2021-01-26 – 2021-02-09 (×15): 1 via ORAL
  Filled 2021-01-26 (×15): qty 1

## 2021-01-26 NOTE — Progress Notes (Signed)
Referring Physician(s): CCS  Supervising Physician: Jacqulynn Cadet  Patient Status:  [facility] - In-pt  Chief Complaint:  Status post appendectomy and partial cecectomy complicated by spontaneous splenic rupture necessitating splenectomy, further complicated by intraabdominal fluid collection in subdiaphragmatic space with an associated left-sided pleural effusion.  S/p thoracentesis and LUQ drain placement with IR on 10/25   Subjective:  Pt laying in bed, states that he is not doing very well. Appears to be lethargic.  NT at bedside, states patient went in to V tach for a brief moment after going to restroom, attending was notified. Patient's VS currently in baseline, in sinus tach O2 sat stable.   Patient denies palpitation, chest pain, light headedness, and shortness of breath.  Reports mild abdominal discomfort, denies N/V.  Allergies: Patient has no known allergies.  Medications: Prior to Admission medications   Medication Sig Start Date End Date Taking? Authorizing Provider  albuterol (VENTOLIN HFA) 108 (90 Base) MCG/ACT inhaler Inhale 2 puffs into the lungs every 6 (six) hours as needed for wheezing or shortness of breath.   Yes [provider]  allopurinol (ZYLOPRIM) 100 MG tablet Take 1 tablet (100 mg total) by mouth 2 (two) times daily. 12/19/20  Yes [name], NP  aspirin 325 MG tablet Take 162.5 mg by mouth in the morning.   Yes [provider]  atorvastatin (LIPITOR) 20 MG tablet Take 20 mg by mouth daily.   Yes [provider]  carvedilol (COREG) 6.25 MG tablet Take 1 tablet (6.25 mg total) by mouth 2 (two) times daily. 06/03/20  Yes Theora Gianotti, NP  cetirizine (ZYRTEC) 10 MG tablet Take 1 tablet (10 mg total) by mouth daily. 07/07/20  Yes [name], NP  fenofibrate (TRICOR) 145 MG tablet TAKE 1 TABLET BY MOUTH ONCE A DAY 11/23/20  Yes Dutch Quint B, FNP  ferrous sulfate 325 (65 FE) MG tablet Take 1 tablet (325 mg  total) by mouth daily with breakfast. 06/02/20  Yes Baity, Coralie Keens, NP  fluticasone (FLONASE) 50 MCG/ACT nasal spray Place 1-2 sprays into both nostrils daily as needed for allergies or rhinitis.   Yes [provider]  furosemide (LASIX) 40 MG tablet Take 1 tablet by mouth daily.   Yes [provider]  hydroxypropyl methylcellulose / hypromellose (ISOPTO TEARS / GONIOVISC) 2.5 % ophthalmic solution Place 1 drop into both eyes 3 (three) times daily as needed for dry eyes.   Yes [provider]  losartan (COZAAR) 25 MG tablet Take 0.5 tablets (12.5 mg total) by mouth daily. 12/19/20 03/19/21 Yes [name], NP  magnesium oxide (MAG-OX) 400 MG tablet Take 1 tablet (400 mg total) by mouth daily. 12/19/20  Yes [name], NP  multivitamin (ONE-A-DAY MEN'S) TABS tablet Take 1 tablet by mouth daily with breakfast.   Yes [provider]  Omega-3 Fatty Acids (FISH OIL PO) Take 1 capsule by mouth daily.   Yes [provider]  pantoprazole (PROTONIX) 40 MG tablet Take 1 tablet (40 mg total) by mouth daily. 06/02/20  Yes Baity, Coralie Keens, NP  triamcinolone cream (KENALOG) 0.1 % APPLY TO AFFECTED AREAS TWICE A DAY AS NEEDED. AVOID FACE, GROIN, OR UNDERARMS. Patient taking differently: Apply 1 application topically 2 (two) times daily as needed (dry skin, rash). 11/01/20  Yes Dutch Quint B, FNP     Vital Signs: BP 119/74 (BP Location: Right Arm)   Pulse 99   Temp 98.7 F (37.1 C) (Oral)  Resp 20   Ht 6' (1.829 m)   Wt 199 lb 4.7 oz (90.4 kg)   SpO2 92%   BMI 27.03 kg/m   Physical Exam Vitals reviewed.  Constitutional:      General: He is not in acute distress.    Appearance: He is ill-appearing.     Comments: Lethargic  HENT:     Head: Normocephalic and atraumatic.  Cardiovascular:     Rate and Rhythm: Normal rate and regular rhythm.  Pulmonary:     Effort: Pulmonary effort is normal.  Abdominal:     General: Abdomen is flat.     Palpations:  Abdomen is soft.  Skin:    General: Skin is warm and dry.     Coloration: Skin is not cyanotic or jaundiced.     Comments: Positive LUQ drain to asuction bulb. Site is unremarkable with no erythema, edema, tenderness, bleeding or drainage. Suture and stat lock in place. Dressing is clean, dry, and intact. 25 ml of  brown colored fluid noted in the bulb. Drain aspirates and flushes well.    Neurological:     Mental Status: He is oriented to person, place, and time.  Psychiatric:        Mood and Affect: Mood normal.        Behavior: Behavior normal.    Imaging: CT ABDOMEN PELVIS WO CONTRAST  Result Date: 01/23/2021 CLINICAL DATA:  Abdominal abscess, severe abdominal pain, EXAM: CT ABDOMEN AND PELVIS WITHOUT CONTRAST TECHNIQUE: Multidetector CT imaging of the abdomen and pelvis was performed following the standard protocol without IV contrast. COMPARISON:  None. FINDINGS: Lower chest: Small left pleural effusion. Bibasilar atelectasis, left greater than right. Moderate multi-vessel coronary artery calcification. Central venous catheter tip noted within the superior cavoatrial junction. Hepatobiliary: No focal liver abnormality is seen. No gallstones, gallbladder wall thickening, or biliary dilatation. Pancreas: Unremarkable Spleen: Status post splenectomy. Percutaneous drainage catheter is again seen within the splenectomy resection bed with mixed attenuation material surrounding the catheter likely representing hemorrhagic components or residual contrast from drainage catheter placement or evaluation. Punctate foci of gas within this region are nonspecific and may relate to flushing of the catheter or evaluation. When measured in similar fashion, the dominant collection measures 5.5 x 7.4 cm, slightly decreased in size since prior examination. Adrenals/Urinary Tract: Adrenal glands are unremarkable. Kidneys are normal, without renal calculi, focal lesion, or hydronephrosis. Bladder is unremarkable.  Stomach/Bowel: Appendectomy has been performed. Stomach, small bowel, and large bowel are otherwise unremarkable. No gross free intraperitoneal gas. Mild infiltration of the greater omentum is unchanged may reflect postsurgical or residual inflammatory change. No loculated intra-abdominal fluid collections. No free intrapelvic fluid. Vascular/Lymphatic: Aortic atherosclerosis. No enlarged abdominal or pelvic lymph nodes. Reproductive: Streak artifact from right total hip arthroplasty. Partially obscures pelvic organs. Visualized prostate gland is unremarkable. Other: Laparotomy incision with wound VAC device placed within the a superficial aspect of the incision again noted. Punctate foci of gas noted within the right lower quadrant anterior abdominal wall. Mild subcutaneous edema noted within the flanks bilaterally in keeping with mild anasarca. Musculoskeletal: Right total hip arthroplasty has been performed. No acute bone abnormality. Degenerative changes are seen within the lumbar spine. IMPRESSION: Changes of splenectomy and left upper quadrant percutaneous drainage again identified. Slight interval decrease in size of dominant collection within the left upper quadrant. High attenuation material and punctate foci of gas are nonspecific and may relate to recent catheter evaluation and/or flushing. Postsurgical changes of appendectomy with residual postoperative  edema or inflammatory change within the greater omentum. No discrete drainable fluid collection identified. Small left pleural effusion, likely reactive. Associated bibasilar compressive atelectasis, left greater than right. Aortic Atherosclerosis (ICD10-I70.0). Electronically Signed   By: Fidela Salisbury M.D.   On: 01/23/2021 01:48    Labs:  CBC: Recent Labs    01/23/21 0337 01/24/21 0050 01/25/21 0520 01/26/21 0346  WBC 11.9* 11.8* 11.3* 10.5  HGB 8.2* 8.5* 8.4* 8.6*  HCT 26.7* 26.9* 26.8* 28.0*  PLT 542* 552* 552* 582*    COAGS: Recent  Labs    01/01/21 1340 01/06/21 0308 01/06/21 0835  INR 1.1 1.3* 1.2  APTT  --   --  26    BMP: Recent Labs    01/23/21 0337 01/24/21 0050 01/25/21 0520 01/26/21 0346  NA 137 138 135 137  K 4.2 4.3 3.7 3.8  CL 105 103 102 104  CO2 26 25 25 26   GLUCOSE 158* 126* 120* 93  BUN 24* 24* 22 22  CALCIUM 9.5 9.4 9.5 9.6  CREATININE 0.99 1.11 1.18 1.14  GFRNONAA >60 >60 >60 >60    LIVER FUNCTION TESTS: Recent Labs    01/23/21 0337 01/24/21 0050 01/25/21 0520 01/26/21 0346  BILITOT 0.7 0.7 0.5 0.7  AST 23 23 22 25   ALT 27 26 23 23   ALKPHOS 185* 182* 146* 151*  PROT 6.4* 6.4* 6.5 6.8  ALBUMIN 1.6* 1.7* 1.8* 1.9*    Assessment and Plan:  65 y.o. male status post appendectomy and partial cecectomy complicated by spontaneous splenic rupture necessitating splenectomy, further complicated by intraabdominal fluid collection in subdiaphragmatic space with an associated left-sided pleural effusion.  S/p thoracentesis and LUQ drain placement with IR on 10/25.  VSS WBC in normal range  Drain Location: LUQ Size: Fr size: 12 Fr Date of placement: 01/17/21   Currently to: Drain collection device: suction bulb 24 hour output:  Output by Drain (mL) 01/24/21 0701 - 01/24/21 1900 01/24/21 1901 - 01/25/21 0700 01/25/21 0701 - 01/25/21 1900 01/25/21 1901 - 01/26/21 0700 01/26/21 0701 - 01/26/21 1207  Closed System Drain Left Abdomen Bulb (JP) 12 Fr. 10 3 15 12    Negative Pressure Wound Therapy Abdomen 0   60     Interval imaging/drain manipulation:  None   Current examination: Flushes/aspirates easily.  Insertion site unremarkable. Suture and stat lock in place. Dressed appropriately.   Plan: Continue TID flushes with 5 cc NS. Record output Q shift. Dressing changes QD or PRN if soiled.  Call IR APP or on call IR MD if difficulty flushing or sudden change in drain output.  Repeat imaging/possible drain injection once output < 10 mL/QD (excluding flush material.)  Discharge  planning: Please contact IR APP or on call IR MD prior to patient d/c to ensure appropriate follow up plans are in place. Typically patient will follow up with IR clinic 10-14 days post d/c for repeat imaging/possible drain injection. IR scheduler will contact patient with date/time of appointment. Patient will need to flush drain QD with 5 cc NS, record output QD, dressing changes every 2-3 days or earlier if soiled.   IR will continue to follow - please call with questions or concerns.  Electronically Signed: Tera Mater, PA-C 01/26/2021, 10:56 AM   I spent a total of 25 Minutes at the the patient's bedside AND on the patient's hospital floor or unit, greater than 50% of which was counseling/coordinating care for LUQ drain   This chart was dictated using voice recognition software.  Despite best efforts to proofread,  errors can occur which can change the documentation meaning.

## 2021-01-26 NOTE — Progress Notes (Signed)
Pt QTC ranging from 480's-520's; Was told this is not a new issue from [employer] RN who is familiar with pt. Messaged On call just to let them know. Pt is resting in bed vitals are stable.

## 2021-01-26 NOTE — Progress Notes (Signed)
TRH night cross cover note:  Was notified by RN of patient's QTC on telemetry ranging from the 480s to 520s ms, with charge nurse able to confirm that this is finding is not new, but rather has been been consistent during pt's hospital course.  Patient currently asymptomatic, with stable vital signs.  Per chart review, most recent prior EKG demonstrated QTC of 505 ms, consistent with the reported above range demonstrated on telemetry.  Of note, it appears that the patient has had some intermittent hypomagnesemia during this hospitalization, including serum magnesium of 1.5 on 01/25/21 , with improvement to 1.8 following interval IV magnesium sulfate supplementation.  There is an existing order for repeat serum magnesium level, with result currently pending.    Babs Bertin, DO Hospitalist

## 2021-01-26 NOTE — Progress Notes (Signed)
Nutrition Follow-up  DOCUMENTATION CODES:   Non-severe (moderate) malnutrition in context of chronic illness  INTERVENTION:   Continue Ensure Enlive po TID, each supplement provides 350 kcal and 20 grams of protein  Continue 30 ml ProSource Plus TID, each supplement provides 100 kcals and 15 grams protein.   Discontinue Magic Cup  Multivitamin w/ minerals daily  Encourage good PO intake  NUTRITION DIAGNOSIS:   Moderate Malnutrition related to acute illness, chronic illness (perforated appendix, CAD, cardiomyopathy) as evidenced by mild muscle depletion, mild fat depletion. - Ongoing   GOAL:   Patient will meet greater than or equal to 90% of their needs - Progressing   MONITOR:   PO intake, Supplement acceptance, I & O's, Labs  REASON FOR ASSESSMENT:   Consult Assessment of nutrition requirement/status  ASSESSMENT:   65 yo male admitted with perforated appendix and abdominal abscess. S/P ex lap, ileocecectomy, drainage of intra-abdominal abscess. PMH includes CHF, CKD 3A, COPD, TIA, DM, HTN, gout, HLD, CAD, alcohol abuse, tobacco abuse.  10/9- s/p ex lap, ileocectomy, drainage of intra-abdominal abscess 10/11- s/p ex lap, splenectomy, and incisional wound vac placement 10/16- extubated, TPN initiated 10/20- advanced to clear liquid diet, NGT d/c 10/23- advanced to full liquid diet  10/25- advanced to Heart Health/Carb Modified 10/28- Regular diet 10/30- TPN discontinued   Pt reports that it is hard to eat; pt was not able to describe why it is hard to eat. PLDN asked pt if it was due to SOB or fatigue.  Per EMR, pt intake includes:  10/29: Dinner 10% 10/31: Dinner 25% 11/01: Dinner 0%  PLDN recommend Cortrak if within the [place] to the pt. Messaged MD with recommendations for Cortrak; MD wants to hold off on placement of Cortrak at this time. RN on board to encourage good PO intake.   Medications reviewed and include: Dulcolax, Colace, SSI 0-20 units q6h,  Metronidazole, Oxycodone, Protonix, Miralax Labs reviewed: Magnesium 1.8, 24 hr BG trends: 99-156  Admission Weight: 86.8 kg Current Weight: 90.4 kg   Diet Order:   Diet Order             Diet regular Room service appropriate? Yes; Fluid consistency: Thin  Diet effective now                   EDUCATION NEEDS:   No education needs have been identified at this time  Skin:  Skin Assessment: Skin Integrity Issues: Skin Integrity Issues:: Wound VAC Wound Vac: abdomen  Last BM:  01/25/2021  Height:   Ht Readings from Last 1 Encounters:  01/01/21 6' (1.829 m)    Weight:   Wt Readings from Last 1 Encounters:  01/26/21 90.4 kg    Ideal Body Weight:  80.9 kg  BMI:  Body mass index is 27.03 kg/m.  Estimated Nutritional Needs:   Kcal:  2400-2600  Protein:  125-150 grams  Fluid:  > 2 L    Iliani Vejar BS, PLDN Clinical Dietitian See [facility] for contact information.

## 2021-01-26 NOTE — Progress Notes (Signed)
PROGRESS NOTE        PATIENT DETAILS Name: Travis [name] Age: 65 y.o. Sex: male Date of Birth: [birth year] Admit Date: 01/01/2021 Admitting Physician Georganna Skeans, MD LSL:HTDSK, Alyson Locket, NP  Brief Narrative:  Patient is a 65 y.o. male with history of HFrEF, CKD stage III, COPD, HTN, DM-2, HLD, nonobstructive CAD-who presented with abdominal pain-found to have sepsis from ruptured appendicitis-underwent exploratory laparotomy/ileocecectomy and drainage of intra-abdominal abscess.Patient was briefly managed in the ICU-unfortunately-on 10/14 he developed severe abdominal pain -he was then found to have hemorrhagic shock due to a spontaneous splenic rupture-he was reevaluated by general surgery and underwent a splenectomy.  He was readmitted to the ICU-remained on the ventilator-he self extubated on 10/16-he was monitored closely-upon further stability-transfer to the [facility] hospitalist service on 10/20.  Significant events: 10/09>>admit- ex lap, remained intubated post op-to ICU 10/10>>extubated 10/12>> transferred to [facility] 10/14>>hypotensive/abd pain-CT showed spleenc rupture-to OR for splenectomy. 10/16>> Self-extubated  10/18>> Increased O2 requirement to 8L 10/19>> O2 weaned to 4L 10/20>> transferred to [facility]    Radiology/Echo 10/09>> CT abdomen: Acute appendicitis with perforation 10/14>> CT abdomen: Splenic rupture with large splenic hematoma 10/15>> TTE: EF 45-50% 10/20>> CT abdomen/pelvis: Indeterminate 2.4 cm fluid collection in the right lower quadrant adjacent to the cecal resection site.  Moderate left pleural effusion with compressive atelectasis. 10/24 >> CT - 1. Splenectomy with organizing complex fluid collections in the left upper quadrant. The largest collection there is 162 mL. Smaller adjacent collections, some inseparable from the gastric serosa and with associated mass effect and architectural distortion on the stomach. Again favor  hematoma/seroma. Abscess is not excluded, but there is no gas within the collections. 2. Stable small discontiguous rim enhancing fluid collections in the lower small bowel mesentery at the pelvic inlet a short distance from the percutaneous drain. These have not significantly changed and collectively are less than 10 mL  10/27 >> CT - 1. Interval placement of percutaneous pigtail drain catheter in the left upper quadrant within an air and fluid collection, the dominant component in which the catheter is placed measuring 7.9 x 6.6 cm, previously 8.5 x 6.2 cm. 2. Previously seen small, rim enhancing fluid collection in the right lower quadrant is not significantly changed, measuring 1.5 cm. 3. Redemonstrated postoperative findings status post appendectomy and splenectomy. There has been interval removal of a surgical drain in the right lower quadrant. 4. Cholelithiasis  10/31 CT >> Changes of splenectomy and left upper quadrant percutaneous drainage again identified. Slight interval decrease in size of dominant collection within the left upper quadrant. High attenuation material and punctate foci of gas are nonspecific and may relate to recent catheter evaluation and/or flushing. Postsurgical changes of appendectomy with residual postoperative edema or inflammatory change within the greater omentum. No discrete drainable fluid collection identified. Small left pleural effusion, likely reactive. Associated bibasilar compressive atelectasis, left greater than right. Aortic Atherosclerosis.      Subjective:   Patient with diarrhea overnight, but appears to be resolving this morning, overall appetite remains poor.   Objective: Vitals: Blood pressure 125/70, pulse 97, temperature 98.7 F (37.1 C), temperature source Oral, resp. rate 19, height 6' (1.829 m), weight 90.4 kg, SpO2 98 %.   Exam:  Awake Alert, frail, chronically ill-appearing, laying in bed in no apparent distress Symmetrical Chest wall  movement, Good air movement bilaterally, CTAB  RRR,No Gallops,Rubs or new Murmurs, No Parasternal Heave +ve B.Sounds, Abd Soft, midline surgical wound with wound VAC, right upper quadrant drain in place. No Cyanosis, Clubbing or edema, No new Rash or bruise       Assessment/Plan:   Sepsis due to acute appendicitis with perforation/intra-abdominal abscesses and Bacteroides bacteremia (s/p laparotomy/ileocecectomy and drainage of intra-abdominal abscess on 10/9) is finished Zosyn course for bacteroides, now growing E. coli from 01/17/2021 in the abscess: Sepsis physiology has resolved -   postop he did well and antibiotics were stopped however on 01/16/2021 he started spiking fevers again and IR was consulted, CT scan was repeated, per general surgery he had another left upper quadrant drain placed by IR to drain questionable fluid collection.  Fluid growing E. coli.  1 out of 2 blood cultures contaminant with staph epidermis.  He is currently on IV Rocephin and Flagyl combination clinically improved, TNA was be stopped on 01/22/2021 and he is currently tolerating oral diet.  General surgery following.   -Patient was encouraged to increase his oral intake. -He can be discharged on 3 weeks of oral antibiotics once ready for discharge. -Will need close follow-up by IR as an outpatient once ready for discharge, will notify before DC to ensure appropriate follow-ups plans are in place.  Plan will be to discharge him once bed is available on oral/IV antibiotics for another 3 weeks with outpatient general surgery IR follow-up, has PICC, if any further decline per family and patient transition to full comfort care, plan discussed with family multiple times.   Addendum -per previous MD " Had a peer to peer conversation with Dr Sondra Come from the [place] on 01/25/21, I explained to Dr. Nicoletta Dress my concerns that patient is extremely frail, has had multiple abdominal surgeries complicated by residual abscess  and fluid collection, still has abdominal wound VAC and a left upper quadrant drain and requires another 3 to 4 weeks of antibiotics most likely IV due to lack of oral options.  I also expressed my concerns that patient might not do well at an SNF due to intensity of care and to kindly approve him for an LTAC facility for higher level of care and supervision with an experienced wound care team, onsite surgeon or a visiting surgeon and more frequent physician visits.  I also relayed my feelings that if patient goes to SNF and declines as per previous plan he will be transition to palliative care and he will pass away.  I think the only slim chance of getting better is that he goes to an LTAC for higher level of care as compared to SNF.  Unfortunately I was unable to persuade Dr. Nicoletta Dress to approving an LTAC."    Hemorrhagic shock due to spontaneous splenic rupture s/p splenectomy on 10/14: Continue to follow hemoglobin- he was appropriately vaccinated on 01/20/2021.  Acute hypoxic respiratory failure due to atelectasis/pleural effusion: Self extubated on 10/16-stable on anywhere from 2-4 L of oxygen-did develop some transient shortness of breath on 10/20-which responded to bronchodilators/diuretics.    Patient appears to have a moderate-sized left-sided pleural effusion which is likely transudative from-HFrEF/reactive to splenectomy.  PRN Lasix, also IR did ultrasound-guided thoracentesis on 01/17/2021 which was consistent with sympathetic exudative effusion gram stain negative cultures negative  Acute metabolic encephalopathy: Rapidly improving-continue supportive care.  AKI on CKD stage IIIa: AKI hemodynamically mediated-hold Lasix, post hydration and packed RBC transfusion, improved.  HTN: BP stable-continue Coreg-losartan remains on hold  Nonischemic cardiomyopathy/HFrEF: Not in exacerbation-continue  Lasix and monitor volume status closely.  Follow electrolytes.   Nonobstructive CAD: No anginal  symptoms-already on beta-blocker-we will resume aspirin/statin over the next few days when more stable.  Hypomagnesemia - replaced  Anemia of chronic disease and acute disease.  1 unit of packed RBC transfusion on 01/18/2021, no signs of active bleeding, stable posttransfusion H&H.  HLD: Resume statin/fenofibrate when oral intake was stable.  COPD: Stable-continue bronchodilators  Functional quadriplegia/debility/deconditioning: Due to acute illness-continue PT/OT eval-either SNF or LTAC on discharge.  DM-2 (A1c 6.4 on 06/02/2020): CBGs stable-continue SSI  CBG (last 3)  Recent Labs    01/26/21 0005 01/26/21 0639 01/26/21 1159  GLUCAP 131* 99 120*     Nutrition Status: Nutrition Problem: Moderate Malnutrition Etiology: acute illness, chronic illness (perforated appendix, CAD, cardiomyopathy) Signs/Symptoms: mild muscle depletion, mild fat depletion Interventions: Ensure Enlive (each supplement provides 350kcal and 20 grams of protein), MVI  Obesity: Estimated body mass index is 27.03 kg/m as calculated from the following:   Height as of this encounter: 6' (1.829 m).   Weight as of this encounter: 90.4 kg.    Procedures: 10/9>>exploratory laparotomy, ileocecectomy, drainage of intraabdominal abscess  10/14>> exploratory laparotomy and splenectomy 10/14>> left subclavian triple-lumen catheter 01/17/2021  >> left ultrasound-guided thoracentesis 600 cc exudative fluid, gram stain -ve 01/17/2021  >> LUQ JP drain by IR   Consults: General surgery, PCCM DVT Prophylaxis: Heparin Code Status:DNR Family Communication: Sister-in-law at bedside on 01/15/21.  Niece [name] [phone] over the phone on 01/17/2021, 01/20/21, 01/25/21   Diet: Diet Order             Diet regular Room service appropriate? Yes; Fluid consistency: Thin  Diet effective now                    Disposition Plan: Status is: Inpatient  Remains inpatient appropriate because: Requires inpatient  level of care.   Barriers to Discharge: Perforated appendix with pelvic abscess/splenic rupture-s/p laparotomy/splenectomy-not yet stable for discharge.  MEDICATIONS: Scheduled Meds:  (feeding supplement) PROSource Plus  30 mL Oral TID BM   acetaminophen  1,000 mg Oral Q6H   bisacodyl  10 mg Rectal Once   Chlorhexidine Gluconate Cloth  6 each Topical Daily   docusate sodium  100 mg Oral BID   feeding supplement  237 mL Oral TID BM   heparin injection (subcutaneous)  5,000 Units Subcutaneous Q8H    HYDROmorphone (DILAUDID) injection  0.5 mg Intravenous Once   insulin aspart  0-20 Units Subcutaneous Q6H   mouth rinse  15 mL Mouth Rinse BID   methocarbamol  500 mg Oral QID   metoprolol tartrate  25 mg Oral BID   metroNIDAZOLE  500 mg Oral Q12H   mirtazapine  7.5 mg Oral QHS   multivitamin with minerals  1 tablet Oral Daily   oxyCODONE  20 mg Oral Q12H   pantoprazole  40 mg Oral Daily   polyethylene glycol  17 g Oral Daily   Continuous Infusions:  cefTRIAXone (ROCEPHIN)  IV Stopped (01/25/21 1900)   PRN Meds:.bisacodyl, dextromethorphan, HYDROmorphone (DILAUDID) injection, hydroxypropyl methylcellulose / hypromellose, ipratropium-albuterol, ondansetron (ZOFRAN) IV, oxyCODONE, phenol, simethicone, sodium chloride, sodium chloride flush   I have personally reviewed following labs and imaging studies  LABORATORY DATA:  Recent Labs  Lab 01/22/21 0325 01/23/21 0337 01/24/21 0050 01/25/21 0520 01/26/21 0346  WBC 10.9* 11.9* 11.8* 11.3* 10.5  HGB 8.0* 8.2* 8.5* 8.4* 8.6*  HCT 25.5* 26.7* 26.9* 26.8* 28.0*  PLT 522* 542* 552*  552* 582*  MCV 94.8 93.7 92.8 92.1 92.7  MCH 29.7 28.8 29.3 28.9 28.5  MCHC 31.4 30.7 31.6 31.3 30.7  RDW 17.1* 16.8* 16.6* 16.3* 16.5*    Recent Labs  Lab 01/20/21 0453 01/21/21 0131 01/22/21 0325 01/23/21 0337 01/24/21 0050 01/25/21 0520 01/26/21 0346  NA 139 137 139 137 138 135 137  K 3.9 4.1 3.8 4.2 4.3 3.7 3.8  CL 104 104 106 105 103 102  104  CO2 27 26 26 26 25 25 26   GLUCOSE 124* 175* 180* 158* 126* 120* 93  BUN 28* 25* 24* 24* 24* 22 22  CREATININE 1.20 1.11 0.99 0.99 1.11 1.18 1.14  CALCIUM 9.2 9.3 9.4 9.5 9.4 9.5 9.6  AST 41 28 22 23 23 22 25   ALT 42 34 26 27 26 23 23   ALKPHOS 241* 221* 181* 185* 182* 146* 151*  BILITOT 1.0 0.8 0.8 0.7 0.7 0.5 0.7  ALBUMIN 1.6* 1.6* 1.5* 1.6* 1.7* 1.8* 1.9*  MG  --   --   --  1.5* 1.8 1.5* 1.8  PROCALCITON 0.34 0.26 0.21 0.23 0.25 0.22 0.23  BNP 185.7* 124.9* 77.8 73.6  --   --   --      RADIOLOGY STUDIES/RESULTS: No results found.   LOS: 25 days   Signature -  Phillips Climes M.D on 01/26/2021 at 2:25 PM   -  To page go to [email or web address]

## 2021-01-26 NOTE — Progress Notes (Signed)
Patient displayed a 9 beat run of ventricular tachycardia. Attending (Dr. Waldron Labs) notified of cardiac event via secure chat. Pt is currently asymptomatic. No new orders at this time.

## 2021-01-26 NOTE — Progress Notes (Signed)
Patient ID: Travis [name], male   DOB: 1956/02/05, 65 y.o.   MRN: [id number] [facility] Surgery Progress Note  20 Days Post-Op  Subjective: CC-  Not having a lot of abdominal pain today, but states that his stomach is bubbling. Denies n/v. Feels like he needs to have a BM. He had 2 bowel movements yesterday. Drinking fluids but not eating a lot of solid food.  Objective: Vital signs in last 24 hours: Temp:  [98.2 F (36.8 C)-98.7 F (37.1 C)] 98.7 F (37.1 C) (11/03 0802) Pulse Rate:  [90-108] 99 (11/03 0802) Resp:  [17-24] 20 (11/03 0802) BP: (112-134)/(70-80) 119/74 (11/03 0802) SpO2:  [90 %-92 %] 92 % (11/03 0802) Weight:  [90.4 kg] 90.4 kg (11/03 0500) Last BM Date: 01/25/21  Intake/Output from previous day: 11/02 0701 - 11/03 0700 In: 570 [P.O.:520; IV Piggyback:50] Out: 2952 [Urine:1270; Drains:87] Intake/Output this shift: No intake/output data recorded.  PE: Gen:  Alert, NAD, pleasant Pulm:  rate and effort normal Abd: Soft, mild distension, mild right sided TTP without rebound or guarding, +BS, vac to midline incision with good seal, LUQ drain with dark bloody fluid Psych: confused  Lab Results:  Recent Labs    01/25/21 0520 01/26/21 0346  WBC 11.3* 10.5  HGB 8.4* 8.6*  HCT 26.8* 28.0*  PLT 552* 582*   BMET Recent Labs    01/25/21 0520 01/26/21 0346  NA 135 137  K 3.7 3.8  CL 102 104  CO2 25 26  GLUCOSE 120* 93  BUN 22 22  CREATININE 1.18 1.14  CALCIUM 9.5 9.6   PT/INR No results for input(s): LABPROT, INR in the last 72 hours. CMP     Component Value Date/Time   NA 137 01/26/2021 0346   NA 138 06/20/2020 1034   NA 141 04/20/2014 1019   K 3.8 01/26/2021 0346   K 3.7 04/20/2014 1019   CL 104 01/26/2021 0346   CL 106 04/20/2014 1019   CO2 26 01/26/2021 0346   CO2 30 04/20/2014 1019   GLUCOSE 93 01/26/2021 0346   GLUCOSE 102 (H) 04/20/2014 1019   BUN 22 01/26/2021 0346   BUN 18 06/20/2020 1034   BUN 14 04/20/2014 1019    CREATININE 1.14 01/26/2021 0346   CREATININE 1.18 04/20/2014 1019   CALCIUM 9.6 01/26/2021 0346   CALCIUM 9.3 04/20/2014 1019   PROT 6.8 01/26/2021 0346   PROT 6.5 06/18/2017 1425   PROT 7.4 04/20/2014 1019   ALBUMIN 1.9 (L) 01/26/2021 0346   ALBUMIN 4.0 06/18/2017 1425   ALBUMIN 3.4 04/20/2014 1019   AST 25 01/26/2021 0346   AST 21 04/20/2014 1019   ALT 23 01/26/2021 0346   ALT 20 04/20/2014 1019   ALKPHOS 151 (H) 01/26/2021 0346   ALKPHOS 97 04/20/2014 1019   BILITOT 0.7 01/26/2021 0346   BILITOT 0.4 06/18/2017 1425   BILITOT 0.6 04/20/2014 1019   GFRNONAA >60 01/26/2021 0346   GFRNONAA >60 04/20/2014 1019   GFRNONAA >60 11/19/2012 1107   GFRAA 53 (L) 01/13/2020 1410   GFRAA >60 04/20/2014 1019   GFRAA >60 11/19/2012 1107   Lipase     Component Value Date/Time   LIPASE 31 01/01/2021 1023   LIPASE 112 04/20/2014 1019       Studies/Results: No results found.  Anti-infectives: Anti-infectives (From admission, onward)    Start     Dose/Rate Route Frequency Ordered Stop   01/19/21 1200  cefTRIAXone (ROCEPHIN) 2 g in sodium chloride 0.9 % 100 mL  IVPB        2 g 200 mL/hr over 30 Minutes Intravenous Every 24 hours 01/19/21 1056     01/19/21 1145  metroNIDAZOLE (FLAGYL) tablet 500 mg        500 mg Oral Every 12 hours 01/19/21 1056     01/18/21 0615  vancomycin (VANCOREADY) IVPB 1250 mg/250 mL  Status:  Discontinued        1,250 mg 166.7 mL/hr over 90 Minutes Intravenous Every 24 hours 01/18/21 0517 01/19/21 1400   01/17/21 1330  Ampicillin-Sulbactam (UNASYN) 3 g in sodium chloride 0.9 % 100 mL IVPB  Status:  Discontinued        3 g 200 mL/hr over 30 Minutes Intravenous Every 8 hours 01/17/21 1239 01/19/21 1056   01/09/21 1100  piperacillin-tazobactam (ZOSYN) IVPB 3.375 g  Status:  Discontinued        3.375 g 12.5 mL/hr over 240 Minutes Intravenous Every 8 hours 01/09/21 1000 01/12/21 0835   01/09/21 0830  ceFEPIme (MAXIPIME) 2 g in sodium chloride 0.9 % 100 mL IVPB   Status:  Discontinued        2 g 200 mL/hr over 30 Minutes Intravenous Every 12 hours 01/09/21 0737 01/09/21 1000   01/08/21 1400  metroNIDAZOLE (FLAGYL) IVPB 500 mg  Status:  Discontinued        500 mg 100 mL/hr over 60 Minutes Intravenous Every 12 hours 01/08/21 1059 01/09/21 1000   01/08/21 1400  ceFEPIme (MAXIPIME) 2 g in sodium chloride 0.9 % 100 mL IVPB  Status:  Discontinued        2 g 200 mL/hr over 30 Minutes Intravenous Every 24 hours 01/08/21 1114 01/09/21 0737   01/08/21 1000  vancomycin (VANCOREADY) IVPB 750 mg/150 mL  Status:  Discontinued        750 mg 150 mL/hr over 60 Minutes Intravenous Every 24 hours 01/07/21 2052 01/09/21 1000   01/08/21 0600  vancomycin (VANCOREADY) IVPB 1500 mg/300 mL  Status:  Discontinued        1,500 mg 150 mL/hr over 120 Minutes Intravenous Every 24 hours 01/07/21 0430 01/07/21 1210   01/08/21 0600  vancomycin (VANCOREADY) IVPB 750 mg/150 mL  Status:  Discontinued        750 mg 150 mL/hr over 60 Minutes Intravenous Every 24 hours 01/07/21 1210 01/07/21 2052   01/07/21 1400  piperacillin-tazobactam (ZOSYN) IVPB 3.375 g  Status:  Discontinued        3.375 g 12.5 mL/hr over 240 Minutes Intravenous Every 8 hours 01/07/21 0430 01/08/21 1114   01/07/21 0530  piperacillin-tazobactam (ZOSYN) IVPB 3.375 g        3.375 g 100 mL/hr over 30 Minutes Intravenous STAT 01/07/21 0430 01/07/21 0622   01/07/21 0530  vancomycin (VANCOREADY) IVPB 1750 mg/350 mL        1,750 mg 175 mL/hr over 120 Minutes Intravenous  Once 01/07/21 0430 01/07/21 0841   01/05/21 1430  Ampicillin-Sulbactam (UNASYN) 3 g in sodium chloride 0.9 % 100 mL IVPB        3 g 200 mL/hr over 30 Minutes Intravenous Every 6 hours 01/05/21 1334 01/06/21 1552   01/01/21 2200  piperacillin-tazobactam (ZOSYN) IVPB 3.375 g        3.375 g 12.5 mL/hr over 240 Minutes Intravenous Every 8 hours 01/01/21 1841 01/04/21 2359   01/01/21 2100  piperacillin-tazobactam (ZOSYN) IVPB 3.375 g  Status:   Discontinued        3.375 g 100 mL/hr over 30 Minutes Intravenous Every  8 hours 01/01/21 1836 01/01/21 1841   01/01/21 1345  piperacillin-tazobactam (ZOSYN) IVPB 3.375 g        3.375 g 100 mL/hr over 30 Minutes Intravenous  Once 01/01/21 1337 01/01/21 1430        Assessment/Plan POD 25, s/p exploratory laparotomy, ileocecectomy, drainage of intraabdominal abscess 01/01/21 Dr. Grandville Silos for perforated appendicitis with abscess - Drain removed 10/24 - VAC change M/W/F to midline - continue vac at discharge, DME order placed - Continue PT/OT, SNF vs LTACH. Ok for discharge from surgical standpoint once dispo arranged - on a diet and having bowel function but not eating much. Continue bowel regimen. Encourage PO intake   POD 20, s/p ex lap, splenectomy, application of incisional wound vac 10/14. Dr. Bobbye Morton for splenic laceration - CT 10/24 with complex LUQ fluid collection - s/p IR drain 10/25 yielding dark bloody fluid, culture with E coli resistant to unasyn - switched to Rocephin/ flagyl 10/27.  - post-splenectomy vaccines given 10/28 - CT scan 10/31 showed improving LUQ fluid collection with drain in place, no new acute findings  - WBC normalized 10.5, afebrile - Continue drain and antibiotics as above for now, planning for 3 weeks oral antibiotics at discharge. Per IR repeat imaging/possible drain injection once output < 10 mL/QD (excluding flush material.) Awaiting bed at SNF vs [facility] Palliative team is following. "If further decline per family and patient transition to full comfort care."    FEN: TNA stopped 10/30, reg diet VTE: SQH  ID: Zosyn 10/9>> 10/20, unasyn 10/25>>10/27, rocephin/flagyl 10/27>>, vancomycin 10/26>>10/27   Below per primary team: hemorrhagic shock 2/2 above s/p 6 u PRBC, 2 u FFP 10/14 Septic shock 2/2 perforated appendicitis  EtOH abuse Alcohol related cardiomyopathy T2DM Chronic anemia Hx of TIA HTN HLD Gout Hx of GIB CAD Tobacco abuse L pleural  effusion s/p thoracentesis 10/25   LOS: 25 days    Wellington Hampshire, [facility] Surgery 01/26/2021, 8:35 AM Please see Amion for pager number during day hours 7:00am-4:30pm

## 2021-01-27 DIAGNOSIS — E440 Moderate protein-calorie malnutrition: Secondary | ICD-10-CM | POA: Diagnosis not present

## 2021-01-27 DIAGNOSIS — K3532 Acute appendicitis with perforation and localized peritonitis, without abscess: Secondary | ICD-10-CM | POA: Diagnosis not present

## 2021-01-27 DIAGNOSIS — Z515 Encounter for palliative care: Secondary | ICD-10-CM | POA: Diagnosis not present

## 2021-01-27 LAB — COMPREHENSIVE METABOLIC PANEL
ALT: 24 U/L (ref 0–44)
AST: 25 U/L (ref 15–41)
Albumin: 1.9 g/dL — ABNORMAL LOW (ref 3.5–5.0)
Alkaline Phosphatase: 141 U/L — ABNORMAL HIGH (ref 38–126)
Anion gap: 9 (ref 5–15)
BUN: 23 mg/dL (ref 8–23)
CO2: 25 mmol/L (ref 22–32)
Calcium: 9.8 mg/dL (ref 8.9–10.3)
Chloride: 104 mmol/L (ref 98–111)
Creatinine, Ser: 1.1 mg/dL (ref 0.61–1.24)
GFR, Estimated: >60 mL/min (ref >60)
Glucose, Bld: 115 mg/dL — ABNORMAL HIGH (ref 70–99)
Potassium: 3.8 mmol/L (ref 3.5–5.1)
Sodium: 138 mmol/L (ref 135–145)
Total Bilirubin: 1.1 mg/dL (ref 0.3–1.2)
Total Protein: 6.8 g/dL (ref 6.5–8.1)

## 2021-01-27 LAB — GLUCOSE, CAPILLARY
Glucose-Capillary: 115 mg/dL — ABNORMAL HIGH (ref 70–99)
Glucose-Capillary: 115 mg/dL — ABNORMAL HIGH (ref 70–99)
Glucose-Capillary: 187 mg/dL — ABNORMAL HIGH (ref 70–99)
Glucose-Capillary: 98 mg/dL (ref 70–99)

## 2021-01-27 LAB — CBC
HCT: 27.9 % — ABNORMAL LOW (ref 39.0–52.0)
Hemoglobin: 8.7 g/dL — ABNORMAL LOW (ref 13.0–17.0)
MCH: 28.8 pg (ref 26.0–34.0)
MCHC: 31.2 g/dL (ref 30.0–36.0)
MCV: 92.4 fL (ref 80.0–100.0)
Platelets: 608 10*3/uL — ABNORMAL HIGH (ref 150–400)
RBC: 3.02 MIL/uL — ABNORMAL LOW (ref 4.22–5.81)
RDW: 16.5 % — ABNORMAL HIGH (ref 11.5–15.5)
WBC: 11.2 10*3/uL — ABNORMAL HIGH (ref 4.0–10.5)
nRBC: 0.4 % — ABNORMAL HIGH (ref 0.0–0.2)

## 2021-01-27 LAB — MAGNESIUM: Magnesium: 1.5 mg/dL — ABNORMAL LOW (ref 1.7–2.4)

## 2021-01-27 LAB — PHOSPHORUS: Phosphorus: 4.1 mg/dL (ref 2.5–4.6)

## 2021-01-27 LAB — PROCALCITONIN: Procalcitonin: 0.22 ng/mL

## 2021-01-27 MED ORDER — MAGNESIUM SULFATE 2 GM/50ML IV SOLN
2.0000 g | Freq: Once | INTRAVENOUS | Status: AC
  Administered 2021-01-27: 2 g via INTRAVENOUS
  Filled 2021-01-27: qty 50

## 2021-01-27 MED ORDER — POTASSIUM CHLORIDE CRYS ER 20 MEQ PO TBCR
40.0000 meq | EXTENDED_RELEASE_TABLET | Freq: Once | ORAL | Status: AC
  Administered 2021-01-27: 40 meq via ORAL
  Filled 2021-01-27: qty 2

## 2021-01-27 NOTE — TOC Progression Note (Signed)
Transition of Care [facility]) - Progression Note    Patient Details  Name: Travis [name] MRN: [id number] Date of Birth: 25-Jul-1955  Transition of Care [facility]) CM/SW [place], LCSW Phone Number: 01/27/2021, 12:01 PM  Clinical Narrative:    CSW faxed [facility]ACH appeal. Otherwise, will plan on starting auth for [facility] Monday.    Expected Discharge Plan: Long Term Acute Care (LTAC) Barriers to Discharge: Continued Medical Work up, Orthoptist and Services Expected Discharge Plan: Long Term Acute Care (LTAC) In-house Referral: Clinical Social Work   Post Acute Care Choice: Long Term Acute Care (LTAC) Living arrangements for the past 2 months: Single Family Home                                       Social Determinants of Health (SDOH) Interventions    Readmission Risk Interventions No flowsheet data found.

## 2021-01-27 NOTE — Final Consult Note (Signed)
[place] Nurse wound follow up Patient receiving care in [facility] 5W30 Margie Billet, PA-C at bedside for dressing change.  Wound type: Midline abdominal surgical incision Pressure Injury POA: NA  Wound bed: healthy pink granulation tissue. Healing at the point of removing the NPWT Drainage (amount, consistency, odor) sanguinous in canister Periwound: Intact Dressing procedure/placement/frequency: 2 pieces of black foam removed. NPWT discontinued today. Placed a moistened saline gauze in the wound and secured with ABD pad. This will be a daily dressing change. Patient tolerated the procedure well today. WOC will sign off at this time but are available to this patient and the medical team. Please re-consult if needed.    Cathlean Marseilles [name], MSN, RN, [place], Lysle Pearl, [facility] Wound Treatment Associate Pager [phone]

## 2021-01-27 NOTE — Progress Notes (Signed)
Patient ID: Travis [name], male   DOB: 01-16-1956, 65 y.o.   MRN: [id number] [facility] Surgery Progress Note  21 Days Post-Op  Subjective: CC-  No new complaints. States that he is sleepy. Abdomen sore, some intermittent nausea. No emesis. BM yesterday. Not really eating. Drinking 3 Ensure and prosource daily with encouragement.  Objective: Vital signs in last 24 hours: Temp:  [98.1 F (36.7 C)-99.5 F (37.5 C)] 98.1 F (36.7 C) (11/04 0757) Pulse Rate:  [94-106] 94 (11/04 0757) Resp:  [14-20] 16 (11/04 0757) BP: (114-135)/(70-85) 114/85 (11/04 0757) SpO2:  [98 %-100 %] 99 % (11/04 0757) Weight:  [88 kg] 88 kg (11/04 0500) Last BM Date: 01/26/21  Intake/Output from previous day: 11/03 0701 - 11/04 0700 In: 5 [I.V.:5] Out: 265 [Urine:200; Drains:65] Intake/Output this shift: No intake/output data recorded.  PE: Gen:  Alert, NAD, pleasant Pulm:  rate and effort normal Abd: Soft, mild distension, nontender, midline wound pictured below - good granulation tissue, LUQ drain with dark bloody fluid    Lab Results:  Recent Labs    01/26/21 0346 01/27/21 0354  WBC 10.5 11.2*  HGB 8.6* 8.7*  HCT 28.0* 27.9*  PLT 582* 608*   BMET Recent Labs    01/26/21 0346 01/27/21 0354  NA 137 138  K 3.8 3.8  CL 104 104  CO2 26 25  GLUCOSE 93 115*  BUN 22 23  CREATININE 1.14 1.10  CALCIUM 9.6 9.8   PT/INR No results for input(s): LABPROT, INR in the last 72 hours. CMP     Component Value Date/Time   NA 138 01/27/2021 0354   NA 138 06/20/2020 1034   NA 141 04/20/2014 1019   K 3.8 01/27/2021 0354   K 3.7 04/20/2014 1019   CL 104 01/27/2021 0354   CL 106 04/20/2014 1019   CO2 25 01/27/2021 0354   CO2 30 04/20/2014 1019   GLUCOSE 115 (H) 01/27/2021 0354   GLUCOSE 102 (H) 04/20/2014 1019   BUN 23 01/27/2021 0354   BUN 18 06/20/2020 1034   BUN 14 04/20/2014 1019   CREATININE 1.10 01/27/2021 0354   CREATININE 1.18 04/20/2014 1019   CALCIUM 9.8 01/27/2021 0354    CALCIUM 9.3 04/20/2014 1019   PROT 6.8 01/27/2021 0354   PROT 6.5 06/18/2017 1425   PROT 7.4 04/20/2014 1019   ALBUMIN 1.9 (L) 01/27/2021 0354   ALBUMIN 4.0 06/18/2017 1425   ALBUMIN 3.4 04/20/2014 1019   AST 25 01/27/2021 0354   AST 21 04/20/2014 1019   ALT 24 01/27/2021 0354   ALT 20 04/20/2014 1019   ALKPHOS 141 (H) 01/27/2021 0354   ALKPHOS 97 04/20/2014 1019   BILITOT 1.1 01/27/2021 0354   BILITOT 0.4 06/18/2017 1425   BILITOT 0.6 04/20/2014 1019   GFRNONAA >60 01/27/2021 0354   GFRNONAA >60 04/20/2014 1019   GFRNONAA >60 11/19/2012 1107   GFRAA 53 (L) 01/13/2020 1410   GFRAA >60 04/20/2014 1019   GFRAA >60 11/19/2012 1107   Lipase     Component Value Date/Time   LIPASE 31 01/01/2021 1023   LIPASE 112 04/20/2014 1019       Studies/Results: No results found.  Anti-infectives: Anti-infectives (From admission, onward)    Start     Dose/Rate Route Frequency Ordered Stop   01/19/21 1200  cefTRIAXone (ROCEPHIN) 2 g in sodium chloride 0.9 % 100 mL IVPB        2 g 200 mL/hr over 30 Minutes Intravenous Every 24 hours 01/19/21 1056  01/19/21 1145  metroNIDAZOLE (FLAGYL) tablet 500 mg        500 mg Oral Every 12 hours 01/19/21 1056     01/18/21 0615  vancomycin (VANCOREADY) IVPB 1250 mg/250 mL  Status:  Discontinued        1,250 mg 166.7 mL/hr over 90 Minutes Intravenous Every 24 hours 01/18/21 0517 01/19/21 1400   01/17/21 1330  Ampicillin-Sulbactam (UNASYN) 3 g in sodium chloride 0.9 % 100 mL IVPB  Status:  Discontinued        3 g 200 mL/hr over 30 Minutes Intravenous Every 8 hours 01/17/21 1239 01/19/21 1056   01/09/21 1100  piperacillin-tazobactam (ZOSYN) IVPB 3.375 g  Status:  Discontinued        3.375 g 12.5 mL/hr over 240 Minutes Intravenous Every 8 hours 01/09/21 1000 01/12/21 0835   01/09/21 0830  ceFEPIme (MAXIPIME) 2 g in sodium chloride 0.9 % 100 mL IVPB  Status:  Discontinued        2 g 200 mL/hr over 30 Minutes Intravenous Every 12 hours 01/09/21  0737 01/09/21 1000   01/08/21 1400  metroNIDAZOLE (FLAGYL) IVPB 500 mg  Status:  Discontinued        500 mg 100 mL/hr over 60 Minutes Intravenous Every 12 hours 01/08/21 1059 01/09/21 1000   01/08/21 1400  ceFEPIme (MAXIPIME) 2 g in sodium chloride 0.9 % 100 mL IVPB  Status:  Discontinued        2 g 200 mL/hr over 30 Minutes Intravenous Every 24 hours 01/08/21 1114 01/09/21 0737   01/08/21 1000  vancomycin (VANCOREADY) IVPB 750 mg/150 mL  Status:  Discontinued        750 mg 150 mL/hr over 60 Minutes Intravenous Every 24 hours 01/07/21 2052 01/09/21 1000   01/08/21 0600  vancomycin (VANCOREADY) IVPB 1500 mg/300 mL  Status:  Discontinued        1,500 mg 150 mL/hr over 120 Minutes Intravenous Every 24 hours 01/07/21 0430 01/07/21 1210   01/08/21 0600  vancomycin (VANCOREADY) IVPB 750 mg/150 mL  Status:  Discontinued        750 mg 150 mL/hr over 60 Minutes Intravenous Every 24 hours 01/07/21 1210 01/07/21 2052   01/07/21 1400  piperacillin-tazobactam (ZOSYN) IVPB 3.375 g  Status:  Discontinued        3.375 g 12.5 mL/hr over 240 Minutes Intravenous Every 8 hours 01/07/21 0430 01/08/21 1114   01/07/21 0530  piperacillin-tazobactam (ZOSYN) IVPB 3.375 g        3.375 g 100 mL/hr over 30 Minutes Intravenous STAT 01/07/21 0430 01/07/21 0622   01/07/21 0530  vancomycin (VANCOREADY) IVPB 1750 mg/350 mL        1,750 mg 175 mL/hr over 120 Minutes Intravenous  Once 01/07/21 0430 01/07/21 0841   01/05/21 1430  Ampicillin-Sulbactam (UNASYN) 3 g in sodium chloride 0.9 % 100 mL IVPB        3 g 200 mL/hr over 30 Minutes Intravenous Every 6 hours 01/05/21 1334 01/06/21 1552   01/01/21 2200  piperacillin-tazobactam (ZOSYN) IVPB 3.375 g        3.375 g 12.5 mL/hr over 240 Minutes Intravenous Every 8 hours 01/01/21 1841 01/04/21 2359   01/01/21 2100  piperacillin-tazobactam (ZOSYN) IVPB 3.375 g  Status:  Discontinued        3.375 g 100 mL/hr over 30 Minutes Intravenous Every 8 hours 01/01/21 1836 01/01/21  1841   01/01/21 1345  piperacillin-tazobactam (ZOSYN) IVPB 3.375 g        3.375 g  100 mL/hr over 30 Minutes Intravenous  Once 01/01/21 1337 01/01/21 1430        Assessment/Plan POD 26, s/p exploratory laparotomy, ileocecectomy, drainage of intraabdominal abscess 01/01/21 Dr. Grandville Silos for perforated appendicitis with abscess - Drain removed 10/24 - d/c vac and transition to daily wet to dry dressing changes 11/4 - Continue PT/OT, SNF vs LTACH. Ok for discharge from surgical standpoint once dispo arranged - on a diet and having bowel function but not eating much. Continue bowel regimen. Encourage PO intake   POD 20, s/p ex lap, splenectomy, application of incisional wound vac 10/14. Dr. Bobbye Morton for splenic laceration - CT 10/24 with complex LUQ fluid collection - s/p IR drain 10/25 yielding dark bloody fluid, culture with E coli resistant to unasyn - switched to Rocephin/ flagyl 10/27.  - post-splenectomy vaccines given 10/28 - CT scan 10/31 showed improving LUQ fluid collection with drain in place, no new acute findings  - WBC normalized 10.5, afebrile - Continue drain and antibiotics as above for now, planning for 3 weeks oral antibiotics at discharge. Per IR repeat imaging/possible drain injection once output < 10 mL/QD (excluding flush material.) Awaiting bed at SNF vs [facility] Palliative team is following. "If further decline per family and patient transition to full comfort care."    FEN: TNA stopped 10/30, reg diet VTE: SQH  ID: Zosyn 10/9>> 10/20, unasyn 10/25>>10/27, rocephin/flagyl 10/27>>, vancomycin 10/26>>10/27   Below per primary team: hemorrhagic shock 2/2 above s/p 6 u PRBC, 2 u FFP 10/14 Septic shock 2/2 perforated appendicitis  EtOH abuse Alcohol related cardiomyopathy T2DM Chronic anemia Hx of TIA HTN HLD Gout Hx of GIB CAD Tobacco abuse L pleural effusion s/p thoracentesis 10/25   LOS: 26 days    Wellington Hampshire, [facility] Surgery 01/27/2021,  8:23 AM Please see Amion for pager number during day hours 7:00am-4:30pm

## 2021-01-27 NOTE — Progress Notes (Signed)
Palliative Medicine Inpatient Follow Up Note  Consulting Provider: Mollie Germany, RN   Reason for consult:   Palliative Care Consult Services Palliative Medicine Consult  Reason for Consult? Pt is needing increasing pressor/vent support, 2 major abdominal surgeries. RN and MD have spoken with family multiple times about comfort care but family is still having some trouble processing everything. Thank you!    HPI:  Per intake H&P --> Travis [name]. Travis [name] is a 65 year old gentleman with an extended PMH that includes but not limited to CHF, CKD 3A, COPD, CRA O, TIA, diabetes, hypertension, gout, hyperlipidemia, CAD (nonobstructive). He presented to the ED on 01/01/21 with complaints of right lower quadrant abdominal pain --> taken to the operating room where he was found to have a perforated appendix and abdominal abscess. 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 Now with rapidly worsening shock. Palliative care was asked to get involved to further address goals of care in the setting of overall critical illness and ongoing declining state.   Today's Discussion (01/27/2021):  *Please note that this is a verbal dictation therefore any spelling or grammatical errors are due to the "[employer] One" system interpretation.  Chart reviewed.   I met with Travis [name] at bedside this afternoon. His niece and nephew were both present. He shared with me that he did "an hour of work" with mobility and transfers which have wiped him out. We discussed the importance of continued movement and mobility for improvements. I shared with him that he will need to allow time for his situation to improve. Travis [name] was thankful and appears overall to be in far less pain than prior. We reviewed the plan for him to transition to a facility to continue working on strengthening him when he is accepted at one.  Per Travis [name]'s niece the plan will be for [facility]. Per Travis [name] there is  an appeal with insurance to authorize patient transfer.   Objective Assessment: Vital Signs Vitals:   01/27/21 0757 01/27/21 1236  BP: 114/85 118/88  Pulse: 94 (!) 101  Resp: 16 20  Temp: 98.1 F (36.7 C) (!) 97.5 F (36.4 C)  SpO2: 99% 100%    Intake/Output Summary (Last 24 hours) at 01/27/2021 1425 Last data filed at 01/27/2021 0500 Gross per 24 hour  Intake --  Output 265 ml  Net -265 ml    Last Weight  Most recent update: 01/27/2021  5:04 AM    Weight  88 kg (194 lb 0.1 oz)            Gen: Older AA M in NAD HEENT: Dry mucous membranes CV: Regular rate and rhythm  PULM: On 2LPM [place] ABD: (+) abdominal dressings, (+) wound vac, (+) drain EXT: No edema  Neuro: Alert and oriented, able to converse  SUMMARY OF RECOMMENDATIONS   DNAR/DNI/MOST on chart  Goals are clear for improvements though if declines occur family is realistic about transition towards comfort  Plan for [facility] if [facility] goes through otherwise [employer] per family  Palliative Care will sign off please re-consult if additional care needs are identified  Time Spent: 25 Greater than 50% of the time was spent in counseling and coordination of care _________________________________________________________________________________ Travis [name] [name] Palliative Medicine Team Team Cell Phone: [phone] Please utilize secure chat with additional questions, if there is no response within 30 minutes please call the above phone number  Palliative Medicine Team  providers are available by phone from 7am to 7pm daily and can be reached through the team cell phone.  Should this patient require assistance outside of these hours, please call the patient's attending physician.

## 2021-01-27 NOTE — Progress Notes (Signed)
Occupational Therapy Treatment Patient Details Name: Travis [name] MRN: [id number] DOB: [birth year] Today's Date: 01/27/2021   History of present illness Pt is a 65 y/o male admitted 10/9 with complaints of R lower quadrant abdominal pain. Found with intra-abdominal sepsis with perforated appendix and abdominal abscess; s/p diagnostic laparoscopy, ileocecectomy, drainage of interaabdominal abscess 10/9. Post op ileus. 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 Pt self extubated 10/16. L thoracentesis 10/25 with 600cc drained. PMHx: CHF, CKD, COPD, TIA, diabetes, HTN, gout, CAD.   OT comments  Patient supine in bed and agreeable to limited session.  Patient completing mobility with min assist +2 using RW for safety/balance, LB dressing with min assist.  He remains confused but redirectable today.  Complains of back pain throughout session, but reports heat pack assists with pain at end of session.  Continue per POC.      Recommendations for follow up therapy are one component of a multi-disciplinary discharge planning process, led by the attending physician.  Recommendations may be updated based on patient status, additional functional criteria and insurance authorization.    Follow Up Recommendations  Skilled nursing-short term rehab (<3 hours/day)    Assistance Recommended at Discharge Frequent or constant Supervision/Assistance  Equipment Recommendations  [facility]    Recommendations for Other Services      Precautions / Restrictions Precautions Precautions: Fall Precaution Comments: abdominal wound vac d/c'd 11/4 Restrictions Weight Bearing Restrictions: No       Mobility Bed Mobility Overal bed mobility: Needs Assistance Bed Mobility: Supine to Sit     Supine to sit: HOB elevated;Supervision     General bed mobility comments: +rail, supervision for safety    Transfers Overall transfer level: Needs assistance Equipment used: Rolling walker (2 wheels) Transfers:  Sit to/from Stand Sit to Stand: Min assist           General transfer comment: cues for sequencing, increased time to power up     Balance Overall balance assessment: Needs assistance Sitting-balance support: Feet supported;No upper extremity supported Sitting balance-Leahy Scale: Good     Standing balance support: Bilateral upper extremity supported;During functional activity Standing balance-Leahy Scale: Poor Standing balance comment: reliant on external support                           ADL either performed or assessed with clinical judgement   ADL Overall ADL's : Needs assistance/impaired Eating/Feeding: Set up;Sitting Eating/Feeding Details (indicate cue type and reason): to drink only                 Lower Body Dressing: Minimal assistance;Sit to/from stand Lower Body Dressing Details (indicate cue type and reason): adjusts socks with supervision in sitting, min assist sit to stand Toilet Transfer: Minimal assistance;Ambulation;Rolling walker (2 wheels) Toilet Transfer Details (indicate cue type and reason): simulated to recliner         Functional mobility during ADLs: Minimal assistance;Rolling walker (2 wheels) General ADL Comments: remains limited by cognition, weakness, back pain     Vision       Perception     Praxis      Cognition Arousal/Alertness: Awake/alert Behavior During Therapy: Flat affect Overall Cognitive Status: No family/caregiver present to determine baseline cognitive functioning Area of Impairment: Orientation;Attention;Following commands;Problem solving;Awareness;Memory;Safety/judgement                 Orientation Level: Disoriented to;Time Current Attention Level: Selective Memory: Decreased recall of precautions;Decreased short-term memory Following Commands: Follows one  step commands consistently;Follows one step commands with increased time Safety/Judgement: Decreased awareness of deficits;Decreased  awareness of safety Awareness: Emergent Problem Solving: Decreased initiation;Slow processing;Requires verbal cues;Difficulty sequencing;Requires tactile cues General Comments: remains confused but appears to be clearing. Can become defensive with orientation questions. Participates better with just focusing on general conversation.          Exercises     Shoulder Instructions       General Comments VSS on RA    Pertinent Vitals/ Pain       Pain Assessment: Faces Faces Pain Scale: Hurts even more Pain Location: back Pain Descriptors / Indicators: Grimacing;Guarding;Discomfort Pain Intervention(s): Limited activity within patient's tolerance;Monitored during session;Repositioned;Heat applied  Home Living                                          Prior Functioning/Environment              Frequency  Min 2X/week        Progress Toward Goals  OT Goals(current goals can now be found in the care plan section)  Progress towards OT goals: Progressing toward goals  Acute Rehab OT Goals Time For Goal Achievement: 02/07/21 Potential to Achieve Goals: Good  Plan Discharge plan remains appropriate;Frequency remains appropriate    Co-evaluation    PT/OT/SLP Co-Evaluation/Treatment: Yes Reason for Co-Treatment: For patient/therapist safety;To address functional/ADL transfers PT goals addressed during session: Mobility/safety with mobility;Balance;Proper use of DME OT goals addressed during session: ADL's and self-care      AM-PAC OT "6 Clicks" Daily Activity     Outcome Measure   Help from another person eating meals?: A Little Help from another person taking care of personal grooming?: A Little Help from another person toileting, which includes using toliet, bedpan, or urinal?: A Lot Help from another person bathing (including washing, rinsing, drying)?: A Lot Help from another person to put on and taking off regular upper body clothing?: A  Little Help from another person to put on and taking off regular lower body clothing?: A Little 6 Click Score: 16    End of Session Equipment Utilized During Treatment: Rolling walker (2 wheels);Gait belt  OT Visit Diagnosis: Other abnormalities of gait and mobility (R26.89);Muscle weakness (generalized) (M62.81);Pain;Other symptoms and signs involving cognitive function Pain - part of body:  (back)   Activity Tolerance Patient tolerated treatment well   Patient Left in chair;with call bell/phone within reach;with chair alarm set   Nurse Communication Mobility status        Time: 4166-0630 OT Time Calculation (min): 25 min  Charges: OT General Charges $OT Visit: 1 Visit OT Treatments $Self Care/Home Management : 8-22 mins  Jolaine Artist, OT [place] Pager [phone] Office [phone]   Delight Stare 01/27/2021, 12:55 PM

## 2021-01-27 NOTE — Progress Notes (Signed)
PROGRESS NOTE        PATIENT DETAILS Name: Travis [name] Age: 65 y.o. Sex: male Date of Birth: 1955/12/06 Admit Date: 01/01/2021 Admitting Physician Georganna Skeans, MD LKT:GYBWL, Alyson Locket, NP  Brief Narrative:  Patient is a 65 y.o. male with history of HFrEF, CKD stage III, COPD, HTN, DM-2, HLD, nonobstructive CAD-who presented with abdominal pain-found to have sepsis from ruptured appendicitis-underwent exploratory laparotomy/ileocecectomy and drainage of intra-abdominal abscess.Patient was briefly managed in the ICU-unfortunately-on 10/14 he developed severe abdominal pain -he was then found to have hemorrhagic shock due to a spontaneous splenic rupture-he was reevaluated by general surgery and underwent a splenectomy.  He was readmitted to the ICU-remained on the ventilator-he self extubated on 10/16-he was monitored closely-upon further stability-transfer to the [facility] hospitalist service on 10/20.  Significant events: 10/09>>admit- ex lap, remained intubated post op-to ICU 10/10>>extubated 10/12>> transferred to [facility] 10/14>>hypotensive/abd pain-CT showed spleenc rupture-to OR for splenectomy. 10/16>> Self-extubated  10/18>> Increased O2 requirement to 8L 10/19>> O2 weaned to 4L 10/20>> transferred to [facility]    Radiology/Echo 10/09>> CT abdomen: Acute appendicitis with perforation 10/14>> CT abdomen: Splenic rupture with large splenic hematoma 10/15>> TTE: EF 45-50% 10/20>> CT abdomen/pelvis: Indeterminate 2.4 cm fluid collection in the right lower quadrant adjacent to the cecal resection site.  Moderate left pleural effusion with compressive atelectasis. 10/24 >> CT - 1. Splenectomy with organizing complex fluid collections in the left upper quadrant. The largest collection there is 162 mL. Smaller adjacent collections, some inseparable from the gastric serosa and with associated mass effect and architectural distortion on the stomach. Again favor  hematoma/seroma. Abscess is not excluded, but there is no gas within the collections. 2. Stable small discontiguous rim enhancing fluid collections in the lower small bowel mesentery at the pelvic inlet a short distance from the percutaneous drain. These have not significantly changed and collectively are less than 10 mL  10/27 >> CT - 1. Interval placement of percutaneous pigtail drain catheter in the left upper quadrant within an air and fluid collection, the dominant component in which the catheter is placed measuring 7.9 x 6.6 cm, previously 8.5 x 6.2 cm. 2. Previously seen small, rim enhancing fluid collection in the right lower quadrant is not significantly changed, measuring 1.5 cm. 3. Redemonstrated postoperative findings status post appendectomy and splenectomy. There has been interval removal of a surgical drain in the right lower quadrant. 4. Cholelithiasis  10/31 CT >> Changes of splenectomy and left upper quadrant percutaneous drainage again identified. Slight interval decrease in size of dominant collection within the left upper quadrant. High attenuation material and punctate foci of gas are nonspecific and may relate to recent catheter evaluation and/or flushing. Postsurgical changes of appendectomy with residual postoperative edema or inflammatory change within the greater omentum. No discrete drainable fluid collection identified. Small left pleural effusion, likely reactive. Associated bibasilar compressive atelectasis, left greater than right. Aortic Atherosclerosis.      Subjective:   No further diarrhea, patient remains with poor appetite, but he was able to finish Ensure this morning with breakfast.     Objective: Vitals: Blood pressure 118/88, pulse (!) 101, temperature (!) 97.5 F (36.4 C), temperature source Oral, resp. rate 20, height 6' (1.829 m), weight 88 kg, SpO2 100 %.   Exam:   Awake Alert, frail, pleasant, sitting in bed in no apparent distress Symmetrical  Chest wall movement, Good air movement bilaterally, CTAB RRR,No Gallops,Rubs or new Murmurs, No Parasternal Heave +ve B.Sounds, Abd Soft, No tenderness, midline surgical wound with wound VAC, right upper quadrant drain in place. No Cyanosis, Clubbing or edema, No new Rash or bruise       Assessment/Plan:   Sepsis due to acute appendicitis with perforation/intra-abdominal abscesses and Bacteroides bacteremia (s/p laparotomy/ileocecectomy and drainage of intra-abdominal abscess on 10/9) is finished Zosyn course for bacteroides, now growing E. coli from 01/17/2021 in the abscess:  - Sepsis physiology has resolved -   postop he did well and antibiotics were stopped however on 01/16/2021 he started spiking fevers again and IR was consulted, CT scan was repeated, per general surgery he had another left upper quadrant drain placed by IR to drain questionable fluid collection.  Fluid growing E. coli.    He is currently on IV Rocephin and Flagyl combination clinically improved, TNA was be stopped on 01/22/2021 and he is currently tolerating oral diet.  General surgery following.   -He can be discharged on 3 weeks of oral antibiotics once ready for discharge. -Will need close follow-up by IR as an outpatient once ready for discharge, will notify before DC to ensure appropriate follow-ups plans are in place. -Appetite is poor, he was encouraged to increase his oral intake today, reports he will try, he already finished 1 bottle of Ensure with breakfast.  Plan will be to discharge him once bed is available on oral/IV antibiotics for another 3 weeks with outpatient general surgery IR follow-up, has PICC, if any further decline per family and patient transition to full comfort care, plan discussed with family multiple times.   1 out of 2 blood cultures contaminant with staph epidermis. -This is most likely contaminant.  Hemorrhagic shock due to spontaneous splenic rupture s/p splenectomy on 10/14: Continue  to follow hemoglobin- he was appropriately vaccinated on 01/20/2021.  Acute hypoxic respiratory failure due to atelectasis/pleural effusion: Self extubated on 10/16-stable on anywhere from 2-4 L of oxygen-did develop some transient shortness of breath on 10/20-which responded to bronchodilators/diuretics.    Patient appears to have a moderate-sized left-sided pleural effusion which is likely transudative from-HFrEF/reactive to splenectomy.  PRN Lasix, also IR did ultrasound-guided thoracentesis on 01/17/2021 which was consistent with sympathetic exudative effusion gram stain negative cultures negative  Acute metabolic encephalopathy: Rapidly improving-continue supportive care.  AKI on CKD stage IIIa: AKI hemodynamically mediated-hold Lasix, post hydration and packed RBC transfusion, improved.  HTN: BP stable-continue Coreg-losartan remains on hold  Nonischemic cardiomyopathy/HFrEF: Not in exacerbation-continue Lasix and monitor volume status closely.  Follow electrolytes.   Nonobstructive CAD: No anginal symptoms-already on beta-blocker-we will resume aspirin/statin over the next few days when more stable.  Hypomagnesemia - replaced  Anemia of chronic disease and acute disease.  1 unit of packed RBC transfusion on 01/18/2021, no signs of active bleeding, stable posttransfusion H&H.  HLD: Resume statin/fenofibrate when oral intake was stable.  COPD: Stable-continue bronchodilators  Functional quadriplegia/debility/deconditioning: Due to acute illness-continue PT/OT eval-either SNF or LTAC on discharge.  DM-2 (A1c 6.4 on 06/02/2020): CBGs stable-continue SSI  CBG (last 3)  Recent Labs    01/27/21 0119 01/27/21 0614 01/27/21 1202  GLUCAP 115* 115* 187*     Nutrition Status: Nutrition Problem: Moderate Malnutrition Etiology: acute illness, chronic illness (perforated appendix, CAD, cardiomyopathy) Signs/Symptoms: mild muscle depletion, mild fat depletion Interventions: Ensure  Enlive (each supplement provides 350kcal and 20 grams of protein), MVI  Obesity: Estimated body mass index is 26.[place]  kg/m as calculated from the following:   Height as of this encounter: 6' (1.829 m).   Weight as of this encounter: 88 kg.    Procedures: 10/9>>exploratory laparotomy, ileocecectomy, drainage of intraabdominal abscess  10/14>> exploratory laparotomy and splenectomy 10/14>> left subclavian triple-lumen catheter 01/17/2021  >> left ultrasound-guided thoracentesis 600 cc exudative fluid, gram stain -ve 01/17/2021  >> LUQ JP drain by IR   Consults: General surgery, PCCM DVT Prophylaxis: Heparin Code Status:DNR Family Communication: Sister-in-law at bedside on 01/15/21.  Niece [name] [phone] over the phone on 01/17/2021, 01/20/21, 01/25/21  Addendum -per previous MD " Had a peer to peer conversation with Dr Sondra Come from the [facility] on 01/25/21, I explained to Dr. Nicoletta Dress my concerns that patient is extremely frail, has had multiple abdominal surgeries complicated by residual abscess and fluid collection, still has abdominal wound VAC and a left upper quadrant drain and requires another 3 to 4 weeks of antibiotics most likely IV due to lack of oral options.  I also expressed my concerns that patient might not do well at an SNF due to intensity of care and to kindly approve him for an LTAC facility for higher level of care and supervision with an experienced wound care team, onsite surgeon or a visiting surgeon and more frequent physician visits.  I also relayed my feelings that if patient goes to SNF and declines as per previous plan he will be transition to palliative care and he will pass away.  I think the only slim chance of getting better is that he goes to an LTAC for higher level of care as compared to SNF.  Unfortunately I was unable to persuade Dr. Nicoletta Dress to approving an LTAC."  Diet: Diet Order             Diet regular Room service appropriate? Yes; Fluid  consistency: Thin  Diet effective now                    Disposition Plan: Status is: Inpatient  Remains inpatient appropriate because: Requires inpatient level of care.   Barriers to Discharge: Perforated appendix with pelvic abscess/splenic rupture-s/p laparotomy/splenectomy-not yet stable for discharge.  MEDICATIONS: Scheduled Meds:  (feeding supplement) PROSource Plus  30 mL Oral TID BM   acetaminophen  1,000 mg Oral Q6H   bisacodyl  10 mg Rectal Once   Chlorhexidine Gluconate Cloth  6 each Topical Daily   docusate sodium  100 mg Oral BID   feeding supplement  237 mL Oral TID BM   heparin injection (subcutaneous)  5,000 Units Subcutaneous Q8H    HYDROmorphone (DILAUDID) injection  0.5 mg Intravenous Once   insulin aspart  0-20 Units Subcutaneous Q6H   mouth rinse  15 mL Mouth Rinse BID   methocarbamol  500 mg Oral QID   metoprolol tartrate  25 mg Oral BID   metroNIDAZOLE  500 mg Oral Q12H   mirtazapine  7.5 mg Oral QHS   multivitamin with minerals  1 tablet Oral Daily   oxyCODONE  20 mg Oral Q12H   pantoprazole  40 mg Oral Daily   polyethylene glycol  17 g Oral Daily   Continuous Infusions:  cefTRIAXone (ROCEPHIN)  IV 2 g (01/27/21 1430)   PRN Meds:.bisacodyl, dextromethorphan, HYDROmorphone (DILAUDID) injection, hydroxypropyl methylcellulose / hypromellose, ipratropium-albuterol, ondansetron (ZOFRAN) IV, oxyCODONE, phenol, simethicone, sodium chloride, sodium chloride flush   I have personally reviewed following labs and imaging studies  LABORATORY DATA:  Recent Labs  Lab 01/23/21 [phone]  01/24/21 0050 01/25/21 0520 01/26/21 0346 01/27/21 0354  WBC 11.9* 11.8* 11.3* 10.5 11.2*  HGB 8.2* 8.5* 8.4* 8.6* 8.7*  HCT 26.7* 26.9* 26.8* 28.0* 27.9*  PLT 542* 552* 552* 582* 608*  MCV 93.7 92.8 92.1 92.7 92.4  MCH 28.8 29.3 28.9 28.5 28.8  MCHC 30.7 31.6 31.3 30.7 31.2  RDW 16.8* 16.6* 16.3* 16.5* 16.5*    Recent Labs  Lab 01/21/21 0131 01/22/21 0325  01/23/21 0337 01/24/21 0050 01/25/21 0520 01/26/21 0346 01/27/21 0354  NA 137 139 137 138 135 137 138  K 4.1 3.8 4.2 4.3 3.7 3.8 3.8  CL 104 106 105 103 102 104 104  CO2 26 26 26 25 25 26 25   GLUCOSE 175* 180* 158* 126* 120* 93 115*  BUN 25* 24* 24* 24* 22 22 23   CREATININE 1.11 0.99 0.99 1.11 1.18 1.14 1.10  CALCIUM 9.3 9.4 9.5 9.4 9.5 9.6 9.8  AST 28 22 23 23 22 25 25   ALT 34 26 27 26 23 23 24   ALKPHOS 221* 181* 185* 182* 146* 151* 141*  BILITOT 0.8 0.8 0.7 0.7 0.5 0.7 1.1  ALBUMIN 1.6* 1.5* 1.6* 1.7* 1.8* 1.9* 1.9*  MG  --   --  1.5* 1.8 1.5* 1.8 1.5*  PROCALCITON 0.26 0.21 0.23 0.25 0.22 0.23 0.22  BNP 124.9* 77.8 73.6  --   --   --   --      RADIOLOGY STUDIES/RESULTS: No results found.   LOS: 26 days   Signature -  Phillips Climes M.D on 01/27/2021 at 3:22 PM   -  To page go to [email or web address]

## 2021-01-27 NOTE — Progress Notes (Signed)
Physical Therapy Treatment Patient Details Name: Travis [name] MRN: [id number] DOB: 1955/04/09 Today's Date: 01/27/2021   History of Present Illness Pt is a 65 y/o male admitted 10/9 with complaints of R lower quadrant abdominal pain. Found with intra-abdominal sepsis with perforated appendix and abdominal abscess; s/p diagnostic laparoscopy, ileocecectomy, drainage of interaabdominal abscess 10/9. Post op ileus. 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 Pt self extubated 10/16. L thoracentesis 10/25 with 600cc drained. PMHx: CHF, CKD, COPD, TIA, diabetes, HTN, gout, CAD.    PT Comments    Pt received in bed, agreeable to participation in therapy. Abdominal wound vac d/c'd this AM prior to session. He required supervision bed mobility, min assist transfers, and min assist ambulation 20' with RW +2 safety/line management. Pt agreeable to sit up in recliner at end of session. Pt very cooperative and receptive to therapy intervention today. He has been resistant to participate in mobility and self limiting on prior attempts.   Recommendations for follow up therapy are one component of a multi-disciplinary discharge planning process, led by the attending physician.  Recommendations may be updated based on patient status, additional functional criteria and insurance authorization.  Follow Up Recommendations  Skilled nursing-short term rehab (<3 hours/day)     Assistance Recommended at Discharge Frequent or constant Supervision/Assistance  Equipment Recommendations  Wheelchair cushion (measurements PT);Wheelchair (measurements PT)    Recommendations for Other Services       Precautions / Restrictions Precautions Precautions: Fall Precaution Comments: abdominal wound vac d/c'd 11/4     Mobility  Bed Mobility Overal bed mobility: Needs Assistance Bed Mobility: Supine to Sit     Supine to sit: HOB elevated;Supervision     General bed mobility comments: +rail, supervision  for safety    Transfers Overall transfer level: Needs assistance Equipment used: Rolling walker (2 wheels) Transfers: Sit to/from Stand Sit to Stand: Min assist           General transfer comment: cues for sequencing, increased time to power up    Ambulation/Gait Ambulation/Gait assistance: +2 safety/equipment;Min assist Gait Distance (Feet): 20 Feet Assistive device: Rolling walker (2 wheels) Gait Pattern/deviations: Step-through pattern;Decreased stride length;Trunk flexed Gait velocity: decreased Gait velocity interpretation: <1.8 ft/sec, indicate of risk for recurrent falls General Gait Details: slow, guarded gait. Distance limited by back pain. +2 for safety/line management   Stairs             Wheelchair Mobility    Modified Rankin (Stroke Patients Only)       Balance Overall balance assessment: Needs assistance Sitting-balance support: Feet supported;No upper extremity supported Sitting balance-Leahy Scale: Good     Standing balance support: Bilateral upper extremity supported;During functional activity Standing balance-Leahy Scale: Poor Standing balance comment: reliant on external support                            Cognition Arousal/Alertness: Awake/alert Behavior During Therapy: Flat affect Overall Cognitive Status: No family/caregiver present to determine baseline cognitive functioning Area of Impairment: Orientation;Attention;Following commands;Problem solving;Awareness;Memory;Safety/judgement                 Orientation Level: Disoriented to;Time Current Attention Level: Selective Memory: Decreased recall of precautions;Decreased short-term memory Following Commands: Follows one step commands consistently;Follows one step commands with increased time Safety/Judgement: Decreased awareness of deficits;Decreased awareness of safety Awareness: Emergent Problem Solving: Decreased initiation;Slow processing;Requires verbal  cues;Difficulty sequencing;Requires tactile cues General Comments: remains confused but appears to be clearing. Can become  defensive with orientation questions. Participates better with just focusing on general conversation.        Exercises      General Comments General comments (skin integrity, edema, etc.): VSS on RA      Pertinent Vitals/Pain Pain Assessment: Faces Faces Pain Scale: Hurts even more Pain Location: back Pain Descriptors / Indicators: Grimacing;Guarding;Discomfort Pain Intervention(s): Monitored during session;Limited activity within patient's tolerance;Repositioned    Home Living                          Prior Function            PT Goals (current goals can now be found in the care plan section) Acute Rehab PT Goals Patient Stated Goal: not stated Progress towards PT goals: Progressing toward goals    Frequency    Min 2X/week      PT Plan Current plan remains appropriate    Co-evaluation PT/OT/SLP Co-Evaluation/Treatment: Yes Reason for Co-Treatment: For patient/therapist safety;To address functional/ADL transfers;Necessary to address cognition/behavior during functional activity PT goals addressed during session: Mobility/safety with mobility;Balance;Proper use of DME        AM-PAC PT "6 Clicks" Mobility   Outcome Measure  Help needed turning from your back to your side while in a flat bed without using bedrails?: A Little Help needed moving from lying on your back to sitting on the side of a flat bed without using bedrails?: A Little Help needed moving to and from a bed to a chair (including a wheelchair)?: A Little Help needed standing up from a chair using your arms (e.g., wheelchair or bedside chair)?: A Little Help needed to walk in hospital room?: A Little Help needed climbing 3-5 steps with a railing? : Total 6 Click Score: 16    End of Session Equipment Utilized During Treatment: Gait belt Activity Tolerance: Patient  tolerated treatment well Patient left: in chair;with call bell/phone within reach;with chair alarm set Nurse Communication: Mobility status PT Visit Diagnosis: Difficulty in walking, not elsewhere classified (R26.2);Other abnormalities of gait and mobility (R26.89);Muscle weakness (generalized) (M62.81);Pain     Time: 1583-0940 PT Time Calculation (min) (ACUTE ONLY): 26 min  Charges:  $Gait Training: 8-22 mins                     Lorrin Goodell, [place]  Office # [phone] Pager [phone]    Lorriane Shire 01/27/2021, 12:28 PM

## 2021-01-28 DIAGNOSIS — E440 Moderate protein-calorie malnutrition: Secondary | ICD-10-CM | POA: Diagnosis not present

## 2021-01-28 DIAGNOSIS — K3532 Acute appendicitis with perforation and localized peritonitis, without abscess: Secondary | ICD-10-CM | POA: Diagnosis not present

## 2021-01-28 LAB — GLUCOSE, CAPILLARY
Glucose-Capillary: 101 mg/dL — ABNORMAL HIGH (ref 70–99)
Glucose-Capillary: 111 mg/dL — ABNORMAL HIGH (ref 70–99)
Glucose-Capillary: 152 mg/dL — ABNORMAL HIGH (ref 70–99)

## 2021-01-28 LAB — CBC
HCT: 28.8 % — ABNORMAL LOW (ref 39.0–52.0)
Hemoglobin: 8.8 g/dL — ABNORMAL LOW (ref 13.0–17.0)
MCH: 28.3 pg (ref 26.0–34.0)
MCHC: 30.6 g/dL (ref 30.0–36.0)
MCV: 92.6 fL (ref 80.0–100.0)
Platelets: 592 10*3/uL — ABNORMAL HIGH (ref 150–400)
RBC: 3.11 MIL/uL — ABNORMAL LOW (ref 4.22–5.81)
RDW: 16.4 % — ABNORMAL HIGH (ref 11.5–15.5)
WBC: 11.1 10*3/uL — ABNORMAL HIGH (ref 4.0–10.5)
nRBC: 0.2 % (ref 0.0–0.2)

## 2021-01-28 LAB — PROCALCITONIN: Procalcitonin: 0.21 ng/mL

## 2021-01-28 LAB — MAGNESIUM: Magnesium: 1.6 mg/dL — ABNORMAL LOW (ref 1.7–2.4)

## 2021-01-28 MED ORDER — MIRTAZAPINE 15 MG PO TABS
15.0000 mg | ORAL_TABLET | Freq: Every day | ORAL | Status: DC
  Administered 2021-01-28 – 2021-01-29 (×2): 15 mg via ORAL
  Filled 2021-01-28 (×2): qty 1

## 2021-01-28 MED ORDER — MAGNESIUM SULFATE 2 GM/50ML IV SOLN
2.0000 g | Freq: Once | INTRAVENOUS | Status: AC
  Administered 2021-01-28: 2 g via INTRAVENOUS
  Filled 2021-01-28: qty 50

## 2021-01-28 NOTE — Progress Notes (Signed)
Patient ID: Travis [name], male   DOB: Jul 06, 1955, 65 y.o.   MRN: [id number] [facility] Surgery Progress Note  22 Days Post-Op  Subjective: CC-  Resting comfortably. WBC stable at 11.1, afebrile Drain with 20cc drainage last 24 hours Drain 2 ensure and prosource x3 yesterday. Last BM 2 days ago  Objective: Vital signs in last 24 hours: Temp:  [97.5 F (36.4 C)-99.3 F (37.4 C)] 98.5 F (36.9 C) (11/05 0744) Pulse Rate:  [92-108] 95 (11/05 0744) Resp:  [16-20] 16 (11/05 0744) BP: (105-128)/(74-89) 110/75 (11/05 0744) SpO2:  [93 %-100 %] 95 % (11/05 0744) Weight:  [86.9 kg] 86.9 kg (11/05 0500) Last BM Date: 01/27/21  Intake/Output from previous day: 11/04 0701 - 11/05 0700 In: 56 [P.O.:390; IV Piggyback:100] Out: 370 [Urine:350; Drains:20] Intake/Output this shift: No intake/output data recorded.  PE: Gen:  resting comfortably, NAD Pulm:  rate and effort normal Abd: Soft, mild distension, nontender, cdi dressing to midline wound, drain with with dark bloody fluid  Lab Results:  Recent Labs    01/27/21 0354 01/28/21 0355  WBC 11.2* 11.1*  HGB 8.7* 8.8*  HCT 27.9* 28.8*  PLT 608* 592*   BMET Recent Labs    01/26/21 0346 01/27/21 0354  NA 137 138  K 3.8 3.8  CL 104 104  CO2 26 25  GLUCOSE 93 115*  BUN 22 23  CREATININE 1.14 1.10  CALCIUM 9.6 9.8   PT/INR No results for input(s): LABPROT, INR in the last 72 hours. CMP     Component Value Date/Time   NA 138 01/27/2021 0354   NA 138 06/20/2020 1034   NA 141 04/20/2014 1019   K 3.8 01/27/2021 0354   K 3.7 04/20/2014 1019   CL 104 01/27/2021 0354   CL 106 04/20/2014 1019   CO2 25 01/27/2021 0354   CO2 30 04/20/2014 1019   GLUCOSE 115 (H) 01/27/2021 0354   GLUCOSE 102 (H) 04/20/2014 1019   BUN 23 01/27/2021 0354   BUN 18 06/20/2020 1034   BUN 14 04/20/2014 1019   CREATININE 1.10 01/27/2021 0354   CREATININE 1.18 04/20/2014 1019   CALCIUM 9.8 01/27/2021 0354   CALCIUM 9.3 04/20/2014 1019    PROT 6.8 01/27/2021 0354   PROT 6.5 06/18/2017 1425   PROT 7.4 04/20/2014 1019   ALBUMIN 1.9 (L) 01/27/2021 0354   ALBUMIN 4.0 06/18/2017 1425   ALBUMIN 3.4 04/20/2014 1019   AST 25 01/27/2021 0354   AST 21 04/20/2014 1019   ALT 24 01/27/2021 0354   ALT 20 04/20/2014 1019   ALKPHOS 141 (H) 01/27/2021 0354   ALKPHOS 97 04/20/2014 1019   BILITOT 1.1 01/27/2021 0354   BILITOT 0.4 06/18/2017 1425   BILITOT 0.6 04/20/2014 1019   GFRNONAA >60 01/27/2021 0354   GFRNONAA >60 04/20/2014 1019   GFRNONAA >60 11/19/2012 1107   GFRAA 53 (L) 01/13/2020 1410   GFRAA >60 04/20/2014 1019   GFRAA >60 11/19/2012 1107   Lipase     Component Value Date/Time   LIPASE 31 01/01/2021 1023   LIPASE 112 04/20/2014 1019       Studies/Results: No results found.  Anti-infectives: Anti-infectives (From admission, onward)    Start     Dose/Rate Route Frequency Ordered Stop   01/19/21 1200  cefTRIAXone (ROCEPHIN) 2 g in sodium chloride 0.9 % 100 mL IVPB        2 g 200 mL/hr over 30 Minutes Intravenous Every 24 hours 01/19/21 1056     01/19/21  1145  metroNIDAZOLE (FLAGYL) tablet 500 mg        500 mg Oral Every 12 hours 01/19/21 1056     01/18/21 0615  vancomycin (VANCOREADY) IVPB 1250 mg/250 mL  Status:  Discontinued        1,250 mg 166.7 mL/hr over 90 Minutes Intravenous Every 24 hours 01/18/21 0517 01/19/21 1400   01/17/21 1330  Ampicillin-Sulbactam (UNASYN) 3 g in sodium chloride 0.9 % 100 mL IVPB  Status:  Discontinued        3 g 200 mL/hr over 30 Minutes Intravenous Every 8 hours 01/17/21 1239 01/19/21 1056   01/09/21 1100  piperacillin-tazobactam (ZOSYN) IVPB 3.375 g  Status:  Discontinued        3.375 g 12.5 mL/hr over 240 Minutes Intravenous Every 8 hours 01/09/21 1000 01/12/21 0835   01/09/21 0830  ceFEPIme (MAXIPIME) 2 g in sodium chloride 0.9 % 100 mL IVPB  Status:  Discontinued        2 g 200 mL/hr over 30 Minutes Intravenous Every 12 hours 01/09/21 0737 01/09/21 1000   01/08/21  1400  metroNIDAZOLE (FLAGYL) IVPB 500 mg  Status:  Discontinued        500 mg 100 mL/hr over 60 Minutes Intravenous Every 12 hours 01/08/21 1059 01/09/21 1000   01/08/21 1400  ceFEPIme (MAXIPIME) 2 g in sodium chloride 0.9 % 100 mL IVPB  Status:  Discontinued        2 g 200 mL/hr over 30 Minutes Intravenous Every 24 hours 01/08/21 1114 01/09/21 0737   01/08/21 1000  vancomycin (VANCOREADY) IVPB 750 mg/150 mL  Status:  Discontinued        750 mg 150 mL/hr over 60 Minutes Intravenous Every 24 hours 01/07/21 2052 01/09/21 1000   01/08/21 0600  vancomycin (VANCOREADY) IVPB 1500 mg/300 mL  Status:  Discontinued        1,500 mg 150 mL/hr over 120 Minutes Intravenous Every 24 hours 01/07/21 0430 01/07/21 1210   01/08/21 0600  vancomycin (VANCOREADY) IVPB 750 mg/150 mL  Status:  Discontinued        750 mg 150 mL/hr over 60 Minutes Intravenous Every 24 hours 01/07/21 1210 01/07/21 2052   01/07/21 1400  piperacillin-tazobactam (ZOSYN) IVPB 3.375 g  Status:  Discontinued        3.375 g 12.5 mL/hr over 240 Minutes Intravenous Every 8 hours 01/07/21 0430 01/08/21 1114   01/07/21 0530  piperacillin-tazobactam (ZOSYN) IVPB 3.375 g        3.375 g 100 mL/hr over 30 Minutes Intravenous STAT 01/07/21 0430 01/07/21 0622   01/07/21 0530  vancomycin (VANCOREADY) IVPB 1750 mg/350 mL        1,750 mg 175 mL/hr over 120 Minutes Intravenous  Once 01/07/21 0430 01/07/21 0841   01/05/21 1430  Ampicillin-Sulbactam (UNASYN) 3 g in sodium chloride 0.9 % 100 mL IVPB        3 g 200 mL/hr over 30 Minutes Intravenous Every 6 hours 01/05/21 1334 01/06/21 1552   01/01/21 2200  piperacillin-tazobactam (ZOSYN) IVPB 3.375 g        3.375 g 12.5 mL/hr over 240 Minutes Intravenous Every 8 hours 01/01/21 1841 01/04/21 2359   01/01/21 2100  piperacillin-tazobactam (ZOSYN) IVPB 3.375 g  Status:  Discontinued        3.375 g 100 mL/hr over 30 Minutes Intravenous Every 8 hours 01/01/21 1836 01/01/21 1841   01/01/21 1345   piperacillin-tazobactam (ZOSYN) IVPB 3.375 g        3.375 g 100  mL/hr over 30 Minutes Intravenous  Once 01/01/21 1337 01/01/21 1430        Assessment/Plan POD 27, s/p exploratory laparotomy, ileocecectomy, drainage of intraabdominal abscess 01/01/21 Dr. Grandville Silos for perforated appendicitis with abscess - Drain removed 10/24 - d/c vac 11/4. Daily wet to dry dressing changes to midline abdominal wound - Continue PT/OT, SNF vs LTACH. Ok for discharge from surgical standpoint once dispo arranged - on a diet and having bowel function but not eating much. Continue bowel regimen. Encourage PO intake   POD 21, s/p ex lap, splenectomy, application of incisional wound vac 10/14. Dr. Bobbye Morton for splenic laceration - CT 10/24 with complex LUQ fluid collection - s/p IR drain 10/25 yielding dark bloody fluid, culture with E coli resistant to unasyn - switched to Rocephin/ flagyl 10/27.  - post-splenectomy vaccines given 10/28 - CT scan 10/31 showed improving LUQ fluid collection with drain in place, no new acute findings  - Continue drain and antibiotics as above for now, planning for 3 weeks oral antibiotics at discharge. Per IR repeat imaging/possible drain injection once output < 10 mL/QD (excluding flush material.) Awaiting bed at SNF vs [facility] Palliative team is following. "If further decline per family and patient transition to full comfort care."    FEN: TNA stopped 10/30, reg diet VTE: SQH  ID: Zosyn 10/9>> 10/20, unasyn 10/25>>10/27, rocephin/flagyl 10/27>>, vancomycin 10/26>>10/27   Below per primary team: hemorrhagic shock 2/2 above s/p 6 u PRBC, 2 u FFP 10/14 Septic shock 2/2 perforated appendicitis  EtOH abuse Alcohol related cardiomyopathy T2DM Chronic anemia Hx of TIA HTN HLD Gout Hx of GIB CAD Tobacco abuse L pleural effusion s/p thoracentesis 10/25   LOS: 27 days    Wellington Hampshire, [facility] Surgery 01/28/2021, 9:18 AM Please see Amion for pager number  during day hours 7:00am-4:30pm

## 2021-01-28 NOTE — Progress Notes (Signed)
PROGRESS NOTE        PATIENT DETAILS Name: Travis [name] Age: 65 y.o. Sex: male Date of Birth: 1955-12-03 Admit Date: 01/01/2021 Admitting Physician Georganna Skeans, MD XHF:[id number], Alyson Locket, NP  Brief Narrative:  Patient is a 65 y.o. male with history of HFrEF, CKD stage III, COPD, HTN, DM-2, HLD, nonobstructive CAD-who presented with abdominal pain-found to have sepsis from ruptured appendicitis-underwent exploratory laparotomy/ileocecectomy and drainage of intra-abdominal abscess.Patient was briefly managed in the ICU-unfortunately-on 10/14 he developed severe abdominal pain -he was then found to have hemorrhagic shock due to a spontaneous splenic rupture-he was reevaluated by general surgery and underwent a splenectomy.  He was readmitted to the ICU-remained on the ventilator-he self extubated on 10/16-he was monitored closely-upon further stability-transfer to the [facility] hospitalist service on 10/20.  Significant events: 10/09>>admit- ex lap, remained intubated post op-to ICU 10/10>>extubated 10/12>> transferred to [facility] 10/14>>hypotensive/abd pain-CT showed spleenc rupture-to OR for splenectomy. 10/16>> Self-extubated  10/18>> Increased O2 requirement to 8L 10/19>> O2 weaned to 4L 10/20>> transferred to [facility]    Radiology/Echo 10/09>> CT abdomen: Acute appendicitis with perforation 10/14>> CT abdomen: Splenic rupture with large splenic hematoma 10/15>> TTE: EF 45-50% 10/20>> CT abdomen/pelvis: Indeterminate 2.4 cm fluid collection in the right lower quadrant adjacent to the cecal resection site.  Moderate left pleural effusion with compressive atelectasis. 10/24 >> CT - 1. Splenectomy with organizing complex fluid collections in the left upper quadrant. The largest collection there is 162 mL. Smaller adjacent collections, some inseparable from the gastric serosa and with associated mass effect and architectural distortion on the stomach. Again favor  hematoma/seroma. Abscess is not excluded, but there is no gas within the collections. 2. Stable small discontiguous rim enhancing fluid collections in the lower small bowel mesentery at the pelvic inlet a short distance from the percutaneous drain. These have not significantly changed and collectively are less than 10 mL  10/27 >> CT - 1. Interval placement of percutaneous pigtail drain catheter in the left upper quadrant within an air and fluid collection, the dominant component in which the catheter is placed measuring 7.9 x 6.6 cm, previously 8.5 x 6.2 cm. 2. Previously seen small, rim enhancing fluid collection in the right lower quadrant is not significantly changed, measuring 1.5 cm. 3. Redemonstrated postoperative findings status post appendectomy and splenectomy. There has been interval removal of a surgical drain in the right lower quadrant. 4. Cholelithiasis  10/31 CT >> Changes of splenectomy and left upper quadrant percutaneous drainage again identified. Slight interval decrease in size of dominant collection within the left upper quadrant. High attenuation material and punctate foci of gas are nonspecific and may relate to recent catheter evaluation and/or flushing. Postsurgical changes of appendectomy with residual postoperative edema or inflammatory change within the greater omentum. No discrete drainable fluid collection identified. Small left pleural effusion, likely reactive. Associated bibasilar compressive atelectasis, left greater than right. Aortic Atherosclerosis.      Subjective:   No further diarrhea, appetite remains poor, but he would eat/drink his Ensure with encouragement from staff, he did finish 1 Ensure as well this morning after I encouraged him.     Objective: Vitals: Blood pressure 115/82, pulse (!) 108, temperature 98.6 F (37 C), temperature source Oral, resp. rate 19, height 6' (1.829 m), weight 86.9 kg, SpO2 96 %.   Exam:   Alert, Oriented X 3,  currently  ill-appearing, frail no new F.N deficits, Normal affect Symmetrical Chest wall movement, Good air movement bilaterally, CTAB RRR,No Gallops,Rubs or new Murmurs, No Parasternal Heave +ve B.Sounds, Abd Soft, wound VAC has been discontinued, midline surgical wound bandaged, has left upper quadrant drain in place No Cyanosis, Clubbing or edema, No new Rash or bruise        Assessment/Plan:   Sepsis due to acute appendicitis with perforation/intra-abdominal abscesses and Bacteroides bacteremia (s/p laparotomy/ileocecectomy and drainage of intra-abdominal abscess on 10/9) is finished Zosyn course for bacteroides, now growing E. coli from 01/17/2021 in the abscess:  - Sepsis physiology has resolved -   postop he did well and antibiotics were stopped however on 01/16/2021 he started spiking fevers again and IR was consulted, CT scan was repeated, per general surgery he had another left upper quadrant drain placed by IR to drain questionable fluid collection.  Fluid growing E. coli.    He is currently on IV Rocephin and Flagyl combination clinically improved, TNA was be stopped on 01/22/2021 and he is currently tolerating oral diet.  General surgery following.   -He can be discharged on 3 weeks of oral antibiotics once ready for discharge. -Will need close follow-up by IR as an outpatient once ready for discharge, will notify before DC to ensure appropriate follow-ups plans are in place. -Have discussed with the patient again today, I have encouraged him to increase his oral intake, I will increase his Remeron dose today as well.  Plan will be to discharge him once bed is available on oral/IV antibiotics for another 3 weeks with outpatient general surgery IR follow-up, has PICC, if any further decline per family and patient transition to full comfort care, plan discussed with family multiple times.   1 out of 2 blood cultures contaminant with staph epidermis. -This is most likely  contaminant.  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  Acute hypoxic respiratory failure due to atelectasis/pleural effusion: Self extubated on 10/16-stable on anywhere from 2-4 L of oxygen-did develop some transient shortness of breath on 10/20-which responded to bronchodilators/diuretics.    Patient appears to have a moderate-sized left-sided pleural effusion which is likely transudative from-HFrEF/reactive to splenectomy.  PRN Lasix, also IR did ultrasound-guided thoracentesis on 01/17/2021 which was consistent with sympathetic exudative effusion gram stain negative cultures negative  Acute metabolic encephalopathy: Rapidly improving-continue supportive care.  AKI on CKD stage IIIa: AKI hemodynamically mediated-hold Lasix, post hydration and packed RBC transfusion, improved.  HTN: BP stable-continue Coreg-losartan remains on hold  Nonischemic cardiomyopathy/HFrEF: Not in exacerbation-continue Lasix and monitor volume status closely.  Follow electrolytes.   Nonobstructive CAD: No anginal symptoms-already on beta-blocker-we will resume aspirin/statin over the next few days when more stable.  Hypomagnesemia -it is low at 1.6 today, repleted.  Anemia of chronic disease and acute disease.  1 unit of packed RBC transfusion on 01/18/2021, no signs of active bleeding, stable posttransfusion H&H.  HLD: Resume statin/fenofibrate when oral intake was stable.  COPD: Stable-continue bronchodilators  Functional quadriplegia/debility/deconditioning: Due to acute illness-continue PT/OT eval-either SNF or LTAC on discharge.  DM-2 (A1c 6.4 on 06/02/2020): CBGs stable-continue SSI  CBG (last 3)  Recent Labs    01/28/21 0020 01/28/21 0558 01/28/21 1134  GLUCAP 101* 111* 152*     Nutrition Status: Nutrition Problem: Moderate Malnutrition Etiology: acute illness, chronic illness  (perforated appendix, CAD, cardiomyopathy) Signs/Symptoms: mild muscle depletion, mild fat depletion Interventions: Ensure  Enlive (each supplement provides 350kcal and 20 grams of protein), MVI  Obesity: Estimated body mass index is 25.98 kg/m as calculated from the following:   Height as of this encounter: 6' (1.829 m).   Weight as of this encounter: 86.9 kg.    Procedures: 10/9>>exploratory laparotomy, ileocecectomy, drainage of intraabdominal abscess  10/14>> exploratory laparotomy and splenectomy 10/14>> left subclavian triple-lumen catheter 01/17/2021  >> left ultrasound-guided thoracentesis 600 cc exudative fluid, gram stain -ve 01/17/2021  >> LUQ JP drain by IR   Consults: General surgery, PCCM DVT Prophylaxis: Heparin Code Status:DNR Family Communication: discussed with niece Crystal by phone today.  Addendum -per previous MD " Had a peer to peer conversation with Dr Sondra Come from the [facility] on 01/25/21, I explained to Dr. Nicoletta Dress my concerns that patient is extremely frail, has had multiple abdominal surgeries complicated by residual abscess and fluid collection, still has abdominal wound VAC and a left upper quadrant drain and requires another 3 to 4 weeks of antibiotics most likely IV due to lack of oral options.  I also expressed my concerns that patient might not do well at an SNF due to intensity of care and to kindly approve him for an LTAC facility for higher level of care and supervision with an experienced wound care team, onsite surgeon or a visiting surgeon and more frequent physician visits.  I also relayed my feelings that if patient goes to SNF and declines as per previous plan he will be transition to palliative care and he will pass away.  I think the only slim chance of getting better is that he goes to an LTAC for higher level of care as compared to SNF.  Unfortunately I was unable to persuade Dr. Nicoletta Dress to approving an LTAC."  Diet: Diet Order              Diet regular Room service appropriate? Yes; Fluid consistency: Thin  Diet effective now                    Disposition Plan: Status is: Inpatient  Remains inpatient appropriate because: Requires inpatient level of care.   Barriers to Discharge: Perforated appendix with pelvic abscess/splenic rupture-s/p laparotomy/splenectomy-not yet stable for discharge.  MEDICATIONS: Scheduled Meds:  (feeding supplement) PROSource Plus  30 mL Oral TID BM   acetaminophen  1,000 mg Oral Q6H   bisacodyl  10 mg Rectal Once   Chlorhexidine Gluconate Cloth  6 each Topical Daily   docusate sodium  100 mg Oral BID   feeding supplement  237 mL Oral TID BM   heparin injection (subcutaneous)  5,000 Units Subcutaneous Q8H    HYDROmorphone (DILAUDID) injection  0.5 mg Intravenous Once   insulin aspart  0-20 Units Subcutaneous Q6H   mouth rinse  15 mL Mouth Rinse BID   methocarbamol  500 mg Oral QID   metoprolol tartrate  25 mg Oral BID   metroNIDAZOLE  500 mg Oral Q12H   mirtazapine  7.5 mg Oral QHS   multivitamin with minerals  1 tablet Oral Daily   oxyCODONE  20 mg Oral Q12H   pantoprazole  40 mg Oral Daily   polyethylene glycol  17 g Oral Daily   Continuous Infusions:  cefTRIAXone (ROCEPHIN)  IV 2 g (01/28/21 1226)   PRN Meds:.bisacodyl, dextromethorphan, HYDROmorphone (DILAUDID) injection, hydroxypropyl methylcellulose / hypromellose, ipratropium-albuterol, ondansetron (ZOFRAN) IV, oxyCODONE, phenol, simethicone, sodium chloride, sodium chloride flush   I have personally reviewed following labs and imaging studies  LABORATORY DATA:  Recent Labs  Lab 01/24/21 0050 01/25/21 0520 01/26/21 0346 01/27/21 0354 01/28/21 0355  WBC 11.8* 11.3* 10.5 11.2* 11.1*  HGB 8.5* 8.4* 8.6* 8.7* 8.8*  HCT 26.9* 26.8* 28.0* 27.9* 28.8*  PLT 552* 552* 582* 608* 592*  MCV 92.8 92.1 92.7 92.4 92.6  MCH 29.3 28.9 28.5 28.8 28.3  MCHC 31.6 31.3 30.7 31.2 30.6  RDW 16.6* 16.3* 16.5* 16.5* 16.4*     Recent Labs  Lab 01/22/21 0325 01/22/21 0325 01/23/21 0337 01/24/21 0050 01/25/21 0520 01/26/21 0346 01/27/21 0354 01/28/21 0355  NA 139  --  137 138 135 137 138  --   K 3.8  --  4.2 4.3 3.7 3.8 3.8  --   CL 106  --  105 103 102 104 104  --   CO2 26  --  26 25 25 26 25   --   GLUCOSE 180*  --  158* 126* 120* 93 115*  --   BUN 24*  --  24* 24* 22 22 23   --   CREATININE 0.99  --  0.99 1.11 1.18 1.14 1.10  --   CALCIUM 9.4  --  9.5 9.4 9.5 9.6 9.8  --   AST 22  --  23 23 22 25 25   --   ALT 26  --  27 26 23 23 24   --   ALKPHOS 181*  --  185* 182* 146* 151* 141*  --   BILITOT 0.8  --  0.7 0.7 0.5 0.7 1.1  --   ALBUMIN 1.5*  --  1.6* 1.7* 1.8* 1.9* 1.9*  --   MG  --    < > 1.5* 1.8 1.5* 1.8 1.5* 1.6*  PROCALCITON 0.21  --  0.23 0.25 0.22 0.23 0.22 0.21  BNP 77.8  --  73.6  --   --   --   --   --    < > = values in this interval not displayed.     RADIOLOGY STUDIES/RESULTS: No results found.   LOS: 48 days   Signature -  Phillips Climes M.D on 01/28/2021 at 2:43 PM   -  To page go to [email or web address]

## 2021-01-28 NOTE — Progress Notes (Signed)
Pt had large loose stool measuring approximately 300 ml. Dark brown on commode.  States he feels that his stools are getting loose because he is taking in too much protein.  Requires much encouragement to take supplemental protein.  States hospital food "doesn't appeal to me".  Is aware that a significant lack in protein will inhibit the healing process as well as cause muscle wasting.  Pt encouraged to get up to chair, however refused.  Did state he wanted to use the commode, so was given a bath on the commode to enhance his mobility.

## 2021-01-29 DIAGNOSIS — K3532 Acute appendicitis with perforation and localized peritonitis, without abscess: Secondary | ICD-10-CM | POA: Diagnosis not present

## 2021-01-29 DIAGNOSIS — E440 Moderate protein-calorie malnutrition: Secondary | ICD-10-CM | POA: Diagnosis not present

## 2021-01-29 LAB — PROCALCITONIN: Procalcitonin: 0.15 ng/mL

## 2021-01-29 LAB — CBC
HCT: 28.6 % — ABNORMAL LOW (ref 39.0–52.0)
Hemoglobin: 8.8 g/dL — ABNORMAL LOW (ref 13.0–17.0)
MCH: 28.7 pg (ref 26.0–34.0)
MCHC: 30.8 g/dL (ref 30.0–36.0)
MCV: 93.2 fL (ref 80.0–100.0)
Platelets: 583 10*3/uL — ABNORMAL HIGH (ref 150–400)
RBC: 3.07 MIL/uL — ABNORMAL LOW (ref 4.22–5.81)
RDW: 16.6 % — ABNORMAL HIGH (ref 11.5–15.5)
WBC: 13.7 10*3/uL — ABNORMAL HIGH (ref 4.0–10.5)
nRBC: 0.2 % (ref 0.0–0.2)

## 2021-01-29 LAB — GLUCOSE, CAPILLARY
Glucose-Capillary: 115 mg/dL — ABNORMAL HIGH (ref 70–99)
Glucose-Capillary: 116 mg/dL — ABNORMAL HIGH (ref 70–99)
Glucose-Capillary: 119 mg/dL — ABNORMAL HIGH (ref 70–99)
Glucose-Capillary: 123 mg/dL — ABNORMAL HIGH (ref 70–99)

## 2021-01-29 LAB — MAGNESIUM: Magnesium: 1.6 mg/dL — ABNORMAL LOW (ref 1.7–2.4)

## 2021-01-29 MED ORDER — MAGNESIUM SULFATE 2 GM/50ML IV SOLN
2.0000 g | Freq: Once | INTRAVENOUS | Status: AC
  Administered 2021-01-29: 2 g via INTRAVENOUS
  Filled 2021-01-29: qty 50

## 2021-01-29 MED ORDER — SODIUM CHLORIDE 0.9 % IV SOLN: INTRAVENOUS | Status: DC | PRN

## 2021-01-29 MED ORDER — MAGNESIUM SULFATE 4 GM/100ML IV SOLN
4.0000 g | Freq: Once | INTRAVENOUS | Status: DC
Start: 2021-01-29 — End: 2021-01-29

## 2021-01-29 NOTE — Plan of Care (Signed)
  Problem: Clinical Measurements: Goal: Ability to maintain clinical measurements within normal limits will improve Outcome: Progressing   

## 2021-01-29 NOTE — Progress Notes (Signed)
PROGRESS NOTE        PATIENT DETAILS Name: Travis [name] Age: 65 y.o. Sex: male Date of Birth: October 13, 1955 Admit Date: 01/01/2021 Admitting Physician Georganna Skeans, MD [id number], Alyson Locket, NP  Brief Narrative:  Patient is a 65 y.o. male with history of HFrEF, CKD stage III, COPD, HTN, DM-2, HLD, nonobstructive CAD-who presented with abdominal pain-found to have sepsis from ruptured appendicitis-underwent exploratory laparotomy/ileocecectomy and drainage of intra-abdominal abscess.Patient was briefly managed in the ICU-unfortunately-on 10/14 he developed severe abdominal pain -he was then found to have hemorrhagic shock due to a spontaneous splenic rupture-he was reevaluated by general surgery and underwent a splenectomy.  He was readmitted to the ICU-remained on the ventilator-he self extubated on 10/16-he was monitored closely-upon further stability-transfer to the [facility] hospitalist service on 10/20.  Significant events: 10/09>>admit- ex lap, remained intubated post op-to ICU 10/10>>extubated 10/12>> transferred to [facility] 10/14>>hypotensive/abd pain-CT showed spleenc rupture-to OR for splenectomy. 10/16>> Self-extubated  10/18>> Increased O2 requirement to 8L 10/19>> O2 weaned to 4L 10/20>> transferred to [facility]    Radiology/Echo 10/09>> CT abdomen: Acute appendicitis with perforation 10/14>> CT abdomen: Splenic rupture with large splenic hematoma 10/15>> TTE: EF 45-50% 10/20>> CT abdomen/pelvis: Indeterminate 2.4 cm fluid collection in the right lower quadrant adjacent to the cecal resection site.  Moderate left pleural effusion with compressive atelectasis. 10/24 >> CT - 1. Splenectomy with organizing complex fluid collections in the left upper quadrant. The largest collection there is 162 mL. Smaller adjacent collections, some inseparable from the gastric serosa and with associated mass effect and architectural distortion on the stomach. Again favor  hematoma/seroma. Abscess is not excluded, but there is no gas within the collections. 2. Stable small discontiguous rim enhancing fluid collections in the lower small bowel mesentery at the pelvic inlet a short distance from the percutaneous drain. These have not significantly changed and collectively are less than 10 mL  10/27 >> CT - 1. Interval placement of percutaneous pigtail drain catheter in the left upper quadrant within an air and fluid collection, the dominant component in which the catheter is placed measuring 7.9 x 6.6 cm, previously 8.5 x 6.2 cm. 2. Previously seen small, rim enhancing fluid collection in the right lower quadrant is not significantly changed, measuring 1.5 cm. 3. Redemonstrated postoperative findings status post appendectomy and splenectomy. There has been interval removal of a surgical drain in the right lower quadrant. 4. Cholelithiasis  10/31 CT >> Changes of splenectomy and left upper quadrant percutaneous drainage again identified. Slight interval decrease in size of dominant collection within the left upper quadrant. High attenuation material and punctate foci of gas are nonspecific and may relate to recent catheter evaluation and/or flushing. Postsurgical changes of appendectomy with residual postoperative edema or inflammatory change within the greater omentum. No discrete drainable fluid collection identified. Small left pleural effusion, likely reactive. Associated bibasilar compressive atelectasis, left greater than right. Aortic Atherosclerosis.      Subjective:   He did have diarrhea yesterday, but no recurrent, he thinks it is related to ensure, he remains with poor appetite.    Objective: Vitals: Blood pressure 108/74, pulse 87, temperature 98.9 F (37.2 C), temperature source Oral, resp. rate 14, height 6' (1.829 m), weight 82.8 kg, SpO2 96 %.   Exam:  Awake Alert, Oriented X 3, frail, chronically ill-appearing Symmetrical Chest wall movement, Good  air movement  bilaterally, CTAB RRR,No Gallops,Rubs or new Murmurs, No Parasternal Heave +ve B.Sounds, Abd Soft, wound VAC has been discontinued, midline surgical wound bandaged, has left upper quadrant drain in place No Cyanosis, Clubbing or edema, No new Rash or bruise        Assessment/Plan:   Sepsis due to acute appendicitis with perforation/intra-abdominal abscesses and Bacteroides bacteremia (s/p laparotomy/ileocecectomy and drainage of intra-abdominal abscess on 10/9) is finished Zosyn course for bacteroides, now growing E. coli from 01/17/2021 in the abscess:  - Sepsis physiology has resolved -   postop he did well and antibiotics were stopped however on 01/16/2021 he started spiking fevers again and IR was consulted, CT scan was repeated, per general surgery he had another left upper quadrant drain placed by IR to drain questionable fluid collection.  Fluid growing E. coli.    He is currently on IV Rocephin and Flagyl combination clinically improved, TNA was be stopped on 01/22/2021 and he is currently tolerating oral diet.  General surgery following.   -He can be discharged on 3 weeks of oral antibiotics once ready for discharge. -Will need close follow-up by IR as an outpatient once ready for discharge, will notify before DC to ensure appropriate follow-ups plans are in place. - increased his Remeron to see if improves his appetite, he was encouraged again today to increase his intake, including Ensure.  . -Plan to discharge on oral antibiotics for another 3 weeks as an outpatient, with outpatient follow-up with IR and general surgery.  1 out of 2 blood cultures contaminant with staph epidermis. -This is most likely contaminant.  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  Acute hypoxic respiratory failure due to atelectasis/pleural effusion: Self extubated on  10/16-stable on anywhere from 2-4 L of oxygen-did develop some transient shortness of breath on 10/20-which responded to bronchodilators/diuretics.    Patient appears to have a moderate-sized left-sided pleural effusion which is likely transudative from-HFrEF/reactive to splenectomy.  PRN Lasix, also IR did ultrasound-guided thoracentesis on 01/17/2021 which was consistent with sympathetic exudative effusion gram stain negative cultures negative  Acute metabolic encephalopathy: Rapidly improving-continue supportive care.  AKI on CKD stage IIIa: AKI hemodynamically mediated-hold Lasix, post hydration and packed RBC transfusion, improved.  HTN: BP stable-continue Coreg-losartan remains on hold  Nonischemic cardiomyopathy/HFrEF: Not in exacerbation-continue Lasix and monitor volume status closely.  Follow electrolytes.   Nonobstructive CAD: No anginal symptoms-already on beta-blocker-we will resume aspirin/statin over the next few days when more stable.  Hypomagnesemia -potassium is low at 1.6 today, will replete.  Anemia of chronic disease and acute disease.  1 unit of packed RBC transfusion on 01/18/2021, no signs of active bleeding, stable posttransfusion H&H.  HLD: Resume statin/fenofibrate when oral intake was stable.  COPD: Stable-continue bronchodilators  Functional quadriplegia/debility/deconditioning: Due to acute illness-continue PT/OT eval-either SNF or LTAC on discharge.  DM-2 (A1c 6.4 on 06/02/2020): CBGs stable-continue SSI  CBG (last 3)  Recent Labs    01/29/21 0040 01/29/21 0606 01/29/21 1152  GLUCAP 119* 116* 123*     Nutrition Status: Nutrition Problem: Moderate Malnutrition Etiology: acute illness, chronic illness (perforated appendix, CAD, cardiomyopathy) Signs/Symptoms: mild muscle depletion, mild fat depletion Interventions: Ensure Enlive (each supplement provides 350kcal and 20 grams of protein), MVI  Obesity: Estimated body mass index is 24.76 kg/m as  calculated from the following:   Height as of this encounter: 6' (1.829 m).   Weight as of this encounter: 82.8  kg.    Procedures: 10/9>>exploratory laparotomy, ileocecectomy, drainage of intraabdominal abscess  10/14>> exploratory laparotomy and splenectomy 10/14>> left subclavian triple-lumen catheter 01/17/2021  >> left ultrasound-guided thoracentesis 600 cc exudative fluid, gram stain -ve 01/17/2021  >> LUQ JP drain by IR   Consults: General surgery, PCCM DVT Prophylaxis: Heparin Code Status:DNR Family Communication: None at bedside, discussed with niece 11/5.  Addendum -per previous MD " Had a peer to peer conversation with Dr Sondra Come from the [facility] on 01/25/21, I explained to Dr. Nicoletta Dress my concerns that patient is extremely frail, has had multiple abdominal surgeries complicated by residual abscess and fluid collection, still has abdominal wound VAC and a left upper quadrant drain and requires another 3 to 4 weeks of antibiotics most likely IV due to lack of oral options.  I also expressed my concerns that patient might not do well at an SNF due to intensity of care and to kindly approve him for an LTAC facility for higher level of care and supervision with an experienced wound care team, onsite surgeon or a visiting surgeon and more frequent physician visits.  I also relayed my feelings that if patient goes to SNF and declines as per previous plan he will be transition to palliative care and he will pass away.  I think the only slim chance of getting better is that he goes to an LTAC for higher level of care as compared to SNF.  Unfortunately I was unable to persuade Dr. Nicoletta Dress to approving an LTAC."  Diet: Diet Order             Diet regular Room service appropriate? Yes; Fluid consistency: Thin  Diet effective now                    Disposition Plan: Status is: Inpatient  Remains inpatient appropriate because: During LTAC   Barriers to Discharge: Waiting safe  disposition plan  MEDICATIONS: Scheduled Meds:  (feeding supplement) PROSource Plus  30 mL Oral TID BM   acetaminophen  1,000 mg Oral Q6H   bisacodyl  10 mg Rectal Once   Chlorhexidine Gluconate Cloth  6 each Topical Daily   docusate sodium  100 mg Oral BID   feeding supplement  237 mL Oral TID BM   heparin injection (subcutaneous)  5,000 Units Subcutaneous Q8H    HYDROmorphone (DILAUDID) injection  0.5 mg Intravenous Once   insulin aspart  0-20 Units Subcutaneous Q6H   mouth rinse  15 mL Mouth Rinse BID   methocarbamol  500 mg Oral QID   metoprolol tartrate  25 mg Oral BID   metroNIDAZOLE  500 mg Oral Q12H   mirtazapine  15 mg Oral QHS   multivitamin with minerals  1 tablet Oral Daily   oxyCODONE  20 mg Oral Q12H   pantoprazole  40 mg Oral Daily   polyethylene glycol  17 g Oral Daily   Continuous Infusions:  sodium chloride 10 mL/hr at 01/29/21 0610   cefTRIAXone (ROCEPHIN)  IV Stopped (01/29/21 1401)   PRN Meds:.sodium chloride, bisacodyl, dextromethorphan, HYDROmorphone (DILAUDID) injection, hydroxypropyl methylcellulose / hypromellose, ipratropium-albuterol, ondansetron (ZOFRAN) IV, oxyCODONE, phenol, simethicone, sodium chloride, sodium chloride flush   I have personally reviewed following labs and imaging studies  LABORATORY DATA:  Recent Labs  Lab 01/25/21 0520 01/26/21 0346 01/27/21 0354 01/28/21 0355 01/29/21 0417  WBC 11.3* 10.5 11.2* 11.1* 13.7*  HGB 8.4* 8.6* 8.7* 8.8* 8.8*  HCT 26.8* 28.0* 27.9* 28.8* 28.6*  PLT 552* 582*  608* 592* 583*  MCV 92.1 92.7 92.4 92.6 93.2  MCH 28.9 28.5 28.8 28.3 28.7  MCHC 31.3 30.7 31.2 30.6 30.8  RDW 16.3* 16.5* 16.5* 16.4* 16.6*    Recent Labs  Lab 01/23/21 0337 01/24/21 0050 01/25/21 0520 01/26/21 0346 01/27/21 0354 01/28/21 0355 01/29/21 0417  NA 137 138 135 137 138  --   --   K 4.2 4.3 3.7 3.8 3.8  --   --   CL 105 103 102 104 104  --   --   CO2 26 25 25 26 25   --   --   GLUCOSE 158* 126* 120* 93 115*  --    --   BUN 24* 24* 22 22 23   --   --   CREATININE 0.99 1.11 1.18 1.14 1.10  --   --   CALCIUM 9.5 9.4 9.5 9.6 9.8  --   --   AST 23 23 22 25 25   --   --   ALT 27 26 23 23 24   --   --   ALKPHOS 185* 182* 146* 151* 141*  --   --   BILITOT 0.7 0.7 0.5 0.7 1.1  --   --   ALBUMIN 1.6* 1.7* 1.8* 1.9* 1.9*  --   --   MG 1.5* 1.8 1.5* 1.8 1.5* 1.6* 1.6*  PROCALCITON 0.23 0.25 0.22 0.23 0.22 0.21 0.15  BNP 73.6  --   --   --   --   --   --      RADIOLOGY STUDIES/RESULTS: No results found.   LOS: 28 days   Signature -  Phillips Climes M.D on 01/29/2021 at 2:31 PM   -  To page go to [email or web address]

## 2021-01-30 DIAGNOSIS — E440 Moderate protein-calorie malnutrition: Secondary | ICD-10-CM | POA: Diagnosis not present

## 2021-01-30 DIAGNOSIS — K3532 Acute appendicitis with perforation and localized peritonitis, without abscess: Secondary | ICD-10-CM | POA: Diagnosis not present

## 2021-01-30 LAB — GLUCOSE, CAPILLARY
Glucose-Capillary: 105 mg/dL — ABNORMAL HIGH (ref 70–99)
Glucose-Capillary: 148 mg/dL — ABNORMAL HIGH (ref 70–99)
Glucose-Capillary: 154 mg/dL — ABNORMAL HIGH (ref 70–99)
Glucose-Capillary: 147 mg/dL — ABNORMAL HIGH (ref 70–99)

## 2021-01-30 LAB — AEROBIC/ANAEROBIC CULTURE W GRAM STAIN (SURGICAL/DEEP WOUND)

## 2021-01-30 MED ORDER — MIRTAZAPINE 15 MG PO TABS
30.0000 mg | ORAL_TABLET | Freq: Every day | ORAL | Status: DC
  Administered 2021-01-30 – 2021-02-08 (×10): 30 mg via ORAL
  Filled 2021-01-30 (×10): qty 2

## 2021-01-30 NOTE — Progress Notes (Signed)
Referring Physician(s): Dr Dennis Bast  Supervising Physician: Markus Daft  Patient Status:  [facility] - In-pt  Chief Complaint:  POD 27, s/p exploratory laparotomy, ileocecectomy, drainage of intraabdominal abscess 01/01/21 Dr. Grandville Silos for perforated appendicitis with abscess POD 21, s/p ex lap, splenectomy, application of incisional wound vac 10/14. Dr. Bobbye Morton for splenic laceration IR drain placed LUQ abscess 10/25  Subjective:  IR drain intact Up in bed No complaints Drain flushes easily-- OP brown thick fluid Last CT--CT 10/31  OP is over 50 cc daily  For SNF when can  Allergies: Patient has no known allergies.  Medications: Prior to Admission medications   Medication Sig Start Date End Date Taking? Authorizing Provider  albuterol (VENTOLIN HFA) 108 (90 Base) MCG/ACT inhaler Inhale 2 puffs into the lungs every 6 (six) hours as needed for wheezing or shortness of breath.   Yes [provider]  allopurinol (ZYLOPRIM) 100 MG tablet Take 1 tablet (100 mg total) by mouth 2 (two) times daily. 12/19/20  Yes [name], NP  aspirin 325 MG tablet Take 162.5 mg by mouth in the morning.   Yes [provider]  atorvastatin (LIPITOR) 20 MG tablet Take 20 mg by mouth daily.   Yes [provider]  carvedilol (COREG) 6.25 MG tablet Take 1 tablet (6.25 mg total) by mouth 2 (two) times daily. 06/03/20  Yes Theora Gianotti, NP  cetirizine (ZYRTEC) 10 MG tablet Take 1 tablet (10 mg total) by mouth daily. 07/07/20  Yes [name], NP  fenofibrate (TRICOR) 145 MG tablet TAKE 1 TABLET BY MOUTH ONCE A DAY 11/23/20  Yes Dutch Quint B, FNP  ferrous sulfate 325 (65 FE) MG tablet Take 1 tablet (325 mg total) by mouth daily with breakfast. 06/02/20  Yes Baity, Coralie Keens, NP  fluticasone (FLONASE) 50 MCG/ACT nasal spray Place 1-2 sprays into both nostrils daily as needed for allergies or rhinitis.   Yes [provider]  furosemide (LASIX) 40 MG tablet  Take 1 tablet by mouth daily.   Yes [provider]  hydroxypropyl methylcellulose / hypromellose (ISOPTO TEARS / GONIOVISC) 2.5 % ophthalmic solution Place 1 drop into both eyes 3 (three) times daily as needed for dry eyes.   Yes [provider]  losartan (COZAAR) 25 MG tablet Take 0.5 tablets (12.5 mg total) by mouth daily. 12/19/20 03/19/21 Yes [name], NP  magnesium oxide (MAG-OX) 400 MG tablet Take 1 tablet (400 mg total) by mouth daily. 12/19/20  Yes [name], NP  multivitamin (ONE-A-DAY MEN'S) TABS tablet Take 1 tablet by mouth daily with breakfast.   Yes [provider]  Omega-3 Fatty Acids (FISH OIL PO) Take 1 capsule by mouth daily.   Yes [provider]  pantoprazole (PROTONIX) 40 MG tablet Take 1 tablet (40 mg total) by mouth daily. 06/02/20  Yes Baity, Coralie Keens, NP  triamcinolone cream (KENALOG) 0.1 % APPLY TO AFFECTED AREAS TWICE A DAY AS NEEDED. AVOID FACE, GROIN, OR UNDERARMS. Patient taking differently: Apply 1 application topically 2 (two) times daily as needed (dry skin, rash). 11/01/20  Yes Dutch Quint B, FNP     Vital Signs: BP 100/72 (BP Location: Right Arm)   Pulse 87   Temp 98 F (36.7 C) (Oral)   Resp 16   Ht 6' (1.829 m)   Wt 182 lb 8.7 oz (82.8 kg)   SpO2 96%   BMI 24.76 kg/m   Physical Exam Skin:    General: Skin  is warm.     Comments: Site is clean and dry NT - no bleeding No sign of infection OP is thick brown fluid Flushes easily Over 50 cc Op daily  Neurological:     Mental Status: He is alert.    Imaging: No results found.  Labs:  CBC: Recent Labs    01/26/21 0346 01/27/21 0354 01/28/21 0355 01/29/21 0417  WBC 10.5 11.2* 11.1* 13.7*  HGB 8.6* 8.7* 8.8* 8.8*  HCT 28.0* 27.9* 28.8* 28.6*  PLT 582* 608* 592* 583*    COAGS: Recent Labs    01/01/21 1340 01/06/21 0308 01/06/21 0835  INR 1.1 1.3* 1.2  APTT  --   --  26    BMP: Recent Labs    01/24/21 0050 01/25/21 0520  01/26/21 0346 01/27/21 0354  NA 138 135 137 138  K 4.3 3.7 3.8 3.8  CL 103 102 104 104  CO2 25 25 26 25   GLUCOSE 126* 120* 93 115*  BUN 24* 22 22 23   CALCIUM 9.4 9.5 9.6 9.8  CREATININE 1.11 1.18 1.14 1.10  GFRNONAA >60 >60 >60 >60    LIVER FUNCTION TESTS: Recent Labs    01/24/21 0050 01/25/21 0520 01/26/21 0346 01/27/21 0354  BILITOT 0.7 0.5 0.7 1.1  AST 23 22 25 25   ALT 26 23 23 24   ALKPHOS 182* 146* 151* 141*  PROT 6.4* 6.5 6.8 6.8  ALBUMIN 1.7* 1.8* 1.9* 1.9*    Assessment and Plan:  LUQ abscess Drain intact Will follow Will need to flush daily as OP and record output  Electronically Signed: Lavonia Drafts, PA-C 01/30/2021, 2:33 PM   I spent a total of 15 Minutes at the the patient's bedside AND on the patient's hospital floor or unit, greater than 50% of which was counseling/coordinating care for LUQ abscess drain

## 2021-01-30 NOTE — TOC Progression Note (Signed)
Transition of Care [facility]) - Progression Note    Patient Details  Name: Travis [name] MRN: [id number] Date of Birth: 03/03/1956  Transition of Care [facility]) CM/SW [place], LCSW Phone Number: 01/30/2021, 11:23 AM  Clinical Narrative:    LTACH appeal was denied. CSW requested PT see patient for updated notes so that [employer] can begin insurance authorization.    Expected Discharge Plan: Long Term Acute Care (LTAC) Barriers to Discharge: Continued Medical Work up, Orthoptist and Services Expected Discharge Plan: Long Term Acute Care (LTAC) In-house Referral: Clinical Social Work   Post Acute Care Choice: Long Term Acute Care (LTAC) Living arrangements for the past 2 months: Single Family Home                                       Social Determinants of Health (SDOH) Interventions    Readmission Risk Interventions No flowsheet data found.

## 2021-01-30 NOTE — Progress Notes (Signed)
PROGRESS NOTE        PATIENT DETAILS Name: Travis [name] Age: 65 y.o. Sex: male Date of Birth: 1956-02-27 Admit Date: 01/01/2021 Admitting Physician Georganna Skeans, MD ZOX:[id number], Alyson Locket, NP  Brief Narrative:  Patient is a 65 y.o. male with history of HFrEF, CKD stage III, COPD, HTN, DM-2, HLD, nonobstructive CAD-who presented with abdominal pain-found to have sepsis from ruptured appendicitis-underwent exploratory laparotomy/ileocecectomy and drainage of intra-abdominal abscess.Patient was briefly managed in the ICU-unfortunately-on 10/14 he developed severe abdominal pain -he was then found to have hemorrhagic shock due to a spontaneous splenic rupture-he was reevaluated by general surgery and underwent a splenectomy.  He was readmitted to the ICU-remained on the ventilator-he self extubated on 10/16-he was monitored closely-upon further stability-transfer to the [facility] hospital[facility] service on 10/20.  Significant events: 10/09>>admit- ex lap, remained intubated post op-to ICU 10/10>>extubated 10/12>> transferred to [facility] 10/14>>hypotensive/abd pain-CT showed spleenc rupture-to OR for splenectomy. 10/16>> Self-extubated  10/18>> Increased O2 requirement to 8L 10/19>> O2 weaned to 4L 10/20>> transferred to [facility]    Radiology/Echo 10/09>> CT abdomen: Acute appendicitis with perforation 10/14>> CT abdomen: Splenic rupture with large splenic hematoma 10/15>> TTE: EF 45-50% 10/20>> CT abdomen/pelvis: Indeterminate 2.4 cm fluid collection in the right lower quadrant adjacent to the cecal resection site.  Moderate left pleural effusion with compressive atelectasis. 10/24 >> CT - 1. Splenectomy with organizing complex fluid collections in the left upper quadrant. The largest collection there is 162 mL. Smaller adjacent collections, some inseparable from the gastric serosa and with associated mass effect and architectural distortion on the stomach. Again favor  hematoma/seroma. Abscess is not excluded, but there is no gas within the collections. 2. Stable small discontiguous rim enhancing fluid collections in the lower small bowel mesentery at the pelvic inlet a short distance from the percutaneous drain. These have not significantly changed and collectively are less than 10 mL  10/27 >> CT - 1. Interval placement of percutaneous pigtail drain catheter in the left upper quadrant within an air and fluid collection, the dominant component in which the catheter is placed measuring 7.9 x 6.6 cm, previously 8.5 x 6.2 cm. 2. Previously seen small, rim enhancing fluid collection in the right lower quadrant is not significantly changed, measuring 1.5 cm. 3. Redemonstrated postoperative findings status post appendectomy and splenectomy. There has been interval removal of a surgical drain in the right lower quadrant. 4. Cholelithiasis  10/31 CT >> Changes of splenectomy and left upper quadrant percutaneous drainage again identified. Slight interval decrease in size of dominant collection within the left upper quadrant. High attenuation material and punctate foci of gas are nonspecific and may relate to recent catheter evaluation and/or flushing. Postsurgical changes of appendectomy with residual postoperative edema or inflammatory change within the greater omentum. No discrete drainable fluid collection identified. Small left pleural effusion, likely reactive. Associated bibasilar compressive atelectasis, left greater than right. Aortic Atherosclerosis.   Subjective:   No significant events overnight as discussed with staff, denies any complaints.    Objective: Vitals: Blood pressure 100/72, pulse 87, temperature 98 F (36.7 C), temperature source Oral, resp. rate 16, height 6' (1.829 m), weight 82.8 kg, SpO2 96 %.   Exam:  Awake Alert, Oriented X 3, frail, chronically ill-appearing symmetrical Chest wall movement, Good air movement bilaterally, CTAB RRR,No  Gallops,Rubs or new Murmurs, No Parasternal Heave +ve B.Sounds,  Abd Soft, midline surgical wound bandaged, left upper quadrant drain in place  no Cyanosis, Clubbing or edema, No new Rash or bruise         Assessment/Plan:   Sepsis due to acute appendicitis with perforation/intra-abdominal abscesses and Bacteroides bacteremia (s/p laparotomy/ileocecectomy and drainage of intra-abdominal abscess on 10/9) is finished Zosyn course for bacteroides, now growing E. coli from 01/17/2021 in the abscess:  - Sepsis physiology has resolved -   postop he did well and antibiotics were stopped however on 01/16/2021 he started spiking fevers again and IR was consulted, CT scan was repeated, per general surgery he had another left upper quadrant drain placed by IR to drain questionable fluid collection.  Fluid growing E. coli.    He is currently on IV Rocephin and Flagyl combination clinically improved, TNA was be stopped on 01/22/2021 and he is currently tolerating oral diet.  -Can be transitioned to oral antibiotics on discharge, including Vantin and Flagyl.   -Will need close follow-up by IR as an outpatient once ready for discharge, will notify before DC to ensure appropriate follow-ups plans are in place. -Remains with poor appetite, will uptitrate his Remeron further today..  1 out of 2 blood cultures contaminant with staph epidermis. -This is most likely contaminant.  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  Acute hypoxic respiratory failure due to atelectasis/pleural effusion: Self extubated on 10/16-stable on anywhere from 2-4 L of oxygen-did develop some transient shortness of breath on 10/20-which responded to bronchodilators/diuretics.    Patient appears to have a moderate-sized left-sided pleural effusion which is likely transudative from-HFrEF/reactive to splenectomy.  PRN Lasix,  also IR did ultrasound-guided thoracentesis on 01/17/2021 which was consistent with sympathetic exudative effusion gram stain negative cultures negative  Acute metabolic encephalopathy: Rapidly improving-continue supportive care.  AKI on CKD stage IIIa: AKI hemodynamically mediated-hold Lasix, post hydration and packed RBC transfusion, improved.  HTN: BP stable-continue Coreg-losartan remains on hold  Nonischemic cardiomyopathy/HFrEF: Not in exacerbation-continue Lasix and monitor volume status closely.  Follow electrolytes.   Nonobstructive CAD: No anginal symptoms-already on beta-blocker-we will resume aspirin/statin over the next few days when more stable.  Hypomagnesemia  - repleted  Anemia of chronic disease and acute disease.  1 unit of packed RBC transfusion on 01/18/2021, no signs of active bleeding, stable posttransfusion H&H.  HLD: Resume statin/fenofibrate when oral intake was stable.  COPD: Stable-continue bronchodilators  Functional quadriplegia/debility/deconditioning: Due to acute illness-continue PT/OT eval-either SNF or LTAC on discharge.  DM-2 (A1c 6.4 on 06/02/2020): CBGs stable-continue SSI  CBG (last 3)  Recent Labs    01/30/21 0118 01/30/21 0538 01/30/21 1149  GLUCAP 105* 148* 154*     Nutrition Status: Nutrition Problem: Moderate Malnutrition Etiology: acute illness, chronic illness (perforated appendix, CAD, cardiomyopathy) Signs/Symptoms: mild muscle depletion, mild fat depletion Interventions: Ensure Enlive (each supplement provides 350kcal and 20 grams of protein), MVI  Obesity: Estimated body mass index is 24.76 kg/m as calculated from the following:   Height as of this encounter: 6' (1.829 m).   Weight as of this encounter: 82.8 kg.    Procedures: 10/9>>exploratory laparotomy, ileocecectomy, drainage of intraabdominal abscess  10/14>> exploratory laparotomy and splenectomy 10/14>> left subclavian triple-lumen catheter 01/17/2021  >> left  ultrasound-guided thoracentesis 600 cc exudative fluid, gram stain -ve 01/17/2021  >> LUQ JP drain by IR   Consults: General surgery, PCCM DVT Prophylaxis: Heparin Code Status:DNR Family Communication: None at bedside,  discussed with niece 11/5.  Addendum -per previous MD " Had a peer to peer conversation with Dr Sondra Come from the [facility] on 01/25/21, I explained to Dr. Nicoletta Dress my concerns that patient is extremely frail, has had multiple abdominal surgeries complicated by residual abscess and fluid collection, still has abdominal wound VAC and a left upper quadrant drain and requires another 3 to 4 weeks of antibiotics most likely IV due to lack of oral options.  I also expressed my concerns that patient might not do well at an SNF due to intensity of care and to kindly approve him for an LTAC facility for higher level of care and supervision with an experienced wound care team, onsite surgeon or a visiting surgeon and more frequent physician visits.  I also relayed my feelings that if patient goes to SNF and declines as per previous plan he will be transition to palliative care and he will pass away.  I think the only slim chance of getting better is that he goes to an LTAC for higher level of care as compared to SNF.  Unfortunately I was unable to persuade Dr. Nicoletta Dress to approving an LTAC."  Diet: Diet Order             Diet regular Room service appropriate? Yes; Fluid consistency: Thin  Diet effective now                    Disposition Plan: Status is: Inpatient  Insurance has denied LTAC placement, so we will look for subacute rehab.  Barriers to Discharge: Waiting safe disposition plan  MEDICATIONS: Scheduled Meds:  (feeding supplement) PROSource Plus  30 mL Oral TID BM   acetaminophen  1,000 mg Oral Q6H   bisacodyl  10 mg Rectal Once   Chlorhexidine Gluconate Cloth  6 each Topical Daily   docusate sodium  100 mg Oral BID   feeding supplement  237 mL Oral TID BM   heparin  injection (subcutaneous)  5,000 Units Subcutaneous Q8H    HYDROmorphone (DILAUDID) injection  0.5 mg Intravenous Once   insulin aspart  0-20 Units Subcutaneous Q6H   mouth rinse  15 mL Mouth Rinse BID   methocarbamol  500 mg Oral QID   metoprolol tartrate  25 mg Oral BID   metroNIDAZOLE  500 mg Oral Q12H   mirtazapine  15 mg Oral QHS   multivitamin with minerals  1 tablet Oral Daily   oxyCODONE  20 mg Oral Q12H   pantoprazole  40 mg Oral Daily   polyethylene glycol  17 g Oral Daily   Continuous Infusions:  sodium chloride 10 mL/hr at 01/29/21 0610   cefTRIAXone (ROCEPHIN)  IV 2 g (01/30/21 1221)   PRN Meds:.sodium chloride, bisacodyl, dextromethorphan, HYDROmorphone (DILAUDID) injection, hydroxypropyl methylcellulose / hypromellose, ipratropium-albuterol, ondansetron (ZOFRAN) IV, oxyCODONE, phenol, simethicone, sodium chloride, sodium chloride flush   I have personally reviewed following labs and imaging studies  LABORATORY DATA:  Recent Labs  Lab 01/25/21 0520 01/26/21 0346 01/27/21 0354 01/28/21 0355 01/29/21 0417  WBC 11.3* 10.5 11.2* 11.1* 13.7*  HGB 8.4* 8.6* 8.7* 8.8* 8.8*  HCT 26.8* 28.0* 27.9* 28.8* 28.6*  PLT 552* 582* 608* 592* 583*  MCV 92.1 92.7 92.4 92.6 93.2  MCH 28.9 28.5 28.8 28.3 28.7  MCHC 31.3 30.7 31.2 30.6 30.8  RDW 16.3* 16.5* 16.5* 16.4* 16.6*    Recent Labs  Lab 01/24/21 0050 01/25/21 0520 01/26/21 0346 01/27/21 0354 01/28/21 0355 01/29/21 0417  NA 138 135  137 138  --   --   K 4.3 3.7 3.8 3.8  --   --   CL 103 102 104 104  --   --   CO2 25 25 26 25   --   --   GLUCOSE 126* 120* 93 115*  --   --   BUN 24* 22 22 23   --   --   CREATININE 1.11 1.18 1.14 1.10  --   --   CALCIUM 9.4 9.5 9.6 9.8  --   --   AST 23 22 25 25   --   --   ALT 26 23 23 24   --   --   ALKPHOS 182* 146* 151* 141*  --   --   BILITOT 0.7 0.5 0.7 1.1  --   --   ALBUMIN 1.7* 1.8* 1.9* 1.9*  --   --   MG 1.8 1.5* 1.8 1.5* 1.6* 1.6*  PROCALCITON 0.25 0.22 0.23 0.22 0.21  0.15     RADIOLOGY STUDIES/RESULTS: No results found.   LOS: 29 days   Signature -  Phillips Climes M.D on 01/30/2021 at 2:36 PM   -  To page go to [email or web address]

## 2021-01-30 NOTE — Progress Notes (Signed)
Progress Note  24 Days Post-Op  Subjective: Patient is working on PO intake and tolerating. Denies nausea. No abdominal pain.   Objective: Vital signs in last 24 hours: Temp:  [98.1 F (36.7 C)-99.2 F (37.3 C)] 98.4 F (36.9 C) (11/07 0753) Pulse Rate:  [85-100] 100 (11/07 0753) Resp:  [11-18] 11 (11/07 0753) BP: (108-124)/(72-76) 115/73 (11/07 0753) SpO2:  [93 %-97 %] 94 % (11/07 0753) Last BM Date: 01/28/21  Intake/Output from previous day: 11/06 0701 - 11/07 0700 In: 954 [P.O.:697; I.V.:97; IV Piggyback:150] Out: 1175 [Urine:1125; Drains:50] Intake/Output this shift: No intake/output data recorded.  PE: Gen: resting comfortably, NAD Pulm: rate and effort normal Abd: Soft, mild distension, nontender, midline wound clean with healthy granulation tissue, drain with with dark bloody fluid   Lab Results:  Recent Labs    01/28/21 0355 01/29/21 0417  WBC 11.1* 13.7*  HGB 8.8* 8.8*  HCT 28.8* 28.6*  PLT 592* 583*   BMET No results for input(s): NA, K, CL, CO2, GLUCOSE, BUN, CREATININE, CALCIUM in the last 72 hours. PT/INR No results for input(s): LABPROT, INR in the last 72 hours. CMP     Component Value Date/Time   NA 138 01/27/2021 0354   NA 138 06/20/2020 1034   NA 141 04/20/2014 1019   K 3.8 01/27/2021 0354   K 3.7 04/20/2014 1019   CL 104 01/27/2021 0354   CL 106 04/20/2014 1019   CO2 25 01/27/2021 0354   CO2 30 04/20/2014 1019   GLUCOSE 115 (H) 01/27/2021 0354   GLUCOSE 102 (H) 04/20/2014 1019   BUN 23 01/27/2021 0354   BUN 18 06/20/2020 1034   BUN 14 04/20/2014 1019   CREATININE 1.10 01/27/2021 0354   CREATININE 1.18 04/20/2014 1019   CALCIUM 9.8 01/27/2021 0354   CALCIUM 9.3 04/20/2014 1019   PROT 6.8 01/27/2021 0354   PROT 6.5 06/18/2017 1425   PROT 7.4 04/20/2014 1019   ALBUMIN 1.9 (L) 01/27/2021 0354   ALBUMIN 4.0 06/18/2017 1425   ALBUMIN 3.4 04/20/2014 1019   AST 25 01/27/2021 0354   AST 21 04/20/2014 1019   ALT 24 01/27/2021 0354    ALT 20 04/20/2014 1019   ALKPHOS 141 (H) 01/27/2021 0354   ALKPHOS 97 04/20/2014 1019   BILITOT 1.1 01/27/2021 0354   BILITOT 0.4 06/18/2017 1425   BILITOT 0.6 04/20/2014 1019   GFRNONAA >60 01/27/2021 0354   GFRNONAA >60 04/20/2014 1019   GFRNONAA >60 11/19/2012 1107   GFRAA 53 (L) 01/13/2020 1410   GFRAA >60 04/20/2014 1019   GFRAA >60 11/19/2012 1107   Lipase     Component Value Date/Time   LIPASE 31 01/01/2021 1023   LIPASE 112 04/20/2014 1019       Studies/Results: No results found.  Anti-infectives: Anti-infectives (From admission, onward)    Start     Dose/Rate Route Frequency Ordered Stop   01/19/21 1200  cefTRIAXone (ROCEPHIN) 2 g in sodium chloride 0.9 % 100 mL IVPB        2 g 200 mL/hr over 30 Minutes Intravenous Every 24 hours 01/19/21 1056     01/19/21 1145  metroNIDAZOLE (FLAGYL) tablet 500 mg        500 mg Oral Every 12 hours 01/19/21 1056     01/18/21 0615  vancomycin (VANCOREADY) IVPB 1250 mg/250 mL  Status:  Discontinued        1,250 mg 166.7 mL/hr over 90 Minutes Intravenous Every 24 hours 01/18/21 0517 01/19/21 1400   01/17/21  1330  Ampicillin-Sulbactam (UNASYN) 3 g in sodium chloride 0.9 % 100 mL IVPB  Status:  Discontinued        3 g 200 mL/hr over 30 Minutes Intravenous Every 8 hours 01/17/21 1239 01/19/21 1056   01/09/21 1100  piperacillin-tazobactam (ZOSYN) IVPB 3.375 g  Status:  Discontinued        3.375 g 12.5 mL/hr over 240 Minutes Intravenous Every 8 hours 01/09/21 1000 01/12/21 0835   01/09/21 0830  ceFEPIme (MAXIPIME) 2 g in sodium chloride 0.9 % 100 mL IVPB  Status:  Discontinued        2 g 200 mL/hr over 30 Minutes Intravenous Every 12 hours 01/09/21 0737 01/09/21 1000   01/08/21 1400  metroNIDAZOLE (FLAGYL) IVPB 500 mg  Status:  Discontinued        500 mg 100 mL/hr over 60 Minutes Intravenous Every 12 hours 01/08/21 1059 01/09/21 1000   01/08/21 1400  ceFEPIme (MAXIPIME) 2 g in sodium chloride 0.9 % 100 mL IVPB  Status:   Discontinued        2 g 200 mL/hr over 30 Minutes Intravenous Every 24 hours 01/08/21 1114 01/09/21 0737   01/08/21 1000  vancomycin (VANCOREADY) IVPB 750 mg/150 mL  Status:  Discontinued        750 mg 150 mL/hr over 60 Minutes Intravenous Every 24 hours 01/07/21 2052 01/09/21 1000   01/08/21 0600  vancomycin (VANCOREADY) IVPB 1500 mg/300 mL  Status:  Discontinued        1,500 mg 150 mL/hr over 120 Minutes Intravenous Every 24 hours 01/07/21 0430 01/07/21 1210   01/08/21 0600  vancomycin (VANCOREADY) IVPB 750 mg/150 mL  Status:  Discontinued        750 mg 150 mL/hr over 60 Minutes Intravenous Every 24 hours 01/07/21 1210 01/07/21 2052   01/07/21 1400  piperacillin-tazobactam (ZOSYN) IVPB 3.375 g  Status:  Discontinued        3.375 g 12.5 mL/hr over 240 Minutes Intravenous Every 8 hours 01/07/21 0430 01/08/21 1114   01/07/21 0530  piperacillin-tazobactam (ZOSYN) IVPB 3.375 g        3.375 g 100 mL/hr over 30 Minutes Intravenous STAT 01/07/21 0430 01/07/21 0622   01/07/21 0530  vancomycin (VANCOREADY) IVPB 1750 mg/350 mL        1,750 mg 175 mL/hr over 120 Minutes Intravenous  Once 01/07/21 0430 01/07/21 0841   01/05/21 1430  Ampicillin-Sulbactam (UNASYN) 3 g in sodium chloride 0.9 % 100 mL IVPB        3 g 200 mL/hr over 30 Minutes Intravenous Every 6 hours 01/05/21 1334 01/06/21 1552   01/01/21 2200  piperacillin-tazobactam (ZOSYN) IVPB 3.375 g        3.375 g 12.5 mL/hr over 240 Minutes Intravenous Every 8 hours 01/01/21 1841 01/04/21 2359   01/01/21 2100  piperacillin-tazobactam (ZOSYN) IVPB 3.375 g  Status:  Discontinued        3.375 g 100 mL/hr over 30 Minutes Intravenous Every 8 hours 01/01/21 1836 01/01/21 1841   01/01/21 1345  piperacillin-tazobactam (ZOSYN) IVPB 3.375 g        3.375 g 100 mL/hr over 30 Minutes Intravenous  Once 01/01/21 1337 01/01/21 1430        Assessment/Plan POD 29, s/p exploratory laparotomy, ileocecectomy, drainage of intraabdominal abscess 01/01/21 Dr.  Grandville Silos for perforated appendicitis with abscess - Drain removed 10/24 - d/c vac 11/4. Daily wet to dry dressing changes to midline abdominal wound - Continue PT/OT, SNF. [place] for discharge from surgical  standpoint once dispo arranged - on a diet and having bowel function but not eating much. Continue bowel regimen. Encourage PO intake   POD 23, s/p ex lap, splenectomy, application of incisional wound vac 10/14. Dr. Bobbye Morton for splenic laceration - CT 10/24 with complex LUQ fluid collection - s/p IR drain 10/25 yielding dark bloody fluid, culture with E coli resistant to unasyn - switched to Rocephin/ flagyl 10/27.  - post-splenectomy vaccines given 10/28 - CT scan 10/31 showed improving LUQ fluid collection with drain in place, no new acute findings  - Continue drain and antibiotics as above for now, planning for 3 weeks oral antibiotics at discharge. Per IR repeat imaging/possible drain injection once output < 10 mL/QD (excluding flush material.) Awaiting bed at SNF, peer to peer for LTACH denied 11/2 Palliative team is following. "If further decline per family and patient transition to full comfort care."    FEN: TNA stopped 10/30, reg diet VTE: [facility]  ID: Zosyn 10/9>10/20, unasyn 10/25>10/27, vancomycin 10/26>10/27 rocephin/flagyl 10/27>>   Below per primary team: hemorrhagic shock 2/2 above s/p 6 u PRBC, 2 u FFP 10/14 Septic shock 2/2 perforated appendicitis  EtOH abuse Alcohol related cardiomyopathy T2DM Chronic anemia Hx of TIA HTN HLD Gout Hx of GIB CAD Tobacco abuse L pleural effusion s/p thoracentesis 10/25  LOS: 29 days    Norm Parcel, [facility] 01/30/2021, 9:59 AM Please see Amion for pager number during day hours 7:00am-4:30pm

## 2021-01-31 DIAGNOSIS — E46 Unspecified protein-calorie malnutrition: Secondary | ICD-10-CM | POA: Diagnosis not present

## 2021-01-31 DIAGNOSIS — K3532 Acute appendicitis with perforation and localized peritonitis, without abscess: Secondary | ICD-10-CM | POA: Diagnosis not present

## 2021-01-31 LAB — GLUCOSE, CAPILLARY
Glucose-Capillary: 114 mg/dL — ABNORMAL HIGH (ref 70–99)
Glucose-Capillary: 117 mg/dL — ABNORMAL HIGH (ref 70–99)
Glucose-Capillary: 120 mg/dL — ABNORMAL HIGH (ref 70–99)
Glucose-Capillary: 126 mg/dL — ABNORMAL HIGH (ref 70–99)
Glucose-Capillary: 129 mg/dL — ABNORMAL HIGH (ref 70–99)
Glucose-Capillary: 133 mg/dL — ABNORMAL HIGH (ref 70–99)

## 2021-01-31 MED ORDER — INSULIN ASPART 100 UNIT/ML IJ SOLN
0.0000 [IU] | Freq: Three times a day (TID) | INTRAMUSCULAR | Status: DC
  Administered 2021-01-31 – 2021-02-02 (×3): 3 [IU] via SUBCUTANEOUS
  Administered 2021-02-04: 5 [IU] via SUBCUTANEOUS

## 2021-01-31 MED ORDER — PROSOURCE PLUS PO LIQD
30.0000 mL | Freq: Two times a day (BID) | ORAL | Status: DC
  Administered 2021-02-01 – 2021-02-07 (×14): 30 mL via ORAL
  Filled 2021-01-31 (×14): qty 30

## 2021-01-31 MED ORDER — ENSURE ENLIVE PO LIQD
237.0000 mL | Freq: Two times a day (BID) | ORAL | Status: DC
  Administered 2021-02-02 – 2021-02-07 (×9): 237 mL via ORAL

## 2021-01-31 NOTE — [place] FL2 (Signed)
[place] MEDICAID FL2 LEVEL OF CARE SCREENING TOOL     IDENTIFICATION  Patient Name: Travis [name] Birthdate: 1955/10/07 Sex: male Admission Date (Current Location): 01/01/2021  [facility] and [place] Number:  Herbalist and Address:  The . [facility], [place] [address], [place],  [address]      Provider Number: [id number]  Attending Physician Name and Address:  Elgergawy, Silver Huguenin, MD  Relative Name and Phone Number:       Current Level of Care: Hospital Recommended Level of Care: [place] Prior Approval Number:    Date Approved/Denied:   PASRR Number: [id number]  Discharge Plan: SNF    Current Diagnoses: Patient Active Problem List   Diagnosis Date Noted   Malnutrition of moderate degree 01/05/2021   Ruptured appendicitis 01/01/2021   Sepsis ([place]) 01/01/2021   Status post surgery 01/01/2021   Protein-calorie malnutrition, severe 05/27/2020   Hypotension 05/26/2020   AKI (acute kidney injury) ([place]) 05/26/2020   Alcohol use 11/20/2019   CKD (chronic kidney disease), stage III ([place]) 11/20/2019   TIA (transient ischemic attack) 11/08/2019   Osteoarthritis 05/09/2016   COPD (chronic obstructive pulmonary disease) ([place]) 05/09/2016   Essential hypertension 04/18/2015   Literacy level of illiterate 06/04/2014   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   Cardiomyopathy, nonischemic ([place]) 23/76/2831   Chronic systolic heart failure ([place]) 01/05/2011   Tobacco use 01/05/2011   Diabetes ([place]) 11/30/2008   HLD (hyperlipidemia) 11/30/2008   Gout 11/30/2008    Orientation RESPIRATION BLADDER Height & Weight     Self, Place  O2 (3L Nasal cannula) Continent Weight: 175 lb 11.3 oz (79.7 kg) Height:  6' (182.9 cm)  BEHAVIORAL SYMPTOMS/MOOD NEUROLOGICAL BOWEL NUTRITION STATUS      Continent Diet (see dc summary)  AMBULATORY STATUS COMMUNICATION OF NEEDS Skin   Limited Assist Verbally Surgical wounds, Wound Vac  (Closed incision on abdomen; Will require wound vac to be ordered at [facility])                       [place] Level of Assistance  Bathing, Feeding, Dressing Bathing Assistance: Maximum assistance Feeding assistance: Limited assistance Dressing Assistance: Limited assistance     Functional Limitations Info  Sight Sight Info: Impaired        [place]  PT (By licensed PT), OT (By licensed OT)     PT Frequency: 5x/week OT Frequency: 5x/week            Contractures Contractures Info: Not present    Additional Factors Info  Code Status, Allergies, Insulin Sliding Scale Code Status Info: DNR Allergies Info: NKA   Insulin Sliding Scale Info: See dc summary       Current Medications (01/31/2021):  This is the current hospital active medication list Current Facility-Administered Medications  Medication Dose Route Frequency Provider Last Rate Last Admin   (feeding supplement) PROSource Plus liquid 30 mL  30 mL Oral TID BM Thurnell Lose, MD   30 mL at 01/31/21 0956   0.9 %  sodium chloride infusion   Intravenous PRN Elgergawy, Silver Huguenin, MD 10 mL/hr at 01/29/21 0610 Infusion Verify at 01/29/21 0610   acetaminophen (TYLENOL) tablet 1,000 mg  1,000 mg Oral Q6H Saverio Danker, PA-C   1,000 mg at 01/31/21 5176   bisacodyl (DULCOLAX) suppository 10 mg  10 mg Rectal Daily PRN Meuth, Brooke A, PA-C       bisacodyl (DULCOLAX) suppository  10 mg  10 mg Rectal Once Meuth, Brooke A, PA-C       cefTRIAXone (ROCEPHIN) 2 g in sodium chloride 0.9 % 100 mL IVPB  2 g Intravenous Q24H Thurnell Lose, MD 200 mL/hr at 01/30/21 1221 2 g at 01/30/21 1221   Chlorhexidine Gluconate Cloth 2 % PADS 6 each  6 each Topical Daily Georganna Skeans, MD   6 each at 01/31/21 0958   dextromethorphan (DELSYM) 30 MG/5ML liquid 15 mg  15 mg Per Tube QHS PRN Simonne Maffucci B, MD   15 mg at 01/09/21 2053   docusate sodium (COLACE) capsule 100 mg  100 mg Oral BID Meuth,  Brooke A, PA-C   100 mg at 01/31/21 0956   feeding supplement (ENSURE ENLIVE / ENSURE PLUS) liquid 237 mL  237 mL Oral TID BM Thurnell Lose, MD   237 mL at 01/31/21 0958   heparin injection 5,000 Units  5,000 Units Subcutaneous Q8H Monia Sabal, PA-C   5,000 Units at 01/31/21 2694   HYDROmorphone (DILAUDID) injection 0.5 mg  0.5 mg Intravenous Q6H PRN Romana Juniper A, MD   0.5 mg at 01/29/21 2016   HYDROmorphone (DILAUDID) injection 0.5 mg  0.5 mg Intravenous Once Alcario Drought, Jared M, DO       hydroxypropyl methylcellulose / hypromellose (ISOPTO TEARS / GONIOVISC) 2.5 % ophthalmic solution 1 drop  1 drop Both Eyes TID PRN Marcelyn Bruins, MD       insulin aspart (novoLOG) injection 0-20 Units  0-20 Units Subcutaneous Q6H Don[place]amae Jude, [facility]   3 Units at 01/30/21 1831   ipratropium-albuterol (DUONEB) 0.5-2.5 (3) MG/3ML nebulizer solution 3 mL  3 mL Nebulization Q6H PRN Thurnell Lose, MD   3 mL at 01/17/21 0147   MEDLINE mouth rinse  15 mL Mouth Rinse BID Maryjane Hurter, MD   15 mL at 01/31/21 0958   methocarbamol (ROBAXIN) tablet 500 mg  500 mg Oral QID Saverio Danker, PA-C   500 mg at 01/31/21 0957   metoprolol tartrate (LOPRESSOR) tablet 25 mg  25 mg Oral BID Thurnell Lose, MD   25 mg at 01/31/21 0957   metroNIDAZOLE (FLAGYL) tablet 500 mg  500 mg Oral Q12H Thurnell Lose, MD   500 mg at 01/31/21 0956   mirtazapine (REMERON) tablet 30 mg  30 mg Oral QHS Elgergawy, Silver Huguenin, MD   30 mg at 01/30/21 2301   multivitamin with minerals tablet 1 tablet  1 tablet Oral Daily Elgergawy, Silver Huguenin, MD   1 tablet at 01/31/21 0956   ondansetron (ZOFRAN) injection 4 mg  4 mg Intravenous Q6H PRN Barkley Boards R, PA-C   4 mg at 01/28/21 2125   oxyCODONE (Oxy IR/ROXICODONE) immediate release tablet 5 mg  5 mg Oral Q6H PRN Romana Juniper A, MD   5 mg at 01/30/21 0544   oxyCODONE (OXYCONTIN) 12 hr tablet 20 mg  20 mg Oral Q12H Rosezella Rumpf, NP   20 mg at 01/31/21 0956   pantoprazole  (PROTONIX) EC tablet 40 mg  40 mg Oral Daily Thurnell Lose, MD   40 mg at 01/31/21 0956   phenol (CHLORASEPTIC) mouth spray 1 spray  1 spray Mouth/Throat PRN Elsie Lincoln, MD   1 spray at 01/09/21 2305   polyethylene glycol (MIRALAX / GLYCOLAX) packet 17 g  17 g Oral Daily Meuth, Brooke A, PA-C   17 g at 01/27/21 0905   simethicone (MYLICON) chewable tablet 80 mg  80  mg Oral QID PRN Etta Quill, DO   80 mg at 01/22/21 2058   sodium chloride (OCEAN) 0.65 % nasal spray 1 spray  1 spray Each Nare PRN Thurnell Lose, MD   1 spray at 01/20/21 6812   sodium chloride flush (NS) 0.9 % injection 10-40 mL  10-40 mL Intracatheter PRN Juanito Doom, MD   10 mL at 01/20/21 0455     Discharge Medications: Please see discharge summary for a list of discharge medications.  Relevant Imaging Results:  Relevant Lab Results:   Additional Information SSN: [id number]. [place] COVID-19 Vaccine 07/04/2019 , 06/13/2019  Benard Halsted, LCSW

## 2021-01-31 NOTE — Progress Notes (Addendum)
PROGRESS NOTE        PATIENT DETAILS Name: Travis [name] Age: 65 y.o. Sex: male Date of Birth: 13-Jan-1956 Admit Date: 01/01/2021 Admitting Physician Georganna Skeans, MD QIH:[id number], Alyson Locket, NP  Brief Narrative:  Patient is a 65 y.o. male with history of HFrEF, CKD stage III, COPD, HTN, DM-2, HLD, nonobstructive CAD-who presented with abdominal pain-found to have sepsis from ruptured appendicitis-underwent exploratory laparotomy/ileocecectomy and drainage of intra-abdominal abscess.Patient was briefly managed in the ICU-unfortunately-on 10/14 he developed severe abdominal pain -he was then found to have hemorrhagic shock due to a spontaneous splenic rupture-he was reevaluated by general surgery and underwent a splenectomy.  He was readmitted to the ICU-remained on the ventilator-he self extubated on 10/16-he was monitored closely-upon further stability-transfer to the [facility] hospitalist service on 10/20.  Significant events: 10/09>>admit- ex lap, remained intubated post op-to ICU 10/10>>extubated 10/12>> [facility] to [facility] 10/14>>hypotensive/abd pain-CT showed spleenc rupture-to OR for splenectomy. 10/16>> Self-extubated  10/18>> Increased O2 requirement to 8L 10/19>> O2 weaned to 4L 10/20>> [facility]    Radiology/Echo 10/09>> CT abdomen: Acute appendicitis with perforation 10/14>> CT abdomen: Splenic rupture with large splenic hematoma 10/15>> TTE: EF 45-50% 10/20>> CT abdomen/pelvis: Indeterminate 2.4 cm fluid collection in the right lower quadrant adjacent to the cecal resection site.  Moderate left pleural effusion with compressive atelectasis. 10/24 >> CT - 1. Splenectomy with organizing complex fluid collections in the left upper quadrant. The largest collection there is 162 mL. Smaller adjacent collections, some inseparable from the gastric serosa and with associated mass effect and architectural distortion on the stomach. Again favor  hematoma/seroma. Abscess is not excluded, but there is no gas within the collections. 2. Stable small discontiguous rim enhancing fluid collections in the lower small bowel mesentery at the pelvic inlet a short distance from the percutaneous drain. These have not significantly changed and collectively are less than 10 mL  10/27 >> CT - 1. Interval placement of percutaneous pigtail drain catheter in the left upper quadrant within an air and fluid collection, the dominant component in which the catheter is placed measuring 7.9 x 6.6 cm, previously 8.5 x 6.2 cm. 2. Previously seen small, rim enhancing fluid collection in the right lower quadrant is not significantly changed, measuring 1.5 cm. 3. Redemonstrated postoperative findings status post appendectomy and splenectomy. There has been interval removal of a surgical drain in the right lower quadrant. 4. Cholelithiasis  10/31 CT >> Changes of splenectomy and left upper quadrant percutaneous drainage again identified. Slight interval decrease in size of dominant collection within the left upper quadrant. High attenuation material and punctate foci of gas are nonspecific and may relate to recent catheter evaluation and/or flushing. Postsurgical changes of appendectomy with residual postoperative edema or inflammatory change within the greater omentum. No discrete drainable fluid collection identified. Small left pleural effusion, likely reactive. Associated bibasilar compressive atelectasis, left greater than right. Aortic Atherosclerosis.   Subjective:   No significant events overnight as discussed with staff, denies any complaints.  Overall appetite has improved, but patient like outside food which is brought by the family.    Objective: Vitals: Blood pressure 120/74, pulse 90, temperature 98.5 F (36.9 C), temperature source Oral, resp. rate 18, height 6' (1.829 m), weight 79.7 kg, SpO2 97 %.   Exam:   Awake Alert, Oriented X 3, frail, chronically  ill-appearing, no new  F.N deficits, Normal affect Symmetrical Chest wall movement, Good air movement bilaterally, CTAB RRR,No Gallops,Rubs or new Murmurs, No Parasternal Heave +ve B.Sounds, Abd Soft, midline surgical wound bandaged. No Cyanosis, Clubbing or edema, No new Rash or bruise        Assessment/Plan:   Sepsis due to acute appendicitis with perforation/intra-abdominal abscesses and Bacteroides bacteremia (s/p laparotomy/ileocecectomy and drainage of intra-abdominal abscess on 10/9) is finished Zosyn course for bacteroides, now growing E. coli from 01/17/2021 in the abscess:  - Sepsis physiology has resolved  -   postop he did well and antibiotics were stopped however on 01/16/2021 he started spiking fevers again and IR was consulted, CT scan was repeated, per general surgery he had another left upper quadrant drain placed by IR to drain questionable fluid collection.  Fluid growing E. coli.    He is currently on IV Rocephin and Flagyl combination clinically improved, TNA was be stopped on 01/22/2021 and he is currently tolerating oral diet.  -Can be transitioned to oral antibiotics on discharge,Vantin and Flagyl.  Likely will need an additional 10 to 14 days. -Will need close follow-up by IR as an outpatient once ready for discharge, will notify before DC to ensure appropriate follow-ups plans are in place. -Remains with poor appetite, will uptitrate his Remeron further today.[name]  Appetite is very poor, but it is slowly improvement, I think increasing Remeron, and allowing family to bring in food from outside has improved his oral intake.  1 out of 2 blood cultures contaminant with staph epidermis. -This is most likely contaminant.  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  Acute hypoxic respiratory failure due to atelectasis/pleural effusion: Self extubated on 10/16-stable  on anywhere from 2-4 L of oxygen-did develop some transient shortness of breath on 10/20-which responded to bronchodilators/diuretics.    Patient appears to have a moderate-sized left-sided pleural effusion which is likely transudative from-HFrEF/reactive to splenectomy.  PRN Lasix, also IR did ultrasound-guided thoracentesis on 01/17/2021 which was consistent with sympathetic exudative effusion gram stain negative cultures negative  Acute metabolic encephalopathy: Rapidly improving-continue supportive care.  AKI on CKD stage IIIa: AKI hemodynamically mediated-hold Lasix, post hydration and packed RBC transfusion, improved.  HTN: BP stable-continue Coreg-losartan remains on hold  Nonischemic cardiomyopathy/HFrEF: Not in exacerbation-continue Lasix and monitor volume status closely.  Follow electrolytes.   Nonobstructive CAD: No anginal symptoms-already on beta-blocker-we will resume aspirin/statin over the next few days when more stable.  Hypomagnesemia  - repleted  Anemia of chronic disease and acute disease.  1 unit of packed RBC transfusion on 01/18/2021, no signs of active bleeding, stable posttransfusion H&H.  HLD: Resume statin/fenofibrate when oral intake was stable.  COPD: Stable-continue bronchodilators  Functional quadriplegia/debility/deconditioning: Due to acute illness-continue PT/OT eval-either SNF or LTAC on discharge.  DM-2 (A1c 6.4 on 06/02/2020): CBGs stable-continue SSI  CBG (last 3)  Recent Labs    01/31/21 0033 01/31/21 0602 01/31/21 0920  GLUCAP 120* 114* 133*     Nutrition Status: Nutrition Problem: Moderate Malnutrition Etiology: acute illness, chronic illness (perforated appendix, CAD, cardiomyopathy) Signs/Symptoms: mild muscle depletion, mild fat depletion Interventions: Ensure Enlive (each supplement provides 350kcal and 20 grams of protein), MVI  Obesity: Estimated body mass index is 23.83 kg/m as calculated from the following:   Height as of  this encounter: 6' (1.829 m).   Weight as of this encounter: 79.7 kg.    Procedures: 10/9>>exploratory laparotomy, ileocecectomy, drainage of  intraabdominal abscess  10/14>> exploratory laparotomy and splenectomy 10/14>> left subclavian triple-lumen catheter 01/17/2021  >> left ultrasound-guided thoracentesis 600 cc exudative fluid, gram stain -ve 01/17/2021  >> LUQ JP drain by IR   Consults: General surgery, PCCM DVT Prophylaxis: Heparin Code Status:DNR Family Communication: discussed with niece by phone 11/5.  Discussed with sister in law at bedside today    Diet: Diet Order             Diet regular Room service appropriate? Yes; Fluid consistency: Thin  Diet effective now                    Disposition Plan: Status is: Inpatient  Insurance has denied LTAC placement, so plan to discharge to subacute rehab  Barriers to Discharge: Waiting safe disposition plan  MEDICATIONS: Scheduled Meds:  (feeding supplement) PROSource Plus  30 mL Oral TID BM   acetaminophen  1,000 mg Oral Q6H   bisacodyl  10 mg Rectal Once   Chlorhexidine Gluconate Cloth  6 each Topical Daily   docusate sodium  100 mg Oral BID   feeding supplement  237 mL Oral TID BM   heparin injection (subcutaneous)  5,000 Units Subcutaneous Q8H    HYDROmorphone (DILAUDID) injection  0.5 mg Intravenous Once   insulin aspart  0-20 Units Subcutaneous Q6H   mouth rinse  15 mL Mouth Rinse BID   methocarbamol  500 mg Oral QID   metoprolol tartrate  25 mg Oral BID   metroNIDAZOLE  500 mg Oral Q12H   mirtazapine  30 mg Oral QHS   multivitamin with minerals  1 tablet Oral Daily   oxyCODONE  20 mg Oral Q12H   pantoprazole  40 mg Oral Daily   polyethylene glycol  17 g Oral Daily   Continuous Infusions:  sodium chloride 10 mL/hr at 01/29/21 0610   cefTRIAXone (ROCEPHIN)  IV 2 g (01/30/21 1221)   PRN Meds:.sodium chloride, bisacodyl, dextromethorphan, HYDROmorphone (DILAUDID) injection, hydroxypropyl  methylcellulose / hypromellose, ipratropium-albuterol, ondansetron (ZOFRAN) IV, oxyCODONE, phenol, simethicone, sodium chloride, sodium chloride flush   I have personally reviewed following labs and imaging studies  LABORATORY DATA:  Recent Labs  Lab 01/25/21 0520 01/26/21 0346 01/27/21 0354 01/28/21 0355 01/29/21 0417  WBC 11.3* 10.5 11.2* 11.1* 13.7*  HGB 8.4* 8.6* 8.7* 8.8* 8.8*  HCT 26.8* 28.0* 27.9* 28.8* 28.6*  PLT 552* 582* 608* 592* 583*  MCV 92.1 92.7 92.4 92.6 93.2  MCH 28.9 28.5 28.8 28.3 28.7  MCHC 31.3 30.7 31.2 30.6 30.8  RDW 16.3* 16.5* 16.5* 16.4* 16.6*    Recent Labs  Lab 01/25/21 0520 01/26/21 0346 01/27/21 0354 01/28/21 0355 01/29/21 0417  NA 135 137 138  --   --   K 3.7 3.8 3.8  --   --   CL 102 104 104  --   --   CO2 25 26 25   --   --   GLUCOSE 120* 93 115*  --   --   BUN 22 22 23   --   --   CREATININE 1.18 1.14 1.10  --   --   CALCIUM 9.5 9.6 9.8  --   --   AST 22 25 25   --   --   ALT 23 23 24   --   --   ALKPHOS 146* 151* 141*  --   --   BILITOT 0.5 0.7 1.1  --   --   ALBUMIN 1.8* 1.9* 1.9*  --   --  MG 1.5* 1.8 1.5* 1.6* 1.6*  PROCALCITON 0.22 0.23 0.22 0.21 0.15     RADIOLOGY STUDIES/RESULTS: No results found.   LOS: 30 days   Signature -  Phillips Climes M.D on 01/31/2021 at 12:00 PM   -  To page go to [email or web address]

## 2021-01-31 NOTE — Progress Notes (Signed)
Physical Therapy Treatment Patient Details Name: Travis [name] MRN: [id number] DOB: 1956/03/22 Today's Date: 01/31/2021   History of Present Illness Pt is a 65 y/o male admitted 10/9 with complaints of R lower quadrant abdominal pain. Found with intra-abdominal sepsis with perforated appendix and abdominal abscess; s/p diagnostic laparoscopy, ileocecectomy, drainage of interaabdominal abscess 10/9. Post op ileus. 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 Pt self extubated 10/16. L thoracentesis 10/25 with 600cc drained. PMHx: CHF, CKD, COPD, TIA, diabetes, HTN, gout, CAD.    PT Comments    Patient demonstrating excellent improvement over last two sessions. Agreeable to therapy; stating he wants to get stronger. Limited by back pain but able to increase walking distance to 50 ft with RW and min assist.     Recommendations for follow up therapy are one component of a multi-disciplinary discharge planning process, led by the attending physician.  Recommendations may be updated based on patient status, additional functional criteria and insurance authorization.  Follow Up Recommendations  Skilled nursing-short term rehab (<3 hours/day)     Assistance Recommended at Discharge Frequent or constant Supervision/Assistance  Equipment Recommendations  Rolling walker (2 wheels)    Recommendations for Other Services       Precautions / Restrictions Precautions Precautions: Fall Precaution Comments: abdominal wound vac d/c'd 11/4 Restrictions Weight Bearing Restrictions: No     Mobility  Bed Mobility Overal bed mobility: Needs Assistance Bed Mobility: Supine to Sit     Supine to sit: HOB elevated;Supervision Sit to supine: Supervision   General bed mobility comments: no rail; incr time due to moving slowly    Transfers Overall transfer level: Needs assistance Equipment used: Rolling walker (2 wheels) Transfers: Sit to/from Stand Sit to Stand: Min guard            General transfer comment: cues for sequencing, increased time to power up    Ambulation/Gait Ambulation/Gait assistance: Min assist Gait Distance (Feet): 50 Feet Assistive device: Rolling walker (2 wheels) Gait Pattern/deviations: Step-through pattern;Decreased stride length;Trunk flexed Gait velocity: decreased     General Gait Details: slow, guarded gait. Distance limited by back pain.   Stairs             Wheelchair Mobility    Modified Rankin (Stroke Patients Only)       Balance Overall balance assessment: Needs assistance Sitting-balance support: Feet supported;No upper extremity supported Sitting balance-Leahy Scale: Good     Standing balance support: Bilateral upper extremity supported;During functional activity Standing balance-Leahy Scale: Poor Standing balance comment: reliant on external support                            Cognition Arousal/Alertness: Awake/alert Behavior During Therapy: Flat affect Overall Cognitive Status: No family/caregiver present to determine baseline cognitive functioning                                 General Comments: WFL with conversation; following simple instructions        Exercises      General Comments        Pertinent Vitals/Pain Pain Assessment: Faces Faces Pain Scale: Hurts little more Pain Location: back Pain Descriptors / Indicators: Discomfort Pain Intervention(s): Limited activity within patient's tolerance;Repositioned    Home Living  Prior Function            PT Goals (current goals can now be found in the care plan section) Acute Rehab PT Goals Patient Stated Goal: not stated PT Goal Formulation: With patient Time For Goal Achievement: 02/07/21 Potential to Achieve Goals: Fair Progress towards PT goals: Progressing toward goals    Frequency    Min 2X/week      PT Plan Current plan remains appropriate     Co-evaluation              AM-PAC PT "6 Clicks" Mobility   Outcome Measure  Help needed turning from your back to your side while in a flat bed without using bedrails?: A Little Help needed moving from lying on your back to sitting on the side of a flat bed without using bedrails?: A Little Help needed moving to and from a bed to a chair (including a wheelchair)?: A Little Help needed standing up from a chair using your arms (e.g., wheelchair or bedside chair)?: A Little Help needed to walk in hospital room?: A Little Help needed climbing 3-5 steps with a railing? : A Lot 6 Click Score: 17    End of Session Equipment Utilized During Treatment: Gait belt Activity Tolerance: Patient limited by pain Patient left: with call bell/phone within reach;in bed;with bed alarm set Nurse Communication: Mobility status PT Visit Diagnosis: Difficulty in walking, not elsewhere classified (R26.2);Other abnormalities of gait and mobility (R26.89);Muscle weakness (generalized) (M62.81);Pain Pain - part of body:  (back)     Time: 6213-0865 PT Time Calculation (min) (ACUTE ONLY): 21 min  Charges:  $Gait Training: 8-22 mins                      Arby Barrette, PT [place]  Pager [phone] Office [phone]    Rexanne Mano 01/31/2021, 10:56 AM

## 2021-01-31 NOTE — TOC Progression Note (Addendum)
Transition of Care [facility]) - Progression Note    Patient Details  Name: Travis [name] MRN: [id number] Date of Birth: Oct 04, 1955  Transition of Care [facility]) CM/SW [place], LCSW Phone Number: 01/31/2021, 10:00 AM  Clinical Narrative:    CSW received call from [facility] rescinding their bed offer as they feel patient is too high acuity for them to accept at this time. Patient has no other bed offers at this time. Therapy to work with patient today. CSW will send out updated notes to SNFs.    Expected Discharge Plan: Long Term Acute Care (LTAC) Barriers to Discharge: Continued Medical Work up, Orthoptist and Services Expected Discharge Plan: Long Term Acute Care (LTAC) In-house Referral: Clinical Social Work   Post Acute Care Choice: Long Term Acute Care (LTAC) Living arrangements for the past 2 months: Single Family Home                                       Social Determinants of Health (SDOH) Interventions    Readmission Risk Interventions No flowsheet data found.

## 2021-01-31 NOTE — Progress Notes (Signed)
Nutrition Follow-up  DOCUMENTATION CODES:   Non-severe (moderate) malnutrition in context of chronic illness  INTERVENTION:   Decrease Ensure Enlive to BID, each supplement provides 350 kcal and 20 grams of protein  Decrease 30 mL ProSource Plus to BID, each supplement provides 100 kcal and 15 grams of protein  Continue Multivitamin w/ minerals daily  Encourage good PO intake  NUTRITION DIAGNOSIS:   Moderate Malnutrition related to acute illness, chronic illness (perforated appendix, CAD, cardiomyopathy) as evidenced by mild muscle depletion, mild fat depletion. - Ongoing   GOAL:   Patient will meet greater than or equal to 90% of their needs - Progressing   MONITOR:   PO intake, Supplement acceptance, I & O's, Labs  REASON FOR ASSESSMENT:   Consult Assessment of nutrition requirement/status  ASSESSMENT:   65 yo male admitted with perforated appendix and abdominal abscess. S/P ex lap, ileocecectomy, drainage of intra-abdominal abscess. PMH includes CHF, CKD 3A, COPD, TIA, DM, HTN, gout, HLD, CAD, alcohol abuse, tobacco abuse.  10/9- s/p ex lap, ileocectomy, drainage of intra-abdominal abscess 10/11- s/p ex lap, splenectomy, and incisional wound vac placement 10/16- extubated, TPN initiated 10/20- advanced to clear liquid diet, NGT d/c 10/23- advanced to full liquid diet  10/25- advanced to Heart Health/Carb Modified 10/28- Regular diet 10/30- TPN discontinued   Pt reports that he is eating more, states that he has snacks and that his niece is bringing him food later. Pt reports that he is already a picky eater and that the food from the hospital is not great.  Per EMR, pt intake includes: 11/5: Breakfast 10%, Lunch 0%, Dinner 10% 11/6: Breakfast 30%, Dinner 60%  Reviewed the menu with the pt, expressed that pt is on a regular diet and that he can order anything that he may like. Pt expressed understanding and that he would look over the menu.  Pt stated that he is  drinking 2 ProSource per day and 1 Ensure per day. Discussed with pt to increase and drink 2 Ensure's per day to increase overall caloric intake. Pt agreeable to a goal of drinking 2 Ensure per day, between meals.  Medications reviewed and include: Dulcolax, Colace, SSI 0-20 units q6h, Metronidazole, Robaxin, Remeron, MVI, Oxycodone, Protonix, Miralax, IV antibiotics Labs reviewed: Magnesium 1.6, 24 hr BG trends: 120-154  Admission Weight: 86.8 kg Current Weight: 79.7 kg   Diet Order:   Diet Order             Diet regular Room service appropriate? Yes; Fluid consistency: Thin  Diet effective now                   EDUCATION NEEDS:   No education needs have been identified at this time  Skin:  Skin Assessment: Skin Integrity Issues: Skin Integrity Issues:: Wound VAC Wound Vac: abdomen  Last BM:  01/28/2021  Height:   Ht Readings from Last 1 Encounters:  01/01/21 6' (1.829 m)    Weight:   Wt Readings from Last 1 Encounters:  01/31/21 79.7 kg    Ideal Body Weight:  80.9 kg  BMI:  Body mass index is 23.83 kg/m.  Estimated Nutritional Needs:   Kcal:  2400-2600  Protein:  125-150 grams  Fluid:  > 2 L    Mikaiah Stoffer BS, PLDN Clinical Dietitian See AMiON for contact information.

## 2021-01-31 NOTE — Progress Notes (Signed)
Occupational Therapy Treatment Patient Details Name: Travis [name] MRN: [id number] DOB: [birth year] Today's Date: 01/31/2021   History of present illness Pt is a 65 y/o male admitted 10/9 with complaints of R lower quadrant abdominal pain. Found with intra-abdominal sepsis with perforated appendix and abdominal abscess; s/p diagnostic laparoscopy, ileocecectomy, drainage of interaabdominal abscess 10/9. Post op ileus. 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 Pt self extubated 10/16. L thoracentesis 10/25 with 600cc drained. PMHx: CHF, CKD, COPD, TIA, diabetes, HTN, gout, CAD.   OT comments  Pt supine in bed and agreeable to OT.  Completing bed mobility with supervision, transfers with min guard assist given cueing for hand placement due to decreased carryover.  Min guard for LB dressing.  Fatigues easily and remains limited by back pain.  On 3L during session, VSS.  Will follow acutely.    Recommendations for follow up therapy are one component of a multi-disciplinary discharge planning process, led by the attending physician.  Recommendations may be updated based on patient status, additional functional criteria and insurance authorization.    Follow Up Recommendations  Skilled nursing-short term rehab (<3 hours/day)    Assistance Recommended at Discharge Frequent or constant Supervision/Assistance  Equipment Recommendations  BSC/3in1    Recommendations for Other Services      Precautions / Restrictions Precautions Precautions: Fall Precaution Comments: abdominal wound vac d/c'd 11/4 Restrictions Weight Bearing Restrictions: No       Mobility Bed Mobility Overal bed mobility: Needs Assistance Bed Mobility: Supine to Sit;Sit to Supine     Supine to sit: HOB elevated;Supervision Sit to supine: Supervision   General bed mobility comments: no rail; incr time due to moving slowly    Transfers Overall transfer level: Needs assistance Equipment used: Rolling walker  (2 wheels) Transfers: Sit to/from Stand Sit to Stand: Min guard           General transfer comment: cues for sequencing, increased time to power up     Balance Overall balance assessment: Needs assistance Sitting-balance support: Feet supported;No upper extremity supported Sitting balance-Leahy Scale: Good     Standing balance support: Bilateral upper extremity supported;During functional activity Standing balance-Leahy Scale: Poor Standing balance comment: reliant on external support                           ADL either performed or assessed with clinical judgement   ADL Overall ADL's : Needs assistance/impaired     Grooming: Set up;Sitting               Lower Body Dressing: Min guard;Sit to/from stand   Toilet Transfer: Min guard;Ambulation;Rolling walker (2 wheels)           Functional mobility during ADLs: Min guard;Rolling walker (2 wheels) General ADL Comments: limited by back pain, weakness and balance    Extremity/Trunk Assessment              Vision       Perception     Praxis      Cognition Arousal/Alertness: Awake/alert Behavior During Therapy: Flat affect Overall Cognitive Status: No family/caregiver present to determine baseline cognitive functioning                       Memory: Decreased short-term memory         General Comments: cognition appears [facility] conversationally, pt able to follow directions and engage but did not test formally; decreased carryover of hand placement during  transfers          Exercises     Shoulder Instructions       General Comments pt on 3L [place], VSS    Pertinent Vitals/ Pain       Pain Assessment: Faces Faces Pain Scale: Hurts a little bit Pain Location: back Pain Descriptors / Indicators: Discomfort Pain Intervention(s): Limited activity within patient's tolerance;Monitored during session;Repositioned  Home Living                                           Prior Functioning/Environment              Frequency  Min 2X/week        Progress Toward Goals  OT Goals(current goals can now be found in the care plan section)  Progress towards OT goals: Progressing toward goals     Plan Discharge plan remains appropriate;Frequency remains appropriate    Co-evaluation                 AM-PAC OT "6 Clicks" Daily Activity     Outcome Measure   Help from another person eating meals?: A Little Help from another person taking care of personal grooming?: A Little Help from another person toileting, which includes using toliet, bedpan, or urinal?: A Little Help from another person bathing (including washing, rinsing, drying)?: A Little Help from another person to put on and taking off regular upper body clothing?: A Little Help from another person to put on and taking off regular lower body clothing?: A Little 6 Click Score: 18    End of Session Equipment Utilized During Treatment: Rolling walker (2 wheels);Gait belt  OT Visit Diagnosis: Other abnormalities of gait and mobility (R26.89);Muscle weakness (generalized) (M62.81);Pain;Other symptoms and signs involving cognitive function Pain - part of body:  (back)   Activity Tolerance Patient tolerated treatment well   Patient Left in bed;with call bell/phone within reach;with bed alarm set   Nurse Communication Mobility status        Time: 8299-3716 OT Time Calculation (min): 21 min  Charges: OT General Charges $OT Visit: 1 Visit OT Treatments $Self Care/Home Management : 8-22 mins  Jolaine Artist, OT [place] Pager [phone] Office [place] 01/31/2021, 1:36 PM

## 2021-02-01 DIAGNOSIS — E119 Type 2 diabetes mellitus without complications: Secondary | ICD-10-CM | POA: Diagnosis not present

## 2021-02-01 DIAGNOSIS — K3532 Acute appendicitis with perforation and localized peritonitis, without abscess: Secondary | ICD-10-CM | POA: Diagnosis not present

## 2021-02-01 DIAGNOSIS — R188 Other ascites: Secondary | ICD-10-CM

## 2021-02-01 DIAGNOSIS — E440 Moderate protein-calorie malnutrition: Secondary | ICD-10-CM | POA: Diagnosis not present

## 2021-02-01 LAB — GLUCOSE, CAPILLARY
Glucose-Capillary: 86 mg/dL (ref 70–99)
Glucose-Capillary: 128 mg/dL — ABNORMAL HIGH (ref 70–99)
Glucose-Capillary: 90 mg/dL (ref 70–99)
Glucose-Capillary: 127 mg/dL — ABNORMAL HIGH (ref 70–99)

## 2021-02-01 NOTE — Progress Notes (Signed)
PROGRESS NOTE        PATIENT DETAILS Name: Travis [name] Age: 65 y.o. Sex: male Date of Birth: 09/18/1955 Admit Date: 01/01/2021 Admitting Physician Georganna Skeans, MD WIO:[id number], Alyson Locket, NP  Brief Narrative:  Patient is a 65 y.o. male with history of HFrEF, CKD stage III, COPD, HTN, DM-2, HLD, nonobstructive CAD-who presented with abdominal pain-found to have sepsis from ruptured appendicitis-underwent exploratory laparotomy/ileocecectomy and drainage of intra-abdominal abscess.Patient was briefly managed in the ICU-unfortunately-on 10/14 he developed severe abdominal pain -he was then found to have hemorrhagic shock due to a spontaneous splenic rupture-he was reevaluated by general surgery and underwent a splenectomy.  He was readmitted to the ICU-remained on the ventilator-he self extubated on 10/16-he was monitored closely-upon further stability-transfer to the [facility] hospitalist service on 10/20.  Significant events: 10/09>>admit- ex lap, remained intubated post op-to ICU 10/10>>extubated 10/12>> transferred to [facility] 10/14>>hypotensive/abd pain-CT showed spleenc rupture-to OR for splenectomy. 10/16>> Self-extubated  10/18>> Increased O2 requirement to 8L 10/19>> O2 weaned to 4L 10/20>> transferred to [facility]    Radiology/Echo 10/09>> CT abdomen: Acute appendicitis with perforation 10/14>> CT abdomen: Splenic rupture with large splenic hematoma 10/15>> TTE: EF 45-50% 10/20>> CT abdomen/pelvis: Indeterminate 2.4 cm fluid collection in the right lower quadrant adjacent to the cecal resection site.  Moderate left pleural effusion with compressive atelectasis. 10/24 >> CT abdomen/pelvis: complex fluid collections in the left upper quadrant. The largest collection there is 162 mL.  10/27 >> CT abdomen/pelvis: Placement of pigtail catheter in the left upper quadrant fluid collection. 10/31>> CT abdomen/pelvis: Interval decrease size of the dominant fluid collection  in the left upper quadrant.  Subjective:  Lying comfortably in bed-denies any chest pain or shortness of breath.   Objective:  Vitals: Blood pressure 130/82, pulse 86, temperature 98.7 F (37.1 C), temperature source Oral, resp. rate 20, height 6' (1.829 m), weight 79.7 kg, SpO2 94 %.   Exam: Gen Exam:Alert awake-not in any distress HEENT:atraumatic, normocephalic Chest: B/L clear to auscultation anteriorly CVS:S1S2 regular Abdomen:soft non tender, non distended Extremities:no edema Neurology: Non focal Skin: no rash    Assessment/Plan:   Sepsis due to acute appendicitis with perforation/intra-abdominal abscesses and Bacteroides bacteremia (s/p laparotomy/ileocecectomy and drainage of intra-abdominal abscess on 10/9): Sepsis physiology has resolved-postoperatively-developed left upper quadrant fluid collection-and is s/p drain placement by IR.  On Rocephin/Flagyl (fluid cultures growing E. coli)-with plans to transition to Vantin/Flagyl on discharge for additional 10-14 days.  Staff epidermidis bacteremia: Likely a contaminant and of no clinical significance.   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  Acute hypoxic respiratory failure due to atelectasis/pleural effusion: Self extubated on 10/16-stable on anywhere from 2-4 L of oxygen-did develop some transient shortness of breath on 10/20-which responded to bronchodilators/diuretics.    Patient appears to have a moderate-sized left-sided pleural effusion which is likely transudative from-HFrEF/reactive to splenectomy.  PRN Lasix, also IR did ultrasound-guided thoracentesis on 01/17/2021 which was consistent with sympathetic exudative effusion gram stain negative cultures negative  Acute metabolic encephalopathy: Rapidly improving-continue supportive care.  AKI on CKD stage IIIa: AKI hemodynamically mediated-hold  Lasix, post hydration and packed RBC transfusion, improved.  HTN: BP stable-continue Coreg-losartan remains on hold  Nonischemic cardiomyopathy/HFrEF: Not in exacerbation-continue Lasix and monitor volume status closely.  Follow electrolytes.  Nonobstructive CAD: No anginal symptoms-already on beta-blocker-we will resume aspirin/statin over the next few days when more stable.  Hypomagnesemia: repleted  Multifactorial anemia: Due to combination of splenic rupture, acute illness-has been transfused numerous units of PRBC-hemoglobin now stable.  Follow periodically.    HLD: Resume statin/fenofibrate when oral intake was stable.  COPD: Stable-continue bronchodilators  Functional quadriplegia/debility/deconditioning: Due to acute illness-continue PT/OT eval-either SNF or LTAC on discharge.  DM-2 (A1c 6.4 on 06/02/2020): CBGs stable-continue SSI  Recent Labs    01/31/21 2034 02/01/21 0731 02/01/21 1246  GLUCAP 126* 86 128*    Failure to thrive syndrome: Appetite-functional status slowly improving-continue to encourage oral intake-continue nutritional supplements.   Nutrition Status: Nutrition Problem: Moderate Malnutrition Etiology: acute illness, chronic illness (perforated appendix, CAD, cardiomyopathy) Signs/Symptoms: mild muscle depletion, mild fat depletion Interventions: Ensure Enlive (each supplement provides 350kcal and 20 grams of protein), MVI  BMI: Estimated body mass index is 23.83 kg/m as calculated from the following:   Height as of this encounter: 6' (1.829 m).   Weight as of this encounter: 79.7 kg.    Procedures: 10/9>>exploratory laparotomy, ileocecectomy, drainage of intraabdominal abscess  10/14>> exploratory laparotomy and splenectomy 10/14>> left subclavian triple-lumen catheter 01/17/2021  >> left ultrasound-guided thoracentesis 600 cc exudative fluid, gram stain -ve 01/17/2021  >> LUQ JP drain by IR   Consults: General surgery, PCCM DVT  Prophylaxis: Heparin Code Status:DNR Family Communication: discussed with niece by phone 11/5.  Discussed with sister in law at bedside today    Diet: Diet Order             Diet regular Room service appropriate? Yes; Fluid consistency: Thin  Diet effective now                    Disposition Plan: Status is: Inpatient  Insurance has denied LTAC placement, so plan to discharge to subacute rehab  Barriers to Discharge: Waiting safe disposition plan  MEDICATIONS: Scheduled Meds:  (feeding supplement) PROSource Plus  30 mL Oral BID BM   acetaminophen  1,000 mg Oral Q6H   bisacodyl  10 mg Rectal Once   Chlorhexidine Gluconate Cloth  6 each Topical Daily   docusate sodium  100 mg Oral BID   feeding supplement  237 mL Oral BID BM   heparin injection (subcutaneous)  5,000 Units Subcutaneous Q8H    HYDROmorphone (DILAUDID) injection  0.5 mg Intravenous Once   insulin aspart  0-20 Units Subcutaneous TID WC   mouth rinse  15 mL Mouth Rinse BID   methocarbamol  500 mg Oral QID   metoprolol tartrate  25 mg Oral BID   metroNIDAZOLE  500 mg Oral Q12H   mirtazapine  30 mg Oral QHS   multivitamin with minerals  1 tablet Oral Daily   oxyCODONE  20 mg Oral Q12H   pantoprazole  40 mg Oral Daily   polyethylene glycol  17 g Oral Daily   Continuous Infusions:  sodium chloride 10 mL/hr at 01/29/21 0610   cefTRIAXone (ROCEPHIN)  IV Stopped (01/31/21 1403)   PRN Meds:.sodium chloride, bisacodyl, dextromethorphan, HYDROmorphone (DILAUDID) injection, hydroxypropyl methylcellulose / hypromellose, ipratropium-albuterol, ondansetron (ZOFRAN) IV, oxyCODONE, phenol, simethicone, sodium chloride, sodium chloride flush   I have personally reviewed following labs and imaging studies  LABORATORY DATA:  Recent Labs  Lab 01/26/21 0346 01/27/21 0354 01/28/21 0355 01/29/21 0417  WBC 10.5 11.2* 11.1* 13.7*  HGB 8.6* 8.7* 8.8* 8.8*  HCT 28.0* 27.9* 28.8* 28.6*  PLT 582* 608* 592* 583*  MCV  92.7 92.4 92.6 93.2  MCH 28.5 28.8 28.3 28.7  MCHC 30.7 31.2 30.6 30.8  RDW 16.5* 16.5* 16.4* 16.6*     Recent Labs  Lab 01/26/21 0346 01/27/21 0354 01/28/21 0355 01/29/21 0417  NA 137 138  --   --   K 3.8 3.8  --   --   CL 104 104  --   --   CO2 26 25  --   --   GLUCOSE 93 115*  --   --   BUN 22 23  --   --   CREATININE 1.14 1.10  --   --   CALCIUM 9.6 9.8  --   --   AST 25 25  --   --   ALT 23 24  --   --   ALKPHOS 151* 141*  --   --   BILITOT 0.7 1.1  --   --   ALBUMIN 1.9* 1.9*  --   --   MG 1.8 1.5* 1.6* 1.6*  PROCALCITON 0.23 0.22 0.21 0.15      RADIOLOGY STUDIES/RESULTS: No results found.   LOS: 31 days   Signature -  Oren Binet M.D on 02/01/2021 at 1:21 PM   -  To page go to [email or web address]

## 2021-02-01 NOTE — TOC Progression Note (Signed)
Transition of Care [facility]) - Progression Note    Patient Details  Name: Travis [name] MRN: [id number] Date of Birth: [birth year]  Transition of Care [facility] CM/SW [place], LCSW Phone Number: 02/01/2021, 3:32 PM  Clinical Narrative:    9am-CSW provided updated bed offers to patient's niece. She will speak with family and call CSW back.  3:30pm-Patient's niece has selected Set designer. CSW requested they begin insurance authorization with [facility] as patient is not managed by [employer].    Expected Discharge Plan: Long Term Acute Care (LTAC) Barriers to Discharge: Continued Medical Work up, Orthoptist and Services Expected Discharge Plan: Long Term Acute Care (LTAC) In-house Referral: Clinical Social Work   Post Acute Care Choice: Long Term Acute Care (LTAC) Living arrangements for the past 2 months: Single Family Home                                       Social Determinants of Health (SDOH) Interventions    Readmission Risk Interventions No flowsheet data found.

## 2021-02-02 ENCOUNTER — Other Ambulatory Visit: Payer: Self-pay | Admitting: Physician Assistant

## 2021-02-02 DIAGNOSIS — K3532 Acute appendicitis with perforation and localized peritonitis, without abscess: Secondary | ICD-10-CM | POA: Diagnosis not present

## 2021-02-02 LAB — GLUCOSE, CAPILLARY
Glucose-Capillary: 100 mg/dL — ABNORMAL HIGH (ref 70–99)
Glucose-Capillary: 122 mg/dL — ABNORMAL HIGH (ref 70–99)
Glucose-Capillary: 96 mg/dL (ref 70–99)
Glucose-Capillary: 148 mg/dL — ABNORMAL HIGH (ref 70–99)

## 2021-02-02 LAB — SURGICAL PATHOLOGY

## 2021-02-02 MED ORDER — ALTEPLASE 2 MG IJ SOLR
2.0000 mg | Freq: Once | INTRAMUSCULAR | Status: AC
  Administered 2021-02-02: 2 mg

## 2021-02-02 MED ORDER — ALTEPLASE 2 MG IJ SOLR
2.0000 mg | Freq: Once | INTRAMUSCULAR | Status: AC
  Administered 2021-02-02: 2 mg
  Filled 2021-02-02: qty 2

## 2021-02-02 MED ORDER — ENOXAPARIN SODIUM 40 MG/0.4ML IJ SOSY
40.0000 mg | PREFILLED_SYRINGE | INTRAMUSCULAR | Status: DC
  Administered 2021-02-02 – 2021-02-08 (×7): 40 mg via SUBCUTANEOUS
  Filled 2021-02-02 (×7): qty 0.4

## 2021-02-02 MED ORDER — METOPROLOL TARTRATE 50 MG PO TABS
50.0000 mg | ORAL_TABLET | Freq: Two times a day (BID) | ORAL | Status: DC
  Administered 2021-02-02 – 2021-02-09 (×15): 50 mg via ORAL
  Filled 2021-02-02 (×15): qty 1

## 2021-02-02 NOTE — Progress Notes (Signed)
   02/02/21 0800  Assess: MEWS Score  Temp 98.4 F (36.9 C)  BP 108/79  Pulse Rate (!) 116  ECG Heart Rate (!) 115  Resp (!) 24  Level of Consciousness Alert  SpO2 93 %  O2 Device Room Air  Assess: MEWS Score  MEWS Temp 0  MEWS Systolic 0  MEWS Pulse 2  MEWS RR 1  MEWS LOC 0  MEWS Score 3  MEWS Score Color Yellow  Assess: if the MEWS score is Yellow or Red  Were vital signs taken at a resting state? Yes  Focused Assessment No change from prior assessment  Early Detection of Sepsis Score *See Row Information* Low  MEWS guidelines implemented *See Row Information* Yes  Treat  MEWS Interventions Escalated (See documentation below)  Pain Scale 0-10  Pain Score 0  Take Vital Signs  Increase Vital Sign Frequency  Yellow: Q 2hr X 2 then Q 4hr X 2, if remains yellow, continue Q 4hrs  Escalate  MEWS: Escalate Yellow: discuss with charge nurse/RN and consider discussing with provider and RRT  Notify: Charge Nurse/RN  Name of Charge Nurse/RN Notified Sarah RN  Date Charge Nurse/RN Notified 02/02/21  Time Charge Nurse/RN Notified [phone]  Notify: Provider  Provider Name/Title Dr. Sloan Leiter  Date Provider Notified 02/02/21  Time Provider Notified [phone]  Notification Type Page  Notification Reason Change in status  Provider response No new orders  Date of Provider Response 02/02/21  Time of Provider Response [phone]  Document  Progress note created (see row info) Yes

## 2021-02-02 NOTE — Progress Notes (Signed)
Ok to transition SQ heparin to Lovenox 40mg  SQ qday per Dr Sloan Leiter.  Onnie Boer, PharmD, BCIDP, AAHIVP, CPP Infectious Disease Pharmacist 02/02/2021 10:03 AM

## 2021-02-02 NOTE — Progress Notes (Signed)
Physical Therapy Treatment Patient Details Name: Travis [name] MRN: [id number] DOB: March 31, 1955 Today's Date: 02/02/2021   History of Present Illness Pt is a 65 y/o male admitted 10/9 with complaints of R lower quadrant abdominal pain. Found with intra-abdominal sepsis with perforated appendix and abdominal abscess; s/p diagnostic laparoscopy, ileocecectomy, drainage of interaabdominal abscess 10/9. Post op ileus. 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 Pt self extubated 10/16. L thoracentesis 10/25 with 600cc drained. PMHx: CHF, CKD, COPD, TIA, diabetes, HTN, gout, CAD.    PT Comments    Patient continues to clear mentally, but continues to have delays responding to commands and showing need for continued cues for safe use of RW. Overall his strength and endurance are improving. For discharge home he would need near constant supervision due to decr cognition, rolling walker, and HHPT. Unsure what level of care he will need for the drain. At this time, pt reports he may have a nephew or niece that could come stay with him--otherwise lives alone. Patient met/surpassed PT goals and new goals set.      Recommendations for follow up therapy are one component of a multi-disciplinary discharge planning process, led by the attending physician.  Recommendations may be updated based on patient status, additional functional criteria and insurance authorization.  Follow Up Recommendations  Skilled nursing-short term rehab (<3 hours/day)     Assistance Recommended at Discharge Frequent or constant Supervision/Assistance  Equipment Recommendations  Rolling walker (2 wheels)    Recommendations for Other Services OT consult     Precautions / Restrictions Precautions Precautions: Fall Precaution Comments: LUQ drain     Mobility  Bed Mobility Overal bed mobility: Needs Assistance Bed Mobility: Supine to Sit;Sit to Supine     Supine to sit: HOB elevated;Supervision Sit to supine:  Supervision   General bed mobility comments: no rail; incr time due to moving slowly    Transfers Overall transfer level: Needs assistance Equipment used: Rolling walker (2 wheels) Transfers: Sit to/from Stand Sit to Stand: Min guard           General transfer comment: cues for sequencing, increased time to power up; even with cues does not reach back to sit and "flops" down onto EOB; repeated x 10    Ambulation/Gait Ambulation/Gait assistance: Min assist Gait Distance (Feet): 250 Feet (laps in room; pt does not want to wear a mask to go in tthe hall due to feels it will make him too short of breath) Assistive device: Rolling walker (2 wheels) Gait Pattern/deviations: Step-through pattern;Decreased stride length;Trunk flexed Gait velocity: decreased     General Gait Details: slow, guarded gait. cues for upright posture, however pt reports he cannot due to back pain   Stairs             Wheelchair Mobility    Modified Rankin (Stroke Patients Only)       Balance Overall balance assessment: Needs assistance Sitting-balance support: Feet supported;No upper extremity supported Sitting balance-Leahy Scale: Good     Standing balance support: Bilateral upper extremity supported;During functional activity Standing balance-Leahy Scale: Poor Standing balance comment: reliant on external support                            Cognition Arousal/Alertness: Awake/alert Behavior During Therapy: Flat affect Overall Cognitive Status: No family/caregiver present to determine baseline cognitive functioning Area of Impairment: Attention;Following commands;Problem solving;Awareness;Memory;Safety/judgement  Orientation Level:  (NT) Current Attention Level: Selective Memory: Decreased short-term memory Following Commands: Follows one step commands consistently;Follows one step commands with increased time Safety/Judgement: Decreased awareness of  deficits;Decreased awareness of safety Awareness: Emergent Problem Solving: Decreased initiation;Slow processing;Requires verbal cues;Difficulty sequencing;Requires tactile cues General Comments: talks about "going to school" but with questioning is talking about going to rehab/SNF; decreased carryover of hand placement during transfers        Exercises Other Exercises Other Exercises: sit to stand x 10 reps with cues for correct sequencing with RW; pt did correctly last 3 reps    General Comments General comments (skin integrity, edema, etc.): on room air; pulse ox not reading during ambulation; after seated 93%      Pertinent Vitals/Pain Pain Assessment: Faces Faces Pain Scale: Hurts a little bit Pain Location: back Pain Descriptors / Indicators: Discomfort Pain Intervention(s): Monitored during session;Repositioned    Home Living                          Prior Function            PT Goals (current goals can now be found in the care plan section) Acute Rehab PT Goals Patient Stated Goal: not stated PT Goal Formulation: With patient Time For Goal Achievement: 02/21/21 Potential to Achieve Goals: Good Progress towards PT goals: Goals met and updated - see care plan    Frequency    Min 2X/week      PT Plan Current plan remains appropriate    Co-evaluation              AM-PAC PT "6 Clicks" Mobility   Outcome Measure  Help needed turning from your back to your side while in a flat bed without using bedrails?: A Little Help needed moving from lying on your back to sitting on the side of a flat bed without using bedrails?: A Little Help needed moving to and from a bed to a chair (including a wheelchair)?: A Little Help needed standing up from a chair using your arms (e.g., wheelchair or bedside chair)?: A Little Help needed to walk in hospital room?: A Little Help needed climbing 3-5 steps with a railing? : A Lot 6 Click Score: 17    End of  Session Equipment Utilized During Treatment: Gait belt Activity Tolerance: Patient tolerated treatment well Patient left: with call bell/phone within reach;in bed;with bed alarm set Nurse Communication: Mobility status PT Visit Diagnosis: Difficulty in walking, not elsewhere classified (R26.2);Other abnormalities of gait and mobility (R26.89);Muscle weakness (generalized) (M62.81);Pain Pain - part of body:  (back)     Time: 6979-4801 PT Time Calculation (min) (ACUTE ONLY): 21 min  Charges:  $Gait Training: 8-22 mins                      Arby Barrette, PT Acute Rehabilitation Services  Pager [phone] Office [phone]    Rexanne Mano 02/02/2021, 2:44 PM

## 2021-02-02 NOTE — Progress Notes (Signed)
Progress Note  27 Days Post-Op  Subjective: Patient denies abdominal pain this AM. Reports appetite is so so. Drain output decreasing over last few days.   Objective: Vital signs in last 24 hours: Temp:  [98.4 F (36.9 C)-99.1 F (37.3 C)] 98.4 F (36.9 C) (11/10 0800) Pulse Rate:  [86-117] 116 (11/10 0800) Resp:  [13-24] 24 (11/10 0800) BP: (103-137)/(68-84) 108/79 (11/10 0800) SpO2:  [91 %-94 %] 93 % (11/10 0800) Weight:  [78.8 kg] 78.8 kg (11/10 0400) Last BM Date: 01/31/21  Intake/Output from previous day: 11/09 0701 - 11/10 0700 In: 370 [P.O.:360] Out: 870 [Urine:850; Drains:20] Intake/Output this shift: No intake/output data recorded.  PE: Gen: resting comfortably, NAD Pulm: rate and effort normal Abd: Soft, mild distension, nontender, midline wound clean with healthy granulation tissue, drain with with scant dark bloody fluid   Lab Results:  No results for input(s): WBC, HGB, HCT, PLT in the last 72 hours. BMET No results for input(s): NA, K, CL, CO2, GLUCOSE, BUN, CREATININE, CALCIUM in the last 72 hours. PT/INR No results for input(s): LABPROT, INR in the last 72 hours. CMP     Component Value Date/Time   NA 138 01/27/2021 0354   NA 138 06/20/2020 1034   NA 141 04/20/2014 1019   K 3.8 01/27/2021 0354   K 3.7 04/20/2014 1019   CL 104 01/27/2021 0354   CL 106 04/20/2014 1019   CO2 25 01/27/2021 0354   CO2 30 04/20/2014 1019   GLUCOSE 115 (H) 01/27/2021 0354   GLUCOSE 102 (H) 04/20/2014 1019   BUN 23 01/27/2021 0354   BUN 18 06/20/2020 1034   BUN 14 04/20/2014 1019   CREATININE 1.10 01/27/2021 0354   CREATININE 1.18 04/20/2014 1019   CALCIUM 9.8 01/27/2021 0354   CALCIUM 9.3 04/20/2014 1019   PROT 6.8 01/27/2021 0354   PROT 6.5 06/18/2017 1425   PROT 7.4 04/20/2014 1019   ALBUMIN 1.9 (L) 01/27/2021 0354   ALBUMIN 4.0 06/18/2017 1425   ALBUMIN 3.4 04/20/2014 1019   AST 25 01/27/2021 0354   AST 21 04/20/2014 1019   ALT 24 01/27/2021 0354    ALT 20 04/20/2014 1019   ALKPHOS 141 (H) 01/27/2021 0354   ALKPHOS 97 04/20/2014 1019   BILITOT 1.1 01/27/2021 0354   BILITOT 0.4 06/18/2017 1425   BILITOT 0.6 04/20/2014 1019   GFRNONAA >60 01/27/2021 0354   GFRNONAA >60 04/20/2014 1019   GFRNONAA >60 11/19/2012 1107   GFRAA 53 (L) 01/13/2020 1410   GFRAA >60 04/20/2014 1019   GFRAA >60 11/19/2012 1107   Lipase     Component Value Date/Time   LIPASE 31 01/01/2021 1023   LIPASE 112 04/20/2014 1019       Studies/Results: No results found.  Anti-infectives: Anti-infectives (From admission, onward)    Start     Dose/Rate Route Frequency Ordered Stop   01/19/21 1200  cefTRIAXone (ROCEPHIN) 2 g in sodium chloride 0.9 % 100 mL IVPB        2 g 200 mL/hr over 30 Minutes Intravenous Every 24 hours 01/19/21 1056     01/19/21 1145  metroNIDAZOLE (FLAGYL) tablet 500 mg        500 mg Oral Every 12 hours 01/19/21 1056     01/18/21 0615  vancomycin (VANCOREADY) IVPB 1250 mg/250 mL  Status:  Discontinued        1,250 mg 166.7 mL/hr over 90 Minutes Intravenous Every 24 hours 01/18/21 0517 01/19/21 1400   01/17/21 1330  Ampicillin-Sulbactam (  UNASYN) 3 g in sodium chloride 0.9 % 100 mL IVPB  Status:  Discontinued        3 g 200 mL/hr over 30 Minutes Intravenous Every 8 hours 01/17/21 1239 01/19/21 1056   01/09/21 1100  piperacillin-tazobactam (ZOSYN) IVPB 3.375 g  Status:  Discontinued        3.375 g 12.5 mL/hr over 240 Minutes Intravenous Every 8 hours 01/09/21 1000 01/12/21 0835   01/09/21 0830  ceFEPIme (MAXIPIME) 2 g in sodium chloride 0.9 % 100 mL IVPB  Status:  Discontinued        2 g 200 mL/hr over 30 Minutes Intravenous Every 12 hours 01/09/21 0737 01/09/21 1000   01/08/21 1400  metroNIDAZOLE (FLAGYL) IVPB 500 mg  Status:  Discontinued        500 mg 100 mL/hr over 60 Minutes Intravenous Every 12 hours 01/08/21 1059 01/09/21 1000   01/08/21 1400  ceFEPIme (MAXIPIME) 2 g in sodium chloride 0.9 % 100 mL IVPB  Status:  Discontinued         2 g 200 mL/hr over 30 Minutes Intravenous Every 24 hours 01/08/21 1114 01/09/21 0737   01/08/21 1000  vancomycin (VANCOREADY) IVPB 750 mg/150 mL  Status:  Discontinued        750 mg 150 mL/hr over 60 Minutes Intravenous Every 24 hours 01/07/21 2052 01/09/21 1000   01/08/21 0600  vancomycin (VANCOREADY) IVPB 1500 mg/300 mL  Status:  Discontinued        1,500 mg 150 mL/hr over 120 Minutes Intravenous Every 24 hours 01/07/21 0430 01/07/21 1210   01/08/21 0600  vancomycin (VANCOREADY) IVPB 750 mg/150 mL  Status:  Discontinued        750 mg 150 mL/hr over 60 Minutes Intravenous Every 24 hours 01/07/21 1210 01/07/21 2052   01/07/21 1400  piperacillin-tazobactam (ZOSYN) IVPB 3.375 g  Status:  Discontinued        3.375 g 12.5 mL/hr over 240 Minutes Intravenous Every 8 hours 01/07/21 0430 01/08/21 1114   01/07/21 0530  piperacillin-tazobactam (ZOSYN) IVPB 3.375 g        3.375 g 100 mL/hr over 30 Minutes Intravenous STAT 01/07/21 0430 01/07/21 0622   01/07/21 0530  vancomycin (VANCOREADY) IVPB 1750 mg/350 mL        1,750 mg 175 mL/hr over 120 Minutes Intravenous  Once 01/07/21 0430 01/07/21 0841   01/05/21 1430  Ampicillin-Sulbactam (UNASYN) 3 g in sodium chloride 0.9 % 100 mL IVPB        3 g 200 mL/hr over 30 Minutes Intravenous Every 6 hours 01/05/21 1334 01/06/21 1552   01/01/21 2200  piperacillin-tazobactam (ZOSYN) IVPB 3.375 g        3.375 g 12.5 mL/hr over 240 Minutes Intravenous Every 8 hours 01/01/21 1841 01/04/21 2359   01/01/21 2100  piperacillin-tazobactam (ZOSYN) IVPB 3.375 g  Status:  Discontinued        3.375 g 100 mL/hr over 30 Minutes Intravenous Every 8 hours 01/01/21 1836 01/01/21 1841   01/01/21 1345  piperacillin-tazobactam (ZOSYN) IVPB 3.375 g        3.375 g 100 mL/hr over 30 Minutes Intravenous  Once 01/01/21 1337 01/01/21 1430        Assessment/Plan POD 32, s/p exploratory laparotomy, ileocecectomy, drainage of intraabdominal abscess 01/01/21 Dr. Grandville Silos for  perforated appendicitis with abscess - Drain removed 10/24 - d/c vac 11/4. Daily wet to dry dressing changes to midline abdominal wound - Continue PT/OT, SNF. [place] for discharge from surgical standpoint once dispo  arranged - on a diet and having bowel function but not eating much. Continue bowel regimen. Encourage PO intake   POD 26, s/p ex lap, splenectomy, application of incisional wound vac 10/14. Dr. Bobbye Morton for splenic laceration - CT 10/24 with complex LUQ fluid collection - s/p IR drain 10/25 yielding dark bloody fluid, culture with E coli resistant to unasyn - switched to Rocephin/ flagyl 10/27.  - post-splenectomy vaccines given 10/28 - CT scan 10/31 showed improving LUQ fluid collection with drain in place, no new acute findings  - Continue drain and antibiotics as above for now, planning for 3 weeks oral antibiotics at discharge. Per IR repeat imaging/possible drain injection once output < 10 mL/QD (excluding flush material.) Will touch base with IR today. Drain OP 20 cc for last 24 hrs Awaiting bed at SNF, peer to peer for LTACH denied 11/2 Palliative team is following. "If further decline per family and patient transition to full comfort care."    FEN: TNA stopped 10/30, reg diet VTE: SQH  ID: Zosyn 10/9>10/20, unasyn 10/25>10/27, vancomycin 10/26>10/27 rocephin/flagyl 10/27>> ok to transition to PO abx from a surgical standpoint   Below per primary team: hemorrhagic shock 2/2 above s/p 6 u PRBC, 2 u FFP 10/14 Septic shock 2/2 perforated appendicitis  EtOH abuse Alcohol related cardiomyopathy T2DM Chronic anemia Hx of TIA HTN HLD Gout Hx of GIB CAD Tobacco abuse L pleural effusion s/p thoracentesis 10/25  LOS: 32 days    Norm Parcel, [facility] 02/02/2021, 8:45 AM Please see Amion for pager number during day hours 7:00am-4:30pm

## 2021-02-02 NOTE — Procedures (Signed)
Referring Physician(s): B. Grandville Silos  Supervising Physician: Mir, Sharen Heck  Patient Status:  [facility] - In-pt  Chief Complaint:  F/u Drain  Brief History:  Travis [name] is a 65 year old male who is s/p appendectomy and partial cecectomy complicated by spontaneous splenic rupture necessitating splenectomy.  This was further complicated by intraabdominal fluid collection in the subdiaphragmatic space with an associated left-sided pleural effusion.   He is S/p thoracentesis and LUQ drain placement on 01/17/21 by Dr. Laurence Ferrari.  This is hospital day 32.  Subjective:  Lying in bed. Pleasant. No complaints.  Allergies: Patient has no known allergies.  Medications: Prior to Admission medications   Medication Sig Start Date End Date Taking? Authorizing Provider  albuterol (VENTOLIN HFA) 108 (90 Base) MCG/ACT inhaler Inhale 2 puffs into the lungs every 6 (six) hours as needed for wheezing or shortness of breath.   Yes [provider]  allopurinol (ZYLOPRIM) 100 MG tablet Take 1 tablet (100 mg total) by mouth 2 (two) times daily. 12/19/20  Yes [name], NP  aspirin 325 MG tablet Take 162.5 mg by mouth in the morning.   Yes [provider]  atorvastatin (LIPITOR) 20 MG tablet Take 20 mg by mouth daily.   Yes [provider]  carvedilol (COREG) 6.25 MG tablet Take 1 tablet (6.25 mg total) by mouth 2 (two) times daily. 06/03/20  Yes Theora Gianotti, NP  cetirizine (ZYRTEC) 10 MG tablet Take 1 tablet (10 mg total) by mouth daily. 07/07/20  Yes [name], NP  fenofibrate (TRICOR) 145 MG tablet TAKE 1 TABLET BY MOUTH ONCE A DAY 11/23/20  Yes Dutch Quint B, FNP  ferrous sulfate 325 (65 FE) MG tablet Take 1 tablet (325 mg total) by mouth daily with breakfast. 06/02/20  Yes Baity, Coralie Keens, NP  fluticasone (FLONASE) 50 MCG/ACT nasal spray Place 1-2 sprays into both nostrils daily as needed for allergies or rhinitis.   Yes [provider]   furosemide (LASIX) 40 MG tablet Take 1 tablet by mouth daily.   Yes [provider]  hydroxypropyl methylcellulose / hypromellose (ISOPTO TEARS / GONIOVISC) 2.5 % ophthalmic solution Place 1 drop into both eyes 3 (three) times daily as needed for dry eyes.   Yes [provider]  losartan (COZAAR) 25 MG tablet Take 0.5 tablets (12.5 mg total) by mouth daily. 12/19/20 03/19/21 Yes [name], NP  magnesium oxide (MAG-OX) 400 MG tablet Take 1 tablet (400 mg total) by mouth daily. 12/19/20  Yes [name], NP  multivitamin (ONE-A-DAY MEN'S) TABS tablet Take 1 tablet by mouth daily with breakfast.   Yes [provider]  Omega-3 Fatty Acids (FISH OIL PO) Take 1 capsule by mouth daily.   Yes [provider]  pantoprazole (PROTONIX) 40 MG tablet Take 1 tablet (40 mg total) by mouth daily. 06/02/20  Yes Baity, Coralie Keens, NP  triamcinolone cream (KENALOG) 0.1 % APPLY TO AFFECTED AREAS TWICE A DAY AS NEEDED. AVOID FACE, GROIN, OR UNDERARMS. Patient taking differently: Apply 1 application topically 2 (two) times daily as needed (dry skin, rash). 11/01/20  Yes Dutch Quint B, FNP     Vital Signs: BP 125/85 (BP Location: Right Arm)   Pulse (!) 106   Temp 98.2 F (36.8 C) (Oral)   Resp (!) 23   Ht 6' (1.829 m)   Wt 78.8 kg   SpO2 94%   BMI 23.56 kg/m   Physical Exam Constitutional:  Appearance: Normal appearance.  HENT:     Head: Normocephalic and atraumatic.  Cardiovascular:     Rate and Rhythm: Normal rate.  Pulmonary:     Effort: Pulmonary effort is normal. No respiratory distress.  Abdominal:     Palpations: Abdomen is soft.     Tenderness: There is no abdominal tenderness.  Skin:    General: Skin is warm and dry.  Neurological:     General: No focal deficit present.     Mental Status: He is alert and oriented to person, place, and time.  Psychiatric:        Mood and Affect: Mood normal.        Behavior: Behavior normal.        Thought  Content: Thought content normal.        Judgment: Judgment normal.   Drain Location: LUQ Size: Fr size: 12 Fr Date of placement: 01/17/21 Currently to: Drain collection device: suction bulb 24 hour output:  Output by Drain (mL) 01/31/21 0701 - 01/31/21 1900 01/31/21 1901 - 02/01/21 0700 02/01/21 0701 - 02/01/21 1900 02/01/21 1901 - 02/02/21 0700 02/02/21 0701 - 02/02/21 1133  Closed System Drain Left Abdomen Bulb (JP) 12 Fr. 10 20 10 10      Current examination: Flushes/aspirates easily.  Insertion site unremarkable. Suture and stat lock in place. Dressed appropriately.  Drainage in bulb continues to be cloudy tan but has decreased to 10 mL per day for the past 3 days.  Imaging: No results found.  Labs:  CBC: Recent Labs    01/26/21 0346 01/27/21 0354 01/28/21 0355 01/29/21 0417  WBC 10.5 11.2* 11.1* 13.7*  HGB 8.6* 8.7* 8.8* 8.8*  HCT 28.0* 27.9* 28.8* 28.6*  PLT 582* 608* 592* 583*    COAGS: Recent Labs    01/01/21 1340 01/06/21 0308 01/06/21 0835  INR 1.1 1.3* 1.2  APTT  --   --  26    BMP: Recent Labs    01/24/21 0050 01/25/21 0520 01/26/21 0346 01/27/21 0354  NA 138 135 137 138  K 4.3 3.7 3.8 3.8  CL 103 102 104 104  CO2 25 25 26 25   GLUCOSE 126* 120* 93 115*  BUN 24* 22 22 23   CALCIUM 9.4 9.5 9.6 9.8  CREATININE 1.11 1.18 1.14 1.10  GFRNONAA >60 >60 >60 >60    LIVER FUNCTION TESTS: Recent Labs    01/24/21 0050 01/25/21 0520 01/26/21 0346 01/27/21 0354  BILITOT 0.7 0.5 0.7 1.1  AST 23 22 25 25   ALT 26 23 23 24   ALKPHOS 182* 146* 151* 141*  PROT 6.4* 6.5 6.8 6.8  ALBUMIN 1.7* 1.8* 1.9* 1.9*    Assessment and Plan:  LUQ drain placement with IR on 01/17/21 by Dr. Laurence Ferrari.  Drain output has decreased to 10 mL per day for the past 3 days. Continues to be cloudy.  Discussed with Dr. Dwaine Gale.  Hold off on repeat CT until drainage is more clear.   I have put in orders for patient to follow up with IR clinic 10-14 days post d/c for  repeat imaging/possible drain injection.   IR scheduler will contact patient with date/time of appointment.   Patient will need to flush drain QD with 5 cc NS, record output QD, dressing changes every 2-3 days or earlier if soiled.    Electronically Signed: Murrell Redden, PA-C 02/02/2021, 11:31 AM    I spent a total of 15 Minutes at the the patient's bedside AND on the patient's hospital floor  or unit, greater than 50% of which was counseling/coordinating care for f/u drain.

## 2021-02-02 NOTE — Progress Notes (Signed)
PROGRESS NOTE        PATIENT DETAILS Name: Travis [name] Age: 65 y.o. Sex: male Date of Birth: Aug 13, 1955 Admit Date: 01/01/2021 Admitting Physician Georganna Skeans, MD JQB:HALPF, Alyson Locket, NP  Brief Narrative:  Patient is a 65 y.o. male with history of HFrEF, CKD stage III, COPD, HTN, DM-2, HLD, nonobstructive CAD-who presented with abdominal pain-found to have sepsis from ruptured appendicitis-underwent exploratory laparotomy/ileocecectomy and drainage of intra-abdominal abscess.Patient was briefly managed in the ICU-unfortunately-on 10/14 he developed severe abdominal pain -he was then found to have hemorrhagic shock due to a spontaneous splenic rupture-he was reevaluated by general surgery and underwent a splenectomy.  He was readmitted to the ICU-remained on the ventilator-he self extubated on 10/16-he was monitored closely-upon further stability-transfer to [facility] on 10/20.  Significant events: 10/09>>admit- ex lap, remained intubated post op-to ICU 10/10>>extubated 10/12>> transferred to [facility] 10/14>>hypotensive/abd pain-CT showed spleenc rupture-to OR for splenectomy. 10/16>> Self-extubated  10/18>> Increased O2 requirement to 8L 10/19>> O2 weaned to 4L 10/20>> transferred to [facility]    Radiology/Echo 10/09>> CT abdomen: Acute appendicitis with perforation 10/14>> CT abdomen: Splenic rupture with large splenic hematoma 10/15>> TTE: EF 45-50% 10/20>> CT abdomen/pelvis: Indeterminate 2.4 cm fluid collection in the right lower quadrant adjacent to the cecal resection site.  Moderate left pleural effusion with compressive atelectasis. 10/24 >> CT abdomen/pelvis: complex fluid collections in the left upper quadrant. The largest collection there is 162 mL.  10/27 >> CT abdomen/pelvis: Placement of pigtail catheter in the left upper quadrant fluid collection. 10/31>> CT abdomen/pelvis: Interval decrease size of the dominant fluid collection  in the left upper quadrant.  Subjective:  Lying comfortably in bed-no major issues overnight.   Objective:  Vitals: Blood pressure 125/85, pulse (!) 106, temperature 98.2 F (36.8 C), temperature source Oral, resp. rate (!) 23, height 6' (1.829 m), weight 78.8 kg, SpO2 94 %.   Exam: Gen Exam:Alert awake-not in any distress HEENT:atraumatic, normocephalic Chest: B/L clear to auscultation anteriorly CVS:S1S2 regular Abdomen:soft non tender, non distended Extremities:no edema Neurology: Non focal Skin: no rash    Assessment/Plan:   Sepsis due to acute appendicitis with perforation/intra-abdominal abscesses and Bacteroides bacteremia (s/p laparotomy/ileocecectomy and drainage of intra-abdominal abscess on 10/9): Sepsis physiology has resolved-postoperatively-developed left upper quadrant fluid collection-and is s/p drain placement by IR.  On Rocephin/Flagyl (fluid cultures growing E. coli)-with plans to transition to Vantin/Flagyl on discharge for additional 10-14 days.  Staff epidermidis bacteremia: Likely a contaminant and of no clinical significance.   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  Acute hypoxic respiratory failure due to atelectasis/pleural effusion: Self extubated on 10/16-stable on anywhere from 2-4 L of oxygen-did develop some transient shortness of breath on 10/20-which responded to bronchodilators/diuretics.    Patient appears to have a moderate-sized left-sided pleural effusion which is likely transudative from-HFrEF/reactive to splenectomy.  PRN Lasix, also IR did ultrasound-guided thoracentesis on 01/17/2021 which was consistent with sympathetic exudative effusion gram stain negative cultures negative  Acute metabolic encephalopathy: Rapidly improving-continue supportive care.  AKI on CKD stage IIIa: AKI hemodynamically mediated-hold Lasix, post  hydration and packed RBC transfusion, improved.  HTN: BP stable-continue Coreg-losartan remains on hold  Nonischemic cardiomyopathy/HFrEF: Not in exacerbation-continue Lasix and monitor volume status closely.  Follow electrolytes.   Nonobstructive CAD:  No anginal symptoms-already on beta-blocker-we will resume aspirin/statin over the next few days when more stable.  Hypomagnesemia: repleted  Multifactorial anemia: Due to combination of splenic rupture, acute illness-has been transfused numerous units of PRBC-hemoglobin now stable.  Follow periodically.    HLD: Resume statin/fenofibrate when oral intake was stable.  COPD: Stable-continue bronchodilators  Functional quadriplegia/debility/deconditioning: Due to acute illness-continue PT/OT eval-either SNF or LTAC on discharge.  DM-2 (A1c 6.4 on 06/02/2020): CBGs stable-continue SSI  Recent Labs    02/01/21 1641 02/01/21 2144 02/02/21 0733  GLUCAP 90 127* 100*    Failure to thrive syndrome: Appetite-functional status slowly improving-continue to encourage oral intake-continue nutritional supplements.   Nutrition Status: Nutrition Problem: Moderate Malnutrition Etiology: acute illness, chronic illness (perforated appendix, CAD, cardiomyopathy) Signs/Symptoms: mild muscle depletion, mild fat depletion Interventions: Ensure Enlive (each supplement provides 350kcal and 20 grams of protein), MVI  BMI: Estimated body mass index is 23.56 kg/m as calculated from the following:   Height as of this encounter: 6' (1.829 m).   Weight as of this encounter: 78.8 kg.    Procedures: 10/9>>exploratory laparotomy, ileocecectomy, drainage of intraabdominal abscess  10/14>> exploratory laparotomy and splenectomy 10/14>> left subclavian triple-lumen catheter 01/17/2021  >> left ultrasound-guided thoracentesis 600 cc exudative fluid, gram stain -ve 01/17/2021  >> LUQ JP drain by IR   Consults: General surgery, PCCM DVT Prophylaxis:  Heparin Code Status:DNR Family Communication: discussed with niece by phone 11/5.  Discussed with sister in law at bedside today    Diet: Diet Order             Diet regular Room service appropriate? Yes; Fluid consistency: Thin  Diet effective now                    Disposition Plan: Status is: Inpatient  Insurance has denied LTAC placement, so plan to discharge to subacute rehab  Barriers to Discharge: Waiting safe disposition plan  MEDICATIONS: Scheduled Meds:  (feeding supplement) PROSource Plus  30 mL Oral BID BM   acetaminophen  1,000 mg Oral Q6H   bisacodyl  10 mg Rectal Once   Chlorhexidine Gluconate Cloth  6 each Topical Daily   docusate sodium  100 mg Oral BID   enoxaparin (LOVENOX) injection  40 mg Subcutaneous Q24H   feeding supplement  237 mL Oral BID BM    HYDROmorphone (DILAUDID) injection  0.5 mg Intravenous Once   insulin aspart  0-20 Units Subcutaneous TID WC   mouth rinse  15 mL Mouth Rinse BID   methocarbamol  500 mg Oral QID   metoprolol tartrate  50 mg Oral BID   metroNIDAZOLE  500 mg Oral Q12H   mirtazapine  30 mg Oral QHS   multivitamin with minerals  1 tablet Oral Daily   oxyCODONE  20 mg Oral Q12H   pantoprazole  40 mg Oral Daily   polyethylene glycol  17 g Oral Daily   Continuous Infusions:  sodium chloride 10 mL/hr at 01/29/21 0610   cefTRIAXone (ROCEPHIN)  IV 2 g (02/01/21 1426)   PRN Meds:.sodium chloride, bisacodyl, dextromethorphan, HYDROmorphone (DILAUDID) injection, hydroxypropyl methylcellulose / hypromellose, ipratropium-albuterol, ondansetron (ZOFRAN) IV, oxyCODONE, phenol, simethicone, sodium chloride, sodium chloride flush   I have personally reviewed following labs and imaging studies  LABORATORY DATA:  Recent Labs  Lab 01/27/21 0354 01/28/21 0355 01/29/21 0417  WBC 11.2* 11.1* 13.7*  HGB 8.7* 8.8* 8.8*  HCT 27.9* 28.8* 28.6*  PLT 608* 592* 583*  MCV 92.4 92.6 93.2  MCH  28.8 28.3 28.7  MCHC 31.2 30.6 30.8   RDW 16.5* 16.4* 16.6*     Recent Labs  Lab 01/27/21 0354 01/28/21 0355 01/29/21 0417  NA 138  --   --   K 3.8  --   --   CL 104  --   --   CO2 25  --   --   GLUCOSE 115*  --   --   BUN 23  --   --   CREATININE 1.10  --   --   CALCIUM 9.8  --   --   AST 25  --   --   ALT 24  --   --   ALKPHOS 141*  --   --   BILITOT 1.1  --   --   ALBUMIN 1.9*  --   --   MG 1.5* 1.6* 1.6*  PROCALCITON 0.22 0.21 0.15      RADIOLOGY STUDIES/RESULTS: No results found.   LOS: 32 days   Signature -  Oren Binet M.D on 02/02/2021 at 11:48 AM   -  To page go to [email or web address]

## 2021-02-02 NOTE — TOC Progression Note (Signed)
Transition of Care [facility]) - Progression Note    Patient Details  Name: Travis [name] MRN: [id number] Date of Birth: 07-27-1955  Transition of Care [facility]) CM/SW [place], LCSW Phone Number: 02/02/2021, 4:23 PM  Clinical Narrative:    CSW sent updated therapy notes to [facility] for insurance.    Expected Discharge Plan: Long Term Acute Care (LTAC) Barriers to Discharge: Continued Medical Work up, Orthoptist and Services Expected Discharge Plan: Long Term Acute Care (LTAC) In-house Referral: Clinical Social Work   Post Acute Care Choice: Long Term Acute Care (LTAC) Living arrangements for the past 2 months: Single Family Home                                       Social Determinants of Health (SDOH) Interventions    Readmission Risk Interventions No flowsheet data found.

## 2021-02-03 DIAGNOSIS — K3532 Acute appendicitis with perforation and localized peritonitis, without abscess: Secondary | ICD-10-CM | POA: Diagnosis not present

## 2021-02-03 LAB — GLUCOSE, CAPILLARY
Glucose-Capillary: 95 mg/dL (ref 70–99)
Glucose-Capillary: 94 mg/dL (ref 70–99)
Glucose-Capillary: 116 mg/dL — ABNORMAL HIGH (ref 70–99)
Glucose-Capillary: 83 mg/dL (ref 70–99)

## 2021-02-03 LAB — RESP PANEL BY RT-PCR (FLU A&B, COVID) ARPGX2
Influenza A by PCR: NEGATIVE
Influenza B by PCR: NEGATIVE
SARS Coronavirus 2 by RT PCR: NEGATIVE

## 2021-02-03 NOTE — Discharge Instructions (Addendum)
[facility]      [facility] [phone]  OPEN ABDOMINAL SURGERY: POST OP INSTRUCTIONS  Always review your discharge instruction sheet given to you by the facility where your surgery was performed.  IF YOU HAVE DISABILITY OR FAMILY LEAVE FORMS, YOU MUST BRING THEM TO THE OFFICE FOR PROCESSING.  PLEASE DO NOT GIVE THEM TO YOUR DOCTOR.  A prescription for pain medication may be given to you upon discharge.  Take your pain medication as prescribed, if needed.  If narcotic pain medicine is not needed, then you may take acetaminophen (Tylenol) or ibuprofen (Advil) as needed. Take your usually prescribed medications unless otherwise directed. If you need a refill on your pain medication, please contact your pharmacy. They will contact our office to request authorization.  Prescriptions will not be filled after 5pm or on week-ends. You should follow a light diet the first few days after arrival home, such as soup and crackers, pudding, etc.unless your doctor has advised otherwise. A high-fiber, low fat diet can be resumed as tolerated.   Be sure to include lots of fluids daily. Most patients will experience some swelling and bruising on the chest and neck area.  Ice packs will help.  Swelling and bruising can take several days to resolve Most patients will experience some swelling and bruising in the area of the incision. Ice pack will help. Swelling and bruising can take several days to resolve..  It is common to experience some constipation if taking pain medication after surgery.  Increasing fluid intake and taking a stool softener will usually help or prevent this problem from occurring.  A mild laxative (Milk of Magnesia or Miralax) should be taken according to package directions if there are no bowel movements after 48 hours.  You may have steri-strips (small skin tapes) in place directly over the incision.  These strips should be left on the skin for 7-10 days.  If your surgeon used skin  glue on the incision, you may shower in 24 hours.  The glue will flake off over the next 2-3 weeks.  Any sutures or staples will be removed at the office during your follow-up visit. You may find that a light gauze bandage over your incision may keep your staples from being rubbed or pulled. You may shower and replace the bandage daily. ACTIVITIES:  You may resume regular (light) daily activities beginning the next day--such as daily self-care, walking, climbing stairs--gradually increasing activities as tolerated.  You may have sexual intercourse when it is comfortable.  Refrain from any heavy lifting or straining until approved by your doctor. You may drive when you no longer are taking prescription pain medication, you can comfortably wear a seatbelt, and you can safely maneuver your car and apply brakes Return to Work: ___________________________________ You should see your doctor in the office for a follow-up appointment approximately two weeks after your surgery.  Make sure that you call for this appointment within a day or two after you arrive home to insure a convenient appointment time. OTHER INSTRUCTIONS:  _____________________________________________________________ _____________________________________________________________  WHEN TO CALL YOUR DOCTOR: Fever over 101.0 Inability to urinate Nausea and/or vomiting Extreme swelling or bruising Continued bleeding from incision. Increased pain, redness, or drainage from the incision. Difficulty swallowing or breathing Muscle cramping or spasms. Numbness or tingling in hands or feet or around lips.  The clinic staff is available to answer your questions during regular business hours.  Please don't hesitate to call and ask to speak to one of   the nurses if you have concerns.  For further questions, please visit [email or web address]       Drain care: Flush drain with 5-10cc Saline once a day.  Record daily output of drain.   You may empty bulb 1-2 times per day or as necessary.  Dressing may be changed every 1-2 days or as soiled.  Our outpatient clinic will contact you regarding a follow up appointment to occur in approximately 2 weeks.  You may call [phone] with any questions or concerns.    Information on my medicine - ELIQUIS (apixaban)  This medication education was reviewed with me or my healthcare representative as part of my discharge preparation.   Why was Eliquis prescribed for you? Eliquis was prescribed to treat blood clots that may have been found in the veins of your legs (deep vein thrombosis) or in your lungs (pulmonary embolism) and to reduce the risk of them occurring again.  What do You need to know about Eliquis ? The starting dose is 10 mg (two 5 mg tablets) taken TWICE daily for the FIRST SEVEN (7) DAYS, then on 02/15/2021  the dose is reduced to ONE 5 mg tablet taken TWICE daily.  Eliquis may be taken with or without food.   Try to take the dose about the same time in the morning and in the evening. If you have difficulty swallowing the tablet whole please discuss with your pharmacist how to take the medication safely.  Take Eliquis exactly as prescribed and DO NOT stop taking Eliquis without talking to the doctor who prescribed the medication.  Stopping may increase your risk of developing a new blood clot.  Refill your prescription before you run out.  After discharge, you should have regular check-up appointments with your healthcare provider that is prescribing your Eliquis.    What do you do if you miss a dose? If a dose of ELIQUIS is not taken at the scheduled time, take it as soon as possible on the same day and twice-daily administration should be resumed. The dose should not be doubled to make up for a missed dose.  Important Safety Information A possible side effect of Eliquis is bleeding. You should call your healthcare provider right away if you experience any of the  following: Bleeding from an injury or your nose that does not stop. Unusual colored urine (red or dark brown) or unusual colored stools (red or black). Unusual bruising for unknown reasons. A serious fall or if you hit your head (even if there is no bleeding).  Some medicines may interact with Eliquis and might increase your risk of bleeding or clotting while on Eliquis. To help avoid this, consult your healthcare provider or pharmacist prior to using any new prescription or non-prescription medications, including herbals, vitamins, non-steroidal anti-inflammatory drugs (NSAIDs) and supplements.  This website has more information on Eliquis (apixaban): http://www.eliquis.com/eliquis/home  *To help prevent nose bleeds while on Eliquis, please use saline nasal spray which is available over-the-counter at any Pharmacy or [employer] and use as directed on the bottle*

## 2021-02-03 NOTE — Progress Notes (Signed)
PROGRESS NOTE        PATIENT DETAILS Name: Travis [name] Age: 65 y.o. Sex: male Date of Birth: Jan 31, 1956 Admit Date: 01/01/2021 Admitting Physician Travis Skeans, MD KDX:IPJAS, Travis Locket, NP  Brief Narrative:  Patient is a 65 y.o. male with history of HFrEF, CKD stage III, COPD, HTN, DM-2, HLD, nonobstructive CAD-who presented with abdominal pain-found to have sepsis from ruptured appendicitis-underwent exploratory laparotomy/ileocecectomy and drainage of intra-abdominal abscess.Patient was briefly managed in the ICU-unfortunately-on 10/14 he developed severe abdominal pain -he was then found to have hemorrhagic shock due to a spontaneous splenic rupture-he was reevaluated by general surgery and underwent a splenectomy.  He was readmitted to the ICU-remained on the ventilator-he self extubated on 10/16-he was monitored closely-upon further stability-transfer to the [facility] on 10/20.  Significant events: 10/09>>admit- ex lap, remained intubated post op-to ICU 10/10>>extubated 10/12>> transferred to [facility] 10/14>>hypotensive/abd pain-CT showed spleenc rupture-to OR for splenectomy. 10/16>> Self-extubated  10/18>> Increased O2 requirement to 8L 10/19>> O2 weaned to 4L 10/20>> transferred to [facility]    Radiology/Echo 10/09>> CT abdomen: Acute appendicitis with perforation 10/14>> CT abdomen: Splenic rupture with large splenic hematoma 10/15>> TTE: EF 45-50% 10/20>> CT abdomen/pelvis: Indeterminate 2.4 cm fluid collection in the right lower quadrant adjacent to the cecal resection site.  Moderate left pleural effusion with compressive atelectasis. 10/24 >> CT abdomen/pelvis: complex fluid collections in the left upper quadrant. The largest collection there is 162 mL.  10/27 >> CT abdomen/pelvis: Placement of pigtail catheter in the left upper quadrant fluid collection. 10/31>> CT abdomen/pelvis: Interval decrease size of the dominant fluid collection  in the left upper quadrant.  Subjective:  Was sitting at bedside-looks a whole lot better-no complaints.   Objective:  Vitals: Blood pressure 114/75, pulse 88, temperature 98.4 F (36.9 C), temperature source Oral, resp. rate 18, height 6' (1.829 m), weight 78.8 kg, SpO2 94 %.   Exam: Gen Exam:Alert awake-not in any distress HEENT:atraumatic, normocephalic Chest: B/L clear to auscultation anteriorly CVS:S1S2 regular Abdomen:soft non tender, non distended Extremities:no edema Neurology: Non focal Skin: no rash    Assessment/Plan:   Sepsis due to acute appendicitis with perforation/intra-abdominal abscesses and Bacteroides bacteremia (s/p laparotomy/ileocecectomy and drainage of intra-abdominal abscess on 10/9): Sepsis physiology has resolved-postoperatively-developed left upper quadrant fluid collection-and is s/p drain placement by IR.  On Rocephin/Flagyl (fluid cultures growing E. coli)-with plans to transition to Vantin/Flagyl on discharge for additional 10-14 days.  Staff epidermidis bacteremia: Likely a contaminant and of no clinical significance.   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  Acute hypoxic respiratory failure due to atelectasis/pleural effusion: Self extubated on 10/16-stable on anywhere from 2-4 L of oxygen-did develop some transient shortness of breath on 10/20-which responded to bronchodilators/diuretics.    Patient appears to have a moderate-sized left-sided pleural effusion which is likely transudative from-HFrEF/reactive to splenectomy.  PRN Lasix, also IR did ultrasound-guided thoracentesis on 01/17/2021 which was consistent with sympathetic exudative effusion gram stain negative cultures negative  Acute metabolic encephalopathy: Rapidly improving-continue supportive care.  AKI on CKD stage IIIa: AKI hemodynamically mediated-hold Lasix, post  hydration and packed RBC transfusion, improved.  HTN: BP stable-continue Coreg-losartan remains on hold  Nonischemic cardiomyopathy/HFrEF: Not in exacerbation-continue Lasix and monitor volume status closely.  Follow electrolytes.   Nonobstructive CAD:  No anginal symptoms-already on beta-blocker-we will resume aspirin/statin over the next few days when more stable.  Hypomagnesemia: repleted  Multifactorial anemia: Due to combination of splenic rupture, acute illness-has been transfused numerous units of PRBC-hemoglobin now stable.  Follow periodically.    HLD: Resume statin/fenofibrate when oral intake was stable.  COPD: Stable-continue bronchodilators  Functional quadriplegia/debility/deconditioning: Due to acute illness-continue PT/OT eval-either SNF or LTAC on discharge.  DM-2 (A1c 6.4 on 06/02/2020): CBGs stable-continue SSI  Recent Labs    02/02/21 1636 02/02/21 2032 02/03/21 0747  GLUCAP 96 148* 95    Failure to thrive syndrome: Appetite-functional status slowly improving-continue to encourage oral intake-continue nutritional supplements.   Nutrition Status: Nutrition Problem: Moderate Malnutrition Etiology: acute illness, chronic illness (perforated appendix, CAD, cardiomyopathy) Signs/Symptoms: mild muscle depletion, mild fat depletion Interventions: Ensure Enlive (each supplement provides 350kcal and 20 grams of protein), MVI  BMI: Estimated body mass index is 23.56 kg/m as calculated from the following:   Height as of this encounter: 6' (1.829 m).   Weight as of this encounter: 78.8 kg.    Procedures: 10/9>>exploratory laparotomy, ileocecectomy, drainage of intraabdominal abscess  10/14>> exploratory laparotomy and splenectomy 10/14>> left subclavian triple-lumen catheter 01/17/2021  >> left ultrasound-guided thoracentesis 600 cc exudative fluid, gram stain -ve 01/17/2021  >> LUQ JP drain by IR   Consults: General surgery, PCCM DVT Prophylaxis:  Heparin Code Status:DNR Family Communication: discussed with niece by phone 11/5.  Discussed with sister in law at bedside today    Diet: Diet Order             Diet regular Room service appropriate? Yes; Fluid consistency: Thin  Diet effective now                    Disposition Plan: Status is: Inpatient  Insurance has denied LTAC placement, so plan to discharge to subacute rehab  Barriers to Discharge: Waiting safe disposition plan  MEDICATIONS: Scheduled Meds:  (feeding supplement) PROSource Plus  30 mL Oral BID BM   acetaminophen  1,000 mg Oral Q6H   bisacodyl  10 mg Rectal Once   Chlorhexidine Gluconate Cloth  6 each Topical Daily   docusate sodium  100 mg Oral BID   enoxaparin (LOVENOX) injection  40 mg Subcutaneous Q24H   feeding supplement  237 mL Oral BID BM    HYDROmorphone (DILAUDID) injection  0.5 mg Intravenous Once   insulin aspart  0-20 Units Subcutaneous TID WC   mouth rinse  15 mL Mouth Rinse BID   methocarbamol  500 mg Oral QID   metoprolol tartrate  50 mg Oral BID   metroNIDAZOLE  500 mg Oral Q12H   mirtazapine  30 mg Oral QHS   multivitamin with minerals  1 tablet Oral Daily   oxyCODONE  20 mg Oral Q12H   pantoprazole  40 mg Oral Daily   polyethylene glycol  17 g Oral Daily   Continuous Infusions:  sodium chloride 10 mL/hr at 01/29/21 0610   cefTRIAXone (ROCEPHIN)  IV 2 g (02/02/21 1244)   PRN Meds:.sodium chloride, bisacodyl, dextromethorphan, HYDROmorphone (DILAUDID) injection, hydroxypropyl methylcellulose / hypromellose, ipratropium-albuterol, ondansetron (ZOFRAN) IV, oxyCODONE, phenol, simethicone, sodium chloride, sodium chloride flush   I have personally reviewed following labs and imaging studies  LABORATORY DATA:  Recent Labs  Lab 01/28/21 0355 01/29/21 0417  WBC 11.1* 13.7*  HGB 8.8* 8.8*  HCT 28.8* 28.6*  PLT 592* 583*  MCV 92.6 93.2  MCH 28.3 28.7  MCHC 30.6 30.8  RDW 16.4* 16.6*     Recent Labs  Lab  01/28/21 0355 01/29/21 0417  MG 1.6* 1.6*  PROCALCITON 0.21 0.15      RADIOLOGY STUDIES/RESULTS: No results found.   LOS: 33 days   Signature -  Oren Binet M.D on 02/03/2021 at 11:30 AM   -  To page go to [email or web address]

## 2021-02-03 NOTE — TOC Progression Note (Signed)
Transition of Care [facility]) - Progression Note    Patient Details  Name: Travis [name] MRN: [id number] Date of Birth: [birth year]  Transition of Care [facility]) CM/SW [place], [place] Phone Number: 02/03/2021, 3:48 PM  Clinical Narrative:    Fortunato Curling still awaiting insurance approval. Patient's niece was updated.    Expected Discharge Plan: Long Term Acute Care (LTAC) Barriers to Discharge: Continued Medical Work up, Orthoptist and Services Expected Discharge Plan: Long Term Acute Care (LTAC) In-house Referral: Clinical Social Work   Post Acute Care Choice: Long Term Acute Care (LTAC) Living arrangements for the past 2 months: Single Family Home                                       Social Determinants of Health (SDOH) Interventions    Readmission Risk Interventions No flowsheet data found.

## 2021-02-03 NOTE — Progress Notes (Signed)
Occupational Therapy Treatment Patient Details Name: Travis [name] MRN: [id number] DOB: 02/08/1956 Today's Date: 02/03/2021   History of present illness Pt is a 65 y/o male admitted 10/9 with complaints of R lower quadrant abdominal pain. Found with intra-abdominal sepsis with perforated appendix and abdominal abscess; s/p diagnostic laparoscopy, ileocecectomy, drainage of interaabdominal abscess 10/9. Post op ileus. 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 Pt self extubated 10/16. L thoracentesis 10/25 with 600cc drained. PMHx: CHF, CKD, COPD, TIA, diabetes, HTN, gout, CAD.   OT comments  Pt engaged in self care session, requires up to mod assist cueing for self care sequencing of 3 step task for recall and completion.  Able to initially recall steps with independence, but with increased time pt is forgetful.  He demonstrates ability to complete mobility, transfers and self care with min guard assist.  Pt eager for rehab, voicing "whatever it takes".  Will follow.    Recommendations for follow up therapy are one component of a multi-disciplinary discharge planning process, led by the attending physician.  Recommendations may be updated based on patient status, additional functional criteria and insurance authorization.    Follow Up Recommendations  Skilled nursing-short term rehab (<3 hours/day)    Assistance Recommended at Discharge Frequent or constant Supervision/Assistance  Equipment Recommendations  BSC/3in1    Recommendations for Other Services      Precautions / Restrictions Precautions Precautions: Fall Precaution Comments: LUQ drain Restrictions Weight Bearing Restrictions: No       Mobility Bed Mobility Overal bed mobility: Needs Assistance Bed Mobility: Supine to Sit;Sit to Supine     Supine to sit: Supervision Sit to supine: Supervision   General bed mobility comments: no rail; incr time due to moving slowly    Transfers Overall transfer level:  Needs assistance Equipment used: Rolling walker (2 wheels) Transfers: Sit to/from Stand Sit to Stand: Min guard           General transfer comment: cueing for sequencing, hand placement     Balance Overall balance assessment: Needs assistance Sitting-balance support: Feet supported;No upper extremity supported Sitting balance-Leahy Scale: Good     Standing balance support: Bilateral upper extremity supported;No upper extremity supported;During functional activity Standing balance-Leahy Scale: Poor Standing balance comment: reliant on external support                           ADL either performed or assessed with clinical judgement   ADL Overall ADL's : Needs assistance/impaired     Grooming: Min guard;Wash/dry hands;Oral care;Standing               Lower Body Dressing: Min guard;Sit to/from stand   Toilet Transfer: Min guard;Ambulation;Regular Toilet;BSC/3in1;Grab bars Toilet Transfer Details (indicate cue type and reason): using RW into bathroom with 3:1 over toilet         Functional mobility during ADLs: Min guard;Rolling walker (2 wheels)      Extremity/Trunk Assessment              Vision       Perception     Praxis      Cognition Arousal/Alertness: Awake/alert Behavior During Therapy: Flat affect Overall Cognitive Status: No family/caregiver present to determine baseline cognitive functioning Area of Impairment: Attention;Following commands;Problem solving;Awareness;Memory;Safety/judgement                   Current Attention Level: Selective Memory: Decreased short-term memory Following Commands: Follows one step commands consistently;Follows one step commands  with increased time;Follows multi-step commands inconsistently Safety/Judgement: Decreased awareness of safety;Decreased awareness of deficits Awareness: Emergent Problem Solving: Difficulty sequencing;Requires verbal cues;Slow processing General Comments: pt  provided with 3 step ADL task to complete, initally able to recall all 3 tasks; but requires mod verbal cueing to complete correctly when engaging in tasks.          Exercises     Shoulder Instructions       General Comments VSS, on RA    Pertinent Vitals/ Pain       Pain Assessment: Faces Faces Pain Scale: Hurts a little bit Pain Location: back Pain Descriptors / Indicators: Discomfort Pain Intervention(s): Limited activity within patient's tolerance;Monitored during session;Repositioned  Home Living                                          Prior Functioning/Environment              Frequency  Min 2X/week        Progress Toward Goals  OT Goals(current goals can now be found in the care plan section)  Progress towards OT goals: Progressing toward goals     Plan Discharge plan remains appropriate;Frequency remains appropriate    Co-evaluation                 AM-PAC OT "6 Clicks" Daily Activity     Outcome Measure   Help from another person eating meals?: A Little Help from another person taking care of personal grooming?: A Little Help from another person toileting, which includes using toliet, bedpan, or urinal?: A Little Help from another person bathing (including washing, rinsing, drying)?: A Little Help from another person to put on and taking off regular upper body clothing?: A Little Help from another person to put on and taking off regular lower body clothing?: A Little 6 Click Score: 18    End of Session Equipment Utilized During Treatment: Rolling walker (2 wheels)  OT Visit Diagnosis: Other abnormalities of gait and mobility (R26.89);Muscle weakness (generalized) (M62.81);Pain;Other symptoms and signs involving cognitive function Pain - part of body:  (back)   Activity Tolerance Patient tolerated treatment well   Patient Left in bed;with call bell/phone within reach;with bed alarm set   Nurse Communication Mobility  status        Time: 5852-7782 OT Time Calculation (min): 13 min  Charges: OT General Charges $OT Visit: 1 Visit OT Treatments $Self Care/Home Management : 8-22 mins  Jolaine Artist, OT [place] Pager [phone] Office [place] 02/03/2021, 2:00 PM

## 2021-02-03 NOTE — Progress Notes (Addendum)
Referring Physician(s): * No referring provider recorded for this case *  Supervising Physician: Corrie Mckusick  Patient Status:  [facility] - In-pt  Chief Complaint:  LUQ drain  Subjective:  Travis [name] is a 65 year old male who is s/p appendectomy and partial cecectomy complicated by spontaneous splenic rupture necessitating splenectomy.   This was further complicated by intraabdominal fluid collection in the subdiaphragmatic space with an associated left-sided pleural effusion.    He is S/p thoracentesis and LUQ drain placement on 01/17/21 by Dr. Laurence Ferrari.  No complaints at this time.    Allergies: Patient has no known allergies.  Medications: Prior to Admission medications   Medication Sig Start Date End Date Taking? Authorizing Provider  albuterol (VENTOLIN HFA) 108 (90 Base) MCG/ACT inhaler Inhale 2 puffs into the lungs every 6 (six) hours as needed for wheezing or shortness of breath.   Yes [provider]  allopurinol (ZYLOPRIM) 100 MG tablet Take 1 tablet (100 mg total) by mouth 2 (two) times daily. 12/19/20  Yes [name], NP  aspirin 325 MG tablet Take 162.5 mg by mouth in the morning.   Yes [provider]  atorvastatin (LIPITOR) 20 MG tablet Take 20 mg by mouth daily.   Yes [provider]  carvedilol (COREG) 6.25 MG tablet Take 1 tablet (6.25 mg total) by mouth 2 (two) times daily. 06/03/20  Yes Theora Gianotti, NP  cetirizine (ZYRTEC) 10 MG tablet Take 1 tablet (10 mg total) by mouth daily. 07/07/20  Yes [name], NP  fenofibrate (TRICOR) 145 MG tablet TAKE 1 TABLET BY MOUTH ONCE A DAY 11/23/20  Yes Dutch Quint B, FNP  ferrous sulfate 325 (65 FE) MG tablet Take 1 tablet (325 mg total) by mouth daily with breakfast. 06/02/20  Yes Baity, Coralie Keens, NP  fluticasone (FLONASE) 50 MCG/ACT nasal spray Place 1-2 sprays into both nostrils daily as needed for allergies or rhinitis.   Yes [provider]  furosemide  (LASIX) 40 MG tablet Take 1 tablet by mouth daily.   Yes [provider]  hydroxypropyl methylcellulose / hypromellose (ISOPTO TEARS / GONIOVISC) 2.5 % ophthalmic solution Place 1 drop into both eyes 3 (three) times daily as needed for dry eyes.   Yes [provider]  losartan (COZAAR) 25 MG tablet Take 0.5 tablets (12.5 mg total) by mouth daily. 12/19/20 03/19/21 Yes [name], NP  magnesium oxide (MAG-OX) 400 MG tablet Take 1 tablet (400 mg total) by mouth daily. 12/19/20  Yes [name], NP  multivitamin (ONE-A-DAY MEN'S) TABS tablet Take 1 tablet by mouth daily with breakfast.   Yes [provider]  Omega-3 Fatty Acids (FISH OIL PO) Take 1 capsule by mouth daily.   Yes [provider]  pantoprazole (PROTONIX) 40 MG tablet Take 1 tablet (40 mg total) by mouth daily. 06/02/20  Yes Baity, Coralie Keens, NP  triamcinolone cream (KENALOG) 0.1 % APPLY TO AFFECTED AREAS TWICE A DAY AS NEEDED. AVOID FACE, GROIN, OR UNDERARMS. Patient taking differently: Apply 1 application topically 2 (two) times daily as needed (dry skin, rash). 11/01/20  Yes Dutch Quint B, FNP     Vital Signs: BP 114/75 (BP Location: Right Arm)   Pulse 88   Temp 98.4 F (36.9 C) (Oral)   Resp 18   Ht 6' (1.829 m)   Wt 173 lb 11.6 oz (78.8 kg)   SpO2 94%   BMI 23.56 kg/m   Physical Exam Constitutional:  General: He is not in acute distress. Pulmonary:     Effort: Pulmonary effort is normal.  Abdominal:     General: Abdomen is flat.  Skin:    General: Skin is warm and dry.  Neurological:     General: No focal deficit present.     Mental Status: He is alert.  Psychiatric:        Mood and Affect: Mood normal.        Behavior: Behavior normal.   Drain: 24 hour output:  Output by Drain (mL) 02/01/21 0700 - 02/01/21 1459 02/01/21 1500 - 02/01/21 2259 02/01/21 2300 - 02/02/21 0659 02/02/21 0700 - 02/02/21 1459 02/02/21 1500 - 02/02/21 2259 02/02/21 2300 - 02/03/21 0659 02/03/21  0700 - 02/03/21 1031  Closed System Drain Left Abdomen Bulb (JP) 12 Fr.  10 10  15 5      Current examination: Flushes/aspirates easily.  Insertion site unremarkable. Suture and stat lock in place. Dressed appropriately.  OP is brown and cloudy   Imaging: No results found.  Labs:  CBC: Recent Labs    01/26/21 0346 01/27/21 0354 01/28/21 0355 01/29/21 0417  WBC 10.5 11.2* 11.1* 13.7*  HGB 8.6* 8.7* 8.8* 8.8*  HCT 28.0* 27.9* 28.8* 28.6*  PLT 582* 608* 592* 583*    COAGS: Recent Labs    01/01/21 1340 01/06/21 0308 01/06/21 0835  INR 1.1 1.3* 1.2  APTT  --   --  26    BMP: Recent Labs    01/24/21 0050 01/25/21 0520 01/26/21 0346 01/27/21 0354  NA 138 135 137 138  K 4.3 3.7 3.8 3.8  CL 103 102 104 104  CO2 25 25 26 25   GLUCOSE 126* 120* 93 115*  BUN 24* 22 22 23   CALCIUM 9.4 9.5 9.6 9.8  CREATININE 1.11 1.18 1.14 1.10  GFRNONAA >60 >60 >60 >60    LIVER FUNCTION TESTS: Recent Labs    01/24/21 0050 01/25/21 0520 01/26/21 0346 01/27/21 0354  BILITOT 0.7 0.5 0.7 1.1  AST 23 22 25 25   ALT 26 23 23 24   ALKPHOS 182* 146* 151* 141*  PROT 6.4* 6.5 6.8 6.8  ALBUMIN 1.7* 1.8* 1.9* 1.9*    Assessment and Plan:  LUQ Drain Continue TID flushes with 5 cc NS. Record output Q shift. Dressing changes QD or PRN if soiled.  Call IR APP or on call IR MD if difficulty flushing or sudden change in drain output.  Repeat imaging/possible drain injection once output < 10 mL/QD (excluding flush material.)  Discharge planning: Please contact IR APP or on call IR MD prior to patient d/c to ensure appropriate follow up plans are in place. Typically patient will follow up with IR clinic 10-14 days post d/c for repeat imaging/possible drain injection. IR scheduler will contact patient with date/time of appointment. Patient will need to flush drain QD with 5 cc NS, record output QD, dressing changes every 2-3 days or earlier if soiled.   IR will continue to follow -  please call with questions or concerns.  Electronically Signed: Pasty Spillers, PA 02/03/2021, 10:26 AM   I spent a total of 15 Minutes at the the patient's bedside AND on the patient's hospital floor or unit, greater than 50% of which was counseling/coordinating care for LUQ drain

## 2021-02-04 DIAGNOSIS — K3532 Acute appendicitis with perforation and localized peritonitis, without abscess: Secondary | ICD-10-CM | POA: Diagnosis not present

## 2021-02-04 LAB — GLUCOSE, CAPILLARY
Glucose-Capillary: 94 mg/dL (ref 70–99)
Glucose-Capillary: 96 mg/dL (ref 70–99)
Glucose-Capillary: 140 mg/dL — ABNORMAL HIGH (ref 70–99)
Glucose-Capillary: 93 mg/dL (ref 70–99)

## 2021-02-04 NOTE — Progress Notes (Signed)
PROGRESS NOTE        PATIENT DETAILS Name: Travis [name] Age: 65 y.o. Sex: male Date of Birth: 03-29-1955 Admit Date: 01/01/2021 Admitting Physician Georganna Skeans, MD EGB:TDVVO, Alyson Locket, NP  Brief Narrative:  Patient is a 65 y.o. male with history of HFrEF, CKD stage III, COPD, HTN, DM-2, HLD, nonobstructive CAD-who presented with abdominal pain-found to have sepsis from ruptured appendicitis-underwent exploratory laparotomy/ileocecectomy and drainage of intra-abdominal abscess.Patient was briefly managed in the ICU-unfortunately-on 10/14 he developed severe abdominal pain -he was then found to have hemorrhagic shock due to a spontaneous splenic rupture-he was reevaluated by general surgery and underwent a splenectomy.  He was readmitted to the ICU-remained on the ventilator-he self extubated on 10/16-he was monitored closely-upon further stability-transfer to the [facility] hospitalist service on 10/20.  Significant events: 10/09>>admit- ex lap, remained intubated post op-to ICU 10/10>>extubated 10/12>> transferred to [facility] 10/14>>hypotensive/abd pain-CT showed spleenc rupture-to OR for splenectomy. 10/16>> Self-extubated  10/18>> Increased O2 requirement to 8L 10/19>> O2 weaned to 4L 10/20>> transferred to [facility]    Radiology/Echo 10/09>> CT abdomen: Acute appendicitis with perforation 10/14>> CT abdomen: Splenic rupture with large splenic hematoma 10/15>> TTE: EF 45-50% 10/20>> CT abdomen/pelvis: Indeterminate 2.4 cm fluid collection in the right lower quadrant adjacent to the cecal resection site.  Moderate left pleural effusion with compressive atelectasis. 10/24 >> CT abdomen/pelvis: complex fluid collections in the left upper quadrant. The largest collection there is 162 mL.  10/27 >> CT abdomen/pelvis: Placement of pigtail catheter in the left upper quadrant fluid collection. 10/31>> CT abdomen/pelvis: Interval decrease size of the dominant fluid collection  in the left upper quadrant.  Subjective:  Anxious to leave the hospital-sitting up in bed-denies any chest pain or shortness of breath.   Objective:  Vitals: Blood pressure 119/76, pulse 96, temperature 98.2 F (36.8 C), temperature source Oral, resp. rate 19, height 6' (1.829 m), weight 82 kg, SpO2 93 %.   Exam: Gen Exam:Alert awake-not in any distress HEENT:atraumatic, normocephalic Chest: B/L clear to auscultation anteriorly CVS:S1S2 regular Abdomen:soft non tender, non distended Extremities:no edema Neurology: Non focal Skin: no rash    Assessment/Plan:   Sepsis due to acute appendicitis with perforation/intra-abdominal abscesses and Bacteroides bacteremia (s/p laparotomy/ileocecectomy and drainage of intra-abdominal abscess on 10/9): Sepsis physiology has resolved-postoperatively-developed left upper quadrant fluid collection-and is s/p drain placement by IR.  On Rocephin/Flagyl (fluid cultures growing E. coli)-with plans to transition to Vantin/Flagyl on discharge for additional 10-14 days.  Staff epidermidis bacteremia: Likely a contaminant and of no clinical significance.   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  Acute hypoxic respiratory failure due to atelectasis/pleural effusion: Self extubated on 10/16-stable on anywhere from 2-4 L of oxygen-did develop some transient shortness of breath on 10/20-which responded to bronchodilators/diuretics.    Patient appears to have a moderate-sized left-sided pleural effusion which is likely transudative from-HFrEF/reactive to splenectomy.  PRN Lasix, also IR did ultrasound-guided thoracentesis on 01/17/2021 which was consistent with sympathetic exudative effusion gram stain negative cultures negative  Acute metabolic encephalopathy: Rapidly improving-continue supportive care.  AKI on CKD stage IIIa: AKI  hemodynamically mediated-hold Lasix, post hydration and packed RBC transfusion, improved.  HTN: BP stable-continue Coreg-losartan remains on hold  Nonischemic cardiomyopathy/HFrEF: Not in exacerbation-continue Lasix and monitor volume status closely.  Follow electrolytes.   Nonobstructive CAD: No anginal symptoms-already on beta-blocker-we will resume aspirin/statin over the next few days when more stable.  Hypomagnesemia: repleted  Multifactorial anemia: Due to combination of splenic rupture, acute illness-has been transfused numerous units of PRBC-hemoglobin now stable.  Follow periodically.    HLD: Resume statin/fenofibrate when oral intake was stable.  COPD: Stable-continue bronchodilators  Functional quadriplegia/debility/deconditioning: Due to acute illness-continue PT/OT eval-either SNF or LTAC on discharge.  DM-2 (A1c 6.4 on 06/02/2020): CBGs stable-continue SSI  Recent Labs    02/03/21 1621 02/03/21 2052 02/04/21 0726  GLUCAP 116* 83 94    Failure to thrive syndrome: Appetite-functional status slowly improving-continue to encourage oral intake-continue nutritional supplements.   Nutrition Status: Nutrition Problem: Moderate Malnutrition Etiology: acute illness, chronic illness (perforated appendix, CAD, cardiomyopathy) Signs/Symptoms: mild muscle depletion, mild fat depletion Interventions: Ensure Enlive (each supplement provides 350kcal and 20 grams of protein), MVI  BMI: Estimated body mass index is 24.52 kg/m as calculated from the following:   Height as of this encounter: 6' (1.829 m).   Weight as of this encounter: 82 kg.    Procedures: 10/9>>exploratory laparotomy, ileocecectomy, drainage of intraabdominal abscess  10/14>> exploratory laparotomy and splenectomy 10/14>> left subclavian triple-lumen catheter 01/17/2021  >> left ultrasound-guided thoracentesis 600 cc exudative fluid, gram stain -ve 01/17/2021  >> LUQ JP drain by IR   Consults: General  surgery, PCCM DVT Prophylaxis: Heparin Code Status:DNR Family Communication: discussed with niece by phone 11/5.  Discussed with sister in law at bedside today    Diet: Diet Order             Diet regular Room service appropriate? Yes; Fluid consistency: Thin  Diet effective now                    Disposition Plan: Status is: Inpatient  Insurance has denied LTAC placement, so plan to discharge to subacute rehab  Barriers to Discharge: Waiting safe disposition plan  MEDICATIONS: Scheduled Meds:  (feeding supplement) PROSource Plus  30 mL Oral BID BM   acetaminophen  1,000 mg Oral Q6H   bisacodyl  10 mg Rectal Once   Chlorhexidine Gluconate Cloth  6 each Topical Daily   docusate sodium  100 mg Oral BID   enoxaparin (LOVENOX) injection  40 mg Subcutaneous Q24H   feeding supplement  237 mL Oral BID BM    HYDROmorphone (DILAUDID) injection  0.5 mg Intravenous Once   insulin aspart  0-20 Units Subcutaneous TID WC   mouth rinse  15 mL Mouth Rinse BID   methocarbamol  500 mg Oral QID   metoprolol tartrate  50 mg Oral BID   metroNIDAZOLE  500 mg Oral Q12H   mirtazapine  30 mg Oral QHS   multivitamin with minerals  1 tablet Oral Daily   oxyCODONE  20 mg Oral Q12H   pantoprazole  40 mg Oral Daily   polyethylene glycol  17 g Oral Daily   Continuous Infusions:  sodium chloride 10 mL/hr at 01/29/21 0610   cefTRIAXone (ROCEPHIN)  IV 2 g (02/03/21 1223)   PRN Meds:.sodium chloride, bisacodyl, dextromethorphan, HYDROmorphone (DILAUDID) injection, hydroxypropyl methylcellulose / hypromellose, ipratropium-albuterol, ondansetron (ZOFRAN) IV, oxyCODONE, phenol, simethicone, sodium chloride, sodium chloride flush   I have personally reviewed following labs and imaging studies  LABORATORY DATA:  Recent Labs  Lab 01/29/21 0417  WBC 13.7*  HGB 8.8*  HCT 28.6*  PLT 583*  MCV 93.2  MCH 28.7  MCHC 30.8  RDW 16.6*  Recent Labs  Lab 01/29/21 0417  MG 1.6*   PROCALCITON 0.15      RADIOLOGY STUDIES/RESULTS: No results found.   LOS: 34 days   Signature -  Oren Binet M.D on 02/04/2021 at 10:56 AM   -  To page go to [email or web address]

## 2021-02-04 NOTE — TOC Progression Note (Signed)
Transition of Care [facility]) - Progression Note    Patient Details  Name: Travis [name] MRN: [id number] Date of Birth: 11/05/1955  Transition of Care [facility]) CM/SW [place], [place] Phone Number: 02/04/2021, 10:20 AM  Clinical Narrative:    CSW spoke with Jackelyn Poling at [employer]  Pt has not received insurance auth for [employer]  TOC will continue to assist with disposition planning.   Expected Discharge Plan: Long Term Acute Care (LTAC) Barriers to Discharge: Continued Medical Work up, Orthoptist and Services Expected Discharge Plan: Long Term Acute Care (LTAC) In-house Referral: Clinical Social Work   Post Acute Care Choice: Long Term Acute Care (LTAC) Living arrangements for the past 2 months: Single Family Home                                       Social Determinants of Health (SDOH) Interventions    Readmission Risk Interventions No flowsheet data found.

## 2021-02-05 DIAGNOSIS — K3532 Acute appendicitis with perforation and localized peritonitis, without abscess: Secondary | ICD-10-CM | POA: Diagnosis not present

## 2021-02-05 LAB — GLUCOSE, CAPILLARY
Glucose-Capillary: 89 mg/dL (ref 70–99)
Glucose-Capillary: 105 mg/dL — ABNORMAL HIGH (ref 70–99)
Glucose-Capillary: 128 mg/dL — ABNORMAL HIGH (ref 70–99)
Glucose-Capillary: 65 mg/dL — ABNORMAL LOW (ref 70–99)
Glucose-Capillary: 108 mg/dL — ABNORMAL HIGH (ref 70–99)

## 2021-02-05 NOTE — Progress Notes (Signed)
Hypoglycemic Event  CBG: 65  Treatment: 8 oz juice/soda  Symptoms: None  Follow-up CBG: Time:1700 CBG Result:128  Possible Reasons for Event: Inadequate meal intake  Comments/MD notified:   Pt hasn't eaten since breakfast, he said his family is bringing spaghetti and he will not eat until it arrives, pt is sitting up in chair, alert and speaking with staff, no other concerns noted.  Adela Lank

## 2021-02-05 NOTE — Progress Notes (Signed)
PROGRESS NOTE        PATIENT DETAILS Name: Travis [name] Age: 65 y.o. Sex: male Date of Birth: 11-Jan-1956 Admit Date: 01/01/2021 Admitting Physician Georganna Skeans, MD GYF:VCBSW, Alyson Locket, NP  Brief Narrative:  Patient is a 65 y.o. male with history of HFrEF, CKD stage III, COPD, HTN, DM-2, HLD, nonobstructive CAD-who presented with abdominal pain-found to have sepsis from ruptured appendicitis-underwent exploratory laparotomy/ileocecectomy and drainage of intra-abdominal abscess.Patient was briefly managed in the ICU-unfortunately-on 10/14 he developed severe abdominal pain -he was then found to have hemorrhagic shock due to a spontaneous splenic rupture-he was reevaluated by general surgery and underwent a splenectomy.  He was readmitted to the ICU-remained on the ventilator-he self extubated on 10/16-he was monitored closely-upon further stability-transfer to the [facility] hospitalist service on 10/20.  Significant events: 10/09>>admit- ex lap, remained intubated post op-to ICU 10/10>>extubated 10/12>> transferred to [facility] 10/14>>hypotensive/abd pain-CT showed spleenc rupture-to OR for splenectomy. 10/16>> Self-extubated  10/18>> Increased O2 requirement to 8L 10/19>> O2 weaned to 4L 10/20>> transferred to [facility]    Radiology/Echo 10/09>> CT abdomen: Acute appendicitis with perforation 10/14>> CT abdomen: Splenic rupture with large splenic hematoma 10/15>> TTE: EF 45-50% 10/20>> CT abdomen/pelvis: Indeterminate 2.4 cm fluid collection in the right lower quadrant adjacent to the cecal resection site.  Moderate left pleural effusion with compressive atelectasis. 10/24 >> CT abdomen/pelvis: complex fluid collections in the left upper quadrant. The largest collection there is 162 mL.  10/27 >> CT abdomen/pelvis: Placement of pigtail catheter in the left upper quadrant fluid collection. 10/31>> CT abdomen/pelvis: Interval decrease size of the dominant fluid collection  in the left upper quadrant.  Subjective:  Frustrated that he is still in the [employer] approval for SNF.  Denies any chest pain or shortness of breath.  No acute issues overnight-remains stable for discharge.   Objective:  Vitals: Blood pressure 122/73, pulse 92, temperature 98.3 F (36.8 C), temperature source Oral, resp. rate 19, height 6' (1.829 m), weight 81.2 kg, SpO2 92 %.   Exam: Gen Exam:Alert awake-not in any distress HEENT:atraumatic, normocephalic Chest: B/L clear to auscultation anteriorly CVS:S1S2 regular Abdomen:soft non tender, non distended Extremities:no edema Neurology: Non focal Skin: no rash    Assessment/Plan:   Sepsis due to acute appendicitis with perforation/intra-abdominal abscesses and Bacteroides bacteremia (s/p laparotomy/ileocecectomy and drainage of intra-abdominal abscess on 10/9): Sepsis physiology has resolved-postoperatively-developed left upper quadrant fluid collection-and is s/p drain placement by IR.  On Rocephin/Flagyl (fluid cultures growing E. coli)-with plans to transition to Vantin/Flagyl on discharge for additional 10-14 days.  Staff epidermidis bacteremia: Likely a contaminant and of no clinical significance.   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  Acute hypoxic respiratory failure due to atelectasis/pleural effusion: Self extubated on 10/16-stable on anywhere from 2-4 L of oxygen-did develop some transient shortness of breath on 10/20-which responded to bronchodilators/diuretics.    Patient appears to have a moderate-sized left-sided pleural effusion which is likely transudative from-HFrEF/reactive to splenectomy.  PRN Lasix, also IR did ultrasound-guided thoracentesis on 01/17/2021 which was consistent with sympathetic exudative effusion gram stain negative cultures negative  Acute metabolic  encephalopathy: Rapidly improving-continue supportive care.  AKI on CKD stage IIIa: AKI hemodynamically mediated-hold Lasix, post hydration and packed RBC transfusion, improved.  HTN: BP stable-continue Coreg-losartan remains on  hold  Nonischemic cardiomyopathy/HFrEF: Not in exacerbation-continue Lasix and monitor volume status closely.  Follow electrolytes.   Nonobstructive CAD: No anginal symptoms-already on beta-blocker-we will resume aspirin/statin over the next few days when more stable.  Hypomagnesemia: repleted  Multifactorial anemia: Due to combination of splenic rupture, acute illness-has been transfused numerous units of PRBC-hemoglobin now stable.  Follow periodically.    HLD: Resume statin/fenofibrate when oral intake was stable.  COPD: Stable-continue bronchodilators  Functional quadriplegia/debility/deconditioning: Due to acute illness-continue PT/OT eval-either SNF or LTAC on discharge.  DM-2 (A1c 6.4 on 06/02/2020): CBGs stable-continue SSI  Recent Labs    02/04/21 1555 02/04/21 2123 02/05/21 0723  GLUCAP 140* 93 89    Failure to thrive syndrome: Appetite-functional status slowly improving-continue to encourage oral intake-continue nutritional supplements.   Nutrition Status: Nutrition Problem: Moderate Malnutrition Etiology: acute illness, chronic illness (perforated appendix, CAD, cardiomyopathy) Signs/Symptoms: mild muscle depletion, mild fat depletion Interventions: Ensure Enlive (each supplement provides 350kcal and 20 grams of protein), MVI  BMI: Estimated body mass index is 24.28 kg/m as calculated from the following:   Height as of this encounter: 6' (1.829 m).   Weight as of this encounter: 81.2 kg.    Procedures: 10/9>>exploratory laparotomy, ileocecectomy, drainage of intraabdominal abscess  10/14>> exploratory laparotomy and splenectomy 10/14>> left subclavian triple-lumen catheter 01/17/2021  >> left ultrasound-guided thoracentesis 600 cc  exudative fluid, gram stain -ve 01/17/2021  >> LUQ JP drain by IR   Consults: General surgery, PCCM DVT Prophylaxis: Heparin Code Status:DNR Family Communication: discussed with niece by phone 11/5.  Discussed with sister in law at bedside today    Diet: Diet Order             Diet regular Room service appropriate? Yes; Fluid consistency: Thin  Diet effective now                    Disposition Plan: Status is: Inpatient  Insurance has denied LTAC placement, so plan to discharge to subacute rehab  Barriers to Discharge: Waiting safe disposition plan  MEDICATIONS: Scheduled Meds:  (feeding supplement) PROSource Plus  30 mL Oral BID BM   acetaminophen  1,000 mg Oral Q6H   bisacodyl  10 mg Rectal Once   Chlorhexidine Gluconate Cloth  6 each Topical Daily   docusate sodium  100 mg Oral BID   enoxaparin (LOVENOX) injection  40 mg Subcutaneous Q24H   feeding supplement  237 mL Oral BID BM    HYDROmorphone (DILAUDID) injection  0.5 mg Intravenous Once   insulin aspart  0-20 Units Subcutaneous TID WC   mouth rinse  15 mL Mouth Rinse BID   methocarbamol  500 mg Oral QID   metoprolol tartrate  50 mg Oral BID   metroNIDAZOLE  500 mg Oral Q12H   mirtazapine  30 mg Oral QHS   multivitamin with minerals  1 tablet Oral Daily   oxyCODONE  20 mg Oral Q12H   pantoprazole  40 mg Oral Daily   polyethylene glycol  17 g Oral Daily   Continuous Infusions:  sodium chloride 10 mL/hr at 01/29/21 0610   cefTRIAXone (ROCEPHIN)  IV 2 g (02/04/21 1355)   PRN Meds:.sodium chloride, bisacodyl, dextromethorphan, HYDROmorphone (DILAUDID) injection, hydroxypropyl methylcellulose / hypromellose, ipratropium-albuterol, ondansetron (ZOFRAN) IV, oxyCODONE, phenol, simethicone, sodium chloride, sodium chloride flush   I have personally reviewed following labs and imaging studies  LABORATORY DATA:  No results for input(s): WBC, HGB, HCT, PLT, MCV, MCH, MCHC, RDW, LYMPHSABS, MONOABS, EOSABS,  BASOSABS,  BANDABS in the last 168 hours.  Invalid input(s): NEUTRABS, BANDSABD   No results for input(s): NA, K, CL, CO2, GLUCOSE, BUN, CREATININE, CALCIUM, AST, ALT, ALKPHOS, BILITOT, ALBUMIN, GLUCOSE, MG, CRP, DDIMER, PROCALCITON, LATICACIDVEN, INR, TSH, CORTISOL, HGBA1C, AMMONIA, BNP in the last 168 hours.  Invalid input(s): GFRCGP, PHOSPHOROUS, ARSCOV2NAA    RADIOLOGY STUDIES/RESULTS: No results found.   LOS: 35 days   Signature -  Oren Binet M.D on 02/05/2021 at 11:34 AM   -  To page go to [email or web address]

## 2021-02-06 ENCOUNTER — Inpatient Hospital Stay (HOSPITAL_COMMUNITY): Payer: Medicare HMO

## 2021-02-06 DIAGNOSIS — R188 Other ascites: Secondary | ICD-10-CM | POA: Diagnosis not present

## 2021-02-06 DIAGNOSIS — K3532 Acute appendicitis with perforation and localized peritonitis, without abscess: Secondary | ICD-10-CM | POA: Diagnosis not present

## 2021-02-06 DIAGNOSIS — I5022 Chronic systolic (congestive) heart failure: Secondary | ICD-10-CM | POA: Diagnosis not present

## 2021-02-06 DIAGNOSIS — N1831 Chronic kidney disease, stage 3a: Secondary | ICD-10-CM | POA: Diagnosis not present

## 2021-02-06 LAB — GLUCOSE, CAPILLARY
Glucose-Capillary: 88 mg/dL (ref 70–99)
Glucose-Capillary: 95 mg/dL (ref 70–99)
Glucose-Capillary: 142 mg/dL — ABNORMAL HIGH (ref 70–99)
Glucose-Capillary: 82 mg/dL (ref 70–99)

## 2021-02-06 MED ORDER — ACETAMINOPHEN 500 MG PO TABS: 1000.0000 mg | ORAL_TABLET | Freq: Three times a day (TID) | ORAL | 0 refills | Status: AC | PRN

## 2021-02-06 MED ORDER — OXYCODONE HCL 5 MG PO TABS: 5.0000 mg | ORAL_TABLET | Freq: Four times a day (QID) | ORAL | 0 refills | Status: DC | PRN

## 2021-02-06 MED ORDER — BISACODYL 10 MG RE SUPP: 10.0000 mg | Freq: Every day | RECTAL | 0 refills | Status: DC | PRN

## 2021-02-06 MED ORDER — POLYETHYLENE GLYCOL 3350 17 G PO PACK: 17.0000 g | PACK | Freq: Every day | ORAL | 0 refills | Status: DC

## 2021-02-06 MED ORDER — METHOCARBAMOL 500 MG PO TABS
500.0000 mg | ORAL_TABLET | Freq: Three times a day (TID) | ORAL | Status: DC | PRN
Start: 2021-02-06 — End: 2021-03-06

## 2021-02-06 MED ORDER — ASPIRIN EC 81 MG PO TBEC: 81.0000 mg | DELAYED_RELEASE_TABLET | Freq: Every day | ORAL | 11 refills | Status: DC

## 2021-02-06 MED ORDER — PROSOURCE PLUS PO LIQD: 30.0000 mL | Freq: Two times a day (BID) | ORAL | Status: DC

## 2021-02-06 MED ORDER — INSULIN ASPART 100 UNIT/ML IJ SOLN
0.0000 [IU] | Freq: Three times a day (TID) | INTRAMUSCULAR | Status: DC
  Administered 2021-02-06 – 2021-02-07 (×2): 1 [IU] via SUBCUTANEOUS

## 2021-02-06 MED ORDER — IPRATROPIUM-ALBUTEROL 0.5-2.5 (3) MG/3ML IN SOLN
3.0000 mL | Freq: Four times a day (QID) | RESPIRATORY_TRACT | Status: DC | PRN
Start: 2021-02-06 — End: 2021-02-24

## 2021-02-06 MED ORDER — METOPROLOL TARTRATE 50 MG PO TABS: 50.0000 mg | ORAL_TABLET | Freq: Two times a day (BID) | ORAL | Status: DC

## 2021-02-06 MED ORDER — MIRTAZAPINE 30 MG PO TABS: 30.0000 mg | ORAL_TABLET | Freq: Every day | ORAL | Status: DC

## 2021-02-06 MED ORDER — OXYCODONE HCL ER 20 MG PO T12A: 20.0000 mg | EXTENDED_RELEASE_TABLET | Freq: Two times a day (BID) | ORAL | 0 refills | Status: DC

## 2021-02-06 MED ORDER — INSULIN ASPART 100 UNIT/ML IJ SOLN: INTRAMUSCULAR | 11 refills | Status: DC

## 2021-02-06 NOTE — Discharge Summary (Addendum)
PATIENT DETAILS Name: Travis [name] Age: 65 y.o. Sex: male Date of Birth: 10-04-1955 MRN: [id number]. Admitting Physician: Georganna Skeans, MD HXT:AVWPV, Alyson Locket, NP  Admit Date: 01/01/2021 Discharge date: 02/06/2021  Recommendations for Outpatient Follow-up:  Follow up with PCP in 1-2 weeks Please obtain CMP/CBC in one week Please ensure follow-up with interventional radiology and general surgery.  Admitted From:  Home  Disposition: SNF   Home Health: No  Equipment/Devices: None  Discharge Condition: Stable  CODE STATUS: DNR  Diet recommendation:  Diet Order             Diet general           Diet regular Room service appropriate? Yes; Fluid consistency: Thin  Diet effective now                    Brief Narrative:  Patient is a 65 y.o. male with history of HFrEF, CKD stage III, COPD, HTN, DM-2, HLD, nonobstructive CAD-who presented with abdominal pain-found to have sepsis from ruptured appendicitis-underwent exploratory laparotomy/ileocecectomy and drainage of intra-abdominal abscess.Patient was briefly managed in the ICU-unfortunately-on 10/14 he developed severe abdominal pain -he was then found to have hemorrhagic shock due to a spontaneous splenic rupture-he was reevaluated by general surgery and underwent a splenectomy.  He was readmitted to the ICU-remained on the ventilator-he self extubated on 10/16-he was monitored closely-upon further stability-transfer to the [facility] hospitalist service on 10/20.   Significant events: 10/09>>admit- ex lap, remained intubated post op-to ICU 10/10>>extubated 10/12>> transferred to [facility] 10/14>>hypotensive/abd pain-CT showed spleenc rupture-to OR for splenectomy. 10/16>> Self-extubated  10/18>> Increased O2 requirement to 8L 10/19>> O2 weaned to 4L 10/20>> transferred to [facility]    Radiology/Echo 10/09>> CT abdomen: Acute appendicitis with perforation 10/14>> CT abdomen: Splenic rupture with large splenic  hematoma 10/15>> TTE: EF 45-50% 10/20>> CT abdomen/pelvis: Indeterminate 2.4 cm fluid collection in the right lower quadrant adjacent to the cecal resection site.  Moderate left pleural effusion with compressive atelectasis. 10/24 >> CT abdomen/pelvis: complex fluid collections in the left upper quadrant. The largest collection there is 162 mL.  10/27 >> CT abdomen/pelvis: Placement of pigtail catheter in the left upper quadrant fluid collection. 10/31>> CT abdomen/pelvis: Interval decrease size of the dominant fluid collection in the left upper quadrant.  Procedures: 10/9>>exploratory laparotomy, ileocecectomy, drainage of intraabdominal abscess  10/14>> exploratory laparotomy and splenectomy 10/14>> left subclavian triple-lumen catheter 01/17/2021  >> left ultrasound-guided thoracentesis 600 cc exudative fluid, gram stain -ve 01/17/2021  >> LUQ JP drain by IR  Brief Hospital Course: Sepsis due to acute appendicitis with perforation/intra-abdominal abscesses and Bacteroides bacteremia (s/p laparotomy/ileocecectomy and drainage of intra-abdominal abscess on 10/9): Sepsis physiology has resolved-postoperatively-developed left upper quadrant fluid collection-and is s/p drain placement by IR.  On Rocephin/Flagyl (fluid cultures growing E. coli on 10/25).  Spoke with general surgery on 11/14-okay to stop all antimicrobial therapy-as patient has been on Flagyl/Rocephin since 10/27.   Staff epidermidis bacteremia: Likely a contaminant and of no clinical significance.    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   Acute hypoxic respiratory failure due to atelectasis/pleural effusion: Self extubated on 10/16-stable on anywhere from 2-4 L of oxygen-did develop some transient shortness of breath on 10/20-which responded to bronchodilators/diuretics.    Patient appears to have a moderate-sized  left-sided pleural effusion which is likely transudative from-HFrEF/reactive to splenectomy.  Patient underwent ultrasound-guided  thoracentesis on 01/17/2021  which was consistent with sympathetic exudative effusion gram stain negative cultures negative   Acute metabolic encephalopathy: Rapidly improving-continue supportive care.   AKI on CKD stage IIIa: AKI hemodynamically mediated-hold Lasix, post hydration and packed RBC transfusion, improved.   HTN: BP stable-continue Coreg-losartan remains on hold   Nonischemic cardiomyopathy/HFrEF: Not in exacerbation-continue Lasix and monitor volume status closely.  Follow electrolytes.    Nonobstructive CAD: No anginal symptoms-already on beta-blocker-we will resume aspirin/statin over the next few days when more stable.   Hypomagnesemia: repleted   Multifactorial anemia: Due to combination of splenic rupture, acute illness-has been transfused numerous units of PRBC-hemoglobin now stable.  Follow periodically.     HLD: Resume statin/fenofibrate when oral intake was stable.   COPD: Stable-continue bronchodilators   Functional quadriplegia/debility/deconditioning: Due to acute illness-continue PT/OT eval-either SNF or LTAC on discharge.   DM-2 (A1c 6.4 on 06/02/2020): CBGs stable-continue SSI  Failure to thrive syndrome: slowly improving-continue to encourage oral intake-continue nutritional supplements.    Nutrition Status: Nutrition Problem: Moderate Malnutrition Etiology: acute illness, chronic illness (perforated appendix, CAD, cardiomyopathy) Signs/Symptoms: mild muscle depletion, mild fat depletion Interventions: Ensure Enlive (each supplement provides 350kcal and 20 grams of protein), MVI   BMI: Estimated body mass index is 24.28 kg/m as calculated from the following:   Height as of this encounter: 6' (1.829 m).   Weight as of this encounter: 81.2 kg.  Discharge Diagnoses:  Principal Problem:   Ruptured appendicitis Active  Problems:   Diabetes ([place])   HLD (hyperlipidemia)   Gout   Chronic systolic heart failure (HCC)   CRA (central retinal artery occlusion)   Essential hypertension   COPD (chronic obstructive pulmonary disease) (HCC)   CKD (chronic kidney disease), stage III (HCC)   Sepsis ([place])   Status post surgery   Malnutrition of moderate degree   Discharge Instructions:  Activity:  As tolerated with Full fall precautions use walker/cane & assistance as needed  Discharge Instructions     Call MD for:  redness, tenderness, or signs of infection (pain, swelling, redness, odor or green/yellow discharge around incision site)   Complete by: As directed    Call MD for:  severe uncontrolled pain   Complete by: As directed    Diet general   Complete by: As directed    Discharge wound care:   Complete by: As directed    1. Apply a moistened saline gauze into the abdominal wound and secure with ABD pad daily.  2. LUQ Drain Continue TID flushes with 5 cc NS. Record output Q shift. Dressing changes QD or PRN if soiled.  Call IR APP or on call IR MD if difficulty flushing or sudden change in drain output.   Increase activity slowly   Complete by: As directed       Allergies as of 02/06/2021   No Known Allergies      Medication List     STOP taking these medications    aspirin 325 MG tablet Replaced by: aspirin EC 81 MG tablet   carvedilol 6.25 MG tablet Commonly known as: COREG   cetirizine 10 MG tablet Commonly known as: ZYRTEC   fenofibrate 145 MG tablet Commonly known as: TRICOR   furosemide 40 MG tablet Commonly known as: LASIX   losartan 25 MG tablet Commonly known as: COZAAR       TAKE these medications    (feeding supplement) PROSource Plus liquid Take 30 mLs by mouth 2 (two) times daily between meals.   acetaminophen 500  MG tablet Commonly known as: TYLENOL Take 2 tablets (1,000 mg total) by mouth every 8 (eight) hours as needed.   albuterol 108 (90 Base)  MCG/ACT inhaler Commonly known as: VENTOLIN HFA Inhale 2 puffs into the lungs every 6 (six) hours as needed for wheezing or shortness of breath.   allopurinol 100 MG tablet Commonly known as: ZYLOPRIM Take 1 tablet (100 mg total) by mouth 2 (two) times daily.   aspirin EC 81 MG tablet Take 1 tablet (81 mg total) by mouth daily. Swallow whole. Replaces: aspirin 325 MG tablet   atorvastatin 20 MG tablet Commonly known as: LIPITOR Take 20 mg by mouth daily.   bisacodyl 10 MG suppository Commonly known as: DULCOLAX Place 1 suppository (10 mg total) rectally daily as needed for moderate constipation.   ferrous sulfate 325 (65 FE) MG tablet Take 1 tablet (325 mg total) by mouth daily with breakfast.   FISH OIL PO Take 1 capsule by mouth daily.   fluticasone 50 MCG/ACT nasal spray Commonly known as: FLONASE Place 1-2 sprays into both nostrils daily as needed for allergies or rhinitis.   hydroxypropyl methylcellulose / hypromellose 2.5 % ophthalmic solution Commonly known as: ISOPTO TEARS / GONIOVISC Place 1 drop into both eyes 3 (three) times daily as needed for dry eyes.   insulin aspart 100 UNIT/ML injection Commonly known as: novoLOG 0-9 Units, Subcutaneous, 3 times daily with meals CBG < 70: Implement Hypoglycemia measures CBG 70 - 120: 0 units CBG 121 - 150: 1 unit CBG 151 - 200: 2 units CBG 201 - 250: 3 units CBG 251 - 300: 5 units CBG 301 - 350: 7 units CBG 351 - 400: 9 units CBG > 400: call MD   ipratropium-albuterol 0.5-2.5 (3) MG/3ML Soln Commonly known as: DUONEB Take 3 mLs by nebulization every 6 (six) hours as needed.   magnesium oxide 400 MG tablet Commonly known as: MAG-OX Take 1 tablet (400 mg total) by mouth daily.   methocarbamol 500 MG tablet Commonly known as: ROBAXIN Take 1 tablet (500 mg total) by mouth every 8 (eight) hours as needed for muscle spasms.   metoprolol tartrate 50 MG tablet Commonly known as: LOPRESSOR Take 1 tablet (50 mg total) by  mouth 2 (two) times daily.   mirtazapine 30 MG tablet Commonly known as: REMERON Take 1 tablet (30 mg total) by mouth at bedtime.   multivitamin Tabs tablet Take 1 tablet by mouth daily with breakfast.   oxyCODONE 20 mg 12 hr tablet Commonly known as: OXYCONTIN Take 1 tablet (20 mg total) by mouth every 12 (twelve) hours.   oxyCODONE 5 MG immediate release tablet Commonly known as: Oxy IR/ROXICODONE Take 1 tablet (5 mg total) by mouth every 6 (six) hours as needed for moderate pain or severe pain.   pantoprazole 40 MG tablet Commonly known as: Protonix Take 1 tablet (40 mg total) by mouth daily.   polyethylene glycol 17 g packet Commonly known as: MIRALAX / GLYCOLAX Take 17 g by mouth daily. Start taking on: February 07, 2021   triamcinolone cream 0.1 % Commonly known as: KENALOG APPLY TO AFFECTED AREAS TWICE A DAY AS NEEDED. AVOID FACE, GROIN, OR UNDERARMS. What changed: See the new instructions.               Discharge Care Instructions  (From admission, onward)           Start     Ordered   02/06/21 0000  Discharge wound care:  Comments: 1. Apply a moistened saline gauze into the abdominal wound and secure with ABD pad daily.  2. LUQ Drain Continue TID flushes with 5 cc NS. Record output Q shift. Dressing changes QD or PRN if soiled.  Call IR APP or on call IR MD if difficulty flushing or sudden change in drain output.   02/06/21 1049            Contact information for follow-up providers     Jesusita Oka, MD. Go on 03/13/2021.   Specialty: Surgery Why: Follow up appointment scheduled for 1:50 PM. Please arrive 30 min prior to appointment time for check in. Bring photo ID and insurance information. Contact information: [address] [place] [address] [phone]         Cable, James M, NP. Schedule an appointment as soon as possible for a visit on 03/20/2021.   Specialty: Pain Medicine Why: You will need another  pneumococcal vaccine as part of your post-splenectomy vaccine series. This should be given 8 weeks after your other post-splenectomy vaccines. Contact information: [place] [address] [phone]         [facility] Follow up in 2 week(s).   Why: Clinic will contact you in the next few days regarding appointment for follow up for drain in approximately 2 weeks Contact information: [place] [address] [phone]         Wellington Hampshire, MD .   Specialty: Cardiology Contact information: [place] [address] [phone]              Contact information for after-discharge care     [facility] .   Service: Skilled Nursing Contact information: [address] [place] [phone]                    No Known Allergies    Consultations: General surgery, PCCM, IR   Other Procedures/Studies: CT ABDOMEN PELVIS WO CONTRAST  Result Date: 01/23/2021 CLINICAL DATA:  Abdominal abscess, severe abdominal pain, EXAM: CT ABDOMEN AND PELVIS WITHOUT CONTRAST TECHNIQUE: Multidetector CT imaging of the abdomen and pelvis was performed following the standard protocol without IV contrast. COMPARISON:  None. FINDINGS: Lower chest: Small left pleural effusion. Bibasilar atelectasis, left greater than right. Moderate multi-vessel coronary artery calcification. Central venous catheter tip noted within the superior cavoatrial junction. Hepatobiliary: No focal liver abnormality is seen. No gallstones, gallbladder wall thickening, or biliary dilatation. Pancreas: Unremarkable Spleen: Status post splenectomy. Percutaneous drainage catheter is again seen within the splenectomy resection bed with mixed attenuation material surrounding the catheter likely representing hemorrhagic components or residual contrast  from drainage catheter placement or evaluation. Punctate foci of gas within this region are nonspecific and may relate to flushing of the catheter or evaluation. When measured in similar fashion, the dominant collection measures 5.5 x 7.4 cm, slightly decreased in size since prior examination. Adrenals/Urinary Tract: Adrenal glands are unremarkable. Kidneys are normal, without renal calculi, focal lesion, or hydronephrosis. Bladder is unremarkable. Stomach/Bowel: Appendectomy has been performed. Stomach, small bowel, and large bowel are otherwise unremarkable. No gross free intraperitoneal gas. Mild infiltration of the greater omentum is unchanged may reflect postsurgical or residual inflammatory change. No loculated intra-abdominal fluid collections. No free intrapelvic fluid. Vascular/Lymphatic: Aortic atherosclerosis. No enlarged abdominal or pelvic lymph nodes. Reproductive: Streak artifact from right total  hip arthroplasty. Partially obscures pelvic organs. Visualized prostate gland is unremarkable. Other: Laparotomy incision with wound VAC device placed within the a superficial aspect of the incision again noted. Punctate foci of gas noted within the right lower quadrant anterior abdominal wall. Mild subcutaneous edema noted within the flanks bilaterally in keeping with mild anasarca. Musculoskeletal: Right total hip arthroplasty has been performed. No acute bone abnormality. Degenerative changes are seen within the lumbar spine. IMPRESSION: Changes of splenectomy and left upper quadrant percutaneous drainage again identified. Slight interval decrease in size of dominant collection within the left upper quadrant. High attenuation material and punctate foci of gas are nonspecific and may relate to recent catheter evaluation and/or flushing. Postsurgical changes of appendectomy with residual postoperative edema or inflammatory change within the greater omentum. No discrete drainable fluid collection identified.  Small left pleural effusion, likely reactive. Associated bibasilar compressive atelectasis, left greater than right. Aortic Atherosclerosis (ICD10-I70.0). Electronically Signed   By: Fidela Salisbury M.D.   On: 01/23/2021 01:48   CT ABDOMEN PELVIS WO CONTRAST  Result Date: 01/19/2021 CLINICAL DATA:  Abdominal pain, history of ruptured appendicitis EXAM: CT ABDOMEN AND PELVIS WITHOUT CONTRAST TECHNIQUE: Multidetector CT imaging of the abdomen and pelvis was performed following the standard protocol without IV contrast. COMPARISON:  01/16/2021 FINDINGS: Lower chest: No acute abnormality. Hepatobiliary: No solid liver abnormality is seen. Small gallstone near the gallbladder neck (series 3, image 39). Gallbladder wall thickening, or biliary dilatation. Pancreas: Unremarkable. No pancreatic ductal dilatation or surrounding inflammatory changes. Spleen: Status post splenectomy. Interval placement of percutaneous pigtail drain catheters in the left upper quadrant within an air and fluid collection, the dominant component in which the catheter is placed measuring 7.9 x 6.6 cm, previously 8.5 x 6.2 cm (series 3, image 21). Adrenals/Urinary Tract: Adrenal glands are unremarkable. Kidneys are normal, without renal calculi, solid lesion, or hydronephrosis. Bladder is unremarkable. Stomach/Bowel: Stomach is within normal limits. Enteric contrast present in the colon. Redemonstrated postoperative findings status post appendectomy. There has been interval removal of a surgical drain in the right lower quadrant. Previously seen small, rim enhancing fluid collection in the right lower quadrant is not significantly changed, measuring 1.5 cm (series 3, image 65). Vascular/Lymphatic: Aortic atherosclerosis. No enlarged abdominal or pelvic lymph nodes. Reproductive: No mass or other significant abnormality. Other: No abdominal wall hernia or abnormality. No abdominopelvic ascites. Musculoskeletal: No acute or significant osseous  findings. Status post right hip total arthroplasty. IMPRESSION: 1. Interval placement of percutaneous pigtail drain catheter in the left upper quadrant within an air and fluid collection, the dominant component in which the catheter is placed measuring 7.9 x 6.6 cm, previously 8.5 x 6.2 cm. 2. Previously seen small, rim enhancing fluid collection in the right lower quadrant is not significantly changed, measuring 1.5 cm. 3. Redemonstrated postoperative findings status post appendectomy and splenectomy. There has been interval removal of a surgical drain in the right lower quadrant. 4. Cholelithiasis. Aortic Atherosclerosis (ICD10-I70.0). Electronically Signed   By: Delanna Ahmadi M.D.   On: 01/19/2021 12:32   DG Chest 1 View  Result Date: 01/17/2021 CLINICAL DATA:  Status post left thoracentesis. EXAM: CHEST  1 VIEW COMPARISON:  Chest x-ray obtained earlier today FINDINGS: No evidence of pneumothorax. Reduced left pleural effusion. New percutaneous drainage catheter projects over the left upper quadrant. Left upper extremity PICC. Catheter tip terminates over the right atrium. Very low lung volumes. Pulmonary vascular congestion. IMPRESSION: No evidence of pneumothorax or other complication following left-sided thoracentesis. Electronically Signed  By: Jacqulynn Cadet M.D.   On: 01/17/2021 16:33   DG Chest 1 View  Result Date: 01/17/2021 CLINICAL DATA:  Shortness of breath. EXAM: CHEST  1 VIEW COMPARISON:  January 16, 2021. FINDINGS: Stable cardiomediastinal silhouette. Left-sided PICC line is unchanged in position. Large left lung opacity is noted concerning for pneumonia or atelectasis with associated pleural effusion. Right basilar subsegmental atelectasis is noted. Status post right shoulder arthroplasty. IMPRESSION: Large left lung opacity is again noted concerning for pneumonia or atelectasis with associated pleural effusion. Electronically Signed   By: Marijo Conception M.D.   On: 01/17/2021 09:30    DG Abd 1 View  Result Date: 01/17/2021 CLINICAL DATA:  Abdominal pain. EXAM: ABDOMEN - 1 VIEW COMPARISON:  January 15, 2021. FINDINGS: The bowel gas pattern is normal. No radio-opaque calculi or other significant radiographic abnormality are seen. IMPRESSION: Negative. Electronically Signed   By: Marijo Conception M.D.   On: 01/17/2021 09:31   DG Abd 1 View  Result Date: 01/15/2021 CLINICAL DATA:  Abdominal pain. EXAM: ABDOMEN - 1 VIEW COMPARISON:  01/10/2021 FINDINGS: Nasogastric 2 has been withdrawn or removed. Bowel gas pattern is nonobstructive. Residual contrast is identified in nondilated loops of colon. Moderate degenerative changes in the lumbar spine. Remote RIGHT hip arthroplasty. IMPRESSION: Nonobstructive bowel gas pattern. Residual contrast in nondilated colon. Electronically Signed   By: Nolon Nations M.D.   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 Patient now has systemic evidence of infection with a complex fluid collection versus hematoma in the left subdiaphragmatic space with an associated left-sided pleural effusion. He presents for thoracentesis and percutaneous abscess drain placement. EXAM: ULTRASOUND GUIDED ABSCESS DRAINAGE; US THORACENTESIS ASP PLEURAL SPACE W/IMG GUIDE MEDICATIONS: The patient is currently admitted to the hospital and receiving intravenous antibiotics. The antibiotics were administered within an appropriate time frame prior to the initiation of the procedure. ANESTHESIA/SEDATION: Fentanyl 75 mcg IV; Versed 1.5 mg IV Moderate Sedation Time: 27 minutes The patient was continuously monitored during the procedure by the interventional radiology nurse under my direct supervision. COMPLICATIONS: None immediate. PROCEDURE: Informed written consent was obtained from the patient  after a thorough discussion of the procedural risks, benefits and alternatives. All questions were addressed. Maximal Sterile Barrier Technique was utilized including caps, mask, sterile gowns, sterile gloves, sterile drape, hand hygiene and skin antiseptic. A timeout was performed prior to the initiation of the procedure. Attention was first turned to the right-sided pleural effusion. The right thorax was interrogated with ultrasound. A minimally complex pleural effusion is identified. Local anesthesia was attained by infiltration with 1% lidocaine. A small dermatotomy was made. Under real-time ultrasound guidance, a 5 [place] Yueh centesis catheter was advanced in the pleural space. The catheter was then attached to a evacuated bottle and thoracentesis was performed yielding approximately 600 mL serosanguineous fluid. A sample was reserved for labs. The Yueh centesis catheter was removed and a Band-Aid applied. Next, the left upper quadrant subdiaphragmatic collection was assessed sonographically. A highly complex fluid collection is present in the left upper quadrant. A suitable skin entry site was again selected and marked. Local anesthesia was attained by infiltration with 1% lidocaine. A small dermatotomy was made. Under real-time ultrasound guidance, a 17 gauge introducer needle was advanced into the fluid collection. Through this, a 0.035 wire was advanced into the fluid collection. The introducer  needle was removed. The tract was dilated to 69 [place]. A Cook 12 [place] all-purpose drainage catheter was advanced over the wire and into the fluid collection. Aspiration yields thick turbid bloody fluid. Approximately 40 mL was successfully aspirated. The catheter was then connected to JP bulb suction and secured to the skin with 0 Prolene suture and an adhesive fixation device. The patient tolerated the procedure well. IMPRESSION: 1. Successful left thoracentesis yielding 600 mL serosanguineous pleural fluid. 2.  Successful placement of a 12 French drainage catheter into the complex fluid collection in the left subdiaphragmatic space with aspiration of thick, turbid bloody fluid consistent with hematoma. A sample was sent for Gram stain and culture. Electronically Signed   By: Jacqulynn Cadet M.D.   On: 01/17/2021 16:32   CT ABDOMEN PELVIS W CONTRAST  Result Date: 01/16/2021 CLINICAL DATA:  65 year old male status post partial colon resection and splenectomy splenectomy site seroma/hematoma. Abdominal distension. EXAM: CT ABDOMEN AND PELVIS WITH CONTRAST TECHNIQUE: Multidetector CT imaging of the abdomen and pelvis was performed using the standard protocol following bolus administration of intravenous contrast. CONTRAST:  4mL OMNIPAQUE IOHEXOL 350 MG/ML SOLN COMPARISON:  CT Chest, Abdomen and Pelvis 01/12/2021 FINDINGS: Lower chest: Moderate left pleural effusion with simple fluid density but substantial compressive atelectasis of the left lower lobe has not regressed. No pericardial effusion. Right lower lobe consolidation persists, indeterminate for severe atelectasis (the parenchyma is enhancing) versus pneumonia. Middle lobes remain aerated. Hepatobiliary: Stable, negative. Pancreas: The pancreas remains within normal limits, although there is inflammation at the tail of the pancreas from the splenic region. Spleen: Splenectomy with complex mixed density and the larger component partially rim enhancing fluid collection in the left upper quadrant at the splenectomy bed. The larger component of organized collection there is 62 x 83 by 63 mm for an estimated volume of 162 mL. A smaller rim enhancing component is anterior and lateral to the dominant collection as seen on series 3, image 23. Associated mass effect on the gastric cardia and proximal greater curve, and some of the fluid is inseparable from the gastric serosa (series 3, image 19). No gas within the collections. Adrenals/Urinary Tract: Urinary bladder  partially obscured by streak artifact from the hip arthroplasty. Stable adrenal glands and kidneys. Symmetric renal enhancement and early contrast excretion on the delayed images. Stomach/Bowel: Oral contrast throughout the residual large bowel, which appears negative from the hepatic flexure distally. Right lower quadrant percutaneous drain with regional mesenteric inflammation, mild architectural distortion at the cecum. The small discontiguous rim enhancing fluid collections in the lower small bowel mesentery at the pelvic inlet persist and are rim enhancing today on series 3, image 68, but have not increased in size. Collectively these are less than 10 mL. Only mild inflammation of the distal small bowel now. No dilated small bowel. Duodenal diverticulum redemonstrated. Stable ventral midline abdominal postoperative changes. Mass effect and architectural distortion on the stomach is described with the spleen. No free air identified. Vascular/Lymphatic: Aortoiliac calcified atherosclerosis. Major arterial structures in the abdomen and pelvis remain patent. Portal venous system is patent. No lymphadenopathy identified. Reproductive: Negative. Other: No pelvic free fluid. Musculoskeletal: Advanced lumbar spine degeneration. Right hip arthroplasty. No acute osseous abnormality identified. IMPRESSION: 1. Splenectomy with organizing complex fluid collections in the left upper quadrant. The largest collection there is 162 mL. Smaller adjacent collections, some inseparable from the gastric serosa and with associated mass effect and architectural distortion on the stomach. Again favor hematoma/seroma. Abscess is not excluded, but  there is no gas within the collections. 2. Stable small discontiguous rim enhancing fluid collections in the lower small bowel mesentery at the pelvic inlet a short distance from the percutaneous drain. These have not significantly changed and collectively are less than 10 mL. 3. Moderate left  pleural effusion with substantial left lower lobe atelectasis persists. Right lower lobe consolidation persists, indeterminate for atelectasis versus pneumonia. 4. No bowel obstruction or new abnormality identified in the abdomen or pelvis. 5. Aortic Atherosclerosis (ICD10-I70.0). Electronically Signed   By: Genevie Ann M.D.   On: 01/16/2021 04:34   CT CHEST ABDOMEN PELVIS W CONTRAST  Result Date: 01/12/2021 CLINICAL DATA:  History of partial colon resection and splenectomy, chest pain, abdominal pain EXAM: CT CHEST, ABDOMEN, AND PELVIS WITH CONTRAST TECHNIQUE: Multidetector CT imaging of the chest, abdomen and pelvis was performed following the standard protocol during bolus administration of intravenous contrast. CONTRAST:  160mL OMNIPAQUE IOHEXOL 300 MG/ML  SOLN COMPARISON:  01/06/2021, 01/01/2021 FINDINGS: CT CHEST FINDINGS Cardiovascular: The heart is unremarkable without pericardial effusion. No evidence of thoracic aortic aneurysm or dissection. Minimal atherosclerosis. There is a left subclavian central venous catheter tip within the superior vena cava. Mediastinum/Nodes: Borderline enlarged mediastinal lymph nodes measure up to 12 mm in the precarinal region, likely reactive. Thyroid, trachea, and esophagus are unremarkable. Lungs/Pleura: There is aim moderate left pleural effusion, volume estimated less than 1 L. Dense left lower lobe atelectasis is also noted. Dependent right lower lobe atelectasis is also seen. No pneumothorax. Central airways are patent. Musculoskeletal: There are no acute or destructive bony lesions. Reconstructed images demonstrate no additional findings. CT ABDOMEN PELVIS FINDINGS Hepatobiliary: Small calcified gallstones are again identified without evidence of cholecystitis. The liver is unremarkable. No intrahepatic biliary duct dilation. Pancreas: Unremarkable. No pancreatic ductal dilatation or surrounding inflammatory changes. Spleen: Interval postsurgical changes from  splenectomy. Residual hematoma within the splenectomy bed measures 4.9 x 7.8 cm. Adrenals/Urinary Tract: The kidneys enhance normally and symmetrically. No urinary tract calculi or obstructive uropathy. The adrenals and bladder are unremarkable. Stomach/Bowel: Postsurgical changes are seen from cecal resection with ileocolic anastomosis. No evidence of bowel obstruction or ileus. No bowel wall thickening or inflammatory change. Vascular/Lymphatic: Aortic atherosclerosis. No enlarged abdominal or pelvic lymph nodes. Reproductive: Prostate is unremarkable. Other: Postsurgical changes are seen from prior midline laparotomy. A surgical drain enters the abdomen in the right lower quadrant, tip within the left hemipelvis. A small fluid collection in the left upper quadrant adjacent to the post splenectomy hematoma measures 5.9 x 4.0 cm image 55/3, with a punctate focus of internal gas, likely representing postoperative seroma. This does not demonstrate any rim enhancement to suggest abscess. There is trace free fluid within the lower abdomen and pelvis. A small localized fluid collection in the right lower quadrant image 100/3 measures 2.4 x 1.9 cm, indeterminate at this time. This could reflect organizing phlegmon or developing abscess, and continued follow-up is recommended. Musculoskeletal: Stable right hip arthroplasty. There are no acute or destructive bony lesions. Reconstructed images demonstrate no additional findings. IMPRESSION: 1. 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 2. Indeterminate 2.4 cm fluid collection right lower quadrant adjacent to the cecal resection site. This could reflect organizing phlegmon, and continued follow-up is recommended. 3. Likely postoperative seroma and residual hematoma at the splenectomy surgical site. No evidence of abscess at this location. 4. Moderate left pleural effusion with underlying compressive left lower lobe atelectasis. 5. Cholelithiasis without  cholecystitis. 6.  Aortic Atherosclerosis (ICD10-I70.0). Electronically Signed  By: Randa Ngo M.D.   On: 01/12/2021 15:49   DG Chest Port 1 View  Result Date: 01/21/2021 CLINICAL DATA:  Shortness of breath EXAM: PORTABLE CHEST 1 VIEW COMPARISON:  Two days ago FINDINGS: Low volume chest with artifact from EKG leads. Left PICC with tip at the upper right atrium. Left upper quadrant drain. Normal heart size and stable mediastinal contours. Unchanged haziness of the left more than right chest with atelectasis and pleural fluid. IMPRESSION: Stable atelectasis and pleural fluid asymmetric to the left. Electronically Signed   By: Jorje Guild M.D.   On: 01/21/2021 08:04   DG Chest Port 1 View  Result Date: 01/19/2021 CLINICAL DATA:  Shortness of breath. EXAM: PORTABLE CHEST 1 VIEW COMPARISON:  January 17, 2021. FINDINGS: Stable cardiomediastinal silhouette. Left-sided pleural drainage catheter is unchanged in position. Stable small left pleural effusion is noted with associated left basilar atelectasis. Mild right basilar atelectasis is noted as well. Bony thorax is unremarkable. PICC line is unchanged. No pneumothorax is noted. IMPRESSION: Stable left-sided pleural drainage catheter is noted with associated left pleural effusion and adjacent atelectasis. Stable mild right basilar atelectasis is noted. Electronically Signed   By: Marijo Conception M.D.   On: 01/19/2021 08:01   DG Chest Port 1 View  Result Date: 01/16/2021 CLINICAL DATA:  Shortness of breath.  Asthma. EXAM: PORTABLE CHEST 1 VIEW COMPARISON:  01/14/2020 FINDINGS: Left arm PICC is been placed with the tip in the mid right atrium. Withdraw 4 cm to be above the right atrium if desired. Artifact overlies the chest otherwise. Hazy opacity overlying the left chest probably relates to a layering effusion with associated dependent atelectasis. Mild atelectasis present in the right lower lobe. IMPRESSION: Left arm PICC tip in the right atrium.  Withdraw 4 cm be above the right atrium if desired. Layering pleural effusion on the left with atelectatic changes of the left lung. Lesser patchy atelectasis at the right lung base. Electronically Signed   By: Nelson Chimes M.D.   On: 01/16/2021 08:38   DG CHEST PORT 1 VIEW  Result Date: 01/13/2021 CLINICAL DATA:  Respiratory distress EXAM: PORTABLE CHEST 1 VIEW COMPARISON:  01/10/2021 FINDINGS: Low lung volumes. Moderate left and small right pleural effusions. No frank interstitial edema. No pneumothorax. The heart is normal in size. Left subclavian venous catheter terminates in the mid SVC. Right shoulder arthroplasty. IMPRESSION: Moderate left and small right pleural effusions. No frank interstitial edema. Electronically Signed   By: Julian Hy M.D.   On: 01/13/2021 02:34   DG CHEST PORT 1 VIEW  Result Date: 01/10/2021 CLINICAL DATA:  Hypoxemia EXAM: PORTABLE CHEST 1 VIEW COMPARISON:  October sixteenth, 2022. FINDINGS: Evaluation is limited secondary to rotation. The cardiomediastinal silhouette is unchanged in contour.Interval extubation. The enteric tube courses through the chest to the abdomen beyond the field-of-view. Revisualization of the LEFT subclavian approach central catheter with tip terminating over the region of the confluence of the LEFT brachiocephalic vein and SVC. Low lung volumes. Decreased conspicuity of bilateral small pleural effusions. No pneumothorax. LEFT retrocardiac homogeneous opacity most consistent with atelectasis. Visualized abdomen is unremarkable. Status post RIGHT shoulder arthroplasty. IMPRESSION: Interval extubation. LEFT basilar homogeneous opacity, likely atelectasis. Electronically Signed   By: Valentino Saxon M.D.   On: 01/10/2021 08:20   DG CHEST PORT 1 VIEW  Result Date: 01/08/2021 CLINICAL DATA:  Endotracheal tube EXAM: PORTABLE CHEST 1 VIEW COMPARISON:  01/07/2021 FINDINGS: No significant change in AP portable chest radiograph, support apparatus  including endotracheal tube, left subclavian vascular catheter, esophagogastric tube. Small, layering bilateral pleural effusions. Heart and mediastinum unremarkable. IMPRESSION: 1. No significant change in AP portable chest radiograph, support apparatus including endotracheal tube, left subclavian vascular catheter, esophagogastric tube. 2. Small, layering bilateral pleural effusions. Electronically Signed   By: Delanna Ahmadi M.D.   On: 01/08/2021 13:09   DG Abd Portable 1V  Result Date: 01/10/2021 CLINICAL DATA:  NG tube placement EXAM: PORTABLE ABDOMEN - 1 VIEW COMPARISON:  KUB 01/06/2021 FINDINGS: The enteric catheter tip and side hole project over the stomach. There is a nonobstructive bowel gas pattern. Enteric contrast is seen in the right colon, partially imaged. The lungs are better assessed on the same day chest radiograph. IMPRESSION: NG tube tip and side hole in the stomach. Electronically Signed   By: Valetta Mole M.D.   On: 01/10/2021 16:39   [place] EKG SITE RITE  Result Date: 01/13/2021 If Site Rite image not attached, placement could not be confirmed due to current cardiac rhythm.  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 Patient now has systemic evidence of infection with a complex fluid collection versus hematoma in the left subdiaphragmatic space with an associated left-sided pleural effusion. He presents for thoracentesis and percutaneous abscess drain placement. EXAM: ULTRASOUND GUIDED ABSCESS DRAINAGE; US THORACENTESIS ASP PLEURAL SPACE W/IMG GUIDE MEDICATIONS: The patient is currently admitted to the hospital and receiving intravenous antibiotics. The antibiotics were administered within an appropriate time frame prior to the initiation of the procedure. ANESTHESIA/SEDATION: Fentanyl 75 mcg  IV; Versed 1.5 mg IV Moderate Sedation Time: 27 minutes The patient was continuously monitored during the procedure by the interventional radiology nurse under my direct supervision. COMPLICATIONS: None immediate. PROCEDURE: Informed written consent was obtained from the patient after a thorough discussion of the procedural risks, benefits and alternatives. All questions were addressed. Maximal Sterile Barrier Technique was utilized including caps, mask, sterile gowns, sterile gloves, sterile drape, hand hygiene and skin antiseptic. A timeout was performed prior to the initiation of the procedure. Attention was first turned to the right-sided pleural effusion. The right thorax was interrogated with ultrasound. A minimally complex pleural effusion is identified. Local anesthesia was attained by infiltration with 1% lidocaine. A small dermatotomy was made. Under real-time ultrasound guidance, a 5 [place] Yueh centesis catheter was advanced in the pleural space. The catheter was then attached to a evacuated bottle and thoracentesis was performed yielding approximately 600 mL serosanguineous fluid. A sample was reserved for labs. The Yueh centesis catheter was removed and a Band-Aid applied. Next, the left upper quadrant subdiaphragmatic collection was assessed sonographically. A highly complex fluid collection is present in the left upper quadrant. A suitable skin entry site was again selected and marked. Local anesthesia was attained by infiltration with 1% lidocaine. A small dermatotomy was made. Under real-time ultrasound guidance, a 17 gauge introducer needle was advanced into the fluid collection. Through this, a 0.035 wire was advanced into the fluid collection. The introducer needle was removed. The tract was dilated to 68 [place]. A Cook 12 [place] all-purpose drainage catheter was advanced over the wire and into the fluid collection. Aspiration yields thick turbid bloody fluid. Approximately 40 mL was  successfully aspirated. The catheter was then connected to JP bulb suction and secured to the skin with 0 Prolene suture and an adhesive fixation device. The patient tolerated the  procedure well. IMPRESSION: 1. Successful left thoracentesis yielding 600 mL serosanguineous pleural fluid. 2. Successful placement of a 12 French drainage catheter into the complex fluid collection in the left subdiaphragmatic space with aspiration of thick, turbid bloody fluid consistent with hematoma. A sample was sent for Gram stain and culture. Electronically Signed   By: Jacqulynn Cadet M.D.   On: 01/17/2021 16:32     TODAY-DAY OF DISCHARGE:  Subjective:   Travis [name] today has no headache,no chest abdominal pain,no new weakness tingling or numbness, feels much better wants to go home today.   Objective:   Blood pressure 124/81, pulse 87, temperature 98.4 F (36.9 C), temperature source Oral, resp. rate 17, height 6' (1.829 m), weight 79.5 kg, SpO2 92 %.  Intake/Output Summary (Last 24 hours) at 02/06/2021 1049 Last data filed at 02/06/2021 3875 Gross per 24 hour  Intake 130 ml  Output 267 ml  Net -137 ml   Filed Weights   02/04/21 0500 02/05/21 0500 02/06/21 0431  Weight: 82 kg 81.2 kg 79.5 kg    Exam: Awake Alert, Oriented *3, No new F.N deficits, Normal affect [place].AT,PERRAL Supple Neck,No JVD, No cervical lymphadenopathy appriciated.  Symmetrical Chest wall movement, Good air movement bilaterally, CTAB RRR,No Gallops,Rubs or new Murmurs, No Parasternal Heave +ve B.Sounds, Abd Soft, Non tender, No organomegaly appriciated, No rebound -guarding or rigidity. No Cyanosis, Clubbing or edema, No new Rash or bruise   PERTINENT RADIOLOGIC STUDIES: No results found.   PERTINENT LAB RESULTS: CBC: No results for input(s): WBC, HGB, HCT, PLT in the last 72 hours. CMET CMP     Component Value Date/Time   NA 138 01/27/2021 0354   NA 138 06/20/2020 1034   NA 141 04/20/2014 1019   K 3.8  01/27/2021 0354   K 3.7 04/20/2014 1019   CL 104 01/27/2021 0354   CL 106 04/20/2014 1019   CO2 25 01/27/2021 0354   CO2 30 04/20/2014 1019   GLUCOSE 115 (H) 01/27/2021 0354   GLUCOSE 102 (H) 04/20/2014 1019   BUN 23 01/27/2021 0354   BUN 18 06/20/2020 1034   BUN 14 04/20/2014 1019   CREATININE 1.10 01/27/2021 0354   CREATININE 1.18 04/20/2014 1019   CALCIUM 9.8 01/27/2021 0354   CALCIUM 9.3 04/20/2014 1019   PROT 6.8 01/27/2021 0354   PROT 6.5 06/18/2017 1425   PROT 7.4 04/20/2014 1019   ALBUMIN 1.9 (L) 01/27/2021 0354   ALBUMIN 4.0 06/18/2017 1425   ALBUMIN 3.4 04/20/2014 1019   AST 25 01/27/2021 0354   AST 21 04/20/2014 1019   ALT 24 01/27/2021 0354   ALT 20 04/20/2014 1019   ALKPHOS 141 (H) 01/27/2021 0354   ALKPHOS 97 04/20/2014 1019   BILITOT 1.1 01/27/2021 0354   BILITOT 0.4 06/18/2017 1425   BILITOT 0.6 04/20/2014 1019   GFRNONAA >60 01/27/2021 0354   GFRNONAA >60 04/20/2014 1019   GFRNONAA >60 11/19/2012 1107   GFRAA 53 (L) 01/13/2020 1410   GFRAA >60 04/20/2014 1019   GFRAA >60 11/19/2012 1107    GFR Estimated Creatinine Clearance: 73.5 mL/min (by C-G formula based on SCr of 1.1 mg/dL). No results for input(s): LIPASE, AMYLASE in the last 72 hours. No results for input(s): CKTOTAL, CKMB, CKMBINDEX, TROPONINI in the last 72 hours. Invalid input(s): POCBNP No results for input(s): DDIMER in the last 72 hours. No results for input(s): HGBA1C in the last 72 hours. No results for input(s): CHOL, HDL, LDLCALC, TRIG, CHOLHDL, LDLDIRECT in the last 72 hours.  No results for input(s): TSH, T4TOTAL, T3FREE, THYROIDAB in the last 72 hours.  Invalid input(s): FREET3 No results for input(s): VITAMINB12, FOLATE, FERRITIN, TIBC, IRON, RETICCTPCT in the last 72 hours. Coags: No results for input(s): INR in the last 72 hours.  Invalid input(s): PT Microbiology: Recent Results (from the past 240 hour(s))  Resp Panel by RT-PCR (Flu A&B, Covid) Nasopharyngeal Swab      Status: None   Collection Time: 02/03/21 11:13 AM   Specimen: Nasopharyngeal Swab; Nasopharyngeal(NP) swabs in vial transport medium  Result Value Ref Range Status   SARS Coronavirus 2 by RT PCR NEGATIVE NEGATIVE Final    Comment: (NOTE) SARS-CoV-2 target nucleic acids are NOT DETECTED.  The SARS-CoV-2 RNA is generally detectable in upper respiratory specimens during the acute phase of infection. The lowest concentration of SARS-CoV-2 viral copies this assay can detect is 138 copies/mL. A negative result does not preclude SARS-Cov-2 infection and should not be used as the sole basis for treatment or other patient management decisions. A negative result may occur with  improper specimen collection/handling, submission of specimen other than nasopharyngeal swab, presence of viral mutation(s) within the areas targeted by this assay, and inadequate number of viral copies(<138 copies/mL). A negative result must be combined with clinical observations, patient history, and epidemiological information. The expected result is Negative.  Fact Sheet for Patients:  EntrepreneurPulse.com.au  Fact Sheet for Healthcare Providers:  IncredibleEmployment.be  This test is no t yet approved or cleared by the [place] FDA and  has been authorized for detection and/or diagnosis of SARS-CoV-2 by FDA under an Emergency Use Authorization (EUA). This EUA will remain  in effect (meaning this test can be used) for the duration of the COVID-19 declaration under Section 564(b)(1) of the Act, 21 U.S.C.section 360bbb-3(b)(1), unless the authorization is terminated  or revoked sooner.       Influenza A by PCR NEGATIVE NEGATIVE Final   Influenza B by PCR NEGATIVE NEGATIVE Final    Comment: (NOTE) The Xpert Xpress SARS-CoV-2/FLU/RSV plus assay is intended as an aid in the diagnosis of influenza from Nasopharyngeal swab specimens and should not be used as a sole basis  for treatment. Nasal washings and aspirates are unacceptable for Xpert Xpress SARS-CoV-2/FLU/RSV testing.  Fact Sheet for Patients: EntrepreneurPulse.com.au  Fact Sheet for Healthcare Providers: IncredibleEmployment.be  This test is not yet approved or cleared by the [place] FDA and has been authorized for detection and/or diagnosis of SARS-CoV-2 by FDA under an Emergency Use Authorization (EUA). This EUA will remain in effect (meaning this test can be used) for the duration of the COVID-19 declaration under Section 564(b)(1) of the Act, 21 U.S.C. section 360bbb-3(b)(1), unless the authorization is terminated or revoked.  Performed at [facility], [place] [address] [place], [place] [address]     FURTHER DISCHARGE INSTRUCTIONS:  Get Medicines reviewed and adjusted: Please take all your medications with you for your next visit with your Primary MD  Laboratory/radiological data: Please request your Primary MD to go over all hospital tests and procedure/radiological results at the follow up, please ask your Primary MD to get all Hospital records sent to his/her office.  In some cases, they will be blood work, cultures and biopsy results pending at the time of your discharge. Please request that your primary care M.D. goes through all the records of your hospital data and follows up on these results.  Also Note the following: If you experience worsening of your admission symptoms, develop shortness  of breath, life threatening emergency, suicidal or homicidal thoughts you must seek medical attention immediately by calling 911 or calling your MD immediately  if symptoms less severe.  You must read complete instructions/literature along with all the possible adverse reactions/side effects for all the Medicines you take and that have been prescribed to you. Take any new Medicines after you have completely understood and accpet all the possible  adverse reactions/side effects.   Do not drive when taking Pain medications or sleeping medications (Benzodaizepines)  Do not take more than prescribed Pain, Sleep and Anxiety Medications. It is not advisable to combine anxiety,sleep and pain medications without talking with your primary care practitioner  Special Instructions: If you have smoked or chewed Tobacco  in the last 2 yrs please stop smoking, stop any regular Alcohol  and or any Recreational drug use.  Wear Seat belts while driving.  Please note: You were cared for by a hospitalist during your hospital stay. Once you are discharged, your primary care physician will handle any further medical issues. Please note that NO REFILLS for any discharge medications will be authorized once you are discharged, as it is imperative that you return to your primary care physician (or establish a relationship with a primary care physician if you do not have one) for your post hospital discharge needs so that they can reassess your need for medications and monitor your lab values.  Total Time spent coordinating discharge including counseling, education and face to face time equals 35 minutes.  SignedOren Binet 02/06/2021 10:49 AM

## 2021-02-06 NOTE — Plan of Care (Signed)
Pt currently lying in bed with visitors at bedside, pt denies any needs or assistance at this time, pt ambulated with pt in hall, ambulated to sink and performed chg wipes and peri care, out of bed to chair for lunch, room air, abd dressing clean and intact, jp drain with brown fluid noted, sbar to be provided to on coming staff.  Problem: Health Behavior/Discharge Planning: Goal: Ability to manage health-related needs will improve Outcome: Progressing   Problem: Clinical Measurements: Goal: Ability to maintain clinical measurements within normal limits will improve Outcome: Progressing   Problem: Activity: Goal: Risk for activity intolerance will decrease Outcome: Progressing   Problem: Nutrition: Goal: Adequate nutrition will be maintained Outcome: Not Progressing

## 2021-02-06 NOTE — Progress Notes (Signed)
Progress Note  31 Days Post-Op  Subjective: Pt denies abdominal pain. Tolerating diet and trying to increase PO intake.   Objective: Vital signs in last 24 hours: Temp:  [97.9 F (36.6 C)-98.7 F (37.1 C)] 98.4 F (36.9 C) (11/14 0723) Pulse Rate:  [85-102] 87 (11/14 0723) Resp:  [16-17] 17 (11/14 0723) BP: (107-135)/(71-81) 124/81 (11/14 0723) SpO2:  [90 %-94 %] 92 % (11/14 0723) Weight:  [79.5 kg] 79.5 kg (11/14 0431) Last BM Date: 02/04/21  Intake/Output from previous day: 11/13 0701 - 11/14 0700 In: 250 [P.O.:240] Out: 287 [Urine:250; Drains:37] Intake/Output this shift: Total I/O In: -  Out: 200 [Urine:200]  PE: Gen: resting comfortably, NAD Pulm: rate and effort normal Abd: Soft, mild distension, nontender, midline wound clean with healthy granulation tissue, drain with with scant dark bloody fluid   Lab Results:  No results for input(s): WBC, HGB, HCT, PLT in the last 72 hours. BMET No results for input(s): NA, K, CL, CO2, GLUCOSE, BUN, CREATININE, CALCIUM in the last 72 hours. PT/INR No results for input(s): LABPROT, INR in the last 72 hours. CMP     Component Value Date/Time   NA 138 01/27/2021 0354   NA 138 06/20/2020 1034   NA 141 04/20/2014 1019   K 3.8 01/27/2021 0354   K 3.7 04/20/2014 1019   CL 104 01/27/2021 0354   CL 106 04/20/2014 1019   CO2 25 01/27/2021 0354   CO2 30 04/20/2014 1019   GLUCOSE 115 (H) 01/27/2021 0354   GLUCOSE 102 (H) 04/20/2014 1019   BUN 23 01/27/2021 0354   BUN 18 06/20/2020 1034   BUN 14 04/20/2014 1019   CREATININE 1.10 01/27/2021 0354   CREATININE 1.18 04/20/2014 1019   CALCIUM 9.8 01/27/2021 0354   CALCIUM 9.3 04/20/2014 1019   PROT 6.8 01/27/2021 0354   PROT 6.5 06/18/2017 1425   PROT 7.4 04/20/2014 1019   ALBUMIN 1.9 (L) 01/27/2021 0354   ALBUMIN 4.0 06/18/2017 1425   ALBUMIN 3.4 04/20/2014 1019   AST 25 01/27/2021 0354   AST 21 04/20/2014 1019   ALT 24 01/27/2021 0354   ALT 20 04/20/2014 1019    ALKPHOS 141 (H) 01/27/2021 0354   ALKPHOS 97 04/20/2014 1019   BILITOT 1.1 01/27/2021 0354   BILITOT 0.4 06/18/2017 1425   BILITOT 0.6 04/20/2014 1019   GFRNONAA >60 01/27/2021 0354   GFRNONAA >60 04/20/2014 1019   GFRNONAA >60 11/19/2012 1107   GFRAA 53 (L) 01/13/2020 1410   GFRAA >60 04/20/2014 1019   GFRAA >60 11/19/2012 1107   Lipase     Component Value Date/Time   LIPASE 31 01/01/2021 1023   LIPASE 112 04/20/2014 1019       Studies/Results: No results found.  Anti-infectives: Anti-infectives (From admission, onward)    Start     Dose/Rate Route Frequency Ordered Stop   01/19/21 1200  cefTRIAXone (ROCEPHIN) 2 g in sodium chloride 0.9 % 100 mL IVPB        2 g 200 mL/hr over 30 Minutes Intravenous Every 24 hours 01/19/21 1056     01/19/21 1145  metroNIDAZOLE (FLAGYL) tablet 500 mg        500 mg Oral Every 12 hours 01/19/21 1056     01/18/21 0615  vancomycin (VANCOREADY) IVPB 1250 mg/250 mL  Status:  Discontinued        1,250 mg 166.7 mL/hr over 90 Minutes Intravenous Every 24 hours 01/18/21 0517 01/19/21 1400   01/17/21 1330  Ampicillin-Sulbactam (UNASYN) 3  g in sodium chloride 0.9 % 100 mL IVPB  Status:  Discontinued        3 g 200 mL/hr over 30 Minutes Intravenous Every 8 hours 01/17/21 1239 01/19/21 1056   01/09/21 1100  piperacillin-tazobactam (ZOSYN) IVPB 3.375 g  Status:  Discontinued        3.375 g 12.5 mL/hr over 240 Minutes Intravenous Every 8 hours 01/09/21 1000 01/12/21 0835   01/09/21 0830  ceFEPIme (MAXIPIME) 2 g in sodium chloride 0.9 % 100 mL IVPB  Status:  Discontinued        2 g 200 mL/hr over 30 Minutes Intravenous Every 12 hours 01/09/21 0737 01/09/21 1000   01/08/21 1400  metroNIDAZOLE (FLAGYL) IVPB 500 mg  Status:  Discontinued        500 mg 100 mL/hr over 60 Minutes Intravenous Every 12 hours 01/08/21 1059 01/09/21 1000   01/08/21 1400  ceFEPIme (MAXIPIME) 2 g in sodium chloride 0.9 % 100 mL IVPB  Status:  Discontinued        2 g 200 mL/hr  over 30 Minutes Intravenous Every 24 hours 01/08/21 1114 01/09/21 0737   01/08/21 1000  vancomycin (VANCOREADY) IVPB 750 mg/150 mL  Status:  Discontinued        750 mg 150 mL/hr over 60 Minutes Intravenous Every 24 hours 01/07/21 2052 01/09/21 1000   01/08/21 0600  vancomycin (VANCOREADY) IVPB 1500 mg/300 mL  Status:  Discontinued        1,500 mg 150 mL/hr over 120 Minutes Intravenous Every 24 hours 01/07/21 0430 01/07/21 1210   01/08/21 0600  vancomycin (VANCOREADY) IVPB 750 mg/150 mL  Status:  Discontinued        750 mg 150 mL/hr over 60 Minutes Intravenous Every 24 hours 01/07/21 1210 01/07/21 2052   01/07/21 1400  piperacillin-tazobactam (ZOSYN) IVPB 3.375 g  Status:  Discontinued        3.375 g 12.5 mL/hr over 240 Minutes Intravenous Every 8 hours 01/07/21 0430 01/08/21 1114   01/07/21 0530  piperacillin-tazobactam (ZOSYN) IVPB 3.375 g        3.375 g 100 mL/hr over 30 Minutes Intravenous STAT 01/07/21 0430 01/07/21 0622   01/07/21 0530  vancomycin (VANCOREADY) IVPB 1750 mg/350 mL        1,750 mg 175 mL/hr over 120 Minutes Intravenous  Once 01/07/21 0430 01/07/21 0841   01/05/21 1430  Ampicillin-Sulbactam (UNASYN) 3 g in sodium chloride 0.9 % 100 mL IVPB        3 g 200 mL/hr over 30 Minutes Intravenous Every 6 hours 01/05/21 1334 01/06/21 1552   01/01/21 2200  piperacillin-tazobactam (ZOSYN) IVPB 3.375 g        3.375 g 12.5 mL/hr over 240 Minutes Intravenous Every 8 hours 01/01/21 1841 01/04/21 2359   01/01/21 2100  piperacillin-tazobactam (ZOSYN) IVPB 3.375 g  Status:  Discontinued        3.375 g 100 mL/hr over 30 Minutes Intravenous Every 8 hours 01/01/21 1836 01/01/21 1841   01/01/21 1345  piperacillin-tazobactam (ZOSYN) IVPB 3.375 g        3.375 g 100 mL/hr over 30 Minutes Intravenous  Once 01/01/21 1337 01/01/21 1430        Assessment/Plan POD 36, s/p exploratory laparotomy, ileocecectomy, drainage of intraabdominal abscess 01/01/21 Dr. Grandville Silos for perforated  appendicitis with abscess - Drain removed 10/24 - d/c vac 11/4. Daily wet to dry dressing changes to midline abdominal wound - Continue PT/OT, SNF. [facility] for discharge from surgical standpoint once dispo arranged -  on a diet and having bowel function but not eating much. Continue bowel regimen. Encourage PO intake   POD 30, s/p ex lap, splenectomy, application of incisional wound vac 10/14. Dr. Bobbye Morton for splenic laceration - CT 10/24 with complex LUQ fluid collection - s/p IR drain 10/25 yielding dark bloody fluid, culture with E coli resistant to unasyn - switched to Rocephin/ flagyl 10/27.  - post-splenectomy vaccines given 10/28 - CT scan 10/31 showed improving LUQ fluid collection with drain in place, no new acute findings  IR evaluated last week and recommended holding off on drain study until drainage more clear in character, planning follow up 10-14 days after discharge Awaiting bed at SNF, peer to peer for LTACH denied 11/2 Palliative team is following. "If further decline per family and patient transition to full comfort care."    FEN: TNA stopped 10/30, reg diet VTE: SQH  ID: Zosyn 10/9>10/20, unasyn 10/25>10/27, vancomycin 10/26>10/27 rocephin/flagyl 10/27>> ok to stopabx from a surgical standpoint   Below per primary team: hemorrhagic shock 2/2 above s/p 6 u PRBC, 2 u FFP 10/14 Septic shock 2/2 perforated appendicitis  EtOH abuse Alcohol related cardiomyopathy T2DM Chronic anemia Hx of TIA HTN HLD Gout Hx of GIB CAD Tobacco abuse L pleural effusion s/p thoracentesis 10/25  LOS: 36 days    Norm Parcel, [facility] 02/06/2021, 9:35 AM Please see Amion for pager number during day hours 7:00am-4:30pm

## 2021-02-06 NOTE — TOC Progression Note (Signed)
Transition of Care [facility]) - Progression Note    Patient Details  Name: DELMAN GOSHORN MRN: [id number] Date of Birth: 26-Jul-1955  [facility]) CM/SW [place], [place] Phone Number: 02/06/2021, 2:04 PM  Clinical Narrative:    CSW left multiple voicemail's and text messages for admissions with [facility] requesting an update on insurance authorization. CSW spoke with Ronalee Belts, in administration, who noted no one was on for admissions, but he will try to get someone from management to call. CSW called Ronalee Belts back an hour and a half later as no return call had been received, he stated he left a VM but hadn't heard back, he would keep trying. CSW will continue to follow for approval to discharge to the facility.   Expected Discharge Plan: Long Term Acute Care (LTAC) Barriers to Discharge: Continued Medical Work up, Orthoptist and Services Expected Discharge Plan: [place] (LTAC) In-house Referral: Clinical Social Work   Post Acute Care Choice: Long Term Acute Care (LTAC) Living arrangements for the past 2 months: Single Family Home Expected Discharge Date: 02/06/21                                     Social Determinants of Health (SDOH) Interventions    Readmission Risk Interventions No flowsheet data found.

## 2021-02-06 NOTE — Progress Notes (Signed)
Physical Therapy Treatment Patient Details Name: Travis [name] MRN: [id number] DOB: 1955-05-20 Today's Date: 02/06/2021   History of Present Illness Pt is a 65 y/o male admitted 10/9 with complaints of R lower quadrant abdominal pain. Found with intra-abdominal sepsis with perforated appendix and abdominal abscess; s/p diagnostic laparoscopy, ileocecectomy, drainage of interaabdominal abscess 10/9. Post op ileus. 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 Pt self extubated 10/16. L thoracentesis 10/25 with 600cc drained. PMHx: CHF, CKD, COPD, TIA, diabetes, HTN, gout, CAD.    PT Comments    Patient progrssing to hallway ambulation. C/o mild SOB using mask in hallway, but VSS throughout.  He is distracted by phone calls, RN in the room, but still participated for ambulation, but no further participation due to distractions.  Patient remains appropriate for SNF level rehab at d/c.  PT to continue to follow acutely.    Recommendations for follow up therapy are one component of a multi-disciplinary discharge planning process, led by the attending physician.  Recommendations may be updated based on patient status, additional functional criteria and insurance authorization.  Follow Up Recommendations  Skilled nursing-short term rehab (<3 hours/day)     Assistance Recommended at Discharge Frequent or constant Supervision/Assistance  Equipment Recommendations  Rolling walker (2 wheels)    Recommendations for Other Services       Precautions / Restrictions Precautions Precautions: Fall     Mobility  Bed Mobility               General bed mobility comments: in recliner upon my entry    Transfers Overall transfer level: Needs assistance Equipment used: Rolling walker (2 wheels) Transfers: Sit to/from Stand Sit to Stand: Min guard           General transfer comment: assist for balance, cues for safety with hand placement    Ambulation/Gait Ambulation/Gait  assistance: Min guard Gait Distance (Feet): 250 Feet Assistive device: Rolling walker (2 wheels) Gait Pattern/deviations: Step-through pattern;Decreased stride length;Trunk flexed       General Gait Details: cues for posture, slow pace, limited forward gaze   Stairs             Wheelchair Mobility    Modified Rankin (Stroke Patients Only)       Balance Overall balance assessment: Needs assistance Sitting-balance support: Feet supported Sitting balance-Leahy Scale: Good     Standing balance support: Bilateral upper extremity supported;Reliant on assistive device for balance Standing balance-Leahy Scale: Poor                              Cognition Arousal/Alertness: Awake/alert Behavior During Therapy: WFL for tasks assessed/performed Overall Cognitive Status: No family/caregiver present to determine baseline cognitive functioning Area of Impairment: Following commands;Safety/judgement;Memory                     Memory: Decreased short-term memory Following Commands: Follows multi-step commands consistently;Follows multi-step commands with increased time Safety/Judgement: Decreased awareness of deficits;Decreased awareness of safety              Exercises      General Comments General comments (skin integrity, edema, etc.): RN in the room delaying starting IV antibiotic to allow hallway ambulation,  started following ambulation      Pertinent Vitals/Pain Pain Assessment: No/denies pain    Home Living  Prior Function            PT Goals (current goals can now be found in the care plan section) Progress towards PT goals: Progressing toward goals    Frequency    Min 2X/week      PT Plan Current plan remains appropriate    Co-evaluation              AM-PAC PT "6 Clicks" Mobility   Outcome Measure  Help needed turning from your back to your side while in a flat bed without using  bedrails?: A Little Help needed moving from lying on your back to sitting on the side of a flat bed without using bedrails?: A Little Help needed moving to and from a bed to a chair (including a wheelchair)?: A Little Help needed standing up from a chair using your arms (e.g., wheelchair or bedside chair)?: A Little Help needed to walk in hospital room?: A Little Help needed climbing 3-5 steps with a railing? : A Lot 6 Click Score: 17    End of Session   Activity Tolerance: Patient tolerated treatment well Patient left: in chair;with call bell/phone within reach;with chair alarm set   PT Visit Diagnosis: Other abnormalities of gait and mobility (R26.89);Muscle weakness (generalized) (M62.81)     Time: 4967-5916 PT Time Calculation (min) (ACUTE ONLY): 17 min  Charges:  $Gait Training: 8-22 mins                     Magda Kiel, PT Acute Rehabilitation Services Pager:[phone] [phone] 02/06/2021    Reginia Naas 02/06/2021, 1:47 PM

## 2021-02-07 ENCOUNTER — Inpatient Hospital Stay (HOSPITAL_COMMUNITY): Payer: Medicare HMO

## 2021-02-07 DIAGNOSIS — E119 Type 2 diabetes mellitus without complications: Secondary | ICD-10-CM | POA: Diagnosis not present

## 2021-02-07 DIAGNOSIS — R188 Other ascites: Secondary | ICD-10-CM | POA: Diagnosis not present

## 2021-02-07 DIAGNOSIS — M25561 Pain in right knee: Secondary | ICD-10-CM

## 2021-02-07 DIAGNOSIS — E440 Moderate protein-calorie malnutrition: Secondary | ICD-10-CM | POA: Diagnosis not present

## 2021-02-07 DIAGNOSIS — M25562 Pain in left knee: Secondary | ICD-10-CM

## 2021-02-07 DIAGNOSIS — K3532 Acute appendicitis with perforation and localized peritonitis, without abscess: Secondary | ICD-10-CM | POA: Diagnosis not present

## 2021-02-07 LAB — BASIC METABOLIC PANEL
Anion gap: 11 (ref 5–15)
BUN: 20 mg/dL (ref 8–23)
CO2: 22 mmol/L (ref 22–32)
Calcium: 9.4 mg/dL (ref 8.9–10.3)
Chloride: 103 mmol/L (ref 98–111)
Creatinine, Ser: 1.03 mg/dL (ref 0.61–1.24)
GFR, Estimated: >60 mL/min (ref >60)
Glucose, Bld: 99 mg/dL (ref 70–99)
Potassium: 3.9 mmol/L (ref 3.5–5.1)
Sodium: 136 mmol/L (ref 135–145)

## 2021-02-07 LAB — GLUCOSE, CAPILLARY
Glucose-Capillary: 95 mg/dL (ref 70–99)
Glucose-Capillary: 108 mg/dL — ABNORMAL HIGH (ref 70–99)
Glucose-Capillary: 122 mg/dL — ABNORMAL HIGH (ref 70–99)
Glucose-Capillary: 111 mg/dL — ABNORMAL HIGH (ref 70–99)

## 2021-02-07 MED ORDER — ENSURE ENLIVE PO LIQD
237.0000 mL | ORAL | Status: DC
  Administered 2021-02-08: 237 mL via ORAL

## 2021-02-07 MED ORDER — VANCOMYCIN HCL 1750 MG/350ML IV SOLN: 1750.0000 mg | INTRAVENOUS | Status: DC

## 2021-02-07 MED ORDER — PROSOURCE PLUS PO LIQD
30.0000 mL | Freq: Every day | ORAL | Status: DC
  Administered 2021-02-08 – 2021-02-09 (×2): 30 mL via ORAL
  Filled 2021-02-07 (×2): qty 30

## 2021-02-07 MED ORDER — PIPERACILLIN-TAZOBACTAM 3.375 G IVPB 30 MIN
3.3750 g | Freq: Once | INTRAVENOUS | Status: AC
  Administered 2021-02-07: 3.375 g via INTRAVENOUS
  Filled 2021-02-07: qty 50

## 2021-02-07 MED ORDER — PIPERACILLIN-TAZOBACTAM 3.375 G IVPB
3.3750 g | Freq: Three times a day (TID) | INTRAVENOUS | Status: DC
  Administered 2021-02-07: 3.375 g via INTRAVENOUS
  Filled 2021-02-07: qty 50

## 2021-02-07 MED ORDER — BOOST / RESOURCE BREEZE PO LIQD CUSTOM
1.0000 | ORAL | Status: DC
  Administered 2021-02-07 – 2021-02-08 (×2): 1 via ORAL

## 2021-02-07 MED ORDER — VANCOMYCIN HCL 1750 MG/350ML IV SOLN
1750.0000 mg | Freq: Once | INTRAVENOUS | Status: AC
  Administered 2021-02-07: 1750 mg via INTRAVENOUS
  Filled 2021-02-07: qty 350

## 2021-02-07 NOTE — Progress Notes (Signed)
OT Cancellation Note  Patient Details Name: [name] MRN: [id number] DOB: [birth year]   Cancelled Treatment:    Reason Eval/Treat Not Completed: Other (comment)- pt with PICC line removal and must maintain supine for 30 minutes.  OT will follow up and see as able.   Travis [name], OT Acute Rehabilitation Services Pager [phone] Office [phone]   Delight Stare 02/07/2021, 12:14 PM

## 2021-02-07 NOTE — Progress Notes (Signed)
Pharmacy Antibiotic Note  Travis [name] is a 65 y.o. male admitted on 01/01/2021 with sepsis d/t acute appendicitis w/ rupture, on ceftriaxone and metronidazole 10/27 >> 11/14, now spiking fevers with tachycardia.  Pharmacy has been consulted for vancomycin and Zosyn dosing.  Plan: Vancomycin 1750mg  IV Q24H. Goal AUC 400-550.  Expected AUC 470.  SCr 1.1.  Zosyn 3.375g IV q8h (4 hour infusion).  Height: 6' (182.9 cm) Weight: 79.5 kg (175 lb 4.3 oz) IBW/kg (Calculated) : 77.6  Temp (24hrs), Avg:98.7 F (37.1 C), Min:98.1 F (36.7 C), Max:100.2 F (37.9 C)  No Known Allergies   Thank you for allowing pharmacy to be a part of this patient's care.  Wynona Neat, PharmD, BCPS  02/07/2021 1:04 AM

## 2021-02-07 NOTE — Progress Notes (Signed)
  X-cover Note: Received call from bedside RN. Pt with fever. temp 101.1 and HR 102, bp 114/75  Blood cx X 2 sent. Zosyn and vanco started.  Kristopher Oppenheim, DO [facility]

## 2021-02-07 NOTE — Progress Notes (Signed)
Lower extremity venous bilateral study completed.   Please see CV Proc for preliminary results.   Sequan Auxier, RDMS, RVT  

## 2021-02-07 NOTE — Progress Notes (Signed)
PROGRESS NOTE        PATIENT DETAILS Name: Travis [name] Age: 65 y.o. Sex: male Date of Birth: [birth year] Admit Date: 01/01/2021 Admitting Physician Georganna Skeans, MD [id number], Alyson Locket, NP  Brief Narrative:  Patient is a 65 y.o. male with history of HFrEF, CKD stage III, COPD, HTN, DM-2, HLD, nonobstructive CAD-who presented with abdominal pain-found to have sepsis from ruptured appendicitis-underwent exploratory laparotomy/ileocecectomy and drainage of intra-abdominal abscess.Patient was briefly managed in the ICU-unfortunately-on 10/14 he developed severe abdominal pain -he was then found to have hemorrhagic shock due to a spontaneous splenic rupture-he was reevaluated by general surgery and underwent a splenectomy.  He was readmitted to the ICU-remained on the ventilator-he self extubated on 10/16-he was monitored closely-upon further stability-transfer to the [facility] hospitalist service on 10/20.  Significant events: 10/09>>admit- ex lap, remained intubated post op-to ICU 10/10>>extubated 10/12>> transferred to [facility] 10/14>>hypotensive/abd pain-CT showed spleenc rupture-to OR for splenectomy. 10/16>> Self-extubated  10/18>> Increased O2 requirement to 8L 10/19>> O2 weaned to 4L 10/20>> transferred to [facility]    Radiology/Echo 10/09>> CT abdomen: Acute appendicitis with perforation 10/14>> CT abdomen: Splenic rupture with large splenic hematoma 10/15>> TTE: EF 45-50% 10/20>> CT abdomen/pelvis: Indeterminate 2.4 cm fluid collection in the right lower quadrant adjacent to the cecal resection site.  Moderate left pleural effusion with compressive atelectasis. 10/24 >> CT abdomen/pelvis: complex fluid collections in the left upper quadrant. The largest collection there is 162 mL.  10/27 >> CT abdomen/pelvis: Placement of pigtail catheter in the left upper quadrant fluid collection. 10/31>> CT abdomen/pelvis: Interval decrease size of the dominant fluid collection  in the left upper quadrant. 11/15>> CT abdomen/pelvis: Previously noted fluid collection has resolved-no loculated intra-abdominal fluid collection identified. 11/15>> CXR: Interval improvement in aeration of mid/lower lung-suggesting resolving atelectasis/pneumonia.  Subjective:  Developed a fever overnight.  Denies any abdominal pain.  Had 1 episode of loose stools today.  No cough.   Objective:  Vitals: Blood pressure 115/72, pulse 88, temperature 98.4 F (36.9 C), temperature source Axillary, resp. rate 17, height 6' (1.829 m), weight 79.5 kg, SpO2 92 %.   Exam: Gen Exam:Alert awake-not in any distress HEENT:atraumatic, normocephalic Chest: B/L clear to auscultation anteriorly CVS:S1S2 regular Abdomen:soft non tender, non distended Extremities:no edema Neurology: Non focal Skin: no rash   Assessment/Plan:   Sepsis due to acute appendicitis with perforation/intra-abdominal abscesses and Bacteroides bacteremia (s/p laparotomy/ileocecectomy and drainage of intra-abdominal abscess on 10/9): Sepsis physiology has resolved-postoperatively-developed left upper quadrant fluid collection-and is s/p drain placement by IR.   Rocephin/Flagyl (fluid cultures growing E. coli)-was discontinued on 11/14 after discussion with general surgery.  Unfortunately he developed fever earlier this morning-no clear-cut foci of infection apparent-CT abdomen with improvement in the fluid collection.  Will remove PICC line-he did have 1 episode of loose stools but none since then-chest x-ray shows no obvious pneumonia-we will stop Vanco/Zosyn that was started last night-and watch closely.  If he continues to have persistent fever-May need more imaging or ID evaluation.  Staff epidermidis bacteremia: Likely a contaminant and of no clinical significance.    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    Acute hypoxic respiratory failure due to atelectasis/pleural effusion: Self extubated on 10/16-stable on anywhere from 2-4 L of oxygen-did develop some transient shortness of  breath on 10/20-which responded to bronchodilators/diuretics.    Patient appears to have a moderate-sized left-sided pleural effusion which is likely transudative from-HFrEF/reactive to splenectomy.  Patient underwent ultrasound-guided  thoracentesis on 01/17/2021 which was consistent with sympathetic exudative effusion gram stain negative cultures negative   Acute metabolic encephalopathy: Rapidly improving-continue supportive care.   AKI on CKD stage IIIa: AKI hemodynamically mediated-hold Lasix, post hydration and packed RBC transfusion, improved.   HTN: BP stable-continue Coreg-losartan remains on hold   Nonischemic cardiomyopathy/HFrEF: Not in exacerbation-continue Lasix and monitor volume status closely.  Follow electrolytes.    Nonobstructive CAD: No anginal symptoms-already on beta-blocker-we will resume aspirin/statin over the next few days when more stable.   Hypomagnesemia: repleted   Multifactorial anemia: Due to combination of splenic rupture, acute illness-has been transfused numerous units of PRBC-hemoglobin now stable.  Follow periodically.     HLD: Resume statin/fenofibrate when oral intake was stable.   COPD: Stable-continue bronchodilators   Functional quadriplegia/debility/deconditioning: Due to acute illness-continue PT/OT eval-either SNF or LTAC on discharge.   DM-2 (A1c 6.4 on 06/02/2020): CBGs stable-continue SSI   Failure to thrive syndrome: slowly improving-continue to encourage oral intake-continue nutritional supplements.    Nutrition Status: Nutrition Problem: Moderate Malnutrition Etiology: acute illness, chronic illness (perforated appendix, CAD, cardiomyopathy) Signs/Symptoms: mild muscle depletion, mild fat depletion Interventions: Ensure Enlive (each supplement provides 350kcal  and 20 grams of protein), MVI   BMI: Estimated body mass index is 24.28 kg/m as calculated from the following:   Height as of this encounter: 6' (1.829 m).   Weight as of this encounter: 81.2 kg.   Nutrition Status: Nutrition Problem: Moderate Malnutrition Etiology: acute illness, chronic illness (perforated appendix, CAD, cardiomyopathy) Signs/Symptoms: mild muscle depletion, mild fat depletion Interventions: MVI, Ensure Enlive (each supplement provides 350kcal and 20 grams of protein), Prostat  BMI: Estimated body mass index is 23.77 kg/m as calculated from the following:   Height as of this encounter: 6' (1.829 m).   Weight as of this encounter: 79.5 kg.    Procedures: 10/9>>exploratory laparotomy, ileocecectomy, drainage of intraabdominal abscess  10/14>> exploratory laparotomy and splenectomy 10/14>> left subclavian triple-lumen catheter 01/17/2021  >> left ultrasound-guided thoracentesis 600 cc exudative fluid, gram stain -ve 01/17/2021  >> LUQ JP drain by IR   Consults: General surgery, PCCM, IR DVT Prophylaxis: Heparin Code Status:DNR Family Communication: discussed with niece by phone 11/5.  Discussed with sister in law at bedside today    Diet: Diet Order             Diet general           Diet regular Room service appropriate? Yes; Fluid consistency: Thin  Diet effective now                    Disposition Plan: Status is: Inpatient  Insurance has denied LTAC placement, so plan to discharge to subacute rehab  Barriers to Discharge: Waiting safe disposition plan  MEDICATIONS: Scheduled Meds:  (feeding supplement) PROSource Plus  30 mL Oral BID BM   acetaminophen  1,000 mg Oral Q6H   bisacodyl  10 mg Rectal Once   Chlorhexidine Gluconate Cloth  6 each Topical Daily   docusate sodium  100 mg Oral BID   enoxaparin (LOVENOX) injection  40 mg Subcutaneous Q24H   feeding supplement  237 mL Oral BID BM    HYDROmorphone (DILAUDID) injection  0.5 mg  Intravenous Once   insulin aspart  0-9 Units Subcutaneous TID WC   mouth  rinse  15 mL Mouth Rinse BID   methocarbamol  500 mg Oral QID   metoprolol tartrate  50 mg Oral BID   mirtazapine  30 mg Oral QHS   multivitamin with minerals  1 tablet Oral Daily   oxyCODONE  20 mg Oral Q12H   pantoprazole  40 mg Oral Daily   Continuous Infusions:  sodium chloride 10 mL/hr at 01/29/21 0610   PRN Meds:.sodium chloride, bisacodyl, dextromethorphan, HYDROmorphone (DILAUDID) injection, hydroxypropyl methylcellulose / hypromellose, ipratropium-albuterol, ondansetron (ZOFRAN) IV, oxyCODONE, phenol, simethicone, sodium chloride, sodium chloride flush   I have personally reviewed following labs and imaging studies  LABORATORY DATA:  No results for input(s): WBC, HGB, HCT, PLT, MCV, MCH, MCHC, RDW, LYMPHSABS, MONOABS, EOSABS, BASOSABS, BANDABS in the last 168 hours.  Invalid input(s): NEUTRABS, BANDSABD   Recent Labs  Lab 02/07/21 0245  NA 136  K 3.9  CL 103  CO2 22  GLUCOSE 99  BUN 20  CREATININE 1.03  CALCIUM 9.4      RADIOLOGY STUDIES/RESULTS: CT ABDOMEN PELVIS WO CONTRAST  Result Date: 02/07/2021 CLINICAL DATA:  Left upper quadrant abscess status post percutaneous drainage. Decreased drain output. EXAM: CT ABDOMEN AND PELVIS WITHOUT CONTRAST TECHNIQUE: Multidetector CT imaging of the abdomen and pelvis was performed following the standard protocol without IV contrast. COMPARISON:  None. FINDINGS: Lower chest: Small left pleural effusion is unchanged. Bibasilar atelectasis with subtotal collapse of the left lower lobe is again noted. Central venous catheter tip noted within the right atrium. Cardiac size within normal limits. Moderate coronary artery calcification. Hepatobiliary: Cholelithiasis without superimposed pericholecystic inflammatory change. Liver unremarkable. No intra or extrahepatic biliary ductal dilation. Pancreas: Unremarkable Spleen: Status post splenectomy. Pigtail  drainage catheter is seen within the splenectomy bed. Pericatheter fluid collection noted on prior examination has been completely evacuated. Small amount of residual pericatheter soft tissue in keeping with inflammatory tissue is noted. Adrenals/Urinary Tract: Adrenal glands are unremarkable. Kidneys are normal, without renal calculi, focal lesion, or hydronephrosis. Bladder is unremarkable. Stomach/Bowel: Status post appendectomy. Omental infiltration within the left upper quadrant and right lower quadrant is stable since prior examination, most in keeping with postsurgical change. No discrete drainable fluid collection identified within the abdomen and pelvis. The rectal vault is fluid-filled, a finding that can present clinically as diarrhea. The stomach, small bowel, and large bowel are otherwise unremarkable. No evidence of obstruction. No free intraperitoneal gas or fluid. Vascular/Lymphatic: Aortic atherosclerosis. No enlarged abdominal or pelvic lymph nodes. Reproductive: Obscured by streak artifact from right hip arthroplasty. Other: Tiny fat containing umbilical hernia noted. Musculoskeletal: Status post right total hip arthroplasty. Degenerative changes are seen within the lumbar spine. No acute bone abnormality. IMPRESSION: Left upper quadrant percutaneous drainage catheter is unchanged in position. Previously noted pericatheter fluid collection has now completely resolved with a small amount of a pericatheter inflammatory tissue remaining. No loculated intra-abdominal fluid collection identified. Small left pleural effusion, likely reactive. Bibasilar atelectasis, left greater than right. Moderate coronary artery calcification. Cholelithiasis. Infiltration of the omentum, likely postoperative in nature related to splenectomy and appendectomy. Aortic Atherosclerosis (ICD10-I70.0). Electronically Signed   By: Fidela Salisbury M.D.   On: 02/07/2021 01:26   DG Chest Port 1V same Day  Result Date:  02/07/2021 CLINICAL DATA:  Shortness of breath EXAM: PORTABLE CHEST 1 VIEW COMPARISON:  01/21/2021 FINDINGS: Transverse diameter of heart is slightly increased. Thoracic aorta is tortuous and ectatic. Tip of PICC line is seen in the right atrium. There are no signs of alveolar pulmonary  edema. There is improvement in aeration of left parahilar region and both lower lung fields. Residual patchy infiltrates are seen in the left lower lung fields. Linear density in the right parahilar region may suggest fluid in the interlobar fissure or subsegmental atelectasis. Left chest tube is noted. There is no pneumothorax. IMPRESSION: There is interval improvement in aeration of mid and lower lung fields suggesting resolving atelectasis/pneumonia. Small left pleural effusion. Electronically Signed   By: Elmer Picker M.D.   On: 02/07/2021 11:51     LOS: 37 days   Signature -  Oren Binet M.D on 02/07/2021 at 12:33 PM   -  To page go to [email or web address]

## 2021-02-07 NOTE — Progress Notes (Signed)
   02/07/21 0000  Assess: MEWS Score  Temp (!) 101.1 F (38.4 C)  BP 114/75  Pulse Rate (!) 102  Resp 16  SpO2 92 %  Assess: MEWS Score  MEWS Temp 1  MEWS Systolic 0  MEWS Pulse 1  MEWS RR 0  MEWS LOC 0  MEWS Score 2  MEWS Score Color Yellow  Assess: if the MEWS score is Yellow or Red  Were vital signs taken at a resting state? Yes  Focused Assessment No change from prior assessment  Early Detection of Sepsis Score *See Row Information* Low  MEWS guidelines implemented *See Row Information* Yes  Treat  MEWS Interventions Administered scheduled meds/treatments;Administered prn meds/treatments;Escalated (See documentation below)  Pain Score 0  Take Vital Signs  Increase Vital Sign Frequency  Yellow: Q 2hr X 2 then Q 4hr X 2, if remains yellow, continue Q 4hrs  Escalate  MEWS: Escalate Yellow: discuss with charge nurse/RN and consider discussing with provider and RRT  Notify: Charge Nurse/RN  Name of Charge Nurse/RN Notified Gerald Stabs RN  Date Charge Nurse/RN Notified 02/07/21  Time Charge Nurse/RN Notified 0037  Notify: Provider  Provider Name/Title Kristopher Oppenheim  Date Provider Notified 02/07/21  Time Provider Notified 0041  Notification Type Page  Notification Reason Critical result  Provider response See new orders  Date of Provider Response 02/07/21  Time of Provider Response [phone]  Document  Patient Outcome Stabilized after interventions  Progress note created (see row info) Yes

## 2021-02-07 NOTE — TOC Progression Note (Addendum)
Transition of Care [facility]) - Progression Note    Patient Details  Name: Travis [name] MRN: [id number] Date of Birth: October 07, 1955  Transition of Care [facility]) CM/SW [place], [place] Phone Number: 02/07/2021, 3:18 PM  Clinical Narrative:     CSW spoke with Jackelyn Poling from [facility] Debbie confirmed insurance authorization has been approved for five days.Start date 11/15-11/19.CSW following patient for SNF placement when medically ready for dc. CSW will continue to follow and assist with patients dc planning needs.  Expected Discharge Plan: Long Term Acute Care (LTAC) Barriers to Discharge: Continued Medical Work up, Orthoptist and Services Expected Discharge Plan: [place] (LTAC) In-house Referral: Clinical Social Work   Post Acute Care Choice: Long Term Acute Care (LTAC) Living arrangements for the past 2 months: Single Family Home Expected Discharge Date: 02/06/21                                     Social Determinants of Health (SDOH) Interventions    Readmission Risk Interventions No flowsheet data found.

## 2021-02-07 NOTE — Progress Notes (Signed)
Nutrition Follow-up  DOCUMENTATION CODES:   Non-severe (moderate) malnutrition in context of chronic illness  INTERVENTION:   Decrease Ensure Enlive to Daily, each supplement provides 350 kcal and 20 grams of protein  Decrease 30 mL ProSource Plus to Daily, each supplement provides 100 kcal and 15 grams of protein  Boost Breeze po Daily, each supplement provides 250 kcal and 9 grams of protein  Recommend Cortrak placement to meet pt needs if within Goals of Care.  Continue Multivitamin w/ minerals daily  Encourage good PO intake  NUTRITION DIAGNOSIS:   Moderate Malnutrition related to acute illness, chronic illness (perforated appendix, CAD, cardiomyopathy) as evidenced by mild muscle depletion, mild fat depletion. - Ongoing   GOAL:   Patient will meet greater than or equal to 90% of their needs - Progressing   MONITOR:   PO intake, Supplement acceptance, Weight trends, Skin  REASON FOR ASSESSMENT:   Consult Assessment of nutrition requirement/status  ASSESSMENT:   65 yo male admitted with perforated appendix and abdominal abscess. S/P ex lap, ileocecectomy, drainage of intra-abdominal abscess. PMH includes CHF, CKD 3A, COPD, TIA, DM, HTN, gout, HLD, CAD, alcohol abuse, tobacco abuse.  10/9- s/p ex lap, ileocectomy, drainage of intra-abdominal abscess 10/11- s/p ex lap, splenectomy, and incisional wound vac placement 10/16- extubated, TPN initiated 10/20- advanced to clear liquid diet, NGT d/c 10/23- advanced to full liquid diet  10/25- advanced to Heart Health/Carb Modified 10/28- Regular diet 10/30- TPN discontinued  11/15- PICC line removed  Pt reports that he has not had a big appetite; states that he has been nibbling on soup and crackers here and there. Pt report that he has been drinking 1 ProSource in the morning and 1 Ensure at night; states that they make him have  diarrhea. Pt reports that his niece has not brought him any food since last week.  Per EMR,  pt intake includes: 11/09: Dinner 0% 11/10: Dinner 10% 11/13: Breakfast 0% 11/15: Breakfast 25%  Spoke with RN outside of room due to pt poor PO intake and limited supplement intake. RN reports that pt was refusing supplements last week when she was with him but this week he has been agreeable to more and that he stated he knows that he needs more protein. Discussed with RN about trying a Linnell Fulling, RN willing to try with pt.   PLDN recommended a Cortrak placement on 11/03 and MD requested holding off. If PO intake continues to be poor, would consider Cortrak until pt is able to meet needs. Messaged MD with recommendation for Cortrak.  CM working on pt discharge.  Medications reviewed and include: Dulcolax, Colace, Hydromorphone, SSI 0-9 units TID, Metronidazole, Robaxin, Remeron, MVI, Oxycodone, Protonix, Miralax, IV antibiotics Labs reviewed: 24 hr BG trends: 82-142  Admission Weight: 86.8 kg Current Weight: 79.5 kg   Diet Order:   Diet Order             Diet general           Diet regular Room service appropriate? Yes; Fluid consistency: Thin  Diet effective now                   EDUCATION NEEDS:   No education needs have been identified at this time  Skin:  Skin Assessment: Skin Integrity Issues: Skin Integrity Issues:: Wound VAC Wound Vac: abdomen  Last BM:  02/04/2021  Height:   Ht Readings from Last 1 Encounters:  01/01/21 6' (1.829 m)    Weight:  Wt Readings from Last 1 Encounters:  02/06/21 79.5 kg    Ideal Body Weight:  80.9 kg  BMI:  Body mass index is 23.77 kg/m.  Estimated Nutritional Needs:   Kcal:  2400-2600  Protein:  125-150 grams  Fluid:  > 2 L    Calton Harshfield BS, PLDN Clinical Dietitian See AMiON for contact information.

## 2021-02-07 NOTE — Plan of Care (Signed)
Pt currently lying in bed, denies need for pain medication at this time, cxr and [place] ble completed today, pt has been up to chair, remained afrebile during am shift, no other concerns noted, call bell within reach, sbar to be provided to on coming nurse.  Problem: Clinical Measurements: Goal: Ability to maintain clinical measurements within normal limits will improve Outcome: Progressing Goal: Will remain free from infection Outcome: Progressing

## 2021-02-07 NOTE — Progress Notes (Signed)
Patient ans his RN were informed about procedure for PICC line removal by this RN, prior to proceeding with removal. HOB was lowered and patient was placed in supine position and instructed that he must remain in bed with HOB flat for 30 minutes following line removal. Patient and his RN and nurse tech were informed the left arm remains restricted for 24 hours following line removal.  Prior to removal the dressing was noted to be clean,dry, and intact. Site was free from drainage, redness,swelling. Patient denies tenderness at the site. Patient was instructed to hold his breath during line removal. Pressure was held for 6 minutes with Vaseline gauze and 2x2 gauze in place. Then site was covered with Tegaderm occlusive dressing and dressing was dated and signed by this RN. Catheter tip was intact and length was 50cm. A small brown clot was noted to be protruding from the end of the catheter after removal.Patient and his RN were instrcted to monitor for bleeding.swelling and patient is to report any concerns.

## 2021-02-08 ENCOUNTER — Inpatient Hospital Stay (HOSPITAL_COMMUNITY): Payer: Medicare HMO

## 2021-02-08 DIAGNOSIS — K567 Ileus, unspecified: Secondary | ICD-10-CM

## 2021-02-08 DIAGNOSIS — M25561 Pain in right knee: Secondary | ICD-10-CM

## 2021-02-08 DIAGNOSIS — K9189 Other postprocedural complications and disorders of digestive system: Secondary | ICD-10-CM

## 2021-02-08 DIAGNOSIS — K3532 Acute appendicitis with perforation and localized peritonitis, without abscess: Secondary | ICD-10-CM | POA: Diagnosis not present

## 2021-02-08 DIAGNOSIS — M25562 Pain in left knee: Secondary | ICD-10-CM

## 2021-02-08 LAB — COMPREHENSIVE METABOLIC PANEL
ALT: 13 U/L (ref 0–44)
AST: 21 U/L (ref 15–41)
Albumin: 2.2 g/dL — ABNORMAL LOW (ref 3.5–5.0)
Alkaline Phosphatase: 71 U/L (ref 38–126)
Anion gap: 8 (ref 5–15)
BUN: 16 mg/dL (ref 8–23)
CO2: 22 mmol/L (ref 22–32)
Calcium: 9.3 mg/dL (ref 8.9–10.3)
Chloride: 107 mmol/L (ref 98–111)
Creatinine, Ser: 1.08 mg/dL (ref 0.61–1.24)
GFR, Estimated: >60 mL/min (ref >60)
Glucose, Bld: 97 mg/dL (ref 70–99)
Potassium: 4 mmol/L (ref 3.5–5.1)
Sodium: 137 mmol/L (ref 135–145)
Total Bilirubin: 0.6 mg/dL (ref 0.3–1.2)
Total Protein: 6.9 g/dL (ref 6.5–8.1)

## 2021-02-08 LAB — RESP PANEL BY RT-PCR (FLU A&B, COVID) ARPGX2
Influenza A by PCR: NEGATIVE
Influenza B by PCR: NEGATIVE
SARS Coronavirus 2 by RT PCR: NEGATIVE

## 2021-02-08 LAB — URINALYSIS, ROUTINE W REFLEX MICROSCOPIC
Glucose, UA: NEGATIVE mg/dL
Hgb urine dipstick: NEGATIVE
Ketones, ur: NEGATIVE mg/dL
Leukocytes,Ua: NEGATIVE
Nitrite: NEGATIVE
Protein, ur: NEGATIVE mg/dL
Specific Gravity, Urine: 1.03 (ref 1.005–1.030)
pH: 5 (ref 5.0–8.0)

## 2021-02-08 LAB — CBC
HCT: 28.2 % — ABNORMAL LOW (ref 39.0–52.0)
Hemoglobin: 8.6 g/dL — ABNORMAL LOW (ref 13.0–17.0)
MCH: 28 pg (ref 26.0–34.0)
MCHC: 30.5 g/dL (ref 30.0–36.0)
MCV: 91.9 fL (ref 80.0–100.0)
Platelets: 521 10*3/uL — ABNORMAL HIGH (ref 150–400)
RBC: 3.07 MIL/uL — ABNORMAL LOW (ref 4.22–5.81)
RDW: 17.3 % — ABNORMAL HIGH (ref 11.5–15.5)
WBC: 8.2 10*3/uL (ref 4.0–10.5)
nRBC: 0.5 % — ABNORMAL HIGH (ref 0.0–0.2)

## 2021-02-08 LAB — GLUCOSE, CAPILLARY
Glucose-Capillary: 80 mg/dL (ref 70–99)
Glucose-Capillary: 111 mg/dL — ABNORMAL HIGH (ref 70–99)
Glucose-Capillary: 139 mg/dL — ABNORMAL HIGH (ref 70–99)
Glucose-Capillary: 130 mg/dL — ABNORMAL HIGH (ref 70–99)

## 2021-02-08 LAB — C-REACTIVE PROTEIN: CRP: 8.1 mg/dL — ABNORMAL HIGH (ref <1.0)

## 2021-02-08 LAB — SEDIMENTATION RATE: Sed Rate: 134 mm/hr — ABNORMAL HIGH (ref 0–16)

## 2021-02-08 MED ORDER — APIXABAN 5 MG PO TABS: 5.0000 mg | ORAL_TABLET | Freq: Two times a day (BID) | ORAL | Status: DC

## 2021-02-08 MED ORDER — APIXABAN 5 MG PO TABS
10.0000 mg | ORAL_TABLET | Freq: Two times a day (BID) | ORAL | Status: DC
  Administered 2021-02-08 – 2021-02-09 (×3): 10 mg via ORAL
  Filled 2021-02-08 (×3): qty 2

## 2021-02-08 NOTE — Progress Notes (Signed)
Upper extremity venous bilateral study completed.  Preliminary results relayed to Sloan Leiter, MD via secure chat.  See CV Proc for preliminary results report.   Darlin Coco, RDMS, RVT

## 2021-02-08 NOTE — Progress Notes (Signed)
PROGRESS NOTE        PATIENT DETAILS Name: Travis [name] Age: 65 y.o. Sex: male Date of Birth: March 26, 1956 Admit Date: 01/01/2021 Admitting Physician Georganna Skeans, MD TIR:WERXV, Alyson Locket, NP  Brief Narrative:  Patient is a 65 y.o. male with history of HFrEF, CKD stage III, COPD, HTN, DM-2, HLD, nonobstructive CAD-who presented with abdominal pain-found to have sepsis from ruptured appendicitis-underwent exploratory laparotomy/ileocecectomy and drainage of intra-abdominal abscess.Patient was briefly managed in the ICU-unfortunately-on 10/14 he developed severe abdominal pain -he was then found to have hemorrhagic shock due to a spontaneous splenic rupture-he was reevaluated by general surgery and underwent a splenectomy.  He was readmitted to the ICU-remained on the ventilator-he self extubated on 10/16-he was monitored closely-upon further stability-transfer to the [facility] hospital[facility] service on 10/20.  Significant events: 10/09>>admit- ex lap, remained intubated post op-to ICU 10/10>>extubated 10/12>> transferred to [facility] 10/14>>hypotensive/abd pain-CT showed spleenc rupture-to OR for splenectomy. 10/16>> Self-extubated  10/18>> Increased O2 requirement to 8L 10/19>> O2 weaned to 4L 10/20>> transferred to [facility]    Radiology/Echo 10/09>> CT abdomen: Acute appendicitis with perforation 10/14>> CT abdomen: Splenic rupture with large splenic hematoma 10/15>> TTE: EF 45-50% 10/20>> CT abdomen/pelvis: Indeterminate 2.4 cm fluid collection in the right lower quadrant adjacent to the cecal resection site.  Moderate left pleural effusion with compressive atelectasis. 10/24 >> CT abdomen/pelvis: complex fluid collections in the left upper quadrant. The largest collection there is 162 mL.  10/27 >> CT abdomen/pelvis: Placement of pigtail catheter in the left upper quadrant fluid collection. 10/31>> CT abdomen/pelvis: Interval decrease size of the dominant fluid collection  in the left upper quadrant. 11/15>> CT abdomen/pelvis: Previously noted fluid collection has resolved-no loculated intra-abdominal fluid collection identified. 11/15>> CXR: Interval improvement in aeration of mid/lower lung-suggesting resolving atelectasis/pneumonia. 11/15>> bilateral lower extremity Doppler: No DVT. 11/16>> bilateral upper extremity Doppler: Acute DVT in the left brachial vein.  Subjective:  Afebrile overnight.  No diarrhea.   Objective:  Vitals: Blood pressure 117/79, pulse 88, temperature 98.3 F (36.8 C), temperature source Oral, resp. rate 17, height 6' (1.829 m), weight 78.7 kg, SpO2 99 %.   Exam: Gen Exam:Alert awake-not in any distress HEENT:atraumatic, normocephalic Chest: B/L clear to auscultation anteriorly CVS:S1S2 regular Abdomen:soft non tender, non distended Extremities:no edema Neurology: Non focal Skin: no rash   Assessment/Plan:   Sepsis due to acute appendicitis with perforation/intra-abdominal abscesses and Bacteroides bacteremia (s/p laparotomy/ileocecectomy and drainage of intra-abdominal abscess on 10/9): Sepsis physiology has resolved-postoperatively he developed a left upper quadrant fluid collection-requiring drain placement by IR.  Fluid cultures grew E. coli.  Rocephin/Flagyl was discontinued on 11/14.  Repeat CT abdomen showed resolution of the fluid collection in the left upper quadrant.  Patient developed a fever on 11/15-subsequently underwent work-up-which showed a possible small DVT in the left brachial vein-this was likely felt to be the cause of fever.  He will be started on anticoagulation.  He was briefly evaluated by ID as well.  General surgery and IR followed closely throughout this hospital stay.  Reached out to the IR team today-they plan on removing the left upper quadrant drain today.  Plan is to watch closely-if he is afebrile overnight-likely can be discharged to SNF tomorrow morning.  Left upper extremity DVT: See  above-starting Eliquis on 11/16.  This is provoked-and probably related to recent PICC line  Staff  epidermidis bacteremia: Likely a contaminant and of no clinical significance.    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   Acute hypoxic respiratory failure due to atelectasis/pleural effusion: Self extubated on 10/16-stable on anywhere from 2-4 L of oxygen-did develop some transient shortness of breath on 10/20-which responded to bronchodilators/diuretics.    Patient appears to have a moderate-sized left-sided pleural effusion which is likely transudative from-HFrEF/reactive to splenectomy.  Patient underwent ultrasound-guided  thoracentesis on 01/17/2021 which was consistent with sympathetic exudative effusion gram stain negative cultures negative   Acute metabolic encephalopathy: Rapidly improving-continue supportive care.   AKI on CKD stage IIIa: AKI hemodynamically mediated-hold Lasix, post hydration and packed RBC transfusion, improved.   HTN: BP stable-continue Coreg-losartan remains on hold   Nonischemic cardiomyopathy/HFrEF: Not in exacerbation-continue Lasix and monitor volume status closely.  Follow electrolytes.    Nonobstructive CAD: No anginal symptoms-already on beta-blocker-we will resume aspirin/statin over the next few days when more stable.   Hypomagnesemia: repleted   Multifactorial anemia: Due to combination of splenic rupture, acute illness-has been transfused numerous units of PRBC-hemoglobin now stable.  Follow periodically.     HLD: Resume statin/fenofibrate when oral intake was stable.   COPD: Stable-continue bronchodilators   Functional quadriplegia/debility/deconditioning: Due to acute illness-continue PT/OT eval-either SNF or LTAC on discharge.   DM-2 (A1c 6.4 on 06/02/2020): CBGs stable-continue SSI   Failure to thrive syndrome: slowly  improving-continue to encourage oral intake-continue nutritional supplements.    Nutrition Status: Nutrition Problem: Moderate Malnutrition Etiology: acute illness, chronic illness (perforated appendix, CAD, cardiomyopathy) Signs/Symptoms: mild muscle depletion, mild fat depletion Interventions: Ensure Enlive (each supplement provides 350kcal and 20 grams of protein), MVI   BMI: Estimated body mass index is 24.28 kg/m as calculated from the following:   Height as of this encounter: 6' (1.829 m).   Weight as of this encounter: 81.2 kg.   Nutrition Status: Nutrition Problem: Moderate Malnutrition Etiology: acute illness, chronic illness (perforated appendix, CAD, cardiomyopathy) Signs/Symptoms: mild muscle depletion, mild fat depletion Interventions: MVI, Ensure Enlive (each supplement provides 350kcal and 20 grams of protein), Prostat, Boost Breeze, Refer to RD note for recommendations  BMI: Estimated body mass index is 23.53 kg/m as calculated from the following:   Height as of this encounter: 6' (1.829 m).   Weight as of this encounter: 78.7 kg.    Procedures: 10/9>>exploratory laparotomy, ileocecectomy, drainage of intraabdominal abscess  10/14>> exploratory laparotomy and splenectomy 10/14>> left subclavian triple-lumen catheter 01/17/2021  >> left ultrasound-guided thoracentesis 600 cc exudative fluid, gram stain -ve 01/17/2021  >> LUQ JP drain by IR   Consults: General surgery, PCCM, IR DVT Prophylaxis: Heparin Code Status:DNR Family Communication: discussed with niece by phone 11/5.  Discussed with sister in law at bedside today    Diet: Diet Order             Diet general           Diet regular Room service appropriate? Yes; Fluid consistency: Thin  Diet effective now                    Disposition Plan: Status is: Inpatient  Insurance has denied LTAC placement, so plan to discharge to subacute rehab  Barriers to Discharge: Waiting safe  disposition plan  MEDICATIONS: Scheduled Meds:  (feeding supplement) PROSource Plus  30 mL Oral Daily   acetaminophen  1,000 mg Oral Q6H   bisacodyl  10 mg Rectal Once   Chlorhexidine Gluconate Cloth  6 each Topical Daily   docusate sodium  100 mg Oral BID   enoxaparin (LOVENOX) injection  40 mg Subcutaneous Q24H   feeding supplement  1 Container Oral Q24H   feeding supplement  237 mL Oral Q24H    HYDROmorphone (DILAUDID) injection  0.5 mg Intravenous Once   insulin aspart  0-9 Units Subcutaneous TID WC   mouth rinse  15 mL Mouth Rinse BID   methocarbamol  500 mg Oral QID   metoprolol tartrate  50 mg Oral BID   mirtazapine  30 mg Oral QHS   multivitamin with minerals  1 tablet Oral Daily   oxyCODONE  20 mg Oral Q12H   pantoprazole  40 mg Oral Daily   Continuous Infusions:  sodium chloride 10 mL/hr at 01/29/21 0610   PRN Meds:.sodium chloride, bisacodyl, dextromethorphan, HYDROmorphone (DILAUDID) injection, hydroxypropyl methylcellulose / hypromellose, ipratropium-albuterol, ondansetron (ZOFRAN) IV, oxyCODONE, phenol, simethicone, sodium chloride, sodium chloride flush   I have personally reviewed following labs and imaging studies  LABORATORY DATA:  Recent Labs  Lab 02/08/21 0052  WBC 8.2  HGB 8.6*  HCT 28.2*  PLT 521*  MCV 91.9  MCH 28.0  MCHC 30.5  RDW 17.3*     Recent Labs  Lab 02/07/21 0245 02/08/21 0052 02/08/21 1213  NA 136 137  --   K 3.9 4.0  --   CL 103 107  --   CO2 22 22  --   GLUCOSE 99 97  --   BUN 20 16  --   CREATININE 1.03 1.08  --   CALCIUM 9.4 9.3  --   AST  --  21  --   ALT  --  13  --   ALKPHOS  --  71  --   BILITOT  --  0.6  --   ALBUMIN  --  2.2*  --   CRP  --   --  8.1*      RADIOLOGY STUDIES/RESULTS: CT ABDOMEN PELVIS WO CONTRAST  Result Date: 02/07/2021 CLINICAL DATA:  Left upper quadrant abscess status post percutaneous drainage. Decreased drain output. EXAM: CT ABDOMEN AND PELVIS WITHOUT CONTRAST TECHNIQUE:  Multidetector CT imaging of the abdomen and pelvis was performed following the standard protocol without IV contrast. COMPARISON:  None. FINDINGS: Lower chest: Small left pleural effusion is unchanged. Bibasilar atelectasis with subtotal collapse of the left lower lobe is again noted. Central venous catheter tip noted within the right atrium. Cardiac size within normal limits. Moderate coronary artery calcification. Hepatobiliary: Cholelithiasis without superimposed pericholecystic inflammatory change. Liver unremarkable. No intra or extrahepatic biliary ductal dilation. Pancreas: Unremarkable Spleen: Status post splenectomy. Pigtail drainage catheter is seen within the splenectomy bed. Pericatheter fluid collection noted on prior examination has been completely evacuated. Small amount of residual pericatheter soft tissue in keeping with inflammatory tissue is noted. Adrenals/Urinary Tract: Adrenal glands are unremarkable. Kidneys are normal, without renal calculi, focal lesion, or hydronephrosis. Bladder is unremarkable. Stomach/Bowel: Status post appendectomy. Omental infiltration within the left upper quadrant and right lower quadrant is stable since prior examination, most in keeping with postsurgical change. No discrete drainable fluid collection identified within the abdomen and pelvis. The rectal vault is fluid-filled, a finding that can present clinically as diarrhea. The stomach, small bowel, and large bowel are otherwise unremarkable. No evidence of obstruction. No free intraperitoneal gas or fluid. Vascular/Lymphatic: Aortic atherosclerosis. No enlarged abdominal or pelvic lymph nodes. Reproductive: Obscured by streak artifact from  right hip arthroplasty. Other: Tiny fat containing umbilical hernia noted. Musculoskeletal: Status post right total hip arthroplasty. Degenerative changes are seen within the lumbar spine. No acute bone abnormality. IMPRESSION: Left upper quadrant percutaneous drainage catheter  is unchanged in position. Previously noted pericatheter fluid collection has now completely resolved with a small amount of a pericatheter inflammatory tissue remaining. No loculated intra-abdominal fluid collection identified. Small left pleural effusion, likely reactive. Bibasilar atelectasis, left greater than right. Moderate coronary artery calcification. Cholelithiasis. Infiltration of the omentum, likely postoperative in nature related to splenectomy and appendectomy. Aortic Atherosclerosis (ICD10-I70.0). Electronically Signed   By: Fidela Salisbury M.D.   On: 02/07/2021 01:26   DG Chest Port 1V same Day  Result Date: 02/07/2021 CLINICAL DATA:  Shortness of breath EXAM: PORTABLE CHEST 1 VIEW COMPARISON:  01/21/2021 FINDINGS: Transverse diameter of heart is slightly increased. Thoracic aorta is tortuous and ectatic. Tip of PICC line is seen in the right atrium. There are no signs of alveolar pulmonary edema. There is improvement in aeration of left parahilar region and both lower lung fields. Residual patchy infiltrates are seen in the left lower lung fields. Linear density in the right parahilar region may suggest fluid in the interlobar fissure or subsegmental atelectasis. Left chest tube is noted. There is no pneumothorax. IMPRESSION: There is interval improvement in aeration of mid and lower lung fields suggesting resolving atelectasis/pneumonia. Small left pleural effusion. Electronically Signed   By: Elmer Picker M.D.   On: 02/07/2021 11:51   VAS [place] LOWER EXTREMITY VENOUS (DVT)  Result Date: 02/07/2021  Lower Venous DVT Study Patient Name:  JABRON WEESE Knickerbocker  Date of Exam:   02/07/2021 Medical Rec #: [id number]      Accession #:    [id number] Date of Birth: 1955/09/26     Patient Gender: M Patient Age:   89 years Exam Location:  [facility] Procedure:      VAS [place] LOWER EXTREMITY VENOUS (DVT) Referring Phys: Oren Binet  --------------------------------------------------------------------------------  Indications: Bilateral knee pain.  Comparison Study: No prior studies. Performing Technologist: Darlin Coco RDMS, RVT  Examination Guidelines: A complete evaluation includes B-mode imaging, spectral Doppler, color Doppler, and power Doppler as needed of all accessible portions of each vessel. Bilateral testing is considered an integral part of a complete examination. Limited examinations for reoccurring indications may be performed as noted. The reflux portion of the exam is performed with the patient in reverse Trendelenburg.  +---------+---------------+---------+-----------+----------+--------------+ RIGHT    CompressibilityPhasicitySpontaneityPropertiesThrombus Aging +---------+---------------+---------+-----------+----------+--------------+ CFV      Full           Yes      Yes                                 +---------+---------------+---------+-----------+----------+--------------+ SFJ      Full                                                        +---------+---------------+---------+-----------+----------+--------------+ FV Prox  Full                                                        +---------+---------------+---------+-----------+----------+--------------+  FV Mid   Full                                                        +---------+---------------+---------+-----------+----------+--------------+ FV DistalFull                                                        +---------+---------------+---------+-----------+----------+--------------+ PFV      Full                                                        +---------+---------------+---------+-----------+----------+--------------+ POP      Full           Yes      Yes                                 +---------+---------------+---------+-----------+----------+--------------+ PTV      Full                                                         +---------+---------------+---------+-----------+----------+--------------+ PERO     Full                                                        +---------+---------------+---------+-----------+----------+--------------+ Gastroc  Full                                                        +---------+---------------+---------+-----------+----------+--------------+   +---------+---------------+---------+-----------+----------+--------------+ LEFT     CompressibilityPhasicitySpontaneityPropertiesThrombus Aging +---------+---------------+---------+-----------+----------+--------------+ CFV      Full           Yes      Yes                                 +---------+---------------+---------+-----------+----------+--------------+ SFJ      Full                                                        +---------+---------------+---------+-----------+----------+--------------+ FV Prox  Full                                                        +---------+---------------+---------+-----------+----------+--------------+  FV Mid   Full                                                        +---------+---------------+---------+-----------+----------+--------------+ FV DistalFull                                                        +---------+---------------+---------+-----------+----------+--------------+ PFV      Full                                                        +---------+---------------+---------+-----------+----------+--------------+ POP      Full           Yes      Yes                                 +---------+---------------+---------+-----------+----------+--------------+ PTV      Full                                                        +---------+---------------+---------+-----------+----------+--------------+ PERO     Full                                                         +---------+---------------+---------+-----------+----------+--------------+ Gastroc  Full                                                        +---------+---------------+---------+-----------+----------+--------------+     Summary: RIGHT: - There is no evidence of deep vein thrombosis in the lower extremity.  - A cystic structure is found in the popliteal fossa.  LEFT: - There is no evidence of deep vein thrombosis in the lower extremity.  - No cystic structure found in the popliteal fossa.  *See table(s) above for measurements and observations. Electronically signed by Deitra Mayo MD on 02/07/2021 at 3:40:19 PM.    Final    VAS [place] UPPER EXTREMITY VENOUS DUPLEX  Result Date: 02/08/2021 UPPER VENOUS STUDY  Patient Name:  ISSAIH KAUS Klose  Date of Exam:   02/08/2021 Medical Rec #: [id number]      Accession #:    [id number] Date of Birth: Oct 24, 1955     Patient Gender: M Patient Age:   54 years Exam Location:  [facility] Procedure:      VAS [place] UPPER EXTREMITY VENOUS DUPLEX Referring Phys: Barkley Boards --------------------------------------------------------------------------------  Indications: Recent PICC LT arm, fever Comparison Study: No prior upper extremity  studies. Performing Technologist: Darlin Coco RDMS, RVT  Examination Guidelines: A complete evaluation includes B-mode imaging, spectral Doppler, color Doppler, and power Doppler as needed of all accessible portions of each vessel. Bilateral testing is considered an integral part of a complete examination. Limited examinations for reoccurring indications may be performed as noted.  Right Findings: +----------+------------+---------+-----------+----------+-------+ RIGHT     CompressiblePhasicitySpontaneousPropertiesSummary +----------+------------+---------+-----------+----------+-------+ IJV           Full       Yes       Yes                      +----------+------------+---------+-----------+----------+-------+  Subclavian    Full       Yes       Yes                      +----------+------------+---------+-----------+----------+-------+ Axillary      Full       Yes       Yes                      +----------+------------+---------+-----------+----------+-------+ Brachial      Full                                          +----------+------------+---------+-----------+----------+-------+ Radial        Full                                          +----------+------------+---------+-----------+----------+-------+ Ulnar         Full                                          +----------+------------+---------+-----------+----------+-------+ Cephalic      Full                                          +----------+------------+---------+-----------+----------+-------+ Basilic       Full                                          +----------+------------+---------+-----------+----------+-------+  Left Findings: +----------+------------+---------+-----------+-----------------+--------------+ LEFT      CompressiblePhasicitySpontaneous   Properties       Summary     +----------+------------+---------+-----------+-----------------+--------------+ IJV           Full       Yes       Yes                                    +----------+------------+---------+-----------+-----------------+--------------+ Subclavian    Full       Yes       Yes                                    +----------+------------+---------+-----------+-----------------+--------------+ Axillary      Full  Yes       Yes                                    +----------+------------+---------+-----------+-----------------+--------------+ Brachial    Partial      Yes       Yes     Appears loosely     Acute                                                    attached                    +----------+------------+---------+-----------+-----------------+--------------+ Radial                    Yes       Yes                           No                                                                   compressions                                                               performed                                                               distal to DVT  +----------+------------+---------+-----------+-----------------+--------------+ Ulnar                    Yes       Yes                           No                                                                   compressions                                                               performed  distal to DVT  +----------+------------+---------+-----------+-----------------+--------------+ Cephalic      Full                                                        +----------+------------+---------+-----------+-----------------+--------------+ Basilic       Full                                                        +----------+------------+---------+-----------+-----------------+--------------+  Summary:  Right: No evidence of deep vein thrombosis in the upper extremity. No evidence of superficial vein thrombosis in the upper extremity.  Left: Findings consistent with acute deep vein thrombosis involving the left brachial veins. No evidence of superficial vein thrombosis in the upper extremity.  *See table(s) above for measurements and observations.    Preliminary      LOS: 8 days   Signature -  Oren Binet M.D on 02/08/2021 at 2:20 PM   -  To page go to [email or web address]

## 2021-02-08 NOTE — Progress Notes (Signed)
Referring Physician(s): B. Grandville Silos  Supervising Physician: Michaelle Birks  Patient Status:  [facility] - In-pt  Chief Complaint:  F/U Drain  Brief History:  Travis [name] is a 65 year old male who is s/p appendectomy and partial cecectomy complicated by spontaneous splenic rupture necessitating splenectomy.   This was further complicated by intraabdominal fluid collection in the subdiaphragmatic space with an associated left-sided pleural effusion.    He is S/p thoracentesis and LUQ drain placement on 01/17/21 by Dr. Laurence Ferrari.  Hospital day 38  Subjective:  Doing well. Sitting up in chair.   Allergies: Patient has no known allergies.  Medications: Prior to Admission medications   Medication Sig Start Date End Date Taking? Authorizing Provider  albuterol (VENTOLIN HFA) 108 (90 Base) MCG/ACT inhaler Inhale 2 puffs into the lungs every 6 (six) hours as needed for wheezing or shortness of breath.   Yes [provider]  allopurinol (ZYLOPRIM) 100 MG tablet Take 1 tablet (100 mg total) by mouth 2 (two) times daily. 12/19/20  Yes [name], NP  aspirin 325 MG tablet Take 162.5 mg by mouth in the morning.   Yes [provider]  aspirin EC 81 MG tablet Take 1 tablet (81 mg total) by mouth daily. Swallow whole. 02/06/21  Yes Ghimire, Henreitta Leber, MD  atorvastatin (LIPITOR) 20 MG tablet Take 20 mg by mouth daily.   Yes [provider]  carvedilol (COREG) 6.25 MG tablet Take 1 tablet (6.25 mg total) by mouth 2 (two) times daily. 06/03/20  Yes Theora Gianotti, NP  cetirizine (ZYRTEC) 10 MG tablet Take 1 tablet (10 mg total) by mouth daily. 07/07/20  Yes [name], NP  fenofibrate (TRICOR) 145 MG tablet TAKE 1 TABLET BY MOUTH ONCE A DAY 11/23/20  Yes Dutch Quint B, FNP  ferrous sulfate 325 (65 FE) MG tablet Take 1 tablet (325 mg total) by mouth daily with breakfast. 06/02/20  Yes Baity, Coralie Keens, NP  fluticasone (FLONASE) 50 MCG/ACT nasal spray  Place 1-2 sprays into both nostrils daily as needed for allergies or rhinitis.   Yes [provider]  furosemide (LASIX) 40 MG tablet Take 1 tablet by mouth daily.   Yes [provider]  hydroxypropyl methylcellulose / hypromellose (ISOPTO TEARS / GONIOVISC) 2.5 % ophthalmic solution Place 1 drop into both eyes 3 (three) times daily as needed for dry eyes.   Yes [provider]  losartan (COZAAR) 25 MG tablet Take 0.5 tablets (12.5 mg total) by mouth daily. 12/19/20 03/19/21 Yes [name], NP  magnesium oxide (MAG-OX) 400 MG tablet Take 1 tablet (400 mg total) by mouth daily. 12/19/20  Yes [name], NP  multivitamin (ONE-A-DAY MEN'S) TABS tablet Take 1 tablet by mouth daily with breakfast.   Yes [provider]  Omega-3 Fatty Acids (FISH OIL PO) Take 1 capsule by mouth daily.   Yes [provider]  pantoprazole (PROTONIX) 40 MG tablet Take 1 tablet (40 mg total) by mouth daily. 06/02/20  Yes Baity, Coralie Keens, NP  triamcinolone cream (KENALOG) 0.1 % APPLY TO AFFECTED AREAS TWICE A DAY AS NEEDED. AVOID FACE, GROIN, OR UNDERARMS. Patient taking differently: Apply 1 application topically 2 (two) times daily as needed (dry skin, rash). 11/01/20  Yes Dutch Quint B, FNP  acetaminophen (TYLENOL) 500 MG tablet Take 2 tablets (1,000 mg total) by mouth every 8 (eight) hours as needed. 02/06/21   Ghimire, Henreitta Leber, MD  bisacodyl (DULCOLAX) 10 MG suppository Place 1  suppository (10 mg total) rectally daily as needed for moderate constipation. 02/06/21   Ghimire, Henreitta Leber, MD  insulin aspart (NOVOLOG) 100 UNIT/ML injection 0-9 Units, Subcutaneous, 3 times daily with meals CBG < 70: Implement Hypoglycemia measures CBG 70 - 120: 0 units CBG 121 - 150: 1 unit CBG 151 - 200: 2 units CBG 201 - 250: 3 units CBG 251 - 300: 5 units CBG 301 - 350: 7 units CBG 351 - 400: 9 units CBG > 400: call MD 02/06/21   Jonetta Osgood, MD  ipratropium-albuterol (DUONEB) 0.5-2.5 (3)  MG/3ML SOLN Take 3 mLs by nebulization every 6 (six) hours as needed. 02/06/21   Ghimire, Henreitta Leber, MD  methocarbamol (ROBAXIN) 500 MG tablet Take 1 tablet (500 mg total) by mouth every 8 (eight) hours as needed for muscle spasms. 02/06/21   Ghimire, Henreitta Leber, MD  metoprolol tartrate (LOPRESSOR) 50 MG tablet Take 1 tablet (50 mg total) by mouth 2 (two) times daily. 02/06/21   Ghimire, Henreitta Leber, MD  mirtazapine (REMERON) 30 MG tablet Take 1 tablet (30 mg total) by mouth at bedtime. 02/06/21   Ghimire, Henreitta Leber, MD  Nutritional Supplements (,FEEDING SUPPLEMENT, PROSOURCE PLUS) liquid Take 30 mLs by mouth 2 (two) times daily between meals. 02/06/21   Ghimire, Henreitta Leber, MD  oxyCODONE (OXY IR/ROXICODONE) 5 MG immediate release tablet Take 1 tablet (5 mg total) by mouth every 6 (six) hours as needed for moderate pain or severe pain. 02/06/21   Ghimire, Henreitta Leber, MD  oxyCODONE (OXYCONTIN) 20 mg 12 hr tablet Take 1 tablet (20 mg total) by mouth every 12 (twelve) hours. 02/06/21   Ghimire, Henreitta Leber, MD  polyethylene glycol (MIRALAX / GLYCOLAX) 17 g packet Take 17 g by mouth daily. 02/07/21   Jonetta Osgood, MD     Vital Signs: BP 117/79 (BP Location: Right Arm)   Pulse 88   Temp 98.3 F (36.8 C) (Oral)   Resp 17   Ht 6' (1.829 m)   Wt 78.7 kg   SpO2 99%   BMI 23.53 kg/m   Physical Exam Constitutional:      Appearance: Normal appearance.  HENT:     Head: Normocephalic and atraumatic.  Cardiovascular:     Rate and Rhythm: Normal rate.  Pulmonary:     Effort: Pulmonary effort is normal. No respiratory distress.  Abdominal:     Palpations: Abdomen is soft.     Tenderness: There is no abdominal tenderness.  Skin:    General: Skin is warm and dry.  Neurological:     General: No focal deficit present.     Mental Status: He is alert and oriented to person, place, and time.  Psychiatric:        Mood and Affect: Mood normal.        Behavior: Behavior normal.        Thought Content:  Thought content normal.        Judgment: Judgment normal.  Drain Location: LUQ Size: Fr size: 12 Fr Date of placement: 01/17/21 Currently to: Drain collection device: suction bulb 24 hour output:  Output by Drain (mL) 02/06/21 0701 - 02/06/21 1900 02/06/21 1901 - 02/07/21 0700 02/07/21 0701 - 02/07/21 1900 02/07/21 1901 - 02/08/21 0700 02/08/21 0701 - 02/08/21 1455  Closed System Drain Left Abdomen Bulb (JP) 12 Fr. 5  5 5      Current examination: Flushes/aspirates easily.  Insertion site unremarkable. Suture and stat lock in place. Dressed appropriately.  Imaging: CT ABDOMEN PELVIS WO CONTRAST  Result Date: 02/07/2021 CLINICAL DATA:  Left upper quadrant abscess status post percutaneous drainage. Decreased drain output. EXAM: CT ABDOMEN AND PELVIS WITHOUT CONTRAST TECHNIQUE: Multidetector CT imaging of the abdomen and pelvis was performed following the standard protocol without IV contrast. COMPARISON:  None. FINDINGS: Lower chest: Small left pleural effusion is unchanged. Bibasilar atelectasis with subtotal collapse of the left lower lobe is again noted. Central venous catheter tip noted within the right atrium. Cardiac size within normal limits. Moderate coronary artery calcification. Hepatobiliary: Cholelithiasis without superimposed pericholecystic inflammatory change. Liver unremarkable. No intra or extrahepatic biliary ductal dilation. Pancreas: Unremarkable Spleen: Status post splenectomy. Pigtail drainage catheter is seen within the splenectomy bed. Pericatheter fluid collection noted on prior examination has been completely evacuated. Small amount of residual pericatheter soft tissue in keeping with inflammatory tissue is noted. Adrenals/Urinary Tract: Adrenal glands are unremarkable. Kidneys are normal, without renal calculi, focal lesion, or hydronephrosis. Bladder is unremarkable. Stomach/Bowel: Status post appendectomy. Omental infiltration within the left upper quadrant and right  lower quadrant is stable since prior examination, most in keeping with postsurgical change. No discrete drainable fluid collection identified within the abdomen and pelvis. The rectal vault is fluid-filled, a finding that can present clinically as diarrhea. The stomach, small bowel, and large bowel are otherwise unremarkable. No evidence of obstruction. No free intraperitoneal gas or fluid. Vascular/Lymphatic: Aortic atherosclerosis. No enlarged abdominal or pelvic lymph nodes. Reproductive: Obscured by streak artifact from right hip arthroplasty. Other: Tiny fat containing umbilical hernia noted. Musculoskeletal: Status post right total hip arthroplasty. Degenerative changes are seen within the lumbar spine. No acute bone abnormality. IMPRESSION: Left upper quadrant percutaneous drainage catheter is unchanged in position. Previously noted pericatheter fluid collection has now completely resolved with a small amount of a pericatheter inflammatory tissue remaining. No loculated intra-abdominal fluid collection identified. Small left pleural effusion, likely reactive. Bibasilar atelectasis, left greater than right. Moderate coronary artery calcification. Cholelithiasis. Infiltration of the omentum, likely postoperative in nature related to splenectomy and appendectomy. Aortic Atherosclerosis (ICD10-I70.0). Electronically Signed   By: Fidela Salisbury M.D.   On: 02/07/2021 01:26   DG Chest Port 1V same Day  Result Date: 02/07/2021 CLINICAL DATA:  Shortness of breath EXAM: PORTABLE CHEST 1 VIEW COMPARISON:  01/21/2021 FINDINGS: Transverse diameter of heart is slightly increased. Thoracic aorta is tortuous and ectatic. Tip of PICC line is seen in the right atrium. There are no signs of alveolar pulmonary edema. There is improvement in aeration of left parahilar region and both lower lung fields. Residual patchy infiltrates are seen in the left lower lung fields. Linear density in the right parahilar region may suggest  fluid in the interlobar fissure or subsegmental atelectasis. Left chest tube is noted. There is no pneumothorax. IMPRESSION: There is interval improvement in aeration of mid and lower lung fields suggesting resolving atelectasis/pneumonia. Small left pleural effusion. Electronically Signed   By: Elmer Picker M.D.   On: 02/07/2021 11:51   VAS [place] LOWER EXTREMITY VENOUS (DVT)  Result Date: 02/07/2021  Lower Venous DVT Study Patient Name:  Travis [name]  Date of Exam:   02/07/2021 Medical Rec #: [id number]      Accession #:    [id number] Date of Birth: 1956-03-01     Patient Gender: M Patient Age:   66 years Exam Location:  [facility] Procedure:      VAS [place] LOWER EXTREMITY VENOUS (DVT) Referring Phys: Oren Binet --------------------------------------------------------------------------------  Indications: Bilateral knee pain.  Comparison Study: No prior studies. Performing Technologist: Darlin Coco RDMS, RVT  Examination Guidelines: A complete evaluation includes B-mode imaging, spectral Doppler, color Doppler, and power Doppler as needed of all accessible portions of each vessel. Bilateral testing is considered an integral part of a complete examination. Limited examinations for reoccurring indications may be performed as noted. The reflux portion of the exam is performed with the patient in reverse Trendelenburg.  +---------+---------------+---------+-----------+----------+--------------+ RIGHT    CompressibilityPhasicitySpontaneityPropertiesThrombus Aging +---------+---------------+---------+-----------+----------+--------------+ CFV      Full           Yes      Yes                                 +---------+---------------+---------+-----------+----------+--------------+ SFJ      Full                                                        +---------+---------------+---------+-----------+----------+--------------+ FV Prox  Full                                                         +---------+---------------+---------+-----------+----------+--------------+ FV Mid   Full                                                        +---------+---------------+---------+-----------+----------+--------------+ FV DistalFull                                                        +---------+---------------+---------+-----------+----------+--------------+ PFV      Full                                                        +---------+---------------+---------+-----------+----------+--------------+ POP      Full           Yes      Yes                                 +---------+---------------+---------+-----------+----------+--------------+ PTV      Full                                                        +---------+---------------+---------+-----------+----------+--------------+ PERO     Full                                                        +---------+---------------+---------+-----------+----------+--------------+  Gastroc  Full                                                        +---------+---------------+---------+-----------+----------+--------------+   +---------+---------------+---------+-----------+----------+--------------+ LEFT     CompressibilityPhasicitySpontaneityPropertiesThrombus Aging +---------+---------------+---------+-----------+----------+--------------+ CFV      Full           Yes      Yes                                 +---------+---------------+---------+-----------+----------+--------------+ SFJ      Full                                                        +---------+---------------+---------+-----------+----------+--------------+ FV Prox  Full                                                        +---------+---------------+---------+-----------+----------+--------------+ FV Mid   Full                                                         +---------+---------------+---------+-----------+----------+--------------+ FV DistalFull                                                        +---------+---------------+---------+-----------+----------+--------------+ PFV      Full                                                        +---------+---------------+---------+-----------+----------+--------------+ POP      Full           Yes      Yes                                 +---------+---------------+---------+-----------+----------+--------------+ PTV      Full                                                        +---------+---------------+---------+-----------+----------+--------------+ PERO     Full                                                        +---------+---------------+---------+-----------+----------+--------------+  Gastroc  Full                                                        +---------+---------------+---------+-----------+----------+--------------+     Summary: RIGHT: - There is no evidence of deep vein thrombosis in the lower extremity.  - A cystic structure is found in the popliteal fossa.  LEFT: - There is no evidence of deep vein thrombosis in the lower extremity.  - No cystic structure found in the popliteal fossa.  *See table(s) above for measurements and observations. Electronically signed by Deitra Mayo MD on 02/07/2021 at 3:40:19 PM.    Final    VAS [place] UPPER EXTREMITY VENOUS DUPLEX  Result Date: 02/08/2021 UPPER VENOUS STUDY  Patient Name:  Travis [name]  Date of Exam:   02/08/2021 Medical Rec #: [id number]      Accession #:    [id number] Date of Birth: 07-Jan-1956     Patient Gender: M Patient Age:   17 years Exam Location:  [facility] Procedure:      VAS [place] UPPER EXTREMITY VENOUS DUPLEX Referring Phys: Barkley Boards --------------------------------------------------------------------------------  Indications: Recent PICC LT arm, fever Comparison Study: No prior  upper extremity studies. Performing Technologist: Darlin Coco RDMS, RVT  Examination Guidelines: A complete evaluation includes B-mode imaging, spectral Doppler, color Doppler, and power Doppler as needed of all accessible portions of each vessel. Bilateral testing is considered an integral part of a complete examination. Limited examinations for reoccurring indications may be performed as noted.  Right Findings: +----------+------------+---------+-----------+----------+-------+ RIGHT     CompressiblePhasicitySpontaneousPropertiesSummary +----------+------------+---------+-----------+----------+-------+ IJV           Full       Yes       Yes                      +----------+------------+---------+-----------+----------+-------+ Subclavian    Full       Yes       Yes                      +----------+------------+---------+-----------+----------+-------+ Axillary      Full       Yes       Yes                      +----------+------------+---------+-----------+----------+-------+ Brachial      Full                                          +----------+------------+---------+-----------+----------+-------+ Radial        Full                                          +----------+------------+---------+-----------+----------+-------+ Ulnar         Full                                          +----------+------------+---------+-----------+----------+-------+ Cephalic      Full                                          +----------+------------+---------+-----------+----------+-------+  Basilic       Full                                          +----------+------------+---------+-----------+----------+-------+  Left Findings: +----------+------------+---------+-----------+-----------------+--------------+ LEFT      CompressiblePhasicitySpontaneous   Properties       Summary     +----------+------------+---------+-----------+-----------------+--------------+  IJV           Full       Yes       Yes                                    +----------+------------+---------+-----------+-----------------+--------------+ Subclavian    Full       Yes       Yes                                    +----------+------------+---------+-----------+-----------------+--------------+ Axillary      Full       Yes       Yes                                    +----------+------------+---------+-----------+-----------------+--------------+ Brachial    Partial      Yes       Yes     Appears loosely     Acute                                                    attached                    +----------+------------+---------+-----------+-----------------+--------------+ Radial                   Yes       Yes                           No                                                                   compressions                                                               performed                                                               distal to  DVT  +----------+------------+---------+-----------+-----------------+--------------+ Ulnar                    Yes       Yes                           No                                                                   compressions                                                               performed                                                               distal to DVT  +----------+------------+---------+-----------+-----------------+--------------+ Cephalic      Full                                                        +----------+------------+---------+-----------+-----------------+--------------+ Basilic       Full                                                        +----------+------------+---------+-----------+-----------------+--------------+  Summary:  Right: No evidence of deep vein thrombosis in the upper extremity. No  evidence of superficial vein thrombosis in the upper extremity.  Left: Findings consistent with acute deep vein thrombosis involving the left brachial veins. No evidence of superficial vein thrombosis in the upper extremity.  *See table(s) above for measurements and observations.    Preliminary     Labs:  CBC: Recent Labs    01/27/21 0354 01/28/21 0355 01/29/21 0417 02/08/21 0052  WBC 11.2* 11.1* 13.7* 8.2  HGB 8.7* 8.8* 8.8* 8.6*  HCT 27.9* 28.8* 28.6* 28.2*  PLT 608* 592* 583* 521*    COAGS: Recent Labs    01/01/21 1340 01/06/21 0308 01/06/21 0835  INR 1.1 1.3* 1.2  APTT  --   --  26    BMP: Recent Labs    01/26/21 0346 01/27/21 0354 02/07/21 0245 02/08/21 0052  NA 137 138 136 137  K 3.8 3.8 3.9 4.0  CL 104 104 103 107  CO2 26 25 22 22   GLUCOSE 93 115* 99 97  BUN 22 23 20 16   CALCIUM 9.6 9.8 9.4 9.3  CREATININE 1.14 1.10 1.03 1.08  GFRNONAA >60 >60 >60 >60    LIVER FUNCTION TESTS: Recent Labs    01/25/21 0520  01/26/21 0346 01/27/21 0354 02/08/21 0052  BILITOT 0.5 0.7 1.1 0.6  AST 22 25 25 21   ALT 23 23 24 13   ALKPHOS 146* 151* 141* 71  PROT 6.5 6.8 6.8 6.9  ALBUMIN 1.8* 1.9* 1.9* 2.2*    Assessment and Plan:  LUQ drain placement with IR on 01/17/21 by Dr. Laurence Ferrari.   Drain output has decreased to 5 mL daily, less cloudy.  CT scan done yesterday showed= Left upper quadrant percutaneous drainage catheter is unchanged in position. Previously noted pericatheter fluid collection has now completely resolved with a small amount of a pericatheter inflammatory tissue remaining. No loculated intra-abdominal fluid collection identified.  Images reviewed by Dr. Maryelizabeth Kaufmann = he recommends  removal of the drain.  Drain removed easily, patient tolerated well, clean dry dressing placed over site.  Ok to remove drain site dressing on Friday. No need for IR Follow up, will sign off.  Electronically Signed: Murrell Redden, PA-C 02/08/2021, 2:53  PM    I spent a total of 15 Minutes at the the patient's bedside AND on the patient's hospital floor or unit, greater than 50% of which was counseling/coordinating care for drain follow up and removal.

## 2021-02-08 NOTE — TOC Progression Note (Signed)
Transition of Care [facility]) - Progression Note    Patient Details  Name: Travis [name] MRN: [id number] Date of Birth: 14-Jan-1956  Transition of [facility]) CM/SW [place], LCSW Phone Number: 02/08/2021, 1:36 PM  Clinical Narrative:    CSW updated [facility] and patient's niece that he is not medically ready for discharge yet.    Expected Discharge Plan: Long Term Acute Care (LTAC) Barriers to Discharge: Continued Medical Work up, Orthoptist and Services Expected Discharge Plan: [place] (LTAC) In-house Referral: Clinical Social Work   Post Acute Care Choice: Long Term Acute Care (LTAC) Living arrangements for the past 2 months: Single Family Home Expected Discharge Date: 02/06/21                                     Social Determinants of Health (SDOH) Interventions    Readmission Risk Interventions No flowsheet data found.

## 2021-02-08 NOTE — Consult Note (Signed)
   [facility] Inpatient Consult   02/08/2021  STARLING CHRISTOFFERSON [birth year] [id number]  [facility] Patient: [name]   Follow up LLOS day 38  Progress notes reviewed for disposition from inpatient TOC LCSW. Not medically ready   For questions:  Natividad Brood, RN BSN [facility] Liaison  [phone] business mobile phone Toll free office [phone]  Fax number: [phone] [email or web address]

## 2021-02-08 NOTE — Progress Notes (Signed)
Occupational Therapy Treatment Patient Details Name: Travis [name] MRN: [id number] DOB: February 25, 1956 Today's Date: 02/08/2021   History of present illness Pt is a 65 y/o male admitted 10/9 with complaints of R lower quadrant abdominal pain. Found with intra-abdominal sepsis with perforated appendix and abdominal abscess; s/p diagnostic laparoscopy, ileocecectomy, drainage of interaabdominal abscess 10/9. Post op ileus. 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 Pt self extubated 10/16. L thoracentesis 10/25 with 600cc drained. PMHx: CHF, CKD, COPD, TIA, diabetes, HTN, gout, CAD.   OT comments  Patient with good progress toward patient focused goals.  All goals updated and currently set at Mod I with ADL and toileting in the acute care setting.  Deficits impacting independence are listed below.  Currently OT is recommending SNF for continued post acute rehab to maximize his functional status and ensure a safe discharge, as he lives alone.  But, if family could provide increased assist with meals, supervision for showers, and assist with community mobility throughout the day, he may be able to transition home with [facility] services.     Recommendations for follow up therapy are one component of a multi-disciplinary discharge planning process, led by the attending physician.  Recommendations may be updated based on patient status, additional functional criteria and insurance authorization.    Follow Up Recommendations  Skilled nursing-short term rehab (<3 hours/day) unless family could provide increased assist with [facility] OT for a period of time, then home could be an option.  Continued mentation and safety are a deficit.     Assistance Recommended at Discharge Frequent or constant Supervision/Assistance  Equipment Recommendations  BSC/3in1    Recommendations for Other Services      Precautions / Restrictions Precautions Precautions: Fall Precaution Comments: LUQ drain Restrictions Weight  Bearing Restrictions: No Other Position/Activity Restrictions: Occasional L knee buckle       Mobility Bed Mobility Overal bed mobility: Modified Independent Bed Mobility: Supine to Sit                Transfers Overall transfer level: Needs assistance Equipment used: Rolling walker (2 wheels) Transfers: Sit to/from Stand Sit to Stand: Supervision                 Balance Overall balance assessment: Needs assistance Sitting-balance support: Feet supported Sitting balance-Leahy Scale: Good     Standing balance support: Bilateral upper extremity supported;Reliant on assistive device for balance Standing balance-Leahy Scale: Poor                             ADL either performed or assessed with clinical judgement   ADL Overall ADL's : Needs assistance/impaired     Grooming: Supervision/safety;Standing   Upper Body Bathing: Set up;Sitting;Standing   Lower Body Bathing: Min guard;Sit to/from stand   Upper Body Dressing : Sitting;Set up   Lower Body Dressing: Min guard;Sit to/from stand   Toilet Transfer: Supervision/safety;Rolling walker (2 wheels);Ambulation;Regular Toilet   Toileting- Clothing Manipulation and Hygiene: Supervision/safety;Sitting/lateral lean       Functional mobility during ADLs: Supervision/safety;Rolling walker (2 wheels)      Extremity/Trunk Assessment Upper Extremity Assessment Upper Extremity Assessment: Overall WFL for tasks assessed   Lower Extremity Assessment Lower Extremity Assessment: Defer to PT evaluation   Cervical / Trunk Assessment Cervical / Trunk Assessment: Normal                      Cognition Arousal/Alertness: Awake/alert Behavior During Therapy: [facility]  for tasks assessed/performed Overall Cognitive Status: No family/caregiver present to determine baseline cognitive functioning                       Memory: Decreased short-term memory Following Commands: Follows multi-step  commands with increased time     Problem Solving: Requires verbal cues                              Pertinent Vitals/ Pain       Pain Assessment: No/denies pain                                                          Frequency  Min 2X/week        Progress Toward Goals  OT Goals(current goals can now be found in the care plan section)  Progress towards OT goals: Progressing toward goals  Acute Rehab OT Goals OT Goal Formulation: With patient Time For Goal Achievement: 02/22/21 Potential to Achieve Goals: Good  Plan Discharge plan remains appropriate;Frequency remains appropriate    Co-evaluation                 AM-PAC OT "6 Clicks" Daily Activity     Outcome Measure   Help from another person eating meals?: None Help from another person taking care of personal grooming?: None Help from another person toileting, which includes using toliet, bedpan, or urinal?: A Little Help from another person bathing (including washing, rinsing, drying)?: A Little Help from another person to put on and taking off regular upper body clothing?: A Little Help from another person to put on and taking off regular lower body clothing?: A Little 6 Click Score: 20    End of Session Equipment Utilized During Treatment: Rolling walker (2 wheels)  OT Visit Diagnosis: Other abnormalities of gait and mobility (R26.89);Muscle weakness (generalized) (M62.81);Pain;Other symptoms and signs involving cognitive function   Activity Tolerance Patient tolerated treatment well   Patient Left in chair;with call bell/phone within reach   Nurse Communication Mobility status        Time: 9021-1155 OT Time Calculation (min): 17 min  Charges: OT General Charges $OT Visit: 1 Visit OT Treatments $Self Care/Home Management : 8-22 mins  02/08/2021  RP, OTR/L  Acute Rehabilitation Services  Office:  [phone]   Metta Clines 02/08/2021, 2:36  PM

## 2021-02-08 NOTE — Progress Notes (Signed)
Progress Note  33 Days Post-Op  Subjective: Patient reports feeling cold this AM, had a little bit of sweating overnight. Denies nausea or abdominal pain. He reports that he like the boost drink he is having this AM.   Objective: Vital signs in last 24 hours: Temp:  [98.2 F (36.8 C)-100.6 F (38.1 C)] 98.3 F (36.8 C) (11/16 0800) Pulse Rate:  [86-103] 88 (11/16 0800) Resp:  [17-20] 17 (11/16 0800) BP: (112-124)/(68-79) 117/79 (11/16 0800) SpO2:  [92 %-99 %] 99 % (11/16 0800) Weight:  [78.7 kg] 78.7 kg (11/16 0500) Last BM Date: 02/07/21  Intake/Output from previous day: 11/15 0701 - 11/16 0700 In: 280.7 [P.O.:120; I.V.:105; IV Piggyback:45.7] Out: 560 [Urine:550; Drains:10] Intake/Output this shift: No intake/output data recorded.  PE: Gen: resting comfortably, NAD Pulm: rate and effort normal Abd: Soft, mild distension, nontender, midline dressing c/d/i   Lab Results:  Recent Labs    02/08/21 0052  WBC 8.2  HGB 8.6*  HCT 28.2*  PLT 521*   BMET Recent Labs    02/07/21 0245 02/08/21 0052  NA 136 137  K 3.9 4.0  CL 103 107  CO2 22 22  GLUCOSE 99 97  BUN 20 16  CREATININE 1.03 1.08  CALCIUM 9.4 9.3   PT/INR No results for input(s): LABPROT, INR in the last 72 hours. CMP     Component Value Date/Time   NA 137 02/08/2021 0052   NA 138 06/20/2020 1034   NA 141 04/20/2014 1019   K 4.0 02/08/2021 0052   K 3.7 04/20/2014 1019   CL 107 02/08/2021 0052   CL 106 04/20/2014 1019   CO2 22 02/08/2021 0052   CO2 30 04/20/2014 1019   GLUCOSE 97 02/08/2021 0052   GLUCOSE 102 (H) 04/20/2014 1019   BUN 16 02/08/2021 0052   BUN 18 06/20/2020 1034   BUN 14 04/20/2014 1019   CREATININE 1.08 02/08/2021 0052   CREATININE 1.18 04/20/2014 1019   CALCIUM 9.3 02/08/2021 0052   CALCIUM 9.3 04/20/2014 1019   PROT 6.9 02/08/2021 0052   PROT 6.5 06/18/2017 1425   PROT 7.4 04/20/2014 1019   ALBUMIN 2.2 (L) 02/08/2021 0052   ALBUMIN 4.0 06/18/2017 1425   ALBUMIN  3.4 04/20/2014 1019   AST 21 02/08/2021 0052   AST 21 04/20/2014 1019   ALT 13 02/08/2021 0052   ALT 20 04/20/2014 1019   ALKPHOS 71 02/08/2021 0052   ALKPHOS 97 04/20/2014 1019   BILITOT 0.6 02/08/2021 0052   BILITOT 0.4 06/18/2017 1425   BILITOT 0.6 04/20/2014 1019   GFRNONAA >60 02/08/2021 0052   GFRNONAA >60 04/20/2014 1019   GFRNONAA >60 11/19/2012 1107   GFRAA 53 (L) 01/13/2020 1410   GFRAA >60 04/20/2014 1019   GFRAA >60 11/19/2012 1107   Lipase     Component Value Date/Time   LIPASE 31 01/01/2021 1023   LIPASE 112 04/20/2014 1019       Studies/Results: CT ABDOMEN PELVIS WO CONTRAST  Result Date: 02/07/2021 CLINICAL DATA:  Left upper quadrant abscess status post percutaneous drainage. Decreased drain output. EXAM: CT ABDOMEN AND PELVIS WITHOUT CONTRAST TECHNIQUE: Multidetector CT imaging of the abdomen and pelvis was performed following the standard protocol without IV contrast. COMPARISON:  None. FINDINGS: Lower chest: Small left pleural effusion is unchanged. Bibasilar atelectasis with subtotal collapse of the left lower lobe is again noted. Central venous catheter tip noted within the right atrium. Cardiac size within normal limits. Moderate coronary artery calcification. Hepatobiliary: Cholelithiasis without  superimposed pericholecystic inflammatory change. Liver unremarkable. No intra or extrahepatic biliary ductal dilation. Pancreas: Unremarkable Spleen: Status post splenectomy. Pigtail drainage catheter is seen within the splenectomy bed. Pericatheter fluid collection noted on prior examination has been completely evacuated. Small amount of residual pericatheter soft tissue in keeping with inflammatory tissue is noted. Adrenals/Urinary Tract: Adrenal glands are unremarkable. Kidneys are normal, without renal calculi, focal lesion, or hydronephrosis. Bladder is unremarkable. Stomach/Bowel: Status post appendectomy. Omental infiltration within the left upper quadrant and  right lower quadrant is stable since prior examination, most in keeping with postsurgical change. No discrete drainable fluid collection identified within the abdomen and pelvis. The rectal vault is fluid-filled, a finding that can present clinically as diarrhea. The stomach, small bowel, and large bowel are otherwise unremarkable. No evidence of obstruction. No free intraperitoneal gas or fluid. Vascular/Lymphatic: Aortic atherosclerosis. No enlarged abdominal or pelvic lymph nodes. Reproductive: Obscured by streak artifact from right hip arthroplasty. Other: Tiny fat containing umbilical hernia noted. Musculoskeletal: Status post right total hip arthroplasty. Degenerative changes are seen within the lumbar spine. No acute bone abnormality. IMPRESSION: Left upper quadrant percutaneous drainage catheter is unchanged in position. Previously noted pericatheter fluid collection has now completely resolved with a small amount of a pericatheter inflammatory tissue remaining. No loculated intra-abdominal fluid collection identified. Small left pleural effusion, likely reactive. Bibasilar atelectasis, left greater than right. Moderate coronary artery calcification. Cholelithiasis. Infiltration of the omentum, likely postoperative in nature related to splenectomy and appendectomy. Aortic Atherosclerosis (ICD10-I70.0). Electronically Signed   By: Fidela Salisbury M.D.   On: 02/07/2021 01:26   DG Chest Port 1V same Day  Result Date: 02/07/2021 CLINICAL DATA:  Shortness of breath EXAM: PORTABLE CHEST 1 VIEW COMPARISON:  01/21/2021 FINDINGS: Transverse diameter of heart is slightly increased. Thoracic aorta is tortuous and ectatic. Tip of PICC line is seen in the right atrium. There are no signs of alveolar pulmonary edema. There is improvement in aeration of left parahilar region and both lower lung fields. Residual patchy infiltrates are seen in the left lower lung fields. Linear density in the right parahilar region may  suggest fluid in the interlobar fissure or subsegmental atelectasis. Left chest tube is noted. There is no pneumothorax. IMPRESSION: There is interval improvement in aeration of mid and lower lung fields suggesting resolving atelectasis/pneumonia. Small left pleural effusion. Electronically Signed   By: Elmer Picker M.D.   On: 02/07/2021 11:51   VAS [place] LOWER EXTREMITY VENOUS (DVT)  Result Date: 02/07/2021  Lower Venous DVT Study Patient Name:  MAZIAH KEELING Stagliano  Date of Exam:   02/07/2021 Medical Rec #: [id number]      Accession #:    [id number] Date of Birth: 1955/07/07     Patient Gender: M Patient Age:   23 years Exam Location:  [facility] Procedure:      VAS [place] LOWER EXTREMITY VENOUS (DVT) Referring Phys: Oren Binet --------------------------------------------------------------------------------  Indications: Bilateral knee pain.  Comparison Study: No prior studies. Performing Technologist: Darlin Coco RDMS, RVT  Examination Guidelines: A complete evaluation includes B-mode imaging, spectral Doppler, color Doppler, and power Doppler as needed of all accessible portions of each vessel. Bilateral testing is considered an integral part of a complete examination. Limited examinations for reoccurring indications may be performed as noted. The reflux portion of the exam is performed with the patient in reverse Trendelenburg.  +---------+---------------+---------+-----------+----------+--------------+ RIGHT    CompressibilityPhasicitySpontaneityPropertiesThrombus Aging +---------+---------------+---------+-----------+----------+--------------+ CFV      Full  Yes      Yes                                 +---------+---------------+---------+-----------+----------+--------------+ SFJ      Full                                                        +---------+---------------+---------+-----------+----------+--------------+ FV Prox  Full                                                         +---------+---------------+---------+-----------+----------+--------------+ FV Mid   Full                                                        +---------+---------------+---------+-----------+----------+--------------+ FV DistalFull                                                        +---------+---------------+---------+-----------+----------+--------------+ PFV      Full                                                        +---------+---------------+---------+-----------+----------+--------------+ POP      Full           Yes      Yes                                 +---------+---------------+---------+-----------+----------+--------------+ PTV      Full                                                        +---------+---------------+---------+-----------+----------+--------------+ PERO     Full                                                        +---------+---------------+---------+-----------+----------+--------------+ Gastroc  Full                                                        +---------+---------------+---------+-----------+----------+--------------+   +---------+---------------+---------+-----------+----------+--------------+ LEFT     CompressibilityPhasicitySpontaneityPropertiesThrombus Aging +---------+---------------+---------+-----------+----------+--------------+ CFV  Full           Yes      Yes                                 +---------+---------------+---------+-----------+----------+--------------+ SFJ      Full                                                        +---------+---------------+---------+-----------+----------+--------------+ FV Prox  Full                                                        +---------+---------------+---------+-----------+----------+--------------+ FV Mid   Full                                                         +---------+---------------+---------+-----------+----------+--------------+ FV DistalFull                                                        +---------+---------------+---------+-----------+----------+--------------+ PFV      Full                                                        +---------+---------------+---------+-----------+----------+--------------+ POP      Full           Yes      Yes                                 +---------+---------------+---------+-----------+----------+--------------+ PTV      Full                                                        +---------+---------------+---------+-----------+----------+--------------+ PERO     Full                                                        +---------+---------------+---------+-----------+----------+--------------+ Gastroc  Full                                                        +---------+---------------+---------+-----------+----------+--------------+  Summary: RIGHT: - There is no evidence of deep vein thrombosis in the lower extremity.  - A cystic structure is found in the popliteal fossa.  LEFT: - There is no evidence of deep vein thrombosis in the lower extremity.  - No cystic structure found in the popliteal fossa.  *See table(s) above for measurements and observations. Electronically signed by Deitra Mayo MD on 02/07/2021 at 3:40:19 PM.    Final     Anti-infectives: Anti-infectives (From admission, onward)    Start     Dose/Rate Route Frequency Ordered Stop   02/08/21 0000  vancomycin (VANCOREADY) IVPB 1750 mg/350 mL  Status:  Discontinued        1,750 mg 175 mL/hr over 120 Minutes Intravenous Every 24 hours 02/07/21 0104 02/07/21 1033   02/07/21 0800  piperacillin-tazobactam (ZOSYN) IVPB 3.375 g  Status:  Discontinued        3.375 g 12.5 mL/hr over 240 Minutes Intravenous Every 8 hours 02/07/21 0104 02/07/21 1033   02/07/21 0200  vancomycin (VANCOREADY) IVPB 1750  mg/350 mL        1,750 mg 175 mL/hr over 120 Minutes Intravenous  Once 02/07/21 0104 02/07/21 0705   02/07/21 0200  piperacillin-tazobactam (ZOSYN) IVPB 3.375 g        3.375 g 100 mL/hr over 30 Minutes Intravenous  Once 02/07/21 0104 02/07/21 0705   01/19/21 1200  cefTRIAXone (ROCEPHIN) 2 g in sodium chloride 0.9 % 100 mL IVPB  Status:  Discontinued        2 g 200 mL/hr over 30 Minutes Intravenous Every 24 hours 01/19/21 1056 02/06/21 1500   01/19/21 1145  metroNIDAZOLE (FLAGYL) tablet 500 mg  Status:  Discontinued        500 mg Oral Every 12 hours 01/19/21 1056 02/06/21 1500   01/18/21 0615  vancomycin (VANCOREADY) IVPB 1250 mg/250 mL  Status:  Discontinued        1,250 mg 166.7 mL/hr over 90 Minutes Intravenous Every 24 hours 01/18/21 0517 01/19/21 1400   01/17/21 1330  Ampicillin-Sulbactam (UNASYN) 3 g in sodium chloride 0.9 % 100 mL IVPB  Status:  Discontinued        3 g 200 mL/hr over 30 Minutes Intravenous Every 8 hours 01/17/21 1239 01/19/21 1056   01/09/21 1100  piperacillin-tazobactam (ZOSYN) IVPB 3.375 g  Status:  Discontinued        3.375 g 12.5 mL/hr over 240 Minutes Intravenous Every 8 hours 01/09/21 1000 01/12/21 0835   01/09/21 0830  ceFEPIme (MAXIPIME) 2 g in sodium chloride 0.9 % 100 mL IVPB  Status:  Discontinued        2 g 200 mL/hr over 30 Minutes Intravenous Every 12 hours 01/09/21 0737 01/09/21 1000   01/08/21 1400  metroNIDAZOLE (FLAGYL) IVPB 500 mg  Status:  Discontinued        500 mg 100 mL/hr over 60 Minutes Intravenous Every 12 hours 01/08/21 1059 01/09/21 1000   01/08/21 1400  ceFEPIme (MAXIPIME) 2 g in sodium chloride 0.9 % 100 mL IVPB  Status:  Discontinued        2 g 200 mL/hr over 30 Minutes Intravenous Every 24 hours 01/08/21 1114 01/09/21 0737   01/08/21 1000  vancomycin (VANCOREADY) IVPB 750 mg/150 mL  Status:  Discontinued        750 mg 150 mL/hr over 60 Minutes Intravenous Every 24 hours 01/07/21 2052 01/09/21 1000   01/08/21 0600  vancomycin  (VANCOREADY) IVPB 1500 mg/300 mL  Status:  Discontinued  1,500 mg 150 mL/hr over 120 Minutes Intravenous Every 24 hours 01/07/21 0430 01/07/21 1210   01/08/21 0600  vancomycin (VANCOREADY) IVPB 750 mg/150 mL  Status:  Discontinued        750 mg 150 mL/hr over 60 Minutes Intravenous Every 24 hours 01/07/21 1210 01/07/21 2052   01/07/21 1400  piperacillin-tazobactam (ZOSYN) IVPB 3.375 g  Status:  Discontinued        3.375 g 12.5 mL/hr over 240 Minutes Intravenous Every 8 hours 01/07/21 0430 01/08/21 1114   01/07/21 0530  piperacillin-tazobactam (ZOSYN) IVPB 3.375 g        3.375 g 100 mL/hr over 30 Minutes Intravenous STAT 01/07/21 0430 01/07/21 0622   01/07/21 0530  vancomycin (VANCOREADY) IVPB 1750 mg/350 mL        1,750 mg 175 mL/hr over 120 Minutes Intravenous  Once 01/07/21 0430 01/07/21 0841   01/05/21 1430  Ampicillin-Sulbactam (UNASYN) 3 g in sodium chloride 0.9 % 100 mL IVPB        3 g 200 mL/hr over 30 Minutes Intravenous Every 6 hours 01/05/21 1334 01/06/21 1552   01/01/21 2200  piperacillin-tazobactam (ZOSYN) IVPB 3.375 g        3.375 g 12.5 mL/hr over 240 Minutes Intravenous Every 8 hours 01/01/21 1841 01/04/21 2359   01/01/21 2100  piperacillin-tazobactam (ZOSYN) IVPB 3.375 g  Status:  Discontinued        3.375 g 100 mL/hr over 30 Minutes Intravenous Every 8 hours 01/01/21 1836 01/01/21 1841   01/01/21 1345  piperacillin-tazobactam (ZOSYN) IVPB 3.375 g        3.375 g 100 mL/hr over 30 Minutes Intravenous  Once 01/01/21 1337 01/01/21 1430        Assessment/Plan POD 38, s/p exploratory laparotomy, ileocecectomy, drainage of intraabdominal abscess 01/01/21 Dr. Grandville Silos for perforated appendicitis with abscess - Drain removed 10/24 - d/c vac 11/4. Daily wet to dry dressing changes to midline abdominal wound - Continue PT/OT, SNF. Ok for discharge from surgical standpoint once dispo arranged - on a diet and having bowel function but not eating much. Continue bowel  regimen. Encourage PO intake   POD 32, s/p ex lap, splenectomy, application of incisional wound vac 10/14. Dr. Bobbye Morton for splenic laceration - CT 10/24 with complex LUQ fluid collection - s/p IR drain 10/25 yielding dark bloody fluid, culture with E coli resistant to unasyn - switched to Rocephin/ flagyl 10/27.  - post-splenectomy vaccines given 10/28 - CT scan 10/31 showed improving LUQ fluid collection with drain in place, no new acute findings  - CT 11/15 with resolved LUQ collection - recommend having IR eval for drain removal Awaiting bed at SNF, peer to peer for LTACH denied 11/2 Palliative team is following. "If further decline per family and patient transition to full comfort care."   Fever - no clear source yet , BLE [place] negative for DVT, ordered BUE [place] given recent PICC. No apparent abdominal source. CXR with improvement from previous. Fever curve better this AM and Tmax is 100.6   FEN: TNA stopped 10/30, reg diet VTE: SQH  ID: Zosyn 10/9>10/20, unasyn 10/25>10/27, vancomycin 10/26>10/27 rocephin/flagyl 10/27>11/14   Below per primary team: hemorrhagic shock 2/2 above s/p 6 u PRBC, 2 u FFP 10/14 Septic shock 2/2 perforated appendicitis  EtOH abuse Alcohol related cardiomyopathy T2DM Chronic anemia Hx of TIA HTN HLD Gout Hx of GIB CAD Tobacco abuse L pleural effusion s/p thoracentesis 10/25  LOS: 38 days    Norm Parcel, PA-C [facility] 02/08/2021, 9:15 AM Please see Amion for pager number during day hours 7:00am-4:30pm

## 2021-02-08 NOTE — Consult Note (Addendum)
[facility]                                                                                        Patient Identification: Patient Name: Travis [name] MRN: [id number] [place] Date: 01/01/2021 10:26 AM Today's Date: 02/08/2021 Reason for consult: ruptured appendix Requesting provider: Royanne Foots  Principal Problem:   Ruptured appendicitis Active Problems:   Diabetes ([place])   HLD (hyperlipidemia)   Gout   Chronic systolic heart failure ([place])   CRA (central retinal artery occlusion)   Essential hypertension   COPD (chronic obstructive pulmonary disease) ([place])   CKD (chronic kidney disease), stage III ([place])   Sepsis ([place])   Status post surgery   Malnutrition of moderate degree   Antibiotics: Pip/tazo 10/9 - 10/12, 10/14-10/15, 10/17-10/19, 11/14-current                     Unasyn 10/13-10/14, 10/25-10/26                    Vancomycin 10/14-10/17, 10/25-10/26, 11/14-current                    Ceftriaxone 10/27-11/14                    Metronidazole 10/27-11/14  Lines/Hardware: left abdominal drain +  Assessment # Fevers: No leukocytosis No respiratory/GI symptoms. No rashes/AKI. Left arm PICC line removed. Bilateral Duplex of LE negative. Chest xray with improving findings. CT abdomen/pelvis unremarkable. Liver enzymes WNL. Blood cx no growth in 1 day.   # Perforated appendicitis s/p diagnostic lap , exp lap, ilececectomy, drainage of intraabdominal abscess ( 10/9, OR cx E coli and enetrococcus durans) followed by exp lap, splenectomy with wound vac placement on 10/14 for spleen laceration complicated with Complex LUQ fluid collection ( s/p IR drain 10/25, E coli resistant to multiple abtx) and left thoracentesis 10/25 ( cx no growth)  # Splenectomy: post splenectomy vaccines received on 10/28 per notes # Staph epidermidis : 1/4 bottles, likely a contaminant, has RT hip arthroplasty and  Rt shoulder arthroplasty( no issues )   Recommendations  [place] of UE is positive for acute deep vein thrombosis involving the left  brachial veings ( site of previous PICC). Likely source of fever. Patient has completed adequate duration of tx for intraabdominal infective process which has radiographically resolved with no infective concerns otherwise. E coli isolated from abscess cx 10/25 does not appear to be ESBL based on resistance profile. No indication of abtx. ? Need to anticoagulate for DVT per primary ? Needs abdominal drain removal - defer to IR ID will sign off. Please call with questions.   Rest of the management as per the primary team.  Thank you for the consult  Rosiland Oz, MD Infectious Disease Physician [facility] [address] [place], [place] [address] Phone: [phone]  Fax: [phone]  __________________________________________________________________________________________________________ HPI and Hospital Course: 65 Y O male with PMH of HFrEF, CKD, DM2, COPD, HTN, HLD, non obstructive CAD ,alcohol use who presented to the ED on  10/9 with rt lower quadrant pain. At ED septic picture, CT abdomen/pelvis concerning for acute appendicitis  with perforation with ileal mucosal thickening with concerns of mucosal tumor.Underwent Diagnostic lap , exp lap, ilececectomy, drainage of intraabdominal abscess on 10/9 with OR findings of purulent and feculent peritonitis, necrotic base of appendix with frank perforation. Hospital course was complicated by ICU stay/septic, hemorrhagic shock requiring pressors and eventually underwent exp lap, splenectomy with wound vac placement on 10/14 for spleen laceration/hematoma/possible abscess. Patient extubated on 10/16. Also had left thoracentesis for left pleural effusion and LUQ drain placed on 10/25 for possible splenic hematoma/abscess   Pertinent Microbiology  Blood cx 10/9 1/4  bottles bacteroides ( not fragilis) Abdominal fluid cx 10/9 E coli and Enterococcus durans  Blood cx 10/25 1/4 bottles staph epidermidis  Abscess 10/25 cx E coli ( R to ampicillin, amp sulbactam, ciprofloxacin and Bactrim)  Left Pleural fluid cx No growth   ID consulted for fevers. Patient complains of fever, denies any chills and sweats. Denies any cough, chest pain and SOB. Denies nausea, vomiting, abdominal pain and diarrhea. Denies GU symptoms, rashes or joint pain. He is tolerating PO diet and eager to go home. Has quit smoking, alcohol a long time ago.   ROS: General- Denies chills, loss of appetite and loss of weight, fevers+ HEENT - Denies headache, blurry vision, neck pain, sinus pain Chest - Denies any chest pain, SOB or cough CVS- Denies any dizziness/lightheadedness, syncopal attacks, palpitations Abdomen- Denies any nausea, vomiting, abdominal pain, hematochezia and diarrhea Neuro - Denies any weakness, numbness, tingling sensation Psych - Denies any changes in mood irritability or depressive symptoms GU- Denies any burning, dysuria, hematuria or increased frequency of urination Skin - denies any rashes/lesions MSK - denies any joint pain/swelling or restricted ROM    Past Medical History:  Diagnosis Date   Alcohol abuse    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 05/2019 Echo: EF 35-40%; b. 10/2019 Echo: EF 45-50%.   Allergy    Asthma    Central retinal artery occlusion of left eye 09/13/13   Chronic anemia    Clotting disorder ([place])    Diabetes mellitus, type 2 ([place])    pt reports his DM is gone   GI bleed    15 years ago   Gout    Hemoptysis    secondary to pulmonary edema   HFimpEF (heart failure with improved ejection fraction) ([place])    a. 12/2010 Echo: EF 20-25%; b 08/2013 Echo: EF 45-50%; c. 01/2015 Echo: EF 25-30%; d. 07/2016 Echo: EF 35-40%; e. 05/2019 Echo: EF 35-40%; d. 10/2019 Echo: EF 45-50%, mild LVH, mild red RV fxn, mildl dil RA, Triv MR. Mild to mod  Ao Sclerosis w/o stenosis.   Hyperlipidemia    Hypertension    Nonischemic cardiomyopathy (HCC)    Nonobstructive Coronary artery disease    a. 01/2015 Cath: LM nl, LAD 30p/m, LCX nl, RCA 10p/m, RPDA min irregs.   Osteoarthritis    Pneumonia    TIA (transient ischemic attack)    Tobacco abuse    Vision loss    peripherial vision only left eye.Central Retinal  artery occusion    Past Surgical History:  Procedure Laterality Date   BIOPSY  11/25/2019   Procedure: BIOPSY;  Surgeon: Ladene Artist, MD;  Location[facility] END[facility]COPY;  Service: Endoscopy;;   CARDIAC CATHETERIZATION     CARDIAC CATHETERIZATION N/A 01/28/2015   Procedure: Left Heart Cath and Coronary Angiography;  Surgeon: Wellington Hampshire,  MD;  Location: [place] CV LAB;  Service: Cardiovascular;  Laterality: N/A;   COLONOSCOPY     ESOPHAGOGASTRODUODENOSCOPY (EGD) WITH PROPOFOL N/A 11/25/2019   Procedure: ESOPHAGOGASTRODUODENOSCOPY (EGD) WITH PROPOFOL;  Surgeon: Ladene Artist, MD;  Location: [facility] ENDOSCOPY;  Service: Endoscopy;  Laterality: N/A;   HIP ARTHROPLASTY Right 03/15/2013   Procedure: ARTHROPLASTY BIPOLAR HIP;  Surgeon: [name], MD;  Location: [place];  Service: Orthopedics;  Laterality: Right;   LAPAROSCOPIC APPENDECTOMY N/A 01/01/2021   Procedure: DIAGNOSTIC LAPAROSCOPY; EXPLORATORY LAPAROTOMY, ILEUCECECTOMY, DRAINAGE OF ABDOMINAL ABSCESS;  Surgeon: Georganna Skeans, MD;  Location: [place];  Service: General;  Laterality: N/A;   SPLENECTOMY, TOTAL N/A 01/06/2021   Procedure: SPLENECTOMY;  Surgeon: Jesusita Oka, MD;  Location: [place] [facility];  Service: General;  Laterality: N/A;   TOTAL SHOULDER ARTHROPLASTY Right 09/01/2015   Procedure: RIGHT TOTAL SHOULDER ARTHROPLASTY;  Surgeon: [name], MD;  Location: [place];  Service: Orthopedics;  Laterality: Right;     Scheduled Meds:  (feeding supplement) PROSource Plus  30 mL Oral Daily   acetaminophen  1,000 mg Oral Q6H   bisacodyl  10 mg Rectal Once    Chlorhexidine Gluconate Cloth  6 each Topical Daily   docusate sodium  100 mg Oral BID   enoxaparin (LOVENOX) injection  40 mg Subcutaneous Q24H   feeding supplement  1 Container Oral Q24H   feeding supplement  237 mL Oral Q24H    HYDROmorphone (DILAUDID) injection  0.5 mg Intravenous Once   insulin aspart  0-9 Units Subcutaneous TID WC   mouth rinse  15 mL Mouth Rinse BID   methocarbamol  500 mg Oral QID   metoprolol tartrate  50 mg Oral BID   mirtazapine  30 mg Oral QHS   multivitamin with minerals  1 tablet Oral Daily   oxyCODONE  20 mg Oral Q12H   pantoprazole  40 mg Oral Daily   Continuous Infusions:  sodium chloride 10 mL/hr at 01/29/21 0610   PRN Meds:.sodium chloride, bisacodyl, dextromethorphan, HYDROmorphone (DILAUDID) injection, hydroxypropyl methylcellulose / hypromellose, ipratropium-albuterol, ondansetron (ZOFRAN) IV, oxyCODONE, phenol, simethicone, sodium chloride, sodium chloride flush  No Known Allergies  Social History   Socioeconomic History   Marital status: Single    Spouse name: Not on file   Number of children: 0   Years of education: 12   Highest education level: High school graduate  Occupational History   Occupation: Retired  Tobacco Use   Smoking status: Every Day    Packs/day: 0.75    Years: 30.00    Pack years: 22.50    Types: Cigarettes   Smokeless tobacco: Never  Vaping Use   Vaping Use: Never used  Substance and Sexual Activity   Alcohol use: Not Currently    Alcohol/week: 1.0 standard drink    Types: 1 Glasses of wine per week    Comment: occasionally   Drug use: Yes    Types: Marijuana   Sexual activity: Yes    Birth control/protection: Condom  Other Topics Concern   Not on file  Social History Narrative   Not on file   Social Determinants of Health   Financial Resource Strain: Not on file  Food Insecurity: No Food Insecurity   Worried About Charity fundraiser in the Last Year: Never true   Ran Out of Food in the Last  Year: Never true  Transportation Needs: Not on file  Physical Activity: Not on file  Stress: Not on file  Social Connections: Socially  Isolated   Frequency of Communication with Friends and Family: More than three times a week   Frequency of Social Gatherings with Friends and Family: More than three times a week   Attends Religious Services: Never   Marine scientist or Organizations: No   Attends Music therapist: Never   Marital Status: Never married  Human resources officer Violence: Not on file   Family History  Problem Relation Age of Onset   Diabetes Mother    Hypertension Mother    Diabetes Father    Hypertension Father    Diabetes Sister    Dementia Brother    Cancer Neg Hx    Heart disease Neg Hx    Stroke Neg Hx    Colon cancer Neg Hx    Esophageal cancer Neg Hx    Rectal cancer Neg Hx    Stomach cancer Neg Hx    Vitals BP 117/79 (BP Location: Right Arm)   Pulse 88   Temp 98.3 F (36.8 C) (Oral)   Resp 17   Ht 6' (1.829 m)   Wt 78.7 kg   SpO2 99%   BMI 23.53 kg/m    Physical Exam Constitutional:  Lying in bed, appears comfortable     Comments:   Cardiovascular:     Rate and Rhythm: Normal rate and regular rhythm.     Heart sounds:   Pulmonary:     Effort: Pulmonary effort is normal.     Comments: clear air entry bilaterally   Abdominal:     Palpations: Abdomen is soft.     Tenderness: midline bandage and LUQ drainage catheter with a bulb with muddy colored fluid   Musculoskeletal:        General: No swelling or tenderness.   Skin:    Comments: No lesions or rashes   Neurological:     General: No focal deficit present.   Psychiatric:        Mood and Affect: Mood normal.   Pertinent Microbiology Results for orders placed or performed during the hospital encounter of 01/01/21  Blood Culture (routine x 2)     Status: Abnormal   Collection Time: 01/01/21  1:40 PM   Specimen: BLOOD  Result Value Ref Range Status   Specimen  Description BLOOD RIGHT ANTECUBITAL  Final   Special Requests   Final    BOTTLES DRAWN AEROBIC AND ANAEROBIC Blood Culture adequate volume   Culture  Setup Time   Final    GRAM NEGATIVE RODS ANAEROBIC BOTTLE ONLY CRITICAL RESULT CALLED TO, READ BACK BY AND VERIFIED WITH: PHARMD A.LOWLES AT 7106 ON 01/06/2021 BY T.SAAD.    Culture (A)  Final    BACTEROIDES SPECIES NOT FRAGILIS BETA LACTAMASE POSITIVE Performed at [facility], [place] [address] [place], [place] [address]    Report Status 01/07/2021 FINAL  Final  Blood Culture (routine x 2)     Status: None   Collection Time: 01/01/21  2:00 PM   Specimen: BLOOD LEFT FOREARM  Result Value Ref Range Status   Specimen Description BLOOD LEFT FOREARM  Final   Special Requests   Final    BOTTLES DRAWN AEROBIC AND ANAEROBIC Blood Culture adequate volume   Culture   Final    NO GROWTH 5 DAYS Performed at [facility], [place] [address] [place],  [address]    Report Status 01/06/2021 FINAL  Final  Resp Panel by RT-PCR (Flu A&B, Covid)     Status: None  Collection Time: 01/01/21  5:53 PM   Specimen: Nasopharyngeal(NP) swabs in vial transport medium  Result Value Ref Range Status   SARS Coronavirus 2 by RT PCR NEGATIVE NEGATIVE Final    Comment: (NOTE) SARS-CoV-2 target nucleic acids are NOT DETECTED.  The SARS-CoV-2 RNA is generally detectable in upper respiratory specimens during the acute phase of infection. The lowest concentration of SARS-CoV-2 viral copies this assay can detect is 138 copies/mL. A negative result does not preclude SARS-Cov-2 infection and should not be used as the sole basis for treatment or other patient management decisions. A negative result may occur with  improper specimen collection/handling, submission of specimen other than nasopharyngeal swab, presence of viral mutation(s) within the areas targeted by this assay, and inadequate number of viral copies(<138 copies/mL). A negative result must  be combined with clinical observations, patient history, and epidemiological information. The expected result is Negative.  Fact Sheet for Patients:  EntrepreneurPulse.com.au  Fact Sheet for Healthcare Providers:  IncredibleEmployment.be  This test is no t yet approved or cleared by the [place] FDA and  has been authorized for detection and/or diagnosis of SARS-CoV-2 by FDA under an Emergency Use Authorization (EUA). This EUA will remain  in effect (meaning this test can be used) for the duration of the COVID-19 declaration under Section 564(b)(1) of the Act, 21 U.S.C.section 360bbb-3(b)(1), unless the authorization is terminated  or revoked sooner.       Influenza A by PCR NEGATIVE NEGATIVE Final   Influenza B by PCR NEGATIVE NEGATIVE Final    Comment: (NOTE) The Xpert Xpress SARS-CoV-2/FLU/RSV plus assay is intended as an aid in the diagnosis of influenza from Nasopharyngeal swab specimens and should not be used as a sole basis for treatment. Nasal washings and aspirates are unacceptable for Xpert Xpress SARS-CoV-2/FLU/RSV testing.  Fact Sheet for Patients: EntrepreneurPulse.com.au  Fact Sheet for Healthcare Providers: IncredibleEmployment.be  This test is not yet approved or cleared by the [place] FDA and has been authorized for detection and/or diagnosis of SARS-CoV-2 by FDA under an Emergency Use Authorization (EUA). This EUA will remain in effect (meaning this test can be used) for the duration of the COVID-19 declaration under Section 564(b)(1) of the Act, 21 U.S.C. section 360bbb-3(b)(1), unless the authorization is terminated or revoked.  Performed at [facility], [place] [address], [place], [place] [address]   Aerobic Culture w Gram Stain (superficial specimen)     Status: None   Collection Time: 01/01/21  9:20 PM   Specimen: Abdomen; Abscess  Result Value Ref Range Status    Specimen Description FLUID ABDOMEN  Final   Special Requests NONE  Final   Gram Stain   Final    NO SQUAMOUS EPITHELIAL CELLS SEEN FEW WBC SEEN FEW GRAM POSITIVE RODS FEW GRAM POSITIVE COCCI MODERATE GRAM NEGATIVE RODS Performed at [facility], [place] [address] [place], [place] [address]    Culture   Final    ABUNDANT ESCHERICHIA COLI FEW ENTEROCOCCUS DURANS    Report Status 01/05/2021 FINAL  Final   Organism ID, Bacteria ESCHERICHIA COLI  Final   Organism ID, Bacteria ENTEROCOCCUS DURANS  Final      Susceptibility   Escherichia coli - MIC*    AMPICILLIN 4 SENSITIVE Sensitive     CEFAZOLIN <=4 SENSITIVE Sensitive     CEFEPIME <=0.12 SENSITIVE Sensitive     CEFTAZIDIME <=1 SENSITIVE Sensitive     CEFTRIAXONE <=0.25 SENSITIVE Sensitive     CIPROFLOXACIN <=0.25 SENSITIVE Sensitive  GENTAMICIN <=1 SENSITIVE Sensitive     IMIPENEM <=0.25 SENSITIVE Sensitive     TRIMETH/SULFA <=20 SENSITIVE Sensitive     AMPICILLIN/SULBACTAM <=2 SENSITIVE Sensitive     PIP/TAZO <=4 SENSITIVE Sensitive     * ABUNDANT ESCHERICHIA COLI   Enterococcus durans - MIC*    AMPICILLIN <=2 SENSITIVE Sensitive     VANCOMYCIN <=0.5 SENSITIVE Sensitive     GENTAMICIN SYNERGY SENSITIVE Sensitive     * FEW ENTEROCOCCUS DURANS  MRSA Next Gen by PCR, Nasal     Status: None   Collection Time: 01/01/21 11:39 PM   Specimen: Nasal Mucosa; Nasal Swab  Result Value Ref Range Status   MRSA by PCR Next Gen NOT DETECTED NOT DETECTED Final    Comment: (NOTE) The GeneXpert MRSA Assay (FDA approved for NASAL specimens only), is one component of a comprehensive MRSA colonization surveillance program. It is not intended to diagnose MRSA infection nor to guide or monitor treatment for MRSA infections. Test performance is not FDA approved in patients less than 90 years old. Performed at [facility], [place] [address], [place], [place] [address]   Urine Culture     Status: None   Collection Time: 01/02/21   3:21 AM   Specimen: In/Out Cath Urine  Result Value Ref Range Status   Specimen Description IN/OUT CATH URINE  Final   Special Requests NONE  Final   Culture   Final    NO GROWTH Performed at [facility], [place] [address], [place], [place] [address]    Report Status 01/03/2021 FINAL  Final  MRSA Next Gen by PCR, Nasal     Status: None   Collection Time: 01/07/21  8:38 AM   Specimen: Nasal Mucosa; Nasal Swab  Result Value Ref Range Status   MRSA by PCR Next Gen NOT DETECTED NOT DETECTED Final    Comment: (NOTE) The GeneXpert MRSA Assay (FDA approved for NASAL specimens only), is one component of a comprehensive MRSA colonization surveillance program. It is not intended to diagnose MRSA infection nor to guide or monitor treatment for MRSA infections. Test performance is not FDA approved in patients less than 9 years old. Performed at [facility], [place] [address] [place], [place] [address]   Culture, blood (routine x 2)     Status: None   Collection Time: 01/09/21 10:19 AM   Specimen: BLOOD LEFT HAND  Result Value Ref Range Status   Specimen Description BLOOD LEFT HAND  Final   Special Requests   Final    BOTTLES DRAWN AEROBIC AND ANAEROBIC Blood Culture adequate volume   Culture   Final    NO GROWTH 5 DAYS Performed at [facility], [place] [address], [place], [place] [address]    Report Status 01/14/2021 FINAL  Final  Culture, blood (routine x 2)     Status: None   Collection Time: 01/09/21 10:30 AM   Specimen: BLOOD LEFT HAND  Result Value Ref Range Status   Specimen Description BLOOD LEFT HAND  Final   Special Requests   Final    BOTTLES DRAWN AEROBIC ONLY Blood Culture results may not be optimal due to an inadequate volume of blood received in culture bottles   Culture   Final    NO GROWTH 5 DAYS Performed at [facility], [place] [address] [place], [place] [address]    Report Status 01/14/2021 FINAL  Final  Culture, blood (routine x 2)  Status: None   Collection Time: 01/17/21  4:24 AM   Specimen: BLOOD RIGHT HAND  Result Value Ref Range Status   Specimen Description BLOOD RIGHT HAND  Final   Special Requests   Final    BOTTLES DRAWN AEROBIC ONLY Blood Culture adequate volume   Culture   Final    NO GROWTH 5 DAYS Performed at [facility], [address], [place], [place] [address]    Report Status 01/22/2021 FINAL  Final  Culture, blood (routine x 2)     Status: Abnormal   Collection Time: 01/17/21  4:25 AM   Specimen: BLOOD RIGHT HAND  Result Value Ref Range Status   Specimen Description BLOOD RIGHT HAND  Final   Special Requests   Final    BOTTLES DRAWN AEROBIC ONLY Blood Culture adequate volume   Culture  Setup Time   Final    GRAM POSITIVE COCCI AEROBIC BOTTLE ONLY CRITICAL RESULT CALLED TO, READ BACK BY AND VERIFIED WITH: PHARMD JAMES LEDFORD 01/18/21@4 :42 BY TW    Culture (A)  Final    STAPHYLOCOCCUS EPIDERMIDIS THE SIGNIFICANCE OF ISOLATING THIS ORGANISM FROM A SINGLE SET OF BLOOD CULTURES WHEN MULTIPLE SETS ARE DRAWN IS UNCERTAIN. PLEASE NOTIFY THE MICROBIOLOGY DEPARTMENT WITHIN ONE WEEK IF SPECIATION AND SENSITIVITIES ARE REQUIRED. Performed at [facility], [place] [address] [place], [place] [address]    Report Status 01/19/2021 FINAL  Final  Blood Culture ID Panel (Reflexed)     Status: Abnormal   Collection Time: 01/17/21  4:25 AM  Result Value Ref Range Status   Enterococcus faecalis NOT DETECTED NOT DETECTED Final   Enterococcus Faecium NOT DETECTED NOT DETECTED Final   Listeria monocytogenes NOT DETECTED NOT DETECTED Final   Staphylococcus species DETECTED (A) NOT DETECTED Final    Comment: CRITICAL RESULT CALLED TO, READ BACK BY AND VERIFIED WITH: PHARMD JAMES LEDFORD 01/18/21@4 :42 BY TW    Staphylococcus aureus (BCID) NOT DETECTED NOT DETECTED Final   Staphylococcus epidermidis DETECTED (A) NOT DETECTED Final    Comment: Methicillin (oxacillin) resistant coagulase negative  staphylococcus. Possible blood culture contaminant (unless isolated from more than one blood culture draw or clinical case suggests pathogenicity). No antibiotic treatment is indicated for blood  culture contaminants. CRITICAL RESULT CALLED TO, READ BACK BY AND VERIFIED WITH: PHARMD JAMES LEDFORD 01/18/21@4 :42 BY TW    Staphylococcus lugdunensis NOT DETECTED NOT DETECTED Final   Streptococcus species NOT DETECTED NOT DETECTED Final   Streptococcus agalactiae NOT DETECTED NOT DETECTED Final   Streptococcus pneumoniae NOT DETECTED NOT DETECTED Final   Streptococcus pyogenes NOT DETECTED NOT DETECTED Final   A.calcoaceticus-baumannii NOT DETECTED NOT DETECTED Final   Bacteroides fragilis NOT DETECTED NOT DETECTED Final   Enterobacterales NOT DETECTED NOT DETECTED Final   Enterobacter cloacae complex NOT DETECTED NOT DETECTED Final   Escherichia coli NOT DETECTED NOT DETECTED Final   Klebsiella aerogenes NOT DETECTED NOT DETECTED Final   Klebsiella oxytoca NOT DETECTED NOT DETECTED Final   Klebsiella pneumoniae NOT DETECTED NOT DETECTED Final   Proteus species NOT DETECTED NOT DETECTED Final   Salmonella species NOT DETECTED NOT DETECTED Final   Serratia marcescens NOT DETECTED NOT DETECTED Final   Haemophilus influenzae NOT DETECTED NOT DETECTED Final   Neisseria meningitidis NOT DETECTED NOT DETECTED Final   Pseudomonas aeruginosa NOT DETECTED NOT DETECTED Final   Stenotrophomonas maltophilia NOT DETECTED NOT DETECTED Final   Candida albicans NOT DETECTED NOT DETECTED Final   Candida auris NOT DETECTED NOT DETECTED Final  Candida glabrata NOT DETECTED NOT DETECTED Final   Candida krusei NOT DETECTED NOT DETECTED Final   Candida parapsilosis NOT DETECTED NOT DETECTED Final   Candida tropicalis NOT DETECTED NOT DETECTED Final   Cryptococcus neoformans/gattii NOT DETECTED NOT DETECTED Final   Methicillin resistance mecA/C DETECTED (A) NOT DETECTED Final    Comment: CRITICAL RESULT CALLED  TO, READ BACK BY AND VERIFIED WITH: PHARMD JAMES LEDFORD 01/18/21@4 :42 BY TW Performed at [place] [address], [place], [place] [address]   Aerobic/Anaerobic Culture w Gram Stain (surgical/deep wound)     Status: None   Collection Time: 01/17/21  5:15 PM   Specimen: Abscess  Result Value Ref Range Status   Specimen Description ABSCESS  Final   Special Requests NONE  Final   Gram Stain   Final    ABUNDANT WBC PRESENT, PREDOMINANTLY PMN ABUNDANT GRAM NEGATIVE RODS    Culture   Final    ABUNDANT ESCHERICHIA COLI NO ANAEROBES ISOLATED Performed at [facility], [place] [address] [place], [place] [address]    Report Status 01/30/2021 FINAL  Final   Organism ID, Bacteria ESCHERICHIA COLI  Final      Susceptibility   Escherichia coli - MIC*    AMPICILLIN >=32 RESISTANT Resistant     CEFAZOLIN 16 SENSITIVE Sensitive     CEFEPIME <=0.12 SENSITIVE Sensitive     CEFTAZIDIME <=1 SENSITIVE Sensitive     CEFTRIAXONE <=0.25 SENSITIVE Sensitive     CIPROFLOXACIN >=4 RESISTANT Resistant     GENTAMICIN <=1 SENSITIVE Sensitive     IMIPENEM <=0.25 SENSITIVE Sensitive     TRIMETH/SULFA >=320 RESISTANT Resistant     AMPICILLIN/SULBACTAM >=32 RESISTANT Resistant     PIP/TAZO >=128 RESISTANT Resistant     * ABUNDANT ESCHERICHIA COLI  Culture, body fluid w Gram Stain-bottle     Status: None   Collection Time: 01/17/21  5:16 PM   Specimen: Fluid  Result Value Ref Range Status   Specimen Description FLUID PLEURAL LEFT  Final   Special Requests BOTTLES DRAWN AEROBIC AND ANAEROBIC  Final   Culture   Final    NO GROWTH 5 DAYS Performed at [facility] [address] [place], [place] [address]    Report Status 01/22/2021 FINAL  Final  Gram stain     Status: None   Collection Time: 01/17/21  5:16 PM   Specimen: Fluid  Result Value Ref Range Status   Specimen Description FLUID PLEURAL  Final   Special Requests NONE  Final   Gram Stain   Final    NO WBC SEEN NO  ORGANISMS SEEN Performed at [facility], [place] [address] [place], [place] [address]    Report Status 01/18/2021 FINAL  Final  Resp Panel by RT-PCR (Flu A&B, Covid) Nasopharyngeal Swab     Status: None   Collection Time: 02/03/21 11:13 AM   Specimen: Nasopharyngeal Swab; Nasopharyngeal(NP) swabs in vial transport medium  Result Value Ref Range Status   SARS Coronavirus 2 by RT PCR NEGATIVE NEGATIVE Final    Comment: (NOTE) SARS-CoV-2 target nucleic acids are NOT DETECTED.  The SARS-CoV-2 RNA is generally detectable in upper respiratory specimens during the acute phase of infection. The lowest concentration of SARS-CoV-2 viral copies this assay can detect is 138 copies/mL. A negative result does not preclude SARS-Cov-2 infection and should not be used as the sole basis for treatment or other patient management decisions. A negative result may occur with  improper  specimen collection/handling, submission of specimen other than nasopharyngeal swab, presence of viral mutation(s) within the areas targeted by this assay, and inadequate number of viral copies(<138 copies/mL). A negative result must be combined with clinical observations, patient history, and epidemiological information. The expected result is Negative.  Fact Sheet for Patients:  EntrepreneurPulse.com.au  Fact Sheet for Healthcare Providers:  IncredibleEmployment.be  This test is no t yet approved or cleared by the [place] FDA and  has been authorized for detection and/or diagnosis of SARS-CoV-2 by FDA under an Emergency Use Authorization (EUA). This EUA will remain  in effect (meaning this test can be used) for the duration of the COVID-19 declaration under Section 564(b)(1) of the Act, 21 U.S.C.section 360bbb-3(b)(1), unless the authorization is terminated  or revoked sooner.       Influenza A by PCR NEGATIVE NEGATIVE Final   Influenza B by PCR NEGATIVE NEGATIVE Final     Comment: (NOTE) The Xpert Xpress SARS-CoV-2/FLU/RSV plus assay is intended as an aid in the diagnosis of influenza from Nasopharyngeal swab specimens and should not be used as a sole basis for treatment. Nasal washings and aspirates are unacceptable for Xpert Xpress SARS-CoV-2/FLU/RSV testing.  Fact Sheet for Patients: EntrepreneurPulse.com.au  Fact Sheet for Healthcare Providers: IncredibleEmployment.be  This test is not yet approved or cleared by the [place] FDA and has been authorized for detection and/or diagnosis of SARS-CoV-2 by FDA under an Emergency Use Authorization (EUA). This EUA will remain in effect (meaning this test can be used) for the duration of the COVID-19 declaration under Section 564(b)(1) of the Act, 21 U.S.C. section 360bbb-3(b)(1), unless the authorization is terminated or revoked.  Performed at [facility], [place] [address] [place], [place] [address]   Culture, blood (routine x 2)     Status: None (Preliminary result)   Collection Time: 02/07/21  2:45 AM   Specimen: BLOOD  Result Value Ref Range Status   Specimen Description BLOOD RIGHT ARM  Final   Special Requests   Final    BOTTLES DRAWN AEROBIC ONLY Blood Culture adequate volume   Culture   Final    NO GROWTH 1 DAY Performed at [facility], [place] [address], [place], [place] [address]    Report Status PENDING  Incomplete  Culture, blood (routine x 2)     Status: None (Preliminary result)   Collection Time: 02/07/21  2:45 AM   Specimen: BLOOD RIGHT WRIST  Result Value Ref Range Status   Specimen Description BLOOD RIGHT WRIST  Final   Special Requests   Final    BOTTLES DRAWN AEROBIC ONLY Blood Culture adequate volume   Culture   Final    NO GROWTH 1 DAY Performed at [facility], [place] [address] [place], [place] [address]    Report Status PENDING  Incomplete   Pertinent Lab seen by me: CBC Latest Ref Rng & Units 02/08/2021  01/29/2021 01/28/2021  WBC 4.0 - 10.5 K/uL 8.2 13.7(H) 11.1(H)  Hemoglobin 13.0 - 17.0 g/dL 8.6(L) 8.8(L) 8.8(L)  Hematocrit 39.0 - 52.0 % 28.2(L) 28.6(L) 28.8(L)  Platelets 150 - 400 K/uL 521(H) 583(H) 592(H)   CMP Latest Ref Rng & Units 02/08/2021 02/07/2021 01/27/2021  Glucose 70 - 99 mg/dL 97 99 115(H)  BUN 8 - 23 mg/dL 16 20 23   Creatinine 0.61 - 1.24 mg/dL 1.08 1.03 1.10  Sodium 135 - 145 mmol/L 137 136 138  Potassium 3.5 - 5.1 mmol/L 4.0 3.9 3.8  Chloride 98 -  111 mmol/L 107 103 104  CO2 22 - 32 mmol/L 22 22 25   Calcium 8.9 - 10.3 mg/dL 9.3 9.4 9.8  Total Protein 6.5 - 8.1 g/dL 6.9 - 6.8  Total Bilirubin 0.3 - 1.2 mg/dL 0.6 - 1.1  Alkaline Phos 38 - 126 U/L 71 - 141(H)  AST 15 - 41 U/L 21 - 25  ALT 0 - 44 U/L 13 - 24     Pertinent Imagings/Other Imagings Plain films and CT images have been personally visualized and interpreted; radiology reports have been reviewed. Decision making incorporated into the Impression / Recommendations.  CT ABDOMEN PELVIS WO CONTRAST  Result Date: 02/07/2021 CLINICAL DATA:  Left upper quadrant abscess status post percutaneous drainage. Decreased drain output. EXAM: CT ABDOMEN AND PELVIS WITHOUT CONTRAST TECHNIQUE: Multidetector CT imaging of the abdomen and pelvis was performed following the standard protocol without IV contrast. COMPARISON:  None. FINDINGS: Lower chest: Small left pleural effusion is unchanged. Bibasilar atelectasis with subtotal collapse of the left lower lobe is again noted. Central venous catheter tip noted within the right atrium. Cardiac size within normal limits. Moderate coronary artery calcification. Hepatobiliary: Cholelithiasis without superimposed pericholecystic inflammatory change. Liver unremarkable. No intra or extrahepatic biliary ductal dilation. Pancreas: Unremarkable Spleen: Status post splenectomy. Pigtail drainage catheter is seen within the splenectomy bed. Pericatheter fluid collection noted on prior examination  has been completely evacuated. Small amount of residual pericatheter soft tissue in keeping with inflammatory tissue is noted. Adrenals/Urinary Tract: Adrenal glands are unremarkable. Kidneys are normal, without renal calculi, focal lesion, or hydronephrosis. Bladder is unremarkable. Stomach/Bowel: Status post appendectomy. Omental infiltration within the left upper quadrant and right lower quadrant is stable since prior examination, most in keeping with postsurgical change. No discrete drainable fluid collection identified within the abdomen and pelvis. The rectal vault is fluid-filled, a finding that can present clinically as diarrhea. The stomach, small bowel, and large bowel are otherwise unremarkable. No evidence of obstruction. No free intraperitoneal gas or fluid. Vascular/Lymphatic: Aortic atherosclerosis. No enlarged abdominal or pelvic lymph nodes. Reproductive: Obscured by streak artifact from right hip arthroplasty. Other: Tiny fat containing umbilical hernia noted. Musculoskeletal: Status post right total hip arthroplasty. Degenerative changes are seen within the lumbar spine. No acute bone abnormality. IMPRESSION: Left upper quadrant percutaneous drainage catheter is unchanged in position. Previously noted pericatheter fluid collection has now completely resolved with a small amount of a pericatheter inflammatory tissue remaining. No loculated intra-abdominal fluid collection identified. Small left pleural effusion, likely reactive. Bibasilar atelectasis, left greater than right. Moderate coronary artery calcification. Cholelithiasis. Infiltration of the omentum, likely postoperative in nature related to splenectomy and appendectomy. Aortic Atherosclerosis (ICD10-I70.0). Electronically Signed   By: Fidela Salisbury M.D.   On: 02/07/2021 01:26   CT ABDOMEN PELVIS WO CONTRAST  Result Date: 01/23/2021 CLINICAL DATA:  Abdominal abscess, severe abdominal pain, EXAM: CT ABDOMEN AND PELVIS WITHOUT CONTRAST  TECHNIQUE: Multidetector CT imaging of the abdomen and pelvis was performed following the standard protocol without IV contrast. COMPARISON:  None. FINDINGS: Lower chest: Small left pleural effusion. Bibasilar atelectasis, left greater than right. Moderate multi-vessel coronary artery calcification. Central venous catheter tip noted within the superior cavoatrial junction. Hepatobiliary: No focal liver abnormality is seen. No gallstones, gallbladder wall thickening, or biliary dilatation. Pancreas: Unremarkable Spleen: Status post splenectomy. Percutaneous drainage catheter is again seen within the splenectomy resection bed with mixed attenuation material surrounding the catheter likely representing hemorrhagic components or residual contrast from drainage catheter placement or evaluation. Punctate foci of gas  within this region are nonspecific and may relate to flushing of the catheter or evaluation. When measured in similar fashion, the dominant collection measures 5.5 x 7.4 cm, slightly decreased in size since prior examination. Adrenals/Urinary Tract: Adrenal glands are unremarkable. Kidneys are normal, without renal calculi, focal lesion, or hydronephrosis. Bladder is unremarkable. Stomach/Bowel: Appendectomy has been performed. Stomach, small bowel, and large bowel are otherwise unremarkable. No gross free intraperitoneal gas. Mild infiltration of the greater omentum is unchanged may reflect postsurgical or residual inflammatory change. No loculated intra-abdominal fluid collections. No free intrapelvic fluid. Vascular/Lymphatic: Aortic atherosclerosis. No enlarged abdominal or pelvic lymph nodes. Reproductive: Streak artifact from right total hip arthroplasty. Partially obscures pelvic organs. Visualized prostate gland is unremarkable. Other: Laparotomy incision with wound VAC device placed within the a superficial aspect of the incision again noted. Punctate foci of gas noted within the right lower quadrant  anterior abdominal wall. Mild subcutaneous edema noted within the flanks bilaterally in keeping with mild anasarca. Musculoskeletal: Right total hip arthroplasty has been performed. No acute bone abnormality. Degenerative changes are seen within the lumbar spine. IMPRESSION: Changes of splenectomy and left upper quadrant percutaneous drainage again identified. Slight interval decrease in size of dominant collection within the left upper quadrant. High attenuation material and punctate foci of gas are nonspecific and may relate to recent catheter evaluation and/or flushing. Postsurgical changes of appendectomy with residual postoperative edema or inflammatory change within the greater omentum. No discrete drainable fluid collection identified. Small left pleural effusion, likely reactive. Associated bibasilar compressive atelectasis, left greater than right. Aortic Atherosclerosis (ICD10-I70.0). Electronically Signed   By: Fidela Salisbury M.D.   On: 01/23/2021 01:48   CT ABDOMEN PELVIS WO CONTRAST  Result Date: 01/19/2021 CLINICAL DATA:  Abdominal pain, history of ruptured appendicitis EXAM: CT ABDOMEN AND PELVIS WITHOUT CONTRAST TECHNIQUE: Multidetector CT imaging of the abdomen and pelvis was performed following the standard protocol without IV contrast. COMPARISON:  01/16/2021 FINDINGS: Lower chest: No acute abnormality. Hepatobiliary: No solid liver abnormality is seen. Small gallstone near the gallbladder neck (series 3, image 39). Gallbladder wall thickening, or biliary dilatation. Pancreas: Unremarkable. No pancreatic ductal dilatation or surrounding inflammatory changes. Spleen: Status post splenectomy. Interval placement of percutaneous pigtail drain catheters in the left upper quadrant within an air and fluid collection, the dominant component in which the catheter is placed measuring 7.9 x 6.6 cm, previously 8.5 x 6.2 cm (series 3, image 21). Adrenals/Urinary Tract: Adrenal glands are unremarkable.  Kidneys are normal, without renal calculi, solid lesion, or hydronephrosis. Bladder is unremarkable. Stomach/Bowel: Stomach is within normal limits. Enteric contrast present in the colon. Redemonstrated postoperative findings status post appendectomy. There has been interval removal of a surgical drain in the right lower quadrant. Previously seen small, rim enhancing fluid collection in the right lower quadrant is not significantly changed, measuring 1.5 cm (series 3, image 65). Vascular/Lymphatic: Aortic atherosclerosis. No enlarged abdominal or pelvic lymph nodes. Reproductive: No mass or other significant abnormality. Other: No abdominal wall hernia or abnormality. No abdominopelvic ascites. Musculoskeletal: No acute or significant osseous findings. Status post right hip total arthroplasty. IMPRESSION: 1. Interval placement of percutaneous pigtail drain catheter in the left upper quadrant within an air and fluid collection, the dominant component in which the catheter is placed measuring 7.9 x 6.6 cm, previously 8.5 x 6.2 cm. 2. Previously seen small, rim enhancing fluid collection in the right lower quadrant is not significantly changed, measuring 1.5 cm. 3. Redemonstrated postoperative findings status post appendectomy and splenectomy.  There has been interval removal of a surgical drain in the right lower quadrant. 4. Cholelithiasis. Aortic Atherosclerosis (ICD10-I70.0). Electronically Signed   By: Delanna Ahmadi M.D.   On: 01/19/2021 12:32   DG Chest 1 View  Result Date: 01/17/2021 CLINICAL DATA:  Status post left thoracentesis. EXAM: CHEST  1 VIEW COMPARISON:  Chest x-ray obtained earlier today FINDINGS: No evidence of pneumothorax. Reduced left pleural effusion. New percutaneous drainage catheter projects over the left upper quadrant. Left upper extremity PICC. Catheter tip terminates over the right atrium. Very low lung volumes. Pulmonary vascular congestion. IMPRESSION: No evidence of pneumothorax or  other complication following left-sided thoracentesis. Electronically Signed   By: Jacqulynn Cadet M.D.   On: 01/17/2021 16:33   DG Chest 1 View  Result Date: 01/17/2021 CLINICAL DATA:  Shortness of breath. EXAM: CHEST  1 VIEW COMPARISON:  January 16, 2021. FINDINGS: Stable cardiomediastinal silhouette. Left-sided PICC line is unchanged in position. Large left lung opacity is noted concerning for pneumonia or atelectasis with associated pleural effusion. Right basilar subsegmental atelectasis is noted. Status post right shoulder arthroplasty. IMPRESSION: Large left lung opacity is again noted concerning for pneumonia or atelectasis with associated pleural effusion. Electronically Signed   By: Marijo Conception M.D.   On: 01/17/2021 09:30   DG Abd 1 View  Result Date: 01/17/2021 CLINICAL DATA:  Abdominal pain. EXAM: ABDOMEN - 1 VIEW COMPARISON:  January 15, 2021. FINDINGS: The bowel gas pattern is normal. No radio-opaque calculi or other significant radiographic abnormality are seen. IMPRESSION: Negative. Electronically Signed   By: Marijo Conception M.D.   On: 01/17/2021 09:31   DG Abd 1 View  Result Date: 01/15/2021 CLINICAL DATA:  Abdominal pain. EXAM: ABDOMEN - 1 VIEW COMPARISON:  01/10/2021 FINDINGS: Nasogastric 2 has been withdrawn or removed. Bowel gas pattern is nonobstructive. Residual contrast is identified in nondilated loops of colon. Moderate degenerative changes in the lumbar spine. Remote RIGHT hip arthroplasty. IMPRESSION: Nonobstructive bowel gas pattern. Residual contrast in nondilated colon. Electronically Signed   By: Nolon Nations M.D.   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 Patient now has systemic evidence of infection with a complex fluid collection versus hematoma in the left  subdiaphragmatic space with an associated left-sided pleural effusion. He presents for thoracentesis and percutaneous abscess drain placement. EXAM: ULTRASOUND GUIDED ABSCESS DRAINAGE; US THORACENTESIS ASP PLEURAL SPACE W/IMG GUIDE MEDICATIONS: The patient is currently admitted to the hospital and receiving intravenous antibiotics. The antibiotics were administered within an appropriate time frame prior to the initiation of the procedure. ANESTHESIA/SEDATION: Fentanyl 75 mcg IV; Versed 1.5 mg IV Moderate Sedation Time: 27 minutes The patient was continuously monitored during the procedure by the interventional radiology nurse under my direct supervision. COMPLICATIONS: None immediate. PROCEDURE: Informed written consent was obtained from the patient after a thorough discussion of the procedural risks, benefits and alternatives. All questions were addressed. Maximal Sterile Barrier Technique was utilized including caps, mask, sterile gowns, sterile gloves, sterile drape, hand hygiene and skin antiseptic. A timeout was performed prior to the initiation of the procedure. Attention was first turned to the right-sided pleural effusion. The right thorax was interrogated with ultrasound. A minimally complex pleural effusion is identified. Local anesthesia was attained by infiltration with 1% lidocaine. A small dermatotomy was made. Under real-time ultrasound guidance, a 5 [place] Yueh centesis catheter was  advanced in the pleural space. The catheter was then attached to a evacuated bottle and thoracentesis was performed yielding approximately 600 mL serosanguineous fluid. A sample was reserved for labs. The Yueh centesis catheter was removed and a Band-Aid applied. Next, the left upper quadrant subdiaphragmatic collection was assessed sonographically. A highly complex fluid collection is present in the left upper quadrant. A suitable skin entry site was again selected and marked. Local anesthesia was attained by  infiltration with 1% lidocaine. A small dermatotomy was made. Under real-time ultrasound guidance, a 17 gauge introducer needle was advanced into the fluid collection. Through this, a 0.035 wire was advanced into the fluid collection. The introducer needle was removed. The tract was dilated to 60 [place]. A Cook 12 [place] all-purpose drainage catheter was advanced over the wire and into the fluid collection. Aspiration yields thick turbid bloody fluid. Approximately 40 mL was successfully aspirated. The catheter was then connected to JP bulb suction and secured to the skin with 0 Prolene suture and an adhesive fixation device. The patient tolerated the procedure well. IMPRESSION: 1. Successful left thoracentesis yielding 600 mL serosanguineous pleural fluid. 2. Successful placement of a 12 French drainage catheter into the complex fluid collection in the left subdiaphragmatic space with aspiration of thick, turbid bloody fluid consistent with hematoma. A sample was sent for Gram stain and culture. Electronically Signed   By: Jacqulynn Cadet M.D.   On: 01/17/2021 16:32   CT ABDOMEN PELVIS W CONTRAST  Result Date: 01/16/2021 CLINICAL DATA:  65 year old male status post partial colon resection and splenectomy splenectomy site seroma/hematoma. Abdominal distension. EXAM: CT ABDOMEN AND PELVIS WITH CONTRAST TECHNIQUE: Multidetector CT imaging of the abdomen and pelvis was performed using the standard protocol following bolus administration of intravenous contrast. CONTRAST:  75mL OMNIPAQUE IOHEXOL 350 MG/ML SOLN COMPARISON:  CT Chest, Abdomen and Pelvis 01/12/2021 FINDINGS: Lower chest: Moderate left pleural effusion with simple fluid density but substantial compressive atelectasis of the left lower lobe has not regressed. No pericardial effusion. Right lower lobe consolidation persists, indeterminate for severe atelectasis (the parenchyma is enhancing) versus pneumonia. Middle lobes remain aerated. Hepatobiliary:  Stable, negative. Pancreas: The pancreas remains within normal limits, although there is inflammation at the tail of the pancreas from the splenic region. Spleen: Splenectomy with complex mixed density and the larger component partially rim enhancing fluid collection in the left upper quadrant at the splenectomy bed. The larger component of organized collection there is 62 x 83 by 63 mm for an estimated volume of 162 mL. A smaller rim enhancing component is anterior and lateral to the dominant collection as seen on series 3, image 23. Associated mass effect on the gastric cardia and proximal greater curve, and some of the fluid is inseparable from the gastric serosa (series 3, image 19). No gas within the collections. Adrenals/Urinary Tract: Urinary bladder partially obscured by streak artifact from the hip arthroplasty. Stable adrenal glands and kidneys. Symmetric renal enhancement and early contrast excretion on the delayed images. Stomach/Bowel: Oral contrast throughout the residual large bowel, which appears negative from the hepatic flexure distally. Right lower quadrant percutaneous drain with regional mesenteric inflammation, mild architectural distortion at the cecum. The small discontiguous rim enhancing fluid collections in the lower small bowel mesentery at the pelvic inlet persist and are rim enhancing today on series 3, image 68, but have not increased in size. Collectively these are less than 10 mL. Only mild inflammation of the distal small bowel now. No dilated small bowel. Duodenal diverticulum  redemonstrated. Stable ventral midline abdominal postoperative changes. Mass effect and architectural distortion on the stomach is described with the spleen. No free air identified. Vascular/Lymphatic: Aortoiliac calcified atherosclerosis. Major arterial structures in the abdomen and pelvis remain patent. Portal venous system is patent. No lymphadenopathy identified. Reproductive: Negative. Other: No pelvic  free fluid. Musculoskeletal: Advanced lumbar spine degeneration. Right hip arthroplasty. No acute osseous abnormality identified. IMPRESSION: 1. Splenectomy with organizing complex fluid collections in the left upper quadrant. The largest collection there is 162 mL. Smaller adjacent collections, some inseparable from the gastric serosa and with associated mass effect and architectural distortion on the stomach. Again favor hematoma/seroma. Abscess is not excluded, but there is no gas within the collections. 2. Stable small discontiguous rim enhancing fluid collections in the lower small bowel mesentery at the pelvic inlet a short distance from the percutaneous drain. These have not significantly changed and collectively are less than 10 mL. 3. Moderate left pleural effusion with substantial left lower lobe atelectasis persists. Right lower lobe consolidation persists, indeterminate for atelectasis versus pneumonia. 4. No bowel obstruction or new abnormality identified in the abdomen or pelvis. 5. Aortic Atherosclerosis (ICD10-I70.0). Electronically Signed   By: Genevie Ann M.D.   On: 01/16/2021 04:34   CT CHEST ABDOMEN PELVIS W CONTRAST  Result Date: 01/12/2021 CLINICAL DATA:  History of partial colon resection and splenectomy, chest pain, abdominal pain EXAM: CT CHEST, ABDOMEN, AND PELVIS WITH CONTRAST TECHNIQUE: Multidetector CT imaging of the chest, abdomen and pelvis was performed following the standard protocol during bolus administration of intravenous contrast. CONTRAST:  160mL OMNIPAQUE IOHEXOL 300 MG/ML  SOLN COMPARISON:  01/06/2021, 01/01/2021 FINDINGS: CT CHEST FINDINGS Cardiovascular: The heart is unremarkable without pericardial effusion. No evidence of thoracic aortic aneurysm or dissection. Minimal atherosclerosis. There is a left subclavian central venous catheter tip within the superior vena cava. Mediastinum/Nodes: Borderline enlarged mediastinal lymph nodes measure up to 12 mm in the precarinal  region, likely reactive. Thyroid, trachea, and esophagus are unremarkable. Lungs/Pleura: There is aim moderate left pleural effusion, volume estimated less than 1 L. Dense left lower lobe atelectasis is also noted. Dependent right lower lobe atelectasis is also seen. No pneumothorax. Central airways are patent. Musculoskeletal: There are no acute or destructive bony lesions. Reconstructed images demonstrate no additional findings. CT ABDOMEN PELVIS FINDINGS Hepatobiliary: Small calcified gallstones are again identified without evidence of cholecystitis. The liver is unremarkable. No intrahepatic biliary duct dilation. Pancreas: Unremarkable. No pancreatic ductal dilatation or surrounding inflammatory changes. 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 Residual hematoma within the splenectomy bed measures 4.9 x 7.8 cm. Adrenals/Urinary Tract: The kidneys enhance normally and symmetrically. No urinary tract calculi or obstructive uropathy. The adrenals and bladder are unremarkable. Stomach/Bowel: Postsurgical changes are seen from cecal resection with ileocolic anastomosis. No evidence of bowel obstruction or ileus. No bowel wall thickening or inflammatory change. Vascular/Lymphatic: Aortic atherosclerosis. No enlarged abdominal or pelvic lymph nodes. Reproductive: Prostate is unremarkable. Other: Postsurgical changes are seen from prior midline laparotomy. A surgical drain enters the abdomen in the right lower quadrant, tip within the left hemipelvis. A small fluid collection in the left upper quadrant adjacent to the post splenectomy hematoma measures 5.9 x 4.0 cm image 55/3, with a punctate focus of internal gas, likely representing postoperative seroma. This does not demonstrate any rim enhancement to suggest abscess. There is trace free fluid within the lower abdomen and pelvis. A small localized fluid collection in the right lower quadrant image 100/3 measures 2.4 x 1.9 cm, indeterminate  at this  time. This could reflect organizing phlegmon or developing abscess, and continued follow-up is recommended. Musculoskeletal: Stable right hip arthroplasty. There are no acute or destructive bony lesions. Reconstructed images demonstrate no additional findings. IMPRESSION: 1. 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 2. Indeterminate 2.4 cm fluid collection right lower quadrant adjacent to the cecal resection site. This could reflect organizing phlegmon, and continued follow-up is recommended. 3. Likely postoperative seroma and residual hematoma at the splenectomy surgical site. No evidence of abscess at this location. 4. Moderate left pleural effusion with underlying compressive left lower lobe atelectasis. 5. Cholelithiasis without cholecystitis. 6.  Aortic Atherosclerosis (ICD10-I70.0). Electronically Signed   By: Randa Ngo M.D.   On: 01/12/2021 15:49   DG Chest Port 1 View  Result Date: 01/21/2021 CLINICAL DATA:  Shortness of breath EXAM: PORTABLE CHEST 1 VIEW COMPARISON:  Two days ago FINDINGS: Low volume chest with artifact from EKG leads. Left PICC with tip at the upper right atrium. Left upper quadrant drain. Normal heart size and stable mediastinal contours. Unchanged haziness of the left more than right chest with atelectasis and pleural fluid. IMPRESSION: Stable atelectasis and pleural fluid asymmetric to the left. Electronically Signed   By: Jorje Guild M.D.   On: 01/21/2021 08:04   DG Chest Port 1 View  Result Date: 01/19/2021 CLINICAL DATA:  Shortness of breath. EXAM: PORTABLE CHEST 1 VIEW COMPARISON:  January 17, 2021. FINDINGS: Stable cardiomediastinal silhouette. Left-sided pleural drainage catheter is unchanged in position. Stable small left pleural effusion is noted with associated left basilar atelectasis. Mild right basilar atelectasis is noted as well. Bony thorax is unremarkable. PICC line is unchanged. No pneumothorax is noted. IMPRESSION: Stable left-sided  pleural drainage catheter is noted with associated left pleural effusion and adjacent atelectasis. Stable mild right basilar atelectasis is noted. Electronically Signed   By: Marijo Conception M.D.   On: 01/19/2021 08:01   DG Chest Port 1 View  Result Date: 01/16/2021 CLINICAL DATA:  Shortness of breath.  Asthma. EXAM: PORTABLE CHEST 1 VIEW COMPARISON:  01/14/2020 FINDINGS: Left arm PICC is been placed with the tip in the mid right atrium. Withdraw 4 cm to be above the right atrium if desired. Artifact overlies the chest otherwise. Hazy opacity overlying the left chest probably relates to a layering effusion with associated dependent atelectasis. Mild atelectasis present in the right lower lobe. IMPRESSION: Left arm PICC tip in the right atrium. Withdraw 4 cm be above the right atrium if desired. Layering pleural effusion on the left with atelectatic changes of the left lung. Lesser patchy atelectasis at the right lung base. Electronically Signed   By: Nelson Chimes M.D.   On: 01/16/2021 08:38   DG CHEST PORT 1 VIEW  Result Date: 01/13/2021 CLINICAL DATA:  Respiratory distress EXAM: PORTABLE CHEST 1 VIEW COMPARISON:  01/10/2021 FINDINGS: Low lung volumes. Moderate left and small right pleural effusions. No frank interstitial edema. No pneumothorax. The heart is normal in size. Left subclavian venous catheter terminates in the mid SVC. Right shoulder arthroplasty. IMPRESSION: Moderate left and small right pleural effusions. No frank interstitial edema. Electronically Signed   By: Julian Hy M.D.   On: 01/13/2021 02:34   DG CHEST PORT 1 VIEW  Result Date: 01/10/2021 CLINICAL DATA:  Hypoxemia EXAM: PORTABLE CHEST 1 VIEW COMPARISON:  October sixteenth, 2022. FINDINGS: Evaluation is limited secondary to rotation. The cardiomediastinal silhouette is unchanged in contour.Interval extubation. The enteric tube courses through the chest to the abdomen beyond  the field-of-view. Revisualization of the LEFT  subclavian approach central catheter with tip terminating over the region of the confluence of the LEFT brachiocephalic vein and SVC. Low lung volumes. Decreased conspicuity of bilateral small pleural effusions. No pneumothorax. LEFT retrocardiac homogeneous opacity most consistent with atelectasis. Visualized abdomen is unremarkable. Status post RIGHT shoulder arthroplasty. IMPRESSION: Interval extubation. LEFT basilar homogeneous opacity, likely atelectasis. Electronically Signed   By: Valentino Saxon M.D.   On: 01/10/2021 08:20   DG Chest Port 1V same Day  Result Date: 02/07/2021 CLINICAL DATA:  Shortness of breath EXAM: PORTABLE CHEST 1 VIEW COMPARISON:  01/21/2021 FINDINGS: Transverse diameter of heart is slightly increased. Thoracic aorta is tortuous and ectatic. Tip of PICC line is seen in the right atrium. There are no signs of alveolar pulmonary edema. There is improvement in aeration of left parahilar region and both lower lung fields. Residual patchy infiltrates are seen in the left lower lung fields. Linear density in the right parahilar region may suggest fluid in the interlobar fissure or subsegmental atelectasis. Left chest tube is noted. There is no pneumothorax. IMPRESSION: There is interval improvement in aeration of mid and lower lung fields suggesting resolving atelectasis/pneumonia. Small left pleural effusion. Electronically Signed   By: Elmer Picker M.D.   On: 02/07/2021 11:51   DG Abd Portable 1V  Result Date: 01/10/2021 CLINICAL DATA:  NG tube placement EXAM: PORTABLE ABDOMEN - 1 VIEW COMPARISON:  KUB 01/06/2021 FINDINGS: The enteric catheter tip and side hole project over the stomach. There is a nonobstructive bowel gas pattern. Enteric contrast is seen in the right colon, partially imaged. The lungs are better assessed on the same day chest radiograph. IMPRESSION: NG tube tip and side hole in the stomach. Electronically Signed   By: Valetta Mole M.D.   On: 01/10/2021  16:39   VAS [place] LOWER EXTREMITY VENOUS (DVT)  Result Date: 02/07/2021  Lower Venous DVT Study Patient Name:  Travis [name]  Date of Exam:   02/07/2021 Medical Rec #: [id number]      Accession #:    [id number] Date of Birth: 06/15/1955     Patient Gender: M Patient Age:   43 years Exam Location:  [facility] Procedure:      VAS [place] LOWER EXTREMITY VENOUS (DVT) Referring Phys: Oren Binet --------------------------------------------------------------------------------  Indications: Bilateral knee pain.  Comparison Study: No prior studies. Performing Technologist: Darlin Coco RDMS, RVT  Examination Guidelines: A complete evaluation includes B-mode imaging, spectral Doppler, color Doppler, and power Doppler as needed of all accessible portions of each vessel. Bilateral testing is considered an integral part of a complete examination. Limited examinations for reoccurring indications may be performed as noted. The reflux portion of the exam is performed with the patient in reverse Trendelenburg.  +---------+---------------+---------+-----------+----------+--------------+ RIGHT    CompressibilityPhasicitySpontaneityPropertiesThrombus Aging +---------+---------------+---------+-----------+----------+--------------+ CFV      Full           Yes      Yes                                 +---------+---------------+---------+-----------+----------+--------------+ SFJ      Full                                                        +---------+---------------+---------+-----------+----------+--------------+  FV Prox  Full                                                        +---------+---------------+---------+-----------+----------+--------------+ FV Mid   Full                                                        +---------+---------------+---------+-----------+----------+--------------+ FV DistalFull                                                         +---------+---------------+---------+-----------+----------+--------------+ PFV      Full                                                        +---------+---------------+---------+-----------+----------+--------------+ POP      Full           Yes      Yes                                 +---------+---------------+---------+-----------+----------+--------------+ PTV      Full                                                        +---------+---------------+---------+-----------+----------+--------------+ PERO     Full                                                        +---------+---------------+---------+-----------+----------+--------------+ Gastroc  Full                                                        +---------+---------------+---------+-----------+----------+--------------+   +---------+---------------+---------+-----------+----------+--------------+ LEFT     CompressibilityPhasicitySpontaneityPropertiesThrombus Aging +---------+---------------+---------+-----------+----------+--------------+ CFV      Full           Yes      Yes                                 +---------+---------------+---------+-----------+----------+--------------+ SFJ      Full                                                        +---------+---------------+---------+-----------+----------+--------------+  FV Prox  Full                                                        +---------+---------------+---------+-----------+----------+--------------+ FV Mid   Full                                                        +---------+---------------+---------+-----------+----------+--------------+ FV DistalFull                                                        +---------+---------------+---------+-----------+----------+--------------+ PFV      Full                                                         +---------+---------------+---------+-----------+----------+--------------+ POP      Full           Yes      Yes                                 +---------+---------------+---------+-----------+----------+--------------+ PTV      Full                                                        +---------+---------------+---------+-----------+----------+--------------+ PERO     Full                                                        +---------+---------------+---------+-----------+----------+--------------+ Gastroc  Full                                                        +---------+---------------+---------+-----------+----------+--------------+     Summary: RIGHT: - There is no evidence of deep vein thrombosis in the lower extremity.  - A cystic structure is found in the popliteal fossa.  LEFT: - There is no evidence of deep vein thrombosis in the lower extremity.  - No cystic structure found in the popliteal fossa.  *See table(s) above for measurements and observations. Electronically signed by Deitra Mayo MD on 02/07/2021 at 3:40:19 PM.    Final    [place] EKG SITE RITE  Result Date: 01/13/2021 If Site Rite image not attached, placement could not be confirmed due to current cardiac rhythm.  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 Patient now has systemic evidence of infection with a complex fluid collection versus hematoma in the left subdiaphragmatic space with an associated left-sided pleural effusion. He presents for thoracentesis and percutaneous abscess drain placement. EXAM: ULTRASOUND GUIDED ABSCESS DRAINAGE; US THORACENTESIS ASP PLEURAL SPACE W/IMG GUIDE MEDICATIONS: The patient is currently admitted to the hospital and receiving intravenous antibiotics. The antibiotics  were administered within an appropriate time frame prior to the initiation of the procedure. ANESTHESIA/SEDATION: Fentanyl 75 mcg IV; Versed 1.5 mg IV Moderate Sedation Time: 27 minutes The patient was continuously monitored during the procedure by the interventional radiology nurse under my direct supervision. COMPLICATIONS: None immediate. PROCEDURE: Informed written consent was obtained from the patient after a thorough discussion of the procedural risks, benefits and alternatives. All questions were addressed. Maximal Sterile Barrier Technique was utilized including caps, mask, sterile gowns, sterile gloves, sterile drape, hand hygiene and skin antiseptic. A timeout was performed prior to the initiation of the procedure. Attention was first turned to the right-sided pleural effusion. The right thorax was interrogated with ultrasound. A minimally complex pleural effusion is identified. Local anesthesia was attained by infiltration with 1% lidocaine. A small dermatotomy was made. Under real-time ultrasound guidance, a 5 [place] Yueh centesis catheter was advanced in the pleural space. The catheter was then attached to a evacuated bottle and thoracentesis was performed yielding approximately 600 mL serosanguineous fluid. A sample was reserved for labs. The Yueh centesis catheter was removed and a Band-Aid applied. Next, the left upper quadrant subdiaphragmatic collection was assessed sonographically. A highly complex fluid collection is present in the left upper quadrant. A suitable skin entry site was again selected and marked. Local anesthesia was attained by infiltration with 1% lidocaine. A small dermatotomy was made. Under real-time ultrasound guidance, a 17 gauge introducer needle was advanced into the fluid collection. Through this, a 0.035 wire was advanced into the fluid collection. The introducer needle was removed. The tract was dilated to 93 [place]. A Cook 12 [place] all-purpose drainage catheter was  advanced over the wire and into the fluid collection. Aspiration yields thick turbid bloody fluid. Approximately 40 mL was successfully aspirated. The catheter was then connected to JP bulb suction and secured to the skin with 0 Prolene suture and an adhesive fixation device. The patient tolerated the procedure well. IMPRESSION: 1. Successful left thoracentesis yielding 600 mL serosanguineous pleural fluid. 2. Successful placement of a 12 French drainage catheter into the complex fluid collection in the left subdiaphragmatic space with aspiration of thick, turbid bloody fluid consistent with hematoma. A sample was sent for Gram stain and culture. Electronically Signed   By: Jacqulynn Cadet M.D.   On: 01/17/2021 16:32    I spent more than 80  minutes for this patient encounter including review of prior medical records/discussing diagnostics and treatment plan with the patient/family/coordinate care with primary/other specialits with greater than 50% of time in face to face encounter.   Electronically signed by:   Rosiland Oz, MD Infectious Disease Physician [facility] Pager: [phone]

## 2021-02-09 DIAGNOSIS — Y838 Other surgical procedures as the cause of abnormal reaction of the patient, or of later complication, without mention of misadventure at the time of the procedure: Secondary | ICD-10-CM | POA: Diagnosis not present

## 2021-02-09 DIAGNOSIS — J449 Chronic obstructive pulmonary disease, unspecified: Secondary | ICD-10-CM | POA: Diagnosis not present

## 2021-02-09 DIAGNOSIS — Z23 Encounter for immunization: Secondary | ICD-10-CM | POA: Diagnosis not present

## 2021-02-09 DIAGNOSIS — Z794 Long term (current) use of insulin: Secondary | ICD-10-CM | POA: Diagnosis not present

## 2021-02-09 DIAGNOSIS — R188 Other ascites: Secondary | ICD-10-CM | POA: Diagnosis not present

## 2021-02-09 DIAGNOSIS — E46 Unspecified protein-calorie malnutrition: Secondary | ICD-10-CM | POA: Diagnosis not present

## 2021-02-09 DIAGNOSIS — I82409 Acute embolism and thrombosis of unspecified deep veins of unspecified lower extremity: Secondary | ICD-10-CM | POA: Diagnosis not present

## 2021-02-09 DIAGNOSIS — I428 Other cardiomyopathies: Secondary | ICD-10-CM | POA: Diagnosis not present

## 2021-02-09 DIAGNOSIS — E119 Type 2 diabetes mellitus without complications: Secondary | ICD-10-CM | POA: Diagnosis not present

## 2021-02-09 DIAGNOSIS — G9341 Metabolic encephalopathy: Secondary | ICD-10-CM | POA: Diagnosis not present

## 2021-02-09 DIAGNOSIS — Z743 Need for continuous supervision: Secondary | ICD-10-CM | POA: Diagnosis not present

## 2021-02-09 DIAGNOSIS — I5022 Chronic systolic (congestive) heart failure: Secondary | ICD-10-CM | POA: Diagnosis not present

## 2021-02-09 DIAGNOSIS — R532 Functional quadriplegia: Secondary | ICD-10-CM | POA: Diagnosis not present

## 2021-02-09 DIAGNOSIS — S3609XA Other injury of spleen, initial encounter: Secondary | ICD-10-CM | POA: Diagnosis not present

## 2021-02-09 DIAGNOSIS — R279 Unspecified lack of coordination: Secondary | ICD-10-CM | POA: Diagnosis not present

## 2021-02-09 DIAGNOSIS — N1831 Chronic kidney disease, stage 3a: Secondary | ICD-10-CM | POA: Diagnosis not present

## 2021-02-09 DIAGNOSIS — J90 Pleural effusion, not elsewhere classified: Secondary | ICD-10-CM | POA: Diagnosis not present

## 2021-02-09 DIAGNOSIS — E785 Hyperlipidemia, unspecified: Secondary | ICD-10-CM | POA: Diagnosis not present

## 2021-02-09 DIAGNOSIS — J9601 Acute respiratory failure with hypoxia: Secondary | ICD-10-CM | POA: Diagnosis not present

## 2021-02-09 DIAGNOSIS — K3532 Acute appendicitis with perforation and localized peritonitis, without abscess: Secondary | ICD-10-CM | POA: Diagnosis not present

## 2021-02-09 DIAGNOSIS — M159 Polyosteoarthritis, unspecified: Secondary | ICD-10-CM | POA: Diagnosis not present

## 2021-02-09 DIAGNOSIS — D735 Infarction of spleen: Secondary | ICD-10-CM | POA: Diagnosis not present

## 2021-02-09 DIAGNOSIS — I2510 Atherosclerotic heart disease of native coronary artery without angina pectoris: Secondary | ICD-10-CM | POA: Diagnosis not present

## 2021-02-09 DIAGNOSIS — J9600 Acute respiratory failure, unspecified whether with hypoxia or hypercapnia: Secondary | ICD-10-CM | POA: Diagnosis not present

## 2021-02-09 DIAGNOSIS — D649 Anemia, unspecified: Secondary | ICD-10-CM | POA: Diagnosis not present

## 2021-02-09 DIAGNOSIS — I10 Essential (primary) hypertension: Secondary | ICD-10-CM | POA: Diagnosis not present

## 2021-02-09 DIAGNOSIS — K3521 Acute appendicitis with generalized peritonitis, with abscess: Secondary | ICD-10-CM | POA: Diagnosis not present

## 2021-02-09 LAB — GLUCOSE, CAPILLARY
Glucose-Capillary: 92 mg/dL (ref 70–99)
Glucose-Capillary: 100 mg/dL — ABNORMAL HIGH (ref 70–99)

## 2021-02-09 MED ORDER — APIXABAN 5 MG PO TABS: 10.0000 mg | ORAL_TABLET | Freq: Two times a day (BID) | ORAL | Status: DC

## 2021-02-09 MED ORDER — ENSURE ENLIVE PO LIQD: 237.0000 mL | ORAL | 12 refills | Status: DC

## 2021-02-09 MED ORDER — PROSOURCE PLUS PO LIQD: 30.0000 mL | Freq: Every day | ORAL | Status: DC

## 2021-02-09 MED ORDER — APIXABAN 5 MG PO TABS: 5.0000 mg | ORAL_TABLET | Freq: Two times a day (BID) | ORAL | Status: DC

## 2021-02-09 NOTE — Progress Notes (Signed)
Physical Therapy Treatment Patient Details Name: Travis [name] MRN: [id number] DOB: 05-30-1955 Today's Date: 02/09/2021   History of Present Illness Pt is a 65 y/o male admitted 10/9 with complaints of R lower quadrant abdominal pain. Found with intra-abdominal sepsis with perforated appendix and abdominal abscess; s/p diagnostic laparoscopy, ileocecectomy, drainage of interaabdominal abscess 10/9. Post op ileus. 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 Pt self extubated 10/16. L thoracentesis 10/25 with 600cc drained. PMHx: CHF, CKD, COPD, TIA, diabetes, HTN, gout, CAD.    PT Comments    Patient continues to make progress with therapy, although continues to need cues for safe use of RW. Patient eager to incr his strength and independence for return home.  Patient scheduled to discharge to SNF today.    Recommendations for follow up therapy are one component of a multi-disciplinary discharge planning process, led by the attending physician.  Recommendations may be updated based on patient status, additional functional criteria and insurance authorization.  Follow Up Recommendations  Skilled nursing-short term rehab (<3 hours/day)     Assistance Recommended at Discharge Frequent or constant Supervision/Assistance  Equipment Recommendations  Rolling walker (2 wheels)    Recommendations for Other Services OT consult     Precautions / Restrictions Precautions Precautions: Fall Precaution Comments: drain removed 11/16 Restrictions Weight Bearing Restrictions: No Other Position/Activity Restrictions: Occasional L knee buckle     Mobility  Bed Mobility Overal bed mobility: Modified Independent;Needs Assistance Bed Mobility: Supine to Sit     Supine to sit: Supervision          Transfers Overall transfer level: Needs assistance Equipment used: Rolling walker (2 wheels) Transfers: Sit to/from Stand Sit to Stand: Supervision           General transfer  comment: cues for safety with hand placement    Ambulation/Gait Ambulation/Gait assistance: Min guard Gait Distance (Feet): 200 Feet Assistive device: Rolling walker (2 wheels) Gait Pattern/deviations: Step-through pattern;Decreased stride length;Trunk flexed Gait velocity: decreased     General Gait Details: cues for upright posture, proximity to RW; cues for turning to not "swing" RW a full 180 degrees (for balance and decr back pain)   Stairs             Wheelchair Mobility    Modified Rankin (Stroke Patients Only)       Balance Overall balance assessment: Needs assistance Sitting-balance support: Feet supported Sitting balance-Leahy Scale: Good     Standing balance support: Bilateral upper extremity supported;Reliant on assistive device for balance Standing balance-Leahy Scale: Poor Standing balance comment: reliant on external support                            Cognition Arousal/Alertness: Awake/alert Behavior During Therapy: WFL for tasks assessed/performed Overall Cognitive Status: Within Functional Limits for tasks assessed (not specifically challenged)                                          Exercises      General Comments General comments (skin integrity, edema, etc.): Pharmacist present on return to room for education prior to discharge.      Pertinent Vitals/Pain Pain Assessment: Faces Faces Pain Scale: Hurts even more Pain Location: back Pain Descriptors / Indicators: Discomfort Pain Intervention(s): Limited activity within patient's tolerance;Monitored during session    Home Living  Prior Function            PT Goals (current goals can now be found in the care plan section) Acute Rehab PT Goals Patient Stated Goal: Hoping to return to his apartment. PT Goal Formulation: With patient Time For Goal Achievement: 02/21/21 Potential to Achieve Goals: Good Progress towards  PT goals: Progressing toward goals    Frequency    Min 2X/week      PT Plan Current plan remains appropriate    Co-evaluation              AM-PAC PT "6 Clicks" Mobility   Outcome Measure  Help needed turning from your back to your side while in a flat bed without using bedrails?: A Little Help needed moving from lying on your back to sitting on the side of a flat bed without using bedrails?: A Little Help needed moving to and from a bed to a chair (including a wheelchair)?: A Little Help needed standing up from a chair using your arms (e.g., wheelchair or bedside chair)?: A Little Help needed to walk in hospital room?: A Little Help needed climbing 3-5 steps with a railing? : A Lot 6 Click Score: 17    End of Session Equipment Utilized During Treatment: Gait belt Activity Tolerance: Patient tolerated treatment well Patient left: in chair;with call bell/phone within reach;with chair alarm set Nurse Communication: Mobility status PT Visit Diagnosis: Other abnormalities of gait and mobility (R26.89);Muscle weakness (generalized) (M62.81) Pain - part of body:  (back)     Time: 3976-7341 PT Time Calculation (min) (ACUTE ONLY): 16 min  Charges:  $Gait Training: 8-22 mins                      Arby Barrette, PT [place]  Pager [phone] Office [phone]    Rexanne Mano 02/09/2021, 10:52 AM

## 2021-02-09 NOTE — Plan of Care (Signed)
  Problem: Health Behavior/Discharge Planning: Goal: Ability to manage health-related needs will improve Outcome: Progressing   Problem: Clinical Measurements: Goal: Ability to maintain clinical measurements within normal limits will improve Outcome: Progressing Goal: Will remain free from infection Outcome: Progressing Goal: Diagnostic test results will improve Outcome: Progressing Goal: Respiratory complications will improve Outcome: Progressing Goal: Cardiovascular complication will be avoided Outcome: Progressing   Problem: Activity: Goal: Risk for activity intolerance will decrease Outcome: Progressing   Problem: Nutrition: Goal: Adequate nutrition will be maintained Outcome: Progressing   Problem: Coping: Goal: Level of anxiety will decrease Outcome: Progressing   Problem: Elimination: Goal: Will not experience complications related to bowel motility Outcome: Progressing Goal: Will not experience complications related to urinary retention Outcome: Progressing   Problem: Pain Managment: Goal: General experience of comfort will improve Outcome: Progressing   Problem: Safety: Goal: Ability to remain free from injury will improve Outcome: Progressing   Problem: Skin Integrity: Goal: Risk for impaired skin integrity will decrease Outcome: Progressing   Problem: Education: Goal: Ability to demonstrate management of disease process will improve Outcome: Progressing Goal: Ability to verbalize understanding of medication therapies will improve Outcome: Progressing Goal: Individualized Educational Video(s) Outcome: Progressing   Problem: Activity: Goal: Capacity to carry out activities will improve Outcome: Progressing   Problem: Cardiac: Goal: Ability to achieve and maintain adequate cardiopulmonary perfusion will improve Outcome: Progressing   Problem: Education: Goal: Knowledge of disease or condition will improve Outcome: Progressing Goal: Knowledge of  the prescribed therapeutic regimen will improve Outcome: Progressing Goal: Individualized Educational Video(s) Outcome: Progressing   Problem: Activity: Goal: Ability to tolerate increased activity will improve Outcome: Progressing Goal: Will verbalize the importance of balancing activity with adequate rest periods Outcome: Progressing   Problem: Respiratory: Goal: Ability to maintain a clear airway will improve Outcome: Progressing Goal: Levels of oxygenation will improve Outcome: Progressing Goal: Ability to maintain adequate ventilation will improve Outcome: Progressing

## 2021-02-09 NOTE — TOC Transition Note (Signed)
Transition of Care [facility]) - CM/SW Discharge Note   Patient Details  Name: Travis [name] MRN: [id number] Date of Birth: 30-May-1955  Transition of Care [facility]) CM/SW Contact:  Milas Gain, [place] Phone Number: 02/09/2021, 10:18 AM   Clinical Narrative:     Patient will DC to: [facility]   Anticipated DC date: 02/09/2021  Family notified: Education officer, community by: Corey Harold  ?  Per MD patient ready for DC to [facility] . RN, patient, patient's family, and facility notified of DC. Discharge Summary sent to facility. RN given number for report tele# [phone] RM# [id number]. DC packet on chart. DNR signed by MD attached to patients DC packet.Ambulance transport requested for patient.  CSW signing off.   Final next level of care: Skilled Nursing Facility Barriers to Discharge: No Barriers Identified   Patient Goals and CMS Choice Patient states their goals for this hospitalization and ongoing recovery are:: SNF CMS Medicare.gov Compare Post Acute Care list provided to:: Patient Represenative (must comment) (niece christal) Choice offered to / list presented to :  (niece cristal)  Discharge Placement              Patient chooses bed at:  [facility]) Patient to be transferred to facility by: Art[place] Name of family member notified: [name] Patient and family notified of of transfer: 02/09/21  Discharge Plan and Services In-house Referral: Clinical Social Work   Post Acute Care Choice: Long Term Acute Care (LTAC)                               Social Determinants of Health (SDOH) Interventions     Readmission Risk Interventions No flowsheet data found.

## 2021-02-09 NOTE — Progress Notes (Signed)
Patient IV removed with no complaints of pain or discomfort. Report called to D.O.N of facility. Patient assisted with gathering personal belongings by staff. No complaints or concerns stated at this time.  Hard prescription in discharge packet. Verified by this RN and reported to facility.

## 2021-02-09 NOTE — Discharge Summary (Signed)
PATIENT DETAILS Name: Travis [name] Age: 65 y.o. Sex: male Date of Birth: January 01, 1956 MRN: [id number]. Admitting Physician: Georganna Skeans, MD [id number], Alyson Locket, NP  Admit Date: 01/01/2021 Discharge date: 02/09/2021  Recommendations for Outpatient Follow-up:  Follow up with PCP in 1-2 weeks Please obtain CMP/CBC in one week Please ensure follow-up with interventional radiology and general surgery.  Admitted From:  Home  Disposition: SNF   Home Health: No  Equipment/Devices: None  Discharge Condition: Stable  CODE STATUS: DNR  Diet recommendation:  Diet Order             Diet - low sodium heart healthy           Diet general           Diet regular Room service appropriate? Yes; Fluid consistency: Thin  Diet effective now                    Brief Narrative:  Patient is a 65 y.o. male with history of HFrEF, CKD stage III, COPD, HTN, DM-2, HLD, nonobstructive CAD-who presented with abdominal pain-found to have sepsis from ruptured appendicitis-underwent exploratory laparotomy/ileocecectomy and drainage of intra-abdominal abscess.Patient was briefly managed in the ICU-unfortunately-on 10/14 he developed severe abdominal pain -he was then found to have hemorrhagic shock due to a spontaneous splenic rupture-he was reevaluated by general surgery and underwent a splenectomy.  He was readmitted to the ICU-remained on the ventilator-he self extubated on 10/16-he was monitored closely-upon further stability-transfer to the [facility] hospital[facility] service on 10/20.   Significant events: 10/09>>admit- ex lap, remained intubated post op-to ICU 10/10>>extubated 10/12>> transferred to [facility] 10/14>>hypotensive/abd pain-CT showed spleenc rupture-to OR for splenectomy. 10/16>> Self-extubated  10/18>> Increased O2 requirement to 8L 10/19>> O2 weaned to 4L 10/20>> [facility]    Radiology/Echo 10/09>> CT abdomen: Acute appendicitis with perforation 10/14>> CT abdomen:  Splenic rupture with large splenic hematoma 10/15>> TTE: EF 45-50% 10/20>> CT abdomen/pelvis: Indeterminate 2.4 cm fluid collection in the right lower quadrant adjacent to the cecal resection site.  Moderate left pleural effusion with compressive atelectasis. 10/24 >> CT abdomen/pelvis: complex fluid collections in the left upper quadrant. The largest collection there is 162 mL.  10/27 >> CT abdomen/pelvis: Placement of pigtail catheter in the left upper quadrant fluid collection. 10/31>> CT abdomen/pelvis: Interval decrease size of the dominant fluid collection in the left upper quadrant. 11/15>> CT abdomen/pelvis: Previously noted fluid collection has resolved-no loculated intra-abdominal fluid collection identified. 11/15>> CXR: Interval improvement in aeration of mid/lower lung-suggesting resolving atelectasis/pneumonia. 11/15>> bilateral lower extremity Doppler: No DVT. 11/16>> bilateral upper extremity Doppler: Acute DVT in the left brachial vein.   Procedures: 10/9>>exploratory laparotomy, ileocecectomy, drainage of intraabdominal abscess  10/14>> exploratory laparotomy and splenectomy 10/14>> left subclavian triple-lumen catheter 01/17/2021  >> left ultrasound-guided thoracentesis 600 cc exudative fluid, gram stain -ve 01/17/2021  >> LUQ JP drain by IR 02/08/2021>> LUQ JP drain removed by IR.  Brief Hospital Course: Sepsis due to acute appendicitis with perforation/intra-abdominal abscesses and Bacteroides bacteremia (s/p laparotomy/ileocecectomy and drainage of intra-abdominal abscess on 10/9): Sepsis physiology has resolved-postoperatively he developed a left upper quadrant fluid collection-requiring drain placement by IR.  Fluid cultures grew E. coli.  Rocephin/Flagyl was discontinued on 11/14.  Repeat CT abdomen showed resolution of the fluid collection in the left upper quadrant.  Patient developed a fever on 11/15-subsequently underwent work-up-which showed a possible small DVT in  the left brachial vein-this was likely felt to be the cause of fever.  He was started on anticoagulation-he no longer has a fever.  He was briefly evaluated by ID as well.  General surgery and IR followed closely throughout this hospital stay.  IR removed his left upper quadrant drain on 11/16.  Since he is now afebrile-plan is to discharge him to SNF.  Patient will need close outpatient follow-up with general surgery.     Left upper extremity DVT: Likely provoked due to acute illness-and also had a PICC line in the same arm that was removed a few days ago.  Probably require 3 months of therapy with Eliquis.  PCP/attending MD at SNF to assess further.   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   Acute hypoxic respiratory failure due to atelectasis/pleural effusion: Self extubated on 10/16-stable on anywhere from 2-4 L of oxygen-did develop some transient shortness of breath on 10/20-which responded to bronchodilators/diuretics.    Patient appears to have a moderate-sized left-sided pleural effusion which is likely transudative from-HFrEF/reactive to splenectomy.  Patient underwent ultrasound-guided  thoracentesis on 01/17/2021 which was consistent with sympathetic exudative effusion gram stain negative cultures negative   Acute metabolic encephalopathy: Rapidly improving-continue supportive care.   AKI on CKD stage IIIa: AKI hemodynamically mediated-hold Lasix, post hydration and packed RBC transfusion, improved.   HTN: BP stable-continue Coreg-losartan remains on hold   Nonischemic cardiomyopathy/HFrEF: Not in exacerbation-continue Lasix and monitor volume status closely.  Follow electrolytes.    Nonobstructive CAD: No anginal symptoms-already on beta-blocker-we will resume aspirin/statin over the next few days when more stable.   Hypomagnesemia: repleted   Multifactorial anemia:  Due to combination of splenic rupture, acute illness-has been transfused numerous units of PRBC-hemoglobin now stable.  Follow periodically.     HLD: Resume statin/fenofibrate when oral intake was stable.   COPD: Stable-continue bronchodilators   Functional quadriplegia/debility/deconditioning: Due to acute illness-continue PT/OT eval-either SNF or LTAC on discharge.   DM-2 (A1c 6.4 on 06/02/2020): CBGs stable-continue SSI  Failure to thrive syndrome: slowly improving-continue to encourage oral intake-continue nutritional supplements.    Nutrition Status: Nutrition Problem: Moderate Malnutrition Etiology: acute illness, chronic illness (perforated appendix, CAD, cardiomyopathy) Signs/Symptoms: mild muscle depletion, mild fat depletion Interventions: Ensure Enlive (each supplement provides 350kcal and 20 grams of protein), MVI   BMI: Estimated body mass index is 24.28 kg/m as calculated from the following:   Height as of this encounter: 6' (1.829 m).   Weight as of this encounter: 81.2 kg.  Discharge Diagnoses:  Principal Problem:   Ruptured appendicitis Active Problems:   Diabetes ([place])   HLD (hyperlipidemia)   Gout   Chronic systolic heart failure (HCC)   CRA (central retinal artery occlusion)   Essential hypertension   COPD (chronic obstructive pulmonary disease) (HCC)   CKD (chronic kidney disease), stage III (HCC)   Sepsis ([place])   Status post surgery   Malnutrition of moderate degree   Discharge Instructions:  Activity:  As tolerated with Full fall precautions use walker/cane & assistance as needed  Discharge Instructions     Call MD for:  redness, tenderness, or signs of infection (pain, swelling, redness, odor or green/yellow discharge around incision site)   Complete by: As directed    Call MD for:  severe uncontrolled pain   Complete by: As directed    Diet - low sodium heart healthy   Complete by: As directed    Diet general   Complete by: As directed  Discharge wound care:   Complete by: As directed    1. Apply a moistened saline gauze into the abdominal wound and secure with ABD pad daily.   Discharge wound care:   Complete by: As directed    Apply a moistened saline gauze into the abdominal wound and secure with ABD pad daily.   Increase activity slowly   Complete by: As directed    Increase activity slowly   Complete by: As directed       Allergies as of 02/09/2021   No Known Allergies      Medication List     STOP taking these medications    aspirin 325 MG tablet   carvedilol 6.25 MG tablet Commonly known as: COREG   cetirizine 10 MG tablet Commonly known as: ZYRTEC   fenofibrate 145 MG tablet Commonly known as: TRICOR   furosemide 40 MG tablet Commonly known as: LASIX   losartan 25 MG tablet Commonly known as: COZAAR       TAKE these medications    (feeding supplement) PROSource Plus liquid Take 30 mLs by mouth 2 (two) times daily between meals.   feeding supplement Liqd Take 237 mLs by mouth daily.   (feeding supplement) PROSource Plus liquid Take 30 mLs by mouth daily. Start taking on: February 10, 2021   acetaminophen 500 MG tablet Commonly known as: TYLENOL Take 2 tablets (1,000 mg total) by mouth every 8 (eight) hours as needed.   albuterol 108 (90 Base) MCG/ACT inhaler Commonly known as: VENTOLIN HFA Inhale 2 puffs into the lungs every 6 (six) hours as needed for wheezing or shortness of breath.   allopurinol 100 MG tablet Commonly known as: ZYLOPRIM Take 1 tablet (100 mg total) by mouth 2 (two) times daily.   apixaban 5 MG Tabs tablet Commonly known as: ELIQUIS Take 2 tablets (10 mg total) by mouth 2 (two) times daily. On 11/23-switch to 1 tablet (5 mg) by mouth twice daily.   apixaban 5 MG Tabs tablet Commonly known as: ELIQUIS Take 1 tablet (5 mg total) by mouth 2 (two) times daily. Start taking on: February 15, 2021   atorvastatin 20 MG tablet Commonly known as:  LIPITOR Take 20 mg by mouth daily.   bisacodyl 10 MG suppository Commonly known as: DULCOLAX Place 1 suppository (10 mg total) rectally daily as needed for moderate constipation.   ferrous sulfate 325 (65 FE) MG tablet Take 1 tablet (325 mg total) by mouth daily with breakfast.   FISH OIL PO Take 1 capsule by mouth daily.   fluticasone 50 MCG/ACT nasal spray Commonly known as: FLONASE Place 1-2 sprays into both nostrils daily as needed for allergies or rhinitis.   hydroxypropyl methylcellulose / hypromellose 2.5 % ophthalmic solution Commonly known as: ISOPTO TEARS / GONIOVISC Place 1 drop into both eyes 3 (three) times daily as needed for dry eyes.   insulin aspart 100 UNIT/ML injection Commonly known as: novoLOG 0-9 Units, Subcutaneous, 3 times daily with meals CBG < 70: Implement Hypoglycemia measures CBG 70 - 120: 0 units CBG 121 - 150: 1 unit CBG 151 - 200: 2 units CBG 201 - 250: 3 units CBG 251 - 300: 5 units CBG 301 - 350: 7 units CBG 351 - 400: 9 units CBG > 400: call MD   ipratropium-albuterol 0.5-2.5 (3) MG/3ML Soln Commonly known as: DUONEB Take 3 mLs by nebulization every 6 (six) hours as needed.   magnesium oxide 400 MG tablet Commonly known as: MAG-OX Take 1 tablet (  400 mg total) by mouth daily.   methocarbamol 500 MG tablet Commonly known as: ROBAXIN Take 1 tablet (500 mg total) by mouth every 8 (eight) hours as needed for muscle spasms.   metoprolol tartrate 50 MG tablet Commonly known as: LOPRESSOR Take 1 tablet (50 mg total) by mouth 2 (two) times daily.   mirtazapine 30 MG tablet Commonly known as: REMERON Take 1 tablet (30 mg total) by mouth at bedtime.   multivitamin Tabs tablet Take 1 tablet by mouth daily with breakfast.   oxyCODONE 20 mg 12 hr tablet Commonly known as: OXYCONTIN Take 1 tablet (20 mg total) by mouth every 12 (twelve) hours.   oxyCODONE 5 MG immediate release tablet Commonly known as: Oxy IR/ROXICODONE Take 1 tablet (5 mg  total) by mouth every 6 (six) hours as needed for moderate pain or severe pain.   pantoprazole 40 MG tablet Commonly known as: Protonix Take 1 tablet (40 mg total) by mouth daily.   polyethylene glycol 17 g packet Commonly known as: MIRALAX / GLYCOLAX Take 17 g by mouth daily.   triamcinolone cream 0.1 % Commonly known as: KENALOG APPLY TO AFFECTED AREAS TWICE A DAY AS NEEDED. AVOID FACE, GROIN, OR UNDERARMS. What changed: See the new instructions.               Discharge Care Instructions  (From admission, onward)           Start     Ordered   02/09/21 0000  Discharge wound care:       Comments: 1. Apply a moistened saline gauze into the abdominal wound and secure with ABD pad daily.   02/09/21 0936   02/09/21 0000  Discharge wound care:       Comments: Apply a moistened saline gauze into the abdominal wound and secure with ABD pad daily.   02/09/21 0936            Contact information for follow-up providers     Jesusita Oka, MD. Go on 03/13/2021.   Specialty: Surgery Why: Follow up appointment scheduled for 1:50 PM. Please arrive 30 min prior to appointment time for check in. Bring photo ID and insurance information. Contact information: [address] [place] [address] [phone]         Cable, James M, NP. Schedule an appointment as soon as possible for a visit on 03/20/2021.   Specialty: Pain Medicine Why: You will need another pneumococcal vaccine as part of your post-splenectomy vaccine series. This should be given 8 weeks after your other post-splenectomy vaccines. Contact information: [place] [address] [phone]         [facility] Follow up in 2 week(s).   Why: Clinic will contact you in the next few days regarding appointment for follow up for drain in approximately 2 weeks Contact information: [place] [address] [phone]          Wellington Hampshire, MD .   Specialty: Cardiology Contact information: [place] [address] [phone]              Contact information for after-discharge care     [facility] .   Service: Skilled Chiropodist information: [address] [place] [phone]  No Known Allergies    Consultations: General surgery, PCCM, IR   Other Procedures/Studies: CT ABDOMEN PELVIS WO CONTRAST  Result Date: 02/07/2021 CLINICAL DATA:  Left upper quadrant abscess status post percutaneous drainage. Decreased drain output. EXAM: CT ABDOMEN AND PELVIS WITHOUT CONTRAST TECHNIQUE: Multidetector CT imaging of the abdomen and pelvis was performed following the standard protocol without IV contrast. COMPARISON:  None. FINDINGS: Lower chest: Small left pleural effusion is unchanged. Bibasilar atelectasis with subtotal collapse of the left lower lobe is again noted. Central venous catheter tip noted within the right atrium. Cardiac size within normal limits. Moderate coronary artery calcification. Hepatobiliary: Cholelithiasis without superimposed pericholecystic inflammatory change. Liver unremarkable. No intra or extrahepatic biliary ductal dilation. Pancreas: Unremarkable Spleen: Status post splenectomy. Pigtail drainage catheter is seen within the splenectomy bed. Pericatheter fluid collection noted on prior examination has been completely evacuated. Small amount of residual pericatheter soft tissue in keeping with inflammatory tissue is noted. Adrenals/Urinary Tract: Adrenal glands are unremarkable. Kidneys are normal, without renal calculi, focal lesion, or hydronephrosis. Bladder is unremarkable. Stomach/Bowel: Status post appendectomy. Omental infiltration within the left upper quadrant and right lower quadrant is stable since prior examination, most in keeping  with postsurgical change. No discrete drainable fluid collection identified within the abdomen and pelvis. The rectal vault is fluid-filled, a finding that can present clinically as diarrhea. The stomach, small bowel, and large bowel are otherwise unremarkable. No evidence of obstruction. No free intraperitoneal gas or fluid. Vascular/Lymphatic: Aortic atherosclerosis. No enlarged abdominal or pelvic lymph nodes. Reproductive: Obscured by streak artifact from right hip arthroplasty. Other: Tiny fat containing umbilical hernia noted. Musculoskeletal: Status post right total hip arthroplasty. Degenerative changes are seen within the lumbar spine. No acute bone abnormality. IMPRESSION: Left upper quadrant percutaneous drainage catheter is unchanged in position. Previously noted pericatheter fluid collection has now completely resolved with a small amount of a pericatheter inflammatory tissue remaining. No loculated intra-abdominal fluid collection identified. Small left pleural effusion, likely reactive. Bibasilar atelectasis, left greater than right. Moderate coronary artery calcification. Cholelithiasis. Infiltration of the omentum, likely postoperative in nature related to splenectomy and appendectomy. Aortic Atherosclerosis (ICD10-I70.0). Electronically Signed   By: Fidela Salisbury M.D.   On: 02/07/2021 01:26   CT ABDOMEN PELVIS WO CONTRAST  Result Date: 01/23/2021 CLINICAL DATA:  Abdominal abscess, severe abdominal pain, EXAM: CT ABDOMEN AND PELVIS WITHOUT CONTRAST TECHNIQUE: Multidetector CT imaging of the abdomen and pelvis was performed following the standard protocol without IV contrast. COMPARISON:  None. FINDINGS: Lower chest: Small left pleural effusion. Bibasilar atelectasis, left greater than right. Moderate multi-vessel coronary artery calcification. Central venous catheter tip noted within the superior cavoatrial junction. Hepatobiliary: No focal liver abnormality is seen. No gallstones, gallbladder  wall thickening, or biliary dilatation. Pancreas: Unremarkable Spleen: Status post splenectomy. Percutaneous drainage catheter is again seen within the splenectomy resection bed with mixed attenuation material surrounding the catheter likely representing hemorrhagic components or residual contrast from drainage catheter placement or evaluation. Punctate foci of gas within this region are nonspecific and may relate to flushing of the catheter or evaluation. When measured in similar fashion, the dominant collection measures 5.5 x 7.4 cm, slightly decreased in size since prior examination. Adrenals/Urinary Tract: Adrenal glands are unremarkable. Kidneys are normal, without renal calculi, focal lesion, or hydronephrosis. Bladder is unremarkable. Stomach/Bowel: Appendectomy has been performed. Stomach, small bowel, and large bowel are otherwise unremarkable. No gross free intraperitoneal gas. Mild infiltration of the greater omentum is unchanged may reflect postsurgical or residual inflammatory change.  No loculated intra-abdominal fluid collections. No free intrapelvic fluid. Vascular/Lymphatic: Aortic atherosclerosis. No enlarged abdominal or pelvic lymph nodes. Reproductive: Streak artifact from right total hip arthroplasty. Partially obscures pelvic organs. Visualized prostate gland is unremarkable. Other: Laparotomy incision with wound VAC device placed within the a superficial aspect of the incision again noted. Punctate foci of gas noted within the right lower quadrant anterior abdominal wall. Mild subcutaneous edema noted within the flanks bilaterally in keeping with mild anasarca. Musculoskeletal: Right total hip arthroplasty has been performed. No acute bone abnormality. Degenerative changes are seen within the lumbar spine. IMPRESSION: Changes of splenectomy and left upper quadrant percutaneous drainage again identified. Slight interval decrease in size of dominant collection within the left upper quadrant.  High attenuation material and punctate foci of gas are nonspecific and may relate to recent catheter evaluation and/or flushing. Postsurgical changes of appendectomy with residual postoperative edema or inflammatory change within the greater omentum. No discrete drainable fluid collection identified. Small left pleural effusion, likely reactive. Associated bibasilar compressive atelectasis, left greater than right. Aortic Atherosclerosis (ICD10-I70.0). Electronically Signed   By: Fidela Salisbury M.D.   On: 01/23/2021 01:48   CT ABDOMEN PELVIS WO CONTRAST  Result Date: 01/19/2021 CLINICAL DATA:  Abdominal pain, history of ruptured appendicitis EXAM: CT ABDOMEN AND PELVIS WITHOUT CONTRAST TECHNIQUE: Multidetector CT imaging of the abdomen and pelvis was performed following the standard protocol without IV contrast. COMPARISON:  01/16/2021 FINDINGS: Lower chest: No acute abnormality. Hepatobiliary: No solid liver abnormality is seen. Small gallstone near the gallbladder neck (series 3, image 39). Gallbladder wall thickening, or biliary dilatation. Pancreas: Unremarkable. No pancreatic ductal dilatation or surrounding inflammatory changes. Spleen: Status post splenectomy. Interval placement of percutaneous pigtail drain catheters in the left upper quadrant within an air and fluid collection, the dominant component in which the catheter is placed measuring 7.9 x 6.6 cm, previously 8.5 x 6.2 cm (series 3, image 21). Adrenals/Urinary Tract: Adrenal glands are unremarkable. Kidneys are normal, without renal calculi, solid lesion, or hydronephrosis. Bladder is unremarkable. Stomach/Bowel: Stomach is within normal limits. Enteric contrast present in the colon. Redemonstrated postoperative findings status post appendectomy. There has been interval removal of a surgical drain in the right lower quadrant. Previously seen small, rim enhancing fluid collection in the right lower quadrant is not significantly changed, measuring  1.5 cm (series 3, image 65). Vascular/Lymphatic: Aortic atherosclerosis. No enlarged abdominal or pelvic lymph nodes. Reproductive: No mass or other significant abnormality. Other: No abdominal wall hernia or abnormality. No abdominopelvic ascites. Musculoskeletal: No acute or significant osseous findings. Status post right hip total arthroplasty. IMPRESSION: 1. Interval placement of percutaneous pigtail drain catheter in the left upper quadrant within an air and fluid collection, the dominant component in which the catheter is placed measuring 7.9 x 6.6 cm, previously 8.5 x 6.2 cm. 2. Previously seen small, rim enhancing fluid collection in the right lower quadrant is not significantly changed, measuring 1.5 cm. 3. Redemonstrated postoperative findings status post appendectomy and splenectomy. There has been interval removal of a surgical drain in the right lower quadrant. 4. Cholelithiasis. Aortic Atherosclerosis (ICD10-I70.0). Electronically Signed   By: Delanna Ahmadi M.D.   On: 01/19/2021 12:32   DG Chest 1 View  Result Date: 01/17/2021 CLINICAL DATA:  Status post left thoracentesis. EXAM: CHEST  1 VIEW COMPARISON:  Chest x-ray obtained earlier today FINDINGS: No evidence of pneumothorax. Reduced left pleural effusion. New percutaneous drainage catheter projects over the left upper quadrant. Left upper extremity PICC. Catheter tip terminates  over the right atrium. Very low lung volumes. Pulmonary vascular congestion. IMPRESSION: No evidence of pneumothorax or other complication following left-sided thoracentesis. Electronically Signed   By: Jacqulynn Cadet M.D.   On: 01/17/2021 16:33   DG Chest 1 View  Result Date: 01/17/2021 CLINICAL DATA:  Shortness of breath. EXAM: CHEST  1 VIEW COMPARISON:  January 16, 2021. FINDINGS: Stable cardiomediastinal silhouette. Left-sided PICC line is unchanged in position. Large left lung opacity is noted concerning for pneumonia or atelectasis with associated pleural  effusion. Right basilar subsegmental atelectasis is noted. Status post right shoulder arthroplasty. IMPRESSION: Large left lung opacity is again noted concerning for pneumonia or atelectasis with associated pleural effusion. Electronically Signed   By: Marijo Conception M.D.   On: 01/17/2021 09:30   DG Abd 1 View  Result Date: 01/17/2021 CLINICAL DATA:  Abdominal pain. EXAM: ABDOMEN - 1 VIEW COMPARISON:  January 15, 2021. FINDINGS: The bowel gas pattern is normal. No radio-opaque calculi or other significant radiographic abnormality are seen. IMPRESSION: Negative. Electronically Signed   By: Marijo Conception M.D.   On: 01/17/2021 09:31   DG Abd 1 View  Result Date: 01/15/2021 CLINICAL DATA:  Abdominal pain. EXAM: ABDOMEN - 1 VIEW COMPARISON:  01/10/2021 FINDINGS: Nasogastric 2 has been withdrawn or removed. Bowel gas pattern is nonobstructive. Residual contrast is identified in nondilated loops of colon. Moderate degenerative changes in the lumbar spine. Remote RIGHT hip arthroplasty. IMPRESSION: Nonobstructive bowel gas pattern. Residual contrast in nondilated colon. Electronically Signed   By: Nolon Nations M.D.   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 Patient now has systemic evidence of infection with a complex fluid collection versus hematoma in the left subdiaphragmatic space with an associated left-sided pleural effusion. He presents for thoracentesis and percutaneous abscess drain placement. EXAM: ULTRASOUND GUIDED ABSCESS DRAINAGE; US THORACENTESIS ASP PLEURAL SPACE W/IMG GUIDE MEDICATIONS: The patient is currently admitted to the hospital and receiving intravenous antibiotics. The antibiotics were administered within an appropriate time frame prior to the initiation of the procedure. ANESTHESIA/SEDATION:  Fentanyl 75 mcg IV; Versed 1.5 mg IV Moderate Sedation Time: 27 minutes The patient was continuously monitored during the procedure by the interventional radiology nurse under my direct supervision. COMPLICATIONS: None immediate. PROCEDURE: Informed written consent was obtained from the patient after a thorough discussion of the procedural risks, benefits and alternatives. All questions were addressed. Maximal Sterile Barrier Technique was utilized including caps, mask, sterile gowns, sterile gloves, sterile drape, hand hygiene and skin antiseptic. A timeout was performed prior to the initiation of the procedure. Attention was first turned to the right-sided pleural effusion. The right thorax was interrogated with ultrasound. A minimally complex pleural effusion is identified. Local anesthesia was attained by infiltration with 1% lidocaine. A small dermatotomy was made. Under real-time ultrasound guidance, a 5 [place] Yueh centesis catheter was advanced in the pleural space. The catheter was then attached to a evacuated bottle and thoracentesis was performed yielding approximately 600 mL serosanguineous fluid. A sample was reserved for labs. The Yueh centesis catheter was removed and a Band-Aid applied. Next, the left upper quadrant subdiaphragmatic collection was assessed sonographically. A highly complex fluid collection is present in the left upper quadrant. A suitable skin entry site was again selected and marked. Local anesthesia was attained by infiltration with 1% lidocaine. A small dermatotomy was made. Under real-time  ultrasound guidance, a 17 gauge introducer needle was advanced into the fluid collection. Through this, a 0.035 wire was advanced into the fluid collection. The introducer needle was removed. The tract was dilated to 1 [place]. A Cook 12 [place] all-purpose drainage catheter was advanced over the wire and into the fluid collection. Aspiration yields thick turbid bloody fluid. Approximately 40 mL  was successfully aspirated. The catheter was then connected to JP bulb suction and secured to the skin with 0 Prolene suture and an adhesive fixation device. The patient tolerated the procedure well. IMPRESSION: 1. Successful left thoracentesis yielding 600 mL serosanguineous pleural fluid. 2. Successful placement of a 12 French drainage catheter into the complex fluid collection in the left subdiaphragmatic space with aspiration of thick, turbid bloody fluid consistent with hematoma. A sample was sent for Gram stain and culture. Electronically Signed   By: Jacqulynn Cadet M.D.   On: 01/17/2021 16:32   CT ABDOMEN PELVIS W CONTRAST  Result Date: 01/16/2021 CLINICAL DATA:  65 year old male status post partial colon resection and splenectomy splenectomy site seroma/hematoma. Abdominal distension. EXAM: CT ABDOMEN AND PELVIS WITH CONTRAST TECHNIQUE: Multidetector CT imaging of the abdomen and pelvis was performed using the standard protocol following bolus administration of intravenous contrast. CONTRAST:  5mL OMNIPAQUE IOHEXOL 350 MG/ML SOLN COMPARISON:  CT Chest, Abdomen and Pelvis 01/12/2021 FINDINGS: Lower chest: Moderate left pleural effusion with simple fluid density but substantial compressive atelectasis of the left lower lobe has not regressed. No pericardial effusion. Right lower lobe consolidation persists, indeterminate for severe atelectasis (the parenchyma is enhancing) versus pneumonia. Middle lobes remain aerated. Hepatobiliary: Stable, negative. Pancreas: The pancreas remains within normal limits, although there is inflammation at the tail of the pancreas from the splenic region. Spleen: Splenectomy with complex mixed density and the larger component partially rim enhancing fluid collection in the left upper quadrant at the splenectomy bed. The larger component of organized collection there is 62 x 83 by 63 mm for an estimated volume of 162 mL. A smaller rim enhancing component is anterior and  lateral to the dominant collection as seen on series 3, image 23. Associated mass effect on the gastric cardia and proximal greater curve, and some of the fluid is inseparable from the gastric serosa (series 3, image 19). No gas within the collections. Adrenals/Urinary Tract: Urinary bladder partially obscured by streak artifact from the hip arthroplasty. Stable adrenal glands and kidneys. Symmetric renal enhancement and early contrast excretion on the delayed images. Stomach/Bowel: Oral contrast throughout the residual large bowel, which appears negative from the hepatic flexure distally. Right lower quadrant percutaneous drain with regional mesenteric inflammation, mild architectural distortion at the cecum. The small discontiguous rim enhancing fluid collections in the lower small bowel mesentery at the pelvic inlet persist and are rim enhancing today on series 3, image 68, but have not increased in size. Collectively these are less than 10 mL. Only mild inflammation of the distal small bowel now. No dilated small bowel. Duodenal diverticulum redemonstrated. Stable ventral midline abdominal postoperative changes. Mass effect and architectural distortion on the stomach is described with the spleen. No free air identified. Vascular/Lymphatic: Aortoiliac calcified atherosclerosis. Major arterial structures in the abdomen and pelvis remain patent. Portal venous system is patent. No lymphadenopathy identified. Reproductive: Negative. Other: No pelvic free fluid. Musculoskeletal: Advanced lumbar spine degeneration. Right hip arthroplasty. No acute osseous abnormality identified. IMPRESSION: 1. Splenectomy with organizing complex fluid collections in the left upper quadrant. The largest collection there is 162 mL. Smaller adjacent  collections, some inseparable from the gastric serosa and with associated mass effect and architectural distortion on the stomach. Again favor hematoma/seroma. Abscess is not excluded, but  there is no gas within the collections. 2. Stable small discontiguous rim enhancing fluid collections in the lower small bowel mesentery at the pelvic inlet a short distance from the percutaneous drain. These have not significantly changed and collectively are less than 10 mL. 3. Moderate left pleural effusion with substantial left lower lobe atelectasis persists. Right lower lobe consolidation persists, indeterminate for atelectasis versus pneumonia. 4. No bowel obstruction or new abnormality identified in the abdomen or pelvis. 5. Aortic Atherosclerosis (ICD10-I70.0). Electronically Signed   By: Genevie Ann M.D.   On: 01/16/2021 04:34   CT CHEST ABDOMEN PELVIS W CONTRAST  Result Date: 01/12/2021 CLINICAL DATA:  History of partial colon resection and splenectomy, chest pain, abdominal pain EXAM: CT CHEST, ABDOMEN, AND PELVIS WITH CONTRAST TECHNIQUE: Multidetector CT imaging of the chest, abdomen and pelvis was performed following the standard protocol during bolus administration of intravenous contrast. CONTRAST:  155mL OMNIPAQUE IOHEXOL 300 MG/ML  SOLN COMPARISON:  01/06/2021, 01/01/2021 FINDINGS: CT CHEST FINDINGS Cardiovascular: The heart is unremarkable without pericardial effusion. No evidence of thoracic aortic aneurysm or dissection. Minimal atherosclerosis. There is a left subclavian central venous catheter tip within the superior vena cava. Mediastinum/Nodes: Borderline enlarged mediastinal lymph nodes measure up to 12 mm in the precarinal region, likely reactive. Thyroid, trachea, and esophagus are unremarkable. Lungs/Pleura: There is aim moderate left pleural effusion, volume estimated less than 1 L. Dense left lower lobe atelectasis is also noted. Dependent right lower lobe atelectasis is also seen. No pneumothorax. Central airways are patent. Musculoskeletal: There are no acute or destructive bony lesions. Reconstructed images demonstrate no additional findings. CT ABDOMEN PELVIS FINDINGS  Hepatobiliary: Small calcified gallstones are again identified without evidence of cholecystitis. The liver is unremarkable. No intrahepatic biliary duct dilation. Pancreas: Unremarkable. No pancreatic ductal dilatation or surrounding inflammatory changes. 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 Residual hematoma within the splenectomy bed measures 4.9 x 7.8 cm. Adrenals/Urinary Tract: The kidneys enhance normally and symmetrically. No urinary tract calculi or obstructive uropathy. The adrenals and bladder are unremarkable. Stomach/Bowel: Postsurgical changes are seen from cecal resection with ileocolic anastomosis. No evidence of bowel obstruction or ileus. No bowel wall thickening or inflammatory change. Vascular/Lymphatic: Aortic atherosclerosis. No enlarged abdominal or pelvic lymph nodes. Reproductive: Prostate is unremarkable. Other: Postsurgical changes are seen from prior midline laparotomy. A surgical drain enters the abdomen in the right lower quadrant, tip within the left hemipelvis. A small fluid collection in the left upper quadrant adjacent to the post splenectomy hematoma measures 5.9 x 4.0 cm image 55/3, with a punctate focus of internal gas, likely representing postoperative seroma. This does not demonstrate any rim enhancement to suggest abscess. There is trace free fluid within the lower abdomen and pelvis. A small localized fluid collection in the right lower quadrant image 100/3 measures 2.4 x 1.9 cm, indeterminate at this time. This could reflect organizing phlegmon or developing abscess, and continued follow-up is recommended. Musculoskeletal: Stable right hip arthroplasty. There are no acute or destructive bony lesions. Reconstructed images demonstrate no additional findings. IMPRESSION: 1. 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 2. Indeterminate 2.4 cm fluid collection right lower quadrant adjacent to the cecal resection site. This could reflect organizing  phlegmon, and continued follow-up is recommended. 3. Likely postoperative seroma and residual hematoma at the splenectomy surgical site. No evidence of abscess at this  location. 4. Moderate left pleural effusion with underlying compressive left lower lobe atelectasis. 5. Cholelithiasis without cholecystitis. 6.  Aortic Atherosclerosis (ICD10-I70.0). Electronically Signed   By: Randa Ngo M.D.   On: 01/12/2021 15:49   DG Chest Port 1 View  Result Date: 01/21/2021 CLINICAL DATA:  Shortness of breath EXAM: PORTABLE CHEST 1 VIEW COMPARISON:  Two days ago FINDINGS: Low volume chest with artifact from EKG leads. Left PICC with tip at the upper right atrium. Left upper quadrant drain. Normal heart size and stable mediastinal contours. Unchanged haziness of the left more than right chest with atelectasis and pleural fluid. IMPRESSION: Stable atelectasis and pleural fluid asymmetric to the left. Electronically Signed   By: Jorje Guild M.D.   On: 01/21/2021 08:04   DG Chest Port 1 View  Result Date: 01/19/2021 CLINICAL DATA:  Shortness of breath. EXAM: PORTABLE CHEST 1 VIEW COMPARISON:  January 17, 2021. FINDINGS: Stable cardiomediastinal silhouette. Left-sided pleural drainage catheter is unchanged in position. Stable small left pleural effusion is noted with associated left basilar atelectasis. Mild right basilar atelectasis is noted as well. Bony thorax is unremarkable. PICC line is unchanged. No pneumothorax is noted. IMPRESSION: Stable left-sided pleural drainage catheter is noted with associated left pleural effusion and adjacent atelectasis. Stable mild right basilar atelectasis is noted. Electronically Signed   By: Marijo Conception M.D.   On: 01/19/2021 08:01   DG Chest Port 1 View  Result Date: 01/16/2021 CLINICAL DATA:  Shortness of breath.  Asthma. EXAM: PORTABLE CHEST 1 VIEW COMPARISON:  01/14/2020 FINDINGS: Left arm PICC is been placed with the tip in the mid right atrium. Withdraw 4 cm to  be above the right atrium if desired. Artifact overlies the chest otherwise. Hazy opacity overlying the left chest probably relates to a layering effusion with associated dependent atelectasis. Mild atelectasis present in the right lower lobe. IMPRESSION: Left arm PICC tip in the right atrium. Withdraw 4 cm be above the right atrium if desired. Layering pleural effusion on the left with atelectatic changes of the left lung. Lesser patchy atelectasis at the right lung base. Electronically Signed   By: Nelson Chimes M.D.   On: 01/16/2021 08:38   DG CHEST PORT 1 VIEW  Result Date: 01/13/2021 CLINICAL DATA:  Respiratory distress EXAM: PORTABLE CHEST 1 VIEW COMPARISON:  01/10/2021 FINDINGS: Low lung volumes. Moderate left and small right pleural effusions. No frank interstitial edema. No pneumothorax. The heart is normal in size. Left subclavian venous catheter terminates in the mid SVC. Right shoulder arthroplasty. IMPRESSION: Moderate left and small right pleural effusions. No frank interstitial edema. Electronically Signed   By: Julian Hy M.D.   On: 01/13/2021 02:34   DG Chest Port 1V same Day  Result Date: 02/07/2021 CLINICAL DATA:  Shortness of breath EXAM: PORTABLE CHEST 1 VIEW COMPARISON:  01/21/2021 FINDINGS: Transverse diameter of heart is slightly increased. Thoracic aorta is tortuous and ectatic. Tip of PICC line is seen in the right atrium. There are no signs of alveolar pulmonary edema. There is improvement in aeration of left parahilar region and both lower lung fields. Residual patchy infiltrates are seen in the left lower lung fields. Linear density in the right parahilar region may suggest fluid in the interlobar fissure or subsegmental atelectasis. Left chest tube is noted. There is no pneumothorax. IMPRESSION: There is interval improvement in aeration of mid and lower lung fields suggesting resolving atelectasis/pneumonia. Small left pleural effusion. Electronically Signed   By: Prudy Feeler.D.  On: 02/07/2021 11:51   DG Abd Portable 1V  Result Date: 01/10/2021 CLINICAL DATA:  NG tube placement EXAM: PORTABLE ABDOMEN - 1 VIEW COMPARISON:  KUB 01/06/2021 FINDINGS: The enteric catheter tip and side hole project over the stomach. There is a nonobstructive bowel gas pattern. Enteric contrast is seen in the right colon, partially imaged. The lungs are better assessed on the same day chest radiograph. IMPRESSION: NG tube tip and side hole in the stomach. Electronically Signed   By: Valetta Mole M.D.   On: 01/10/2021 16:39   VAS [place] LOWER EXTREMITY VENOUS (DVT)  Result Date: 02/07/2021  Lower Venous DVT Study Patient Name:  CHEIKH BRAMBLE Lynes  Date of Exam:   02/07/2021 Medical Rec #: [id number]      Accession #:    [id number] Date of Birth: Jul 04, 1955     Patient Gender: M Patient Age:   86 years Exam Location:  [facility] Procedure:      VAS [place] LOWER EXTREMITY VENOUS (DVT) Referring Phys: Oren Binet --------------------------------------------------------------------------------  Indications: Bilateral knee pain.  Comparison Study: No prior studies. Performing Technologist: Darlin Coco RDMS, RVT  Examination Guidelines: A complete evaluation includes B-mode imaging, spectral Doppler, color Doppler, and power Doppler as needed of all accessible portions of each vessel. Bilateral testing is considered an integral part of a complete examination. Limited examinations for reoccurring indications may be performed as noted. The reflux portion of the exam is performed with the patient in reverse Trendelenburg.  +---------+---------------+---------+-----------+----------+--------------+ RIGHT    CompressibilityPhasicitySpontaneityPropertiesThrombus Aging +---------+---------------+---------+-----------+----------+--------------+ CFV      Full           Yes      Yes                                  +---------+---------------+---------+-----------+----------+--------------+ SFJ      Full                                                        +---------+---------------+---------+-----------+----------+--------------+ FV Prox  Full                                                        +---------+---------------+---------+-----------+----------+--------------+ FV Mid   Full                                                        +---------+---------------+---------+-----------+----------+--------------+ FV DistalFull                                                        +---------+---------------+---------+-----------+----------+--------------+ PFV      Full                                                        +---------+---------------+---------+-----------+----------+--------------+  POP      Full           Yes      Yes                                 +---------+---------------+---------+-----------+----------+--------------+ PTV      Full                                                        +---------+---------------+---------+-----------+----------+--------------+ PERO     Full                                                        +---------+---------------+---------+-----------+----------+--------------+ Gastroc  Full                                                        +---------+---------------+---------+-----------+----------+--------------+   +---------+---------------+---------+-----------+----------+--------------+ LEFT     CompressibilityPhasicitySpontaneityPropertiesThrombus Aging +---------+---------------+---------+-----------+----------+--------------+ CFV      Full           Yes      Yes                                 +---------+---------------+---------+-----------+----------+--------------+ SFJ      Full                                                         +---------+---------------+---------+-----------+----------+--------------+ FV Prox  Full                                                        +---------+---------------+---------+-----------+----------+--------------+ FV Mid   Full                                                        +---------+---------------+---------+-----------+----------+--------------+ FV DistalFull                                                        +---------+---------------+---------+-----------+----------+--------------+ PFV      Full                                                        +---------+---------------+---------+-----------+----------+--------------+  POP      Full           Yes      Yes                                 +---------+---------------+---------+-----------+----------+--------------+ PTV      Full                                                        +---------+---------------+---------+-----------+----------+--------------+ PERO     Full                                                        +---------+---------------+---------+-----------+----------+--------------+ Gastroc  Full                                                        +---------+---------------+---------+-----------+----------+--------------+     Summary: RIGHT: - There is no evidence of deep vein thrombosis in the lower extremity.  - A cystic structure is found in the popliteal fossa.  LEFT: - There is no evidence of deep vein thrombosis in the lower extremity.  - No cystic structure found in the popliteal fossa.  *See table(s) above for measurements and observations. Electronically signed by Deitra Mayo MD on 02/07/2021 at 3:40:19 PM.    Final    VAS [place] UPPER EXTREMITY VENOUS DUPLEX  Result Date: 02/08/2021 UPPER VENOUS STUDY  Patient Name:  HONORIO DEVOL Mates  Date of Exam:   02/08/2021 Medical Rec #: [id number]      Accession #:    [id number] Date of Birth: 05/09/1955      Patient Gender: M Patient Age:   80 years Exam Location:  [facility] Procedure:      VAS [place] UPPER EXTREMITY VENOUS DUPLEX Referring Phys: Barkley Boards --------------------------------------------------------------------------------  Indications: Recent PICC LT arm, fever Comparison Study: No prior upper extremity studies. Performing Technologist: Darlin Coco RDMS, RVT  Examination Guidelines: A complete evaluation includes B-mode imaging, spectral Doppler, color Doppler, and power Doppler as needed of all accessible portions of each vessel. Bilateral testing is considered an integral part of a complete examination. Limited examinations for reoccurring indications may be performed as noted.  Right Findings: +----------+------------+---------+-----------+----------+-------+ RIGHT     CompressiblePhasicitySpontaneousPropertiesSummary +----------+------------+---------+-----------+----------+-------+ IJV           Full       Yes       Yes                      +----------+------------+---------+-----------+----------+-------+ Subclavian    Full       Yes       Yes                      +----------+------------+---------+-----------+----------+-------+ Axillary      Full       Yes       Yes                      +----------+------------+---------+-----------+----------+-------+  Brachial      Full                                          +----------+------------+---------+-----------+----------+-------+ Radial        Full                                          +----------+------------+---------+-----------+----------+-------+ Ulnar         Full                                          +----------+------------+---------+-----------+----------+-------+ Cephalic      Full                                          +----------+------------+---------+-----------+----------+-------+ Basilic       Full                                           +----------+------------+---------+-----------+----------+-------+  Left Findings: +----------+------------+---------+-----------+-----------------+--------------+ LEFT      CompressiblePhasicitySpontaneous   Properties       Summary     +----------+------------+---------+-----------+-----------------+--------------+ IJV           Full       Yes       Yes                                    +----------+------------+---------+-----------+-----------------+--------------+ Subclavian    Full       Yes       Yes                                    +----------+------------+---------+-----------+-----------------+--------------+ Axillary      Full       Yes       Yes                                    +----------+------------+---------+-----------+-----------------+--------------+ Brachial    Partial      Yes       Yes     Appears loosely     Acute                                                    attached                    +----------+------------+---------+-----------+-----------------+--------------+ Radial                   Yes       Yes  No                                                                   compressions                                                               performed                                                               distal to DVT  +----------+------------+---------+-----------+-----------------+--------------+ Ulnar                    Yes       Yes                           No                                                                   compressions                                                               performed                                                               distal to DVT  +----------+------------+---------+-----------+-----------------+--------------+ Cephalic      Full                                                         +----------+------------+---------+-----------+-----------------+--------------+ Basilic       Full                                                        +----------+------------+---------+-----------+-----------------+--------------+  Summary:  Right: No evidence of deep vein thrombosis in the upper extremity. No evidence of superficial vein thrombosis in the  upper extremity.  Left: Findings consistent with acute deep vein thrombosis involving the left brachial veins. No evidence of superficial vein thrombosis in the upper extremity.  *See table(s) above for measurements and observations.  Diagnosing physician: Harold Barban MD Electronically signed by Harold Barban MD on 02/08/2021 at 8:54:08 PM.    Final    [place] EKG SITE RITE  Result Date: 01/13/2021 If Site Rite image not attached, placement could not be confirmed due to current cardiac rhythm.  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 Patient now has systemic evidence of infection with a complex fluid collection versus hematoma in the left subdiaphragmatic space with an associated left-sided pleural effusion. He presents for thoracentesis and percutaneous abscess drain placement. EXAM: ULTRASOUND GUIDED ABSCESS DRAINAGE; US THORACENTESIS ASP PLEURAL SPACE W/IMG GUIDE MEDICATIONS: The patient is currently admitted to the hospital and receiving intravenous antibiotics. The antibiotics were administered within an appropriate time frame prior to the initiation of the procedure. ANESTHESIA/SEDATION: Fentanyl 75 mcg IV; Versed 1.5 mg IV Moderate Sedation Time: 27 minutes The patient was continuously monitored during the procedure by the interventional radiology nurse under my direct supervision. COMPLICATIONS: None immediate. PROCEDURE: Informed written  consent was obtained from the patient after a thorough discussion of the procedural risks, benefits and alternatives. All questions were addressed. Maximal Sterile Barrier Technique was utilized including caps, mask, sterile gowns, sterile gloves, sterile drape, hand hygiene and skin antiseptic. A timeout was performed prior to the initiation of the procedure. Attention was first turned to the right-sided pleural effusion. The right thorax was interrogated with ultrasound. A minimally complex pleural effusion is identified. Local anesthesia was attained by infiltration with 1% lidocaine. A small dermatotomy was made. Under real-time ultrasound guidance, a 5 [place] Yueh centesis catheter was advanced in the pleural space. The catheter was then attached to a evacuated bottle and thoracentesis was performed yielding approximately 600 mL serosanguineous fluid. A sample was reserved for labs. The Yueh centesis catheter was removed and a Band-Aid applied. Next, the left upper quadrant subdiaphragmatic collection was assessed sonographically. A highly complex fluid collection is present in the left upper quadrant. A suitable skin entry site was again selected and marked. Local anesthesia was attained by infiltration with 1% lidocaine. A small dermatotomy was made. Under real-time ultrasound guidance, a 17 gauge introducer needle was advanced into the fluid collection. Through this, a 0.035 wire was advanced into the fluid collection. The introducer needle was removed. The tract was dilated to 78 [place]. A Cook 12 [place] all-purpose drainage catheter was advanced over the wire and into the fluid collection. Aspiration yields thick turbid bloody fluid. Approximately 40 mL was successfully aspirated. The catheter was then connected to JP bulb suction and secured to the skin with 0 Prolene suture and an adhesive fixation device. The patient tolerated the procedure well. IMPRESSION: 1. Successful left thoracentesis yielding 600  mL serosanguineous pleural fluid. 2. Successful placement of a 12 French drainage catheter into the complex fluid collection in the left subdiaphragmatic space with aspiration of thick, turbid bloody fluid consistent with hematoma. A sample was sent for Gram stain and culture. Electronically Signed   By: Jacqulynn Cadet M.D.   On: 01/17/2021 16:32     TODAY-DAY OF DISCHARGE:  Subjective:   [name] today has no headache,no chest abdominal pain,no new weakness tingling or numbness, feels much better  wants to go home today.   Objective:   Blood pressure 126/71, pulse 92, temperature 99.1 F (37.3 C), temperature source Oral, resp. rate 20, height 6' (1.829 m), weight 78.7 kg, SpO2 94 %.  Intake/Output Summary (Last 24 hours) at 02/09/2021 0936 Last data filed at 02/09/2021 0610 Gross per 24 hour  Intake 360 ml  Output 700 ml  Net -340 ml   Filed Weights   02/05/21 0500 02/06/21 0431 02/08/21 0500  Weight: 81.2 kg 79.5 kg 78.7 kg    Exam: Awake Alert, Oriented *3, No new F.N deficits, Normal affect [place].AT,PERRAL Supple Neck,No JVD, No cervical lymphadenopathy appriciated.  Symmetrical Chest wall movement, Good air movement bilaterally, CTAB RRR,No Gallops,Rubs or new Murmurs, No Parasternal Heave +ve B.Sounds, Abd Soft, Non tender, No organomegaly appriciated, No rebound -guarding or rigidity. No Cyanosis, Clubbing or edema, No new Rash or bruise   PERTINENT RADIOLOGIC STUDIES: DG Chest Port 1V same Day  Result Date: 02/07/2021 CLINICAL DATA:  Shortness of breath EXAM: PORTABLE CHEST 1 VIEW COMPARISON:  01/21/2021 FINDINGS: Transverse diameter of heart is slightly increased. Thoracic aorta is tortuous and ectatic. Tip of PICC line is seen in the right atrium. There are no signs of alveolar pulmonary edema. There is improvement in aeration of left parahilar region and both lower lung fields. Residual patchy infiltrates are seen in the left lower lung fields. Linear density in  the right parahilar region may suggest fluid in the interlobar fissure or subsegmental atelectasis. Left chest tube is noted. There is no pneumothorax. IMPRESSION: There is interval improvement in aeration of mid and lower lung fields suggesting resolving atelectasis/pneumonia. Small left pleural effusion. Electronically Signed   By: Elmer Picker M.D.   On: 02/07/2021 11:51   VAS [place] LOWER EXTREMITY VENOUS (DVT)  Result Date: 02/07/2021  Lower Venous DVT Study Patient Name:  DAYN BARICH Kapfer  Date of Exam:   02/07/2021 Medical Rec #: [id number]      Accession #:    [id number] Date of Birth: 09-26-1955     Patient Gender: M Patient Age:   58 years Exam Location:  [facility] Procedure:      VAS [place] LOWER EXTREMITY VENOUS (DVT) Referring Phys: Oren Binet --------------------------------------------------------------------------------  Indications: Bilateral knee pain.  Comparison Study: No prior studies. Performing Technologist: Darlin Coco RDMS, RVT  Examination Guidelines: A complete evaluation includes B-mode imaging, spectral Doppler, color Doppler, and power Doppler as needed of all accessible portions of each vessel. Bilateral testing is considered an integral part of a complete examination. Limited examinations for reoccurring indications may be performed as noted. The reflux portion of the exam is performed with the patient in reverse Trendelenburg.  +---------+---------------+---------+-----------+----------+--------------+ RIGHT    CompressibilityPhasicitySpontaneityPropertiesThrombus Aging +---------+---------------+---------+-----------+----------+--------------+ CFV      Full           Yes      Yes                                 +---------+---------------+---------+-----------+----------+--------------+ SFJ      Full                                                        +---------+---------------+---------+-----------+----------+--------------+ FV Prox  Full                                                         +---------+---------------+---------+-----------+----------+--------------+  FV Mid   Full                                                        +---------+---------------+---------+-----------+----------+--------------+ FV DistalFull                                                        +---------+---------------+---------+-----------+----------+--------------+ PFV      Full                                                        +---------+---------------+---------+-----------+----------+--------------+ POP      Full           Yes      Yes                                 +---------+---------------+---------+-----------+----------+--------------+ PTV      Full                                                        +---------+---------------+---------+-----------+----------+--------------+ PERO     Full                                                        +---------+---------------+---------+-----------+----------+--------------+ Gastroc  Full                                                        +---------+---------------+---------+-----------+----------+--------------+   +---------+---------------+---------+-----------+----------+--------------+ LEFT     CompressibilityPhasicitySpontaneityPropertiesThrombus Aging +---------+---------------+---------+-----------+----------+--------------+ CFV      Full           Yes      Yes                                 +---------+---------------+---------+-----------+----------+--------------+ SFJ      Full                                                        +---------+---------------+---------+-----------+----------+--------------+ FV Prox  Full                                                        +---------+---------------+---------+-----------+----------+--------------+  FV Mid   Full                                                         +---------+---------------+---------+-----------+----------+--------------+ FV DistalFull                                                        +---------+---------------+---------+-----------+----------+--------------+ PFV      Full                                                        +---------+---------------+---------+-----------+----------+--------------+ POP      Full           Yes      Yes                                 +---------+---------------+---------+-----------+----------+--------------+ PTV      Full                                                        +---------+---------------+---------+-----------+----------+--------------+ PERO     Full                                                        +---------+---------------+---------+-----------+----------+--------------+ Gastroc  Full                                                        +---------+---------------+---------+-----------+----------+--------------+     Summary: RIGHT: - There is no evidence of deep vein thrombosis in the lower extremity.  - A cystic structure is found in the popliteal fossa.  LEFT: - There is no evidence of deep vein thrombosis in the lower extremity.  - No cystic structure found in the popliteal fossa.  *See table(s) above for measurements and observations. Electronically signed by Deitra Mayo MD on 02/07/2021 at 3:40:19 PM.    Final    VAS [place] UPPER EXTREMITY VENOUS DUPLEX  Result Date: 02/08/2021 UPPER VENOUS STUDY  Patient Name:  ZAIM NITTA Pfund  Date of Exam:   02/08/2021 Medical Rec #: [id number]      Accession #:    [id number] Date of Birth: 11/16/1955     Patient Gender: M Patient Age:   4 years Exam Location:  [facility] Procedure:      VAS [place] UPPER EXTREMITY VENOUS DUPLEX Referring Phys: Barkley Boards --------------------------------------------------------------------------------  Indications: Recent PICC LT arm, fever Comparison Study:  No prior upper  extremity studies. Performing Technologist: Darlin Coco RDMS, RVT  Examination Guidelines: A complete evaluation includes B-mode imaging, spectral Doppler, color Doppler, and power Doppler as needed of all accessible portions of each vessel. Bilateral testing is considered an integral part of a complete examination. Limited examinations for reoccurring indications may be performed as noted.  Right Findings: +----------+------------+---------+-----------+----------+-------+ RIGHT     CompressiblePhasicitySpontaneousPropertiesSummary +----------+------------+---------+-----------+----------+-------+ IJV           Full       Yes       Yes                      +----------+------------+---------+-----------+----------+-------+ Subclavian    Full       Yes       Yes                      +----------+------------+---------+-----------+----------+-------+ Axillary      Full       Yes       Yes                      +----------+------------+---------+-----------+----------+-------+ Brachial      Full                                          +----------+------------+---------+-----------+----------+-------+ Radial        Full                                          +----------+------------+---------+-----------+----------+-------+ Ulnar         Full                                          +----------+------------+---------+-----------+----------+-------+ Cephalic      Full                                          +----------+------------+---------+-----------+----------+-------+ Basilic       Full                                          +----------+------------+---------+-----------+----------+-------+  Left Findings: +----------+------------+---------+-----------+-----------------+--------------+ LEFT      CompressiblePhasicitySpontaneous   Properties       Summary      +----------+------------+---------+-----------+-----------------+--------------+ IJV           Full       Yes       Yes                                    +----------+------------+---------+-----------+-----------------+--------------+ Subclavian    Full       Yes       Yes                                    +----------+------------+---------+-----------+-----------------+--------------+ Axillary      Full  Yes       Yes                                    +----------+------------+---------+-----------+-----------------+--------------+ Brachial    Partial      Yes       Yes     Appears loosely     Acute                                                    attached                    +----------+------------+---------+-----------+-----------------+--------------+ Radial                   Yes       Yes                           No                                                                   compressions                                                               performed                                                               distal to DVT  +----------+------------+---------+-----------+-----------------+--------------+ Ulnar                    Yes       Yes                           No                                                                   compressions                                                               performed  distal to DVT  +----------+------------+---------+-----------+-----------------+--------------+ Cephalic      Full                                                        +----------+------------+---------+-----------+-----------------+--------------+ Basilic       Full                                                        +----------+------------+---------+-----------+-----------------+--------------+   Summary:  Right: No evidence of deep vein thrombosis in the upper extremity. No evidence of superficial vein thrombosis in the upper extremity.  Left: Findings consistent with acute deep vein thrombosis involving the left brachial veins. No evidence of superficial vein thrombosis in the upper extremity.  *See table(s) above for measurements and observations.  Diagnosing physician: Harold Barban MD Electronically signed by Harold Barban MD on 02/08/2021 at 8:54:08 PM.    Final      PERTINENT LAB RESULTS: CBC: Recent Labs    02/08/21 0052  WBC 8.2  HGB 8.6*  HCT 28.2*  PLT 521*   CMET CMP     Component Value Date/Time   NA 137 02/08/2021 0052   NA 138 06/20/2020 1034   NA 141 04/20/2014 1019   K 4.0 02/08/2021 0052   K 3.7 04/20/2014 1019   CL 107 02/08/2021 0052   CL 106 04/20/2014 1019   CO2 22 02/08/2021 0052   CO2 30 04/20/2014 1019   GLUCOSE 97 02/08/2021 0052   GLUCOSE 102 (H) 04/20/2014 1019   BUN 16 02/08/2021 0052   BUN 18 06/20/2020 1034   BUN 14 04/20/2014 1019   CREATININE 1.08 02/08/2021 0052   CREATININE 1.18 04/20/2014 1019   CALCIUM 9.3 02/08/2021 0052   CALCIUM 9.3 04/20/2014 1019   PROT 6.9 02/08/2021 0052   PROT 6.5 06/18/2017 1425   PROT 7.4 04/20/2014 1019   ALBUMIN 2.2 (L) 02/08/2021 0052   ALBUMIN 4.0 06/18/2017 1425   ALBUMIN 3.4 04/20/2014 1019   AST 21 02/08/2021 0052   AST 21 04/20/2014 1019   ALT 13 02/08/2021 0052   ALT 20 04/20/2014 1019   ALKPHOS 71 02/08/2021 0052   ALKPHOS 97 04/20/2014 1019   BILITOT 0.6 02/08/2021 0052   BILITOT 0.4 06/18/2017 1425   BILITOT 0.6 04/20/2014 1019   GFRNONAA >60 02/08/2021 0052   GFRNONAA >60 04/20/2014 1019   GFRNONAA >60 11/19/2012 1107   GFRAA 53 (L) 01/13/2020 1410   GFRAA >60 04/20/2014 1019   GFRAA >60 11/19/2012 1107    GFR Estimated Creatinine Clearance: 74.8 mL/min (by C-G formula based on SCr of 1.08 mg/dL). No results for input(s): LIPASE, AMYLASE in the last 72 hours. No results  for input(s): CKTOTAL, CKMB, CKMBINDEX, TROPONINI in the last 72 hours. Invalid input(s): POCBNP No results for input(s): DDIMER in the last 72 hours. No results for input(s): HGBA1C in the last 72 hours. No results for input(s): CHOL, HDL, LDLCALC, TRIG, CHOLHDL, LDLDIRECT in the last 72 hours. No results for input(s): TSH, T4TOTAL, T3FREE, THYROIDAB in the last 72 hours.  Invalid input(s): FREET3 No results for input(s): VITAMINB12, FOLATE, FERRITIN, TIBC, IRON, RETICCTPCT in the last 72 hours. Coags:  No results for input(s): INR in the last 72 hours.  Invalid input(s): PT Microbiology: Recent Results (from the past 240 hour(s))  Resp Panel by RT-PCR (Flu A&B, Covid) Nasopharyngeal Swab     Status: None   Collection Time: 02/03/21 11:13 AM   Specimen: Nasopharyngeal Swab; Nasopharyngeal(NP) swabs in vial transport medium  Result Value Ref Range Status   SARS Coronavirus 2 by RT PCR NEGATIVE NEGATIVE Final    Comment: (NOTE) SARS-CoV-2 target nucleic acids are NOT DETECTED.  The SARS-CoV-2 RNA is generally detectable in upper respiratory specimens during the acute phase of infection. The lowest concentration of SARS-CoV-2 viral copies this assay can detect is 138 copies/mL. A negative result does not preclude SARS-Cov-2 infection and should not be used as the sole basis for treatment or other patient management decisions. A negative result may occur with  improper specimen collection/handling, submission of specimen other than nasopharyngeal swab, presence of viral mutation(s) within the areas targeted by this assay, and inadequate number of viral copies(<138 copies/mL). A negative result must be combined with clinical observations, patient history, and epidemiological information. The expected result is Negative.  Fact Sheet for Patients:  EntrepreneurPulse.com.au  Fact Sheet for Healthcare Providers:  IncredibleEmployment.be  This test  is no t yet approved or cleared by the [place] FDA and  has been authorized for detection and/or diagnosis of SARS-CoV-2 by FDA under an Emergency Use Authorization (EUA). This EUA will remain  in effect (meaning this test can be used) for the duration of the COVID-19 declaration under Section 564(b)(1) of the Act, 21 U.S.C.section 360bbb-3(b)(1), unless the authorization is terminated  or revoked sooner.       Influenza A by PCR NEGATIVE NEGATIVE Final   Influenza B by PCR NEGATIVE NEGATIVE Final    Comment: (NOTE) The Xpert Xpress SARS-CoV-2/FLU/RSV plus assay is intended as an aid in the diagnosis of influenza from Nasopharyngeal swab specimens and should not be used as a sole basis for treatment. Nasal washings and aspirates are unacceptable for Xpert Xpress SARS-CoV-2/FLU/RSV testing.  Fact Sheet for Patients: EntrepreneurPulse.com.au  Fact Sheet for Healthcare Providers: IncredibleEmployment.be  This test is not yet approved or cleared by the [place] FDA and has been authorized for detection and/or diagnosis of SARS-CoV-2 by FDA under an Emergency Use Authorization (EUA). This EUA will remain in effect (meaning this test can be used) for the duration of the COVID-19 declaration under Section 564(b)(1) of the Act, 21 U.S.C. section 360bbb-3(b)(1), unless the authorization is terminated or revoked.  Performed at [facility], [place] [address], [place], [place] [address]   Culture, blood (routine x 2)     Status: None (Preliminary result)   Collection Time: 02/07/21  2:45 AM   Specimen: BLOOD  Result Value Ref Range Status   Specimen Description BLOOD RIGHT ARM  Final   Special Requests   Final    BOTTLES DRAWN AEROBIC ONLY Blood Culture adequate volume   Culture   Final    NO GROWTH 1 DAY Performed at [facility], [place] [address] [place], [place] [address]    Report Status PENDING  Incomplete  Culture, blood  (routine x 2)     Status: None (Preliminary result)   Collection Time: 02/07/21  2:45 AM   Specimen: BLOOD RIGHT WRIST  Result Value Ref Range Status   Specimen Description BLOOD RIGHT WRIST  Final   Special Requests   Final    BOTTLES DRAWN AEROBIC ONLY Blood Culture  adequate volume   Culture   Final    NO GROWTH 1 DAY Performed at [facility], [place] [address] [place], [place] [address]    Report Status PENDING  Incomplete  Resp Panel by RT-PCR (Flu A&B, Covid) Nasopharyngeal Swab     Status: None   Collection Time: 02/08/21  2:28 PM   Specimen: Nasopharyngeal Swab; Nasopharyngeal(NP) swabs in vial transport medium  Result Value Ref Range Status   SARS Coronavirus 2 by RT PCR NEGATIVE NEGATIVE Final    Comment: (NOTE) SARS-CoV-2 target nucleic acids are NOT DETECTED.  The SARS-CoV-2 RNA is generally detectable in upper respiratory specimens during the acute phase of infection. The lowest concentration of SARS-CoV-2 viral copies this assay can detect is 138 copies/mL. A negative result does not preclude SARS-Cov-2 infection and should not be used as the sole basis for treatment or other patient management decisions. A negative result may occur with  improper specimen collection/handling, submission of specimen other than nasopharyngeal swab, presence of viral mutation(s) within the areas targeted by this assay, and inadequate number of viral copies(<138 copies/mL). A negative result must be combined with clinical observations, patient history, and epidemiological information. The expected result is Negative.  Fact Sheet for Patients:  EntrepreneurPulse.com.au  Fact Sheet for Healthcare Providers:  IncredibleEmployment.be  This test is no t yet approved or cleared by the [place] FDA and  has been authorized for detection and/or diagnosis of SARS-CoV-2 by FDA under an Emergency Use Authorization (EUA). This EUA will remain  in  effect (meaning this test can be used) for the duration of the COVID-19 declaration under Section 564(b)(1) of the Act, 21 U.S.C.section 360bbb-3(b)(1), unless the authorization is terminated  or revoked sooner.       Influenza A by PCR NEGATIVE NEGATIVE Final   Influenza B by PCR NEGATIVE NEGATIVE Final    Comment: (NOTE) The Xpert Xpress SARS-CoV-2/FLU/RSV plus assay is intended as an aid in the diagnosis of influenza from Nasopharyngeal swab specimens and should not be used as a sole basis for treatment. Nasal washings and aspirates are unacceptable for Xpert Xpress SARS-CoV-2/FLU/RSV testing.  Fact Sheet for Patients: EntrepreneurPulse.com.au  Fact Sheet for Healthcare Providers: IncredibleEmployment.be  This test is not yet approved or cleared by the [place] FDA and has been authorized for detection and/or diagnosis of SARS-CoV-2 by FDA under an Emergency Use Authorization (EUA). This EUA will remain in effect (meaning this test can be used) for the duration of the COVID-19 declaration under Section 564(b)(1) of the Act, 21 U.S.C. section 360bbb-3(b)(1), unless the authorization is terminated or revoked.  Performed at [facility], [place] [address], [place], [place] [address]     FURTHER DISCHARGE INSTRUCTIONS:  Get Medicines reviewed and adjusted: Please take all your medications with you for your next visit with your Primary MD  Laboratory/radiological data: Please request your Primary MD to go over all hospital tests and procedure/radiological results at the follow up, please ask your Primary MD to get all Hospital records sent to his/her office.  In some cases, they will be blood work, cultures and biopsy results pending at the time of your discharge. Please request that your primary care M.D. goes through all the records of your hospital data and follows up on these results.  Also Note the following: If you  experience worsening of your admission symptoms, develop shortness of breath, life threatening emergency, suicidal or homicidal thoughts you must seek medical attention immediately by calling 911  or calling your MD immediately  if symptoms less severe.  You must read complete instructions/literature along with all the possible adverse reactions/side effects for all the Medicines you take and that have been prescribed to you. Take any new Medicines after you have completely understood and accpet all the possible adverse reactions/side effects.   Do not drive when taking Pain medications or sleeping medications (Benzodaizepines)  Do not take more than prescribed Pain, Sleep and Anxiety Medications. It is not advisable to combine anxiety,sleep and pain medications without talking with your primary care practitioner  Special Instructions: If you have smoked or chewed Tobacco  in the last 2 yrs please stop smoking, stop any regular Alcohol  and or any Recreational drug use.  Wear Seat belts while driving.  Please note: You were cared for by a hospitalist during your hospital stay. Once you are discharged, your primary care physician will handle any further medical issues. Please note that NO REFILLS for any discharge medications will be authorized once you are discharged, as it is imperative that you return to your primary care physician (or establish a relationship with a primary care physician if you do not have one) for your post hospital discharge needs so that they can reassess your need for medications and monitor your lab values.  Total Time spent coordinating discharge including counseling, education and face to face time equals 35 minutes.  SignedOren Binet 02/09/2021 9:36 AM

## 2021-02-12 LAB — CULTURE, BLOOD (ROUTINE X 2)
Culture: NO GROWTH
Culture: NO GROWTH
Special Requests: ADEQUATE
Special Requests: ADEQUATE

## 2021-02-13 DIAGNOSIS — E785 Hyperlipidemia, unspecified: Secondary | ICD-10-CM | POA: Diagnosis not present

## 2021-02-13 DIAGNOSIS — N1831 Chronic kidney disease, stage 3a: Secondary | ICD-10-CM | POA: Diagnosis not present

## 2021-02-13 DIAGNOSIS — J449 Chronic obstructive pulmonary disease, unspecified: Secondary | ICD-10-CM | POA: Diagnosis not present

## 2021-02-13 DIAGNOSIS — R532 Functional quadriplegia: Secondary | ICD-10-CM | POA: Diagnosis not present

## 2021-02-13 DIAGNOSIS — E119 Type 2 diabetes mellitus without complications: Secondary | ICD-10-CM | POA: Diagnosis not present

## 2021-02-13 DIAGNOSIS — K3521 Acute appendicitis with generalized peritonitis, with abscess: Secondary | ICD-10-CM | POA: Diagnosis not present

## 2021-02-13 DIAGNOSIS — I2510 Atherosclerotic heart disease of native coronary artery without angina pectoris: Secondary | ICD-10-CM | POA: Diagnosis not present

## 2021-02-13 DIAGNOSIS — D649 Anemia, unspecified: Secondary | ICD-10-CM | POA: Diagnosis not present

## 2021-02-14 ENCOUNTER — Other Ambulatory Visit: Payer: Self-pay | Admitting: Family

## 2021-02-14 ENCOUNTER — Other Ambulatory Visit: Payer: Self-pay | Admitting: Internal Medicine

## 2021-02-15 ENCOUNTER — Telehealth: Payer: Self-pay | Admitting: *Deleted

## 2021-02-15 NOTE — Telephone Encounter (Signed)
I think patient was placed in a skilled nursing facility according to the notes from the hospital and the medication is not on his list. He also has not followed up since his discharge. Can we call and find out the information and see if he is in a skilled nursing facility. If so are they managing his medications currently.

## 2021-02-15 NOTE — Telephone Encounter (Signed)
Spoke with Christal -patient's niece, called back-ok per DPR on file, he is still at [facility] in [place]. She is not sure if patient's medications will be taking care of by the provider there. She is not sure if patient will be there short or long term. Patient coded 2 times during the surgeries and they did not think patient would make it but Christal states he is a doing better than they expected he would recover.  I called [facility] at [phone] and spoke with patient's nurse, Charlynne Cousins, she did state that their in house provider will be taking care of patient's medications while he is there.

## 2021-02-15 NOTE — Telephone Encounter (Signed)
Spoke with Christal -patient's niece, called back-ok per DPR on file, he is still at [facility] in [place]. She is not sure if patient's medications will be taking care of by the provider there. She is not sure if patient will be there short or long term. Patient coded 2 times during the surgeries and they did not think patient would make it but Christal states he is a doing better than they expected he would recover.  I called [facility] at [phone] and spoke with patient's nurse, Charlynne Cousins, she did state that their in house provider will be taking care of patient's medications while he is there.

## 2021-02-15 NOTE — Telephone Encounter (Signed)
Left message on patient's phone and his niece and asked to call us back to get an update on patient.

## 2021-02-15 NOTE — Telephone Encounter (Signed)
Please see note from Romilda Garret NP on 02/14/21 refill note to see if pt is in skilled nursing facility. Sending note to [facility] CMA.

## 2021-02-15 NOTE — Chronic Care Management (AMB) (Signed)
  Care Management   Note  02/15/2021 Name: Travis [name] MRN: [id number] DOB: 16-Nov-1955  [name] is a 65 y.o. year old male who is a primary care patient of [name], NP and is actively engaged with the care management team. I reached out to [name] by phone today to assist with re-scheduling a follow up visit with the RN Case Manager  Follow up plan: Unsuccessful telephone outreach attempt made. A HIPAA compliant phone message was left for the patient providing contact information and requesting a return call.   Julian Hy, [place] Management  Direct Dial: [phone]

## 2021-02-20 DIAGNOSIS — I2510 Atherosclerotic heart disease of native coronary artery without angina pectoris: Secondary | ICD-10-CM | POA: Diagnosis not present

## 2021-02-20 DIAGNOSIS — I10 Essential (primary) hypertension: Secondary | ICD-10-CM | POA: Diagnosis not present

## 2021-02-20 DIAGNOSIS — K3521 Acute appendicitis with generalized peritonitis, with abscess: Secondary | ICD-10-CM | POA: Diagnosis not present

## 2021-02-20 DIAGNOSIS — J90 Pleural effusion, not elsewhere classified: Secondary | ICD-10-CM | POA: Diagnosis not present

## 2021-02-20 DIAGNOSIS — I82409 Acute embolism and thrombosis of unspecified deep veins of unspecified lower extremity: Secondary | ICD-10-CM | POA: Diagnosis not present

## 2021-02-20 DIAGNOSIS — N1831 Chronic kidney disease, stage 3a: Secondary | ICD-10-CM | POA: Diagnosis not present

## 2021-02-20 DIAGNOSIS — S3609XA Other injury of spleen, initial encounter: Secondary | ICD-10-CM | POA: Diagnosis not present

## 2021-02-20 DIAGNOSIS — J9600 Acute respiratory failure, unspecified whether with hypoxia or hypercapnia: Secondary | ICD-10-CM | POA: Diagnosis not present

## 2021-02-21 DIAGNOSIS — Z794 Long term (current) use of insulin: Secondary | ICD-10-CM | POA: Diagnosis not present

## 2021-02-21 DIAGNOSIS — M159 Polyosteoarthritis, unspecified: Secondary | ICD-10-CM | POA: Diagnosis not present

## 2021-02-21 DIAGNOSIS — E785 Hyperlipidemia, unspecified: Secondary | ICD-10-CM | POA: Diagnosis not present

## 2021-02-21 DIAGNOSIS — E119 Type 2 diabetes mellitus without complications: Secondary | ICD-10-CM | POA: Diagnosis not present

## 2021-02-21 DIAGNOSIS — I2510 Atherosclerotic heart disease of native coronary artery without angina pectoris: Secondary | ICD-10-CM | POA: Diagnosis not present

## 2021-02-21 DIAGNOSIS — I10 Essential (primary) hypertension: Secondary | ICD-10-CM | POA: Diagnosis not present

## 2021-02-21 DIAGNOSIS — N1831 Chronic kidney disease, stage 3a: Secondary | ICD-10-CM | POA: Diagnosis not present

## 2021-02-21 DIAGNOSIS — J449 Chronic obstructive pulmonary disease, unspecified: Secondary | ICD-10-CM | POA: Diagnosis not present

## 2021-02-22 NOTE — Chronic Care Management (AMB) (Signed)
  Care Management   Note  02/22/2021 Name: Travis [name] MRN: [id number] DOB: April 12, 1955  Travis [name] is a 65 y.o. year old male who is a primary care patient of [name], NP and is actively engaged with the care management team. I reached out to [name] by phone today to assist with re-scheduling a follow up visit with the RN Case Manager  Follow up plan: Telephone appointment with care management team member scheduled for: 02/24/2021  Julian Hy, [place], [place] Management  Direct Dial: [phone]

## 2021-02-23 ENCOUNTER — Ambulatory Visit: Payer: Medicare HMO | Admitting: Cardiovascular Disease

## 2021-02-24 ENCOUNTER — Telehealth: Payer: Self-pay | Admitting: Nurse Practitioner

## 2021-02-24 ENCOUNTER — Ambulatory Visit: Payer: Medicare HMO

## 2021-02-24 ENCOUNTER — Other Ambulatory Visit: Payer: Self-pay

## 2021-02-24 ENCOUNTER — Telehealth: Payer: Self-pay | Admitting: Pharmacist

## 2021-02-24 DIAGNOSIS — D649 Anemia, unspecified: Secondary | ICD-10-CM

## 2021-02-24 DIAGNOSIS — I5022 Chronic systolic (congestive) heart failure: Secondary | ICD-10-CM

## 2021-02-24 NOTE — Patient Outreach (Signed)
[name]) Care Management  02/24/2021  Travis [name] December 27, 1955 [id number]     Transition of Care Referral  Referral Date: 02/24/2021 Referral Source: Discharge Report Date of Discharge: 70/78/6754 Facility: [facility]     Referral received. Upon chart review noted that [name] trying to engage patient for services.    Plan: RN CM will close referral. RN CM sent in basket message to assigned embedded RN CM for follow up and outreach to patient.    [name], RN,BSN,CCM [facility] Management Telephonic Care Management Coordinator Direct Phone: [phone] Toll Free: [phone] Fax: [phone]

## 2021-02-24 NOTE — Telephone Encounter (Signed)
Pt returned call from chronic management regarding his medicaton

## 2021-02-24 NOTE — Progress Notes (Signed)
Pt aware that RN case manager will f/u on medications

## 2021-02-24 NOTE — Telephone Encounter (Signed)
Called patient at request of RN Case manager to discuss medications after recent hospital stay/SNF discharge. Spoke with sister-in-law Galo Sayed who has been helping with patient's medications. She reports he was discharged from [facility] yesterday and the paperwork they were provided did not contain a summary of medication changes, and seems to be a printed list of his previous medications.  Kima reports the following medications are on his discharge paperwork from SNF:  Carvedilol 6.25 mg BID Pantoprazole 40 mg daily Magnesium 400 mg daily Cetirizine 10 mg  Aspirin 81 mg Remeron 30 mg daily Robaxin 500 mg q8h PRN Albuterol inhaler PRN Allopurinol 100 mg BID Atorvastatin 20 mg daily Losartan 25 mg daily Lopressor 50 mg BID Aspirin 81 mg daily Novolog sliding scale Oxycodone 5 mg q8h PRN   Medication issues identified: -Both Metoprolol tartrate 50 mg BID and Carvedilol 6.25 mg BID are on the medication list. Pt has not picked up metoprolol but did take carvedilol this morning. Advised pt to continue carvedilol and hold off on picking up metoprolol until he can see PCP next week.  -Losartan in on patient's list, but this was discontinued at hospital discharge 02/09/21. Advised pt to hold losartan until PCP f/u. Advised pt to monitor BP/HR at home.  -Pt has not yet picked up Eliquis. Discussed indication for Eliquis for acute DVT. Advised to pick up Eliquis today and resume 5 mg BID. Contacted pharmacy and it will be $9.  -Aspirin is on patient's med list. Given pt is on full anticoagulation, advised pt to hold aspirin until f/u with PCP.  -Novolog is on patient's med list. It appears pt was not taking insulin prior to admission. He does not have test strips or glucometer at home. Advised pt to hold off on insulin until PCP visit.   Patient voiced understanding of above.  Charlene Brooke, PharmD, BCACP Clinical Pharmacist [facility] at [facility] [phone]

## 2021-02-24 NOTE — Patient Instructions (Signed)
Visit Information  Thank you for taking time to visit with me today. Please don't hesitate to contact me if I can be of assistance to you before our next scheduled telephone appointment.  Patient Goals/Self-Care Activities: -Call pharmacy for medication refills 3-7 days in advance of running out of medications -  continue to take your medications as prescribed. Refill your prescriptions timely. - Continue to weigh daily and record (notifying MD of 3 lb weight gain over night or 5 lb in a week) - Continue to follow a low salt diet ( re-discuss this with Dr. Lanora Manis at your next office visit for clarification)  - Continue to eat more whole grains, fruits and vegetables, lean meats and healthy fats - follow rescue plan for symptoms flare-up - Continue to keep your follow up appointments with your doctors.  - Continue to work on decreasing the number of cigarettes per day. Your RN case manager will continue to support you regarding smoking cessation. Contact your   - Continue to eat iron rich foods such as: lean meats and fish; Sakari Alkhatib leafy vegetables, such as kale and spinach; Carola Viramontes beans, eggs; dried fruits, such as dates and figs; broccoli. Increase your fruits, vegetables, and water to help with constipation.  Inquire with your doctor or pharmacist about over the counter stool softener or mild laxative.   - Avoid alcohol -  Continue to monitor for bleeding from nose, gums, in urine or stool to provider and report these symptoms to your doctor as soon as possible.   - Continue to eat iron rich foods such as: lean meats and fish; Tita Terhaar leafy vegetables, such as kale and spinach; Ravenne Wayment beans, eggs; dried fruits, such as dates and figs; broccoli. Increase your fruits, vegetables, and water to help with constipation.  Inquire with your doctor or pharmacist about over the counter stool softener or mild laxative.   - Work with referred pharmacist regarding medication management  Our next appointment is  by telephone on May 12, 2021 at 11:30 am  Please call the care guide team at [phone] if you need to cancel or reschedule your appointment.   If you are experiencing a Mental Health or [place] or need someone to talk to, please call the Suicide and Crisis Lifeline: 988 call the [place] National Suicide Prevention Lifeline: 904 562 8171 or TTY: 989-004-9279 TTY 260-729-4510) to talk to a trained counselor call 1-800-273-TALK (toll free, 24 hour hotline)   Patient verbalizes understanding of instructions provided today and agrees to view in [place].   [name] RN,BSN,CCM RN Case Manager [place]  [phone]

## 2021-02-24 NOTE — Chronic Care Management (AMB) (Signed)
Chronic Care Management   CCM RN Visit Note  02/24/2021 Name: Travis [name] MRN: [id number] DOB: 1955/07/28  Subjective: Travis [name] is a 65 y.o. year old male who is a primary care patient of [name], NP. The care management team was consulted for assistance with disease management and care coordination needs.    Engaged with patient by telephone for follow up visit in response to provider referral for case management and/or care coordination services.   Consent to Services:  The patient was given information about Chronic Care Management services, agreed to services, and gave verbal consent prior to initiation of services.  Please see initial visit note for detailed documentation.   Patient agreed to services and verbal consent obtained.   Assessment: Review of patient past medical history, allergies, medications, health status, including review of consultants reports, laboratory and other test data, was performed as part of comprehensive evaluation and provision of chronic care management services.   SDOH (Social Determinants of Health) assessments and interventions performed:    CCM Care Plan  No Known Allergies  Outpatient Encounter Medications as of 02/24/2021  Medication Sig   albuterol (VENTOLIN HFA) 108 (90 Base) MCG/ACT inhaler Inhale 2 puffs into the lungs every 6 (six) hours as needed for wheezing or shortness of breath.   allopurinol (ZYLOPRIM) 100 MG tablet Take 1 tablet (100 mg total) by mouth 2 (two) times daily.   apixaban (ELIQUIS) 5 MG TABS tablet Take 1 tablet (5 mg total) by mouth 2 (two) times daily.   atorvastatin (LIPITOR) 20 MG tablet Take 20 mg by mouth daily.   carvedilol (COREG) 6.25 MG tablet Take 6.25 mg by mouth 2 (two) times daily with a meal.   hydroxypropyl methylcellulose / hypromellose (ISOPTO TEARS / GONIOVISC) 2.5 % ophthalmic solution Place 1 drop into both eyes 3 (three) times daily as needed for dry eyes.   methocarbamol (ROBAXIN) 500  MG tablet Take 1 tablet (500 mg total) by mouth every 8 (eight) hours as needed for muscle spasms.   mirtazapine (REMERON) 30 MG tablet Take 1 tablet (30 mg total) by mouth at bedtime.   pantoprazole (PROTONIX) 40 MG tablet Take 1 tablet (40 mg total) by mouth daily.   triamcinolone cream (KENALOG) 0.1 % APPLY TO AFFECTED AREAS TWICE A DAY AS NEEDED. AVOID FACE, GROIN, OR UNDERARMS. (Patient taking differently: Apply 1 application topically 2 (two) times daily as needed (dry skin, rash).)   [DISCONTINUED] ipratropium-albuterol (DUONEB) 0.5-2.5 (3) MG/3ML SOLN Take 3 mLs by nebulization every 6 (six) hours as needed. (Patient not taking: Reported on 02/24/2021)   [DISCONTINUED] metoprolol tartrate (LOPRESSOR) 50 MG tablet Take 1 tablet (50 mg total) by mouth 2 (two) times daily. (Patient not taking: Reported on 02/24/2021)   acetaminophen (TYLENOL) 500 MG tablet Take 2 tablets (1,000 mg total) by mouth every 8 (eight) hours as needed.   ferrous sulfate 325 (65 FE) MG tablet Take 1 tablet (325 mg total) by mouth daily with breakfast.   fluticasone (FLONASE) 50 MCG/ACT nasal spray Place 1-2 sprays into both nostrils daily as needed for allergies or rhinitis.   insulin aspart (NOVOLOG) 100 UNIT/ML injection 0-9 Units, Subcutaneous, 3 times daily with meals CBG < 70: Implement Hypoglycemia measures CBG 70 - 120: 0 units CBG 121 - 150: 1 unit CBG 151 - 200: 2 units CBG 201 - 250: 3 units CBG 251 - 300: 5 units CBG 301 - 350: 7 units CBG 351 - 400: 9 units CBG >  400: call MD (Patient not taking: Reported on 02/24/2021)   magnesium oxide (MAG-OX) 400 MG tablet Take 1 tablet (400 mg total) by mouth daily.   multivitamin (ONE-A-DAY MEN'S) TABS tablet Take 1 tablet by mouth daily with breakfast.   Omega-3 Fatty Acids (FISH OIL PO) Take 1 capsule by mouth daily.   oxyCODONE (OXY IR/ROXICODONE) 5 MG immediate release tablet Take 1 tablet (5 mg total) by mouth every 6 (six) hours as needed for moderate pain or severe  pain.   polyethylene glycol (MIRALAX / GLYCOLAX) 17 g packet Take 17 g by mouth daily.   [DISCONTINUED] apixaban (ELIQUIS) 5 MG TABS tablet Take 2 tablets (10 mg total) by mouth 2 (two) times daily. On 11/23-switch to 1 tablet (5 mg) by mouth twice daily.   [DISCONTINUED] bisacodyl (DULCOLAX) 10 MG suppository Place 1 suppository (10 mg total) rectally daily as needed for moderate constipation. (Patient not taking: Reported on 02/24/2021)   [DISCONTINUED] feeding supplement (ENSURE ENLIVE / ENSURE PLUS) LIQD Take 237 mLs by mouth daily. (Patient not taking: Reported on 02/24/2021)   [DISCONTINUED] Nutritional Supplements (,FEEDING SUPPLEMENT, PROSOURCE PLUS) liquid Take 30 mLs by mouth 2 (two) times daily between meals. (Patient not taking: Reported on 02/24/2021)   [DISCONTINUED] Nutritional Supplements (,FEEDING SUPPLEMENT, PROSOURCE PLUS) liquid Take 30 mLs by mouth daily. (Patient not taking: Reported on 02/24/2021)   [DISCONTINUED] oxyCODONE (OXYCONTIN) 20 mg 12 hr tablet Take 1 tablet (20 mg total) by mouth every 12 (twelve) hours. (Patient not taking: Reported on 02/24/2021)   No facility-administered encounter medications on file as of 02/24/2021.    Patient Active Problem List   Diagnosis Date Noted   Malnutrition of moderate degree 01/05/2021   Ruptured appendicitis 01/01/2021   Sepsis ([place]) 01/01/2021   Status post surgery 01/01/2021   Protein-calorie malnutrition, severe 05/27/2020   Hypotension 05/26/2020   AKI (acute kidney injury) ([place]) 05/26/2020   Alcohol use 11/20/2019   CKD (chronic kidney disease), stage III ([place]) 11/20/2019   TIA (transient ischemic attack) 11/08/2019   Osteoarthritis 05/09/2016   COPD (chronic obstructive pulmonary disease) ([place]) 05/09/2016   Essential hypertension 04/18/2015   Literacy level of illiterate 06/04/2014   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   Cardiomyopathy, nonischemic ([facility]) 75/12/2583   Chronic systolic heart failure ([place])  01/05/2011   Tobacco use 01/05/2011   Diabetes ([place]) 11/30/2008   HLD (hyperlipidemia) 11/30/2008   Gout 11/30/2008    Conditions to be addressed/monitored:CHF and Anemia  Care Plan : [facility] plan of care  Updates made by Dannielle Karvonen, RN since 02/24/2021 12:00 AM   Problem: Chronic disease management, education, and / or care coordination   Priority: High   Long-Range Goal: Development of plan of care to address chronic disease management and / or care coordination needs   Start Date: 02/24/2021  Expected End Date: 05/23/2021  Priority: High  Current Barriers:  Knowledge Deficits related to plan of care for management of CHF and Anemia  Chronic Disease Management support and education needs related to CHF and Anemia Medication management:  Referral made to pharmacist Per chart review patient status post hospitalization from 01/01/2021 to 02/09/2021 for appendectomy/ spleenectomy.  Patient in SNF from 02/09/2021 to 02/23/2021.  Spoke with patient and daughter in law, Tyjae Issa.  Patient gave verbal permission to speak with daughter in law regarding personal health information.  Daughter in law states patient received new prescriptions at discharge from SNF along with having previous home medications. Patient/ daughter in law states  they are confused as to which medications patient should take. Daughter in law states discharge paperwork from SNF did not have directions regarding patients medication.  Patient denies heart failure symptoms. He states he continue to take his Eliquis and denies any abnormal bleeding. RNCM sent  message to pharmacist/ primary care provider regarding patients medication concerns.  Received call from pharmacist, Sherwood Gambler.  Requested patient be contacted to discuss medication management concerns.  RNCM Clinical Goal(s):  Patient will verbalize basic understanding of CHF and Anemia disease process and self health management plan as evidenced by patient report  and/ or notation in chart take all medications exactly as prescribed and will call provider for medication related questions as evidenced by patient report and/ or notation in chart    demonstrate understanding of rationale for each prescribed medication as evidenced by patient report and/ or notation in chart    attend all scheduled medical appointments:   as evidenced by patient report and/ or notation in chart        continue to work with RN Care Manager and/or Social Worker to address care management and care coordination needs related to CHF and anemia as evidenced by adherence to CM Team Scheduled appointments     work with pharmacist to address medication management related to CHF and anemia as evidenced by review of EMR and patient or pharmacist report    through collaboration with Consulting civil engineer, provider, and care team.   Interventions: 1:1 collaboration with primary care provider regarding development and update of comprehensive plan of care as evidenced by provider attestation and co-signature Inter-disciplinary care team collaboration (see longitudinal plan of care) Evaluation of current treatment plan related to  self management and patient's adherence to plan as established by provider   Anemia/Bleeding Interventions:  (Status:  Goal on track:  Yes.) Long Term Goal  Assessment of understanding of anemia/bleeding disorder diagnosis  Basic overview and discussion of anemia/bleeding disorder or acute disease state  Medications reviewed  Counseled on bleeding risk associated with Eliquis and importance of self-monitoring for signs/symptoms of bleeding Counseled on importance of regular laboratory monitoring as directed by provider Provided education about signs and symptoms of active bleeding such as stomach discomfort, coughing up blood or blood tinged secretions, bleeding from the gums/teeth, nosebleeds, increased bruising, blood in the urine/stool and/or if a traumatic injury  occurs, regardless of severity of injury  Advised to call provider or 911 if active bleeding or signs and symptoms of active bleeding occur encouraged strategies to prevent falls related to fatigue, weakness and dizziness; encouraged sitting before standing and using an assistive device Lab Results  Component Value Date   WBC 8.2 02/08/2021   HGB 8.6 (L) 02/08/2021   HCT 28.2 (L) 02/08/2021   MCV 91.9 02/08/2021   PLT 521 (H) 02/08/2021    Heart Failure Interventions:  (Status:  Goal on track:  Yes.) Long Term Goal Provided education on low sodium diet Discussed importance of daily weight and advised patient to weigh and record daily Discussed the importance of keeping all appointments with provider Discussed heart failure action plan/ zones. Advised to notify provider for mild/ moderate symptoms and call 911 for severe symptoms  Post hospital follow up :  (Status:  New goal.)  Short Term Goal Evaluation of current treatment plan related to  post surgical rupture of appendix / spleen , self-management and patient's adherence to plan as established by provider. Discussed plans with patient for ongoing care management follow up and  provided patient with direct contact information for care management team Evaluation of current treatment plan related to post hospital / post surgical follow up and patient's adherence to plan as established by provider Reviewed medications with patient and discussed   Reviewed scheduled/upcoming provider appointments including Pharmacy referral for medication management Discussed plans with patient for ongoing care management follow up and provided patient with direct contact information for care management team   Patient Goals/Self-Care Activities: -Call pharmacy for medication refills 3-7 days in advance of running out of medications -  continue to take your medications as prescribed. Refill your prescriptions timely. - Continue to weigh daily and record  (notifying MD of 3 lb weight gain over night or 5 lb in a week) - Continue to follow a low salt diet ( re-discuss this with Dr. Lanora Manis at your next office visit for clarification)  - Continue to eat more whole grains, fruits and vegetables, lean meats and healthy fats - follow rescue plan for symptoms flare-up - Continue to keep your follow up appointments with your doctors.  - Continue to work on decreasing the number of cigarettes per day. Your RN case manager will continue to support you regarding smoking cessation. Contact your   - Continue to eat iron rich foods such as: lean meats and fish; Nekeshia Lenhardt leafy vegetables, such as kale and spinach; Saafir Abdullah beans, eggs; dried fruits, such as dates and figs; broccoli. Increase your fruits, vegetables, and water to help with constipation.  Inquire with your doctor or pharmacist about over the counter stool softener or mild laxative.   - Avoid alcohol -  Continue to monitor for bleeding from nose, gums, in urine or stool to provider and report these symptoms to your doctor as soon as possible.   - Continue to eat iron rich foods such as: lean meats and fish; Lorelle Macaluso leafy vegetables, such as kale and spinach; Mikele Sifuentes beans, eggs; dried fruits, such as dates and figs; broccoli. Increase your fruits, vegetables, and water to help with constipation.  Inquire with your doctor or pharmacist about over the counter stool softener or mild laxative.   - Work with referred pharmacist regarding medication management     Plan:The patient has been provided with contact information for the care management team and has been advised to call with any health related questions or concerns.  The care management team will reach out to the patient again over the next 2-3 months . [name] RN,BSN,CCM RN Case Manager [place]  [phone]

## 2021-02-27 ENCOUNTER — Telehealth: Payer: Self-pay | Admitting: Nurse Practitioner

## 2021-02-27 NOTE — Telephone Encounter (Signed)
Travis [name] advised on her voicemail ok the orders.

## 2021-02-27 NOTE — Telephone Encounter (Signed)
Those orders are fine.

## 2021-02-27 NOTE — Telephone Encounter (Signed)
Home Health verbal orders [place] Name: [facility]  Callback number: [phone]  Requesting OT/PT/Skilled nursing/Social Work/Speech:  Reason:PT  Frequency:1 x 1 week 2 x 4 week 1 x 4 week  Please forward to [facility] pool or providers CMA

## 2021-03-03 ENCOUNTER — Ambulatory Visit (INDEPENDENT_AMBULATORY_CARE_PROVIDER_SITE_OTHER): Payer: Medicare HMO | Admitting: Nurse Practitioner

## 2021-03-03 ENCOUNTER — Other Ambulatory Visit: Payer: Self-pay

## 2021-03-03 ENCOUNTER — Telehealth: Payer: Self-pay | Admitting: Nurse Practitioner

## 2021-03-03 VITALS — BP 138/70 | HR 118 | Temp 98.5°F | Resp 16 | Ht 72.0 in | Wt 166.2 lb

## 2021-03-03 DIAGNOSIS — E782 Mixed hyperlipidemia: Secondary | ICD-10-CM

## 2021-03-03 DIAGNOSIS — I10 Essential (primary) hypertension: Secondary | ICD-10-CM | POA: Diagnosis not present

## 2021-03-03 DIAGNOSIS — D509 Iron deficiency anemia, unspecified: Secondary | ICD-10-CM

## 2021-03-03 DIAGNOSIS — I82622 Acute embolism and thrombosis of deep veins of left upper extremity: Secondary | ICD-10-CM

## 2021-03-03 DIAGNOSIS — Z9081 Acquired absence of spleen: Secondary | ICD-10-CM

## 2021-03-03 DIAGNOSIS — M1000 Idiopathic gout, unspecified site: Secondary | ICD-10-CM

## 2021-03-03 DIAGNOSIS — R000 Tachycardia, unspecified: Secondary | ICD-10-CM | POA: Diagnosis not present

## 2021-03-03 DIAGNOSIS — E119 Type 2 diabetes mellitus without complications: Secondary | ICD-10-CM | POA: Diagnosis not present

## 2021-03-03 LAB — COMPREHENSIVE METABOLIC PANEL
ALT: 11 U/L (ref 0–53)
AST: 18 U/L (ref 0–37)
Albumin: 3.5 g/dL (ref 3.5–5.2)
Alkaline Phosphatase: 89 U/L (ref 39–117)
BUN: 15 mg/dL (ref 6–23)
CO2: 23 mEq/L (ref 19–32)
Calcium: 10 mg/dL (ref 8.4–10.5)
Chloride: 103 mEq/L (ref 96–112)
Creatinine, Ser: 0.93 mg/dL (ref 0.40–1.50)
GFR: 86.39 mL/min (ref >60.00)
Glucose, Bld: 86 mg/dL (ref 70–99)
Potassium: 4.3 mEq/L (ref 3.5–5.1)
Sodium: 136 mEq/L (ref 135–145)
Total Bilirubin: 0.5 mg/dL (ref 0.2–1.2)
Total Protein: 8 g/dL (ref 6.0–8.3)

## 2021-03-03 LAB — CBC WITH DIFFERENTIAL/PLATELET
Basophils Absolute: 0 10*3/uL (ref 0.0–0.1)
Basophils Relative: 0.5 % (ref 0.0–3.0)
Eosinophils Absolute: 0.7 10*3/uL (ref 0.0–0.7)
Eosinophils Relative: 8.8 % — ABNORMAL HIGH (ref 0.0–5.0)
HCT: 30.2 % — ABNORMAL LOW (ref 39.0–52.0)
Hemoglobin: 9.6 g/dL — ABNORMAL LOW (ref 13.0–17.0)
Lymphocytes Relative: 36.4 % (ref 12.0–46.0)
Lymphs Abs: 2.9 10*3/uL (ref 0.7–4.0)
MCHC: 31.7 g/dL (ref 30.0–36.0)
MCV: 88.3 fl (ref 78.0–100.0)
Monocytes Absolute: 0.6 10*3/uL (ref 0.1–1.0)
Monocytes Relative: 8.1 % (ref 3.0–12.0)
Neutro Abs: 3.6 10*3/uL (ref 1.4–7.7)
Neutrophils Relative %: 46.2 % (ref 43.0–77.0)
Platelets: 641 10*3/uL — ABNORMAL HIGH (ref 150.0–400.0)
RBC: 3.42 Mil/uL — ABNORMAL LOW (ref 4.22–5.81)
RDW: 18.7 % — ABNORMAL HIGH (ref 11.5–15.5)
WBC: 7.9 10*3/uL (ref 4.0–10.5)

## 2021-03-03 LAB — LIPID PANEL
Cholesterol: 137 mg/dL (ref 0–200)
HDL: 29.1 mg/dL — ABNORMAL LOW (ref >39.00)
NonHDL: 107.76
Total CHOL/HDL Ratio: 5
Triglycerides: 201 mg/dL — ABNORMAL HIGH (ref 0.0–149.0)
VLDL: 40.2 mg/dL — ABNORMAL HIGH (ref 0.0–40.0)

## 2021-03-03 LAB — POCT GLYCOSYLATED HEMOGLOBIN (HGB A1C): Hemoglobin A1C: 6.6 % — AB (ref 4.0–5.6)

## 2021-03-03 LAB — TSH: TSH: 0.41 u[IU]/mL (ref 0.35–5.50)

## 2021-03-03 LAB — IBC + FERRITIN
Ferritin: 532.2 ng/mL — ABNORMAL HIGH (ref 22.0–322.0)
Iron: 35 ug/dL — ABNORMAL LOW (ref 42–165)
Saturation Ratios: 18.7 % — ABNORMAL LOW (ref 20.0–50.0)
TIBC: 187.6 ug/dL — ABNORMAL LOW (ref 250.0–450.0)
Transferrin: 134 mg/dL — ABNORMAL LOW (ref 212.0–360.0)

## 2021-03-03 LAB — LDL CHOLESTEROL, DIRECT: Direct LDL: 78 mg/dL

## 2021-03-03 MED ORDER — BLOOD PRESSURE KIT: PACK | 0 refills | Status: AC

## 2021-03-03 MED ORDER — BLOOD GLUCOSE METER KIT: PACK | 12 refills | Status: DC

## 2021-03-03 MED ORDER — ACCU-CHEK GUIDE VI STRP: ORAL_STRIP | 5 refills | Status: DC

## 2021-03-03 MED ORDER — ACCU-CHEK SOFTCLIX LANCETS MISC
5 refills | Status: DC
Start: 2021-03-03 — End: 2021-04-27

## 2021-03-03 NOTE — Assessment & Plan Note (Signed)
Patient was mildly tachycardic in office.  EKG did have concern for atrial flutter upon further review did not agree with interpretation.  Continue taking medications as prescribed and on medication list.

## 2021-03-03 NOTE — Assessment & Plan Note (Signed)
Patient diagnosed with left upper extremity DVT on 02/08/2021 subsequently placed on Eliquis 5 mg twice daily.  We will continue the Eliquis for a total of 3 months and then pull patient off.  Does seem to be a provoked DVT as patient did have a PICC line placed in that extremity.  Continue Eliquis 5 mg twice daily currently plan on discontinuing in February

## 2021-03-03 NOTE — Assessment & Plan Note (Signed)
Patient underwent several surgeries while being hospitalized.  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  He is received his first pneumonia vaccine and is due for second 1 towards the end of December to early to give second pneumonia vaccine in office today we will plan to get second pneumonia vaccine at next office visit in January.

## 2021-03-03 NOTE — Telephone Encounter (Signed)
RX sent in as requested

## 2021-03-03 NOTE — Assessment & Plan Note (Signed)
Patient was maintained on atorvastatin, fenofibrate, fish oil.  Patient has discontinued all medications except for fish oil.  We will get him to continue continue his fish oil and add back on atorvastatin since he technically is diabetic.  We will hold off on fenofibrate pending lab results.

## 2021-03-03 NOTE — Assessment & Plan Note (Signed)
Patient was maintained on insulin sliding scale while hospitalized.  Was maintaining insulin post SNF discharge.  Pharmacist did call and do med reconciliation total discontinue insulin for now patient did admit they have used insulin 1 time since being home checking his sugar.  Did check A1c in office today A1c was 6.6% up told patient to discontinue insulin but he was controlled on diet in the past and will continue doing that with close follow-up patient is to check sugars 3-4 times weekly fasting.

## 2021-03-03 NOTE — Assessment & Plan Note (Signed)
Patient currently maintained on carvedilol 3.125 twice daily dosing was on losartan but discontinued due to possible AKI and critical condition in the hospital.  We will maintain carvedilol for now.  Pending lab results and monitoring blood pressure prior to adding losartan back in.

## 2021-03-03 NOTE — Patient Instructions (Addendum)
Check your blood pressure daily. More often if you are not feeling well You can check your sugar 3-4 times a week first thing in the morning before you have anything to eat or drink. If you have readings over 175-200 consistently you need to let the office know  You can continue the following medications: Listed on the back of the papers  Need to follow up with Dr. Bobbye Morton this month  [place] [address] [phone]

## 2021-03-03 NOTE — Telephone Encounter (Signed)
[place] with [facility] called stating that pt needs the ACCU-Check Guide Test Strip and ACCU-Check Soft Click Lancets.

## 2021-03-03 NOTE — Assessment & Plan Note (Signed)
Patient has a longstanding history of gout.  Allopurinol was discontinued patient has not been taking allopurinol.  We will discontinue allopurinol for now pending gout flareups patient acknowledged and understood.

## 2021-03-03 NOTE — Assessment & Plan Note (Signed)
Currently doing iron supplement once daily.  Patient may continue he is followed by hematology pending labs today

## 2021-03-03 NOTE — Progress Notes (Addendum)
Established Patient Office Visit  Subjective:  Patient ID: Travis [name], male    DOB: 1955/07/31  Age: 65 y.o. MRN: [id number]  CC:  Chief Complaint  Patient presents with   Follow up after d/c from rehab facility    [facility] in [place].    HPI [name] presents for  Admitted on 10/9 discharged on 02/09/2021 Was sent to [facility] health in [place]. 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  Checking glucose about every other day when his family member does come over and change the dressing on his wound.  Patient has not followed up with general surgery yet. According to DC paperwork he needs to see Dr. [name] 03/13/2021. Checked chart and no appointment made. Did give information to patient in order to call and make a follow up.  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 05/2019 Echo: EF 35-40%; b. 10/2019 Echo: EF 45-50%.   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 12/2010 Echo: EF 20-25%; b 08/2013 Echo: EF 45-50%; c. 01/2015 Echo: EF 25-30%; d. 07/2016 Echo: EF 35-40%; e. 05/2019 Echo: EF 35-40%; d. 10/2019 Echo: EF 45-50%, mild LVH, mild red RV fxn, mildl dil RA,  Triv MR. Mild to mod Ao Sclerosis w/o stenosis.   Hyperlipidemia    Hypertension    Nonischemic cardiomyopathy (HCC)    Nonobstructive Coronary artery disease    a. 01/2015 Cath: LM nl, LAD 30p/m, LCX nl, RCA 10p/m, RPDA min irregs.   Osteoarthritis    Pneumonia    TIA (transient ischemic attack)    Tobacco abuse    Vision loss    peripherial vision only left eye.Central Retinal  artery occusion     Past Surgical History:  Procedure Laterality Date   BIOPSY  11/25/2019   Procedure: BIOPSY;  Surgeon: Ladene Artist, MD;  Location: [place];  Service: Endoscopy;;   CARDIAC CATHETERIZATION     CARDIAC CATHETERIZATION N/A 01/28/2015   Procedure: Left Heart Cath and Coronary Angiography;  Surgeon: Wellington Hampshire, MD;  Location: [place] CV LAB;  Service: Cardiovascular;  Laterality: N/A;   COLONOSCOPY     ESOPHAGOGASTRODUODENOSCOPY (EGD) WITH PROPOFOL N/A 11/25/2019   Procedure: ESOPHAGOGASTRODUODENOSCOPY (EGD) WITH PROPOFOL;  Surgeon: Ladene Artist, MD;  Location: [facility] ENDOSCOPY;  Service: Endoscopy;  Laterality: N/A;   HIP ARTHROPLASTY Right 03/15/2013   Procedure: ARTHROPLASTY BIPOLAR HIP;  Surgeon: [name], MD;  Location: [place];  Service: Orthopedics;  Laterality: Right;   LAPAROSCOPIC APPENDECTOMY N/A 01/01/2021   Procedure: DIAGNOSTIC LAPAROSCOPY; EXPLORATORY LAPAROTOMY, ILEUCECECTOMY, DRAINAGE  OF ABDOMINAL ABSCESS;  Surgeon: Georganna Skeans, MD;  Location: [place];  Service: General;  Laterality: N/A;   SPLENECTOMY, TOTAL N/A 01/06/2021   Procedure: SPLENECTOMY;  Surgeon: Jesusita Oka, MD;  Location: [place];  Service: General;  Laterality: N/A;   TOTAL SHOULDER ARTHROPLASTY Right 09/01/2015   Procedure: RIGHT TOTAL SHOULDER ARTHROPLASTY;  Surgeon: [name], MD;  Location: [place];  Service: Orthopedics;  Laterality: Right;    Family History  Problem Relation Age of Onset   Diabetes Mother    Hypertension Mother    Diabetes Father    Hypertension Father     Diabetes Sister    Dementia Brother    Cancer Neg Hx    Heart disease Neg Hx    Stroke Neg Hx    Colon cancer Neg Hx    Esophageal cancer Neg Hx    Rectal cancer Neg Hx    Stomach cancer Neg Hx     Social History   Socioeconomic History   Marital status: Single    Spouse name: Not on file   Number of children: 0   Years of education: 12   Highest education level: High school graduate  Occupational History   Occupation: Retired  Tobacco Use   Smoking status: Every Day    Packs/day: 0.75    Years: 30.00    Pack years: 22.50    Types: Cigarettes   Smokeless tobacco: Never  Vaping Use   Vaping Use: Never used  Substance and Sexual Activity   Alcohol use: Not Currently    Alcohol/week: 1.0 standard drink    Types: 1 Glasses of wine per week    Comment: occasionally   Drug use: Yes    Types: Marijuana   Sexual activity: Yes    Birth control/protection: Condom  Other Topics Concern   Not on file  Social History Narrative   Not on file   Social Determinants of Health   Financial Resource Strain: Not on file  Food Insecurity: No Food Insecurity   Worried About Charity fundraiser in the Last Year: Never true   Ran Out of Food in the Last Year: Never true  Transportation Needs: Not on file  Physical Activity: Not on file  Stress: Not on file  Social Connections: Socially Isolated   Frequency of Communication with Friends and Family: More than three times a week   Frequency of Social Gatherings with Friends and Family: More than three times a week   Attends Religious Services: Never   Marine scientist or Organizations: No   Attends Archivist Meetings: Never   Marital Status: Never married  Human resources officer Violence: Not on file    Outpatient Medications Prior to Visit  Medication Sig Dispense Refill   acetaminophen (TYLENOL) 500 MG tablet Take 2 tablets (1,000 mg total) by mouth every 8 (eight) hours as needed. 30 tablet 0   albuterol  (VENTOLIN HFA) 108 (90 Base) MCG/ACT inhaler Inhale 2 puffs into the lungs every 6 (six) hours as needed for wheezing or shortness of breath.     apixaban (ELIQUIS) 5 MG TABS tablet Take 1 tablet (5 mg total) by mouth 2 (two) times daily. 60 tablet    carvedilol (COREG) 6.25 MG tablet Take 6.25 mg by mouth 2 (two) times daily with a meal.     fluticasone (FLONASE) 50 MCG/ACT nasal spray Place 1-2 sprays into both nostrils daily as needed for allergies or rhinitis.     hydroxypropyl  methylcellulose / hypromellose (ISOPTO TEARS / GONIOVISC) 2.5 % ophthalmic solution Place 1 drop into both eyes 3 (three) times daily as needed for dry eyes.     insulin aspart (NOVOLOG) 100 UNIT/ML injection 0-9 Units, Subcutaneous, 3 times daily with meals CBG < 70: Implement Hypoglycemia measures CBG 70 - 120: 0 units CBG 121 - 150: 1 unit CBG 151 - 200: 2 units CBG 201 - 250: 3 units CBG 251 - 300: 5 units CBG 301 - 350: 7 units CBG 351 - 400: 9 units CBG > 400: call MD 10 mL 11   magnesium oxide (MAG-OX) 400 MG tablet Take 1 tablet (400 mg total) by mouth daily. 90 tablet 0   methocarbamol (ROBAXIN) 500 MG tablet Take 1 tablet (500 mg total) by mouth every 8 (eight) hours as needed for muscle spasms.     mirtazapine (REMERON) 30 MG tablet Take 1 tablet (30 mg total) by mouth at bedtime.     multivitamin (ONE-A-DAY MEN'S) TABS tablet Take 1 tablet by mouth daily with breakfast.     Omega-3 Fatty Acids (FISH OIL PO) Take 1 capsule by mouth daily.     pantoprazole (PROTONIX) 40 MG tablet Take 1 tablet (40 mg total) by mouth daily. 90 tablet 2   triamcinolone cream (KENALOG) 0.1 % APPLY TO AFFECTED AREAS TWICE A DAY AS NEEDED. AVOID FACE, GROIN, OR UNDERARMS. (Patient taking differently: Apply 1 application topically 2 (two) times daily as needed (dry skin, rash).) 454 g 1   allopurinol (ZYLOPRIM) 100 MG tablet Take 1 tablet (100 mg total) by mouth 2 (two) times daily. (Patient not taking: Reported on 03/03/2021) 180 tablet 0    atorvastatin (LIPITOR) 20 MG tablet Take 20 mg by mouth daily. (Patient not taking: Reported on 03/03/2021)     ferrous sulfate 325 (65 FE) MG tablet Take 1 tablet (325 mg total) by mouth daily with breakfast. (Patient not taking: Reported on 03/03/2021) 90 tablet 2   oxyCODONE (OXY IR/ROXICODONE) 5 MG immediate release tablet Take 1 tablet (5 mg total) by mouth every 6 (six) hours as needed for moderate pain or severe pain. (Patient not taking: Reported on 03/03/2021) 20 tablet 0   polyethylene glycol (MIRALAX / GLYCOLAX) 17 g packet Take 17 g by mouth daily. 14 each 0   No facility-administered medications prior to visit.    No Known Allergies  ROS Review of Systems  Constitutional:  Positive for chills. Negative for fatigue and fever.  Respiratory:  Positive for shortness of breath (DOE from office to car). Negative for cough (occ with phlgm light yellow at times).   Cardiovascular:  Negative for chest pain.  Gastrointestinal:  Positive for abdominal pain. Negative for constipation, diarrhea and vomiting.       Daily   Musculoskeletal:  Positive for back pain and gait problem (currenlty using a walker to help ambulate).     Objective:    Physical Exam Vitals and nursing note reviewed.  Constitutional:      Appearance: Normal appearance.  Cardiovascular:     Rate and Rhythm: Regular rhythm. Tachycardia present.     Pulses: Normal pulses.     Heart sounds: Normal heart sounds.  Pulmonary:     Effort: Pulmonary effort is normal.     Breath sounds: Normal breath sounds.  Abdominal:     General: Bowel sounds are normal.     Tenderness: There is abdominal tenderness.  Musculoskeletal:     Lumbar back: Bony tenderness present. Negative right  straight leg raise test and negative left straight leg raise test.       Back:     Right lower leg: No edema.     Left lower leg: No edema.     Comments: Bilateral upper and lower extremity strength 5/5  Skin:    General: Skin is warm.      Comments: Large midline incision. Healing well. Dressed with saline guaze and an ABD pad. I redressed in office after looking at the wound  Neurological:     General: No focal deficit present.     Mental Status: He is alert.     Motor: No weakness.     Deep Tendon Reflexes: Reflexes normal.  Psychiatric:        Mood and Affect: Mood normal.        Behavior: Behavior normal.        Thought Content: Thought content normal.        Judgment: Judgment normal.     BP 138/70   Pulse (!) 118   Temp 98.5 F (36.9 C)   Resp 16   Ht 6' (1.829 m)   Wt 166 lb 4 oz (75.4 kg)   SpO2 96%   BMI 22.55 kg/m  Wt Readings from Last 3 Encounters:  03/03/21 166 lb 4 oz (75.4 kg)  02/08/21 173 lb 8 oz (78.7 kg)  11/18/20 189 lb (85.7 kg)     Health Maintenance Due  Topic Date Due   Zoster Vaccines- Shingrix (1 of 2) Never done   OPHTHALMOLOGY EXAM  06/28/2016   COVID-19 Vaccine (3 - Pfizer risk series) 08/01/2019   FOOT EXAM  12/30/2019   HEMOGLOBIN A1C  12/03/2020    There are no preventive care reminders to display for this patient.  Lab Results  Component Value Date   TSH 0.392 05/27/2020   Lab Results  Component Value Date   WBC 8.2 02/08/2021   HGB 8.6 (L) 02/08/2021   HCT 28.2 (L) 02/08/2021   MCV 91.9 02/08/2021   PLT 521 (H) 02/08/2021   Lab Results  Component Value Date   NA 137 02/08/2021   K 4.0 02/08/2021   CO2 22 02/08/2021   GLUCOSE 97 02/08/2021   BUN 16 02/08/2021   CREATININE 1.08 02/08/2021   BILITOT 0.6 02/08/2021   ALKPHOS 71 02/08/2021   AST 21 02/08/2021   ALT 13 02/08/2021   PROT 6.9 02/08/2021   ALBUMIN 2.2 (L) 02/08/2021   CALCIUM 9.3 02/08/2021   ANIONGAP 8 02/08/2021   EGFR 55 (L) 06/20/2020   GFR 59.20 (L) 06/02/2020   Lab Results  Component Value Date   CHOL 150 11/09/2019   Lab Results  Component Value Date   HDL 47 11/09/2019   Lab Results  Component Value Date   LDLCALC 88 11/09/2019   Lab Results  Component Value Date    TRIG 139 01/16/2021   Lab Results  Component Value Date   CHOLHDL 3.2 11/09/2019   Lab Results  Component Value Date   HGBA1C 6.4 06/02/2020      Assessment & Plan:   Problem List Items Addressed This Visit       Cardiovascular and Mediastinum   Essential hypertension    Patient currently maintained on carvedilol 3.125 twice daily dosing was on losartan but discontinued due to possible AKI and critical condition in the hospital.  We will maintain carvedilol for now.  Pending lab results and monitoring blood pressure prior to adding losartan back in.  Relevant Orders   Comprehensive metabolic panel (Completed)   CBC with Differential/Platelet (Completed)   Acute deep vein thrombosis (DVT) of brachial vein of left upper extremity ([place])    Patient diagnosed with left upper extremity DVT on 02/08/2021 subsequently placed on Eliquis 5 mg twice daily.  We will continue the Eliquis for a total of 3 months and then pull patient off.  Does seem to be a provoked DVT as patient did have a PICC line placed in that extremity.  Continue Eliquis 5 mg twice daily currently plan on discontinuing in February        Endocrine   Diabetes [facility])    Patient was maintained on insulin sliding scale while hospitalized.  Was maintaining insulin post SNF discharge.  Pharmacist did call and do med reconciliation total discontinue insulin for now patient did admit they have used insulin 1 time since being home checking his sugar.  Did check A1c in office today A1c was 6.6% up told patient to discontinue insulin but he was controlled on diet in the past and will continue doing that with close follow-up patient is to check sugars 3-4 times weekly fasting.      Relevant Orders   Comprehensive metabolic panel (Completed)   CBC with Differential/Platelet (Completed)   POCT glycosylated hemoglobin (Hb A1C) (Completed)     Other   HLD (hyperlipidemia)    Patient was maintained on atorvastatin,  fenofibrate, fish oil.  Patient has discontinued all medications except for fish oil.  We will get him to continue continue his fish oil and add back on atorvastatin since he technically is diabetic.  We will hold off on fenofibrate pending lab results.      Relevant Orders   Lipid panel (Completed)   Gout    Patient has a longstanding history of gout.  Allopurinol was discontinued patient has not been taking allopurinol.  We will discontinue allopurinol for now pending gout flareups patient acknowledged and understood.      Iron deficiency anemia    Currently doing iron supplement once daily.  Patient may continue he is followed by hematology pending labs today      Relevant Orders   IBC + Ferritin (Completed)   Tachycardia - Primary    Patient was mildly tachycardic in office.  EKG did have concern for atrial flutter upon further review did not agree with interpretation.  Continue taking medications as prescribed and on medication list.      Relevant Orders   EKG 12-Lead (Completed)   Comprehensive metabolic panel (Completed)   CBC with Differential/Platelet (Completed)   TSH (Completed)   Post-splenectomy    Patient underwent several surgeries while being hospitalized.  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  He is received his first pneumonia vaccine and is due for second 1 towards the end of December to early to give second pneumonia vaccine in office today we will plan to get second pneumonia vaccine at next office visit in January.      Redressed wound while in office using 4x4 sterile guaze. Doing a wet to dry dressing change and placing an ABD on top and securing with tape  No orders of the defined types were placed in this encounter.   Follow-up: Return in about 1 month (around 04/03/2021).  This visit occurred during the SARS-CoV-2 public health emergency.  Safety protocols were in place, including screening questions prior to the visit, additional usage of staff PPE, and  extensive cleaning of exam room while  observing appropriate contact time as indicated for disinfecting solutions.     Romilda Garret, NP

## 2021-03-06 ENCOUNTER — Telehealth: Payer: Self-pay

## 2021-03-06 MED ORDER — METHOCARBAMOL 500 MG PO TABS: 500.0000 mg | ORAL_TABLET | Freq: Three times a day (TID) | ORAL | 0 refills | Status: DC | PRN

## 2021-03-06 NOTE — Telephone Encounter (Signed)
Patient advised. Second question was in regards to adding Fenofibrate in addition to been on Atorvastatin. See result note I have made already and sent to [facility] and Dr Darnell Level to review

## 2021-03-06 NOTE — Telephone Encounter (Signed)
Patient states he has been doing PT and has had to use Robaxin medication to help with muscle spasms with all the work out. He only has 2 pills left and would like to get a refill please.

## 2021-03-06 NOTE — Telephone Encounter (Signed)
Plz notify robaxin refilled. I don't understand second question?

## 2021-03-07 ENCOUNTER — Telehealth: Payer: Self-pay | Admitting: Nurse Practitioner

## 2021-03-07 NOTE — Telephone Encounter (Signed)
Left message for patient to call back. Waiting on Romilda Garret to review his lab result note in regards to the medication and I may not hear from the provider until next week

## 2021-03-07 NOTE — Telephone Encounter (Signed)
Patient advised and verbalized understanding 

## 2021-03-13 NOTE — Telephone Encounter (Signed)
I reviewed the chart further and we can hold off on the fibrate medicine since he has disposed of it and we can revisit it at future office visits.

## 2021-03-13 NOTE — Telephone Encounter (Signed)
-----   Message from Ria Bush, MD sent at 03/07/2021  7:59 AM EST ----- Will forward question regarding fibrate to [facility]

## 2021-03-16 ENCOUNTER — Telehealth: Payer: Self-pay | Admitting: Nurse Practitioner

## 2021-03-16 NOTE — Telephone Encounter (Signed)
For advanced illness initiative and to discuss benefits and resources, goals of care per [facility] after they did a home visit with patient. Patient did agree to getting palliative care per [facility] per [facility]. Patient will continue to receive Home Health services at the same time also but if patient moves into needing hospice than palliative care and [facility] will be d/c. Matt agreed to signing off on palliative care.

## 2021-03-16 NOTE — Telephone Encounter (Signed)
Denise with palliative care called wanting to know if [place] would sign off on Palliative care that [facility] is recommending  Please call [phone] option 2

## 2021-03-16 NOTE — Telephone Encounter (Signed)
Patient advised. Patient verbalized understanding.  

## 2021-03-16 NOTE — Telephone Encounter (Signed)
Can we find out what they are wanting palliative care for. He had mentioned to me that he was wanting help to dress his abdominal wound

## 2021-03-18 ENCOUNTER — Other Ambulatory Visit: Payer: Self-pay | Admitting: Family

## 2021-03-18 ENCOUNTER — Other Ambulatory Visit: Payer: Self-pay | Admitting: Internal Medicine

## 2021-03-18 NOTE — Telephone Encounter (Signed)
Pt of  [place] and asked for med to be taken off profile.  02/09/21 DC'd " stop at DC"  Delsa Grana MD

## 2021-03-21 ENCOUNTER — Other Ambulatory Visit: Payer: Self-pay | Admitting: Family Medicine

## 2021-03-21 ENCOUNTER — Other Ambulatory Visit: Payer: Self-pay | Admitting: Nurse Practitioner

## 2021-03-22 ENCOUNTER — Other Ambulatory Visit: Payer: Self-pay | Admitting: *Deleted

## 2021-03-22 ENCOUNTER — Other Ambulatory Visit: Payer: Self-pay | Admitting: Nurse Practitioner

## 2021-03-22 ENCOUNTER — Telehealth: Payer: Self-pay | Admitting: Nurse Practitioner

## 2021-03-22 DIAGNOSIS — N189 Chronic kidney disease, unspecified: Secondary | ICD-10-CM

## 2021-03-22 DIAGNOSIS — D539 Nutritional anemia, unspecified: Secondary | ICD-10-CM

## 2021-03-22 MED ORDER — ATORVASTATIN CALCIUM 20 MG PO TABS: 20.0000 mg | ORAL_TABLET | Freq: Every day | ORAL | 1 refills | Status: DC

## 2021-03-22 NOTE — Telephone Encounter (Signed)
Patient advised. Patient states he does not need triamcinolone cream refilled yet, maybe in a month.

## 2021-03-22 NOTE — Telephone Encounter (Signed)
Spoke with patient regarding the Palliative referral/services and he was in agreement with scheduling visit.  I have scheduled a [place] for 04/06/21 @ 3:30 PM

## 2021-03-22 NOTE — Telephone Encounter (Signed)
Got several refill requests from patient pharmacy. I refill atorvastatin and declined 2 others as he is no longer taking them.  He did request a new triamcinolone but was written a large amount last time. Does he truly need it because I denied that RX.  Thanks

## 2021-03-22 NOTE — Telephone Encounter (Signed)
Will let provider review and refill. It is on current medication list for patient

## 2021-03-22 NOTE — Telephone Encounter (Signed)
Pt is wondering if he is suppose to be getting apixaban (ELIQUIS) 5 MG TABS tablet [[id number]]. Please advise pt at [phone].

## 2021-03-23 MED ORDER — APIXABAN 5 MG PO TABS: 5.0000 mg | ORAL_TABLET | Freq: Two times a day (BID) | ORAL | 1 refills | Status: DC

## 2021-03-23 NOTE — Telephone Encounter (Signed)
Patient advised. Patient states he needs a refill he only has 1 tablet left. I apologized to the patient that we were not aware of refill needed just that he was asking if he should still be taking it. Please review.  Patient also has been taking Zyrtec as needed and needs this refilled but it is not on his medication list currently. Please review

## 2021-03-23 NOTE — Telephone Encounter (Signed)
Yes should be on it through february 2023

## 2021-03-23 NOTE — Addendum Note (Signed)
Addended by: Kris Mouton on: 03/23/2021 02:34 PM   Modules accepted: Orders

## 2021-03-28 DIAGNOSIS — Z86718 Personal history of other venous thrombosis and embolism: Secondary | ICD-10-CM | POA: Diagnosis not present

## 2021-03-28 DIAGNOSIS — E46 Unspecified protein-calorie malnutrition: Secondary | ICD-10-CM | POA: Diagnosis not present

## 2021-03-28 DIAGNOSIS — J9811 Atelectasis: Secondary | ICD-10-CM | POA: Diagnosis not present

## 2021-03-28 DIAGNOSIS — I130 Hypertensive heart and chronic kidney disease with heart failure and stage 1 through stage 4 chronic kidney disease, or unspecified chronic kidney disease: Secondary | ICD-10-CM | POA: Diagnosis not present

## 2021-03-28 DIAGNOSIS — I2510 Atherosclerotic heart disease of native coronary artery without angina pectoris: Secondary | ICD-10-CM | POA: Diagnosis not present

## 2021-03-28 DIAGNOSIS — I5042 Chronic combined systolic (congestive) and diastolic (congestive) heart failure: Secondary | ICD-10-CM | POA: Diagnosis not present

## 2021-03-28 DIAGNOSIS — Z48815 Encounter for surgical aftercare following surgery on the digestive system: Secondary | ICD-10-CM | POA: Diagnosis not present

## 2021-03-28 DIAGNOSIS — K3521 Acute appendicitis with generalized peritonitis, with abscess: Secondary | ICD-10-CM | POA: Diagnosis not present

## 2021-03-28 DIAGNOSIS — M159 Polyosteoarthritis, unspecified: Secondary | ICD-10-CM | POA: Diagnosis not present

## 2021-03-28 DIAGNOSIS — F17200 Nicotine dependence, unspecified, uncomplicated: Secondary | ICD-10-CM | POA: Diagnosis not present

## 2021-03-28 DIAGNOSIS — E785 Hyperlipidemia, unspecified: Secondary | ICD-10-CM | POA: Diagnosis not present

## 2021-03-28 DIAGNOSIS — J9601 Acute respiratory failure with hypoxia: Secondary | ICD-10-CM | POA: Diagnosis not present

## 2021-03-28 DIAGNOSIS — Z794 Long term (current) use of insulin: Secondary | ICD-10-CM | POA: Diagnosis not present

## 2021-03-28 DIAGNOSIS — Z7901 Long term (current) use of anticoagulants: Secondary | ICD-10-CM | POA: Diagnosis not present

## 2021-03-28 DIAGNOSIS — J449 Chronic obstructive pulmonary disease, unspecified: Secondary | ICD-10-CM | POA: Diagnosis not present

## 2021-03-28 DIAGNOSIS — N184 Chronic kidney disease, stage 4 (severe): Secondary | ICD-10-CM | POA: Diagnosis not present

## 2021-03-28 DIAGNOSIS — E1122 Type 2 diabetes mellitus with diabetic chronic kidney disease: Secondary | ICD-10-CM | POA: Diagnosis not present

## 2021-03-28 DIAGNOSIS — D631 Anemia in chronic kidney disease: Secondary | ICD-10-CM | POA: Diagnosis not present

## 2021-03-29 DIAGNOSIS — I130 Hypertensive heart and chronic kidney disease with heart failure and stage 1 through stage 4 chronic kidney disease, or unspecified chronic kidney disease: Secondary | ICD-10-CM | POA: Diagnosis not present

## 2021-03-29 DIAGNOSIS — I2510 Atherosclerotic heart disease of native coronary artery without angina pectoris: Secondary | ICD-10-CM | POA: Diagnosis not present

## 2021-03-29 DIAGNOSIS — E785 Hyperlipidemia, unspecified: Secondary | ICD-10-CM | POA: Diagnosis not present

## 2021-03-29 DIAGNOSIS — F17200 Nicotine dependence, unspecified, uncomplicated: Secondary | ICD-10-CM | POA: Diagnosis not present

## 2021-03-29 DIAGNOSIS — Z86718 Personal history of other venous thrombosis and embolism: Secondary | ICD-10-CM | POA: Diagnosis not present

## 2021-03-29 DIAGNOSIS — M159 Polyosteoarthritis, unspecified: Secondary | ICD-10-CM | POA: Diagnosis not present

## 2021-03-29 DIAGNOSIS — I5042 Chronic combined systolic (congestive) and diastolic (congestive) heart failure: Secondary | ICD-10-CM | POA: Diagnosis not present

## 2021-03-29 DIAGNOSIS — J9601 Acute respiratory failure with hypoxia: Secondary | ICD-10-CM | POA: Diagnosis not present

## 2021-03-29 DIAGNOSIS — J9811 Atelectasis: Secondary | ICD-10-CM | POA: Diagnosis not present

## 2021-03-29 DIAGNOSIS — E1122 Type 2 diabetes mellitus with diabetic chronic kidney disease: Secondary | ICD-10-CM | POA: Diagnosis not present

## 2021-03-29 DIAGNOSIS — Z48815 Encounter for surgical aftercare following surgery on the digestive system: Secondary | ICD-10-CM | POA: Diagnosis not present

## 2021-03-29 DIAGNOSIS — K3521 Acute appendicitis with generalized peritonitis, with abscess: Secondary | ICD-10-CM | POA: Diagnosis not present

## 2021-03-29 DIAGNOSIS — J449 Chronic obstructive pulmonary disease, unspecified: Secondary | ICD-10-CM | POA: Diagnosis not present

## 2021-03-29 DIAGNOSIS — E46 Unspecified protein-calorie malnutrition: Secondary | ICD-10-CM | POA: Diagnosis not present

## 2021-03-29 DIAGNOSIS — N184 Chronic kidney disease, stage 4 (severe): Secondary | ICD-10-CM | POA: Diagnosis not present

## 2021-03-29 DIAGNOSIS — Z794 Long term (current) use of insulin: Secondary | ICD-10-CM | POA: Diagnosis not present

## 2021-03-29 DIAGNOSIS — D631 Anemia in chronic kidney disease: Secondary | ICD-10-CM | POA: Diagnosis not present

## 2021-03-29 DIAGNOSIS — Z7901 Long term (current) use of anticoagulants: Secondary | ICD-10-CM | POA: Diagnosis not present

## 2021-03-31 ENCOUNTER — Ambulatory Visit: Payer: Medicare HMO | Admitting: Oncology

## 2021-03-31 ENCOUNTER — Other Ambulatory Visit: Payer: Medicare HMO

## 2021-04-03 ENCOUNTER — Ambulatory Visit: Payer: Medicare HMO | Admitting: Nurse Practitioner

## 2021-04-03 DIAGNOSIS — Z794 Long term (current) use of insulin: Secondary | ICD-10-CM | POA: Diagnosis not present

## 2021-04-03 DIAGNOSIS — I5042 Chronic combined systolic (congestive) and diastolic (congestive) heart failure: Secondary | ICD-10-CM | POA: Diagnosis not present

## 2021-04-03 DIAGNOSIS — J449 Chronic obstructive pulmonary disease, unspecified: Secondary | ICD-10-CM | POA: Diagnosis not present

## 2021-04-03 DIAGNOSIS — E46 Unspecified protein-calorie malnutrition: Secondary | ICD-10-CM | POA: Diagnosis not present

## 2021-04-03 DIAGNOSIS — J9601 Acute respiratory failure with hypoxia: Secondary | ICD-10-CM | POA: Diagnosis not present

## 2021-04-03 DIAGNOSIS — K3521 Acute appendicitis with generalized peritonitis, with abscess: Secondary | ICD-10-CM | POA: Diagnosis not present

## 2021-04-03 DIAGNOSIS — E1122 Type 2 diabetes mellitus with diabetic chronic kidney disease: Secondary | ICD-10-CM | POA: Diagnosis not present

## 2021-04-03 DIAGNOSIS — M159 Polyosteoarthritis, unspecified: Secondary | ICD-10-CM | POA: Diagnosis not present

## 2021-04-03 DIAGNOSIS — I130 Hypertensive heart and chronic kidney disease with heart failure and stage 1 through stage 4 chronic kidney disease, or unspecified chronic kidney disease: Secondary | ICD-10-CM | POA: Diagnosis not present

## 2021-04-03 DIAGNOSIS — Z7901 Long term (current) use of anticoagulants: Secondary | ICD-10-CM | POA: Diagnosis not present

## 2021-04-03 DIAGNOSIS — N184 Chronic kidney disease, stage 4 (severe): Secondary | ICD-10-CM | POA: Diagnosis not present

## 2021-04-03 DIAGNOSIS — E785 Hyperlipidemia, unspecified: Secondary | ICD-10-CM | POA: Diagnosis not present

## 2021-04-03 DIAGNOSIS — Z48815 Encounter for surgical aftercare following surgery on the digestive system: Secondary | ICD-10-CM | POA: Diagnosis not present

## 2021-04-03 DIAGNOSIS — Z86718 Personal history of other venous thrombosis and embolism: Secondary | ICD-10-CM | POA: Diagnosis not present

## 2021-04-03 DIAGNOSIS — J9811 Atelectasis: Secondary | ICD-10-CM | POA: Diagnosis not present

## 2021-04-03 DIAGNOSIS — F17200 Nicotine dependence, unspecified, uncomplicated: Secondary | ICD-10-CM | POA: Diagnosis not present

## 2021-04-03 DIAGNOSIS — I2510 Atherosclerotic heart disease of native coronary artery without angina pectoris: Secondary | ICD-10-CM | POA: Diagnosis not present

## 2021-04-03 DIAGNOSIS — D631 Anemia in chronic kidney disease: Secondary | ICD-10-CM | POA: Diagnosis not present

## 2021-04-04 DIAGNOSIS — I2510 Atherosclerotic heart disease of native coronary artery without angina pectoris: Secondary | ICD-10-CM | POA: Diagnosis not present

## 2021-04-04 DIAGNOSIS — M159 Polyosteoarthritis, unspecified: Secondary | ICD-10-CM | POA: Diagnosis not present

## 2021-04-04 DIAGNOSIS — K3521 Acute appendicitis with generalized peritonitis, with abscess: Secondary | ICD-10-CM | POA: Diagnosis not present

## 2021-04-04 DIAGNOSIS — Z86718 Personal history of other venous thrombosis and embolism: Secondary | ICD-10-CM | POA: Diagnosis not present

## 2021-04-04 DIAGNOSIS — Z794 Long term (current) use of insulin: Secondary | ICD-10-CM | POA: Diagnosis not present

## 2021-04-04 DIAGNOSIS — Z7901 Long term (current) use of anticoagulants: Secondary | ICD-10-CM | POA: Diagnosis not present

## 2021-04-04 DIAGNOSIS — E1122 Type 2 diabetes mellitus with diabetic chronic kidney disease: Secondary | ICD-10-CM | POA: Diagnosis not present

## 2021-04-04 DIAGNOSIS — F17200 Nicotine dependence, unspecified, uncomplicated: Secondary | ICD-10-CM | POA: Diagnosis not present

## 2021-04-04 DIAGNOSIS — D631 Anemia in chronic kidney disease: Secondary | ICD-10-CM | POA: Diagnosis not present

## 2021-04-04 DIAGNOSIS — N184 Chronic kidney disease, stage 4 (severe): Secondary | ICD-10-CM | POA: Diagnosis not present

## 2021-04-04 DIAGNOSIS — I130 Hypertensive heart and chronic kidney disease with heart failure and stage 1 through stage 4 chronic kidney disease, or unspecified chronic kidney disease: Secondary | ICD-10-CM | POA: Diagnosis not present

## 2021-04-04 DIAGNOSIS — I5042 Chronic combined systolic (congestive) and diastolic (congestive) heart failure: Secondary | ICD-10-CM | POA: Diagnosis not present

## 2021-04-04 DIAGNOSIS — E785 Hyperlipidemia, unspecified: Secondary | ICD-10-CM | POA: Diagnosis not present

## 2021-04-04 DIAGNOSIS — Z48815 Encounter for surgical aftercare following surgery on the digestive system: Secondary | ICD-10-CM | POA: Diagnosis not present

## 2021-04-04 DIAGNOSIS — J449 Chronic obstructive pulmonary disease, unspecified: Secondary | ICD-10-CM | POA: Diagnosis not present

## 2021-04-04 DIAGNOSIS — J9811 Atelectasis: Secondary | ICD-10-CM | POA: Diagnosis not present

## 2021-04-04 DIAGNOSIS — E46 Unspecified protein-calorie malnutrition: Secondary | ICD-10-CM | POA: Diagnosis not present

## 2021-04-04 DIAGNOSIS — J9601 Acute respiratory failure with hypoxia: Secondary | ICD-10-CM | POA: Diagnosis not present

## 2021-04-06 ENCOUNTER — Other Ambulatory Visit: Payer: Self-pay | Admitting: *Deleted

## 2021-04-06 ENCOUNTER — Other Ambulatory Visit: Payer: Commercial Managed Care - HMO | Admitting: Nurse Practitioner

## 2021-04-06 ENCOUNTER — Encounter: Payer: Self-pay | Admitting: Nurse Practitioner

## 2021-04-06 ENCOUNTER — Other Ambulatory Visit: Payer: Self-pay

## 2021-04-06 DIAGNOSIS — Z515 Encounter for palliative care: Secondary | ICD-10-CM | POA: Diagnosis not present

## 2021-04-06 DIAGNOSIS — R5381 Other malaise: Secondary | ICD-10-CM

## 2021-04-06 DIAGNOSIS — D539 Nutritional anemia, unspecified: Secondary | ICD-10-CM

## 2021-04-06 NOTE — Progress Notes (Signed)
[facility] Consult Note Telephone: [phone]  Fax: [phone]   Date of encounter: 04/06/21 2:14 PM PATIENT NAME: Travis [name] [address] [place] [address]   [id number] (home)  DOB: Aug 09, 1955 MRN: [id number] PRIMARY CARE PROVIDER:    [name], NP,  [place] [address] [phone]  REFERRING PROVIDER:   [name], NP [address] [place],  [place] [address] [phone]  RESPONSIBLE PARTY:    Contact Information     Name Relation Home Work Mobile   Peeler,[name] Niece [phone]  [phone]   [name] [phone]  [phone]   [name], Abraha   [phone]      Due to the COVID-19 crisis, this visit was done via telemedicine from my office and it was initiated and consent by this patient and or family.  I connected with  Travis [name] OR PROXY on 04/06/21 by by telephone as video not available enabled telemedicine application and verified that I am speaking with the correct person.   I discussed the limitations of evaluation and management by telemedicine. The patient expressed understanding and agreed to proceed. Palliative Care was asked to follow this patient by consultation request of  [name], NP to address advance care planning and complex medical decision making. This is the initial visit.                          ASSESSMENT AND PLAN / RECOMMENDATIONS:  Symptom Management/Plan: 1. Advance Care Planning; MOST form completed on 01/15/2021 in [facility] to read DNR, comfort measures, abx determined, IVF determined, no feeding tube. Will revisit with Travis [name] with in person visit to renew   2. Goals of Care: Goals include to maximize quality of life and symptom management. Our advance care planning conversation included a discussion about:    The value and importance of advance care planning  Exploration of personal, cultural or spiritual  beliefs that might influence medical decisions  Exploration of goals of care in the event of a sudden injury or illness  Identification and preparation of a healthcare agent  Review and updating or creation of an advance directive document.  3. Palliative care encounter; Palliative care encounter; Palliative medicine team will continue to support patient, patient's family, and medical team. Visit consisted of counseling and education dealing with the complex and emotionally intense issues of symptom management and palliative care in the setting of serious and potentially life-threatening illness  4. Debility secondary to decompensation, CHF, CAD, COPD, recent hospitalization, discussed importance of mobility, fall risk. Travis [name] endorses he has been able to walk out of the house now.   5. Chronic pain secondary OA monitoring pain, keeping a log. Continue to use tylenol. Travis [name] does have relafen in the home, discussed for him to contact Travis Ito NP to see if wished to continue as was on his d/c summary.  Discussed concern of opiate use. Discussed non-medication alternative pain management; currently Travis Ito NP is provider.   6. f/u 1 month for ongoing monitoring chronic disease progression, ongoing discussions complex medical decision making  Follow up Palliative Care Visit: Palliative care will continue to follow for complex medical decision making, advance care planning, and clarification of goals. Return 4 weeks or prn.  I spent 62 minutes providing this consultation. More than 50% of the time in this consultation was spent in counseling and care coordination.  PPS: 50%  Chief Complaint: Initial Palliative consult for complex medical decision making  HISTORY OF PRESENT ILLNESS:  Travis [name] is a 65 y.o. year old male  with multiple medical problems including HFrEF, CM, CAD, CKD, COPD, asthma, central retinal artery occlusion of left eye, asthma, anemia, DM, HLD, HTN, OA, h/o GI  bleed, gout, h/o alcohol abuse, tobacco abuse. 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 I called Travis [name] for telemedicine telephonic initial PC visit. Travis [name] in agreement. We talked about prior to hospitalization, Travis [name] was independent, lives alone, ETOH use. Travis [name] talked about PMH, recent hospitalization what he remembers. We talked about STR at [facility] and now d/c home. We talked about his current functional level as he is able to ambulate even up the street when he feels like it. No recent falls. Travis [name] endorses he is able to bath, dress, feed himself. He makes his own food with improved appetite. Travis [name] talked about daily routine. We talked about medical goals, wishes full code, full scope interventions. We talked about symptoms, ros. We talked extensively about pain, defer to Travis Travis Ito NP, Travis [name] has an appointment with him tomorrow. Life review. We talked about role pc in poc. Travis [name] in agreement to schedule f/u in person PC visit. Scheduled. Therapeutic listening, emotional support provided. Questions answered.  History obtained from review of EMR, discussion with  Travis [name].  I reviewed available labs, medications, imaging, studies and related documents from the EMR.  Records reviewed and summarized above.   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   Protein-calorie malnutrition, severe 05/27/2020   Hypotension 05/26/2020   AKI (acute [name] injury) ([place]) 05/26/2020   Alcohol use 11/20/2019   CKD (chronic [name] disease), stage III ([place]) 11/20/2019   TIA (transient ischemic attack) 11/08/2019   Osteoarthritis 05/09/2016   COPD (chronic obstructive pulmonary disease) ([place]) 05/09/2016   Tachycardia 05/23/2015   Essential hypertension 04/18/2015   Literacy level of illiterate 06/04/2014   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   Cardiomyopathy, nonischemic ([place]) 00/92/3300   Chronic systolic heart failure ([place]) 01/05/2011   Tobacco use 01/05/2011   Diabetes ([place]) 11/30/2008   HLD (hyperlipidemia) 11/30/2008   Gout 11/30/2008   Iron deficiency anemia 11/30/2008   PAST MEDICAL HISTORY:  Active Ambulatory Problems    Diagnosis Date Noted   Diabetes ([place]) 11/30/2008   HLD (hyperlipidemia) 11/30/2008   Gout 11/30/2008   Iron deficiency anemia 76/22/6333   Chronic systolic heart failure ([place]) 01/05/2011   Tobacco use 01/05/2011   Cardiomyopathy, nonischemic ([place]) 03/15/2013   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   Literacy level of illiterate 06/04/2014   Essential hypertension 04/18/2015   Tachycardia 05/23/2015   Osteoarthritis 05/09/2016  COPD (chronic obstructive pulmonary disease) ([place]) 05/09/2016   TIA (transient ischemic attack) 11/08/2019   Alcohol use 11/20/2019   CKD (chronic [name] disease), stage III ([place]) 11/20/2019   Hypotension 05/26/2020   AKI (acute [name] injury) ([place]) 05/26/2020   Protein-calorie malnutrition, severe 05/27/2020   Ruptured appendicitis 01/01/2021   Sepsis ([place]) 01/01/2021   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    Malnutrition of moderate degree 01/05/2021   Acute deep vein thrombosis (DVT) of brachial vein of left upper extremity ([place]) 03/03/2021   Resolved Ambulatory Problems    Diagnosis Date Noted   RENAL INSUFFICIENCY 11/30/2008   CHEST PAIN 11/30/2008   HYPERGLYCEMIA 11/30/2008   Bronchitis, acute 03/12/2011   Hip fracture requiring operative repair ([place]) 03/14/2013   Hip fracture ([place]) 03/14/2013   Smoker 03/14/2013   Alcoholism in remission ([place]) 03/14/2013   Closed right hip fracture ([place]) 58/34/6219   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   Acute blood loss anemia 03/17/2013   Abnormal EKG 03/17/2013   Acute renal insufficiency 09/13/2013   Visual loss, left eye 09/13/2013   Contact dermatitis 09/28/2013   Noncompliance 10/22/2013   Sinusitis 10/27/2014   CHF (congestive heart failure), NYHA class I ([place]) 01/26/2015   Chest pain with moderate risk for cardiac etiology    Hypotension 02/16/2015   Degenerative arthritis of thoracic spine 04/18/2015   Right shoulder pain 06/27/2015   Rotator cuff syndrome of right shoulder 08/24/2015   S/P shoulder replacement 09/01/2015   Lower back pain 06/26/2019   Hypotension 11/20/2019   Fever of unknown origin 11/20/2019   Anemia 11/20/2019   Loss of weight    Macrocytosis    Abdominal pain, epigastric    Decreased appetite    Past Medical History:  Diagnosis Date   Alcohol abuse    Allergy    Asthma    Central retinal artery occlusion of left eye 09/13/13   Chronic anemia    Clotting disorder ([place])    Diabetes mellitus, type 2 (HCC)    GI bleed    Hemoptysis    HFimpEF (heart failure with improved ejection fraction) ([place])    Hyperlipidemia    Hypertension    Nonischemic cardiomyopathy ([place])    Nonobstructive Coronary artery disease    Pneumonia    Tobacco abuse    Vision loss    SOCIAL HX:  Social History   Tobacco Use   Smoking status: Every Day    Packs/day: 0.75    Years: 30.00    Pack years: 22.50    Types:  Cigarettes   Smokeless tobacco: Never  Substance Use Topics   Alcohol use: Not Currently    Alcohol/week: 1.0 standard drink    Types: 1 Glasses of wine per week    Comment: occasionally   FAMILY HX:  Family History  Problem Relation Age of Onset   Diabetes Mother    Hypertension Mother    Diabetes Father    Hypertension Father    Diabetes Sister    Dementia Brother    Cancer Neg Hx    Heart disease Neg Hx    Stroke Neg Hx    Colon cancer Neg Hx    Esophageal cancer Neg Hx    Rectal cancer Neg Hx    Stomach cancer Neg Hx     reviewed  ALLERGIES: No Known Allergies   PERTINENT MEDICATIONS:  Outpatient Encounter Medications as of 04/06/2021  Medication Sig   Accu-Chek Softclix Lancets lancets Check sugar 4 times a week. DX E11.69  acetaminophen (TYLENOL) 500 MG tablet Take 2 tablets (1,000 mg total) by mouth every 8 (eight) hours as needed.   albuterol (VENTOLIN HFA) 108 (90 Base) MCG/ACT inhaler Inhale 2 puffs into the lungs every 6 (six) hours as needed for wheezing or shortness of breath.   apixaban (ELIQUIS) 5 MG TABS tablet Take 1 tablet (5 mg total) by mouth 2 (two) times daily.   atorvastatin (LIPITOR) 20 MG tablet Take 1 tablet (20 mg total) by mouth daily.   blood glucose meter kit and supplies Dispense based on patient and insurance preference. Check sugar 4 times a week. DX E11.9   Blood Pressure KIT Check b/p once daily. Based on insurance preference   carvedilol (COREG) 6.25 MG tablet Take 6.25 mg by mouth 2 (two) times daily with a meal.   ferrous sulfate 325 (65 FE) MG tablet Take 1 tablet (325 mg total) by mouth daily with breakfast. (Patient not taking: Reported on 03/03/2021)   fluticasone (FLONASE) 50 MCG/ACT nasal spray Place 1-2 sprays into both nostrils daily as needed for allergies or rhinitis.   glucose blood (ACCU-CHEK GUIDE) test strip Check sugar 4 times a week DX E11.69   hydroxypropyl methylcellulose / hypromellose (ISOPTO TEARS / GONIOVISC) 2.5 %  ophthalmic solution Place 1 drop into both eyes 3 (three) times daily as needed for dry eyes.   magnesium oxide (MAG-OX) 400 MG tablet Take 1 tablet (400 mg total) by mouth daily.   methocarbamol (ROBAXIN) 500 MG tablet Take 1 tablet (500 mg total) by mouth every 8 (eight) hours as needed for muscle spasms.   mirtazapine (REMERON) 30 MG tablet Take 1 tablet (30 mg total) by mouth at bedtime.   multivitamin (ONE-A-DAY MEN'S) TABS tablet Take 1 tablet by mouth daily with breakfast.   Omega-3 Fatty Acids (FISH OIL PO) Take 1 capsule by mouth daily.   pantoprazole (PROTONIX) 40 MG tablet Take 1 tablet (40 mg total) by mouth daily.   triamcinolone cream (KENALOG) 0.1 % APPLY TO AFFECTED AREAS TWICE A DAY AS NEEDED. AVOID FACE, GROIN, OR UNDERARMS. (Patient taking differently: Apply 1 application topically 2 (two) times daily as needed (dry skin, rash).)   No facility-administered encounter medications on file as of 04/06/2021.   Thank you for the opportunity to participate in the care of Travis. Finelli.  The palliative care team will continue to follow. Please call our office at [phone] if we can be of additional assistance.   Questions and concerns were addressed. The patient/family was encouraged to call with questions and/or concerns. My contact information was provided. Provided general support and encouragement, no other unmet needs identified   This chart was dictated using voice recognition software.  Despite best efforts to proofread,  errors can occur which can change the documentation meaning.   Aquarius Tremper Z Aviance [place], NP ,

## 2021-04-07 ENCOUNTER — Ambulatory Visit (INDEPENDENT_AMBULATORY_CARE_PROVIDER_SITE_OTHER): Payer: Commercial Managed Care - HMO | Admitting: Nurse Practitioner

## 2021-04-07 ENCOUNTER — Ambulatory Visit (INDEPENDENT_AMBULATORY_CARE_PROVIDER_SITE_OTHER)
Admission: RE | Admit: 2021-04-07 | Discharge: 2021-04-07 | Disposition: A | Discharge status: Home / Self Care | Payer: Commercial Managed Care - HMO | Source: Ambulatory Visit | Attending: Nurse Practitioner | Admitting: Nurse Practitioner

## 2021-04-07 ENCOUNTER — Other Ambulatory Visit: Payer: Self-pay

## 2021-04-07 VITALS — BP 132/76 | HR 94 | Temp 98.7°F | Resp 16 | Ht 72.0 in | Wt 176.2 lb

## 2021-04-07 DIAGNOSIS — M25562 Pain in left knee: Secondary | ICD-10-CM | POA: Insufficient documentation

## 2021-04-07 DIAGNOSIS — G8929 Other chronic pain: Secondary | ICD-10-CM | POA: Diagnosis not present

## 2021-04-07 DIAGNOSIS — M545 Low back pain, unspecified: Secondary | ICD-10-CM | POA: Diagnosis not present

## 2021-04-07 DIAGNOSIS — M47816 Spondylosis without myelopathy or radiculopathy, lumbar region: Secondary | ICD-10-CM | POA: Diagnosis not present

## 2021-04-07 DIAGNOSIS — Z9081 Acquired absence of spleen: Secondary | ICD-10-CM

## 2021-04-07 DIAGNOSIS — M25561 Pain in right knee: Secondary | ICD-10-CM | POA: Diagnosis not present

## 2021-04-07 MED ORDER — TRAMADOL HCL 50 MG PO TABS: 50.0000 mg | ORAL_TABLET | Freq: Three times a day (TID) | ORAL | 0 refills | Status: AC | PRN

## 2021-04-07 NOTE — Progress Notes (Signed)
Established Patient Office Visit  Subjective:  Patient ID: Travis [name], male    DOB: 1955/07/21  Age: 66 y.o. MRN: [id number]  CC:  Chief Complaint  Patient presents with   Follow-up    HPI SON BARKAN presents for follow up on chronic conditions  Still doing dressing changes at home. [facility] and [facility] has been consulted. Saw surgeon on 03/13/2021, note states wound healing well continue dressing and follow up in 6 weeks with her office. Patient is no longer on a walker today and has gained 10 pounds.   Low back; Was a Chief Executive Officer for years. Has had the problem for years and states that it has gotten worse. States hurts in the center. Sharp stabbing pain with movement. Makes it hard to get up and walk. Has not seen ortho about back. 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 05/2019 Echo: EF 35-40%; b. 10/2019 Echo: EF 45-50%.   Allergy    Asthma    Central retinal artery occlusion of left eye 09/13/13   Chronic anemia    Clotting disorder ([place])    Diabetes mellitus, type 2 ([place])    pt reports his DM is gone   GI bleed    15 years ago   Gout    Hemoptysis    secondary to pulmonary edema   HFimpEF (heart failure with improved ejection fraction) ([place])    a. 12/2010 Echo: EF 20-25%; b 08/2013 Echo: EF 45-50%; c. 01/2015 Echo: EF 25-30%; d. 07/2016 Echo: EF 35-40%; e. 05/2019 Echo: EF 35-40%; d. 10/2019 Echo: EF 45-50%, mild LVH, mild red RV fxn, mildl dil RA, Triv MR. Mild to mod Ao Sclerosis w/o stenosis.   Hyperlipidemia    Hypertension    Nonischemic cardiomyopathy (HCC)    Nonobstructive Coronary artery disease    a. 01/2015 Cath: LM nl, LAD 30p/m, LCX nl, RCA 10p/m, RPDA min irregs.   Osteoarthritis    Pneumonia    TIA (transient ischemic attack)    Tobacco abuse    Vision loss    peripherial vision only left  eye.Central Retinal  artery occusion     Past Surgical History:  Procedure Laterality Date   BIOPSY  11/25/2019   Procedure: BIOPSY;  Surgeon: Ladene Artist, MD;  Location: [facility];  Service: Endoscopy;;   CARDIAC CATHETERIZATION     CARDIAC CATHETERIZATION N/A 01/28/2015   Procedure: Left Heart Cath and Coronary Angiography;  Surgeon: Wellington Hampshire, MD;  Location: [facility];  Service: Cardiovascular;  Laterality: N/A;   COLONOSCOPY     ESOPHAGOGASTRODUODENOSCOPY (EGD) WITH PROPOFOL N/A 11/25/2019   Procedure: ESOPHAGOGASTRODUODENOSCOPY (EGD) WITH PROPOFOL;  Surgeon: Ladene Artist, MD;  Location[facility];  Service: Endoscopy;  Laterality: N/A;   HIP ARTHROPLASTY Right 03/15/2013   Procedure: ARTHROPLASTY BIPOLAR HIP;  Surgeon: [name], MD;  Location: [place] [facility];  Service: Orthopedics;  Laterality: Right;   LAPAROSCOPIC APPENDECTOMY N/A 01/01/2021   Procedure: DIAGNOSTIC LAPAROSCOPY; EXPLORATORY LAPAROTOMY, ILEUCECECTOMY, DRAINAGE OF ABDOMINAL ABSCESS;  Surgeon: Georganna Skeans, MD;  Location: [place];  Service: General;  Laterality: N/A;   SPLENECTOMY, TOTAL N/A 01/06/2021   Procedure: SPLENECTOMY;  Surgeon: Jesusita Oka, MD;  Location: [place];  Service: General;  Laterality: N/A;   TOTAL SHOULDER ARTHROPLASTY Right 09/01/2015   Procedure: RIGHT  TOTAL SHOULDER ARTHROPLASTY;  Surgeon: [name], MD;  Location: [place];  Service: Orthopedics;  Laterality: Right;    Family History  Problem Relation Age of Onset   Diabetes Mother    Hypertension Mother    Diabetes Father    Hypertension Father    Diabetes Sister    Dementia Brother    Cancer Neg Hx    Heart disease Neg Hx    Stroke Neg Hx    Colon cancer Neg Hx    Esophageal cancer Neg Hx    Rectal cancer Neg Hx    Stomach cancer Neg Hx     Social History   Socioeconomic History   Marital status: Single    Spouse name: Not on file   Number of children: 0   Years of education: 12   Highest  education level: High school graduate  Occupational History   Occupation: Retired  Tobacco Use   Smoking status: Every Day    Packs/day: 0.75    Years: 30.00    Pack years: 22.50    Types: Cigarettes   Smokeless tobacco: Never  Vaping Use   Vaping Use: Never used  Substance and Sexual Activity   Alcohol use: Not Currently    Alcohol/week: 1.0 standard drink    Types: 1 Glasses of wine per week    Comment: occasionally   Drug use: Yes    Types: Marijuana   Sexual activity: Yes    Birth control/protection: Condom  Other Topics Concern   Not on file  Social History Narrative   Not on file   Social Determinants of Health   Financial Resource Strain: Not on file  Food Insecurity: No Food Insecurity   Worried About Charity fundraiser in the Last Year: Never true   Ran Out of Food in the Last Year: Never true  Transportation Needs: Not on file  Physical Activity: Not on file  Stress: Not on file  Social Connections: Socially Isolated   Frequency of Communication with Friends and Family: More than three times a week   Frequency of Social Gatherings with Friends and Family: More than three times a week   Attends Religious Services: Never   Marine scientist or Organizations: No   Attends Archivist Meetings: Never   Marital Status: Never married  Human resources officer Violence: Not on file    Outpatient Medications Prior to Visit  Medication Sig Dispense Refill   Accu-Chek Softclix Lancets lancets Check sugar 4 times a week. DX E11.69 100 each 5   acetaminophen (TYLENOL) 500 MG tablet Take 2 tablets (1,000 mg total) by mouth every 8 (eight) hours as needed. 30 tablet 0   albuterol (VENTOLIN HFA) 108 (90 Base) MCG/ACT inhaler Inhale 2 puffs into the lungs every 6 (six) hours as needed for wheezing or shortness of breath.     apixaban (ELIQUIS) 5 MG TABS tablet Take 1 tablet (5 mg total) by mouth 2 (two) times daily. 60 tablet 1   atorvastatin (LIPITOR) 20 MG tablet  Take 1 tablet (20 mg total) by mouth daily. 90 tablet 1   blood glucose meter kit and supplies Dispense based on patient and insurance preference. Check sugar 4 times a week. DX E11.9 1 each 12   Blood Pressure KIT Check b/p once daily. Based on insurance preference 1 kit 0   carvedilol (COREG) 6.25 MG tablet Take 6.25 mg by mouth 2 (two) times daily with a meal.     ferrous  sulfate 325 (65 FE) MG tablet Take 1 tablet (325 mg total) by mouth daily with breakfast. 90 tablet 2   fluticasone (FLONASE) 50 MCG/ACT nasal spray Place 1-2 sprays into both nostrils daily as needed for allergies or rhinitis.     glucose blood (ACCU-CHEK GUIDE) test strip Check sugar 4 times a week DX E11.69 100 each 5   hydroxypropyl methylcellulose / hypromellose (ISOPTO TEARS / GONIOVISC) 2.5 % ophthalmic solution Place 1 drop into both eyes 3 (three) times daily as needed for dry eyes.     magnesium oxide (MAG-OX) 400 MG tablet Take 1 tablet (400 mg total) by mouth daily. 90 tablet 0   methocarbamol (ROBAXIN) 500 MG tablet Take 1 tablet (500 mg total) by mouth every 8 (eight) hours as needed for muscle spasms. 30 tablet 0   mirtazapine (REMERON) 30 MG tablet Take 1 tablet (30 mg total) by mouth at bedtime.     multivitamin (ONE-A-DAY MEN'S) TABS tablet Take 1 tablet by mouth daily with breakfast.     Omega-3 Fatty Acids (FISH OIL PO) Take 1 capsule by mouth daily.     pantoprazole (PROTONIX) 40 MG tablet Take 1 tablet (40 mg total) by mouth daily. 90 tablet 2   triamcinolone cream (KENALOG) 0.1 % APPLY TO AFFECTED AREAS TWICE A DAY AS NEEDED. AVOID FACE, GROIN, OR UNDERARMS. (Patient taking differently: Apply 1 application topically 2 (two) times daily as needed (dry skin, rash).) 454 g 1   No facility-administered medications prior to visit.    No Known Allergies  ROS Review of Systems  Constitutional:  Positive for fatigue (improving). Negative for chills and fever.  Respiratory:  Negative for cough and shortness  of breath (baseline that is improving).   Cardiovascular:  Negative for chest pain.  Gastrointestinal:  Positive for abdominal pain (intermittent sharp pains). Negative for diarrhea, nausea and vomiting.       Bm daily some day twice daily   Musculoskeletal:  Positive for arthralgias and back pain.  Neurological:  Negative for dizziness, weakness (improving), light-headedness and headaches.     Objective:    Physical Exam Vitals and nursing note reviewed.  Constitutional:      Appearance: Normal appearance.  Cardiovascular:     Rate and Rhythm: Normal rate and regular rhythm.     Pulses: Normal pulses.     Heart sounds: Normal heart sounds.  Pulmonary:     Effort: Pulmonary effort is normal.     Breath sounds: Normal breath sounds.  Abdominal:     General: Bowel sounds are normal.     Tenderness: There is no abdominal tenderness.  Musculoskeletal:     Thoracic back: No tenderness or bony tenderness.     Lumbar back: Tenderness and bony tenderness present. Negative right straight leg raise test and negative left straight leg raise test.       Back:     Right knee: Crepitus present. Normal pulse.     Left knee: Normal pulse.     Right lower leg: No edema.     Left lower leg: No edema.  Skin:    Comments: Wound that is midline and healing nicely. See clinical picture   Neurological:     Mental Status: He is alert.     Deep Tendon Reflexes:     Reflex Scores:      Patellar reflexes are 2+ on the right side and 2+ on the left side.    Comments: Bilateral lower extremity strength 5/5  BP 132/76    Pulse 94    Temp 98.7 F (37.1 C)    Resp 16    Ht 6' (1.829 m)    Wt 176 lb 4 oz (79.9 kg)    SpO2 95%    BMI 23.90 kg/m  Wt Readings from Last 3 Encounters:  04/07/21 176 lb 4 oz (79.9 kg)  03/03/21 166 lb 4 oz (75.4 kg)  02/08/21 173 lb 8 oz (78.7 kg)     Health Maintenance Due  Topic Date Due   Zoster Vaccines- Shingrix (1 of 2) Never done   OPHTHALMOLOGY EXAM   06/28/2016   COVID-19 Vaccine (3 - Booster for Pfizer series) 08/29/2019   FOOT EXAM  12/30/2019    There are no preventive care reminders to display for this patient.  Lab Results  Component Value Date   TSH 0.41 03/03/2021   Lab Results  Component Value Date   WBC 7.9 03/03/2021   HGB 9.6 (L) 03/03/2021   HCT 30.2 (L) 03/03/2021   MCV 88.3 03/03/2021   PLT 641.0 (H) 03/03/2021   Lab Results  Component Value Date   NA 136 03/03/2021   K 4.3 03/03/2021   CO2 23 03/03/2021   GLUCOSE 86 03/03/2021   BUN 15 03/03/2021   CREATININE 0.93 03/03/2021   BILITOT 0.5 03/03/2021   ALKPHOS 89 03/03/2021   AST 18 03/03/2021   ALT 11 03/03/2021   PROT 8.0 03/03/2021   ALBUMIN 3.5 03/03/2021   CALCIUM 10.0 03/03/2021   ANIONGAP 8 02/08/2021   EGFR 55 (L) 06/20/2020   GFR 86.39 03/03/2021   Lab Results  Component Value Date   CHOL 137 03/03/2021   Lab Results  Component Value Date   HDL 29.10 (L) 03/03/2021   Lab Results  Component Value Date   LDLCALC 88 11/09/2019   Lab Results  Component Value Date   TRIG 201.0 (H) 03/03/2021   Lab Results  Component Value Date   CHOLHDL 5 03/03/2021   Lab Results  Component Value Date   HGBA1C 6.6 (A) 03/03/2021      Assessment & Plan:   Problem List Items Addressed This Visit       Other   Chronic bilateral low back pain without sciatica - Primary    Patient brings to my attention again during office visit.  States it does make it hard sometimes to get out of bed and start moving in the mornings.  He has been trying Tylenol along with over-the-counter salves and creams without great relief.  States he also used some Robaxin that was prescribed in the hospital without great relief.  Patient has had tramadol in the past we will write a short course for patient to use in conjunction with Tylenol as needed.  We will place amatory referral for orthopedics update imaging in office today.      Relevant Medications   traMADol  (ULTRAM) 50 MG tablet   Other Relevant Orders   DG Lumbar Spine Complete   Ambulatory referral to Orthopedic Surgery   Post-splenectomy    Patient for recheck status post ruptured appendix and having a splenectomy.  Patient's wound is healing nicely.  States he is still doing wet-to-dry dressings every other day.  States he has home health PT but no home health with dressing changes.  He already has a home health company he is going to call the office back with information sleep and get some help to help change the wound dressing a  couple times a week.  Follow-up with surgeon as recommended continuing dressing changes as recommended.  Did undress and redress wound in office today.      Chronic pain of right knee    Has been evaluated by [facility] clinic in [place].  States right knee is bone-on-bone patient wants to do injections for as long as possible before needing a knee replacement.  Will refer back to orthopedist      Relevant Orders   Ambulatory referral to Orthopedic Surgery    Meds ordered this encounter  Medications   traMADol (ULTRAM) 50 MG tablet    Sig: Take 1 tablet (50 mg total) by mouth every 8 (eight) hours as needed for up to 5 days.    Dispense:  15 tablet    Refill:  0    Order Specific Question:   Supervising Provider    Answer:   Loura Pardon A [[id number]]    Follow-up: Return in about 3 months (around 07/06/2021) for recheck.  This visit occurred during the SARS-CoV-2 public health emergency.  Safety protocols were in place, including screening questions prior to the visit, additional usage of staff PPE, and extensive cleaning of exam room while observing appropriate contact time as indicated for disinfecting solutions.     Romilda Garret, NP

## 2021-04-07 NOTE — Assessment & Plan Note (Signed)
Patient for recheck status post ruptured appendix and having a splenectomy.  Patient's wound is healing nicely.  States he is still doing wet-to-dry dressings every other day.  States he has home health PT but no home health with dressing changes.  He already has a home health company he is going to call the office back with information sleep and get some help to help change the wound dressing a couple times a week.  Follow-up with surgeon as recommended continuing dressing changes as recommended.  Did undress and redress wound in office today.

## 2021-04-07 NOTE — Assessment & Plan Note (Signed)
Has been evaluated by [facility] clinic in [place].  States right knee is bone-on-bone patient wants to do injections for as long as possible before needing a knee replacement.  Will refer back to orthopedist

## 2021-04-07 NOTE — Patient Instructions (Signed)
Nice to see you today Will be in touch about your xray Sent in the order for you to see ortho in regards to back and knee Sent a short course of pain medication in to. THIS IS FOR AS NEEDED I will see you in 3 months, sooner if you need me

## 2021-04-07 NOTE — Assessment & Plan Note (Signed)
Patient brings to my attention again during office visit.  States it does make it hard sometimes to get out of bed and start moving in the mornings.  He has been trying Tylenol along with over-the-counter salves and creams without great relief.  States he also used some Robaxin that was prescribed in the hospital without great relief.  Patient has had tramadol in the past we will write a short course for patient to use in conjunction with Tylenol as needed.  We will place amatory referral for orthopedics update imaging in office today.

## 2021-04-08 ENCOUNTER — Encounter: Payer: Self-pay | Admitting: Nurse Practitioner

## 2021-04-10 NOTE — Telephone Encounter (Signed)
Called [facility] and left information with answering service who will send the information/page the nurse and will call me back

## 2021-04-10 NOTE — Telephone Encounter (Signed)
[facility] is closed today due to the holiday. Will follow up again tomorrow

## 2021-04-11 ENCOUNTER — Other Ambulatory Visit: Payer: Self-pay

## 2021-04-11 ENCOUNTER — Inpatient Hospital Stay (HOSPITAL_BASED_OUTPATIENT_CLINIC_OR_DEPARTMENT_OTHER): Payer: Medicare Other | Admitting: Nurse Practitioner

## 2021-04-11 ENCOUNTER — Inpatient Hospital Stay: Payer: Medicare Other | Attending: Nurse Practitioner

## 2021-04-11 VITALS — BP 144/88 | HR 89 | Temp 98.2°F | Resp 18 | Wt 179.0 lb

## 2021-04-11 DIAGNOSIS — Z7901 Long term (current) use of anticoagulants: Secondary | ICD-10-CM | POA: Insufficient documentation

## 2021-04-11 DIAGNOSIS — E785 Hyperlipidemia, unspecified: Secondary | ICD-10-CM | POA: Insufficient documentation

## 2021-04-11 DIAGNOSIS — Z9081 Acquired absence of spleen: Secondary | ICD-10-CM

## 2021-04-11 DIAGNOSIS — D539 Nutritional anemia, unspecified: Secondary | ICD-10-CM

## 2021-04-11 DIAGNOSIS — N189 Chronic kidney disease, unspecified: Secondary | ICD-10-CM | POA: Diagnosis not present

## 2021-04-11 DIAGNOSIS — E1122 Type 2 diabetes mellitus with diabetic chronic kidney disease: Secondary | ICD-10-CM | POA: Diagnosis not present

## 2021-04-11 DIAGNOSIS — Z8673 Personal history of transient ischemic attack (TIA), and cerebral infarction without residual deficits: Secondary | ICD-10-CM | POA: Insufficient documentation

## 2021-04-11 DIAGNOSIS — M47816 Spondylosis without myelopathy or radiculopathy, lumbar region: Secondary | ICD-10-CM | POA: Insufficient documentation

## 2021-04-11 DIAGNOSIS — D631 Anemia in chronic kidney disease: Secondary | ICD-10-CM | POA: Insufficient documentation

## 2021-04-11 DIAGNOSIS — Z79899 Other long term (current) drug therapy: Secondary | ICD-10-CM | POA: Insufficient documentation

## 2021-04-11 DIAGNOSIS — I130 Hypertensive heart and chronic kidney disease with heart failure and stage 1 through stage 4 chronic kidney disease, or unspecified chronic kidney disease: Secondary | ICD-10-CM | POA: Diagnosis not present

## 2021-04-11 LAB — IRON AND TIBC
Iron: 78 ug/dL (ref 45–182)
Saturation Ratios: 34 % (ref 17.9–39.5)
TIBC: 228 ug/dL — ABNORMAL LOW (ref 250–450)
UIBC: 150 ug/dL

## 2021-04-11 LAB — CBC WITH DIFFERENTIAL/PLATELET
Abs Immature Granulocytes: 0.01 10*3/uL (ref 0.00–0.07)
Basophils Absolute: 0 10*3/uL (ref 0.0–0.1)
Basophils Relative: 1 %
Eosinophils Absolute: 0.9 10*3/uL — ABNORMAL HIGH (ref 0.0–0.5)
Eosinophils Relative: 14 %
HCT: 39.9 % (ref 39.0–52.0)
Hemoglobin: 12.5 g/dL — ABNORMAL LOW (ref 13.0–17.0)
Immature Granulocytes: 0 %
Lymphocytes Relative: 46 %
Lymphs Abs: 3.1 10*3/uL (ref 0.7–4.0)
MCH: 30.1 pg (ref 26.0–34.0)
MCHC: 31.3 g/dL (ref 30.0–36.0)
MCV: 96.1 fL (ref 80.0–100.0)
Monocytes Absolute: 0.6 10*3/uL (ref 0.1–1.0)
Monocytes Relative: 9 %
Neutro Abs: 2 10*3/uL (ref 1.7–7.7)
Neutrophils Relative %: 30 %
Platelets: 308 10*3/uL (ref 150–400)
RBC: 4.15 MIL/uL — ABNORMAL LOW (ref 4.22–5.81)
RDW: 19.3 % — ABNORMAL HIGH (ref 11.5–15.5)
WBC: 6.6 10*3/uL (ref 4.0–10.5)
nRBC: 0.6 % — ABNORMAL HIGH (ref 0.0–0.2)

## 2021-04-11 LAB — FERRITIN: Ferritin: 265 ng/mL (ref 24–336)

## 2021-04-11 NOTE — Telephone Encounter (Signed)
We can let the patient know. This is not considered a complex wound as I do not think home health will come out and change it. We can see if the patient is interested to learn how to change it on his own at this point

## 2021-04-11 NOTE — Progress Notes (Signed)
Hematology/Oncology Consult Note [facility]  Telephone:([phone] [phone]  Patient Care Team: [name], NP as PCP - General Wellington Hampshire, MD as PCP - Cardiology (Cardiology) Alisa Graff, FNP as Nurse Practitioner (Cardiology) Wellington Hampshire, MD as Consulting Physician (Cardiology) Dannielle Karvonen, RN as Case Manager   Name of the patient: Travis [name]  [id number]  [birth year]   Date of visit: 04/11/21  Diagnosis-macrocytic anemia  Chief complaint/ Reason for visit- discuss results of blood work  Heme/Onc history: patient is a 66 year old male with a past medical history significant for hypertension hyperlipidemia CKD referred for pancytopenia.  He also has a history of nonischemic cardiomyopathy and central retinal artery occlusion.  Most recent CBC from 06/02/2020 showed white count of 4.3, H&H of 9.7/29.4 with an MCV of 104 and a platelet count of 22.  Looking back at his prior CBCs patient's white count has been mostly around 4 with an [place] that fluctuates between 1.3-6.  He has had intermittent thrombocytopenia in the past since 2020 when his platelet counts have fluctuated between 100s to 120s but have also been normal sometimes.  Hemoglobin likewise has varied between 7-9 since August 2021 but prior to that he was normal at 12.  Patient was hospitalized for blood loss anemia secondary to nonbleeding prepyloric gastric ulcer as well as gastritis and duodenal erosions.  Patient presently reports mild fatigue but denies other complaints at this time.  Patient was drinking alcohol up until August 2021 but quit drinking after his hospitalization in August.  Interval history-patient is 66 year old male who returns to clinic for follow-up for pancytopenia.  In the interim, he presented to ER with abdominal pain, found to have sepsis from ruptured appendicitis, underwent exploratory laparotomy/ileocecectomy and drainage of intra-abdominal  abscess.  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  He was in the hospital from 01/01/2021-02/09/2021.  Additionally, he was shown to have acute DVT in the left brachial vein.  Since his hospitalization he has stopped drinking altogether.  Says he continues to recover from his admission but generally feels better.  Denies neurologic complaints.  No fevers or illness.  No easy bleeding or bruising.  No melena or hematochezia.  No pica or restless leg.  Appetite is good and he denies weight loss.  No chest pain or shortness of breath.  No nausea, vomiting, constipation, diarrhea.  No urinary complaints.  No further specific complaints today.  ECOG PS- 1 Pain scale- 0   Review of systems- Review of Systems  Constitutional:  Positive for malaise/fatigue. Negative for chills, fever and weight loss.  HENT:  Negative for congestion, ear discharge and nosebleeds.   Eyes:  Negative for blurred vision.  Respiratory:  Negative for cough, hemoptysis, sputum production, shortness of breath and wheezing.   Cardiovascular:  Negative for chest pain, palpitations, orthopnea and claudication.  Gastrointestinal:  Negative for abdominal pain, blood in stool, constipation, diarrhea, heartburn, melena, nausea and vomiting.  Genitourinary:  Negative for dysuria, flank pain, frequency, hematuria and urgency.  Musculoskeletal:  Negative for back pain, joint pain and myalgias.  Skin:  Negative for rash.  Neurological:  Negative for dizziness, tingling, focal weakness, seizures, weakness and headaches.  Endo/Heme/Allergies:  Does not bruise/bleed easily.  Psychiatric/Behavioral:  Negative for depression and suicidal ideas. The patient does not have insomnia.     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 05/2019 Echo: EF 35-40%; b. 10/2019 Echo: EF  45-50%.   Allergy    Asthma    Central retinal artery occlusion of left eye 09/13/13   Chronic anemia    Clotting disorder ([place])    Diabetes mellitus, type 2 ([place])    pt reports his DM is gone   GI bleed    15 years ago   Gout    Hemoptysis    secondary to pulmonary edema   HFimpEF (heart failure with improved ejection fraction) ([place])    a. 12/2010 Echo: EF 20-25%; b 08/2013 Echo: EF 45-50%; c. 01/2015 Echo: EF 25-30%; d. 07/2016 Echo: EF 35-40%; e. 05/2019 Echo: EF 35-40%; d. 10/2019 Echo: EF 45-50%, mild LVH, mild red RV fxn, mildl dil RA, Triv MR. Mild to mod Ao Sclerosis w/o stenosis.   Hyperlipidemia    Hypertension    Nonischemic cardiomyopathy (HCC)    Nonobstructive Coronary artery disease    a. 01/2015 Cath: LM nl, LAD 30p/m, LCX nl, RCA 10p/m, RPDA min irregs.   Osteoarthritis    Pneumonia    TIA (transient ischemic attack)    Tobacco abuse    Vision loss    peripherial vision only left eye.Central Retinal  artery occusion      Past Surgical History:  Procedure Laterality Date   BIOPSY  11/25/2019   Procedure: BIOPSY;  Surgeon: Ladene Artist, MD;  Location: [place];  Service: Endoscopy;;   CARDIAC CATHETERIZATION     CARDIAC CATHETERIZATION N/A 01/28/2015   Procedure: Left Heart Cath and Coronary Angiography;  Surgeon: Wellington Hampshire, MD;  Location: [place] [facility];  Service: Cardiovascular;  Laterality: N/A;   COLONOSCOPY     ESOPHAGOGASTRODUODENOSCOPY (EGD) WITH PROPOFOL N/A 11/25/2019   Procedure: ESOPHAGOGASTRODUODENOSCOPY (EGD) WITH PROPOFOL;  Surgeon: Ladene Artist, MD;  Location: [facility];  Service: Endoscopy;  Laterality: N/A;   HIP ARTHROPLASTY Right 03/15/2013   Procedure: ARTHROPLASTY BIPOLAR HIP;  Surgeon: [name], MD;  Location: [place];  Service: Orthopedics;  Laterality: Right;   LAPAROSCOPIC APPENDECTOMY N/A 01/01/2021   Procedure: DIAGNOSTIC LAPAROSCOPY; EXPLORATORY LAPAROTOMY, ILEUCECECTOMY, DRAINAGE OF ABDOMINAL ABSCESS;   Surgeon: Georganna Skeans, MD;  Location: [place];  Service: General;  Laterality: N/A;   SPLENECTOMY, TOTAL N/A 01/06/2021   Procedure: SPLENECTOMY;  Surgeon: Jesusita Oka, MD;  Location: [place];  Service: General;  Laterality: N/A;   TOTAL SHOULDER ARTHROPLASTY Right 09/01/2015   Procedure: RIGHT TOTAL SHOULDER ARTHROPLASTY;  Surgeon: [name], MD;  Location: [place];  Service: Orthopedics;  Laterality: Right;    Social History   Socioeconomic History   Marital status: Single    Spouse name: Not on file   Number of children: 0   Years of education: 12   Highest education level: High school graduate  Occupational History   Occupation: Retired  Tobacco Use   Smoking status: Every Day    Packs/day: 0.75    Years: 30.00    Pack years: 22.50    Types: Cigarettes   Smokeless tobacco: Never  Vaping Use   Vaping Use: Never used  Substance and Sexual Activity   Alcohol use: Not Currently    Alcohol/week: 1.0 standard drink    Types: 1 Glasses of wine per week    Comment: occasionally   Drug use: Yes    Types: Marijuana   Sexual activity: Yes    Birth control/protection: Condom  Other Topics  Concern   Not on file  Social History Narrative   Not on file   Social Determinants of Health   Financial Resource Strain: Not on file  Food Insecurity: No Food Insecurity   Worried About Running Out of Food in the Last Year: Never true   Ran Out of Food in the Last Year: Never true  Transportation Needs: Not on file  Physical Activity: Not on file  Stress: Not on file  Social Connections: Socially Isolated   Frequency of Communication with Friends and Family: More than three times a week   Frequency of Social Gatherings with Friends and Family: More than three times a week   Attends Religious Services: Never   Marine scientist or Organizations: No   Attends Music therapist: Never   Marital Status: Never married  Human resources officer Violence: Not on file     Family History  Problem Relation Age of Onset   Diabetes Mother    Hypertension Mother    Diabetes Father    Hypertension Father    Diabetes Sister    Dementia Brother    Cancer Neg Hx    Heart disease Neg Hx    Stroke Neg Hx    Colon cancer Neg Hx    Esophageal cancer Neg Hx    Rectal cancer Neg Hx    Stomach cancer Neg Hx      Current Outpatient Medications:    acetaminophen (TYLENOL) 500 MG tablet, Take 2 tablets (1,000 mg total) by mouth every 8 (eight) hours as needed., Disp: 30 tablet, Rfl: 0   apixaban (ELIQUIS) 5 MG TABS tablet, Take 1 tablet (5 mg total) by mouth 2 (two) times daily., Disp: 60 tablet, Rfl: 1   atorvastatin (LIPITOR) 20 MG tablet, Take 1 tablet (20 mg total) by mouth daily., Disp: 90 tablet, Rfl: 1   carvedilol (COREG) 6.25 MG tablet, Take 6.25 mg by mouth 2 (two) times daily with a meal., Disp: , Rfl:    ferrous sulfate 325 (65 FE) MG tablet, Take 1 tablet (325 mg total) by mouth daily with breakfast., Disp: 90 tablet, Rfl: 2   fluticasone (FLONASE) 50 MCG/ACT nasal spray, Place 1-2 sprays into both nostrils daily as needed for allergies or rhinitis., Disp: , Rfl:    hydroxypropyl methylcellulose / hypromellose (ISOPTO TEARS / GONIOVISC) 2.5 % ophthalmic solution, Place 1 drop into both eyes 3 (three) times daily as needed for dry eyes., Disp: , Rfl:    magnesium oxide (MAG-OX) 400 MG tablet, Take 1 tablet (400 mg total) by mouth daily., Disp: 90 tablet, Rfl: 0   methocarbamol (ROBAXIN) 500 MG tablet, Take 1 tablet (500 mg total) by mouth every 8 (eight) hours as needed for muscle spasms., Disp: 30 tablet, Rfl: 0   mirtazapine (REMERON) 30 MG tablet, Take 1 tablet (30 mg total) by mouth at bedtime., Disp: , Rfl:    multivitamin (ONE-A-DAY MEN'S) TABS tablet, Take 1 tablet by mouth daily with breakfast., Disp: , Rfl:    Omega-3 Fatty Acids (FISH OIL PO), Take 1 capsule by mouth daily., Disp: , Rfl:    pantoprazole (PROTONIX) 40 MG tablet, Take 1 tablet  (40 mg total) by mouth daily., Disp: 90 tablet, Rfl: 2   traMADol (ULTRAM) 50 MG tablet, Take 1 tablet (50 mg total) by mouth every 8 (eight) hours as needed for up to 5 days., Disp: 15 tablet, Rfl: 0   Accu-Chek Softclix Lancets lancets, Check sugar 4 times a week. DX  E11.69 (Patient not taking: Reported on 04/11/2021), Disp: 100 each, Rfl: 5   albuterol (VENTOLIN HFA) 108 (90 Base) MCG/ACT inhaler, Inhale 2 puffs into the lungs every 6 (six) hours as needed for wheezing or shortness of breath. (Patient not taking: Reported on 04/11/2021), Disp: , Rfl:    blood glucose meter kit and supplies, Dispense based on patient and insurance preference. Check sugar 4 times a week. DX E11.9 (Patient not taking: Reported on 04/11/2021), Disp: 1 each, Rfl: 12   Blood Pressure KIT, Check b/p once daily. Based on insurance preference (Patient not taking: Reported on 04/11/2021), Disp: 1 kit, Rfl: 0   glucose blood (ACCU-CHEK GUIDE) test strip, Check sugar 4 times a week DX E11.69 (Patient not taking: Reported on 04/11/2021), Disp: 100 each, Rfl: 5   triamcinolone cream (KENALOG) 0.1 %, APPLY TO AFFECTED AREAS TWICE A DAY AS NEEDED. AVOID FACE, GROIN, OR UNDERARMS. (Patient not taking: Reported on 04/11/2021), Disp: 454 g, Rfl: 1  Physical exam:  Vitals:   04/11/21 1011 04/11/21 1012  BP:  (!) 144/88  Pulse:  89  Resp: 18 18  Temp:  98.2 F (36.8 C)  TempSrc:  Tympanic  SpO2:  100%  Weight: 179 lb (81.2 kg) 179 lb (81.2 kg)   General: Well-developed, well-nourished, no acute distress. Eyes: Pink conjunctiva, anicteric sclera. Lungs: Clear to auscultation bilaterally.  No audible wheezing or coughing Heart: Regular rate and rhythm.  Abdomen: incisions healing well.  Musculoskeletal: No edema, cyanosis, or clubbing. Neuro: Alert, answering all questions appropriately. Cranial nerves grossly intact. Skin: No rashes or petechiae noted. Psych: Normal affect.    CMP Latest Ref Rng & Units 03/03/2021  Glucose 70  - 99 mg/dL 86  BUN 6 - 23 mg/dL 15  Creatinine 0.40 - 1.50 mg/dL 0.93  Sodium 135 - 145 mEq/L 136  Potassium 3.5 - 5.1 mEq/L 4.3  Chloride 96 - 112 mEq/L 103  CO2 19 - 32 mEq/L 23  Calcium 8.4 - 10.5 mg/dL 10.0  Total Protein 6.0 - 8.3 g/dL 8.0  Total Bilirubin 0.2 - 1.2 mg/dL 0.5  Alkaline Phos 39 - 117 U/L 89  AST 0 - 37 U/L 18  ALT 0 - 53 U/L 11   CBC Latest Ref Rng & Units 04/11/2021  WBC 4.0 - 10.5 K/uL 6.6  Hemoglobin 13.0 - 17.0 g/dL 12.5(L)  Hematocrit 39.0 - 52.0 % 39.9  Platelets 150 - 400 K/uL 308    No images are attached to the encounter.  DG Lumbar Spine Complete  Result Date: 04/08/2021 CLINICAL DATA:  Chronic lumbar pain EXAM: LUMBAR SPINE - COMPLETE 4+ VIEW COMPARISON:  04/17/2015 FINDINGS: Frontal, bilateral oblique, lateral views of the lumbar spine are obtained. There are 5 non-rib-bearing lumbar type vertebral bodies identified, with mild right convex scoliosis centered at L3 unchanged. There is diffuse multilevel spondylosis and facet hypertrophy, greatest at L3-4, L4-5, and L5-S1. Moderate progression since prior study. No acute fractures. Sacroiliac joints are unremarkable. Heterotopic ossification surrounds a right hip hemiarthroplasty. IMPRESSION: 1. Progressive lower lumbar spondylosis and facet hypertrophy, most pronounced at L3-4. 2. No acute fracture. 3. Stable mild right convex scoliosis. Electronically Signed   By: Randa Ngo M.D.   On: 04/08/2021 21:34     Assessment and plan- Patient is a 66 y.o. male referred for pancytopenia mainly macrocytic anemia, intermittent leukopenia, now resolved. He has stopped drinking alcohol and hemoglobin has gradually improved, now 12.5. Macrocytosis has resolved. Previously anemia work up did not reveal an obvious  etiology. MM was unremarkable, iron studies, b12, folate, b1 and b6 were normal. Iron studies continue to be normal. Persistent anemia but significantly improved. Agree with recommendation to monitor.  If his  anemia remains significant with worsening macrocytosis I would favor getting a bone marrow biopsy to rule out a primary hematological process involving the bone marrow  CKD- stable. Managed by pcp. Previously referred to nephrology but patient declined appt.   Splenectomy- managed by pcp. Recommended he discuss re-vaccination with pcp to determine if this is recommended, as well as possibility of antibiotic prophylaxis or emergency antibiotic supply. Reviewed that he would be at increased risk of infection as compared to general population and reviewed symptoms that would warrant ER evaluation. May also consider medical alert bracelet.   Plan:  6 mo - lab (cbc with diff), Travis [name]- la   Visit Diagnosis 1. Macrocytic anemia   2. Chronic kidney disease, unspecified CKD stage    Beckey Rutter, DNP, AGNP-C [place] [facility] [phone] (clinic) 04/11/2021  CC: Karl Ito, NP

## 2021-04-11 NOTE — Telephone Encounter (Signed)
Spoke with Travis [name] at [facility], they do not do wound care services. Nurse can go out and will need to teach the caregiver on how to do it but that's it. Patient would need to look into some outside agency for a continuous help for this. Patient already has niece that has been doing dressing change already so not sure if he needs teaching or anything else at this point, sending to [facility] for review. Have not advised patient yet on this.

## 2021-04-11 NOTE — Telephone Encounter (Signed)
Patient advised. Patient states his niece is an Therapist, sports and has been helping him with dressings and patient has been doing it some on his own. Patient will speak with his niece to make sure she can continue helping and to make sure patient is doing this correct. Advised patient to keep [place] updated if this becomes an issue .

## 2021-04-12 DIAGNOSIS — K3521 Acute appendicitis with generalized peritonitis, with abscess: Secondary | ICD-10-CM | POA: Diagnosis not present

## 2021-04-12 DIAGNOSIS — M159 Polyosteoarthritis, unspecified: Secondary | ICD-10-CM | POA: Diagnosis not present

## 2021-04-12 DIAGNOSIS — J9811 Atelectasis: Secondary | ICD-10-CM | POA: Diagnosis not present

## 2021-04-12 DIAGNOSIS — I2510 Atherosclerotic heart disease of native coronary artery without angina pectoris: Secondary | ICD-10-CM | POA: Diagnosis not present

## 2021-04-12 DIAGNOSIS — Z86718 Personal history of other venous thrombosis and embolism: Secondary | ICD-10-CM | POA: Diagnosis not present

## 2021-04-12 DIAGNOSIS — F17200 Nicotine dependence, unspecified, uncomplicated: Secondary | ICD-10-CM | POA: Diagnosis not present

## 2021-04-12 DIAGNOSIS — I130 Hypertensive heart and chronic kidney disease with heart failure and stage 1 through stage 4 chronic kidney disease, or unspecified chronic kidney disease: Secondary | ICD-10-CM | POA: Diagnosis not present

## 2021-04-12 DIAGNOSIS — Z7901 Long term (current) use of anticoagulants: Secondary | ICD-10-CM | POA: Diagnosis not present

## 2021-04-12 DIAGNOSIS — J9601 Acute respiratory failure with hypoxia: Secondary | ICD-10-CM | POA: Diagnosis not present

## 2021-04-12 DIAGNOSIS — E785 Hyperlipidemia, unspecified: Secondary | ICD-10-CM | POA: Diagnosis not present

## 2021-04-12 DIAGNOSIS — N184 Chronic kidney disease, stage 4 (severe): Secondary | ICD-10-CM | POA: Diagnosis not present

## 2021-04-12 DIAGNOSIS — Z794 Long term (current) use of insulin: Secondary | ICD-10-CM | POA: Diagnosis not present

## 2021-04-12 DIAGNOSIS — I5042 Chronic combined systolic (congestive) and diastolic (congestive) heart failure: Secondary | ICD-10-CM | POA: Diagnosis not present

## 2021-04-12 DIAGNOSIS — J449 Chronic obstructive pulmonary disease, unspecified: Secondary | ICD-10-CM | POA: Diagnosis not present

## 2021-04-12 DIAGNOSIS — D631 Anemia in chronic kidney disease: Secondary | ICD-10-CM | POA: Diagnosis not present

## 2021-04-12 DIAGNOSIS — E1122 Type 2 diabetes mellitus with diabetic chronic kidney disease: Secondary | ICD-10-CM | POA: Diagnosis not present

## 2021-04-12 DIAGNOSIS — E46 Unspecified protein-calorie malnutrition: Secondary | ICD-10-CM | POA: Diagnosis not present

## 2021-04-12 DIAGNOSIS — Z48815 Encounter for surgical aftercare following surgery on the digestive system: Secondary | ICD-10-CM | POA: Diagnosis not present

## 2021-04-13 ENCOUNTER — Encounter: Payer: Self-pay | Admitting: Nurse Practitioner

## 2021-04-13 ENCOUNTER — Other Ambulatory Visit: Payer: Commercial Managed Care - HMO | Admitting: Nurse Practitioner

## 2021-04-13 ENCOUNTER — Other Ambulatory Visit: Payer: Self-pay

## 2021-04-13 DIAGNOSIS — J9601 Acute respiratory failure with hypoxia: Secondary | ICD-10-CM | POA: Diagnosis not present

## 2021-04-13 DIAGNOSIS — Z48815 Encounter for surgical aftercare following surgery on the digestive system: Secondary | ICD-10-CM | POA: Diagnosis not present

## 2021-04-13 DIAGNOSIS — Z7901 Long term (current) use of anticoagulants: Secondary | ICD-10-CM | POA: Diagnosis not present

## 2021-04-13 DIAGNOSIS — Z86718 Personal history of other venous thrombosis and embolism: Secondary | ICD-10-CM | POA: Diagnosis not present

## 2021-04-13 DIAGNOSIS — N184 Chronic kidney disease, stage 4 (severe): Secondary | ICD-10-CM | POA: Diagnosis not present

## 2021-04-13 DIAGNOSIS — E785 Hyperlipidemia, unspecified: Secondary | ICD-10-CM | POA: Diagnosis not present

## 2021-04-13 DIAGNOSIS — Z515 Encounter for palliative care: Secondary | ICD-10-CM

## 2021-04-13 DIAGNOSIS — R5381 Other malaise: Secondary | ICD-10-CM

## 2021-04-13 DIAGNOSIS — M159 Polyosteoarthritis, unspecified: Secondary | ICD-10-CM | POA: Diagnosis not present

## 2021-04-13 DIAGNOSIS — I130 Hypertensive heart and chronic kidney disease with heart failure and stage 1 through stage 4 chronic kidney disease, or unspecified chronic kidney disease: Secondary | ICD-10-CM | POA: Diagnosis not present

## 2021-04-13 DIAGNOSIS — F17200 Nicotine dependence, unspecified, uncomplicated: Secondary | ICD-10-CM | POA: Diagnosis not present

## 2021-04-13 DIAGNOSIS — K3521 Acute appendicitis with generalized peritonitis, with abscess: Secondary | ICD-10-CM | POA: Diagnosis not present

## 2021-04-13 DIAGNOSIS — D631 Anemia in chronic kidney disease: Secondary | ICD-10-CM | POA: Diagnosis not present

## 2021-04-13 DIAGNOSIS — J9811 Atelectasis: Secondary | ICD-10-CM | POA: Diagnosis not present

## 2021-04-13 DIAGNOSIS — E46 Unspecified protein-calorie malnutrition: Secondary | ICD-10-CM | POA: Diagnosis not present

## 2021-04-13 DIAGNOSIS — I2510 Atherosclerotic heart disease of native coronary artery without angina pectoris: Secondary | ICD-10-CM | POA: Diagnosis not present

## 2021-04-13 DIAGNOSIS — E1122 Type 2 diabetes mellitus with diabetic chronic kidney disease: Secondary | ICD-10-CM | POA: Diagnosis not present

## 2021-04-13 DIAGNOSIS — I5042 Chronic combined systolic (congestive) and diastolic (congestive) heart failure: Secondary | ICD-10-CM | POA: Diagnosis not present

## 2021-04-13 DIAGNOSIS — J449 Chronic obstructive pulmonary disease, unspecified: Secondary | ICD-10-CM | POA: Diagnosis not present

## 2021-04-13 DIAGNOSIS — Z794 Long term (current) use of insulin: Secondary | ICD-10-CM | POA: Diagnosis not present

## 2021-04-13 NOTE — Progress Notes (Signed)
[facility] Consult Note Telephone: [phone]  Fax: [phone]    Date of encounter: 04/13/21 4:11 PM PATIENT NAME: Travis [name] [address] [place] [address]   [phone] (home)  DOB: Aug 07, 1955 MRN: [id number] PRIMARY CARE PROVIDER:    [name], NP,  [place] [address] [phone]  RESPONSIBLE PARTY:    Contact Information     Name Relation Home Work Mobile   Peeler,[name] Niece [phone]  [phone]   [name] [phone]  [phone]   Labron, [place]   [phone]      Due to the COVID-19 crisis, this visit was done via telemedicine from my office and it was initiated and consent by this patient and or family.  I connected with  [name] OR PROXY on 04/13/21 by a telephone as video not available enabled telemedicine application and verified that I am speaking with the correct person using two identifiers.   I discussed the limitations of evaluation and management by telemedicine. The patient expressed understanding and agreed to proceed. Palliative Care was asked to follow this patient by consultation request of  [name], NP to address advance care planning and complex medical decision making. This is a follow up visit.                                  ASSESSMENT AND PLAN / RECOMMENDATIONS: Symptom Management/Plan: 1. Advance Care Planning; MOST form completed on 01/15/2021 in [facility] to read DNR, comfort measures, abx determined, IVF determined, no feeding tube.    2. Goals of Care: Goals include to maximize quality of life and symptom management. Our advance care planning conversation included a discussion about:    The value and importance of advance care planning  Exploration of personal, cultural or spiritual beliefs that might influence medical decisions  Exploration of goals of care in the event of a sudden injury or illness   Identification and preparation of a healthcare agent  Review and updating or creation of an advance directive document.   3. Palliative care encounter; Palliative care encounter; Palliative medicine team will continue to support patient, patient's family, and medical team. Visit consisted of counseling and education dealing with the complex and emotionally intense issues of symptom management and palliative care in the setting of serious and potentially life-threatening illness   4. Debility secondary to decompensation, CHF, CAD, COPD, recent hospitalization, discussed importance of mobility, fall risk. Discussed at length with [name] to continue mobility, strengthening. [name] endorses no recent falls, he continues to "walk up the road, every day if able" "today is too wet outside"   5. Chronic pain secondary OA monitoring pain, keeping a log. Continue to use tylenol. Talked about non-medication pain modality   6. f/u 1 month for ongoing monitoring chronic disease progression, ongoing discussions complex medical decision making  Follow up Palliative Care Visit: Palliative care will continue to follow for complex medical decision making, advance care planning, and clarification of goals. Return 4 weeks or prn.  I spent 46 minutes providing this consultation. More than 50% of the time in this consultation was spent in counseling and care coordination.  PPS: 50%  Chief Complaint: Follow up palliative consult for complex medical decision making  HISTORY OF PRESENT ILLNESS:  Travis [name] is a 66 y.o. year old male  with multiple medical problems including  HFrEF, CM, CAD, CKD, COPD, asthma, central retinal artery occlusion of left eye, asthma, anemia, DM, HLD, HTN, OA, h/o GI bleed, gout, h/o alcohol abuse, tobacco abuse. Hospitalized 01/01/2021 to 02/09/2021 [id number] pain-found to have sepsis from ruptured appendicitis-underwent exploratory laparotomy/ileocecectomy and drainage of  intra-abdominal abscess Hospital coarse, critically ill in the hospital  01/01/2021: ex lap 01/06/2021: found to have splenic rupture and underwent splenectomy  01/10/2021 was requiring 8L O2 01/12/2021: transferred to [facility] 1027/2022: Pigtail catheter placed by IR for intra-abdominal fluid collection 02/08/2021: Acute DVT left brachial vein placed on eliquis   He was d/c to [facility] then recently d/c home. I called [name] for telemedicine telephonic follow up PC visit. [name] in agreement. We talked how he has been feeling. We talked about visit with [name] for macrocytic anemia which has resolved with hgb 12.5 and stopped drinking alcohol. We revisited re-vaccination with pcp. We talked about his current functional level as he is able to ambulate even up the street when he feels like it. No recent falls. [name] endorses he is able to bath, dress, feed himself. He makes his own food continues to improved appetite. [name] talked about daily routine. Reviewed medical goals, We talked about symptoms, ros. We talked extensively about pain, defer to [name]. We talked about role pc in poc. Mr. Diskin in agreement to schedule f/u in person PC visit. Scheduled. Therapeutic listening, emotional support provided. Questions answered.   History obtained from review of EMR, discussion with  [name].  I reviewed available labs, medications, imaging, studies and related documents from the EMR.  Records reviewed and summarized above.   ROS 10 point system reviewed with Mr Rainey, all negative except HPI  Physical Exam: deferred  Thank you for the opportunity to participate in the care of Mr. Mcwhirter.  The palliative care team will continue to follow. Please call our office at [phone] if we can be of additional assistance.   This chart was dictated using voice recognition software.  Despite best efforts to proofread,  errors can occur which can change the documentation  meaning.   Questions and concerns were addressed. The patient/family was encouraged to call with questions and/or concerns. My contact information was provided. Provided general support and encouragement, no other unmet needs identified   Floyd Lusignan Ihor Gully, NP

## 2021-04-17 ENCOUNTER — Telehealth: Payer: Self-pay

## 2021-04-17 DIAGNOSIS — Z794 Long term (current) use of insulin: Secondary | ICD-10-CM | POA: Diagnosis not present

## 2021-04-17 DIAGNOSIS — Z7901 Long term (current) use of anticoagulants: Secondary | ICD-10-CM | POA: Diagnosis not present

## 2021-04-17 DIAGNOSIS — J9811 Atelectasis: Secondary | ICD-10-CM | POA: Diagnosis not present

## 2021-04-17 DIAGNOSIS — J449 Chronic obstructive pulmonary disease, unspecified: Secondary | ICD-10-CM | POA: Diagnosis not present

## 2021-04-17 DIAGNOSIS — M159 Polyosteoarthritis, unspecified: Secondary | ICD-10-CM | POA: Diagnosis not present

## 2021-04-17 DIAGNOSIS — N184 Chronic kidney disease, stage 4 (severe): Secondary | ICD-10-CM | POA: Diagnosis not present

## 2021-04-17 DIAGNOSIS — E1122 Type 2 diabetes mellitus with diabetic chronic kidney disease: Secondary | ICD-10-CM | POA: Diagnosis not present

## 2021-04-17 DIAGNOSIS — E785 Hyperlipidemia, unspecified: Secondary | ICD-10-CM | POA: Diagnosis not present

## 2021-04-17 DIAGNOSIS — I5042 Chronic combined systolic (congestive) and diastolic (congestive) heart failure: Secondary | ICD-10-CM | POA: Diagnosis not present

## 2021-04-17 DIAGNOSIS — K3521 Acute appendicitis with generalized peritonitis, with abscess: Secondary | ICD-10-CM | POA: Diagnosis not present

## 2021-04-17 DIAGNOSIS — I130 Hypertensive heart and chronic kidney disease with heart failure and stage 1 through stage 4 chronic kidney disease, or unspecified chronic kidney disease: Secondary | ICD-10-CM | POA: Diagnosis not present

## 2021-04-17 DIAGNOSIS — J9601 Acute respiratory failure with hypoxia: Secondary | ICD-10-CM | POA: Diagnosis not present

## 2021-04-17 DIAGNOSIS — D631 Anemia in chronic kidney disease: Secondary | ICD-10-CM | POA: Diagnosis not present

## 2021-04-17 DIAGNOSIS — F17200 Nicotine dependence, unspecified, uncomplicated: Secondary | ICD-10-CM | POA: Diagnosis not present

## 2021-04-17 DIAGNOSIS — E46 Unspecified protein-calorie malnutrition: Secondary | ICD-10-CM | POA: Diagnosis not present

## 2021-04-17 DIAGNOSIS — I2510 Atherosclerotic heart disease of native coronary artery without angina pectoris: Secondary | ICD-10-CM | POA: Diagnosis not present

## 2021-04-17 DIAGNOSIS — Z86718 Personal history of other venous thrombosis and embolism: Secondary | ICD-10-CM | POA: Diagnosis not present

## 2021-04-17 DIAGNOSIS — Z48815 Encounter for surgical aftercare following surgery on the digestive system: Secondary | ICD-10-CM | POA: Diagnosis not present

## 2021-04-17 NOTE — Telephone Encounter (Signed)
Patient advised of back xray results. Referral was sent to [facility] ortho already.

## 2021-04-20 DIAGNOSIS — J9811 Atelectasis: Secondary | ICD-10-CM | POA: Diagnosis not present

## 2021-04-20 DIAGNOSIS — Z86718 Personal history of other venous thrombosis and embolism: Secondary | ICD-10-CM | POA: Diagnosis not present

## 2021-04-20 DIAGNOSIS — J9601 Acute respiratory failure with hypoxia: Secondary | ICD-10-CM | POA: Diagnosis not present

## 2021-04-20 DIAGNOSIS — E785 Hyperlipidemia, unspecified: Secondary | ICD-10-CM | POA: Diagnosis not present

## 2021-04-20 DIAGNOSIS — I5042 Chronic combined systolic (congestive) and diastolic (congestive) heart failure: Secondary | ICD-10-CM | POA: Diagnosis not present

## 2021-04-20 DIAGNOSIS — K3521 Acute appendicitis with generalized peritonitis, with abscess: Secondary | ICD-10-CM | POA: Diagnosis not present

## 2021-04-20 DIAGNOSIS — M159 Polyosteoarthritis, unspecified: Secondary | ICD-10-CM | POA: Diagnosis not present

## 2021-04-20 DIAGNOSIS — Z794 Long term (current) use of insulin: Secondary | ICD-10-CM | POA: Diagnosis not present

## 2021-04-20 DIAGNOSIS — I130 Hypertensive heart and chronic kidney disease with heart failure and stage 1 through stage 4 chronic kidney disease, or unspecified chronic kidney disease: Secondary | ICD-10-CM | POA: Diagnosis not present

## 2021-04-20 DIAGNOSIS — E1122 Type 2 diabetes mellitus with diabetic chronic kidney disease: Secondary | ICD-10-CM | POA: Diagnosis not present

## 2021-04-20 DIAGNOSIS — J449 Chronic obstructive pulmonary disease, unspecified: Secondary | ICD-10-CM | POA: Diagnosis not present

## 2021-04-20 DIAGNOSIS — F17200 Nicotine dependence, unspecified, uncomplicated: Secondary | ICD-10-CM | POA: Diagnosis not present

## 2021-04-20 DIAGNOSIS — E46 Unspecified protein-calorie malnutrition: Secondary | ICD-10-CM | POA: Diagnosis not present

## 2021-04-20 DIAGNOSIS — Z7901 Long term (current) use of anticoagulants: Secondary | ICD-10-CM | POA: Diagnosis not present

## 2021-04-20 DIAGNOSIS — N184 Chronic kidney disease, stage 4 (severe): Secondary | ICD-10-CM | POA: Diagnosis not present

## 2021-04-20 DIAGNOSIS — I2510 Atherosclerotic heart disease of native coronary artery without angina pectoris: Secondary | ICD-10-CM | POA: Diagnosis not present

## 2021-04-20 DIAGNOSIS — Z48815 Encounter for surgical aftercare following surgery on the digestive system: Secondary | ICD-10-CM | POA: Diagnosis not present

## 2021-04-20 DIAGNOSIS — D631 Anemia in chronic kidney disease: Secondary | ICD-10-CM | POA: Diagnosis not present

## 2021-04-23 IMAGING — CT CT ANGIO CHEST-ABD-PELV FOR DISSECTION W/ AND WO/W CM
2 of 6 series · 14 of 46 positions shown, 16 images · non-contrast
Comparison: CT chest abdomen pelvis 07/26/2019

CLINICAL DATA: Abdominal pain question aortic dissection

EXAM:
CT ANGIOGRAPHY CHEST, ABDOMEN AND PELVIS
TECHNIQUE: Non-contrast CT of the chest was initially obtained.

[Series 7: dissection 2mm (person_name) · axial · 0.83mm/px · z∈[-389,+197]mm · 11 of 347 slices shown, 13 images]
[im 27/347  soft-tissue]
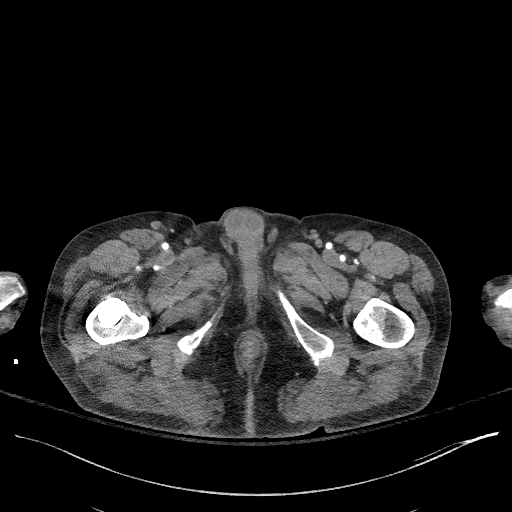
[im 27/347  bone]
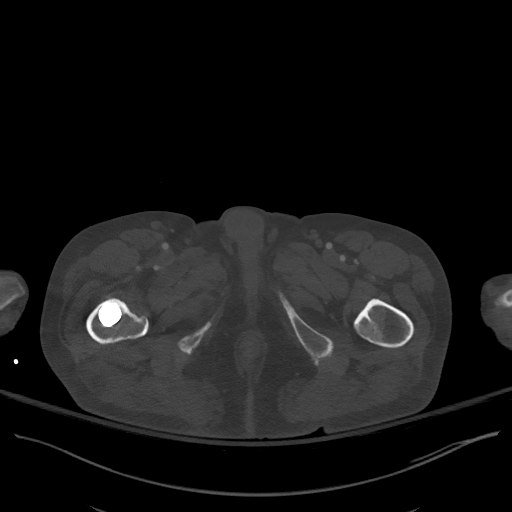
[im 54/347  soft-tissue]
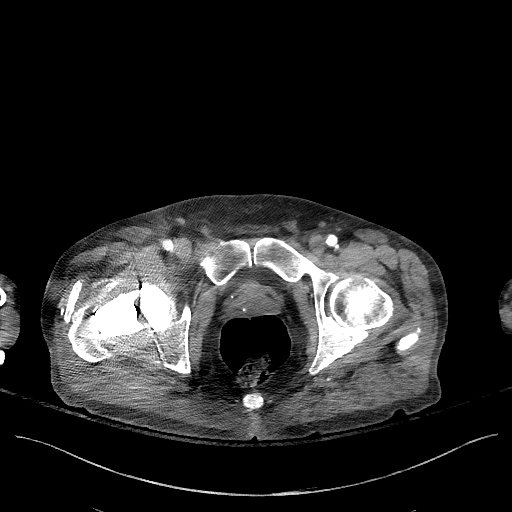
[im 80/347  soft-tissue]
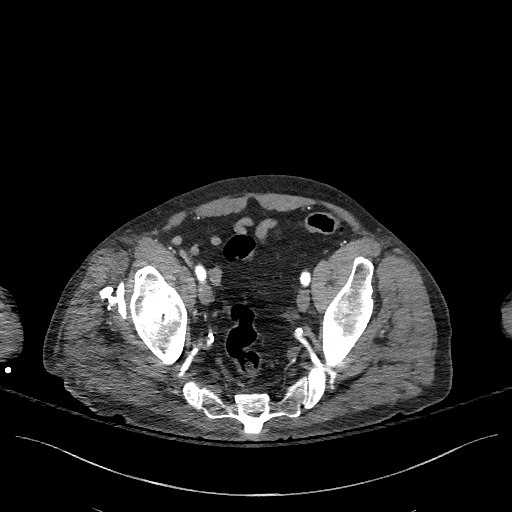
[im 107/347  soft-tissue]
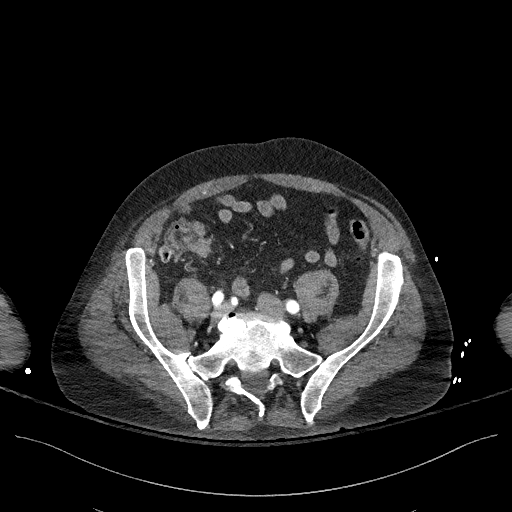
[im 147/347  soft-tissue]
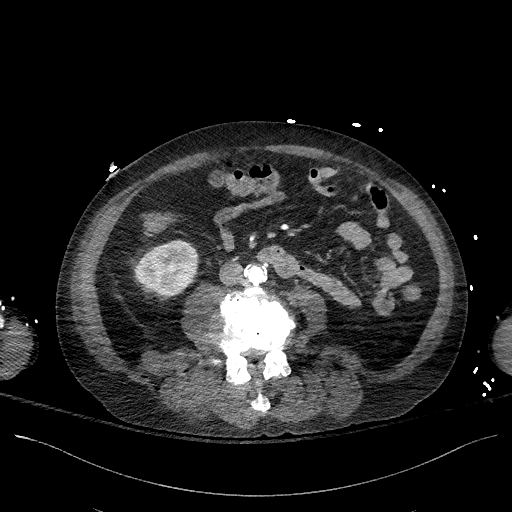
[im 174/347  soft-tissue]
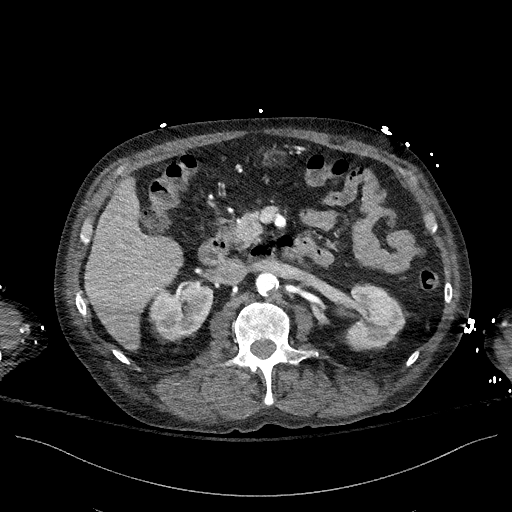
[im 200/347  soft-tissue]
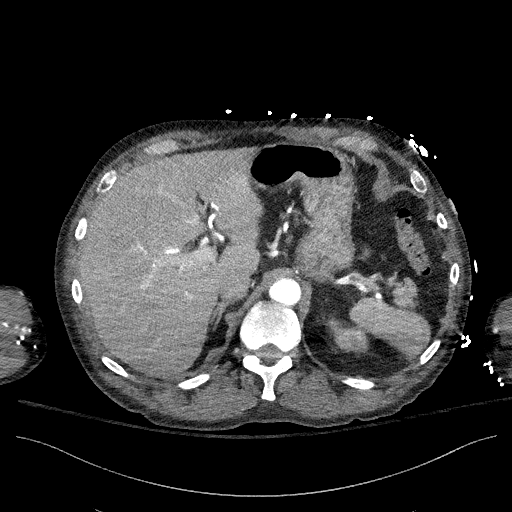
[im 240/347  soft-tissue]
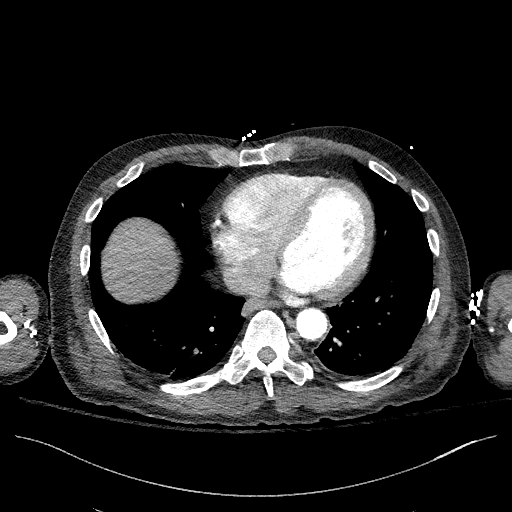
[im 267/347  soft-tissue]
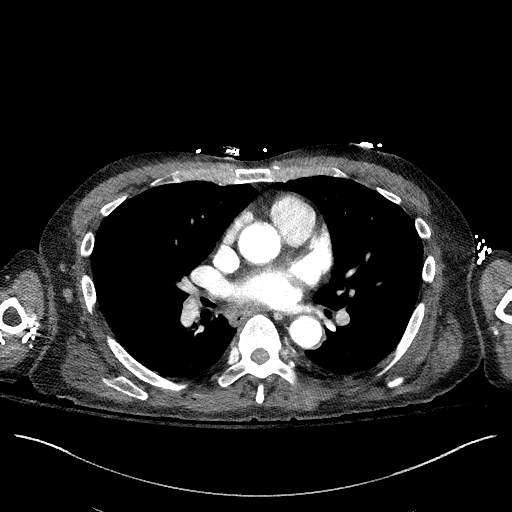
[im 267/347  bone]
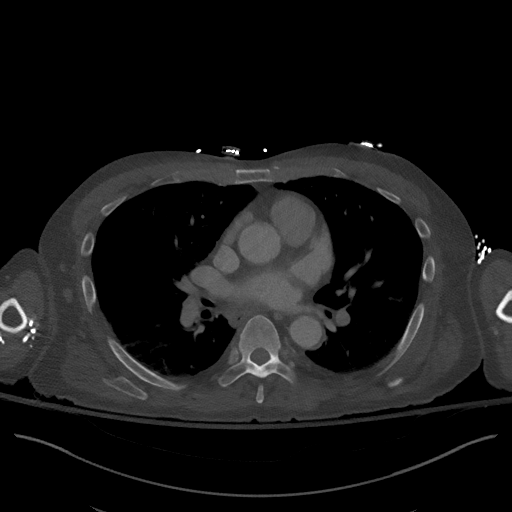
[im 293/347  soft-tissue]
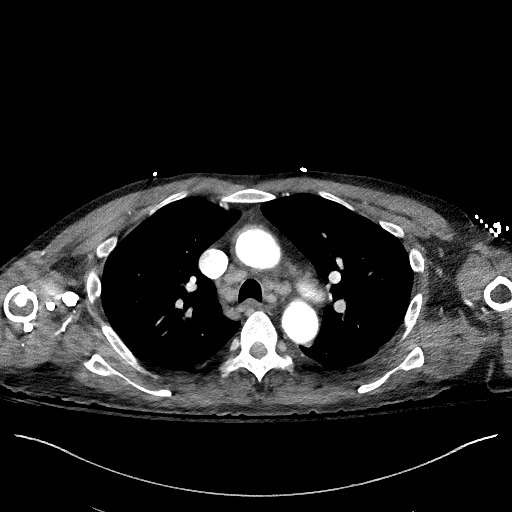
[im 320/347  soft-tissue]
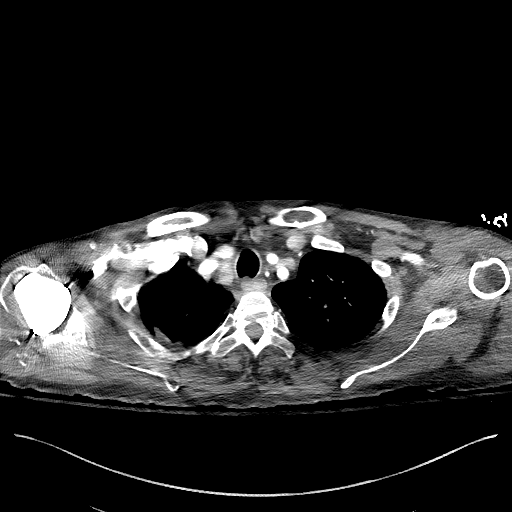

[Series 10: dissection 2mm cor · coronal · 0.95mm/px · 3 of 142 slices shown]
[im 36/142  soft-tissue]
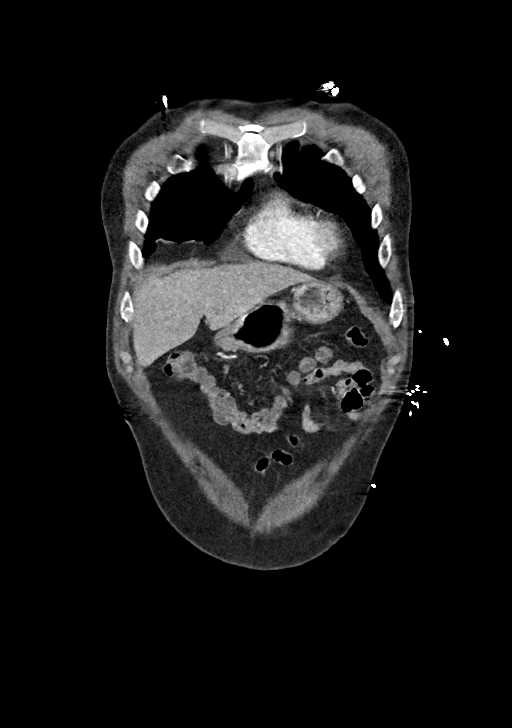
[im 71/142  soft-tissue]
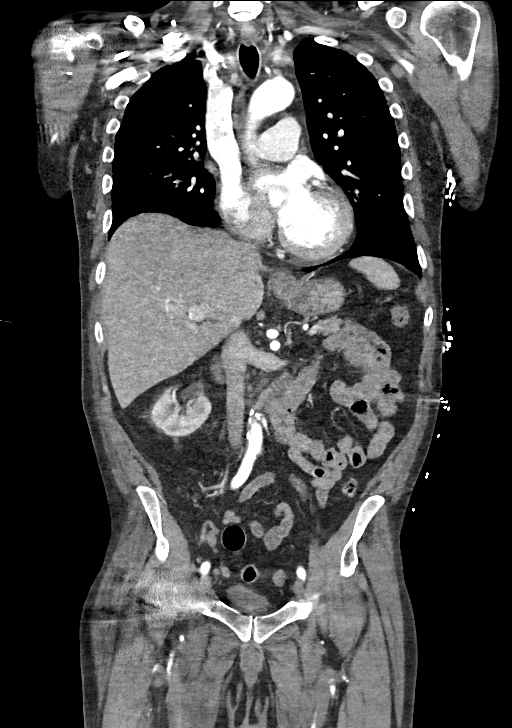
[im 106/142  soft-tissue]
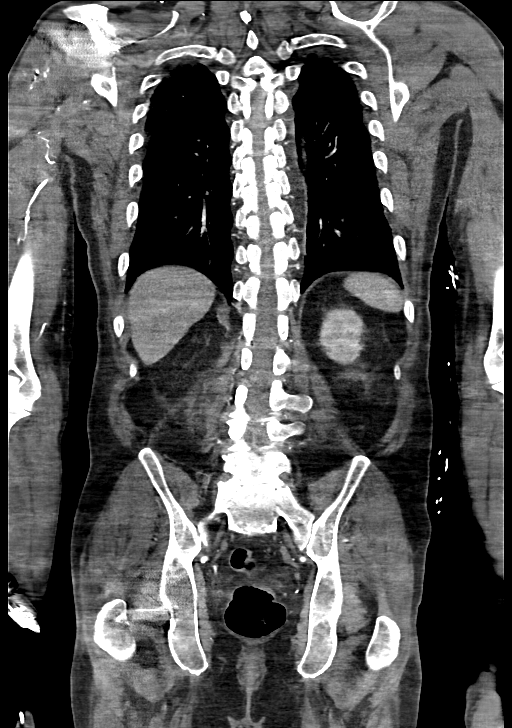

[14 of 46 positions shown; findings below may reference images not displayed]

Multidetector CT imaging through the chest, abdomen and pelvis was
performed using the standard protocol during bolus administration of
intravenous contrast. Multiplanar reconstructed images and MIPs were
obtained and reviewed to evaluate the vascular anatomy.

CONTRAST:  100mL OMNIPAQUE IOHEXOL 350 MG/ML SOLN IV
FINDINGS: CTA CHEST FINDINGS

Cardiovascular: Scattered atherosclerotic calcifications aorta,
proximal great vessels, and coronary arteries. Aorta normal caliber.
No intramural hematoma on precontrast imaging. Normal aortic
enhancement following contrast without evidence of dissection. Heart
unremarkable. No pericardial effusion. Pulmonary arteries grossly
patent on non targeted exam.

Mediastinum/Nodes: Base of cervical region normal appearance.
Esophagus unremarkable. Multiple normal and upper normal sized
mediastinal lymph nodes. 11 mm RIGHT paratracheal node image 59,
unchanged. 12 mm RIGHT paratracheal node image 52, previously 10 mm.
No hilar or axillary adenopathy. BILATERAL gynecomastia.

Lungs/Pleura: Linear subsegmental atelectasis BILATERAL lower lobes.
Lungs otherwise clear. No infiltrate, pleural effusion or
pneumothorax.

Musculoskeletal: RIGHT shoulder prosthesis. Degenerative changes
LEFT glenohumeral joint. Degenerative disc disease changes lower
cervical spine.

Review of the MIP images confirms the above findings.

CTA ABDOMEN AND PELVIS FINDINGS

VASCULAR

Aorta: Scattered atherosclerotic calcifications abdominal aorta.
Minimal eccentric noncalcified plaque. Normal caliber without
aneurysm or dissection.

Celiac: Widely patent

SMA: Widely patent

Renals: Patent BILATERAL renal arteries. Accessory renal artery to
upper pole LEFT kidney.

IMA: Patent

Inflow: Scattered atherosclerotic calcifications in the iliac
arteries and common femoral arteries without high-grade stenosis

Veins: Predominately unopacified at time of CTA imaging

Review of the MIP images confirms the above findings.

NON-VASCULAR

Hepatobiliary: Contracted gallbladder with single tiny gallstone.
Liver unremarkable.

Pancreas: Normal appearance

Spleen: Normal appearance

Adrenals/Urinary Tract: Adrenal glands normal appearance. Cortical
thinning in the kidneys. Tiny LEFT renal cyst. Kidneys and ureters
otherwise unremarkable. Bladder decompressed.

Stomach/Bowel: Scattered colonic diverticula greatest at ascending
colon. No evidence of diverticulitis. Normal appendix. Stomach and
bowel loops otherwise normal appearance.

Lymphatic: No adenopathy

Reproductive: Unremarkable prostate gland and seminal vesicles

Other: No free air or free fluid. Small umbilical hernia containing
fat. No inguinal hernias. No inflammatory process.

Musculoskeletal: RIGHT hip prosthesis. Soft tissue calcifications
adjacent to proximal RIGHT femur. Bones demineralized with
degenerative disc disease changes of the lumbar spine.

Review of the MIP images confirms the above findings.
IMPRESSION: Atherosclerotic calcifications of aorta, proximal great vessels, and
coronary arteries.

No evidence of aortic aneurysm or dissection.

Linear subsegmental atelectasis BILATERAL lower lobes.

Scattered colonic diverticulosis without evidence of diverticulitis.

Small umbilical hernia containing fat.

Cholelithiasis.

Nonspecific minimally enlarged mediastinal lymph nodes little
changed from 07/26/2019.

Aortic Atherosclerosis ([id number].E).

## 2021-04-24 DIAGNOSIS — F17200 Nicotine dependence, unspecified, uncomplicated: Secondary | ICD-10-CM | POA: Diagnosis not present

## 2021-04-24 DIAGNOSIS — J449 Chronic obstructive pulmonary disease, unspecified: Secondary | ICD-10-CM | POA: Diagnosis not present

## 2021-04-24 DIAGNOSIS — Z86718 Personal history of other venous thrombosis and embolism: Secondary | ICD-10-CM | POA: Diagnosis not present

## 2021-04-24 DIAGNOSIS — I2510 Atherosclerotic heart disease of native coronary artery without angina pectoris: Secondary | ICD-10-CM | POA: Diagnosis not present

## 2021-04-24 DIAGNOSIS — J9601 Acute respiratory failure with hypoxia: Secondary | ICD-10-CM | POA: Diagnosis not present

## 2021-04-24 DIAGNOSIS — D631 Anemia in chronic kidney disease: Secondary | ICD-10-CM | POA: Diagnosis not present

## 2021-04-24 DIAGNOSIS — J9811 Atelectasis: Secondary | ICD-10-CM | POA: Diagnosis not present

## 2021-04-24 DIAGNOSIS — Z48815 Encounter for surgical aftercare following surgery on the digestive system: Secondary | ICD-10-CM | POA: Diagnosis not present

## 2021-04-24 DIAGNOSIS — I130 Hypertensive heart and chronic kidney disease with heart failure and stage 1 through stage 4 chronic kidney disease, or unspecified chronic kidney disease: Secondary | ICD-10-CM | POA: Diagnosis not present

## 2021-04-24 DIAGNOSIS — N184 Chronic kidney disease, stage 4 (severe): Secondary | ICD-10-CM | POA: Diagnosis not present

## 2021-04-24 DIAGNOSIS — Z7901 Long term (current) use of anticoagulants: Secondary | ICD-10-CM | POA: Diagnosis not present

## 2021-04-24 DIAGNOSIS — E785 Hyperlipidemia, unspecified: Secondary | ICD-10-CM | POA: Diagnosis not present

## 2021-04-24 DIAGNOSIS — I5042 Chronic combined systolic (congestive) and diastolic (congestive) heart failure: Secondary | ICD-10-CM | POA: Diagnosis not present

## 2021-04-24 DIAGNOSIS — K3521 Acute appendicitis with generalized peritonitis, with abscess: Secondary | ICD-10-CM | POA: Diagnosis not present

## 2021-04-24 DIAGNOSIS — Z794 Long term (current) use of insulin: Secondary | ICD-10-CM | POA: Diagnosis not present

## 2021-04-24 DIAGNOSIS — E46 Unspecified protein-calorie malnutrition: Secondary | ICD-10-CM | POA: Diagnosis not present

## 2021-04-24 DIAGNOSIS — E1122 Type 2 diabetes mellitus with diabetic chronic kidney disease: Secondary | ICD-10-CM | POA: Diagnosis not present

## 2021-04-24 DIAGNOSIS — M159 Polyosteoarthritis, unspecified: Secondary | ICD-10-CM | POA: Diagnosis not present

## 2021-04-27 ENCOUNTER — Other Ambulatory Visit: Payer: Self-pay

## 2021-04-27 ENCOUNTER — Encounter: Payer: Self-pay | Admitting: Family

## 2021-04-27 ENCOUNTER — Ambulatory Visit: Payer: Medicare Other | Attending: Family | Admitting: Family

## 2021-04-27 VITALS — BP 155/92 | HR 88 | Resp 20 | Ht 76.0 in | Wt 179.4 lb

## 2021-04-27 DIAGNOSIS — J45909 Unspecified asthma, uncomplicated: Secondary | ICD-10-CM | POA: Insufficient documentation

## 2021-04-27 DIAGNOSIS — R109 Unspecified abdominal pain: Secondary | ICD-10-CM | POA: Insufficient documentation

## 2021-04-27 DIAGNOSIS — F1010 Alcohol abuse, uncomplicated: Secondary | ICD-10-CM | POA: Diagnosis not present

## 2021-04-27 DIAGNOSIS — I110 Hypertensive heart disease with heart failure: Secondary | ICD-10-CM | POA: Diagnosis not present

## 2021-04-27 DIAGNOSIS — E785 Hyperlipidemia, unspecified: Secondary | ICD-10-CM | POA: Insufficient documentation

## 2021-04-27 DIAGNOSIS — M1990 Unspecified osteoarthritis, unspecified site: Secondary | ICD-10-CM | POA: Insufficient documentation

## 2021-04-27 DIAGNOSIS — I2510 Atherosclerotic heart disease of native coronary artery without angina pectoris: Secondary | ICD-10-CM | POA: Insufficient documentation

## 2021-04-27 DIAGNOSIS — F1091 Alcohol use, unspecified, in remission: Secondary | ICD-10-CM | POA: Insufficient documentation

## 2021-04-27 DIAGNOSIS — E119 Type 2 diabetes mellitus without complications: Secondary | ICD-10-CM | POA: Diagnosis not present

## 2021-04-27 DIAGNOSIS — I5022 Chronic systolic (congestive) heart failure: Secondary | ICD-10-CM

## 2021-04-27 DIAGNOSIS — I10 Essential (primary) hypertension: Secondary | ICD-10-CM | POA: Diagnosis not present

## 2021-04-27 DIAGNOSIS — Z720 Tobacco use: Secondary | ICD-10-CM

## 2021-04-27 DIAGNOSIS — I429 Cardiomyopathy, unspecified: Secondary | ICD-10-CM | POA: Diagnosis not present

## 2021-04-27 DIAGNOSIS — D649 Anemia, unspecified: Secondary | ICD-10-CM | POA: Insufficient documentation

## 2021-04-27 DIAGNOSIS — Z87891 Personal history of nicotine dependence: Secondary | ICD-10-CM | POA: Diagnosis not present

## 2021-04-27 DIAGNOSIS — M109 Gout, unspecified: Secondary | ICD-10-CM | POA: Insufficient documentation

## 2021-04-27 DIAGNOSIS — M549 Dorsalgia, unspecified: Secondary | ICD-10-CM | POA: Diagnosis not present

## 2021-04-27 DIAGNOSIS — G8929 Other chronic pain: Secondary | ICD-10-CM | POA: Insufficient documentation

## 2021-04-27 DIAGNOSIS — Z79899 Other long term (current) drug therapy: Secondary | ICD-10-CM | POA: Diagnosis not present

## 2021-04-27 DIAGNOSIS — G8918 Other acute postprocedural pain: Secondary | ICD-10-CM | POA: Diagnosis not present

## 2021-04-27 DIAGNOSIS — Z8719 Personal history of other diseases of the digestive system: Secondary | ICD-10-CM | POA: Diagnosis not present

## 2021-04-27 NOTE — Patient Instructions (Signed)
Continue weighing daily and call for an overnight weight gain of 3 pounds or more or a weekly weight gain of more than 5 pounds.  ° °[facility] will be moving around the corner to [id number] mid-February. Our phone number will remain the same ° °

## 2021-04-27 NOTE — Progress Notes (Signed)
Patient ID: Travis [name], male    DOB: Feb 07, 1956, 66 y.o.   MRN: [id number]  HPI  Travis [name] is a 66 y/o male with a history of osteoarthritis, hyperlipidemia, gout, HTN, GI bleed, DM, CAD, anemia, asthma, previous alcohol use, current tobacco use and chronic heart failure.  Echo report from 01/07/21 reviewed and showed an EF of 45-50% along with mild Travis. Echo report from 11/09/19 reviewed and showed an EF of 45-50% along with mild LVH. Echo report from 06/04/19 reviewed and showed an EF of 35-40% along with mild LVH. Reviewed last echo done 08/08/16 which showed an EF of 35-40%. This is an improvement from his last echo which was done 01/26/15 and showed an EF of 25-30% along with trivial Travis/ TR. EF had dropped from 2015 when it was 45-50%.   Had a cardiac catheterization done 01/28/15 which showed 10% stenosis in Prox RCA to Mid RCA and 30% stenosis in Prox LAD to Mid LAD which is not much different from previous angiography. Cardiomyopathy felt to be due to previous alcohol use.   Had a lengthy admission (39 days) back in October 2022 due to sepsis due to ruptured appendix and abdominal abscess. Spent time in rehab but has been back home for ~ the last month.   He presents today for a follow-up visit with a chief complaint of minimal fatigue upon moderate exertion. Describes this as chronic but is slowly improving after his extensive hospital stay. Has finished his PT. He has associated shortness of breath, abdominal pain (improving from surgery) and chronic back pain along with this. He denies any dizziness, difficulty sleeping, abdominal distention, palpitations, pedal edema, chest pain, cough or weight gain.   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 05/2019 Echo: EF 35-40%; b. 10/2019 Echo: EF 45-50%.   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 12/2010 Echo: EF 20-25%; b 08/2013 Echo: EF 45-50%; c. 01/2015 Echo: EF 25-30%; d. 07/2016 Echo: EF 35-40%; e. 05/2019 Echo: EF 35-40%; d. 10/2019 Echo: EF 45-50%, mild LVH, mild red RV fxn, mildl dil RA, Triv Travis. Mild to mod Ao Sclerosis w/o stenosis.   Hyperlipidemia    Hypertension    Nonischemic cardiomyopathy (HCC)    Nonobstructive Coronary artery disease    a. 01/2015 Cath: LM nl, LAD 30p/m, LCX nl, RCA 10p/m, RPDA min irregs.   Osteoarthritis    Pneumonia    TIA (transient ischemic attack)    Tobacco abuse    Vision loss    peripherial vision only left eye.Central Retinal  artery occusion    Past Surgical History:  Procedure Laterality Date   BIOPSY  11/25/2019   Procedure: BIOPSY;  Surgeon: Ladene Artist, MD;  Location: [place];  Service: Endoscopy;;   CARDIAC CATHETERIZATION     CARDIAC CATHETERIZATION N/A 01/28/2015   Procedure: Left Heart Cath and Coronary Angiography;  Surgeon: Wellington Hampshire, MD;  Location: [facility];  Service: Cardiovascular;  Laterality: N/A;  COLONOSCOPY     ESOPHAGOGASTRODUODENOSCOPY (EGD) WITH PROPOFOL N/A 11/25/2019   Procedure: ESOPHAGOGASTRODUODENOSCOPY (EGD) WITH PROPOFOL;  Surgeon: Ladene Artist, MD;  Location: [facility] ENDOSCOPY;  Service: Endoscopy;  Laterality: N/A;   HIP ARTHROPLASTY Right 03/15/2013   Procedure: ARTHROPLASTY BIPOLAR HIP;  Surgeon: [name], MD;  Location: [place];  Service: Orthopedics;  Laterality: Right;   LAPAROSCOPIC APPENDECTOMY N/A 01/01/2021   Procedure: DIAGNOSTIC LAPAROSCOPY; EXPLORATORY LAPAROTOMY, ILEUCECECTOMY, DRAINAGE OF ABDOMINAL ABSCESS;  Surgeon: Georganna Skeans, MD;  Location: [place];  Service: General;  Laterality: N/A;   SPLENECTOMY, TOTAL N/A 01/06/2021   Procedure:  SPLENECTOMY;  Surgeon: Jesusita Oka, MD;  Location: [place];  Service: General;  Laterality: N/A;   TOTAL SHOULDER ARTHROPLASTY Right 09/01/2015   Procedure: RIGHT TOTAL SHOULDER ARTHROPLASTY;  Surgeon: [name], MD;  Location: [place];  Service: Orthopedics;  Laterality: Right;   Family History  Problem Relation Age of Onset   Diabetes Mother    Hypertension Mother    Diabetes Father    Hypertension Father    Diabetes Sister    Dementia Brother    Cancer Neg Hx    Heart disease Neg Hx    Stroke Neg Hx    Colon cancer Neg Hx    Esophageal cancer Neg Hx    Rectal cancer Neg Hx    Stomach cancer Neg Hx    Social History   Tobacco Use   Smoking status: Former    Packs/day: 0.75    Years: 30.00    Pack years: 22.50    Types: Cigarettes   Smokeless tobacco: Never  Substance Use Topics   Alcohol use: Not Currently    Alcohol/week: 1.0 standard drink    Types: 1 Glasses of wine per week    Comment: occasionally   No Known Allergies Past Medical History:  Diagnosis Date   Alcohol abuse    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 05/2019 Echo: EF 35-40%; b. 10/2019 Echo: EF 45-50%.   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 12/2010 Echo: EF 20-25%; b 08/2013 Echo: EF 45-50%; c. 01/2015 Echo: EF 25-30%; d. 07/2016 Echo: EF 35-40%; e. 05/2019 Echo: EF 35-40%; d. 10/2019 Echo: EF 45-50%, mild LVH, mild red RV fxn, mildl dil RA, Triv Travis. Mild to mod Ao Sclerosis w/o stenosis.   Hyperlipidemia    Hypertension    Nonischemic cardiomyopathy (HCC)    Nonobstructive Coronary artery disease    a. 01/2015 Cath: LM nl, LAD 30p/m, LCX nl, RCA 10p/m, RPDA min irregs.   Osteoarthritis     Pneumonia    TIA (transient ischemic attack)    Tobacco abuse    Vision loss    peripherial vision only left eye.Central Retinal  artery occusion    Past Surgical History:  Procedure Laterality Date   BIOPSY  11/25/2019   Procedure: BIOPSY;  Surgeon: Ladene Artist, MD;  Location: [facility] ENDOSCOPY;  Service: Endoscopy;;   CARDIAC CATHETERIZATION     CARDIAC CATHETERIZATION N/A 01/28/2015   Procedure: Left Heart Cath and Coronary Angiography;  Surgeon: Wellington Hampshire, MD;  Location: [place] [facility];  Service: Cardiovascular;  Laterality: N/A;   COLONOSCOPY     ESOPHAGOGASTRODUODENOSCOPY (EGD) WITH PROPOFOL N/A 11/25/2019   Procedure: ESOPHAGOGASTRODUODENOSCOPY (EGD) WITH PROPOFOL;  Surgeon: Ladene Artist, MD;  Location: [facility] ENDOSCOPY;  Service: Endoscopy;  Laterality: N/A;   HIP ARTHROPLASTY Right 03/15/2013   Procedure: ARTHROPLASTY BIPOLAR HIP;  Surgeon: [name], MD;  Location: [place];  Service: Orthopedics;  Laterality: Right;   LAPAROSCOPIC APPENDECTOMY N/A 01/01/2021   Procedure: DIAGNOSTIC LAPAROSCOPY; EXPLORATORY LAPAROTOMY, ILEUCECECTOMY, DRAINAGE OF ABDOMINAL ABSCESS;  Surgeon: Georganna Skeans, MD;  Location: [place];  Service: General;  Laterality: N/A;   SPLENECTOMY, TOTAL N/A 01/06/2021   Procedure: SPLENECTOMY;  Surgeon: Jesusita Oka, MD;  Location: [place];  Service: General;  Laterality: N/A;   TOTAL SHOULDER ARTHROPLASTY Right 09/01/2015   Procedure: RIGHT TOTAL SHOULDER ARTHROPLASTY;  Surgeon: [name], MD;  Location: [place];  Service: Orthopedics;  Laterality: Right;   Family History  Problem Relation Age of Onset   Diabetes Mother    Hypertension Mother    Diabetes Father    Hypertension Father    Diabetes Sister    Dementia Brother    Cancer Neg Hx    Heart disease Neg Hx    Stroke Neg Hx    Colon cancer Neg Hx    Esophageal cancer Neg Hx    Rectal cancer Neg Hx    Stomach cancer Neg Hx    Social History   Tobacco Use   Smoking status:  Former    Packs/day: 0.75    Years: 30.00    Pack years: 22.50    Types: Cigarettes   Smokeless tobacco: Never  Substance Use Topics   Alcohol use: Not Currently    Alcohol/week: 1.0 standard drink    Types: 1 Glasses of wine per week    Comment: occasionally   No Known Allergies  Prior to Admission medications   Medication Sig Start Date End Date Taking? Authorizing Provider  acetaminophen (TYLENOL) 500 MG tablet Take 2 tablets (1,000 mg total) by mouth every 8 (eight) hours as needed. 02/06/21  Yes Ghimire, Henreitta Leber, MD  apixaban (ELIQUIS) 5 MG TABS tablet Take 1 tablet (5 mg total) by mouth 2 (two) times daily. 03/23/21  Yes [name], NP  atorvastatin (LIPITOR) 20 MG tablet Take 1 tablet (20 mg total) by mouth daily. 03/22/21  Yes [name], NP  carvedilol (COREG) 6.25 MG tablet Take 6.25 mg by mouth 2 (two) times daily with a meal.   Yes [provider]  ferrous sulfate 325 (65 FE) MG tablet Take 1 tablet (325 mg total) by mouth daily with breakfast. 06/02/20  Yes Baity, Coralie Keens, NP  fluticasone (FLONASE) 50 MCG/ACT nasal spray Place 1-2 sprays into both nostrils daily as needed for allergies or rhinitis.   Yes [provider]  hydroxypropyl methylcellulose / hypromellose (ISOPTO TEARS / GONIOVISC) 2.5 % ophthalmic solution Place 1 drop into both eyes 3 (three) times daily as needed for dry eyes.   Yes [provider]  magnesium oxide (MAG-OX) 400 MG tablet Take 1 tablet (400 mg total) by mouth daily. 12/19/20  Yes [name], NP  methocarbamol (ROBAXIN) 500 MG tablet Take 1 tablet (500 mg total) by mouth every 8 (eight) hours as needed for muscle spasms. 03/06/21  Yes Ria Bush, MD  multivitamin (ONE-A-DAY MEN'S) TABS tablet Take 1 tablet by mouth daily with breakfast.  Yes [provider]  Omega-3 Fatty Acids (FISH OIL PO) Take 1 capsule by mouth daily.   Yes [provider]  pantoprazole (PROTONIX) 40 MG tablet  Take 1 tablet (40 mg total) by mouth daily. 06/02/20  Yes Baity, Coralie Keens, NP  triamcinolone cream (KENALOG) 0.1 % APPLY TO AFFECTED AREAS TWICE A DAY AS NEEDED. AVOID FACE, GROIN, OR UNDERARMS. 11/01/20  Yes Dutch Quint B, FNP  albuterol (VENTOLIN HFA) 108 (90 Base) MCG/ACT inhaler Inhale 2 puffs into the lungs every 6 (six) hours as needed for wheezing or shortness of breath. Patient not taking: Reported on 04/11/2021    [provider]  Blood Pressure KIT Check b/p once daily. Based on insurance preference Patient not taking: Reported on 04/11/2021 03/03/21   [name], NP  glucose blood (ACCU-CHEK GUIDE) test strip Check sugar 4 times a week DX E11.69 Patient not taking: Reported on 04/11/2021 03/03/21   [name], NP  mirtazapine (REMERON) 30 MG tablet Take 1 tablet (30 mg total) by mouth at bedtime. Patient not taking: Reported on 04/27/2021 02/06/21   Jonetta Osgood, MD    Review of Systems  Constitutional:  Positive for fatigue. Negative for appetite change.  HENT:  Negative for congestion, postnasal drip and sore throat.   Eyes: Negative.   Respiratory:  Positive for shortness of breath. Negative for cough, chest tightness and wheezing.   Cardiovascular:  Negative for chest pain, palpitations and leg swelling.  Gastrointestinal:  Positive for abdominal pain. Negative for abdominal distention.  Endocrine: Negative.   Genitourinary: Negative.   Musculoskeletal:  Positive for arthralgias (knee pain) and back pain.  Skin: Negative.   Allergic/Immunologic: Negative.   Neurological:  Negative for dizziness and light-headedness.  Hematological:  Negative for adenopathy. Does not bruise/bleed easily.  Psychiatric/Behavioral:  Negative for dysphoric mood and sleep disturbance (sleeping on 2 pillows). The patient is not nervous/anxious.    Vitals:   04/27/21 1535  BP: (!) 155/92  Pulse: 88  Resp: 20  SpO2: 99%  Weight: 179 lb 6 oz (81.4 kg)  Height: '6\' 4"'  (1.93 m)    Wt Readings from Last 3 Encounters:  04/27/21 179 lb 6 oz (81.4 kg)  04/11/21 179 lb (81.2 kg)  04/07/21 176 lb 4 oz (79.9 kg)   Lab Results  Component Value Date   CREATININE 0.93 03/03/2021   CREATININE 1.08 02/08/2021   CREATININE 1.03 02/07/2021    Physical Exam Vitals and nursing note reviewed.  Constitutional:      Appearance: Normal appearance.  HENT:     Head: Normocephalic and atraumatic.  Cardiovascular:     Rate and Rhythm: Normal rate and regular rhythm.  Pulmonary:     Effort: Pulmonary effort is normal. No respiratory distress.     Breath sounds: No wheezing or rales.  Abdominal:     General: There is no distension.     Palpations: Abdomen is soft.     Tenderness: There is no abdominal tenderness.  Musculoskeletal:        General: No tenderness.     Cervical back: Normal range of motion and neck supple.     Right lower leg: No edema.     Left lower leg: No edema.  Skin:    General: Skin is warm and dry.  Neurological:     General: No focal deficit present.     Mental Status: He is alert and oriented to person, place, and time.  Psychiatric:  Mood and Affect: Mood normal.        Behavior: Behavior normal.        Thought Content: Thought content normal.     Assessment & Plan:  1: Chronic heart failure with reduced ejection fraction- - NYHA class II - euvolemic today - weighing daily; reminded to call for an overnight weight gain of >2 pounds/weekly weight gain of >5 pounds.  - weight down 12 pounds from last visit here 6 months ago - working on getting his strength back; has finished PT - trying to not add salt to his food  - Drinking approximately 64 oz of fluid daily - saw cardiology ([place]) 11/18/20; needs to make f/u appt; NT tried calling today but couldn't get through so she will try tomorrow and get an appt scheduled for him - would like to resume entresto but will hold off until nephrology sees him - currently on GDMT of carvedilol   - discussed making med changes at next visit - palliative care visit done 04/13/21 - BNP 01/23/21 was 73.6  2: HTN- - BP mildly elevated today - BMP from 02/07/21 reviewed and shows sodium 136, potassium 3.9, creatinine 1.03 and GFR >60 - saw his PCP Charmian Muff) 04/07/21 - saw nephrology Lanora Manis) 11/24/20  3: Tobacco use- - has not smoked anything since being released from his lengthy admission - congratulated him and continued cessation discussed  4: Alcohol use- - has not drank any alcohol since admission; says that that scared him - continued cessation discussed   Patient did not bring his medications nor a list. Each medication was verbally reviewed with the patient and he was encouraged to bring the bottles to every visit to confirm accuracy of list.   Return in 1 month, sooner if needed

## 2021-05-09 ENCOUNTER — Encounter: Payer: Self-pay | Admitting: Nurse Practitioner

## 2021-05-09 ENCOUNTER — Other Ambulatory Visit: Payer: Self-pay

## 2021-05-09 ENCOUNTER — Other Ambulatory Visit: Payer: Medicare Other | Admitting: Nurse Practitioner

## 2021-05-09 DIAGNOSIS — Z515 Encounter for palliative care: Secondary | ICD-10-CM

## 2021-05-09 DIAGNOSIS — R5381 Other malaise: Secondary | ICD-10-CM

## 2021-05-09 NOTE — Progress Notes (Signed)
Travis [name] Consult Note Telephone: [phone]  Fax: [phone]    Date of encounter: 05/09/21 12:44 PM PATIENT NAME: Travis [name] [address] [place] [address]   [phone] (home)  DOB: [birth year] MRN: [id number] PRIMARY CARE PROVIDER:    [name], NP,  [place] [address] [phone] RESPONSIBLE PARTY:    Contact Information     Name Relation Home Work Mobile   Peeler,[name] Niece [phone]  [phone]   [name] [phone]  [phone]   Abdias, Hickam   [phone]      Due to the COVID-19 crisis, this visit was done via telemedicine from my office and it was initiated and consent by this patient and or family.  I connected with  [name] OR PROXY on 05/09/21 by a by phone as video not available enabled telemedicine application and verified that I am speaking with the correct person using two identifiers.   I discussed the limitations of evaluation and management by telemedicine. The patient expressed understanding and agreed to proceed. Palliative Care was asked to follow this patient by consultation request of  [name], NP to address advance care planning and complex medical decision making. This is a follow up visit.                                  ASSESSMENT AND PLAN / RECOMMENDATIONS:  Symptom Management/Plan: 1. Advance Care Planning; MOST form completed on 01/15/2021 in [facility] to read DNR, comfort measures, abx determined, IVF determined, no feeding tube.    2. Review; Goals of Care: Goals include to maximize quality of life and symptom management. Our advance care planning conversation included a discussion about:    The value and importance of advance care planning  Exploration of personal, cultural or spiritual beliefs that might influence medical decisions  Exploration of goals of care in the event of a sudden injury or illness   Identification and preparation of a healthcare agent  Review and updating or creation of an advance directive document.   3. Palliative care encounter; Palliative care encounter; Palliative medicine team will continue to support patient, patient's family, and medical team. Visit consisted of counseling and education dealing with the complex and emotionally intense issues of symptom management and palliative care in the setting of serious and potentially life-threatening illness   4. Debility secondary to decompensation, CHF, CAD, COPD, recent hospitalization, discussed importance of mobility, fall risk. Discussed at length with Travis Rosencrans to continue mobility, strengthening. Travis Allums endorses no recent falls, he continues to "walk up the road, every day if able" "today is too wet outside"   5. Chronic pain secondary OA monitoring pain, keeping a log. Continue to use tylenol. Talked about non-medication pain modality   6. f/u 2 month for ongoing monitoring chronic disease progression, ongoing discussions complex medical decision making Follow up Palliative Care Visit: Palliative care will continue to follow for complex medical decision making, advance care planning, and clarification of goals. Return 8 weeks or prn.  I spent 47 minutes providing this consultation. More than 50% of the time in this consultation was spent in counseling and care coordination.  PPS: 50%  Chief Complaint: Follow up palliative consult for complex medical decision making  HISTORY OF PRESENT ILLNESS:  Travis [name] is a 66 y.o. year old male  with multiple medical problems  including HFrEF, CM, CAD, CKD, COPD, asthma, central retinal artery occlusion of left eye, asthma, anemia, DM, HLD, HTN, OA, h/o GI bleed, gout, h/o alcohol abuse, tobacco abuse. 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 I called Travis [name] for telemedicine telephonic follow up PC visit. Travis. Mapps in agreement. We talked how he has been feeling. Travis [name] endorses he has been feeling better. Travis [name] endorses he was getting ready to go for a walk to the park. Travis [name] endorses has been going for frequent walks. No recent falls. Travis [name] endorses he is able to bath, dress, feed himself. He makes his own food continues to improved appetite. Travis [name] talked about daily routine. Reviewed medical goals, We talked about symptoms, ros. We talked extensively about pain, defer to Travis [name]. We talked about role pc in poc. We talked about quality of life, Travis [name] endorses he has no needs at present time. Travis [name] was thankful for visit. Travis [name] in agreement to schedule f/u in person PC visit. Scheduled. Therapeutic listening, emotional support provided. Questions answered.   History obtained from review of EMR, discussion with Travis. Stanke.  I reviewed available labs, medications, imaging, studies and related documents from the EMR.  Records reviewed and summarized above.   ROS 10 point system reviewed with Travis Jarosz all negative except HPI  Physical Exam: deferred Thank you for the opportunity to participate in the care of Travis [name].  The palliative care team will continue to follow. Please call our office at [phone] if we can be of additional assistance.   Krissa Utke Ihor Gully, NP

## 2021-05-11 DIAGNOSIS — I82622 Acute embolism and thrombosis of deep veins of left upper extremity: Secondary | ICD-10-CM | POA: Diagnosis not present

## 2021-05-11 DIAGNOSIS — M1711 Unilateral primary osteoarthritis, right knee: Secondary | ICD-10-CM | POA: Diagnosis not present

## 2021-05-12 ENCOUNTER — Ambulatory Visit (INDEPENDENT_AMBULATORY_CARE_PROVIDER_SITE_OTHER): Payer: Medicare Other

## 2021-05-12 DIAGNOSIS — I10 Essential (primary) hypertension: Secondary | ICD-10-CM

## 2021-05-12 DIAGNOSIS — I5022 Chronic systolic (congestive) heart failure: Secondary | ICD-10-CM

## 2021-05-12 NOTE — Chronic Care Management (AMB) (Signed)
Chronic Care Management   CCM RN Visit Note  05/12/2021 Name: Travis [name] MRN: [id number] DOB: 1956-03-17  Subjective: Travis [name] is a 66 y.o. year old male who is a primary care patient of [name], NP. The care management team was consulted for assistance with disease management and care coordination needs.    Engaged with patient by telephone for follow up visit in response to provider referral for case management and/or care coordination services.   Consent to Services:  The patient was given information about Chronic Care Management services, agreed to services, and gave verbal consent prior to initiation of services.  Please see initial visit note for detailed documentation.   Patient agreed to services and verbal consent obtained.   Assessment: Review of patient past medical history, allergies, medications, health status, including review of consultants reports, laboratory and other test data, was performed as part of comprehensive evaluation and provision of chronic care management services.   SDOH (Social Determinants of Health) assessments and interventions performed:    CCM Care Plan  No Known Allergies  Outpatient Encounter Medications as of 05/12/2021  Medication Sig   acetaminophen (TYLENOL) 500 MG tablet Take 2 tablets (1,000 mg total) by mouth every 8 (eight) hours as needed.   apixaban (ELIQUIS) 5 MG TABS tablet Take 1 tablet (5 mg total) by mouth 2 (two) times daily.   atorvastatin (LIPITOR) 20 MG tablet Take 1 tablet (20 mg total) by mouth daily.   carvedilol (COREG) 6.25 MG tablet Take 6.25 mg by mouth 2 (two) times daily with a meal.   ferrous sulfate 325 (65 FE) MG tablet Take 1 tablet (325 mg total) by mouth daily with breakfast.   fluticasone (FLONASE) 50 MCG/ACT nasal spray Place 1-2 sprays into both nostrils daily as needed for allergies or rhinitis.   hydroxypropyl methylcellulose / hypromellose (ISOPTO TEARS / GONIOVISC) 2.5 % ophthalmic solution  Place 1 drop into both eyes 3 (three) times daily as needed for dry eyes.   magnesium oxide (MAG-OX) 400 MG tablet Take 1 tablet (400 mg total) by mouth daily.   methocarbamol (ROBAXIN) 500 MG tablet Take 1 tablet (500 mg total) by mouth every 8 (eight) hours as needed for muscle spasms.   multivitamin (ONE-A-DAY MEN'S) TABS tablet Take 1 tablet by mouth daily with breakfast.   Omega-3 Fatty Acids (FISH OIL PO) Take 1 capsule by mouth daily.   pantoprazole (PROTONIX) 40 MG tablet Take 1 tablet (40 mg total) by mouth daily.   triamcinolone cream (KENALOG) 0.1 % APPLY TO AFFECTED AREAS TWICE A DAY AS NEEDED. AVOID FACE, GROIN, OR UNDERARMS.   albuterol (VENTOLIN HFA) 108 (90 Base) MCG/ACT inhaler Inhale 2 puffs into the lungs every 6 (six) hours as needed for wheezing or shortness of breath. (Patient not taking: Reported on 04/11/2021)   Blood Pressure KIT Check b/p once daily. Based on insurance preference (Patient not taking: Reported on 04/11/2021)   glucose blood (ACCU-CHEK GUIDE) test strip Check sugar 4 times a week DX E11.69 (Patient not taking: Reported on 04/11/2021)   mirtazapine (REMERON) 30 MG tablet Take 1 tablet (30 mg total) by mouth at bedtime. (Patient not taking: Reported on 04/27/2021)   No facility-administered encounter medications on file as of 05/12/2021.    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   Protein-calorie malnutrition, severe 05/27/2020   Hypotension 05/26/2020   AKI (acute kidney injury) ([place]) 05/26/2020   Alcohol use 11/20/2019   CKD (chronic kidney disease), stage III ([place]) 11/20/2019   TIA (transient ischemic attack) 11/08/2019   Chronic bilateral low back pain without sciatica  06/26/2019   Osteoarthritis 05/09/2016   COPD (chronic obstructive pulmonary disease) ([place]) 05/09/2016   Tachycardia 05/23/2015   Essential hypertension 04/18/2015   Literacy level of illiterate 06/04/2014   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   Cardiomyopathy, nonischemic ([place]) 89/16/9450   Chronic systolic heart failure ([place]) 01/05/2011   Tobacco use 01/05/2011   Diabetes ([place]) 11/30/2008   HLD (hyperlipidemia) 11/30/2008   Gout 11/30/2008   Iron deficiency anemia 11/30/2008    Conditions to be addressed/monitored:CHF and anemia  Care Plan : [facility] plan of care  Updates made by Dannielle Karvonen, RN since 05/12/2021 12:00 AM     Problem: Chronic disease management, education, and / or care coordination   Priority: High     Long-Range Goal: Development of plan of care to address chronic disease management and / or care coordination needs   Start Date: 02/24/2021  Expected End Date: 07/21/2021  Priority: High  Note:   Current Barriers:  Knowledge Deficits related to plan of care for management of CHF and Anemia  Chronic Disease Management support and education needs related to CHF and Anemia Medication management:  Referral made to pharmacist Patient states he is doing ok today.  He reports ongoing follow up with Palliative care.  Patient reports last oncology visit was 04/11/21 and follow up with primary care provider was 04/07/21.  Patient states his physical therapy was discontinued approximately 2 weeks ago.  He states he continues with some fatigue.  Patient states he has a follow up with the heart failure clinic on 05/25/21. He states he is not weighing consistently at home.   He states his appetite is fair and supplements with boost or Ensure.  Patient states he is also trying to exercise more by taking occasional walks outside when the weather is good.  RNCM Clinical Goal(s):  Patient will verbalize basic understanding of CHF and Anemia disease process and self health  management plan as evidenced by patient report and/ or notation in chart take all medications exactly as prescribed and will call provider for medication related questions as evidenced by patient report and/ or notation in chart    demonstrate understanding of rationale for each prescribed medication as evidenced by patient report and/ or notation in chart    attend all scheduled medical appointments:   as evidenced by patient report and/ or notation in chart        continue to work with RN Care Manager and/or Social Worker to address care management and care coordination needs related to CHF and anemia as evidenced by adherence to CM Team Scheduled appointments     work with pharmacist to address medication management related to CHF and anemia as evidenced by review of EMR and patient or pharmacist report    through collaboration with Consulting civil engineer, provider, and care team.   Interventions: 1:1 collaboration with primary care provider regarding development and update of comprehensive plan of care as evidenced by provider attestation and co-signature Inter-disciplinary care team collaboration (see longitudinal plan of care) Evaluation of current treatment plan related to  self management and patient's adherence to  plan as established by provider   Anemia/Bleeding Interventions:  (Status:  Goal on track:  Yes.) Long Term Goal  Assessment of understanding of anemia/bleeding disorder diagnosis  Basic overview and discussion of anemia/bleeding disorder or acute disease state  Medications reviewed and compliance discussed   Counseled on bleeding risk associated with Eliquis and importance of self-monitoring for signs/symptoms of bleeding Counseled on importance of regular laboratory monitoring as directed by provider Lab Results  Component Value Date   WBC 6.6 04/11/2021   HGB 12.5 (L) 04/11/2021   HCT 39.9 04/11/2021   MCV 96.1 04/11/2021   PLT 308 04/11/2021    Heart Failure Interventions:   (Status:  Goal on track:  Yes.) Long Term Goal Provided education on low sodium diet Discussed importance of daily weight and advised patient to weigh and record daily Discussed the importance of keeping all appointments with provider Discussed heart failure action plan/ zones. Advised to notify provider for mild/ moderate symptoms and call 911 for severe symptoms Medications reviewed and compliance discussed.   Post hospital follow up :  (Status: Goal Met.)  Short Term Goal Evaluation of current treatment plan related to  post surgical rupture of appendix / spleen , self-management and patient's adherence to plan as established by provider. Discussed plans with patient for ongoing care management follow up and provided patient with direct contact information for care management team Evaluation of current treatment plan related to post hospital / post surgical follow up and patient's adherence to plan as established by provider Reviewed medications with patient and discussed   Reviewed scheduled/upcoming provider appointments including Pharmacy referral for medication management Discussed plans with patient for ongoing care management follow up and provided patient with direct contact information for care management team   Patient Goals/Self-Care Activities: -Call pharmacy for medication refills 3-7 days in advance of running out of medications -  continue to take your medications as prescribed. Refill your prescriptions timely. - Weigh daily and record (notifying MD of 3 lb weight gain over night or 5 lb in a week) - Continue to follow a low salt diet ( re-discuss this with Dr. Lanora Manis at your next office visit for clarification)  - Continue to eat more whole grains, fruits and vegetables, lean meats and healthy fats - follow rescue plan for symptoms flare-up - Continue to keep your follow up appointments with your doctors.  - Continue to eat iron rich foods such as: lean meats and fish;  Kenly Xiao leafy vegetables, such as kale and spinach; Suha Schoenbeck beans, eggs; dried fruits, such as dates and figs; broccoli. Increase your fruits, vegetables, and water to help with constipation.  Inquire with your doctor or pharmacist about over the counter stool softener or mild laxative.   -  Continue to monitor for bleeding from nose, gums, in urine or stool to provider and report these symptoms to your doctor as soon as possible.         Plan:The patient has been provided with contact information for the care management team and has been advised to call with any health related questions or concerns.  The care management team will reach out to the patient again over the next 45 days. [name] RN,BSN,CCM RN Case Manager [place]  [phone]

## 2021-05-12 NOTE — Patient Instructions (Signed)
Visit Information  Thank you for taking time to visit with me today. Please don't hesitate to contact me if I can be of assistance to you before our next scheduled telephone appointment.  Following are the goals we discussed today:  -Call pharmacy for medication refills 3-7 days in advance of running out of medications -  continue to take your medications as prescribed. Refill your prescriptions timely. - Weigh daily and record (notifying MD of 3 lb weight gain over night or 5 lb in a week) - Continue to follow a low salt diet ( re-discuss this with Dr. Lanora Manis at your next office visit for clarification)  - Continue to eat more whole grains, fruits and vegetables, lean meats and healthy fats - follow rescue plan for symptoms flare-up - Continue to keep your follow up appointments with your doctors.  - Continue to eat iron rich foods such as: lean meats and fish; Kanai Hilger leafy vegetables, such as kale and spinach; Collene Massimino beans, eggs; dried fruits, such as dates and figs; broccoli. Increase your fruits, vegetables, and water to help with constipation.  Inquire with your doctor or pharmacist about over the counter stool softener or mild laxative.   -  Continue to monitor for bleeding from nose, gums, in urine or stool to provider and report these symptoms to your doctor as soon as possible.   Our next appointment is by telephone on 06/15/21 at 11:00 am  Please call the care guide team at [phone] if you need to cancel or reschedule your appointment.   If you are experiencing a Mental Health or [place] or need someone to talk to, please call the Suicide and Crisis Lifeline: 988 call 1-800-273-TALK (toll free, 24 hour hotline)   Patient verbalizes understanding of instructions and care plan provided today and agrees to view in [place]. Active MyChart status confirmed with patient.    [name] RN,BSN,CCM RN Case Manager [place]  [phone]

## 2021-05-17 DIAGNOSIS — M1711 Unilateral primary osteoarthritis, right knee: Secondary | ICD-10-CM | POA: Diagnosis not present

## 2021-05-22 ENCOUNTER — Other Ambulatory Visit: Payer: Self-pay | Admitting: Nurse Practitioner

## 2021-05-22 MED ORDER — APIXABAN 5 MG PO TABS: 5.0000 mg | ORAL_TABLET | Freq: Two times a day (BID) | ORAL | 1 refills | Status: DC

## 2021-05-23 DIAGNOSIS — I10 Essential (primary) hypertension: Secondary | ICD-10-CM

## 2021-05-23 DIAGNOSIS — I5022 Chronic systolic (congestive) heart failure: Secondary | ICD-10-CM

## 2021-05-24 NOTE — Telephone Encounter (Signed)
Patient advised. Patient states he does not have a history with Afib, DVT or PE that patient knows of. ?

## 2021-05-25 ENCOUNTER — Other Ambulatory Visit: Payer: Self-pay

## 2021-05-25 ENCOUNTER — Encounter: Payer: Self-pay | Admitting: Family

## 2021-05-25 ENCOUNTER — Ambulatory Visit: Payer: Medicare Other | Attending: Family | Admitting: Family

## 2021-05-25 ENCOUNTER — Telehealth: Payer: Self-pay

## 2021-05-25 VITALS — BP 134/88 | HR 101 | Resp 20 | Ht 76.0 in | Wt 183.0 lb

## 2021-05-25 DIAGNOSIS — D649 Anemia, unspecified: Secondary | ICD-10-CM | POA: Diagnosis not present

## 2021-05-25 DIAGNOSIS — F1010 Alcohol abuse, uncomplicated: Secondary | ICD-10-CM | POA: Diagnosis not present

## 2021-05-25 DIAGNOSIS — Z87891 Personal history of nicotine dependence: Secondary | ICD-10-CM | POA: Insufficient documentation

## 2021-05-25 DIAGNOSIS — M549 Dorsalgia, unspecified: Secondary | ICD-10-CM | POA: Diagnosis not present

## 2021-05-25 DIAGNOSIS — E119 Type 2 diabetes mellitus without complications: Secondary | ICD-10-CM | POA: Diagnosis not present

## 2021-05-25 DIAGNOSIS — I2510 Atherosclerotic heart disease of native coronary artery without angina pectoris: Secondary | ICD-10-CM | POA: Insufficient documentation

## 2021-05-25 DIAGNOSIS — M1990 Unspecified osteoarthritis, unspecified site: Secondary | ICD-10-CM | POA: Diagnosis not present

## 2021-05-25 DIAGNOSIS — I429 Cardiomyopathy, unspecified: Secondary | ICD-10-CM | POA: Insufficient documentation

## 2021-05-25 DIAGNOSIS — I5022 Chronic systolic (congestive) heart failure: Secondary | ICD-10-CM | POA: Insufficient documentation

## 2021-05-25 DIAGNOSIS — Z720 Tobacco use: Secondary | ICD-10-CM

## 2021-05-25 DIAGNOSIS — Z79899 Other long term (current) drug therapy: Secondary | ICD-10-CM | POA: Insufficient documentation

## 2021-05-25 DIAGNOSIS — I110 Hypertensive heart disease with heart failure: Secondary | ICD-10-CM | POA: Insufficient documentation

## 2021-05-25 DIAGNOSIS — J45909 Unspecified asthma, uncomplicated: Secondary | ICD-10-CM | POA: Diagnosis not present

## 2021-05-25 DIAGNOSIS — I10 Essential (primary) hypertension: Secondary | ICD-10-CM | POA: Diagnosis not present

## 2021-05-25 DIAGNOSIS — M109 Gout, unspecified: Secondary | ICD-10-CM | POA: Diagnosis not present

## 2021-05-25 DIAGNOSIS — R109 Unspecified abdominal pain: Secondary | ICD-10-CM | POA: Insufficient documentation

## 2021-05-25 DIAGNOSIS — F1091 Alcohol use, unspecified, in remission: Secondary | ICD-10-CM | POA: Diagnosis not present

## 2021-05-25 DIAGNOSIS — Z8719 Personal history of other diseases of the digestive system: Secondary | ICD-10-CM | POA: Diagnosis not present

## 2021-05-25 DIAGNOSIS — M25569 Pain in unspecified knee: Secondary | ICD-10-CM | POA: Diagnosis not present

## 2021-05-25 DIAGNOSIS — E785 Hyperlipidemia, unspecified: Secondary | ICD-10-CM | POA: Diagnosis not present

## 2021-05-25 MED ORDER — DAPAGLIFLOZIN PROPANEDIOL 10 MG PO TABS: 10.0000 mg | ORAL_TABLET | Freq: Every day | ORAL | 5 refills | Status: DC

## 2021-05-25 NOTE — Addendum Note (Signed)
Addended by: Kris Mouton on: 05/25/2021 03:56 PM ? ? Modules accepted: Orders ? ?

## 2021-05-25 NOTE — Telephone Encounter (Signed)
He can finish the bottle that he has and then stop. He will not need any more or refills on the eliquis. Sorry for the confusion  ?

## 2021-05-25 NOTE — Telephone Encounter (Signed)
Patient states that received call yesterday to stop eliquis.  ?He was seen by cardiology today and wanted to know if you wanted him to finish this month of eliquis. She also wanted to start in [place]. That was called in by Darylene Price FNP yesterday.  ? ?Patient is clarifying if he need to be on Eliquis or not.  ?

## 2021-05-25 NOTE — Telephone Encounter (Signed)
Patient advised. Patient has a bottle with 3 tablets and he will finish that and will not pick up any more from pharmacy. ?I called pharmacy but could not speak to someone but left them a message to let them know to take Eliquis from the system and that patient said he will come and get [place] [facility] and he has a coupon.  ?

## 2021-05-25 NOTE — Progress Notes (Signed)
Patient ID: Travis [name], male    DOB: May 16, 1955, 66 y.o.   MRN: [id number]  HPI  Travis [name] is a 66 y/o male with a history of osteoarthritis, hyperlipidemia, gout, HTN, GI bleed, DM, CAD, anemia, asthma, previous alcohol use, current tobacco use and chronic heart failure.  Echo report from 01/07/21 reviewed and showed an EF of 45-50% along with mild Travis. Echo report from 11/09/19 reviewed and showed an EF of 45-50% along with mild LVH. Echo report from 06/04/19 reviewed and showed an EF of 35-40% along with mild LVH. Reviewed last echo done 08/08/16 which showed an EF of 35-40%. This is an improvement from his last echo which was done 01/26/15 and showed an EF of 25-30% along with trivial Travis/ TR. EF had dropped from 2015 when it was 45-50%.   Had a cardiac catheterization done 01/28/15 which showed 10% stenosis in Prox RCA to Mid RCA and 30% stenosis in Prox LAD to Mid LAD which is not much different from previous angiography. Cardiomyopathy felt to be due to previous alcohol use.   Had a lengthy admission (39 days) back in October 2022 due to sepsis due to ruptured appendix and abdominal abscess. Spent time in rehab but has been back home for ~ the last month.   Travis [name] presents today for a follow-up visit with a chief complaint of minimal fatigue upon moderate exertion. Travis [name] describes this as chronic in nature having been present for several years. Travis [name] has associated abdominal pain (intermittent) & chronic orthopaedic pain along with this. Travis [name] denies any abdominal distention, palpitations, pedal edema, chest pain, wheezing, shortness of breath, cough, dizziness or weight gain.   Patient says that his PCP office called him yesterday and said that Travis [name] shouldn't be taking eliquis after Travis [name] finishes his current bottle and Travis [name] wanted to clarify. Explained that I couldn't see that conversation and that it looks like his PCP refilled 3 days ago. Explained that Travis [name] needed to call his PCP to clarify.   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 05/2019 Echo: EF 35-40%; b. 10/2019 Echo: EF 45-50%.   Allergy    Asthma    Central retinal artery occlusion of left eye 09/13/13   Chronic anemia    Clotting disorder ([place])    Diabetes mellitus, type 2 ([place])    pt reports his DM is gone   GI bleed    15 years ago   Gout    Hemoptysis    secondary to pulmonary edema   HFimpEF (heart failure with improved ejection fraction) ([place])    a. 12/2010 Echo: EF 20-25%; b 08/2013 Echo: EF 45-50%; c. 01/2015 Echo: EF 25-30%; d. 07/2016 Echo: EF 35-40%; e. 05/2019 Echo: EF 35-40%; d. 10/2019 Echo: EF 45-50%, mild LVH, mild red RV fxn, mildl dil RA, Triv Travis. Mild to mod Ao Sclerosis w/o stenosis.   Hyperlipidemia    Hypertension    Nonischemic cardiomyopathy (HCC)    Nonobstructive Coronary artery disease    a. 01/2015 Cath: LM nl, LAD 30p/m, LCX nl, RCA 10p/m, RPDA min irregs.   Osteoarthritis    Pneumonia    TIA (transient ischemic attack)    Tobacco abuse    Vision loss    peripherial vision only left eye.Central Retinal  artery occusion    Past Surgical History:  Procedure Laterality Date   BIOPSY  11/25/2019   Procedure: BIOPSY;  Surgeon: Fuller Plan,  Pricilla Riffle, MD;  Location: [facility] ENDOSCOPY;  Service: Endoscopy;;   CARDIAC CATHETERIZATION     CARDIAC CATHETERIZATION N/A 01/28/2015   Procedure: Left Heart Cath and Coronary Angiography;  Surgeon: Wellington Hampshire, MD;  Location: [place] CV LAB;  Service: Cardiovascular;  Laterality: N/A;   COLONOSCOPY     ESOPHAGOGASTRODUODENOSCOPY (EGD) WITH PROPOFOL N/A 11/25/2019   Procedure: ESOPHAGOGASTRODUODENOSCOPY (EGD) WITH PROPOFOL;  Surgeon: Ladene Artist, MD;  Location: [facility] ENDOSCOPY;  Service: Endoscopy;  Laterality: N/A;   HIP ARTHROPLASTY Right 03/15/2013   Procedure: ARTHROPLASTY BIPOLAR HIP;  Surgeon: [name], MD;  Location: [place];  Service: Orthopedics;  Laterality: Right;   LAPAROSCOPIC  APPENDECTOMY N/A 01/01/2021   Procedure: DIAGNOSTIC LAPAROSCOPY; EXPLORATORY LAPAROTOMY, ILEUCECECTOMY, DRAINAGE OF ABDOMINAL ABSCESS;  Surgeon: Georganna Skeans, MD;  Location: [place];  Service: General;  Laterality: N/A;   SPLENECTOMY, TOTAL N/A 01/06/2021   Procedure: SPLENECTOMY;  Surgeon: Jesusita Oka, MD;  Location: [place];  Service: General;  Laterality: N/A;   TOTAL SHOULDER ARTHROPLASTY Right 09/01/2015   Procedure: RIGHT TOTAL SHOULDER ARTHROPLASTY;  Surgeon: [name], MD;  Location: [place];  Service: Orthopedics;  Laterality: Right;   Family History  Problem Relation Age of Onset   Diabetes Mother    Hypertension Mother    Diabetes Father    Hypertension Father    Diabetes Sister    Dementia Brother    Cancer Neg Hx    Heart disease Neg Hx    Stroke Neg Hx    Colon cancer Neg Hx    Esophageal cancer Neg Hx    Rectal cancer Neg Hx    Stomach cancer Neg Hx    Social History   Tobacco Use   Smoking status: Former    Packs/day: 0.75    Years: 30.00    Pack years: 22.50    Types: Cigarettes   Smokeless tobacco: Never  Substance Use Topics   Alcohol use: Not Currently    Alcohol/week: 1.0 standard drink    Types: 1 Glasses of wine per week    Comment: occasionally   No Known Allergies Past Medical History:  Diagnosis Date   Alcohol abuse    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 05/2019 Echo: EF 35-40%; b. 10/2019 Echo: EF 45-50%.   Allergy    Asthma    Central retinal artery occlusion of left eye 09/13/13   Chronic anemia    Clotting disorder ([place])    Diabetes mellitus, type 2 ([place])    pt reports his DM is gone   GI bleed    15 years ago   Gout    Hemoptysis    secondary to pulmonary edema   HFimpEF (heart failure with improved ejection fraction) ([place])    a. 12/2010 Echo: EF 20-25%; b 08/2013 Echo: EF 45-50%; c. 01/2015 Echo: EF 25-30%; d. 07/2016 Echo: EF 35-40%; e. 05/2019 Echo: EF 35-40%; d. 10/2019 Echo: EF 45-50%, mild LVH, mild red RV fxn, mildl  dil RA, Triv Travis. Mild to mod Ao Sclerosis w/o stenosis.   Hyperlipidemia    Hypertension    Nonischemic cardiomyopathy (HCC)    Nonobstructive Coronary artery disease    a. 01/2015 Cath: LM nl, LAD 30p/m, LCX nl, RCA 10p/m, RPDA min irregs.   Osteoarthritis    Pneumonia    TIA (transient ischemic attack)    Tobacco abuse    Vision loss    peripherial vision only left eye.Central Retinal  artery occusion  Past Surgical History:  Procedure Laterality Date   BIOPSY  11/25/2019   Procedure: BIOPSY;  Surgeon: Ladene Artist, MD;  Location: [place];  Service: Endoscopy;;   CARDIAC CATHETERIZATION     CARDIAC CATHETERIZATION N/A 01/28/2015   Procedure: Left Heart Cath and Coronary Angiography;  Surgeon: Wellington Hampshire, MD;  Location: [place] CV LAB;  Service: Cardiovascular;  Laterality: N/A;   COLONOSCOPY     ESOPHAGOGASTRODUODENOSCOPY (EGD) WITH PROPOFOL N/A 11/25/2019   Procedure: ESOPHAGOGASTRODUODENOSCOPY (EGD) WITH PROPOFOL;  Surgeon: Ladene Artist, MD;  Location: [facility] ENDOSCOPY;  Service: Endoscopy;  Laterality: N/A;   HIP ARTHROPLASTY Right 03/15/2013   Procedure: ARTHROPLASTY BIPOLAR HIP;  Surgeon: [name], MD;  Location: [place];  Service: Orthopedics;  Laterality: Right;   LAPAROSCOPIC APPENDECTOMY N/A 01/01/2021   Procedure: DIAGNOSTIC LAPAROSCOPY; EXPLORATORY LAPAROTOMY, ILEUCECECTOMY, DRAINAGE OF ABDOMINAL ABSCESS;  Surgeon: Georganna Skeans, MD;  Location: [place];  Service: General;  Laterality: N/A;   SPLENECTOMY, TOTAL N/A 01/06/2021   Procedure: SPLENECTOMY;  Surgeon: Jesusita Oka, MD;  Location: [place];  Service: General;  Laterality: N/A;   TOTAL SHOULDER ARTHROPLASTY Right 09/01/2015   Procedure: RIGHT TOTAL SHOULDER ARTHROPLASTY;  Surgeon: [name], MD;  Location: [place];  Service: Orthopedics;  Laterality: Right;   Family History  Problem Relation Age of Onset   Diabetes Mother    Hypertension Mother    Diabetes Father    Hypertension  Father    Diabetes Sister    Dementia Brother    Cancer Neg Hx    Heart disease Neg Hx    Stroke Neg Hx    Colon cancer Neg Hx    Esophageal cancer Neg Hx    Rectal cancer Neg Hx    Stomach cancer Neg Hx    Social History   Tobacco Use   Smoking status: Former    Packs/day: 0.75    Years: 30.00    Pack years: 22.50    Types: Cigarettes   Smokeless tobacco: Never  Substance Use Topics   Alcohol use: Not Currently    Alcohol/week: 1.0 standard drink    Types: 1 Glasses of wine per week    Comment: occasionally   No Known Allergies  Prior to Admission medications   Medication Sig Start Date End Date Taking? Authorizing Provider  acetaminophen (TYLENOL) 500 MG tablet Take 2 tablets (1,000 mg total) by mouth every 8 (eight) hours as needed. 02/06/21  Yes Ghimire, Henreitta Leber, MD  albuterol (VENTOLIN HFA) 108 (90 Base) MCG/ACT inhaler Inhale 2 puffs into the lungs every 6 (six) hours as needed for wheezing or shortness of breath.   Yes [provider]  allopurinol (ZYLOPRIM) 100 MG tablet Take 100 mg by mouth 2 (two) times daily.   Yes [provider]  apixaban (ELIQUIS) 5 MG TABS tablet Take 1 tablet (5 mg total) by mouth 2 (two) times daily. 05/22/21  Yes [name], NP  atorvastatin (LIPITOR) 20 MG tablet Take 1 tablet (20 mg total) by mouth daily. 03/22/21  Yes [name], NP  Blood Pressure KIT Check b/p once daily. Based on insurance preference 03/03/21  Yes [name], NP  carvedilol (COREG) 6.25 MG tablet Take 6.25 mg by mouth 2 (two) times daily with a meal.   Yes [provider]  cetirizine (ZYRTEC) 10 MG tablet Take 10 mg by mouth daily.   Yes [provider]  fenofibrate (TRICOR) 145 MG tablet Take 145 mg by  mouth daily.   Yes [provider]  ferrous sulfate 325 (65 FE) MG tablet Take 1 tablet (325 mg total) by mouth daily with breakfast. 06/02/20  Yes Baity, Coralie Keens, NP  fluticasone (FLONASE) 50 MCG/ACT nasal spray  Place 1-2 sprays into both nostrils daily as needed for allergies or rhinitis.   Yes [provider]  hydroxypropyl methylcellulose / hypromellose (ISOPTO TEARS / GONIOVISC) 2.5 % ophthalmic solution Place 1 drop into both eyes 3 (three) times daily as needed for dry eyes.   Yes [provider]  magnesium oxide (MAG-OX) 400 MG tablet Take 1 tablet (400 mg total) by mouth daily. 12/19/20  Yes [name], NP  multivitamin (ONE-A-DAY MEN'S) TABS tablet Take 1 tablet by mouth daily with breakfast.   Yes [provider]  Omega-3 Fatty Acids (FISH OIL PO) Take 1 capsule by mouth daily.   Yes [provider]  pantoprazole (PROTONIX) 40 MG tablet Take 1 tablet (40 mg total) by mouth daily. 06/02/20  Yes Baity, Coralie Keens, NP  triamcinolone cream (KENALOG) 0.1 % APPLY TO AFFECTED AREAS TWICE A DAY AS NEEDED. AVOID FACE, GROIN, OR UNDERARMS. 11/01/20  Yes Kennyth Arnold, FNP     Review of Systems  Constitutional:  Positive for fatigue. Negative for appetite change.  HENT:  Negative for congestion, postnasal drip and sore throat.   Eyes: Negative.   Respiratory:  Negative for cough, chest tightness, shortness of breath and wheezing.   Cardiovascular:  Negative for chest pain, palpitations and leg swelling.  Gastrointestinal:  Positive for abdominal pain. Negative for abdominal distention.  Endocrine: Negative.   Genitourinary: Negative.   Musculoskeletal:  Positive for arthralgias (knee pain) and back pain.  Skin: Negative.   Allergic/Immunologic: Negative.   Neurological:  Negative for dizziness and light-headedness.  Hematological:  Negative for adenopathy. Does not bruise/bleed easily.  Psychiatric/Behavioral:  Negative for dysphoric mood and sleep disturbance (sleeping on 2 pillows). The patient is not nervous/anxious.    Vitals:   05/25/21 1156  BP: 134/88  Pulse: (!) 101  Resp: 20  SpO2: 98%  Weight: 183 lb (83 kg)  Height: '6\' 4"'  (1.93 m)   Wt Readings  from Last 3 Encounters:  05/25/21 183 lb (83 kg)  04/27/21 179 lb 6 oz (81.4 kg)  04/11/21 179 lb (81.2 kg)   Lab Results  Component Value Date   CREATININE 0.93 03/03/2021   CREATININE 1.08 02/08/2021   CREATININE 1.03 02/07/2021   Physical Exam Vitals and nursing note reviewed.  Constitutional:      Appearance: Normal appearance.  HENT:     Head: Normocephalic and atraumatic.  Cardiovascular:     Rate and Rhythm: Regular rhythm. Tachycardia present.  Pulmonary:     Effort: Pulmonary effort is normal. No respiratory distress.     Breath sounds: No wheezing or rales.  Abdominal:     General: There is no distension.     Palpations: Abdomen is soft.     Tenderness: There is no abdominal tenderness.  Musculoskeletal:        General: No tenderness.     Cervical back: Normal range of motion and neck supple.     Right lower leg: No edema.     Left lower leg: No edema.  Skin:    General: Skin is warm and dry.  Neurological:     General: No focal deficit present.     Mental Status: Travis [name] is alert and oriented to person, place, and time.  Psychiatric:        Mood and Affect: Mood normal.        Behavior: Behavior normal.        Thought Content: Thought content normal.     Assessment & Plan:  1: Chronic heart failure with reduced ejection fraction- - NYHA class II - euvolemic today - weighing daily; reminded to call for an overnight weight gain of >2 pounds/weekly weight gain of >5 pounds.  - weight up 4 pounds from last visit here 1 month ago - working on getting his strength back; has finished PT - trying to not add salt to his food  - Drinking approximately 64 oz of fluid daily - saw cardiology ([place]) 11/18/20; returns in 1 month and will ask if they can check BMET  - currently on GDMT of carvedilol  - will add farxiga 66m daily; 30 day voucher provided - discussed using entresto/MRA being mindful of BP as Travis [name]'s been hypotensive in the past - palliative care visit done  05/09/21 - BNP 01/23/21 was 73.6  2: HTN- - BP mildly elevated today (134/88) - BMP from 03/03/21 reviewed and shows sodium 136, potassium 4.39, creatinine 0.933 and GFR 86.39 - saw his PCP (Charmian Muff 04/07/21 - saw nephrology (Korrapati) 11/24/20  3: Tobacco use- - has not smoked anything since being released from his lengthy admission - congratulated him and continued cessation discussed  4: Alcohol use- - has not drank any alcohol since admission; says that that scared him - continued cessation discussed   Medication bottles reviewed.   Return in 2 months, sooner if needed.

## 2021-05-25 NOTE — Telephone Encounter (Signed)
Should patient finish out the Eliquis bottle he has at home or stop all together now? ?

## 2021-05-25 NOTE — Patient Instructions (Signed)
Continue weighing daily and call for an overnight weight gain of 3 pounds or more or a weekly weight gain of more than 5 pounds.  °

## 2021-06-01 DIAGNOSIS — M47816 Spondylosis without myelopathy or radiculopathy, lumbar region: Secondary | ICD-10-CM | POA: Diagnosis not present

## 2021-06-01 DIAGNOSIS — M545 Low back pain, unspecified: Secondary | ICD-10-CM | POA: Diagnosis not present

## 2021-06-15 ENCOUNTER — Ambulatory Visit (INDEPENDENT_AMBULATORY_CARE_PROVIDER_SITE_OTHER): Payer: Medicare Other

## 2021-06-15 DIAGNOSIS — I5022 Chronic systolic (congestive) heart failure: Secondary | ICD-10-CM

## 2021-06-15 DIAGNOSIS — D649 Anemia, unspecified: Secondary | ICD-10-CM

## 2021-06-15 NOTE — Chronic Care Management (AMB) (Signed)
?Chronic Care Management  ? ?CCM RN Visit Note ? ?06/15/2021 ?Name: Travis [name] MRN: [id number] DOB: 07/02/1955 ? ?Subjective: ?Travis [name] is a 66 y.o. year old male who is a primary care patient of [name], NP. The care management team was consulted for assistance with disease management and care coordination needs.   ? ?Engaged with patient by telephone for follow up visit in response to provider referral for case management and/or care coordination services.  ? ?Consent to Services:  ?The patient was given information about Chronic Care Management services, agreed to services, and gave verbal consent prior to initiation of services.  Please see initial visit note for detailed documentation.  ? ?Patient agreed to services and verbal consent obtained.  ? ?Assessment: Review of patient past medical history, allergies, medications, health status, including review of consultants reports, laboratory and other test data, was performed as part of comprehensive evaluation and provision of chronic care management services.  ? ?SDOH (Social Determinants of Health) assessments and interventions performed:   ? ?CCM Care Plan ? ?No Known Allergies ? ?Outpatient Encounter Medications as of 06/15/2021  ?Medication Sig  ? acetaminophen (TYLENOL) 500 MG tablet Take 2 tablets (1,000 mg total) by mouth every 8 (eight) hours as needed.  ? albuterol (VENTOLIN HFA) 108 (90 Base) MCG/ACT inhaler Inhale 2 puffs into the lungs every 6 (six) hours as needed for wheezing or shortness of breath.  ? allopurinol (ZYLOPRIM) 100 MG tablet Take 100 mg by mouth 2 (two) times daily.  ? atorvastatin (LIPITOR) 20 MG tablet Take 1 tablet (20 mg total) by mouth daily.  ? Blood Pressure KIT Check b/p once daily. Based on insurance preference  ? carvedilol (COREG) 6.25 MG tablet Take 6.25 mg by mouth 2 (two) times daily with a meal.  ? cetirizine (ZYRTEC) 10 MG tablet Take 10 mg by mouth daily.  ? dapagliflozin propanediol (FARXIGA) 10 MG TABS  tablet Take 1 tablet (10 mg total) by mouth daily before breakfast.  ? fenofibrate (TRICOR) 145 MG tablet Take 145 mg by mouth daily.  ? ferrous sulfate 325 (65 FE) MG tablet Take 1 tablet (325 mg total) by mouth daily with breakfast.  ? fluticasone (FLONASE) 50 MCG/ACT nasal spray Place 1-2 sprays into both nostrils daily as needed for allergies or rhinitis.  ? hydroxypropyl methylcellulose / hypromellose (ISOPTO TEARS / GONIOVISC) 2.5 % ophthalmic solution Place 1 drop into both eyes 3 (three) times daily as needed for dry eyes.  ? magnesium oxide (MAG-OX) 400 MG tablet Take 1 tablet (400 mg total) by mouth daily.  ? multivitamin (ONE-A-DAY MEN'S) TABS tablet Take 1 tablet by mouth daily with breakfast.  ? Omega-3 Fatty Acids (FISH OIL PO) Take 1 capsule by mouth daily.  ? pantoprazole (PROTONIX) 40 MG tablet Take 1 tablet (40 mg total) by mouth daily.  ? triamcinolone cream (KENALOG) 0.1 % APPLY TO AFFECTED AREAS TWICE A DAY AS NEEDED. AVOID FACE, GROIN, OR UNDERARMS.  ? ?No facility-administered encounter medications on file as of 06/15/2021.  ? ? ?Patient Active Problem List  ? Diagnosis Date Noted  ? Chronic pain of right knee 04/07/2021  ? Acute deep vein thrombosis (DVT) of brachial vein of left upper extremity ([place]) 03/03/2021  ? Malnutrition of moderate degree 01/05/2021  ? Ruptured appendicitis 01/01/2021  ? Sepsis ([place]) 01/01/2021  ? 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 Protein-calorie malnutrition, severe 05/27/2020  ? Hypotension 05/26/2020  ? AKI (acute kidney injury) ([place]) 05/26/2020  ? Alcohol use 11/20/2019  ?  CKD (chronic kidney disease), stage III ([place]) 11/20/2019  ? TIA (transient ischemic attack) 11/08/2019  ? Chronic bilateral low back pain without sciatica 06/26/2019  ? Osteoarthritis 05/09/2016  ? COPD (chronic obstructive pulmonary disease) ([place]) 05/09/2016  ? Tachycardia 05/23/2015  ? Essential hypertension 04/18/2015  ? Literacy level of illiterate 06/04/2014  ? CRA (central retinal artery  occlusion) 09/13/2013  ? Cardiomyopathy, nonischemic ([place]) 03/15/2013  ? Chronic systolic heart failure ([place]) 01/05/2011  ? Tobacco use 01/05/2011  ? Diabetes ([place]) 11/30/2008  ? HLD (hyperlipidemia) 11/30/2008  ? Gout 11/30/2008  ? Iron deficiency anemia 11/30/2008  ? ? ?Conditions to be addressed/monitored:CHF and Anemia ? ?Care Plan : RNCM plan of care  ?Updates made by Dannielle Karvonen, RN since 06/15/2021 12:00 AM  ?  ? ?Problem: Chronic disease management, education, and / or care coordination   ?Priority: High  ?  ? ?Long-Range Goal: Development of plan of care to address chronic disease management and / or care coordination needs   ?Start Date: 02/24/2021  ?Expected End Date: 07/21/2021  ?Priority: High  ?Note:   ?Current Barriers:  ?Knowledge Deficits related to plan of care for management of CHF and Anemia  ?Chronic Disease Management support and education needs related to CHF and Anemia ?Medication management:  Referral made to pharmacist ?Patient reports he is doing well. He states his fatigue and breathing is better. Patient reports he is walking a little over a mile daily.  Denies any falls. Patient reports having last visit with surgeon today.  He states he has recovered well from his surgery and denies any concerns.  Patient reports having follow up visit with his cardiologist on 05/25/21 and follow up with orthopedic doctor on 05/17/21.  Per chart review next follow up visit with primary care provider is 07/06/2021.  RNCM reviewed upcoming follow up appointments with patient and confirmed patient documented in his calendar.   Patient reports his blood pressure today was 128/80 and weight was 178 lbs. Denies any increase shortness of breath or extremity swelling.  ?RNCM Clinical Goal(s):  ?Patient will verbalize basic understanding of CHF and Anemia disease process and self health management plan as evidenced by patient report and/ or notation in chart ?take all medications exactly as prescribed and will  call provider for medication related questions as evidenced by patient report and/ or notation in chart    ?demonstrate understanding of rationale for each prescribed medication as evidenced by patient report and/ or notation in chart    ?attend all scheduled medical appointments:   as evidenced by patient report and/ or notation in chart        ?continue to work with Consulting civil engineer and/or Social Worker to address care management and care coordination needs related to CHF and anemia as evidenced by adherence to CM Team Scheduled appointments     ?work with pharmacist to address medication management related to CHF and anemia as evidenced by review of EMR and patient or pharmacist report    through collaboration with Consulting civil engineer, provider, and care team.  ? ?Interventions: ?1:1 collaboration with primary care provider regarding development and update of comprehensive plan of care as evidenced by provider attestation and co-signature ?Inter-disciplinary care team collaboration (see longitudinal plan of care) ?Evaluation of current treatment plan related to  self management and patient's adherence to plan as established by provider ? ? ?Anemia/Bleeding Interventions:  (Status:  Goal on track:  Yes.) Long Term Goal ? Medications reviewed and compliance discussed:  patient states he is no longer taking Eliquis ? Counseled on importance of regular laboratory monitoring as directed by provider ?Lab Results  ?Component Value Date  ? WBC 6.6 04/11/2021  ? HGB 12.5 (L) 04/11/2021  ? HCT 39.9 04/11/2021  ? MCV 96.1 04/11/2021  ? PLT 308 04/11/2021  ? ?Heart Failure Interventions:  (Status:  Goal on track:  Yes.) Long Term Goal ?Advised to follow low sodium diet ?Discussed importance of daily weight and advised patient to weigh and record daily ?Discussed the importance of keeping all appointments with provider ?Reviewed signs/ symptoms of heart failure. Advised to notify provider for a weight gain of 3 lbs overnight or 5  lbs in a week.  ?Discussed heart failure action plan/ zones. Advised to notify provider for mild/ moderate symptoms and call 911 for severe symptoms ?Medications reviewed and compliance discussed.  ? ?Post ho

## 2021-06-15 NOTE — Patient Instructions (Signed)
Visit Information ? ?Thank you for taking time to visit with me today. Please don't hesitate to contact me if I can be of assistance to you before our next scheduled telephone appointment. ? ?Following are the goals we discussed today:  ?-Call pharmacy for medication refills 3-7 days in advance of running out of medications ?-  continue to take your medications as prescribed. Refill your prescriptions timely. ?- Weigh daily and record (notifying MD of 3 lb weight gain over night or 5 lb in a week) ?- Continue to follow a low salt diet ( re-discuss this with Dr. Lanora Manis at your next office visit for clarification)  ?- Continue to eat more whole grains, fruits and vegetables, lean meats and healthy fats ?- follow rescue plan for symptoms flare-up ?- Continue to keep your follow up appointments with your doctors.  ?- Continue to eat iron rich foods such as: lean meats and fish; [place] leafy vegetables, such as kale and spinach; [place] beans, eggs; dried fruits, such as dates and figs; broccoli. Increase your fruits, vegetables, and water to help with constipation.  Inquire with your doctor or pharmacist about over the counter stool softener or mild laxative.   ? ?Our next appointment is by telephone on 07/11/21 at 9:30am ? ?Please call the care guide team at [phone] if you need to cancel or reschedule your appointment.  ? ?If you are experiencing a Mental Health or [place] or need someone to talk to, please call the Suicide and Crisis Lifeline: 988 ?call 1-800-273-TALK (toll free, 24 hour hotline)  ? ?Patient verbalizes understanding of instructions and care plan provided today and agrees to view in [place]. Active MyChart status confirmed with patient.   ? ?[name] RN,BSN,CCM ?RN Case Manager ?[place]  ?[phone] ? ?

## 2021-06-16 ENCOUNTER — Telehealth: Payer: Self-pay | Admitting: Nurse Practitioner

## 2021-06-16 NOTE — Telephone Encounter (Signed)
Pt had a spleen removal and his surgeon stated he needed shots - wants to know if the provider knew what shots were needed please advise  ?

## 2021-06-16 NOTE — Telephone Encounter (Signed)
[facility] surgery office and spoke with a nurse and let them know what vaccine information we have on file for the patient since they did not have this on file. Their office will call me back with feedback from this. I advised patient of this and that I am waiting on response ?

## 2021-06-16 NOTE — Telephone Encounter (Signed)
If we want to call the surgeons office and ask what immunizations she is saying he needs. He has had two doses of pneumococcal 23 and had his prevnar 13 in October. Not sure if she recommends him getting another pneumococcal 23 since the splenectomy, if so the guidelines state 5 years after last vaccination. Thanks. ? ?We can call the patient and let him know that we are checking on this matter for him ?

## 2021-06-20 LAB — HM DIABETES EYE EXAM

## 2021-06-20 NOTE — Telephone Encounter (Signed)
I called [facility] and provider has not responded to the message yet. ?

## 2021-06-23 DIAGNOSIS — I5022 Chronic systolic (congestive) heart failure: Secondary | ICD-10-CM

## 2021-06-23 DIAGNOSIS — D649 Anemia, unspecified: Secondary | ICD-10-CM

## 2021-06-23 NOTE — Telephone Encounter (Signed)
Left message for patient letting him know that I am still waiting for response from the surgeon office. ?

## 2021-06-26 NOTE — Progress Notes (Signed)
? ?Cardiology Office Note   ? ?Date:  06/27/2021  ? ?ID:  Travis [name], DOB 18-Apr-1955, MRN [id number] ? ?PCP:  [name], NP  ?Cardiologist:  Kathlyn Sacramento, MD  ?Electrophysiologist:  None  ? ?Chief Complaint: Follow-up ? ?History of Present Illness:  ? ?Travis [name] is a 66 y.o. male with history of CAD, alcoholic cardiomyopathy with HFrEF with an LVEF of 45 to 50%, LVH, trivial mitral regurgitation by echo in 10/2019, DM2, TIA, CKD stage III, chronic anemia with pancytopenia, HTN, HLD, gastric ulcer disease with prior GI bleed, and alcohol/tobacco use who presents for follow-up of his cardiomyopathy. ?  ?His cardiac history dates back to at least 2012 with chart indicating an echo at that time showed an EF of 20 to 25% with global hypokinesis.  Repeat echo in 2013 showed an EF of 50 to 55%.  Echo in 01/2015 showed an EF of 25 to 30% which was a decrease from prior echo in 2015 with an EF of 45 to 50%.  In 01/2015, he underwent diagnostic LHC which showed an occluded proximal RCA to mid RCA along with 30% stenosis of the proximal to mid LAD which was largely unchanged from prior angiography.  His cardiomyopathy was felt to be due to prior alcohol use.  Over the years he has had multiple hospital admissions which have included titration of medications related to hypotension and presyncope.  He was seen in the [facility] CHF clinic in 03/2020 and felt to be euvolemic with no medication changes being made.  He was seen in our office in early 05/2020 and was found to be hypotensive with BP 74/50 with complaints of dizziness.  He was sent to the ED for evaluation of symptomatic hypotension.  He reported a 15 pound weight loss since 02/2020 and had a poor appetite.  It was noted he had previously stopped drinking though had restarted more recently.  In the ED he was treated with IV fluids.  High-sensitivity troponin negative x2.  He was noted to have AKI with an initial serum creatinine 2.37 subsequently improving  with hydration.  Entresto and spironolactone were held in the setting of hypotension with AKI.  During his admission, his BP remained soft leading to the continued holding of Entresto, spironolactone, and beta-blocker.  At discharge, it was recommended carvedilol 3.125 mg twice daily be resumed.  He was seen in hospital follow-up on 06/03/2020 and reported he had been feeling well since his hospital discharge.  Upon reviewing his home medications it was noted he had been taking carvedilol 6.25 mg twice daily with continued soft BP at 110/80.  He had discontinued alcohol again and reported improved appetite.  Given borderline soft BP escalation of GDMT was deferred.  He was felt to be euvolemic.  [name] was subsequently resumed in follow-up.  He was seen in 06/2020 and was doing well from a cardiac perspective.  Primary cardiologist did not recommend resumption of spironolactone due to concerns for volume depletion.  Subsequent labs obtained in 09/2020 (which were recommended in the spring 2022) showed a decline in his renal function with a serum creatinine of 2.06 with baseline running around 1.2-1.5.  It was recommended Entresto be held at that time.  He was referred to nephrology. ? ?He was last seen in our office in 10/2020 and was doing well from a cardiac perspective, without symptoms of angina or decompensation.  Dizziness improved following the discontinuation of Entresto.  He was noted to be taking spironolactone.  He abstained from alcohol.  He smoked 1 pack/day and was not ready to quit.  Given frequent acute on CKD, spironolactone was again discontinued.  He was placed on losartan in place of Entresto secondary to significant dizziness associated with Entresto use. ? ?Since we last saw him, he had a lengthy hospitalization in 12/2020 (10/9 through 11/17) due to sepsis secondary to ruptured appendicitis status post exploratory laparotomy/ileocecectomy and drainage of intra-abdominal abscess with admission  complicated by hemorrhagic shock due to spontaneous splenic rupture status postsplenectomy as well as acute hypoxic respiratory failure with noted self extubation, acute left upper extremity DVT involving the left brachial vein, and pleural effusion status post ultrasound-guided thoracentesis with 600 cc of exudative fluid removed.  Echo was obtained during that admission and demonstrated an EF of 45 to 50%, global hypokinesis, normal LV diastolic function parameters, normal RV systolic function and ventricular cavity size, mild mitral regurgitation, mild aortic valve sclerosis without evidence of stenosis, and an estimated right atrial pressure of 3 mmHg. ?  ?He was started on Farxiga by the [facility] in early 05/2021. ? ?He comes in doing well from a cardiac perspective and is without symptoms of angina, dyspnea, palpitations, dizziness, presyncope, or syncope.  He does report a significant amount of weight loss during his hospitalization in the fall 2022.  He has been putting weight back on over the past several months due to caloric intake.  When compared to his last clinic visit in our office in 10/2020, his weight is down 5 pounds today.  He has completed anticoagulation therapy with apixaban for left upper extremity DVT and is no longer on anticoagulation.  No falls, hematochezia, or melena.  He is tolerating cardiac medications, though does note increased thirst following the addition of [place].  He is abstaining from tobacco use and alcohol.  He is not adding salt to his food and is drinking approximately 2 L of fluid per day.  Overall, he is feeling well and does not have any active issues or concerns outside of increased thirst following addition of SGLT2 inhibitor. ? ? ?Labs independently reviewed: ?03/2021 - Hgb 12.5, PLT 308 ?02/2021 - direct LDL 78, TC 137, TG 201, HDL 29, A1c 6.6, TSH normal, potassium 4.3, BUN 15, serum creatinine 0.93, albumin 3.5, AST/ALT normal ? ?Past Medical History:   ?Diagnosis Date  ? Alcohol abuse   ? Alcoholic cardiomyopathy ([place]) 03/15/2013  ? a. 05/2019 Echo: EF 35-40%; b. 10/2019 Echo: EF 45-50%.  ? Allergy   ? Asthma   ? 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 Chronic anemia   ? Clotting disorder ([place])   ? Diabetes mellitus, type 2 ([place])   ? pt reports his DM is gone  ? GI bleed   ? 15 years ago  ? Gout   ? Hemoptysis   ? secondary to pulmonary edema  ? HFimpEF (heart failure with improved ejection fraction) ([place])   ? a. 12/2010 Echo: EF 20-25%; b 08/2013 Echo: EF 45-50%; c. 01/2015 Echo: EF 25-30%; d. 07/2016 Echo: EF 35-40%; e. 05/2019 Echo: EF 35-40%; d. 10/2019 Echo: EF 45-50%, mild LVH, mild red RV fxn, mildl dil RA, Triv MR. Mild to mod Ao Sclerosis w/o stenosis.  ? Hyperlipidemia   ? Hypertension   ? Nonischemic cardiomyopathy ([place])   ? Nonobstructive Coronary artery disease   ? a. 01/2015 Cath: LM nl, LAD 30p/m, LCX nl, RCA 10p/m, RPDA min irregs.  ? Osteoarthritis   ? Pneumonia   ?  TIA (transient ischemic attack)   ? Tobacco abuse   ? Vision loss   ? peripherial vision only left eye.Central Retinal  artery occusion   ? ? ?Past Surgical History:  ?Procedure Laterality Date  ? BIOPSY  11/25/2019  ? Procedure: BIOPSY;  Surgeon: Ladene Artist, MD;  Location: [facility] ENDOSCOPY;  Service: Endoscopy;;  ? CARDIAC CATHETERIZATION    ? CARDIAC CATHETERIZATION N/A 01/28/2015  ? Procedure: Left Heart Cath and Coronary Angiography;  Surgeon: Wellington Hampshire, MD;  Location: [place] CV LAB;  Service: Cardiovascular;  Laterality: N/A;  ? COLONOSCOPY    ? ESOPHAGOGASTRODUODENOSCOPY (EGD) WITH PROPOFOL N/A 11/25/2019  ? Procedure: ESOPHAGOGASTRODUODENOSCOPY (EGD) WITH PROPOFOL;  Surgeon: Ladene Artist, MD;  Location: [facility] ENDOSCOPY;  Service: Endoscopy;  Laterality: N/A;  ? HIP ARTHROPLASTY Right 03/15/2013  ? Procedure: ARTHROPLASTY BIPOLAR HIP;  Surgeon: [name], MD;  Location: [place];  Service: Orthopedics;  Laterality: Right;  ? LAPAROSCOPIC  APPENDECTOMY N/A 01/01/2021  ? Procedure: DIAGNOSTIC LAPAROSCOPY; EXPLORATORY LAPAROTOMY, ILEUCECECTOMY, DRAINAGE OF ABDOMINAL ABSCESS;  Surgeon: Georganna Skeans, MD;  Location: [place];  Service: General;  Celedonio Miyamoto

## 2021-06-26 NOTE — Telephone Encounter (Signed)
Patient was advised I have not heard back from [place] office and I do not see any messages in the chart from their office since the original note was put in. Patient has a follow up with the surgeon on 07/13/21 and will follow up with them at that time if there is no feedback from them until then. ?Fyi to PCP ?

## 2021-06-27 ENCOUNTER — Ambulatory Visit (INDEPENDENT_AMBULATORY_CARE_PROVIDER_SITE_OTHER): Payer: Medicare Other | Admitting: Physician Assistant

## 2021-06-27 ENCOUNTER — Encounter: Payer: Self-pay | Admitting: Physician Assistant

## 2021-06-27 VITALS — BP 124/80 | HR 98 | Ht 76.0 in | Wt 184.0 lb

## 2021-06-27 DIAGNOSIS — Z8719 Personal history of other diseases of the digestive system: Secondary | ICD-10-CM

## 2021-06-27 DIAGNOSIS — D649 Anemia, unspecified: Secondary | ICD-10-CM

## 2021-06-27 DIAGNOSIS — I2510 Atherosclerotic heart disease of native coronary artery without angina pectoris: Secondary | ICD-10-CM | POA: Diagnosis not present

## 2021-06-27 DIAGNOSIS — I5022 Chronic systolic (congestive) heart failure: Secondary | ICD-10-CM | POA: Diagnosis not present

## 2021-06-27 DIAGNOSIS — Z720 Tobacco use: Secondary | ICD-10-CM

## 2021-06-27 DIAGNOSIS — I10 Essential (primary) hypertension: Secondary | ICD-10-CM | POA: Diagnosis not present

## 2021-06-27 DIAGNOSIS — N183 Chronic kidney disease, stage 3 unspecified: Secondary | ICD-10-CM

## 2021-06-27 DIAGNOSIS — J90 Pleural effusion, not elsewhere classified: Secondary | ICD-10-CM

## 2021-06-27 DIAGNOSIS — G459 Transient cerebral ischemic attack, unspecified: Secondary | ICD-10-CM

## 2021-06-27 DIAGNOSIS — I82A12 Acute embolism and thrombosis of left axillary vein: Secondary | ICD-10-CM

## 2021-06-27 DIAGNOSIS — I428 Other cardiomyopathies: Secondary | ICD-10-CM

## 2021-06-27 DIAGNOSIS — F1010 Alcohol abuse, uncomplicated: Secondary | ICD-10-CM

## 2021-06-27 NOTE — Patient Instructions (Signed)
Medication Instructions:  ?No changes at this time.  ? ?*If you need a refill on your cardiac medications before your next appointment, please call your pharmacy* ? ? ?Lab Work: ?BMP today ? ?If you have labs (blood work) drawn today and your tests are completely normal, you will receive your results only by: ?MyChart Message (if you have MyChart) OR ?A paper copy in the mail ?If you have any lab test that is abnormal or we need to change your treatment, we will call you to review the results. ? ? ?Testing/Procedures: ?None ? ? ?Follow-Up: ?At [facility], you and your health needs are our priority.  As part of our continuing mission to provide you with exceptional heart care, we have created designated Provider Care Teams.  These Care Teams include your primary Cardiologist (physician) and Advanced Practice Providers (APPs -  Physician Assistants and Nurse Practitioners) who all work together to provide you with the care you need, when you need it. ? ? ?Your next appointment:   ?3 month(s) ? ?The format for your next appointment:   ?In Person ? ?Provider:   ?Kathlyn Sacramento, MD or Christell Faith, PA-C ?

## 2021-06-28 ENCOUNTER — Telehealth: Payer: Self-pay

## 2021-06-28 DIAGNOSIS — I5022 Chronic systolic (congestive) heart failure: Secondary | ICD-10-CM

## 2021-06-28 LAB — BASIC METABOLIC PANEL
BUN/Creatinine Ratio: 9 — ABNORMAL LOW (ref 10–24)
BUN: 16 mg/dL (ref 8–27)
CO2: 20 mmol/L (ref 20–29)
Calcium: 10.7 mg/dL — ABNORMAL HIGH (ref 8.6–10.2)
Chloride: 101 mmol/L (ref 96–106)
Creatinine, Ser: 1.74 mg/dL — ABNORMAL HIGH (ref 0.76–1.27)
Glucose: 250 mg/dL — ABNORMAL HIGH (ref 70–99)
Potassium: 4.9 mmol/L (ref 3.5–5.2)
Sodium: 141 mmol/L (ref 134–144)
eGFR: 43 mL/min/{1.73_m2} — ABNORMAL LOW (ref >59)

## 2021-06-28 NOTE — Telephone Encounter (Signed)
Called patient and reviewed the result note as documented below. Patient verbalized understanding and [place] medical mall [place] one week on Wed. 07/05/21 for his lab work. ?

## 2021-06-28 NOTE — Telephone Encounter (Signed)
-----   Message from Rise Mu, PA-C sent at 06/28/2021  9:46 AM EDT ----- ?Random glucose is elevated ?Renal function is abnormal  ?Potassium high normal ?Calcium mildly elevated ? ?Recommendations: ?-Stop [place]ade ?-Follow up [facility] in 1 week, if calcium remains elevated at that time, recommend he follow up with his PCP ?

## 2021-07-05 ENCOUNTER — Other Ambulatory Visit
Admission: RE | Admit: 2021-07-05 | Discharge: 2021-07-05 | Disposition: A | Discharge status: Home / Self Care | Payer: Medicare Other | Source: Ambulatory Visit | Attending: Physician Assistant | Admitting: Physician Assistant

## 2021-07-05 DIAGNOSIS — I5022 Chronic systolic (congestive) heart failure: Secondary | ICD-10-CM | POA: Insufficient documentation

## 2021-07-05 DIAGNOSIS — K9189 Other postprocedural complications and disorders of digestive system: Secondary | ICD-10-CM

## 2021-07-05 LAB — BASIC METABOLIC PANEL
Anion gap: 7 (ref 5–15)
BUN: 20 mg/dL (ref 8–23)
CO2: 25 mmol/L (ref 22–32)
Calcium: 9.5 mg/dL (ref 8.9–10.3)
Chloride: 105 mmol/L (ref 98–111)
Creatinine, Ser: 1.22 mg/dL (ref 0.61–1.24)
GFR, Estimated: >60 mL/min (ref >60)
Glucose, Bld: 244 mg/dL — ABNORMAL HIGH (ref 70–99)
Potassium: 3.7 mmol/L (ref 3.5–5.1)
Sodium: 137 mmol/L (ref 135–145)

## 2021-07-06 ENCOUNTER — Ambulatory Visit: Payer: Commercial Managed Care - HMO | Admitting: Nurse Practitioner

## 2021-07-10 ENCOUNTER — Ambulatory Visit (INDEPENDENT_AMBULATORY_CARE_PROVIDER_SITE_OTHER): Payer: Medicare Other | Admitting: Nurse Practitioner

## 2021-07-10 ENCOUNTER — Encounter: Payer: Self-pay | Admitting: Nurse Practitioner

## 2021-07-10 VITALS — BP 138/80 | HR 94 | Ht 76.0 in | Wt 191.6 lb

## 2021-07-10 DIAGNOSIS — Z23 Encounter for immunization: Secondary | ICD-10-CM

## 2021-07-10 DIAGNOSIS — M545 Low back pain, unspecified: Secondary | ICD-10-CM

## 2021-07-10 DIAGNOSIS — Z9081 Acquired absence of spleen: Secondary | ICD-10-CM

## 2021-07-10 DIAGNOSIS — I10 Essential (primary) hypertension: Secondary | ICD-10-CM

## 2021-07-10 DIAGNOSIS — G8929 Other chronic pain: Secondary | ICD-10-CM

## 2021-07-10 DIAGNOSIS — E119 Type 2 diabetes mellitus without complications: Secondary | ICD-10-CM | POA: Diagnosis not present

## 2021-07-10 DIAGNOSIS — Q8901 Asplenia (congenital): Secondary | ICD-10-CM

## 2021-07-10 LAB — POCT GLYCOSYLATED HEMOGLOBIN (HGB A1C): Hemoglobin A1C: 8.8 % — AB (ref 4.0–5.6)

## 2021-07-10 LAB — CBC
HCT: 41.7 % (ref 39.0–52.0)
Hemoglobin: 13.9 g/dL (ref 13.0–17.0)
MCHC: 33.3 g/dL (ref 30.0–36.0)
MCV: 95.1 fl (ref 78.0–100.0)
Platelets: 326 10*3/uL (ref 150.0–400.0)
RBC: 4.39 Mil/uL (ref 4.22–5.81)
RDW: 16.2 % — ABNORMAL HIGH (ref 11.5–15.5)
WBC: 6.3 10*3/uL (ref 4.0–10.5)

## 2021-07-10 LAB — COMPREHENSIVE METABOLIC PANEL
ALT: 17 U/L (ref 0–53)
AST: 19 U/L (ref 0–37)
Albumin: 4.2 g/dL (ref 3.5–5.2)
Alkaline Phosphatase: 95 U/L (ref 39–117)
BUN: 14 mg/dL (ref 6–23)
CO2: 26 mEq/L (ref 19–32)
Calcium: 9.9 mg/dL (ref 8.4–10.5)
Chloride: 102 mEq/L (ref 96–112)
Creatinine, Ser: 1.36 mg/dL (ref 0.40–1.50)
GFR: 54.62 mL/min — ABNORMAL LOW (ref >60.00)
Glucose, Bld: 209 mg/dL — ABNORMAL HIGH (ref 70–99)
Potassium: 4.2 mEq/L (ref 3.5–5.1)
Sodium: 137 mEq/L (ref 135–145)
Total Bilirubin: 0.4 mg/dL (ref 0.2–1.2)
Total Protein: 7.2 g/dL (ref 6.0–8.3)

## 2021-07-10 LAB — LIPID PANEL
Cholesterol: 162 mg/dL (ref 0–200)
HDL: 34.3 mg/dL — ABNORMAL LOW (ref >39.00)
LDL Cholesterol: 89 mg/dL (ref 0–99)
NonHDL: 127.24
Total CHOL/HDL Ratio: 5
Triglycerides: 191 mg/dL — ABNORMAL HIGH (ref 0.0–149.0)
VLDL: 38.2 mg/dL (ref 0.0–40.0)

## 2021-07-10 MED ORDER — METFORMIN HCL 500 MG PO TABS: ORAL_TABLET | ORAL | 0 refills | Status: DC

## 2021-07-10 MED ORDER — TRAMADOL HCL 50 MG PO TABS: 50.0000 mg | ORAL_TABLET | Freq: Three times a day (TID) | ORAL | 0 refills | Status: AC | PRN

## 2021-07-10 NOTE — Progress Notes (Signed)
t ? ?Established Patient Office Visit ? ?Subjective:  ?Patient ID: Travis [name], male    DOB: 1955-07-11  Age: 66 y.o. MRN: [id number] ? ?CC:  ?Chief Complaint  ?Patient presents with  ? Follow-up  ?  3 month follow up   ? ? ?HPI ?Travis [name] presents for 3 month follow up  ? ?Did see the sugeron 06/15/2021 on and will see her this week and hopefully be cleared from their service. Surgeon did write a script for the patient on the vaccines that he was missing. HIB, meningitis and prevnar. He went and had PCV 20 at his pharmacy and was given meningitis and HIB in the hospital. Will give Men B as he is at higher risk.  ? ?Caridology: Has been doing well. They did take him off entreso and placed him on SGLT-2.  He had a decline in renal function to discontinue the medication.  Upon recheck he rebounded to normal. ? ? ?DM: will check sugar once ever 2-3 weeks. States he is normally in the higher 100s. Has managed his diabetes with diet in the past.  States when he is in the hospital I will give insulin. ? ?Eats big breakfast and snacks at lunch and will have a big dinner. Snacks between. Will drink water,sweet tea, sodas. ? ?Will do 2 mile walk a day ? ?Past Medical History:  ?Diagnosis Date  ? Alcohol abuse   ? Alcoholic cardiomyopathy ([place]) 03/15/2013  ? a. 05/2019 Echo: EF 35-40%; b. 10/2019 Echo: EF 45-50%.  ? Allergy   ? Asthma   ? 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 Chronic anemia   ? Clotting disorder ([place])   ? Diabetes mellitus, type 2 ([place])   ? pt reports his DM is gone  ? GI bleed   ? 15 years ago  ? Gout   ? Hemoptysis   ? secondary to pulmonary edema  ? HFimpEF (heart failure with improved ejection fraction) ([place])   ? a. 12/2010 Echo: EF 20-25%; b 08/2013 Echo: EF 45-50%; c. 01/2015 Echo: EF 25-30%; d. 07/2016 Echo: EF 35-40%; e. 05/2019 Echo: EF 35-40%; d. 10/2019 Echo: EF 45-50%, mild LVH, mild red RV fxn, mildl dil RA, Triv MR. Mild to mod Ao Sclerosis w/o stenosis.  ? Hyperlipidemia   ?  Hypertension   ? Nonischemic cardiomyopathy ([place])   ? Nonobstructive Coronary artery disease   ? a. 01/2015 Cath: LM nl, LAD 30p/m, LCX nl, RCA 10p/m, RPDA min irregs.  ? Osteoarthritis   ? Pneumonia   ? TIA (transient ischemic attack)   ? Tobacco abuse   ? Vision loss   ? peripherial vision only left eye.Central Retinal  artery occusion   ? ? ?Past Surgical History:  ?Procedure Laterality Date  ? BIOPSY  11/25/2019  ? Procedure: BIOPSY;  Surgeon: Ladene Artist, MD;  Location: [facility] ENDOSCOPY;  Service: Endoscopy;;  ? CARDIAC CATHETERIZATION    ? CARDIAC CATHETERIZATION N/A 01/28/2015  ? Procedure: Left Heart Cath and Coronary Angiography;  Surgeon: Wellington Hampshire, MD;  Location: [place] CV LAB;  Service: Cardiovascular;  Laterality: N/A;  ? COLONOSCOPY    ? ESOPHAGOGASTRODUODENOSCOPY (EGD) WITH PROPOFOL N/A 11/25/2019  ? Procedure: ESOPHAGOGASTRODUODENOSCOPY (EGD) WITH PROPOFOL;  Surgeon: Ladene Artist, MD;  Location: [facility] ENDOSCOPY;  Service: Endoscopy;  Laterality: N/A;  ? HIP ARTHROPLASTY Right 03/15/2013  ? Procedure: ARTHROPLASTY BIPOLAR HIP;  Surgeon: [name], MD;  Location: [place];  Service: Orthopedics;  Laterality: Right;  ? LAPAROSCOPIC APPENDECTOMY N/A 01/01/2021  ? Procedure: DIAGNOSTIC LAPAROSCOPY; EXPLORATORY LAPAROTOMY, ILEUCECECTOMY, DRAINAGE OF ABDOMINAL ABSCESS;  Surgeon: Georganna Skeans, MD;  Location: [place];  Service: General;  Laterality: N/A;  ? 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 Procedure: SPLENECTOMY;  Surgeon: Jesusita Oka, MD;  Location: [place];  Service: General;  Laterality: N/A;  ? TOTAL SHOULDER ARTHROPLASTY Right 09/01/2015  ? Procedure: RIGHT TOTAL SHOULDER ARTHROPLASTY;  Surgeon: [name], MD;  Location: [place];  Service: Orthopedics;  Laterality: Right;  ? ? ?Family History  ?Problem Relation Age of Onset  ? Diabetes Mother   ? Hypertension Mother   ? Diabetes Father   ? Hypertension Father   ? Diabetes Sister   ? Dementia Brother   ? Cancer Neg Hx   ? Heart  disease Neg Hx   ? Stroke Neg Hx   ? Colon cancer Neg Hx   ? Esophageal cancer Neg Hx   ? Rectal cancer Neg Hx   ? Stomach cancer Neg Hx   ? ? ?Social History  ? ?Socioeconomic History  ? Marital status: Single  ?  Spouse name: Not on file  ? Number of children: 0  ? Years of education: 45  ? Highest education level: High school graduate  ?Occupational History  ? Occupation: Retired  ?Tobacco Use  ? Smoking status: Former  ?  Packs/day: 0.75  ?  Years: 30.00  ?  Pack years: 22.50  ?  Types: Cigarettes  ? Smokeless tobacco: Never  ?Vaping Use  ? Vaping Use: Never used  ?Substance and Sexual Activity  ? Alcohol use: Not Currently  ?  Alcohol/week: 1.0 standard drink  ?  Types: 1 Glasses of wine per week  ?  Comment: occasionally  ? Drug use: Not Currently  ?  Types: Marijuana  ? Sexual activity: Yes  ?  Birth control/protection: Condom  ?Other Topics Concern  ? Not on file  ?Social History Narrative  ? Not on file  ? ?Social Determinants of Health  ? ?Financial Resource Strain: Not on file  ?Food Insecurity: Not on file  ?Transportation Needs: Not on file  ?Physical Activity: Not on file  ?Stress: Not on file  ?Social Connections: Not on file  ?Intimate Partner Violence: Not on file  ? ? ?Outpatient Medications Prior to Visit  ?Medication Sig Dispense Refill  ? acetaminophen (TYLENOL) 500 MG tablet Take 2 tablets (1,000 mg total) by mouth every 8 (eight) hours as needed. 30 tablet 0  ? albuterol (VENTOLIN HFA) 108 (90 Base) MCG/ACT inhaler Inhale 2 puffs into the lungs every 6 (six) hours as needed for wheezing or shortness of breath.    ? allopurinol (ZYLOPRIM) 100 MG tablet Take 100 mg by mouth 2 (two) times daily.    ? atorvastatin (LIPITOR) 20 MG tablet Take 1 tablet (20 mg total) by mouth daily. 90 tablet 1  ? Blood Pressure KIT Check b/p once daily. Based on insurance preference 1 kit 0  ? carvedilol (COREG) 6.25 MG tablet Take 6.25 mg by mouth 2 (two) times daily with a meal.    ? cetirizine (ZYRTEC) 10 MG  tablet Take 10 mg by mouth daily.    ? fenofibrate (TRICOR) 145 MG tablet Take 145 mg by mouth daily.    ? ferrous sulfate 325 (65 FE) MG tablet Take 1 tablet (325 mg total) by mouth daily with breakfast. 90 tablet 2  ? fluticasone (FLONASE) 50 MCG/ACT nasal spray Place 1-2 sprays  into both nostrils daily as needed for allergies or rhinitis.    ? hydroxypropyl methylcellulose / hypromellose (ISOPTO TEARS / GONIOVISC) 2.5 % ophthalmic solution Place 1 drop into both eyes 3 (three) times daily as needed for dry eyes.    ? magnesium oxide (MAG-OX) 400 MG tablet Take 1 tablet (400 mg total) by mouth daily. 90 tablet 0  ? multivitamin (ONE-A-DAY MEN'S) TABS tablet Take 1 tablet by mouth daily with breakfast.    ? Omega-3 Fatty Acids (FISH OIL PO) Take 1 capsule by mouth daily.    ? pantoprazole (PROTONIX) 40 MG tablet Take 1 tablet (40 mg total) by mouth daily. 90 tablet 2  ? triamcinolone cream (KENALOG) 0.1 % APPLY TO AFFECTED AREAS TWICE A DAY AS NEEDED. AVOID FACE, GROIN, OR UNDERARMS. 454 g 1  ? ?No facility-administered medications prior to visit.  ? ? ?No Known Allergies ? ?ROS ?Review of Systems  ?Constitutional:  Negative for chills and fever.  ?Cardiovascular:  Negative for chest pain.  ?Gastrointestinal:  Positive for abdominal pain. Negative for diarrhea, nausea and vomiting.  ?Musculoskeletal:  Positive for back pain.  ?Hematological:  Does not bruise/bleed easily.  ? ?  ?Objective:  ?  ?Physical Exam ?Vitals and nursing note reviewed.  ?Constitutional:   ?   Appearance: Normal appearance.  ?Cardiovascular:  ?   Rate and Rhythm: Normal rate and regular rhythm.  ?   Heart sounds: Normal heart sounds.  ?Pulmonary:  ?   Effort: Pulmonary effort is normal.  ?   Breath sounds: Normal breath sounds.  ?Abdominal:  ?   General: Bowel sounds are normal. There is no distension.  ?   Palpations: There is no mass.  ?   Tenderness: There is abdominal tenderness.  ?   Hernia: No hernia is present.  ?Musculoskeletal:  ?    Right lower leg: No edema.  ?   Left lower leg: No edema.  ?Skin: ?   General: Skin is warm.  ?Neurological:  ?   General: No focal deficit present.  ?   Mental Status: He is alert.  ?   Comments: Bilateral

## 2021-07-10 NOTE — Assessment & Plan Note (Signed)
Post splenectomy.  He is followed by Dr. Bobbye Morton through [facility] surgery.  Last saw 06/15/2021.  States he has an appointment coming up this week.  Hopefully be cleared from her service.  We did find out which vaccines the surgeon would like patient to have.  He had Prevnar 20 administered at his pharmacy.  Meningitis and HIV administered in hospital stay.  Patient given men B in office today.  We will get second dose of men B at next office visit ?

## 2021-07-10 NOTE — Assessment & Plan Note (Signed)
Patient currently maintained on carvedilol 3.125 mg twice daily limits in office today.  He is followed by cardiology ?

## 2021-07-10 NOTE — Assessment & Plan Note (Signed)
A1c 8.8 today.  Patient is trying to manage diabetes with diet in the past.  We did review nutrition and diabetes today.  We will place patient on metformin 500 mg twice daily recheck in 3 months. ?

## 2021-07-10 NOTE — Patient Instructions (Signed)
Nice to see you today ?I will be in touch with the lab results ?You have already had the menvo in the hospital along with the HIB ?Follow up with me in 3 months, sooner if needed ?

## 2021-07-10 NOTE — Assessment & Plan Note (Signed)
Has been evaluated by [facility] clinic seeing them twice.  Patient did request to have some tramadol as needed.  Last fill was in January 2023 for 15 tablets.  Refill tramadol today ?

## 2021-07-11 ENCOUNTER — Ambulatory Visit (INDEPENDENT_AMBULATORY_CARE_PROVIDER_SITE_OTHER): Payer: Medicare Other

## 2021-07-11 DIAGNOSIS — E119 Type 2 diabetes mellitus without complications: Secondary | ICD-10-CM

## 2021-07-11 DIAGNOSIS — D649 Anemia, unspecified: Secondary | ICD-10-CM

## 2021-07-11 DIAGNOSIS — I5022 Chronic systolic (congestive) heart failure: Secondary | ICD-10-CM

## 2021-07-11 NOTE — Patient Instructions (Signed)
Visit Information ? ?Thank you for taking time to visit with me today. Please don't hesitate to contact me if I can be of assistance to you before our next scheduled telephone appointment. ? ?Following are the goals we discussed today:  ?-Call pharmacy for medication refills 3-7 days in advance of running out of medications ?-  continue to take your medications as prescribed. Refill your prescriptions timely. ?- Weigh daily and record (notifying MD of 3 lb weight gain over night or 5 lb in a week) ?- Continue to follow a low salt diet ( re-discuss this with Dr. Lanora Manis at your next office visit for clarification)  ?- Continue to eat more whole grains, fruits and vegetables, lean meats and healthy fats ?- Continue to keep your follow up appointments with your doctors.  ?- Continue to monitor blood sugars 2-3 times per week.  ? ?Our next appointment is by telephone on 09/14/21 at 9:30am ? ?Please call the care guide team at [phone] if you need to cancel or reschedule your appointment.  ? ?If you are experiencing a Mental Health or [place] or need someone to talk to, please call the Suicide and Crisis Lifeline: 988 ?call 1-800-273-TALK (toll free, 24 hour hotline)  ? ?Patient verbalizes understanding of instructions and care plan provided today and agrees to view in [place]. Active MyChart status confirmed with patient.   ? ?[name] RN,BSN,CCM ?RN Case Manager ?[place]  ?[phone] ? ?

## 2021-07-11 NOTE — Chronic Care Management (AMB) (Signed)
?Chronic Care Management  ? ?CCM RN Visit Note ? ?07/11/2021 ?Name: Travis [name] MRN: [id number] DOB: 1955/11/29 ? ?Subjective: ?Travis [name] is a 66 y.o. year old male who is a primary care patient of [name], NP. The care management team was consulted for assistance with disease management and care coordination needs.   ? ?Engaged with patient by telephone for follow up visit in response to provider referral for case management and/or care coordination services.  ? ?Consent to Services:  ?The patient was given information about Chronic Care Management services, agreed to services, and gave verbal consent prior to initiation of services.  Please see initial visit note for detailed documentation.  ? ?Patient agreed to services and verbal consent obtained.  ? ?Assessment: Review of patient past medical history, allergies, medications, health status, including review of consultants reports, laboratory and other test data, was performed as part of comprehensive evaluation and provision of chronic care management services.  ? ?SDOH (Social Determinants of Health) assessments and interventions performed:   ? ?CCM Care Plan ? ?No Known Allergies ? ?Outpatient Encounter Medications as of 07/11/2021  ?Medication Sig  ? acetaminophen (TYLENOL) 500 MG tablet Take 2 tablets (1,000 mg total) by mouth every 8 (eight) hours as needed.  ? albuterol (VENTOLIN HFA) 108 (90 Base) MCG/ACT inhaler Inhale 2 puffs into the lungs every 6 (six) hours as needed for wheezing or shortness of breath.  ? allopurinol (ZYLOPRIM) 100 MG tablet Take 100 mg by mouth 2 (two) times daily.  ? atorvastatin (LIPITOR) 20 MG tablet Take 1 tablet (20 mg total) by mouth daily.  ? Blood Pressure KIT Check b/p once daily. Based on insurance preference  ? carvedilol (COREG) 6.25 MG tablet Take 6.25 mg by mouth 2 (two) times daily with a meal.  ? cetirizine (ZYRTEC) 10 MG tablet Take 10 mg by mouth daily.  ? fenofibrate (TRICOR) 145 MG tablet Take 145 mg  by mouth daily.  ? ferrous sulfate 325 (65 FE) MG tablet Take 1 tablet (325 mg total) by mouth daily with breakfast.  ? fluticasone (FLONASE) 50 MCG/ACT nasal spray Place 1-2 sprays into both nostrils daily as needed for allergies or rhinitis.  ? hydroxypropyl methylcellulose / hypromellose (ISOPTO TEARS / GONIOVISC) 2.5 % ophthalmic solution Place 1 drop into both eyes 3 (three) times daily as needed for dry eyes.  ? magnesium oxide (MAG-OX) 400 MG tablet Take 1 tablet (400 mg total) by mouth daily.  ? metFORMIN (GLUCOPHAGE) 500 MG tablet 1 tablet by mouth every morning with food for 7 days. Then 1 tab BID with food after that  ? multivitamin (ONE-A-DAY MEN'S) TABS tablet Take 1 tablet by mouth daily with breakfast.  ? Omega-3 Fatty Acids (FISH OIL PO) Take 1 capsule by mouth daily.  ? pantoprazole (PROTONIX) 40 MG tablet Take 1 tablet (40 mg total) by mouth daily.  ? traMADol (ULTRAM) 50 MG tablet Take 1 tablet (50 mg total) by mouth every 8 (eight) hours as needed for up to 5 days.  ? triamcinolone cream (KENALOG) 0.1 % APPLY TO AFFECTED AREAS TWICE A DAY AS NEEDED. AVOID FACE, GROIN, OR UNDERARMS.  ? ?No facility-administered encounter medications on file as of 07/11/2021.  ? ? ?Patient Active Problem List  ? Diagnosis Date Noted  ? Ileus, postoperative ([place])   ? Chronic pain of right knee 04/07/2021  ? Acute deep vein thrombosis (DVT) of brachial vein of left upper extremity ([place]) 03/03/2021  ? Malnutrition of moderate degree  01/05/2021  ? Ruptured appendicitis 01/01/2021  ? Sepsis ([place]) 01/01/2021  ? 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 Protein-calorie malnutrition, severe 05/27/2020  ? Hypotension 05/26/2020  ? AKI (acute kidney injury) ([place]) 05/26/2020  ? Alcohol use 11/20/2019  ? CKD (chronic kidney disease), stage III ([place]) 11/20/2019  ? TIA (transient ischemic attack) 11/08/2019  ? Chronic bilateral low back pain without sciatica 06/26/2019  ? Osteoarthritis 05/09/2016  ? COPD (chronic obstructive pulmonary  disease) ([place]) 05/09/2016  ? Tachycardia 05/23/2015  ? Essential hypertension 04/18/2015  ? Literacy level of illiterate 06/04/2014  ? 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  ? Cardiomyopathy, nonischemic ([place]) 03/15/2013  ? Chronic systolic heart failure ([place]) 01/05/2011  ? Tobacco use 01/05/2011  ? Diabetes ([place]) 11/30/2008  ? HLD (hyperlipidemia) 11/30/2008  ? Gout 11/30/2008  ? Iron deficiency anemia 11/30/2008  ? ? ?Conditions to be addressed/monitored:CHF, DMII, and Anemia ? ?Care Plan : RNCM plan of care  ?Updates made by Dannielle Karvonen, RN since 07/11/2021 12:00 AM  ?  ? ?Problem: Chronic disease management, education, and / or care coordination   ?Priority: High  ?  ? ?Long-Range Goal: Development of plan of care to address chronic disease management and / or care coordination needs   ?Start Date: 02/24/2021  ?Expected End Date: 09/22/2021  ?Priority: High  ?Note:   ?Current Barriers:  ?Knowledge Deficits related to plan of care for management of CHF and Anemia  ?Chronic Disease Management support and education needs related to CHF and Anemia ?Medication management:  Referral made to pharmacist ?Patient reports he is doing well. Reports having follow up with cardiologist on 06/27/21.  He states his doctor discontinued his [place].  Patient states he continues to abstain from ETOH and smoking. He states he saw his primary care provider on 07/10/21.  Continues to check his blood sugar 2-3 times per week.  Patient states his primary provider started him on Metformin.  Most recent Hgb A1c 8.8. He states his next follow up with the surgeon is 07/13/21.   Denies any heart failure symptoms at present.  ?RNCM Clinical Goal(s):  ?Patient will verbalize basic understanding of CHF and Anemia disease process and self health management plan as evidenced by patient report and/ or notation in chart ?take all medications exactly as prescribed and will call provider for medication related questions as evidenced by  patient report and/ or notation in chart    ?demonstrate understanding of rationale for each prescribed medication as evidenced by patient report and/ or notation in chart    ?attend all scheduled medical appointments:   as evidenced by patient report and/ or notation in chart        ?continue to work with Consulting civil engineer and/or Social Worker to address care management and care coordination needs related to CHF and anemia as evidenced by adherence to CM Team Scheduled appointments     ?work with pharmacist to address medication management related to CHF and anemia as evidenced by review of EMR and patient or pharmacist report    through collaboration with Consulting civil engineer, provider, and care team.  ? ?Interventions: ?1:1 collaboration with primary care provider regarding development and update of comprehensive plan of care as evidenced by provider attestation and co-signature ?Inter-disciplinary care team collaboration (see longitudinal plan of care) ?Evaluation of current treatment plan related to  self management and patient's adherence to plan as established by provider ? ? ?Anemia/Bleeding Interventions:  (Status:  Goal on track:  Yes.) Long Term Goal ?  Medications reviewed and compliance discussed ? Counseled on importance of regular laboratory monitoring as directed by provider ?Lab Results  ?Component Value Date  ? WBC 6.3 07/10/2021  ? HGB 13.9 07/10/2021  ? HCT 41.7 07/10/2021  ? MCV 95.1 07/10/2021  ? PLT 326.0 07/10/2021  ? ?Heart Failure Interventions:  (Status:  Goal on track:  Yes.) Long Term Goal ?Advised to follow low sodium diet ?Encouraged to weigh daily and record ?Discussed the importance of keeping all appointments with provider ?Reviewed signs/ symptoms of heart failure. Advised to notify provider for a weight gain of 3 lbs overnight or 5 lbs in a week.  ?Discussed heart failure action plan/ zones. Advised to notify provider for mild/ moderate symptoms and call 911 for severe  symptoms ?Medications reviewed and compliance discussed.  ? ?Diabetes Interventions: ( status: New Goal) Long term ?Medications reviewed and compliance discussed ?Advised to check blood sugars as recommended by provider ?Advise

## 2021-07-18 ENCOUNTER — Other Ambulatory Visit: Payer: Self-pay | Admitting: Internal Medicine

## 2021-07-20 NOTE — Telephone Encounter (Signed)
Name of Medication: pantoprazole 40 mg ?Name of Pharmacy: [facility] ?Last Fill or Written Date and Quantity: # 90 x 2 on 06/02/20 ?Last Office Visit and Type: 07/03/2021 FU ?Next Office Visit and Type: none scheduled ? ? ? ?

## 2021-07-20 NOTE — Telephone Encounter (Signed)
Pt of [facility] 

## 2021-07-23 DIAGNOSIS — D649 Anemia, unspecified: Secondary | ICD-10-CM

## 2021-07-23 DIAGNOSIS — I5022 Chronic systolic (congestive) heart failure: Secondary | ICD-10-CM

## 2021-07-23 DIAGNOSIS — E119 Type 2 diabetes mellitus without complications: Secondary | ICD-10-CM

## 2021-07-25 ENCOUNTER — Ambulatory Visit: Payer: Medicare Other | Attending: Family | Admitting: Family

## 2021-07-25 ENCOUNTER — Encounter: Payer: Self-pay | Admitting: Family

## 2021-07-25 VITALS — BP 142/89 | HR 90 | Resp 16 | Ht 76.0 in | Wt 190.0 lb

## 2021-07-25 DIAGNOSIS — F1010 Alcohol abuse, uncomplicated: Secondary | ICD-10-CM | POA: Diagnosis not present

## 2021-07-25 DIAGNOSIS — N1831 Chronic kidney disease, stage 3a: Secondary | ICD-10-CM

## 2021-07-25 DIAGNOSIS — I5022 Chronic systolic (congestive) heart failure: Secondary | ICD-10-CM | POA: Diagnosis not present

## 2021-07-25 DIAGNOSIS — E1122 Type 2 diabetes mellitus with diabetic chronic kidney disease: Secondary | ICD-10-CM | POA: Diagnosis not present

## 2021-07-25 DIAGNOSIS — Z7901 Long term (current) use of anticoagulants: Secondary | ICD-10-CM | POA: Diagnosis not present

## 2021-07-25 DIAGNOSIS — J45909 Unspecified asthma, uncomplicated: Secondary | ICD-10-CM | POA: Insufficient documentation

## 2021-07-25 DIAGNOSIS — I130 Hypertensive heart and chronic kidney disease with heart failure and stage 1 through stage 4 chronic kidney disease, or unspecified chronic kidney disease: Secondary | ICD-10-CM | POA: Diagnosis not present

## 2021-07-25 DIAGNOSIS — E785 Hyperlipidemia, unspecified: Secondary | ICD-10-CM | POA: Insufficient documentation

## 2021-07-25 DIAGNOSIS — Z720 Tobacco use: Secondary | ICD-10-CM | POA: Diagnosis not present

## 2021-07-25 DIAGNOSIS — I2510 Atherosclerotic heart disease of native coronary artery without angina pectoris: Secondary | ICD-10-CM | POA: Diagnosis not present

## 2021-07-25 DIAGNOSIS — M109 Gout, unspecified: Secondary | ICD-10-CM | POA: Diagnosis not present

## 2021-07-25 DIAGNOSIS — I10 Essential (primary) hypertension: Secondary | ICD-10-CM | POA: Diagnosis not present

## 2021-07-25 LAB — GLUCOSE, CAPILLARY: Glucose-Capillary: 173 mg/dL — ABNORMAL HIGH (ref 70–99)

## 2021-07-25 NOTE — Patient Instructions (Addendum)
Resume weighing daily and call for an overnight weight gain of 3 pounds or more or a weekly weight gain of more than 5 pounds. ? ? ?If you have voicemail, please make sure your mailbox is cleaned out so that we may leave a message and please make sure to listen to any voicemails.  ? ? ?Bring ALL medications including any over the counter or vitamins to every visit.  ? ? ?Check blood pressure and glucose levels a few times a week.  ? ? ? ? ?

## 2021-07-25 NOTE — Progress Notes (Signed)
? Patient ID: Travis [name], male    DOB: [birth year], 66 y.o.   MRN: [id number] ? ?HPI ? ?Mr Amico is a 66 y/o male with a history of osteoarthritis, hyperlipidemia, gout, HTN, GI bleed, DM, CAD, anemia, asthma, previous alcohol use, current tobacco use and chronic heart failure. ? ?Echo report from 01/07/21 reviewed and showed an EF of 45-50% along with mild MR. Echo report from 11/09/19 reviewed and showed an EF of 45-50% along with mild LVH. Echo report from 06/04/19 reviewed and showed an EF of 35-40% along with mild LVH. Reviewed last echo done 08/08/16 which showed an EF of 35-40%. This is an improvement from his last echo which was done 01/26/15 and showed an EF of 25-30% along with trivial MR/ TR. EF had dropped from 2015 when it was 45-50%.  ? ?Had a cardiac catheterization done 01/28/15 which showed 10% stenosis in Prox RCA to Mid RCA and 30% stenosis in Prox LAD to Mid LAD which is not much different from previous angiography. Cardiomyopathy felt to be due to previous alcohol use.  ? ?Had a lengthy admission (39 days) back in October 2022 due to sepsis due to ruptured appendix and abdominal abscess.  ? ?He presents today with a chief complaint of a follow-up visit. Currently without any complaints and specifically denies any difficulty sleeping, dizziness, abdominal distention, palpitations, pedal edema, chest pain, shortness of breath, cough or fatigue.  ? ?Not weighing daily but is trying to eat better and put back on some of the weight he lost with lengthy admission last fall.  ? ?Doesn't check his BP or his glucose daily. Last glucose check was ~ 1 week ago. Ate a sausage/egg/cheese biscuit with hasbrown this morning for breakfast.  ? ?Past Medical History:  ?Diagnosis Date  ? Alcohol abuse   ? 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 a. 05/2019 Echo: EF 35-40%; b. 10/2019 Echo: EF 45-50%.  ? Allergy   ? Asthma   ? 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 Chronic anemia   ? Clotting  disorder ([place])   ? Diabetes mellitus, type 2 ([place])   ? pt reports his DM is gone  ? GI bleed   ? 15 years ago  ? Gout   ? Hemoptysis   ? secondary to pulmonary edema  ? HFimpEF (heart failure with improved ejection fraction) ([place])   ? a. 12/2010 Echo: EF 20-25%; b 08/2013 Echo: EF 45-50%; c. 01/2015 Echo: EF 25-30%; d. 07/2016 Echo: EF 35-40%; e. 05/2019 Echo: EF 35-40%; d. 10/2019 Echo: EF 45-50%, mild LVH, mild red RV fxn, mildl dil RA, Triv MR. Mild to mod Ao Sclerosis w/o stenosis.  ? Hyperlipidemia   ? Hypertension   ? Nonischemic cardiomyopathy ([place])   ? Nonobstructive Coronary artery disease   ? a. 01/2015 Cath: LM nl, LAD 30p/m, LCX nl, RCA 10p/m, RPDA min irregs.  ? Osteoarthritis   ? Pneumonia   ? TIA (transient ischemic attack)   ? Tobacco abuse   ? Vision loss   ? peripherial vision only left eye.Central Retinal  artery occusion   ? ?Past Surgical History:  ?Procedure Laterality Date  ? BIOPSY  11/25/2019  ? Procedure: BIOPSY;  Surgeon: Ladene Artist, MD;  Location: [facility] ENDOSCOPY;  Service: Endoscopy;;  ? CARDIAC CATHETERIZATION    ? CARDIAC CATHETERIZATION N/A 01/28/2015  ? Procedure: Left Heart Cath and Coronary Angiography;  Surgeon: Wellington Hampshire, MD;  Location: [facility] CV LAB;  Service:  Cardiovascular;  Laterality: N/A;  ? COLONOSCOPY    ? ESOPHAGOGASTRODUODENOSCOPY (EGD) WITH PROPOFOL N/A 11/25/2019  ? Procedure: ESOPHAGOGASTRODUODENOSCOPY (EGD) WITH PROPOFOL;  Surgeon: Ladene Artist, MD;  Location: [facility] ENDOSCOPY;  Service: Endoscopy;  Laterality: N/A;  ? HIP ARTHROPLASTY Right 03/15/2013  ? Procedure: ARTHROPLASTY BIPOLAR HIP;  Surgeon: [name], MD;  Location: [facility];  Service: Orthopedics;  Laterality: Right;  ? LAPAROSCOPIC APPENDECTOMY N/A 01/01/2021  ? Procedure: DIAGNOSTIC LAPAROSCOPY; EXPLORATORY LAPAROTOMY, ILEUCECECTOMY, DRAINAGE OF ABDOMINAL ABSCESS;  Surgeon: Georganna Skeans, MD;  Location: [facility];  Service: General;  Laterality: N/A;  ? SPLENECTOMY, TOTAL N/A 01/06/2021   ? Procedure: SPLENECTOMY;  Surgeon: Jesusita Oka, MD;  Location: [place];  Service: General;  Laterality: N/A;  ? TOTAL SHOULDER ARTHROPLASTY Right 09/01/2015  ? Procedure: RIGHT TOTAL SHOULDER ARTHROPLASTY;  Surgeon: [name], MD;  Location: [facility];  Service: Orthopedics;  Laterality: Right;  ? ?Family History  ?Problem Relation Age of Onset  ? Diabetes Mother   ? Hypertension Mother   ? Diabetes Father   ? Hypertension Father   ? Diabetes Sister   ? Dementia Brother   ? Cancer Neg Hx   ? Heart disease Neg Hx   ? Stroke Neg Hx   ? Colon cancer Neg Hx   ? Esophageal cancer Neg Hx   ? Rectal cancer Neg Hx   ? Stomach cancer Neg Hx   ? ?Social History  ? ?Tobacco Use  ? Smoking status: Former  ?  Packs/day: 0.75  ?  Years: 30.00  ?  Pack years: 22.50  ?  Types: Cigarettes  ? Smokeless tobacco: Never  ?Substance Use Topics  ? Alcohol use: Not Currently  ?  Alcohol/week: 1.0 standard drink  ?  Types: 1 Glasses of wine per week  ?  Comment: occasionally  ? ?No Known Allergies ?Past Medical History:  ?Diagnosis Date  ? Alcohol abuse   ? 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 a. 05/2019 Echo: EF 35-40%; b. 10/2019 Echo: EF 45-50%.  ? Allergy   ? Asthma   ? 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 Chronic anemia   ? Clotting disorder ([place])   ? Diabetes mellitus, type 2 ([place])   ? pt reports his DM is gone  ? GI bleed   ? 15 years ago  ? Gout   ? Hemoptysis   ? secondary to pulmonary edema  ? HFimpEF (heart failure with improved ejection fraction) ([place])   ? a. 12/2010 Echo: EF 20-25%; b 08/2013 Echo: EF 45-50%; c. 01/2015 Echo: EF 25-30%; d. 07/2016 Echo: EF 35-40%; e. 05/2019 Echo: EF 35-40%; d. 10/2019 Echo: EF 45-50%, mild LVH, mild red RV fxn, mildl dil RA, Triv MR. Mild to mod Ao Sclerosis w/o stenosis.  ? Hyperlipidemia   ? Hypertension   ? Nonischemic cardiomyopathy ([place])   ? Nonobstructive Coronary artery disease   ? a. 01/2015 Cath: LM nl, LAD 30p/m, LCX nl, RCA 10p/m, RPDA min irregs.  ?  Osteoarthritis   ? Pneumonia   ? TIA (transient ischemic attack)   ? Tobacco abuse   ? Vision loss   ? peripherial vision only left eye.Central Retinal  artery occusion   ? ?Past Surgical History:  ?Procedure Laterality Date  ? BIOPSY  11/25/2019  ? Procedure: BIOPSY;  Surgeon: Ladene Artist, MD;  Location[facility] ENDOSCOPY;  Service: Endoscopy;;  ? CARDIAC CATHETERIZATION    ? CARDIAC CATHETERIZATION N/A 01/28/2015  ? Procedure:  Left Heart Cath and Coronary Angiography;  Surgeon: Wellington Hampshire, MD;  Location: [facility] CV LAB;  Service: Cardiovascular;  Laterality: N/A;  ? COLONOSCOPY    ? ESOPHAGOGASTRODUODENOSCOPY (EGD) WITH PROPOFOL N/A 11/25/2019  ? Procedure: ESOPHAGOGASTRODUODENOSCOPY (EGD) WITH PROPOFOL;  Surgeon: Ladene Artist, MD;  Location: [facility] ENDOSCOPY;  Service: Endoscopy;  Laterality: N/A;  ? HIP ARTHROPLASTY Right 03/15/2013  ? Procedure: ARTHROPLASTY BIPOLAR HIP;  Surgeon: [name], MD;  Location: [facility];  Service: Orthopedics;  Laterality: Right;  ? LAPAROSCOPIC APPENDECTOMY N/A 01/01/2021  ? Procedure: DIAGNOSTIC LAPAROSCOPY; EXPLORATORY LAPAROTOMY, ILEUCECECTOMY, DRAINAGE OF ABDOMINAL ABSCESS;  Surgeon: Georganna Skeans, MD;  Location: [facility];  Service: General;  Laterality: N/A;  ? 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 Procedure: SPLENECTOMY;  Surgeon: Jesusita Oka, MD;  Location: [facility];  Service: General;  Laterality: N/A;  ? TOTAL SHOULDER ARTHROPLASTY Right 09/01/2015  ? Procedure: RIGHT TOTAL SHOULDER ARTHROPLASTY;  Surgeon: [name], MD;  Location: [facility];  Service: Orthopedics;  Laterality: Right;  ? ?Family History  ?Problem Relation Age of Onset  ? Diabetes Mother   ? Hypertension Mother   ? Diabetes Father   ? Hypertension Father   ? Diabetes Sister   ? Dementia Brother   ? Cancer Neg Hx   ? Heart disease Neg Hx   ? Stroke Neg Hx   ? Colon cancer Neg Hx   ? Esophageal cancer Neg Hx   ? Rectal cancer Neg Hx   ? Stomach cancer Neg Hx   ? ?Social History  ? ?Tobacco Use  ?  Smoking status: Former  ?  Packs/day: 0.75  ?  Years: 30.00  ?  Pack years: 22.50  ?  Types: Cigarettes  ? Smokeless tobacco: Never  ?Substance Use Topics  ? Alcohol use: Not Currently  ?  Alcohol/week: 1.0 standar

## 2021-07-31 ENCOUNTER — Ambulatory Visit (INDEPENDENT_AMBULATORY_CARE_PROVIDER_SITE_OTHER): Payer: Medicare Other

## 2021-07-31 VITALS — Ht 76.0 in | Wt 190.0 lb

## 2021-07-31 DIAGNOSIS — Z0000 Encounter for general adult medical examination without abnormal findings: Secondary | ICD-10-CM

## 2021-07-31 NOTE — Patient Instructions (Addendum)
?Mr. Coopman , ?Thank you for taking time to come for your Medicare Wellness Visit. I appreciate your ongoing commitment to your health goals. Please review the following plan we discussed and let me know if I can assist you in the future.  ? ?These are the goals we discussed: ? Goals   ? ?   Increase physical activity (pt-stated)   ?   I would like to walk more ?  ? ?  ?  ?This is a list of the screening recommended for you and due dates:  ?Health Maintenance  ?Topic Date Due  ? Complete foot exam   12/30/2019  ? Zoster (Shingles) Vaccine (1 of 2) 10/31/2021*  ? Flu Shot  10/24/2021  ? Hemoglobin A1C  01/09/2022  ? Eye exam for diabetics  06/14/2022  ? Pneumonia Vaccine (3 - PPSV23 if available, else PCV20) 07/31/2022  ? Colon Cancer Screening  08/28/2026  ? Tetanus Vaccine  11/12/2026  ? COVID-19 Vaccine  Completed  ? Hepatitis C Screening: USPSTF Recommendation to screen - Ages 36-79 yo.  Completed  ? HIV Screening  Completed  ? HPV Vaccine  Aged Out  ? Urine Protein Check  Discontinued  ?*Topic was postponed. The date shown is not the original due date.  ?  ?Opioid Pain Medicine Management ?Opioids are powerful medicines that are used to treat moderate to severe pain. When used for short periods of time, they can help you to: ?Sleep better. ?Do better in physical or occupational therapy. ?Feel better in the first few days after an injury. ?Recover from surgery. ?Opioids should be taken with the supervision of a trained health care provider. They should be taken for the shortest period of time possible. This is because opioids can be addictive, and the longer you take opioids, the greater your risk of addiction. This addiction can also be called opioid use disorder. ?What are the risks? ?Using opioid pain medicines for longer than 3 days increases your risk of side effects. Side effects include: ?Constipation. ?Nausea and vomiting. ?Breathing difficulties (respiratory depression). ?Drowsiness. ?Confusion. ?Opioid  use disorder. ?Itching. ?Taking opioid pain medicine for a long period of time can affect your ability to do daily tasks. It also puts you at risk for: ?Motor vehicle crashes. ?Depression. ?Suicide. ?Heart attack. ?Overdose, which can be life-threatening. ?What is a pain treatment plan? ?A pain treatment plan is an agreement between you and your health care provider. Pain is unique to each person, and treatments vary depending on your condition. To manage your pain, you and your health care provider need to work together. To help you do this: ?Discuss the goals of your treatment, including how much pain you might expect to have and how you will manage the pain. ?Review the risks and benefits of taking opioid medicines. ?Remember that a good treatment plan uses more than one approach and minimizes the chance of side effects. ?Be honest about the amount of medicines you take and about any drug or alcohol use. ?Get pain medicine prescriptions from only one health care provider. ?Pain can be managed with many types of alternative treatments. Ask your health care provider to refer you to one or more specialists who can help you manage pain through: ?Physical or occupational therapy. ?Counseling (cognitive behavioral therapy). ?Good nutrition. ?Biofeedback. ?Massage. ?Meditation. ?Non-opioid medicine. ?Following a gentle exercise program. ?How to use opioid pain medicine ?Taking medicine ?Take your pain medicine exactly as told by your health care provider. Take it only when you need  it. ?If your pain gets less severe, you may take less than your prescribed dose if your health care provider approves. ?If you are not having pain, do nottake pain medicine unless your health care provider tells you to take it. ?If your pain is severe, do nottry to treat it yourself by taking more pills than instructed on your prescription. Contact your health care provider for help. ?Write down the times when you take your pain medicine. It  is easy to become confused while on pain medicine. Writing the time can help you avoid overdose. ?Take other over-the-counter or prescription medicines only as told by your health care provider. ?Keeping yourself and others safe ? ?While you are taking opioid pain medicine: ?Do not drive, use machinery, or power tools. ?Do not sign legal documents. ?Do not drink alcohol. ?Do not take sleeping pills. ?Do not supervise children by yourself. ?Do not do activities that require climbing or being in high places. ?Do not go to a lake, river, ocean, spa, or swimming pool. ?Do not share your pain medicine with anyone. ?Keep pain medicine in a locked cabinet or in a secure area where pets and children cannot reach it. ?Stopping your use of opioids ?If you have been taking opioid medicine for more than a few weeks, you may need to slowly decrease (taper) how much you take until you stop completely. Tapering your use of opioids can decrease your risk of symptoms of withdrawal, such as: ?Pain and cramping in the abdomen. ?Nausea. ?Sweating. ?Sleepiness. ?Restlessness. ?Uncontrollable shaking (tremors). ?Cravings for the medicine. ?Do not attempt to taper your use of opioids on your own. Talk with your health care provider about how to do this. Your health care provider may prescribe a step-down schedule based on how much medicine you are taking and how long you have been taking it. ?Getting rid of leftover pills ?Do not save any leftover pills. Get rid of leftover pills safely by: ?Taking the medicine to a prescription take-back program. This is usually offered by the county or law enforcement. ?Bringing them to a pharmacy that has a drug disposal container. ?Flushing them down the toilet. Check the label or package insert of your medicine to see whether this is safe to do. ?Throwing them out in the trash. Check the label or package insert of your medicine to see whether this is safe to do. ?If it is safe to throw it out, remove  the medicine from the original container, put it into a sealable bag or container, and mix it with used coffee grounds, food scraps, dirt, or cat litter before putting it in the trash. ?Follow these instructions at home: ?Activity ?Do exercises as told by your health care provider. ?Avoid activities that make your pain worse. ?Return to your normal activities as told by your health care provider. Ask your health care provider what activities are safe for you. ?General instructions ?You may need to take these actions to prevent or treat constipation: ?Drink enough fluid to keep your urine pale yellow. ?Take over-the-counter or prescription medicines. ?Eat foods that are high in fiber, such as beans, whole grains, and fresh fruits and vegetables. ?Limit foods that are high in fat and processed sugars, such as fried or sweet foods. ?Keep all follow-up visits. This is important. ?Where to find support ?If you have been taking opioids for a long time, you may benefit from receiving support for quitting from a local support group or counselor. Ask your health care provider for a  referral to these resources in your area. ?Where to find more information ?Centers for Disease Control and Prevention (CDC): http://www.wolf.info/ ?U.S. Food and Drug Administration (FDA): [place].ch ?Get help right away if: ?You may have taken too much of an opioid (overdosed). Common symptoms of an overdose: ?Your breathing is slower or more shallow than normal. ?You have a very slow heartbeat (pulse). ?You have slurred speech. ?You have nausea and vomiting. ?Your pupils become very small. ?You have other potential symptoms: ?You are very confused. ?You faint or feel like you will faint. ?You have cold, clammy skin. ?You have blue lips or fingernails. ?You have thoughts of harming yourself or harming others. ?These symptoms may represent a serious problem that is an emergency. Do not wait to see if the symptoms will go away. Get medical help right away.  Call your local emergency services (911 in the [place] Do not drive yourself to the hospital.  ?If you ever feel like you may hurt yourself or others, or have thoughts about taking your own life, get

## 2021-07-31 NOTE — Progress Notes (Signed)
? ?Subjective:  ? Travis [name] is a 66 y.o. male who presents for Medicare Annual/Subsequent preventive examination. ? ?Review of Systems    ?Virtual Visit via Telephone Note ? ?I connected with  Travis [name] on 07/31/21 at  9:45 AM EDT by telephone and verified that I am speaking with the correct person using two identifiers. ? ?Location: ?Patient: Home ?Provider: Office ?Persons participating in the virtual visit: patient/Nurse Health Advisor ?  ?I discussed the limitations, risks, security and privacy concerns of performing an evaluation and management service by telephone and the availability of in person appointments. The patient expressed understanding and agreed to proceed. ? ?Interactive audio and video telecommunications were attempted between this nurse and patient, however failed, due to patient having technical difficulties OR patient did not have access to video capability.  We continued and completed visit with audio only. ? ?Some vital signs may be absent or patient reported.  ? ?Travis Peaches, LPN  ?Cardiac Risk Factors include: advanced age (>26mn, >>64women);diabetes mellitus;male gender;hypertension ? ?   ?Objective:  ?  ?Today's Vitals  ? 07/31/21 1005  ?Weight: 190 lb (86.2 kg)  ?Height: '6\' 4"'  (1.93 m)  ? ?Body mass index is 23.13 kg/m?. ? ? ?  07/31/2021  ? 10:17 AM 02/05/2021  ?  6:00 PM 10/02/2020  ? 12:22 PM 09/23/2020  ?  1:33 PM 06/06/2020  ?  3:10 PM 04/28/2020  ?  9:16 AM 02/02/2020  ?  4:59 PM  ?Advanced Directives  ?Does Patient Have a Medical Advance Directive? Yes Yes No No No No No  ?Type of [place] Healthcare Power of Attorney       ?Does patient want to make changes to medical advance directive? No - Patient declined No - Patient declined       ?Copy of [place]? No - copy requested No - copy requested       ?Would patient like information on creating a medical advance directive?   No - Patient declined  No - Patient declined  No - Patient declined No - Patient declined  ? ? ?Current Medications (verified) ?Outpatient Encounter Medications as of 07/31/2021  ?Medication Sig  ? acetaminophen (TYLENOL) 500 MG tablet Take 2 tablets (1,000 mg total) by mouth every 8 (eight) hours as needed.  ? albuterol (VENTOLIN HFA) 108 (90 Base) MCG/ACT inhaler Inhale 2 puffs into the lungs every 6 (six) hours as needed for wheezing or shortness of breath.  ? allopurinol (ZYLOPRIM) 100 MG tablet Take 100 mg by mouth 2 (two) times daily.  ? atorvastatin (LIPITOR) 20 MG tablet Take 1 tablet (20 mg total) by mouth daily.  ? Blood Pressure KIT Check b/p once daily. Based on insurance preference  ? carvedilol (COREG) 6.25 MG tablet Take 6.25 mg by mouth 2 (two) times daily with a meal.  ? cetirizine (ZYRTEC) 10 MG tablet Take 10 mg by mouth daily.  ? fenofibrate (TRICOR) 145 MG tablet Take 145 mg by mouth daily.  ? ferrous sulfate 325 (65 FE) MG tablet Take 1 tablet (325 mg total) by mouth daily with breakfast.  ? fluticasone (FLONASE) 50 MCG/ACT nasal spray Place 1-2 sprays into both nostrils daily as needed for allergies or rhinitis.  ? hydroxypropyl methylcellulose / hypromellose (ISOPTO TEARS / GONIOVISC) 2.5 % ophthalmic solution Place 1 drop into both eyes 3 (three) times daily as needed for dry eyes.  ? magnesium oxide (MAG-OX) 400  MG tablet Take 1 tablet (400 mg total) by mouth daily.  ? metFORMIN (GLUCOPHAGE) 500 MG tablet 1 tablet by mouth every morning with food for 7 days. Then 1 tab BID with food after that  ? multivitamin (ONE-A-DAY MEN'S) TABS tablet Take 1 tablet by mouth daily with breakfast.  ? Omega-3 Fatty Acids (FISH OIL PO) Take 1 capsule by mouth daily.  ? pantoprazole (PROTONIX) 40 MG tablet TAKE 1 TABLET BY MOUTH ONCE A DAY  ? ?No facility-administered encounter medications on file as of 07/31/2021.  ? ? ?Allergies (verified) ?Wilder Glade [dapagliflozin]  ? ?History: ?Past Medical History:  ?Diagnosis Date  ? Alcohol abuse    ? 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 a. 05/2019 Echo: EF 35-40%; b. 10/2019 Echo: EF 45-50%.  ? Allergy   ? Asthma   ? 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 Chronic anemia   ? Clotting disorder ([place])   ? Diabetes mellitus, type 2 ([place])   ? pt reports his DM is gone  ? GI bleed   ? 15 years ago  ? Gout   ? Hemoptysis   ? secondary to pulmonary edema  ? 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 a. 12/2010 Echo: EF 20-25%; b 08/2013 Echo: EF 45-50%; c. 01/2015 Echo: EF 25-30%; d. 07/2016 Echo: EF 35-40%; e. 05/2019 Echo: EF 35-40%; d. 10/2019 Echo: EF 45-50%, mild LVH, mild red RV fxn, mildl dil RA, Triv MR. Mild to mod Ao Sclerosis w/o stenosis.  ? Hyperlipidemia   ? Hypertension   ? Nonischemic cardiomyopathy ([place])   ? Nonobstructive Coronary artery disease   ? a. 01/2015 Cath: LM nl, LAD 30p/m, LCX nl, RCA 10p/m, RPDA min irregs.  ? Osteoarthritis   ? Pneumonia   ? TIA (transient ischemic attack)   ? Tobacco abuse   ? Vision loss   ? peripherial vision only left eye.Central Retinal  artery occusion   ? ?Past Surgical History:  ?Procedure Laterality Date  ? BIOPSY  11/25/2019  ? Procedure: BIOPSY;  Surgeon: Ladene Artist, MD;  [facility] END[facility];  Service: Endoscopy;;  ? CARDIAC CATHETERIZATION    ? CARDIAC CATHETERIZATION N/A 01/28/2015  ? Procedure: Left Heart Cath and Coronary Angiography;  Surgeon: Wellington Hampshire, MD;  Location: [place] CV LAB;  Service: Cardiovascular;  Laterality: N/A;  ? COLONOSCOPY    ? ESOPHAGOGASTRODUODENOSCOPY (EGD) WITH PROPOFOL N/A 11/25/2019  ? Procedure: ESOPHAGOGASTRODUODENOSCOPY (EGD) WITH PROPOFOL;  Surgeon: Ladene Artist, MD;  Location: [facility] ENDOSCOPY;  Service: Endoscopy;  Laterality: N/A;  ? HIP ARTHROPLASTY Right 03/15/2013  ? Procedure: ARTHROPLASTY BIPOLAR HIP;  Surgeon: [name], MD;  Location: [place];  Service: Orthopedics;  Laterality: Right;  ? LAPAROSCOPIC APPENDECTOMY N/A 01/01/2021  ? Procedure:  DIAGNOSTIC LAPAROSCOPY; EXPLORATORY LAPAROTOMY, ILEUCECECTOMY, DRAINAGE OF ABDOMINAL ABSCESS;  Surgeon: Georganna Skeans, MD;  Location: [place];  Service: General;  Laterality: N/A;  ? 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 Procedure: SPLENECTOMY;  Surgeon: Jesusita Oka, MD;  Location: [place];  Service: General;  Laterality: N/A;  ? TOTAL SHOULDER ARTHROPLASTY Right 09/01/2015  ? Procedure: RIGHT TOTAL SHOULDER ARTHROPLASTY;  Surgeon: [name], MD;  Location: [place];  Service: Orthopedics;  Laterality: Right;  ? ?Family History  ?Problem Relation Age of Onset  ? Diabetes Mother   ? Hypertension Mother   ? Diabetes Father   ? Hypertension Father   ? Diabetes Sister   ? Dementia Brother   ?  Cancer Neg Hx   ? Heart disease Neg Hx   ? Stroke Neg Hx   ? Colon cancer Neg Hx   ? Esophageal cancer Neg Hx   ? Rectal cancer Neg Hx   ? Stomach cancer Neg Hx   ? ?Social History  ? ?Socioeconomic History  ? Marital status: Single  ?  Spouse name: Not on file  ? Number of children: 0  ? Years of education: 53  ? Highest education level: High school graduate  ?Occupational History  ? Occupation: Retired  ?Tobacco Use  ? Smoking status: Former  ?  Packs/day: 0.75  ?  Years: 30.00  ?  Pack years: 22.50  ?  Types: Cigarettes  ? Smokeless tobacco: Never  ?Vaping Use  ? Vaping Use: Never used  ?Substance and Sexual Activity  ? Alcohol use: Not Currently  ?  Alcohol/week: 1.0 standard drink  ?  Types: 1 Glasses of wine per week  ?  Comment: occasionally  ? Drug use: Not Currently  ?  Types: Marijuana  ? Sexual activity: Yes  ?  Birth control/protection: Condom  ?Other Topics Concern  ? Not on file  ?Social History Narrative  ? Not on file  ? ?Social Determinants of Health  ? ?Financial Resource Strain: Low Risk   ? Difficulty of Paying Living Expenses: Not hard at all  ?Food Insecurity: No Food Insecurity  ? Worried About Charity fundraiser in the Last Year: Never true  ? Ran Out of Food in the Last Year: Never true  ?Transportation  Needs: No Transportation Needs  ? Lack of Transportation (Medical): No  ? Lack of Transportation (Non-Medical): No  ?Physical Activity: Sufficiently Active  ? Days of Exercise per Week: 6 days  ? Minutes o

## 2021-08-01 ENCOUNTER — Other Ambulatory Visit: Payer: Self-pay | Admitting: Nurse Practitioner

## 2021-08-01 DIAGNOSIS — E119 Type 2 diabetes mellitus without complications: Secondary | ICD-10-CM

## 2021-08-02 ENCOUNTER — Other Ambulatory Visit: Payer: Self-pay | Admitting: Nurse Practitioner

## 2021-08-02 DIAGNOSIS — E1165 Type 2 diabetes mellitus with hyperglycemia: Secondary | ICD-10-CM

## 2021-08-02 MED ORDER — METFORMIN HCL 500 MG PO TABS: 500.0000 mg | ORAL_TABLET | Freq: Two times a day (BID) | ORAL | 0 refills | Status: DC

## 2021-08-07 ENCOUNTER — Other Ambulatory Visit: Payer: Self-pay | Admitting: Nurse Practitioner

## 2021-08-07 DIAGNOSIS — E1165 Type 2 diabetes mellitus with hyperglycemia: Secondary | ICD-10-CM

## 2021-08-16 ENCOUNTER — Other Ambulatory Visit: Payer: Self-pay | Admitting: Nurse Practitioner

## 2021-08-18 ENCOUNTER — Encounter: Payer: Self-pay | Admitting: Nurse Practitioner

## 2021-08-18 ENCOUNTER — Other Ambulatory Visit: Payer: Medicare Other | Admitting: Nurse Practitioner

## 2021-08-18 VITALS — BP 112/76 | Temp 93.0°F

## 2021-08-18 DIAGNOSIS — E1100 Type 2 diabetes mellitus with hyperosmolarity without nonketotic hyperglycemic-hyperosmolar coma (NKHHC): Secondary | ICD-10-CM | POA: Diagnosis not present

## 2021-08-18 DIAGNOSIS — Z515 Encounter for palliative care: Secondary | ICD-10-CM | POA: Diagnosis not present

## 2021-08-18 DIAGNOSIS — R5381 Other malaise: Secondary | ICD-10-CM

## 2021-08-18 NOTE — Progress Notes (Signed)
[facility] Palliative Care Consult Note Telephone: [phone]  Fax: [phone]    Date of encounter: 08/18/21 11:00 AM PATIENT NAME: Travis [name] [address] [place] [address]   [phone] (home)  DOB: [birth year] MRN: [id number] PRIMARY CARE PROVIDER:    [name], NP,  [place] [address] [phone]  RESPONSIBLE PARTY:    Contact Information     Name Relation Home Work Mobile   Travis [name] Niece [phone]  [phone]   Travis [name] [phone]  [phone]   Travis [name]   [phone]      I connected with Travis [name] PC RN with  Travis [name] on 08/18/21 by a video enabled telemedicine application and verified that I am speaking with the correct person using two identifiers.   I discussed the limitations of evaluation and management by telemedicine. The patient expressed understanding and agreed to proceed.  Palliative Care was asked to follow this patient by consultation request of  Travis Pitcher, NP to address advance care planning and complex medical decision making. This is a follow up visit.                                ASSESSMENT AND PLAN / RECOMMENDATIONS:  Advance Care Planning/Goals of Care: Goals include to maximize quality of life and symptom management. Patient/health care surrogate gave his/her permission to discuss. Our advance care planning conversation included a discussion about:    The value and importance of advance care planning  Experiences with loved ones who have been seriously ill or have died  Exploration of personal, cultural or spiritual beliefs that might influence medical decisions  Exploration of goals of care in the event of a sudden injury or illness  Identification of a healthcare agent-[name] Niece Review and updating of an  advance directive document . CODE STATUS:  Full  Symptom Management/Plan:  ACP: Reviewed with  patient. He has elected to reverse the DNR status as his quality of life has improved significantly since last October.  Reviewed the current MOST form and patient has elected CPR, Full Medical Interventions, Antibiotics if indicated, IVF if indicated and tube feeding for a defined trial period.  Patient states his niece Travis [name] is his HCPOA and she is aware of his wishes.  Patient states they have had a conversation about how long to be on a ventilator and when to stop aggressive measures.  Updated MOST will be uploaded into [employer].   2. Diabetes: Blood sugar running in the 120's fasting but in the 200's in the evening.  Discussed diet and resources provided on sugar free options. Patient will share with Travis [name] has they often grocery shop together. Education done on DM  3. CHF:  Patient is weighing himself about every week to every other week.  He is aware of notifying provider of weight gain of 3 lbs in 24 hours or 5 lbs in 1 week.  Patient endorses routine visits with HF clinic and feels this is managed well.   4. Debility, resolved:  Patient lives alone but niece Crystal assists with managing appointments and medical decisions.  Patient is walking about 2 miles daily in his neighborhood and on the walking track nearby.  Patient is able to drive himself to appointments but endorses his car is in the shop for repairs at this time.  No assistive devices are  needed at this time.  No falls reported.   Follow up Palliative Care Visit: Palliative care will continue to follow for complex medical decision making, advance care planning, and clarification of goals. Return 12 weeks or prn.  I spent 42 minutes providing this consultation. More than 50% of the time in this consultation was spent in counseling and care coordination. PPS: 70% Chief Complaint: Follow up palliative consult for complex medical decision making  HISTORY OF PRESENT ILLNESS:  Travis [name] is a 66 y.o. year old male  with multiple  medical problems including HFrEF, CM, CAD, CKD, COPD, asthma, central retinal artery occlusion of left eye, asthma, anemia, DM, HLD, HTN, OA, h/o GI bleed, gout, h/o alcohol abuse, tobacco abuse.I connected with Travis [name] PC RN with Travis [name]. We talked about f/u pc visit, ros, sympotoms, blood sugars, management, nutrition. We talked about Travis [name] ambulating 2 miles daily. We talked about overall improvement, medical goals, code status with wishes for full code, reviewed most form. We talked about purpose of pc visit, f/u scheduled. Emotional support provided. Questions answered.   History obtained from review of EMR, discussion with Travis. [name].  I reviewed available labs, medications, imaging, studies and related documents from the EMR.  Records reviewed and summarized above.   ROS General: NAD EYES: denies vision changes ENMT: denies dysphagia Cardiovascular: denies chest pain, denies DOE Pulmonary: denies cough, denies increased SOB Abdomen: endorses good appetite, denies constipation, endorses continence of bowel GU: denies dysuria, endorses continence of urine MSK:  denies increased weakness,  no falls reported Skin: denies rashes or wounds Neurological: denies pain, + insomnia Psych: Endorses positive mood Heme/lymph/immuno: denies bruises, abnormal bleeding  Physical Exam:by Travis [name] PC RN Current: 190 lbs Constitutional: NAD General: WNWD EYES: anicteric sclera, lids intact, no discharge  ENMT: intact hearing, oral mucous membranes moist, dentition intact CV: RRR, no LE edema Pulmonary: LCTA, no increased work of breathing, no cough, room air Abdomen: intake 75-100%, normo-active BS + 4 quadrants, soft and non tender, no ascites GU: deferred MSK: no sarcopenia, moves all extremities, ambulatory Skin: warm and dry, no rashes or wounds on visible skin Neuro:  no generalized weakness,  no cognitive impairment Psych: non-anxious affect, A and O x 3 Hem/lymph/immuno: no  widespread bruising  Thank you for the opportunity to participate in the care of Travis [name].  The palliative care team will continue to follow. Please call our office at [phone] if we can be of additional assistance.   Lorenza Burton, RN

## 2021-08-22 ENCOUNTER — Other Ambulatory Visit: Payer: Self-pay | Admitting: Family

## 2021-09-04 ENCOUNTER — Other Ambulatory Visit: Payer: Self-pay | Admitting: Nurse Practitioner

## 2021-09-14 ENCOUNTER — Ambulatory Visit (INDEPENDENT_AMBULATORY_CARE_PROVIDER_SITE_OTHER): Payer: Medicare Other

## 2021-09-14 DIAGNOSIS — D649 Anemia, unspecified: Secondary | ICD-10-CM

## 2021-09-14 DIAGNOSIS — E1165 Type 2 diabetes mellitus with hyperglycemia: Secondary | ICD-10-CM

## 2021-09-14 DIAGNOSIS — I5022 Chronic systolic (congestive) heart failure: Secondary | ICD-10-CM

## 2021-09-14 NOTE — Chronic Care Management (AMB) (Signed)
Chronic Care Management   CCM RN Visit Note  09/14/2021 Name: Travis [name] MRN: [id number] DOB: [birth year]  Subjective: Travis [name] is a 66 y.o. year old male who is a primary care patient of [name], NP. The care management team was consulted for assistance with disease management and care coordination needs.    Engaged with patient by telephone for follow up visit in response to provider referral for case management and/or care coordination services.   Consent to Services:  The patient was given information about Chronic Care Management services, agreed to services, and gave verbal consent prior to initiation of services.  Please see initial visit note for detailed documentation.   Patient agreed to services and verbal consent obtained.   Assessment: Review of patient past medical history, allergies, medications, health status, including review of consultants reports, laboratory and other test data, was performed as part of comprehensive evaluation and provision of chronic care management services.   SDOH (Social Determinants of Health) assessments and interventions performed:    CCM Care Plan  Allergies  Allergen Reactions   Farxiga [Dapagliflozin] Other (See Comments)    Reduction in kidney function    Outpatient Encounter Medications as of 09/14/2021  Medication Sig   acetaminophen (TYLENOL) 500 MG tablet Take 2 tablets (1,000 mg total) by mouth every 8 (eight) hours as needed.   albuterol (VENTOLIN HFA) 108 (90 Base) MCG/ACT inhaler Inhale 2 puffs into the lungs every 6 (six) hours as needed for wheezing or shortness of breath.   allopurinol (ZYLOPRIM) 100 MG tablet Take 100 mg by mouth 2 (two) times daily.   atorvastatin (LIPITOR) 20 MG tablet TAKE 1 TABLET BY MOUTH DAILY   Blood Pressure KIT Check b/p once daily. Based on insurance preference   carvedilol (COREG) 6.25 MG tablet TAKE 1 TABLET BY MOUTH TWICE A DAY   cetirizine (ZYRTEC) 10 MG tablet Take 10 mg by  mouth daily.   fenofibrate (TRICOR) 145 MG tablet TAKE 1 TABLET BY MOUTH ONCE A DAY   ferrous sulfate 325 (65 FE) MG tablet Take 1 tablet (325 mg total) by mouth daily with breakfast.   fluticasone (FLONASE) 50 MCG/ACT nasal spray Place 1-2 sprays into both nostrils daily as needed for allergies or rhinitis.   hydroxypropyl methylcellulose / hypromellose (ISOPTO TEARS / GONIOVISC) 2.5 % ophthalmic solution Place 1 drop into both eyes 3 (three) times daily as needed for dry eyes.   magnesium oxide (MAG-OX) 400 MG tablet Take 1 tablet (400 mg total) by mouth daily.   metFORMIN (GLUCOPHAGE) 500 MG tablet Take 1 tablet (500 mg total) by mouth 2 (two) times daily with a meal.   multivitamin (ONE-A-DAY MEN'S) TABS tablet Take 1 tablet by mouth daily with breakfast.   Omega-3 Fatty Acids (FISH OIL PO) Take 1 capsule by mouth daily.   pantoprazole (PROTONIX) 40 MG tablet TAKE 1 TABLET BY MOUTH ONCE A DAY   No facility-administered encounter medications on file as of 09/14/2021.    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   Protein-calorie malnutrition, severe 05/27/2020   Hypotension 05/26/2020   AKI (acute kidney injury) ([place]) 05/26/2020   Alcohol use 11/20/2019  CKD (chronic kidney disease), stage III ([place]) 11/20/2019   TIA (transient ischemic attack) 11/08/2019   Chronic bilateral low back pain without sciatica 06/26/2019   Osteoarthritis 05/09/2016   COPD (chronic obstructive pulmonary disease) ([place]) 05/09/2016   Tachycardia 05/23/2015   Essential hypertension 04/18/2015   Literacy level of illiterate 06/04/2014   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   Cardiomyopathy,  nonischemic ([place]) 21/19/4174   Chronic systolic heart failure ([place]) 01/05/2011   Tobacco use 01/05/2011   Diabetes ([place]) 11/30/2008   HLD (hyperlipidemia) 11/30/2008   Gout 11/30/2008   Iron deficiency anemia 11/30/2008    Conditions to be addressed/monitored:CHF, DMII, and anemia  Care Plan : [facility] plan of care  Updates made by Dannielle Karvonen, RN since 09/14/2021 12:00 AM     Problem: Chronic disease management, education, and / or care coordination   Priority: High     Long-Range Goal: Development of plan of care to address chronic disease management and / or care coordination needs   Start Date: 02/24/2021  Expected End Date: 09/22/2021  Priority: High  Note:   Goals met. Case Closed Current Barriers:  Knowledge Deficits related to plan of care for management of CHF and Anemia  Chronic Disease Management support and education needs related to CHF and Anemia Medication management:  Referral made to pharmacist Patient reports he is doing well.  He reports having follow up with cardiologist on 07/25/2021.  Denies any changed in treatment plan.  Patient states he had a visit from [facility] palliative care.  He states he is not clear why they are seeing him.  RNCM discussed with patient and also advised patient to follow up with his primary care provider if he had further questions.  Patient reports having follow up visit with general surgeon on 07/13/21. Patient states he was advised to follow up on ly as needed.     RNCM Clinical Goal(s):  Patient will verbalize basic understanding of CHF and Anemia disease process and self health management plan as evidenced by patient report and/ or notation in chart take all medications exactly as prescribed and will call provider for medication related questions as evidenced by patient report and/ or notation in chart    demonstrate understanding of rationale for each prescribed medication as evidenced by patient report and/ or notation in chart     attend all scheduled medical appointments:   as evidenced by patient report and/ or notation in chart        continue to work with RN Care Manager and/or Social Worker to address care management and care coordination needs related to CHF and anemia as evidenced by adherence to CM Team Scheduled appointments     work with pharmacist to address medication management related to CHF and anemia as evidenced by review of EMR and patient or pharmacist report    through collaboration with Consulting civil engineer, provider, and care team.   Interventions: 1:1 collaboration with primary care provider regarding development and update of comprehensive plan of care as evidenced by provider attestation and co-signature Inter-disciplinary care team collaboration (see longitudinal plan of care) Evaluation of current treatment plan related to  self management and patient's adherence to plan as established by provider   Anemia/Bleeding Interventions:  (Status:  Goal Met.)   Medications reviewed and compliance discussed  Counseled on importance of regular laboratory monitoring as directed by provider Lab Results  Component Value Date   WBC 6.3 07/10/2021   HGB 13.9 07/10/2021   HCT  41.7 07/10/2021   MCV 95.1 07/10/2021   PLT 326.0 07/10/2021   Heart Failure Interventions:  (Status:  Goal Met.)  Advised to follow low sodium diet Encouraged to weigh daily and record Discussed the importance of keeping all appointments with provider Reviewed signs/ symptoms of heart failure. Advised to notify provider for a weight gain of 3 lbs overnight or 5 lbs in a week.  Discussed heart failure action plan/ zones. Advised to notify provider for mild/ moderate symptoms and call 911 for severe symptoms Medications reviewed and compliance discussed.   Diabetes Interventions: ( status: Goal met)  Medications reviewed and compliance discussed Advised to check blood sugars as recommended by provider Advised to follow up with  providers as recommended Advised to eat more whole grains, vegetables/ fruit and lean meats.  Monitor Carbohydrate intake    Patient Goals/Self-Care Activities: -Call pharmacy for medication refills 3-7 days in advance of running out of medications -  continue to take your medications as prescribed. Refill your prescriptions timely. - Weigh daily and record (notifying MD of 3 lb weight gain over night or 5 lb in a week) - Continue to follow a low salt diet ( re-discuss this with Dr. Lanora Manis at your next office visit for clarification)  - Continue to eat more whole grains, fruits and vegetables, lean meats and healthy fats - Continue to keep your follow up appointments with your doctors.  - Continue to monitor blood sugars 2-3 times per week.            Plan:No further follow up required: Goals met.  Case closed [name] RN,BSN,CCM RN Case Manager [place]  [phone]

## 2021-09-14 NOTE — Patient Instructions (Signed)
Travis [name] Congratulations on achieving your goals! It was a pleasure working with you, and I hope you continue to make great strides in improving your health.  Follow up Plan: If further intervention is needed the care management team is available to follow up after a formal CCM referral is placed  and No follow up scheduled with CCM team at this time   Please call the care guide team at [phone] if you need to cancel or reschedule your appointment.   If you are experiencing a Mental Health or [place] or need someone to talk to, please call the Suicide and Crisis Lifeline: 988 call 1-800-273-TALK (toll free, 24 hour hotline)   Patient verbalizes understanding of instructions and care plan provided today and agrees to view in [place]. Active MyChart status and patient understanding of how to access instructions and care plan via MyChart confirmed with patient.     [name] RN,BSN,CCM RN Case Manager [place]  [phone]

## 2021-09-17 ENCOUNTER — Encounter: Payer: Self-pay | Admitting: Emergency Medicine

## 2021-09-17 ENCOUNTER — Other Ambulatory Visit: Payer: Self-pay

## 2021-09-17 ENCOUNTER — Emergency Department
Admission: EM | Admit: 2021-09-17 | Discharge: 2021-09-17 | Disposition: A | Discharge status: Home / Self Care | Payer: Medicare Other | Attending: Emergency Medicine | Admitting: Emergency Medicine

## 2021-09-17 DIAGNOSIS — E119 Type 2 diabetes mellitus without complications: Secondary | ICD-10-CM | POA: Diagnosis not present

## 2021-09-17 DIAGNOSIS — L259 Unspecified contact dermatitis, unspecified cause: Secondary | ICD-10-CM | POA: Diagnosis not present

## 2021-09-17 DIAGNOSIS — R21 Rash and other nonspecific skin eruption: Secondary | ICD-10-CM | POA: Diagnosis present

## 2021-09-17 LAB — CBG MONITORING, ED: Glucose-Capillary: 122 mg/dL — ABNORMAL HIGH (ref 70–99)

## 2021-09-17 MED ORDER — HYDROCORTISONE 2.5 % EX CREA: TOPICAL_CREAM | Freq: Two times a day (BID) | CUTANEOUS | 0 refills | Status: AC

## 2021-09-22 DIAGNOSIS — D649 Anemia, unspecified: Secondary | ICD-10-CM

## 2021-09-22 DIAGNOSIS — I509 Heart failure, unspecified: Secondary | ICD-10-CM | POA: Diagnosis not present

## 2021-09-22 DIAGNOSIS — E1159 Type 2 diabetes mellitus with other circulatory complications: Secondary | ICD-10-CM | POA: Diagnosis not present

## 2021-09-22 DIAGNOSIS — Z7984 Long term (current) use of oral hypoglycemic drugs: Secondary | ICD-10-CM

## 2021-09-23 ENCOUNTER — Other Ambulatory Visit: Payer: Self-pay | Admitting: Nurse Practitioner

## 2021-09-23 DIAGNOSIS — E1165 Type 2 diabetes mellitus with hyperglycemia: Secondary | ICD-10-CM

## 2021-09-25 NOTE — Progress Notes (Signed)
Cardiology Office Note    Date:  09/29/2021   ID:  Travis [name], DOB December 24, 1955, MRN [id number]  PCP:  [name], NP  Cardiologist:  Kathlyn Sacramento, MD  Electrophysiologist:  None   Chief Complaint: Follow up  History of Present Illness:   Travis [name] is a 66 y.o. male with history of CAD, alcoholic cardiomyopathy with HFrEF with an LVEF of 45 to 50%, LVH, trivial mitral regurgitation by echo in 10/2019, DM2, TIA, CKD stage III, chronic anemia with pancytopenia, HTN, HLD, gastric ulcer disease with prior GI bleed, and alcohol/tobacco use who presents for follow-up of his cardiomyopathy.   His cardiac history dates back to at least 2012 with chart indicating an echo at that time showed an EF of 20 to 25% with global hypokinesis.  Repeat echo in 2013 showed an EF of 50 to 55%.  Echo in 01/2015 showed an EF of 25 to 30% which was a decrease from prior echo in 2015 with an EF of 45 to 50%.  In 01/2015, he underwent diagnostic LHC which showed an occluded proximal RCA to mid RCA along with 30% stenosis of the proximal to mid LAD which was largely unchanged from prior angiography.  His cardiomyopathy was felt to be due to prior alcohol use.  Over the years he has had multiple hospital admissions which have included titration of medications related to hypotension and presyncope.  He was seen in the [facility] CHF clinic in 03/2020 and felt to be euvolemic with no medication changes being made.  He was seen in our office in early 05/2020 and was found to be hypotensive with BP 74/50 with complaints of dizziness.  He was sent to the ED for evaluation of symptomatic hypotension.  He reported a 15 pound weight loss since 02/2020 and had a poor appetite.  It was noted he had previously stopped drinking though had restarted more recently.  In the ED he was treated with IV fluids.  High-sensitivity troponin negative x2.  He was noted to have AKI with an initial serum creatinine 2.37 subsequently improving  with hydration.  Entresto and spironolactone were held in the setting of hypotension with AKI.  During his admission, his BP remained soft leading to the continued holding of Entresto, spironolactone, and beta-blocker.  At discharge, it was recommended carvedilol 3.125 mg twice daily be resumed.  He was seen in hospital follow-up on 06/03/2020 and reported he had been feeling well since his hospital discharge.  Upon reviewing his home medications it was noted he had been taking carvedilol 6.25 mg twice daily with continued soft BP at 110/80.  He had discontinued alcohol again and reported improved appetite.  Given borderline soft BP escalation of GDMT was deferred.  He was felt to be euvolemic.  [name] was subsequently resumed in follow-up.  He was seen in 06/2020 and was doing well from a cardiac perspective.  Primary cardiologist did not recommend resumption of spironolactone due to concerns for volume depletion.  Subsequent labs obtained in 09/2020 (which were recommended in the spring 2022) showed a decline in his renal function with a serum creatinine of 2.06 with baseline running around 1.2-1.5.  It was recommended Entresto be held at that time.  He was referred to nephrology.   He was seen in our office in 10/2020 and was doing well from a cardiac perspective, without symptoms of angina or decompensation.  Dizziness improved following the discontinuation of Entresto.  He was noted to be taking  spironolactone.  He abstained from alcohol.  He smoked 1 pack/day and was not ready to quit.  Given frequent acute on CKD, spironolactone was again discontinued.  He was placed on losartan in place of Entresto secondary to significant dizziness associated with Entresto use.   He had a lengthy hospitalization in 12/2020 (10/9 through 11/17) due to sepsis secondary to ruptured appendicitis status post exploratory laparotomy/ileocecectomy and drainage of intra-abdominal abscess with admission complicated by hemorrhagic  shock due to spontaneous splenic rupture status postsplenectomy as well as acute hypoxic respiratory failure with noted self extubation, acute left upper extremity DVT involving the left brachial vein, and pleural effusion status post ultrasound-guided thoracentesis with 600 cc of exudative fluid removed.  Echo was obtained during that admission and demonstrated an EF of 45 to 50%, global hypokinesis, normal LV diastolic function parameters, normal RV systolic function and ventricular cavity size, mild mitral regurgitation, mild aortic valve sclerosis without evidence of stenosis, and an estimated right atrial pressure of 3 mmHg.   He was last seen in the office on 06/27/2021 and was without symptoms of angina or decompensation.  He reported a significant amount of weight loss in the setting of his admission and was beginning to put some of this back along with an improved appetite.  He had completed anticoagulation therapy with apixaban for left upper extremity DVT.  He was abstaining from alcohol and tobacco use.  Labs obtained at that time showed AKI with a serum creatinine of 1.74 as well as mild hypercalcemia.  It was recommended he discontinue [place] with follow-up labs showing improvement in creatinine and calcium.   He comes in doing very well from a cardiac perspective.  He is without symptoms of angina or decompensation.  He is routinely walking 1 to 2 miles per day without cardiac limitation, however with the increase in heat and humidity this has been somewhat limited.  His appetite has returned back to normal with stabilization of weight back to baseline.  He does not feel like he is holding onto any fluid.  No dizziness, presyncope, or syncope.  No significant lower extremity swelling.  With the holding of Farxiga follow-up labs have showed improvement and normalization of creatinine.  He is watching his salt intake and drinking approximately 2 L of fluid per day.  He does not have any active issues  or concerns at this time.   Labs independently reviewed: 06/2021 - TC 162, TG 191, HDL 34, LDL 89, potassium 4.2, BUN 14, serum creatinine 1.36, albumin 4.2, AST/ALT normal, Hgb 13.9, PLT 326, A1c 8.8 02/2021 - TSH normal  Past Medical History:  Diagnosis Date   Alcohol abuse    Alcoholic cardiomyopathy ([place]) 03/15/2013   a. 05/2019 Echo: EF 35-40%; b. 10/2019 Echo: EF 45-50%.   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 12/2010 Echo: EF 20-25%; b 08/2013 Echo: EF 45-50%; c. 01/2015 Echo: EF 25-30%; d. 07/2016 Echo: EF 35-40%; e. 05/2019 Echo: EF 35-40%; d. 10/2019 Echo: EF 45-50%, mild LVH, mild red RV fxn, mildl dil RA, Triv MR. Mild to  mod Ao Sclerosis w/o stenosis.   Hyperlipidemia    Hypertension    Nonischemic cardiomyopathy (HCC)    Nonobstructive Coronary artery disease    a. 01/2015 Cath: LM nl, LAD 30p/m, LCX nl, RCA 10p/m, RPDA min irregs.   Osteoarthritis    Pneumonia    TIA (transient ischemic attack)    Tobacco abuse    Vision loss    peripherial vision only left eye.Central Retinal  artery occusion     Past Surgical History:  Procedure Laterality Date   BIOPSY  11/25/2019   Procedure: BIOPSY;  Surgeon: Ladene Artist, MD;  Location: [place];  Service: Endoscopy;;   CARDIAC CATHETERIZATION     CARDIAC CATHETERIZATION N/A 01/28/2015   Procedure: Left Heart Cath and Coronary Angiography;  Surgeon: Wellington Hampshire, MD;  Location: [place] [facility];  Service: Cardiovascular;  Laterality: N/A;   COLONOSCOPY     ESOPHAGOGASTRODUODENOSCOPY (EGD) WITH PROPOFOL N/A 11/25/2019   Procedure: ESOPHAGOGASTRODUODENOSCOPY (EGD) WITH PROPOFOL;  Surgeon: Ladene Artist, MD;  Location: [facility]  ENDOSCOPY;  Service: Endoscopy;  Laterality: N/A;   HIP ARTHROPLASTY Right 03/15/2013   Procedure: ARTHROPLASTY BIPOLAR HIP;  Surgeon: [name], MD;  Location: [place];  Service: Orthopedics;  Laterality: Right;   LAPAROSCOPIC APPENDECTOMY N/A 01/01/2021   Procedure: DIAGNOSTIC LAPAROSCOPY; EXPLORATORY LAPAROTOMY, ILEUCECECTOMY, DRAINAGE OF ABDOMINAL ABSCESS;  Surgeon: Georganna Skeans, MD;  Location: [place];  Service: General;  Laterality: N/A;   SPLENECTOMY, TOTAL N/A 01/06/2021   Procedure: SPLENECTOMY;  Surgeon: Jesusita Oka, MD;  Location: [place];  Service: General;  Laterality: N/A;   TOTAL SHOULDER ARTHROPLASTY Right 09/01/2015   Procedure: RIGHT TOTAL SHOULDER ARTHROPLASTY;  Surgeon: [name], MD;  Location: [place];  Service: Orthopedics;  Laterality: Right;    Current Medications: Current Meds  Medication Sig   acetaminophen (TYLENOL) 500 MG tablet Take 2 tablets (1,000 mg total) by mouth every 8 (eight) hours as needed.   albuterol (VENTOLIN HFA) 108 (90 Base) MCG/ACT inhaler Inhale 2 puffs into the lungs every 6 (six) hours as needed for wheezing or shortness of breath.   allopurinol (ZYLOPRIM) 100 MG tablet Take 100 mg by mouth 2 (two) times daily.   atorvastatin (LIPITOR) 20 MG tablet TAKE 1 TABLET BY MOUTH DAILY   Blood Pressure KIT Check b/p once daily. Based on insurance preference   carvedilol (COREG) 6.25 MG tablet TAKE 1 TABLET BY MOUTH TWICE A DAY   cetirizine (ZYRTEC) 10 MG tablet Take 10 mg by mouth daily.   fenofibrate (TRICOR) 145 MG tablet TAKE 1 TABLET BY MOUTH ONCE A DAY   ferrous sulfate 325 (65 FE) MG tablet Take 1 tablet (325 mg total) by mouth daily with breakfast.   fluticasone (FLONASE) 50 MCG/ACT nasal spray Place 1-2 sprays into both nostrils daily as needed for allergies or rhinitis.   hydroxypropyl methylcellulose / hypromellose (ISOPTO TEARS / GONIOVISC) 2.5 % ophthalmic solution Place 1 drop into both eyes 3 (three) times daily as needed for  dry eyes.   magnesium oxide (MAG-OX) 400 MG tablet Take 1 tablet (400 mg total) by mouth daily.   metFORMIN (GLUCOPHAGE) 500 MG tablet TAKE 1 TABLETS BY MOUTH 2 TIMES DAILY WITH A MEAL   multivitamin (ONE-A-DAY MEN'S) TABS tablet Take 1 tablet by mouth daily with breakfast.   Omega-3 Fatty Acids (FISH OIL PO) Take 1 capsule by mouth daily.   pantoprazole (PROTONIX) 40 MG tablet TAKE 1 TABLET BY MOUTH ONCE A DAY  Allergies:   [place] [dapagliflozin]   Social History   Socioeconomic History   Marital status: Single    Spouse name: Not on file   Number of children: 0   Years of education: 12   Highest education level: High school graduate  Occupational History   Occupation: Retired  Tobacco Use   Smoking status: Former    Packs/day: 0.75    Years: 30.00    Total pack years: 22.50    Types: Cigarettes   Smokeless tobacco: Never  Vaping Use   Vaping Use: Never used  Substance and Sexual Activity   Alcohol use: Not Currently    Alcohol/week: 1.0 standard drink of alcohol    Types: 1 Glasses of wine per week    Comment: occasionally   Drug use: Not Currently    Types: Marijuana   Sexual activity: Yes    Birth control/protection: Condom  Other Topics Concern   Not on file  Social History Narrative   Not on file   Social Determinants of Health   Financial Resource Strain: Low Risk  (07/31/2021)   Overall Financial Resource Strain (CARDIA)    Difficulty of Paying Living Expenses: Not hard at all  Food Insecurity: No Food Insecurity (07/31/2021)   Hunger Vital Sign    Worried About Running Out of Food in the Last Year: Never true    [place] in the Last Year: Never true  Transportation Needs: No Transportation Needs (07/31/2021)   PRAPARE - Hydrologist (Medical): No    Lack of Transportation (Non-Medical): No  Physical Activity: Sufficiently Active (07/31/2021)   Exercise Vital Sign    Days of Exercise per Week: 6 days    Minutes of  Exercise per Session: 30 min  Stress: No Stress Concern Present (07/31/2021)   [place]    Feeling of Stress : Not at all  Social Connections: Moderately Integrated (07/31/2021)   Social Connection and Isolation Panel [NHANES]    Frequency of Communication with Friends and Family: More than three times a week    Frequency of Social Gatherings with Friends and Family: More than three times a week    Attends Religious Services: More than 4 times per year    Active Member of [employer] or Organizations: Yes    Attends Music therapist: More than 4 times per year    Marital Status: Never married     Family History:  The patient's family history includes Dementia in his brother; Diabetes in his father, mother, and sister; Hypertension in his father and mother. There is no history of Cancer, Heart disease, Stroke, Colon cancer, Esophageal cancer, Rectal cancer, or Stomach cancer.  ROS:   12 point review of systems is negative unless otherwise noted in the HPI.   EKGs/Labs/Other Studies Reviewed:    Studies reviewed were summarized above. The additional studies were reviewed today:  2D echo 01/07/2021:  1. No change in EF compared to TTE done 11/09/19. Left ventricular  ejection fraction, by estimation, is 45 to 50%. The left ventricle has  mildly decreased function. The left ventricle demonstrates global  hypokinesis. Left ventricular diastolic parameters   were normal.   2. Right ventricular systolic function is normal. The right ventricular  size is normal.   3. The mitral valve is normal in structure. Mild mitral valve  regurgitation. No evidence of mitral stenosis.   4. The aortic valve was not  well visualized. Aortic valve regurgitation  is not visualized. Mild aortic valve sclerosis is present, with no  evidence of aortic valve stenosis.   5. The inferior vena cava is normal in size with greater than 50%   respiratory variability, suggesting right atrial pressure of 3 mmHg. __________  2D echo 10/2019:  1. Compared to echo done in March 2021 EF improved and thickening of MV  and subchordal apparatus more prominent.   2. Left ventricular ejection fraction, by estimation, is 45 to 50%. The  left ventricle has mildly decreased function. The left ventricle  demonstrates regional wall motion abnormalities (see scoring  diagram/findings for description). There is mild left  ventricular hypertrophy. Left ventricular diastolic parameters are  indeterminate.   3. Enlarged basal annulus . Right ventricular systolic function is mildly  reduced. The right ventricular size is mildly enlarged.   4. Right atrial size was mildly dilated.   5. MV is thickened cannot r/o SBE but likely reducndant sub chordal  apparatus consider TEE to further evaluate if suspicion of SBE high . The  mitral valve is abnormal. Trivial mitral valve regurgitation. No evidence  of mitral stenosis.   6. The aortic valve was not well visualized. Aortic valve regurgitation  is not visualized. Mild to moderate aortic valve sclerosis/calcification  is present, without any evidence of aortic stenosis.   7. The inferior vena cava is normal in size with greater than 50%  respiratory variability, suggesting right atrial pressure of 3 mmHg.  __________   2D echo 05/2019: 1. Left ventricular ejection fraction, by estimation, is 35 to 40%. The  left ventricle has moderately decreased function. The left ventricle  demonstrates global hypokinesis. The left ventricular internal cavity size  was mildly dilated. There is mild  left ventricular hypertrophy. Left ventricular diastolic parameters are  consistent with Grade I diastolic dysfunction (impaired relaxation).   2. Right ventricular systolic function is normal. The right ventricular  size is normal. Tricuspid regurgitation signal is inadequate for assessing  PA  pressure. __________   2D echo 07/2016: - Left ventricle: The cavity size was normal. There was mild    concentric hypertrophy. Systolic function was moderately reduced.    The estimated ejection fraction was in the range of 35% to 40%.    Regional wall motion abnormalities cannot be excluded. Doppler    parameters are consistent with abnormal left ventricular    relaxation (grade 1 diastolic dysfunction).  - Mitral valve: Mild prolapse. There was trivial regurgitation.  - Right ventricle: The cavity size was normal. Systolic function    was normal. __________   LHC 01/2015: Prox RCA to Mid RCA lesion, 10% stenosed. Prox LAD to Mid LAD lesion, 30% stenosed.   1. Mild one-vessel coronary artery disease which does not seem to be different from most recent coronary angiography. 2. Moderate to severely reduced LV systolic function by noninvasive angiography. 3. Mildly elevated left ventricular end-diastolic pressure.   Recommendations: The patient has nonischemic cardiomyopathy likely alcohol induced with poor adherence to medical therapy. Continue medical therapy. __________   Carlton Adam MPI 01/2015: Defect 1: There is a medium defect of moderate severity present in the mid anterior, mid anterolateral, apical anterior and apex location. The defect is partially reversible. This could be due to an artifact. However, previous infarct with moderate peri-infarct ischemia cannot be excluded. ST segment depression was noted during stress. This was nondiagnostic due to abnormal baseline. This is a high risk study mainly due to severely reduced ejection fraction  Nuclear stress EF: 28%. this study is overall very suboptimal due to intense GI activity interfering with the inferior wall. correlate clinically. __________   2D echo 01/2015: - Left ventricle: The cavity size was mildly dilated. Systolic    function was severely reduced. The estimated ejection fraction    was in the range of 25% to  30%. Diffuse hypokinesis. Regional    wall motion abnormalities cannot be excluded. Doppler parameters    are consistent with abnormal left ventricular relaxation (grade 1    diastolic dysfunction).  - Left atrium: The atrium was normal in size.  - Right ventricle: Systolic function was normal.  - Pulmonary arteries: Systolic pressure could not be estimated. __________   2D echo 08/2013: - Impressions: Limited study No doppler or M mode    Moderate LVE. No discrete RWMA;s    Diffuse hypokinesis worse in septum and inferior walls    EF 45-50%   Impressions:   - Limited study No doppler or M mode    Moderate LVE. No discrete RWMA;s    Diffuse hypokinesis worse in septum and inferior walls    EF 45-50%  __________   2d echo 08/2013: - Left ventricle: DIfficult apical windows. LVEF not well    visualized in apical windows. LV systolic function appears to be    moderately depressed.l Would recomm repeat evaluation with    contrast to further define wall motion. The cavity size was    mildly dilated. Wall thickness was normal.  __________   2D echo 04/2011: - Left ventricle: The cavity size was normal. There was mild    focal basal andmild concentric hypertrophy. Systolic    function was normal. The estimated ejection fraction was    in the range of 50% to 55%. Basal inferior, posterior and    septal wall hypokinesis. Most of the myocardium has good    wall motion.  - Mitral valve: Anterior leaflet redundancy. Trivial    regurgitation. Mild prolapse of anterior leaflet.  - Right ventricle: Systolic function was normal.  - Pulmonary arteries: Systolic pressure was within the    normal range.   EKG:  EKG is not ordered today.   Recent Labs: 01/23/2021: B Natriuretic Peptide 73.6 01/29/2021: Magnesium 1.6 03/03/2021: TSH 0.41 07/10/2021: ALT 17; BUN 14; Creatinine, Ser 1.36; Hemoglobin 13.9; Platelets 326.0; Potassium 4.2; Sodium 137  Recent Lipid Panel    Component Value  Date/Time   CHOL 162 07/10/2021 1019   CHOL 138 06/18/2017 1425   TRIG 191.0 (H) 07/10/2021 1019   HDL 34.30 (L) 07/10/2021 1019   HDL 67 06/18/2017 1425   CHOLHDL 5 07/10/2021 1019   VLDL 38.2 07/10/2021 1019   LDLCALC 89 07/10/2021 1019   LDLCALC 51 06/18/2017 1425   LDLDIRECT 78.0 03/03/2021 1301    PHYSICAL EXAM:    VS:  BP 134/82 (BP Location: Left Arm, Patient Position: Sitting, Cuff Size: Normal)   Pulse 89   Ht '6\' 4"'  (1.93 m)   Wt 191 lb 2 oz (86.7 kg)   SpO2 97%   BMI 23.26 kg/m   BMI: Body mass index is 23.26 kg/m.  Physical Exam Constitutional:      Appearance: He is well-developed.  HENT:     Head: Normocephalic and atraumatic.  Eyes:     General:        Right eye: No discharge.        Left eye: No discharge.  Neck:     Vascular: No JVD.  Cardiovascular:     Rate and Rhythm: Normal rate and regular rhythm.     Pulses:          Posterior tibial pulses are 2+ on the right side and 2+ on the left side.     Heart sounds: Normal heart sounds, S1 normal and S2 normal. Heart sounds not distant. No midsystolic click and no opening snap. No murmur heard.    No friction rub.  Pulmonary:     Effort: Pulmonary effort is normal. No respiratory distress.     Breath sounds: Normal breath sounds. No decreased breath sounds, wheezing or rales.  Chest:     Chest wall: No tenderness.  Abdominal:     General: There is no distension.  Musculoskeletal:     Cervical back: Normal range of motion.     Right lower leg: No edema.     Left lower leg: No edema.  Skin:    General: Skin is warm and dry.     Nails: There is no clubbing.  Neurological:     Mental Status: He is alert and oriented to person, place, and time.  Psychiatric:        Speech: Speech normal.        Behavior: Behavior normal.        Thought Content: Thought content normal.        Judgment: Judgment normal.     Wt Readings from Last 3 Encounters:  09/29/21 191 lb 2 oz (86.7 kg)  09/17/21 191 lb  (86.6 kg)  07/31/21 190 lb (86.2 kg)     ASSESSMENT & PLAN:   Nonobstructive CAD: No symptoms suggestive of angina.  Continue risk factor modification and primary prevention including current medical therapy.  No plans for ischemic evaluation at this time.  HFrEF secondary to NICM/alcoholic cardiomyopathy: Recent echo demonstrated a stable cardiomyopathy with an EF of 50%.  He appears euvolemic and well compensated with NYHA class I-II symptoms.  No longer on Farxiga, losartan, Entresto, spironolactone secondary to labile renal function and issues with hyperkalemia previously.  Not requiring a standing loop diuretic.  He remains on low-dose carvedilol.  HTN: Blood pressure is well controlled in the office today.  Continue current medical therapy.  CKD stage III: Most recent renal function normalized off SGLT2 inhibitor.  HLD: LDL 78 in 02/2021.  He remains on atorvastatin.  Chronic anemia with history of GI bleed and spontaneous splenic rupture complicated by hemorrhagic shock status post splenectomy: No symptoms concerning for further bleeding.  Appropriately vaccinated in the setting of splenectomy during prior admission.  Substance use: Abstaining.  TIA: No new deficits.  History of left upper extremity DVT: Possibly provoked as PICC line was in place in the left upper extremity.  He has completed therapy with apixaban.  Left-sided pleural effusion: Status post thoracentesis in 12/2020 with labs consistent with exudative effusion.  No evidence of recurrence at this time.    Disposition: F/u with Dr. Fletcher Anon or an APP in 6 months.   Medication Adjustments/Labs and Tests Ordered: Current medicines are reviewed at length with the patient today.  Concerns regarding medicines are outlined above. Medication changes, Labs and Tests ordered today are summarized above and listed in the Patient Instructions accessible in Encounters.   Signed, Christell Faith, PA-C 09/29/2021 11:39 AM     [place] [address] [place], [place] [address] [phone]

## 2021-09-29 ENCOUNTER — Ambulatory Visit (INDEPENDENT_AMBULATORY_CARE_PROVIDER_SITE_OTHER): Payer: Medicare Other | Admitting: Physician Assistant

## 2021-09-29 ENCOUNTER — Encounter: Payer: Self-pay | Admitting: Physician Assistant

## 2021-09-29 VITALS — BP 134/82 | HR 89 | Ht 76.0 in | Wt 191.1 lb

## 2021-09-29 DIAGNOSIS — I5022 Chronic systolic (congestive) heart failure: Secondary | ICD-10-CM | POA: Diagnosis not present

## 2021-09-29 DIAGNOSIS — I2510 Atherosclerotic heart disease of native coronary artery without angina pectoris: Secondary | ICD-10-CM | POA: Diagnosis not present

## 2021-09-29 DIAGNOSIS — Z8719 Personal history of other diseases of the digestive system: Secondary | ICD-10-CM | POA: Diagnosis not present

## 2021-09-29 DIAGNOSIS — E782 Mixed hyperlipidemia: Secondary | ICD-10-CM

## 2021-09-29 DIAGNOSIS — I10 Essential (primary) hypertension: Secondary | ICD-10-CM

## 2021-09-29 DIAGNOSIS — N183 Chronic kidney disease, stage 3 unspecified: Secondary | ICD-10-CM

## 2021-09-29 DIAGNOSIS — E785 Hyperlipidemia, unspecified: Secondary | ICD-10-CM | POA: Diagnosis not present

## 2021-09-29 DIAGNOSIS — G459 Transient cerebral ischemic attack, unspecified: Secondary | ICD-10-CM | POA: Diagnosis not present

## 2021-09-29 DIAGNOSIS — I428 Other cardiomyopathies: Secondary | ICD-10-CM | POA: Diagnosis not present

## 2021-09-29 NOTE — Patient Instructions (Signed)
Medication Instructions:  No changes at this time.   *If you need a refill on your cardiac medications before your next appointment, please call your pharmacy*   Lab Work: None  If you have labs (blood work) drawn today and your tests are completely normal, you will receive your results only by: [place] (if you have MyChart) OR A paper copy in the mail If you have any lab test that is abnormal or we need to change your treatment, we will call you to review the results.   Testing/Procedures: None   Follow-Up: At [facility], you and your health needs are our priority.  As part of our continuing mission to provide you with exceptional heart care, we have created designated Provider Care Teams.  These Care Teams include your primary Cardiologist (physician) and Advanced Practice Providers (APPs -  Physician Assistants and Nurse Practitioners) who all work together to provide you with the care you need, when you need it.   Your next appointment:   6 month(s)  The format for your next appointment:   In Person  Provider:   Kathlyn Sacramento, MD or Christell Faith, PA-C      Important Information About Sugar

## 2021-10-02 ENCOUNTER — Other Ambulatory Visit: Payer: Self-pay | Admitting: Nurse Practitioner

## 2021-10-02 DIAGNOSIS — E1165 Type 2 diabetes mellitus with hyperglycemia: Secondary | ICD-10-CM

## 2021-10-04 ENCOUNTER — Other Ambulatory Visit: Payer: Self-pay | Admitting: Nurse Practitioner

## 2021-10-10 ENCOUNTER — Other Ambulatory Visit: Payer: Self-pay | Admitting: *Deleted

## 2021-10-10 DIAGNOSIS — N189 Chronic kidney disease, unspecified: Secondary | ICD-10-CM

## 2021-10-10 DIAGNOSIS — D539 Nutritional anemia, unspecified: Secondary | ICD-10-CM

## 2021-10-10 NOTE — Addendum Note (Signed)
Addended by: Luella Cook on: 10/10/2021 09:22 PM   Modules accepted: Orders

## 2021-10-11 ENCOUNTER — Inpatient Hospital Stay: Payer: Medicare Other | Attending: Oncology

## 2021-10-11 ENCOUNTER — Inpatient Hospital Stay: Payer: Medicare Other | Admitting: Oncology

## 2021-10-24 ENCOUNTER — Telehealth: Payer: Self-pay | Admitting: Oncology

## 2021-10-24 NOTE — Telephone Encounter (Signed)
pt called in to have appts R/S

## 2021-10-25 ENCOUNTER — Encounter: Payer: Self-pay | Admitting: Family

## 2021-10-25 ENCOUNTER — Ambulatory Visit: Payer: Medicare Other | Attending: Family | Admitting: Family

## 2021-10-25 VITALS — BP 137/80 | HR 93 | Resp 16 | Ht 76.0 in | Wt 194.2 lb

## 2021-10-25 DIAGNOSIS — J45909 Unspecified asthma, uncomplicated: Secondary | ICD-10-CM | POA: Diagnosis not present

## 2021-10-25 DIAGNOSIS — Z515 Encounter for palliative care: Secondary | ICD-10-CM | POA: Insufficient documentation

## 2021-10-25 DIAGNOSIS — N1831 Chronic kidney disease, stage 3a: Secondary | ICD-10-CM | POA: Diagnosis not present

## 2021-10-25 DIAGNOSIS — Z720 Tobacco use: Secondary | ICD-10-CM

## 2021-10-25 DIAGNOSIS — E119 Type 2 diabetes mellitus without complications: Secondary | ICD-10-CM | POA: Insufficient documentation

## 2021-10-25 DIAGNOSIS — E1122 Type 2 diabetes mellitus with diabetic chronic kidney disease: Secondary | ICD-10-CM

## 2021-10-25 DIAGNOSIS — I110 Hypertensive heart disease with heart failure: Secondary | ICD-10-CM | POA: Diagnosis not present

## 2021-10-25 DIAGNOSIS — M109 Gout, unspecified: Secondary | ICD-10-CM | POA: Insufficient documentation

## 2021-10-25 DIAGNOSIS — M1990 Unspecified osteoarthritis, unspecified site: Secondary | ICD-10-CM | POA: Insufficient documentation

## 2021-10-25 DIAGNOSIS — F1010 Alcohol abuse, uncomplicated: Secondary | ICD-10-CM | POA: Diagnosis not present

## 2021-10-25 DIAGNOSIS — E785 Hyperlipidemia, unspecified: Secondary | ICD-10-CM | POA: Diagnosis not present

## 2021-10-25 DIAGNOSIS — I10 Essential (primary) hypertension: Secondary | ICD-10-CM

## 2021-10-25 DIAGNOSIS — I2510 Atherosclerotic heart disease of native coronary artery without angina pectoris: Secondary | ICD-10-CM | POA: Diagnosis not present

## 2021-10-25 DIAGNOSIS — F1090 Alcohol use, unspecified, uncomplicated: Secondary | ICD-10-CM | POA: Insufficient documentation

## 2021-10-25 DIAGNOSIS — I5022 Chronic systolic (congestive) heart failure: Secondary | ICD-10-CM

## 2021-10-25 DIAGNOSIS — F17200 Nicotine dependence, unspecified, uncomplicated: Secondary | ICD-10-CM | POA: Diagnosis not present

## 2021-10-25 NOTE — Patient Instructions (Addendum)
Continuing wearing compression stockings as needed.  Continue weighing daily and call for an overnight weight gain of 3 pounds or more or a weekly weight gain of more than 5 pounds.   Continue low salt low cholesterol diet

## 2021-10-25 NOTE — Progress Notes (Signed)
Patient ID: Travis [name], male    DOB: 07-28-1955, 66 y.o.   MRN: [id number]  HPI  Travis [name] is a 66 y/o male with a history of osteoarthritis, hyperlipidemia, gout, HTN, GI bleed, DM, CAD, anemia, asthma, previous alcohol use, current tobacco use and chronic heart failure.  Echo report from 01/07/21 reviewed and showed an EF of 45-50% along with mild Travis. Echo report from 11/09/19 reviewed and showed an EF of 45-50% along with mild LVH. Echo report from 06/04/19 reviewed and showed an EF of 35-40% along with mild LVH. Reviewed last echo done 08/08/16 which showed an EF of 35-40%. This is an improvement from Travis [name] last echo which was done 01/26/15 and showed an EF of 25-30% along with trivial Travis/ TR. EF had dropped from 2015 when it was 45-50%.   Had a cardiac catheterization done 01/28/15 which showed 10% stenosis in Prox RCA to Mid RCA and 30% stenosis in Prox LAD to Mid LAD which is not much different from previous angiography. Cardiomyopathy felt to be due to previous alcohol use.   Last ED 09/17/21 for contact dermatitis. Had a lengthy admission (39 days) back in October 2022 due to sepsis due to ruptured appendix and abdominal abscess.   Travis [name] presents today with a chief complaint of a follow-up visit. Currently without any complaints and specifically denies any fatigue, dizziness, abdominal distention nor pain, chest pain/pressure, palpitations, pedal edema, shortness of breath.   Not weighing daily but is encouraged to weigh daily every morning and encourages to continue to eat better on low-sodium low cholesterol diet as well as low-purine foods to prevent gout attacks. Travis [name] does not smoke tobacco as Travis [name] quit last October nor drink alcohol.   Doesn't check Travis [name] BP but checks glucose twice daily.   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 05/2019 Echo: EF 35-40%; b. 10/2019 Echo: EF 45-50%.   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 12/2010 Echo: EF 20-25%; b 08/2013 Echo: EF 45-50%; c. 01/2015 Echo: EF 25-30%; d. 07/2016 Echo: EF 35-40%; e. 05/2019 Echo: EF 35-40%; d. 10/2019 Echo: EF 45-50%, mild LVH, mild red RV fxn, mildl dil RA, Triv Travis. Mild to mod Ao Sclerosis w/o stenosis.   Hyperlipidemia    Hypertension    Nonischemic cardiomyopathy (HCC)    Nonobstructive Coronary artery disease    a. 01/2015 Cath: LM nl, LAD 30p/m, LCX nl, RCA 10p/m, RPDA min irregs.   Osteoarthritis    Pneumonia    TIA (transient ischemic attack)    Tobacco abuse    Vision loss    peripherial vision only left eye.Central Retinal  artery occusion    Past Surgical History:  Procedure Laterality Date   BIOPSY  11/25/2019   Procedure: BIOPSY;  Surgeon: Ladene Artist, MD;  Location: [place];  Service: Endoscopy;;   CARDIAC CATHETERIZATION     CARDIAC CATHETERIZATION N/A 01/28/2015   Procedure: Left Heart Cath and Coronary Angiography;  Surgeon: Wellington Hampshire, MD;  Location: [place] [facility];  Service: Cardiovascular;  Laterality: N/A;   COLONOSCOPY     ESOPHAGOGASTRODUODENOSCOPY (EGD) WITH PROPOFOL N/A 11/25/2019   Procedure: ESOPHAGOGASTRODUODENOSCOPY (EGD) WITH PROPOFOL;  Surgeon: Ladene Artist, MD;  Location: [facility] ENDOSCOPY;  Service: Endoscopy;  Laterality: N/A;   HIP ARTHROPLASTY Right 03/15/2013   Procedure: ARTHROPLASTY BIPOLAR HIP;  Surgeon: [name], MD;  Location: [place];  Service: Orthopedics;  Laterality: Right;   LAPAROSCOPIC APPENDECTOMY N/A 01/01/2021   Procedure: DIAGNOSTIC LAPAROSCOPY; EXPLORATORY LAPAROTOMY, ILEUCECECTOMY, DRAINAGE OF ABDOMINAL ABSCESS;  Surgeon: Georganna Skeans, MD;  Location: [place];  Service:  General;  Laterality: N/A;   SPLENECTOMY, TOTAL N/A 01/06/2021   Procedure: SPLENECTOMY;  Surgeon: Jesusita Oka, MD;  Location: [place];  Service: General;  Laterality: N/A;   TOTAL SHOULDER ARTHROPLASTY Right 09/01/2015   Procedure: RIGHT TOTAL SHOULDER ARTHROPLASTY;  Surgeon: [name], MD;  Location: [place];  Service: Orthopedics;  Laterality: Right;   Family History  Problem Relation Age of Onset   Diabetes Mother    Hypertension Mother    Diabetes Father    Hypertension Father    Diabetes Sister    Dementia Brother    Cancer Neg Hx    Heart disease Neg Hx    Stroke Neg Hx    Colon cancer Neg Hx    Esophageal cancer Neg Hx    Rectal cancer Neg Hx    Stomach cancer Neg Hx    Social History   Tobacco Use   Smoking status: Former    Packs/day: 0.75    Years: 30.00    Total pack years: 22.50    Types: Cigarettes   Smokeless tobacco: Never  Substance Use Topics   Alcohol use: Not Currently    Alcohol/week: 1.0 standard drink of alcohol    Types: 1 Glasses of wine per week    Comment: occasionally   Allergies  Allergen Reactions   Farxiga [Dapagliflozin] Other (See Comments)    Reduction in kidney function   Past Medical History:  Diagnosis Date   Alcohol abuse    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 05/2019 Echo: EF 35-40%; b. 10/2019 Echo: EF 45-50%.   Allergy    Asthma    Central retinal artery occlusion of left eye 09/13/13   Chronic anemia    Clotting disorder ([place])    Diabetes mellitus, type 2 ([place])    pt reports Travis [name] DM is gone   GI bleed    15 years ago   Gout    Hemoptysis    secondary to pulmonary edema   HFimpEF (heart failure with improved ejection fraction) ([place])    a. 12/2010 Echo: EF 20-25%; b 08/2013 Echo: EF 45-50%; c. 01/2015 Echo: EF 25-30%; d. 07/2016 Echo: EF 35-40%; e. 05/2019 Echo: EF 35-40%; d. 10/2019 Echo: EF 45-50%, mild LVH, mild red RV fxn, mildl dil RA, Triv Travis. Mild to mod Ao Sclerosis w/o stenosis.   Hyperlipidemia     Hypertension    Nonischemic cardiomyopathy (HCC)    Nonobstructive Coronary artery disease    a. 01/2015 Cath: LM nl, LAD 30p/m, LCX nl, RCA 10p/m, RPDA min irregs.   Osteoarthritis    Pneumonia    TIA (transient ischemic attack)    Tobacco abuse    Vision loss    peripherial vision only left eye.Central Retinal  artery occusion    Past Surgical History:  Procedure Laterality Date   BIOPSY  11/25/2019   Procedure: BIOPSY;  Surgeon: Ladene Artist, MD;  Location: [place];  Service: Endoscopy;;   CARDIAC CATHETERIZATION     CARDIAC CATHETERIZATION N/A 01/28/2015   Procedure: Left Heart Cath and Coronary Angiography;  Surgeon: Wellington Hampshire, MD;  Location: [place] [facility];  Service: Cardiovascular;  Laterality: N/A;   COLONOSCOPY     ESOPHAGOGASTRODUODENOSCOPY (EGD) WITH PROPOFOL N/A 11/25/2019   Procedure: ESOPHAGOGASTRODUODENOSCOPY (EGD) WITH PROPOFOL;  Surgeon: Ladene Artist, MD;  Location: [facility] ENDOSCOPY;  Service: Endoscopy;  Laterality: N/A;   HIP ARTHROPLASTY Right 03/15/2013   Procedure: ARTHROPLASTY BIPOLAR HIP;  Surgeon: [name], MD;  Location: [place];  Service: Orthopedics;  Laterality: Right;   LAPAROSCOPIC APPENDECTOMY N/A 01/01/2021   Procedure: DIAGNOSTIC LAPAROSCOPY; EXPLORATORY LAPAROTOMY, ILEUCECECTOMY, DRAINAGE OF ABDOMINAL ABSCESS;  Surgeon: Georganna Skeans, MD;  Location: [place];  Service: General;  Laterality: N/A;   SPLENECTOMY, TOTAL N/A 01/06/2021   Procedure: SPLENECTOMY;  Surgeon: Jesusita Oka, MD;  Location: [place];  Service: General;  Laterality: N/A;   TOTAL SHOULDER ARTHROPLASTY Right 09/01/2015   Procedure: RIGHT TOTAL SHOULDER ARTHROPLASTY;  Surgeon: [name], MD;  Location: [place];  Service: Orthopedics;  Laterality: Right;   Family History  Problem Relation Age of Onset   Diabetes Mother    Hypertension Mother    Diabetes Father    Hypertension Father    Diabetes Sister    Dementia Brother    Cancer Neg Hx    Heart  disease Neg Hx    Stroke Neg Hx    Colon cancer Neg Hx    Esophageal cancer Neg Hx    Rectal cancer Neg Hx    Stomach cancer Neg Hx    Social History   Tobacco Use   Smoking status: Former    Packs/day: 0.75    Years: 30.00    Total pack years: 22.50    Types: Cigarettes   Smokeless tobacco: Never  Substance Use Topics   Alcohol use: Not Currently    Alcohol/week: 1.0 standard drink of alcohol    Types: 1 Glasses of wine per week    Comment: occasionally   Allergies  Allergen Reactions   Farxiga [Dapagliflozin] Other (See Comments)    Reduction in kidney function    Prior to Admission medications   Medication Sig Start Date End Date Taking? Authorizing Provider  acetaminophen (TYLENOL) 500 MG tablet Take 2 tablets (1,000 mg total) by mouth every 8 (eight) hours as needed. 02/06/21  Yes Ghimire, Henreitta Leber, MD  albuterol (VENTOLIN HFA) 108 (90 Base) MCG/ACT inhaler Inhale 2 puffs into the lungs every 6 (six) hours as needed for wheezing or shortness of breath.   Yes [provider]  allopurinol (ZYLOPRIM) 100 MG tablet Take 100 mg by mouth 2 (two) times daily.   Yes [provider]  atorvastatin (LIPITOR) 20 MG tablet Take 1 tablet (20 mg total) by mouth daily. 03/22/21  Yes [name], NP  Blood Pressure KIT Check b/p once daily. Based on insurance preference 03/03/21  Yes [name], NP  carvedilol (COREG) 6.25 MG tablet Take 6.25 mg by mouth 2 (two) times daily with a meal.   Yes [provider]  cetirizine (ZYRTEC) 10 MG tablet Take 10 mg by mouth daily.   Yes [provider]  fenofibrate (TRICOR) 145 MG tablet Take 145 mg by mouth daily.   Yes [provider]  ferrous sulfate 325 (65 FE) MG tablet Take 1 tablet (325 mg total) by  mouth daily with breakfast. 06/02/20  Yes Baity, Coralie Keens, NP  fluticasone [facility]) 50 MCG/ACT nasal spray Place 1-2 sprays into both nostrils daily as needed for allergies or rhinitis.   Yes  [provider]  hydroxypropyl methylcellulose / hypromellose (ISOPTO TEARS / GONIOVISC) 2.5 % ophthalmic solution Place 1 drop into both eyes 3 (three) times daily as needed for dry eyes.   Yes [provider]  magnesium oxide (MAG-OX) 400 MG tablet Take 1 tablet (400 mg total) by mouth daily. 12/19/20  Yes [name], NP  metFORMIN (GLUCOPHAGE) 500 MG tablet 1 tablet by mouth every morning with food for 7 days. Then 1 tab BID with food after that 07/10/21  Yes Cable, Alyson Locket, NP  multivitamin (ONE-A-DAY MEN'S) TABS tablet Take 1 tablet by mouth daily with breakfast.   Yes [provider]  Omega-3 Fatty Acids (FISH OIL PO) Take 1 capsule by mouth daily.   Yes [provider]  pantoprazole (PROTONIX) 40 MG tablet TAKE 1 TABLET BY MOUTH ONCE A DAY 07/20/21  Yes [name], NP   Review of Systems  Constitutional:  Negative for appetite change and fatigue.  HENT:  Negative for congestion, postnasal drip and sore throat.   Eyes: Negative.   Respiratory:  Negative for cough, chest tightness, shortness of breath and wheezing.   Cardiovascular:  Negative for chest pain, palpitations and leg swelling.  Gastrointestinal:  Negative for abdominal distention and abdominal pain.  Endocrine: Negative.   Genitourinary: Negative.   Musculoskeletal:  Negative for arthralgias and back pain.  Skin: Negative.   Allergic/Immunologic: Negative.   Neurological:  Negative for dizziness and light-headedness.  Hematological:  Negative for adenopathy. Does not bruise/bleed easily.  Psychiatric/Behavioral:  Negative for dysphoric mood and sleep disturbance (sleeping on 2 pillows). The patient is not nervous/anxious.     Vitals:   10/25/21 1002  BP: 137/80  Pulse: 93  Resp: 16  SpO2: 95%  Weight: 194 lb 4 oz (88.1 kg)  Height: _0  (1.93 m)   Wt Readings from Last 3 Encounters:  10/25/21 194 lb 4 oz (88.1 kg)  09/29/21 191 lb 2 oz (86.7 kg)  09/17/21 191 lb (86.6 kg)      Lab Results  Component Value Date   CREATININE 1.36 07/10/2021   CREATININE 1.22 07/05/2021   CREATININE 1.74 (H) 06/27/2021    Physical Exam Vitals and nursing note reviewed.  Constitutional:      Appearance: Normal appearance.  HENT:     Head: Normocephalic and atraumatic.  Cardiovascular:     Rate and Rhythm: Normal rate and regular rhythm.  Pulmonary:     Effort: Pulmonary effort is normal. No respiratory distress.     Breath sounds: No wheezing or rales.  Abdominal:     General: There is no distension.     Palpations: Abdomen is soft.     Tenderness: There is no abdominal tenderness.  Musculoskeletal:        General: No tenderness.     Cervical back: Normal range of motion and neck supple.     Right lower leg: No edema.     Left lower leg: No edema.  Skin:    General: Skin is warm and dry.  Neurological:     General: No focal deficit present.     Mental Status: Travis [name] is alert and oriented to person, place, and time.  Psychiatric:        Mood and Affect: Mood normal.  Behavior: Behavior normal.        Thought Content: Thought content normal.    Assessment & Plan:  1: Chronic heart failure with reduced ejection fraction- - NYHA class I - euvolemic today - not weighing daily but does have working scales; emphasized to weigh daily so that Travis [name] can call for an overnight weight gain of >2 pounds/weekly weight gain of >5 pounds.  - Weight today 194.4 up 3 pounds from last visit here one month ago - reports doing a lot of walking for exercise and strength building - trying to not add salt to Travis [name] food  - Drinking approximately 64 oz of fluid daily - saw cardiology ([place]) 09/29/21 - currently on GDMT of carvedilol  - had been started on farxiga at last visit but renal function had declined so it was stopped - discussed using entresto/MRA being mindful of BP as Travis [name]'s been hypotensive in the past; Travis [name] says that Travis [name]'s taking so many medications now and feels good so would  like to forego adding anything at this time; ok with him to continue to discuss at future visits - palliative care visit done 08/18/21 - BNP 01/23/21 was 73.6  2: HTN- - BP mildly elevated (137/80) - encouraged to check Travis [name] BP a few times/ week and bring BP log to all appointments - BMP from 07/10/21 reviewed and shows sodium 137, potassium 4.2, creatinine 1.36 and GFR 54.62 - saw Travis [name] PCP Charmian Muff) 07/10/21 - saw nephrology Lanora Manis) 11/24/20  3: Tobacco use- - has not smoked anything since being released from Travis [name] lengthy admission last fall - congratulated him and continued cessation discussed  4: Alcohol use- - has not drank any alcohol since admission last fall; says that that scared him - continued cessation discussed  5: DM- - does like to drink koolaide and says that Travis [name] does make and also likes to drink [place] dews - checks blood glucose twice a day morning and evening   Patient did bring Travis [name] medications for review  Return in 4 months, sooner if needed

## 2021-10-31 ENCOUNTER — Encounter: Payer: Self-pay | Admitting: Nurse Practitioner

## 2021-10-31 ENCOUNTER — Ambulatory Visit (INDEPENDENT_AMBULATORY_CARE_PROVIDER_SITE_OTHER)
Admission: RE | Admit: 2021-10-31 | Discharge: 2021-10-31 | Disposition: A | Discharge status: Home / Self Care | Payer: Medicare Other | Source: Ambulatory Visit | Attending: Nurse Practitioner | Admitting: Nurse Practitioner

## 2021-10-31 ENCOUNTER — Ambulatory Visit (INDEPENDENT_AMBULATORY_CARE_PROVIDER_SITE_OTHER): Payer: Medicare Other | Admitting: Nurse Practitioner

## 2021-10-31 VITALS — BP 134/76 | HR 97 | Temp 97.2°F | Resp 12 | Ht 76.0 in | Wt 193.2 lb

## 2021-10-31 DIAGNOSIS — E1165 Type 2 diabetes mellitus with hyperglycemia: Secondary | ICD-10-CM | POA: Diagnosis not present

## 2021-10-31 DIAGNOSIS — M79641 Pain in right hand: Secondary | ICD-10-CM | POA: Diagnosis not present

## 2021-10-31 DIAGNOSIS — Z8739 Personal history of other diseases of the musculoskeletal system and connective tissue: Secondary | ICD-10-CM | POA: Diagnosis not present

## 2021-10-31 DIAGNOSIS — Z23 Encounter for immunization: Secondary | ICD-10-CM

## 2021-10-31 DIAGNOSIS — I10 Essential (primary) hypertension: Secondary | ICD-10-CM

## 2021-10-31 DIAGNOSIS — M1812 Unilateral primary osteoarthritis of first carpometacarpal joint, left hand: Secondary | ICD-10-CM | POA: Diagnosis not present

## 2021-10-31 DIAGNOSIS — M79642 Pain in left hand: Secondary | ICD-10-CM

## 2021-10-31 DIAGNOSIS — M1811 Unilateral primary osteoarthritis of first carpometacarpal joint, right hand: Secondary | ICD-10-CM | POA: Diagnosis not present

## 2021-10-31 DIAGNOSIS — Z9081 Acquired absence of spleen: Secondary | ICD-10-CM | POA: Diagnosis not present

## 2021-10-31 DIAGNOSIS — E782 Mixed hyperlipidemia: Secondary | ICD-10-CM

## 2021-10-31 LAB — COMPREHENSIVE METABOLIC PANEL
ALT: 18 U/L (ref 0–53)
AST: 20 U/L (ref 0–37)
Albumin: 4.6 g/dL (ref 3.5–5.2)
Alkaline Phosphatase: 97 U/L (ref 39–117)
BUN: 18 mg/dL (ref 6–23)
CO2: 26 mEq/L (ref 19–32)
Calcium: 10 mg/dL (ref 8.4–10.5)
Chloride: 98 mEq/L (ref 96–112)
Creatinine, Ser: 1.47 mg/dL (ref 0.40–1.50)
GFR: 49.64 mL/min — ABNORMAL LOW (ref >60.00)
Glucose, Bld: 203 mg/dL — ABNORMAL HIGH (ref 70–99)
Potassium: 4.2 mEq/L (ref 3.5–5.1)
Sodium: 137 mEq/L (ref 135–145)
Total Bilirubin: 0.4 mg/dL (ref 0.2–1.2)
Total Protein: 7.7 g/dL (ref 6.0–8.3)

## 2021-10-31 LAB — CBC
HCT: 41.2 % (ref 39.0–52.0)
Hemoglobin: 13.8 g/dL (ref 13.0–17.0)
MCHC: 33.6 g/dL (ref 30.0–36.0)
MCV: 95.9 fl (ref 78.0–100.0)
Platelets: 359 10*3/uL (ref 150.0–400.0)
RBC: 4.3 Mil/uL (ref 4.22–5.81)
RDW: 15.8 % — ABNORMAL HIGH (ref 11.5–15.5)
WBC: 6.5 10*3/uL (ref 4.0–10.5)

## 2021-10-31 LAB — POCT GLYCOSYLATED HEMOGLOBIN (HGB A1C): Hemoglobin A1C: 8.4 % — AB (ref 4.0–5.6)

## 2021-10-31 LAB — URIC ACID: Uric Acid, Serum: 4.9 mg/dL (ref 4.0–7.8)

## 2021-10-31 MED ORDER — TRAMADOL HCL 50 MG PO TABS: 50.0000 mg | ORAL_TABLET | Freq: Three times a day (TID) | ORAL | 0 refills | Status: AC | PRN

## 2021-10-31 MED ORDER — METFORMIN HCL 500 MG PO TABS: 1000.0000 mg | ORAL_TABLET | Freq: Two times a day (BID) | ORAL | 0 refills | Status: DC

## 2021-10-31 NOTE — Assessment & Plan Note (Signed)
Likely OA.  Will obtain bilateral hand x-rays and check an ANA.  Continue using over-the-counter Tylenol as needed and tramadol sparingly.  Pending results

## 2021-10-31 NOTE — Assessment & Plan Note (Addendum)
Currently on a statin and fenofibrate.  Check lipids last time defer this visit

## 2021-10-31 NOTE — Patient Instructions (Signed)
Nice to see you today Start taking 2 tablets of metformin in the morning and 1 tablet in the evening for a week then increase to 2 tablets in the morning and 2 tabs in the evening. Follow up with me in 3.5 months with me, sooner if you need me

## 2021-10-31 NOTE — Assessment & Plan Note (Signed)
Patient's A1c had a modest reduction today.  Currently maintained on metformin 500 mg twice daily will titrate patient up to metformin 1000 mg twice daily.  New prescription sent to pharmacy today.  Follow-up in 3 and half months for A1c recheck

## 2021-10-31 NOTE — Addendum Note (Signed)
Addended by: Ellamae Sia on: 10/31/2021 04:18 PM   Modules accepted: Orders

## 2021-10-31 NOTE — Assessment & Plan Note (Signed)
Patient is status post splenectomy.  At high risk for infections complete second dose of Bexsero men B today.

## 2021-10-31 NOTE — Assessment & Plan Note (Signed)
History of the same.  On allopurinol 100 mg twice daily.  Patient states recently had a gout flare and increased it to 300 mg daily.  Did warn patient allopurinol can increase gout when in an acute flare.  He can reach out the office and we can always prescribe colchicine as needed.  Patient currently taking allopurinol 100 mg twice daily pending labs today

## 2021-10-31 NOTE — Progress Notes (Signed)
Established Patient Office Visit  Subjective   Patient ID: Travis [name], male    DOB: [birth year]  Age: 66 y.o. MRN: [id number]  Chief Complaint  Patient presents with   Diabetes    Follow up-checks sugar at home but not sure of the exact numbers.      DM2: Checking glucose at home once a week. States they have been below 200.  Been snacking all the time. SOmetimes cookies, chips, pnut butter crackers or PB&J sand. Drinking water tea, juice and gatorade. Most water   Bilateral hands: States that his joints on both hand bother him. Gout in the feet feels different. Some stiffness and soreness. Worse with movement, denies swelling. Tylenol does not help   Hematology: Patient is followed by cancer center in regards to iron levels.  States he missed his last appointment but has one scheduled later this month.  Has appointment approximately every 6 months per patient report.    History (Optional):[id number]}  Review of Systems  Constitutional:  Negative for chills and fever.  Respiratory:  Negative for shortness of breath.   Cardiovascular:  Negative for chest pain.  Gastrointestinal:  Negative for abdominal pain, nausea and vomiting.       BM daily  Musculoskeletal:  Positive for joint pain.  Neurological:  Negative for tingling and headaches.  Psychiatric/Behavioral:  Negative for hallucinations and suicidal ideas.       Objective:     BP 134/76   Pulse 97   Temp (!) 97.2 F (36.2 C)   Resp 12   Ht 6\' 4"  (1.93 m)   Wt 193 lb 4 oz (87.7 kg)   SpO2 96%   BMI 23.52 kg/m  BP Readings from Last 3 Encounters:  10/31/21 134/76  10/25/21 137/80  09/29/21 134/82   Wt Readings from Last 3 Encounters:  10/31/21 193 lb 4 oz (87.7 kg)  10/25/21 194 lb 4 oz (88.1 kg)  09/29/21 191 lb 2 oz (86.7 kg)      Physical Exam Vitals and nursing note reviewed.  Constitutional:      Appearance: Normal appearance.  Cardiovascular:     Rate and Rhythm: Normal rate and regular rhythm.      Heart sounds: Normal heart sounds.  Pulmonary:     Effort: Pulmonary effort is normal.     Breath sounds: Normal breath sounds.  Musculoskeletal:     Right lower leg: No edema.     Left lower leg: No edema.  Lymphadenopathy:     Cervical: No cervical adenopathy.  Neurological:     Mental Status: He is alert.  Psychiatric:        Mood and Affect: Mood normal.        Behavior: Behavior normal.        Thought Content: Thought content normal.        Judgment: Judgment normal.      Results for orders placed or performed in visit on 10/31/21  POCT glycosylated hemoglobin (Hb A1C)  Result Value Ref Range   Hemoglobin A1C 8.4 (A) 4.0 - 5.6 %   HbA1c POC (<> result, manual entry)     HbA1c, POC (prediabetic range)     HbA1c, POC (controlled diabetic range)        The 10-year ASCVD risk score (Arnett DK, et al., 2019) is: 48.6%    Assessment & Plan:   Problem List Items Addressed This Visit       Cardiovascular and Mediastinum   Essential hypertension  Endocrine   Diabetes ([place]) - Primary    Patient's A1c had a modest reduction today.  Currently maintained on metformin 500 mg twice daily will titrate patient up to metformin 1000 mg twice daily.  New prescription sent to pharmacy today.  Follow-up in 3 and half months for A1c recheck      Relevant Medications   metFORMIN (GLUCOPHAGE) 500 MG tablet   Other Relevant Orders   POCT glycosylated hemoglobin (Hb A1C) (Completed)   Microalbumin/Creatinine Ratio, Urine   CBC   Comprehensive metabolic panel     Other   HLD (hyperlipidemia)    Currently on a statin and fenofibrate.  Check lipids last time defer this visit      Post-splenectomy    Patient is status post splenectomy.  At high risk for infections complete second dose of Bexsero men B today.      Relevant Orders   Meningococcal B, OMV (Bexsero) (Completed)   Bilateral hand pain    Likely OA.  Will obtain bilateral hand x-rays and check an ANA.   Continue using over-the-counter Tylenol as needed and tramadol sparingly.  Pending results      Relevant Medications   traMADol (ULTRAM) 50 MG tablet   Other Relevant Orders   ANA   DG Hand 2 View Left (Completed)   DG Hand 2 View Right (Completed)   History of gout    History of the same.  On allopurinol 100 mg twice daily.  Patient states recently had a gout flare and increased it to 300 mg daily.  Did warn patient allopurinol can increase gout when in an acute flare.  He can reach out the office and we can always prescribe colchicine as needed.  Patient currently taking allopurinol 100 mg twice daily pending labs today      Relevant Orders   Uric acid    Return in about 14 weeks (around 02/06/2022) for DM recheck.    Romilda Garret, NP

## 2021-11-01 ENCOUNTER — Other Ambulatory Visit: Payer: Self-pay | Admitting: Nurse Practitioner

## 2021-11-01 ENCOUNTER — Other Ambulatory Visit (INDEPENDENT_AMBULATORY_CARE_PROVIDER_SITE_OTHER): Payer: Medicare Other

## 2021-11-01 DIAGNOSIS — E1165 Type 2 diabetes mellitus with hyperglycemia: Secondary | ICD-10-CM

## 2021-11-01 DIAGNOSIS — M79641 Pain in right hand: Secondary | ICD-10-CM

## 2021-11-01 LAB — MICROALBUMIN / CREATININE URINE RATIO
Creatinine,U: 89.3 mg/dL
Microalb Creat Ratio: 0.8 mg/g (ref 0.0–30.0)
Microalb, Ur: <0.7 mg/dL (ref 0.0–1.9)

## 2021-11-06 ENCOUNTER — Encounter: Payer: Self-pay | Admitting: Nurse Practitioner

## 2021-11-06 ENCOUNTER — Other Ambulatory Visit: Payer: Medicare Other | Admitting: Nurse Practitioner

## 2021-11-06 DIAGNOSIS — E1100 Type 2 diabetes mellitus with hyperosmolarity without nonketotic hyperglycemic-hyperosmolar coma (NKHHC): Secondary | ICD-10-CM

## 2021-11-06 DIAGNOSIS — I509 Heart failure, unspecified: Secondary | ICD-10-CM

## 2021-11-06 DIAGNOSIS — Z515 Encounter for palliative care: Secondary | ICD-10-CM

## 2021-11-06 LAB — ANA: Anti Nuclear Antibody (ANA): NEGATIVE

## 2021-11-06 LAB — TEST AUTHORIZATION 2

## 2021-11-06 LAB — RHEUMATOID FACTOR: Rheumatoid fact SerPl-aCnc: <14 IU/mL (ref <14)

## 2021-11-06 NOTE — Progress Notes (Signed)
    [facility] Consult Note Telephone: [phone]  Fax: [phone]    Date of encounter: 11/06/21 2:01 PM PATIENT NAME: Travis [name] [id number]   [phone] (home)  DOB: 09-25-1955 MRN: [id number] PRIMARY CARE PROVIDER:    [name], NP,  [place] [address] [phone]  REFERRING PROVIDER:   [name], NP [address] [place],  [place] [address] [phone]  RESPONSIBLE PARTY:    Contact Information     Name Relation Home Work Mobile   Peeler,[name] Niece [phone]  [phone]   [name] [phone]  [phone]   Lugene, Beougher   [phone]      I connected with  Travis [name] on 11/06/21 by telephone  as video not available enabled telemedicine application and verified that I am speaking with the correct person using two identifiers.   I discussed the limitations of evaluation and management by telemedicine. The patient expressed understanding and agreed to proceed. Palliative Care was asked to follow this patient by consultation request of  [name], NP to address advance care planning and complex medical decision making. This is a follow up visit ASSESSMENT AND PLAN / RECOMMENDATIONS:  Symptom Management/Plan: ACP: ongoing discussions   2. Diabetes: HAIC elevated; Discussed recent increase in metformin, side effects. Education done on DM   3. CHF:  stable; continue weights, monitor for edema; discussed ras; discussed visits to Cardiology and CHF clinic to continue compliance with appointments  Follow up Palliative Care Visit: Palliative care will continue to follow for complex medical decision making, advance care planning, and clarification of goals. Return12 weeks or prn.  I spent 32 minutes providing this consultation. More than 50% of the time in this consultation was spent in counseling and care  coordination. PPS: 50% Chief Complaint: Follow up palliative consult for complex medical decision making, address goals, manage ongoing symptoms  HISTORY OF PRESENT ILLNESS:  Travis [name] is a 66 y.o. year old male  with  multiple medical problems including HFrEF, CM, CAD, CKD, COPD, asthma, central retinal artery occlusion of left eye, asthma, anemia, DM, HLD, HTN, OA, h/o GI bleed, gout, h/o alcohol abuse, tobacco abuse.I connected with Travis [name] telephonic, telemedicine as video not available. We talked about f/u pc visit, ros, sympotoms, blood sugars, management, nutrition. We talked about how Travis [name] has been feeling, trying to eat better. Weights. We talked about DM at length and CHF in the setting of chronic disease progression. Medications discussed including metformin recent increase dose. We talked about overall improvement, medical goals, code status with wishes for full code, reviewed most form. We talked about purpose of pc visit, f/u scheduled. Emotional support provided. Questions answered.   History obtained from review of EMR, discussion with Travis [name].  I reviewed available labs, medications, imaging, studies and related documents from the EMR.  Records reviewed and summarized above.   ROS 10 point system reviewed all negative except HPI  Physical Exam: deferred Thank you for the opportunity to participate in the care of Travis [name].  The palliative care team will continue to follow. Please call our office at [phone] if we can be of additional assistance.   Naziah Weckerly Ihor Gully, NP

## 2021-11-08 ENCOUNTER — Other Ambulatory Visit: Payer: Self-pay | Admitting: Physician Assistant

## 2021-11-08 ENCOUNTER — Other Ambulatory Visit: Payer: Self-pay | Admitting: Nurse Practitioner

## 2021-11-21 ENCOUNTER — Inpatient Hospital Stay: Payer: Medicare Other

## 2021-11-21 ENCOUNTER — Inpatient Hospital Stay: Payer: Medicare Other | Admitting: Oncology

## 2021-12-20 ENCOUNTER — Inpatient Hospital Stay: Payer: Medicare Other

## 2021-12-20 ENCOUNTER — Inpatient Hospital Stay: Payer: Medicare Other | Attending: Oncology | Admitting: Oncology

## 2021-12-20 ENCOUNTER — Encounter: Payer: Self-pay | Admitting: Oncology

## 2021-12-20 DIAGNOSIS — E1122 Type 2 diabetes mellitus with diabetic chronic kidney disease: Secondary | ICD-10-CM | POA: Diagnosis not present

## 2021-12-20 DIAGNOSIS — D649 Anemia, unspecified: Secondary | ICD-10-CM

## 2021-12-20 DIAGNOSIS — I130 Hypertensive heart and chronic kidney disease with heart failure and stage 1 through stage 4 chronic kidney disease, or unspecified chronic kidney disease: Secondary | ICD-10-CM | POA: Diagnosis not present

## 2021-12-20 DIAGNOSIS — Z87891 Personal history of nicotine dependence: Secondary | ICD-10-CM | POA: Insufficient documentation

## 2021-12-20 DIAGNOSIS — Z79899 Other long term (current) drug therapy: Secondary | ICD-10-CM | POA: Diagnosis not present

## 2021-12-20 DIAGNOSIS — D539 Nutritional anemia, unspecified: Secondary | ICD-10-CM

## 2021-12-20 DIAGNOSIS — N189 Chronic kidney disease, unspecified: Secondary | ICD-10-CM

## 2021-12-20 DIAGNOSIS — E785 Hyperlipidemia, unspecified: Secondary | ICD-10-CM | POA: Insufficient documentation

## 2021-12-20 DIAGNOSIS — Z7984 Long term (current) use of oral hypoglycemic drugs: Secondary | ICD-10-CM | POA: Insufficient documentation

## 2021-12-20 LAB — IRON AND TIBC
Iron: 75 ug/dL (ref 45–182)
Saturation Ratios: 21 % (ref 17.9–39.5)
TIBC: 365 ug/dL (ref 250–450)
UIBC: 290 ug/dL

## 2021-12-20 LAB — CBC WITH DIFFERENTIAL/PLATELET
Abs Immature Granulocytes: 0.02 10*3/uL (ref 0.00–0.07)
Basophils Absolute: 0 10*3/uL (ref 0.0–0.1)
Basophils Relative: 0 %
Eosinophils Absolute: 0.9 10*3/uL — ABNORMAL HIGH (ref 0.0–0.5)
Eosinophils Relative: 10 %
HCT: 41.9 % (ref 39.0–52.0)
Hemoglobin: 13.9 g/dL (ref 13.0–17.0)
Immature Granulocytes: 0 %
Lymphocytes Relative: 45 %
Lymphs Abs: 3.9 10*3/uL (ref 0.7–4.0)
MCH: 31.7 pg (ref 26.0–34.0)
MCHC: 33.2 g/dL (ref 30.0–36.0)
MCV: 95.4 fL (ref 80.0–100.0)
Monocytes Absolute: 0.6 10*3/uL (ref 0.1–1.0)
Monocytes Relative: 7 %
Neutro Abs: 3.2 10*3/uL (ref 1.7–7.7)
Neutrophils Relative %: 38 %
Platelets: 349 10*3/uL (ref 150–400)
RBC: 4.39 MIL/uL (ref 4.22–5.81)
RDW: 15.1 % (ref 11.5–15.5)
WBC: 8.6 10*3/uL (ref 4.0–10.5)
nRBC: 0.3 % — ABNORMAL HIGH (ref 0.0–0.2)

## 2021-12-20 LAB — FERRITIN: Ferritin: 266 ng/mL (ref 24–336)

## 2021-12-20 NOTE — Progress Notes (Signed)
Hematology/Oncology Consult note [facility]  Telephone:([phone] [phone]  Patient Care Team: [name], NP as PCP - General Wellington Hampshire, MD as PCP - Cardiology (Cardiology) Alisa Graff, FNP as Nurse Practitioner (Cardiology) Wellington Hampshire, MD as Consulting Physician (Cardiology)   Name of the patient: Travis [name]  [id number]  08/12/1955   Date of visit: 12/20/21  Diagnosis-normocytic anemia  Chief complaint/ Reason for visit-routine follow-up of normocytic anemia  Heme/Onc history: patient is a 66 year old male with a past medical history significant for hypertension hyperlipidemia CKD referred for pancytopenia.  He also has a history of nonischemic cardiomyopathy and central retinal artery occlusion.  Most recent CBC from 06/02/2020 showed white count of 4.3, H&H of 9.7/29.4 with an MCV of 104 and a platelet count of 22.  Looking back at his prior CBCs patient's white count has been mostly around 4 with an [place] that fluctuates between 1.3-6.  He has had intermittent thrombocytopenia in the past since 2020 when his platelet counts have fluctuated between 100s to 120s but have also been normal sometimes.  Hemoglobin likewise has varied between 7-9 since August 2021 but prior to that he was normal at 12.  Patient was hospitalized for blood loss anemia secondary to nonbleeding prepyloric gastric ulcer as well as gastritis and duodenal erosions.  Patient presently reports mild fatigue but denies other complaints at this time.  Patient was drinking alcohol up until August 2021But quit drinking after his hospitalization in August.    Interval history-patient states that he is doing well overall.  He has not gone back to drinking alcohol.  He feels well and has good energy.He was hospitalized for call bladder surgery last year which was complicated by splenic rupture requiring splenectomy.  ECOG PS- 1 Pain scale- 0   Review of  systems- Review of Systems  Constitutional:  Negative for chills, fever, malaise/fatigue and weight loss.  HENT:  Negative for congestion, ear discharge and nosebleeds.   Eyes:  Negative for blurred vision.  Respiratory:  Negative for cough, hemoptysis, sputum production, shortness of breath and wheezing.   Cardiovascular:  Negative for chest pain, palpitations, orthopnea and claudication.  Gastrointestinal:  Negative for abdominal pain, blood in stool, constipation, diarrhea, heartburn, melena, nausea and vomiting.  Genitourinary:  Negative for dysuria, flank pain, frequency, hematuria and urgency.  Musculoskeletal:  Negative for back pain, joint pain and myalgias.  Skin:  Negative for rash.  Neurological:  Negative for dizziness, tingling, focal weakness, seizures, weakness and headaches.  Endo/Heme/Allergies:  Does not bruise/bleed easily.  Psychiatric/Behavioral:  Negative for depression and suicidal ideas. The patient does not have insomnia.       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 05/2019 Echo: EF 35-40%; b. 10/2019 Echo: EF 45-50%.   Allergy    Asthma    Central retinal artery occlusion of left eye 09/13/13   Chronic anemia    Clotting disorder ([place])    Diabetes mellitus, type 2 ([place])    pt reports his DM is gone   GI bleed    15 years ago   Gout    Hemoptysis    secondary to pulmonary edema   HFimpEF (heart failure with improved ejection fraction) ([place])    a. 12/2010 Echo:  EF 20-25%; b 08/2013 Echo: EF 45-50%; c. 01/2015 Echo: EF 25-30%; d. 07/2016 Echo: EF 35-40%; e. 05/2019 Echo: EF 35-40%; d. 10/2019 Echo: EF 45-50%, mild LVH, mild red RV fxn, mildl dil RA, Triv MR. Mild to mod Ao Sclerosis w/o stenosis.   Hyperlipidemia    Hypertension    Nonischemic cardiomyopathy (HCC)    Nonobstructive  Coronary artery disease    a. 01/2015 Cath: LM nl, LAD 30p/m, LCX nl, RCA 10p/m, RPDA min irregs.   Osteoarthritis    Pneumonia    TIA (transient ischemic attack)    Tobacco abuse    Vision loss    peripherial vision only left eye.Central Retinal  artery occusion      Past Surgical History:  Procedure Laterality Date   BIOPSY  11/25/2019   Procedure: BIOPSY;  Surgeon: Ladene Artist, MD;  Location: [facility];  Service: Endoscopy;;   CARDIAC CATHETERIZATION     CARDIAC CATHETERIZATION N/A 01/28/2015   Procedure: Left Heart Cath and Coronary Angiography;  Surgeon: Wellington Hampshire, MD;  Location: [facility];  Service: Cardiovascular;  Laterality: N/A;   COLONOSCOPY     ESOPHAGOGASTRODUODENOSCOPY (EGD) WITH PROPOFOL N/A 11/25/2019   Procedure: ESOPHAGOGASTRODUODENOSCOPY (EGD) WITH PROPOFOL;  Surgeon: Ladene Artist, MD;  Location: [facility] ENDOSCOPY;  Service: Endoscopy;  Laterality: N/A;   HIP ARTHROPLASTY Right 03/15/2013   Procedure: ARTHROPLASTY BIPOLAR HIP;  Surgeon: [name], MD;  Location: [facility];  Service: Orthopedics;  Laterality: Right;   LAPAROSCOPIC APPENDECTOMY N/A 01/01/2021   Procedure: DIAGNOSTIC LAPAROSCOPY; EXPLORATORY LAPAROTOMY, ILEUCECECTOMY, DRAINAGE OF ABDOMINAL ABSCESS;  Surgeon: Georganna Skeans, MD;  Location: [facility] [place] [facility];  Service: General;  Laterality: N/A;   SPLENECTOMY, TOTAL N/A 01/06/2021   Procedure: SPLENECTOMY;  Surgeon: Jesusita Oka, MD;  Location: [facility];  Service: General;  Laterality: N/A;   TOTAL SHOULDER ARTHROPLASTY Right 09/01/2015   Procedure: RIGHT TOTAL SHOULDER ARTHROPLASTY;  Surgeon: [name], MD;  Location: [place] [facility];  Service: Orthopedics;  Laterality: Right;    Social History   Socioeconomic History   Marital status: Single    Spouse name: Not on file   Number of children: 0   Years of education: 12   Highest education level: High school graduate  Occupational History   Occupation: Retired  Tobacco Use   Smoking  status: Former    Packs/day: 0.75    Years: 30.00    Total pack years: 22.50    Types: Cigarettes    Quit date: 12/2020    Years since quitting: 0.9   Smokeless tobacco: Never  Vaping Use   Vaping Use: Never used  Substance and Sexual Activity   Alcohol use: Not Currently   Drug use: Not Currently   Sexual activity: Yes    Birth control/protection: Condom  Other Topics Concern   Not on file  Social History Narrative   Not on file   Social Determinants of Health   Financial Resource Strain: Low Risk  (07/31/2021)   Overall Financial Resource Strain (CARDIA)    Difficulty of Paying Living Expenses: Not hard at all  Food Insecurity: No Food Insecurity (07/31/2021)   Hunger Vital Sign    Worried About Running Out of Food in the Last Year: Never true    [place] in the Last Year: Never true  Transportation Needs: No Transportation Needs (07/31/2021)   PRAPARE - Hydrologist (Medical): No    Lack of Transportation (Non-Medical): No  Physical Activity: Sufficiently Active (07/31/2021)   Exercise Vital Sign    Days of Exercise per Week: 6 days    Minutes of Exercise per Session: 30 min  Stress: No Stress Concern Present (07/31/2021)   [place]    Feeling of Stress : Not at all  Social Connections: Moderately Integrated (07/31/2021)   Social Connection and Isolation Panel [NHANES]    Frequency of Communication with Friends and Family: More than three times a week    Frequency of Social Gatherings with Friends and Family: More than three times a week    Attends Religious Services: More than 4 times per year    Active Member of [employer] or Organizations: Yes    Attends Music therapist: More than 4 times per year    Marital Status: Never married  Intimate Partner Violence: Not At Risk (07/31/2021)   Humiliation, Afraid, Rape, and Kick questionnaire    Fear of Current or  Ex-Partner: No    Emotionally Abused: No    Physically Abused: No    Sexually Abused: No    Family History  Problem Relation Age of Onset   Diabetes Mother    Hypertension Mother    Diabetes Father    Hypertension Father    Diabetes Sister    Dementia Brother    Cancer Neg Hx    Heart disease Neg Hx    Stroke Neg Hx    Colon cancer Neg Hx    Esophageal cancer Neg Hx    Rectal cancer Neg Hx    Stomach cancer Neg Hx      Current Outpatient Medications:    acetaminophen (TYLENOL) 500 MG tablet, Take 2 tablets (1,000 mg total) by mouth every 8 (eight) hours as needed., Disp: 30 tablet, Rfl: 0   albuterol (VENTOLIN HFA) 108 (90 Base) MCG/ACT inhaler, Inhale 2 puffs into the lungs every 6 (six) hours as needed for wheezing or shortness of breath., Disp: , Rfl:    allopurinol (ZYLOPRIM) 100 MG tablet, TAKE 1 TABLET BY MOUTH TWICE A DAY, Disp: 180 tablet, Rfl: 1   atorvastatin (LIPITOR) 20 MG tablet, TAKE 1 TABLET BY MOUTH DAILY, Disp: 90 tablet, Rfl: 1   Blood Pressure KIT, Check b/p once daily. Based on insurance preference, Disp: 1 kit, Rfl: 0   carvedilol (COREG) 6.25 MG tablet, TAKE 1 TABLET BY MOUTH TWICE A DAY, Disp: 180 tablet, Rfl: 2   cetirizine (ZYRTEC) 10 MG tablet, Take 10 mg by mouth daily., Disp: , Rfl:    fenofibrate (TRICOR) 145 MG tablet, TAKE 1 TABLET BY MOUTH ONCE A DAY, Disp: 90 tablet, Rfl: 1   ferrous sulfate 325 (65 FE) MG tablet, Take 1 tablet (325 mg total) by mouth daily with breakfast., Disp: 90 tablet, Rfl: 2   fluticasone (FLONASE) 50 MCG/ACT nasal spray, Place 1-2 sprays into both nostrils daily as needed for allergies or rhinitis., Disp: , Rfl:    hydroxypropyl methylcellulose / hypromellose (ISOPTO TEARS / GONIOVISC) 2.5 % ophthalmic solution, Place 1 drop into both eyes 3 (three) times daily as needed for dry eyes., Disp: , Rfl:    magnesium oxide (MAG-OX) 400 MG tablet, Take 1 tablet (400 mg total) by mouth daily., Disp: 90 tablet, Rfl: 0   metFORMIN  (GLUCOPHAGE) 500 MG tablet, Take 2 tablets (1,000 mg total) by mouth 2 (two) times daily with a meal., Disp: 360 tablet, Rfl: 0   multivitamin (ONE-A-DAY MEN'S) TABS tablet, Take  1 tablet by mouth daily with breakfast., Disp: , Rfl:    Omega-3 Fatty Acids (FISH OIL PO), Take 1 capsule by mouth daily., Disp: , Rfl:    pantoprazole (PROTONIX) 40 MG tablet, TAKE 1 TABLET BY MOUTH ONCE A DAY, Disp: 90 tablet, Rfl: 1  Physical exam:  Physical Exam Constitutional:      General: He is not in acute distress. Cardiovascular:     Rate and Rhythm: Normal rate and regular rhythm.     Heart sounds: Normal heart sounds.  Pulmonary:     Effort: Pulmonary effort is normal.     Breath sounds: Normal breath sounds.  Abdominal:     General: Bowel sounds are normal.     Palpations: Abdomen is soft.  Skin:    General: Skin is warm and dry.  Neurological:     Mental Status: He is alert and oriented to person, place, and time.         Latest Ref Rng & Units 10/31/2021   10:00 AM  CMP  Glucose 70 - 99 mg/dL 203   BUN 6 - 23 mg/dL 18   Creatinine 0.40 - 1.50 mg/dL 1.47   Sodium 135 - 145 mEq/L 137   Potassium 3.5 - 5.1 mEq/L 4.2   Chloride 96 - 112 mEq/L 98   CO2 19 - 32 mEq/L 26   Calcium 8.4 - 10.5 mg/dL 10.0   Total Protein 6.0 - 8.3 g/dL 7.7   Total Bilirubin 0.2 - 1.2 mg/dL 0.4   Alkaline Phos 39 - 117 U/L 97   AST 0 - 37 U/L 20   ALT 0 - 53 U/L 18       Latest Ref Rng & Units 12/20/2021    2:17 PM  CBC  WBC 4.0 - 10.5 K/uL 8.6   Hemoglobin 13.0 - 17.0 g/dL 13.9   Hematocrit 39.0 - 52.0 % 41.9   Platelets 150 - 400 K/uL 349     No images are attached to the encounter.  No results found.   Assessment and plan- Patient is a 66 y.o. male Here for routine follow-up for anemia  Patient's hemoglobin was down to 8.6 back in November 2022 but since then it has gradually been coming back up.  His hemoglobin has been stable around 13 since April 2023.  White count and platelets are normal.   He therefore does not require any follow-up with me at this time and will continue to follow-up with his primary care provider.  He can be referred to [place] in the future if questions or concerns arise.   Visit Diagnosis 1. Normocytic anemia      Dr. Randa Evens, MD, MPH [name] [phone] 12/20/2021 4:35 PM

## 2022-01-02 ENCOUNTER — Other Ambulatory Visit: Payer: Self-pay | Admitting: Nurse Practitioner

## 2022-01-04 ENCOUNTER — Other Ambulatory Visit: Payer: Self-pay | Admitting: Nurse Practitioner

## 2022-01-08 ENCOUNTER — Other Ambulatory Visit: Payer: Self-pay | Admitting: Nurse Practitioner

## 2022-01-10 ENCOUNTER — Telehealth: Payer: Self-pay | Admitting: Nurse Practitioner

## 2022-01-10 MED ORDER — MAGNESIUM OXIDE 400 MG PO TABS: 400.0000 mg | ORAL_TABLET | Freq: Every day | ORAL | 1 refills | Status: DC

## 2022-01-10 NOTE — Addendum Note (Signed)
Addended by: [name] on: 01/10/2022 03:45 PM   Modules accepted: Orders

## 2022-01-10 NOTE — Telephone Encounter (Signed)
It was ordered a year ago. Has he been taking it every day? If so we can refill 90 days with 1 refill

## 2022-01-10 NOTE — Telephone Encounter (Signed)
Patient advised, follow up appt scheduled 02/08/22

## 2022-01-10 NOTE — Telephone Encounter (Signed)
Patient states he has been taking Magnesium daily for a year. He said if he does not need to take it he is fine with that, 1 less pill he has to take he said.

## 2022-01-10 NOTE — Telephone Encounter (Signed)
Refills were declined that came through. Does patient need to be off the medication?

## 2022-01-10 NOTE — Telephone Encounter (Signed)
He is on protonix which can lower the magnesium. Lets continue it. He also need a follow up scheduled with me. I will send in the magnesium

## 2022-01-10 NOTE — Telephone Encounter (Signed)
Patient is trying to pick up rx magnesium oxide (MAG-OX) 400 MG tablet from pharmacy. Pharmacy said they have sent in several faxes,haven't heard anything back,he would like to know if he was taken off of the medication ,or what's going on, because he's not able to get it.

## 2022-01-12 ENCOUNTER — Other Ambulatory Visit: Payer: Self-pay | Admitting: Nurse Practitioner

## 2022-02-08 ENCOUNTER — Ambulatory Visit (INDEPENDENT_AMBULATORY_CARE_PROVIDER_SITE_OTHER): Payer: Medicare Other | Admitting: Nurse Practitioner

## 2022-02-08 VITALS — BP 138/82 | HR 84 | Temp 97.0°F | Resp 10 | Ht 76.0 in | Wt 197.0 lb

## 2022-02-08 DIAGNOSIS — D500 Iron deficiency anemia secondary to blood loss (chronic): Secondary | ICD-10-CM

## 2022-02-08 DIAGNOSIS — Z760 Encounter for issue of repeat prescription: Secondary | ICD-10-CM

## 2022-02-08 DIAGNOSIS — M545 Low back pain, unspecified: Secondary | ICD-10-CM | POA: Diagnosis not present

## 2022-02-08 DIAGNOSIS — M79642 Pain in left hand: Secondary | ICD-10-CM

## 2022-02-08 DIAGNOSIS — I10 Essential (primary) hypertension: Secondary | ICD-10-CM | POA: Diagnosis not present

## 2022-02-08 DIAGNOSIS — N183 Chronic kidney disease, stage 3 unspecified: Secondary | ICD-10-CM

## 2022-02-08 DIAGNOSIS — E782 Mixed hyperlipidemia: Secondary | ICD-10-CM | POA: Diagnosis not present

## 2022-02-08 DIAGNOSIS — E1165 Type 2 diabetes mellitus with hyperglycemia: Secondary | ICD-10-CM | POA: Diagnosis not present

## 2022-02-08 DIAGNOSIS — Z9081 Acquired absence of spleen: Secondary | ICD-10-CM

## 2022-02-08 DIAGNOSIS — M79641 Pain in right hand: Secondary | ICD-10-CM

## 2022-02-08 DIAGNOSIS — G8929 Other chronic pain: Secondary | ICD-10-CM

## 2022-02-08 LAB — LIPID PANEL
Cholesterol: 141 mg/dL (ref 0–200)
HDL: 33.3 mg/dL — ABNORMAL LOW (ref >39.00)
LDL Cholesterol: 80 mg/dL (ref 0–99)
NonHDL: 107.89
Total CHOL/HDL Ratio: 4
Triglycerides: 141 mg/dL (ref 0.0–149.0)
VLDL: 28.2 mg/dL (ref 0.0–40.0)

## 2022-02-08 LAB — COMPREHENSIVE METABOLIC PANEL
ALT: 21 U/L (ref 0–53)
AST: 24 U/L (ref 0–37)
Albumin: 4.4 g/dL (ref 3.5–5.2)
Alkaline Phosphatase: 87 U/L (ref 39–117)
BUN: 15 mg/dL (ref 6–23)
CO2: 25 mEq/L (ref 19–32)
Calcium: 9.5 mg/dL (ref 8.4–10.5)
Chloride: 107 mEq/L (ref 96–112)
Creatinine, Ser: 1.31 mg/dL (ref 0.40–1.50)
GFR: 56.89 mL/min — ABNORMAL LOW (ref >60.00)
Glucose, Bld: 165 mg/dL — ABNORMAL HIGH (ref 70–99)
Potassium: 4.5 mEq/L (ref 3.5–5.1)
Sodium: 136 mEq/L (ref 135–145)
Total Bilirubin: 0.3 mg/dL (ref 0.2–1.2)
Total Protein: 7.4 g/dL (ref 6.0–8.3)

## 2022-02-08 LAB — CBC
HCT: 40.6 % (ref 39.0–52.0)
Hemoglobin: 13.6 g/dL (ref 13.0–17.0)
MCHC: 33.5 g/dL (ref 30.0–36.0)
MCV: 96 fl (ref 78.0–100.0)
Platelets: 387 10*3/uL (ref 150.0–400.0)
RBC: 4.23 Mil/uL (ref 4.22–5.81)
RDW: 16.3 % — ABNORMAL HIGH (ref 11.5–15.5)
WBC: 6.2 10*3/uL (ref 4.0–10.5)

## 2022-02-08 LAB — POCT GLYCOSYLATED HEMOGLOBIN (HGB A1C): Hemoglobin A1C: 8.3 % — AB (ref 4.0–5.6)

## 2022-02-08 MED ORDER — TRAMADOL HCL 50 MG PO TABS: 50.0000 mg | ORAL_TABLET | Freq: Three times a day (TID) | ORAL | 0 refills | Status: AC | PRN

## 2022-02-08 MED ORDER — CETIRIZINE HCL 10 MG PO TABS: 10.0000 mg | ORAL_TABLET | Freq: Every day | ORAL | 3 refills | Status: DC

## 2022-02-08 MED ORDER — GLIMEPIRIDE 1 MG PO TABS: 1.0000 mg | ORAL_TABLET | Freq: Every day | ORAL | 0 refills | Status: DC

## 2022-02-08 NOTE — Assessment & Plan Note (Signed)
Blood pressure within normal limits.  Patient is on carvedilol 3.125 mg cardiology.  Continue medication as prescribed

## 2022-02-08 NOTE — Assessment & Plan Note (Signed)
Patient was pulled off of antidiabetic medications after being hospitalized.  As of late and his A1c was trending up we did place patient on metformin 1000 mg twice daily.  Patient is only able to handle 1000 mg every morning and 500 mg every afternoon.  Patient was placed on Farxiga from cardiology but had a decrease in kidney function and was pulled off.  We will avoid SGLT2 medications.  Did discuss about a GLP-1 receptor agonist with patient but recently just getting patient back to her normal weight and did not want to have any weight loss currently.  We will put patient on Amaryl 1 mg daily.  Can increase if glucose not adequately controlled.  If patient continues to gain weight we can always consider a GLP-1 receptor agonist

## 2022-02-08 NOTE — Patient Instructions (Signed)
Nice to see you today We added a new pill that you will take in the morning for your sugar Follow up with me in 3 months I will be in touch with the labs once I have the results

## 2022-02-08 NOTE — Assessment & Plan Note (Signed)
Patient is currently vaccinated.  He is dealing with "cold".  Told patient I have very low threshold of treating with antibiotics since he is status post splenectomy.  Patient states he is feeling somewhat better today if he does not continue to improve or starts feeling worse tomorrow he will reach out and we will treat him with antibiotic likely doxycycline 100 mg twice daily for 7 days

## 2022-02-08 NOTE — Progress Notes (Signed)
Established Patient Office Visit  Subjective   Patient ID: Travis [name], male    DOB: 08-30-1955  Age: 66 y.o. 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 Once a week Nothing over 250 States that he is taking 2 in the morning and one in the evening . States that he was placed on garxiga and his kdney function dipped.   HTN Checks it once a week and the top under 140 and bottom under 90.  Patient was on medication in the past.  He did have a hospitalization that resulted in many things as an extended stay and patient was pulled off a blood pressure medicine.  Patient currently maintained with lifestyle modifications and low-dose carvedilol by cardiology.        Review of Systems  Constitutional:  Negative for chills and fever.  HENT:  Positive for ear pain and sore throat (scratchy/ irratied). Negative for congestion, ear discharge and sinus pain.   Respiratory:  Negative for cough and shortness of breath (baseline).   Cardiovascular:  Negative for chest pain.  Gastrointestinal:  Positive for nausea.  Neurological:  Positive for dizziness.      Objective:     BP 138/82   Pulse 84   Temp (!) 97 F (36.1 C)   Resp 10   Ht 6\' 4"  (1.93 m)   Wt 197 lb (89.4 kg)   SpO2 97%   BMI 23.98 kg/m  BP Readings from Last 3 Encounters:  02/08/22 138/82  10/31/21 134/76  10/25/21 137/80   Wt Readings from Last 3 Encounters:  02/08/22 197 lb (89.4 kg)  10/31/21 193 lb 4 oz (87.7 kg)  10/25/21 194 lb 4 oz (88.1 kg)      Physical Exam Constitutional:      Appearance: Normal appearance.  HENT:     Right Ear: Tympanic membrane, ear canal and  external ear normal.     Left Ear: Tympanic membrane, ear canal and external ear normal.     Nose:     Right Sinus: No maxillary sinus tenderness or frontal sinus tenderness.     Left Sinus: No maxillary sinus tenderness or frontal sinus tenderness.     Mouth/Throat:     Mouth: Mucous membranes are moist.     Pharynx: Oropharynx is clear.     Comments: + PND Cardiovascular:     Rate and Rhythm: Normal rate and regular rhythm.     Pulses:          Dorsalis pedis pulses are 1+ on the right side and 1+ on the left side.     Heart sounds: Normal heart sounds.  Pulmonary:     Effort: Pulmonary effort is normal.     Breath sounds: Normal breath sounds.  Abdominal:     General: Bowel sounds are normal.  Musculoskeletal:     Right lower leg: No edema.     Left lower leg: No edema.  Feet:     Right foot:     Toenail Condition: Right toenails are abnormally thick. Fungal disease present.    Left foot:  Skin integrity: Skin breakdown present.     Toenail Condition: Left toenails are abnormally thick. Fungal disease present. Lymphadenopathy:     Cervical: No cervical adenopathy.  Neurological:     Mental Status: He is alert.      Results for orders placed or performed in visit on 02/08/22  POCT glycosylated hemoglobin (Hb A1C)  Result Value Ref Range   Hemoglobin A1C 8.3 (A) 4.0 - 5.6 %   HbA1c POC (<> result, manual entry)     HbA1c, POC (prediabetic range)     HbA1c, POC (controlled diabetic range)        The 10-year ASCVD risk score (Arnett DK, et al., 2019) is: 51.9%    Assessment & Plan:   Problem List Items Addressed This Visit       Cardiovascular and Mediastinum   Essential hypertension    Blood pressure within normal limits.  Patient is on carvedilol 3.125 mg cardiology.  Continue medication as prescribed        Endocrine   Diabetes ([place]) - Primary    Patient was pulled off of antidiabetic medications after being hospitalized.  As of late and his A1c was  trending up we did place patient on metformin 1000 mg twice daily.  Patient is only able to handle 1000 mg every morning and 500 mg every afternoon.  Patient was placed on Farxiga from cardiology but had a decrease in kidney function and was pulled off.  We will avoid SGLT2 medications.  Did discuss about a GLP-1 receptor agonist with patient but recently just getting patient back to her normal weight and did not want to have any weight loss currently.  We will put patient on Amaryl 1 mg daily.  Can increase if glucose not adequately controlled.  If patient continues to gain weight we can always consider a GLP-1 receptor agonist      Relevant Medications   glimepiride (AMARYL) 1 MG tablet   Other Relevant Orders   POCT glycosylated hemoglobin (Hb A1C) (Completed)   CBC   Comprehensive metabolic panel   Lipid panel     Genitourinary   CKD (chronic kidney disease), stage III ([place])    Pending renal function today.        Other   HLD (hyperlipidemia)    Patient currently maintained on atorvastatin 20 mg.  Pending lipid panel today.  Patient is also currently maintained on Tricor      Iron deficiency anemia    Last iron level studies were within normal limits.  Patient still taking over-the-counter ferrous sulfate supplementation and tolerating well      Chronic bilateral low back pain without sciatica    Patient is having a flareup of pain due to the "cold snare".  I have prescribed short courses of tramadol in the past that seems to carry patient.  We will prescribe 15 tablets of tramadol 50 mg every 8 hours as needed severe pain.  PDMP reviewed last fill was in August 2023      Relevant Medications   traMADol (ULTRAM) 50 MG tablet   Post-splenectomy    Patient is currently vaccinated.  He is dealing with "cold".  Told patient I have very low threshold of treating with antibiotics since he is status post splenectomy.  Patient states he is feeling somewhat better today if he does not  continue to improve or starts feeling worse tomorrow he will reach out and we will treat him with antibiotic likely doxycycline 100 mg twice daily  for 7 days      Bilateral hand pain    2 of 2.  Tramadol 50 mg every 8 hours as needed severe pain.      Relevant Medications   traMADol (ULTRAM) 50 MG tablet   Other Visit Diagnoses     Medication refill       Relevant Medications   cetirizine (ZYRTEC) 10 MG tablet       Return in about 3 months (around 05/11/2022) for DM recheck.    Romilda Garret, NP

## 2022-02-08 NOTE — Assessment & Plan Note (Signed)
Patient is having a flareup of pain due to the "cold snare".  I have prescribed short courses of tramadol in the past that seems to carry patient.  We will prescribe 15 tablets of tramadol 50 mg every 8 hours as needed severe pain.  PDMP reviewed last fill was in August 2023

## 2022-02-08 NOTE — Assessment & Plan Note (Signed)
Pending renal function today 

## 2022-02-08 NOTE — Assessment & Plan Note (Signed)
2 of 2.  Tramadol 50 mg every 8 hours as needed severe pain.

## 2022-02-08 NOTE — Assessment & Plan Note (Signed)
Patient currently maintained on atorvastatin 20 mg.  Pending lipid panel today.  Patient is also currently maintained on Tricor

## 2022-02-08 NOTE — Assessment & Plan Note (Signed)
Last iron level studies were within normal limits.  Patient still taking over-the-counter ferrous sulfate supplementation and tolerating well

## 2022-02-13 ENCOUNTER — Telehealth: Payer: Medicare Other | Admitting: Nurse Practitioner

## 2022-02-14 ENCOUNTER — Other Ambulatory Visit: Payer: Self-pay | Admitting: Nurse Practitioner

## 2022-02-14 DIAGNOSIS — E1165 Type 2 diabetes mellitus with hyperglycemia: Secondary | ICD-10-CM

## 2022-03-06 ENCOUNTER — Ambulatory Visit: Payer: Medicare Other | Attending: Family | Admitting: Family

## 2022-03-06 ENCOUNTER — Encounter: Payer: Self-pay | Admitting: Family

## 2022-03-06 VITALS — BP 140/88 | HR 90 | Resp 18 | Wt 199.0 lb

## 2022-03-06 DIAGNOSIS — Z87891 Personal history of nicotine dependence: Secondary | ICD-10-CM | POA: Insufficient documentation

## 2022-03-06 DIAGNOSIS — F1010 Alcohol abuse, uncomplicated: Secondary | ICD-10-CM | POA: Diagnosis not present

## 2022-03-06 DIAGNOSIS — E119 Type 2 diabetes mellitus without complications: Secondary | ICD-10-CM | POA: Diagnosis not present

## 2022-03-06 DIAGNOSIS — N1831 Chronic kidney disease, stage 3a: Secondary | ICD-10-CM

## 2022-03-06 DIAGNOSIS — Z8719 Personal history of other diseases of the digestive system: Secondary | ICD-10-CM | POA: Insufficient documentation

## 2022-03-06 DIAGNOSIS — E1122 Type 2 diabetes mellitus with diabetic chronic kidney disease: Secondary | ICD-10-CM

## 2022-03-06 DIAGNOSIS — Z79899 Other long term (current) drug therapy: Secondary | ICD-10-CM | POA: Insufficient documentation

## 2022-03-06 DIAGNOSIS — F1011 Alcohol abuse, in remission: Secondary | ICD-10-CM | POA: Diagnosis not present

## 2022-03-06 DIAGNOSIS — Z720 Tobacco use: Secondary | ICD-10-CM | POA: Diagnosis not present

## 2022-03-06 DIAGNOSIS — I10 Essential (primary) hypertension: Secondary | ICD-10-CM

## 2022-03-06 DIAGNOSIS — I110 Hypertensive heart disease with heart failure: Secondary | ICD-10-CM | POA: Insufficient documentation

## 2022-03-06 DIAGNOSIS — I5022 Chronic systolic (congestive) heart failure: Secondary | ICD-10-CM

## 2022-03-06 NOTE — Patient Instructions (Addendum)
Resume weighing daily and call for an overnight weight gain of 3 pounds or more or a weekly weight gain of more than 5 pounds.   If you have voicemail, please make sure your mailbox is cleaned out so that we may leave a message and please make sure to listen to any voicemails.    Check about getting RSV vaccine.    Think about starting entresto. We will discuss further at your next visit

## 2022-03-06 NOTE — Progress Notes (Signed)
Patient ID: Travis [name], male    DOB: 09-09-1955, 66 y.o.   MRN: [id number]  HPI  Travis [name] is a 66 y/o male with a history of osteoarthritis, hyperlipidemia, gout, HTN, GI bleed, DM, CAD, anemia, asthma, previous alcohol use, current tobacco use and chronic heart failure.  Echo report from 01/07/21 reviewed and showed an EF of 45-50% along with mild Travis. Echo report from 11/09/19 reviewed and showed an EF of 45-50% along with mild LVH. Echo report from 06/04/19 reviewed and showed an EF of 35-40% along with mild LVH. Reviewed last echo done 08/08/16 which showed an EF of 35-40%. This is an improvement from his last echo which was done 01/26/15 and showed an EF of 25-30% along with trivial Travis/ TR. EF had dropped from 2015 when it was 45-50%.   Had a cardiac catheterization done 01/28/15 which showed 10% stenosis in Prox RCA to Mid RCA and 30% stenosis in Prox LAD to Mid LAD which is not much different from previous angiography. Cardiomyopathy felt to be due to previous alcohol use.   Last ED 09/17/21 for contact dermatitis. Had a lengthy admission (39 days) back in October 2022 due to sepsis due to ruptured appendix and abdominal abscess.   He presents today with a chief complaint of a follow-up visit. He currently has no symptoms and specifically denies any difficulty sleeping, dizziness, abdominal distention, palpitations, pedal edema, chest pain, wheezing, shortness of breath, cough or fatigue.   Has scales at home but hasn't been weighing daily.   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 05/2019 Echo: EF 35-40%; b. 10/2019 Echo: EF 45-50%.   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 12/2010 Echo: EF 20-25%; b 08/2013 Echo: EF 45-50%; c. 01/2015 Echo: EF 25-30%; d. 07/2016 Echo: EF 35-40%; e. 05/2019 Echo: EF 35-40%; d. 10/2019 Echo: EF 45-50%, mild LVH, mild red RV fxn, mildl dil RA, Triv Travis. Mild to mod Ao Sclerosis w/o stenosis.   Hyperlipidemia    Hypertension    Nonischemic cardiomyopathy ([facility])    Nonobstructive Coronary artery disease    a. 01/2015 Cath: LM nl, LAD 30p/m, LCX nl, RCA 10p/m, RPDA min irregs.   Osteoarthritis    Pneumonia    TIA (transient ischemic attack)    Tobacco abuse    Vision loss    peripherial vision only left eye.Central Retinal  artery occusion    Past Surgical History:  Procedure Laterality Date   BIOPSY  11/25/2019   Procedure: BIOPSY;  Surgeon: Ladene Artist, MD;  Location: [facility];  Service: Endoscopy;;   CARDIAC CATHETERIZATION     CARDIAC CATHETERIZATION N/A 01/28/2015   Procedure: Left Heart Cath and Coronary Angiography;  Surgeon: Wellington Hampshire, MD;  Location: [facility];  Service: Cardiovascular;  Laterality: N/A;   COLONOSCOPY     ESOPHAGOGASTRODUODENOSCOPY (EGD) WITH PROPOFOL N/A 11/25/2019   Procedure: ESOPHAGOGASTRODUODENOSCOPY (EGD) WITH PROPOFOL;  Surgeon: Ladene Artist, MD;  Location: [facility];  Service: Endoscopy;  Laterality: N/A;   HIP ARTHROPLASTY Right 03/15/2013   Procedure: ARTHROPLASTY BIPOLAR HIP;  Surgeon: [name], MD;  Location: [facility];  Service: Orthopedics;  Laterality: Right;   LAPAROSCOPIC APPENDECTOMY N/A 01/01/2021   Procedure: DIAGNOSTIC LAPAROSCOPY; EXPLORATORY LAPAROTOMY, ILEUCECECTOMY, DRAINAGE OF ABDOMINAL ABSCESS;  Surgeon: Georganna Skeans, MD;  Location: [facility];  Service: General;  Laterality: N/A;   SPLENECTOMY, TOTAL N/A 01/06/2021   Procedure: SPLENECTOMY;  Surgeon: Jesusita Oka, MD;  Location: [facility];  Service: General;  Laterality: N/A;   TOTAL SHOULDER ARTHROPLASTY Right 09/01/2015   Procedure: RIGHT TOTAL  SHOULDER ARTHROPLASTY;  Surgeon: [name], MD;  Location: [facility];  Service: Orthopedics;  Laterality: Right;   Family History  Problem Relation Age of Onset   Diabetes Mother    Hypertension Mother    Diabetes Father    Hypertension Father    Diabetes Sister    Dementia Brother    Cancer Neg Hx    Heart disease Neg Hx    Stroke Neg Hx    Colon cancer Neg Hx    Esophageal cancer Neg Hx    Rectal cancer Neg Hx    Stomach cancer Neg Hx    Social History   Tobacco Use   Smoking status: Former    Packs/day: 0.75    Years: 30.00    Total pack years: 22.50    Types: Cigarettes    Quit date: 12/2020    Years since quitting: 1.1   Smokeless tobacco: Never  Substance Use Topics   Alcohol use: Not Currently   Allergies  Allergen Reactions   Farxiga [Dapagliflozin] Other (See Comments)    Reduction in kidney function   Prior to Admission medications   Medication Sig Start Date End Date Taking? Authorizing Provider  acetaminophen (TYLENOL) 500 MG tablet Take 2 tablets (1,000 mg total) by mouth every 8 (eight) hours as needed. 02/06/21  Yes Ghimire, Henreitta Leber, MD  albuterol (VENTOLIN HFA) 108 (90 Base) MCG/ACT inhaler Inhale 2 puffs into the lungs every 6 (six) hours as needed for wheezing or shortness of breath.   Yes [provider]  allopurinol (ZYLOPRIM) 100 MG tablet TAKE 1 TABLET BY MOUTH TWICE A DAY 10/05/21  Yes [name], NP  atorvastatin (LIPITOR) 20 MG tablet TAKE 1 TABLET BY MOUTH DAILY 09/04/21  Yes Dutch Quint B, FNP  carvedilol (COREG) 6.25 MG tablet TAKE 1 TABLET BY MOUTH TWICE A DAY 11/08/21  Yes Dunn, Areta Haber, PA-C  cetirizine (ZYRTEC) 10 MG tablet Take 1 tablet (10 mg total) by mouth daily. 02/08/22  Yes [name], NP  fenofibrate (TRICOR) 145 MG tablet TAKE 1 TABLET BY MOUTH ONCE A DAY 11/09/21  Yes [name], NP  ferrous sulfate 325 (65 FE) MG tablet Take 1 tablet (325 mg total) by mouth daily with breakfast. 06/02/20  Yes Baity, Coralie Keens,  NP  fluticasone (FLONASE) 50 MCG/ACT nasal spray Place 1-2 sprays into both nostrils daily as needed for allergies or rhinitis.   Yes [provider]  glimepiride (AMARYL) 1 MG tablet Take 1 tablet (1 mg total) by mouth daily with breakfast. 02/08/22  Yes [name], NP  hydroxypropyl methylcellulose / hypromellose (ISOPTO TEARS / GONIOVISC) 2.5 % ophthalmic solution Place 1 drop into both eyes 3 (three) times daily as needed for dry eyes.   Yes [provider]  magnesium oxide (MAG-OX) 400 MG tablet Take 1 tablet (400 mg total) by mouth daily.  01/10/22  Yes [name], NP  metFORMIN (GLUCOPHAGE) 500 MG tablet TAKE 2 TABLETS BY MOUTH TWICE A DAY WITHA MEAL 02/14/22  Yes [name], NP  multivitamin (ONE-A-DAY MEN'S) TABS tablet Take 1 tablet by mouth daily with breakfast.   Yes [provider]  Omega-3 Fatty Acids (FISH OIL PO) Take 1 capsule by mouth daily. 2000 mg   Yes [provider]  pantoprazole (PROTONIX) 40 MG tablet TAKE 1 TABLET BY MOUTH ONCE A DAY 01/15/22  Yes [name], NP  Blood Pressure KIT Check b/p once daily. Based on insurance preference 03/03/21   [name], NP   Review of Systems  Constitutional:  Negative for appetite change and fatigue.  HENT:  Negative for congestion, postnasal drip and sore throat.   Eyes: Negative.   Respiratory:  Negative for cough, chest tightness, shortness of breath and wheezing.   Cardiovascular:  Negative for chest pain, palpitations and leg swelling.  Gastrointestinal:  Negative for abdominal distention and abdominal pain.  Endocrine: Negative.   Genitourinary: Negative.   Musculoskeletal:  Negative for arthralgias and back pain.  Skin: Negative.   Allergic/Immunologic: Negative.   Neurological:  Negative for dizziness and light-headedness.  Hematological:  Negative for adenopathy. Does not bruise/bleed easily.  Psychiatric/Behavioral:  Negative for dysphoric mood and sleep disturbance  (sleeping on 2 pillows). The patient is not nervous/anxious.    Vitals:   03/06/22 1005  BP: (!) 140/88  Pulse: 90  Resp: 18  SpO2: 100%  Weight: 199 lb (90.3 kg)   Wt Readings from Last 3 Encounters:  03/06/22 199 lb (90.3 kg)  02/08/22 197 lb (89.4 kg)  10/31/21 193 lb 4 oz (87.7 kg)   Lab Results  Component Value Date   CREATININE 1.31 02/08/2022   CREATININE 1.47 10/31/2021   CREATININE 1.36 07/10/2021   Physical Exam Vitals and nursing note reviewed.  Constitutional:      Appearance: Normal appearance.  HENT:     Head: Normocephalic and atraumatic.  Cardiovascular:     Rate and Rhythm: Normal rate and regular rhythm.  Pulmonary:     Effort: Pulmonary effort is normal. No respiratory distress.     Breath sounds: No wheezing or rales.  Abdominal:     General: There is no distension.     Palpations: Abdomen is soft.     Tenderness: There is no abdominal tenderness.  Musculoskeletal:        General: No tenderness.     Cervical back: Normal range of motion and neck supple.     Right lower leg: No edema.     Left lower leg: No edema.  Skin:    General: Skin is warm and dry.  Neurological:     General: No focal deficit present.     Mental Status: He is alert and oriented to person, place, and time.  Psychiatric:        Mood and Affect: Mood normal.        Behavior: Behavior normal.        Thought Content: Thought content normal.    Assessment & Plan:  1: Chronic heart failure with reduced ejection fraction- - NYHA class I - euvolemic today - not weighing daily but does have working scales; emphasized to weigh daily so that he can call for an overnight weight gain of >2 pounds/weekly weight gain of >5 pounds.  - weight up 5 pounds from last visit here 4 months ago - reports doing a  lot of walking for exercise and strength building - cooking with salt and occasionally adds it after the fact to greens and sometimes to meat - Drinking approximately 64 oz of fluid  daily - saw cardiology (Dunn) 09/29/21 - currently on GDMT of carvedilol  - tried farxiga in the past but renal function declined - discussed, again, adding entresto since his BP has been stable; he admits that he's resistant because he "takes so many pills already"; he asks to defer this conversation until he returns in 2 months - palliative care visit done 11/06/21 - BNP 01/23/21 was 73.6 - reports receiving his flu, pneumonia and covid vaccines for this season  2: HTN- - BP mildly elevated (140/88) - checking it sporadically at home but can't say what numbers he is getting; encouraged to write the readings down - BMP from 02/08/22 reviewed and shows sodium 136, potassium 4.5, creatinine 1.31 and GFR 56.89 - saw his PCP Charmian Muff) 02/08/22 - saw nephrology Lanora Manis) 11/24/20  3: Tobacco use- - has not smoked anything since being released from his lengthy admission last fall - congratulated him and continued cessation discussed  4: Alcohol use- - has not drank any alcohol since admission last fall; says that that scared him - continued cessation discussed  5: DM- - checks blood glucose twice a week - A1c from 02/08/22 was 8.3% - metformin gives him GI upset if he eats the "wrong foods"   Medication bottles reviewed.   Return in 2 months, sooner if needed.

## 2022-03-29 NOTE — Progress Notes (Signed)
Cardiology Office Note    Date:  03/30/2022   ID:  JOELLE FLESSNER, DOB [birth year], MRN [id number]  PCP:  [name], NP  Cardiologist:  Kathlyn Sacramento, MD  Electrophysiologist:  None   Chief Complaint: Follow-up  History of Present Illness:   Travis [name] is a 67 y.o. male with history of CAD, alcoholic cardiomyopathy with HFrEF with an LVEF of 45 to 50%, LVH, trivial mitral regurgitation by echo in 10/2019, DM2, TIA, CKD stage III, chronic anemia with pancytopenia, HTN, HLD, gastric ulcer disease with prior GI bleed, and alcohol/tobacco use who presents for follow-up of his cardiomyopathy.   His cardiac history dates back to at least 2012 with chart indicating an echo at that time showed an EF of 20 to 25% with global hypokinesis.  Repeat echo in 2013 showed an EF of 50 to 55%.  Echo in 01/2015 showed an EF of 25 to 30% which was a decrease from prior echo in 2015 with an EF of 45 to 50%.  In 01/2015, he underwent diagnostic LHC which showed an occluded proximal RCA to mid RCA along with 30% stenosis of the proximal to mid LAD which was largely unchanged from prior angiography.  His cardiomyopathy was felt to be due to prior alcohol use.  Over the years, he has had multiple hospital admissions which have included titration of medications related to hypotension and presyncope.  He was seen in the [facility] CHF clinic in 03/2020 and felt to be euvolemic with no medication changes being made.  He was seen in our office in early 05/2020 and was found to be hypotensive with BP 74/50 with complaints of dizziness.  He was sent to the ED for evaluation of symptomatic hypotension.  He reported a 15 pound weight loss since 02/2020 and had a poor appetite.  It was noted he had previously stopped drinking though had restarted.  In the ED he was treated with IV fluids.  High-sensitivity troponin negative x2.  He was noted to have AKI with an initial serum creatinine 2.37 subsequently improving with hydration.   Entresto and spironolactone were held in the setting of hypotension with AKI.  During his admission, his BP remained soft leading to the continued holding of Entresto, spironolactone, and beta-blocker.  At discharge, it was recommended carvedilol 3.125 mg twice daily be resumed.  He was seen in hospital follow-up on 06/03/2020 and reported he had been feeling well since his hospital discharge.  Upon reviewing his home medications it was noted he had been taking carvedilol 6.25 mg twice daily with continued soft BP at 110/80.  He had discontinued alcohol again and reported improved appetite.  Given borderline soft BP escalation of GDMT was deferred.  He was felt to be euvolemic.  [name] was subsequently resumed in follow-up.  He was seen in 06/2020 and was doing well from a cardiac perspective.  Primary cardiologist did not recommend resumption of spironolactone due to concerns for volume depletion.  Subsequent labs obtained in 09/2020 (which were recommended in the spring 2022) showed a decline in his renal function with a serum creatinine of 2.06 with baseline running around 1.2-1.5.  It was recommended Entresto be held at that time.  He was referred to nephrology.   He was seen in our office in 10/2020 and was doing well from a cardiac perspective, without symptoms of angina or decompensation.  Dizziness improved following the discontinuation of Entresto.  He was noted to be taking spironolactone.  He  abstained from alcohol.  He smoked 1 pack/day and was not ready to quit.  Given frequent acute on CKD, spironolactone was again discontinued.  He was placed on losartan in place of Entresto secondary to significant dizziness associated with Entresto use.   He had a lengthy hospitalization in 12/2020 (10/9 through 11/17) due to sepsis secondary to ruptured appendicitis status post exploratory laparotomy/ileocecectomy and drainage of intra-abdominal abscess with admission complicated by hemorrhagic shock due to  spontaneous splenic rupture status postsplenectomy as well as acute hypoxic respiratory failure with noted self extubation, acute left upper extremity DVT involving the left brachial vein, and pleural effusion status post ultrasound-guided thoracentesis with 600 cc of exudative fluid removed.  Echo was obtained during that admission and demonstrated an EF of 45 to 50%, global hypokinesis, normal LV diastolic function parameters, normal RV systolic function and ventricular cavity size, mild mitral regurgitation, mild aortic valve sclerosis without evidence of stenosis, and an estimated right atrial pressure of 3 mmHg.   He was seen in the office on 06/27/2021 and was without symptoms of angina or decompensation.  He reported a significant amount of weight loss in the setting of his admission and was beginning to put some of this back along with an improved appetite.  He had completed anticoagulation therapy with apixaban for left upper extremity DVT.  He was abstaining from alcohol and tobacco use.  Labs obtained at that time showed AKI with a serum creatinine of 1.74 as well as mild hypercalcemia.  It was recommended he discontinue [place] with follow-up labs showing improvement in creatinine and calcium.  He was last seen in the office in 09/2021 and was without symptoms of angina or decompensation.  He was walking 1 to 2 miles per day without cardiac limitation.  His appetite had returned back to normal with stabilization of weight back to baseline.  He comes in doing well from a cardiac perspective and is without symptoms of angina or decompensation.  He continues to live an active lifestyle without cardiac limitation.  He reports a vigorous appetite and is snacking frequently.  In this setting, he has continued to note weight gain.  He does not feel like he is volume overloaded.  No dizziness, presyncope, or syncope.  No lower extremity swelling or progressive orthopnea.   Labs independently  reviewed: 01/2022 - TC 141, TG 141, HDL 33, LDL 80, potassium 4.5, BUN 15, serum creatinine 1.31, albumin 4.4, AST/ALT normal, Hgb 13.6, PLT 387, A1c 8.3 02/2021 - TSH normal  Past Medical History:  Diagnosis Date   Alcohol abuse    Alcoholic cardiomyopathy ([place]) 03/15/2013   a. 05/2019 Echo: EF 35-40%; b. 10/2019 Echo: EF 45-50%.   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 12/2010 Echo: EF 20-25%; b 08/2013 Echo: EF 45-50%; c. 01/2015 Echo: EF 25-30%; d. 07/2016 Echo: EF 35-40%; e. 05/2019 Echo: EF 35-40%; d. 10/2019 Echo: EF 45-50%, mild LVH, mild red RV fxn, mildl dil RA, Triv MR. Mild to mod Ao Sclerosis w/o stenosis.   Hyperlipidemia    Hypertension  Nonischemic cardiomyopathy (HCC)    Nonobstructive Coronary artery disease    a. 01/2015 Cath: LM nl, LAD 30p/m, LCX nl, RCA 10p/m, RPDA min irregs.   Osteoarthritis    Pneumonia    TIA (transient ischemic attack)    Tobacco abuse    Vision loss    peripherial vision only left eye.Central Retinal  artery occusion     Past Surgical History:  Procedure Laterality Date   BIOPSY  11/25/2019   Procedure: BIOPSY;  Surgeon: Ladene Artist, MD;  Location: [place];  Service: Endoscopy;;   CARDIAC CATHETERIZATION     CARDIAC CATHETERIZATION N/A 01/28/2015   Procedure: Left Heart Cath and Coronary Angiography;  Surgeon: Wellington Hampshire, MD;  Location: [place] [facility];  Service: Cardiovascular;  Laterality: N/A;   COLONOSCOPY     ESOPHAGOGASTRODUODENOSCOPY (EGD) WITH PROPOFOL N/A 11/25/2019   Procedure: ESOPHAGOGASTRODUODENOSCOPY (EGD) WITH PROPOFOL;  Surgeon: Ladene Artist, MD;  [facility] END[facility];  Service: Endoscopy;  Laterality: N/A;    HIP ARTHROPLASTY Right 03/15/2013   Procedure: ARTHROPLASTY BIPOLAR HIP;  Surgeon: [name], MD;  Location: [place];  Service: Orthopedics;  Laterality: Right;   LAPAROSCOPIC APPENDECTOMY N/A 01/01/2021   Procedure: DIAGNOSTIC LAPAROSCOPY; EXPLORATORY LAPAROTOMY, ILEUCECECTOMY, DRAINAGE OF ABDOMINAL ABSCESS;  Surgeon: Georganna Skeans, MD;  Location: [place];  Service: General;  Laterality: N/A;   SPLENECTOMY, TOTAL N/A 01/06/2021   Procedure: SPLENECTOMY;  Surgeon: Jesusita Oka, MD;  Location: [place];  Service: General;  Laterality: N/A;   TOTAL SHOULDER ARTHROPLASTY Right 09/01/2015   Procedure: RIGHT TOTAL SHOULDER ARTHROPLASTY;  Surgeon: [name], MD;  Location: [place];  Service: Orthopedics;  Laterality: Right;    Current Medications: Current Meds  Medication Sig   acetaminophen (TYLENOL) 500 MG tablet Take 2 tablets (1,000 mg total) by mouth every 8 (eight) hours as needed.   albuterol (VENTOLIN HFA) 108 (90 Base) MCG/ACT inhaler Inhale 2 puffs into the lungs every 6 (six) hours as needed for wheezing or shortness of breath.   allopurinol (ZYLOPRIM) 100 MG tablet TAKE 1 TABLET BY MOUTH TWICE A DAY   atorvastatin (LIPITOR) 20 MG tablet TAKE 1 TABLET BY MOUTH DAILY   Blood Pressure KIT Check b/p once daily. Based on insurance preference   carvedilol (COREG) 6.25 MG tablet TAKE 1 TABLET BY MOUTH TWICE A DAY   cetirizine (ZYRTEC) 10 MG tablet Take 1 tablet (10 mg total) by mouth daily.   fenofibrate (TRICOR) 145 MG tablet TAKE 1 TABLET BY MOUTH ONCE A DAY   ferrous sulfate 325 (65 FE) MG tablet Take 1 tablet (325 mg total) by mouth daily with breakfast.   fluticasone (FLONASE) 50 MCG/ACT nasal spray Place 1-2 sprays into both nostrils daily as needed for allergies or rhinitis.   glimepiride (AMARYL) 1 MG tablet Take 1 tablet (1 mg total) by mouth daily with breakfast.   hydroxypropyl methylcellulose / hypromellose (ISOPTO TEARS / GONIOVISC) 2.5 % ophthalmic solution Place 1  drop into both eyes 3 (three) times daily as needed for dry eyes.   magnesium oxide (MAG-OX) 400 MG tablet Take 1 tablet (400 mg total) by mouth daily.   metFORMIN (GLUCOPHAGE) 500 MG tablet TAKE 2 TABLETS BY MOUTH TWICE A DAY WITHA MEAL   multivitamin (ONE-A-DAY MEN'S) TABS tablet Take 1 tablet by mouth daily with breakfast.   Omega-3 Fatty Acids (FISH OIL PO) Take 1 capsule by mouth daily. 2000 mg   pantoprazole (PROTONIX) 40 MG tablet TAKE 1 TABLET BY MOUTH  ONCE A DAY    Allergies:   [place] [dapagliflozin]   Social History   Socioeconomic History   Marital status: Single    Spouse name: Not on file   Number of children: 0   Years of education: 12   Highest education level: High school graduate  Occupational History   Occupation: Retired  Tobacco Use   Smoking status: Former    Packs/day: 0.75    Years: 30.00    Total pack years: 22.50    Types: Cigarettes    Quit date: 12/2020    Years since quitting: 1.2   Smokeless tobacco: Never  Vaping Use   Vaping Use: Never used  Substance and Sexual Activity   Alcohol use: Not Currently   Drug use: Not Currently   Sexual activity: Yes    Birth control/protection: Condom  Other Topics Concern   Not on file  Social History Narrative   Not on file   Social Determinants of Health   Financial Resource Strain: Low Risk  (07/31/2021)   Overall Financial Resource Strain (CARDIA)    Difficulty of Paying Living Expenses: Not hard at all  Food Insecurity: No Food Insecurity (07/31/2021)   Hunger Vital Sign    Worried About Running Out of Food in the Last Year: Never true    [place] in the Last Year: Never true  Transportation Needs: No Transportation Needs (07/31/2021)   PRAPARE - Hydrologist (Medical): No    Lack of Transportation (Non-Medical): No  Physical Activity: Sufficiently Active (07/31/2021)   Exercise Vital Sign    Days of Exercise per Week: 6 days    Minutes of Exercise per Session: 30  min  Stress: No Stress Concern Present (07/31/2021)   [place]    Feeling of Stress : Not at all  Social Connections: Moderately Integrated (07/31/2021)   Social Connection and Isolation Panel [NHANES]    Frequency of Communication with Friends and Family: More than three times a week    Frequency of Social Gatherings with Friends and Family: More than three times a week    Attends Religious Services: More than 4 times per year    Active Member of [employer] or Organizations: Yes    Attends Music therapist: More than 4 times per year    Marital Status: Never married     Family History:  The patient's family history includes Dementia in his brother; Diabetes in his father, mother, and sister; Hypertension in his father and mother. There is no history of Cancer, Heart disease, Stroke, Colon cancer, Esophageal cancer, Rectal cancer, or Stomach cancer.  ROS:   12-point review of systems is negative unless otherwise noted in the HPI.   EKGs/Labs/Other Studies Reviewed:    Studies reviewed were summarized above. The additional studies were reviewed today:  2D echo 01/07/2021:  1. No change in EF compared to TTE done 11/09/19. Left ventricular  ejection fraction, by estimation, is 45 to 50%. The left ventricle has  mildly decreased function. The left ventricle demonstrates global  hypokinesis. Left ventricular diastolic parameters   were normal.   2. Right ventricular systolic function is normal. The right ventricular  size is normal.   3. The mitral valve is normal in structure. Mild mitral valve  regurgitation. No evidence of mitral stenosis.   4. The aortic valve was not well visualized. Aortic valve regurgitation  is not visualized. Mild aortic  valve sclerosis is present, with no  evidence of aortic valve stenosis.   5. The inferior vena cava is normal in size with greater than 50%  respiratory variability,  suggesting right atrial pressure of 3 mmHg. __________   2D echo 10/2019:  1. Compared to echo done in March 2021 EF improved and thickening of MV  and subchordal apparatus more prominent.   2. Left ventricular ejection fraction, by estimation, is 45 to 50%. The  left ventricle has mildly decreased function. The left ventricle  demonstrates regional wall motion abnormalities (see scoring  diagram/findings for description). There is mild left  ventricular hypertrophy. Left ventricular diastolic parameters are  indeterminate.   3. Enlarged basal annulus . Right ventricular systolic function is mildly  reduced. The right ventricular size is mildly enlarged.   4. Right atrial size was mildly dilated.   5. MV is thickened cannot r/o SBE but likely reducndant sub chordal  apparatus consider TEE to further evaluate if suspicion of SBE high . The  mitral valve is abnormal. Trivial mitral valve regurgitation. No evidence  of mitral stenosis.   6. The aortic valve was not well visualized. Aortic valve regurgitation  is not visualized. Mild to moderate aortic valve sclerosis/calcification  is present, without any evidence of aortic stenosis.   7. The inferior vena cava is normal in size with greater than 50%  respiratory variability, suggesting right atrial pressure of 3 mmHg.  __________   2D echo 05/2019: 1. Left ventricular ejection fraction, by estimation, is 35 to 40%. The  left ventricle has moderately decreased function. The left ventricle  demonstrates global hypokinesis. The left ventricular internal cavity size  was mildly dilated. There is mild  left ventricular hypertrophy. Left ventricular diastolic parameters are  consistent with Grade I diastolic dysfunction (impaired relaxation).   2. Right ventricular systolic function is normal. The right ventricular  size is normal. Tricuspid regurgitation signal is inadequate for assessing  PA pressure. __________   2D echo 07/2016: -  Left ventricle: The cavity size was normal. There was mild    concentric hypertrophy. Systolic function was moderately reduced.    The estimated ejection fraction was in the range of 35% to 40%.    Regional wall motion abnormalities cannot be excluded. Doppler    parameters are consistent with abnormal left ventricular    relaxation (grade 1 diastolic dysfunction).  - Mitral valve: Mild prolapse. There was trivial regurgitation.  - Right ventricle: The cavity size was normal. Systolic function    was normal. __________   LHC 01/2015: Prox RCA to Mid RCA lesion, 10% stenosed. Prox LAD to Mid LAD lesion, 30% stenosed.   1. Mild one-vessel coronary artery disease which does not seem to be different from most recent coronary angiography. 2. Moderate to severely reduced LV systolic function by noninvasive angiography. 3. Mildly elevated left ventricular end-diastolic pressure.   Recommendations: The patient has nonischemic cardiomyopathy likely alcohol induced with poor adherence to medical therapy. Continue medical therapy. __________   [facility] MPI 01/2015: Defect 1: There is a medium defect of moderate severity present in the mid anterior, mid anterolateral, apical anterior and apex location. The defect is partially reversible. This could be due to an artifact. However, previous infarct with moderate peri-infarct ischemia cannot be excluded. ST segment depression was noted during stress. This was nondiagnostic due to abnormal baseline. This is a high risk study mainly due to severely reduced ejection fraction Nuclear stress EF: 28%. this study is overall very suboptimal  due to intense GI activity interfering with the inferior wall. correlate clinically. __________   2D echo 01/2015: - Left ventricle: The cavity size was mildly dilated. Systolic    function was severely reduced. The estimated ejection fraction    was in the range of 25% to 30%. Diffuse hypokinesis. Regional    wall  motion abnormalities cannot be excluded. Doppler parameters    are consistent with abnormal left ventricular relaxation (grade 1    diastolic dysfunction).  - Left atrium: The atrium was normal in size.  - Right ventricle: Systolic function was normal.  - Pulmonary arteries: Systolic pressure could not be estimated. __________   2D echo 08/2013: - Impressions: Limited study No doppler or M mode    Moderate LVE. No discrete RWMA;s    Diffuse hypokinesis worse in septum and inferior walls    EF 45-50%   Impressions:   - Limited study No doppler or M mode    Moderate LVE. No discrete RWMA;s    Diffuse hypokinesis worse in septum and inferior walls    EF 45-50%  __________   2d echo 08/2013: - Left ventricle: DIfficult apical windows. LVEF not well    visualized in apical windows. LV systolic function appears to be    moderately depressed.l Would recomm repeat evaluation with    contrast to further define wall motion. The cavity size was    mildly dilated. Wall thickness was normal.  __________   2D echo 04/2011: - Left ventricle: The cavity size was normal. There was mild    focal basal andmild concentric hypertrophy. Systolic    function was normal. The estimated ejection fraction was    in the range of 50% to 55%. Basal inferior, posterior and    septal wall hypokinesis. Most of the myocardium has good    wall motion.  - Mitral valve: Anterior leaflet redundancy. Trivial    regurgitation. Mild prolapse of anterior leaflet.  - Right ventricle: Systolic function was normal.  - Pulmonary arteries: Systolic pressure was within the    normal range.    EKG:  EKG is ordered today.  The EKG ordered today demonstrates NSR, 76 bpm, left anterior fascicular block, nonspecific inferolateral ST-T changes with lateral changes improved from prior tracing  Recent Labs: 02/08/2022: ALT 21; BUN 15; Creatinine, Ser 1.31; Hemoglobin 13.6; Platelets 387.0; Potassium 4.5; Sodium 136  Recent  Lipid Panel    Component Value Date/Time   CHOL 141 02/08/2022 0952   CHOL 138 06/18/2017 1425   TRIG 141.0 02/08/2022 0952   HDL 33.30 (L) 02/08/2022 0952   HDL 67 06/18/2017 1425   CHOLHDL 4 02/08/2022 0952   VLDL 28.2 02/08/2022 0952   LDLCALC 80 02/08/2022 0952   LDLCALC 51 06/18/2017 1425   LDLDIRECT 78.0 03/03/2021 1301    PHYSICAL EXAM:    VS:  BP (!) 148/92 (BP Location: Left Arm, Patient Position: Sitting, Cuff Size: Large)   Pulse 76   Ht 6\' 4"  (1.93 m)   Wt 201 lb (91.2 kg)   SpO2 95%   BMI 24.47 kg/m   BMI: Body mass index is 24.47 kg/m.  Physical Exam Vitals reviewed.  Constitutional:      Appearance: He is well-developed.  HENT:     Head: Normocephalic and atraumatic.  Eyes:     General:        Right eye: No discharge.        Left eye: No discharge.  Neck:     Vascular:  No JVD.  Cardiovascular:     Rate and Rhythm: Normal rate and regular rhythm.     Pulses:          Posterior tibial pulses are 2+ on the right side and 2+ on the left side.     Heart sounds: Normal heart sounds, S1 normal and S2 normal. Heart sounds not distant. No midsystolic click and no opening snap. No murmur heard.    No friction rub.  Pulmonary:     Effort: Pulmonary effort is normal. No respiratory distress.     Breath sounds: Normal breath sounds. No decreased breath sounds, wheezing or rales.  Chest:     Chest wall: No tenderness.  Abdominal:     General: There is no distension.  Musculoskeletal:     Cervical back: Normal range of motion.     Right lower leg: No edema.     Left lower leg: No edema.  Skin:    General: Skin is warm and dry.     Nails: There is no clubbing.  Neurological:     Mental Status: He is alert and oriented to person, place, and time.  Psychiatric:        Speech: Speech normal.        Behavior: Behavior normal.        Thought Content: Thought content normal.        Judgment: Judgment normal.     Wt Readings from Last 3 Encounters:   03/30/22 201 lb (91.2 kg)  03/06/22 199 lb (90.3 kg)  02/08/22 197 lb (89.4 kg)     ASSESSMENT & PLAN:   Nonobstructive CAD: He is doing well and without symptoms suggestive of angina.  Continue risk factor modification and primary prevention including current medical therapy.  No plans for ischemic evaluation at this time.  HFrEF secondary to NICM/alcoholic cardiomyopathy: He appears euvolemic and well compensated with recent echo demonstrating stable cardiomyopathy with an EF of 50%.  NYHA class I-II symptoms.  No longer on Farxiga, losartan or Entresto, and spironolactone secondary to labile renal function and issues with hyperkalemia previously.  Not requiring a standing loop diuretic.  He remains on a low-dose carvedilol.  CHF education.  HTN: Blood pressure is mildly elevated in the office today, though typically well-controlled.  Continue medical therapy as outlined above.  HLD: LDL 80 in 01/2022, with normal AST/ALT at that time.  He remains on atorvastatin.  CKD stage III: Stable on recent check.  Chronic anemia with history of GI bleed and spontaneous splenic rupture complicated by hemorrhagic shock status post splenectomy: No symptoms concerning for further bleeding.  Appropriately vaccinated in the setting of splenectomy during prior admission.  Substance use: Abstaining.  TIA: No new deficits.  History of left upper extremity DVT: Possibly provoked as PICC line was in place in the left upper extremity.  He has completed therapy with apixaban.    Disposition: F/u with Dr. Fletcher Anon or an APP in 4 months.   Medication Adjustments/Labs and Tests Ordered: Current medicines are reviewed at length with the patient today.  Concerns regarding medicines are outlined above. Medication changes, Labs and Tests ordered today are summarized above and listed in the Patient Instructions accessible in Encounters.   Signed, Christell Faith, PA-C 03/30/2022 2:04 PM     [address] [place], [place] [address] [phone]

## 2022-03-30 ENCOUNTER — Ambulatory Visit: Payer: Medicare Other | Attending: Physician Assistant | Admitting: Physician Assistant

## 2022-03-30 ENCOUNTER — Encounter: Payer: Self-pay | Admitting: Physician Assistant

## 2022-03-30 VITALS — BP 148/92 | HR 76 | Ht 76.0 in | Wt 201.0 lb

## 2022-03-30 DIAGNOSIS — Z8719 Personal history of other diseases of the digestive system: Secondary | ICD-10-CM

## 2022-03-30 DIAGNOSIS — I2510 Atherosclerotic heart disease of native coronary artery without angina pectoris: Secondary | ICD-10-CM

## 2022-03-30 DIAGNOSIS — S3609XA Other injury of spleen, initial encounter: Secondary | ICD-10-CM

## 2022-03-30 DIAGNOSIS — I428 Other cardiomyopathies: Secondary | ICD-10-CM

## 2022-03-30 DIAGNOSIS — N183 Chronic kidney disease, stage 3 unspecified: Secondary | ICD-10-CM

## 2022-03-30 DIAGNOSIS — I10 Essential (primary) hypertension: Secondary | ICD-10-CM

## 2022-03-30 DIAGNOSIS — D649 Anemia, unspecified: Secondary | ICD-10-CM

## 2022-03-30 DIAGNOSIS — S3609XS Other injury of spleen, sequela: Secondary | ICD-10-CM | POA: Diagnosis not present

## 2022-03-30 DIAGNOSIS — I5022 Chronic systolic (congestive) heart failure: Secondary | ICD-10-CM | POA: Diagnosis not present

## 2022-03-30 DIAGNOSIS — G459 Transient cerebral ischemic attack, unspecified: Secondary | ICD-10-CM | POA: Diagnosis not present

## 2022-03-30 DIAGNOSIS — Z86718 Personal history of other venous thrombosis and embolism: Secondary | ICD-10-CM | POA: Diagnosis not present

## 2022-03-30 DIAGNOSIS — E782 Mixed hyperlipidemia: Secondary | ICD-10-CM | POA: Diagnosis not present

## 2022-03-30 NOTE — Patient Instructions (Signed)
Medication Instructions:  No changes at this time.   *If you need a refill on your cardiac medications before your next appointment, please call your pharmacy*   Lab Work: None  If you have labs (blood work) drawn today and your tests are completely normal, you will receive your results only by: [place] (if you have MyChart) OR A paper copy in the mail If you have any lab test that is abnormal or we need to change your treatment, we will call you to review the results.   Testing/Procedures: None   Follow-Up: At [facility], you and your health needs are our priority.  As part of our continuing mission to provide you with exceptional heart care, we have created designated Provider Care Teams.  These Care Teams include your primary Cardiologist (physician) and Advanced Practice Providers (APPs -  Physician Assistants and Nurse Practitioners) who all work together to provide you with the care you need, when you need it.   Your next appointment:   4 month(s)  The format for your next appointment:   In Person  Provider:   Kathlyn Sacramento, MD or Christell Faith, PA-C       Important Information About Sugar

## 2022-04-03 ENCOUNTER — Other Ambulatory Visit: Payer: Self-pay | Admitting: Nurse Practitioner

## 2022-04-25 ENCOUNTER — Encounter: Payer: Self-pay | Admitting: Nurse Practitioner

## 2022-04-30 ENCOUNTER — Other Ambulatory Visit: Payer: Self-pay | Admitting: Nurse Practitioner

## 2022-04-30 DIAGNOSIS — E1165 Type 2 diabetes mellitus with hyperglycemia: Secondary | ICD-10-CM

## 2022-05-01 ENCOUNTER — Telehealth: Payer: Self-pay | Admitting: Nurse Practitioner

## 2022-05-01 NOTE — Telephone Encounter (Signed)
Patient needs an office visit with me for DM follow up on 05/11/2022 or within a week after that date

## 2022-05-01 NOTE — Telephone Encounter (Signed)
Patient has been scheduled.

## 2022-05-04 NOTE — Progress Notes (Deleted)
Patient ID: Travis [name], male    DOB: 1956/03/05, 67 y.o.   MRN: [id number]  HPI  [name] is a 67 y/o male with a history of osteoarthritis, hyperlipidemia, gout, HTN, GI bleed, DM, CAD, anemia, asthma, previous alcohol use, current tobacco use and chronic heart failure.  Echo report from 01/07/21 reviewed and showed an EF of 45-50% along with mild MR. Echo report from 11/09/19 reviewed and showed an EF of 45-50% along with mild LVH. Echo report from 06/04/19 reviewed and showed an EF of 35-40% along with mild LVH. Reviewed last echo done 08/08/16 which showed an EF of 35-40%. This is an improvement from his last echo which was done 01/26/15 and showed an EF of 25-30% along with trivial MR/ TR. EF had dropped from 2015 when it was 45-50%.   Had a cardiac catheterization done 01/28/15 which showed 10% stenosis in Prox RCA to Mid RCA and 30% stenosis in Prox LAD to Mid LAD which is not much different from previous angiography. Cardiomyopathy felt to be due to previous alcohol use.   Has not been admitted or been in the ED in the last 6 months  He presents today with a chief complaint of a follow-up visit.   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 05/2019 Echo: EF 35-40%; b. 10/2019 Echo: EF 45-50%.   Allergy    Asthma    Central retinal artery occlusion of left eye 09/13/13   Chronic anemia    Clotting disorder ([place])    Diabetes mellitus, type 2 ([place])    pt reports his DM is gone   GI bleed    15 years ago   Gout    Hemoptysis    secondary to pulmonary edema   HFimpEF (heart failure with improved ejection fraction) ([place])    a. 12/2010 Echo: EF 20-25%; b 08/2013 Echo: EF 45-50%; c. 01/2015 Echo: EF 25-30%; d. 07/2016 Echo: EF 35-40%; e. 05/2019 Echo: EF 35-40%; d. 10/2019 Echo: EF 45-50%, mild LVH, mild red RV fxn, mildl dil RA, Triv MR. Mild to mod Ao Sclerosis w/o stenosis.   Hyperlipidemia    Hypertension    Nonischemic  cardiomyopathy (HCC)    Nonobstructive Coronary artery disease    a. 01/2015 Cath: LM nl, LAD 30p/m, LCX nl, RCA 10p/m, RPDA min irregs.   Osteoarthritis    Pneumonia    TIA (transient ischemic attack)    Tobacco abuse    Vision loss    peripherial vision only left eye.Central Retinal  artery occusion    Past Surgical History:  Procedure Laterality Date   BIOPSY  11/25/2019   Procedure: BIOPSY;  Surgeon: Ladene Artist, MD;  Location: [place];  Service: Endoscopy;;   CARDIAC CATHETERIZATION     CARDIAC CATHETERIZATION N/A 01/28/2015   Procedure: Left Heart Cath and Coronary Angiography;  Surgeon: Wellington Hampshire, MD;  Location: [place] [facility];  Service: Cardiovascular;  Laterality: N/A;   COLONOSCOPY     ESOPHAGOGASTRODUODENOSCOPY (EGD) WITH PROPOFOL N/A 11/25/2019   Procedure: ESOPHAGOGASTRODUODENOSCOPY (EGD) WITH PROPOFOL;  Surgeon: Ladene Artist, MD;  Location: [facility] ENDOSCOPY;  Service: Endoscopy;  Laterality: N/A;   HIP ARTHROPLASTY Right 03/15/2013   Procedure: ARTHROPLASTY BIPOLAR HIP;  Surgeon: [name], MD;  Location: [place];  Service: Orthopedics;  Laterality: Right;   LAPAROSCOPIC APPENDECTOMY N/A 01/01/2021   Procedure: DIAGNOSTIC LAPAROSCOPY; EXPLORATORY LAPAROTOMY, ILEUCECECTOMY, DRAINAGE OF ABDOMINAL  ABSCESS;  Surgeon: Georganna Skeans, MD;  Location: [place];  Service: General;  Laterality: N/A;   SPLENECTOMY, TOTAL N/A 01/06/2021   Procedure: SPLENECTOMY;  Surgeon: Jesusita Oka, MD;  Location: [place];  Service: General;  Laterality: N/A;   TOTAL SHOULDER ARTHROPLASTY Right 09/01/2015   Procedure: RIGHT TOTAL SHOULDER ARTHROPLASTY;  Surgeon: [name], MD;  Location: [place];  Service: Orthopedics;  Laterality: Right;   Family History  Problem Relation Age of Onset   Diabetes Mother    Hypertension Mother    Diabetes Father    Hypertension Father    Diabetes Sister    Dementia Brother    Cancer Neg Hx    Heart disease Neg Hx    Stroke Neg Hx     Colon cancer Neg Hx    Esophageal cancer Neg Hx    Rectal cancer Neg Hx    Stomach cancer Neg Hx    Social History   Tobacco Use   Smoking status: Former    Packs/day: 0.75    Years: 30.00    Total pack years: 22.50    Types: Cigarettes    Quit date: 12/2020    Years since quitting: 1.3   Smokeless tobacco: Never  Substance Use Topics   Alcohol use: Not Currently   Allergies  Allergen Reactions   Farxiga [Dapagliflozin] Other (See Comments)    Reduction in kidney function    Review of Systems  Constitutional:  Negative for appetite change and fatigue.  HENT:  Negative for congestion, postnasal drip and sore throat.   Eyes: Negative.   Respiratory:  Negative for cough, chest tightness, shortness of breath and wheezing.   Cardiovascular:  Negative for chest pain, palpitations and leg swelling.  Gastrointestinal:  Negative for abdominal distention and abdominal pain.  Endocrine: Negative.   Genitourinary: Negative.   Musculoskeletal:  Negative for arthralgias and back pain.  Skin: Negative.   Allergic/Immunologic: Negative.   Neurological:  Negative for dizziness and light-headedness.  Hematological:  Negative for adenopathy. Does not bruise/bleed easily.  Psychiatric/Behavioral:  Negative for dysphoric mood and sleep disturbance (sleeping on 2 pillows). The patient is not nervous/anxious.      Physical Exam Vitals and nursing note reviewed.  Constitutional:      Appearance: Normal appearance.  HENT:     Head: Normocephalic and atraumatic.  Cardiovascular:     Rate and Rhythm: Normal rate and regular rhythm.  Pulmonary:     Effort: Pulmonary effort is normal. No respiratory distress.     Breath sounds: No wheezing or rales.  Abdominal:     General: There is no distension.     Palpations: Abdomen is soft.     Tenderness: There is no abdominal tenderness.  Musculoskeletal:        General: No tenderness.     Cervical back: Normal range of motion and neck  supple.     Right lower leg: No edema.     Left lower leg: No edema.  Skin:    General: Skin is warm and dry.  Neurological:     General: No focal deficit present.     Mental Status: He is alert and oriented to person, place, and time.  Psychiatric:        Mood and Affect: Mood normal.        Behavior: Behavior normal.        Thought Content: Thought content normal.    Assessment & Plan:  1: Chronic heart failure with reduced  ejection fraction- - NYHA class I - euvolemic today - not weighing daily but does have working scales; emphasized to weigh daily so that he can call for an overnight weight gain of >2 pounds/weekly weight gain of >5 pounds.  - weight 199 pounds from last visit here 2 months ago - reports doing a lot of walking for exercise and strength building - cooking with salt and occasionally adds it after the fact  - Drinking approximately 64 oz of fluid daily - saw cardiology ([place]) 03/30/22 - currently on GDMT of carvedilol  - tried farxiga/ spironolactone in the past but renal function declined - tried entresto/ losartan in the past and BP dropped - palliative care visit done 11/06/21 - BNP 01/23/21 was 73.6 - reports receiving his flu, pneumonia and covid vaccines for this season  2: HTN- - BP  - BMP from 02/08/22 reviewed and shows sodium 136, potassium 4.5, creatinine 1.31 and GFR 56.89 - saw his PCP Charmian Muff) 02/08/22 - saw nephrology (Korrapati) 11/24/20  3: Tobacco use- - has not smoked anything since being released from his lengthy admission last fall - congratulated him and continued cessation discussed  4: Alcohol use- - has not drank any alcohol since admission last fall; says that that scared him - continued cessation discussed  5: DM- - checks blood glucose twice a week - A1c from 02/08/22 was 8.3% - metformin gives him GI upset if he eats the "wrong foods"   Medication bottles reviewed.

## 2022-05-07 ENCOUNTER — Encounter: Payer: Medicare Other | Admitting: Family

## 2022-05-07 ENCOUNTER — Telehealth: Payer: Self-pay | Admitting: Family

## 2022-05-07 NOTE — Telephone Encounter (Signed)
Patient did not show for his [facility] Failure [facility] appointment on 05/07/22. Will attempt to reschedule.

## 2022-05-08 ENCOUNTER — Other Ambulatory Visit: Payer: Self-pay | Admitting: Nurse Practitioner

## 2022-05-09 ENCOUNTER — Other Ambulatory Visit: Payer: Self-pay | Admitting: Nurse Practitioner

## 2022-05-09 DIAGNOSIS — E1165 Type 2 diabetes mellitus with hyperglycemia: Secondary | ICD-10-CM

## 2022-05-11 ENCOUNTER — Ambulatory Visit (INDEPENDENT_AMBULATORY_CARE_PROVIDER_SITE_OTHER): Payer: 59 | Admitting: Nurse Practitioner

## 2022-05-11 ENCOUNTER — Encounter: Payer: Self-pay | Admitting: Nurse Practitioner

## 2022-05-11 VITALS — BP 118/74 | HR 80 | Temp 98.0°F | Ht 76.0 in | Wt 202.6 lb

## 2022-05-11 DIAGNOSIS — M79641 Pain in right hand: Secondary | ICD-10-CM | POA: Diagnosis not present

## 2022-05-11 DIAGNOSIS — N183 Chronic kidney disease, stage 3 unspecified: Secondary | ICD-10-CM

## 2022-05-11 DIAGNOSIS — M79642 Pain in left hand: Secondary | ICD-10-CM

## 2022-05-11 DIAGNOSIS — M545 Low back pain, unspecified: Secondary | ICD-10-CM

## 2022-05-11 DIAGNOSIS — E119 Type 2 diabetes mellitus without complications: Secondary | ICD-10-CM | POA: Diagnosis not present

## 2022-05-11 DIAGNOSIS — G8929 Other chronic pain: Secondary | ICD-10-CM

## 2022-05-11 LAB — BASIC METABOLIC PANEL
BUN: 23 mg/dL (ref 6–23)
CO2: 27 mEq/L (ref 19–32)
Calcium: 10 mg/dL (ref 8.4–10.5)
Chloride: 104 mEq/L (ref 96–112)
Creatinine, Ser: 1.7 mg/dL — ABNORMAL HIGH (ref 0.40–1.50)
GFR: 41.54 mL/min — ABNORMAL LOW (ref >60.00)
Glucose, Bld: 92 mg/dL (ref 70–99)
Potassium: 4.3 mEq/L (ref 3.5–5.1)
Sodium: 139 mEq/L (ref 135–145)

## 2022-05-11 LAB — POCT GLYCOSYLATED HEMOGLOBIN (HGB A1C): Hemoglobin A1C: 7.1 % — AB (ref 4.0–5.6)

## 2022-05-11 MED ORDER — TRAMADOL HCL 50 MG PO TABS: 50.0000 mg | ORAL_TABLET | Freq: Three times a day (TID) | ORAL | 0 refills | Status: AC | PRN

## 2022-05-11 NOTE — Assessment & Plan Note (Signed)
Patient's GFR hovering around the mid to high 50s we will continue to monitor

## 2022-05-11 NOTE — Patient Instructions (Signed)
Nice to see you today Your A1C has come down to 7.1 I want it below 7. We will not make any medication changes at this time Follow up with me in 3 months sooner if you need me I will be in touch with the labs once I have them

## 2022-05-11 NOTE — Progress Notes (Signed)
Established Patient Office Visit  Subjective   Patient ID: Travis [name], male    DOB: 02-20-1956  Age: 67 y.o. MRN: [id number]  Chief Complaint  Patient presents with   Diabetes       DM2: traditionally he is checking his sugar once every two weeks. He is currently on glimepiride, metformin. He has tried an SGLT2 but caused a dip in his kidney function and was taken off.    HTN: checking it once a week traditionally. Doing well on the carvedilol. He was recently seen by cardiology earlier this year.  Patient denies any lightheadedness or dizziness or recent falls.   Arthritis: Patient complaining about his lower back pain and bilateral hand pain.  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  Patient does do Tylenol that sometimes helps but does not give great relief.  He has used tramadol as needed in the past along with Tylenol that seems to give good relief.  Patient is requesting refill today       05/11/2022    9:48 AM 10/25/2021   10:07 AM 07/31/2021   10:12 AM  PHQ9 SCORE ONLY  PHQ-9 Total Score 8 1 0        05/11/2022    9:49 AM 10/25/2021   10:08 AM 07/25/2021    9:36 AM 05/25/2021   11:55 AM  GAD 7 : Generalized Anxiety Score  Nervous, Anxious, on Edge 1 0 0 0  Control/stop worrying 1 0 0 0  Worry too much - different things 1 0 0 0  Trouble relaxing 1 0 0 0  Restless 0 0 0 0  Easily annoyed or irritable 1 0 0 0  Afraid - awful might happen 0 0 0 0  Total GAD 7 Score 5 0 0 0  Anxiety Difficulty Not difficult at all Not difficult at all Not difficult at all Not difficult at all       Review of Systems  Constitutional:  Negative for chills and fever.  Respiratory:  Negative for shortness of breath.   Cardiovascular:  Negative for chest pain.  Gastrointestinal:  Negative for abdominal pain, nausea and vomiting.  Genitourinary:  Negative for dysuria and hematuria.      Objective:     BP 118/74   Pulse 80   Temp 98 F (36.7 C) (Oral)   Ht 6' 4"$   (1.93 m)   Wt 202 lb 9.6 oz (91.9 kg)   SpO2 99%   BMI 24.66 kg/m  BP Readings from Last 3 Encounters:  05/11/22 118/74  03/30/22 (!) 148/92  03/06/22 (!) 140/88   Wt Readings from Last 3 Encounters:  05/11/22 202 lb 9.6 oz (91.9 kg)  03/30/22 201 lb (91.2 kg)  03/06/22 199 lb (90.3 kg)      Physical Exam Vitals and nursing note reviewed.  Constitutional:      Appearance: Normal appearance.  Cardiovascular:     Rate and Rhythm: Normal rate and regular rhythm.     Pulses:          Dorsalis pedis pulses are 1+ on the right side and 1+ on the left side.       Posterior tibial pulses are 1+ on the right side and 1+ on the left side.     Heart sounds: Normal heart sounds.  Pulmonary:     Effort: Pulmonary effort is normal.     Breath sounds: Normal breath sounds.  Musculoskeletal:     Right lower  leg: No edema.     Left lower leg: No edema.       Feet:  Feet:     Right foot:     Skin integrity: Skin integrity normal.     Left foot:     Skin integrity: Skin integrity normal.  Skin:    General: Skin is warm.  Neurological:     Mental Status: He is alert.      Results for orders placed or performed in visit on 05/11/22  POCT glycosylated hemoglobin (Hb A1C)  Result Value Ref Range   Hemoglobin A1C 7.1 (A) 4.0 - 5.6 %   HbA1c POC (<> result, manual entry)     HbA1c, POC (prediabetic range)     HbA1c, POC (controlled diabetic range)        The 10-year ASCVD risk score (Arnett DK, et al., 2019) is: 40.8%    Assessment & Plan:   Problem List Items Addressed This Visit       Endocrine   Diabetes ([place]) - Primary    Patient currently maintained on metformin and glimepiride.  Patient is tolerating medication well.  He is not checking his glucose very often.  No reports of hypoglycemia per him.  A1c did trend down to 7.1% will not change any medications at this juncture patient will watch dietary intake and recheck in 3 months      Relevant Orders   POCT  glycosylated hemoglobin (Hb A1C) (Completed)   Basic metabolic panel     Genitourinary   CKD (chronic kidney disease), stage III (HCC)    Patient's GFR hovering around the mid to high 50s we will continue to monitor      Relevant Orders   Basic metabolic panel     Other   Chronic bilateral low back pain without sciatica    Persistent problem.  Resistant to acetaminophen only.  Short course of tramadol sent in.      Relevant Medications   traMADol (ULTRAM) 50 MG tablet   Bilateral hand pain    Persistent ongoing trouble resistant to acetaminophen only.  Short course of tramadol sent      Relevant Medications   traMADol (ULTRAM) 50 MG tablet    Return in about 3 months (around 08/09/2022) for DM recheck.    Romilda Garret, NP

## 2022-05-11 NOTE — Assessment & Plan Note (Signed)
Patient currently maintained on metformin and glimepiride.  Patient is tolerating medication well.  He is not checking his glucose very often.  No reports of hypoglycemia per him.  A1c did trend down to 7.1% will not change any medications at this juncture patient will watch dietary intake and recheck in 3 months

## 2022-05-11 NOTE — Assessment & Plan Note (Signed)
Persistent ongoing trouble resistant to acetaminophen only.  Short course of tramadol sent

## 2022-05-11 NOTE — Assessment & Plan Note (Signed)
Persistent problem.  Resistant to acetaminophen only.  Short course of tramadol sent in.

## 2022-05-14 ENCOUNTER — Other Ambulatory Visit: Payer: Self-pay | Admitting: Nurse Practitioner

## 2022-05-14 DIAGNOSIS — N289 Disorder of kidney and ureter, unspecified: Secondary | ICD-10-CM

## 2022-05-22 ENCOUNTER — Other Ambulatory Visit (INDEPENDENT_AMBULATORY_CARE_PROVIDER_SITE_OTHER): Payer: 59

## 2022-05-22 DIAGNOSIS — N289 Disorder of kidney and ureter, unspecified: Secondary | ICD-10-CM

## 2022-05-22 LAB — BASIC METABOLIC PANEL
BUN: 19 mg/dL (ref 6–23)
CO2: 26 mEq/L (ref 19–32)
Calcium: 9.9 mg/dL (ref 8.4–10.5)
Chloride: 104 mEq/L (ref 96–112)
Creatinine, Ser: 1.42 mg/dL (ref 0.40–1.50)
GFR: 51.55 mL/min — ABNORMAL LOW (ref >60.00)
Glucose, Bld: 96 mg/dL (ref 70–99)
Potassium: 4.3 mEq/L (ref 3.5–5.1)
Sodium: 139 mEq/L (ref 135–145)

## 2022-05-24 ENCOUNTER — Ambulatory Visit: Payer: 59 | Attending: Family | Admitting: Family

## 2022-05-24 ENCOUNTER — Encounter: Payer: Self-pay | Admitting: Family

## 2022-05-24 VITALS — BP 138/92 | HR 89 | Ht 76.0 in | Wt 202.0 lb

## 2022-05-24 DIAGNOSIS — M1990 Unspecified osteoarthritis, unspecified site: Secondary | ICD-10-CM | POA: Diagnosis not present

## 2022-05-24 DIAGNOSIS — I110 Hypertensive heart disease with heart failure: Secondary | ICD-10-CM | POA: Insufficient documentation

## 2022-05-24 DIAGNOSIS — N1831 Chronic kidney disease, stage 3a: Secondary | ICD-10-CM | POA: Diagnosis not present

## 2022-05-24 DIAGNOSIS — I5042 Chronic combined systolic (congestive) and diastolic (congestive) heart failure: Secondary | ICD-10-CM | POA: Diagnosis not present

## 2022-05-24 DIAGNOSIS — E1122 Type 2 diabetes mellitus with diabetic chronic kidney disease: Secondary | ICD-10-CM | POA: Diagnosis not present

## 2022-05-24 DIAGNOSIS — E785 Hyperlipidemia, unspecified: Secondary | ICD-10-CM | POA: Diagnosis not present

## 2022-05-24 DIAGNOSIS — J45909 Unspecified asthma, uncomplicated: Secondary | ICD-10-CM | POA: Diagnosis not present

## 2022-05-24 DIAGNOSIS — I5022 Chronic systolic (congestive) heart failure: Secondary | ICD-10-CM

## 2022-05-24 DIAGNOSIS — K30 Functional dyspepsia: Secondary | ICD-10-CM | POA: Diagnosis not present

## 2022-05-24 DIAGNOSIS — M109 Gout, unspecified: Secondary | ICD-10-CM | POA: Diagnosis not present

## 2022-05-24 DIAGNOSIS — E119 Type 2 diabetes mellitus without complications: Secondary | ICD-10-CM | POA: Diagnosis not present

## 2022-05-24 DIAGNOSIS — I10 Essential (primary) hypertension: Secondary | ICD-10-CM | POA: Diagnosis not present

## 2022-05-24 DIAGNOSIS — Z720 Tobacco use: Secondary | ICD-10-CM | POA: Diagnosis not present

## 2022-05-24 DIAGNOSIS — I2510 Atherosclerotic heart disease of native coronary artery without angina pectoris: Secondary | ICD-10-CM | POA: Diagnosis not present

## 2022-05-24 NOTE — Progress Notes (Addendum)
Patient ID: Travis [name], male    DOB: 23-Jul-1955, 67 y.o.   MRN: [id number]  HPI  Travis [name] is a 67 y/o male with a history of osteoarthritis, hyperlipidemia, gout, HTN, GI bleed, DM, CAD, anemia, asthma, previous alcohol use, current tobacco use and chronic heart failure.  Echo report from 01/07/21 reviewed and showed an EF of 45-50% along with mild Travis. Echo report from 11/09/19 reviewed and showed an EF of 45-50% along with mild LVH. Echo report from 06/04/19 reviewed and showed an EF of 35-40% along with mild LVH. Reviewed last echo done 08/08/16 which showed an EF of 35-40%. This is an improvement from his last echo which was done 01/26/15 and showed an EF of 25-30% along with trivial Travis/ TR. EF had dropped from 2015 when it was 45-50%.   Had a cardiac catheterization done 01/28/15 which showed 10% stenosis in Prox RCA to Mid RCA and 30% stenosis in Prox LAD to Mid LAD which is not much different from previous angiography. Cardiomyopathy felt to be due to previous alcohol use.   Has not been admitted or been in the ED in the last 6 months  He presents today with a chief complaint of a follow-up visit. Currently has no symptoms and specifically denies any difficulty sleeping, dizziness, abdominal distention, palpitations, pedal edema, chest pain, wheezing, SOB, cough, fatigue or weight gain.   Says that he just took his medications before leaving home and was rushing to get here because he thought he was going to be late.   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 05/2019 Echo: EF 35-40%; b. 10/2019 Echo: EF 45-50%.   Allergy    Asthma    Central retinal artery occlusion of left eye 09/13/13   Chronic anemia    Clotting disorder ([place])    Diabetes mellitus, type 2 ([place])    pt reports his DM is gone   GI bleed    15 years ago   Gout    Hemoptysis    secondary to pulmonary edema   HFimpEF (heart failure with improved ejection  fraction) ([place])    a. 12/2010 Echo: EF 20-25%; b 08/2013 Echo: EF 45-50%; c. 01/2015 Echo: EF 25-30%; d. 07/2016 Echo: EF 35-40%; e. 05/2019 Echo: EF 35-40%; d. 10/2019 Echo: EF 45-50%, mild LVH, mild red RV fxn, mildl dil RA, Triv Travis. Mild to mod Ao Sclerosis w/o stenosis.   Hyperlipidemia    Hypertension    Nonischemic cardiomyopathy (HCC)    Nonobstructive Coronary artery disease    a. 01/2015 Cath: LM nl, LAD 30p/m, LCX nl, RCA 10p/m, RPDA min irregs.   Osteoarthritis    Pneumonia    TIA (transient ischemic attack)    Tobacco abuse    Vision loss    peripherial vision only left eye.Central Retinal  artery occusion    Past Surgical History:  Procedure Laterality Date   BIOPSY  11/25/2019   Procedure: BIOPSY;  Surgeon: Ladene Artist, MD;  Location: [place];  Service: Endoscopy;;   CARDIAC CATHETERIZATION     CARDIAC CATHETERIZATION N/A 01/28/2015   Procedure: Left Heart Cath and Coronary Angiography;  Surgeon: Wellington Hampshire, MD;  Location: [facility];  Service: Cardiovascular;  Laterality: N/A;   COLONOSCOPY     ESOPHAGOGASTRODUODENOSCOPY (EGD) WITH PROPOFOL N/A 11/25/2019   Procedure: ESOPHAGOGASTRODUODENOSCOPY (EGD) WITH PROPOFOL;  Surgeon: Ladene Artist, MD;  [facility]  ENDOSCOPY;  Service: Endoscopy;  Laterality: N/A;   HIP ARTHROPLASTY Right 03/15/2013   Procedure: ARTHROPLASTY BIPOLAR HIP;  Surgeon: [name], MD;  Location: [facility] [place];  Service: Orthopedics;  Laterality: Right;   LAPAROSCOPIC APPENDECTOMY N/A 01/01/2021   Procedure: DIAGNOSTIC LAPAROSCOPY; EXPLORATORY LAPAROTOMY, ILEUCECECTOMY, DRAINAGE OF ABDOMINAL ABSCESS;  Surgeon: Georganna Skeans, MD;  Location: [place];  Service: General;  Laterality: N/A;   SPLENECTOMY, TOTAL N/A 01/06/2021   Procedure: SPLENECTOMY;  Surgeon: Jesusita Oka, MD;  Location: [facility] [place];  Service: General;  Laterality: N/A;   TOTAL SHOULDER ARTHROPLASTY Right 09/01/2015   Procedure: RIGHT TOTAL SHOULDER ARTHROPLASTY;   Surgeon: [name], MD;  Location: [place];  Service: Orthopedics;  Laterality: Right;   Family History  Problem Relation Age of Onset   Diabetes Mother    Hypertension Mother    Diabetes Father    Hypertension Father    Diabetes Sister    Dementia Brother    Cancer Neg Hx    Heart disease Neg Hx    Stroke Neg Hx    Colon cancer Neg Hx    Esophageal cancer Neg Hx    Rectal cancer Neg Hx    Stomach cancer Neg Hx    Social History   Tobacco Use   Smoking status: Former    Packs/day: 0.75    Years: 30.00    Total pack years: 22.50    Types: Cigarettes    Quit date: 12/2020    Years since quitting: 1.4   Smokeless tobacco: Never  Substance Use Topics   Alcohol use: Not Currently   Allergies  Allergen Reactions   Farxiga [Dapagliflozin] Other (See Comments)    Reduction in kidney function   Prior to Admission medications   Medication Sig Start Date End Date Taking? Authorizing Provider  acetaminophen (TYLENOL) 500 MG tablet Take 2 tablets (1,000 mg total) by mouth every 8 (eight) hours as needed. 02/06/21  Yes Ghimire, Henreitta Leber, MD  albuterol (VENTOLIN HFA) 108 (90 Base) MCG/ACT inhaler Inhale 2 puffs into the lungs every 6 (six) hours as needed for wheezing or shortness of breath.   Yes [provider]  allopurinol (ZYLOPRIM) 100 MG tablet TAKE 1 TABLET BY MOUTH TWICE A DAY 04/03/22  Yes [name], NP  atorvastatin (LIPITOR) 20 MG tablet TAKE 1 TABLET BY MOUTH DAILY 09/04/21  Yes Dutch Quint B, FNP  Blood Pressure KIT Check b/p once daily. Based on insurance preference 03/03/21  Yes [name], NP  carvedilol (COREG) 6.25 MG tablet TAKE 1 TABLET BY MOUTH TWICE A DAY 11/08/21  Yes Travis [name], Areta Haber, PA-C  cetirizine (ZYRTEC) 10 MG tablet Take 1 tablet (10 mg total) by mouth daily. 02/08/22  Yes [name], NP  fenofibrate (TRICOR) 145 MG tablet TAKE 1 TABLET BY MOUTH ONCE A DAY 05/09/22  Yes [name], NP  ferrous sulfate 325 (65 FE) MG tablet Take 1  tablet (325 mg total) by mouth daily with breakfast. 06/02/20  Yes Baity, Coralie Keens, NP  fluticasone (FLONASE) 50 MCG/ACT nasal spray Place 1-2 sprays into both nostrils daily as needed for allergies or rhinitis.   Yes [provider]  glimepiride (AMARYL) 1 MG tablet TAKE 1 TABLET BY MOUTH EVERY MORNING WITH BREAKFAST 05/01/22  Yes [name], NP  hydroxypropyl methylcellulose / hypromellose (ISOPTO TEARS / GONIOVISC) 2.5 % ophthalmic solution Place 1 drop into both eyes 3 (three) times daily as needed for dry eyes.   Yes  [provider]  magnesium oxide (MAG-OX) 400 MG tablet Take 1 tablet (400 mg total) by mouth daily. 01/10/22  Yes [name], NP  metFORMIN (GLUCOPHAGE) 500 MG tablet TAKE 2 TABLETS BY MOUTH TWICE A DAY WITHA MEAL 02/14/22  Yes [name], NP  multivitamin (ONE-A-DAY MEN'S) TABS tablet Take 1 tablet by mouth daily with breakfast.   Yes [provider]  Omega-3 Fatty Acids (FISH OIL PO) Take 1 capsule by mouth daily. 2000 mg   Yes [provider]  pantoprazole (PROTONIX) 40 MG tablet TAKE 1 TABLET BY MOUTH ONCE A DAY 01/15/22  Yes [name], NP    Review of Systems  Constitutional:  Negative for appetite change and fatigue.  HENT:  Negative for congestion, postnasal drip and sore throat.   Eyes: Negative.   Respiratory:  Negative for cough, chest tightness, shortness of breath and wheezing.   Cardiovascular:  Negative for chest pain, palpitations and leg swelling.  Gastrointestinal:  Negative for abdominal distention and abdominal pain.  Endocrine: Negative.   Genitourinary: Negative.   Musculoskeletal:  Negative for arthralgias and back pain.  Skin: Negative.   Allergic/Immunologic: Negative.   Neurological:  Negative for dizziness and light-headedness.  Hematological:  Negative for adenopathy. Does not bruise/bleed easily.  Psychiatric/Behavioral:  Negative for dysphoric mood and sleep disturbance (sleeping on 2 pillows).  The patient is not nervous/anxious.    Vitals:   05/24/22 0858  BP: (!) 138/92  Pulse: 89  SpO2: 98%  Weight: 202 lb (91.6 kg)  Height: 6\' 4"  (1.93 m)   Wt Readings from Last 3 Encounters:  05/24/22 202 lb (91.6 kg)  05/11/22 202 lb 9.6 oz (91.9 kg)  03/30/22 201 lb (91.2 kg)   Lab Results  Component Value Date   CREATININE 1.42 05/22/2022   CREATININE 1.70 (H) 05/11/2022   CREATININE 1.31 02/08/2022   Physical Exam Vitals and nursing note reviewed.  Constitutional:      Appearance: Normal appearance.  HENT:     Head: Normocephalic and atraumatic.  Cardiovascular:     Rate and Rhythm: Normal rate and regular rhythm.  Pulmonary:     Effort: Pulmonary effort is normal. No respiratory distress.     Breath sounds: No wheezing or rales.  Abdominal:     General: There is no distension.     Palpations: Abdomen is soft.     Tenderness: There is no abdominal tenderness.  Musculoskeletal:        General: No tenderness.     Cervical back: Normal range of motion and neck supple.     Right lower leg: No edema.     Left lower leg: No edema.  Skin:    General: Skin is warm and dry.  Neurological:     General: No focal deficit present.     Mental Status: He is alert and oriented to person, place, and time.  Psychiatric:        Mood and Affect: Mood normal.        Behavior: Behavior normal.        Thought Content: Thought content normal.    Assessment & Plan:  1: Alcoholic cardiomyopathy- - NYHA class I - euvolemic today - weighing daily; reminded to call for an overnight weight gain of >2 pounds/weekly weight gain of >5 pounds.  - weight up 3 pounds from last visit here 2 months ago - reports doing a lot of walking for exercise and strength building - sometimes cooks with  salt - Drinking approximately 64 oz of fluid daily - saw cardiology (Travis [name]) 03/30/22; returns 07/2022 - echo has been scheduled on 06/19/22 - carvedilol 6.25mg  BID - tried farxiga/ spironolactone in the  past but renal function declined - tried entresto/ losartan in the past and BP dropped - palliative care visit done 11/06/21 - BNP 01/23/21 was 73.6  2: HTN- - BP 138/92; just took meds before appt today - BMP from 05/22/22 reviewed and shows sodium 139, potassium 4.3, creatinine 1.42 and GFR 51.55 - saw his PCP Travis [name]) 05/11/22 & BP was 118/74 - saw nephrology Travis [name]) 11/24/20  3: Tobacco use- - has not smoked anything since being released from his lengthy admission last fall - congratulated him and continued cessation discussed  4: DM- - checks blood glucose twice a week - A1c from 05/11/22 was 7.1% - metformin gives him GI upset if he eats the "wrong foods"   Medication bottles reviewed.   Return in 6 months, sooner if needed.

## 2022-06-01 ENCOUNTER — Other Ambulatory Visit: Payer: 59

## 2022-06-12 ENCOUNTER — Other Ambulatory Visit: Payer: Self-pay | Admitting: Nurse Practitioner

## 2022-06-12 DIAGNOSIS — E1165 Type 2 diabetes mellitus with hyperglycemia: Secondary | ICD-10-CM

## 2022-06-15 ENCOUNTER — Other Ambulatory Visit: Payer: Self-pay | Admitting: Nurse Practitioner

## 2022-06-19 ENCOUNTER — Ambulatory Visit
Admission: RE | Admit: 2022-06-19 | Discharge: 2022-06-19 | Disposition: A | Discharge status: Home / Self Care | Payer: 59 | Source: Ambulatory Visit | Attending: Family | Admitting: Family

## 2022-06-19 DIAGNOSIS — I110 Hypertensive heart disease with heart failure: Secondary | ICD-10-CM | POA: Insufficient documentation

## 2022-06-19 DIAGNOSIS — I429 Cardiomyopathy, unspecified: Secondary | ICD-10-CM | POA: Insufficient documentation

## 2022-06-19 DIAGNOSIS — I5022 Chronic systolic (congestive) heart failure: Secondary | ICD-10-CM

## 2022-06-19 DIAGNOSIS — Z720 Tobacco use: Secondary | ICD-10-CM | POA: Diagnosis not present

## 2022-06-19 LAB — ECHOCARDIOGRAM COMPLETE
AR max vel: 2.37 cm2
AV Area VTI: 2.79 cm2
AV Area mean vel: 2.29 cm2
AV Mean grad: 2 mmHg
AV Peak grad: 3.7 mmHg
Ao pk vel: 0.97 m/s
Area-P 1/2: 6.43 cm2
Calc EF: 20.2 %
S' Lateral: 5.3 cm
Single Plane A2C EF: 23.7 %
Single Plane A4C EF: 13.9 %

## 2022-06-19 NOTE — Progress Notes (Signed)
*  PRELIMINARY RESULTS* Echocardiogram 2D Echocardiogram has been performed.  Sherrie Sport 06/19/2022, 11:43 AM

## 2022-06-19 NOTE — Progress Notes (Signed)
Pt aware, agreeable, and verbalized understanding. Next appt with an [facility] MD set for April.

## 2022-06-29 ENCOUNTER — Telehealth: Payer: Self-pay | Admitting: Nurse Practitioner

## 2022-06-29 NOTE — Telephone Encounter (Signed)
Type of forms received:Parking Placard  Routed NW:GNFAO Pool  Paperwork received by : Lesly Rubenstein   Individual made aware of 3-5 business day turn around (Y/N): Y  Form completed and patient made aware of charges(Y/N): Y   Faxed to :   Form location: Place in PCP [employer]

## 2022-06-29 NOTE — Telephone Encounter (Signed)
Placed in Provider box

## 2022-07-03 ENCOUNTER — Other Ambulatory Visit: Payer: Self-pay | Admitting: Nurse Practitioner

## 2022-07-06 ENCOUNTER — Telehealth: Payer: Self-pay | Admitting: Nurse Practitioner

## 2022-07-06 NOTE — Telephone Encounter (Signed)
Patient needs to be schedule per my last note please

## 2022-07-09 ENCOUNTER — Ambulatory Visit (HOSPITAL_BASED_OUTPATIENT_CLINIC_OR_DEPARTMENT_OTHER): Payer: 59 | Admitting: Cardiology

## 2022-07-09 ENCOUNTER — Encounter: Payer: Self-pay | Admitting: Cardiology

## 2022-07-09 ENCOUNTER — Other Ambulatory Visit
Admission: RE | Admit: 2022-07-09 | Discharge: 2022-07-09 | Disposition: A | Discharge status: Home / Self Care | Payer: 59 | Source: Ambulatory Visit | Attending: Cardiology | Admitting: Cardiology

## 2022-07-09 VITALS — BP 151/87 | HR 82 | Resp 14 | Wt 201.0 lb

## 2022-07-09 DIAGNOSIS — I5022 Chronic systolic (congestive) heart failure: Secondary | ICD-10-CM | POA: Diagnosis not present

## 2022-07-09 LAB — CBC
HCT: 42.4 % (ref 39.0–52.0)
Hemoglobin: 13.9 g/dL (ref 13.0–17.0)
MCH: 31.5 pg (ref 26.0–34.0)
MCHC: 32.8 g/dL (ref 30.0–36.0)
MCV: 96.1 fL (ref 80.0–100.0)
Platelets: 343 10*3/uL (ref 150–400)
RBC: 4.41 MIL/uL (ref 4.22–5.81)
RDW: 15.4 % (ref 11.5–15.5)
WBC: 8.3 10*3/uL (ref 4.0–10.5)
nRBC: 0.4 % — ABNORMAL HIGH (ref 0.0–0.2)

## 2022-07-09 LAB — BRAIN NATRIURETIC PEPTIDE: B Natriuretic Peptide: 54.7 pg/mL (ref 0.0–100.0)

## 2022-07-09 LAB — BASIC METABOLIC PANEL
Anion gap: 9 (ref 5–15)
BUN: 19 mg/dL (ref 8–23)
CO2: 25 mmol/L (ref 22–32)
Calcium: 9.5 mg/dL (ref 8.9–10.3)
Chloride: 107 mmol/L (ref 98–111)
Creatinine, Ser: 1.36 mg/dL — ABNORMAL HIGH (ref 0.61–1.24)
GFR, Estimated: 57 mL/min — ABNORMAL LOW (ref >60)
Glucose, Bld: 124 mg/dL — ABNORMAL HIGH (ref 70–99)
Potassium: 4.3 mmol/L (ref 3.5–5.1)
Sodium: 141 mmol/L (ref 135–145)

## 2022-07-09 MED ORDER — SPIRONOLACTONE 25 MG PO TABS: 12.5000 mg | ORAL_TABLET | Freq: Every day | ORAL | 3 refills | Status: DC

## 2022-07-09 NOTE — Progress Notes (Signed)
PCP: Eden Emms, NP HF Cardiology: Dr. Shirlee Latch  67 y.o. with history of CHF, HTN, CKD stage 3 who was referred to HF MD by Clarisa Kindred.  Patient has a long history of cardiomyopathy.  In 11/16, echo was done showing EF 25-30%.  Cath at that time showed nonobstructive CAD.  Over the years, his LV function improved, with echoes in 2021 and 2022 showing EF 45-50%. He was a prior heavy drinker but quit several years ago. He is also a prior smoker. ETOH was thought to contribute to his cardiomyopathy. He had a long admission in 10/22 with ruptured appendicitis and sepsis.  This admission was also complicated by splenic rupture and splenectomy.  GDMT in recent years appears to have been limited by low BP and renal dysfunction.    In 1/24, he had an echo done that showed EF 25-30% with mild LV dilation and mild LVH. This was a signficant fall from previous years.  He did not report any significant changes around this time.   Patient currently does not smoke cigarettes or drink, he does use marijuana occasionally.  No other drugs.  Patient walks 1 mile daily, he can be a little short of breath when the walk starts but this improves with time.  No chest pain. No orthopnea/PND.  He is more limited by right knee pain than anything else.  He can carry groceries into the house.  Of note, parents both had MIs in their 13s.   Labs (11/13): LDL 80 Labs (2/24): K 4.3, creatinine 1.42  ECG: NSR, LAFB, nonspecific anterolateral TWIs  PMH: 1. Hyperlipidemia 2. Gout 3. HTN 4. GI bleeding 5. Type 2 diabetes 6. H/o ETOH abuse: Quit drinking in 2022.  7. Central retinal artery occlusion: Left eye, 2015.  8. CKD stage 3 9. Appendicitis: 10/22 hospitalization with ruptured appendix, also complicated by splenic rupture needing splenectomy.  10. H/o LUE DVT: Thought to be related to PICC.  11. H/o TIA 12. Chronic systolic CHF: Echo in 11/16 with EF 25-30%, cath in 2016 with nonobstructive CAD.  - Echo (5/18): EF  35-40%.  - Echo (3/21): EF 35-40% - Echo (8/21): EF 45-50% - Echo (10/22): EF 45-50%, mild MR - Echo (3/24): EF 25-30%, mild LV dilation, mild LVH, normal RV, mild MR.  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  Social History   Socioeconomic History   Marital status: Single    Spouse name: Not on file   Number of children: 0   Years of education: 12   Highest education level: High school graduate  Occupational History   Occupation: Retired  Tobacco Use   Smoking status: Former    Packs/day: 0.75    Years: 30.00    Additional pack years: 0.00    Total pack years: 22.50    Types: Cigarettes    Quit date: 12/2020    Years since quitting: 1.5   Smokeless tobacco: Never  Vaping Use   Vaping Use: Never used  Substance and Sexual Activity   Alcohol use: Not Currently   Drug use: Not Currently   Sexual activity: Yes    Birth control/protection: Condom  Other Topics Concern   Not on file  Social History Narrative   Not on file   Social Determinants of Health   Financial Resource Strain: Low Risk  (07/31/2021)   Overall Financial Resource Strain (CARDIA)    Difficulty of Paying Living Expenses: Not hard at all  Food Insecurity: No Food Insecurity (07/31/2021)  Hunger Vital Sign    Worried About Running Out of Food in the Last Year: Never true    Ran Out of Food in the Last Year: Never true  Transportation Needs: No Transportation Needs (07/31/2021)   PRAPARE - Administrator, Civil Service (Medical): No    Lack of Transportation (Non-Medical): No  Physical Activity: Sufficiently Active (07/31/2021)   Exercise Vital Sign    Days of Exercise per Week: 6 days    Minutes of Exercise per Session: 30 min  Stress: No Stress Concern Present (07/31/2021)   [employer] of Occupational Health - Occupational Stress Questionnaire    Feeling of Stress : Not at all  Social Connections: Moderately Integrated (07/31/2021)   Social Connection and Isolation Panel [NHANES]     Frequency of Communication with Friends and Family: More than three times a week    Frequency of Social Gatherings with Friends and Family: More than three times a week    Attends Religious Services: More than 4 times per year    Active Member of [employer] or Organizations: Yes    Attends Engineer, structural: More than 4 times per year    Marital Status: Never married  Intimate Partner Violence: Not At Risk (07/31/2021)   Humiliation, Afraid, Rape, and Kick questionnaire    Fear of Current or Ex-Partner: No    Emotionally Abused: No    Physically Abused: No    Sexually Abused: No   Family History  Problem Relation Age of Onset   Diabetes Mother    Hypertension Mother    Diabetes Father    Hypertension Father    Diabetes Sister    Dementia Brother    Cancer Neg Hx    Heart disease Neg Hx    Stroke Neg Hx    Colon cancer Neg Hx    Esophageal cancer Neg Hx    Rectal cancer Neg Hx    Stomach cancer Neg Hx    ROS: All systems reviewed and negative except as per HPI.   Current Outpatient Medications on File Prior to Visit  Medication Sig Dispense Refill   acetaminophen (TYLENOL) 500 MG tablet Take 2 tablets (1,000 mg total) by mouth every 8 (eight) hours as needed. 30 tablet 0   albuterol (VENTOLIN HFA) 108 (90 Base) MCG/ACT inhaler Inhale 2 puffs into the lungs every 6 (six) hours as needed for wheezing or shortness of breath.     allopurinol (ZYLOPRIM) 100 MG tablet TAKE ONE TABLET BY MOUTH TWICE A DAY 180 tablet 1   atorvastatin (LIPITOR) 20 MG tablet TAKE ONE TABLET BY MOUTH DAILY 100 tablet 1   Blood Pressure KIT Check b/p once daily. Based on insurance preference 1 kit 0   carvedilol (COREG) 6.25 MG tablet TAKE 1 TABLET BY MOUTH TWICE A DAY 180 tablet 2   cetirizine (ZYRTEC) 10 MG tablet Take 1 tablet (10 mg total) by mouth daily. 90 tablet 3   fenofibrate (TRICOR) 145 MG tablet TAKE 1 TABLET BY MOUTH ONCE A DAY 90 tablet 1   ferrous sulfate 325 (65 FE) MG tablet Take 1  tablet (325 mg total) by mouth daily with breakfast. 90 tablet 2   fluticasone (FLONASE) 50 MCG/ACT nasal spray Place 1-2 sprays into both nostrils daily as needed for allergies or rhinitis.     glimepiride (AMARYL) 1 MG tablet TAKE 1 TABLET BY MOUTH EVERY MORNING WITH BREAKFAST 90 tablet 0   hydroxypropyl methylcellulose / hypromellose (ISOPTO TEARS /  GONIOVISC) 2.5 % ophthalmic solution Place 1 drop into both eyes 3 (three) times daily as needed for dry eyes.     magnesium oxide (MAG-OX) 400 MG tablet Take 1 tablet (400 mg total) by mouth daily. 90 tablet 1   metFORMIN (GLUCOPHAGE) 500 MG tablet TAKE TWO TABLETS BY MOUTH TWICE A DAY WITH A MEAL 360 tablet 1   multivitamin (ONE-A-DAY MEN'S) TABS tablet Take 1 tablet by mouth daily with breakfast.     Omega-3 Fatty Acids (FISH OIL PO) Take 1 capsule by mouth daily. 2000 mg     pantoprazole (PROTONIX) 40 MG tablet TAKE ONE TABLET BY MOUTH ONCE A DAY 90 tablet 1   No current facility-administered medications on file prior to visit.   General: NAD Neck: No JVD, no thyromegaly or thyroid nodule.  Lungs: Clear to auscultation bilaterally with normal respiratory effort. CV: Nondisplaced PMI.  Heart regular S1/S2, no S3/S4, no murmur.  No peripheral edema.  No carotid bruit.  Normal pedal pulses.  Abdomen: Soft, nontender, no hepatosplenomegaly, no distention.  Skin: Intact without lesions or rashes.  Neurologic: Alert and oriented x 3.  Psych: Normal affect. Extremities: No clubbing or cyanosis.  HEENT: Normal.   Assessment/Plan: 1. Chronic systolic CHF: Long-standing, thought to be nonischemic cardiomyopathy possibly due to prior ETOH.  Initial echo in 11/16 showed EF 25-30%, cath in 2016 with nonobstructive CAD.  By 2021 and 2022 echoes, EF had improved to 45-50%.  Echo done in 1/24 showed significant drop in EF to 25-30% with normal RV, mild MR.  He no longer drinks ETOH and has not smoked for several years.  He does have a family history of  CAD. No chest pain and NYHA class II symptoms.  Not volume overloaded on exam.  He is on minimal GDMT, apparently  has trouble tolerating meds due to soft BP and renal dysfunction. BP today is actually elevated.  - Continue Coreg 6.25 mg bid.  - Off Farxiga due to "dehydration."  - Would start with addition of spironolactone 12.5 mg daily.  BMET/BNP now and again in 10 days.  - Will need slow and careful GDMT titration.  - We discussed RHC/LHC today given significant drop in EF.  He has RFs for CAD (family history, HTN, DM, prior smoking).  I think we need reassessment of coronaries.  We discussed risks/benefits and he agrees to procedure.  - If cath is unrevealing, would plan cardiac MRI.  2. CKD stage 3: Last creatinine 1.42 in 2/24.  BMET today.  3. H/o DVT: Related to PICC line, no longer on anticoagulation.  4. HTN: BP elevated today, though apparently BP has fallen with GDMT use in the past.   Followup 3-4 weeks.   Marca Ancona 07/09/2022

## 2022-07-09 NOTE — H&P (View-Only) (Signed)
PCP: Cable, James M, NP HF Cardiology: Dr. Johanne Mcglade  67 y.o. with history of CHF, HTN, CKD stage 3 who was referred to HF MD by Tina Hackney.  Patient has a long history of cardiomyopathy.  In 11/16, echo was done showing EF 25-30%.  Cath at that time showed nonobstructive CAD.  Over the years, his LV function improved, with echoes in 2021 and 2022 showing EF 45-50%. He was a prior heavy drinker but quit several years ago. He is also a prior smoker. ETOH was thought to contribute to his cardiomyopathy. He had a long admission in 10/22 with ruptured appendicitis and sepsis.  This admission was also complicated by splenic rupture and splenectomy.  GDMT in recent years appears to have been limited by low BP and renal dysfunction.    In 1/24, he had an echo done that showed EF 25-30% with mild LV dilation and mild LVH. This was a signficant fall from previous years.  He did not report any significant changes around this time.   Patient currently does not smoke cigarettes or drink, he does use marijuana occasionally.  No other drugs.  Patient walks 1 mile daily, he can be a little short of breath when the walk starts but this improves with time.  No chest pain. No orthopnea/PND.  He is more limited by right knee pain than anything else.  He can carry groceries into the house.  Of note, parents both had MIs in their 70s.   Labs (11/13): LDL 80 Labs (2/24): K 4.3, creatinine 1.42  ECG: NSR, LAFB, nonspecific anterolateral TWIs  PMH: 1. Hyperlipidemia 2. Gout 3. HTN 4. GI bleeding 5. Type 2 diabetes 6. H/o ETOH abuse: Quit drinking in 2022.  7. Central retinal artery occlusion: Left eye, 2015.  8. CKD stage 3 9. Appendicitis: 10/22 hospitalization with ruptured appendix, also complicated by splenic rupture needing splenectomy.  10. H/o LUE DVT: Thought to be related to PICC.  11. H/o TIA 12. Chronic systolic CHF: Echo in 11/16 with EF 25-30%, cath in 2016 with nonobstructive CAD.  - Echo (5/18): EF  35-40%.  - Echo (3/21): EF 35-40% - Echo (8/21): EF 45-50% - Echo (10/22): EF 45-50%, mild MR - Echo (3/24): EF 25-30%, mild LV dilation, mild LVH, normal RV, mild MR.  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  Social History   Socioeconomic History   Marital status: Single    Spouse name: Not on file   Number of children: 0   Years of education: 12   Highest education level: High school graduate  Occupational History   Occupation: Retired  Tobacco Use   Smoking status: Former    Packs/day: 0.75    Years: 30.00    Additional pack years: 0.00    Total pack years: 22.50    Types: Cigarettes    Quit date: 12/2020    Years since quitting: 1.5   Smokeless tobacco: Never  Vaping Use   Vaping Use: Never used  Substance and Sexual Activity   Alcohol use: Not Currently   Drug use: Not Currently   Sexual activity: Yes    Birth control/protection: Condom  Other Topics Concern   Not on file  Social History Narrative   Not on file   Social Determinants of Health   Financial Resource Strain: Low Risk  (07/31/2021)   Overall Financial Resource Strain (CARDIA)    Difficulty of Paying Living Expenses: Not hard at all  Food Insecurity: No Food Insecurity (07/31/2021)     Hunger Vital Sign    Worried About Running Out of Food in the Last Year: Never true    Ran Out of Food in the Last Year: Never true  Transportation Needs: No Transportation Needs (07/31/2021)   PRAPARE - Transportation    Lack of Transportation (Medical): No    Lack of Transportation (Non-Medical): No  Physical Activity: Sufficiently Active (07/31/2021)   Exercise Vital Sign    Days of Exercise per Week: 6 days    Minutes of Exercise per Session: 30 min  Stress: No Stress Concern Present (07/31/2021)   [employer] - Occupational Stress Questionnaire    Feeling of Stress : Not at all  Social Connections: Moderately Integrated (07/31/2021)   Social Connection and Isolation Panel [NHANES]     Frequency of Communication with Friends and Family: More than three times a week    Frequency of Social Gatherings with Friends and Family: More than three times a week    Attends Religious Services: More than 4 times per year    Active Member of Clubs or Organizations: Yes    Attends Club or Organization Meetings: More than 4 times per year    Marital Status: Never married  Intimate Partner Violence: Not At Risk (07/31/2021)   Humiliation, Afraid, Rape, and Kick questionnaire    Fear of Current or Ex-Partner: No    Emotionally Abused: No    Physically Abused: No    Sexually Abused: No   Family History  Problem Relation Age of Onset   Diabetes Mother    Hypertension Mother    Diabetes Father    Hypertension Father    Diabetes Sister    Dementia Brother    Cancer Neg Hx    Heart disease Neg Hx    Stroke Neg Hx    Colon cancer Neg Hx    Esophageal cancer Neg Hx    Rectal cancer Neg Hx    Stomach cancer Neg Hx    ROS: All systems reviewed and negative except as per HPI.   Current Outpatient Medications on File Prior to Visit  Medication Sig Dispense Refill   acetaminophen (TYLENOL) 500 MG tablet Take 2 tablets (1,000 mg total) by mouth every 8 (eight) hours as needed. 30 tablet 0   albuterol (VENTOLIN HFA) 108 (90 Base) MCG/ACT inhaler Inhale 2 puffs into the lungs every 6 (six) hours as needed for wheezing or shortness of breath.     allopurinol (ZYLOPRIM) 100 MG tablet TAKE ONE TABLET BY MOUTH TWICE A DAY 180 tablet 1   atorvastatin (LIPITOR) 20 MG tablet TAKE ONE TABLET BY MOUTH DAILY 100 tablet 1   Blood Pressure KIT Check b/p once daily. Based on insurance preference 1 kit 0   carvedilol (COREG) 6.25 MG tablet TAKE 1 TABLET BY MOUTH TWICE A DAY 180 tablet 2   cetirizine (ZYRTEC) 10 MG tablet Take 1 tablet (10 mg total) by mouth daily. 90 tablet 3   fenofibrate (TRICOR) 145 MG tablet TAKE 1 TABLET BY MOUTH ONCE A DAY 90 tablet 1   ferrous sulfate 325 (65 FE) MG tablet Take 1  tablet (325 mg total) by mouth daily with breakfast. 90 tablet 2   fluticasone (FLONASE) 50 MCG/ACT nasal spray Place 1-2 sprays into both nostrils daily as needed for allergies or rhinitis.     glimepiride (AMARYL) 1 MG tablet TAKE 1 TABLET BY MOUTH EVERY MORNING WITH BREAKFAST 90 tablet 0   hydroxypropyl methylcellulose / hypromellose (ISOPTO TEARS /   GONIOVISC) 2.5 % ophthalmic solution Place 1 drop into both eyes 3 (three) times daily as needed for dry eyes.     magnesium oxide (MAG-OX) 400 MG tablet Take 1 tablet (400 mg total) by mouth daily. 90 tablet 1   metFORMIN (GLUCOPHAGE) 500 MG tablet TAKE TWO TABLETS BY MOUTH TWICE A DAY WITH A MEAL 360 tablet 1   multivitamin (ONE-A-DAY MEN'S) TABS tablet Take 1 tablet by mouth daily with breakfast.     Omega-3 Fatty Acids (FISH OIL PO) Take 1 capsule by mouth daily. 2000 mg     pantoprazole (PROTONIX) 40 MG tablet TAKE ONE TABLET BY MOUTH ONCE A DAY 90 tablet 1   No current facility-administered medications on file prior to visit.   General: NAD Neck: No JVD, no thyromegaly or thyroid nodule.  Lungs: Clear to auscultation bilaterally with normal respiratory effort. CV: Nondisplaced PMI.  Heart regular S1/S2, no S3/S4, no murmur.  No peripheral edema.  No carotid bruit.  Normal pedal pulses.  Abdomen: Soft, nontender, no hepatosplenomegaly, no distention.  Skin: Intact without lesions or rashes.  Neurologic: Alert and oriented x 3.  Psych: Normal affect. Extremities: No clubbing or cyanosis.  HEENT: Normal.   Assessment/Plan: 1. Chronic systolic CHF: Long-standing, thought to be nonischemic cardiomyopathy possibly due to prior ETOH.  Initial echo in 11/16 showed EF 25-30%, cath in 2016 with nonobstructive CAD.  By 2021 and 2022 echoes, EF had improved to 45-50%.  Echo done in 1/24 showed significant drop in EF to 25-30% with normal RV, mild MR.  He no longer drinks ETOH and has not smoked for several years.  He does have a family history of  CAD. No chest pain and NYHA class II symptoms.  Not volume overloaded on exam.  He is on minimal GDMT, apparently  has trouble tolerating meds due to soft BP and renal dysfunction. BP today is actually elevated.  - Continue Coreg 6.25 mg bid.  - Off Farxiga due to "dehydration."  - Would start with addition of spironolactone 12.5 mg daily.  BMET/BNP now and again in 10 days.  - Will need slow and careful GDMT titration.  - We discussed RHC/LHC today given significant drop in EF.  He has RFs for CAD (family history, HTN, DM, prior smoking).  I think we need reassessment of coronaries.  We discussed risks/benefits and he agrees to procedure.  - If cath is unrevealing, would plan cardiac MRI.  2. CKD stage 3: Last creatinine 1.42 in 2/24.  BMET today.  3. H/o DVT: Related to PICC line, no longer on anticoagulation.  4. HTN: BP elevated today, though apparently BP has fallen with GDMT use in the past.   Followup 3-4 weeks.   Panagiota Perfetti 07/09/2022   

## 2022-07-09 NOTE — Patient Instructions (Signed)
START Spironolactone 12.5mg  daily  Routine lab work today. Will notify you of abnormal results  Repeat labs in 10 days  Follow up in 3-4 weeks  You are scheduled for Cardiac Catheterization on Tuesday April 23rd with Dr. Shirlee Latch.  Please arrive at the [facility] at 6:00a.m. on the day of your procedure.  1. DIET _X_ Nothing to eat or drink after midnight except your medications with a sip of water.  2. _X__ DO NOT TAKE these medications before your procedure:  DIABETES medications     _X__ YOU MAY TAKE ALL of your remaining medications with a small amount of water.  3. Plan for one night stay - bring personal belongings (i.e. toothpaste, toothbrush, etc.)  4. Bring a current list of your medications and current insurance cards.  5. Must have a responsible person to drive you home.  6. Someone must be with you for the first 24 hours after you arrive home.  7. Please wear clothes that are easy to get on and off and wear slip-on shoes.  * Special note: Every effort is made to have your procedure done on time. Occasionally there are emergencies that present themselves at the hospital that may cause delays. Please be patient if a delay does occur.  If you have any questions after you get home, please call the office at the number listed above.

## 2022-07-09 NOTE — Telephone Encounter (Signed)
Lvmtcb, sent mychart message  

## 2022-07-10 ENCOUNTER — Telehealth: Payer: Self-pay | Admitting: *Deleted

## 2022-07-14 ENCOUNTER — Other Ambulatory Visit: Payer: Self-pay | Admitting: Nurse Practitioner

## 2022-07-16 ENCOUNTER — Other Ambulatory Visit: Payer: Self-pay | Admitting: Nurse Practitioner

## 2022-07-16 MED ORDER — MAGNESIUM OXIDE 400 MG PO TABS: 400.0000 mg | ORAL_TABLET | Freq: Every day | ORAL | 1 refills | Status: DC

## 2022-07-17 ENCOUNTER — Encounter (HOSPITAL_COMMUNITY): Payer: Self-pay | Admitting: Cardiology

## 2022-07-17 ENCOUNTER — Ambulatory Visit (HOSPITAL_COMMUNITY)
Admission: RE | Admit: 2022-07-17 | Discharge: 2022-07-17 | Disposition: A | Discharge status: Home / Self Care | Payer: 59 | Attending: Cardiology | Admitting: Cardiology

## 2022-07-17 ENCOUNTER — Other Ambulatory Visit: Payer: Self-pay

## 2022-07-17 ENCOUNTER — Encounter (HOSPITAL_COMMUNITY)
Admission: RE | Disposition: A | Discharge status: Home / Self Care | Payer: Self-pay | Source: Home / Self Care | Attending: Cardiology

## 2022-07-17 ENCOUNTER — Other Ambulatory Visit (HOSPITAL_COMMUNITY): Payer: Self-pay

## 2022-07-17 DIAGNOSIS — I130 Hypertensive heart and chronic kidney disease with heart failure and stage 1 through stage 4 chronic kidney disease, or unspecified chronic kidney disease: Secondary | ICD-10-CM | POA: Diagnosis not present

## 2022-07-17 DIAGNOSIS — I5022 Chronic systolic (congestive) heart failure: Secondary | ICD-10-CM

## 2022-07-17 DIAGNOSIS — I2584 Coronary atherosclerosis due to calcified coronary lesion: Secondary | ICD-10-CM | POA: Insufficient documentation

## 2022-07-17 DIAGNOSIS — N183 Chronic kidney disease, stage 3 unspecified: Secondary | ICD-10-CM | POA: Insufficient documentation

## 2022-07-17 DIAGNOSIS — Z87891 Personal history of nicotine dependence: Secondary | ICD-10-CM | POA: Insufficient documentation

## 2022-07-17 DIAGNOSIS — Z86718 Personal history of other venous thrombosis and embolism: Secondary | ICD-10-CM | POA: Diagnosis not present

## 2022-07-17 DIAGNOSIS — Z7984 Long term (current) use of oral hypoglycemic drugs: Secondary | ICD-10-CM | POA: Diagnosis not present

## 2022-07-17 DIAGNOSIS — E1122 Type 2 diabetes mellitus with diabetic chronic kidney disease: Secondary | ICD-10-CM | POA: Diagnosis not present

## 2022-07-17 DIAGNOSIS — Z833 Family history of diabetes mellitus: Secondary | ICD-10-CM | POA: Diagnosis not present

## 2022-07-17 DIAGNOSIS — Z8249 Family history of ischemic heart disease and other diseases of the circulatory system: Secondary | ICD-10-CM | POA: Diagnosis not present

## 2022-07-17 DIAGNOSIS — Z79899 Other long term (current) drug therapy: Secondary | ICD-10-CM | POA: Diagnosis not present

## 2022-07-17 DIAGNOSIS — I2510 Atherosclerotic heart disease of native coronary artery without angina pectoris: Secondary | ICD-10-CM | POA: Insufficient documentation

## 2022-07-17 DIAGNOSIS — I429 Cardiomyopathy, unspecified: Secondary | ICD-10-CM | POA: Insufficient documentation

## 2022-07-17 DIAGNOSIS — I428 Other cardiomyopathies: Secondary | ICD-10-CM

## 2022-07-17 HISTORY — PX: RIGHT/LEFT HEART CATH AND CORONARY ANGIOGRAPHY: CATH118266

## 2022-07-17 LAB — POCT I-STAT EG7
Acid-Base Excess: 0 mmol/L (ref 0.0–2.0)
Acid-base deficit: 1 mmol/L (ref 0.0–2.0)
Bicarbonate: 25.1 mmol/L (ref 20.0–28.0)
Bicarbonate: 25.7 mmol/L (ref 20.0–28.0)
Calcium, Ion: 1.2 mmol/L (ref 1.15–1.40)
Calcium, Ion: 1.25 mmol/L (ref 1.15–1.40)
HCT: 42 % (ref 39.0–52.0)
HCT: 42 % (ref 39.0–52.0)
Hemoglobin: 14.3 g/dL (ref 13.0–17.0)
Hemoglobin: 14.3 g/dL (ref 13.0–17.0)
O2 Saturation: 70 %
O2 Saturation: 70 %
Potassium: 4 mmol/L (ref 3.5–5.1)
Potassium: 4.2 mmol/L (ref 3.5–5.1)
Sodium: 142 mmol/L (ref 135–145)
Sodium: 142 mmol/L (ref 135–145)
TCO2: 26 mmol/L (ref 22–32)
TCO2: 27 mmol/L (ref 22–32)
pCO2, Ven: 45.7 mmHg (ref 44–60)
pCO2, Ven: 46.3 mmHg (ref 44–60)
pH, Ven: 7.348 (ref 7.25–7.43)
pH, Ven: 7.352 (ref 7.25–7.43)
pO2, Ven: 39 mmHg (ref 32–45)
pO2, Ven: 39 mmHg (ref 32–45)

## 2022-07-17 LAB — GLUCOSE, CAPILLARY
Glucose-Capillary: 117 mg/dL — ABNORMAL HIGH (ref 70–99)
Glucose-Capillary: 110 mg/dL — ABNORMAL HIGH (ref 70–99)

## 2022-07-17 SURGERY — RIGHT/LEFT HEART CATH AND CORONARY ANGIOGRAPHY
Anesthesia: LOCAL

## 2022-07-17 MED ORDER — SODIUM CHLORIDE 0.9 % IV SOLN: 250.0000 mL | INTRAVENOUS | Status: DC | PRN

## 2022-07-17 MED ORDER — SODIUM CHLORIDE 0.9% FLUSH: 3.0000 mL | INTRAVENOUS | Status: DC | PRN

## 2022-07-17 MED ORDER — MIDAZOLAM HCL 2 MG/2ML IJ SOLN
INTRAMUSCULAR | Status: AC
  Filled 2022-07-17: qty 2

## 2022-07-17 MED ORDER — IOHEXOL 350 MG/ML SOLN
INTRAVENOUS | Status: DC | PRN
  Administered 2022-07-17: 35 mL

## 2022-07-17 MED ORDER — ATORVASTATIN CALCIUM 20 MG PO TABS: 40.0000 mg | ORAL_TABLET | Freq: Every day | ORAL | 1 refills | Status: DC

## 2022-07-17 MED ORDER — ONDANSETRON HCL 4 MG/2ML IJ SOLN: 4.0000 mg | Freq: Four times a day (QID) | INTRAMUSCULAR | Status: DC | PRN

## 2022-07-17 MED ORDER — SODIUM CHLORIDE 0.9 % IV SOLN: INTRAVENOUS | Status: AC

## 2022-07-17 MED ORDER — ASPIRIN 81 MG PO CHEW: 81.0000 mg | CHEWABLE_TABLET | Freq: Every day | ORAL | 11 refills | Status: AC

## 2022-07-17 MED ORDER — FENTANYL CITRATE (PF) 100 MCG/2ML IJ SOLN
INTRAMUSCULAR | Status: DC | PRN
  Administered 2022-07-17 (×2): 25 ug via INTRAVENOUS

## 2022-07-17 MED ORDER — HEPARIN SODIUM (PORCINE) 1000 UNIT/ML IJ SOLN
INTRAMUSCULAR | Status: AC
  Filled 2022-07-17: qty 10

## 2022-07-17 MED ORDER — ASPIRIN 81 MG PO CHEW
81.0000 mg | CHEWABLE_TABLET | ORAL | Status: AC
  Administered 2022-07-17: 81 mg via ORAL
  Filled 2022-07-17: qty 1

## 2022-07-17 MED ORDER — LABETALOL HCL 5 MG/ML IV SOLN: 10.0000 mg | INTRAVENOUS | Status: AC | PRN

## 2022-07-17 MED ORDER — LIDOCAINE HCL (PF) 1 % IJ SOLN
INTRAMUSCULAR | Status: DC | PRN
  Administered 2022-07-17 (×2): 2 mL via INTRADERMAL

## 2022-07-17 MED ORDER — ACETAMINOPHEN 325 MG PO TABS: 650.0000 mg | ORAL_TABLET | ORAL | Status: DC | PRN

## 2022-07-17 MED ORDER — VERAPAMIL HCL 2.5 MG/ML IV SOLN
INTRAVENOUS | Status: AC
  Filled 2022-07-17: qty 2

## 2022-07-17 MED ORDER — HEPARIN (PORCINE) IN NACL 1000-0.9 UT/500ML-% IV SOLN
INTRAVENOUS | Status: DC | PRN
  Administered 2022-07-17 (×2): 500 mL

## 2022-07-17 MED ORDER — MIDAZOLAM HCL 2 MG/2ML IJ SOLN
INTRAMUSCULAR | Status: DC | PRN
  Administered 2022-07-17 (×2): 1 mg via INTRAVENOUS

## 2022-07-17 MED ORDER — LIDOCAINE HCL (PF) 1 % IJ SOLN
INTRAMUSCULAR | Status: AC
  Filled 2022-07-17: qty 30

## 2022-07-17 MED ORDER — FENTANYL CITRATE (PF) 100 MCG/2ML IJ SOLN
INTRAMUSCULAR | Status: AC
  Filled 2022-07-17: qty 2

## 2022-07-17 MED ORDER — SODIUM CHLORIDE 0.9 % IV SOLN: INTRAVENOUS | Status: DC

## 2022-07-17 MED ORDER — VERAPAMIL HCL 2.5 MG/ML IV SOLN
INTRAVENOUS | Status: DC | PRN
  Administered 2022-07-17: 10 mL via INTRA_ARTERIAL

## 2022-07-17 MED ORDER — HYDRALAZINE HCL 20 MG/ML IJ SOLN: 10.0000 mg | INTRAMUSCULAR | Status: AC | PRN

## 2022-07-17 MED ORDER — SODIUM CHLORIDE 0.9% FLUSH: 3.0000 mL | Freq: Two times a day (BID) | INTRAVENOUS | Status: DC

## 2022-07-17 MED ORDER — HEPARIN SODIUM (PORCINE) 1000 UNIT/ML IJ SOLN
INTRAMUSCULAR | Status: DC | PRN
  Administered 2022-07-17: 4500 [IU] via INTRAVENOUS

## 2022-07-17 SURGICAL SUPPLY — 10 items
CATH BALLN WEDGE 5F 110CM (CATHETERS) IMPLANT
CATH INFINITI 5FR MULTPACK ANG (CATHETERS) IMPLANT
DEVICE RAD COMP TR BAND LRG (VASCULAR PRODUCTS) IMPLANT
GLIDESHEATH SLEND SS 6F .021 (SHEATH) IMPLANT
GUIDEWIRE INQWIRE 1.5J.035X260 (WIRE) IMPLANT
INQWIRE 1.5J .035X260CM (WIRE) ×1
KIT HEART LEFT (KITS) ×1 IMPLANT
PACK CARDIAC CATHETERIZATION (CUSTOM PROCEDURE TRAY) ×1 IMPLANT
SHEATH GLIDE SLENDER 4/5FR (SHEATH) IMPLANT
TRANSDUCER W/STOPCOCK (MISCELLANEOUS) ×1 IMPLANT

## 2022-07-17 NOTE — Interval H&P Note (Signed)
History and Physical Interval Note:  07/17/2022 7:46 AM  Travis [name]  has presented today for surgery, with the diagnosis of heart failure.  The various methods of treatment have been discussed with the patient and family. After consideration of risks, benefits and other options for treatment, the patient has consented to  Procedure(s): RIGHT/LEFT HEART CATH AND CORONARY ANGIOGRAPHY (N/A) as a surgical intervention.  The patient's history has been reviewed, patient examined, no change in status, stable for surgery.  I have reviewed the patient's chart and labs.  Questions were answered to the patient's satisfaction.     Cherri Yera [employer]

## 2022-07-17 NOTE — Discharge Instructions (Addendum)
Increase atorvastatin to 40 mg daily. Start aspirin 81 mg daily.   Radial Site Care Drink plenty of fluid for the next 3 days  Keep arm elevated for the next 24 hours The following information offers guidance on how to care for yourself after your procedure. Your health care provider may also give you more specific instructions. If you have problems or questions, contact your health care provider. What can I expect after the procedure? After the procedure, it is common to have bruising and tenderness in the incision area. Follow these instructions at home: Incision site care  Follow instructions from your health care provider about how to take care of your incision site. Make sure you:  Remove your dressing 24 hours after discharge.  Do not take baths, swim, or use a hot tub for 1 week. You may shower 24 hours after the procedure or as told by your health care provider. Remove the dressing and gently wash the incision area with plain soap and water. Pat the area dry with a clean towel. Do not rub the site. That could cause bleeding. Do not apply powder or lotion to the site. Check your incision site every day for signs of infection. Check for: Redness, swelling, or pain. Fluid or blood. Warmth. Pus or a bad smell. Activity For 24 hours after the procedure, or as directed by your health care provider: Do not flex or bend the affected arm. Do not push or pull heavy objects with the affected arm. Do not operate machinery or power tools. Do not drive. You should not drive yourself home from the hospital or clinic if you go home during that time period. You may drive 24 hours after the procedure unless your health care provider tells you not to. Do not lift anything that is heavier than 10 lb (4.5 kg), or the limit that you are told, until your health care provider says that it is safe. Return to your normal activities as told by your health care provider. Ask your health care provider  what activities are safe for you and when you can return to work. If you were given a sedative during the procedure, it can affect you for several hours. Do not drive or operate machinery until your health care provider says that it is safe. General instructions Take over-the-counter and prescription medicines only as told by your health care provider. If you will be going home right after the procedure, plan to have a responsible adult care for you for the time you are told. This is important. Keep all follow-up visits. This is important. Contact a health care provider if: You have a fever or chills. You have any of these signs of infection at your incision site: Redness, swelling, or pain. Fluid or blood. Warmth. Pus or a bad smell. Get help right away if: The incision area swells very fast. The incision area is bleeding, and the bleeding does not stop when you hold steady pressure on the area. Your arm or hand becomes pale, cool, tingly, or numb. These symptoms may represent a serious problem that is an emergency. Do not wait to see if the symptoms will go away. Get medical help right away. Call your local emergency services (911 in the [place] Do not drive yourself to the hospital. Summary After the procedure, it is common to have bruising and tenderness at the incision site. Follow instructions from your health care provider about how to take care of your radial site incision. Check the  incision every day for signs of infection. Do not lift anything that is heavier than 10 lb (4.5 kg), or the limit that you are told, until your health care provider says that it is safe. Get help right away if the incision area swells very fast, you have bleeding at the incision site that will not stop, or your arm or hand becomes pale, cool, or numb. This information is not intended to replace advice given to you by your health care provider. Make sure you discuss any questions you have with your health  care provider. Document Revised: 05/01/2020 Document Reviewed: 05/01/2020 Elsevier Patient Education  2023 [employer].

## 2022-07-26 ENCOUNTER — Telehealth: Payer: Self-pay

## 2022-07-26 DIAGNOSIS — I5022 Chronic systolic (congestive) heart failure: Secondary | ICD-10-CM

## 2022-07-26 NOTE — Telephone Encounter (Signed)
Pt missed 10 day repeat lab work after last appt. Called pt to inquire if they could have lab work done before appt at [facility] failure clinic on 07/30/22. Pt agreed and states he will come for lab work on 07/27/22 and confirmed his appt for 5/6. BMET and BNP order placed at pt's previous appt.

## 2022-07-27 ENCOUNTER — Other Ambulatory Visit
Admission: RE | Admit: 2022-07-27 | Discharge: 2022-07-27 | Disposition: A | Discharge status: Home / Self Care | Payer: 59 | Attending: Cardiology | Admitting: Cardiology

## 2022-07-27 DIAGNOSIS — I5022 Chronic systolic (congestive) heart failure: Secondary | ICD-10-CM | POA: Insufficient documentation

## 2022-07-27 LAB — BASIC METABOLIC PANEL
Anion gap: 11 (ref 5–15)
BUN: 24 mg/dL — ABNORMAL HIGH (ref 8–23)
CO2: 23 mmol/L (ref 22–32)
Calcium: 9.4 mg/dL (ref 8.9–10.3)
Chloride: 107 mmol/L (ref 98–111)
Creatinine, Ser: 1.57 mg/dL — ABNORMAL HIGH (ref 0.61–1.24)
GFR, Estimated: 48 mL/min — ABNORMAL LOW (ref >60)
Glucose, Bld: 171 mg/dL — ABNORMAL HIGH (ref 70–99)
Potassium: 4.1 mmol/L (ref 3.5–5.1)
Sodium: 141 mmol/L (ref 135–145)

## 2022-07-30 ENCOUNTER — Encounter: Payer: Self-pay | Admitting: Cardiology

## 2022-07-30 ENCOUNTER — Ambulatory Visit: Payer: 59 | Attending: Cardiology | Admitting: Cardiology

## 2022-07-30 ENCOUNTER — Other Ambulatory Visit (HOSPITAL_COMMUNITY): Payer: Self-pay

## 2022-07-30 VITALS — BP 135/84 | HR 86 | Wt 198.6 lb

## 2022-07-30 DIAGNOSIS — I5022 Chronic systolic (congestive) heart failure: Secondary | ICD-10-CM | POA: Diagnosis not present

## 2022-07-30 MED ORDER — ENTRESTO 24-26 MG PO TABS: 1.0000 | ORAL_TABLET | Freq: Two times a day (BID) | ORAL | 3 refills | Status: DC

## 2022-07-30 MED ORDER — SPIRONOLACTONE 25 MG PO TABS: 12.5000 mg | ORAL_TABLET | Freq: Every day | ORAL | 3 refills | Status: DC

## 2022-07-30 MED ORDER — ENTRESTO 24-26 MG PO TABS: 1.0000 | ORAL_TABLET | Freq: Two times a day (BID) | ORAL | 6 refills | Status: DC

## 2022-07-30 NOTE — Patient Instructions (Signed)
Medication Changes:  Restart taking your Spironolactone 12.5 mg (0.5 tablet) daily at bedtime.  Start taking Entresto 24/26 mg (1 tablet) 2 times a day.  Lab Work:  You will have lab work to complete in 10 days please come next week around 5/16 to complete your lab work.  your results will be available in MyChart, we will contact you for abnormal readings.      Special Instructions // Education:  Do the following things EVERYDAY: Weigh yourself in the morning before breakfast. Write it down and keep it in a log. Take your medicines as prescribed Eat low salt foods--Limit salt (sodium) to 2000 mg per day.  Stay as active as you can everyday Limit all fluids for the day to less than 2 liters   Please keep hydrated while taking Entresto.  Follow-Up in: follow up in 6 weeks with Dr. Shirlee Latch.    If you have any questions or concerns before your next appointment please send [place] a message through [place] or call our office at [phone] Monday-Friday 8 am-5 pm.   If you have an urgent need after hours on the weekend please call your Primary Cardiologist or the [facility] in [place] at [phone].

## 2022-07-30 NOTE — Progress Notes (Signed)
PCP: Eden Emms, NP HF Cardiology: Dr. Shirlee Latch  67 y.o. with history of CHF, HTN, CKD stage 3 who was referred to HF MD by Clarisa Kindred.  Patient has a long history of cardiomyopathy.  In 11/16, echo was done showing EF 25-30%.  Cath at that time showed nonobstructive CAD.  Over the years, his LV function improved, with echoes in 2021 and 2022 showing EF 45-50%. He was a prior heavy drinker but quit several years ago. He is also a prior smoker. ETOH was thought to contribute to his cardiomyopathy. He had a long admission in 10/22 with ruptured appendicitis and sepsis.  This admission was also complicated by splenic rupture and splenectomy.  GDMT in recent years appears to have been limited by low BP and renal dysfunction.    In 1/24, he had an echo done that showed EF 25-30% with mild LV dilation and mild LVH. This was a signficant fall from previous years.  He did not report any significant changes around this time. LHC/RHC in 4/24 showed 60% pLAD stenosis, 40% D1; normal filling pressures and CI 2.61.   Patient currently does not smoke cigarettes or drink, he does use marijuana occasionally.  No other drugs.    He returns for followup of CHF.  He is doing well symptomatically, walks 1 mile/day.  No lightheadedness.  No significant exertional dyspnea.  No chest pain. No orthopnea/PND.  No palpitations.  Weight down 3 lbs.   Labs (11/13): LDL 80 Labs (2/24): K 4.3, creatinine 1.42 Labs (5/24): K 4.1, creatinine 1.57  PMH: 1. Hyperlipidemia 2. Gout 3. HTN 4. GI bleeding 5. Type 2 diabetes 6. H/o ETOH abuse: Quit drinking in 2022.  7. Central retinal artery occlusion: Left eye, 2015.  8. CKD stage 3 9. Appendicitis: 10/22 hospitalization with ruptured appendix, also complicated by splenic rupture needing splenectomy.  10. H/o LUE DVT: Thought to be related to PICC.  11. H/o TIA 12. Chronic systolic CHF: Echo in 11/16 with EF 25-30%, cath in 2016 and 4/24with nonobstructive CAD.  - Echo  (5/18): EF 35-40%.  - Echo (3/21): EF 35-40% - Echo (8/21): EF 45-50% - Echo (10/22): EF 45-50%, mild MR - Echo (3/24): EF 25-30%, mild LV dilation, mild LVH, normal RV, mild MR.  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 CAD: Cath (4/24) with 60% pLAD, 40% D1.   Social History   Socioeconomic History   Marital status: Single    Spouse name: Not on file   Number of children: 0   Years of education: 12   Highest education level: High school graduate  Occupational History   Occupation: Retired  Tobacco Use   Smoking status: Former    Packs/day: 0.75    Years: 30.00    Additional pack years: 0.00    Total pack years: 22.50    Types: Cigarettes    Quit date: 12/2020    Years since quitting: 1.5   Smokeless tobacco: Never  Vaping Use   Vaping Use: Never used  Substance and Sexual Activity   Alcohol use: Not Currently   Drug use: Not Currently   Sexual activity: Yes    Birth control/protection: Condom  Other Topics Concern   Not on file  Social History Narrative   Not on file   Social Determinants of Health   Financial Resource Strain: Low Risk  (07/31/2021)   Overall Financial Resource Strain (CARDIA)  Difficulty of Paying Living Expenses: Not hard at all  Food Insecurity: No Food Insecurity (07/31/2021)   Hunger Vital Sign    Worried About Running Out of Food in the Last Year: Never true    Ran Out of Food in the Last Year: Never true  Transportation Needs: No Transportation Needs (07/31/2021)   PRAPARE - Administrator, Civil Service (Medical): No    Lack of Transportation (Non-Medical): No  Physical Activity: Sufficiently Active (07/31/2021)   Exercise Vital Sign    Days of Exercise per Week: 6 days    Minutes of Exercise per Session: 30 min  Stress: No Stress Concern Present (07/31/2021)   [employer] of Occupational Health - Occupational Stress Questionnaire    Feeling of Stress : Not  at all  Social Connections: Moderately Integrated (07/31/2021)   Social Connection and Isolation Panel [NHANES]    Frequency of Communication with Friends and Family: More than three times a week    Frequency of Social Gatherings with Friends and Family: More than three times a week    Attends Religious Services: More than 4 times per year    Active Member of [employer] or Organizations: Yes    Attends Engineer, structural: More than 4 times per year    Marital Status: Never married  Intimate Partner Violence: Not At Risk (07/31/2021)   Humiliation, Afraid, Rape, and Kick questionnaire    Fear of Current or Ex-Partner: No    Emotionally Abused: No    Physically Abused: No    Sexually Abused: No   Family History  Problem Relation Age of Onset   Diabetes Mother    Hypertension Mother    Diabetes Father    Hypertension Father    Diabetes Sister    Dementia Brother    Cancer Neg Hx    Heart disease Neg Hx    Stroke Neg Hx    Colon cancer Neg Hx    Esophageal cancer Neg Hx    Rectal cancer Neg Hx    Stomach cancer Neg Hx    ROS: All systems reviewed and negative except as per HPI.   Current Outpatient Medications on File Prior to Visit  Medication Sig Dispense Refill   acetaminophen (TYLENOL) 500 MG tablet Take 2 tablets (1,000 mg total) by mouth every 8 (eight) hours as needed. 30 tablet 0   allopurinol (ZYLOPRIM) 100 MG tablet TAKE ONE TABLET BY MOUTH TWICE A DAY 180 tablet 1   aspirin (ASPIRIN CHILDRENS) 81 MG chewable tablet Chew 1 tablet (81 mg total) by mouth daily. (Patient taking differently: Chew 81 mg by mouth in the morning and at bedtime.) 36 tablet 11   atorvastatin (LIPITOR) 20 MG tablet Take 2 tablets (40 mg total) by mouth daily. 100 tablet 1   Blood Pressure KIT Check b/p once daily. Based on insurance preference 1 kit 0   carvedilol (COREG) 6.25 MG tablet TAKE 1 TABLET BY MOUTH TWICE A DAY 180 tablet 2   cetirizine (ZYRTEC) 10 MG tablet Take 1 tablet (10 mg  total) by mouth daily. 90 tablet 3   diphenhydrAMINE (BENADRYL) 25 MG tablet Take 25 mg by mouth daily as needed for allergies.     fenofibrate (TRICOR) 145 MG tablet TAKE 1 TABLET BY MOUTH ONCE A DAY 90 tablet 1   ferrous sulfate 325 (65 FE) MG tablet Take 1 tablet (325 mg total) by mouth daily with breakfast. 90 tablet 2   fluticasone (FLONASE) 50 MCG/ACT  nasal spray Place 1-2 sprays into both nostrils daily as needed for allergies or rhinitis.     glimepiride (AMARYL) 1 MG tablet TAKE 1 TABLET BY MOUTH EVERY MORNING WITH BREAKFAST 90 tablet 0   hydroxypropyl methylcellulose / hypromellose (ISOPTO TEARS / GONIOVISC) 2.5 % ophthalmic solution Place 1 drop into both eyes 2 (two) times daily.     magnesium oxide (MAG-OX) 400 MG tablet Take 1 tablet (400 mg total) by mouth daily. 90 tablet 1   metFORMIN (GLUCOPHAGE) 500 MG tablet TAKE TWO TABLETS BY MOUTH TWICE A DAY WITH A MEAL 360 tablet 1   multivitamin (ONE-A-DAY MEN'S) TABS tablet Take 1 tablet by mouth daily with breakfast.     Omega-3 Fatty Acids (FISH OIL PO) Take 1 capsule by mouth daily.     pantoprazole (PROTONIX) 40 MG tablet TAKE ONE TABLET BY MOUTH ONCE A DAY 90 tablet 1   albuterol (VENTOLIN HFA) 108 (90 Base) MCG/ACT inhaler Inhale 2 puffs into the lungs every 6 (six) hours as needed for wheezing or shortness of breath. (Patient not taking: Reported on 07/30/2022)     No current facility-administered medications on file prior to visit.   General: NAD Neck: No JVD, no thyromegaly or thyroid nodule.  Lungs: Clear to auscultation bilaterally with normal respiratory effort. CV: Nondisplaced PMI.  Heart regular S1/S2, no S3/S4, no murmur.  No peripheral edema.  No carotid bruit.  Normal pedal pulses.  Abdomen: Soft, nontender, no hepatosplenomegaly, no distention.  Skin: Intact without lesions or rashes.  Neurologic: Alert and oriented x 3.  Psych: Normal affect. Extremities: No clubbing or cyanosis.  HEENT: Normal.    Assessment/Plan: 1. Chronic systolic CHF: Long-standing, thought to be nonischemic cardiomyopathy possibly due to prior ETOH.  Initial echo in 11/16 showed EF 25-30%, cath in 2016 with nonobstructive CAD.  By 2021 and 2022 echoes, EF had improved to 45-50%.  Echo done in 1/24 showed significant drop in EF to 25-30% with normal RV, mild MR.  He no longer drinks ETOH and has not smoked for several years.  Repeat cath in 4/24 showed nonobstructive CAD.  Not volume overloaded on exam.  NYHA class I.  - Continue Coreg 6.25 mg bid.  - Off Farxiga due to "dehydration."  - Continue spironolactone 12.5 daily.  - Add Entresto 24/26 bid. BMET today and again in 10 days.   - Will need slow and careful GDMT titration with history of dehydration and orthostasis.  - Cardiac MRI to look for infiltrative disease/myocarditis.  2. CKD stage 3: BMET today.  3. H/o DVT: Related to PICC line, no longer on anticoagulation.  4. CAD: Cath 4/24 with 60% pLAD, 40% D1.   - Continue ASA 81 - Continue atorvastatin 40 mg daily.  Check lipids next appt.   Followup 6 weeks.   Marca Ancona 07/30/2022

## 2022-08-02 ENCOUNTER — Other Ambulatory Visit (HOSPITAL_COMMUNITY): Payer: Self-pay

## 2022-08-02 ENCOUNTER — Telehealth: Payer: Self-pay | Admitting: Cardiovascular Disease

## 2022-08-02 ENCOUNTER — Ambulatory Visit (INDEPENDENT_AMBULATORY_CARE_PROVIDER_SITE_OTHER): Payer: 59

## 2022-08-02 VITALS — Ht 76.0 in | Wt 198.0 lb

## 2022-08-02 DIAGNOSIS — Z0000 Encounter for general adult medical examination without abnormal findings: Secondary | ICD-10-CM | POA: Diagnosis not present

## 2022-08-02 MED ORDER — SPIRONOLACTONE 25 MG PO TABS: 12.5000 mg | ORAL_TABLET | Freq: Every day | ORAL | 3 refills | Status: DC

## 2022-08-02 NOTE — Telephone Encounter (Signed)
Patient returning call.

## 2022-08-02 NOTE — Patient Instructions (Signed)
[name] , Thank you for taking time to come for your Medicare Wellness Visit. I appreciate your ongoing commitment to your health goals. Please review the following plan we discussed and let me know if I can assist you in the future.   These are the goals we discussed:  Goals       Increase physical activity (pt-stated)      I would like to walk more      Patient Stated      No new goals        This is a list of the screening recommended for you and due dates:  Health Maintenance  Topic Date Due   Meningitis B Vaccine (1 of 4 - Increased Risk) 01/11/1966   Screening for Lung Cancer  01/12/2022   COVID-19 Vaccine (5 - 2023-24 season) 03/12/2022   Eye exam for diabetics  06/21/2022   Zoster (Shingles) Vaccine (1 of 2) 01/18/2025*   Flu Shot  10/25/2022   Complete foot exam   11/01/2022   Yearly kidney health urinalysis for diabetes  11/02/2022   Hemoglobin A1C  11/09/2022   Yearly kidney function blood test for diabetes  07/27/2023   Medicare Annual Wellness Visit  08/02/2023   Colon Cancer Screening  08/28/2026   DTaP/Tdap/Td vaccine (2 - Td or Tdap) 11/12/2026   Pneumonia Vaccine  Completed   Hepatitis C Screening: USPSTF Recommendation to screen - Ages 52-79 yo.  Completed   HPV Vaccine  Aged Out  *Topic was postponed. The date shown is not the original due date.    Advanced directives: on file in the chart  Conditions/risks identified: none  Next appointment: Follow up in one year for your annual wellness visit. 08/06/23 @ 9:00 televisit  Preventive Care 67 Years and Older, Male  Preventive care refers to lifestyle choices and visits with your health care provider that can promote health and wellness. What does preventive care include? A yearly physical exam. This is also called an annual well check. Dental exams once or twice a year. Routine eye exams. Ask your health care provider how often you should have your eyes checked. Personal lifestyle choices,  including: Daily care of your teeth and gums. Regular physical activity. Eating a healthy diet. Avoiding tobacco and drug use. Limiting alcohol use. Practicing safe sex. Taking low doses of aspirin every day. Taking vitamin and mineral supplements as recommended by your health care provider. What happens during an annual well check? The services and screenings done by your health care provider during your annual well check will depend on your age, overall health, lifestyle risk factors, and family history of disease. Counseling  Your health care provider may ask you questions about your: Alcohol use. Tobacco use. Drug use. Emotional well-being. Home and relationship well-being. Sexual activity. Eating habits. History of falls. Memory and ability to understand (cognition). Work and work Astronomer. Screening  You may have the following tests or measurements: Height, weight, and BMI. Blood pressure. Lipid and cholesterol levels. These may be checked every 5 years, or more frequently if you are over 75 years old. Skin check. Lung cancer screening. You may have this screening every year starting at age 66 if you have a 30-pack-year history of smoking and currently smoke or have quit within the past 15 years. Fecal occult blood test (FOBT) of the stool. You may have this test every year starting at age 14. Flexible sigmoidoscopy or colonoscopy. You may have a sigmoidoscopy every 5 years or a  colonoscopy every 10 years starting at age 61. Prostate cancer screening. Recommendations will vary depending on your family history and other risks. Hepatitis C blood test. Hepatitis B blood test. Sexually transmitted disease (STD) testing. Diabetes screening. This is done by checking your blood sugar (glucose) after you have not eaten for a while (fasting). You may have this done every 1-3 years. Abdominal aortic aneurysm (AAA) screening. You may need this if you are a current or former  smoker. Osteoporosis. You may be screened starting at age 61 if you are at high risk. Talk with your health care provider about your test results, treatment options, and if necessary, the need for more tests. Vaccines  Your health care provider may recommend certain vaccines, such as: Influenza vaccine. This is recommended every year. Tetanus, diphtheria, and acellular pertussis (Tdap, Td) vaccine. You may need a Td booster every 10 years. Zoster vaccine. You may need this after age 107. Pneumococcal 13-valent conjugate (PCV13) vaccine. One dose is recommended after age 58. Pneumococcal polysaccharide (PPSV23) vaccine. One dose is recommended after age 22. Talk to your health care provider about which screenings and vaccines you need and how often you need them. This information is not intended to replace advice given to you by your health care provider. Make sure you discuss any questions you have with your health care provider. Document Released: 04/08/2015 Document Revised: 11/30/2015 Document Reviewed: 01/11/2015 Elsevier Interactive Patient Education  2017 [place] Prevention in the Home Falls can cause injuries. They can happen to people of 67 ages. There are many things you can do to make your home safe and to help prevent falls. What can I do on the outside of my home? Regularly fix the edges of walkways and driveways and fix any cracks. Remove anything that might make you trip as you walk through a door, such as a raised step or threshold. Trim any bushes or trees on the path to your home. Use bright outdoor lighting. Clear any walking paths of anything that might make someone trip, such as rocks or tools. Regularly check to see if handrails are loose or broken. Make sure that both sides of any steps have handrails. Any raised decks and porches should have guardrails on the edges. Have any leaves, snow, or ice cleared regularly. Use sand or salt on walking paths during  winter. Clean up any spills in your garage right away. This includes oil or grease spills. What can I do in the bathroom? Use night lights. Install grab bars by the toilet and in the tub and shower. Do not use towel bars as grab bars. Use non-skid mats or decals in the tub or shower. If you need to sit down in the shower, use a plastic, non-slip stool. Keep the floor dry. Clean up any water that spills on the floor as soon as it happens. Remove soap buildup in the tub or shower regularly. Attach bath mats securely with double-sided non-slip rug tape. Do not have throw rugs and other things on the floor that can make you trip. What can I do in the bedroom? Use night lights. Make sure that you have a light by your bed that is easy to reach. Do not use any sheets or blankets that are too big for your bed. They should not hang down onto the floor. Have a firm chair that has side arms. You can use this for support while you get dressed. Do not have throw rugs and other things  on the floor that can make you trip. What can I do in the kitchen? Clean up any spills right away. Avoid walking on wet floors. Keep items that you use a lot in easy-to-reach places. If you need to reach something above you, use a strong step stool that has a grab bar. Keep electrical cords out of the way. Do not use floor polish or wax that makes floors slippery. If you must use wax, use non-skid floor wax. Do not have throw rugs and other things on the floor that can make you trip. What can I do with my stairs? Do not leave any items on the stairs. Make sure that there are handrails on both sides of the stairs and use them. Fix handrails that are broken or loose. Make sure that handrails are as long as the stairways. Check any carpeting to make sure that it is firmly attached to the stairs. Fix any carpet that is loose or worn. Avoid having throw rugs at the top or bottom of the stairs. If you do have throw rugs,  attach them to the floor with carpet tape. Make sure that you have a light switch at the top of the stairs and the bottom of the stairs. If you do not have them, ask someone to add them for you. What else can I do to help prevent falls? Wear shoes that: Do not have high heels. Have rubber bottoms. Are comfortable and fit you well. Are closed at the toe. Do not wear sandals. If you use a stepladder: Make sure that it is fully opened. Do not climb a closed stepladder. Make sure that both sides of the stepladder are locked into place. Ask someone to hold it for you, if possible. Clearly mark and make sure that you can see: Any grab bars or handrails. First and last steps. Where the edge of each step is. Use tools that help you move around (mobility aids) if they are needed. These include: Canes. Walkers. Scooters. Crutches. Turn on the lights when you go into a dark area. Replace any light bulbs as soon as they burn out. Set up your furniture so you have a clear path. Avoid moving your furniture around. If any of your floors are uneven, fix them. If there are any pets around you, be aware of where they are. Review your medicines with your doctor. Some medicines can make you feel dizzy. This can increase your chance of falling. Ask your doctor what other things that you can do to help prevent falls. This information is not intended to replace advice given to you by your health care provider. Make sure you discuss any questions you have with your health care provider. Document Released: 01/06/2009 Document Revised: 08/18/2015 Document Reviewed: 04/16/2014 Elsevier Interactive Patient Education  2017 [employer].

## 2022-08-02 NOTE — Telephone Encounter (Signed)
Pt c/o medication issue:  1. Name of Medication:   spironolactone (ALDACTONE) 25 MG tablet   2. How are you currently taking this medication (dosage and times per day)?   As prescribed  3. Are you having a reaction (difficulty breathing--STAT)?   No  4. What is your medication issue?   Patient wants call back to confirm instructions for this medication.

## 2022-08-02 NOTE — Progress Notes (Signed)
I connected with  Carola Rhine on 08/02/22 by a audio enabled telemedicine application and verified that I am speaking with the correct person using two identifiers.  Patient Location: Home  Provider Location: Home Office  I discussed the limitations of evaluation and management by telemedicine. The patient expressed understanding and agreed to proceed.  Subjective:   Travis [name] is a 67 y.o. male who presents for Medicare Annual/Subsequent preventive examination.  Review of Systems      Cardiac Risk Factors include: advanced age (>50men, >80 women);hypertension;male gender;diabetes mellitus     Objective:    Today's Vitals   08/02/22 0922 08/02/22 0923  Weight: 198 lb (89.8 kg)   Height: 6\' 4"  (1.93 m)   PainSc:  5    Body mass index is 24.1 kg/m.     08/02/2022    9:39 AM 07/17/2022    6:23 AM 12/20/2021    2:33 PM 12/20/2021    2:30 PM 07/31/2021   10:17 AM 02/05/2021    6:00 PM 10/02/2020   12:22 PM  Advanced Directives  Does Patient Have a Medical Advance Directive? Yes No Yes No Yes Yes No  Type of Estate agent of [place];Living will  Living will;Healthcare Power of [employer] Power of [place];Living will Healthcare Power of Attorney   Does patient want to make changes to medical advance directive? No - Patient declined  No - Patient declined No - Patient declined No - Patient declined No - Patient declined   Copy of Healthcare Power of Attorney in Chart? Yes - validated most recent copy scanned in chart (See row information)  No - copy requested  No - copy requested No - copy requested   Would patient like information on creating a medical advance directive? No - Patient declined No - Patient declined No - Patient declined No - Patient declined   No - Patient declined    Current Medications (verified) Outpatient Encounter Medications as of 08/02/2022  Medication Sig   acetaminophen (TYLENOL) 500 MG tablet Take 2 tablets (1,000 mg  total) by mouth every 8 (eight) hours as needed.   allopurinol (ZYLOPRIM) 100 MG tablet TAKE ONE TABLET BY MOUTH TWICE A DAY   aspirin (ASPIRIN CHILDRENS) 81 MG chewable tablet Chew 1 tablet (81 mg total) by mouth daily. (Patient taking differently: Chew 81 mg by mouth in the morning and at bedtime.)   atorvastatin (LIPITOR) 20 MG tablet Take 2 tablets (40 mg total) by mouth daily.   Blood Pressure KIT Check b/p once daily. Based on insurance preference   carvedilol (COREG) 6.25 MG tablet TAKE 1 TABLET BY MOUTH TWICE A DAY   cetirizine (ZYRTEC) 10 MG tablet Take 1 tablet (10 mg total) by mouth daily.   diphenhydrAMINE (BENADRYL) 25 MG tablet Take 25 mg by mouth daily as needed for allergies.   fenofibrate (TRICOR) 145 MG tablet TAKE 1 TABLET BY MOUTH ONCE A DAY   ferrous sulfate 325 (65 FE) MG tablet Take 1 tablet (325 mg total) by mouth daily with breakfast.   fluticasone (FLONASE) 50 MCG/ACT nasal spray Place 1-2 sprays into both nostrils daily as needed for allergies or rhinitis.   glimepiride (AMARYL) 1 MG tablet TAKE 1 TABLET BY MOUTH EVERY MORNING WITH BREAKFAST   hydroxypropyl methylcellulose / hypromellose (ISOPTO TEARS / GONIOVISC) 2.5 % ophthalmic solution Place 1 drop into both eyes 2 (two) times daily.   magnesium oxide (MAG-OX) 400 MG tablet Take 1 tablet (400 mg total)  by mouth daily.   metFORMIN (GLUCOPHAGE) 500 MG tablet TAKE TWO TABLETS BY MOUTH TWICE A DAY WITH A MEAL   multivitamin (ONE-A-DAY MEN'S) TABS tablet Take 1 tablet by mouth daily with breakfast.   Omega-3 Fatty Acids (FISH OIL PO) Take 1 capsule by mouth daily.   pantoprazole (PROTONIX) 40 MG tablet TAKE ONE TABLET BY MOUTH ONCE A DAY   sacubitril-valsartan (ENTRESTO) 24-26 MG Take 1 tablet by mouth 2 (two) times daily.   spironolactone (ALDACTONE) 25 MG tablet Take 0.5 tablets (12.5 mg total) by mouth daily.   albuterol (VENTOLIN HFA) 108 (90 Base) MCG/ACT inhaler Inhale 2 puffs into the lungs every 6 (six) hours as  needed for wheezing or shortness of breath. (Patient not taking: Reported on 07/30/2022)   [DISCONTINUED] allopurinol (ZYLOPRIM) 100 MG tablet TAKE 1 TABLET BY MOUTH TWICE A DAY   [DISCONTINUED] atorvastatin (LIPITOR) 20 MG tablet TAKE 1 TABLET BY MOUTH DAILY   [DISCONTINUED] magnesium oxide (MAG-OX) 400 MG tablet Take 1 tablet (400 mg total) by mouth daily.   [DISCONTINUED] metFORMIN (GLUCOPHAGE) 500 MG tablet TAKE 2 TABLETS BY MOUTH TWICE A DAY WITHA MEAL   [DISCONTINUED] pantoprazole (PROTONIX) 40 MG tablet TAKE 1 TABLET BY MOUTH ONCE A DAY   No facility-administered encounter medications on file as of 08/02/2022.    Allergies (verified) Farxiga [dapagliflozin]   History: Past Medical History:  Diagnosis Date   Alcohol abuse    Alcoholic cardiomyopathy (HCC) 40/98/1191   a. 05/2019 Echo: EF 35-40%; b. 10/2019 Echo: EF 45-50%.   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 12/2010 Echo: EF 20-25%; b 08/2013 Echo: EF 45-50%; c. 01/2015 Echo: EF 25-30%; d. 07/2016 Echo: EF 35-40%; e. 05/2019 Echo: EF 35-40%; d. 10/2019 Echo: EF 45-50%, mild LVH, mild red RV fxn, mildl dil RA, Triv MR. Mild to mod Ao Sclerosis w/o stenosis.   Hyperlipidemia    Hypertension    Nonischemic cardiomyopathy (HCC)    Nonobstructive Coronary artery disease    a. 01/2015 Cath: LM nl, LAD 30p/m, LCX nl, RCA 10p/m, RPDA min irregs.   Osteoarthritis    Pneumonia    TIA (transient ischemic attack)    Tobacco abuse    Vision loss    peripherial vision only left eye.Central Retinal  artery occusion    Past Surgical History:  Procedure Laterality Date   BIOPSY  11/25/2019   Procedure: BIOPSY;  Surgeon: Meryl Dare, MD;   Location: [facility] ENDOSCOPY;  Service: Endoscopy;;   CARDIAC CATHETERIZATION     CARDIAC CATHETERIZATION N/A 01/28/2015   Procedure: Left Heart Cath and Coronary Angiography;  Surgeon: Iran Ouch, MD;  Location: [facility] INVASIVE CV LAB;  Service: Cardiovascular;  Laterality: N/A;   COLONOSCOPY     ESOPHAGOGASTRODUODENOSCOPY (EGD) WITH PROPOFOL N/A 11/25/2019   Procedure: ESOPHAGOGASTRODUODENOSCOPY (EGD) WITH PROPOFOL;  Surgeon: Meryl Dare, MD;  Location: [facility] ENDOSCOPY;  Service: Endoscopy;  Laterality: N/A;   HIP ARTHROPLASTY Right 03/15/2013   Procedure: ARTHROPLASTY BIPOLAR HIP;  Surgeon: Kathryne Hitch, MD;  Location: [facility] OR;  Service: Orthopedics;  Laterality: Right;   LAPAROSCOPIC APPENDECTOMY N/A 01/01/2021   Procedure: DIAGNOSTIC LAPAROSCOPY; EXPLORATORY LAPAROTOMY, ILEUCECECTOMY, DRAINAGE OF ABDOMINAL ABSCESS;  Surgeon: Violeta Gelinas, MD;  Location: [facility] OR;  Service: General;  Laterality: N/A;   RIGHT/LEFT HEART CATH AND CORONARY ANGIOGRAPHY N/A 07/17/2022   Procedure: RIGHT/LEFT HEART CATH AND CORONARY ANGIOGRAPHY;  Surgeon: Laurey Morale, MD;  Location: [facility] INVASIVE CV LAB;  Service: Cardiovascular;  Laterality: N/A;   SPLENECTOMY, TOTAL N/A 01/06/2021   Procedure: SPLENECTOMY;  Surgeon: Diamantina Monks, MD;  Location: [facility] OR;  Service: General;  Laterality: N/A;   TOTAL SHOULDER ARTHROPLASTY Right 09/01/2015   Procedure: RIGHT TOTAL SHOULDER ARTHROPLASTY;  Surgeon: Francena Hanly, MD;  Location: [facility] OR;  Service: Orthopedics;  Laterality: Right;   Family History  Problem Relation Age of Onset   Diabetes Mother    Hypertension Mother    Diabetes Father    Hypertension Father    Diabetes Sister    Dementia Brother    Cancer Neg Hx    Heart disease Neg Hx    Stroke Neg Hx    Colon cancer Neg Hx    Esophageal cancer Neg Hx    Rectal cancer Neg Hx    Stomach cancer Neg Hx    Social History   Socioeconomic History   Marital status: Single    Spouse name: Not on file    Number of children: 0   Years of education: 12   Highest education level: High school graduate  Occupational History   Occupation: Retired  Tobacco Use   Smoking status: Former    Packs/day: 0.75    Years: 30.00    Additional pack years: 0.00    Total pack years: 22.50    Types: Cigarettes    Quit date: 12/2020    Years since quitting: 1.6   Smokeless tobacco: Never  Vaping Use   Vaping Use: Never used  Substance and Sexual Activity   Alcohol use: Not Currently   Drug use: Not Currently   Sexual activity: Yes    Birth control/protection: Condom  Other Topics Concern   Not on file  Social History Narrative   Not on file   Social Determinants of Health   Financial Resource Strain: Low Risk  (08/02/2022)   Overall Financial Resource Strain (CARDIA)    Difficulty of Paying Living Expenses: Not hard at all  Food Insecurity: No Food Insecurity (08/02/2022)   Hunger Vital Sign    Worried About Running Out of Food in the Last Year: Never true    Ran Out of Food in the Last Year: Never true  Transportation Needs: No Transportation Needs (08/02/2022)   PRAPARE - Administrator, Civil Service (Medical): No    Lack of Transportation (Non-Medical): No  Physical Activity: Insufficiently Active (08/02/2022)   Exercise Vital Sign    Days of Exercise per Week: 7 days    Minutes of Exercise per Session: 20 min  Stress: No Stress Concern Present (08/02/2022)   [employer] of Occupational Health - Occupational Stress Questionnaire    Feeling of Stress : Not at all  Social Connections: Moderately Isolated (08/02/2022)   Social Connection and Isolation Panel [NHANES]    Frequency of Communication with Friends and Family: More than three times a week    Frequency of Social Gatherings with Friends and Family: More than three times a week    Attends Religious Services: More than 4 times per year    Active Member of [employer]  or Organizations: No    Attends Banker Meetings:  Never    Marital Status: Never married    Tobacco Counseling Counseling given: Not Answered   Clinical Intake:  Pre-visit preparation completed: Yes  Pain : 0-10 Pain Score: 5  Pain Type: Chronic pain Pain Location: Generalized Pain Descriptors / Indicators: Aching Pain Onset: More than a month ago Pain Frequency: Constant     Nutritional Risks: None Diabetes: Yes CBG done?: No Did pt. bring in CBG monitor from home?: No  How often do you need to have someone help you when you read instructions, pamphlets, or other written materials from your doctor or pharmacy?: 1 - Never  Diabetic?Nutrition Risk Assessment:  Has the patient had any N/V/D within the last 2 months?  No  Does the patient have any non-healing wounds?  No  Has the patient had any unintentional weight loss or weight gain?  No   Diabetes:  Is the patient diabetic?  Yes  If diabetic, was a CBG obtained today?  No  Did the patient bring in their glucometer from home?  No  How often do you monitor your CBG's? Once weekly.   Financial Strains and Diabetes Management:  Are you having any financial strains with the device, your supplies or your medication? No .  Does the patient want to be seen by Chronic Care Management for management of their diabetes?  No  Would the patient like to be referred to a Nutritionist or for Diabetic Management?  No   Diabetic Exams:  Diabetic Eye Exam: Completed 06/20/21 [facility] has appointment 08/22/22 Diabetic Foot Exam: Completed 10/31/21 PCP    Interpreter Needed?: No  Information entered by :: C.Feliza Diven LPN   Activities of Daily Living    08/02/2022    9:39 AM 07/29/2022    3:51 PM  In your present state of health, do you have any difficulty performing the following activities:  Hearing? 0 0  Vision? 1 0  Comment loss of vision in left eye   Difficulty concentrating or making decisions? 1 0  Comment occasionally   Walking or climbing stairs? 0 0  Dressing  or bathing? 0 0  Doing errands, shopping? 0 0  Preparing Food and eating ? N N  Using the Toilet? N N  In the past six months, have you accidently leaked urine? N N  Do you have problems with loss of bowel control? N N  Managing your Medications? N N  Managing your Finances? N N  Housekeeping or managing your Housekeeping? N N    Patient Care Team: Eden Emms, NP as PCP - General (Nurse Practitioner) Iran Ouch, MD as PCP - Cardiology (Cardiology) Delma Freeze, FNP as Nurse Practitioner (Cardiology) Iran Ouch, MD as Consulting Physician (Cardiology)  Indicate any recent Medical Services you may have received from other than [facility] providers in the past year (date may be approximate).     Assessment:   This is a routine wellness examination for Rashed.  Hearing/Vision screen Hearing Screening - Comments:: No aids Vision Screening - Comments:: Glasses - [facility]  Lois Huxley issues and exercise activities discussed: Current Exercise Habits: Home exercise routine, Type of exercise: walking, Time (Minutes): 20, Frequency (Times/Week): 7, Weekly Exercise (Minutes/Week): 140, Intensity: Mild, Exercise limited by: orthopedic condition(s) (knee pain)   Goals Addressed             This Visit's Progress    Patient Stated  No new goals       Depression Screen    08/02/2022    9:38 AM 05/11/2022    9:48 AM 10/25/2021   10:07 AM 07/31/2021   10:12 AM 07/25/2021    9:35 AM 07/10/2021    9:19 AM 04/27/2020    1:49 PM  PHQ 2/9 Scores  PHQ - 2 Score 0 0 1 0 0 0 0  PHQ- 9 Score  8         Fall Risk    08/02/2022    9:32 AM 07/29/2022    3:51 PM 05/11/2022    9:48 AM 10/25/2021   10:05 AM 07/31/2021   10:15 AM  Fall Risk   Falls in the past year? 0 0 0 0 0  Number falls in past yr: 0 0 0 0 0  Injury with Fall? 0 1 0 0 0  Risk for fall due to : No Fall Risks  No Fall Risks No Fall Risks No Fall Risks  Follow up Falls prevention discussed;Falls evaluation completed   Falls evaluation completed Falls evaluation completed     FALL RISK PREVENTION PERTAINING TO THE HOME:  Any stairs in or around the home? Yes  If so, are there any without handrails? Yes  Home free of loose throw rugs in walkways, pet beds, electrical cords, etc? Yes  Adequate lighting in your home to reduce risk of falls? Yes   ASSISTIVE DEVICES UTILIZED TO PREVENT FALLS:  Life alert? No  Use of a cane, walker or w/c? Yes  Grab bars in the bathroom? Yes  Shower chair or bench in shower? No  Elevated toilet seat or a handicapped toilet? Yes    Cognitive Function:        08/02/2022    9:43 AM 07/31/2021   10:17 AM  6CIT Screen  What Year? 0 points 0 points  What month? 0 points 0 points  What time? 0 points 0 points  Count back from 20 0 points 0 points  Months in reverse 2 points 0 points  Repeat phrase 4 points 0 points  Total Score 6 points 0 points    Immunizations Immunization History  Administered Date(s) Administered   Fluad Quad(high Dose 65+) 12/28/2021   HIB (PRP-T) 01/20/2021   Influenza,inj,Quad PF,6+ Mos 03/17/2013, 04/06/2014, 01/28/2015, 01/17/2016, 01/10/2017, 02/27/2018, 12/07/2019, 12/30/2020   Meningococcal B, OMV 07/10/2021, 10/31/2021   Meningococcal Mcv4o 01/20/2021   PFIZER(Purple Top)SARS-COV-2 Vaccination 06/13/2019, 07/04/2019   PNEUMOCOCCAL CONJUGATE-20 07/05/2021   Pfizer Covid-19 Vaccine Bivalent Booster 74yrs & up 05/14/2021, 01/15/2022   Pneumococcal Conjugate-13 01/20/2021   Pneumococcal Polysaccharide-23 03/17/2013, 07/30/2017   Tdap 11/11/2016    TDAP status: Up to date  Flu Vaccine status: Up to date  Pneumococcal vaccine status: Up to date  Covid-19 vaccine status: Information provided on how to obtain vaccines.   Qualifies for Shingles Vaccine? Yes   Zostavax completed No   Shingrix Completed?: No.    Education has been provided regarding the importance of this vaccine. Patient has been advised to call insurance company to  determine out of pocket expense if they have not yet received this vaccine. Advised may also receive vaccine at local pharmacy or Health Dept. Verbalized acceptance and understanding.  Screening Tests Health Maintenance  Topic Date Due   Meningococcal B Vaccine (1 of 4 - Increased Risk) 01/11/1966   Lung Cancer Screening  01/12/2022   COVID-19 Vaccine (5 - 2023-24 season) 03/12/2022   OPHTHALMOLOGY EXAM  06/21/2022   Zoster  Vaccines- Shingrix (1 of 2) 01/18/2025 (Originally 01/11/2006)   INFLUENZA VACCINE  10/25/2022   FOOT EXAM  11/01/2022   Diabetic kidney evaluation - Urine ACR  11/02/2022   HEMOGLOBIN A1C  11/09/2022   Diabetic kidney evaluation - eGFR measurement  07/27/2023   Medicare Annual Wellness (AWV)  08/02/2023   COLONOSCOPY (Pts 45-47yrs Insurance coverage will need to be confirmed)  08/28/2026   DTaP/Tdap/Td (2 - Td or Tdap) 11/12/2026   Pneumonia Vaccine 32+ Years old  Completed   Hepatitis C Screening  Completed   HPV VACCINES  Aged Out    Health Maintenance  Health Maintenance Due  Topic Date Due   Meningococcal B Vaccine (1 of 4 - Increased Risk) 01/11/1966   Lung Cancer Screening  01/12/2022   COVID-19 Vaccine (5 - 2023-24 season) 03/12/2022   OPHTHALMOLOGY EXAM  06/21/2022    Colorectal cancer screening: Type of screening: Colonoscopy. Completed 08/27/16. Repeat every 10 years  Lung Cancer Screening: (Low Dose CT Chest recommended if Age 59-80 years, 30 pack-year currently smoking OR have quit w/in 15years.) does qualify.   Lung Cancer Screening Referral: 01/12/2022  Additional Screening:  Hepatitis C Screening: does qualify; Completed 05/28/20  Vision Screening: Recommended annual ophthalmology exams for early detection of glaucoma and other disorders of the eye. Is the patient up to date with their annual eye exam?  Yes  Who is the provider or what is the name of the office in which the patient attends annual eye exams? [facility] If pt is not  established with a provider, would they like to be referred to a provider to establish care? No .   Dental Screening: Recommended annual dental exams for proper oral hygiene  Community Resource Referral / Chronic Care Management: CRR required this visit?  No   CCM required this visit?  No      Plan:     I have personally reviewed and noted the following in the patient's chart:   Medical and social history Use of alcohol, tobacco or illicit drugs  Current medications and supplements including opioid prescriptions. Patient is not currently taking opioid prescriptions. Functional ability and status Nutritional status Physical activity Advanced directives List of other physicians Hospitalizations, surgeries, and ER visits in previous 12 months Vitals Screenings to include cognitive, depression, and falls Referrals and appointments  In addition, I have reviewed and discussed with patient certain preventive protocols, quality metrics, and best practice recommendations. A written personalized care plan for preventive services as well as general preventive health recommendations were provided to patient.     Maryan Puls, LPN   03/31/1094   Nurse Notes: none

## 2022-08-02 NOTE — Telephone Encounter (Signed)
Called patient, no answer. Left message for patient to call back.

## 2022-08-03 ENCOUNTER — Encounter: Payer: Self-pay | Admitting: Cardiovascular Disease

## 2022-08-03 ENCOUNTER — Other Ambulatory Visit: Payer: Self-pay | Admitting: Nurse Practitioner

## 2022-08-03 ENCOUNTER — Ambulatory Visit: Payer: 59 | Attending: Cardiovascular Disease | Admitting: Cardiovascular Disease

## 2022-08-03 VITALS — BP 130/66 | HR 75 | Ht 76.0 in | Wt 200.0 lb

## 2022-08-03 DIAGNOSIS — I10 Essential (primary) hypertension: Secondary | ICD-10-CM

## 2022-08-03 DIAGNOSIS — I2510 Atherosclerotic heart disease of native coronary artery without angina pectoris: Secondary | ICD-10-CM

## 2022-08-03 DIAGNOSIS — I5022 Chronic systolic (congestive) heart failure: Secondary | ICD-10-CM

## 2022-08-03 DIAGNOSIS — Z720 Tobacco use: Secondary | ICD-10-CM | POA: Diagnosis not present

## 2022-08-03 DIAGNOSIS — E785 Hyperlipidemia, unspecified: Secondary | ICD-10-CM

## 2022-08-03 DIAGNOSIS — E1165 Type 2 diabetes mellitus with hyperglycemia: Secondary | ICD-10-CM

## 2022-08-03 NOTE — Progress Notes (Signed)
Cardiology Office Note   Date:  08/03/2022   ID:  Travis [name], DOB [birth year], MRN [id number]  PCP:  Travis Emms, NP  Cardiologist:   Travis Bears, MD   Chief Complaint  Patient presents with   Follow-up    4 month f/u no complaints today. Meds reviewed verbally with pt.      History of Present Illness: Travis [name] is a 67 y.o. 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 He has known history of mild nonobstructive one-vessel coronary artery disease, history of TIA, tobacco use, previous excessive alcohol use and hyperlipidemia. Echocardiogram in November 2016 showed worsening LV systolic dysfunction with an ejection fraction of 25-30%.  Cardiac catheterization showed mild nonobstructive one-vessel coronary artery disease. LVEDP was mildly elevated.   He was hospitalized in August, 2021 with transient vision loss and dizziness.  Stroke work-up was negative.  He was noted to be anemic with a hemoglobin of 9.  Negative occult blood.  He was also noted to have acute renal failure with a creatinine of 3.29.  Renal function improved with hydration.   He stopped drinking alcohol and quit smoking.  He has been following up at the heart failure clinic and was seen by Dr. Shirlee Latch recently.  He underwent a right and left cardiac catheterization last month.  I personally reviewed coronary angiography images.  There was a 60% stenosis in the proximal LAD that did not appear flow-limiting.  No other obstructive disease.  The patient is doing well overall with no chest pain, shortness of breath or palpitations.  Past Medical History:  Diagnosis Date   Alcohol abuse    Alcoholic cardiomyopathy (HCC) 08/65/7846   a. 05/2019 Echo: EF 35-40%; b. 10/2019 Echo: EF 45-50%.   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 12/2010 Echo: EF 20-25%; b 08/2013 Echo: EF 45-50%; c. 01/2015 Echo: EF 25-30%; d. 07/2016 Echo: EF 35-40%; e. 05/2019 Echo: EF 35-40%; d. 10/2019 Echo: EF 45-50%, mild LVH, mild red RV fxn, mildl dil RA, Triv MR. Mild to mod Ao Sclerosis w/o stenosis.   Hyperlipidemia    Hypertension    Nonischemic cardiomyopathy (HCC)    Nonobstructive Coronary artery disease    a. 01/2015 Cath: LM nl, LAD 30p/m, LCX nl, RCA 10p/m, RPDA min irregs.   Osteoarthritis    Pneumonia    TIA (transient ischemic attack)    Tobacco abuse    Vision loss    peripherial vision only left eye.Central Retinal  artery occusion     Past Surgical History:  Procedure Laterality Date   BIOPSY  11/25/2019   Procedure: BIOPSY;  Surgeon: Meryl Dare, MD;  Location: [facility] ENDOSCOPY;  Service: Endoscopy;;   CARDIAC CATHETERIZATION     CARDIAC CATHETERIZATION N/A 01/28/2015   Procedure: Left Heart Cath and Coronary Angiography;  Surgeon: Iran Ouch, MD;  Location: [facility] INVASIVE CV LAB;  Service: Cardiovascular;  Laterality: N/A;   COLONOSCOPY     ESOPHAGOGASTRODUODENOSCOPY (EGD) WITH  PROPOFOL N/A 11/25/2019   Procedure: ESOPHAGOGASTRODUODENOSCOPY (EGD) WITH PROPOFOL;  Surgeon: Meryl Dare, MD;  Location: [facility] ENDOSCOPY;  Service: Endoscopy;  Laterality: N/A;   HIP ARTHROPLASTY Right 03/15/2013   Procedure: ARTHROPLASTY BIPOLAR HIP;  Surgeon: Kathryne Hitch, MD;  Location: [facility] OR;  Service: Orthopedics;  Laterality: Right;   LAPAROSCOPIC APPENDECTOMY N/A 01/01/2021   Procedure: DIAGNOSTIC LAPAROSCOPY; EXPLORATORY LAPAROTOMY, ILEUCECECTOMY, DRAINAGE OF ABDOMINAL ABSCESS;  Surgeon: Violeta Gelinas, MD;  Location: [facility] OR;  Service: General;  Laterality: N/A;   RIGHT/LEFT HEART CATH AND CORONARY ANGIOGRAPHY N/A  07/17/2022   Procedure: RIGHT/LEFT HEART CATH AND CORONARY ANGIOGRAPHY;  Surgeon: Laurey Morale, MD;  Location: [facility] INVASIVE CV LAB;  Service: Cardiovascular;  Laterality: N/A;   SPLENECTOMY, TOTAL N/A 01/06/2021   Procedure: SPLENECTOMY;  Surgeon: Diamantina Monks, MD;  Location: [facility] OR;  Service: General;  Laterality: N/A;   TOTAL SHOULDER ARTHROPLASTY Right 09/01/2015   Procedure: RIGHT TOTAL SHOULDER ARTHROPLASTY;  Surgeon: Francena Hanly, MD;  Location: [facility] OR;  Service: Orthopedics;  Laterality: Right;     Current Outpatient Medications  Medication Sig Dispense Refill   acetaminophen (TYLENOL) 500 MG tablet Take 2 tablets (1,000 mg total) by mouth every 8 (eight) hours as needed. 30 tablet 0   albuterol (VENTOLIN HFA) 108 (90 Base) MCG/ACT inhaler Inhale 2 puffs into the lungs every 6 (six) hours as needed for wheezing or shortness of breath.     allopurinol (ZYLOPRIM) 100 MG tablet TAKE ONE TABLET BY MOUTH TWICE A DAY 180 tablet 1   aspirin (ASPIRIN CHILDRENS) 81 MG chewable tablet Chew 1 tablet (81 mg total) by mouth daily. (Patient taking differently: Chew 81 mg by mouth in the morning and at bedtime.) 36 tablet 11   atorvastatin (LIPITOR) 20 MG tablet Take 2 tablets (40 mg total) by mouth daily. 100 tablet 1   Blood Pressure KIT Check b/p once daily. Based on insurance preference 1 kit 0   carvedilol (COREG) 6.25 MG tablet TAKE 1 TABLET BY MOUTH TWICE A DAY 180 tablet 2   cetirizine (ZYRTEC) 10 MG tablet Take 1 tablet (10 mg total) by mouth daily. 90 tablet 3   diphenhydrAMINE (BENADRYL) 25 MG tablet Take 25 mg by mouth daily as needed for allergies.     fenofibrate (TRICOR) 145 MG tablet TAKE 1 TABLET BY MOUTH ONCE A DAY 90 tablet 1   ferrous sulfate 325 (65 FE) MG tablet Take 1 tablet (325 mg total) by mouth daily with breakfast. 90 tablet 2   fluticasone (FLONASE) 50 MCG/ACT nasal spray Place 1-2 sprays into both nostrils daily as needed for allergies or rhinitis.     glimepiride  (AMARYL) 1 MG tablet TAKE 1 TABLET BY MOUTH EVERY MORNING WITH BREAKFAST 90 tablet 0   hydroxypropyl methylcellulose / hypromellose (ISOPTO TEARS / GONIOVISC) 2.5 % ophthalmic solution Place 1 drop into both eyes 2 (two) times daily.     magnesium oxide (MAG-OX) 400 MG tablet Take 1 tablet (400 mg total) by mouth daily. 90 tablet 1   metFORMIN (GLUCOPHAGE) 500 MG tablet TAKE TWO TABLETS BY MOUTH TWICE A DAY WITH A MEAL 360 tablet 1   multivitamin (ONE-A-DAY MEN'S) TABS tablet Take 1 tablet by mouth daily with breakfast.     Omega-3 Fatty Acids (FISH OIL PO) Take 1 capsule by mouth daily.     pantoprazole (PROTONIX) 40 MG tablet TAKE ONE TABLET BY MOUTH ONCE A DAY 90 tablet 1   sacubitril-valsartan (ENTRESTO) 24-26 MG Take 1  tablet by mouth 2 (two) times daily. 180 tablet 3   spironolactone (ALDACTONE) 25 MG tablet Take 0.5 tablets (12.5 mg total) by mouth daily. 15 tablet 3   No current facility-administered medications for this visit.    Allergies:   Farxiga [dapagliflozin]    Social History:  The patient  reports that he quit smoking about 19 months ago. His smoking use included cigarettes. He has a 22.50 pack-year smoking history. He has never used smokeless tobacco. He reports that he does not currently use alcohol. He reports that he does not currently use drugs.   Family History:  The patient's family history includes Dementia in his brother; Diabetes in his father, mother, and sister; Hypertension in his father and mother.    ROS:  Please see the history of present illness.   Otherwise, review of systems are positive for none.   All other systems are reviewed and negative.    PHYSICAL EXAM: VS:  BP 130/66 (BP Location: Left Arm, Patient Position: Sitting, Cuff Size: Normal)   Pulse 75   Ht 6\' 4"  (1.93 m)   Wt 200 lb (90.7 kg)   SpO2 98%   BMI 24.34 kg/m  , BMI Body mass index is 24.34 kg/m. GEN: Well nourished, well developed, no acute distress. HEENT: normal  Neck: no JVD,  carotid bruits, or masses Cardiac: RRR; no murmurs, rubs, or gallops,no edema  Respiratory:  clear to auscultation bilaterally, normal work of breathing GI: soft, nontender, nondistended, + BS MS: no deformity or atrophy  Skin: warm and dry, no rash Neuro:  Strength and sensation are intact Psych: euthymic mood, full affect   EKG:  EKG is  ordered today. EKG showed normal sinus with incomplete left bundle branch block and left axis deviation.    Recent Labs: 02/08/2022: ALT 21 07/09/2022: B Natriuretic Peptide 54.7; Platelets 343 07/17/2022: Hemoglobin 14.3; Hemoglobin 14.3 07/27/2022: BUN 24; Creatinine, Ser 1.57; Potassium 4.1; Sodium 141    Lipid Panel    Component Value Date/Time   CHOL 141 02/08/2022 0952   CHOL 138 06/18/2017 1425   TRIG 141.0 02/08/2022 0952   HDL 33.30 (L) 02/08/2022 0952   HDL 67 06/18/2017 1425   CHOLHDL 4 02/08/2022 0952   VLDL 28.2 02/08/2022 0952   LDLCALC 80 02/08/2022 0952   LDLCALC 51 06/18/2017 1425   LDLDIRECT 78.0 03/03/2021 1301      Wt Readings from Last 3 Encounters:  08/03/22 200 lb (90.7 kg)  08/02/22 198 lb (89.8 kg)  07/30/22 198 lb 9.6 oz (90.1 kg)       ASSESSMENT AND PLAN: MN  1.   Chronic systolic heart failure: He is doing very well and appears to be euvolemic.  He is on optimal medical therapy and continues to follow with advanced heart failure.  No evidence of volume overload.    2.  Coronary artery disease: I personally reviewed the images of his cardiac catheterization done recently.  I agree that the LAD disease is moderate and does not appear to be obstructive at this time.  He currently has no anginal symptoms and I recommend continuing medical therapy.  3. Essential hypertension: Blood pressure is well controlled.  4. Tobacco use: I congratulated him on smoking cessation.  5.  Hyperlipidemia: Most recent lipid profile showed an LDL of 80.  I agree with increasing atorvastatin to 40 mg daily to try to get his  LDL below 70.   Disposition: Continue regular follow-up with advanced heart failure.  Follow-up  with [place] in 1 year.   Signed,  Travis Bears, MD  08/03/2022 10:25 AM    [facility]

## 2022-08-03 NOTE — Patient Instructions (Signed)
Medication Instructions:  No changes *If you need a refill on your cardiac medications before your next appointment, please call your pharmacy*   Lab Work: None ordered If you have labs (blood work) drawn today and your tests are completely normal, you will receive your results only by: MyChart Message (if you have MyChart) OR A paper copy in the mail If you have any lab test that is abnormal or we need to change your treatment, we will call you to review the results.   Testing/Procedures: None ordered   Follow-Up: At [facility], you and your health needs are our priority.  As part of our continuing mission to provide you with exceptional heart care, we have created designated Provider Care Teams.  These Care Teams include your primary Cardiologist (physician) and Advanced Practice Providers (APPs -  Physician Assistants and Nurse Practitioners) who all work together to provide you with the care you need, when you need it.  We recommend signing up for the patient portal called "MyChart".  Sign up information is provided on this After Visit Summary.  MyChart is used to connect with patients for Virtual Visits (Telemedicine).  Patients are able to view lab/test results, encounter notes, upcoming appointments, etc.  Non-urgent messages can be sent to your provider as well.   To learn more about what you can do with MyChart, go to [email or web address].    Your next appointment:   12 month(s)  Provider:   You may see Muhammad Arida, MD or one of the following Advanced Practice Providers on your designated Care Team:   Christopher Berge, NP Ryan Dunn, PA-C Cadence Furth, PA-C Sheri Hammock, NP    

## 2022-08-08 ENCOUNTER — Other Ambulatory Visit: Payer: Self-pay | Admitting: Nurse Practitioner

## 2022-08-08 DIAGNOSIS — E1165 Type 2 diabetes mellitus with hyperglycemia: Secondary | ICD-10-CM

## 2022-08-10 ENCOUNTER — Other Ambulatory Visit: Payer: Self-pay | Admitting: Physician Assistant

## 2022-08-16 ENCOUNTER — Telehealth: Payer: Self-pay | Admitting: Nurse Practitioner

## 2022-08-16 ENCOUNTER — Encounter: Payer: Self-pay | Admitting: Nurse Practitioner

## 2022-08-16 ENCOUNTER — Ambulatory Visit (INDEPENDENT_AMBULATORY_CARE_PROVIDER_SITE_OTHER): Payer: 59 | Admitting: Nurse Practitioner

## 2022-08-16 VITALS — BP 126/66 | HR 78 | Temp 98.4°F | Resp 16 | Ht 73.5 in | Wt 193.5 lb

## 2022-08-16 DIAGNOSIS — Z87891 Personal history of nicotine dependence: Secondary | ICD-10-CM | POA: Diagnosis not present

## 2022-08-16 DIAGNOSIS — I5022 Chronic systolic (congestive) heart failure: Secondary | ICD-10-CM

## 2022-08-16 DIAGNOSIS — I10 Essential (primary) hypertension: Secondary | ICD-10-CM | POA: Diagnosis not present

## 2022-08-16 DIAGNOSIS — M545 Low back pain, unspecified: Secondary | ICD-10-CM | POA: Diagnosis not present

## 2022-08-16 DIAGNOSIS — Z0001 Encounter for general adult medical examination with abnormal findings: Secondary | ICD-10-CM

## 2022-08-16 DIAGNOSIS — Z8739 Personal history of other diseases of the musculoskeletal system and connective tissue: Secondary | ICD-10-CM | POA: Diagnosis not present

## 2022-08-16 DIAGNOSIS — R21 Rash and other nonspecific skin eruption: Secondary | ICD-10-CM | POA: Insufficient documentation

## 2022-08-16 DIAGNOSIS — N183 Chronic kidney disease, stage 3 unspecified: Secondary | ICD-10-CM

## 2022-08-16 DIAGNOSIS — Z7984 Long term (current) use of oral hypoglycemic drugs: Secondary | ICD-10-CM

## 2022-08-16 DIAGNOSIS — G8929 Other chronic pain: Secondary | ICD-10-CM | POA: Diagnosis not present

## 2022-08-16 DIAGNOSIS — Z0000 Encounter for general adult medical examination without abnormal findings: Secondary | ICD-10-CM | POA: Insufficient documentation

## 2022-08-16 DIAGNOSIS — E119 Type 2 diabetes mellitus without complications: Secondary | ICD-10-CM

## 2022-08-16 DIAGNOSIS — Z122 Encounter for screening for malignant neoplasm of respiratory organs: Secondary | ICD-10-CM

## 2022-08-16 DIAGNOSIS — Z125 Encounter for screening for malignant neoplasm of prostate: Secondary | ICD-10-CM

## 2022-08-16 LAB — LIPID PANEL
Cholesterol: 141 mg/dL (ref 0–200)
HDL: 34.5 mg/dL — ABNORMAL LOW (ref >39.00)
LDL Cholesterol: 80 mg/dL (ref 0–99)
NonHDL: 106.6
Total CHOL/HDL Ratio: 4
Triglycerides: 134 mg/dL (ref 0.0–149.0)
VLDL: 26.8 mg/dL (ref 0.0–40.0)

## 2022-08-16 LAB — COMPREHENSIVE METABOLIC PANEL
ALT: 24 U/L (ref 0–53)
AST: 23 U/L (ref 0–37)
Albumin: 4.4 g/dL (ref 3.5–5.2)
Alkaline Phosphatase: 70 U/L (ref 39–117)
BUN: 25 mg/dL — ABNORMAL HIGH (ref 6–23)
CO2: 24 mEq/L (ref 19–32)
Calcium: 10 mg/dL (ref 8.4–10.5)
Chloride: 104 mEq/L (ref 96–112)
Creatinine, Ser: 1.55 mg/dL — ABNORMAL HIGH (ref 0.40–1.50)
GFR: 46.33 mL/min — ABNORMAL LOW (ref >60.00)
Glucose, Bld: 102 mg/dL — ABNORMAL HIGH (ref 70–99)
Potassium: 4.8 mEq/L (ref 3.5–5.1)
Sodium: 138 mEq/L (ref 135–145)
Total Bilirubin: 0.6 mg/dL (ref 0.2–1.2)
Total Protein: 6.9 g/dL (ref 6.0–8.3)

## 2022-08-16 LAB — POCT GLYCOSYLATED HEMOGLOBIN (HGB A1C): Hemoglobin A1C: 6.7 % — AB (ref 4.0–5.6)

## 2022-08-16 LAB — CBC
HCT: 41.6 % (ref 39.0–52.0)
Hemoglobin: 13.5 g/dL (ref 13.0–17.0)
MCHC: 32.4 g/dL (ref 30.0–36.0)
MCV: 96.7 fl (ref 78.0–100.0)
Platelets: 363 10*3/uL (ref 150.0–400.0)
RBC: 4.31 Mil/uL (ref 4.22–5.81)
RDW: 16.1 % — ABNORMAL HIGH (ref 11.5–15.5)
WBC: 7.1 10*3/uL (ref 4.0–10.5)

## 2022-08-16 LAB — URIC ACID: Uric Acid, Serum: 5.3 mg/dL (ref 4.0–7.8)

## 2022-08-16 LAB — URINALYSIS, MICROSCOPIC ONLY

## 2022-08-16 LAB — PSA, MEDICARE: PSA: 0.83 ng/ml (ref 0.10–4.00)

## 2022-08-16 LAB — MICROALBUMIN / CREATININE URINE RATIO
Creatinine,U: 129.7 mg/dL
Microalb Creat Ratio: 0.6 mg/g (ref 0.0–30.0)
Microalb, Ur: 0.7 mg/dL (ref 0.0–1.9)

## 2022-08-16 LAB — TSH: TSH: 0.87 u[IU]/mL (ref 0.35–5.50)

## 2022-08-16 MED ORDER — TRAMADOL HCL 50 MG PO TABS
50.0000 mg | ORAL_TABLET | Freq: Three times a day (TID) | ORAL | 0 refills | Status: AC | PRN
Start: 2022-08-16 — End: 2022-08-21

## 2022-08-16 MED ORDER — TRIAMCINOLONE ACETONIDE 0.5 % EX CREA
1.0000 | TOPICAL_CREAM | Freq: Two times a day (BID) | CUTANEOUS | 0 refills | Status: DC
Start: 2022-08-16 — End: 2023-03-26

## 2022-08-16 NOTE — Assessment & Plan Note (Signed)
Patient currently maintained on allopurinol.  Pending uric acid level today continue medication as prescribed

## 2022-08-16 NOTE — Telephone Encounter (Signed)
Patient has been made aware to take 12.5 mg once daily as prescribed by Dr. Shirlee Latch.

## 2022-08-16 NOTE — Assessment & Plan Note (Signed)
Patient currently maintained on metformin and glimepiride.  A1c of 6.7% today.  Continue medications without change.  Urine microalbumin today.  Detailed foot exam done today 2.

## 2022-08-16 NOTE — Telephone Encounter (Signed)
Yes, he should be on spironolactone 12.5 mg once daily and not twice daily.

## 2022-08-16 NOTE — Assessment & Plan Note (Signed)
Former tobacco user.  Pending urine microscopy to rule out microscopic hematuria.

## 2022-08-16 NOTE — Assessment & Plan Note (Signed)
Patient currently maintained on Entresto, spironolactone, carvedilol.  Blood pressure within normal limits.  He is followed by cardiology continue taking medication as prescribed follow with cardiology as recommended.

## 2022-08-16 NOTE — Telephone Encounter (Signed)
Dr. Kirke Corin,  I saw Travis [name] today and he mentions he is taking 0.5 tabs of spironolactone twice daily.  Informed him it was ordered once a day.  Told him I reach out to your office for clarification if your staff could reach out to the patient and clarify please  Thanks, [facility]

## 2022-08-16 NOTE — Assessment & Plan Note (Signed)
Discussed age-appropriate immunizations and screening exams.  Reviewed patient's personal, surgical, social, family histories.  Patient is up-to-date on all vaccinations except for shingles vaccine.  He will get that at his local pharmacy.  Patient is up-to-date on CRC screening will do PSA for prostate cancer screening today.  Patient was given information at discharge about preventative healthcare maintenance with anticipatory guidance.

## 2022-08-16 NOTE — Patient Instructions (Signed)
Nice to see you today Consider getting the shingles vaccine at the pharmacy Follow up with me in 4 months for a sugar recheck, sooner if you need me   I have also sent in the pain medication and cream for the rash

## 2022-08-16 NOTE — Progress Notes (Signed)
Established Patient Office Visit  Subjective   Patient ID: Travis [name], male    DOB: [birth year]  Age: 67 y.o. MRN: [id number]  Chief Complaint  Patient presents with   Annual Exam    HPI  Dm2: currently maintained on metformin and glimepiride. Last A1C was 7.1%. 1000mg  metformin and 500mg  in the evening. States that he checks it infrequently.  States that he had a high sugar appox 3 weeks ago. States that it was in 100   CHF: followed by cardiology.  Patient currently on spironolactone, Entresto.  Patient states he is taking half a tablet of spironolactone twice a day versus once a day reach out to cardiology to see which way he needs to take it.  COPD: has not had to use the albuterol inhaler   HLD: Patient currently maintained on atorvastatin 20 mg and fenofibrate 145 daily.  He is followed by cardiology  for complete physical and follow up of chronic conditions.  Immunizations: -Tetanus: Completed in 2018 -Influenza: Completed this season -Shingles: get at local pharmacy -Pneumonia: Completed   Diet: Fair diet. States that he is doing breakfast and do his morning pills. Snack at lunch and good dinner. States later he will have a snakc. States that he is doing lemonade and tea with equal  Exercise:  1 mile a day approx 20 mins   Eye exam: Completes annually. Wears glasses. has an appointment next  week Dental exam: has plates.  States they do not feel since his hospitalization or use also much weight.  States that he knows his insurance should pay for new.  He just has not gotten that done yet.  Colonoscopy: Completed in 2018 repeat in 10 years Lung Cancer Screening: amb refer placed  PSA: Due  Rash: states that it has been three days. Calamine lotoin that he has been using. States that it has helped some. States that it will itch at night if he gets warm       Review of Systems  Constitutional:  Negative for chills and fever.  Respiratory:  Negative for  shortness of breath.   Cardiovascular:  Negative for chest pain and leg swelling.  Gastrointestinal:  Negative for abdominal pain, blood in stool, constipation, diarrhea, nausea and vomiting.       BM daily   Genitourinary:  Negative for dysuria and hematuria.  Skin:  Positive for itching and rash.  Neurological:  Negative for tingling and headaches.  Psychiatric/Behavioral:  Negative for hallucinations and suicidal ideas.       Objective:     BP 126/66   Pulse 78   Temp 98.4 F (36.9 C)   Resp 16   Ht 6' 1.5" (1.867 m)   Wt 193 lb 8 oz (87.8 kg)   SpO2 98%   BMI 25.18 kg/m    Physical Exam Vitals and nursing note reviewed.  Constitutional:      Appearance: Normal appearance.  HENT:     Right Ear: Tympanic membrane, ear canal and external ear normal.     Left Ear: Tympanic membrane, ear canal and external ear normal.     Mouth/Throat:     Mouth: Mucous membranes are moist.     Pharynx: Oropharynx is clear.  Eyes:     Extraocular Movements: Extraocular movements intact.     Pupils: Pupils are equal, round, and reactive to light.  Cardiovascular:     Rate and Rhythm: Normal rate and regular rhythm.     Pulses: Normal pulses.  Heart sounds: Normal heart sounds.  Pulmonary:     Effort: Pulmonary effort is normal.     Breath sounds: Normal breath sounds.  Abdominal:     General: Bowel sounds are normal. There is no distension.     Palpations: There is no mass.     Tenderness: There is no abdominal tenderness.     Hernia: No hernia is present.     Comments: Large midline scar  Musculoskeletal:     Right lower leg: No edema.     Left lower leg: No edema.  Lymphadenopathy:     Cervical: No cervical adenopathy.  Skin:    General: Skin is warm.     Findings: Rash present.          Comments: Papular scattered rash no erythema edema or discharge noted.  Neurological:     General: No focal deficit present.     Mental Status: He is alert.     Deep Tendon  Reflexes:     Reflex Scores:      Bicep reflexes are 2+ on the right side and 2+ on the left side.      Patellar reflexes are 2+ on the right side and 2+ on the left side.    Comments: Bilateral upper and lower extremity strength 5/5  Psychiatric:        Mood and Affect: Mood normal.        Behavior: Behavior normal.        Thought Content: Thought content normal.        Judgment: Judgment normal.    Diabetic Foot Form - Detailed   Diabetic Foot Exam - detailed Diabetic Foot exam was performed with the following findings: Yes 08/16/2022  9:24 AM  Is there swelling or and abnormal foot shape?: No Is there a claw toe deformity?: No Is there elevated skin temparature?: No Pulse Foot Exam completed.: Yes   Right posterior Tibialias: Present Left posterior Tibialias: Present   Right Dorsalis Pedis: Present Left Dorsalis Pedis: Present  Sensory Foot Exam Completed.: Yes Semmes-Weinstein Monofilament Test   Comments: All 10 sites test bilaterally sensation intact       Results for orders placed or performed in visit on 08/16/22  POCT glycosylated hemoglobin (Hb A1C)  Result Value Ref Range   Hemoglobin A1C 6.7 (A) 4.0 - 5.6 %   HbA1c POC (<> result, manual entry)     HbA1c, POC (prediabetic range)     HbA1c, POC (controlled diabetic range)        The 10-year ASCVD risk score (Arnett DK, et al., 2019) is: 44.8%    Assessment & Plan:   Problem List Items Addressed This Visit       Cardiovascular and Mediastinum   Chronic systolic heart failure (HCC)    Patient currently on spironolactone, carvedilol, Entresto.  He is followed by cardiology continue taking medication as prescribed follow-up with cardiology as recommended.      Relevant Orders   CBC   Comprehensive metabolic panel   Essential hypertension    Patient currently maintained on Entresto, spironolactone, carvedilol.  Blood pressure within normal limits.  He is followed by cardiology continue taking medication  as prescribed follow with cardiology as recommended.      Relevant Orders   CBC   Comprehensive metabolic panel     Endocrine   Diabetes (HCC) - Primary    Patient currently maintained on metformin and glimepiride.  A1c of 6.7% today.  Continue medications without change.  Urine microalbumin today.  Detailed foot exam done today 2.      Relevant Orders   POCT glycosylated hemoglobin (Hb A1C) (Completed)   CBC   Comprehensive metabolic panel   Microalbumin / creatinine urine ratio   Lipid panel     Musculoskeletal and Integument   Rash    Likely a plant dermatitis.  Triamcinolone 0.5% cream twice daily for 7 days.  Steroid precautions reviewed with patient.      Relevant Medications   triamcinolone cream (KENALOG) 0.5 %     Genitourinary   CKD (chronic kidney disease), stage III (HCC)    History of the same pending labs today.      Relevant Orders   CBC   Comprehensive metabolic panel     Other   Former tobacco use    Former tobacco user.  Pending urine microscopy to rule out microscopic hematuria.      Relevant Orders   Urine Microscopic   Chronic bilateral low back pain without sciatica    History of the same refill for short course of tramadol provided today.      Relevant Medications   traMADol (ULTRAM) 50 MG tablet   History of gout    Patient currently maintained on allopurinol.  Pending uric acid level today continue medication as prescribed      Relevant Orders   Uric acid   Encounter for general adult medical examination with abnormal findings    Discussed age-appropriate immunizations and screening exams.  Reviewed patient's personal, surgical, social, family histories.  Patient is up-to-date on all vaccinations except for shingles vaccine.  He will get that at his local pharmacy.  Patient is up-to-date on CRC screening will do PSA for prostate cancer screening today.  Patient was given information at discharge about preventative healthcare maintenance  with anticipatory guidance.      Relevant Orders   CBC   Comprehensive metabolic panel   TSH   Other Visit Diagnoses     Screening for prostate cancer       Relevant Orders   PSA, Medicare   Screening for lung cancer       Relevant Orders   Ambulatory Referral Lung Cancer Screening [facility]   Long term current use of oral hypoglycemic drug           Return in about 4 months (around 12/17/2022) for DM recheck.    Audria Nine, NP

## 2022-08-16 NOTE — Assessment & Plan Note (Signed)
Patient currently on spironolactone, carvedilol, Entresto.  He is followed by cardiology continue taking medication as prescribed follow-up with cardiology as recommended.

## 2022-08-16 NOTE — Assessment & Plan Note (Signed)
History of the same pending labs today.

## 2022-08-16 NOTE — Assessment & Plan Note (Signed)
History of the same refill for short course of tramadol provided today.

## 2022-08-16 NOTE — Assessment & Plan Note (Signed)
Likely a plant dermatitis.  Triamcinolone 0.5% cream twice daily for 7 days.  Steroid precautions reviewed with patient.

## 2022-08-17 NOTE — Telephone Encounter (Signed)
Form completed.

## 2022-09-19 ENCOUNTER — Ambulatory Visit (HOSPITAL_BASED_OUTPATIENT_CLINIC_OR_DEPARTMENT_OTHER): Payer: 59 | Admitting: Cardiology

## 2022-09-19 ENCOUNTER — Other Ambulatory Visit
Admission: RE | Admit: 2022-09-19 | Discharge: 2022-09-19 | Disposition: A | Discharge status: Home / Self Care | Payer: 59 | Source: Ambulatory Visit | Attending: Cardiology | Admitting: Cardiology

## 2022-09-19 VITALS — BP 133/83 | HR 80 | Ht 76.0 in | Wt 194.0 lb

## 2022-09-19 DIAGNOSIS — I5022 Chronic systolic (congestive) heart failure: Secondary | ICD-10-CM | POA: Insufficient documentation

## 2022-09-19 LAB — BASIC METABOLIC PANEL
Anion gap: 9 (ref 5–15)
BUN: 24 mg/dL — ABNORMAL HIGH (ref 8–23)
CO2: 21 mmol/L — ABNORMAL LOW (ref 22–32)
Calcium: 9.5 mg/dL (ref 8.9–10.3)
Chloride: 107 mmol/L (ref 98–111)
Creatinine, Ser: 1.4 mg/dL — ABNORMAL HIGH (ref 0.61–1.24)
GFR, Estimated: 55 mL/min — ABNORMAL LOW (ref >60)
Glucose, Bld: 108 mg/dL — ABNORMAL HIGH (ref 70–99)
Potassium: 4.5 mmol/L (ref 3.5–5.1)
Sodium: 137 mmol/L (ref 135–145)

## 2022-09-19 LAB — BRAIN NATRIURETIC PEPTIDE: B Natriuretic Peptide: 26.2 pg/mL (ref 0.0–100.0)

## 2022-09-19 MED ORDER — SPIRONOLACTONE 25 MG PO TABS: 25.0000 mg | ORAL_TABLET | Freq: Every day | ORAL | 3 refills | Status: DC

## 2022-09-19 MED ORDER — ATORVASTATIN CALCIUM 80 MG PO TABS: 80.0000 mg | ORAL_TABLET | Freq: Every day | ORAL | 3 refills | Status: DC

## 2022-09-19 NOTE — Patient Instructions (Signed)
Medication Changes:  Please increase Spironolactone to 25 mg  ( 1 tablet) every evening.   Increase atorvastatin to 80 mg daily.  Lab Work:  Labs done today, your results will be available in MyChart, we will contact you for abnormal readings.   Special Instructions // Education:  Do the following things EVERYDAY: Weigh yourself in the morning before breakfast. Write it down and keep it in a log. Take your medicines as prescribed Eat low salt foods--Limit salt (sodium) to 2000 mg per day.  Stay as active as you can everyday Limit all fluids for the day to less than 2 liters   Please follow a low potassium diet.   Follow-Up in:  please follow up in 6 weeks.     If you have any questions or concerns before your next appointment please send [place] a message through [place] or call our office at [phone] Monday-Friday 8 am-5 pm.   If you have an urgent need after hours on the weekend please call your Primary Cardiologist or the Advanced Heart Failure Clinic in [place] at [phone].

## 2022-09-19 NOTE — Progress Notes (Signed)
PCP: Eden Emms, NP HF Cardiology: Dr. Shirlee Latch  67 y.o. with history of CHF, HTN, CKD stage 3 who was referred to HF MD by Clarisa Kindred.  Patient has a long history of cardiomyopathy.  In 11/16, echo was done showing EF 25-30%.  Cath at that time showed nonobstructive CAD.  Over the years, his LV function improved, with echoes in 2021 and 2022 showing EF 45-50%. He was a prior heavy drinker but quit several years ago. He is also a prior smoker. ETOH was thought to contribute to his cardiomyopathy. He had a long admission in 10/22 with ruptured appendicitis and sepsis.  This admission was also complicated by splenic rupture and splenectomy.  GDMT in recent years appears to have been limited by low BP and renal dysfunction.    In 1/24, he had an echo done that showed EF 25-30% with mild LV dilation and mild LVH. This was a signficant fall from previous years.  He did not report any significant changes around this time. LHC/RHC in 4/24 showed 60% pLAD stenosis, 40% D1; normal filling pressures and CI 2.61.   Patient currently does not smoke cigarettes or drink, he does use marijuana occasionally.  No other drugs.    He returns for followup of CHF.  Weight down 4 lbs.  He continues to do well symptomatically.  Rare lightheadedness with fast standing.  No exertional dyspnea.  No orthopnea/PND.  No chest pain.    ECG (personally reviewed): NSR, LVH with repolarization abnormality.   Labs (11/13): LDL 80 Labs (2/24): K 4.3, creatinine 1.42 Labs (5/24): K 4.1 => 4.8, creatinine 1.57 => 1.55, LDL 80  PMH: 1. Hyperlipidemia 2. Gout 3. HTN 4. GI bleeding 5. Type 2 diabetes 6. H/o ETOH abuse: Quit drinking in 2022.  7. Central retinal artery occlusion: Left eye, 2015.  8. CKD stage 3 9. Appendicitis: 10/22 hospitalization with ruptured appendix, also complicated by splenic rupture needing splenectomy.  10. H/o LUE DVT: Thought to be related to PICC.  11. H/o TIA 12. Chronic systolic CHF: Echo in  11/16 with EF 25-30%, cath in 2016 and 4/24with nonobstructive CAD.  - Echo (5/18): EF 35-40%.  - Echo (3/21): EF 35-40% - Echo (8/21): EF 45-50% - Echo (10/22): EF 45-50%, mild MR - Echo (3/24): EF 25-30%, mild LV dilation, mild LVH, normal RV, mild MR.  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 CAD: Cath (4/24) with 60% pLAD, 40% D1.   Social History   Socioeconomic History   Marital status: Single    Spouse name: Not on file   Number of children: 0   Years of education: 12   Highest education level: High school graduate  Occupational History   Occupation: Retired  Tobacco Use   Smoking status: Former    Packs/day: 0.75    Years: 30.00    Additional pack years: 0.00    Total pack years: 22.50    Types: Cigarettes    Quit date: 12/2020    Years since quitting: 1.7   Smokeless tobacco: Never  Vaping Use   Vaping Use: Never used  Substance and Sexual Activity   Alcohol use: Not Currently   Drug use: Not Currently   Sexual activity: Yes    Birth control/protection: Condom  Other Topics Concern   Not on file  Social History Narrative   Not on file   Social Determinants of Health  Financial Resource Strain: Low Risk  (08/02/2022)   Overall Financial Resource Strain (CARDIA)    Difficulty of Paying Living Expenses: Not hard at all  Food Insecurity: No Food Insecurity (08/02/2022)   Hunger Vital Sign    Worried About Running Out of Food in the Last Year: Never true    Ran Out of Food in the Last Year: Never true  Transportation Needs: No Transportation Needs (08/02/2022)   PRAPARE - Administrator, Civil Service (Medical): No    Lack of Transportation (Non-Medical): No  Physical Activity: Insufficiently Active (08/02/2022)   Exercise Vital Sign    Days of Exercise per Week: 7 days    Minutes of Exercise per Session: 20 min  Stress: No Stress Concern Present (08/02/2022)   [employer] of  Occupational Health - Occupational Stress Questionnaire    Feeling of Stress : Not at all  Social Connections: Moderately Isolated (08/02/2022)   Social Connection and Isolation Panel [NHANES]    Frequency of Communication with Friends and Family: More than three times a week    Frequency of Social Gatherings with Friends and Family: More than three times a week    Attends Religious Services: More than 4 times per year    Active Member of [employer] or Organizations: No    Attends Banker Meetings: Never    Marital Status: Never married  Intimate Partner Violence: Not At Risk (08/02/2022)   Humiliation, Afraid, Rape, and Kick questionnaire    Fear of Current or Ex-Partner: No    Emotionally Abused: No    Physically Abused: No    Sexually Abused: No   Family History  Problem Relation Age of Onset   Diabetes Mother    Hypertension Mother    Diabetes Father    Hypertension Father    Diabetes Sister    Dementia Brother    Cancer Neg Hx    Heart disease Neg Hx    Stroke Neg Hx    Colon cancer Neg Hx    Esophageal cancer Neg Hx    Rectal cancer Neg Hx    Stomach cancer Neg Hx    ROS: All systems reviewed and negative except as per HPI.   Current Outpatient Medications on File Prior to Visit  Medication Sig Dispense Refill   acetaminophen (TYLENOL) 500 MG tablet Take 2 tablets (1,000 mg total) by mouth every 8 (eight) hours as needed. 30 tablet 0   allopurinol (ZYLOPRIM) 100 MG tablet TAKE ONE TABLET BY MOUTH TWICE A DAY 180 tablet 1   aspirin (ASPIRIN CHILDRENS) 81 MG chewable tablet Chew 1 tablet (81 mg total) by mouth daily. (Patient taking differently: Chew 81 mg by mouth in the morning and at bedtime.) 36 tablet 11   Blood Pressure KIT Check b/p once daily. Based on insurance preference 1 kit 0   carvedilol (COREG) 6.25 MG tablet TAKE ONE TABLET BY MOUTH TWICE A DAY 180 tablet 3   cetirizine (ZYRTEC) 10 MG tablet Take 1 tablet (10 mg total) by mouth daily. 90 tablet 3    diphenhydrAMINE (BENADRYL) 25 MG tablet Take 25 mg by mouth daily as needed for allergies.     fenofibrate (TRICOR) 145 MG tablet TAKE 1 TABLET BY MOUTH ONCE A DAY 90 tablet 1   ferrous sulfate 325 (65 FE) MG tablet Take 1 tablet (325 mg total) by mouth daily with breakfast. 90 tablet 2   fluticasone (FLONASE) 50 MCG/ACT nasal spray Place 1-2 sprays into both  nostrils daily as needed for allergies or rhinitis.     glimepiride (AMARYL) 1 MG tablet TAKE ONE TABLET BY MOUTH EVERY MORNING WITH BREAKFAST 90 tablet 0   hydroxypropyl methylcellulose / hypromellose (ISOPTO TEARS / GONIOVISC) 2.5 % ophthalmic solution Place 1 drop into both eyes 2 (two) times daily.     magnesium oxide (MAG-OX) 400 MG tablet Take 1 tablet (400 mg total) by mouth daily. 90 tablet 1   metFORMIN (GLUCOPHAGE) 500 MG tablet TAKE TWO TABLETS BY MOUTH TWICE A DAY WITH A MEAL 360 tablet 1   multivitamin (ONE-A-DAY MEN'S) TABS tablet Take 1 tablet by mouth daily with breakfast.     Omega-3 Fatty Acids (FISH OIL PO) Take 1 capsule by mouth daily.     pantoprazole (PROTONIX) 40 MG tablet TAKE ONE TABLET BY MOUTH ONCE A DAY 90 tablet 1   sacubitril-valsartan (ENTRESTO) 24-26 MG Take 1 tablet by mouth 2 (two) times daily. 180 tablet 3   triamcinolone cream (KENALOG) 0.5 % Apply 1 Application topically 2 (two) times daily. For 7 days 30 g 0   albuterol (VENTOLIN HFA) 108 (90 Base) MCG/ACT inhaler Inhale 2 puffs into the lungs every 6 (six) hours as needed for wheezing or shortness of breath. (Patient not taking: Reported on 09/19/2022)     No current facility-administered medications on file prior to visit.   BP 133/83   Pulse 80   Ht 6\' 4"  (1.93 m)   Wt 194 lb (88 kg)   SpO2 97%   BMI 23.61 kg/m  General: NAD Neck: No JVD, no thyromegaly or thyroid nodule.  Lungs: Clear to auscultation bilaterally with normal respiratory effort. CV: Nondisplaced PMI.  Heart regular S1/S2, no S3/S4, no murmur.  No peripheral edema.  No carotid  bruit.  Normal pedal pulses.  Abdomen: Soft, nontender, no hepatosplenomegaly, no distention.  Skin: Intact without lesions or rashes.  Neurologic: Alert and oriented x 3.  Psych: Normal affect. Extremities: No clubbing or cyanosis.  HEENT: Normal.   Assessment/Plan: 1. Chronic systolic CHF: Long-standing, thought to be nonischemic cardiomyopathy possibly due to prior ETOH.  Initial echo in 11/16 showed EF 25-30%, cath in 2016 with nonobstructive CAD.  By 2021 and 2022 echoes, EF had improved to 45-50%.  Echo done in 1/24 showed significant drop in EF to 25-30% with normal RV, mild MR.  He no longer drinks ETOH and has not smoked for several years.  Repeat cath in 4/24 showed nonobstructive CAD.  Not volume overloaded on exam.  NYHA class I-II.  - Continue Coreg 6.25 mg bid.  - Off Farxiga due to "dehydration," says he felt terrible on it and does not want to retry.   - Increase spironolactone to 25 mg daily and follow low K diet with recent high normal K.  Will check BMET/BNP today and BMET in 10 days.  - Continue Entresto 24/26 bid.  - Will need slow and careful GDMT titration with history of dehydration and orthostasis.  - Cardiac MRI to look for infiltrative disease/myocarditis, scheduled for 7/24. If EF does not improve, will be candidate for ICD.  Narrow QRS so not candidate for CRT.   2. CKD stage 3: BMET today.  3. H/o DVT: Related to PICC line, no longer on anticoagulation.  4. CAD: Cath 4/24 with 60% pLAD, 40% D1.   - Continue ASA 81 - Goal LDL < 55, increase atorvastatin to 80 mg daily with lipids/LFTs in 2 months.   Followup 6 weeks.   Vlasta Baskin  Shirlee Latch 09/19/2022

## 2022-10-10 ENCOUNTER — Telehealth (HOSPITAL_COMMUNITY): Payer: Self-pay | Admitting: *Deleted

## 2022-10-10 NOTE — Telephone Encounter (Signed)
Reaching out to patient to offer assistance regarding upcoming cardiac imaging study; pt verbalizes understanding of appt date/time, parking situation and where to check in, and verified current allergies; name and call back number provided for further questions should they arise  Merle Prescott RN Navigator Cardiac Imaging [facility] [phone] office [phone] cell  Patient reports having an MRI in the past without incident. 

## 2022-10-11 ENCOUNTER — Ambulatory Visit (HOSPITAL_COMMUNITY)
Admission: RE | Admit: 2022-10-11 | Discharge: 2022-10-11 | Disposition: A | Discharge status: Home / Self Care | Payer: 59 | Source: Ambulatory Visit | Attending: Cardiology | Admitting: Cardiology

## 2022-10-11 ENCOUNTER — Other Ambulatory Visit (HOSPITAL_COMMUNITY): Payer: Self-pay | Admitting: Cardiology

## 2022-10-11 DIAGNOSIS — I5022 Chronic systolic (congestive) heart failure: Secondary | ICD-10-CM

## 2022-10-11 DIAGNOSIS — I428 Other cardiomyopathies: Secondary | ICD-10-CM

## 2022-10-11 MED ORDER — GADOBUTROL 1 MMOL/ML IV SOLN
10.0000 mL | Freq: Once | INTRAVENOUS | Status: AC | PRN
  Administered 2022-10-11: 10 mL via INTRAVENOUS

## 2022-11-07 ENCOUNTER — Other Ambulatory Visit: Payer: Self-pay | Admitting: Nurse Practitioner

## 2022-11-07 DIAGNOSIS — E1165 Type 2 diabetes mellitus with hyperglycemia: Secondary | ICD-10-CM

## 2022-11-07 NOTE — Telephone Encounter (Signed)
Called patient to schedule, patient said he will call back around beginning of September to schedule appointment

## 2022-11-07 NOTE — Telephone Encounter (Signed)
 Can we get the patient scheduled per my last office note please

## 2022-11-12 ENCOUNTER — Ambulatory Visit: Payer: 59 | Attending: Cardiology | Admitting: Cardiology

## 2022-11-12 VITALS — BP 125/76 | HR 74 | Wt 194.0 lb

## 2022-11-12 DIAGNOSIS — I5022 Chronic systolic (congestive) heart failure: Secondary | ICD-10-CM

## 2022-11-12 MED ORDER — ENTRESTO 49-51 MG PO TABS: 1.0000 | ORAL_TABLET | Freq: Two times a day (BID) | ORAL | 5 refills | Status: DC

## 2022-11-12 NOTE — Patient Instructions (Addendum)
INCREASE ENTRESTO TO 49-51 MG TWICE DAILY  DECREASE APSIRIN TO 81 MG - YOU CAN GET THIS OVER THE COUNTER AT THE PHARMACY  GO DOWN TO LOWER LEVEL (LL) TO GET YOUR BLOOD WORK   COMPLETED INSIDE OF HEARTCARE OFFICE  GET REPEAT BLOOD WORK IN 10 DAYS AT THE MEDICAL MALL  FOLLOW UP IN 4 MONTHS

## 2022-11-12 NOTE — Progress Notes (Signed)
PCP: Eden Emms, NP HF Cardiology: Dr. Shirlee Latch  67 y.o. with history of CHF, HTN, CKD stage 3 who was referred to HF MD by Clarisa Kindred.  Patient has a long history of cardiomyopathy.  In 11/16, echo was done showing EF 25-30%.  Cath at that time showed nonobstructive CAD.  Over the years, his LV function improved, with echoes in 2021 and 2022 showing EF 45-50%. He was a prior heavy drinker but quit several years ago. He is also a prior smoker. ETOH was thought to contribute to his cardiomyopathy. He had a long admission in 10/22 with ruptured appendicitis and sepsis.  This admission was also complicated by splenic rupture and splenectomy.  GDMT in recent years appears to have been limited by low BP and renal dysfunction.    In 1/24, he had an echo done that showed EF 25-30% with mild LV dilation and mild LVH. This was a signficant fall from previous years.  He did not report any significant changes around this time. LHC/RHC in 4/24 showed 60% pLAD stenosis, 40% D1; normal filling pressures and CI 2.61.   Patient currently does not smoke cigarettes or drink, he does use marijuana occasionally.  No other drugs.    Cardiac MRI (7/24) with EF 41%, RVEF 49%, subtle mid-wall LGE in the basal septal wall (nonspecific), ECV 33%.   He returns for followup of CHF.  Weight stable.  No complaints, denies exertional dyspnea or chest pain.  No orthopnea/PND.  No lightheadedness.  Walking about 1 mile/day for exercise.    ECG (personally reviewed): NSR, LVH with repolarization abnormality.   Labs (11/13): LDL 80 Labs (2/24): K 4.3, creatinine 1.42 Labs (5/24): K 4.1 => 4.8, creatinine 1.57 => 1.55, LDL 80 Labs (6/24): K 4.5, creatinine 1.4, BNP 26  PMH: 1. Hyperlipidemia 2. Gout 3. HTN 4. GI bleeding 5. Type 2 diabetes 6. H/o ETOH abuse: Quit drinking in 2022.  7. Central retinal artery occlusion: Left eye, 2015.  8. CKD stage 3 9. Appendicitis: 10/22 hospitalization with ruptured appendix, also  complicated by splenic rupture needing splenectomy.  10. H/o LUE DVT: Thought to be related to PICC.  11. H/o TIA 12. Chronic systolic CHF: Echo in 11/16 with EF 25-30%, cath in 2016 and 4/24with nonobstructive CAD.  - Echo (5/18): EF 35-40%.  - Echo (3/21): EF 35-40% - Echo (8/21): EF 45-50% - Echo (10/22): EF 45-50%, mild MR - Echo (3/24): EF 25-30%, mild LV dilation, mild LVH, normal RV, mild MR.  - RHC (4/24): mean RA 5, PA 36/12, mean PCWP 9, CI 2.61 - Cardiac MRI (7/24) with EF 41%, RVEF 49%, subtle mid-wall LGE in the basal septal wall (nonspecific), ECV 33%.  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 CAD: Cath (4/24) with 60% pLAD, 40% D1.   Social History   Socioeconomic History   Marital status: Single    Spouse name: Not on file   Number of children: 0   Years of education: 12   Highest education level: High school graduate  Occupational History   Occupation: Retired  Tobacco Use   Smoking status: Former    Current packs/day: 0.00    Average packs/day: 0.8 packs/day for 30.0 years (22.5 ttl pk-yrs)    Types: Cigarettes    Start date: 12/1990    Quit date: 12/2020    Years since quitting: 1.8   Smokeless tobacco: Never  Vaping Use   Vaping status: Never Used  Substance and Sexual Activity  Alcohol use: Not Currently   Drug use: Not Currently   Sexual activity: Yes    Birth control/protection: Condom  Other Topics Concern   Not on file  Social History Narrative   Not on file   Social Determinants of Health   Financial Resource Strain: Low Risk  (08/02/2022)   Overall Financial Resource Strain (CARDIA)    Difficulty of Paying Living Expenses: Not hard at all  Food Insecurity: No Food Insecurity (08/02/2022)   Hunger Vital Sign    Worried About Running Out of Food in the Last Year: Never true    Ran Out of Food in the Last Year: Never true  Transportation Needs: No Transportation Needs (08/02/2022)   PRAPARE - Administrator, Civil Service  (Medical): No    Lack of Transportation (Non-Medical): No  Physical Activity: Insufficiently Active (08/02/2022)   Exercise Vital Sign    Days of Exercise per Week: 7 days    Minutes of Exercise per Session: 20 min  Stress: No Stress Concern Present (08/02/2022)   [employer] of Occupational Health - Occupational Stress Questionnaire    Feeling of Stress : Not at all  Social Connections: Moderately Isolated (08/02/2022)   Social Connection and Isolation Panel [NHANES]    Frequency of Communication with Friends and Family: More than three times a week    Frequency of Social Gatherings with Friends and Family: More than three times a week    Attends Religious Services: More than 4 times per year    Active Member of [employer] or Organizations: No    Attends Banker Meetings: Never    Marital Status: Never married  Intimate Partner Violence: Not At Risk (08/02/2022)   Humiliation, Afraid, Rape, and Kick questionnaire    Fear of Current or Ex-Partner: No    Emotionally Abused: No    Physically Abused: No    Sexually Abused: No   Family History  Problem Relation Age of Onset   Diabetes Mother    Hypertension Mother    Diabetes Father    Hypertension Father    Diabetes Sister    Dementia Brother    Cancer Neg Hx    Heart disease Neg Hx    Stroke Neg Hx    Colon cancer Neg Hx    Esophageal cancer Neg Hx    Rectal cancer Neg Hx    Stomach cancer Neg Hx    ROS: All systems reviewed and negative except as per HPI.   Current Outpatient Medications on File Prior to Visit  Medication Sig Dispense Refill   acetaminophen (TYLENOL) 500 MG tablet Take 2 tablets (1,000 mg total) by mouth every 8 (eight) hours as needed. 30 tablet 0   allopurinol (ZYLOPRIM) 100 MG tablet TAKE ONE TABLET BY MOUTH TWICE A DAY 180 tablet 1   aspirin (ASPIRIN CHILDRENS) 81 MG chewable tablet Chew 1 tablet (81 mg total) by mouth daily. (Patient taking differently: Chew 81 mg by mouth in the morning and at  bedtime.) 36 tablet 11   atorvastatin (LIPITOR) 80 MG tablet Take 1 tablet (80 mg total) by mouth daily. 90 tablet 3   Blood Pressure KIT Check b/p once daily. Based on insurance preference 1 kit 0   carvedilol (COREG) 6.25 MG tablet TAKE ONE TABLET BY MOUTH TWICE A DAY 180 tablet 3   cetirizine (ZYRTEC) 10 MG tablet Take 1 tablet (10 mg total) by mouth daily. 90 tablet 3   diphenhydrAMINE (BENADRYL) 25 MG tablet  Take 25 mg by mouth daily as needed for allergies.     fenofibrate (TRICOR) 145 MG tablet TAKE ONE TABLET BY MOUTH ONCE A DAY 90 tablet 1   ferrous sulfate 325 (65 FE) MG tablet Take 1 tablet (325 mg total) by mouth daily with breakfast. 90 tablet 2   fluticasone (FLONASE) 50 MCG/ACT nasal spray Place 1-2 sprays into both nostrils daily as needed for allergies or rhinitis.     glimepiride (AMARYL) 1 MG tablet TAKE ONE TABLET BY MOUTH EVERY MORNING WITH BREAKFAST 30 tablet 0   hydroxypropyl methylcellulose / hypromellose (ISOPTO TEARS / GONIOVISC) 2.5 % ophthalmic solution Place 1 drop into both eyes 2 (two) times daily.     magnesium oxide (MAG-OX) 400 MG tablet Take 1 tablet (400 mg total) by mouth daily. 90 tablet 1   metFORMIN (GLUCOPHAGE) 500 MG tablet TAKE TWO TABLETS BY MOUTH TWICE A DAY WITH A MEAL 360 tablet 1   multivitamin (ONE-A-DAY MEN'S) TABS tablet Take 1 tablet by mouth daily with breakfast.     Omega-3 Fatty Acids (FISH OIL PO) Take 1 capsule by mouth daily.     pantoprazole (PROTONIX) 40 MG tablet TAKE ONE TABLET BY MOUTH ONCE A DAY 90 tablet 1   spironolactone (ALDACTONE) 25 MG tablet Take 1 tablet (25 mg total) by mouth daily. 90 tablet 3   triamcinolone cream (KENALOG) 0.5 % Apply 1 Application topically 2 (two) times daily. For 7 days 30 g 0   albuterol (VENTOLIN HFA) 108 (90 Base) MCG/ACT inhaler Inhale 2 puffs into the lungs every 6 (six) hours as needed for wheezing or shortness of breath. (Patient not taking: Reported on 09/19/2022)     No current  facility-administered medications on file prior to visit.   BP 125/76   Pulse 74   Wt 194 lb (88 kg)   SpO2 98%   BMI 23.61 kg/m  General: NAD Neck: No JVD, no thyromegaly or thyroid nodule.  Lungs: Clear to auscultation bilaterally with normal respiratory effort. CV: Nondisplaced PMI.  Heart regular S1/S2, no S3/S4, no murmur.  No peripheral edema.  No carotid bruit.  Normal pedal pulses.  Abdomen: Soft, nontender, no hepatosplenomegaly, no distention.  Skin: Intact without lesions or rashes.  Neurologic: Alert and oriented x 3.  Psych: Normal affect. Extremities: No clubbing or cyanosis.  HEENT: Normal.   Assessment/Plan: 1. Chronic systolic CHF: Long-standing, thought to be nonischemic cardiomyopathy possibly due to prior ETOH.  Initial echo in 11/16 showed EF 25-30%, cath in 2016 with nonobstructive CAD.  By 2021 and 2022 echoes, EF had improved to 45-50%.  Echo done in 1/24 showed significant drop in EF to 25-30% with normal RV, mild MR.  He no longer drinks ETOH and has not smoked for several years.  Repeat cath in 4/24 showed nonobstructive CAD. Cardiac MRI (7/24) with EF 41%, RVEF 49%, subtle mid-wall LGE in the basal septal wall (nonspecific), ECV 33%.  Not volume overloaded on exam.  NYHA class I.  - Continue Coreg 6.25 mg bid.  - Off Farxiga due to "dehydration," says he felt terrible on it and does not want to retry.   - Continue spironolactone 25 daily.   - Increase Entresto to 49/51 bid, BMET/BNP today and BMET in 10 days.  - Will need slow and careful GDMT titration with history of dehydration and orthostasis.  - EF out of range for ICD on 7/24 cMRI. Narrow QRS so not candidate for CRT.   2. CKD stage 3:  BMET today.  3. H/o DVT: Related to PICC line, no longer on anticoagulation.  4. CAD: Cath 4/24 with 60% pLAD, 40% D1.   - Continue ASA 81 (has been taking twice daily, only needs once daily).  - Goal LDL < 55, check lipids today on atorvastatin 80 daily.    Followup 4  months.    Marca Ancona 11/12/2022

## 2022-11-13 LAB — BASIC METABOLIC PANEL
BUN/Creatinine Ratio: 14 (ref 10–24)
BUN: 19 mg/dL (ref 8–27)
CO2: 20 mmol/L (ref 20–29)
Calcium: 9.8 mg/dL (ref 8.6–10.2)
Chloride: 104 mmol/L (ref 96–106)
Creatinine, Ser: 1.35 mg/dL — ABNORMAL HIGH (ref 0.76–1.27)
Glucose: 80 mg/dL (ref 70–99)
Potassium: 4.9 mmol/L (ref 3.5–5.2)
Sodium: 139 mmol/L (ref 134–144)
eGFR: 58 mL/min/{1.73_m2} — ABNORMAL LOW (ref >59)

## 2022-11-13 LAB — LIPID PANEL
Chol/HDL Ratio: 3.8 ratio (ref 0.0–5.0)
Cholesterol, Total: 136 mg/dL (ref 100–199)
HDL: 36 mg/dL — ABNORMAL LOW (ref >39)
LDL Chol Calc (NIH): 80 mg/dL (ref 0–99)
Triglycerides: 109 mg/dL (ref 0–149)
VLDL Cholesterol Cal: 20 mg/dL (ref 5–40)

## 2022-11-13 LAB — BRAIN NATRIURETIC PEPTIDE: BNP: 11 pg/mL (ref 0.0–100.0)

## 2022-11-14 ENCOUNTER — Telehealth (HOSPITAL_COMMUNITY): Payer: Self-pay

## 2022-11-14 DIAGNOSIS — I5022 Chronic systolic (congestive) heart failure: Secondary | ICD-10-CM

## 2022-11-14 MED ORDER — EZETIMIBE 10 MG PO TABS: 10.0000 mg | ORAL_TABLET | Freq: Every day | ORAL | 11 refills | Status: DC

## 2022-11-14 NOTE — Telephone Encounter (Signed)
-----   Message from [employer] sent at 11/13/2022  2:40 PM EDT ----- Goal LDL < 55, add Zetia 10 mg daily. Lipids 2 months.

## 2022-11-14 NOTE — Telephone Encounter (Signed)
Patient's medication Zetia has been sent to his pharmacy. In addition, his labs has been placed and his appointment scheduled. Pt aware, agreeable, and verbalized understanding.

## 2022-11-23 ENCOUNTER — Other Ambulatory Visit
Admission: RE | Admit: 2022-11-23 | Discharge: 2022-11-23 | Disposition: A | Discharge status: Home / Self Care | Payer: 59 | Source: Ambulatory Visit | Attending: Family | Admitting: Family

## 2022-11-23 ENCOUNTER — Encounter: Payer: Self-pay | Admitting: Family

## 2022-11-23 ENCOUNTER — Ambulatory Visit: Payer: 59 | Admitting: Family

## 2022-11-23 ENCOUNTER — Telehealth: Payer: Self-pay

## 2022-11-23 VITALS — BP 139/74 | HR 75 | Ht 76.0 in | Wt 194.0 lb

## 2022-11-23 DIAGNOSIS — I2510 Atherosclerotic heart disease of native coronary artery without angina pectoris: Secondary | ICD-10-CM | POA: Diagnosis not present

## 2022-11-23 DIAGNOSIS — N1831 Chronic kidney disease, stage 3a: Secondary | ICD-10-CM | POA: Diagnosis not present

## 2022-11-23 DIAGNOSIS — E785 Hyperlipidemia, unspecified: Secondary | ICD-10-CM

## 2022-11-23 DIAGNOSIS — I5022 Chronic systolic (congestive) heart failure: Secondary | ICD-10-CM | POA: Insufficient documentation

## 2022-11-23 DIAGNOSIS — I10 Essential (primary) hypertension: Secondary | ICD-10-CM

## 2022-11-23 DIAGNOSIS — E1122 Type 2 diabetes mellitus with diabetic chronic kidney disease: Secondary | ICD-10-CM | POA: Diagnosis not present

## 2022-11-23 LAB — BASIC METABOLIC PANEL
Anion gap: 10 (ref 5–15)
BUN: 24 mg/dL — ABNORMAL HIGH (ref 8–23)
CO2: 24 mmol/L (ref 22–32)
Calcium: 9.6 mg/dL (ref 8.9–10.3)
Chloride: 105 mmol/L (ref 98–111)
Creatinine, Ser: 1.35 mg/dL — ABNORMAL HIGH (ref 0.61–1.24)
GFR, Estimated: 58 mL/min — ABNORMAL LOW (ref >60)
Glucose, Bld: 84 mg/dL (ref 70–99)
Potassium: 5 mmol/L (ref 3.5–5.1)
Sodium: 139 mmol/L (ref 135–145)

## 2022-11-23 NOTE — Progress Notes (Signed)
PCP: Mordecai Maes, NP (last seen 05/24) Primary Cardiologist: Riccardo Dubin, MD (last seen 05/24) HF MD: Marca Ancona, MD (last seen 08/24)  HPI:  Travis [name] is a 67 y/o male with a history of osteoarthritis, hyperlipidemia, gout, HTN, GI bleed, type 2 DM, CAD, anemia, asthma, previous alcohol use (quit 2022), central retinal artery occlusion left eye (2015), CKD, appendicitis 10/22 complicated by sepsis and splenic rupture needing splenectomy which resulted in a lengthy admission, LUE DVT, TIA,  current tobacco use and chronic heart failure.  Has not been admitted or been in the ED in the last 6 months  Echo 2015: EF 45-50%. Echo 01/26/15: EF of 25-30% along with trivial Travis/ TR. Echo 08/08/16: EF of 35-40%. Echo 06/04/19: EF of 35-40% along with mild LVH. Echo 11/09/19: EF of 45-50% along with mild LVH Echo 01/07/21: EF of 45-50% along with mild Travis. Echo 06/19/22: EF 25-30% with mild LVH, Grade I DD, mild Travis and mild LAE        LHC 01/28/15:  10% stenosis in Prox RCA to Mid RCA and 30% stenosis in Prox LAD to Mid LAD which is not much different from previous angiography.  Cardiomyopathy felt to be due to previous alcohol use.   RHC/ LHC 07/17/22:   Prox LAD to Mid LAD lesion is 60% stenosed.   1st Diag lesion is 40% stenosed.  1. Normal filling pressures and preserved cardiac output.  2. Moderate CAD with 60% calcified stenosis proximal LAD/D1.  I do not think this is flow limiting and does not explain fall in EF.     cMRI 10/11/22: 1.  Normal LV size with diffuse hypokinesis, EF 41%.  2.  Normal RV size with mildly decreased systolic function, EF 49%.  3. Subtle mid-wall LGE in the basal septum, this is nonspecific and often seen with nonischemic cardiomyopathies.  4. Mildly elevated extracellular volume percentage, suggesting increased myocardial fibrosis.  He presents today for a HF f/u visit with a chief complaint of intermittent dizziness. Says it tends to occur if he changes  position too quickly. Denies fatigue, SOB, chest pain, cough, palpitations, abdominal distention, pedal edema, weight gain or difficulty sleeping.   Endorses frustration with the quantity of pills that he has to take. He understands that if he needs to take them all, he will but asks that his meds be looked at. Reviewed his med list and that each disease state needs multiple meds to address the issues.   ROS: All systems negative except as listed in HPI, PMH and Problem List.  SH:  Social History   Socioeconomic History   Marital status: Single    Spouse name: Not on file   Number of children: 0   Years of education: 12   Highest education level: High school graduate  Occupational History   Occupation: Retired  Tobacco Use   Smoking status: Former    Current packs/day: 0.00    Average packs/day: 0.8 packs/day for 30.0 years (22.5 ttl pk-yrs)    Types: Cigarettes    Start date: 12/1990    Quit date: 12/2020    Years since quitting: 1.9   Smokeless tobacco: Never  Vaping Use   Vaping status: Never Used  Substance and Sexual Activity   Alcohol use: Not Currently   Drug use: Not Currently   Sexual activity: Yes    Birth control/protection: Condom  Other Topics Concern   Not on file  Social History Narrative   Not on file   Social  Determinants of Health   Financial Resource Strain: Low Risk  (08/02/2022)   Overall Financial Resource Strain (CARDIA)    Difficulty of Paying Living Expenses: Not hard at all  Food Insecurity: No Food Insecurity (08/02/2022)   Hunger Vital Sign    Worried About Running Out of Food in the Last Year: Never true    Ran Out of Food in the Last Year: Never true  Transportation Needs: No Transportation Needs (08/02/2022)   PRAPARE - Administrator, Civil Service (Medical): No    Lack of Transportation (Non-Medical): No  Physical Activity: Insufficiently Active (08/02/2022)   Exercise Vital Sign    Days of Exercise per Week: 7 days    Minutes  of Exercise per Session: 20 min  Stress: No Stress Concern Present (08/02/2022)   [employer] of Occupational Health - Occupational Stress Questionnaire    Feeling of Stress : Not at all  Social Connections: Moderately Isolated (08/02/2022)   Social Connection and Isolation Panel [NHANES]    Frequency of Communication with Friends and Family: More than three times a week    Frequency of Social Gatherings with Friends and Family: More than three times a week    Attends Religious Services: More than 4 times per year    Active Member of [employer] or Organizations: No    Attends Banker Meetings: Never    Marital Status: Never married  Intimate Partner Violence: Not At Risk (08/02/2022)   Humiliation, Afraid, Rape, and Kick questionnaire    Fear of Current or Ex-Partner: No    Emotionally Abused: No    Physically Abused: No    Sexually Abused: No    FH:  Family History  Problem Relation Age of Onset   Diabetes Mother    Hypertension Mother    Diabetes Father    Hypertension Father    Diabetes Sister    Dementia Brother    Cancer Neg Hx    Heart disease Neg Hx    Stroke Neg Hx    Colon cancer Neg Hx    Esophageal cancer Neg Hx    Rectal cancer Neg Hx    Stomach cancer Neg Hx     Past Medical History:  Diagnosis Date   Alcohol abuse    Alcoholic cardiomyopathy (HCC) 47/82/9562   a. 05/2019 Echo: EF 35-40%; b. 10/2019 Echo: EF 45-50%.   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 12/2010 Echo: EF 20-25%; b 08/2013 Echo: EF 45-50%; c. 01/2015 Echo: EF 25-30%; d. 07/2016 Echo: EF 35-40%; e. 05/2019 Echo: EF 35-40%; d. 10/2019 Echo: EF 45-50%, mild LVH, mild red RV fxn, mildl dil RA,  Triv Travis. Mild to mod Ao Sclerosis w/o stenosis.   Hyperlipidemia    Hypertension    Nonischemic cardiomyopathy (HCC)    Nonobstructive Coronary artery disease    a. 01/2015 Cath: LM nl, LAD 30p/m, LCX nl, RCA 10p/m, RPDA min irregs.   Osteoarthritis    Pneumonia    TIA (transient ischemic attack)    Tobacco abuse    Vision  loss    peripherial vision only left eye.Central Retinal  artery occusion     Current Outpatient Medications  Medication Sig Dispense Refill   acetaminophen (TYLENOL) 500 MG tablet Take 2 tablets (1,000 mg total) by mouth every 8 (eight) hours as needed. 30 tablet 0   albuterol (VENTOLIN HFA) 108 (90 Base) MCG/ACT inhaler Inhale 2 puffs into the lungs every 6 (six) hours as needed for wheezing or shortness of breath. (Patient not taking: Reported on 09/19/2022)     allopurinol (ZYLOPRIM) 100 MG tablet TAKE ONE TABLET BY MOUTH TWICE A DAY 180 tablet 1   aspirin (ASPIRIN CHILDRENS) 81 MG chewable tablet Chew 1 tablet (81 mg total) by mouth daily. (Patient taking differently: Chew 81 mg by mouth in the morning and at bedtime.) 36 tablet 11   atorvastatin (LIPITOR) 80 MG tablet Take 1 tablet (80 mg total) by mouth daily. 90 tablet 3   Blood Pressure KIT Check b/p once daily. Based on insurance preference 1 kit 0   carvedilol (COREG) 6.25 MG tablet TAKE ONE TABLET BY MOUTH TWICE A DAY 180 tablet 3   cetirizine (ZYRTEC) 10 MG tablet Take 1 tablet (10 mg total) by mouth daily. 90 tablet 3   diphenhydrAMINE (BENADRYL) 25 MG tablet Take 25 mg by mouth daily as needed for allergies.     ezetimibe (ZETIA) 10 MG tablet Take 1 tablet (10 mg total) by mouth daily. 30 tablet 11   fenofibrate (TRICOR) 145 MG tablet TAKE ONE TABLET BY MOUTH ONCE A DAY 90 tablet 1   ferrous sulfate 325 (65 FE) MG tablet Take 1 tablet (325 mg total) by mouth daily with breakfast. 90 tablet 2   fluticasone (FLONASE) 50 MCG/ACT nasal spray Place 1-2 sprays into both nostrils daily as needed for allergies or  rhinitis.     glimepiride (AMARYL) 1 MG tablet TAKE ONE TABLET BY MOUTH EVERY MORNING WITH BREAKFAST 30 tablet 0   hydroxypropyl methylcellulose / hypromellose (ISOPTO TEARS / GONIOVISC) 2.5 % ophthalmic solution Place 1 drop into both eyes 2 (two) times daily.     magnesium oxide (MAG-OX) 400 MG tablet Take 1 tablet (400 mg total) by mouth daily. 90 tablet 1   metFORMIN (GLUCOPHAGE) 500 MG tablet TAKE TWO TABLETS BY MOUTH TWICE A DAY WITH A MEAL 360 tablet 1   multivitamin (ONE-A-DAY MEN'S) TABS tablet Take 1 tablet by mouth daily with breakfast.     Omega-3 Fatty Acids (FISH OIL PO) Take 1 capsule by mouth daily.     pantoprazole (PROTONIX) 40 MG tablet TAKE ONE TABLET BY MOUTH ONCE A DAY 90 tablet 1   sacubitril-valsartan (ENTRESTO) 49-51 MG Take 1 tablet by mouth 2 (two) times daily. 60 tablet 5   spironolactone (ALDACTONE) 25 MG tablet Take 1 tablet (25 mg total) by mouth daily. 90 tablet 3   triamcinolone cream (KENALOG) 0.5 % Apply 1 Application topically 2 (two) times daily. For 7 days 30 g 0   No current facility-administered medications for this visit.   Vitals:   11/23/22 0859  BP: 139/74  Pulse: 75  SpO2: 100%  Weight: 194 lb (88 kg)  Height: 6\' 4"  (1.93 m)   Wt Readings from Last 3 Encounters:  11/23/22 194 lb (88 kg)  11/12/22 194 lb (88 kg)  09/19/22 194 lb (88 kg)   Lab Results  Component Value Date   CREATININE 1.35 (H) 11/12/2022   CREATININE 1.40 (H) 09/19/2022   CREATININE 1.55 (H) 08/16/2022  PHYSICAL EXAM:  General:  Well appearing. No resp difficulty HEENT: normal Neck: supple. JVP flat. No lymphadenopathy or thryomegaly appreciated. Cor: PMI normal. Regular rate & rhythm. No rubs, gallops or murmurs. Lungs: clear Abdomen: soft, nontender, nondistended. No hepatosplenomegaly. No bruits or masses.  Extremities: no cyanosis, clubbing, rash, edema Neuro: alert & orientedx3, cranial nerves grossly intact. Moves all 4 extremities w/o difficulty. Affect  pleasant.   ECG: not done   ASSESSMENT & PLAN:  1: NICM with reduced ejection fraction- - suspect due to previous heavy alcohol use as cath showed nonobstructive CAD - NYHA class I - euvolemic today - weighing daily; reminded to call for an overnight weight gain of >2 pounds/weekly weight gain of >5 pounds.  - weight stable from last visit here 10 days ago - Echo 2015: EF 45-50%. - Echo 01/26/15: EF of 25-30% along with trivial Travis/ TR. - Echo 08/08/16: EF of 35-40%. - Echo 06/04/19: EF of 35-40% along with mild LVH. - Echo 11/09/19: EF of 45-50% along with mild LVH - Echo 01/07/21: EF of 45-50% along with mild Travis. - Echo 06/19/22: EF 25-30% with mild LVH, Grade I DD, mild Travis and mild LAE     - cMRI 10/11/22: 1.  Normal LV size with diffuse hypokinesis, EF 41%.  2.  Normal RV size with mildly decreased systolic function, EF 49%.  3. Subtle mid-wall LGE in the basal septum, this is nonspecific and often seen with nonischemic cardiomyopathies.  4. Mildly elevated extracellular volume percentage, suggesting increased myocardial fibrosis. - EF out of range for ICD on 7/24 cMRI. Narrow QRS so not candidate for CRT. - sometimes cooks with salt - Drinking approximately 64 oz of fluid daily - continue carvedilol 6.25mg  BID - continue spironolactone 25mg  daily - continue entresto 49/51mg  BID; discussed titration at next visit - tried farxiga in the past but he felt terrible on it - will need slow and careful GDMT titration with history of dehydration and orthostasis.  - BMP today - palliative care visit done 11/06/21 - BNP 09/19/22 was 26.2  2: HTN- - BP 139/74 - saw PCP Toney Reil) 05/24 - BMP from 11/23/22 reviewed and shows sodium 139, potassium 5.0, creatinine 1.35 and GFR 58 - saw nephrology Suezanne Jacquet) 09/22  3: Hyperlipidemia- - continues to not smoke - LDL 11/12/22 was 80 - continue atorvastatin 80mg  daily - continue zetia 10mg  daily - continue fenofibrate 145mg  daily  4: DM- -  checks blood glucose twice a week - A1c 08/16/22 was 6.7% - continue glimepiride 1mg  daily - continue metformin 500mg  BID  5: CAD- - saw cardiology Kirke Corin) 05/24 - continue ASA 81mg  daily - continue atorvastatin 80mg  daily RHC/ LHC 07/17/22:   Prox LAD to Mid LAD lesion is 60% stenosed.   1st Diag lesion is 40% stenosed.  1. Normal filling pressures and preserved cardiac output.  2. Moderate CAD with 60% calcified stenosis proximal LAD/D1.  I do not think this is flow limiting and does not explain fall in EF.     Return in 4-6 weeks, sooner if needed.

## 2022-11-23 NOTE — Telephone Encounter (Signed)
-----   Message from Nurse Sheryn Bison sent at 11/23/2022  2:30 PM EDT -----  ----- Message ----- From: Delma Freeze, FNP Sent: 11/23/2022  12:45 PM EDT To: [facility] Triage  Kidney function is stable. Potassium is normal although a little on the high side. Do not use NoSalt for seasoning. Limit foods high in sodium like bananas, dark leafy greens, potatoes. Recheck BMP in 2 weeks.

## 2022-12-11 ENCOUNTER — Other Ambulatory Visit: Payer: Self-pay | Admitting: Nurse Practitioner

## 2022-12-11 DIAGNOSIS — E1165 Type 2 diabetes mellitus with hyperglycemia: Secondary | ICD-10-CM

## 2022-12-19 ENCOUNTER — Ambulatory Visit: Payer: 59 | Admitting: Nurse Practitioner

## 2022-12-19 VITALS — BP 128/74 | HR 74 | Temp 97.6°F | Ht 76.0 in | Wt 196.4 lb

## 2022-12-19 DIAGNOSIS — E119 Type 2 diabetes mellitus without complications: Secondary | ICD-10-CM | POA: Diagnosis not present

## 2022-12-19 DIAGNOSIS — M545 Low back pain, unspecified: Secondary | ICD-10-CM

## 2022-12-19 DIAGNOSIS — M2550 Pain in unspecified joint: Secondary | ICD-10-CM | POA: Insufficient documentation

## 2022-12-19 DIAGNOSIS — G8929 Other chronic pain: Secondary | ICD-10-CM

## 2022-12-19 DIAGNOSIS — Z7984 Long term (current) use of oral hypoglycemic drugs: Secondary | ICD-10-CM

## 2022-12-19 DIAGNOSIS — I10 Essential (primary) hypertension: Secondary | ICD-10-CM | POA: Diagnosis not present

## 2022-12-19 LAB — BASIC METABOLIC PANEL
BUN: 18 mg/dL (ref 6–23)
CO2: 26 mEq/L (ref 19–32)
Calcium: 9.2 mg/dL (ref 8.4–10.5)
Chloride: 105 mEq/L (ref 96–112)
Creatinine, Ser: 1.28 mg/dL (ref 0.40–1.50)
GFR: 58.15 mL/min — ABNORMAL LOW (ref >60.00)
Glucose, Bld: 124 mg/dL — ABNORMAL HIGH (ref 70–99)
Potassium: 4.4 mEq/L (ref 3.5–5.1)
Sodium: 138 mEq/L (ref 135–145)

## 2022-12-19 LAB — POCT GLYCOSYLATED HEMOGLOBIN (HGB A1C): Hemoglobin A1C: 6.1 % — AB (ref 4.0–5.6)

## 2022-12-19 LAB — CK: Total CK: 94 U/L (ref 7–232)

## 2022-12-19 MED ORDER — TRAMADOL HCL 50 MG PO TABS
50.0000 mg | ORAL_TABLET | Freq: Three times a day (TID) | ORAL | 0 refills | Status: AC | PRN
Start: 2022-12-19 — End: 2022-12-24

## 2022-12-19 NOTE — Assessment & Plan Note (Signed)
Patient currently maintained on metformin 1500 mg daily and glimepiride 1 mg daily.  A1c patient checks glucose infrequently but denies any hypoglycemia.  Did put off information about signs and symptoms of hypoglycemia.  Continue metformin 1500 mg daily along with glimepiride 1 mg daily.Goal at 6.1%.

## 2022-12-19 NOTE — Patient Instructions (Signed)
Nice to see you today Your A1C was great at 6.1% Follow up with me in 6 months, sooner if you need me

## 2022-12-19 NOTE — Assessment & Plan Note (Signed)
History of same.  Patient uses tramadol sparingly and as needed.  Refill provided today

## 2022-12-19 NOTE — Assessment & Plan Note (Signed)
Patient currently maintained on Entresto, carvedilol, spironolactone.  Blood pressure well-controlled.  Patient tolerating medications well.  Continue

## 2022-12-19 NOTE — Progress Notes (Signed)
Established Patient Office Visit  Subjective   Patient ID: Travis [name], male    DOB: 1955-07-07  Age: 67 y.o. MRN: [id number]  Chief Complaint  Patient presents with   Diabetes    Pt states that he has been okay.      DM2: patient is currently on glimeparide 1mg , metformin 1000mg  BID States that he has not been checking it often, states once every 2-3 weeks. States that the numbers have not been high He is getting 3 meals a day. He will do tea and lemonade mixed. Water and sometimes a soda   HTN: Patient currently maintained on spironolactone, Entresto, carvedilol.  HLD: Patient currently maintained on atorvastatin 80 mg, ezetimibe, fenofibrate.  Patient is making some achy joints on occasion worse with weather changes per his report likely OA.    Review of Systems  Constitutional:  Negative for chills and fever.  Respiratory:  Negative for shortness of breath.   Cardiovascular:  Negative for chest pain.  Gastrointestinal:  Positive for diarrhea (intermittent with metformin).       Bm daily   Neurological:  Negative for tingling and headaches.  Psychiatric/Behavioral:  Negative for hallucinations.       Objective:     BP 128/74   Pulse 74   Temp 97.6 F (36.4 C) (Temporal)   Ht 6\' 4"  (1.93 m)   Wt 196 lb 6.4 oz (89.1 kg)   SpO2 95%   BMI 23.91 kg/m  BP Readings from Last 3 Encounters:  12/19/22 128/74  11/23/22 139/74  11/12/22 125/76   Wt Readings from Last 3 Encounters:  12/19/22 196 lb 6.4 oz (89.1 kg)  11/23/22 194 lb (88 kg)  11/12/22 194 lb (88 kg)   SpO2 Readings from Last 3 Encounters:  12/19/22 95%  11/23/22 100%  11/12/22 98%      Physical Exam Vitals and nursing note reviewed.  Constitutional:      Appearance: Normal appearance.  Cardiovascular:     Rate and Rhythm: Normal rate and regular rhythm.     Heart sounds: Normal heart sounds.  Pulmonary:     Effort: Pulmonary effort is normal.     Breath sounds: Normal breath sounds.   Abdominal:     General: Bowel sounds are normal.  Musculoskeletal:     Right lower leg: No edema.     Left lower leg: No edema.  Neurological:     Mental Status: He is alert.      Results for orders placed or performed in visit on 12/19/22  POCT glycosylated hemoglobin (Hb A1C)  Result Value Ref Range   Hemoglobin A1C 6.1 (A) 4.0 - 5.6 %   HbA1c POC (<> result, manual entry)     HbA1c, POC (prediabetic range)     HbA1c, POC (controlled diabetic range)        The 10-year ASCVD risk score (Arnett DK, et al., 2019) is: 44.7%    Assessment & Plan:   Problem List Items Addressed This Visit       Cardiovascular and Mediastinum   Essential hypertension    Patient currently maintained on Entresto, carvedilol, spironolactone.  Blood pressure well-controlled.  Patient tolerating medications well.  Continue      Relevant Orders   Basic metabolic panel     Endocrine   Diabetes (HCC) - Primary    Patient currently maintained on metformin 1500 mg daily and glimepiride 1 mg daily.  A1c patient checks glucose infrequently but denies any hypoglycemia.  Did put off information about signs and symptoms of hypoglycemia.  Continue metformin 1500 mg daily along with glimepiride 1 mg daily.Goal at 6.1%.      Relevant Orders   POCT glycosylated hemoglobin (Hb A1C) (Completed)   Basic metabolic panel     Other   Chronic bilateral low back pain without sciatica    History of same.  Patient uses tramadol sparingly and as needed.  Refill provided today      Relevant Medications   predniSONE (DELTASONE) 10 MG tablet   traMADol (ULTRAM) 50 MG tablet   Pain in joint, multiple sites    Likely OA.  Will check CK to make sure not related to statin therapy.      Relevant Orders   CK    Return in about 6 months (around 06/18/2023) for DM recheck.    Audria Nine, NP

## 2022-12-19 NOTE — Assessment & Plan Note (Signed)
Likely OA.  Will check CK to make sure not related to statin therapy.

## 2022-12-25 ENCOUNTER — Other Ambulatory Visit: Payer: Self-pay | Admitting: Nurse Practitioner

## 2022-12-25 DIAGNOSIS — E1165 Type 2 diabetes mellitus with hyperglycemia: Secondary | ICD-10-CM

## 2022-12-27 ENCOUNTER — Other Ambulatory Visit: Payer: Self-pay | Admitting: Nurse Practitioner

## 2023-01-01 ENCOUNTER — Encounter: Payer: 59 | Admitting: Family

## 2023-01-01 ENCOUNTER — Telehealth: Payer: Self-pay | Admitting: Family

## 2023-01-01 ENCOUNTER — Other Ambulatory Visit: Payer: Self-pay | Admitting: Nurse Practitioner

## 2023-01-01 DIAGNOSIS — E1165 Type 2 diabetes mellitus with hyperglycemia: Secondary | ICD-10-CM

## 2023-01-01 NOTE — Progress Notes (Unsigned)
PCP: Mordecai Maes, NP (last seen 09/24) Primary Cardiologist: Riccardo Dubin, MD (last seen 05/24) HF MD: Marca Ancona, MD (last seen 08/24)  HPI:  Travis [name] is a 67 y/o male with a history of osteoarthritis, hyperlipidemia, gout, HTN, GI bleed, type 2 DM, CAD, anemia, asthma, previous alcohol use (quit 2022), central retinal artery occlusion left eye (2015), CKD, appendicitis 10/22 complicated by sepsis and splenic rupture needing splenectomy which resulted in a lengthy admission, LUE DVT, TIA,  current tobacco use and chronic heart failure.  Has not been admitted or been in the ED in the last 6 months  Echo 2015: EF 45-50%. Echo 01/26/15: EF of 25-30% along with trivial Travis/ TR. Echo 08/08/16: EF of 35-40%. Echo 06/04/19: EF of 35-40% along with mild LVH. Echo 11/09/19: EF of 45-50% along with mild LVH Echo 01/07/21: EF of 45-50% along with mild Travis. Echo 06/19/22: EF 25-30% with mild LVH, Grade I DD, mild Travis and mild LAE        LHC 01/28/15:  10% stenosis in Prox RCA to Mid RCA and 30% stenosis in Prox LAD to Mid LAD which is not much different from previous angiography.  Cardiomyopathy felt to be due to previous alcohol use.   RHC/ LHC 07/17/22:   Prox LAD to Mid LAD lesion is 60% stenosed.   1st Diag lesion is 40% stenosed.  1. Normal filling pressures and preserved cardiac output.  2. Moderate CAD with 60% calcified stenosis proximal LAD/D1.  I do not think this is flow limiting and does not explain fall in EF.     cMRI 10/11/22: 1.  Normal LV size with diffuse hypokinesis, EF 41%.  2.  Normal RV size with mildly decreased systolic function, EF 49%.  3. Subtle mid-wall LGE in the basal septum, this is nonspecific and often seen with nonischemic cardiomyopathies.  4. Mildly elevated extracellular volume percentage, suggesting increased myocardial fibrosis.  He presents today for a HF f/u visit with a chief complaint of minimal fatigue with moderate exertion. Has associated  intermittent dizziness along with this. Denies shortness of breath, chest pain, cough, palpitations, abdominal distention, pedal edema or weight gain. Is supposed to get lipid panel done next week in [place] and asks if it can be done here today.   ROS: All systems negative except as listed in HPI, PMH and Problem List.  SH:  Social History   Socioeconomic History   Marital status: Single    Spouse name: Not on file   Number of children: 0   Years of education: 12   Highest education level: High school graduate  Occupational History   Occupation: Retired  Tobacco Use   Smoking status: Former    Current packs/day: 0.00    Average packs/day: 0.8 packs/day for 30.0 years (22.5 ttl pk-yrs)    Types: Cigarettes    Start date: 12/1990    Quit date: 12/2020    Years since quitting: 2.0   Smokeless tobacco: Never  Vaping Use   Vaping status: Never Used  Substance and Sexual Activity   Alcohol use: Not Currently   Drug use: Not Currently   Sexual activity: Yes    Birth control/protection: Condom  Other Topics Concern   Not on file  Social History Narrative   Not on file   Social Determinants of Health   Financial Resource Strain: Low Risk  (08/02/2022)   Overall Financial Resource Strain (CARDIA)    Difficulty of Paying Living Expenses: Not hard at all  Food Insecurity: No Food Insecurity (08/02/2022)   Hunger Vital Sign    Worried About Running Out of Food in the Last Year: Never true    Ran Out of Food in the Last Year: Never true  Transportation Needs: No Transportation Needs (08/02/2022)   PRAPARE - Administrator, Civil Service (Medical): No    Lack of Transportation (Non-Medical): No  Physical Activity: Insufficiently Active (08/02/2022)   Exercise Vital Sign    Days of Exercise per Week: 7 days    Minutes of Exercise per Session: 20 min  Stress: No Stress Concern Present (08/02/2022)   [employer] of Occupational Health - Occupational Stress Questionnaire     Feeling of Stress : Not at all  Social Connections: Moderately Isolated (08/02/2022)   Social Connection and Isolation Panel [NHANES]    Frequency of Communication with Friends and Family: More than three times a week    Frequency of Social Gatherings with Friends and Family: More than three times a week    Attends Religious Services: More than 4 times per year    Active Member of [employer] or Organizations: No    Attends Banker Meetings: Never    Marital Status: Never married  Intimate Partner Violence: Not At Risk (08/02/2022)   Humiliation, Afraid, Rape, and Kick questionnaire    Fear of Current or Ex-Partner: No    Emotionally Abused: No    Physically Abused: No    Sexually Abused: No    FH:  Family History  Problem Relation Age of Onset   Diabetes Mother    Hypertension Mother    Diabetes Father    Hypertension Father    Diabetes Sister    Dementia Brother    Cancer Neg Hx    Heart disease Neg Hx    Stroke Neg Hx    Colon cancer Neg Hx    Esophageal cancer Neg Hx    Rectal cancer Neg Hx    Stomach cancer Neg Hx     Past Medical History:  Diagnosis Date   Alcohol abuse    Alcoholic cardiomyopathy (HCC) 65/78/4696   a. 05/2019 Echo: EF 35-40%; b. 10/2019 Echo: EF 45-50%.   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 12/2010 Echo: EF 20-25%; b 08/2013 Echo: EF 45-50%; c. 01/2015 Echo: EF 25-30%; d. 07/2016 Echo: EF 35-40%; e. 05/2019 Echo: EF 35-40%; d. 10/2019 Echo: EF 45-50%, mild LVH, mild red RV fxn, mildl dil RA, Triv Travis. Mild to mod Ao Sclerosis w/o stenosis.   Hyperlipidemia    Hypertension    Nonischemic cardiomyopathy (HCC)    Nonobstructive Coronary artery disease     a. 01/2015 Cath: LM nl, LAD 30p/m, LCX nl, RCA 10p/m, RPDA min irregs.   Osteoarthritis    Pneumonia    TIA (transient ischemic attack)    Tobacco abuse    Vision loss    peripherial vision only left eye.Central Retinal  artery occusion     Current Outpatient Medications  Medication Sig Dispense Refill   acetaminophen (TYLENOL) 500 MG tablet  Take 2 tablets (1,000 mg total) by mouth every 8 (eight) hours as needed. 30 tablet 0   albuterol (VENTOLIN HFA) 108 (90 Base) MCG/ACT inhaler Inhale 2 puffs into the lungs every 6 (six) hours as needed for wheezing or shortness of breath.     allopurinol (ZYLOPRIM) 100 MG tablet TAKE ONE TABLET BY MOUTH TWICE A DAY 180 tablet 1   aspirin (ASPIRIN CHILDRENS) 81 MG chewable tablet Chew 1 tablet (81 mg total) by mouth daily. 36 tablet 11   atorvastatin (LIPITOR) 80 MG tablet Take 1 tablet (80 mg total) by mouth daily. 90 tablet 3   Blood Pressure KIT Check b/p once daily. Based on insurance preference 1 kit 0   carvedilol (COREG) 6.25 MG tablet TAKE ONE TABLET BY MOUTH TWICE A DAY 180 tablet 3   cetirizine (ZYRTEC) 10 MG tablet Take 1 tablet (10 mg total) by mouth daily. 90 tablet 3   diphenhydrAMINE (BENADRYL) 25 MG tablet Take 25 mg by mouth daily as needed for allergies.     ezetimibe (ZETIA) 10 MG tablet Take 1 tablet (10 mg total) by mouth daily. 30 tablet 11   fenofibrate (TRICOR) 145 MG tablet TAKE ONE TABLET BY MOUTH ONCE A DAY 90 tablet 1   ferrous sulfate 325 (65 FE) MG tablet Take 1 tablet (325 mg total) by mouth daily with breakfast. 90 tablet 2   fluticasone (FLONASE) 50 MCG/ACT nasal spray Place 1-2 sprays into both nostrils daily as needed for allergies or rhinitis.     glimepiride (AMARYL) 1 MG tablet TAKE ONE TABLET BY MOUTH EVERY MORNING WITH BREAKFAST 30 tablet 0   hydroxypropyl methylcellulose / hypromellose (ISOPTO TEARS / GONIOVISC) 2.5 % ophthalmic solution Place 1 drop into both eyes 2 (two) times daily.     magnesium oxide  (MAG-OX) 400 MG tablet Take 1 tablet (400 mg total) by mouth daily. 90 tablet 1   metFORMIN (GLUCOPHAGE) 500 MG tablet TAKE TWO TABLETS BY MOUTH TWICE A DAY WITH A MEAL 360 tablet 1   multivitamin (ONE-A-DAY MEN'S) TABS tablet Take 1 tablet by mouth daily with breakfast.     Omega-3 Fatty Acids (FISH OIL PO) Take 1 capsule by mouth daily.     pantoprazole (PROTONIX) 40 MG tablet TAKE ONE TABLET BY MOUTH ONCE A DAY 90 tablet 1   predniSONE (DELTASONE) 10 MG tablet      sacubitril-valsartan (ENTRESTO) 49-51 MG Take 1 tablet by mouth 2 (two) times daily. 60 tablet 5   spironolactone (ALDACTONE) 25 MG tablet Take 1 tablet (25 mg total) by mouth daily. 90 tablet 3   triamcinolone cream (KENALOG) 0.5 % Apply 1 Application topically 2 (two) times daily. For 7 days 30 g 0   No current facility-administered medications for this visit.   Vitals:   01/02/23 0926  BP: (!) 145/78  Pulse: 70  SpO2: 100%  Weight: 194 lb (88 kg)   Wt Readings from Last 3 Encounters:  01/02/23 194 lb (88 kg)  12/19/22 196 lb 6.4 oz (89.1 kg)  11/23/22 194 lb (88 kg)   Lab Results  Component Value Date   CREATININE 1.28 12/19/2022   CREATININE 1.35 (H) 11/23/2022   CREATININE 1.35 (H) 11/12/2022   PHYSICAL EXAM:  General:  Well appearing. No resp difficulty HEENT: normal Neck: supple. JVP flat. No lymphadenopathy or thryomegaly appreciated. Cor: PMI normal. Regular rate & rhythm. No rubs, gallops or murmurs. Lungs: clear Abdomen: soft, nontender, nondistended. No hepatosplenomegaly. No bruits or masses.  Extremities:  no cyanosis, clubbing, rash, edema Neuro: alert & oriented x3, cranial nerves grossly intact. Moves all 4 extremities w/o difficulty. Affect pleasant.   ECG: not done   ASSESSMENT & PLAN:  1: NICM with reduced ejection fraction- - suspect due to previous heavy alcohol use as cath showed nonobstructive CAD - NYHA class II - euvolemic today - weighing daily; reminded to call for an  overnight weight gain of >2 pounds/weekly weight gain of >5 pounds.  - weight unchanged from last visit here 5 weeks ago - Echo 2015: EF 45-50%. - Echo 01/26/15: EF of 25-30% along with trivial Travis/ TR. - Echo 08/08/16: EF of 35-40%. - Echo 06/04/19: EF of 35-40% along with mild LVH. - Echo 11/09/19: EF of 45-50% along with mild LVH - Echo 01/07/21: EF of 45-50% along with mild Travis. - Echo 06/19/22: EF 25-30% with mild LVH, Grade I DD, mild Travis and mild LAE     - cMRI 10/11/22: 1.  Normal LV size with diffuse hypokinesis, EF 41%.  2.  Normal RV size with mildly decreased systolic function, EF 49%.  3. Subtle mid-wall LGE in the basal septum, this is nonspecific and often seen with nonischemic cardiomyopathies.  4. Mildly elevated extracellular volume percentage, suggesting increased myocardial fibrosis. - EF out of range for ICD on 7/24 cMRI. Narrow QRS so not candidate for CRT. - sometimes cooks with salt - Drinking approximately 64 oz of fluid daily - increase carvedilol to 12.5mg  BID; finish current bottle by taking 2 tablets BID and then he will call for the 12.5mg  RX to be sent in - continue spironolactone 25mg  daily (refilled today) - continue entresto 49/51mg  BID; consider titration at next visit - tried farxiga in the past but he felt terrible on it - will need slow and careful GDMT titration with history of dehydration and orthostasis.  - palliative care visit done 11/06/21 - BNP 09/19/22 was 26.2  2: HTN- - BP 145/78 - saw PCP Travis [name]) 09/24 - BMP 12/19/22 reviewed and shows sodium 138, potassium 4.4, creatinine 1.28 and GFR 58.15 - BMP today - saw nephrology Travis [name]) 09/22  3: Hyperlipidemia- - continues to not smoke - LDL 11/12/22 was 80 - lipid panel today - continue atorvastatin 80mg  daily - continue zetia 10mg  daily - continue fenofibrate 145mg  daily  4: DM- - checks blood glucose twice a week - A1c 08/16/22 was 6.7% - continue glimepiride 1mg  daily - continue  metformin 500mg  BID  5: CAD- - saw cardiology Travis [name]) 05/24 - continue ASA 81mg  daily - continue atorvastatin 80mg  daily RHC/ LHC 07/17/22:   Prox LAD to Mid LAD lesion is 60% stenosed.   1st Diag lesion is 40% stenosed.  1. Normal filling pressures and preserved cardiac output.  2. Moderate CAD with 60% calcified stenosis proximal LAD/D1.  I do not think this is flow limiting and does not explain fall in EF.     Return in 1 month, sooner if needed.

## 2023-01-01 NOTE — Progress Notes (Deleted)
PCP: Mordecai Maes, NP (last seen 09/24) Primary Cardiologist: Riccardo Dubin, MD (last seen 05/24) HF MD: Marca Ancona, MD (last seen 08/24)  HPI:  Travis [name] is a 67 y/o male with a history of osteoarthritis, hyperlipidemia, gout, HTN, GI bleed, type 2 DM, CAD, anemia, asthma, previous alcohol use (quit 2022), central retinal artery occlusion left eye (2015), CKD, appendicitis 10/22 complicated by sepsis and splenic rupture needing splenectomy which resulted in a lengthy admission, LUE DVT, TIA,  current tobacco use and chronic heart failure.  Has not been admitted or been in the ED in the last 6 months  Echo 2015: EF 45-50%. Echo 01/26/15: EF of 25-30% along with trivial Travis/ TR. Echo 08/08/16: EF of 35-40%. Echo 06/04/19: EF of 35-40% along with mild LVH. Echo 11/09/19: EF of 45-50% along with mild LVH Echo 01/07/21: EF of 45-50% along with mild Travis. Echo 06/19/22: EF 25-30% with mild LVH, Grade I DD, mild Travis and mild LAE        LHC 01/28/15:  10% stenosis in Prox RCA to Mid RCA and 30% stenosis in Prox LAD to Mid LAD which is not much different from previous angiography.  Cardiomyopathy felt to be due to previous alcohol use.   RHC/ LHC 07/17/22:   Prox LAD to Mid LAD lesion is 60% stenosed.   1st Diag lesion is 40% stenosed.  1. Normal filling pressures and preserved cardiac output.  2. Moderate CAD with 60% calcified stenosis proximal LAD/D1.  I do not think this is flow limiting and does not explain fall in EF.     cMRI 10/11/22: 1.  Normal LV size with diffuse hypokinesis, EF 41%.  2.  Normal RV size with mildly decreased systolic function, EF 49%.  3. Subtle mid-wall LGE in the basal septum, this is nonspecific and often seen with nonischemic cardiomyopathies.  4. Mildly elevated extracellular volume percentage, suggesting increased myocardial fibrosis.  He presents today for a HF f/u visit with a chief complaint of intermittent dizziness. Says it tends to occur if he changes  position too quickly. Denies fatigue, SOB, chest pain, cough, palpitations, abdominal distention, pedal edema, weight gain or difficulty sleeping.   Endorses frustration with the quantity of pills that he has to take. He understands that if he needs to take them all, he will but asks that his meds be looked at. Reviewed his med list and that each disease state needs multiple meds to address the issues.   ROS: All systems negative except as listed in HPI, PMH and Problem List.  SH:  Social History   Socioeconomic History   Marital status: Single    Spouse name: Not on file   Number of children: 0   Years of education: 12   Highest education level: High school graduate  Occupational History   Occupation: Retired  Tobacco Use   Smoking status: Former    Current packs/day: 0.00    Average packs/day: 0.8 packs/day for 30.0 years (22.5 ttl pk-yrs)    Types: Cigarettes    Start date: 12/1990    Quit date: 12/2020    Years since quitting: 2.0   Smokeless tobacco: Never  Vaping Use   Vaping status: Never Used  Substance and Sexual Activity   Alcohol use: Not Currently   Drug use: Not Currently   Sexual activity: Yes    Birth control/protection: Condom  Other Topics Concern   Not on file  Social History Narrative   Not on file   Social  Determinants of Health   Financial Resource Strain: Low Risk  (08/02/2022)   Overall Financial Resource Strain (CARDIA)    Difficulty of Paying Living Expenses: Not hard at all  Food Insecurity: No Food Insecurity (08/02/2022)   Hunger Vital Sign    Worried About Running Out of Food in the Last Year: Never true    Ran Out of Food in the Last Year: Never true  Transportation Needs: No Transportation Needs (08/02/2022)   PRAPARE - Administrator, Civil Service (Medical): No    Lack of Transportation (Non-Medical): No  Physical Activity: Insufficiently Active (08/02/2022)   Exercise Vital Sign    Days of Exercise per Week: 7 days    Minutes  of Exercise per Session: 20 min  Stress: No Stress Concern Present (08/02/2022)   [employer] of Occupational Health - Occupational Stress Questionnaire    Feeling of Stress : Not at all  Social Connections: Moderately Isolated (08/02/2022)   Social Connection and Isolation Panel [NHANES]    Frequency of Communication with Friends and Family: More than three times a week    Frequency of Social Gatherings with Friends and Family: More than three times a week    Attends Religious Services: More than 4 times per year    Active Member of [employer] or Organizations: No    Attends Banker Meetings: Never    Marital Status: Never married  Intimate Partner Violence: Not At Risk (08/02/2022)   Humiliation, Afraid, Rape, and Kick questionnaire    Fear of Current or Ex-Partner: No    Emotionally Abused: No    Physically Abused: No    Sexually Abused: No    FH:  Family History  Problem Relation Age of Onset   Diabetes Mother    Hypertension Mother    Diabetes Father    Hypertension Father    Diabetes Sister    Dementia Brother    Cancer Neg Hx    Heart disease Neg Hx    Stroke Neg Hx    Colon cancer Neg Hx    Esophageal cancer Neg Hx    Rectal cancer Neg Hx    Stomach cancer Neg Hx     Past Medical History:  Diagnosis Date   Alcohol abuse    Alcoholic cardiomyopathy (HCC) 16/12/9602   a. 05/2019 Echo: EF 35-40%; b. 10/2019 Echo: EF 45-50%.   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 12/2010 Echo: EF 20-25%; b 08/2013 Echo: EF 45-50%; c. 01/2015 Echo: EF 25-30%; d. 07/2016 Echo: EF 35-40%; e. 05/2019 Echo: EF 35-40%; d. 10/2019 Echo: EF 45-50%, mild LVH, mild red RV fxn, mildl dil RA,  Triv Travis. Mild to mod Ao Sclerosis w/o stenosis.   Hyperlipidemia    Hypertension    Nonischemic cardiomyopathy (HCC)    Nonobstructive Coronary artery disease    a. 01/2015 Cath: LM nl, LAD 30p/m, LCX nl, RCA 10p/m, RPDA min irregs.   Osteoarthritis    Pneumonia    TIA (transient ischemic attack)    Tobacco abuse    Vision  loss    peripherial vision only left eye.Central Retinal  artery occusion     Current Outpatient Medications  Medication Sig Dispense Refill   acetaminophen (TYLENOL) 500 MG tablet Take 2 tablets (1,000 mg total) by mouth every 8 (eight) hours as needed. 30 tablet 0   albuterol (VENTOLIN HFA) 108 (90 Base) MCG/ACT inhaler Inhale 2 puffs into the lungs every 6 (six) hours as needed for wheezing or shortness of breath.     allopurinol (ZYLOPRIM) 100 MG tablet TAKE ONE TABLET BY MOUTH TWICE A DAY 180 tablet 1   aspirin (ASPIRIN CHILDRENS) 81 MG chewable tablet Chew 1 tablet (81 mg total) by mouth daily. 36 tablet 11   atorvastatin (LIPITOR) 80 MG tablet Take 1 tablet (80 mg total) by mouth daily. 90 tablet 3   Blood Pressure KIT Check b/p once daily. Based on insurance preference 1 kit 0   carvedilol (COREG) 6.25 MG tablet TAKE ONE TABLET BY MOUTH TWICE A DAY 180 tablet 3   cetirizine (ZYRTEC) 10 MG tablet Take 1 tablet (10 mg total) by mouth daily. 90 tablet 3   diphenhydrAMINE (BENADRYL) 25 MG tablet Take 25 mg by mouth daily as needed for allergies.     ezetimibe (ZETIA) 10 MG tablet Take 1 tablet (10 mg total) by mouth daily. 30 tablet 11   fenofibrate (TRICOR) 145 MG tablet TAKE ONE TABLET BY MOUTH ONCE A DAY 90 tablet 1   ferrous sulfate 325 (65 FE) MG tablet Take 1 tablet (325 mg total) by mouth daily with breakfast. 90 tablet 2   fluticasone (FLONASE) 50 MCG/ACT nasal spray Place 1-2 sprays into both nostrils daily as needed for allergies or rhinitis.     glimepiride (AMARYL) 1 MG tablet TAKE ONE TABLET BY MOUTH EVERY MORNING WITH BREAKFAST 30 tablet 0    hydroxypropyl methylcellulose / hypromellose (ISOPTO TEARS / GONIOVISC) 2.5 % ophthalmic solution Place 1 drop into both eyes 2 (two) times daily.     magnesium oxide (MAG-OX) 400 MG tablet Take 1 tablet (400 mg total) by mouth daily. 90 tablet 1   metFORMIN (GLUCOPHAGE) 500 MG tablet TAKE TWO TABLETS BY MOUTH TWICE A DAY WITH A MEAL 360 tablet 1   multivitamin (ONE-A-DAY MEN'S) TABS tablet Take 1 tablet by mouth daily with breakfast.     Omega-3 Fatty Acids (FISH OIL PO) Take 1 capsule by mouth daily.     pantoprazole (PROTONIX) 40 MG tablet TAKE ONE TABLET BY MOUTH ONCE A DAY 90 tablet 1   predniSONE (DELTASONE) 10 MG tablet      sacubitril-valsartan (ENTRESTO) 49-51 MG Take 1 tablet by mouth 2 (two) times daily. 60 tablet 5   spironolactone (ALDACTONE) 25 MG tablet Take 1 tablet (25 mg total) by mouth daily. 90 tablet 3   triamcinolone cream (KENALOG) 0.5 % Apply 1 Application topically 2 (two) times daily. For 7 days 30 g 0   No current facility-administered medications for this visit.      PHYSICAL EXAM:  General:  Well appearing. No resp difficulty HEENT: normal Neck: supple. JVP flat. No lymphadenopathy or thryomegaly appreciated. Cor: PMI normal. Regular rate & rhythm. No rubs, gallops or murmurs. Lungs: clear Abdomen: soft, nontender, nondistended. No hepatosplenomegaly. No bruits or masses.  Extremities: no cyanosis, clubbing, rash, edema Neuro: alert & orientedx3, cranial nerves grossly intact. Moves all 4 extremities w/o difficulty. Affect pleasant.   ECG: not done   ASSESSMENT & PLAN:  1: NICM with reduced ejection fraction- - suspect due  to previous heavy alcohol use as cath showed nonobstructive CAD - NYHA class I - euvolemic today - weighing daily; reminded to call for an overnight weight gain of >2 pounds/weekly weight gain of >5 pounds.  - weight 194 from last visit here 1 month ago - Echo 2015: EF 45-50%. - Echo 01/26/15: EF of 25-30% along with trivial Travis/  TR. - Echo 08/08/16: EF of 35-40%. - Echo 06/04/19: EF of 35-40% along with mild LVH. - Echo 11/09/19: EF of 45-50% along with mild LVH - Echo 01/07/21: EF of 45-50% along with mild Travis. - Echo 06/19/22: EF 25-30% with mild LVH, Grade I DD, mild Travis and mild LAE     - cMRI 10/11/22: 1.  Normal LV size with diffuse hypokinesis, EF 41%.  2.  Normal RV size with mildly decreased systolic function, EF 49%.  3. Subtle mid-wall LGE in the basal septum, this is nonspecific and often seen with nonischemic cardiomyopathies.  4. Mildly elevated extracellular volume percentage, suggesting increased myocardial fibrosis. - EF out of range for ICD on 7/24 cMRI. Narrow QRS so not candidate for CRT. - sometimes cooks with salt - Drinking approximately 64 oz of fluid daily - continue carvedilol 6.25mg  BID - continue spironolactone 25mg  daily - continue entresto 49/51mg  BID - tried farxiga in the past but he felt terrible on it - will need slow and careful GDMT titration with history of dehydration and orthostasis.  - palliative care visit done 11/06/21 - BNP 09/19/22 was 26.2  2: HTN- - BP  - saw PCP Travis [name]) 09/24 - BMP 12/19/22 reviewed and shows sodium 138, potassium 4.4, creatinine 1.28 and GFR 58.15 - saw nephrology Travis [name]) 09/22  3: Hyperlipidemia- - continues to not smoke - LDL 11/12/22 was 80 - continue atorvastatin 80mg  daily - continue zetia 10mg  daily - continue fenofibrate 145mg  daily  4: DM- - checks blood glucose twice a week - A1c 08/16/22 was 6.7% - continue glimepiride 1mg  daily - continue metformin 500mg  BID  5: CAD- - saw cardiology Travis [name]) 05/24 - continue ASA 81mg  daily - continue atorvastatin 80mg  daily RHC/ LHC 07/17/22:   Prox LAD to Mid LAD lesion is 60% stenosed.   1st Diag lesion is 40% stenosed.  1. Normal filling pressures and preserved cardiac output.  2. Moderate CAD with 60% calcified stenosis proximal LAD/D1.  I do not think this is flow limiting and does not  explain fall in EF.

## 2023-01-01 NOTE — Telephone Encounter (Signed)
Patient did not show for his [facility] appointment on 01/01/23

## 2023-01-02 ENCOUNTER — Encounter: Payer: Self-pay | Admitting: Family

## 2023-01-02 ENCOUNTER — Telehealth: Payer: Self-pay

## 2023-01-02 ENCOUNTER — Other Ambulatory Visit
Admission: RE | Admit: 2023-01-02 | Discharge: 2023-01-02 | Disposition: A | Discharge status: Home / Self Care | Payer: 59 | Source: Ambulatory Visit | Attending: Family | Admitting: Family

## 2023-01-02 ENCOUNTER — Ambulatory Visit (HOSPITAL_BASED_OUTPATIENT_CLINIC_OR_DEPARTMENT_OTHER): Payer: 59 | Admitting: Family

## 2023-01-02 VITALS — BP 145/78 | HR 70 | Wt 194.0 lb

## 2023-01-02 DIAGNOSIS — E1122 Type 2 diabetes mellitus with diabetic chronic kidney disease: Secondary | ICD-10-CM

## 2023-01-02 DIAGNOSIS — I2510 Atherosclerotic heart disease of native coronary artery without angina pectoris: Secondary | ICD-10-CM | POA: Diagnosis not present

## 2023-01-02 DIAGNOSIS — E782 Mixed hyperlipidemia: Secondary | ICD-10-CM

## 2023-01-02 DIAGNOSIS — I5022 Chronic systolic (congestive) heart failure: Secondary | ICD-10-CM

## 2023-01-02 DIAGNOSIS — I10 Essential (primary) hypertension: Secondary | ICD-10-CM | POA: Diagnosis not present

## 2023-01-02 DIAGNOSIS — E785 Hyperlipidemia, unspecified: Secondary | ICD-10-CM

## 2023-01-02 DIAGNOSIS — N1831 Chronic kidney disease, stage 3a: Secondary | ICD-10-CM | POA: Diagnosis not present

## 2023-01-02 LAB — LIPID PANEL
Cholesterol: 129 mg/dL (ref 0–200)
HDL: 35 mg/dL — ABNORMAL LOW (ref >40)
LDL Cholesterol: 71 mg/dL (ref 0–99)
Total CHOL/HDL Ratio: 3.7 {ratio}
Triglycerides: 114 mg/dL (ref <150)
VLDL: 23 mg/dL (ref 0–40)

## 2023-01-02 LAB — BASIC METABOLIC PANEL
Anion gap: 11 (ref 5–15)
BUN: 20 mg/dL (ref 8–23)
CO2: 25 mmol/L (ref 22–32)
Calcium: 9.8 mg/dL (ref 8.9–10.3)
Chloride: 100 mmol/L (ref 98–111)
Creatinine, Ser: 1.24 mg/dL (ref 0.61–1.24)
GFR, Estimated: >60 mL/min (ref >60)
Glucose, Bld: 120 mg/dL — ABNORMAL HIGH (ref 70–99)
Potassium: 4.2 mmol/L (ref 3.5–5.1)
Sodium: 136 mmol/L (ref 135–145)

## 2023-01-02 MED ORDER — SPIRONOLACTONE 25 MG PO TABS: 25.0000 mg | ORAL_TABLET | Freq: Every day | ORAL | 3 refills | Status: DC

## 2023-01-02 NOTE — Patient Instructions (Signed)
Finish current Carvedilol (Coreg) by taking 2 pills TWICE DAILY

## 2023-01-02 NOTE — Telephone Encounter (Signed)
-----   Message from Delma Freeze sent at 01/02/2023  3:17 PM EDT ----- LDL is still higher than we'd like it to be. Other labs look fine. Needs referral to the lipid clinic in [facility] for possible repatha injection.

## 2023-01-07 ENCOUNTER — Other Ambulatory Visit: Payer: Self-pay | Admitting: Nurse Practitioner

## 2023-01-09 ENCOUNTER — Other Ambulatory Visit (HOSPITAL_COMMUNITY): Payer: 59

## 2023-01-21 ENCOUNTER — Ambulatory Visit: Payer: 59 | Attending: Cardiology | Admitting: Student

## 2023-01-21 ENCOUNTER — Telehealth: Payer: Self-pay | Admitting: Pharmacy Technician

## 2023-01-21 ENCOUNTER — Other Ambulatory Visit: Payer: Self-pay | Admitting: Nurse Practitioner

## 2023-01-21 ENCOUNTER — Telehealth: Payer: Self-pay | Admitting: Pharmacist

## 2023-01-21 ENCOUNTER — Other Ambulatory Visit (HOSPITAL_COMMUNITY): Payer: Self-pay

## 2023-01-21 DIAGNOSIS — E782 Mixed hyperlipidemia: Secondary | ICD-10-CM

## 2023-01-21 DIAGNOSIS — E785 Hyperlipidemia, unspecified: Secondary | ICD-10-CM | POA: Diagnosis not present

## 2023-01-21 DIAGNOSIS — E1165 Type 2 diabetes mellitus with hyperglycemia: Secondary | ICD-10-CM

## 2023-01-21 MED ORDER — REPATHA SURECLICK 140 MG/ML ~~LOC~~ SOAJ: 140.0000 mg | SUBCUTANEOUS | 3 refills | Status: DC

## 2023-01-21 NOTE — Telephone Encounter (Signed)
PCSK9i PA requested

## 2023-01-21 NOTE — Progress Notes (Signed)
Patient ID: DILRAJ HERIN                 DOB: [birth year]                    MRN: [id number]      HPI: Travis [name] is a 67 y.o. male patient referred to lipid clinic by Clarisa Kindred, FNP. PMH is significant for NICM, HX DVT,HTN, CHF, central retinal artery stenosis, COPD, Diabetes CKD stage III, EtOH abuse, former tobacco use, hx of gout, HLD   Patient presented today for lipid clinic. Reports he tolerates statins and Zetia well. He follows low salt diet but still enjoys fried food. Encourage him to cut down on fried food and follows heart healthy diet. He doesn't eat out. Other than walking he does not exercise much but stays active around the house  Reviewed options for lowering LDL cholesterol, including PCSK-9 inhibitors, bempedoic acid and inclisiran.  Discussed mechanisms of action, dosing, side effects and potential decreases in LDL cholesterol.  Also reviewed cost information and potential options for patient assistance.  Current Medications: Lipitor 80 mg, Zetia 10 mg, fenofibrate 145 mg  Intolerances: none  Risk Factors: Diabetes, CHF, CAD,central retinal artery stenosis, CKD stage III, EtOH abuse, former tobacco use, hx of gout, HLD LDL goal: <55 TC 129, TG 114, HDL 35, LDL 71 on 01/02/2023  Cr Cl >70 mL/min (based on BMP on 01/02/2023)   Diet: follows low salt diet, planing to cut down on fried food   Exercise: walk one mile a day   Family History:   Relation Problem Comments  Mother (Deceased) Diabetes   Hypertension     Father (Deceased) Diabetes   Hypertension     Sister Diabetes     Brother Dementia     Social History:  Alcohol: none  Smoking: quit 1.5 year ago  Smokes weed- everyday or varies  Labs: Lipid Panel     Component Value Date/Time   CHOL 129 01/02/2023 1006   CHOL 136 11/12/2022 1144   TRIG 114 01/02/2023 1006   HDL 35 (L) 01/02/2023 1006   HDL 36 (L) 11/12/2022 1144   CHOLHDL 3.7 01/02/2023 1006   VLDL 23 01/02/2023 1006   LDLCALC 71  01/02/2023 1006   LDLCALC 80 11/12/2022 1144   LDLDIRECT 78.0 03/03/2021 1301   LABVLDL 20 11/12/2022 1144    Past Medical History:  Diagnosis Date   Alcohol abuse    Alcoholic cardiomyopathy (HCC) 08/65/7846   a. 05/2019 Echo: EF 35-40%; b. 10/2019 Echo: EF 45-50%.   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 12/2010 Echo: EF 20-25%; b 08/2013 Echo: EF 45-50%; c. 01/2015 Echo: EF 25-30%; d. 07/2016 Echo: EF 35-40%; e. 05/2019 Echo: EF 35-40%; d. 10/2019 Echo: EF 45-50%, mild LVH, mild red RV fxn, mildl dil RA, Triv MR. Mild to mod Ao Sclerosis w/o stenosis.   Hyperlipidemia    Hypertension    Nonischemic cardiomyopathy (HCC)    Nonobstructive Coronary artery disease    a. 01/2015 Cath: LM nl, LAD 30p/m,  LCX nl, RCA 10p/m, RPDA min irregs.   Osteoarthritis    Pneumonia    TIA (transient ischemic attack)    Tobacco abuse    Vision loss    peripherial vision only left eye.Central Retinal  artery occusion     Current Outpatient Medications on File Prior to Visit  Medication Sig Dispense Refill   acetaminophen (TYLENOL) 500 MG tablet Take 2 tablets (1,000 mg total) by mouth every 8 (eight) hours as needed. 30 tablet 0   albuterol (VENTOLIN HFA) 108 (90 Base) MCG/ACT inhaler Inhale 2 puffs into the lungs every 6 (six) hours as needed for wheezing or shortness of breath.     allopurinol (ZYLOPRIM) 100 MG tablet TAKE ONE TABLET BY MOUTH TWICE A DAY 180 tablet 1   aspirin (ASPIRIN CHILDRENS) 81 MG chewable tablet Chew 1 tablet (81 mg total) by mouth daily. 36 tablet 11   atorvastatin (LIPITOR) 80 MG tablet Take 1 tablet (80 mg total) by mouth daily. 90 tablet 3   Blood Pressure KIT Check b/p once  daily. Based on insurance preference 1 kit 0   carvedilol (COREG) 6.25 MG tablet TAKE ONE TABLET BY MOUTH TWICE A DAY (Patient taking differently: Take 12.5 mg by mouth 2 (two) times daily. (Will need 12.5mg  RX next refill)) 180 tablet 3   cetirizine (ZYRTEC) 10 MG tablet Take 1 tablet (10 mg total) by mouth daily. 90 tablet 3   diphenhydrAMINE (BENADRYL) 25 MG tablet Take 25 mg by mouth daily as needed for allergies.     ezetimibe (ZETIA) 10 MG tablet Take 1 tablet (10 mg total) by mouth daily. 30 tablet 11   fenofibrate (TRICOR) 145 MG tablet TAKE ONE TABLET BY MOUTH ONCE A DAY 90 tablet 1   ferrous sulfate 325 (65 FE) MG tablet Take 1 tablet (325 mg total) by mouth daily with breakfast. 90 tablet 2   fluticasone (FLONASE) 50 MCG/ACT nasal spray Place 1-2 sprays into both nostrils daily as needed for allergies or rhinitis.     glimepiride (AMARYL) 1 MG tablet TAKE ONE TABLET BY MOUTH EVERY MORNING WITH BREAKFAST 30 tablet 0   hydroxypropyl methylcellulose / hypromellose (ISOPTO TEARS / GONIOVISC) 2.5 % ophthalmic solution Place 1 drop into both eyes 2 (two) times daily.     magnesium oxide (MAG-OX) 400 MG tablet Take 1 tablet (400 mg total) by mouth daily. 90 tablet 1   metFORMIN (GLUCOPHAGE) 500 MG tablet TAKE TWO TABLETS BY MOUTH TWICE A DAY WITH A MEAL 360 tablet 1   multivitamin (ONE-A-DAY MEN'S) TABS tablet Take 1 tablet by mouth daily with breakfast.     Omega-3 Fatty Acids (FISH OIL PO) Take 1 capsule by mouth daily.     pantoprazole (PROTONIX) 40 MG tablet TAKE ONE TABLET BY MOUTH ONCE A DAY 90 tablet 1   predniSONE (DELTASONE) 10 MG tablet      sacubitril-valsartan (ENTRESTO) 49-51 MG Take 1 tablet by mouth 2 (two) times daily. 60 tablet 5   spironolactone (ALDACTONE) 25 MG tablet Take 1 tablet (25 mg total) by mouth daily. 90 tablet 3   triamcinolone cream (KENALOG) 0.5 % Apply 1 Application topically 2 (two) times daily. For 7 days 30 g 0   No current facility-administered  medications on file prior to visit.    Allergies  Allergen Reactions   Farxiga [Dapagliflozin] Other (See Comments)    Reduction in kidney function    Assessment/Plan:  1. Hyperlipidemia -  Problem  Hld (Hyperlipidemia)   Current  Medications: Lipitor 80 mg, Zetia 10 mg, fenofibrate 145 mg  Intolerances: none  Risk Factors: Diabetes, CHF, CAD,central retinal artery stenosis, CKD stage III, EtOH abuse, former tobacco use, hx of gout, HLD LDL goal: <55 TC 129, TG 114, HDL 35, LDL 71 on 01/02/2023  Cr Cl >70 mL/min (based on BMP on 01/02/2023)     HLD (hyperlipidemia) Assessment:  LDL goal: < 55 mg/dl last LDLc 71 mg/dl (84/03/3242) Tolerates Zetia and high intensity statins well without any side effects  Dicussed lipid lab, LDLc, TG goal and future risk of Stroke and MI Discussed next potential options (PCSK-9 inhibitors, bempedoic acid and inclisiran); cost, dosing efficacy, side effects  Diet discussed in details patient is motivated to cut down on fried food and start following heart healthy diet, walks one mile per day  Plan: Continue taking current medications (Lipitor 80 mg daily, Zetia 10 mg daily, fenofibrate 145 mg daily ) Will apply for PA for PCSK9i; will inform patient upon approval (prefers MyChart message) Lipid lab due in 2-3 months after starting PCSK9i    Thank you,  Carmela Hurt, Pharm.D [facility] A Division of [name] [address], [place], [place] [address]  Phone: [phone]; Fax: [phone]

## 2023-01-21 NOTE — Assessment & Plan Note (Signed)
Assessment:  LDL goal: < 55 mg/dl last LDLc 71 mg/dl (16/03/958) Tolerates Zetia and high intensity statins well without any side effects  Dicussed lipid lab, LDLc, TG goal and future risk of Stroke and MI Discussed next potential options (PCSK-9 inhibitors, bempedoic acid and inclisiran); cost, dosing efficacy, side effects  Diet discussed in details patient is motivated to cut down on fried food and start following heart healthy diet, walks one mile per day  Plan: Continue taking current medications (Lipitor 80 mg daily, Zetia 10 mg daily, fenofibrate 145 mg daily ) Will apply for PA for PCSK9i; will inform patient upon approval (prefers MyChart message) Lipid lab due in 2-3 months after starting [facility]

## 2023-01-21 NOTE — Telephone Encounter (Signed)
Pharmacy Patient Advocate Encounter   Received notification from Pt Calls Messages that prior authorization for repatha is required/requested.   Insurance verification completed.   The patient is insured through [facility] .   Per test claim: PA required; PA submitted to [facility] via CoverMyMeds Key/confirmation #/[facility] Status is pending

## 2023-01-21 NOTE — Addendum Note (Signed)
Addended by: Tylene Fantasia on: 01/21/2023 01:10 PM   Modules accepted: Orders

## 2023-01-21 NOTE — Telephone Encounter (Signed)
Pharmacy Patient Advocate Encounter  Received notification from [facility] that Prior Authorization for repatha has been APPROVED from 01/21/23 to 07/22/23. Ran test claim, Copay is $0.00- one month. This test claim was processed through [facility]- copay amounts may vary at other pharmacies due to pharmacy/plan contracts, or as the patient moves through the different stages of their insurance plan.   PA #/Case ID/Reference #: [id number]

## 2023-01-21 NOTE — Telephone Encounter (Signed)
Patient informed about the approval and prescription sent to patient preferred pharmacy. Lab is due on Jan 28,2025

## 2023-01-21 NOTE — Patient Instructions (Signed)
Your Results:             Your most recent labs Goal  Total Cholesterol 129 < 200  Triglycerides 114 < 150  HDL (happy/good cholesterol) 35 > 40  LDL (lousy/bad cholesterol 71 < 55   Medication changes: We will start the process to get PCSK9i( Repatha or Praluent)  covered by your insurance.  Once the prior authorization is complete, we will call you to let you know and confirm pharmacy information.    Praluent is a cholesterol medication that improved your body's ability to get rid of "bad cholesterol" known as LDL. It can lower your LDL up to 60%. It is an injection that is given under the skin every 2 weeks. The most common side effects of Praluent include runny nose, symptoms of the common cold, rarely flu or flu-like symptoms, back/muscle pain in about 3-4% of the patients, and redness, pain, or bruising at the injection site.    Repatha is a cholesterol medication that improved your body's ability to get rid of "bad cholesterol" known as LDL. It can lower your LDL up to 60%! It is an injection that is given under the skin every 2 weeks. The most common side effects of Repatha include runny nose, symptoms of the common cold, rarely flu or flu-like symptoms, back/muscle pain in about 3-4% of the patients, and redness, pain, or bruising at the injection site.   Lab orders: We want to repeat labs after 2-3 months.  We will send you a lab order to remind you once we get closer to that time.

## 2023-02-05 NOTE — Progress Notes (Unsigned)
PCP: Mordecai Maes, NP (last seen 09/24) Primary Cardiologist: Riccardo Dubin, MD (last seen 05/24) HF MD: Marca Ancona, MD (last seen 08/24)  HPI:  Travis [name] is a 68 y/o male with a history of osteoarthritis, hyperlipidemia, gout, HTN, GI bleed, type 2 DM, CAD, anemia, asthma, previous alcohol use (quit 2022), central retinal artery occlusion left eye (2015), CKD, appendicitis 10/22 complicated by sepsis and splenic rupture needing splenectomy which resulted in a lengthy admission, LUE DVT, TIA, current tobacco use and chronic heart failure.  Has not been admitted or been in the ED in the last 6 months  Echo 2015: EF 45-50%. Echo 01/26/15: EF of 25-30% along with trivial Travis/ TR. Echo 08/08/16: EF of 35-40%. Echo 06/04/19: EF of 35-40% along with mild LVH. Echo 11/09/19: EF of 45-50% along with mild LVH Echo 01/07/21: EF of 45-50% along with mild Travis. Echo 06/19/22: EF 25-30% with mild LVH, Grade I DD, mild Travis and mild LAE        LHC 01/28/15:  10% stenosis in Prox RCA to Mid RCA and 30% stenosis in Prox LAD to Mid LAD which is not much different from previous angiography.  Cardiomyopathy felt to be due to previous alcohol use.   RHC/ LHC 07/17/22:   Prox LAD to Mid LAD lesion is 60% stenosed.   1st Diag lesion is 40% stenosed.  1. Normal filling pressures and preserved cardiac output.  2. Moderate CAD with 60% calcified stenosis proximal LAD/D1.  I do not think this is flow limiting and does not explain fall in EF.     cMRI 10/11/22: 1.  Normal LV size with diffuse hypokinesis, EF 41%.  2.  Normal RV size with mildly decreased systolic function, EF 49%.  3. Subtle mid-wall LGE in the basal septum, this is nonspecific and often seen with nonischemic cardiomyopathies.  4. Mildly elevated extracellular volume percentage, suggesting increased myocardial fibrosis.  He presents today for a HF f/u visit with a chief complaint of minimal fatigue with moderate exertion. Chronic in nature. Has  occasional dizziness along with this. Denies shortness of breath, chest pain, cough, palpitations, abdominal distention, pedal edema, weight gain or difficulty sleeping.   At last visit, carvedilol was increased to 12.5mg  BID and he doesn't notice any issues with taking the increased dose. Experiences occasional dizziness but nothing consistent. Checking his BP at home and getting 130's/80's  ROS: All systems negative except as listed in HPI, PMH and Problem List.  SH:  Social History   Socioeconomic History   Marital status: Single    Spouse name: Not on file   Number of children: 0   Years of education: 12   Highest education level: High school graduate  Occupational History   Occupation: Retired  Tobacco Use   Smoking status: Former    Current packs/day: 0.00    Average packs/day: 0.8 packs/day for 30.0 years (22.5 ttl pk-yrs)    Types: Cigarettes    Start date: 12/1990    Quit date: 12/2020    Years since quitting: 2.1   Smokeless tobacco: Never  Vaping Use   Vaping status: Never Used  Substance and Sexual Activity   Alcohol use: Not Currently   Drug use: Not Currently   Sexual activity: Yes    Birth control/protection: Condom  Other Topics Concern   Not on file  Social History Narrative   Not on file   Social Determinants of Health   Financial Resource Strain: Low Risk  (08/02/2022)  Overall Financial Resource Strain (CARDIA)    Difficulty of Paying Living Expenses: Not hard at all  Food Insecurity: No Food Insecurity (08/02/2022)   Hunger Vital Sign    Worried About Running Out of Food in the Last Year: Never true    Ran Out of Food in the Last Year: Never true  Transportation Needs: No Transportation Needs (08/02/2022)   PRAPARE - Administrator, Civil Service (Medical): No    Lack of Transportation (Non-Medical): No  Physical Activity: Insufficiently Active (08/02/2022)   Exercise Vital Sign    Days of Exercise per Week: 7 days    Minutes of Exercise  per Session: 20 min  Stress: No Stress Concern Present (08/02/2022)   [employer] of Occupational Health - Occupational Stress Questionnaire    Feeling of Stress : Not at all  Social Connections: Moderately Isolated (08/02/2022)   Social Connection and Isolation Panel [NHANES]    Frequency of Communication with Friends and Family: More than three times a week    Frequency of Social Gatherings with Friends and Family: More than three times a week    Attends Religious Services: More than 4 times per year    Active Member of [employer] or Organizations: No    Attends Banker Meetings: Never    Marital Status: Never married  Intimate Partner Violence: Not At Risk (08/02/2022)   Humiliation, Afraid, Rape, and Kick questionnaire    Fear of Current or Ex-Partner: No    Emotionally Abused: No    Physically Abused: No    Sexually Abused: No    FH:  Family History  Problem Relation Age of Onset   Diabetes Mother    Hypertension Mother    Diabetes Father    Hypertension Father    Diabetes Sister    Dementia Brother    Cancer Neg Hx    Heart disease Neg Hx    Stroke Neg Hx    Colon cancer Neg Hx    Esophageal cancer Neg Hx    Rectal cancer Neg Hx    Stomach cancer Neg Hx     Past Medical History:  Diagnosis Date   Alcohol abuse    Alcoholic cardiomyopathy (HCC) 16/12/9602   a. 05/2019 Echo: EF 35-40%; b. 10/2019 Echo: EF 45-50%.   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 12/2010 Echo: EF 20-25%; b 08/2013 Echo: EF 45-50%; c. 01/2015 Echo: EF 25-30%; d. 07/2016 Echo: EF 35-40%; e. 05/2019 Echo: EF 35-40%; d. 10/2019 Echo: EF 45-50%, mild LVH, mild red RV fxn, mildl dil RA, Triv Travis. Mild  to mod Ao Sclerosis w/o stenosis.   Hyperlipidemia    Hypertension    Nonischemic cardiomyopathy (HCC)    Nonobstructive Coronary artery disease    a. 01/2015 Cath: LM nl, LAD 30p/m, LCX nl, RCA 10p/m, RPDA min irregs.   Osteoarthritis    Pneumonia    TIA (transient ischemic attack)    Tobacco abuse    Vision loss    peripherial vision only left eye.Central Retinal  artery occusion  Current Outpatient Medications  Medication Sig Dispense Refill   acetaminophen (TYLENOL) 500 MG tablet Take 2 tablets (1,000 mg total) by mouth every 8 (eight) hours as needed. 30 tablet 0   albuterol (VENTOLIN HFA) 108 (90 Base) MCG/ACT inhaler Inhale 2 puffs into the lungs every 6 (six) hours as needed for wheezing or shortness of breath.     allopurinol (ZYLOPRIM) 100 MG tablet TAKE ONE TABLET BY MOUTH TWICE A DAY 180 tablet 1   aspirin (ASPIRIN CHILDRENS) 81 MG chewable tablet Chew 1 tablet (81 mg total) by mouth daily. 36 tablet 11   atorvastatin (LIPITOR) 80 MG tablet Take 1 tablet (80 mg total) by mouth daily. 90 tablet 3   Blood Pressure KIT Check b/p once daily. Based on insurance preference 1 kit 0   carvedilol (COREG) 6.25 MG tablet TAKE ONE TABLET BY MOUTH TWICE A DAY (Patient taking differently: Take 12.5 mg by mouth 2 (two) times daily. (Will need 12.5mg  RX next refill)) 180 tablet 3   cetirizine (ZYRTEC) 10 MG tablet Take 1 tablet (10 mg total) by mouth daily. 90 tablet 3   diphenhydrAMINE (BENADRYL) 25 MG tablet Take 25 mg by mouth daily as needed for allergies.     Evolocumab (REPATHA SURECLICK) 140 MG/ML SOAJ Inject 140 mg into the skin every 14 (fourteen) days. 6 mL 3   ezetimibe (ZETIA) 10 MG tablet Take 1 tablet (10 mg total) by mouth daily. 30 tablet 11   fenofibrate (TRICOR) 145 MG tablet TAKE ONE TABLET BY MOUTH ONCE A DAY 90 tablet 1   ferrous sulfate 325 (65 FE) MG tablet Take 1 tablet (325 mg total) by mouth daily with breakfast. 90 tablet 2   fluticasone (FLONASE) 50 MCG/ACT  nasal spray Place 1-2 sprays into both nostrils daily as needed for allergies or rhinitis.     glimepiride (AMARYL) 1 MG tablet TAKE ONE TABLET BY MOUTH EVERY MORNING WITH BREAKFAST 90 tablet 1   hydroxypropyl methylcellulose / hypromellose (ISOPTO TEARS / GONIOVISC) 2.5 % ophthalmic solution Place 1 drop into both eyes 2 (two) times daily.     magnesium oxide (MAG-OX) 400 MG tablet Take 1 tablet (400 mg total) by mouth daily. 90 tablet 1   metFORMIN (GLUCOPHAGE) 500 MG tablet TAKE TWO TABLETS BY MOUTH TWICE A DAY WITH A MEAL 360 tablet 1   multivitamin (ONE-A-DAY MEN'S) TABS tablet Take 1 tablet by mouth daily with breakfast.     Omega-3 Fatty Acids (FISH OIL PO) Take 1 capsule by mouth daily.     pantoprazole (PROTONIX) 40 MG tablet TAKE ONE TABLET BY MOUTH ONCE A DAY 90 tablet 1   predniSONE (DELTASONE) 10 MG tablet      sacubitril-valsartan (ENTRESTO) 49-51 MG Take 1 tablet by mouth 2 (two) times daily. 60 tablet 5   spironolactone (ALDACTONE) 25 MG tablet Take 1 tablet (25 mg total) by mouth daily. 90 tablet 3   triamcinolone cream (KENALOG) 0.5 % Apply 1 Application topically 2 (two) times daily. For 7 days 30 g 0   No current facility-administered medications for this visit.   Vitals:   02/06/23 0926 02/06/23 0946  BP: (!) 151/84 (!) 145/74  Pulse: 67   SpO2: 100%   Weight: 195 lb (88.5 kg)    Wt Readings from Last 3 Encounters:  02/06/23 195 lb (88.5 kg)  01/02/23 194 lb (88 kg)  12/19/22 196 lb 6.4 oz (89.1 kg)   Lab Results  Component Value Date   CREATININE 1.24 01/02/2023  CREATININE 1.28 12/19/2022   CREATININE 1.35 (H) 11/23/2022    PHYSICAL EXAM:  General:  Well appearing. No resp difficulty HEENT: normal Neck: supple. JVP flat. No lymphadenopathy or thryomegaly appreciated. Cor: PMI normal. Regular rate & rhythm. No rubs, gallops or murmurs. Lungs: clear Abdomen: soft, nontender, nondistended. No hepatosplenomegaly. No bruits or masses.  Extremities: no  cyanosis, clubbing, rash, edema Neuro: alert & oriented x3, cranial nerves grossly intact. Moves all 4 extremities w/o difficulty. Affect pleasant.   ECG: not done   ASSESSMENT & PLAN:  1: NICM with reduced ejection fraction- - suspect due to previous heavy alcohol use as cath showed nonobstructive CAD - NYHA class II - euvolemic today - weighing daily; reminded to call for an overnight weight gain of >2 pounds/weekly weight gain of >5 pounds.  - weight up 1 pounds from last visit here 1 month ago - Echo 2015: EF 45-50%. - Echo 01/26/15: EF of 25-30% along with trivial Travis/ TR. - Echo 08/08/16: EF of 35-40%. - Echo 06/04/19: EF of 35-40% along with mild LVH. - Echo 11/09/19: EF of 45-50% along with mild LVH - Echo 01/07/21: EF of 45-50% along with mild Travis. - Echo 06/19/22: EF 25-30% with mild LVH, Grade I DD, mild Travis and mild LAE     - cMRI 10/11/22: 1.  Normal LV size with diffuse hypokinesis, EF 41%.  2.  Normal RV size with mildly decreased systolic function, EF 49%.  3. Subtle mid-wall LGE in the basal septum, this is nonspecific and often seen with nonischemic cardiomyopathies.  4. Mildly elevated extracellular volume percentage, suggesting increased myocardial fibrosis. - EF out of range for ICD on 7/24 cMRI. Narrow QRS so not candidate for CRT. - sometimes cooks with salt - drinking approximately 64 oz of fluid daily - continue carvedilol 12.5mg  BID - continue spironolactone 25mg  daily  - continue entresto 49/51mg  BID; consider titration at next visit as he needs slow and careful GDMT titration with history of dehydration and orthostasis. - tried farxiga in the past but he felt terrible on it - palliative care visit done 11/06/21 - BNP 09/19/22 was 26.2  2: HTN- - BP 145/74 - saw PCP Toney Reil) 09/24 - BMP 01/02/23 reviewed and shows sodium 136, potassium 4.2, creatinine 1.24 and GFR >60 - saw nephrology Suezanne Jacquet) 09/22  3: Hyperlipidemia- - continues to not smoke - LDL  01/02/23 was 71 - continue atorvastatin 80mg  daily - continue zetia 10mg  daily - continue fenofibrate 145mg  daily - went to lipid clinic 10/24 and started repatha injection every 2 weeks  4: DM- - checks blood glucose twice a week - A1c 08/16/22 was 6.7% - continue glimepiride 1mg  daily - continue metformin 500mg  BID  5: CAD- - saw cardiology Kirke Corin) 05/24 - continue ASA 81mg  daily - continue atorvastatin 80mg  daily - RHC/ LHC 07/17/22:   Prox LAD to Mid LAD lesion is 60% stenosed.   1st Diag lesion is 40% stenosed.    1. Normal filling pressures and preserved cardiac output.    2. Moderate CAD with 60% calcified stenosis proximal LAD/D1. Doubt this flow limiting and does not explain fall in EF.      Return in 6 weeks, sooner if needed.

## 2023-02-06 ENCOUNTER — Encounter: Payer: Self-pay | Admitting: Family

## 2023-02-06 ENCOUNTER — Ambulatory Visit: Payer: 59 | Attending: Family | Admitting: Family

## 2023-02-06 VITALS — BP 145/74 | HR 67 | Wt 195.0 lb

## 2023-02-06 DIAGNOSIS — D649 Anemia, unspecified: Secondary | ICD-10-CM | POA: Insufficient documentation

## 2023-02-06 DIAGNOSIS — I5022 Chronic systolic (congestive) heart failure: Secondary | ICD-10-CM | POA: Diagnosis not present

## 2023-02-06 DIAGNOSIS — N1831 Chronic kidney disease, stage 3a: Secondary | ICD-10-CM | POA: Diagnosis not present

## 2023-02-06 DIAGNOSIS — I428 Other cardiomyopathies: Secondary | ICD-10-CM | POA: Insufficient documentation

## 2023-02-06 DIAGNOSIS — Z87891 Personal history of nicotine dependence: Secondary | ICD-10-CM | POA: Insufficient documentation

## 2023-02-06 DIAGNOSIS — E1122 Type 2 diabetes mellitus with diabetic chronic kidney disease: Secondary | ICD-10-CM | POA: Insufficient documentation

## 2023-02-06 DIAGNOSIS — I2510 Atherosclerotic heart disease of native coronary artery without angina pectoris: Secondary | ICD-10-CM

## 2023-02-06 DIAGNOSIS — F1011 Alcohol abuse, in remission: Secondary | ICD-10-CM | POA: Diagnosis not present

## 2023-02-06 DIAGNOSIS — I130 Hypertensive heart and chronic kidney disease with heart failure and stage 1 through stage 4 chronic kidney disease, or unspecified chronic kidney disease: Secondary | ICD-10-CM | POA: Insufficient documentation

## 2023-02-06 DIAGNOSIS — Z8673 Personal history of transient ischemic attack (TIA), and cerebral infarction without residual deficits: Secondary | ICD-10-CM | POA: Diagnosis not present

## 2023-02-06 DIAGNOSIS — Z86718 Personal history of other venous thrombosis and embolism: Secondary | ICD-10-CM | POA: Diagnosis not present

## 2023-02-06 DIAGNOSIS — M1990 Unspecified osteoarthritis, unspecified site: Secondary | ICD-10-CM | POA: Diagnosis present

## 2023-02-06 DIAGNOSIS — N189 Chronic kidney disease, unspecified: Secondary | ICD-10-CM | POA: Insufficient documentation

## 2023-02-06 DIAGNOSIS — I10 Essential (primary) hypertension: Secondary | ICD-10-CM | POA: Diagnosis not present

## 2023-02-06 DIAGNOSIS — M109 Gout, unspecified: Secondary | ICD-10-CM | POA: Diagnosis present

## 2023-02-06 DIAGNOSIS — E785 Hyperlipidemia, unspecified: Secondary | ICD-10-CM | POA: Diagnosis not present

## 2023-02-06 DIAGNOSIS — J45909 Unspecified asthma, uncomplicated: Secondary | ICD-10-CM | POA: Diagnosis not present

## 2023-02-06 MED ORDER — MAGNESIUM OXIDE 400 MG PO TABS: 400.0000 mg | ORAL_TABLET | Freq: Every day | ORAL | 1 refills | Status: DC

## 2023-02-06 NOTE — Patient Instructions (Addendum)
Here's the number to the Lipid Clinic: [phone]

## 2023-02-11 ENCOUNTER — Telehealth: Payer: Self-pay | Admitting: Cardiovascular Disease

## 2023-02-11 MED ORDER — REPATHA SURECLICK 140 MG/ML ~~LOC~~ SOAJ: 140.0000 mg | SUBCUTANEOUS | 3 refills | Status: DC

## 2023-02-11 NOTE — Telephone Encounter (Signed)
Pt c/o medication issue:  1. Name of Medication:   Evolocumab (REPATHA SURECLICK) 140 MG/ML SOAJ    2. How are you currently taking this medication (dosage and times per day)?  Inject 140 mg into the skin every 14 (fourteen) days.       3. Are you having a reaction (difficulty breathing--STAT)? No  4. What is your medication issue? Pt is requesting a callback regarding him wanting to be advised on what to do since he's out of this medication. He'd like to know if he should request a refill to continue to take it or not. Please advise.

## 2023-02-11 NOTE — Telephone Encounter (Signed)
Spoke with patient and reviewed medication and he requested refill be sent into the pharmacy. Prescription was sent and all questions were answered. No further needs

## 2023-03-26 ENCOUNTER — Encounter: Payer: Self-pay | Admitting: Family

## 2023-03-26 ENCOUNTER — Ambulatory Visit: Payer: 59 | Attending: Family | Admitting: Family

## 2023-03-26 VITALS — BP 155/80 | HR 78 | Wt 199.0 lb

## 2023-03-26 DIAGNOSIS — I10 Essential (primary) hypertension: Secondary | ICD-10-CM | POA: Diagnosis not present

## 2023-03-26 DIAGNOSIS — I5032 Chronic diastolic (congestive) heart failure: Secondary | ICD-10-CM | POA: Diagnosis not present

## 2023-03-26 DIAGNOSIS — Z7984 Long term (current) use of oral hypoglycemic drugs: Secondary | ICD-10-CM | POA: Diagnosis not present

## 2023-03-26 DIAGNOSIS — Z833 Family history of diabetes mellitus: Secondary | ICD-10-CM | POA: Insufficient documentation

## 2023-03-26 DIAGNOSIS — Z8249 Family history of ischemic heart disease and other diseases of the circulatory system: Secondary | ICD-10-CM | POA: Insufficient documentation

## 2023-03-26 DIAGNOSIS — Z79899 Other long term (current) drug therapy: Secondary | ICD-10-CM | POA: Diagnosis not present

## 2023-03-26 DIAGNOSIS — Z7982 Long term (current) use of aspirin: Secondary | ICD-10-CM | POA: Insufficient documentation

## 2023-03-26 DIAGNOSIS — N189 Chronic kidney disease, unspecified: Secondary | ICD-10-CM | POA: Diagnosis not present

## 2023-03-26 DIAGNOSIS — I5022 Chronic systolic (congestive) heart failure: Secondary | ICD-10-CM

## 2023-03-26 DIAGNOSIS — I2510 Atherosclerotic heart disease of native coronary artery without angina pectoris: Secondary | ICD-10-CM | POA: Diagnosis not present

## 2023-03-26 DIAGNOSIS — I130 Hypertensive heart and chronic kidney disease with heart failure and stage 1 through stage 4 chronic kidney disease, or unspecified chronic kidney disease: Secondary | ICD-10-CM | POA: Insufficient documentation

## 2023-03-26 DIAGNOSIS — E785 Hyperlipidemia, unspecified: Secondary | ICD-10-CM | POA: Diagnosis not present

## 2023-03-26 DIAGNOSIS — N1831 Chronic kidney disease, stage 3a: Secondary | ICD-10-CM | POA: Diagnosis not present

## 2023-03-26 DIAGNOSIS — Z86718 Personal history of other venous thrombosis and embolism: Secondary | ICD-10-CM | POA: Insufficient documentation

## 2023-03-26 DIAGNOSIS — Z8673 Personal history of transient ischemic attack (TIA), and cerebral infarction without residual deficits: Secondary | ICD-10-CM | POA: Diagnosis not present

## 2023-03-26 DIAGNOSIS — E1122 Type 2 diabetes mellitus with diabetic chronic kidney disease: Secondary | ICD-10-CM

## 2023-03-26 DIAGNOSIS — I428 Other cardiomyopathies: Secondary | ICD-10-CM | POA: Diagnosis not present

## 2023-03-26 MED ORDER — ENTRESTO 49-51 MG PO TABS: 1.0000 | ORAL_TABLET | Freq: Two times a day (BID) | ORAL | 3 refills | Status: DC

## 2023-03-26 NOTE — Progress Notes (Signed)
 PCP: Wendee Agent, NP (last seen 09/24) Primary Cardiologist: Harless Grass, MD (last seen 05/24) HF MD: Rolan Barrack, MD (last seen 08/24)  Chief Complaint: fatigue   HPI:  Travis [name] is a 67 y/o male with a history of osteoarthritis, hyperlipidemia, gout, HTN, GI bleed, type 2 DM, CAD, anemia, asthma, previous alcohol  use (quit 2022), central retinal artery occlusion left eye (2015), CKD, appendicitis 10/22 complicated by sepsis and splenic rupture needing splenectomy which resulted in a lengthy admission, LUE DVT, TIA, current tobacco use and chronic heart failure.  Has not been admitted or been in the ED in the last 6 months  Echo 11/09/19: EF of 45-50% along with mild LVH Echo 01/07/21: EF of 45-50% along with mild Travis. Echo 06/19/22: EF 25-30% with mild LVH, Grade I DD, mild Travis and mild LAE        RHC/ LHC 07/17/22:   Prox LAD to Mid LAD lesion is 60% stenosed.   1st Diag lesion is 40% stenosed.  1. Normal filling pressures and preserved cardiac output.  2. Moderate CAD with 60% calcified stenosis proximal LAD/D1.  I do not think this is flow limiting and does not explain fall in EF.     cMRI 10/11/22: 1.  Normal LV size with diffuse hypokinesis, EF 41%.  2.  Normal RV size with mildly decreased systolic function, EF 49%.  3. Subtle mid-wall LGE in the basal septum, this is nonspecific and often seen with nonischemic cardiomyopathies.  4. Mildly elevated extracellular volume percentage, suggesting increased myocardial fibrosis.  He presents today for a HF f/u visit with a chief complaint of minimal fatigue with moderate exertion. Chronic in nature. Has associated intermittent dizziness along with this. Reports sleeping well on 1-2 pillows. Denies shortness of breath, chest pain, cough, palpitations, abdominal distention, pedal edema or weight gain. Did not take his medications yet this morning because he woke up late. He did take a decongestant this morning.   Previous cardiac  studies:  Echo 2015: EF 45-50%. Echo 01/26/15: EF of 25-30% along with trivial Travis/ TR.  LHC 01/28/15:  10% stenosis in Prox RCA to Mid RCA and 30% stenosis in Prox LAD to Mid LAD which is not much different from previous angiography.  Cardiomyopathy felt to be due to previous alcohol  use.   Echo 08/08/16: EF of 35-40%. Echo 06/04/19: EF of 35-40% along with mild LVH.  ROS: All systems negative except as listed in HPI, PMH and Problem List.  SH:  Social History   Socioeconomic History   Marital status: Single    Spouse name: Not on file   Number of children: 0   Years of education: 12   Highest education level: High school graduate  Occupational History   Occupation: Retired  Tobacco Use   Smoking status: Former    Current packs/day: 0.00    Average packs/day: 0.8 packs/day for 30.0 years (22.5 ttl pk-yrs)    Types: Cigarettes    Start date: 12/1990    Quit date: 12/2020    Years since quitting: 2.2   Smokeless tobacco: Never  Vaping Use   Vaping status: Never Used  Substance and Sexual Activity   Alcohol  use: Not Currently   Drug use: Not Currently   Sexual activity: Yes    Birth control/protection: Condom  Other Topics Concern   Not on file  Social History Narrative   Not on file   Social Drivers of Health   Financial Resource Strain: Low Risk  (08/02/2022)  Overall Financial Resource Strain (CARDIA)    Difficulty of Paying Living Expenses: Not hard at all  Food Insecurity: No Food Insecurity (08/02/2022)   Hunger Vital Sign    Worried About Running Out of Food in the Last Year: Never true    Ran Out of Food in the Last Year: Never true  Transportation Needs: No Transportation Needs (08/02/2022)   PRAPARE - Administrator, Civil Service (Medical): No    Lack of Transportation (Non-Medical): No  Physical Activity: Insufficiently Active (08/02/2022)   Exercise Vital Sign    Days of Exercise per Week: 7 days    Minutes of Exercise per Session: 20 min   Stress: No Stress Concern Present (08/02/2022)   [employer] of Occupational Health - Occupational Stress Questionnaire    Feeling of Stress : Not at all  Social Connections: Moderately Isolated (08/02/2022)   Social Connection and Isolation Panel [NHANES]    Frequency of Communication with Friends and Family: More than three times a week    Frequency of Social Gatherings with Friends and Family: More than three times a week    Attends Religious Services: More than 4 times per year    Active Member of [employer] or Organizations: No    Attends Banker Meetings: Never    Marital Status: Never married  Intimate Partner Violence: Not At Risk (08/02/2022)   Humiliation, Afraid, Rape, and Kick questionnaire    Fear of Current or Ex-Partner: No    Emotionally Abused: No    Physically Abused: No    Sexually Abused: No    FH:  Family History  Problem Relation Age of Onset   Diabetes Mother    Hypertension Mother    Diabetes Father    Hypertension Father    Diabetes Sister    Dementia Brother    Cancer Neg Hx    Heart disease Neg Hx    Stroke Neg Hx    Colon cancer Neg Hx    Esophageal cancer Neg Hx    Rectal cancer Neg Hx    Stomach cancer Neg Hx     Past Medical History:  Diagnosis Date   Alcohol  abuse    Alcoholic cardiomyopathy (HCC) 87/78/7985   a. 05/2019 Echo: EF 35-40%; b. 10/2019 Echo: EF 45-50%.   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 12/2010 Echo: EF 20-25%; b 08/2013 Echo: EF 45-50%; c. 01/2015 Echo: EF 25-30%; d. 07/2016 Echo: EF 35-40%; e. 05/2019 Echo: EF 35-40%; d. 10/2019 Echo: EF 45-50%, mild LVH, mild red RV fxn, mildl dil RA, Triv Travis. Mild to mod Ao Sclerosis  w/o stenosis.   Hyperlipidemia    Hypertension    Nonischemic cardiomyopathy (HCC)    Nonobstructive Coronary artery disease    a. 01/2015 Cath: LM nl, LAD 30p/m, LCX nl, RCA 10p/m, RPDA min irregs.   Osteoarthritis    Pneumonia    TIA (transient ischemic attack)    Tobacco abuse    Vision loss    peripherial vision only left eye.Central Retinal  artery occusion  Current Outpatient Medications  Medication Sig Dispense Refill   acetaminophen  (TYLENOL ) 500 MG tablet Take 2 tablets (1,000 mg total) by mouth every 8 (eight) hours as needed. 30 tablet 0   albuterol  (VENTOLIN  HFA) 108 (90 Base) MCG/ACT inhaler Inhale 2 puffs into the lungs every 6 (six) hours as needed for wheezing or shortness of breath.     allopurinol  (ZYLOPRIM ) 100 MG tablet TAKE ONE TABLET BY MOUTH TWICE A DAY 180 tablet 1   aspirin  (ASPIRIN  CHILDRENS) 81 MG chewable tablet Chew 1 tablet (81 mg total) by mouth daily. 36 tablet 11   atorvastatin  (LIPITOR) 80 MG tablet Take 1 tablet (80 mg total) by mouth daily. 90 tablet 3   Blood Pressure KIT Check b/p once daily. Based on insurance preference 1 kit 0   carvedilol  (COREG ) 12.5 MG tablet Take 12.5 mg by mouth 2 (two) times daily with a meal.     cetirizine  (ZYRTEC ) 10 MG tablet Take 1 tablet (10 mg total) by mouth daily. 90 tablet 3   diphenhydrAMINE  (BENADRYL ) 25 MG tablet Take 25 mg by mouth daily as needed for allergies.     Evolocumab  (REPATHA  SURECLICK) 140 MG/ML SOAJ Inject 140 mg into the skin every 14 (fourteen) days. 6 mL 3   ezetimibe  (ZETIA ) 10 MG tablet Take 1 tablet (10 mg total) by mouth daily. 30 tablet 11   fenofibrate  (TRICOR ) 145 MG tablet TAKE ONE TABLET BY MOUTH ONCE A DAY 90 tablet 1   ferrous sulfate  325 (65 FE) MG tablet Take 1 tablet (325 mg total) by mouth daily with breakfast. 90 tablet 2   fluticasone  (FLONASE ) 50 MCG/ACT nasal spray Place 1-2 sprays into both nostrils daily as needed for allergies or rhinitis.     glimepiride  (AMARYL ) 1 MG  tablet TAKE ONE TABLET BY MOUTH EVERY MORNING WITH BREAKFAST 90 tablet 1   hydroxypropyl methylcellulose / hypromellose (ISOPTO TEARS / GONIOVISC) 2.5 % ophthalmic solution Place 1 drop into both eyes 2 (two) times daily.     magnesium  oxide (MAG-OX) 400 MG tablet Take 1 tablet (400 mg total) by mouth daily. 90 tablet 1   metFORMIN  (GLUCOPHAGE ) 500 MG tablet TAKE TWO TABLETS BY MOUTH TWICE A DAY WITH A MEAL 360 tablet 1   multivitamin (ONE-A-DAY MEN'S) TABS tablet Take 1 tablet by mouth daily with breakfast.     Omega-3 Fatty Acids (FISH OIL  PO) Take 1 capsule by mouth daily.     pantoprazole  (PROTONIX ) 40 MG tablet TAKE ONE TABLET BY MOUTH ONCE A DAY 90 tablet 1   predniSONE  (DELTASONE ) 10 MG tablet Take 10 mg by mouth daily with breakfast.     sacubitril -valsartan  (ENTRESTO ) 49-51 MG Take 1 tablet by mouth 2 (two) times daily. 60 tablet 5   spironolactone  (ALDACTONE ) 25 MG tablet Take 1 tablet (25 mg total) by mouth daily. 90 tablet 3   triamcinolone  cream (KENALOG ) 0.5 % Apply 1 Application topically 2 (two) times daily. For 7 days 30 g 0   No current facility-administered medications for this visit.   Vitals:   03/26/23 0932  BP: (!) 155/80  Pulse: 78  SpO2: 99%  Weight: 199 lb (90.3 kg)   Wt Readings from Last 3 Encounters:  03/26/23 199 lb (90.3 kg)  02/06/23 195 lb (88.5 kg)  01/02/23 194 lb (88 kg)   Lab Results  Component Value Date   CREATININE 1.24 01/02/2023   CREATININE 1.28 12/19/2022   CREATININE 1.35 (H) 11/23/2022   PHYSICAL EXAM:  General:  Well appearing. No resp difficulty HEENT: normal Neck: supple. JVP flat. No lymphadenopathy or thryomegaly appreciated. Cor: PMI normal. Regular rate & rhythm. No rubs, gallops or murmurs. Lungs: clear Abdomen: soft, nontender, nondistended. No hepatosplenomegaly. No bruits or masses.  Extremities: no cyanosis, clubbing, rash, edema Neuro: alert & oriented x3, cranial nerves grossly intact. Moves all 4 extremities w/o  difficulty. Affect pleasant.   ECG: not done   ASSESSMENT & PLAN:  1: NICM with reduced ejection fraction- - suspect due to previous heavy alcohol  use as cath showed nonobstructive CAD - NYHA class II - euvolemic today - weighing daily; reminded to call for an overnight weight gain of >2 pounds/weekly weight gain of >5 pounds.  - weight up 4 pounds from last visit here 6 weeks ago - Echo 11/09/19: EF of 45-50% along with mild LVH - Echo 01/07/21: EF of 45-50% along with mild Travis. - Echo 06/19/22: EF 25-30% with mild LVH, Grade I DD, mild Travis and mild LAE     - cMRI 10/11/22:   1.  Normal LV size with diffuse hypokinesis, EF 41%.    2.  Normal RV size with mildly decreased systolic function, EF 49%.    3. Subtle mid-wall LGE in the basal septum, this is nonspecific and   often seen with nonischemic cardiomyopathies.    4. Mildly elevated extracellular volume percentage, suggesting increased myocardial fibrosis. - EF out of range for ICD on 07/24 cMRI. Narrow QRS so not candidate for CRT. - sometimes cooks with salt - drinking approximately 64 oz of fluid daily - continue carvedilol  12.5mg  BID - continue entresto  49/51mg  BID; titrate up if able although he has been hypotensive in the past - continue spironolactone  25mg  daily  - tried farxiga  in the past but he felt terrible on it - palliative care visit done 11/06/21 - BNP 09/19/22 was 26.2  2: HTN- - BP 155/80 although he hasn't taken his medications today and took a decongestant this morning - will take his medications when he returns home - saw PCP Stacie) 09/24 - BMP 01/02/23 reviewed and shows sodium 136, potassium 4.2, creatinine 1.24 and GFR >60 - saw nephrology Kolleen) 09/22  3: Hyperlipidemia- - continues to not smoke - LDL 01/02/23 was 71 - continue atorvastatin  80mg  daily - continue zetia  10mg  daily - continue fenofibrate  145mg  daily - went to lipid clinic 10/24 and started repatha  injection every 2 weeks  4: DM- -  checks blood glucose twice a week - A1c 08/16/22 was 6.7% - continue glimepiride  1mg  daily - continue metformin  500mg  BID  5: CAD- - saw cardiology Marsa) 05/24 - continue ASA 81mg  daily - continue atorvastatin  80mg  daily - RHC/ LHC 07/17/22:   Prox LAD to Mid LAD lesion is 60% stenosed.   1st Diag lesion is 40% stenosed.    1. Normal filling pressures and preserved cardiac output.    2. Moderate CAD with 60% calcified stenosis proximal LAD/D1. Doubt this flow limiting and does not explain fall in EF.      Return in 6 months, sooner if needed.

## 2023-03-26 NOTE — Patient Instructions (Signed)
 It was great to see you today!   If you receive a satisfaction survey regarding the [facility], please take the time to fill it out. This way we can continue to provide excellent care and make any changes that need to be made.

## 2023-04-03 ENCOUNTER — Other Ambulatory Visit: Payer: Self-pay | Admitting: Nurse Practitioner

## 2023-04-08 LAB — HEMOGLOBIN A1C: Hemoglobin A1C: 7.2

## 2023-04-22 ENCOUNTER — Other Ambulatory Visit: Payer: Self-pay | Admitting: Nurse Practitioner

## 2023-04-22 DIAGNOSIS — Z760 Encounter for issue of repeat prescription: Secondary | ICD-10-CM

## 2023-05-20 ENCOUNTER — Other Ambulatory Visit: Payer: Self-pay | Admitting: Nurse Practitioner

## 2023-05-21 ENCOUNTER — Encounter: Payer: Self-pay | Admitting: Nurse Practitioner

## 2023-06-11 ENCOUNTER — Other Ambulatory Visit: Payer: Self-pay | Admitting: Nurse Practitioner

## 2023-06-11 DIAGNOSIS — E1165 Type 2 diabetes mellitus with hyperglycemia: Secondary | ICD-10-CM

## 2023-06-12 NOTE — Telephone Encounter (Signed)
 Patient needs DM2 follow up in 30 days to continue getting refills

## 2023-06-13 NOTE — Telephone Encounter (Signed)
 Lvmtcb, sent mychart message

## 2023-06-14 NOTE — Telephone Encounter (Signed)
 Spoke to pt, scheduled f/u for 07/17/23

## 2023-07-17 ENCOUNTER — Other Ambulatory Visit: Payer: Self-pay | Admitting: Physician Assistant

## 2023-07-17 ENCOUNTER — Ambulatory Visit (INDEPENDENT_AMBULATORY_CARE_PROVIDER_SITE_OTHER): Admitting: Nurse Practitioner

## 2023-07-17 VITALS — BP 118/80 | HR 68 | Temp 98.1°F | Ht 76.0 in | Wt 191.6 lb

## 2023-07-17 DIAGNOSIS — I5022 Chronic systolic (congestive) heart failure: Secondary | ICD-10-CM | POA: Diagnosis not present

## 2023-07-17 DIAGNOSIS — M79641 Pain in right hand: Secondary | ICD-10-CM

## 2023-07-17 DIAGNOSIS — E119 Type 2 diabetes mellitus without complications: Secondary | ICD-10-CM

## 2023-07-17 DIAGNOSIS — M79642 Pain in left hand: Secondary | ICD-10-CM

## 2023-07-17 DIAGNOSIS — Z7984 Long term (current) use of oral hypoglycemic drugs: Secondary | ICD-10-CM | POA: Diagnosis not present

## 2023-07-17 DIAGNOSIS — I10 Essential (primary) hypertension: Secondary | ICD-10-CM | POA: Diagnosis not present

## 2023-07-17 LAB — POCT GLYCOSYLATED HEMOGLOBIN (HGB A1C): Hemoglobin A1C: 6.2 % — AB (ref 4.0–5.6)

## 2023-07-17 MED ORDER — TRAMADOL HCL 50 MG PO TABS: 50.0000 mg | ORAL_TABLET | Freq: Three times a day (TID) | ORAL | 0 refills | Status: AC | PRN

## 2023-07-17 NOTE — Assessment & Plan Note (Signed)
 Patient currently maintained on spironolactone , aspirin , Entresto .  He has followed by cardiology.  Continue taking medication as prescribed follow-up with specialist as recommended

## 2023-07-17 NOTE — Patient Instructions (Signed)
 Nice to see you toyda I want to see you in 6 months, sooner if you need me  We will do labs at the next visit Your A1C was 6.2 today

## 2023-07-17 NOTE — Assessment & Plan Note (Signed)
 History of same we have done bilateral x-rays along with ANA and rheumatoid factor blood work.  Patient can use Tylenol  as directed warm compresses and tramadol  50 mg 3 times daily as needed

## 2023-07-17 NOTE — Progress Notes (Signed)
 Established Patient Office Visit  Subjective   Patient ID: Travis [name], male    DOB: May 05, 1955  Age: 68 y.o. MRN: [id number]  Chief Complaint  Patient presents with   Diabetes   Medication Refill    Tramadol .       DM2: glimepiride  1mg , metformin  1500mg  daily  2 in the onrning and 1 afternoon. States that he feels like he is doing well. He will get sluggish and take a nap.   HLD: currenlty on atorvastatin , repatha , zetia , tricor   CHF: on sprionolactone, Entresto  and asa.  Patient is followed by cardiology.  He does have an appointment coming up with [facility]  Hand pain: we have discussed this in the past. It is bilateral hands worse in the evening and mornings. He has used heat and tylenol  to help with the discomfort. He was a Scientist, water quality as a profession. This has also caused him to have chronic back pain. He will use tramadol  intermittently and sparringly when the other conservative treatments do not work.     Review of Systems  Constitutional:  Negative for chills and fever.  Respiratory:  Negative for shortness of breath.   Cardiovascular:  Negative for chest pain.  Gastrointestinal:  Negative for abdominal pain, diarrhea, nausea and vomiting.       BM daily  Musculoskeletal:  Positive for joint pain.      Objective:     BP 118/80   Pulse 68   Temp 98.1 F (36.7 C) (Oral)   Ht 6\' 4"  (1.93 m)   Wt 191 lb 9.6 oz (86.9 kg)   SpO2 98%   BMI 23.32 kg/m  BP Readings from Last 3 Encounters:  07/17/23 118/80  03/26/23 (!) 155/80  02/06/23 (!) 145/74   Wt Readings from Last 3 Encounters:  07/17/23 191 lb 9.6 oz (86.9 kg)  03/26/23 199 lb (90.3 kg)  02/06/23 195 lb (88.5 kg)   SpO2 Readings from Last 3 Encounters:  07/17/23 98%  03/26/23 99%  02/06/23 100%      Physical Exam Vitals and nursing note reviewed.  Constitutional:      Appearance: Normal appearance.  Cardiovascular:     Rate and Rhythm: Normal rate and regular rhythm.     Heart  sounds: Normal heart sounds.  Pulmonary:     Effort: Pulmonary effort is normal.     Breath sounds: Normal breath sounds.  Abdominal:     General: Bowel sounds are normal.  Neurological:     Mental Status: He is alert.      Results for orders placed or performed in visit on 07/17/23  POCT glycosylated hemoglobin (Hb A1C)  Result Value Ref Range   Hemoglobin A1C 6.2 (A) 4.0 - 5.6 %   HbA1c POC (<> result, manual entry)     HbA1c, POC (prediabetic range)     HbA1c, POC (controlled diabetic range)        The ASCVD Risk score (Arnett DK, et al., 2019) failed to calculate for the following reasons:   The valid total cholesterol range is 130 to 320 mg/dL    Assessment & Plan:   Problem List Items Addressed This Visit       Cardiovascular and Mediastinum   Chronic systolic heart failure (HCC) - Primary   Patient currently maintained on spironolactone , aspirin , Entresto .  He has followed by cardiology.  Continue taking medication as prescribed follow-up with specialist as recommended      Relevant Medications   ASPIRIN  81  PO   Essential hypertension   Relevant Medications   ASPIRIN  81 PO     Endocrine   Well controlled type 2 diabetes mellitus (HCC)   Patient currently maintained on glimepiride  1 mg daily, metformin  1000 mg every morning and 500 mg every afternoon.  Patient's A1c is well-controlled continue medication as prescribed      Relevant Medications   ASPIRIN  81 PO   Other Relevant Orders   POCT glycosylated hemoglobin (Hb A1C) (Completed)     Other   Bilateral hand pain   History of same we have done bilateral x-rays along with ANA and rheumatoid factor blood work.  Patient can use Tylenol  as directed warm compresses and tramadol  50 mg 3 times daily as needed      Relevant Medications   traMADol  (ULTRAM ) 50 MG tablet    Return in about 6 months (around 01/16/2024) for CPE and Labs.    Margarie Shay, NP

## 2023-07-17 NOTE — Assessment & Plan Note (Signed)
 Patient currently maintained on glimepiride  1 mg daily, metformin  1000 mg every morning and 500 mg every afternoon.  Patient's A1c is well-controlled continue medication as prescribed

## 2023-07-23 ENCOUNTER — Telehealth: Payer: Self-pay | Admitting: Cardiovascular Disease

## 2023-07-23 NOTE — Telephone Encounter (Signed)
*  STAT* If patient is at the pharmacy, call can be transferred to refill team.   1. Which medications need to be refilled? (please list name of each medication and dose if known)  carvedilol  (COREG ) 12.5 MG tablet    2. Which pharmacy/location (including street and city if local pharmacy) is medication to be sent to?  [facility] - [place], [place] - [address]    3. Do they need a 30 day or 90 day supply? 90

## 2023-07-24 ENCOUNTER — Other Ambulatory Visit: Payer: Self-pay | Admitting: Family

## 2023-07-24 MED ORDER — CARVEDILOL 12.5 MG PO TABS: 12.5000 mg | ORAL_TABLET | Freq: Two times a day (BID) | ORAL | 3 refills | Status: AC

## 2023-07-25 ENCOUNTER — Other Ambulatory Visit: Payer: Self-pay | Admitting: Nurse Practitioner

## 2023-07-25 DIAGNOSIS — E1165 Type 2 diabetes mellitus with hyperglycemia: Secondary | ICD-10-CM

## 2023-08-06 ENCOUNTER — Other Ambulatory Visit: Payer: Self-pay | Admitting: Family

## 2023-08-06 ENCOUNTER — Ambulatory Visit (INDEPENDENT_AMBULATORY_CARE_PROVIDER_SITE_OTHER): Payer: 59

## 2023-08-06 VITALS — Ht 76.0 in | Wt 191.0 lb

## 2023-08-06 DIAGNOSIS — Z0000 Encounter for general adult medical examination without abnormal findings: Secondary | ICD-10-CM

## 2023-08-06 NOTE — Progress Notes (Signed)
 Subjective:   Travis [name] is a 68 y.o. who presents for a Medicare Wellness preventive visit.  As a reminder, Annual Wellness Visits don't include a physical exam, and some assessments may be limited, especially if this visit is performed virtually. We may recommend an in-person visit if needed.  Visit Complete: Virtual I connected with  Travis [name] on 08/06/23 by a audio enabled telemedicine application and verified that I am speaking with the correct person using two identifiers.  Patient Location: Home  Provider Location: Office/Clinic  I discussed the limitations of evaluation and management by telemedicine. The patient expressed understanding and agreed to proceed.  Vital Signs: Because this visit was a virtual/telehealth visit, some criteria may be missing or patient reported. Any vitals not documented were not able to be obtained and vitals that have been documented are patient reported.  VideoDeclined- This patient declined Librarian, academic. Therefore the visit was completed with audio only.  Persons Participating in Visit: Patient.  AWV Questionnaire: No: Patient Medicare AWV questionnaire was not completed prior to this visit.  Cardiac Risk Factors include: advanced age (>64men, >9 women);diabetes mellitus;dyslipidemia;male gender;hypertension     Objective:     Today's Vitals   08/06/23 0850  Weight: 191 lb (86.6 kg)  Height: 6\' 4"  (1.93 m)   Body mass index is 23.25 kg/m.     08/06/2023    9:01 AM 08/02/2022    9:39 AM 07/17/2022    6:23 AM 12/20/2021    2:33 PM 12/20/2021    2:30 PM 07/31/2021   10:17 AM 02/05/2021    6:00 PM  Advanced Directives  Does Patient Have a Medical Advance Directive? Yes Yes No Yes No Yes Yes  Type of Estate agent of [place];Living will Healthcare Power of [place];Living will  Living will;Healthcare Power of [employer] Power of [place];Living will Healthcare Power of  Attorney  Does patient want to make changes to medical advance directive?  No - Patient declined  No - Patient declined No - Patient declined No - Patient declined No - Patient declined  Copy of Healthcare Power of Attorney in Chart? Yes - validated most recent copy scanned in chart (See row information) Yes - validated most recent copy scanned in chart (See row information)  No - copy requested  No - copy requested No - copy requested  Would patient like information on creating a medical advance directive?  No - Patient declined No - Patient declined No - Patient declined No - Patient declined      Current Medications (verified) Outpatient Encounter Medications as of 08/06/2023  Medication Sig   acetaminophen  (TYLENOL ) 500 MG tablet Take 2 tablets (1,000 mg total) by mouth every 8 (eight) hours as needed.   albuterol  (VENTOLIN  HFA) 108 (90 Base) MCG/ACT inhaler Inhale 2 puffs into the lungs every 6 (six) hours as needed for wheezing or shortness of breath.   allopurinol  (ZYLOPRIM ) 100 MG tablet TAKE ONE TABLET BY MOUTH TWICE A DAY   ASPIRIN  81 PO    atorvastatin  (LIPITOR) 80 MG tablet Take 1 tablet (80 mg total) by mouth daily.   Blood Pressure KIT Check b/p once daily. Based on insurance preference   carvedilol  (COREG ) 12.5 MG tablet Take 1 tablet (12.5 mg total) by mouth 2 (two) times daily with a meal.   cetirizine  (ZYRTEC ) 10 MG tablet TAKE ONE TABLET BY MOUTH ONCE DAILY   diphenhydrAMINE  (BENADRYL ) 25 MG tablet Take 25 mg by  mouth daily as needed for allergies.   Evolocumab  (REPATHA  SURECLICK) 140 MG/ML SOAJ Inject 140 mg into the skin every 14 (fourteen) days.   ezetimibe  (ZETIA ) 10 MG tablet Take 1 tablet (10 mg total) by mouth daily.   fenofibrate  (TRICOR ) 145 MG tablet TAKE ONE TABLET BY MOUTH ONCE A DAY   ferrous sulfate  325 (65 FE) MG tablet Take 1 tablet (325 mg total) by mouth daily with breakfast.   fluticasone  (FLONASE ) 50 MCG/ACT nasal spray Place 1-2 sprays into both nostrils  daily as needed for allergies or rhinitis.   glimepiride  (AMARYL ) 1 MG tablet TAKE ONE TABLET BY MOUTH EVERY MORNING WITH BREAKFAST   hydroxypropyl methylcellulose / hypromellose (ISOPTO TEARS / GONIOVISC) 2.5 % ophthalmic solution Place 1 drop into both eyes 2 (two) times daily.   magnesium  oxide (MAG-OX) 400 MG tablet Take 1 tablet (400 mg total) by mouth daily.   metFORMIN  (GLUCOPHAGE ) 500 MG tablet TAKE TWO TABLETS BY MOUTH TWICE A DAY WITH A MEAL   multivitamin (ONE-A-DAY MEN'S) TABS tablet Take 1 tablet by mouth daily with breakfast.   Omega-3 Fatty Acids (FISH OIL  PO) Take 1 capsule by mouth daily.   pantoprazole  (PROTONIX ) 40 MG tablet TAKE ONE TABLET BY MOUTH ONCE A DAY   predniSONE  (DELTASONE ) 10 MG tablet Take 10 mg by mouth daily with breakfast.   sacubitril -valsartan  (ENTRESTO ) 49-51 MG Take 1 tablet by mouth 2 (two) times daily.   spironolactone  (ALDACTONE ) 25 MG tablet Take 1 tablet (25 mg total) by mouth daily.   No facility-administered encounter medications on file as of 08/06/2023.    Allergies (verified) Farxiga  [dapagliflozin ]   History: Past Medical History:  Diagnosis Date   Alcohol  abuse    Alcoholic cardiomyopathy (HCC) 91/47/8295   a. 05/2019 Echo: EF 35-40%; b. 10/2019 Echo: EF 45-50%.   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 12/2010 Echo: EF 20-25%; b 08/2013 Echo: EF 45-50%; c. 01/2015 Echo: EF 25-30%; d. 07/2016 Echo: EF 35-40%; e. 05/2019 Echo: EF 35-40%; d. 10/2019 Echo: EF 45-50%, mild LVH, mild red RV fxn, mildl dil RA, Triv MR. Mild to mod Ao Sclerosis w/o stenosis.   Hyperlipidemia    Hypertension    Nonischemic cardiomyopathy (HCC)    Nonobstructive Coronary  artery disease    a. 01/2015 Cath: LM nl, LAD 30p/m, LCX nl, RCA 10p/m, RPDA min irregs.   Osteoarthritis    Pneumonia    TIA (transient ischemic attack)    Tobacco abuse    Vision loss    peripherial vision only left eye.Central Retinal  artery occusion    Past Surgical History:  Procedure Laterality Date   BIOPSY  11/25/2019   Procedure: BIOPSY;  Surgeon: Asencion Blacksmith, MD;  Location[facility] END[facility]COPY;  Service: Endoscopy;;   CARDIAC CATHETERIZATION     CARDIAC CATHETERIZATION N/A 01/28/2015   Procedure: Left Heart Cath and Coronary Angiography;  Surgeon: Wenona Hamilton, MD;  Location: [facility] INVASIVE CV LAB;  Service: Cardiovascular;  Laterality: N/A;   COLONOSCOPY     ESOPHAGOGASTRODUODENOSCOPY (EGD) WITH PROPOFOL  N/A 11/25/2019  Procedure: ESOPHAGOGASTRODUODENOSCOPY (EGD) WITH PROPOFOL ;  Surgeon: Asencion Blacksmith, MD;  Location: [facility] ENDOSCOPY;  Service: Endoscopy;  Laterality: N/A;   HIP ARTHROPLASTY Right 03/15/2013   Procedure: ARTHROPLASTY BIPOLAR HIP;  Surgeon: Arnie Lao, MD;  Location: [facility] OR;  Service: Orthopedics;  Laterality: Right;   LAPAROSCOPIC APPENDECTOMY N/A 01/01/2021   Procedure: DIAGNOSTIC LAPAROSCOPY; EXPLORATORY LAPAROTOMY, ILEUCECECTOMY, DRAINAGE OF ABDOMINAL ABSCESS;  Surgeon: Dorena Gander, MD;  Location: [facility] OR;  Service: General;  Laterality: N/A;   RIGHT/LEFT HEART CATH AND CORONARY ANGIOGRAPHY N/A 07/17/2022   Procedure: RIGHT/LEFT HEART CATH AND CORONARY ANGIOGRAPHY;  Surgeon: Darlis Eisenmenger, MD;  Location: [facility] INVASIVE CV LAB;  Service: Cardiovascular;  Laterality: N/A;   SPLENECTOMY, TOTAL N/A 01/06/2021   Procedure: SPLENECTOMY;  Surgeon: Anda Bamberg, MD;  Location: [facility] OR;  Service: General;  Laterality: N/A;   TOTAL SHOULDER ARTHROPLASTY Right 09/01/2015   Procedure: RIGHT TOTAL SHOULDER ARTHROPLASTY;  Surgeon: Ellard Gunning, MD;  Location: [facility] OR;  Service: Orthopedics;  Laterality: Right;   Family History  Problem Relation Age of Onset    Diabetes Mother    Hypertension Mother    Diabetes Father    Hypertension Father    Diabetes Sister    Dementia Brother    Cancer Neg Hx    Heart disease Neg Hx    Stroke Neg Hx    Colon cancer Neg Hx    Esophageal cancer Neg Hx    Rectal cancer Neg Hx    Stomach cancer Neg Hx    Social History   Socioeconomic History   Marital status: Single    Spouse name: Not on file   Number of children: 0   Years of education: 12   Highest education level: High school graduate  Occupational History   Occupation: Retired  Tobacco Use   Smoking status: Former    Current packs/day: 0.00    Average packs/day: 0.8 packs/day for 30.0 years (22.5 ttl pk-yrs)    Types: Cigarettes    Start date: 12/1990    Quit date: 12/2020    Years since quitting: 2.6   Smokeless tobacco: Never  Vaping Use   Vaping status: Never Used  Substance and Sexual Activity   Alcohol  use: Not Currently   Drug use: Not Currently   Sexual activity: Yes    Birth control/protection: Condom  Other Topics Concern   Not on file  Social History Narrative   Not on file   Social Drivers of Health   Financial Resource Strain: Low Risk  (08/06/2023)   Overall Financial Resource Strain (CARDIA)    Difficulty of Paying Living Expenses: Not hard at all  Food Insecurity: No Food Insecurity (08/06/2023)   Hunger Vital Sign    Worried About Running Out of Food in the Last Year: Never true    Ran Out of Food in the Last Year: Never true  Transportation Needs: No Transportation Needs (08/06/2023)   PRAPARE - Administrator, Civil Service (Medical): No    Lack of Transportation (Non-Medical): No  Physical Activity: Insufficiently Active (08/06/2023)   Exercise Vital Sign    Days of Exercise per Week: 7 days    Minutes of Exercise per Session: 20 min  Stress: No Stress Concern Present (08/06/2023)   [employer] of Occupational Health - Occupational Stress Questionnaire    Feeling of Stress : Only a little   Social Connections: Unknown (08/06/2023)   Social Connection and Isolation Panel [NHANES]    Frequency  of Communication with Friends and Family: Twice a week    Frequency of Social Gatherings with Friends and Family: Never    Attends Religious Services: 1 to 4 times per year    Active Member of [employer] or Organizations: No    Attends Engineer, structural: Never    Marital Status: Not on file    Tobacco Counseling Counseling given: Not Answered    Clinical Intake:  Pre-visit preparation completed: Yes  Pain : No/denies pain     BMI - recorded: 23.25 Nutritional Status: BMI of 19-24  Normal Nutritional Risks: None Diabetes: Yes CBG done?: No Did pt. bring in CBG monitor from home?: No  Lab Results  Component Value Date   HGBA1C 6.2 (A) 07/17/2023   HGBA1C 7.2 04/08/2023   HGBA1C 6.1 (A) 12/19/2022     How often do you need to have someone help you when you read instructions, pamphlets, or other written materials from your doctor or pharmacy?: 1 - Never  Interpreter Needed?: No  Comments: lives alone Information entered by :: B.Tenasia Aull,LPN   Activities of Daily Living     08/06/2023    9:01 AM  In your present state of health, do you have any difficulty performing the following activities:  Hearing? 0  Vision? 1  Difficulty concentrating or making decisions? 1  Walking or climbing stairs? 0  Dressing or bathing? 0  Doing errands, shopping? 0  Preparing Food and eating ? N  Using the Toilet? N  In the past six months, have you accidently leaked urine? N  Do you have problems with loss of bowel control? N  Managing your Medications? N  Managing your Finances? N  Housekeeping or managing your Housekeeping? N    Patient Care Team: Dorothe Gaster, NP as PCP - General (Nurse Practitioner) Wenona Hamilton, MD as PCP - Cardiology (Cardiology) Charlette Console, FNP as Nurse Practitioner (Cardiology) Wenona Hamilton, MD as Consulting Physician  (Cardiology) [facility]  Indicate any recent Medical Services you may have received from other than [facility] providers in the past year (date may be approximate).     Assessment:    This is a routine wellness examination for Travis [name].  Hearing/Vision screen Hearing Screening - Comments:: Pt says his hearing is good Vision Screening - Comments:: Pt says his left eye cannot see straight only peripheral vision ;rt eye good [facility]   Goals Addressed   None    Depression Screen     08/06/2023    8:58 AM 08/02/2022    9:38 AM 05/11/2022    9:48 AM 10/25/2021   10:07 AM 07/31/2021   10:12 AM 07/25/2021    9:35 AM 07/10/2021    9:19 AM  PHQ 2/9 Scores  PHQ - 2 Score 0 0 0 1 0 0 0  PHQ- 9 Score   8        Fall Risk     08/06/2023    8:54 AM 07/17/2023   10:10 AM 08/02/2022    9:32 AM 07/29/2022    3:51 PM 05/11/2022    9:48 AM  Fall Risk   Falls in the past year? 0 0 0 0 0  Number falls in past yr: 0 0 0 0 0  Injury with Fall? 0 0 0 1 0  Risk for fall due to : No Fall Risks No Fall Risks No Fall Risks  No Fall Risks  Follow up Education provided;Falls prevention  discussed Falls evaluation completed Falls prevention discussed;Falls evaluation completed  Falls evaluation completed    MEDICARE RISK AT HOME:  Medicare Risk at Home Any stairs in or around the home?: No If so, are there any without handrails?: No Home free of loose throw rugs in walkways, pet beds, electrical cords, etc?: Yes Adequate lighting in your home to reduce risk of falls?: Yes Life alert?: No Use of a cane, walker or w/c?: Yes Grab bars in the bathroom?: Yes Shower chair or bench in shower?: No Elevated toilet seat or a handicapped toilet?: Yes  TIMED UP AND GO:  Was the test performed?  No  Cognitive Function: 6CIT completed        08/06/2023    9:06 AM 08/02/2022    9:43 AM 07/31/2021   10:17 AM  6CIT Screen  What Year? 0 points 0 points 0 points  What month? 0 points 0 points  0 points  What time? 0 points 0 points 0 points  Count back from 20 0 points 0 points 0 points  Months in reverse 4 points 2 points 0 points  Repeat phrase 4 points 4 points 0 points  Total Score 8 points 6 points 0 points    Immunizations Immunization History  Administered Date(s) Administered   Fluad Quad(high Dose 65+) 12/28/2021   HIB (PRP-T) 01/20/2021   Influenza,inj,Quad PF,6+ Mos 03/17/2013, 04/06/2014, 01/28/2015, 01/17/2016, 01/10/2017, 02/27/2018, 12/07/2019, 12/30/2020   Meningococcal B, OMV 07/10/2021, 10/31/2021   Meningococcal Mcv4o 01/20/2021   PFIZER(Purple Top)SARS-COV-2 Vaccination 06/13/2019, 07/04/2019   PNEUMOCOCCAL CONJUGATE-20 07/05/2021   Pfizer Covid-19 Vaccine Bivalent Booster 4yrs & up 05/14/2021, 01/15/2022   Pneumococcal Conjugate-13 01/20/2021   Pneumococcal Polysaccharide-23 03/17/2013, 07/30/2017   Tdap 11/11/2016    Screening Tests Health Maintenance  Topic Date Due   Lung Cancer Screening  01/12/2022   Meningococcal B Vaccine (3 of 5 - Increased Risk Bexsero 3-dose series) 03/02/2022   OPHTHALMOLOGY EXAM  06/21/2022   COVID-19 Vaccine (5 - 2024-25 season) 11/25/2022   Diabetic kidney evaluation - Urine ACR  08/16/2023   Zoster Vaccines- Shingrix (1 of 2) 01/18/2025 (Originally 01/11/2006)   FOOT EXAM  08/16/2023   INFLUENZA VACCINE  10/25/2023   Diabetic kidney evaluation - eGFR measurement  01/02/2024   HEMOGLOBIN A1C  01/16/2024   Medicare Annual Wellness (AWV)  08/05/2024   Colonoscopy  08/28/2026   DTaP/Tdap/Td (2 - Td or Tdap) 11/12/2026   Pneumonia Vaccine 62+ Years old  Completed   Hepatitis C Screening  Completed   HPV VACCINES  Aged Out    Health Maintenance  Health Maintenance Due  Topic Date Due   Lung Cancer Screening  01/12/2022   Meningococcal B Vaccine (3 of 5 - Increased Risk Bexsero 3-dose series) 03/02/2022   OPHTHALMOLOGY EXAM  06/21/2022   COVID-19 Vaccine (5 - 2024-25 season) 11/25/2022   Diabetic kidney  evaluation - Urine ACR  08/16/2023   Health Maintenance Items Addressed: None needed  Additional Screening:  Vision Screening: Recommended annual ophthalmology exams for early detection of glaucoma and other disorders of the eye.  Dental Screening: Recommended annual dental exams for proper oral hygiene  Community Resource Referral / Chronic Care Management: CRR required this visit?  No   CCM required this visit?  No   Plan:    I have personally reviewed and noted the following in the patient's chart:   Medical and social history Use of alcohol , tobacco or illicit drugs  Current medications and supplements including opioid prescriptions. Patient  is not currently taking opioid prescriptions. Functional ability and status Nutritional status Physical activity Advanced directives List of other physicians Hospitalizations, surgeries, and ER visits in previous 12 months Vitals Screenings to include cognitive, depression, and falls Referrals and appointments  In addition, I have reviewed and discussed with patient certain preventive protocols, quality metrics, and best practice recommendations. A written personalized care plan for preventive services as well as general preventive health recommendations were provided to patient.   Nerissa Bannister, LPN   0/12/2723   After Visit Summary: (MyChart) Due to this being a telephonic visit, the after visit summary with patients personalized plan was offered to patient via MyChart   Notes: Nothing significant to report at this time.

## 2023-08-06 NOTE — Patient Instructions (Signed)
 Travis [name] , Thank you for taking time out of your busy schedule to complete your Annual Wellness Visit with me. I enjoyed our conversation and look forward to speaking with you again next year. I, as well as your care team,  appreciate your ongoing commitment to your health goals. Please review the following plan we discussed and let me know if I can assist you in the future. Your Game plan/ To Do List     Follow up Visits: Next Medicare AWV with our clinical staff: 08/06/24 @ 8:50am televisit   Have you seen your provider in the last 6 months (3 months if uncontrolled diabetes)? Yes Next Office Visit with your provider: 01/16/2024  Clinician Recommendations:  Aim for 30 minutes of exercise or brisk walking, 6-8 glasses of water, and 5 servings of fruits and vegetables each day.       This is a list of the screening recommended for you and due dates:  Health Maintenance  Topic Date Due   Screening for Lung Cancer  01/12/2022   Meningitis B Vaccine (3 of 5 - Increased Risk Bexsero 3-dose series) 03/02/2022   Eye exam for diabetics  06/21/2022   COVID-19 Vaccine (5 - 2024-25 season) 11/25/2022   Yearly kidney health urinalysis for diabetes  08/16/2023   Zoster (Shingles) Vaccine (1 of 2) 01/18/2025*   Complete foot exam   08/16/2023   Flu Shot  10/25/2023   Yearly kidney function blood test for diabetes  01/02/2024   Hemoglobin A1C  01/16/2024   Medicare Annual Wellness Visit  08/05/2024   Colon Cancer Screening  08/28/2026   DTaP/Tdap/Td vaccine (2 - Td or Tdap) 11/12/2026   Pneumonia Vaccine  Completed   Hepatitis C Screening  Completed   HPV Vaccine  Aged Out  *Topic was postponed. The date shown is not the original due date.    Advanced directives: (In Chart) A copy of your advanced directives are scanned into your chart should your provider ever need it. Advance Care Planning is important because it:  [x]  Makes sure you receive the medical care that is consistent with your  values, goals, and preferences [x]  It provides guidance to your family and loved ones and reduces their decisional burden about whether or not they are making the right decisions based on your wishes.  Follow the link provided in your after visit summary or read over the paperwork we have mailed to you to help you started getting your Advance Directives in place. If you need assistance in completing these, please reach out to us  so that we can help you!

## 2023-09-20 ENCOUNTER — Telehealth: Payer: Self-pay | Admitting: Family

## 2023-09-20 NOTE — Telephone Encounter (Signed)
 Called to confirm/remind patient of their appointment [facility] on [facility].   Appointment:   [x] Confirmed  [] Left mess   [] No answer/No voice mail  [] VM Full/unable to leave message  [] Phone not in service  Patient reminded to bring all medications and/or complete list.  Confirmed patient has transportation. Gave directions, instructed to utilize valet parking.

## 2023-09-22 NOTE — Progress Notes (Unsigned)
 [facility] Note    PCP: Wendee Agent, NP (last seen 09/24) Primary Cardiologist: Harless Grass, MD (last seen 05/24) HF MD: Rolan Barrack, MD (last seen 08/24)  Chief Complaint: fatigue   HPI:  Travis [name] is a 68 y/o male with a history of osteoarthritis, hyperlipidemia, gout, HTN, GI bleed, type 2 DM, CAD, anemia, asthma, previous alcohol  use (quit 2022), central retinal artery occlusion left eye (2015), CKD, appendicitis 10/22 complicated by sepsis and splenic rupture needing splenectomy which resulted in a lengthy admission, LUE DVT, TIA, current tobacco use and chronic heart failure.  Has not been admitted or been in the ED in the last 6 months  Echo 11/09/19: EF of 45-50% along with mild LVH Echo 01/07/21: EF of 45-50% along with mild Travis. Echo 06/19/22: EF 25-30% with mild LVH, Grade I DD, mild Travis and mild LAE        RHC/ LHC 07/17/22:   Prox LAD to Mid LAD lesion is 60% stenosed.   1st Diag lesion is 40% stenosed.  1. Normal filling pressures and preserved cardiac output.  2. Moderate CAD with 60% calcified stenosis proximal LAD/D1.  I do not think this is flow limiting and does not explain fall in EF.     cMRI 10/11/22: 1.  Normal LV size with diffuse hypokinesis, EF 41%.  2.  Normal RV size with mildly decreased systolic function, EF 49%.  3. Subtle mid-wall LGE in the basal septum, this is nonspecific and often seen with nonischemic cardiomyopathies.  4. Mildly elevated extracellular volume percentage, suggesting increased myocardial fibrosis.  He presents today for a HF f/u visit with a chief complaint of minimal fatigue with moderate exertion. Chronic in nature. Has associated intermittent dizziness along with this. Reports sleeping well on 1-2 pillows. Denies shortness of breath, chest pain, cough, palpitations, abdominal distention, pedal edema or weight gain. Did not take his medications yet this morning because he woke up late. He did take a  decongestant this morning.   Previous cardiac studies:  Echo 2015: EF 45-50%. Echo 01/26/15: EF of 25-30% along with trivial Travis/ TR.  LHC 01/28/15:  10% stenosis in Prox RCA to Mid RCA and 30% stenosis in Prox LAD to Mid LAD which is not much different from previous angiography.  Cardiomyopathy felt to be due to previous alcohol  use.   Echo 08/08/16: EF of 35-40%. Echo 06/04/19: EF of 35-40% along with mild LVH.  ROS: All systems negative except as listed in HPI, PMH and Problem List.  SH:  Social History   Socioeconomic History   Marital status: Single    Spouse name: Not on file   Number of children: 0   Years of education: 12   Highest education level: High school graduate  Occupational History   Occupation: Retired  Tobacco Use   Smoking status: Former    Current packs/day: 0.00    Average packs/day: 0.8 packs/day for 30.0 years (22.5 ttl pk-yrs)    Types: Cigarettes    Start date: 12/1990    Quit date: 12/2020    Years since quitting: 2.7   Smokeless tobacco: Never  Vaping Use   Vaping status: Never Used  Substance and Sexual Activity   Alcohol  use: Not Currently   Drug use: Not Currently   Sexual activity: Yes    Birth control/protection: Condom  Other Topics Concern   Not on file  Social History Narrative   Not on file   Social Drivers of Health  Financial Resource Strain: Low Risk  (08/06/2023)   Overall Financial Resource Strain (CARDIA)    Difficulty of Paying Living Expenses: Not hard at all  Food Insecurity: No Food Insecurity (08/06/2023)   Hunger Vital Sign    Worried About Running Out of Food in the Last Year: Never true    Ran Out of Food in the Last Year: Never true  Transportation Needs: No Transportation Needs (08/06/2023)   PRAPARE - Administrator, Civil Service (Medical): No    Lack of Transportation (Non-Medical): No  Physical Activity: Insufficiently Active (08/06/2023)   Exercise Vital Sign    Days of Exercise per Week: 7  days    Minutes of Exercise per Session: 20 min  Stress: No Stress Concern Present (08/06/2023)   [employer] of Occupational Health - Occupational Stress Questionnaire    Feeling of Stress : Only a little  Social Connections: Unknown (08/06/2023)   Social Connection and Isolation Panel    Frequency of Communication with Friends and Family: Twice a week    Frequency of Social Gatherings with Friends and Family: Never    Attends Religious Services: 1 to 4 times per year    Active Member of [employer] or Organizations: No    Attends Banker Meetings: Never    Marital Status: Not on file  Intimate Partner Violence: Not At Risk (08/06/2023)   Humiliation, Afraid, Rape, and Kick questionnaire    Fear of Current or Ex-Partner: No    Emotionally Abused: No    Physically Abused: No    Sexually Abused: No    FH:  Family History  Problem Relation Age of Onset   Diabetes Mother    Hypertension Mother    Diabetes Father    Hypertension Father    Diabetes Sister    Dementia Brother    Cancer Neg Hx    Heart disease Neg Hx    Stroke Neg Hx    Colon cancer Neg Hx    Esophageal cancer Neg Hx    Rectal cancer Neg Hx    Stomach cancer Neg Hx     Past Medical History:  Diagnosis Date   Alcohol  abuse    Alcoholic cardiomyopathy (HCC) 87/78/7985   a. 05/2019 Echo: EF 35-40%; b. 10/2019 Echo: EF 45-50%.   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 12/2010 Echo: EF 20-25%; b 08/2013 Echo: EF 45-50%; c. 01/2015 Echo: EF 25-30%; d. 07/2016 Echo: EF 35-40%; e. 05/2019 Echo: EF 35-40%; d. 10/2019 Echo: EF 45-50%, mild LVH, mild red RV fxn, mildl dil RA, Triv Travis. Mild to mod Ao Sclerosis w/o  stenosis.   Hyperlipidemia    Hypertension    Nonischemic cardiomyopathy (HCC)    Nonobstructive Coronary artery disease    a. 01/2015 Cath: LM nl, LAD 30p/m, LCX nl, RCA 10p/m, RPDA min irregs.   Osteoarthritis    Pneumonia    TIA (transient ischemic attack)    Tobacco abuse    Vision loss    peripherial vision only left eye.Central Retinal  artery occusion  Current Outpatient Medications  Medication Sig Dispense Refill   acetaminophen  (TYLENOL ) 500 MG tablet Take 2 tablets (1,000 mg total) by mouth every 8 (eight) hours as needed. 30 tablet 0   albuterol  (VENTOLIN  HFA) 108 (90 Base) MCG/ACT inhaler Inhale 2 puffs into the lungs every 6 (six) hours as needed for wheezing or shortness of breath.     allopurinol  (ZYLOPRIM ) 100 MG tablet TAKE ONE TABLET BY MOUTH TWICE A DAY 180 tablet 1   ASPIRIN  81 PO      atorvastatin  (LIPITOR) 80 MG tablet Take 1 tablet (80 mg total) by mouth daily. 90 tablet 3   Blood Pressure KIT Check b/p once daily. Based on insurance preference 1 kit 0   carvedilol  (COREG ) 12.5 MG tablet Take 1 tablet (12.5 mg total) by mouth 2 (two) times daily with a meal. 180 tablet 3   cetirizine  (ZYRTEC ) 10 MG tablet TAKE ONE TABLET BY MOUTH ONCE DAILY 90 tablet 3   diphenhydrAMINE  (BENADRYL ) 25 MG tablet Take 25 mg by mouth daily as needed for allergies.     Evolocumab  (REPATHA  SURECLICK) 140 MG/ML SOAJ Inject 140 mg into the skin every 14 (fourteen) days. 6 mL 3   ezetimibe  (ZETIA ) 10 MG tablet Take 1 tablet (10 mg total) by mouth daily. 30 tablet 11   fenofibrate  (TRICOR ) 145 MG tablet TAKE ONE TABLET BY MOUTH ONCE A DAY 90 tablet 1   ferrous sulfate  325 (65 FE) MG tablet Take 1 tablet (325 mg total) by mouth daily with breakfast. 90 tablet 2   fluticasone  (FLONASE ) 50 MCG/ACT nasal spray Place 1-2 sprays into both nostrils daily as needed for allergies or rhinitis.     glimepiride  (AMARYL ) 1 MG tablet TAKE ONE TABLET BY MOUTH EVERY MORNING WITH BREAKFAST 90 tablet 1    hydroxypropyl methylcellulose / hypromellose (ISOPTO TEARS / GONIOVISC) 2.5 % ophthalmic solution Place 1 drop into both eyes 2 (two) times daily.     magnesium  oxide (MAG-OX) 400 MG tablet TAKE ONE TABLET (400 MG TOTAL) BY MOUTH DAILY. 90 tablet 1   metFORMIN  (GLUCOPHAGE ) 500 MG tablet TAKE TWO TABLETS BY MOUTH TWICE A DAY WITH A MEAL 360 tablet 0   multivitamin (ONE-A-DAY MEN'S) TABS tablet Take 1 tablet by mouth daily with breakfast.     Omega-3 Fatty Acids (FISH OIL  PO) Take 1 capsule by mouth daily.     pantoprazole  (PROTONIX ) 40 MG tablet TAKE ONE TABLET BY MOUTH ONCE A DAY 90 tablet 1   predniSONE  (DELTASONE ) 10 MG tablet Take 10 mg by mouth daily with breakfast.     sacubitril -valsartan  (ENTRESTO ) 49-51 MG Take 1 tablet by mouth 2 (two) times daily. 180 tablet 3   spironolactone  (ALDACTONE ) 25 MG tablet Take 1 tablet (25 mg total) by mouth daily. 90 tablet 3   No current facility-administered medications for this visit.   There were no vitals filed for this visit.  Wt Readings from Last 3 Encounters:  08/06/23 191 lb (86.6 kg)  07/17/23 191 lb 9.6 oz (86.9 kg)  03/26/23 199 lb (90.3 kg)   Lab Results  Component Value Date   CREATININE 1.24 01/02/2023   CREATININE 1.28 12/19/2022   CREATININE 1.35 (H) 11/23/2022   PHYSICAL EXAM:  General:  Well appearing. No resp difficulty HEENT: normal Neck: supple. JVP flat. No lymphadenopathy or thryomegaly appreciated. Cor: PMI normal. Regular rate & rhythm. No rubs, gallops or murmurs. Lungs: clear Abdomen: soft, nontender, nondistended. No hepatosplenomegaly. No bruits or masses.  Extremities: no  cyanosis, clubbing, rash, edema Neuro: alert & oriented x3, cranial nerves grossly intact. Moves all 4 extremities w/o difficulty. Affect pleasant.   ECG: not done   ASSESSMENT & PLAN:  1: NICM with reduced ejection fraction- - suspect due to previous heavy alcohol  use as cath showed nonobstructive CAD - NYHA class II - euvolemic  today - weighing daily; reminded to call for an overnight weight gain of >2 pounds/weekly weight gain of >5 pounds.  - weight up 4 pounds from last visit here 6 weeks ago - Echo 11/09/19: EF of 45-50% along with mild LVH - Echo 01/07/21: EF of 45-50% along with mild Travis. - Echo 06/19/22: EF 25-30% with mild LVH, Grade I DD, mild Travis and mild LAE     - cMRI 10/11/22:   1.  Normal LV size with diffuse hypokinesis, EF 41%.    2.  Normal RV size with mildly decreased systolic function, EF 49%.    3. Subtle mid-wall LGE in the basal septum, this is nonspecific and   often seen with nonischemic cardiomyopathies.    4. Mildly elevated extracellular volume percentage, suggesting increased myocardial fibrosis. - EF out of range for ICD on 07/24 cMRI. Narrow QRS so not candidate for CRT. - sometimes cooks with salt - drinking approximately 64 oz of fluid daily - continue carvedilol  12.5mg  BID - continue entresto  49/51mg  BID; titrate up if able although he has been hypotensive in the past - continue spironolactone  25mg  daily  - tried farxiga  in the past but he felt terrible on it - palliative care visit done 11/06/21 - BNP 09/19/22 was 26.2  2: HTN- - BP 155/80 although he hasn't taken his medications today and took a decongestant this morning - will take his medications when he returns home - saw PCP Stacie) 09/24 - BMP 01/02/23 reviewed and shows sodium 136, potassium 4.2, creatinine 1.24 and GFR >60 - saw nephrology Kolleen) 09/22  3: Hyperlipidemia- - continues to not smoke - LDL 01/02/23 was 71 - continue atorvastatin  80mg  daily - continue zetia  10mg  daily - continue fenofibrate  145mg  daily - went to lipid clinic 10/24 and started repatha  injection every 2 weeks  4: DM- - checks blood glucose twice a week - A1c 08/16/22 was 6.7% - continue glimepiride  1mg  daily - continue metformin  500mg  BID  5: CAD- - saw cardiology Marsa) 05/24 - continue ASA 81mg  daily - continue atorvastatin  80mg   daily - RHC/ LHC 07/17/22:   Prox LAD to Mid LAD lesion is 60% stenosed.   1st Diag lesion is 40% stenosed.    1. Normal filling pressures and preserved cardiac output.    2. Moderate CAD with 60% calcified stenosis proximal LAD/D1. Doubt this flow limiting and does not explain fall in EF.      Return in 6 months, sooner if needed.      Ellouise DELENA Class, FNP 09/22/23

## 2023-09-23 ENCOUNTER — Encounter: Payer: Self-pay | Admitting: Family

## 2023-09-23 ENCOUNTER — Ambulatory Visit: Payer: 59 | Attending: Family | Admitting: Family

## 2023-09-23 ENCOUNTER — Other Ambulatory Visit: Payer: Self-pay | Admitting: Nurse Practitioner

## 2023-09-23 VITALS — BP 129/83 | HR 72 | Wt 189.2 lb

## 2023-09-23 DIAGNOSIS — I428 Other cardiomyopathies: Secondary | ICD-10-CM | POA: Insufficient documentation

## 2023-09-23 DIAGNOSIS — M1990 Unspecified osteoarthritis, unspecified site: Secondary | ICD-10-CM | POA: Insufficient documentation

## 2023-09-23 DIAGNOSIS — Z8673 Personal history of transient ischemic attack (TIA), and cerebral infarction without residual deficits: Secondary | ICD-10-CM | POA: Diagnosis not present

## 2023-09-23 DIAGNOSIS — E785 Hyperlipidemia, unspecified: Secondary | ICD-10-CM | POA: Diagnosis not present

## 2023-09-23 DIAGNOSIS — N189 Chronic kidney disease, unspecified: Secondary | ICD-10-CM | POA: Diagnosis not present

## 2023-09-23 DIAGNOSIS — N1831 Chronic kidney disease, stage 3a: Secondary | ICD-10-CM | POA: Diagnosis not present

## 2023-09-23 DIAGNOSIS — I5022 Chronic systolic (congestive) heart failure: Secondary | ICD-10-CM | POA: Diagnosis not present

## 2023-09-23 DIAGNOSIS — I10 Essential (primary) hypertension: Secondary | ICD-10-CM

## 2023-09-23 DIAGNOSIS — D631 Anemia in chronic kidney disease: Secondary | ICD-10-CM | POA: Diagnosis not present

## 2023-09-23 DIAGNOSIS — F17210 Nicotine dependence, cigarettes, uncomplicated: Secondary | ICD-10-CM | POA: Insufficient documentation

## 2023-09-23 DIAGNOSIS — I2510 Atherosclerotic heart disease of native coronary artery without angina pectoris: Secondary | ICD-10-CM | POA: Insufficient documentation

## 2023-09-23 DIAGNOSIS — J45909 Unspecified asthma, uncomplicated: Secondary | ICD-10-CM | POA: Diagnosis not present

## 2023-09-23 DIAGNOSIS — Z7982 Long term (current) use of aspirin: Secondary | ICD-10-CM | POA: Diagnosis not present

## 2023-09-23 DIAGNOSIS — M109 Gout, unspecified: Secondary | ICD-10-CM | POA: Diagnosis not present

## 2023-09-23 DIAGNOSIS — Z86718 Personal history of other venous thrombosis and embolism: Secondary | ICD-10-CM | POA: Diagnosis not present

## 2023-09-23 DIAGNOSIS — Z7984 Long term (current) use of oral hypoglycemic drugs: Secondary | ICD-10-CM | POA: Insufficient documentation

## 2023-09-23 DIAGNOSIS — E1122 Type 2 diabetes mellitus with diabetic chronic kidney disease: Secondary | ICD-10-CM | POA: Diagnosis not present

## 2023-09-23 DIAGNOSIS — E1165 Type 2 diabetes mellitus with hyperglycemia: Secondary | ICD-10-CM

## 2023-09-23 DIAGNOSIS — I130 Hypertensive heart and chronic kidney disease with heart failure and stage 1 through stage 4 chronic kidney disease, or unspecified chronic kidney disease: Secondary | ICD-10-CM | POA: Insufficient documentation

## 2023-09-23 DIAGNOSIS — Z79899 Other long term (current) drug therapy: Secondary | ICD-10-CM | POA: Insufficient documentation

## 2023-09-23 NOTE — Patient Instructions (Addendum)
 Medication Changes:  No medication changes.   Lab Work:  Please have your echo completed on Wednesday, September 3rd at 9:00 AM. You will check in for this at the [facility]. You have to arrive 15 MINS EARLY for preparation, otherwise you will have to reschedule.   Testing/Procedures:  Please have your echo completed. You will check in for this at the [facility]. You have to arrive 15 MINS EARLY for preparation, otherwise you will have to reschedule.   Follow-Up with Dr. Rolan after your echo.  At the [facility], you and your health needs are our priority. We have a designated team specialized in the treatment of Heart Failure. This Care Team includes your primary Heart Failure Specialized Cardiologist (physician), Advanced Practice Providers (APPs- Physician Assistants and Nurse Practitioners), and Pharmacist who all work together to provide you with the care you need, when you need it.   You may see any of the following providers on your designated Care Team at your next follow up:  Dr. Toribio Fuel Dr. Ezra Rolan Dr. Ria Commander Dr. Odis Brownie Ellouise Class, FNP Jaun Bash, RPH-CPP  Please be sure to bring in all your medications bottles to every appointment.   Need to Contact Us :  If you have any questions or concerns before your next appointment please send us  a message through [place] or call our office at [phone].    TO LEAVE A MESSAGE FOR THE NURSE SELECT OPTION 2, PLEASE LEAVE A MESSAGE INCLUDING: YOUR NAME DATE OF BIRTH CALL BACK NUMBER REASON FOR CALL**this is important as we prioritize the call backs  YOU WILL RECEIVE A CALL BACK THE SAME DAY AS LONG AS YOU CALL BEFORE 4:00 PM

## 2023-10-09 ENCOUNTER — Other Ambulatory Visit: Payer: Self-pay | Admitting: Nurse Practitioner

## 2023-11-12 NOTE — Progress Notes (Unsigned)
 Cardiology Office Note   Date:  11/14/2023  ID:  KYI [name], DOB 1955/12/16, MRN [id number] PCP: Wendee Lynwood HERO, NP  [facility] Providers Cardiologist:  Deatrice Cage, MD Cardiology APP:  Donette Ellouise LABOR, FNP     History of Present Illness Travis [name] is a 68 y.o. male with a past medical history of coronary artery disease, alcoholic cardiomyopathy with HFrEF with an LVEF of 45-50%, LVH, trivial mitral regurgitation echocardiogram (10/2019), type 2 diabetes, TIA, CKD stage III, chronic anemia with pancytopenia, hypertension, hyperlipidemia, gastric ulcer disease with prior GI bleed, and alcohol /tobacco abuse who presents today for follow-up of his cardiomyopathy.   Patient's cardiac history dates back to at least 2012 with a chart indicate he had an echocardiogram with an EF of 20 to 25% with global hypokinesis.  Repeat echocardiogram in 2013 showed an EF of 50 to 75%.  Echo in 01/2015 showed an EF of 25 to 30% with a decrease from prior echo in 2015 with an EF of 45-50%.  In 01/2015 he underwent diagnostic left heart catheterization which showed occluded proximal RCA to mid RCA along with 30% stenosis in the proximal and mid LAD which was largely unchanged from prior.  B.  His cardiomyopathy was felt to be due to prior alcohol  use.  Over the years he had had multiple admissions which included titration of medications related to hypotension and presyncope.  He had been seen in [facility] CHF clinic in 03/2020 and felt to be euvolemic with no medication changes being made.  He was seen in our office 05/2020 was found to be hypotensive with a blood pressure of 70/20 with complaints of dizziness.  He presented to the ED for evaluation of symptomatic hypotension.  He reported 15 pound weight loss since 02/2020 poor intake.  It was noted he had previously stopped drinking though he had restarted.  In the emergency department he was treated with IV Lasix .  High-sensitivity troponin was negative x  2.  He was noted to have an AKI with initial serum creatinine of 2.37 subsequently improving with hydration.  Entresto  and spironolactone  were placed on hold in the setting of hypotension and AKI.  During his admission his blood pressure remained soft leading to the continued holding of Entresto , spironolactone , beta-blocker.  At discharge it was recommended that he resume 3.125 mg twice daily.  He was seen in hospital follow-up 06/03/2020 he reported he been feeling well since hospital discharge.  He was taking his carvedilol  6.25 mg twice daily with continued soft blood pressure 110/80.  He had discontinued alcohol  again and reported improved appetite.  Given borderline soft blood pressure escalation of GDMT was deferred.  He was found to be euvolemic.  Entresto  was subsequently resumed on follow-up.  He was evaluated 04/2020 is doing well from a cardiac perspective.  Primary cardiologist did not recommend resumption of spironolactone  due to concerns of volume depletion.  Subsequent labs obtained 09/2020 showed a decline in his renal function with a serum creatinine of 2.06 with a baseline running around 1.2-1.9.  It was recommended Entresto  to be held.  He was referred to nephrology.  He was evaluated in clinic 10/2020 is doing well.  Dizziness improved following the discontinuation of Entresto .  He was noted to be taking spironolactone .  He continued to abstain from alcohol .  Given frequent acute on CKD spironolactone  was again discontinued.  He was placed on losartan  in place of Entresto  secondary to significant dizziness associated with Entresto .  Had  hospitalization from 10/9 through 02/09/2021 due to sepsis secondary to ruptured appendicitis status post exploratory laparotomy/ileocecectomy and drainage of intra-abdominal abscess with admission complicated by hemorrhagic shock due to spontaneous splenic rupture status post splenectomy as well as acute hypoxic respiratory failure with evidence of extubation,  acute left upper extremity DVT involving the left brachial vein, and pleural effusion status post ultrasound-guided thoracentesis gently with 600 cc of exudative fluid removal.  Echo was obtained during admission and demonstrated an LVEF of 45percent, global hypokinesis, normal RV systolic function and cavity size, mild MR, mild aortic valve sclerosis without evidence of stenosis.  He was evaluated in office 03/2021 without symptoms of angina or decompensation.  He had completed anticoagulation therapy with apixaban  for left upper extremity DVT.  He was abstaining from alcohol  and tobacco use.  He was referred to advanced heart failure and had been followed by Dr. Rolan.  He had undergone left heart catheterization in 06/2022 showed 60% pLAD stenosis, 40% D1, normal filling pressures with a cardiac index of 2.61.  Cardiac MRI completed in 7/24 with an EF of 41%, RVEF 49%, subtle mid wall LGE in the basal septal wall nonspecific.   He was last seen in advanced heart failure clinic in 09/23/2023.  At that today he had a complaint of fatigue and associated nasal congestion, right-sided axilla pain, rare dizziness.  He was euvolemic on exam.  Went to lipid clinic 10/24 and started Repatha .   He returns to clinic today stating that he has been doing well with no symptoms of angina or anginal equivalents or symptoms of decompensation from a heart failure standpoint.  States that appetite has been improved.  States that he has not had any emergency department visits in over 6 months.  States that he has been compliant with his current medication regimen with any undue side effects.  Repeat echocardiogram scheduled for September.  ROS: 10 point review of system has been reviewed considered negative except was been listed in the HPI  Studies Reviewed EKG Interpretation Date/Time:  Thursday November 14 2023 09:11:20 EDT Ventricular Rate:  67 PR Interval:  206 QRS Duration:  108 QT Interval:  420 QTC  Calculation: 443 R Axis:   -46  Text Interpretation: Normal sinus rhythm Left axis deviation Left ventricular hypertrophy with repolarization abnormality ( Cornell product ) When compared with ECG of 17-Jul-2022 06:51, No significant change was found Confirmed by Gerard Frederick ([id number]) on 11/14/2023 9:14:39 AM    cMRI 10/11/2022 IMPRESSION: 1.  Normal LV size with diffuse hypokinesis, EF 41%.   2.  Normal RV size with mildly decreased systolic function, EF 49%.   3. Subtle mid-wall LGE in the basal septum, this is nonspecific and often seen with nonischemic cardiomyopathies.   4. Mildly elevated extracellular volume percentage, suggesting increased myocardial fibrosis.   [facility] 07/17/2022   Prox LAD to Mid LAD lesion is 60% stenosed.   1st Diag lesion is 40% stenosed.   1. Normal filling pressures and preserved cardiac output.  2. Moderate CAD with 60% calcified stenosis proximal LAD/D1.  I do not think this is flow limiting and does not explain fall in EF.  He has no chest pain.    Suspect nonischemic cardiomyopathy.  Will arrange cardiac MRI, aggressive titration of HF meds.  With significant CAD, start ASA 81 and titrate up statin.    2D echo 06/19/2022 1. Left ventricular ejection fraction, by estimation, is 25 to 30%. The  left ventricle has severely decreased function. The left  ventricle  demonstrates global hypokinesis. The left ventricular internal cavity size  was mildly dilated. There is mild left  ventricular hypertrophy. Left ventricular diastolic parameters are  consistent with Grade I diastolic dysfunction (impaired relaxation).   2. Right ventricular systolic function is normal. The right ventricular  size is normal. Tricuspid regurgitation signal is inadequate for assessing  PA pressure.   3. Left atrial size was mildly dilated.   4. The mitral valve is normal in structure. Mild mitral valve  regurgitation. No evidence of mitral stenosis.   5. The aortic valve is  normal in structure. Aortic valve regurgitation is  not visualized. Aortic valve sclerosis/calcification is present, without  any evidence of aortic stenosis.   6. The inferior vena cava is normal in size with greater than 50%  respiratory variability, suggesting right atrial pressure of 3 mmHg.   2D echo 01/07/2021:  1. No change in EF compared to TTE done 11/09/19. Left ventricular  ejection fraction, by estimation, is 45 to 50%. The left ventricle has  mildly decreased function. The left ventricle demonstrates global  hypokinesis. Left ventricular diastolic parameters   were normal.   2. Right ventricular systolic function is normal. The right ventricular  size is normal.   3. The mitral valve is normal in structure. Mild mitral valve  regurgitation. No evidence of mitral stenosis.   4. The aortic valve was not well visualized. Aortic valve regurgitation  is not visualized. Mild aortic valve sclerosis is present, with no  evidence of aortic valve stenosis.   5. The inferior vena cava is normal in size with greater than 50%  respiratory variability, suggesting right atrial pressure of 3 mmHg.   2D echo 10/2019:  1. Compared to echo done in March 2021 EF improved and thickening of MV  and subchordal apparatus more prominent.   2. Left ventricular ejection fraction, by estimation, is 45 to 50%. The  left ventricle has mildly decreased function. The left ventricle  demonstrates regional wall motion abnormalities (see scoring  diagram/findings for description). There is mild left  ventricular hypertrophy. Left ventricular diastolic parameters are  indeterminate.   3. Enlarged basal annulus . Right ventricular systolic function is mildly  reduced. The right ventricular size is mildly enlarged.   4. Right atrial size was mildly dilated.   5. MV is thickened cannot r/o SBE but likely reducndant sub chordal  apparatus consider TEE to further evaluate if suspicion of SBE high . The  mitral  valve is abnormal. Trivial mitral valve regurgitation. No evidence  of mitral stenosis.   6. The aortic valve was not well visualized. Aortic valve regurgitation  is not visualized. Mild to moderate aortic valve sclerosis/calcification  is present, without any evidence of aortic stenosis.   7. The inferior vena cava is normal in size with greater than 50%  respiratory variability, suggesting right atrial pressure of 3 mmHg.    2D echo 05/2019: 1. Left ventricular ejection fraction, by estimation, is 35 to 40%. The  left ventricle has moderately decreased function. The left ventricle  demonstrates global hypokinesis. The left ventricular internal cavity size  was mildly dilated. There is mild  left ventricular hypertrophy. Left ventricular diastolic parameters are  consistent with Grade I diastolic dysfunction (impaired relaxation).   2. Right ventricular systolic function is normal. The right ventricular  size is normal. Tricuspid regurgitation signal is inadequate for assessing  PA pressure.   2D echo 07/2016: - Left ventricle: The cavity size was normal. There was  mild    concentric hypertrophy. Systolic function was moderately reduced.    The estimated ejection fraction was in the range of 35% to 40%.    Regional wall motion abnormalities cannot be excluded. Doppler    parameters are consistent with abnormal left ventricular    relaxation (grade 1 diastolic dysfunction).  - Mitral valve: Mild prolapse. There was trivial regurgitation.  - Right ventricle: The cavity size was normal. Systolic function    was normal.   LHC 01/2015: Prox RCA to Mid RCA lesion, 10% stenosed. Prox LAD to Mid LAD lesion, 30% stenosed.   1. Mild one-vessel coronary artery disease which does not seem to be different from most recent coronary angiography. 2. Moderate to severely reduced LV systolic function by noninvasive angiography. 3. Mildly elevated left ventricular end-diastolic pressure.    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 Continue medical therapy.   Lexiscan  MPI 01/2015: Defect 1: There is a medium defect of moderate severity present in the mid anterior, mid anterolateral, apical anterior and apex location. The defect is partially reversible. This could be due to an artifact. However, previous infarct with moderate peri-infarct ischemia cannot be excluded. ST segment depression was noted during stress. This was nondiagnostic due to abnormal baseline. This is a high risk study mainly due to severely reduced ejection fraction Nuclear stress EF: 28%. this study is overall very suboptimal due to intense GI activity interfering with the inferior wall. correlate clinically.   2D echo 01/2015: - Left ventricle: The cavity size was mildly dilated. Systolic    function was severely reduced. The estimated ejection fraction    was in the range of 25% to 30%. Diffuse hypokinesis. Regional    wall motion abnormalities cannot be excluded. Doppler parameters    are consistent with abnormal left ventricular relaxation (grade 1    diastolic dysfunction).  - Left atrium: The atrium was normal in size.  - Right ventricle: Systolic function was normal.  - Pulmonary arteries: Systolic pressure could not be estimated.   2D echo 08/2013: - Impressions: Limited study No doppler or M mode    Moderate LVE. No discrete RWMA;s    Diffuse hypokinesis worse in septum and inferior walls    EF 45-50%   Impressions:  - Limited study No doppler or M mode    Moderate LVE. No discrete RWMA;s    Diffuse hypokinesis worse in septum and inferior walls    EF 45-50%    2d echo 08/2013: - Left ventricle: DIfficult apical windows. LVEF not well    visualized in apical windows. LV systolic function appears to be    moderately depressed.l Would recomm repeat evaluation with    contrast to further define wall motion.  The cavity size was    mildly dilated. Wall thickness was normal.    2D echo 04/2011: - Left ventricle: The cavity size was normal. There was mild    focal basal andmild concentric hypertrophy. Systolic    function was normal. The estimated ejection fraction was    in the range of 50% to 55%. Basal inferior, posterior and    septal wall hypokinesis. Most of the myocardium has good    wall motion.  - Mitral valve: Anterior leaflet redundancy. Trivial    regurgitation. Mild prolapse of anterior leaflet.  - Right ventricle: Systolic function was normal.  - Pulmonary arteries: Systolic pressure was within the    normal range. Risk Assessment/Calculations  Physical Exam VS:  BP 120/60 (BP Location: Left Arm, Patient Position: Sitting, Cuff Size: Normal)   Pulse 67   Ht 6' 4 (1.93 m)   Wt 182 lb 12.8 oz (82.9 kg)   SpO2 99%   BMI 22.25 kg/m        Wt Readings from Last 3 Encounters:  11/14/23 182 lb 12.8 oz (82.9 kg)  09/23/23 189 lb 4 oz (85.8 kg)  08/06/23 191 lb (86.6 kg)    GEN: Well nourished, well developed in no acute distress NECK: No JVD; No carotid bruits CARDIAC: RRR, no murmurs, rubs, gallops RESPIRATORY:  Clear to auscultation without rales, wheezing or rhonchi  ABDOMEN: Soft, non-tender, non-distended EXTREMITIES:  No edema; No deformity   ASSESSMENT AND PLAN Coronary artery disease with last right and left heart catheterization completed 07/17/2022 revealing proximal LAD to mid LAD lesion 60% stenosis, first diagonal lesion 40% stenosis, normal filling pressures with preserved cardiac output, moderate CAD with 60% calcified stenosis proximal LAD/D1 that is likely not the cause of reduced EF.  EKG today reveals sinus rhythm with rate of 67 with first-degree AV block with LVH and early repull with no significant change from prior studies.  He has been continued on aspirin  81 mg daily, atorvastatin  80 mg daily, ezetimibe  10 mg daily and fenofibrate  145 mg  daily. No further ischemic testing needed at this time.  Chronic HFrEF/NICM, possibly due to prior EtOH abuse.  Show repeat heart catheterization 06/2022 showed nonobstructive CAD cardiac MRI on 7/24 revealed an EF of 41%, RVEF 49%, septal mid wall LGE in the basal septal wall.  Not volume overloaded on exam.  NYHA class I symptoms.  He is continued on carvedilol  12.5 mg twice daily, Entresto  49/51 mg twice daily, and spironolactone  25 mg daily.  He had to be taken off of Farxiga  for documented dehydration.  Entresto  was just increased and he has a follow-up lab work in additional 10 days.  Escalation of GDMT will need to be slow due to history of dehydration and orthostatic symptoms.  Has been encouraged to continue to follow with advanced heart failure clinic.  Hypertension with blood pressure today 120/60.  Blood pressure remains stable.  He has been continued on carvedilol  and Entresto  as well as spironolactone .  He has been encouraged to continue to monitor pressures 1 to 2 hours postmedication administration at home as well.  Hyperlipidemia with last LDL of 71.  He is continued on atorvastatin  80 mg daily and ezetimibe  10 mg daily as well as fenofibrate  145 mg daily.  Goal is 70 or less.  He does have an upcoming labs with his primary care provider.  Type 2 diabetes with last hemoglobin A1c of 6.2.  Ongoing management per PCP.        Dispo: Patient to return to clinic to see MD/APP in 11 to 12 months or sooner if needed for further evaluation.  Signed, Nic Lampe, NP

## 2023-11-14 ENCOUNTER — Ambulatory Visit: Attending: Cardiology | Admitting: Cardiology

## 2023-11-14 ENCOUNTER — Encounter: Payer: Self-pay | Admitting: Cardiology

## 2023-11-14 VITALS — BP 120/60 | HR 67 | Ht 76.0 in | Wt 182.8 lb

## 2023-11-14 DIAGNOSIS — E1122 Type 2 diabetes mellitus with diabetic chronic kidney disease: Secondary | ICD-10-CM

## 2023-11-14 DIAGNOSIS — E782 Mixed hyperlipidemia: Secondary | ICD-10-CM

## 2023-11-14 DIAGNOSIS — N1831 Chronic kidney disease, stage 3a: Secondary | ICD-10-CM

## 2023-11-14 DIAGNOSIS — I10 Essential (primary) hypertension: Secondary | ICD-10-CM | POA: Diagnosis not present

## 2023-11-14 DIAGNOSIS — I5022 Chronic systolic (congestive) heart failure: Secondary | ICD-10-CM

## 2023-11-14 DIAGNOSIS — I2510 Atherosclerotic heart disease of native coronary artery without angina pectoris: Secondary | ICD-10-CM

## 2023-11-14 NOTE — Patient Instructions (Signed)
 Medication Instructions:   Your physician recommends that you continue on your current medications as directed. Please refer to the Current Medication list given to you today.   *If you need a refill on your cardiac medications before your next appointment, please call your pharmacy*  Lab Work:  No labs ordered today   If you have labs (blood work) drawn today and your tests are completely normal, you will receive your results only by: MyChart Message (if you have MyChart) OR A paper copy in the mail If you have any lab test that is abnormal or we need to change your treatment, we will call you to review the results.  Testing/Procedures:  No test ordered today   Follow-Up: At [facility], you and your health needs are our priority.  As part of our continuing mission to provide you with exceptional heart care, our providers are all part of one team.  This team includes your primary Cardiologist (physician) and Advanced Practice Providers or APPs (Physician Assistants and Nurse Practitioners) who all work together to provide you with the care you need, when you need it.  Your next appointment:    12 month(s)  Provider:   You may see Deatrice Cage, MD or one of the following Advanced Practice Providers on your designated Care Team:   Lonni Meager, NP Lesley Maffucci, PA-C Bernardino Bring, PA-C Cadence Hoquiam, PA-C Tylene Lunch, NP Barnie Hila, NP

## 2023-11-19 ENCOUNTER — Other Ambulatory Visit: Payer: Self-pay | Admitting: Nurse Practitioner

## 2023-11-27 ENCOUNTER — Ambulatory Visit: Admission: RE | Admit: 2023-11-27 | Source: Ambulatory Visit

## 2023-12-06 ENCOUNTER — Telehealth: Payer: Self-pay | Admitting: Cardiology

## 2023-12-06 NOTE — Telephone Encounter (Signed)
 Called to confirm/remind patient of their appointment at the Advanced Heart Failure Clinic on 12/09/23.   Appointment:   [x] Confirmed  [] Left mess   [] No answer/No voice mail  [] VM Full/unable to leave message  [] Phone not in service  Patient reminded to bring all medications and/or complete list.  Confirmed patient has transportation. Gave directions, instructed to utilize valet parking.

## 2023-12-09 ENCOUNTER — Ambulatory Visit (HOSPITAL_BASED_OUTPATIENT_CLINIC_OR_DEPARTMENT_OTHER): Admitting: Cardiology

## 2023-12-09 ENCOUNTER — Ambulatory Visit (HOSPITAL_COMMUNITY): Payer: Self-pay | Admitting: Cardiology

## 2023-12-09 ENCOUNTER — Other Ambulatory Visit
Admission: RE | Admit: 2023-12-09 | Discharge: 2023-12-09 | Disposition: A | Discharge status: Home / Self Care | Source: Ambulatory Visit | Attending: Cardiology | Admitting: Cardiology

## 2023-12-09 VITALS — BP 151/85 | HR 73 | Wt 183.0 lb

## 2023-12-09 DIAGNOSIS — I5022 Chronic systolic (congestive) heart failure: Secondary | ICD-10-CM | POA: Diagnosis not present

## 2023-12-09 LAB — BASIC METABOLIC PANEL WITH GFR
Anion gap: 11 (ref 5–15)
BUN: 25 mg/dL — ABNORMAL HIGH (ref 8–23)
CO2: 24 mmol/L (ref 22–32)
Calcium: 9.5 mg/dL (ref 8.9–10.3)
Chloride: 106 mmol/L (ref 98–111)
Creatinine, Ser: 1.41 mg/dL — ABNORMAL HIGH (ref 0.61–1.24)
GFR, Estimated: 55 mL/min — ABNORMAL LOW (ref >60)
Glucose, Bld: 91 mg/dL (ref 70–99)
Potassium: 5.1 mmol/L (ref 3.5–5.1)
Sodium: 141 mmol/L (ref 135–145)

## 2023-12-09 LAB — LIPID PANEL
Cholesterol: 93 mg/dL (ref 0–200)
HDL: 40 mg/dL — ABNORMAL LOW (ref >40)
LDL Cholesterol: 37 mg/dL (ref 0–99)
Total CHOL/HDL Ratio: 2.3 ratio
Triglycerides: 79 mg/dL (ref <150)
VLDL: 16 mg/dL (ref 0–40)

## 2023-12-09 LAB — BRAIN NATRIURETIC PEPTIDE: B Natriuretic Peptide: 37.3 pg/mL (ref 0.0–100.0)

## 2023-12-09 MED ORDER — SACUBITRIL-VALSARTAN 97-103 MG PO TABS: 1.0000 | ORAL_TABLET | Freq: Two times a day (BID) | ORAL | 11 refills | Status: AC

## 2023-12-09 NOTE — Progress Notes (Addendum)
 PCP: Wendee Lynwood HERO, NP HF Cardiology: Dr. Rolan  Chief complaint: CHF  68 y.o. with history of CHF, HTN, CKD stage 3 who was referred to HF MD by Ellouise Class.  Patient has a long history of cardiomyopathy.  In 11/16, echo was done showing EF 25-30%.  Cath at that time showed nonobstructive CAD.  Over the years, his LV function improved, with echoes in 2021 and 2022 showing EF 45-50%. He was a prior heavy drinker but quit several years ago. He is also a prior smoker. ETOH was thought to contribute to his cardiomyopathy. He had a long admission in 10/22 with ruptured appendicitis and sepsis.  This admission was also complicated by splenic rupture and splenectomy.  GDMT in recent years appears to have been limited by low BP and renal dysfunction.    In 1/24, he had an echo done that showed EF 25-30% with mild LV dilation and mild LVH. This was a signficant fall from previous years.  He did not report any significant changes around this time. LHC/RHC in 4/24 showed 60% pLAD stenosis, 40% D1; normal filling pressures and CI 2.61.   Patient currently does not smoke cigarettes or drink, he does use marijuana occasionally.  No other drugs.    Cardiac MRI (7/24) with EF 41%, RVEF 49%, subtle mid-wall LGE in the basal septal wall (nonspecific), ECV 33%.   He returns for followup of CHF.  Weight down 6 lbs.  He has some generalized fatigue but denies exertional dyspnea or chest pain.  No orthopnea/PND.  No lightheadedness.  He is on prednisone  10 mg daily but is not sure why.  No current gout pain.   ECG (8/25, personally reviewed): NSR, nonspecific inferolateral TWIs.   Labs (2/24): K 4.3, creatinine 1.42 Labs (5/24): K 4.1 => 4.8, creatinine 1.57 => 1.55, LDL 80 Labs (6/24): K 4.5, creatinine 1.4, BNP 26 Labs (10/24): LDL 71  PMH: 1. Hyperlipidemia 2. Gout 3. HTN 4. GI bleeding 5. Type 2 diabetes 6. H/o ETOH abuse: Quit drinking in 2022.  7. Central retinal artery occlusion: Left eye, 2015.   8. CKD stage 3 9. Appendicitis: 10/22 hospitalization with ruptured appendix, also complicated by splenic rupture needing splenectomy.  10. H/o LUE DVT: Thought to be related to PICC.  11. H/o TIA 12. Chronic systolic CHF: Echo in 11/16 with EF 25-30%, cath in 2016 and 4/24with nonobstructive CAD.  - Echo (5/18): EF 35-40%.  - Echo (3/21): EF 35-40% - Echo (8/21): EF 45-50% - Echo (10/22): EF 45-50%, mild MR - Echo (3/24): EF 25-30%, mild LV dilation, mild LVH, normal RV, mild MR.  - RHC (4/24): mean RA 5, PA 36/12, mean PCWP 9, CI 2.61 - Cardiac MRI (7/24) with EF 41%, RVEF 49%, subtle mid-wall LGE in the basal septal wall (nonspecific), ECV 33%.  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 CAD: Cath (4/24) with 60% pLAD, 40% D1.   Social History   Socioeconomic History   Marital status: Single    Spouse name: Not on file   Number of children: 0   Years of education: 12   Highest education level: High school graduate  Occupational History   Occupation: Retired  Tobacco Use   Smoking status: Former    Current packs/day: 0.00    Average packs/day: 0.8 packs/day for 30.0 years (22.5 ttl pk-yrs)    Types: Cigarettes    Start date: 12/1990    Quit date: 12/2020    Years since quitting: 2.9  Smokeless tobacco: Never  Vaping Use   Vaping status: Never Used  Substance and Sexual Activity   Alcohol  use: Not Currently   Drug use: Not Currently   Sexual activity: Yes    Birth control/protection: Condom  Other Topics Concern   Not on file  Social History Narrative   Not on file   Social Drivers of Health   Financial Resource Strain: Low Risk  (08/06/2023)   Overall Financial Resource Strain (CARDIA)    Difficulty of Paying Living Expenses: Not hard at all  Food Insecurity: No Food Insecurity (08/06/2023)   Hunger Vital Sign    Worried About Running Out of Food in the Last Year: Never true    Ran Out of Food in the Last Year: Never true  Transportation Needs: No  Transportation Needs (08/06/2023)   PRAPARE - Administrator, Civil Service (Medical): No    Lack of Transportation (Non-Medical): No  Physical Activity: Insufficiently Active (08/06/2023)   Exercise Vital Sign    Days of Exercise per Week: 7 days    Minutes of Exercise per Session: 20 min  Stress: No Stress Concern Present (08/06/2023)   [employer] of Occupational Health - Occupational Stress Questionnaire    Feeling of Stress : Only a little  Social Connections: Unknown (08/06/2023)   Social Connection and Isolation Panel    Frequency of Communication with Friends and Family: Twice a week    Frequency of Social Gatherings with Friends and Family: Never    Attends Religious Services: 1 to 4 times per year    Active Member of [employer] or Organizations: No    Attends Banker Meetings: Never    Marital Status: Not on file  Intimate Partner Violence: Not At Risk (08/06/2023)   Humiliation, Afraid, Rape, and Kick questionnaire    Fear of Current or Ex-Partner: No    Emotionally Abused: No    Physically Abused: No    Sexually Abused: No   Family History  Problem Relation Age of Onset   Diabetes Mother    Hypertension Mother    Diabetes Father    Hypertension Father    Diabetes Sister    Dementia Brother    Cancer Neg Hx    Heart disease Neg Hx    Stroke Neg Hx    Colon cancer Neg Hx    Esophageal cancer Neg Hx    Rectal cancer Neg Hx    Stomach cancer Neg Hx    ROS: All systems reviewed and negative except as per HPI.   Current Outpatient Medications on File Prior to Visit  Medication Sig Dispense Refill   acetaminophen  (TYLENOL ) 500 MG tablet Take 2 tablets (1,000 mg total) by mouth every 8 (eight) hours as needed. 30 tablet 0   albuterol  (VENTOLIN  HFA) 108 (90 Base) MCG/ACT inhaler Inhale 2 puffs into the lungs every 6 (six) hours as needed for wheezing or shortness of breath.     allopurinol  (ZYLOPRIM ) 100 MG tablet TAKE ONE TABLET BY MOUTH TWICE A  DAY 180 tablet 1   ASPIRIN  81 PO      atorvastatin  (LIPITOR) 80 MG tablet Take 1 tablet (80 mg total) by mouth daily. 90 tablet 3   Blood Pressure KIT Check b/p once daily. Based on insurance preference 1 kit 0   carvedilol  (COREG ) 12.5 MG tablet Take 1 tablet (12.5 mg total) by mouth 2 (two) times daily with a meal. 180 tablet 3   cetirizine  (ZYRTEC ) 10 MG  tablet TAKE ONE TABLET BY MOUTH ONCE DAILY 90 tablet 3   diphenhydrAMINE  (BENADRYL ) 25 MG tablet Take 25 mg by mouth daily as needed for allergies.     Evolocumab  (REPATHA  SURECLICK) 140 MG/ML SOAJ Inject 140 mg into the skin every 14 (fourteen) days. 6 mL 3   ezetimibe  (ZETIA ) 10 MG tablet Take 1 tablet (10 mg total) by mouth daily. 30 tablet 11   fenofibrate  (TRICOR ) 145 MG tablet TAKE ONE TABLET BY MOUTH ONCE A DAY 90 tablet 1   ferrous sulfate  325 (65 FE) MG tablet Take 1 tablet (325 mg total) by mouth daily with breakfast. 90 tablet 2   fluticasone  (FLONASE ) 50 MCG/ACT nasal spray Place 1-2 sprays into both nostrils daily as needed for allergies or rhinitis.     glimepiride  (AMARYL ) 1 MG tablet TAKE ONE TABLET BY MOUTH EVERY MORNING WITH BREAKFAST 90 tablet 1   hydroxypropyl methylcellulose / hypromellose (ISOPTO TEARS / GONIOVISC) 2.5 % ophthalmic solution Place 1 drop into both eyes 2 (two) times daily.     magnesium  oxide (MAG-OX) 400 MG tablet TAKE ONE TABLET (400 MG TOTAL) BY MOUTH DAILY. 90 tablet 1   metFORMIN  (GLUCOPHAGE ) 500 MG tablet TAKE TWO TABLETS BY MOUTH TWICE A DAY WITH A MEAL 360 tablet 1   multivitamin (ONE-A-DAY MEN'S) TABS tablet Take 1 tablet by mouth daily with breakfast.     Omega-3 Fatty Acids (FISH OIL  PO) Take 1 capsule by mouth daily.     pantoprazole  (PROTONIX ) 40 MG tablet TAKE ONE TABLET BY MOUTH ONCE A DAY 90 tablet 1   predniSONE  (DELTASONE ) 10 MG tablet Take 10 mg by mouth daily with breakfast.     spironolactone  (ALDACTONE ) 25 MG tablet Take 1 tablet (25 mg total) by mouth daily. 90 tablet 3   No  current facility-administered medications on file prior to visit.   BP (!) 151/85   Pulse 73   Wt 183 lb (83 kg)   SpO2 96%   BMI 22.28 kg/m  General: NAD Neck: No JVD, no thyromegaly or thyroid  nodule.  Lungs: Clear to auscultation bilaterally with normal respiratory effort. CV: Nondisplaced PMI.  Heart regular S1/S2, no S3/S4, no murmur.  No peripheral edema.  No carotid bruit.  Normal pedal pulses.  Abdomen: Soft, nontender, no hepatosplenomegaly, no distention.  Skin: Intact without lesions or rashes.  Neurologic: Alert and oriented x 3.  Psych: Normal affect. Extremities: No clubbing or cyanosis.  HEENT: Normal.   Assessment/Plan: 1. Chronic systolic CHF: Long-standing, thought to be nonischemic cardiomyopathy possibly due to prior ETOH.  Initial echo in 11/16 showed EF 25-30%, cath in 2016 with nonobstructive CAD.  By 2021 and 2022 echoes, EF had improved to 45-50%.  Echo done in 1/24 showed significant drop in EF to 25-30% with normal RV, mild MR.  He no longer drinks ETOH and has not smoked for several years.  Repeat cath in 4/24 showed nonobstructive CAD. Cardiac MRI (7/24) with EF 41%, RVEF 49%, subtle mid-wall LGE in the basal septal wall (nonspecific), ECV 33%.  Not volume overloaded on exam. NYHA class I-II.   - Continue Coreg  12.5 mg bid.  - Off Farxiga  due to dehydration, says he felt terrible on it and does not want to retry.   - Continue spironolactone  25 daily.   - Increase Entresto  to 97/103 bid, BMET/BNP today and BMET in 10 days.  - Will need slow and careful GDMT titration with history of dehydration and orthostasis.  - EF out of  range for ICD on 7/24 cMRI. Narrow QRS so not candidate for CRT.  I will arrange for echo to reassess LV function.  2. CKD stage 3: BMET today.  3. H/o DVT: Related to PICC line, no longer on anticoagulation.  4. CAD: Cath 4/24 with 60% pLAD, 40% D1.   - Continue ASA 81.  - Goal LDL < 55, check lipids today on atorvastatin  80 daily.    5. Prednisone  use: He is on prednisone  10 mg daily.  I am not sure why and he is not either.  ?gout.  I asked him to contact his PCP to see when/if he can stop this medication.   Followup 3 months.    I spent 31 minutes reviewing records, interviewing/examining patient, and managing orders.   Ezra Shuck 12/09/2023

## 2023-12-09 NOTE — Patient Instructions (Addendum)
 Medication Changes:  INCREASE ENTRESTO  TO 97-103 MG TWICE DAILY   Lab Work:  Go over to the MEDICAL MALL. Go pass the gift shop and have your blood work completed TODAY AND AGAIN IN 10 DAYS.  We will only call you if the results are abnormal or if the provider would like to make medication changes.  No news is good news.   Testing/Procedures:  Please have your echo completed. You will check in for this at [facility] on the LOWER LEVEL. You have to arrive 15 MINS EARLY for preparation, otherwise you will have to reschedule. They will call you to schedule this. Their number is [phone] if you prefer to reach out instead.   Special Instructions // Education:  Ask your Primary Care physician how long you are to continue taking Prednisone .   Follow-Up in: 3 MONTHS WITH DR. ROLAN.   Thank you for choosing [facility].    At the [facility], you and your health needs are our priority. We have a designated team specialized in the treatment of Heart Failure. This Care Team includes your primary Heart Failure Specialized Cardiologist (physician), Advanced Practice Providers (APPs- Physician Assistants and Nurse Practitioners), and Pharmacist who all work together to provide you with the care you need, when you need it.   You may see any of the following providers on your designated Care Team at your next follow up:  Dr. Toribio Fuel Dr. Ezra ROLAN Dr. Ria Commander Dr. Morene Brownie Ellouise Class, FNP Jaun Bash, RPH-CPP  Please be sure to bring in all your medications bottles to every appointment.   Need to Contact Us :  If you have any questions or concerns before your next appointment please send us  a message through [place] or call our office at [phone].    TO LEAVE A MESSAGE FOR THE NURSE SELECT OPTION 2, PLEASE LEAVE A MESSAGE INCLUDING: YOUR NAME DATE OF BIRTH CALL BACK NUMBER REASON FOR  CALL**this is important as we prioritize the call backs  YOU WILL RECEIVE A CALL BACK THE SAME DAY AS LONG AS YOU CALL BEFORE 4:00 PM

## 2023-12-18 ENCOUNTER — Other Ambulatory Visit
Admission: RE | Admit: 2023-12-18 | Discharge: 2023-12-18 | Disposition: A | Discharge status: Home / Self Care | Attending: Cardiology | Admitting: Cardiology

## 2023-12-18 ENCOUNTER — Ambulatory Visit (HOSPITAL_COMMUNITY): Payer: Self-pay | Admitting: Cardiology

## 2023-12-18 DIAGNOSIS — I5022 Chronic systolic (congestive) heart failure: Secondary | ICD-10-CM | POA: Insufficient documentation

## 2023-12-18 LAB — BASIC METABOLIC PANEL WITH GFR
Anion gap: 8 (ref 5–15)
BUN: 20 mg/dL (ref 8–23)
CO2: 26 mmol/L (ref 22–32)
Calcium: 9.3 mg/dL (ref 8.9–10.3)
Chloride: 106 mmol/L (ref 98–111)
Creatinine, Ser: 1.28 mg/dL — ABNORMAL HIGH (ref 0.61–1.24)
GFR, Estimated: >60 mL/min (ref >60)
Glucose, Bld: 98 mg/dL (ref 70–99)
Potassium: 4.7 mmol/L (ref 3.5–5.1)
Sodium: 140 mmol/L (ref 135–145)

## 2023-12-26 ENCOUNTER — Other Ambulatory Visit: Payer: Self-pay

## 2023-12-26 DIAGNOSIS — I5022 Chronic systolic (congestive) heart failure: Secondary | ICD-10-CM

## 2023-12-26 NOTE — Progress Notes (Signed)
 Changed echo order to [facility]

## 2024-01-14 NOTE — Progress Notes (Signed)
 HOGAN HOOBLER                                          MRN: [id number]   01/14/2024   The [facility] Quality Team Specialist reviewed this patient medical record for the purposes of chart review for care gap closure. The following were reviewed: abstraction for care gap closure-glycemic status assessment.    [facility]BCI Quality Team

## 2024-01-14 NOTE — Progress Notes (Signed)
 Travis [name]                                          MRN: [id number]   01/14/2024   The [facility]I Quality Team Specialist reviewed this patient medical record for the purposes of chart review for care gap closure. The following were reviewed: chart review for care gap closure-kidney health evaluation for diabetes:eGFR  and uACR.    [facility]BCI Quality Team

## 2024-01-16 ENCOUNTER — Encounter: Payer: Self-pay | Admitting: Nurse Practitioner

## 2024-01-16 ENCOUNTER — Ambulatory Visit: Admitting: Nurse Practitioner

## 2024-01-16 VITALS — BP 128/60 | HR 66 | Temp 98.2°F | Ht 72.5 in | Wt 186.2 lb

## 2024-01-16 DIAGNOSIS — M79641 Pain in right hand: Secondary | ICD-10-CM

## 2024-01-16 DIAGNOSIS — J439 Emphysema, unspecified: Secondary | ICD-10-CM

## 2024-01-16 DIAGNOSIS — R220 Localized swelling, mass and lump, head: Secondary | ICD-10-CM | POA: Diagnosis not present

## 2024-01-16 DIAGNOSIS — Z125 Encounter for screening for malignant neoplasm of prostate: Secondary | ICD-10-CM

## 2024-01-16 DIAGNOSIS — Z7984 Long term (current) use of oral hypoglycemic drugs: Secondary | ICD-10-CM | POA: Diagnosis not present

## 2024-01-16 DIAGNOSIS — I10 Essential (primary) hypertension: Secondary | ICD-10-CM

## 2024-01-16 DIAGNOSIS — Z0000 Encounter for general adult medical examination without abnormal findings: Secondary | ICD-10-CM

## 2024-01-16 DIAGNOSIS — Z8739 Personal history of other diseases of the musculoskeletal system and connective tissue: Secondary | ICD-10-CM

## 2024-01-16 DIAGNOSIS — E119 Type 2 diabetes mellitus without complications: Secondary | ICD-10-CM

## 2024-01-16 DIAGNOSIS — I5022 Chronic systolic (congestive) heart failure: Secondary | ICD-10-CM

## 2024-01-16 DIAGNOSIS — E785 Hyperlipidemia, unspecified: Secondary | ICD-10-CM

## 2024-01-16 DIAGNOSIS — Z87891 Personal history of nicotine dependence: Secondary | ICD-10-CM

## 2024-01-16 DIAGNOSIS — M545 Low back pain, unspecified: Secondary | ICD-10-CM

## 2024-01-16 DIAGNOSIS — Z122 Encounter for screening for malignant neoplasm of respiratory organs: Secondary | ICD-10-CM

## 2024-01-16 LAB — MICROALBUMIN / CREATININE URINE RATIO
Creatinine,U: 119.9 mg/dL
Microalb Creat Ratio: 12.2 mg/g (ref 0.0–30.0)
Microalb, Ur: 1.5 mg/dL (ref 0.0–1.9)

## 2024-01-16 LAB — COMPREHENSIVE METABOLIC PANEL WITH GFR
ALT: 16 U/L (ref 0–53)
AST: 19 U/L (ref 0–37)
Albumin: 4.7 g/dL (ref 3.5–5.2)
Alkaline Phosphatase: 54 U/L (ref 39–117)
BUN: 17 mg/dL (ref 6–23)
CO2: 30 meq/L (ref 19–32)
Calcium: 9.7 mg/dL (ref 8.4–10.5)
Chloride: 103 meq/L (ref 96–112)
Creatinine, Ser: 1.24 mg/dL (ref 0.40–1.50)
GFR: 59.95 mL/min — ABNORMAL LOW (ref >60.00)
Glucose, Bld: 78 mg/dL (ref 70–99)
Potassium: 4.8 meq/L (ref 3.5–5.1)
Sodium: 141 meq/L (ref 135–145)
Total Bilirubin: 0.5 mg/dL (ref 0.2–1.2)
Total Protein: 7.3 g/dL (ref 6.0–8.3)

## 2024-01-16 LAB — CBC WITH DIFFERENTIAL/PLATELET
Basophils Absolute: 0.1 K/uL (ref 0.0–0.1)
Basophils Relative: 1 % (ref 0.0–3.0)
Eosinophils Absolute: 0.9 K/uL — ABNORMAL HIGH (ref 0.0–0.7)
Eosinophils Relative: 11.3 % — ABNORMAL HIGH (ref 0.0–5.0)
HCT: 43.4 % (ref 39.0–52.0)
Hemoglobin: 14.1 g/dL (ref 13.0–17.0)
Lymphocytes Relative: 41.1 % (ref 12.0–46.0)
Lymphs Abs: 3.2 K/uL (ref 0.7–4.0)
MCHC: 32.6 g/dL (ref 30.0–36.0)
MCV: 98.3 fl (ref 78.0–100.0)
Monocytes Absolute: 0.5 K/uL (ref 0.1–1.0)
Monocytes Relative: 6.4 % (ref 3.0–12.0)
Neutro Abs: 3.1 K/uL (ref 1.4–7.7)
Neutrophils Relative %: 40.2 % — ABNORMAL LOW (ref 43.0–77.0)
Platelets: 443 K/uL — ABNORMAL HIGH (ref 150.0–400.0)
RBC: 4.42 Mil/uL (ref 4.22–5.81)
RDW: 16.2 % — ABNORMAL HIGH (ref 11.5–15.5)
WBC: 7.8 K/uL (ref 4.0–10.5)

## 2024-01-16 LAB — POCT GLYCOSYLATED HEMOGLOBIN (HGB A1C): Hemoglobin A1C: 5.8 % — AB (ref 4.0–5.6)

## 2024-01-16 LAB — PSA, MEDICARE: PSA: 0.84 ng/mL (ref 0.10–4.00)

## 2024-01-16 LAB — URIC ACID: Uric Acid, Serum: 4.3 mg/dL (ref 4.0–7.8)

## 2024-01-16 LAB — URINALYSIS, MICROSCOPIC ONLY

## 2024-01-16 LAB — TSH: TSH: 0.75 u[IU]/mL (ref 0.35–5.50)

## 2024-01-16 MED ORDER — TRAMADOL HCL 50 MG PO TABS: 50.0000 mg | ORAL_TABLET | Freq: Three times a day (TID) | ORAL | 0 refills | Status: AC | PRN

## 2024-01-16 NOTE — Assessment & Plan Note (Signed)
 Patient currently maintained on carvedilol  12.5 twice daily.  Currently followed by cardiology.  Blood pressure controlled continue

## 2024-01-16 NOTE — Patient Instructions (Signed)
 Nice to see you today I will be in touch with the labs once I have reviewed them Follow up with me in 6 months, sooner if you need me  Make an appointment when you leave with Dr. Watt  Call and schedule your ultrasound at  [facility] - off [facility] [address] [place] , [place] [address] [phone] Open 8am-5pm

## 2024-01-16 NOTE — Assessment & Plan Note (Signed)
 Patient currently maintained on Entresto , carvedilol  12.5 mg twice daily, ASA daily.  Patient was on spironolactone  but was discontinued by cardiology.

## 2024-01-16 NOTE — Assessment & Plan Note (Signed)
 History of the same and knee pain.  Will refer to sports medicine for further evaluation.  Patient was a Scientist, water quality and live.  He has tried Tylenol  without great relief.  We have drawn ANA and RF in the past that was negative.

## 2024-01-16 NOTE — Assessment & Plan Note (Signed)
 Discussed age-appropriate immunizations and screening exams.  Did review patient's personal, surgical, social, family histories.  Patient is up-to-date on all age-appropriate vaccinations.  Recently got his flu and COVID vaccine at the pharmacy.  Needs to update his shingles vaccine at local pharmacy.  Patient is up-to-date on CRC screening.  PSA today for prostate cancer screening.  Patient was referred to LDCT program for lung cancer screening.  Patient was given information at discharge about preventative healthcare maintenance with anticipatory guidance.

## 2024-01-16 NOTE — Assessment & Plan Note (Signed)
 History of the same.  Patient has albuterol  inhaler at home to use as needed.  Has not had to use in years.  Stable

## 2024-01-16 NOTE — Assessment & Plan Note (Signed)
 History of the pain we will refill tramadol  that he uses sparingly

## 2024-01-16 NOTE — Assessment & Plan Note (Signed)
 History of the same.  Patient is currently followed by cardiology.  Patient is maintained on atorvastatin  80 mg daily, Repatha  140 mg every 14 days, ezetimibe  10 mg daily and fenofibrate  145 mg daily.  Did review most recent lipid panel done by cardiology.  Continue medication as prescribed follow-up specialist as recommended

## 2024-01-16 NOTE — Assessment & Plan Note (Signed)
 Currently 10 on allopurinol  100 mg daily pending uric acid level today

## 2024-01-16 NOTE — Assessment & Plan Note (Signed)
 Patient currently maintained on 1000 mg metformin  twice daily and glimepiride  1 mg every morning.  Patient does not check glucose levels at home but has supplies if he needs it.  A1c well-controlled today at 5.8%.  No reports of hypoglycemia continue regimen as prescribed

## 2024-01-16 NOTE — Assessment & Plan Note (Signed)
 Likely a cyst on patient's inferior forehead left-sided.  Mobile nontender and soft.  Pending ultrasound.  Patient was given information to call and set up ultrasound at his convenience

## 2024-01-16 NOTE — Progress Notes (Signed)
 Established Patient Office Visit  Subjective   Patient ID: Travis [name], male    DOB: Jul 04, 1955  Age: 68 y.o. MRN: [id number]  Chief Complaint  Patient presents with   Annual Exam    HPI 5.8  CHF: Patient currently maintained on  Entresto , carvedilol  12.5 mg twice daily, aspirin  81 mg daily.  Patient is followed by cardiology  COPD: Patient currently maintained on albuterol  inhaler as needed. Has not had to use the albuterol  inaler in years  HLD: Patient currently maintained on rosuvastatin 80 mg daily, Repatha  140 mg every 14 days, fenofibrate  145 mg daily, ezetimibe  10 mg daily.  Patient is followed by cardiology  DM2: Patient currently maintained on metformin  500 mg 2 tablets twice daily, glimepiride  1 mg daily. He has not been checking his sugars at home   GERD: Patient currently maintained on Protonix  40 mg daily.Patient had upper endoscopy done on 11/25/2019.  Patient had LA grade a reflux esophagitis, erosive gastropathy, gastritis, small hiatal hernia, duodenal erosions.  Patient is to Protonix  40 mg twice daily for 2 weeks then 40 mg daily long-term  for complete physical and follow up of chronic conditions.  Immunizations: -Tetanus: Completed in 2018 -Influenza: Completed at pharmacy along with COVID booster at same time -Shingles: At local pharmacy -Pneumonia: Completed 2023  Diet: Fair diet. He is eating 3 meals a day. He will do breakfast will snack a t lunch and have a full diner. He is doing tea and lemonade and sometimes a soda Exercise: No regular exercise. He does walking around the park daily   Eye exam: Completes annually. Needs updating and wears glasses. Appt Monday   Dental exam:    Colonoscopy: Completed in 08/27/2016, repeat 10 years.  Patient will be due in 2028 Lung Cancer Screening: Needs ordered  PSA: Due  Sleep: going to bed around 1130 and getting up around 6-8. Feels rested. Does snore he ahs been told   Advance directive: has niece and  nephew.       Review of Systems  Constitutional:  Negative for chills and fever.  Respiratory:  Negative for shortness of breath.   Cardiovascular:  Negative for chest pain and leg swelling.  Gastrointestinal:  Negative for abdominal pain, blood in stool, constipation, diarrhea, nausea and vomiting.       Bm daily   Genitourinary:  Negative for dysuria and hematuria.  Musculoskeletal:  Positive for joint pain.  Skin:  Positive for rash.  Neurological:  Negative for tingling and headaches.  Psychiatric/Behavioral:  Negative for hallucinations and suicidal ideas.       Objective:     BP 128/60   Pulse 66   Temp 98.2 F (36.8 C) (Oral)   Ht 6' 0.5 (1.842 m)   Wt 186 lb 3.2 oz (84.5 kg)   SpO2 97%   BMI 24.91 kg/m  BP Readings from Last 3 Encounters:  01/16/24 128/60  12/09/23 (!) 151/85  11/14/23 120/60   Wt Readings from Last 3 Encounters:  01/16/24 186 lb 3.2 oz (84.5 kg)  12/09/23 183 lb (83 kg)  11/14/23 182 lb 12.8 oz (82.9 kg)   SpO2 Readings from Last 3 Encounters:  01/16/24 97%  12/09/23 96%  11/14/23 99%      Physical Exam Vitals and nursing note reviewed.  Constitutional:      Appearance: Normal appearance.  HENT:     Head:      Right Ear: Tympanic membrane, ear canal and external ear normal.  Left Ear: Tympanic membrane, ear canal and external ear normal.     Mouth/Throat:     Mouth: Mucous membranes are moist.     Pharynx: Oropharynx is clear.  Eyes:     Extraocular Movements: Extraocular movements intact.     Pupils: Pupils are equal, round, and reactive to light.  Cardiovascular:     Rate and Rhythm: Normal rate and regular rhythm.     Pulses: Normal pulses.     Heart sounds: Normal heart sounds.  Pulmonary:     Effort: Pulmonary effort is normal.     Breath sounds: Normal breath sounds.  Abdominal:     General: Bowel sounds are normal. There is no distension.     Palpations: There is no mass.     Tenderness: There is no  abdominal tenderness.     Hernia: No hernia is present.  Musculoskeletal:     Right lower leg: No edema.     Left lower leg: No edema.  Lymphadenopathy:     Cervical: No cervical adenopathy.  Skin:    General: Skin is warm.  Neurological:     General: No focal deficit present.     Mental Status: He is alert.     Deep Tendon Reflexes:     Reflex Scores:      Bicep reflexes are 2+ on the right side and 2+ on the left side.      Patellar reflexes are 2+ on the right side and 2+ on the left side.    Comments: Bilateral upper and lower extremity strength 5/5  Psychiatric:        Mood and Affect: Mood normal.        Behavior: Behavior normal.        Thought Content: Thought content normal.        Judgment: Judgment normal.    Title   Diabetic Foot Exam - detailed Visual Foot Exam completed.: Yes  Is there a history of foot ulcer?: No Is there a foot ulcer now?: No Is there swelling?: No Is there elevated skin temperature?: No Is there abnormal foot shape?: No Is there a claw toe deformity?: No Are the toenails long?: Yes Are the toenails thick?: Yes Are the toenails ingrown?: No Pulse Foot Exam completed.: Yes   Right Posterior Tibialis: Present Left posterior Tibialis: Present   Right Dorsalis Pedis: Present Left Dorsalis Pedis: Present     Sensory Foot Exam Completed.: Yes Semmes-Weinstein Monofilament Test + means has sensation and - means no sensation      Image components are not supported.   Image components are not supported. Image components are not supported.  Tuning Fork Comments All 10 sites tested sensation intact bilaterally.     Results for orders placed or performed in visit on 01/16/24  POCT glycosylated hemoglobin (Hb A1C)  Result Value Ref Range   Hemoglobin A1C 5.8 (A) 4.0 - 5.6 %   HbA1c POC (<> result, manual entry)     HbA1c, POC (prediabetic range)     HbA1c, POC (controlled diabetic range)        The ASCVD Risk score (Arnett  DK, et al., 2019) failed to calculate for the following reasons:   The valid total cholesterol range is 130 to 320 mg/dL    Assessment & Plan:   Problem List Items Addressed This Visit       Cardiovascular and Mediastinum   Chronic systolic heart failure (HCC)   Patient currently maintained on Entresto , carvedilol   12.5 mg twice daily, ASA daily.  Patient was on spironolactone  but was discontinued by cardiology.      Relevant Orders   Comprehensive metabolic panel with GFR   CBC with Differential/Platelet   Essential hypertension   Patient currently maintained on carvedilol  12.5 twice daily.  Currently followed by cardiology.  Blood pressure controlled continue      Relevant Orders   Comprehensive metabolic panel with GFR   CBC with Differential/Platelet   TSH   Microalbumin / creatinine urine ratio     Respiratory   COPD (chronic obstructive pulmonary disease) (HCC)   History of the same.  Patient has albuterol  inhaler at home to use as needed.  Has not had to use in years.  Stable      Relevant Orders   Comprehensive metabolic panel with GFR   CBC with Differential/Platelet     Endocrine   Well controlled type 2 diabetes mellitus (HCC)   Patient currently maintained on 1000 mg metformin  twice daily and glimepiride  1 mg every morning.  Patient does not check glucose levels at home but has supplies if he needs it.  A1c well-controlled today at 5.8%.  No reports of hypoglycemia continue regimen as prescribed      Relevant Orders   POCT glycosylated hemoglobin (Hb A1C) (Completed)   Comprehensive metabolic panel with GFR   CBC with Differential/Platelet   Microalbumin / creatinine urine ratio     Other   HLD (hyperlipidemia)   History of the same.  Patient is currently followed by cardiology.  Patient is maintained on atorvastatin  80 mg daily, Repatha  140 mg every 14 days, ezetimibe  10 mg daily and fenofibrate  145 mg daily.  Did review most recent lipid panel done by  cardiology.  Continue medication as prescribed follow-up specialist as recommended      Former tobacco use   Pending urine microscopy to rule out microscopic hematuria for screening for bladder cancer      Relevant Orders   Urine Microscopic   Chronic bilateral low back pain without sciatica   History of the pain we will refill tramadol  that he uses sparingly      Relevant Medications   traMADol  (ULTRAM ) 50 MG tablet   Bilateral hand pain   History of the same and knee pain.  Will refer to sports medicine for further evaluation.  Patient was a Scientist, water quality and live.  He has tried Tylenol  without great relief.  We have drawn ANA and RF in the past that was negative.      History of gout   Currently 10 on allopurinol  100 mg daily pending uric acid level today      Relevant Orders   Uric acid   Preventative health care - Primary   Discussed age-appropriate immunizations and screening exams.  Did review patient's personal, surgical, social, family histories.  Patient is up-to-date on all age-appropriate vaccinations.  Recently got his flu and COVID vaccine at the pharmacy.  Needs to update his shingles vaccine at local pharmacy.  Patient is up-to-date on CRC screening.  PSA today for prostate cancer screening.  Patient was referred to LDCT program for lung cancer screening.  Patient was given information at discharge about preventative healthcare maintenance with anticipatory guidance.      Relevant Orders   PSA, Medicare   Mass of head   Likely a cyst on patient's inferior forehead left-sided.  Mobile nontender and soft.  Pending ultrasound.  Patient was given information to call and set  up ultrasound at his convenience      Relevant Orders   US  SOFT TISSUE HEAD & NECK (NON-THYROID )   Other Visit Diagnoses       Screening for lung cancer       Relevant Orders   Ambulatory Referral Lung Cancer Screening [facility]     Screening for prostate cancer           Return in  about 6 months (around 07/16/2024) for DM recheck.    Adina Crandall, NP

## 2024-01-16 NOTE — Assessment & Plan Note (Signed)
 Pending urine microscopy to rule out microscopic hematuria for screening for bladder cancer

## 2024-01-17 ENCOUNTER — Ambulatory Visit: Payer: Self-pay | Admitting: Nurse Practitioner

## 2024-01-20 NOTE — Progress Notes (Signed)
 "    Travis Cassada T. Deandrea Vanpelt, MD, CAQ Sports Medicine [facility] [address] [place], [address]  Phone: [phone]  FAX: [phone]  Travis [name] - 68 y.o. male  MRN [id number]  Date of Birth: 1956/03/12  Date: 01/22/2024  PCP: Wendee Lynwood HERO, NP  Referral: Wendee Lynwood HERO, NP  Chief Complaint  Patient presents with   Knee Pain    Left   Subjective:   Travis [name] is a 68 y.o. very pleasant male patient with Body mass index is 24.95 kg/m. who presents with the following:  Discussed the use of AI scribe software for clinical note transcription with the patient, who gave verbal consent to proceed.  Patient presents for left-sided knee pain.  I have seen him in the past for various conditions including shoulder pain, arthritis, and gout.  Lab Results  Component Value Date   HGBA1C 5.8 (A) 01/16/2024    History of Present Illness Travis [name] is a 68 year old male with gout and arthritis who presents with acute knee pain and swelling.  The knee pain began last Friday as an ache and progressively worsened by Saturday, rendering him unable to walk. The pain initially localized in one area and then radiated to the joint. By Monday evening, he experienced some improvement, allowing him to put down the cane.  He has a history of gout and arthritis and takes aloe vera for gout. The knee became red and warm, similar to previous gout attacks he has experienced. There was no recent injury to the knee.  For pain management, he alternates between tramadol  and Tylenol  every other day. He is on a Tylenol  regimen and has been prescribed a low dose of tramadol . He avoids ibuprofen  or Aleve  due to his current medication regimen.  He experiences difficulty with mobility, particularly over the weekend, necessitating the use of a cane. Being right-handed, he found it challenging to use the cane on his left side. The pain persists, especially at night, and  he has difficulty bending the knee.    Review of Systems is noted in the HPI, as appropriate  Objective:   BP (!) 140/70   Pulse 84   Temp 99.8 F (37.7 C) (Temporal)   Ht 6' 0.5 (1.842 m)   Wt 186 lb 8 oz (84.6 kg)   SpO2 97%   BMI 24.95 kg/m   GEN: No acute distress; alert,appropriate. PULM: Breathing comfortably in no respiratory distress PSYCH: Normally interactive.   Mild left-sided knee effusion Full extension Flexion 105 Mild medial lateral joint line tenderness ACL, PCL, MCL, LCL are all stable Any kind of additional flexion causes some significant pain There is some mild warmth in the left knee  Laboratory and Imaging Data:  Assessment and Plan:     ICD-10-CM   1. Acute idiopathic gout of left knee  M10.062     2. Acute pain of left knee  M25.562      Assessment & Plan Acute gout flare of the left knee Acute gout flare with pain, swelling, and warmth. Symptoms improving since Monday. Prednisone  expected to reduce inflammation. - Prescribed prednisone  40 mg, two tablets daily for one week.  Pain in left knee Pain due to acute gout flare, impairing mobility, especially at night. Slowly improving. - Continue tramadol  and Tylenol  as needed along with prednisone  burst.  Medication Management during today's office visit: Meds ordered this encounter  Medications   predniSONE  (DELTASONE ) 20 MG  tablet    Sig: Take 2 tablets (40 mg total) by mouth daily.    Dispense:  14 tablet    Refill:  0   There are no discontinued medications.  Orders placed today for conditions managed today: No orders of the defined types were placed in this encounter.   Disposition: No follow-ups on file.  [employer] One speech-to-text software was used for transcription in this dictation.  Possible transcriptional errors can occur using Animal nutritionist.   Signed,  Jacques DASEN. Bayani Renteria, MD   Outpatient Encounter Medications as of 01/22/2024  Medication Sig    predniSONE  (DELTASONE ) 20 MG tablet Take 2 tablets (40 mg total) by mouth daily.   acetaminophen  (TYLENOL ) 500 MG tablet Take 2 tablets (1,000 mg total) by mouth every 8 (eight) hours as needed.   albuterol  (VENTOLIN  HFA) 108 (90 Base) MCG/ACT inhaler Inhale 2 puffs into the lungs every 6 (six) hours as needed for wheezing or shortness of breath.   allopurinol  (ZYLOPRIM ) 100 MG tablet TAKE ONE TABLET BY MOUTH TWICE A DAY   ASPIRIN  81 PO    atorvastatin  (LIPITOR) 80 MG tablet Take 1 tablet (80 mg total) by mouth daily.   Blood Pressure KIT Check b/p once daily. Based on insurance preference   carvedilol  (COREG ) 12.5 MG tablet Take 1 tablet (12.5 mg total) by mouth 2 (two) times daily with a meal.   cetirizine  (ZYRTEC ) 10 MG tablet TAKE ONE TABLET BY MOUTH ONCE DAILY   diphenhydrAMINE  (BENADRYL ) 25 MG tablet Take 25 mg by mouth daily as needed for allergies.   Evolocumab  (REPATHA  SURECLICK) 140 MG/ML SOAJ Inject 140 mg into the skin every 14 (fourteen) days.   ezetimibe  (ZETIA ) 10 MG tablet Take 1 tablet (10 mg total) by mouth daily.   fenofibrate  (TRICOR ) 145 MG tablet TAKE ONE TABLET BY MOUTH ONCE A DAY   ferrous sulfate  325 (65 FE) MG tablet Take 1 tablet (325 mg total) by mouth daily with breakfast.   fluticasone  (FLONASE ) 50 MCG/ACT nasal spray Place 1-2 sprays into both nostrils daily as needed for allergies or rhinitis.   glimepiride  (AMARYL ) 1 MG tablet TAKE ONE TABLET BY MOUTH EVERY MORNING WITH BREAKFAST   hydroxypropyl methylcellulose / hypromellose (ISOPTO TEARS / GONIOVISC) 2.5 % ophthalmic solution Place 1 drop into both eyes 2 (two) times daily.   magnesium  oxide (MAG-OX) 400 MG tablet TAKE ONE TABLET (400 MG TOTAL) BY MOUTH DAILY.   metFORMIN  (GLUCOPHAGE ) 500 MG tablet TAKE TWO TABLETS BY MOUTH TWICE A DAY WITH A MEAL   multivitamin (ONE-A-DAY MEN'S) TABS tablet Take 1 tablet by mouth daily with breakfast.   Omega-3 Fatty Acids (FISH OIL  PO) Take 1 capsule by mouth daily.    pantoprazole  (PROTONIX ) 40 MG tablet TAKE ONE TABLET BY MOUTH ONCE A DAY   predniSONE  (DELTASONE ) 10 MG tablet Take 10 mg by mouth daily with breakfast.   sacubitril -valsartan  (ENTRESTO ) 97-103 MG Take 1 tablet by mouth 2 (two) times daily.   [EXPIRED] traMADol  (ULTRAM ) 50 MG tablet Take 1 tablet (50 mg total) by mouth every 8 (eight) hours as needed for up to 5 days.   No facility-administered encounter medications on file as of 01/22/2024.   "

## 2024-01-22 ENCOUNTER — Ambulatory Visit (INDEPENDENT_AMBULATORY_CARE_PROVIDER_SITE_OTHER): Admitting: Family Medicine

## 2024-01-22 ENCOUNTER — Encounter: Payer: Self-pay | Admitting: Family Medicine

## 2024-01-22 VITALS — BP 140/70 | HR 84 | Temp 99.8°F | Ht 72.5 in | Wt 186.5 lb

## 2024-01-22 DIAGNOSIS — M10062 Idiopathic gout, left knee: Secondary | ICD-10-CM | POA: Diagnosis not present

## 2024-01-22 DIAGNOSIS — M25562 Pain in left knee: Secondary | ICD-10-CM | POA: Diagnosis not present

## 2024-01-22 MED ORDER — PREDNISONE 20 MG PO TABS: 40.0000 mg | ORAL_TABLET | Freq: Every day | ORAL | 0 refills | Status: DC

## 2024-01-23 ENCOUNTER — Ambulatory Visit: Admission: RE | Admit: 2024-01-23 | Source: Ambulatory Visit

## 2024-02-03 ENCOUNTER — Other Ambulatory Visit: Payer: Self-pay | Admitting: Family

## 2024-02-03 ENCOUNTER — Other Ambulatory Visit: Payer: Self-pay | Admitting: Nurse Practitioner

## 2024-02-03 DIAGNOSIS — E1165 Type 2 diabetes mellitus with hyperglycemia: Secondary | ICD-10-CM

## 2024-02-06 ENCOUNTER — Ambulatory Visit: Admission: RE | Admit: 2024-02-06 | Source: Ambulatory Visit

## 2024-02-10 ENCOUNTER — Encounter: Payer: Self-pay | Admitting: Internal Medicine

## 2024-02-10 ENCOUNTER — Ambulatory Visit (INDEPENDENT_AMBULATORY_CARE_PROVIDER_SITE_OTHER): Admitting: Internal Medicine

## 2024-02-10 ENCOUNTER — Ambulatory Visit: Payer: Self-pay

## 2024-02-10 VITALS — BP 124/68 | HR 80 | Ht 72.5 in | Wt 186.0 lb

## 2024-02-10 DIAGNOSIS — K529 Noninfective gastroenteritis and colitis, unspecified: Secondary | ICD-10-CM | POA: Diagnosis not present

## 2024-02-10 MED ORDER — CIPROFLOXACIN HCL 250 MG PO TABS: 250.0000 mg | ORAL_TABLET | Freq: Two times a day (BID) | ORAL | 0 refills | Status: DC

## 2024-02-10 NOTE — Patient Instructions (Addendum)
 Stop the metformin  until yo are back to normal   No diet soft drinks, no fried or greasy food.  No salads!    Boiled or baked Chicken,  rice  and potatoes   until   stools return to normal   Use Pedialyte or gatorade for the electrolytes   Pick up some imodium  for the diarrhea   and I will call in the ciprofloxacin   to [facility] [address].

## 2024-02-10 NOTE — Telephone Encounter (Signed)
 Appreciate Dr Marylynn evaluating the patient

## 2024-02-10 NOTE — Progress Notes (Unsigned)
 Subjective:  Patient ID: Travis [name], male    DOB: 08/22/1955  Age: 68 y.o. MRN: [id number]  CC: There were no encounter diagnoses.   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   occipital headache for several days managed with tylenol .  Has been drinking  water and ginger ale,    no vomiting but even liquid diet causes diarrhea. anorexia started today.  Has had drenching sweats ,  4 in the last several days.   Eating chicken and rice, potatoes and green beans   Had flu  and COVID shots 6 weeks   Lives alone , no known sick contacts and no recent travel.   Ate at [employer] and [place]  Fried chicken prior to onset.   No recent antibiotic, last prescription was moths ago   Taking metformin    Stools have been dark and liquid but no blood.  No abdominalpain    Outpatient Medications Prior to Visit  Medication Sig Dispense Refill   acetaminophen  (TYLENOL ) 500 MG tablet Take 2 tablets (1,000 mg total) by mouth every 8 (eight) hours as needed. 30 tablet 0   albuterol  (VENTOLIN  HFA) 108 (90 Base) MCG/ACT inhaler Inhale 2 puffs into the lungs every 6 (six) hours as needed for wheezing or shortness of breath.     allopurinol  (ZYLOPRIM ) 100 MG tablet TAKE ONE TABLET BY MOUTH TWICE A DAY 180 tablet 1   ASPIRIN  81 PO      atorvastatin  (LIPITOR) 80 MG tablet Take 1 tablet (80 mg total) by mouth daily. 90 tablet 3   Blood Pressure KIT Check b/p once daily. Based on insurance preference 1 kit 0   carvedilol  (COREG ) 12.5 MG tablet Take 1 tablet (12.5 mg total) by mouth 2 (two) times daily with a meal. 180 tablet 3   cetirizine  (ZYRTEC ) 10 MG tablet TAKE ONE TABLET BY MOUTH ONCE DAILY 90 tablet 3    diphenhydrAMINE  (BENADRYL ) 25 MG tablet Take 25 mg by mouth daily as needed for allergies.     Evolocumab  (REPATHA  SURECLICK) 140 MG/ML SOAJ Inject 140 mg into the skin every 14 (fourteen) days. 6 mL 3   ezetimibe  (ZETIA ) 10 MG tablet Take 1 tablet (10 mg total) by mouth daily. 30 tablet 11   fenofibrate  (TRICOR ) 145 MG tablet TAKE ONE TABLET BY MOUTH ONCE A DAY 90 tablet 1   ferrous sulfate  325 (65 FE) MG tablet Take 1 tablet (325 mg total) by mouth daily with breakfast. 90 tablet 2   fluticasone  (FLONASE ) 50 MCG/ACT nasal spray Place 1-2 sprays into both nostrils daily as needed for allergies or rhinitis.     glimepiride  (AMARYL ) 1 MG tablet TAKE ONE TABLET BY MOUTH EVERY MORNING WITH BREAKFAST 90 tablet 1   hydroxypropyl methylcellulose / hypromellose (ISOPTO TEARS / GONIOVISC) 2.5 % ophthalmic solution Place 1 drop into both eyes 2 (two) times daily.     magnesium  oxide (MAG-OX) 400 (240 Mg) MG tablet TAKE ONE TABLET (400 MG TOTAL) BY MOUTH DAILY. 90 tablet 1   metFORMIN  (GLUCOPHAGE ) 500 MG tablet TAKE TWO TABLETS BY MOUTH TWICE A DAY WITH A MEAL 360 tablet 1   multivitamin (ONE-A-DAY MEN'S) TABS tablet Take 1 tablet  by mouth daily with breakfast.     Omega-3 Fatty Acids (FISH OIL  PO) Take 1 capsule by mouth daily.     pantoprazole  (PROTONIX ) 40 MG tablet TAKE ONE TABLET BY MOUTH ONCE A DAY 90 tablet 1   predniSONE  (DELTASONE ) 10 MG tablet Take 10 mg by mouth daily with breakfast.     predniSONE  (DELTASONE ) 20 MG tablet Take 2 tablets (40 mg total) by mouth daily. 14 tablet 0   sacubitril -valsartan  (ENTRESTO ) 97-103 MG Take 1 tablet by mouth 2 (two) times daily. 60 tablet 11   No facility-administered medications prior to visit.    Review of Systems;  Patient denies headache, fevers, malaise, unintentional weight loss, skin rash, eye pain, sinus congestion and sinus pain, sore throat, dysphagia,  hemoptysis , cough, dyspnea, wheezing, chest pain, palpitations, orthopnea, edema, abdominal  pain, nausea, melena, diarrhea, constipation, flank pain, dysuria, hematuria, urinary  Frequency, nocturia, numbness, tingling, seizures,  Focal weakness, Loss of consciousness,  Tremor, insomnia, depression, anxiety, and suicidal ideation.      Objective:  BP 124/68   Pulse 80   Ht 6' 0.5 (1.842 m)   Wt 186 lb (84.4 kg)   SpO2 99%   BMI 24.88 kg/m   BP Readings from Last 3 Encounters:  02/10/24 124/68  01/22/24 (!) 140/70  01/16/24 128/60    Wt Readings from Last 3 Encounters:  02/10/24 186 lb (84.4 kg)  01/22/24 186 lb 8 oz (84.6 kg)  01/16/24 186 lb 3.2 oz (84.5 kg)    Physical Exam  Lab Results  Component Value Date   HGBA1C 5.8 (A) 01/16/2024   HGBA1C 6.2 (A) 07/17/2023   HGBA1C 7.2 04/08/2023    Lab Results  Component Value Date   CREATININE 1.24 01/16/2024   CREATININE 1.28 (H) 12/18/2023   CREATININE 1.41 (H) 12/09/2023    Lab Results  Component Value Date   WBC 7.8 01/16/2024   HGB 14.1 01/16/2024   HCT 43.4 01/16/2024   PLT 443.0 (H) 01/16/2024   GLUCOSE 78 01/16/2024   CHOL 93 12/09/2023   TRIG 79 12/09/2023   HDL 40 (L) 12/09/2023   LDLDIRECT 78.0 03/03/2021   LDLCALC 37 12/09/2023   ALT 16 01/16/2024   AST 19 01/16/2024   NA 141 01/16/2024   K 4.8 01/16/2024   CL 103 01/16/2024   CREATININE 1.24 01/16/2024   BUN 17 01/16/2024   CO2 30 01/16/2024   TSH 0.75 01/16/2024   PSA 0.84 01/16/2024   INR 1.2 01/06/2021   HGBA1C 5.8 (A) 01/16/2024   MICROALBUR 1.5 01/16/2024    No results found.  Assessment & Plan:  .There are no diagnoses linked to this encounter.   I spent 34 minutes on the day of this face to face encounter reviewing patient's  most recent visit with cardiology,  nephrology,  and neurology,  prior relevant surgical and non surgical procedures, recent  labs and imaging studies, counseling on weight management,  reviewing the assessment and plan with patient, and post visit ordering and reviewing of  diagnostics and  therapeutics with patient  .   Follow-up: No follow-ups on file.   Verneita LITTIE Kettering, MD

## 2024-02-10 NOTE — Telephone Encounter (Signed)
 FYI Only or Action Required?: FYI only for provider: appointment scheduled on 02/10/2024.  Patient was last seen in primary care on 01/22/2024 by Watt Mirza, MD.  Called Nurse Triage reporting Headache.  Symptoms began x 3 days.  Interventions attempted: Nothing.  Symptoms are: gradually worsening.  Triage Disposition: See Physician Within 24 Hours  Patient/caregiver understands and will follow disposition?: Yes     Copied from CRM #[id number]. Topic: Clinical - Red Word Triage >> Feb 10, 2024 10:08 AM Donna BRAVO wrote: Patient has no numbness or tingling from headache. Pain behind both eyes.  Continuous fever of 100 Reason for Disposition  [1] MODERATE headache (e.g., interferes with normal activities) AND [2] present > 24 hours AND [3] unexplained  (Exceptions: Pain medicines not tried, typical migraine, or headache part of viral illness.)  Answer Assessment - Initial Assessment Questions 1. LOCATION: Where does it hurt?      Back of head 2. ONSET: When did the headache start? (e.g., minutes, hours, days)      X 3 days 3. PATTERN: Does the pain come and go, or has it been constant since it started?     constant 4. SEVERITY: How bad is the pain? and What does it keep you from doing?  (e.g., Scale 1-10; mild, moderate, or severe)     moderate 5. RECURRENT SYMPTOM: Have you ever had headaches before? If Yes, ask: When was the last time? and What happened that time?      no 6. CAUSE: What do you think is causing the headache?     Possible sinus 7. MIGRAINE: Have you been diagnosed with migraine headaches? If Yes, ask: Is this headache similar?      na 8. HEAD INJURY: Has there been any recent injury to your head?      no 9. OTHER SYMPTOMS: Do you have any other symptoms? (e.g., fever, stiff neck, eye pain, sore throat, cold symptoms)     Fever, sinus issues 10. PREGNANCY: Is there any chance you are pregnant? When was your last menstrual  period?       na  Left knee pain: took prednisone  and everything was feeling better and now knee has soreness and stiffness at knee area  Protocols used: Headache-A-AH

## 2024-02-11 ENCOUNTER — Ambulatory Visit

## 2024-02-11 DIAGNOSIS — K529 Noninfective gastroenteritis and colitis, unspecified: Secondary | ICD-10-CM | POA: Insufficient documentation

## 2024-02-11 NOTE — Assessment & Plan Note (Addendum)
 Symptoms present for 4-5 days.  Still eating fried food and taking metformin .  Advised to suspend metformin  and follow a bland diet.  Use pedialyte or regular gatorade for fluids.  Given his history of splenectomy and diabetes will give empiric cipro.  Follow up one week

## 2024-02-12 ENCOUNTER — Ambulatory Visit: Payer: Self-pay | Admitting: *Deleted

## 2024-02-12 NOTE — Telephone Encounter (Signed)
 Noted

## 2024-02-12 NOTE — Telephone Encounter (Signed)
                       FYI Only or Action Required?: FYI only for provider: appointment scheduled on 02/13/24.  Patient was last seen in primary care on 02/10/2024 by Marylynn Verneita CROME, MD.  Called Nurse Triage reporting Knee Pain.  Symptoms began on going .  Interventions attempted: Prescription medications: cipro  for gastritis, tylenol  for pain and Rest, hydration, or home remedies.  Symptoms are: gradually worsening.  Triage Disposition: See HCP Within 4 Hours (Or PCP Triage) 4-24 hours  Patient/caregiver understands and will follow disposition?: Yes       Copied from CRM #[id number]. Topic: Clinical - Red Word Triage >> Feb 12, 2024  9:40 AM Alfonso ORN wrote: Red Word that prompted transfer to Nurse Triage: leg pain , worsening Reason for Disposition  [1] SEVERE pain (e.g., excruciating, unable to walk) AND [2] not improved after 2 hours of pain medicine  Answer Assessment - Initial Assessment Questions Appt scheduled with other provider 02/13/24 for left knee pain and difficulty walking. PCP not in office.  Hard to use cane. Recommended if sx worsen today go to UC or ED.       1. LOCATION and RADIATION: Where is the pain located?      Left knee 2. QUALITY: What does the pain feel like?  (e.g., sharp, dull, aching, burning)     Sharp pain  3. SEVERITY: How bad is the pain? What does it keep you from doing?   (Scale 1-10; or mild, moderate, severe)     Severe, 4-5/10 with standing or mobility 8-9/10 4. ONSET: When did the pain start? Does it come and go, or is it there all the time?     Comes and goes  5. RECURRENT: Have you had this pain before? If Yes, ask: When, and what happened then?     Yes  6. SETTING: Has there been any recent work, exercise or other activity that involved that part of the body?      na 7. AGGRAVATING FACTORS: What makes the knee pain worse? (e.g., walking, climbing stairs, running)     walking 8. ASSOCIATED  SYMPTOMS: Is there any swelling or redness of the knee?     Mild swelling left knee  9. OTHER SYMPTOMS: Do you have any other symptoms? (e.g., calf pain, chest pain, difficulty breathing, fever)     Tylenol  not effective, left knee warm to touch , painful to touch mild swelling. Bad of head pain at times. Chronic light headache.  10. PREGNANCY: Is there any chance you are pregnant? When was your last menstrual period?       na  Protocols used: Knee Pain-A-AH

## 2024-02-13 ENCOUNTER — Other Ambulatory Visit: Payer: Self-pay | Admitting: Family Medicine

## 2024-02-13 ENCOUNTER — Ambulatory Visit (INDEPENDENT_AMBULATORY_CARE_PROVIDER_SITE_OTHER): Admitting: Family Medicine

## 2024-02-13 ENCOUNTER — Ambulatory Visit (INDEPENDENT_AMBULATORY_CARE_PROVIDER_SITE_OTHER)
Admission: RE | Admit: 2024-02-13 | Discharge: 2024-02-13 | Disposition: A | Discharge status: Home / Self Care | Source: Ambulatory Visit | Attending: Family Medicine | Admitting: Family Medicine

## 2024-02-13 ENCOUNTER — Ambulatory Visit: Payer: Self-pay | Admitting: Family Medicine

## 2024-02-13 ENCOUNTER — Encounter: Payer: Self-pay | Admitting: Family Medicine

## 2024-02-13 VITALS — BP 100/64 | HR 81 | Temp 98.7°F | Ht 72.5 in | Wt 185.2 lb

## 2024-02-13 DIAGNOSIS — M25562 Pain in left knee: Secondary | ICD-10-CM

## 2024-02-13 DIAGNOSIS — K529 Noninfective gastroenteritis and colitis, unspecified: Secondary | ICD-10-CM

## 2024-02-13 MED ORDER — PREDNISONE 20 MG PO TABS: ORAL_TABLET | ORAL | 0 refills | Status: DC

## 2024-02-13 NOTE — Assessment & Plan Note (Signed)
 Acute, most likely recurrent gout flare.  Patient had resolution of pain after initial prednisone  course but then pain has returned. Discussed options for treatment including colchicine  and repeat prednisone  taper. Given recent gastroenteritis (treated with Cipro ) causing diarrhea, I feel colchicine  could make this issue worse. We will repeat a steroid taper, will longer and slower.  If symptoms recur he will follow-up with Dr. Watt for further recommendations.  Given persistent pain will also evaluate with plain film to assess severity of osteoarthritis

## 2024-02-13 NOTE — Progress Notes (Signed)
 Patient ID: Travis [name], male    DOB: April 21, 1955, 68 y.o.   MRN: [id number]  This visit was conducted in person.  BP 100/64   Pulse 81   Temp 98.7 F (37.1 C) (Temporal)   Ht 6' 0.5 (1.842 m)   Wt 185 lb 4 oz (84 kg)   SpO2 98%   BMI 24.78 kg/m    CC:  Chief Complaint  Patient presents with   Knee Pain    Left Seen by Dr. Watt 01/22/2024    Subjective:   HPI: Travis [name] is a 68 y.o. male presenting on 02/13/2024 for Knee Pain (Left/Seen by Dr. Watt 01/22/2024)   Reviewed recent OV with Dr. Watt on 01/22/2024   Treated  for gout and osteoarthritis causing knee pain with prednisone  taper  Knee had been red warm and acutely painful. For pain management, he alternates between tramadol  and Tylenol  every other day. He is on a Tylenol  regimen and has been prescribed a low dose of tramadol . He avoids ibuprofen  or Aleve  due to his current medication regimen.   Recent uric acid on 12/26/2023 4.3 on allopurinol  100 mg daily.  He reports significant improvement in pain with prednisone  taper but pain returned  1 week after completing.  Now pain  recurred, warmth , but no redness.  No fall, no change in activity.  GFR 59    Also visit  on 11/17 for gastroenteritis.SABRA treated with cipro . On bland diet.  No N/V, no fever  No diarrhea. NML UOP.   Has diabetes, well controlled. Lab Results  Component Value Date   HGBA1C 5.8 (A) 01/16/2024     Relevant past medical, surgical, family and social history reviewed and updated as indicated. Interim medical history since our last visit reviewed. Allergies and medications reviewed and updated. Outpatient Medications Prior to Visit  Medication Sig Dispense Refill   acetaminophen  (TYLENOL ) 500 MG tablet Take 2 tablets (1,000 mg total) by mouth every 8 (eight) hours as needed. 30 tablet 0   albuterol  (VENTOLIN  HFA) 108 (90 Base) MCG/ACT inhaler Inhale 2 puffs into the lungs every 6 (six) hours as needed for wheezing or  shortness of breath.     allopurinol  (ZYLOPRIM ) 100 MG tablet TAKE ONE TABLET BY MOUTH TWICE A DAY 180 tablet 1   ASPIRIN  81 PO      atorvastatin  (LIPITOR) 80 MG tablet Take 1 tablet (80 mg total) by mouth daily. 90 tablet 3   Blood Pressure KIT Check b/p once daily. Based on insurance preference 1 kit 0   carvedilol  (COREG ) 12.5 MG tablet Take 1 tablet (12.5 mg total) by mouth 2 (two) times daily with a meal. 180 tablet 3   cetirizine  (ZYRTEC ) 10 MG tablet TAKE ONE TABLET BY MOUTH ONCE DAILY 90 tablet 3   diphenhydrAMINE  (BENADRYL ) 25 MG tablet Take 25 mg by mouth daily as needed for allergies.     Evolocumab  (REPATHA  SURECLICK) 140 MG/ML SOAJ Inject 140 mg into the skin every 14 (fourteen) days. 6 mL 3   ezetimibe  (ZETIA ) 10 MG tablet Take 1 tablet (10 mg total) by mouth daily. 30 tablet 11   fenofibrate  (TRICOR ) 145 MG tablet TAKE ONE TABLET BY MOUTH ONCE A DAY 90 tablet 1   ferrous sulfate  325 (65 FE) MG tablet Take 1 tablet (325 mg total) by mouth daily with breakfast. 90 tablet 2   fluticasone  (FLONASE ) 50 MCG/ACT nasal spray Place 1-2 sprays into both nostrils daily as needed for allergies  or rhinitis.     glimepiride  (AMARYL ) 1 MG tablet TAKE ONE TABLET BY MOUTH EVERY MORNING WITH BREAKFAST 90 tablet 1   hydroxypropyl methylcellulose / hypromellose (ISOPTO TEARS / GONIOVISC) 2.5 % ophthalmic solution Place 1 drop into both eyes 2 (two) times daily.     magnesium  oxide (MAG-OX) 400 (240 Mg) MG tablet TAKE ONE TABLET (400 MG TOTAL) BY MOUTH DAILY. 90 tablet 1   metFORMIN  (GLUCOPHAGE ) 500 MG tablet TAKE TWO TABLETS BY MOUTH TWICE A DAY WITH A MEAL 360 tablet 1   multivitamin (ONE-A-DAY MEN'S) TABS tablet Take 1 tablet by mouth daily with breakfast.     Omega-3 Fatty Acids (FISH OIL  PO) Take 1 capsule by mouth daily.     pantoprazole  (PROTONIX ) 40 MG tablet TAKE ONE TABLET BY MOUTH ONCE A DAY 90 tablet 1   sacubitril -valsartan  (ENTRESTO ) 97-103 MG Take 1 tablet by mouth 2 (two) times daily.  60 tablet 11   ciprofloxacin (CIPRO) 250 MG tablet Take 1 tablet (250 mg total) by mouth 2 (two) times daily for 3 days. 6 tablet 0   predniSONE  (DELTASONE ) 10 MG tablet Take 10 mg by mouth daily with breakfast.     predniSONE  (DELTASONE ) 20 MG tablet Take 2 tablets (40 mg total) by mouth daily. 14 tablet 0   No facility-administered medications prior to visit.     Per HPI unless specifically indicated in ROS section below Review of Systems  Constitutional:  Negative for fatigue and fever.  HENT:  Negative for ear pain.   Eyes:  Negative for pain.  Respiratory:  Negative for cough and shortness of breath.   Cardiovascular:  Negative for chest pain, palpitations and leg swelling.  Gastrointestinal:  Negative for abdominal pain, constipation, diarrhea and nausea.  Genitourinary:  Negative for dysuria.  Musculoskeletal:  Negative for arthralgias.  Neurological:  Negative for syncope, light-headedness and headaches.  Psychiatric/Behavioral:  Negative for dysphoric mood.    Objective:  BP 100/64   Pulse 81   Temp 98.7 F (37.1 C) (Temporal)   Ht 6' 0.5 (1.842 m)   Wt 185 lb 4 oz (84 kg)   SpO2 98%   BMI 24.78 kg/m   Wt Readings from Last 3 Encounters:  02/13/24 185 lb 4 oz (84 kg)  02/10/24 186 lb (84.4 kg)  01/22/24 186 lb 8 oz (84.6 kg)      Physical Exam Constitutional:      Appearance: He is well-developed.  HENT:     Head: Normocephalic.     Right Ear: Hearing normal.     Left Ear: Hearing normal.     Nose: Nose normal.  Neck:     Thyroid : No thyroid  mass or thyromegaly.     Vascular: No carotid bruit.     Trachea: Trachea normal.  Cardiovascular:     Rate and Rhythm: Normal rate and regular rhythm.     Pulses: Normal pulses.     Heart sounds: Heart sounds not distant. No murmur heard.    No friction rub. No gallop.     Comments: No peripheral edema Pulmonary:     Effort: Pulmonary effort is normal. No respiratory distress.     Breath sounds: Normal breath  sounds.  Musculoskeletal:     Right knee: Deformity present. No bony tenderness. Decreased range of motion. No tenderness.     Left knee: Swelling, deformity and erythema present. Decreased range of motion. Tenderness present over the medial joint line and lateral joint line. No MCL, LCL,  ACL or PCL tenderness.  Skin:    General: Skin is warm and dry.     Findings: No rash.  Psychiatric:        Speech: Speech normal.        Behavior: Behavior normal.        Thought Content: Thought content normal.       Results for orders placed or performed in visit on 01/16/24  POCT glycosylated hemoglobin (Hb A1C)   Collection Time: 01/16/24  9:28 AM  Result Value Ref Range   Hemoglobin A1C 5.8 (A) 4.0 - 5.6 %   HbA1c POC (<> result, manual entry)     HbA1c, POC (prediabetic range)     HbA1c, POC (controlled diabetic range)    Comprehensive metabolic panel with GFR   Collection Time: 01/16/24  9:59 AM  Result Value Ref Range   Sodium 141 135 - 145 mEq/L   Potassium 4.8 3.5 - 5.1 mEq/L   Chloride 103 96 - 112 mEq/L   CO2 30 19 - 32 mEq/L   Glucose, Bld 78 70 - 99 mg/dL   BUN 17 6 - 23 mg/dL   Creatinine, Ser 8.75 0.40 - 1.50 mg/dL   Total Bilirubin 0.5 0.2 - 1.2 mg/dL   Alkaline Phosphatase 54 39 - 117 U/L   AST 19 0 - 37 U/L   ALT 16 0 - 53 U/L   Total Protein 7.3 6.0 - 8.3 g/dL   Albumin  4.7 3.5 - 5.2 g/dL   GFR 40.04 (L) >39.99 mL/min   Calcium  9.7 8.4 - 10.5 mg/dL  CBC with Differential/Platelet   Collection Time: 01/16/24  9:59 AM  Result Value Ref Range   WBC 7.8 4.0 - 10.5 K/uL   RBC 4.42 4.22 - 5.81 Mil/uL   Hemoglobin 14.1 13.0 - 17.0 g/dL   HCT 56.5 60.9 - 47.9 %   MCV 98.3 78.0 - 100.0 fl   MCHC 32.6 30.0 - 36.0 g/dL   RDW 83.7 (H) 88.4 - 84.4 %   Platelets 443.0 (H) 150.0 - 400.0 K/uL   Neutrophils Relative % 40.2 (L) 43.0 - 77.0 %   Lymphocytes Relative 41.1 12.0 - 46.0 %   Monocytes Relative 6.4 3.0 - 12.0 %   Eosinophils Relative 11.3 (H) 0.0 - 5.0 %   Basophils  Relative 1.0 0.0 - 3.0 %   Neutro Abs 3.1 1.4 - 7.7 K/uL   Lymphs Abs 3.2 0.7 - 4.0 K/uL   Monocytes Absolute 0.5 0.1 - 1.0 K/uL   Eosinophils Absolute 0.9 (H) 0.0 - 0.7 K/uL   Basophils Absolute 0.1 0.0 - 0.1 K/uL  TSH   Collection Time: 01/16/24  9:59 AM  Result Value Ref Range   TSH 0.75 0.35 - 5.50 uIU/mL  PSA, Medicare   Collection Time: 01/16/24  9:59 AM  Result Value Ref Range   PSA 0.84 0.10 - 4.00 ng/ml  Urine Microscopic   Collection Time: 01/16/24  9:59 AM  Result Value Ref Range   WBC, UA 3-6/hpf (A) 0-2/hpf   RBC / HPF 0-2/hpf 0-2/hpf   Squamous Epithelial / HPF Rare(0-4/hpf) Rare(0-4/hpf)  Microalbumin / creatinine urine ratio   Collection Time: 01/16/24  9:59 AM  Result Value Ref Range   Microalb, Ur 1.5 0.0 - 1.9 mg/dL   Creatinine,U 880.0 mg/dL   Microalb Creat Ratio 12.2 0.0 - 30.0 mg/g  Uric acid   Collection Time: 01/16/24  9:59 AM  Result Value Ref Range   Uric Acid, Serum 4.3 4.0 -  7.8 mg/dL    Assessment and Plan  Acute pain of left knee Assessment & Plan: Acute, most likely recurrent gout flare.  Patient had resolution of pain after initial prednisone  course but then pain has returned. Discussed options for treatment including colchicine  and repeat prednisone  taper. Given recent gastroenteritis (treated with Cipro ) causing diarrhea, I feel colchicine  could make this issue worse. We will repeat a steroid taper, will longer and slower.  If symptoms recur he will follow-up with Dr. Watt for further recommendations.  Given persistent pain will also evaluate with plain film to assess severity of osteoarthritis  Orders: -     DG Knee Complete 4 Views Left; Future  Gastroenteritis Assessment & Plan: Recent, now resolved. Patient has completed ciprofloxacin  given by Dr. Marylynn. Given resolution of diarrhea I have cleared him to return to less bland diet although encouraged him to avoid greasy foods.  Encouraged to continue water intake.   Other  orders -     predniSONE ; 2 tablet daily x 1 week then 1 tablet daily x 1 week  Dispense: 21 tablet; Refill: 0    No follow-ups on file.   Greig Ring, MD

## 2024-02-13 NOTE — Assessment & Plan Note (Signed)
 Recent, now resolved. Patient has completed ciprofloxacin given by Dr. Marylynn. Given resolution of diarrhea I have cleared him to return to less bland diet although encouraged him to avoid greasy foods.  Encouraged to continue water intake.

## 2024-02-19 ENCOUNTER — Other Ambulatory Visit: Payer: Self-pay | Admitting: Cardiovascular Disease

## 2024-02-19 DIAGNOSIS — E782 Mixed hyperlipidemia: Secondary | ICD-10-CM

## 2024-02-19 DIAGNOSIS — I2510 Atherosclerotic heart disease of native coronary artery without angina pectoris: Secondary | ICD-10-CM

## 2024-02-25 ENCOUNTER — Telehealth: Payer: Self-pay | Admitting: Pharmacist

## 2024-02-25 NOTE — Progress Notes (Signed)
 Pharmacy Quality Measure Review  This patient is appearing on the insurance-providing list for being at risk of failing the adherence measure for Statin Therapy for Patients with Cardiovascular Disease (SPC) and Statin Use in Persons with Diabetes (SUPD) medications this calendar year.   Medication: prescribed atorvastatin  but has not refilled since June 2024. Will collaborate with PCP to refill.   Catie IVAR Centers, PharmD, [facility] Clinical Pharmacist [phone]

## 2024-02-26 ENCOUNTER — Ambulatory Visit: Admission: RE | Admit: 2024-02-26 | Discharge: 2024-02-26 | Attending: Nurse Practitioner

## 2024-02-26 DIAGNOSIS — R220 Localized swelling, mass and lump, head: Secondary | ICD-10-CM | POA: Diagnosis present

## 2024-02-28 ENCOUNTER — Telehealth: Payer: Self-pay | Admitting: Family

## 2024-02-28 ENCOUNTER — Ambulatory Visit: Attending: Cardiology

## 2024-02-28 DIAGNOSIS — I5022 Chronic systolic (congestive) heart failure: Secondary | ICD-10-CM

## 2024-02-28 LAB — ECHOCARDIOGRAM COMPLETE
AR max vel: 2.32 cm2
AV Area VTI: 2.55 cm2
AV Area mean vel: 2.3 cm2
AV Mean grad: 3 mmHg
AV Peak grad: 4.8 mmHg
Ao pk vel: 1.1 m/s
Area-P 1/2: 3.83 cm2
S' Lateral: 4.5 cm

## 2024-02-28 NOTE — Telephone Encounter (Signed)
 Called to confirm/remind patient of their appointment [facility] on 03/02/24.   Appointment:   [x] Confirmed  [] Left mess   [] No answer/No voice mail  [] VM Full/unable to leave message  [] Phone not in service  Patient reminded to bring all medications and/or complete list.  Confirmed patient has transportation. Gave directions, instructed to utilize valet parking.

## 2024-03-01 ENCOUNTER — Ambulatory Visit (HOSPITAL_COMMUNITY): Payer: Self-pay | Admitting: Cardiology

## 2024-03-01 NOTE — Progress Notes (Unsigned)
 [facility] Note    PCP: Travis Lynwood HERO, NP HF Cardiology: Dr. Rolan  Chief complaint: HF visit   HPI:  [name] is a 68 y.o. male with history of CHF, HTN, CKD stage 3 who was referred to HF MD by Travis [name].  Patient has a long history of cardiomyopathy.  In 11/16, echo was done showing EF 25-30%.  Cath at that time showed nonobstructive CAD.  Over the years, his LV function improved, with echoes in 2021 and 2022 showing EF 45-50%. He was a prior heavy drinker but quit several years ago. He is also a prior smoker. ETOH was thought to contribute to his cardiomyopathy. He had a long admission in 10/22 with ruptured appendicitis and sepsis. This admission was also complicated by splenic rupture and splenectomy.  GDMT in recent years appears to have been limited by low BP and renal dysfunction.    In 1/24, he had an echo done that showed EF 25-30% with mild LV dilation and mild LVH. This was a signficant fall from previous years.  He did not report any significant changes around this time. LHC/RHC in 4/24 showed 60% pLAD stenosis, 40% D1; normal filling pressures and CI 2.61.   Patient currently does not smoke cigarettes or drink, he does use marijuana occasionally.  No other drugs.    Cardiac MRI (7/24) with EF 41%, RVEF 49%, subtle mid-wall LGE in the basal septal wall (nonspecific), ECV 33%.   He presents today for a HF visit. Currently doesn't have any shortness of breath, chest pain, palpitations, edema, dizziness. Says that he's been out of his lipid medications and his spironolactone  because they all have needed refills.   ECG (8/25), personally reviewed): NSR, nonspecific inferolateral TWIs.   Labs (2/24): K 4.3, creatinine 1.42 Labs (5/24): K 4.1 => 4.8, creatinine 1.57 => 1.55, LDL 80 Labs (6/24): K 4.5, creatinine 1.4, BNP 26 Labs (10/24): LDL 71 Labs (9/25): K 5.1=>4.7, creatinine 1.41=>1.28 Labs (10/25): K 4.8, creatinine 1.24, LFT's normal  PMH: 1.  Hyperlipidemia 2. Gout 3. HTN 4. GI bleeding 5. Type 2 diabetes 6. H/o ETOH abuse: Quit drinking in 2022.  7. Central retinal artery occlusion: Left eye, 2015.  8. CKD stage 3 9. Appendicitis: 10/22 hospitalization with ruptured appendix, also complicated by splenic rupture needing splenectomy.  10. H/o LUE DVT: Thought to be related to PICC.  11. H/o TIA 12. Chronic systolic CHF: Echo in 11/16 with EF 25-30%, cath in 2016 and 4/24with nonobstructive CAD.  - Echo (5/18): EF 35-40%.  - Echo (3/21): EF 35-40% - Echo (8/21): EF 45-50% - Echo (10/22): EF 45-50%, mild MR - Echo (3/24): EF 25-30%, mild LV dilation, mild LVH, normal RV, mild MR.  - Echo (12/25): EF 35-40%, G1DD, normal RV, trivial MR - RHC (4/24): mean RA 5, PA 36/12, mean PCWP 9, CI 2.61 - Cardiac MRI (7/24) with EF 41%, RVEF 49%, subtle mid-wall LGE in the basal septal wall (nonspecific), ECV 33%.  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 CAD: Cath (4/24) with 60% pLAD, 40% D1.   Social History   Socioeconomic History   Marital status: Single    Spouse name: Not on file   Number of children: 0   Years of education: 12   Highest education level: 12th grade  Occupational History   Occupation: Retired  Tobacco Use   Smoking status: Former    Current packs/day: 0.00    Average packs/day: 0.8 packs/day for 30.0 years (  22.5 ttl pk-yrs)    Types: Cigarettes    Start date: 12/1990    Quit date: 12/2020    Years since quitting: 3.1   Smokeless tobacco: Never  Vaping Use   Vaping status: Never Used  Substance and Sexual Activity   Alcohol  use: Not Currently   Drug use: Not Currently   Sexual activity: Yes    Birth control/protection: Condom  Other Topics Concern   Not on file  Social History Narrative   Not on file   Social Drivers of Health   Financial Resource Strain: Low Risk  (01/18/2024)   Overall Financial Resource Strain (CARDIA)    Difficulty of Paying Living Expenses: Not hard at all   Food Insecurity: No Food Insecurity (01/18/2024)   Hunger Vital Sign    Worried About Running Out of Food in the Last Year: Never true    Ran Out of Food in the Last Year: Never true  Transportation Needs: No Transportation Needs (01/18/2024)   PRAPARE - Administrator, Civil Service (Medical): No    Lack of Transportation (Non-Medical): No  Physical Activity: Sufficiently Active (01/18/2024)   Exercise Vital Sign    Days of Exercise per Week: 6 days    Minutes of Exercise per Session: 40 min  Stress: No Stress Concern Present (01/18/2024)   [employer] of Occupational Health - Occupational Stress Questionnaire    Feeling of Stress: Only a little  Social Connections: Moderately Isolated (01/18/2024)   Social Connection and Isolation Panel    Frequency of Communication with Friends and Family: More than three times a week    Frequency of Social Gatherings with Friends and Family: More than three times a week    Attends Religious Services: 1 to 4 times per year    Active Member of [employer] or Organizations: No    Attends Engineer, Structural: Not on file    Marital Status: Never married  Intimate Partner Violence: Not At Risk (08/06/2023)   Humiliation, Afraid, Rape, and Kick questionnaire    Fear of Current or Ex-Partner: No    Emotionally Abused: No    Physically Abused: No    Sexually Abused: No   Family History  Problem Relation Age of Onset   Diabetes Mother    Hypertension Mother    Diabetes Father    Hypertension Father    Diabetes Sister    Dementia Brother    Cancer Neg Hx    Heart disease Neg Hx    Stroke Neg Hx    Colon cancer Neg Hx    Esophageal cancer Neg Hx    Rectal cancer Neg Hx    Stomach cancer Neg Hx    ROS: All systems reviewed and negative except as per HPI.   Current Outpatient Medications on File Prior to Visit  Medication Sig Dispense Refill   acetaminophen  (TYLENOL ) 500 MG tablet Take 2 tablets (1,000 mg total) by mouth  every 8 (eight) hours as needed. 30 tablet 0   albuterol  (VENTOLIN  HFA) 108 (90 Base) MCG/ACT inhaler Inhale 2 puffs into the lungs every 6 (six) hours as needed for wheezing or shortness of breath.     allopurinol  (ZYLOPRIM ) 100 MG tablet TAKE ONE TABLET BY MOUTH TWICE A DAY 180 tablet 1   ASPIRIN  81 PO      atorvastatin  (LIPITOR) 80 MG tablet Take 1 tablet (80 mg total) by mouth daily. 90 tablet 3   Blood Pressure KIT Check b/p once daily.  Based on insurance preference 1 kit 0   carvedilol  (COREG ) 12.5 MG tablet Take 1 tablet (12.5 mg total) by mouth 2 (two) times daily with a meal. 180 tablet 3   cetirizine  (ZYRTEC ) 10 MG tablet TAKE ONE TABLET BY MOUTH ONCE DAILY 90 tablet 3   diphenhydrAMINE  (BENADRYL ) 25 MG tablet Take 25 mg by mouth daily as needed for allergies.     ezetimibe  (ZETIA ) 10 MG tablet Take 1 tablet (10 mg total) by mouth daily. 30 tablet 11   fenofibrate  (TRICOR ) 145 MG tablet TAKE ONE TABLET BY MOUTH ONCE A DAY 90 tablet 1   ferrous sulfate  325 (65 FE) MG tablet Take 1 tablet (325 mg total) by mouth daily with breakfast. 90 tablet 2   fluticasone  (FLONASE ) 50 MCG/ACT nasal spray Place 1-2 sprays into both nostrils daily as needed for allergies or rhinitis.     glimepiride  (AMARYL ) 1 MG tablet TAKE ONE TABLET BY MOUTH EVERY MORNING WITH BREAKFAST 90 tablet 1   hydroxypropyl methylcellulose / hypromellose (ISOPTO TEARS / GONIOVISC) 2.5 % ophthalmic solution Place 1 drop into both eyes 2 (two) times daily.     magnesium  oxide (MAG-OX) 400 (240 Mg) MG tablet TAKE ONE TABLET (400 MG TOTAL) BY MOUTH DAILY. 90 tablet 1   metFORMIN  (GLUCOPHAGE ) 500 MG tablet TAKE TWO TABLETS BY MOUTH TWICE A DAY WITH A MEAL 360 tablet 1   multivitamin (ONE-A-DAY MEN'S) TABS tablet Take 1 tablet by mouth daily with breakfast.     Omega-3 Fatty Acids (FISH OIL  PO) Take 1 capsule by mouth daily.     pantoprazole  (PROTONIX ) 40 MG tablet TAKE ONE TABLET BY MOUTH ONCE A DAY 90 tablet 1   predniSONE   (DELTASONE ) 20 MG tablet 2 tablet daily x 1 week then 1 tablet daily x 1 week 21 tablet 0   REPATHA  SURECLICK 140 MG/ML SOAJ INJECT 140 MG INTO THE SKIN EVERY 14  DAYS. 6 mL 3   sacubitril -valsartan  (ENTRESTO ) 97-103 MG Take 1 tablet by mouth 2 (two) times daily. 60 tablet 11   No current facility-administered medications on file prior to visit.   Vitals:   03/02/24 0959  BP: 132/73  Pulse: 75  Resp: 18  SpO2: 99%  Weight: 189 lb 2 oz (85.8 kg)   Wt Readings from Last 3 Encounters:  03/02/24 189 lb 2 oz (85.8 kg)  02/13/24 185 lb 4 oz (84 kg)  02/10/24 186 lb (84.4 kg)   Lab Results  Component Value Date   CREATININE 1.24 01/16/2024   CREATININE 1.28 (H) 12/18/2023   CREATININE 1.41 (H) 12/09/2023    Physical Exam: General: Well appearing.  Cor: No JVD. Regular rhythm, rate.  Lungs: clear Abdomen: soft, nontender, nondistended. Extremities: no edema Neuro:. Affect pleasant   Assessment/Plan: 1. Chronic systolic CHF: Long-standing, thought to be nonischemic cardiomyopathy possibly due to prior ETOH.  Initial echo in 11/16 showed EF 25-30%, cath in 2016 with nonobstructive CAD.  By 2021 and 2022 echoes, EF had improved to 45-50%.  Echo done in 1/24 showed significant drop in EF to 25-30% with normal RV, mild MR.  He no longer drinks ETOH and has not smoked for several years.  Repeat cath in 4/24 showed nonobstructive CAD. Cardiac MRI (7/24) with EF 41%, RVEF 49%, subtle mid-wall LGE in the basal septal wall (nonspecific), ECV 33%. Echo 12/25: EF 35-40%, G1DD, normal RV, trivial MR. Not volume overloaded on exam. NYHA [name] I.   - Continue Coreg  12.5 mg bid.  -  Off Farxiga  due to dehydration, says he felt terrible on it and does not want to retry.   - Resume spironolactone  12.5 daily. Titrate slowly due to hx of dehydration/ orthostasis . BMET in 7-10 days and repeat at next OV - Continue Entresto  97/103 bid  - EF out of range for ICD on 12/25. EF 35-40%  2. CKD stage 3: BMET  in 7-10 days. GFR 01/16/24 was 59.95 3. H/o DVT: Related to PICC line, no longer on anticoagulation.  4. CAD: Cath 4/24 with 60% pLAD, 40% D1.   - Continue ASA 81.  - Goal LDL < 55,. LDL 0/15/25 was 27 - Resumed atorvastatin  80mg  daily and zetia  10mg  daily - Continue repatha  injections every 2 weeks   Return in 1 month, sooner if needed.   I spent 35 minutes reviewing records, interviewing/ examing patient and managing plan/ orders.   Travis DELENA Class, FNP 03/01/24

## 2024-03-02 ENCOUNTER — Encounter: Payer: Self-pay | Admitting: Family

## 2024-03-02 ENCOUNTER — Ambulatory Visit: Attending: Family | Admitting: Family

## 2024-03-02 ENCOUNTER — Encounter: Admitting: Cardiology

## 2024-03-02 ENCOUNTER — Telehealth (HOSPITAL_COMMUNITY): Payer: Self-pay | Admitting: *Deleted

## 2024-03-02 VITALS — BP 132/73 | HR 75 | Resp 18 | Wt 189.1 lb

## 2024-03-02 DIAGNOSIS — N183 Chronic kidney disease, stage 3 unspecified: Secondary | ICD-10-CM

## 2024-03-02 DIAGNOSIS — I5022 Chronic systolic (congestive) heart failure: Secondary | ICD-10-CM | POA: Diagnosis not present

## 2024-03-02 DIAGNOSIS — Z86718 Personal history of other venous thrombosis and embolism: Secondary | ICD-10-CM

## 2024-03-02 DIAGNOSIS — I2510 Atherosclerotic heart disease of native coronary artery without angina pectoris: Secondary | ICD-10-CM

## 2024-03-02 MED ORDER — SPIRONOLACTONE 25 MG PO TABS: 12.5000 mg | ORAL_TABLET | Freq: Every day | ORAL | 3 refills | Status: DC

## 2024-03-02 MED ORDER — ATORVASTATIN CALCIUM 80 MG PO TABS: 80.0000 mg | ORAL_TABLET | Freq: Every day | ORAL | 3 refills | Status: AC

## 2024-03-02 MED ORDER — EZETIMIBE 10 MG PO TABS: 10.0000 mg | ORAL_TABLET | Freq: Every day | ORAL | 11 refills | Status: AC

## 2024-03-02 NOTE — Telephone Encounter (Signed)
 Called patient per Dr. Rolan with following echo results:  EF 35-40%, fairly stable compared to prior study (cardiac MRI)  Pt verbalized understanding of same.

## 2024-03-02 NOTE — Patient Instructions (Signed)
 Please return in 1 week to the [facility] (Main Entrance) for lab work.   Medications:  BEGIN taking Spironolactone  12.5 MG (half tablet) once daily This medication and other refills sent to [facility]   Follow Up: 1 month with Ellouise Class, NP

## 2024-03-03 NOTE — Progress Notes (Signed)
 MARGUIS MATHIESON                                          MRN: [id number]   03/03/2024   The [facility]I Quality Team Specialist reviewed this patient medical record for the purposes of chart review for care gap closure. The following were reviewed: abstraction for care gap closure-glycemic status assessment and kidney health evaluation for diabetes:eGFR  and uACR.    VBCI Quality Team

## 2024-04-01 ENCOUNTER — Telehealth: Payer: Self-pay | Admitting: Family

## 2024-04-01 NOTE — Telephone Encounter (Signed)
 Called to confirm/remind patient of their appointment at the [facility] on 1/826.   Appointment:   [x] Confirmed  [] Left mess   [] No answer/No voice mail  [] VM Full/unable to leave message  [] Phone not in service  Patient reminded to bring all medications and/or complete list.  Confirmed patient has transportation. Gave directions, instructed to utilize valet parking.

## 2024-04-01 NOTE — Progress Notes (Signed)
 "  Advanced [facility] Clinic Note    PCP: Wendee Lynwood HERO, NP HF Cardiology: Dr. Rolan  Chief complaint: HF visit   HPI:  [name] is a 69 y.o. male with history of CHF, HTN, CKD stage 3 who was referred to HF MD by Ellouise Class.  Patient has a long history of cardiomyopathy.  In 11/16, echo was done showing EF 25-30%.  Cath at that time showed nonobstructive CAD.  Over the years, his LV function improved, with echoes in 2021 and 2022 showing EF 45-50%. He was a prior heavy drinker but quit several years ago. He is also a prior smoker. ETOH was thought to contribute to his cardiomyopathy. He had a long admission in 10/22 with ruptured appendicitis and sepsis. This admission was also complicated by splenic rupture and splenectomy.  GDMT in recent years appears to have been limited by low BP and renal dysfunction.    In 1/24, he had an echo done that showed EF 25-30% with mild LV dilation and mild LVH. This was a signficant fall from previous years.  He did not report any significant changes around this time. LHC/RHC in 4/24 showed 60% pLAD stenosis, 40% D1; normal filling pressures and CI 2.61.   Patient currently does not smoke cigarettes or drink, he does use marijuana occasionally.  No other drugs.    Cardiac MRI (7/24) with EF 41%, RVEF 49%, subtle mid-wall LGE in the basal septal wall (nonspecific), ECV 33%.   Seen in [facility] 03/02/24 where spironolactone  12.5mg  daily was started.   He presents today for a HF visit. Has been recovering over left sided neck pain. He's unclear as to the cause of his pain. Denies any shortness of breath, fatigue, chest pain, palpitations, dizziness, edema. Able to do all his ADL's, grocery shopping, house cleaning etc without any symptoms. Has been taking spironolactone  as 12.5mg  BID instead of daily as the prescription bottle says 12.5mg  BID. No issues with the medication that he's aware of.    ECG (8/25): NSR, nonspecific inferolateral TWIs.   Labs (2/24):  K 4.3, creatinine 1.42 Labs (5/24): K 4.1 => 4.8, creatinine 1.57 => 1.55, LDL 80 Labs (6/24): K 4.5, creatinine 1.4, BNP 26 Labs (10/24): LDL 71 Labs (9/25): K 5.1=>4.7, creatinine 1.41=>1.28 Labs (10/25): K 4.8, creatinine 1.24, LFT's normal  PMH: 1. Hyperlipidemia 2. Gout 3. HTN 4. GI bleeding 5. Type 2 diabetes 6. H/o ETOH abuse: Quit drinking in 2022.  7. Central retinal artery occlusion: Left eye, 2015.  8. CKD stage 3 9. Appendicitis: 10/22 hospitalization with ruptured appendix, also complicated by splenic rupture needing splenectomy.  10. H/o LUE DVT: Thought to be related to PICC.  11. H/o TIA 12. Chronic systolic CHF: Echo in 11/16 with EF 25-30%, cath in 2016 and 4/24with nonobstructive CAD.  - Echo (5/18): EF 35-40%.  - Echo (3/21): EF 35-40% - Echo (8/21): EF 45-50% - Echo (10/22): EF 45-50%, mild MR - Echo (3/24): EF 25-30%, mild LV dilation, mild LVH, normal RV, mild MR.  - Echo (12/25): EF 35-40%, G1DD, normal RV, trivial MR - RHC (4/24): mean RA 5, PA 36/12, mean PCWP 9, CI 2.61 - Cardiac MRI (7/24) with EF 41%, RVEF 49%, subtle mid-wall LGE in the basal septal wall (nonspecific), ECV 33%.  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 CAD: Cath (4/24) with 60% pLAD, 40% D1.   Social History   Socioeconomic History   Marital status: Single    Spouse name: Not on  file   Number of children: 0   Years of education: 12   Highest education level: 12th grade  Occupational History   Occupation: Retired  Tobacco Use   Smoking status: Former    Current packs/day: 0.00    Average packs/day: 0.8 packs/day for 30.0 years (22.5 ttl pk-yrs)    Types: Cigarettes    Start date: 12/1990    Quit date: 12/2020    Years since quitting: 3.2   Smokeless tobacco: Never  Vaping Use   Vaping status: Never Used  Substance and Sexual Activity   Alcohol  use: Not Currently   Drug use: Not Currently   Sexual activity: Yes    Birth control/protection: Condom  Other  Topics Concern   Not on file  Social History Narrative   Not on file   Social Drivers of Health   Tobacco Use: Medium Risk (03/02/2024)   Patient History    Smoking Tobacco Use: Former    Smokeless Tobacco Use: Never    Passive Exposure: Not on Actuary Strain: Low Risk (01/18/2024)   Overall Financial Resource Strain (CARDIA)    Difficulty of Paying Living Expenses: Not hard at all  Food Insecurity: No Food Insecurity (01/18/2024)   Epic    Worried About Radiation Protection Practitioner of Food in the Last Year: Never true    Ran Out of Food in the Last Year: Never true  Transportation Needs: No Transportation Needs (01/18/2024)   Epic    Lack of Transportation (Medical): No    Lack of Transportation (Non-Medical): No  Physical Activity: Sufficiently Active (01/18/2024)   Exercise Vital Sign    Days of Exercise per Week: 6 days    Minutes of Exercise per Session: 40 min  Stress: No Stress Concern Present (01/18/2024)   [employer] of Occupational Health - Occupational Stress Questionnaire    Feeling of Stress: Only a little  Social Connections: Moderately Isolated (01/18/2024)   Social Connection and Isolation Panel    Frequency of Communication with Friends and Family: More than three times a week    Frequency of Social Gatherings with Friends and Family: More than three times a week    Attends Religious Services: 1 to 4 times per year    Active Member of [employer] or Organizations: No    Attends Engineer, Structural: Not on file    Marital Status: Never married  Intimate Partner Violence: Not At Risk (08/06/2023)   Humiliation, Afraid, Rape, and Kick questionnaire    Fear of Current or Ex-Partner: No    Emotionally Abused: No    Physically Abused: No    Sexually Abused: No  Depression (PHQ2-9): Low Risk (08/06/2023)   Depression (PHQ2-9)    PHQ-2 Score: 0  Alcohol  Screen: Low Risk (08/06/2023)   Alcohol  Screen    Last Alcohol  Screening Score (AUDIT): 0  Housing:  Low Risk (01/18/2024)   Epic    Unable to Pay for Housing in the Last Year: No    Number of Times Moved in the Last Year: 0    Homeless in the Last Year: No  Utilities: Not At Risk (08/06/2023)   AHC Utilities    Threatened with loss of utilities: No  Health Literacy: Adequate Health Literacy (08/06/2023)   B1300 Health Literacy    Frequency of need for help with medical instructions: Never   Family History  Problem Relation Age of Onset   Diabetes Mother    Hypertension Mother    Diabetes Father  Hypertension Father    Diabetes Sister    Dementia Brother    Cancer Neg Hx    Heart disease Neg Hx    Stroke Neg Hx    Colon cancer Neg Hx    Esophageal cancer Neg Hx    Rectal cancer Neg Hx    Stomach cancer Neg Hx    ROS: All systems reviewed and negative except as per HPI.   Current Outpatient Medications on File Prior to Visit  Medication Sig Dispense Refill   acetaminophen  (TYLENOL ) 500 MG tablet Take 2 tablets (1,000 mg total) by mouth every 8 (eight) hours as needed. 30 tablet 0   albuterol  (VENTOLIN  HFA) 108 (90 Base) MCG/ACT inhaler Inhale 2 puffs into the lungs every 6 (six) hours as needed for wheezing or shortness of breath.     allopurinol  (ZYLOPRIM ) 100 MG tablet TAKE ONE TABLET BY MOUTH TWICE A DAY 180 tablet 1   ASPIRIN  81 PO      atorvastatin  (LIPITOR) 80 MG tablet Take 1 tablet (80 mg total) by mouth daily. 90 tablet 3   Blood Pressure KIT Check b/p once daily. Based on insurance preference 1 kit 0   carvedilol  (COREG ) 12.5 MG tablet Take 1 tablet (12.5 mg total) by mouth 2 (two) times daily with a meal. 180 tablet 3   cetirizine  (ZYRTEC ) 10 MG tablet TAKE ONE TABLET BY MOUTH ONCE DAILY 90 tablet 3   diphenhydrAMINE  (BENADRYL ) 25 MG tablet Take 25 mg by mouth daily as needed for allergies.     ezetimibe  (ZETIA ) 10 MG tablet Take 1 tablet (10 mg total) by mouth daily. 30 tablet 11   fenofibrate  (TRICOR ) 145 MG tablet TAKE ONE TABLET BY MOUTH ONCE A DAY 90 tablet 1    ferrous sulfate  325 (65 FE) MG tablet Take 1 tablet (325 mg total) by mouth daily with breakfast. 90 tablet 2   fluticasone  (FLONASE ) 50 MCG/ACT nasal spray Place 1-2 sprays into both nostrils daily as needed for allergies or rhinitis.     glimepiride  (AMARYL ) 1 MG tablet TAKE ONE TABLET BY MOUTH EVERY MORNING WITH BREAKFAST 90 tablet 1   hydroxypropyl methylcellulose / hypromellose (ISOPTO TEARS / GONIOVISC) 2.5 % ophthalmic solution Place 1 drop into both eyes 2 (two) times daily.     magnesium  oxide (MAG-OX) 400 (240 Mg) MG tablet TAKE ONE TABLET (400 MG TOTAL) BY MOUTH DAILY. 90 tablet 1   multivitamin (ONE-A-DAY MEN'S) TABS tablet Take 1 tablet by mouth daily with breakfast.     Omega-3 Fatty Acids (FISH OIL  PO) Take 1 capsule by mouth daily.     pantoprazole  (PROTONIX ) 40 MG tablet TAKE ONE TABLET BY MOUTH ONCE A DAY (Patient not taking: Reported on 03/02/2024) 90 tablet 1   REPATHA  SURECLICK 140 MG/ML SOAJ INJECT 140 MG INTO THE SKIN EVERY 14  DAYS. 6 mL 3   sacubitril -valsartan  (ENTRESTO ) 97-103 MG Take 1 tablet by mouth 2 (two) times daily. 60 tablet 11   spironolactone  (ALDACTONE ) 25 MG tablet Take 0.5 tablets (12.5 mg total) by mouth daily. 45 tablet 3   No current facility-administered medications on file prior to visit.   Vitals:   04/02/24 1305 04/02/24 1335  BP: (!) 150/84 (!) 148/86  Pulse: 74   SpO2: 98%   Weight: 192 lb 6 oz (87.3 kg)    Wt Readings from Last 3 Encounters:  04/02/24 192 lb 6 oz (87.3 kg)  03/02/24 189 lb 2 oz (85.8 kg)  02/13/24 185 lb 4  oz (84 kg)   Lab Results  Component Value Date   CREATININE 1.24 01/16/2024   CREATININE 1.28 (H) 12/18/2023   CREATININE 1.41 (H) 12/09/2023     Physical Exam: General: Well appearing although appears uncomfortable  Cor: No JVD. Regular rhythm, rate.  Lungs: clear Abdomen: soft, nontender, nondistended. Extremities: no edema Neuro:. Affect pleasant   Assessment/Plan: 1. Chronic systolic CHF:  Long-standing, thought to be nonischemic cardiomyopathy possibly due to prior ETOH.  Initial echo in 11/16 showed EF 25-30%, cath in 2016 with nonobstructive CAD.  By 2021 and 2022 echoes, EF had improved to 45-50%.  Echo done in 1/24 showed significant drop in EF to 25-30% with normal RV, mild MR.  He no longer drinks ETOH and has not smoked for several years.  Repeat cath in 4/24 showed nonobstructive CAD. Cardiac MRI (7/24) with EF 41%, RVEF 49%, subtle mid-wall LGE in the basal septal wall (nonspecific), ECV 33%. Echo 12/25: EF 35-40%, G1DD, normal RV, trivial MR. Not volume overloaded on exam. NYHA class I.   - Continue Coreg  12.5 mg bid.  - Off Farxiga  due to dehydration, says he felt terrible on it and does not want to retry.   - Continue Entresto  97/103 bid  - Change spironolactone  to 25 daily as he's been taking 12.5mg  BID (prescription was inadvertently sent in as 12.5mg  BID) - BMET today - EF out of range for ICD on 12/25. EF 35-40%  - weight up 3 pounds from last visit here 1 month ago 2. CKD stage 3: BMET today. GFR 01/16/24 was 59.95 3. H/o DVT: Related to PICC line, no longer on anticoagulation.  4. CAD: Cath 4/24 with 60% pLAD, 40% D1.   - Continue ASA 81.  - Goal LDL < 55,. LDL 0/15/25 was 27 - Continue atorvastatin  80mg  daily and zetia  10mg  daily - Continue repatha  injections every 2 weeks 5: HTN: - BP mildly elevated even after recheck (148/86) - having a lot of neck pain which may be contributing to this - BP log provided and instructed him to check his BP a few times / week and bring BP log back with him   Return in 1 month, sooner if needed.   I spent 30 minutes reviewing records, interviewing/ examing patient and managing plan/ orders.   Ellouise DELENA Class, FNP 04/01/2024  "

## 2024-04-02 ENCOUNTER — Ambulatory Visit: Attending: Family | Admitting: Family

## 2024-04-02 ENCOUNTER — Encounter: Payer: Self-pay | Admitting: Family

## 2024-04-02 VITALS — BP 148/86 | HR 74 | Wt 192.4 lb

## 2024-04-02 DIAGNOSIS — F1091 Alcohol use, unspecified, in remission: Secondary | ICD-10-CM | POA: Insufficient documentation

## 2024-04-02 DIAGNOSIS — I130 Hypertensive heart and chronic kidney disease with heart failure and stage 1 through stage 4 chronic kidney disease, or unspecified chronic kidney disease: Secondary | ICD-10-CM | POA: Insufficient documentation

## 2024-04-02 DIAGNOSIS — N183 Chronic kidney disease, stage 3 unspecified: Secondary | ICD-10-CM | POA: Insufficient documentation

## 2024-04-02 DIAGNOSIS — I10 Essential (primary) hypertension: Secondary | ICD-10-CM | POA: Diagnosis not present

## 2024-04-02 DIAGNOSIS — I2510 Atherosclerotic heart disease of native coronary artery without angina pectoris: Secondary | ICD-10-CM | POA: Insufficient documentation

## 2024-04-02 DIAGNOSIS — Z87891 Personal history of nicotine dependence: Secondary | ICD-10-CM | POA: Insufficient documentation

## 2024-04-02 DIAGNOSIS — I5022 Chronic systolic (congestive) heart failure: Secondary | ICD-10-CM | POA: Insufficient documentation

## 2024-04-02 DIAGNOSIS — Z86718 Personal history of other venous thrombosis and embolism: Secondary | ICD-10-CM | POA: Diagnosis not present

## 2024-04-02 DIAGNOSIS — Z7984 Long term (current) use of oral hypoglycemic drugs: Secondary | ICD-10-CM | POA: Insufficient documentation

## 2024-04-02 DIAGNOSIS — Z7982 Long term (current) use of aspirin: Secondary | ICD-10-CM | POA: Insufficient documentation

## 2024-04-02 DIAGNOSIS — Z79899 Other long term (current) drug therapy: Secondary | ICD-10-CM | POA: Insufficient documentation

## 2024-04-02 DIAGNOSIS — E1122 Type 2 diabetes mellitus with diabetic chronic kidney disease: Secondary | ICD-10-CM | POA: Insufficient documentation

## 2024-04-02 DIAGNOSIS — I428 Other cardiomyopathies: Secondary | ICD-10-CM | POA: Diagnosis not present

## 2024-04-02 LAB — BASIC METABOLIC PANEL WITH GFR
BUN/Creatinine Ratio: 14 (ref 10–24)
BUN: 16 mg/dL (ref 8–27)
CO2: 22 mmol/L (ref 20–29)
Calcium: 10 mg/dL (ref 8.6–10.2)
Chloride: 104 mmol/L (ref 96–106)
Creatinine, Ser: 1.11 mg/dL (ref 0.76–1.27)
Glucose: 80 mg/dL (ref 70–99)
Potassium: 4.5 mmol/L (ref 3.5–5.2)
Sodium: 142 mmol/L (ref 134–144)
eGFR: 72 mL/min/1.73

## 2024-04-02 MED ORDER — SPIRONOLACTONE 25 MG PO TABS: 25.0000 mg | ORAL_TABLET | Freq: Every day | ORAL | 3 refills | Status: AC

## 2024-04-02 NOTE — Patient Instructions (Signed)
 Medication Changes:  STOP Spironolactone  12.5 MG (1/2 tablet)  START Spironolactone  25 MG (one tablet) daily  Lab Work:  Labs done today, your results will be available in MyChart, we will contact you for abnormal readings.    Special Instructions // Education:  Do the following things EVERYDAY: Weigh yourself in the morning before breakfast. Write it down and keep it in a log. Take your medicines as prescribed Eat low salt foods--Limit salt (sodium) to 2000 mg per day.  Stay as active as you can everyday Limit all fluids for the day to less than 2 liters   Follow-Up in: 1 month with Ellouise Class, NP    If you have any questions or concerns before your next appointment please send us  a message through [place] or call our office at [phone], If it is after office hours your call will be answered by our answering service and directed appropriately.     At the [facility], you and your health needs are our priority. We have a designated team specialized in the treatment of Heart Failure. This Care Team includes your primary Heart Failure Specialized Cardiologist (physician), Advanced Practice Providers (APPs- Physician Assistants and Nurse Practitioners), and Pharmacist who all work together to provide you with the care you need, when you need it.   You may see any of the following providers on your designated Care Team at your next follow up:  Dr. Toribio Fuel Dr. Ezra Shuck Dr. Ria Commander Dr. Odis Brownie Greig Mosses, NP Caffie Shed, [place] [address] [place], [place] Beckey Coe, NP Jordan Lee, NP Ellouise Class, NP Jaun Bash, PharmD

## 2024-04-03 ENCOUNTER — Ambulatory Visit: Payer: Self-pay | Admitting: Family

## 2024-04-07 ENCOUNTER — Other Ambulatory Visit: Payer: Self-pay | Admitting: Nurse Practitioner

## 2024-05-01 ENCOUNTER — Telehealth: Payer: Self-pay | Admitting: Family

## 2024-05-01 NOTE — Telephone Encounter (Signed)
 Called to confirm/remind patient of their appointment at the [facility] on 05/04/24.   Appointment:   [x] Confirmed  [] Left mess   [] No answer/No voice mail  [] VM Full/unable to leave message  [] Phone not in service  Patient reminded to bring all medications and/or complete list.  Confirmed patient has transportation. Gave directions, instructed to utilize valet parking.

## 2024-05-04 ENCOUNTER — Ambulatory Visit: Admitting: Family

## 2024-07-16 ENCOUNTER — Ambulatory Visit: Admitting: Nurse Practitioner

## 2024-08-06 ENCOUNTER — Ambulatory Visit

## 2024-08-14 ENCOUNTER — Ambulatory Visit
